# Patient Record
Sex: Female | Born: 1945 | Race: White | Hispanic: No | Smoking: Current some day smoker
Health system: Southern US, Community
[De-identification: ages and names within clinical notes are randomized; demographics above are authoritative.]

## PROBLEM LIST (undated history)

## (undated) DIAGNOSIS — T4145XA Adverse effect of unspecified anesthetic, initial encounter: Secondary | ICD-10-CM

## (undated) DIAGNOSIS — E785 Hyperlipidemia, unspecified: Secondary | ICD-10-CM

## (undated) DIAGNOSIS — S46009A Unspecified injury of muscle(s) and tendon(s) of the rotator cuff of unspecified shoulder, initial encounter: Secondary | ICD-10-CM

## (undated) DIAGNOSIS — F419 Anxiety disorder, unspecified: Secondary | ICD-10-CM

## (undated) DIAGNOSIS — F329 Major depressive disorder, single episode, unspecified: Secondary | ICD-10-CM

## (undated) DIAGNOSIS — M48061 Spinal stenosis, lumbar region without neurogenic claudication: Secondary | ICD-10-CM

## (undated) DIAGNOSIS — B829 Intestinal parasitism, unspecified: Secondary | ICD-10-CM

## (undated) DIAGNOSIS — K224 Dyskinesia of esophagus: Secondary | ICD-10-CM

## (undated) DIAGNOSIS — R7982 Elevated C-reactive protein (CRP): Secondary | ICD-10-CM

## (undated) DIAGNOSIS — K449 Diaphragmatic hernia without obstruction or gangrene: Secondary | ICD-10-CM

## (undated) DIAGNOSIS — F32A Depression, unspecified: Secondary | ICD-10-CM

## (undated) DIAGNOSIS — J449 Chronic obstructive pulmonary disease, unspecified: Secondary | ICD-10-CM

## (undated) DIAGNOSIS — I739 Peripheral vascular disease, unspecified: Secondary | ICD-10-CM

## (undated) DIAGNOSIS — IMO0001 Reserved for inherently not codable concepts without codable children: Secondary | ICD-10-CM

## (undated) DIAGNOSIS — I499 Cardiac arrhythmia, unspecified: Secondary | ICD-10-CM

## (undated) DIAGNOSIS — H269 Unspecified cataract: Secondary | ICD-10-CM

## (undated) DIAGNOSIS — S329XXA Fracture of unspecified parts of lumbosacral spine and pelvis, initial encounter for closed fracture: Secondary | ICD-10-CM

## (undated) DIAGNOSIS — E039 Hypothyroidism, unspecified: Secondary | ICD-10-CM

## (undated) DIAGNOSIS — G5600 Carpal tunnel syndrome, unspecified upper limb: Secondary | ICD-10-CM

## (undated) DIAGNOSIS — K219 Gastro-esophageal reflux disease without esophagitis: Secondary | ICD-10-CM

## (undated) DIAGNOSIS — T8859XA Other complications of anesthesia, initial encounter: Secondary | ICD-10-CM

## (undated) DIAGNOSIS — I2510 Atherosclerotic heart disease of native coronary artery without angina pectoris: Secondary | ICD-10-CM

## (undated) DIAGNOSIS — T884XXA Failed or difficult intubation, initial encounter: Secondary | ICD-10-CM

## (undated) DIAGNOSIS — R790 Abnormal level of blood mineral: Secondary | ICD-10-CM

## (undated) DIAGNOSIS — Z8711 Personal history of peptic ulcer disease: Secondary | ICD-10-CM

## (undated) DIAGNOSIS — R700 Elevated erythrocyte sedimentation rate: Secondary | ICD-10-CM

## (undated) DIAGNOSIS — E0590 Thyrotoxicosis, unspecified without thyrotoxic crisis or storm: Secondary | ICD-10-CM

## (undated) DIAGNOSIS — I10 Essential (primary) hypertension: Secondary | ICD-10-CM

## (undated) DIAGNOSIS — R0600 Dyspnea, unspecified: Secondary | ICD-10-CM

## (undated) HISTORY — PX: APPENDECTOMY: SHX54

## (undated) HISTORY — PX: FRACTURE SURGERY: SHX138

## (undated) HISTORY — DX: Elevated C-reactive protein (CRP): R79.82

## (undated) HISTORY — DX: Dyskinesia of esophagus: K22.4

## (undated) HISTORY — DX: Personal history of peptic ulcer disease: Z87.11

## (undated) HISTORY — PX: CARPAL TUNNEL RELEASE: SHX101

## (undated) HISTORY — DX: Unspecified injury of muscle(s) and tendon(s) of the rotator cuff of unspecified shoulder, initial encounter: S46.009A

## (undated) HISTORY — DX: Hypothyroidism, unspecified: E03.9

## (undated) HISTORY — DX: Depression, unspecified: F32.A

## (undated) HISTORY — DX: Atherosclerotic heart disease of native coronary artery without angina pectoris: I25.10

## (undated) HISTORY — PX: ROTATOR CUFF REPAIR: SHX139

## (undated) HISTORY — PX: NECK SURGERY: SHX720

## (undated) HISTORY — DX: Hyperlipidemia, unspecified: E78.5

## (undated) HISTORY — DX: Gastro-esophageal reflux disease without esophagitis: K21.9

## (undated) HISTORY — PX: BACK SURGERY: SHX140

## (undated) HISTORY — PX: NOSE SURGERY: SHX723

## (undated) HISTORY — DX: Thyrotoxicosis, unspecified without thyrotoxic crisis or storm: E05.90

## (undated) HISTORY — PX: HIP SURGERY: SHX245

## (undated) HISTORY — DX: Anxiety disorder, unspecified: F41.9

## (undated) HISTORY — DX: Unspecified cataract: H26.9

## (undated) HISTORY — PX: CHOLECYSTECTOMY: SHX55

## (undated) HISTORY — DX: Carpal tunnel syndrome, unspecified upper limb: G56.00

## (undated) HISTORY — DX: Elevated erythrocyte sedimentation rate: R70.0

## (undated) HISTORY — DX: Reserved for inherently not codable concepts without codable children: IMO0001

## (undated) HISTORY — DX: Major depressive disorder, single episode, unspecified: F32.9

## (undated) HISTORY — DX: Fracture of unspecified parts of lumbosacral spine and pelvis, initial encounter for closed fracture: S32.9XXA

## (undated) HISTORY — DX: Intestinal parasitism, unspecified: B82.9

## (undated) HISTORY — DX: Diaphragmatic hernia without obstruction or gangrene: K44.9

## (undated) HISTORY — DX: Abnormal level of blood mineral: R79.0

---

## 1999-09-26 ENCOUNTER — Ambulatory Visit (HOSPITAL_COMMUNITY): Admission: RE | Admit: 1999-09-26 | Discharge: 1999-09-26 | Payer: Self-pay | Admitting: Gastroenterology

## 1999-09-26 ENCOUNTER — Encounter: Payer: Self-pay | Admitting: Gastroenterology

## 2002-01-03 ENCOUNTER — Ambulatory Visit (HOSPITAL_BASED_OUTPATIENT_CLINIC_OR_DEPARTMENT_OTHER): Admission: RE | Admit: 2002-01-03 | Discharge: 2002-01-03 | Payer: Self-pay | Admitting: Orthopedic Surgery

## 2004-03-29 ENCOUNTER — Ambulatory Visit (HOSPITAL_COMMUNITY): Admission: RE | Admit: 2004-03-29 | Discharge: 2004-03-29 | Payer: Self-pay | Admitting: Sports Medicine

## 2004-04-26 ENCOUNTER — Inpatient Hospital Stay (HOSPITAL_COMMUNITY): Admission: RE | Admit: 2004-04-26 | Discharge: 2004-04-27 | Payer: Self-pay | Admitting: Neurological Surgery

## 2005-04-21 ENCOUNTER — Ambulatory Visit (HOSPITAL_COMMUNITY): Admission: RE | Admit: 2005-04-21 | Discharge: 2005-04-21 | Payer: Self-pay | Admitting: Neurological Surgery

## 2005-05-04 ENCOUNTER — Inpatient Hospital Stay (HOSPITAL_COMMUNITY): Admission: RE | Admit: 2005-05-04 | Discharge: 2005-05-06 | Payer: Self-pay | Admitting: Neurological Surgery

## 2005-06-13 ENCOUNTER — Ambulatory Visit (HOSPITAL_COMMUNITY): Admission: RE | Admit: 2005-06-13 | Discharge: 2005-06-13 | Payer: Self-pay | Admitting: Neurological Surgery

## 2005-07-28 ENCOUNTER — Ambulatory Visit (HOSPITAL_COMMUNITY): Admission: RE | Admit: 2005-07-28 | Discharge: 2005-07-28 | Payer: Self-pay | Admitting: Otolaryngology

## 2005-07-28 ENCOUNTER — Ambulatory Visit (HOSPITAL_BASED_OUTPATIENT_CLINIC_OR_DEPARTMENT_OTHER): Admission: RE | Admit: 2005-07-28 | Discharge: 2005-07-29 | Payer: Self-pay | Admitting: Otolaryngology

## 2006-04-25 ENCOUNTER — Ambulatory Visit: Payer: Self-pay | Admitting: Anesthesiology

## 2006-05-03 ENCOUNTER — Ambulatory Visit: Payer: Self-pay | Admitting: Pain Medicine

## 2006-05-09 ENCOUNTER — Ambulatory Visit: Payer: Self-pay | Admitting: Pain Medicine

## 2006-05-10 ENCOUNTER — Ambulatory Visit: Payer: Self-pay | Admitting: Pain Medicine

## 2006-05-16 ENCOUNTER — Ambulatory Visit: Payer: Self-pay | Admitting: Pain Medicine

## 2006-05-29 ENCOUNTER — Ambulatory Visit: Payer: Self-pay | Admitting: Physician Assistant

## 2006-06-08 ENCOUNTER — Ambulatory Visit: Payer: Self-pay | Admitting: Physician Assistant

## 2006-06-21 ENCOUNTER — Ambulatory Visit: Payer: Self-pay | Admitting: Pain Medicine

## 2006-07-05 ENCOUNTER — Ambulatory Visit: Payer: Self-pay | Admitting: Pain Medicine

## 2006-08-06 ENCOUNTER — Ambulatory Visit: Payer: Self-pay | Admitting: Physician Assistant

## 2006-09-05 ENCOUNTER — Ambulatory Visit: Payer: Self-pay | Admitting: Physician Assistant

## 2006-09-26 ENCOUNTER — Ambulatory Visit: Payer: Self-pay | Admitting: Physician Assistant

## 2006-10-03 ENCOUNTER — Ambulatory Visit: Payer: Self-pay | Admitting: Pain Medicine

## 2006-10-08 ENCOUNTER — Ambulatory Visit: Payer: Self-pay | Admitting: Physician Assistant

## 2006-11-08 ENCOUNTER — Ambulatory Visit: Payer: Self-pay | Admitting: Physician Assistant

## 2006-11-20 DIAGNOSIS — S329XXA Fracture of unspecified parts of lumbosacral spine and pelvis, initial encounter for closed fracture: Secondary | ICD-10-CM

## 2006-11-20 HISTORY — DX: Fracture of unspecified parts of lumbosacral spine and pelvis, initial encounter for closed fracture: S32.9XXA

## 2006-12-10 ENCOUNTER — Ambulatory Visit: Payer: Self-pay | Admitting: Physician Assistant

## 2007-01-07 ENCOUNTER — Ambulatory Visit: Payer: Self-pay | Admitting: Physician Assistant

## 2007-01-14 ENCOUNTER — Inpatient Hospital Stay: Payer: Self-pay | Admitting: Internal Medicine

## 2007-01-14 ENCOUNTER — Encounter: Admission: RE | Admit: 2007-01-14 | Discharge: 2007-01-14 | Payer: Self-pay | Admitting: Gastroenterology

## 2007-02-04 ENCOUNTER — Ambulatory Visit: Payer: Self-pay | Admitting: Physician Assistant

## 2007-03-04 ENCOUNTER — Ambulatory Visit: Payer: Self-pay | Admitting: Physician Assistant

## 2007-04-04 ENCOUNTER — Ambulatory Visit: Payer: Self-pay | Admitting: Physician Assistant

## 2007-05-06 ENCOUNTER — Ambulatory Visit: Payer: Self-pay | Admitting: Physician Assistant

## 2007-06-04 ENCOUNTER — Ambulatory Visit: Payer: Self-pay | Admitting: Physician Assistant

## 2007-06-11 ENCOUNTER — Encounter: Admission: RE | Admit: 2007-06-11 | Discharge: 2007-06-11 | Payer: Self-pay | Admitting: Gastroenterology

## 2007-07-02 ENCOUNTER — Ambulatory Visit: Payer: Self-pay | Admitting: Physician Assistant

## 2007-08-05 ENCOUNTER — Ambulatory Visit: Payer: Self-pay | Admitting: Physician Assistant

## 2007-09-04 ENCOUNTER — Ambulatory Visit: Payer: Self-pay | Admitting: Physician Assistant

## 2007-10-02 ENCOUNTER — Ambulatory Visit: Payer: Self-pay | Admitting: Physician Assistant

## 2007-12-31 ENCOUNTER — Ambulatory Visit: Payer: Self-pay | Admitting: Physician Assistant

## 2008-04-02 ENCOUNTER — Ambulatory Visit: Payer: Self-pay | Admitting: Physician Assistant

## 2008-07-02 ENCOUNTER — Ambulatory Visit: Payer: Self-pay | Admitting: Physician Assistant

## 2008-09-02 ENCOUNTER — Encounter: Admission: RE | Admit: 2008-09-02 | Discharge: 2008-09-02 | Payer: Self-pay | Admitting: Gastroenterology

## 2008-09-10 ENCOUNTER — Encounter: Admission: RE | Admit: 2008-09-10 | Discharge: 2008-09-10 | Payer: Self-pay | Admitting: Gastroenterology

## 2008-09-30 ENCOUNTER — Ambulatory Visit: Payer: Self-pay | Admitting: Physician Assistant

## 2008-10-27 ENCOUNTER — Emergency Department (HOSPITAL_COMMUNITY): Admission: EM | Admit: 2008-10-27 | Discharge: 2008-10-27 | Payer: Self-pay | Admitting: Family Medicine

## 2008-10-27 ENCOUNTER — Ambulatory Visit (HOSPITAL_COMMUNITY): Admission: RE | Admit: 2008-10-27 | Discharge: 2008-10-27 | Payer: Self-pay | Admitting: Otolaryngology

## 2008-10-29 ENCOUNTER — Encounter (INDEPENDENT_AMBULATORY_CARE_PROVIDER_SITE_OTHER): Payer: Self-pay | Admitting: Gastroenterology

## 2008-10-29 ENCOUNTER — Ambulatory Visit (HOSPITAL_COMMUNITY): Admission: RE | Admit: 2008-10-29 | Discharge: 2008-10-29 | Payer: Self-pay | Admitting: Gastroenterology

## 2008-12-02 ENCOUNTER — Encounter: Admission: RE | Admit: 2008-12-02 | Discharge: 2008-12-02 | Payer: Self-pay | Admitting: Gastroenterology

## 2008-12-07 ENCOUNTER — Ambulatory Visit (HOSPITAL_COMMUNITY): Admission: RE | Admit: 2008-12-07 | Discharge: 2008-12-07 | Payer: Self-pay | Admitting: Otolaryngology

## 2008-12-29 ENCOUNTER — Ambulatory Visit: Payer: Self-pay | Admitting: Physician Assistant

## 2009-02-11 ENCOUNTER — Ambulatory Visit (HOSPITAL_COMMUNITY): Admission: RE | Admit: 2009-02-11 | Discharge: 2009-02-11 | Payer: Self-pay | Admitting: Gastroenterology

## 2009-03-04 ENCOUNTER — Ambulatory Visit (HOSPITAL_COMMUNITY): Admission: RE | Admit: 2009-03-04 | Discharge: 2009-03-04 | Payer: Self-pay | Admitting: Gastroenterology

## 2009-03-25 ENCOUNTER — Ambulatory Visit: Payer: Self-pay | Admitting: Physician Assistant

## 2009-04-29 ENCOUNTER — Ambulatory Visit: Payer: Self-pay | Admitting: Physician Assistant

## 2009-07-01 ENCOUNTER — Ambulatory Visit (HOSPITAL_COMMUNITY): Admission: RE | Admit: 2009-07-01 | Discharge: 2009-07-01 | Payer: Self-pay | Admitting: Gastroenterology

## 2009-07-27 ENCOUNTER — Ambulatory Visit: Payer: Self-pay | Admitting: Physician Assistant

## 2009-10-26 ENCOUNTER — Ambulatory Visit: Payer: Self-pay | Admitting: Physician Assistant

## 2009-12-03 ENCOUNTER — Encounter: Payer: Self-pay | Admitting: Cardiovascular Disease

## 2009-12-08 ENCOUNTER — Encounter: Payer: Self-pay | Admitting: Cardiovascular Disease

## 2010-01-24 ENCOUNTER — Ambulatory Visit: Payer: Self-pay | Admitting: Pain Medicine

## 2010-02-24 ENCOUNTER — Ambulatory Visit: Payer: Self-pay | Admitting: Pain Medicine

## 2010-03-03 ENCOUNTER — Ambulatory Visit (HOSPITAL_BASED_OUTPATIENT_CLINIC_OR_DEPARTMENT_OTHER): Admission: RE | Admit: 2010-03-03 | Discharge: 2010-03-03 | Payer: Self-pay | Admitting: Orthopedic Surgery

## 2010-08-03 ENCOUNTER — Encounter: Payer: Self-pay | Admitting: Cardiovascular Disease

## 2010-08-24 ENCOUNTER — Ambulatory Visit: Payer: Self-pay | Admitting: Cardiovascular Disease

## 2010-08-24 DIAGNOSIS — R000 Tachycardia, unspecified: Secondary | ICD-10-CM | POA: Insufficient documentation

## 2010-08-24 DIAGNOSIS — R0602 Shortness of breath: Secondary | ICD-10-CM | POA: Insufficient documentation

## 2010-08-24 DIAGNOSIS — E785 Hyperlipidemia, unspecified: Secondary | ICD-10-CM | POA: Insufficient documentation

## 2010-11-02 ENCOUNTER — Ambulatory Visit: Payer: Self-pay | Admitting: Cardiovascular Disease

## 2010-11-22 ENCOUNTER — Encounter: Payer: Self-pay | Admitting: Cardiovascular Disease

## 2010-11-25 ENCOUNTER — Encounter: Payer: Self-pay | Admitting: Cardiovascular Disease

## 2010-11-25 ENCOUNTER — Ambulatory Visit: Admission: RE | Admit: 2010-11-25 | Discharge: 2010-11-25 | Payer: Self-pay | Source: Home / Self Care

## 2010-11-25 DIAGNOSIS — E039 Hypothyroidism, unspecified: Secondary | ICD-10-CM | POA: Insufficient documentation

## 2010-11-28 LAB — CONVERTED CEMR LAB
Bilirubin, Direct: 0.1 mg/dL (ref 0.0–0.3)
Indirect Bilirubin: 0.2 mg/dL (ref 0.0–0.9)
LDL Cholesterol: 151 mg/dL — ABNORMAL HIGH (ref 0–99)
Total Bilirubin: 0.3 mg/dL (ref 0.3–1.2)
Total CHOL/HDL Ratio: 4.4
Triglycerides: 149 mg/dL (ref <150)

## 2010-12-11 ENCOUNTER — Encounter: Payer: Self-pay | Admitting: Otolaryngology

## 2010-12-11 ENCOUNTER — Encounter: Payer: Self-pay | Admitting: Gastroenterology

## 2010-12-11 ENCOUNTER — Encounter: Payer: Self-pay | Admitting: Neurological Surgery

## 2010-12-15 ENCOUNTER — Ambulatory Visit: Payer: Self-pay | Admitting: Neurological Surgery

## 2010-12-20 NOTE — Assessment & Plan Note (Signed)
Summary: NP6/AMD   Visit Type:  Initial Consult Primary Provider:  Katherina Right Tate,M.D.  CC:  c/o irreg. heart beats with shortness of breath and has thyroid issues..  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  She reports that she has continued shortness of breath. She has noted an elevated heart rate using her wrist monitor. She was seen recently by her endocrinologist, Dr. Renae Fickle who recommended that she followup with [place] giving her shortness of breath. Her thyroid dose has been recently decreased her last check in late August showed a TSH 0.25. she does not take inhalers.  she denies any chest pain. She walks for 1-1/2 miles on the treadmill and has no symptoms apart from shortness of breath. 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  EKG shows normal sinus rhythm/sinus tachycardia with rate of 102 beats per minute, no significant ST or T wave changes  Preventive Screening-Counseling & Management  Alcohol-Tobacco     Smoking Status: quit  Caffeine-Diet-Exercise     Does Patient Exercise: yes  Current Medications (verified): 1)  Synthroid 88 Mcg Tabs (Levothyroxine Sodium) .... One Tablet Once Daily 2)  Niaspan 1000 Mg Cr-Tabs (Niacin (Antihyperlipidemic)) .... One Tablet Once Daily 3)  Singulair 10 Mg Tabs (Montelukast Sodium) .... Daily 4)  Trazodone Hcl 150 Mg Tabs (Trazodone Hcl) .... One Tablet At Bedtime 5)  Xanax 1 Mg Tabs (Alprazolam) .... As Needed 6)  Bupropion Hcl 100 Mg  Tabs (Bupropion Hcl) .... Daily  Allergies (verified): No Known Drug Allergies  Past History:  Family History: Last updated: 08/24/2010 Mother:living Father: Aortic Aneurysm age 37 deceased  Social History: Last updated: 08/24/2010 Retired--Deputy for [employer] Tobacco Use - Former. Quit four years ago. Alcohol Use - no Regular Exercise - yes--1-1/2 mile daily on the treadmil. Married   Risk Factors: Exercise: yes (08/24/2010)  Risk Factors: Smoking Status: quit (08/24/2010)  Past Medical History: Hyperthyroidism Dysplipidemia Esophageal spasm Hx. 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 Tobacco Use - Former. Quit four years ago. Alcohol Use - no Regular Exercise - yes--1-1/2 mile daily on the treadmil. Married  Smoking Status:  quit Does Patient Exercise:  yes  Review of Systems       The patient complains of dyspnea on exertion.  The patient denies fever, weight loss, weight gain, vision loss, decreased hearing, hoarseness, chest pain, syncope, peripheral edema, prolonged cough, abdominal pain, incontinence, muscle weakness, depression, and enlarged lymph nodes.    Vital Signs:  Patient profile:   65 year old female Height:      64 inches Weight:      177 pounds BMI:     30.49 Pulse rate:   109 / minute BP  sitting:   145 / 85  (left arm) Cuff size:   large  Vitals Entered By: Bishop Dublin, CMA (August 24, 2010 3:57 PM)  Physical Exam  General:  Well developed, well nourished, in no acute distress. Head:  normocephalic and atraumatic Neck:  Neck supple, no JVD. No  masses, thyromegaly or abnormal cervical nodes. Lungs:  Clear bilaterally to auscultation and percussion.Mildly decreased BS throughout Heart:  Non-displaced PMI, chest non-tender; regular rate and rhythm, S1, S2 without murmurs, rubs or gallops. Carotid upstroke normal, no bruit.  Pedals normal pulses. No edema, no varicosities. Abdomen:  Bowel sounds positive; abdomen soft and non-tender without masses, mild obesity Msk:  Back normal, normal gait. Muscle strength and tone normal. Pulses:  pulses normal in all 4 extremities Extremities:  No clubbing or cyanosis. Neurologic:  Alert and oriented x 3. Skin:  Intact without lesions or rashes. Psych:  Normal affect.   Impression & Recommendations:  Problem # 1:  DYSPNEA (ICD-786.05) etiology of her shortness of breath may be multifactorial. She does have a long history of smoking, has underlying tachycardia, is deconditioned. We have suggested that she continue with her exercise, discuss whether she might need pulmonary function tests or inhalers with Dr. Arlana Pouch. We will work on her tachycardia by starting her on a beta blocker.  Her updated medication list for this problem includes:    Metoprolol Tartrate 50 Mg Tabs (Metoprolol tartrate) .Marland Kitchen... Take one tablet by mouth twice a day  Problem # 2:  TACHYCARDIA (ICD-785) Etiology of her tachycardia could be secondary to hyperthyroidism over dose has been recently decreased. She does have underlying significant stressors at home, underlying COPD. She has always had an elevated heart rate as a stress test back in January showed starting/resting heart rate of 100.  We have talked to her extensively about her weight loss supplement that she has been taking recently called phentermine. We have suggested that she either hold the medication or do not by additional supplements as this will certainly increase her heart rate even more.  We will start her on metoprolol tartrate 25 mg b.i.d. titrating up to 50  mg b.i.d. as tolerated.  Problem # 3:  HYPERLIPIDEMIA-MIXED (ICD-272.4) she stopped taking her Lipitor somewhere along the way. We'll try to obtain her most recent lipid panel for our records. We have suggested that she might benefit more from taking Lipitor and holding the Niaspan. We will call her with the suggestion as soon as we have her lab work.  Her updated medication list for this problem includes:    Niaspan 1000 Mg Cr-tabs (Niacin (antihyperlipidemic)) ..... One tablet once daily  Patient Instructions: 1)  Your physician has recommended you make the following change in your medication: START metoprolol 50mg  take 1/2 tab two times a day for 5 days and then increase to 1 whole tab two times a day.  2)  Your physician recommends that you schedule a follow-up appointment in: 1 month  Prescriptions: METOPROLOL TARTRATE 50 MG TABS (METOPROLOL TARTRATE) Take one tablet by mouth twice a day  #60 x 12   Entered by:   Benedict Needy, RN   Authorized by:   Dossie Arbour MD   Signed by:   Benedict Needy, RN on 08/24/2010   Method used:   Electronically to        [facility] [phone]* (retail)       [address] [place], [place]  [address]  Ph: [phone]       Fax: [phone]   RxID:   [id number]

## 2010-12-20 NOTE — Letter (Signed)
Summary: [facility]  [facility]   Imported By: Marylou Mccoy 09/30/2010 10:30:22  _____________________________________________________________________  External Attachment:    Type:   Image     Comment:   External Document
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
## 2011-02-08 LAB — POCT I-STAT, CHEM 8
BUN: 15 mg/dL (ref 6–23)
Chloride: 104 mEq/L (ref 96–112)
Glucose, Bld: 111 mg/dL — ABNORMAL HIGH (ref 70–99)
HCT: 42 % (ref 36.0–46.0)
Hemoglobin: 14.3 g/dL (ref 12.0–15.0)

## 2011-02-28 ENCOUNTER — Other Ambulatory Visit: Payer: Self-pay | Admitting: *Deleted

## 2011-03-07 ENCOUNTER — Other Ambulatory Visit: Payer: Self-pay | Admitting: *Deleted

## 2011-03-10 ENCOUNTER — Encounter: Payer: Self-pay | Admitting: Cardiovascular Disease

## 2011-04-04 NOTE — Op Note (Signed)
NAME:  Benner, ANN                    ACCOUNT NO.:  [id number]   MEDICAL RECORD NO.:  [id number]          PATIENT TYPE:  AMB   LOCATION:  ENDO                         FACILITY:  [facility]   PHYSICIAN:  Bernette Redbird, M.D.   DATE OF BIRTH:  1946/08/31   DATE OF PROCEDURE:  03/04/2009  DATE OF DISCHARGE:                               OPERATIVE REPORT   PROCEDURE:  Upper endoscopy with balloon dilatation of the pylorus.   INDICATION:  This is a 65 year old female with pyloric stricturing  related to inflammatory changes and erosions, associated with clinical  symptoms of nausea and vomiting.  She underwent balloon dilatation of  the pylorus to 10 mm approximately 3 weeks ago and has had quite a bit  of improvement in her vomiting symptoms.   FINDINGS:  Retained food in stomach.  Successful dilatation to 12 mm.   PROCEDURE IN DETAIL:  The nature, purpose, risks of the procedure were  familiar to the patient from prior examination and she provided written  consent.  Sedation was propofol by the anesthesia department.  The  Pentax video endoscope was passed under direct vision, entering the  esophagus without difficulty.  The esophagus was endoscopically normal,  without evidence of reflux esophagitis, Barrett's esophagus, varices,  infection, neoplasia or any ring or stricture.  A small hiatal hernia  was present.   The stomach was entered.  It contained a moderate amount of retained  food debris.  No gastritis, erosions, ulcers, polyps or masses were  observed.  There was minimal exudative change on the surface of the  pyloric ring but no frank erosive changes and certainly no deep  ulcerations were noted.  I was unable to pass the endoscope through the  pyloric channel due to concentric narrowing of the pyloric ring.   We then used the through-the-scope balloon, sizes 10, 11 and 12 mm  diameters.  This was passed through the scope into the pylorus and  inflated to 10 mm for 1 minute, 11  mm for 1 minute and 12 mm for 2  minutes.  There was a little bit of mucosal hemorrhage in association  with this.  Following the procedure, it appeared there was a small  mucosal rent in the posteroinferior aspect of the pyloric ring, without  evidence of frank perforation.  I was then able to pass the adult  endoscope through the pyloric ring with minimal resistance, thereby  indicating that successful dilatation had been accomplished.  The  duodenum looked normal.  Retroflexion of the cardia of the stomach  looked normal apart from the retained food.  The stomach was deflated.  The scope was removed from the patient who tolerated the procedure well  and without any evident complications.   IMPRESSION:  1. Pyloric stenosis dilated to 12 mm by through-the-scope balloon      technique as described above.  2. 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   PLAN:  Clinical followup of symptoms.  Continue antipeptic therapy.  I  will probably arrange to see the patient back in the office in a month  or so to monitor her condition and decide whether additional dilatation  is necessary.           ______________________________  Bernette Redbird, M.D.     RB/MEDQ  D:  03/04/2009  T:  03/04/2009  Job:  [id number]   cc:   Dewaine Oats  Fax: [phone]

## 2011-04-04 NOTE — Op Note (Signed)
[name], [name]                  ACCOUNT NO.:  [id number]   MEDICAL RECORD NO.:  [id number]          PATIENT TYPE:  AMB   LOCATION:  ENDO                         [facility]ACILITY:  [facility]   PHYSICIAN:  Bernette Redbird, M.D.   DATE OF BIRTH:  [birth year]   DATE OF PROCEDURE:  10/29/2008  DATE OF DISCHARGE:                               OPERATIVE REPORT   PROCEDURE:  Upper endoscopy with biopsies.   INDICATIONS:  A 65 year old female with dilation of bile duct and  pancreatic duct; therefore, the question of the possibility of an  ampullary tumor.   FINDINGS:  Erosive esophagitis and pyloric stenosis with erosive changes  of the pylorus, but no evident ampullary tumor.   PROCEDURE:  The nature, purpose, risks of the procedure had been  discussed with the patient who provided written consent, as an  outpatient to the [facility]. We choose to do this  procedure as a duodenoscopy to best visualize the ampulla.  Sedation was  propofol and ketamine per Anesthesia.   The Pentax video duodenoscope was passed blindly into the esophagus,  initially encountering difficulty in getting it properly oriented within  the throat but once I did, it passed easily into the esophagus.   The scope was advanced into the stomach which contained a small bilious  residual.   It was immediately noted that the rim of the pylorus was eroded with a  collar of exudate, roughly 1 cm across all the way around the pylorus in  a circumferential fashion.  Efforts to pass the duodenal scope through  the pylorus were unsuccessful and there was a moderate amount of  friability and therefore some bleeding.  The remainder of the stomach  including retroflexion of the cardia was normal.   The duodenoscope was removed and the patient was endoscoped using the  standard forward viewing endoscope.  This was also unable to traverse  the pylorus, but I used it to obtain biopsies from the area  circumferentially  around the pylorus.  Upon withdrawal of that scope, I  noted that there were serpentine erosive changes in the last couple of  centimeters of the esophagus, suggestive of reflux esophagitis, so  biopsies were obtained from that area as well.   Finally, I passed the forward viewing pediatric upper endoscope under  direct vision.  This was able to traverse the pylorus without difficulty  and I was able to inspect the second portion of the duodenum and  probably the proximal third portion of the duodenum quite thoroughly.  In doing so, I did not see any evidence of a mass projecting into the  duodenal lumen, although I was never able to clearly identify the major  papilla.   The scope was then removed from the patient who tolerated the procedure  well and without any obvious complications.  There did appear to be some  oral trauma from passage of the duodenoscope.   IMPRESSION:  1. Abnormal biliary tract x-ray without obvious ampullary tumor seen      on this examination (793.3).  2. Erosive gastritis in  the pyloric region, path pending, no obvious      tumor.  3. Distal erosive esophagitis compatible with reflux, biopsied.  4. No large gastric residual of retained food as had been noted on a      previous endoscopy.   PLAN:  1. Await pathology results.  2. Initiate PPI therapy with Prilosec over-the-counter or any PPI of      her choice.  A prescription will be provided for generic Omeprazole      20 mg p.o. q. a.m.           ______________________________  Bernette Redbird, M.D.     RB/MEDQ  D:  10/29/2008  T:  10/29/2008  Job:  [id number]   cc:   Dewaine Oats  Fax: [phone]

## 2011-04-04 NOTE — Op Note (Signed)
NAME:  [name], [name]                    ACCOUNT NO.:  [id number]   MEDICAL RECORD NO.:  [id number]          PATIENT TYPE:  AMB   LOCATION:  ENDO                         [facility]ACILITY:  [facility]   PHYSICIAN:  Bernette Redbird, M.D.   DATE OF BIRTH:  [birth year]   DATE OF PROCEDURE:  07/01/2009  DATE OF DISCHARGE:                               OPERATIVE REPORT   PROCEDURE:  Upper endoscopy with balloon dilatation of pylorus.   INDICATION:  A 65 year old female with known history of pyloric channel  stenosis, last dilated to 12 mm 4 months ago, now with recurring reflux  regurgitation and vomiting symptoms.   FINDINGS:  Dilatation of stenotic pylorus to 15 mm.  Moderate gastric  residual.   PROCEDURE:  The patient was familiar with the procedure from prior  examination, and she provided written consent.  Sedation was propofol  per anesthesia, using general endotracheal anesthesia with intubation so  as to protect the airway.   The Pentax video endoscope was passed alongside the endotracheal tube  and entered the esophagus without significant difficulty.  The distal  esophagus had some mild erosive changes consistent with reflux  esophagitis.  The stomach was entered and noted to contain a moderate  amount of retained food debris and bile.  The visualized gastric mucosa  was unremarkable, without evidence of gastritis, erosions, ulcers,  polyps or masses, and a retroflexed view of the cardia was unremarkable.   The pylorus was somewhat deformed and stenotic, but with gentle  pressure, I was able to get the 10-mm endoscope to pass through it into  a normal appearing third portion of the duodenum.  The second portion of  the duodenum was a little bit clamped down but not obviously  extrinsically compressed, effaced, or inflamed.   The balloon dilatation was performed using the esophageal balloon,  initially inflated to 12 mm for 2 minutes and then to 15 mm for 2  minutes.  After doing this, the  scope passed more readily into the  duodenum, with less resistance at the level of the pyloric channel.   The scope was removed from the patient.  The patient tolerated the  procedure well, and there were no apparent complications.   IMPRESSION:  1. Pyloric stenosis and deformity as described above.  2. Dilatation to 15 mm of the pyloric channel through the scope      balloon.  3. Erosive distal esophagitis, probably secondary to reflux.  4. Gastric retention with some degree of food debris at the start of      the procedure.   PLAN:  Clinical follow-up.           ______________________________  Bernette Redbird, M.D.     RB/MEDQ  D:  07/01/2009  T:  07/01/2009  Job:  [id number]   cc:   Dewaine Oats  Fax: [phone]

## 2011-04-04 NOTE — Op Note (Signed)
NAME:  [name], [name]                    ACCOUNT NO.:  [id number]   MEDICAL RECORD NO.:  [id number]          PATIENT TYPE:  AMB   LOCATION:  ENDO                         FACILITY:  [facility]   PHYSICIAN:  Bernette Redbird, M.D.   DATE OF BIRTH:  1946-04-03   DATE OF PROCEDURE:  02/11/2009  DATE OF DISCHARGE:                               OPERATIVE REPORT   PROCEDURE:  Upper endoscopy with balloon dilatation of the esophagus.   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   FINDINGS:  Moderate chronic gastric residual with retained vegetable  debris.  Disordered gastric peristalsis.  Bile reflux.  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   DESCRIPTION OF PROCEDURE:  The nature, purpose and risks of the  procedure had been discussed with the patient, who provided written  consent and came as an outpatient to [facility], where  she was given propofol sedation by the anesthesia department.  The  standard adult Pentax adult video colonoscope was passed under direct  vision.  The vocal cords looked normal.  The esophagus was readily  entered and was normal in its entirety.  No significant hiatal hernia  was appreciated.   The stomach contained a moderate bilious residual with multiple kernels  of corn constituting a gastric residual.  The stomach was free of  inflammatory changes, with particular reference to the pyloric region  which had had previously severe erosive changes present on her endoscopy  3 months ago.  She also had resolution of her distal esophagitis noted  on her previous endoscopy.   The adult endoscope could not pass through her pyloric channel, which  was severely stenosed, so a dilating balloon was threaded through the  scope into the pyloric orifice and inflated in a sequential fashion to  8, 9, and 10 mm, each time for a minute.  There  was a little bit of  hemorrhage.  Thereafter, I tried to get the adult endoscope to pass  through the pylorus but it could not quite pop through, so we dilated  for another minute at 10 mm but still we were unable to get the scope to  get through that area.  Therefore, the adult endoscope was removed and  the patient was re-endoscoped using the pediatric Pentax endoscope, and  this was able to pop through the pyloric region with just minimal  resistance.  The proximal duodenum looked normal.  Exam of the pyloric  channel showed some macerated tissue but no evidence of obvious  perforation or significant bleeding.  The scope was then removed from  the patient, who tolerated the procedure well.  There were no apparent  complications.   IMPRESSION:  1. Resolution of previous esophagitis and prepyloric gastritis.  2. Retained food and bile within the stomach.  3. Severe pyloric stenosis, dilated to 10 mm with balloon, but unable      to pass the 10-mm adult endoscope through it, whereas the pediatric  upper endoscope did pass through okay.   PLAN:  Repeat endoscopy with repeat balloon dilatation, hopefully to a  larger diameter, approximately a month from now.  Continue current  medical therapy since it does seem to be controlling the inflammatory  aspect of her problem.           ______________________________  Bernette Redbird, M.D.     RB/MEDQ  D:  02/11/2009  T:  02/11/2009  Job:  [id number]   cc:   Dewaine Oats  Fax: [phone]

## 2011-04-07 NOTE — Op Note (Signed)
NAMESWAYZE, KOZUCH                  ACCOUNT NO.:  [id number]   MEDICAL RECORD NO.:  [id number]          PATIENT TYPE:  AMB   LOCATION:  DSC                          FACILITY:  [facility]   PHYSICIAN:  Lucky Cowboy, MD         DATE OF BIRTH:  12-31-1945   DATE OF PROCEDURE:  07/28/2005  DATE OF DISCHARGE:                                 OPERATIVE REPORT   PREOPERATIVE DIAGNOSIS:  Left vocal cord paresis, cricopharyngeal achalasia.   POSTOPERATIVE DIAGNOSIS:  Left vocal cord paresis, cricopharyngeal  achalasia.   PROCEDURES:  1.  Suspension micro-direct laryngoscopy with lipo injection of bilateral      vocal cords.  2.  Direct injection of the cricopharyngeus with Botox toxin (20 units).  3.  Harvest of right-sided abdominal fat by liposuction.   SURGEON:  Lucky Cowboy, M.D.   ASSISTANT SURGEON:  Karren Burly D. Jenne Pane, M.D.   ANESTHESIA:  General endotracheal anesthesia.   ESTIMATED BLOOD LOSS:  Less than 20 mL.   SPECIMENS:  None.   COMPLICATIONS:  None.   INDICATIONS:  This patient is a 65 year old female who underwent a five-  level the C3-7 revision cervical disk surgery June 15.  Since the surgery,  her voice has been very hoarse.  Examination in the office on August 1  revealed a paralyzed left vocal cord in the lateralized position.  The  patient was relatively aphonic and having aspiration symptoms.  For this  reason, left vocal cord medialization laryngoplasty was planned.  However, in the preoperative area, the patient was noted to have marked  improvement in voice.  For this reason, fiberoptic laryngoscopy was  performed and did reveal some return of vocal cord function on the left  side.  The patient did note improvement in voice over the past 2-3 days.  Aspiration was still a concern with ongoing symptoms.  The patient was noted  to have a considerable muscle tension dysphonia by fiberoptic exam today.  The options were discussed with the patient.  It was elected to proceed  with  micro-direct lipo full injection of the vocal cords to help augment the cord  volume while cord function is returning.  This would also decrease  aspiration and hopefully minimize vocal cord dystonia with the excessive  muscle tension pattern going on to help compensate for the vocal cord  weakness.  Additionally, the patient is demonstrating aspiration from  excessive hypertonicity of the cricopharyngeus muscle and for this reason,  Botox is injected to the cricopharyngeus muscle.  Additional risks from this procedure with the minor risk of vocal cord  paralysis were discussed.  The patient is in agreement and would like to  proceed.  Further concern was mentioned that the patient may be experiencing  some pharyngeal plexus impairment with decreased sensation in the area from  the surgery on the cervical disk which will take time to recover, and these  above procedures will also help her compensate for this while the recovery  is ongoing.   PROCEDURE:  The patient was taken to the operating room and  placed on the  table in the supine position.  She was then placed under general  endotracheal anesthesia and the table rotated counterclockwise 90 degrees.  The right inferior quadrant of the abdomen was prepped with Betadine and  draped in the usual sterile fashion.  A previous incision was then used to  harvest fat.  A #15 blade was used to make a 2 cm incision and subcutaneous  abdominal fat harvested with the liposuction cannula, which was collected in  a Lukens trap.  Small morcellized fat was then placed into the laryngeal  injection done and prepared for injection with a small needle.   At this point, the Weerda hypopharyngeal scope was placed into the  postcricoid area with the posterior portion of the cricopharyngeus muscle  the identified first by passing into the esophagus and then retracting,  identifying the band of tissue.  An injection using 10 units on each side of   the posterior portion of cricopharyngeus was then performed with a small  laryngeal injecting needle.  Once this was performed, Lillia Mountain scope was  removed and a Dedo laryngoscope was placed into the endolarynx and suspended  on the Lewy suspension arm.  The operating microscope was then visualized  down the laryngoscope.  Previously, an upper teeth guard was placed to  protect the incisors.  Once this was performed, the lipo injection was  performed in two locations, mid-left cord at the lateral most margin of the  vocal cord at its connection in the ventricle as well as a more posterior  location as well.  The right vocal cord was noted to be atrophic and to help  with closure, a small amount of fat was also injected into the right vocal  cord as well.  Lidocaine was sprayed onto the vocal cords to minimize  laryngospasm upon awakening.  The laryngoscope was removed, as was the mouth  guard.  There was no damage to the teeth or soft tissues.  The table was  rotated clockwise 90 degrees to its original position and the patient  awakened from anesthesia.  She was taken to the Post Anesthesia Care Unit in  stable condition.  There were no complications.  She will be observed  overnight in the [facility] postoperative care center.      Lucky Cowboy, MD  Electronically Signed     SJ/MEDQ  D:  07/28/2005  T:  07/28/2005  Job:  [id number]   cc:   Stefani Dama, M.D.  [address]  [place]  [place] [address]  Fax: [phone]   Dr. Arlana Pouch

## 2011-04-07 NOTE — Op Note (Signed)
[facility]  Patient:    [name], [name] Visit Number: [id number] MRN: [id number]          Service Type: Attending:  Loreta Ave, M.D. Dictated by:   Loreta Ave, M.D. Proc. Date: 01/03/02                             Operative Report  PREOPERATIVE DIAGNOSES: 1. Carpal tunnel syndrome, right wrist. 2. Compression of ulnar nerve, Guyons canal, right wrist.  POSTOPERATIVE DIAGNOSES: 1. Carpal tunnel syndrome, right wrist. 2. Compression of ulnar nerve, Guyons canal, right wrist.  OPERATIVE PROCEDURE: 1. Carpal tunnel release, right wrist. 2. Decompression of ulnar nerve and artery at [facility] canal, right wrist.  SURGEON:  Loreta Ave, M.D.  ASSISTANT:  Arlys John D. Petrarca, P.A.-C.  ANESTHESIA:  IV regional.  SPECIMENS:  None.  CULTURES:  None.  COMPLICATIONS:  None.  DRESSING:  Self-compressive with bulky hand dressing and splint.  DESCRIPTION OF PROCEDURE:  The patient was brought to the operating room and placed on the operating table in the supine position.  After adequate anesthesia had been obtained, the right arm was prepped and draped in the usual sterile fashion.  A curved incision slightly ulnar to the thenar eminence.  This was curved around the area of Guyons canal with a slight curve, and then extended up into the distal forearm proximally.  The skin and subcutaneous tissue divided.  Careful dissection of the skin.  Retinaculum over the carpal canal identified and incised under direct visualization from forearm fascia proximally and the palmaris distally.  Completely decompressing the carpal tunnel and protecting the nerve.  Digital branches and bony branches were identified, protected, and decompressed.  Moderate, but not marked constriction of the nerve.  Through the same skin incision, we then extended over lateral to the carpal canal.  Retinaculum over Guyons canal carefully identified.  Ulnar nerve artery and  venous structures identified proximal to this and carefully decompressed throughout the entire area of Guyons canal.  All fascial constriction on the nerve completely decompressed throughout the entire course, extending up into the bifurcation of the ulnar nerve and curve of the ulnar artery over into the palmar arch at the distal aspect.  Moderate constriction of the nerve within this area that improved after decompression.  One side showed we had decompression of both areas.  The wounds were irrigated.  The skin closed with nylon.  Margins of the wound injected with Marcaine.  A sterile compressive dressing with bulky hand dressing and splint applied.  Anesthesia reversed.  Brought to the recovery room.  Tolerated surgery well with no complications. Dictated by:   Loreta Ave, M.D. Attending:  Loreta Ave, M.D. DD:  01/03/02 TD:  01/04/02 Job: [id number]

## 2011-04-07 NOTE — Op Note (Signed)
[name], [name]                  ACCOUNT NO.:  [id number]   MEDICAL RECORD NO.:  [id number]          PATIENT TYPE:  INP   LOCATION:  5035                         FACILITY:  [facility]   PHYSICIAN:  [name], M.D.  DATE OF BIRTH:  08/28/1946   DATE OF PROCEDURE:  05/04/2005  DATE OF DISCHARGE:                                 OPERATIVE REPORT   PREOPERATIVE DIAGNOSES:  1.  Pseudoarthrosis, C4-5.  2.  Spondylosis, C3-4, C6-7, with cervical radiculopathy.   POSTOPERATIVE DIAGNOSES:  1.  Pseudoarthrosis, C4-5.  2.  Spondylosis, C3-4, C6-7, with cervical radiculopathy.   PROCEDURES:  Anterior cervical decompression, C3-4, C4-5, C6-7; arthrodesis  with structural allograft and [name] plate fixation, iliac crest bone  graft used from right anterosuperior iliac crest.   SURGEON:  [name], M.D.   FIRST ASSISTANT:  [name], M.D.   ANESTHESIA:  General endotracheal.   INDICATIONS:  [name] is a 65 year old individual who many years ago had  undergone an anterior cervical decompression arthrodesis at the C5-C6 level  with iliac crest bone graft from the left crest.  She did well until about a  year and a half ago, when she presented with recurrent neck pain and was  found to have severe spondylitic disease at C4-5 with evidence of cervical  radiculopathy.  She underwent an anterior decompression arthrodesis using  [name] plate and allograft.  Postoperatively, the patient did well for a  period of time; however, over the past number of months, she developed  recurrent pain in her neck, shoulder and arms and further workup including  an MRI demonstrated that there was indeed a pseudoarthrosis at the C4-C5  level with some gross motion on flexion and there were advanced spondylitic  changes at C3-4 and also at C6 and C7.  After careful consideration and  observation that her radicular pattern seemed to involve the C7 nerve root  and she was complaining of headache  which may likely have been coming from  the C3-4 area, it was advised that she undergo revision of the  pseudoarthrosis in addition to decompression arthrodesis at C3-4 and  C6-7;  she is now taken to the operating room for that procedure.   PROCEDURE:  The patient was brought to the operating room supine on a  stretcher.  After the smooth induction of general endotracheal anesthesia,  she was placed in 5 pounds of halter traction.  The neck was prepped with  [name] and draped in a sterile fashion, as was the right anterosuperior  iliac crest region.  The skin was infiltrated with 5-10 mL of lidocaine with  epinephrine 1:100,000 mixed 50/50 with  0.5% Marcaine.  The skin was incised  in the bed of the old incision and this was carried down through the  platysma.  The plane between the sternocleidomastoid and the strap muscles  was dissected bluntly until the prevertebral space was reached.  the plate  was identified first and this was then excised after being dissected of  significant scar tissues and adhesions.  The plate itself was noted  be loose  on the ventral aspect of the vertebral bodies.  The screws were also loose,  particularly in C5 where they were poorly directed.  Next, the area of the  disk space was explored and a combination of rongeurs was used to remove  some fascia overlying the disk space.  There was an irregular contour to the  malunion and this was then drilled out with a high-speed drill and a 2.3-mm  dissecting tool.  The dissection was carried down to the posterior  longitudinal ligament, this area was decompressed again and both lateral  recesses were decompressed.  The endplates were prepared using a high-speed  drill and a 2.3-mm dissecting tool.  Attention was then turned to C3-4 where  a diskectomy was performed, removing a substantial quantity of severely  degenerated disk material.  Here, bilateral uncinate process spurs were  encountered in addition to  some osteophytic material from the inferior  margin of the body of C3; this was all removed.  Once this area was  decompressed, attention was turned to C6 and C7 where after some dissection  of the muscular tissues, during which time there was noted be a firm fibrous  mass which was felt to be a portion of the thyroid gland which may have had  a thyroid nodule, this area was dissected so that it could be mobilized, but  then left in intact.  The dissection was carried down to the C6-7 space,  which was opened with a 15 blade and again a combination of Kerrison  rongeurs was used then used to decompressed the interspace, first by  removing large ventral osteophytes and then by cleaning out the disk space.  The uncinate spurs here were smaller.  Dissection was carried into the  lateral gutters and once this area was decompressed, then hemostasis was  achieved in the epidural space and the area was prepared for grafting by  drilling down the endplates with a high-speed drill and 2.3-mm dissecting  tool.  The right anterosuperior iliac crest was then opened with a  transverse incision over this area after infiltrating the skin here with the  same mixture of lidocaine and epinephrine, and the bone of the  anterosuperior iliac crest was identified.  Monopolar cautery was used to  strip the fascia off of either side of the crest and dissect this in a  subperiosteal fashion.  When adequate arch of the bone was obtained, then a  7-mm [name] saw was used to harvest 3 consecutive grafts, which were  released from the iliac crest using a small curved osteotome.  The grafts  were then laid aside.  Hemostasis in the bleeding bone was obtained with  some Gelfoam soaked in thrombin which was packed into the bleeding edges,  but then removed.  The area was copiously irrigated with antibiotic  irrigating solution.  When adequate hemostasis was obtained, the fascia overlying the iliac crest was closed with a  #1 Vicryl in interrupted  fashion, 2-0 Vicryl was used in the subcutaneous tissues and 3-0 Vicryl  subcuticularly.  The bones were then shaved to their final size and shape,  and the largest of the grafts was placed in the C6-C7 space.  The next  smallest graft was placed into C4-5 and the most superior portion of the  graft was placed at the C3-C4 level.  Once the grafts were placed and  countersunk appropriately, the ventral aspect of the vertebral bodies was  sized for an appropriate plate,  which was felt to be 58 mm in length.  This  was then secured with 14-mm locking screws, variable screws being used in  C3, fixed-angle screws in C7, C6, C5 and C4.  The construct was checked with  several radiographs to identify the top portion and the bottom portion  individually.  It  was felt to be adequately positioned.  Soft tissue  hemostasis was then obtained and because of the length of the dissection, a  small Jackson-Pratt drain was also used and brought out through a separate  stab incision inferior and lateral to the incision.  The platysma was then  closed with 3-0 Vicryl in an interrupted fashion, 3-0 Vicryl was used to  close the subcuticular tissues, Dermabond was placed on the skin.  The  patient tolerated procedure well and was returned to recovery room in stable  condition.       HJE/MEDQ  D:  05/04/2005  T:  05/05/2005  Job:  [id number]

## 2011-04-07 NOTE — Op Note (Signed)
NAME:  Hausner, Trang A                            ACCOUNT NO.:  [id number]   MEDICAL RECORD NO.:  [id number]                   PATIENT TYPE:  INP   LOCATION:  3014                                 FACILITY:  [facility]   PHYSICIAN:  Stefani Dama, M.D.               DATE OF BIRTH:  1946/01/19   DATE OF PROCEDURE:  04/26/2004  DATE OF DISCHARGE:                                 OPERATIVE REPORT   PREOPERATIVE DIAGNOSIS:  Cervical spondylosis with myelopathy, C4-C5.  Left  cervical radiculopathy.   POSTOPERATIVE DIAGNOSIS:  Cervical spondylosis with myelopathy, C4-C5.  Left  cervical radiculopathy.   OPERATION PERFORMED:  Anterior cervical decompression and arthrodesis C4 and  C5.  Structural allograft and Aphatek plate fixation.   SURGEON:  Stefani Dama, M.D.   ASSISTANT:  Hilda Lias, M.D.   ANESTHESIA:  General endotracheal.   INDICATIONS FOR PROCEDURE:  [name] is a 65 year old individual who has  had a previous anterior decompression arthrodesis at C5-C6.  She has  developed significant neck, shoulder and arm pain with dysesthesias into her  lower extremities and also in her upper extremities.  She has a left  cervical radiculopathy in a C5 distribution.  MRI demonstrates that she has  prominent spondylosis at the level of C4-C5 with a large bony osteophyte off  to the left side causing cord compression and obliteration of her exit  foramen of the C6 nerve root.   DESCRIPTION OF PROCEDURE:  The patient was brought to the operating room and  placed on the table in supine position.  After smooth induction of general  endotracheal anesthesia, she was placed in five pounds of halter traction.  Her neck was shaved, prepped with DuraPrep and draped in a sterile fashion.  An elliptical incision was made around the previous scar and this was  excised.  The dissection was taken down to the prevertebral tissues where  the first identifiable disk space was noted to be that of C4-C5  on the  localizing radiograph.  The longus coli muscle was stripped off of either  side of midline and a self-retaining Caspar retractor was placed in the  wound.  Diskectomy was then performed using a combination of curets and  rongeurs to remove the ventral aspect of the disk and expose the disk space.  The rongeurs were then used to expose the posterior aspect of the disk space  and bony spur overlying the inferior margin of the C4 was then taken down  with a high speed air drill and 2.3 mm dissecting tool.  Ultimately through  dissection of the posterior longitudinal ligament, the common dural tube was  identified.  On the left side there was found to be a large osteophytic spur  hanging from the inferior margin of the body of C4 and this was associated  with a free fragment of bone that appeared to be like a  sesamoid bone on the  ligament itself.  This was removed and allowed for immediate decompression  of the left side.  Further dissection revealed some soft tissue that was  adherent to the common dural tube and the left C5 nerve root.  This was  dissected free.  Hemostasis in the soft tissues was obtained meticulously.  The right sided exit foramen was similarly dissected.  The end plates were  then ground smooth with a high speed drill and a 4 mm barrel bit.  The  interspace was then sized and it was felt that a 7 mm graft would fit best.  This was then shaved and contoured to the appropriate dimension of the disk  space.  It was filled with Grafton putty that was placed into the graft  itself along with some of the patient's own bone fragments from the  dissection.  The graft was then tamped into the interspace and then  countersunk appropriately.  A 18 mm standard size Alphatek plate was then  affixed with four locking 14 mm nonvariable angle screws.  The system was  then locked into position.  Localizing radiograph identified good position  of the construct.  The patient's   wound was then checked for hemostasis carefully.  The platysma was then  closed with 3-0 Vicryl in interrupted fashion.  3-0 Vicryl was used  subcuticularly.  The patient tolerated the procedure well and was returned  to the recovery room in stable condition.                                               Stefani Dama, M.D.    Merla Riches  D:  04/26/2004  T:  04/26/2004  Job:  [id number]

## 2011-05-26 ENCOUNTER — Encounter: Payer: Self-pay | Admitting: Cardiovascular Disease

## 2011-05-26 ENCOUNTER — Ambulatory Visit (INDEPENDENT_AMBULATORY_CARE_PROVIDER_SITE_OTHER): Payer: Medicare Other | Admitting: Cardiovascular Disease

## 2011-05-26 DIAGNOSIS — R5381 Other malaise: Secondary | ICD-10-CM

## 2011-05-26 DIAGNOSIS — R0989 Other specified symptoms and signs involving the circulatory and respiratory systems: Secondary | ICD-10-CM

## 2011-05-26 DIAGNOSIS — R5383 Other fatigue: Secondary | ICD-10-CM

## 2011-05-26 DIAGNOSIS — E785 Hyperlipidemia, unspecified: Secondary | ICD-10-CM

## 2011-05-26 DIAGNOSIS — R0602 Shortness of breath: Secondary | ICD-10-CM

## 2011-05-26 DIAGNOSIS — F17200 Nicotine dependence, unspecified, uncomplicated: Secondary | ICD-10-CM

## 2011-05-26 DIAGNOSIS — E039 Hypothyroidism, unspecified: Secondary | ICD-10-CM

## 2011-05-26 MED ORDER — METOPROLOL TARTRATE 50 MG PO TABS: 25.0000 mg | ORAL_TABLET | Freq: Every day | ORAL | Status: DC
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
## 2011-05-26 NOTE — Patient Instructions (Addendum)
We will decrease the metoprolol to 1/2 tab of the 25 mg in the Am Please call us if you have new issues that need to be addressed before your next appt.  We will call you for a follow up Appt

## 2011-05-27 DIAGNOSIS — R5383 Other fatigue: Secondary | ICD-10-CM | POA: Insufficient documentation

## 2011-05-27 DIAGNOSIS — F17200 Nicotine dependence, unspecified, uncomplicated: Secondary | ICD-10-CM | POA: Insufficient documentation

## 2011-05-27 NOTE — Assessment & Plan Note (Signed)
We will cut back on her metoprolol given her fatigue. We have suggested she take 25 mg one in the morning, none in the evening. She also probably has underlying depression.

## 2011-05-27 NOTE — Assessment & Plan Note (Signed)
We have  Asked her to continue smoking cessation techniques. She will continue to work on this.

## 2011-05-27 NOTE — Assessment & Plan Note (Signed)
Continue on her current statin

## 2011-05-27 NOTE — Assessment & Plan Note (Signed)
I suspect her fluttering and tachycardia that is very periodic is secondary to underlying stress. I've also asked her to cut back on her energy drinks and she drinks 3 a day on average. We will continue on low-dose metoprolol

## 2011-07-11 ENCOUNTER — Telehealth: Payer: Self-pay

## 2011-07-11 MED ORDER — ATORVASTATIN CALCIUM 20 MG PO TABS: 20.0000 mg | ORAL_TABLET | Freq: Every day | ORAL | Status: DC

## 2011-07-11 NOTE — Telephone Encounter (Signed)
Refill for atorvastatin.

## 2011-08-04 ENCOUNTER — Other Ambulatory Visit: Payer: Self-pay | Admitting: Gastroenterology

## 2011-08-24 LAB — POCT I-STAT, CHEM 8
Glucose, Bld: 85 mg/dL (ref 70–99)
HCT: 34 % — ABNORMAL LOW (ref 36.0–46.0)
Hemoglobin: 11.6 g/dL — ABNORMAL LOW (ref 12.0–15.0)
Potassium: 3.8 mEq/L (ref 3.5–5.1)

## 2011-09-12 ENCOUNTER — Ambulatory Visit: Payer: Self-pay | Admitting: Internal Medicine

## 2011-11-06 ENCOUNTER — Ambulatory Visit: Payer: Self-pay | Admitting: Otolaryngology

## 2011-11-20 ENCOUNTER — Telehealth: Payer: Self-pay | Admitting: *Deleted

## 2011-11-20 NOTE — Telephone Encounter (Signed)
Requesting clearance for upcoming shoulder surgery. Ortho asking for clearance letter and stress test. Pt last seen 05/2011 H/O SOB, palps, tachycardia, HLD, "stinging in chest" at last ov and family stress related. Old EKG showed NSR HR 82 no ST or T wave changes. Per note, her fluttering and tachycardia that is very periodic thought to be secondary to underlying stress. Please advise if we can send clearance, does she need stress test?

## 2011-11-27 ENCOUNTER — Encounter: Payer: Self-pay | Admitting: *Deleted

## 2011-11-27 NOTE — Telephone Encounter (Signed)
Clearance sent 

## 2011-11-27 NOTE — Telephone Encounter (Signed)
No stress test needed. Low risk surgery

## 2011-12-04 NOTE — H&P (Signed)
[name]/[facility] [address]   [address] [place], [place] [address] [phone] A Division of [facility] Loreta Ave, M.D.     Robert A. Thurston Hole, M.D.     Lunette Stands, M.D. Eulas Post, M.D.    Buford Dresser, M.D. Estell Harpin, M.D. Ralene Cork, D.O.          Genene Churn. Barry Dienes, PA-C            Kirstin A. Shepperson, PA-C [place], OPA-C   RE: [name], [name]   [id number]      DOB: Apr 15, 1946 PROGRESS NOTE: 10-31-11 Chief complaint: right wrist and right shoulder pain. History of present illness: 66 year old white female status post right shoulder arthroscopy with debridement and open rotator cuff repair, distal clavicle excision and acromioplasty several years ago. Her shoulder was doing well until a month ago when she was helping to lift her mother in her wheelchair going through a doorway and she felt a sharp pain in her shoulder. She has had some discomfort with overhead activity and reaching behind her back mostly she feels weak with going overhead and bringing her arm down. No cervical spine or radicular component. Pain wakes her at night. She's had right wrist pain for a couple months. She tripped falling onto her hand and thought it would improve on its own. Pain with wrist motion and lifting objects. She's status post right carpal tunnel release and trigger thumb release 03/03/10. She's been doing well from the surgery. Current medications: Toprol, Bupropion, Trazodone, Atorvastatin, Alprazolam, Montelukast, Niaspan, Synthroid, Combivent.  No known drug allergies.  Past medical/surgical history: hypertension, hypercholesterolemia, COPD, hypothyroid depression right carpal tunnel release right shoulder arthroscopy and neck surgery. Family history positive for arthritis. Social history she's married and retired. Admits smoking one pack per day denies alcohol use. Review of systems: unremarkable.  EXAMINATION: Alert  and oriented x3 in no acute distress. Cervical spine unremarkable. Right shoulder has good range of motion with discomfort overhead positive impingement. Some pain and weakness with supraspinatus resistance. Negative drop arm negative apprehension. Right wrist has good range of motion she's tender over the dorsal aspect of the snuff box. No swelling or bruising. Negative Tinel's and Phalen's. She's neurovascularly intact. Skin warm and dry. No increase in respiratory effort.   X-RAYS: Right shoulder shows type I acromion and adequate distal clavicle excision no bony overgrowth no acute changes. Right wrist AP lateral and scaphoid view shows some diffuse degenerative changes no obvious fracture.  IMPRESSION: Right shoulder pain question secondary to rotator cuff tear. Right wrist pain due to resolving sprain and degenerative joint disease.  DISPOSITION: For her shoulder we'll schedule MRI to rule out rotator cuff tear. Follow up after MRI to delineate therapeutic recommendations. For wrist she'll see if this gets better on its own, we may try a injection versus MRI in the future. All questions answered.   Loreta Ave, M.D. Electronically verified by Loreta Ave, M.D. DFM(JMO):kh D 11-03-11  [name]/[facility] [address]   [address] [place], [place] [address] [phone] A Division of [facility]  Loreta Ave, M.D.     Robert A. Thurston Hole, M.D.     Lunette Stands, M.D. Eulas Post, M.D.    Buford Dresser, M.D. Estell Harpin, M.D. Ralene Cork, D.O.          Genene Churn. Barry Dienes, PA-C  Kirstin A. Shepperson, PA-C Walker, OPA-C   RE: [name], [name]                                [id number]      DOB: [birth year] PROGRESS NOTE: 11-10-11 [age] year-old white female with a history of right shoulder pain.  Returns for review of MRI scan performed on November 08, 2011.  Scan showed partial thickness  articular surface tears of the supraspinatus, infraspinatus and subscapularis tendons.  Complete avulsion of the long head of the biceps tendon with distal retraction.  Symptoms unchanged from previous visit.   EXAMINATION: Pleasant white female, alert and oriented x 3 and in no acute distress.  Positive impingement test.  Pain and weakness with supraspinatus resistance.  Neurovascularly intact.  Skin warm and dry.  No increase in respiratory effort.   IMPRESSION: Right shoulder pain secondary to partial thickness cuff tear.    PLAN:  Advised patient that the best treatment option at this point would be right shoulder arthroscopy with debridement and possible arthroscopically assisted rotator cuff repair.  She had previous open subacromial decompression, DCE and rotator cuff repair several years ago.  Patient will let us know when she is wanting to proceed with scheduling, but should not wait too long.  Will need pre-op medical clearance.  All questions answered.    Loreta Ave, M.D.   Electronically verified by Loreta Ave, M.D. DFM(JMO):jjh D 11-15-11 T 11-15-11

## 2011-12-06 ENCOUNTER — Encounter (HOSPITAL_BASED_OUTPATIENT_CLINIC_OR_DEPARTMENT_OTHER): Payer: Self-pay | Admitting: *Deleted

## 2011-12-06 NOTE — Progress Notes (Signed)
To bring all meds,overnight bag,called for recent ekg-

## 2011-12-07 ENCOUNTER — Encounter (HOSPITAL_BASED_OUTPATIENT_CLINIC_OR_DEPARTMENT_OTHER)
Admission: RE | Disposition: A | Discharge status: Home / Self Care | Payer: Self-pay | Source: Ambulatory Visit | Attending: Orthopedic Surgery

## 2011-12-07 ENCOUNTER — Encounter (HOSPITAL_BASED_OUTPATIENT_CLINIC_OR_DEPARTMENT_OTHER): Payer: Self-pay | Admitting: Anesthesiology

## 2011-12-07 ENCOUNTER — Ambulatory Visit (HOSPITAL_BASED_OUTPATIENT_CLINIC_OR_DEPARTMENT_OTHER)
Admission: RE | Admit: 2011-12-07 | Discharge: 2011-12-07 | Disposition: A | Discharge status: Home / Self Care | Payer: Medicare Other | Source: Ambulatory Visit | Attending: Orthopedic Surgery | Admitting: Orthopedic Surgery

## 2011-12-07 ENCOUNTER — Ambulatory Visit (HOSPITAL_BASED_OUTPATIENT_CLINIC_OR_DEPARTMENT_OTHER): Payer: Medicare Other | Admitting: Anesthesiology

## 2011-12-07 ENCOUNTER — Encounter (HOSPITAL_BASED_OUTPATIENT_CLINIC_OR_DEPARTMENT_OTHER): Payer: Self-pay | Admitting: *Deleted

## 2011-12-07 DIAGNOSIS — I10 Essential (primary) hypertension: Secondary | ICD-10-CM | POA: Insufficient documentation

## 2011-12-07 DIAGNOSIS — M719 Bursopathy, unspecified: Secondary | ICD-10-CM | POA: Insufficient documentation

## 2011-12-07 DIAGNOSIS — Z471 Aftercare following joint replacement surgery: Secondary | ICD-10-CM

## 2011-12-07 DIAGNOSIS — E7800 Pure hypercholesterolemia, unspecified: Secondary | ICD-10-CM | POA: Insufficient documentation

## 2011-12-07 DIAGNOSIS — M67919 Unspecified disorder of synovium and tendon, unspecified shoulder: Secondary | ICD-10-CM | POA: Insufficient documentation

## 2011-12-07 DIAGNOSIS — M25819 Other specified joint disorders, unspecified shoulder: Secondary | ICD-10-CM | POA: Insufficient documentation

## 2011-12-07 DIAGNOSIS — J449 Chronic obstructive pulmonary disease, unspecified: Secondary | ICD-10-CM | POA: Insufficient documentation

## 2011-12-07 DIAGNOSIS — J4489 Other specified chronic obstructive pulmonary disease: Secondary | ICD-10-CM | POA: Insufficient documentation

## 2011-12-07 HISTORY — DX: Other complications of anesthesia, initial encounter: T88.59XA

## 2011-12-07 HISTORY — PX: SHOULDER SURGERY: SHX246

## 2011-12-07 HISTORY — DX: Adverse effect of unspecified anesthetic, initial encounter: T41.45XA

## 2011-12-07 HISTORY — DX: Chronic obstructive pulmonary disease, unspecified: J44.9

## 2011-12-07 HISTORY — PX: SHOULDER ARTHROSCOPY: SHX128

## 2011-12-07 HISTORY — DX: Cardiac arrhythmia, unspecified: I49.9

## 2011-12-07 LAB — POCT I-STAT, CHEM 8
Creatinine, Ser: 0.9 mg/dL (ref 0.50–1.10)
Glucose, Bld: 112 mg/dL — ABNORMAL HIGH (ref 70–99)
HCT: 52 % — ABNORMAL HIGH (ref 36.0–46.0)
Hemoglobin: 17.7 g/dL — ABNORMAL HIGH (ref 12.0–15.0)
TCO2: 26 mmol/L (ref 0–100)

## 2011-12-07 SURGERY — ARTHROSCOPY, SHOULDER
Anesthesia: General | Site: Shoulder | Laterality: Right | Wound class: Clean

## 2011-12-07 MED ORDER — FENTANYL CITRATE 0.05 MG/ML IJ SOLN
25.0000 ug | INTRAMUSCULAR | Status: DC | PRN
  Administered 2011-12-07: 25 ug via INTRAVENOUS

## 2011-12-07 MED ORDER — LIDOCAINE HCL (CARDIAC) 20 MG/ML IV SOLN
INTRAVENOUS | Status: DC | PRN
  Administered 2011-12-07: 50 mg via INTRAVENOUS

## 2011-12-07 MED ORDER — ROPIVACAINE HCL 5 MG/ML IJ SOLN
INTRAMUSCULAR | Status: DC | PRN
  Administered 2011-12-07: 15 mL via EPIDURAL

## 2011-12-07 MED ORDER — METOCLOPRAMIDE HCL 5 MG/ML IJ SOLN: 10.0000 mg | Freq: Once | INTRAMUSCULAR | Status: DC | PRN

## 2011-12-07 MED ORDER — MIDAZOLAM HCL 2 MG/2ML IJ SOLN
0.5000 mg | INTRAMUSCULAR | Status: DC | PRN
  Administered 2011-12-07: 2 mg via INTRAVENOUS

## 2011-12-07 MED ORDER — METOCLOPRAMIDE HCL 5 MG/ML IJ SOLN
INTRAMUSCULAR | Status: DC | PRN
  Administered 2011-12-07: 10 mg via INTRAVENOUS

## 2011-12-07 MED ORDER — ONDANSETRON HCL 4 MG/2ML IJ SOLN
INTRAMUSCULAR | Status: DC | PRN
  Administered 2011-12-07: 4 mg via INTRAVENOUS

## 2011-12-07 MED ORDER — LIDOCAINE HCL 1 % IJ SOLN
INTRAMUSCULAR | Status: DC | PRN
  Administered 2011-12-07: 2 mL via INTRADERMAL

## 2011-12-07 MED ORDER — SODIUM CHLORIDE 0.9 % IR SOLN
Status: DC | PRN
  Administered 2011-12-07: 6000 mL

## 2011-12-07 MED ORDER — MORPHINE SULFATE 2 MG/ML IJ SOLN: 0.0500 mg/kg | INTRAMUSCULAR | Status: DC | PRN

## 2011-12-07 MED ORDER — PROPOFOL 10 MG/ML IV EMUL
INTRAVENOUS | Status: DC | PRN
  Administered 2011-12-07: 200 mg via INTRAVENOUS

## 2011-12-07 MED ORDER — DEXAMETHASONE SODIUM PHOSPHATE 4 MG/ML IJ SOLN
INTRAMUSCULAR | Status: DC | PRN
  Administered 2011-12-07: 10 mg via INTRAVENOUS

## 2011-12-07 MED ORDER — CEFAZOLIN SODIUM 1-5 GM-% IV SOLN
1.0000 g | INTRAVENOUS | Status: AC
  Administered 2011-12-07: 1 g via INTRAVENOUS

## 2011-12-07 MED ORDER — SUCCINYLCHOLINE CHLORIDE 20 MG/ML IJ SOLN
INTRAMUSCULAR | Status: DC | PRN
  Administered 2011-12-07: 100 mg via INTRAVENOUS

## 2011-12-07 MED ORDER — LACTATED RINGERS IV SOLN
INTRAVENOUS | Status: DC
  Administered 2011-12-07: 11:00:00 via INTRAVENOUS

## 2011-12-07 MED ORDER — FENTANYL CITRATE 0.05 MG/ML IJ SOLN
50.0000 ug | INTRAMUSCULAR | Status: DC | PRN
  Administered 2011-12-07: 100 ug via INTRAVENOUS
  Administered 2011-12-07: 50 ug via INTRAVENOUS

## 2011-12-07 SURGICAL SUPPLY — 72 items
APL SKNCLS STERI-STRIP NONHPOA (GAUZE/BANDAGES/DRESSINGS)
BENZOIN TINCTURE PRP APPL 2/3 (GAUZE/BANDAGES/DRESSINGS) IMPLANT
BLADE CUTTER GATOR 3.5 (BLADE) ×3 IMPLANT
BLADE CUTTER MENIS 5.5 (BLADE) IMPLANT
BLADE GREAT WHITE 4.2 (BLADE) ×3 IMPLANT
BLADE SURG 15 STRL LF DISP TIS (BLADE) IMPLANT
BLADE SURG 15 STRL SS (BLADE)
BUR OVAL 6.0 (BURR) ×3 IMPLANT
CANISTER OMNI JUG 16 LITER (MISCELLANEOUS) ×3 IMPLANT
CANISTER SUCTION 2500CC (MISCELLANEOUS) IMPLANT
CANNULA TWIST IN 8.25X7CM (CANNULA) IMPLANT
CLOTH BEACON ORANGE TIMEOUT ST (SAFETY) ×3 IMPLANT
DECANTER SPIKE VIAL GLASS SM (MISCELLANEOUS) IMPLANT
DRAPE OEC MINIVIEW 54X84 (DRAPES) IMPLANT
DRAPE STERI 35X30 U-POUCH (DRAPES) ×3 IMPLANT
DRAPE U-SHAPE 47X51 STRL (DRAPES) ×3 IMPLANT
DRAPE U-SHAPE 76X120 STRL (DRAPES) ×6 IMPLANT
DRSG PAD ABDOMINAL 8X10 ST (GAUZE/BANDAGES/DRESSINGS) ×3 IMPLANT
DURAPREP 26ML APPLICATOR (WOUND CARE) ×3 IMPLANT
ELECT MENISCUS 165MM 90D (ELECTRODE) ×3 IMPLANT
ELECT NDL TIP 2.8 STRL (NEEDLE) IMPLANT
ELECT NEEDLE TIP 2.8 STRL (NEEDLE) IMPLANT
ELECT REM PT RETURN 9FT ADLT (ELECTROSURGICAL) ×3
ELECTRODE REM PT RTRN 9FT ADLT (ELECTROSURGICAL) ×2 IMPLANT
GAUZE SPONGE 4X4 12PLY STRL LF (GAUZE/BANDAGES/DRESSINGS) ×2 IMPLANT
GAUZE XEROFORM 1X8 LF (GAUZE/BANDAGES/DRESSINGS) ×3 IMPLANT
GLOVE BIOGEL PI IND STRL 8 (GLOVE) ×2 IMPLANT
GLOVE BIOGEL PI INDICATOR 8 (GLOVE) ×1
GLOVE ORTHO TXT STRL SZ7.5 (GLOVE) ×6 IMPLANT
GOWN BRE IMP PREV XXLGXLNG (GOWN DISPOSABLE) ×3 IMPLANT
GOWN PREVENTION PLUS XLARGE (GOWN DISPOSABLE) ×3 IMPLANT
NDL SCORPION MULTI FIRE (NEEDLE) IMPLANT
NDL SUT 6 .5 CRC .975X.05 MAYO (NEEDLE) IMPLANT
NEEDLE MAYO TAPER (NEEDLE)
NEEDLE SCORPION MULTI FIRE (NEEDLE) IMPLANT
NS IRRIG 1000ML POUR BTL (IV SOLUTION) IMPLANT
PACK ARTHROSCOPY DSU (CUSTOM PROCEDURE TRAY) ×3 IMPLANT
PACK BASIN DAY SURGERY FS (CUSTOM PROCEDURE TRAY) ×3 IMPLANT
PAD ABD 5X9 TENDERSORB (GAUZE/BANDAGES/DRESSINGS) ×2 IMPLANT
PASSER SUT SWANSON 36MM LOOP (INSTRUMENTS) IMPLANT
PENCIL BUTTON HOLSTER BLD 10FT (ELECTRODE) ×3 IMPLANT
SET ARTHROSCOPY TUBING (MISCELLANEOUS) ×3
SET ARTHROSCOPY TUBING LN (MISCELLANEOUS) ×2 IMPLANT
SLEEVE SCD COMPRESS KNEE MED (MISCELLANEOUS) IMPLANT
SLING ARM FOAM STRAP LRG (SOFTGOODS) IMPLANT
SLING ARM FOAM STRAP MED (SOFTGOODS) IMPLANT
SLING ARM FOAM STRAP XLG (SOFTGOODS) IMPLANT
SLING ARM IMMOBILIZER LRG (SOFTGOODS) IMPLANT
SLING ARM IMMOBILIZER MED (SOFTGOODS) IMPLANT
SPONGE GAUZE 4X4 12PLY (GAUZE/BANDAGES/DRESSINGS) ×6 IMPLANT
SPONGE LAP 4X18 X RAY DECT (DISPOSABLE) IMPLANT
STRIP CLOSURE SKIN 1/2X4 (GAUZE/BANDAGES/DRESSINGS) IMPLANT
SUCTION FRAZIER TIP 10 FR DISP (SUCTIONS) IMPLANT
SUT ETHIBOND 2 OS 4 DA (SUTURE) IMPLANT
SUT ETHILON 2 0 FS 18 (SUTURE) IMPLANT
SUT ETHILON 3 0 PS 1 (SUTURE) IMPLANT
SUT FIBERWIRE #2 38 T-5 BLUE (SUTURE)
SUT RETRIEVER MED (INSTRUMENTS) IMPLANT
SUT STEEL 4 (SUTURE) IMPLANT
SUT STEEL 5 (SUTURE) IMPLANT
SUT TIGER TAPE 7 IN WHITE (SUTURE) IMPLANT
SUT VIC AB 0 CT1 27 (SUTURE)
SUT VIC AB 0 CT1 27XBRD ANBCTR (SUTURE) IMPLANT
SUT VIC AB 2-0 SH 27 (SUTURE)
SUT VIC AB 2-0 SH 27XBRD (SUTURE) IMPLANT
SUT VIC AB 3-0 FS2 27 (SUTURE) IMPLANT
SUTURE FIBERWR #2 38 T-5 BLUE (SUTURE) IMPLANT
TAPE CLOTH SURG 6X10 WHT LF (GAUZE/BANDAGES/DRESSINGS) ×2 IMPLANT
TAPE FIBER 2MM 7IN #2 BLUE (SUTURE) IMPLANT
TOWEL OR 17X24 6PK STRL BLUE (TOWEL DISPOSABLE) ×3 IMPLANT
WATER STERILE IRR 1000ML POUR (IV SOLUTION) ×3 IMPLANT
YANKAUER SUCT BULB TIP NO VENT (SUCTIONS) IMPLANT

## 2011-12-07 NOTE — Anesthesia Postprocedure Evaluation (Signed)
Anesthesia Post Note  Patient: [name]  Procedure(s) Performed:  ARTHROSCOPY SHOULDER - Debridement Partial Cuff Tear, Release Coracoacromial Ligament  Anesthesia type: General  Patient location: PACU  Post pain: Pain level controlled  Post assessment: Patient's Cardiovascular Status Stable  Last Vitals:  Filed Vitals:   12/07/11 1437  BP: 98/63  Pulse: 115  Temp: 36.8 C  Resp: 18    Post vital signs: Reviewed and stable  Level of consciousness: alert  Complications: No apparent anesthesia complications

## 2011-12-07 NOTE — Progress Notes (Signed)
Assisted Dr. Frederick with right, interscalene  block. Side rails up, monitors on throughout procedure. See vital signs in flow sheet. Tolerated Procedure well. 

## 2011-12-07 NOTE — Anesthesia Preprocedure Evaluation (Signed)
Anesthesia Evaluation  Patient identified by MRN, date of birth, ID band Patient awake    Reviewed: Allergy & Precautions, H&P , NPO status , Patient's Chart, lab work & pertinent test results, reviewed documented beta blocker date and time   History of Anesthesia Complications (+) PONV  Airway Mallampati: II TM Distance: >3 FB Neck ROM: full    Dental   Pulmonary shortness of breath and with exertion, COPD         Cardiovascular neg cardio ROS + dysrhythmias     Neuro/Psych PSYCHIATRIC DISORDERS  Neuromuscular disease    GI/Hepatic negative GI ROS, Neg liver ROS, hiatal hernia, GERD-  Medicated and Controlled,  Endo/Other  Hyperthyroidism   Renal/GU negative Renal ROS  Genitourinary negative   Musculoskeletal   Abdominal   Peds  Hematology negative hematology ROS (+)   Anesthesia Other Findings See surgeon's H&P   Reproductive/Obstetrics negative OB ROS                           Anesthesia Physical Anesthesia Plan  ASA: III  Anesthesia Plan: General   Post-op Pain Management: MAC Combined w/ Regional for Post-op pain   Induction: Intravenous  Airway Management Planned: Oral ETT  Additional Equipment:   Intra-op Plan:   Post-operative Plan: Extubation in OR  Informed Consent: I have reviewed the patients History and Physical, chart, labs and discussed the procedure including the risks, benefits and alternatives for the proposed anesthesia with the patient or authorized representative who has indicated his/her understanding and acceptance.     Plan Discussed with: CRNA and Surgeon  Anesthesia Plan Comments:         Anesthesia Quick Evaluation

## 2011-12-07 NOTE — Anesthesia Procedure Notes (Addendum)
Procedure Name: Intubation Performed by: Sharyne Richters Pre-anesthesia Checklist: Patient identified, Timeout performed, Emergency Drugs available, Suction available and Patient being monitored Patient Re-evaluated:Patient Re-evaluated prior to inductionOxygen Delivery Method: Circle System Utilized Preoxygenation: Pre-oxygenation with 100% oxygen Intubation Type: IV induction Ventilation: Mask ventilation without difficulty Laryngoscope Size: Miller and 2 Grade View: Grade II Tube type: Oral Tube size: 7.0 mm Number of attempts: 1 Placement Confirmation: ETT inserted through vocal cords under direct vision,  breath sounds checked- equal and bilateral and positive ETCO2 Secured at: 21 cm Tube secured with: Tape Dental Injury: Teeth and Oropharynx as per pre-operative assessment    Anesthesia Regional Block:  Interscalene brachial plexus block  Pre-Anesthetic Checklist: ,, timeout performed, Correct Patient, Correct Site, Correct Laterality, Correct Procedure, Correct Position, site marked, Risks and benefits discussed,  Surgical consent,  Pre-op evaluation,  At surgeon's request and post-op pain management  Laterality: Right  Prep: chloraprep       Needles:   Needle Type: Other   (Arrow Echogenic)   Needle Length: 9cm  Needle Gauge: 21    Additional Needles:  Procedures: ultrasound guided Interscalene brachial plexus block Narrative:  Start time: 12/07/2011 10:57 AM End time: 12/07/2011 11:03 AM Injection made incrementally with aspirations every 5 mL.  Performed by: Personally  Anesthesiologist: Aldona Lento, MD  Additional Notes: Ultrasound guidance used to: id relevant anatomy, confirm needle position, local anesthetic spread, avoidance of vascular puncture. Picture saved. No complications. Block performed personally by Janetta Hora. Gelene Mink, MD    Interscalene brachial plexus block

## 2011-12-07 NOTE — Brief Op Note (Signed)
12/07/2011  12:05 PM  PATIENT:  [name]  66 y.o. female  PRE-OPERATIVE DIAGNOSIS:  right shoulder impingement, degenerative arthritis, Partial cuff tear POST-OPERATIVE DIAGNOSIS:  right shoulder impingement, degenerative arthritis, partial cuff tear  PROCEDURE:  Procedure(s): ARTHROSCOPY right SHOULDER with debridement and CA ligament releast  SURGEON:  Surgeon(s): Loreta Ave, MD  PHYSICIAN ASSISTANT: Zonia Kief M    ANESTHESIA:   regional and general  EBL:  Total I/O In: 1000 [I.V.:1000] Out: -   SPECIMEN:  No Specimen  DISPOSITION OF SPECIMEN:  N/A  TOURNIQUET:  * No tourniquets in log *  PATIENT DISPOSITION:  PACU - hemodynamically stable.

## 2011-12-07 NOTE — Interval H&P Note (Signed)
History and Physical Interval Note:  12/07/2011 7:36 AM  [name]  has presented today for surgery, with the diagnosis of rt shoulder impingement, degenerative arthritis,  The various methods of treatment have been discussed with the patient and family. After consideration of risks, benefits and other options for treatment, the patient has consented to  Procedure(s): SHOULDER ARTHROSCOPY WITH ROTATOR CUFF REPAIR as a surgical intervention .  The patients' history has been reviewed, patient examined, no change in status, stable for surgery.  I have reviewed the patients' chart and labs.  Questions were answered to the patient's satisfaction.     [name]

## 2011-12-07 NOTE — Transfer of Care (Signed)
Immediate Anesthesia Transfer of Care Note  Patient: [name]  Procedure(s) Performed:  ARTHROSCOPY SHOULDER - Debridement Partial Cuff Tear, Release Coracoacromial Ligament  Patient Location: PACU  Anesthesia Type: General  Level of Consciousness: awake, alert  and oriented  Airway & Oxygen Therapy: Patient Spontanous Breathing and Patient connected to nasal cannula oxygen  Post-op Assessment: Report given to PACU RN and Post -op Vital signs reviewed and stable  Post vital signs: Reviewed and stable Filed Vitals:   12/07/11 1105  BP:   Pulse: 110  Temp:   Resp: 19    Complications: No apparent anesthesia complications

## 2011-12-07 NOTE — Progress Notes (Signed)
Pt evaluated by Dr. Gelene Mink. Maintaining O2 sat @ 92%. Pt desires to go home. Understands importance of C, DB and incentive spirometry use. OK for D/C home per Dr. Gelene Mink.

## 2011-12-08 ENCOUNTER — Encounter (HOSPITAL_BASED_OUTPATIENT_CLINIC_OR_DEPARTMENT_OTHER): Payer: Self-pay | Admitting: Orthopedic Surgery

## 2011-12-08 NOTE — Op Note (Signed)
NAME:  [name], [name]                         ACCOUNT NO.:  MEDICAL RECORD NO.:  [id number]  LOCATION:                                 FACILITY:  PHYSICIAN:  Loreta Ave, M.D.      DATE OF BIRTH:  DATE OF PROCEDURE:  12/07/2011 DATE OF DISCHARGE:                              OPERATIVE REPORT   PREOPERATIVE DIAGNOSES:  Right shoulder partial rotator cuff tear. Remote previous medial open repair.  Rupture long head biceps tendon.  POSTOPERATIVE DIAGNOSES:  Right shoulder partial rotator cuff tear. Remote previous medial open repair.  Rupture long head biceps tendon with complex tearing labrum.  Partial tearing, but not full-thickness tearing of supraspinatus tendon mostly undersurface.  Reactive bursitis, and some recurrent impingement from scarring at the coracoacromial ligament.  PROCEDURE:  Right shoulder exam under anesthesia, arthroscopy. Debridement of labrum and rotator cuff.  Bursectomy, lysis debridement of adhesions.  Decompression with re-release coracoacromial ligament.  SURGEON:  Loreta Ave, MD  ASSISTANT:  Genene Churn. Barry Dienes, PA present throughout the entire case and necessary for timely completion of procedure.  ANESTHESIA:  General.  BLOOD LOSS:  Minimal.  SPECIMENS:  None.  CULTURES:  None.  COMPLICATIONS:  None.  DRESSINGS:  Soft compressive with sling.  PROCEDURE:  The patient was brought to the operating room and placed on the operating table in supine position.  After adequate anesthesia had been obtained, shoulder examined.  Full motion, stable shoulder.  Placed in a beach-chair position on the shoulder positioner, prepped and draped in usual sterile fashion.  Three portals anterior, posterior and lateral.  Arthroscope introduced, shoulder distended and inspected. Some grade 2 changes on the glenoid debrided.  Circumferential degenerative tearing labrum debrided.  Biceps had ruptured and pulled out of the shoulder, and there was not much of a  stump.  Partial tearing about 50% of the thickness of supraspinatus tendon throughout the crescent region.  Debrided to a stable surface.  Still very reasonable tissue in the crescent and the anterior-posterior anchors were still intact.  Partial tearing of the infraspinatus and subscap debrided. Nothing full-thickness.  Cannula redirected subacromially.  Adhesions reactive bursitis debrided.  Adequacy of previous acromioplasty distal clavicle excision confirmed.  I did open up the space, however, by just debriding off the front of the acromion re-releasing the CA ligament. Adequacy of decompression debridement confirmed viewing from all portals.  I got an excellent look of the top of the cuff, and further intervention of the cuff was not indicated.  Instruments were removed. Portals were closed with nylon.  Sterile compressive dressing applied. Sling applied.  Anesthesia reversed.  Brought to the recovery room. Tolerated the surgery well.  No complications.     Loreta Ave, M.D.     DFM/MEDQ  D:  12/07/2011  T:  12/08/2011  Job:  [id number]

## 2012-05-08 ENCOUNTER — Ambulatory Visit: Payer: Self-pay | Admitting: Neurological Surgery

## 2012-05-27 ENCOUNTER — Other Ambulatory Visit: Payer: Self-pay | Admitting: *Deleted

## 2012-05-27 MED ORDER — METOPROLOL TARTRATE 50 MG PO TABS: 25.0000 mg | ORAL_TABLET | Freq: Every day | ORAL | Status: DC

## 2012-05-27 NOTE — Telephone Encounter (Signed)
LMTCB to set up future appointment has not been seen since last yr and Refilled Metoprolol until she schedules appointment.

## 2012-07-05 ENCOUNTER — Ambulatory Visit: Payer: PRIVATE HEALTH INSURANCE | Admitting: Cardiovascular Disease

## 2012-07-11 ENCOUNTER — Ambulatory Visit (INDEPENDENT_AMBULATORY_CARE_PROVIDER_SITE_OTHER): Payer: PRIVATE HEALTH INSURANCE | Admitting: Cardiovascular Disease

## 2012-07-11 ENCOUNTER — Encounter: Payer: Self-pay | Admitting: Cardiovascular Disease

## 2012-07-11 VITALS — BP 120/70 | HR 89 | Ht 64.0 in | Wt 145.8 lb

## 2012-07-11 DIAGNOSIS — F341 Dysthymic disorder: Secondary | ICD-10-CM

## 2012-07-11 DIAGNOSIS — F329 Major depressive disorder, single episode, unspecified: Secondary | ICD-10-CM | POA: Insufficient documentation

## 2012-07-11 DIAGNOSIS — E785 Hyperlipidemia, unspecified: Secondary | ICD-10-CM

## 2012-07-11 DIAGNOSIS — R000 Tachycardia, unspecified: Secondary | ICD-10-CM

## 2012-07-11 DIAGNOSIS — F32A Depression, unspecified: Secondary | ICD-10-CM

## 2012-07-11 DIAGNOSIS — F419 Anxiety disorder, unspecified: Secondary | ICD-10-CM

## 2012-07-11 DIAGNOSIS — R0602 Shortness of breath: Secondary | ICD-10-CM

## 2012-07-11 DIAGNOSIS — F17200 Nicotine dependence, unspecified, uncomplicated: Secondary | ICD-10-CM
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
## 2012-07-11 NOTE — Assessment & Plan Note (Signed)
We have suggested she continue her statin.

## 2012-07-11 NOTE — Assessment & Plan Note (Signed)
Symptoms have improved. She would like to hold the metoprolol as this is dropping her blood pressure. We have suggested she hold the metoprolol and take this as needed for palpitations.

## 2012-07-11 NOTE — Assessment & Plan Note (Signed)
We have encouraged her to continue to work on weaning her cigarettes and smoking cessation. She will continue to work on this and does not want any assistance with chantix.  

## 2012-07-11 NOTE — Assessment & Plan Note (Signed)
She has significant stressors at home. Tearful at times on today's visit.

## 2012-07-11 NOTE — Patient Instructions (Addendum)
You are doing well. Please hold the metoprolol for low blood pressure Take as needed for palpitations or tachycardia  Please call us if you have new issues that need to be addressed before your next appt.  Your physician wants you to follow-up in: 12 months.  You will receive a reminder letter in the mail two months in advance. If you don't receive a letter, please call our office to schedule the follow-up appointment.

## 2013-07-03 ENCOUNTER — Ambulatory Visit: Payer: Self-pay | Admitting: Internal Medicine

## 2013-07-09 ENCOUNTER — Ambulatory Visit: Payer: Self-pay | Admitting: Internal Medicine

## 2013-10-01 ENCOUNTER — Ambulatory Visit: Payer: PRIVATE HEALTH INSURANCE | Admitting: Cardiovascular Disease

## 2013-10-14 ENCOUNTER — Ambulatory Visit: Payer: PRIVATE HEALTH INSURANCE | Admitting: Cardiovascular Disease

## 2013-10-24 ENCOUNTER — Encounter: Payer: Self-pay | Admitting: Cardiovascular Disease

## 2013-10-24 ENCOUNTER — Ambulatory Visit (INDEPENDENT_AMBULATORY_CARE_PROVIDER_SITE_OTHER): Payer: PRIVATE HEALTH INSURANCE | Admitting: Cardiovascular Disease

## 2013-10-24 VITALS — BP 100/60 | HR 105 | Ht 64.0 in | Wt 142.2 lb

## 2013-10-24 DIAGNOSIS — F329 Major depressive disorder, single episode, unspecified: Secondary | ICD-10-CM

## 2013-10-24 DIAGNOSIS — E785 Hyperlipidemia, unspecified: Secondary | ICD-10-CM

## 2013-10-24 DIAGNOSIS — F341 Dysthymic disorder: Secondary | ICD-10-CM

## 2013-10-24 DIAGNOSIS — R000 Tachycardia, unspecified: Secondary | ICD-10-CM

## 2013-10-24 DIAGNOSIS — R0602 Shortness of breath: Secondary | ICD-10-CM

## 2013-10-24 DIAGNOSIS — F32A Depression, unspecified: Secondary | ICD-10-CM

## 2013-10-24 DIAGNOSIS — F17200 Nicotine dependence, unspecified, uncomplicated: Secondary | ICD-10-CM
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
## 2013-10-24 NOTE — Assessment & Plan Note (Signed)
She has tachycardia on today's visit but is asymptomatic. She does not want any beta blockers for rate control. Elevated heart rate likely from severe underlying lung disease

## 2013-10-24 NOTE — Patient Instructions (Signed)
You are doing well. No medication changes were made.  Please call us if you have new issues that need to be addressed before your next appt.  Your physician wants you to follow-up in: 12 months.  You will receive a reminder letter in the mail two months in advance. If you don't receive a letter, please call our office to schedule the follow-up appointment. 

## 2013-10-24 NOTE — Assessment & Plan Note (Signed)
We have encouraged her to continue to work on weaning her cigarettes and smoking cessation. She will continue to work on this and does not want any assistance with chantix.  

## 2013-10-24 NOTE — Assessment & Plan Note (Signed)
Chronic mild shortness of breath from underlying COPD

## 2013-10-24 NOTE — Assessment & Plan Note (Signed)
This is a major health issue for her with ongoing stress at home

## 2013-10-24 NOTE — Assessment & Plan Note (Addendum)
No recent lipid panel available. Encouraged her to continue on Lipitor

## 2014-01-21 ENCOUNTER — Ambulatory Visit: Payer: Self-pay | Admitting: Pain Medicine

## 2014-02-04 ENCOUNTER — Ambulatory Visit: Payer: Self-pay | Admitting: Pain Medicine

## 2014-02-10 ENCOUNTER — Ambulatory Visit: Payer: Self-pay | Admitting: Pain Medicine

## 2014-02-10 LAB — BASIC METABOLIC PANEL
ANION GAP: 4 — AB (ref 7–16)
BUN: 6 mg/dL — ABNORMAL LOW (ref 7–18)
CALCIUM: 8.7 mg/dL (ref 8.5–10.1)
Chloride: 106 mmol/L (ref 98–107)
Co2: 28 mmol/L (ref 21–32)
Creatinine: 0.89 mg/dL (ref 0.60–1.30)
EGFR (African American): >60
EGFR (Non-African Amer.): >60
Glucose: 99 mg/dL (ref 65–99)
Osmolality: 273 (ref 275–301)
POTASSIUM: 4.5 mmol/L (ref 3.5–5.1)
Sodium: 138 mmol/L (ref 136–145)

## 2014-02-10 LAB — HEPATIC FUNCTION PANEL A (ARMC)
ALBUMIN: 2.8 g/dL — AB (ref 3.4–5.0)
ALK PHOS: 126 U/L — AB
Bilirubin,Total: 0.2 mg/dL (ref 0.2–1.0)
SGOT(AST): 19 U/L (ref 15–37)
SGPT (ALT): 15 U/L (ref 12–78)
Total Protein: 6.7 g/dL (ref 6.4–8.2)

## 2014-02-10 LAB — SEDIMENTATION RATE: Erythrocyte Sed Rate: 58 mm/hr — ABNORMAL HIGH (ref 0–30)

## 2014-02-10 LAB — MAGNESIUM: Magnesium: 1.6 mg/dL — ABNORMAL LOW

## 2014-03-04 ENCOUNTER — Ambulatory Visit: Payer: Self-pay | Admitting: Pain Medicine

## 2014-03-12 ENCOUNTER — Ambulatory Visit: Payer: Self-pay | Admitting: Pain Medicine

## 2014-04-03 ENCOUNTER — Ambulatory Visit: Payer: Self-pay | Admitting: Pain Medicine

## 2014-04-29 ENCOUNTER — Ambulatory Visit: Payer: Self-pay | Admitting: Pain Medicine

## 2014-05-29 ENCOUNTER — Ambulatory Visit: Payer: Self-pay | Admitting: Pain Medicine

## 2014-06-16 ENCOUNTER — Ambulatory Visit: Payer: Self-pay | Admitting: Pain Medicine

## 2014-08-28 ENCOUNTER — Ambulatory Visit: Payer: Self-pay | Admitting: Pain Medicine

## 2015-09-08 ENCOUNTER — Encounter: Payer: PRIVATE HEALTH INSURANCE | Admitting: Pain Medicine

## 2015-09-09 ENCOUNTER — Encounter: Payer: Self-pay | Admitting: Pain Medicine

## 2015-09-09 ENCOUNTER — Ambulatory Visit: Payer: Medicare Other | Attending: Pain Medicine | Admitting: Pain Medicine

## 2015-09-09 VITALS — BP 151/101 | HR 125 | Temp 98.4°F | Resp 20 | Ht 64.0 in | Wt 116.0 lb

## 2015-09-09 DIAGNOSIS — M545 Low back pain, unspecified: Secondary | ICD-10-CM

## 2015-09-09 DIAGNOSIS — M791 Myalgia: Secondary | ICD-10-CM

## 2015-09-09 DIAGNOSIS — R94130 Abnormal response to nerve stimulation, unspecified: Secondary | ICD-10-CM

## 2015-09-09 DIAGNOSIS — F419 Anxiety disorder, unspecified: Secondary | ICD-10-CM | POA: Diagnosis not present

## 2015-09-09 DIAGNOSIS — F1190 Opioid use, unspecified, uncomplicated: Secondary | ICD-10-CM | POA: Diagnosis not present

## 2015-09-09 DIAGNOSIS — F1120 Opioid dependence, uncomplicated: Secondary | ICD-10-CM | POA: Diagnosis not present

## 2015-09-09 DIAGNOSIS — Z5181 Encounter for therapeutic drug level monitoring: Secondary | ICD-10-CM | POA: Insufficient documentation

## 2015-09-09 DIAGNOSIS — F1990 Other psychoactive substance use, unspecified, uncomplicated: Secondary | ICD-10-CM

## 2015-09-09 DIAGNOSIS — K219 Gastro-esophageal reflux disease without esophagitis: Secondary | ICD-10-CM | POA: Insufficient documentation

## 2015-09-09 DIAGNOSIS — G8929 Other chronic pain: Secondary | ICD-10-CM

## 2015-09-09 DIAGNOSIS — M961 Postlaminectomy syndrome, not elsewhere classified: Secondary | ICD-10-CM

## 2015-09-09 DIAGNOSIS — M25511 Pain in right shoulder: Secondary | ICD-10-CM | POA: Insufficient documentation

## 2015-09-09 DIAGNOSIS — M25512 Pain in left shoulder: Secondary | ICD-10-CM | POA: Insufficient documentation

## 2015-09-09 DIAGNOSIS — J449 Chronic obstructive pulmonary disease, unspecified: Secondary | ICD-10-CM | POA: Diagnosis not present

## 2015-09-09 DIAGNOSIS — M7541 Impingement syndrome of right shoulder: Secondary | ICD-10-CM

## 2015-09-09 DIAGNOSIS — F17200 Nicotine dependence, unspecified, uncomplicated: Secondary | ICD-10-CM | POA: Diagnosis not present

## 2015-09-09 DIAGNOSIS — M47812 Spondylosis without myelopathy or radiculopathy, cervical region: Secondary | ICD-10-CM

## 2015-09-09 DIAGNOSIS — M549 Dorsalgia, unspecified: Secondary | ICD-10-CM | POA: Diagnosis present

## 2015-09-09 DIAGNOSIS — M5030 Other cervical disc degeneration, unspecified cervical region: Secondary | ICD-10-CM

## 2015-09-09 DIAGNOSIS — G894 Chronic pain syndrome: Secondary | ICD-10-CM

## 2015-09-09 DIAGNOSIS — R700 Elevated erythrocyte sedimentation rate: Secondary | ICD-10-CM

## 2015-09-09 DIAGNOSIS — M5382 Other specified dorsopathies, cervical region: Secondary | ICD-10-CM

## 2015-09-09 DIAGNOSIS — F329 Major depressive disorder, single episode, unspecified: Secondary | ICD-10-CM | POA: Insufficient documentation

## 2015-09-09 DIAGNOSIS — M542 Cervicalgia: Secondary | ICD-10-CM | POA: Insufficient documentation

## 2015-09-09 DIAGNOSIS — R790 Abnormal level of blood mineral: Secondary | ICD-10-CM

## 2015-09-09 DIAGNOSIS — Z79891 Long term (current) use of opiate analgesic: Secondary | ICD-10-CM

## 2015-09-09 DIAGNOSIS — M7918 Myalgia, other site: Secondary | ICD-10-CM

## 2015-09-09 DIAGNOSIS — Z79899 Other long term (current) drug therapy: Secondary | ICD-10-CM

## 2015-09-09 DIAGNOSIS — R7982 Elevated C-reactive protein (CRP): Secondary | ICD-10-CM

## 2015-09-09 MED ORDER — MORPHINE SULFATE ER 30 MG PO TBCR: 30.0000 mg | EXTENDED_RELEASE_TABLET | Freq: Two times a day (BID) | ORAL | Status: DC

## 2015-09-09 NOTE — Progress Notes (Signed)
Safety precautions to be maintained throughout the outpatient stay will include: orient to surroundings, keep bed in low position, maintain call bell within reach at all times, provide assistance with transfer out of bed and ambulation. Pill count #0 Morphine

## 2015-09-09 NOTE — Progress Notes (Signed)
Patient's Name: [name] MRN: [id number] DOB: Aug 11, 1946 DOS: 09/09/2015  Primary Reason(s) for Visit: Encounter for Medication Management. CC: Neck Pain; Back Pain; and Shoulder Pain   HPI:   [name] is a 69 y.o. year old, female patient, who returns today as an established patient. She has HYPERLIPIDEMIA-MIXED; Tachycardia; DYSPNEA; HYPOTHYROIDISM; Smoking; Fatigue; Anxiety and depression; Encounter for therapeutic drug level monitoring; Long term current use of opiate analgesic; Long term prescription opiate use; Uncomplicated opioid dependence ([name]); Opiate use; Chronic pain; Substance use disorder Risk: High; Chronic pain syndrome; Cervical spondylosis; DDD (degenerative disc disease), cervical; Chronic neck pain; Failed cervical surgery syndrome; Cervical facet syndrome, bilateral; Myofascial pain syndrome, cervical; Chronic low back pain; Lumbar spondylosis; Impingement syndrome of right shoulder; Low magnesium levels; CRP elevated; Elevated sedimentation rate; Chronic obstructive pulmonary disease (COPD) ([place]); Nicotine dependence; Chronic right shoulder pain; and Abnormal nerve conduction studies on her problem list.. Her primarily concern today is the Neck Pain; Back Pain; and Shoulder Pain   The patient comes into the clinic today referring that she feels that the extended release morphine is not quite lasting the 12 hours. Today we had conversation with her hours to medication tolerance and how to manage it using "drug holidays". She understood and accepted. The patient refers that she has a gum infection and she will be having some dental work. I told her that if the physician feels that she may be needing some additional pain medicine, she is free to get some from the physician as long as she calls [place] and let us know what she was given and instructions to physician to also send [place] a copy of the note. We have agreed to work on bed drug holiday once she gets done with the treatment of her  gums. Today we have provided the patient with enough medication refills to last until 12/08/15.  Pharmacotherapy Review: Side-effects or Adverse reactions: None reported. Effectiveness: Described as relatively effective, allowing for increase in activities of daily living (ADL). Onset of action: Within expected pharmacological parameters. Duration of action: Within normal limits for medication. Peak effect: Timing and results are as within normal expected parameters. [place] PMP: Compliant with practice rules and regulations. DST: Compliant with practice rules and regulations. Lab work: No new labs ordered by our practice. Treatment compliance: Compliant. Substance Use Disorder (SUD) Risk Level: Low Planned course of action: Continue therapy as is.  Allergies: [name] has No Known Allergies.  Meds: The patient has a current medication list which includes the following prescription(s): albuterol-ipratropium, alprazolam, atorvastatin, bupropion, vitamin d3, escitalopram, fluticasone-salmeterol, levothyroxine, magnesium oxide, morphine, proair hfa, tizanidine, trazodone, morphine, and morphine. Requested Prescriptions   Signed Prescriptions Disp Refills  . morphine (MS CONTIN) 30 MG 12 hr tablet 60 tablet 0    Sig: Take 1 tablet (30 mg total) by mouth every 12 (twelve) hours.  Marland Kitchen morphine (MS CONTIN) 30 MG 12 hr tablet 60 tablet 0    Sig: Take 1 tablet (30 mg total) by mouth every 12 (twelve) hours.  Marland Kitchen morphine (MS CONTIN) 30 MG 12 hr tablet 60 tablet 0    Sig: Take 1 tablet (30 mg total) by mouth every 12 (twelve) hours.    ROS: Constitutional: Afebrile, no chills, well hydrated and well nourished Gastrointestinal: negative Musculoskeletal:negative Neurological: negative Behavioral/Psych: negative  PFSH: Medical:  [name]  has a past medical history of Hyperthyroidism; Dyslipidemia; Esophageal spasm; History of peptic ulcer disease; Depression; Pelvic fracture ([place]) (2008); Hiatal  hernia; Carpal tunnel  syndrome; Rotator cuff injury; Complication of anesthesia; COPD (chronic obstructive pulmonary disease) ([place]); Dysrhythmia; Gastrointestinal parasites; Hypothyroidism; Anxiety; CAD (coronary artery disease); Reflux; Low magnesium levels (09/14/2015); CRP elevated (09/14/2015); and Elevated sedimentation rate (09/14/2015). Family: family history includes Aneurysm in an other family member. Surgical:  has past surgical history that includes Cesarean section; Neck surgery; Cholecystectomy; Carpal tunnel release; Rotator cuff repair; Shoulder arthroscopy (12/07/2011); and Shoulder surgery (12/07/2011). Tobacco:  reports that she has been smoking Cigarettes.  She has been smoking about 0.50 packs per day. She does not have any smokeless tobacco history on file. Alcohol:  reports that she does not drink alcohol. Drug:  reports that she does not use illicit drugs.  Physical Exam: Vitals:  Today's Vitals   09/09/15 0901 09/09/15 0905  BP: 151/101   Pulse: 125   Temp: 98.4 F (36.9 C)   Resp: 20   Height: 5\' 4"  (1.626 m)   Weight: 116 lb (52.617 kg)   SpO2: 96%   PainSc: 6  6   PainLoc: Neck   Calculated BMI: Body mass index is 19.9 kg/(m^2). General appearance: alert, cooperative, appears older than stated age and no distress Eyes: conjunctivae/corneas clear. PERRL, EOM's intact. Fundi benign. Lungs: No evidence respiratory distress, no audible rales or ronchi and no use of accessory muscles of respiration Neck: no adenopathy, no carotid bruit, no JVD, supple, symmetrical, trachea midline and thyroid not enlarged, symmetric, no tenderness/mass/nodules Back: symmetric, no curvature. ROM normal. No CVA tenderness. Extremities: extremities normal, atraumatic, no cyanosis or edema Pulses: 2+ and symmetric Skin: Skin color, texture, turgor normal. No rashes or lesions Neurologic: Grossly normal    Assessment: Encounter Diagnosis:  Primary Diagnosis: Chronic pain  [G89.29]  Plan: [name] was seen today for neck pain, back pain and shoulder pain.  Diagnoses and all orders for this visit:  Chronic pain -     morphine (MS CONTIN) 30 MG 12 hr tablet; Take 1 tablet (30 mg total) by mouth every 12 (twelve) hours. -     morphine (MS CONTIN) 30 MG 12 hr tablet; Take 1 tablet (30 mg total) by mouth every 12 (twelve) hours. -     morphine (MS CONTIN) 30 MG 12 hr tablet; Take 1 tablet (30 mg total) by mouth every 12 (twelve) hours.  Opiate use  Uncomplicated opioid dependence ([place])  Long term prescription opiate use  Long term current use of opiate analgesic -     Drugs of abuse screen w/o alc, rtn urine-sln; Standing  Encounter for therapeutic drug level monitoring  Substance use disorder Risk: High  Chronic pain syndrome  Cervical spondylosis  DDD (degenerative disc disease), cervical  Chronic neck pain  Failed cervical surgery syndrome  Cervical facet syndrome, bilateral  Myofascial pain syndrome, cervical  Chronic low back pain  Impingement syndrome of right shoulder  Low magnesium levels  CRP elevated  Elevated sedimentation rate  Chronic right shoulder pain  Abnormal nerve conduction studies     There are no Patient Instructions on file for this visit. Medications discontinued today:  Medications Discontinued During This Encounter  Medication Reason  . atorvastatin (LIPITOR) 20 MG tablet Error  . buPROPion (WELLBUTRIN) 100 MG tablet Error  . methocarbamol (ROBAXIN) 750 MG tablet Error  . montelukast (SINGULAIR) 10 MG tablet Error  . niacin (NIASPAN) 1000 MG CR tablet Error  . oxyCODONE-acetaminophen (PERCOCET) 10-325 MG per tablet Error  . morphine (MS CONTIN) 30 MG 12 hr tablet Reorder  . acyclovir (ZOVIRAX) 800 MG tablet Error  .  potassium chloride SA (K-DUR,KLOR-CON) 20 MEQ tablet Error   Medications administered today:  [name] had no medications administered during this visit.  Primary Care Physician:  Albina Billet, MD Location: [facility] Note by: Kathlen Brunswick. Dossie Arbour, M.D, DABA, DABAPM, DABPM, DABIPP, FIPP

## 2015-09-14 ENCOUNTER — Encounter: Payer: Self-pay | Admitting: Pain Medicine

## 2015-09-14 DIAGNOSIS — R7982 Elevated C-reactive protein (CRP): Secondary | ICD-10-CM | POA: Insufficient documentation

## 2015-09-14 DIAGNOSIS — M961 Postlaminectomy syndrome, not elsewhere classified: Secondary | ICD-10-CM | POA: Insufficient documentation

## 2015-09-14 DIAGNOSIS — G894 Chronic pain syndrome: Secondary | ICD-10-CM | POA: Insufficient documentation

## 2015-09-14 DIAGNOSIS — M25511 Pain in right shoulder: Secondary | ICD-10-CM

## 2015-09-14 DIAGNOSIS — M7918 Myalgia, other site: Secondary | ICD-10-CM | POA: Insufficient documentation

## 2015-09-14 DIAGNOSIS — R790 Abnormal level of blood mineral: Secondary | ICD-10-CM

## 2015-09-14 DIAGNOSIS — F1990 Other psychoactive substance use, unspecified, uncomplicated: Secondary | ICD-10-CM | POA: Insufficient documentation

## 2015-09-14 DIAGNOSIS — R700 Elevated erythrocyte sedimentation rate: Secondary | ICD-10-CM

## 2015-09-14 DIAGNOSIS — R94130 Abnormal response to nerve stimulation, unspecified: Secondary | ICD-10-CM | POA: Insufficient documentation

## 2015-09-14 DIAGNOSIS — M47812 Spondylosis without myelopathy or radiculopathy, cervical region: Secondary | ICD-10-CM | POA: Insufficient documentation

## 2015-09-14 DIAGNOSIS — M47816 Spondylosis without myelopathy or radiculopathy, lumbar region: Secondary | ICD-10-CM | POA: Insufficient documentation

## 2015-09-14 DIAGNOSIS — M542 Cervicalgia: Secondary | ICD-10-CM

## 2015-09-14 DIAGNOSIS — G8929 Other chronic pain: Secondary | ICD-10-CM | POA: Insufficient documentation

## 2015-09-14 DIAGNOSIS — M7541 Impingement syndrome of right shoulder: Secondary | ICD-10-CM | POA: Insufficient documentation

## 2015-09-14 DIAGNOSIS — F17200 Nicotine dependence, unspecified, uncomplicated: Secondary | ICD-10-CM | POA: Insufficient documentation

## 2015-09-14 DIAGNOSIS — J449 Chronic obstructive pulmonary disease, unspecified: Secondary | ICD-10-CM | POA: Insufficient documentation

## 2015-09-14 HISTORY — DX: Nicotine dependence, unspecified, uncomplicated: F17.200

## 2015-09-14 HISTORY — DX: Elevated erythrocyte sedimentation rate: R70.0

## 2015-09-14 HISTORY — DX: Elevated C-reactive protein (CRP): R79.82

## 2015-09-14 HISTORY — DX: Abnormal level of blood mineral: R79.0

## 2015-09-27 ENCOUNTER — Telehealth: Payer: Self-pay | Admitting: *Deleted

## 2015-09-27 NOTE — Telephone Encounter (Signed)
[name] called and states she is having a lot of pain in her leg and lower back.  Wants to know what she should do, told her we could schedule her an appt or see when her next appt is.  She states that she does not have money for x-rays and does not want to go through procedures anymore.  Asked what her pain was stemming from and this is an injury from when her horse threw her.  Encouraged patient to try ice and heat alternately for 15-20 minutes at a time.  Patient decides that she wants to wait and see how things go and if it gets any better with pain meds and these recommendations.  Told her that if things don't get any better she could call and make an appt.

## 2015-10-07 ENCOUNTER — Other Ambulatory Visit: Payer: Self-pay | Admitting: Pain Medicine

## 2015-12-08 ENCOUNTER — Other Ambulatory Visit: Payer: Self-pay | Admitting: Pain Medicine

## 2015-12-08 ENCOUNTER — Ambulatory Visit: Payer: Medicare HMO | Attending: Pain Medicine | Admitting: Pain Medicine

## 2015-12-08 ENCOUNTER — Encounter: Payer: Self-pay | Admitting: Pain Medicine

## 2015-12-08 VITALS — BP 126/73 | HR 102 | Temp 98.5°F | Resp 18 | Ht 64.0 in | Wt 120.0 lb

## 2015-12-08 DIAGNOSIS — M47816 Spondylosis without myelopathy or radiculopathy, lumbar region: Secondary | ICD-10-CM | POA: Insufficient documentation

## 2015-12-08 DIAGNOSIS — F1990 Other psychoactive substance use, unspecified, uncomplicated: Secondary | ICD-10-CM

## 2015-12-08 DIAGNOSIS — M5030 Other cervical disc degeneration, unspecified cervical region: Secondary | ICD-10-CM | POA: Insufficient documentation

## 2015-12-08 DIAGNOSIS — F17210 Nicotine dependence, cigarettes, uncomplicated: Secondary | ICD-10-CM | POA: Diagnosis not present

## 2015-12-08 DIAGNOSIS — M545 Low back pain, unspecified: Secondary | ICD-10-CM

## 2015-12-08 DIAGNOSIS — F1190 Opioid use, unspecified, uncomplicated: Secondary | ICD-10-CM | POA: Diagnosis not present

## 2015-12-08 DIAGNOSIS — F419 Anxiety disorder, unspecified: Secondary | ICD-10-CM | POA: Insufficient documentation

## 2015-12-08 DIAGNOSIS — G8929 Other chronic pain: Secondary | ICD-10-CM | POA: Diagnosis not present

## 2015-12-08 DIAGNOSIS — E782 Mixed hyperlipidemia: Secondary | ICD-10-CM | POA: Insufficient documentation

## 2015-12-08 DIAGNOSIS — Z5181 Encounter for therapeutic drug level monitoring: Secondary | ICD-10-CM

## 2015-12-08 DIAGNOSIS — Z79891 Long term (current) use of opiate analgesic: Secondary | ICD-10-CM

## 2015-12-08 DIAGNOSIS — M549 Dorsalgia, unspecified: Secondary | ICD-10-CM | POA: Diagnosis present

## 2015-12-08 DIAGNOSIS — M542 Cervicalgia: Secondary | ICD-10-CM | POA: Diagnosis present

## 2015-12-08 DIAGNOSIS — M25511 Pain in right shoulder: Secondary | ICD-10-CM | POA: Diagnosis not present

## 2015-12-08 DIAGNOSIS — M25512 Pain in left shoulder: Secondary | ICD-10-CM | POA: Diagnosis not present

## 2015-12-08 DIAGNOSIS — F329 Major depressive disorder, single episode, unspecified: Secondary | ICD-10-CM | POA: Insufficient documentation

## 2015-12-08 DIAGNOSIS — M25519 Pain in unspecified shoulder: Secondary | ICD-10-CM | POA: Diagnosis present

## 2015-12-08 DIAGNOSIS — J449 Chronic obstructive pulmonary disease, unspecified: Secondary | ICD-10-CM | POA: Insufficient documentation

## 2015-12-08 LAB — COMPREHENSIVE METABOLIC PANEL
ALBUMIN: 3.8 g/dL (ref 3.5–5.0)
ALT: 25 U/L (ref 14–54)
ANION GAP: 7 (ref 5–15)
AST: 28 U/L (ref 15–41)
Alkaline Phosphatase: 96 U/L (ref 38–126)
BUN: 11 mg/dL (ref 6–20)
CHLORIDE: 101 mmol/L (ref 101–111)
CO2: 28 mmol/L (ref 22–32)
Calcium: 9.2 mg/dL (ref 8.9–10.3)
Creatinine, Ser: 0.78 mg/dL (ref 0.44–1.00)
GFR calc Af Amer: >60 mL/min (ref >60)
GLUCOSE: 114 mg/dL — AB (ref 65–99)
POTASSIUM: 4 mmol/L (ref 3.5–5.1)
Sodium: 136 mmol/L (ref 135–145)
Total Bilirubin: 0.5 mg/dL (ref 0.3–1.2)
Total Protein: 6.8 g/dL (ref 6.5–8.1)

## 2015-12-08 LAB — SEDIMENTATION RATE: SED RATE: 28 mm/h (ref 0–30)

## 2015-12-08 LAB — MAGNESIUM: Magnesium: 1.7 mg/dL (ref 1.7–2.4)

## 2015-12-08 LAB — C-REACTIVE PROTEIN

## 2015-12-08 MED ORDER — MORPHINE SULFATE ER 30 MG PO TBCR: 30.0000 mg | EXTENDED_RELEASE_TABLET | Freq: Two times a day (BID) | ORAL | Status: DC

## 2015-12-08 NOTE — Assessment & Plan Note (Signed)
The patient has a history of having had 6 surgeries on the right shoulder and 3 on the left.

## 2015-12-08 NOTE — Patient Instructions (Signed)
Prescription x 3 given for MS Contin

## 2015-12-08 NOTE — Progress Notes (Signed)
Safety precautions to be maintained throughout the outpatient stay will include: orient to surroundings, keep bed in low position, maintain call bell within reach at all times, provide assistance with transfer out of bed and ambulation.  
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
## 2015-12-08 NOTE — Progress Notes (Signed)
Did not bring medication, patient states she took the last one this a.m.

## 2015-12-12 LAB — TOXASSURE SELECT 13 (MW), URINE: PDF: 0

## 2015-12-30 NOTE — Progress Notes (Signed)
Quick Note:   Normal fasting (NPO x 8 hours) glucose levels are between 65-99 mg/dl, with 2 hour fasting, levels are usually less than 140 mg/dl. Any random blood glucose level greater than 200 mg/dl is considered to be Diabetes.  ______ 

## 2015-12-30 NOTE — Progress Notes (Signed)
Quick Note:  Lab results reviewed and found to be within normal limits. ______ 

## 2016-02-25 DIAGNOSIS — E785 Hyperlipidemia, unspecified: Secondary | ICD-10-CM | POA: Diagnosis not present

## 2016-02-28 ENCOUNTER — Encounter: Payer: Self-pay | Admitting: Pain Medicine

## 2016-02-28 ENCOUNTER — Ambulatory Visit: Payer: Medicare HMO | Attending: Pain Medicine | Admitting: Pain Medicine

## 2016-02-28 VITALS — BP 126/104 | HR 98 | Temp 98.0°F | Resp 18 | Ht 64.0 in | Wt 116.0 lb

## 2016-02-28 DIAGNOSIS — E782 Mixed hyperlipidemia: Secondary | ICD-10-CM | POA: Insufficient documentation

## 2016-02-28 DIAGNOSIS — K449 Diaphragmatic hernia without obstruction or gangrene: Secondary | ICD-10-CM | POA: Insufficient documentation

## 2016-02-28 DIAGNOSIS — M25512 Pain in left shoulder: Secondary | ICD-10-CM | POA: Insufficient documentation

## 2016-02-28 DIAGNOSIS — M545 Low back pain, unspecified: Secondary | ICD-10-CM

## 2016-02-28 DIAGNOSIS — M542 Cervicalgia: Secondary | ICD-10-CM | POA: Diagnosis present

## 2016-02-28 DIAGNOSIS — F17210 Nicotine dependence, cigarettes, uncomplicated: Secondary | ICD-10-CM | POA: Diagnosis not present

## 2016-02-28 DIAGNOSIS — F419 Anxiety disorder, unspecified: Secondary | ICD-10-CM | POA: Insufficient documentation

## 2016-02-28 DIAGNOSIS — R5383 Other fatigue: Secondary | ICD-10-CM | POA: Insufficient documentation

## 2016-02-28 DIAGNOSIS — R69 Illness, unspecified: Secondary | ICD-10-CM | POA: Diagnosis not present

## 2016-02-28 DIAGNOSIS — M549 Dorsalgia, unspecified: Secondary | ICD-10-CM | POA: Diagnosis present

## 2016-02-28 DIAGNOSIS — F329 Major depressive disorder, single episode, unspecified: Secondary | ICD-10-CM | POA: Diagnosis not present

## 2016-02-28 DIAGNOSIS — Z5181 Encounter for therapeutic drug level monitoring: Secondary | ICD-10-CM | POA: Diagnosis not present

## 2016-02-28 DIAGNOSIS — R0600 Dyspnea, unspecified: Secondary | ICD-10-CM | POA: Insufficient documentation

## 2016-02-28 DIAGNOSIS — M25519 Pain in unspecified shoulder: Secondary | ICD-10-CM | POA: Diagnosis present

## 2016-02-28 DIAGNOSIS — R000 Tachycardia, unspecified: Secondary | ICD-10-CM | POA: Insufficient documentation

## 2016-02-28 DIAGNOSIS — Z79891 Long term (current) use of opiate analgesic: Secondary | ICD-10-CM | POA: Insufficient documentation

## 2016-02-28 DIAGNOSIS — M25511 Pain in right shoulder: Secondary | ICD-10-CM | POA: Insufficient documentation

## 2016-02-28 DIAGNOSIS — M5030 Other cervical disc degeneration, unspecified cervical region: Secondary | ICD-10-CM | POA: Diagnosis not present

## 2016-02-28 DIAGNOSIS — G8929 Other chronic pain: Secondary | ICD-10-CM | POA: Insufficient documentation

## 2016-02-28 DIAGNOSIS — E785 Hyperlipidemia, unspecified: Secondary | ICD-10-CM | POA: Diagnosis not present

## 2016-02-28 DIAGNOSIS — J449 Chronic obstructive pulmonary disease, unspecified: Secondary | ICD-10-CM | POA: Insufficient documentation

## 2016-02-28 DIAGNOSIS — E039 Hypothyroidism, unspecified: Secondary | ICD-10-CM | POA: Insufficient documentation

## 2016-02-28 MED ORDER — MORPHINE SULFATE ER 30 MG PO TBCR: 30.0000 mg | EXTENDED_RELEASE_TABLET | Freq: Two times a day (BID) | ORAL | Status: DC

## 2016-02-28 NOTE — Patient Instructions (Addendum)

## 2016-02-28 NOTE — Progress Notes (Signed)
Patient's Name: [name]  Patient type: Established  MRN: [id number]  Service setting: Ambulatory outpatient  DOB: 09/07/1946  Location: [facility]  DOS: 02/28/2016  Primary Care Physician: Albina Billet, MD  Note by: Kathlen Brunswick. Dossie Arbour, M.D, DABA, DABAPM, DABPM, DABIPP, FIPP  Referring Physician: Albina Billet, MD  Specialty: Board-Certified Interventional Pain Management     Primary Reason(s) for Visit: Encounter for prescription drug management (Level of risk: moderate) CC: Back Pain; Neck Pain; and Shoulder Pain   HPI  Ms. Aquilina is a 70 y.o. year old, female patient, who returns today as an established patient. She has HYPERLIPIDEMIA-MIXED; Tachycardia; DYSPNEA; HYPOTHYROIDISM; Smoking; Fatigue; Anxiety and depression; Encounter for therapeutic drug level monitoring; Long term current use of opiate analgesic; Long term prescription opiate use; Uncomplicated opioid dependence ([place]); Opiate use; Chronic pain; Substance use disorder Risk: High; Chronic pain syndrome; Cervical spondylosis; DDD (degenerative disc disease), cervical; Chronic neck pain (Location of Secondary source of pain); Failed cervical surgery syndrome; Cervical facet syndrome, bilateral; Myofascial pain syndrome, cervical; Chronic low back pain (Location of Primary Source of Pain) (Bilateral) (R>L); Lumbar spondylosis; Impingement syndrome of right shoulder; Low magnesium levels; CRP elevated; Elevated sedimentation rate; Chronic obstructive pulmonary disease (COPD) ([place]); Nicotine dependence; Chronic right shoulder pain; Abnormal nerve conduction studies; and Chronic shoulder pain (Bilateral) (status post multiple surgeries) (R>L) on her problem list.. Her primarily concern today is the Back Pain; Neck Pain; and Shoulder Pain   Pain Assessment: Self-Reported Pain Score: 7 , clinically she looks like a 2/10. Reported level is inconsistent with clinical obrservations Pain Type: Chronic pain Pain  Location: Back (neck) Pain Orientation: Lower Pain Descriptors / Indicators: Shooting, Sharp Pain Frequency: Intermittent  The patient returns to the clinics today for pharmacological management of her chronic pain. She seems to be doing well on her medication regiment and she indicates not needing any changes or interventional therapies for the time being.  Date of Last Visit: 12/08/15 Service Provided on Last Visit: Evaluation, Med Refill  Controlled Substance Pharmacotherapy Assessment  Analgesic: Morphine ER 30 mg every 12 hours (60 mg per day) Pill Count: #7/60 Morphine 30 mg. Filled 02/03/2016 MME/day: 60 mg/day.  Pharmacokinetics: Onset of action (Liberation/Absorption): Within expected pharmacological parameters Time to Peak effect (Distribution): Timing and results are as within normal expected parameters Duration of action (Metabolism/Excretion): Within normal limits for medication Pharmacodynamics: Analgesic Effect: More than 50% Activity Facilitation: Medication(s) allow patient to sit, stand, walk, and do the basic ADLs Perceived Effectiveness: Described as relatively effective, allowing for increase in activities of daily living (ADL) Side-effects or Adverse reactions: None reported Monitoring: [place] PMP: Online review of the past 52-month period conducted. Compliant with practice rules and regulations  UDS Results/interpretation: The patient's last UDS was done on 12/08/2015 and it came back within normal limits with no unexpected results. Medication Assessment Form: Reviewed. Patient indicates being compliant with therapy Treatment compliance: Compliant Risk Assessment: Aberrant Behavior: None observed today Substance Use Disorder (SUD) Risk Level: Low Risk of opioid abuse or dependence: 0.7-3.0% with doses ? 36 MME/day and 6.1-26% with doses ? 120 MME/day. Opioid Risk Tool (ORT) Score: Total Score: 0 Low Risk for SUD (Score <3) Depression Scale Score: PHQ-2:       PHQ-9:       Pharmacologic Plan: No change in therapy, at this time  Laboratory Chemistry  Inflammation Markers Lab Results  Component Value Date   ESRSEDRATE 28 12/08/2015   CRP <0.5 12/08/2015  Renal Function Lab Results  Component Value Date   BUN 11 12/08/2015   CREATININE 0.78 12/08/2015   GFRAA >60 12/08/2015   GFRNONAA >60 12/08/2015    Hepatic Function Lab Results  Component Value Date   AST 28 12/08/2015   ALT 25 12/08/2015   ALBUMIN 3.8 12/08/2015    Electrolytes Lab Results  Component Value Date   NA 136 12/08/2015   K 4.0 12/08/2015   CL 101 12/08/2015   CALCIUM 9.2 12/08/2015   MG 1.7 12/08/2015    Pain Modulating Vitamins No results found for: [place], VD125OH2TOT, [id number], [id number], VITAMINB12  Coagulation Parameters No results found for: INR, LABPROT  Note: I personally reviewed the above data. Results shared with patient.  Meds  The patient has a current medication list which includes the following prescription(s): albuterol-ipratropium, alprazolam, atorvastatin, bupropion, vitamin d3, escitalopram, fluticasone-salmeterol, levothyroxine, magnesium oxide, morphine, morphine, morphine, tizanidine, and trazodone.  Current Outpatient Prescriptions on File Prior to Visit  Medication Sig  . albuterol-ipratropium (COMBIVENT) 18-103 MCG/ACT inhaler Inhale 2 puffs into the lungs every 6 (six) hours as needed.  . ALPRAZolam (XANAX) 1 MG tablet Take 1 mg by mouth 4 (four) times daily as needed.   Marland Kitchen atorvastatin (LIPITOR) 40 MG tablet Take 40 mg by mouth daily.  Marland Kitchen buPROPion (WELLBUTRIN SR) 150 MG 12 hr tablet Take 150 mg by mouth 2 (two) times daily.  . Cholecalciferol (VITAMIN D3) 5000 UNITS TABS Take 1 tablet by mouth daily.  Marland Kitchen escitalopram (LEXAPRO) 10 MG tablet Take 10 mg by mouth daily.   . Fluticasone-Salmeterol (ADVAIR) 100-50 MCG/DOSE AEPB Inhale 1 puff into the lungs every 12 (twelve) hours.  Marland Kitchen levothyroxine (SYNTHROID, LEVOTHROID) 88 MCG  tablet Take 88 mcg by mouth daily.    . magnesium oxide (MAG-OX) 400 MG tablet Take 400 mg by mouth 2 (two) times daily.  Marland Kitchen tiZANidine (ZANAFLEX) 4 MG tablet Take 4 mg by mouth every 8 (eight) hours as needed for muscle spasms.  . traZODone (DESYREL) 150 MG tablet Take 150 mg by mouth at bedtime.     No current facility-administered medications on file prior to visit.    ROS  Constitutional: Afebrile, no chills, well hydrated and well nourished Gastrointestinal: No upper or lower GI bleeding, no nausea, no vomiting and no acute GI distress Musculoskeletal: No acute joint swelling or redness, no acute loss of range of motion and no acute onset weakness Neurological: Denies any acute onset apraxia, no episodes of paralysis, no acute loss of coordination, no acute loss of consciousness and no acute onset aphasia, dysarthria, agnosia, or amnesia  Allergies  Ms. Evatt has No Known Allergies.  [place]  Medical:  Ms. Royalty  has a past medical history of Hyperthyroidism; Dyslipidemia; Esophageal spasm; History of peptic ulcer disease; Depression; Pelvic fracture ([place]) (2008); Hiatal hernia; Carpal tunnel syndrome; Rotator cuff injury; Complication of anesthesia; COPD (chronic obstructive pulmonary disease) ([place]); Dysrhythmia; Gastrointestinal parasites; Hypothyroidism; Anxiety; CAD (coronary artery disease); Reflux; Low magnesium levels (09/14/2015); CRP elevated (09/14/2015); and Elevated sedimentation rate (09/14/2015). Family: family history is not on file. Surgical:  has past surgical history that includes Cesarean section; Neck surgery; Cholecystectomy; Carpal tunnel release; Rotator cuff repair; Shoulder arthroscopy (12/07/2011); and Shoulder surgery (12/07/2011). Tobacco:  reports that she has been smoking Cigarettes.  She has been smoking about 0.50 packs per day. She does not have any smokeless tobacco history on file. Alcohol:  reports that she does not drink alcohol. Drug:  reports that she does  not use  illicit drugs.  Physical Examination  Constitutional Vitals:  Today's Vitals   02/28/16 0944 02/28/16 0947  BP: 126/104   Pulse: 98   Temp: 98 F (36.7 C)   TempSrc: Oral   Resp: 18   Height: 5\' 4"  (1.626 m)   Weight: 116 lb (52.617 kg)   SpO2: 93%   PainSc: 7  7   PainLoc: Back    Calculated BMI: Body mass index is 19.9 kg/(m^2).    General appearance: alert, cooperative, appears stated age and no distress Eyes: PERLA Respiratory: No evidence respiratory distress, no audible rales or ronchi and no use of accessory muscles of respiration  Cervical Spine Exam  Inspection: Normal anatomy, no anomalies observed Cervical Lordosis: Normal Alignment: Symetrical Functional ROM: Within functional limits (WFL) AROM: WFL Sensory: No sensory abnormalities reported  Upper Extremity Exam    Right  Left  Inspection: No gross anomalies detected  Inspection: No gross anomalies detected  Functional ROM: Adequate  Functional ROM: Adequate  AROM: Adequate  AROM: Adequate  Sensory: Normal  No sensory abnormalities reported  Sensory: Normal  No sensory abnormalities reported  Motor: Unremarkable  Motor: Unremarkable  Vascular: Normal skin color, temperature, and hair growth. No peripheral edema or cyanosis  Vascular: Normal skin color, temperature, and hair growth. No peripheral edema or cyanosis   Thoracic Spine  Inspection: No gross anomalies detected Alignment: Symetrical Functional ROM: Within functional limits [facility]) AROM: Adequate Palpation: WNL  Lumbar Spine  Inspection: No gross anomalies detected Alignment: Symetrical Functional ROM: Within functional limits [facility]) AROM: Adequate Palpation: WNL Provocative Tests: Lumbar Hyperextension and rotation test: deferred Patrick's Maneuver: deferred  Gait Assessment  Gait: WNL  Lower Extremities    Right  Left  Inspection: No gross anomalies detected  Inspection: No gross anomalies detected  Functional ROM: Within  functional limits [facility])  Functional ROM: Within functional limits (WFL)  AROM: Adequate  AROM: Adequate  Sensory: Normal  Sensory: Normal  Motor: Unremarkable  Motor: Unremarkable   Assessment & Plan  Primary Diagnosis & Pertinent Problem List: The primary encounter diagnosis was Chronic pain. Diagnoses of Encounter for therapeutic drug level monitoring, Long term current use of opiate analgesic, and Chronic low back pain (Location of Primary Source of Pain) (Bilateral) (R>L) were also pertinent to this visit.  Visit Diagnosis: 1. Chronic pain   2. Encounter for therapeutic drug level monitoring   3. Long term current use of opiate analgesic   4. Chronic low back pain (Location of Primary Source of Pain) (Bilateral) (R>L)     Problem-specific Plan(s): No problem-specific assessment & plan notes found for this encounter.   Plan of Care   Problem List Items Addressed This Visit      High   Chronic pain - Primary (Chronic)   Relevant Medications   morphine (MS CONTIN) 30 MG 12 hr tablet   morphine (MS CONTIN) 30 MG 12 hr tablet   morphine (MS CONTIN) 30 MG 12 hr tablet   Chronic low back pain (Location of Primary Source of Pain) (Bilateral) (R>L) (Chronic)   Relevant Medications   morphine (MS CONTIN) 30 MG 12 hr tablet   morphine (MS CONTIN) 30 MG 12 hr tablet   morphine (MS CONTIN) 30 MG 12 hr tablet     Medium   Long term current use of opiate analgesic (Chronic)   Relevant Orders   ToxASSURE Select 13 (MW), Urine   Encounter for therapeutic drug level monitoring       Pharmacotherapy (Medications  Ordered): Meds ordered this encounter  Medications  . morphine (MS CONTIN) 30 MG 12 hr tablet    Sig: Take 1 tablet (30 mg total) by mouth every 12 (twelve) hours.    Dispense:  60 tablet    Refill:  0    Do not place this medication, or any other prescription from our practice, on "Automatic Refill". Patient may have prescription filled one day early if pharmacy is closed  on scheduled refill date. Do not fill until: 03/04/16 To last until: 04/03/16  . morphine (MS CONTIN) 30 MG 12 hr tablet    Sig: Take 1 tablet (30 mg total) by mouth every 12 (twelve) hours.    Dispense:  60 tablet    Refill:  0    Do not place this medication, or any other prescription from our practice, on "Automatic Refill". Patient may have prescription filled one day early if pharmacy is closed on scheduled refill date. Do not fill until: 04/03/16 To last until: 05/03/16  . morphine (MS CONTIN) 30 MG 12 hr tablet    Sig: Take 1 tablet (30 mg total) by mouth every 12 (twelve) hours.    Dispense:  60 tablet    Refill:  0    Do not place this medication, or any other prescription from our practice, on "Automatic Refill". Patient may have prescription filled one day early if pharmacy is closed on scheduled refill date. Do not fill until: 05/03/16 To last until: 06/02/16   [facility] & Procedure Ordered: Orders Placed This Encounter  Procedures  . ToxASSURE Select 13 (MW), Urine    Imaging Ordered: None  Interventional Therapies: Scheduled:  None at this time.    Considering:  None at this time.    PRN Procedures:  None at this time.    Referral(s) or Consult(s): None at this time.  New Prescriptions   No medications on file    Medications administered during this visit: Ms. Krolczyk had no medications administered during this visit.  Future Appointments Date Time Provider [place]  05/29/2016 1:20 PM Milinda Pointer, MD [facility] None    Primary Care Physician: Albina Billet, MD Location: [facility] Note by: Kathlen Brunswick. Dossie Arbour, M.D, DABA, DABAPM, DABPM, DABIPP, FIPP  Pain Score Disclaimer: We use the NRS-11 scale. This is a self-reported, subjective measurement of pain severity with only modest accuracy. It is used primarily to identify changes within a particular patient. It must be understood that outpatient pain scales are  significantly less accurate that those used for research, where they can be applied under ideal controlled circumstances with minimal exposure to variables. In reality, the score is likely to be a combination of pain intensity and pain affect, where pain affect describes the degree of emotional arousal or changes in action readiness caused by the sensory experience of pain. Factors such as social and work situation, setting, emotional state, anxiety levels, expectation, and prior pain experience may influence pain perception and show large inter-individual differences that may also be affected by time variables.

## 2016-02-28 NOTE — Progress Notes (Signed)
Safety precautions to be maintained throughout the outpatient stay will include: orient to surroundings, keep bed in low position, maintain call bell within reach at all times, provide assistance with transfer out of bed and ambulation.  #7/60   Morphine 30 mg. Filled 02/03/2016

## 2016-03-01 DIAGNOSIS — R7309 Other abnormal glucose: Secondary | ICD-10-CM | POA: Diagnosis not present

## 2016-03-01 DIAGNOSIS — R69 Illness, unspecified: Secondary | ICD-10-CM | POA: Diagnosis not present

## 2016-03-01 DIAGNOSIS — E785 Hyperlipidemia, unspecified: Secondary | ICD-10-CM | POA: Diagnosis not present

## 2016-03-01 DIAGNOSIS — E039 Hypothyroidism, unspecified: Secondary | ICD-10-CM | POA: Diagnosis not present

## 2016-03-04 LAB — TOXASSURE SELECT 13 (MW), URINE: PDF: 0

## 2016-05-01 DIAGNOSIS — E785 Hyperlipidemia, unspecified: Secondary | ICD-10-CM | POA: Diagnosis not present

## 2016-05-01 DIAGNOSIS — E039 Hypothyroidism, unspecified: Secondary | ICD-10-CM | POA: Diagnosis not present

## 2016-05-18 ENCOUNTER — Ambulatory Visit: Payer: Medicare HMO | Attending: Pain Medicine | Admitting: Pain Medicine

## 2016-05-18 ENCOUNTER — Telehealth: Payer: Self-pay

## 2016-05-18 ENCOUNTER — Encounter: Payer: Self-pay | Admitting: Pain Medicine

## 2016-05-18 VITALS — BP 156/80 | HR 90 | Temp 98.2°F | Resp 18 | Ht 64.0 in | Wt 115.0 lb

## 2016-05-18 DIAGNOSIS — E785 Hyperlipidemia, unspecified: Secondary | ICD-10-CM | POA: Insufficient documentation

## 2016-05-18 DIAGNOSIS — M47812 Spondylosis without myelopathy or radiculopathy, cervical region: Secondary | ICD-10-CM | POA: Diagnosis not present

## 2016-05-18 DIAGNOSIS — M47892 Other spondylosis, cervical region: Secondary | ICD-10-CM

## 2016-05-18 DIAGNOSIS — J449 Chronic obstructive pulmonary disease, unspecified: Secondary | ICD-10-CM | POA: Insufficient documentation

## 2016-05-18 DIAGNOSIS — Z9889 Other specified postprocedural states: Secondary | ICD-10-CM | POA: Diagnosis not present

## 2016-05-18 DIAGNOSIS — R200 Anesthesia of skin: Secondary | ICD-10-CM | POA: Insufficient documentation

## 2016-05-18 DIAGNOSIS — M7540 Impingement syndrome of unspecified shoulder: Secondary | ICD-10-CM | POA: Diagnosis not present

## 2016-05-18 DIAGNOSIS — M4806 Spinal stenosis, lumbar region: Secondary | ICD-10-CM | POA: Diagnosis not present

## 2016-05-18 DIAGNOSIS — G8929 Other chronic pain: Secondary | ICD-10-CM | POA: Diagnosis not present

## 2016-05-18 DIAGNOSIS — M4316 Spondylolisthesis, lumbar region: Secondary | ICD-10-CM | POA: Insufficient documentation

## 2016-05-18 DIAGNOSIS — Z981 Arthrodesis status: Secondary | ICD-10-CM | POA: Insufficient documentation

## 2016-05-18 DIAGNOSIS — Z5181 Encounter for therapeutic drug level monitoring: Secondary | ICD-10-CM

## 2016-05-18 DIAGNOSIS — M4310 Spondylolisthesis, site unspecified: Secondary | ICD-10-CM

## 2016-05-18 DIAGNOSIS — M5126 Other intervertebral disc displacement, lumbar region: Secondary | ICD-10-CM | POA: Insufficient documentation

## 2016-05-18 DIAGNOSIS — Z79891 Long term (current) use of opiate analgesic: Secondary | ICD-10-CM | POA: Diagnosis not present

## 2016-05-18 DIAGNOSIS — E782 Mixed hyperlipidemia: Secondary | ICD-10-CM | POA: Diagnosis not present

## 2016-05-18 DIAGNOSIS — F17210 Nicotine dependence, cigarettes, uncomplicated: Secondary | ICD-10-CM | POA: Diagnosis not present

## 2016-05-18 DIAGNOSIS — M48061 Spinal stenosis, lumbar region without neurogenic claudication: Secondary | ICD-10-CM

## 2016-05-18 DIAGNOSIS — M545 Low back pain, unspecified: Secondary | ICD-10-CM

## 2016-05-18 DIAGNOSIS — M25511 Pain in right shoulder: Secondary | ICD-10-CM | POA: Diagnosis not present

## 2016-05-18 DIAGNOSIS — M791 Myalgia: Secondary | ICD-10-CM

## 2016-05-18 DIAGNOSIS — Q762 Congenital spondylolisthesis: Secondary | ICD-10-CM

## 2016-05-18 DIAGNOSIS — M4802 Spinal stenosis, cervical region: Secondary | ICD-10-CM | POA: Diagnosis not present

## 2016-05-18 DIAGNOSIS — M542 Cervicalgia: Secondary | ICD-10-CM | POA: Diagnosis not present

## 2016-05-18 DIAGNOSIS — M549 Dorsalgia, unspecified: Secondary | ICD-10-CM | POA: Diagnosis present

## 2016-05-18 DIAGNOSIS — M961 Postlaminectomy syndrome, not elsewhere classified: Secondary | ICD-10-CM

## 2016-05-18 DIAGNOSIS — E612 Magnesium deficiency: Secondary | ICD-10-CM | POA: Diagnosis not present

## 2016-05-18 DIAGNOSIS — R5383 Other fatigue: Secondary | ICD-10-CM | POA: Diagnosis not present

## 2016-05-18 DIAGNOSIS — R700 Elevated erythrocyte sedimentation rate: Secondary | ICD-10-CM | POA: Diagnosis not present

## 2016-05-18 DIAGNOSIS — M47816 Spondylosis without myelopathy or radiculopathy, lumbar region: Secondary | ICD-10-CM | POA: Insufficient documentation

## 2016-05-18 DIAGNOSIS — F418 Other specified anxiety disorders: Secondary | ICD-10-CM | POA: Insufficient documentation

## 2016-05-18 DIAGNOSIS — M25512 Pain in left shoulder: Secondary | ICD-10-CM | POA: Diagnosis not present

## 2016-05-18 DIAGNOSIS — M2578 Osteophyte, vertebrae: Secondary | ICD-10-CM | POA: Diagnosis not present

## 2016-05-18 DIAGNOSIS — M47896 Other spondylosis, lumbar region: Secondary | ICD-10-CM

## 2016-05-18 DIAGNOSIS — M25519 Pain in unspecified shoulder: Secondary | ICD-10-CM | POA: Diagnosis present

## 2016-05-18 DIAGNOSIS — G9619 Other disorders of meninges, not elsewhere classified: Secondary | ICD-10-CM | POA: Insufficient documentation

## 2016-05-18 DIAGNOSIS — M7918 Myalgia, other site: Secondary | ICD-10-CM

## 2016-05-18 DIAGNOSIS — E039 Hypothyroidism, unspecified: Secondary | ICD-10-CM | POA: Diagnosis not present

## 2016-05-18 DIAGNOSIS — M5382 Other specified dorsopathies, cervical region: Secondary | ICD-10-CM

## 2016-05-18 MED ORDER — MORPHINE SULFATE ER 30 MG PO TBCR: 30.0000 mg | EXTENDED_RELEASE_TABLET | Freq: Two times a day (BID) | ORAL | Status: DC

## 2016-05-18 MED ORDER — TIZANIDINE HCL 2 MG PO CAPS: 2.0000 mg | ORAL_CAPSULE | Freq: Three times a day (TID) | ORAL | Status: DC | PRN

## 2016-05-18 NOTE — Telephone Encounter (Signed)
Dr. Dossie Arbour said patient should see her PCP for cough med. She should tell her PCP not to prescribe anything with narcotics in it.  Attempted to call patient, no answer.

## 2016-05-18 NOTE — Telephone Encounter (Signed)
Nurses I believe this patient is a patient of Dr.Naveira. Please let me know if I need to assist with management of patient  Thank you

## 2016-05-18 NOTE — Progress Notes (Signed)
Patient's Name: [name]  Patient type: Established  MRN: [id number]  Service setting: Ambulatory outpatient  DOB: 20-Jun-1946  Location: [facility]  DOS: 05/18/2016  Primary Care Physician: Albina Billet, MD  Note by: Kathlen Brunswick. Dossie Arbour, M.D, DABA, DABAPM, DABPM, DABIPP, [place]  Referring Physician: Albina Billet, MD  Specialty: Board-Certified Interventional Pain Management  Last Visit to Pain Management: 02/28/2016   Primary Reason(s) for Visit: Encounter for prescription drug management (Level of risk: moderate) CC: Neck Pain; Back Pain; Shoulder Pain; and Hip Pain   HPI  [name] is a 70 y.o. year old, female patient, who returns today as an established patient. 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); Cervical facet syndrome (Location of Secondary source of pain) (Bilateral) (R>L); Cervical myofascial pain syndrome; Chronic low back pain (Location of Primary Source of Pain) (Bilateral) (R>L); Lumbar spondylosis; Chronic shoulder impingement syndrome (Right); Low magnesium levels; CRP elevated; Elevated sedimentation rate; Chronic obstructive pulmonary disease (COPD) ([place]); Nicotine dependence; Chronic shoulder pain (Right); Abnormal nerve conduction studies; Chronic shoulder pain (Bilateral) (status post multiple surgeries) (R>L); Cervical facet hypertrophy (Bilateral); History of shoulder surgery 5 (Right); Lumbar foraminal stenosis (L3-4) (Left); 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 Her primarily concern today is the Neck Pain; Back Pain; Shoulder Pain; and Hip Pain   Pain Assessment: Self-Reported Pain Score: 7 , clinically she looks like a 2/10. Reported level is inconsistent with clinical obrservations Information on the proper use of the pain score provided to the patient today. Pain Type: Chronic pain Pain Location: Neck Pain Descriptors / Indicators: Pounding Pain Frequency: Constant  The patient comes into the clinics today for pharmacological management of her chronic pain. I last saw this patient on 02/28/2016. The patient  reports that she does not use illicit drugs. Her body mass index is 19.73 kg/(m^2).  Date of Last Visit: 02/28/16 Service Provided on Last Visit: Med Refill  Controlled Substance Pharmacotherapy Assessment & REMS (Risk Evaluation and Mitigation Strategy)  Analgesic: Morphine ER 30 mg every 12 hours (60 mg per day) MME/day: 60 mg/day.  Pill Count: Did not bring medication bottles to this appointment. Final warning given today. Pharmacokinetics: Onset of action (Liberation/Absorption): Within expected pharmacological parameters Time to Peak effect (Distribution): Timing and results are as within normal expected parameters Duration of action (Metabolism/Excretion): Within normal limits for medication Pharmacodynamics: Analgesic Effect: More than 50% Activity Facilitation: Medication(s) allow patient to sit, stand, walk, and do the basic ADLs Perceived Effectiveness: Described as relatively effective, allowing for increase in activities of daily living (ADL) Side-effects or Adverse reactions: None reported Monitoring: [place] PMP: Online review of the past 41-month period conducted. Compliant with practice rules and regulations Last UDS on  record: TOXASSURE SELECT 13  Date Value Ref Range Status  02/28/2016 FINAL  Final    Comment:    ==================================================================== TOXASSURE SELECT 13 (MW) ==================================================================== Test  Result       Flag       Units Drug Present and Declared for Prescription Verification   Alprazolam                     188          EXPECTED   ng/mg creat   Alpha-hydroxyalprazolam        733          EXPECTED   ng/mg creat    Source of alprazolam is a scheduled prescription medication.    Alpha-hydroxyalprazolam is an expected metabolite of alprazolam.   Morphine                       >6803        EXPECTED   ng/mg creat    Potential sources of large amounts of morphine in the absence of    codeine include administration of morphine or use of heroin.   Hydromorphone                  211          EXPECTED   ng/mg creat    Hydromorphone may be present as a metabolite of morphine;    concentrations of hydromorphone rarely exceed 5% of the morphine    concentration when this is the source of hydromorphone. ==================================================================== Test                      Result    Flag   Units      Ref Range   Creatinine              147              mg/dL      >=20 ==================================================================== Declared Medications:  The flagging and interpretation on this report are based on the  following declared medications.  Unexpected results may arise from  inaccuracies in the declared medications.  **Note: The testing scope of this panel includes these medications:  Alprazolam (Xanax)  Morphine (MS Contin)  **Note: The testing scope of this panel does not include following  reported medications:  Albuterol (Combivent)  Atorvastatin (Lipitor)  Bupropion (Wellbutrin)  Cholecalciferol  Citalopram (Lexapro)  Fluticasone (Advair)   Ipratropium (Combivent)  Levothyroxine (Synthroid)  Magnesium (Mag-Ox)  Salmeterol (Advair)  Tizanidine (Zanaflex)  Trazodone (Desyrel) ==================================================================== For clinical consultation, please call [phone]. ====================================================================    UDS interpretation: Compliant          Medication Assessment Form: Reviewed. Patient indicates being compliant with therapy Treatment compliance: Compliant Risk Assessment: Aberrant Behavior: None observed today Substance Use Disorder (SUD) Risk Level: Low-to-moderate Risk of opioid abuse or dependence: 0.7-3.0% with doses ? 36 MME/day and 6.1-26% with doses ? 120 MME/day. Opioid Risk Tool (ORT) Score: Total Score: 3 Low Risk for SUD (Score <3) Depression Scale Score: PHQ-2: PHQ-2 Total Score: 0 No depression (0) PHQ-9: PHQ-9 Total Score: 0 No depression (0-4)  Pharmacologic Plan: No change in therapy, at this time  Laboratory Chemistry  Inflammation Markers Lab Results  Component Value Date   ESRSEDRATE 28 12/08/2015   CRP <0.5 12/08/2015    Renal Function Lab Results  Component Value Date   BUN 11 12/08/2015   CREATININE 0.78 12/08/2015   GFRAA >60 12/08/2015   GFRNONAA >60 12/08/2015    Hepatic Function Lab Results  Component Value Date   AST 28 12/08/2015   ALT  25 12/08/2015   ALBUMIN 3.8 12/08/2015    Electrolytes Lab Results  Component Value Date   NA 136 12/08/2015   K 4.0 12/08/2015   CL 101 12/08/2015   CALCIUM 9.2 12/08/2015   MG 1.7 12/08/2015    Pain Modulating Vitamins No results found for: [place], VD125OH2TOT, [id number], [id number], 25OHVITD1, 25OHVITD2, 25OHVITD3, VITAMINB12  Coagulation Parameters No results found for: INR, LABPROT, APTT, PLT  Note: Labs Reviewed.  Recent Diagnostic Imaging  Cervical Imaging: Cervical MR wo contrast:  Results for orders placed during the hospital encounter of 04/21/05  MR  Cervical Spine Wo Contrast   Narrative Clinical Data:    Evaluate for cervical or lumbar spondylosis.  Bilateral arm and left leg numbness.  Technique:    Multiplanar, multisequence imaging was performed of the entire cervical and lumbar spine using surface coils without IV contrast. Comparison:    Plain film radiograph of cervical spine of 04/26/04. Findings:     MRI OF THE CERVICAL SPINE WITHOUT CONTRAST: The patient is status post anterior cervical diskectomy and fusion from C4 to C6.  This creates some artifact.  The craniocervical junction is otherwise within normal limits.  Normal signal is present throughout the visualized spinal cord.  Axial images reveal a 10 x 11 mm T2 bright lesion in the superior aspect of the left lobe of the thyroid gland.  This is nonspecific, but could be further evaluated with ultrasound imaging.  C2-3:    Negative.  C3-4:    A central disc osteophyte complex at this level creates an element of central canal narrowing.  The neural foramen are patent.  C4-5:    No significant central canal or neural foraminal narrowing is noted.   C5-6:    C5-6 is obscured by metal artifact.  C6-7:    A broad-based disc osteophyte complex creates mild central canal narrowing and minimal right-sided neural foraminal narrowing.   C7-T1:    Left-sided uncovertebral spurring creates minimal left neural foraminal narrowing.  IMPRESSION: 1.  Status post anterior cervical diskectomy and fusion from C4 to C6.   2.  Minimal central canal narrowing secondary to disc osteophyte complex at C3-4.  3.  Broad-based disc osteophyte complex at C6-7 creates mild central canal narrowing and minimal right neural foraminal narrowing.  4.  Uncovertebral spurring on the left at C7-T1 creates minimal neural foraminal narrowing. 5.  Indeterminate left lobe thyroid nodule.  Ultrasound may be of use for further evaluation as clinically indicated.  MRI OF THE LUMBAR SPINE WITHOUT CONTRAST: Scout images  reveal a 3.8 cm cystic mass involving the lateral aspect of the right kidney.  This is incompletely characterized on this study.  Ultrasound or CT with and without contrast may be of use for further evaluation as clinically indicated.  Normal signal is present in the conus medullaris which terminates appropriately at T12-L1.  Normal signal and height is preserved in the vertebral bodies.  A second smaller cystic lesion is seen near the upper pole of the right kidney.   L1-2:    Negative.  L2-3:    A broad-based disc bulge and mild ligamentum flavum thickening are noted without significant central canal or neural foraminal narrowing.   L3-4:    Minimal anterolisthesis results in some uncovering of the disc at this level.  A small effusion is also noted in the left facet joint at this level.  Mild central canal narrowing is present along with moderate left neural foraminal narrowing. L4-5:    A  mild broad-based disc bulge is noted along with some facet hypertrophy.  Minimal bilateral neural foraminal and central canal narrowing is evident.  L5-S1:    No significant central canal or neural foraminal narrowing is present.  IMPRESSION: 1.  Moderate left neural foraminal narrowing at L3-4. 2.  Mild central canal narrowing at L3-4 and L4-5 secondary to disc bulges and facet hypertrophy.  Provider: Margaretmary Eddy   Cervical MR w/wo contrast:  Results for orders placed in visit on 02/10/14  MR Cervical Spine W Wo Contrast   Narrative * PRIOR REPORT IMPORTED FROM AN EXTERNAL SYSTEM *   CLINICAL DATA:  Neck pain with headaches and arm weakness. History  of cervical spine surgery x3. Evaluate epidural fibrosis.   EXAM:  MRI CERVICAL SPINE WITHOUT AND WITH CONTRAST   TECHNIQUE:  Multiplanar and multiecho pulse sequences of the cervical spine, to  include the craniocervical junction and cervicothoracic junction,  were obtained according to standard protocol without and with  intravenous contrast.    CONTRAST:  13 ml MultiHance.   COMPARISON:  Cervical MRI 05/16/2006.   FINDINGS:  The cervical alignment is stable and near anatomic status post C3-7  ACDF. The associated hardware artifact is stable. There is no  evidence of acute fracture.   The craniocervical junction appears normal. The cervical cord is  normal in signal and caliber. Post-contrast, there is no abnormal  intradural enhancement. There is no significant dural thickening.  There are bilateral vertebral artery flow voids.   There is apparent synovial thickening surrounding the odontoid  process with probable osseous erosion asymmetric to the right, best  seen on the sagittal inversion recovery and postcontrast images.  C1-2 is not included on the axial images and is suboptimally  evaluated. There is no mass effect on the proximal cervical cord.   C2-3: Mild disc bulging and moderate facet hypertrophy asymmetric to  the left. There is minimal left foraminal stenosis. No cord  deformity.   C3-4: Fusion appears solid status post ACDF. There is mild chronic  biforaminal stenosis.   C4-5: Interbody fusion appears solid. No significant spinal  stenosis.   C5-6: Interbody fusion appears solid. No significant spinal  stenosis.   C6-7: Interbody fusion appears solid. There is mild osseous  foraminal narrowing bilaterally.   C7-T1: Mild adjacent segment disease with mild disc bulging and  bilateral facet hypertrophy. There is a probable small left  paracentral disc protrusion. No cord deformity results. Both  foramina are mildly narrowed.   IMPRESSION:  1. Stable alignment status post C3-7 ACDF.  Fusion appears solid.  2. Apparent progressive synovial thickening surrounding the odontoid  process with probable osseous erosion on the right. The C1-2  findings are not imaged in the axial plane and incompletely  evaluated. Consider CT for further evaluation.  3. Mild adjacent segment disease at C7-T1 with small  left  paracentral disc protrusion and mild biforaminal stenosis. No cord  deformity.  4. No significant dural thickening or abnormal intradural  enhancement identified.    Electronically Signed    By: Camie Patience M.D.    On: 02/10/2014 17:32       Shoulder Imaging: Shoulder-R MR wo contrast:  Results for orders placed in visit on 02/10/14  MR Shoulder Right Wo Contrast   Narrative * PRIOR REPORT IMPORTED FROM AN EXTERNAL SYSTEM *   CLINICAL DATA:  Right shoulder pain with weakness and decreased  range of motion. Right shoulder surgery x5.   EXAM:  MRI OF  THE RIGHT SHOULDER WITHOUT CONTRAST   TECHNIQUE:  Multiplanar, multisequence MR imaging of the shoulder was performed.  No intravenous contrast was administered.   COMPARISON:  None.   FINDINGS:  There are postsurgical findings suggesting previous distal clavicle  resection and rotator cuff repair.   Rotator cuff: There is tendinosis and/or postsurgical change within  the supraspinatus and infraspinous tendons. No recurrent  full-thickness tendon tear or tendon retraction identified. The  subscapularis and teres minor tendons demonstrate no significant  findings.   Muscles:  Mild generalized shoulder atrophy appears nonfocal.   Biceps long head: The intra-articular portion of the biceps tendon  is not visualized. There is a small remnant of the tendon within the  bicipital groove. No specific signs of prior tenodesis demonstrated.   Acromioclavicular Joint: Previous distal clavicle resection. There  is a large amount of fluid in the subacromial-subdeltoid space. A  small amount of fluid is present within the subcoracoid bursa.   Glenohumeral Joint: There are moderate glenohumeral degenerative  changes with subchondral cyst formation superiorly in the glenoid.  There is a moderate size shoulder joint effusion with synovial  irregularity in the superior subscapularis recess. No ossified loose  bodies identified.    Labrum: There is diffuse labral degeneration. The superior labrum is  blunted. No paralabral cyst identified.   Bones:  No significant extra-articular osseous findings.   IMPRESSION:  1. Postsurgical findings consistent with previous rotator cuff  repair and distal clavicle resection.  2. No evidence of recurrent full-thickness rotator cuff tear. There  is tendinosis and/or postsurgical change in the supraspinatus and  infraspinous tendons. There is a large amount of fluid in the  subacromial -subdeltoid space.  3. Probable complete biceps tendon rupture versus prior bicipital  tenotomy.  4. Glenohumeral degenerative changes with diffuse labral  degeneration.    Electronically Signed    By: Camie Patience M.D.    On: 02/10/2014 16:20       Lumbosacral Imaging: Lumbar MR wo contrast:  Results for orders placed during the hospital encounter of 04/21/05  MR Lumbar Spine Wo Contrast   Narrative Clinical Data:    Evaluate for cervical or lumbar spondylosis.  Bilateral arm and left leg numbness.  Technique:    Multiplanar, multisequence imaging was performed of the entire cervical and lumbar spine using surface coils without IV contrast. Comparison:    Plain film radiograph of cervical spine of 04/26/04. Findings:     MRI OF THE CERVICAL SPINE WITHOUT CONTRAST: The patient is status post anterior cervical diskectomy and fusion from C4 to C6.  This creates some artifact.  The craniocervical junction is otherwise within normal limits.  Normal signal is present throughout the visualized spinal cord.  Axial images reveal a 10 x 11 mm T2 bright lesion in the superior aspect of the left lobe of the thyroid gland.  This is nonspecific, but could be further evaluated with ultrasound imaging.  C2-3:    Negative.  C3-4:    A central disc osteophyte complex at this level creates an element of central canal narrowing.  The neural foramen are patent.  C4-5:    No significant central canal or  neural foraminal narrowing is noted.   C5-6:    C5-6 is obscured by metal artifact.  C6-7:    A broad-based disc osteophyte complex creates mild central canal narrowing and minimal right-sided neural foraminal narrowing.   C7-T1:    Left-sided uncovertebral spurring creates minimal left neural foraminal narrowing.  IMPRESSION:  1.  Status post anterior cervical diskectomy and fusion from C4 to C6.   2.  Minimal central canal narrowing secondary to disc osteophyte complex at C3-4.  3.  Broad-based disc osteophyte complex at C6-7 creates mild central canal narrowing and minimal right neural foraminal narrowing.  4.  Uncovertebral spurring on the left at C7-T1 creates minimal neural foraminal narrowing. 5.  Indeterminate left lobe thyroid nodule.  Ultrasound may be of use for further evaluation as clinically indicated.  MRI OF THE LUMBAR SPINE WITHOUT CONTRAST: Scout images reveal a 3.8 cm cystic mass involving the lateral aspect of the right kidney.  This is incompletely characterized on this study.  Ultrasound or CT with and without contrast may be of use for further evaluation as clinically indicated.  Normal signal is present in the conus medullaris which terminates appropriately at T12-L1.  Normal signal and height is preserved in the vertebral bodies.  A second smaller cystic lesion is seen near the upper pole of the right kidney.   L1-2:    Negative.  L2-3:    A broad-based disc bulge and mild ligamentum flavum thickening are noted without significant central canal or neural foraminal narrowing.   L3-4:    Minimal anterolisthesis results in some uncovering of the disc at this level.  A small effusion is also noted in the left facet joint at this level.  Mild central canal narrowing is present along with moderate left neural foraminal narrowing. L4-5:    A mild broad-based disc bulge is noted along with some facet hypertrophy.  Minimal bilateral neural foraminal and central canal narrowing is  evident.  L5-S1:    No significant central canal or neural foraminal narrowing is present.  IMPRESSION: 1.  Moderate left neural foraminal narrowing at L3-4. 2.  Mild central canal narrowing at L3-4 and L4-5 secondary to disc bulges and facet hypertrophy.  Provider: Margaretmary Eddy   Lumbar MR wo contrast:  Results for orders placed in visit on 12/15/10  [place] W/O Cm   Narrative * PRIOR REPORT IMPORTED FROM AN EXTERNAL SYSTEM *   PRIOR REPORT IMPORTED FROM THE [facility] WORKFLOW SYSTEM   REASON FOR EXAM:    lumbar spondylosis and low back pain  COMMENTS:   PROCEDURE:     MR  - MR LUMBAR SPINE WO CONTRAST  - Dec 15 2010 11:09AM   RESULT:     Comparison: 05/03/2006   Technique: Standard lumbar spine protocol, without administration of IV  contrast.   Findings:  Bone marrow signal is within normal limits. There is approximately 3 mm of  anterolisthesis of L3 on L4. Disc desiccation is seen at L2-L3, L3-L4,  L4-L5, and L5-S1. The conus termination is normal.   T12-L1: No significant disc bulge or neuroforaminal narrowing.   L1-L2: No significant disc bulge or neuroforaminal narrowing.   L2-L3: Minimal posterior disc bulge causes minimal flattening of the  ventral  thecal sac. No neuroforaminal narrowing.   L3-L4: There mild posterior disc bulge and pseudodisc secondary to  anterolisthesis cause flattening of the ventral thecal sac. This in  combination with mild degenerative facet disease causes mild to moderate  canal stenosis. There is moderate left-sided neuroforaminal narrowing.  These  findings are similar to prior.   L4-L5: Mild posterior disc bulge causes flattening of the ventral thecal  sac. This in combination with mild degenerative disease cause mild canal  stenosis. There is mild left neuroforaminal narrowing.   L5-S1: No significant posterior disc bulge.  No neuroforaminal narrowing.   Small T2 hyperintense structure in the left kidney is incompletely   characterized, but likely represents a cyst.   IMPRESSION:  1. Unchanged multilevel degenerative disc and facet disease with mild to  moderate canal stenosis at L3-L4 and mild canal stenosis at L4-L5.  2. Unchanged moderate left-sided neuroforaminal narrowing at L3-L4.       Lumbar DG 2-3 views:  Results for orders placed in visit on 02/10/14  DG Lumbar Spine 2-3 Views   Narrative * PRIOR REPORT IMPORTED FROM AN EXTERNAL SYSTEM *   CLINICAL DATA:  Back pain   EXAM:  LUMBAR SPINE - 2-3 VIEW   COMPARISON:  None.   FINDINGS:  Anatomic alignment. Moderate osteopenia. No vertebral compression  deformity. Minimal degenerative change of the SI joints. Mild  narrowing of the L4-5 disc. Moderate narrowing of the L5-S1 disc.   IMPRESSION:  No acute bony pathology.  Mild chronic changes.    Electronically Signed    By: Maryclare Bean M.D.    On: 02/10/2014 16:25       Note: Imaging reviewed.  Meds  The patient has a current medication list which includes the following prescription(s): albuterol-ipratropium, alprazolam, atorvastatin, bupropion, vitamin d3, escitalopram, fluticasone-salmeterol, levothyroxine, magnesium oxide, morphine, morphine, morphine, trazodone, and tizanidine.  Current Outpatient Prescriptions on File Prior to Visit  Medication Sig  . albuterol-ipratropium (COMBIVENT) 18-103 MCG/ACT inhaler Inhale 2 puffs into the lungs every 6 (six) hours as needed.  . ALPRAZolam (XANAX) 1 MG tablet Take 1 mg by mouth 4 (four) times daily as needed.   Marland Kitchen atorvastatin (LIPITOR) 40 MG tablet Take 40 mg by mouth daily.  Marland Kitchen buPROPion (WELLBUTRIN SR) 150 MG 12 hr tablet Take 150 mg by mouth 2 (two) times daily.  . Cholecalciferol (VITAMIN D3) 5000 UNITS TABS Take 1 tablet by mouth daily.  Marland Kitchen escitalopram (LEXAPRO) 10 MG tablet Take 10 mg by mouth daily.   . Fluticasone-Salmeterol (ADVAIR) 100-50 MCG/DOSE AEPB Inhale 1 puff into the lungs every 12 (twelve) hours.  Marland Kitchen levothyroxine  (SYNTHROID, LEVOTHROID) 88 MCG tablet Take 88 mcg by mouth daily.    . magnesium oxide (MAG-OX) 400 MG tablet Take 400 mg by mouth 2 (two) times daily.  . traZODone (DESYREL) 150 MG tablet Take 150 mg by mouth at bedtime.     No current facility-administered medications on file prior to visit.    ROS  Constitutional: Denies any fever or chills Gastrointestinal: No reported hemesis, hematochezia, vomiting, or acute GI distress Musculoskeletal: Denies any acute onset joint swelling, redness, loss of ROM, or weakness Neurological: No reported episodes of acute onset apraxia, aphasia, dysarthria, agnosia, amnesia, paralysis, loss of coordination, or loss of consciousness  Allergies  [name] has No Known Allergies.  [place]  Medical:  [name]  has a past medical history of Hyperthyroidism; Dyslipidemia; Esophageal spasm; History of peptic ulcer disease; Depression; Pelvic fracture ([place]) (2008); Hiatal hernia; Carpal tunnel syndrome; Rotator cuff injury; Complication of anesthesia; COPD (chronic obstructive pulmonary disease) ([place]); Dysrhythmia; Gastrointestinal parasites; Hypothyroidism; Anxiety; CAD (coronary artery disease); Reflux; Low magnesium levels (09/14/2015); CRP elevated (09/14/2015); and Elevated sedimentation rate (09/14/2015). Family: family history is not on file. Surgical:  has past surgical history that includes Cesarean section; Neck surgery; Cholecystectomy; Carpal tunnel release; Rotator cuff repair; Shoulder arthroscopy (12/07/2011); and Shoulder surgery (12/07/2011). Tobacco:  reports that she has been smoking Cigarettes.  She has been smoking about 0.50 packs per day. She does not have any smokeless tobacco history  on file. Alcohol:  reports that she does not drink alcohol. Drug:  reports that she does not use illicit drugs.  Constitutional Exam  Vitals: Blood pressure 156/80, pulse 90, temperature 98.2 F (36.8 C), temperature source Oral, resp. rate 18, height 5\' 4"  (1.626  m), weight 115 lb (52.164 kg), SpO2 100 %. General appearance: Well nourished, well developed, and well hydrated. In no acute distress Calculated BMI/Body habitus: Body mass index is 19.73 kg/(m^2). (18.5-24.9 kg/m2) Ideal body weight Psych/Mental status: Alert and oriented x 3 (person, place, & time) Eyes: PERLA Respiratory: No evidence of acute respiratory distress  Cervical Spine Exam  Inspection: No masses, redness, or swelling Alignment: Symmetrical ROM: Functional: ROM is within functional limits [facility]) Stability: No instability detected Muscle strength & Tone: Functionally intact Sensory: Unimpaired Palpation: No complaints of tenderness  Upper Extremity (UE) Exam    Side: Right upper extremity  Side: Left upper extremity  Inspection: No masses, redness, swelling, or asymmetry  Inspection: No masses, redness, swelling, or asymmetry  ROM:  ROM:  Functional: ROM is within functional limits [facility])  Functional: ROM is within functional limits [facility])  Muscle strength & Tone: Functionally intact  Muscle strength & Tone: Functionally intact  Sensory: Unimpaired  Sensory: Unimpaired  Palpation: No complaints of tenderness  Palpation: No complaints of tenderness   Thoracic Spine Exam  Inspection: No masses, redness, or swelling Alignment: Symmetrical ROM: Functional: ROM is within functional limits [facility]) Stability: No instability detected Sensory: Unimpaired Muscle strength & Tone: Functionally intact Palpation: No complaints of tenderness  Lumbar Spine Exam  Inspection: No masses, redness, or swelling Alignment: Symmetrical ROM: Functional: ROM is within functional limits [facility]) Stability: No instability detected Muscle strength & Tone: Functionally intact Sensory: Unimpaired Palpation: No complaints of tenderness Provocative Tests: Lumbar Hyperextension and rotation test: deferred Patrick's Maneuver: deferred  Gait & Posture Assessment  Ambulation: Unassisted Gait:  Unaffected Posture: WNL  Lower Extremity Exam    Side: Right lower extremity  Side: Left lower extremity  Inspection: No masses, redness, swelling, or asymmetry ROM:  Inspection: No masses, redness, swelling, or asymmetry ROM:  Functional: ROM is within functional limits [facility])  Functional: ROM is within functional limits [facility])  Muscle strength & Tone: Functionally intact  Muscle strength & Tone: Functionally intact  Sensory: Unimpaired  Sensory: Unimpaired  Palpation: No complaints of tenderness  Palpation: No complaints of tenderness   Assessment & Plan  Primary Diagnosis & Pertinent Problem List: The primary encounter diagnosis was Chronic pain. 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), Lumbar facet hypertrophy (Bilateral), Cervical facet syndrome (Location of Secondary source of pain) (Bilateral) (R>L), Cervical spondylosis, Chronic neck pain (Location of Secondary source of pain) (Bilateral) (R>L), Chronic shoulder pain (Bilateral) (status post multiple surgeries) (R>L), Lumbar facet syndrome (Location of Primary Source of Pain) (Bilateral) (R>L), Lumbar grade 1 Anterolisthesis of L3 over L4, Chronic shoulder pain (Right), 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  Visit Diagnosis: 1. Chronic pain   2. Long term current use of opiate analgesic   3. Encounter for therapeutic drug level monitoring   4. Chronic low back pain (Location of Primary Source of  Pain) (Bilateral) (R>L)   5. Myofascial pain syndrome, cervical   6.  Cervical facet hypertrophy (Bilateral)   7. History of shoulder surgery 5 (Right)   8. Lumbar foraminal stenosis (L3-4) (Left)   9. 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 Lumbar facet hypertrophy (Bilateral)   11. Cervical facet syndrome (Location of Secondary source of pain) (Bilateral) (R>L)   12. Cervical spondylosis   13. Chronic neck pain (Location of Secondary source of pain) (Bilateral) (R>L)   14. Chronic shoulder pain (Bilateral) (status post multiple surgeries) (R>L)   15. Lumbar facet syndrome (Location of Primary Source of Pain) (Bilateral) (R>L)   16. Lumbar grade 1 Anterolisthesis of L3 over L4   17. Chronic shoulder pain (Right)   18. 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   Chronic shoulder pain (Right)   MRI arthrogram done on 11/08/2011 demonstrated extensive articular surface partial thickness tear of the distal infraspinatus tendon involving the  distal 3 cm of the tendon. One focal area of the tear extended 75% of the weight through the infraspinatus but without a full-thickness tear. There is also a small rim rent tear at the attachment of the infraspinatus and supraspinatus tendon on the greater tuberosity. Slight irregularity of the articular surface of the distal supraspinatus tendon is observed. Partial-thickness undersurface tear of the suprascapular areas tendon. The more proximal attachment of the humerus is torn. The long head of the biceps tendon is involved from his origin. The bicipital groove is empty. 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 Previous resection of the distal clavicle and anterior aspect of the acromion.     Problem-specific Plan(s): No problem-specific assessment & plan notes found for this encounter.  No new assessment & plan notes have been filed under this hospital service since the last note was generated. Service: Pain Management   Plan of Care   Problem List Items Addressed This Visit      High   Cervical facet hypertrophy (Bilateral) (Chronic)   Relevant Orders   CERVICAL FACET (MEDIAL BRANCH NERVE BLOCK)    Cervical facet syndrome (Location of Secondary source of pain) (Bilateral) (R>L) (Chronic)   Relevant Orders   CERVICAL FACET (MEDIAL BRANCH NERVE BLOCK)    Cervical myofascial pain syndrome (Chronic)   Relevant Medications   tizanidine (ZANAFLEX) 2 MG capsule   Cervical spondylosis (Chronic)   Relevant Medications   morphine (MS CONTIN) 30 MG 12 hr tablet   morphine (MS CONTIN) 30 MG 12 hr tablet   morphine (MS CONTIN) 30 MG 12 hr tablet  tizanidine (ZANAFLEX) 2 MG capsule   Other Relevant Orders   CERVICAL FACET (MEDIAL BRANCH NERVE BLOCK)    CERVICAL EPIDURAL STEROID INJECTION   Chronic low back pain (Location of Primary Source of Pain) (Bilateral) (R>L) (Chronic)   Relevant Medications   morphine (MS CONTIN) 30 MG 12 hr tablet   morphine (MS CONTIN)  30 MG 12 hr tablet   morphine (MS CONTIN) 30 MG 12 hr tablet   tizanidine (ZANAFLEX) 2 MG capsule   Other Relevant Orders   LUMBAR FACET(MEDIAL BRANCH NERVE BLOCK) MBNB   Chronic neck pain (Location of Secondary source of pain) (Bilateral) (R>L) (Chronic)   Relevant Medications   morphine (MS CONTIN) 30 MG 12 hr tablet   morphine (MS CONTIN) 30 MG 12 hr tablet   morphine (MS CONTIN) 30 MG 12 hr tablet   tizanidine (ZANAFLEX) 2 MG capsule   Other Relevant Orders   CERVICAL FACET (MEDIAL BRANCH NERVE BLOCK)    CERVICAL EPIDURAL STEROID INJECTION   Chronic pain - Primary (Chronic)   Relevant Medications   morphine (MS CONTIN) 30 MG 12 hr tablet   morphine (MS CONTIN) 30 MG 12 hr tablet   morphine (MS CONTIN) 30 MG 12 hr tablet   tizanidine (ZANAFLEX) 2 MG capsule   Other Relevant Orders   Vitamin B12   25-Hydroxyvitamin D Lcms D2+D3   Chronic shoulder pain (Bilateral) (status post multiple surgeries) (R>L) (Chronic)   Relevant Orders   SHOULDER INJECTION   SUPRASCAPULAR NERVE BLOCK   Chronic shoulder pain (Right) (Chronic)   Relevant Orders   SHOULDER INJECTION   SUPRASCAPULAR NERVE BLOCK   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) (Chronic)   Relevant Orders   CERVICAL EPIDURAL STEROID INJECTION   History of shoulder surgery 5 (Right) (Chronic)   Relevant Orders   SHOULDER INJECTION   SUPRASCAPULAR NERVE BLOCK   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) (Chronic)   Relevant Orders   LUMBAR EPIDURAL STEROID INJECTION   Lumbar facet hypertrophy (Bilateral) (Chronic)   Relevant Orders   LUMBAR FACET(MEDIAL BRANCH NERVE BLOCK) MBNB   Lumbar facet syndrome (Location of Primary Source of Pain) (Bilateral) (R>L) (Chronic)   Relevant Medications   morphine (MS CONTIN) 30 MG 12 hr tablet   morphine (MS CONTIN) 30 MG 12 hr tablet   morphine (MS CONTIN) 30 MG 12 hr tablet   tizanidine (ZANAFLEX) 2 MG capsule   Other Relevant Orders   LUMBAR  FACET(MEDIAL BRANCH NERVE BLOCK) MBNB   Lumbar foraminal stenosis (L3-4) (Left) (Chronic)   Relevant Orders   Lumbar Transforaminal epidural without steroid   Lumbar grade 1 Anterolisthesis of L3 over L4 (Chronic)   Relevant Orders   LUMBAR FACET(MEDIAL BRANCH NERVE BLOCK) MBNB   Lumbar spondylosis (Chronic)   Relevant Medications   morphine (MS CONTIN) 30 MG 12 hr tablet   morphine (MS CONTIN) 30 MG 12 hr tablet   morphine (MS CONTIN) 30 MG 12 hr tablet   tizanidine (ZANAFLEX) 2 MG capsule   Other Relevant Orders   LUMBAR FACET(MEDIAL BRANCH NERVE BLOCK) MBNB   Lumbar Transforaminal epidural without steroid   LUMBAR EPIDURAL STEROID INJECTION     Medium   Encounter for therapeutic drug level monitoring   Long term current use of opiate analgesic (Chronic)   Relevant Orders   ToxASSURE Select 13 (MW), Urine       Pharmacotherapy (Medications Ordered): Meds ordered this encounter  Medications  . morphine (MS CONTIN) 30  MG 12 hr tablet    Sig: Take 1 tablet (30 mg total) by mouth every 12 (twelve) hours.    Dispense:  60 tablet    Refill:  0    Do not place this medication, or any other prescription from our practice, on "Automatic Refill". Patient may have prescription filled one day early if pharmacy is closed on scheduled refill date. Do not fill until: 06/02/16 To last until: 07/02/16  . morphine (MS CONTIN) 30 MG 12 hr tablet    Sig: Take 1 tablet (30 mg total) by mouth every 12 (twelve) hours.    Dispense:  60 tablet    Refill:  0    Do not place this medication, or any other prescription from our practice, on "Automatic Refill". Patient may have prescription filled one day early if pharmacy is closed on scheduled refill date. Do not fill until: 07/02/16 To last until: 08/01/16  . morphine (MS CONTIN) 30 MG 12 hr tablet    Sig: Take 1 tablet (30 mg total) by mouth every 12 (twelve) hours.    Dispense:  60 tablet    Refill:  0    Do not place this medication, or any  other prescription from our practice, on "Automatic Refill". Patient may have prescription filled one day early if pharmacy is closed on scheduled refill date. Do not fill until: 08/01/16 To last until: 08/31/16  . tizanidine (ZANAFLEX) 2 MG capsule    Sig: Take 1 capsule (2 mg total) by mouth 3 (three) times daily as needed for muscle spasms.    Dispense:  90 capsule    Refill:  2    Do not place this medication, or any other prescription from our practice, on "Automatic Refill". Patient may have prescription filled one day early if pharmacy is closed on scheduled refill date.    Lab-work & Procedure Ordered: Orders Placed This Encounter  Procedures  . CERVICAL FACET (MEDIAL BRANCH NERVE BLOCK)   . CERVICAL EPIDURAL STEROID INJECTION  . SHOULDER INJECTION  . SUPRASCAPULAR NERVE BLOCK  . LUMBAR FACET(MEDIAL BRANCH NERVE BLOCK) MBNB  . Lumbar Transforaminal epidural without steroid  . LUMBAR EPIDURAL STEROID INJECTION  . ToxASSURE Select 13 (MW), Urine  . Vitamin B12  . 25-Hydroxyvitamin D Lcms D2+D3    Imaging Ordered: None  Interventional Therapies: Scheduled:  None at this time    Considering:  1. Diagnostic bilateral cervical facet block under fluoroscopic guidance and IV sedation. 2. Possible bilateral cervical facet radiofrequency ablation under fluoroscopic guidance and IV sedation. 3. Diagnostic right-sided cervical epidural steroid injection under fluoroscopic guidance, with or without sedation. 4. Diagnostic bilateral intra-articular shoulder joint injection under fluoroscopic guidance, with or without sedation. 5. Diagnostic bilateral suprascapular nerve block under fluoroscopic guidance, with or without sedation. 6. Possible bilateral suprascapular nerve radiofrequency ablation under fluoroscopic guidance and IV sedation. 7. Diagnostic bilateral lumbar facet block under fluoroscopic guidance and IV sedation.  8. Possible bilateral lumbar facet radiofrequency ablation  under fluoroscopic guidance and IV sedation.  9. Diagnostic left-sided L3-4 transforaminal epidural steroid injection fluoroscopic guidance, with a without sedation.  10. Diagnostic L3-4 versus L4-5 lumbar epidural steroid injection under fluoroscopic guidance, with a without sedation.    PRN Procedures:   1. Diagnostic bilateral cervical facet block under fluoroscopic guidance and IV sedation. 2. Diagnostic right-sided cervical epidural steroid injection under fluoroscopic guidance, with or without sedation. 3. Diagnostic bilateral intra-articular shoulder joint injection under fluoroscopic guidance, with or without sedation. 4. Diagnostic bilateral suprascapular  nerve block under fluoroscopic guidance, with or without sedation. 5. Diagnostic bilateral lumbar facet block under fluoroscopic guidance and IV sedation.  6. Diagnostic left-sided L3-4 transforaminal epidural steroid injection fluoroscopic guidance, with a without sedation.  7. Diagnostic L3-4 versus L4-5 lumbar epidural steroid injection under fluoroscopic guidance, with a without sedation.    Referral(s) or Consult(s): None at this time.  New Prescriptions   TIZANIDINE (ZANAFLEX) 2 MG CAPSULE    Take 1 capsule (2 mg total) by mouth 3 (three) times daily as needed for muscle spasms.    Medications administered during this visit: [name] had no medications administered during this visit.  Requested PM Follow-up: Return in about 3 months (around 08/21/2016) for Medication Management, (3-Mo), Procedure (PRN - Patient will call).  Future Appointments Date Time Provider [place]  08/16/2016 1:00 PM Milinda Pointer, MD [facility] None    Primary Care Physician: Albina Billet, MD Location: [facility] Note by: Kathlen Brunswick. Dossie Arbour, M.D, DABA, DABAPM, DABPM, DABIPP, FIPP  Pain Score Disclaimer: We use the NRS-11 scale. This is a self-reported, subjective measurement of pain severity with only  modest accuracy. It is used primarily to identify changes within a particular patient. It must be understood that outpatient pain scales are significantly less accurate that those used for research, where they can be applied under ideal controlled circumstances with minimal exposure to variables. In reality, the score is likely to be a combination of pain intensity and pain affect, where pain affect describes the degree of emotional arousal or changes in action readiness caused by the sensory experience of pain. Factors such as social and work situation, setting, emotional state, anxiety levels, expectation, and prior pain experience may influence pain perception and show large inter-individual differences that may also be affected by time variables.  Patient instructions provided during this appointment: Patient Instructions  Instructed to get labwork drawn in the medical mall.  Information on pain scale given.

## 2016-05-18 NOTE — Telephone Encounter (Signed)
Pt says Dr Lowella Dandy was suppose to send cough meds to pharmacy pt pharmacy says they have not gotten anything

## 2016-05-18 NOTE — Patient Instructions (Signed)
Instructed to get labwork drawn in the medical mall.  Information on pain scale given.

## 2016-05-18 NOTE — Telephone Encounter (Signed)
Attempted to call patient, no answer 

## 2016-05-18 NOTE — Progress Notes (Signed)
Safety precautions to be maintained throughout the outpatient stay will include: orient to surroundings, keep bed in low position, maintain call bell within reach at all times, provide assistance with transfer out of bed and ambulation.  Did not bring medication bottles to this appointment.

## 2016-05-19 ENCOUNTER — Telehealth: Payer: Self-pay | Admitting: *Deleted

## 2016-05-19 NOTE — Telephone Encounter (Signed)
Attempted to call patient, no answer 

## 2016-05-27 DIAGNOSIS — M47816 Spondylosis without myelopathy or radiculopathy, lumbar region: Secondary | ICD-10-CM | POA: Insufficient documentation

## 2016-05-27 DIAGNOSIS — M48061 Spinal stenosis, lumbar region without neurogenic claudication: Secondary | ICD-10-CM | POA: Insufficient documentation

## 2016-05-27 DIAGNOSIS — M47812 Spondylosis without myelopathy or radiculopathy, cervical region: Secondary | ICD-10-CM | POA: Insufficient documentation

## 2016-05-27 DIAGNOSIS — M4310 Spondylolisthesis, site unspecified: Secondary | ICD-10-CM | POA: Insufficient documentation

## 2016-05-27 DIAGNOSIS — Z9889 Other specified postprocedural states: Secondary | ICD-10-CM | POA: Insufficient documentation

## 2016-05-28 LAB — TOXASSURE SELECT 13 (MW), URINE: PDF: 0

## 2016-05-29 ENCOUNTER — Encounter: Payer: Medicare HMO | Admitting: Pain Medicine

## 2016-07-13 ENCOUNTER — Telehealth: Payer: Self-pay

## 2016-07-13 NOTE — Telephone Encounter (Signed)
Pt is saying she is not sure if she got all of her scripts when she was here at her appt she seemed a little confused. Pt wants to speak with a nurse

## 2016-07-14 NOTE — Telephone Encounter (Signed)
Patient states she found her prescription.

## 2016-07-27 DIAGNOSIS — Z0120 Encounter for dental examination and cleaning without abnormal findings: Secondary | ICD-10-CM | POA: Diagnosis not present

## 2016-08-16 ENCOUNTER — Ambulatory Visit: Payer: Medicare HMO | Attending: Pain Medicine | Admitting: Pain Medicine

## 2016-08-16 ENCOUNTER — Encounter: Payer: Self-pay | Admitting: Pain Medicine

## 2016-08-16 VITALS — BP 104/79 | HR 118 | Temp 98.0°F | Resp 16 | Ht 64.0 in | Wt 115.0 lb

## 2016-08-16 DIAGNOSIS — Z79891 Long term (current) use of opiate analgesic: Secondary | ICD-10-CM | POA: Insufficient documentation

## 2016-08-16 DIAGNOSIS — Z79899 Other long term (current) drug therapy: Secondary | ICD-10-CM | POA: Diagnosis not present

## 2016-08-16 DIAGNOSIS — H9203 Otalgia, bilateral: Secondary | ICD-10-CM | POA: Insufficient documentation

## 2016-08-16 DIAGNOSIS — F329 Major depressive disorder, single episode, unspecified: Secondary | ICD-10-CM | POA: Diagnosis not present

## 2016-08-16 DIAGNOSIS — M791 Myalgia: Secondary | ICD-10-CM | POA: Diagnosis not present

## 2016-08-16 DIAGNOSIS — E782 Mixed hyperlipidemia: Secondary | ICD-10-CM | POA: Diagnosis not present

## 2016-08-16 DIAGNOSIS — F419 Anxiety disorder, unspecified: Secondary | ICD-10-CM | POA: Diagnosis not present

## 2016-08-16 DIAGNOSIS — R102 Pelvic and perineal pain: Secondary | ICD-10-CM | POA: Insufficient documentation

## 2016-08-16 DIAGNOSIS — F17210 Nicotine dependence, cigarettes, uncomplicated: Secondary | ICD-10-CM | POA: Diagnosis not present

## 2016-08-16 DIAGNOSIS — M5382 Other specified dorsopathies, cervical region: Secondary | ICD-10-CM

## 2016-08-16 DIAGNOSIS — E039 Hypothyroidism, unspecified: Secondary | ICD-10-CM | POA: Insufficient documentation

## 2016-08-16 DIAGNOSIS — M545 Low back pain, unspecified: Secondary | ICD-10-CM

## 2016-08-16 DIAGNOSIS — M47812 Spondylosis without myelopathy or radiculopathy, cervical region: Secondary | ICD-10-CM

## 2016-08-16 DIAGNOSIS — M542 Cervicalgia: Secondary | ICD-10-CM | POA: Diagnosis not present

## 2016-08-16 DIAGNOSIS — M25511 Pain in right shoulder: Secondary | ICD-10-CM | POA: Insufficient documentation

## 2016-08-16 DIAGNOSIS — Z5181 Encounter for therapeutic drug level monitoring: Secondary | ICD-10-CM | POA: Insufficient documentation

## 2016-08-16 DIAGNOSIS — F1190 Opioid use, unspecified, uncomplicated: Secondary | ICD-10-CM

## 2016-08-16 DIAGNOSIS — M25512 Pain in left shoulder: Secondary | ICD-10-CM | POA: Insufficient documentation

## 2016-08-16 DIAGNOSIS — G8929 Other chronic pain: Secondary | ICD-10-CM | POA: Insufficient documentation

## 2016-08-16 DIAGNOSIS — R69 Illness, unspecified: Secondary | ICD-10-CM | POA: Diagnosis not present

## 2016-08-16 DIAGNOSIS — M7918 Myalgia, other site: Secondary | ICD-10-CM

## 2016-08-16 DIAGNOSIS — M47816 Spondylosis without myelopathy or radiculopathy, lumbar region: Secondary | ICD-10-CM

## 2016-08-16 MED ORDER — MORPHINE SULFATE ER 30 MG PO TBCR: 30.0000 mg | EXTENDED_RELEASE_TABLET | Freq: Two times a day (BID) | ORAL | 0 refills | Status: DC

## 2016-08-16 MED ORDER — TIZANIDINE HCL 2 MG PO CAPS: 2.0000 mg | ORAL_CAPSULE | Freq: Three times a day (TID) | ORAL | 2 refills | Status: DC | PRN

## 2016-08-16 NOTE — Progress Notes (Signed)
Patient here today for medication management.  Patient is going through divorce which she says has increased her anxiety but seems to be under control with medication.   Patient does not have pill bottle to count,  Instructed that she needs to bring medication to each visit for counting.   Safety precautions to be maintained throughout the outpatient stay will include: orient to surroundings, keep bed in low position, maintain call bell within reach at all times, provide assistance with transfer out of bed and ambulation.

## 2016-08-16 NOTE — Progress Notes (Signed)
Patient's Name: [name]  MRN: [id number]  Referring Provider: Albina Billet, MD  DOB: 13-Aug-1946  PCP: Albina Billet, MD  DOS: 08/16/2016  Note by: Kathlen Brunswick. Dossie Arbour, MD  Service setting: Ambulatory outpatient  Specialty: Interventional Pain Management  Location: [facility] (AMB) Pain Management Facility    Patient type: Established   Primary Reason(s) for Visit: Encounter for prescription drug management (Level of risk: moderate) CC: Neck Pain; Shoulder Pain (bilateral); Back Pain; Pelvic Pain; and Ear Pain (partial broke and bite is off and is causing referred pain into both ears. )  HPI  [name] is a 70 y.o. year old, female patient, who comes today for an initial evaluation. 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); Cervical facet syndrome (Location of Secondary source of pain) (Bilateral) (R>L); Cervical myofascial pain syndrome; Chronic low back pain (Location of Primary Source of Pain) (Bilateral) (R>L); Lumbar spondylosis; Chronic shoulder impingement syndrome (Right); Low magnesium levels; CRP elevated; Elevated sedimentation rate; Chronic obstructive pulmonary disease (COPD) ([place]); Nicotine dependence; Chronic shoulder pain (Right); Abnormal nerve conduction studies; Chronic shoulder pain (Bilateral) (status post multiple surgeries) (R>L); Cervical facet hypertrophy (Bilateral); History of shoulder surgery 5 (Right); Lumbar foraminal stenosis (L3-4) (Left); 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 Her primarily concern today is the Neck Pain; Shoulder Pain (bilateral); Back Pain; Pelvic Pain; and Ear Pain (partial broke and bite is off and is causing referred pain into both ears. )  Pain Assessment: Self-Reported Pain Score: 6  (pain score reviewed)/10 Clinically the patient looks like a 2/10 Reported level is inconsistent with clinical observations. Information on the proper use of the pain score provided to the patient today. Pain Type: Chronic pain Pain Location: Neck Pain Orientation:  (patient states that she has a plate in her neck and has very limited ROM) Pain Descriptors / Indicators: Sharp (pressure that builds and is sharp) Pain Frequency: Constant  The patient comes into the clinics today for pharmacological management of her chronic pain. I last saw this patient on 05/18/2016. The patient  reports that she does not use drugs. Her body mass index is 19.74 kg/m.  Date of Last Visit: 05/18/16 Service Provided on Last Visit: Med Refill  Controlled Substance Pharmacotherapy Assessment & REMS (Risk Evaluation and Mitigation Strategy)  Analgesic: Morphine ER 30 mg every 12 hours (60 mg per day) MME/day: 60 mg/day.  Pill Count: Patient does not have pill bottle to count,  Instructed that she needs to bring medication to each visit for counting. Final warning provided to the patient today. The patient was warned that if she does not bring her pills to the next appointment, we will not be refilling the medication. Pharmacokinetics: Onset of action (Liberation/Absorption): Within expected pharmacological parameters Time to Peak effect (Distribution): Timing and results are as within normal expected parameters Duration of action (Metabolism/Excretion): Within normal limits for  medication Pharmacodynamics: Analgesic Effect: More than 50% Activity Facilitation: Medication(s) allow patient to sit, stand, walk, and do  the basic ADLs Perceived Effectiveness: Described as relatively effective, allowing for increase in activities of daily living (ADL) Side-effects or Adverse reactions: None reported Monitoring: [place] PMP: Online review of the past 34-month period conducted. Compliant with practice rules and regulations List of all UDS test(s) done:  Lab Results  Component Value Date   TOXASSSELUR FINAL 05/18/2016   TOXASSSELUR FINAL 02/28/2016   [place] FINAL 12/08/2015   Last UDS on record: ToxAssure Select 13  Date Value Ref Range Status  05/18/2016 FINAL  Final    Comment:    ==================================================================== TOXASSURE SELECT 13 (MW) ==================================================================== Test                             Result       Flag       Units Drug Present and Declared for Prescription Verification   Alprazolam                     318          EXPECTED   ng/mg creat   Alpha-hydroxyalprazolam        1286         EXPECTED   ng/mg creat    Source of alprazolam is a scheduled prescription medication.    Alpha-hydroxyalprazolam is an expected metabolite of alprazolam.   Morphine                       4538         EXPECTED   ng/mg creat    Potential sources of large amounts of morphine in the absence of    codeine include administration of morphine or use of heroin. Drug Present not Declared for Prescription Verification   Hydromorphone                  244          UNEXPECTED ng/mg creat    Hydromorphone may be administered as a scheduled prescription    medication and is also an expected metabolite of hydrocodone.    Hydromorphone is also a minor metabolite of morphine which is    also present in this specimen. Concentrations of hydromorphone    rarely exceed 5% of the morphine concentration when  metabolism of    morphine is the sole source of hydromorphone. ==================================================================== Test                      Result    Flag   Units      Ref Range   Creatinine              192              mg/dL      >=20 ==================================================================== Declared Medications:  The flagging and interpretation on this report are based on the  following declared medications.  Unexpected results may arise from  inaccuracies in the declared medications.  **Note: The testing scope of this panel includes these medications:  Alprazolam (Xanax)  Morphine (MS Contin)  **Note: The testing scope of this panel does not include following  reported medications:  Albuterol (Combivent)  Atorvastatin (Lipitor)  Bupropion (Wellbutrin)  Citalopram (Lexapro)  Fluticasone (Advair)  Ipratropium (Combivent)  Levothyroxine  Magnesium (Mag-Ox)  Salmeterol (Advair)  Trazodone (Desyrel)  Vitamin D3 ==================================================================== For clinical consultation, please call [phone]. ====================================================================    UDS interpretation: Compliant  Medication Assessment Form: Reviewed. Patient indicates being compliant with therapy Treatment compliance: Compliant Risk Assessment: Aberrant Behavior: None observed today Substance Use Disorder (SUD) Risk Level: No change since last visit Risk of opioid abuse or dependence: 0.7-3.0% with doses ? 36 MME/day and 6.1-26% with doses ? 120 MME/day. Opioid Risk Tool (ORT) Score: 4   Moderate Risk for SUD (Score between 4-7) Depression Scale Score: PHQ-2: 0   No depression (0) PHQ-9: 0   No depression (0-4)  Pharmacologic Plan: No change in therapy, at this time  Laboratory Chemistry  Inflammation Markers Lab Results  Component Value Date   ESRSEDRATE 28 12/08/2015   CRP <0.5 12/08/2015   Renal  Function Lab Results  Component Value Date   BUN 11 12/08/2015   CREATININE 0.78 12/08/2015   GFRAA >60 12/08/2015   GFRNONAA >60 12/08/2015   Hepatic Function Lab Results  Component Value Date   AST 28 12/08/2015   ALT 25 12/08/2015   ALBUMIN 3.8 12/08/2015   Electrolytes Lab Results  Component Value Date   NA 136 12/08/2015   K 4.0 12/08/2015   CL 101 12/08/2015   CALCIUM 9.2 12/08/2015   MG 1.7 12/08/2015   Pain Modulating Vitamins No results found for: [place], [place], [id number], [id number], 25OHVITD1, 25OHVITD2, 25OHVITD3, VITAMINB12 Coagulation Parameters No results found for: INR, LABPROT, APTT, PLT Cardiovascular Lab Results  Component Value Date   HGB 17.7 (H) 12/07/2011   HCT 52.0 (H) 12/07/2011    Note: Lab results reviewed.  Recent Diagnostic Imaging  No results found. Meds  The patient has a current medication list which includes the following prescription(s): albuterol-ipratropium, alprazolam, atorvastatin, bupropion, vitamin d3, escitalopram, fluticasone-salmeterol, magnesium oxide, morphine, morphine, morphine, tizanidine, trazodone, and levothyroxine.  Current Outpatient Prescriptions on File Prior to Visit  Medication Sig  . albuterol-ipratropium (COMBIVENT) 18-103 MCG/ACT inhaler Inhale 2 puffs into the lungs every 6 (six) hours as needed.  . ALPRAZolam (XANAX) 1 MG tablet Take 1 mg by mouth 4 (four) times daily as needed.   Marland Kitchen atorvastatin (LIPITOR) 40 MG tablet Take 40 mg by mouth daily.  Marland Kitchen buPROPion (WELLBUTRIN SR) 150 MG 12 hr tablet Take 150 mg by mouth 2 (two) times daily.  . Cholecalciferol (VITAMIN D3) 5000 UNITS TABS Take 1 tablet by mouth daily.  Marland Kitchen escitalopram (LEXAPRO) 10 MG tablet Take 10 mg by mouth daily.   . Fluticasone-Salmeterol (ADVAIR) 100-50 MCG/DOSE AEPB Inhale 1 puff into the lungs every 12 (twelve) hours.  . magnesium oxide (MAG-OX) 400 MG tablet Take 400 mg by mouth 2 (two) times daily.  . traZODone (DESYREL) 150 MG  tablet Take 150 mg by mouth at bedtime.    Marland Kitchen levothyroxine (SYNTHROID, LEVOTHROID) 88 MCG tablet Take 88 mcg by mouth daily.     No current facility-administered medications on file prior to visit.    ROS  Constitutional: Denies any fever or chills Gastrointestinal: No reported hemesis, hematochezia, vomiting, or acute GI distress Musculoskeletal: Denies any acute onset joint swelling, redness, loss of ROM, or weakness Neurological: No reported episodes of acute onset apraxia, aphasia, dysarthria, agnosia, amnesia, paralysis, loss of coordination, or loss of consciousness  Allergies  [name] has No Known Allergies.  [place]  Medical:  [name]  has a past medical history of Anxiety; CAD (coronary artery disease); Carpal tunnel syndrome; Complication of anesthesia; COPD (chronic obstructive pulmonary disease) ([place]); CRP elevated (09/14/2015); Depression; Dyslipidemia; Dysrhythmia; Elevated sedimentation rate (09/14/2015); Esophageal spasm; Gastrointestinal parasites; Hiatal hernia; History of peptic ulcer disease; Hyperthyroidism;  Hypothyroidism; Low magnesium levels (09/14/2015); Pelvic fracture ([place]) (2008); Reflux; and Rotator cuff injury. Family: family history is not on file. Surgical:  has a past surgical history that includes Cesarean section; Neck surgery; Cholecystectomy; Carpal tunnel release; Rotator cuff repair; Shoulder arthroscopy (12/07/2011); and Shoulder surgery (12/07/2011). Tobacco:  reports that she has been smoking Cigarettes.  She has been smoking about 0.50 packs per day. She has never used smokeless tobacco. Alcohol:  reports that she does not drink alcohol. Drug:  reports that she does not use drugs.  Constitutional Exam  General appearance: Well nourished, well developed, and well hydrated. In no acute distress Vitals:   08/16/16 1255  BP: 104/79  Pulse: (!) 118  Resp: 16  Temp: 98 F (36.7 C)  TempSrc: Oral  SpO2: 96%  Weight: 115 lb (52.2 kg)  Height: 5\' 4"   (1.626 m)  BMI Assessment: Estimated body mass index is 19.74 kg/m as calculated from the following:   Height as of this encounter: 5\' 4"  (1.626 m).   Weight as of this encounter: 115 lb (52.2 kg).   BMI interpretation: (18.5-24.9 kg/m2) = Ideal body weight BMI Readings from Last 4 Encounters:  08/16/16 19.74 kg/m  05/18/16 19.74 kg/m  02/28/16 19.91 kg/m  12/08/15 20.60 kg/m   Wt Readings from Last 4 Encounters:  08/16/16 115 lb (52.2 kg)  05/18/16 115 lb (52.2 kg)  02/28/16 116 lb (52.6 kg)  12/08/15 120 lb (54.4 kg)  Psych/Mental status: Alert and oriented x 3 (person, place, & time) Eyes: PERLA Respiratory: No evidence of acute respiratory distress  Cervical Spine Exam  Inspection: No masses, redness, or swelling Alignment: Symmetrical Functional ROM: Unrestricted ROM Stability: No instability detected Muscle strength & Tone: Functionally intact Sensory: Unimpaired Palpation: Non-contributory  Upper Extremity (UE) Exam    Side: Right upper extremity  Side: Left upper extremity  Inspection: No masses, redness, swelling, or asymmetry  Inspection: No masses, redness, swelling, or asymmetry  Functional ROM: Unrestricted ROM         Functional ROM: Unrestricted ROM          Muscle strength & Tone: Functionally intact  Muscle strength & Tone: Functionally intact  Sensory: Unimpaired  Sensory: Unimpaired  Palpation: Non-contributory  Palpation: Non-contributory   Thoracic Spine Exam  Inspection: No masses, redness, or swelling Alignment: Symmetrical Functional ROM: Unrestricted ROM Stability: No instability detected Sensory: Unimpaired Muscle strength & Tone: Functionally intact Palpation: Non-contributory  Lumbar Spine Exam  Inspection: No masses, redness, or swelling Alignment: Symmetrical Functional ROM: Unrestricted ROM Stability: No instability detected Muscle strength & Tone: Functionally intact Sensory: Unimpaired Palpation: Non-contributory Provocative  Tests: Lumbar Hyperextension and rotation test: evaluation deferred today       Patrick's Maneuver: evaluation deferred today              Gait & Posture Assessment  Ambulation: Unassisted Gait: Relatively normal for age and body habitus Posture: WNL   Lower Extremity Exam    Side: Right lower extremity  Side: Left lower extremity  Inspection: No masses, redness, swelling, or asymmetry  Inspection: No masses, redness, swelling, or asymmetry  Functional ROM: Unrestricted ROM          Functional ROM: Unrestricted ROM          Muscle strength & Tone: Functionally intact  Muscle strength & Tone: Functionally intact  Sensory: Unimpaired  Sensory: Unimpaired  Palpation: Non-contributory  Palpation: Non-contributory   Assessment  Primary Diagnosis & Pertinent Problem List: The primary encounter diagnosis was  Chronic pain. Diagnoses of Long term current use of opiate analgesic, Opiate use, Chronic low back pain (Location of Primary Source of Pain) (Bilateral) (R>L), Cervical facet syndrome (Location of Secondary source of pain) (Bilateral) (R>L), Chronic neck pain (Location of Secondary source of pain) (Bilateral) (R>L), Lumbar facet syndrome (Location of Primary Source of Pain) (Bilateral) (R>L), and Myofascial pain syndrome, cervical were also pertinent to this visit.  Visit Diagnosis: 1. Chronic pain   2. Long term current use of opiate analgesic   3. Opiate use   4. Chronic low back pain (Location of Primary Source of Pain) (Bilateral) (R>L)   5. Cervical facet syndrome (Location of Secondary source of pain) (Bilateral) (R>L)   6. Chronic neck pain (Location of Secondary source of pain) (Bilateral) (R>L)   7. Lumbar facet syndrome (Location of Primary Source of Pain) (Bilateral) (R>L)   8. Myofascial pain syndrome, cervical    Plan of Care  Pharmacotherapy (Medications Ordered): Meds ordered this encounter  Medications  . morphine (MS CONTIN) 30 MG 12 hr tablet    Sig: Take 1 tablet  (30 mg total) by mouth every 12 (twelve) hours.    Dispense:  60 tablet    Refill:  0    Do not place this medication, or any other prescription from our practice, on "Automatic Refill". Patient may have prescription filled one day early if pharmacy is closed on scheduled refill date. Do not fill until: 08/31/16 To last until: 09/30/16  . morphine (MS CONTIN) 30 MG 12 hr tablet    Sig: Take 1 tablet (30 mg total) by mouth every 12 (twelve) hours.    Dispense:  60 tablet    Refill:  0    Do not place this medication, or any other prescription from our practice, on "Automatic Refill". Patient may have prescription filled one day early if pharmacy is closed on scheduled refill date. Do not fill until: 09/30/16 To last until: 10/30/16  . morphine (MS CONTIN) 30 MG 12 hr tablet    Sig: Take 1 tablet (30 mg total) by mouth every 12 (twelve) hours.    Dispense:  60 tablet    Refill:  0    Do not place this medication, or any other prescription from our practice, on "Automatic Refill". Patient may have prescription filled one day early if pharmacy is closed on scheduled refill date. Do not fill until: 10/30/16 To last until: 11/29/16  . tizanidine (ZANAFLEX) 2 MG capsule    Sig: Take 1 capsule (2 mg total) by mouth 3 (three) times daily as needed for muscle spasms.    Dispense:  90 capsule    Refill:  2    Do not place this medication, or any other prescription from our practice, on "Automatic Refill". Patient may have prescription filled one day early if pharmacy is closed on scheduled refill date.   New Prescriptions   No medications on file   Medications administered during this visit: [name] had no medications administered during this visit. Lab-work, Procedure(s), & Referral(s) Ordered: No orders of the defined types were placed in this encounter.  Imaging & Referral(s) Ordered: None  Interventional Therapies: Scheduled:  None at this time    Considering:  1. Diagnostic  bilateral cervical facet block under fluoroscopic guidance and IV sedation. 2. Possible bilateral cervical facet radiofrequency ablation under fluoroscopic guidance and IV sedation. 3. Diagnostic right-sided cervical epidural steroid injection under fluoroscopic guidance, with or without sedation. 4. Diagnostic bilateral intra-articular shoulder joint  injection under fluoroscopic guidance, with or without sedation. 5. Diagnostic bilateral suprascapular nerve block under fluoroscopic guidance, with or without sedation. 6. Possible bilateral suprascapular nerve radiofrequency ablation under fluoroscopic guidance and IV sedation. 7. Diagnostic bilateral lumbar facet block under fluoroscopic guidance and IV sedation.  8. Possible bilateral lumbar facet radiofrequency ablation under fluoroscopic guidance and IV sedation.  9. Diagnostic left-sided L3-4 transforaminal epidural steroid injection fluoroscopic guidance, with a without sedation.  10. Diagnostic L3-4 versus L4-5 lumbar epidural steroid injection under fluoroscopic guidance, with a without sedation.    PRN Procedures:   1. Diagnostic bilateral cervical facet block under fluoroscopic guidance and IV sedation. 2. Diagnostic right-sided cervical epidural steroid injection under fluoroscopic guidance, with or without sedation. 3. Diagnostic bilateral intra-articular shoulder joint injection under fluoroscopic guidance, with or without sedation. 4. Diagnostic bilateral suprascapular nerve block under fluoroscopic guidance, with or without sedation. 5. Diagnostic bilateral lumbar facet block under fluoroscopic guidance and IV sedation.  6. Diagnostic left-sided L3-4 transforaminal epidural steroid injection fluoroscopic guidance, with a without sedation.  7. Diagnostic L3-4 versus L4-5 lumbar epidural steroid injection under fluoroscopic guidance, with a without sedation.    Requested PM Follow-up: Return in 3 months (on 11/15/2016) for  Med-Mgmt.  Future Appointments Date Time Provider [place]  11/01/2016 1:20 PM Milinda Pointer, MD [facility] None   Primary Care Physician: Albina Billet, MD Location: [facility] Note by: Kathlen Brunswick. Dossie Arbour, M.D, DABA, DABAPM, DABPM, DABIPP, FIPP  Pain Score Disclaimer: We use the NRS-11 scale. This is a self-reported, subjective measurement of pain severity with only modest accuracy. It is used primarily to identify changes within a particular patient. It must be understood that outpatient pain scales are significantly less accurate that those used for research, where they can be applied under ideal controlled circumstances with minimal exposure to variables. In reality, the score is likely to be a combination of pain intensity and pain affect, where pain affect describes the degree of emotional arousal or changes in action readiness caused by the sensory experience of pain. Factors such as social and work situation, setting, emotional state, anxiety levels, expectation, and prior pain experience may influence pain perception and show large inter-individual differences that may also be affected by time variables.  Patient instructions provided during this appointment: Patient Instructions  You were given 3 prescriptions for Morphine today. One prescription for Tizanidine was sent to your pharmacy.

## 2016-08-16 NOTE — Patient Instructions (Signed)
You were given 3 prescriptions for Morphine today. One prescription for Tizanidine was sent to your pharmacy.

## 2016-09-08 DIAGNOSIS — I10 Essential (primary) hypertension: Secondary | ICD-10-CM | POA: Diagnosis not present

## 2016-09-08 DIAGNOSIS — E785 Hyperlipidemia, unspecified: Secondary | ICD-10-CM | POA: Diagnosis not present

## 2016-10-01 ENCOUNTER — Inpatient Hospital Stay: Payer: Medicare HMO | Admitting: Anesthesiology

## 2016-10-01 ENCOUNTER — Emergency Department: Payer: Medicare HMO

## 2016-10-01 ENCOUNTER — Inpatient Hospital Stay: Payer: Medicare HMO

## 2016-10-01 ENCOUNTER — Inpatient Hospital Stay
Admission: EM | Admit: 2016-10-01 | Discharge: 2016-10-04 | DRG: 480 | Disposition: A | Discharge status: Skilled Nursing Facility | Payer: Medicare HMO | Attending: Internal Medicine | Admitting: Internal Medicine

## 2016-10-01 ENCOUNTER — Encounter
Admission: EM | Disposition: A | Discharge status: Skilled Nursing Facility | Payer: Self-pay | Source: Home / Self Care | Attending: Internal Medicine

## 2016-10-01 DIAGNOSIS — R6889 Other general symptoms and signs: Secondary | ICD-10-CM | POA: Diagnosis not present

## 2016-10-01 DIAGNOSIS — E46 Unspecified protein-calorie malnutrition: Secondary | ICD-10-CM | POA: Diagnosis not present

## 2016-10-01 DIAGNOSIS — I10 Essential (primary) hypertension: Secondary | ICD-10-CM | POA: Diagnosis not present

## 2016-10-01 DIAGNOSIS — G8929 Other chronic pain: Secondary | ICD-10-CM | POA: Diagnosis present

## 2016-10-01 DIAGNOSIS — S72141A Displaced intertrochanteric fracture of right femur, initial encounter for closed fracture: Secondary | ICD-10-CM

## 2016-10-01 DIAGNOSIS — E785 Hyperlipidemia, unspecified: Secondary | ICD-10-CM | POA: Diagnosis not present

## 2016-10-01 DIAGNOSIS — E039 Hypothyroidism, unspecified: Secondary | ICD-10-CM | POA: Diagnosis present

## 2016-10-01 DIAGNOSIS — Z79891 Long term (current) use of opiate analgesic: Secondary | ICD-10-CM

## 2016-10-01 DIAGNOSIS — D72829 Elevated white blood cell count, unspecified: Secondary | ICD-10-CM

## 2016-10-01 DIAGNOSIS — G92 Toxic encephalopathy: Secondary | ICD-10-CM | POA: Diagnosis not present

## 2016-10-01 DIAGNOSIS — T424X5A Adverse effect of benzodiazepines, initial encounter: Secondary | ICD-10-CM | POA: Diagnosis present

## 2016-10-01 DIAGNOSIS — L89320 Pressure ulcer of left buttock, unstageable: Secondary | ICD-10-CM | POA: Diagnosis present

## 2016-10-01 DIAGNOSIS — Z7401 Bed confinement status: Secondary | ICD-10-CM | POA: Diagnosis not present

## 2016-10-01 DIAGNOSIS — F419 Anxiety disorder, unspecified: Secondary | ICD-10-CM | POA: Diagnosis present

## 2016-10-01 DIAGNOSIS — Z8711 Personal history of peptic ulcer disease: Secondary | ICD-10-CM

## 2016-10-01 DIAGNOSIS — M25551 Pain in right hip: Secondary | ICD-10-CM | POA: Diagnosis not present

## 2016-10-01 DIAGNOSIS — S72009A Fracture of unspecified part of neck of unspecified femur, initial encounter for closed fracture: Secondary | ICD-10-CM | POA: Diagnosis not present

## 2016-10-01 DIAGNOSIS — K219 Gastro-esophageal reflux disease without esophagitis: Secondary | ICD-10-CM | POA: Diagnosis present

## 2016-10-01 DIAGNOSIS — S0990XA Unspecified injury of head, initial encounter: Secondary | ICD-10-CM | POA: Diagnosis not present

## 2016-10-01 DIAGNOSIS — D62 Acute posthemorrhagic anemia: Secondary | ICD-10-CM | POA: Diagnosis not present

## 2016-10-01 DIAGNOSIS — L8990 Pressure ulcer of unspecified site, unspecified stage: Secondary | ICD-10-CM | POA: Insufficient documentation

## 2016-10-01 DIAGNOSIS — J449 Chronic obstructive pulmonary disease, unspecified: Secondary | ICD-10-CM | POA: Diagnosis present

## 2016-10-01 DIAGNOSIS — W1830XA Fall on same level, unspecified, initial encounter: Secondary | ICD-10-CM | POA: Diagnosis present

## 2016-10-01 DIAGNOSIS — F17200 Nicotine dependence, unspecified, uncomplicated: Secondary | ICD-10-CM | POA: Diagnosis present

## 2016-10-01 DIAGNOSIS — S72141D Displaced intertrochanteric fracture of right femur, subsequent encounter for closed fracture with routine healing: Secondary | ICD-10-CM | POA: Diagnosis not present

## 2016-10-01 DIAGNOSIS — W19XXXD Unspecified fall, subsequent encounter: Secondary | ICD-10-CM | POA: Diagnosis not present

## 2016-10-01 DIAGNOSIS — K449 Diaphragmatic hernia without obstruction or gangrene: Secondary | ICD-10-CM | POA: Diagnosis present

## 2016-10-01 DIAGNOSIS — M6281 Muscle weakness (generalized): Secondary | ICD-10-CM

## 2016-10-01 DIAGNOSIS — Y92015 Private garage of single-family (private) house as the place of occurrence of the external cause: Secondary | ICD-10-CM

## 2016-10-01 DIAGNOSIS — S199XXA Unspecified injury of neck, initial encounter: Secondary | ICD-10-CM | POA: Diagnosis not present

## 2016-10-01 DIAGNOSIS — Z681 Body mass index (BMI) 19 or less, adult: Secondary | ICD-10-CM | POA: Diagnosis not present

## 2016-10-01 DIAGNOSIS — F329 Major depressive disorder, single episode, unspecified: Secondary | ICD-10-CM | POA: Diagnosis present

## 2016-10-01 DIAGNOSIS — T402X5A Adverse effect of other opioids, initial encounter: Secondary | ICD-10-CM | POA: Diagnosis present

## 2016-10-01 DIAGNOSIS — W19XXXA Unspecified fall, initial encounter: Secondary | ICD-10-CM | POA: Diagnosis not present

## 2016-10-01 DIAGNOSIS — Z716 Tobacco abuse counseling: Secondary | ICD-10-CM | POA: Diagnosis not present

## 2016-10-01 DIAGNOSIS — S72001A Fracture of unspecified part of neck of right femur, initial encounter for closed fracture: Secondary | ICD-10-CM

## 2016-10-01 DIAGNOSIS — I2510 Atherosclerotic heart disease of native coronary artery without angina pectoris: Secondary | ICD-10-CM | POA: Diagnosis present

## 2016-10-01 DIAGNOSIS — K5900 Constipation, unspecified: Secondary | ICD-10-CM | POA: Diagnosis present

## 2016-10-01 DIAGNOSIS — Z01818 Encounter for other preprocedural examination: Secondary | ICD-10-CM | POA: Diagnosis not present

## 2016-10-01 DIAGNOSIS — E876 Hypokalemia: Secondary | ICD-10-CM | POA: Diagnosis not present

## 2016-10-01 DIAGNOSIS — R69 Illness, unspecified: Secondary | ICD-10-CM | POA: Diagnosis not present

## 2016-10-01 HISTORY — DX: Failed or difficult intubation, initial encounter: T88.4XXA

## 2016-10-01 HISTORY — PX: INTRAMEDULLARY (IM) NAIL INTERTROCHANTERIC: SHX5875

## 2016-10-01 LAB — COMPREHENSIVE METABOLIC PANEL
ALK PHOS: 67 U/L (ref 38–126)
ALT: 25 U/L (ref 14–54)
ANION GAP: 8 (ref 5–15)
AST: 30 U/L (ref 15–41)
Albumin: 3.4 g/dL — ABNORMAL LOW (ref 3.5–5.0)
BUN: 18 mg/dL (ref 6–20)
CALCIUM: 8.7 mg/dL — AB (ref 8.9–10.3)
CHLORIDE: 100 mmol/L — AB (ref 101–111)
CO2: 27 mmol/L (ref 22–32)
CREATININE: 0.94 mg/dL (ref 0.44–1.00)
Glucose, Bld: 114 mg/dL — ABNORMAL HIGH (ref 65–99)
Potassium: 2.7 mmol/L — CL (ref 3.5–5.1)
SODIUM: 135 mmol/L (ref 135–145)
Total Bilirubin: 0.8 mg/dL (ref 0.3–1.2)
Total Protein: 7.1 g/dL (ref 6.5–8.1)

## 2016-10-01 LAB — URINE DRUG SCREEN, QUALITATIVE (ARMC ONLY)
Amphetamines, Ur Screen: NOT DETECTED
BARBITURATES, UR SCREEN: NOT DETECTED
BENZODIAZEPINE, UR SCRN: POSITIVE — AB
CANNABINOID 50 NG, UR ~~LOC~~: NOT DETECTED
Cocaine Metabolite,Ur ~~LOC~~: NOT DETECTED
MDMA (ECSTASY) UR SCREEN: NOT DETECTED
Methadone Scn, Ur: NOT DETECTED
Opiate, Ur Screen: POSITIVE — AB
PHENCYCLIDINE (PCP) UR S: NOT DETECTED
TRICYCLIC, UR SCREEN: NOT DETECTED

## 2016-10-01 LAB — CBC WITH DIFFERENTIAL/PLATELET
Basophils Absolute: 0.1 10*3/uL (ref 0–0.1)
Basophils Relative: 1 %
EOS ABS: 0.1 10*3/uL (ref 0–0.7)
EOS PCT: 1 %
HCT: 41.4 % (ref 35.0–47.0)
Hemoglobin: 13.8 g/dL (ref 12.0–16.0)
LYMPHS ABS: 1.9 10*3/uL (ref 1.0–3.6)
LYMPHS PCT: 17 %
MCH: 30.6 pg (ref 26.0–34.0)
MCHC: 33.2 g/dL (ref 32.0–36.0)
MCV: 92.2 fL (ref 80.0–100.0)
MONOS PCT: 7 %
Monocytes Absolute: 0.8 10*3/uL (ref 0.2–0.9)
Neutro Abs: 8.4 10*3/uL — ABNORMAL HIGH (ref 1.4–6.5)
Neutrophils Relative %: 74 %
PLATELETS: 368 10*3/uL (ref 150–440)
RBC: 4.49 MIL/uL (ref 3.80–5.20)
RDW: 13.6 % (ref 11.5–14.5)
WBC: 11.3 10*3/uL — ABNORMAL HIGH (ref 3.6–11.0)

## 2016-10-01 LAB — URINALYSIS COMPLETE WITH MICROSCOPIC (ARMC ONLY)
BACTERIA UA: NONE SEEN
BILIRUBIN URINE: NEGATIVE
Glucose, UA: NEGATIVE mg/dL
Hgb urine dipstick: NEGATIVE
KETONES UR: NEGATIVE mg/dL
LEUKOCYTES UA: NEGATIVE
NITRITE: NEGATIVE
Protein, ur: 30 mg/dL — AB
SPECIFIC GRAVITY, URINE: 1.024 (ref 1.005–1.030)
Squamous Epithelial / LPF: NONE SEEN
pH: 5 (ref 5.0–8.0)

## 2016-10-01 LAB — PROTIME-INR
INR: 1.08
PROTHROMBIN TIME: 14 s (ref 11.4–15.2)

## 2016-10-01 LAB — SURGICAL PCR SCREEN
MRSA, PCR: NEGATIVE
Staphylococcus aureus: NEGATIVE

## 2016-10-01 LAB — MAGNESIUM: MAGNESIUM: 1.6 mg/dL — AB (ref 1.7–2.4)

## 2016-10-01 LAB — TYPE AND SCREEN
ABO/RH(D): O POS
Antibody Screen: NEGATIVE

## 2016-10-01 LAB — CK: CK TOTAL: 372 U/L — AB (ref 38–234)

## 2016-10-01 LAB — TROPONIN I

## 2016-10-01 LAB — POTASSIUM: Potassium: 3.1 mmol/L — ABNORMAL LOW (ref 3.5–5.1)

## 2016-10-01 SURGERY — FIXATION, FRACTURE, INTERTROCHANTERIC, WITH INTRAMEDULLARY ROD
Anesthesia: General | Laterality: Right

## 2016-10-01 MED ORDER — ACETAMINOPHEN 325 MG PO TABS: 650.0000 mg | ORAL_TABLET | Freq: Four times a day (QID) | ORAL | Status: DC | PRN

## 2016-10-01 MED ORDER — MAGNESIUM OXIDE 400 (241.3 MG) MG PO TABS
400.0000 mg | ORAL_TABLET | Freq: Two times a day (BID) | ORAL | Status: DC
  Administered 2016-10-01 – 2016-10-04 (×7): 400 mg via ORAL
  Filled 2016-10-01 (×7): qty 1

## 2016-10-01 MED ORDER — ONDANSETRON HCL 4 MG/2ML IJ SOLN
INTRAMUSCULAR | Status: DC | PRN
  Administered 2016-10-01: 4 mg via INTRAVENOUS

## 2016-10-01 MED ORDER — OXYCODONE HCL 5 MG PO TABS: 5.0000 mg | ORAL_TABLET | Freq: Once | ORAL | Status: DC | PRN

## 2016-10-01 MED ORDER — DOCUSATE SODIUM 100 MG PO CAPS
100.0000 mg | ORAL_CAPSULE | Freq: Two times a day (BID) | ORAL | Status: DC
  Administered 2016-10-01: 100 mg via ORAL
  Filled 2016-10-01: qty 1

## 2016-10-01 MED ORDER — IPRATROPIUM-ALBUTEROL 0.5-2.5 (3) MG/3ML IN SOLN
3.0000 mL | Freq: Four times a day (QID) | RESPIRATORY_TRACT | Status: DC
  Administered 2016-10-01 – 2016-10-02 (×4): 3 mL via RESPIRATORY_TRACT
  Filled 2016-10-01 (×4): qty 3

## 2016-10-01 MED ORDER — ALUM & MAG HYDROXIDE-SIMETH 200-200-20 MG/5ML PO SUSP
30.0000 mL | ORAL | Status: DC | PRN
  Administered 2016-10-01: 30 mL via ORAL
  Filled 2016-10-01: qty 30

## 2016-10-01 MED ORDER — MIDAZOLAM HCL 2 MG/2ML IJ SOLN
INTRAMUSCULAR | Status: DC | PRN
  Administered 2016-10-01: 2 mg via INTRAVENOUS

## 2016-10-01 MED ORDER — LEVOTHYROXINE SODIUM 88 MCG PO TABS
88.0000 ug | ORAL_TABLET | Freq: Every day | ORAL | Status: DC
  Administered 2016-10-02 – 2016-10-04 (×3): 88 ug via ORAL
  Filled 2016-10-01 (×4): qty 1

## 2016-10-01 MED ORDER — METOCLOPRAMIDE HCL 5 MG/ML IJ SOLN: 5.0000 mg | Freq: Three times a day (TID) | INTRAMUSCULAR | Status: DC | PRN

## 2016-10-01 MED ORDER — ATORVASTATIN CALCIUM 20 MG PO TABS
40.0000 mg | ORAL_TABLET | Freq: Every day | ORAL | Status: DC
  Administered 2016-10-01 – 2016-10-04 (×4): 40 mg via ORAL
  Filled 2016-10-01 (×4): qty 2

## 2016-10-01 MED ORDER — SENNOSIDES-DOCUSATE SODIUM 8.6-50 MG PO TABS: 1.0000 | ORAL_TABLET | Freq: Every evening | ORAL | Status: DC | PRN

## 2016-10-01 MED ORDER — IPRATROPIUM-ALBUTEROL 18-103 MCG/ACT IN AERO: 2.0000 | INHALATION_SPRAY | Freq: Four times a day (QID) | RESPIRATORY_TRACT | Status: DC | PRN

## 2016-10-01 MED ORDER — SODIUM CHLORIDE 0.9 % IV BOLUS (SEPSIS)
1000.0000 mL | Freq: Once | INTRAVENOUS | Status: AC
  Administered 2016-10-01: 1000 mL via INTRAVENOUS

## 2016-10-01 MED ORDER — HYDROMORPHONE HCL 1 MG/ML IJ SOLN
1.0000 mg | INTRAMUSCULAR | Status: DC | PRN
  Administered 2016-10-02 – 2016-10-03 (×5): 2 mg via INTRAVENOUS
  Filled 2016-10-01 (×5): qty 2

## 2016-10-01 MED ORDER — ACETAMINOPHEN 650 MG RE SUPP: 650.0000 mg | Freq: Four times a day (QID) | RECTAL | Status: DC | PRN

## 2016-10-01 MED ORDER — SODIUM CHLORIDE 0.9 % IV SOLN
INTRAVENOUS | Status: DC
  Administered 2016-10-01: 13:00:00 via INTRAVENOUS

## 2016-10-01 MED ORDER — BISACODYL 10 MG RE SUPP: 10.0000 mg | Freq: Every day | RECTAL | Status: DC | PRN

## 2016-10-01 MED ORDER — CEFAZOLIN SODIUM-DEXTROSE 2-3 GM-% IV SOLR
INTRAVENOUS | Status: DC | PRN
  Administered 2016-10-01: 2 g via INTRAVENOUS

## 2016-10-01 MED ORDER — DIPHENHYDRAMINE HCL 12.5 MG/5ML PO ELIX: 12.5000 mg | ORAL_SOLUTION | ORAL | Status: DC | PRN

## 2016-10-01 MED ORDER — PANTOPRAZOLE SODIUM 40 MG PO TBEC
40.0000 mg | DELAYED_RELEASE_TABLET | Freq: Every day | ORAL | Status: DC
  Administered 2016-10-01 – 2016-10-04 (×4): 40 mg via ORAL
  Filled 2016-10-01 (×4): qty 1

## 2016-10-01 MED ORDER — POTASSIUM CHLORIDE CRYS ER 20 MEQ PO TBCR: 40.0000 meq | EXTENDED_RELEASE_TABLET | Freq: Once | ORAL | Status: DC

## 2016-10-01 MED ORDER — FENTANYL CITRATE (PF) 100 MCG/2ML IJ SOLN
INTRAMUSCULAR | Status: AC
  Administered 2016-10-01: 100 ug via INTRAVENOUS
  Filled 2016-10-01: qty 2

## 2016-10-01 MED ORDER — SUCCINYLCHOLINE CHLORIDE 20 MG/ML IJ SOLN
INTRAMUSCULAR | Status: DC | PRN
  Administered 2016-10-01: 100 mg via INTRAVENOUS

## 2016-10-01 MED ORDER — LIDOCAINE HCL (CARDIAC) 20 MG/ML IV SOLN
INTRAVENOUS | Status: DC | PRN
  Administered 2016-10-01: 30 mg via INTRAVENOUS

## 2016-10-01 MED ORDER — POTASSIUM CHLORIDE CRYS ER 20 MEQ PO TBCR
40.0000 meq | EXTENDED_RELEASE_TABLET | Freq: Once | ORAL | Status: AC
  Administered 2016-10-01: 40 meq via ORAL
  Filled 2016-10-01: qty 2

## 2016-10-01 MED ORDER — ALPRAZOLAM 1 MG PO TABS
1.0000 mg | ORAL_TABLET | Freq: Four times a day (QID) | ORAL | Status: DC | PRN
  Administered 2016-10-01 – 2016-10-03 (×3): 1 mg via ORAL
  Filled 2016-10-01: qty 2
  Filled 2016-10-01 (×2): qty 1

## 2016-10-01 MED ORDER — BUPIVACAINE-EPINEPHRINE (PF) 0.5% -1:200000 IJ SOLN
INTRAMUSCULAR | Status: DC | PRN
Start: 2016-10-01 — End: 2016-10-01
  Administered 2016-10-01: 30 mL via PERINEURAL

## 2016-10-01 MED ORDER — METOCLOPRAMIDE HCL 10 MG PO TABS
5.0000 mg | ORAL_TABLET | Freq: Three times a day (TID) | ORAL | Status: DC | PRN
Start: 2016-10-01 — End: 2016-10-04

## 2016-10-01 MED ORDER — ONDANSETRON HCL 4 MG PO TABS: 4.0000 mg | ORAL_TABLET | Freq: Four times a day (QID) | ORAL | Status: DC | PRN

## 2016-10-01 MED ORDER — BUPROPION HCL ER (SR) 150 MG PO TB12
150.0000 mg | ORAL_TABLET | Freq: Two times a day (BID) | ORAL | Status: DC
  Administered 2016-10-01 – 2016-10-04 (×7): 150 mg via ORAL
  Filled 2016-10-01 (×7): qty 1

## 2016-10-01 MED ORDER — SODIUM CHLORIDE 0.9 % IV SOLN
Freq: Once | INTRAVENOUS | Status: AC
  Administered 2016-10-01: 12:00:00 via INTRAVENOUS
  Filled 2016-10-01: qty 1000

## 2016-10-01 MED ORDER — IPRATROPIUM-ALBUTEROL 0.5-2.5 (3) MG/3ML IN SOLN
3.0000 mL | Freq: Four times a day (QID) | RESPIRATORY_TRACT | Status: DC | PRN
  Administered 2016-10-01 (×2): 3 mL via RESPIRATORY_TRACT
  Filled 2016-10-01: qty 3

## 2016-10-01 MED ORDER — NEOMYCIN-POLYMYXIN B GU 40-200000 IR SOLN
Status: AC
  Filled 2016-10-01: qty 2

## 2016-10-01 MED ORDER — MAGNESIUM SULFATE 2 GM/50ML IV SOLN
2.0000 g | Freq: Once | INTRAVENOUS | Status: AC
  Administered 2016-10-01: 2 g via INTRAVENOUS
  Filled 2016-10-01: qty 50

## 2016-10-01 MED ORDER — FENTANYL CITRATE (PF) 100 MCG/2ML IJ SOLN: 25.0000 ug | INTRAMUSCULAR | Status: DC | PRN

## 2016-10-01 MED ORDER — TIZANIDINE HCL 4 MG PO TABS
2.0000 mg | ORAL_TABLET | Freq: Three times a day (TID) | ORAL | Status: DC | PRN
  Administered 2016-10-02: 2 mg via ORAL
  Filled 2016-10-01: qty 1

## 2016-10-01 MED ORDER — CEFAZOLIN SODIUM-DEXTROSE 2-4 GM/100ML-% IV SOLN
2.0000 g | Freq: Four times a day (QID) | INTRAVENOUS | Status: AC
Start: 2016-10-01 — End: 2016-10-02
  Administered 2016-10-01 – 2016-10-02 (×3): 2 g via INTRAVENOUS
  Filled 2016-10-01 (×3): qty 100

## 2016-10-01 MED ORDER — ONDANSETRON HCL 4 MG/2ML IJ SOLN: 4.0000 mg | Freq: Four times a day (QID) | INTRAMUSCULAR | Status: DC | PRN

## 2016-10-01 MED ORDER — ESCITALOPRAM OXALATE 10 MG PO TABS
10.0000 mg | ORAL_TABLET | Freq: Every day | ORAL | Status: DC
  Administered 2016-10-01 – 2016-10-04 (×4): 10 mg via ORAL
  Filled 2016-10-01 (×4): qty 1

## 2016-10-01 MED ORDER — MAGNESIUM HYDROXIDE 400 MG/5ML PO SUSP
30.0000 mL | Freq: Every day | ORAL | Status: DC | PRN
  Administered 2016-10-03: 30 mL via ORAL
  Filled 2016-10-01: qty 30

## 2016-10-01 MED ORDER — LACTATED RINGERS IV SOLN
INTRAVENOUS | Status: DC | PRN
  Administered 2016-10-01 (×2): via INTRAVENOUS

## 2016-10-01 MED ORDER — ENOXAPARIN SODIUM 40 MG/0.4ML ~~LOC~~ SOLN: 40.0000 mg | SUBCUTANEOUS | Status: DC

## 2016-10-01 MED ORDER — FLEET ENEMA 7-19 GM/118ML RE ENEM
1.0000 | ENEMA | Freq: Once | RECTAL | Status: AC | PRN
Start: 2016-10-01 — End: 2016-10-04
  Administered 2016-10-04: 1 via RECTAL

## 2016-10-01 MED ORDER — DOCUSATE SODIUM 100 MG PO CAPS
100.0000 mg | ORAL_CAPSULE | Freq: Two times a day (BID) | ORAL | Status: DC
  Administered 2016-10-01 – 2016-10-04 (×6): 100 mg via ORAL
  Filled 2016-10-01 (×6): qty 1

## 2016-10-01 MED ORDER — SODIUM CHLORIDE 0.9 % IV SOLN: Freq: Once | INTRAVENOUS | Status: DC

## 2016-10-01 MED ORDER — PROPOFOL 10 MG/ML IV BOLUS
INTRAVENOUS | Status: DC | PRN
  Administered 2016-10-01: 120 mg via INTRAVENOUS

## 2016-10-01 MED ORDER — PNEUMOCOCCAL VAC POLYVALENT 25 MCG/0.5ML IJ INJ: 0.5000 mL | INJECTION | INTRAMUSCULAR | Status: DC

## 2016-10-01 MED ORDER — OXYCODONE HCL 5 MG PO TABS
5.0000 mg | ORAL_TABLET | ORAL | Status: DC | PRN
  Administered 2016-10-02: 10 mg via ORAL
  Filled 2016-10-01: qty 2

## 2016-10-01 MED ORDER — POTASSIUM CHLORIDE CRYS ER 20 MEQ PO TBCR: 40.0000 meq | EXTENDED_RELEASE_TABLET | ORAL | Status: DC

## 2016-10-01 MED ORDER — DEXAMETHASONE SODIUM PHOSPHATE 10 MG/ML IJ SOLN
INTRAMUSCULAR | Status: DC | PRN
  Administered 2016-10-01: 5 mg via INTRAVENOUS

## 2016-10-01 MED ORDER — FENTANYL CITRATE (PF) 100 MCG/2ML IJ SOLN
100.0000 ug | INTRAMUSCULAR | Status: DC | PRN
  Administered 2016-10-01: 100 ug via INTRAVENOUS

## 2016-10-01 MED ORDER — ENOXAPARIN SODIUM 40 MG/0.4ML ~~LOC~~ SOLN
40.0000 mg | SUBCUTANEOUS | Status: DC
  Administered 2016-10-02 – 2016-10-04 (×3): 40 mg via SUBCUTANEOUS
  Filled 2016-10-01 (×3): qty 0.4

## 2016-10-01 MED ORDER — OXYCODONE HCL 5 MG PO TABS: 5.0000 mg | ORAL_TABLET | ORAL | Status: DC | PRN

## 2016-10-01 MED ORDER — BUPIVACAINE-EPINEPHRINE (PF) 0.5% -1:200000 IJ SOLN
INTRAMUSCULAR | Status: AC
  Filled 2016-10-01: qty 30

## 2016-10-01 MED ORDER — MOMETASONE FURO-FORMOTEROL FUM 100-5 MCG/ACT IN AERO
2.0000 | INHALATION_SPRAY | Freq: Two times a day (BID) | RESPIRATORY_TRACT | Status: DC
  Administered 2016-10-02 – 2016-10-04 (×5): 2 via RESPIRATORY_TRACT
  Filled 2016-10-01: qty 8.8

## 2016-10-01 MED ORDER — KETOROLAC TROMETHAMINE 15 MG/ML IJ SOLN
15.0000 mg | Freq: Once | INTRAMUSCULAR | Status: AC
  Administered 2016-10-01: 15 mg via INTRAVENOUS
  Filled 2016-10-01: qty 1

## 2016-10-01 MED ORDER — TRAZODONE HCL 50 MG PO TABS
150.0000 mg | ORAL_TABLET | Freq: Every day | ORAL | Status: DC
  Administered 2016-10-01 – 2016-10-03 (×3): 150 mg via ORAL
  Filled 2016-10-01 (×3): qty 1

## 2016-10-01 MED ORDER — CEFAZOLIN SODIUM-DEXTROSE 2-4 GM/100ML-% IV SOLN
2.0000 g | INTRAVENOUS | Status: DC
  Filled 2016-10-01: qty 100

## 2016-10-01 MED ORDER — KETOROLAC TROMETHAMINE 15 MG/ML IJ SOLN: 15.0000 mg | Freq: Four times a day (QID) | INTRAMUSCULAR | Status: DC | PRN

## 2016-10-01 MED ORDER — FENTANYL CITRATE (PF) 100 MCG/2ML IJ SOLN
INTRAMUSCULAR | Status: DC | PRN
  Administered 2016-10-01: 100 ug via INTRAVENOUS

## 2016-10-01 MED ORDER — OXYCODONE HCL 5 MG/5ML PO SOLN: 5.0000 mg | Freq: Once | ORAL | Status: DC | PRN

## 2016-10-01 MED ORDER — POTASSIUM CHLORIDE 20 MEQ/15ML (10%) PO SOLN
20.0000 meq | ORAL | Status: AC
  Administered 2016-10-01: 20 meq via ORAL
  Filled 2016-10-01: qty 15

## 2016-10-01 MED ORDER — VITAMIN D 1000 UNITS PO TABS
1000.0000 [IU] | ORAL_TABLET | Freq: Every day | ORAL | Status: DC
  Administered 2016-10-01 – 2016-10-04 (×4): 1000 [IU] via ORAL
  Filled 2016-10-01 (×7): qty 1

## 2016-10-01 MED ORDER — ACETAMINOPHEN 500 MG PO TABS
1000.0000 mg | ORAL_TABLET | Freq: Four times a day (QID) | ORAL | Status: AC
  Administered 2016-10-01 – 2016-10-02 (×4): 1000 mg via ORAL
  Filled 2016-10-01 (×4): qty 2

## 2016-10-01 MED ORDER — KCL IN DEXTROSE-NACL 20-5-0.9 MEQ/L-%-% IV SOLN
INTRAVENOUS | Status: DC
Start: 2016-10-01 — End: 2016-10-02
  Administered 2016-10-01: 18:00:00 via INTRAVENOUS
  Filled 2016-10-01 (×4): qty 1000

## 2016-10-01 MED ORDER — MORPHINE SULFATE ER 30 MG PO TBCR
30.0000 mg | EXTENDED_RELEASE_TABLET | Freq: Two times a day (BID) | ORAL | Status: DC
  Administered 2016-10-01 – 2016-10-04 (×7): 30 mg via ORAL
  Filled 2016-10-01 (×2): qty 1
  Filled 2016-10-01: qty 2
  Filled 2016-10-01 (×4): qty 1

## 2016-10-01 MED ORDER — ENOXAPARIN SODIUM 40 MG/0.4ML ~~LOC~~ SOLN
40.0000 mg | SUBCUTANEOUS | Status: DC
  Filled 2016-10-01 (×2): qty 0.4

## 2016-10-01 MED ORDER — IPRATROPIUM-ALBUTEROL 0.5-2.5 (3) MG/3ML IN SOLN
RESPIRATORY_TRACT | Status: AC
  Administered 2016-10-01: 3 mL via RESPIRATORY_TRACT
  Filled 2016-10-01: qty 3

## 2016-10-01 MED ORDER — NEOMYCIN-POLYMYXIN B GU 40-200000 IR SOLN
Status: DC | PRN
  Administered 2016-10-01: 2 mL

## 2016-10-01 SURGICAL SUPPLY — 40 items
BIT DRILL 4.3MMS DISTAL GRDTED (BIT) IMPLANT
BNDG COHESIVE 4X5 TAN STRL (GAUZE/BANDAGES/DRESSINGS) ×2 IMPLANT
BNDG COHESIVE 6X5 TAN STRL LF (GAUZE/BANDAGES/DRESSINGS) ×2 IMPLANT
CANISTER SUCT 1200ML W/VALVE (MISCELLANEOUS) ×2 IMPLANT
CHLORAPREP W/TINT 26ML (MISCELLANEOUS) ×4 IMPLANT
DRAPE C-ARMOR (DRAPES) ×2 IMPLANT
DRAPE SHEET LG 3/4 BI-LAMINATE (DRAPES) ×2 IMPLANT
DRILL 4.3MMS DISTAL GRADUATED (BIT) ×2
DRSG OPSITE POSTOP 3X4 (GAUZE/BANDAGES/DRESSINGS) ×6 IMPLANT
DRSG OPSITE POSTOP 4X6 (GAUZE/BANDAGES/DRESSINGS) ×2 IMPLANT
ELECT CAUTERY BLADE 6.4 (BLADE) ×2 IMPLANT
ELECT REM PT RETURN 9FT ADLT (ELECTROSURGICAL) ×2
ELECTRODE REM PT RTRN 9FT ADLT (ELECTROSURGICAL) ×1 IMPLANT
GAUZE SPONGE 4X4 12PLY STRL (GAUZE/BANDAGES/DRESSINGS) ×1 IMPLANT
GLOVE BIO SURGEON STRL SZ8 (GLOVE) ×4 IMPLANT
GLOVE INDICATOR 8.0 STRL GRN (GLOVE) ×2 IMPLANT
GOWN STRL REUS W/ TWL LRG LVL3 (GOWN DISPOSABLE) ×1 IMPLANT
GOWN STRL REUS W/ TWL XL LVL3 (GOWN DISPOSABLE) ×1 IMPLANT
GOWN STRL REUS W/TWL LRG LVL3 (GOWN DISPOSABLE) ×2
GOWN STRL REUS W/TWL XL LVL3 (GOWN DISPOSABLE) ×2
GUIDEPIN VERSANAIL DSP 3.2X444 ×1 IMPLANT
GUIDEWIRE BALL NOSE 100CM (WIRE) ×1 IMPLANT
HFN RH 130 DEG 9MM X 320MM (Nail) ×1 IMPLANT
MAT BLUE FLOOR 46X72 FLO (MISCELLANEOUS) ×2 IMPLANT
NDL FILTER BLUNT 18X1 1/2 (NEEDLE) ×1 IMPLANT
NEEDLE FILTER BLUNT 18X 1/2SAF (NEEDLE) ×1
NEEDLE FILTER BLUNT 18X1 1/2 (NEEDLE) ×1 IMPLANT
NEEDLE HYPO 22GX1.5 SAFETY (NEEDLE) ×2 IMPLANT
NS IRRIG 500ML POUR BTL (IV SOLUTION) ×2 IMPLANT
PACK HIP COMPR (MISCELLANEOUS) ×2 IMPLANT
SCREW BONE CORTICAL 5.0X38 (Screw) ×1 IMPLANT
SCREW LAG HIP NAIL 10.5X95 (Screw) ×1 IMPLANT
SCREWDRIVER HEX TIP 3.5MM (MISCELLANEOUS) ×2 IMPLANT
STAPLER SKIN PROX 35W (STAPLE) ×2 IMPLANT
STRAP SAFETY BODY (MISCELLANEOUS) ×2 IMPLANT
SUT VIC AB 1 CT1 36 (SUTURE) ×2 IMPLANT
SUT VIC AB 2-0 CT1 (SUTURE) ×4 IMPLANT
SYR 30ML LL (SYRINGE) ×2 IMPLANT
SYRINGE 10CC LL (SYRINGE) ×2 IMPLANT
TAPE MICROFOAM 4IN (TAPE) ×1 IMPLANT

## 2016-10-01 NOTE — Transfer of Care (Signed)
Immediate Anesthesia Transfer of Care Note  Patient: [place]  Procedure(s) Performed: Procedure(s): INTRAMEDULLARY (IM) NAIL INTERTROCHANTRIC (Right)  Patient Location: PACU  Anesthesia Type:General  Level of Consciousness: awake, alert  and oriented  Airway & Oxygen Therapy: Patient Spontanous Breathing and Patient connected to face mask oxygen  Post-op Assessment: Report given to RN and Post -op Vital signs reviewed and stable  Post vital signs: Reviewed and stable  Last Vitals:  Vitals:   10/01/16 1141 10/01/16 1622  BP: (!) 151/75   Pulse: (!) 118   Resp:    Temp:  (P) 36.4 C    Last Pain:  Vitals:   10/01/16 1237  TempSrc:   PainSc: 8       Patients Stated Pain Goal: 2 ([id number])  Complications: No apparent anesthesia complications

## 2016-10-01 NOTE — Anesthesia Postprocedure Evaluation (Signed)
Anesthesia Post Note  Patient: [name]  Procedure(s) Performed: Procedure(s) (LRB): INTRAMEDULLARY (IM) NAIL INTERTROCHANTRIC (Right)  Patient location during evaluation: PACU Anesthesia Type: General Level of consciousness: awake and alert Pain management: pain level controlled Vital Signs Assessment: post-procedure vital signs reviewed and stable Respiratory status: spontaneous breathing, nonlabored ventilation, respiratory function stable and patient connected to nasal cannula oxygen Cardiovascular status: blood pressure returned to baseline and stable Postop Assessment: no signs of nausea or vomiting Anesthetic complications: no    Last Vitals:  Vitals:   10/01/16 1622 10/01/16 1730  BP:  131/78  Pulse:  (!) 115  Resp:  18  Temp: 36.4 C 36.6 C    Last Pain:  Vitals:   10/01/16 1730  TempSrc: Oral  PainSc:                  Precious Haws [place]

## 2016-10-01 NOTE — Op Note (Signed)
10/01/2016  4:23 PM  Patient:   [name]  Pre-Op Diagnosis:   Displaced two-part intertrochanteric right hip fracture.  Post-Op Diagnosis:   Same.  Procedure:   Reduction and internal fixation of displaced intertrochanteric right hip fracture with Biomet Affixis TFN nail.  Surgeon:   Pascal Lux, MD  Assistant:   None  Anesthesia:   GET  Findings:   As above  Complications:   None  EBL:   100 cc  Fluids:   1000 cc crystalloid  UOP:   75 cc  TT:   None  Drains:   None  Closure:   Staples  Implants:   Biomet Affixis 9 x 320 mm TFN with a 95 mm lag screw and a 38 mm distal interlocking screw  Brief Clinical Note:   The patient is a 70 year old female who sustained the above-noted injury 5 days ago. Initially, she refused to seek medical attention and "ragged" herself around the floor until she was convinced to come to the emergency room where x-rays demonstrated the above-noted fracture. The patient has been cleared medically and presents at this time for reduction and internal fixation of the right hip fracture.  Procedure:   The patient was brought into the operating room. After adequate general endotracheal intubation and anesthesia was obtained, the patient was lain in the supine position on the fracture table. The uninjured leg was placed in a flexed and abducted position while the injured lower extremity was placed in longitudinal traction. The fracture was reduced using longitudinal traction and internal rotation. The adequacy of reduction was verified fluoroscopically in AP and lateral projections and found to be near anatomic. The lateral aspects of the right hip and thigh were prepped with ChloraPrep solution before being draped sterilely. Preoperative antibiotics were administered. A timeout was performed to verify the appropriate surgical site. The greater trochanter was identified fluoroscopically and an approximately 3 cm incision made about 2-3  fingerbreadths above the tip of the greater trochanter. The incision was carried down through the subcutaneous tissues to expose the gluteal fascia. This was split the length of the incision, providing access to the tip of the trochanter. Under fluoroscopic guidance, a guidewire was drilled through the tip of the trochanter into the proximal metaphysis to the level of the lesser trochanter. After verifying its position fluoroscopically in AP and lateral projections, it was overreamed with the initial reamer to the depth of the lesser trochanter. A guidewire was passed down through the femoral canal to the supracondylar region. The adequacy of guidewire position was verified fluoroscopically in AP and lateral projections before the length of the guidewire within the canal was measured and found to be 340 mm. Therefore, a 320 mm length nail was selected. The guidewire was overreamed sequentially using the flexible reamers, beginning with a 10 mm reamer and progressing to an 11.0 mm reamer. This provided good cortical chatter. The 9 x 320 mm Biomet Affixis TFN rod was selected and advanced to the appropriate depth, as verified fluoroscopically. The guide system for the lag screw was positioned and advanced through an approximately 2 cm stab incision over the lateral aspect of the proximal femur. The guidewire was drilled up through the trochanteric femoral nail and into the femoral neck to rest within 5 mm of subchondral bone. After verifying its position in the femoral neck and head in both AP and lateral projections, the guidewire was measured and found to be optimally replicated by a 95 mm lag screw. The  guidewire was overreamed to the appropriate depth before the lag screw was inserted and advanced to the appropriate depth as verified fluoroscopically in AP and lateral projections. The locking screw was advanced, then backed off a quarter turn to set the lag screw. Again the adequacy of hardware position and  fracture reduction was verified fluoroscopically in AP and lateral projections and found to be excellent.  Attention was directed distally. Using the "perfect circle" technique, the leg and fluoroscopy machine were positioned appropriately. An approximate 1.5 cm stab incision was made over the skin at the appropriate point before the drill bit was advanced through the cortex and across the static hole of the nail. The appropriate length of the screw was determined before the 38 mm distal interlocking screw was positioned, then advanced and tightened securely. Again the adequacy of screw position was verified fluoroscopically in AP and lateral projections and found to be excellent.  The wounds were irrigated thoroughly with sterile saline solution before the deeper subcutaneous tissues were closed using 2-0 Vicryl interrupted sutures. The skin was closed using staples. A total of 30 cc of 0.5% Sensorcaine with epinephrine was injected in and around all incisions. Sterile occlusive dressings were applied to all wounds before the patient was transferred back to his/her hospital bed. The patient was then transferred to the recovery room in satisfactory condition after tolerating the procedure well.

## 2016-10-01 NOTE — H&P (Signed)
[facility] [place] NAME: [name]    MR#:  [id number]  DATE OF BIRTH:  [birth year]  DATE OF ADMISSION:  10/01/2016  PRIMARY CARE PHYSICIAN: Albina Billet, MD   REQUESTING/REFERRING PHYSICIAN: 10/01/16  CHIEF COMPLAINT:  Right hip pain  HISTORY OF PRESENT ILLNESS:  [name]  is a 70 y.o. female with a known history of CAD, COPDAnd chronic pain and/or narcotics through pain clinic, anxiety, depression comes to the emergency room after she had fallen in her garage on Tuesday. Patient decided not to come here and will continue to scoot around at home with right hip pain. She was unable to bear weight however she is to call. Her son made her come to the emergency room and IVC papers were created by ER MD. Patient currently is doing stable she still is many questions about her surgery. I explained to the patient that surgery is the only option at present if she wants to walk again. She is agreeable to get the surgery done. Patient denies any chest pain shortness of breath. She has right hip pain. She denies any history of MI in the past although her records does show coronary artery disease listed.  PAST MEDICAL HISTORY:   Past Medical History:  Diagnosis Date  . Anxiety   . CAD (coronary artery disease)   . Carpal tunnel syndrome   . Complication of anesthesia    has woken up at times  . COPD (chronic obstructive pulmonary disease) ([place])   . CRP elevated 09/14/2015  . Depression   . Dyslipidemia   . Dysrhythmia    hx palpatations  . Elevated sedimentation rate 09/14/2015  . Esophageal spasm   . Gastrointestinal parasites   . Hiatal hernia   . History of peptic ulcer disease   . Hyperthyroidism   . Hypothyroidism   . Low magnesium levels 09/14/2015  . Pelvic fracture ([place]) 2008   fall from riding a horse  . Reflux   . Rotator cuff injury     PAST SURGICAL HISTOIRY:   Past Surgical History:  Procedure Laterality Date   . CARPAL TUNNEL RELEASE    . CESAREAN SECTION    . CHOLECYSTECTOMY    . NECK SURGERY    . ROTATOR CUFF REPAIR     x2  . SHOULDER ARTHROSCOPY  12/07/2011   Procedure: ARTHROSCOPY SHOULDER;  Surgeon: Ninetta Lights, MD;  Location: [facility];  Service: Orthopedics;  Laterality: Right;  Debridement Partial Cuff Tear, Release Coracoacromial Ligament  . SHOULDER SURGERY  12/07/2011   right    SOCIAL HISTORY:   Social History  Substance Use Topics  . Smoking status: Current Every Day Smoker    Packs/day: 0.50    Types: Cigarettes  . Smokeless tobacco: Never Used  . Alcohol use No    FAMILY HISTORY:   Family History  Problem Relation Age of Onset  . Aneurysm      DRUG ALLERGIES:  No Known Allergies  REVIEW OF SYSTEMS:  Review of Systems  Constitutional: Negative for chills, fever and weight loss.  HENT: Negative for ear discharge, ear pain and nosebleeds.   Eyes: Negative for blurred vision, pain and discharge.  Respiratory: Negative for sputum production, shortness of breath, wheezing and stridor.   Cardiovascular: Negative for chest pain, palpitations, orthopnea and PND.  Gastrointestinal: Negative for abdominal pain, diarrhea, nausea and vomiting.  Genitourinary: Negative for frequency and urgency.  Musculoskeletal: Positive for back  pain and joint pain.  Neurological: Positive for weakness. Negative for sensory change, speech change and focal weakness.  Psychiatric/Behavioral: Negative for depression and hallucinations. The patient is not nervous/anxious.      MEDICATIONS AT HOME:   Prior to Admission medications   Medication Sig Start Date End Date Taking? Authorizing Provider  albuterol-ipratropium (COMBIVENT) 18-103 MCG/ACT inhaler Inhale 2 puffs into the lungs every 6 (six) hours as needed.   Yes Historical Provider, MD  ALPRAZolam Duanne Moron) 1 MG tablet Take 1 mg by mouth 4 (four) times daily as needed.    Yes Historical Provider, MD  atorvastatin  (LIPITOR) 40 MG tablet Take 40 mg by mouth daily.   Yes Historical Provider, MD  buPROPion (WELLBUTRIN SR) 150 MG 12 hr tablet Take 150 mg by mouth 2 (two) times daily.   Yes Historical Provider, MD  Cholecalciferol (VITAMIN D3) 5000 UNITS TABS Take 1 tablet by mouth daily.   Yes Historical Provider, MD  escitalopram (LEXAPRO) 10 MG tablet Take 10 mg by mouth daily.  10/15/13  Yes Historical Provider, MD  Fluticasone-Salmeterol (ADVAIR) 100-50 MCG/DOSE AEPB Inhale 1 puff into the lungs every 12 (twelve) hours.   Yes Historical Provider, MD  levothyroxine (SYNTHROID, LEVOTHROID) 88 MCG tablet Take 88 mcg by mouth daily.     Yes Historical Provider, MD  magnesium oxide (MAG-OX) 400 MG tablet Take 400 mg by mouth 2 (two) times daily.   Yes Historical Provider, MD  morphine (MS CONTIN) 30 MG 12 hr tablet Take 1 tablet (30 mg total) by mouth every 12 (twelve) hours. 09/30/16 10/30/16 Yes Milinda Pointer, MD  tizanidine (ZANAFLEX) 2 MG capsule Take 1 capsule (2 mg total) by mouth 3 (three) times daily as needed for muscle spasms. 08/31/16 11/29/16 Yes Milinda Pointer, MD  traZODone (DESYREL) 150 MG tablet Take 150 mg by mouth at bedtime.     Yes Historical Provider, MD      VITAL SIGNS:  Blood pressure 140/74, pulse (!) 106, temperature 98.2 F (36.8 C), temperature source Oral, resp. rate 16, height 5\' 5"  (1.651 m), weight 52.2 kg (115 lb), SpO2 98 %.  PHYSICAL EXAMINATION:  GENERAL:  70 y.o.-year-old patient lying in the bed with no acute distress.  EYES: Pupils equal, round, reactive to light and accommodation. No scleral icterus. Extraocular muscles intact.  HEENT: Head atraumatic, normocephalic. Oropharynx and nasopharynx clear.  NECK:  Supple, no jugular venous distention. No thyroid enlargement, no tenderness.  LUNGS: distant breath sounds bilaterally, no wheezing, rales,rhonchi or crepitation. No use of accessory muscles of respiration.  CARDIOVASCULAR: S1, S2 normal. No murmurs, rubs, or  gallops.  ABDOMEN: Soft, nontender, nondistended. Bowel sounds present. No organomegaly or mass.  EXTREMITIES: No pedal edema, cyanosis, or clubbing. Right leg rotated NEUROLOGIC: Cranial nerves II through XII are intact. Muscle strength 5/5 in all extremities. Sensation intact. Gait not checked.  PSYCHIATRIC: The patient is alert and oriented x 3.  SKIN: No obvious rash, lesion, or ulcer.   LABORATORY PANEL:   CBC  Recent Labs Lab 10/01/16 0825  WBC 11.3*  HGB 13.8  HCT 41.4  PLT 368   ------------------------------------------------------------------------------------------------------------------  Chemistries   Recent Labs Lab 10/01/16 0825  NA 135  K 2.7*  CL 100*  CO2 27  GLUCOSE 114*  BUN 18  CREATININE 0.94  CALCIUM 8.7*  MG 1.6*  AST 30  ALT 25  ALKPHOS 67  BILITOT 0.8   ------------------------------------------------------------------------------------------------------------------  Cardiac Enzymes  Recent Labs Lab 10/01/16 0825  TROPONINI <0.03   ------------------------------------------------------------------------------------------------------------------  RADIOLOGY:  Dg Chest 1 View  Result Date: 10/01/2016 CLINICAL DATA:  Preoperative respiratory exam for right-sided hip fracture. EXAM: CHEST 1 VIEW COMPARISON:  07/03/2013 FINDINGS: The heart size and mediastinal contours are within normal limits. There is no evidence of pulmonary edema, consolidation, pneumothorax, nodule or pleural fluid. The visualized skeletal structures are unremarkable. IMPRESSION: No active disease. Electronically Signed   By: Aletta Edouard M.D.   On: 10/01/2016 09:18   Ct Head Wo Contrast  Result Date: 10/01/2016 CLINICAL DATA:  Patient fell several days ago and has been "scooting" around since that time. The patient is unable to get up and walk. EXAM: CT HEAD WITHOUT CONTRAST CT CERVICAL SPINE WITHOUT CONTRAST TECHNIQUE: Multidetector CT imaging of the head and  cervical spine was performed following the standard protocol without intravenous contrast. Multiplanar CT image reconstructions of the cervical spine were also generated. COMPARISON:  MRI of the cervical spine February 10, 2014 FINDINGS: CT HEAD FINDINGS Brain: No subdural, epidural, or subarachnoid hemorrhage. Cerebellum, brainstem, and basal cisterns are within normal limits. No mass, mass effect, or midline shift. The ventricles and sulci are prominent, likely age related. Moderate white matter changes are seen. No acute cortical ischemia or infarct. Vascular: Calcified atherosclerosis in the intracranial carotid arteries. Skull: Normal. Negative for fracture or focal lesion. Sinuses/Orbits: No acute finding. Other: No other abnormalities. CT CERVICAL SPINE FINDINGS Alignment: There is straightening of normal lordosis. Anterior plate and screws are seen from C3 through C7. Hardware is intact. No traumatic malalignment. Skull base and vertebrae: Evaluation of C3 through C7 is limited due to streak artifact off of surgical hardware. The hardware is intact with no evidence of failure. No fractures are seen through these levels. Mild anterior wedging of T1 is stable since comparison MRI. Mild anterior wedging of T2 is also stable. No fractures are seen through the levels of C1 or C2 either. The alignment at C2-3 is due to mild flexion during the study. No prevertebral soft tissue swelling identified. The pre odontoid space is normal. Soft tissues and spinal canal: No prevertebral fluid or swelling. No visible canal hematoma. Disc levels:  Facet degenerative changes. Upper chest: Emphysematous changes in the lung apices. Other: No other abnormalities. IMPRESSION: 1. No acute intracranial abnormality. 2. Postsurgical changes in the cervical spine with no fracture or traumatic malalignment. Electronically Signed   By: Dorise Bullion III M.D   On: 10/01/2016 09:21   Ct Cervical Spine Wo Contrast  Result Date:  10/01/2016 CLINICAL DATA:  Patient fell several days ago and has been "scooting" around since that time. The patient is unable to get up and walk. EXAM: CT HEAD WITHOUT CONTRAST CT CERVICAL SPINE WITHOUT CONTRAST TECHNIQUE: Multidetector CT imaging of the head and cervical spine was performed following the standard protocol without intravenous contrast. Multiplanar CT image reconstructions of the cervical spine were also generated. COMPARISON:  MRI of the cervical spine February 10, 2014 FINDINGS: CT HEAD FINDINGS Brain: No subdural, epidural, or subarachnoid hemorrhage. Cerebellum, brainstem, and basal cisterns are within normal limits. No mass, mass effect, or midline shift. The ventricles and sulci are prominent, likely age related. Moderate white matter changes are seen. No acute cortical ischemia or infarct. Vascular: Calcified atherosclerosis in the intracranial carotid arteries. Skull: Normal. Negative for fracture or focal lesion. Sinuses/Orbits: No acute finding. Other: No other abnormalities. CT CERVICAL SPINE FINDINGS Alignment: There is straightening of normal lordosis. Anterior plate and screws are seen from C3 through C7. Hardware is intact. No traumatic  malalignment. Skull base and vertebrae: Evaluation of C3 through C7 is limited due to streak artifact off of surgical hardware. The hardware is intact with no evidence of failure. No fractures are seen through these levels. Mild anterior wedging of T1 is stable since comparison MRI. Mild anterior wedging of T2 is also stable. No fractures are seen through the levels of C1 or C2 either. The alignment at C2-3 is due to mild flexion during the study. No prevertebral soft tissue swelling identified. The pre odontoid space is normal. Soft tissues and spinal canal: No prevertebral fluid or swelling. No visible canal hematoma. Disc levels:  Facet degenerative changes. Upper chest: Emphysematous changes in the lung apices. Other: No other abnormalities.  IMPRESSION: 1. No acute intracranial abnormality. 2. Postsurgical changes in the cervical spine with no fracture or traumatic malalignment. Electronically Signed   By: Dorise Bullion III M.D   On: 10/01/2016 09:21   Dg Hip Unilat W Or Wo Pelvis 2-3 Views Right  Result Date: 10/01/2016 CLINICAL DATA:  Fall EXAM: DG HIP (WITH OR WITHOUT PELVIS) 2-3V RIGHT COMPARISON:  None FINDINGS: There is an acute and comminuted intertrochanteric fracture involving the proximal right femur. Medial angulation of the distal fracture fragments noted. No dislocation. IMPRESSION: 1. Acute and comminuted intertrochanteric fracture of the proximal right femur. Electronically Signed   By: Kerby Moors M.D.   On: 10/01/2016 09:18    EKG:  Sinus tachycardia. No acute ST elevation or depression.  IMPRESSION AND PLAN:  Ordella Bechtold  is a 70 y.o. female with a known history of CAD, COPDAnd chronic pain and/or narcotics through pain clinic, anxiety, depression comes to the emergency room after she had fallen in her garage on Tuesday. Patient decided not to come here and will continue to scoot around at home with right hip pain. She was unable to bear weight however she is to call. Her son made her come to the emergency room   1. Acute right intertrochanteric fracture status post fall past Tuesday -Internal medicine was consulted for preop evaluation -Patient is at a low to intermediate risk for surgery. She is functional at home. She denies any chest pain or shortness of breath. -Incentive spirometer, early physical therapy, DVT prophylaxis  2. Chronic pain with chronic narcotic use. Patient [facility] the [facility] clinic [facility] at [facility] her pain meds and Zanaflex  3. COPD with ongoing tobacco abuse -Patient advised smoking cessation 3 minutes spent -Nicotine patch, incentive spirometer, early ambulation  4. Hypothyroidism continue Synthroid  5. Hyperlipidemia continue statins  6. DVT prophylaxis SCD and teds for now  and Lovenox after surgery  Above was discussed with patient, patient's brother and sister-in-law. Dr Roland Rack is aware   All the records are reviewed and case discussed with ED provider. Management plans discussed with the patient, family and they are in agreement.  CODE STATUS: Full  TOTAL TIME TAKING CARE OF THIS PATIENT: 50 minutes.    Naiyana Barbian M.D on 10/01/2016 at 11:34 AM  Between 7am to 6pm - Pager - [phone]  After 6pm go to [email or web address] - password [email or web address] [facility]  Office  [phone]  CC: Primary care physician; Albina Billet, MD

## 2016-10-01 NOTE — Anesthesia Preprocedure Evaluation (Signed)
Anesthesia Evaluation  Patient identified by MRN, date of birth, ID band Patient awake    Reviewed: Allergy & Precautions, H&P , NPO status , Patient's Chart, lab work & pertinent test results  History of Anesthesia Complications (+) DIFFICULT AIRWAY and history of anesthetic complications  Airway Mallampati: III  TM Distance: <3 FB Neck ROM: limited    Dental no notable dental hx. (+) Poor Dentition, Chipped, Missing   Pulmonary shortness of breath and with exertion, COPD,  COPD inhaler, Current Smoker,    Pulmonary exam normal breath sounds clear to auscultation       Cardiovascular Exercise Tolerance: Poor (-) angina+ CAD  (-) Past MI Normal cardiovascular exam+ dysrhythmias  Rhythm:regular Rate:Normal     Neuro/Psych PSYCHIATRIC DISORDERS Anxiety Depression  Neuromuscular disease negative psych ROS   GI/Hepatic Neg liver ROS, hiatal hernia, GERD  Controlled,  Endo/Other  negative endocrine ROSHypothyroidism Hyperthyroidism   Renal/GU      Musculoskeletal  (+) Arthritis ,   Abdominal   Peds  Hematology negative hematology ROS (+)   Anesthesia Other Findings Past Medical History: No date: Anxiety No date: CAD (coronary artery disease) No date: Carpal tunnel syndrome No date: Complication of anesthesia     Comment: has woken up at times No date: COPD (chronic obstructive pulmonary disease) (* 09/14/2015: CRP elevated No date: Depression No date: Difficult intubation No date: Dyslipidemia No date: Dysrhythmia     Comment: hx palpatations 09/14/2015: Elevated sedimentation rate No date: Esophageal spasm No date: Gastrointestinal parasites No date: Hiatal hernia No date: History of peptic ulcer disease No date: Hyperthyroidism No date: Hypothyroidism 09/14/2015: Low magnesium levels 2008: Pelvic fracture ([place])     Comment: fall from riding a horse No date: Reflux No date: Rotator cuff injury  Past  Surgical History: No date: CARPAL TUNNEL RELEASE No date: CESAREAN SECTION No date: CHOLECYSTECTOMY No date: NECK SURGERY No date: ROTATOR CUFF REPAIR     Comment: x2 12/07/2011: SHOULDER ARTHROSCOPY     Comment: Procedure: ARTHROSCOPY SHOULDER;  Surgeon:               Ninetta Lights, MD;  Location: [facility];  Service: Orthopedics;                Laterality: Right;  Debridement Partial Cuff               Tear, Release Coracoacromial Ligament 12/07/2011: SHOULDER SURGERY     Comment: right  BMI    Body Mass Index:  19.74 kg/m      Reproductive/Obstetrics negative OB ROS                             Anesthesia Physical Anesthesia Plan  ASA: III  Anesthesia Plan: General ETT   Post-op Pain Management:    Induction:   Airway Management Planned: Oral ETT and Video Laryngoscope Planned  Additional Equipment:   Intra-op Plan:   Post-operative Plan: Possible Post-op intubation/ventilation  Informed Consent: I have reviewed the patients History and Physical, chart, labs and discussed the procedure including the risks, benefits and alternatives for the proposed anesthesia with the patient or authorized representative who has indicated his/her understanding and acceptance.   Dental advisory given  Plan Discussed with: Anesthesiologist, CRNA and Surgeon  Anesthesia Plan Comments: (Patient and family members consented. At this time I feel that the  patient is not a candidate for a spinal anesthetic due to her laboratory work reflecting some degree of under resuscitation.  I feel that the risk for GA for this patient is lower than that of a spinal anesthetic, patient and family members agree with this plan. Patient and family informed that patient is higher risk for complications from anesthesia during this procedure due to their medical history and age including but not limited to post operative cognitive dysfunction.  They  voiced understanding.  )        Anesthesia Quick Evaluation

## 2016-10-01 NOTE — Progress Notes (Signed)
MEDICATION RELATED CONSULT NOTE - INITIAL   Pharmacy Consult for Electrolyte Monitoring Indication: Hypokalemia  No Known Allergies  Patient Measurements: Height: 5\' 4"  (162.6 cm) Weight: 115 lb (52.2 kg) IBW/kg (Calculated) : 54.7   Vital Signs: Temp: 97.7 F (36.5 C) (11/12 1944) Temp Source: Oral (11/12 1944) BP: 114/60 (11/12 1944) Pulse Rate: 96 (11/12 1944) Intake/Output from previous day: No intake/output data recorded. Intake/Output from this shift: No intake/output data recorded.  Labs:  Recent Labs  10/01/16 0825  WBC 11.3*  HGB 13.8  HCT 41.4  PLT 368  CREATININE 0.94  MG 1.6*  ALBUMIN 3.4*  PROT 7.1  AST 30  ALT 25  ALKPHOS 67  BILITOT 0.8   Estimated Creatinine Clearance: 46.5 mL/min (by C-G formula based on SCr of 0.94 mg/dL).   Microbiology: Recent Results (from the past 720 hour(s))  Surgical PCR screen     Status: None   Collection Time: 10/01/16  1:26 PM  Result Value Ref Range Status   MRSA, PCR NEGATIVE NEGATIVE Final   Staphylococcus aureus NEGATIVE NEGATIVE Final    Comment:        The Xpert SA Assay (FDA approved for NASAL specimens in patients over 21 years of age), is one component of a comprehensive surveillance program.  Test performance has been validated by [facility] for patients greater than or equal to 52 year old. It is not intended to diagnose infection nor to guide or monitor treatment.     Medical History: Past Medical History:  Diagnosis Date  . Anxiety   . CAD (coronary artery disease)   . Carpal tunnel syndrome   . Complication of anesthesia    has woken up at times  . COPD (chronic obstructive pulmonary disease) ([place])   . CRP elevated 09/14/2015  . Depression   . Difficult intubation   . Dyslipidemia   . Dysrhythmia    hx palpatations  . Elevated sedimentation rate 09/14/2015  . Esophageal spasm   . Gastrointestinal parasites   . Hiatal hernia   . History of peptic ulcer disease   .  Hyperthyroidism   . Hypothyroidism   . Low magnesium levels 09/14/2015  . Pelvic fracture ([place]) 2008   fall from riding a horse  . Reflux   . Rotator cuff injury     Assessment: 70 y/o F with a h/o CAD and COPD admitted with hip fracture, hypokalemia. Pharmacy consulted to assist in managing electrolytes.   Plan:  Magnesium sulfate 2 g iv once. Potassium choride 40 meq tab po given x 1 and 20MEQ packet x 1 given. K at 1300 was 3.1. At that time 80 MEQ/1041ml @ 10MEQ/hr was running. Will recheck electrolytes in the AM.  Melissa D Maccia 10/01/2016,7:46 PM

## 2016-10-01 NOTE — Consult Note (Signed)
ORTHOPAEDIC CONSULTATION  REQUESTING PHYSICIAN: Fritzi Mandes, MD  Chief Complaint:   Right hip pain status post fall.  History of Present Illness: [name] is a 70 y.o. female with multiple medical problems, including prescription narcotic abuse, COPD, coronary artery disease, peptic ulcer disease, thyroid dysfunction, dyslipidemia, and anxiety, who lives alone and independently. Apparently she fell onto a concrete floor 5 days ago, injuring her right hip. She refused to seek treatment initially, thinking that she could "work through it". She was checked on repeatedly, including by her son, but continually refused to be brought to the emergency room. Finally, she consented this morning. She was brought to the emergency room where x-rays demonstrated a displaced intertrochanteric fracture of her right hip. The patient denies any associated injury resulting from her fall. She also denies any lightheadedness, dizziness, chest pain, shortness of breath, or other symptoms that may have precipitated her fall.  Past Medical History:  Diagnosis Date  . Anxiety   . CAD (coronary artery disease)   . Carpal tunnel syndrome   . Complication of anesthesia    has woken up at times  . COPD (chronic obstructive pulmonary disease) ([place])   . CRP elevated 09/14/2015  . Depression   . Dyslipidemia   . Dysrhythmia    hx palpatations  . Elevated sedimentation rate 09/14/2015  . Esophageal spasm   . Gastrointestinal parasites   . Hiatal hernia   . History of peptic ulcer disease   . Hyperthyroidism   . Hypothyroidism   . Low magnesium levels 09/14/2015  . Pelvic fracture ([place]) 2008   fall from riding a horse  . Reflux   . Rotator cuff injury    Past Surgical History:  Procedure Laterality Date  . CARPAL TUNNEL RELEASE    . CESAREAN SECTION    . CHOLECYSTECTOMY    . NECK SURGERY    . ROTATOR CUFF REPAIR     x2  . SHOULDER  ARTHROSCOPY  12/07/2011   Procedure: ARTHROSCOPY SHOULDER;  Surgeon: Ninetta Lights, MD;  Location: [place];  Service: Orthopedics;  Laterality: Right;  Debridement Partial Cuff Tear, Release Coracoacromial Ligament  . SHOULDER SURGERY  12/07/2011   right   Social History   Social History  . Marital status: Married    Spouse name: N/A  . Number of children: N/A  . Years of education: N/A   Occupational History  . retired [employer] cabin [employer]    Social History Main Topics  . Smoking status: Current Every Day Smoker    Packs/day: 0.50    Types: Cigarettes  . Smokeless tobacco: Never Used  . Alcohol use No  . Drug use: No  . Sexual activity: Not Asked   Other Topics Concern  . None   Social History Narrative  . None   Family History  Problem Relation Age of Onset  . Aneurysm     No Known Allergies Prior to Admission medications   Medication Sig Start Date End Date Taking? Authorizing Provider  albuterol-ipratropium (COMBIVENT) 18-103 MCG/ACT inhaler Inhale 2 puffs into the lungs every 6 (six) hours as needed.   Yes Historical Provider, MD  ALPRAZolam Duanne Moron) 1 MG tablet Take 1 mg by mouth 4 (four) times daily as needed.    Yes Historical Provider, MD  atorvastatin (LIPITOR) 40 MG tablet Take 40 mg by mouth daily.   Yes Historical Provider, MD  buPROPion (WELLBUTRIN SR) 150 MG 12 hr tablet Take 150 mg by mouth 2 (two) times  daily.   Yes Historical Provider, MD  Cholecalciferol (VITAMIN D3) 5000 UNITS TABS Take 1 tablet by mouth daily.   Yes Historical Provider, MD  escitalopram (LEXAPRO) 10 MG tablet Take 10 mg by mouth daily.  10/15/13  Yes Historical Provider, MD  Fluticasone-Salmeterol (ADVAIR) 100-50 MCG/DOSE AEPB Inhale 1 puff into the lungs every 12 (twelve) hours.   Yes Historical Provider, MD  levothyroxine (SYNTHROID, LEVOTHROID) 88 MCG tablet Take 88 mcg by mouth daily.     Yes Historical Provider, MD  magnesium oxide (MAG-OX) 400 MG tablet Take 400 mg  by mouth 2 (two) times daily.   Yes Historical Provider, MD  morphine (MS CONTIN) 30 MG 12 hr tablet Take 1 tablet (30 mg total) by mouth every 12 (twelve) hours. 09/30/16 10/30/16 Yes Milinda Pointer, MD  tizanidine (ZANAFLEX) 2 MG capsule Take 1 capsule (2 mg total) by mouth 3 (three) times daily as needed for muscle spasms. 08/31/16 11/29/16 Yes Milinda Pointer, MD  traZODone (DESYREL) 150 MG tablet Take 150 mg by mouth at bedtime.     Yes Historical Provider, MD   Dg Chest 1 View  Result Date: 10/01/2016 CLINICAL DATA:  Preoperative respiratory exam for right-sided hip fracture. EXAM: CHEST 1 VIEW COMPARISON:  07/03/2013 FINDINGS: The heart size and mediastinal contours are within normal limits. There is no evidence of pulmonary edema, consolidation, pneumothorax, nodule or pleural fluid. The visualized skeletal structures are unremarkable. IMPRESSION: No active disease. Electronically Signed   By: Aletta Edouard M.D.   On: 10/01/2016 09:18   Ct Head Wo Contrast  Result Date: 10/01/2016 CLINICAL DATA:  Patient fell several days ago and has been "scooting" around since that time. The patient is unable to get up and walk. EXAM: CT HEAD WITHOUT CONTRAST CT CERVICAL SPINE WITHOUT CONTRAST TECHNIQUE: Multidetector CT imaging of the head and cervical spine was performed following the standard protocol without intravenous contrast. Multiplanar CT image reconstructions of the cervical spine were also generated. COMPARISON:  MRI of the cervical spine February 10, 2014 FINDINGS: CT HEAD FINDINGS Brain: No subdural, epidural, or subarachnoid hemorrhage. Cerebellum, brainstem, and basal cisterns are within normal limits. No mass, mass effect, or midline shift. The ventricles and sulci are prominent, likely age related. Moderate white matter changes are seen. No acute cortical ischemia or infarct. Vascular: Calcified atherosclerosis in the intracranial carotid arteries. Skull: Normal. Negative for fracture or  focal lesion. Sinuses/Orbits: No acute finding. Other: No other abnormalities. CT CERVICAL SPINE FINDINGS Alignment: There is straightening of normal lordosis. Anterior plate and screws are seen from C3 through C7. Hardware is intact. No traumatic malalignment. Skull base and vertebrae: Evaluation of C3 through C7 is limited due to streak artifact off of surgical hardware. The hardware is intact with no evidence of failure. No fractures are seen through these levels. Mild anterior wedging of T1 is stable since comparison MRI. Mild anterior wedging of T2 is also stable. No fractures are seen through the levels of C1 or C2 either. The alignment at C2-3 is due to mild flexion during the study. No prevertebral soft tissue swelling identified. The pre odontoid space is normal. Soft tissues and spinal canal: No prevertebral fluid or swelling. No visible canal hematoma. Disc levels:  Facet degenerative changes. Upper chest: Emphysematous changes in the lung apices. Other: No other abnormalities. IMPRESSION: 1. No acute intracranial abnormality. 2. Postsurgical changes in the cervical spine with no fracture or traumatic malalignment. Electronically Signed   By: Dorise Bullion III M.D  On: 10/01/2016 09:21   Ct Cervical Spine Wo Contrast  Result Date: 10/01/2016 CLINICAL DATA:  Patient fell several days ago and has been "scooting" around since that time. The patient is unable to get up and walk. EXAM: CT HEAD WITHOUT CONTRAST CT CERVICAL SPINE WITHOUT CONTRAST TECHNIQUE: Multidetector CT imaging of the head and cervical spine was performed following the standard protocol without intravenous contrast. Multiplanar CT image reconstructions of the cervical spine were also generated. COMPARISON:  MRI of the cervical spine February 10, 2014 FINDINGS: CT HEAD FINDINGS Brain: No subdural, epidural, or subarachnoid hemorrhage. Cerebellum, brainstem, and basal cisterns are within normal limits. No mass, mass effect, or midline  shift. The ventricles and sulci are prominent, likely age related. Moderate white matter changes are seen. No acute cortical ischemia or infarct. Vascular: Calcified atherosclerosis in the intracranial carotid arteries. Skull: Normal. Negative for fracture or focal lesion. Sinuses/Orbits: No acute finding. Other: No other abnormalities. CT CERVICAL SPINE FINDINGS Alignment: There is straightening of normal lordosis. Anterior plate and screws are seen from C3 through C7. Hardware is intact. No traumatic malalignment. Skull base and vertebrae: Evaluation of C3 through C7 is limited due to streak artifact off of surgical hardware. The hardware is intact with no evidence of failure. No fractures are seen through these levels. Mild anterior wedging of T1 is stable since comparison MRI. Mild anterior wedging of T2 is also stable. No fractures are seen through the levels of C1 or C2 either. The alignment at C2-3 is due to mild flexion during the study. No prevertebral soft tissue swelling identified. The pre odontoid space is normal. Soft tissues and spinal canal: No prevertebral fluid or swelling. No visible canal hematoma. Disc levels:  Facet degenerative changes. Upper chest: Emphysematous changes in the lung apices. Other: No other abnormalities. IMPRESSION: 1. No acute intracranial abnormality. 2. Postsurgical changes in the cervical spine with no fracture or traumatic malalignment. Electronically Signed   By: Dorise Bullion III M.D   On: 10/01/2016 09:21   Dg Hip Unilat W Or Wo Pelvis 2-3 Views Right  Result Date: 10/01/2016 CLINICAL DATA:  Fall EXAM: DG HIP (WITH OR WITHOUT PELVIS) 2-3V RIGHT COMPARISON:  None FINDINGS: There is an acute and comminuted intertrochanteric fracture involving the proximal right femur. Medial angulation of the distal fracture fragments noted. No dislocation. IMPRESSION: 1. Acute and comminuted intertrochanteric fracture of the proximal right femur. Electronically Signed   By:  Kerby Moors M.D.   On: 10/01/2016 09:18    Positive ROS: All other systems have been reviewed and were otherwise negative with the exception of those mentioned in the HPI and as above.  Physical Exam: General:  Alert, no acute distress Psychiatric:  Patient is competent for consent with normal mood and affect   Cardiovascular:  No pedal edema Respiratory:  No wheezing, non-labored breathing GI:  Abdomen is soft and non-tender Skin:  No lesions in the area of chief complaint Neurologic:  Sensation intact distally Lymphatic:  No axillary or cervical lymphadenopathy  Orthopedic Exam:  Orthopedic examination is limited to the right hip and lower extremity. Skin inspection around the right hip is notable for some mild ecchymosis, but otherwise is unremarkable. There is no erythema, rashes, abrasions, or lacerations. The right lower extremity is noted to be in a slightly shortened and externally rotated position as compared to the right. She has mild tenderness to palpation over the lateral aspect of her right hip. There is more moderate pain with any attempted active  or passive motion of the hip. She is neurovascularly intact to the right lower extremity and foot as she is able to actively dorsiflex and plantarflex her toes and ankle. Sensation is intact to light touch. She has good capillary refill to her right foot.  X-rays:  X-rays of the pelvis and right hip are available for review. These films demonstrate a displaced comminuted intertrochanteric fracture of the right hip. The hip joint itself appears to be well-maintained and with only minimal degenerative changes. No lytic lesions are identified.  Assessment: Closed displaced intertrochanteric fracture right hip.  Plan: The treatment options are discussed with the patient, including both surgical and nonsurgical choices. The patient would like to proceed with surgical intervention to include reduction and stabilization of the fracture  with an intramedullary nail device. This procedure has been discussed in detail with the patient has had the potential risks (including bleeding, infection, nerve and/or blood vessel injury, persistent or recurrent pain, malunion and/or nonunion, need for further surgery, blood clots, strokes, heart attacks and/or arrhythmias, etc.) and benefits. The patient states her understanding and wishes to proceed. A formal written consent will be obtained by the nursing staff.  Thank you for ask me to participate in the care of this pleasant yet unfortunate woman. I will be happy to follow her with you.   Pascal Lux, MD  Beeper #:  [phone]  10/01/2016 12:05 PM

## 2016-10-01 NOTE — Anesthesia Procedure Notes (Addendum)
Procedure Name: Intubation Performed by: Sinda Du Pre-anesthesia Checklist: Patient identified, Patient being monitored, Timeout performed, Emergency Drugs available and Suction available Patient Re-evaluated:Patient Re-evaluated prior to inductionOxygen Delivery Method: Circle system utilized Preoxygenation: Pre-oxygenation with 100% oxygen Intubation Type: IV induction Ventilation: Mask ventilation without difficulty Laryngoscope Size: 3 and Glidescope Grade View: Grade II Tube type: Oral Tube size: 7.0 mm Number of attempts: 2 Airway Equipment and Method: Rigid stylet and Video-laryngoscopy Placement Confirmation: ETT inserted through vocal cords under direct vision,  positive ETCO2 and breath sounds checked- equal and bilateral Secured at: 19 cm Tube secured with: Tape Dental Injury: Teeth and Oropharynx as per pre-operative assessment  Difficulty Due To: Difficulty was anticipated and Difficult Airway- due to anterior larynx Future Recommendations: Recommend- induction with short-acting agent, and alternative techniques readily available

## 2016-10-01 NOTE — Progress Notes (Signed)
MEDICATION RELATED CONSULT NOTE - INITIAL   Pharmacy Consult for Electrolyte Monitoring Indication: Hypokalemia  No Known Allergies  Patient Measurements: Height: 5\' 5"  (165.1 cm) Weight: 115 lb (52.2 kg) IBW/kg (Calculated) : 57   Vital Signs: Temp: 98.2 F (36.8 C) (11/12 0803) Temp Source: Oral (11/12 0803) BP: 140/74 (11/12 1034) Pulse Rate: 106 (11/12 1034) Intake/Output from previous day: No intake/output data recorded. Intake/Output from this shift: No intake/output data recorded.  Labs:  Recent Labs  10/01/16 0825  WBC 11.3*  HGB 13.8  HCT 41.4  PLT 368  CREATININE 0.94  MG 1.6*  ALBUMIN 3.4*  PROT 7.1  AST 30  ALT 25  ALKPHOS 67  BILITOT 0.8   Estimated Creatinine Clearance: 46.5 mL/min (by C-G formula based on SCr of 0.94 mg/dL).   Microbiology: No results found for this or any previous visit (from the past 720 hour(s)).  Medical History: Past Medical History:  Diagnosis Date  . Anxiety   . CAD (coronary artery disease)   . Carpal tunnel syndrome   . Complication of anesthesia    has woken up at times  . COPD (chronic obstructive pulmonary disease) ([place])   . CRP elevated 09/14/2015  . Depression   . Dyslipidemia   . Dysrhythmia    hx palpatations  . Elevated sedimentation rate 09/14/2015  . Esophageal spasm   . Gastrointestinal parasites   . Hiatal hernia   . History of peptic ulcer disease   . Hyperthyroidism   . Hypothyroidism   . Low magnesium levels 09/14/2015  . Pelvic fracture ([place]) 2008   fall from riding a horse  . Reflux   . Rotator cuff injury     Assessment: 70 y/o F with a h/o CAD and COPD admitted with hip fracture, hypokalemia. Pharmacy consulted to assist in managing electrolytes.   Plan:  Magnesium sulfate 2 g iv once. Potassium choride 40 meq po given x 1 and iv replacement ordered providing 10 meq/hr. Will f/u K at 1800.   Ulice Dash D 10/01/2016,10:57 AM

## 2016-10-01 NOTE — Clinical Social Work Note (Signed)
CSW received a consult for possible SNF placement. CSW is following pending PT/OT recommendations.  Santiago Bumpers, MSW, LCSW-A [phone]

## 2016-10-01 NOTE — ED Provider Notes (Addendum)
[facility] Emergency Department Provider Note    First MD Initiated Contact with Patient 10/01/16 [phone]     (approximate)  I have reviewed the triage vital signs and the nursing notes.   HISTORY  Chief Complaint Fall and Hip Pain  Level V Caveat:  Acute encephalopathy - toxic (xanax/oxycodone)    HPI [name] is a 70 y.o. female reportedly of from home presents with report of fall from standing onto concrete on Tuesday. Patient was at home alone and has been on the floor scooting around since then. Patient states that people have come to check on her but she has refused help. She is complaining of pain to her right hip with an obvious deformity.  Patient's son came to the bedside to corroborate this story patient was reportedly down since Tuesday. Has a history of significant substance abuse with high dose narcotics prescribed at home. Also has a history of COPD.  Past Medical History:  Diagnosis Date  . Anxiety   . CAD (coronary artery disease)   . Carpal tunnel syndrome   . Complication of anesthesia    has woken up at times  . COPD (chronic obstructive pulmonary disease) ([place])   . CRP elevated 09/14/2015  . Depression   . Dyslipidemia   . Dysrhythmia    hx palpatations  . Elevated sedimentation rate 09/14/2015  . Esophageal spasm   . Gastrointestinal parasites   . Hiatal hernia   . History of peptic ulcer disease   . Hyperthyroidism   . Hypothyroidism   . Low magnesium levels 09/14/2015  . Pelvic fracture ([place]) 2008   fall from riding a horse  . Reflux   . Rotator cuff injury    Family History  Problem Relation Age of Onset  . Aneurysm     Past Surgical History:  Procedure Laterality Date  . CARPAL TUNNEL RELEASE    . CESAREAN SECTION    . CHOLECYSTECTOMY    . NECK SURGERY    . ROTATOR CUFF REPAIR     x2  . SHOULDER ARTHROSCOPY  12/07/2011   Procedure: ARTHROSCOPY SHOULDER;  Surgeon: Ninetta Lights, MD;  Location: [place];  Service: Orthopedics;  Laterality: Right;  Debridement Partial Cuff Tear, Release Coracoacromial Ligament  . SHOULDER SURGERY  12/07/2011   right   Patient Active Problem List   Diagnosis Date Noted  . Hip fracture ([place]) 10/01/2016  . Cervical facet hypertrophy (Bilateral) 05/27/2016  . History of shoulder surgery 5 (Right) 05/27/2016  . Lumbar foraminal stenosis (L3-4) (Left) 05/27/2016  . 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 Lumbar facet hypertrophy (Bilateral) 05/27/2016  . Lumbar facet syndrome (Location of Primary Source of Pain) (Bilateral) (R>L) 05/27/2016  . Lumbar grade 1 Anterolisthesis of L3 over L4 05/27/2016  . Chronic shoulder pain (Bilateral) (status post multiple surgeries) (R>L) 12/08/2015  . Substance use disorder Risk: High 09/14/2015  . Chronic pain syndrome 09/14/2015  . Cervical spondylosis 09/14/2015  . Chronic neck pain (Location of Secondary source of pain) (Bilateral) (R>L) 09/14/2015  . 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 Cervical facet syndrome (Location of Secondary source of pain) (Bilateral) (R>L) 09/14/2015  . Cervical myofascial pain syndrome 09/14/2015  . Chronic low back pain (Location of Primary Source of Pain) (Bilateral) (R>L) 09/14/2015  . Lumbar spondylosis 09/14/2015  . Chronic shoulder impingement syndrome (Right) 09/14/2015  . Low magnesium levels 09/14/2015  .  CRP elevated 09/14/2015  . Elevated sedimentation rate 09/14/2015  . Chronic obstructive pulmonary disease (COPD) ([place]) 09/14/2015  . Nicotine dependence 09/14/2015  . Chronic shoulder pain (Right) 09/14/2015  . Abnormal nerve conduction studies 09/14/2015  . Encounter for therapeutic drug level monitoring 09/09/2015  . Long term current use of opiate analgesic 09/09/2015  . Long term prescription opiate use 09/09/2015  . Uncomplicated opioid dependence ([place]) 09/09/2015  . Opiate use  09/09/2015  . Chronic pain 09/09/2015  . Anxiety and depression 07/11/2012  . Smoking 05/27/2011  . Fatigue 05/27/2011  . HYPOTHYROIDISM 11/25/2010  . HYPERLIPIDEMIA-MIXED 08/24/2010  . Tachycardia 08/24/2010  . DYSPNEA 08/24/2010      Prior to Admission medications   Medication Sig Start Date End Date Taking? Authorizing Provider  albuterol-ipratropium (COMBIVENT) 18-103 MCG/ACT inhaler Inhale 2 puffs into the lungs every 6 (six) hours as needed.   Yes Historical Provider, MD  ALPRAZolam Duanne Moron) 1 MG tablet Take 1 mg by mouth 4 (four) times daily as needed.    Yes Historical Provider, MD  atorvastatin (LIPITOR) 40 MG tablet Take 40 mg by mouth daily.   Yes Historical Provider, MD  buPROPion (WELLBUTRIN SR) 150 MG 12 hr tablet Take 150 mg by mouth 2 (two) times daily.   Yes Historical Provider, MD  Cholecalciferol (VITAMIN D3) 5000 UNITS TABS Take 1 tablet by mouth daily.   Yes Historical Provider, MD  escitalopram (LEXAPRO) 10 MG tablet Take 10 mg by mouth daily.  10/15/13  Yes Historical Provider, MD  Fluticasone-Salmeterol (ADVAIR) 100-50 MCG/DOSE AEPB Inhale 1 puff into the lungs every 12 (twelve) hours.   Yes Historical Provider, MD  levothyroxine (SYNTHROID, LEVOTHROID) 88 MCG tablet Take 88 mcg by mouth daily.     Yes Historical Provider, MD  magnesium oxide (MAG-OX) 400 MG tablet Take 400 mg by mouth 2 (two) times daily.   Yes Historical Provider, MD  morphine (MS CONTIN) 30 MG 12 hr tablet Take 1 tablet (30 mg total) by mouth every 12 (twelve) hours. 09/30/16 10/30/16 Yes Milinda Pointer, MD  tizanidine (ZANAFLEX) 2 MG capsule Take 1 capsule (2 mg total) by mouth 3 (three) times daily as needed for muscle spasms. 08/31/16 11/29/16 Yes Milinda Pointer, MD  traZODone (DESYREL) 150 MG tablet Take 150 mg by mouth at bedtime.     Yes Historical Provider, MD    Allergies Patient has no known allergies.    Social History Social History  Substance Use Topics  . Smoking  status: Current Every Day Smoker    Packs/day: 0.50    Types: Cigarettes  . Smokeless tobacco: Never Used  . Alcohol use No    Review of Systems Patient denies headaches, rhinorrhea, blurry vision, numbness, shortness of breath, chest pain, edema, cough, abdominal pain, nausea, vomiting, diarrhea, dysuria, fevers, rashes or hallucinations unless otherwise stated above in HPI. ____________________________________________   PHYSICAL EXAM:  VITAL SIGNS: Vitals:   10/01/16 1034 10/01/16 1141  BP: 140/74 (!) 151/75  Pulse: (!) 106 (!) 118  Resp: 16   Temp:      Constitutional: Acutely ill-appearing female Eyes: Conjunctivae are normal. PERRL. EOMI. Head: Atraumatic. Nose: No congestion/rhinnorhea. Mouth/Throat: Mucous membranes are moist.  Oropharynx non-erythematous. Neck: No stridor. Mid C-spine tenderness to palpation without step-offs or deformities  Hematological/Lymphatic/Immunilogical: No cervical lymphadenopathy. Cardiovascular: Normal rate, regular rhythm. Grossly normal heart sounds.  Good peripheral circulation. Respiratory: Normal respiratory effort.  No retractions. Lungs with diffuse expiratory wheezing Gastrointestinal: Soft and nontender. No distention. No abdominal bruits. No  CVA tenderness.  Musculoskeletal: Right leg is externally rotated and shortened with pain with wall roll of the right hip. Pulses are palpable distally. Compartments are soft. She has a large stage III left parasacral cutis ulcer. Neurologic:  Normal speech and language. No facial droop Skin:  Skin is warm, dry   ____________________________________________   LABS (all labs ordered are listed, but only abnormal results are displayed)  Results for orders placed or performed during the hospital encounter of 10/01/16 (from the past 24 hour(s))  CBC with Differential/Platelet     Status: Abnormal   Collection Time: 10/01/16  8:25 AM  Result Value Ref Range   WBC 11.3 (H) 3.6 - 11.0 K/uL    RBC 4.49 3.80 - 5.20 MIL/uL   Hemoglobin 13.8 12.0 - 16.0 g/dL   HCT 41.4 35.0 - 47.0 %   MCV 92.2 80.0 - 100.0 fL   MCH 30.6 26.0 - 34.0 pg   MCHC 33.2 32.0 - 36.0 g/dL   RDW 13.6 11.5 - 14.5 %   Platelets 368 150 - 440 K/uL   Neutrophils Relative % 74 %   Neutro Abs 8.4 (H) 1.4 - 6.5 K/uL   Lymphocytes Relative 17 %   Lymphs Abs 1.9 1.0 - 3.6 K/uL   Monocytes Relative 7 %   Monocytes Absolute 0.8 0.2 - 0.9 K/uL   Eosinophils Relative 1 %   Eosinophils Absolute 0.1 0 - 0.7 K/uL   Basophils Relative 1 %   Basophils Absolute 0.1 0 - 0.1 K/uL  Comprehensive metabolic panel     Status: Abnormal   Collection Time: 10/01/16  8:25 AM  Result Value Ref Range   Sodium 135 135 - 145 mmol/L   Potassium 2.7 (LL) 3.5 - 5.1 mmol/L   Chloride 100 (L) 101 - 111 mmol/L   CO2 27 22 - 32 mmol/L   Glucose, Bld 114 (H) 65 - 99 mg/dL   BUN 18 6 - 20 mg/dL   Creatinine, Ser 0.94 0.44 - 1.00 mg/dL   Calcium 8.7 (L) 8.9 - 10.3 mg/dL   Total Protein 7.1 6.5 - 8.1 g/dL   Albumin 3.4 (L) 3.5 - 5.0 g/dL   AST 30 15 - 41 U/L   ALT 25 14 - 54 U/L   Alkaline Phosphatase 67 38 - 126 U/L   Total Bilirubin 0.8 0.3 - 1.2 mg/dL   GFR calc non Af Amer >60 >60 mL/min   GFR calc Af Amer >60 >60 mL/min   Anion gap 8 5 - 15  Type and screen [facility]     Status: None   Collection Time: 10/01/16  8:25 AM  Result Value Ref Range   ABO/RH(D) O POS    Antibody Screen NEG    Sample Expiration 10/04/2016   CK     Status: Abnormal   Collection Time: 10/01/16  8:25 AM  Result Value Ref Range   Total CK 372 (H) 38 - 234 U/L  Troponin I     Status: None   Collection Time: 10/01/16  8:25 AM  Result Value Ref Range   Troponin I <0.03 <0.03 ng/mL  Protime-INR     Status: None   Collection Time: 10/01/16  8:25 AM  Result Value Ref Range   Prothrombin Time 14.0 11.4 - 15.2 seconds   INR 1.08   Magnesium     Status: Abnormal   Collection Time: 10/01/16  8:25 AM  Result Value Ref Range    Magnesium 1.6 (L)  1.7 - 2.4 mg/dL  Urinalysis complete, with microscopic ([facility] only)     Status: Abnormal   Collection Time: 10/01/16 10:10 AM  Result Value Ref Range   Color, Urine AMBER (A) YELLOW   APPearance CLEAR (A) CLEAR   Glucose, UA NEGATIVE NEGATIVE mg/dL   Bilirubin Urine NEGATIVE NEGATIVE   Ketones, ur NEGATIVE NEGATIVE mg/dL   Specific Gravity, Urine 1.024 1.005 - 1.030   Hgb urine dipstick NEGATIVE NEGATIVE   pH 5.0 5.0 - 8.0   Protein, ur 30 (A) NEGATIVE mg/dL   Nitrite NEGATIVE NEGATIVE   Leukocytes, UA NEGATIVE NEGATIVE   RBC / HPF 0-5 0 - 5 RBC/hpf   WBC, UA 0-5 0 - 5 WBC/hpf   Bacteria, UA NONE SEEN NONE SEEN   Squamous Epithelial / LPF NONE SEEN NONE SEEN   Mucous PRESENT    Hyaline Casts, UA PRESENT   Urine Drug Screen, Qualitative ([facility] only)     Status: Abnormal   Collection Time: 10/01/16 10:10 AM  Result Value Ref Range   Tricyclic, Ur Screen NONE DETECTED NONE DETECTED   Amphetamines, Ur Screen NONE DETECTED NONE DETECTED   MDMA (Ecstasy)Ur Screen NONE DETECTED NONE DETECTED   Cocaine Metabolite,Ur [place] NONE DETECTED NONE DETECTED   Opiate, Ur Screen POSITIVE (A) NONE DETECTED   Phencyclidine (PCP) Ur S NONE DETECTED NONE DETECTED   Cannabinoid 50 Ng, Ur [place] NONE DETECTED NONE DETECTED   Barbiturates, Ur Screen NONE DETECTED NONE DETECTED   Benzodiazepine, Ur Scrn POSITIVE (A) NONE DETECTED   Methadone Scn, Ur NONE DETECTED NONE DETECTED  Potassium     Status: Abnormal   Collection Time: 10/01/16  1:13 PM  Result Value Ref Range   Potassium 3.1 (L) 3.5 - 5.1 mmol/L   ____________________________________________  EKG My review and personal interpretation at Time: 8:30   Indication: tachycardia  Rate: 115  Rhythm: sinus Axis: normal Other: non specific ST and T wave changes, no acute ischemia ____________________________________________  RADIOLOGY  I personally reviewed all radiographic images ordered to evaluate for the above acute complaints and  reviewed radiology reports and findings.  These findings were personally discussed with the patient.  Please see medical record for radiology report.  ____________________________________________   PROCEDURES  Procedure(s) performed: none Procedures    Critical Care performed: no ____________________________________________   INITIAL IMPRESSION / ASSESSMENT AND PLAN / ED COURSE  Pertinent labs & imaging results that were available during my care of the patient were reviewed by me and considered in my medical decision making (see chart for details).  DDX: Dehydration, fracture, sepsis, pna, uti, hypoglycemia, cva, drug effect, withdrawal, encephalitis   [name] is a 70 y.o. who presents to the ED with evidence of right hip deformity and being left down ground since Tuesday. She is afebrile and hemodynamically stable. Does have evidence of decubitus ulcers confirming her duration of being found down. Patient has good distal pulses and sensation. Clinically she appears to have right hip fracture. No evidence of other associated traumatic injury.  Clinical Course as of Oct 02 1403  Nancy Fetter Oct 01, 2016  [id number] Right hip with intertrochanteric fracture.  [PR]    Clinical Course User Index [PR] Merlyn Lot, MD   Blood work with evidence of acute hyperkalemia mildly elevated CK. Patient receiving IV fluids as well as potassium repletion. Patient will require resuscitation prior to operative management. Or so and hospital 7 consult for admission and further evaluation and management.  Have discussed with the patient  and available family all diagnostics and treatments performed thus far and all questions were answered to the best of my ability. The patient demonstrates understanding and agreement with plan.   ____________________________________________   FINAL CLINICAL IMPRESSION(S) / ED DIAGNOSES  Final diagnoses:  Closed displaced intertrochanteric fracture of right femur,  initial encounter ([place])  Fall, initial encounter  Leukocytosis, unspecified type  Hypokalemia      NEW MEDICATIONS STARTED DURING THIS VISIT:  Current Discharge Medication List       Note:  This document was prepared using Dragon voice recognition software and may include unintentional dictation errors.    Merlyn Lot, MD 10/01/16 [place], MD 10/11/16 [phone]

## 2016-10-01 NOTE — ED Triage Notes (Signed)
Pt came to ED via EMS. Pt fell Tuesday, reports has been scooting around since, unable to get up and walk. Pt alert and oriented. C/o right hip pain, shortening and rotation noted.

## 2016-10-01 NOTE — Care Management Important Message (Signed)
Important Message  Patient Details  Name: [name] MRN: [id number] Date of Birth: Jul 15, 1946   Medicare Important Message Given:  Yes    Sorayah Schrodt A, RN 10/01/2016, 2:54 PM

## 2016-10-01 NOTE — ED Notes (Signed)
Pt has obvious odor. On floor since Tuesday after fall. Pt given bath, cleaned, changed, gown applied. Bed sore noted to left buttocks. MD aware.

## 2016-10-02 ENCOUNTER — Encounter: Payer: Self-pay | Admitting: Surgery

## 2016-10-02 DIAGNOSIS — S72141A Displaced intertrochanteric fracture of right femur, initial encounter for closed fracture: Secondary | ICD-10-CM | POA: Insufficient documentation

## 2016-10-02 DIAGNOSIS — L8990 Pressure ulcer of unspecified site, unspecified stage: Secondary | ICD-10-CM | POA: Insufficient documentation

## 2016-10-02 LAB — BASIC METABOLIC PANEL
Anion gap: 4 — ABNORMAL LOW (ref 5–15)
BUN: 14 mg/dL (ref 6–20)
CHLORIDE: 107 mmol/L (ref 101–111)
CO2: 24 mmol/L (ref 22–32)
CREATININE: 0.67 mg/dL (ref 0.44–1.00)
Calcium: 7.7 mg/dL — ABNORMAL LOW (ref 8.9–10.3)
GFR calc Af Amer: >60 mL/min (ref >60)
GFR calc non Af Amer: >60 mL/min (ref >60)
Glucose, Bld: 176 mg/dL — ABNORMAL HIGH (ref 65–99)
POTASSIUM: 4.9 mmol/L (ref 3.5–5.1)
SODIUM: 135 mmol/L (ref 135–145)

## 2016-10-02 LAB — CBC
HEMATOCRIT: 31.7 % — AB (ref 35.0–47.0)
Hemoglobin: 10.5 g/dL — ABNORMAL LOW (ref 12.0–16.0)
MCH: 30.6 pg (ref 26.0–34.0)
MCHC: 33.1 g/dL (ref 32.0–36.0)
MCV: 92.2 fL (ref 80.0–100.0)
PLATELETS: 255 10*3/uL (ref 150–440)
RBC: 3.43 MIL/uL — ABNORMAL LOW (ref 3.80–5.20)
RDW: 13.3 % (ref 11.5–14.5)
WBC: 8 10*3/uL (ref 3.6–11.0)

## 2016-10-02 LAB — MAGNESIUM: Magnesium: 2 mg/dL (ref 1.7–2.4)

## 2016-10-02 LAB — PHOSPHORUS: Phosphorus: 2.9 mg/dL (ref 2.5–4.6)

## 2016-10-02 MED ORDER — COLLAGENASE 250 UNIT/GM EX OINT
TOPICAL_OINTMENT | Freq: Every day | CUTANEOUS | Status: DC
  Administered 2016-10-02 – 2016-10-04 (×3): via TOPICAL
  Filled 2016-10-02: qty 30

## 2016-10-02 MED ORDER — OXYCODONE HCL 5 MG PO TABS
5.0000 mg | ORAL_TABLET | ORAL | Status: DC | PRN
  Administered 2016-10-02 – 2016-10-04 (×7): 10 mg via ORAL
  Filled 2016-10-02 (×7): qty 2

## 2016-10-02 MED ORDER — NICOTINE 21 MG/24HR TD PT24
21.0000 mg | MEDICATED_PATCH | Freq: Every day | TRANSDERMAL | Status: DC
  Administered 2016-10-02: 21 mg via TRANSDERMAL
  Filled 2016-10-02: qty 1

## 2016-10-02 MED ORDER — NICOTINE 14 MG/24HR TD PT24
14.0000 mg | MEDICATED_PATCH | Freq: Every day | TRANSDERMAL | Status: DC
  Administered 2016-10-03 – 2016-10-04 (×2): 14 mg via TRANSDERMAL
  Filled 2016-10-02 (×2): qty 1

## 2016-10-02 MED ORDER — IPRATROPIUM-ALBUTEROL 0.5-2.5 (3) MG/3ML IN SOLN
3.0000 mL | Freq: Three times a day (TID) | RESPIRATORY_TRACT | Status: DC
  Administered 2016-10-02 – 2016-10-04 (×5): 3 mL via RESPIRATORY_TRACT
  Filled 2016-10-02 (×5): qty 3

## 2016-10-02 MED ORDER — PNEUMOCOCCAL VAC POLYVALENT 25 MCG/0.5ML IJ INJ: 0.5000 mL | INJECTION | INTRAMUSCULAR | Status: DC | PRN

## 2016-10-02 NOTE — NC FL2 (Signed)
[place] LEVEL OF CARE SCREENING TOOL     IDENTIFICATION  Patient Name: [name] Birthdate: 1946-10-01 Sex: female Admission Date (Current Location): 10/01/2016  [place] and [place] Number:  Engineering geologist and Address:  [facility], [address] [place],  [address]      Provider Number: [id number]  Attending Physician Name and Address:  Hillary Bow, MD  Relative Name and Phone Number:       Current Level of Care: Hospital Recommended Level of Care: [place] Prior Approval Number:    Date Approved/Denied:   PASRR Number:  ([id number])  Discharge Plan: SNF    Current Diagnoses: Patient Active Problem List   Diagnosis Date Noted  . Hip fracture ([place]) 10/01/2016  . Cervical facet hypertrophy (Bilateral) 05/27/2016  . History of shoulder surgery 5 (Right) 05/27/2016  . Lumbar foraminal stenosis (L3-4) (Left) 05/27/2016  . 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 Lumbar facet hypertrophy (Bilateral) 05/27/2016  . Lumbar facet syndrome (Location of Primary Source of Pain) (Bilateral) (R>L) 05/27/2016  . Lumbar grade 1 Anterolisthesis of L3 over L4 05/27/2016  . Chronic shoulder pain (Bilateral) (status post multiple surgeries) (R>L) 12/08/2015  . Substance use disorder Risk: High 09/14/2015  . Chronic pain syndrome 09/14/2015  . Cervical spondylosis 09/14/2015  . Chronic neck pain (Location of Secondary source of pain) (Bilateral) (R>L) 09/14/2015  . 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 Cervical facet syndrome (Location of Secondary source of pain) (Bilateral) (R>L) 09/14/2015  . Cervical myofascial pain syndrome 09/14/2015  . Chronic low back pain (Location of Primary Source of Pain) (Bilateral) (R>L) 09/14/2015  . Lumbar spondylosis 09/14/2015  . Chronic shoulder impingement syndrome (Right) 09/14/2015  . Low magnesium  levels 09/14/2015  . CRP elevated 09/14/2015  . Elevated sedimentation rate 09/14/2015  . Chronic obstructive pulmonary disease (COPD) ([place]) 09/14/2015  . Nicotine dependence 09/14/2015  . Chronic shoulder pain (Right) 09/14/2015  . Abnormal nerve conduction studies 09/14/2015  . Encounter for therapeutic drug level monitoring 09/09/2015  . Long term current use of opiate analgesic 09/09/2015  . Long term prescription opiate use 09/09/2015  . Uncomplicated opioid dependence ([place]) 09/09/2015  . Opiate use 09/09/2015  . Chronic pain 09/09/2015  . Anxiety and depression 07/11/2012  . Smoking 05/27/2011  . Fatigue 05/27/2011  . HYPOTHYROIDISM 11/25/2010  . HYPERLIPIDEMIA-MIXED 08/24/2010  . Tachycardia 08/24/2010  . DYSPNEA 08/24/2010    Orientation RESPIRATION BLADDER Height & Weight     Self, Time, Situation, Place  O2 (2 Liters Oxygen ) Incontinent, Indwelling catheter Weight: 115 lb (52.2 kg) Height:  5\' 4"  (162.6 cm)  BEHAVIORAL SYMPTOMS/MOOD NEUROLOGICAL BOWEL NUTRITION STATUS   (none)  (none) Continent Diet (Regular Diet )  AMBULATORY STATUS COMMUNICATION OF NEEDS Skin   Extensive Assist Verbally Surgical wounds, PU Stage and Appropriate Care (Unstagable Pressure Ulcer on Buttocks. )                       Personal Care Assistance Level of Assistance  Bathing, Feeding, Dressing Bathing Assistance: Limited assistance Feeding assistance: Independent Dressing Assistance: Limited assistance     Functional Limitations Info  Sight, Hearing, Speech Sight Info: Adequate Hearing Info: Adequate Speech Info: Adequate    SPECIAL CARE FACTORS FREQUENCY  PT (By licensed PT), OT (By licensed OT)     PT Frequency:  (5) OT Frequency:  (5)  Contractures      Additional Factors Info  Code Status, Allergies Code Status Info:  (Full Code. ) Allergies Info:  (No Known Allergies. )           Current Medications (10/02/2016):  This is the current hospital  active medication list Current Facility-Administered Medications  Medication Dose Route Frequency Provider Last Rate Last Dose  . acetaminophen (TYLENOL) tablet 650 mg  650 mg Oral Q6H PRN Corky Mull, MD       Or  . acetaminophen (TYLENOL) suppository 650 mg  650 mg Rectal Q6H PRN Corky Mull, MD      . acetaminophen (TYLENOL) tablet 1,000 mg  1,000 mg Oral Q6H Corky Mull, MD   1,000 mg at 10/02/16 0530  . ALPRAZolam Duanne Moron) tablet 1 mg  1 mg Oral QID PRN Fritzi Mandes, MD   1 mg at 10/02/16 0834  . alum & mag hydroxide-simeth (MAALOX/MYLANTA) 200-200-20 MG/5ML suspension 30 mL  30 mL Oral Q4H PRN Fritzi Mandes, MD   30 mL at 10/01/16 1213  . atorvastatin (LIPITOR) tablet 40 mg  40 mg Oral Daily Fritzi Mandes, MD   40 mg at 10/02/16 0834  . bisacodyl (DULCOLAX) suppository 10 mg  10 mg Rectal Daily PRN Corky Mull, MD      . buPROPion [facility] SR) 12 hr tablet 150 mg  150 mg Oral BID Fritzi Mandes, MD   150 mg at 10/02/16 0833  . cholecalciferol (VITAMIN D) tablet 1,000 Units  1,000 Units Oral Daily Fritzi Mandes, MD   1,000 Units at 10/02/16 0834  . collagenase (SANTYL) ointment   Topical Daily Srikar Sudini, MD      . dextrose 5 % and 0.9 % NaCl with KCl 20 mEq/L infusion   Intravenous Continuous Corky Mull, MD 100 mL/hr at 10/01/16 1813    . diphenhydrAMINE (BENADRYL) 12.5 MG/5ML elixir 12.5-25 mg  12.5-25 mg Oral Q4H PRN Corky Mull, MD      . docusate sodium (COLACE) capsule 100 mg  100 mg Oral BID Corky Mull, MD   100 mg at 10/02/16 0834  . enoxaparin (LOVENOX) injection 40 mg  40 mg Subcutaneous Q24H Corky Mull, MD   40 mg at 10/02/16 0739  . escitalopram (LEXAPRO) tablet 10 mg  10 mg Oral Daily Fritzi Mandes, MD   10 mg at 10/02/16 0834  . HYDROmorphone (DILAUDID) injection 1-2 mg  1-2 mg Intravenous Q2H PRN Corky Mull, MD   2 mg at 10/02/16 0948  . ipratropium-albuterol (DUONEB) 0.5-2.5 (3) MG/3ML nebulizer solution 3 mL  3 mL Nebulization Q6H PRN Fritzi Mandes, MD   3 mL at 10/01/16 1440   . ipratropium-albuterol (DUONEB) 0.5-2.5 (3) MG/3ML nebulizer solution 3 mL  3 mL Nebulization Q6H Andria Frames, MD   3 mL at 10/02/16 0753  . ketorolac (TORADOL) 15 MG/ML injection 15 mg  15 mg Intravenous Q6H PRN Fritzi Mandes, MD      . levothyroxine (SYNTHROID, LEVOTHROID) tablet 88 mcg  88 mcg Oral Q breakfast Fritzi Mandes, MD   88 mcg at 10/02/16 0737  . magnesium hydroxide (MILK OF MAGNESIA) suspension 30 mL  30 mL Oral Daily PRN Corky Mull, MD      . magnesium oxide (MAG-OX) tablet 400 mg  400 mg Oral BID Fritzi Mandes, MD   400 mg at 10/02/16 0834  . metoCLOPramide (REGLAN) tablet 5-10 mg  5-10 mg Oral Q8H PRN Corky Mull, MD  Or  . metoCLOPramide (REGLAN) injection 5-10 mg  5-10 mg Intravenous Q8H PRN Corky Mull, MD      . mometasone-formoterol [facility]) 100-5 MCG/ACT inhaler 2 puff  2 puff Inhalation BID Fritzi Mandes, MD   2 puff at 10/02/16 0737  . morphine (MS CONTIN) 12 hr tablet 30 mg  30 mg Oral Q12H Fritzi Mandes, MD   30 mg at 10/02/16 [id number]  . nicotine (NICODERM CQ - dosed in mg/24 hours) patch 14 mg  14 mg Transdermal Daily Srikar Sudini, MD      . nicotine (NICODERM CQ - dosed in mg/24 hours) patch 21 mg  21 mg Transdermal Daily Fritzi Mandes, MD      . ondansetron (ZOFRAN) tablet 4 mg  4 mg Oral Q6H PRN Corky Mull, MD       Or  . ondansetron [facility]) injection 4 mg  4 mg Intravenous Q6H PRN Corky Mull, MD      . oxyCODONE (Oxy IR/ROXICODONE) immediate release tablet 5-10 mg  5-10 mg Oral Q3H PRN Corky Mull, MD   10 mg at 10/02/16 0738  . pantoprazole (PROTONIX) EC tablet 40 mg  40 mg Oral Daily Corky Mull, MD   40 mg at 10/02/16 0834  . pneumococcal 23 valent vaccine (PNU-IMMUNE) injection 0.5 mL  0.5 mL Intramuscular Prior to discharge Hillary Bow, MD      . senna-docusate (Senokot-S) tablet 1 tablet  1 tablet Oral QHS PRN Fritzi Mandes, MD      . sodium phosphate (FLEET) 7-19 GM/118ML enema 1 enema  1 enema Rectal Once PRN Corky Mull, MD      . tiZANidine  (ZANAFLEX) tablet 2 mg  2 mg Oral TID PRN Fritzi Mandes, MD   2 mg at 10/02/16 0521  . traZODone (DESYREL) tablet 150 mg  150 mg Oral QHS Fritzi Mandes, MD   150 mg at 10/01/16 2234     Discharge Medications: Please see discharge summary for a list of discharge medications.  Relevant Imaging Results:  Relevant Lab Results:   Additional Information  (SSN: [id number])  [place], Veronia Beets, LCSW

## 2016-10-02 NOTE — Progress Notes (Signed)
   Subjective: 1 Day Post-Op Procedure(s) (LRB): INTRAMEDULLARY (IM) NAIL INTERTROCHANTRIC (Right) Patient reports pain as moderate.   Patient is well, and has had no acute complaints or problems Denies any CP, SOB, ABD pain. We will start therapy today.    Objective: Vital signs in last 24 hours: Temp:  [97.5 F (36.4 C)-98.2 F (36.8 C)] 97.9 F (36.6 C) (11/13 0551) Pulse Rate:  [60-118] 92 (11/13 0551) Resp:  [16-18] 18 (11/13 0551) BP: (101-151)/(60-79) 135/73 (11/13 0551) SpO2:  [93 %-100 %] 100 % (11/13 0551) FiO2 (%):  [92 %] 92 % (11/12 1645) Weight:  [52.2 kg (115 lb)] 52.2 kg (115 lb) (11/12 0804)  Intake/Output from previous day: 11/12 0701 - 11/13 0700 In: 2778.3 [I.V.:2578.3; IV Piggyback:200] Out: 505 [Urine:405; Blood:100] Intake/Output this shift: No intake/output data recorded.   Recent Labs  10/01/16 0825 10/02/16 0413  HGB 13.8 10.5*    Recent Labs  10/01/16 0825 10/02/16 0413  WBC 11.3* 8.0  RBC 4.49 3.43*  HCT 41.4 31.7*  PLT 368 255    Recent Labs  10/01/16 0825 10/01/16 1313 10/02/16 0413  NA 135  --  135  K 2.7* 3.1* 4.9  CL 100*  --  107  CO2 27  --  24  BUN 18  --  14  CREATININE 0.94  --  0.67  GLUCOSE 114*  --  176*  CALCIUM 8.7*  --  7.7*    Recent Labs  10/01/16 0825  INR 1.08    EXAM General - Patient is Alert, Appropriate and Oriented Extremity - Neurovascular intact Sensation intact distally Intact pulses distally Dorsiflexion/Plantar flexion intact No cellulitis present Compartment soft Dressing - dressing C/D/I and no drainage Motor Function - intact, moving foot and toes well on exam.   Past Medical History:  Diagnosis Date  . Anxiety   . CAD (coronary artery disease)   . Carpal tunnel syndrome   . Complication of anesthesia    has woken up at times  . COPD (chronic obstructive pulmonary disease) ([place])   . CRP elevated 09/14/2015  . Depression   . Difficult intubation   . Dyslipidemia   .  Dysrhythmia    hx palpatations  . Elevated sedimentation rate 09/14/2015  . Esophageal spasm   . Gastrointestinal parasites   . Hiatal hernia   . History of peptic ulcer disease   . Hyperthyroidism   . Hypothyroidism   . Low magnesium levels 09/14/2015  . Pelvic fracture ([place]) 2008   fall from riding a horse  . Reflux   . Rotator cuff injury     Assessment/Plan:   1 Day Post-Op Procedure(s) (LRB): INTRAMEDULLARY (IM) NAIL INTERTROCHANTRIC (Right) Active Problems:   Hip fracture (HCC)  Estimated body mass index is 19.74 kg/m as calculated from the following:   Height as of this encounter: 5\' 4"  (1.626 m).   Weight as of this encounter: 52.2 kg (115 lb). Advance diet Up with therapy  Needs BM Acute post op blood loss anemia-  Hgb 10.5. Recheck labs in the morning. Care management to assist with discharge   DVT Prophylaxis - Lovenox, Foot Pumps and TED hose Weight-Bearing as tolerated to right leg   T. Rachelle Hora, PA-C [place] 10/02/2016, 8:02 AM

## 2016-10-02 NOTE — Clinical Social Work Note (Addendum)
Clinical Social Work Assessment  Patient Details  Name: [name] MRN: [id number] Date of Birth: 19-May-1946  Date of referral:  10/02/16               Reason for consult:  Facility Placement                Permission sought to share information with:  Chartered certified accountant granted to share information::  Yes, Verbal Permission Granted  Name::      [place]::   [place]   Relationship::     Contact Information:     Housing/Transportation Living arrangements for the past 2 months:  [place] of Information:  Patient Patient Interpreter Needed:  None Criminal Activity/Legal Involvement Pertinent to Current Situation/Hospitalization:  No - Comment as needed Significant Relationships:  Adult Children, Other Family Members, Parents Lives with:  Self Do you feel safe going back to the place where you live?  Yes Need for family participation in patient care:  Yes (Comment)  Care giving concerns:  Patient lives alone in [place].    Social Worker assessment / plan:  Holiday representative ([place]) received SNF consult. Patient is post op day 1 from a hip fracture. PT is recommending SNF. CSW met with patient alone at bedside to discuss D/C plan. Patient was alert and oriented and sitting up in the chair. Patient reported that she lives alone in [place] and has a little dog named Magnet. Patient reported that her mother and her son [name] are her primary supports. Per patient her mother is 80 years old. CSW explained that PT is recommending SNF. Patient reported that she has had 18 Ortho surgeries and prefers to go home. Patient reported that Dr. Percell Miller in [place] said there was nothing left to operate on in her shoulder. Patient reported that she is very familiar with PT and has weights and a treadmill at home. Patient reported that she fell at home but was able to "scoot around in her house." CSW emphasized the safety  concerns and the SNF benefits. Patient reported that she would consider SNF and is agreeable to SNF search in [facility]. CSW explained that patient's insurance [employer] will require authorization. Patient verbalized her understanding. RN case manager is aware of above.   FL2 complete and faxed out. CSW presented bed offers. Patient chose [facility]. Per [facility] admissions coordinator at [facility] she will start [place] authorization today. CSW contacted patient's mother [name] and made her aware of above. CSW attempted to contact patient's son [name] however he did not answer and a voicemail was left. CSW will continue to follow and assist as needed.      [name] called CSW back and stated that he is patient's brother and her son is [facility]. Brother is agreeable for patient to go to [facility] and will talk to patient about going.   Employment status:  Retired Nurse, adult PT Recommendations:  [place] / Referral to community resources:  [place]  Patient/Family's Response to care:  Patient prefers to go home but is considering [facility].   Patient/Family's Understanding of and Emotional Response to Diagnosis, Current Treatment, and Prognosis:  Patient was pleasant and thanked CSW for assistance.   Emotional Assessment Appearance:  Appears stated age Attitude/Demeanor/Rapport:    Affect (typically observed):  Accepting, Adaptable, Pleasant Orientation:  Oriented to Self, Oriented to Place, Oriented to  Time, Oriented to  Situation Alcohol / Substance use:  Not Applicable Psych involvement (Current and /or in the community):  No (Comment)  Discharge Needs  Concerns to be addressed:  Discharge Planning Concerns Readmission within the last 30 days:  No Current discharge risk:  Dependent with Mobility Barriers to Discharge:  Continued Medical Work up   [employer], Veronia Beets, LCSW 10/02/2016, 11:57 AM

## 2016-10-02 NOTE — Progress Notes (Signed)
MEDICATION RELATED CONSULT NOTE - Follow up  Pharmacy Consult for Electrolyte Monitoring Indication: Hypokalemia  No Known Allergies  Patient Measurements: Height: 5\' 4"  (162.6 cm) Weight: 115 lb (52.2 kg) IBW/kg (Calculated) : 54.7   Vital Signs: Temp: 97.9 F (36.6 C) (11/13 0551) Temp Source: Oral (11/13 0551) BP: 135/73 (11/13 0551) Pulse Rate: 92 (11/13 0551) Intake/Output from previous day: 11/12 0701 - 11/13 0700 In: 2778.3 [I.V.:2578.3; IV Piggyback:200] Out: 505 [Urine:405; Blood:100] Intake/Output from this shift: No intake/output data recorded.  Labs:  Recent Labs  10/01/16 0825 10/02/16 0413  WBC 11.3* 8.0  HGB 13.8 10.5*  HCT 41.4 31.7*  PLT 368 255  CREATININE 0.94 0.67  MG 1.6* 2.0  PHOS  --  2.9  ALBUMIN 3.4*  --   PROT 7.1  --   AST 30  --   ALT 25  --   ALKPHOS 67  --   BILITOT 0.8  --    Estimated Creatinine Clearance: 54.7 mL/min (by C-G formula based on SCr of 0.67 mg/dL).   Assessment: 70 y/o F with a h/o CAD and COPD admitted with hip fracture, hypokalemia. Pharmacy consulted to assist in managing electrolytes.   K 4.9, Mag 2.0, Phos 2.9 - Yesterday pt given Magnesium sulfate 2 g iv once. Potassium choride 40 meq tab po given x 1 and 20MEQ packet x 1 given, KCL 80 MEQ/1081ml IV x1 dose given.  Plan:  Electrolytes WNL today. No supplementation needed at this time. Will recheck electrolytes in the AM.  Rayna Sexton L 10/02/2016,8:50 AM

## 2016-10-02 NOTE — Progress Notes (Signed)
Initial Nutrition Assessment  DOCUMENTATION CODES:   Not applicable  INTERVENTION:  1. Magic cup TID with meals, each supplement provides 290 kcal and 9 grams of protein  NUTRITION DIAGNOSIS:   Malnutrition related to acute illness as evidenced by energy intake < or equal to 50% for > or equal to 5 days, severe depletion of muscle mass, severe depletion of body fat.  GOAL:   Patient will meet greater than or equal to 90% of their needs  MONITOR:   PO intake, I & O's, Labs, Weight trends, Supplement acceptance  REASON FOR ASSESSMENT:   Other (Comment) (Hip Fx)    ASSESSMENT:   [name]  is a 70 y.o. female who had fallen in her garage on Tuesday. Patient decided not to come here and will continue to scoot around at home with right hip pain. She was unable to bear weight however she is to call  Ms. Farinha suffered from a hip fracture after which she refused help at home from Tuesday to Sunday. Patient states she ate nothing during that time span. She is unsure of weight loss, states she doesn't have a scale, but states she loses 5# occasionally and always gains it back. Weight in chart appears to be stated, was unable to weigh patient as she was sitting in the chair at the time. She had a cheeseburger and potatoes at lunch she ate ~50% of. Meal completion documented @ 75% for breakfast. Per chart review pt has a hx of substance and drag abuse.  Nutrition-Focused physical exam completed. Findings are severe fat depletion, severe muscle depletion, and no edema.   Labs and medications reviewed: Vitamin D, Colace, Mag-Ox  Diet Order:  Diet regular Room service appropriate? Yes; Fluid consistency: Thin  Skin:  Reviewed, no issues  Last BM:  10/01/2016  Height:   Ht Readings from Last 1 Encounters:  10/01/16 5\' 4"  (1.626 m)    Weight:   Wt Readings from Last 1 Encounters:  10/01/16 115 lb (52.2 kg)    Ideal Body Weight:  54.54 kg  BMI:  Body mass index is 19.74  kg/m.  Estimated Nutritional Needs:   Kcal:  1300-1550 calories  Protein:  52-63 gm  Fluid:  >/= 1.3L  EDUCATION NEEDS:   No education needs identified at this time  Satira Anis. Cheralyn Oliver, MS, RD LDN Inpatient Clinical Dietitian Pager [phone]

## 2016-10-02 NOTE — Clinical Social Work Placement (Signed)
   CLINICAL SOCIAL WORK PLACEMENT  NOTE  Date:  10/02/2016  Patient Details  Name: [name] MRN: [id number] Date of Birth: [birth year]  Clinical Social Work is seeking post-discharge placement for this patient at the [place] level of care (*CSW will initial, date and re-position this form in  chart as items are completed):  Yes   Patient/family provided with [facility] Work Department's list of facilities offering this level of care within the geographic area requested by the patient (or if unable, by the patient's family).  Yes   Patient/family informed of their freedom to choose among providers that offer the needed level of care, that participate in Medicare, Medicaid or managed care program needed by the patient, have an available bed and are willing to accept the patient.  Yes   Patient/family informed of [facility]'s ownership interest in [facility] and [facility], as well as of the fact that they are under no obligation to receive care at these facilities.  PASRR submitted to EDS on 10/02/16     PASRR number received on 10/02/16     Existing PASRR number confirmed on       FL2 transmitted to all facilities in geographic area requested by pt/family on 10/02/16     FL2 transmitted to all facilities within larger geographic area on       Patient informed that his/her managed care company has contracts with or will negotiate with certain facilities, including the following:        Yes   Patient/family informed of bed offers received.  Patient chooses bed at  [facility] )     Physician recommends and patient chooses bed at      Patient to be transferred to   on  .  Patient to be transferred to facility by       Patient family notified on   of transfer.  Name of family member notified:        PHYSICIAN       Additional Comment:    _______________________________________________ Kemonte Ullman, Veronia Beets,  LCSW 10/02/2016, 11:55 AM

## 2016-10-02 NOTE — Evaluation (Signed)
Physical Therapy Evaluation Patient Details Name: [name] MRN: [id number] DOB: [birth year] Today's Date: 10/02/2016   History of Present Illness  Pt is a 70 y.o. female presenting to hospital s/p fall earlier in week (on Tuesday) on stairs in garage (scooted around on floor with R hip pain since).  Pt eventually agreeable to coming to hospital and found to have acute and comminuted intertrochanteric fx of proximal femur.  Pt s/p IMN R intertrochanteric 10/01/16.  Pt also found to have necrotic pressure sore L buttock.  PMH includes multiple R shoulder surgeries (x5), COPD, h/o significant substance abuse with prescription high dose narcotics, CTS s/p release, hiatal hernia, pelvic fx 2008, neck surgery.  Clinical Impression  Prior to hospital admission, pt was independent without AD.  Pt lives alone with her dog in 1 level home with stairs to enter.  Currently pt is min assist with bed mobility, transfers, and taking a few steps bed to chair with RW; limited distance/activity d/t R hip/thigh pain.  Pt would benefit from skilled PT to address noted impairments and functional limitations.  Recommend pt discharge to STR (pending pt's progress) when medically appropriate.    Follow Up Recommendations SNF    Equipment Recommendations  Rolling walker with 5" wheels    Recommendations for Other Services       Precautions / Restrictions Precautions Precautions: Fall Restrictions Weight Bearing Restrictions: Yes RLE Weight Bearing: Weight bearing as tolerated      Mobility  Bed Mobility Overal bed mobility: Needs Assistance Bed Mobility: Supine to Sit     Supine to sit: Min assist;HOB elevated     General bed mobility comments: assist for R LE; increased time and effort to perform by pt; vc's for technique; limited d/t R thigh pain  Transfers Overall transfer level: Needs assistance Equipment used: Rolling walker (2 wheeled) Transfers: Sit to/from Stand Sit to Stand: Min  assist         General transfer comment: vc's for hand and feet placement; pt standing mostly on L LE d/t R LE pain  Ambulation/Gait Ambulation/Gait assistance: Min assist;+2 safety/equipment Ambulation Distance (Feet): 3 Feet (bed to chair) Assistive device: Rolling walker (2 wheeled)   Gait velocity: decreased   General Gait Details: antalgic; step to; decreased stance time R LE; vc's for stepping pattern and walker use required; assist to navigate RW required; limited d/t R LE pain  Stairs            Wheelchair Mobility    Modified Rankin (Stroke Patients Only)       Balance Overall balance assessment: Needs assistance Sitting-balance support: Bilateral upper extremity supported;Feet supported Sitting balance-Leahy Scale: Fair     Standing balance support: Bilateral upper extremity supported (on RW) Standing balance-Leahy Scale: Fair Standing balance comment: static standing                             Pertinent Vitals/Pain Pain Assessment: 0-10 Pain Score: 8  Pain Location: R hip pain Pain Descriptors / Indicators: Sore;Sharp;Shooting;Operative site guarding;Tender Pain Intervention(s): Limited activity within patient's tolerance;Monitored during session;Premedicated before session;Repositioned;Patient requesting pain meds-RN notified;Ice applied  See flow sheet for HR and O2 vitals.    Home Living Family/patient expects to be discharged to:: Private residence Living Arrangements: Alone   Type of Home: House Home Access: Stairs to enter Entrance Stairs-Rails: Left Entrance Stairs-Number of Steps: 6 Home Layout: One level Home Equipment: None  Prior Function Level of Independence: Independent         Comments: Pt denies any falls in past 6 months.  Lives with dog.     Hand Dominance        Extremity/Trunk Assessment   Upper Extremity Assessment: Overall WFL for tasks assessed (h/o R shoulder rotator cuff issues)            Lower Extremity Assessment: RLE deficits/detail;LLE deficits/detail RLE Deficits / Details: R hip flexion at least 2+/5; R knee flexion/extension at least 2+/5; R DF at least 3+/5 LLE Deficits / Details: ROM and strength WFL  Cervical / Trunk Assessment: Normal  Communication   Communication: HOH  Cognition Arousal/Alertness: Awake/alert Behavior During Therapy: Anxious Overall Cognitive Status: Within Functional Limits for tasks assessed                      General Comments General comments (skin integrity, edema, etc.): Pt laying in bed upon PT arrival.  Pt agreeable to PT with some encouragement.    Exercises Total Joint Exercises Ankle Circles/Pumps: AROM;Strengthening;Right;10 reps;Supine Quad Sets: AROM;Strengthening;Right;10 reps;Supine Gluteal Sets: AROM;Strengthening;Both;10 reps;Supine Towel Squeeze: AROM;Strengthening;Both;10 reps;Supine Short Arc Quad: AAROM;Strengthening;Right;10 reps;Supine Heel Slides: AAROM;Strengthening;Right;10 reps;Supine Hip ABduction/ADduction: AAROM;Strengthening;Right;10 reps;Supine Straight Leg Raises: AAROM;Strengthening;Right;10 reps;Supine (limited range d/t R thigh pain)  Pt required vc's for correct technique of ex's.   Assessment/Plan    PT Assessment Patient needs continued PT services  PT Problem List Decreased strength;Decreased activity tolerance;Decreased balance;Decreased mobility;Decreased knowledge of use of DME;Decreased knowledge of precautions;Pain          PT Treatment Interventions DME instruction;Gait training;Stair training;Functional mobility training;Therapeutic activities;Therapeutic exercise;Balance training;Patient/family education    PT Goals (Current goals can be found in the Care Plan section)  Acute Rehab PT Goals Patient Stated Goal: to have less pain PT Goal Formulation: With patient Time For Goal Achievement: 10/16/16 Potential to Achieve Goals: Fair    Frequency BID   Barriers to  discharge Decreased caregiver support      Co-evaluation               End of Session Equipment Utilized During Treatment: Gait belt;Oxygen Activity Tolerance: Patient limited by pain Patient left: in chair;with call bell/phone within reach;with chair alarm set;with family/visitor present;with SCD's reapplied (B heels elevated via pillow) Nurse Communication: Mobility status;Patient requests pain meds;Precautions;Weight bearing status         Time: 0902-0938 PT Time Calculation (min) (ACUTE ONLY): 36 min   Charges:   PT Evaluation $PT Eval Low Complexity: 1 Procedure PT Treatments $Therapeutic Exercise: 8-22 mins   PT G CodesLeitha Bleak 31-Oct-2016, 9:59 AM Leitha Bleak, PT [phone]

## 2016-10-02 NOTE — Consult Note (Signed)
[facility] Nurse wound consult note Reason for Consult: R hip fracture after fall.  Unstageable pressure injury to left upper buttocks.   Wound type:unstageable pressure injury Pressure Ulcer POA: Yes Measurement:3 cm x 3 cm 100% eschar.  Edges are pulled away from periwound.  Minimal serosanguinous drainage.  Will begin enzymatic debridement.  Wound bed:100% eschar Drainage (amount, consistency, odor) Minimal serosanguinous.  MUsty odor.  Periwound:intact Dressing procedure/placement/frequency:Cleanse wound to left upper buttocks with NS and pat gently dry.  Apply Santyl to wound bed.  Cover with NS moist gauze.  Secure with 4x4 gauze, ABD pad and tape.  Change daily.  Will not follow at this time.  Please re-consult if needed.  Domenic Moras RN BSN [place] Pager [phone]

## 2016-10-02 NOTE — Progress Notes (Signed)
Physical Therapy Treatment Patient Details Name: [name] MRN: [id number] DOB: 1946/04/23 Today's Date: 10/02/2016    History of Present Illness Pt is a 70 y.o. female presenting to hospital s/p fall earlier in week (on Tuesday) on stairs in garage (scooted around on floor with R hip pain since).  Pt eventually agreeable to coming to hospital and found to have acute and comminuted intertrochanteric fx of proximal femur.  Pt s/p IMN R intertrochanteric 10/01/16.  Pt also found to have necrotic pressure sore L buttock.  PMH includes multiple R shoulder surgeries (x5), COPD, h/o significant substance abuse with prescription high dose narcotics, CTS s/p release, hiatal hernia, pelvic fx 2008, neck surgery.    PT Comments    Pt in chair ready to get back to bed.  Transferred to bedside commode then to bed.  She required min a x 1 for all mobility skills with overall decreased balance and safety awareness.  At one point she tried to stand without assistance and without walker to get to bedside commode and needed verbal and tactile cues to wait until writer was ready.  She declined exercises this pm due to fatigue and pain.  Will continue as appropriate.   Follow Up Recommendations  SNF     Equipment Recommendations  Rolling walker with 5" wheels    Recommendations for Other Services       Precautions / Restrictions Precautions Precautions: Fall Restrictions Weight Bearing Restrictions: Yes RLE Weight Bearing: Weight bearing as tolerated    Mobility  Bed Mobility Overal bed mobility: Needs Assistance Bed Mobility: Sit to Supine     Supine to sit: Min assist;HOB elevated        Transfers Overall transfer level: Needs assistance Equipment used: Rolling walker (2 wheeled) Transfers: Sit to/from Stand Sit to Stand: Min assist         General transfer comment: vc's for hand placement, and to wait for staff prior to standing  Ambulation/Gait Ambulation/Gait assistance:  Min assist Ambulation Distance (Feet): 3 Feet (x 2) Assistive device: Rolling walker (2 wheeled) Gait Pattern/deviations: Step-to pattern Gait velocity: decreased Gait velocity interpretation: <1.8 ft/sec, indicative of risk for recurrent falls General Gait Details: antalgic, poor safety and step pattern   Stairs            Wheelchair Mobility    Modified Rankin (Stroke Patients Only)       Balance Overall balance assessment: Needs assistance Sitting-balance support: Feet supported Sitting balance-Leahy Scale: Fair     Standing balance support: Bilateral upper extremity supported Standing balance-Leahy Scale: Fair                      Cognition Arousal/Alertness: Awake/alert Behavior During Therapy: Anxious Overall Cognitive Status: Within Functional Limits for tasks assessed                      Exercises Other Exercises Other Exercises: transfer to commode at bedside.      General Comments        Pertinent Vitals/Pain Pain Assessment: 0-10 Pain Score: 5  Pain Location: R hip Pain Descriptors / Indicators: Sore;Constant Pain Intervention(s): Limited activity within patient's tolerance;Premedicated before session    Home Living                      Prior Function            PT Goals (current goals can now be found in the care  plan section) Progress towards PT goals: Progressing toward goals    Frequency    BID      PT Plan Current plan remains appropriate    Co-evaluation             End of Session Equipment Utilized During Treatment: Gait belt Activity Tolerance: Patient limited by pain Patient left: in bed;with call bell/phone within reach;with bed alarm set;with SCD's reapplied     Time: 1430-1443 PT Time Calculation (min) (ACUTE ONLY): 13 min  Charges:  $Therapeutic Activity: 8-22 mins                    G Codes:      [name] 10/29/2016, 2:57 PM

## 2016-10-02 NOTE — Progress Notes (Signed)
[facility] NAME: Kieva Bartelme    MR#:  [id number]  DATE OF BIRTH:  22-Jan-1946  SUBJECTIVE:  CHIEF COMPLAINT:   Chief Complaint  Patient presents with  . Fall  . Hip Pain   Still has right hip pain. Sitting in chair today. Foley in place. Worked with physical therapy.  REVIEW OF SYSTEMS:    Review of Systems  Constitutional: Positive for malaise/fatigue. Negative for chills and fever.  HENT: Negative for sore throat.   Eyes: Negative for blurred vision, double vision and pain.  Respiratory: Negative for cough, hemoptysis, shortness of breath and wheezing.   Cardiovascular: Negative for chest pain, palpitations, orthopnea and leg swelling.  Gastrointestinal: Negative for abdominal pain, constipation, diarrhea, heartburn, nausea and vomiting.  Genitourinary: Negative for dysuria and hematuria.  Musculoskeletal: Positive for back pain and joint pain.  Skin: Negative for rash.  Neurological: Positive for weakness. Negative for sensory change, speech change, focal weakness and headaches.  Endo/Heme/Allergies: Does not bruise/bleed easily.  Psychiatric/Behavioral: Negative for depression. The patient is not nervous/anxious.     DRUG ALLERGIES:  No Known Allergies  VITALS:  Blood pressure 135/73, pulse (!) 109, temperature 97.9 F (36.6 C), temperature source Oral, resp. rate 18, height 5\' 4"  (1.626 m), weight 52.2 kg (115 lb), SpO2 94 %.  PHYSICAL EXAMINATION:   Physical Exam  GENERAL:  70 y.o.-year-old patient lying in the bed with no acute distress.  EYES: Pupils equal, round, reactive to light and accommodation. No scleral icterus. Extraocular muscles intact.  HEENT: Head atraumatic, normocephalic. Oropharynx and nasopharynx clear.  NECK:  Supple, no jugular venous distention. No thyroid enlargement, no tenderness.  LUNGS: Normal breath sounds bilaterally, no wheezing, rales, rhonchi. No use of accessory muscles of  respiration.  CARDIOVASCULAR: S1, S2 normal. No murmurs, rubs, or gallops.  ABDOMEN: Soft, nontender, nondistended. Bowel sounds present. No organomegaly or mass.  EXTREMITIES: No cyanosis, clubbing or edema b/l.   Right hip dressing NEUROLOGIC: Cranial nerves II through XII are intact. No focal Motor or sensory deficits b/l.   PSYCHIATRIC: The patient is alert and oriented x 3.  SKIN: No obvious rash, lesion, or ulcer.   LABORATORY PANEL:   CBC  Recent Labs Lab 10/02/16 0413  WBC 8.0  HGB 10.5*  HCT 31.7*  PLT 255   ------------------------------------------------------------------------------------------------------------------ Chemistries   Recent Labs Lab 10/01/16 0825  10/02/16 0413  NA 135  --  135  K 2.7*  < > 4.9  CL 100*  --  107  CO2 27  --  24  GLUCOSE 114*  --  176*  BUN 18  --  14  CREATININE 0.94  --  0.67  CALCIUM 8.7*  --  7.7*  MG 1.6*  --  2.0  AST 30  --   --   ALT 25  --   --   ALKPHOS 67  --   --   BILITOT 0.8  --   --   < > = values in this interval not displayed. ------------------------------------------------------------------------------------------------------------------  Cardiac Enzymes  Recent Labs Lab 10/01/16 0825  TROPONINI <0.03   ------------------------------------------------------------------------------------------------------------------  RADIOLOGY:  Dg Chest 1 View  Result Date: 10/01/2016 CLINICAL DATA:  Preoperative respiratory exam for right-sided hip fracture. EXAM: CHEST 1 VIEW COMPARISON:  07/03/2013 FINDINGS: The heart size and mediastinal contours are within normal limits. There is no evidence of pulmonary edema, consolidation, pneumothorax, nodule or pleural fluid. The visualized skeletal structures are unremarkable. IMPRESSION:  No active disease. Electronically Signed   By: Aletta Edouard M.D.   On: 10/01/2016 09:18   Ct Head Wo Contrast  Result Date: 10/01/2016 CLINICAL DATA:  Patient fell several days  ago and has been "scooting" around since that time. The patient is unable to get up and walk. EXAM: CT HEAD WITHOUT CONTRAST CT CERVICAL SPINE WITHOUT CONTRAST TECHNIQUE: Multidetector CT imaging of the head and cervical spine was performed following the standard protocol without intravenous contrast. Multiplanar CT image reconstructions of the cervical spine were also generated. COMPARISON:  MRI of the cervical spine February 10, 2014 FINDINGS: CT HEAD FINDINGS Brain: No subdural, epidural, or subarachnoid hemorrhage. Cerebellum, brainstem, and basal cisterns are within normal limits. No mass, mass effect, or midline shift. The ventricles and sulci are prominent, likely age related. Moderate white matter changes are seen. No acute cortical ischemia or infarct. Vascular: Calcified atherosclerosis in the intracranial carotid arteries. Skull: Normal. Negative for fracture or focal lesion. Sinuses/Orbits: No acute finding. Other: No other abnormalities. CT CERVICAL SPINE FINDINGS Alignment: There is straightening of normal lordosis. Anterior plate and screws are seen from C3 through C7. Hardware is intact. No traumatic malalignment. Skull base and vertebrae: Evaluation of C3 through C7 is limited due to streak artifact off of surgical hardware. The hardware is intact with no evidence of failure. No fractures are seen through these levels. Mild anterior wedging of T1 is stable since comparison MRI. Mild anterior wedging of T2 is also stable. No fractures are seen through the levels of C1 or C2 either. The alignment at C2-3 is due to mild flexion during the study. No prevertebral soft tissue swelling identified. The pre odontoid space is normal. Soft tissues and spinal canal: No prevertebral fluid or swelling. No visible canal hematoma. Disc levels:  Facet degenerative changes. Upper chest: Emphysematous changes in the lung apices. Other: No other abnormalities. IMPRESSION: 1. No acute intracranial abnormality. 2.  Postsurgical changes in the cervical spine with no fracture or traumatic malalignment. Electronically Signed   By: Dorise Bullion III M.D   On: 10/01/2016 09:21   Ct Cervical Spine Wo Contrast  Result Date: 10/01/2016 CLINICAL DATA:  Patient fell several days ago and has been "scooting" around since that time. The patient is unable to get up and walk. EXAM: CT HEAD WITHOUT CONTRAST CT CERVICAL SPINE WITHOUT CONTRAST TECHNIQUE: Multidetector CT imaging of the head and cervical spine was performed following the standard protocol without intravenous contrast. Multiplanar CT image reconstructions of the cervical spine were also generated. COMPARISON:  MRI of the cervical spine February 10, 2014 FINDINGS: CT HEAD FINDINGS Brain: No subdural, epidural, or subarachnoid hemorrhage. Cerebellum, brainstem, and basal cisterns are within normal limits. No mass, mass effect, or midline shift. The ventricles and sulci are prominent, likely age related. Moderate white matter changes are seen. No acute cortical ischemia or infarct. Vascular: Calcified atherosclerosis in the intracranial carotid arteries. Skull: Normal. Negative for fracture or focal lesion. Sinuses/Orbits: No acute finding. Other: No other abnormalities. CT CERVICAL SPINE FINDINGS Alignment: There is straightening of normal lordosis. Anterior plate and screws are seen from C3 through C7. Hardware is intact. No traumatic malalignment. Skull base and vertebrae: Evaluation of C3 through C7 is limited due to streak artifact off of surgical hardware. The hardware is intact with no evidence of failure. No fractures are seen through these levels. Mild anterior wedging of T1 is stable since comparison MRI. Mild anterior wedging of T2 is also stable. No fractures are seen  through the levels of C1 or C2 either. The alignment at C2-3 is due to mild flexion during the study. No prevertebral soft tissue swelling identified. The pre odontoid space is normal. Soft tissues and  spinal canal: No prevertebral fluid or swelling. No visible canal hematoma. Disc levels:  Facet degenerative changes. Upper chest: Emphysematous changes in the lung apices. Other: No other abnormalities. IMPRESSION: 1. No acute intracranial abnormality. 2. Postsurgical changes in the cervical spine with no fracture or traumatic malalignment. Electronically Signed   By: Dorise Bullion III M.D   On: 10/01/2016 09:21   Dg Hip Operative Unilat W Or W/o Pelvis Right  Result Date: 10/01/2016 CLINICAL DATA:  Right hip fracture repair EXAM: OPERATIVE RIGHT HIP (WITH PELVIS IF PERFORMED) 4 VIEWS TECHNIQUE: Fluoroscopic spot image(s) were submitted for interpretation post-operatively. COMPARISON:  None FLUOROSCOPY TIME:  40 seconds FINDINGS: Intraoperative fluoroscopic spot image demonstrates interval ORIF of a right intertrochanteric fracture. Right proximal intramedullary nail with a cannulated interlocking femoral neck screw transfixes the intertrochanteric fracture. No failure or complication. IMPRESSION: ORIF right intertrochanteric fracture. Electronically Signed   By: Kathreen Devoid   On: 10/01/2016 16:18   Dg Hip Unilat W Or Wo Pelvis 2-3 Views Right  Result Date: 10/01/2016 CLINICAL DATA:  Fall EXAM: DG HIP (WITH OR WITHOUT PELVIS) 2-3V RIGHT COMPARISON:  None FINDINGS: There is an acute and comminuted intertrochanteric fracture involving the proximal right femur. Medial angulation of the distal fracture fragments noted. No dislocation. IMPRESSION: 1. Acute and comminuted intertrochanteric fracture of the proximal right femur. Electronically Signed   By: Kerby Moors M.D.   On: 10/01/2016 09:18     ASSESSMENT AND PLAN:   Francesa Brunelle  is a 70 y.o. female with a known history of CAD, COPDAnd chronic pain and/or narcotics through pain clinic, anxiety, depression comes to the emergency room after she had fallen in her garage on Tuesday. Patient decided not to come here and will continue to scoot around at  home with right hip pain. She was unable to bear weight however she is to call. Her son made her come to the emergency room   1. S/p Reduction and internal fixation of displaced intertrochanteric right hip fracture POD # 1 Pain meds. PT SNF at discharge  2. Chronic pain with chronic narcotic use. Patient goes to the pain clinic [facility] at [facility] her pain meds and Zanaflex  3. COPD with ongoing tobacco abuse -Nicotine patch, incentive spirometer, early ambulation  4. Hypothyroidism continue Synthroid  5. Hyperlipidemia continue statins  6. DVT prophylaxis - Lovenox  All the records are reviewed and case discussed with Care Management/Social Workerr. Management plans discussed with the patient, family and they are in agreement.  CODE STATUS: FULL CODE  DVT Prophylaxis: SCDs  TOTAL TIME TAKING CARE OF THIS PATIENT: 30 minutes.   POSSIBLE D/C IN 2-3 DAYS, DEPENDING ON CLINICAL CONDITION.  Hillary Bow R M.D on 10/02/2016 at 11:20 AM  Between 7am to 6pm - Pager - [phone]  After 6pm go to [email or web address] - password [email or web address] [facility]  Office  [phone]  CC: Primary care physician; Albina Billet, MD  Note: This dictation was prepared with Dragon dictation along with smaller phrase technology. Any transcriptional errors that result from this process are unintentional.

## 2016-10-03 LAB — BASIC METABOLIC PANEL
ANION GAP: 5 (ref 5–15)
BUN: 9 mg/dL (ref 6–20)
CALCIUM: 8.2 mg/dL — AB (ref 8.9–10.3)
CO2: 28 mmol/L (ref 22–32)
Chloride: 104 mmol/L (ref 101–111)
Creatinine, Ser: 0.77 mg/dL (ref 0.44–1.00)
Glucose, Bld: 78 mg/dL (ref 65–99)
POTASSIUM: 4.7 mmol/L (ref 3.5–5.1)
Sodium: 137 mmol/L (ref 135–145)

## 2016-10-03 LAB — CBC WITH DIFFERENTIAL/PLATELET
BASOS PCT: 0 %
Basophils Absolute: 0 10*3/uL (ref 0–0.1)
EOS ABS: 0.1 10*3/uL (ref 0–0.7)
Eosinophils Relative: 1 %
HEMATOCRIT: 31.6 % — AB (ref 35.0–47.0)
HEMOGLOBIN: 10.7 g/dL — AB (ref 12.0–16.0)
LYMPHS ABS: 1.7 10*3/uL (ref 1.0–3.6)
Lymphocytes Relative: 15 %
MCH: 31.2 pg (ref 26.0–34.0)
MCHC: 34 g/dL (ref 32.0–36.0)
MCV: 91.8 fL (ref 80.0–100.0)
Monocytes Absolute: 0.8 10*3/uL (ref 0.2–0.9)
Monocytes Relative: 7 %
NEUTROS ABS: 8.9 10*3/uL — AB (ref 1.4–6.5)
NEUTROS PCT: 77 %
Platelets: 298 10*3/uL (ref 150–440)
RBC: 3.44 MIL/uL — AB (ref 3.80–5.20)
RDW: 13.4 % (ref 11.5–14.5)
WBC: 11.5 10*3/uL — AB (ref 3.6–11.0)

## 2016-10-03 LAB — MAGNESIUM: Magnesium: 1.7 mg/dL (ref 1.7–2.4)

## 2016-10-03 MED ORDER — MAGNESIUM SULFATE 2 GM/50ML IV SOLN
2.0000 g | Freq: Once | INTRAVENOUS | Status: AC
  Administered 2016-10-03: 2 g via INTRAVENOUS
  Filled 2016-10-03: qty 50

## 2016-10-03 NOTE — Progress Notes (Signed)
While rounding the unit, [place] visited Pt. Pt was her grandson and later, her brother and his wife joined her in the room. Pt told CH, she was struggling with her divorced as her husband took everything they had worked for together. Pt also talked about other things and then requested for prayers, which the [facility], provided and presence.     10/03/16 1500  Clinical Encounter Type  Visited With Patient and family together  Visit Type Initial;Spiritual support  Referral From Nurse  Consult/Referral To Chaplain  Spiritual Encounters  Spiritual Needs Prayer

## 2016-10-03 NOTE — Progress Notes (Signed)
Physical Therapy Treatment Patient Details Name: [name] MRN: [id number] DOB: 1946/04/13 Today's Date: 10/03/2016    History of Present Illness Pt is a 70 y.o. female presenting to hospital s/p fall earlier in week (on Tuesday) on stairs in garage (scooted around on floor with R hip pain since).  Pt eventually agreeable to coming to hospital and found to have acute and comminuted intertrochanteric fx of proximal femur.  Pt s/p IMN R intertrochanteric 10/01/16.  Pt also found to have necrotic pressure sore L buttock.  PMH includes multiple R shoulder surgeries (x5), COPD, h/o significant substance abuse with prescription high dose narcotics, CTS s/p release, hiatal hernia, pelvic fx 2008, neck surgery.    PT Comments    Pt offered session earlier this am but declined stating she needed to sleep.  Returned later in am and she was on bedside commode. She was able to stand and ambulate in room 15' with walker and min assist in room.  Overall gait and balance are improved today but she did have decreased safety upon return to chair "I just want to eat my breakfast"  Pt voiced frustrations over poor sleep and communication - she seems to contraindicate herself a lot during session which increases her frustrations.  ? Some confusion vs challenging personality.   Pt self terminated session due to wanting breakfast and pain meds.  Nsg aware of request for meds.   Follow Up Recommendations  SNF     Equipment Recommendations  Rolling walker with 5" wheels    Recommendations for Other Services       Precautions / Restrictions Precautions Precautions: Fall Restrictions Weight Bearing Restrictions: Yes RLE Weight Bearing: Weight bearing as tolerated    Mobility  Bed Mobility                  Transfers Overall transfer level: Needs assistance Equipment used: Rolling walker (2 wheeled) Transfers: Sit to/from Stand Sit to Stand: Min guard         General transfer comment:  improved hand placement and safet today.  Ambulation/Gait Ambulation/Gait assistance: Min assist Ambulation Distance (Feet): 15 Feet Assistive device: Rolling walker (2 wheeled) Gait Pattern/deviations: Step-to pattern Gait velocity: decreased Gait velocity interpretation: <1.8 ft/sec, indicative of risk for recurrent falls General Gait Details: improved gait today, improved balance   Stairs            Wheelchair Mobility    Modified Rankin (Stroke Patients Only)       Balance Overall balance assessment: Needs assistance Sitting-balance support: Feet supported Sitting balance-Leahy Scale: Fair     Standing balance support: Bilateral upper extremity supported Standing balance-Leahy Scale: Fair                      Cognition Arousal/Alertness: Awake/alert Behavior During Therapy: Anxious Overall Cognitive Status: Within Functional Limits for tasks assessed                      Exercises      General Comments        Pertinent Vitals/Pain Pain Assessment: 0-10 Pain Score: 7  Pain Location: R hip Pain Descriptors / Indicators: Constant;Sore Pain Intervention(s): Limited activity within patient's tolerance    Home Living                      Prior Function            PT Goals (current goals can now  be found in the care plan section) Progress towards PT goals: Progressing toward goals    Frequency    BID      PT Plan Current plan remains appropriate    Co-evaluation             End of Session Equipment Utilized During Treatment: Gait belt Activity Tolerance: Patient limited by pain Patient left: in chair;with call bell/phone within reach;with chair alarm set     Time: [id number] PT Time Calculation (min) (ACUTE ONLY): 15 min  Charges:  $Gait Training: 8-22 mins                    G Codes:      [name], PTA 10/03/16, 10:01 AM

## 2016-10-03 NOTE — Progress Notes (Signed)
OT Cancellation Note  Patient Details Name: [name] MRN: [id number] DOB: [birth year]   Cancelled Treatment:    Reason Eval/Treat Not Completed: Fatigue/lethargy limiting ability to participate (Pt. refused secondary to being tired. Will continue to monitor and eval when appropriate.)  Harrel Carina, MS, OTR/L 10/03/2016, 2:38 PM

## 2016-10-03 NOTE — Progress Notes (Signed)
Per MD progress note today patient will D/C in the next 2-3 days. Per [facility] admissions coordinator at [facility] authorization is still pending. Clinical Education officer, museum (CSW) sent additional PT notes to [facility] today. CSW will continue to follow and assist as needed.   [employer], LCSW [phone]

## 2016-10-03 NOTE — Progress Notes (Signed)
On assessment patient has diminished lungs. Heart sounds normal. Patient also has a stage 3 ulcer on her sacrum that RN applied santyl and new dressing to. Patient is complaining that her pain has not been controlled. RN educated patient on the benefit of taking PO pain meds rather than IV pain meds and the PO oxycodone did help "some". Patient is also complaining of constipation, RN administered milk of mag.   Deri Fuelling, RN

## 2016-10-03 NOTE — Progress Notes (Signed)
Physical Therapy Treatment Patient Details Name: [name] MRN: [id number] DOB: February 13, 1946 Today's Date: 10/03/2016    History of Present Illness Pt is a 69 y.o. female presenting to hospital s/p fall earlier in week (on Tuesday) on stairs in garage (scooted around on floor with R hip pain since).  Pt eventually agreeable to coming to hospital and found to have acute and comminuted intertrochanteric fx of proximal femur.  Pt s/p IMN R intertrochanteric 10/01/16.  Pt also found to have necrotic pressure sore L buttock.  PMH includes multiple R shoulder surgeries (x5), COPD, h/o significant substance abuse with prescription high dose narcotics, CTS s/p release, hiatal hernia, pelvic fx 2008, neck surgery.    PT Comments    Pt in chair ready for session.  Participated in exercises as described below.  Pt was able to stand and ambulate 15' x 2 in room with step to pattern.  Pt needed vc's to advance RLE first during gait.  Pt assisted to bedside commode per her request during session.  Pt generally calmer this session with less anxiety.     Follow Up Recommendations  SNF     Equipment Recommendations  Rolling walker with 5" wheels    Recommendations for Other Services       Precautions / Restrictions Precautions Precautions: Fall Restrictions Weight Bearing Restrictions: Yes RLE Weight Bearing: Weight bearing as tolerated    Mobility  Bed Mobility                  Transfers Overall transfer level: Needs assistance Equipment used: Rolling walker (2 wheeled) Transfers: Sit to/from Stand Sit to Stand: Min guard         General transfer comment: improved hand placement and safet today.  Ambulation/Gait Ambulation/Gait assistance: Min assist Ambulation Distance (Feet): 15 Feet (x 2) Assistive device: Rolling walker (2 wheeled) Gait Pattern/deviations: Step-to pattern Gait velocity: decreased Gait velocity interpretation: <1.8 ft/sec, indicative of risk for  recurrent falls General Gait Details: improved gait today, improved balance   Stairs            Wheelchair Mobility    Modified Rankin (Stroke Patients Only)       Balance Overall balance assessment: Needs assistance Sitting-balance support: Feet supported Sitting balance-Leahy Scale: Fair     Standing balance support: Bilateral upper extremity supported Standing balance-Leahy Scale: Fair                      Cognition Arousal/Alertness: Awake/alert Behavior During Therapy: WFL for tasks assessed/performed;Agitated Overall Cognitive Status: Within Functional Limits for tasks assessed                      Exercises Total Joint Exercises Ankle Circles/Pumps: AROM;Strengthening;Right;10 reps;Supine Quad Sets: AROM;Strengthening;Right;10 reps;Supine Gluteal Sets: AROM;Strengthening;Both;10 reps;Supine Towel Squeeze: AROM;Strengthening;Both;10 reps;Supine Heel Slides: AAROM;Strengthening;Right;10 reps;Supine Hip ABduction/ADduction: AAROM;Strengthening;Right;10 reps;Supine Straight Leg Raises: AAROM;Strengthening;Right;10 reps;Supine    General Comments        Pertinent Vitals/Pain Pain Assessment: 0-10 Pain Score: 7  Pain Location: R hip Pain Descriptors / Indicators: Constant;Sore Pain Intervention(s): Limited activity within patient's tolerance    Home Living                      Prior Function            PT Goals (current goals can now be found in the care plan section) Progress towards PT goals: Progressing toward goals    Frequency  BID      PT Plan Current plan remains appropriate    Co-evaluation             End of Session Equipment Utilized During Treatment: Gait belt Activity Tolerance: Patient limited by pain Patient left: in chair;with call bell/phone within reach;with chair alarm set     Time: 1050-1113 PT Time Calculation (min) (ACUTE ONLY): 23 min  Charges:  $Gait Training: 8-22  mins $Therapeutic Exercise: 8-22 mins                    G Codes:      [name] October 16, 2016, 11:56 AM

## 2016-10-03 NOTE — Progress Notes (Signed)
[facility] [place] NAME: [name]    MR#:  [id number]  DATE OF BIRTH:  October 06, 1946  SUBJECTIVE: Patient is seen at the bedside, complaints of right hip pain, pain not well controlled. She denies any other complaints.   CHIEF COMPLAINT:   Chief Complaint  Patient presents with  . Fall  . Hip Pain   Still has right hip pain. Sitting in chair today. Foley in place. Worked with physical therapy.  REVIEW OF SYSTEMS:    Review of Systems  Constitutional: Positive for malaise/fatigue. Negative for chills and fever.  HENT: Negative for sore throat.   Eyes: Negative for blurred vision, double vision and pain.  Respiratory: Negative for cough, hemoptysis, shortness of breath and wheezing.   Cardiovascular: Negative for chest pain, palpitations, orthopnea and leg swelling.  Gastrointestinal: Negative for abdominal pain, constipation, diarrhea, heartburn, nausea and vomiting.  Genitourinary: Negative for dysuria and hematuria.  Musculoskeletal: Positive for back pain and joint pain.  Skin: Negative for rash.  Neurological: Positive for weakness. Negative for sensory change, speech change, focal weakness and headaches.  Endo/Heme/Allergies: Does not bruise/bleed easily.  Psychiatric/Behavioral: Negative for depression. The patient is not nervous/anxious.     DRUG ALLERGIES:  No Known Allergies  VITALS:  Blood pressure 114/69, pulse (!) 113, temperature 98.6 F (37 C), temperature source Oral, resp. rate 17, height 5\' 4"  (1.626 m), weight 52.2 kg (115 lb), SpO2 94 %.  PHYSICAL EXAMINATION:   Physical Exam  GENERAL:  70 y.o.-year-old patient lying in the bed with no acute distress.  EYES: Pupils equal, round, reactive to light and accommodation. No scleral icterus. Extraocular muscles intact.  HEENT: Head atraumatic, normocephalic. Oropharynx and nasopharynx clear.  NECK:  Supple, no jugular venous distention. No thyroid enlargement, no  tenderness.  LUNGS: Normal breath sounds bilaterally, no wheezing, rales, rhonchi. No use of accessory muscles of respiration.  CARDIOVASCULAR: S1, S2 normal. No murmurs, rubs, or gallops.  ABDOMEN: Soft, nontender, nondistended. Bowel sounds present. No organomegaly or mass.  EXTREMITIES: No cyanosis, clubbing or edema b/l.   Right hip dressing NEUROLOGIC: Cranial nerves II through XII are intact. No focal Motor or sensory deficits b/l.   PSYCHIATRIC: The patient is alert and oriented x 3.  SKIN: No obvious rash, lesion, or ulcer.   LABORATORY PANEL:   CBC  Recent Labs Lab 10/03/16 0452  WBC 11.5*  HGB 10.7*  HCT 31.6*  PLT 298   ------------------------------------------------------------------------------------------------------------------ Chemistries   Recent Labs Lab 10/01/16 0825  10/03/16 0452  NA 135  < > 137  K 2.7*  < > 4.7  CL 100*  < > 104  CO2 27  < > 28  GLUCOSE 114*  < > 78  BUN 18  < > 9  CREATININE 0.94  < > 0.77  CALCIUM 8.7*  < > 8.2*  MG 1.6*  < > 1.7  AST 30  --   --   ALT 25  --   --   ALKPHOS 67  --   --   BILITOT 0.8  --   --   < > = values in this interval not displayed. ------------------------------------------------------------------------------------------------------------------  Cardiac Enzymes  Recent Labs Lab 10/01/16 0825  TROPONINI <0.03   ------------------------------------------------------------------------------------------------------------------  RADIOLOGY:  Dg Hip Operative Unilat W Or W/o Pelvis Right  Result Date: 10/01/2016 CLINICAL DATA:  Right hip fracture repair EXAM: OPERATIVE RIGHT HIP (WITH PELVIS IF PERFORMED) 4 VIEWS TECHNIQUE: Fluoroscopic spot image(s) were  submitted for interpretation post-operatively. COMPARISON:  None FLUOROSCOPY TIME:  40 seconds FINDINGS: Intraoperative fluoroscopic spot image demonstrates interval ORIF of a right intertrochanteric fracture. Right proximal intramedullary nail with a  cannulated interlocking femoral neck screw transfixes the intertrochanteric fracture. No failure or complication. IMPRESSION: ORIF right intertrochanteric fracture. Electronically Signed   By: Kathreen Devoid   On: 10/01/2016 16:18     ASSESSMENT AND PLAN:   [name]  is a 70 y.o. female with a known history of CAD, COPDAnd chronic pain and/or narcotics through pain clinic, anxiety, depression comes to the emergency room after she had fallen in her garage on Tuesday. Patient decided not to come here and will continue to scoot around at home with right hip pain. She was unable to bear weight however she is to call. Her son made her come to the emergency room   1. S/p Reduction and internal fixation of displaced intertrochanteric right hip fracture POD # 2 Already on oxycodone, MS Contin. Can't give her any more pain medications,  Continue physical therapy, if she doesn't progress well with therapy today patient needs to go to rehabilitation SNF at discharge  2. Chronic pain with chronic narcotic use.  Patient goes to the pain clinic here at [facility] her pain meds and Zanaflex  3. COPD with ongoing tobacco abuse -Nicotine patch, incentive spirometer, early ambulation  4. Hypothyroidism continue Synthroid  5. Hyperlipidemia continue statins  6. DVT prophylaxis - Lovenox   All the records are reviewed and case discussed with Care Management/Social Workerr. Management plans discussed with the patient, family and they are in agreement.  CODE STATUS: FULL CODE  DVT Prophylaxis: SCDs  TOTAL TIME TAKING CARE OF THIS PATIENT: 30 minutes.   POSSIBLE D/C IN 2-3 DAYS, DEPENDING ON CLINICAL CONDITION.  Epifanio Lesches M.D on 10/03/2016 at 9:56 AM  Between 7am to 6pm - Pager - [phone]  After 6pm go to [email or web address] - password [email or web address] [facility]  Office  [phone]  CC: Primary care physician; Albina Billet, MD  Note: This dictation was prepared  with Dragon dictation along with smaller phrase technology. Any transcriptional errors that result from this process are unintentional.

## 2016-10-03 NOTE — Progress Notes (Signed)
   Subjective: 2 Days Post-Op Procedure(s) (LRB): INTRAMEDULLARY (IM) NAIL INTERTROCHANTRIC (Right) Patient reports pain as severe, right proximal thigh. Patient appears comfortable. Patient is well, and has had no acute complaints or problems Denies any CP, SOB, ABD pain. We will continue therapy today.    Objective: Vital signs in last 24 hours: Temp:  [98.2 F (36.8 C)-99.2 F (37.3 C)] 99.2 F (37.3 C) (11/14 0434) Pulse Rate:  [105-121] 108 (11/14 0434) Resp:  [17-19] 19 (11/14 0434) BP: (115-145)/(48-69) 117/69 (11/14 0434) SpO2:  [92 %-95 %] 94 % (11/14 0434)  Intake/Output from previous day: 11/13 0701 - 11/14 0700 In: 240 [P.O.:240] Out: -  Intake/Output this shift: No intake/output data recorded.   Recent Labs  10/01/16 0825 10/02/16 0413 10/03/16 0452  HGB 13.8 10.5* 10.7*    Recent Labs  10/02/16 0413 10/03/16 0452  WBC 8.0 11.5*  RBC 3.43* 3.44*  HCT 31.7* 31.6*  PLT 255 298    Recent Labs  10/02/16 0413 10/03/16 0452  NA 135 137  K 4.9 4.7  CL 107 104  CO2 24 28  BUN 14 9  CREATININE 0.67 0.77  GLUCOSE 176* 78  CALCIUM 7.7* 8.2*    Recent Labs  10/01/16 0825  INR 1.08    EXAM General - Patient is Alert, Appropriate and Oriented. Appears comfortable. No distress. Extremity - Neurovascular intact Sensation intact distally Intact pulses distally Dorsiflexion/Plantar flexion intact No cellulitis present Compartment soft  - Homans sign Dressing - dressing C/D/I and no drainage Motor Function - intact, moving foot and toes well on exam.   Past Medical History:  Diagnosis Date  . Anxiety   . CAD (coronary artery disease)   . Carpal tunnel syndrome   . Complication of anesthesia    has woken up at times  . COPD (chronic obstructive pulmonary disease) ([place])   . CRP elevated 09/14/2015  . Depression   . Difficult intubation   . Dyslipidemia   . Dysrhythmia    hx palpatations  . Elevated sedimentation rate 09/14/2015  .  Esophageal spasm   . Gastrointestinal parasites   . Hiatal hernia   . History of peptic ulcer disease   . Hyperthyroidism   . Hypothyroidism   . Low magnesium levels 09/14/2015  . Pelvic fracture ([place]) 2008   fall from riding a horse  . Reflux   . Rotator cuff injury     Assessment/Plan:   2 Days Post-Op Procedure(s) (LRB): INTRAMEDULLARY (IM) NAIL INTERTROCHANTRIC (Right) Active Problems:   Hip fracture (HCC)   Pressure injury of skin  Estimated body mass index is 19.74 kg/m as calculated from the following:   Height as of this encounter: 5\' 4"  (1.626 m).   Weight as of this encounter: 52.2 kg (115 lb). Advance diet Up with therapy  Needs BM Severe pain - Hx of chronic pain with narcotic use, continue with current pain regimen Acute post op blood loss anemia-  Hgb/Hct stable. Recheck in the am Care management to assist with discharge to SNF   DVT Prophylaxis - Lovenox, Foot Pumps and TED hose Weight-Bearing as tolerated to right leg   T. Rachelle Hora, PA-C [place] 10/03/2016, 7:19 AM

## 2016-10-03 NOTE — Progress Notes (Signed)
MEDICATION RELATED CONSULT NOTE - Follow up  Pharmacy Consult for Electrolyte Monitoring Indication: Hypokalemia  No Known Allergies  Patient Measurements: Height: 5\' 4"  (162.6 cm) Weight: 115 lb (52.2 kg) IBW/kg (Calculated) : 54.7   Vital Signs: Temp: 99.2 F (37.3 C) (11/14 0434) Temp Source: Oral (11/14 0434) BP: 117/69 (11/14 0434) Pulse Rate: 108 (11/14 0434) Intake/Output from previous day: 11/13 0701 - 11/14 0700 In: 240 [P.O.:240] Out: -  Intake/Output from this shift: No intake/output data recorded.  Labs:  Recent Labs  10/01/16 0825 10/02/16 0413 10/03/16 0452  WBC 11.3* 8.0 11.5*  HGB 13.8 10.5* 10.7*  HCT 41.4 31.7* 31.6*  PLT 368 255 298  CREATININE 0.94 0.67 0.77  MG 1.6* 2.0 1.7  PHOS  --  2.9  --   ALBUMIN 3.4*  --   --   PROT 7.1  --   --   AST 30  --   --   ALT 25  --   --   ALKPHOS 67  --   --   BILITOT 0.8  --   --    Estimated Creatinine Clearance: 54.7 mL/min (by C-G formula based on SCr of 0.77 mg/dL).   Assessment: 70 y/o F with a h/o CAD and COPD admitted with hip fracture, hypokalemia. Pharmacy consulted to assist in managing electrolytes.   K 4.7, Mag 1.7   Plan:  Per protocol will order mag sulfate 2g IV x1.  Will recheck electrolytes in the AM.  Pharmacy will continue to follow.  Rayna Sexton L 10/03/2016,7:43 AM

## 2016-10-04 DIAGNOSIS — I2510 Atherosclerotic heart disease of native coronary artery without angina pectoris: Secondary | ICD-10-CM | POA: Diagnosis not present

## 2016-10-04 DIAGNOSIS — J449 Chronic obstructive pulmonary disease, unspecified: Secondary | ICD-10-CM | POA: Diagnosis not present

## 2016-10-04 DIAGNOSIS — R6889 Other general symptoms and signs: Secondary | ICD-10-CM | POA: Diagnosis not present

## 2016-10-04 DIAGNOSIS — Z7401 Bed confinement status: Secondary | ICD-10-CM | POA: Diagnosis not present

## 2016-10-04 DIAGNOSIS — Z716 Tobacco abuse counseling: Secondary | ICD-10-CM | POA: Diagnosis not present

## 2016-10-04 DIAGNOSIS — M810 Age-related osteoporosis without current pathological fracture: Secondary | ICD-10-CM | POA: Diagnosis not present

## 2016-10-04 DIAGNOSIS — E785 Hyperlipidemia, unspecified: Secondary | ICD-10-CM | POA: Diagnosis not present

## 2016-10-04 DIAGNOSIS — R69 Illness, unspecified: Secondary | ICD-10-CM | POA: Diagnosis not present

## 2016-10-04 DIAGNOSIS — K5900 Constipation, unspecified: Secondary | ICD-10-CM | POA: Diagnosis not present

## 2016-10-04 DIAGNOSIS — I10 Essential (primary) hypertension: Secondary | ICD-10-CM | POA: Diagnosis not present

## 2016-10-04 DIAGNOSIS — D72829 Elevated white blood cell count, unspecified: Secondary | ICD-10-CM | POA: Diagnosis not present

## 2016-10-04 DIAGNOSIS — G8929 Other chronic pain: Secondary | ICD-10-CM | POA: Diagnosis not present

## 2016-10-04 DIAGNOSIS — S72009A Fracture of unspecified part of neck of unspecified femur, initial encounter for closed fracture: Secondary | ICD-10-CM | POA: Diagnosis not present

## 2016-10-04 DIAGNOSIS — W19XXXD Unspecified fall, subsequent encounter: Secondary | ICD-10-CM | POA: Diagnosis not present

## 2016-10-04 DIAGNOSIS — S72141D Displaced intertrochanteric fracture of right femur, subsequent encounter for closed fracture with routine healing: Secondary | ICD-10-CM | POA: Diagnosis not present

## 2016-10-04 DIAGNOSIS — L89320 Pressure ulcer of left buttock, unstageable: Secondary | ICD-10-CM | POA: Diagnosis not present

## 2016-10-04 DIAGNOSIS — E876 Hypokalemia: Secondary | ICD-10-CM | POA: Diagnosis not present

## 2016-10-04 LAB — BASIC METABOLIC PANEL
ANION GAP: 6 (ref 5–15)
BUN: 9 mg/dL (ref 6–20)
CO2: 31 mmol/L (ref 22–32)
Calcium: 8.2 mg/dL — ABNORMAL LOW (ref 8.9–10.3)
Chloride: 101 mmol/L (ref 101–111)
Creatinine, Ser: 0.79 mg/dL (ref 0.44–1.00)
GFR calc Af Amer: >60 mL/min (ref >60)
Glucose, Bld: 74 mg/dL (ref 65–99)
POTASSIUM: 4.8 mmol/L (ref 3.5–5.1)
SODIUM: 138 mmol/L (ref 135–145)

## 2016-10-04 LAB — MAGNESIUM: MAGNESIUM: 1.9 mg/dL (ref 1.7–2.4)

## 2016-10-04 MED ORDER — ENOXAPARIN SODIUM 40 MG/0.4ML ~~LOC~~ SOLN: 40.0000 mg | SUBCUTANEOUS | 0 refills | Status: DC

## 2016-10-04 MED ORDER — ALPRAZOLAM 1 MG PO TABS: 1.0000 mg | ORAL_TABLET | Freq: Four times a day (QID) | ORAL | 0 refills | Status: DC | PRN

## 2016-10-04 MED ORDER — OXYCODONE HCL 5 MG PO TABS: 5.0000 mg | ORAL_TABLET | ORAL | 0 refills | Status: DC | PRN

## 2016-10-04 MED ORDER — SENNOSIDES-DOCUSATE SODIUM 8.6-50 MG PO TABS: 1.0000 | ORAL_TABLET | Freq: Every evening | ORAL | 0 refills | Status: DC | PRN

## 2016-10-04 MED ORDER — PANTOPRAZOLE SODIUM 40 MG PO TBEC: 40.0000 mg | DELAYED_RELEASE_TABLET | Freq: Every day | ORAL | 0 refills | Status: DC

## 2016-10-04 MED ORDER — MORPHINE SULFATE ER 30 MG PO TBCR: 30.0000 mg | EXTENDED_RELEASE_TABLET | Freq: Two times a day (BID) | ORAL | 0 refills | Status: DC

## 2016-10-04 NOTE — Progress Notes (Signed)
Subjective: 3 Days Post-Op Procedure(s) (LRB): INTRAMEDULLARY (IM) NAIL INTERTROCHANTRIC (Right) Patient reports pain as mild.   Patient is well, and has had no acute complaints or problems Denies any CP, SOB, ABD pain. We will continue therapy today.   Objective: Vital signs in last 24 hours: Temp:  [98.5 F (36.9 C)-99.4 F (37.4 C)] 98.5 F (36.9 C) (11/15 0419) Pulse Rate:  [107-115] 108 (11/15 0419) Resp:  [16-19] 18 (11/15 0419) BP: (114-148)/(69-81) 144/70 (11/15 0419) SpO2:  [93 %-97 %] 97 % (11/15 0419)  Intake/Output from previous day: 11/14 0701 - 11/15 0700 In: 120 [P.O.:120] Out: -  Intake/Output this shift: No intake/output data recorded.   Recent Labs  10/01/16 0825 10/02/16 0413 10/03/16 0452  HGB 13.8 10.5* 10.7*    Recent Labs  10/02/16 0413 10/03/16 0452  WBC 8.0 11.5*  RBC 3.43* 3.44*  HCT 31.7* 31.6*  PLT 255 298    Recent Labs  10/03/16 0452 10/04/16 0451  NA 137 138  K 4.7 4.8  CL 104 101  CO2 28 31  BUN 9 9  CREATININE 0.77 0.79  GLUCOSE 78 74  CALCIUM 8.2* 8.2*    Recent Labs  10/01/16 0825  INR 1.08    EXAM General - Patient is Alert, Appropriate and Oriented Extremity - Neurovascular intact Sensation intact distally Intact pulses distally Dorsiflexion/Plantar flexion intact No cellulitis present Compartment soft Dressing - dressing C/D/I and no drainage Motor Function - intact, moving foot and toes well on exam.   Past Medical History:  Diagnosis Date  . Anxiety   . CAD (coronary artery disease)   . Carpal tunnel syndrome   . Complication of anesthesia    has woken up at times  . COPD (chronic obstructive pulmonary disease) ([place])   . CRP elevated 09/14/2015  . Depression   . Difficult intubation   . Dyslipidemia   . Dysrhythmia    hx palpatations  . Elevated sedimentation rate 09/14/2015  . Esophageal spasm   . Gastrointestinal parasites   . Hiatal hernia   . History of peptic ulcer disease   .  Hyperthyroidism   . Hypothyroidism   . Low magnesium levels 09/14/2015  . Pelvic fracture ([place]) 2008   fall from riding a horse  . Reflux   . Rotator cuff injury     Assessment/Plan:   3 Days Post-Op Procedure(s) (LRB): INTRAMEDULLARY (IM) NAIL INTERTROCHANTRIC (Right) Active Problems:   Hip fracture (HCC)   Pressure injury of skin  Estimated body mass index is 19.74 kg/m as calculated from the following:   Height as of this encounter: 5\' 4"  (1.626 m).   Weight as of this encounter: 52.2 kg (115 lb). Advance diet Up with therapy  Needs BM, while in room patient stated that she needed the bedside commode. Labs reviewed this AM, Hg 10.7 yesterday,  Care management to assist with discharge, patient would like to go home however, PT at this time is recommending SNF placement.  Pt will continue Lovenox 40mg  daily for 14 days. Follow-up with [facility] in 10-14 days for staple removal. Pt can be discharged when medically appropriate and following a bowel movement.  DVT Prophylaxis - Lovenox, Foot Pumps and TED hose Weight-Bearing as tolerated to right leg   J. Cameron Proud, PA-C [place] 10/04/2016, 7:50 AM

## 2016-10-04 NOTE — Care Management Important Message (Signed)
Important Message  Patient Details  Name: [name] MRN: [id number] Date of Birth: 1946/04/16   Medicare Important Message Given:  Yes    Jolly Mango, RN 10/04/2016, 2:32 PM

## 2016-10-04 NOTE — Progress Notes (Signed)
Patient called nurse from [facility] telling her that she needed her pain medication and all of her "other medication". Nurse explained that she was no longer at the hospital and that the facility she is in would take care of her medications from now on. She stated she left her earrings, her "diamond earrings" and that a nurse last night had put them in her belongings bag. Checked room and also checked admission where there was no jewelery inventoried. Nurse spoke to afternoon nurse at [facility], Bethena Roys. She said she would talk with patient about giving her meds as well as the earrings.

## 2016-10-04 NOTE — Progress Notes (Signed)
Physical Therapy Treatment Patient Details Name: [name] MRN: [id number] DOB: March 04, 1946 Today's Date: 10/04/2016    History of Present Illness Pt is a 70 y.o. female presenting to hospital s/p fall earlier in week (on Tuesday) on stairs in garage (scooted around on floor with R hip pain since).  Pt eventually agreeable to coming to hospital and found to have acute and comminuted intertrochanteric fx of proximal femur.  Pt s/p IMN R intertrochanteric 10/01/16.  Pt also found to have necrotic pressure sore L buttock.  PMH includes multiple R shoulder surgeries (x5), COPD, h/o significant substance abuse with prescription high dose narcotics, CTS s/p release, hiatal hernia, pelvic fx 2008, neck surgery.    PT Comments    Pt refused transfer/gait training AM session secondary to fatigue/R hip pain but stated would participate during PM session.  Pt agreed to supine therex this session and participated with good effort although was somewhat limited by R hip pain.  Pt will benefit from PT services to address functional deficits for decreased caregiver assistance upon discharge.   Follow Up Recommendations  SNF     Equipment Recommendations  Rolling walker with 5" wheels    Recommendations for Other Services       Precautions / Restrictions Precautions Precautions: Fall Restrictions Weight Bearing Restrictions: Yes RLE Weight Bearing: Weight bearing as tolerated    Mobility  Bed Mobility Overal bed mobility: Needs Assistance Bed Mobility: Rolling Rolling: Min assist            Transfers                 General transfer comment:  (Pt refused transfers this session secondary to fatigue)  Ambulation/Gait             General Gait Details:  (Pt refused amb this session secondary to fatigue)   Stairs            Wheelchair Mobility    Modified Rankin (Stroke Patients Only)       Balance                                     Cognition Arousal/Alertness: Awake/alert Behavior During Therapy: WFL for tasks assessed/performed Overall Cognitive Status: Within Functional Limits for tasks assessed                      Exercises Total Joint Exercises Ankle Circles/Pumps: AROM;Strengthening;Both;10 reps;15 reps Quad Sets: AROM;Both;10 reps;15 reps Gluteal Sets: AROM;Both;10 reps Short Arc Quad: AROM;Right;10 reps;15 reps Heel Slides: AAROM;Right;10 reps;15 reps Hip ABduction/ADduction: AAROM;Right;10 reps;15 reps Straight Leg Raises: AAROM;Right;10 reps;15 reps Bridges: AROM;Both;5 reps;10 reps (With RLE over bolster, limited amplitude)    General Comments        Pertinent Vitals/Pain Pain Score: 6  Pain Location: R hip Pain Descriptors / Indicators: Aching;Constant Pain Intervention(s): Monitored during session;Patient requesting pain meds-RN notified;Limited activity within patient's tolerance    Home Living                      Prior Function            PT Goals (current goals can now be found in the care plan section) Progress towards PT goals: Progressing toward goals (Limited by fatigue and pain during this session)    Frequency    BID      PT Plan Current plan remains appropriate  Co-evaluation             End of Session   Activity Tolerance: Patient limited by fatigue;Patient limited by pain Patient left: in bed;with bed alarm set;with call bell/phone within reach     Time: [id number] PT Time Calculation (min) (ACUTE ONLY): 24 min  Charges:  $Therapeutic Exercise: 23-37 mins                    G Codes:      DRoyetta Asal PT, DPT 10/04/16, 11:27 AM

## 2016-10-04 NOTE — Clinical Social Work Placement (Signed)
   CLINICAL SOCIAL WORK PLACEMENT  NOTE  Date:  10/04/2016  Patient Details  Name: [name] MRN: [id number] Date of Birth: Sep 08, 1946  Clinical Social Work is seeking post-discharge placement for this patient at the [place] level of care (*CSW will initial, date and re-position this form in  chart as items are completed):  Yes   Patient/family provided with [place] Work Department's list of facilities offering this level of care within the geographic area requested by the patient (or if unable, by the patient's family).  Yes   Patient/family informed of their freedom to choose among providers that offer the needed level of care, that participate in Medicare, Medicaid or managed care program needed by the patient, have an available bed and are willing to accept the patient.  Yes   Patient/family informed of 's ownership interest in [facility] and [facility], as well as of the fact that they are under no obligation to receive care at these facilities.  PASRR submitted to EDS on 10/02/16     PASRR number received on 10/02/16     Existing PASRR number confirmed on       FL2 transmitted to all facilities in geographic area requested by pt/family on 10/02/16     FL2 transmitted to all facilities within larger geographic area on       Patient informed that his/her managed care company has contracts with or will negotiate with certain facilities, including the following:        Yes   Patient/family informed of bed offers received.  Patient chooses bed at  [facility] )     Physician recommends and patient chooses bed at      Patient to be transferred to  [facility] ) on 10/04/16.  Patient to be transferred to facility by  [facility] )     Patient family notified on 10/04/16 of transfer.  Name of family member notified:   (Patient's mother Nyssa is aware of D/C today. )     PHYSICIAN        Additional Comment:    _______________________________________________ Daeshaun Specht, Veronia Beets, LCSW 10/04/2016, 11:46 AM

## 2016-10-04 NOTE — Discharge Summary (Signed)
[name], is a 70 y.o. female  DOB 1946/07/28  MRN [id number].  Admission date:  10/01/2016  Admitting Physician  Fritzi Mandes, MD  Discharge Date:  10/04/2016   Primary MD  Albina Billet, MD  Recommendations for primary care physician for things to follow:  Follow-up with [facility]  In  2 weeks for staple removal.   Admission Diagnosis  Hypokalemia [E87.6] Fall, initial encounter [W19.XXXA] Closed displaced intertrochanteric fracture of right femur, initial encounter ([place]) [S72.141A] Leukocytosis, unspecified type [D72.829]   Discharge Diagnosis  Hypokalemia [E87.6] Fall, initial encounter [W19.XXXA] Closed displaced intertrochanteric fracture of right femur, initial encounter ([place]) [S72.141A] Leukocytosis, unspecified type [D72.829]    Active Problems:   Hip fracture ([place])   Pressure injury of skin      Past Medical History:  Diagnosis Date  . Anxiety   . CAD (coronary artery disease)   . Carpal tunnel syndrome   . Complication of anesthesia    has woken up at times  . COPD (chronic obstructive pulmonary disease) ([place])   . CRP elevated 09/14/2015  . Depression   . Difficult intubation   . Dyslipidemia   . Dysrhythmia    hx palpatations  . Elevated sedimentation rate 09/14/2015  . Esophageal spasm   . Gastrointestinal parasites   . Hiatal hernia   . History of peptic ulcer disease   . Hyperthyroidism   . Hypothyroidism   . Low magnesium levels 09/14/2015  . Pelvic fracture ([place]) 2008   fall from riding a horse  . Reflux   . Rotator cuff injury     Past Surgical History:  Procedure Laterality Date  . CARPAL TUNNEL RELEASE    . CESAREAN SECTION    . CHOLECYSTECTOMY    . INTRAMEDULLARY (IM) NAIL INTERTROCHANTERIC Right 10/01/2016   Procedure: INTRAMEDULLARY (IM) NAIL INTERTROCHANTRIC;   Surgeon: Corky Mull, MD;  Location: [facility];  Service: Orthopedics;  Laterality: Right;  . NECK SURGERY    . ROTATOR CUFF REPAIR     x2  . SHOULDER ARTHROSCOPY  12/07/2011   Procedure: ARTHROSCOPY SHOULDER;  Surgeon: Ninetta Lights, MD;  Location: [place];  Service: Orthopedics;  Laterality: Right;  Debridement Partial Cuff Tear, Release Coracoacromial Ligament  . SHOULDER SURGERY  12/07/2011   right       History of present illness and  Hospital Course:     Kindly see H&P for history of present illness and admission details, please review complete Labs, Consult reports and Test reports for all details in brief  HPI  from the history and physical done on the day of admission 70 year old female patient with history of coronary artery disease, COPD, chronic pain follows up with pain clinic comes in because of fall, right hip pain, found to have right hip fracture.   Hospital Course  1. Acute right intertrochanteric fracture status post: Admitted to medical unit, seen by orthopedic, patient had ORIF on November on same day. Patient tolerated the procedure well, however has lot of right hip pain continued to request pain medications. Physical therapy recommended skilled nursing, patient is going to [facility] and arrangements are made. Lovenox 40 mg subcutaneous daily for 14 days. She is on MS Contin 30 mg every 12 hours, oxycodone 5 mg every 3 hours as needed for pain. Patient has narcotic abuse, posterior pain clinic. A limited supply of pain medications. #2 hypothyroidism continue Synthroid  #3. history of COPD with ongoing tobacco abuse, no wheezing. Continue  incentive spirometry,advair. [place] Kitchen 4.anxiety and depression: Continue Xanax, lexapro, 5. Hyperlipidemia continue atorvastatin #6 constipation continue stool softeners as needed  #7. pressure injury to skin: Patient has unstageable pressure injury to left upper buttocks: Seen by wound care  nurse.      Discharge Condition: s   Follow UP   Contact information for follow-up providers    Judson Roch, PA-C Follow up on 10/18/2016.   Specialty:  Physician Assistant Why:  Staple removal at 53 Contact information: [place] [address] [phone]            Contact information for after-discharge care    Destination    [facility] Follow up.   Specialty:  [place] information: [place] [phone]                    Discharge Instructions  and  Discharge Medications        Medication List    TAKE these medications   albuterol-ipratropium 18-103 MCG/ACT inhaler Commonly known as:  COMBIVENT Inhale 2 puffs into the lungs every 6 (six) hours as needed.   ALPRAZolam 1 MG tablet Commonly known as:  XANAX Take 1 tablet (1 mg total) by mouth 4 (four) times daily as needed.   atorvastatin 40 MG tablet Commonly known as:  LIPITOR Take 40 mg by mouth daily.   buPROPion 150 MG 12 hr tablet Commonly known as:  WELLBUTRIN SR Take 150 mg by mouth 2 (two) times daily.   enoxaparin 40 MG/0.4ML injection Commonly known as:  LOVENOX Inject 0.4 mLs (40 mg total) into the skin daily. Start taking on:  10/05/2016   escitalopram 10 MG tablet Commonly known as:  LEXAPRO Take 10 mg by mouth daily.   Fluticasone-Salmeterol 100-50 MCG/DOSE Aepb Commonly known as:  ADVAIR Inhale 1 puff into the lungs every 12 (twelve) hours.   levothyroxine 88 MCG tablet Commonly known as:  SYNTHROID, LEVOTHROID Take 88 mcg by mouth daily.   magnesium oxide 400 MG tablet Commonly known as:  MAG-OX Take 400 mg by mouth 2 (two) times daily.   morphine 30 MG 12 hr tablet Commonly known as:  MS CONTIN Take 1 tablet (30 mg total) by mouth every 12 (twelve) hours.   oxyCODONE 5 MG immediate release tablet Commonly known  as:  Oxy IR/ROXICODONE Take 1-2 tablets (5-10 mg total) by mouth every 3 (three) hours as needed for moderate pain.   pantoprazole 40 MG tablet Commonly known as:  PROTONIX Take 1 tablet (40 mg total) by mouth daily.   senna-docusate 8.6-50 MG tablet Commonly known as:  Senokot-S Take 1 tablet by mouth at bedtime as needed for mild constipation.   tizanidine 2 MG capsule Commonly known as:  ZANAFLEX Take 1 capsule (2 mg total) by mouth 3 (three) times daily as needed for muscle spasms.   traZODone 150 MG tablet Commonly known as:  DESYREL Take 150 mg by mouth at bedtime.   Vitamin D3 5000 units Tabs Take 1 tablet by mouth daily.         Diet and Activity recommendation: See Discharge Instructions above   Consults obtained -ortho   Major procedures and Radiology Reports - PLEASE review detailed and final reports for all details, in brief -     Dg Chest 1 View  Result Date: 10/01/2016 CLINICAL DATA:  Preoperative respiratory exam for right-sided hip fracture. EXAM: CHEST 1  VIEW COMPARISON:  07/03/2013 FINDINGS: The heart size and mediastinal contours are within normal limits. There is no evidence of pulmonary edema, consolidation, pneumothorax, nodule or pleural fluid. The visualized skeletal structures are unremarkable. IMPRESSION: No active disease. Electronically Signed   By: Aletta Edouard M.D.   On: 10/01/2016 09:18   Ct Head Wo Contrast  Result Date: 10/01/2016 CLINICAL DATA:  Patient fell several days ago and has been "scooting" around since that time. The patient is unable to get up and walk. EXAM: CT HEAD WITHOUT CONTRAST CT CERVICAL SPINE WITHOUT CONTRAST TECHNIQUE: Multidetector CT imaging of the head and cervical spine was performed following the standard protocol without intravenous contrast. Multiplanar CT image reconstructions of the cervical spine were also generated. COMPARISON:  MRI of the cervical spine February 10, 2014 FINDINGS: CT HEAD FINDINGS Brain: No  subdural, epidural, or subarachnoid hemorrhage. Cerebellum, brainstem, and basal cisterns are within normal limits. No mass, mass effect, or midline shift. The ventricles and sulci are prominent, likely age related. Moderate white matter changes are seen. No acute cortical ischemia or infarct. Vascular: Calcified atherosclerosis in the intracranial carotid arteries. Skull: Normal. Negative for fracture or focal lesion. Sinuses/Orbits: No acute finding. Other: No other abnormalities. CT CERVICAL SPINE FINDINGS Alignment: There is straightening of normal lordosis. Anterior plate and screws are seen from C3 through C7. Hardware is intact. No traumatic malalignment. Skull base and vertebrae: Evaluation of C3 through C7 is limited due to streak artifact off of surgical hardware. The hardware is intact with no evidence of failure. No fractures are seen through these levels. Mild anterior wedging of T1 is stable since comparison MRI. Mild anterior wedging of T2 is also stable. No fractures are seen through the levels of C1 or C2 either. The alignment at C2-3 is due to mild flexion during the study. No prevertebral soft tissue swelling identified. The pre odontoid space is normal. Soft tissues and spinal canal: No prevertebral fluid or swelling. No visible canal hematoma. Disc levels:  Facet degenerative changes. Upper chest: Emphysematous changes in the lung apices. Other: No other abnormalities. IMPRESSION: 1. No acute intracranial abnormality. 2. Postsurgical changes in the cervical spine with no fracture or traumatic malalignment. Electronically Signed   By: Dorise Bullion III M.D   On: 10/01/2016 09:21   Ct Cervical Spine Wo Contrast  Result Date: 10/01/2016 CLINICAL DATA:  Patient fell several days ago and has been "scooting" around since that time. The patient is unable to get up and walk. EXAM: CT HEAD WITHOUT CONTRAST CT CERVICAL SPINE WITHOUT CONTRAST TECHNIQUE: Multidetector CT imaging of the head and  cervical spine was performed following the standard protocol without intravenous contrast. Multiplanar CT image reconstructions of the cervical spine were also generated. COMPARISON:  MRI of the cervical spine February 10, 2014 FINDINGS: CT HEAD FINDINGS Brain: No subdural, epidural, or subarachnoid hemorrhage. Cerebellum, brainstem, and basal cisterns are within normal limits. No mass, mass effect, or midline shift. The ventricles and sulci are prominent, likely age related. Moderate white matter changes are seen. No acute cortical ischemia or infarct. Vascular: Calcified atherosclerosis in the intracranial carotid arteries. Skull: Normal. Negative for fracture or focal lesion. Sinuses/Orbits: No acute finding. Other: No other abnormalities. CT CERVICAL SPINE FINDINGS Alignment: There is straightening of normal lordosis. Anterior plate and screws are seen from C3 through C7. Hardware is intact. No traumatic malalignment. Skull base and vertebrae: Evaluation of C3 through C7 is limited due to streak artifact off of surgical hardware. The hardware is  intact with no evidence of failure. No fractures are seen through these levels. Mild anterior wedging of T1 is stable since comparison MRI. Mild anterior wedging of T2 is also stable. No fractures are seen through the levels of C1 or C2 either. The alignment at C2-3 is due to mild flexion during the study. No prevertebral soft tissue swelling identified. The pre odontoid space is normal. Soft tissues and spinal canal: No prevertebral fluid or swelling. No visible canal hematoma. Disc levels:  Facet degenerative changes. Upper chest: Emphysematous changes in the lung apices. Other: No other abnormalities. IMPRESSION: 1. No acute intracranial abnormality. 2. Postsurgical changes in the cervical spine with no fracture or traumatic malalignment. Electronically Signed   By: Dorise Bullion III M.D   On: 10/01/2016 09:21   Dg Hip Operative Unilat W Or W/o Pelvis Right  Result  Date: 10/01/2016 CLINICAL DATA:  Right hip fracture repair EXAM: OPERATIVE RIGHT HIP (WITH PELVIS IF PERFORMED) 4 VIEWS TECHNIQUE: Fluoroscopic spot image(s) were submitted for interpretation post-operatively. COMPARISON:  None FLUOROSCOPY TIME:  40 seconds FINDINGS: Intraoperative fluoroscopic spot image demonstrates interval ORIF of a right intertrochanteric fracture. Right proximal intramedullary nail with a cannulated interlocking femoral neck screw transfixes the intertrochanteric fracture. No failure or complication. IMPRESSION: ORIF right intertrochanteric fracture. Electronically Signed   By: Kathreen Devoid   On: 10/01/2016 16:18   Dg Hip Unilat W Or Wo Pelvis 2-3 Views Right  Result Date: 10/01/2016 CLINICAL DATA:  Fall EXAM: DG HIP (WITH OR WITHOUT PELVIS) 2-3V RIGHT COMPARISON:  None FINDINGS: There is an acute and comminuted intertrochanteric fracture involving the proximal right femur. Medial angulation of the distal fracture fragments noted. No dislocation. IMPRESSION: 1. Acute and comminuted intertrochanteric fracture of the proximal right femur. Electronically Signed   By: Kerby Moors M.D.   On: 10/01/2016 09:18    Micro Results    Recent Results (from the past 240 hour(s))  Surgical PCR screen     Status: None   Collection Time: 10/01/16  1:26 PM  Result Value Ref Range Status   MRSA, PCR NEGATIVE NEGATIVE Final   Staphylococcus aureus NEGATIVE NEGATIVE Final    Comment:        The Xpert SA Assay (FDA approved for NASAL specimens in patients over 109 years of age), is one component of a comprehensive surveillance program.  Test performance has been validated by [facility] for patients greater than or equal to 72 year old. It is not intended to diagnose infection nor to guide or monitor treatment.        Today   Subjective:   [name] today Stable for discharge to rehabilitation.  Objective:   Blood pressure (!) 141/82, pulse (!) 123, temperature 98.8 F  (37.1 C), temperature source Oral, resp. rate 18, height 5\' 4"  (1.626 m), weight 52.2 kg (115 lb), SpO2 94 %.   Intake/Output Summary (Last 24 hours) at 10/04/16 1054 Last data filed at 10/04/16 0900  Gross per 24 hour  Intake              600 ml  Output                0 ml  Net              600 ml    Exam Awake Alert, Oriented x 3, No new F.N deficits, Normal affect [place].AT,PERRAL Supple Neck,No JVD, No cervical lymphadenopathy appriciated.  Symmetrical Chest wall movement, Good air movement bilaterally, CTAB RRR,No  Gallops,Rubs or new Murmurs, No Parasternal Heave +ve B.Sounds, Abd Soft, Non tender, No organomegaly appriciated, No rebound -guarding or rigidity. No Cyanosis, Clubbing or edema, No new Rash or bruise  Data Review   CBC w Diff: Lab Results  Component Value Date   WBC 11.5 (H) 10/03/2016   HGB 10.7 (L) 10/03/2016   HCT 31.6 (L) 10/03/2016   PLT 298 10/03/2016   LYMPHOPCT 15 10/03/2016   MONOPCT 7 10/03/2016   EOSPCT 1 10/03/2016   BASOPCT 0 10/03/2016    CMP: Lab Results  Component Value Date   NA 138 10/04/2016   NA 138 02/10/2014   K 4.8 10/04/2016   K 4.5 02/10/2014   CL 101 10/04/2016   CL 106 02/10/2014   CO2 31 10/04/2016   CO2 28 02/10/2014   BUN 9 10/04/2016   BUN 6 (L) 02/10/2014   CREATININE 0.79 10/04/2016   CREATININE 0.89 02/10/2014   PROT 7.1 10/01/2016   PROT 6.7 02/10/2014   ALBUMIN 3.4 (L) 10/01/2016   ALBUMIN 2.8 (L) 02/10/2014   BILITOT 0.8 10/01/2016   BILITOT 0.2 02/10/2014   ALKPHOS 67 10/01/2016   ALKPHOS 126 (H) 02/10/2014   AST 30 10/01/2016   AST 19 02/10/2014   ALT 25 10/01/2016   ALT 15 02/10/2014  .   Total Time in preparing paper work, data evaluation and todays exam - 50 minutes  Felesha Moncrieffe M.D on 10/04/2016 at 10:54 AM    Note: This dictation was prepared with Dragon dictation along with smaller phrase technology. Any transcriptional errors that result from this process are unintentional.

## 2016-10-04 NOTE — Progress Notes (Addendum)
Patient is medically stable for D/C to [facility] today. Per [facility] admissions coordinator at [facility] authorization has been received, patient will go to room 502. RN will call report to 500 hall RN and arrange EMS for transport. Clinical Education officer, museum (CSW) sent D/C orders to [employer] via [place]. Patient is aware of above. CSW contacted patient's mother [name] and made her aware of above. CSW left patient's brother [name] a Advertising account executive. Please reconsult if future social work needs arise. CSW signing off.   Patient's brother [name] called CSW back and is aware of above.   [employer], LCSW [phone]

## 2016-10-04 NOTE — Progress Notes (Signed)
EMS picked up patient. VSS at time of pick up.

## 2016-10-04 NOTE — Progress Notes (Addendum)
Report called to Levada Dy at [facility]. Patient will have someone pick up her plant and flowers that EMS are unable to take. Patient states "someone will be here tomorrow". IV removed and NT will ready patient for transport. EMS called.

## 2016-10-04 NOTE — Progress Notes (Signed)
MEDICATION RELATED CONSULT NOTE - Follow up  Pharmacy Consult for Electrolyte Monitoring Indication: Hypokalemia  No Known Allergies  Patient Measurements: Height: 5\' 4"  (162.6 cm) Weight: 115 lb (52.2 kg) IBW/kg (Calculated) : 54.7   Vital Signs: Temp: 98.8 F (37.1 C) (11/15 0752) Temp Source: Oral (11/15 0752) BP: 141/82 (11/15 0752) Pulse Rate: 123 (11/15 0752) Intake/Output from previous day: 11/14 0701 - 11/15 0700 In: 120 [P.O.:120] Out: -  Intake/Output from this shift: Total I/O In: 480 [P.O.:480] Out: -   Labs:  Recent Labs  10/02/16 0413 10/03/16 0452 10/04/16 0451  WBC 8.0 11.5*  --   HGB 10.5* 10.7*  --   HCT 31.7* 31.6*  --   PLT 255 298  --   CREATININE 0.67 0.77 0.79  MG 2.0 1.7 1.9  PHOS 2.9  --   --    Estimated Creatinine Clearance: 54.7 mL/min (by C-G formula based on SCr of 0.79 mg/dL).   Assessment: 70 y/o F with a h/o CAD and COPD admitted with hip fracture, hypokalemia. Pharmacy consulted to assist in managing electrolytes.   K 4.8, Mag 1.9  Plan:  K and Mag WNL today. No supplementation needed at this time.   Pharmacy will continue to follow.  Rayna Sexton L 10/04/2016,11:39 AM

## 2016-10-04 NOTE — Discharge Instructions (Signed)
-  Lovenox 40mg  daily for 14 days. -Follow-up with [facility] in 10-14 days for staple removal.

## 2016-10-06 DIAGNOSIS — I2510 Atherosclerotic heart disease of native coronary artery without angina pectoris: Secondary | ICD-10-CM | POA: Diagnosis not present

## 2016-10-06 DIAGNOSIS — K5900 Constipation, unspecified: Secondary | ICD-10-CM | POA: Diagnosis not present

## 2016-10-06 DIAGNOSIS — J449 Chronic obstructive pulmonary disease, unspecified: Secondary | ICD-10-CM | POA: Diagnosis not present

## 2016-10-06 DIAGNOSIS — M810 Age-related osteoporosis without current pathological fracture: Secondary | ICD-10-CM | POA: Diagnosis not present

## 2016-10-18 DIAGNOSIS — G8929 Other chronic pain: Secondary | ICD-10-CM | POA: Diagnosis not present

## 2016-10-18 DIAGNOSIS — I2510 Atherosclerotic heart disease of native coronary artery without angina pectoris: Secondary | ICD-10-CM | POA: Diagnosis not present

## 2016-10-18 DIAGNOSIS — E039 Hypothyroidism, unspecified: Secondary | ICD-10-CM | POA: Diagnosis not present

## 2016-10-18 DIAGNOSIS — J449 Chronic obstructive pulmonary disease, unspecified: Secondary | ICD-10-CM | POA: Diagnosis not present

## 2016-10-18 DIAGNOSIS — Z9181 History of falling: Secondary | ICD-10-CM | POA: Diagnosis not present

## 2016-10-18 DIAGNOSIS — L89320 Pressure ulcer of left buttock, unstageable: Secondary | ICD-10-CM | POA: Diagnosis not present

## 2016-10-18 DIAGNOSIS — S72141D Displaced intertrochanteric fracture of right femur, subsequent encounter for closed fracture with routine healing: Secondary | ICD-10-CM | POA: Diagnosis not present

## 2016-10-18 DIAGNOSIS — R69 Illness, unspecified: Secondary | ICD-10-CM | POA: Diagnosis not present

## 2016-10-18 DIAGNOSIS — R2689 Other abnormalities of gait and mobility: Secondary | ICD-10-CM | POA: Diagnosis not present

## 2016-10-20 DIAGNOSIS — J449 Chronic obstructive pulmonary disease, unspecified: Secondary | ICD-10-CM | POA: Diagnosis not present

## 2016-10-20 DIAGNOSIS — G8929 Other chronic pain: Secondary | ICD-10-CM | POA: Diagnosis not present

## 2016-10-20 DIAGNOSIS — Z9181 History of falling: Secondary | ICD-10-CM | POA: Diagnosis not present

## 2016-10-20 DIAGNOSIS — E039 Hypothyroidism, unspecified: Secondary | ICD-10-CM | POA: Diagnosis not present

## 2016-10-20 DIAGNOSIS — I2510 Atherosclerotic heart disease of native coronary artery without angina pectoris: Secondary | ICD-10-CM | POA: Diagnosis not present

## 2016-10-20 DIAGNOSIS — R69 Illness, unspecified: Secondary | ICD-10-CM | POA: Diagnosis not present

## 2016-10-20 DIAGNOSIS — L89320 Pressure ulcer of left buttock, unstageable: Secondary | ICD-10-CM | POA: Diagnosis not present

## 2016-10-20 DIAGNOSIS — S72141D Displaced intertrochanteric fracture of right femur, subsequent encounter for closed fracture with routine healing: Secondary | ICD-10-CM | POA: Diagnosis not present

## 2016-10-24 DIAGNOSIS — J449 Chronic obstructive pulmonary disease, unspecified: Secondary | ICD-10-CM | POA: Diagnosis not present

## 2016-10-24 DIAGNOSIS — I2510 Atherosclerotic heart disease of native coronary artery without angina pectoris: Secondary | ICD-10-CM | POA: Diagnosis not present

## 2016-10-24 DIAGNOSIS — Z8781 Personal history of (healed) traumatic fracture: Secondary | ICD-10-CM | POA: Diagnosis not present

## 2016-10-24 DIAGNOSIS — R69 Illness, unspecified: Secondary | ICD-10-CM | POA: Diagnosis not present

## 2016-10-24 DIAGNOSIS — Z967 Presence of other bone and tendon implants: Secondary | ICD-10-CM | POA: Diagnosis not present

## 2016-10-24 DIAGNOSIS — E039 Hypothyroidism, unspecified: Secondary | ICD-10-CM | POA: Diagnosis not present

## 2016-10-24 DIAGNOSIS — S72141D Displaced intertrochanteric fracture of right femur, subsequent encounter for closed fracture with routine healing: Secondary | ICD-10-CM | POA: Diagnosis not present

## 2016-10-24 DIAGNOSIS — L89320 Pressure ulcer of left buttock, unstageable: Secondary | ICD-10-CM | POA: Diagnosis not present

## 2016-10-24 DIAGNOSIS — Z9181 History of falling: Secondary | ICD-10-CM | POA: Diagnosis not present

## 2016-10-24 DIAGNOSIS — G8929 Other chronic pain: Secondary | ICD-10-CM | POA: Diagnosis not present

## 2016-10-26 DIAGNOSIS — R69 Illness, unspecified: Secondary | ICD-10-CM | POA: Diagnosis not present

## 2016-10-26 DIAGNOSIS — J449 Chronic obstructive pulmonary disease, unspecified: Secondary | ICD-10-CM | POA: Diagnosis not present

## 2016-10-26 DIAGNOSIS — G8929 Other chronic pain: Secondary | ICD-10-CM | POA: Diagnosis not present

## 2016-10-26 DIAGNOSIS — L89320 Pressure ulcer of left buttock, unstageable: Secondary | ICD-10-CM | POA: Diagnosis not present

## 2016-10-26 DIAGNOSIS — Z9181 History of falling: Secondary | ICD-10-CM | POA: Diagnosis not present

## 2016-10-26 DIAGNOSIS — S72141D Displaced intertrochanteric fracture of right femur, subsequent encounter for closed fracture with routine healing: Secondary | ICD-10-CM | POA: Diagnosis not present

## 2016-10-26 DIAGNOSIS — E039 Hypothyroidism, unspecified: Secondary | ICD-10-CM | POA: Diagnosis not present

## 2016-10-26 DIAGNOSIS — I2510 Atherosclerotic heart disease of native coronary artery without angina pectoris: Secondary | ICD-10-CM | POA: Diagnosis not present

## 2016-10-27 DIAGNOSIS — L89320 Pressure ulcer of left buttock, unstageable: Secondary | ICD-10-CM | POA: Diagnosis not present

## 2016-10-27 DIAGNOSIS — E039 Hypothyroidism, unspecified: Secondary | ICD-10-CM | POA: Diagnosis not present

## 2016-10-27 DIAGNOSIS — R69 Illness, unspecified: Secondary | ICD-10-CM | POA: Diagnosis not present

## 2016-10-27 DIAGNOSIS — Z9181 History of falling: Secondary | ICD-10-CM | POA: Diagnosis not present

## 2016-10-27 DIAGNOSIS — G8929 Other chronic pain: Secondary | ICD-10-CM | POA: Diagnosis not present

## 2016-10-27 DIAGNOSIS — S72141D Displaced intertrochanteric fracture of right femur, subsequent encounter for closed fracture with routine healing: Secondary | ICD-10-CM | POA: Diagnosis not present

## 2016-10-27 DIAGNOSIS — J449 Chronic obstructive pulmonary disease, unspecified: Secondary | ICD-10-CM | POA: Diagnosis not present

## 2016-10-27 DIAGNOSIS — I2510 Atherosclerotic heart disease of native coronary artery without angina pectoris: Secondary | ICD-10-CM | POA: Diagnosis not present

## 2016-10-30 DIAGNOSIS — I2510 Atherosclerotic heart disease of native coronary artery without angina pectoris: Secondary | ICD-10-CM | POA: Diagnosis not present

## 2016-10-30 DIAGNOSIS — R69 Illness, unspecified: Secondary | ICD-10-CM | POA: Diagnosis not present

## 2016-10-30 DIAGNOSIS — L89320 Pressure ulcer of left buttock, unstageable: Secondary | ICD-10-CM | POA: Diagnosis not present

## 2016-10-30 DIAGNOSIS — E039 Hypothyroidism, unspecified: Secondary | ICD-10-CM | POA: Diagnosis not present

## 2016-10-30 DIAGNOSIS — Z9181 History of falling: Secondary | ICD-10-CM | POA: Diagnosis not present

## 2016-10-30 DIAGNOSIS — S72141D Displaced intertrochanteric fracture of right femur, subsequent encounter for closed fracture with routine healing: Secondary | ICD-10-CM | POA: Diagnosis not present

## 2016-10-30 DIAGNOSIS — J449 Chronic obstructive pulmonary disease, unspecified: Secondary | ICD-10-CM | POA: Diagnosis not present

## 2016-10-30 DIAGNOSIS — G8929 Other chronic pain: Secondary | ICD-10-CM | POA: Diagnosis not present

## 2016-11-01 ENCOUNTER — Encounter: Payer: Medicare HMO | Admitting: Pain Medicine

## 2016-11-01 ENCOUNTER — Telehealth: Payer: Self-pay | Admitting: Pain Medicine

## 2016-11-01 DIAGNOSIS — S72141D Displaced intertrochanteric fracture of right femur, subsequent encounter for closed fracture with routine healing: Secondary | ICD-10-CM | POA: Diagnosis not present

## 2016-11-01 DIAGNOSIS — I2510 Atherosclerotic heart disease of native coronary artery without angina pectoris: Secondary | ICD-10-CM | POA: Diagnosis not present

## 2016-11-01 DIAGNOSIS — L89320 Pressure ulcer of left buttock, unstageable: Secondary | ICD-10-CM | POA: Diagnosis not present

## 2016-11-01 DIAGNOSIS — J449 Chronic obstructive pulmonary disease, unspecified: Secondary | ICD-10-CM | POA: Diagnosis not present

## 2016-11-01 DIAGNOSIS — E039 Hypothyroidism, unspecified: Secondary | ICD-10-CM | POA: Diagnosis not present

## 2016-11-01 DIAGNOSIS — R69 Illness, unspecified: Secondary | ICD-10-CM | POA: Diagnosis not present

## 2016-11-01 DIAGNOSIS — G8929 Other chronic pain: Secondary | ICD-10-CM | POA: Diagnosis not present

## 2016-11-01 DIAGNOSIS — Z9181 History of falling: Secondary | ICD-10-CM | POA: Diagnosis not present

## 2016-11-01 NOTE — Telephone Encounter (Signed)
Broke her heel and had to have surgery and was in hospital for a month, was given 2 pain meds while in hospital and now only has one from surgeon, but has script from dr Dossie Arbour to fill, wants to know if she can have this filled and take it with the one surgeon gave her. They gave her both while in hospital, please call patient.

## 2016-11-01 NOTE — Telephone Encounter (Signed)
Patient called and instructed not to fill or take any Dr. Dossie Arbour prescriptions. She will need to follow her surgeons instructions re pain medications and scripts. Pateint will need follow up appointment before any further discussion re: pain medications . She is being folloeed by Dr. Rosanne Sack.

## 2016-11-06 DIAGNOSIS — E039 Hypothyroidism, unspecified: Secondary | ICD-10-CM | POA: Diagnosis not present

## 2016-11-06 DIAGNOSIS — J449 Chronic obstructive pulmonary disease, unspecified: Secondary | ICD-10-CM | POA: Diagnosis not present

## 2016-11-06 DIAGNOSIS — Z9181 History of falling: Secondary | ICD-10-CM | POA: Diagnosis not present

## 2016-11-06 DIAGNOSIS — S72141D Displaced intertrochanteric fracture of right femur, subsequent encounter for closed fracture with routine healing: Secondary | ICD-10-CM | POA: Diagnosis not present

## 2016-11-06 DIAGNOSIS — L89320 Pressure ulcer of left buttock, unstageable: Secondary | ICD-10-CM | POA: Diagnosis not present

## 2016-11-06 DIAGNOSIS — G8929 Other chronic pain: Secondary | ICD-10-CM | POA: Diagnosis not present

## 2016-11-06 DIAGNOSIS — I2510 Atherosclerotic heart disease of native coronary artery without angina pectoris: Secondary | ICD-10-CM | POA: Diagnosis not present

## 2016-11-06 DIAGNOSIS — R69 Illness, unspecified: Secondary | ICD-10-CM | POA: Diagnosis not present

## 2016-11-07 ENCOUNTER — Ambulatory Visit: Payer: Medicare HMO | Admitting: Pain Medicine

## 2016-11-07 DIAGNOSIS — I2510 Atherosclerotic heart disease of native coronary artery without angina pectoris: Secondary | ICD-10-CM | POA: Diagnosis not present

## 2016-11-07 DIAGNOSIS — Z9181 History of falling: Secondary | ICD-10-CM | POA: Diagnosis not present

## 2016-11-07 DIAGNOSIS — E039 Hypothyroidism, unspecified: Secondary | ICD-10-CM | POA: Diagnosis not present

## 2016-11-07 DIAGNOSIS — S72141D Displaced intertrochanteric fracture of right femur, subsequent encounter for closed fracture with routine healing: Secondary | ICD-10-CM | POA: Diagnosis not present

## 2016-11-07 DIAGNOSIS — J449 Chronic obstructive pulmonary disease, unspecified: Secondary | ICD-10-CM | POA: Diagnosis not present

## 2016-11-07 DIAGNOSIS — R69 Illness, unspecified: Secondary | ICD-10-CM | POA: Diagnosis not present

## 2016-11-07 DIAGNOSIS — L89320 Pressure ulcer of left buttock, unstageable: Secondary | ICD-10-CM | POA: Diagnosis not present

## 2016-11-07 DIAGNOSIS — G8929 Other chronic pain: Secondary | ICD-10-CM | POA: Diagnosis not present

## 2016-11-08 DIAGNOSIS — J449 Chronic obstructive pulmonary disease, unspecified: Secondary | ICD-10-CM | POA: Diagnosis not present

## 2016-11-08 DIAGNOSIS — I2510 Atherosclerotic heart disease of native coronary artery without angina pectoris: Secondary | ICD-10-CM | POA: Diagnosis not present

## 2016-11-08 DIAGNOSIS — R69 Illness, unspecified: Secondary | ICD-10-CM | POA: Diagnosis not present

## 2016-11-08 DIAGNOSIS — S72141D Displaced intertrochanteric fracture of right femur, subsequent encounter for closed fracture with routine healing: Secondary | ICD-10-CM | POA: Diagnosis not present

## 2016-11-08 DIAGNOSIS — Z9181 History of falling: Secondary | ICD-10-CM | POA: Diagnosis not present

## 2016-11-08 DIAGNOSIS — L89320 Pressure ulcer of left buttock, unstageable: Secondary | ICD-10-CM | POA: Diagnosis not present

## 2016-11-08 DIAGNOSIS — G8929 Other chronic pain: Secondary | ICD-10-CM | POA: Diagnosis not present

## 2016-11-08 DIAGNOSIS — E039 Hypothyroidism, unspecified: Secondary | ICD-10-CM | POA: Diagnosis not present

## 2016-11-10 DIAGNOSIS — L89320 Pressure ulcer of left buttock, unstageable: Secondary | ICD-10-CM | POA: Diagnosis not present

## 2016-11-10 DIAGNOSIS — Z9181 History of falling: Secondary | ICD-10-CM | POA: Diagnosis not present

## 2016-11-10 DIAGNOSIS — E039 Hypothyroidism, unspecified: Secondary | ICD-10-CM | POA: Diagnosis not present

## 2016-11-10 DIAGNOSIS — R69 Illness, unspecified: Secondary | ICD-10-CM | POA: Diagnosis not present

## 2016-11-10 DIAGNOSIS — J449 Chronic obstructive pulmonary disease, unspecified: Secondary | ICD-10-CM | POA: Diagnosis not present

## 2016-11-10 DIAGNOSIS — G8929 Other chronic pain: Secondary | ICD-10-CM | POA: Diagnosis not present

## 2016-11-10 DIAGNOSIS — S72141D Displaced intertrochanteric fracture of right femur, subsequent encounter for closed fracture with routine healing: Secondary | ICD-10-CM | POA: Diagnosis not present

## 2016-11-10 DIAGNOSIS — I2510 Atherosclerotic heart disease of native coronary artery without angina pectoris: Secondary | ICD-10-CM | POA: Diagnosis not present

## 2016-11-14 DIAGNOSIS — L89320 Pressure ulcer of left buttock, unstageable: Secondary | ICD-10-CM | POA: Diagnosis not present

## 2016-11-14 DIAGNOSIS — I2510 Atherosclerotic heart disease of native coronary artery without angina pectoris: Secondary | ICD-10-CM | POA: Diagnosis not present

## 2016-11-14 DIAGNOSIS — S72141D Displaced intertrochanteric fracture of right femur, subsequent encounter for closed fracture with routine healing: Secondary | ICD-10-CM | POA: Diagnosis not present

## 2016-11-14 DIAGNOSIS — E039 Hypothyroidism, unspecified: Secondary | ICD-10-CM | POA: Diagnosis not present

## 2016-11-14 DIAGNOSIS — G8929 Other chronic pain: Secondary | ICD-10-CM | POA: Diagnosis not present

## 2016-11-14 DIAGNOSIS — J449 Chronic obstructive pulmonary disease, unspecified: Secondary | ICD-10-CM | POA: Diagnosis not present

## 2016-11-14 DIAGNOSIS — Z9181 History of falling: Secondary | ICD-10-CM | POA: Diagnosis not present

## 2016-11-14 DIAGNOSIS — R69 Illness, unspecified: Secondary | ICD-10-CM | POA: Diagnosis not present

## 2016-11-16 DIAGNOSIS — R69 Illness, unspecified: Secondary | ICD-10-CM | POA: Diagnosis not present

## 2016-11-16 DIAGNOSIS — L89320 Pressure ulcer of left buttock, unstageable: Secondary | ICD-10-CM | POA: Diagnosis not present

## 2016-11-16 DIAGNOSIS — G8929 Other chronic pain: Secondary | ICD-10-CM | POA: Diagnosis not present

## 2016-11-16 DIAGNOSIS — J449 Chronic obstructive pulmonary disease, unspecified: Secondary | ICD-10-CM | POA: Diagnosis not present

## 2016-11-16 DIAGNOSIS — Z9181 History of falling: Secondary | ICD-10-CM | POA: Diagnosis not present

## 2016-11-16 DIAGNOSIS — S72141D Displaced intertrochanteric fracture of right femur, subsequent encounter for closed fracture with routine healing: Secondary | ICD-10-CM | POA: Diagnosis not present

## 2016-11-16 DIAGNOSIS — I2510 Atherosclerotic heart disease of native coronary artery without angina pectoris: Secondary | ICD-10-CM | POA: Diagnosis not present

## 2016-11-16 DIAGNOSIS — E039 Hypothyroidism, unspecified: Secondary | ICD-10-CM | POA: Diagnosis not present

## 2016-11-17 DIAGNOSIS — R69 Illness, unspecified: Secondary | ICD-10-CM | POA: Diagnosis not present

## 2016-11-17 DIAGNOSIS — G8929 Other chronic pain: Secondary | ICD-10-CM | POA: Diagnosis not present

## 2016-11-17 DIAGNOSIS — Z9181 History of falling: Secondary | ICD-10-CM | POA: Diagnosis not present

## 2016-11-17 DIAGNOSIS — S72141D Displaced intertrochanteric fracture of right femur, subsequent encounter for closed fracture with routine healing: Secondary | ICD-10-CM | POA: Diagnosis not present

## 2016-11-17 DIAGNOSIS — L89320 Pressure ulcer of left buttock, unstageable: Secondary | ICD-10-CM | POA: Diagnosis not present

## 2016-11-17 DIAGNOSIS — J449 Chronic obstructive pulmonary disease, unspecified: Secondary | ICD-10-CM | POA: Diagnosis not present

## 2016-11-17 DIAGNOSIS — E039 Hypothyroidism, unspecified: Secondary | ICD-10-CM | POA: Diagnosis not present

## 2016-11-17 DIAGNOSIS — I2510 Atherosclerotic heart disease of native coronary artery without angina pectoris: Secondary | ICD-10-CM | POA: Diagnosis not present

## 2016-11-18 DIAGNOSIS — E039 Hypothyroidism, unspecified: Secondary | ICD-10-CM | POA: Diagnosis not present

## 2016-11-18 DIAGNOSIS — I2510 Atherosclerotic heart disease of native coronary artery without angina pectoris: Secondary | ICD-10-CM | POA: Diagnosis not present

## 2016-11-18 DIAGNOSIS — G8929 Other chronic pain: Secondary | ICD-10-CM | POA: Diagnosis not present

## 2016-11-18 DIAGNOSIS — R69 Illness, unspecified: Secondary | ICD-10-CM | POA: Diagnosis not present

## 2016-11-18 DIAGNOSIS — L89320 Pressure ulcer of left buttock, unstageable: Secondary | ICD-10-CM | POA: Diagnosis not present

## 2016-11-18 DIAGNOSIS — S72141D Displaced intertrochanteric fracture of right femur, subsequent encounter for closed fracture with routine healing: Secondary | ICD-10-CM | POA: Diagnosis not present

## 2016-11-18 DIAGNOSIS — J449 Chronic obstructive pulmonary disease, unspecified: Secondary | ICD-10-CM | POA: Diagnosis not present

## 2016-11-18 DIAGNOSIS — Z9181 History of falling: Secondary | ICD-10-CM | POA: Diagnosis not present

## 2016-11-21 ENCOUNTER — Telehealth: Payer: Self-pay

## 2016-11-21 DIAGNOSIS — R69 Illness, unspecified: Secondary | ICD-10-CM | POA: Diagnosis not present

## 2016-11-21 DIAGNOSIS — S72141D Displaced intertrochanteric fracture of right femur, subsequent encounter for closed fracture with routine healing: Secondary | ICD-10-CM | POA: Diagnosis not present

## 2016-11-21 DIAGNOSIS — L89320 Pressure ulcer of left buttock, unstageable: Secondary | ICD-10-CM | POA: Diagnosis not present

## 2016-11-21 DIAGNOSIS — J449 Chronic obstructive pulmonary disease, unspecified: Secondary | ICD-10-CM | POA: Diagnosis not present

## 2016-11-21 DIAGNOSIS — G8929 Other chronic pain: Secondary | ICD-10-CM | POA: Diagnosis not present

## 2016-11-21 DIAGNOSIS — E039 Hypothyroidism, unspecified: Secondary | ICD-10-CM | POA: Diagnosis not present

## 2016-11-21 DIAGNOSIS — I2510 Atherosclerotic heart disease of native coronary artery without angina pectoris: Secondary | ICD-10-CM | POA: Diagnosis not present

## 2016-11-21 DIAGNOSIS — Z9181 History of falling: Secondary | ICD-10-CM | POA: Diagnosis not present

## 2016-11-21 NOTE — Telephone Encounter (Signed)
She is about to be out of pain meds and wants to know if her nurse can come in and pick up a script. She fell and broke her hand and says she is in a lot of pain and cant get up here herself. Please call her back

## 2016-11-21 NOTE — Telephone Encounter (Signed)
Patient had surgery November 12th .   Went to [facility] for [facility].  Patient states she was given pain meds at hospital and [facility].  Patient states she did not take Dr Cleda Daub medicine while in hospital.  Patients pain meds were to last till !-10-18.  Patient states she has 3 pills left.    Patient has an appointment on 12-07-16.  @ previous appojntments were cancelled due to surgery. Will discuss with Dr Dossie Arbour

## 2016-11-22 DIAGNOSIS — L89320 Pressure ulcer of left buttock, unstageable: Secondary | ICD-10-CM | POA: Diagnosis not present

## 2016-11-22 DIAGNOSIS — I2510 Atherosclerotic heart disease of native coronary artery without angina pectoris: Secondary | ICD-10-CM | POA: Diagnosis not present

## 2016-11-22 DIAGNOSIS — E039 Hypothyroidism, unspecified: Secondary | ICD-10-CM | POA: Diagnosis not present

## 2016-11-22 DIAGNOSIS — S72141D Displaced intertrochanteric fracture of right femur, subsequent encounter for closed fracture with routine healing: Secondary | ICD-10-CM | POA: Diagnosis not present

## 2016-11-22 DIAGNOSIS — Z9181 History of falling: Secondary | ICD-10-CM | POA: Diagnosis not present

## 2016-11-22 DIAGNOSIS — J449 Chronic obstructive pulmonary disease, unspecified: Secondary | ICD-10-CM | POA: Diagnosis not present

## 2016-11-22 DIAGNOSIS — R69 Illness, unspecified: Secondary | ICD-10-CM | POA: Diagnosis not present

## 2016-11-22 DIAGNOSIS — G8929 Other chronic pain: Secondary | ICD-10-CM | POA: Diagnosis not present

## 2016-11-22 NOTE — Telephone Encounter (Signed)
Does not appear from PMP that patient has filled last prescription. Patient notified that she had not filled the prescription that was due to be filled on 10-30-16.  Patient states she will look for it.

## 2016-11-23 DIAGNOSIS — Z9181 History of falling: Secondary | ICD-10-CM | POA: Diagnosis not present

## 2016-11-23 DIAGNOSIS — J449 Chronic obstructive pulmonary disease, unspecified: Secondary | ICD-10-CM | POA: Diagnosis not present

## 2016-11-23 DIAGNOSIS — E039 Hypothyroidism, unspecified: Secondary | ICD-10-CM | POA: Diagnosis not present

## 2016-11-23 DIAGNOSIS — I2510 Atherosclerotic heart disease of native coronary artery without angina pectoris: Secondary | ICD-10-CM | POA: Diagnosis not present

## 2016-11-23 DIAGNOSIS — G8929 Other chronic pain: Secondary | ICD-10-CM | POA: Diagnosis not present

## 2016-11-23 DIAGNOSIS — S72141D Displaced intertrochanteric fracture of right femur, subsequent encounter for closed fracture with routine healing: Secondary | ICD-10-CM | POA: Diagnosis not present

## 2016-11-23 DIAGNOSIS — R69 Illness, unspecified: Secondary | ICD-10-CM | POA: Diagnosis not present

## 2016-11-23 DIAGNOSIS — L89320 Pressure ulcer of left buttock, unstageable: Secondary | ICD-10-CM | POA: Diagnosis not present

## 2016-11-24 DIAGNOSIS — E039 Hypothyroidism, unspecified: Secondary | ICD-10-CM | POA: Diagnosis not present

## 2016-11-24 DIAGNOSIS — R69 Illness, unspecified: Secondary | ICD-10-CM | POA: Diagnosis not present

## 2016-11-24 DIAGNOSIS — I2510 Atherosclerotic heart disease of native coronary artery without angina pectoris: Secondary | ICD-10-CM | POA: Diagnosis not present

## 2016-11-24 DIAGNOSIS — G8929 Other chronic pain: Secondary | ICD-10-CM | POA: Diagnosis not present

## 2016-11-24 DIAGNOSIS — L89320 Pressure ulcer of left buttock, unstageable: Secondary | ICD-10-CM | POA: Diagnosis not present

## 2016-11-24 DIAGNOSIS — S72141D Displaced intertrochanteric fracture of right femur, subsequent encounter for closed fracture with routine healing: Secondary | ICD-10-CM | POA: Diagnosis not present

## 2016-11-24 DIAGNOSIS — Z9181 History of falling: Secondary | ICD-10-CM | POA: Diagnosis not present

## 2016-11-24 DIAGNOSIS — J449 Chronic obstructive pulmonary disease, unspecified: Secondary | ICD-10-CM | POA: Diagnosis not present

## 2016-11-25 DIAGNOSIS — G8929 Other chronic pain: Secondary | ICD-10-CM | POA: Diagnosis not present

## 2016-11-25 DIAGNOSIS — I2510 Atherosclerotic heart disease of native coronary artery without angina pectoris: Secondary | ICD-10-CM | POA: Diagnosis not present

## 2016-11-25 DIAGNOSIS — J449 Chronic obstructive pulmonary disease, unspecified: Secondary | ICD-10-CM | POA: Diagnosis not present

## 2016-11-25 DIAGNOSIS — Z9181 History of falling: Secondary | ICD-10-CM | POA: Diagnosis not present

## 2016-11-25 DIAGNOSIS — L89320 Pressure ulcer of left buttock, unstageable: Secondary | ICD-10-CM | POA: Diagnosis not present

## 2016-11-25 DIAGNOSIS — E039 Hypothyroidism, unspecified: Secondary | ICD-10-CM | POA: Diagnosis not present

## 2016-11-25 DIAGNOSIS — S72141D Displaced intertrochanteric fracture of right femur, subsequent encounter for closed fracture with routine healing: Secondary | ICD-10-CM | POA: Diagnosis not present

## 2016-11-25 DIAGNOSIS — R69 Illness, unspecified: Secondary | ICD-10-CM | POA: Diagnosis not present

## 2016-11-28 ENCOUNTER — Encounter: Payer: Self-pay | Admitting: Pain Medicine

## 2016-11-28 ENCOUNTER — Other Ambulatory Visit
Admission: RE | Admit: 2016-11-28 | Discharge: 2016-11-28 | Disposition: A | Discharge status: Home / Self Care | Payer: Medicare HMO | Source: Ambulatory Visit | Attending: Pain Medicine | Admitting: Pain Medicine

## 2016-11-28 ENCOUNTER — Ambulatory Visit: Payer: Medicare HMO | Attending: Pain Medicine | Admitting: Pain Medicine

## 2016-11-28 VITALS — BP 142/78 | HR 92 | Temp 98.5°F | Resp 16 | Ht 64.0 in | Wt 115.0 lb

## 2016-11-28 DIAGNOSIS — M791 Myalgia: Secondary | ICD-10-CM | POA: Diagnosis not present

## 2016-11-28 DIAGNOSIS — Z79899 Other long term (current) drug therapy: Secondary | ICD-10-CM | POA: Diagnosis not present

## 2016-11-28 DIAGNOSIS — M545 Low back pain: Secondary | ICD-10-CM | POA: Diagnosis not present

## 2016-11-28 DIAGNOSIS — M542 Cervicalgia: Secondary | ICD-10-CM | POA: Diagnosis not present

## 2016-11-28 DIAGNOSIS — M961 Postlaminectomy syndrome, not elsewhere classified: Secondary | ICD-10-CM | POA: Diagnosis not present

## 2016-11-28 DIAGNOSIS — M1288 Other specific arthropathies, not elsewhere classified, other specified site: Secondary | ICD-10-CM

## 2016-11-28 DIAGNOSIS — E039 Hypothyroidism, unspecified: Secondary | ICD-10-CM | POA: Diagnosis not present

## 2016-11-28 DIAGNOSIS — M488X6 Other specified spondylopathies, lumbar region: Secondary | ICD-10-CM | POA: Diagnosis not present

## 2016-11-28 DIAGNOSIS — F17210 Nicotine dependence, cigarettes, uncomplicated: Secondary | ICD-10-CM | POA: Insufficient documentation

## 2016-11-28 DIAGNOSIS — E782 Mixed hyperlipidemia: Secondary | ICD-10-CM | POA: Insufficient documentation

## 2016-11-28 DIAGNOSIS — J449 Chronic obstructive pulmonary disease, unspecified: Secondary | ICD-10-CM | POA: Insufficient documentation

## 2016-11-28 DIAGNOSIS — M4316 Spondylolisthesis, lumbar region: Secondary | ICD-10-CM | POA: Insufficient documentation

## 2016-11-28 DIAGNOSIS — G8929 Other chronic pain: Secondary | ICD-10-CM

## 2016-11-28 DIAGNOSIS — M47816 Spondylosis without myelopathy or radiculopathy, lumbar region: Secondary | ICD-10-CM

## 2016-11-28 DIAGNOSIS — S72141D Displaced intertrochanteric fracture of right femur, subsequent encounter for closed fracture with routine healing: Secondary | ICD-10-CM | POA: Diagnosis not present

## 2016-11-28 DIAGNOSIS — F1990 Other psychoactive substance use, unspecified, uncomplicated: Secondary | ICD-10-CM

## 2016-11-28 DIAGNOSIS — F419 Anxiety disorder, unspecified: Secondary | ICD-10-CM | POA: Insufficient documentation

## 2016-11-28 DIAGNOSIS — Z79891 Long term (current) use of opiate analgesic: Secondary | ICD-10-CM | POA: Diagnosis not present

## 2016-11-28 DIAGNOSIS — M47892 Other spondylosis, cervical region: Secondary | ICD-10-CM | POA: Diagnosis not present

## 2016-11-28 DIAGNOSIS — R69 Illness, unspecified: Secondary | ICD-10-CM | POA: Diagnosis not present

## 2016-11-28 DIAGNOSIS — K219 Gastro-esophageal reflux disease without esophagitis: Secondary | ICD-10-CM | POA: Diagnosis not present

## 2016-11-28 DIAGNOSIS — M25512 Pain in left shoulder: Secondary | ICD-10-CM | POA: Insufficient documentation

## 2016-11-28 DIAGNOSIS — Z5181 Encounter for therapeutic drug level monitoring: Secondary | ICD-10-CM | POA: Insufficient documentation

## 2016-11-28 DIAGNOSIS — F329 Major depressive disorder, single episode, unspecified: Secondary | ICD-10-CM | POA: Insufficient documentation

## 2016-11-28 DIAGNOSIS — G894 Chronic pain syndrome: Secondary | ICD-10-CM | POA: Insufficient documentation

## 2016-11-28 DIAGNOSIS — M48061 Spinal stenosis, lumbar region without neurogenic claudication: Secondary | ICD-10-CM | POA: Diagnosis not present

## 2016-11-28 DIAGNOSIS — F1190 Opioid use, unspecified, uncomplicated: Secondary | ICD-10-CM

## 2016-11-28 DIAGNOSIS — Z9181 History of falling: Secondary | ICD-10-CM | POA: Diagnosis not present

## 2016-11-28 DIAGNOSIS — L89320 Pressure ulcer of left buttock, unstageable: Secondary | ICD-10-CM | POA: Diagnosis not present

## 2016-11-28 DIAGNOSIS — M488X2 Other specified spondylopathies, cervical region: Secondary | ICD-10-CM | POA: Diagnosis not present

## 2016-11-28 DIAGNOSIS — Z981 Arthrodesis status: Secondary | ICD-10-CM | POA: Insufficient documentation

## 2016-11-28 DIAGNOSIS — I2510 Atherosclerotic heart disease of native coronary artery without angina pectoris: Secondary | ICD-10-CM | POA: Diagnosis not present

## 2016-11-28 DIAGNOSIS — M7541 Impingement syndrome of right shoulder: Secondary | ICD-10-CM | POA: Diagnosis not present

## 2016-11-28 DIAGNOSIS — M7918 Myalgia, other site: Secondary | ICD-10-CM

## 2016-11-28 DIAGNOSIS — M47812 Spondylosis without myelopathy or radiculopathy, cervical region: Secondary | ICD-10-CM

## 2016-11-28 LAB — VITAMIN B12: VITAMIN B 12: 179 pg/mL — AB (ref 180–914)

## 2016-11-28 MED ORDER — MORPHINE SULFATE ER 30 MG PO TBCR: 30.0000 mg | EXTENDED_RELEASE_TABLET | Freq: Two times a day (BID) | ORAL | 0 refills | Status: DC

## 2016-11-28 MED ORDER — TIZANIDINE HCL 2 MG PO CAPS: 2.0000 mg | ORAL_CAPSULE | Freq: Three times a day (TID) | ORAL | 2 refills | Status: DC | PRN

## 2016-11-28 NOTE — Progress Notes (Addendum)
Patient's Name: [name]  MRN: [id number]  Referring Provider: Albina Billet, MD  DOB: 1946-03-06  PCP: Albina Billet, MD  DOS: 11/28/2016  Note by: Kathlen Brunswick. Dossie Arbour, MD  Service setting: Ambulatory outpatient  Specialty: Interventional Pain Management  Location: [facility] (AMB) Pain Management Facility    Patient type: Established   Primary Reason(s) for Visit: Encounter for prescription drug management (Level of risk: moderate) CC: Neck Pain and Shoulder Pain (bilateral)  HPI  [name] is a 71 y.o. year old, female patient, who comes today for a medication management evaluation. 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); Cervical facet syndrome (Location of Secondary source of pain) (Bilateral) (R>L); Cervical myofascial pain syndrome; Chronic low back pain (Location of Primary Source of Pain) (Bilateral) (R>L); Lumbar spondylosis; Chronic shoulder impingement syndrome (Right); Low magnesium levels; CRP elevated; Elevated sedimentation rate; Chronic obstructive pulmonary disease (COPD) ([place]); Nicotine dependence; Chronic shoulder pain (Right); Abnormal nerve conduction studies; Chronic shoulder pain (Bilateral) (status post multiple surgeries) (R>L); Cervical facet hypertrophy (Bilateral); History of shoulder surgery 5 (Right); Lumbar foraminal stenosis (L3-4) (Left); 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 Her primarily concern today is the Neck Pain and Shoulder Pain (bilateral)  Pain Assessment: Self-Reported Pain Score: 6 /10 Clinically the patient looks like a 4/10 Reported level is inconsistent with clinical observations. Information on the proper use of the pain score provided to the patient today. Pain Type: Chronic pain Pain Location: Neck Pain Frequency: Constant  [name] was last seen on 11/01/2016 for medication management. During today's appointment we reviewed [name] chronic pain status, as well as her outpatient medication regimen. The patient had right hip surgery on November 2017. She fell and and broke her right hip. She did get some oxycodone from the surgeon. Oxycodone given by Dr. Roland Rack.  The patient  reports that she does not use drugs. Her body mass index is 19.74 kg/m.  Further details on both, my assessment(s), as well as the proposed treatment plan, please see below.  Controlled Substance Pharmacotherapy Assessment REMS (Risk Evaluation and Mitigation Strategy)  Analgesic:Morphine ER 30 mg every 12 hours (60 mg per day) MME/day:60 mg/day.  Landis Martins, RN  11/28/2016 (71).  5:54 PM  Signed Nursing Pain Medication Assessment:  Safety precautions to be maintained throughout the outpatient stay will include: orient to surroundings, keep bed in low position, maintain call bell within reach at all times, provide assistance with transfer out of bed and ambulation.  Medication Inspection Compliance: Pill count conducted under aseptic conditions, in front of the patient. Neither the pills nor the bottle was removed from the patient's sight at any time. Once count was completed pills were immediately returned to the patient in their original bottle.  Medication: Morphine ER (MSContin) Pill Count: 0 of  60 pills remain Bottle Appearance: Standard pharmacy container. Clearly labeled. Filled Date: 33 / 13/ 2017 Medication  last intake:does not remember   Also received Oxycodone 5 mg # 40 by Dr. Roland Rack on 11-10-16   Pharmacokinetics: Liberation and absorption (onset of action): WNL Distribution (time to peak effect): WNL Metabolism and excretion (duration of action): WNL         Pharmacodynamics: Desired effects: Analgesia: [name] reports >50% benefit. Functional ability: Patient reports that medication allows her to accomplish basic ADLs Clinically meaningful improvement in function (CMIF): Sustained CMIF goals met Perceived effectiveness: Described as relatively effective, allowing for increase in activities of daily living (ADL) Undesirable effects: Side-effects or Adverse reactions: None reported Monitoring: [place] PMP: Online review of the past 42-monthperiod conducted. Compliant with practice rules and regulations List of all UDS test(s) done:  Lab Results  Component Value Date   TOXASSSELUR FINAL 05/18/2016   TOXASSSELUR FINAL 02/28/2016   [place] 12/08/2015   Last UDS on record: ToxAssure Select 13  Date Value Ref Range Status  05/18/2016 FINAL  Final    Comment:    ==================================================================== TOXASSURE SELECT 13 (MW) ==================================================================== Test                             Result       Flag       Units Drug Present and Declared for Prescription Verification   Alprazolam                     318          EXPECTED   ng/mg creat   Alpha-hydroxyalprazolam        1286         EXPECTED   ng/mg creat    Source of alprazolam is a scheduled prescription medication.    Alpha-hydroxyalprazolam is an expected metabolite of alprazolam.   Morphine                       4538         EXPECTED   ng/mg creat    Potential sources of large amounts of morphine in the absence of    codeine  include administration of morphine or use of heroin. Drug Present not Declared for Prescription Verification   Hydromorphone                  244          UNEXPECTED ng/mg creat    Hydromorphone may be administered as a scheduled prescription    medication and is also an expected metabolite of hydrocodone.    Hydromorphone is also a minor metabolite of morphine which is    also present in this specimen. Concentrations of hydromorphone    rarely exceed 5% of the morphine concentration when metabolism of    morphine is the sole source of hydromorphone.     (244 is 5.37% of 4538.) ==================================================================== Test                      Result    Flag   Units      Ref Range   Creatinine              192              mg/dL      >=20 ==================================================================== Declared Medications:  The flagging and interpretation on this report are based on the  following declared medications.  Unexpected results may arise from  inaccuracies in the declared medications.  **Note: The testing scope of this panel includes these medications:  Alprazolam (Xanax)  Morphine (MS Contin)  **Note: The testing scope of this panel does not include following  reported medications:  Albuterol (Combivent)  Atorvastatin (Lipitor)  Bupropion (Wellbutrin)  Citalopram (Lexapro)  Fluticasone (Advair)  Ipratropium (Combivent)  Levothyroxine  Magnesium (Mag-Ox)  Salmeterol (Advair)  Trazodone (Desyrel)  Vitamin D3 ==================================================================== For clinical consultation, please call [phone]. ====================================================================    UDS interpretation: Non-Compliant Patient informed of the CDC guidelines and recommendations to stay away from the concomitant use of benzodiazepines and opioids due to the increased risk of respiratory depression and death. Medication  Assessment Form: Reviewed. Patient indicates being compliant with therapy Treatment compliance: Compliant Risk Assessment Profile: Aberrant behavior: See prior evaluations. None observed or detected today Comorbid factors increasing risk of overdose: See prior notes. No additional risks detected today Risk of substance use disorder (SUD): Low Opioid Risk Tool (ORT) Total Score:    Interpretation Table:  Score <3 = Low Risk for SUD  Score between 4-7 = Moderate Risk for SUD  Score >8 = High Risk for Opioid Abuse   Risk Mitigation Strategies:  Patient Counseling: Covered Patient-Prescriber Agreement (PPA): Present and active  Notification to other healthcare providers: Done  Pharmacologic Plan: No change in therapy, at this time  Laboratory Chemistry  Inflammation Markers Lab Results  Component Value Date   ESRSEDRATE 28 12/08/2015   CRP <0.5 12/08/2015   Renal Function Lab Results  Component Value Date   BUN 9 10/04/2016   CREATININE 0.79 10/04/2016   GFRAA >60 10/04/2016   GFRNONAA >60 10/04/2016   Hepatic Function Lab Results  Component Value Date   AST 30 10/01/2016   ALT 25 10/01/2016   ALBUMIN 3.4 (L) 10/01/2016   Electrolytes Lab Results  Component Value Date   NA 138 10/04/2016   K 4.8 10/04/2016   CL 101 10/04/2016   CALCIUM 8.2 (L) 10/04/2016   MG 1.9 10/04/2016   Pain Modulating Vitamins Lab Results  Component Value Date   VITAMINB12 179 (L) 11/28/2016   Coagulation Parameters Lab Results  Component Value Date   INR 1.08 10/01/2016   LABPROT 14.0 10/01/2016   PLT 298 10/03/2016   Cardiovascular Lab Results  Component Value Date   HGB 10.7 (L) 10/03/2016   HCT 31.6 (L) 10/03/2016   Note: Lab results reviewed.  Recent Diagnostic Imaging Review  Dg Chest 1 View  Result Date: 10/01/2016 CLINICAL DATA:  Preoperative respiratory exam for right-sided hip fracture. EXAM: CHEST 1 VIEW COMPARISON:  07/03/2013 FINDINGS: The heart size and  mediastinal contours are within normal limits. There is no evidence of pulmonary edema, consolidation, pneumothorax, nodule or pleural fluid. The visualized skeletal structures are unremarkable. IMPRESSION: No active disease. Electronically Signed   By: Aletta Edouard M.D.   On: 10/01/2016 09:18   Ct Head Wo Contrast  Result Date: 10/01/2016 CLINICAL DATA:  Patient fell several days ago and has been "scooting" around since that time. The patient is unable to get up and walk. EXAM: CT HEAD WITHOUT CONTRAST CT CERVICAL SPINE WITHOUT CONTRAST TECHNIQUE: Multidetector CT imaging of the head and cervical spine was performed following the standard protocol without intravenous contrast. Multiplanar CT image reconstructions of the cervical spine were also generated. COMPARISON:  MRI of the cervical spine February 10, 2014 FINDINGS: CT HEAD FINDINGS Brain: No subdural, epidural, or subarachnoid hemorrhage. Cerebellum, brainstem, and basal cisterns are within normal limits. No mass,  mass effect, or midline shift. The ventricles and sulci are prominent, likely age related. Moderate white matter changes are seen. No acute cortical ischemia or infarct. Vascular: Calcified atherosclerosis in the intracranial carotid arteries. Skull: Normal. Negative for fracture or focal lesion. Sinuses/Orbits: No acute finding. Other: No other abnormalities. CT CERVICAL SPINE FINDINGS Alignment: There is straightening of normal lordosis. Anterior plate and screws are seen from C3 through C7. Hardware is intact. No traumatic malalignment. Skull base and vertebrae: Evaluation of C3 through C7 is limited due to streak artifact off of surgical hardware. The hardware is intact with no evidence of failure. No fractures are seen through these levels. Mild anterior wedging of T1 is stable since comparison MRI. Mild anterior wedging of T2 is also stable. No fractures are seen through the levels of C1 or C2 either. The alignment at C2-3 is due to mild  flexion during the study. No prevertebral soft tissue swelling identified. The pre odontoid space is normal. Soft tissues and spinal canal: No prevertebral fluid or swelling. No visible canal hematoma. Disc levels:  Facet degenerative changes. Upper chest: Emphysematous changes in the lung apices. Other: No other abnormalities. IMPRESSION: 1. No acute intracranial abnormality. 2. Postsurgical changes in the cervical spine with no fracture or traumatic malalignment. Electronically Signed   By: Dorise Bullion III M.D   On: 10/01/2016 09:21   Ct Cervical Spine Wo Contrast  Result Date: 10/01/2016 CLINICAL DATA:  Patient fell several days ago and has been "scooting" around since that time. The patient is unable to get up and walk. EXAM: CT HEAD WITHOUT CONTRAST CT CERVICAL SPINE WITHOUT CONTRAST TECHNIQUE: Multidetector CT imaging of the head and cervical spine was performed following the standard protocol without intravenous contrast. Multiplanar CT image reconstructions of the cervical spine were also generated. COMPARISON:  MRI of the cervical spine February 10, 2014 FINDINGS: CT HEAD FINDINGS Brain: No subdural, epidural, or subarachnoid hemorrhage. Cerebellum, brainstem, and basal cisterns are within normal limits. No mass, mass effect, or midline shift. The ventricles and sulci are prominent, likely age related. Moderate white matter changes are seen. No acute cortical ischemia or infarct. Vascular: Calcified atherosclerosis in the intracranial carotid arteries. Skull: Normal. Negative for fracture or focal lesion. Sinuses/Orbits: No acute finding. Other: No other abnormalities. CT CERVICAL SPINE FINDINGS Alignment: There is straightening of normal lordosis. Anterior plate and screws are seen from C3 through C7. Hardware is intact. No traumatic malalignment. Skull base and vertebrae: Evaluation of C3 through C7 is limited due to streak artifact off of surgical hardware. The hardware is intact with no evidence  of failure. No fractures are seen through these levels. Mild anterior wedging of T1 is stable since comparison MRI. Mild anterior wedging of T2 is also stable. No fractures are seen through the levels of C1 or C2 either. The alignment at C2-3 is due to mild flexion during the study. No prevertebral soft tissue swelling identified. The pre odontoid space is normal. Soft tissues and spinal canal: No prevertebral fluid or swelling. No visible canal hematoma. Disc levels:  Facet degenerative changes. Upper chest: Emphysematous changes in the lung apices. Other: No other abnormalities. IMPRESSION: 1. No acute intracranial abnormality. 2. Postsurgical changes in the cervical spine with no fracture or traumatic malalignment. Electronically Signed   By: Dorise Bullion III M.D   On: 10/01/2016 09:21   Dg Hip Operative Unilat W Or W/o Pelvis Right  Result Date: 10/01/2016 CLINICAL DATA:  Right hip fracture repair EXAM: OPERATIVE RIGHT HIP (  WITH PELVIS IF PERFORMED) 4 VIEWS TECHNIQUE: Fluoroscopic spot image(s) were submitted for interpretation post-operatively. COMPARISON:  None FLUOROSCOPY TIME:  40 seconds FINDINGS: Intraoperative fluoroscopic spot image demonstrates interval ORIF of a right intertrochanteric fracture. Right proximal intramedullary nail with a cannulated interlocking femoral neck screw transfixes the intertrochanteric fracture. No failure or complication. IMPRESSION: ORIF right intertrochanteric fracture. Electronically Signed   By: Kathreen Devoid   On: 10/01/2016 16:18   Dg Hip Unilat W Or Wo Pelvis 2-3 Views Right  Result Date: 10/01/2016 CLINICAL DATA:  Fall EXAM: DG HIP (WITH OR WITHOUT PELVIS) 2-3V RIGHT COMPARISON:  None FINDINGS: There is an acute and comminuted intertrochanteric fracture involving the proximal right femur. Medial angulation of the distal fracture fragments noted. No dislocation. IMPRESSION: 1. Acute and comminuted intertrochanteric fracture of the proximal right femur.  Electronically Signed   By: Kerby Moors M.D.   On: 10/01/2016 09:18   Note: Imaging results reviewed.          Meds  The patient has a current medication list which includes the following prescription(s): albuterol-ipratropium, alprazolam, atorvastatin, bupropion, vitamin d3, escitalopram, fluticasone-salmeterol, ipratropium-albuterol, levothyroxine, magnesium oxide, morphine, morphine, morphine, oxycodone, pantoprazole, senna-docusate, tizanidine, and trazodone.  Current Outpatient Prescriptions on File Prior to Visit  Medication Sig  . albuterol-ipratropium (COMBIVENT) 18-103 MCG/ACT inhaler Inhale 2 puffs into the lungs every 6 (six) hours as needed.  . ALPRAZolam (XANAX) 1 MG tablet Take 1 tablet (1 mg total) by mouth 4 (four) times daily as needed.  . Fluticasone-Salmeterol (ADVAIR) 100-50 MCG/DOSE AEPB Inhale 1 puff into the lungs every 12 (twelve) hours.  Marland Kitchen oxyCODONE (OXY IR/ROXICODONE) 5 MG immediate release tablet Take 1-2 tablets (5-10 mg total) by mouth every 3 (three) hours as needed for moderate pain.   No current facility-administered medications on file prior to visit.    ROS  Constitutional: Denies any fever or chills Gastrointestinal: No reported hemesis, hematochezia, vomiting, or acute GI distress Musculoskeletal: Denies any acute onset joint swelling, redness, loss of ROM, or weakness Neurological: No reported episodes of acute onset apraxia, aphasia, dysarthria, agnosia, amnesia, paralysis, loss of coordination, or loss of consciousness  Allergies  Ms. Rundle has No Known Allergies.  [place]  Drug: Ms. Alwin  reports that she does not use drugs. Alcohol:  reports that she does not drink alcohol. Tobacco:  reports that she has been smoking Cigarettes.  She has been smoking about 0.50 packs per day. She has never used smokeless tobacco. Medical:  has a past medical history of Anxiety; CAD (coronary artery disease); Carpal tunnel syndrome; Complication of anesthesia; COPD  (chronic obstructive pulmonary disease) ([place]); CRP elevated (09/14/2015); Depression; Difficult intubation; Dyslipidemia; Dysrhythmia; Elevated sedimentation rate (09/14/2015); Esophageal spasm; Gastrointestinal parasites; Hiatal hernia; History of peptic ulcer disease; Hyperthyroidism; Hypothyroidism; Low magnesium levels (09/14/2015); Pelvic fracture ([place]) (2008); Reflux; and Rotator cuff injury. Family: family history is not on file.  Past Surgical History:  Procedure Laterality Date  . CARPAL TUNNEL RELEASE    . CESAREAN SECTION    . CHOLECYSTECTOMY    . INTRAMEDULLARY (IM) NAIL INTERTROCHANTERIC Right 10/01/2016   Procedure: INTRAMEDULLARY (IM) NAIL INTERTROCHANTRIC;  Surgeon: Corky Mull, MD;  Location: [facility];  Service: Orthopedics;  Laterality: Right;  . NECK SURGERY    . ROTATOR CUFF REPAIR     x2  . SHOULDER ARTHROSCOPY  12/07/2011   Procedure: ARTHROSCOPY SHOULDER;  Surgeon: Ninetta Lights, MD;  Location: [place];  Service: Orthopedics;  Laterality: Right;  Debridement Partial  Cuff Tear, Release Coracoacromial Ligament  . SHOULDER SURGERY  12/07/2011   right   Constitutional Exam  General appearance: Well nourished, well developed, and well hydrated. In no apparent acute distress Vitals:   11/28/16 0918  BP: (!) 142/78  Pulse: 92  Resp: 16  Temp: 98.5 F (36.9 C)  TempSrc: Oral  SpO2: 98%  Weight: 115 lb (52.2 kg)  Height: _0  (1.626 m)   BMI Assessment: Estimated body mass index is 19.74 kg/m as calculated from the following:   Height as of this encounter: _1  (1.626 m).   Weight as of this encounter: 115 lb (52.2 kg).  BMI interpretation table: BMI level Category Range association with higher incidence of chronic pain  <18 kg/m2 Underweight   18.5-24.9 kg/m2 Ideal body weight   25-29.9 kg/m2 Overweight Increased incidence by 20%  30-34.9 kg/m2 Obese (Class I) Increased incidence by 68%  35-39.9 kg/m2 Severe obesity (Class II) Increased  incidence by 136%  >40 kg/m2 Extreme obesity (Class III) Increased incidence by 254%   BMI Readings from Last 4 Encounters:  11/28/16 19.74 kg/m  10/01/16 19.74 kg/m  08/16/16 19.74 kg/m  05/18/16 19.74 kg/m   Wt Readings from Last 4 Encounters:  11/28/16 115 lb (52.2 kg)  10/01/16 115 lb (52.2 kg)  08/16/16 115 lb (52.2 kg)  05/18/16 115 lb (52.2 kg)  Psych/Mental status: Alert, oriented x 3 (person, place, & time) Eyes: PERLA Respiratory: No evidence of acute respiratory distress  Cervical Spine Exam  Inspection: No masses, redness, or swelling Alignment: Symmetrical Functional ROM: Unrestricted ROM Stability: No instability detected Muscle strength & Tone: Functionally intact Sensory: Unimpaired Palpation: Non-contributory  Upper Extremity (UE) Exam    Side: Right upper extremity  Side: Left upper extremity  Inspection: No masses, redness, swelling, or asymmetry  Inspection: No masses, redness, swelling, or asymmetry  Functional ROM: Unrestricted ROM          Functional ROM: Unrestricted ROM          Muscle strength & Tone: Functionally intact  Muscle strength & Tone: Functionally intact  Sensory: Unimpaired  Sensory: Unimpaired  Palpation: Non-contributory  Palpation: Non-contributory   Thoracic Spine Exam  Inspection: No masses, redness, or swelling Alignment: Symmetrical Functional ROM: Unrestricted ROM Stability: No instability detected Sensory: Unimpaired Muscle strength & Tone: Functionally intact Palpation: Non-contributory  Lumbar Spine Exam  Inspection: No masses, redness, or swelling Alignment: Symmetrical Functional ROM: Unrestricted ROM Stability: No instability detected Muscle strength & Tone: Functionally intact Sensory: Unimpaired Palpation: Non-contributory Provocative Tests: Lumbar Hyperextension and rotation test: evaluation deferred today       Patrick's Maneuver: evaluation deferred today              Gait & Posture Assessment   Ambulation: Unassisted Gait: Relatively normal for age and body habitus Posture: WNL   Lower Extremity Exam    Side: Right lower extremity  Side: Left lower extremity  Inspection: No masses, redness, swelling, or asymmetry  Inspection: No masses, redness, swelling, or asymmetry  Functional ROM: Unrestricted ROM          Functional ROM: Unrestricted ROM          Muscle strength & Tone: Functionally intact  Muscle strength & Tone: Functionally intact  Sensory: Unimpaired  Sensory: Unimpaired  Palpation: Non-contributory  Palpation: Non-contributory   Assessment  Primary Diagnosis & Pertinent Problem List: The primary encounter diagnosis was Chronic pain syndrome. 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  Status Diagnosis  Stable Stable Stable 1. Chronic pain syndrome   2. Chronic low back pain (Location of Primary Source of Pain) (Bilateral) (R>L)   3. Lumbar facet syndrome (Location of Primary Source of Pain) (Bilateral) (R>L)   4. Chronic neck pain (Location of Secondary source of pain) (Bilateral) (R>L)   5. Cervical facet syndrome (Location of Secondary source of pain) (Bilateral) (R>L)   6. 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 Long term prescription opiate use   8. Opiate use   9. Substance use disorder Risk: High   10. Myofascial pain syndrome, cervical      Plan of Care  Pharmacotherapy (Medications Ordered): Meds ordered this  encounter  Medications  . tizanidine (ZANAFLEX) 2 MG capsule    Sig: Take 1 capsule (2 mg total) by mouth 3 (three) times daily as needed for muscle spasms.    Dispense:  90 capsule    Refill:  2    Do not place this medication, or any other prescription from our practice, on "Automatic Refill". Patient may have prescription filled one day early if pharmacy is closed on scheduled refill date.  . morphine (MS CONTIN) 30 MG 12 hr tablet    Sig: Take 1 tablet (30 mg total) by mouth every 12 (twelve) hours.    Dispense:  60 tablet    Refill:  0    Do not place this medication, or any other prescription from our practice, on "Automatic Refill". Patient may have prescription filled one day early if pharmacy is closed on scheduled refill date. Do not fill until: 12/29/16 To last until: 01/28/17  . morphine (MS CONTIN) 30 MG 12 hr tablet    Sig: Take 1 tablet (30 mg total) by mouth every 12 (twelve) hours.    Dispense:  60 tablet    Refill:  0    Do not place this medication, or any other prescription from our practice, on "Automatic Refill". Patient may have prescription filled one day early if pharmacy is closed on scheduled refill date. Do not fill until: 01/28/17 To last until: 02/27/17  . morphine (MS CONTIN) 30 MG 12 hr tablet    Sig: Take 1 tablet (30 mg total) by mouth every 12 (twelve) hours.    Dispense:  60 tablet    Refill:  0    Do not place this medication, or any other prescription from our practice, on "Automatic Refill". Patient may have prescription filled one day early if pharmacy is closed on scheduled refill date. Do not fill until: 11/29/16 To last until: 12/29/16   New Prescriptions   MORPHINE (MS CONTIN) 30 MG 12 HR TABLET    Take 1 tablet (30 mg total) by mouth every 12 (twelve) hours.   MORPHINE (MS CONTIN) 30 MG 12 HR TABLET    Take 1 tablet (30 mg total) by mouth every 12 (twelve) hours.   Medications administered today: Ms. Maudlin had no medications administered  during this visit. Lab-work, procedure(s), and/or referral(s): Orders Placed This Encounter  Procedures  . ToxASSURE Select  13 (MW), Urine  . Vitamin B12  . 25-Hydroxyvitamin D Lcms D2+D3  . ToxASSURE Select 13 (MW), Urine   Imaging and/or referral(s): None  Interventional therapies: Planned, scheduled, and/or pending:   We will hold on any further interventions until the patient fully recovers from her recent right hip surgery.    Considering:   Diagnostic bilateral cervical facet block under fluoroscopic guidance and IV sedation. Possible bilateral cervical facet radiofrequency ablation under fluoroscopic guidance and IV sedation. Diagnostic right-sided cervical epidural steroid injection under fluoroscopic guidance, with or without sedation. Diagnostic bilateral intra-articular shoulder joint injection under fluoroscopic guidance, with or without sedation. Diagnostic bilateral suprascapular nerve block under fluoroscopic guidance, with or without sedation. Possible bilateral suprascapular nerve radiofrequency ablation under fluoroscopic guidance and IV sedation. Diagnostic bilateral lumbar facet block under fluoroscopic guidance and IV sedation.  Possible bilateral lumbar facet radiofrequency ablation under fluoroscopic guidance and IV sedation.  Diagnostic left-sided L3-4 transforaminal epidural steroid injection fluoroscopic guidance, with a without sedation.  Diagnostic L3-4 versus L4-5 lumbar epidural steroid injection under fluoroscopic guidance, with a without sedation.    Palliative PRN treatment(s):   Diagnostic bilateral cervical facet block under fluoroscopic guidance and IV sedation. Diagnostic right-sided cervical epidural steroid injection under fluoroscopic guidance, with or without sedation. Diagnostic bilateral intra-articular shoulder joint injection under fluoroscopic guidance, with or without sedation. Diagnostic bilateral suprascapular nerve block under  fluoroscopic guidance, with or without sedation. Diagnostic bilateral lumbar facet block under fluoroscopic guidance and IV sedation.  Diagnostic left-sided L3-4 transforaminal epidural steroid injection fluoroscopic guidance, with a without sedation.  Diagnostic L3-4 versus L4-5 lumbar epidural steroid injection under fluoroscopic guidance, with a without sedation.    Provider-requested follow-up: Return in about 3 months (around 02/26/2017) for (MD) Med-Mgmt.  No future appointments. Primary Care Physician: Albina Billet, MD Location: [facility] Note by: Kathlen Brunswick. Dossie Arbour, M.D, DABA, DABAPM, DABPM, DABIPP, FIPP Date: 11/29/16; Time: 10:50 AM  Pain Score Disclaimer: We use the NRS-11 scale. This is a self-reported, subjective measurement of pain severity with only modest accuracy. It is used primarily to identify changes within a particular patient. It must be understood that outpatient pain scales are significantly less accurate that those used for research, where they can be applied under ideal controlled circumstances with minimal exposure to variables. In reality, the score is likely to be a combination of pain intensity and pain affect, where pain affect describes the degree of emotional arousal or changes in action readiness caused by the sensory experience of pain. Factors such as social and work situation, setting, emotional state, anxiety levels, expectation, and prior pain experience may influence pain perception and show large inter-individual differences that may also be affected by time variables.  Patient instructions provided during this appointment: Patient Instructions  You were given 3 prescriptions for Morphine today.  A prescription for Zanaflex was sent to your pharmacy. Please get your labs done as soon as possible.

## 2016-11-28 NOTE — Progress Notes (Signed)
Nursing Pain Medication Assessment:  Safety precautions to be maintained throughout the outpatient stay will include: orient to surroundings, keep bed in low position, maintain call bell within reach at all times, provide assistance with transfer out of bed and ambulation.  Medication Inspection Compliance: Pill count conducted under aseptic conditions, in front of the patient. Neither the pills nor the bottle was removed from the patient's sight at any time. Once count was completed pills were immediately returned to the patient in their original bottle.  Medication: Morphine ER (MSContin) Pill Count: 0 of 60 pills remain Bottle Appearance: Standard pharmacy container. Clearly labeled. Filled Date: 61 / 13/ 2017 Medication last intake:does not remember   Also received Oxycodone 5 mg # 40 by Dr. Roland Rack on 11-10-16

## 2016-11-28 NOTE — Patient Instructions (Addendum)
You were given 3 prescriptions for Morphine today.  A prescription for Zanaflex was sent to your pharmacy. Please get your labs done as soon as possible.

## 2016-11-29 ENCOUNTER — Telehealth: Payer: Self-pay

## 2016-11-29 MED ORDER — MORPHINE SULFATE ER 30 MG PO TBCR: 30.0000 mg | EXTENDED_RELEASE_TABLET | Freq: Two times a day (BID) | ORAL | 0 refills | Status: DC

## 2016-11-29 NOTE — Telephone Encounter (Signed)
Pts pharmacy is saying she needs for Dr. Lowella Dandy to write her another prescription because prescription only has 15 days instead of 30.

## 2016-11-29 NOTE — Telephone Encounter (Signed)
Attempted to call patient and notify her that script is ready for pick up. No answer.

## 2016-11-29 NOTE — Telephone Encounter (Signed)
Patient notified that we are aware of the discrepancy and will discuss with Dr Dossie Arbour when he is done with procedures and will call her back with a solution.  Patient states understanding.

## 2016-11-29 NOTE — Telephone Encounter (Signed)
Pharmacy called wanting to know what to do.  Notified pharmacy to not fill incorrect prescription with quantity of 30.  Informed them that I had a script to be picked up here with the correct quantity of 60.  Attempted to call patient again and got no answer.  Will continue trying to notify patient.

## 2016-11-29 NOTE — Addendum Note (Signed)
Addended by: Milinda Pointer A on: 11/29/2016 10:51 AM   Modules accepted: Orders

## 2016-11-30 DIAGNOSIS — G8929 Other chronic pain: Secondary | ICD-10-CM | POA: Diagnosis not present

## 2016-11-30 DIAGNOSIS — E039 Hypothyroidism, unspecified: Secondary | ICD-10-CM | POA: Diagnosis not present

## 2016-11-30 DIAGNOSIS — Z9181 History of falling: Secondary | ICD-10-CM | POA: Diagnosis not present

## 2016-11-30 DIAGNOSIS — R69 Illness, unspecified: Secondary | ICD-10-CM | POA: Diagnosis not present

## 2016-11-30 DIAGNOSIS — L89320 Pressure ulcer of left buttock, unstageable: Secondary | ICD-10-CM | POA: Diagnosis not present

## 2016-11-30 DIAGNOSIS — I2510 Atherosclerotic heart disease of native coronary artery without angina pectoris: Secondary | ICD-10-CM | POA: Diagnosis not present

## 2016-11-30 DIAGNOSIS — S72141D Displaced intertrochanteric fracture of right femur, subsequent encounter for closed fracture with routine healing: Secondary | ICD-10-CM | POA: Diagnosis not present

## 2016-11-30 DIAGNOSIS — J449 Chronic obstructive pulmonary disease, unspecified: Secondary | ICD-10-CM | POA: Diagnosis not present

## 2016-12-01 LAB — 25-HYDROXY VITAMIN D LCMS D2+D3
25-Hydroxy, Vitamin D-2: 3.2 ng/mL
25-Hydroxy, Vitamin D-3: 33 ng/mL
25-Hydroxy, Vitamin D: 36 ng/mL

## 2016-12-04 LAB — TOXASSURE SELECT 13 (MW), URINE

## 2016-12-07 ENCOUNTER — Ambulatory Visit: Payer: Medicare HMO | Admitting: Pain Medicine

## 2016-12-07 DIAGNOSIS — Z9181 History of falling: Secondary | ICD-10-CM | POA: Diagnosis not present

## 2016-12-07 DIAGNOSIS — L89320 Pressure ulcer of left buttock, unstageable: Secondary | ICD-10-CM | POA: Diagnosis not present

## 2016-12-07 DIAGNOSIS — R69 Illness, unspecified: Secondary | ICD-10-CM | POA: Diagnosis not present

## 2016-12-07 DIAGNOSIS — G8929 Other chronic pain: Secondary | ICD-10-CM | POA: Diagnosis not present

## 2016-12-07 DIAGNOSIS — J449 Chronic obstructive pulmonary disease, unspecified: Secondary | ICD-10-CM | POA: Diagnosis not present

## 2016-12-07 DIAGNOSIS — I2510 Atherosclerotic heart disease of native coronary artery without angina pectoris: Secondary | ICD-10-CM | POA: Diagnosis not present

## 2016-12-07 DIAGNOSIS — E039 Hypothyroidism, unspecified: Secondary | ICD-10-CM | POA: Diagnosis not present

## 2016-12-07 DIAGNOSIS — S72141D Displaced intertrochanteric fracture of right femur, subsequent encounter for closed fracture with routine healing: Secondary | ICD-10-CM | POA: Diagnosis not present

## 2016-12-09 DIAGNOSIS — J449 Chronic obstructive pulmonary disease, unspecified: Secondary | ICD-10-CM | POA: Diagnosis not present

## 2016-12-09 DIAGNOSIS — E039 Hypothyroidism, unspecified: Secondary | ICD-10-CM | POA: Diagnosis not present

## 2016-12-09 DIAGNOSIS — S72141D Displaced intertrochanteric fracture of right femur, subsequent encounter for closed fracture with routine healing: Secondary | ICD-10-CM | POA: Diagnosis not present

## 2016-12-09 DIAGNOSIS — R69 Illness, unspecified: Secondary | ICD-10-CM | POA: Diagnosis not present

## 2016-12-09 DIAGNOSIS — G8929 Other chronic pain: Secondary | ICD-10-CM | POA: Diagnosis not present

## 2016-12-09 DIAGNOSIS — L89320 Pressure ulcer of left buttock, unstageable: Secondary | ICD-10-CM | POA: Diagnosis not present

## 2016-12-09 DIAGNOSIS — I2510 Atherosclerotic heart disease of native coronary artery without angina pectoris: Secondary | ICD-10-CM | POA: Diagnosis not present

## 2016-12-09 DIAGNOSIS — Z9181 History of falling: Secondary | ICD-10-CM | POA: Diagnosis not present

## 2016-12-11 DIAGNOSIS — L89320 Pressure ulcer of left buttock, unstageable: Secondary | ICD-10-CM | POA: Diagnosis not present

## 2016-12-11 DIAGNOSIS — R69 Illness, unspecified: Secondary | ICD-10-CM | POA: Diagnosis not present

## 2016-12-11 DIAGNOSIS — E039 Hypothyroidism, unspecified: Secondary | ICD-10-CM | POA: Diagnosis not present

## 2016-12-11 DIAGNOSIS — S72141D Displaced intertrochanteric fracture of right femur, subsequent encounter for closed fracture with routine healing: Secondary | ICD-10-CM | POA: Diagnosis not present

## 2016-12-11 DIAGNOSIS — Z9181 History of falling: Secondary | ICD-10-CM | POA: Diagnosis not present

## 2016-12-11 DIAGNOSIS — I2510 Atherosclerotic heart disease of native coronary artery without angina pectoris: Secondary | ICD-10-CM | POA: Diagnosis not present

## 2016-12-11 DIAGNOSIS — G8929 Other chronic pain: Secondary | ICD-10-CM | POA: Diagnosis not present

## 2016-12-11 DIAGNOSIS — J449 Chronic obstructive pulmonary disease, unspecified: Secondary | ICD-10-CM | POA: Diagnosis not present

## 2016-12-13 DIAGNOSIS — R69 Illness, unspecified: Secondary | ICD-10-CM | POA: Diagnosis not present

## 2016-12-13 DIAGNOSIS — L89320 Pressure ulcer of left buttock, unstageable: Secondary | ICD-10-CM | POA: Diagnosis not present

## 2016-12-13 DIAGNOSIS — E039 Hypothyroidism, unspecified: Secondary | ICD-10-CM | POA: Diagnosis not present

## 2016-12-13 DIAGNOSIS — Z9181 History of falling: Secondary | ICD-10-CM | POA: Diagnosis not present

## 2016-12-13 DIAGNOSIS — S72141D Displaced intertrochanteric fracture of right femur, subsequent encounter for closed fracture with routine healing: Secondary | ICD-10-CM | POA: Diagnosis not present

## 2016-12-13 DIAGNOSIS — I2510 Atherosclerotic heart disease of native coronary artery without angina pectoris: Secondary | ICD-10-CM | POA: Diagnosis not present

## 2016-12-13 DIAGNOSIS — J449 Chronic obstructive pulmonary disease, unspecified: Secondary | ICD-10-CM | POA: Diagnosis not present

## 2016-12-13 DIAGNOSIS — G8929 Other chronic pain: Secondary | ICD-10-CM | POA: Diagnosis not present

## 2016-12-14 DIAGNOSIS — S72141D Displaced intertrochanteric fracture of right femur, subsequent encounter for closed fracture with routine healing: Secondary | ICD-10-CM | POA: Diagnosis not present

## 2016-12-14 DIAGNOSIS — G8929 Other chronic pain: Secondary | ICD-10-CM | POA: Diagnosis not present

## 2016-12-14 DIAGNOSIS — Z9181 History of falling: Secondary | ICD-10-CM | POA: Diagnosis not present

## 2016-12-14 DIAGNOSIS — R69 Illness, unspecified: Secondary | ICD-10-CM | POA: Diagnosis not present

## 2016-12-14 DIAGNOSIS — L89320 Pressure ulcer of left buttock, unstageable: Secondary | ICD-10-CM | POA: Diagnosis not present

## 2016-12-14 DIAGNOSIS — I2510 Atherosclerotic heart disease of native coronary artery without angina pectoris: Secondary | ICD-10-CM | POA: Diagnosis not present

## 2016-12-14 DIAGNOSIS — E039 Hypothyroidism, unspecified: Secondary | ICD-10-CM | POA: Diagnosis not present

## 2016-12-14 DIAGNOSIS — J449 Chronic obstructive pulmonary disease, unspecified: Secondary | ICD-10-CM | POA: Diagnosis not present

## 2016-12-17 DIAGNOSIS — S72141D Displaced intertrochanteric fracture of right femur, subsequent encounter for closed fracture with routine healing: Secondary | ICD-10-CM | POA: Diagnosis not present

## 2016-12-17 DIAGNOSIS — G8929 Other chronic pain: Secondary | ICD-10-CM | POA: Diagnosis not present

## 2016-12-17 DIAGNOSIS — J449 Chronic obstructive pulmonary disease, unspecified: Secondary | ICD-10-CM | POA: Diagnosis not present

## 2016-12-17 DIAGNOSIS — E039 Hypothyroidism, unspecified: Secondary | ICD-10-CM | POA: Diagnosis not present

## 2016-12-17 DIAGNOSIS — I2510 Atherosclerotic heart disease of native coronary artery without angina pectoris: Secondary | ICD-10-CM | POA: Diagnosis not present

## 2016-12-17 DIAGNOSIS — R2689 Other abnormalities of gait and mobility: Secondary | ICD-10-CM | POA: Diagnosis not present

## 2016-12-17 DIAGNOSIS — R69 Illness, unspecified: Secondary | ICD-10-CM | POA: Diagnosis not present

## 2016-12-25 DIAGNOSIS — S72141D Displaced intertrochanteric fracture of right femur, subsequent encounter for closed fracture with routine healing: Secondary | ICD-10-CM | POA: Diagnosis not present

## 2016-12-26 DIAGNOSIS — R2689 Other abnormalities of gait and mobility: Secondary | ICD-10-CM | POA: Diagnosis not present

## 2016-12-26 DIAGNOSIS — S72141D Displaced intertrochanteric fracture of right femur, subsequent encounter for closed fracture with routine healing: Secondary | ICD-10-CM | POA: Diagnosis not present

## 2016-12-26 DIAGNOSIS — G8929 Other chronic pain: Secondary | ICD-10-CM | POA: Diagnosis not present

## 2016-12-26 DIAGNOSIS — R69 Illness, unspecified: Secondary | ICD-10-CM | POA: Diagnosis not present

## 2017-02-05 ENCOUNTER — Encounter: Payer: Self-pay | Admitting: Pain Medicine

## 2017-02-05 ENCOUNTER — Other Ambulatory Visit: Payer: Self-pay | Admitting: Pain Medicine

## 2017-02-05 DIAGNOSIS — E538 Deficiency of other specified B group vitamins: Secondary | ICD-10-CM

## 2017-02-05 MED ORDER — CYANOCOBALAMIN 1500 MCG PO TBDP: 1.0000 | ORAL_TABLET | Freq: Every day | ORAL | 0 refills | Status: DC

## 2017-02-05 MED ORDER — CYANOCOBALAMIN 2000 MCG PO TABS: 2000.0000 ug | ORAL_TABLET | Freq: Every day | ORAL | 0 refills | Status: DC

## 2017-02-05 NOTE — Progress Notes (Signed)
Reason for ordering the test: To determine the cause of the neurogenic pain Finding(s): Low Vitamin B-12 levels Explanation of findings:  Normal Vitamin B-12 level: Between 180 and 914 pg/mL Deficiency: levels below 180 pg/mL Insufficiency: levels between 200 and 500 pg/mL. These may be symptomatic, in which case it is considered an "insufficiency". Symptoms: (deficiency or insufficiency) tingling and numbness of the digits (fingers & toes), generalized muscle weakness, staggering, irritability, confusion, forgetfulness, tenderness, fatigue, shortness of breath, palpitation, anemia, sporadic episodes of diarrhea, decreased immune system, cognitive impairment, and degeneration of the posterior sensory columns of the spinal cord. Deficiency can lead to anemia and congestive heart failure. Lack of vitamin B12 may lead to peripheral neuropathy. Patient Recommendation(s): The recommended over-the-counter Vitamin B12 dose intake for deficiency is 125 to 2,000 micrograms of cyanocobalamin taken by mouth, daily.

## 2017-02-15 DIAGNOSIS — J449 Chronic obstructive pulmonary disease, unspecified: Secondary | ICD-10-CM | POA: Diagnosis not present

## 2017-02-15 DIAGNOSIS — R69 Illness, unspecified: Secondary | ICD-10-CM | POA: Diagnosis not present

## 2017-02-15 DIAGNOSIS — G8929 Other chronic pain: Secondary | ICD-10-CM | POA: Diagnosis not present

## 2017-02-15 DIAGNOSIS — I2510 Atherosclerotic heart disease of native coronary artery without angina pectoris: Secondary | ICD-10-CM | POA: Diagnosis not present

## 2017-02-15 DIAGNOSIS — E039 Hypothyroidism, unspecified: Secondary | ICD-10-CM | POA: Diagnosis not present

## 2017-02-15 DIAGNOSIS — S72141D Displaced intertrochanteric fracture of right femur, subsequent encounter for closed fracture with routine healing: Secondary | ICD-10-CM | POA: Diagnosis not present

## 2017-02-15 DIAGNOSIS — R2689 Other abnormalities of gait and mobility: Secondary | ICD-10-CM | POA: Diagnosis not present

## 2017-02-20 ENCOUNTER — Ambulatory Visit
Admission: RE | Admit: 2017-02-20 | Discharge: 2017-02-20 | Disposition: A | Discharge status: Home / Self Care | Payer: Medicare HMO | Source: Ambulatory Visit | Attending: Pain Medicine | Admitting: Pain Medicine

## 2017-02-20 ENCOUNTER — Other Ambulatory Visit
Admission: RE | Admit: 2017-02-20 | Discharge: 2017-02-20 | Disposition: A | Discharge status: Home / Self Care | Payer: Medicare HMO | Source: Ambulatory Visit | Attending: Pain Medicine | Admitting: Pain Medicine

## 2017-02-20 ENCOUNTER — Ambulatory Visit: Payer: Medicare HMO | Attending: Pain Medicine | Admitting: Pain Medicine

## 2017-02-20 ENCOUNTER — Telehealth: Payer: Self-pay

## 2017-02-20 ENCOUNTER — Encounter: Payer: Self-pay | Admitting: Pain Medicine

## 2017-02-20 VITALS — BP 157/92 | HR 115 | Temp 98.4°F | Resp 18 | Ht 64.0 in | Wt 115.0 lb

## 2017-02-20 DIAGNOSIS — M47812 Spondylosis without myelopathy or radiculopathy, cervical region: Secondary | ICD-10-CM | POA: Insufficient documentation

## 2017-02-20 DIAGNOSIS — S72001S Fracture of unspecified part of neck of right femur, sequela: Secondary | ICD-10-CM

## 2017-02-20 DIAGNOSIS — Z79899 Other long term (current) drug therapy: Secondary | ICD-10-CM | POA: Diagnosis not present

## 2017-02-20 DIAGNOSIS — M545 Low back pain: Secondary | ICD-10-CM | POA: Diagnosis not present

## 2017-02-20 DIAGNOSIS — J449 Chronic obstructive pulmonary disease, unspecified: Secondary | ICD-10-CM | POA: Insufficient documentation

## 2017-02-20 DIAGNOSIS — R102 Pelvic and perineal pain: Secondary | ICD-10-CM | POA: Diagnosis present

## 2017-02-20 DIAGNOSIS — I7090 Unspecified atherosclerosis: Secondary | ICD-10-CM | POA: Insufficient documentation

## 2017-02-20 DIAGNOSIS — M7918 Myalgia, other site: Secondary | ICD-10-CM

## 2017-02-20 DIAGNOSIS — E039 Hypothyroidism, unspecified: Secondary | ICD-10-CM | POA: Diagnosis not present

## 2017-02-20 DIAGNOSIS — E538 Deficiency of other specified B group vitamins: Secondary | ICD-10-CM | POA: Diagnosis not present

## 2017-02-20 DIAGNOSIS — Z5181 Encounter for therapeutic drug level monitoring: Secondary | ICD-10-CM | POA: Diagnosis not present

## 2017-02-20 DIAGNOSIS — M1288 Other specific arthropathies, not elsewhere classified, other specified site: Secondary | ICD-10-CM

## 2017-02-20 DIAGNOSIS — G8929 Other chronic pain: Secondary | ICD-10-CM

## 2017-02-20 DIAGNOSIS — R69 Illness, unspecified: Secondary | ICD-10-CM | POA: Diagnosis not present

## 2017-02-20 DIAGNOSIS — R790 Abnormal level of blood mineral: Secondary | ICD-10-CM

## 2017-02-20 DIAGNOSIS — S72141S Displaced intertrochanteric fracture of right femur, sequela: Secondary | ICD-10-CM

## 2017-02-20 DIAGNOSIS — G894 Chronic pain syndrome: Secondary | ICD-10-CM | POA: Diagnosis not present

## 2017-02-20 DIAGNOSIS — M791 Myalgia: Secondary | ICD-10-CM | POA: Diagnosis not present

## 2017-02-20 DIAGNOSIS — M47816 Spondylosis without myelopathy or radiculopathy, lumbar region: Secondary | ICD-10-CM

## 2017-02-20 DIAGNOSIS — Z79891 Long term (current) use of opiate analgesic: Secondary | ICD-10-CM | POA: Diagnosis not present

## 2017-02-20 DIAGNOSIS — X58XXXS Exposure to other specified factors, sequela: Secondary | ICD-10-CM | POA: Insufficient documentation

## 2017-02-20 DIAGNOSIS — F1190 Opioid use, unspecified, uncomplicated: Secondary | ICD-10-CM

## 2017-02-20 DIAGNOSIS — M542 Cervicalgia: Secondary | ICD-10-CM

## 2017-02-20 DIAGNOSIS — I2510 Atherosclerotic heart disease of native coronary artery without angina pectoris: Secondary | ICD-10-CM | POA: Insufficient documentation

## 2017-02-20 DIAGNOSIS — F17210 Nicotine dependence, cigarettes, uncomplicated: Secondary | ICD-10-CM | POA: Diagnosis not present

## 2017-02-20 DIAGNOSIS — M25551 Pain in right hip: Secondary | ICD-10-CM

## 2017-02-20 DIAGNOSIS — F1990 Other psychoactive substance use, unspecified, uncomplicated: Secondary | ICD-10-CM

## 2017-02-20 DIAGNOSIS — M488X2 Other specified spondylopathies, cervical region: Secondary | ICD-10-CM | POA: Diagnosis not present

## 2017-02-20 DIAGNOSIS — F419 Anxiety disorder, unspecified: Secondary | ICD-10-CM | POA: Diagnosis not present

## 2017-02-20 DIAGNOSIS — Z7951 Long term (current) use of inhaled steroids: Secondary | ICD-10-CM | POA: Diagnosis not present

## 2017-02-20 DIAGNOSIS — F329 Major depressive disorder, single episode, unspecified: Secondary | ICD-10-CM | POA: Insufficient documentation

## 2017-02-20 DIAGNOSIS — M488X6 Other specified spondylopathies, lumbar region: Secondary | ICD-10-CM | POA: Diagnosis not present

## 2017-02-20 DIAGNOSIS — K219 Gastro-esophageal reflux disease without esophagitis: Secondary | ICD-10-CM | POA: Insufficient documentation

## 2017-02-20 DIAGNOSIS — M79651 Pain in right thigh: Secondary | ICD-10-CM | POA: Diagnosis not present

## 2017-02-20 LAB — CBC WITH DIFFERENTIAL/PLATELET
BASOS ABS: 0.1 10*3/uL (ref 0–0.1)
BASOS PCT: 1 %
Eosinophils Absolute: 0.1 10*3/uL (ref 0–0.7)
Eosinophils Relative: 1 %
HEMATOCRIT: 35 % (ref 35.0–47.0)
HEMOGLOBIN: 11.5 g/dL — AB (ref 12.0–16.0)
LYMPHS ABS: 1.2 10*3/uL (ref 1.0–3.6)
Lymphocytes Relative: 16 %
MCH: 29.8 pg (ref 26.0–34.0)
MCHC: 32.9 g/dL (ref 32.0–36.0)
MCV: 90.8 fL (ref 80.0–100.0)
MONOS PCT: 4 %
Monocytes Absolute: 0.3 10*3/uL (ref 0.2–0.9)
Neutro Abs: 6.1 10*3/uL (ref 1.4–6.5)
Neutrophils Relative %: 78 %
Platelets: 405 10*3/uL (ref 150–440)
RBC: 3.86 MIL/uL (ref 3.80–5.20)
RDW: 17.1 % — ABNORMAL HIGH (ref 11.5–14.5)
WBC: 7.8 10*3/uL (ref 3.6–11.0)

## 2017-02-20 LAB — VITAMIN B12: Vitamin B-12: 232 pg/mL (ref 180–914)

## 2017-02-20 LAB — SEDIMENTATION RATE: Sed Rate: 45 mm/hr — ABNORMAL HIGH (ref 0–30)

## 2017-02-20 LAB — C-REACTIVE PROTEIN: CRP: 2.6 mg/dL — AB (ref <1.0)

## 2017-02-20 LAB — MAGNESIUM: MAGNESIUM: 1.7 mg/dL (ref 1.7–2.4)

## 2017-02-20 MED ORDER — MORPHINE SULFATE ER 30 MG PO TBCR: 30.0000 mg | EXTENDED_RELEASE_TABLET | Freq: Two times a day (BID) | ORAL | 0 refills | Status: DC

## 2017-02-20 MED ORDER — ORPHENADRINE CITRATE 30 MG/ML IJ SOLN
60.0000 mg | Freq: Once | INTRAMUSCULAR | Status: AC
  Administered 2017-02-20: 60 mg via INTRAMUSCULAR
  Filled 2017-02-20: qty 2

## 2017-02-20 MED ORDER — CYANOCOBALAMIN 1500 MCG PO TBDP: 1.0000 | ORAL_TABLET | Freq: Every day | ORAL | 0 refills | Status: DC

## 2017-02-20 MED ORDER — MAGNESIUM OXIDE 400 MG PO TABS: 400.0000 mg | ORAL_TABLET | Freq: Two times a day (BID) | ORAL | 0 refills | Status: DC

## 2017-02-20 MED ORDER — KETOROLAC TROMETHAMINE 60 MG/2ML IM SOLN
60.0000 mg | Freq: Once | INTRAMUSCULAR | Status: AC
  Administered 2017-02-20: 60 mg via INTRAMUSCULAR
  Filled 2017-02-20: qty 2

## 2017-02-20 MED ORDER — CYANOCOBALAMIN 2000 MCG PO TABS: 2000.0000 ug | ORAL_TABLET | Freq: Every day | ORAL | 0 refills | Status: DC

## 2017-02-20 MED ORDER — TIZANIDINE HCL 2 MG PO CAPS: 2.0000 mg | ORAL_CAPSULE | Freq: Three times a day (TID) | ORAL | 2 refills | Status: DC | PRN

## 2017-02-20 NOTE — Progress Notes (Signed)
Nursing Pain Medication Assessment:  Safety precautions to be maintained throughout the outpatient stay will include: orient to surroundings, keep bed in low position, maintain call bell within reach at all times, provide assistance with transfer out of bed and ambulation.  Medication Inspection Compliance: [name] did not comply with our request to bring her pills to be counted. She was reminded that bringing the medication bottles, even when empty, is a requirement. Pill/Patch Count: None available to be counted. Bottle Appearance: No container available. Did not bring bottle(s) to appointment. Medication: None brought in. Filled Date: N/A Last Medication intake:  Yesterday

## 2017-02-20 NOTE — Patient Instructions (Addendum)
Pain Score  Introduction: The pain score used by this practice is the Verbal Numerical Rating Scale (VNRS-11). This is an 11-point scale. It is for adults and children 10 years or older. There are significant differences in how the pain score is reported, used, and applied. Forget everything you learned in the past and learn this scoring system.  General Information: The scale should reflect your current level of pain. Unless you are specifically asked for the level of your worst pain, or your average pain. If you are asked for one of these two, then it should be understood that it is over the past 24 hours.  Basic Activities of Daily Living (ADL): Personal hygiene, dressing, eating, transferring, and using restroom.  Instructions: Most patients tend to report their level of pain as a combination of two factors, their physical pain and their psychosocial pain. This last one is also known as "suffering" and it is reflection of how physical pain affects you socially and psychologically. From now on, report them separately. From this point on, when asked to report your pain level, report only your physical pain. Use the following table for reference.  Pain Clinic Pain Levels (0-5/10)  Pain Level Score Description  No Pain 0   Mild pain 1 Nagging, annoying, but does not interfere with basic activities of daily living (ADL). Patients are able to eat, bathe, get dressed, toileting (being able to get on and off the toilet and perform personal hygiene functions), transfer (move in and out of bed or a chair without assistance), and maintain continence (able to control bladder and bowel functions). Blood pressure and heart rate are unaffected. A normal heart rate for a healthy adult ranges from 60 to 100 bpm (beats per minute).   Mild to moderate pain 2 Noticeable and distracting. Impossible to hide from other people. More frequent flare-ups. Still possible to adapt and function close to normal. It can be very  annoying and may have occasional stronger flare-ups. With discipline, patients may get used to it and adapt.   Moderate pain 3 Interferes significantly with activities of daily living (ADL). It becomes difficult to feed, bathe, get dressed, get on and off the toilet or to perform personal hygiene functions. Difficult to get in and out of bed or a chair without assistance. Very distracting. With effort, it can be ignored when deeply involved in activities.   Moderately severe pain 4 Impossible to ignore for more than a few minutes. With effort, patients may still be able to manage work or participate in some social activities. Very difficult to concentrate. Signs of autonomic nervous system discharge are evident: dilated pupils (mydriasis); mild sweating (diaphoresis); sleep interference. Heart rate becomes elevated (>115 bpm). Diastolic blood pressure (lower number) rises above 100 mmHg. Patients find relief in laying down and not moving.   Severe pain 5 Intense and extremely unpleasant. Associated with frowning face and frequent crying. Pain overwhelms the senses.  Ability to do any activity or maintain social relationships becomes significantly limited. Conversation becomes difficult. Pacing back and forth is common, as getting into a comfortable position is nearly impossible. Pain wakes you up from deep sleep. Physical signs will be obvious: pupillary dilation; increased sweating; goosebumps; brisk reflexes; cold, clammy hands and feet; nausea, vomiting or dry heaves; loss of appetite; significant sleep disturbance with inability to fall asleep or to remain asleep. When persistent, significant weight loss is observed due to the complete loss of appetite and sleep deprivation.  Blood pressure and heart   rate becomes significantly elevated. Caution: If elevated blood pressure triggers a pounding headache associated with blurred vision, then the patient should immediately seek attention at an urgent or  emergency care unit, as these may be signs of an impending stroke.    Emergency Department Pain Levels (6-10/10)  Emergency Room Pain 6 Severely limiting. Requires emergency care and should not be seen or managed at an outpatient pain management facility. Communication becomes difficult and requires great effort. Assistance to reach the emergency department may be required. Facial flushing and profuse sweating along with potentially dangerous increases in heart rate and blood pressure will be evident.   Distressing pain 7 Self-care is very difficult. Assistance is required to transport, or use restroom. Assistance to reach the emergency department will be required. Tasks requiring coordination, such as bathing and getting dressed become very difficult.   Disabling pain 8 Self-care is no longer possible. At this level, pain is disabling. The individual is unable to do even the most "basic" activities such as walking, eating, bathing, dressing, transferring to a bed, or toileting. Fine motor skills are lost. It is difficult to think clearly.   Incapacitating pain 9 Pain becomes incapacitating. Thought processing is no longer possible. Difficult to remember your own name. Control of movement and coordination are lost.   The worst pain imaginable 10 At this level, most patients pass out from pain. When this level is reached, collapse of the autonomic nervous system occurs, leading to a sudden drop in blood pressure and heart rate. This in turn results in a temporary and dramatic drop in blood flow to the brain, leading to a loss of consciousness. Fainting is one of the body's self defense mechanisms. Passing out puts the brain in a calmed state and causes it to shut down for a while, in order to begin the healing process.    Summary: 1. Refer to this scale when providing [place] with your pain level. 2. Be accurate and careful when reporting your pain level. This will help with your care. 3. Over-reporting  your pain level will lead to loss of credibility. 4. Even a level of 1/10 means that there is pain and will be treated at our facility. 5. High, inaccurate reporting will be documented as "Symptom Exaggeration", leading to loss of credibility and suspicions of possible secondary gains such as obtaining more narcotics, or wanting to appear disabled, for fraudulent reasons. 6. Only pain levels of 5 or below will be seen at our facility. 7. Pain levels of 6 and above will be sent to the Emergency Department and the appointment cancelled. _____________________________________________________________________________________________  PLEASE mAKE SURE THAT MS Folino SEES HER PRIMARY CARE DOCTOR AND TO WATCH DEPRESSION.   TAKE YOUR MEDICATIONS AS PRESCRIBE AND REMEMBER TO TAKE YOUR VITAMINS

## 2017-02-20 NOTE — Progress Notes (Signed)
Patient's Name: [name]  MRN: [id number]  Referring Provider: Albina Billet, MD  DOB: 05/27/1946  PCP: Albina Billet, MD  DOS: 02/20/2017  Note by: Kathlen Brunswick. Dossie Arbour, MD  Service setting: Ambulatory outpatient  Specialty: Interventional Pain Management  Location: [facility] (AMB) Pain Management Facility    Patient type: Established   Primary Reason(s) for Visit: Encounter for prescription drug management (Level of risk: moderate) CC: Pelvic Pain and Hip Pain (right)  HPI  [name] is a 71 y.o. year old, female patient, who comes today for a medication management evaluation. 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); Cervical facet syndrome (Location of Secondary source of pain) (Bilateral) (R>L); Cervical myofascial pain syndrome; Chronic low back pain (Location of Primary Source of Pain) (Bilateral) (R>L); Lumbar spondylosis; Chronic shoulder impingement syndrome (Right); Low magnesium levels; CRP elevated; Elevated sedimentation rate; Chronic obstructive pulmonary disease (COPD) ([place]); Nicotine dependence; Chronic shoulder pain (Right); Abnormal nerve conduction studies; Chronic shoulder pain (Bilateral) (status post multiple surgeries) (R>L); Cervical facet hypertrophy (Bilateral); History of shoulder surgery 5 (Right); Lumbar foraminal stenosis (L3-4) (Left); 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 Her primarily concern today is the Pelvic Pain and Hip Pain (right)  Pain Assessment: Self-Reported Pain Score: 7 /10 Clinically the patient looks like a 3/10 Reported level is inconsistent with clinical observations. Information on the proper use of the pain scale provided to the patient today Pain Type: Chronic pain Pain Location: Hip Pain Orientation: Right Pain Descriptors / Indicators: Aching, Constant, Radiating, Throbbing Pain Frequency: Constant  [name] was last scheduled for an appointment on 11/28/2016 for medication management. During today's appointment we reviewed [name] chronic pain status, as well as her outpatient medication regimen. The patient had a closed intertrochanteric fracture treated by ORIF of right hip by Dr. Roland Rack, on November 2017. Dr. Roland Rack provided the patient with additional pain medication postoperatively. We were aware of this and we agreed with the plan. She returns to the clinics today, 4 months later, claiming to continue having as much pain as before the surgery. [name] is known for being a high risk for substance use disorder. Once again, today she failed to bring her medications and pills to be counted as is our protocol. She indicates only having 2 pills left.  According to the patient's  [place], she had her last prescription filled on 01/27/2017. The prescription was written to be filled on 01/28/2017, but we to allow for the patient is to get it filled one there is early if the pharmacy is to be closed on the fill date or if there is bad weather. However, the patient is known that the prescription still needs to last until dissected date. This means that the  prescription should last until 02/27/2017. If she in fact has  2 pills left, this means that the patient to more medication than prescribed and this would constitute noncompliance with our medication treatment. In addition, she has not being compliant with our orders to bring her medications to her visits. Today she has been given a final warning that I will not continue to write for her medications if she is not allowing me to monitor them by counting pills. This will be the last time that we will be providing her with a prescription without her bringing her pills to be counted. Today she attempted to have those change her pain medication but in reality it is clear that she knows she will be running out of medication and she also knows that by getting another prescription she may be able to get a feel earlier. We will not be playing the game today. There is absolutely no reason why she should need a different medication from the one that she is taking since it is not causing any side effects or problems. In several occasions I have offered this patient interventional techniques in order to improve her pain so as to minimize her use of opioids. Every time she has turned these down. She has been made aware that we will not be increasing her opioid dose or switching her medications.  Today she is displaying a significant amount of pain behavior and she scored high on the depression scale. She indicates that Dr. Hall Busing has been managing her depression and therefore we have made arrangements for her to see him as soon as possible. Hopefully he will not be adding any benzodiazepines to her regimen. If anything, would feel a lot more comfortable if he were to slowly taper her off of the alprazolam so as to minimize the possibility of drug to drug interaction with her opioids. Today we have given the patient an IM injection of Toradol 60 mg and Norflex 60 mg to help with her pain. 15 minutes after the injection the patient  was doing extremely well and had calm down.  The patient  reports that she does not use drugs. Her body mass index is 19.74 kg/m.  Further details on both, my assessment(s), as well as the proposed treatment plan, please see below.  Controlled Substance Pharmacotherapy Assessment REMS (Risk Evaluation and Mitigation Strategy)  Analgesic:Morphine ER 30 mg every 12 hours (60 mg per day) MME/day:60 mg/day.  Ignatius Specking, RN  02/20/2017  2:00 PM  Sign at close encounter Nursing Pain Medication Assessment:  Safety precautions to be maintained throughout the outpatient stay will include: orient to surroundings, keep bed in low position, maintain call bell within reach at all times, provide assistance with transfer out of bed and ambulation.  Medication Inspection Compliance: Ms. Ganus did not comply with our request to bring her pills to be counted. She was reminded that bringing the medication bottles, even when empty, is a requirement. Pill/Patch Count: None available to be counted. Bottle Appearance: No container available. Did not bring bottle(s) to appointment. Medication: None brought in. Filled Date: N/A Last Medication intake:  Yesterday   Pharmacokinetics: Liberation and absorption (onset of action): WNL Distribution (time to peak effect): WNL Metabolism and excretion (duration of action): WNL         Pharmacodynamics: Desired effects: Analgesia: Ms. Allington reports >50% benefit. Functional ability: Patient reports that medication allows her to accomplish basic ADLs Clinically meaningful improvement in function (CMIF): Sustained CMIF goals met Perceived effectiveness: Described as relatively effective, allowing for increase in activities of  daily living (ADL) Undesirable effects: Side-effects or Adverse reactions: None reported Monitoring: [place] PMP: Online review of the past 16-monthperiod conducted. Compliant with practice rules and regulations. She did have some additional pain  medication from Dr. PRoland Rackfor the treatment of her acute postoperative pain. [facility] and therefore does not constitute a violation to the patient's medication agreement. She did have a total of 3 prescriptions. List of all UDS test(s) done:  Lab Results  Component Value Date   TOXASSSELUR FINAL 11/28/2016   TRussellFINAL 05/18/2016   TOXASSSELUR FINAL 02/28/2016   TOXASSSELUR FINAL 12/08/2015   Last UDS on record: [employer]ox[employer] Select 13  Date Value Ref Range Status  11/28/2016 FINAL  Final    Comment:    ==================================================================== TOXASSURE SELECT 13 (MW) ==================================================================== Test                             Result       Flag       Units Drug Present and Declared for Prescription Verification   Alprazolam                     173          EXPECTED   ng/mg creat   Alpha-hydroxyalprazolam        >2058        EXPECTED   ng/mg creat    Source of alprazolam is a scheduled prescription medication.    Alpha-hydroxyalprazolam is an expected metabolite of alprazolam.   Morphine                       1221         EXPECTED   ng/mg creat    Potential sources of large amounts of morphine in the absence of    codeine include administration of morphine or use of heroin.   Oxymorphone                    126          EXPECTED   ng/mg creat   Noroxycodone                   279          EXPECTED   ng/mg creat   Noroxymorphone                 52           EXPECTED   ng/mg creat    Oxymorphone, noroxycodone and noroxymorphone are expected    metabolites of oxycodone. Noroxymorphone is an expected    metabolite of oxymorphone. Sources of oxycodone and/or    oxymorphone include scheduled prescription medications. Drug Present not Declared for Prescription Verification   Hydromorphone                  112 (9%)      UNEXPECTED ng/mg creat      Hydromorphone may be administered as a scheduled prescription    medication and is also an expected metabolite of hydrocodone.    Hydromorphone is also a minor metabolite of morphine which is    also present in this specimen. Concentrations of hydromorphone    rarely exceed 5% of the morphine concentration when metabolism of    morphine is the sole source of hydromorphone. Drug Absent but Declared for  Prescription Verification   Oxycodone                      Not Detected UNEXPECTED ng/mg creat    Oxycodone is almost always present in patients taking this drug    consistently.  Absence of oxycodone could be due to lapse of time    since the last dose or unusual pharmacokinetics (rapid    metabolism). ==================================================================== Test                      Result    Flag   Units      Ref Range   Creatinine              243              mg/dL      >=20 ==================================================================== Declared Medications:  The flagging and interpretation on this report are based on the  following declared medications.  Unexpected results may arise from  inaccuracies in the declared medications.  **Note: The testing scope of this panel includes these medications:  Alprazolam  Morphine (Morphine Sulfate)  Oxycodone  **Note: The testing scope of this panel does not include following  reported medications:  Albuterol (Ipratropium-Albuterol)  Atorvastatin  Bupropion  Cholecalciferol  Docusate (Senokot-S)  Escitalopram  Fluticasone  Ipratropium (Ipratropium-Albuterol)  Levothyroxine  Magnesium Oxide  Pantoprazole  Salmeterol  Sennosides (Senokot-S)  Tizanidine  Trazodone ==================================================================== For clinical consultation, please call [phone]. ====================================================================    UDS interpretation: Compliant Patient informed of the CDC  guidelines and recommendations to stay away from the concomitant use of benzodiazepines and opioids due to the increased risk of respiratory depression and death. Medication Assessment Form: Reviewed. Patient indicates being compliant with therapy Treatment compliance: Compliant Risk Assessment Profile: Aberrant behavior: See prior evaluations. None observed or detected today Comorbid factors increasing risk of overdose: See prior notes. No additional risks detected today Risk of substance use disorder (SUD): High Opioid Risk Tool (ORT) Total Score: 1  Interpretation Table:  Score <3 = Low Risk for SUD  Score between 4-7 = Moderate Risk for SUD  Score >8 = High Risk for Opioid Abuse   Risk Mitigation Strategies:  Patient Counseling: Covered Patient-Prescriber Agreement (PPA): Present and active  Notification to other healthcare providers: Done  Pharmacologic Plan: No change in therapy, at this time  Laboratory Chemistry  Inflammation Markers Lab Results  Component Value Date   CRP <0.5 12/08/2015   ESRSEDRATE 45 (H) 02/20/2017   (CRP: Acute Phase) (ESR: Chronic Phase) Renal Function Markers Lab Results  Component Value Date   BUN 9 10/04/2016   CREATININE 0.79 10/04/2016   GFRAA >60 10/04/2016   GFRNONAA >60 10/04/2016   Hepatic Function Markers Lab Results  Component Value Date   AST 30 10/01/2016   ALT 25 10/01/2016   ALBUMIN 3.4 (L) 10/01/2016   ALKPHOS 67 10/01/2016   Electrolytes Lab Results  Component Value Date   NA 138 10/04/2016   K 4.8 10/04/2016   CL 101 10/04/2016   CALCIUM 8.2 (L) 10/04/2016   MG 1.7 02/20/2017   Neuropathy Markers Lab Results  Component Value Date   VITAMINB12 179 (L) 11/28/2016   Bone Pathology Markers Lab Results  Component Value Date   ALKPHOS 67 10/01/2016   25OHVITD1 36 11/28/2016   25OHVITD2 3.2 11/28/2016   25OHVITD3 33 11/28/2016   CALCIUM 8.2 (L) 10/04/2016   Coagulation Parameters Lab Results  Component  Value Date  INR 1.08 10/01/2016   LABPROT 14.0 10/01/2016   PLT 405 02/20/2017   Cardiovascular Markers Lab Results  Component Value Date   HGB 11.5 (L) 02/20/2017   HCT 35.0 02/20/2017   Note: Lab results reviewed.  Recent Diagnostic Imaging Review  No results found. Note: Imaging results reviewed.          Meds  The patient has a current medication list which includes the following prescription(s): albuterol-ipratropium, alprazolam, atorvastatin, bupropion, vitamin d3, cyanocobalamin, cyanocobalamin, escitalopram, fluticasone-salmeterol, ipratropium-albuterol, levothyroxine, magnesium oxide, morphine, morphine, morphine, pantoprazole, senna-docusate, and tizanidine.  Current Outpatient Prescriptions on File Prior to Visit  Medication Sig  . albuterol-ipratropium (COMBIVENT) 18-103 MCG/ACT inhaler Inhale 2 puffs into the lungs every 6 (six) hours as needed.  . ALPRAZolam (XANAX) 1 MG tablet Take 1 tablet (1 mg total) by mouth 4 (four) times daily as needed.  Marland Kitchen atorvastatin (LIPITOR) 40 MG tablet Take 40 mg by mouth daily.   Marland Kitchen buPROPion (WELLBUTRIN SR) 150 MG 12 hr tablet Take 150 mg by mouth 2 (two) times daily.   . Cholecalciferol (VITAMIN D3) 5000 units TABS Take 1 tablet by mouth daily.   Marland Kitchen escitalopram (LEXAPRO) 10 MG tablet Take 10 mg by mouth daily.   . Fluticasone-Salmeterol (ADVAIR) 100-50 MCG/DOSE AEPB Inhale 1 puff into the lungs every 12 (twelve) hours.  . Ipratropium-Albuterol (COMBIVENT) 20-100 MCG/ACT AERS respimat Inhale 2 puffs into the lungs every 6 (six) hours as needed for wheezing or shortness of breath.   . levothyroxine (SYNTHROID, LEVOTHROID) 88 MCG tablet Take 88 mcg by mouth daily before breakfast.   . pantoprazole (PROTONIX) 40 MG tablet Take 40 mg by mouth daily.   Marland Kitchen senna-docusate (SENOKOT-S) 8.6-50 MG tablet Take 1 tablet by mouth at bedtime.    No current facility-administered medications on file prior to visit.    ROS  Constitutional: Denies any  fever or chills Gastrointestinal: No reported hemesis, hematochezia, vomiting, or acute GI distress Musculoskeletal: Denies any acute onset joint swelling, redness, loss of ROM, or weakness Neurological: No reported episodes of acute onset apraxia, aphasia, dysarthria, agnosia, amnesia, paralysis, loss of coordination, or loss of consciousness  Allergies  Ms. Dasher has No Known Allergies.  [place]  Drug: Ms. Boyson  reports that she does not use drugs. Alcohol:  reports that she does not drink alcohol. Tobacco:  reports that she has been smoking Cigarettes.  She has been smoking about 0.50 packs per day. She has never used smokeless tobacco. Medical:  has a past medical history of Anxiety; CAD (coronary artery disease); Carpal tunnel syndrome; Complication of anesthesia; COPD (chronic obstructive pulmonary disease) ([place]); CRP elevated (09/14/2015); Depression; Difficult intubation; Dyslipidemia; Dysrhythmia; Elevated sedimentation rate (09/14/2015); Esophageal spasm; Gastrointestinal parasites; Hiatal hernia; History of peptic ulcer disease; Hyperthyroidism; Hypothyroidism; Low magnesium levels (09/14/2015); Pelvic fracture ([place]) (2008); Reflux; and Rotator cuff injury. Family: family history is not on file.  Past Surgical History:  Procedure Laterality Date  . CARPAL TUNNEL RELEASE    . CESAREAN SECTION    . CHOLECYSTECTOMY    . INTRAMEDULLARY (IM) NAIL INTERTROCHANTERIC Right 10/01/2016   Procedure: INTRAMEDULLARY (IM) NAIL INTERTROCHANTRIC;  Surgeon: Corky Mull, MD;  Location: [facility];  Service: Orthopedics;  Laterality: Right;  . NECK SURGERY    . ROTATOR CUFF REPAIR     x2  . SHOULDER ARTHROSCOPY  12/07/2011   Procedure: ARTHROSCOPY SHOULDER;  Surgeon: Ninetta Lights, MD;  Location: [place];  Service: Orthopedics;  Laterality: Right;  Debridement Partial  Cuff Tear, Release Coracoacromial Ligament  . SHOULDER SURGERY  12/07/2011   right   Constitutional Exam  General  appearance: Well nourished, well developed, and well hydrated. In no apparent acute distress Vitals:   02/20/17 1311  BP: (!) 157/92  Pulse: (!) 115  Resp: 18  Temp: 98.4 F (36.9 C)  TempSrc: Oral  Weight: 115 lb (52.2 kg)  Height: 5' 4"  (1.626 m)   BMI Assessment: Estimated body mass index is 19.74 kg/m as calculated from the following:   Height as of this encounter: 5' 4"  (1.626 m).   Weight as of this encounter: 115 lb (52.2 kg).  BMI interpretation table: BMI level Category Range association with higher incidence of chronic pain  <18 kg/m2 Underweight   18.5-24.9 kg/m2 Ideal body weight   25-29.9 kg/m2 Overweight Increased incidence by 20%  30-34.9 kg/m2 Obese (Class I) Increased incidence by 68%  35-39.9 kg/m2 Severe obesity (Class II) Increased incidence by 136%  >40 kg/m2 Extreme obesity (Class III) Increased incidence by 254%   BMI Readings from Last 4 Encounters:  02/20/17 19.74 kg/m  11/28/16 19.74 kg/m  10/01/16 19.74 kg/m  08/16/16 19.74 kg/m   Wt Readings from Last 4 Encounters:  02/20/17 115 lb (52.2 kg)  11/28/16 115 lb (52.2 kg)  10/01/16 115 lb (52.2 kg)  08/16/16 115 lb (52.2 kg)  Psych/Mental status: Alert, oriented x 3 (person, place, & time)       Eyes: PERLA Respiratory: No evidence of acute respiratory distress  Cervical Spine Exam  Inspection: No masses, redness, or swelling Alignment: Symmetrical Functional ROM: Unrestricted ROM Stability: No instability detected Muscle strength & Tone: Functionally intact Sensory: Unimpaired Palpation: No palpable anomalies  Upper Extremity (UE) Exam    Side: Right upper extremity  Side: Left upper extremity  Inspection: No masses, redness, swelling, or asymmetry. No contractures  Inspection: No masses, redness, swelling, or asymmetry. No contractures  Functional ROM: Unrestricted ROM          Functional ROM: Unrestricted ROM          Muscle strength & Tone: Functionally intact  Muscle strength &  Tone: Functionally intact  Sensory: Unimpaired  Sensory: Unimpaired  Palpation: No palpable anomalies  Palpation: No palpable anomalies  Specialized Test(s): Deferred         Specialized Test(s): Deferred          Thoracic Spine Exam  Inspection: No masses, redness, or swelling Alignment: Symmetrical Functional ROM: Unrestricted ROM Stability: No instability detected Sensory: Unimpaired Muscle strength & Tone: No palpable anomalies  Lumbar Spine Exam  Inspection: No masses, redness, or swelling Alignment: Symmetrical Functional ROM: Unrestricted ROM Stability: No instability detected Muscle strength & Tone: Functionally intact Sensory: Unimpaired Palpation: No palpable anomalies Provocative Tests: Lumbar Hyperextension and rotation test: evaluation deferred today       Patrick's Maneuver: evaluation deferred today              Gait & Posture Assessment  Ambulation: Patient ambulates using a cane Gait: Limited. Using assistive device to ambulate Posture: WNL   Lower Extremity Exam    Side: Right lower extremity  Side: Left lower extremity  Inspection: No masses, redness, swelling, or asymmetry. No contractures  Inspection: No masses, redness, swelling, or asymmetry. No contractures  Functional ROM: Guarding for hip joint  Functional ROM: Unrestricted ROM          Muscle strength & Tone: Functionally intact  Muscle strength & Tone: Functionally intact  Sensory: Unimpaired  Sensory: Unimpaired  Palpation: No palpable anomalies  Palpation: No palpable anomalies   Assessment  Primary Diagnosis & Pertinent Problem List: The primary encounter diagnosis was Right hip pain. Diagnoses of Closed fracture of right hip, sequela, Intertrochanteric fracture of right hip, sequela, Chronic pain syndrome, Chronic low back pain (Location of Primary Source of Pain) (Bilateral) (R>L), Lumbar facet syndrome (Location of Primary Source of Pain) (Bilateral) (R>L), Cervical facet syndrome (Location of  Secondary source of pain) (Bilateral) (R>L), Chronic neck pain (Location of Secondary source of pain) (Bilateral) (R>L), Myofascial pain syndrome, cervical, Low magnesium levels, B12 deficiency, Long term prescription opiate use, Substance use disorder Risk: High, and Opiate use were also pertinent to this visit.  Status Diagnosis  Improving Improving Improving 1. Right hip pain   2. Closed fracture of right hip, sequela   3. Intertrochanteric fracture of right hip, sequela   4. Chronic pain syndrome   5. Chronic low back pain (Location of Primary Source of Pain) (Bilateral) (R>L)   6. Lumbar facet syndrome (Location of Primary Source of Pain) (Bilateral) (R>L)   7. Cervical facet syndrome (Location of Secondary source of pain) (Bilateral) (R>L)   8. Chronic neck pain (Location of Secondary source of pain) (Bilateral) (R>L)   9. Myofascial pain syndrome, cervical   10. Low magnesium levels   11. B12 deficiency   12. Long term prescription opiate use   13. Substance use disorder Risk: High   14. Opiate use      Plan of Care  Pharmacotherapy (Medications Ordered): Meds ordered this encounter  Medications  . Cyanocobalamin (VITAMELTS ENERGY VITAMIN B-12) 1500 MCG TBDP    Sig: Take 1 tablet by mouth daily.    Dispense:  90 tablet    Refill:  0    Do not add to the "Automatic Refill" notification system.  . cyanocobalamin ([facility] VITAMIN B12) 2000 MCG tablet    Sig: Take 1 tablet (2,000 mcg total) by mouth daily.    Dispense:  90 tablet    Refill:  0    Do not add to the "Automatic Refill" notification system.  Marland Kitchen morphine (MS CONTIN) 30 MG 12 hr tablet    Sig: Take 1 tablet (30 mg total) by mouth every 12 (twelve) hours.    Dispense:  60 tablet    Refill:  0    Do not place this medication, or any other prescription from our practice, on "Automatic Refill". Patient may have prescription filled one day early if pharmacy is closed on scheduled refill date. Do not fill until:  04/28/17 To last until: 05/28/17  . morphine (MS CONTIN) 30 MG 12 hr tablet    Sig: Take 1 tablet (30 mg total) by mouth every 12 (twelve) hours.    Dispense:  60 tablet    Refill:  0    Do not place this medication, or any other prescription from our practice, on "Automatic Refill". Patient may have prescription filled one day early if pharmacy is closed on scheduled refill date. Do not fill until: 02/27/17 To last until: 03/29/17  . morphine (MS CONTIN) 30 MG 12 hr tablet    Sig: Take 1 tablet (30 mg total) by mouth every 12 (twelve) hours.    Dispense:  60 tablet    Refill:  0    Do not place this medication, or any other prescription from our practice, on "Automatic Refill". Patient may have prescription filled one day early if pharmacy is closed on scheduled refill date.  Do not fill until: 03/29/17 To last until: 04/28/17  . tizanidine (ZANAFLEX) 2 MG capsule    Sig: Take 1 capsule (2 mg total) by mouth 3 (three) times daily as needed for muscle spasms.    Dispense:  90 capsule    Refill:  2    Do not place this medication, or any other prescription from our practice, on "Automatic Refill". Patient may have prescription filled one day early if pharmacy is closed on scheduled refill date.  . orphenadrine (NORFLEX) injection 60 mg  . ketorolac (TORADOL) injection 60 mg  . magnesium oxide (MAG-OX) 400 MG tablet    Sig: Take 1 tablet (400 mg total) by mouth 2 (two) times daily.    Dispense:  180 tablet    Refill:  0    Do not place medication on "Automatic Refill". Fill one day early if pharmacy is closed on scheduled refill date.   New Prescriptions   No medications on file   Medications administered today: We administered orphenadrine and ketorolac. Lab-work, procedure(s), and/or referral(s): Orders Placed This Encounter  Procedures  . DG HIP UNILAT W OR W/O PELVIS 2-3 VIEWS RIGHT  . DG FEMUR, MIN 2 VIEWS RIGHT  . C-reactive protein  . Magnesium  . Sedimentation rate  .  Vitamin B12  . 25-Hydroxyvitamin D Lcms D2+D3  . CBC with Differential/Platelet   Imaging and/or referral(s): None  Interventional therapies: Planned, scheduled, and/or pending:   We will hold on any further interventions until the patient fully recovers from her recent right hip surgery.    Considering:   Diagnostic bilateral cervical facet block  Possible bilateral cervical facet radiofrequency ablation  Diagnostic right-sided cervical epidural steroid injection  Diagnostic bilateral intra-articular shoulder joint injection  Diagnostic bilateral suprascapular nerve block  Possible bilateral suprascapular nerve radiofrequency ablation  Diagnostic bilateral lumbar facet block  Possible bilateral lumbar facet radiofrequency ablation  Diagnostic left-sided L3-4 transforaminal epidural steroid injection  Diagnostic L3-4 versus L4-5 lumbar epidural steroid injection    Palliative PRN treatment(s):   Diagnostic bilateral cervical facet block  Diagnostic right-sided cervical epidural steroid injection  Diagnostic bilateral intra-articular shoulder joint injection  Diagnostic bilateral suprascapular nerve block  Diagnostic bilateral lumbar facet block  Diagnostic left-sided L3-4 transforaminal epidural steroid injection  Diagnostic L3-4 versus L4-5 lumbar epidural steroid injection    Provider-requested follow-up: Return in 3 months (on 05/10/2017) for (Nurse Practitioner) Med-Mgmt, in addition, (PRN) procedure.  Future Appointments Date Time Provider [place]  05/10/2017 1:20 PM [place], NP [facility] None   Primary Care Physician: Albina Billet, MD Location: [facility] Note by: Kathlen Brunswick. Dossie Arbour, M.D, DABA, DABAPM, DABPM, DABIPP, FIPP Date: 02/20/2017; Time: 5:47 PM  Pain Score Disclaimer: We use the NRS-11 scale. This is a self-reported, subjective measurement of pain severity with only modest accuracy. It is used primarily to  identify changes within a particular patient. It must be understood that outpatient pain scales are significantly less accurate that those used for research, where they can be applied under ideal controlled circumstances with minimal exposure to variables. In reality, the score is likely to be a combination of pain intensity and pain affect, where pain affect describes the degree of emotional arousal or changes in action readiness caused by the sensory experience of pain. Factors such as social and work situation, setting, emotional state, anxiety levels, expectation, and prior pain experience may influence pain perception and show large inter-individual differences that may also be affected by  time variables.  Patient instructions provided during this appointment: Patient Instructions   Pain Score  Introduction: The pain score used by this practice is the Verbal Numerical Rating Scale (VNRS-11). This is an 11-point scale. It is for adults and children 10 years or older. There are significant differences in how the pain score is reported, used, and applied. Forget everything you learned in the past and learn this scoring system.  General Information: The scale should reflect your current level of pain. Unless you are specifically asked for the level of your worst pain, or your average pain. If you are asked for one of these two, then it should be understood that it is over the past 24 hours.  Basic Activities of Daily Living (ADL): Personal hygiene, dressing, eating, transferring, and using restroom.  Instructions: Most patients tend to report their level of pain as a combination of two factors, their physical pain and their psychosocial pain. This last one is also known as "suffering" and it is reflection of how physical pain affects you socially and psychologically. From now on, report them separately. From this point on, when asked to report your pain level, report only your physical pain. Use the  following table for reference.  Pain Clinic Pain Levels (0-5/10)  Pain Level Score Description  No Pain 0   Mild pain 1 Nagging, annoying, but does not interfere with basic activities of daily living (ADL). Patients are able to eat, bathe, get dressed, toileting (being able to get on and off the toilet and perform personal hygiene functions), transfer (move in and out of bed or a chair without assistance), and maintain continence (able to control bladder and bowel functions). Blood pressure and heart rate are unaffected. A normal heart rate for a healthy adult ranges from 60 to 100 bpm (beats per minute).   Mild to moderate pain 2 Noticeable and distracting. Impossible to hide from other people. More frequent flare-ups. Still possible to adapt and function close to normal. It can be very annoying and may have occasional stronger flare-ups. With discipline, patients may get used to it and adapt.   Moderate pain 3 Interferes significantly with activities of daily living (ADL). It becomes difficult to feed, bathe, get dressed, get on and off the toilet or to perform personal hygiene functions. Difficult to get in and out of bed or a chair without assistance. Very distracting. With effort, it can be ignored when deeply involved in activities.   Moderately severe pain 4 Impossible to ignore for more than a few minutes. With effort, patients may still be able to manage work or participate in some social activities. Very difficult to concentrate. Signs of autonomic nervous system discharge are evident: dilated pupils (mydriasis); mild sweating (diaphoresis); sleep interference. Heart rate becomes elevated (>115 bpm). Diastolic blood pressure (lower number) rises above 100 mmHg. Patients find relief in laying down and not moving.   Severe pain 5 Intense and extremely unpleasant. Associated with frowning face and frequent crying. Pain overwhelms the senses.  Ability to do any activity or maintain social  relationships becomes significantly limited. Conversation becomes difficult. Pacing back and forth is common, as getting into a comfortable position is nearly impossible. Pain wakes you up from deep sleep. Physical signs will be obvious: pupillary dilation; increased sweating; goosebumps; brisk reflexes; cold, clammy hands and feet; nausea, vomiting or dry heaves; loss of appetite; significant sleep disturbance with inability to fall asleep or to remain asleep. When persistent, significant weight loss is observed due  to the complete loss of appetite and sleep deprivation.  Blood pressure and heart rate becomes significantly elevated. Caution: If elevated blood pressure triggers a pounding headache associated with blurred vision, then the patient should immediately seek attention at an urgent or emergency care unit, as these may be signs of an impending stroke.    Emergency Department Pain Levels (6-10/10)  Emergency Room Pain 6 Severely limiting. Requires emergency care and should not be seen or managed at an outpatient pain management facility. Communication becomes difficult and requires great effort. Assistance to reach the emergency department may be required. Facial flushing and profuse sweating along with potentially dangerous increases in heart rate and blood pressure will be evident.   Distressing pain 7 Self-care is very difficult. Assistance is required to transport, or use restroom. Assistance to reach the emergency department will be required. Tasks requiring coordination, such as bathing and getting dressed become very difficult.   Disabling pain 8 Self-care is no longer possible. At this level, pain is disabling. The individual is unable to do even the most "basic" activities such as walking, eating, bathing, dressing, transferring to a bed, or toileting. Fine motor skills are lost. It is difficult to think clearly.   Incapacitating pain 9 Pain becomes incapacitating. Thought processing is  no longer possible. Difficult to remember your own name. Control of movement and coordination are lost.   The worst pain imaginable 10 At this level, most patients pass out from pain. When this level is reached, collapse of the autonomic nervous system occurs, leading to a sudden drop in blood pressure and heart rate. This in turn results in a temporary and dramatic drop in blood flow to the brain, leading to a loss of consciousness. Fainting is one of the body's self defense mechanisms. Passing out puts the brain in a calmed state and causes it to shut down for a while, in order to begin the healing process.    Summary: 1. Refer to this scale when providing [place] with your pain level. 2. Be accurate and careful when reporting your pain level. This will help with your care. 3. Over-reporting your pain level will lead to loss of credibility. 4. Even a level of 1/10 means that there is pain and will be treated at our facility. 5. High, inaccurate reporting will be documented as "Symptom Exaggeration", leading to loss of credibility and suspicions of possible secondary gains such as obtaining more narcotics, or wanting to appear disabled, for fraudulent reasons. 6. Only pain levels of 5 or below will be seen at our facility. 7. Pain levels of 6 and above will be sent to the Emergency Department and the appointment cancelled. _____________________________________________________________________________________________  PLEASE mAKE SURE THAT MS Taitt SEES HER PRIMARY CARE DOCTOR AND TO WATCH DEPRESSION.   TAKE YOUR MEDICATIONS AS PRESCRIBE AND REMEMBER TO TAKE YOUR VITAMINS

## 2017-02-20 NOTE — Telephone Encounter (Signed)
Dr Lowella Dandy requested patient see Dr Hall Busing. I asked pt does she need help in getting the appointment. Pt told me that Dr Sondra Come wife had made the appointment for her.  Just wanted to make sure everyone is on the same page

## 2017-02-23 DIAGNOSIS — I2510 Atherosclerotic heart disease of native coronary artery without angina pectoris: Secondary | ICD-10-CM | POA: Diagnosis not present

## 2017-02-23 DIAGNOSIS — R69 Illness, unspecified: Secondary | ICD-10-CM | POA: Diagnosis not present

## 2017-02-23 DIAGNOSIS — E039 Hypothyroidism, unspecified: Secondary | ICD-10-CM | POA: Diagnosis not present

## 2017-02-23 DIAGNOSIS — S72141D Displaced intertrochanteric fracture of right femur, subsequent encounter for closed fracture with routine healing: Secondary | ICD-10-CM | POA: Diagnosis not present

## 2017-02-23 DIAGNOSIS — J449 Chronic obstructive pulmonary disease, unspecified: Secondary | ICD-10-CM | POA: Diagnosis not present

## 2017-02-23 DIAGNOSIS — R2689 Other abnormalities of gait and mobility: Secondary | ICD-10-CM | POA: Diagnosis not present

## 2017-02-23 DIAGNOSIS — G8929 Other chronic pain: Secondary | ICD-10-CM | POA: Diagnosis not present

## 2017-02-23 LAB — 25-HYDROXYVITAMIN D LCMS D2+D3
25-HYDROXY, VITAMIN D-2: 3.8 ng/mL
25-HYDROXY, VITAMIN D: 25 ng/mL — AB

## 2017-02-23 LAB — 25-HYDROXY VITAMIN D LCMS D2+D3: 25-Hydroxy, Vitamin D-3: 21 ng/mL

## 2017-02-26 ENCOUNTER — Ambulatory Visit: Payer: Medicare HMO | Admitting: Pain Medicine

## 2017-03-05 ENCOUNTER — Ambulatory Visit: Payer: Medicare HMO | Admitting: Pain Medicine

## 2017-03-06 DIAGNOSIS — I2510 Atherosclerotic heart disease of native coronary artery without angina pectoris: Secondary | ICD-10-CM | POA: Diagnosis not present

## 2017-03-06 DIAGNOSIS — R69 Illness, unspecified: Secondary | ICD-10-CM | POA: Diagnosis not present

## 2017-03-06 DIAGNOSIS — G8929 Other chronic pain: Secondary | ICD-10-CM | POA: Diagnosis not present

## 2017-03-06 DIAGNOSIS — J449 Chronic obstructive pulmonary disease, unspecified: Secondary | ICD-10-CM | POA: Diagnosis not present

## 2017-03-06 DIAGNOSIS — S72141D Displaced intertrochanteric fracture of right femur, subsequent encounter for closed fracture with routine healing: Secondary | ICD-10-CM | POA: Diagnosis not present

## 2017-03-06 DIAGNOSIS — R2689 Other abnormalities of gait and mobility: Secondary | ICD-10-CM | POA: Diagnosis not present

## 2017-03-06 DIAGNOSIS — E039 Hypothyroidism, unspecified: Secondary | ICD-10-CM | POA: Diagnosis not present

## 2017-03-09 DIAGNOSIS — S72141D Displaced intertrochanteric fracture of right femur, subsequent encounter for closed fracture with routine healing: Secondary | ICD-10-CM | POA: Diagnosis not present

## 2017-03-09 DIAGNOSIS — G8929 Other chronic pain: Secondary | ICD-10-CM | POA: Diagnosis not present

## 2017-03-09 DIAGNOSIS — R2689 Other abnormalities of gait and mobility: Secondary | ICD-10-CM | POA: Diagnosis not present

## 2017-03-09 DIAGNOSIS — I2510 Atherosclerotic heart disease of native coronary artery without angina pectoris: Secondary | ICD-10-CM | POA: Diagnosis not present

## 2017-03-09 DIAGNOSIS — R69 Illness, unspecified: Secondary | ICD-10-CM | POA: Diagnosis not present

## 2017-03-09 DIAGNOSIS — E039 Hypothyroidism, unspecified: Secondary | ICD-10-CM | POA: Diagnosis not present

## 2017-03-09 DIAGNOSIS — J449 Chronic obstructive pulmonary disease, unspecified: Secondary | ICD-10-CM | POA: Diagnosis not present

## 2017-03-12 ENCOUNTER — Encounter: Payer: Self-pay | Admitting: Nurse Practitioner

## 2017-03-12 DIAGNOSIS — E559 Vitamin D deficiency, unspecified: Secondary | ICD-10-CM | POA: Insufficient documentation

## 2017-03-13 DIAGNOSIS — R69 Illness, unspecified: Secondary | ICD-10-CM | POA: Diagnosis not present

## 2017-03-13 DIAGNOSIS — E785 Hyperlipidemia, unspecified: Secondary | ICD-10-CM | POA: Diagnosis not present

## 2017-03-13 DIAGNOSIS — E039 Hypothyroidism, unspecified: Secondary | ICD-10-CM | POA: Diagnosis not present

## 2017-03-13 DIAGNOSIS — E038 Other specified hypothyroidism: Secondary | ICD-10-CM | POA: Diagnosis not present

## 2017-03-13 DIAGNOSIS — K210 Gastro-esophageal reflux disease with esophagitis: Secondary | ICD-10-CM | POA: Diagnosis not present

## 2017-03-14 DIAGNOSIS — R69 Illness, unspecified: Secondary | ICD-10-CM | POA: Diagnosis not present

## 2017-03-14 DIAGNOSIS — S72141D Displaced intertrochanteric fracture of right femur, subsequent encounter for closed fracture with routine healing: Secondary | ICD-10-CM | POA: Diagnosis not present

## 2017-03-14 DIAGNOSIS — I2510 Atherosclerotic heart disease of native coronary artery without angina pectoris: Secondary | ICD-10-CM | POA: Diagnosis not present

## 2017-03-14 DIAGNOSIS — G8929 Other chronic pain: Secondary | ICD-10-CM | POA: Diagnosis not present

## 2017-03-14 DIAGNOSIS — E039 Hypothyroidism, unspecified: Secondary | ICD-10-CM | POA: Diagnosis not present

## 2017-03-14 DIAGNOSIS — J449 Chronic obstructive pulmonary disease, unspecified: Secondary | ICD-10-CM | POA: Diagnosis not present

## 2017-03-14 DIAGNOSIS — R2689 Other abnormalities of gait and mobility: Secondary | ICD-10-CM | POA: Diagnosis not present

## 2017-03-15 DIAGNOSIS — E039 Hypothyroidism, unspecified: Secondary | ICD-10-CM | POA: Diagnosis not present

## 2017-03-15 DIAGNOSIS — R2689 Other abnormalities of gait and mobility: Secondary | ICD-10-CM | POA: Diagnosis not present

## 2017-03-15 DIAGNOSIS — G8929 Other chronic pain: Secondary | ICD-10-CM | POA: Diagnosis not present

## 2017-03-15 DIAGNOSIS — S72141D Displaced intertrochanteric fracture of right femur, subsequent encounter for closed fracture with routine healing: Secondary | ICD-10-CM | POA: Diagnosis not present

## 2017-03-15 DIAGNOSIS — R69 Illness, unspecified: Secondary | ICD-10-CM | POA: Diagnosis not present

## 2017-03-15 DIAGNOSIS — J449 Chronic obstructive pulmonary disease, unspecified: Secondary | ICD-10-CM | POA: Diagnosis not present

## 2017-03-15 DIAGNOSIS — I2510 Atherosclerotic heart disease of native coronary artery without angina pectoris: Secondary | ICD-10-CM | POA: Diagnosis not present

## 2017-03-19 DIAGNOSIS — R69 Illness, unspecified: Secondary | ICD-10-CM | POA: Diagnosis not present

## 2017-03-19 DIAGNOSIS — R2689 Other abnormalities of gait and mobility: Secondary | ICD-10-CM | POA: Diagnosis not present

## 2017-03-19 DIAGNOSIS — J449 Chronic obstructive pulmonary disease, unspecified: Secondary | ICD-10-CM | POA: Diagnosis not present

## 2017-03-19 DIAGNOSIS — G8929 Other chronic pain: Secondary | ICD-10-CM | POA: Diagnosis not present

## 2017-03-19 DIAGNOSIS — E039 Hypothyroidism, unspecified: Secondary | ICD-10-CM | POA: Diagnosis not present

## 2017-03-19 DIAGNOSIS — I2510 Atherosclerotic heart disease of native coronary artery without angina pectoris: Secondary | ICD-10-CM | POA: Diagnosis not present

## 2017-03-19 DIAGNOSIS — S72141D Displaced intertrochanteric fracture of right femur, subsequent encounter for closed fracture with routine healing: Secondary | ICD-10-CM | POA: Diagnosis not present

## 2017-03-27 DIAGNOSIS — R2689 Other abnormalities of gait and mobility: Secondary | ICD-10-CM | POA: Diagnosis not present

## 2017-03-27 DIAGNOSIS — G8929 Other chronic pain: Secondary | ICD-10-CM | POA: Diagnosis not present

## 2017-03-27 DIAGNOSIS — I2510 Atherosclerotic heart disease of native coronary artery without angina pectoris: Secondary | ICD-10-CM | POA: Diagnosis not present

## 2017-03-27 DIAGNOSIS — E039 Hypothyroidism, unspecified: Secondary | ICD-10-CM | POA: Diagnosis not present

## 2017-03-27 DIAGNOSIS — R69 Illness, unspecified: Secondary | ICD-10-CM | POA: Diagnosis not present

## 2017-03-27 DIAGNOSIS — S72141D Displaced intertrochanteric fracture of right femur, subsequent encounter for closed fracture with routine healing: Secondary | ICD-10-CM | POA: Diagnosis not present

## 2017-03-27 DIAGNOSIS — J449 Chronic obstructive pulmonary disease, unspecified: Secondary | ICD-10-CM | POA: Diagnosis not present

## 2017-03-30 DIAGNOSIS — J449 Chronic obstructive pulmonary disease, unspecified: Secondary | ICD-10-CM | POA: Diagnosis not present

## 2017-03-30 DIAGNOSIS — R69 Illness, unspecified: Secondary | ICD-10-CM | POA: Diagnosis not present

## 2017-03-30 DIAGNOSIS — I2510 Atherosclerotic heart disease of native coronary artery without angina pectoris: Secondary | ICD-10-CM | POA: Diagnosis not present

## 2017-03-30 DIAGNOSIS — S72141D Displaced intertrochanteric fracture of right femur, subsequent encounter for closed fracture with routine healing: Secondary | ICD-10-CM | POA: Diagnosis not present

## 2017-03-30 DIAGNOSIS — E039 Hypothyroidism, unspecified: Secondary | ICD-10-CM | POA: Diagnosis not present

## 2017-03-30 DIAGNOSIS — G8929 Other chronic pain: Secondary | ICD-10-CM | POA: Diagnosis not present

## 2017-03-30 DIAGNOSIS — R2689 Other abnormalities of gait and mobility: Secondary | ICD-10-CM | POA: Diagnosis not present

## 2017-04-02 DIAGNOSIS — I2510 Atherosclerotic heart disease of native coronary artery without angina pectoris: Secondary | ICD-10-CM | POA: Diagnosis not present

## 2017-04-02 DIAGNOSIS — E039 Hypothyroidism, unspecified: Secondary | ICD-10-CM | POA: Diagnosis not present

## 2017-04-02 DIAGNOSIS — R2689 Other abnormalities of gait and mobility: Secondary | ICD-10-CM | POA: Diagnosis not present

## 2017-04-02 DIAGNOSIS — J449 Chronic obstructive pulmonary disease, unspecified: Secondary | ICD-10-CM | POA: Diagnosis not present

## 2017-04-02 DIAGNOSIS — R69 Illness, unspecified: Secondary | ICD-10-CM | POA: Diagnosis not present

## 2017-04-02 DIAGNOSIS — G8929 Other chronic pain: Secondary | ICD-10-CM | POA: Diagnosis not present

## 2017-04-02 DIAGNOSIS — S72141D Displaced intertrochanteric fracture of right femur, subsequent encounter for closed fracture with routine healing: Secondary | ICD-10-CM | POA: Diagnosis not present

## 2017-04-05 DIAGNOSIS — R69 Illness, unspecified: Secondary | ICD-10-CM | POA: Diagnosis not present

## 2017-04-05 DIAGNOSIS — I2510 Atherosclerotic heart disease of native coronary artery without angina pectoris: Secondary | ICD-10-CM | POA: Diagnosis not present

## 2017-04-05 DIAGNOSIS — J449 Chronic obstructive pulmonary disease, unspecified: Secondary | ICD-10-CM | POA: Diagnosis not present

## 2017-04-05 DIAGNOSIS — S72141D Displaced intertrochanteric fracture of right femur, subsequent encounter for closed fracture with routine healing: Secondary | ICD-10-CM | POA: Diagnosis not present

## 2017-04-05 DIAGNOSIS — G8929 Other chronic pain: Secondary | ICD-10-CM | POA: Diagnosis not present

## 2017-04-05 DIAGNOSIS — R2689 Other abnormalities of gait and mobility: Secondary | ICD-10-CM | POA: Diagnosis not present

## 2017-04-05 DIAGNOSIS — E039 Hypothyroidism, unspecified: Secondary | ICD-10-CM | POA: Diagnosis not present

## 2017-04-10 DIAGNOSIS — J449 Chronic obstructive pulmonary disease, unspecified: Secondary | ICD-10-CM | POA: Diagnosis not present

## 2017-04-10 DIAGNOSIS — I2510 Atherosclerotic heart disease of native coronary artery without angina pectoris: Secondary | ICD-10-CM | POA: Diagnosis not present

## 2017-04-10 DIAGNOSIS — G8929 Other chronic pain: Secondary | ICD-10-CM | POA: Diagnosis not present

## 2017-04-10 DIAGNOSIS — E039 Hypothyroidism, unspecified: Secondary | ICD-10-CM | POA: Diagnosis not present

## 2017-04-10 DIAGNOSIS — R2689 Other abnormalities of gait and mobility: Secondary | ICD-10-CM | POA: Diagnosis not present

## 2017-04-10 DIAGNOSIS — R69 Illness, unspecified: Secondary | ICD-10-CM | POA: Diagnosis not present

## 2017-04-10 DIAGNOSIS — S72141D Displaced intertrochanteric fracture of right femur, subsequent encounter for closed fracture with routine healing: Secondary | ICD-10-CM | POA: Diagnosis not present

## 2017-04-12 DIAGNOSIS — E039 Hypothyroidism, unspecified: Secondary | ICD-10-CM | POA: Diagnosis not present

## 2017-04-12 DIAGNOSIS — I2510 Atherosclerotic heart disease of native coronary artery without angina pectoris: Secondary | ICD-10-CM | POA: Diagnosis not present

## 2017-04-12 DIAGNOSIS — G8929 Other chronic pain: Secondary | ICD-10-CM | POA: Diagnosis not present

## 2017-04-12 DIAGNOSIS — R2689 Other abnormalities of gait and mobility: Secondary | ICD-10-CM | POA: Diagnosis not present

## 2017-04-12 DIAGNOSIS — R69 Illness, unspecified: Secondary | ICD-10-CM | POA: Diagnosis not present

## 2017-04-12 DIAGNOSIS — S72141D Displaced intertrochanteric fracture of right femur, subsequent encounter for closed fracture with routine healing: Secondary | ICD-10-CM | POA: Diagnosis not present

## 2017-04-12 DIAGNOSIS — J449 Chronic obstructive pulmonary disease, unspecified: Secondary | ICD-10-CM | POA: Diagnosis not present

## 2017-04-13 DIAGNOSIS — I2510 Atherosclerotic heart disease of native coronary artery without angina pectoris: Secondary | ICD-10-CM | POA: Diagnosis not present

## 2017-04-13 DIAGNOSIS — R2689 Other abnormalities of gait and mobility: Secondary | ICD-10-CM | POA: Diagnosis not present

## 2017-04-13 DIAGNOSIS — R69 Illness, unspecified: Secondary | ICD-10-CM | POA: Diagnosis not present

## 2017-04-13 DIAGNOSIS — S72141D Displaced intertrochanteric fracture of right femur, subsequent encounter for closed fracture with routine healing: Secondary | ICD-10-CM | POA: Diagnosis not present

## 2017-04-13 DIAGNOSIS — E039 Hypothyroidism, unspecified: Secondary | ICD-10-CM | POA: Diagnosis not present

## 2017-04-13 DIAGNOSIS — J449 Chronic obstructive pulmonary disease, unspecified: Secondary | ICD-10-CM | POA: Diagnosis not present

## 2017-04-13 DIAGNOSIS — G8929 Other chronic pain: Secondary | ICD-10-CM | POA: Diagnosis not present

## 2017-05-08 ENCOUNTER — Encounter: Payer: Self-pay | Admitting: Nurse Practitioner

## 2017-05-08 ENCOUNTER — Telehealth: Payer: Self-pay | Admitting: Pain Medicine

## 2017-05-08 ENCOUNTER — Ambulatory Visit: Payer: Medicare HMO | Attending: Nurse Practitioner | Admitting: Nurse Practitioner

## 2017-05-08 VITALS — BP 108/76 | HR 122 | Temp 98.2°F | Resp 16 | Ht 64.0 in | Wt 118.0 lb

## 2017-05-08 DIAGNOSIS — E782 Mixed hyperlipidemia: Secondary | ICD-10-CM | POA: Insufficient documentation

## 2017-05-08 DIAGNOSIS — F419 Anxiety disorder, unspecified: Secondary | ICD-10-CM | POA: Diagnosis not present

## 2017-05-08 DIAGNOSIS — M25511 Pain in right shoulder: Secondary | ICD-10-CM | POA: Diagnosis not present

## 2017-05-08 DIAGNOSIS — M25512 Pain in left shoulder: Secondary | ICD-10-CM | POA: Insufficient documentation

## 2017-05-08 DIAGNOSIS — G894 Chronic pain syndrome: Secondary | ICD-10-CM

## 2017-05-08 DIAGNOSIS — E538 Deficiency of other specified B group vitamins: Secondary | ICD-10-CM | POA: Diagnosis not present

## 2017-05-08 DIAGNOSIS — M545 Low back pain: Secondary | ICD-10-CM | POA: Diagnosis present

## 2017-05-08 DIAGNOSIS — M47812 Spondylosis without myelopathy or radiculopathy, cervical region: Secondary | ICD-10-CM | POA: Diagnosis not present

## 2017-05-08 DIAGNOSIS — J449 Chronic obstructive pulmonary disease, unspecified: Secondary | ICD-10-CM | POA: Insufficient documentation

## 2017-05-08 DIAGNOSIS — M160 Bilateral primary osteoarthritis of hip: Secondary | ICD-10-CM | POA: Insufficient documentation

## 2017-05-08 DIAGNOSIS — E559 Vitamin D deficiency, unspecified: Secondary | ICD-10-CM

## 2017-05-08 DIAGNOSIS — M4316 Spondylolisthesis, lumbar region: Secondary | ICD-10-CM | POA: Insufficient documentation

## 2017-05-08 DIAGNOSIS — G5603 Carpal tunnel syndrome, bilateral upper limbs: Secondary | ICD-10-CM | POA: Diagnosis not present

## 2017-05-08 DIAGNOSIS — K219 Gastro-esophageal reflux disease without esophagitis: Secondary | ICD-10-CM | POA: Insufficient documentation

## 2017-05-08 DIAGNOSIS — R69 Illness, unspecified: Secondary | ICD-10-CM | POA: Diagnosis not present

## 2017-05-08 DIAGNOSIS — F17210 Nicotine dependence, cigarettes, uncomplicated: Secondary | ICD-10-CM | POA: Insufficient documentation

## 2017-05-08 DIAGNOSIS — M47816 Spondylosis without myelopathy or radiculopathy, lumbar region: Secondary | ICD-10-CM

## 2017-05-08 DIAGNOSIS — I2510 Atherosclerotic heart disease of native coronary artery without angina pectoris: Secondary | ICD-10-CM | POA: Insufficient documentation

## 2017-05-08 DIAGNOSIS — M48061 Spinal stenosis, lumbar region without neurogenic claudication: Secondary | ICD-10-CM | POA: Insufficient documentation

## 2017-05-08 DIAGNOSIS — F329 Major depressive disorder, single episode, unspecified: Secondary | ICD-10-CM | POA: Insufficient documentation

## 2017-05-08 DIAGNOSIS — E0590 Thyrotoxicosis, unspecified without thyrotoxic crisis or storm: Secondary | ICD-10-CM | POA: Diagnosis not present

## 2017-05-08 DIAGNOSIS — E039 Hypothyroidism, unspecified: Secondary | ICD-10-CM | POA: Diagnosis not present

## 2017-05-08 DIAGNOSIS — K449 Diaphragmatic hernia without obstruction or gangrene: Secondary | ICD-10-CM | POA: Diagnosis not present

## 2017-05-08 DIAGNOSIS — F1120 Opioid dependence, uncomplicated: Secondary | ICD-10-CM | POA: Insufficient documentation

## 2017-05-08 DIAGNOSIS — M9983 Other biomechanical lesions of lumbar region: Secondary | ICD-10-CM | POA: Diagnosis not present

## 2017-05-08 MED ORDER — MORPHINE SULFATE ER 15 MG PO TBCR: 15.0000 mg | EXTENDED_RELEASE_TABLET | Freq: Two times a day (BID) | ORAL | 0 refills | Status: DC

## 2017-05-08 MED ORDER — CYANOCOBALAMIN 2000 MCG PO TABS: 2000.0000 ug | ORAL_TABLET | Freq: Every day | ORAL | 0 refills | Status: DC

## 2017-05-08 MED ORDER — VITAMIN D3 125 MCG (5000 UT) PO TABS: 1.0000 | ORAL_TABLET | Freq: Every day | ORAL | 2 refills | Status: DC

## 2017-05-08 NOTE — Progress Notes (Signed)
Nursing Pain Medication Assessment:  Safety precautions to be maintained throughout the outpatient stay will include: orient to surroundings, keep bed in low position, maintain call bell within reach at all times, provide assistance with transfer out of bed and ambulation.  Medication Inspection Compliance: Pill count conducted under aseptic conditions, in front of the patient. Neither the pills nor the bottle was removed from the patient's sight at any time. Once count was completed pills were immediately returned to the patient in their original bottle.  Medication: Morphine ER (MSContin) Pill/Patch Count: 42 of 60 pills remain Pill/Patch Appearance: Markings consistent with prescribed medication Bottle Appearance: Standard pharmacy container. Clearly labeled. Filled Date: 06/12 / 2018 Last Medication intake:  Today

## 2017-05-08 NOTE — Progress Notes (Signed)
Patient's Name: [name]  MRN: [id number]  Referring Provider: Albina Billet, MD  DOB: May 07, 1946  PCP: Albina Billet, MD  DOS: 05/08/2017  Note by: Vevelyn Francois NP  Service setting: Ambulatory outpatient  Specialty: Interventional Pain Management  Location: [facility] (AMB) Pain Management Facility    Patient type: Established    Primary Reason(s) for Visit: Encounter for prescription drug management (Level of risk: moderate) CC: Back Pain (right, lower)  HPI  [name] is a 71 y.o. year old, female patient, who comes today for a medication management evaluation. 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); Cervical facet syndrome (Location of Secondary source of pain) (Bilateral) (R>L); Cervical myofascial pain syndrome; Chronic low back pain (Location of Primary Source of Pain) (Bilateral) (R>L); Lumbar spondylosis; Chronic shoulder impingement syndrome (Right); Low magnesium levels; CRP elevated; Elevated sedimentation rate; Chronic obstructive pulmonary disease (COPD) ([place]); Nicotine dependence; Chronic shoulder pain (Right); Abnormal nerve conduction studies; Chronic shoulder pain (Bilateral) (status post multiple surgeries) (R>L); Cervical facet hypertrophy (Bilateral); History of shoulder surgery 5 (Right); Lumbar foraminal stenosis (L3-4) (Left); 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 Her primarily concern today is the Back Pain (right, lower)  Pain Assessment: Self-Reported Pain Score: 8 /10 Clinically the patient looks like a 3/10 Reported level is inconsistent with clinical observations. Information on the proper use of the pain scale provided to the patient today Pain Type: Chronic pain Pain Location: Back Pain Orientation: Lower, Right Pain Descriptors / Indicators: Burning (stinging) Pain Frequency: Intermittent  [name] was last scheduled for an appointment on Visit date not found for medication management. During today's appointment we reviewed [name] chronic pain status, as well as her outpatient medication regimen. She has chronic low back pain. She has radicular symptoms that do down into her knee. She has numbness tingling or weakness. Family friend was in with the patient on today admits that patient is not eating or drinking. She also states that she appears oversedated daily. She is concerned that the medication may be too strong. [name] states that she did not sleep well last night secondary to her dog. She admits that she is not always taking the morphine during the day.  The patient  reports that she does not use drugs. Her body mass index is 20.25 kg/m.  Further details on both, my assessment(s), as well as the proposed treatment plan, please see below.  Controlled Substance Pharmacotherapy Assessment REMS (Risk Evaluation and Mitigation Strategy)  Analgesic:Morphine ER 30 mg every 12 hours (60 mg per day) MME/day:60 mg/day.  Alison Martins, RN  05/08/2017  1:09 PM  Sign at close encounter Nursing Pain Medication Assessment:  Safety precautions to be maintained throughout the  outpatient stay will include: orient to surroundings, keep bed in low position, maintain call bell  within reach at all times, provide assistance with transfer out of bed and ambulation.  Medication Inspection Compliance: Pill count conducted under aseptic conditions, in front of the patient. Neither the pills nor the bottle was removed from the patient's sight at any time. Once count was completed pills were immediately returned to the patient in their original bottle.  Medication: Morphine ER (MSContin) Pill/Patch Count: 42 of 60 pills remain Pill/Patch Appearance: Markings consistent with prescribed medication Bottle Appearance: Standard pharmacy container. Clearly labeled. Filled Date: 06/12 / 2018 Last Medication intake:  Today   Pharmacokinetics: Liberation and absorption (onset of action): WNL Distribution (time to peak effect): WNL Metabolism and excretion (duration of action): WNL         Pharmacodynamics: Desired effects: Analgesia: [name] reports >50% benefit. Functional ability: Patient reports that medication allows her to accomplish basic ADLs Clinically meaningful improvement in function (CMIF): Sustained CMIF goals met Perceived effectiveness: Described as relatively effective, allowing for increase in activities of daily living (ADL) Undesirable effects: Side-effects or Adverse reactions: None reported Monitoring: [place] PMP: Online review of the past 67-monthperiod conducted. Compliant with practice rules and regulations List of all UDS test(s) done:  Lab Results  Component Value Date   TOXASSSELUR FINAL 11/28/2016   [place]AL 05/18/2016   T[place]ineFINAL 02/28/2016   TM[place]FINAL 12/08/2015   Last UDS on record: ToxAssure Select 13  Date Value Ref Range Status  11/28/2016 FINAL  Final    Comment:    ==================================================================== TOXASSURE SELECT 13  (MW) ==================================================================== Test                             Result       Flag       Units Drug Present and Declared for Prescription Verification   Alprazolam                     173          EXPECTED   ng/mg creat   Alpha-hydroxyalprazolam        >2058        EXPECTED   ng/mg creat    Source of alprazolam is a scheduled prescription medication.    Alpha-hydroxyalprazolam is an expected metabolite of alprazolam.   Morphine                       1221         EXPECTED   ng/mg creat    Potential sources of large amounts of morphine in the absence of    codeine include administration of morphine or use of heroin.   Oxymorphone                    126          EXPECTED   ng/mg creat   Noroxycodone                   279          EXPECTED   ng/mg creat   Noroxymorphone                 52           EXPECTED   ng/mg creat    Oxymorphone, noroxycodone and noroxymorphone are expected    metabolites of oxycodone. Noroxymorphone is an expected    metabolite of oxymorphone. Sources of oxycodone and/or  oxymorphone include scheduled prescription medications. Drug Present not Declared for Prescription Verification   Hydromorphone                  112          UNEXPECTED ng/mg creat    Hydromorphone may be administered as a scheduled prescription    medication and is also an expected metabolite of hydrocodone.    Hydromorphone is also a minor metabolite of morphine which is    also present in this specimen. Concentrations of hydromorphone    rarely exceed 5% of the morphine concentration when metabolism of    morphine is the sole source of hydromorphone. Drug Absent but Declared for Prescription Verification   Oxycodone                      Not Detected UNEXPECTED ng/mg creat    Oxycodone is almost always present in patients taking this drug    consistently.  Absence of oxycodone could be due to lapse of time    since the last dose or unusual  pharmacokinetics (rapid    metabolism). ==================================================================== Test                      Result    Flag   Units      Ref Range   Creatinine              243              mg/dL      >=20 ==================================================================== Declared Medications:  The flagging and interpretation on this report are based on the  following declared medications.  Unexpected results may arise from  inaccuracies in the declared medications.  **Note: The testing scope of this panel includes these medications:  Alprazolam  Morphine (Morphine Sulfate)  Oxycodone  **Note: The testing scope of this panel does not include following  reported medications:  Albuterol (Ipratropium-Albuterol)  Atorvastatin  Bupropion  Cholecalciferol  Docusate (Senokot-S)  Escitalopram  Fluticasone  Ipratropium (Ipratropium-Albuterol)  Levothyroxine  Magnesium Oxide  Pantoprazole  Salmeterol  Sennosides (Senokot-S)  Tizanidine  Trazodone ==================================================================== For clinical consultation, please call [phone]. ====================================================================    UDS interpretation: Compliant          Medication Assessment Form: Discrepancies found between patient's report and information collected Treatment compliance: Non-compliant. Steps taken to remind the patient of the seriousness of adequate therapy compliance Risk Assessment Profile: Aberrant behavior: appearance of being over sedated Comorbid factors increasing risk of overdose: See prior notes. No additional risks detected today Risk of substance use disorder (SUD): Low Opioid Risk Tool (ORT) Total Score: 0  Interpretation Table:  Score <3 = Low Risk for SUD  Score between 4-7 = Moderate Risk for SUD  Score >8 = High Risk for Opioid Abuse   Risk Mitigation Strategies:  Patient Counseling:  Covered Patient-Prescriber Agreement (PPA): Present and active  Notification to other healthcare providers: Done  Pharmacologic Plan: No change in therapy, at this time  Laboratory Chemistry  Inflammation Markers Lab Results  Component Value Date   CRP 2.6 (H) 02/20/2017   ESRSEDRATE 45 (H) 02/20/2017   (CRP: Acute Phase) (ESR: Chronic Phase) Renal Function Markers Lab Results  Component Value Date   BUN 9 10/04/2016   CREATININE 0.79 10/04/2016   GFRAA >60 10/04/2016   GFRNONAA >60 10/04/2016   Hepatic Function Markers Lab Results  Component Value Date   AST 30 10/01/2016  ALT 25 10/01/2016   ALBUMIN 3.4 (L) 10/01/2016   ALKPHOS 67 10/01/2016   Electrolytes Lab Results  Component Value Date   NA 138 10/04/2016   K 4.8 10/04/2016   CL 101 10/04/2016   CALCIUM 8.2 (L) 10/04/2016   MG 1.7 02/20/2017   Neuropathy Markers Lab Results  Component Value Date   VITAMINB12 232 02/20/2017   Bone Pathology Markers Lab Results  Component Value Date   ALKPHOS 67 10/01/2016   25OHVITD1 25 (L) 02/20/2017   25OHVITD2 3.8 02/20/2017   25OHVITD3 21 02/20/2017   CALCIUM 8.2 (L) 10/04/2016   Coagulation Parameters Lab Results  Component Value Date   INR 1.08 10/01/2016   LABPROT 14.0 10/01/2016   PLT 405 02/20/2017   Cardiovascular Markers Lab Results  Component Value Date   HGB 11.5 (L) 02/20/2017   HCT 35.0 02/20/2017   Note: Lab results reviewed.  Recent Diagnostic Imaging Review  Dg Hip Unilat W Or W/o Pelvis 2-3 Views Right  Result Date: 02/20/2017 CLINICAL DATA:  Initial evaluation for right hip and femur pain, status post IM nail placement on 10/01/2016. EXAM: DG HIP (WITH OR WITHOUT PELVIS) 2-3V RIGHT; RIGHT FEMUR 2 VIEWS COMPARISON:  Prior radiograph from 10/01/2016. FINDINGS: There has been interval placement of an intramedullary fixation nail into the right femur for previously identified comminuted intratrochanteric femur fracture. B nail along with the  proximal and distal interlocking screws appear well positioned. No periprosthetic lucency to suggest failure. There has been some interval healing about the intertrochanteric fracture. Persistent mild displacement at the greater and lesser trochanteric fracture fragments. Femoral head in normal line with the acetabulum. Bony pelvis intact. Limited views of the left hip demonstrate no acute abnormality. 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 Degenerative changes noted within the lower lumbar spine, stable. The Degenerative osteoarthritic changes noted at the right knee. No acute soft tissue abnormality. Vascular calcifications noted within the thigh. IMPRESSION: 1. Interval placement of IM fixation nail for previous intertrochanteric right femur fracture. No complication or evidence for hardware failure. 2. Interval healing about the comminuted intertrochanteric right femoral fracture. 3. No new acute osseous abnormality identified about the right hip or femur. 4. Atherosclerosis. Electronically Signed   By: Jeannine Boga M.D.   On: 02/20/2017 16:02   Dg Femur, Min 2 Views Right  Result Date: 02/20/2017 CLINICAL DATA:  Initial evaluation for right hip and femur pain, status post IM nail placement on 10/01/2016. EXAM: DG HIP (WITH OR WITHOUT PELVIS) 2-3V RIGHT; RIGHT FEMUR 2 VIEWS COMPARISON:  Prior radiograph from 10/01/2016. FINDINGS: There has been interval placement of an intramedullary fixation nail into the right femur for previously identified comminuted intratrochanteric femur fracture. B nail along with the proximal and distal interlocking screws appear well positioned. No periprosthetic lucency to suggest failure. There has been some interval healing about the intertrochanteric fracture. Persistent mild displacement at the greater and lesser trochanteric fracture fragments. Femoral head in normal line with the acetabulum. Bony pelvis intact. Limited views of the left  hip demonstrate no acute abnormality. 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 Degenerative changes noted within the lower lumbar spine, stable. The Degenerative osteoarthritic changes noted at the right knee. No acute soft tissue abnormality. Vascular calcifications noted within the thigh. IMPRESSION: 1. Interval placement of IM fixation nail for previous intertrochanteric right femur fracture. No complication or evidence for hardware failure. 2. Interval healing about the comminuted intertrochanteric right femoral fracture. 3. No new acute osseous abnormality identified  about the right hip or femur. 4. Atherosclerosis. Electronically Signed   By: Jeannine Boga M.D.   On: 02/20/2017 16:02   Note: Imaging results reviewed.          Meds  The patient has a current medication list which includes the following prescription(s): albuterol-ipratropium, alprazolam, atorvastatin, bupropion, vitamin d3, cyanocobalamin, cyanocobalamin, escitalopram, fluticasone-salmeterol, ipratropium-albuterol, levothyroxine, magnesium oxide, morphine, pantoprazole, senna-docusate, tizanidine, morphine, morphine, and morphine.  Current Outpatient Prescriptions on File Prior to Visit  Medication Sig  . albuterol-ipratropium (COMBIVENT) 18-103 MCG/ACT inhaler Inhale 2 puffs into the lungs every 6 (six) hours as needed.  . ALPRAZolam (XANAX) 1 MG tablet Take 1 tablet (1 mg total) by mouth 4 (four) times daily as needed.  Marland Kitchen atorvastatin (LIPITOR) 40 MG tablet Take 40 mg by mouth daily.   Marland Kitchen buPROPion (WELLBUTRIN SR) 150 MG 12 hr tablet Take 150 mg by mouth 2 (two) times daily.   . Cyanocobalamin (VITAMELTS ENERGY VITAMIN B-12) 1500 MCG TBDP Take 1 tablet by mouth daily.  Marland Kitchen escitalopram (LEXAPRO) 10 MG tablet Take 10 mg by mouth daily.   . Fluticasone-Salmeterol (ADVAIR) 100-50 MCG/DOSE AEPB Inhale 1 puff into the lungs every 12 (twelve) hours.  . Ipratropium-Albuterol (COMBIVENT) 20-100 MCG/ACT  AERS respimat Inhale 2 puffs into the lungs every 6 (six) hours as needed for wheezing or shortness of breath.   . levothyroxine (SYNTHROID, LEVOTHROID) 88 MCG tablet Take 88 mcg by mouth daily before breakfast.   . magnesium oxide (MAG-OX) 400 MG tablet Take 1 tablet (400 mg total) by mouth 2 (two) times daily.  Marland Kitchen morphine (MS CONTIN) 30 MG 12 hr tablet Take 1 tablet (30 mg total) by mouth every 12 (twelve) hours.  . pantoprazole (PROTONIX) 40 MG tablet Take 40 mg by mouth daily.   Marland Kitchen senna-docusate (SENOKOT-S) 8.6-50 MG tablet Take 1 tablet by mouth at bedtime.   . tizanidine (ZANAFLEX) 2 MG capsule Take 1 capsule (2 mg total) by mouth 3 (three) times daily as needed for muscle spasms.  Marland Kitchen morphine (MS CONTIN) 30 MG 12 hr tablet Take 1 tablet (30 mg total) by mouth every 12 (twelve) hours.  Marland Kitchen morphine (MS CONTIN) 30 MG 12 hr tablet Take 1 tablet (30 mg total) by mouth every 12 (twelve) hours.   No current facility-administered medications on file prior to visit.    ROS  Constitutional: Denies any fever or chills Gastrointestinal: No reported hemesis, hematochezia, vomiting, or acute GI distress Musculoskeletal: Denies any acute onset joint swelling, redness, loss of ROM, or weakness Neurological: No reported episodes of acute onset apraxia, aphasia, dysarthria, agnosia, amnesia, paralysis, loss of coordination, or loss of consciousness  Allergies  Ms. Lecount has No Known Allergies.  [employer]  Drug: Ms. Dabbs  reports that she does not use drugs. Alcohol:  reports that she does not drink alcohol. Tobacco:  reports that she has been smoking Cigarettes.  She has been smoking about 0.50 packs per day. She has never used smokeless tobacco. Medical:  has a past medical history of Anxiety; CAD (coronary artery disease); Carpal tunnel syndrome; Complication of anesthesia; COPD (chronic obstructive pulmonary disease) ([place]); CRP elevated (09/14/2015); Depression; Difficult intubation; Dyslipidemia;  Dysrhythmia; Elevated sedimentation rate (09/14/2015); Esophageal spasm; Gastrointestinal parasites; Hiatal hernia; History of peptic ulcer disease; Hyperthyroidism; Hypothyroidism; Low magnesium levels (09/14/2015); Pelvic fracture ([place]) (2008); Reflux; and Rotator cuff injury. Family: family history is not on file.  Past Surgical History:  Procedure Laterality Date  . CARPAL TUNNEL RELEASE    .  CESAREAN SECTION    . CHOLECYSTECTOMY    . INTRAMEDULLARY (IM) NAIL INTERTROCHANTERIC Right 10/01/2016   Procedure: INTRAMEDULLARY (IM) NAIL INTERTROCHANTRIC;  Surgeon: Corky Mull, MD;  Location: [facility];  Service: Orthopedics;  Laterality: Right;  . NECK SURGERY    . ROTATOR CUFF REPAIR     x2  . SHOULDER ARTHROSCOPY  12/07/2011   Procedure: ARTHROSCOPY SHOULDER;  Surgeon: Ninetta Lights, MD;  Location: [facility] [place];  Service: Orthopedics;  Laterality: Right;  Debridement Partial Cuff Tear, Release Coracoacromial Ligament  . SHOULDER SURGERY  12/07/2011   right   Constitutional Exam  General appearance: dehydrated over sedated and malnourished  Vitals:   05/08/17 1300  BP: 108/76  Pulse: (!) 122  Resp: 16  Temp: 98.2 F (36.8 C)  TempSrc: Oral  SpO2: 96%  Weight: 118 lb (53.5 kg)  Height: 5' 4"  (1.626 m)   BMI Assessment: Estimated body mass index is 20.25 kg/m as calculated from the following:   Height as of this encounter: 5' 4"  (1.626 m).   Weight as of this encounter: 118 lb (53.5 kg).  BMI interpretation table: BMI level Category Range association with higher incidence of chronic pain  <18 kg/m2 Underweight   18.5-24.9 kg/m2 Ideal body weight   25-29.9 kg/m2 Overweight Increased incidence by 20%  30-34.9 kg/m2 Obese (Class I) Increased incidence by 68%  35-39.9 kg/m2 Severe obesity (Class II) Increased incidence by 136%  >40 kg/m2 Extreme obesity (Class III) Increased incidence by 254%   BMI Readings from Last 4 Encounters:  05/08/17 20.25 kg/m  02/20/17  19.74 kg/m  11/28/16 19.74 kg/m  10/01/16 19.74 kg/m   Wt Readings from Last 4 Encounters:  05/08/17 118 lb (53.5 kg)  02/20/17 115 lb (52.2 kg)  11/28/16 115 lb (52.2 kg)  10/01/16 115 lb (52.2 kg)  Psych/Mental status: Alert, oriented x 3 (person, place, & time)       Eyes: PERLA Respiratory: No evidence of acute respiratory distress  Cervical Spine Exam  Inspection: No masses, redness, or swelling Alignment: Symmetrical Functional ROM: Unrestricted ROM      Stability: No instability detected Muscle strength & Tone: Functionally intact Sensory: Unimpaired Palpation: No palpable anomalies              Upper Extremity (UE) Exam    Side: Right upper extremity  Side: Left upper extremity  Inspection: No masses, redness, swelling, or asymmetry. No contractures  Inspection: No masses, redness, swelling, or asymmetry. No contractures  Functional ROM: Unrestricted ROM          Functional ROM: Unrestricted ROM          Muscle strength & Tone: Functionally intact  Muscle strength & Tone: Functionally intact  Sensory: Unimpaired  Sensory: Unimpaired  Palpation: No palpable anomalies              Palpation: No palpable anomalies              Specialized Test(s): Deferred         Specialized Test(s): Deferred          Thoracic Spine Exam  Inspection: No masses, redness, or swelling Alignment: Symmetrical Functional ROM: Unrestricted ROM Stability: No instability detected Sensory: Unimpaired Muscle strength & Tone: No palpable anomalies  Lumbar Spine Exam  Inspection: Pressure areas to left buttock Alignment: Symmetrical Functional ROM: Unrestricted ROM      Stability: No instability detected Muscle strength & Tone: Functionally intact Sensory: Unimpaired Palpation: Complains of area  being tender to palpation       Provocative Tests: Lumbar Hyperextension and rotation test: evaluation deferred today       Patrick's Maneuver: evaluation deferred today                    Gait  & Posture Assessment  Ambulation: Unassisted Gait: Relatively normal for age and body habitus Posture: WNL   Lower Extremity Exam    Side: Right lower extremity  Side: Left lower extremity  Inspection: No masses, redness, swelling, or asymmetry. No contractures  Inspection: No masses, redness, swelling, or asymmetry. No contractures  Functional ROM: Unrestricted ROM          Functional ROM: Unrestricted ROM          Muscle strength & Tone: Functionally intact  Muscle strength & Tone: Functionally intact  Sensory: Unimpaired  Sensory: Unimpaired  Palpation: No palpable anomalies  Palpation: No palpable anomalies   Assessment  Primary Diagnosis & Pertinent Problem List: The primary encounter diagnosis was Lumbar spondylosis. Diagnoses of Lumbar foraminal stenosis (L3-4) (Left), Chronic pain syndrome, B12 deficiency, and Vitamin D insufficiency were also pertinent to this visit.  Status Diagnosis  Controlled Controlled Controlled 1. Lumbar spondylosis   2. Lumbar foraminal stenosis (L3-4) (Left)   3. Chronic pain syndrome   4. B12 deficiency   5. 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)  Lumbar facet hypertrophy (Bilateral)  Lumbar facet syndrome (Location of Primary Source of Pain) (Bilateral) (R>L)  Lumbar grade 1 Anterolisthesis of L3 over L4  Chronic shoulder pain (Bilateral) (status post multiple surgeries) (R>L)  Chronic Pain Syndrome  Cervical spondylosis  Chronic neck pain (Location of Secondary source of pain) (Bilateral) (R>L)  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   Chronic Obstructive Pulmonary Disease ([place]) ([facility])  Chronic shoulder pain (Right)   MRI arthrogram done on 11/08/2011 demonstrated extensive articular surface partial thickness tear of the distal infraspinatus tendon involving the distal 3 cm of the tendon. One focal area of the tear extended 75% of the weight through the infraspinatus but without a full-thickness tear. There is also a small rim rent tear at the attachment of the infraspinatus and supraspinatus tendon on the greater tuberosity. Slight irregularity of the articular surface of the distal supraspinatus tendon is observed. Partial-thickness undersurface tear of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

## 2017-05-08 NOTE — Patient Instructions (Addendum)
____________________________________________________________________________________________  Medication Rules  Applies to: All patients receiving prescriptions (written or electronic).  Pharmacy of record: Pharmacy where electronic prescriptions will be sent. If written prescriptions are taken to a different pharmacy, please inform the nursing staff. The pharmacy listed in the electronic medical record should be the one where you would like electronic prescriptions to be sent.  Prescription refills: Only during scheduled appointments. Applies to both, written and electronic prescriptions.  NOTE: The following applies primarily to controlled substances (Opioid Pain Medications)  Patient's responsibilities: 1. Pain Pills: Bring all pain pills to every appointment (except for procedure appointments). 2. Pill Bottles: Bring pills in original pharmacy bottle. Always bring newest bottle. Bring bottle, even if empty. 3. Medication refills: You are responsible for knowing and keeping track of what medications you need refilled. The day before your appointment, write a list of all prescriptions that need to be refilled. Bring that list to your appointment and give it to the admitting nurse. Prescriptions will be written only during appointments. If you forget a medication, it will not be "Called in", "Faxed", or "electronically sent". You will need to get another appointment to get these prescribed. 4. Prescription Accuracy: You are responsible for carefully inspecting your prescriptions before leaving our office. Have the discharge nurse carefully go over each prescription with you, before taking them home. Make sure that your name is accurately spelled, that your address is correct. Check the name and dose of your medication to make sure it is accurate. Check the number of pills, and the written instructions to make sure they are clear and accurate. Make sure that you are given enough medication to last  until your next medication refill appointment. 5. Taking Medication: Take medication as prescribed. Never take more pills than instructed. Never take medication more frequently than prescribed. Taking less pills or less frequently is permitted and encouraged, when it comes to controlled substances (written prescriptions).  6. Inform other Doctors: Always inform, all of your healthcare providers, of all the medications you take. 7. Pain Medication from other Providers: You are not allowed to accept any additional pain medication from any other Doctor or Healthcare provider. There are two exceptions to this rule. (see below) In the event that you require additional pain medication, you are responsible for notifying us, as stated below. 8. Medication Agreement: You are responsible for carefully reading and following our Medication Agreement. This must be signed before receiving any prescriptions from our practice. Safely store a copy of your signed Agreement. Violations to the Agreement will result in no further prescriptions. (Additional copies of our Medication Agreement are available upon request.) 9. Laws, Rules, & Regulations: All patients are expected to follow all Federal and [employer], [employer], Rules, [employer]. Ignorance of the Laws does not constitute a valid excuse.  Exceptions: There are only two exceptions to the rule of not receiving pain medications from other Healthcare Providers. 1. Exception #1 (Emergencies): In the event of an emergency (i.e.: accident requiring emergency care), you are allowed to receive additional pain medication. However, you are responsible for: As soon as you are able, call our office [phone], at any time of the day or night, and leave a message stating your name, the date and nature of the emergency, and the name and dose of the medication prescribed. In the event that your call is answered by a member of our staff, make sure to document and save the date,  time, and the name of the person that  took your information.  2. Exception #2 (Planned Surgery): In the event that you are scheduled by another doctor or dentist to have any type of surgery or procedure, you are allowed (for a period no longer than 30 days), to receive additional pain medication, for the acute post-op pain. However, in this case, you are responsible for picking up a copy of our "Post-op Pain Management for Surgeons" handout, and giving it to your surgeon or dentist. This document is available at our office, and does not require an appointment to obtain it. Simply go to our office during business hours (Monday-Thursday from 8:00 AM to 4:00 PM) (Friday 8:00 AM to 12:00 Noon) or if you have a scheduled appointment with [place], prior to your surgery, and ask for it by name. In addition, you will need to provide [place] with your name, name of your surgeon, type of surgery, and date of procedure or surgery.  A prescription for Vitamin D3 was sent to your pharmacy. You were given one prescription for MS Contin today.   ____________________________________________________________________________________________

## 2017-05-08 NOTE — Telephone Encounter (Addendum)
Patient calling about getting med refill, has only been out 1 time since she broke her hip. Is trying to work out transportation. Very confused. Does not understand that she has to come in for an appt to get next med refill and that she should have enough to last until July 9. I have asked that Hassan Rowan, who is going to bring her, call me to discuss any necessary changes to her appts.

## 2017-05-10 ENCOUNTER — Ambulatory Visit: Payer: Medicare HMO | Admitting: Nurse Practitioner

## 2017-05-29 NOTE — Telephone Encounter (Signed)
NA

## 2017-05-30 DIAGNOSIS — R0600 Dyspnea, unspecified: Secondary | ICD-10-CM | POA: Diagnosis not present

## 2017-05-30 DIAGNOSIS — J449 Chronic obstructive pulmonary disease, unspecified: Secondary | ICD-10-CM | POA: Diagnosis not present

## 2017-06-04 NOTE — Progress Notes (Addendum)
Patient's Name: [name]  MRN: [id number]  Referring Provider: Albina Billet, MD  DOB: 1946/07/22  PCP: Albina Billet, MD  DOS: 06/05/2017  Note by: Gaspar Cola, MD  Service setting: Ambulatory outpatient  Specialty: Interventional Pain Management  Location: [facility] (AMB) Pain Management Facility    Patient type: Established   Primary Reason(s) for Visit: Encounter for prescription drug management. (Level of risk: moderate)  CC: Back Pain (lower)  HPI  [name] is a 71 y.o. year old, female patient, who comes today for a medication management evaluation. 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); Cervical facet syndrome (Location of Secondary source of pain) (Bilateral) (R>L); Cervical myofascial pain syndrome; Chronic low back pain (Location of Primary Source of Pain) (Bilateral) (R>L); Lumbar spondylosis; Chronic shoulder impingement syndrome (Right); Low magnesium levels; CRP elevated; Elevated sedimentation rate; Chronic obstructive pulmonary disease (COPD) ([place]); Nicotine dependence; Chronic shoulder pain (Right); Abnormal nerve conduction studies; Chronic shoulder pain (Bilateral) (status post multiple surgeries) (R>L); Cervical facet hypertrophy (Bilateral); History of shoulder surgery 5 (Right); Lumbar foraminal stenosis (L3-4) (Left); 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 Her primarily concern today is the Back Pain (lower)  Pain Assessment: Location: Lower, Right Back Radiating: right buttocks around to groin Onset: More than a month ago Duration: Chronic pain Quality: Burning, Sharp, Radiating, Discomfort Severity: 8 /10 (self-reported pain score)  Note: Reported level is compatible with observation.                   Effect on ADL: unable to sleep, stand, cooking Timing: Constant Modifying factors: nothing  [name] was last scheduled for an appointment on 05/08/2017 for medication management. During today's appointment we reviewed [name] chronic pain status, as well as her outpatient medication regimen.  The patient  reports that she does not use drugs. Her body mass index is 16.82 kg/m.  Further details on both, my assessment(s), as well as the proposed treatment plan, please see below.  Controlled Substance Pharmacotherapy Assessment REMS (Risk Evaluation and Mitigation Strategy)  Analgesic:Morphine ER 15 mg every 12 hours (30 mg per day) MME/day:30 mg/day.   Ignatius Specking, RN  06/05/2017  7:30 PM  Signed Nursing Pain Medication Assessment:  Safety precautions to be maintained throughout the outpatient stay will include: orient to surroundings, keep bed in low position, maintain call bell within reach at all times, provide assistance with transfer out of bed and ambulation.  Medication Inspection Compliance: Pill count conducted under aseptic conditions, in front of the patient. Neither the pills nor the bottle was removed from the patient's sight at any time. Once count was completed pills were immediately returned to the patient in  their original bottle.  Medication: See above Pill/Patch Count: 0 of 60 pills remain Pill/Patch  Appearance: Markings consistent with prescribed medication Bottle Appearance: Standard pharmacy container. Clearly labeled. Filled Date: 6 / 109 / 2018 Last Medication intake:  Today   Pharmacokinetics: Liberation and absorption (onset of action): WNL Distribution (time to peak effect): WNL Metabolism and excretion (duration of action): WNL         Pharmacodynamics: Desired effects: Analgesia: [name] reports >50% benefit. Functional ability: Patient reports that medication allows her to accomplish basic ADLs Clinically meaningful improvement in function (CMIF): Sustained CMIF goals met Perceived effectiveness: Described as relatively effective, allowing for increase in activities of daily living (ADL) Undesirable effects: Side-effects or Adverse reactions: None reported Monitoring: [place] PMP: Online review of the past 53-monthperiod conducted. Compliant with practice rules and regulations List of all UDS test(s) done:  Lab Results  Component Value Date   TOXASSSELUR FINAL 11/28/2016   TS[place]FINAL 05/18/2016   TOoliticFINAL 02/28/2016   [place]AL 12/08/2015   Last UDS on record: Tox[employer] Select 13  Date Value Ref Range Status  11/28/2016 FINAL  Final    Comment:    ==================================================================== TOXASSURE SELECT 13 (MW) ==================================================================== Test                             Result       Flag       Units Drug Present and Declared for Prescription Verification   Alprazolam                     173          EXPECTED   ng/mg creat   Alpha-hydroxyalprazolam        >2058        EXPECTED   ng/mg creat    Source of alprazolam is a scheduled prescription medication.    Alpha-hydroxyalprazolam is an expected metabolite of alprazolam.   Morphine                       1221         EXPECTED    ng/mg creat    Potential sources of large amounts of morphine in the absence of    codeine include administration of morphine or use of heroin.   Oxymorphone                    126          EXPECTED   ng/mg creat   Noroxycodone                   279          EXPECTED   ng/mg creat   Noroxymorphone                 52           EXPECTED   ng/mg creat    Oxymorphone, noroxycodone and noroxymorphone are expected    metabolites of oxycodone. Noroxymorphone is an expected    metabolite of oxymorphone. Sources of oxycodone and/or    oxymorphone include scheduled prescription medications. Drug Present not Declared for Prescription Verification   Hydromorphone                  112  (10%)        UNEXPECTED ng/mg creat    Hydromorphone may be administered as a scheduled prescription    medication and is also an expected metabolite of hydrocodone.  Hydromorphone is also a minor metabolite of morphine which is    also present in this specimen. Concentrations of hydromorphone    rarely exceed 5% of the morphine concentration when metabolism of    morphine is the sole source of hydromorphone. Drug Absent but Declared for Prescription Verification   Oxycodone                      Not Detected UNEXPECTED ng/mg creat    Oxycodone is almost always present in patients taking this drug    consistently.  Absence of oxycodone could be due to lapse of time    since the last dose or unusual pharmacokinetics (rapid    metabolism). ==================================================================== Test                      Result    Flag   Units      Ref Range   Creatinine              243              mg/dL      >=20 ==================================================================== Declared Medications:  The flagging and interpretation on this report are based on the  following declared medications.  Unexpected results may arise from  inaccuracies in the declared medications.  **Note: The testing scope  of this panel includes these medications:  Alprazolam  Morphine (Morphine Sulfate)  Oxycodone  **Note: The testing scope of this panel does not include following  reported medications:  Albuterol (Ipratropium-Albuterol)  Atorvastatin  Bupropion  Cholecalciferol  Docusate (Senokot-S)  Escitalopram  Fluticasone  Ipratropium (Ipratropium-Albuterol)  Levothyroxine  Magnesium Oxide  Pantoprazole  Salmeterol  Sennosides (Senokot-S)  Tizanidine  Trazodone ==================================================================== For clinical consultation, please call [phone]. ====================================================================    UDS interpretation: Unexpected findings: Patient informed of the CDC guidelines and recommendations to stay away from the concomitant use of benzodiazepines and opioids due to the increased risk of respiratory depression and death. Medication Assessment Form: Reviewed. Patient indicates being compliant with therapy Treatment compliance: Deficiencies noted and steps taken to remind the patient of the seriousness of adequate therapy compliance Risk Assessment Profile: Aberrant behavior: aggressive complaining about need for higher doses or stronger medication, claims that "nothing else works", continued use despite claims of ineffective analgesia, diminished ability to recognize a problem with one's behavior or use of the medication, drug seeking behavior, early requests for medication refills, extensive time discussing medicaiton, frequent request for higher doses, inability to consider abstinence, missing appointments for interventional therapies, repeated negotiations to obtain more medication and request for specific drugs or requesting "Brand Name" drugs Comorbid factors increasing risk of overdose: concomitant use of Benzodiazepines, COPD or asthma and nicotine dependence Risk of substance use disorder (SUD): Low Opioid Risk Tool (ORT) Total  Score: 3  Interpretation Table:  Score <3 = Low Risk for SUD  Score between 4-7 = Moderate Risk for SUD  Score >8 = High Risk for Opioid Abuse   Risk Mitigation Strategies:  Patient Counseling: Covered Patient-Prescriber Agreement (PPA): Present and active  Notification to other healthcare providers: Done  Pharmacologic Plan: No change in therapy, at this time  Laboratory Chemistry  Inflammation Markers (CRP: Acute Phase) (ESR: Chronic Phase) Lab Results  Component Value Date   CRP 2.6 (H) 02/20/2017   ESRSEDRATE 45 (H) 02/20/2017                 Renal Function Markers Lab Results  Component  Value Date   BUN 9 10/04/2016   CREATININE 0.79 10/04/2016   GFRAA >60 10/04/2016   GFRNONAA >60 10/04/2016                 Hepatic Function Markers Lab Results  Component Value Date   AST 30 10/01/2016   ALT 25 10/01/2016   ALBUMIN 3.4 (L) 10/01/2016   ALKPHOS 67 10/01/2016                 Electrolytes Lab Results  Component Value Date   NA 138 10/04/2016   K 4.8 10/04/2016   CL 101 10/04/2016   CALCIUM 8.2 (L) 10/04/2016   MG 1.7 02/20/2017                 Neuropathy Markers Lab Results  Component Value Date   VITAMINB12 232 02/20/2017                 Bone Pathology Markers Lab Results  Component Value Date   ALKPHOS 67 10/01/2016   25OHVITD1 25 (L) 02/20/2017   25OHVITD2 3.8 02/20/2017   25OHVITD3 21 02/20/2017   CALCIUM 8.2 (L) 10/04/2016                 Coagulation Parameters Lab Results  Component Value Date   INR 1.08 10/01/2016   LABPROT 14.0 10/01/2016   PLT 405 02/20/2017                 Cardiovascular Markers Lab Results  Component Value Date   HGB 11.5 (L) 02/20/2017   HCT 35.0 02/20/2017                 Note: Lab results reviewed.  Recent Diagnostic Imaging Review  Dg Hip Unilat W Or W/o Pelvis 2-3 Views Right Result Date: 02/20/2017 CLINICAL DATA:  Initial evaluation for right hip and femur pain, status post IM nail placement on  10/01/2016. EXAM: DG HIP (WITH OR WITHOUT PELVIS) 2-3V RIGHT; RIGHT FEMUR 2 VIEWS COMPARISON:  Prior radiograph from 10/01/2016. FINDINGS: There has been interval placement of an intramedullary fixation nail into the right femur for previously identified comminuted intratrochanteric femur fracture. B nail along with the proximal and distal interlocking screws appear well positioned. No periprosthetic lucency to suggest failure. There has been some interval healing about the intertrochanteric fracture. Persistent mild displacement at the greater and lesser trochanteric fracture fragments. Femoral head in normal line with the acetabulum. Bony pelvis intact. Limited views of the left hip demonstrate no acute abnormality. 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 Degenerative changes noted within the lower lumbar spine, stable. The Degenerative osteoarthritic changes noted at the right knee. No acute soft tissue abnormality. Vascular calcifications noted within the thigh. IMPRESSION: 1. Interval placement of IM fixation nail for previous intertrochanteric right femur fracture. No complication or evidence for hardware failure. 2. Interval healing about the comminuted intertrochanteric right femoral fracture. 3. No new acute osseous abnormality identified about the right hip or femur. 4. Atherosclerosis. Electronically Signed   By: Jeannine Boga M.D.   On: 02/20/2017 16:02   Dg Femur, Min 2 Views Right Result Date: 02/20/2017 CLINICAL DATA:  Initial evaluation for right hip and femur pain, status post IM nail placement on 10/01/2016. EXAM: DG HIP (WITH OR WITHOUT PELVIS) 2-3V RIGHT; RIGHT FEMUR 2 VIEWS COMPARISON:  Prior radiograph from 10/01/2016. FINDINGS: There has been interval placement of an intramedullary fixation nail into the right femur for previously identified comminuted intratrochanteric femur fracture. B  nail along with the proximal and distal interlocking screws appear  well positioned. No periprosthetic lucency to suggest failure. There has been some interval healing about the intertrochanteric fracture. Persistent mild displacement at the greater and lesser trochanteric fracture fragments. Femoral head in normal line with the acetabulum. Bony pelvis intact. Limited views of the left hip demonstrate no acute abnormality. 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 Degenerative changes noted within the lower lumbar spine, stable. The Degenerative osteoarthritic changes noted at the right knee. No acute soft tissue abnormality. Vascular calcifications noted within the thigh. IMPRESSION: 1. Interval placement of IM fixation nail for previous intertrochanteric right femur fracture. No complication or evidence for hardware failure. 2. Interval healing about the comminuted intertrochanteric right femoral fracture. 3. No new acute osseous abnormality identified about the right hip or femur. 4. Atherosclerosis. Electronically Signed   By: Jeannine Boga M.D.   On: 02/20/2017 16:02   Note: Imaging results reviewed.          Meds   Current Meds  Medication Sig  . albuterol-ipratropium (COMBIVENT) 18-103 MCG/ACT inhaler Inhale 2 puffs into the lungs every 6 (six) hours as needed.  . ALPRAZolam (XANAX) 1 MG tablet Take 1 tablet (1 mg total) by mouth 4 (four) times daily as needed.  Marland Kitchen buPROPion (WELLBUTRIN SR) 150 MG 12 hr tablet Take 150 mg by mouth 2 (two) times daily.   . Cholecalciferol (VITAMIN D3) 5000 units TABS Take 1 tablet (5,000 Units total) by mouth daily.  . cyanocobalamin ([employer] VITAMIN B12) 2000 MCG tablet Take 1 tablet (2,000 mcg total) by mouth daily.  Marland Kitchen escitalopram (LEXAPRO) 10 MG tablet Take 10 mg by mouth daily.   . Fluticasone-Salmeterol (ADVAIR) 100-50 MCG/DOSE AEPB Inhale 1 puff into the lungs every 12 (twelve) hours.  . Ipratropium-Albuterol (COMBIVENT) 20-100 MCG/ACT AERS respimat Inhale 2 puffs into the lungs every 6 (six)  hours as needed for wheezing or shortness of breath.   . levothyroxine (SYNTHROID, LEVOTHROID) 88 MCG tablet Take 88 mcg by mouth daily before breakfast.   . morphine (MS CONTIN) 15 MG 12 hr tablet Take 1 tablet (15 mg total) by mouth every 12 (twelve) hours.  Marland Kitchen oxyCODONE (OXY IR/ROXICODONE) 5 MG immediate release tablet Take 1 tablet (5 mg total) by mouth 5 (five) times daily. Max: 5/day  . [START ON 06/12/2017] oxyCODONE (OXY IR/ROXICODONE) 5 MG immediate release tablet Take 1 tablet (5 mg total) by mouth 4 (four) times daily. Max: 4/day  . [START ON 06/19/2017] oxyCODONE (OXY IR/ROXICODONE) 5 MG immediate release tablet Take 1 tablet (5 mg total) by mouth 3 (three) times daily. Max: 3/day  . [START ON 06/26/2017] oxyCODONE (OXY IR/ROXICODONE) 5 MG immediate release tablet Take 1 tablet (5 mg total) by mouth 2 (two) times daily. Max: 2/day  . [START ON 07/03/2017] oxyCODONE (OXY IR/ROXICODONE) 5 MG immediate release tablet Take 1 tablet (5 mg total) by mouth daily. Max: 1/day  . pantoprazole (PROTONIX) 40 MG tablet Take 40 mg by mouth daily.   Marland Kitchen senna-docusate (SENOKOT-S) 8.6-50 MG tablet Take 1 tablet by mouth at bedtime.     ROS  Constitutional: Denies any fever or chills Gastrointestinal: No reported hemesis, hematochezia, vomiting, or acute GI distress Musculoskeletal: Denies any acute onset joint swelling, redness, loss of ROM, or weakness Neurological: No reported episodes of acute onset apraxia, aphasia, dysarthria, agnosia, amnesia, paralysis, loss of coordination, or loss of consciousness  Allergies  Ms. Hoar has No Known Allergies.  PFSH  Drug: Ms.  Sefcik  reports that she does not use drugs. Alcohol:  reports that she does not drink alcohol. Tobacco:  reports that she has been smoking Cigarettes.  She has been smoking about 0.50 packs per day. She has never used smokeless tobacco. Medical:  has a past medical history of Anxiety; CAD (coronary artery disease); Carpal tunnel syndrome;  Complication of anesthesia; COPD (chronic obstructive pulmonary disease) ([place]); CRP elevated (09/14/2015); Depression; Difficult intubation; Dyslipidemia; Dysrhythmia; Elevated sedimentation rate (09/14/2015); Esophageal spasm; Gastrointestinal parasites; Hiatal hernia; History of peptic ulcer disease; Hyperthyroidism; Hypothyroidism; Low magnesium levels (09/14/2015); Pelvic fracture ([place]) (2008); Reflux; and Rotator cuff injury. Surgical: Ms. Vitrano  has a past surgical history that includes Cesarean section; Neck surgery; Cholecystectomy; Carpal tunnel release; Rotator cuff repair; Shoulder arthroscopy (12/07/2011); Shoulder surgery (12/07/2011); and Intramedullary (im) nail intertrochanteric (Right, 10/01/2016). Family: family history includes Aneurysm in her unknown relative.  Constitutional Exam  General appearance: Well nourished, well developed, and well hydrated. In no apparent acute distress Vitals:   06/05/17 1111  BP: (!) 150/88  Pulse: 80  Temp: 98 F (36.7 C)  SpO2: (!) 86%  Weight: 98 lb (44.5 kg)  Height: 5' 4"  (1.626 m)   BMI Assessment: Estimated body mass index is 16.82 kg/m as calculated from the following:   Height as of this encounter: 5' 4"  (1.626 m).   Weight as of this encounter: 98 lb (44.5 kg).  BMI interpretation table: BMI level Category Range association with higher incidence of chronic pain  <18 kg/m2 Underweight   18.5-24.9 kg/m2 Ideal body weight   25-29.9 kg/m2 Overweight Increased incidence by 20%  30-34.9 kg/m2 Obese (Class I) Increased incidence by 68%  35-39.9 kg/m2 Severe obesity (Class II) Increased incidence by 136%  >40 kg/m2 Extreme obesity (Class III) Increased incidence by 254%   BMI Readings from Last 4 Encounters:  06/05/17 16.82 kg/m  05/08/17 20.25 kg/m  02/20/17 19.74 kg/m  11/28/16 19.74 kg/m   Wt Readings from Last 4 Encounters:  06/05/17 98 lb (44.5 kg)  05/08/17 118 lb (53.5 kg)  02/20/17 115 lb (52.2 kg)  11/28/16 115 lb  (52.2 kg)  Psych/Mental status: Alert, oriented x 3 (person, place, & time)       Eyes: PERLA Respiratory: No evidence of acute respiratory distress  Cervical Spine Exam  Inspection: No masses, redness, or swelling Alignment: Symmetrical Functional ROM: Unrestricted ROM      Stability: No instability detected Muscle strength & Tone: Functionally intact Sensory: Unimpaired Palpation: No palpable anomalies              Upper Extremity (UE) Exam    Side: Right upper extremity  Side: Left upper extremity  Inspection: No masses, redness, swelling, or asymmetry. No contractures  Inspection: No masses, redness, swelling, or asymmetry. No contractures  Functional ROM: Unrestricted ROM          Functional ROM: Unrestricted ROM          Muscle strength & Tone: Functionally intact  Muscle strength & Tone: Functionally intact  Sensory: Unimpaired  Sensory: Unimpaired  Palpation: No palpable anomalies              Palpation: No palpable anomalies              Specialized Test(s): Deferred         Specialized Test(s): Deferred          Thoracic Spine Exam  Inspection: No masses, redness, or swelling Alignment: Symmetrical Functional ROM: Unrestricted ROM Stability: No  instability detected Sensory: Unimpaired Muscle strength & Tone: No palpable anomalies  Lumbar Spine Exam  Inspection: No masses, redness, or swelling Alignment: Symmetrical Functional ROM: Decreased ROM      Stability: No instability detected Muscle strength & Tone: Functionally intact Sensory: Movement-associated pain Palpation: Complains of area being tender to palpation       Provocative Tests: Lumbar Hyperextension and rotation test: Positive bilaterally for facet joint pain. Patrick's Maneuver: Positive for bilateral S-I arthralgia              Gait & Posture Assessment  Ambulation: Unassisted Gait: Relatively normal for age and body habitus Posture: WNL   Lower Extremity Exam    Side: Right lower extremity   Side: Left lower extremity  Inspection: No masses, redness, swelling, or asymmetry. No contractures  Inspection: No masses, redness, swelling, or asymmetry. No contractures  Functional ROM: Unrestricted ROM          Functional ROM: Unrestricted ROM          Muscle strength & Tone: Functionally intact  Muscle strength & Tone: Functionally intact  Sensory: Unimpaired  Sensory: Unimpaired  Palpation: No palpable anomalies  Palpation: No palpable anomalies   Assessment  Primary Diagnosis & Pertinent Problem List: The primary encounter diagnosis was Chronic low back pain (Location of Primary Source of Pain) (Bilateral) (R>L). Diagnoses of Chronic neck pain (Location of Secondary source of pain) (Bilateral) (R>L), Chronic sacroiliac joint pain (Right), Cervical facet syndrome (Location of Secondary source of pain) (Bilateral) (R>L), Lumbar facet hypertrophy (Bilateral), Lumbar grade 1 Anterolisthesis of L3 over L4, Lumbar facet syndrome (Location of Primary Source of Pain) (Bilateral) (R>L), Spinal stenosis of lumbar region with neurogenic claudication, Lumbar foraminal stenosis (L3-4) (Left), Lumbar spondylosis, Chronic pain syndrome, Substance use disorder Risk: High, Long term prescription opiate use, and Opiate use were also pertinent to this visit.  Status Diagnosis  Worsening Controlled Worsening 1. Chronic low back pain (Location of Primary Source of Pain) (Bilateral) (R>L)   2. Chronic neck pain (Location of Secondary source of pain) (Bilateral) (R>L)   3. Chronic sacroiliac joint pain (Right)   4. Cervical facet syndrome (Location of Secondary source of pain) (Bilateral) (R>L)   5. Lumbar facet hypertrophy (Bilateral)   6. Lumbar grade 1 Anterolisthesis of L3 over L4   7. Lumbar facet syndrome (Location of Primary Source of Pain) (Bilateral) (R>L)   8. Spinal stenosis of lumbar region with neurogenic claudication   9. Lumbar foraminal stenosis (L3-4) (Left)   10. Lumbar spondylosis   11.  Chronic pain syndrome   12. Substance use disorder Risk: High   13. Long term prescription opiate use   14. Opiate use     Problems updated and reviewed during this visit: No problems updated. Plan of Care  Pharmacotherapy (Medications Ordered): Meds ordered this encounter  Medications  . orphenadrine (NORFLEX) injection 60 mg  . ketorolac (TORADOL) injection 60 mg  . oxyCODONE (OXY IR/ROXICODONE) 5 MG immediate release tablet    Sig: Take 1 tablet (5 mg total) by mouth 5 (five) times daily. Max: 5/day    Dispense:  35 tablet    Refill:  0    This prescription is part of a downward opioid taper. Fill instructions must be followed exactly as written to avoid withdrawal. Fill date: 06/05/17 To last until: 06/12/17  . oxyCODONE (OXY IR/ROXICODONE) 5 MG immediate release tablet    Sig: Take 1 tablet (5 mg total) by mouth 4 (four) times daily. Max: 4/day  Dispense:  28 tablet    Refill:  0    This prescription is part of a downward opioid taper. Fill instructions must be followed exactly as written to avoid withdrawal. Fill date: 06/12/17 To last until: 06/19/17  . oxyCODONE (OXY IR/ROXICODONE) 5 MG immediate release tablet    Sig: Take 1 tablet (5 mg total) by mouth 3 (three) times daily. Max: 3/day    Dispense:  21 tablet    Refill:  0    This prescription is part of a downward opioid taper. Fill instructions must be followed exactly as written to avoid withdrawal. Fill date: 06/19/17 To last until: 06/26/17  . oxyCODONE (OXY IR/ROXICODONE) 5 MG immediate release tablet    Sig: Take 1 tablet (5 mg total) by mouth 2 (two) times daily. Max: 2/day    Dispense:  14 tablet    Refill:  0    This prescription is part of a downward opioid taper. Fill instructions must be followed exactly as written to avoid withdrawal. Fill date: 06/26/17 To last until: 07/03/17  . oxyCODONE (OXY IR/ROXICODONE) 5 MG immediate release tablet    Sig: Take 1 tablet (5 mg total) by mouth daily. Max: 1/day     Dispense:  7 tablet    Refill:  0    This prescription is part of a downward opioid taper. Fill instructions must be followed exactly as written to avoid withdrawal. Fill date: 07/03/17 To last until: 07/10/17   New Prescriptions   OXYCODONE (OXY IR/ROXICODONE) 5 MG IMMEDIATE RELEASE TABLET    Take 1 tablet (5 mg total) by mouth 5 (five) times daily. Max: 5/day   OXYCODONE (OXY IR/ROXICODONE) 5 MG IMMEDIATE RELEASE TABLET    Take 1 tablet (5 mg total) by mouth 4 (four) times daily. Max: 4/day   OXYCODONE (OXY IR/ROXICODONE) 5 MG IMMEDIATE RELEASE TABLET    Take 1 tablet (5 mg total) by mouth 3 (three) times daily. Max: 3/day   OXYCODONE (OXY IR/ROXICODONE) 5 MG IMMEDIATE RELEASE TABLET    Take 1 tablet (5 mg total) by mouth 2 (two) times daily. Max: 2/day   OXYCODONE (OXY IR/ROXICODONE) 5 MG IMMEDIATE RELEASE TABLET    Take 1 tablet (5 mg total) by mouth daily. Max: 1/day   Medications administered today: We administered orphenadrine and ketorolac.  Lab-work, procedure(s), and/or referral(s): Orders Placed This Encounter  Procedures  . LUMBAR FACET(MEDIAL BRANCH NERVE BLOCK) MBNB  . SACROILIAC JOINT INJECTINS  . MR LUMBAR SPINE W WO CONTRAST  . DG Lumbar Spine Complete W/Bend  . DG Si Joints  . ToxASSURE Select 13 (MW), Urine  . Comprehensive metabolic panel  . C-reactive protein  . Sedimentation rate  . Magnesium  . 25-Hydroxyvitamin D Lcms D2+D3  . Vitamin B12    Interventional management options: Planned, scheduled, and/or pending:   Diagnostic right-sided  Lumbar facet + sacroiliac joint block under fluoroscopic guidance and IV sedation    Considering:  Diagnostic bilateral cervical facetblock  Possible bilateral cervical facet radiofrequencyablation  Diagnostic right-sided cervical epidural steroid injection  Diagnostic bilateral intra-articular shoulderjoint injection  Diagnostic bilateral suprascapularnerve block  Possible bilateral suprascapular nerve  radiofrequencyablation  Diagnostic bilateral lumbar facet block  Possible bilateral lumbar facet radiofrequencyablation  Diagnostic left-sided L3-4 transforaminal epiduralsteroid injection  Diagnostic L3-4 versus L4-5 lumbar epiduralsteroid injection    Palliative PRN treatment(s):   Diagnostic bilateral cervical facetblock  Diagnostic right-sided cervical epiduralsteroid injection  Diagnostic bilateral intra-articular shoulderjoint injection  Diagnostic bilateral suprascapularnerve block  Diagnostic bilateral  lumbar facetblock  Diagnostic left-sided L3-4 transforaminal epiduralsteroid injection  Diagnostic L3-4 versus L4-5 lumbar epiduralsteroid injection    Provider-requested follow-up: Return in 7 weeks (on 07/23/2017) for Med-Mgmt, by MD, in addition, procedure (w/ sedation), (ASAP), w/ MD.  Future Appointments Date Time Provider [place]  07/04/2017 1:30 PM Milinda Pointer, MD [facility] None   Primary Care Physician: Albina Billet, MD Location: [facility] Note by: Gaspar Cola, MD Date: 06/05/2017; Time: 7:24 PM  Patient Instructions  __You have been given 5 prescriptions for oxycodone today.  They are numbered in the porder in which they should be filled.  You have been scheduled for xrays, labs and an MRI.  You have been ordered a facet block with sedation.  Do not eat or drink for 8 hours prior to procedure, bring a driver.___Your previous prescription for Morphine was discontinued. ______________________________________________________________________________________  Preparing for Procedure with Sedation Instructions: . Oral Intake: Do not eat or drink anything for at least 8 hours prior to your procedure. . Transportation: Public transportation is not allowed. Bring an adult driver. The driver must be physically present in our waiting room before any procedure can be started. Marland Kitchen Physical Assistance: Bring an adult  physically capable of assisting you, in the event you need help. This adult should keep you company at home for at least 6 hours after the procedure. . Blood Pressure Medicine: Take your blood pressure medicine with a sip of water the morning of the procedure. . Blood thinners:  . Diabetics on insulin: Notify the staff so that you can be scheduled 1st case in the morning. If your diabetes requires high dose insulin, take only  of your normal insulin dose the morning of the procedure and notify the staff that you have done so. . Preventing infections: Shower with an antibacterial soap the morning of your procedure. . Build-up your immune system: Take 1000 mg of Vitamin C with every meal (3 times a day) the day prior to your procedure. Marland Kitchen Antibiotics: Inform the staff if you have a condition or reason that requires you to take antibiotics before dental procedures. . Pregnancy: If you are pregnant, call and cancel the procedure. . Sickness: If you have a cold, fever, or any active infections, call and cancel the procedure. . Arrival: You must be in the facility at least 30 minutes prior to your scheduled procedure. . Children: Do not bring children with you. . Dress appropriately: Bring dark clothing that you would not mind if they get stained. . Valuables: Do not bring any jewelry or valuables. Procedure appointments are reserved for interventional treatments only. Marland Kitchen No Prescription Refills. . No medication changes will be discussed during procedure appointments. . No disability issues will be discussed. ____________________________________________________________________________________________  Preparing for Procedure with Sedation Instructions: . Oral Intake: Do not eat or drink anything for at least 8 hours prior to your procedure. . Transportation: Public transportation is not allowed. Bring an adult driver. The driver must be physically present in our waiting room before any procedure can  be started. Marland Kitchen Physical Assistance: Bring an adult capable of physically assisting you, in the event you need help. . Blood Pressure Medicine: Take your blood pressure medicine with a sip of water the morning of the procedure. . Insulin: Take only  of your normal insulin dose. . Preventing infections: Shower with an antibacterial soap the morning of your procedure. . Build-up your immune system: Take 1000 mg of Vitamin C with every meal (3  times a day) the day prior to your procedure. . Pregnancy: If you are pregnant, call and cancel the procedure. . Sickness: If you have a cold, fever, or any active infections, call and cancel the procedure. . Arrival: You must be in the facility at least 30 minutes prior to your scheduled procedure. . Children: Do not bring children with you. . Dress appropriately: Bring dark clothing that you would not mind if they get stained. . Valuables: Do not bring any jewelry or valuables. Procedure appointments are reserved for interventional treatments only. Marland Kitchen No Prescription Refills. . No medication changes will be discussed during procedure appointments. No disability issues will be discussed.Facet Blocks Patient Information  Description: The facets are joints in the spine between the vertebrae.  Like any joints in the body, facets can become irritated and painful.  Arthritis can also effect the facets.  By injecting steroids and local anesthetic in and around these joints, we can temporarily block the nerve supply to them.  Steroids act directly on irritated nerves and tissues to reduce selling and inflammation which often leads to decreased pain.  Facet blocks may be done anywhere along the spine from the neck to the low back depending upon the location of your pain.   After numbing the skin with local anesthetic (like Novocaine), a small needle is passed onto the facet joints under x-ray guidance.  You may experience a sensation of pressure while this is being  done.  The entire block usually lasts about 15-25 minutes.   Conditions which may be treated by facet blocks:  Low back/buttock pain Neck/shoulder pain Certain types of headaches  Preparation for the injection:  Do not eat any solid food or dairy products within 8 hours of your appointment. You may drink clear liquid up to 3 hours before appointment.  Clear liquids include water, black coffee, juice or soda.  No milk or cream please. You may take your regular medication, including pain medications, with a sip of water before your appointment.  Diabetics should hold regular insulin (if taken separately) and take 1/2 normal NPH dose the morning of the procedure.  Carry some sugar containing items with you to your appointment. A driver must accompany you and be prepared to drive you home after your procedure. Bring all your current medications with you. An IV may be inserted and sedation may be given at the discretion of the physician. A blood pressure cuff, EKG and other monitors will often be applied during the procedure.  Some patients may need to have extra oxygen administered for a short period. You will be asked to provide medical information, including your allergies and medications, prior to the procedure.  We must know immediately if you are taking blood thinners (like Coumadin/Warfarin) or if you are allergic to IV iodine contrast (dye).  We must know if you could possible be pregnant.  Possible side-effects:  Bleeding from needle site Infection (rare, may require surgery) Nerve injury (rare) Numbness & tingling (temporary) Difficulty urinating (rare, temporary) Spinal headache (a headache worse with upright posture) Light-headedness (temporary) Pain at injection site (serveral days) Decreased blood pressure (rare, temporary) Weakness in arm/leg (temporary) Pressure sensation in back/neck (temporary)   Call if you experience:  Fever/chills associated with headache or  increased back/neck pain Headache worsened by an upright position New onset, weakness or numbness of an extremity below the injection site Hives or difficulty breathing (go to the emergency room) Inflammation or drainage at the injection site(s) Severe back/neck pain  greater than usual New symptoms which are concerning to you  Please note:  Although the local anesthetic injected can often make your back or neck feel good for several hours after the injection, the pain will likely return. It takes 3-7 days for steroids to work.  You may not notice any pain relief for at least one week.  If effective, we will often do a series of 2-3 injections spaced 3-6 weeks apart to maximally decrease your pain.  After the initial series, you may be a candidate for a more permanent nerve block of the facets.  If you have any questions, please call #[phone] . [facility]

## 2017-06-05 ENCOUNTER — Telehealth: Payer: Self-pay

## 2017-06-05 ENCOUNTER — Other Ambulatory Visit: Payer: Self-pay | Admitting: Pain Medicine

## 2017-06-05 ENCOUNTER — Encounter: Payer: Self-pay | Admitting: Pain Medicine

## 2017-06-05 ENCOUNTER — Ambulatory Visit: Payer: Medicare HMO | Attending: Pain Medicine | Admitting: Pain Medicine

## 2017-06-05 VITALS — BP 150/88 | HR 80 | Temp 98.0°F | Ht 64.0 in | Wt 98.0 lb

## 2017-06-05 DIAGNOSIS — E785 Hyperlipidemia, unspecified: Secondary | ICD-10-CM | POA: Insufficient documentation

## 2017-06-05 DIAGNOSIS — M9983 Other biomechanical lesions of lumbar region: Secondary | ICD-10-CM

## 2017-06-05 DIAGNOSIS — Z79899 Other long term (current) drug therapy: Secondary | ICD-10-CM | POA: Diagnosis not present

## 2017-06-05 DIAGNOSIS — F419 Anxiety disorder, unspecified: Secondary | ICD-10-CM | POA: Diagnosis not present

## 2017-06-05 DIAGNOSIS — M4692 Unspecified inflammatory spondylopathy, cervical region: Secondary | ICD-10-CM | POA: Diagnosis not present

## 2017-06-05 DIAGNOSIS — M533 Sacrococcygeal disorders, not elsewhere classified: Secondary | ICD-10-CM | POA: Insufficient documentation

## 2017-06-05 DIAGNOSIS — M25511 Pain in right shoulder: Secondary | ICD-10-CM | POA: Diagnosis not present

## 2017-06-05 DIAGNOSIS — M4316 Spondylolisthesis, lumbar region: Secondary | ICD-10-CM | POA: Diagnosis not present

## 2017-06-05 DIAGNOSIS — M545 Low back pain: Secondary | ICD-10-CM

## 2017-06-05 DIAGNOSIS — X58XXXD Exposure to other specified factors, subsequent encounter: Secondary | ICD-10-CM | POA: Insufficient documentation

## 2017-06-05 DIAGNOSIS — M542 Cervicalgia: Secondary | ICD-10-CM | POA: Diagnosis not present

## 2017-06-05 DIAGNOSIS — E039 Hypothyroidism, unspecified: Secondary | ICD-10-CM | POA: Insufficient documentation

## 2017-06-05 DIAGNOSIS — M47816 Spondylosis without myelopathy or radiculopathy, lumbar region: Secondary | ICD-10-CM | POA: Insufficient documentation

## 2017-06-05 DIAGNOSIS — J449 Chronic obstructive pulmonary disease, unspecified: Secondary | ICD-10-CM | POA: Insufficient documentation

## 2017-06-05 DIAGNOSIS — F1990 Other psychoactive substance use, unspecified, uncomplicated: Secondary | ICD-10-CM | POA: Diagnosis not present

## 2017-06-05 DIAGNOSIS — Z981 Arthrodesis status: Secondary | ICD-10-CM | POA: Insufficient documentation

## 2017-06-05 DIAGNOSIS — M47812 Spondylosis without myelopathy or radiculopathy, cervical region: Secondary | ICD-10-CM | POA: Insufficient documentation

## 2017-06-05 DIAGNOSIS — E0590 Thyrotoxicosis, unspecified without thyrotoxic crisis or storm: Secondary | ICD-10-CM | POA: Diagnosis not present

## 2017-06-05 DIAGNOSIS — I2510 Atherosclerotic heart disease of native coronary artery without angina pectoris: Secondary | ICD-10-CM | POA: Insufficient documentation

## 2017-06-05 DIAGNOSIS — M4696 Unspecified inflammatory spondylopathy, lumbar region: Secondary | ICD-10-CM | POA: Diagnosis not present

## 2017-06-05 DIAGNOSIS — S72141D Displaced intertrochanteric fracture of right femur, subsequent encounter for closed fracture with routine healing: Secondary | ICD-10-CM | POA: Diagnosis not present

## 2017-06-05 DIAGNOSIS — K219 Gastro-esophageal reflux disease without esophagitis: Secondary | ICD-10-CM | POA: Insufficient documentation

## 2017-06-05 DIAGNOSIS — Z789 Other specified health status: Secondary | ICD-10-CM | POA: Diagnosis not present

## 2017-06-05 DIAGNOSIS — E559 Vitamin D deficiency, unspecified: Secondary | ICD-10-CM | POA: Insufficient documentation

## 2017-06-05 DIAGNOSIS — M4310 Spondylolisthesis, site unspecified: Secondary | ICD-10-CM

## 2017-06-05 DIAGNOSIS — M48061 Spinal stenosis, lumbar region without neurogenic claudication: Secondary | ICD-10-CM

## 2017-06-05 DIAGNOSIS — M160 Bilateral primary osteoarthritis of hip: Secondary | ICD-10-CM | POA: Diagnosis not present

## 2017-06-05 DIAGNOSIS — G5603 Carpal tunnel syndrome, bilateral upper limbs: Secondary | ICD-10-CM | POA: Insufficient documentation

## 2017-06-05 DIAGNOSIS — M48062 Spinal stenosis, lumbar region with neurogenic claudication: Secondary | ICD-10-CM | POA: Diagnosis not present

## 2017-06-05 DIAGNOSIS — M47896 Other spondylosis, lumbar region: Secondary | ICD-10-CM

## 2017-06-05 DIAGNOSIS — M899 Disorder of bone, unspecified: Secondary | ICD-10-CM | POA: Diagnosis not present

## 2017-06-05 DIAGNOSIS — F329 Major depressive disorder, single episode, unspecified: Secondary | ICD-10-CM | POA: Insufficient documentation

## 2017-06-05 DIAGNOSIS — G8929 Other chronic pain: Secondary | ICD-10-CM

## 2017-06-05 DIAGNOSIS — G894 Chronic pain syndrome: Secondary | ICD-10-CM

## 2017-06-05 DIAGNOSIS — Z79891 Long term (current) use of opiate analgesic: Secondary | ICD-10-CM | POA: Insufficient documentation

## 2017-06-05 DIAGNOSIS — F1190 Opioid use, unspecified, uncomplicated: Secondary | ICD-10-CM

## 2017-06-05 DIAGNOSIS — M791 Myalgia: Secondary | ICD-10-CM | POA: Diagnosis not present

## 2017-06-05 DIAGNOSIS — E782 Mixed hyperlipidemia: Secondary | ICD-10-CM | POA: Diagnosis not present

## 2017-06-05 DIAGNOSIS — E538 Deficiency of other specified B group vitamins: Secondary | ICD-10-CM | POA: Diagnosis not present

## 2017-06-05 DIAGNOSIS — R69 Illness, unspecified: Secondary | ICD-10-CM | POA: Diagnosis not present

## 2017-06-05 DIAGNOSIS — K224 Dyskinesia of esophagus: Secondary | ICD-10-CM | POA: Insufficient documentation

## 2017-06-05 DIAGNOSIS — F17210 Nicotine dependence, cigarettes, uncomplicated: Secondary | ICD-10-CM | POA: Insufficient documentation

## 2017-06-05 MED ORDER — KETOROLAC TROMETHAMINE 60 MG/2ML IM SOLN
INTRAMUSCULAR | Status: AC
  Filled 2017-06-05: qty 2

## 2017-06-05 MED ORDER — ORPHENADRINE CITRATE 30 MG/ML IJ SOLN
60.0000 mg | Freq: Once | INTRAMUSCULAR | Status: AC
  Administered 2017-06-05: 60 mg via INTRAMUSCULAR

## 2017-06-05 MED ORDER — ORPHENADRINE CITRATE 30 MG/ML IJ SOLN
INTRAMUSCULAR | Status: AC
  Filled 2017-06-05: qty 2

## 2017-06-05 MED ORDER — KETOROLAC TROMETHAMINE 60 MG/2ML IM SOLN
60.0000 mg | Freq: Once | INTRAMUSCULAR | Status: AC
  Administered 2017-06-05: 60 mg via INTRAMUSCULAR

## 2017-06-05 MED ORDER — OXYCODONE HCL 5 MG PO TABS: 5.0000 mg | ORAL_TABLET | Freq: Every day | ORAL | 0 refills | Status: DC

## 2017-06-05 MED ORDER — OXYCODONE HCL 5 MG PO TABS: 5.0000 mg | ORAL_TABLET | Freq: Three times a day (TID) | ORAL | 0 refills | Status: DC

## 2017-06-05 MED ORDER — OXYCODONE HCL 5 MG PO TABS: 5.0000 mg | ORAL_TABLET | Freq: Two times a day (BID) | ORAL | 0 refills | Status: DC

## 2017-06-05 MED ORDER — OXYCODONE HCL 5 MG PO TABS: 5.0000 mg | ORAL_TABLET | Freq: Four times a day (QID) | ORAL | 0 refills | Status: DC

## 2017-06-05 NOTE — Telephone Encounter (Signed)
Spoke with Threasa Beards from [place] and she states that the patient has a Morphine 15 mg prescription on hold from [facility].  Dr Dossie Arbour notified and he asked that the prescription be mailed back to [place].  Threasa Beards states she would discontinue the script and mail it back to [facility].

## 2017-06-05 NOTE — Patient Instructions (Addendum)
__You have been given 5 prescriptions for oxycodone today.  They are numbered in the porder in which they should be filled.  You have been scheduled for xrays, labs and an MRI.  You have been ordered a facet block with sedation.  Do not eat or drink for 8 hours prior to procedure, bring a driver.___Your previous prescription for Morphine was discontinued. ______________________________________________________________________________________  Preparing for Procedure with Sedation Instructions: . Oral Intake: Do not eat or drink anything for at least 8 hours prior to your procedure. . Transportation: Public transportation is not allowed. Bring an adult driver. The driver must be physically present in our waiting room before any procedure can be started. Marland Kitchen Physical Assistance: Bring an adult physically capable of assisting you, in the event you need help. This adult should keep you company at home for at least 6 hours after the procedure. . Blood Pressure Medicine: Take your blood pressure medicine with a sip of water the morning of the procedure. . Blood thinners:  . Diabetics on insulin: Notify the staff so that you can be scheduled 1st case in the morning. If your diabetes requires high dose insulin, take only  of your normal insulin dose the morning of the procedure and notify the staff that you have done so. . Preventing infections: Shower with an antibacterial soap the morning of your procedure. . Build-up your immune system: Take 1000 mg of Vitamin C with every meal (3 times a day) the day prior to your procedure. Marland Kitchen Antibiotics: Inform the staff if you have a condition or reason that requires you to take antibiotics before dental procedures. . Pregnancy: If you are pregnant, call and cancel the procedure. . Sickness: If you have a cold, fever, or any active infections, call and cancel the procedure. . Arrival: You must be in the facility at least 30 minutes prior to your scheduled  procedure. . Children: Do not bring children with you. . Dress appropriately: Bring dark clothing that you would not mind if they get stained. . Valuables: Do not bring any jewelry or valuables. Procedure appointments are reserved for interventional treatments only. Marland Kitchen No Prescription Refills. . No medication changes will be discussed during procedure appointments. . No disability issues will be discussed. ____________________________________________________________________________________________  Preparing for Procedure with Sedation Instructions: . Oral Intake: Do not eat or drink anything for at least 8 hours prior to your procedure. . Transportation: Public transportation is not allowed. Bring an adult driver. The driver must be physically present in our waiting room before any procedure can be started. Marland Kitchen Physical Assistance: Bring an adult capable of physically assisting you, in the event you need help. . Blood Pressure Medicine: Take your blood pressure medicine with a sip of water the morning of the procedure. . Insulin: Take only  of your normal insulin dose. . Preventing infections: Shower with an antibacterial soap the morning of your procedure. . Build-up your immune system: Take 1000 mg of Vitamin C with every meal (3 times a day) the day prior to your procedure. . Pregnancy: If you are pregnant, call and cancel the procedure. . Sickness: If you have a cold, fever, or any active infections, call and cancel the procedure. . Arrival: You must be in the facility at least 30 minutes prior to your scheduled procedure. . Children: Do not bring children with you. . Dress appropriately: Bring dark clothing that you would not mind if they get stained. . Valuables: Do not bring any jewelry or valuables. Procedure appointments  are reserved for interventional treatments only. Marland Kitchen No Prescription Refills. . No medication changes will be discussed during procedure appointments. No disability  issues will be discussed.Facet Blocks Patient Information  Description: The facets are joints in the spine between the vertebrae.  Like any joints in the body, facets can become irritated and painful.  Arthritis can also effect the facets.  By injecting steroids and local anesthetic in and around these joints, we can temporarily block the nerve supply to them.  Steroids act directly on irritated nerves and tissues to reduce selling and inflammation which often leads to decreased pain.  Facet blocks may be done anywhere along the spine from the neck to the low back depending upon the location of your pain.   After numbing the skin with local anesthetic (like Novocaine), a small needle is passed onto the facet joints under x-ray guidance.  You may experience a sensation of pressure while this is being done.  The entire block usually lasts about 15-25 minutes.   Conditions which may be treated by facet blocks:  Low back/buttock pain Neck/shoulder pain Certain types of headaches  Preparation for the injection:  Do not eat any solid food or dairy products within 8 hours of your appointment. You may drink clear liquid up to 3 hours before appointment.  Clear liquids include water, black coffee, juice or soda.  No milk or cream please. You may take your regular medication, including pain medications, with a sip of water before your appointment.  Diabetics should hold regular insulin (if taken separately) and take 1/2 normal NPH dose the morning of the procedure.  Carry some sugar containing items with you to your appointment. A driver must accompany you and be prepared to drive you home after your procedure. Bring all your current medications with you. An IV may be inserted and sedation may be given at the discretion of the physician. A blood pressure cuff, EKG and other monitors will often be applied during the procedure.  Some patients may need to have extra oxygen administered for a short  period. You will be asked to provide medical information, including your allergies and medications, prior to the procedure.  We must know immediately if you are taking blood thinners (like Coumadin/Warfarin) or if you are allergic to IV iodine contrast (dye).  We must know if you could possible be pregnant.  Possible side-effects:  Bleeding from needle site Infection (rare, may require surgery) Nerve injury (rare) Numbness & tingling (temporary) Difficulty urinating (rare, temporary) Spinal headache (a headache worse with upright posture) Light-headedness (temporary) Pain at injection site (serveral days) Decreased blood pressure (rare, temporary) Weakness in arm/leg (temporary) Pressure sensation in back/neck (temporary)   Call if you experience:  Fever/chills associated with headache or increased back/neck pain Headache worsened by an upright position New onset, weakness or numbness of an extremity below the injection site Hives or difficulty breathing (go to the emergency room) Inflammation or drainage at the injection site(s) Severe back/neck pain greater than usual New symptoms which are concerning to you  Please note:  Although the local anesthetic injected can often make your back or neck feel good for several hours after the injection, the pain will likely return. It takes 3-7 days for steroids to work.  You may not notice any pain relief for at least one week.  If effective, we will often do a series of 2-3 injections spaced 3-6 weeks apart to maximally decrease your pain.  After the initial series, you may  be a candidate for a more permanent nerve block of the facets.  If you have any questions, please call #[phone] . [facility]

## 2017-06-05 NOTE — Progress Notes (Signed)
Nursing Pain Medication Assessment:  Safety precautions to be maintained throughout the outpatient stay will include: orient to surroundings, keep bed in low position, maintain call bell within reach at all times, provide assistance with transfer out of bed and ambulation.  Medication Inspection Compliance: Pill count conducted under aseptic conditions, in front of the patient. Neither the pills nor the bottle was removed from the patient's sight at any time. Once count was completed pills were immediately returned to the patient in their original bottle.  Medication: See above Pill/Patch Count: 0 of 60 pills remain Pill/Patch Appearance: Markings consistent with prescribed medication Bottle Appearance: Standard pharmacy container. Clearly labeled. Filled Date: 6 / 30 / 2018 Last Medication intake:  Today

## 2017-06-06 ENCOUNTER — Telehealth: Payer: Self-pay | Admitting: Pain Medicine

## 2017-06-06 NOTE — Telephone Encounter (Signed)
Please call.

## 2017-06-06 NOTE — Telephone Encounter (Signed)
Patient is concerned about the tone of her appt. On 06-05-17. She feels that Dr. Dossie Arbour thought she was going to take both medicines and that was not her intention. Patient would like to speak with Dionisio David about this.

## 2017-06-11 LAB — COMPREHENSIVE METABOLIC PANEL
A/G RATIO: 1.3 (ref 1.2–2.2)
ALBUMIN: 3.6 g/dL (ref 3.5–4.8)
ALT: 14 IU/L (ref 0–32)
AST: 17 IU/L (ref 0–40)
Alkaline Phosphatase: 124 IU/L — ABNORMAL HIGH (ref 39–117)
BUN/Creatinine Ratio: 21 (ref 12–28)
BUN: 14 mg/dL (ref 8–27)
Bilirubin Total: <0.2 mg/dL (ref 0.0–1.2)
CALCIUM: 9 mg/dL (ref 8.7–10.3)
CO2: 23 mmol/L (ref 20–29)
Chloride: 104 mmol/L (ref 96–106)
Creatinine, Ser: 0.68 mg/dL (ref 0.57–1.00)
GFR calc Af Amer: 102 mL/min/{1.73_m2} (ref >59)
GFR, EST NON AFRICAN AMERICAN: 89 mL/min/{1.73_m2} (ref >59)
Globulin, Total: 2.7 g/dL (ref 1.5–4.5)
Glucose: 71 mg/dL (ref 65–99)
POTASSIUM: 4 mmol/L (ref 3.5–5.2)
Sodium: 144 mmol/L (ref 134–144)
Total Protein: 6.3 g/dL (ref 6.0–8.5)

## 2017-06-11 LAB — 25-HYDROXYVITAMIN D LCMS D2+D3: 25-HYDROXY, VITAMIN D-3: 25 ng/mL

## 2017-06-11 LAB — MAGNESIUM: MAGNESIUM: 1.7 mg/dL (ref 1.6–2.3)

## 2017-06-11 LAB — 25-HYDROXY VITAMIN D LCMS D2+D3
25-Hydroxy, Vitamin D-2: 3 ng/mL
25-Hydroxy, Vitamin D: 28 ng/mL — ABNORMAL LOW

## 2017-06-11 LAB — SEDIMENTATION RATE: SED RATE: 9 mm/h (ref 0–40)

## 2017-06-11 LAB — C-REACTIVE PROTEIN: CRP: 14.3 mg/L — AB (ref 0.0–4.9)

## 2017-06-11 LAB — VITAMIN B12: Vitamin B-12: 282 pg/mL (ref 232–1245)

## 2017-06-12 LAB — TOXASSURE SELECT 13 (MW), URINE

## 2017-06-14 DIAGNOSIS — H2511 Age-related nuclear cataract, right eye: Secondary | ICD-10-CM | POA: Diagnosis not present

## 2017-06-18 ENCOUNTER — Ambulatory Visit
Admission: RE | Admit: 2017-06-18 | Discharge: 2017-06-18 | Disposition: A | Discharge status: Home / Self Care | Payer: Medicare HMO | Source: Ambulatory Visit | Attending: Pain Medicine | Admitting: Pain Medicine

## 2017-06-18 DIAGNOSIS — I700 Atherosclerosis of aorta: Secondary | ICD-10-CM | POA: Insufficient documentation

## 2017-06-18 DIAGNOSIS — G8929 Other chronic pain: Secondary | ICD-10-CM | POA: Diagnosis not present

## 2017-06-18 DIAGNOSIS — R29890 Loss of height: Secondary | ICD-10-CM | POA: Diagnosis not present

## 2017-06-18 DIAGNOSIS — M438X5 Other specified deforming dorsopathies, thoracolumbar region: Secondary | ICD-10-CM | POA: Insufficient documentation

## 2017-06-18 DIAGNOSIS — M533 Sacrococcygeal disorders, not elsewhere classified: Secondary | ICD-10-CM

## 2017-06-18 DIAGNOSIS — M47816 Spondylosis without myelopathy or radiculopathy, lumbar region: Secondary | ICD-10-CM | POA: Insufficient documentation

## 2017-06-18 DIAGNOSIS — M48062 Spinal stenosis, lumbar region with neurogenic claudication: Secondary | ICD-10-CM

## 2017-06-18 DIAGNOSIS — M9983 Other biomechanical lesions of lumbar region: Secondary | ICD-10-CM | POA: Diagnosis not present

## 2017-06-18 DIAGNOSIS — M48061 Spinal stenosis, lumbar region without neurogenic claudication: Secondary | ICD-10-CM

## 2017-06-18 DIAGNOSIS — M4310 Spondylolisthesis, site unspecified: Secondary | ICD-10-CM

## 2017-06-18 DIAGNOSIS — M47812 Spondylosis without myelopathy or radiculopathy, cervical region: Secondary | ICD-10-CM

## 2017-06-18 DIAGNOSIS — M4856XA Collapsed vertebra, not elsewhere classified, lumbar region, initial encounter for fracture: Secondary | ICD-10-CM | POA: Insufficient documentation

## 2017-06-18 DIAGNOSIS — M4316 Spondylolisthesis, lumbar region: Secondary | ICD-10-CM | POA: Diagnosis not present

## 2017-06-18 DIAGNOSIS — M545 Low back pain: Secondary | ICD-10-CM | POA: Diagnosis not present

## 2017-06-18 DIAGNOSIS — M4696 Unspecified inflammatory spondylopathy, lumbar region: Secondary | ICD-10-CM | POA: Insufficient documentation

## 2017-06-18 DIAGNOSIS — M5136 Other intervertebral disc degeneration, lumbar region: Secondary | ICD-10-CM | POA: Insufficient documentation

## 2017-06-18 DIAGNOSIS — M4854XA Collapsed vertebra, not elsewhere classified, thoracic region, initial encounter for fracture: Secondary | ICD-10-CM | POA: Diagnosis not present

## 2017-06-18 DIAGNOSIS — S32050A Wedge compression fracture of fifth lumbar vertebra, initial encounter for closed fracture: Secondary | ICD-10-CM | POA: Diagnosis not present

## 2017-06-18 DIAGNOSIS — M4692 Unspecified inflammatory spondylopathy, cervical region: Secondary | ICD-10-CM | POA: Diagnosis not present

## 2017-06-18 DIAGNOSIS — M47896 Other spondylosis, lumbar region: Secondary | ICD-10-CM | POA: Insufficient documentation

## 2017-06-18 MED ORDER — GADOBENATE DIMEGLUMINE 529 MG/ML IV SOLN
8.0000 mL | Freq: Once | INTRAVENOUS | Status: AC | PRN
  Administered 2017-06-18: 8 mL via INTRAVENOUS

## 2017-06-20 ENCOUNTER — Telehealth: Payer: Self-pay | Admitting: Pain Medicine

## 2017-06-20 NOTE — Telephone Encounter (Signed)
Results read to patient. 

## 2017-06-20 NOTE — Telephone Encounter (Signed)
Would like Radiology Results

## 2017-06-25 ENCOUNTER — Ambulatory Visit: Payer: Medicare HMO | Attending: Pain Medicine | Admitting: Pain Medicine

## 2017-06-25 ENCOUNTER — Encounter: Payer: Self-pay | Admitting: Pain Medicine

## 2017-06-25 VITALS — BP 165/85 | HR 103 | Temp 98.6°F | Resp 16 | Ht 61.0 in | Wt 98.0 lb

## 2017-06-25 DIAGNOSIS — Z820 Family history of epilepsy and other diseases of the nervous system: Secondary | ICD-10-CM | POA: Insufficient documentation

## 2017-06-25 DIAGNOSIS — Z8711 Personal history of peptic ulcer disease: Secondary | ICD-10-CM | POA: Insufficient documentation

## 2017-06-25 DIAGNOSIS — R69 Illness, unspecified: Secondary | ICD-10-CM | POA: Diagnosis not present

## 2017-06-25 DIAGNOSIS — Z9889 Other specified postprocedural states: Secondary | ICD-10-CM | POA: Insufficient documentation

## 2017-06-25 DIAGNOSIS — F329 Major depressive disorder, single episode, unspecified: Secondary | ICD-10-CM | POA: Diagnosis not present

## 2017-06-25 DIAGNOSIS — K219 Gastro-esophageal reflux disease without esophagitis: Secondary | ICD-10-CM | POA: Insufficient documentation

## 2017-06-25 DIAGNOSIS — I2510 Atherosclerotic heart disease of native coronary artery without angina pectoris: Secondary | ICD-10-CM | POA: Insufficient documentation

## 2017-06-25 DIAGNOSIS — E538 Deficiency of other specified B group vitamins: Secondary | ICD-10-CM | POA: Insufficient documentation

## 2017-06-25 DIAGNOSIS — E785 Hyperlipidemia, unspecified: Secondary | ICD-10-CM | POA: Insufficient documentation

## 2017-06-25 DIAGNOSIS — Z79891 Long term (current) use of opiate analgesic: Secondary | ICD-10-CM | POA: Insufficient documentation

## 2017-06-25 DIAGNOSIS — M47816 Spondylosis without myelopathy or radiculopathy, lumbar region: Secondary | ICD-10-CM | POA: Diagnosis not present

## 2017-06-25 DIAGNOSIS — M533 Sacrococcygeal disorders, not elsewhere classified: Secondary | ICD-10-CM | POA: Diagnosis not present

## 2017-06-25 DIAGNOSIS — Z9049 Acquired absence of other specified parts of digestive tract: Secondary | ICD-10-CM | POA: Diagnosis not present

## 2017-06-25 DIAGNOSIS — M47896 Other spondylosis, lumbar region: Secondary | ICD-10-CM | POA: Insufficient documentation

## 2017-06-25 DIAGNOSIS — G8929 Other chronic pain: Secondary | ICD-10-CM | POA: Diagnosis not present

## 2017-06-25 DIAGNOSIS — Z87828 Personal history of other (healed) physical injury and trauma: Secondary | ICD-10-CM | POA: Diagnosis not present

## 2017-06-25 DIAGNOSIS — F17210 Nicotine dependence, cigarettes, uncomplicated: Secondary | ICD-10-CM | POA: Insufficient documentation

## 2017-06-25 DIAGNOSIS — E039 Hypothyroidism, unspecified: Secondary | ICD-10-CM | POA: Diagnosis not present

## 2017-06-25 DIAGNOSIS — M4696 Unspecified inflammatory spondylopathy, lumbar region: Secondary | ICD-10-CM | POA: Diagnosis not present

## 2017-06-25 DIAGNOSIS — G5603 Carpal tunnel syndrome, bilateral upper limbs: Secondary | ICD-10-CM | POA: Insufficient documentation

## 2017-06-25 DIAGNOSIS — J449 Chronic obstructive pulmonary disease, unspecified: Secondary | ICD-10-CM | POA: Diagnosis not present

## 2017-06-25 DIAGNOSIS — M545 Low back pain: Secondary | ICD-10-CM | POA: Diagnosis not present

## 2017-06-25 DIAGNOSIS — K224 Dyskinesia of esophagus: Secondary | ICD-10-CM | POA: Insufficient documentation

## 2017-06-25 DIAGNOSIS — E559 Vitamin D deficiency, unspecified: Secondary | ICD-10-CM | POA: Insufficient documentation

## 2017-06-25 DIAGNOSIS — F419 Anxiety disorder, unspecified: Secondary | ICD-10-CM | POA: Insufficient documentation

## 2017-06-25 NOTE — Patient Instructions (Signed)

## 2017-06-25 NOTE — Progress Notes (Deleted)
Test #1: This is Just a Test Note. Delete if found in a patient's medical recods. Test: Match Template Formatting.   Orders SmartLinks  LAB ORDERS: (Labs ordered during the encounter. I included Microbiology here as well) LABSENC = LABORDTHISENC Lab Orders  No laboratory test(s) ordered today    IMAGING ORDERS: (Imaging tests ordered during the encounter) [facility] = IMGORDTHISENC Imaging Orders  No imaging studies ordered today    PROCEDURE ORDERS: (Procedures ordered during this encounter) [place] = [place]  Procedure Orders     LUMBAR FACET(MEDIAL BRANCH NERVE BLOCK) [place]: (Referrals ordered during this encounter ) REFENC = REFORDTHISENC Referral Orders  No referral(s) requested today      Lab Orders  No laboratory test(s) ordered today   Imaging Orders  No imaging studies ordered today    Procedure Orders     LUMBAR FACET(MEDIAL BRANCH NERVE BLOCK) [place] Referral Orders  No referral(s) requested today    Opioid risk tool (ORT) (Total Score):       Opioid Risk Tool - 06/05/17 1124      Family History of Substance Abuse   Alcohol Negative   Illegal Drugs Negative   Rx Drugs Negative     Personal History of Substance Abuse   Alcohol Negative   Illegal Drugs Negative   Rx Drugs Negative     Age   Age between 68-45 years  No     History of Preadolescent Sexual Abuse   History of Preadolescent Sexual Abuse Negative or Female     Psychological Disease   Psychological Disease Positive   ADD Negative   OCD Negative   Bipolar Negative   Schizophrenia Negative   Depression Positive  aniety     Total Score   Opioid Risk Tool Scoring 3   Opioid Risk Interpretation Low Risk     ORT Scoring interpretation table:  Score <3 = Low Risk for SUD  Score between 4-7 = Moderate Risk for SUD  Score >8 = High Risk for Opioid Abuse

## 2017-06-25 NOTE — Progress Notes (Signed)
Nursing Pain Medication Assessment:  Safety precautions to be maintained throughout the outpatient stay will include: orient to surroundings, keep bed in low position, maintain call bell within reach at all times, provide assistance with transfer out of bed and ambulation.  Medication Inspection Compliance: Pill count conducted under aseptic conditions, in front of the patient. Neither the pills nor the bottle was removed from the patient's sight at any time. Once count was completed pills were immediately returned to the patient in their original bottle.  Medication #1: Oxycodone IR Pill/Patch Count: 3 of 21 pills remain Pill/Patch Appearance: Markings consistent with prescribed medication Bottle Appearance: Standard pharmacy container. Clearly labeled. Filled Date: 07 / 31 / 2018 Last Medication intake:  Today  Medication #2: Morphine IR Pill/Patch Count: 0 of 60 pills remain Pill/Patch Appearance: Markings consistent with prescribed medication Bottle Appearance: Standard pharmacy container. Clearly labeled. Filled Date: 06 / 12 / 2018 Last Medication intake:  Ran out of medicine more than 48 hours ago

## 2017-06-25 NOTE — Progress Notes (Signed)
Patient's Name: [name]  MRN: [id number]  Referring Provider: Albina Billet, MD  DOB: 1946-09-08  PCP: Albina Billet, MD  DOS: 06/25/2017  Note by: Gaspar Cola, MD  Service setting: Ambulatory outpatient  Specialty: Interventional Pain Management  Location: [facility] (AMB) Pain Management Facility    Patient type: Established   Primary Reason(s) for Visit: Encounter for prescription drug management. (Level of risk: moderate)  CC: Back Pain (low)  HPI  [name] is a 71 y.o. year old, female patient, who comes today for a medication management evaluation. 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); Cervical facet syndrome (Location of Secondary source of pain) (Bilateral) (R>L); Cervical myofascial pain syndrome; Chronic low back pain (Location of Primary Source of Pain) (Bilateral) (R>L); Lumbar spondylosis; Chronic shoulder impingement syndrome (Right); Low magnesium levels; CRP elevated; Elevated sedimentation rate; Chronic obstructive pulmonary disease (COPD) ([place]); Nicotine dependence; Chronic shoulder pain (Right); Abnormal nerve conduction studies; Chronic shoulder pain (Bilateral) (status post multiple surgeries) (R>L); Cervical facet hypertrophy (Bilateral); History of shoulder surgery 5 (Right); Lumbar foraminal stenosis (L3-4) (Left); 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 Her primarily concern today is the Back Pain (low)  Pain Assessment: Location: Lower Back Radiating: radioates to right buttock and around to groin Onset: More than a month ago Duration: Chronic pain Quality: Burning, Sharp, Radiating, Discomfort Severity: 6 /10 (self-reported pain score)  Note: Reported level is inconsistent with clinical observations. Clinically the patient looks like a 2/10 Information on the proper use of the pain scale provided to the patient today Timing: Constant  [name] was last scheduled for an appointment on 06/20/2017 for medication management. During today's appointment we reviewed [name] chronic pain status, as well as her outpatient medication regimen. The patient comes in today rather anxious because she has been scheduled to return on 07/10/2017 for her nerve blocks. She is concerned that she will be out of medication and with considerable amount of pain before we can start her back on her medicines. They we made arrangements so that she can come in this week to have her injections done so as to control her pain better, so that she can go ahead and do her "Drug Holiday".  The patient  reports that she does not use drugs. Her body mass index is 18.52 kg/m.  Further details on both, my assessment(s), as well as the proposed treatment plan, please see below.  Controlled Substance Pharmacotherapy Assessment REMS (Risk Evaluation and Mitigation Strategy)  Analgesic: Oxycodone IR 5 mg 1 tablet by mouth 3 times a day (15 mg/day of oxycodone) (22.5 MME/Day) MME/day: 22.5 mg/day.  Dewayne Shorter, RN  06/25/2017  2:38 PM  Signed Nursing Pain Medication Assessment:   Safety precautions to be maintained throughout the outpatient stay will  include: orient to surroundings, keep bed in low position, maintain call bell within reach at all times, provide assistance with transfer out of bed and ambulation.  Medication Inspection Compliance: Pill count conducted under aseptic conditions, in front of the patient. Neither the pills nor the bottle was removed from the patient's sight at any time. Once count was completed pills were immediately returned to the patient in their original bottle.  Medication #1: Oxycodone IR Pill/Patch Count: 3 of 21 pills remain Pill/Patch Appearance: Markings consistent with prescribed medication Bottle Appearance: Standard pharmacy container. Clearly labeled. Filled Date: 07 / 31 / 2018 Last Medication intake:  Today  Medication #2: Morphine IR Pill/Patch Count: 0 of 60 pills remain Pill/Patch Appearance: Markings consistent with prescribed medication Bottle Appearance: Standard pharmacy container. Clearly labeled. Filled Date: 06 / 12 / 2018 Last Medication intake:  Ran out of medicine more than 48 hours ago   Pharmacokinetics: Liberation and absorption (onset of action): WNL Distribution (time to peak effect): WNL Metabolism and excretion (duration of action): WNL         Pharmacodynamics: Desired effects: Analgesia: [name] reports >50% benefit. Functional ability: Patient reports that medication allows her to accomplish basic ADLs Clinically meaningful improvement in function (CMIF): Sustained CMIF goals met Perceived effectiveness: Described as relatively effective, allowing for increase in activities of daily living (ADL) Undesirable effects: Side-effects or Adverse reactions: None reported Monitoring: [place] PMP: Online review of the past 59-monthperiod conducted. Compliant with practice rules and regulations List of all UDS test(s) done:  Lab Results  Component Value Date   TOXASSSELUR FINAL 11/28/2016    TBlissFINAL 05/18/2016   THamiltonFINAL 02/28/2016   TOld RipleyFINAL 12/08/2015   SUMMARY FINAL 06/05/2017   Last UDS on record: ToxAssure Select 13  Date Value Ref Range Status  11/28/2016 FINAL  Final    Comment:    ==================================================================== TOXASSURE SELECT 13 (MW) ==================================================================== Test                             Result       Flag       Units Drug Present and Declared for Prescription Verification   Alprazolam                     173          EXPECTED   ng/mg creat   Alpha-hydroxyalprazolam        >2058        EXPECTED   ng/mg creat    Source of alprazolam is a scheduled prescription medication.    Alpha-hydroxyalprazolam is an expected metabolite of alprazolam.   Morphine                       1221         EXPECTED   ng/mg creat    Potential sources of large amounts of morphine in the absence of    codeine include administration of morphine or use of heroin.   Oxymorphone                    126          EXPECTED   ng/mg creat   Noroxycodone                   279          EXPECTED   ng/mg creat  Noroxymorphone                 52           EXPECTED   ng/mg creat    Oxymorphone, noroxycodone and noroxymorphone are expected    metabolites of oxycodone. Noroxymorphone is an expected    metabolite of oxymorphone. Sources of oxycodone and/or    oxymorphone include scheduled prescription medications. Drug Present not Declared for Prescription Verification   Hydromorphone                  112          UNEXPECTED ng/mg creat    Hydromorphone may be administered as a scheduled prescription    medication and is also an expected metabolite of hydrocodone.    Hydromorphone is also a minor metabolite of morphine which is    also present in this specimen. Concentrations of hydromorphone    rarely exceed 5% of the morphine concentration when metabolism of    morphine is the sole source  of hydromorphone. Drug Absent but Declared for Prescription Verification   Oxycodone                      Not Detected UNEXPECTED ng/mg creat    Oxycodone is almost always present in patients taking this drug    consistently.  Absence of oxycodone could be due to lapse of time    since the last dose or unusual pharmacokinetics (rapid    metabolism). ==================================================================== Test                      Result    Flag   Units      Ref Range   Creatinine              243              mg/dL      >=20 ==================================================================== Declared Medications:  The flagging and interpretation on this report are based on the  following declared medications.  Unexpected results may arise from  inaccuracies in the declared medications.  **Note: The testing scope of this panel includes these medications:  Alprazolam  Morphine (Morphine Sulfate)  Oxycodone  **Note: The testing scope of this panel does not include following  reported medications:  Albuterol (Ipratropium-Albuterol)  Atorvastatin  Bupropion  Cholecalciferol  Docusate (Senokot-S)  Escitalopram  Fluticasone  Ipratropium (Ipratropium-Albuterol)  Levothyroxine  Magnesium Oxide  Pantoprazole  Salmeterol  Sennosides (Senokot-S)  Tizanidine  Trazodone ==================================================================== For clinical consultation, please call [phone]. ====================================================================    Summary  Date Value Ref Range Status  06/05/2017 FINAL  Final    Comment:    ==================================================================== TOXASSURE SELECT 13 (MW) ==================================================================== Test                             Result       Flag       Units Drug Present and Declared for Prescription Verification   Alprazolam                     328           EXPECTED   ng/mg creat   Alpha-hydroxyalprazolam        1837         EXPECTED   ng/mg creat    Source of alprazolam is a scheduled prescription medication.  Alpha-hydroxyalprazolam is an expected metabolite of alprazolam.   Morphine                       162          EXPECTED   ng/mg creat    Potential sources of morphine include administration of codeine    or morphine, use of heroin, or ingestion of poppy seeds. Drug Present not Declared for Prescription Verification   Hydrocodone                    2096         UNEXPECTED ng/mg creat   Hydromorphone                  637          UNEXPECTED ng/mg creat   Dihydrocodeine                 200          UNEXPECTED ng/mg creat   Norhydrocodone                 2725         UNEXPECTED ng/mg creat    Sources of hydrocodone include scheduled prescription    medications. Dihydrocodeine and norhydrocodone are expected    metabolites of hydrocodone. Dihydrocodeine is also available as a    scheduled prescription medication.    Hydromorphone may be administered as a scheduled prescription    medication and is also an expected metabolite of hydrocodone and    a minor metabolite of morphine. Hydrocodone and/or its    metabolites and morphine are also present in this specimen. Drug Absent but Declared for Prescription Verification   Oxycodone                      Not Detected UNEXPECTED ng/mg creat ==================================================================== Test                      Result    Flag   Units      Ref Range   Creatinine              81               mg/dL      >=20 ==================================================================== Declared Medications:  The flagging and interpretation on this report are based on the  following declared medications.  Unexpected results may arise from  inaccuracies in the declared medications.  **Note: The testing scope of this panel includes these medications:  Alprazolam  Morphine (Morphine  Sulfate)  Oxycodone  **Note: The testing scope of this panel does not include following  reported medications:  Albuterol (Combivent)  Bupropion  Cyanocobalamin  Docusate (Senokot-S)  Escitalopram  Fluticasone (Advair)  Ipratropium (Combivent)  Levothyroxine  Magnesium Oxide  Pantoprazole  Salmeterol (Advair)  Sennosides (Senokot-S)  Tizanidine  Vitamin D3 ==================================================================== For clinical consultation, please call [phone]. ====================================================================    UDS interpretation: Compliant          Medication Assessment Form: Reviewed. Patient indicates being compliant with therapy Treatment compliance: Compliant Risk Assessment Profile: Aberrant behavior: See prior evaluations. None observed or detected today Comorbid factors increasing risk of overdose: See prior notes. No additional risks detected today Risk of substance use disorder (SUD): Low Opioid Risk Tool (ORT) Total Score:  3     Opioid Risk Tool - 06/05/17 1124      Family  History of Substance Abuse   Alcohol Negative   Illegal Drugs Negative   Rx Drugs Negative     Personal History of Substance Abuse   Alcohol Negative   Illegal Drugs Negative   Rx Drugs Negative     Age   Age between 78-45 years  No     History of Preadolescent Sexual Abuse   History of Preadolescent Sexual Abuse Negative or Female     Psychological Disease   Psychological Disease Positive   ADD Negative   OCD Negative   Bipolar Negative   Schizophrenia Negative   Depression Positive  aniety     Total Score   Opioid Risk Tool Scoring 3   Opioid Risk Interpretation Low Risk     Interpretation Table:  Score <3 = Low Risk for SUD  Score between 4-7 = Moderate Risk for SUD  Score >8 = High Risk for Opioid Abuse   Risk Mitigation Strategies:  Patient Counseling: Covered Patient-Prescriber Agreement (PPA): Present and active   Notification to other healthcare providers: Done  Pharmacologic Plan: No change in therapy, at this time  Laboratory Chemistry  Inflammation Markers (CRP: Acute Phase) (ESR: Chronic Phase) Lab Results  Component Value Date   CRP 14.3 (H) 06/05/2017   ESRSEDRATE 9 06/05/2017                 Renal Function Markers Lab Results  Component Value Date   BUN 14 06/05/2017   CREATININE 0.68 06/05/2017   GFRAA 102 06/05/2017   GFRNONAA 89 06/05/2017                 Hepatic Function Markers Lab Results  Component Value Date   AST 17 06/05/2017   ALT 14 06/05/2017   ALBUMIN 3.6 06/05/2017   ALKPHOS 124 (H) 06/05/2017                 Electrolytes Lab Results  Component Value Date   NA 144 06/05/2017   K 4.0 06/05/2017   CL 104 06/05/2017   CALCIUM 9.0 06/05/2017   MG 1.7 06/05/2017                 Neuropathy Markers Lab Results  Component Value Date   VITAMINB12 282 06/05/2017                 Bone Pathology Markers Lab Results  Component Value Date   ALKPHOS 124 (H) 06/05/2017   25OHVITD1 28 (L) 06/05/2017   25OHVITD2 3.0 06/05/2017   25OHVITD3 25 06/05/2017   CALCIUM 9.0 06/05/2017                 Coagulation Parameters Lab Results  Component Value Date   INR 1.08 10/01/2016   LABPROT 14.0 10/01/2016   PLT 405 02/20/2017                 Cardiovascular Markers Lab Results  Component Value Date   HGB 11.5 (L) 02/20/2017   HCT 35.0 02/20/2017                 Note: Lab results reviewed.  Recent Diagnostic Imaging Review  Dg Lumbar Spine Complete W/bend Result Date: 06/18/2017 CLINICAL DATA:  71 year old female fractured hip in November with low back pain since. Initial encounter. EXAM: LUMBAR SPINE - COMPLETE WITH BENDING VIEWS COMPARISON:  02/10/2014. FINDINGS: New from the 2015 lumbar spine plain film examination are T11 and T12 superior endplate compression fractures with 25-30% loss height. There is also  a new L5 superior endplate compression  fracture with 20% loss of height. Please see MR lumbar spine report performed same date and dictated separately. Facet degenerative changes lumbar spine with minimal anterior slip L3. No abnormal motion between flexion and extension. No pars defect. Mild curvature lower thoracic lumbar spine convex right. Vascular calcifications. IMPRESSION: New from the 2015 lumbar spine plain film examination are T11 and T12 superior endplate compression fractures with 25-30% loss height. There is also a new L5 superior endplate compression fracture with 20% loss of height. Please see MR lumbar spine report performed same date and dictated separately. Facet degenerative changes lumbar spine with minimal anterior slip L3. No abnormal motion between flexion and extension. Mild curvature lower thoracic lumbar spine convex right. Aortic Atherosclerosis (ICD10-I70.0). Electronically Signed   By: Genia Del M.D.   On: 06/18/2017 20:04   Dg Si Joints Result Date: 06/18/2017 CLINICAL DATA:  Right groin and hip pain since November 2017. Fracture at that time. Low back pain since then. EXAM: BILATERAL SACROILIAC JOINTS - 3+ VIEW COMPARISON:  Right hip dated 02/20/2017. FINDINGS: Hardware fixation of the right hip is again demonstrated. The sacroiliac joints have normal appearances. Diffuse osteopenia is noted as well as lumbar spine degenerative changes. IMPRESSION: No acute abnormality.  Normal appearing sacroiliac joints. Electronically Signed   By: Claudie Revering M.D.   On: 06/18/2017 21:03   Mr Lumbar Spine W Wo Contrast Result Date: 06/18/2017 CLINICAL DATA:  Low back pain radiating into the right leg. EXAM: MRI LUMBAR SPINE WITHOUT AND WITH CONTRAST TECHNIQUE: Multiplanar and multiecho pulse sequences of the lumbar spine were obtained without and with intravenous contrast. CONTRAST:  53m MULTIHANCE GADOBENATE DIMEGLUMINE 529 MG/ML IV SOLN COMPARISON:  Lumbar spine x-rays dated February 10, 2014. MRI lumbar spine report dated  December 15, 2010. FINDINGS: Segmentation:  Standard. Alignment:  Trace anterolisthesis of L3 on L4, unchanged. Vertebrae: Mild increased edema and enhancement within the superior endplate of TZ16 consistent with subacute compression fracture. There is approximately 25% height loss. No retropulsion. Chronic central superior endplate deformity of the T12 vertebral body. No evidence of discitis or focal bone lesion. Conus medullaris: Extends to the L1 level and appears normal. No abnormal enhancement identified. Paraspinal and other soft tissues: Bilateral renal cysts. Otherwise negative. Disc levels: T12-L1:  Normal. L1-L2:  Normal. L2-L3: Small broad-based disc bulge without spinal canal or neuroforaminal stenosis. L3-L4: Small posterior disc bulge and bilateral facet arthropathy resulting in mild central spinal canal stenosis and moderate left neuroforaminal stenosis, unchanged. No right neuroforaminal stenosis. L4-L5: Diffuse disc bulge and bilateral facet arthropathy resulting in moderate central spinal canal stenosis, mild narrowing of the bilateral lateral recesses, and moderate left and mild right neuroforaminal stenosis. L5-S1: Small diffuse disc bulge and mild bilateral facet arthropathy without spinal canal or neuroforaminal stenosis. IMPRESSION: 1. Subacute compression deformity of the T11 vertebral body with approximately 25% height loss. 2. Central superior endplate compression deformity of the T12 vertebral body, new from prior studies, but chronic in appearance. 3. Worsened degenerative disc disease at L4-L5 where there is now moderate central spinal canal stenosis and moderate left neuroforaminal stenosis. 4. Unchanged mild central spinal canal stenosis and moderate left neuroforaminal stenosis at L3-L4. Electronically Signed   By: WTitus DubinM.D.   On: 06/18/2017 17:30   Note: Imaging results reviewed.          Meds   Current Meds  Medication Sig  . albuterol-ipratropium (COMBIVENT)  18-103 MCG/ACT inhaler Inhale 2 puffs  into the lungs every 6 (six) hours as needed.  . ALPRAZolam (XANAX) 1 MG tablet Take 1 tablet (1 mg total) by mouth 4 (four) times daily as needed.  Marland Kitchen buPROPion (WELLBUTRIN SR) 150 MG 12 hr tablet Take 150 mg by mouth 2 (two) times daily.   . Cholecalciferol (VITAMIN D3) 5000 units TABS Take 1 tablet (5,000 Units total) by mouth daily.  Marland Kitchen escitalopram (LEXAPRO) 10 MG tablet Take 10 mg by mouth daily.   . Fluticasone-Salmeterol (ADVAIR) 100-50 MCG/DOSE AEPB Inhale 1 puff into the lungs every 12 (twelve) hours.  . Ipratropium-Albuterol (COMBIVENT) 20-100 MCG/ACT AERS respimat Inhale 2 puffs into the lungs every 6 (six) hours as needed for wheezing or shortness of breath.   . levothyroxine (SYNTHROID, LEVOTHROID) 88 MCG tablet Take 88 mcg by mouth daily before breakfast.   . oxyCODONE (OXY IR/ROXICODONE) 5 MG immediate release tablet Take 1 tablet (5 mg total) by mouth 3 (three) times daily. Max: 3/day  . [START ON 06/26/2017] oxyCODONE (OXY IR/ROXICODONE) 5 MG immediate release tablet Take 1 tablet (5 mg total) by mouth 2 (two) times daily. Max: 2/day  . [START ON 07/03/2017] oxyCODONE (OXY IR/ROXICODONE) 5 MG immediate release tablet Take 1 tablet (5 mg total) by mouth daily. Max: 1/day  . pantoprazole (PROTONIX) 40 MG tablet Take 40 mg by mouth daily.   Marland Kitchen senna-docusate (SENOKOT-S) 8.6-50 MG tablet Take 1 tablet by mouth at bedtime.     ROS  Constitutional: Denies any fever or chills Gastrointestinal: No reported hemesis, hematochezia, vomiting, or acute GI distress Musculoskeletal: Denies any acute onset joint swelling, redness, loss of ROM, or weakness Neurological: No reported episodes of acute onset apraxia, aphasia, dysarthria, agnosia, amnesia, paralysis, loss of coordination, or loss of consciousness  Allergies  [name] has No Known Allergies.  [name]  Drug: [name]  reports that she does not use drugs. Alcohol:  reports that she does not drink  alcohol. Tobacco:  reports that she has been smoking Cigarettes.  She has been smoking about 0.50 packs per day. She has never used smokeless tobacco. Medical:  has a past medical history of Anxiety; CAD (coronary artery disease); Carpal tunnel syndrome; Complication of anesthesia; COPD (chronic obstructive pulmonary disease) ([place]); CRP elevated (09/14/2015); Depression; Difficult intubation; Dyslipidemia; Dysrhythmia; Elevated sedimentation rate (09/14/2015); Esophageal spasm; Gastrointestinal parasites; Hiatal hernia; History of peptic ulcer disease; Hyperthyroidism; Hypothyroidism; Low magnesium levels (09/14/2015); Pelvic fracture ([place]) (2008); Reflux; and Rotator cuff injury. Surgical: [name]  has a past surgical history that includes Cesarean section; Neck surgery; Cholecystectomy; Carpal tunnel release; Rotator cuff repair; Shoulder arthroscopy (12/07/2011); Shoulder surgery (12/07/2011); and Intramedullary (im) nail intertrochanteric (Right, 10/01/2016). Family: family history includes Aneurysm in her unknown relative.  Constitutional Exam  General appearance: Well nourished, well developed, and well hydrated. In no apparent acute distress Vitals:   06/25/17 1431  BP: (!) 165/85  Pulse: (!) 103  Resp: 16  Temp: 98.6 F (37 C)  SpO2: 97%  Weight: 98 lb (44.5 kg)  Height: 5' 1"  (1.549 m)   BMI Assessment: Estimated body mass index is 18.52 kg/m as calculated from the following:   Height as of this encounter: 5' 1"  (1.549 m).   Weight as of this encounter: 98 lb (44.5 kg).  BMI interpretation table: BMI level Category Range association with higher incidence of chronic pain  <18 kg/m2 Underweight   18.5-24.9 kg/m2 Ideal body weight   25-29.9 kg/m2 Overweight Increased incidence by 20%  30-34.9 kg/m2 Obese (Class I) Increased  incidence by 68%  35-39.9 kg/m2 Severe obesity (Class II) Increased incidence by 136%  >40 kg/m2 Extreme obesity (Class III) Increased incidence by 254%    BMI Readings from Last 4 Encounters:  06/25/17 18.52 kg/m  06/05/17 16.82 kg/m  05/08/17 20.25 kg/m  02/20/17 19.74 kg/m   Wt Readings from Last 4 Encounters:  06/25/17 98 lb (44.5 kg)  06/05/17 98 lb (44.5 kg)  05/08/17 118 lb (53.5 kg)  02/20/17 115 lb (52.2 kg)  Psych/Mental status: Alert, oriented x 3 (person, place, & time)       Eyes: PERLA Respiratory: No evidence of acute respiratory distress  Cervical Spine Area Exam  Skin & Axial Inspection: No masses, redness, edema, swelling, or associated skin lesions Alignment: Symmetrical Functional ROM: Unrestricted ROM      Stability: No instability detected Muscle Tone/Strength: Functionally intact. No obvious neuro-muscular anomalies detected. Sensory (Neurological): Unimpaired Palpation: No palpable anomalies              Upper Extremity (UE) Exam    Side: Right upper extremity  Side: Left upper extremity  Skin & Extremity Inspection: Skin color, temperature, and hair growth are WNL. No peripheral edema or cyanosis. No masses, redness, swelling, asymmetry, or associated skin lesions. No contractures.  Skin & Extremity Inspection: Skin color, temperature, and hair growth are WNL. No peripheral edema or cyanosis. No masses, redness, swelling, asymmetry, or associated skin lesions. No contractures.  Functional ROM: Unrestricted ROM          Functional ROM: Unrestricted ROM          Muscle Tone/Strength: Functionally intact. No obvious neuro-muscular anomalies detected.  Muscle Tone/Strength: Functionally intact. No obvious neuro-muscular anomalies detected.  Sensory (Neurological): Unimpaired  Sensory (Neurological): Unimpaired  Palpation: No palpable anomalies              Palpation: No palpable anomalies              Specialized Test(s): Deferred         Specialized Test(s): Deferred          Thoracic Spine Area Exam  Skin & Axial Inspection: No masses, redness, or swelling Alignment: Symmetrical Functional ROM:  Unrestricted ROM Stability: No instability detected Muscle Tone/Strength: Functionally intact. No obvious neuro-muscular anomalies detected. Sensory (Neurological): Unimpaired Muscle strength & Tone: No palpable anomalies  Lumbar Spine Area Exam  Skin & Axial Inspection: No masses, redness, or swelling Alignment: Symmetrical Functional ROM: Decreased ROM      Stability: No instability detected Muscle Tone/Strength: Functionally intact. No obvious neuro-muscular anomalies detected. Sensory (Neurological): Movement-associated pain Palpation: Complains of area being tender to palpation       Provocative Tests: Lumbar Hyperextension and rotation test: Positive bilaterally for facet joint pain. Lumbar Lateral bending test: evaluation deferred today       Patrick's Maneuver: Positive for right-sided S-I arthralgia              Gait & Posture Assessment  Ambulation: Unassisted Gait: Relatively normal for age and body habitus Posture: WNL   Lower Extremity Exam    Side: Right lower extremity  Side: Left lower extremity  Skin & Extremity Inspection: Skin color, temperature, and hair growth are WNL. No peripheral edema or cyanosis. No masses, redness, swelling, asymmetry, or associated skin lesions. No contractures.  Skin & Extremity Inspection: Skin color, temperature, and hair growth are WNL. No peripheral edema or cyanosis. No masses, redness, swelling, asymmetry, or associated skin lesions. No contractures.  Functional ROM: Unrestricted  ROM          Functional ROM: Unrestricted ROM          Muscle Tone/Strength: Functionally intact. No obvious neuro-muscular anomalies detected.  Muscle Tone/Strength: Functionally intact. No obvious neuro-muscular anomalies detected.  Sensory (Neurological): Unimpaired  Sensory (Neurological): Unimpaired  Palpation: No palpable anomalies  Palpation: No palpable anomalies   Assessment  Primary Diagnosis & Pertinent Problem List: The primary encounter  diagnosis was Chronic low back pain (Location of Primary Source of Pain) (Bilateral) (R>L). Diagnoses of Chronic sacroiliac joint pain (Right), Lumbar facet syndrome (Location of Primary Source of Pain) (Bilateral) (R>L), Lumbar facet hypertrophy (Bilateral), and Lumbar spondylosis were also pertinent to this visit.  Status Diagnosis  Persistent Persistent Persistent 1. Chronic low back pain (Location of Primary Source of Pain) (Bilateral) (R>L)   2. Chronic sacroiliac joint pain (Right)   3. Lumbar facet syndrome (Location of Primary Source of Pain) (Bilateral) (R>L)   4. Lumbar facet hypertrophy (Bilateral)   5. Lumbar spondylosis     Problems updated and reviewed during this visit: No problems updated. Plan of Care  Pharmacotherapy (Medications Ordered): No orders of the defined types were placed in this encounter.  New Prescriptions   No medications on file   Medications administered today: [name] had no medications administered during this visit.  Orders:  Procedure Orders     LUMBAR FACET(MEDIAL BRANCH NERVE BLOCK) MBNB     SACROILIAC JOINT INJECTINS Lab Orders  No laboratory test(s) ordered today   Imaging Orders  No imaging studies ordered today   Referral Orders  No referral(s) requested today    Interventional management options: Planned, scheduled, and/or pending:   Diagnostic right-sided  Lumbar facet + sacroiliac joint block under fluoroscopic guidance and IV sedation    Considering:   Diagnostic bilateral cervical facetblock  Possible bilateral cervical facet radiofrequencyablation  Diagnostic right-sided cervical epidural steroid injection  Diagnostic bilateral intra-articular shoulderjoint injection  Diagnostic bilateral suprascapularnerve block  Possible bilateral suprascapular nerve radiofrequencyablation  Diagnostic bilateral lumbar facet block  Possible bilateral lumbar facet radiofrequencyablation  Diagnostic left-sided L3-4  transforaminal epiduralsteroid injection  Diagnostic L3-4 versus L4-5 lumbar epiduralsteroid injection    Palliative PRN treatment(s):   Diagnostic bilateral cervical facetblock  Diagnostic right-sided cervical epiduralsteroid injection  Diagnostic bilateral intra-articular shoulderjoint injection  Diagnostic bilateral suprascapularnerve block  Diagnostic bilateral lumbar facetblock  Diagnostic left-sided L3-4 transforaminal epiduralsteroid injection  Diagnostic L3-4 versus L4-5 lumbar epiduralsteroid injection    Provider-requested follow-up: Return for Procedure (with sedation): Right lumbar facet + sacroiliac joint block.  Future Appointments Date Time Provider [place]  06/26/2017 2:00 PM Milinda Pointer, MD [facility] None   Primary Care Physician: Albina Billet, MD Location: [facility] Note by: Gaspar Cola, MD Date: 06/25/2017; Time: 3:02 PM  Patient Instructions  ____________________________________________________________________________________________  Preparing for Procedure with Sedation Instructions: . Oral Intake: Do not eat or drink anything for at least 8 hours prior to your procedure. . Transportation: Public transportation is not allowed. Bring an adult driver. The driver must be physically present in our waiting room before any procedure can be started. Marland Kitchen Physical Assistance: Bring an adult physically capable of assisting you, in the event you need help. This adult should keep you company at home for at least 6 hours after the procedure. . Blood Pressure Medicine: Take your blood pressure medicine with a sip of water the morning of the procedure. . Blood thinners:  . Diabetics on insulin: Notify the  staff so that you can be scheduled 1st case in the morning. If your diabetes requires high dose insulin, take only  of your normal insulin dose the morning of the procedure and notify the staff that you have done  so. . Preventing infections: Shower with an antibacterial soap the morning of your procedure. . Build-up your immune system: Take 1000 mg of Vitamin C with every meal (3 times a day) the day prior to your procedure. Marland Kitchen Antibiotics: Inform the staff if you have a condition or reason that requires you to take antibiotics before dental procedures. . Pregnancy: If you are pregnant, call and cancel the procedure. . Sickness: If you have a cold, fever, or any active infections, call and cancel the procedure. . Arrival: You must be in the facility at least 30 minutes prior to your scheduled procedure. . Children: Do not bring children with you. . Dress appropriately: Bring dark clothing that you would not mind if they get stained. . Valuables: Do not bring any jewelry or valuables. Procedure appointments are reserved for interventional treatments only. Marland Kitchen No Prescription Refills. . No medication changes will be discussed during procedure appointments. . No disability issues will be discussed. ____________________________________________________________________________________________

## 2017-06-26 ENCOUNTER — Ambulatory Visit: Payer: Medicare HMO | Admitting: Pain Medicine

## 2017-06-26 ENCOUNTER — Ambulatory Visit (HOSPITAL_BASED_OUTPATIENT_CLINIC_OR_DEPARTMENT_OTHER): Payer: Medicare HMO | Admitting: Pain Medicine

## 2017-06-26 ENCOUNTER — Encounter: Payer: Self-pay | Admitting: Pain Medicine

## 2017-06-26 ENCOUNTER — Ambulatory Visit
Admission: RE | Admit: 2017-06-26 | Discharge: 2017-06-26 | Disposition: A | Discharge status: Home / Self Care | Payer: Medicare HMO | Source: Ambulatory Visit | Attending: Pain Medicine | Admitting: Pain Medicine

## 2017-06-26 VITALS — BP 125/106 | HR 122 | Temp 99.2°F | Resp 20 | Ht 64.0 in | Wt 98.0 lb

## 2017-06-26 DIAGNOSIS — M8938 Hypertrophy of bone, other site: Secondary | ICD-10-CM | POA: Insufficient documentation

## 2017-06-26 DIAGNOSIS — M545 Low back pain: Secondary | ICD-10-CM

## 2017-06-26 DIAGNOSIS — G8929 Other chronic pain: Secondary | ICD-10-CM | POA: Diagnosis not present

## 2017-06-26 DIAGNOSIS — M47896 Other spondylosis, lumbar region: Secondary | ICD-10-CM | POA: Insufficient documentation

## 2017-06-26 DIAGNOSIS — M533 Sacrococcygeal disorders, not elsewhere classified: Secondary | ICD-10-CM

## 2017-06-26 DIAGNOSIS — M4696 Unspecified inflammatory spondylopathy, lumbar region: Secondary | ICD-10-CM | POA: Diagnosis not present

## 2017-06-26 DIAGNOSIS — M47816 Spondylosis without myelopathy or radiculopathy, lumbar region: Secondary | ICD-10-CM

## 2017-06-26 MED ORDER — MIDAZOLAM HCL 5 MG/5ML IJ SOLN
1.0000 mg | INTRAMUSCULAR | Status: DC | PRN
  Administered 2017-06-26: 4 mg via INTRAVENOUS
  Filled 2017-06-26: qty 5

## 2017-06-26 MED ORDER — LIDOCAINE HCL (PF) 1.5 % IJ SOLN
20.0000 mL | Freq: Once | INTRAMUSCULAR | Status: DC
  Filled 2017-06-26: qty 20

## 2017-06-26 MED ORDER — ROPIVACAINE HCL 2 MG/ML IJ SOLN
4.0000 mL | Freq: Once | INTRAMUSCULAR | Status: DC
  Filled 2017-06-26: qty 10

## 2017-06-26 MED ORDER — FENTANYL CITRATE (PF) 100 MCG/2ML IJ SOLN
25.0000 ug | INTRAMUSCULAR | Status: DC | PRN
  Administered 2017-06-26: 50 ug via INTRAVENOUS
  Filled 2017-06-26: qty 2

## 2017-06-26 MED ORDER — ROPIVACAINE HCL 2 MG/ML IJ SOLN
9.0000 mL | Freq: Once | INTRAMUSCULAR | Status: DC
  Filled 2017-06-26: qty 10

## 2017-06-26 MED ORDER — TRIAMCINOLONE ACETONIDE 40 MG/ML IJ SUSP
40.0000 mg | Freq: Once | INTRAMUSCULAR | Status: DC
  Filled 2017-06-26: qty 1

## 2017-06-26 MED ORDER — METHYLPREDNISOLONE ACETATE 80 MG/ML IJ SUSP
80.0000 mg | Freq: Once | INTRAMUSCULAR | Status: DC
  Filled 2017-06-26: qty 1

## 2017-06-26 MED ORDER — LACTATED RINGERS IV SOLN
1000.0000 mL | Freq: Once | INTRAVENOUS | Status: AC
  Administered 2017-06-26: 1000 mL via INTRAVENOUS

## 2017-06-26 NOTE — Patient Instructions (Signed)

## 2017-06-26 NOTE — Progress Notes (Signed)
Patient's Name: [name]  MRN: [id number]  Referring Provider: Milinda Pointer, MD  DOB: 1946/03/15  PCP: Albina Billet, MD  DOS: 06/26/2017  Note by: Gaspar Cola, MD  Service setting: Ambulatory outpatient  Specialty: Interventional Pain Management  Patient type: Established  Location: [facility] (AMB) Pain Management Facility  Visit type: Interventional Procedure   Primary Reason for Visit: Interventional Pain Management Treatment. CC: Back Pain (lower left)  Procedure:  Anesthesia, Analgesia, Anxiolysis:  Procedure #1: Type: Diagnostic Medial Branch Facet Block Region: Lumbar Level: L2, L3, L4, L5, & S1 Medial Branch Level(s) Laterality: Right  Procedure #2: Type: Diagnostic Sacroiliac Joint Block Region: Posterior Lumbosacral Level: PSIS (Posterior Superior Iliac Spine) Sacroiliac Joint Laterality: Right  Type: Local Anesthesia with Moderate (Conscious) Sedation Local Anesthetic: Lidocaine 1% Route: Intravenous (IV) IV Access: Secured Sedation: Meaningful verbal contact was maintained at all times during the procedure  Indication(s): Analgesia and Anxiety  Indications: 1. Chronic low back pain (Location of Primary Source of Pain) (Bilateral) (R>L)   2. Lumbar facet syndrome (Location of Primary Source of Pain) (Bilateral) (R>L)   3. Lumbar spondylosis   4. Lumbar facet hypertrophy (Bilateral)   5. Chronic sacroiliac joint pain (Right)    Pain Score: Pre-procedure: 8 /10 Post-procedure: 0-No pain/10  Pre-op Assessment:  Previous date of service: 06/25/17 Service provided: Med Refill [name] is a 71 y.o. (year old), female patient, seen today for interventional treatment. She  has a past surgical history that includes Cesarean section; Neck surgery; Cholecystectomy; Carpal tunnel release; Rotator cuff repair; Shoulder arthroscopy (12/07/2011); Shoulder surgery (12/07/2011); and Intramedullary (im) nail intertrochanteric (Right, 10/01/2016). [name] has a current  medication list which includes the following prescription(s): albuterol-ipratropium, alprazolam, bupropion, vitamin d3, escitalopram, fluticasone-salmeterol, ipratropium-albuterol, levothyroxine, oxycodone, oxycodone, oxycodone, pantoprazole, senna-docusate, magnesium oxide, and tizanidine, and the following Facility-Administered Medications: fentanyl, lactated ringers, lidocaine, lidocaine, methylprednisolone acetate, midazolam, ropivacaine (pf) 2 mg/ml (0.2%), ropivacaine (pf) 2 mg/ml (0.2%), and triamcinolone acetonide. Her primarily concern today is the Back Pain (lower left)  Initial Vital Signs: Blood pressure (!) 165/92, pulse (!) 122, temperature 98 F (36.7 C), temperature source Oral, resp. rate 16, height 5\' 4"  (1.626 m), weight 98 lb (44.5 kg), SpO2 98 %. BMI: Estimated body mass index is 16.82 kg/m as calculated from the following:   Height as of this encounter: 5\' 4"  (1.626 m).   Weight as of this encounter: 98 lb (44.5 kg).  Risk Assessment: Allergies: Reviewed. She has No Known Allergies.  Allergy Precautions: None required Coagulopathies: Reviewed. None identified.  Blood-thinner therapy: None at this time Active Infection(s): Reviewed. None identified. [name] is afebrile  Site Confirmation: [name] was asked to confirm the procedure and laterality before marking the site Procedure checklist: Completed Consent: Before the procedure and under the influence of no sedative(s), amnesic(s), or anxiolytics, the patient was informed of the treatment options, risks and possible complications. To fulfill our ethical and legal obligations, as recommended by the American Medical Association's Code of Ethics, I have informed the patient of my clinical impression; the nature and purpose of the treatment or procedure; the risks, benefits, and possible complications of the intervention; the alternatives, including doing nothing; the risk(s) and benefit(s) of the alternative treatment(s) or  procedure(s); and the risk(s) and benefit(s) of doing nothing. The patient was provided information about the general risks and possible complications associated with the procedure. These may include, but are not limited to: failure to achieve desired goals, infection, bleeding, organ or nerve  damage, allergic reactions, paralysis, and death. In addition, the patient was informed of those risks and complications associated to Spine-related procedures, such as failure to decrease pain; infection (i.e.: Meningitis, epidural or intraspinal abscess); bleeding (i.e.: epidural hematoma, subarachnoid hemorrhage, or any other type of intraspinal or peri-dural bleeding); organ or nerve damage (i.e.: Any type of peripheral nerve, nerve root, or spinal cord injury) with subsequent damage to sensory, motor, and/or autonomic systems, resulting in permanent pain, numbness, and/or weakness of one or several areas of the body; allergic reactions; (i.e.: anaphylactic reaction); and/or death. Furthermore, the patient was informed of those risks and complications associated with the medications. These include, but are not limited to: allergic reactions (i.e.: anaphylactic or anaphylactoid reaction(s)); adrenal axis suppression; blood sugar elevation that in diabetics may result in ketoacidosis or comma; water retention that in patients with history of congestive heart failure may result in shortness of breath, pulmonary edema, and decompensation with resultant heart failure; weight gain; swelling or edema; medication-induced neural toxicity; particulate matter embolism and blood vessel occlusion with resultant organ, and/or nervous system infarction; and/or aseptic necrosis of one or more joints. Finally, the patient was informed that Medicine is not an exact science; therefore, there is also the possibility of unforeseen or unpredictable risks and/or possible complications that may result in a catastrophic outcome. The patient  indicated having understood very clearly. We have given the patient no guarantees and we have made no promises. Enough time was given to the patient to ask questions, all of which were answered to the patient's satisfaction. [name] has indicated that she wanted to continue with the procedure. Attestation: I, the ordering provider, attest that I have discussed with the patient the benefits, risks, side-effects, alternatives, likelihood of achieving goals, and potential problems during recovery for the procedure that I have provided informed consent. Date: 06/26/2017; Time: 2:17 PM  Pre-Procedure Preparation:  Monitoring: As per clinic protocol. Respiration, ETCO2, SpO2, BP, heart rate and rhythm monitor placed and checked for adequate function Safety Precautions: Patient was assessed for positional comfort and pressure points before starting the procedure. Time-out: I initiated and conducted the "Time-out" before starting the procedure, as per protocol. The patient was asked to participate by confirming the accuracy of the "Time Out" information. Verification of the correct person, site, and procedure were performed and confirmed by me, the nursing staff, and the patient. "Time-out" conducted as per Joint Commission's Universal Protocol (UP.01.01.01). "Time-out" Date & Time: 06/26/2017; 1430 hrs.  Description of Procedure #1 Process:   Time-out: "Time-out" completed before starting procedure, as per protocol. Position: Prone Target Area: For Lumbar Facet blocks, the target is the groove formed by the junction of the transverse process and superior articular process. For the L5 dorsal ramus, the target is the notch between superior articular process and sacral ala. For the S1 dorsal ramus, the target is the superior and lateral edge of the posterior S1 Sacral foramen. Approach: Paramedial approach. Area Prepped: Entire Posterior Lumbosacral Region Prepping solution: ChloraPrep (2% chlorhexidine gluconate  and 70% isopropyl alcohol) Safety Precautions: Aspiration looking for blood return was conducted prior to all injections. At no point did we inject any substances, as a needle was being advanced. No attempts were made at seeking any paresthesias. Safe injection practices and needle disposal techniques used. Medications properly checked for expiration dates. SDV (single dose vial) medications used.  Description of the Procedure: Protocol guidelines were followed. The patient was placed in position over the fluoroscopy table. The target area was  identified and the area prepped in the usual manner. Skin desensitized using vapocoolant spray. Skin & deeper tissues infiltrated with local anesthetic. Appropriate amount of time allowed to pass for local anesthetics to take effect. The procedure needle was introduced through the skin, ipsilateral to the reported pain, and advanced to the target area. Employing the "Medial Branch Technique", the needles were advanced to the angle made by the superior and medial portion of the transverse process, and the lateral and inferior portion of the superior articulating process of the targeted vertebral bodies. This area is known as "Burton's Eye" or the "Eye of the [place] Dog". A procedure needle was introduced through the skin, and this time advanced to the angle made by the superior and medial border of the sacral ala, and the lateral border of the S1 vertebral body. This last needle was later repositioned at the superior and lateral border of the posterior S1 foramen. Negative aspiration confirmed. Solution injected in intermittent fashion, asking for systemic symptoms every 0.5cc of injectate. The needles were then removed and the area cleansed, making sure to leave some of the prepping solution back to take advantage of its long term bactericidal properties. Start Time: 1434 hrs. Materials:  Needle(s) Type: Regular needle Gauge: 22G Length: 3.5-in Medication(s): We  administered lactated ringers, midazolam, and fentaNYL. Please see chart orders for dosing details.  Description of Procedure # 2 Process:   Position: Prone Target Area: For upper sacroiliac joint block(s), the target is the superior and posterior margin of the sacroiliac joint. Approach: Ipsilateral approach. Area Prepped: Entire Posterior Lumbosacral Region Prepping solution: ChloraPrep (2% chlorhexidine gluconate and 70% isopropyl alcohol) Safety Precautions: Aspiration looking for blood return was conducted prior to all injections. At no point did we inject any substances, as a needle was being advanced. No attempts were made at seeking any paresthesias. Safe injection practices and needle disposal techniques used. Medications properly checked for expiration dates. SDV (single dose vial) medications used. Description of the Procedure: Protocol guidelines were followed. The patient was placed in position over the fluoroscopy table. The target area was identified and the area prepped in the usual manner. Skin desensitized using vapocoolant spray. Skin & deeper tissues infiltrated with local anesthetic. Appropriate amount of time allowed to pass for local anesthetics to take effect. The procedure needle was advanced under fluoroscopic guidance into the sacroiliac joint until a firm endpoint was obtained. Proper needle placement secured. Negative aspiration confirmed. Solution injected in intermittent fashion, asking for systemic symptoms every 0.5cc of injectate. The needles were then removed and the area cleansed, making sure to leave some of the prepping solution back to take advantage of its long term bactericidal properties. Vitals:   06/26/17 1441 06/26/17 1451 06/26/17 1501 06/26/17 1511  BP: (!) 149/119 (!) 153/92 (!) 142/103 (!) 125/106  Pulse:      Resp: 18 20 20 20   Temp:  99.5 F (37.5 C)  99.2 F (37.3 C)  TempSrc:      SpO2: 99% 95% 97% 98%  Weight:      Height:        End Time:  1441 hrs. Materials:  Needle(s) Type: Regular needle Gauge: 22G Length: 3.5-in Medication(s): We administered lactated ringers, midazolam, and fentaNYL. Please see chart orders for dosing details.  Imaging Guidance (Spinal):  Type of Imaging Technique: Fluoroscopy Guidance (Spinal) Indication(s): Assistance in needle guidance and placement for procedures requiring needle placement in or near specific anatomical locations not easily accessible without such assistance. Exposure Time:  Please see nurses notes. Contrast: None used. Fluoroscopic Guidance: I was personally present during the use of fluoroscopy. "Tunnel Vision Technique" used to obtain the best possible view of the target area. Parallax error corrected before commencing the procedure. "Direction-depth-direction" technique used to introduce the needle under continuous pulsed fluoroscopy. Once target was reached, antero-posterior, oblique, and lateral fluoroscopic projection used confirm needle placement in all planes. Images permanently stored in EMR. Interpretation: No contrast injected. I personally interpreted the imaging intraoperatively. Adequate needle placement confirmed in multiple planes. Permanent images saved into the patient's record.  Antibiotic Prophylaxis:  Indication(s): None identified Antibiotic given: None  Post-operative Assessment:  EBL: None Complications: No immediate post-treatment complications observed by team, or reported by patient. Note: The patient tolerated the entire procedure well. A repeat set of vitals were taken after the procedure and the patient was kept under observation following institutional policy, for this type of procedure. Post-procedural neurological assessment was performed, showing return to baseline, prior to discharge. The patient was provided with post-procedure discharge instructions, including a section on how to identify potential problems. Should any problems arise concerning this  procedure, the patient was given instructions to immediately contact us, at any time, without hesitation. In any case, we plan to contact the patient by telephone for a follow-up status report regarding this interventional procedure. Comments:  No additional relevant information.  Plan of Care  Disposition: Discharge home  Discharge Date & Time: 06/26/2017; 1515 hrs.  Physician-requested Follow-up:  Return for post-procedure eval by Dr. Dossie Arbour in 2 weeks.  New Prescriptions   No medications on file   Future Appointments Date Time Provider [place]  08/01/2017 1:30 PM Milinda Pointer, MD [facility] None    Imaging Orders     DG C-Arm 1-60 Min-No Report  Procedure Orders     SACROILIAC JOINT INJECTINS Medications ordered for procedure: Meds ordered this encounter  Medications  . lactated ringers infusion 1,000 mL  . midazolam (VERSED) 5 MG/5ML injection 1-2 mg    Make sure Flumazenil is available in the pyxis when using this medication. If oversedation occurs, administer 0.2 mg IV over 15 sec. If after 45 sec no response, administer 0.2 mg again over 1 min; may repeat at 1 min intervals; not to exceed 4 doses (1 mg)  . fentaNYL (SUBLIMAZE) injection 25-50 mcg    Make sure Narcan is available in the pyxis when using this medication. In the event of respiratory depression (RR< 8/min): Titrate NARCAN (naloxone) in increments of 0.1 to 0.2 mg IV at 2-3 minute intervals, until desired degree of reversal.  . lidocaine 1.5 % injection 20 mL    From block tray  . triamcinolone acetonide (KENALOG-40) injection 40 mg  . ropivacaine (PF) 2 mg/mL (0.2%) (NAROPIN) injection 9 mL  . methylPREDNISolone acetate (DEPO-MEDROL) injection 80 mg  . ropivacaine (PF) 2 mg/mL (0.2%) (NAROPIN) injection 4 mL  . lidocaine 1.5 % injection 20 mL   Medications administered: We administered lactated ringers, midazolam, and fentaNYL.  See the medical record for exact dosing, route, and time of  administration.  Primary Care Physician: Albina Billet, MD Location: [facility] Note by: Gaspar Cola, MD Date: 06/26/2017; Time: 3:32 PM  Disclaimer:  Medicine is not an Chief Strategy Officer. The only guarantee in medicine is that nothing is guaranteed. It is important to note that the decision to proceed with this intervention was based on the information collected from the patient. The Data and conclusions were drawn from  the patient's questionnaire, the interview, and the physical examination. Because the information was provided in large part by the patient, it cannot be guaranteed that it has not been purposely or unconsciously manipulated. Every effort has been made to obtain as much relevant data as possible for this evaluation. It is important to note that the conclusions that lead to this procedure are derived in large part from the available data. Always take into account that the treatment will also be dependent on availability of resources and existing treatment guidelines, considered by other Pain Management Practitioners as being common knowledge and practice, at the time of the intervention. For Medico-Legal purposes, it is also important to point out that variation in procedural techniques and pharmacological choices are the acceptable norm. The indications, contraindications, technique, and results of the above procedure should only be interpreted and judged by a Board-Certified Interventional Pain Specialist with extensive familiarity and expertise in the same exact procedure and technique.

## 2017-06-26 NOTE — Progress Notes (Signed)
Safety precautions to be maintained throughout the outpatient stay will include: orient to surroundings, keep bed in low position, maintain call bell within reach at all times, provide assistance with transfer out of bed and ambulation.  

## 2017-06-27 ENCOUNTER — Telehealth: Payer: Self-pay

## 2017-06-27 NOTE — Telephone Encounter (Signed)
States pain is good. Instructed to call if needed.

## 2017-07-04 ENCOUNTER — Encounter: Payer: Medicare HMO | Admitting: Pain Medicine

## 2017-07-10 ENCOUNTER — Ambulatory Visit: Payer: Medicare HMO | Admitting: Pain Medicine

## 2017-07-18 DIAGNOSIS — J449 Chronic obstructive pulmonary disease, unspecified: Secondary | ICD-10-CM | POA: Diagnosis not present

## 2017-07-18 DIAGNOSIS — E038 Other specified hypothyroidism: Secondary | ICD-10-CM | POA: Diagnosis not present

## 2017-07-18 DIAGNOSIS — Z23 Encounter for immunization: Secondary | ICD-10-CM | POA: Diagnosis not present

## 2017-07-18 DIAGNOSIS — I10 Essential (primary) hypertension: Secondary | ICD-10-CM | POA: Diagnosis not present

## 2017-07-26 DIAGNOSIS — M79675 Pain in left toe(s): Secondary | ICD-10-CM | POA: Diagnosis not present

## 2017-07-26 DIAGNOSIS — H2511 Age-related nuclear cataract, right eye: Secondary | ICD-10-CM | POA: Diagnosis not present

## 2017-07-26 DIAGNOSIS — M79674 Pain in right toe(s): Secondary | ICD-10-CM | POA: Diagnosis not present

## 2017-07-26 DIAGNOSIS — B351 Tinea unguium: Secondary | ICD-10-CM | POA: Diagnosis not present

## 2017-07-31 ENCOUNTER — Encounter: Payer: Self-pay | Admitting: *Deleted

## 2017-08-01 ENCOUNTER — Ambulatory Visit: Payer: Medicare HMO | Attending: Pain Medicine | Admitting: Pain Medicine

## 2017-08-01 ENCOUNTER — Encounter: Payer: Self-pay | Admitting: Pain Medicine

## 2017-08-01 VITALS — BP 144/64 | HR 93 | Temp 98.4°F | Resp 18 | Ht 64.0 in | Wt 108.0 lb

## 2017-08-01 DIAGNOSIS — M47816 Spondylosis without myelopathy or radiculopathy, lumbar region: Secondary | ICD-10-CM | POA: Diagnosis not present

## 2017-08-01 DIAGNOSIS — M4696 Unspecified inflammatory spondylopathy, lumbar region: Secondary | ICD-10-CM

## 2017-08-01 DIAGNOSIS — R69 Illness, unspecified: Secondary | ICD-10-CM | POA: Diagnosis not present

## 2017-08-01 DIAGNOSIS — E039 Hypothyroidism, unspecified: Secondary | ICD-10-CM | POA: Diagnosis not present

## 2017-08-01 DIAGNOSIS — G8929 Other chronic pain: Secondary | ICD-10-CM | POA: Insufficient documentation

## 2017-08-01 DIAGNOSIS — J449 Chronic obstructive pulmonary disease, unspecified: Secondary | ICD-10-CM | POA: Diagnosis not present

## 2017-08-01 DIAGNOSIS — F419 Anxiety disorder, unspecified: Secondary | ICD-10-CM | POA: Diagnosis not present

## 2017-08-01 DIAGNOSIS — X58XXXA Exposure to other specified factors, initial encounter: Secondary | ICD-10-CM | POA: Diagnosis not present

## 2017-08-01 DIAGNOSIS — M47896 Other spondylosis, lumbar region: Secondary | ICD-10-CM | POA: Diagnosis not present

## 2017-08-01 DIAGNOSIS — S72142A Displaced intertrochanteric fracture of left femur, initial encounter for closed fracture: Secondary | ICD-10-CM | POA: Insufficient documentation

## 2017-08-01 DIAGNOSIS — Z79891 Long term (current) use of opiate analgesic: Secondary | ICD-10-CM | POA: Insufficient documentation

## 2017-08-01 DIAGNOSIS — M25511 Pain in right shoulder: Secondary | ICD-10-CM | POA: Insufficient documentation

## 2017-08-01 DIAGNOSIS — M48061 Spinal stenosis, lumbar region without neurogenic claudication: Secondary | ICD-10-CM | POA: Insufficient documentation

## 2017-08-01 DIAGNOSIS — G894 Chronic pain syndrome: Secondary | ICD-10-CM

## 2017-08-01 DIAGNOSIS — F17200 Nicotine dependence, unspecified, uncomplicated: Secondary | ICD-10-CM | POA: Diagnosis not present

## 2017-08-01 DIAGNOSIS — F329 Major depressive disorder, single episode, unspecified: Secondary | ICD-10-CM | POA: Diagnosis not present

## 2017-08-01 DIAGNOSIS — M533 Sacrococcygeal disorders, not elsewhere classified: Secondary | ICD-10-CM | POA: Insufficient documentation

## 2017-08-01 DIAGNOSIS — E538 Deficiency of other specified B group vitamins: Secondary | ICD-10-CM | POA: Diagnosis not present

## 2017-08-01 DIAGNOSIS — E782 Mixed hyperlipidemia: Secondary | ICD-10-CM | POA: Insufficient documentation

## 2017-08-01 DIAGNOSIS — M545 Low back pain: Secondary | ICD-10-CM | POA: Diagnosis not present

## 2017-08-01 MED ORDER — OXYCODONE HCL 5 MG PO TABS: 5.0000 mg | ORAL_TABLET | Freq: Four times a day (QID) | ORAL | 0 refills | Status: DC | PRN

## 2017-08-01 NOTE — Progress Notes (Signed)
Patient's Name: [name]  MRN: [id number]  Referring Provider: Albina Billet, MD  DOB: [birth year]  PCP: Albina Billet, MD  DOS: 08/01/2017  Note by: Gaspar Cola, MD  Service setting: Ambulatory outpatient  Specialty: Interventional Pain Management  Location: [facility] (AMB) Pain Management Facility    Patient type: Established   Primary Reason(s) for Visit: Encounter for prescription drug management & post-procedure evaluation of chronic illness with mild to moderate exacerbation(Level of risk: moderate) CC: Back Pain (lower)  HPI  [name] is a 71 y.o. year old, female patient, who comes today for a post-procedure evaluation and medication management. 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); Cervical facet syndrome (Bilateral) (R>L); Cervical myofascial pain syndrome; Chronic low back pain (Primary Source of Pain) (Bilateral) (R>L); Lumbar spondylosis; Chronic shoulder impingement syndrome (Right); Low magnesium levels; CRP elevated; Elevated sedimentation rate; Chronic obstructive pulmonary disease (COPD) ([place]); Nicotine dependence; Chronic shoulder pain (Right); Abnormal nerve conduction studies; Chronic shoulder pain (Bilateral) (status post multiple surgeries) (R>L); Cervical facet hypertrophy (Bilateral); History of shoulder surgery 5 (Right); Lumbar foraminal stenosis (L3-4) (Left); 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 Her primarily concern today is the Back Pain (lower)  Pain Assessment: Location: Lower Back Radiating: right groin and right leg to the toes Onset: More than a month ago Duration: Chronic pain Quality: Sharp, Radiating Severity: 5 /10 (self-reported pain score)  Note: Reported level is inconsistent with clinical observations. Clinically the patient looks like a 2/10 Exaggerated score may be due to the reporting of a "suffering" component Timing: Constant Modifying factors: nothing  [name] was last seen on 06/26/2017 for a procedure. During today's appointment we reviewed [name] post-procedure results, as well as her outpatient medication regimen.  Further details on both, my assessment(s), as well as the proposed treatment plan, please see below.  Controlled Substance Pharmacotherapy Assessment REMS (Risk Evaluation and Mitigation Strategy)  Analgesic: Oxycodone IR 5 mg 1 tablet by mouth every 6 hours (20 mg/day of oxycodone) (30 MME/Day) MME/day: 30 mg/day.   Note: The patient recently completed a "drug holiday". Landis Martins, RN  08/01/2017  1:47 PM  Sign at close encounter Safety precautions to be maintained throughout the outpatient stay will include: orient to surroundings, keep bed in low position, maintain call bell within reach at all times, provide assistance with transfer out of bed and ambulation.    Pharmacokinetics: Liberation and absorption (onset of action): WNL Distribution (time to peak effect): WNL Metabolism and excretion (duration of action): WNL         Pharmacodynamics: Desired effects: Analgesia: [name] reports >50% benefit. Functional ability:  Patient reports that medication allows her to accomplish basic ADLs Clinically meaningful improvement in function (CMIF): Sustained CMIF goals met Perceived effectiveness: Described as relatively effective, allowing for increase in activities of daily  living (ADL) Undesirable effects: Side-effects or Adverse reactions: None reported Monitoring: [place] PMP: Online review of the past 67-monthperiod conducted. Compliant with practice rules and regulations List of all UDS test(s) done:  Lab Results  Component Value Date   TOXASSSELUR FINAL 11/28/2016   TOzawkieFINAL 05/18/2016   TSuperiorFINAL 02/28/2016   TErnestFINAL 12/08/2015   SUMMARY FINAL 06/05/2017   Last UDS on record: [employer] Select 13  Date Value Ref Range Status  11/28/2016 FINAL  Final    Comment:    ==================================================================== TOXASSURE SELECT 13 (MW) ==================================================================== Test                             Result       Flag       Units Drug Present and Declared for Prescription Verification   Alprazolam                     173          EXPECTED   ng/mg creat   Alpha-hydroxyalprazolam        >2058        EXPECTED   ng/mg creat    Source of alprazolam is a scheduled prescription medication.    Alpha-hydroxyalprazolam is an expected metabolite of alprazolam.   Morphine                       1221         EXPECTED   ng/mg creat    Potential sources of large amounts of morphine in the absence of    codeine include administration of morphine or use of heroin.   Oxymorphone                    126          EXPECTED   ng/mg creat   Noroxycodone                   279          EXPECTED   ng/mg creat   Noroxymorphone                 52           EXPECTED   ng/mg creat    Oxymorphone, noroxycodone and noroxymorphone are expected    metabolites of oxycodone. Noroxymorphone is an expected    metabolite of oxymorphone. Sources of oxycodone  and/or    oxymorphone include scheduled prescription medications. Drug Present not Declared for Prescription Verification   Hydromorphone                  112          UNEXPECTED ng/mg creat    Hydromorphone may be administered as a scheduled prescription    medication and is also an expected metabolite of hydrocodone.    Hydromorphone is also a minor metabolite of morphine which is    also present in this specimen. Concentrations of hydromorphone    rarely exceed 5% of the morphine concentration when metabolism of    morphine is the sole source of hydromorphone. Drug Absent but Declared for Prescription Verification   Oxycodone                      Not Detected UNEXPECTED ng/mg creat    Oxycodone is almost always present in patients taking this  drug    consistently.  Absence of oxycodone could be due to lapse of time    since the last dose or unusual pharmacokinetics (rapid    metabolism). ==================================================================== Test                      Result    Flag   Units      Ref Range   Creatinine              243              mg/dL      >=20 ==================================================================== Declared Medications:  The flagging and interpretation on this report are based on the  following declared medications.  Unexpected results may arise from  inaccuracies in the declared medications.  **Note: The testing scope of this panel includes these medications:  Alprazolam  Morphine (Morphine Sulfate)  Oxycodone  **Note: The testing scope of this panel does not include following  reported medications:  Albuterol (Ipratropium-Albuterol)  Atorvastatin  Bupropion  Cholecalciferol  Docusate (Senokot-S)  Escitalopram  Fluticasone  Ipratropium (Ipratropium-Albuterol)  Levothyroxine  Magnesium Oxide  Pantoprazole  Salmeterol  Sennosides (Senokot-S)  Tizanidine   Trazodone ==================================================================== For clinical consultation, please call [phone]. ====================================================================    Summary  Date Value Ref Range Status  06/05/2017 FINAL  Final    Comment:    ==================================================================== TOXASSURE SELECT 13 (MW) ==================================================================== Test                             Result       Flag       Units Drug Present and Declared for Prescription Verification   Alprazolam                     328          EXPECTED   ng/mg creat   Alpha-hydroxyalprazolam        1837         EXPECTED   ng/mg creat    Source of alprazolam is a scheduled prescription medication.    Alpha-hydroxyalprazolam is an expected metabolite of alprazolam.   Morphine                       162          EXPECTED   ng/mg creat    Potential sources of morphine include administration of codeine    or morphine, use of heroin, or ingestion of poppy seeds. Drug Present not Declared for Prescription Verification   Hydrocodone                    2096         UNEXPECTED ng/mg creat   Hydromorphone                  637          UNEXPECTED ng/mg creat   Dihydrocodeine                 200          UNEXPECTED ng/mg creat   Norhydrocodone                 2725         UNEXPECTED ng/mg creat    Sources of hydrocodone include scheduled prescription    medications. Dihydrocodeine and norhydrocodone are expected  metabolites of hydrocodone. Dihydrocodeine is also available as a    scheduled prescription medication.    Hydromorphone may be administered as a scheduled prescription    medication and is also an expected metabolite of hydrocodone and    a minor metabolite of morphine. Hydrocodone and/or its    metabolites and morphine are also present in this specimen. Drug Absent but Declared for Prescription Verification   Oxycodone                       Not Detected UNEXPECTED ng/mg creat ==================================================================== Test                      Result    Flag   Units      Ref Range   Creatinine              81               mg/dL      >=20 ==================================================================== Declared Medications:  The flagging and interpretation on this report are based on the  following declared medications.  Unexpected results may arise from  inaccuracies in the declared medications.  **Note: The testing scope of this panel includes these medications:  Alprazolam  Morphine (Morphine Sulfate)  Oxycodone  **Note: The testing scope of this panel does not include following  reported medications:  Albuterol (Combivent)  Bupropion  Cyanocobalamin  Docusate (Senokot-S)  Escitalopram  Fluticasone (Advair)  Ipratropium (Combivent)  Levothyroxine  Magnesium Oxide  Pantoprazole  Salmeterol (Advair)  Sennosides (Senokot-S)  Tizanidine  Vitamin D3 ==================================================================== For clinical consultation, please call [phone]. ====================================================================    UDS interpretation: Non-Compliant The patient was given a final warning about the accuracy of reporting medications. Medication Assessment Form: Reviewed. Patient indicates being compliant with therapy Treatment compliance: Compliant Risk Assessment Profile: Aberrant behavior: See prior evaluations. None observed or detected today Comorbid factors increasing risk of overdose: See prior notes. No additional risks detected today Risk of substance use disorder (SUD): Low     Opioid Risk Tool - 06/26/17 1342      Family History of Substance Abuse   Alcohol Negative   Illegal Drugs Negative   Rx Drugs Negative     Personal History of Substance Abuse   Alcohol Negative   Illegal Drugs Negative   Rx Drugs Negative      Psychological Disease   Psychological Disease Positive   OCD Negative   Bipolar Negative   Schizophrenia Negative   Depression Positive     Total Score   Opioid Risk Tool Scoring 3   Opioid Risk Interpretation Low Risk     ORT Scoring interpretation table:  Score <3 = Low Risk for SUD  Score between 4-7 = Moderate Risk for SUD  Score >8 = High Risk for Opioid Abuse   Risk Mitigation Strategies:  Patient Counseling: Covered Patient-Prescriber Agreement (PPA): Present and active  Notification to other healthcare providers: Done  Pharmacologic Plan: No change in therapy, at this time  Post-Procedure Assessment  06/26/2017 Procedure: Diagnostic right-sided Lumbarfacet + sacroiliac joint blockunder fluoroscopic guidance and IV sedation  Pre-procedure pain score:  8/10 Post-procedure pain score: 0/10 (100% relief) Influential Factors: BMI: 18.54 kg/m Intra-procedural challenges: None observed.         Assessment challenges: None detected.              Reported side-effects: None.        Post-procedural adverse reactions or  complications: None reported         Sedation: Sedation provided. When no sedatives are used, the analgesic levels obtained are directly associated to the effectiveness of the local anesthetics. However, when sedation is provided, the level of analgesia obtained during the initial 1 hour following the intervention, is believed to be the result of a combination of factors. These factors may include, but are not limited to: 1. The effectiveness of the local anesthetics used. 2. The effects of the analgesic(s) and/or anxiolytic(s) used. 3. The degree of discomfort experienced by the patient at the time of the procedure. 4. The patients ability and reliability in recalling and recording the events. 5. The presence and influence of possible secondary gains and/or psychosocial factors. Reported result: Relief experienced during the 1st hour after the procedure: 100 %  (Ultra-Short Term Relief) [name] has indicated area to have been numb during this time. Interpretative annotation: Clinically appropriate result. Analgesia during this period is likely to be Local Anesthetic and/or IV Sedative (Analgesic/Anxiolytic) related.          Effects of local anesthetic: The analgesic effects attained during this period are directly associated to the localized infiltration of local anesthetics and therefore cary significant diagnostic value as to the etiological location, or anatomical origin, of the pain. Expected duration of relief is directly dependent on the pharmacodynamics of the local anesthetic used. Long-acting (4-6 hours) anesthetics used.  Reported result: Relief during the next 4 to 6 hour after the procedure: 100 % (Short-Term Relief) [name] has indicated area to have been numb during this time. Interpretative annotation: Clinically appropriate result. Analgesia during this period is likely to be Local Anesthetic-related.          Long-term benefit: Defined as the period of time past the expected duration of local anesthetics (1 hour for short-acting and 4-6 hours for long-acting). With the possible exception of prolonged sympathetic blockade from the local anesthetics, benefits during this period are typically attributed to, or associated with, other factors such as analgesic sensory neuropraxia, antiinflammatory effects, or beneficial biochemical changes provided by agents other than the local anesthetics.  Reported result: Extended relief following procedure: 0 % (Long-Term Relief)            Interpretative annotation: Clinically possible results. No long-term benefit. No long-term benefit attained. Etiology is likely mechanical rather than inflammatory.          Current benefits: Defined as persistent relief that continues at this point in time.   Reported results: Treated area: 0 % [name] reports improvement in function Interpretative annotation:  Recurrence of symptoms. Limited therapeutic benefit. Results would suggest persistent aggravating factors.          Interpretation: Results would suggest a successful diagnostic intervention.                  Plan:  Proceed with diagnostic procedure # 2.  Laboratory Chemistry  Inflammation Markers (CRP: Acute Phase) (ESR: Chronic Phase) Lab Results  Component Value Date   CRP 14.3 (H) 06/05/2017   ESRSEDRATE 9 06/05/2017                 Renal Function Markers Lab Results  Component Value Date   BUN 14 06/05/2017   CREATININE 0.68 06/05/2017   GFRAA 102 06/05/2017   GFRNONAA 89 06/05/2017                 Hepatic Function Markers Lab Results  Component Value Date  AST 17 06/05/2017   ALT 14 06/05/2017   ALBUMIN 3.6 06/05/2017   ALKPHOS 124 (H) 06/05/2017                 Electrolytes Lab Results  Component Value Date   NA 144 06/05/2017   K 4.0 06/05/2017   CL 104 06/05/2017   CALCIUM 9.0 06/05/2017   MG 1.7 06/05/2017                 Neuropathy Markers Lab Results  Component Value Date   VITAMINB12 282 06/05/2017                 Bone Pathology Markers Lab Results  Component Value Date   ALKPHOS 124 (H) 06/05/2017   25OHVITD1 28 (L) 06/05/2017   25OHVITD2 3.0 06/05/2017   25OHVITD3 25 06/05/2017   CALCIUM 9.0 06/05/2017                 Coagulation Parameters Lab Results  Component Value Date   INR 1.08 10/01/2016   LABPROT 14.0 10/01/2016   PLT 405 02/20/2017                 Cardiovascular Markers Lab Results  Component Value Date   HGB 11.5 (L) 02/20/2017   HCT 35.0 02/20/2017                 Note: Lab results reviewed.  Recent Diagnostic Imaging Review  Dg C-arm 1-60 Min-no Report  Result Date: 06/26/2017 Fluoroscopy was utilized by the requesting physician.  No radiographic interpretation.   Note: Imaging results reviewed.          Meds   Current Outpatient Prescriptions:  .  ALPRAZolam (XANAX) 1 MG tablet, Take 1 tablet (1 mg  total) by mouth 4 (four) times daily as needed., Disp: 30 tablet, Rfl: 0 .  buPROPion (WELLBUTRIN SR) 150 MG 12 hr tablet, Take 150 mg by mouth 2 (two) times daily. , Disp: , Rfl:  .  Cholecalciferol (VITAMIN D3) 5000 units TABS, Take 1 tablet (5,000 Units total) by mouth daily., Disp: 30 tablet, Rfl: 2 .  escitalopram (LEXAPRO) 10 MG tablet, Take 10 mg by mouth daily. , Disp: , Rfl:  .  Fluticasone-Salmeterol (ADVAIR) 100-50 MCG/DOSE AEPB, Inhale 1 puff into the lungs every 12 (twelve) hours., Disp: , Rfl:  .  Ipratropium-Albuterol (COMBIVENT RESPIMAT) 20-100 MCG/ACT AERS respimat, Inhale 1 puff into the lungs every 6 (six) hours as needed for wheezing or shortness of breath., Disp: , Rfl:  .  levothyroxine (SYNTHROID, LEVOTHROID) 88 MCG tablet, Take 88 mcg by mouth daily before breakfast. , Disp: , Rfl:  .  oxyCODONE (OXY IR/ROXICODONE) 5 MG immediate release tablet, Take 1 tablet (5 mg total) by mouth every 6 (six) hours as needed for severe pain., Disp: 120 tablet, Rfl: 0 .  traZODone (DESYREL) 150 MG tablet, Take 300 mg by mouth at bedtime. , Disp: , Rfl:  .  magnesium oxide (MAG-OX) 400 MG tablet, Take 1 tablet (400 mg total) by mouth 2 (two) times daily., Disp: 180 tablet, Rfl: 0 .  tizanidine (ZANAFLEX) 2 MG capsule, Take 1 capsule (2 mg total) by mouth 3 (three) times daily as needed for muscle spasms., Disp: 90 capsule, Rfl: 2  ROS  Constitutional: Denies any fever or chills Gastrointestinal: No reported hemesis, hematochezia, vomiting, or acute GI distress Musculoskeletal: Denies any acute onset joint swelling, redness, loss of ROM, or weakness Neurological: No reported episodes of acute onset apraxia, aphasia,  dysarthria, agnosia, amnesia, paralysis, loss of coordination, or loss of consciousness  Allergies  [name] has No Known Allergies.  [place]  Drug: [name]  reports that she does not use drugs. Alcohol:  reports that she does not drink alcohol. Tobacco:  reports that she has  been smoking Cigarettes.  She has been smoking about 0.50 packs per day. She has never used smokeless tobacco. Medical:  has a past medical history of Anxiety; CAD (coronary artery disease); Carpal tunnel syndrome; Complication of anesthesia; COPD (chronic obstructive pulmonary disease) ([place]); CRP elevated (09/14/2015); Depression; Difficult intubation; Dyslipidemia; Dyspnea; Dysrhythmia; Elevated sedimentation rate (09/14/2015); Esophageal spasm; Gastrointestinal parasites; GERD (gastroesophageal reflux disease); Hiatal hernia; History of peptic ulcer disease; Hyperthyroidism; Hypothyroidism; Low magnesium levels (09/14/2015); Pelvic fracture ([place]) (2008); Reflux; Rotator cuff injury; and Stenosis, spinal, lumbar. Surgical: [name]  has a past surgical history that includes Cesarean section; Neck surgery; Cholecystectomy; Carpal tunnel release; Rotator cuff repair; Shoulder arthroscopy (12/07/2011); Shoulder surgery (12/07/2011); Intramedullary (im) nail intertrochanteric (Right, 10/01/2016); Fracture surgery; Appendectomy; Nose surgery; Back surgery; and Cataract extraction w/PHACO (Right, 08/07/2017). Family: family history includes Aneurysm in her unknown relative.  Constitutional Exam  General appearance: Well nourished, well developed, and well hydrated. In no apparent acute distress Vitals:   08/01/17 1341  BP: (!) 144/64  Pulse: 93  Resp: 18  Temp: 98.4 F (36.9 C)  TempSrc: Oral  SpO2: 97%  Weight: 108 lb (49 kg)  Height: 5' 4"  (1.626 m)   BMI Assessment: Estimated body mass index is 18.54 kg/m as calculated from the following:   Height as of this encounter: 5' 4"  (1.626 m).   Weight as of this encounter: 108 lb (49 kg).  BMI interpretation table: BMI level Category Range association with higher incidence of chronic pain  <18 kg/m2 Underweight   18.5-24.9 kg/m2 Ideal body weight   25-29.9 kg/m2 Overweight Increased incidence by 20%  30-34.9 kg/m2 Obese (Class I) Increased  incidence by 68%  35-39.9 kg/m2 Severe obesity (Class II) Increased incidence by 136%  >40 kg/m2 Extreme obesity (Class III) Increased incidence by 254%   BMI Readings from Last 4 Encounters:  08/07/17 18.54 kg/m  08/01/17 18.54 kg/m  06/26/17 16.82 kg/m  06/25/17 18.52 kg/m   Wt Readings from Last 4 Encounters:  08/07/17 108 lb (49 kg)  08/01/17 108 lb (49 kg)  06/26/17 98 lb (44.5 kg)  06/25/17 98 lb (44.5 kg)  Psych/Mental status: Alert, oriented x 3 (person, place, & time)       Eyes: PERLA Respiratory: No evidence of acute respiratory distress  Cervical Spine Area Exam  Skin & Axial Inspection: No masses, redness, edema, swelling, or associated skin lesions Alignment: Symmetrical Functional ROM: Unrestricted ROM      Stability: No instability detected Muscle Tone/Strength: Functionally intact. No obvious neuro-muscular anomalies detected. Sensory (Neurological): Unimpaired Palpation: No palpable anomalies              Upper Extremity (UE) Exam    Side: Right upper extremity  Side: Left upper extremity  Skin & Extremity Inspection: Skin color, temperature, and hair growth are WNL. No peripheral edema or cyanosis. No masses, redness, swelling, asymmetry, or associated skin lesions. No contractures.  Skin & Extremity Inspection: Skin color, temperature, and hair growth are WNL. No peripheral edema or cyanosis. No masses, redness, swelling, asymmetry, or associated skin lesions. No contractures.  Functional ROM: Unrestricted ROM          Functional ROM: Unrestricted ROM  Muscle Tone/Strength: Functionally intact. No obvious neuro-muscular anomalies detected.  Muscle Tone/Strength: Functionally intact. No obvious neuro-muscular anomalies detected.  Sensory (Neurological): Unimpaired          Sensory (Neurological): Unimpaired          Palpation: No palpable anomalies              Palpation: No palpable anomalies              Specialized Test(s): Deferred          Specialized Test(s): Deferred          Thoracic Spine Area Exam  Skin & Axial Inspection: No masses, redness, or swelling Alignment: Symmetrical Functional ROM: Unrestricted ROM Stability: No instability detected Muscle Tone/Strength: Functionally intact. No obvious neuro-muscular anomalies detected. Sensory (Neurological): Unimpaired Muscle strength & Tone: No palpable anomalies  Lumbar Spine Area Exam  Skin & Axial Inspection: No masses, redness, or swelling Alignment: Symmetrical Functional ROM: Decreased ROM      Stability: No instability detected Muscle Tone/Strength: Functionally intact. No obvious neuro-muscular anomalies detected. Sensory (Neurological): Movement-associated pain Palpation: Complains of area being tender to palpation       Provocative Tests: Lumbar Hyperextension and rotation test: Positive on the right for facet joint pain. Lumbar Lateral bending test: evaluation deferred today       Patrick's Maneuver: Positive for right-sided S-I arthralgia              Gait & Posture Assessment  Ambulation: Unassisted Gait: Relatively normal for age and body habitus Posture: WNL   Lower Extremity Exam    Side: Right lower extremity  Side: Left lower extremity  Skin & Extremity Inspection: Skin color, temperature, and hair growth are WNL. No peripheral edema or cyanosis. No masses, redness, swelling, asymmetry, or associated skin lesions. No contractures.  Skin & Extremity Inspection: Skin color, temperature, and hair growth are WNL. No peripheral edema or cyanosis. No masses, redness, swelling, asymmetry, or associated skin lesions. No contractures.  Functional ROM: Unrestricted ROM          Functional ROM: Unrestricted ROM          Muscle Tone/Strength: Functionally intact. No obvious neuro-muscular anomalies detected.  Muscle Tone/Strength: Functionally intact. No obvious neuro-muscular anomalies detected.  Sensory (Neurological): Unimpaired  Sensory (Neurological):  Unimpaired  Palpation: No palpable anomalies  Palpation: No palpable anomalies   Assessment  Primary Diagnosis & Pertinent Problem List: The primary encounter diagnosis was Lumbar facet syndrome (Location of Primary Source of Pain) (Bilateral) (R>L). Diagnoses of Chronic low back pain (Location of Primary Source of Pain) (Bilateral) (R>L), Lumbar facet hypertrophy (Bilateral), Chronic sacroiliac joint pain (Right), Lumbar spondylosis, and Chronic pain syndrome were also pertinent to this visit.  Status Diagnosis  Persistent Persistent Stable 1. Lumbar facet syndrome (Location of Primary Source of Pain) (Bilateral) (R>L)   2. Chronic low back pain (Location of Primary Source of Pain) (Bilateral) (R>L)   3. Lumbar facet hypertrophy (Bilateral)   4. Chronic sacroiliac joint pain (Right)   5. Lumbar spondylosis   6. 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 oxyCODONE (OXY IR/ROXICODONE) 5 MG immediate release tablet    Sig: Take 1 tablet (5 mg total) by mouth every 6 (six) hours as needed for severe pain.    Dispense:  120 tablet    Refill:  0    Do not place this medication, or any other prescription from our practice, on "Automatic Refill". Patient may have prescription filled one day early if pharmacy is  closed on scheduled refill date. Do not fill until: 08/01/17 To last until: 08/31/17   Medications administered today: [name] had no medications administered during this visit.   Procedure Orders     LUMBAR FACET(MEDIAL BRANCH NERVE BLOCK) MBNB     SACROILIAC JOINT INJECTINS Lab Orders  No laboratory test(s) ordered today   Imaging Orders  No imaging studies ordered today   Referral Orders  No referral(s) requested today    Interventional management options: Planned, scheduled, and/or pending:   Not at this time.   Considering:   Diagnostic right-sidedlumbarfacet + right-sided sacroiliac joint block#2 Diagnostic right-sided sacroiliac joint block  Possible right sided sacroiliac joint RFA  Diagnostic right-sided lumbar facet block  Possible right-sided lumbar facet RFA   Diagnostic bilateral cervical facetblock  Possible bilateral cervical facet radiofrequencyablation  Diagnostic right-sided cervical epidural steroid injection  Diagnostic bilateral intra-articular shoulderjoint injection  Diagnostic bilateral suprascapularnerve block  Possible bilateral suprascapular nerve radiofrequencyablation  Diagnostic left-sided L3-4 transforaminal epiduralsteroid injection  Diagnostic L3-4 versus L4-5 lumbar epiduralsteroid injection    Palliative PRN treatment(s):   Palliative right-sided sacroiliac joint block Palliative right-sided lumbar facet block   Provider-requested follow-up: Return in about 1 month (around 08/31/2017) for Med-Mgmt by Dr. Dossie Arbour.  Future Appointments Date Time Provider [place]  08/29/2017 1:45 PM Milinda Pointer, MD [facility] None   Primary Care Physician: Albina Billet, MD Location: [facility] Note by: Gaspar Cola, MD Date: 08/01/2017; Time: 3:13 PM

## 2017-08-01 NOTE — Patient Instructions (Signed)
Sacroiliac (SI) Joint Injection Patient Information  Description: The sacroiliac joint connects the scrum (very low back and tailbone) to the ilium (a pelvic bone which also forms half of the hip joint).  Normally this joint experiences very little motion.  When this joint becomes inflamed or unstable low back and or hip and pelvis pain may result.  Injection of this joint with local anesthetics (numbing medicines) and steroids can provide diagnostic information and reduce pain.  This injection is performed with the aid of x-ray guidance into the tailbone area while you are lying on your stomach.   You may experience an electrical sensation down the leg while this is being done.  You may also experience numbness.  We also may ask if we are reproducing your normal pain during the injection.  Conditions which may be treated SI injection:   Low back, buttock, hip or leg pain  Preparation for the Injection:  1. Do not eat any solid food or dairy products within 8 hours of your appointment.  2. You may drink clear liquids up to 3 hours before appointment.  Clear liquids include water, black coffee, juice or soda.  No milk or cream please. 3. You may take your regular medications, including pain medications with a sip of water before your appointment.  Diabetics should hold regular insulin (if take separately) and take 1/2 normal NPH dose the morning of the procedure.  Carry some sugar containing items with you to your appointment. 4. A driver must accompany you and be prepared to drive you home after your procedure. 5. Bring all of your current medications with you. 6. An IV may be inserted and sedation may be given at the discretion of the physician. 7. A blood pressure cuff, EKG and other monitors will often be applied during the procedure.  Some patients may need to have extra oxygen administered for a short period.  8. You will be asked to provide medical information, including your allergies,  prior to the procedure.  We must know immediately if you are taking blood thinners (like Coumadin/Warfarin) or if you are allergic to IV iodine contrast (dye).  We must know if you could possible be pregnant.  Possible side effects:   Bleeding from needle site  Infection (rare, may require surgery)  Nerve injury (rare)  Numbness & tingling (temporary)  A brief convulsion or seizure  Light-headedness (temporary)  Pain at injection site (several days)  Decreased blood pressure (temporary)  Weakness in the leg (temporary)   Call if you experience:   New onset weakness or numbness of an extremity below the injection site that last more than 8 hours.  Hives or difficulty breathing ( go to the emergency room)  Inflammation or drainage at the injection site  Any new symptoms which are concerning to you  Please note:  Although the local anesthetic injected can often make your back/ hip/ buttock/ leg feel good for several hours after the injections, the pain will likely return.  It takes 3-7 days for steroids to work in the sacroiliac area.  You may not notice any pain relief for at least that one week.  If effective, we will often do a series of three injections spaced 3-6 weeks apart to maximally decrease your pain.  After the initial series, we generally will wait some months before a repeat injection of the same type.  If you have any questions, please call [phone] [place]  The facet joints connect the bones of the spine (vertebrae). They make it possible for you to bend, twist, and make other movements with your spine. They also keep you from bending too far, twisting too far, and making other excessive movements. A facet joint block is a procedure where a numbing medicine (anesthetic) is injected into a facet joint. Often, a type of anti-inflammatory medicine called a steroid is also injected. A facet joint block  may be done to diagnose neck or back pain. If the pain gets better after a facet joint block, it means the pain is probably coming from the facet joint. If the pain does not get better, it means the pain is probably not coming from the facet joint. A facet joint block may also be done to relieve neck or back pain caused by an inflamed facet joint. A facet joint block is only done to relieve pain if the pain does not improve with other methods, such as medicine, exercise programs, and physical therapy. Tell a health care provider about:  Any allergies you have.  All medicines you are taking, including vitamins, herbs, eye drops, creams, and over-the-counter medicines.  Any problems you or family members have had with anesthetic medicines.  Any blood disorders you have.  Any surgeries you have had.  Any medical conditions you have.  Whether you are pregnant or may be pregnant. What are the risks? Generally, this is a safe procedure. However, problems may occur, including:  Bleeding.  Injury to a nerve near the injection site.  Pain at the injection site.  Weakness or numbness in areas controlled by nerves near the injection site.  Infection.  Temporary fluid retention.  Allergic reactions to medicines or dyes.  Injury to other structures or organs near the injection site.  What happens before the procedure?  Follow instructions from your health care provider about eating or drinking restrictions.  Ask your health care provider about: ? Changing or stopping your regular medicines. This is especially important if you are taking diabetes medicines or blood thinners. ? Taking medicines such as aspirin and ibuprofen. These medicines can thin your blood. Do not take these medicines before your procedure if your health care provider instructs you not to.  Do not take any new dietary supplements or medicines without asking your health care provider first.  Plan to have someone take  you home after the procedure. What happens during the procedure?  You may need to remove your clothing and dress in an open-back gown.  The procedure will be done while you are lying on an X-ray table. You will most likely be asked to lie on your stomach, but you may be asked to lie in a different position if an injection will be made in your neck.  Machines will be used to monitor your oxygen levels, heart rate, and blood pressure.  If an injection will be made in your neck, an IV tube will be inserted into one of your veins. Fluids and medicine will flow directly into your body through the IV tube.  The area over the facet joint where the injection will be made will be cleaned with soap. The surrounding skin will be covered with clean drapes.  A numbing medicine (local anesthetic) will be applied to your skin. Your skin may sting or burn for a moment.  A video X-ray machine (fluoroscopy) will be used to locate the joint. In some cases, a CT scan may be used.  A contrast dye  may be injected into the facet joint area to help locate the joint.  When the joint is located, an anesthetic will be injected into the joint through the needle.  Your health care provider will ask you whether you feel pain relief. If you do feel relief, a steroid may be injected to provide pain relief for a longer period of time. If you do not feel relief or feel only partial relief, additional injections of an anesthetic may be made in other facet joints.  The needle will be removed.  Your skin will be cleaned.  A bandage (dressing) will be applied over each injection site. The procedure may vary among health care providers and hospitals. What happens after the procedure?  You will be observed for 15-30 minutes before being allowed to go home. This information is not intended to replace advice given to you by your health care provider. Make sure you discuss any questions you have with your health care  provider. Document Released: 03/28/2007 Document Revised: 12/08/2015 Document Reviewed: 08/02/2015 Elsevier Interactive Patient Education  [employer].

## 2017-08-01 NOTE — Progress Notes (Signed)
Results were reviewed and found to be: significantly abnormal  Subacute injury or pathology identified  Pt being followed closely by provider

## 2017-08-01 NOTE — Progress Notes (Signed)
Results were reviewed and found to be: significantly abnormal  Further testing may be useful  Review would suggest interventional pain management techniques may be of benefit

## 2017-08-01 NOTE — Progress Notes (Signed)
Safety precautions to be maintained throughout the outpatient stay will include: orient to surroundings, keep bed in low position, maintain call bell within reach at all times, provide assistance with transfer out of bed and ambulation.  

## 2017-08-02 NOTE — Progress Notes (Signed)
  UNLISTED NOTE: This forensic urine drug screen (UDS) test was conducted using a state-of-the-art ultra high performance liquid chromatography and mass spectrometry system (UPLC/MS-MS), the most sophisticated and accurate method available. UPLC/MS-MS is 1,000 times more precise and accurate than standard gas chromatography and mass spectrometry (GC/MS). This system can analyze 26 drug categories and 180 drug compounds.  Results reviewed. Anomalies determined to be benign. Unexpected results determined to be associated to miscommunication during the recording of medications taken within the last 72 hours.  ____________________________________________________________________________________________ ADDITIONAL UNLISTED OPIOIDS NOTE: This forensic urine drug screen (UDS) test was conducted using a state-of-the-art ultra high performance liquid chromatography and mass spectrometry system (UPLC/MS-MS), the most sophisticated and accurate method available. UPLC/MS-MS is 1,000 times more precise and accurate than standard gas chromatography and mass spectrometry (GC/MS). This system can analyze 26 drug categories and 180 drug compounds.  The findings of this UDT were reported as abnormal due to inconsistencies with expected results. An additional unreported opioid was identified in the sample. Expectations were based on the prescribed medication(s). Results are of concern due to the following possibilities:   1. The use of multiple providers, suggesting the illegal practice of "Doctor Shopping", in violation of [place] Statutes, as well as our medication agreement.   2. The use of unreported sources of medication, possibly illegal, in violation of State and [employer], in addition to non-compliance with our medication agreement.   __

## 2017-08-07 ENCOUNTER — Ambulatory Visit
Admission: RE | Admit: 2017-08-07 | Discharge: 2017-08-07 | Disposition: A | Discharge status: Home / Self Care | Payer: Medicare HMO | Source: Ambulatory Visit | Attending: Ophthalmology | Admitting: Ophthalmology

## 2017-08-07 ENCOUNTER — Encounter: Payer: Self-pay | Admitting: *Deleted

## 2017-08-07 ENCOUNTER — Ambulatory Visit: Payer: Medicare HMO | Admitting: Certified Registered Nurse Anesthetist

## 2017-08-07 ENCOUNTER — Encounter
Admission: RE | Disposition: A | Discharge status: Home / Self Care | Payer: Self-pay | Source: Ambulatory Visit | Attending: Ophthalmology

## 2017-08-07 DIAGNOSIS — K219 Gastro-esophageal reflux disease without esophagitis: Secondary | ICD-10-CM | POA: Diagnosis not present

## 2017-08-07 DIAGNOSIS — I10 Essential (primary) hypertension: Secondary | ICD-10-CM | POA: Insufficient documentation

## 2017-08-07 DIAGNOSIS — J449 Chronic obstructive pulmonary disease, unspecified: Secondary | ICD-10-CM | POA: Diagnosis not present

## 2017-08-07 DIAGNOSIS — E119 Type 2 diabetes mellitus without complications: Secondary | ICD-10-CM | POA: Diagnosis not present

## 2017-08-07 DIAGNOSIS — M1990 Unspecified osteoarthritis, unspecified site: Secondary | ICD-10-CM | POA: Insufficient documentation

## 2017-08-07 DIAGNOSIS — R69 Illness, unspecified: Secondary | ICD-10-CM | POA: Diagnosis not present

## 2017-08-07 DIAGNOSIS — E039 Hypothyroidism, unspecified: Secondary | ICD-10-CM | POA: Diagnosis not present

## 2017-08-07 DIAGNOSIS — F17200 Nicotine dependence, unspecified, uncomplicated: Secondary | ICD-10-CM | POA: Insufficient documentation

## 2017-08-07 DIAGNOSIS — K449 Diaphragmatic hernia without obstruction or gangrene: Secondary | ICD-10-CM | POA: Insufficient documentation

## 2017-08-07 DIAGNOSIS — Z981 Arthrodesis status: Secondary | ICD-10-CM | POA: Diagnosis not present

## 2017-08-07 DIAGNOSIS — Z9049 Acquired absence of other specified parts of digestive tract: Secondary | ICD-10-CM | POA: Insufficient documentation

## 2017-08-07 DIAGNOSIS — Z7951 Long term (current) use of inhaled steroids: Secondary | ICD-10-CM | POA: Diagnosis not present

## 2017-08-07 DIAGNOSIS — Z79899 Other long term (current) drug therapy: Secondary | ICD-10-CM | POA: Insufficient documentation

## 2017-08-07 DIAGNOSIS — Z9889 Other specified postprocedural states: Secondary | ICD-10-CM | POA: Diagnosis not present

## 2017-08-07 DIAGNOSIS — H2511 Age-related nuclear cataract, right eye: Secondary | ICD-10-CM | POA: Insufficient documentation

## 2017-08-07 DIAGNOSIS — E0590 Thyrotoxicosis, unspecified without thyrotoxic crisis or storm: Secondary | ICD-10-CM | POA: Insufficient documentation

## 2017-08-07 DIAGNOSIS — I2510 Atherosclerotic heart disease of native coronary artery without angina pectoris: Secondary | ICD-10-CM | POA: Diagnosis not present

## 2017-08-07 HISTORY — PX: CATARACT EXTRACTION W/PHACO: SHX586

## 2017-08-07 HISTORY — DX: Spinal stenosis, lumbar region without neurogenic claudication: M48.061

## 2017-08-07 HISTORY — DX: Dyspnea, unspecified: R06.00

## 2017-08-07 HISTORY — DX: Gastro-esophageal reflux disease without esophagitis: K21.9

## 2017-08-07 SURGERY — PHACOEMULSIFICATION, CATARACT, WITH IOL INSERTION
Anesthesia: Monitor Anesthesia Care | Site: Eye | Laterality: Right | Wound class: Clean

## 2017-08-07 MED ORDER — POVIDONE-IODINE 5 % OP SOLN
OPHTHALMIC | Status: AC
  Filled 2017-08-07: qty 30

## 2017-08-07 MED ORDER — SODIUM CHLORIDE 0.9 % IV SOLN
INTRAVENOUS | Status: DC
  Administered 2017-08-07: 11:00:00 via INTRAVENOUS

## 2017-08-07 MED ORDER — MOXIFLOXACIN HCL 0.5 % OP SOLN
OPHTHALMIC | Status: AC
  Filled 2017-08-07: qty 3

## 2017-08-07 MED ORDER — MOXIFLOXACIN HCL 0.5 % OP SOLN: 1.0000 [drp] | OPHTHALMIC | Status: DC | PRN

## 2017-08-07 MED ORDER — MIDAZOLAM HCL 2 MG/2ML IJ SOLN
INTRAMUSCULAR | Status: DC | PRN
  Administered 2017-08-07: 0.5 mg via INTRAVENOUS
  Administered 2017-08-07: 1 mg via INTRAVENOUS

## 2017-08-07 MED ORDER — NA CHONDROIT SULF-NA HYALURON 40-17 MG/ML IO SOLN
INTRAOCULAR | Status: AC
  Filled 2017-08-07: qty 1

## 2017-08-07 MED ORDER — MOXIFLOXACIN HCL 0.5 % OP SOLN
OPHTHALMIC | Status: DC | PRN
Start: 2017-08-07 — End: 2017-08-07
  Administered 2017-08-07: 0.2 mL via OPHTHALMIC

## 2017-08-07 MED ORDER — POVIDONE-IODINE 5 % OP SOLN
OPHTHALMIC | Status: DC | PRN
  Administered 2017-08-07: 1 via OPHTHALMIC

## 2017-08-07 MED ORDER — NA CHONDROIT SULF-NA HYALURON 40-17 MG/ML IO SOLN
INTRAOCULAR | Status: DC | PRN
  Administered 2017-08-07: 1 mL via INTRAOCULAR

## 2017-08-07 MED ORDER — LIDOCAINE HCL (PF) 4 % IJ SOLN
INTRAMUSCULAR | Status: AC
  Filled 2017-08-07: qty 5

## 2017-08-07 MED ORDER — LIDOCAINE HCL (PF) 4 % IJ SOLN
INTRAMUSCULAR | Status: DC | PRN
  Administered 2017-08-07: 4 mL via OPHTHALMIC

## 2017-08-07 MED ORDER — EPINEPHRINE PF 1 MG/ML IJ SOLN
INTRAMUSCULAR | Status: DC | PRN
  Administered 2017-08-07: 12:00:00 via OPHTHALMIC

## 2017-08-07 MED ORDER — FENTANYL CITRATE (PF) 100 MCG/2ML IJ SOLN
INTRAMUSCULAR | Status: AC
  Filled 2017-08-07: qty 2

## 2017-08-07 MED ORDER — EPINEPHRINE PF 1 MG/ML IJ SOLN
INTRAMUSCULAR | Status: AC
  Filled 2017-08-07: qty 2

## 2017-08-07 MED ORDER — ARMC OPHTHALMIC DILATING DROPS
OPHTHALMIC | Status: AC
  Administered 2017-08-07: 1 via OPHTHALMIC
  Filled 2017-08-07: qty 0.4

## 2017-08-07 MED ORDER — MIDAZOLAM HCL 2 MG/2ML IJ SOLN
INTRAMUSCULAR | Status: AC
  Filled 2017-08-07: qty 2

## 2017-08-07 MED ORDER — FENTANYL CITRATE (PF) 100 MCG/2ML IJ SOLN
INTRAMUSCULAR | Status: DC | PRN
  Administered 2017-08-07: 25 ug via INTRAVENOUS
  Administered 2017-08-07: 50 ug via INTRAVENOUS

## 2017-08-07 MED ORDER — ARMC OPHTHALMIC DILATING DROPS
1.0000 "application " | OPHTHALMIC | Status: AC
  Administered 2017-08-07 (×3): 1 via OPHTHALMIC

## 2017-08-07 MED ORDER — CARBACHOL 0.01 % IO SOLN
INTRAOCULAR | Status: DC | PRN
  Administered 2017-08-07: 0.5 mL via INTRAOCULAR

## 2017-08-07 SURGICAL SUPPLY — 17 items
GLOVE BIO SURGEON STRL SZ8 (GLOVE) ×2 IMPLANT
GLOVE BIOGEL M 6.5 STRL (GLOVE) ×2 IMPLANT
GLOVE SURG LX 8.0 MICRO (GLOVE) ×1
GLOVE SURG LX STRL 8.0 MICRO (GLOVE) ×1 IMPLANT
GOWN STRL REUS W/ TWL LRG LVL3 (GOWN DISPOSABLE) ×2 IMPLANT
GOWN STRL REUS W/TWL LRG LVL3 (GOWN DISPOSABLE) ×4
LABEL CATARACT MEDS ST (LABEL) ×2 IMPLANT
LENS IOL TECNIS ITEC 20.5 (Intraocular Lens) ×1 IMPLANT
PACK CATARACT (MISCELLANEOUS) ×2 IMPLANT
PACK CATARACT BRASINGTON LX (MISCELLANEOUS) ×2 IMPLANT
PACK EYE AFTER SURG (MISCELLANEOUS) ×2 IMPLANT
RING MALYGIN 7.0 (MISCELLANEOUS) ×1 IMPLANT
SOL BSS BAG (MISCELLANEOUS) ×2
SOLUTION BSS BAG (MISCELLANEOUS) ×1 IMPLANT
SYR 5ML LL (SYRINGE) ×2 IMPLANT
WATER STERILE IRR 250ML POUR (IV SOLUTION) ×2 IMPLANT
WIPE NON LINTING 3.25X3.25 (MISCELLANEOUS) ×2 IMPLANT

## 2017-08-07 NOTE — Anesthesia Postprocedure Evaluation (Signed)
Anesthesia Post Note  Patient: [place]  Procedure(s) Performed: Procedure(s) (LRB): CATARACT EXTRACTION PHACO AND INTRAOCULAR LENS PLACEMENT (IOC) (Right)  Patient location during evaluation: PACU Anesthesia Type: MAC Level of consciousness: awake and alert Pain management: pain level controlled Vital Signs Assessment: post-procedure vital signs reviewed and stable Respiratory status: spontaneous breathing, nonlabored ventilation, respiratory function stable and patient connected to nasal cannula oxygen Cardiovascular status: stable and blood pressure returned to baseline Postop Assessment: no apparent nausea or vomiting Anesthetic complications: no     Last Vitals:  Vitals:   08/07/17 1059 08/07/17 1231  BP: 123/73 113/65  Pulse: (!) 106 97  Resp: 20 16  Temp: 36.9 C 36.9 C  SpO2: 96% 99%    Last Pain:  Vitals:   08/07/17 1231  TempSrc: Oral  PainSc: 0-No pain                 Darlyne Russian

## 2017-08-07 NOTE — Discharge Instructions (Signed)
Follow Dr. Inda Coke eye drop discharge instruction sheet as reviewed.  Eye Surgery Discharge Instructions  Expect mild scratchy sensation or mild soreness. DO NOT RUB YOUR EYE!  The day of surgery:  Minimal physical activity, but bed rest is not required  No reading, computer work, or close hand work  No bending, lifting, or straining.  May watch TV  For 24 hours:  No driving, legal decisions, or alcoholic beverages  Safety precautions  Eat anything you prefer: It is better to start with liquids, then soup then solid foods.  _____ Eye patch should be worn until postoperative exam tomorrow.  ____ Solar shield eyeglasses should be worn for comfort in the sunlight/patch while sleeping  Resume all regular medications including aspirin or Coumadin if these were discontinued prior to surgery. You may shower, bathe, shave, or wash your hair. Tylenol may be taken for mild discomfort.  Call your doctor if you experience significant pain, nausea, or vomiting, fever > 101 or other signs of infection. [phone] or [phone] Specific instructions:  Follow-up Information    Birder Robson, MD Follow up.   Specialty:  Ophthalmology Why:  TODAY 08-07-17 @ 2:50 PM Contact information: [address] [place] [address] [phone]

## 2017-08-07 NOTE — Anesthesia Post-op Follow-up Note (Signed)
Anesthesia QCDR form completed.        

## 2017-08-07 NOTE — Anesthesia Procedure Notes (Signed)
Procedure Name: MAC Date/Time: 08/07/2017 12:03 PM Performed by: Darlyne Russian Pre-anesthesia Checklist: Patient identified, Emergency Drugs available, Suction available, Patient being monitored and Timeout performed Oxygen Delivery Method: Nasal cannula Placement Confirmation: positive ETCO2

## 2017-08-07 NOTE — Op Note (Signed)
PREOPERATIVE DIAGNOSIS:  Nuclear sclerotic cataract of the right eye.   POSTOPERATIVE DIAGNOSIS:  NUCLEAR SCLEROTIC CATARACT RIGHT EYE   OPERATIVE PROCEDURE: Procedure(s): CATARACT EXTRACTION PHACO AND INTRAOCULAR LENS PLACEMENT (IOC)   SURGEON:  Birder Robson, MD.   ANESTHESIA:  Anesthesiologist: Molli Barrows, MD CRNA: Darlyne Russian, CRNA  1.      Managed anesthesia care. 2.      0.80ml of Shugarcaine was instilled in the eye following the paracentesis.   COMPLICATIONS:  None.   TECHNIQUE:   Stop and chop   DESCRIPTION OF PROCEDURE:  The patient was examined and consented in the preoperative holding area where the aforementioned topical anesthesia was applied to the right eye and then brought back to the Operating Room where the right eye was prepped and draped in the usual sterile ophthalmic fashion and a lid speculum was placed. A paracentesis was created with the side port blade and the anterior chamber was filled with viscoelastic. A near clear corneal incision was performed with the steel keratome. A continuous curvilinear capsulorrhexis was performed with a cystotome followed by the capsulorrhexis forceps. Hydrodissection and hydrodelineation were carried out with BSS on a blunt cannula. The lens was removed in a stop and chop  technique and the remaining cortical material was removed with the irrigation-aspiration handpiece. The capsular bag was inflated with viscoelastic and the Technis [id number]  lens was placed in the capsular bag without complication. The remaining viscoelastic was removed from the eye with the irrigation-aspiration handpiece. The wounds were hydrated. The anterior chamber was flushed with Miostat and the eye was inflated to physiologic pressure. 0.14ml of Vigamox was placed in the anterior chamber. The wounds were found to be water tight. The eye was dressed with Vigamox. The patient was given protective glasses to wear throughout the day and a shield with which to  sleep tonight. The patient was also given drops with which to begin a drop regimen today and will follow-up with me in one day.  Implant Name Type Inv. Item Serial No. Manufacturer Lot No. LRB No. Used  LENS IOL DIOP 20.5 - [id number] Intraocular Lens LENS IOL DIOP 20.5 [id number] AMO   Right 1   Procedure(s) with comments: CATARACT EXTRACTION PHACO AND INTRAOCULAR LENS PLACEMENT (IOC) (Right) - [place] 00:52.0 AP% 16.8 CDE 8.74 Fluid Pack Lot # [id number]  Electronically signed: Tim Lair 08/07/2017 12:29 PM

## 2017-08-07 NOTE — Anesthesia Preprocedure Evaluation (Signed)
Anesthesia Evaluation  Patient identified by MRN, date of birth, ID band Patient awake    Reviewed: Allergy & Precautions, H&P , NPO status , Patient's Chart, lab work & pertinent test results, reviewed documented beta blocker date and time   History of Anesthesia Complications (+) DIFFICULT AIRWAY and history of anesthetic complications  Airway Mallampati: II  TM Distance: >3 FB Neck ROM: full    Dental no notable dental hx. (+) Teeth Intact   Pulmonary neg pulmonary ROS, shortness of breath and with exertion, COPD, Current Smoker,    Pulmonary exam normal breath sounds clear to auscultation       Cardiovascular Exercise Tolerance: Good hypertension, + CAD  negative cardio ROS  + dysrhythmias  Rhythm:regular Rate:Normal     Neuro/Psych PSYCHIATRIC DISORDERS  Neuromuscular disease negative neurological ROS  negative psych ROS   GI/Hepatic negative GI ROS, Neg liver ROS, hiatal hernia, GERD  Medicated,  Endo/Other  negative endocrine ROSdiabetesHypothyroidism Hyperthyroidism   Renal/GU      Musculoskeletal   Abdominal   Peds  Hematology negative hematology ROS (+)   Anesthesia Other Findings   Reproductive/Obstetrics negative OB ROS                             Anesthesia Physical Anesthesia Plan  ASA: IV  Anesthesia Plan: MAC   Post-op Pain Management:    Induction:   PONV Risk Score and Plan:   Airway Management Planned:   Additional Equipment:   Intra-op Plan:   Post-operative Plan:   Informed Consent: I have reviewed the patients History and Physical, chart, labs and discussed the procedure including the risks, benefits and alternatives for the proposed anesthesia with the patient or authorized representative who has indicated his/her understanding and acceptance.     Plan Discussed with: CRNA  Anesthesia Plan Comments:         Anesthesia Quick  Evaluation

## 2017-08-07 NOTE — Transfer of Care (Signed)
Immediate Anesthesia Transfer of Care Note  Patient: [place]  Procedure(s) Performed: Procedure(s) with comments: CATARACT EXTRACTION PHACO AND INTRAOCULAR LENS PLACEMENT (IOC) (Right) - [place] 00:52.0 AP% 16.8 CDE 8.74 Fluid Pack Lot # [id number]  Patient Location: PACU  Anesthesia Type:MAC  Level of Consciousness: awake, alert  and oriented  Airway & Oxygen Therapy: Patient Spontanous Breathing  Post-op Assessment: Report given to RN and Post -op Vital signs reviewed and stable  Post vital signs: Reviewed and stable  Last Vitals:  Vitals:   08/07/17 1059 08/07/17 1231  BP: 123/73 113/65  Pulse: (!) 106 96  Resp: 20 16  Temp: 36.9 C   SpO2: 96% 98%    Last Pain:  Vitals:   08/07/17 1231  TempSrc: Oral  PainSc:          Complications: No apparent anesthesia complications

## 2017-08-07 NOTE — H&P (Signed)
All labs reviewed. Abnormal studies sent to patients PCP when indicated.  Previous H&P reviewed, patient examined, there are NO CHANGES.  [name] LOUIS9/18/201811:57 AM

## 2017-08-16 ENCOUNTER — Ambulatory Visit: Payer: Medicare HMO | Admitting: Pain Medicine

## 2017-08-27 DIAGNOSIS — H2512 Age-related nuclear cataract, left eye: Secondary | ICD-10-CM | POA: Diagnosis not present

## 2017-08-29 ENCOUNTER — Encounter: Payer: Self-pay | Admitting: Pain Medicine

## 2017-08-29 ENCOUNTER — Ambulatory Visit: Payer: Medicare HMO | Attending: Pain Medicine | Admitting: Pain Medicine

## 2017-08-29 ENCOUNTER — Encounter: Payer: Self-pay | Admitting: *Deleted

## 2017-08-29 VITALS — BP 138/85 | HR 116 | Temp 98.3°F | Resp 16 | Ht 64.0 in | Wt 108.0 lb

## 2017-08-29 DIAGNOSIS — F17210 Nicotine dependence, cigarettes, uncomplicated: Secondary | ICD-10-CM | POA: Diagnosis not present

## 2017-08-29 DIAGNOSIS — M47816 Spondylosis without myelopathy or radiculopathy, lumbar region: Secondary | ICD-10-CM

## 2017-08-29 DIAGNOSIS — R700 Elevated erythrocyte sedimentation rate: Secondary | ICD-10-CM | POA: Insufficient documentation

## 2017-08-29 DIAGNOSIS — G894 Chronic pain syndrome: Secondary | ICD-10-CM | POA: Diagnosis not present

## 2017-08-29 DIAGNOSIS — J449 Chronic obstructive pulmonary disease, unspecified: Secondary | ICD-10-CM | POA: Diagnosis not present

## 2017-08-29 DIAGNOSIS — X58XXXS Exposure to other specified factors, sequela: Secondary | ICD-10-CM | POA: Insufficient documentation

## 2017-08-29 DIAGNOSIS — M533 Sacrococcygeal disorders, not elsewhere classified: Secondary | ICD-10-CM | POA: Diagnosis not present

## 2017-08-29 DIAGNOSIS — Z872 Personal history of diseases of the skin and subcutaneous tissue: Secondary | ICD-10-CM | POA: Diagnosis not present

## 2017-08-29 DIAGNOSIS — M47896 Other spondylosis, lumbar region: Secondary | ICD-10-CM

## 2017-08-29 DIAGNOSIS — Z961 Presence of intraocular lens: Secondary | ICD-10-CM | POA: Insufficient documentation

## 2017-08-29 DIAGNOSIS — M48061 Spinal stenosis, lumbar region without neurogenic claudication: Secondary | ICD-10-CM | POA: Diagnosis not present

## 2017-08-29 DIAGNOSIS — Z79891 Long term (current) use of opiate analgesic: Secondary | ICD-10-CM | POA: Insufficient documentation

## 2017-08-29 DIAGNOSIS — E559 Vitamin D deficiency, unspecified: Secondary | ICD-10-CM | POA: Diagnosis not present

## 2017-08-29 DIAGNOSIS — Z5181 Encounter for therapeutic drug level monitoring: Secondary | ICD-10-CM | POA: Insufficient documentation

## 2017-08-29 DIAGNOSIS — M792 Neuralgia and neuritis, unspecified: Secondary | ICD-10-CM | POA: Diagnosis not present

## 2017-08-29 DIAGNOSIS — E538 Deficiency of other specified B group vitamins: Secondary | ICD-10-CM | POA: Insufficient documentation

## 2017-08-29 DIAGNOSIS — F329 Major depressive disorder, single episode, unspecified: Secondary | ICD-10-CM | POA: Insufficient documentation

## 2017-08-29 DIAGNOSIS — M47812 Spondylosis without myelopathy or radiculopathy, cervical region: Secondary | ICD-10-CM | POA: Insufficient documentation

## 2017-08-29 DIAGNOSIS — Z79899 Other long term (current) drug therapy: Secondary | ICD-10-CM | POA: Diagnosis not present

## 2017-08-29 DIAGNOSIS — Z8249 Family history of ischemic heart disease and other diseases of the circulatory system: Secondary | ICD-10-CM | POA: Diagnosis not present

## 2017-08-29 DIAGNOSIS — Z8711 Personal history of peptic ulcer disease: Secondary | ICD-10-CM | POA: Diagnosis not present

## 2017-08-29 DIAGNOSIS — Z9841 Cataract extraction status, right eye: Secondary | ICD-10-CM | POA: Insufficient documentation

## 2017-08-29 DIAGNOSIS — M545 Low back pain, unspecified: Secondary | ICD-10-CM | POA: Insufficient documentation

## 2017-08-29 DIAGNOSIS — E039 Hypothyroidism, unspecified: Secondary | ICD-10-CM | POA: Diagnosis not present

## 2017-08-29 DIAGNOSIS — M488X2 Other specified spondylopathies, cervical region: Secondary | ICD-10-CM | POA: Insufficient documentation

## 2017-08-29 DIAGNOSIS — Z7951 Long term (current) use of inhaled steroids: Secondary | ICD-10-CM | POA: Diagnosis not present

## 2017-08-29 DIAGNOSIS — M488X6 Other specified spondylopathies, lumbar region: Secondary | ICD-10-CM | POA: Insufficient documentation

## 2017-08-29 DIAGNOSIS — G8929 Other chronic pain: Secondary | ICD-10-CM | POA: Diagnosis not present

## 2017-08-29 DIAGNOSIS — M961 Postlaminectomy syndrome, not elsewhere classified: Secondary | ICD-10-CM | POA: Insufficient documentation

## 2017-08-29 DIAGNOSIS — M542 Cervicalgia: Secondary | ICD-10-CM | POA: Insufficient documentation

## 2017-08-29 DIAGNOSIS — K224 Dyskinesia of esophagus: Secondary | ICD-10-CM | POA: Diagnosis not present

## 2017-08-29 DIAGNOSIS — Z86018 Personal history of other benign neoplasm: Secondary | ICD-10-CM | POA: Diagnosis not present

## 2017-08-29 DIAGNOSIS — L218 Other seborrheic dermatitis: Secondary | ICD-10-CM | POA: Diagnosis not present

## 2017-08-29 DIAGNOSIS — I2510 Atherosclerotic heart disease of native coronary artery without angina pectoris: Secondary | ICD-10-CM | POA: Insufficient documentation

## 2017-08-29 DIAGNOSIS — K219 Gastro-esophageal reflux disease without esophagitis: Secondary | ICD-10-CM | POA: Insufficient documentation

## 2017-08-29 DIAGNOSIS — L570 Actinic keratosis: Secondary | ICD-10-CM | POA: Diagnosis not present

## 2017-08-29 DIAGNOSIS — E785 Hyperlipidemia, unspecified: Secondary | ICD-10-CM | POA: Insufficient documentation

## 2017-08-29 DIAGNOSIS — D485 Neoplasm of uncertain behavior of skin: Secondary | ICD-10-CM | POA: Diagnosis not present

## 2017-08-29 DIAGNOSIS — F419 Anxiety disorder, unspecified: Secondary | ICD-10-CM | POA: Insufficient documentation

## 2017-08-29 DIAGNOSIS — I781 Nevus, non-neoplastic: Secondary | ICD-10-CM | POA: Diagnosis not present

## 2017-08-29 DIAGNOSIS — R7982 Elevated C-reactive protein (CRP): Secondary | ICD-10-CM | POA: Insufficient documentation

## 2017-08-29 DIAGNOSIS — M7541 Impingement syndrome of right shoulder: Secondary | ICD-10-CM | POA: Insufficient documentation

## 2017-08-29 DIAGNOSIS — R5383 Other fatigue: Secondary | ICD-10-CM | POA: Insufficient documentation

## 2017-08-29 DIAGNOSIS — R69 Illness, unspecified: Secondary | ICD-10-CM | POA: Diagnosis not present

## 2017-08-29 DIAGNOSIS — H61002 Unspecified perichondritis of left external ear: Secondary | ICD-10-CM | POA: Diagnosis not present

## 2017-08-29 DIAGNOSIS — S72141S Displaced intertrochanteric fracture of right femur, sequela: Secondary | ICD-10-CM | POA: Insufficient documentation

## 2017-08-29 DIAGNOSIS — Z9049 Acquired absence of other specified parts of digestive tract: Secondary | ICD-10-CM | POA: Diagnosis not present

## 2017-08-29 DIAGNOSIS — L821 Other seborrheic keratosis: Secondary | ICD-10-CM | POA: Diagnosis not present

## 2017-08-29 MED ORDER — OXYCODONE HCL 5 MG PO TABS: 5.0000 mg | ORAL_TABLET | Freq: Four times a day (QID) | ORAL | 0 refills | Status: DC | PRN

## 2017-08-29 MED ORDER — GABAPENTIN 100 MG PO CAPS: 100.0000 mg | ORAL_CAPSULE | Freq: Four times a day (QID) | ORAL | 0 refills | Status: DC

## 2017-08-29 NOTE — Progress Notes (Signed)
Nursing Pain Medication Assessment:  Safety precautions to be maintained throughout the outpatient stay will include: orient to surroundings, keep bed in low position, maintain call bell within reach at all times, provide assistance with transfer out of bed and ambulation.  Medication Inspection Compliance: Pill count conducted under aseptic conditions, in front of the patient. Neither the pills nor the bottle was removed from the patient's sight at any time. Once count was completed pills were immediately returned to the patient in their original bottle.  Medication: Oxycodone IR Pill/Patch Count: 9 of 120 pills remain Pill/Patch Appearance: Markings consistent with prescribed medication Bottle Appearance: Standard pharmacy container. Clearly labeled. Filled Date: 09 / 12 / 2018 Last Medication intake:  Today

## 2017-08-29 NOTE — Progress Notes (Signed)
Patient's Name: [name]  MRN: [id number]  Referring Provider: Albina Billet, MD  DOB: [birth year]  PCP: Albina Billet, MD  DOS: 08/29/2017  Note by: Gaspar Cola, MD  Service setting: Ambulatory outpatient  Specialty: Interventional Pain Management  Location: [facility] (AMB) Pain Management Facility    Patient type: Established   Primary Reason(s) for Visit: Encounter for prescription drug management & post-procedure evaluation of chronic illness with mild to moderate exacerbation(Level of risk: moderate) CC: Back Pain (lower); Neck Pain (back); and Shoulder Pain (both)  HPI  [name] is a 71 y.o. year old, female patient, who comes today for a post-procedure evaluation and medication management. 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); Cervical facet syndrome (Bilateral) (R>L); Cervical myofascial pain syndrome; Lumbar spondylosis; Chronic shoulder impingement syndrome (Right); Low magnesium levels; CRP elevated; Elevated sedimentation rate; Chronic obstructive pulmonary disease (COPD) ([place]); Nicotine dependence; Chronic shoulder pain (Right); Abnormal nerve conduction studies; Chronic shoulder pain (Bilateral) (status post multiple surgeries) (R>L); Cervical facet hypertrophy (Bilateral); History of shoulder surgery 5 (Right); Lumbar foraminal stenosis (L3-4) (Left); 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 Her primarily concern today is the Back Pain (lower); Neck Pain (back); and Shoulder Pain (both)  Pain Assessment: Location: Lower Back Radiating: "can't tell if lower back pain or pain from hip surgery last November" and down the side of the leg to the knee, but at times the pain goes to the  ankle;will have cataract sx next week Onset: More than a month ago Duration: Chronic pain Quality: Aching, Constant, Radiating Severity: 5 /10 (self-reported pain score)  Note: Reported level is inconsistent with clinical observations. Clinically the patient looks like a 3/10 Information on the proper use of the pain scale provided to the patient today. When using our objective Pain Scale, levels between 6 and 10/10 are said to belong in an emergency room, as it progressively worsens from a 6/10, described as severely limiting, requiring emergency care not usually available at an outpatient pain management facility. At a 6/10 level, communication becomes difficult and requires great effort. Assistance to reach the emergency department may be required. Facial flushing and profuse sweating along with potentially dangerous increases in heart rate and blood pressure will be evident. Effect on ADL: pace self Timing: Constant Modifying factors: medication  [name] was last seen on 08/01/2017 for a procedure. During today's appointment we reviewed [name] post-procedure results, as well as her outpatient medication regimen. She again is having some low back pain and was wondering if we could increase her pain medication. I declined to do that and instead I have offered her to proceed  with a repeat right-sided lumbar facet + sacroiliac joint block under fluoroscopic guidance and IV sedation. The prior block provided her with  100% relief of the pain for the duration of local anesthetic followed by over 90% relief of the pain for several days. Unfortunately, because she was feeling so good, she overdid it in terms of her activities and the pain has returned. At this point, should the patient get good relief with this second diagnostic injection, I will go ahead and schedule her for radiofrequency. This should provide her with longer lasting benefit and that way we may be able to go down and perhaps stop her narcotics. I'm not interested in going up on her opioids since her last UDS done on 06/05/2017 was abnormal showing none of the oxycodone, hydrocodone that I had not prescribe, and she also was positive for morphine. In addition, she continues to take the alprazolam which I have recommended to her to discontinue.  Further details on both, my assessment(s), as well as the proposed treatment plan, please see below.  Controlled Substance Pharmacotherapy Assessment REMS (Risk Evaluation and Mitigation Strategy)  Analgesic: Oxycodone IR 5 mg 1 tablet by mouth every 6 hours (20 mg/day of oxycodone) (30 MME/day) MME/day: 30 mg/day.  Lona Millard, RN  08/29/2017  2:24 PM  Sign at close encounter Nursing Pain Medication Assessment:  Safety precautions to be maintained throughout the outpatient stay will include: orient to surroundings, keep bed in low position, maintain call bell within reach at all times, provide assistance with transfer out of bed and ambulation.  Medication Inspection Compliance: Pill count conducted under aseptic conditions, in front of the patient. Neither the pills nor the bottle was removed from the patient's sight at any time. Once count was completed pills were immediately returned to the patient in their original bottle.  Medication: Oxycodone IR Pill/Patch  Count: 9 of 120 pills remain Pill/Patch Appearance: Markings consistent with prescribed medication Bottle Appearance: Standard pharmacy container. Clearly labeled. Filled Date: 09 / 12 / 2018 Last Medication intake:  Today   Pharmacokinetics: Liberation and absorption (onset of action): WNL Distribution (time to peak effect): WNL Metabolism and excretion (duration of action): WNL         Pharmacodynamics: Desired effects: Analgesia: [name] reports >50% benefit. Functional ability: Patient reports that medication allows her to accomplish basic ADLs Clinically meaningful improvement in function (CMIF): Sustained CMIF goals met Perceived effectiveness: Described as relatively effective, allowing for increase in activities of daily living (ADL) Undesirable effects: Side-effects or Adverse reactions: None reported Monitoring:  PMP: Online review of the past 63-monthperiod conducted. Compliant with practice rules and regulations Last UDS on record: Summary  Date Value Ref Range Status  06/05/2017 FINAL  Final    Comment:    ==================================================================== TOXASSURE SELECT 13 (MW) ==================================================================== Test                             Result       Flag       Units Drug Present and Declared for Prescription Verification   Alprazolam                     328          EXPECTED   ng/mg creat   Alpha-hydroxyalprazolam        1837         EXPECTED   ng/mg creat    Source of alprazolam is a scheduled prescription medication.    Alpha-hydroxyalprazolam is an expected metabolite of alprazolam.   Morphine  162          EXPECTED   ng/mg creat    Potential sources of morphine include administration of codeine    or morphine, use of heroin, or ingestion of poppy seeds. Drug Present not Declared for Prescription Verification   Hydrocodone                    2096         UNEXPECTED ng/mg  creat   Hydromorphone                  637          UNEXPECTED ng/mg creat   Dihydrocodeine                 200          UNEXPECTED ng/mg creat   Norhydrocodone                 2725         UNEXPECTED ng/mg creat    Sources of hydrocodone include scheduled prescription    medications. Dihydrocodeine and norhydrocodone are expected    metabolites of hydrocodone. Dihydrocodeine is also available as a    scheduled prescription medication.    Hydromorphone may be administered as a scheduled prescription    medication and is also an expected metabolite of hydrocodone and    a minor metabolite of morphine. Hydrocodone and/or its    metabolites and morphine are also present in this specimen. Drug Absent but Declared for Prescription Verification   Oxycodone                      Not Detected UNEXPECTED ng/mg creat ==================================================================== Test                      Result    Flag   Units      Ref Range   Creatinine              81               mg/dL      >=20 ==================================================================== Declared Medications:  The flagging and interpretation on this report are based on the  following declared medications.  Unexpected results may arise from  inaccuracies in the declared medications.  **Note: The testing scope of this panel includes these medications:  Alprazolam  Morphine (Morphine Sulfate)  Oxycodone  **Note: The testing scope of this panel does not include following  reported medications:  Albuterol (Combivent)  Bupropion  Cyanocobalamin  Docusate (Senokot-S)  Escitalopram  Fluticasone (Advair)  Ipratropium (Combivent)  Levothyroxine  Magnesium Oxide  Pantoprazole  Salmeterol (Advair)  Sennosides (Senokot-S)  Tizanidine  Vitamin D3 ==================================================================== For clinical consultation, please call [phone]. ====================================================================    UDS interpretation: Compliant          Medication Assessment Form: Reviewed. Patient indicates being compliant with therapy Treatment compliance: Compliant Risk Assessment Profile: Aberrant behavior: See prior evaluations. None observed or detected today Comorbid factors increasing risk of overdose: See prior notes. No additional risks detected today Risk of substance use disorder (SUD): Low     Opioid Risk Tool - 08/29/17 1419      Family History of Substance Abuse   Alcohol Negative   Illegal Drugs Negative   Rx Drugs Negative     Personal History of Substance Abuse   Alcohol Negative   Illegal Drugs Negative   Rx  Drugs Negative     Age   Age between 105-45 years  No     History of Preadolescent Sexual Abuse   History of Preadolescent Sexual Abuse Negative or Female     Psychological Disease   Psychological Disease Negative   Depression Negative     Total Score   Opioid Risk Tool Scoring 0   Opioid Risk Interpretation Low Risk     ORT Scoring interpretation table:  Score <3 = Low Risk for SUD  Score between 4-7 = Moderate Risk for SUD  Score >8 = High Risk for Opioid Abuse   Risk Mitigation Strategies:  Patient Counseling: Covered Patient-Prescriber Agreement (PPA): Present and active  Notification to other healthcare providers: Done  Pharmacologic Plan: No change in therapy, at this time  Laboratory Chemistry  Inflammation Markers (CRP: Acute Phase) (ESR: Chronic Phase) Lab Results  Component Value Date   CRP 14.3 (H) 06/05/2017   ESRSEDRATE 9 06/05/2017                 Renal Function Markers Lab Results  Component Value Date   BUN 14 06/05/2017   CREATININE 0.68 06/05/2017   GFRAA 102 06/05/2017   GFRNONAA 89 06/05/2017                 Hepatic Function Markers Lab Results  Component Value Date   AST 17 06/05/2017   ALT 14 06/05/2017   ALBUMIN 3.6 06/05/2017    ALKPHOS 124 (H) 06/05/2017                 Electrolytes Lab Results  Component Value Date   NA 144 06/05/2017   K 4.0 06/05/2017   CL 104 06/05/2017   CALCIUM 9.0 06/05/2017   MG 1.7 06/05/2017                 Neuropathy Markers Lab Results  Component Value Date   VITAMINB12 282 06/05/2017                 Bone Pathology Markers Lab Results  Component Value Date   ALKPHOS 124 (H) 06/05/2017   25OHVITD1 28 (L) 06/05/2017   25OHVITD2 3.0 06/05/2017   25OHVITD3 25 06/05/2017   CALCIUM 9.0 06/05/2017                 Coagulation Parameters Lab Results  Component Value Date   INR 1.08 10/01/2016   LABPROT 14.0 10/01/2016   PLT 405 02/20/2017                 Cardiovascular Markers Lab Results  Component Value Date   HGB 11.5 (L) 02/20/2017   HCT 35.0 02/20/2017                 Note: Lab results reviewed.  Recent Diagnostic Imaging Results  DG Lumbar Spine Complete W/Bend CLINICAL DATA:  71 year old female fractured hip in November with low back pain since. Initial encounter.  EXAM: LUMBAR SPINE - COMPLETE WITH BENDING VIEWS  COMPARISON:  02/10/2014.  FINDINGS: New from the 2015 lumbar spine plain film examination are T11 and T12 superior endplate compression fractures with 25-30% loss height. There is also a new L5 superior endplate compression fracture with 20% loss of height. Please see MR lumbar spine report performed same date and dictated separately.  Facet degenerative changes lumbar spine with minimal anterior slip L3. No abnormal motion between flexion and extension.  No pars defect.  Mild curvature lower thoracic lumbar spine convex right.  Vascular calcifications.  IMPRESSION: New from the 2015 lumbar spine plain film examination are T11 and T12 superior endplate compression fractures with 25-30% loss height. There is also a new L5 superior endplate compression fracture with 20% loss of height. Please see MR lumbar spine report performed  same date and dictated separately.  Facet degenerative changes lumbar spine with minimal anterior slip L3.  No abnormal motion between flexion and extension.  Mild curvature lower thoracic lumbar spine convex right.  Aortic Atherosclerosis (ICD10-I70.0).  Electronically Signed   By: Genia Del M.D.   On: 06/18/2017 20:04 MR LUMBAR SPINE W WO CONTRAST CLINICAL DATA:  Low back pain radiating into the right leg.  EXAM: MRI LUMBAR SPINE WITHOUT AND WITH CONTRAST  TECHNIQUE: Multiplanar and multiecho pulse sequences of the lumbar spine were obtained without and with intravenous contrast.  CONTRAST:  32m MULTIHANCE GADOBENATE DIMEGLUMINE 529 MG/ML IV SOLN  COMPARISON:  Lumbar spine x-rays dated February 10, 2014. MRI lumbar spine report dated December 15, 2010.  FINDINGS: Segmentation:  Standard.  Alignment:  Trace anterolisthesis of L3 on L4, unchanged.  Vertebrae: Mild increased edema and enhancement within the superior endplate of TG66 consistent with subacute compression fracture. There is approximately 25% height loss. No retropulsion. Chronic central superior endplate deformity of the T12 vertebral body. No evidence of discitis or focal bone lesion.  Conus medullaris: Extends to the L1 level and appears normal. No abnormal enhancement identified.  Paraspinal and other soft tissues: Bilateral renal cysts. Otherwise negative.  Disc levels:  T12-L1:  Normal.  L1-L2:  Normal.  L2-L3: Small broad-based disc bulge without spinal canal or neuroforaminal stenosis.  L3-L4: Small posterior disc bulge and bilateral facet arthropathy resulting in mild central spinal canal stenosis and moderate left neuroforaminal stenosis, unchanged. No right neuroforaminal stenosis.  L4-L5: Diffuse disc bulge and bilateral facet arthropathy resulting in moderate central spinal canal stenosis, mild narrowing of the bilateral lateral recesses, and moderate left and mild  right neuroforaminal stenosis.  L5-S1: Small diffuse disc bulge and mild bilateral facet arthropathy without spinal canal or neuroforaminal stenosis.  IMPRESSION: 1. Subacute compression deformity of the T11 vertebral body with approximately 25% height loss. 2. Central superior endplate compression deformity of the T12 vertebral body, new from prior studies, but chronic in appearance. 3. Worsened degenerative disc disease at L4-L5 where there is now moderate central spinal canal stenosis and moderate left neuroforaminal stenosis. 4. Unchanged mild central spinal canal stenosis and moderate left neuroforaminal stenosis at L3-L4.  Electronically Signed   By: WTitus DubinM.D.   On: 06/18/2017 17:30  Complexity Note: Imaging results reviewed. Results shared with Ms. PPontius using Layman's terms.                         Meds   Current Outpatient Prescriptions:  .  ALPRAZolam (XANAX) 1 MG tablet, Take 1 tablet (1 mg total) by mouth 4 (four) times daily as needed., Disp: 30 tablet, Rfl: 0 .  buPROPion (WELLBUTRIN SR) 150 MG 12 hr tablet, Take 150 mg by mouth 2 (two) times daily. , Disp: , Rfl:  .  Cholecalciferol (VITAMIN D3) 5000 units TABS, Take 1 tablet (5,000 Units total) by mouth daily., Disp: 30 tablet, Rfl: 2 .  escitalopram (LEXAPRO) 10 MG tablet, Take 10 mg by mouth daily. , Disp: , Rfl:  .  Fluticasone-Salmeterol (ADVAIR) 100-50 MCG/DOSE AEPB, Inhale 1 puff into the lungs every 12 (twelve) hours., Disp: , Rfl:  .  Ipratropium-Albuterol (COMBIVENT RESPIMAT) 20-100 MCG/ACT AERS respimat, Inhale 1 puff into the lungs every 6 (six) hours as needed for wheezing or shortness of breath., Disp: , Rfl:  .  levothyroxine (SYNTHROID, LEVOTHROID) 88 MCG tablet, Take 88 mcg by mouth daily before breakfast. , Disp: , Rfl:  .  [START ON 08/31/2017] oxyCODONE (OXY IR/ROXICODONE) 5 MG immediate release tablet, Take 1 tablet (5 mg total) by mouth every 6 (six) hours as needed for severe pain.,  Disp: 120 tablet, Rfl: 0 .  traZODone (DESYREL) 150 MG tablet, Take 300 mg by mouth at bedtime. , Disp: , Rfl:  .  gabapentin (NEURONTIN) 100 MG capsule, Take 1 capsule (100 mg total) by mouth 4 (four) times daily., Disp: 120 capsule, Rfl: 0 .  magnesium oxide (MAG-OX) 400 MG tablet, Take 1 tablet (400 mg total) by mouth 2 (two) times daily., Disp: 180 tablet, Rfl: 0 .  tizanidine (ZANAFLEX) 2 MG capsule, Take 1 capsule (2 mg total) by mouth 3 (three) times daily as needed for muscle spasms., Disp: 90 capsule, Rfl: 2  ROS  Constitutional: Denies any fever or chills Gastrointestinal: No reported hemesis, hematochezia, vomiting, or acute GI distress Musculoskeletal: Denies any acute onset joint swelling, redness, loss of ROM, or weakness Neurological: No reported episodes of acute onset apraxia, aphasia, dysarthria, agnosia, amnesia, paralysis, loss of coordination, or loss of consciousness  Allergies  [name] has No Known Allergies.  [place]  Drug: [name]  reports that she does not use drugs. Alcohol:  reports that she does not drink alcohol. Tobacco:  reports that she has been smoking Cigarettes.  She has been smoking about 0.50 packs per day. She has never used smokeless tobacco. Medical:  has a past medical history of Anxiety; CAD (coronary artery disease); Carpal tunnel syndrome; Cataract; Complication of anesthesia; COPD (chronic obstructive pulmonary disease) ([place]); CRP elevated (09/14/2015); Depression; Difficult intubation; Dyslipidemia; Dyspnea; Dysrhythmia; Elevated sedimentation rate (09/14/2015); Esophageal spasm; Gastrointestinal parasites; GERD (gastroesophageal reflux disease); Hiatal hernia; History of peptic ulcer disease; Hyperthyroidism; Hypothyroidism; Low magnesium levels (09/14/2015); Pelvic fracture ([place]) (2008); Reflux; Rotator cuff injury; and Stenosis, spinal, lumbar. Surgical: [name]  has a past surgical history that includes Cesarean section; Neck surgery;  Cholecystectomy; Carpal tunnel release; Rotator cuff repair; Shoulder arthroscopy (12/07/2011); Shoulder surgery (12/07/2011); Intramedullary (im) nail intertrochanteric (Right, 10/01/2016); Fracture surgery; Appendectomy; Nose surgery; Back surgery; Cataract extraction w/PHACO (Right, 08/07/2017); and Hip surgery. Family: family history includes Aneurysm in her unknown relative.  Constitutional Exam  General appearance: Well nourished, well developed, and well hydrated. In no apparent acute distress Vitals:   08/29/17 1407  BP: 138/85  Pulse: (!) 116  Resp: 16  Temp: 98.3 F (36.8 C)  SpO2: 99%  Weight: 108 lb (49 kg)  Height: 5' 4"  (1.626 m)   BMI Assessment: Estimated body mass index is 18.54 kg/m as calculated from the following:   Height as of this encounter: 5' 4"  (1.626 m).   Weight as of this encounter: 108 lb (49 kg).  BMI interpretation table: BMI level Category Range association with higher incidence of chronic pain  <18 kg/m2 Underweight   18.5-24.9 kg/m2 Ideal body weight   25-29.9 kg/m2 Overweight Increased incidence by 20%  30-34.9 kg/m2 Obese (Class I) Increased incidence by 68%  35-39.9 kg/m2 Severe obesity (Class II) Increased incidence by 136%  >40 kg/m2 Extreme obesity (Class III) Increased incidence by 254%   BMI Readings from Last 4 Encounters:  08/29/17 18.54 kg/m  08/07/17 18.54 kg/m  08/01/17  18.54 kg/m  06/26/17 16.82 kg/m   Wt Readings from Last 4 Encounters:  08/29/17 108 lb (49 kg)  08/07/17 108 lb (49 kg)  08/01/17 108 lb (49 kg)  06/26/17 98 lb (44.5 kg)  Psych/Mental status: Alert, oriented x 3 (person, place, & time)       Eyes: PERLA Respiratory: No evidence of acute respiratory distress  Cervical Spine Area Exam  Skin & Axial Inspection: No masses, redness, edema, swelling, or associated skin lesions Alignment: Symmetrical Functional ROM: Unrestricted ROM      Stability: No instability detected Muscle Tone/Strength: Functionally  intact. No obvious neuro-muscular anomalies detected. Sensory (Neurological): Unimpaired Palpation: No palpable anomalies              Upper Extremity (UE) Exam    Side: Right upper extremity  Side: Left upper extremity  Skin & Extremity Inspection: Skin color, temperature, and hair growth are WNL. No peripheral edema or cyanosis. No masses, redness, swelling, asymmetry, or associated skin lesions. No contractures.  Skin & Extremity Inspection: Skin color, temperature, and hair growth are WNL. No peripheral edema or cyanosis. No masses, redness, swelling, asymmetry, or associated skin lesions. No contractures.  Functional ROM: Unrestricted ROM          Functional ROM: Unrestricted ROM          Muscle Tone/Strength: Functionally intact. No obvious neuro-muscular anomalies detected.  Muscle Tone/Strength: Functionally intact. No obvious neuro-muscular anomalies detected.  Sensory (Neurological): Unimpaired          Sensory (Neurological): Unimpaired          Palpation: No palpable anomalies              Palpation: No palpable anomalies              Specialized Test(s): Deferred         Specialized Test(s): Deferred          Thoracic Spine Area Exam  Skin & Axial Inspection: No masses, redness, or swelling Alignment: Symmetrical Functional ROM: Unrestricted ROM Stability: No instability detected Muscle Tone/Strength: Functionally intact. No obvious neuro-muscular anomalies detected. Sensory (Neurological): Unimpaired Muscle strength & Tone: No palpable anomalies  Lumbar Spine Area Exam  Skin & Axial Inspection: No masses, redness, or swelling Alignment: Symmetrical Functional ROM: Minimal ROM      Stability: No instability detected Muscle Tone/Strength: Functionally intact. No obvious neuro-muscular anomalies detected. Sensory (Neurological): Movement-associated pain Palpation: Complains of area being tender to palpation       Provocative Tests: Lumbar Hyperextension and rotation test:  Positive bilaterally for facet joint pain.  (R>L) Lumbar Lateral bending test: evaluation deferred today       Patrick's Maneuver: Positive for right-sided S-I arthralgia              Gait & Posture Assessment  Ambulation: Unassisted Gait: Antalgic Posture: Antalgic   Lower Extremity Exam    Side: Right lower extremity  Side: Left lower extremity  Skin & Extremity Inspection: Skin color, temperature, and hair growth are WNL. No peripheral edema or cyanosis. No masses, redness, swelling, asymmetry, or associated skin lesions. No contractures.  Skin & Extremity Inspection: Skin color, temperature, and hair growth are WNL. No peripheral edema or cyanosis. No masses, redness, swelling, asymmetry, or associated skin lesions. No contractures.  Functional ROM: Unrestricted ROM          Functional ROM: Unrestricted ROM          Muscle Tone/Strength: Functionally intact. No obvious neuro-muscular anomalies  detected.  Muscle Tone/Strength: Functionally intact. No obvious neuro-muscular anomalies detected.  Sensory (Neurological): Unimpaired  Sensory (Neurological): Unimpaired  Palpation: No palpable anomalies  Palpation: No palpable anomalies   Assessment  Primary Diagnosis & Pertinent Problem List: The primary encounter diagnosis was Chronic low back pain (Primary Area of Pain) (Right). Diagnoses of Lumbar facet syndrome (Right), Chronic sacroiliac joint pain (Right), Lumbar facet hypertrophy (Bilateral), Chronic pain syndrome, and Neurogenic pain were also pertinent to this visit.  Status Diagnosis  Worsening Worsening Worsening 1. Chronic low back pain (Primary Area of Pain) (Right)   2. Lumbar facet syndrome (Right)   3. Chronic sacroiliac joint pain (Right)   4. Lumbar facet hypertrophy (Bilateral)   5. Chronic pain syndrome   6. Neurogenic pain     Problems updated and reviewed during this visit: Problem  Neurogenic Pain  Chronic low back pain (Primary Area of Pain) (Right)  Lumbar  facet syndrome (Right)   Plan of Care  Pharmacotherapy (Medications Ordered): Meds ordered this encounter  Medications  . oxyCODONE (OXY IR/ROXICODONE) 5 MG immediate release tablet    Sig: Take 1 tablet (5 mg total) by mouth every 6 (six) hours as needed for severe pain.    Dispense:  120 tablet    Refill:  0    Do not place this medication, or any other prescription from our practice, on "Automatic Refill". Patient may have prescription filled one day early if pharmacy is closed on scheduled refill date. Do not fill until: 08/31/17 To last until: 09/30/17  . gabapentin (NEURONTIN) 100 MG capsule    Sig: Take 1 capsule (100 mg total) by mouth 4 (four) times daily.    Dispense:  120 capsule    Refill:  0    Do not place this medication, or any other prescription from our practice, on "Automatic Refill". Patient may have prescription filled one day early if pharmacy is closed on scheduled refill date.   New Prescriptions   GABAPENTIN (NEURONTIN) 100 MG CAPSULE    Take 1 capsule (100 mg total) by mouth 4 (four) times daily.   Medications administered today: [name] had no medications administered during this visit.   Procedure Orders     LUMBAR FACET(MEDIAL BRANCH NERVE BLOCK) MBNB     SACROILIAC JOINT INJECTINS     LUMBAR FACET(MEDIAL BRANCH NERVE BLOCK) MBNB Lab Orders  No laboratory test(s) ordered today   Imaging Orders  No imaging studies ordered today   Referral Orders  No referral(s) requested today    Interventional management options: Planned, scheduled, and/or pending:   Diagnostic right lumbar facet block #2 + right sacroiliac joint block #2 under fluoroscopic guidance and IV sedation.   Considering:   Diagnostic right-sidedlumbarfacet + right-sided sacroiliac joint block#2 Diagnostic right-sided sacroiliac joint block  Possible right sided sacroiliac joint RFA  Diagnostic right-sided lumbar facet block  Possible right-sided lumbar facet RFA   Diagnostic  bilateral cervical facetblock  Possible bilateral cervical facet radiofrequencyablation  Diagnostic right-sided cervical epidural steroid injection  Diagnostic bilateral intra-articular shoulderjoint injection  Diagnostic bilateral suprascapularnerve block  Possible bilateral suprascapular nerve radiofrequencyablation  Diagnostic left-sided L3-4 transforaminal epiduralsteroid injection  Diagnostic L3-4 versus L4-5 lumbar epiduralsteroid injection    Palliative PRN treatment(s):   Palliative right-sided sacroiliac joint block Palliative right-sided lumbar facet block   Provider-requested follow-up: Return for Procedure (w/ sedation): (R) L-FCT Blk + (R) SI Blk.  No future appointments. Primary Care Physician: Albina Billet, MD Location: [facility]  Note by: Gaspar Cola, MD Date: 08/29/2017; Time: 3:52 PM

## 2017-08-29 NOTE — Patient Instructions (Addendum)
____________________________________________________________________________________________  Preparing for Procedure with Sedation Instructions: . Oral Intake: Do not eat or drink anything for at least 8 hours prior to your procedure. . Transportation: Public transportation is not allowed. Bring an adult driver. The driver must be physically present in our waiting room before any procedure can be started. Marland Kitchen Physical Assistance: Bring an adult physically capable of assisting you, in the event you need help. This adult should keep you company at home for at least 6 hours after the procedure. . Blood Pressure Medicine: Take your blood pressure medicine with a sip of water the morning of the procedure. . Blood thinners:  . Diabetics on insulin: Notify the staff so that you can be scheduled 1st case in the morning. If your diabetes requires high dose insulin, take only  of your normal insulin dose the morning of the procedure and notify the staff that you have done so. . Preventing infections: Shower with an antibacterial soap the morning of your procedure. . Build-up your immune system: Take 1000 mg of Vitamin C with every meal (3 times a day) the day prior to your procedure. Marland Kitchen Antibiotics: Inform the staff if you have a condition or reason that requires you to take antibiotics before dental procedures. . Pregnancy: If you are pregnant, call and cancel the procedure. . Sickness: If you have a cold, fever, or any active infections, call and cancel the procedure. . Arrival: You must be in the facility at least 30 minutes prior to your scheduled procedure. . Children: Do not bring children with you. . Dress appropriately: Bring dark clothing that you would not mind if they get stained. . Valuables: Do not bring any jewelry or valuables. Procedure appointments are reserved for interventional treatments only. Marland Kitchen No Prescription Refills. . No medication changes will be discussed during procedure  appointments. . No disability issues will be discussed. ____________________________________________________________________________________________  ____________________________________________________________________________________________  Gabapentin Titration  Medication used: Gabapentin (Generic Name) or Neurontin (Brand Name) 100 mg tablets/capsules  Reasons to stop increasing the dose:  Reason 1: You get good relief of symptoms, in which case there is no need to increase the daily dose any further.    Reason 2: You develop some side effects, such as sleeping all of the time, difficulty concentrating, or becoming disoriented, in which case you need to go down on the dose, to the prior level, where you were not experiencing any side effects. Stay on that dose longer, to allow more time for your body to get use it, before attempting to increase it again.   Steps to increase medication: Step 1: Start by taking 1 (one) tablet at bedtime x 7 (seven) days.  Step 2: Increase dose to 2 (two) tablets at bedtime. Stay on this dose x 7 (seven) days.  Step 3: Next increase it to 3 (three) tablets at bedtime. Stay on this dose x another 7 (seven) days.  Step 4: Next, add 1 (one) tablet at noon with lunch. Continue this dose x another 7 (seven) days.  Step 5: Add 1 (one) tablet in the afternoon with dinner. Stay on this dose x another 7 (seven) days.  Step 6: At this point you should be taking the medicine 4 (four) times a day. This daily regimen of taking the medicine 4 (four) times a day, will be maintained from now on. You should not take any doses any sooner than every 6 (six) hours.  Step 7: After 7 (seven) days of taking 3 (three) tablet at bedtime,  1 (one) tablet at noon, 1 (one) tablet in the afternoon, and 1 (one) tablet in the morning, begin taking 2 (two) tablets at noon with lunch. Stay on this dose x another 7 (seven) days.   Step 8: After 7 (seven) days of taking 3 (three)  tablet at bedtime, 2 (two) tablets at noon, 1 (one) tablet in the afternoon, and 1 (one) tablet in the morning, begin taking 2 (two) tablets in the afternoon with dinner. Stay on this dose x another 7 (seven) days.   Step 9: After 7 (seven) days of taking 3 (three) tablet at bedtime, 2 (two) tablets at noon, 2 (two) tablets in the afternoon, and 1 (one) tablet in the morning, begin taking 2 (two) tablets in the morning with breakfast. Stay on this dose x another 7 (seven) days. At this point you should be taking the medicine 4 (four) times a day, or about every 6 (six) hours. This daily regimen of taking the medicine 4 (four) times a day, will be maintained from now on. You should not take any doses any sooner than every 6 (six) hours.  Step 10: After 7 (seven) days of taking 3 (three) tablet at bedtime, 2 (two) tablets at noon, 2 (two) tablets in the afternoon, and 2 (two) tablets in the morning, begin taking 3 (three) tablets at noon with lunch. Stay on this dose x another 7 (seven) days.   Step 11: After 7 (seven) days of taking 3 (three) tablet at bedtime, 3 (three) tablets at noon, 2 (two) tablets in the afternoon, and 2 (two) tablets in the morning, begin taking 3 (three) tablets in the afternoon with dinner. Stay on this dose x another 7 (seven) days.   Step 12: After 7 (seven) days of taking 3 (three) tablet at bedtime, 3 (three) tablets at noon, 3 (three) tablets in the afternoon, and 2 (two) tablet in the morning, begin taking 3 (three) tablets in the morning with breakfast. Stay on this dose x another 7 (seven) days. At this point you should be taking the medicine 4 (four) times a day, or about every 6 (six) hours. This daily regimen of taking the medicine 4 (four) times a day, will be maintained from now on.   Endpoint: Once you have reached the maximum dose you can tolerate without side-effects, contact your physician so as to evaluate the results of the regimen.   Questions: Feel free to  contact us for any questions or problems at [phone] ____________________________________________________________________________________________ Pain Management Discharge Instructions  General Discharge Instructions :  If you need to reach your doctor call: Monday-Friday 8:00 am - 4:00 pm at [phone] or toll free [phone].  After clinic hours [phone] to have operator reach doctor.  Bring all of your medication bottles to all your appointments in the pain clinic.  To cancel or reschedule your appointment with Pain Management please remember to call 24 hours in advance to avoid a fee.  Refer to the educational materials which you have been given on: General Risks, I had my Procedure. Discharge Instructions, Post Sedation.  Post Procedure Instructions:  The drugs you were given will stay in your system until tomorrow, so for the next 24 hours you should not drive, make any legal decisions or drink any alcoholic beverages.  You may eat anything you prefer, but it is better to start with liquids then soups and crackers, and gradually work up to solid foods.  Please notify your doctor immediately if you have any  unusual bleeding, trouble breathing or pain that is not related to your normal pain.  Depending on the type of procedure that was done, some parts of your body may feel week and/or numb.  This usually clears up by tonight or the next day.  Walk with the use of an assistive device or accompanied by an adult for the 24 hours.  You may use ice on the affected area for the first 24 hours.  Put ice in a Ziploc bag and cover with a towel and place against area 15 minutes on 15 minutes off.  You may switch to heat after 24 hours.Sacroiliac (SI) Joint Injection Patient Information  Description: The sacroiliac joint connects the scrum (very low back and tailbone) to the ilium (a pelvic bone which also forms half of the hip joint).  Normally this joint experiences very  little motion.  When this joint becomes inflamed or unstable low back and or hip and pelvis pain may result.  Injection of this joint with local anesthetics (numbing medicines) and steroids can provide diagnostic information and reduce pain.  This injection is performed with the aid of x-ray guidance into the tailbone area while you are lying on your stomach.   You may experience an electrical sensation down the leg while this is being done.  You may also experience numbness.  We also may ask if we are reproducing your normal pain during the injection.  Conditions which may be treated SI injection:   Low back, buttock, hip or leg pain  Preparation for the Injection:  1. Do not eat any solid food or dairy products within 8 hours of your appointment.  2. You may drink clear liquids up to 3 hours before appointment.  Clear liquids include water, black coffee, juice or soda.  No milk or cream please. 3. You may take your regular medications, including pain medications with a sip of water before your appointment.  Diabetics should hold regular insulin (if take separately) and take 1/2 normal NPH dose the morning of the procedure.  Carry some sugar containing items with you to your appointment. 4. A driver must accompany you and be prepared to drive you home after your procedure. 5. Bring all of your current medications with you. 6. An IV may be inserted and sedation may be given at the discretion of the physician. 7. A blood pressure cuff, EKG and other monitors will often be applied during the procedure.  Some patients may need to have extra oxygen administered for a short period.  8. You will be asked to provide medical information, including your allergies, prior to the procedure.  We must know immediately if you are taking blood thinners (like Coumadin/Warfarin) or if you are allergic to IV iodine contrast (dye).  We must know if you could possible be pregnant.  Possible side effects:   Bleeding  from needle site  Infection (rare, may require surgery)  Nerve injury (rare)  Numbness & tingling (temporary)  A brief convulsion or seizure  Light-headedness (temporary)  Pain at injection site (several days)  Decreased blood pressure (temporary)  Weakness in the leg (temporary)   Call if you experience:   New onset weakness or numbness of an extremity below the injection site that last more than 8 hours.  Hives or difficulty breathing ( go to the emergency room)  Inflammation or drainage at the injection site  Any new symptoms which are concerning to you  Please note:  Although the local anesthetic injected  can often make your back/ hip/ buttock/ leg feel good for several hours after the injections, the pain will likely return.  It takes 3-7 days for steroids to work in the sacroiliac area.  You may not notice any pain relief for at least that one week.  If effective, we will often do a series of three injections spaced 3-6 weeks apart to maximally decrease your pain.  After the initial series, we generally will wait some months before a repeat injection of the same type.  If you have any questions, please call [phone] [facility]   Facet Joint Block The facet joints connect the bones of the spine (vertebrae). They make it possible for you to bend, twist, and make other movements with your spine. They also keep you from bending too far, twisting too far, and making other excessive movements. A facet joint block is a procedure where a numbing medicine (anesthetic) is injected into a facet joint. Often, a type of anti-inflammatory medicine called a steroid is also injected. A facet joint block may be done to diagnose neck or back pain. If the pain gets better after a facet joint block, it means the pain is probably coming from the facet joint. If the pain does not get better, it means the pain is probably not coming from the facet  joint. A facet joint block may also be done to relieve neck or back pain caused by an inflamed facet joint. A facet joint block is only done to relieve pain if the pain does not improve with other methods, such as medicine, exercise programs, and physical therapy. Tell a health care provider about:  Any allergies you have.  All medicines you are taking, including vitamins, herbs, eye drops, creams, and over-the-counter medicines.  Any problems you or family members have had with anesthetic medicines.  Any blood disorders you have.  Any surgeries you have had.  Any medical conditions you have.  Whether you are pregnant or may be pregnant. What are the risks? Generally, this is a safe procedure. However, problems may occur, including:  Bleeding.  Injury to a nerve near the injection site.  Pain at the injection site.  Weakness or numbness in areas controlled by nerves near the injection site.  Infection.  Temporary fluid retention.  Allergic reactions to medicines or dyes.  Injury to other structures or organs near the injection site.  What happens before the procedure?  Follow instructions from your health care provider about eating or drinking restrictions.  Ask your health care provider about: ? Changing or stopping your regular medicines. This is especially important if you are taking diabetes medicines or blood thinners. ? Taking medicines such as aspirin and ibuprofen. These medicines can thin your blood. Do not take these medicines before your procedure if your health care provider instructs you not to.  Do not take any new dietary supplements or medicines without asking your health care provider first.  Plan to have someone take you home after the procedure. What happens during the procedure?  You may need to remove your clothing and dress in an open-back gown.  The procedure will be done while you are lying on an X-ray table. You will most likely be asked to  lie on your stomach, but you may be asked to lie in a different position if an injection will be made in your neck.  Machines will be used to monitor your oxygen levels, heart rate, and blood  pressure.  If an injection will be made in your neck, an IV tube will be inserted into one of your veins. Fluids and medicine will flow directly into your body through the IV tube.  The area over the facet joint where the injection will be made will be cleaned with soap. The surrounding skin will be covered with clean drapes.  A numbing medicine (local anesthetic) will be applied to your skin. Your skin may sting or burn for a moment.  A video X-ray machine (fluoroscopy) will be used to locate the joint. In some cases, a CT scan may be used.  A contrast dye may be injected into the facet joint area to help locate the joint.  When the joint is located, an anesthetic will be injected into the joint through the needle.  Your health care provider will ask you whether you feel pain relief. If you do feel relief, a steroid may be injected to provide pain relief for a longer period of time. If you do not feel relief or feel only partial relief, additional injections of an anesthetic may be made in other facet joints.  The needle will be removed.  Your skin will be cleaned.  A bandage (dressing) will be applied over each injection site. The procedure may vary among health care providers and hospitals. What happens after the procedure?  You will be observed for 15-30 minutes before being allowed to go home. This information is not intended to replace advice given to you by your health care provider. Make sure you discuss any questions you have with your health care provider. Document Released: 03/28/2007 Document Revised: 12/08/2015 Document Reviewed: 08/02/2015 Elsevier Interactive Patient Education  2018 [place]  Facet Joint Block, Care After Refer to this sheet in the next few weeks. These  instructions provide you with information about caring for yourself after your procedure. Your health care provider may also give you more specific instructions. Your treatment has been planned according to current medical practices, but problems sometimes occur. Call your health care provider if you have any problems or questions after your procedure. What can I expect after the procedure? After the procedure, it is common to have:  Some tenderness over the injection sites for 2 days after the procedure.  A temporary increase in blood sugar if you have diabetes.  Follow these instructions at home:  Keep track of the amount of pain relief you feel and how long it lasts.  Take over-the-counter and prescription medicines only as told by your health care provider. You may need to limit pain medicine within the first 4-6 hours after the procedure.  Remove your bandages (dressings) the morning after the procedure.  For the first 24 hours after the procedure: ? Do not apply heat near or over the injection sites. ? Do not take a bath or soak in water, such as in a pool or lake. ? Do not drive or operate heavy machinery unless approved by your health care provider. ? Avoid activities that require a lot of energy.  If the injection site is tender, try applying ice to the area. To do this: ? Put ice in a plastic bag. ? Place a towel between your skin and the bag. ? Leave the ice on for 20 minutes, 2-3 times a day.  Keep all follow-up visits as told by your health care provider. This is important. Contact a health care provider if:  Fluid is coming from an injection site.  There is  significant bleeding or swelling at an injection site.  You have diabetes and your blood sugar is above 180 mg/dL. Get help right away if:  You have a fever.  You have worsening pain or swelling around an injection site.  There are red streaks around an injection site.  You develop severe pain that is not  controlled by your medicines.  You develop a headache, stiff neck, nausea, or vomiting.  Your eyes become very sensitive to light.  You have weakness, paralysis, or tingling in your arms or legs that was not present before the procedure.  You have difficulty urinating or breathing. This information is not intended to replace advice given to you by your health care provider. Make sure you discuss any questions you have with your health care provider. Document Released: 10/23/2012 Document Revised: 03/22/2016 Document Reviewed: 08/02/2015 Elsevier Interactive Patient Education  [employer].

## 2017-08-30 ENCOUNTER — Encounter: Payer: Self-pay | Admitting: *Deleted

## 2017-09-04 ENCOUNTER — Encounter: Payer: Self-pay | Admitting: Emergency Medicine

## 2017-09-04 ENCOUNTER — Ambulatory Visit: Payer: Medicare HMO | Admitting: Anesthesiology

## 2017-09-04 ENCOUNTER — Encounter
Admission: RE | Disposition: A | Discharge status: Home / Self Care | Payer: Self-pay | Source: Ambulatory Visit | Attending: Ophthalmology

## 2017-09-04 ENCOUNTER — Ambulatory Visit
Admission: RE | Admit: 2017-09-04 | Discharge: 2017-09-04 | Disposition: A | Discharge status: Home / Self Care | Payer: Medicare HMO | Source: Ambulatory Visit | Attending: Ophthalmology | Admitting: Ophthalmology

## 2017-09-04 DIAGNOSIS — E079 Disorder of thyroid, unspecified: Secondary | ICD-10-CM | POA: Diagnosis not present

## 2017-09-04 DIAGNOSIS — F17200 Nicotine dependence, unspecified, uncomplicated: Secondary | ICD-10-CM | POA: Insufficient documentation

## 2017-09-04 DIAGNOSIS — H2512 Age-related nuclear cataract, left eye: Secondary | ICD-10-CM | POA: Insufficient documentation

## 2017-09-04 DIAGNOSIS — I2510 Atherosclerotic heart disease of native coronary artery without angina pectoris: Secondary | ICD-10-CM | POA: Diagnosis not present

## 2017-09-04 DIAGNOSIS — E039 Hypothyroidism, unspecified: Secondary | ICD-10-CM | POA: Diagnosis not present

## 2017-09-04 DIAGNOSIS — R69 Illness, unspecified: Secondary | ICD-10-CM | POA: Diagnosis not present

## 2017-09-04 DIAGNOSIS — Z79899 Other long term (current) drug therapy: Secondary | ICD-10-CM | POA: Diagnosis not present

## 2017-09-04 DIAGNOSIS — K219 Gastro-esophageal reflux disease without esophagitis: Secondary | ICD-10-CM | POA: Diagnosis not present

## 2017-09-04 DIAGNOSIS — M1990 Unspecified osteoarthritis, unspecified site: Secondary | ICD-10-CM | POA: Diagnosis not present

## 2017-09-04 DIAGNOSIS — J449 Chronic obstructive pulmonary disease, unspecified: Secondary | ICD-10-CM | POA: Insufficient documentation

## 2017-09-04 DIAGNOSIS — F329 Major depressive disorder, single episode, unspecified: Secondary | ICD-10-CM | POA: Insufficient documentation

## 2017-09-04 HISTORY — PX: CATARACT EXTRACTION W/PHACO: SHX586

## 2017-09-04 SURGERY — PHACOEMULSIFICATION, CATARACT, WITH IOL INSERTION
Anesthesia: Monitor Anesthesia Care | Site: Eye | Laterality: Left | Wound class: Clean

## 2017-09-04 MED ORDER — NA CHONDROIT SULF-NA HYALURON 40-17 MG/ML IO SOLN
INTRAOCULAR | Status: DC | PRN
  Administered 2017-09-04: 1 mL via INTRAOCULAR

## 2017-09-04 MED ORDER — LIDOCAINE HCL (PF) 4 % IJ SOLN
INTRAMUSCULAR | Status: AC
  Filled 2017-09-04: qty 5

## 2017-09-04 MED ORDER — LIDOCAINE HCL (PF) 4 % IJ SOLN
INTRAOCULAR | Status: DC | PRN
  Administered 2017-09-04: 2 mL via OPHTHALMIC

## 2017-09-04 MED ORDER — EPINEPHRINE PF 1 MG/ML IJ SOLN
INTRAMUSCULAR | Status: AC
  Filled 2017-09-04: qty 1

## 2017-09-04 MED ORDER — POVIDONE-IODINE 5 % OP SOLN
OPHTHALMIC | Status: DC | PRN
  Administered 2017-09-04: 1 via OPHTHALMIC

## 2017-09-04 MED ORDER — CARBACHOL 0.01 % IO SOLN
INTRAOCULAR | Status: DC | PRN
Start: 2017-09-04 — End: 2017-09-04
  Administered 2017-09-04: .5 mL via INTRAOCULAR

## 2017-09-04 MED ORDER — MIDAZOLAM HCL 5 MG/5ML IJ SOLN
INTRAMUSCULAR | Status: DC | PRN
  Administered 2017-09-04: 2 mg via INTRAVENOUS

## 2017-09-04 MED ORDER — ALFENTANIL 500 MCG/ML IJ INJ
INJECTION | INTRAVENOUS | Status: DC | PRN
  Administered 2017-09-04 (×2): 500 ug via INTRAVENOUS

## 2017-09-04 MED ORDER — MOXIFLOXACIN HCL 0.5 % OP SOLN: 1.0000 [drp] | OPHTHALMIC | Status: DC | PRN

## 2017-09-04 MED ORDER — NA CHONDROIT SULF-NA HYALURON 40-17 MG/ML IO SOLN
INTRAOCULAR | Status: AC
  Filled 2017-09-04: qty 1

## 2017-09-04 MED ORDER — EPINEPHRINE PF 1 MG/ML IJ SOLN
INTRAMUSCULAR | Status: DC | PRN
  Administered 2017-09-04: 1 mL via OPHTHALMIC

## 2017-09-04 MED ORDER — SODIUM CHLORIDE 0.9 % IV SOLN
INTRAVENOUS | Status: DC
  Administered 2017-09-04 (×2): via INTRAVENOUS

## 2017-09-04 MED ORDER — ARMC OPHTHALMIC DILATING DROPS
1.0000 "application " | OPHTHALMIC | Status: AC
  Administered 2017-09-04 (×3): 1 via OPHTHALMIC

## 2017-09-04 MED ORDER — MIDAZOLAM HCL 2 MG/2ML IJ SOLN
INTRAMUSCULAR | Status: AC
  Filled 2017-09-04: qty 2

## 2017-09-04 MED ORDER — POVIDONE-IODINE 5 % OP SOLN
OPHTHALMIC | Status: AC
  Filled 2017-09-04: qty 30

## 2017-09-04 MED ORDER — MOXIFLOXACIN HCL 0.5 % OP SOLN
OPHTHALMIC | Status: DC | PRN
  Administered 2017-09-04: .2 mL via OPHTHALMIC

## 2017-09-04 MED ORDER — ARMC OPHTHALMIC DILATING DROPS
OPHTHALMIC | Status: AC
  Administered 2017-09-04: 1 via OPHTHALMIC
  Filled 2017-09-04: qty 0.4

## 2017-09-04 MED ORDER — MOXIFLOXACIN HCL 0.5 % OP SOLN
OPHTHALMIC | Status: AC
  Filled 2017-09-04: qty 3

## 2017-09-04 SURGICAL SUPPLY — 17 items
GLOVE BIO SURGEON STRL SZ8 (GLOVE) ×2 IMPLANT
GLOVE BIOGEL M 6.5 STRL (GLOVE) ×2 IMPLANT
GLOVE SURG LX 8.0 MICRO (GLOVE) ×1
GLOVE SURG LX STRL 8.0 MICRO (GLOVE) ×1 IMPLANT
GOWN STRL REUS W/ TWL LRG LVL3 (GOWN DISPOSABLE) ×2 IMPLANT
GOWN STRL REUS W/TWL LRG LVL3 (GOWN DISPOSABLE) ×4
LABEL CATARACT MEDS ST (LABEL) ×2 IMPLANT
LENS IOL TECNIS ITEC 20.5 (Intraocular Lens) ×1 IMPLANT
PACK CATARACT (MISCELLANEOUS) ×2 IMPLANT
PACK CATARACT BRASINGTON LX (MISCELLANEOUS) ×2 IMPLANT
PACK EYE AFTER SURG (MISCELLANEOUS) ×2 IMPLANT
RING MALYGIN (MISCELLANEOUS) ×2 IMPLANT
SOL BSS BAG (MISCELLANEOUS) ×2
SOLUTION BSS BAG (MISCELLANEOUS) ×1 IMPLANT
SYR 5ML LL (SYRINGE) ×2 IMPLANT
WATER STERILE IRR 250ML POUR (IV SOLUTION) ×2 IMPLANT
WIPE NON LINTING 3.25X3.25 (MISCELLANEOUS) ×2 IMPLANT

## 2017-09-04 NOTE — H&P (Signed)
All labs reviewed. Abnormal studies sent to patients PCP when indicated.  Previous H&P reviewed, patient examined, there are NO CHANGES.  [name] LOUIS10/16/20188:52 AM

## 2017-09-04 NOTE — Transfer of Care (Signed)
Immediate Anesthesia Transfer of Care Note  Patient: Stewardson  Procedure(s) Performed: CATARACT EXTRACTION PHACO AND INTRAOCULAR LENS PLACEMENT ([place]) (Left Eye)  Patient Location: PACU  Anesthesia Type:MAC  Level of Consciousness: awake, alert , oriented and patient cooperative  Airway & Oxygen Therapy: Patient Spontanous Breathing  Post-op Assessment: Report given to RN, Post -op Vital signs reviewed and stable and Patient moving all extremities X 4  Post vital signs: Reviewed and stable  Last Vitals:  Vitals:   09/04/17 0717 09/04/17 0940  BP: (!) 149/76 120/70  Pulse: (!) 116   Resp: 17   Temp: 36.8 C 36.7 C  SpO2: 97% 100%    Last Pain:  Vitals:   09/04/17 0717  TempSrc: Oral         Complications: No apparent anesthesia complications

## 2017-09-04 NOTE — Discharge Instructions (Signed)
Eye Surgery Discharge Instructions  Expect mild scratchy sensation or mild soreness. DO NOT RUB YOUR EYE!  The day of surgery:  Minimal physical activity, but bed rest is not required  No reading, computer work, or close hand work  No bending, lifting, or straining.  May watch TV  For 24 hours:  No driving, legal decisions, or alcoholic beverages  Safety precautions  Eat anything you prefer: It is better to start with liquids, then soup then solid foods.  _____ Eye patch should be worn until postoperative exam tomorrow.  ____ Solar shield eyeglasses should be worn for comfort in the sunlight/patch while sleeping  Resume all regular medications including aspirin or Coumadin if these were discontinued prior to surgery. You may shower, bathe, shave, or wash your hair. Tylenol may be taken for mild discomfort.  Call your doctor if you experience significant pain, nausea, or vomiting, fever > 101 or other signs of infection. [phone] or [phone] Specific instructions:  Follow-up Information    Birder Robson, MD Follow up on 09/05/2017.   Specialty:  Ophthalmology Why:  01:55 Contact information: [address] [place] [address] [phone]

## 2017-09-04 NOTE — Anesthesia Post-op Follow-up Note (Signed)
Anesthesia QCDR form completed.        

## 2017-09-04 NOTE — Anesthesia Preprocedure Evaluation (Addendum)
Anesthesia Evaluation  Patient identified by MRN, date of birth, ID band Patient awake    Reviewed: Allergy & Precautions, NPO status , Patient's Chart, lab work & pertinent test results, reviewed documented beta blocker date and time   History of Anesthesia Complications (+) DIFFICULT AIRWAY and history of anesthetic complications  Airway Mallampati: II  TM Distance: >3 FB     Dental  (+) Chipped   Pulmonary shortness of breath, COPD, Current Smoker,           Cardiovascular + CAD  + dysrhythmias      Neuro/Psych PSYCHIATRIC DISORDERS Anxiety Depression  Neuromuscular disease    GI/Hepatic hiatal hernia, GERD  Controlled,  Endo/Other  Hypothyroidism Hyperthyroidism   Renal/GU      Musculoskeletal  (+) Arthritis ,   Abdominal   Peds  Hematology   Anesthesia Other Findings Hearing aids.Cervical fusion with difficulty extending neck. Difficult intubation. Smokes. Hearing aids.  Reproductive/Obstetrics                            Anesthesia Physical Anesthesia Plan  ASA: III  Anesthesia Plan: MAC   Post-op Pain Management:    Induction:   PONV Risk Score and Plan:   Airway Management Planned:   Additional Equipment:   Intra-op Plan:   Post-operative Plan:   Informed Consent: I have reviewed the patients History and Physical, chart, labs and discussed the procedure including the risks, benefits and alternatives for the proposed anesthesia with the patient or authorized representative who has indicated his/her understanding and acceptance.     Plan Discussed with: CRNA  Anesthesia Plan Comments:         Anesthesia Quick Evaluation

## 2017-09-04 NOTE — Op Note (Signed)
PREOPERATIVE DIAGNOSIS:  Nuclear sclerotic cataract of the left eye.   POSTOPERATIVE DIAGNOSIS:  Nuclear sclerotic cataract of the left eye.   OPERATIVE PROCEDURE: Procedure(s): CATARACT EXTRACTION PHACO AND INTRAOCULAR LENS PLACEMENT (IOC)   SURGEON:  Birder Robson, MD.   ANESTHESIA:  Anesthesiologist: Gunnar Bulla, MD CRNA: Silvana Newness, CRNA  1.      Managed anesthesia care. 2.     0.46ml of Shugarcaine was instilled following the paracentesis   COMPLICATIONS: Viscoelastic was used to raise the pupil margin.  A  Malyugin ring was placed as the pupil would not achieve sufficient pharmacologic dilation to undergo cataract extraction safely.( The ring was removed atraumatically following insertion of the IOL.)    TECHNIQUE:   Stop and chop   DESCRIPTION OF PROCEDURE:  The patient was examined and consented in the preoperative holding area where the aforementioned topical anesthesia was applied to the left eye and then brought back to the Operating Room where the left eye was prepped and draped in the usual sterile ophthalmic fashion and a lid speculum was placed. A paracentesis was created with the side port blade and the anterior chamber was filled with viscoelastic. A near clear corneal incision was performed with the steel keratome. A continuous curvilinear capsulorrhexis was performed with a cystotome followed by the capsulorrhexis forceps. Hydrodissection and hydrodelineation were carried out with BSS on a blunt cannula. The lens was removed in a stop and chop  technique and the remaining cortical material was removed with the irrigation-aspiration handpiece. The capsular bag was inflated with viscoelastic and the Technis [id number] lens was placed in the capsular bag without complication. The remaining viscoelastic was removed from the eye with the irrigation-aspiration handpiece. The wounds were hydrated. The anterior chamber was flushed with Miostat and the eye was inflated to  physiologic pressure. 0.40ml Vigamox was placed in the anterior chamber. The wounds were found to be water tight. The eye was dressed with Vigamox. The patient was given protective glasses to wear throughout the day and a shield with which to sleep tonight. The patient was also given drops with which to begin a drop regimen today and will follow-up with me in one day.  Implant Name Type Inv. Item Serial No. Manufacturer Lot No. LRB No. Used  LENS IOL DIOP 20.5 - [id number] Intraocular Lens LENS IOL DIOP 20.5 [id number] AMO   Left 1    Procedure(s) with comments: CATARACT EXTRACTION PHACO AND INTRAOCULAR LENS PLACEMENT ([facility]) (Left) - [place] 00:34 AP% 17.0 CDE 5.80 Fluid pack lot # [id number]  Electronically signed: [place] 09/04/2017 9:37 AM

## 2017-09-04 NOTE — Anesthesia Postprocedure Evaluation (Signed)
Anesthesia Post Note  Patient: [name]  Procedure(s) Performed: CATARACT EXTRACTION PHACO AND INTRAOCULAR LENS PLACEMENT ([place]) (Left Eye)  Patient location during evaluation: PACU Anesthesia Type: MAC Level of consciousness: awake and alert Pain management: pain level controlled Vital Signs Assessment: post-procedure vital signs reviewed and stable Respiratory status: spontaneous breathing, nonlabored ventilation and respiratory function stable Cardiovascular status: stable and blood pressure returned to baseline Postop Assessment: no apparent nausea or vomiting Anesthetic complications: no     Last Vitals:  Vitals:   09/04/17 0717 09/04/17 0940  BP: (!) 149/76 120/70  Pulse: (!) 116   Resp: 17   Temp: 36.8 C 36.7 C  SpO2: 97% 100%    Last Pain:  Vitals:   09/04/17 0717  TempSrc: Oral                 Silvana Newness A

## 2017-09-05 ENCOUNTER — Encounter: Payer: Medicare HMO | Admitting: Pain Medicine

## 2017-09-26 DIAGNOSIS — Z961 Presence of intraocular lens: Secondary | ICD-10-CM | POA: Diagnosis not present

## 2017-10-01 ENCOUNTER — Ambulatory Visit: Payer: Medicare HMO | Admitting: Nurse Practitioner

## 2017-10-01 DIAGNOSIS — D485 Neoplasm of uncertain behavior of skin: Secondary | ICD-10-CM | POA: Diagnosis not present

## 2017-10-02 ENCOUNTER — Other Ambulatory Visit: Payer: Self-pay

## 2017-10-02 ENCOUNTER — Encounter: Payer: Self-pay | Admitting: Nurse Practitioner

## 2017-10-02 ENCOUNTER — Ambulatory Visit: Payer: Medicare HMO | Attending: Nurse Practitioner | Admitting: Nurse Practitioner

## 2017-10-02 VITALS — BP 122/79 | HR 96 | Temp 98.3°F | Resp 18 | Ht 64.0 in | Wt 110.0 lb

## 2017-10-02 DIAGNOSIS — K219 Gastro-esophageal reflux disease without esophagitis: Secondary | ICD-10-CM | POA: Insufficient documentation

## 2017-10-02 DIAGNOSIS — F1190 Opioid use, unspecified, uncomplicated: Secondary | ICD-10-CM | POA: Diagnosis not present

## 2017-10-02 DIAGNOSIS — M545 Low back pain: Secondary | ICD-10-CM | POA: Insufficient documentation

## 2017-10-02 DIAGNOSIS — F419 Anxiety disorder, unspecified: Secondary | ICD-10-CM | POA: Insufficient documentation

## 2017-10-02 DIAGNOSIS — E782 Mixed hyperlipidemia: Secondary | ICD-10-CM | POA: Insufficient documentation

## 2017-10-02 DIAGNOSIS — M25551 Pain in right hip: Secondary | ICD-10-CM | POA: Insufficient documentation

## 2017-10-02 DIAGNOSIS — M25511 Pain in right shoulder: Secondary | ICD-10-CM | POA: Diagnosis not present

## 2017-10-02 DIAGNOSIS — Z9049 Acquired absence of other specified parts of digestive tract: Secondary | ICD-10-CM | POA: Insufficient documentation

## 2017-10-02 DIAGNOSIS — M542 Cervicalgia: Secondary | ICD-10-CM | POA: Diagnosis not present

## 2017-10-02 DIAGNOSIS — R790 Abnormal level of blood mineral: Secondary | ICD-10-CM

## 2017-10-02 DIAGNOSIS — F329 Major depressive disorder, single episode, unspecified: Secondary | ICD-10-CM | POA: Diagnosis not present

## 2017-10-02 DIAGNOSIS — F17210 Nicotine dependence, cigarettes, uncomplicated: Secondary | ICD-10-CM | POA: Insufficient documentation

## 2017-10-02 DIAGNOSIS — G894 Chronic pain syndrome: Secondary | ICD-10-CM | POA: Insufficient documentation

## 2017-10-02 DIAGNOSIS — M48061 Spinal stenosis, lumbar region without neurogenic claudication: Secondary | ICD-10-CM | POA: Diagnosis not present

## 2017-10-02 DIAGNOSIS — M25512 Pain in left shoulder: Secondary | ICD-10-CM | POA: Diagnosis not present

## 2017-10-02 DIAGNOSIS — Z79891 Long term (current) use of opiate analgesic: Secondary | ICD-10-CM | POA: Diagnosis not present

## 2017-10-02 DIAGNOSIS — I2510 Atherosclerotic heart disease of native coronary artery without angina pectoris: Secondary | ICD-10-CM | POA: Diagnosis not present

## 2017-10-02 DIAGNOSIS — G8929 Other chronic pain: Secondary | ICD-10-CM

## 2017-10-02 DIAGNOSIS — K224 Dyskinesia of esophagus: Secondary | ICD-10-CM | POA: Insufficient documentation

## 2017-10-02 DIAGNOSIS — K449 Diaphragmatic hernia without obstruction or gangrene: Secondary | ICD-10-CM | POA: Insufficient documentation

## 2017-10-02 DIAGNOSIS — E039 Hypothyroidism, unspecified: Secondary | ICD-10-CM | POA: Insufficient documentation

## 2017-10-02 DIAGNOSIS — M47816 Spondylosis without myelopathy or radiculopathy, lumbar region: Secondary | ICD-10-CM | POA: Diagnosis not present

## 2017-10-02 DIAGNOSIS — Z5181 Encounter for therapeutic drug level monitoring: Secondary | ICD-10-CM | POA: Diagnosis not present

## 2017-10-02 DIAGNOSIS — Z79899 Other long term (current) drug therapy: Secondary | ICD-10-CM | POA: Insufficient documentation

## 2017-10-02 DIAGNOSIS — E538 Deficiency of other specified B group vitamins: Secondary | ICD-10-CM | POA: Insufficient documentation

## 2017-10-02 DIAGNOSIS — E785 Hyperlipidemia, unspecified: Secondary | ICD-10-CM | POA: Diagnosis not present

## 2017-10-02 DIAGNOSIS — M7918 Myalgia, other site: Secondary | ICD-10-CM

## 2017-10-02 DIAGNOSIS — J449 Chronic obstructive pulmonary disease, unspecified: Secondary | ICD-10-CM | POA: Diagnosis not present

## 2017-10-02 DIAGNOSIS — R69 Illness, unspecified: Secondary | ICD-10-CM | POA: Diagnosis not present

## 2017-10-02 DIAGNOSIS — E559 Vitamin D deficiency, unspecified: Secondary | ICD-10-CM | POA: Insufficient documentation

## 2017-10-02 DIAGNOSIS — F1990 Other psychoactive substance use, unspecified, uncomplicated: Secondary | ICD-10-CM | POA: Diagnosis not present

## 2017-10-02 DIAGNOSIS — E0590 Thyrotoxicosis, unspecified without thyrotoxic crisis or storm: Secondary | ICD-10-CM | POA: Diagnosis not present

## 2017-10-02 DIAGNOSIS — G5600 Carpal tunnel syndrome, unspecified upper limb: Secondary | ICD-10-CM | POA: Diagnosis not present

## 2017-10-02 MED ORDER — TIZANIDINE HCL 2 MG PO CAPS: 2.0000 mg | ORAL_CAPSULE | Freq: Three times a day (TID) | ORAL | 2 refills | Status: DC | PRN

## 2017-10-02 MED ORDER — OXYCODONE HCL 5 MG PO TABS: 5.0000 mg | ORAL_TABLET | Freq: Four times a day (QID) | ORAL | 0 refills | Status: DC | PRN

## 2017-10-02 NOTE — Progress Notes (Signed)
Patient's Name: [name]  MRN: [id number]  Referring Provider: Albina Billet, MD  DOB: November 04, 1946  PCP: Alison Billet, MD  DOS: 10/02/2017  Note by: Alison Francois NP  Service setting: Ambulatory outpatient  Specialty: Interventional Pain Management  Location: [facility] (AMB) Pain Management Facility    Patient type: Established    Primary Reason(s) for Visit: Encounter for prescription drug management. (Level of risk: moderate)  CC: Back Pain (low); Neck Pain; and Shoulder Pain (bilateral)  HPI  [name] is a 71 y.o. year old, female patient, who comes today for a medication management evaluation. 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); Cervical facet syndrome (Bilateral) (R>L); Cervical myofascial pain syndrome; Lumbar spondylosis; Chronic shoulder impingement syndrome (Right); Low magnesium levels; CRP elevated; Elevated sedimentation rate; Chronic obstructive pulmonary disease (COPD) ([place]); Nicotine dependence; Chronic shoulder pain (Right); Abnormal nerve conduction studies; Chronic shoulder pain (Bilateral) (status post multiple surgeries) (R>L); Cervical facet hypertrophy (Bilateral); History of shoulder surgery 5 (Right); Lumbar foraminal stenosis (L3-4) (Left); 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 Her primarily concern today is the Back Pain (low); Neck Pain; and Shoulder Pain (bilateral)  Pain Assessment: Location: Lower Back Radiating: radiates down left buttock and right butttock Onset: More than a month ago Duration:   Quality: Aching, Constant Severity: 5 /10 (self-reported pain score)  Note: Reported level is compatible with observation.                          Effect on ADL:   Timing: Constant Modifying factors: medication  [name] was last scheduled for an appointment on 05/08/2017 for medication management. During today's appointment we reviewed [name] chronic pain status, as well as her outpatient medication regimen. She admits that her pain is stable. She denies any problems or concerns today. She denies any side effects of her current medication.   The patient  reports that she does not use drugs. Her body mass index is 18.88 kg/m.  Further details on both, my assessment(s), as well as the proposed treatment plan, please see below.  Controlled Substance Pharmacotherapy Assessment REMS (Risk Evaluation and Mitigation Strategy)  Analgesic: Oxycodone 69m QID MME/day: 30 mg/day.  TDewayne Shorter RN  10/02/2017  2:56 PM  Signed Nursing Pain Medication Assessment:  Safety precautions to be maintained throughout the outpatient stay will include: orient to surroundings, keep bed in low position, maintain call bell within reach at all times, provide assistance with transfer out of bed and ambulation.  Medication Inspection Compliance: Pill count conducted under aseptic conditions, in front of the patient. Neither the pills nor the bottle was removed from the patient's sight at any time. Once count was completed pills were immediately  returned to the  patient in their original bottle.  Medication: Oxycodone IR Pill/Patch Count: 1 of 120 pills remain Pill/Patch Appearance: Markings consistent with prescribed medication Bottle Appearance: Standard pharmacy container. Clearly labeled. Filled Date: 10 / 12/ 2018 Last Medication intake:  Today   Pharmacokinetics: Liberation and absorption (onset of action): WNL Distribution (time to peak effect): WNL Metabolism and excretion (duration of action): WNL         Pharmacodynamics: Desired effects: Analgesia: [name] reports >50% benefit. Functional ability: Patient reports that medication allows her to accomplish basic ADLs Clinically meaningful improvement in function (CMIF): Sustained CMIF goals met Perceived effectiveness: Described as relatively effective, allowing for increase in activities of daily living (ADL) Undesirable effects: Side-effects or Adverse reactions: None reported Monitoring: [place] PMP: Online review of the past 35-monthperiod conducted. Compliant with practice rules and regulations Last UDS on record: Summary  Date Value Ref Range Status  06/05/2017 FINAL  Final    Comment:    ==================================================================== TOXASSURE SELECT 13 (MW) ==================================================================== Test                             Result       Flag       Units Drug Present and Declared for Prescription Verification   Alprazolam                     328          EXPECTED   ng/mg creat   Alpha-hydroxyalprazolam        1837         EXPECTED   ng/mg creat    Source of alprazolam is a scheduled prescription medication.    Alpha-hydroxyalprazolam is an expected metabolite of alprazolam.   Morphine                       162          EXPECTED   ng/mg creat    Potential sources of morphine include administration of codeine    or morphine, use of heroin, or ingestion of poppy seeds. Drug Present not Declared for  Prescription Verification   Hydrocodone                    2096         UNEXPECTED ng/mg creat   Hydromorphone                  637          UNEXPECTED ng/mg creat   Dihydrocodeine                 200          UNEXPECTED ng/mg creat   Norhydrocodone                 2725         UNEXPECTED ng/mg creat    Sources of hydrocodone include scheduled prescription    medications. Dihydrocodeine and norhydrocodone are expected    metabolites of hydrocodone. Dihydrocodeine is also available as a    scheduled prescription medication.    Hydromorphone may be administered as a scheduled prescription    medication and is also an expected metabolite of hydrocodone and    a minor metabolite of morphine. Hydrocodone and/or its    metabolites and morphine are also present in this specimen. Drug Absent but Declared for Prescription Verification  Oxycodone                      Not Detected UNEXPECTED ng/mg creat ==================================================================== Test                      Result    Flag   Units      Ref Range   Creatinine              81               mg/dL      >=20 ==================================================================== Declared Medications:  The flagging and interpretation on this report are based on the  following declared medications.  Unexpected results may arise from  inaccuracies in the declared medications.  **Note: The testing scope of this panel includes these medications:  Alprazolam  Morphine (Morphine Sulfate)  Oxycodone  **Note: The testing scope of this panel does not include following  reported medications:  Albuterol (Combivent)  Bupropion  Cyanocobalamin  Docusate (Senokot-S)  Escitalopram  Fluticasone (Advair)  Ipratropium (Combivent)  Levothyroxine  Magnesium Oxide  Pantoprazole  Salmeterol (Advair)  Sennosides (Senokot-S)  Tizanidine  Vitamin D3 ==================================================================== For  clinical consultation, please call [phone]. ====================================================================    UDS interpretation: Compliant          Medication Assessment Form: Reviewed. Patient indicates being compliant with therapy Treatment compliance: Compliant Risk Assessment Profile: Aberrant behavior: See prior evaluations. None observed or detected today Comorbid factors increasing risk of overdose: See prior notes. No additional risks detected today Risk of substance use disorder (SUD): Low Opioid Risk Tool - 08/29/17 1419      Family History of Substance Abuse   Alcohol  Negative    Illegal Drugs  Negative    Rx Drugs  Negative      Personal History of Substance Abuse   Alcohol  Negative    Illegal Drugs  Negative    Rx Drugs  Negative      Age   Age between 36-45 years   No      History of Preadolescent Sexual Abuse   History of Preadolescent Sexual Abuse  Negative or Female      Psychological Disease   Psychological Disease  Negative    Depression  Negative      Total Score   Opioid Risk Tool Scoring  0    Opioid Risk Interpretation  Low Risk      ORT Scoring interpretation table:  Score <3 = Low Risk for SUD  Score between 4-7 = Moderate Risk for SUD  Score >8 = High Risk for Opioid Abuse   Risk Mitigation Strategies:  Patient Counseling: Covered Patient-Prescriber Agreement (PPA): Present and active  Notification to other healthcare providers: Done  Pharmacologic Plan: No change in therapy, at this time  Laboratory Chemistry  Inflammation Markers (CRP: Acute Phase) (ESR: Chronic Phase) Lab Results  Component Value Date   CRP 14.3 (H) 06/05/2017   ESRSEDRATE 9 06/05/2017                 Renal Function Markers Lab Results  Component Value Date   BUN 14 06/05/2017   CREATININE 0.68 06/05/2017   GFRAA 102 06/05/2017   GFRNONAA 89 06/05/2017                 Hepatic Function Markers Lab Results  Component Value Date   AST 17  06/05/2017   ALT 14 06/05/2017   ALBUMIN 3.6  06/05/2017   ALKPHOS 124 (H) 06/05/2017                 Electrolytes Lab Results  Component Value Date   NA 144 06/05/2017   K 4.0 06/05/2017   CL 104 06/05/2017   CALCIUM 9.0 06/05/2017   MG 1.7 06/05/2017                 Neuropathy Markers Lab Results  Component Value Date   VITAMINB12 282 06/05/2017                 Bone Pathology Markers Lab Results  Component Value Date   ALKPHOS 124 (H) 06/05/2017   25OHVITD1 28 (L) 06/05/2017   25OHVITD2 3.0 06/05/2017   25OHVITD3 25 06/05/2017   CALCIUM 9.0 06/05/2017                 Rheumatology Markers Lab Results  Component Value Date   LABURIC 3.7 10/27/2008                Coagulation Parameters Lab Results  Component Value Date   INR 1.08 10/01/2016   LABPROT 14.0 10/01/2016   PLT 405 02/20/2017                 Cardiovascular Markers Lab Results  Component Value Date   CKTOTAL 372 (H) 10/01/2016   TROPONINI <0.03 10/01/2016   HGB 11.5 (L) 02/20/2017   HCT 35.0 02/20/2017                 CA Markers No results found for: CEA, CA125, LABCA2               Note: Lab results reviewed.  Recent Diagnostic Imaging Results  DG Lumbar Spine Complete W/Bend CLINICAL DATA:  71 year old female fractured hip in November with low back pain since. Initial encounter.  EXAM: LUMBAR SPINE - COMPLETE WITH BENDING VIEWS  COMPARISON:  02/10/2014.  FINDINGS: New from the 2015 lumbar spine plain film examination are T11 and T12 superior endplate compression fractures with 25-30% loss height. There is also a new L5 superior endplate compression fracture with 20% loss of height. Please see MR lumbar spine report performed same date and dictated separately.  Facet degenerative changes lumbar spine with minimal anterior slip L3. No abnormal motion between flexion and extension.  No pars defect.  Mild curvature lower thoracic lumbar spine convex right.  Vascular  calcifications.  IMPRESSION: New from the 2015 lumbar spine plain film examination are T11 and T12 superior endplate compression fractures with 25-30% loss height. There is also a new L5 superior endplate compression fracture with 20% loss of height. Please see MR lumbar spine report performed same date and dictated separately.  Facet degenerative changes lumbar spine with minimal anterior slip L3.  No abnormal motion between flexion and extension.  Mild curvature lower thoracic lumbar spine convex right.  Aortic Atherosclerosis (ICD10-I70.0).  Electronically Signed   By: Genia Del M.D.   On: 06/18/2017 20:04 MR LUMBAR SPINE W WO CONTRAST CLINICAL DATA:  Low back pain radiating into the right leg.  EXAM: MRI LUMBAR SPINE WITHOUT AND WITH CONTRAST  TECHNIQUE: Multiplanar and multiecho pulse sequences of the lumbar spine were obtained without and with intravenous contrast.  CONTRAST:  48m MULTIHANCE GADOBENATE DIMEGLUMINE 529 MG/ML IV SOLN  COMPARISON:  Lumbar spine x-rays dated February 10, 2014. MRI lumbar spine report dated December 15, 2010.  FINDINGS: Segmentation:  Standard.  Alignment:  Trace anterolisthesis of L3 on  L4, unchanged.  Vertebrae: Mild increased edema and enhancement within the superior endplate of K27, consistent with subacute compression fracture. There is approximately 25% height loss. No retropulsion. Chronic central superior endplate deformity of the T12 vertebral body. No evidence of discitis or focal bone lesion.  Conus medullaris: Extends to the L1 level and appears normal. No abnormal enhancement identified.  Paraspinal and other soft tissues: Bilateral renal cysts. Otherwise negative.  Disc levels:  T12-L1:  Normal.  L1-L2:  Normal.  L2-L3: Small broad-based disc bulge without spinal canal or neuroforaminal stenosis.  L3-L4: Small posterior disc bulge and bilateral facet arthropathy resulting in mild central spinal canal  stenosis and moderate left neuroforaminal stenosis, unchanged. No right neuroforaminal stenosis.  L4-L5: Diffuse disc bulge and bilateral facet arthropathy resulting in moderate central spinal canal stenosis, mild narrowing of the bilateral lateral recesses, and moderate left and mild right neuroforaminal stenosis.  L5-S1: Small diffuse disc bulge and mild bilateral facet arthropathy without spinal canal or neuroforaminal stenosis.  IMPRESSION: 1. Subacute compression deformity of the T11 vertebral body with approximately 25% height loss. 2. Central superior endplate compression deformity of the T12 vertebral body, new from prior studies, but chronic in appearance. 3. Worsened degenerative disc disease at L4-L5 where there is now moderate central spinal canal stenosis and moderate left neuroforaminal stenosis. 4. Unchanged mild central spinal canal stenosis and moderate left neuroforaminal stenosis at L3-L4.  Electronically Signed   By: Titus Dubin M.D.   On: 06/18/2017 17:30  Complexity Note: Imaging results reviewed. Results shared with Ms. Mustin, using Layman's terms.                         Meds   Current Outpatient Medications:  .  ALPRAZolam (XANAX) 1 MG tablet, Take 1 tablet (1 mg total) by mouth 4 (four) times daily as needed. (Patient taking differently: Take 1 mg by mouth 2 (two) times daily as needed. ), Disp: 30 tablet, Rfl: 0 .  buPROPion (WELLBUTRIN SR) 150 MG 12 hr tablet, Take 150 mg by mouth daily. , Disp: , Rfl:  .  Cholecalciferol (VITAMIN D3) 5000 units TABS, Take 1 tablet (5,000 Units total) by mouth daily., Disp: 30 tablet, Rfl: 2 .  escitalopram (LEXAPRO) 10 MG tablet, Take 10 mg by mouth daily. , Disp: , Rfl:  .  Fluticasone-Salmeterol (ADVAIR) 100-50 MCG/DOSE AEPB, Inhale 1 puff into the lungs every 12 (twelve) hours., Disp: , Rfl:  .  Ipratropium-Albuterol (COMBIVENT RESPIMAT) 20-100 MCG/ACT AERS respimat, Inhale 1 puff into the lungs every 6 (six)  hours as needed for wheezing or shortness of breath., Disp: , Rfl:  .  levothyroxine (SYNTHROID, LEVOTHROID) 88 MCG tablet, Take 88 mcg by mouth daily before breakfast. , Disp: , Rfl:  .  traZODone (DESYREL) 150 MG tablet, Take 300 mg by mouth at bedtime. , Disp: , Rfl:  .  magnesium oxide (MAG-OX) 400 MG tablet, Take 1 tablet (400 mg total) by mouth 2 (two) times daily., Disp: 180 tablet, Rfl: 0 .  [START ON 12/01/2017] oxyCODONE (OXY IR/ROXICODONE) 5 MG immediate release tablet, Take 1 tablet (5 mg total) every 6 (six) hours as needed by mouth for severe pain., Disp: 120 tablet, Rfl: 0 .  [START ON 11/01/2017] oxyCODONE (OXY IR/ROXICODONE) 5 MG immediate release tablet, Take 1 tablet (5 mg total) every 6 (six) hours as needed by mouth for severe pain., Disp: 120 tablet, Rfl: 0 .  oxyCODONE (OXY IR/ROXICODONE) 5 MG immediate release  tablet, Take 1 tablet (5 mg total) every 6 (six) hours as needed by mouth for severe pain. Max: 1/day, Disp: 7 tablet, Rfl: 0 .  tizanidine (ZANAFLEX) 2 MG capsule, Take 1 capsule (2 mg total) 3 (three) times daily as needed by mouth for muscle spasms., Disp: 90 capsule, Rfl: 2  ROS  Constitutional: Denies any fever or chills Gastrointestinal: No reported hemesis, hematochezia, vomiting, or acute GI distress Musculoskeletal: Denies any acute onset joint swelling, redness, loss of ROM, or weakness Neurological: No reported episodes of acute onset apraxia, aphasia, dysarthria, agnosia, amnesia, paralysis, loss of coordination, or loss of consciousness  Allergies  Ms. Steenson has No Known Allergies.  Speculator  Drug: Ms. Bega  reports that she does not use drugs. Alcohol:  reports that she does not drink alcohol. Tobacco:  reports that she has been smoking cigarettes.  She has been smoking about 0.50 packs per day. she has never used smokeless tobacco. Medical:  has a past medical history of Anxiety, CAD (coronary artery disease), Carpal tunnel syndrome, Cataract, Complication  of anesthesia, COPD (chronic obstructive pulmonary disease) ([place]), CRP elevated (09/14/2015), Depression, Difficult intubation, Dyslipidemia, Dyspnea, Dysrhythmia, Elevated sedimentation rate (09/14/2015), Esophageal spasm, Gastrointestinal parasites, GERD (gastroesophageal reflux disease), Hiatal hernia, History of peptic ulcer disease, Hyperthyroidism, Hypothyroidism, Low magnesium levels (09/14/2015), Pelvic fracture ([place]) (2008), Reflux, Rotator cuff injury, and Stenosis, spinal, lumbar. Surgical: Ms. Luse  has a past surgical history that includes Cesarean section; Neck surgery; Cholecystectomy; Carpal tunnel release; Rotator cuff repair; Shoulder surgery (12/07/2011); Fracture surgery; Appendectomy; Nose surgery; Back surgery; Hip surgery; CATARACT EXTRACTION PHACO AND INTRAOCULAR LENS PLACEMENT (IOC) (Left, 09/04/2017); CATARACT EXTRACTION PHACO AND INTRAOCULAR LENS PLACEMENT (IOC) (Right, 08/07/2017); INTRAMEDULLARY (IM) NAIL INTERTROCHANTRIC (Right, 10/01/2016); and ARTHROSCOPY SHOULDER (Right, 12/07/2011). Family: family history includes Aneurysm in her unknown relative.  Constitutional Exam  General appearance: Well nourished, well developed, and well hydrated. In no apparent acute distress Vitals:   10/02/17 1450  BP: 122/79  Pulse: 96  Resp: 18  Temp: 98.3 F (36.8 C)  SpO2: 98%  Weight: 110 lb (49.9 kg)  Height: _0  (1.626 m)   BMI Assessment: Estimated body mass index is 18.88 kg/m as calculated from the following:   Height as of this encounter: _1  (1.626 m).   Weight as of this encounter: 110 lb (49.9 kg). Psych/Mental status: Alert, oriented x 3 (person, place, & time)       Eyes: PERLA Respiratory: No evidence of acute respiratory distress  Cervical Spine Area Exam  Skin & Axial Inspection: No masses, redness, edema, swelling, or associated skin lesions Alignment: Symmetrical Functional ROM: Unrestricted ROM      Stability: No instability detected Muscle  Tone/Strength: Functionally intact. No obvious neuro-muscular anomalies detected. Sensory (Neurological): Unimpaired Palpation: No palpable anomalies              Upper Extremity (UE) Exam    Side: Right upper extremity  Side: Left upper extremity  Skin & Extremity Inspection: Skin color, temperature, and hair growth are WNL. No peripheral edema or cyanosis. No masses, redness, swelling, asymmetry, or associated skin lesions. No contractures.  Skin & Extremity Inspection: Skin color, temperature, and hair growth are WNL. No peripheral edema or cyanosis. No masses, redness, swelling, asymmetry, or associated skin lesions. No contractures.  Functional ROM: Unrestricted ROM          Functional ROM: Unrestricted ROM          Muscle Tone/Strength: Functionally intact. No obvious neuro-muscular anomalies detected.  Muscle Tone/Strength: Functionally intact. No obvious neuro-muscular anomalies detected.  Sensory (Neurological): Unimpaired          Sensory (Neurological): Unimpaired          Palpation: No palpable anomalies              Palpation: No palpable anomalies              Specialized Test(s): Deferred         Specialized Test(s): Deferred          Thoracic Spine Area Exam  Skin & Axial Inspection: No masses, redness, or swelling Alignment: Symmetrical Functional ROM: Unrestricted ROM Stability: No instability detected Muscle Tone/Strength: Functionally intact. No obvious neuro-muscular anomalies detected. Sensory (Neurological): Unimpaired Muscle strength & Tone: No palpable anomalies  Lumbar Spine Area Exam  Skin & Axial Inspection: No masses, redness, or swelling Alignment: Symmetrical Functional ROM: Unrestricted ROM      Stability: No instability detected Muscle Tone/Strength: Functionally intact. No obvious neuro-muscular anomalies detected. Sensory (Neurological): Unimpaired Palpation: No palpable anomalies       Provocative Tests: Lumbar Hyperextension and rotation test:  evaluation deferred today       Lumbar Lateral bending test: evaluation deferred today       Patrick's Maneuver: evaluation deferred today                    Gait & Posture Assessment  Ambulation: Unassisted Gait: Relatively normal for age and body habitus Posture: WNL   Lower Extremity Exam    Side: Right lower extremity  Side: Left lower extremity  Skin & Extremity Inspection: Skin color, temperature, and hair growth are WNL. No peripheral edema or cyanosis. No masses, redness, swelling, asymmetry, or associated skin lesions. No contractures.  Skin & Extremity Inspection: Skin color, temperature, and hair growth are WNL. No peripheral edema or cyanosis. No masses, redness, swelling, asymmetry, or associated skin lesions. No contractures.  Functional ROM: Unrestricted ROM          Functional ROM: Unrestricted ROM          Muscle Tone/Strength: Functionally intact. No obvious neuro-muscular anomalies detected.  Muscle Tone/Strength: Functionally intact. No obvious neuro-muscular anomalies detected.  Sensory (Neurological): Unimpaired  Sensory (Neurological): Unimpaired  Palpation: No palpable anomalies  Palpation: No palpable anomalies   Assessment  Primary Diagnosis & Pertinent Problem List: The primary encounter diagnosis was Chronic neck pain (Secondary source of pain) (Bilateral) (R>L). Diagnoses of Lumbar facet syndrome (Right), Chronic shoulder pain (Bilateral) (status post multiple surgeries) (R>L), Chronic pain syndrome, Myofascial pain syndrome, cervical, Low magnesium levels, Opiate use, and Substance use disorder Risk: High were also pertinent to this visit.  Status Diagnosis  Controlled Controlled Controlled 1. Chronic neck pain (Secondary source of pain) (Bilateral) (R>L)   2. Lumbar facet syndrome (Right)   3. Chronic shoulder pain (Bilateral) (status post multiple surgeries) (R>L)   4. Chronic pain syndrome   5. Myofascial pain syndrome, cervical   6. Low magnesium levels    7. Opiate use   8. Substance use disorder Risk: High     Problems updated and reviewed during this visit: No problems updated. Plan of Care  Pharmacotherapy (Medications Ordered): Meds ordered this encounter  Medications  . oxyCODONE (OXY IR/ROXICODONE) 5 MG immediate release tablet    Sig: Take 1 tablet (5 mg total) every 6 (six) hours as needed by mouth for severe pain.    Dispense:  120 tablet    Refill:  0  Do not place this medication, or any other prescription from our practice, on "Automatic Refill". Patient may have prescription filled one day early if pharmacy is closed on scheduled refill date. Do not fill until: 12/01/2017 To last until: 12/31/2017    Order Specific Question:   Supervising Provider    Answer:   Milinda Pointer [phone]  . tizanidine (ZANAFLEX) 2 MG capsule    Sig: Take 1 capsule (2 mg total) 3 (three) times daily as needed by mouth for muscle spasms.    Dispense:  90 capsule    Refill:  2    Do not place this medication, or any other prescription from our practice, on "Automatic Refill". Patient may have prescription filled one day early if pharmacy is closed on scheduled refill date.    Order Specific Question:   Supervising Provider    Answer:   Milinda Pointer [phone]  . oxyCODONE (OXY IR/ROXICODONE) 5 MG immediate release tablet    Sig: Take 1 tablet (5 mg total) every 6 (six) hours as needed by mouth for severe pain.    Dispense:  120 tablet    Refill:  0    Do not place this medication, or any other prescription from our practice, on "Automatic Refill". Patient may have prescription filled one day early if pharmacy is closed on scheduled refill date. Do not fill until: 11/01/2017 To last until: 12/01/2017    Order Specific Question:   Supervising Provider    Answer:   Milinda Pointer [phone]  . oxyCODONE (OXY IR/ROXICODONE) 5 MG immediate release tablet    Sig: Take 1 tablet (5 mg total) every 6 (six) hours as needed by mouth for severe  pain. Max: 1/day    Dispense:  7 tablet    Refill:  0    This prescription is part of a downward opioid taper. Fill instructions must be followed exactly as written to avoid withdrawal. Fill date: 10/02/2017 To last until: 11/01/2017    Order Specific Question:   Supervising Provider    Answer:   Milinda Pointer [phone]  This SmartLink is deprecated. Use AVSMEDLIST instead to display the medication list for a patient. Medications administered today: Darrol Poke. Godwin had no medications administered during this visit. Lab-work, procedure(s), and/or referral(s): No orders of the defined types were placed in this encounter.  Imaging and/or referral(s): None  Interventional management options: Planned, scheduled, and/or pending:   Not at this time.   Considering:   Diagnostic right-sidedlumbarfacet + right-sided sacroiliac joint block#2 Diagnostic right-sided sacroiliac joint block  Possible right sided sacroiliac joint RFA Diagnostic right-sided lumbar facet block  Possible right-sided lumbar facet RFA Diagnostic bilateral cervical facetblock  Possible bilateral cervical facet radiofrequencyablation  Diagnostic right-sided cervical epidural steroid injection  Diagnostic bilateral intra-articular shoulderjoint injection  Diagnostic bilateral suprascapularnerve block  Possible bilateral suprascapular nerve radiofrequencyablation  Diagnostic left-sided L3-4 transforaminal epiduralsteroid injection  Diagnostic L3-4 versus L4-5 lumbar epiduralsteroid injection    Palliative PRN treatment(s):   Palliative right-sided sacroiliac joint block Palliative right-sided lumbar facet block      Provider-requested follow-up: Return in about 3 months (around 01/02/2018) for MedMgmt.  Future Appointments  Date Time Provider [place]  12/31/2017  1:30 PM Alison Francois, NP [facility] None   Primary Care Physician: Alison Billet, MD Location: [facility] Note by: Alison Francois NP Date: 10/02/2017; Time: 8:42 AM  Pain Score Disclaimer: We use the NRS-11 scale. This is a self-reported, subjective measurement of pain  severity with only modest accuracy. It is used primarily to identify changes within a particular patient. It must be understood that outpatient pain scales are significantly less accurate that those used for research, where they can be applied under ideal controlled circumstances with minimal exposure to variables. In reality, the score is likely to be a combination of pain intensity and pain affect, where pain affect describes the degree of emotional arousal or changes in action readiness caused by the sensory experience of pain. Factors such as social and work situation, setting, emotional state, anxiety levels, expectation, and prior pain experience may influence pain perception and show large inter-individual differences that may also be affected by time variables.  Patient instructions provided during this appointment: Patient Instructions   ____________________You have been given 3 scripts for oxycodone today. ________________________________________________________________________  Medication Rules  Applies to: All patients receiving prescriptions (written or electronic).  Pharmacy of record: Pharmacy where electronic prescriptions will be sent. If written prescriptions are taken to a different pharmacy, please inform the nursing staff. The pharmacy listed in the electronic medical record should be the one where you would like electronic prescriptions to be sent.  Prescription refills: Only during scheduled appointments. Applies to both, written and electronic prescriptions.  NOTE: The following applies primarily to controlled substances (Opioid* Pain Medications).   Patient's responsibilities: 1. Pain Pills: Bring all pain pills to every appointment (except for procedure appointments). 2. Pill Bottles:  Bring pills in original pharmacy bottle. Always bring newest bottle. Bring bottle, even if empty. 3. Medication refills: You are responsible for knowing and keeping track of what medications you need refilled. The day before your appointment, write a list of all prescriptions that need to be refilled. Bring that list to your appointment and give it to the admitting nurse. Prescriptions will be written only during appointments. If you forget a medication, it will not be "Called in", "Faxed", or "electronically sent". You will need to get another appointment to get these prescribed. 4. Prescription Accuracy: You are responsible for carefully inspecting your prescriptions before leaving our office. Have the discharge nurse carefully go over each prescription with you, before taking them home. Make sure that your name is accurately spelled, that your address is correct. Check the name and dose of your medication to make sure it is accurate. Check the number of pills, and the written instructions to make sure they are clear and accurate. Make sure that you are given enough medication to last until your next medication refill appointment. 5. Taking Medication: Take medication as prescribed. Never take more pills than instructed. Never take medication more frequently than prescribed. Taking less pills or less frequently is permitted and encouraged, when it comes to controlled substances (written prescriptions).  6. Inform other Doctors: Always inform, all of your healthcare providers, of all the medications you take. 7. Pain Medication from other Providers: You are not allowed to accept any additional pain medication from any other Doctor or Healthcare provider. There are two exceptions to this rule. (see below) In the event that you require additional pain medication, you are responsible for notifying us, as stated below. 8. Medication Agreement: You are responsible for carefully reading and following our Medication  Agreement. This must be signed before receiving any prescriptions from our practice. Safely store a copy of your signed Agreement. Violations to the Agreement will result in no further prescriptions. (Additional copies of our Medication Agreement are available upon request.) 9. Laws, Rules, & Regulations: All patients are expected to  follow all Federal and [employer], Rules, & Regulations. Ignorance of the Laws does not constitute a valid excuse. The use of any illegal substances is prohibited. 10. Adopted CDC guidelines & recommendations: Target dosing levels will be at or below 60 MME/day. Use of benzodiazepines** is not recommended.  Exceptions: There are only two exceptions to the rule of not receiving pain medications from other Healthcare Providers. 1. Exception #1 (Emergencies): In the event of an emergency (i.e.: accident requiring emergency care), you are allowed to receive additional pain medication. However, you are responsible for: As soon as you are able, call our office [phone], at any time of the day or night, and leave a message stating your name, the date and nature of the emergency, and the name and dose of the medication prescribed. In the event that your call is answered by a member of our staff, make sure to document and save the date, time, and the name of the person that took your information.  2. Exception #2 (Planned Surgery): In the event that you are scheduled by another doctor or dentist to have any type of surgery or procedure, you are allowed (for a period no longer than 30 days), to receive additional pain medication, for the acute post-op pain. However, in this case, you are responsible for picking up a copy of our "Post-op Pain Management for Surgeons" handout, and giving it to your surgeon or dentist. This document is available at our office, and does not require an appointment to obtain it. Simply go to our office during business hours (Monday-Thursday from  8:00 AM to 4:00 PM) (Friday 8:00 AM to 12:00 Noon) or if you have a scheduled appointment with [place], prior to your surgery, and ask for it by name. In addition, you will need to provide [place] with your name, name of your surgeon, type of surgery, and date of procedure or surgery.  *Opioid medications include: morphine, codeine, oxycodone, oxymorphone, hydrocodone, hydromorphone, meperidine, tramadol, tapentadol, buprenorphine, fentanyl, methadone. **Benzodiazepine medications include: diazepam (Valium), alprazolam (Xanax), clonazepam (Klonopine), lorazepam (Ativan), clorazepate (Tranxene), chlordiazepoxide (Librium), estazolam (Prosom), oxazepam (Serax), temazepam (Restoril), triazolam (Halcion)  ____________________________________________________________________________________________

## 2017-10-02 NOTE — Progress Notes (Signed)
Nursing Pain Medication Assessment:  Safety precautions to be maintained throughout the outpatient stay will include: orient to surroundings, keep bed in low position, maintain call bell within reach at all times, provide assistance with transfer out of bed and ambulation.  Medication Inspection Compliance: Pill count conducted under aseptic conditions, in front of the patient. Neither the pills nor the bottle was removed from the patient's sight at any time. Once count was completed pills were immediately returned to the patient in their original bottle.  Medication: Oxycodone IR Pill/Patch Count: 1 of 120 pills remain Pill/Patch Appearance: Markings consistent with prescribed medication Bottle Appearance: Standard pharmacy container. Clearly labeled. Filled Date: 10 / 12/ 2018 Last Medication intake:  Today

## 2017-10-02 NOTE — Patient Instructions (Addendum)
____________________You have been given 3 scripts for oxycodone today. ________________________________________________________________________  Medication Rules  Applies to: All patients receiving prescriptions (written or electronic).  Pharmacy of record: Pharmacy where electronic prescriptions will be sent. If written prescriptions are taken to a different pharmacy, please inform the nursing staff. The pharmacy listed in the electronic medical record should be the one where you would like electronic prescriptions to be sent.  Prescription refills: Only during scheduled appointments. Applies to both, written and electronic prescriptions.  NOTE: The following applies primarily to controlled substances (Opioid* Pain Medications).   Patient's responsibilities: 1. Pain Pills: Bring all pain pills to every appointment (except for procedure appointments). 2. Pill Bottles: Bring pills in original pharmacy bottle. Always bring newest bottle. Bring bottle, even if empty. 3. Medication refills: You are responsible for knowing and keeping track of what medications you need refilled. The day before your appointment, write a list of all prescriptions that need to be refilled. Bring that list to your appointment and give it to the admitting nurse. Prescriptions will be written only during appointments. If you forget a medication, it will not be "Called in", "Faxed", or "electronically sent". You will need to get another appointment to get these prescribed. 4. Prescription Accuracy: You are responsible for carefully inspecting your prescriptions before leaving our office. Have the discharge nurse carefully go over each prescription with you, before taking them home. Make sure that your name is accurately spelled, that your address is correct. Check the name and dose of your medication to make sure it is accurate. Check the number of pills, and the written instructions to make sure they are clear and accurate.  Make sure that you are given enough medication to last until your next medication refill appointment. 5. Taking Medication: Take medication as prescribed. Never take more pills than instructed. Never take medication more frequently than prescribed. Taking less pills or less frequently is permitted and encouraged, when it comes to controlled substances (written prescriptions).  6. Inform other Doctors: Always inform, all of your healthcare providers, of all the medications you take. 7. Pain Medication from other Providers: You are not allowed to accept any additional pain medication from any other Doctor or Healthcare provider. There are two exceptions to this rule. (see below) In the event that you require additional pain medication, you are responsible for notifying us, as stated below. 8. Medication Agreement: You are responsible for carefully reading and following our Medication Agreement. This must be signed before receiving any prescriptions from our practice. Safely store a copy of your signed Agreement. Violations to the Agreement will result in no further prescriptions. (Additional copies of our Medication Agreement are available upon request.) 9. Laws, Rules, & Regulations: All patients are expected to follow all Federal and [employer], Rules, [employer]. Ignorance of the Laws does not constitute a valid excuse. The use of any illegal substances is prohibited. 10. Adopted CDC guidelines & recommendations: Target dosing levels will be at or below 60 MME/day. Use of benzodiazepines** is not recommended.  Exceptions: There are only two exceptions to the rule of not receiving pain medications from other Healthcare Providers. 1. Exception #1 (Emergencies): In the event of an emergency (i.e.: accident requiring emergency care), you are allowed to receive additional pain medication. However, you are responsible for: As soon as you are able, call our office [phone], at any time of the  day or night, and leave a message stating your name, the date and nature of the  emergency, and the name and dose of the medication prescribed. In the event that your call is answered by a member of our staff, make sure to document and save the date, time, and the name of the person that took your information.  2. Exception #2 (Planned Surgery): In the event that you are scheduled by another doctor or dentist to have any type of surgery or procedure, you are allowed (for a period no longer than 30 days), to receive additional pain medication, for the acute post-op pain. However, in this case, you are responsible for picking up a copy of our "Post-op Pain Management for Surgeons" handout, and giving it to your surgeon or dentist. This document is available at our office, and does not require an appointment to obtain it. Simply go to our office during business hours (Monday-Thursday from 8:00 AM to 4:00 PM) (Friday 8:00 AM to 12:00 Noon) or if you have a scheduled appointment with [place], prior to your surgery, and ask for it by name. In addition, you will need to provide [place] with your name, name of your surgeon, type of surgery, and date of procedure or surgery.  *Opioid medications include: morphine, codeine, oxycodone, oxymorphone, hydrocodone, hydromorphone, meperidine, tramadol, tapentadol, buprenorphine, fentanyl, methadone. **Benzodiazepine medications include: diazepam (Valium), alprazolam (Xanax), clonazepam (Klonopine), lorazepam (Ativan), clorazepate (Tranxene), chlordiazepoxide (Librium), estazolam (Prosom), oxazepam (Serax), temazepam (Restoril), triazolam (Halcion)  ____________________________________________________________________________________________

## 2017-10-03 ENCOUNTER — Telehealth: Payer: Self-pay | Admitting: *Deleted

## 2017-10-03 ENCOUNTER — Other Ambulatory Visit: Payer: Self-pay | Admitting: Nurse Practitioner

## 2017-10-03 DIAGNOSIS — G894 Chronic pain syndrome: Secondary | ICD-10-CM

## 2017-10-03 MED ORDER — OXYCODONE HCL 5 MG PO TABS: 5.0000 mg | ORAL_TABLET | Freq: Four times a day (QID) | ORAL | 0 refills | Status: DC | PRN

## 2017-10-03 NOTE — Telephone Encounter (Signed)
Patient informed that we had only written for 7 tablets.  We will rewrite for 113 more.  Informed her to come by and pick it up.  Patient states she would come tomorrow.

## 2017-10-23 DIAGNOSIS — D485 Neoplasm of uncertain behavior of skin: Secondary | ICD-10-CM | POA: Diagnosis not present

## 2017-10-23 DIAGNOSIS — D229 Melanocytic nevi, unspecified: Secondary | ICD-10-CM | POA: Diagnosis not present

## 2017-12-12 DIAGNOSIS — M5134 Other intervertebral disc degeneration, thoracic region: Secondary | ICD-10-CM | POA: Diagnosis not present

## 2017-12-12 DIAGNOSIS — E038 Other specified hypothyroidism: Secondary | ICD-10-CM | POA: Diagnosis not present

## 2017-12-12 DIAGNOSIS — J449 Chronic obstructive pulmonary disease, unspecified: Secondary | ICD-10-CM | POA: Diagnosis not present

## 2017-12-12 DIAGNOSIS — I10 Essential (primary) hypertension: Secondary | ICD-10-CM | POA: Diagnosis not present

## 2017-12-12 DIAGNOSIS — E039 Hypothyroidism, unspecified: Secondary | ICD-10-CM | POA: Diagnosis not present

## 2017-12-31 ENCOUNTER — Other Ambulatory Visit: Payer: Self-pay

## 2017-12-31 ENCOUNTER — Ambulatory Visit: Payer: Medicare HMO | Attending: Nurse Practitioner | Admitting: Nurse Practitioner

## 2017-12-31 ENCOUNTER — Encounter: Payer: Self-pay | Admitting: Nurse Practitioner

## 2017-12-31 VITALS — BP 161/89 | HR 116 | Temp 98.2°F | Resp 18 | Ht 64.0 in | Wt 120.0 lb

## 2017-12-31 DIAGNOSIS — Z79899 Other long term (current) drug therapy: Secondary | ICD-10-CM | POA: Insufficient documentation

## 2017-12-31 DIAGNOSIS — E538 Deficiency of other specified B group vitamins: Secondary | ICD-10-CM | POA: Insufficient documentation

## 2017-12-31 DIAGNOSIS — E559 Vitamin D deficiency, unspecified: Secondary | ICD-10-CM | POA: Diagnosis not present

## 2017-12-31 DIAGNOSIS — K449 Diaphragmatic hernia without obstruction or gangrene: Secondary | ICD-10-CM | POA: Insufficient documentation

## 2017-12-31 DIAGNOSIS — R790 Abnormal level of blood mineral: Secondary | ICD-10-CM | POA: Diagnosis not present

## 2017-12-31 DIAGNOSIS — M549 Dorsalgia, unspecified: Secondary | ICD-10-CM | POA: Diagnosis not present

## 2017-12-31 DIAGNOSIS — M533 Sacrococcygeal disorders, not elsewhere classified: Secondary | ICD-10-CM | POA: Diagnosis not present

## 2017-12-31 DIAGNOSIS — Z8249 Family history of ischemic heart disease and other diseases of the circulatory system: Secondary | ICD-10-CM | POA: Insufficient documentation

## 2017-12-31 DIAGNOSIS — M25512 Pain in left shoulder: Secondary | ICD-10-CM | POA: Diagnosis not present

## 2017-12-31 DIAGNOSIS — Z9049 Acquired absence of other specified parts of digestive tract: Secondary | ICD-10-CM | POA: Insufficient documentation

## 2017-12-31 DIAGNOSIS — G8929 Other chronic pain: Secondary | ICD-10-CM | POA: Insufficient documentation

## 2017-12-31 DIAGNOSIS — E039 Hypothyroidism, unspecified: Secondary | ICD-10-CM | POA: Diagnosis not present

## 2017-12-31 DIAGNOSIS — G5600 Carpal tunnel syndrome, unspecified upper limb: Secondary | ICD-10-CM | POA: Diagnosis not present

## 2017-12-31 DIAGNOSIS — M25552 Pain in left hip: Secondary | ICD-10-CM | POA: Diagnosis not present

## 2017-12-31 DIAGNOSIS — K219 Gastro-esophageal reflux disease without esophagitis: Secondary | ICD-10-CM | POA: Insufficient documentation

## 2017-12-31 DIAGNOSIS — J449 Chronic obstructive pulmonary disease, unspecified: Secondary | ICD-10-CM | POA: Diagnosis not present

## 2017-12-31 DIAGNOSIS — Z9889 Other specified postprocedural states: Secondary | ICD-10-CM | POA: Insufficient documentation

## 2017-12-31 DIAGNOSIS — F419 Anxiety disorder, unspecified: Secondary | ICD-10-CM | POA: Insufficient documentation

## 2017-12-31 DIAGNOSIS — M47816 Spondylosis without myelopathy or radiculopathy, lumbar region: Secondary | ICD-10-CM

## 2017-12-31 DIAGNOSIS — E0590 Thyrotoxicosis, unspecified without thyrotoxic crisis or storm: Secondary | ICD-10-CM | POA: Diagnosis not present

## 2017-12-31 DIAGNOSIS — F17210 Nicotine dependence, cigarettes, uncomplicated: Secondary | ICD-10-CM | POA: Insufficient documentation

## 2017-12-31 DIAGNOSIS — G894 Chronic pain syndrome: Secondary | ICD-10-CM

## 2017-12-31 DIAGNOSIS — M47812 Spondylosis without myelopathy or radiculopathy, cervical region: Secondary | ICD-10-CM | POA: Diagnosis not present

## 2017-12-31 DIAGNOSIS — M25551 Pain in right hip: Secondary | ICD-10-CM | POA: Insufficient documentation

## 2017-12-31 DIAGNOSIS — K224 Dyskinesia of esophagus: Secondary | ICD-10-CM | POA: Insufficient documentation

## 2017-12-31 DIAGNOSIS — M25511 Pain in right shoulder: Secondary | ICD-10-CM | POA: Diagnosis not present

## 2017-12-31 DIAGNOSIS — M542 Cervicalgia: Secondary | ICD-10-CM | POA: Diagnosis not present

## 2017-12-31 DIAGNOSIS — M48061 Spinal stenosis, lumbar region without neurogenic claudication: Secondary | ICD-10-CM | POA: Insufficient documentation

## 2017-12-31 DIAGNOSIS — F329 Major depressive disorder, single episode, unspecified: Secondary | ICD-10-CM | POA: Diagnosis not present

## 2017-12-31 DIAGNOSIS — Z79891 Long term (current) use of opiate analgesic: Secondary | ICD-10-CM | POA: Diagnosis not present

## 2017-12-31 DIAGNOSIS — E785 Hyperlipidemia, unspecified: Secondary | ICD-10-CM | POA: Insufficient documentation

## 2017-12-31 DIAGNOSIS — I2510 Atherosclerotic heart disease of native coronary artery without angina pectoris: Secondary | ICD-10-CM | POA: Diagnosis not present

## 2017-12-31 DIAGNOSIS — M7918 Myalgia, other site: Secondary | ICD-10-CM | POA: Diagnosis not present

## 2017-12-31 DIAGNOSIS — Z9841 Cataract extraction status, right eye: Secondary | ICD-10-CM | POA: Insufficient documentation

## 2017-12-31 MED ORDER — OXYCODONE HCL 5 MG PO TABS: 5.0000 mg | ORAL_TABLET | Freq: Four times a day (QID) | ORAL | 0 refills | Status: DC | PRN

## 2017-12-31 MED ORDER — TIZANIDINE HCL 2 MG PO CAPS: 2.0000 mg | ORAL_CAPSULE | Freq: Three times a day (TID) | ORAL | 2 refills | Status: DC | PRN

## 2017-12-31 NOTE — Progress Notes (Signed)
Nursing Pain Medication Assessment:  Safety precautions to be maintained throughout the outpatient stay will include: orient to surroundings, keep bed in low position, maintain call bell within reach at all times, provide assistance with transfer out of bed and ambulation.  Medication Inspection Compliance: Pill count conducted under aseptic conditions, in front of the patient. Neither the pills nor the bottle was removed from the patient's sight at any time. Once count was completed pills were immediately returned to the patient in their original bottle.  Medication: Oxycodone IR Pill/Patch Count: 7 of 120 pills remain Pill/Patch Appearance: Markings consistent with prescribed medication Bottle Appearance: Standard pharmacy container. Clearly labeled. Filled Date: 01/12/ 2019 Last Medication intake:  Today

## 2017-12-31 NOTE — Progress Notes (Signed)
Patient's Name: [name]  MRN: [id number]  Referring Provider: Albina Billet, MD  DOB: [birth year]  PCP: Albina Billet, MD  DOS: 12/31/2017  Note by: Vevelyn Francois NP  Service setting: Ambulatory outpatient  Specialty: Interventional Pain Management  Location: [facility] (AMB) Pain Management Facility    Patient type: Established    Primary Reason(s) for Visit: Encounter for prescription drug management. (Level of risk: moderate)  CC: Neck Pain and Back Pain  HPI  [name] is a 72 y.o. year old, female patient, who comes today for a medication management evaluation. 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); Cervical facet syndrome (Bilateral) (R>L); Cervical myofascial pain syndrome; Lumbar spondylosis; Chronic shoulder impingement syndrome (Right); Low magnesium levels; CRP elevated; Elevated sedimentation rate; Chronic obstructive pulmonary disease (COPD) ([place]); Nicotine dependence; Chronic shoulder pain (Right); Abnormal nerve conduction studies; Chronic shoulder pain (Bilateral) (status post multiple surgeries) (R>L); Cervical facet hypertrophy (Bilateral); History of shoulder surgery 5 (Right); Lumbar foraminal stenosis (L3-4) (Left); 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 Her primarily concern today is the Neck Pain and Back Pain  Pain Assessment: Location:   Neck(lower back) Radiating: right buttock Onset: More than a month ago Duration: Chronic pain Quality: Sharp Severity: 4 /10 (self-reported pain score)  Note: Reported level is compatible with observation.                          Timing: Constant Modifying factors: medications  [name] was last scheduled for an appointment on 10/02/2017 for medication management. During today's appointment we reviewed [name] chronic pain status, as well as her outpatient medication regimen. She states that her hip pain never stops hurting. She denies any new concerns today. She denies any side effects of her medication.   The patient  reports that she does not use drugs. Her body mass index is 20.6 kg/m.  Further details on both, my assessment(s), as well as the proposed treatment plan, please see below.  Controlled Substance Pharmacotherapy Assessment REMS (Risk Evaluation and Mitigation Strategy)  Analgesic: Oxycodone 38m QID MME/day: 30 mg/day.    WLandis Martins RN  12/31/2017  1:50 PM  Sign at close encounter Nursing Pain Medication Assessment:  Safety precautions to be maintained throughout the outpatient stay will include: orient to surroundings, keep bed in low position, maintain call bell within reach at all times, provide assistance with transfer out of bed and ambulation.  Medication Inspection Compliance: Pill count conducted under aseptic conditions, in front of the patient. Neither the pills nor the bottle was removed from the patient's sight at any time. Once count was completed pills were immediately returned to the patient in their original  bottle.  Medication:  Oxycodone IR Pill/Patch Count: 7 of 120 pills remain Pill/Patch Appearance: Markings consistent with prescribed medication Bottle Appearance: Standard pharmacy container. Clearly labeled. Filled Date: 01/12/ 2019 Last Medication intake:  Today   Pharmacokinetics: Liberation and absorption (onset of action): WNL Distribution (time to peak effect): WNL Metabolism and excretion (duration of action): WNL         Pharmacodynamics: Desired effects: Analgesia: [name] reports >50% benefit. Functional ability: Patient reports that medication allows her to accomplish basic ADLs Clinically meaningful improvement in function (CMIF): Sustained CMIF goals met Perceived effectiveness: Described as relatively effective, allowing for increase in activities of daily living (ADL) Undesirable effects: Side-effects or Adverse reactions: None reported Monitoring: [place] PMP: Online review of the past 30-monthperiod conducted. Compliant with practice rules and regulations Last UDS on record: Summary  Date Value Ref Range Status  06/05/2017 FINAL  Final    Comment:    ==================================================================== TOXASSURE SELECT 13 (MW) ==================================================================== Test                             Result       Flag       Units Drug Present and Declared for Prescription Verification   Alprazolam                     328          EXPECTED   ng/mg creat   Alpha-hydroxyalprazolam        1837         EXPECTED   ng/mg creat    Source of alprazolam is a scheduled prescription medication.    Alpha-hydroxyalprazolam is an expected metabolite of alprazolam.   Morphine                       162          EXPECTED   ng/mg creat    Potential sources of morphine include administration of codeine    or morphine, use of heroin, or ingestion of poppy seeds. Drug Present not Declared for Prescription Verification   Hydrocodone                     2096         UNEXPECTED ng/mg creat   Hydromorphone                  637          UNEXPECTED ng/mg creat   Dihydrocodeine                 200          UNEXPECTED ng/mg creat   Norhydrocodone                 2725         UNEXPECTED ng/mg creat    Sources of hydrocodone include scheduled prescription    medications. Dihydrocodeine and norhydrocodone are expected    metabolites of hydrocodone. Dihydrocodeine is also available as a    scheduled prescription medication.    Hydromorphone may be administered as a scheduled prescription    medication and is also an expected metabolite of hydrocodone and    a minor metabolite of morphine. Hydrocodone and/or its    metabolites and morphine are also present in this specimen. Drug Absent but Declared for Prescription Verification   Oxycodone  Not Detected UNEXPECTED ng/mg creat ==================================================================== Test                      Result    Flag   Units      Ref Range   Creatinine              81               mg/dL      >=20 ==================================================================== Declared Medications:  The flagging and interpretation on this report are based on the  following declared medications.  Unexpected results may arise from  inaccuracies in the declared medications.  **Note: The testing scope of this panel includes these medications:  Alprazolam  Morphine (Morphine Sulfate)  Oxycodone  **Note: The testing scope of this panel does not include following  reported medications:  Albuterol (Combivent)  Bupropion  Cyanocobalamin  Docusate (Senokot-S)  Escitalopram  Fluticasone (Advair)  Ipratropium (Combivent)  Levothyroxine  Magnesium Oxide  Pantoprazole  Salmeterol (Advair)  Sennosides (Senokot-S)  Tizanidine  Vitamin D3 ==================================================================== For clinical consultation, please call [phone]. ====================================================================    UDS interpretation: Compliant          Medication Assessment Form: Reviewed. Patient indicates being compliant with therapy Treatment compliance: Compliant Risk Assessment Profile: Aberrant behavior: See prior evaluations. None observed or detected today Comorbid factors increasing risk of overdose: See prior notes. No additional risks detected today Risk of substance use disorder (SUD): Low Opioid Risk Tool - 12/31/17 1349      Family History of Substance Abuse   Alcohol  Negative    Illegal Drugs  Negative    Rx Drugs  Negative      Personal History of Substance Abuse   Alcohol  Negative    Illegal Drugs  Negative    Rx Drugs  Negative      Age   Age between 50-45 years   No      History of Preadolescent Sexual Abuse   History of Preadolescent Sexual Abuse  Negative or Female      Psychological Disease   Psychological Disease  Negative    Depression  Positive      Total Score   Opioid Risk Tool Scoring  1    Opioid Risk Interpretation  Low Risk      ORT Scoring interpretation table:  Score <3 = Low Risk for SUD  Score between 4-7 = Moderate Risk for SUD  Score >8 = High Risk for Opioid Abuse   Risk Mitigation Strategies:  Patient Counseling: Covered Patient-Prescriber Agreement (PPA): Present and active  Notification to other healthcare providers: Done  Pharmacologic Plan: No change in therapy, at this time.             Laboratory Chemistry  Inflammation Markers (CRP: Acute Phase) (ESR: Chronic Phase) Lab Results  Component Value Date   CRP 14.3 (H) 06/05/2017   ESRSEDRATE 9 06/05/2017                 Rheumatology Markers Lab Results  Component Value Date   LABURIC 3.7 10/27/2008                Renal Function Markers Lab Results  Component Value Date   BUN 14 06/05/2017   CREATININE 0.68 06/05/2017   GFRAA 102 06/05/2017   GFRNONAA 89 06/05/2017                  Hepatic Function Markers Lab Results  Component Value Date   AST 17 06/05/2017   ALT 14 06/05/2017   ALBUMIN 3.6 06/05/2017   ALKPHOS 124 (H) 06/05/2017                 Electrolytes Lab Results  Component Value Date   NA 144 06/05/2017   K 4.0 06/05/2017   CL 104 06/05/2017   CALCIUM 9.0 06/05/2017   MG 1.7 06/05/2017   PHOS 2.9 10/02/2016                 Neuropathy Markers Lab Results  Component Value Date   VITAMINB12 282 06/05/2017                 Bone Pathology Markers Lab Results  Component Value Date   25OHVITD1 28 (L) 06/05/2017   25OHVITD2 3.0 06/05/2017   25OHVITD3 25 06/05/2017                 Coagulation Parameters Lab Results  Component Value Date   INR 1.08 10/01/2016   LABPROT 14.0 10/01/2016   PLT 405 02/20/2017                 Cardiovascular Markers Lab Results  Component Value Date   CKTOTAL 372 (H) 10/01/2016   TROPONINI <0.03 10/01/2016   HGB 11.5 (L) 02/20/2017   HCT 35.0 02/20/2017                 CA Markers No results found for: CEA, CA125, LABCA2               Note: Lab results reviewed.  Recent Diagnostic Imaging Results   Complexity Note: Imaging results reviewed. Results shared with Ms. Tonner, using Layman's terms.                         Meds   Current Outpatient Medications:  .  ALPRAZolam (XANAX) 1 MG tablet, Take 1 tablet (1 mg total) by mouth 4 (four) times daily as needed. (Patient taking differently: Take 1 mg by mouth 2 (two) times daily as needed. ), Disp: 30 tablet, Rfl: 0 .  buPROPion (WELLBUTRIN SR) 150 MG 12 hr tablet, Take 150 mg by mouth daily. , Disp: , Rfl:  .  Cholecalciferol (VITAMIN D3) 5000 units TABS, Take 1 tablet (5,000 Units total) by mouth daily., Disp: 30 tablet, Rfl: 2 .  escitalopram (LEXAPRO) 10 MG tablet, Take 10 mg by mouth daily. , Disp: , Rfl:  .  Fluticasone-Salmeterol (ADVAIR) 100-50 MCG/DOSE AEPB, Inhale 1 puff into the lungs every 12 (twelve) hours., Disp: , Rfl:  .   Ipratropium-Albuterol (COMBIVENT RESPIMAT) 20-100 MCG/ACT AERS respimat, Inhale 1 puff into the lungs every 6 (six) hours as needed for wheezing or shortness of breath., Disp: , Rfl:  .  levothyroxine (SYNTHROID, LEVOTHROID) 88 MCG tablet, Take 88 mcg by mouth daily before breakfast. , Disp: , Rfl:  .  [START ON 03/01/2018] oxyCODONE (OXY IR/ROXICODONE) 5 MG immediate release tablet, Take 1 tablet (5 mg total) by mouth every 6 (six) hours as needed for severe pain., Disp: 120 tablet, Rfl: 0 .  tizanidine (ZANAFLEX) 2 MG capsule, Take 1 capsule (2 mg total) by mouth 3 (three) times daily as needed for muscle spasms., Disp: 90 capsule, Rfl: 2 .  traZODone (DESYREL) 150 MG tablet, Take 300 mg by mouth at bedtime. , Disp: , Rfl:  .  magnesium oxide (MAG-OX) 400 MG tablet, Take 1 tablet (400 mg total) by  mouth 2 (two) times daily., Disp: 180 tablet, Rfl: 0 .  [START ON 01/30/2018] oxyCODONE (OXY IR/ROXICODONE) 5 MG immediate release tablet, Take 1 tablet (5 mg total) by mouth every 6 (six) hours as needed for severe pain., Disp: 120 tablet, Rfl: 0 .  oxyCODONE (OXY IR/ROXICODONE) 5 MG immediate release tablet, Take 1 tablet (5 mg total) by mouth every 6 (six) hours as needed for severe pain., Disp: 120 tablet, Rfl: 0  ROS  Constitutional: Denies any fever or chills Gastrointestinal: No reported hemesis, hematochezia, vomiting, or acute GI distress Musculoskeletal: Denies any acute onset joint swelling, redness, loss of ROM, or weakness Neurological: No reported episodes of acute onset apraxia, aphasia, dysarthria, agnosia, amnesia, paralysis, loss of coordination, or loss of consciousness  Allergies  Ms. Nienhuis has No Known Allergies.  [place]  Drug: Ms. Pieczynski  reports that she does not use drugs. Alcohol:  reports that she does not drink alcohol. Tobacco:  reports that she has been smoking cigarettes.  She has been smoking about 0.50 packs per day. she has never used smokeless tobacco. Medical:  has a past  medical history of Anxiety, CAD (coronary artery disease), Carpal tunnel syndrome, Cataract, Complication of anesthesia, COPD (chronic obstructive pulmonary disease) ([place]), CRP elevated (09/14/2015), Depression, Difficult intubation, Dyslipidemia, Dyspnea, Dysrhythmia, Elevated sedimentation rate (09/14/2015), Esophageal spasm, Gastrointestinal parasites, GERD (gastroesophageal reflux disease), Hiatal hernia, History of peptic ulcer disease, Hyperthyroidism, Hypothyroidism, Low magnesium levels (09/14/2015), Pelvic fracture ([place]) (2008), Reflux, Rotator cuff injury, and Stenosis, spinal, lumbar. Surgical: Ms. Cavness  has a past surgical history that includes Cesarean section; Neck surgery; Cholecystectomy; Carpal tunnel release; Rotator cuff repair; Shoulder arthroscopy (12/07/2011); Shoulder surgery (12/07/2011); Intramedullary (im) nail intertrochanteric (Right, 10/01/2016); Fracture surgery; Appendectomy; Nose surgery; Back surgery; Cataract extraction w/PHACO (Right, 08/07/2017); Hip surgery; and Cataract extraction w/PHACO (Left, 09/04/2017). Family: family history includes Aneurysm in her unknown relative.  Constitutional Exam  General appearance: Well nourished, well developed, and well hydrated. In no apparent acute distress Vitals:   12/31/17 1345  BP: (!) 161/89  Pulse: (!) 116  Resp: 18  Temp: 98.2 F (36.8 C)  TempSrc: Oral  SpO2: 97%  Weight: 120 lb (54.4 kg)  Height: 5' 4"  (1.626 m)  Psych/Mental status: Alert, oriented x 3 (person, place, & time)       Eyes: PERLA Respiratory: No evidence of acute respiratory distress  Cervical Spine Area Exam  Skin & Axial Inspection: No masses, redness, edema, swelling, or associated skin lesions Alignment: Symmetrical Functional ROM: Unrestricted ROM      Stability: No instability detected Muscle Tone/Strength: Functionally intact. No obvious neuro-muscular anomalies detected. Sensory (Neurological): Unimpaired Palpation: No palpable  anomalies              Upper Extremity (UE) Exam    Side: Right upper extremity  Side: Left upper extremity  Skin & Extremity Inspection: Skin color, temperature, and hair growth are WNL. No peripheral edema or cyanosis. No masses, redness, swelling, asymmetry, or associated skin lesions. No contractures.  Skin & Extremity Inspection: Skin color, temperature, and hair growth are WNL. No peripheral edema or cyanosis. No masses, redness, swelling, asymmetry, or associated skin lesions. No contractures.  Functional ROM: Unrestricted ROM          Functional ROM: Unrestricted ROM          Muscle Tone/Strength: Functionally intact. No obvious neuro-muscular anomalies detected.  Muscle Tone/Strength: Functionally intact. No obvious neuro-muscular anomalies detected.  Sensory (Neurological): Unimpaired  Sensory (Neurological): Unimpaired          Palpation: No palpable anomalies              Palpation: No palpable anomalies              Specialized Test(s): Deferred         Specialized Test(s): Deferred          Thoracic Spine Area Exam  Skin & Axial Inspection: No masses, redness, or swelling Alignment: Symmetrical Functional ROM: Unrestricted ROM Stability: No instability detected Muscle Tone/Strength: Functionally intact. No obvious neuro-muscular anomalies detected. Sensory (Neurological): Unimpaired Muscle strength & Tone: No palpable anomalies  Lumbar Spine Area Exam  Skin & Axial Inspection: No masses, redness, or swelling Alignment: Symmetrical Functional ROM: Unrestricted ROM      Stability: No instability detected Muscle Tone/Strength: Functionally intact. No obvious neuro-muscular anomalies detected. Sensory (Neurological): Unimpaired Palpation: Complains of area being tender to palpation       Provocative Tests: Lumbar Hyperextension and rotation test: Positive       Lumbar Lateral bending test: evaluation deferred today       Patrick's Maneuver: evaluation deferred today                     Gait & Posture Assessment  Ambulation: Unassisted Gait: Relatively normal for age and body habitus Posture: WNL   Lower Extremity Exam    Side: Right lower extremity  Side: Left lower extremity  Skin & Extremity Inspection: Well healed scar from prior surgery  Skin & Extremity Inspection: Skin color, temperature, and hair growth are WNL. No peripheral edema or cyanosis. No masses, redness, swelling, asymmetry, or associated skin lesions. No contractures.  Functional ROM: Unrestricted ROM          Functional ROM: Unrestricted ROM          Muscle Tone/Strength: Functionally intact. No obvious neuro-muscular anomalies detected.  Muscle Tone/Strength: Functionally intact. No obvious neuro-muscular anomalies detected.  Sensory (Neurological): Unimpaired  Sensory (Neurological): Unimpaired  Palpation: No palpable anomalies  Palpation: No palpable anomalies   Assessment  Primary Diagnosis & Pertinent Problem List: The primary encounter diagnosis was Lumbar spondylosis. Diagnoses of Cervical spondylosis, Chronic sacroiliac joint pain (Right), Chronic pain syndrome, Low magnesium levels, Myofascial pain syndrome, cervical, Chronic pain of both hips, and Long term current use of opiate analgesic were also pertinent to this visit.  Status Diagnosis  Controlled Controlled Controlled 1. Lumbar spondylosis   2. Cervical spondylosis   3. Chronic sacroiliac joint pain (Right)   4. Chronic pain syndrome   5. Low magnesium levels   6. Myofascial pain syndrome, cervical   7. Chronic pain of both hips   8. Long term current use of opiate analgesic     Problems updated and reviewed during this visit: Problem  Chronic Pain of Both Hips   Plan of Care  Pharmacotherapy (Medications Ordered): Meds ordered this encounter  Medications  . oxyCODONE (OXY IR/ROXICODONE) 5 MG immediate release tablet    Sig: Take 1 tablet (5 mg total) by mouth every 6 (six) hours as needed for severe pain.     Dispense:  120 tablet    Refill:  0    Do not place this medication, or any other prescription from our practice, on "Automatic Refill". Patient may have prescription filled one day early if pharmacy is closed on scheduled refill date. Do not fill until: 03/01/2018 To last until:03/31/2018    Order Specific Question:  Supervising Provider    Answer:   Milinda Pointer [phone]  . oxyCODONE (OXY IR/ROXICODONE) 5 MG immediate release tablet    Sig: Take 1 tablet (5 mg total) by mouth every 6 (six) hours as needed for severe pain.    Dispense:  120 tablet    Refill:  0    Do not place this medication, or any other prescription from our practice, on "Automatic Refill". Patient may have prescription filled one day early if pharmacy is closed on scheduled refill date. Do not fill until: 01/30/2018 To last until: 03/01/2018    Order Specific Question:   Supervising Provider    Answer:   Milinda Pointer [phone]  . oxyCODONE (OXY IR/ROXICODONE) 5 MG immediate release tablet    Sig: Take 1 tablet (5 mg total) by mouth every 6 (six) hours as needed for severe pain.    Dispense:  120 tablet    Refill:  0    Do not place this medication, or any other prescription from our practice, on "Automatic Refill". Patient may have prescription filled one day early if pharmacy is closed on scheduled refill date. Do not fill until:12/31/2017 To last until: 01/30/2018    Order Specific Question:   Supervising Provider    Answer:   Milinda Pointer [phone]  . tizanidine (ZANAFLEX) 2 MG capsule    Sig: Take 1 capsule (2 mg total) by mouth 3 (three) times daily as needed for muscle spasms.    Dispense:  90 capsule    Refill:  2    Do not place this medication, or any other prescription from our practice, on "Automatic Refill". Patient may have prescription filled one day early if pharmacy is closed on scheduled refill date.    Order Specific Question:   Supervising Provider    Answer:   Milinda Pointer  [[id number]]   New Prescriptions   No medications on file   Medications administered today: Mariaelena A. Forgette had no medications administered during this visit. Lab-work, procedure(s), and/or referral(s): Orders Placed This Encounter  Procedures  . ToxASSURE Select 13 (MW), Urine   Imaging and/or referral(s): None  Interventional management options: Planned, scheduled, and/or pending: Not at this time.   Considering: Diagnostic right-sidedlumbarfacet + right-sided sacroiliac joint block#2 Diagnostic right-sided sacroiliac joint block  Possible right sided sacroiliac joint RFA Diagnostic right-sided lumbar facet block  Possible right-sided lumbar facet RFA Diagnostic bilateral cervical facetblock  Possible bilateral cervical facet radiofrequencyablation  Diagnostic right-sided cervical epidural steroid injection  Diagnostic bilateral intra-articular shoulderjoint injection  Diagnostic bilateral suprascapularnerve block  Possible bilateral suprascapular nerve radiofrequencyablation  Diagnostic left-sided L3-4 transforaminal epiduralsteroid injection  Diagnostic L3-4 versus L4-5 lumbar epiduralsteroid injection    Palliative PRN treatment(s): Palliative right-sided sacroiliac joint block Palliative right-sided lumbar facet block   Provider-requested follow-up: Return in 3 months (on 03/20/2018) for MedMgmt with Me Dionisio David).  No future appointments. Primary Care Physician: Albina Billet, MD Location: [facility] Note by: Vevelyn Francois NP Date: 12/31/2017; Time: 2:14 PM  Pain Score Disclaimer: We use the NRS-11 scale. This is a self-reported, subjective measurement of pain severity with only modest accuracy. It is used primarily to identify changes within a particular patient. It must be understood that outpatient pain scales are significantly less accurate that those used for research, where they can be applied under ideal  controlled circumstances with minimal exposure to variables. In reality, the score is likely to be a combination of pain intensity and pain affect,  where pain affect describes the degree of emotional arousal or changes in action readiness caused by the sensory experience of pain. Factors such as social and work situation, setting, emotional state, anxiety levels, expectation, and prior pain experience may influence pain perception and show large inter-individual differences that may also be affected by time variables.  Patient instructions provided during this appointment: Patient Instructions   ____________________________________________________________________________________________  Medication Rules  Applies to: All patients receiving prescriptions (written or electronic).  Pharmacy of record: Pharmacy where electronic prescriptions will be sent. If written prescriptions are taken to a different pharmacy, please inform the nursing staff. The pharmacy listed in the electronic medical record should be the one where you would like electronic prescriptions to be sent.  Prescription refills: Only during scheduled appointments. Applies to both, written and electronic prescriptions.  NOTE: The following applies primarily to controlled substances (Opioid* Pain Medications).   Patient's responsibilities: 1. Pain Pills: Bring all pain pills to every appointment (except for procedure appointments). 2. Pill Bottles: Bring pills in original pharmacy bottle. Always bring newest bottle. Bring bottle, even if empty. 3. Medication refills: You are responsible for knowing and keeping track of what medications you need refilled. The day before your appointment, write a list of all prescriptions that need to be refilled. Bring that list to your appointment and give it to the admitting nurse. Prescriptions will be written only during appointments. If you forget a medication, it will not be "Called in", "Faxed",  or "electronically sent". You will need to get another appointment to get these prescribed. 4. Prescription Accuracy: You are responsible for carefully inspecting your prescriptions before leaving our office. Have the discharge nurse carefully go over each prescription with you, before taking them home. Make sure that your name is accurately spelled, that your address is correct. Check the name and dose of your medication to make sure it is accurate. Check the number of pills, and the written instructions to make sure they are clear and accurate. Make sure that you are given enough medication to last until your next medication refill appointment. 5. Taking Medication: Take medication as prescribed. Never take more pills than instructed. Never take medication more frequently than prescribed. Taking less pills or less frequently is permitted and encouraged, when it comes to controlled substances (written prescriptions).  6. Inform other Doctors: Always inform, all of your healthcare providers, of all the medications you take. 7. Pain Medication from other Providers: You are not allowed to accept any additional pain medication from any other Doctor or Healthcare provider. There are two exceptions to this rule. (see below) In the event that you require additional pain medication, you are responsible for notifying us, as stated below. 8. Medication Agreement: You are responsible for carefully reading and following our Medication Agreement. This must be signed before receiving any prescriptions from our practice. Safely store a copy of your signed Agreement. Violations to the Agreement will result in no further prescriptions. (Additional copies of our Medication Agreement are available upon request.) 9. Laws, Rules, & Regulations: All patients are expected to follow all Federal and [employer], [employer], Rules, [employer]. Ignorance of the Laws does not constitute a valid excuse. The use of any illegal substances  is prohibited. 10. Adopted CDC guidelines & recommendations: Target dosing levels will be at or below 60 MME/day. Use of benzodiazepines** is not recommended.  Exceptions: There are only two exceptions to the rule of not receiving pain medications from other Healthcare Providers. 1. Exception #1 (  Emergencies): In the event of an emergency (i.e.: accident requiring emergency care), you are allowed to receive additional pain medication. However, you are responsible for: As soon as you are able, call our office [phone], at any time of the day or night, and leave a message stating your name, the date and nature of the emergency, and the name and dose of the medication prescribed. In the event that your call is answered by a member of our staff, make sure to document and save the date, time, and the name of the person that took your information.  2. Exception #2 (Planned Surgery): In the event that you are scheduled by another doctor or dentist to have any type of surgery or procedure, you are allowed (for a period no longer than 30 days), to receive additional pain medication, for the acute post-op pain. However, in this case, you are responsible for picking up a copy of our "Post-op Pain Management for Surgeons" handout, and giving it to your surgeon or dentist. This document is available at our office, and does not require an appointment to obtain it. Simply go to our office during business hours (Monday-Thursday from 8:00 AM to 4:00 PM) (Friday 8:00 AM to 12:00 Noon) or if you have a scheduled appointment with [place], prior to your surgery, and ask for it by name. In addition, you will need to provide [place] with your name, name of your surgeon, type of surgery, and date of procedure or surgery.  *Opioid medications include: morphine, codeine, oxycodone, oxymorphone, hydrocodone, hydromorphone, meperidine, tramadol, tapentadol, buprenorphine, fentanyl, methadone. **Benzodiazepine medications include: diazepam  (Valium), alprazolam (Xanax), clonazepam (Klonopine), lorazepam (Ativan), clorazepate (Tranxene), chlordiazepoxide (Librium), estazolam (Prosom), oxazepam (Serax), temazepam (Restoril), triazolam (Halcion)  ____________________________________________________________________________________________

## 2017-12-31 NOTE — Patient Instructions (Signed)

## 2018-01-03 ENCOUNTER — Telehealth: Payer: Self-pay | Admitting: *Deleted

## 2018-01-03 NOTE — Telephone Encounter (Signed)
Pharmacist needed Tizanidine to be changed from capsule to tablets.  Verbal order received to change to tablets.

## 2018-01-05 LAB — TOXASSURE SELECT 13 (MW), URINE

## 2018-03-25 ENCOUNTER — Encounter: Payer: Medicare HMO | Admitting: Nurse Practitioner

## 2018-03-28 ENCOUNTER — Other Ambulatory Visit: Payer: Self-pay

## 2018-03-28 ENCOUNTER — Encounter: Payer: Self-pay | Admitting: Nurse Practitioner

## 2018-03-28 ENCOUNTER — Ambulatory Visit: Payer: Medicare HMO | Attending: Nurse Practitioner | Admitting: Nurse Practitioner

## 2018-03-28 VITALS — BP 131/106 | HR 114 | Temp 97.5°F | Resp 18 | Ht 64.0 in | Wt 130.0 lb

## 2018-03-28 DIAGNOSIS — G8929 Other chronic pain: Secondary | ICD-10-CM | POA: Diagnosis not present

## 2018-03-28 DIAGNOSIS — Z79891 Long term (current) use of opiate analgesic: Secondary | ICD-10-CM | POA: Insufficient documentation

## 2018-03-28 DIAGNOSIS — F419 Anxiety disorder, unspecified: Secondary | ICD-10-CM | POA: Diagnosis not present

## 2018-03-28 DIAGNOSIS — F329 Major depressive disorder, single episode, unspecified: Secondary | ICD-10-CM | POA: Insufficient documentation

## 2018-03-28 DIAGNOSIS — Z79890 Hormone replacement therapy (postmenopausal): Secondary | ICD-10-CM | POA: Insufficient documentation

## 2018-03-28 DIAGNOSIS — M47892 Other spondylosis, cervical region: Secondary | ICD-10-CM | POA: Diagnosis not present

## 2018-03-28 DIAGNOSIS — M47816 Spondylosis without myelopathy or radiculopathy, lumbar region: Secondary | ICD-10-CM | POA: Insufficient documentation

## 2018-03-28 DIAGNOSIS — M25512 Pain in left shoulder: Secondary | ICD-10-CM | POA: Diagnosis not present

## 2018-03-28 DIAGNOSIS — E039 Hypothyroidism, unspecified: Secondary | ICD-10-CM | POA: Insufficient documentation

## 2018-03-28 DIAGNOSIS — K219 Gastro-esophageal reflux disease without esophagitis: Secondary | ICD-10-CM | POA: Insufficient documentation

## 2018-03-28 DIAGNOSIS — M533 Sacrococcygeal disorders, not elsewhere classified: Secondary | ICD-10-CM | POA: Diagnosis not present

## 2018-03-28 DIAGNOSIS — R790 Abnormal level of blood mineral: Secondary | ICD-10-CM

## 2018-03-28 DIAGNOSIS — M25551 Pain in right hip: Secondary | ICD-10-CM | POA: Diagnosis not present

## 2018-03-28 DIAGNOSIS — G894 Chronic pain syndrome: Secondary | ICD-10-CM | POA: Diagnosis not present

## 2018-03-28 DIAGNOSIS — M7918 Myalgia, other site: Secondary | ICD-10-CM | POA: Diagnosis not present

## 2018-03-28 DIAGNOSIS — M48061 Spinal stenosis, lumbar region without neurogenic claudication: Secondary | ICD-10-CM | POA: Diagnosis not present

## 2018-03-28 DIAGNOSIS — M25511 Pain in right shoulder: Secondary | ICD-10-CM | POA: Insufficient documentation

## 2018-03-28 DIAGNOSIS — M8938 Hypertrophy of bone, other site: Secondary | ICD-10-CM | POA: Diagnosis not present

## 2018-03-28 DIAGNOSIS — Z5181 Encounter for therapeutic drug level monitoring: Secondary | ICD-10-CM | POA: Insufficient documentation

## 2018-03-28 DIAGNOSIS — M25561 Pain in right knee: Secondary | ICD-10-CM | POA: Insufficient documentation

## 2018-03-28 DIAGNOSIS — J449 Chronic obstructive pulmonary disease, unspecified: Secondary | ICD-10-CM | POA: Diagnosis not present

## 2018-03-28 DIAGNOSIS — M545 Low back pain: Secondary | ICD-10-CM | POA: Diagnosis not present

## 2018-03-28 DIAGNOSIS — E538 Deficiency of other specified B group vitamins: Secondary | ICD-10-CM | POA: Diagnosis not present

## 2018-03-28 DIAGNOSIS — Z79899 Other long term (current) drug therapy: Secondary | ICD-10-CM | POA: Insufficient documentation

## 2018-03-28 DIAGNOSIS — M25552 Pain in left hip: Secondary | ICD-10-CM | POA: Insufficient documentation

## 2018-03-28 DIAGNOSIS — Z9049 Acquired absence of other specified parts of digestive tract: Secondary | ICD-10-CM | POA: Insufficient documentation

## 2018-03-28 DIAGNOSIS — F17210 Nicotine dependence, cigarettes, uncomplicated: Secondary | ICD-10-CM | POA: Insufficient documentation

## 2018-03-28 DIAGNOSIS — E782 Mixed hyperlipidemia: Secondary | ICD-10-CM | POA: Insufficient documentation

## 2018-03-28 DIAGNOSIS — I2510 Atherosclerotic heart disease of native coronary artery without angina pectoris: Secondary | ICD-10-CM | POA: Insufficient documentation

## 2018-03-28 MED ORDER — OXYCODONE HCL 5 MG PO TABS: 5.0000 mg | ORAL_TABLET | Freq: Four times a day (QID) | ORAL | 0 refills | Status: DC | PRN

## 2018-03-28 MED ORDER — TIZANIDINE HCL 2 MG PO CAPS: 2.0000 mg | ORAL_CAPSULE | Freq: Three times a day (TID) | ORAL | 2 refills | Status: DC | PRN

## 2018-03-28 MED ORDER — MAGNESIUM OXIDE 400 MG PO TABS
400.0000 mg | ORAL_TABLET | Freq: Two times a day (BID) | ORAL | 0 refills | Status: DC
Start: 2018-03-30 — End: 2018-09-30

## 2018-03-28 NOTE — Progress Notes (Signed)
Nursing Pain Medication Assessment:  Safety precautions to be maintained throughout the outpatient stay will include: orient to surroundings, keep bed in low position, maintain call bell within reach at all times, provide assistance with transfer out of bed and ambulation.  Medication Inspection Compliance: Pill count conducted under aseptic conditions, in front of the patient. Neither the pills nor the bottle was removed from the patient's sight at any time. Once count was completed pills were immediately returned to the patient in their original bottle.  Medication: Oxycodone IR Pill/Patch Count: 10 of 120 pills remain Pill/Patch Appearance: Markings consistent with prescribed medication Bottle Appearance: Standard pharmacy container. Clearly labeled. Filled Date: 04 / 12 / 2019 Last Medication intake:  Today

## 2018-03-28 NOTE — Patient Instructions (Addendum)
__You have been given here prescription for Oxycodone to last until 06/28/18. Magnesium and Zanaflex sent to your pharmacy.  __________________________________________________________________________________________  Medication Rules  Applies to: All patients receiving prescriptions (written or electronic).  Pharmacy of record: Pharmacy where electronic prescriptions will be sent. If written prescriptions are taken to a different pharmacy, please inform the nursing staff. The pharmacy listed in the electronic medical record should be the one where you would like electronic prescriptions to be sent.  Prescription refills: Only during scheduled appointments. Applies to both, written and electronic prescriptions.  NOTE: The following applies primarily to controlled substances (Opioid* Pain Medications).   Patient's responsibilities: 1. Pain Pills: Bring all pain pills to every appointment (except for procedure appointments). 2. Pill Bottles: Bring pills in original pharmacy bottle. Always bring newest bottle. Bring bottle, even if empty. 3. Medication refills: You are responsible for knowing and keeping track of what medications you need refilled. The day before your appointment, write a list of all prescriptions that need to be refilled. Bring that list to your appointment and give it to the admitting nurse. Prescriptions will be written only during appointments. If you forget a medication, it will not be "Called in", "Faxed", or "electronically sent". You will need to get another appointment to get these prescribed. 4. Prescription Accuracy: You are responsible for carefully inspecting your prescriptions before leaving our office. Have the discharge nurse carefully go over each prescription with you, before taking them home. Make sure that your name is accurately spelled, that your address is correct. Check the name and dose of your medication to make sure it is accurate. Check the number of pills, and  the written instructions to make sure they are clear and accurate. Make sure that you are given enough medication to last until your next medication refill appointment. 5. Taking Medication: Take medication as prescribed. Never take more pills than instructed. Never take medication more frequently than prescribed. Taking less pills or less frequently is permitted and encouraged, when it comes to controlled substances (written prescriptions).  6. Inform other Doctors: Always inform, all of your healthcare providers, of all the medications you take. 7. Pain Medication from other Providers: You are not allowed to accept any additional pain medication from any other Doctor or Healthcare provider. There are two exceptions to this rule. (see below) In the event that you require additional pain medication, you are responsible for notifying us, as stated below. 8. Medication Agreement: You are responsible for carefully reading and following our Medication Agreement. This must be signed before receiving any prescriptions from our practice. Safely store a copy of your signed Agreement. Violations to the Agreement will result in no further prescriptions. (Additional copies of our Medication Agreement are available upon request.) 9. Laws, Rules, & Regulations: All patients are expected to follow all Federal and [employer], [employer], Rules, [employer]. Ignorance of the Laws does not constitute a valid excuse. The use of any illegal substances is prohibited. 10. Adopted CDC guidelines & recommendations: Target dosing levels will be at or below 60 MME/day. Use of benzodiazepines** is not recommended.  Exceptions: There are only two exceptions to the rule of not receiving pain medications from other Healthcare Providers. 1. Exception #1 (Emergencies): In the event of an emergency (i.e.: accident requiring emergency care), you are allowed to receive additional pain medication. However, you are responsible for: As soon as  you are able, call our office [phone], at any time of the day or night, and leave  a message stating your name, the date and nature of the emergency, and the name and dose of the medication prescribed. In the event that your call is answered by a member of our staff, make sure to document and save the date, time, and the name of the person that took your information.  2. Exception #2 (Planned Surgery): In the event that you are scheduled by another doctor or dentist to have any type of surgery or procedure, you are allowed (for a period no longer than 30 days), to receive additional pain medication, for the acute post-op pain. However, in this case, you are responsible for picking up a copy of our "Post-op Pain Management for Surgeons" handout, and giving it to your surgeon or dentist. This document is available at our office, and does not require an appointment to obtain it. Simply go to our office during business hours (Monday-Thursday from 8:00 AM to 4:00 PM) (Friday 8:00 AM to 12:00 Noon) or if you have a scheduled appointment with [place], prior to your surgery, and ask for it by name. In addition, you will need to provide [place] with your name, name of your surgeon, type of surgery, and date of procedure or surgery.  *Opioid medications include: morphine, codeine, oxycodone, oxymorphone, hydrocodone, hydromorphone, meperidine, tramadol, tapentadol, buprenorphine, fentanyl, methadone. **Benzodiazepine medications include: diazepam (Valium), alprazolam (Xanax), clonazepam (Klonopine), lorazepam (Ativan), clorazepate (Tranxene), chlordiazepoxide (Librium), estazolam (Prosom), oxazepam (Serax), temazepam (Restoril), triazolam (Halcion) (Last updated: 01/17/2018) ____________________________________________________________________________________________

## 2018-03-28 NOTE — Progress Notes (Signed)
Patient's Name: [name]  MRN: [id number]  Referring Provider: Albina Billet, MD  DOB: 1946-03-08  PCP: Albina Billet, MD  DOS: 03/28/2018  Note by: Vevelyn Francois NP  Service setting: Ambulatory outpatient  Specialty: Interventional Pain Management  Location: [facility] (AMB) Pain Management Facility    Patient type: Established    Primary Reason(s) for Visit: Encounter for prescription drug management. (Level of risk: moderate)  CC: Back Pain (low); Hip Pain (right); and Knee Pain (right)  HPI  [name] is a 72 y.o. year old, female patient, who comes today for a medication management evaluation. 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); Cervical facet syndrome (Bilateral) (R>L); Cervical myofascial pain syndrome; Lumbar spondylosis; Chronic shoulder impingement syndrome (Right); Low magnesium levels; CRP elevated; Elevated sedimentation rate; Chronic obstructive pulmonary disease (COPD) ([place]); Nicotine dependence; Chronic shoulder pain (Right); Abnormal nerve conduction studies; Chronic shoulder pain (Bilateral) (status post multiple surgeries) (R>L); Cervical facet hypertrophy (Bilateral); History of shoulder surgery 5 (Right); Lumbar foraminal stenosis (L3-4) (Left); 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 Her primarily concern today is the Back Pain (low); Hip Pain (right); and Knee Pain (right)  Pain Assessment: Location: Lower Back Radiating: right hip, right knee Onset: More than a month ago Duration: Chronic pain Quality: Throbbing, Aching, Burning, Constant Severity: 5 /10 (subjective, self-reported pain score)  Note: Reported level is compatible with observation.                          Timing: Constant Modifying factors: medications BP: (!) 131/106  HR: (!) 114  [name] was last scheduled for an appointment on 03/25/2018 for medication management. During today's appointment we reviewed [name] chronic pain status, as well as her outpatient medication regimen.She admits that her right hip makes her walk funny. She states that she did undego PT. She admitted that she waited 5 days before she shout treatment.   The patient  reports that she does not use drugs. Her body mass index is 22.31 kg/m.  Further details on both, my assessment(s), as well as the proposed treatment plan, please see below.  Controlled Substance Pharmacotherapy Assessment REMS (Risk Evaluation and Mitigation Strategy)  Analgesic:Oxycodone 84m QID MME/day:37mday.    ShHart RochesterRN  03/28/2018  2:15 PM  Sign at close encounter Nursing Pain Medication Assessment:  Safety precautions to be maintained throughout the outpatient stay will include: orient to surroundings, keep bed in low position, maintain call bell within reach at all times, provide assistance with transfer out of bed and ambulation.  Medication Inspection Compliance: Pill count conducted under aseptic conditions, in front of the patient. Neither the pills nor the bottle was  removed from the  patient's sight at any time. Once count was completed pills were immediately returned to the patient in their original bottle.  Medication: Oxycodone IR Pill/Patch Count: 10 of 120 pills remain Pill/Patch Appearance: Markings consistent with prescribed medication Bottle Appearance: Standard pharmacy container. Clearly labeled. Filled Date: 04 / 12 / 2019 Last Medication intake:  Today   Pharmacokinetics: Liberation and absorption (onset of action): WNL Distribution (time to peak effect): WNL Metabolism and excretion (duration of action): WNL         Pharmacodynamics: Desired effects: Analgesia: [name] reports >50% benefit. Functional ability: Patient reports that medication allows her to accomplish basic ADLs Clinically meaningful improvement in function (CMIF): Sustained CMIF goals met Perceived effectiveness: Described as relatively effective, allowing for increase in activities of daily living (ADL) Undesirable effects: Side-effects or Adverse reactions: None reported Monitoring: [place] PMP: Online review of the past 25-monthperiod conducted. Compliant with practice rules and regulations Last UDS on record: Summary  Date Value Ref Range Status  12/31/2017 FINAL  Final    Comment:    ==================================================================== TOXASSURE SELECT 13 (MW) ==================================================================== Test                             Result       Flag       Units Drug Present and Declared for Prescription Verification   Alprazolam                     184          EXPECTED   ng/mg creat   Alpha-hydroxyalprazolam        713          EXPECTED   ng/mg creat    Source of alprazolam is a scheduled prescription medication.    Alpha-hydroxyalprazolam is an expected metabolite of alprazolam.   Oxycodone                      331          EXPECTED   ng/mg creat   Oxymorphone                    439          EXPECTED   ng/mg  creat   Noroxycodone                   1615         EXPECTED   ng/mg creat   Noroxymorphone                 160          EXPECTED   ng/mg creat    Sources of oxycodone are scheduled prescription medications.    Oxymorphone, noroxycodone, and noroxymorphone are expected    metabolites of oxycodone. Oxymorphone is also available as a    scheduled prescription medication. ==================================================================== Test                      Result    Flag   Units      Ref Range   Creatinine              62               mg/dL      >=20 ==================================================================== Declared Medications:  The flagging and interpretation on this report are based on the  following declared  medications.  Unexpected results may arise from  inaccuracies in the declared medications.  **Note: The testing scope of this panel includes these medications:  Alprazolam  Oxycodone  **Note: The testing scope of this panel does not include following  reported medications:  Albuterol (Ipratropium-Albuterol)  Bupropion  Cholecalciferol  Escitalopram  Fluticasone  Ipratropium (Ipratropium-Albuterol)  Levothyroxine  Magnesium Oxide  Salmeterol  Tizanidine  Trazodone ==================================================================== For clinical consultation, please call [phone]. ====================================================================    UDS interpretation: Compliant          Medication Assessment Form: Reviewed. Patient indicates being compliant with therapy Treatment compliance: Compliant Risk Assessment Profile: Aberrant behavior: See prior evaluations. None observed or detected today Comorbid factors increasing risk of overdose: See prior notes. No additional risks detected today Risk of substance use disorder (SUD): Low Opioid Risk Tool - 03/28/18 1423      Family History of Substance Abuse   Alcohol  Negative    Illegal  Drugs  Negative    Rx Drugs  Negative      Personal History of Substance Abuse   Alcohol  Negative    Illegal Drugs  Negative    Rx Drugs  Negative      Age   Age between 32-45 years   No      History of Preadolescent Sexual Abuse   History of Preadolescent Sexual Abuse  Negative or Female      Psychological Disease   Psychological Disease  Negative    Depression  Positive      Total Score   Opioid Risk Tool Scoring  1    Opioid Risk Interpretation  Low Risk      ORT Scoring interpretation table:  Score <3 = Low Risk for SUD  Score between 4-7 = Moderate Risk for SUD  Score >8 = High Risk for Opioid Abuse   Risk Mitigation Strategies:  Patient Counseling: Covered Patient-Prescriber Agreement (PPA): Present and active  Notification to other healthcare providers: Done  Pharmacologic Plan: No change in therapy, at this time.             Laboratory Chemistry  Inflammation Markers (CRP: Acute Phase) (ESR: Chronic Phase) Lab Results  Component Value Date   CRP 14.3 (H) 06/05/2017   ESRSEDRATE 9 06/05/2017                         Rheumatology Markers Lab Results  Component Value Date   LABURIC 3.7 10/27/2008                        Renal Function Markers Lab Results  Component Value Date   BUN 14 06/05/2017   CREATININE 0.68 06/05/2017   GFRAA 102 06/05/2017   GFRNONAA 89 06/05/2017                              Hepatic Function Markers Lab Results  Component Value Date   AST 17 06/05/2017   ALT 14 06/05/2017   ALBUMIN 3.6 06/05/2017   ALKPHOS 124 (H) 06/05/2017                        Electrolytes Lab Results  Component Value Date   NA 144 06/05/2017   K 4.0 06/05/2017   CL 104 06/05/2017   CALCIUM 9.0 06/05/2017   MG 1.7 06/05/2017   PHOS 2.9 10/02/2016  Neuropathy Markers Lab Results  Component Value Date   VITAMINB12 282 06/05/2017                        Bone Pathology Markers Lab Results  Component Value Date    25OHVITD1 28 (L) 06/05/2017   25OHVITD2 3.0 06/05/2017   25OHVITD3 25 06/05/2017                         Coagulation Parameters Lab Results  Component Value Date   INR 1.08 10/01/2016   LABPROT 14.0 10/01/2016   PLT 405 02/20/2017                        Cardiovascular Markers Lab Results  Component Value Date   CKTOTAL 372 (H) 10/01/2016   TROPONINI <0.03 10/01/2016   HGB 11.5 (L) 02/20/2017   HCT 35.0 02/20/2017                         CA Markers No results found for: CEA, CA125, LABCA2                      Note: Lab results reviewed.  Recent Diagnostic Imaging Results  DG Lumbar Spine Complete W/Bend CLINICAL DATA:  72 year old female fractured hip in November with low back pain since. Initial encounter.  EXAM: LUMBAR SPINE - COMPLETE WITH BENDING VIEWS  COMPARISON:  02/10/2014.  FINDINGS: New from the 2015 lumbar spine plain film examination are T11 and T12 superior endplate compression fractures with 25-30% loss height. There is also a new L5 superior endplate compression fracture with 20% loss of height. Please see MR lumbar spine report performed same date and dictated separately.  Facet degenerative changes lumbar spine with minimal anterior slip L3. No abnormal motion between flexion and extension.  No pars defect.  Mild curvature lower thoracic lumbar spine convex right.  Vascular calcifications.  IMPRESSION: New from the 2015 lumbar spine plain film examination are T11 and T12 superior endplate compression fractures with 25-30% loss height. There is also a new L5 superior endplate compression fracture with 20% loss of height. Please see MR lumbar spine report performed same date and dictated separately.  Facet degenerative changes lumbar spine with minimal anterior slip L3.  No abnormal motion between flexion and extension.  Mild curvature lower thoracic lumbar spine convex right.  Aortic Atherosclerosis (ICD10-I70.0).  Electronically  Signed   By: Genia Del M.D.   On: 06/18/2017 20:04 MR LUMBAR SPINE W WO CONTRAST CLINICAL DATA:  Low back pain radiating into the right leg.  EXAM: MRI LUMBAR SPINE WITHOUT AND WITH CONTRAST  TECHNIQUE: Multiplanar and multiecho pulse sequences of the lumbar spine were obtained without and with intravenous contrast.  CONTRAST:  69m MULTIHANCE GADOBENATE DIMEGLUMINE 529 MG/ML IV SOLN  COMPARISON:  Lumbar spine x-rays dated February 10, 2014. MRI lumbar spine report dated December 15, 2010.  FINDINGS: Segmentation:  Standard.  Alignment:  Trace anterolisthesis of L3 on L4, unchanged.  Vertebrae: Mild increased edema and enhancement within the superior endplate of TE45 consistent with subacute compression fracture. There is approximately 25% height loss. No retropulsion. Chronic central superior endplate deformity of the T12 vertebral body. No evidence of discitis or focal bone lesion.  Conus medullaris: Extends to the L1 level and appears normal. No abnormal enhancement identified.  Paraspinal and other soft tissues: Bilateral renal cysts. Otherwise negative.  Disc levels:  T12-L1:  Normal.  L1-L2:  Normal.  L2-L3: Small broad-based disc bulge without spinal canal or neuroforaminal stenosis.  L3-L4: Small posterior disc bulge and bilateral facet arthropathy resulting in mild central spinal canal stenosis and moderate left neuroforaminal stenosis, unchanged. No right neuroforaminal stenosis.  L4-L5: Diffuse disc bulge and bilateral facet arthropathy resulting in moderate central spinal canal stenosis, mild narrowing of the bilateral lateral recesses, and moderate left and mild right neuroforaminal stenosis.  L5-S1: Small diffuse disc bulge and mild bilateral facet arthropathy without spinal canal or neuroforaminal stenosis.  IMPRESSION: 1. Subacute compression deformity of the T11 vertebral body with approximately 25% height loss. 2. Central superior endplate  compression deformity of the T12 vertebral body, new from prior studies, but chronic in appearance. 3. Worsened degenerative disc disease at L4-L5 where there is now moderate central spinal canal stenosis and moderate left neuroforaminal stenosis. 4. Unchanged mild central spinal canal stenosis and moderate left neuroforaminal stenosis at L3-L4.  Electronically Signed   By: Titus Dubin M.D.   On: 06/18/2017 17:30  Complexity Note: Imaging results reviewed. Results shared with [name], using Layman's terms.                         Meds   Current Outpatient Medications:  .  ALPRAZolam (XANAX) 1 MG tablet, Take 1 tablet (1 mg total) by mouth 4 (four) times daily as needed. (Patient taking differently: Take 1 mg by mouth 2 (two) times daily as needed. ), Disp: 30 tablet, Rfl: 0 .  buPROPion (WELLBUTRIN SR) 150 MG 12 hr tablet, Take 150 mg by mouth daily. , Disp: , Rfl:  .  Cholecalciferol (VITAMIN D3) 5000 units TABS, Take 1 tablet (5,000 Units total) by mouth daily., Disp: 30 tablet, Rfl: 2 .  escitalopram (LEXAPRO) 10 MG tablet, Take 10 mg by mouth daily. , Disp: , Rfl:  .  Fluticasone-Salmeterol (ADVAIR) 100-50 MCG/DOSE AEPB, Inhale 1 puff into the lungs every 12 (twelve) hours., Disp: , Rfl:  .  Ipratropium-Albuterol (COMBIVENT RESPIMAT) 20-100 MCG/ACT AERS respimat, Inhale 1 puff into the lungs every 6 (six) hours as needed for wheezing or shortness of breath., Disp: , Rfl:  .  levothyroxine (SYNTHROID, LEVOTHROID) 88 MCG tablet, Take 88 mcg by mouth daily before breakfast. , Disp: , Rfl:  .  [START ON 03/30/2018] magnesium oxide (MAG-OX) 400 MG tablet, Take 1 tablet (400 mg total) by mouth 2 (two) times daily., Disp: 180 tablet, Rfl: 0 .  [START ON 04/29/2018] oxyCODONE (OXY IR/ROXICODONE) 5 MG immediate release tablet, Take 1 tablet (5 mg total) by mouth every 6 (six) hours as needed for severe pain., Disp: 120 tablet, Rfl: 0 .  [START ON 03/30/2018] oxyCODONE (OXY IR/ROXICODONE) 5 MG  immediate release tablet, Take 1 tablet (5 mg total) by mouth every 6 (six) hours as needed for severe pain., Disp: 120 tablet, Rfl: 0 .  [START ON 03/30/2018] tizanidine (ZANAFLEX) 2 MG capsule, Take 1 capsule (2 mg total) by mouth 3 (three) times daily as needed for muscle spasms., Disp: 90 capsule, Rfl: 2 .  traZODone (DESYREL) 150 MG tablet, Take 300 mg by mouth at bedtime. , Disp: , Rfl:  .  [START ON 05/29/2018] oxyCODONE (OXY IR/ROXICODONE) 5 MG immediate release tablet, Take 1 tablet (5 mg total) by mouth every 6 (six) hours as needed for severe pain., Disp: 120 tablet, Rfl: 0  ROS  Constitutional: Denies any fever or chills Gastrointestinal: No reported  hemesis, hematochezia, vomiting, or acute GI distress Musculoskeletal: Denies any acute onset joint swelling, redness, loss of ROM, or weakness Neurological: No reported episodes of acute onset apraxia, aphasia, dysarthria, agnosia, amnesia, paralysis, loss of coordination, or loss of consciousness  Allergies  [name] has No Known Allergies.  [place]  Drug: [name]  reports that she does not use drugs. Alcohol:  reports that she does not drink alcohol. Tobacco:  reports that she has been smoking cigarettes.  She has been smoking about 0.50 packs per day. She has never used smokeless tobacco. Medical:  has a past medical history of Anxiety, CAD (coronary artery disease), Carpal tunnel syndrome, Cataract, Complication of anesthesia, COPD (chronic obstructive pulmonary disease) ([place]), CRP elevated (09/14/2015), Depression, Difficult intubation, Dyslipidemia, Dyspnea, Dysrhythmia, Elevated sedimentation rate (09/14/2015), Esophageal spasm, Gastrointestinal parasites, GERD (gastroesophageal reflux disease), Hiatal hernia, History of peptic ulcer disease, Hyperthyroidism, Hypothyroidism, Low magnesium levels (09/14/2015), Pelvic fracture ([place]) (2008), Reflux, Rotator cuff injury, and Stenosis, spinal, lumbar. Surgical: [name]  has a past surgical  history that includes Cesarean section; Neck surgery; Cholecystectomy; Carpal tunnel release; Rotator cuff repair; Shoulder arthroscopy (12/07/2011); Shoulder surgery (12/07/2011); Intramedullary (im) nail intertrochanteric (Right, 10/01/2016); Fracture surgery; Appendectomy; Nose surgery; Back surgery; Cataract extraction w/PHACO (Right, 08/07/2017); Hip surgery; and Cataract extraction w/PHACO (Left, 09/04/2017). Family: family history includes Aneurysm in her unknown relative.  Constitutional Exam  General appearance: Well nourished, well developed, and well hydrated. In no apparent acute distress Psych/Mental status: Alert, oriented x 3 (person, place, & time)       Eyes: PERLA Respiratory: No evidence of acute respiratory distress   Lumbar Spine Area Exam  Skin & Axial Inspection: No masses, redness, or swelling Alignment: Symmetrical Functional ROM: Unrestricted ROM       Stability: No instability detected Muscle Tone/Strength: Functionally intact. No obvious neuro-muscular anomalies detected. Sensory (Neurological): Unimpaired Palpation: Complains of area being tender to palpation       Provocative Tests: Lumbar Hyperextension and rotation test: evaluation deferred today       Lumbar Lateral bending test: evaluation deferred today       Patrick's Maneuver: evaluation deferred today                    Gait & Posture Assessment  Ambulation: Unassisted Gait: Antalgic slight limp Posture: WNL   Lower Extremity Exam    Side: Right lower extremity  Side: Left lower extremity  Stability: No instability observed          Stability: No instability observed          Skin & Extremity Inspection: Evidence of prior arthroplastic surgery  Skin & Extremity Inspection: Skin color, temperature, and hair growth are WNL. No peripheral edema or cyanosis. No masses, redness, swelling, asymmetry, or associated skin lesions. No contractures.  Functional ROM: Unrestricted ROM                  Functional  ROM: Unrestricted ROM                  Muscle Tone/Strength: Functionally intact. No obvious neuro-muscular anomalies detected.  Muscle Tone/Strength: Functionally intact. No obvious neuro-muscular anomalies detected.  Sensory (Neurological): Unimpaired  Sensory (Neurological): Unimpaired  Palpation: No palpable anomalies  Palpation: No palpable anomalies   Assessment  Primary Diagnosis & Pertinent Problem List: The primary encounter diagnosis was Chronic pain of both hips. Diagnoses of Lumbar spondylosis, Myofascial pain syndrome, cervical, Chronic pain syndrome, Low magnesium levels, and Long term  current use of opiate analgesic were also pertinent to this visit.  Status Diagnosis  Persistent Controlled Controlled 1. Chronic pain of both hips   2. Lumbar spondylosis   3. Myofascial pain syndrome, cervical   4. Chronic pain syndrome   5. Low magnesium levels   6. Long term current use of opiate analgesic     Problems updated and reviewed during this visit: No problems updated. Plan of Care  Pharmacotherapy (Medications Ordered): Meds ordered this encounter  Medications  . oxyCODONE (OXY IR/ROXICODONE) 5 MG immediate release tablet    Sig: Take 1 tablet (5 mg total) by mouth every 6 (six) hours as needed for severe pain.    Dispense:  120 tablet    Refill:  0    Do not place this medication, or any other prescription from our practice, on "Automatic Refill". Patient may have prescription filled one day early if pharmacy is closed on scheduled refill date. Do not fill until:05/29/2018 To last until: 06/28/2018    Order Specific Question:   Supervising Provider    Answer:   Milinda Pointer [phone]  . oxyCODONE (OXY IR/ROXICODONE) 5 MG immediate release tablet    Sig: Take 1 tablet (5 mg total) by mouth every 6 (six) hours as needed for severe pain.    Dispense:  120 tablet    Refill:  0    Do not place this medication, or any other prescription from our practice, on "Automatic  Refill". Patient may have prescription filled one day early if pharmacy is closed on scheduled refill date. Do not fill until:04/29/2018 To last until:05/29/2018    Order Specific Question:   Supervising Provider    Answer:   Milinda Pointer [phone]  . oxyCODONE (OXY IR/ROXICODONE) 5 MG immediate release tablet    Sig: Take 1 tablet (5 mg total) by mouth every 6 (six) hours as needed for severe pain.    Dispense:  120 tablet    Refill:  0    Do not place this medication, or any other prescription from our practice, on "Automatic Refill". Patient may have prescription filled one day early if pharmacy is closed on scheduled refill date. Do not fill until: 03/30/2018 To last until: 04/29/2018    Order Specific Question:   Supervising Provider    Answer:   Milinda Pointer [phone]  . magnesium oxide (MAG-OX) 400 MG tablet    Sig: Take 1 tablet (400 mg total) by mouth 2 (two) times daily.    Dispense:  180 tablet    Refill:  0    Do not place medication on "Automatic Refill". Fill one day early if pharmacy is closed on scheduled refill date.    Order Specific Question:   Supervising Provider    Answer:   Milinda Pointer [phone]  . tizanidine (ZANAFLEX) 2 MG capsule    Sig: Take 1 capsule (2 mg total) by mouth 3 (three) times daily as needed for muscle spasms.    Dispense:  90 capsule    Refill:  2    Do not place this medication, or any other prescription from our practice, on "Automatic Refill". Patient may have prescription filled one day early if pharmacy is closed on scheduled refill date.    Order Specific Question:   Supervising Provider    Answer:   Milinda Pointer [[id number]]   New Prescriptions   No medications on file   Medications administered today: [employer] had no medications administered during this visit. Lab-work, procedure(s),  and/or referral(s): Orders Placed This Encounter  Procedures  . ToxASSURE Select 13 (MW), Urine   Imaging and/or  referral(s): None  Interventional management options: Planned, scheduled, and/or pending: Not at this time.   Considering: Diagnostic right-sidedlumbarfacet + right-sided sacroiliac joint block#2 Diagnostic right-sided sacroiliac joint block  Possible right sided sacroiliac joint RFA Diagnostic right-sided lumbar facet block  Possible right-sided lumbar facet RFA Diagnostic bilateral cervical facetblock  Possible bilateral cervical facet radiofrequencyablation  Diagnostic right-sided cervical epidural steroid injection  Diagnostic bilateral intra-articular shoulderjoint injection  Diagnostic bilateral suprascapularnerve block  Possible bilateral suprascapular nerve radiofrequencyablation  Diagnostic left-sided L3-4 transforaminal epiduralsteroid injection  Diagnostic L3-4 versus L4-5 lumbar epiduralsteroid injection    Palliative PRN treatment(s): Palliative right-sided sacroiliac joint block Palliative right-sided lumbar facet block      Provider-requested follow-up: Return in about 3 months (around 06/28/2018) for [facility] with [facility]).  Future Appointments  Date Time Provider [place]  06/24/2018  1:45 PM Vevelyn Francois, NP [facility] None   Primary Care Physician: Albina Billet, MD Location: [facility] Note by: Vevelyn Francois NP Date: 03/28/2018; Time: 3:08 PM  Pain Score Disclaimer: We use the NRS-11 scale. This is a self-reported, subjective measurement of pain severity with only modest accuracy. It is used primarily to identify changes within a particular patient. It must be understood that outpatient pain scales are significantly less accurate that those used for research, where they can be applied under ideal controlled circumstances with minimal exposure to variables. In reality, the score is likely to be a combination of pain intensity and pain affect, where pain affect describes the degree of  emotional arousal or changes in action readiness caused by the sensory experience of pain. Factors such as social and work situation, setting, emotional state, anxiety levels, expectation, and prior pain experience may influence pain perception and show large inter-individual differences that may also be affected by time variables.  Patient instructions provided during this appointment: Patient Instructions  __You have been given here prescription for Oxycodone to last until 06/28/18. Magnesium and Zanaflex sent to your pharmacy.  __________________________________________________________________________________________  Medication Rules  Applies to: All patients receiving prescriptions (written or electronic).  Pharmacy of record: Pharmacy where electronic prescriptions will be sent. If written prescriptions are taken to a different pharmacy, please inform the nursing staff. The pharmacy listed in the electronic medical record should be the one where you would like electronic prescriptions to be sent.  Prescription refills: Only during scheduled appointments. Applies to both, written and electronic prescriptions.  NOTE: The following applies primarily to controlled substances (Opioid* Pain Medications).   Patient's responsibilities: 1. Pain Pills: Bring all pain pills to every appointment (except for procedure appointments). 2. Pill Bottles: Bring pills in original pharmacy bottle. Always bring newest bottle. Bring bottle, even if empty. 3. Medication refills: You are responsible for knowing and keeping track of what medications you need refilled. The day before your appointment, write a list of all prescriptions that need to be refilled. Bring that list to your appointment and give it to the admitting nurse. Prescriptions will be written only during appointments. If you forget a medication, it will not be "Called in", "Faxed", or "electronically sent". You will need to get another appointment to  get these prescribed. 4. Prescription Accuracy: You are responsible for carefully inspecting your prescriptions before leaving our office. Have the discharge nurse carefully go over each prescription with you, before taking them home. Make sure that your  name is accurately spelled, that your address is correct. Check the name and dose of your medication to make sure it is accurate. Check the number of pills, and the written instructions to make sure they are clear and accurate. Make sure that you are given enough medication to last until your next medication refill appointment. 5. Taking Medication: Take medication as prescribed. Never take more pills than instructed. Never take medication more frequently than prescribed. Taking less pills or less frequently is permitted and encouraged, when it comes to controlled substances (written prescriptions).  6. Inform other Doctors: Always inform, all of your healthcare providers, of all the medications you take. 7. Pain Medication from other Providers: You are not allowed to accept any additional pain medication from any other Doctor or Healthcare provider. There are two exceptions to this rule. (see below) In the event that you require additional pain medication, you are responsible for notifying us, as stated below. 8. Medication Agreement: You are responsible for carefully reading and following our Medication Agreement. This must be signed before receiving any prescriptions from our practice. Safely store a copy of your signed Agreement. Violations to the Agreement will result in no further prescriptions. (Additional copies of our Medication Agreement are available upon request.) 9. Laws, Rules, & Regulations: All patients are expected to follow all Federal and [employer], Rules, [employer]. Ignorance of the Laws does not constitute a valid excuse. The use of any illegal substances is prohibited. 10. Adopted CDC guidelines & recommendations: Target  dosing levels will be at or below 60 MME/day. Use of benzodiazepines** is not recommended.  Exceptions: There are only two exceptions to the rule of not receiving pain medications from other Healthcare Providers. 1. Exception #1 (Emergencies): In the event of an emergency (i.e.: accident requiring emergency care), you are allowed to receive additional pain medication. However, you are responsible for: As soon as you are able, call our office [phone], at any time of the day or night, and leave a message stating your name, the date and nature of the emergency, and the name and dose of the medication prescribed. In the event that your call is answered by a member of our staff, make sure to document and save the date, time, and the name of the person that took your information.  2. Exception #2 (Planned Surgery): In the event that you are scheduled by another doctor or dentist to have any type of surgery or procedure, you are allowed (for a period no longer than 30 days), to receive additional pain medication, for the acute post-op pain. However, in this case, you are responsible for picking up a copy of our "Post-op Pain Management for Surgeons" handout, and giving it to your surgeon or dentist. This document is available at our office, and does not require an appointment to obtain it. Simply go to our office during business hours (Monday-Thursday from 8:00 AM to 4:00 PM) (Friday 8:00 AM to 12:00 Noon) or if you have a scheduled appointment with [place], prior to your surgery, and ask for it by name. In addition, you will need to provide [place] with your name, name of your surgeon, type of surgery, and date of procedure or surgery.  *Opioid medications include: morphine, codeine, oxycodone, oxymorphone, hydrocodone, hydromorphone, meperidine, tramadol, tapentadol, buprenorphine, fentanyl, methadone. **Benzodiazepine medications include: diazepam (Valium), alprazolam (Xanax), clonazepam (Klonopine), lorazepam  (Ativan), clorazepate (Tranxene), chlordiazepoxide (Librium), estazolam (Prosom), oxazepam (Serax), temazepam (Restoril), triazolam (Halcion) (Last updated: 01/17/2018) ____________________________________________________________________________________________

## 2018-04-03 LAB — TOXASSURE SELECT 13 (MW), URINE

## 2018-06-11 DIAGNOSIS — H43813 Vitreous degeneration, bilateral: Secondary | ICD-10-CM | POA: Diagnosis not present

## 2018-06-13 DIAGNOSIS — E038 Other specified hypothyroidism: Secondary | ICD-10-CM | POA: Diagnosis not present

## 2018-06-13 DIAGNOSIS — J449 Chronic obstructive pulmonary disease, unspecified: Secondary | ICD-10-CM | POA: Diagnosis not present

## 2018-06-13 DIAGNOSIS — E785 Hyperlipidemia, unspecified: Secondary | ICD-10-CM | POA: Diagnosis not present

## 2018-06-13 DIAGNOSIS — I10 Essential (primary) hypertension: Secondary | ICD-10-CM | POA: Diagnosis not present

## 2018-06-24 ENCOUNTER — Ambulatory Visit: Payer: Medicare HMO | Attending: Nurse Practitioner | Admitting: Nurse Practitioner

## 2018-06-24 NOTE — Progress Notes (Deleted)
Patient's Name: Yaeli Hartung  MRN: [id number]  Referring Provider: Albina Billet, MD  DOB: Jan 10, 1946  PCP: Albina Billet, MD  DOS: 06/24/2018  Note by: Vevelyn Francois NP  Service setting: Ambulatory outpatient  Specialty: Interventional Pain Management  Location: [facility] (AMB) Pain Management Facility    Patient type: Established    Primary Reason(s) for Visit: Encounter for prescription drug management. (Level of risk: moderate)  CC: No chief complaint on file.  HPI  Ms. Hopwood is a 72 y.o. year old, female patient, who comes today for a medication management evaluation. 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); Cervical facet syndrome (Bilateral) (R>L); Cervical myofascial pain syndrome; Lumbar spondylosis; Chronic shoulder impingement syndrome (Right); Low magnesium levels; CRP elevated; Elevated sedimentation rate; Chronic obstructive pulmonary disease (COPD) ([place]); Nicotine dependence; Chronic shoulder pain (Right); Abnormal nerve conduction studies; Chronic shoulder pain (Bilateral) (status post multiple surgeries) (R>L); Cervical facet hypertrophy (Bilateral); History of shoulder surgery 5 (Right); Lumbar foraminal stenosis (L3-4) (Left); 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 Her primarily concern today is the No chief complaint on file.  Pain Assessment: Location:     Radiating:   Onset:   Duration:   Quality:   Severity:  /10 (subjective, self-reported pain score)  Note: Reported level is compatible with observation.                         When using our objective Pain Scale, levels between 6 and 10/10 are said to belong in an emergency room, as it progressively worsens from a 6/10, described as severely limiting, requiring emergency care not usually available at an outpatient pain management facility. At a 6/10 level, communication becomes difficult and requires great effort. Assistance to reach the emergency department may be required. Facial flushing and profuse sweating along with potentially dangerous increases in heart rate and blood pressure will be evident. Effect on ADL:   Timing:   Modifying factors:   BP:    HR:    Ms. Mchaney was last scheduled for an appointment on 03/28/2018 for medication management. During today's appointment we reviewed Ms. Noto chronic pain status, as well as her outpatient medication regimen.  The patient  reports that she does not use drugs. Her body mass index is unknown because there is no height or weight on file.  Further details on both, my assessment(s), as well as the proposed treatment plan, please see below.  Controlled Substance Pharmacotherapy Assessment REMS (Risk Evaluation and Mitigation Strategy)  Analgesic: *** MME/day: *** mg/day.  No notes on file Pharmacokinetics: Liberation and absorption (onset of action): WNL Distribution (time to peak effect): WNL Metabolism and excretion (duration of action): WNL  Pharmacodynamics: Desired effects: Analgesia: Ms. Gloss  reports >50% benefit. Functional ability: Patient reports that medication allows her to accomplish basic ADLs Clinically meaningful improvement in function (CMIF): Sustained CMIF goals met Perceived effectiveness: Described as relatively effective, allowing for increase in activities of daily living (ADL) Undesirable effects: Side-effects or Adverse reactions: None reported Monitoring: [place] PMP: Online review of the past 12-month period conducted. Compliant with practice rules and regulations Last UDS on record: Summary  Date Value Ref Range Status  03/28/2018 FINAL  Final    Comment:    ==================================================================== TOXASSURE SELECT 13 (MW) ==================================================================== Test                             Result       Flag       Units Drug Present and Declared for Prescription Verification   Alprazolam                     103          EXPECTED   ng/mg creat   Alpha-hydroxyalprazolam        >717         EXPECTED   ng/mg creat    Source of alprazolam is a scheduled prescription medication.    Alpha-hydroxyalprazolam is an expected metabolite of alprazolam.   Oxycodone                      33           EXPECTED   ng/mg creat   Oxymorphone                    34           EXPECTED   ng/mg creat   Noroxycodone                   1035         EXPECTED   ng/mg creat   Noroxymorphone                 39           EXPECTED   ng/mg creat    Sources of oxycodone are scheduled prescription medications.    Oxymorphone, noroxycodone, and noroxymorphone are expected    metabolites of oxycodone. Oxymorphone is also available as a    scheduled prescription medication. ==================================================================== Test                      Result    Flag   Units      Ref Range   Creatinine              279              mg/dL       >=20 ==================================================================== Declared Medications:  The flagging and interpretation on this report are based on the  following declared medications.  Unexpected results may arise from  inaccuracies in the declared medications.  **Note: The testing scope of this panel includes these medications:  Alprazolam (Xanax)  Oxycodone (Oxy-IR)  Oxycodone (Roxicodone)  **Note: The testing scope of this panel does not include following  reported medications:  Albuterol (Combivent)  Bupropion (Wellbutrin)  Escitalopram (Lexapro)  Fluticasone (Advair)  Ipratropium (Combivent)  Levothyroxine  Magnesium (Mag-Ox)  Salmeterol (Advair)  Tizanidine (Zanaflex)  Trazodone (Desyrel)  Vitamin D3 ==================================================================== For clinical consultation, please call [phone]. ====================================================================    UDS interpretation: Compliant          Medication Assessment Form: Reviewed. Patient indicates being compliant with therapy Treatment compliance: Compliant Risk Assessment Profile: Aberrant behavior: See prior evaluations. None observed or detected today Comorbid factors increasing risk of overdose: See prior notes. No additional risks detected today Risk of substance use disorder (SUD): Low  ORT Scoring interpretation table:  Score <3 = Low Risk for SUD  Score between 4-7 = Moderate Risk for SUD  Score >8 = High Risk for Opioid Abuse   Risk Mitigation Strategies:  Patient Counseling: Covered Patient-Prescriber Agreement (PPA): Present and active  Notification to other healthcare providers: Done  Pharmacologic Plan: No change in therapy, at this time.             Laboratory Chemistry  Inflammation Markers (CRP: Acute Phase) (ESR: Chronic Phase) Lab Results  Component Value Date   CRP 14.3 (H) 06/05/2017   ESRSEDRATE 9 06/05/2017                          Rheumatology Markers Lab Results  Component Value Date   LABURIC 3.7 10/27/2008                        Renal Function Markers Lab Results  Component Value Date   BUN 14 06/05/2017   CREATININE 0.68 06/05/2017   BCR 21 06/05/2017   GFRAA 102 06/05/2017   GFRNONAA 89 06/05/2017                             Hepatic Function Markers Lab Results  Component Value Date   AST 17 06/05/2017   ALT 14 06/05/2017   ALBUMIN 3.6 06/05/2017   ALKPHOS 124 (H) 06/05/2017                        Electrolytes Lab Results  Component Value Date   NA 144 06/05/2017   K 4.0 06/05/2017   CL 104 06/05/2017   CALCIUM 9.0 06/05/2017   MG 1.7 06/05/2017   PHOS 2.9 10/02/2016                        Neuropathy Markers Lab Results  Component Value Date   VITAMINB12 282 06/05/2017                        Bone Pathology Markers Lab Results  Component Value Date   25OHVITD1 28 (L) 06/05/2017   25OHVITD2 3.0 06/05/2017   25OHVITD3 25 06/05/2017                         Coagulation Parameters Lab Results  Component Value Date   INR 1.08 10/01/2016   LABPROT 14.0 10/01/2016   PLT 405 02/20/2017                        Cardiovascular Markers Lab Results  Component Value Date   CKTOTAL 372 (H) 10/01/2016   TROPONINI <0.03 10/01/2016   HGB 11.5 (L) 02/20/2017   HCT 35.0 02/20/2017                         CA Markers No results found for: CEA, CA125, LABCA2                        Note: Lab results reviewed.  Recent Diagnostic Imaging Results  DG Lumbar Spine Complete W/Bend CLINICAL DATA:  72 year old female fractured hip in November with low back pain since. Initial encounter.  EXAM: LUMBAR SPINE - COMPLETE WITH BENDING VIEWS  COMPARISON:  02/10/2014.  FINDINGS: New from the 2015 lumbar spine plain film examination are T11 and T12 superior endplate compression fractures with 25-30% loss height. There is also a new L5 superior endplate compression fracture with 20% loss of  height. Please see MR lumbar spine report performed same date and dictated separately.  Facet degenerative changes lumbar spine with minimal anterior slip L3. No abnormal motion between flexion and extension.  No pars defect.  Mild curvature lower thoracic lumbar spine convex right.  Vascular calcifications.  IMPRESSION: New from the 2015 lumbar spine plain film examination are T11 and T12 superior endplate compression fractures with 25-30% loss height. There is also a new L5 superior endplate compression fracture with 20% loss of height. Please see MR lumbar spine report performed same date and dictated separately.  Facet degenerative changes lumbar spine with minimal anterior slip L3.  No abnormal motion between flexion and extension.  Mild curvature lower thoracic lumbar spine convex right.  Aortic Atherosclerosis (ICD10-I70.0).  Electronically Signed   By: Genia Del M.D.   On: 06/18/2017 20:04 MR LUMBAR SPINE W WO CONTRAST CLINICAL DATA:  Low back pain radiating into the right leg.  EXAM: MRI LUMBAR SPINE WITHOUT AND WITH CONTRAST  TECHNIQUE: Multiplanar and multiecho pulse sequences of the lumbar spine were obtained without and with intravenous contrast.  CONTRAST:  10m MULTIHANCE GADOBENATE DIMEGLUMINE 529 MG/ML IV SOLN  COMPARISON:  Lumbar spine x-rays dated February 10, 2014. MRI lumbar spine report dated December 15, 2010.  FINDINGS: Segmentation:  Standard.  Alignment:  Trace anterolisthesis of L3 on L4, unchanged.  Vertebrae: Mild increased edema and enhancement within the superior endplate of TX41 consistent with subacute compression fracture. There is approximately 25% height loss. No retropulsion. Chronic central superior endplate deformity of the T12 vertebral body. No evidence of discitis or focal bone lesion.  Conus medullaris: Extends to the L1 level and appears normal. No abnormal enhancement identified.  Paraspinal and other soft  tissues: Bilateral renal cysts. Otherwise negative.  Disc levels:  T12-L1:  Normal.  L1-L2:  Normal.  L2-L3: Small broad-based disc bulge without spinal canal or neuroforaminal stenosis.  L3-L4: Small posterior disc bulge and bilateral facet arthropathy resulting in mild central spinal canal stenosis and moderate left neuroforaminal stenosis, unchanged. No right neuroforaminal stenosis.  L4-L5: Diffuse disc bulge and bilateral facet arthropathy resulting in moderate central spinal canal stenosis, mild narrowing of the bilateral lateral recesses, and moderate left and mild right neuroforaminal stenosis.  L5-S1: Small diffuse disc bulge and mild bilateral facet arthropathy without spinal canal or neuroforaminal stenosis.  IMPRESSION: 1. Subacute compression deformity of the T11 vertebral body with approximately 25% height loss. 2. Central superior endplate compression deformity of the T12 vertebral body, new from prior studies, but chronic in appearance. 3. Worsened degenerative disc disease at L4-L5 where there is now moderate central spinal canal stenosis and moderate left neuroforaminal stenosis. 4. Unchanged mild central spinal canal stenosis and moderate left neuroforaminal stenosis at L3-L4.  Electronically Signed   By: WTitus DubinM.D.   On: 06/18/2017 17:30  Complexity Note: Imaging results reviewed. Results shared with Ms. PLangenfeld using Layman's terms.  Meds   Current Outpatient Medications:  .  ALPRAZolam (XANAX) 1 MG tablet, Take 1 tablet (1 mg total) by mouth 4 (four) times daily as needed. (Patient taking differently: Take 1 mg by mouth 2 (two) times daily as needed. ), Disp: 30 tablet, Rfl: 0 .  buPROPion (WELLBUTRIN SR) 150 MG 12 hr tablet, Take 150 mg by mouth daily. , Disp: , Rfl:  .  Cholecalciferol (VITAMIN D3) 5000 units TABS, Take 1 tablet (5,000 Units total) by mouth daily., Disp: 30 tablet, Rfl: 2 .  escitalopram (LEXAPRO)  10 MG tablet, Take 10 mg by mouth daily. , Disp: , Rfl:  .  Fluticasone-Salmeterol (ADVAIR) 100-50 MCG/DOSE AEPB, Inhale 1 puff into the lungs every 12 (twelve) hours., Disp: , Rfl:  .  Ipratropium-Albuterol (COMBIVENT RESPIMAT) 20-100 MCG/ACT AERS respimat, Inhale 1 puff into the lungs every 6 (six) hours as needed for wheezing or shortness of breath., Disp: , Rfl:  .  levothyroxine (SYNTHROID, LEVOTHROID) 88 MCG tablet, Take 88 mcg by mouth daily before breakfast. , Disp: , Rfl:  .  magnesium oxide (MAG-OX) 400 MG tablet, Take 1 tablet (400 mg total) by mouth 2 (two) times daily., Disp: 180 tablet, Rfl: 0 .  oxyCODONE (OXY IR/ROXICODONE) 5 MG immediate release tablet, Take 1 tablet (5 mg total) by mouth every 6 (six) hours as needed for severe pain., Disp: 120 tablet, Rfl: 0 .  oxyCODONE (OXY IR/ROXICODONE) 5 MG immediate release tablet, Take 1 tablet (5 mg total) by mouth every 6 (six) hours as needed for severe pain., Disp: 120 tablet, Rfl: 0 .  oxyCODONE (OXY IR/ROXICODONE) 5 MG immediate release tablet, Take 1 tablet (5 mg total) by mouth every 6 (six) hours as needed for severe pain., Disp: 120 tablet, Rfl: 0 .  tizanidine (ZANAFLEX) 2 MG capsule, Take 1 capsule (2 mg total) by mouth 3 (three) times daily as needed for muscle spasms., Disp: 90 capsule, Rfl: 2 .  traZODone (DESYREL) 150 MG tablet, Take 300 mg by mouth at bedtime. , Disp: , Rfl:   ROS  Constitutional: Denies any fever or chills Gastrointestinal: No reported hemesis, hematochezia, vomiting, or acute GI distress Musculoskeletal: Denies any acute onset joint swelling, redness, loss of ROM, or weakness Neurological: No reported episodes of acute onset apraxia, aphasia, dysarthria, agnosia, amnesia, paralysis, loss of coordination, or loss of consciousness  Allergies  Ms. Mountz has No Known Allergies.  [place]  Drug: Ms. Maxham  reports that she does not use drugs. Alcohol:  reports that she does not drink alcohol. Tobacco:  reports  that she has been smoking cigarettes.  She has been smoking about 0.50 packs per day. She has never used smokeless tobacco. Medical:  has a past medical history of Anxiety, CAD (coronary artery disease), Carpal tunnel syndrome, Cataract, Complication of anesthesia, COPD (chronic obstructive pulmonary disease) ([place]), CRP elevated (09/14/2015), Depression, Difficult intubation, Dyslipidemia, Dyspnea, Dysrhythmia, Elevated sedimentation rate (09/14/2015), Esophageal spasm, Gastrointestinal parasites, GERD (gastroesophageal reflux disease), Hiatal hernia, History of peptic ulcer disease, Hyperthyroidism, Hypothyroidism, Low magnesium levels (09/14/2015), Pelvic fracture ([place]) (2008), Reflux, Rotator cuff injury, and Stenosis, spinal, lumbar. Surgical: Ms. Erlich  has a past surgical history that includes Cesarean section; Neck surgery; Cholecystectomy; Carpal tunnel release; Rotator cuff repair; Shoulder arthroscopy (12/07/2011); Shoulder surgery (12/07/2011); Intramedullary (im) nail intertrochanteric (Right, 10/01/2016); Fracture surgery; Appendectomy; Nose surgery; Back surgery; Cataract extraction w/PHACO (Right, 08/07/2017); Hip surgery; and Cataract extraction w/PHACO (Left, 09/04/2017). Family: family history includes Aneurysm in her unknown relative.  Constitutional Exam  General appearance: Well nourished, well developed, and well hydrated. In no apparent acute distress There were no vitals filed for this visit. BMI Assessment: Estimated body mass index is 22.31 kg/m as calculated from the following:   Height as of 03/28/18: 5' 4" (1.626 m).   Weight as of 03/28/18: 130 lb (59 kg).  BMI interpretation table: BMI level Category Range association with higher incidence of chronic pain  <18 kg/m2 Underweight   18.5-24.9 kg/m2 Ideal body weight   25-29.9 kg/m2 Overweight Increased incidence by 20%  30-34.9 kg/m2 Obese (Class I) Increased incidence by 68%  35-39.9 kg/m2 Severe obesity (Class II) Increased  incidence by 136%  >40 kg/m2 Extreme obesity (Class III) Increased incidence by 254%   Patient's current BMI Ideal Body weight  There is no height or weight on file to calculate BMI. Patient weight not recorded   BMI Readings from Last 4 Encounters:  03/28/18 22.31 kg/m  12/31/17 20.60 kg/m  10/02/17 18.88 kg/m  09/04/17 18.54 kg/m   Wt Readings from Last 4 Encounters:  03/28/18 130 lb (59 kg)  12/31/17 120 lb (54.4 kg)  10/02/17 110 lb (49.9 kg)  09/04/17 108 lb (49 kg)  Psych/Mental status: Alert, oriented x 3 (person, place, & time)       Eyes: PERLA Respiratory: No evidence of acute respiratory distress  Cervical Spine Area Exam  Skin & Axial Inspection: No masses, redness, edema, swelling, or associated skin lesions Alignment: Symmetrical Functional ROM: Unrestricted ROM      Stability: No instability detected Muscle Tone/Strength: Functionally intact. No obvious neuro-muscular anomalies detected. Sensory (Neurological): Unimpaired Palpation: No palpable anomalies              Upper Extremity (UE) Exam    Side: Right upper extremity  Side: Left upper extremity  Skin & Extremity Inspection: Skin color, temperature, and hair growth are WNL. No peripheral edema or cyanosis. No masses, redness, swelling, asymmetry, or associated skin lesions. No contractures.  Skin & Extremity Inspection: Skin color, temperature, and hair growth are WNL. No peripheral edema or cyanosis. No masses, redness, swelling, asymmetry, or associated skin lesions. No contractures.  Functional ROM: Unrestricted ROM          Functional ROM: Unrestricted ROM          Muscle Tone/Strength: Functionally intact. No obvious neuro-muscular anomalies detected.  Muscle Tone/Strength: Functionally intact. No obvious neuro-muscular anomalies detected.  Sensory (Neurological): Unimpaired          Sensory (Neurological): Unimpaired          Palpation: No palpable anomalies              Palpation: No palpable  anomalies              Provocative Test(s):  Phalen's test: deferred Tinel's test: deferred Apley's scratch test (touch opposite shoulder):  Action 1 (Across chest): deferred Action 2 (Overhead): deferred Action 3 (LB reach): deferred   Provocative Test(s):  Phalen's test: deferred Tinel's test: deferred Apley's scratch test (touch opposite shoulder):  Action 1 (Across chest): deferred Action 2 (Overhead): deferred Action 3 (LB reach): deferred    Thoracic Spine Area Exam  Skin & Axial Inspection: No masses, redness, or swelling Alignment: Symmetrical Functional ROM: Unrestricted ROM Stability: No instability detected Muscle Tone/Strength: Functionally intact. No obvious neuro-muscular anomalies detected. Sensory (Neurological): Unimpaired Muscle strength & Tone: No palpable anomalies  Lumbar Spine Area Exam  Skin & Axial Inspection: No masses, redness, or swelling Alignment: Symmetrical Functional ROM:  Unrestricted ROM       Stability: No instability detected Muscle Tone/Strength: Functionally intact. No obvious neuro-muscular anomalies detected. Sensory (Neurological): Unimpaired Palpation: No palpable anomalies       Provocative Tests: Hyperextension/rotation test: deferred today       Lumbar quadrant test (Kemp's test): deferred today       Lateral bending test: deferred today       Patrick's Maneuver: deferred today                   FABER test: deferred today                   S-I anterior distraction/compression test: deferred today         S-I lateral compression test: deferred today         S-I Thigh-thrust test: deferred today         S-I Gaenslen's test: deferred today          Gait & Posture Assessment  Ambulation: Unassisted Gait: Relatively normal for age and body habitus Posture: WNL   Lower Extremity Exam    Side: Right lower extremity  Side: Left lower extremity  Stability: No instability observed          Stability: No instability observed           Skin & Extremity Inspection: Skin color, temperature, and hair growth are WNL. No peripheral edema or cyanosis. No masses, redness, swelling, asymmetry, or associated skin lesions. No contractures.  Skin & Extremity Inspection: Skin color, temperature, and hair growth are WNL. No peripheral edema or cyanosis. No masses, redness, swelling, asymmetry, or associated skin lesions. No contractures.  Functional ROM: Unrestricted ROM                  Functional ROM: Unrestricted ROM                  Muscle Tone/Strength: Functionally intact. No obvious neuro-muscular anomalies detected.  Muscle Tone/Strength: Functionally intact. No obvious neuro-muscular anomalies detected.  Sensory (Neurological): Unimpaired  Sensory (Neurological): Unimpaired  Palpation: No palpable anomalies  Palpation: No palpable anomalies   Assessment  Primary Diagnosis & Pertinent Problem List: The primary encounter diagnosis was Cervical spondylosis. Diagnoses of Lumbar spondylosis and Chronic sacroiliac joint pain (Right) were also pertinent to this visit.  Status Diagnosis  Controlled Controlled Controlled 1. Cervical spondylosis   2. Lumbar spondylosis   3. Chronic sacroiliac joint pain (Right)     Problems updated and reviewed during this visit: No problems updated. Plan of Care  Pharmacotherapy (Medications Ordered): No orders of the defined types were placed in this encounter.  New Prescriptions   No medications on file   Medications administered today: Gregary Signs A. Roeper had no medications administered during this visit. Lab-work, procedure(s), and/or referral(s): No orders of the defined types were placed in this encounter.  Imaging and/or referral(s): None  Interventional therapies: Planned, scheduled, and/or pending:   Not at this time.   Considering:   ***   Palliative PRN treatment(s):   ***   Provider-requested follow-up: No follow-ups on file.  No future appointments. Primary Care  Physician: Albina Billet, MD Location: [facility] Note by: Vevelyn Francois NP Date: 06/24/2018; Time: 2:01 PM  Pain Score Disclaimer: We use the NRS-11 scale. This is a self-reported, subjective measurement of pain severity with only modest accuracy. It is used primarily to identify changes within a particular patient. It must be  understood that outpatient pain scales are significantly less accurate that those used for research, where they can be applied under ideal controlled circumstances with minimal exposure to variables. In reality, the score is likely to be a combination of pain intensity and pain affect, where pain affect describes the degree of emotional arousal or changes in action readiness caused by the sensory experience of pain. Factors such as social and work situation, setting, emotional state, anxiety levels, expectation, and prior pain experience may influence pain perception and show large inter-individual differences that may also be affected by time variables.  Patient instructions provided during this appointment: There are no Patient Instructions on file for this visit.   

## 2018-06-27 ENCOUNTER — Telehealth: Payer: Self-pay | Admitting: Nurse Practitioner

## 2018-06-27 NOTE — Telephone Encounter (Signed)
Error

## 2018-07-01 ENCOUNTER — Other Ambulatory Visit: Payer: Self-pay

## 2018-07-01 ENCOUNTER — Encounter: Payer: Self-pay | Admitting: Nurse Practitioner

## 2018-07-01 ENCOUNTER — Ambulatory Visit: Payer: Medicare HMO | Attending: Nurse Practitioner | Admitting: Nurse Practitioner

## 2018-07-01 VITALS — BP 134/74 | HR 111 | Temp 98.2°F | Resp 16 | Ht 64.0 in | Wt 125.0 lb

## 2018-07-01 DIAGNOSIS — M542 Cervicalgia: Secondary | ICD-10-CM | POA: Insufficient documentation

## 2018-07-01 DIAGNOSIS — E785 Hyperlipidemia, unspecified: Secondary | ICD-10-CM | POA: Insufficient documentation

## 2018-07-01 DIAGNOSIS — E039 Hypothyroidism, unspecified: Secondary | ICD-10-CM | POA: Insufficient documentation

## 2018-07-01 DIAGNOSIS — R790 Abnormal level of blood mineral: Secondary | ICD-10-CM | POA: Diagnosis not present

## 2018-07-01 DIAGNOSIS — M4854XA Collapsed vertebra, not elsewhere classified, thoracic region, initial encounter for fracture: Secondary | ICD-10-CM | POA: Insufficient documentation

## 2018-07-01 DIAGNOSIS — M25551 Pain in right hip: Secondary | ICD-10-CM | POA: Diagnosis not present

## 2018-07-01 DIAGNOSIS — M545 Low back pain: Secondary | ICD-10-CM | POA: Diagnosis not present

## 2018-07-01 DIAGNOSIS — M4856XA Collapsed vertebra, not elsewhere classified, lumbar region, initial encounter for fracture: Secondary | ICD-10-CM | POA: Insufficient documentation

## 2018-07-01 DIAGNOSIS — M7918 Myalgia, other site: Secondary | ICD-10-CM | POA: Insufficient documentation

## 2018-07-01 DIAGNOSIS — G8929 Other chronic pain: Secondary | ICD-10-CM

## 2018-07-01 DIAGNOSIS — Z79899 Other long term (current) drug therapy: Secondary | ICD-10-CM | POA: Diagnosis not present

## 2018-07-01 DIAGNOSIS — K449 Diaphragmatic hernia without obstruction or gangrene: Secondary | ICD-10-CM | POA: Insufficient documentation

## 2018-07-01 DIAGNOSIS — J449 Chronic obstructive pulmonary disease, unspecified: Secondary | ICD-10-CM | POA: Diagnosis not present

## 2018-07-01 DIAGNOSIS — I700 Atherosclerosis of aorta: Secondary | ICD-10-CM | POA: Insufficient documentation

## 2018-07-01 DIAGNOSIS — M48061 Spinal stenosis, lumbar region without neurogenic claudication: Secondary | ICD-10-CM | POA: Insufficient documentation

## 2018-07-01 DIAGNOSIS — K224 Dyskinesia of esophagus: Secondary | ICD-10-CM | POA: Insufficient documentation

## 2018-07-01 DIAGNOSIS — F419 Anxiety disorder, unspecified: Secondary | ICD-10-CM | POA: Insufficient documentation

## 2018-07-01 DIAGNOSIS — M25511 Pain in right shoulder: Secondary | ICD-10-CM | POA: Insufficient documentation

## 2018-07-01 DIAGNOSIS — G894 Chronic pain syndrome: Secondary | ICD-10-CM | POA: Diagnosis not present

## 2018-07-01 DIAGNOSIS — E538 Deficiency of other specified B group vitamins: Secondary | ICD-10-CM | POA: Diagnosis not present

## 2018-07-01 DIAGNOSIS — M25572 Pain in left ankle and joints of left foot: Secondary | ICD-10-CM | POA: Diagnosis not present

## 2018-07-01 DIAGNOSIS — F329 Major depressive disorder, single episode, unspecified: Secondary | ICD-10-CM | POA: Insufficient documentation

## 2018-07-01 DIAGNOSIS — G5600 Carpal tunnel syndrome, unspecified upper limb: Secondary | ICD-10-CM | POA: Insufficient documentation

## 2018-07-01 DIAGNOSIS — M25512 Pain in left shoulder: Secondary | ICD-10-CM | POA: Diagnosis not present

## 2018-07-01 DIAGNOSIS — M533 Sacrococcygeal disorders, not elsewhere classified: Secondary | ICD-10-CM

## 2018-07-01 DIAGNOSIS — I2510 Atherosclerotic heart disease of native coronary artery without angina pectoris: Secondary | ICD-10-CM | POA: Insufficient documentation

## 2018-07-01 DIAGNOSIS — Z79891 Long term (current) use of opiate analgesic: Secondary | ICD-10-CM | POA: Insufficient documentation

## 2018-07-01 DIAGNOSIS — K219 Gastro-esophageal reflux disease without esophagitis: Secondary | ICD-10-CM | POA: Insufficient documentation

## 2018-07-01 DIAGNOSIS — E559 Vitamin D deficiency, unspecified: Secondary | ICD-10-CM | POA: Insufficient documentation

## 2018-07-01 DIAGNOSIS — M546 Pain in thoracic spine: Secondary | ICD-10-CM | POA: Diagnosis not present

## 2018-07-01 DIAGNOSIS — F17210 Nicotine dependence, cigarettes, uncomplicated: Secondary | ICD-10-CM | POA: Diagnosis not present

## 2018-07-01 DIAGNOSIS — M25552 Pain in left hip: Secondary | ICD-10-CM

## 2018-07-01 DIAGNOSIS — E0590 Thyrotoxicosis, unspecified without thyrotoxic crisis or storm: Secondary | ICD-10-CM | POA: Insufficient documentation

## 2018-07-01 DIAGNOSIS — M47816 Spondylosis without myelopathy or radiculopathy, lumbar region: Secondary | ICD-10-CM | POA: Diagnosis not present

## 2018-07-01 MED ORDER — OXYCODONE HCL 5 MG PO TABS: 5.0000 mg | ORAL_TABLET | Freq: Four times a day (QID) | ORAL | 0 refills | Status: DC | PRN

## 2018-07-01 NOTE — Patient Instructions (Signed)
____________________________________________________________________________________________  Medication Rules  Applies to: All patients receiving prescriptions (written or electronic).  Pharmacy of record: Pharmacy where electronic prescriptions will be sent. If written prescriptions are taken to a different pharmacy, please inform the nursing staff. The pharmacy listed in the electronic medical record should be the one where you would like electronic prescriptions to be sent.  Prescription refills: Only during scheduled appointments. Applies to both, written and electronic prescriptions.  NOTE: The following applies primarily to controlled substances (Opioid* Pain Medications).   Patient's responsibilities: 1. Pain Pills: Bring all pain pills to every appointment (except for procedure appointments). 2. Pill Bottles: Bring pills in original pharmacy bottle. Always bring newest bottle. Bring bottle, even if empty. 3. Medication refills: You are responsible for knowing and keeping track of what medications you need refilled. The day before your appointment, write a list of all prescriptions that need to be refilled. Bring that list to your appointment and give it to the admitting nurse. Prescriptions will be written only during appointments. If you forget a medication, it will not be "Called in", "Faxed", or "electronically sent". You will need to get another appointment to get these prescribed. 4. Prescription Accuracy: You are responsible for carefully inspecting your prescriptions before leaving our office. Have the discharge nurse carefully go over each prescription with you, before taking them home. Make sure that your name is accurately spelled, that your address is correct. Check the name and dose of your medication to make sure it is accurate. Check the number of pills, and the written instructions to make sure they are clear and accurate. Make sure that you are given enough medication to last  until your next medication refill appointment. 5. Taking Medication: Take medication as prescribed. Never take more pills than instructed. Never take medication more frequently than prescribed. Taking less pills or less frequently is permitted and encouraged, when it comes to controlled substances (written prescriptions).  6. Inform other Doctors: Always inform, all of your healthcare providers, of all the medications you take. 7. Pain Medication from other Providers: You are not allowed to accept any additional pain medication from any other Doctor or Healthcare provider. There are two exceptions to this rule. (see below) In the event that you require additional pain medication, you are responsible for notifying us, as stated below. 8. Medication Agreement: You are responsible for carefully reading and following our Medication Agreement. This must be signed before receiving any prescriptions from our practice. Safely store a copy of your signed Agreement. Violations to the Agreement will result in no further prescriptions. (Additional copies of our Medication Agreement are available upon request.) 9. Laws, Rules, & Regulations: All patients are expected to follow all Federal and State Laws, Statutes, Rules, & Regulations. Ignorance of the Laws does not constitute a valid excuse. The use of any illegal substances is prohibited. 10. Adopted CDC guidelines & recommendations: Target dosing levels will be at or below 60 MME/day. Use of benzodiazepines** is not recommended.  Exceptions: There are only two exceptions to the rule of not receiving pain medications from other Healthcare Providers. 1. Exception #1 (Emergencies): In the event of an emergency (i.e.: accident requiring emergency care), you are allowed to receive additional pain medication. However, you are responsible for: As soon as you are able, call our office [phone], at any time of the day or night, and leave a message stating your name, the  date and nature of the emergency, and the name and dose of the medication   prescribed. In the event that your call is answered by a member of our staff, make sure to document and save the date, time, and the name of the person that took your information.  2. Exception #2 (Planned Surgery): In the event that you are scheduled by another doctor or dentist to have any type of surgery or procedure, you are allowed (for a period no longer than 30 days), to receive additional pain medication, for the acute post-op pain. However, in this case, you are responsible for picking up a copy of our "Post-op Pain Management for Surgeons" handout, and giving it to your surgeon or dentist. This document is available at our office, and does not require an appointment to obtain it. Simply go to our office during business hours (Monday-Thursday from 8:00 AM to 4:00 PM) (Friday 8:00 AM to 12:00 Noon) or if you have a scheduled appointment with us, prior to your surgery, and ask for it by name. In addition, you will need to provide us with your name, name of your surgeon, type of surgery, and date of procedure or surgery.  *Opioid medications include: morphine, codeine, oxycodone, oxymorphone, hydrocodone, hydromorphone, meperidine, tramadol, tapentadol, buprenorphine, fentanyl, methadone. **Benzodiazepine medications include: diazepam (Valium), alprazolam (Xanax), clonazepam (Klonopine), lorazepam (Ativan), clorazepate (Tranxene), chlordiazepoxide (Librium), estazolam (Prosom), oxazepam (Serax), temazepam (Restoril), triazolam (Halcion) (Last updated: 01/17/2018) ____________________________________________________________________________________________   ____________________________________________________________________________________________  Drug Holidays (Slow)  What is a "Drug Holiday"? Drug Holiday: is the name given to the period of time during which a patient stops taking a medication(s) for the purpose of  eliminating tolerance to the drug.  Benefits . Improved effectiveness of opioids. . Decreased opioid dose needed to achieve benefits. . Improved pain with lesser dose.  What is tolerance? Tolerance: is the progressive decreased in effectiveness of a drug due to its repetitive use. With repetitive use, the body gets use to the medication and as a consequence, it loses its effectiveness. This is a common problem seen with opioid pain medications. As a result, a larger dose of the drug is needed to achieve the same effect that used to be obtained with a smaller dose.  How long should a "Drug Holiday" last? At least 14 consecutive days. (2 weeks)  What are withdrawals? Withdrawals: refers to the wide range of symptoms that occur after stopping or dramatically reducing opiate drugs after heavy and prolonged use. Withdrawal symptoms do not occur to patients that use low dose opioids, or those who take the medication sporadically. Contrary to benzodiazepine (example: Valium, Xanax, etc.) or alcohol withdrawals ("Delirium Tremens"), opioid withdrawals are not lethal. Withdrawals are the physical manifestation of the body getting rid of the excess receptors.  Expected Symptoms Early symptoms of withdrawal may include: . Agitation . Anxiety . Muscle aches . Increased tearing . Insomnia . Runny nose . Sweating . Yawning  Late symptoms of withdrawal may include: . Abdominal cramping . Diarrhea . Dilated pupils . Goose bumps . Nausea . Vomiting  Will I experience withdrawals? Due to the slow nature of the taper, it is very unlikely that you will experience any.  What is a slow taper? Taper: refers to the gradual decrease in dose. ___________________________________________________________________________________________  

## 2018-07-01 NOTE — Progress Notes (Signed)
Nursing Pain Medication Assessment:  Safety precautions to be maintained throughout the outpatient stay will include: orient to surroundings, keep bed in low position, maintain call bell within reach at all times, provide assistance with transfer out of bed and ambulation.  Medication Inspection Compliance: Pill count conducted under aseptic conditions, in front of the patient. Neither the pills nor the bottle was removed from the patient's sight at any time. Once count was completed pills were immediately returned to the patient in their original bottle.  Medication: See above Pill/Patch Count: 0 of 120 pills remain Pill/Patch Appearance: Markings consistent with prescribed medication Bottle Appearance: Standard pharmacy container. Clearly labeled. Filled Date: 7 / 10 / 2019 Last Medication intake:  Ran out of medicine more than 48 hours ago

## 2018-07-01 NOTE — Progress Notes (Signed)
Patient's Name: [name]  MRN: [id number]  Referring Provider: Albina Billet, MD  DOB: September 29, 1946  PCP: Albina Billet, MD  DOS: 07/01/2018  Note by: Vevelyn Francois NP  Service setting: Ambulatory outpatient  Specialty: Interventional Pain Management  Location: [facility] (AMB) Pain Management Facility    Patient type: Established    Primary Reason(s) for Visit: Encounter for prescription drug management. (Level of risk: moderate)  CC: Ankle Pain (left) and Back Pain (mid, lower)  HPI  [name] is a 72 y.o. year old, female patient, who comes today for a medication management evaluation. 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); Cervical facet syndrome (Bilateral) (R>L); Cervical myofascial pain syndrome; Lumbar spondylosis; Chronic shoulder impingement syndrome (Right); Low magnesium levels; CRP elevated; Elevated sedimentation rate; Chronic obstructive pulmonary disease (COPD) ([place]); Nicotine dependence; Chronic shoulder pain (Right); Abnormal nerve conduction studies; Chronic shoulder pain (Bilateral) (status post multiple surgeries) (R>L); Cervical facet hypertrophy (Bilateral); History of shoulder surgery 5 (Right); Lumbar foraminal stenosis (L3-4) (Left); 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 Her primarily concern today is the Ankle Pain (left) and Back Pain (mid, lower)  Pain Assessment: Location: Mid, Lower Back Radiating: denies Onset: More than a month ago Duration: Chronic pain Quality: Aching, Constant, Discomfort Severity: 7 /10 (subjective, self-reported pain score)  Note: Reported level is compatible with observation. Clinically the patient looks like a 2/10 A 2/10 is viewed as "Mild to Moderate" and described as noticeable and distracting. Impossible to hide from other people. More frequent flare-ups. Still possible to adapt and function close to normal. It can be very annoying and may have occasional stronger flare-ups. With discipline, patients may get used to it and adapt. Information on the proper use of the pain scale provided to the patient today. When using our objective Pain Scale, levels between 6 and 10/10 are said to belong in an emergency room, as it progressively worsens from a 6/10, described as severely limiting, requiring emergency care not usually available at an outpatient pain management facility. At a 6/10 level, communication becomes difficult and requires great effort. Assistance to reach the emergency department may be required. Facial flushing and profuse sweating along with potentially dangerous increases in heart rate and blood pressure will be evident. Effect on ADL: prolonged walking, prolonged standing, Modifying factors: medications BP: 134/74  HR: (!) 111  [name] was last scheduled for an appointment on  03/28/2018 for medication management. During today's appointment we reviewed [name] chronic pain status, as well as her outpatient medication regimen. She suffered a fall on today.  She denies any  injuries.  She admits that she has had multiple falls.  She admits that is related to her right hip giving out.  He  admits that she has a cane and walker however does not use them consistently.  She admits that her oxycodone is not effective for her pain.   The patient  reports that she does not use drugs. Her body mass index is 21.46 kg/m.  Further details on both, my assessment(s), as well as the proposed treatment plan, please see below.  Controlled Substance Pharmacotherapy Assessment REMS (Risk Evaluation and Mitigation Strategy)  Analgesic:Oxycodone 24m QID MME/day:356mday.   GaIgnatius SpeckingRN  07/01/2018  1:39 PM  Sign at close encounter Nursing Pain Medication Assessment:  Safety precautions to be maintained throughout the outpatient stay will include: orient to surroundings, keep bed in low position, maintain call bell within reach at all times, provide assistance with transfer out of bed and ambulation.  Medication Inspection Compliance: Pill count conducted under aseptic conditions, in front of the patient. Neither the pills nor the bottle was removed from the patient's sight at any time. Once count was completed pills were immediately returned to the patient in their original bottle.  Medication: See above Pill/Patch Count: 0 of 120 pills remain Pill/Patch Appearance: Markings consistent with prescribed medication Bottle Appearance: Standard pharmacy container. Clearly labeled. Filled Date: 7 / 10 / 2019 Last Medication intake:  Ran out of medicine more than 48 hours ago   Pharmacokinetics: Liberation and absorption (onset of action): Shorter than expected. Distribution (time to peak effect): Shorter than expected. Metabolism and excretion (duration of action): Shorter than expected.         Pharmacodynamics: Desired effects: Analgesia: [name] <50% benefit. Functional ability: Patient reports that medication does help, but not nearly as much as she would  like Clinically meaningful improvement in function (CMIF): Sustained CMIF goals met Perceived effectiveness: Described as ineffective and would like to make some changes Undesirable effects: Side-effects or Adverse reactions: None reported Monitoring: [place] PMP: Online review of the past 1276-monthriod conducted. Compliant with practice rules and regulations Last UDS on record: Summary  Date Value Ref Range Status  03/28/2018 FINAL  Final    Comment:    ==================================================================== TOXASSURE SELECT 13 (MW) ==================================================================== Test                             Result       Flag       Units Drug Present and Declared for Prescription Verification   Alprazolam                     103          EXPECTED   ng/mg creat   Alpha-hydroxyalprazolam        >717         EXPECTED   ng/mg creat    Source of alprazolam is a scheduled prescription medication.    Alpha-hydroxyalprazolam is an expected metabolite of alprazolam.   Oxycodone                      33           EXPECTED   ng/mg creat   Oxymorphone                    34           EXPECTED   ng/mg creat   Noroxycodone                   1035  EXPECTED   ng/mg creat   Noroxymorphone                 39           EXPECTED   ng/mg creat    Sources of oxycodone are scheduled prescription medications.    Oxymorphone, noroxycodone, and noroxymorphone are expected    metabolites of oxycodone. Oxymorphone is also available as a    scheduled prescription medication. ==================================================================== Test                      Result    Flag   Units      Ref Range   Creatinine              279              mg/dL      >=20 ==================================================================== Declared Medications:  The flagging and interpretation on this report are based on the  following declared medications.  Unexpected results  may arise from  inaccuracies in the declared medications.  **Note: The testing scope of this panel includes these medications:  Alprazolam (Xanax)  Oxycodone (Oxy-IR)  Oxycodone (Roxicodone)  **Note: The testing scope of this panel does not include following  reported medications:  Albuterol (Combivent)  Bupropion (Wellbutrin)  Escitalopram (Lexapro)  Fluticasone (Advair)  Ipratropium (Combivent)  Levothyroxine  Magnesium (Mag-Ox)  Salmeterol (Advair)  Tizanidine (Zanaflex)  Trazodone (Desyrel)  Vitamin D3 ==================================================================== For clinical consultation, please call [phone]. ====================================================================    UDS interpretation: Compliant          Medication Assessment Form: Reviewed. Patient indicates being compliant with therapy Treatment compliance: Compliant Risk Assessment Profile: Aberrant behavior: continued use despite claims of ineffective analgesia Comorbid factors increasing risk of overdose: Benzodiazepine use, caucasian, concomitant use of Benzodiazepines, COPD or asthma and nicotine dependence Risk of substance use disorder (SUD): Moderate Opioid Risk Tool - 07/01/18 1337      Personal History of Substance Abuse   Alcohol  Negative    Illegal Drugs  Negative    Rx Drugs  Negative      Psychological Disease   Psychological Disease  Positive    ADD  Negative    OCD  Negative    Bipolar  Negative    Schizophrenia  Negative    Depression  Positive      Total Score   Opioid Risk Tool Scoring  3    Opioid Risk Interpretation  Low Risk      ORT Scoring interpretation table:  Score <3 = Low Risk for SUD  Score between 4-7 = Moderate Risk for SUD  Score >8 = High Risk for Opioid Abuse   Risk Mitigation Strategies:  Patient Counseling: Covered Patient-Prescriber Agreement (PPA): Present and active  Notification to other healthcare providers: Done  Pharmacologic  Plan: No change in therapy, at this time.             Laboratory Chemistry  Inflammation Markers (CRP: Acute Phase) (ESR: Chronic Phase) Lab Results  Component Value Date   CRP 14.3 (H) 06/05/2017   ESRSEDRATE 9 06/05/2017                         Rheumatology Markers Lab Results  Component Value Date   LABURIC 3.7 10/27/2008                        Renal Function Markers Lab  Results  Component Value Date   BUN 14 06/05/2017   CREATININE 0.68 06/05/2017   BCR 21 06/05/2017   GFRAA 102 06/05/2017   GFRNONAA 89 06/05/2017                             Hepatic Function Markers Lab Results  Component Value Date   AST 17 06/05/2017   ALT 14 06/05/2017   ALBUMIN 3.6 06/05/2017   ALKPHOS 124 (H) 06/05/2017                        Electrolytes Lab Results  Component Value Date   NA 144 06/05/2017   K 4.0 06/05/2017   CL 104 06/05/2017   CALCIUM 9.0 06/05/2017   MG 1.7 06/05/2017   PHOS 2.9 10/02/2016                        Neuropathy Markers Lab Results  Component Value Date   VITAMINB12 282 06/05/2017                        Bone Pathology Markers Lab Results  Component Value Date   25OHVITD1 28 (L) 06/05/2017   25OHVITD2 3.0 06/05/2017   25OHVITD3 25 06/05/2017                         Coagulation Parameters Lab Results  Component Value Date   INR 1.08 10/01/2016   LABPROT 14.0 10/01/2016   PLT 405 02/20/2017                        Cardiovascular Markers Lab Results  Component Value Date   CKTOTAL 372 (H) 10/01/2016   TROPONINI <0.03 10/01/2016   HGB 11.5 (L) 02/20/2017   HCT 35.0 02/20/2017                         CA Markers No results found for: CEA, CA125, LABCA2                      Note: Lab results reviewed.  Recent Diagnostic Imaging Results  DG Lumbar Spine Complete W/Bend CLINICAL DATA:  72 year old female fractured hip in November with low back pain since. Initial encounter.  EXAM: LUMBAR SPINE - COMPLETE WITH BENDING  VIEWS  COMPARISON:  02/10/2014.  FINDINGS: New from the 2015 lumbar spine plain film examination are T11 and T12 superior endplate compression fractures with 25-30% loss height. There is also a new L5 superior endplate compression fracture with 20% loss of height. Please see MR lumbar spine report performed same date and dictated separately.  Facet degenerative changes lumbar spine with minimal anterior slip L3. No abnormal motion between flexion and extension.  No pars defect.  Mild curvature lower thoracic lumbar spine convex right.  Vascular calcifications.  IMPRESSION: New from the 2015 lumbar spine plain film examination are T11 and T12 superior endplate compression fractures with 25-30% loss height. There is also a new L5 superior endplate compression fracture with 20% loss of height. Please see MR lumbar spine report performed same date and dictated separately.  Facet degenerative changes lumbar spine with minimal anterior slip L3.  No abnormal motion between flexion and extension.  Mild curvature lower thoracic lumbar spine convex right.  Aortic Atherosclerosis (ICD10-I70.0).  Electronically Signed  By: Genia Del M.D.   On: 06/18/2017 20:04 MR LUMBAR SPINE W WO CONTRAST CLINICAL DATA:  Low back pain radiating into the right leg.  EXAM: MRI LUMBAR SPINE WITHOUT AND WITH CONTRAST  TECHNIQUE: Multiplanar and multiecho pulse sequences of the lumbar spine were obtained without and with intravenous contrast.  CONTRAST:  10m MULTIHANCE GADOBENATE DIMEGLUMINE 529 MG/ML IV SOLN  COMPARISON:  Lumbar spine x-rays dated February 10, 2014. MRI lumbar spine report dated December 15, 2010.  FINDINGS: Segmentation:  Standard.  Alignment:  Trace anterolisthesis of L3 on L4, unchanged.  Vertebrae: Mild increased edema and enhancement within the superior endplate of TQ76 consistent with subacute compression fracture. There is approximately 25% height loss. No  retropulsion. Chronic central superior endplate deformity of the T12 vertebral body. No evidence of discitis or focal bone lesion.  Conus medullaris: Extends to the L1 level and appears normal. No abnormal enhancement identified.  Paraspinal and other soft tissues: Bilateral renal cysts. Otherwise negative.  Disc levels:  T12-L1:  Normal.  L1-L2:  Normal.  L2-L3: Small broad-based disc bulge without spinal canal or neuroforaminal stenosis.  L3-L4: Small posterior disc bulge and bilateral facet arthropathy resulting in mild central spinal canal stenosis and moderate left neuroforaminal stenosis, unchanged. No right neuroforaminal stenosis.  L4-L5: Diffuse disc bulge and bilateral facet arthropathy resulting in moderate central spinal canal stenosis, mild narrowing of the bilateral lateral recesses, and moderate left and mild right neuroforaminal stenosis.  L5-S1: Small diffuse disc bulge and mild bilateral facet arthropathy without spinal canal or neuroforaminal stenosis.  IMPRESSION: 1. Subacute compression deformity of the T11 vertebral body with approximately 25% height loss. 2. Central superior endplate compression deformity of the T12 vertebral body, new from prior studies, but chronic in appearance. 3. Worsened degenerative disc disease at L4-L5 where there is now moderate central spinal canal stenosis and moderate left neuroforaminal stenosis. 4. Unchanged mild central spinal canal stenosis and moderate left neuroforaminal stenosis at L3-L4.  Electronically Signed   By: WTitus DubinM.D.   On: 06/18/2017 17:30  Complexity Note: Imaging results reviewed. Results shared with [name] using Layman's terms.                         Meds   Current Outpatient Medications:  .  ALPRAZolam (XANAX) 1 MG tablet, Take 1 tablet (1 mg total) by mouth 4 (four) times daily as needed. (Patient taking differently: Take 1 mg by mouth 2 (two) times daily as needed. ), Disp:  30 tablet, Rfl: 0 .  buPROPion (WELLBUTRIN SR) 150 MG 12 hr tablet, Take 150 mg by mouth daily. , Disp: , Rfl:  .  Cholecalciferol (VITAMIN D3) 5000 units TABS, Take 1 tablet (5,000 Units total) by mouth daily., Disp: 30 tablet, Rfl: 2 .  escitalopram (LEXAPRO) 10 MG tablet, Take 10 mg by mouth daily. , Disp: , Rfl:  .  Fluticasone-Salmeterol (ADVAIR) 100-50 MCG/DOSE AEPB, Inhale 1 puff into the lungs every 12 (twelve) hours., Disp: , Rfl:  .  hydrochlorothiazide (MICROZIDE) 12.5 MG capsule, Take 12.5 mg by mouth daily., Disp: , Rfl:  .  Ipratropium-Albuterol (COMBIVENT RESPIMAT) 20-100 MCG/ACT AERS respimat, Inhale 1 puff into the lungs every 6 (six) hours as needed for wheezing or shortness of breath., Disp: , Rfl:  .  levothyroxine (SYNTHROID, LEVOTHROID) 88 MCG tablet, Take 88 mcg by mouth daily before breakfast. , Disp: , Rfl:  .  traZODone (DESYREL) 150 MG tablet, Take 300 mg  by mouth at bedtime. , Disp: , Rfl:  .  magnesium oxide (MAG-OX) 400 MG tablet, Take 1 tablet (400 mg total) by mouth 2 (two) times daily., Disp: 180 tablet, Rfl: 0 .  [START ON 08/30/2018] oxyCODONE (OXY IR/ROXICODONE) 5 MG immediate release tablet, Take 1 tablet (5 mg total) by mouth every 6 (six) hours as needed for severe pain., Disp: 120 tablet, Rfl: 0 .  [START ON 07/31/2018] oxyCODONE (OXY IR/ROXICODONE) 5 MG immediate release tablet, Take 1 tablet (5 mg total) by mouth every 6 (six) hours as needed for severe pain., Disp: 120 tablet, Rfl: 0 .  oxyCODONE (OXY IR/ROXICODONE) 5 MG immediate release tablet, Take 1 tablet (5 mg total) by mouth every 6 (six) hours as needed for severe pain., Disp: 120 tablet, Rfl: 0 .  tizanidine (ZANAFLEX) 2 MG capsule, Take 1 capsule (2 mg total) by mouth 3 (three) times daily as needed for muscle spasms., Disp: 90 capsule, Rfl: 2  ROS  Constitutional: Denies any fever or chills Gastrointestinal: No reported hemesis, hematochezia, vomiting, or acute GI distress Musculoskeletal: Denies  any acute onset joint swelling, redness, loss of ROM, or weakness Neurological: No reported episodes of acute onset apraxia, aphasia, dysarthria, agnosia, amnesia, paralysis, loss of coordination, or loss of consciousness  Allergies  [name] has No Known Allergies.  [place]  Drug: [name]  reports that she does not use drugs. Alcohol:  reports that she does not drink alcohol. Tobacco:  reports that she has been smoking cigarettes. She has been smoking about 0.50 packs per day. She has never used smokeless tobacco. Medical:  has a past medical history of Anxiety, CAD (coronary artery disease), Carpal tunnel syndrome, Cataract, Complication of anesthesia, COPD (chronic obstructive pulmonary disease) ([place]), CRP elevated (09/14/2015), Depression, Difficult intubation, Dyslipidemia, Dyspnea, Dysrhythmia, Elevated sedimentation rate (09/14/2015), Esophageal spasm, Gastrointestinal parasites, GERD (gastroesophageal reflux disease), Hiatal hernia, History of peptic ulcer disease, Hyperthyroidism, Hypothyroidism, Low magnesium levels (09/14/2015), Pelvic fracture ([place]) (2008), Reflux, Rotator cuff injury, and Stenosis, spinal, lumbar. Surgical: [name]  has a past surgical history that includes Cesarean section; Neck surgery; Cholecystectomy; Carpal tunnel release; Rotator cuff repair; Shoulder arthroscopy (12/07/2011); Shoulder surgery (12/07/2011); Intramedullary (im) nail intertrochanteric (Right, 10/01/2016); Fracture surgery; Appendectomy; Nose surgery; Back surgery; Cataract extraction w/PHACO (Right, 08/07/2017); Hip surgery; and Cataract extraction w/PHACO (Left, 09/04/2017). Family: family history includes Aneurysm in her unknown relative.  Constitutional Exam  General appearance: Well nourished, well developed, and well hydrated. In no apparent acute distress Vitals:   07/01/18 1326  BP: 134/74  Pulse: (!) 111  Resp: 16  Temp: 98.2 F (36.8 C)  SpO2: 97%  Weight: 125 lb (56.7 kg)  Height: 5'  4" (1.626 m)   BMI Assessment: Estimated body mass index is 21.46 kg/m as calculated from the following:   Height as of this encounter: 5' 4"  (1.626 m).   Weight as of this encounter: 125 lb (56.7 kg). Psych/Mental status: Alert, oriented x 3 (person, place, & time)       Eyes: PERLA Respiratory: No evidence of acute respiratory distress  Lumbar Spine Area Exam  Skin & Axial Inspection: No masses, redness, or swelling Alignment: Symmetrical Functional ROM: Unrestricted ROM       Stability: No instability detected Muscle Tone/Strength: Functionally intact. No obvious neuro-muscular anomalies detected. Sensory (Neurological): Unimpaired Palpation: No palpable anomalies       Provocative Tests: Hyperextension/rotation test: deferred today       Lumbar quadrant test (Kemp's test): deferred today  Lateral bending test: deferred today       Patrick's Maneuver: deferred today                   FABER test: deferred today                   S-I anterior distraction/compression test: deferred today         S-I lateral compression test: deferred today         S-I Thigh-thrust test: deferred today         S-I Gaenslen's test: deferred today          Gait & Posture Assessment  Ambulation: Unassisted Gait: Relatively normal for age and body habitus Posture: WNL   Lower Extremity Exam    Side: Right lower extremity  Side: Left lower extremity  Stability: No instability observed          Stability: No instability observed          Skin & Extremity Inspection: Evidence of prior arthroplastic surgery  Skin & Extremity Inspection: Skin color, temperature, and hair growth are WNL. No peripheral edema or cyanosis. No masses, redness, swelling, asymmetry, or associated skin lesions. No contractures.  Functional ROM: Unrestricted ROM                  Functional ROM: Unrestricted ROM                  Muscle Tone/Strength: Mild-to-moderate deconditioning  Muscle Tone/Strength: Mild-to-moderate  deconditioning  Sensory (Neurological): Unimpaired  Sensory (Neurological): Unimpaired  Palpation: No palpable anomalies  Palpation: No palpable anomalies   Assessment  Primary Diagnosis & Pertinent Problem List: The primary encounter diagnosis was Lumbar spondylosis. Diagnoses of Chronic pain of both hips, Chronic sacroiliac joint pain (Right), Chronic pain syndrome, and Low magnesium levels were also pertinent to this visit.  Status Diagnosis  Persistent Persistent Persistent 1. Lumbar spondylosis   2. Chronic pain of both hips   3. Chronic sacroiliac joint pain (Right)   4. Chronic pain syndrome   5. Low magnesium levels     Problems updated and reviewed during this visit: No problems updated. Plan of Care  Pharmacotherapy (Medications Ordered): Meds ordered this encounter  Medications  . oxyCODONE (OXY IR/ROXICODONE) 5 MG immediate release tablet    Sig: Take 1 tablet (5 mg total) by mouth every 6 (six) hours as needed for severe pain.    Dispense:  120 tablet    Refill:  0    Do not place this medication on "Automatic Refill". Patient may have prescription filled one day early if pharmacy is closed on scheduled refill date. Do not fill until:10/11/2019To last until: 09/29/2018    Order Specific Question:   Supervising Provider    Answer:   Milinda Pointer [phone]  . oxyCODONE (OXY IR/ROXICODONE) 5 MG immediate release tablet    Sig: Take 1 tablet (5 mg total) by mouth every 6 (six) hours as needed for severe pain.    Dispense:  120 tablet    Refill:  0    Do not place this medication on "Automatic Refill". Patient may have prescription filled one day early if pharmacy is closed on scheduled refill date. Do not fill until: 07/31/2018 To last until:08/30/2018    Order Specific Question:   Supervising Provider    Answer:   Milinda Pointer [phone]  . oxyCODONE (OXY IR/ROXICODONE) 5 MG immediate release tablet    Sig: Take 1 tablet (5  mg total) by mouth every 6 (six)  hours as needed for severe pain.    Dispense:  120 tablet    Refill:  0    Do not place this medication on "Automatic Refill". Patient may have prescription filled one day early if pharmacy is closed on scheduled refill date. Do not fill until:07/01/2018 To last until:07/31/2018    Order Specific Question:   Supervising Provider    Answer:   Milinda Pointer [[id number]]   New Prescriptions   No medications on file   Medications administered today: [name] had no medications administered during this visit. Lab-work, procedure(s), and/or referral(s): No orders of the defined types were placed in this encounter.  Imaging and/or referral(s): None  Interventional management options: Planned, scheduled, and/or pending: Not at this time.  Multiple falls encouraged patient to do physical therapy for balance strengthening and safety.  Patient declined   Considering: Diagnostic right-sidedlumbarfacet + right-sided sacroiliac joint block#2 Diagnostic right-sided sacroiliac joint block  Possible right sided sacroiliac joint RFA Diagnostic right-sided lumbar facet block  Possible right-sided lumbar facet RFA Diagnostic bilateral cervical facetblock  Possible bilateral cervical facet radiofrequencyablation  Diagnostic right-sided cervical epidural steroid injection  Diagnostic bilateral intra-articular shoulderjoint injection  Diagnostic bilateral suprascapularnerve block  Possible bilateral suprascapular nerve radiofrequencyablation  Diagnostic left-sided L3-4 transforaminal epiduralsteroid injection  Diagnostic L3-4 versus L4-5 lumbar epiduralsteroid injection    Palliative PRN treatment(s): Palliative right-sided sacroiliac joint block Palliative right-sided lumbar facet block    Provider-requested follow-up: Return in about 3 months (around 10/01/2018) for MedMgmt with Me Donella Stade Edison Pace).  Future Appointments  Date Time Provider [place]  09/30/2018  12:45 PM Vevelyn Francois, NP [facility] None   Primary Care Physician: Albina Billet, MD Location: [facility] Note by: Vevelyn Francois NP Date: 07/01/2018; Time: 4:12 PM  Pain Score Disclaimer: We use the NRS-11 scale. This is a self-reported, subjective measurement of pain severity with only modest accuracy. It is used primarily to identify changes within a particular patient. It must be understood that outpatient pain scales are significantly less accurate that those used for research, where they can be applied under ideal controlled circumstances with minimal exposure to variables. In reality, the score is likely to be a combination of pain intensity and pain affect, where pain affect describes the degree of emotional arousal or changes in action readiness caused by the sensory experience of pain. Factors such as social and work situation, setting, emotional state, anxiety levels, expectation, and prior pain experience may influence pain perception and show large inter-individual differences that may also be affected by time variables.  Patient instructions provided during this appointment: Patient Instructions  ____________________________________________________________________________________________  Medication Rules  Applies to: All patients receiving prescriptions (written or electronic).  Pharmacy of record: Pharmacy where electronic prescriptions will be sent. If written prescriptions are taken to a different pharmacy, please inform the nursing staff. The pharmacy listed in the electronic medical record should be the one where you would like electronic prescriptions to be sent.  Prescription refills: Only during scheduled appointments. Applies to both, written and electronic prescriptions.  NOTE: The following applies primarily to controlled substances (Opioid* Pain Medications).   Patient's responsibilities: 1. Pain Pills: Bring all pain pills to every  appointment (except for procedure appointments). 2. Pill Bottles: Bring pills in original pharmacy bottle. Always bring newest bottle. Bring bottle, even if empty. 3. Medication refills: You are responsible for knowing and keeping track of what medications you need  refilled. The day before your appointment, write a list of all prescriptions that need to be refilled. Bring that list to your appointment and give it to the admitting nurse. Prescriptions will be written only during appointments. If you forget a medication, it will not be "Called in", "Faxed", or "electronically sent". You will need to get another appointment to get these prescribed. 4. Prescription Accuracy: You are responsible for carefully inspecting your prescriptions before leaving our office. Have the discharge nurse carefully go over each prescription with you, before taking them home. Make sure that your name is accurately spelled, that your address is correct. Check the name and dose of your medication to make sure it is accurate. Check the number of pills, and the written instructions to make sure they are clear and accurate. Make sure that you are given enough medication to last until your next medication refill appointment. 5. Taking Medication: Take medication as prescribed. Never take more pills than instructed. Never take medication more frequently than prescribed. Taking less pills or less frequently is permitted and encouraged, when it comes to controlled substances (written prescriptions).  6. Inform other Doctors: Always inform, all of your healthcare providers, of all the medications you take. 7. Pain Medication from other Providers: You are not allowed to accept any additional pain medication from any other Doctor or Healthcare provider. There are two exceptions to this rule. (see below) In the event that you require additional pain medication, you are responsible for notifying us, as stated below. 8. Medication Agreement: You  are responsible for carefully reading and following our Medication Agreement. This must be signed before receiving any prescriptions from our practice. Safely store a copy of your signed Agreement. Violations to the Agreement will result in no further prescriptions. (Additional copies of our Medication Agreement are available upon request.) 9. Laws, Rules, & Regulations: All patients are expected to follow all Federal and [employer], [employer], Rules, [employer]. Ignorance of the Laws does not constitute a valid excuse. The use of any illegal substances is prohibited. 10. Adopted CDC guidelines & recommendations: Target dosing levels will be at or below 60 MME/day. Use of benzodiazepines** is not recommended.  Exceptions: There are only two exceptions to the rule of not receiving pain medications from other Healthcare Providers. 1. Exception #1 (Emergencies): In the event of an emergency (i.e.: accident requiring emergency care), you are allowed to receive additional pain medication. However, you are responsible for: As soon as you are able, call our office [phone], at any time of the day or night, and leave a message stating your name, the date and nature of the emergency, and the name and dose of the medication prescribed. In the event that your call is answered by a member of our staff, make sure to document and save the date, time, and the name of the person that took your information.  2. Exception #2 (Planned Surgery): In the event that you are scheduled by another doctor or dentist to have any type of surgery or procedure, you are allowed (for a period no longer than 30 days), to receive additional pain medication, for the acute post-op pain. However, in this case, you are responsible for picking up a copy of our "Post-op Pain Management for Surgeons" handout, and giving it to your surgeon or dentist. This document is available at our office, and does not require an appointment to obtain it.  Simply go to our office during business hours (Monday-Thursday from 8:00 AM to  4:00 PM) (Friday 8:00 AM to 12:00 Noon) or if you have a scheduled appointment with [place], prior to your surgery, and ask for it by name. In addition, you will need to provide [place] with your name, name of your surgeon, type of surgery, and date of procedure or surgery.  *Opioid medications include: morphine, codeine, oxycodone, oxymorphone, hydrocodone, hydromorphone, meperidine, tramadol, tapentadol, buprenorphine, fentanyl, methadone. **Benzodiazepine medications include: diazepam (Valium), alprazolam (Xanax), clonazepam (Klonopine), lorazepam (Ativan), clorazepate (Tranxene), chlordiazepoxide (Librium), estazolam (Prosom), oxazepam (Serax), temazepam (Restoril), triazolam (Halcion) (Last updated: 01/17/2018) ____________________________________________________________________________________________   ____________________________________________________________________________________________  Drug Holidays (Slow)  What is a "Drug Holiday"? Drug Holiday: is the name given to the period of time during which a patient stops taking a medication(s) for the purpose of eliminating tolerance to the drug.  Benefits . Improved effectiveness of opioids. . Decreased opioid dose needed to achieve benefits. . Improved pain with lesser dose.  What is tolerance? Tolerance: is the progressive decreased in effectiveness of a drug due to its repetitive use. With repetitive use, the body gets use to the medication and as a consequence, it loses its effectiveness. This is a common problem seen with opioid pain medications. As a result, a larger dose of the drug is needed to achieve the same effect that used to be obtained with a smaller dose.  How long should a "Drug Holiday" last? At least 14 consecutive days. (2 weeks)  What are withdrawals? Withdrawals: refers to the wide range of symptoms that occur after stopping or dramatically  reducing opiate drugs after heavy and prolonged use. Withdrawal symptoms do not occur to patients that use low dose opioids, or those who take the medication sporadically. Contrary to benzodiazepine (example: Valium, Xanax, etc.) or alcohol withdrawals ("Delirium Tremens"), opioid withdrawals are not lethal. Withdrawals are the physical manifestation of the body getting rid of the excess receptors.  Expected Symptoms Early symptoms of withdrawal may include: . Agitation . Anxiety . Muscle aches . Increased tearing . Insomnia . Runny nose . Sweating . Yawning  Late symptoms of withdrawal may include: . Abdominal cramping . Diarrhea . Dilated pupils . Goose bumps . Nausea . Vomiting  Will I experience withdrawals? Due to the slow nature of the taper, it is very unlikely that you will experience any.  What is a slow taper? Taper: refers to the gradual decrease in dose. ___________________________________________________________________________________________

## 2018-07-25 DIAGNOSIS — D485 Neoplasm of uncertain behavior of skin: Secondary | ICD-10-CM | POA: Diagnosis not present

## 2018-07-25 DIAGNOSIS — D225 Melanocytic nevi of trunk: Secondary | ICD-10-CM | POA: Diagnosis not present

## 2018-08-17 ENCOUNTER — Emergency Department: Payer: Medicare HMO

## 2018-08-17 ENCOUNTER — Other Ambulatory Visit: Payer: Self-pay

## 2018-08-17 ENCOUNTER — Encounter: Payer: Self-pay | Admitting: *Deleted

## 2018-08-17 ENCOUNTER — Inpatient Hospital Stay
Admission: EM | Admit: 2018-08-17 | Discharge: 2018-08-21 | DRG: 478 | Disposition: A | Discharge status: Home Health | Payer: Medicare HMO | Attending: Internal Medicine | Admitting: Internal Medicine

## 2018-08-17 DIAGNOSIS — Z8711 Personal history of peptic ulcer disease: Secondary | ICD-10-CM

## 2018-08-17 DIAGNOSIS — K449 Diaphragmatic hernia without obstruction or gangrene: Secondary | ICD-10-CM | POA: Diagnosis present

## 2018-08-17 DIAGNOSIS — S32030A Wedge compression fracture of third lumbar vertebra, initial encounter for closed fracture: Secondary | ICD-10-CM | POA: Diagnosis not present

## 2018-08-17 DIAGNOSIS — Z981 Arthrodesis status: Secondary | ICD-10-CM | POA: Diagnosis not present

## 2018-08-17 DIAGNOSIS — F419 Anxiety disorder, unspecified: Secondary | ICD-10-CM | POA: Diagnosis present

## 2018-08-17 DIAGNOSIS — F329 Major depressive disorder, single episode, unspecified: Secondary | ICD-10-CM | POA: Diagnosis present

## 2018-08-17 DIAGNOSIS — N3001 Acute cystitis with hematuria: Secondary | ICD-10-CM | POA: Diagnosis present

## 2018-08-17 DIAGNOSIS — Z9842 Cataract extraction status, left eye: Secondary | ICD-10-CM

## 2018-08-17 DIAGNOSIS — I10 Essential (primary) hypertension: Secondary | ICD-10-CM | POA: Diagnosis present

## 2018-08-17 DIAGNOSIS — E782 Mixed hyperlipidemia: Secondary | ICD-10-CM | POA: Diagnosis present

## 2018-08-17 DIAGNOSIS — E876 Hypokalemia: Secondary | ICD-10-CM | POA: Diagnosis present

## 2018-08-17 DIAGNOSIS — E860 Dehydration: Secondary | ICD-10-CM | POA: Diagnosis not present

## 2018-08-17 DIAGNOSIS — Z716 Tobacco abuse counseling: Secondary | ICD-10-CM

## 2018-08-17 DIAGNOSIS — F17210 Nicotine dependence, cigarettes, uncomplicated: Secondary | ICD-10-CM | POA: Diagnosis present

## 2018-08-17 DIAGNOSIS — M48061 Spinal stenosis, lumbar region without neurogenic claudication: Secondary | ICD-10-CM | POA: Diagnosis present

## 2018-08-17 DIAGNOSIS — M79662 Pain in left lower leg: Secondary | ICD-10-CM | POA: Diagnosis not present

## 2018-08-17 DIAGNOSIS — Z961 Presence of intraocular lens: Secondary | ICD-10-CM | POA: Diagnosis present

## 2018-08-17 DIAGNOSIS — S0990XA Unspecified injury of head, initial encounter: Secondary | ICD-10-CM | POA: Diagnosis not present

## 2018-08-17 DIAGNOSIS — I2510 Atherosclerotic heart disease of native coronary artery without angina pectoris: Secondary | ICD-10-CM | POA: Diagnosis present

## 2018-08-17 DIAGNOSIS — J449 Chronic obstructive pulmonary disease, unspecified: Secondary | ICD-10-CM | POA: Diagnosis present

## 2018-08-17 DIAGNOSIS — E039 Hypothyroidism, unspecified: Secondary | ICD-10-CM | POA: Diagnosis present

## 2018-08-17 DIAGNOSIS — Z79899 Other long term (current) drug therapy: Secondary | ICD-10-CM

## 2018-08-17 DIAGNOSIS — N390 Urinary tract infection, site not specified: Secondary | ICD-10-CM | POA: Diagnosis present

## 2018-08-17 DIAGNOSIS — Z79890 Hormone replacement therapy (postmenopausal): Secondary | ICD-10-CM

## 2018-08-17 DIAGNOSIS — S8992XA Unspecified injury of left lower leg, initial encounter: Secondary | ICD-10-CM | POA: Diagnosis not present

## 2018-08-17 DIAGNOSIS — W010XXA Fall on same level from slipping, tripping and stumbling without subsequent striking against object, initial encounter: Secondary | ICD-10-CM | POA: Diagnosis present

## 2018-08-17 DIAGNOSIS — Z419 Encounter for procedure for purposes other than remedying health state, unspecified: Secondary | ICD-10-CM

## 2018-08-17 DIAGNOSIS — R296 Repeated falls: Secondary | ICD-10-CM | POA: Diagnosis not present

## 2018-08-17 DIAGNOSIS — K219 Gastro-esophageal reflux disease without esophagitis: Secondary | ICD-10-CM | POA: Diagnosis present

## 2018-08-17 DIAGNOSIS — Z9049 Acquired absence of other specified parts of digestive tract: Secondary | ICD-10-CM

## 2018-08-17 DIAGNOSIS — E785 Hyperlipidemia, unspecified: Secondary | ICD-10-CM | POA: Diagnosis present

## 2018-08-17 DIAGNOSIS — R0600 Dyspnea, unspecified: Secondary | ICD-10-CM | POA: Diagnosis not present

## 2018-08-17 DIAGNOSIS — Z9841 Cataract extraction status, right eye: Secondary | ICD-10-CM

## 2018-08-17 DIAGNOSIS — G894 Chronic pain syndrome: Secondary | ICD-10-CM | POA: Diagnosis present

## 2018-08-17 DIAGNOSIS — R2681 Unsteadiness on feet: Secondary | ICD-10-CM | POA: Diagnosis present

## 2018-08-17 DIAGNOSIS — R0602 Shortness of breath: Secondary | ICD-10-CM | POA: Diagnosis not present

## 2018-08-17 DIAGNOSIS — R0902 Hypoxemia: Secondary | ICD-10-CM | POA: Diagnosis not present

## 2018-08-17 LAB — CBC WITH DIFFERENTIAL/PLATELET
Basophils Absolute: 0.1 10*3/uL (ref 0–0.1)
Basophils Relative: 1 %
EOS ABS: 0.2 10*3/uL (ref 0–0.7)
Eosinophils Relative: 2 %
HEMATOCRIT: 38.8 % (ref 35.0–47.0)
HEMOGLOBIN: 13.4 g/dL (ref 12.0–16.0)
LYMPHS ABS: 2.7 10*3/uL (ref 1.0–3.6)
LYMPHS PCT: 31 %
MCH: 31.7 pg (ref 26.0–34.0)
MCHC: 34.5 g/dL (ref 32.0–36.0)
MCV: 91.9 fL (ref 80.0–100.0)
MONOS PCT: 6 %
Monocytes Absolute: 0.5 10*3/uL (ref 0.2–0.9)
NEUTROS ABS: 5.3 10*3/uL (ref 1.4–6.5)
Neutrophils Relative %: 60 %
Platelets: 290 10*3/uL (ref 150–440)
RBC: 4.22 MIL/uL (ref 3.80–5.20)
RDW: 15.2 % — ABNORMAL HIGH (ref 11.5–14.5)
WBC: 8.8 10*3/uL (ref 3.6–11.0)

## 2018-08-17 LAB — URINALYSIS, COMPLETE (UACMP) WITH MICROSCOPIC
GLUCOSE, UA: NEGATIVE mg/dL
HGB URINE DIPSTICK: NEGATIVE
Ketones, ur: NEGATIVE mg/dL
NITRITE: NEGATIVE
PH: 5 (ref 5.0–8.0)
Protein, ur: 30 mg/dL — AB
SPECIFIC GRAVITY, URINE: 1.033 — AB (ref 1.005–1.030)
WBC, UA: >50 WBC/hpf — ABNORMAL HIGH (ref 0–5)

## 2018-08-17 LAB — COMPREHENSIVE METABOLIC PANEL
ALT: 25 U/L (ref 0–44)
AST: 18 U/L (ref 15–41)
Albumin: 2.9 g/dL — ABNORMAL LOW (ref 3.5–5.0)
Alkaline Phosphatase: 139 U/L — ABNORMAL HIGH (ref 38–126)
Anion gap: 7 (ref 5–15)
BUN: 8 mg/dL (ref 8–23)
CHLORIDE: 109 mmol/L (ref 98–111)
CO2: 23 mmol/L (ref 22–32)
CREATININE: 0.7 mg/dL (ref 0.44–1.00)
Calcium: 7.6 mg/dL — ABNORMAL LOW (ref 8.9–10.3)
GFR calc Af Amer: >60 mL/min (ref >60)
GFR calc non Af Amer: >60 mL/min (ref >60)
Glucose, Bld: 94 mg/dL (ref 70–99)
Potassium: 3.2 mmol/L — ABNORMAL LOW (ref 3.5–5.1)
SODIUM: 139 mmol/L (ref 135–145)
Total Bilirubin: 0.4 mg/dL (ref 0.3–1.2)
Total Protein: 5.4 g/dL — ABNORMAL LOW (ref 6.5–8.1)

## 2018-08-17 LAB — TROPONIN I: Troponin I: <0.03 ng/mL (ref <0.03)

## 2018-08-17 LAB — BRAIN NATRIURETIC PEPTIDE: B Natriuretic Peptide: 98 pg/mL (ref 0.0–100.0)

## 2018-08-17 MED ORDER — ACETAMINOPHEN 500 MG PO TABS
1000.0000 mg | ORAL_TABLET | Freq: Once | ORAL | Status: AC
  Administered 2018-08-17: 1000 mg via ORAL
  Filled 2018-08-17: qty 2

## 2018-08-17 MED ORDER — SODIUM CHLORIDE 0.9 % IV SOLN
1.0000 g | Freq: Once | INTRAVENOUS | Status: AC
  Administered 2018-08-17: 1 g via INTRAVENOUS
  Filled 2018-08-17: qty 10

## 2018-08-17 NOTE — ED Notes (Signed)
Wound on right upper arm is approximately 3cm x3cm full-thickness skin tear with thick, serous drainage. Old bandage had to soaked off. Excess skin from tear is distal to wound. Wound was cleaned with NS, xeroform and abd pad was applied and area was wrapped with kling. Patient tolerated procedure well. Patient's neighbor states wound occurred 5 days ago.

## 2018-08-17 NOTE — ED Notes (Signed)
Pt given crackers and beverage.

## 2018-08-17 NOTE — ED Notes (Signed)
No shortness of breath noted at this time. Patient repeatedly said she wants to go home.

## 2018-08-17 NOTE — ED Triage Notes (Addendum)
Per EMS report, Patient reports feeling short of breath beginning last night finished her rescue inhaler. Patient arrived with aerosol mask in place. Patient was given two Duo neb treatments en route. Patient reports numerous falls, has bruising across forehead and a covered wound on right upper arm. Dressing is stuck to the wound. Patient is alert and oriented upon arrival. Patient lives alone. Patient c/o lumbar pain.

## 2018-08-17 NOTE — ED Provider Notes (Signed)
[facility] Emergency Department Provider Note  ____________________________________________   I have reviewed the triage vital signs and the nursing notes.   HISTORY  Chief Complaint Pain after a fall History limited by: Not Limited   HPI [name] is a 72 y.o. female who presents to the emergency department today with primary concern for pain after a fall. It appears the fall happened a couple of days. The patient slipped on a hardwood floor. She states that since then she has been having pain in her back.  She is also complaining of some discomfort in her head.  In addition she has some complaints of shortness of breath.  She states she does have a history of COPD.  She states that the shortness of breath started yesterday.  She denies any associated chest pain.  Denies any fevers.  Per medical record review patient has a history of COPD, CAD.  Past Medical History:  Diagnosis Date  . Anxiety   . CAD (coronary artery disease)   . Carpal tunnel syndrome   . Cataract   . Complication of anesthesia    has woken up at times  . COPD (chronic obstructive pulmonary disease) ([place])   . CRP elevated 09/14/2015  . Depression   . Difficult intubation   . Dyslipidemia   . Dyspnea    DOE  . Dysrhythmia    hx palpatations  . Elevated sedimentation rate 09/14/2015  . Esophageal spasm   . Gastrointestinal parasites   . GERD (gastroesophageal reflux disease)   . Hiatal hernia   . History of peptic ulcer disease   . Hyperthyroidism   . Hypothyroidism   . Low magnesium levels 09/14/2015  . Pelvic fracture ([place]) 2008   fall from riding a horse  . Reflux   . Rotator cuff injury   . Stenosis, spinal, lumbar     Patient Active Problem List   Diagnosis Date Noted  . Chronic pain of both hips 12/31/2017  . Neurogenic pain 08/29/2017  . Chronic low back pain (Primary Area of Pain) (Right) 08/29/2017  . Chronic sacroiliac joint pain (Right) 06/05/2017  .  Vitamin D insufficiency 03/12/2017  . Right hip pain 02/20/2017  . Intertrochanteric fracture of right hip, sequela 02/20/2017  . B12 deficiency 02/05/2017  . Pressure injury of skin 10/02/2016  . Closed displaced intertrochanteric fracture of right femur ([place]) 10/02/2016  . Hip fracture ([place]) 10/01/2016  . Cervical facet hypertrophy (Bilateral) 05/27/2016  . History of shoulder surgery 5 (Right) 05/27/2016  . Lumbar foraminal stenosis (L3-4) (Left) 05/27/2016  . 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 Lumbar facet hypertrophy (Bilateral) 05/27/2016  . Lumbar facet syndrome (Right) 05/27/2016  . Lumbar grade 1 Anterolisthesis of L3 over L4 05/27/2016  . Chronic shoulder pain (Bilateral) (status post multiple surgeries) (R>L) 12/08/2015  . Substance use disorder Risk: High 09/14/2015  . Chronic pain syndrome 09/14/2015  . Cervical spondylosis 09/14/2015  . Chronic neck pain (Secondary source of pain) (Bilateral) (R>L) 09/14/2015  . 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 Cervical facet syndrome (Bilateral) (R>L) 09/14/2015  . Cervical myofascial pain syndrome 09/14/2015  . Lumbar spondylosis 09/14/2015  . Chronic shoulder impingement syndrome (Right) 09/14/2015  . Low magnesium levels 09/14/2015  . CRP elevated 09/14/2015  . Elevated sedimentation rate 09/14/2015  . Chronic obstructive pulmonary disease (COPD) ([place]) 09/14/2015  . Nicotine dependence 09/14/2015  . Chronic shoulder pain (Right) 09/14/2015  . Abnormal  nerve conduction studies 09/14/2015  . Encounter for therapeutic drug level monitoring 09/09/2015  . Long term current use of opiate analgesic 09/09/2015  . Long term prescription opiate use 09/09/2015  . Uncomplicated opioid dependence ([place]) 09/09/2015  . Opiate use 09/09/2015  . Anxiety and depression 07/11/2012  . Smoking 05/27/2011  . Fatigue 05/27/2011  . HYPOTHYROIDISM 11/25/2010  .  HYPERLIPIDEMIA-MIXED 08/24/2010  . Tachycardia 08/24/2010  . DYSPNEA 08/24/2010    Past Surgical History:  Procedure Laterality Date  . APPENDECTOMY    . BACK SURGERY     CERVICAL FUSION  . CARPAL TUNNEL RELEASE    . CATARACT EXTRACTION W/PHACO Right 08/07/2017   Procedure: CATARACT EXTRACTION PHACO AND INTRAOCULAR LENS PLACEMENT (IOC);  Surgeon: Birder Robson, MD;  Location: [facility] ORS;  Service: Ophthalmology;  Laterality: Right;  [place] 00:52.0 AP% 16.8 CDE 8.74 Fluid Pack Lot # [id number]  . CATARACT EXTRACTION W/PHACO Left 09/04/2017   Procedure: CATARACT EXTRACTION PHACO AND INTRAOCULAR LENS PLACEMENT (IOC);  Surgeon: Birder Robson, MD;  Location: [facility] ORS;  Service: Ophthalmology;  Laterality: Left;  [place] 00:34 AP% 17.0 CDE 5.80 Fluid pack lot # [id number]  . CESAREAN SECTION    . CHOLECYSTECTOMY    . FRACTURE SURGERY    . HIP SURGERY    . INTRAMEDULLARY (IM) NAIL INTERTROCHANTERIC Right 10/01/2016   Procedure: INTRAMEDULLARY (IM) NAIL INTERTROCHANTRIC;  Surgeon: Corky Mull, MD;  Location: [facility] ORS;  Service: Orthopedics;  Laterality: Right;  . NECK SURGERY    . NOSE SURGERY    . ROTATOR CUFF REPAIR     x2  . SHOULDER ARTHROSCOPY  12/07/2011   Procedure: ARTHROSCOPY SHOULDER;  Surgeon: Ninetta Lights, MD;  Location: [place];  Service: Orthopedics;  Laterality: Right;  Debridement Partial Cuff Tear, Release Coracoacromial Ligament  . SHOULDER SURGERY  12/07/2011   right    Prior to Admission medications   Medication Sig Start Date End Date Taking? Authorizing Provider  ALPRAZolam Duanne Moron) 1 MG tablet Take 1 tablet (1 mg total) by mouth 4 (four) times daily as needed. Patient taking differently: Take 1 mg by mouth 2 (two) times daily as needed.  10/04/16   Epifanio Lesches, MD  buPROPion (WELLBUTRIN SR) 150 MG 12 hr tablet Take 150 mg by mouth daily.     [provider]  Cholecalciferol (VITAMIN D3) 5000 units TABS Take 1 tablet (5,000 Units  total) by mouth daily. 05/08/17   Vevelyn Francois, NP  escitalopram (LEXAPRO) 10 MG tablet Take 10 mg by mouth daily.  10/15/13   [provider]  Fluticasone-Salmeterol (ADVAIR) 100-50 MCG/DOSE AEPB Inhale 1 puff into the lungs every 12 (twelve) hours.    [provider]  hydrochlorothiazide (MICROZIDE) 12.5 MG capsule Take 12.5 mg by mouth daily.    [provider]  Ipratropium-Albuterol (COMBIVENT RESPIMAT) 20-100 MCG/ACT AERS respimat Inhale 1 puff into the lungs every 6 (six) hours as needed for wheezing or shortness of breath.    [provider]  levothyroxine (SYNTHROID, LEVOTHROID) 88 MCG tablet Take 88 mcg by mouth daily before breakfast.     [provider]  magnesium oxide (MAG-OX) 400 MG tablet Take 1 tablet (400 mg total) by mouth 2 (two) times daily. 03/30/18 06/28/18  Vevelyn Francois, NP  oxyCODONE (OXY IR/ROXICODONE) 5 MG immediate release tablet Take 1 tablet (5 mg total) by mouth every 6 (six) hours as needed for severe pain. 08/30/18 09/29/18  Vevelyn Francois, NP  oxyCODONE (OXY IR/ROXICODONE) 5  MG immediate release tablet Take 1 tablet (5 mg total) by mouth every 6 (six) hours as needed for severe pain. 07/31/18 08/30/18  Vevelyn Francois, NP  oxyCODONE (OXY IR/ROXICODONE) 5 MG immediate release tablet Take 1 tablet (5 mg total) by mouth every 6 (six) hours as needed for severe pain. 07/01/18 07/31/18  Vevelyn Francois, NP  tizanidine (ZANAFLEX) 2 MG capsule Take 1 capsule (2 mg total) by mouth 3 (three) times daily as needed for muscle spasms. 03/30/18 06/28/18  Vevelyn Francois, NP  traZODone (DESYREL) 150 MG tablet Take 300 mg by mouth at bedtime.     [provider]    Allergies Patient has no known allergies.  Family History  Problem Relation Age of Onset  . Aneurysm Unknown     Social History Social History   Tobacco Use  . Smoking status: Current Every Day Smoker    Packs/day: 0.50    Types: Cigarettes  . Smokeless  tobacco: Never Used  Substance Use Topics  . Alcohol use: No  . Drug use: No    Review of Systems Constitutional: No fever/chills Eyes: No visual changes. ENT: No sore throat. Cardiovascular: Denies chest pain. Respiratory: Positive for shortness of breath. Gastrointestinal: No abdominal pain.  No nausea, no vomiting.  No diarrhea.   Genitourinary: Negative for dysuria. Musculoskeletal: Positive for lower back pain. Skin: Positive for bruise to her forehead.  Neurological: Positive for headache.  ____________________________________________   PHYSICAL EXAM:  VITAL SIGNS: ED Triage Vitals  Enc Vitals Group     BP 08/17/18 1854 (!) 155/100     Pulse Rate 08/17/18 1854 (!) 103     Resp 08/17/18 1854 (!) 22     Temp 08/17/18 1854 97.8 F (36.6 C)     Temp Source 08/17/18 1854 Oral     SpO2 08/17/18 1854 93 %     Weight 08/17/18 1859 127 lb (57.6 kg)     Height 08/17/18 1859 5\' 4"  (1.626 m)     Head Circumference --      Peak Flow --      Pain Score 08/17/18 1859 0   Constitutional: Alert and oriented.  Eyes: Conjunctivae are normal.  ENT      Head: Normocephalic, bruise to forehead.       Nose: No congestion/rhinnorhea.      Mouth/Throat: Mucous membranes are moist.      Neck: No stridor. Hematological/Lymphatic/Immunilogical: No cervical lymphadenopathy. Cardiovascular: Normal rate, regular rhythm.  No murmurs, rubs, or gallops.  Respiratory: Normal respiratory effort without tachypnea nor retractions. Breath sounds are clear and equal bilaterally. No wheezes/rales/rhonchi. Gastrointestinal: Soft and non tender. No rebound. No guarding.  Genitourinary: Deferred Musculoskeletal: Tender to palpation over the lumbar spine. Wound to right bicep.  Neurologic:  Normal speech and language. No gross focal neurologic deficits are appreciated.  Skin:  Skin is warm, dry and intact. No rash noted. Psychiatric: Mood and affect are normal. Speech and behavior are normal. Patient  exhibits appropriate insight and judgment.  ____________________________________________    LABS (pertinent positives/negatives)  Trop <0.03 BNP 98 CBC wbc 8.8, hgb 13.4, plt 290 CMP na 139, k 3.2, ca 7.6 UA pending  ____________________________________________   EKG  I, Nance Pear, attending physician, personally viewed and interpreted this EKG  EKG Time: 1926 Rate: 96 Rhythm: sinus rhythm Axis: normal Intervals: qtc 493 QRS: narrow, q wave v1 ST changes: no st elevaton Impression: abnormal ekg   ____________________________________________    RADIOLOGY  CXR  No acute disease  CT head No acute abnormality  DG lumbar spine Concern for l3 endplate fracture  ____________________________________________   PROCEDURES  Procedures  ____________________________________________   INITIAL IMPRESSION / ASSESSMENT AND PLAN / ED COURSE  Pertinent labs & imaging results that were available during my care of the patient were reviewed by me and considered in my medical decision making (see chart for details).   Patient presented to the emergency department today with concerns after a fall a few days ago.  On exam patient does have some bruising and a wound to her right bicep. Patient also complaining of some shortness of breath. CXR without pneumonia.  X-ray is concerning for lumbar spine fracture.  Patient neurologically intact.  Awaiting urine at time of signout  ____________________________________________   FINAL CLINICAL IMPRESSION(S) / ED DIAGNOSES  Final diagnoses:  Acute cystitis with hematuria  Dehydration     Note: This dictation was prepared with Dragon dictation. Any transcriptional errors that result from this process are unintentional     Nance Pear, MD 08/18/18 1708

## 2018-08-18 ENCOUNTER — Emergency Department: Payer: Medicare HMO

## 2018-08-18 ENCOUNTER — Other Ambulatory Visit: Payer: Self-pay

## 2018-08-18 DIAGNOSIS — M48061 Spinal stenosis, lumbar region without neurogenic claudication: Secondary | ICD-10-CM | POA: Diagnosis present

## 2018-08-18 DIAGNOSIS — F17210 Nicotine dependence, cigarettes, uncomplicated: Secondary | ICD-10-CM | POA: Diagnosis present

## 2018-08-18 DIAGNOSIS — K449 Diaphragmatic hernia without obstruction or gangrene: Secondary | ICD-10-CM | POA: Diagnosis present

## 2018-08-18 DIAGNOSIS — M4856XA Collapsed vertebra, not elsewhere classified, lumbar region, initial encounter for fracture: Secondary | ICD-10-CM | POA: Diagnosis not present

## 2018-08-18 DIAGNOSIS — S32039A Unspecified fracture of third lumbar vertebra, initial encounter for closed fracture: Secondary | ICD-10-CM | POA: Diagnosis not present

## 2018-08-18 DIAGNOSIS — R0902 Hypoxemia: Secondary | ICD-10-CM | POA: Diagnosis not present

## 2018-08-18 DIAGNOSIS — E782 Mixed hyperlipidemia: Secondary | ICD-10-CM | POA: Diagnosis present

## 2018-08-18 DIAGNOSIS — G894 Chronic pain syndrome: Secondary | ICD-10-CM | POA: Diagnosis present

## 2018-08-18 DIAGNOSIS — E860 Dehydration: Secondary | ICD-10-CM | POA: Diagnosis not present

## 2018-08-18 DIAGNOSIS — M489 Spondylopathy, unspecified: Secondary | ICD-10-CM | POA: Diagnosis not present

## 2018-08-18 DIAGNOSIS — I10 Essential (primary) hypertension: Secondary | ICD-10-CM | POA: Diagnosis present

## 2018-08-18 DIAGNOSIS — Z9841 Cataract extraction status, right eye: Secondary | ICD-10-CM | POA: Diagnosis not present

## 2018-08-18 DIAGNOSIS — E039 Hypothyroidism, unspecified: Secondary | ICD-10-CM | POA: Diagnosis present

## 2018-08-18 DIAGNOSIS — R296 Repeated falls: Secondary | ICD-10-CM | POA: Diagnosis present

## 2018-08-18 DIAGNOSIS — N3001 Acute cystitis with hematuria: Secondary | ICD-10-CM | POA: Diagnosis present

## 2018-08-18 DIAGNOSIS — E785 Hyperlipidemia, unspecified: Secondary | ICD-10-CM | POA: Diagnosis present

## 2018-08-18 DIAGNOSIS — N390 Urinary tract infection, site not specified: Secondary | ICD-10-CM | POA: Diagnosis present

## 2018-08-18 DIAGNOSIS — I2510 Atherosclerotic heart disease of native coronary artery without angina pectoris: Secondary | ICD-10-CM | POA: Diagnosis present

## 2018-08-18 DIAGNOSIS — J441 Chronic obstructive pulmonary disease with (acute) exacerbation: Secondary | ICD-10-CM | POA: Diagnosis not present

## 2018-08-18 DIAGNOSIS — K219 Gastro-esophageal reflux disease without esophagitis: Secondary | ICD-10-CM | POA: Diagnosis present

## 2018-08-18 DIAGNOSIS — S32030A Wedge compression fracture of third lumbar vertebra, initial encounter for closed fracture: Secondary | ICD-10-CM | POA: Diagnosis not present

## 2018-08-18 DIAGNOSIS — Z981 Arthrodesis status: Secondary | ICD-10-CM | POA: Diagnosis not present

## 2018-08-18 DIAGNOSIS — R69 Illness, unspecified: Secondary | ICD-10-CM | POA: Diagnosis not present

## 2018-08-18 DIAGNOSIS — R2681 Unsteadiness on feet: Secondary | ICD-10-CM | POA: Diagnosis present

## 2018-08-18 DIAGNOSIS — Z9842 Cataract extraction status, left eye: Secondary | ICD-10-CM | POA: Diagnosis not present

## 2018-08-18 DIAGNOSIS — W010XXA Fall on same level from slipping, tripping and stumbling without subsequent striking against object, initial encounter: Secondary | ICD-10-CM | POA: Diagnosis present

## 2018-08-18 DIAGNOSIS — J449 Chronic obstructive pulmonary disease, unspecified: Secondary | ICD-10-CM | POA: Diagnosis present

## 2018-08-18 DIAGNOSIS — F419 Anxiety disorder, unspecified: Secondary | ICD-10-CM | POA: Diagnosis present

## 2018-08-18 DIAGNOSIS — Z8711 Personal history of peptic ulcer disease: Secondary | ICD-10-CM | POA: Diagnosis not present

## 2018-08-18 DIAGNOSIS — Z720 Tobacco use: Secondary | ICD-10-CM | POA: Diagnosis not present

## 2018-08-18 DIAGNOSIS — E876 Hypokalemia: Secondary | ICD-10-CM | POA: Diagnosis present

## 2018-08-18 DIAGNOSIS — S32000A Wedge compression fracture of unspecified lumbar vertebra, initial encounter for closed fracture: Secondary | ICD-10-CM | POA: Diagnosis not present

## 2018-08-18 DIAGNOSIS — F329 Major depressive disorder, single episode, unspecified: Secondary | ICD-10-CM | POA: Diagnosis present

## 2018-08-18 LAB — BASIC METABOLIC PANEL
ANION GAP: 8 (ref 5–15)
BUN: 12 mg/dL (ref 8–23)
CHLORIDE: 104 mmol/L (ref 98–111)
CO2: 30 mmol/L (ref 22–32)
CREATININE: 0.82 mg/dL (ref 0.44–1.00)
Calcium: 8.3 mg/dL — ABNORMAL LOW (ref 8.9–10.3)
GFR calc non Af Amer: >60 mL/min (ref >60)
Glucose, Bld: 138 mg/dL — ABNORMAL HIGH (ref 70–99)
POTASSIUM: 2.8 mmol/L — AB (ref 3.5–5.1)
SODIUM: 142 mmol/L (ref 135–145)

## 2018-08-18 LAB — CBC
HEMATOCRIT: 35.7 % (ref 35.0–47.0)
HEMOGLOBIN: 12.5 g/dL (ref 12.0–16.0)
MCH: 31.8 pg (ref 26.0–34.0)
MCHC: 34.9 g/dL (ref 32.0–36.0)
MCV: 91.1 fL (ref 80.0–100.0)
Platelets: 302 10*3/uL (ref 150–440)
RBC: 3.92 MIL/uL (ref 3.80–5.20)
RDW: 14.6 % — ABNORMAL HIGH (ref 11.5–14.5)
WBC: 7.3 10*3/uL (ref 3.6–11.0)

## 2018-08-18 LAB — MAGNESIUM: Magnesium: 1.7 mg/dL (ref 1.7–2.4)

## 2018-08-18 LAB — GLUCOSE, CAPILLARY: Glucose-Capillary: 81 mg/dL (ref 70–99)

## 2018-08-18 LAB — POTASSIUM: Potassium: 3.6 mmol/L (ref 3.5–5.1)

## 2018-08-18 MED ORDER — ESCITALOPRAM OXALATE 10 MG PO TABS
10.0000 mg | ORAL_TABLET | Freq: Every day | ORAL | Status: DC
  Administered 2018-08-18 – 2018-08-21 (×4): 10 mg via ORAL
  Filled 2018-08-18 (×4): qty 1

## 2018-08-18 MED ORDER — FLUTICASONE FUROATE-VILANTEROL 100-25 MCG/INH IN AEPB
1.0000 | INHALATION_SPRAY | Freq: Every day | RESPIRATORY_TRACT | Status: DC
  Administered 2018-08-18 – 2018-08-21 (×4): 1 via RESPIRATORY_TRACT
  Filled 2018-08-18: qty 28

## 2018-08-18 MED ORDER — SODIUM CHLORIDE 0.9 % IV SOLN
Freq: Once | INTRAVENOUS | Status: AC
  Administered 2018-08-18: 04:00:00 via INTRAVENOUS

## 2018-08-18 MED ORDER — POTASSIUM CHLORIDE CRYS ER 20 MEQ PO TBCR
40.0000 meq | EXTENDED_RELEASE_TABLET | Freq: Four times a day (QID) | ORAL | Status: AC
  Administered 2018-08-18: 40 meq via ORAL
  Filled 2018-08-18: qty 2

## 2018-08-18 MED ORDER — ONDANSETRON HCL 4 MG/2ML IJ SOLN: 4.0000 mg | Freq: Four times a day (QID) | INTRAMUSCULAR | Status: DC | PRN

## 2018-08-18 MED ORDER — SODIUM CHLORIDE 0.9 % IV SOLN
INTRAVENOUS | Status: DC | PRN
  Administered 2018-08-18: 21:00:00 250 mL via INTRAVENOUS

## 2018-08-18 MED ORDER — AMLODIPINE BESYLATE 5 MG PO TABS
5.0000 mg | ORAL_TABLET | Freq: Every day | ORAL | Status: DC
  Administered 2018-08-18 – 2018-08-21 (×4): 5 mg via ORAL
  Filled 2018-08-18 (×4): qty 1

## 2018-08-18 MED ORDER — ALPRAZOLAM 1 MG PO TABS
1.0000 mg | ORAL_TABLET | Freq: Two times a day (BID) | ORAL | Status: DC | PRN
  Administered 2018-08-18 – 2018-08-21 (×6): 1 mg via ORAL
  Filled 2018-08-18 (×6): qty 1

## 2018-08-18 MED ORDER — MAGNESIUM SULFATE 2 GM/50ML IV SOLN
2.0000 g | Freq: Once | INTRAVENOUS | Status: AC
  Administered 2018-08-18: 10:00:00 2 g via INTRAVENOUS
  Filled 2018-08-18: qty 50

## 2018-08-18 MED ORDER — ACETAMINOPHEN 650 MG RE SUPP: 650.0000 mg | Freq: Four times a day (QID) | RECTAL | Status: DC | PRN

## 2018-08-18 MED ORDER — OXYCODONE HCL 5 MG PO TABS
5.0000 mg | ORAL_TABLET | Freq: Four times a day (QID) | ORAL | Status: DC | PRN
  Administered 2018-08-18 – 2018-08-21 (×10): 5 mg via ORAL
  Filled 2018-08-18 (×11): qty 1

## 2018-08-18 MED ORDER — DOCUSATE SODIUM 100 MG PO CAPS
100.0000 mg | ORAL_CAPSULE | Freq: Two times a day (BID) | ORAL | Status: DC
  Administered 2018-08-18 – 2018-08-20 (×6): 100 mg via ORAL
  Filled 2018-08-18 (×7): qty 1

## 2018-08-18 MED ORDER — BUPROPION HCL ER (SR) 150 MG PO TB12
150.0000 mg | ORAL_TABLET | Freq: Every day | ORAL | Status: DC
  Administered 2018-08-18 – 2018-08-21 (×4): 150 mg via ORAL
  Filled 2018-08-18 (×4): qty 1

## 2018-08-18 MED ORDER — HYDROCODONE-ACETAMINOPHEN 5-325 MG PO TABS
1.0000 | ORAL_TABLET | ORAL | Status: DC | PRN
  Administered 2018-08-18 (×2): 2 via ORAL
  Administered 2018-08-18: 05:00:00 1 via ORAL
  Administered 2018-08-18 – 2018-08-20 (×4): 2 via ORAL
  Filled 2018-08-18: qty 2
  Filled 2018-08-18: qty 1
  Filled 2018-08-18 (×5): qty 2

## 2018-08-18 MED ORDER — ACETAMINOPHEN 325 MG PO TABS: 650.0000 mg | ORAL_TABLET | Freq: Four times a day (QID) | ORAL | Status: DC | PRN

## 2018-08-18 MED ORDER — LEVOTHYROXINE SODIUM 88 MCG PO TABS
88.0000 ug | ORAL_TABLET | Freq: Every day | ORAL | Status: DC
  Administered 2018-08-18 – 2018-08-21 (×4): 88 ug via ORAL
  Filled 2018-08-18 (×4): qty 1

## 2018-08-18 MED ORDER — BISACODYL 5 MG PO TBEC
5.0000 mg | DELAYED_RELEASE_TABLET | Freq: Every day | ORAL | Status: DC | PRN
  Administered 2018-08-18 – 2018-08-21 (×3): 5 mg via ORAL
  Filled 2018-08-18 (×3): qty 1

## 2018-08-18 MED ORDER — ONDANSETRON HCL 4 MG PO TABS: 4.0000 mg | ORAL_TABLET | Freq: Four times a day (QID) | ORAL | Status: DC | PRN

## 2018-08-18 MED ORDER — HEPARIN SODIUM (PORCINE) 5000 UNIT/ML IJ SOLN
5000.0000 [IU] | Freq: Three times a day (TID) | INTRAMUSCULAR | Status: DC
  Filled 2018-08-18 (×2): qty 1

## 2018-08-18 MED ORDER — HYDROMORPHONE HCL 1 MG/ML IJ SOLN: 0.5000 mg | Freq: Once | INTRAMUSCULAR | Status: DC

## 2018-08-18 MED ORDER — POTASSIUM CHLORIDE CRYS ER 20 MEQ PO TBCR
40.0000 meq | EXTENDED_RELEASE_TABLET | Freq: Two times a day (BID) | ORAL | Status: DC
  Administered 2018-08-18: 40 meq via ORAL
  Filled 2018-08-18: qty 2

## 2018-08-18 MED ORDER — TRAZODONE HCL 50 MG PO TABS: 25.0000 mg | ORAL_TABLET | Freq: Every evening | ORAL | Status: DC | PRN

## 2018-08-18 MED ORDER — IPRATROPIUM-ALBUTEROL 20-100 MCG/ACT IN AERS: 1.0000 | INHALATION_SPRAY | Freq: Four times a day (QID) | RESPIRATORY_TRACT | Status: DC | PRN

## 2018-08-18 MED ORDER — VITAMIN D 1000 UNITS PO TABS
5000.0000 [IU] | ORAL_TABLET | Freq: Every day | ORAL | Status: DC
  Administered 2018-08-18 – 2018-08-21 (×3): 5000 [IU] via ORAL
  Filled 2018-08-18 (×3): qty 5

## 2018-08-18 MED ORDER — IPRATROPIUM-ALBUTEROL 0.5-2.5 (3) MG/3ML IN SOLN
3.0000 mL | Freq: Four times a day (QID) | RESPIRATORY_TRACT | Status: DC | PRN
  Administered 2018-08-18 – 2018-08-21 (×3): 3 mL via RESPIRATORY_TRACT
  Filled 2018-08-18 (×3): qty 3

## 2018-08-18 MED ORDER — HYDRALAZINE HCL 20 MG/ML IJ SOLN
10.0000 mg | Freq: Four times a day (QID) | INTRAMUSCULAR | Status: DC | PRN
  Administered 2018-08-20: 10 mg via INTRAVENOUS
  Filled 2018-08-18: qty 1

## 2018-08-18 MED ORDER — SODIUM CHLORIDE 0.9 % IV SOLN
1.0000 g | INTRAVENOUS | Status: DC
  Administered 2018-08-18 – 2018-08-20 (×4): 1 g via INTRAVENOUS
  Filled 2018-08-18: qty 10
  Filled 2018-08-18 (×3): qty 1

## 2018-08-18 NOTE — Progress Notes (Signed)
PT Cancellation Note  Patient Details Name: [name] MRN: [id number] DOB: [birth year]   Cancelled Treatment:    Reason Eval/Treat Not Completed: Medical issues which prohibited therapy.  Pending consult for orthopedics regarding L3 fracture, and will try later as time and pt allow.   Ramond Dial 08/18/2018, 10:09 AM  Mee Hives, PT MS Acute Rehab Dept. Number: [place]

## 2018-08-18 NOTE — Consult Note (Signed)
ORTHOPAEDIC CONSULTATION  REQUESTING PHYSICIAN: Bettey Costa, MD  Chief Complaint:   Low back pain.  History of Present Illness: [name] is a 72 y.o. female with multiple medical problems including COPD, coronary artery disease, anxiety/depression, tobacco abuse, and osteoporosis who normally lives independently at home.  She notes that she falls frequently due to lower extremity weakness and poor balance.  She recalls falling 2 days ago onto her tailbone when her foot slipped on her wooden floor as she was turning a corner.  Since then, she has noted increased lower back pain making it difficult for her to get around.  She presented to the emergency room last evening.  Subsequent work-up included a lumbar MRI scan which confirmed the presence of an acute L3 compression fracture.  The patient has been admitted for pain control as well as for further evaluation and treatment.  The patient denies any associated injury resulting from her most recent fall.  She did not strike her head or lose consciousness.  She also denies any lightheadedness, dizziness, chest pain, shortness of breath, or other symptoms which may have precipitated her fall.  Past Medical History:  Diagnosis Date  . Anxiety   . CAD (coronary artery disease)   . Carpal tunnel syndrome   . Cataract   . Complication of anesthesia    has woken up at times  . COPD (chronic obstructive pulmonary disease) ([place])   . CRP elevated 09/14/2015  . Depression   . Difficult intubation   . Dyslipidemia   . Dyspnea    DOE  . Dysrhythmia    hx palpatations  . Elevated sedimentation rate 09/14/2015  . Esophageal spasm   . Gastrointestinal parasites   . GERD (gastroesophageal reflux disease)   . Hiatal hernia   . History of peptic ulcer disease   . Hyperthyroidism   . Hypothyroidism   . Low magnesium levels 09/14/2015  . Pelvic fracture ([place]) 2008   fall from riding  a horse  . Reflux   . Rotator cuff injury   . Stenosis, spinal, lumbar    Past Surgical History:  Procedure Laterality Date  . APPENDECTOMY    . BACK SURGERY     CERVICAL FUSION  . CARPAL TUNNEL RELEASE    . CATARACT EXTRACTION W/PHACO Right 08/07/2017   Procedure: CATARACT EXTRACTION PHACO AND INTRAOCULAR LENS PLACEMENT (IOC);  Surgeon: Birder Robson, MD;  Location: [facility];  Service: Ophthalmology;  Laterality: Right;  [place] 00:52.0 AP% 16.8 CDE 8.74 Fluid Pack Lot # [id number]  . CATARACT EXTRACTION W/PHACO Left 09/04/2017   Procedure: CATARACT EXTRACTION PHACO AND INTRAOCULAR LENS PLACEMENT (IOC);  Surgeon: Birder Robson, MD;  Location: [facility];  Service: Ophthalmology;  Laterality: Left;  [place] 00:34 AP% 17.0 CDE 5.80 Fluid pack lot # [id number]  . CESAREAN SECTION    . CHOLECYSTECTOMY    . FRACTURE SURGERY    . HIP SURGERY    . INTRAMEDULLARY (IM) NAIL INTERTROCHANTERIC Right 10/01/2016   Procedure: INTRAMEDULLARY (IM) NAIL INTERTROCHANTRIC;  Surgeon: Corky Mull, MD;  Location: [facility];  Service: Orthopedics;  Laterality: Right;  . NECK SURGERY    . NOSE SURGERY    . ROTATOR CUFF REPAIR     x2  . SHOULDER ARTHROSCOPY  12/07/2011   Procedure: ARTHROSCOPY SHOULDER;  Surgeon: Ninetta Lights, MD;  Location: [facility];  Service: Orthopedics;  Laterality: Right;  Debridement Partial Cuff Tear, Release Coracoacromial Ligament  . SHOULDER SURGERY  12/07/2011   right  Social History   Socioeconomic History  . Marital status: Married    Spouse name: Not on file  . Number of children: Not on file  . Years of education: Not on file  . Highest education level: Not on file  Occupational History  . Occupation: retired Air cabin crew  Social Needs  . Financial resource strain: Not on file  . Food insecurity:    Worry: Not on file    Inability: Not on file  . Transportation needs:    Medical: Not on file    Non-medical: Not on file  Tobacco Use  .  Smoking status: Current Every Day Smoker    Packs/day: 0.50    Types: Cigarettes  . Smokeless tobacco: Never Used  Substance and Sexual Activity  . Alcohol use: No  . Drug use: No  . Sexual activity: Never  Lifestyle  . Physical activity:    Days per week: Not on file    Minutes per session: Not on file  . Stress: Not on file  Relationships  . Social connections:    Talks on phone: Not on file    Gets together: Not on file    Attends religious service: Not on file    Active member of club or organization: Not on file    Attends meetings of clubs or organizations: Not on file    Relationship status: Not on file  Other Topics Concern  . Not on file  Social History Narrative  . Not on file   Family History  Problem Relation Age of Onset  . Aneurysm Unknown    No Known Allergies Prior to Admission medications   Medication Sig Start Date End Date Taking? Authorizing Provider  ALPRAZolam Duanne Moron) 1 MG tablet Take 1 tablet (1 mg total) by mouth 4 (four) times daily as needed. Patient taking differently: Take 1 mg by mouth 2 (two) times daily as needed.  10/04/16  Yes Epifanio Lesches, MD  buPROPion (WELLBUTRIN SR) 150 MG 12 hr tablet Take 150 mg by mouth daily.    Yes [provider]  Cholecalciferol (VITAMIN D3) 5000 units TABS Take 1 tablet (5,000 Units total) by mouth daily. 05/08/17  Yes Vevelyn Francois, NP  escitalopram (LEXAPRO) 10 MG tablet Take 10 mg by mouth daily.  10/15/13  Yes [provider]  Fluticasone-Salmeterol (ADVAIR) 100-50 MCG/DOSE AEPB Inhale 1 puff into the lungs every 12 (twelve) hours.   Yes [provider]  hydrochlorothiazide (MICROZIDE) 12.5 MG capsule Take 12.5 mg by mouth daily.   Yes [provider]  Ipratropium-Albuterol (COMBIVENT RESPIMAT) 20-100 MCG/ACT AERS respimat Inhale 1 puff into the lungs every 6 (six) hours as needed for wheezing or shortness of breath.   Yes [provider]  levothyroxine  (SYNTHROID, LEVOTHROID) 88 MCG tablet Take 88 mcg by mouth daily before breakfast.    Yes [provider]  magnesium oxide (MAG-OX) 400 MG tablet Take 1 tablet (400 mg total) by mouth 2 (two) times daily. 03/30/18 08/17/18 Yes Vevelyn Francois, NP  oxyCODONE (OXY IR/ROXICODONE) 5 MG immediate release tablet Take 1 tablet (5 mg total) by mouth every 6 (six) hours as needed for severe pain. 08/30/18 09/29/18 Yes Vevelyn Francois, NP  traZODone (DESYREL) 150 MG tablet Take 300 mg by mouth at bedtime.    Yes [provider]  oxyCODONE (OXY IR/ROXICODONE) 5 MG immediate release tablet Take 1 tablet (5 mg total) by mouth every 6 (six) hours as needed for severe pain. 07/01/18  07/31/18  Vevelyn Francois, NP  tizanidine (ZANAFLEX) 2 MG capsule Take 1 capsule (2 mg total) by mouth 3 (three) times daily as needed for muscle spasms. 03/30/18 06/28/18  Vevelyn Francois, NP   Dg Chest 2 View  Result Date: 08/17/2018 CLINICAL DATA:  Dyspnea EXAM: CHEST - 2 VIEW COMPARISON:  10/01/2016 chest radiograph. FINDINGS: Surgical hardware from [facility]CD[facility] overlies the lower cervical spine. Stable cardiomediastinal silhouette with normal heart size. No pneumothorax. No pleural effusion. Lungs appear clear, with no acute consolidative airspace disease and no pulmonary edema. IMPRESSION: No active cardiopulmonary disease. Electronically Signed   By: Ilona Sorrel M.D.   On: 08/17/2018 20:45   Dg Lumbar Spine Complete  Result Date: 08/17/2018 CLINICAL DATA:  Low back pain after multiple falls. EXAM: LUMBAR SPINE - COMPLETE 4+ VIEW COMPARISON:  Radiographs of June 18, 2017.  MRI of June 18, 2017. FINDINGS: There appears to be mild inferior endplate fracture involving the L3 vertebral body. Disc spaces are well-maintained. No spondylolisthesis is noted. Atherosclerosis of abdominal aorta is noted. IMPRESSION: Probable inferior endplate fracture involving L3 vertebral body. MRI is recommended for further evaluation. Aortic  Atherosclerosis (ICD10-I70.0). Electronically Signed   By: Marijo Conception, M.D.   On: 08/17/2018 20:48   Dg Tibia/fibula Left  Result Date: 08/17/2018 CLINICAL DATA:  Fall, pain EXAM: LEFT TIBIA AND FIBULA - 2 VIEW COMPARISON:  None. FINDINGS: No fracture or dislocation is seen. The joint spaces are preserved. Visualized soft tissues are within normal limits. No suprapatellar knee joint effusion. IMPRESSION: Negative. Electronically Signed   By: Julian Hy M.D.   On: 08/17/2018 23:21   Ct Head Wo Contrast  Result Date: 08/17/2018 CLINICAL DATA:  For head injury after multiple falls. EXAM: CT HEAD WITHOUT CONTRAST TECHNIQUE: Contiguous axial images were obtained from the base of the skull through the vertex without intravenous contrast. COMPARISON:  CT scan of October 01, 2016. FINDINGS: Brain: Mild chronic ischemic white matter disease is noted. No mass effect or midline shift is noted. Ventricular size is within normal limits. There is no evidence of mass lesion, hemorrhage or acute infarction. Vascular: No hyperdense vessel or unexpected calcification. Skull: Normal. Negative for fracture or focal lesion. Sinuses/Orbits: No acute finding. Other: None. IMPRESSION: Mild chronic ischemic white matter disease. No acute intracranial abnormality seen. Electronically Signed   By: Marijo Conception, M.D.   On: 08/17/2018 20:41   Mr Lumbar Spine Wo Contrast  Result Date: 08/18/2018 CLINICAL DATA:  Low back pain after multiple falls. Follow-up lumbar spine fracture. EXAM: MRI LUMBAR SPINE WITHOUT CONTRAST TECHNIQUE: Multiplanar, multisequence MR imaging of the lumbar spine was performed. No intravenous contrast was administered. COMPARISON:  Lumbar spine radiographs August 09, 2018 and MRI of the lumbar spine June 18, 2017 FINDINGS: SEGMENTATION: For the purposes of this report, the last well-formed intervertebral disc is reported as L5-S1. ALIGNMENT: Maintained lumbar lordosis. Grade 1 L3-4  anterolisthesis. No spondylolysis. VERTEBRAE:Acute L3 inferior endplate fracture with approximately 30% height loss. Old mild T12 compression fracture or Schmorl's node. Old moderate L5 compression fracture with 30-50% height loss. Mild subacute discogenic endplate changes A4-1. Generally bright T1 bone marrow signal compatible with osteopenia. CONUS MEDULLARIS AND CAUDA EQUINA: Conus medullaris terminates at T12 and demonstrates normal morphology and signal characteristics. Cauda equina is normal. PARASPINAL AND OTHER SOFT TISSUES: Nonacute. 3 cm T2 bright cyst LEFT kidney. DISC LEVELS: T12-L1 and L1-2: No disc bulge, canal stenosis nor neural foraminal narrowing. L2-3: Similar moderate RIGHT extraforaminal disc  protrusion encroaching upon the exited RIGHT L2 nerve. No canal stenosis. No neural foraminal narrowing. L3-4: Anterolisthesis. Annular bulging. Moderate facet arthropathy and ligamentum flavum redundancy. 11 mm RIGHT and 5 mm LEFT facet synovial cysts within paraspinal soft tissues. 4 mm LEFT facet synovial cyst within LEFT extraforaminal soft tissues. Moderate canal stenosis. Moderate RIGHT and moderate to severe LEFT neural foraminal narrowing. L4-5: Similar moderate central disc protrusion. Moderate facet arthropathy and ligamentum flavum redundancy. Moderate canal stenosis. Mild-to-moderate LEFT neural foraminal narrowing. L5-S1: Transitional anatomy. No disc bulge, canal stenosis nor neural foraminal narrowing. IMPRESSION: 1. Acute mild-to-moderate L3 inferior endplate compression fracture. Old T12 and L5 compression fractures. Osteopenia. 2. Grade 1 L3-4 anterolisthesis without spondylolysis. 3. Moderate canal stenosis L3-4 and L4-5. Neural foraminal narrowing L3-4 and L4-5: Moderate to severe on the LEFT at L3-4. Electronically Signed   By: Elon Alas M.D.   On: 08/18/2018 01:38    Positive ROS: All other systems have been reviewed and were otherwise negative with the exception of those  mentioned in the HPI and as above.  Physical Exam: General:  Alert, no acute distress Psychiatric:  Patient is competent for consent with normal mood and affect   Cardiovascular:  No pedal edema Respiratory:  No wheezing, non-labored breathing GI:  Abdomen is soft and non-tender Skin:  No lesions in the area of chief complaint Neurologic:  Sensation intact distally Lymphatic:  No axillary or cervical lymphadenopathy  Orthopedic Exam:  Orthopedic examination is limited to the patient's back and lower extremities.  Skin inspection of her back is unremarkable.  There is no swelling, erythema, ecchymosis, abrasions, or other skin normality is identified.  The patient is able to sit up on her own in bed with only minimal discomfort.  She has at most minimal tenderness to percussion over the mid and lower back regions.  She is neurovascularly intact in both lower extremities and is able to perform active straight leg raises with each leg.  X-rays:  A recent MRI scan of the lumbar spine is available for review has been reviewed by myself.  By report, the MRI scan demonstrates evidence of an acute inferior endplate fracture involving the anterior portion of L3 with some loss of anterior vertebral height.  There also appears to be evidence of a mild grade 1 anterolisthesis of L3 on L4, resulting in moderate central stenosis.  At this level, there is "moderate right and moderate to severe left neural foraminal narrowing.  At L4-5, there is moderate central canal stenosis as well as "mild-to-moderate left neural foraminal narrowing.".  Assessment: Acute anterior wedge compression fracture of L3.  Plan: Treatment options have been discussed with the patient including both surgical and nonsurgical choices.  The patient would like to pursue nonsurgical intervention if at all possible.  Therefore, I feel that she may begin to be mobilized with physical therapy.  She may use a lumbosacral corset as necessary  for comfort.  If she has difficulty tolerating mobilization, then I will ask Dr. Rudene Christians to discuss the potential benefits of a kyphoplasty with her.  Thank you for asking me to participate in the care of this most pleasant woman.  I will be happy to follow her with you.   Pascal Lux, MD  Beeper #:  [phone]  08/18/2018 11:39 AM

## 2018-08-18 NOTE — ED Notes (Signed)
Patient transported to MRI 

## 2018-08-18 NOTE — Progress Notes (Signed)
MEDICATION RELATED CONSULT NOTE - INITIAL   Pharmacy Consult for electrolyte management Indication: hypokalemia  No Known Allergies  Patient Measurements: Height: 5\' 4"  (162.6 cm) Weight: 124 lb (56.2 kg) IBW/kg (Calculated) : 54.7 Adjusted Body Weight:   Vital Signs: Temp: 98.3 F (36.8 C) (09/29 1539) Temp Source: Oral (09/29 1539) BP: 166/92 (09/29 1539) Pulse Rate: 92 (09/29 1539) Intake/Output from previous day: 09/28 0701 - 09/29 0700 In: 147.9 [I.V.:147.9] Out: -  Intake/Output from this shift: Total I/O In: 41.1 [IV Piggyback:41.1] Out: -   Labs: Recent Labs    08/17/18 1938 08/18/18 0406  WBC 8.8 7.3  HGB 13.4 12.5  HCT 38.8 35.7  PLT 290 302  CREATININE 0.70 0.82  MG  --  1.7  ALBUMIN 2.9*  --   PROT 5.4*  --   AST 18  --   ALT 25  --   ALKPHOS 139*  --   BILITOT 0.4  --    Estimated Creatinine Clearance: 54.3 mL/min (by C-G formula based on SCr of 0.82 mg/dL).   Microbiology: No results found for this or any previous visit (from the past 720 hour(s)).  Medical History: Past Medical History:  Diagnosis Date  . Anxiety   . CAD (coronary artery disease)   . Carpal tunnel syndrome   . Cataract   . Complication of anesthesia    has woken up at times  . COPD (chronic obstructive pulmonary disease) ([place])   . CRP elevated 09/14/2015  . Depression   . Difficult intubation   . Dyslipidemia   . Dyspnea    DOE  . Dysrhythmia    hx palpatations  . Elevated sedimentation rate 09/14/2015  . Esophageal spasm   . Gastrointestinal parasites   . GERD (gastroesophageal reflux disease)   . Hiatal hernia   . History of peptic ulcer disease   . Hyperthyroidism   . Hypothyroidism   . Low magnesium levels 09/14/2015  . Pelvic fracture ([place]) 2008   fall from riding a horse  . Reflux   . Rotator cuff injury   . Stenosis, spinal, lumbar     Medications:  Infusions:  . cefTRIAXone (ROCEPHIN)  IV      Assessment: 72 yof cc SOB with PMH tobacco  abuse, COPD, CAD, anxiety/depression, frequent falls, lumbar stenosis and other comorbidities. She is being worked up for UTI and gait issues. Potassium noted to be low. Pharmacy consulted to manage electrolytes.  Goal of Therapy:  K 3.5 to 5 Ca 8.9 to 10.3 Mg 1.7 to 2.4 Phos 2.5 to 4.6  Plan:  Give potassium chloride 40 mEq po Q6H x 2 doses this morning. Check add-on magnesium and replete as needed. Recheck potassium tonight with evening labs. Recheck all electrolytes tomorrow with AM labs.  ADDENDUM: add-on magnesium returns as 1.7. Give magnesium sulfate 2 gm IV x 1. Recheck all electrolytes tomorrow with AM labs.   9/29:  K @ 1752 = 3.6.  No additional K supplementation needed at this time.  Will recheck electrolytes on 9/30 with AM labs.   Keygan Dumond D, Pharm.D Clinical Pharmacist 08/18/2018,6:36 PM

## 2018-08-18 NOTE — Plan of Care (Signed)
  Problem: Clinical Measurements: Goal: Respiratory complications will improve Outcome: Progressing   Problem: Activity: Goal: Risk for activity intolerance will decrease Outcome: Progressing   Problem: Safety: Goal: Ability to remain free from injury will improve Outcome: Progressing   Problem: Pain Managment: Goal: General experience of comfort will improve Outcome: Not Progressing  PRN pain medications given per pt's request for chronic low back pain

## 2018-08-18 NOTE — Progress Notes (Signed)
Patient briefly seen and examined. Patient presents with hypokalemia and UTI.  Patient also presents with severe back pain on top of chronic pain. Agree with admitting MD physician plan  We will add pharmacy consultation for hypokalemia I will add Norvasc for elevated blood pressure and control pain. PRN hydralazine ordered

## 2018-08-18 NOTE — Progress Notes (Signed)
   08/18/18 1510  Clinical Encounter Type  Visited With Patient  Visit Type Initial (order for advanced directive)  Referral From Physician  Consult/Referral To Chaplain  Spiritual Encounters  Spiritual Needs Emotional   Responded to order for advanced directive; patient did not request AD.  Chaplain utilized active and reflective listening as patient processed relationships in her life and how she copes with stress.  Patient reported feeling pain and fatigue; chaplain excused herself to allow patient time to rest.  Chaplain encouraged patient to reach out as needed.

## 2018-08-18 NOTE — Evaluation (Signed)
Physical Therapy Evaluation Patient Details Name: [name] MRN: [id number] DOB: Jun 21, 1946 Today's Date: 08/18/2018   History of Present Illness  72 yo female with onset of fall and subsequent admission for UTI was noted to have L3 compression fracture.  Per nsg pt typically takes a great deal of meds at home, has been asking for a lot of doses here.  Did not complain of pain with PT until end of walk, at which point she asked for pain meds.  Pt is sitting unsupported on bed, not having difficulty with it and therefore does not need brace. PMHx:  anxiety, CAD, CTS, anesthesia complications (waking up), COPD, depression, esophageal spasm, pelvic fracture, PUD, rotator cuff injury, spinal stenosis,    Clinical Impression  Pt was seen for evaluation of mobility and strength, mainly noting her impulsive nature and decline on room air to 92% then back to 97% for saturation.  Pt is not concerned about this, and PT did replace the cannula as nursing was being careful given the amount of medication pt has been requiring for pain.  However, she did not complain until the end of walking and asked for meds stating she had been asking all day to get more.  Pt is moving fairly well but cannot reasonably walk alone given her impulsivity and safety with turns.  Follow acutely and will try to transition home if able but for now is looking appropriate for SNF to increase safe use of AD.  Has 3 steps no rails to enter her house.    Follow Up Recommendations Supervision for mobility/OOB;SNF    Equipment Recommendations  None recommended by PT(has a RW)    Recommendations for Other Services       Precautions / Restrictions Precautions Precautions: Fall Precaution Comments: ck O2 sats with activity Restrictions Weight Bearing Restrictions: No      Mobility  Bed Mobility Overal bed mobility: Modified Independent                Transfers Overall transfer level: Modified independent                   Ambulation/Gait Ambulation/Gait assistance: Min guard Gait Distance (Feet): 35 Feet Assistive device: IV Pole(pt is in a hurry to walk and does not want to use AD) Gait Pattern/deviations: Step-through pattern;Decreased stride length;Trunk flexed;Wide base of support Gait velocity: reduced   General Gait Details: pt is using rolling IV pole with no assistance but min guard used for safety  Stairs            Wheelchair Mobility    Modified Rankin (Stroke Patients Only)       Balance Overall balance assessment: History of Falls;Needs assistance Sitting-balance support: Feet supported Sitting balance-Leahy Scale: Good     Standing balance support: Bilateral upper extremity supported;During functional activity Standing balance-Leahy Scale: Fair                               Pertinent Vitals/Pain Pain Assessment: No/denies pain(after gait was at 2 on faces scale)    Home Living Family/patient expects to be discharged to:: Private residence Living Arrangements: Alone Available Help at Discharge: Friend(s);Available PRN/intermittently Type of Home: House Home Access: Stairs to enter Entrance Stairs-Rails: None Entrance Stairs-Number of Steps: 3 Home Layout: One level Home Equipment: Walker - 2 wheels;Cane - single point;Shower seat Additional Comments: pt has been getting some paid assistance but has not recently over  conflicts with caregivers    Prior Function Level of Independence: Independent         Comments: no AD used recently but has many falls     Hand Dominance   Dominant Hand: Right    Extremity/Trunk Assessment   Upper Extremity Assessment Upper Extremity Assessment: Overall WFL for tasks assessed    Lower Extremity Assessment Lower Extremity Assessment: Overall WFL for tasks assessed    Cervical / Trunk Assessment Cervical / Trunk Assessment: Kyphotic  Communication   Communication: HOH  Cognition  Arousal/Alertness: Awake/alert Behavior During Therapy: Impulsive Overall Cognitive Status: No family/caregiver present to determine baseline cognitive functioning                                 General Comments: pt is impulsive and struggling to follow instructions but is agitated over wanting pain meds      General Comments General comments (skin integrity, edema, etc.): pt had O2 sat ck with movement and noted 92% which rapidly recovered to 97% on room air    Exercises     Assessment/Plan    PT Assessment Patient needs continued PT services  PT Problem List Decreased range of motion;Decreased activity tolerance;Decreased balance;Decreased mobility;Decreased coordination;Decreased knowledge of use of DME;Decreased safety awareness;Cardiopulmonary status limiting activity;Pain;Decreased skin integrity(bruising from recent fall)       PT Treatment Interventions DME instruction;Gait training;Stair training;Functional mobility training;Therapeutic activities;Therapeutic exercise;Balance training;Neuromuscular re-education;Patient/family education    PT Goals (Current goals can be found in the Care Plan section)  Acute Rehab PT Goals Patient Stated Goal: to go directly home PT Goal Formulation: With patient Time For Goal Achievement: 09/01/18 Potential to Achieve Goals: Fair    Frequency 7X/week   Barriers to discharge Inaccessible home environment;Decreased caregiver support home alone with fall risk     Co-evaluation               AM-PAC PT "6 Clicks" Daily Activity  Outcome Measure Difficulty turning over in bed (including adjusting bedclothes, sheets and blankets)?: A Little Difficulty moving from lying on back to sitting on the side of the bed? : A Little Difficulty sitting down on and standing up from a chair with arms (e.g., wheelchair, bedside commode, etc,.)?: A Little Help needed moving to and from a bed to chair (including a wheelchair)?: A  Little Help needed walking in hospital room?: A Little Help needed climbing 3-5 steps with a railing? : A Little 6 Click Score: 18    End of Session Equipment Utilized During Treatment: Gait belt;Oxygen Activity Tolerance: Patient tolerated treatment well Patient left: in bed;with call bell/phone within reach;with bed alarm set Nurse Communication: Mobility status PT Visit Diagnosis: Unsteadiness on feet (R26.81);Repeated falls (R29.6);History of falling (Z91.81)    Time: 1310-1345 PT Time Calculation (min) (ACUTE ONLY): 35 min   Charges:   PT Evaluation $PT Eval Moderate Complexity: 1 Mod PT Treatments $Gait Training: 8-22 mins        Ramond Dial 08/18/2018, 2:19 PM   Mee Hives, PT MS Acute Rehab Dept. Number: [place] and [place]

## 2018-08-18 NOTE — Progress Notes (Addendum)
MEDICATION RELATED CONSULT NOTE - INITIAL   Pharmacy Consult for electrolyte management Indication: hypokalemia  No Known Allergies  Patient Measurements: Height: 5\' 4"  (162.6 cm) Weight: 124 lb (56.2 kg) IBW/kg (Calculated) : 54.7 Adjusted Body Weight:   Vital Signs: Temp: 97.9 F (36.6 C) (09/29 0457) Temp Source: Oral (09/29 0457) BP: 181/82 (09/29 0457) Pulse Rate: 83 (09/29 0457) Intake/Output from previous day: 09/28 0701 - 09/29 0700 In: 147.9 [I.V.:147.9] Out: -  Intake/Output from this shift: No intake/output data recorded.  Labs: Recent Labs    08/17/18 1938 08/18/18 0406  WBC 8.8 7.3  HGB 13.4 12.5  HCT 38.8 35.7  PLT 290 302  CREATININE 0.70 0.82  ALBUMIN 2.9*  --   PROT 5.4*  --   AST 18  --   ALT 25  --   ALKPHOS 139*  --   BILITOT 0.4  --    Estimated Creatinine Clearance: 54.3 mL/min (by C-G formula based on SCr of 0.82 mg/dL).   Microbiology: No results found for this or any previous visit (from the past 720 hour(s)).  Medical History: Past Medical History:  Diagnosis Date  . Anxiety   . CAD (coronary artery disease)   . Carpal tunnel syndrome   . Cataract   . Complication of anesthesia    has woken up at times  . COPD (chronic obstructive pulmonary disease) ([place])   . CRP elevated 09/14/2015  . Depression   . Difficult intubation   . Dyslipidemia   . Dyspnea    DOE  . Dysrhythmia    hx palpatations  . Elevated sedimentation rate 09/14/2015  . Esophageal spasm   . Gastrointestinal parasites   . GERD (gastroesophageal reflux disease)   . Hiatal hernia   . History of peptic ulcer disease   . Hyperthyroidism   . Hypothyroidism   . Low magnesium levels 09/14/2015  . Pelvic fracture ([place]) 2008   fall from riding a horse  . Reflux   . Rotator cuff injury   . Stenosis, spinal, lumbar     Medications:  Infusions:  . cefTRIAXone (ROCEPHIN)  IV      Assessment: 72 yof cc SOB with PMH tobacco abuse, COPD, CAD,  anxiety/depression, frequent falls, lumbar stenosis and other comorbidities. She is being worked up for UTI and gait issues. Potassium noted to be low. Pharmacy consulted to manage electrolytes.  Goal of Therapy:  K 3.5 to 5 Ca 8.9 to 10.3 Mg 1.7 to 2.4 Phos 2.5 to 4.6  Plan:  Give potassium chloride 40 mEq po Q6H x 2 doses this morning. Check add-on magnesium and replete as needed. Recheck potassium tonight with evening labs. Recheck all electrolytes tomorrow with AM labs.  ADDENDUM: add-on magnesium returns as 1.7. Give magnesium sulfate 2 gm IV x 1. Recheck all electrolytes tomorrow with AM labs.   Laural Benes, Pharm.D., BCPS Clinical Pharmacist 08/18/2018,7:24 AM

## 2018-08-18 NOTE — H&P (Addendum)
[facility] [place] NAME: [name]    MR#:  [id number]  DATE OF BIRTH:  [birth year]  DATE OF ADMISSION:  08/17/2018  PRIMARY CARE PHYSICIAN: Albina Billet, MD   REQUESTING/REFERRING PHYSICIAN:   CHIEF COMPLAINT:   Chief Complaint  Patient presents with  . Shortness of Breath    HISTORY OF PRESENT ILLNESS: [name]  is a 72 y.o. female with a known history of tobacco abuse, COPD, CAD, anxiety/depression disorder, frequent falls, lumbar stenosis and other comorbidities. Patient presented to emergency room for shortness of breath, generalized weakness and lower back pain going on for the past 24 to 48 hours, gradually getting worse.  There is chronic productive cough secondary to smoking.  Patient has been out of her albuterol inhaler for the past few days.  EMS started her on DuoNebs with great improvement in her dyspnea.  No fever, chest pain, palpitations. Patient admits to frequent falls at home due to unstable gait.  Most recent fall happened 2 days ago.  She complains of lower back pain status post fall, no LOC.  Blood test done emergency room including CBC and CMP are grossly unremarkable except for low potassium level at 3.2.  UA is positive for UTI. No acute intracranial abnormality per CT of the brain.  Chest x-ray shows chronic emphysema but no acute changes. L-spine x-ray shows probable inferior endplate fracture involving L3 vertebral body. Patient is admitted for further evaluation and treatment.    PAST MEDICAL HISTORY:   Past Medical History:  Diagnosis Date  . Anxiety   . CAD (coronary artery disease)   . Carpal tunnel syndrome   . Cataract   . Complication of anesthesia    has woken up at times  . COPD (chronic obstructive pulmonary disease) ([place])   . CRP elevated 09/14/2015  . Depression   . Difficult intubation   . Dyslipidemia   . Dyspnea    DOE  . Dysrhythmia    hx palpatations  . Elevated  sedimentation rate 09/14/2015  . Esophageal spasm   . Gastrointestinal parasites   . GERD (gastroesophageal reflux disease)   . Hiatal hernia   . History of peptic ulcer disease   . Hyperthyroidism   . Hypothyroidism   . Low magnesium levels 09/14/2015  . Pelvic fracture ([place]) 2008   fall from riding a horse  . Reflux   . Rotator cuff injury   . Stenosis, spinal, lumbar     PAST SURGICAL HISTORY:  Past Surgical History:  Procedure Laterality Date  . APPENDECTOMY    . BACK SURGERY     CERVICAL FUSION  . CARPAL TUNNEL RELEASE    . CATARACT EXTRACTION W/PHACO Right 08/07/2017   Procedure: CATARACT EXTRACTION PHACO AND INTRAOCULAR LENS PLACEMENT (IOC);  Surgeon: Birder Robson, MD;  Location: [facility] ORS;  Service: Ophthalmology;  Laterality: Right;  [place] 00:52.0 AP% 16.8 CDE 8.74 Fluid Pack Lot # [id number]  . CATARACT EXTRACTION W/PHACO Left 09/04/2017   Procedure: CATARACT EXTRACTION PHACO AND INTRAOCULAR LENS PLACEMENT (IOC);  Surgeon: Birder Robson, MD;  Location: [facility] ORS;  Service: Ophthalmology;  Laterality: Left;  [place] 00:34 AP% 17.0 CDE 5.80 Fluid pack lot # [id number]  . CESAREAN SECTION    . CHOLECYSTECTOMY    . FRACTURE SURGERY    . HIP SURGERY    . INTRAMEDULLARY (IM) NAIL INTERTROCHANTERIC Right 10/01/2016   Procedure: INTRAMEDULLARY (IM) NAIL INTERTROCHANTRIC;  Surgeon: Corky Mull, MD;  Location:  [facility] ORS;  Service: Orthopedics;  Laterality: Right;  . NECK SURGERY    . NOSE SURGERY    . ROTATOR CUFF REPAIR     x2  . SHOULDER ARTHROSCOPY  12/07/2011   Procedure: ARTHROSCOPY SHOULDER;  Surgeon: Ninetta Lights, MD;  Location: [place];  Service: Orthopedics;  Laterality: Right;  Debridement Partial Cuff Tear, Release Coracoacromial Ligament  . SHOULDER SURGERY  12/07/2011   right    SOCIAL HISTORY:  Social History   Tobacco Use  . Smoking status: Current Every Day Smoker    Packs/day: 0.50    Types: Cigarettes  . Smokeless tobacco: Never  Used  Substance Use Topics  . Alcohol use: No    FAMILY HISTORY:  Family History  Problem Relation Age of Onset  . Aneurysm Unknown     DRUG ALLERGIES: No Known Allergies  REVIEW OF SYSTEMS:   CONSTITUTIONAL: No fever, but positive for fatigue and generalized weakness.  EYES: No changes in vision.  EARS, NOSE, AND THROAT: No tinnitus or ear pain.  RESPIRATORY: Positive for chronic productive cough.  Positive for worsening shortness of breath and wheezing.  No hemoptysis.  CARDIOVASCULAR: No chest pain, orthopnea, edema.  GASTROINTESTINAL: No nausea, vomiting, diarrhea or abdominal pain.  GENITOURINARY: No dysuria, hematuria.  ENDOCRINE: No polyuria, nocturia. HEMATOLOGY: No bleeding. SKIN: No rash or lesion. MUSCULOSKELETAL: Positive for acute on chronic lower back pain.   NEUROLOGIC: No focal weakness.  PSYCHIATRY: Positive history of anxiety/depression disorder.   MEDICATIONS AT HOME:  Prior to Admission medications   Medication Sig Start Date End Date Taking? Authorizing Provider  ALPRAZolam Duanne Moron) 1 MG tablet Take 1 tablet (1 mg total) by mouth 4 (four) times daily as needed. Patient taking differently: Take 1 mg by mouth 2 (two) times daily as needed.  10/04/16  Yes Epifanio Lesches, MD  buPROPion (WELLBUTRIN SR) 150 MG 12 hr tablet Take 150 mg by mouth daily.    Yes [provider]  Cholecalciferol (VITAMIN D3) 5000 units TABS Take 1 tablet (5,000 Units total) by mouth daily. 05/08/17  Yes Vevelyn Francois, NP  escitalopram (LEXAPRO) 10 MG tablet Take 10 mg by mouth daily.  10/15/13  Yes [provider]  Fluticasone-Salmeterol (ADVAIR) 100-50 MCG/DOSE AEPB Inhale 1 puff into the lungs every 12 (twelve) hours.   Yes [provider]  hydrochlorothiazide (MICROZIDE) 12.5 MG capsule Take 12.5 mg by mouth daily.   Yes [provider]  Ipratropium-Albuterol (COMBIVENT RESPIMAT) 20-100 MCG/ACT AERS respimat Inhale 1 puff into the lungs  every 6 (six) hours as needed for wheezing or shortness of breath.   Yes [provider]  levothyroxine (SYNTHROID, LEVOTHROID) 88 MCG tablet Take 88 mcg by mouth daily before breakfast.    Yes [provider]  magnesium oxide (MAG-OX) 400 MG tablet Take 1 tablet (400 mg total) by mouth 2 (two) times daily. 03/30/18 08/17/18 Yes Vevelyn Francois, NP  oxyCODONE (OXY IR/ROXICODONE) 5 MG immediate release tablet Take 1 tablet (5 mg total) by mouth every 6 (six) hours as needed for severe pain. 08/30/18 09/29/18 Yes Vevelyn Francois, NP  traZODone (DESYREL) 150 MG tablet Take 300 mg by mouth at bedtime.    Yes [provider]  oxyCODONE (OXY IR/ROXICODONE) 5 MG immediate release tablet Take 1 tablet (5 mg total) by mouth every 6 (six) hours as needed for severe pain. 07/01/18 07/31/18  Vevelyn Francois, NP  tizanidine (ZANAFLEX) 2 MG capsule Take 1 capsule (2 mg  total) by mouth 3 (three) times daily as needed for muscle spasms. 03/30/18 06/28/18  Vevelyn Francois, NP      PHYSICAL EXAMINATION:   VITAL SIGNS: Blood pressure (!) 153/89, pulse 90, temperature 97.8 F (36.6 C), temperature source Oral, resp. rate 14, height 5\' 4"  (1.626 m), weight 57.6 kg, SpO2 97 %.  GENERAL:  72 y.o.-year-old patient lying in the bed with moderate distress, secondary to back pain.  EYES: Pupils equal, round, reactive to light and accommodation. No scleral icterus. Extraocular muscles intact.  HEENT: Head atraumatic, normocephalic. Oropharynx and nasopharynx clear.  NECK:  Supple, no jugular venous distention. No thyroid enlargement, no tenderness.  LUNGS: Reduced breath sounds and scattered wheezing noted bilaterally. No use of accessory muscles of respiration.  CARDIOVASCULAR: S1, S2 normal. No S3/S4.  ABDOMEN: Soft, nontender, nondistended. Bowel sounds present. No organomegaly or mass.  EXTREMITIES: No pedal edema.  NEUROLOGIC: No focal weakness. MUSCULOSKELETAL: Severe generalized weakness is  noted.  There is reduced range of motion at L spine level, secondary to severe tenderness.  Unstable gait is noted. PSYCHIATRIC: The patient is alert and oriented x 3.  SKIN: Bruises are noted across the forehead, status post fall.  There is an abrasion on the right upper arm, with dressing on.  LABORATORY PANEL:   CBC Recent Labs  Lab 08/17/18 1938  WBC 8.8  HGB 13.4  HCT 38.8  PLT 290  MCV 91.9  MCH 31.7  MCHC 34.5  RDW 15.2*  LYMPHSABS 2.7  MONOABS 0.5  EOSABS 0.2  BASOSABS 0.1   ------------------------------------------------------------------------------------------------------------------  Chemistries  Recent Labs  Lab 08/17/18 1938  NA 139  K 3.2*  CL 109  CO2 23  GLUCOSE 94  BUN 8  CREATININE 0.70  CALCIUM 7.6*  AST 18  ALT 25  ALKPHOS 139*  BILITOT 0.4   ------------------------------------------------------------------------------------------------------------------ estimated creatinine clearance is 55.7 mL/min (by C-G formula based on SCr of 0.7 mg/dL). ------------------------------------------------------------------------------------------------------------------ No results for input(s): TSH, T4TOTAL, T3FREE, THYROIDAB in the last 72 hours.  Invalid input(s): FREET3   Coagulation profile No results for input(s): INR, PROTIME in the last 168 hours. ------------------------------------------------------------------------------------------------------------------- No results for input(s): DDIMER in the last 72 hours. -------------------------------------------------------------------------------------------------------------------  Cardiac Enzymes Recent Labs  Lab 08/17/18 1938  TROPONINI <0.03   ------------------------------------------------------------------------------------------------------------------ Invalid input(s):  POCBNP  ---------------------------------------------------------------------------------------------------------------  Urinalysis    Component Value Date/Time   COLORURINE AMBER (A) 08/17/2018 2212   APPEARANCEUR HAZY (A) 08/17/2018 2212   LABSPEC 1.033 (H) 08/17/2018 2212   PHURINE 5.0 08/17/2018 2212   GLUCOSEU NEGATIVE 08/17/2018 2212   HGBUR NEGATIVE 08/17/2018 2212   BILIRUBINUR SMALL (A) 08/17/2018 2212   KETONESUR NEGATIVE 08/17/2018 2212   PROTEINUR 30 (A) 08/17/2018 2212   NITRITE NEGATIVE 08/17/2018 2212   LEUKOCYTESUR MODERATE (A) 08/17/2018 2212     RADIOLOGY: Dg Chest 2 View  Result Date: 08/17/2018 CLINICAL DATA:  Dyspnea EXAM: CHEST - 2 VIEW COMPARISON:  10/01/2016 chest radiograph. FINDINGS: Surgical hardware from ACDF overlies the lower cervical spine. Stable cardiomediastinal silhouette with normal heart size. No pneumothorax. No pleural effusion. Lungs appear clear, with no acute consolidative airspace disease and no pulmonary edema. IMPRESSION: No active cardiopulmonary disease. Electronically Signed   By: Ilona Sorrel M.D.   On: 08/17/2018 20:45   Dg Lumbar Spine Complete  Result Date: 08/17/2018 CLINICAL DATA:  Low back pain after multiple falls. EXAM: LUMBAR SPINE - COMPLETE 4+ VIEW COMPARISON:  Radiographs of June 18, 2017.  MRI of June 18, 2017. FINDINGS:  There appears to be mild inferior endplate fracture involving the L3 vertebral body. Disc spaces are well-maintained. No spondylolisthesis is noted. Atherosclerosis of abdominal aorta is noted. IMPRESSION: Probable inferior endplate fracture involving L3 vertebral body. MRI is recommended for further evaluation. Aortic Atherosclerosis (ICD10-I70.0). Electronically Signed   By: Marijo Conception, M.D.   On: 08/17/2018 20:48   Dg Tibia/fibula Left  Result Date: 08/17/2018 CLINICAL DATA:  Fall, pain EXAM: LEFT TIBIA AND FIBULA - 2 VIEW COMPARISON:  None. FINDINGS: No fracture or dislocation is seen. The joint  spaces are preserved. Visualized soft tissues are within normal limits. No suprapatellar knee joint effusion. IMPRESSION: Negative. Electronically Signed   By: Julian Hy M.D.   On: 08/17/2018 23:21   Ct Head Wo Contrast  Result Date: 08/17/2018 CLINICAL DATA:  For head injury after multiple falls. EXAM: CT HEAD WITHOUT CONTRAST TECHNIQUE: Contiguous axial images were obtained from the base of the skull through the vertex without intravenous contrast. COMPARISON:  CT scan of October 01, 2016. FINDINGS: Brain: Mild chronic ischemic white matter disease is noted. No mass effect or midline shift is noted. Ventricular size is within normal limits. There is no evidence of mass lesion, hemorrhage or acute infarction. Vascular: No hyperdense vessel or unexpected calcification. Skull: Normal. Negative for fracture or focal lesion. Sinuses/Orbits: No acute finding. Other: None. IMPRESSION: Mild chronic ischemic white matter disease. No acute intracranial abnormality seen. Electronically Signed   By: Marijo Conception, M.D.   On: 08/17/2018 20:41    EKG: Orders placed or performed during the hospital encounter of 08/17/18  . ED EKG  . ED EKG  . EKG 12-Lead  . EKG 12-Lead  . EKG 12-Lead  . EKG 12-Lead    IMPRESSION AND PLAN:  1.  Acute UTI, will treat with Rocephin IV while waiting for urine culture result. 2.  Unstable gait, secondary to aging and osteoarthritis.  PT, OT consulted to evaluate and treat the patient. 3.  Frequent falls, secondary to unstable gait and chronic back pain. PT, OT consulted to evaluate and treat the patient. 4.  Acute L3 fracture.  Continue pain control.  Orthopedics, PT and OT are consulted for further evaluation and treatment. 5.  Acute COPD exacerbation, improved with nebulizer treatment.  Continue maintenance therapy.  Smoking cessation recommended. 6.  Tobacco abuse.  Smoking cessation discussed with patient in detail. 7.  Hypokalemia.  Will replace potassium per  protocol.  Patient lives by herself and she has a hard time taking care of herself.  She might be a good candidate for PACE program.  We will have social worker make further recommendations.  All the records are reviewed and case discussed with ED provider. Management plans discussed with the patient, family and they are in agreement.  CODE STATUS: Full Code Status History    Date Active Date Inactive Code Status Order ID Comments User Context   10/01/2016 1138 10/04/2016 1744 Full Code [id number]  Fritzi Mandes, MD Inpatient    Advance Directive Documentation     Most Recent Value  Type of Advance Directive  Living will  Pre-existing out of facility DNR order (yellow form or pink MOST form)  -  "MOST" Form in Place?  -       TOTAL TIME TAKING CARE OF THIS PATIENT: 50 minutes.    Amelia Jo M.D on 08/18/2018 at 1:17 AM  Between 7am to 6pm - Pager - [phone]  After 6pm go to [email or web address] - [place]  Mun[facility] at [facility]  [phone]  CC: Primary care physician; Albina Billet, MD

## 2018-08-18 NOTE — Consult Note (Signed)
[facility] Nurse wound consult note Reason for Consult: Full thickness tissue loss to right upper extremity Wound type: trauma Pressure Injury POA: NA Measurement: 3.8cm x 2.6cm x 0.4cm Wound bed: muscle exposed, other subcutaneous structures Drainage (amount, consistency, odor) small amount serous to light yellow exudate Periwound: intact, dry Dressing procedure/placement/frequency: I will implement a twice daily conservative POC consisting of cleansing and placement of an antimicrobial dressing with atraumatic removal (xeroform) topped with dry gauze and secured with a few turns of Kerlix roll gauze/paper tape.    Due to depth of injury, suggest that surgery (either general or plastic) physician be consulted for tissue repair and regeneration strategies.  If you agree, please order.  [facility] nursing team will not follow, but will remain available to this patient, the nursing and medical teams.  Please re-consult if needed. Thanks, Maudie Flakes, MSN, RN, [place], Arther Abbott  Pager# [phone]

## 2018-08-18 NOTE — Progress Notes (Signed)
Family Meeting Note  Advance Directive:no  Today a meeting took place with the Patient.  The following clinical team members were present during this meeting:MD  The following were discussed:Patient's diagnosis: compression fx uti, Patient's progosis: > 12 months and Goals for treatment: Full Code  Additional follow-up to be provided: chaplain consult to create advanced directives  Time spent during discussion:16 minutes  Alison Fernandez, MD

## 2018-08-18 NOTE — ED Notes (Signed)
Patient returned from MRI. Alert and oriented. Denies need for anything at this time.

## 2018-08-19 DIAGNOSIS — S32030A Wedge compression fracture of third lumbar vertebra, initial encounter for closed fracture: Secondary | ICD-10-CM | POA: Insufficient documentation

## 2018-08-19 LAB — CBC
HCT: 36.1 % (ref 35.0–47.0)
HEMOGLOBIN: 12.4 g/dL (ref 12.0–16.0)
MCH: 31.3 pg (ref 26.0–34.0)
MCHC: 34.2 g/dL (ref 32.0–36.0)
MCV: 91.5 fL (ref 80.0–100.0)
PLATELETS: 303 10*3/uL (ref 150–440)
RBC: 3.95 MIL/uL (ref 3.80–5.20)
RDW: 15 % — ABNORMAL HIGH (ref 11.5–14.5)
WBC: 6.7 10*3/uL (ref 3.6–11.0)

## 2018-08-19 LAB — PHOSPHORUS: Phosphorus: 2.8 mg/dL (ref 2.5–4.6)

## 2018-08-19 LAB — BASIC METABOLIC PANEL
Anion gap: 6 (ref 5–15)
BUN: 9 mg/dL (ref 8–23)
CHLORIDE: 105 mmol/L (ref 98–111)
CO2: 28 mmol/L (ref 22–32)
CREATININE: 0.65 mg/dL (ref 0.44–1.00)
Calcium: 8.4 mg/dL — ABNORMAL LOW (ref 8.9–10.3)
Glucose, Bld: 107 mg/dL — ABNORMAL HIGH (ref 70–99)
POTASSIUM: 3.9 mmol/L (ref 3.5–5.1)
Sodium: 139 mmol/L (ref 135–145)

## 2018-08-19 LAB — GLUCOSE, CAPILLARY: GLUCOSE-CAPILLARY: 103 mg/dL — AB (ref 70–99)

## 2018-08-19 LAB — MAGNESIUM: Magnesium: 1.9 mg/dL (ref 1.7–2.4)

## 2018-08-19 MED ORDER — CEFAZOLIN SODIUM-DEXTROSE 1-4 GM/50ML-% IV SOLN
1.0000 g | Freq: Once | INTRAVENOUS | Status: DC
  Filled 2018-08-19 (×2): qty 50

## 2018-08-19 MED ORDER — CEFAZOLIN (ANCEF) 1 G IV SOLR: 1.0000 g | INTRAVENOUS | Status: DC

## 2018-08-19 MED ORDER — POLYETHYLENE GLYCOL 3350 17 G PO PACK
17.0000 g | PACK | Freq: Every day | ORAL | Status: DC
  Administered 2018-08-19 – 2018-08-21 (×3): 17 g via ORAL
  Filled 2018-08-19 (×3): qty 1

## 2018-08-19 MED ORDER — GUAIFENESIN ER 600 MG PO TB12
600.0000 mg | ORAL_TABLET | Freq: Two times a day (BID) | ORAL | Status: DC
  Administered 2018-08-19 – 2018-08-21 (×4): 600 mg via ORAL
  Filled 2018-08-19 (×4): qty 1

## 2018-08-19 MED ORDER — IBUPROFEN 400 MG PO TABS
400.0000 mg | ORAL_TABLET | ORAL | Status: AC
  Administered 2018-08-19: 400 mg via ORAL
  Filled 2018-08-19: qty 1

## 2018-08-19 MED ORDER — CEFAZOLIN SODIUM-DEXTROSE 1-4 GM/50ML-% IV SOLN
1.0000 g | INTRAVENOUS | Status: AC
  Administered 2018-08-20: 1 g via INTRAVENOUS
  Filled 2018-08-19: qty 50

## 2018-08-19 NOTE — Progress Notes (Signed)
Subjective: The patient notes little change in her symptoms as compared to yesterday.  She did have difficulty with increased pain upon standing and ambulation.  The patient is ready to consider alternative treatment options.   Objective: Vital signs in last 24 hours: Temp:  [97.9 F (36.6 C)-98.3 F (36.8 C)] 98.1 F (36.7 C) (09/30 0644) Pulse Rate:  [72-92] 72 (09/30 1156) Resp:  [17-18] 17 (09/30 0644) BP: (158-166)/(79-92) 159/85 (09/30 0644) SpO2:  [95 %-98 %] 97 % (09/30 1156)  Intake/Output from previous day: 09/29 0701 - 09/30 0700 In: 151.2 [I.V.:10.1; IV Piggyback:141.1] Out: -  Intake/Output this shift: No intake/output data recorded.  Recent Labs    08/17/18 1938 08/18/18 0406 08/19/18 0329  HGB 13.4 12.5 12.4   Recent Labs    08/18/18 0406 08/19/18 0329  WBC 7.3 6.7  RBC 3.92 3.95  HCT 35.7 36.1  PLT 302 303   Recent Labs    08/18/18 0406 08/18/18 1752 08/19/18 0329  NA 142  --  139  K 2.8* 3.6 3.9  CL 104  --  105  CO2 30  --  28  BUN 12  --  9  CREATININE 0.82  --  0.65  GLUCOSE 138*  --  107*  CALCIUM 8.3*  --  8.4*   No results for input(s): LABPT, INR in the last 72 hours.  Physical Exam: Examination of the back and lower extremities is unchanged as compared to yesterday.  She remains neurovascularly intact to both lower extremities.  Assessment: Acute L3 compression fracture.  Plan: The patient notes continued significant pain in her back, especially with standing or ambulation.  She would like to discuss the possibility of a kyphoplasty with Dr. Rudene Christians.  Therefore, I will ask Dr. Rudene Christians to see this patient at some point today to discuss the procedure with her and determine if she might be a candidate for the kyphoplasty.  Meanwhile, it is reasonable to continue to mobilize her with physical therapy as tolerated, and to continue to provide appropriate pain medication as indicated.   Marshall Cork Jomari Bartnik 08/19/2018, 2:16 PM

## 2018-08-19 NOTE — Progress Notes (Signed)
MEDICATION RELATED CONSULT NOTE - INITIAL   Pharmacy Consult for electrolyte management Indication: hypokalemia  No Known Allergies  Patient Measurements: Height: 5\' 4"  (162.6 cm) Weight: 124 lb (56.2 kg) IBW/kg (Calculated) : 54.7 Adjusted Body Weight:   Vital Signs: Temp: 97.9 F (36.6 C) (09/29 1914) Temp Source: Oral (09/29 1914) BP: 158/79 (09/29 1914) Pulse Rate: 92 (09/29 1914) Intake/Output from previous day: 09/29 0701 - 09/30 0700 In: 151.2 [I.V.:10.1; IV Piggyback:141.1] Out: -  Intake/Output from this shift: Total I/O In: 110.1 [I.V.:10.1; IV Piggyback:100] Out: -   Labs: Recent Labs    08/17/18 1938 08/18/18 0406 08/19/18 0329  WBC 8.8 7.3 6.7  HGB 13.4 12.5 12.4  HCT 38.8 35.7 36.1  PLT 290 302 303  CREATININE 0.70 0.82 0.65  MG  --  1.7 1.9  PHOS  --   --  2.8  ALBUMIN 2.9*  --   --   PROT 5.4*  --   --   AST 18  --   --   ALT 25  --   --   ALKPHOS 139*  --   --   BILITOT 0.4  --   --    Estimated Creatinine Clearance: 55.7 mL/min (by C-G formula based on SCr of 0.65 mg/dL).   Microbiology: No results found for this or any previous visit (from the past 720 hour(s)).  Medical History: Past Medical History:  Diagnosis Date  . Anxiety   . CAD (coronary artery disease)   . Carpal tunnel syndrome   . Cataract   . Complication of anesthesia    has woken up at times  . COPD (chronic obstructive pulmonary disease) ([place])   . CRP elevated 09/14/2015  . Depression   . Difficult intubation   . Dyslipidemia   . Dyspnea    DOE  . Dysrhythmia    hx palpatations  . Elevated sedimentation rate 09/14/2015  . Esophageal spasm   . Gastrointestinal parasites   . GERD (gastroesophageal reflux disease)   . Hiatal hernia   . History of peptic ulcer disease   . Hyperthyroidism   . Hypothyroidism   . Low magnesium levels 09/14/2015  . Pelvic fracture ([place]) 2008   fall from riding a horse  . Reflux   . Rotator cuff injury   . Stenosis, spinal,  lumbar     Medications:  Infusions:  . sodium chloride Stopped (08/18/18 2223)  . cefTRIAXone (ROCEPHIN)  IV Stopped (08/18/18 2124)    Assessment: 24 yof cc SOB with PMH tobacco abuse, COPD, CAD, anxiety/depression, frequent falls, lumbar stenosis and other comorbidities. She is being worked up for UTI and gait issues. Potassium noted to be low. Pharmacy consulted to manage electrolytes.  Goal of Therapy:  K 3.5 to 5 Ca 8.9 to 10.3 Mg 1.7 to 2.4 Phos 2.5 to 4.6  Plan:  09/30 @ 0500 electrolytes WNL no replacement at this time, will recheck w/ am labs.  Tobie Lords, Pharm.D Clinical Pharmacist 08/19/2018,5:33 AM

## 2018-08-19 NOTE — Progress Notes (Signed)
   08/19/18 1245  Clinical Encounter Type  Visited With Patient  Visit Type Initial;Spiritual support  Referral From Nurse  Consult/Referral To Chaplain  Spiritual Encounters  Spiritual Needs Prayer;Emotional   [place] received an OR to visit with [name]. When I entered the room the patient was sitting up in the recliner in her room. She was in good spirits waiting for her lunch to be brought in. [name] told of a fall she had recently at home that has caused her to have a bruise on her face and a injury to her back. I provided active listening and pastoral presences. I encouraged [name] to have the [facility] paged if needed.

## 2018-08-19 NOTE — Progress Notes (Signed)
Physical Therapy Treatment Patient Details Name: [name] MRN: [id number] DOB: [birth year] Today's Date: 08/19/2018    History of Present Illness 72 yo female with onset of fall and subsequent admission for UTI was noted to have L3 compression fracture.  Per nsg pt typically takes a great deal of meds at home, has been asking for a lot of doses here.  PMHx:  anxiety, CAD, CTS, anesthesia complications (waking up), COPD, depression, esophageal spasm, pelvic fracture, PUD, rotator cuff injury, spinal stenosis.    PT Comments    Pt agreeable to PT; reports pain in LB 7/10. MD in during session assessing pain; pt now scheduled for kyphoplasty procedure 08/20/18 at 1300. Pt requires Min guard for STS transfers and for ambulation with rolling walker with education on proper body/walker positioning; increased assist needed without rolling walker. Pt participates in seated exercises and receives up in chair. O2 saturation/HR monitored throughout session on room air. 94% with seated activities and 90% post 80 ft ambulation with HR 128 bpm. Recovery to 94% and HR to 89 within 3 minutes seated rest.    Follow Up Recommendations   TBD post procedure      Equipment Recommendations       Recommendations for Other Services       Precautions / Restrictions Precautions Precautions: Fall Precaution Comments: O2 sats on room air with seated activity 94-94%; 90% with 80 ft ambulation and HR 128. Recovered to 94% and HR 89 within 3 minutes.  Restrictions Weight Bearing Restrictions: No    Mobility  Bed Mobility Overal bed mobility: Modified Independent             General bed mobility comments: after initial instruction in log roll techniques to minimize back pain with cues to implement as pt was mildly impulsive to sit  Transfers Overall transfer level: Needs assistance Equipment used: Rolling walker (2 wheeled) Transfers: Sit to/from Stand Sit to Stand: Min guard         General  transfer comment: Mild effort/pain. Performed several times  Ambulation/Gait Ambulation/Gait assistance: Min guard Gait Distance (Feet): 80 Feet(15 ft HHA bed to chair) Assistive device: Rolling walker (2 wheeled) Gait Pattern/deviations: Step-through pattern   Gait velocity interpretation: <1.8 ft/sec, indicate of risk for recurrent falls General Gait Details: Instruction for maintaining proper rw distance in regards to body. Mild decrease in cadence/fluidity. Short ambulation (15') HHA with greater unsteadiness requiring Min A   Stairs             Wheelchair Mobility    Modified Rankin (Stroke Patients Only)       Balance Overall balance assessment: History of Falls;Needs assistance Sitting-balance support: Feet supported;Bilateral upper extremity supported Sitting balance-Leahy Scale: Good Sitting balance - Comments: pt occasionally using BUE for support likely 2/2 back pain and hip pain with prolonged sitting   Standing balance support: Bilateral upper extremity supported Standing balance-Leahy Scale: Fair Standing balance comment: required B hands on RW for support                            Cognition Arousal/Alertness: Awake/alert Behavior During Therapy: WFL for tasks assessed/performed Overall Cognitive Status: Within Functional Limits for tasks assessed                                 General Comments: Pt answer questions/discusses concerns; does have tendency to speak off on  tangents requiring re direction back to task/concerns at hand      Exercises General Exercises - Lower Extremity Long Arc Quad: AROM;Both;20 reps;Seated Hip Flexion/Marching: AROM;Both;20 reps;Seated Toe Raises: AROM;Both;20 reps;Seated Heel Raises: AROM;Both;20 reps;Seated Other Exercises Other Exercises: Pt educated in self mgt of hydration and role in falls risk and bladder mgt; educated in adaptive strategies to support hydration and minimize dehydration  and associated health risks. Other Exercises: Pt educated in use of weekly pill organizer to improve self mgt of medication and minimize unintentional noncompliance while minimizing impact of impaired memory.  Other Exercises: Pt instructed in use of AE for LB dressing and housekeeping tasks to minimize back pain and risk of falls from LOB. Other Exercises: Pt educated in falls prevention strategies including use of anti-skid all weather tape for use on wooden steps to maximize visual discrimination of each step and decrease risk of falls. Other Exercises: Pt educated in role of chaplain services to support pt with current emotional/mental health. Pt agreeable to meeting with chaplain. RN notified.    General Comments General comments (skin integrity, edema, etc.): At start of session after bed mobility, HR 111, O2 sats on RA 92%. with STS transfer, HR up to 124 and O2 94%. After 5 side steps and sitting EOB HR 118 and O2 sats 91%. No SOB noted during session.      Pertinent Vitals/Pain Pain Assessment: 0-10 Pain Score: 7  Pain Location: low back Pain Descriptors / Indicators: Constant;Grimacing;Sharp Pain Intervention(s): Limited activity within patient's tolerance;Monitored during session;Repositioned;Other (comment)(MD assessed; kyphoplasty scheduled 10/1 @ 1300)    Home Living Family/patient expects to be discharged to:: Private residence Living Arrangements: Alone Available Help at Discharge: Available PRN/intermittently;Neighbor Type of Home: House Home Access: Stairs to enter Entrance Stairs-Rails: None Home Layout: One level Home Equipment: Environmental consultant - 2 wheels;Cane - single point;Shower seat      Prior Function Level of Independence: Independent      Comments: Pt denies recent use of AD, history of multiple falls - 10 in the past 12 months resulting in various fractures and injuries, indep with ADL and IADL but has difficulty with donning/tying shoes and bending over for ADL 2/2  back pain, pt minimizes nighttime driving 2/2 vision, and indep with caring for her dog. Neighbor assists with taking out trash and other housekeeping tasks. Per chart review, pt has been getting some paid assistance but has not recently over conflicts with caregivers   PT Goals (current goals can now be found in the care plan section) Acute Rehab PT Goals Patient Stated Goal: to go home today and see my dog and have less pain Progress towards PT goals: Progressing toward goals    Frequency    7X/week      PT Plan Current plan remains appropriate    Co-evaluation              AM-PAC PT "6 Clicks" Daily Activity  Outcome Measure  Difficulty turning over in bed (including adjusting bedclothes, sheets and blankets)?: A Little Difficulty moving from lying on back to sitting on the side of the bed? : A Little Difficulty sitting down on and standing up from a chair with arms (e.g., wheelchair, bedside commode, etc,.)?: Unable Help needed moving to and from a bed to chair (including a wheelchair)?: A Little Help needed walking in hospital room?: A Little Help needed climbing 3-5 steps with a railing? : A Lot 6 Click Score: 15    End of Session Equipment Utilized  During Treatment: Gait belt Activity Tolerance: Patient tolerated treatment well Patient left: in chair;with call bell/phone within reach;with chair alarm set   PT Visit Diagnosis: Unsteadiness on feet (R26.81);Repeated falls (R29.6);History of falling (Z91.81)     Time: 0174-9449 PT Time Calculation (min) (ACUTE ONLY): 31 min  Charges:  $Gait Training: 8-22 mins $Therapeutic Exercise: 8-22 mins                      Larae Grooms, PTA 08/19/2018, 1:24 PM

## 2018-08-19 NOTE — Evaluation (Signed)
Occupational Therapy Evaluation Patient Details Name: [name] MRN: [id number] DOB: 03/17/1946 Today's Date: 08/19/2018    History of Present Illness 72 yo female with onset of fall and subsequent admission for UTI was noted to have L3 compression fracture.  Per nsg pt typically takes a great deal of meds at home, has been asking for a lot of doses here.  PMHx:  anxiety, CAD, CTS, anesthesia complications (waking up), COPD, depression, esophageal spasm, pelvic fracture, PUD, rotator cuff injury, spinal stenosis.   Clinical Impression   Pt seen for OT evaluation this date. Prior to hospital admission, pt was living by herself and ambulating without AD. Indep with basic ADL but having increasing difficulty with LB dressing tasks. Pt generally does not drive at night, has a neighbor assisting with taking the trash out after recent fall. Multiple falls in past year.  Pt has 3 wooden steps to enter the home which she indicated were a common point of falling due to not seeing the edge of the steps. Pt endorses use of glasses to correct for double vision which she's had for approx 1 year, per pt report.  Currently pt demonstrates impairments in pain in her lower back and L hip, balance, strength, ROM deficits in B shoulders 2/2 old RTC injuries, mild dizziness at times, and impaired safety awareness, impulsive with movement, and unable to accurately follow simple commands requiring CGA for transfers and short distance mobility with RW and cues for safety/cognition, and min assist for LB ADL for safety. Pt instructed in log roll techniques for bed mobility, medication mgt strategies, functional transfer training, and falls prevention strategies. Pt educated in role of chaplain after pt stated "I have been so down I just don't want to to anything." Pt agreeable to seeing chaplain. RN notified. MD notified at end of session about pt's reported mood/mental health concerns and functional status. Pt would benefit  from skilled OT to address noted impairments and functional limitations (see below for any additional details) in order to maximize safety and independence while minimizing falls risk and caregiver burden. Pt with minimal support system to provide needed level of assist. Upon hospital discharge, recommend pt discharge to STR with 24/7 supervision/assist for OOB/mobility.      Follow Up Recommendations  SNF;Supervision/Assistance - 24 hour(sup for OOB/mobility and LB ADL)    Equipment Recommendations  Other (comment)(reacher)    Recommendations for Other Services       Precautions / Restrictions Precautions Precautions: Fall Precaution Comments: ck O2 sats and HR with activity Restrictions Weight Bearing Restrictions: No      Mobility Bed Mobility Overal bed mobility: Modified Independent             General bed mobility comments: after initial instruction in log roll techniques to minimize back pain with cues to implement as pt was mildly impulsive to sit  Transfers Overall transfer level: Needs assistance Equipment used: Rolling walker (2 wheeled) Transfers: Sit to/from Stand Sit to Stand: Min guard         General transfer comment: initial instruction in scooting EOB, feet behind her, hands pushing up from EOB to improve transfer technique and minimize pain    Balance Overall balance assessment: History of Falls;Needs assistance Sitting-balance support: Feet supported;Bilateral upper extremity supported Sitting balance-Leahy Scale: Fair Sitting balance - Comments: pt occasionally using BUE for support likely 2/2 back pain and hip pain with prolonged sitting   Standing balance support: Bilateral upper extremity supported;During functional activity Standing balance-Leahy Scale:  Poor Standing balance comment: required B hands on RW for support                           ADL either performed or assessed with clinical judgement   ADL Overall ADL's : Needs  assistance/impaired Eating/Feeding: Independent   Grooming: Sitting;Independent   Upper Body Bathing: Sitting;Supervision/ safety   Lower Body Bathing: Sit to/from stand;Minimal assistance;Cueing for safety   Upper Body Dressing : Sitting;Supervision/safety   Lower Body Dressing: Sit to/from stand;Minimal assistance;Cueing for safety   Toilet Transfer: RW;Ambulation;BSC;Min guard;Cueing for safety           Functional mobility during ADLs: Min guard;Rolling walker;Cueing for safety       Vision Baseline Vision/History: Wears glasses Wears Glasses: At all times Patient Visual Report: No change from baseline;Diplopia(pt notes double vision for past year that is being corrected by wearing eye glasses ) Vision Assessment?: Vision impaired- to be further tested in functional context Additional Comments: impaired at baseline, continue to assess in functional context      Perception     Praxis      Pertinent Vitals/Pain Pain Assessment: 0-10 Pain Score: 7  Pain Location: 7/10 low back pain at rest, 6-7/10 after bed mobility and transfers once back in bed Pain Descriptors / Indicators: Grimacing Pain Intervention(s): Limited activity within patient's tolerance;Monitored during session;Repositioned;Patient requesting pain meds-RN notified     Hand Dominance Right   Extremity/Trunk Assessment Upper Extremity Assessment Upper Extremity Assessment: Generalized weakness(bilateral old RTC injuries impairing shoulder ROM, otherwise at least 4-/5 bilaterally)   Lower Extremity Assessment Lower Extremity Assessment: Overall WFL for tasks assessed(grossly [facility] bilaterally)   Cervical / Trunk Assessment Cervical / Trunk Assessment: Kyphotic   Communication Communication Communication: HOH   Cognition Arousal/Alertness: Awake/alert Behavior During Therapy: Impulsive Overall Cognitive Status: No family/caregiver present to determine baseline cognitive functioning                                  General Comments: Pt alert and oriented, difficulty noted with following commands and difficulty with sustained attention requiring cues to redirect, decreased STM per pt and as evidenced during session requiring cues for recall, decreased safety awareness and mildly impulsive with movement   General Comments  At start of session after bed mobility, HR 111, O2 sats on RA 92%. with STS transfer, HR up to 124 and O2 94%. After 5 side steps and sitting EOB HR 118 and O2 sats 91%. No SOB noted during session.    Exercises Other Exercises Other Exercises: Pt educated in self mgt of hydration and role in falls risk and bladder mgt; educated in adaptive strategies to support hydration and minimize dehydration and associated health risks. Other Exercises: Pt educated in use of weekly pill organizer to improve self mgt of medication and minimize unintentional noncompliance while minimizing impact of impaired memory.  Other Exercises: Pt instructed in use of AE for LB dressing and housekeeping tasks to minimize back pain and risk of falls from LOB. Other Exercises: Pt educated in falls prevention strategies including use of anti-skid all weather tape for use on wooden steps to maximize visual discrimination of each step and decrease risk of falls. Other Exercises: Pt educated in role of chaplain services to support pt with current emotional/mental health. Pt agreeable to meeting with chaplain. RN notified.   Shoulder Instructions  Home Living Family/patient expects to be discharged to:: Private residence Living Arrangements: Alone Available Help at Discharge: Available PRN/intermittently;Neighbor Type of Home: House Home Access: Stairs to enter [employer] of Steps: 3 wooden covered front outdoor steps and 3 covered wood steps in the garage Entrance Stairs-Rails: None Home Layout: One level         Biochemist, clinical: [place]: Environmental consultant  - 2 wheels;Cane - single point;Shower seat          Prior Functioning/Environment Level of Independence: Independent        Comments: Pt denies recent use of AD, history of multiple falls - 10 in the past 12 months resulting in various fractures and injuries, indep with ADL and IADL but has difficulty with donning/tying shoes and bending over for ADL 2/2 back pain, pt minimizes nighttime driving 2/2 vision, and indep with caring for her dog. Neighbor assists with taking out trash and other housekeeping tasks. Per chart review, pt has been getting some paid assistance but has not recently over conflicts with caregivers        OT Problem List: Decreased strength;Impaired vision/perception;Decreased knowledge of use of DME or AE;Decreased range of motion;Decreased activity tolerance;Decreased cognition;Cardiopulmonary status limiting activity;Impaired UE functional use;Pain;Decreased safety awareness;Impaired balance (sitting and/or standing)      OT Treatment/Interventions: Self-care/ADL training;Balance training;Therapeutic exercise;Therapeutic activities;Energy conservation;Cognitive remediation/compensation;DME and/or AE instruction;Patient/family education;Visual/perceptual remediation/compensation    OT Goals(Current goals can be found in the care plan section) Acute Rehab OT Goals Patient Stated Goal: to go home today and see my dog and have less pain OT Goal Formulation: With patient Time For Goal Achievement: 09/02/18 Potential to Achieve Goals: Good ADL Goals Pt Will Perform Lower Body Dressing: with modified independence;sit to/from stand;with adaptive equipment Pt Will Transfer to Toilet: with supervision;ambulating(LRAD for ambulation, comfort height/elevated toilet) Additional ADL Goal #1: Pt will be modified independent with medication mgt, utilizing compensatory strategies/techniques/tools to minimize risk of noncompliance, risk of falls, and risk of readmission. Additional  ADL Goal #2: Pt will verbalize plan to implement at least 1 learned falls prevention strategy to maximize safety and independence in the home.  OT Frequency: Min 2X/week   Barriers to D/C: Decreased caregiver support;Inaccessible home environment          Co-evaluation              AM-PAC PT "6 Clicks" Daily Activity     Outcome Measure Help from another person eating meals?: None Help from another person taking care of personal grooming?: None Help from another person toileting, which includes using toliet, bedpan, or urinal?: A Little Help from another person bathing (including washing, rinsing, drying)?: A Little Help from another person to put on and taking off regular upper body clothing?: A Little Help from another person to put on and taking off regular lower body clothing?: A Little 6 Click Score: 20   End of Session Equipment Utilized During Treatment: Gait belt;Rolling walker Nurse Communication: Other (comment);Patient requests pain meds(request for chaplain services)  Activity Tolerance: Patient tolerated treatment well Patient left: in bed;with call bell/phone within reach;with bed alarm set  OT Visit Diagnosis: Other abnormalities of gait and mobility (R26.89);Repeated falls (R29.6);Muscle weakness (generalized) (M62.81);Pain;Other symptoms and signs involving cognitive function Pain - Right/Left: Left Pain - part of body: Hip(lumbar spine)                Time: 5400-8676 OT Time Calculation (min): 57 min Charges:  OT General Charges $OT  Visit: 1 Visit OT Evaluation $OT Eval Moderate Complexity: 1 Mod OT Treatments $Self Care/Home Management : 38-52 mins  Jeni Salles, MPH, MS, OTR/L ascom [phone] 08/19/18, 11:41 AM

## 2018-08-19 NOTE — Progress Notes (Signed)
Patient is seen for potential kyphoplasty.  She had MRI that shows L3 compression fracture with minimal deformity but bone edema present.  On exam she has significant tenderness to percussion at the appropriate level.  She has no neuro deficit.  Based on exam and her desire for pain relief its limiting her ability to ambulate and to go home she would like to proceed with kyphoplasty after explanation of the procedure.  We will plan on this tomorrow, heparin has been discontinued for preop status

## 2018-08-19 NOTE — Progress Notes (Signed)
[facility] [place] NAME: Jodee Wagenaar    MR#:  [id number]  DATE OF BIRTH:  July 05, 1946  SUBJECTIVE:  CHIEF COMPLAINT:   Chief Complaint  Patient presents with  . Shortness of Breath   - complains of back pain, wants to go home and not rehab if possible - very emotional today  REVIEW OF SYSTEMS:  Review of Systems  Constitutional: Negative for chills, fever and malaise/fatigue.  HENT: Negative for ear discharge, hearing loss and nosebleeds.   Eyes: Negative for blurred vision and double vision.  Respiratory: Negative for cough, shortness of breath and wheezing.   Cardiovascular: Negative for chest pain and palpitations.  Gastrointestinal: Negative for abdominal pain, constipation, diarrhea, nausea and vomiting.  Genitourinary: Negative for dysuria.  Musculoskeletal: Positive for back pain and myalgias.  Neurological: Negative for dizziness, seizures and headaches.  Psychiatric/Behavioral: Positive for depression.    DRUG ALLERGIES:  No Known Allergies  VITALS:  Blood pressure (!) 159/85, pulse 72, temperature 98.1 F (36.7 C), temperature source Oral, resp. rate 17, height 5\' 4"  (1.626 m), weight 56.2 kg, SpO2 97 %.  PHYSICAL EXAMINATION:  Physical Exam   GENERAL:  72 y.o.-year-old patient lying in the bed with no acute distress.  EYES: Pupils equal, round, reactive to light and accommodation. No scleral icterus. Extraocular muscles intact.  HEENT: Head atraumatic, normocephalic. Oropharynx and nasopharynx clear.  NECK:  Supple, no jugular venous distention. No thyroid enlargement, no tenderness.  LUNGS: Normal breath sounds bilaterally, no wheezing, rales,rhonchi or crepitation. No use of accessory muscles of respiration. Decreased bibasilar breath sounds noted. CARDIOVASCULAR: S1, S2 normal. No  rubs, or gallops. 2/6 systolic murmur present ABDOMEN: Soft, nontender, nondistended. Bowel sounds present. No organomegaly or mass.    EXTREMITIES: No pedal edema, cyanosis, or clubbing.  NEUROLOGIC: Cranial nerves II through XII are intact. Muscle strength 5/5 in all extremities. Sensation intact. Gait not checked.  PSYCHIATRIC: The patient is alert and oriented x 3. Depressed and low mood today, not suicidal SKIN: No obvious rash, lesion, or ulcer.    LABORATORY PANEL:   CBC Recent Labs  Lab 08/19/18 0329  WBC 6.7  HGB 12.4  HCT 36.1  PLT 303   ------------------------------------------------------------------------------------------------------------------  Chemistries  Recent Labs  Lab 08/17/18 1938  08/19/18 0329  NA 139   < > 139  K 3.2*   < > 3.9  CL 109   < > 105  CO2 23   < > 28  GLUCOSE 94   < > 107*  BUN 8   < > 9  CREATININE 0.70   < > 0.65  CALCIUM 7.6*   < > 8.4*  MG  --    < > 1.9  AST 18  --   --   ALT 25  --   --   ALKPHOS 139*  --   --   BILITOT 0.4  --   --    < > = values in this interval not displayed.   ------------------------------------------------------------------------------------------------------------------  Cardiac Enzymes Recent Labs  Lab 08/17/18 1938  TROPONINI <0.03   ------------------------------------------------------------------------------------------------------------------  RADIOLOGY:  Dg Chest 2 View  Result Date: 08/17/2018 CLINICAL DATA:  Dyspnea EXAM: CHEST - 2 VIEW COMPARISON:  10/01/2016 chest radiograph. FINDINGS: Surgical hardware from ACDF overlies the lower cervical spine. Stable cardiomediastinal silhouette with normal heart size. No pneumothorax. No pleural effusion. Lungs appear clear, with no acute consolidative airspace disease and no pulmonary edema. IMPRESSION: No active  cardiopulmonary disease. Electronically Signed   By: Ilona Sorrel M.D.   On: 08/17/2018 20:45   Dg Lumbar Spine Complete  Result Date: 08/17/2018 CLINICAL DATA:  Low back pain after multiple falls. EXAM: LUMBAR SPINE - COMPLETE 4+ VIEW COMPARISON:  Radiographs of  June 18, 2017.  MRI of June 18, 2017. FINDINGS: There appears to be mild inferior endplate fracture involving the L3 vertebral body. Disc spaces are well-maintained. No spondylolisthesis is noted. Atherosclerosis of abdominal aorta is noted. IMPRESSION: Probable inferior endplate fracture involving L3 vertebral body. MRI is recommended for further evaluation. Aortic Atherosclerosis (ICD10-I70.0). Electronically Signed   By: Marijo Conception, M.D.   On: 08/17/2018 20:48   Dg Tibia/fibula Left  Result Date: 08/17/2018 CLINICAL DATA:  Fall, pain EXAM: LEFT TIBIA AND FIBULA - 2 VIEW COMPARISON:  None. FINDINGS: No fracture or dislocation is seen. The joint spaces are preserved. Visualized soft tissues are within normal limits. No suprapatellar knee joint effusion. IMPRESSION: Negative. Electronically Signed   By: Julian Hy M.D.   On: 08/17/2018 23:21   Ct Head Wo Contrast  Result Date: 08/17/2018 CLINICAL DATA:  For head injury after multiple falls. EXAM: CT HEAD WITHOUT CONTRAST TECHNIQUE: Contiguous axial images were obtained from the base of the skull through the vertex without intravenous contrast. COMPARISON:  CT scan of October 01, 2016. FINDINGS: Brain: Mild chronic ischemic white matter disease is noted. No mass effect or midline shift is noted. Ventricular size is within normal limits. There is no evidence of mass lesion, hemorrhage or acute infarction. Vascular: No hyperdense vessel or unexpected calcification. Skull: Normal. Negative for fracture or focal lesion. Sinuses/Orbits: No acute finding. Other: None. IMPRESSION: Mild chronic ischemic white matter disease. No acute intracranial abnormality seen. Electronically Signed   By: Marijo Conception, M.D.   On: 08/17/2018 20:41   Mr Lumbar Spine Wo Contrast  Result Date: 08/18/2018 CLINICAL DATA:  Low back pain after multiple falls. Follow-up lumbar spine fracture. EXAM: MRI LUMBAR SPINE WITHOUT CONTRAST TECHNIQUE: Multiplanar, multisequence  MR imaging of the lumbar spine was performed. No intravenous contrast was administered. COMPARISON:  Lumbar spine radiographs August 09, 2018 and MRI of the lumbar spine June 18, 2017 FINDINGS: SEGMENTATION: For the purposes of this report, the last well-formed intervertebral disc is reported as L5-S1. ALIGNMENT: Maintained lumbar lordosis. Grade 1 L3-4 anterolisthesis. No spondylolysis. VERTEBRAE:Acute L3 inferior endplate fracture with approximately 30% height loss. Old mild T12 compression fracture or Schmorl's node. Old moderate L5 compression fracture with 30-50% height loss. Mild subacute discogenic endplate changes T0-1. Generally bright T1 bone marrow signal compatible with osteopenia. CONUS MEDULLARIS AND CAUDA EQUINA: Conus medullaris terminates at T12 and demonstrates normal morphology and signal characteristics. Cauda equina is normal. PARASPINAL AND OTHER SOFT TISSUES: Nonacute. 3 cm T2 bright cyst LEFT kidney. DISC LEVELS: T12-L1 and L1-2: No disc bulge, canal stenosis nor neural foraminal narrowing. L2-3: Similar moderate RIGHT extraforaminal disc protrusion encroaching upon the exited RIGHT L2 nerve. No canal stenosis. No neural foraminal narrowing. L3-4: Anterolisthesis. Annular bulging. Moderate facet arthropathy and ligamentum flavum redundancy. 11 mm RIGHT and 5 mm LEFT facet synovial cysts within paraspinal soft tissues. 4 mm LEFT facet synovial cyst within LEFT extraforaminal soft tissues. Moderate canal stenosis. Moderate RIGHT and moderate to severe LEFT neural foraminal narrowing. L4-5: Similar moderate central disc protrusion. Moderate facet arthropathy and ligamentum flavum redundancy. Moderate canal stenosis. Mild-to-moderate LEFT neural foraminal narrowing. L5-S1: Transitional anatomy. No disc bulge, canal stenosis nor neural foraminal narrowing. IMPRESSION:  1. Acute mild-to-moderate L3 inferior endplate compression fracture. Old T12 and L5 compression fractures. Osteopenia. 2. Grade  1 L3-4 anterolisthesis without spondylolysis. 3. Moderate canal stenosis L3-4 and L4-5. Neural foraminal narrowing L3-4 and L4-5: Moderate to severe on the LEFT at L3-4. Electronically Signed   By: Elon Alas M.D.   On: 08/18/2018 01:38    EKG:   Orders placed or performed during the hospital encounter of 08/17/18  . ED EKG  . ED EKG  . EKG 12-Lead  . EKG 12-Lead  . EKG 12-Lead  . EKG 12-Lead    ASSESSMENT AND PLAN:   72 year old female with past medical history significant for COPD, smoking, CAD, anxiety and depression, lumbar stenosis presents to hospital from home secondary to fall and noted to have L3 compression fracture.  1.  L3 compression fracture with severe back pain-continue pain medications -Appreciate orthopedics consult -Likely kyphoplasty tomorrow. -Physical therapy to reevaluate after her procedure.  2.  Hypokalemia- replaced  3.  UTI-unfortunately cultures were not sent -Continue Rocephin for now.  4.  Hypertension-Norvasc  5.  Depression-feeling low, but not suicidal or homicidal. -Continue Wellbutrin and Lexapro  6.  DVT prophylaxis-heparin held due to surgery tomorrow     All the records are reviewed and case discussed with Care Management/Social Workerr. Management plans discussed with the patient, family and they are in agreement.  CODE STATUS: Full Code  TOTAL TIME TAKING CARE OF THIS PATIENT: 37 minutes.   POSSIBLE D/C IN 2 DAYS, DEPENDING ON CLINICAL CONDITION.   Sahira Cataldi M.D on 08/19/2018 at 1:46 PM  Between 7am to 6pm - Pager - [phone]  After 6pm go to [email or web address] - password [email or web address] [facility]  Office  [phone]  CC: Primary care physician; Albina Billet, MD

## 2018-08-20 ENCOUNTER — Inpatient Hospital Stay: Payer: Medicare HMO | Admitting: Anesthesiology

## 2018-08-20 ENCOUNTER — Encounter
Admission: EM | Disposition: A | Discharge status: Home Health | Payer: Self-pay | Source: Home / Self Care | Attending: Internal Medicine

## 2018-08-20 ENCOUNTER — Inpatient Hospital Stay: Payer: Medicare HMO

## 2018-08-20 HISTORY — PX: KYPHOPLASTY: SHX5884

## 2018-08-20 LAB — RENAL FUNCTION PANEL
ALBUMIN: 3.5 g/dL (ref 3.5–5.0)
Anion gap: 10 (ref 5–15)
BUN: 10 mg/dL (ref 8–23)
CO2: 24 mmol/L (ref 22–32)
Calcium: 9 mg/dL (ref 8.9–10.3)
Chloride: 107 mmol/L (ref 98–111)
Creatinine, Ser: 0.63 mg/dL (ref 0.44–1.00)
GFR calc Af Amer: >60 mL/min (ref >60)
GFR calc non Af Amer: >60 mL/min (ref >60)
GLUCOSE: 107 mg/dL — AB (ref 70–99)
PHOSPHORUS: 3.4 mg/dL (ref 2.5–4.6)
Potassium: 3.7 mmol/L (ref 3.5–5.1)
Sodium: 141 mmol/L (ref 135–145)

## 2018-08-20 LAB — MAGNESIUM: Magnesium: 1.9 mg/dL (ref 1.7–2.4)

## 2018-08-20 LAB — GLUCOSE, CAPILLARY: Glucose-Capillary: 95 mg/dL (ref 70–99)

## 2018-08-20 SURGERY — KYPHOPLASTY
Anesthesia: General

## 2018-08-20 MED ORDER — BUPIVACAINE-EPINEPHRINE (PF) 0.5% -1:200000 IJ SOLN
INTRAMUSCULAR | Status: AC
  Filled 2018-08-20: qty 30

## 2018-08-20 MED ORDER — LACTATED RINGERS IV SOLN
INTRAVENOUS | Status: DC | PRN
  Administered 2018-08-20: 13:00:00 via INTRAVENOUS

## 2018-08-20 MED ORDER — IOPAMIDOL (ISOVUE-M 200) INJECTION 41%
INTRAMUSCULAR | Status: AC
  Filled 2018-08-20: qty 20

## 2018-08-20 MED ORDER — KETAMINE HCL 50 MG/ML IJ SOLN
INTRAMUSCULAR | Status: DC | PRN
  Administered 2018-08-20: 50 mg via INTRAMUSCULAR

## 2018-08-20 MED ORDER — FENTANYL CITRATE (PF) 100 MCG/2ML IJ SOLN
INTRAMUSCULAR | Status: DC | PRN
  Administered 2018-08-20 (×2): 50 ug via INTRAVENOUS

## 2018-08-20 MED ORDER — MIDAZOLAM HCL 2 MG/2ML IJ SOLN
INTRAMUSCULAR | Status: AC
  Filled 2018-08-20: qty 2

## 2018-08-20 MED ORDER — LIDOCAINE HCL (PF) 1 % IJ SOLN
INTRAMUSCULAR | Status: AC
  Filled 2018-08-20: qty 60

## 2018-08-20 MED ORDER — PROPOFOL 10 MG/ML IV BOLUS
INTRAVENOUS | Status: AC
  Filled 2018-08-20: qty 20

## 2018-08-20 MED ORDER — FENTANYL CITRATE (PF) 100 MCG/2ML IJ SOLN
INTRAMUSCULAR | Status: AC
  Filled 2018-08-20: qty 2

## 2018-08-20 MED ORDER — CHLORHEXIDINE GLUCONATE CLOTH 2 % EX PADS
6.0000 | MEDICATED_PAD | Freq: Every day | CUTANEOUS | Status: DC
  Administered 2018-08-20 – 2018-08-21 (×2): 6 via TOPICAL

## 2018-08-20 MED ORDER — LIDOCAINE HCL 1 % IJ SOLN
INTRAMUSCULAR | Status: DC | PRN
  Administered 2018-08-20: 20 mL

## 2018-08-20 MED ORDER — ONDANSETRON HCL 4 MG/2ML IJ SOLN: 4.0000 mg | Freq: Once | INTRAMUSCULAR | Status: DC | PRN

## 2018-08-20 MED ORDER — PROPOFOL 10 MG/ML IV BOLUS
INTRAVENOUS | Status: DC | PRN
  Administered 2018-08-20: 30 mg via INTRAVENOUS
  Administered 2018-08-20: 40 mg via INTRAVENOUS

## 2018-08-20 MED ORDER — METHOCARBAMOL 500 MG PO TABS
500.0000 mg | ORAL_TABLET | Freq: Four times a day (QID) | ORAL | Status: DC | PRN
  Filled 2018-08-20: qty 1

## 2018-08-20 MED ORDER — METOCLOPRAMIDE HCL 5 MG/ML IJ SOLN: 5.0000 mg | Freq: Three times a day (TID) | INTRAMUSCULAR | Status: DC | PRN

## 2018-08-20 MED ORDER — KETAMINE HCL 50 MG/ML IJ SOLN
INTRAMUSCULAR | Status: AC
  Filled 2018-08-20: qty 10

## 2018-08-20 MED ORDER — IOPAMIDOL (ISOVUE-M 200) INJECTION 41%
INTRAMUSCULAR | Status: DC | PRN
  Administered 2018-08-20: 40 mL

## 2018-08-20 MED ORDER — PROPOFOL 500 MG/50ML IV EMUL
INTRAVENOUS | Status: DC | PRN
  Administered 2018-08-20: 100 ug/kg/min via INTRAVENOUS

## 2018-08-20 MED ORDER — METOCLOPRAMIDE HCL 10 MG PO TABS
5.0000 mg | ORAL_TABLET | Freq: Three times a day (TID) | ORAL | Status: DC | PRN
  Filled 2018-08-20: qty 1

## 2018-08-20 MED ORDER — BUPIVACAINE-EPINEPHRINE (PF) 0.5% -1:200000 IJ SOLN
INTRAMUSCULAR | Status: DC | PRN
  Administered 2018-08-20: 10 mL via PERINEURAL

## 2018-08-20 MED ORDER — DOCUSATE SODIUM 100 MG PO CAPS
100.0000 mg | ORAL_CAPSULE | Freq: Two times a day (BID) | ORAL | Status: DC
  Administered 2018-08-20 – 2018-08-21 (×2): 100 mg via ORAL
  Filled 2018-08-20: qty 1

## 2018-08-20 MED ORDER — MIDAZOLAM HCL 2 MG/2ML IJ SOLN
INTRAMUSCULAR | Status: DC | PRN
  Administered 2018-08-20: 2 mg via INTRAVENOUS

## 2018-08-20 MED ORDER — FENTANYL CITRATE (PF) 100 MCG/2ML IJ SOLN
25.0000 ug | INTRAMUSCULAR | Status: DC | PRN
  Administered 2018-08-20 (×4): 25 ug via INTRAVENOUS

## 2018-08-20 MED ORDER — METHOCARBAMOL 1000 MG/10ML IJ SOLN
500.0000 mg | Freq: Four times a day (QID) | INTRAVENOUS | Status: DC | PRN
  Filled 2018-08-20: qty 5

## 2018-08-20 SURGICAL SUPPLY — 16 items
ADH SKN CLS APL DERMABOND .7 (GAUZE/BANDAGES/DRESSINGS) ×1
CEMENT KYPHON CX01A KIT/MIXER (Cement) ×2 IMPLANT
DERMABOND ADVANCED (GAUZE/BANDAGES/DRESSINGS) ×1
DERMABOND ADVANCED .7 DNX12 (GAUZE/BANDAGES/DRESSINGS) ×1 IMPLANT
DEVICE BIOPSY BONE KYPHX (INSTRUMENTS) ×2 IMPLANT
DRAPE C-ARM XRAY 36X54 (DRAPES) ×2 IMPLANT
DURAPREP 26ML APPLICATOR (WOUND CARE) ×2 IMPLANT
GLOVE SURG SYN 9.0  PF PI (GLOVE) ×1
GLOVE SURG SYN 9.0 PF PI (GLOVE) ×1 IMPLANT
GOWN SRG 2XL LVL 4 RGLN SLV (GOWNS) ×1 IMPLANT
GOWN STRL NON-REIN 2XL LVL4 (GOWNS) ×2
GOWN STRL REUS W/ TWL LRG LVL3 (GOWN DISPOSABLE) ×1 IMPLANT
GOWN STRL REUS W/TWL LRG LVL3 (GOWN DISPOSABLE) ×2
PACK KYPHOPLASTY (MISCELLANEOUS) ×2 IMPLANT
STRAP SAFETY 5IN WIDE (MISCELLANEOUS) ×2 IMPLANT
TRAY KYPHOPAK 15/3 EXPRESS 1ST (MISCELLANEOUS) ×2 IMPLANT

## 2018-08-20 NOTE — Progress Notes (Signed)
15 minute call to floor. 

## 2018-08-20 NOTE — Anesthesia Procedure Notes (Signed)
Date/Time: 08/20/2018 1:00 PM Performed by: Nelda Marseille, CRNA Pre-anesthesia Checklist: Patient identified, Emergency Drugs available, Suction available, Patient being monitored and Timeout performed Oxygen Delivery Method: Nasal cannula

## 2018-08-20 NOTE — Plan of Care (Signed)

## 2018-08-20 NOTE — Progress Notes (Signed)
To OR for L3 kyphoplasty today.

## 2018-08-20 NOTE — Transfer of Care (Signed)
Immediate Anesthesia Transfer of Care Note  Patient: Grand  Procedure(s) Performed: [name] (N/A )  Patient Location: PACU  Anesthesia Type:General  Level of Consciousness: awake, alert  and oriented  Airway & Oxygen Therapy: Patient Spontanous Breathing and Patient connected to nasal cannula oxygen  Post-op Assessment: Report given to RN and Post -op Vital signs reviewed and stable  Post vital signs: Reviewed and stable  Last Vitals:  Vitals Value Taken Time  BP 129/108 08/20/2018  1:42 PM  Temp    Pulse 119 08/20/2018  1:44 PM  Resp 19 08/20/2018  1:44 PM  SpO2 100 % 08/20/2018  1:44 PM  Vitals shown include unvalidated device data.  Last Pain:  Vitals:   08/20/18 1141  TempSrc: Temporal  PainSc: 5       Patients Stated Pain Goal: 2 (62/95/28 [id number])  Complications: No apparent anesthesia complications

## 2018-08-20 NOTE — Progress Notes (Signed)
Pt went for  l3 kyphoplasty today tol well  bandaide at lower rt  Back d/i.

## 2018-08-20 NOTE — Progress Notes (Signed)
[place] at [place] NAME: [name]    MR#:  [id number]  DATE OF BIRTH:  08-15-1946  SUBJECTIVE:  CHIEF COMPLAINT:   Chief Complaint  Patient presents with  . Shortness of Breath   -On 2 L oxygen which is acute. -For kyphoplasty today  REVIEW OF SYSTEMS:  Review of Systems  Constitutional: Negative for chills, fever and malaise/fatigue.  HENT: Negative for ear discharge, hearing loss and nosebleeds.   Eyes: Negative for blurred vision and double vision.  Respiratory: Negative for cough, shortness of breath and wheezing.   Cardiovascular: Negative for chest pain and palpitations.  Gastrointestinal: Negative for abdominal pain, constipation, diarrhea, nausea and vomiting.  Genitourinary: Negative for dysuria.  Musculoskeletal: Positive for back pain and myalgias.  Neurological: Negative for dizziness, seizures and headaches.  Psychiatric/Behavioral: Negative for depression.    DRUG ALLERGIES:  No Known Allergies  VITALS:  Blood pressure (!) 168/100, pulse (!) 115, temperature 97.8 F (36.6 C), temperature source Temporal, resp. rate 20, height 5\' 4"  (1.626 m), weight 57.6 kg, SpO2 99 %.  PHYSICAL EXAMINATION:  Physical Exam   GENERAL:  72 y.o.-year-old patient lying in the bed with no acute distress.  EYES: Pupils equal, round, reactive to light and accommodation. No scleral icterus. Extraocular muscles intact.  HEENT: Head atraumatic, normocephalic. Oropharynx and nasopharynx clear.  NECK:  Supple, no jugular venous distention. No thyroid enlargement, no tenderness.  LUNGS: Normal breath sounds bilaterally, no wheezing, rales,rhonchi or crepitation. No use of accessory muscles of respiration. Decreased bibasilar breath sounds noted. CARDIOVASCULAR: S1, S2 normal. No  rubs, or gallops. 2/6 systolic murmur present ABDOMEN: Soft, nontender, nondistended. Bowel sounds present. No organomegaly or mass.  EXTREMITIES: No pedal  edema, cyanosis, or clubbing.  NEUROLOGIC: Cranial nerves II through XII are intact. Muscle strength 5/5 in all extremities. Sensation intact. Gait not checked.  PSYCHIATRIC: The patient is alert and oriented x 3. SKIN: No obvious rash, lesion, or ulcer.    LABORATORY PANEL:   CBC Recent Labs  Lab 08/19/18 0329  WBC 6.7  HGB 12.4  HCT 36.1  PLT 303   ------------------------------------------------------------------------------------------------------------------  Chemistries  Recent Labs  Lab 08/17/18 1938  08/20/18 0641  NA 139   < > 141  K 3.2*   < > 3.7  CL 109   < > 107  CO2 23   < > 24  GLUCOSE 94   < > 107*  BUN 8   < > 10  CREATININE 0.70   < > 0.63  CALCIUM 7.6*   < > 9.0  MG  --    < > 1.9  AST 18  --   --   ALT 25  --   --   ALKPHOS 139*  --   --   BILITOT 0.4  --   --    < > = values in this interval not displayed.   ------------------------------------------------------------------------------------------------------------------  Cardiac Enzymes Recent Labs  Lab 08/17/18 1938  TROPONINI <0.03   ------------------------------------------------------------------------------------------------------------------  RADIOLOGY:  No results found.  EKG:   Orders placed or performed during the hospital encounter of 08/17/18  . ED EKG  . ED EKG  . EKG 12-Lead  . EKG 12-Lead  . EKG 12-Lead  . EKG 12-Lead    ASSESSMENT AND PLAN:   72 year old female with past medical history significant for COPD, smoking, CAD, anxiety and depression, lumbar stenosis presents to hospital from home secondary to fall and noted  to have L3 compression fracture.  1.  L3 compression fracture with severe back pain-continue pain medications -Appreciate orthopedics consult -for kyphoplasty today. -Physical therapy to reevaluate after her procedure.  2.  Hypokalemia- replaced  3.  UTI-unfortunately cultures were not sent -Continue Rocephin for now.  Change to Keflex in  a.m.  4.  Hypertension-Norvasc  5.  Depression-better today -Continue Wellbutrin and Lexapro  6.  DVT prophylaxis-heparin held due to surgery today  7.  Hypoxia-secondary to poor inspiratory effort.  Incentive spirometer and continue to wean oxygen   Wean oxygen off.  Discharge tomorrow  All the records are reviewed and case discussed with Care Management/Social Workerr. Management plans discussed with the patient, family and they are in agreement.  CODE STATUS: Full Code  TOTAL TIME TAKING CARE OF THIS PATIENT: 36 minutes.   POSSIBLE D/C TOMORROW, DEPENDING ON CLINICAL CONDITION.   Elridge Stemm M.D on 08/20/2018 at 1:03 PM  Between 7am to 6pm - Pager - [phone]  After 6pm go to [email or web address] - password [email or web address] [facility]  Office  [phone]  CC: Primary care physician; Albina Billet, MD

## 2018-08-20 NOTE — Progress Notes (Signed)
PT Cancellation Note  Patient Details Name: [name] MRN: [id number] DOB: 10-27-1946   Cancelled Treatment:    Reason Eval/Treat Not Completed: Other (comment)   Pt in bed.  Offered session and she declined as she is awaiting Kyphoplasty today.  Will continue as appropriate.   [name] 08/20/2018, 9:18 AM

## 2018-08-20 NOTE — Op Note (Signed)
08/20/2018  1:43 PM  PATIENT:  [place]  72 y.o. female  PRE-OPERATIVE DIAGNOSIS:  L3 compression fracture  POST-OPERATIVE DIAGNOSIS:  L3 compression fracture  PROCEDURE:  Procedure(s): KYPHOPLASTY-L3 (N/A)  SURGEON: Laurene Footman, MD  ASSISTANTS: None  ANESTHESIA:   local and MAC  EBL:  No intake/output data recorded.  BLOOD ADMINISTERED:none  DRAINS: none   LOCAL MEDICATIONS USED:  MARCAINE    and XYLOCAINE   SPECIMEN:  Source of Specimen:  L3 vertebral body  DISPOSITION OF SPECIMEN:  PATHOLOGY  COUNTS:  YES  TOURNIQUET:  * No tourniquets in log *  IMPLANTS: Bone cement  DICTATION: .Dragon Dictation   patient was brought to the operating room and after adequate sedation was given she was placed in the prone position.  C arm was brought in in good visualization of L3  could be made in AP and lateral projections.  After patient identification and timeout procedures were completed, a total of 10 cc 1% Xylocaine was infiltrated subcutaneously for initial skin local anesthetic.  The back was then prepped and draped in the usual sterile fashion and repeat timeout procedure carried out.  A spinal needle was then used to get down on the right side of the pedicle at L3 and a 50-50 mix of 1% Xylocaine half percent Sensorcaine with epinephrine total of 20 cc infiltrated from the bone to the skin.  After this was allowed to set a small incision was made and a trocar advanced in an extrapedicular fashion.  Biopsy was obtained and sent for specimen.  Drilling was carried out followed by inflation of the balloon to about 2-1/2 to 3 cc which across the midline.  Cement was mixed with the balloon inflated to about 2-1/2 cc, approximately 4 cc of bone cement was used to fill the vertebral body getting very good fill across the superior endplate left to right side and superior to inferior within the body without extravasation.    When the cement had set trochar removed and permanent C arm  views obtained.  The trochar having been removed Dermabond was used to close the skin followed by Band-Aid.  PLAN OF CARE: Continue as inpatient  PATIENT DISPOSITION:  PACU - hemodynamically stable.

## 2018-08-20 NOTE — Anesthesia Post-op Follow-up Note (Signed)
Anesthesia QCDR form completed.        

## 2018-08-20 NOTE — Progress Notes (Signed)
[facility] NOTE   Pharmacy Consult for electrolyte management Indication: hypokalemia  No Known Allergies  Patient Measurements: Height: 5\' 4"  (162.6 cm) Weight: 127 lb (57.6 kg) IBW/kg (Calculated) : 54.7 Adjusted Body Weight:   Vital Signs: Temp: 98.1 F (36.7 C) (10/01 0703) Temp Source: Oral (10/01 0703) BP: 146/96 (10/01 0703) Pulse Rate: 80 (10/01 0703) Intake/Output from previous day: 09/30 0701 - 10/01 0700 In: 123 [I.V.:23; IV Piggyback:100] Out: -  Intake/Output from this shift: No intake/output data recorded.  Labs: Recent Labs    08/17/18 1938 08/18/18 0406 08/19/18 0329 08/20/18 0641  WBC 8.8 7.3 6.7  --   HGB 13.4 12.5 12.4  --   HCT 38.8 35.7 36.1  --   PLT 290 302 303  --   CREATININE 0.Alison 0.82 0.65 0.63  MG  --  1.7 1.9  --   PHOS  --   --  2.8 3.4  ALBUMIN 2.9*  --   --  3.5  PROT 5.4*  --   --   --   AST 18  --   --   --   ALT 25  --   --   --   ALKPHOS 139*  --   --   --   BILITOT 0.4  --   --   --    Estimated Creatinine Clearance: 55.7 mL/min (by C-G formula based on SCr of 0.63 mg/dL).   Microbiology: No results found for this or any previous visit (from the past 720 hour(s)).  Medical History: Past Medical History:  Diagnosis Date  . Anxiety   . [name] (coronary artery disease)   . Carpal tunnel syndrome   . Cataract   . Complication of anesthesia    has woken up at times  . [name] (chronic obstructive pulmonary disease) ([place])   . CRP elevated 09/14/2015  . Depression   . Difficult intubation   . Dyslipidemia   . Dyspnea    DOE  . Dysrhythmia    hx palpatations  . Elevated sedimentation rate 09/14/2015  . Esophageal spasm   . Gastrointestinal parasites   . GERD (gastroesophageal reflux disease)   . Hiatal hernia   . History of peptic ulcer disease   . Hyperthyroidism   . Hypothyroidism   . Low magnesium levels 09/14/2015  . Pelvic fracture ([place]) 2008   fall from riding a horse  . Reflux   . Rotator  cuff injury   . Stenosis, spinal, Alison     Medications:  Infusions:  . sodium chloride Stopped (08/18/18 2223)  . cefTRIAXone (ROCEPHIN)  IV 1 g (08/19/18 2044)    Assessment: [name], [name], [name], [name], [name], Alison stenosis and other comorbidities. She is being worked up for UTI and gait issues. Potassium noted to be low. Pharmacy consulted to manage electrolytes.  Goal of Therapy:  K 3.5 to 5 Ca 8.9 to 10.3 Mg 1.7 to 2.4 Phos 2.5 to 4.6  Plan:  10/01 am electrolytes WNL.  no replacement at this time, will recheck w/ am labs.  Ahmari Duerson A, Pharm.D Clinical Pharmacist 08/20/2018,7:40 AM

## 2018-08-20 NOTE — Anesthesia Postprocedure Evaluation (Signed)
Anesthesia Post Note  Patient: [name]  Procedure(s) Performed: Claybon Jabs (N/A )  Patient location during evaluation: PACU Anesthesia Type: General Level of consciousness: awake and alert Pain management: pain level controlled Vital Signs Assessment: post-procedure vital signs reviewed and stable Respiratory status: spontaneous breathing and respiratory function stable Cardiovascular status: stable Anesthetic complications: no     Last Vitals:  Vitals:   08/20/18 1342 08/20/18 1345  BP: (!) 129/100 (!) 157/96  Pulse: (!) 121 (!) 116  Resp: (!) 23 17  Temp: 36.4 C   SpO2: 100% 100%    Last Pain:  Vitals:   08/20/18 1141  TempSrc: Temporal  PainSc: 5                  Matthan Sledge K

## 2018-08-20 NOTE — Anesthesia Preprocedure Evaluation (Addendum)
Anesthesia Evaluation  Patient identified by MRN, date of birth, ID band Patient awake    Reviewed: Allergy & Precautions, NPO status , Patient's Chart, lab work & pertinent test results  History of Anesthesia Complications (+) DIFFICULT AIRWAY and history of anesthetic complications  Airway Mallampati: II       Dental   Pulmonary neg sleep apnea, COPD,  COPD inhaler, Current Smoker,           Cardiovascular (-) hypertension(-) Past MI and (-) CHF (-) dysrhythmias (-) Valvular Problems/Murmurs     Neuro/Psych neg Seizures Anxiety Depression    GI/Hepatic Neg liver ROS, hiatal hernia, GERD  Medicated and Poorly Controlled,  Endo/Other  neg diabetesHypothyroidism   Renal/GU negative Renal ROS     Musculoskeletal   Abdominal   Peds  Hematology   Anesthesia Other Findings   Reproductive/Obstetrics                            Anesthesia Physical Anesthesia Plan  ASA: III  Anesthesia Plan: General   Post-op Pain Management:    Induction: Intravenous  PONV Risk Score and Plan: 2  Airway Management Planned: Nasal Cannula  Additional Equipment:   Intra-op Plan:   Post-operative Plan:   Informed Consent: I have reviewed the patients History and Physical, chart, labs and discussed the procedure including the risks, benefits and alternatives for the proposed anesthesia with the patient or authorized representative who has indicated his/her understanding and acceptance.     Plan Discussed with:   Anesthesia Plan Comments:         Anesthesia Quick Evaluation

## 2018-08-21 ENCOUNTER — Encounter: Payer: Self-pay | Admitting: Orthopedic Surgery

## 2018-08-21 LAB — GLUCOSE, CAPILLARY
GLUCOSE-CAPILLARY: 100 mg/dL — AB (ref 70–99)
GLUCOSE-CAPILLARY: 91 mg/dL (ref 70–99)

## 2018-08-21 LAB — MAGNESIUM: MAGNESIUM: 1.6 mg/dL — AB (ref 1.7–2.4)

## 2018-08-21 LAB — POTASSIUM: Potassium: 3.4 mmol/L — ABNORMAL LOW (ref 3.5–5.1)

## 2018-08-21 LAB — SURGICAL PATHOLOGY

## 2018-08-21 MED ORDER — POTASSIUM CHLORIDE CRYS ER 20 MEQ PO TBCR
40.0000 meq | EXTENDED_RELEASE_TABLET | Freq: Once | ORAL | Status: AC
  Administered 2018-08-21: 40 meq via ORAL
  Filled 2018-08-21: qty 2

## 2018-08-21 MED ORDER — DOCUSATE SODIUM 100 MG PO CAPS: 100.0000 mg | ORAL_CAPSULE | Freq: Two times a day (BID) | ORAL | 0 refills | Status: DC

## 2018-08-21 MED ORDER — MAGNESIUM SULFATE IN D5W 1-5 GM/100ML-% IV SOLN
1.0000 g | Freq: Once | INTRAVENOUS | Status: AC
  Administered 2018-08-21: 08:00:00 1 g via INTRAVENOUS
  Filled 2018-08-21: qty 100

## 2018-08-21 MED ORDER — CEPHALEXIN 500 MG PO CAPS: 500.0000 mg | ORAL_CAPSULE | Freq: Three times a day (TID) | ORAL | 0 refills | Status: AC

## 2018-08-21 MED ORDER — AMLODIPINE BESYLATE 5 MG PO TABS: 5.0000 mg | ORAL_TABLET | Freq: Every day | ORAL | 1 refills | Status: DC

## 2018-08-21 MED ORDER — TRAZODONE HCL 100 MG PO TABS: 100.0000 mg | ORAL_TABLET | Freq: Every day | ORAL | 2 refills | Status: DC

## 2018-08-21 MED ORDER — LACTULOSE 10 GM/15ML PO SOLN
20.0000 g | Freq: Every day | ORAL | Status: DC | PRN
  Filled 2018-08-21 (×2): qty 30

## 2018-08-21 MED ORDER — HYDROCODONE-ACETAMINOPHEN 5-325 MG PO TABS: 1.0000 | ORAL_TABLET | ORAL | 0 refills | Status: DC | PRN

## 2018-08-21 NOTE — Care Management Important Message (Signed)
Important Message  Patient Details  Name: [name] MRN: [id number] Date of Birth: 01-05-1946   Medicare Important Message Given:  Yes    [name] 08/21/2018, 10:59 AM

## 2018-08-21 NOTE — Discharge Summary (Signed)
[facility] [place] NAME: [name]    MR#:  [id number]  DATE OF BIRTH:  [birth year]  DATE OF ADMISSION:  08/17/2018   ADMITTING PHYSICIAN: Amelia Jo, MD  DATE OF DISCHARGE:  08/21/18  PRIMARY CARE PHYSICIAN: Albina Billet, MD   ADMISSION DIAGNOSIS:   Dehydration [E86.0] Acute cystitis with hematuria [N30.01]  DISCHARGE DIAGNOSIS:   Active Problems:   Acute UTI   SECONDARY DIAGNOSIS:   Past Medical History:  Diagnosis Date  . Anxiety   . CAD (coronary artery disease)   . Carpal tunnel syndrome   . Cataract   . Complication of anesthesia    has woken up at times  . COPD (chronic obstructive pulmonary disease) ([place])   . CRP elevated 09/14/2015  . Depression   . Difficult intubation   . Dyslipidemia   . Dyspnea    DOE  . Dysrhythmia    hx palpatations  . Elevated sedimentation rate 09/14/2015  . Esophageal spasm   . Gastrointestinal parasites   . GERD (gastroesophageal reflux disease)   . Hiatal hernia   . History of peptic ulcer disease   . Hyperthyroidism   . Hypothyroidism   . Low magnesium levels 09/14/2015  . Pelvic fracture ([place]) 2008   fall from riding a horse  . Reflux   . Rotator cuff injury   . Stenosis, spinal, lumbar     HOSPITAL COURSE:   72 year old female with past medical history significant for COPD, smoking, CAD, anxiety and depression, lumbar stenosis presents to hospital from home secondary to fall and noted to have L3 compression fracture.  1.  L3 compression fracture with severe back pain-continue pain medications -Appreciate orthopedics consult -s/p kyphoplasty. -Physical therapy consulted.  Will be going home with home health.  Very impulsive, they have recommended supervision at home.  2.  Hypokalemia- replaced  3.  UTI-unfortunately cultures were not sent -Received Rocephin in the hospital.  Will be discharged on Keflex.  4.  Hypertension-Norvasc  5.   Depression-better now -Continue Wellbutrin and Lexapro, also on trazodone at bedtime  6.  COPD-stable.  Continue home inhalers  7.  Hypoxia-secondary to poor inspiratory effort.  Incentive spirometer and continue to 2 L oxygen.  Also has underlying COPD.  Discharge home with home health today  DISCHARGE CONDITIONS:   Guarded  CONSULTS OBTAINED:   Treatment Team:  Hessie Knows, MD  DRUG ALLERGIES:   No Known Allergies DISCHARGE MEDICATIONS:   Allergies as of 08/21/2018   No Known Allergies     Medication List    STOP taking these medications   hydrochlorothiazide 12.5 MG capsule Commonly known as:  MICROZIDE   oxyCODONE 5 MG immediate release tablet Commonly known as:  Oxy IR/ROXICODONE     TAKE these medications   ALPRAZolam 1 MG tablet Commonly known as:  XANAX Take 1 tablet (1 mg total) by mouth 4 (four) times daily as needed. What changed:  when to take this   amLODipine 5 MG tablet Commonly known as:  NORVASC Take 1 tablet (5 mg total) by mouth daily. Start taking on:  08/22/2018   buPROPion 150 MG 12 hr tablet Commonly known as:  WELLBUTRIN SR Take 150 mg by mouth daily.   cephALEXin 500 MG capsule Commonly known as:  KEFLEX Take 1 capsule (500 mg total) by mouth 3 (three) times daily for 3 days.   COMBIVENT RESPIMAT 20-100 MCG/ACT Aers respimat Generic drug:  Ipratropium-Albuterol Inhale 1  puff into the lungs every 6 (six) hours as needed for wheezing or shortness of breath.   docusate sodium 100 MG capsule Commonly known as:  COLACE Take 1 capsule (100 mg total) by mouth 2 (two) times daily.   escitalopram 10 MG tablet Commonly known as:  LEXAPRO Take 10 mg by mouth daily.   Fluticasone-Salmeterol 100-50 MCG/DOSE Aepb Commonly known as:  ADVAIR Inhale 1 puff into the lungs every 12 (twelve) hours.   HYDROcodone-acetaminophen 5-325 MG tablet Commonly known as:  NORCO/VICODIN Take 1-2 tablets by mouth every 4 (four) hours as needed for  moderate pain.   levothyroxine 88 MCG tablet Commonly known as:  SYNTHROID, LEVOTHROID Take 88 mcg by mouth daily before breakfast.   magnesium oxide 400 MG tablet Commonly known as:  MAG-OX Take 1 tablet (400 mg total) by mouth 2 (two) times daily.   tizanidine 2 MG capsule Commonly known as:  ZANAFLEX Take 1 capsule (2 mg total) by mouth 3 (three) times daily as needed for muscle spasms.   traZODone 100 MG tablet Commonly known as:  DESYREL Take 1 tablet (100 mg total) by mouth at bedtime. What changed:    medication strength  how much to take   Vitamin D3 5000 units Tabs Take 1 tablet (5,000 Units total) by mouth daily.            Durable Medical Equipment  (From admission, onward)         Start     Ordered   08/21/18 1257  For home use only DME oxygen  Once    Question Answer Comment  Mode or (Route) Nasal cannula   Liters per Minute 2   Frequency Continuous (stationary and portable oxygen unit needed)   Oxygen conserving device Yes   Oxygen delivery system Gas      08/21/18 1256           DISCHARGE INSTRUCTIONS:   1.  PCP follow-up in 1 to 2 weeks 2.  Orthopedics follow-up as scheduled  DIET:   Cardiac diet  ACTIVITY:   Activity as tolerated  OXYGEN:   Home Oxygen: Yes.    Oxygen Delivery: 2 liters/min via Patient connected to nasal cannula oxygen  DISCHARGE LOCATION:   home   If you experience worsening of your admission symptoms, develop shortness of breath, life threatening emergency, suicidal or homicidal thoughts you must seek medical attention immediately by calling 911 or calling your MD immediately  if symptoms less severe.  You Must read complete instructions/literature along with all the possible adverse reactions/side effects for all the Medicines you take and that have been prescribed to you. Take any new Medicines after you have completely understood and accpet all the possible adverse reactions/side effects.   Please  note  You were cared for by a hospitalist during your hospital stay. If you have any questions about your discharge medications or the care you received while you were in the hospital after you are discharged, you can call the unit and asked to speak with the hospitalist on call if the hospitalist that took care of you is not available. Once you are discharged, your primary care physician will handle any further medical issues. Please note that NO REFILLS for any discharge medications will be authorized once you are discharged, as it is imperative that you return to your primary care physician (or establish a relationship with a primary care physician if you do not have one) for your aftercare needs so that they  can reassess your need for medications and monitor your lab values.    On the day of Discharge:  VITAL SIGNS:   Blood pressure (!) 151/84, pulse (!) 118, temperature 99.1 F (37.3 C), temperature source Oral, resp. rate 18, height 5\' 4"  (1.626 m), weight 60.1 kg, SpO2 98 %.  PHYSICAL EXAMINATION:   GENERAL:  72 y.o.-year-old patient lying in the bed with no acute distress.  EYES: Pupils equal, round, reactive to light and accommodation. No scleral icterus. Extraocular muscles intact.  HEENT: Head atraumatic, normocephalic. Oropharynx and nasopharynx clear.  NECK:  Supple, no jugular venous distention. No thyroid enlargement, no tenderness.  LUNGS: Normal breath sounds bilaterally, no wheezing, rales,rhonchi or crepitation. No use of accessory muscles of respiration. Decreased bibasilar breath sounds noted. CARDIOVASCULAR: S1, S2 normal. No  rubs, or gallops. 2/6 systolic murmur present ABDOMEN: Soft, nontender, nondistended. Bowel sounds present. No organomegaly or mass.  EXTREMITIES: No pedal edema, cyanosis, or clubbing.  NEUROLOGIC: Cranial nerves II through XII are intact. Muscle strength 5/5 in all extremities. Sensation intact. Gait not checked.  PSYCHIATRIC: The patient is alert  and oriented x 3. SKIN: No obvious rash, lesion, or ulcer.   DATA REVIEW:   CBC Recent Labs  Lab 08/19/18 0329  WBC 6.7  HGB 12.4  HCT 36.1  PLT 303    Chemistries  Recent Labs  Lab 08/17/18 1938  08/20/18 0641 08/21/18 0350  NA 139   < > 141  --   K 3.2*   < > 3.7 3.4*  CL 109   < > 107  --   CO2 23   < > 24  --   GLUCOSE 94   < > 107*  --   BUN 8   < > 10  --   CREATININE 0.70   < > 0.63  --   CALCIUM 7.6*   < > 9.0  --   MG  --    < > 1.9 1.6*  AST 18  --   --   --   ALT 25  --   --   --   ALKPHOS 139*  --   --   --   BILITOT 0.4  --   --   --    < > = values in this interval not displayed.     Microbiology Results  Results for orders placed or performed during the hospital encounter of 10/01/16  Surgical PCR screen     Status: None   Collection Time: 10/01/16  1:26 PM  Result Value Ref Range Status   MRSA, PCR NEGATIVE NEGATIVE Final   Staphylococcus aureus NEGATIVE NEGATIVE Final    Comment:        The Xpert SA Assay (FDA approved for NASAL specimens in patients over 23 years of age), is one component of a comprehensive surveillance program.  Test performance has been validated by [facility] for patients greater than or equal to 22 year old. It is not intended to diagnose infection nor to guide or monitor treatment.     RADIOLOGY:  No results found.   Management plans discussed with the patient, family and they are in agreement.  CODE STATUS:     Code Status Orders  (From admission, onward)         Start     Ordered   08/18/18 0249  Full code  Continuous     08/18/18 0248        Code Status History  Date Active Date Inactive Code Status Order ID Comments User Context   10/01/2016 1138 10/04/2016 1744 Full Code [id number]  Fritzi Mandes, MD Inpatient    Advance Directive Documentation     Most Recent Value  Type of Advance Directive  Living will  Pre-existing out of facility DNR order (yellow form or pink MOST form)  -    "MOST" Form in Place?  -      TOTAL TIME TAKING CARE OF THIS PATIENT: 38 minutes.    Ishia Tenorio M.D on 08/21/2018 at 3:51 PM  Between 7am to 6pm - Pager - [phone]  After 6pm go to [email or web address] - Proofreader  [facility]  Office  [phone]  CC: Primary care physician; Albina Billet, MD   Note: This dictation was prepared with Dragon dictation along with smaller phrase technology. Any transcriptional errors that result from this process are unintentional.

## 2018-08-21 NOTE — Progress Notes (Signed)
Patient appears more comfortable today, but still asking for pain meds.  Has some chronic pain as well. Neuro intact. Will have her follow up in 2 weeks, ok to remove band aid and shower starting tomorrow.

## 2018-08-21 NOTE — Evaluation (Signed)
Physical Therapy Re-evaluation Patient Details Name: [name] MRN: [id number] DOB: [birth year] Today's Date: 08/21/2018   History of Present Illness  72 yo female with onset of fall and subsequent admission for UTI was noted to have L3 compression fracture.  L3 kyphoplasty performed 08/20/2018.  Per nsg pt typically takes a great deal of meds at home, has been asking for a lot of doses here.  PMHx:  anxiety, CAD, CTS, anesthesia complications (waking up), COPD, depression, esophageal spasm, pelvic fracture, PUD, rotator cuff injury, spinal stenosis.  Clinical Impression  Patient is a 72 year old female with above noted diagnosis.  She is independent with ADL's and mobility at basleline but has an extensive fall hx.  Pt very impulsive and needing redirection throughout evaluation.  She was able to perform bed mobility without physical assist but demonstrated limited carryover with log rolling technique.  Pt able to sit at EOB without increased pain and stand with moderate use of UE's.  PT reviewed BLT back precautions and pt expressed understanding.  She was able to ambulate 200 ft with RW but required 2L of O2 for sats to remain WNL.  Pt ascended/descended 6 steps requiring mod A for balance and support.  Pt also sat down on steps when PT asked her to step down onto the floor.  Pt is very HOH and stated that she thought that is what the PT wanted her to do.  Pt will benefit from continued skilled PT with focus on strength, tolerance to activity, fall prevention and pain management.    Follow Up Recommendations Home health PT;Supervision/Assistance - 24 hour    Equipment Recommendations  None recommended by PT    Recommendations for Other Services       Precautions / Restrictions Precautions Precautions: Fall;Back Precaution Booklet Issued: No Precaution Comments: Recent multiple fall hx; High fall risk Restrictions Weight Bearing Restrictions: No      Mobility  Bed Mobility Overal  bed mobility: Needs Assistance Bed Mobility: Supine to Sit;Sit to Supine     Supine to sit: Supervision;Min guard Sit to supine: Supervision;Min guard   General bed mobility comments: Pt able to demonstrate understanding of log rolling technique but impulsive and with some rotation of upper thoracic area.    Transfers Overall transfer level: Needs assistance Equipment used: Rolling walker (2 wheeled) Transfers: Sit to/from Stand Sit to Stand: Min guard        Lateral/Scoot Transfers: Min guard General transfer comment: Able to rise from EOB without physical assist.  No pain increase reported.  Ambulation/Gait Ambulation/Gait assistance: Min guard Gait Distance (Feet): 200 Feet Assistive device: Rolling walker (2 wheeled)     Gait velocity interpretation: 1.31 - 2.62 ft/sec, indicative of limited community ambulator General Gait Details: Moderate foot clearance and step length; pt did report fatigue and weakness in her LE's following ambulation and stair negotiation.  Stairs Stairs: Yes Stairs assistance: Mod assist Stair Management: Two rails;Alternating pattern Number of Stairs: 6 General stair comments: Pt very impulsive on steps, picking RW up and placing it on the steps and almost losing her balance posteriorly; also stopped mid step when descending and sat down on the steps when PT said, "put your foot on the floor.".  Pt stated that she thought PT told her to sit down on the floor.  Wheelchair Mobility    Modified Rankin (Stroke Patients Only)       Balance Overall balance assessment: History of Falls;Needs assistance Sitting-balance support: Bilateral upper extremity supported;Feet unsupported  Sitting balance-Leahy Scale: Good     Standing balance support: During functional activity;Bilateral upper extremity supported Standing balance-Leahy Scale: Fair Standing balance comment: Preferred RW for support due to pain.  Able to stand for short periods of time  without RW.                             Pertinent Vitals/Pain Pain Score: 5  Pain Location: Low back Pain Descriptors / Indicators: Constant;Grimacing;Sharp Pain Intervention(s): Limited activity within patient's tolerance;Monitored during session    Home Living Family/patient expects to be discharged to:: Private residence Living Arrangements: Alone Available Help at Discharge: Available PRN/intermittently;Neighbor Type of Home: House Home Access: Stairs to enter Entrance Stairs-Rails: None Entrance Stairs-Number of Steps: 6 wooden covered front outdoor steps with handrails and 3 covered wood steps in the garage Home Layout: One level Home Equipment: Environmental consultant - 2 wheels;Cane - single point;Shower seat      Prior Function Level of Independence: Independent         Comments: Pt denies recent use of AD, history of multiple falls - 10 in the past 12 months resulting in various fractures and injuries, indep with ADL and IADL but has difficulty with donning/tying shoes and bending over for ADL 2/2 back pain, pt minimizes nighttime driving 2/2 vision, and indep with caring for her dog. Neighbor assists with taking out trash and other housekeeping tasks. Per chart review, pt has been getting some paid assistance but has not recently over conflicts with caregivers     Hand Dominance   Dominant Hand: Right    Extremity/Trunk Assessment   Upper Extremity Assessment Upper Extremity Assessment: Generalized weakness(Grossly 4-/5 bilaterally. pt was not able to sustain 5 sec hold for MMT due to reported rotator cuff injury.)    Lower Extremity Assessment Lower Extremity Assessment: Overall WFL for tasks assessed(Grossly 4/5 bilaterally with no report of N/T.)    Cervical / Trunk Assessment Cervical / Trunk Assessment: Kyphotic  Communication   Communication: HOH  Cognition Arousal/Alertness: Awake/alert Behavior During Therapy: Impulsive                                    General Comments: Mildly impulsive, requires step by step instruction and verbal/tactile cues to maximize safety; frequent redirection and repetition of questions needed to keep pt on task.      General Comments General comments (skin integrity, edema, etc.): O2 sats >95% on 2L, HR 105-107 t/o session    Exercises Other Exercises Other Exercises: Education regarding BLT back precautions and log rolling technique. x10 min Other Exercises: Pt educated in general back precautions and how to implement during functional mobility, ADL, and IADL tasks with handout provided with written and visual instruction. Pt required reinforcement to support recall and carryover.   Assessment/Plan    PT Assessment Patient needs continued PT services  PT Problem List Decreased strength;Decreased mobility;Decreased balance;Decreased knowledge of use of DME;Decreased activity tolerance;Pain;Decreased cognition       PT Treatment Interventions DME instruction;Therapeutic activities;Cognitive remediation;Gait training;Therapeutic exercise;Stair training;Balance training;Patient/family education;Functional mobility training;Neuromuscular re-education    PT Goals (Current goals can be found in the Care Plan section)  Acute Rehab PT Goals Patient Stated Goal: to go home today and see my dog and have less pain PT Goal Formulation: With patient Time For Goal Achievement: 09/04/18 Potential to Achieve Goals: Fair    Frequency  7X/week   Barriers to discharge        Co-evaluation               AM-PAC PT "6 Clicks" Daily Activity  Outcome Measure Difficulty turning over in bed (including adjusting bedclothes, sheets and blankets)?: A Little Difficulty moving from lying on back to sitting on the side of the bed? : A Little Difficulty sitting down on and standing up from a chair with arms (e.g., wheelchair, bedside commode, etc,.)?: A Little Help needed moving to and from a bed to chair  (including a wheelchair)?: A Little Help needed walking in hospital room?: A Little Help needed climbing 3-5 steps with a railing? : A Lot 6 Click Score: 17    End of Session Equipment Utilized During Treatment: Gait belt Activity Tolerance: Patient tolerated treatment well;Patient limited by fatigue Patient left: with call bell/phone within reach;in bed;with bed alarm set Nurse Communication: Mobility status PT Visit Diagnosis: Unsteadiness on feet (R26.81);Repeated falls (R29.6);History of falling (Z91.81)    Time: 1840-3754 PT Time Calculation (min) (ACUTE ONLY): 22 min   Charges:   PT Evaluation $PT Re-evaluation: 1 Re-eval PT Treatments $Therapeutic Activity: 8-22 mins        Roxanne Gates, PT, DPT  Roxanne Gates 08/21/2018, 1:48 PM

## 2018-08-21 NOTE — Discharge Instructions (Signed)
Diet: As you were doing prior to hospitalization   Shower:  You may remove band-aid and shower.                                               Follow- Up Appointment:  Please call for an appointment to be seen in 2 weeks at [facility]

## 2018-08-21 NOTE — Progress Notes (Signed)
Received Md order to discharge patient to home, reviewed home meds, prescriptions, discharge instructions and follow up appointments with patient and patient vebalized understanding discharge to home with oxygen and will be followed with [facility].

## 2018-08-21 NOTE — Care Management Note (Signed)
Case Management Note  Patient Details  Name: Amere Iott MRN: [id number] Date of Birth: [birth year]  Subjective/Objective:     Admitted to [facility] with the diagnosis of urinary tract infection, Lives alone. Sees Dr. Hall Busing. Last visit was 3 weeks ago. Friend is Vaughan Basta [phone]). Prescriptions are filled at [facility]. Home Health 2 years ago. Doesn't remember name of agency. [place] 2 years ago. Rolling walker and cane in the home. No home oxygen.Takes care of all basic activities of daily living herself, drives. Friend will be transporting. Rodena Piety (neighbor) will be staying in the home with Ms Dragoo              Action/Plan: Discussed home heal  And durable medical equipment agencies. Chose [facility]. Will update Floydene Flock, [facility] rep[resentative   Expected Discharge Date:  08/21/18               Expected Discharge Plan:     In-House Referral:   yes  Discharge planning Services   yes  Post Acute Care Choice:   yes Choice offered to:   patient  DME Arranged:   yes DME Agency:   [facility]  HH Arranged:   yes HH Agency:   [facility]  Status of Service:     If discussed at [place] of Stay Meetings, dates discussed:    Additional Comments:  Shelbie Ammons, RN MSN [place] [phone] 08/21/2018, 1:35 PM

## 2018-08-21 NOTE — Progress Notes (Signed)
MEDICATION RELATED CONSULT NOTE   Pharmacy Consult for electrolyte management Indication: hypokalemia  No Known Allergies  Patient Measurements: Height: 5\' 4"  (162.6 cm) Weight: 132 lb 9.6 oz (60.1 kg) IBW/kg (Calculated) : 54.7 Adjusted Body Weight:   Vital Signs: Temp: 98.1 F (36.7 C) (10/02 0429) Temp Source: Oral (10/02 0429) BP: 170/96 (10/02 0429) Pulse Rate: 101 (10/02 0429) Intake/Output from previous day: 10/01 0701 - 10/02 0700 In: 393  Out: 2 [Blood:2] Intake/Output from this shift: No intake/output data recorded.  Labs: Recent Labs    08/19/18 0329 08/20/18 0641 08/21/18 0350  WBC 6.7  --   --   HGB 12.4  --   --   HCT 36.1  --   --   PLT 303  --   --   CREATININE 0.65 0.63  --   MG 1.9 1.9 1.6*  PHOS 2.8 3.4  --   ALBUMIN  --  3.5  --    Estimated Creatinine Clearance: 55.7 mL/min (by C-G formula based on SCr of 0.63 mg/dL).   Assessment: 81 yof cc SOB with PMH tobacco abuse, COPD, CAD, anxiety/depression, frequent falls, lumbar stenosis and other comorbidities. She is being worked up for UTI and gait issues. Potassium noted to be low. Pharmacy consulted to manage electrolytes.  Goal of Therapy:  K 3.5 to 5 Ca 8.9 to 10.3 Mg 1.7 to 2.4 Phos 2.5 to 4.6  Plan:  K 3.4, Mag 1.6 - MD has already ordered KCl 40 mEq PO x1 and mag sulfate 1g IV x1 Will recheck w/ am labs.   Rocky Morel, Pharm.D Clinical Pharmacist 08/21/2018,7:25 AM

## 2018-08-21 NOTE — Progress Notes (Signed)
SATURATION QUALIFICATIONS: (This note is used to comply with regulatory documentation for home oxygen)  Patient Saturations on Room Air at Rest = 92%  Patient Saturations on Room Air while Ambulating = 84%  Patient Saturations on 2 Liters of oxygen while Ambulating = 95%  Please briefly explain why patient needs home oxygen:COPD

## 2018-08-21 NOTE — Progress Notes (Signed)
Occupational Therapy Treatment Patient Details Name: [name] MRN: [id number] DOB: 06-25-1946 Today's Date: 08/21/2018    History of present illness 72 yo female with onset of fall and subsequent admission for UTI was noted to have L3 compression fracture.  L3 kyphoplasty performed 08/20/2018.  Per nsg pt typically takes a great deal of meds at home, has been asking for a lot of doses here.  PMHx:  anxiety, CAD, CTS, anesthesia complications (waking up), COPD, depression, esophageal spasm, pelvic fracture, PUD, rotator cuff injury, spinal stenosis.   OT comments  Pt seen for OT tx this date. Pt reports decreased low back pain (4-5/10 pain) and demo's improved functional mobility following L3 kyphoplasty performed previous date. Pt continues to be mildly impulsive requiring cues and additional instruction and trials to improve safety awareness and implementation of general back precautions after initial education/training. Handout provided and reviewed. Pt verbalizes understanding however demo's impaired retention when asked to implement learned techniques, particularly during bed mobility. Pt required slow, methodical, step by step instructions with verbal and tactile cues initially decreasing to supervision and verbal cues for technique with multiple trials. Pt progressing towards goals. Feel pt is able to return home but will require 24/7 supervision/assist for safety during mobility and ADL tasks in order to maximize a safe transition home and minimize risk of falls, injury, and readmission. MD and RNCM notified of change to discharge recommendation. Will continue to progress.   Follow Up Recommendations  Home health OT;Supervision/Assistance - 24 hour    Equipment Recommendations  Other (comment)(reacher)    Recommendations for Other Services      Precautions / Restrictions Precautions Precautions: Fall;Back Precaution Booklet Issued: Yes (comment) Precaution Comments: Recent multiple  fall hx; High fall risk; no bending/lifting/twisting general back precautions Restrictions Weight Bearing Restrictions: No       Mobility Bed Mobility Overal bed mobility: Needs Assistance Bed Mobility: Supine to Sit;Sit to Supine     Supine to sit: Supervision;Min guard Sit to supine: Supervision;Min guard   General bed mobility comments: pt instructed in step by step log roll techniques to maximize safety after back surgery, initially mildly impulsive, requiring additional education/training with verbal and tactile cues for safety and step by step instructions. Repeated sup<>sit with decreasing tactile and verbal cues required and improved technique demonstrated with immediate recall/trial and delayed recall/trial  Transfers Overall transfer level: Needs assistance Equipment used: None Transfers: Sit to/from Stand;Lateral/Scoot Transfers Sit to Stand: Min guard        Lateral/Scoot Transfers: Min guard      Balance Overall balance assessment: History of Falls;Needs assistance Sitting-balance support: Bilateral upper extremity supported;Feet unsupported Sitting balance-Leahy Scale: Good     Standing balance support: No upper extremity supported;During functional activity Standing balance-Leahy Scale: Fair                             ADL either performed or assessed with clinical judgement   ADL Overall ADL's : Needs assistance/impaired                                       General ADL Comments: pt requires min A for LB ADL and max cues for safety/maintaining back precautions during ADL tasks.      Vision Baseline Vision/History: Wears glasses Wears Glasses: At all times Patient Visual Report: No change from baseline;Diplopia(pt notes double  vision for past year that is being corrected by wearing eye glasses )     Perception     Praxis      Cognition Arousal/Alertness: Awake/alert Behavior During Therapy: Impulsive                                    General Comments: Mildly impulsive, requires step by step instruction and verbal/tactile cues to maximize safety        Exercises Other Exercises Other Exercises: Pt educated in general back precautions and how to implement during functional mobility, ADL, and IADL tasks with handout provided with written and visual instruction. Pt required reinforcement to support recall and carryover.   Shoulder Instructions       General Comments O2 sats >95% on 2L, HR 105-107 t/o session    Pertinent Vitals/ Pain       Pain Score: 5  Pain Location: low back Pain Descriptors / Indicators: Constant;Grimacing;Sharp Pain Intervention(s): Limited activity within patient's tolerance;Monitored during session;Repositioned  Home Living                                          Prior Functioning/Environment              Frequency  Min 2X/week        Progress Toward Goals  OT Goals(current goals can now be found in the care plan section)  Progress towards OT goals: Progressing toward goals  Acute Rehab OT Goals Patient Stated Goal: to go home today and see my dog and have less pain OT Goal Formulation: With patient Time For Goal Achievement: 09/02/18 Potential to Achieve Goals: Good  Plan Discharge plan needs to be updated;Frequency remains appropriate    Co-evaluation                 AM-PAC PT "6 Clicks" Daily Activity     Outcome Measure   Help from another person eating meals?: None Help from another person taking care of personal grooming?: None Help from another person toileting, which includes using toliet, bedpan, or urinal?: A Little Help from another person bathing (including washing, rinsing, drying)?: A Little Help from another person to put on and taking off regular upper body clothing?: None Help from another person to put on and taking off regular lower body clothing?: A Little 6 Click Score: 21    End  of Session Equipment Utilized During Treatment: Oxygen(2L)  OT Visit Diagnosis: Other abnormalities of gait and mobility (R26.89);Repeated falls (R29.6);Muscle weakness (generalized) (M62.81);Pain;Other symptoms and signs involving cognitive function Pain - Right/Left: Left Pain - part of body: Hip(lumbar spine)   Activity Tolerance Patient tolerated treatment well   Patient Left in bed;with call bell/phone within reach;with bed alarm set;with SCD's reapplied   Nurse Communication          Time: 1120-1200 OT Time Calculation (min): 40 min  Charges: OT General Charges $OT Visit: 1 Visit OT Treatments $Self Care/Home Management : 38-52 mins  Jeni Salles, MPH, MS, OTR/L ascom [phone] 08/21/18, 1:06 PM

## 2018-08-21 NOTE — Progress Notes (Signed)
PT Cancellation Note  Patient Details Name: [name] MRN: [id number] DOB: 1946-02-21   Cancelled Treatment:    Reason Eval/Treat Not Completed: Patient declined, no reason specified.  Order received.  Chart reviewed.  Pt eager to work with PT but requesting 20 min to finish breakfast.  Will re-attempt again shortly.   Roxanne Gates, PT, DPT 08/21/2018, 8:50 AM

## 2018-08-22 ENCOUNTER — Other Ambulatory Visit: Payer: Self-pay | Admitting: Nurse Practitioner

## 2018-08-22 DIAGNOSIS — M48061 Spinal stenosis, lumbar region without neurogenic claudication: Secondary | ICD-10-CM | POA: Diagnosis not present

## 2018-08-22 DIAGNOSIS — S40811D Abrasion of right upper arm, subsequent encounter: Secondary | ICD-10-CM | POA: Diagnosis not present

## 2018-08-22 DIAGNOSIS — G8929 Other chronic pain: Secondary | ICD-10-CM | POA: Diagnosis not present

## 2018-08-22 DIAGNOSIS — S32039D Unspecified fracture of third lumbar vertebra, subsequent encounter for fracture with routine healing: Secondary | ICD-10-CM | POA: Diagnosis not present

## 2018-08-22 DIAGNOSIS — I2510 Atherosclerotic heart disease of native coronary artery without angina pectoris: Secondary | ICD-10-CM | POA: Diagnosis not present

## 2018-08-22 DIAGNOSIS — E785 Hyperlipidemia, unspecified: Secondary | ICD-10-CM | POA: Diagnosis not present

## 2018-08-22 DIAGNOSIS — N390 Urinary tract infection, site not specified: Secondary | ICD-10-CM | POA: Diagnosis not present

## 2018-08-22 DIAGNOSIS — M545 Low back pain: Secondary | ICD-10-CM | POA: Diagnosis not present

## 2018-08-22 DIAGNOSIS — J441 Chronic obstructive pulmonary disease with (acute) exacerbation: Secondary | ICD-10-CM | POA: Diagnosis not present

## 2018-08-22 DIAGNOSIS — K219 Gastro-esophageal reflux disease without esophagitis: Secondary | ICD-10-CM | POA: Diagnosis not present

## 2018-08-22 DIAGNOSIS — M7918 Myalgia, other site: Secondary | ICD-10-CM

## 2018-08-26 ENCOUNTER — Telehealth: Payer: Self-pay | Admitting: *Deleted

## 2018-08-26 NOTE — Telephone Encounter (Deleted)
patient's wife states medication was approved.

## 2018-08-26 NOTE — Telephone Encounter (Signed)
Spoke with patient to let her know that her acute problem with her back needs to be managed by her PCP.  And we can continue to manage her chronic pain.  Patient states that she is out of her medication and wants to have an early fill.  When asked why she was out of pain medication she states that she has trouble keeping pain medicine in her house and reports that people that work for her in her home are taking her medication.  Explained that we can't fill early and that the PCP needs to manage her acute pain.

## 2018-08-27 ENCOUNTER — Telehealth: Payer: Self-pay | Admitting: *Deleted

## 2018-08-27 DIAGNOSIS — S80812A Abrasion, left lower leg, initial encounter: Secondary | ICD-10-CM | POA: Diagnosis not present

## 2018-08-27 DIAGNOSIS — M545 Low back pain: Secondary | ICD-10-CM | POA: Diagnosis not present

## 2018-08-27 DIAGNOSIS — E038 Other specified hypothyroidism: Secondary | ICD-10-CM | POA: Diagnosis not present

## 2018-08-27 DIAGNOSIS — Z23 Encounter for immunization: Secondary | ICD-10-CM | POA: Diagnosis not present

## 2018-08-27 NOTE — Telephone Encounter (Signed)
This is correct, no early refills . Thanks

## 2018-08-27 NOTE — Telephone Encounter (Signed)
Spoke with patient to let her know that she can not fill her pain medication early, per [employer] I did tell her that she could go the ED if she has this much pain or she could contact the person who prescribed the hydrocodone - apap  For her postoperative pain medicine. Patient verbalizes u/o information.

## 2018-09-11 DIAGNOSIS — S72141D Displaced intertrochanteric fracture of right femur, subsequent encounter for closed fracture with routine healing: Secondary | ICD-10-CM | POA: Diagnosis not present

## 2018-09-18 DIAGNOSIS — S32039A Unspecified fracture of third lumbar vertebra, initial encounter for closed fracture: Secondary | ICD-10-CM | POA: Diagnosis not present

## 2018-09-18 DIAGNOSIS — J449 Chronic obstructive pulmonary disease, unspecified: Secondary | ICD-10-CM | POA: Diagnosis not present

## 2018-09-18 DIAGNOSIS — M48061 Spinal stenosis, lumbar region without neurogenic claudication: Secondary | ICD-10-CM | POA: Diagnosis not present

## 2018-09-22 DIAGNOSIS — J441 Chronic obstructive pulmonary disease with (acute) exacerbation: Secondary | ICD-10-CM | POA: Diagnosis not present

## 2018-09-23 DIAGNOSIS — R0989 Other specified symptoms and signs involving the circulatory and respiratory systems: Secondary | ICD-10-CM | POA: Diagnosis not present

## 2018-09-23 DIAGNOSIS — M545 Low back pain: Secondary | ICD-10-CM | POA: Diagnosis not present

## 2018-09-30 ENCOUNTER — Ambulatory Visit: Payer: Medicare HMO | Attending: Nurse Practitioner | Admitting: Nurse Practitioner

## 2018-09-30 ENCOUNTER — Encounter: Payer: Self-pay | Admitting: Nurse Practitioner

## 2018-09-30 ENCOUNTER — Other Ambulatory Visit: Payer: Self-pay

## 2018-09-30 VITALS — BP 160/95 | HR 109 | Temp 98.2°F | Resp 93 | Ht 64.0 in | Wt 132.0 lb

## 2018-09-30 DIAGNOSIS — R790 Abnormal level of blood mineral: Secondary | ICD-10-CM

## 2018-09-30 DIAGNOSIS — K219 Gastro-esophageal reflux disease without esophagitis: Secondary | ICD-10-CM | POA: Diagnosis not present

## 2018-09-30 DIAGNOSIS — E559 Vitamin D deficiency, unspecified: Secondary | ICD-10-CM | POA: Insufficient documentation

## 2018-09-30 DIAGNOSIS — Z9889 Other specified postprocedural states: Secondary | ICD-10-CM | POA: Insufficient documentation

## 2018-09-30 DIAGNOSIS — S72141A Displaced intertrochanteric fracture of right femur, initial encounter for closed fracture: Secondary | ICD-10-CM | POA: Diagnosis not present

## 2018-09-30 DIAGNOSIS — M7918 Myalgia, other site: Secondary | ICD-10-CM | POA: Diagnosis not present

## 2018-09-30 DIAGNOSIS — Z79891 Long term (current) use of opiate analgesic: Secondary | ICD-10-CM

## 2018-09-30 DIAGNOSIS — M549 Dorsalgia, unspecified: Secondary | ICD-10-CM | POA: Insufficient documentation

## 2018-09-30 DIAGNOSIS — Z79899 Other long term (current) drug therapy: Secondary | ICD-10-CM | POA: Diagnosis not present

## 2018-09-30 DIAGNOSIS — M4856XA Collapsed vertebra, not elsewhere classified, lumbar region, initial encounter for fracture: Secondary | ICD-10-CM | POA: Insufficient documentation

## 2018-09-30 DIAGNOSIS — Z5181 Encounter for therapeutic drug level monitoring: Secondary | ICD-10-CM | POA: Diagnosis not present

## 2018-09-30 DIAGNOSIS — Z9049 Acquired absence of other specified parts of digestive tract: Secondary | ICD-10-CM | POA: Insufficient documentation

## 2018-09-30 DIAGNOSIS — M47816 Spondylosis without myelopathy or radiculopathy, lumbar region: Secondary | ICD-10-CM | POA: Diagnosis not present

## 2018-09-30 DIAGNOSIS — G894 Chronic pain syndrome: Secondary | ICD-10-CM | POA: Diagnosis not present

## 2018-09-30 DIAGNOSIS — K449 Diaphragmatic hernia without obstruction or gangrene: Secondary | ICD-10-CM | POA: Insufficient documentation

## 2018-09-30 DIAGNOSIS — E538 Deficiency of other specified B group vitamins: Secondary | ICD-10-CM | POA: Insufficient documentation

## 2018-09-30 DIAGNOSIS — F17210 Nicotine dependence, cigarettes, uncomplicated: Secondary | ICD-10-CM | POA: Insufficient documentation

## 2018-09-30 DIAGNOSIS — J449 Chronic obstructive pulmonary disease, unspecified: Secondary | ICD-10-CM | POA: Insufficient documentation

## 2018-09-30 DIAGNOSIS — R590 Localized enlarged lymph nodes: Secondary | ICD-10-CM | POA: Insufficient documentation

## 2018-09-30 DIAGNOSIS — F329 Major depressive disorder, single episode, unspecified: Secondary | ICD-10-CM | POA: Diagnosis not present

## 2018-09-30 DIAGNOSIS — I2510 Atherosclerotic heart disease of native coronary artery without angina pectoris: Secondary | ICD-10-CM | POA: Insufficient documentation

## 2018-09-30 DIAGNOSIS — F419 Anxiety disorder, unspecified: Secondary | ICD-10-CM | POA: Insufficient documentation

## 2018-09-30 DIAGNOSIS — E039 Hypothyroidism, unspecified: Secondary | ICD-10-CM | POA: Insufficient documentation

## 2018-09-30 DIAGNOSIS — N390 Urinary tract infection, site not specified: Secondary | ICD-10-CM | POA: Diagnosis not present

## 2018-09-30 DIAGNOSIS — M47812 Spondylosis without myelopathy or radiculopathy, cervical region: Secondary | ICD-10-CM

## 2018-09-30 DIAGNOSIS — M48061 Spinal stenosis, lumbar region without neurogenic claudication: Secondary | ICD-10-CM | POA: Insufficient documentation

## 2018-09-30 DIAGNOSIS — E782 Mixed hyperlipidemia: Secondary | ICD-10-CM | POA: Diagnosis not present

## 2018-09-30 DIAGNOSIS — M533 Sacrococcygeal disorders, not elsewhere classified: Secondary | ICD-10-CM | POA: Insufficient documentation

## 2018-09-30 DIAGNOSIS — G5600 Carpal tunnel syndrome, unspecified upper limb: Secondary | ICD-10-CM | POA: Insufficient documentation

## 2018-09-30 MED ORDER — MAGNESIUM OXIDE 400 MG PO TABS: 400.0000 mg | ORAL_TABLET | Freq: Two times a day (BID) | ORAL | 0 refills | Status: DC

## 2018-09-30 MED ORDER — OXYCODONE HCL 5 MG PO TABS: 5.0000 mg | ORAL_TABLET | Freq: Four times a day (QID) | ORAL | 0 refills | Status: DC | PRN

## 2018-09-30 MED ORDER — VITAMIN D3 125 MCG (5000 UT) PO TABS: 1.0000 | ORAL_TABLET | Freq: Every day | ORAL | 2 refills | Status: DC

## 2018-09-30 MED ORDER — TIZANIDINE HCL 2 MG PO CAPS: 2.0000 mg | ORAL_CAPSULE | Freq: Three times a day (TID) | ORAL | 2 refills | Status: DC | PRN

## 2018-09-30 NOTE — Patient Instructions (Signed)
____________________________________________________________________________________________  Medication Rules  Applies to: All patients receiving prescriptions (written or electronic).  Pharmacy of record: Pharmacy where electronic prescriptions will be sent. If written prescriptions are taken to a different pharmacy, please inform the nursing staff. The pharmacy listed in the electronic medical record should be the one where you would like electronic prescriptions to be sent.  Prescription refills: Only during scheduled appointments. Applies to both, written and electronic prescriptions.  NOTE: The following applies primarily to controlled substances (Opioid* Pain Medications).   Patient's responsibilities: 1. Pain Pills: Bring all pain pills to every appointment (except for procedure appointments). 2. Pill Bottles: Bring pills in original pharmacy bottle. Always bring newest bottle. Bring bottle, even if empty. 3. Medication refills: You are responsible for knowing and keeping track of what medications you need refilled. The day before your appointment, write a list of all prescriptions that need to be refilled. Bring that list to your appointment and give it to the admitting nurse. Prescriptions will be written only during appointments. If you forget a medication, it will not be "Called in", "Faxed", or "electronically sent". You will need to get another appointment to get these prescribed. 4. Prescription Accuracy: You are responsible for carefully inspecting your prescriptions before leaving our office. Have the discharge nurse carefully go over each prescription with you, before taking them home. Make sure that your name is accurately spelled, that your address is correct. Check the name and dose of your medication to make sure it is accurate. Check the number of pills, and the written instructions to make sure they are clear and accurate. Make sure that you are given enough medication to last  until your next medication refill appointment. 5. Taking Medication: Take medication as prescribed. Never take more pills than instructed. Never take medication more frequently than prescribed. Taking less pills or less frequently is permitted and encouraged, when it comes to controlled substances (written prescriptions).  6. Inform other Doctors: Always inform, all of your healthcare providers, of all the medications you take. 7. Pain Medication from other Providers: You are not allowed to accept any additional pain medication from any other Doctor or Healthcare provider. There are two exceptions to this rule. (see below) In the event that you require additional pain medication, you are responsible for notifying us, as stated below. 8. Medication Agreement: You are responsible for carefully reading and following our Medication Agreement. This must be signed before receiving any prescriptions from our practice. Safely store a copy of your signed Agreement. Violations to the Agreement will result in no further prescriptions. (Additional copies of our Medication Agreement are available upon request.) 9. Laws, Rules, & Regulations: All patients are expected to follow all Federal and State Laws, Statutes, Rules, & Regulations. Ignorance of the Laws does not constitute a valid excuse. The use of any illegal substances is prohibited. 10. Adopted CDC guidelines & recommendations: Target dosing levels will be at or below 60 MME/day. Use of benzodiazepines** is not recommended.  Exceptions: There are only two exceptions to the rule of not receiving pain medications from other Healthcare Providers. 1. Exception #1 (Emergencies): In the event of an emergency (i.e.: accident requiring emergency care), you are allowed to receive additional pain medication. However, you are responsible for: As soon as you are able, call our office [phone], at any time of the day or night, and leave a message stating your name, the  date and nature of the emergency, and the name and dose of the medication   prescribed. In the event that your call is answered by a member of our staff, make sure to document and save the date, time, and the name of the person that took your information.  2. Exception #2 (Planned Surgery): In the event that you are scheduled by another doctor or dentist to have any type of surgery or procedure, you are allowed (for a period no longer than 30 days), to receive additional pain medication, for the acute post-op pain. However, in this case, you are responsible for picking up a copy of our "Post-op Pain Management for Surgeons" handout, and giving it to your surgeon or dentist. This document is available at our office, and does not require an appointment to obtain it. Simply go to our office during business hours (Monday-Thursday from 8:00 AM to 4:00 PM) (Friday 8:00 AM to 12:00 Noon) or if you have a scheduled appointment with us, prior to your surgery, and ask for it by name. In addition, you will need to provide us with your name, name of your surgeon, type of surgery, and date of procedure or surgery.  *Opioid medications include: morphine, codeine, oxycodone, oxymorphone, hydrocodone, hydromorphone, meperidine, tramadol, tapentadol, buprenorphine, fentanyl, methadone. **Benzodiazepine medications include: diazepam (Valium), alprazolam (Xanax), clonazepam (Klonopine), lorazepam (Ativan), clorazepate (Tranxene), chlordiazepoxide (Librium), estazolam (Prosom), oxazepam (Serax), temazepam (Restoril), triazolam (Halcion) (Last updated: 01/17/2018) ____________________________________________________________________________________________    

## 2018-09-30 NOTE — Progress Notes (Signed)
Patient's Name: [name]  MRN: [id number]  Referring Provider: Albina Billet, MD  DOB: 1945/11/21  PCP: Albina Billet, MD  DOS: 09/30/2018  Note by: Vevelyn Francois NP  Service setting: Ambulatory outpatient  Specialty: Interventional Pain Management  Location: [facility] (AMB) Pain Management Facility    Patient type: Established    Primary Reason(s) for Visit: Encounter for prescription drug management. (Level of risk: moderate)  CC: Back Pain  HPI  [name] is a 72 y.o. year old, female patient, who comes today for a medication management evaluation. 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); Cervical facet syndrome (Bilateral) (R>L); Cervical myofascial pain syndrome; Lumbar spondylosis; Chronic shoulder impingement syndrome (Right); Low magnesium levels; CRP elevated; Elevated sedimentation rate; Chronic obstructive pulmonary disease (COPD) ([place]); Nicotine dependence; Chronic shoulder pain (Right); Abnormal nerve conduction studies; Chronic shoulder pain (Bilateral) (status post multiple surgeries) (R>L); Cervical facet hypertrophy (Bilateral); History of shoulder surgery 5 (Right); Lumbar foraminal stenosis (L3-4) (Left); 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 Her primarily concern today is the Back Pain  Pain Assessment: Location: Right Back Radiating: pain radiaties down right side Onset: More than a month ago Duration: Chronic pain Quality: Aching, Burning, Throbbing Severity: 8 /10 (subjective, self-reported pain score)  Note: Reported level is compatible with observation. Clinically the patient looks like a 3/10 A 3/10 is viewed as "Moderate" and described as significantly interfering with activities of daily living (ADL). It becomes difficult to feed, bathe, get dressed, get on and off the toilet or to perform personal hygiene functions. Difficult to get in and out of bed or a chair without assistance. Very distracting. With effort, it can be ignored when deeply involved in activities. Information on the proper use of the pain scale provided to the patient today. When using our objective Pain Scale, levels between 6 and 10/10 are said to belong in an emergency room, as it progressively worsens from a 6/10, described as severely limiting, requiring emergency care not usually available at an outpatient pain management facility. At a 6/10 level, communication becomes difficult and requires great effort. Assistance to reach the emergency department may be required. Facial flushing and profuse sweating along with potentially dangerous increases in heart rate and blood pressure will be evident. Effect on ADL: unable to do anything Timing: Constant Modifying factors: medications BP: (!) 160/95  HR: (!) 109  [name] was last scheduled for an appointment on 08/22/2018 for medication management. During today's appointment we reviewed [name] chronic pain status, as well as her outpatient  medication regimen.  She is status post kyphoplasty.  She admits that this has not helped her back pain.  She admits that she will  give it a few more weeks.  She is currently in physical therapy  The patient  reports that she does not use drugs. Her body mass index is 22.66 kg/m.  Further details on both, my assessment(s), as well as the proposed treatment plan, please see below.  Controlled Substance Pharmacotherapy Assessment REMS (Risk Evaluation and Mitigation Strategy)  Analgesic:Oxycodone 71m QID MME/day:372mday. BrChauncey FischerRN  09/30/2018 12:58 PM  Sign at close encounter Nursing Pain Medication Assessment:  Safety precautions to be maintained throughout the outpatient stay will include: orient to surroundings, keep bed in low position, maintain call bell within reach at all times, provide assistance with transfer out of bed and ambulation.  Medication Inspection Compliance: [name] not comply with our request to bring her pills to be counted. She was reminded that bringing the medication bottles, even when empty, is a requirement.  Medication: None brought in. Pill/Patch Count: None available to be counted. Bottle Appearance: No container available. Did not bring bottle(s) to appointment. Filled Date: N/A Last Medication intake:  throw out the bottle by mistake   Pharmacokinetics: Liberation and absorption (onset of action): WNL Distribution (time to peak effect): WNL Metabolism and excretion (duration of action): WNL         Pharmacodynamics: Desired effects: Analgesia: Ms. PaLukeharteports >50% benefit. Functional ability: Patient reports that medication allows her to accomplish basic ADLs Clinically meaningful improvement in function (CMIF): Sustained CMIF goals met Perceived effectiveness: Described as relatively effective, allowing for increase in activities of daily living (ADL) Undesirable effects: Side-effects or Adverse reactions: None  reported Monitoring: [place] PMP: Online review of the past 1241-monthriod conducted. Compliant with practice rules and regulations Last UDS on record: Summary  Date Value Ref Range Status  03/28/2018 FINAL  Final    Comment:    ==================================================================== TOXASSURE SELECT 13 (MW) ==================================================================== Test                             Result       Flag       Units Drug Present and Declared for Prescription Verification   Alprazolam                     103          EXPECTED   ng/mg creat   Alpha-hydroxyalprazolam        >717         EXPECTED   ng/mg creat    Source of alprazolam is a scheduled prescription medication.    Alpha-hydroxyalprazolam is an expected metabolite of alprazolam.   Oxycodone                      33           EXPECTED   ng/mg creat   Oxymorphone                    34           EXPECTED   ng/mg creat   Noroxycodone                   1035         EXPECTED   ng/mg creat   Noroxymorphone                 39           EXPECTED   ng/mg  creat    Sources of oxycodone are scheduled prescription medications.    Oxymorphone, noroxycodone, and noroxymorphone are expected    metabolites of oxycodone. Oxymorphone is also available as a    scheduled prescription medication. ==================================================================== Test                      Result    Flag   Units      Ref Range   Creatinine              279              mg/dL      >=20 ==================================================================== Declared Medications:  The flagging and interpretation on this report are based on the  following declared medications.  Unexpected results may arise from  inaccuracies in the declared medications.  **Note: The testing scope of this panel includes these medications:  Alprazolam (Xanax)  Oxycodone (Oxy-IR)  Oxycodone (Roxicodone)  **Note: The testing scope of this  panel does not include following  reported medications:  Albuterol (Combivent)  Bupropion (Wellbutrin)  Escitalopram (Lexapro)  Fluticasone (Advair)  Ipratropium (Combivent)  Levothyroxine  Magnesium (Mag-Ox)  Salmeterol (Advair)  Tizanidine (Zanaflex)  Trazodone (Desyrel)  Vitamin D3 ==================================================================== For clinical consultation, please call [phone]. ====================================================================    UDS interpretation: Compliant          Medication Assessment Form: Reviewed. Patient indicates being compliant with therapy Treatment compliance: Compliant Risk Assessment Profile: Aberrant behavior: See prior evaluations. None observed or detected today Comorbid factors increasing risk of overdose: See prior notes. No additional risks detected today Opioid risk tool (ORT) (Total Score): 1 Personal History of Substance Abuse (SUD-Substance use disorder):  Alcohol: Negative  Illegal Drugs: Negative  Rx Drugs: Negative  ORT Risk Level calculation: Low Risk Risk of substance use disorder (SUD): Low Opioid Risk Tool - 09/30/18 1310      Family History of Substance Abuse   Alcohol  Negative    Illegal Drugs  Negative    Rx Drugs  Negative      Personal History of Substance Abuse   Alcohol  Negative    Illegal Drugs  Negative    Rx Drugs  Negative      Age   Age between 21-45 years   No      History of Preadolescent Sexual Abuse   History of Preadolescent Sexual Abuse  Negative or Female      Psychological Disease   Psychological Disease  Negative    Depression  Positive      Total Score   Opioid Risk Tool Scoring  1    Opioid Risk Interpretation  Low Risk      ORT Scoring interpretation table:  Score <3 = Low Risk for SUD  Score between 4-7 = Moderate Risk for SUD  Score >8 = High Risk for Opioid Abuse   Risk Mitigation Strategies:  Patient Counseling: Covered Patient-Prescriber  Agreement (PPA): Present and active  Notification to other healthcare providers: Done  Pharmacologic Plan: No change in therapy, at this time.             Laboratory Chemistry  Inflammation Markers (CRP: Acute Phase) (ESR: Chronic Phase) Lab Results  Component Value Date   CRP 14.3 (H) 06/05/2017   ESRSEDRATE 9 06/05/2017                         Rheumatology Markers Lab Results  Component Value Date  LABURIC 3.7 10/27/2008                        Renal Function Markers Lab Results  Component Value Date   BUN 10 08/20/2018   CREATININE 0.63 08/20/2018   BCR 21 06/05/2017   GFRAA >60 08/20/2018   GFRNONAA >60 08/20/2018                             Hepatic Function Markers Lab Results  Component Value Date   AST 18 08/17/2018   ALT 25 08/17/2018   ALBUMIN 3.5 08/20/2018   ALKPHOS 139 (H) 08/17/2018                        Electrolytes Lab Results  Component Value Date   NA 141 08/20/2018   K 3.4 (L) 08/21/2018   CL 107 08/20/2018   CALCIUM 9.0 08/20/2018   MG 1.6 (L) 08/21/2018   PHOS 3.4 08/20/2018                        Neuropathy Markers Lab Results  Component Value Date   VITAMINB12 282 06/05/2017                        CNS Tests No results found for: COLORCSF, APPEARCSF, RBCCOUNTCSF, WBCCSF, POLYSCSF, LYMPHSCSF, EOSCSF, PROTEINCSF, GLUCCSF, JCVIRUS, CSFOLI, IGGCSF                      Bone Pathology Markers Lab Results  Component Value Date   25OHVITD1 28 (L) 06/05/2017   25OHVITD2 3.0 06/05/2017   25OHVITD3 25 06/05/2017                         Coagulation Parameters Lab Results  Component Value Date   INR 1.08 10/01/2016   LABPROT 14.0 10/01/2016   PLT 303 08/19/2018                        Cardiovascular Markers Lab Results  Component Value Date   BNP 98.0 08/17/2018   CKTOTAL 372 (H) 10/01/2016   TROPONINI <0.03 08/17/2018   HGB 12.4 08/19/2018   HCT 36.1 08/19/2018                         CA Markers No results found for: CEA,  CA125, LABCA2                      Note: Lab results reviewed.  Recent Diagnostic Imaging Results  DG C-Arm 1-60 Min CLINICAL DATA:  72 year old female. L3 kyphoplasty. Subsequent encounter.  EXAM: DG C-ARM 61-120 MIN; LUMBAR SPINE - 2-3 VIEW  Fluoroscopic time 1 minutes 19 seconds.  COMPARISON:  08/18/2018 MR.  FINDINGS: Two C-arm images submitted for review after surgery. This reveals injection of radiopaque material into a vertebra. Adequate level assignment not possible as lumbosacral junction not included on present exam. This can be assessed on follow-up.  IMPRESSION: Post cement augmentation of vertebra.  Please see above discussion.  Electronically Signed   By: Genia Del M.D.   On: 08/20/2018 13:48 DG Lumbar Spine 2-3 Views CLINICAL DATA:  72 year old female. L3 kyphoplasty. Subsequent encounter.  EXAM: DG C-ARM 61-120 MIN; LUMBAR SPINE - 2-3 VIEW  Fluoroscopic  time 1 minutes 19 seconds.  COMPARISON:  08/18/2018 MR.  FINDINGS: Two C-arm images submitted for review after surgery. This reveals injection of radiopaque material into a vertebra. Adequate level assignment not possible as lumbosacral junction not included on present exam. This can be assessed on follow-up.  IMPRESSION: Post cement augmentation of vertebra.  Please see above discussion.  Electronically Signed   By: Genia Del M.D.   On: 08/20/2018 13:48  Complexity Note: Imaging results reviewed. Results shared with Ms. Brazeau, using Layman's terms.                         Meds   Current Outpatient Medications:  .  ALPRAZolam (XANAX) 1 MG tablet, Take 1 tablet (1 mg total) by mouth 4 (four) times daily as needed. (Patient taking differently: Take 1 mg by mouth 2 (two) times daily as needed. ), Disp: 30 tablet, Rfl: 0 .  amLODipine (NORVASC) 5 MG tablet, Take 1 tablet (5 mg total) by mouth daily., Disp: 30 tablet, Rfl: 1 .  buPROPion (WELLBUTRIN SR) 150 MG 12 hr tablet, Take 150 mg by  mouth daily. , Disp: , Rfl:  .  Cholecalciferol (VITAMIN D3) 125 MCG (5000 UT) TABS, Take 1 tablet (5,000 Units total) by mouth daily., Disp: 30 tablet, Rfl: 2 .  docusate sodium (COLACE) 100 MG capsule, Take 1 capsule (100 mg total) by mouth 2 (two) times daily., Disp: 60 capsule, Rfl: 0 .  escitalopram (LEXAPRO) 10 MG tablet, Take 10 mg by mouth daily. , Disp: , Rfl:  .  Fluticasone-Salmeterol (ADVAIR) 100-50 MCG/DOSE AEPB, Inhale 1 puff into the lungs every 12 (twelve) hours., Disp: , Rfl:  .  HYDROcodone-acetaminophen (NORCO/VICODIN) 5-325 MG tablet, Take 1-2 tablets by mouth every 4 (four) hours as needed for moderate pain., Disp: 30 tablet, Rfl: 0 .  Ipratropium-Albuterol (COMBIVENT RESPIMAT) 20-100 MCG/ACT AERS respimat, Inhale 1 puff into the lungs every 6 (six) hours as needed for wheezing or shortness of breath., Disp: , Rfl:  .  levothyroxine (SYNTHROID, LEVOTHROID) 88 MCG tablet, Take 88 mcg by mouth daily before breakfast. , Disp: , Rfl:  .  traZODone (DESYREL) 100 MG tablet, Take 1 tablet (100 mg total) by mouth at bedtime., Disp: 30 tablet, Rfl: 2 .  magnesium oxide (MAG-OX) 400 MG tablet, Take 1 tablet (400 mg total) by mouth 2 (two) times daily., Disp: 180 tablet, Rfl: 0 .  [START ON 11/29/2018] oxyCODONE (OXY IR/ROXICODONE) 5 MG immediate release tablet, Take 1 tablet (5 mg total) by mouth every 6 (six) hours as needed for severe pain., Disp: 120 tablet, Rfl: 0 .  [START ON 10/30/2018] oxyCODONE (OXY IR/ROXICODONE) 5 MG immediate release tablet, Take 1 tablet (5 mg total) by mouth every 6 (six) hours as needed for severe pain., Disp: 120 tablet, Rfl: 0 .  oxyCODONE (OXY IR/ROXICODONE) 5 MG immediate release tablet, Take 1 tablet (5 mg total) by mouth every 6 (six) hours as needed for severe pain., Disp: 120 tablet, Rfl: 0 .  tizanidine (ZANAFLEX) 2 MG capsule, Take 1 capsule (2 mg total) by mouth 3 (three) times daily as needed for muscle spasms., Disp: 90 capsule, Rfl: 2  ROS   Constitutional: Denies any fever or chills Gastrointestinal: No reported hemesis, hematochezia, vomiting, or acute GI distress Musculoskeletal: Denies any acute onset joint swelling, redness, loss of ROM, or weakness Neurological: No reported episodes of acute onset apraxia, aphasia, dysarthria, agnosia, amnesia, paralysis, loss of coordination, or loss of  consciousness  Allergies  Ms. Gasparyan has No Known Allergies.  [place]  Drug: Ms. Sorrels  reports that she does not use drugs. Alcohol:  reports that she does not drink alcohol. Tobacco:  reports that she has been smoking cigarettes. She has been smoking about 0.50 packs per day. She has never used smokeless tobacco. Medical:  has a past medical history of Anxiety, CAD (coronary artery disease), Carpal tunnel syndrome, Cataract, Complication of anesthesia, COPD (chronic obstructive pulmonary disease) ([place]), CRP elevated (09/14/2015), Depression, Difficult intubation, Dyslipidemia, Dyspnea, Dysrhythmia, Elevated sedimentation rate (09/14/2015), Esophageal spasm, Gastrointestinal parasites, GERD (gastroesophageal reflux disease), Hiatal hernia, History of peptic ulcer disease, Hyperthyroidism, Hypothyroidism, Low magnesium levels (09/14/2015), Pelvic fracture ([place]) (2008), Reflux, Rotator cuff injury, and Stenosis, spinal, lumbar. Surgical: Ms. Offner  has a past surgical history that includes Cesarean section; Neck surgery; Cholecystectomy; Carpal tunnel release; Rotator cuff repair; Shoulder arthroscopy (12/07/2011); Shoulder surgery (12/07/2011); Intramedullary (im) nail intertrochanteric (Right, 10/01/2016); Fracture surgery; Appendectomy; Nose surgery; Back surgery; Cataract extraction w/PHACO (Right, 08/07/2017); Hip surgery; Cataract extraction w/PHACO (Left, 09/04/2017); and Kyphoplasty (N/A, 08/20/2018). Family: family history includes Aneurysm in her unknown relative.  Constitutional Exam  General appearance: Well nourished, well developed, and well  hydrated. In no apparent acute distress Vitals:   09/30/18 1258  BP: (!) 160/95  Pulse: (!) 109  Resp: (!) 93  Temp: 98.2 F (36.8 C)  Weight: 132 lb (59.9 kg)  Height: 5' 4"  (1.626 m)  Psych/Mental status: Alert, oriented x 3 (person, place, & time)       Eyes: PERLA Respiratory: No evidence of acute respiratory distress  Lumbar Spine Area Exam  Skin & Axial Inspection: Well healed scar from previous spine surgery detected Alignment: Symmetrical Functional ROM: Unrestricted ROM       Stability: No instability detected Muscle Tone/Strength: Functionally intact. No obvious neuro-muscular anomalies detected. Sensory (Neurological): Unimpaired Palpation: No palpable anomalies       Provocative Tests: Hyperextension/rotation test: deferred today       Lumbar quadrant test (Kemp's test): deferred today       Lateral bending test: deferred today       Patrick's Maneuver: deferred today                   FABER test: deferred today                   S-I anterior distraction/compression test: deferred today         S-I lateral compression test: deferred today         S-I Thigh-thrust test: deferred today         S-I Gaenslen's test: deferred today          Gait & Posture Assessment  Ambulation: Patient came in today in a wheel chair Gait: Relatively normal for age and body habitus Posture: WNL   Lower Extremity Exam    Side: Right lower extremity  Side: Left lower extremity  Stability: No instability observed          Stability: No instability observed          Skin & Extremity Inspection: Skin color, temperature, and hair growth are WNL. No peripheral edema or cyanosis. No masses, redness, swelling, asymmetry, or associated skin lesions. No contractures.  Skin & Extremity Inspection: Skin color, temperature, and hair growth are WNL. No peripheral edema or cyanosis. No masses, redness, swelling, asymmetry, or associated skin lesions. No contractures.  Functional ROM: Unrestricted ROM  Functional ROM: Unrestricted ROM                  Muscle Tone/Strength: Functionally intact. No obvious neuro-muscular anomalies detected.  Muscle Tone/Strength: Functionally intact. No obvious neuro-muscular anomalies detected.  Sensory (Neurological): Unimpaired  Sensory (Neurological): Unimpaired  Palpation: No palpable anomalies  Palpation: No palpable anomalies   Assessment  Primary Diagnosis & Pertinent Problem List: The primary encounter diagnosis was Cervical spondylosis. Diagnoses of Lumbar spondylosis, Myofascial pain syndrome, cervical, Chronic pain syndrome, Low magnesium levels, and Long term prescription opiate use were also pertinent to this visit.  Status Diagnosis  Controlled Persistent Persistent 1. Cervical spondylosis   2. Lumbar spondylosis   3. Myofascial pain syndrome, cervical   4. Chronic pain syndrome   5. Low magnesium levels   6. Long term prescription opiate use     Problems updated and reviewed during this visit: Problem  Compression Fracture of L3 Vertebra (Hcc)   Plan of Care  Pharmacotherapy (Medications Ordered): Meds ordered this encounter  Medications  . magnesium oxide (MAG-OX) 400 MG tablet    Sig: Take 1 tablet (400 mg total) by mouth 2 (two) times daily.    Dispense:  180 tablet    Refill:  0    Do not place medication on "Automatic Refill". Fill one day early if pharmacy is closed on scheduled refill date.    Order Specific Question:   Supervising Provider    Answer:   Milinda Pointer [phone]  . tizanidine (ZANAFLEX) 2 MG capsule    Sig: Take 1 capsule (2 mg total) by mouth 3 (three) times daily as needed for muscle spasms.    Dispense:  90 capsule    Refill:  2    Do not place this medication, or any other prescription from our practice, on "Automatic Refill". Patient may have prescription filled one day early if pharmacy is closed on scheduled refill date.    Order Specific Question:   Supervising Provider     Answer:   Milinda Pointer [phone]  . Cholecalciferol (VITAMIN D3) 125 MCG (5000 UT) TABS    Sig: Take 1 tablet (5,000 Units total) by mouth daily.    Dispense:  30 tablet    Refill:  2    Order Specific Question:   Supervising Provider    Answer:   Milinda Pointer [phone]  . oxyCODONE (OXY IR/ROXICODONE) 5 MG immediate release tablet    Sig: Take 1 tablet (5 mg total) by mouth every 6 (six) hours as needed for severe pain.    Dispense:  120 tablet    Refill:  0    Do not add this medication to the electronic "Automatic Refill" notification system. Patient may have prescription filled one day early if pharmacy is closed on scheduled refill date.    Order Specific Question:   Supervising Provider    Answer:   Milinda Pointer [phone]  . oxyCODONE (OXY IR/ROXICODONE) 5 MG immediate release tablet    Sig: Take 1 tablet (5 mg total) by mouth every 6 (six) hours as needed for severe pain.    Dispense:  120 tablet    Refill:  0    Do not add this medication to the electronic "Automatic Refill" notification system. Patient may have prescription filled one day early if pharmacy is closed on scheduled refill date.    Order Specific Question:   Supervising Provider    Answer:   Milinda Pointer [phone]  . oxyCODONE (OXY IR/ROXICODONE) 5 MG  immediate release tablet    Sig: Take 1 tablet (5 mg total) by mouth every 6 (six) hours as needed for severe pain.    Dispense:  120 tablet    Refill:  0    Do not add this medication to the electronic "Automatic Refill" notification system. Patient may have prescription filled one day early if pharmacy is closed on scheduled refill date.    Order Specific Question:   Supervising Provider    Answer:   Milinda Pointer [[id number]]   New Prescriptions   No medications on file   Medications administered today: Nailani A. Herbers had no medications administered during this visit. Lab-work, procedure(s), and/or referral(s): Orders Placed This Encounter   Procedures  . ToxASSURE Select 13 (MW), Urine   Imaging and/or referral(s): None  Interventional management options: Planned, scheduled, and/or pending: Not at this time.    Considering: Diagnostic right-sidedlumbarfacet + right-sided sacroiliac joint block#2 Diagnostic right-sided sacroiliac joint block  Possible right sided sacroiliac joint RFA Diagnostic right-sided lumbar facet block  Possible right-sided lumbar facet RFA Diagnostic bilateral cervical facetblock  Possible bilateral cervical facet radiofrequencyablation  Diagnostic right-sided cervical epidural steroid injection  Diagnostic bilateral intra-articular shoulderjoint injection  Diagnostic bilateral suprascapularnerve block  Possible bilateral suprascapular nerve radiofrequencyablation  Diagnostic left-sided L3-4 transforaminal epiduralsteroid injection  Diagnostic L3-4 versus L4-5 lumbar epiduralsteroid injection    Palliative PRN treatment(s): Palliative right-sided sacroiliac joint block Palliative right-sided lumbar facet block    Provider-requested follow-up: Return in about 3 months (around 12/31/2018) for MedMgmt.  Future Appointments  Date Time Provider [place]  12/26/2018  1:30 PM Vevelyn Francois, NP [facility] None   Primary Care Physician: Albina Billet, MD Location: [facility] Note by: Vevelyn Francois NP Date: 09/30/2018; Time: 3:57 PM  Pain Score Disclaimer: We use the NRS-11 scale. This is a self-reported, subjective measurement of pain severity with only modest accuracy. It is used primarily to identify changes within a particular patient. It must be understood that outpatient pain scales are significantly less accurate that those used for research, where they can be applied under ideal controlled circumstances with minimal exposure to variables. In reality, the score is likely to be a combination of pain intensity and pain affect,  where pain affect describes the degree of emotional arousal or changes in action readiness caused by the sensory experience of pain. Factors such as social and work situation, setting, emotional state, anxiety levels, expectation, and prior pain experience may influence pain perception and show large inter-individual differences that may also be affected by time variables.  Patient instructions provided during this appointment: Patient Instructions  ____________________________________________________________________________________________  Medication Rules  Applies to: All patients receiving prescriptions (written or electronic).  Pharmacy of record: Pharmacy where electronic prescriptions will be sent. If written prescriptions are taken to a different pharmacy, please inform the nursing staff. The pharmacy listed in the electronic medical record should be the one where you would like electronic prescriptions to be sent.  Prescription refills: Only during scheduled appointments. Applies to both, written and electronic prescriptions.  NOTE: The following applies primarily to controlled substances (Opioid* Pain Medications).   Patient's responsibilities: 1. Pain Pills: Bring all pain pills to every appointment (except for procedure appointments). 2. Pill Bottles: Bring pills in original pharmacy bottle. Always bring newest bottle. Bring bottle, even if empty. 3. Medication refills: You are responsible for knowing and keeping track of what medications you need refilled. The day before your appointment, write a list  of all prescriptions that need to be refilled. Bring that list to your appointment and give it to the admitting nurse. Prescriptions will be written only during appointments. If you forget a medication, it will not be "Called in", "Faxed", or "electronically sent". You will need to get another appointment to get these prescribed. 4. Prescription Accuracy: You are responsible for  carefully inspecting your prescriptions before leaving our office. Have the discharge nurse carefully go over each prescription with you, before taking them home. Make sure that your name is accurately spelled, that your address is correct. Check the name and dose of your medication to make sure it is accurate. Check the number of pills, and the written instructions to make sure they are clear and accurate. Make sure that you are given enough medication to last until your next medication refill appointment. 5. Taking Medication: Take medication as prescribed. Never take more pills than instructed. Never take medication more frequently than prescribed. Taking less pills or less frequently is permitted and encouraged, when it comes to controlled substances (written prescriptions).  6. Inform other Doctors: Always inform, all of your healthcare providers, of all the medications you take. 7. Pain Medication from other Providers: You are not allowed to accept any additional pain medication from any other Doctor or Healthcare provider. There are two exceptions to this rule. (see below) In the event that you require additional pain medication, you are responsible for notifying us, as stated below. 8. Medication Agreement: You are responsible for carefully reading and following our Medication Agreement. This must be signed before receiving any prescriptions from our practice. Safely store a copy of your signed Agreement. Violations to the Agreement will result in no further prescriptions. (Additional copies of our Medication Agreement are available upon request.) 9. Laws, Rules, & Regulations: All patients are expected to follow all Federal and [employer], Rules, [employer]. Ignorance of the Laws does not constitute a valid excuse. The use of any illegal substances is prohibited. 10. Adopted CDC guidelines & recommendations: Target dosing levels will be at or below 60 MME/day. Use of benzodiazepines** is  not recommended.  Exceptions: There are only two exceptions to the rule of not receiving pain medications from other Healthcare Providers. 1. Exception #1 (Emergencies): In the event of an emergency (i.e.: accident requiring emergency care), you are allowed to receive additional pain medication. However, you are responsible for: As soon as you are able, call our office [phone], at any time of the day or night, and leave a message stating your name, the date and nature of the emergency, and the name and dose of the medication prescribed. In the event that your call is answered by a member of our staff, make sure to document and save the date, time, and the name of the person that took your information.  2. Exception #2 (Planned Surgery): In the event that you are scheduled by another doctor or dentist to have any type of surgery or procedure, you are allowed (for a period no longer than 30 days), to receive additional pain medication, for the acute post-op pain. However, in this case, you are responsible for picking up a copy of our "Post-op Pain Management for Surgeons" handout, and giving it to your surgeon or dentist. This document is available at our office, and does not require an appointment to obtain it. Simply go to our office during business hours (Monday-Thursday from 8:00 AM to 4:00 PM) (Friday 8:00 AM to 12:00 Noon) or  if you have a scheduled appointment with [place], prior to your surgery, and ask for it by name. In addition, you will need to provide [place] with your name, name of your surgeon, type of surgery, and date of procedure or surgery.  *Opioid medications include: morphine, codeine, oxycodone, oxymorphone, hydrocodone, hydromorphone, meperidine, tramadol, tapentadol, buprenorphine, fentanyl, methadone. **Benzodiazepine medications include: diazepam (Valium), alprazolam (Xanax), clonazepam (Klonopine), lorazepam (Ativan), clorazepate (Tranxene), chlordiazepoxide (Librium), estazolam  (Prosom), oxazepam (Serax), temazepam (Restoril), triazolam (Halcion) (Last updated: 01/17/2018) ____________________________________________________________________________________________

## 2018-09-30 NOTE — Progress Notes (Signed)
Nursing Pain Medication Assessment:  Safety precautions to be maintained throughout the outpatient stay will include: orient to surroundings, keep bed in low position, maintain call bell within reach at all times, provide assistance with transfer out of bed and ambulation.  Medication Inspection Compliance: [name] did not comply with our request to bring her pills to be counted. She was reminded that bringing the medication bottles, even when empty, is a requirement.  Medication: None brought in. Pill/Patch Count: None available to be counted. Bottle Appearance: No container available. Did not bring bottle(s) to appointment. Filled Date: N/A Last Medication intake:  throw out the bottle by mistake

## 2018-10-04 LAB — TOXASSURE SELECT 13 (MW), URINE

## 2018-10-15 DIAGNOSIS — I2510 Atherosclerotic heart disease of native coronary artery without angina pectoris: Secondary | ICD-10-CM | POA: Diagnosis not present

## 2018-10-15 DIAGNOSIS — N390 Urinary tract infection, site not specified: Secondary | ICD-10-CM | POA: Diagnosis not present

## 2018-10-15 DIAGNOSIS — J441 Chronic obstructive pulmonary disease with (acute) exacerbation: Secondary | ICD-10-CM | POA: Diagnosis not present

## 2018-10-21 ENCOUNTER — Emergency Department: Payer: Medicare HMO

## 2018-10-21 ENCOUNTER — Encounter: Payer: Self-pay | Admitting: Emergency Medicine

## 2018-10-21 ENCOUNTER — Other Ambulatory Visit: Payer: Self-pay

## 2018-10-21 ENCOUNTER — Inpatient Hospital Stay
Admission: EM | Admit: 2018-10-21 | Discharge: 2018-10-24 | DRG: 871 | Disposition: A | Discharge status: Home / Self Care | Payer: Medicare HMO | Attending: Internal Medicine | Admitting: Internal Medicine

## 2018-10-21 DIAGNOSIS — R0689 Other abnormalities of breathing: Secondary | ICD-10-CM | POA: Diagnosis not present

## 2018-10-21 DIAGNOSIS — F419 Anxiety disorder, unspecified: Secondary | ICD-10-CM | POA: Diagnosis present

## 2018-10-21 DIAGNOSIS — R0602 Shortness of breath: Secondary | ICD-10-CM | POA: Diagnosis not present

## 2018-10-21 DIAGNOSIS — J440 Chronic obstructive pulmonary disease with acute lower respiratory infection: Secondary | ICD-10-CM | POA: Diagnosis present

## 2018-10-21 DIAGNOSIS — R05 Cough: Secondary | ICD-10-CM | POA: Diagnosis not present

## 2018-10-21 DIAGNOSIS — Z8701 Personal history of pneumonia (recurrent): Secondary | ICD-10-CM

## 2018-10-21 DIAGNOSIS — I2510 Atherosclerotic heart disease of native coronary artery without angina pectoris: Secondary | ICD-10-CM | POA: Diagnosis present

## 2018-10-21 DIAGNOSIS — G894 Chronic pain syndrome: Secondary | ICD-10-CM | POA: Diagnosis present

## 2018-10-21 DIAGNOSIS — Y95 Nosocomial condition: Secondary | ICD-10-CM | POA: Diagnosis present

## 2018-10-21 DIAGNOSIS — E039 Hypothyroidism, unspecified: Secondary | ICD-10-CM | POA: Diagnosis present

## 2018-10-21 DIAGNOSIS — F17210 Nicotine dependence, cigarettes, uncomplicated: Secondary | ICD-10-CM | POA: Diagnosis present

## 2018-10-21 DIAGNOSIS — Z79890 Hormone replacement therapy (postmenopausal): Secondary | ICD-10-CM

## 2018-10-21 DIAGNOSIS — F32A Depression, unspecified: Secondary | ICD-10-CM | POA: Diagnosis present

## 2018-10-21 DIAGNOSIS — K219 Gastro-esophageal reflux disease without esophagitis: Secondary | ICD-10-CM | POA: Diagnosis present

## 2018-10-21 DIAGNOSIS — J9601 Acute respiratory failure with hypoxia: Secondary | ICD-10-CM | POA: Diagnosis present

## 2018-10-21 DIAGNOSIS — Z981 Arthrodesis status: Secondary | ICD-10-CM

## 2018-10-21 DIAGNOSIS — A419 Sepsis, unspecified organism: Principal | ICD-10-CM | POA: Diagnosis present

## 2018-10-21 DIAGNOSIS — Z87440 Personal history of urinary (tract) infections: Secondary | ICD-10-CM

## 2018-10-21 DIAGNOSIS — J441 Chronic obstructive pulmonary disease with (acute) exacerbation: Secondary | ICD-10-CM | POA: Diagnosis present

## 2018-10-21 DIAGNOSIS — M7918 Myalgia, other site: Secondary | ICD-10-CM

## 2018-10-21 DIAGNOSIS — Z9841 Cataract extraction status, right eye: Secondary | ICD-10-CM

## 2018-10-21 DIAGNOSIS — Z9049 Acquired absence of other specified parts of digestive tract: Secondary | ICD-10-CM

## 2018-10-21 DIAGNOSIS — Z8711 Personal history of peptic ulcer disease: Secondary | ICD-10-CM

## 2018-10-21 DIAGNOSIS — F329 Major depressive disorder, single episode, unspecified: Secondary | ICD-10-CM | POA: Diagnosis present

## 2018-10-21 DIAGNOSIS — Z79899 Other long term (current) drug therapy: Secondary | ICD-10-CM

## 2018-10-21 DIAGNOSIS — R790 Abnormal level of blood mineral: Secondary | ICD-10-CM

## 2018-10-21 DIAGNOSIS — J189 Pneumonia, unspecified organism: Secondary | ICD-10-CM | POA: Diagnosis not present

## 2018-10-21 DIAGNOSIS — Z961 Presence of intraocular lens: Secondary | ICD-10-CM | POA: Diagnosis present

## 2018-10-21 DIAGNOSIS — Z9842 Cataract extraction status, left eye: Secondary | ICD-10-CM

## 2018-10-21 DIAGNOSIS — J449 Chronic obstructive pulmonary disease, unspecified: Secondary | ICD-10-CM | POA: Diagnosis present

## 2018-10-21 DIAGNOSIS — E785 Hyperlipidemia, unspecified: Secondary | ICD-10-CM | POA: Diagnosis present

## 2018-10-21 DIAGNOSIS — J80 Acute respiratory distress syndrome: Secondary | ICD-10-CM | POA: Diagnosis not present

## 2018-10-21 DIAGNOSIS — R0902 Hypoxemia: Secondary | ICD-10-CM | POA: Diagnosis not present

## 2018-10-21 LAB — URINALYSIS, COMPLETE (UACMP) WITH MICROSCOPIC
Bacteria, UA: NONE SEEN
Bilirubin Urine: NEGATIVE
Glucose, UA: NEGATIVE mg/dL
Hgb urine dipstick: NEGATIVE
Ketones, ur: 20 mg/dL — AB
Leukocytes, UA: NEGATIVE
Nitrite: NEGATIVE
PH: 6 (ref 5.0–8.0)
Protein, ur: NEGATIVE mg/dL
Specific Gravity, Urine: 1.029 (ref 1.005–1.030)

## 2018-10-21 LAB — COMPREHENSIVE METABOLIC PANEL
ALT: 20 U/L (ref 0–44)
AST: 25 U/L (ref 15–41)
Albumin: 4 g/dL (ref 3.5–5.0)
Alkaline Phosphatase: 101 U/L (ref 38–126)
Anion gap: 10 (ref 5–15)
BUN: 21 mg/dL (ref 8–23)
CO2: 29 mmol/L (ref 22–32)
Calcium: 9 mg/dL (ref 8.9–10.3)
Chloride: 104 mmol/L (ref 98–111)
Creatinine, Ser: 0.66 mg/dL (ref 0.44–1.00)
GFR calc Af Amer: >60 mL/min (ref >60)
GFR calc non Af Amer: >60 mL/min (ref >60)
Glucose, Bld: 115 mg/dL — ABNORMAL HIGH (ref 70–99)
Potassium: 3.3 mmol/L — ABNORMAL LOW (ref 3.5–5.1)
Sodium: 143 mmol/L (ref 135–145)
Total Bilirubin: 0.4 mg/dL (ref 0.3–1.2)
Total Protein: 7.5 g/dL (ref 6.5–8.1)

## 2018-10-21 LAB — CBC WITH DIFFERENTIAL/PLATELET
Abs Immature Granulocytes: 0.12 10*3/uL — ABNORMAL HIGH (ref 0.00–0.07)
Basophils Absolute: 0.1 10*3/uL (ref 0.0–0.1)
Basophils Relative: 1 %
Eosinophils Absolute: 0.1 10*3/uL (ref 0.0–0.5)
Eosinophils Relative: 1 %
HCT: 41.5 % (ref 36.0–46.0)
Hemoglobin: 13.1 g/dL (ref 12.0–15.0)
IMMATURE GRANULOCYTES: 1 %
Lymphocytes Relative: 17 %
Lymphs Abs: 2.8 10*3/uL (ref 0.7–4.0)
MCH: 29.6 pg (ref 26.0–34.0)
MCHC: 31.6 g/dL (ref 30.0–36.0)
MCV: 93.9 fL (ref 80.0–100.0)
Monocytes Absolute: 0.6 10*3/uL (ref 0.1–1.0)
Monocytes Relative: 4 %
NEUTROS ABS: 12.9 10*3/uL — AB (ref 1.7–7.7)
NEUTROS PCT: 76 %
Platelets: 523 10*3/uL — ABNORMAL HIGH (ref 150–400)
RBC: 4.42 MIL/uL (ref 3.87–5.11)
RDW: 13.3 % (ref 11.5–15.5)
WBC: 16.6 10*3/uL — ABNORMAL HIGH (ref 4.0–10.5)
nRBC: 0 % (ref 0.0–0.2)

## 2018-10-21 LAB — TROPONIN I
Troponin I: 0.03 ng/mL (ref <0.03)
Troponin I: <0.03 ng/mL (ref <0.03)

## 2018-10-21 LAB — LACTIC ACID, PLASMA: LACTIC ACID, VENOUS: 1.9 mmol/L (ref 0.5–1.9)

## 2018-10-21 MED ORDER — IOHEXOL 350 MG/ML SOLN
75.0000 mL | Freq: Once | INTRAVENOUS | Status: AC | PRN
  Administered 2018-10-21: 75 mL via INTRAVENOUS

## 2018-10-21 MED ORDER — SODIUM CHLORIDE 0.9 % IV SOLN
1.0000 g | Freq: Once | INTRAVENOUS | Status: AC
  Administered 2018-10-22: 1 g via INTRAVENOUS
  Filled 2018-10-21: qty 1

## 2018-10-21 MED ORDER — VANCOMYCIN HCL IN DEXTROSE 1-5 GM/200ML-% IV SOLN
1000.0000 mg | Freq: Once | INTRAVENOUS | Status: AC
  Administered 2018-10-22: 1000 mg via INTRAVENOUS
  Filled 2018-10-21: qty 200

## 2018-10-21 NOTE — ED Notes (Signed)
Patient transported to X-ray 

## 2018-10-21 NOTE — ED Triage Notes (Signed)
Pt to ED via EMS from home c/o SOB.  Per EMS pt discharged from hospital after admission for pneumonia with new 2L [place] oxygen and ABX.  Pt now with SOB worse with exertion, productive thick green/yellow phlegm.  Pt given a duoneb treatment, 1 albuterol, and 125mg  solumedrol en route.  Pt presents A&Ox4, speaking in complete and coherent sentences, chest rise even and unlabored, and in NAD at this time.

## 2018-10-21 NOTE — ED Provider Notes (Signed)
[facility] Emergency Department Provider Note  ____________________________________________   First MD Initiated Contact with Patient 10/21/18 2051     (approximate)  I have reviewed the triage vital signs and the nursing notes.   HISTORY  Chief Complaint Shortness of Breath and Cough   HPI [name] is a 72 y.o. female who presents to the emergency department for shortness of breath. She was discharged in October after a kyphoplasty.  She states that she subsequently developed a pneumonia.  Home health was coming to the house to see her and brought a "portable x-ray machine."  At that point she was diagnosed with pneumonia and states that she did complete a round of antibiotics.  She got better and felt at her baseline until this evening.  She states that she ambulated from the bathroom to her den and felt that she was completely exhausted.  She has been coughing up very thick mucus over the past few days but tonight was unable to cough anything else up.  She called EMS who felt that she probably needed to come here for evaluation.  In route, she was given a DuoNeb treatment, 1 albuterol treatment and 125 mg Solu-Medrol IV.  She has a significant past medical history of COPD and CAD.  She denies any chest pain or lower extremity swelling or pain.  Past Medical History:  Diagnosis Date  . Anxiety   . CAD (coronary artery disease)   . Carpal tunnel syndrome   . Cataract   . Complication of anesthesia    has woken up at times  . COPD (chronic obstructive pulmonary disease) ([place])   . CRP elevated 09/14/2015  . Depression   . Difficult intubation   . Dyslipidemia   . Dyspnea    DOE  . Dysrhythmia    hx palpatations  . Elevated sedimentation rate 09/14/2015  . Esophageal spasm   . Gastrointestinal parasites   . GERD (gastroesophageal reflux disease)   . Hiatal hernia   . History of peptic ulcer disease   . Hyperthyroidism   . Hypothyroidism   .  Low magnesium levels 09/14/2015  . Pelvic fracture ([place]) 2008   fall from riding a horse  . Reflux   . Rotator cuff injury   . Stenosis, spinal, lumbar     Patient Active Problem List   Diagnosis Date Noted  . Sepsis ([place]) 10/22/2018  . HCAP (healthcare-associated pneumonia) 10/22/2018  . Compression fracture of L3 vertebra ([place]) 08/19/2018  . Acute UTI 08/18/2018  . Chronic pain of both hips 12/31/2017  . Neurogenic pain 08/29/2017  . Chronic low back pain (Primary Area of Pain) (Right) 08/29/2017  . Chronic sacroiliac joint pain (Right) 06/05/2017  . Vitamin D insufficiency 03/12/2017  . Right hip pain 02/20/2017  . Intertrochanteric fracture of right hip, sequela 02/20/2017  . B12 deficiency 02/05/2017  . Pressure injury of skin 10/02/2016  . Closed displaced intertrochanteric fracture of right femur ([place]) 10/02/2016  . Hip fracture ([place]) 10/01/2016  . Cervical facet hypertrophy (Bilateral) 05/27/2016  . History of shoulder surgery 5 (Right) 05/27/2016  . Lumbar foraminal stenosis (L3-4) (Left) 05/27/2016  . 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 Lumbar facet hypertrophy (Bilateral) 05/27/2016  . Lumbar facet syndrome (Right) 05/27/2016  . Lumbar grade 1 Anterolisthesis of L3 over L4 05/27/2016  . Chronic shoulder pain (Bilateral) (status post multiple surgeries) (R>L) 12/08/2015  . Substance use disorder Risk: High 09/14/2015  . Chronic pain syndrome  09/14/2015  . Cervical spondylosis 09/14/2015  . Chronic neck pain (Secondary source of pain) (Bilateral) (R>L) 09/14/2015  . 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 Cervical facet syndrome (Bilateral) (R>L) 09/14/2015  . Cervical myofascial pain syndrome 09/14/2015  . Lumbar spondylosis 09/14/2015  . Chronic shoulder impingement syndrome (Right) 09/14/2015  . Low magnesium levels 09/14/2015  . CRP elevated 09/14/2015  . Elevated sedimentation rate  09/14/2015  . Chronic obstructive pulmonary disease (COPD) ([place]) 09/14/2015  . Nicotine dependence 09/14/2015  . Chronic shoulder pain (Right) 09/14/2015  . Abnormal nerve conduction studies 09/14/2015  . Encounter for therapeutic drug level monitoring 09/09/2015  . Long term current use of opiate analgesic 09/09/2015  . Long term prescription opiate use 09/09/2015  . Uncomplicated opioid dependence ([place]) 09/09/2015  . Opiate use 09/09/2015  . Anxiety and depression 07/11/2012  . Smoking 05/27/2011  . Fatigue 05/27/2011  . Hypothyroidism 11/25/2010  . HLD (hyperlipidemia) 08/24/2010  . Tachycardia 08/24/2010  . DYSPNEA 08/24/2010    Past Surgical History:  Procedure Laterality Date  . APPENDECTOMY    . BACK SURGERY     CERVICAL FUSION  . CARPAL TUNNEL RELEASE    . CATARACT EXTRACTION W/PHACO Right 08/07/2017   Procedure: CATARACT EXTRACTION PHACO AND INTRAOCULAR LENS PLACEMENT (IOC);  Surgeon: Birder Robson, MD;  Location: [facility] ORS;  Service: Ophthalmology;  Laterality: Right;  [place] 00:52.0 AP% 16.8 CDE 8.74 Fluid Pack Lot # [id number]  . CATARACT EXTRACTION W/PHACO Left 09/04/2017   Procedure: CATARACT EXTRACTION PHACO AND INTRAOCULAR LENS PLACEMENT (IOC);  Surgeon: Birder Robson, MD;  Location: [facility] ORS;  Service: Ophthalmology;  Laterality: Left;  [place] 00:34 AP% 17.0 CDE 5.80 Fluid pack lot # [id number]  . CESAREAN SECTION    . CHOLECYSTECTOMY    . FRACTURE SURGERY    . HIP SURGERY    . INTRAMEDULLARY (IM) NAIL INTERTROCHANTERIC Right 10/01/2016   Procedure: INTRAMEDULLARY (IM) NAIL INTERTROCHANTRIC;  Surgeon: Corky Mull, MD;  Location: [facility] ORS;  Service: Orthopedics;  Laterality: Right;  . KYPHOPLASTY N/A 08/20/2018   Procedure: [id number];  Surgeon: Hessie Knows, MD;  Location: [facility] ORS;  Service: Orthopedics;  Laterality: N/A;  . NECK SURGERY    . NOSE SURGERY    . ROTATOR CUFF REPAIR     x2  . SHOULDER ARTHROSCOPY  12/07/2011   Procedure: ARTHROSCOPY  SHOULDER;  Surgeon: Ninetta Lights, MD;  Location: [place];  Service: Orthopedics;  Laterality: Right;  Debridement Partial Cuff Tear, Release Coracoacromial Ligament  . SHOULDER SURGERY  12/07/2011   right    Prior to Admission medications   Medication Sig Start Date End Date Taking? Authorizing Provider  ALPRAZolam Duanne Moron) 1 MG tablet Take 1 tablet (1 mg total) by mouth 4 (four) times daily as needed. Patient taking differently: Take 1 mg by mouth 2 (two) times daily as needed.  10/04/16  Yes Epifanio Lesches, MD  amLODipine (NORVASC) 5 MG tablet Take 1 tablet (5 mg total) by mouth daily. 08/22/18  Yes [place] Lighter, MD  buPROPion (WELLBUTRIN SR) 150 MG 12 hr tablet Take 150 mg by mouth daily.    Yes [provider]  docusate sodium (COLACE) 100 MG capsule Take 1 capsule (100 mg total) by mouth 2 (two) times daily. Patient taking differently: Take 100 mg by mouth daily.  08/21/18  Yes [place] Lighter, MD  escitalopram (LEXAPRO) 10 MG tablet Take 10 mg by mouth daily.  10/15/13  Yes [provider]  Ipratropium-Albuterol (COMBIVENT RESPIMAT) 20-100 MCG/ACT AERS respimat Inhale 1 puff into the lungs every 6 (six) hours as needed for wheezing or shortness of breath.   Yes [provider]  levothyroxine (SYNTHROID, LEVOTHROID) 88 MCG tablet Take 88 mcg by mouth daily before breakfast.    Yes [provider]  magnesium oxide (MAG-OX) 400 MG tablet Take 1 tablet (400 mg total) by mouth 2 (two) times daily. 09/30/18 12/29/18 Yes King, Diona Foley, NP  montelukast (SINGULAIR) 10 MG tablet Take 10 mg by mouth daily.   Yes [provider]  oxyCODONE (OXY IR/ROXICODONE) 5 MG immediate release tablet Take 1 tablet (5 mg total) by mouth every 6 (six) hours as needed for severe pain. 09/30/18 10/30/18 Yes Vevelyn Francois, NP  tizanidine (ZANAFLEX) 2 MG capsule Take 1 capsule (2 mg total) by mouth 3 (three) times daily as needed for muscle  spasms. Patient taking differently: Take 2 mg by mouth daily.  09/30/18 12/29/18 Yes Vevelyn Francois, NP  traZODone (DESYREL) 100 MG tablet Take 1 tablet (100 mg total) by mouth at bedtime. 08/21/18  Yes [place] Lighter, MD  oxyCODONE (OXY IR/ROXICODONE) 5 MG immediate release tablet Take 1 tablet (5 mg total) by mouth every 6 (six) hours as needed for severe pain. 11/29/18 12/29/18  Vevelyn Francois, NP  oxyCODONE (OXY IR/ROXICODONE) 5 MG immediate release tablet Take 1 tablet (5 mg total) by mouth every 6 (six) hours as needed for severe pain. 10/30/18 11/29/18  Vevelyn Francois, NP    Allergies Patient has no known allergies.  Family History  Problem Relation Age of Onset  . Aneurysm Unknown     Social History Social History   Tobacco Use  . Smoking status: Current Every Day Smoker    Packs/day: 0.50    Types: Cigarettes  . Smokeless tobacco: Never Used  Substance Use Topics  . Alcohol use: No  . Drug use: No    Review of Systems  Constitutional: No fever/chills. Eyes: No visual changes. ENT: No sore throat. Cardiovascular: Negative for chest pain.  Negative for pleuritic pain.  Negative for palpitations.  Negative for leg pain. Respiratory: Positive for shortness of breath. Gastrointestinal: Negative for abdominal pain.  No nausea, no vomiting.  No diarrhea.  No constipation. Genitourinary: Negative for dysuria. Musculoskeletal: Negative for back pain.  Skin: Negative for rash, lesion, wound. Neurological: Negative for headaches, focal weakness or numbness. ___________________________________________   PHYSICAL EXAM:  VITAL SIGNS: ED Triage Vitals  Enc Vitals Group     BP 10/21/18 2031 (!) 175/95     Pulse Rate 10/21/18 2031 (!) 106     Resp 10/21/18 2031 (!) 22     Temp 10/21/18 2031 97.7 F (36.5 C)     Temp Source 10/21/18 2031 Oral     SpO2 10/21/18 2031 97 %     Weight 10/21/18 2033 131 lb 13.4 oz (59.8 kg)     Height 10/21/18 2033 5\' 4"  (1.626 m)     Head  Circumference --      Peak Flow --      Pain Score 10/21/18 2033 0     Pain Loc --      Pain Edu? --      Excl. in [place]? --     Constitutional: Alert and oriented.  Chronically ill appearing and in no acute distress.  Normal mental status. Eyes: Conjunctivae are normal. PERRL. Head: Atraumatic. Nose: No congestion/rhinnorhea. Mouth/Throat: Mucous membranes are moist.  Oropharynx non-erythematous. Tongue  normal in size and color. Neck: No stridor.  No carotid bruit appreciated on exam.  No JVD Hematological/Lymphatic/Immunilogical: No cervical lymphadenopathy. Cardiovascular: Normal rate, regular rhythm. Grossly normal heart sounds.  Good peripheral circulation. Respiratory: Normal respiratory effort.  No retractions. Lungs diminished throughout without wheeze, rale, or rhonchi Gastrointestinal: Soft and nontender. No distention. No abdominal bruits. No CVA tenderness. Genitourinary: Exam deferred. Musculoskeletal: No lower extremity tenderness.  No edema of extremities. Neurologic:  Normal speech and language. No gross focal neurologic deficits are appreciated. Skin:  Skin is warm, dry and intact. No rash noted. Psychiatric: Mood and affect are normal. Speech and behavior are normal.  ____________________________________________   LABS (all labs ordered are listed, but only abnormal results are displayed)  Labs Reviewed  COMPREHENSIVE METABOLIC PANEL - Abnormal; Notable for the following components:      Result Value   Potassium 3.3 (*)    Glucose, Bld 115 (*)    All other components within normal limits  CBC WITH DIFFERENTIAL/PLATELET - Abnormal; Notable for the following components:   WBC 16.6 (*)    Platelets 523 (*)    Neutro Abs 12.9 (*)    Abs Immature Granulocytes 0.12 (*)    All other components within normal limits  URINALYSIS, COMPLETE (UACMP) WITH MICROSCOPIC - Abnormal; Notable for the following components:   Color, Urine YELLOW (*)    APPearance CLEAR (*)     Ketones, ur 20 (*)    All other components within normal limits  TROPONIN I - Abnormal; Notable for the following components:   Troponin I 0.03 (*)    All other components within normal limits  CULTURE, BLOOD (ROUTINE X 2)  CULTURE, BLOOD (ROUTINE X 2)  LACTIC ACID, PLASMA  TROPONIN I  CBC  CREATININE, SERUM  BASIC METABOLIC PANEL  CBC   ____________________________________________  EKG  ED ECG REPORT I, Lewi Drost, FNP-BC personally viewed and interpreted this ECG.   Date: 10/21/2018  EKG Time: 8:25 PM  Rate: 107  Rhythm: sinus tachycardia  Axis: No axis deviation  Intervals:none  ST&T Change: no ST elevation  ____________________________________________  RADIOLOGY  ED MD interpretation:  Chest x-ray negative for acute abnormality per radiology.  CT rule out PE shows no pulmonary embolus however there is an airspace opacity in the posterior inferior right upper lobe which possibly represents an early pneumonia.  Official radiology report(s): Dg Chest 2 View  Result Date: 10/21/2018 CLINICAL DATA:  Productive cough EXAM: CHEST - 2 VIEW COMPARISON:  08/17/2018 FINDINGS: There is hyperinflation of the lungs compatible with COPD. Heart and mediastinal contours are within normal limits. No focal opacities or effusions. No acute bony abnormality. IMPRESSION: COPD.  No active disease. Electronically Signed   By: Rolm Baptise M.D.   On: 10/21/2018 21:16   Ct Angio Chest Pe W And/or Wo Contrast  Result Date: 10/21/2018 CLINICAL DATA:  Shortness of breath. Recent treatment for pneumonia. EXAM: CT ANGIOGRAPHY CHEST WITH CONTRAST TECHNIQUE: Multidetector CT imaging of the chest was performed using the standard protocol during bolus administration of intravenous contrast. Multiplanar CT image reconstructions and MIPs were obtained to evaluate the vascular anatomy. CONTRAST:  28mL OMNIPAQUE IOHEXOL 350 MG/ML SOLN COMPARISON:  Plain film earlier today. FINDINGS: Cardiovascular: Mild  cardiomegaly. Calcifications throughout the coronary arteries. Scattered aortic calcifications. No aneurysm. No filling defects in the pulmonary arteries to suggest pulmonary emboli. Mediastinum/Nodes: Mildly enlarged right paratracheal node measuring 11 mm. Borderline right hilar lymph nodes. Right hilar lymph node has a short axis  diameter of 10 mm. Small hiatal hernia. Lungs/Pleura: Moderate emphysema. Airspace opacity in the posterior right upper lobe could reflect early infiltrate or scarring. No effusions. Upper Abdomen: Prior cholecystectomy. Intrahepatic and extrahepatic biliary ductal dilatation. The common bile duct measures up to 12 mm. Fullness of the adrenal glands bilaterally. Musculoskeletal: Chest wall soft tissues are unremarkable. Mild compression through the the superior endplate of I29 and inferior endplate of T6. Review of the MIP images confirms the above findings. IMPRESSION: No evidence of pulmonary embolus. Mildly prominent mediastinal and right hilar lymph nodes. These may be reactive. These could be followed with repeat chest CT with IV contrast in 6 months to ensure stability or resolution. Airspace opacity in the posterior inferior right upper lobe could reflect scarring or early pneumonia. Intrahepatic and extrahepatic biliary ductal dilatation. This may be related to patient's age and post cholecystectomy state. Recommend correlation with LFTs. Aortic Atherosclerosis (ICD10-I70.0) and Emphysema (ICD10-J43.9). Electronically Signed   By: Rolm Baptise M.D.   On: 10/21/2018 23:22    ____________________________________________   PROCEDURES  Procedure(s) performed: None  Procedures  Critical Care performed: No  ____________________________________________   INITIAL IMPRESSION / ASSESSMENT AND PLAN / ED COURSE  As part of my medical decision making, I reviewed the following data within the electronic MEDICAL RECORD NUMBER Notes from prior ED visits  72 year old female  presenting to the emergency department for shortness of breath and cough that has gone from productive to nonproductive.  Patient has denied any chest pain or fever.  She was treated in October for pneumonia and feels that this is returned.  She is using her home oxygen, but on exam, the patient has the nasal cannula lying to the side of her face.  Her oxygen saturation is 97%.  Is requesting that she be evaluated then discharged home.  She states that she does not want to stay overnight.  ----------------------------------------- 9:57 PM on 10/21/2018 -----------------------------------------  Patient updated and plan was discussed. WBC is elevated at 16.6 with a left shift  in comparison with last draw in September. She was encouraged to provide a urine sample. Lactic acid and blood cultures ordered. Patient denies dysuria. Troponin is 0.03. Plan will also be to repeat the troponin in 3 hours.  ----------------------------------------- 11:50 PM on 10/21/2018 -----------------------------------------  Patient will be admitted given the fact that she has a developing pneumonia.  She continues to be very short of breath with any conversation.  Patient made aware of plan and is agreeable. _________________________________________   FINAL CLINICAL IMPRESSION(S) / ED DIAGNOSES  Final diagnoses:  HCAP (healthcare-associated pneumonia)     ED Discharge Orders    None       Note:  This document was prepared using Dragon voice recognition software and may include unintentional dictation errors.    Victorino Dike, FNP 10/22/18 [place], [place], MD 10/22/18 [phone]

## 2018-10-22 ENCOUNTER — Encounter: Payer: Self-pay | Admitting: Internal Medicine

## 2018-10-22 ENCOUNTER — Other Ambulatory Visit: Payer: Self-pay

## 2018-10-22 DIAGNOSIS — Z9841 Cataract extraction status, right eye: Secondary | ICD-10-CM | POA: Diagnosis not present

## 2018-10-22 DIAGNOSIS — J9601 Acute respiratory failure with hypoxia: Secondary | ICD-10-CM | POA: Diagnosis present

## 2018-10-22 DIAGNOSIS — J189 Pneumonia, unspecified organism: Secondary | ICD-10-CM | POA: Diagnosis present

## 2018-10-22 DIAGNOSIS — R69 Illness, unspecified: Secondary | ICD-10-CM | POA: Diagnosis not present

## 2018-10-22 DIAGNOSIS — A419 Sepsis, unspecified organism: Secondary | ICD-10-CM | POA: Diagnosis present

## 2018-10-22 DIAGNOSIS — Z961 Presence of intraocular lens: Secondary | ICD-10-CM | POA: Diagnosis present

## 2018-10-22 DIAGNOSIS — I2510 Atherosclerotic heart disease of native coronary artery without angina pectoris: Secondary | ICD-10-CM | POA: Diagnosis not present

## 2018-10-22 DIAGNOSIS — Z87440 Personal history of urinary (tract) infections: Secondary | ICD-10-CM | POA: Diagnosis not present

## 2018-10-22 DIAGNOSIS — Z79890 Hormone replacement therapy (postmenopausal): Secondary | ICD-10-CM | POA: Diagnosis not present

## 2018-10-22 DIAGNOSIS — J441 Chronic obstructive pulmonary disease with (acute) exacerbation: Secondary | ICD-10-CM | POA: Diagnosis not present

## 2018-10-22 DIAGNOSIS — Z8711 Personal history of peptic ulcer disease: Secondary | ICD-10-CM | POA: Diagnosis not present

## 2018-10-22 DIAGNOSIS — K219 Gastro-esophageal reflux disease without esophagitis: Secondary | ICD-10-CM | POA: Diagnosis present

## 2018-10-22 DIAGNOSIS — F17210 Nicotine dependence, cigarettes, uncomplicated: Secondary | ICD-10-CM | POA: Diagnosis present

## 2018-10-22 DIAGNOSIS — J449 Chronic obstructive pulmonary disease, unspecified: Secondary | ICD-10-CM | POA: Diagnosis not present

## 2018-10-22 DIAGNOSIS — F329 Major depressive disorder, single episode, unspecified: Secondary | ICD-10-CM | POA: Diagnosis present

## 2018-10-22 DIAGNOSIS — Z981 Arthrodesis status: Secondary | ICD-10-CM | POA: Diagnosis not present

## 2018-10-22 DIAGNOSIS — S32039A Unspecified fracture of third lumbar vertebra, initial encounter for closed fracture: Secondary | ICD-10-CM | POA: Diagnosis not present

## 2018-10-22 DIAGNOSIS — J440 Chronic obstructive pulmonary disease with acute lower respiratory infection: Secondary | ICD-10-CM | POA: Diagnosis present

## 2018-10-22 DIAGNOSIS — F419 Anxiety disorder, unspecified: Secondary | ICD-10-CM | POA: Diagnosis present

## 2018-10-22 DIAGNOSIS — G894 Chronic pain syndrome: Secondary | ICD-10-CM | POA: Diagnosis present

## 2018-10-22 DIAGNOSIS — M48061 Spinal stenosis, lumbar region without neurogenic claudication: Secondary | ICD-10-CM | POA: Diagnosis not present

## 2018-10-22 DIAGNOSIS — Z8701 Personal history of pneumonia (recurrent): Secondary | ICD-10-CM | POA: Diagnosis not present

## 2018-10-22 DIAGNOSIS — E785 Hyperlipidemia, unspecified: Secondary | ICD-10-CM | POA: Diagnosis not present

## 2018-10-22 DIAGNOSIS — R269 Unspecified abnormalities of gait and mobility: Secondary | ICD-10-CM | POA: Diagnosis not present

## 2018-10-22 DIAGNOSIS — Z9842 Cataract extraction status, left eye: Secondary | ICD-10-CM | POA: Diagnosis not present

## 2018-10-22 DIAGNOSIS — Z79899 Other long term (current) drug therapy: Secondary | ICD-10-CM | POA: Diagnosis not present

## 2018-10-22 DIAGNOSIS — Y95 Nosocomial condition: Secondary | ICD-10-CM | POA: Diagnosis present

## 2018-10-22 DIAGNOSIS — E039 Hypothyroidism, unspecified: Secondary | ICD-10-CM | POA: Diagnosis present

## 2018-10-22 DIAGNOSIS — Z9049 Acquired absence of other specified parts of digestive tract: Secondary | ICD-10-CM | POA: Diagnosis not present

## 2018-10-22 HISTORY — DX: Sepsis, unspecified organism: A41.9

## 2018-10-22 HISTORY — DX: Pneumonia, unspecified organism: J18.9

## 2018-10-22 LAB — BASIC METABOLIC PANEL
Anion gap: 7 (ref 5–15)
BUN: 17 mg/dL (ref 8–23)
CHLORIDE: 103 mmol/L (ref 98–111)
CO2: 30 mmol/L (ref 22–32)
Calcium: 8.6 mg/dL — ABNORMAL LOW (ref 8.9–10.3)
Creatinine, Ser: 0.63 mg/dL (ref 0.44–1.00)
GFR calc Af Amer: >60 mL/min (ref >60)
GFR calc non Af Amer: >60 mL/min (ref >60)
Glucose, Bld: 150 mg/dL — ABNORMAL HIGH (ref 70–99)
POTASSIUM: 3.9 mmol/L (ref 3.5–5.1)
Sodium: 140 mmol/L (ref 135–145)

## 2018-10-22 LAB — CBC
HCT: 37.2 % (ref 36.0–46.0)
Hemoglobin: 11.5 g/dL — ABNORMAL LOW (ref 12.0–15.0)
MCH: 29.4 pg (ref 26.0–34.0)
MCHC: 30.9 g/dL (ref 30.0–36.0)
MCV: 95.1 fL (ref 80.0–100.0)
NRBC: 0 % (ref 0.0–0.2)
Platelets: 400 10*3/uL (ref 150–400)
RBC: 3.91 MIL/uL (ref 3.87–5.11)
RDW: 13.3 % (ref 11.5–15.5)
WBC: 14.1 10*3/uL — ABNORMAL HIGH (ref 4.0–10.5)

## 2018-10-22 LAB — MRSA PCR SCREENING: MRSA by PCR: NEGATIVE

## 2018-10-22 MED ORDER — ONDANSETRON HCL 4 MG/2ML IJ SOLN: 4.0000 mg | Freq: Four times a day (QID) | INTRAMUSCULAR | Status: DC | PRN

## 2018-10-22 MED ORDER — DOCUSATE SODIUM 100 MG PO CAPS
100.0000 mg | ORAL_CAPSULE | Freq: Two times a day (BID) | ORAL | Status: DC
  Administered 2018-10-22 – 2018-10-24 (×5): 100 mg via ORAL
  Filled 2018-10-22 (×6): qty 1

## 2018-10-22 MED ORDER — SODIUM CHLORIDE 0.9 % IV SOLN
INTRAVENOUS | Status: DC | PRN
  Administered 2018-10-22 – 2018-10-23 (×2): via INTRAVENOUS

## 2018-10-22 MED ORDER — ONDANSETRON HCL 4 MG PO TABS: 4.0000 mg | ORAL_TABLET | Freq: Four times a day (QID) | ORAL | Status: DC | PRN

## 2018-10-22 MED ORDER — LEVOTHYROXINE SODIUM 88 MCG PO TABS
88.0000 ug | ORAL_TABLET | Freq: Every day | ORAL | Status: DC
  Administered 2018-10-22 – 2018-10-24 (×3): 88 ug via ORAL
  Filled 2018-10-22 (×4): qty 1

## 2018-10-22 MED ORDER — IPRATROPIUM-ALBUTEROL 0.5-2.5 (3) MG/3ML IN SOLN
RESPIRATORY_TRACT | Status: AC
  Administered 2018-10-22: 03:00:00
  Filled 2018-10-22: qty 3

## 2018-10-22 MED ORDER — AMLODIPINE BESYLATE 5 MG PO TABS
5.0000 mg | ORAL_TABLET | Freq: Every day | ORAL | Status: DC
  Administered 2018-10-22 – 2018-10-24 (×3): 5 mg via ORAL
  Filled 2018-10-22 (×3): qty 1

## 2018-10-22 MED ORDER — VANCOMYCIN HCL IN DEXTROSE 750-5 MG/150ML-% IV SOLN
750.0000 mg | Freq: Two times a day (BID) | INTRAVENOUS | Status: DC
  Administered 2018-10-22: 750 mg via INTRAVENOUS
  Filled 2018-10-22 (×3): qty 150

## 2018-10-22 MED ORDER — ESCITALOPRAM OXALATE 10 MG PO TABS
10.0000 mg | ORAL_TABLET | Freq: Every day | ORAL | Status: DC
  Administered 2018-10-22 – 2018-10-24 (×3): 10 mg via ORAL
  Filled 2018-10-22 (×4): qty 1

## 2018-10-22 MED ORDER — GUAIFENESIN-DM 100-10 MG/5ML PO SYRP: 5.0000 mL | ORAL_SOLUTION | ORAL | Status: DC | PRN

## 2018-10-22 MED ORDER — OXYCODONE HCL 5 MG PO TABS
10.0000 mg | ORAL_TABLET | Freq: Once | ORAL | Status: AC
  Administered 2018-10-22: 10 mg via ORAL
  Filled 2018-10-22: qty 2

## 2018-10-22 MED ORDER — TRAZODONE HCL 100 MG PO TABS
100.0000 mg | ORAL_TABLET | Freq: Every day | ORAL | Status: DC
  Administered 2018-10-22 – 2018-10-23 (×2): 100 mg via ORAL
  Filled 2018-10-22 (×3): qty 1

## 2018-10-22 MED ORDER — ALPRAZOLAM 0.5 MG PO TABS
1.0000 mg | ORAL_TABLET | Freq: Two times a day (BID) | ORAL | Status: DC | PRN
  Administered 2018-10-22 – 2018-10-24 (×6): 1 mg via ORAL
  Filled 2018-10-22 (×6): qty 2

## 2018-10-22 MED ORDER — SODIUM CHLORIDE 0.9 % IV SOLN
1.0000 g | Freq: Two times a day (BID) | INTRAVENOUS | Status: DC
  Administered 2018-10-22 – 2018-10-24 (×4): 1 g via INTRAVENOUS
  Filled 2018-10-22 (×6): qty 1

## 2018-10-22 MED ORDER — OXYCODONE HCL 5 MG PO TABS
5.0000 mg | ORAL_TABLET | Freq: Four times a day (QID) | ORAL | Status: DC | PRN
  Administered 2018-10-22 (×2): 5 mg via ORAL
  Filled 2018-10-22 (×2): qty 1

## 2018-10-22 MED ORDER — ACETAMINOPHEN 650 MG RE SUPP: 650.0000 mg | Freq: Four times a day (QID) | RECTAL | Status: DC | PRN

## 2018-10-22 MED ORDER — BUPROPION HCL ER (SR) 150 MG PO TB12
150.0000 mg | ORAL_TABLET | Freq: Every day | ORAL | Status: DC
  Administered 2018-10-22 – 2018-10-24 (×3): 150 mg via ORAL
  Filled 2018-10-22 (×4): qty 1

## 2018-10-22 MED ORDER — OXYCODONE HCL 5 MG PO TABS
10.0000 mg | ORAL_TABLET | Freq: Four times a day (QID) | ORAL | Status: DC | PRN
  Administered 2018-10-22 – 2018-10-24 (×7): 10 mg via ORAL
  Filled 2018-10-22 (×8): qty 2

## 2018-10-22 MED ORDER — ENOXAPARIN SODIUM 40 MG/0.4ML ~~LOC~~ SOLN: 40.0000 mg | SUBCUTANEOUS | Status: DC

## 2018-10-22 MED ORDER — MOMETASONE FURO-FORMOTEROL FUM 100-5 MCG/ACT IN AERO
2.0000 | INHALATION_SPRAY | Freq: Two times a day (BID) | RESPIRATORY_TRACT | Status: DC
  Administered 2018-10-22 – 2018-10-24 (×5): 2 via RESPIRATORY_TRACT
  Filled 2018-10-22 (×2): qty 8.8

## 2018-10-22 MED ORDER — ACETAMINOPHEN 325 MG PO TABS: 650.0000 mg | ORAL_TABLET | Freq: Four times a day (QID) | ORAL | Status: DC | PRN

## 2018-10-22 MED ORDER — IPRATROPIUM-ALBUTEROL 0.5-2.5 (3) MG/3ML IN SOLN
3.0000 mL | RESPIRATORY_TRACT | Status: DC | PRN
  Administered 2018-10-22 – 2018-10-24 (×4): 3 mL via RESPIRATORY_TRACT
  Filled 2018-10-22 (×3): qty 3

## 2018-10-22 MED ORDER — BENZONATATE 100 MG PO CAPS
200.0000 mg | ORAL_CAPSULE | Freq: Three times a day (TID) | ORAL | Status: DC | PRN
  Administered 2018-10-22: 200 mg via ORAL
  Filled 2018-10-22: qty 2

## 2018-10-22 NOTE — Progress Notes (Signed)
Pharmacy Antibiotic Note  [name] is a 72 y.o. female admitted on 10/21/2018 with pneumonia.  Pharmacy has been consulted for vanc/cefepime dosing. Patient received vanc 1g and cefepime 1g IV x 1 in ED  Plan: Will continue w/ vanc 750 mg IV q12h w/ 8 hour stack  Will draw a trough 12/24 @ 2000 prior to 4th dose. Will continue cefepime 1g IV q12h  Ke 0.0506 T1/2 ~ 12 hrs Goal trough 15 - 20 mcg/mL  Height: 5\' 4"  (162.6 cm) Weight: 131 lb 13.4 oz (59.8 kg) IBW/kg (Calculated) : 54.7  Temp (24hrs), Avg:97.7 F (36.5 C), Min:97.7 F (36.5 C), Max:97.7 F (36.5 C)  Recent Labs  Lab 10/21/18 2045 10/21/18 2212  WBC 16.6*  --   CREATININE 0.66  --   LATICACIDVEN  --  1.9    Estimated Creatinine Clearance: 55.7 mL/min (by C-G formula based on SCr of 0.66 mg/dL).    No Known Allergies  Thank you for allowing pharmacy to be a part of this patient's care.  Tobie Lords, PharmD, BCPS Clinical Pharmacist 10/22/2018

## 2018-10-22 NOTE — H&P (Signed)
[place] [facility] [place] NAME: [name]    MR#:  [id number]  DATE OF BIRTH:  02/13/1946  DATE OF ADMISSION:  10/21/2018  PRIMARY CARE PHYSICIAN: Albina Billet, MD   REQUESTING/REFERRING PHYSICIAN: Vanessa Bevier, FNP  CHIEF COMPLAINT:   Chief Complaint  Patient presents with  . Shortness of Breath  . Cough    HISTORY OF PRESENT ILLNESS:  [name]  is a 72 y.o. female who presents with chief complaint as above.  Patient presents with couple days of worsening shortness of breath, cough with some sputum production.  Here in the ED she is found to have pneumonia, and meet sepsis criteria.  Hospitalist were called for admission  PAST MEDICAL HISTORY:   Past Medical History:  Diagnosis Date  . Anxiety   . CAD (coronary artery disease)   . Carpal tunnel syndrome   . Cataract   . Complication of anesthesia    has woken up at times  . COPD (chronic obstructive pulmonary disease) ([place])   . CRP elevated 09/14/2015  . Depression   . Difficult intubation   . Dyslipidemia   . Dyspnea    DOE  . Dysrhythmia    hx palpatations  . Elevated sedimentation rate 09/14/2015  . Esophageal spasm   . Gastrointestinal parasites   . GERD (gastroesophageal reflux disease)   . Hiatal hernia   . History of peptic ulcer disease   . Hyperthyroidism   . Hypothyroidism   . Low magnesium levels 09/14/2015  . Pelvic fracture ([place]) 2008   fall from riding a horse  . Reflux   . Rotator cuff injury   . Stenosis, spinal, lumbar      PAST SURGICAL HISTORY:   Past Surgical History:  Procedure Laterality Date  . APPENDECTOMY    . BACK SURGERY     CERVICAL FUSION  . CARPAL TUNNEL RELEASE    . CATARACT EXTRACTION W/PHACO Right 08/07/2017   Procedure: CATARACT EXTRACTION PHACO AND INTRAOCULAR LENS PLACEMENT (IOC);  Surgeon: Birder Robson, MD;  Location: [facility];  Service: Ophthalmology;  Laterality: Right;  [place] 00:52.0 AP% 16.8 CDE 8.74 Fluid  Pack Lot # [id number]  . CATARACT EXTRACTION W/PHACO Left 09/04/2017   Procedure: CATARACT EXTRACTION PHACO AND INTRAOCULAR LENS PLACEMENT (IOC);  Surgeon: Birder Robson, MD;  Location: [facility] OR[facility];  Service: Ophthalmology;  Laterality: Left;  [place] 00:34 AP% 17.0 CDE 5.80 Fluid pack lot # [id number]  . CESAREAN SECTION    . CHOLECYSTECTOMY    . FRACTURE SURGERY    . HIP SURGERY    . INTRAMEDULLARY (IM) NAIL INTERTROCHANTERIC Right 10/01/2016   Procedure: INTRAMEDULLARY (IM) NAIL INTERTROCHANTRIC;  Surgeon: Corky Mull, MD;  Location: [facility];  Service: Orthopedics;  Laterality: Right;  . KYPHOPLASTY N/A 08/20/2018   Procedure: [id number];  Surgeon: Hessie Knows, MD;  Location: [facility];  Service: Orthopedics;  Laterality: N/A;  . NECK SURGERY    . NOSE SURGERY    . ROTATOR CUFF REPAIR     x2  . SHOULDER ARTHROSCOPY  12/07/2011   Procedure: ARTHROSCOPY SHOULDER;  Surgeon: Ninetta Lights, MD;  Location: [place];  Service: Orthopedics;  Laterality: Right;  Debridement Partial Cuff Tear, Release Coracoacromial Ligament  . SHOULDER SURGERY  12/07/2011   right     SOCIAL HISTORY:   Social History   Tobacco Use  . Smoking status: Current Every Day Smoker    Packs/day: 0.50    Types: Cigarettes  .  Smokeless tobacco: Never Used  Substance Use Topics  . Alcohol use: No     FAMILY HISTORY:   Family History  Problem Relation Age of Onset  . Aneurysm Unknown      DRUG ALLERGIES:  No Known Allergies  MEDICATIONS AT HOME:   Prior to Admission medications   Medication Sig Start Date End Date Taking? Authorizing Provider  ALPRAZolam Duanne Moron) 1 MG tablet Take 1 tablet (1 mg total) by mouth 4 (four) times daily as needed. Patient taking differently: Take 1 mg by mouth 2 (two) times daily as needed.  10/04/16   Epifanio Lesches, MD  amLODipine (NORVASC) 5 MG tablet Take 1 tablet (5 mg total) by mouth daily. 08/22/18   Gladstone Lighter, MD  buPROPion  (WELLBUTRIN SR) 150 MG 12 hr tablet Take 150 mg by mouth daily.     [provider]  Cholecalciferol (VITAMIN D3) 125 MCG (5000 UT) TABS Take 1 tablet (5,000 Units total) by mouth daily. 09/30/18   Vevelyn Francois, NP  docusate sodium (COLACE) 100 MG capsule Take 1 capsule (100 mg total) by mouth 2 (two) times daily. 08/21/18   Gladstone Lighter, MD  escitalopram (LEXAPRO) 10 MG tablet Take 10 mg by mouth daily.  10/15/13   [provider]  Fluticasone-Salmeterol (ADVAIR) 100-50 MCG/DOSE AEPB Inhale 1 puff into the lungs every 12 (twelve) hours.    [provider]  HYDROcodone-acetaminophen (NORCO/VICODIN) 5-325 MG tablet Take 1-2 tablets by mouth every 4 (four) hours as needed for moderate pain. 08/21/18   Gladstone Lighter, MD  Ipratropium-Albuterol (COMBIVENT RESPIMAT) 20-100 MCG/ACT AERS respimat Inhale 1 puff into the lungs every 6 (six) hours as needed for wheezing or shortness of breath.    [provider]  levothyroxine (SYNTHROID, LEVOTHROID) 88 MCG tablet Take 88 mcg by mouth daily before breakfast.     [provider]  magnesium oxide (MAG-OX) 400 MG tablet Take 1 tablet (400 mg total) by mouth 2 (two) times daily. 09/30/18 12/29/18  Vevelyn Francois, NP  oxyCODONE (OXY IR/ROXICODONE) 5 MG immediate release tablet Take 1 tablet (5 mg total) by mouth every 6 (six) hours as needed for severe pain. 11/29/18 12/29/18  Vevelyn Francois, NP  oxyCODONE (OXY IR/ROXICODONE) 5 MG immediate release tablet Take 1 tablet (5 mg total) by mouth every 6 (six) hours as needed for severe pain. 10/30/18 11/29/18  Vevelyn Francois, NP  oxyCODONE (OXY IR/ROXICODONE) 5 MG immediate release tablet Take 1 tablet (5 mg total) by mouth every 6 (six) hours as needed for severe pain. 09/30/18 10/30/18  Vevelyn Francois, NP  tizanidine (ZANAFLEX) 2 MG capsule Take 1 capsule (2 mg total) by mouth 3 (three) times daily as needed for muscle spasms. 09/30/18 12/29/18  Vevelyn Francois, NP   traZODone (DESYREL) 100 MG tablet Take 1 tablet (100 mg total) by mouth at bedtime. 08/21/18   Gladstone Lighter, MD    REVIEW OF SYSTEMS:  Review of Systems  Constitutional: Negative for chills, fever, malaise/fatigue and weight loss.  HENT: Negative for ear pain, hearing loss and tinnitus.   Eyes: Negative for blurred vision, double vision, pain and redness.  Respiratory: Positive for cough, sputum production and shortness of breath. Negative for hemoptysis.   Cardiovascular: Negative for chest pain, palpitations, orthopnea and leg swelling.  Gastrointestinal: Negative for abdominal pain, constipation, diarrhea, nausea and vomiting.  Genitourinary: Negative for dysuria, frequency and hematuria.  Musculoskeletal: Negative for back pain, joint pain and neck pain.  Skin:  No acne, rash, or lesions  Neurological: Negative for dizziness, tremors, focal weakness and weakness.  Endo/Heme/Allergies: Negative for polydipsia. Does not bruise/bleed easily.  Psychiatric/Behavioral: Negative for depression. The patient is not nervous/anxious and does not have insomnia.      VITAL SIGNS:   Vitals:   10/21/18 2033 10/21/18 2312 10/22/18 0031 10/22/18 0107  BP:  (!) 153/90 (!) 164/106 (!) 156/85  Pulse:  (!) 106 (!) 105 (!) 109  Resp:  20 17 18   Temp:      TempSrc:      SpO2:  97% 96% 95%  Weight: 59.8 kg     Height: 5\' 4"  (1.626 m)      Wt Readings from Last 3 Encounters:  10/21/18 59.8 kg  09/30/18 59.9 kg  08/21/18 60.1 kg    PHYSICAL EXAMINATION:  Physical Exam  Vitals reviewed. Constitutional: She is oriented to person, place, and time. She appears well-developed and well-nourished. No distress.  HENT:  Head: Normocephalic and atraumatic.  Mouth/Throat: Oropharynx is clear and moist.  Eyes: Pupils are equal, round, and reactive to light. Conjunctivae and EOM are normal. No scleral icterus.  Neck: Normal range of motion. Neck supple. No JVD present. No thyromegaly present.   Cardiovascular: Normal rate, regular rhythm and intact distal pulses. Exam reveals no gallop and no friction rub.  No murmur heard. Respiratory: Effort normal. No respiratory distress. She has wheezes. She has no rales.  Fine rhonchi  GI: Soft. Bowel sounds are normal. She exhibits no distension. There is no tenderness.  Musculoskeletal: Normal range of motion. She exhibits no edema.  No arthritis, no gout  Lymphadenopathy:    She has no cervical adenopathy.  Neurological: She is alert and oriented to person, place, and time. No cranial nerve deficit.  No dysarthria, no aphasia  Skin: Skin is warm and dry. No rash noted. No erythema.  Psychiatric: She has a normal mood and affect. Her behavior is normal. Judgment and thought content normal.    LABORATORY PANEL:   CBC Recent Labs  Lab 10/21/18 2045  WBC 16.6*  HGB 13.1  HCT 41.5  PLT 523*   ------------------------------------------------------------------------------------------------------------------  Chemistries  Recent Labs  Lab 10/21/18 2045  NA 143  K 3.3*  CL 104  CO2 29  GLUCOSE 115*  BUN 21  CREATININE 0.66  CALCIUM 9.0  AST 25  ALT 20  ALKPHOS 101  BILITOT 0.4   ------------------------------------------------------------------------------------------------------------------  Cardiac Enzymes Recent Labs  Lab 10/21/18 2311  TROPONINI <0.03   ------------------------------------------------------------------------------------------------------------------  RADIOLOGY:  Dg Chest 2 View  Result Date: 10/21/2018 CLINICAL DATA:  Productive cough EXAM: CHEST - 2 VIEW COMPARISON:  08/17/2018 FINDINGS: There is hyperinflation of the lungs compatible with COPD. Heart and mediastinal contours are within normal limits. No focal opacities or effusions. No acute bony abnormality. IMPRESSION: COPD.  No active disease. Electronically Signed   By: Rolm Baptise M.D.   On: 10/21/2018 21:16   Ct Angio Chest Pe W  And/or Wo Contrast  Result Date: 10/21/2018 CLINICAL DATA:  Shortness of breath. Recent treatment for pneumonia. EXAM: CT ANGIOGRAPHY CHEST WITH CONTRAST TECHNIQUE: Multidetector CT imaging of the chest was performed using the standard protocol during bolus administration of intravenous contrast. Multiplanar CT image reconstructions and MIPs were obtained to evaluate the vascular anatomy. CONTRAST:  42mL OMNIPAQUE IOHEXOL 350 MG/ML SOLN COMPARISON:  Plain film earlier today. FINDINGS: Cardiovascular: Mild cardiomegaly. Calcifications throughout the coronary arteries. Scattered aortic calcifications. No aneurysm. No filling defects in the pulmonary  arteries to suggest pulmonary emboli. Mediastinum/Nodes: Mildly enlarged right paratracheal node measuring 11 mm. Borderline right hilar lymph nodes. Right hilar lymph node has a short axis diameter of 10 mm. Small hiatal hernia. Lungs/Pleura: Moderate emphysema. Airspace opacity in the posterior right upper lobe could reflect early infiltrate or scarring. No effusions. Upper Abdomen: Prior cholecystectomy. Intrahepatic and extrahepatic biliary ductal dilatation. The common bile duct measures up to 12 mm. Fullness of the adrenal glands bilaterally. Musculoskeletal: Chest wall soft tissues are unremarkable. Mild compression through the the superior endplate of X10 and inferior endplate of T6. Review of the MIP images confirms the above findings. IMPRESSION: No evidence of pulmonary embolus. Mildly prominent mediastinal and right hilar lymph nodes. These may be reactive. These could be followed with repeat chest CT with IV contrast in 6 months to ensure stability or resolution. Airspace opacity in the posterior inferior right upper lobe could reflect scarring or early pneumonia. Intrahepatic and extrahepatic biliary ductal dilatation. This may be related to patient's age and post cholecystectomy state. Recommend correlation with LFTs. Aortic Atherosclerosis (ICD10-I70.0)  and Emphysema (ICD10-J43.9). Electronically Signed   By: Rolm Baptise M.D.   On: 10/21/2018 23:22    EKG:   Orders placed or performed during the hospital encounter of 10/21/18  . EKG 12-Lead  . EKG 12-Lead  . ED EKG  . ED EKG    IMPRESSION AND PLAN:  Principal Problem:   Sepsis ([place]) -due to pneumonia, lactic acid within normal limits, blood pressure stable, IV antibiotics given, culture sent Active Problems:   HCAP (healthcare-associated pneumonia) -IV antibiotics as above, PRN duo nebs, antitussive, supportive treatment   Chronic obstructive pulmonary disease (COPD) ([place]) -continue home dose inhalers, other treatment as above   HLD (hyperlipidemia) -continue home dose antilipid   Hypothyroidism -continue home dose thyroid replacement   Anxiety and depression -tinea home medications  Chart review performed and case discussed with ED provider. Labs, imaging and/or ECG reviewed by provider and discussed with patient/family. Management plans discussed with the patient and/or family.  DVT PROPHYLAXIS: SubQ lovenox   GI PROPHYLAXIS:  None  ADMISSION STATUS: Inpatient     CODE STATUS: Full Code Status History    Date Active Date Inactive Code Status Order ID Comments User Context   08/18/2018 0248 08/21/2018 2036 Full Code [id number]  Amelia Jo, MD Inpatient   10/01/2016 1138 10/04/2016 1744 Full Code [id number]  Fritzi Mandes, MD Inpatient      TOTAL TIME TAKING CARE OF THIS PATIENT: 45 minutes.   Pinkie Manger [place] 10/22/2018, 1:14 AM  [employer]  [phone]  CC: Primary care physician; Albina Billet, MD  Note:  This document was prepared using Dragon voice recognition software and may include unintentional dictation errors.

## 2018-10-22 NOTE — Progress Notes (Signed)
[place] at [place] NAME: [name]    MR#:  [id number]  DATE OF BIRTH:  05/13/1946  SUBJECTIVE:  CHIEF COMPLAINT:   Chief Complaint  Patient presents with  . Shortness of Breath  . Cough   Continues to have shortness of breath and weakness.  Has chronic pain which is unchanged.  REVIEW OF SYSTEMS:    Review of Systems  Constitutional: Positive for malaise/fatigue. Negative for chills and fever.  HENT: Negative for sore throat.   Eyes: Negative for blurred vision, double vision and pain.  Respiratory: Positive for cough, shortness of breath and wheezing. Negative for hemoptysis.   Cardiovascular: Negative for chest pain, palpitations, orthopnea and leg swelling.  Gastrointestinal: Negative for abdominal pain, constipation, diarrhea, heartburn, nausea and vomiting.  Genitourinary: Negative for dysuria and hematuria.  Musculoskeletal: Negative for back pain and joint pain.  Skin: Negative for rash.  Neurological: Negative for sensory change, speech change, focal weakness and headaches.  Endo/Heme/Allergies: Does not bruise/bleed easily.  Psychiatric/Behavioral: Negative for depression. The patient is not nervous/anxious.     DRUG ALLERGIES:  No Known Allergies  VITALS:  Blood pressure (!) 155/88, pulse (!) 110, temperature 98.2 F (36.8 C), temperature source Oral, resp. rate 18, height 5\' 4"  (1.626 m), weight 59.8 kg, SpO2 96 %.  PHYSICAL EXAMINATION:   Physical Exam  GENERAL:  72 y.o.-year-old patient lying in the bed with no acute distress.  EYES: Pupils equal, round, reactive to light and accommodation. No scleral icterus. Extraocular muscles intact.  HEENT: Head atraumatic, normocephalic. Oropharynx and nasopharynx clear.  NECK:  Supple, no jugular venous distention. No thyroid enlargement, no tenderness.  LUNGS: Bilateral wheezing CARDIOVASCULAR: S1, S2 normal. No murmurs, rubs, or gallops.  ABDOMEN: Soft, nontender,  nondistended. Bowel sounds present. No organomegaly or mass.  EXTREMITIES: No cyanosis, clubbing or edema b/l.    NEUROLOGIC: Cranial nerves II through XII are intact. No focal Motor or sensory deficits b/l.   PSYCHIATRIC: The patient is alert and oriented x 3.  SKIN: No obvious rash, lesion, or ulcer.   LABORATORY PANEL:   CBC Recent Labs  Lab 10/22/18 0533  WBC 14.1*  HGB 11.5*  HCT 37.2  PLT 400   ------------------------------------------------------------------------------------------------------------------ Chemistries  Recent Labs  Lab 10/21/18 2045 10/22/18 0533  NA 143 140  K 3.3* 3.9  CL 104 103  CO2 29 30  GLUCOSE 115* 150*  BUN 21 17  CREATININE 0.66 0.63  CALCIUM 9.0 8.6*  AST 25  --   ALT 20  --   ALKPHOS 101  --   BILITOT 0.4  --    ------------------------------------------------------------------------------------------------------------------  Cardiac Enzymes Recent Labs  Lab 10/21/18 2311  TROPONINI <0.03   ------------------------------------------------------------------------------------------------------------------  RADIOLOGY:  Dg Chest 2 View  Result Date: 10/21/2018 CLINICAL DATA:  Productive cough EXAM: CHEST - 2 VIEW COMPARISON:  08/17/2018 FINDINGS: There is hyperinflation of the lungs compatible with COPD. Heart and mediastinal contours are within normal limits. No focal opacities or effusions. No acute bony abnormality. IMPRESSION: COPD.  No active disease. Electronically Signed   By: Rolm Baptise M.D.   On: 10/21/2018 21:16   Ct Angio Chest Pe W And/or Wo Contrast  Result Date: 10/21/2018 CLINICAL DATA:  Shortness of breath. Recent treatment for pneumonia. EXAM: CT ANGIOGRAPHY CHEST WITH CONTRAST TECHNIQUE: Multidetector CT imaging of the chest was performed using the standard protocol during bolus administration of intravenous contrast. Multiplanar CT image reconstructions and MIPs were obtained to  evaluate the vascular anatomy.  CONTRAST:  61mL OMNIPAQUE IOHEXOL 350 MG/ML SOLN COMPARISON:  Plain film earlier today. FINDINGS: Cardiovascular: Mild cardiomegaly. Calcifications throughout the coronary arteries. Scattered aortic calcifications. No aneurysm. No filling defects in the pulmonary arteries to suggest pulmonary emboli. Mediastinum/Nodes: Mildly enlarged right paratracheal node measuring 11 mm. Borderline right hilar lymph nodes. Right hilar lymph node has a short axis diameter of 10 mm. Small hiatal hernia. Lungs/Pleura: Moderate emphysema. Airspace opacity in the posterior right upper lobe could reflect early infiltrate or scarring. No effusions. Upper Abdomen: Prior cholecystectomy. Intrahepatic and extrahepatic biliary ductal dilatation. The common bile duct measures up to 12 mm. Fullness of the adrenal glands bilaterally. Musculoskeletal: Chest wall soft tissues are unremarkable. Mild compression through the the superior endplate of T61 and inferior endplate of T6. Review of the MIP images confirms the above findings. IMPRESSION: No evidence of pulmonary embolus. Mildly prominent mediastinal and right hilar lymph nodes. These may be reactive. These could be followed with repeat chest CT with IV contrast in 6 months to ensure stability or resolution. Airspace opacity in the posterior inferior right upper lobe could reflect scarring or early pneumonia. Intrahepatic and extrahepatic biliary ductal dilatation. This may be related to patient's age and post cholecystectomy state. Recommend correlation with LFTs. Aortic Atherosclerosis (ICD10-I70.0) and Emphysema (ICD10-J43.9). Electronically Signed   By: Rolm Baptise M.D.   On: 10/21/2018 23:22     ASSESSMENT AND PLAN:   * Pneumonia with acute hypoxic respiratory failure and COPD exacerbation *Sepsis present on admission  -IV steroids, Antibiotics - Scheduled Nebulizers - Inhalers -Wean O2 as tolerated - Consult pulmonary if no improvement  *Chronic pain syndrome.   Started on her home dose of oxycodone.  DVT prophylaxis with Lovenox   All the records are reviewed and case discussed with Care Management/Social Worker Management plans discussed with the patient, family and they are in agreement.  CODE STATUS: FULL CODE  DVT Prophylaxis: SCDs  TOTAL TIME TAKING CARE OF THIS PATIENT: 35 minutes.   POSSIBLE D/C IN 1-2 DAYS, DEPENDING ON CLINICAL CONDITION.  Neita Carp M.D on 10/22/2018 at 4:25 PM  Between 7am to 6pm - Pager - [phone]  After 6pm go to [email or web address] - password [email or web address] [facility]  Office  [phone]  CC: Primary care physician; Albina Billet, MD  Note: This dictation was prepared with Dragon dictation along with smaller phrase technology. Any transcriptional errors that result from this process are unintentional.

## 2018-10-22 NOTE — Care Management Note (Signed)
Case Management Note  Patient Details  Name: [name] MRN: [id number] Date of Birth: 03/10/1946  Subjective/Objective:                 Patient from home via ems for shortness of breath.  patient has had a recent discharge from [facility] 10/2.  It is documented that patient qualified for oxygen and orders for home health. but unable to find documentation of actual referral and specific services.  It appears patient did qualify for oxygen which was provided by [facility] and would expect agency would have received referral for the services.  Patient lives alone. No issues with transportation or obtaining medications. During progression, no discharge needs were identified but patient may need resumption of care order if open to home health.   Action/Plan:  Reached out to [facility] to see if patient is/has been open to agency   Expected Discharge Date:                  Expected Discharge Plan:     In-House Referral:     Discharge planning Services     Post Acute Care Choice:    Choice offered to:     DME Arranged:    DME Agency:     HH Arranged:    [place] Agency:     Status of Service:     If discussed at [employer], dates discussed:    Additional Comments:  Katrina Stack, RN 10/22/2018, 5:36 PM

## 2018-10-22 NOTE — Progress Notes (Signed)
[facility] care planning  Purpose of Encounter Pneumonia and CODE STATUS discussion  Parties in Attendance Patient  Patients Decisional capacity Patient is alert and oriented.  Able to make medical decisions  She does not have a documented healthcare power of attorney or advanced directives.  She tells me she has a brother but does not want him involved in healthcare decisions.  The only other person left is her lawyer.  Encouraged her to finish documentation.  Discussed with patient regarding her pneumonia, COPD exacerbation and treatment plan.  All questions answered.  Discussed regarding CODE STATUS and patient would like to be intubated if needed and have resuscitation done.  Does not want to be on long-term life support.  Time spent - 17 minutes

## 2018-10-23 NOTE — Progress Notes (Signed)
[facility] [place] NAME: [name]    MR#:  [id number]  DATE OF BIRTH:  [birth year]  SUBJECTIVE:  CHIEF COMPLAINT:   Chief Complaint  Patient presents with  . Shortness of Breath  . Cough   With some improvement in her breathing denies any chest pain  REVIEW OF SYSTEMS:    Review of Systems  Constitutional: Positive for malaise/fatigue. Negative for chills and fever.  HENT: Negative for sore throat.   Eyes: Negative for blurred vision, double vision and pain.  Respiratory: Positive for cough, shortness of breath and wheezing. Negative for hemoptysis.   Cardiovascular: Negative for chest pain, palpitations, orthopnea and leg swelling.  Gastrointestinal: Negative for abdominal pain, constipation, diarrhea, heartburn, nausea and vomiting.  Genitourinary: Negative for dysuria and hematuria.  Musculoskeletal: Negative for back pain and joint pain.  Skin: Negative for rash.  Neurological: Negative for sensory change, speech change, focal weakness and headaches.  Endo/Heme/Allergies: Does not bruise/bleed easily.  Psychiatric/Behavioral: Negative for depression. The patient is not nervous/anxious.     DRUG ALLERGIES:  No Known Allergies  VITALS:  Blood pressure 128/62, pulse 95, temperature 97.7 F (36.5 C), temperature source Oral, resp. rate 16, height 5\' 4"  (1.626 m), weight 59.8 kg, SpO2 99 %.  PHYSICAL EXAMINATION:   Physical Exam  GENERAL:  72 y.o.-year-old patient lying in the bed with no acute distress.  EYES: Pupils equal, round, reactive to light and accommodation. No scleral icterus. Extraocular muscles intact.  HEENT: Head atraumatic, normocephalic. Oropharynx and nasopharynx clear.  NECK:  Supple, no jugular venous distention. No thyroid enlargement, no tenderness.  LUNGS: Bilateral wheezing CARDIOVASCULAR: S1, S2 normal. No murmurs, rubs, or gallops.  ABDOMEN: Soft, nontender, nondistended. Bowel sounds present. No  organomegaly or mass.  EXTREMITIES: No cyanosis, clubbing or edema b/l.    NEUROLOGIC: Cranial nerves II through XII are intact. No focal Motor or sensory deficits b/l.   PSYCHIATRIC: The patient is alert and oriented x 3.  SKIN: No obvious rash, lesion, or ulcer.   LABORATORY PANEL:   CBC Recent Labs  Lab 10/22/18 0533  WBC 14.1*  HGB 11.5*  HCT 37.2  PLT 400   ------------------------------------------------------------------------------------------------------------------ Chemistries  Recent Labs  Lab 10/21/18 2045 10/22/18 0533  NA 143 140  K 3.3* 3.9  CL 104 103  CO2 29 30  GLUCOSE 115* 150*  BUN 21 17  CREATININE 0.66 0.63  CALCIUM 9.0 8.6*  AST 25  --   ALT 20  --   ALKPHOS 101  --   BILITOT 0.4  --    ------------------------------------------------------------------------------------------------------------------  Cardiac Enzymes Recent Labs  Lab 10/21/18 2311  TROPONINI <0.03   ------------------------------------------------------------------------------------------------------------------  RADIOLOGY:  Dg Chest 2 View  Result Date: 10/21/2018 CLINICAL DATA:  Productive cough EXAM: CHEST - 2 VIEW COMPARISON:  08/17/2018 FINDINGS: There is hyperinflation of the lungs compatible with COPD. Heart and mediastinal contours are within normal limits. No focal opacities or effusions. No acute bony abnormality. IMPRESSION: COPD.  No active disease. Electronically Signed   By: Rolm Baptise M.D.   On: 10/21/2018 21:16   Ct Angio Chest Pe W And/or Wo Contrast  Result Date: 10/21/2018 CLINICAL DATA:  Shortness of breath. Recent treatment for pneumonia. EXAM: CT ANGIOGRAPHY CHEST WITH CONTRAST TECHNIQUE: Multidetector CT imaging of the chest was performed using the standard protocol during bolus administration of intravenous contrast. Multiplanar CT image reconstructions and MIPs were obtained to evaluate the vascular anatomy. CONTRAST:  56mL  OMNIPAQUE IOHEXOL 350  MG/ML SOLN COMPARISON:  Plain film earlier today. FINDINGS: Cardiovascular: Mild cardiomegaly. Calcifications throughout the coronary arteries. Scattered aortic calcifications. No aneurysm. No filling defects in the pulmonary arteries to suggest pulmonary emboli. Mediastinum/Nodes: Mildly enlarged right paratracheal node measuring 11 mm. Borderline right hilar lymph nodes. Right hilar lymph node has a short axis diameter of 10 mm. Small hiatal hernia. Lungs/Pleura: Moderate emphysema. Airspace opacity in the posterior right upper lobe could reflect early infiltrate or scarring. No effusions. Upper Abdomen: Prior cholecystectomy. Intrahepatic and extrahepatic biliary ductal dilatation. The common bile duct measures up to 12 mm. Fullness of the adrenal glands bilaterally. Musculoskeletal: Chest wall soft tissues are unremarkable. Mild compression through the the superior endplate of B15 and inferior endplate of T6. Review of the MIP images confirms the above findings. IMPRESSION: No evidence of pulmonary embolus. Mildly prominent mediastinal and right hilar lymph nodes. These may be reactive. These could be followed with repeat chest CT with IV contrast in 6 months to ensure stability or resolution. Airspace opacity in the posterior inferior right upper lobe could reflect scarring or early pneumonia. Intrahepatic and extrahepatic biliary ductal dilatation. This may be related to patient's age and post cholecystectomy state. Recommend correlation with LFTs. Aortic Atherosclerosis (ICD10-I70.0) and Emphysema (ICD10-J43.9). Electronically Signed   By: Rolm Baptise M.D.   On: 10/21/2018 23:22     ASSESSMENT AND PLAN:   * Pneumonia with acute hypoxic respiratory failure and COPD exacerbation  *Sepsis present on admission -IV steroids, Antibiotics - Scheduled Nebulizers - Inhalers -Wean O2 as tolerated -Add Mucomyst to current regimen  *Chronic pain syndrome.  Continue oxycodone.   DVT prophylaxis with  Lovenox   All the records are reviewed and case discussed with Care Management/Social Worker Management plans discussed with the patient, family and they are in agreement.  CODE STATUS: FULL CODE  DVT Prophylaxis: SCDs  TOTAL TIME TAKING CARE OF THIS PATIENT: 35 minutes.   POSSIBLE D/C IN 1-2 DAYS, DEPENDING ON CLINICAL CONDITION.  Dustin Flock M.D on 10/23/2018 at 3:13 PM  Between 7am to 6pm - Pager - [phone]  After 6pm go to [email or web address] - password [email or web address] [facility]  Office  [phone]  CC: Primary care physician; Albina Billet, MD  Note: This dictation was prepared with Dragon dictation along with smaller phrase technology. Any transcriptional errors that result from this process are unintentional.

## 2018-10-24 LAB — BASIC METABOLIC PANEL
Anion gap: 7 (ref 5–15)
BUN: 14 mg/dL (ref 8–23)
CO2: 27 mmol/L (ref 22–32)
Calcium: 8.5 mg/dL — ABNORMAL LOW (ref 8.9–10.3)
Chloride: 110 mmol/L (ref 98–111)
Creatinine, Ser: 0.6 mg/dL (ref 0.44–1.00)
GFR calc Af Amer: >60 mL/min (ref >60)
GFR calc non Af Amer: >60 mL/min (ref >60)
Glucose, Bld: 91 mg/dL (ref 70–99)
Potassium: 3.8 mmol/L (ref 3.5–5.1)
Sodium: 144 mmol/L (ref 135–145)

## 2018-10-24 LAB — CBC
HCT: 34.8 % — ABNORMAL LOW (ref 36.0–46.0)
Hemoglobin: 10.9 g/dL — ABNORMAL LOW (ref 12.0–15.0)
MCH: 30 pg (ref 26.0–34.0)
MCHC: 31.3 g/dL (ref 30.0–36.0)
MCV: 95.9 fL (ref 80.0–100.0)
Platelets: 340 10*3/uL (ref 150–400)
RBC: 3.63 MIL/uL — ABNORMAL LOW (ref 3.87–5.11)
RDW: 13.3 % (ref 11.5–15.5)
WBC: 12.7 10*3/uL — ABNORMAL HIGH (ref 4.0–10.5)
nRBC: 0 % (ref 0.0–0.2)

## 2018-10-24 MED ORDER — PREDNISONE 10 MG (21) PO TBPK: ORAL_TABLET | ORAL | 0 refills | Status: DC

## 2018-10-24 MED ORDER — GUAIFENESIN-DM 100-10 MG/5ML PO SYRP: 5.0000 mL | ORAL_SOLUTION | ORAL | 0 refills | Status: DC | PRN

## 2018-10-24 MED ORDER — FLUTICASONE-SALMETEROL 250-50 MCG/DOSE IN AEPB: 1.0000 | INHALATION_SPRAY | Freq: Two times a day (BID) | RESPIRATORY_TRACT | 11 refills | Status: DC

## 2018-10-24 MED ORDER — ALPRAZOLAM 0.5 MG PO TABS
1.0000 mg | ORAL_TABLET | Freq: Once | ORAL | Status: AC
  Administered 2018-10-24: 1 mg via ORAL
  Filled 2018-10-24: qty 2

## 2018-10-24 MED ORDER — AMOXICILLIN-POT CLAVULANATE 875-125 MG PO TABS: 1.0000 | ORAL_TABLET | Freq: Two times a day (BID) | ORAL | 0 refills | Status: AC

## 2018-10-24 MED ORDER — IPRATROPIUM-ALBUTEROL 0.5-2.5 (3) MG/3ML IN SOLN: 3.0000 mL | RESPIRATORY_TRACT | 1 refills | Status: DC | PRN

## 2018-10-24 NOTE — Care Management (Signed)
Patient discharged home today.  RNCM confirmed with [facility] that patient has 2 L continuous O2. Per MD there is no indication for home health services at discharge.  Prior to discharge patient was maintaining sats on RA.

## 2018-10-24 NOTE — Discharge Summary (Signed)
[facility] [place] [facility], 72 y.o., DOB 05/16/1946, MRN [id number]. Admission date: 10/21/2018 Discharge Date 10/24/2018 Primary MD Albina Billet, MD Admitting Physician Lance Coon, MD  Admission Diagnosis  HCAP (healthcare-associated pneumonia) [J18.9]  Discharge Diagnosis   Principal Problem:   Sepsis ([place]) due to pneumonia Acute on chronic COPD exacerbation   HLD (hyperlipidemia)   Hypothyroidism   Anxiety and depression          [name]  is a 72 y.o. female who presents with chief complaint as above.  Patient presents with couple days of worsening shortness of breath, cough with some sputum production.  Patient was noted to have hypoxia and was admitted for treatment of pneumonia and COPD exacerbation.  Patient was treated with antibiotics.  Her breathing is improving.  Patient stable for discharge to home.            Consults  None  Significant Tests:  See full reports for all details     Dg Chest 2 View  Result Date: 10/21/2018 CLINICAL DATA:  Productive cough EXAM: CHEST - 2 VIEW COMPARISON:  08/17/2018 FINDINGS: There is hyperinflation of the lungs compatible with COPD. Heart and mediastinal contours are within normal limits. No focal opacities or effusions. No acute bony abnormality. IMPRESSION: COPD.  No active disease. Electronically Signed   By: Rolm Baptise M.D.   On: 10/21/2018 21:16   Ct Angio Chest Pe W And/or Wo Contrast  Result Date: 10/21/2018 CLINICAL DATA:  Shortness of breath. Recent treatment for pneumonia. EXAM: CT ANGIOGRAPHY CHEST WITH CONTRAST TECHNIQUE: Multidetector CT imaging of the chest was performed using the standard protocol during bolus administration of intravenous contrast. Multiplanar CT image reconstructions and MIPs were obtained to evaluate the vascular anatomy. CONTRAST:  14mL OMNIPAQUE IOHEXOL 350 MG/ML SOLN COMPARISON:  Plain film earlier today. FINDINGS:  Cardiovascular: Mild cardiomegaly. Calcifications throughout the coronary arteries. Scattered aortic calcifications. No aneurysm. No filling defects in the pulmonary arteries to suggest pulmonary emboli. Mediastinum/Nodes: Mildly enlarged right paratracheal node measuring 11 mm. Borderline right hilar lymph nodes. Right hilar lymph node has a short axis diameter of 10 mm. Small hiatal hernia. Lungs/Pleura: Moderate emphysema. Airspace opacity in the posterior right upper lobe could reflect early infiltrate or scarring. No effusions. Upper Abdomen: Prior cholecystectomy. Intrahepatic and extrahepatic biliary ductal dilatation. The common bile duct measures up to 12 mm. Fullness of the adrenal glands bilaterally. Musculoskeletal: Chest wall soft tissues are unremarkable. Mild compression through the the superior endplate of U72 and inferior endplate of T6. Review of the MIP images confirms the above findings. IMPRESSION: No evidence of pulmonary embolus. Mildly prominent mediastinal and right hilar lymph nodes. These may be reactive. These could be followed with repeat chest CT with IV contrast in 6 months to ensure stability or resolution. Airspace opacity in the posterior inferior right upper lobe could reflect scarring or early pneumonia. Intrahepatic and extrahepatic biliary ductal dilatation. This may be related to patient's age and post cholecystectomy state. Recommend correlation with LFTs. Aortic Atherosclerosis (ICD10-I70.0) and Emphysema (ICD10-J43.9). Electronically Signed   By: Rolm Baptise M.D.   On: 10/21/2018 23:22       Today   Subjective:   [name] patient doing much better breathing much improved Objective:   Blood pressure 121/72, pulse (!) 115, temperature 98.7 F (37.1 C), temperature source Oral, resp. rate 18, height 5\' 4"  (1.626 m), weight 59.8 kg, SpO2 92 %.  Marland Kitchen  Intake/Output Summary (Last 24 hours) at 10/24/2018 1332 Last data filed at 10/24/2018 1124 Gross per 24 hour   Intake 1034.72 ml  Output 900 ml  Net 134.72 ml    Exam VITAL SIGNS: Blood pressure 121/72, pulse (!) 115, temperature 98.7 F (37.1 C), temperature source Oral, resp. rate 18, height 5\' 4"  (1.626 m), weight 59.8 kg, SpO2 92 %.  GENERAL:  72 y.o.-year-old patient lying in the bed with no acute distress.  EYES: Pupils equal, round, reactive to light and accommodation. No scleral icterus. Extraocular muscles intact.  HEENT: Head atraumatic, normocephalic. Oropharynx and nasopharynx clear.  NECK:  Supple, no jugular venous distention. No thyroid enlargement, no tenderness.  LUNGS: Normal breath sounds bilaterally, no wheezing, rales,rhonchi or crepitation. No use of accessory muscles of respiration.  CARDIOVASCULAR: S1, S2 normal. No murmurs, rubs, or gallops.  ABDOMEN: Soft, nontender, nondistended. Bowel sounds present. No organomegaly or mass.  EXTREMITIES: No pedal edema, cyanosis, or clubbing.  NEUROLOGIC: Cranial nerves II through XII are intact. Muscle strength 5/5 in all extremities. Sensation intact. Gait not checked.  PSYCHIATRIC: The patient is alert and oriented x 3.  SKIN: No obvious rash, lesion, or ulcer.   Data Review     CBC w Diff:  Lab Results  Component Value Date   WBC 12.7 (H) 10/24/2018   HGB 10.9 (L) 10/24/2018   HCT 34.8 (L) 10/24/2018   PLT 340 10/24/2018   LYMPHOPCT 17 10/21/2018   MONOPCT 4 10/21/2018   EOSPCT 1 10/21/2018   BASOPCT 1 10/21/2018   CMP:  Lab Results  Component Value Date   NA 144 10/24/2018   NA 144 06/05/2017   NA 138 02/10/2014   K 3.8 10/24/2018   K 4.5 02/10/2014   CL 110 10/24/2018   CL 106 02/10/2014   CO2 27 10/24/2018   CO2 28 02/10/2014   BUN 14 10/24/2018   BUN 14 06/05/2017   BUN 6 (L) 02/10/2014   CREATININE 0.60 10/24/2018   CREATININE 0.89 02/10/2014   PROT 7.5 10/21/2018   PROT 6.3 06/05/2017   PROT 6.7 02/10/2014   ALBUMIN 4.0 10/21/2018   ALBUMIN 3.6 06/05/2017   ALBUMIN 2.8 (L) 02/10/2014    BILITOT 0.4 10/21/2018   BILITOT <0.2 06/05/2017   BILITOT 0.2 02/10/2014   ALKPHOS 101 10/21/2018   ALKPHOS 126 (H) 02/10/2014   AST 25 10/21/2018   AST 19 02/10/2014   ALT 20 10/21/2018   ALT 15 02/10/2014  .  Micro Results Recent Results (from the past 240 hour(s))  Culture, blood (routine x 2)     Status: None (Preliminary result)   Collection Time: 10/21/18  9:02 PM  Result Value Ref Range Status   Specimen Description BLOOD LEFT FA  Final   Special Requests   Final    BOTTLES DRAWN AEROBIC AND ANAEROBIC Blood Culture adequate volume   Culture   Final    NO GROWTH 3 DAYS Performed at [facility], [address], [place], [place] [address]    Report Status PENDING  Incomplete  Culture, blood (routine x 2)     Status: None (Preliminary result)   Collection Time: 10/21/18 10:12 PM  Result Value Ref Range Status   Specimen Description BLOOD LEFT HAND  Final   Special Requests   Final    BOTTLES DRAWN AEROBIC AND ANAEROBIC Blood Culture adequate volume   Culture   Final    NO GROWTH 3 DAYS Performed at [facility], [address] [place], [place] [address]    Report Status PENDING  Incomplete  MRSA PCR Screening     Status: None   Collection Time: 10/22/18  8:00 AM  Result Value Ref Range Status   MRSA by PCR NEGATIVE NEGATIVE Final    Comment:        The GeneXpert MRSA Assay (FDA approved for NASAL specimens only), is one component of a comprehensive MRSA colonization surveillance program. It is not intended to diagnose MRSA infection nor to guide or monitor treatment for MRSA infections. Performed at [facility], [address], [place], [place] [address]         Code Status Orders  (From admission, onward)         Start     Ordered   10/22/18 0226  Full code  Continuous     10/22/18 0225        Code Status History    Date Active Date Inactive Code Status Order ID Comments User Context   08/18/2018 0248 08/21/2018  2036 Full Code [id number]  Amelia Jo, MD Inpatient   10/01/2016 1138 10/04/2016 1744 Full Code [id number]  Fritzi Mandes, MD Inpatient          Follow-up Information    Albina Billet, MD. Go on 11/04/2018.   Specialty:  Internal Medicine Why:  at 3:15pm for hospital follow-up Contact information: [address] [place] [address] [phone]           Discharge Medications   Allergies as of 10/24/2018   No Known Allergies     Medication List    TAKE these medications   ALPRAZolam 1 MG tablet Commonly known as:  XANAX Take 1 tablet (1 mg total) by mouth 4 (four) times daily as needed. What changed:  when to take this Notes to patient:  Last dose given today at 11:15am   amLODipine 5 MG tablet Commonly known as:  NORVASC Take 1 tablet (5 mg total) by mouth daily.   amoxicillin-clavulanate 875-125 MG tablet Commonly known as:  AUGMENTIN Take 1 tablet by mouth 2 (two) times daily for 5 days.   buPROPion 150 MG 12 hr tablet Commonly known as:  WELLBUTRIN SR Take 150 mg by mouth daily.   COMBIVENT RESPIMAT 20-100 MCG/ACT Aers respimat Generic drug:  Ipratropium-Albuterol Inhale 1 puff into the lungs every 6 (six) hours as needed for wheezing or shortness of breath. What changed:  Another medication with the same name was added. Make sure you understand how and when to take each. Notes to patient:  Last dose given today at 11:05am   ipratropium-albuterol 0.5-2.5 (3) MG/3ML Soln Commonly known as:  DUONEB Take 3 mLs by nebulization every 4 (four) hours as needed. What changed:  You were already taking a medication with the same name, and this prescription was added. Make sure you understand how and when to take each.   docusate sodium 100 MG capsule Commonly known as:  COLACE Take 1 capsule (100 mg total) by mouth 2 (two) times daily. What changed:  when to take this   escitalopram 10 MG tablet Commonly known as:  LEXAPRO Take 10 mg by mouth  daily.   Fluticasone-Salmeterol 250-50 MCG/DOSE Aepb Commonly known as:  ADVAIR Inhale 1 puff into the lungs 2 (two) times daily.   guaiFENesin-dextromethorphan 100-10 MG/5ML syrup Commonly known as:  ROBITUSSIN DM Take 5 mLs by mouth every 4 (four) hours as needed for cough.   levothyroxine 88 MCG  tablet Commonly known as:  SYNTHROID, LEVOTHROID Take 88 mcg by mouth daily before breakfast.   magnesium oxide 400 MG tablet Commonly known as:  MAG-OX Take 1 tablet (400 mg total) by mouth 2 (two) times daily.   montelukast 10 MG tablet Commonly known as:  SINGULAIR Take 10 mg by mouth daily.   oxyCODONE 5 MG immediate release tablet Commonly known as:  Oxy IR/ROXICODONE Take 1 tablet (5 mg total) by mouth every 6 (six) hours as needed for severe pain. Start taking on:  11/29/2018 What changed:  Another medication with the same name was removed. Continue taking this medication, and follow the directions you see here. Notes to patient:  Last dose given today at 11am   predniSONE 10 MG (21) Tbpk tablet Commonly known as:  STERAPRED UNI-PAK 21 TAB Start at 60mg  taper by 10mg  until finish   tizanidine 2 MG capsule Commonly known as:  ZANAFLEX Take 1 capsule (2 mg total) by mouth 3 (three) times daily as needed for muscle spasms. What changed:  when to take this   traZODone 100 MG tablet Commonly known as:  DESYREL Take 1 tablet (100 mg total) by mouth at bedtime.            Durable Medical Equipment  (From admission, onward)         Start     Ordered   10/24/18 1113  For home use only DME Nebulizer machine  Once    Question:  Patient needs a nebulizer to treat with the following condition  Answer:  COPD with acute exacerbation ([place])   10/24/18 1113   10/24/18 1113  DME Oxygen  Once    Question Answer Comment  Mode or (Route) Nasal cannula   Liters per Minute 2   Frequency Continuous (stationary and portable oxygen unit needed)   Oxygen delivery system Gas       10/24/18 1113             Total Time in preparing paper work, data evaluation and todays exam - 68 minutes  Dustin Flock M.D on 10/24/2018 at 1:32 PM [place]  [phone]

## 2018-10-26 LAB — CULTURE, BLOOD (ROUTINE X 2)
Culture: NO GROWTH
Culture: NO GROWTH
Special Requests: ADEQUATE
Special Requests: ADEQUATE

## 2018-11-22 DIAGNOSIS — J441 Chronic obstructive pulmonary disease with (acute) exacerbation: Secondary | ICD-10-CM | POA: Diagnosis not present

## 2018-12-03 DIAGNOSIS — E785 Hyperlipidemia, unspecified: Secondary | ICD-10-CM | POA: Diagnosis not present

## 2018-12-03 DIAGNOSIS — E038 Other specified hypothyroidism: Secondary | ICD-10-CM | POA: Diagnosis not present

## 2018-12-03 DIAGNOSIS — I10 Essential (primary) hypertension: Secondary | ICD-10-CM | POA: Diagnosis not present

## 2018-12-03 DIAGNOSIS — J449 Chronic obstructive pulmonary disease, unspecified: Secondary | ICD-10-CM | POA: Diagnosis not present

## 2018-12-23 DIAGNOSIS — J441 Chronic obstructive pulmonary disease with (acute) exacerbation: Secondary | ICD-10-CM | POA: Diagnosis not present

## 2018-12-26 ENCOUNTER — Encounter: Payer: Medicare HMO | Admitting: Nurse Practitioner

## 2018-12-30 ENCOUNTER — Encounter: Payer: Self-pay | Admitting: Nurse Practitioner

## 2018-12-30 ENCOUNTER — Ambulatory Visit: Payer: Medicare HMO | Attending: Nurse Practitioner | Admitting: Nurse Practitioner

## 2018-12-30 ENCOUNTER — Other Ambulatory Visit: Payer: Self-pay

## 2018-12-30 VITALS — BP 111/78 | HR 108 | Temp 98.0°F | Ht 64.0 in | Wt 131.0 lb

## 2018-12-30 DIAGNOSIS — M7918 Myalgia, other site: Secondary | ICD-10-CM | POA: Insufficient documentation

## 2018-12-30 DIAGNOSIS — G8929 Other chronic pain: Secondary | ICD-10-CM

## 2018-12-30 DIAGNOSIS — G894 Chronic pain syndrome: Secondary | ICD-10-CM | POA: Insufficient documentation

## 2018-12-30 DIAGNOSIS — M47812 Spondylosis without myelopathy or radiculopathy, cervical region: Secondary | ICD-10-CM | POA: Diagnosis not present

## 2018-12-30 DIAGNOSIS — M533 Sacrococcygeal disorders, not elsewhere classified: Secondary | ICD-10-CM | POA: Diagnosis not present

## 2018-12-30 DIAGNOSIS — R790 Abnormal level of blood mineral: Secondary | ICD-10-CM | POA: Diagnosis not present

## 2018-12-30 DIAGNOSIS — M47816 Spondylosis without myelopathy or radiculopathy, lumbar region: Secondary | ICD-10-CM | POA: Diagnosis not present

## 2018-12-30 MED ORDER — TIZANIDINE HCL 2 MG PO CAPS: 2.0000 mg | ORAL_CAPSULE | Freq: Three times a day (TID) | ORAL | 2 refills | Status: DC | PRN

## 2018-12-30 MED ORDER — OXYCODONE HCL 5 MG PO TABS: 5.0000 mg | ORAL_TABLET | Freq: Four times a day (QID) | ORAL | 0 refills | Status: DC | PRN

## 2018-12-30 MED ORDER — MAGNESIUM OXIDE 400 MG PO TABS: 400.0000 mg | ORAL_TABLET | Freq: Two times a day (BID) | ORAL | 0 refills | Status: DC

## 2018-12-30 NOTE — Progress Notes (Signed)
Nursing Pain Medication Assessment:  Safety precautions to be maintained throughout the outpatient stay will include: orient to surroundings, keep bed in low position, maintain call bell within reach at all times, provide assistance with transfer out of bed and ambulation.  Medication Inspection Compliance: Pill count conducted under aseptic conditions, in front of the patient. Neither the pills nor the bottle was removed from the patient's sight at any time. Once count was completed pills were immediately returned to the patient in their original bottle.  Medication: Oxycodone IR Pill/Patch Count: 0 of 120 pills remain Pill/Patch Appearance: Markings consistent with prescribed medication Bottle Appearance: Standard pharmacy container. Clearly labeled. Filled Date: 1 / 10 / 2020 Last Medication intake:  Yesterday

## 2018-12-30 NOTE — Progress Notes (Signed)
Patient's Name: [name]  MRN: [id number]  Referring Provider: Albina Billet, MD  DOB: May 04, 1946  PCP: Albina Billet, MD  DOS: 12/30/2018  Note by: Dionisio David, NP  Service setting: Ambulatory outpatient  Specialty: Interventional Pain Management  Location: [facility] (AMB) Pain Management Facility    Patient type: Established   HPI  Reason for Visit: [name] is a 73 y.o. year old, female patient, who comes today with a chief complaint of Back Pain Last Appointment: She was last seen by me on 09/30/2018. Pain Assessment: Today, [name] describes the severity of the Chronic pain as a 6 /10. She indicates the location/referral of the pain to be Back Mid/Pain radiaties down down both leg. Onset was: More than a month ago. The quality of pain is described as Aching, Burning, Constant, Throbbing. Temporal description, or timing of pain is: Constant. Possible modifying factors: medications and laying down. [name] describes the pain effects on ADL as: limits my daily activities.  [name]  height is 5' 4" (1.626 m) and weight is 131 lb (59.4 kg). Her temperature is 98 F (36.7 C). Her blood pressure is 111/78 and her pulse is 108 (abnormal). Her oxygen saturation is 94%.  She has had a recent fall.Sjhe did not seek treatment. She is doing well today and is in with a caregiver. She is concern about her current regimen. She does not feel like it is as effective as morphine. She would like to return to this. She is aware of drug holiday however she is not interested in this at all.   Controlled Substance Pharmacotherapy Assessment REMS (Risk Evaluation and Mitigation Strategy)  Analgesic:Oxycodone 64m QID MME/day:316mday. Alison FischerRN  12/30/2018  1:16 PM  Sign when Signing Visit Nursing Pain Medication Assessment:  Safety precautions to be maintained throughout the outpatient stay will include: orient to surroundings, keep bed in low position, maintain call bell within reach at  all times, provide assistance with transfer out of bed and ambulation.  Medication Inspection Compliance: Pill count conducted under aseptic conditions, in front of the patient. Neither the pills nor the bottle was removed from the patient's sight at any time. Once count was completed pills were immediately returned to the patient in their original bottle.  Medication: Oxycodone IR Pill/Patch Count: 0 of 120 pills remain Pill/Patch Appearance: Markings consistent with prescribed medication Bottle Appearance: Standard pharmacy container. Clearly labeled. Filled Date: 1 / 10 / 2020 Last Medication intake:  Yesterday   Pharmacokinetics: Liberation and absorption (onset of action): WNL Distribution (time to peak effect): WNL Metabolism and excretion (duration of action): WNL         Pharmacodynamics: Desired effects: Analgesia: Ms. [place] <50% benefit. Functional ability: Patient reports that medication does help, but not nearly as much as she would like Clinically meaningful improvement in function (CMIF): Sustained CMIF goals met Perceived effectiveness: Described as relatively effective but with some room for improvement Undesirable effects: Side-effects or Adverse reactions: None reported Monitoring: [place] PMP: Online review of the past 1281-monthriod conducted. Compliant with practice rules and regulations Last UDS on record: Summary  Date Value Ref Range Status  09/30/2018 FINAL  Final    Comment:    ==================================================================== TOXAS[facility] SELSELECT 39W) ==================================================================== Test                             Result       Flag  Units Drug Present and Declared for Prescription Verification   Alprazolam                     219          EXPECTED   ng/mg creat   Alpha-hydroxyalprazolam        1298         EXPECTED   ng/mg creat    Source of alprazolam is a scheduled prescription  medication.    Alpha-hydroxyalprazolam is an expected metabolite of alprazolam.   Oxycodone                      50           EXPECTED   ng/mg creat   Oxymorphone                    63           EXPECTED   ng/mg creat    Sources of oxycodone include scheduled prescription medications.    Oxymorphone is an expected metabolite of oxycodone. Oxymorphone    is also available as a scheduled prescription medication. Drug Absent but Declared for Prescription Verification   Hydrocodone                    Not Detected UNEXPECTED ng/mg creat ==================================================================== Test                      Result    Flag   Units      Ref Range   Creatinine              127              mg/dL      >=20 ==================================================================== Declared Medications:  The flagging and interpretation on this report are based on the  following declared medications.  Unexpected results may arise from  inaccuracies in the declared medications.  **Note: The testing scope of this panel includes these medications:  Alprazolam  Hydrocodone (Norco)  Oxycodone  **Note: The testing scope of this panel does not include following  reported medications:  Acetaminophen (Norco)  Albuterol (Combivent)  Amlodipine  Bupropion  Docusate (Colace)  Escitalopram (Lexapro)  Fluticasone (Advair)  Ipratropium (Combivent)  Levothyroxine  Magnesium Oxide  Salmeterol (Advair)  Tizanidine  Trazodone  Vitamin D3 ==================================================================== For clinical consultation, please call [phone]. ====================================================================    UDS interpretation: Unexpected findings:         Pt is on Oxycodone no Norco suspected. Medication Assessment Form: Reviewed. Patient indicates being compliant with therapy Treatment compliance: Compliant Risk Assessment Profile: Aberrant behavior: See  initial evaluations. None observed or detected today Comorbid factors increasing risk of overdose: See initial evaluation. No additional risks detected today Opioid risk tool (ORT):  Opioid Risk  12/30/2018  Alcohol 0  Illegal Drugs 0  Rx Drugs 0  Alcohol 0  Illegal Drugs 0  Rx Drugs 0  Age between 16-45 years  0  History of Preadolescent Sexual Abuse 0  Psychological Disease 0  ADD -  OCD -  Bipolar -  Depression 0  Opioid Risk Tool Scoring 0  Opioid Risk Interpretation Low Risk    ORT Scoring interpretation table:  Score <3 = Low Risk for SUD  Score between 4-7 = Moderate Risk for SUD  Score >8 = High Risk for Opioid Abuse   Risk of substance use disorder (SUD): Moderate-to-High  Risk Mitigation Strategies:  Patient Counseling: Covered Patient-Prescriber Agreement (PPA): Present and active  Notification to other healthcare providers: Done  Pharmacologic Plan: No change in therapy, at this time.             ROS  Constitutional: Denies any fever or chills Gastrointestinal: No reported hemesis, hematochezia, vomiting, or acute GI distress Musculoskeletal: Denies any acute onset joint swelling, redness, loss of ROM, or weakness Neurological: No reported episodes of acute onset apraxia, aphasia, dysarthria, agnosia, amnesia, paralysis, loss of coordination, or loss of consciousness  Medication Review  ALPRAZolam, Fluticasone-Salmeterol, Ipratropium-Albuterol, amLODipine, buPROPion, escitalopram, ipratropium-albuterol, levothyroxine, magnesium oxide, montelukast, oxyCODONE, tizanidine, and traZODone  History Review  Allergy: [name] has No Known Allergies. Drug: [name]  reports no history of drug use. Alcohol:  reports no history of alcohol use. Tobacco:  reports that she has been smoking cigarettes. She has been smoking about 0.50 packs per day. She has never used smokeless tobacco. Social: [name]  reports that she has been smoking cigarettes. She has been smoking  about 0.50 packs per day. She has never used smokeless tobacco. She reports that she does not drink alcohol or use drugs. Medical:  has a past medical history of Anxiety, CAD (coronary artery disease), Carpal tunnel syndrome, Cataract, Complication of anesthesia, COPD (chronic obstructive pulmonary disease) ([place]), CRP elevated (09/14/2015), Depression, Difficult intubation, Dyslipidemia, Dyspnea, Dysrhythmia, Elevated sedimentation rate (09/14/2015), Esophageal spasm, Gastrointestinal parasites, GERD (gastroesophageal reflux disease), Hiatal hernia, History of peptic ulcer disease, Hyperthyroidism, Hypothyroidism, Low magnesium levels (09/14/2015), Pelvic fracture ([place]) (2008), Reflux, Rotator cuff injury, and Stenosis, spinal, lumbar. Surgical: [name]  has a past surgical history that includes Cesarean section; Neck surgery; Cholecystectomy; Carpal tunnel release; Rotator cuff repair; Shoulder arthroscopy (12/07/2011); Shoulder surgery (12/07/2011); Intramedullary (im) nail intertrochanteric (Right, 10/01/2016); Fracture surgery; Appendectomy; Nose surgery; Back surgery; Cataract extraction w/PHACO (Right, 08/07/2017); Hip surgery; Cataract extraction w/PHACO (Left, 09/04/2017); and Kyphoplasty (N/A, 08/20/2018). Family: family history includes Aneurysm in an other family member. Problem List: 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); Cervical facet syndrome (Bilateral) (R>L); Cervical myofascial pain syndrome; Lumbar spondylosis; Chronic shoulder impingement syndrome (Right); Chronic obstructive pulmonary disease (COPD) ([place] [name]); Chronic shoulder pain (Right); Chronic shoulder pain (Bilateral) (status post multiple surgeries) (R>L); Cervical facet hypertrophy (Bilateral); History of shoulder surgery 5 (Right); Lumbar foraminal stenosis (L3-4) (Left); 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  Lab Review  Kidney Function Lab Results  Component Value Date   BUN 14 10/24/2018   CREATININE 0.60 10/24/2018   BCR 21 06/05/2017   GFRAA >60 10/24/2018   GFRNONAA >60 10/24/2018  Liver Function Lab Results  Component Value Date   AST 25 10/21/2018   ALT 20 10/21/2018   ALBUMIN 4.0 10/21/2018  Note: Above Lab results reviewed.  Imaging Review  Note: Above imaging results reviewed.        Physical Exam  General appearance: Well nourished, well developed, and well hydrated. In no apparent acute distress Mental status: Alert, oriented x 3 (person, place, & time)       Respiratory: No evidence of acute respiratory distress Eyes: PERLA Vitals: BP 111/78   Pulse (!) 108   Temp 98 F (36.7 C)   Ht 5'  4" (1.626 m)   Wt 131 lb (59.4 kg)   SpO2 94%   BMI 22.49 kg/m  BMI: Estimated body mass index is 22.49 kg/m as calculated from the following:   Height as of this encounter: 5' 4" (1.626 m).   Weight as of this encounter: 131 lb (59.4 kg). Ideal: Ideal body weight: 54.7 kg (120 lb 9.5 oz) Adjusted ideal body weight: 56.6 kg (124 lb 12.1 oz) Lumbar Spine Area Exam  Skin & Axial Inspection: No masses, redness, or swelling Alignment: Symmetrical Functional ROM: Unrestricted ROM       Stability: No instability detected Muscle Tone/Strength: Functionally intact. No obvious neuro-muscular anomalies detected. Sensory (Neurological): Unimpaired Palpation: Tender       Provocative Tests: Hyperextension/rotation test: Positive       Lumbar quadrant test (Kemp's test): deferred today       Lateral bending test: deferred today        Lower Extremity Exam    Side: Right lower extremity  Side: Left lower extremity  Stability: No instability observed           Stability: No instability observed          Skin & Extremity Inspection: Skin color, temperature, and hair growth are WNL. No peripheral edema or cyanosis. No masses, redness, swelling, asymmetry, or associated skin lesions. No contractures.  Skin & Extremity Inspection: Skin color, temperature, and hair growth are WNL. No peripheral edema or cyanosis. No masses, redness, swelling, asymmetry, or associated skin lesions. No contractures.  Functional ROM: Unrestricted ROM                  Functional ROM: Unrestricted ROM                  Muscle Tone/Strength: Functionally intact. No obvious neuro-muscular anomalies detected.  Muscle Tone/Strength: Functionally intact. No obvious neuro-muscular anomalies detected.  Sensory (Neurological): Dermatomal pain pattern        Sensory (Neurological): Dermatomal pain pattern        Palpation: No palpable anomalies  Palpation: No palpable anomalies   Assessment   Status Diagnosis  Persistent Controlled Controlled 1. Lumbar spondylosis   2. Myofascial pain syndrome, cervical   3. Cervical spondylosis   4. Chronic sacroiliac joint pain (Right)   5. Low magnesium levels   6. Chronic pain syndrome      Updated Problems: No problems updated.  Plan of Care  Medications: I have discontinued [place] Sign[place] A. [place]'s docusate sodium, guaiFENesin-dextromethorphan, predniSONE, and oxycodone. I have also changed her oxyCODONE, oxyCODONE, and oxyCODONE. Additionally, I am having her maintain her ALPRAZolam, buPROPion, escitalopram, levothyroxine, Ipratropium-Albuterol, traZODone, amLODipine, montelukast, ipratropium-albuterol, Fluticasone-Salmeterol, magnesium oxide, and tizanidine.  Administered today: [name]. [name] had no medications administered during this visit.  Orders:  No orders of the defined types were placed in this encounter.  Interventional management options: Planned, scheduled, and/or pending: Not at this time.   Considering: Diagnostic  right-sidedlumbarfacet + right-sided sacroiliac joint block#2 Diagnostic right-sided sacroiliac joint block  Possible right sided sacroiliac joint RFA Diagnostic right-sided lumbar facet block  Possible right-sided lumbar facet RFA Diagnostic bilateral cervical facetblock  Possible bilateral cervical facet radiofrequencyablation  Diagnostic right-sided cervical epidural steroid injection  Diagnostic bilateral intra-articular shoulderjoint injection  Diagnostic bilateral suprascapularnerve block  Possible bilateral suprascapular nerve radiofrequencyablation  Diagnostic left-sided L3-4 transforaminal epiduralsteroid injection  Diagnostic L3-4 versus L4-5 lumbar epiduralsteroid injection    Palliative PRN treatment(s): Palliative right-sided sacroiliac joint block Palliative right-sided lumbar facet block  Note by: Dionisio David, NP Date: 12/30/2018; Time: 10:07 AM

## 2018-12-30 NOTE — Patient Instructions (Signed)
____________________________________________________________________________________________  Medication Rules  Purpose: To inform patients, and their family members, of our rules and regulations.  Applies to: All patients receiving prescriptions (written or electronic).  Pharmacy of record: Pharmacy where electronic prescriptions will be sent. If written prescriptions are taken to a different pharmacy, please inform the nursing staff. The pharmacy listed in the electronic medical record should be the one where you would like electronic prescriptions to be sent.  Electronic prescriptions: In compliance with the [place] Strengthen Opioid Misuse Prevention (STOP) Act of 2017 (Session Law 2017-74/H243), effective November 20, 2018, all controlled substances must be electronically prescribed. Calling prescriptions to the pharmacy will cease to exist.  Prescription refills: Only during scheduled appointments. Applies to all prescriptions.  NOTE: The following applies primarily to controlled substances (Opioid* Pain Medications).   Patient's responsibilities: 1. Pain Pills: Bring all pain pills to every appointment (except for procedure appointments). 2. Pill Bottles: Bring pills in original pharmacy bottle. Always bring the newest bottle. Bring bottle, even if empty. 3. Medication refills: You are responsible for knowing and keeping track of what medications you take and those you need refilled. The day before your appointment: write a list of all prescriptions that need to be refilled. The day of the appointment: give the list to the admitting nurse. Prescriptions will be written only during appointments. No prescriptions will be written on procedure days. If you forget a medication: it will not be "Called in", "Faxed", or "electronically sent". You will need to get another appointment to get these prescribed. No early refills. Do not call asking to have your prescription filled  early. 4. Prescription Accuracy: You are responsible for carefully inspecting your prescriptions before leaving our office. Have the discharge nurse carefully go over each prescription with you, before taking them home. Make sure that your name is accurately spelled, that your address is correct. Check the name and dose of your medication to make sure it is accurate. Check the number of pills, and the written instructions to make sure they are clear and accurate. Make sure that you are given enough medication to last until your next medication refill appointment. 5. Taking Medication: Take medication as prescribed. When it comes to controlled substances, taking less pills or less frequently than prescribed is permitted and encouraged. Never take more pills than instructed. Never take medication more frequently than prescribed.  6. Inform other Doctors: Always inform, all of your healthcare providers, of all the medications you take. 7. Pain Medication from other Providers: You are not allowed to accept any additional pain medication from any other Doctor or Healthcare provider. There are two exceptions to this rule. (see below) In the event that you require additional pain medication, you are responsible for notifying us, as stated below. 8. Medication Agreement: You are responsible for carefully reading and following our Medication Agreement. This must be signed before receiving any prescriptions from our practice. Safely store a copy of your signed Agreement. Violations to the Agreement will result in no further prescriptions. (Additional copies of our Medication Agreement are available upon request.) 9. Laws, Rules, & Regulations: All patients are expected to follow all Federal and State Laws, Statutes, Rules, & Regulations. Ignorance of the Laws does not constitute a valid excuse. The use of any illegal substances is prohibited. 10. Adopted CDC guidelines & recommendations: Target dosing levels will be  at or below 60 MME/day. Use of benzodiazepines** is not recommended.  Exceptions: There are only two exceptions to the rule of not   receiving pain medications from other Healthcare Providers. 1. Exception #1 (Emergencies): In the event of an emergency (i.e.: accident requiring emergency care), you are allowed to receive additional pain medication. However, you are responsible for: As soon as you are able, call our office [phone], at any time of the day or night, and leave a message stating your name, the date and nature of the emergency, and the name and dose of the medication prescribed. In the event that your call is answered by a member of our staff, make sure to document and save the date, time, and the name of the person that took your information.  2. Exception #2 (Planned Surgery): In the event that you are scheduled by another doctor or dentist to have any type of surgery or procedure, you are allowed (for a period no longer than 30 days), to receive additional pain medication, for the acute post-op pain. However, in this case, you are responsible for picking up a copy of our "Post-op Pain Management for Surgeons" handout, and giving it to your surgeon or dentist. This document is available at our office, and does not require an appointment to obtain it. Simply go to our office during business hours (Monday-Thursday from 8:00 AM to 4:00 PM) (Friday 8:00 AM to 12:00 Noon) or if you have a scheduled appointment with us, prior to your surgery, and ask for it by name. In addition, you will need to provide us with your name, name of your surgeon, type of surgery, and date of procedure or surgery.  *Opioid medications include: morphine, codeine, oxycodone, oxymorphone, hydrocodone, hydromorphone, meperidine, tramadol, tapentadol, buprenorphine, fentanyl, methadone. **Benzodiazepine medications include: diazepam (Valium), alprazolam (Xanax), clonazepam (Klonopine), lorazepam (Ativan), clorazepate  (Tranxene), chlordiazepoxide (Librium), estazolam (Prosom), oxazepam (Serax), temazepam (Restoril), triazolam (Halcion) (Last updated: 01/17/2018) ____________________________________________________________________________________________    

## 2019-01-21 DIAGNOSIS — J441 Chronic obstructive pulmonary disease with (acute) exacerbation: Secondary | ICD-10-CM | POA: Diagnosis not present

## 2019-01-22 DIAGNOSIS — R69 Illness, unspecified: Secondary | ICD-10-CM | POA: Diagnosis not present

## 2019-02-21 DIAGNOSIS — J441 Chronic obstructive pulmonary disease with (acute) exacerbation: Secondary | ICD-10-CM | POA: Diagnosis not present

## 2019-02-21 DIAGNOSIS — R69 Illness, unspecified: Secondary | ICD-10-CM | POA: Diagnosis not present

## 2019-03-13 DIAGNOSIS — R69 Illness, unspecified: Secondary | ICD-10-CM | POA: Diagnosis not present

## 2019-03-19 ENCOUNTER — Other Ambulatory Visit: Payer: Self-pay

## 2019-03-19 ENCOUNTER — Ambulatory Visit: Payer: Medicare HMO | Attending: Nurse Practitioner | Admitting: Nurse Practitioner

## 2019-03-19 DIAGNOSIS — M533 Sacrococcygeal disorders, not elsewhere classified: Secondary | ICD-10-CM

## 2019-03-19 DIAGNOSIS — G894 Chronic pain syndrome: Secondary | ICD-10-CM

## 2019-03-19 DIAGNOSIS — G8929 Other chronic pain: Secondary | ICD-10-CM

## 2019-03-19 DIAGNOSIS — M47816 Spondylosis without myelopathy or radiculopathy, lumbar region: Secondary | ICD-10-CM | POA: Diagnosis not present

## 2019-03-19 DIAGNOSIS — M7918 Myalgia, other site: Secondary | ICD-10-CM | POA: Diagnosis not present

## 2019-03-19 DIAGNOSIS — R790 Abnormal level of blood mineral: Secondary | ICD-10-CM

## 2019-03-19 MED ORDER — OXYCODONE HCL 5 MG PO TABS: 5.0000 mg | ORAL_TABLET | Freq: Four times a day (QID) | ORAL | 0 refills | Status: DC | PRN

## 2019-03-19 MED ORDER — TIZANIDINE HCL 2 MG PO CAPS: 2.0000 mg | ORAL_CAPSULE | Freq: Three times a day (TID) | ORAL | 2 refills | Status: DC | PRN

## 2019-03-19 MED ORDER — MAGNESIUM OXIDE 400 MG PO TABS: 400.0000 mg | ORAL_TABLET | Freq: Two times a day (BID) | ORAL | 0 refills | Status: DC

## 2019-03-19 NOTE — Progress Notes (Signed)
Pain Management Encounter Note - Virtual Visit via Telephone Telehealth (real-time audio visits between healthcare provider and patient).  Patient's Phone No. & Preferred Pharmacy:  [phone] (home); [phone] (mobile); (Preferred) [phone]  [place], [place] [address] Phone: [phone] Fax: [place], [place] [address] [address] Phone: [phone] Fax: [place], [place] [address] [address] Phone: [phone] Fax: [phone]   Pre-screening note:  Our staff contacted [name] and offered her an "in person", "face-to-face" appointment versus a telephone encounter. She indicated preferring the telephone encounter, at this time.  Reason for Virtual Visit: COVID-19*  Social distancing based on CDC and AMA recommendations.   I contacted [place] on 03/19/2019 at 11:23 AM by telephone and clearly identified myself as Alison David, NP. I verified that I was speaking with the correct person using two identifiers (Name and date of birth: June 18, 1946).  Advanced Informed Consent I sought verbal advanced consent from [name] for telemedicine interactions and virtual visit. I informed [name] of the security and privacy concerns, risks, and limitations associated with performing an evaluation and management service by telephone. I also informed [name] of the availability of "in person" appointments and I informed her of the possibility of a patient responsible charge related to this service. [name] expressed understanding and agreed to proceed.   Historic Elements   [name] is a 73 y.o. year old, female patient evaluated today after her last encounter by our practice on 12/30/2018. [name]  has a past medical history of Anxiety, CAD (coronary  artery disease), Carpal tunnel syndrome, Cataract, Complication of anesthesia, COPD (chronic obstructive pulmonary disease) ([place]), CRP elevated (09/14/2015), Depression, Difficult intubation, Dyslipidemia, Dyspnea, Dysrhythmia, Elevated sedimentation rate (09/14/2015), Esophageal spasm, Gastrointestinal parasites, GERD (gastroesophageal reflux disease), Hiatal hernia, History of peptic ulcer disease, Hyperthyroidism, Hypothyroidism, Low magnesium levels (09/14/2015), Pelvic fracture ([place]) (2008), Reflux, Rotator cuff injury, and Stenosis, spinal, lumbar. She also  has a past surgical history that includes Cesarean section; Neck surgery; Cholecystectomy; Carpal tunnel release; Rotator cuff repair; Shoulder arthroscopy (12/07/2011); Shoulder surgery (12/07/2011); Intramedullary (im) nail intertrochanteric (Right, 10/01/2016); Fracture surgery; Appendectomy; Nose surgery; Back surgery; Cataract extraction w/PHACO (Right, 08/07/2017); Hip surgery; Cataract extraction w/PHACO (Left, 09/04/2017); and Kyphoplasty (N/A, 08/20/2018). [name] has a current medication list which includes the following prescription(s): alprazolam, amlodipine, bupropion, escitalopram, fluticasone-salmeterol, ipratropium-albuterol, ipratropium-albuterol, levothyroxine, magnesium oxide, montelukast, oxycodone, tizanidine, and trazodone. She  reports that she has been smoking cigarettes. She has been smoking about 0.50 packs per day. She has never used smokeless tobacco. She reports that she does not drink alcohol or use drugs. [name] has No Known Allergies.   HPI  I last saw her on 12/30/2018. She is being evaluated for medication management. She has 6/10 lower back pain. She has had several falls since right hip surgery. She feels like her pain has just changed. She states that she is under stress because she in home aide has been taking her things. She denies any new pain related concerns. She admits that she does get tired of hurting. She  would like something to make her feel better. She is currently using her vitamins as directed. She will call her PCP to see if they can help.   Pharmacotherapy Assessment  Analgesic:Oxycodone 5mg   QID MME/day:30mg /day.  Monitoring: Pharmacotherapy: No side-effects or adverse reactions reported. [place] PMP: PDMP reviewed during this encounter.       Compliance: No problems identified. Plan: Refer to "POC".  Review of recent tests  CT Angio Chest PE W and/or Wo Contrast CLINICAL DATA:  Shortness of breath. Recent treatment for pneumonia.  EXAM: CT ANGIOGRAPHY CHEST WITH CONTRAST  TECHNIQUE: Multidetector CT imaging of the chest was performed using the standard protocol during bolus administration of intravenous contrast. Multiplanar CT image reconstructions and MIPs were obtained to evaluate the vascular anatomy.  CONTRAST:  84mL OMNIPAQUE IOHEXOL 350 MG/ML SOLN  COMPARISON:  Plain film earlier today.  FINDINGS: Cardiovascular: Mild cardiomegaly. Calcifications throughout the coronary arteries. Scattered aortic calcifications. No aneurysm. No filling defects in the pulmonary arteries to suggest pulmonary emboli.  Mediastinum/Nodes: Mildly enlarged right paratracheal node measuring 11 mm. Borderline right hilar lymph nodes. Right hilar lymph node has a short axis diameter of 10 mm. Small hiatal hernia.  Lungs/Pleura: Moderate emphysema. Airspace opacity in the posterior right upper lobe could reflect early infiltrate or scarring. No effusions.  Upper Abdomen: Prior cholecystectomy. Intrahepatic and extrahepatic biliary ductal dilatation. The common bile duct measures up to 12 mm. Fullness of the adrenal glands bilaterally.  Musculoskeletal: Chest wall soft tissues are unremarkable. Mild compression through the the superior endplate of N23 and inferior endplate of T6.  Review of the MIP images confirms the above findings.  IMPRESSION: No evidence of pulmonary  embolus.  Mildly prominent mediastinal and right hilar lymph nodes. These may be reactive. These could be followed with repeat chest CT with IV contrast in 6 months to ensure stability or resolution.  Airspace opacity in the posterior inferior right upper lobe could reflect scarring or early pneumonia.  Intrahepatic and extrahepatic biliary ductal dilatation. This may be related to patient's age and post cholecystectomy state. Recommend correlation with LFTs.  Aortic Atherosclerosis (ICD10-I70.0) and Emphysema (ICD10-J43.9).  Electronically Signed   By: Rolm Baptise M.D.   On: 10/21/2018 23:22 DG Chest 2 View CLINICAL DATA:  Productive cough  EXAM: CHEST - 2 VIEW  COMPARISON:  08/17/2018  FINDINGS: There is hyperinflation of the lungs compatible with COPD. Heart and mediastinal contours are within normal limits. No focal opacities or effusions. No acute bony abnormality.  IMPRESSION: COPD.  No active disease.  Electronically Signed   By: Rolm Baptise M.D.   On: 10/21/2018 21:16   Admission on 10/21/2018, Discharged on 10/24/2018  Component Date Value Ref Range Status  . Sodium 10/21/2018 143  135 - 145 mmol/L Final  . Potassium 10/21/2018 3.3* 3.5 - 5.1 mmol/L Final  . Chloride 10/21/2018 104  98 - 111 mmol/L Final  . CO2 10/21/2018 29  22 - 32 mmol/L Final  . Glucose, Bld 10/21/2018 115* 70 - 99 mg/dL Final  . BUN 10/21/2018 21  8 - 23 mg/dL Final  . Creatinine, Ser 10/21/2018 0.66  0.44 - 1.00 mg/dL Final  . Calcium 10/21/2018 9.0  8.9 - 10.3 mg/dL Final  . Total Protein 10/21/2018 7.5  6.5 - 8.1 g/dL Final  . Albumin 10/21/2018 4.0  3.5 - 5.0 g/dL Final  . AST 10/21/2018 25  15 - 41 U/L Final  . ALT 10/21/2018 20  0 - 44 U/L Final  . Alkaline Phosphatase 10/21/2018 101  38 - 126 U/L Final  . Total Bilirubin 10/21/2018 0.4  0.3 - 1.2 mg/dL Final  . GFR calc non Af Amer 10/21/2018 >60  >60 mL/min Final  .  GFR calc Af Amer 10/21/2018 >60  >60 mL/min Final  .  Anion gap 10/21/2018 10  5 - 15 Final   Performed at [facility], [address] [place], [place] [address]  . WBC 10/21/2018 16.6* 4.0 - 10.5 K/uL Final  . RBC 10/21/2018 4.42  3.87 - 5.11 MIL/uL Final  . Hemoglobin 10/21/2018 13.1  12.0 - 15.0 g/dL Final  . HCT 10/21/2018 41.5  36.0 - 46.0 % Final  . MCV 10/21/2018 93.9  80.0 - 100.0 fL Final  . MCH 10/21/2018 29.6  26.0 - 34.0 pg Final  . MCHC 10/21/2018 31.6  30.0 - 36.0 g/dL Final  . RDW 10/21/2018 13.3  11.5 - 15.5 % Final  . Platelets 10/21/2018 523* 150 - 400 K/uL Final  . nRBC 10/21/2018 0.0  0.0 - 0.2 % Final  . Neutrophils Relative % 10/21/2018 76  % Final  . Neutro Abs 10/21/2018 12.9* 1.7 - 7.7 K/uL Final  . Lymphocytes Relative 10/21/2018 17  % Final  . Lymphs Abs 10/21/2018 2.8  0.7 - 4.0 K/uL Final  . Monocytes Relative 10/21/2018 4  % Final  . Monocytes Absolute 10/21/2018 0.6  0.1 - 1.0 K/uL Final  . Eosinophils Relative 10/21/2018 1  % Final  . Eosinophils Absolute 10/21/2018 0.1  0.0 - 0.5 K/uL Final  . Basophils Relative 10/21/2018 1  % Final  . Basophils Absolute 10/21/2018 0.1  0.0 - 0.1 K/uL Final  . Immature Granulocytes 10/21/2018 1  % Final  . Abs Immature Granulocytes 10/21/2018 0.12* 0.00 - 0.07 K/uL Final   Performed at [facility], [address], [place], [place] [address]  . Color, Urine 10/21/2018 YELLOW* YELLOW Final  . APPearance 10/21/2018 CLEAR* CLEAR Final  . Specific Gravity, Urine 10/21/2018 1.029  1.005 - 1.030 Final  . pH 10/21/2018 6.0  5.0 - 8.0 Final  . Glucose, UA 10/21/2018 NEGATIVE  NEGATIVE mg/dL Final  . Hgb urine dipstick 10/21/2018 NEGATIVE  NEGATIVE Final  . Bilirubin Urine 10/21/2018 NEGATIVE  NEGATIVE Final  . Ketones, ur 10/21/2018 20* NEGATIVE mg/dL Final  . Protein, ur 10/21/2018 NEGATIVE  NEGATIVE mg/dL Final  . Nitrite 10/21/2018 NEGATIVE  NEGATIVE Final  . Leukocytes, UA 10/21/2018 NEGATIVE  NEGATIVE Final  . RBC / HPF 10/21/2018 0-5  0 - 5  RBC/hpf Final  . WBC, UA 10/21/2018 0-5  0 - 5 WBC/hpf Final  . Bacteria, UA 10/21/2018 NONE SEEN  NONE SEEN Final  . Squamous Epithelial / LPF 10/21/2018 0-5  0 - 5 Final  . Mucus 10/21/2018 PRESENT   Final   Performed at [facility], [address], [place], [place] [address]  . Troponin I 10/21/2018 0.03* <0.03 ng/mL Final   Comment: CRITICAL RESULT CALLED TO, READ BACK BY AND VERIFIED WITH DANETTE PEREZ @2150  10/21/18 [facility] Performed at [facility], [address] [place], [place] [address]   . Specimen Description 10/21/2018 BLOOD LEFT FA   Final  . Special Requests 10/21/2018 BOTTLES DRAWN AEROBIC AND ANAEROBIC Blood Culture adequate volume   Final  . Culture 10/21/2018    Final                   Value:NO GROWTH 5 DAYS Performed at [facility], [address] [place],  [address]   . Report Status 10/21/2018 10/26/2018 FINAL   Final  . Specimen Description 10/21/2018 BLOOD LEFT HAND   Final  . Special Requests 10/21/2018 BOTTLES DRAWN AEROBIC AND ANAEROBIC  Blood Culture adequate volume   Final  . Culture 10/21/2018    Final                   Value:NO GROWTH 5 DAYS Performed at [facility], [address] [place], [place] [address]   . Report Status 10/21/2018 10/26/2018 FINAL   Final  . Lactic Acid, Venous 10/21/2018 1.9  0.5 - 1.9 mmol/L Final   Performed at [facility], [address] [place], [place] [address]  . Troponin I 10/21/2018 <0.03  <0.03 ng/mL Final   Performed at [facility], [address] [place], [place] [address]  . Sodium 10/22/2018 140  135 - 145 mmol/L Final  . Potassium 10/22/2018 3.9  3.5 - 5.1 mmol/L Final  . Chloride 10/22/2018 103  98 - 111 mmol/L Final  . CO2 10/22/2018 30  22 - 32 mmol/L Final  . Glucose, Bld 10/22/2018 150* 70 - 99 mg/dL Final  . BUN 10/22/2018 17  8 - 23 mg/dL Final  . Creatinine, Ser 10/22/2018 0.63  0.44 - 1.00 mg/dL Final  . Calcium 10/22/2018 8.6* 8.9  - 10.3 mg/dL Final  . GFR calc non Af Amer 10/22/2018 >60  >60 mL/min Final  . GFR calc Af Amer 10/22/2018 >60  >60 mL/min Final  . Anion gap 10/22/2018 7  5 - 15 Final   Performed at [facility], [address], [place], [place] [address]  . WBC 10/22/2018 14.1* 4.0 - 10.5 K/uL Final  . RBC 10/22/2018 3.91  3.87 - 5.11 MIL/uL Final  . Hemoglobin 10/22/2018 11.5* 12.0 - 15.0 g/dL Final  . HCT 10/22/2018 37.2  36.0 - 46.0 % Final  . MCV 10/22/2018 95.1  80.0 - 100.0 fL Final  . MCH 10/22/2018 29.4  26.0 - 34.0 pg Final  . MCHC 10/22/2018 30.9  30.0 - 36.0 g/dL Final  . RDW 10/22/2018 13.3  11.5 - 15.5 % Final  . Platelets 10/22/2018 400  150 - 400 K/uL Final  . nRBC 10/22/2018 0.0  0.0 - 0.2 % Final   Performed at [facility], [address], [place], [place] [address]  . MRSA by PCR 10/22/2018 NEGATIVE  NEGATIVE Final   Comment:        The GeneXpert MRSA Assay (FDA approved for NASAL specimens only), is one component of a comprehensive MRSA colonization surveillance program. It is not intended to diagnose MRSA infection nor to guide or monitor treatment for MRSA infections. Performed at [facility], [address] [place], [place] [address]   . WBC 10/24/2018 12.7* 4.0 - 10.5 K/uL Final  . RBC 10/24/2018 3.63* 3.87 - 5.11 MIL/uL Final  . Hemoglobin 10/24/2018 10.9* 12.0 - 15.0 g/dL Final  . HCT 10/24/2018 34.8* 36.0 - 46.0 % Final  . MCV 10/24/2018 95.9  80.0 - 100.0 fL Final  . MCH 10/24/2018 30.0  26.0 - 34.0 pg Final  . MCHC 10/24/2018 31.3  30.0 - 36.0 g/dL Final  . RDW 10/24/2018 13.3  11.5 - 15.5 % Final  . Platelets 10/24/2018 340  150 - 400 K/uL Final  . nRBC 10/24/2018 0.0  0.0 - 0.2 % Final   Performed at [facility], [address] [place], [place] [address]  . Sodium 10/24/2018 144  135 - 145 mmol/L Final  . Potassium 10/24/2018 3.8  3.5 - 5.1 mmol/L Final  . Chloride 10/24/2018 110  98 - 111 mmol/L Final  . CO2  10/24/2018  27  22 - 32 mmol/L Final  . Glucose, Bld 10/24/2018 91  70 - 99 mg/dL Final  . BUN 10/24/2018 14  8 - 23 mg/dL Final  . Creatinine, Ser 10/24/2018 0.60  0.44 - 1.00 mg/dL Final  . Calcium 10/24/2018 8.5* 8.9 - 10.3 mg/dL Final  . GFR calc non Af Amer 10/24/2018 >60  >60 mL/min Final  . GFR calc Af Amer 10/24/2018 >60  >60 mL/min Final  . Anion gap 10/24/2018 7  5 - 15 Final   Performed at [facility], [address], [place], [place] [address]   Assessment  The primary encounter diagnosis was Lumbar spondylosis. Diagnoses of Myofascial pain syndrome, cervical, Chronic sacroiliac joint pain (Right), Chronic pain syndrome, and Low magnesium levels were also pertinent to this visit.  Plan of Care  I am having [name] maintain her ALPRAZolam, buPROPion, escitalopram, levothyroxine, Ipratropium-Albuterol, traZODone, amLODipine, montelukast, ipratropium-albuterol, Fluticasone-Salmeterol, magnesium oxide, tizanidine, and oxyCODONE.  Pharmacotherapy (Medications Ordered): No orders of the defined types were placed in this encounter.  Orders:  No orders of the defined types were placed in this encounter.  Follow-up plan:   Return in about 3 months (around 06/18/2019) for MedMgmt.   I discussed the assessment and treatment plan with the patient. The patient was provided an opportunity to ask questions and all were answered. The patient agreed with the plan and demonstrated an understanding of the instructions.  Patient advised to call back or seek an in-person evaluation if the symptoms or condition worsens.  Total duration of non-face-to-face encounter: 15 minutes.  Note by: Alison David, NP Date: 03/19/2019; Time: 11:56 AM  Disclaimer:  * Given the special circumstances of the COVID-19 pandemic, the federal government has announced that the Office for Civil Rights (OCR) will exercise its enforcement discretion and will not impose penalties on physicians using  telehealth in the event of noncompliance with regulatory requirements under the [place] (HIPAA) in connection with the good faith provision of telehealth during the NGEXB-28 national public health emergency. ([place])

## 2019-03-19 NOTE — Patient Instructions (Signed)
____________________________________________________________________________________________  Medication Rules  Purpose: To inform patients, and their family members, of our rules and regulations.  Applies to: All patients receiving prescriptions (written or electronic).  Pharmacy of record: Pharmacy where electronic prescriptions will be sent. If written prescriptions are taken to a different pharmacy, please inform the nursing staff. The pharmacy listed in the electronic medical record should be the one where you would like electronic prescriptions to be sent.  Electronic prescriptions: In compliance with the [place] Strengthen Opioid Misuse Prevention (STOP) Act of 2017 (Session Law 2017-74/H243), effective November 20, 2018, all controlled substances must be electronically prescribed. Calling prescriptions to the pharmacy will cease to exist.  Prescription refills: Only during scheduled appointments. Applies to all prescriptions.  NOTE: The following applies primarily to controlled substances (Opioid* Pain Medications).   Patient's responsibilities: 1. Pain Pills: Bring all pain pills to every appointment (except for procedure appointments). 2. Pill Bottles: Bring pills in original pharmacy bottle. Always bring the newest bottle. Bring bottle, even if empty. 3. Medication refills: You are responsible for knowing and keeping track of what medications you take and those you need refilled. The day before your appointment: write a list of all prescriptions that need to be refilled. The day of the appointment: give the list to the admitting nurse. Prescriptions will be written only during appointments. No prescriptions will be written on procedure days. If you forget a medication: it will not be "Called in", "Faxed", or "electronically sent". You will need to get another appointment to get these prescribed. No early refills. Do not call asking to have your prescription filled  early. 4. Prescription Accuracy: You are responsible for carefully inspecting your prescriptions before leaving our office. Have the discharge nurse carefully go over each prescription with you, before taking them home. Make sure that your name is accurately spelled, that your address is correct. Check the name and dose of your medication to make sure it is accurate. Check the number of pills, and the written instructions to make sure they are clear and accurate. Make sure that you are given enough medication to last until your next medication refill appointment. 5. Taking Medication: Take medication as prescribed. When it comes to controlled substances, taking less pills or less frequently than prescribed is permitted and encouraged. Never take more pills than instructed. Never take medication more frequently than prescribed.  6. Inform other Doctors: Always inform, all of your healthcare providers, of all the medications you take. 7. Pain Medication from other Providers: You are not allowed to accept any additional pain medication from any other Doctor or Healthcare provider. There are two exceptions to this rule. (see below) In the event that you require additional pain medication, you are responsible for notifying us, as stated below. 8. Medication Agreement: You are responsible for carefully reading and following our Medication Agreement. This must be signed before receiving any prescriptions from our practice. Safely store a copy of your signed Agreement. Violations to the Agreement will result in no further prescriptions. (Additional copies of our Medication Agreement are available upon request.) 9. Laws, Rules, & Regulations: All patients are expected to follow all Federal and State Laws, Statutes, Rules, & Regulations. Ignorance of the Laws does not constitute a valid excuse. The use of any illegal substances is prohibited. 10. Adopted CDC guidelines & recommendations: Target dosing levels will be  at or below 60 MME/day. Use of benzodiazepines** is not recommended.  Exceptions: There are only two exceptions to the rule of not   receiving pain medications from other Healthcare Providers. 1. Exception #1 (Emergencies): In the event of an emergency (i.e.: accident requiring emergency care), you are allowed to receive additional pain medication. However, you are responsible for: As soon as you are able, call our office [phone], at any time of the day or night, and leave a message stating your name, the date and nature of the emergency, and the name and dose of the medication prescribed. In the event that your call is answered by a member of our staff, make sure to document and save the date, time, and the name of the person that took your information.  2. Exception #2 (Planned Surgery): In the event that you are scheduled by another doctor or dentist to have any type of surgery or procedure, you are allowed (for a period no longer than 30 days), to receive additional pain medication, for the acute post-op pain. However, in this case, you are responsible for picking up a copy of our "Post-op Pain Management for Surgeons" handout, and giving it to your surgeon or dentist. This document is available at our office, and does not require an appointment to obtain it. Simply go to our office during business hours (Monday-Thursday from 8:00 AM to 4:00 PM) (Friday 8:00 AM to 12:00 Noon) or if you have a scheduled appointment with us, prior to your surgery, and ask for it by name. In addition, you will need to provide us with your name, name of your surgeon, type of surgery, and date of procedure or surgery.  *Opioid medications include: morphine, codeine, oxycodone, oxymorphone, hydrocodone, hydromorphone, meperidine, tramadol, tapentadol, buprenorphine, fentanyl, methadone. **Benzodiazepine medications include: diazepam (Valium), alprazolam (Xanax), clonazepam (Klonopine), lorazepam (Ativan), clorazepate  (Tranxene), chlordiazepoxide (Librium), estazolam (Prosom), oxazepam (Serax), temazepam (Restoril), triazolam (Halcion) (Last updated: 01/17/2018) ____________________________________________________________________________________________    

## 2019-03-23 DIAGNOSIS — J441 Chronic obstructive pulmonary disease with (acute) exacerbation: Secondary | ICD-10-CM | POA: Diagnosis not present

## 2019-03-29 DIAGNOSIS — R69 Illness, unspecified: Secondary | ICD-10-CM | POA: Diagnosis not present

## 2019-04-23 DIAGNOSIS — J441 Chronic obstructive pulmonary disease with (acute) exacerbation: Secondary | ICD-10-CM | POA: Diagnosis not present

## 2019-05-23 DIAGNOSIS — J441 Chronic obstructive pulmonary disease with (acute) exacerbation: Secondary | ICD-10-CM | POA: Diagnosis not present

## 2019-06-03 ENCOUNTER — Telehealth: Payer: Self-pay | Admitting: *Deleted

## 2019-06-03 ENCOUNTER — Encounter: Payer: Self-pay | Admitting: Pain Medicine

## 2019-06-03 NOTE — Progress Notes (Signed)
Pain Management Virtual Encounter Note - Virtual Visit via Telephone Telehealth (real-time audio visits between healthcare provider and patient).   Patient's Phone No. & Preferred Pharmacy:  [phone] (home); There is no such number on file (mobile).; (Preferred) [phone] No e-mail address on record  [place], [place] - [address] [place] [address] Phone: [phone] Fax: [phone]    Pre-screening note:  Our staff contacted [name] and offered her an "in person", "face-to-face" appointment versus a telephone encounter. She indicated preferring the telephone encounter, at this time.   Reason for Virtual Visit: COVID-19*  Social distancing based on CDC and AMA recommendations.   I contacted [place] on 06/04/2019 via telephone.      I clearly identified myself as Gaspar Cola, MD. I verified that I was speaking with the correct person using two identifiers (Name: [name], and date of birth: 01-27-1946).  Advanced Informed Consent I sought verbal advanced consent from [name] for virtual visit interactions. I informed [name] of possible security and privacy concerns, risks, and limitations associated with providing "not-in-person" medical evaluation and management services. I also informed [name] of the availability of "in-person" appointments. Finally, I informed her that there would be a charge for the virtual visit and that she could be  personally, fully or partially, financially responsible for it. [name] expressed understanding and agreed to proceed.   Historic Elements   [name] is a 73 y.o. year old, female patient evaluated today after her last encounter by our practice on 06/03/2019. [name]  has a past medical history of Anxiety, CAD (coronary artery disease), Carpal tunnel syndrome, Cataract, Complication of anesthesia, COPD (chronic obstructive pulmonary disease) ([place]), CRP  elevated (09/14/2015), Depression, Difficult intubation, Dyslipidemia, Dyspnea, Dysrhythmia, Elevated sedimentation rate (09/14/2015), Esophageal spasm, Gastrointestinal parasites, GERD (gastroesophageal reflux disease), Hiatal hernia, History of peptic ulcer disease, Hyperthyroidism, Hypothyroidism, Low magnesium levels (09/14/2015), Pelvic fracture ([place]) (2008), Reflux, Rotator cuff injury, and Stenosis, spinal, lumbar. She also  has a past surgical history that includes Cesarean section; Neck surgery; Cholecystectomy; Carpal tunnel release; Rotator cuff repair; Shoulder arthroscopy (12/07/2011); Shoulder surgery (12/07/2011); Intramedullary (im) nail intertrochanteric (Right, 10/01/2016); Fracture surgery; Appendectomy; Nose surgery; Back surgery; Cataract extraction w/PHACO (Right, 08/07/2017); Hip surgery; Cataract extraction w/PHACO (Left, 09/04/2017); and Kyphoplasty (N/A, 08/20/2018). [name] has a current medication list which includes the following prescription(s): alprazolam, amlodipine, bupropion, escitalopram, fluticasone-salmeterol, ipratropium-albuterol, ipratropium-albuterol, levothyroxine, magnesium oxide, montelukast, oxycodone, oxycodone, oxycodone, tizanidine, and trazodone. She  reports that she has been smoking cigarettes. She has been smoking about 0.50 packs per day. She has never used smokeless tobacco. She reports that she does not drink alcohol or use drugs. [name] has No Known Allergies.   HPI  Today, she is being contacted for medication management.  The patient indicates doing very well on her current medication regimen without any side effects or adverse reactions.  She still having some shoulder pain but when she was asked if she wanted to have opposed to some injections to help her pain, she indicated that for the time being she did not feel that she needed any.  Pharmacotherapy Assessment  Analgesic: Oxycodone IR 5 mg, 1 tab PO q 6 hrs (20 mg/day of  oxycodone) MME/day:30mg /day.   Monitoring: Pharmacotherapy: No side-effects or adverse reactions reported. [place] PMP: PDMP reviewed during this encounter.       Compliance: No problems identified. Effectiveness: Clinically acceptable. Plan: Refer  to "POC".  Pertinent Labs   SAFETY SCREENING Profile Lab Results  Component Value Date   STAPHAUREUS NEGATIVE 10/01/2016   MRSAPCR NEGATIVE 10/22/2018   Renal Function Lab Results  Component Value Date   BUN 14 10/24/2018   CREATININE 0.60 10/24/2018   BCR 21 06/05/2017   GFRAA >60 10/24/2018   GFRNONAA >60 10/24/2018   Hepatic Function Lab Results  Component Value Date   AST 25 10/21/2018   ALT 20 10/21/2018   ALBUMIN 4.0 10/21/2018   UDS Summary  Date Value Ref Range Status  09/30/2018 FINAL  Final    Comment:    ==================================================================== TOXASSURE SELECT 13 (MW) ==================================================================== Test                             Result       Flag       Units Drug Present and Declared for Prescription Verification   Alprazolam                     219          EXPECTED   ng/mg creat   Alpha-hydroxyalprazolam        1298         EXPECTED   ng/mg creat    Source of alprazolam is a scheduled prescription medication.    Alpha-hydroxyalprazolam is an expected metabolite of alprazolam.   Oxycodone                      50           EXPECTED   ng/mg creat   Oxymorphone                    63           EXPECTED   ng/mg creat    Sources of oxycodone include scheduled prescription medications.    Oxymorphone is an expected metabolite of oxycodone. Oxymorphone    is also available as a scheduled prescription medication. Drug Absent but Declared for Prescription Verification   Hydrocodone                    Not Detected UNEXPECTED ng/mg creat ==================================================================== Test                      Result    Flag    Units      Ref Range   Creatinine              127              mg/dL      >=20 ==================================================================== Declared Medications:  The flagging and interpretation on this report are based on the  following declared medications.  Unexpected results may arise from  inaccuracies in the declared medications.  **Note: The testing scope of this panel includes these medications:  Alprazolam  Hydrocodone (Norco)  Oxycodone  **Note: The testing scope of this panel does not include following  reported medications:  Acetaminophen (Norco)  Albuterol (Combivent)  Amlodipine  Bupropion  Docusate (Colace)  Escitalopram (Lexapro)  Fluticasone (Advair)  Ipratropium (Combivent)  Levothyroxine  Magnesium Oxide  Salmeterol (Advair)  Tizanidine  Trazodone  Vitamin D3 ==================================================================== For clinical consultation, please call [phone]. ====================================================================    Note: Above Lab results reviewed.  Recent imaging  CT Angio Chest PE W and/or Wo Contrast CLINICAL DATA:  Shortness of breath. Recent treatment for pneumonia.  EXAM: CT ANGIOGRAPHY CHEST WITH CONTRAST  TECHNIQUE: Multidetector CT imaging of the chest was performed using the standard protocol during bolus administration of intravenous contrast. Multiplanar CT image reconstructions and MIPs were obtained to evaluate the vascular anatomy.  CONTRAST:  38mL OMNIPAQUE IOHEXOL 350 MG/ML SOLN  COMPARISON:  Plain film earlier today.  FINDINGS: Cardiovascular: Mild cardiomegaly. Calcifications throughout the coronary arteries. Scattered aortic calcifications. No aneurysm. No filling defects in the pulmonary arteries to suggest pulmonary emboli.  Mediastinum/Nodes: Mildly enlarged right paratracheal node measuring 11 mm. Borderline right hilar lymph nodes. Right hilar lymph node has a short  axis diameter of 10 mm. Small hiatal hernia.  Lungs/Pleura: Moderate emphysema. Airspace opacity in the posterior right upper lobe could reflect early infiltrate or scarring. No effusions.  Upper Abdomen: Prior cholecystectomy. Intrahepatic and extrahepatic biliary ductal dilatation. The common bile duct measures up to 12 mm. Fullness of the adrenal glands bilaterally.  Musculoskeletal: Chest wall soft tissues are unremarkable. Mild compression through the the superior endplate of G29 and inferior endplate of T6.  Review of the MIP images confirms the above findings.  IMPRESSION: No evidence of pulmonary embolus.  Mildly prominent mediastinal and right hilar lymph nodes. These may be reactive. These could be followed with repeat chest CT with IV contrast in 6 months to ensure stability or resolution.  Airspace opacity in the posterior inferior right upper lobe could reflect scarring or early pneumonia.  Intrahepatic and extrahepatic biliary ductal dilatation. This may be related to patient's age and post cholecystectomy state. Recommend correlation with LFTs.  Aortic Atherosclerosis (ICD10-I70.0) and Emphysema (ICD10-J43.9).  Electronically Signed   By: Rolm Baptise M.D.   On: 10/21/2018 23:22 DG Chest 2 View CLINICAL DATA:  Productive cough  EXAM: CHEST - 2 VIEW  COMPARISON:  08/17/2018  FINDINGS: There is hyperinflation of the lungs compatible with COPD. Heart and mediastinal contours are within normal limits. No focal opacities or effusions. No acute bony abnormality.  IMPRESSION: COPD.  No active disease.  Electronically Signed   By: Rolm Baptise M.D.   On: 10/21/2018 21:16  Assessment  The primary encounter diagnosis was Chronic pain syndrome. Diagnoses of Chronic low back pain (Primary Area of Pain) (Right), Chronic neck pain (Secondary area of Pain) (Bilateral) (R>L), Low magnesium levels, Myofascial pain syndrome, cervical, Pharmacologic therapy,  Disorder of skeletal system, Problems influencing health status, CRP elevated, B12 deficiency, and Vitamin D insufficiency were also pertinent to this visit.  Plan of Care  I have discontinued Gregary Signs A. Boulos's oxyCODONE and oxyCODONE. I have also changed her oxyCODONE. Additionally, I am having her start on oxyCODONE and oxyCODONE. Lastly, I am having her maintain her ALPRAZolam, buPROPion, escitalopram, levothyroxine, Ipratropium-Albuterol, traZODone, amLODipine, montelukast, ipratropium-albuterol, Fluticasone-Salmeterol, magnesium oxide, and tizanidine.  Pharmacotherapy (Medications Ordered): Meds ordered this encounter  Medications  . magnesium oxide (MAG-OX) 400 MG tablet    Sig: Take 1 tablet (400 mg total) by mouth 2 (two) times daily.    Dispense:  180 tablet    Refill:  0    Fill one day early if pharmacy is closed on scheduled refill date. May substitute for generic if available.  . tizanidine (ZANAFLEX) 2 MG capsule    Sig: Take 1 capsule (2 mg total) by mouth 3 (three) times daily as needed for muscle spasms.    Dispense:  90 capsule    Refill:  2    Fill one day early if pharmacy is closed  on scheduled refill date. May substitute for generic if available.  Marland Kitchen oxyCODONE (OXY IR/ROXICODONE) 5 MG immediate release tablet    Sig: Take 1 tablet (5 mg total) by mouth every 6 (six) hours as needed for severe pain. Must last 30 days    Dispense:  120 tablet    Refill:  0    Chronic Pain: STOP Act (Not applicable) Fill 1 day early if closed on refill date. Do not fill until: 06/28/2019. To last until: 07/28/2019. Avoid benzodiazepines within 8 hours of opioids  . oxyCODONE (OXY IR/ROXICODONE) 5 MG immediate release tablet    Sig: Take 1 tablet (5 mg total) by mouth every 6 (six) hours as needed for severe pain. Must last 30 days    Dispense:  120 tablet    Refill:  0    Chronic Pain: STOP Act (Not applicable) Fill 1 day early if closed on refill date. Do not fill until: 07/28/2019. To last  until: 08/27/2019. Avoid benzodiazepines within 8 hours of opioids  . oxyCODONE (OXY IR/ROXICODONE) 5 MG immediate release tablet    Sig: Take 1 tablet (5 mg total) by mouth every 6 (six) hours as needed for severe pain. Must last 30 days    Dispense:  120 tablet    Refill:  0    Chronic Pain: STOP Act (Not applicable) Fill 1 day early if closed on refill date. Do not fill until: 08/27/2019. To last until: 09/26/2019. Avoid benzodiazepines within 8 hours of opioids   Orders:  Orders Placed This Encounter  Procedures  . ToxASSURE Select 13 (MW), Urine    Volume: 30 ml(s). Minimum 3 ml of urine is needed. Document temperature of fresh sample. Indications: Long term (current) use of opiate analgesic (Z79.891)  . Comp. Metabolic Panel (12)    With GFR. Indications: Chronic Pain Syndrome (G89.4) & Pharmacotherapy (Y69.485)    Order Specific Question:   Has the patient fasted?    Answer:   No    Order Specific Question:   CC Results    Answer:   PCP-NURSE [[id number]]  . Magnesium    Indication: Pharmacologic therapy (J00.938)    Order Specific Question:   CC Results    Answer:   PCP-NURSE [[id number]]  . Vitamin B12    Indication: Pharmacologic therapy (Z16.967).    Order Specific Question:   CC Results    Answer:   PCP-NURSE [[id number]]  . Sedimentation rate    Indication: Disorder of skeletal system (M89.9)    Order Specific Question:   CC Results    Answer:   PCP-NURSE [[id number]]  . 25-Hydroxyvitamin D Lcms D2+D3    Indication: Disorder of skeletal system (M89.9).    Order Specific Question:   CC Results    Answer:   PCP-NURSE [[id number]]  . C-reactive protein    Indication: Problems influencing health status (Z78.9)    Order Specific Question:   CC Results    Answer:   PCP-NURSE [[id number]]   Follow-up plan:   Return in about 16 weeks (around 09/24/2019) for (VV), E/M (MM).      Interventional management options: Considering:   Possible right sided sacroiliac joint RFA Possible right-sided  lumbar facet RFA Diagnostic bilateral cervical facetblock  Possible bilateral cervical facet RFA  Diagnostic right-sided CESI  Diagnostic bilateral intra-articular shoulderjoint injection  Diagnostic bilateral suprascapularnerve block  Possible bilateral suprascapular nerve RFA  Diagnostic left-sided L3-4 TFESI  Diagnostic L3-4 versus L4-5 LESI    Palliative PRN treatment(s):   Diagnostic  right-sidedlumbarfacet + right-sided sacroiliac joint block#2 Palliative right-sided sacroiliac joint block Palliative right-sided lumbar facet block    Recent Visits Date Type Provider Dept  03/19/19 Office Visit Vevelyn Francois, NP [facility]  Showing recent visits within past 90 days and meeting all other requirements   Today's Visits Date Type Provider Dept  06/04/19 Office Visit Milinda Pointer, MD [facility]  Showing today's visits and meeting all other requirements   Future Appointments No visits were found meeting these conditions.  Showing future appointments within next 90 days and meeting all other requirements   I discussed the assessment and treatment plan with the patient. The patient was provided an opportunity to ask questions and all were answered. The patient agreed with the plan and demonstrated an understanding of the instructions.  Patient advised to call back or seek an in-person evaluation if the symptoms or condition worsens.  Total duration of non-face-to-face encounter: 12 minutes.  Note by: Gaspar Cola, MD Date: 06/04/2019; Time: 12:42 PM  Note: This dictation was prepared with Dragon dictation. Any transcriptional errors that may result from this process are unintentional.  Disclaimer:  * Given the special circumstances of the COVID-19 pandemic, the federal government has announced that the Office for Civil Rights (OCR) will exercise its enforcement discretion and will not impose penalties on physicians using telehealth in  the event of noncompliance with regulatory requirements under the [place] and [place] (HIPAA) in connection with the good faith provision of telehealth during the IOMBT-59 national public health emergency. ([place])

## 2019-06-03 NOTE — Telephone Encounter (Signed)
Attempted to call for pre appointment assessment. Voicemail not set up on home number, no answer on mobile number.

## 2019-06-03 NOTE — Telephone Encounter (Signed)
Second attempt to call both numbers for pre appointment assessment. Unable to contact patient.

## 2019-06-04 ENCOUNTER — Ambulatory Visit: Payer: Medicare HMO | Attending: Pain Medicine | Admitting: Pain Medicine

## 2019-06-04 ENCOUNTER — Other Ambulatory Visit: Payer: Self-pay

## 2019-06-04 DIAGNOSIS — R790 Abnormal level of blood mineral: Secondary | ICD-10-CM

## 2019-06-04 DIAGNOSIS — Z789 Other specified health status: Secondary | ICD-10-CM | POA: Insufficient documentation

## 2019-06-04 DIAGNOSIS — E538 Deficiency of other specified B group vitamins: Secondary | ICD-10-CM | POA: Diagnosis not present

## 2019-06-04 DIAGNOSIS — M7918 Myalgia, other site: Secondary | ICD-10-CM

## 2019-06-04 DIAGNOSIS — M899 Disorder of bone, unspecified: Secondary | ICD-10-CM | POA: Diagnosis not present

## 2019-06-04 DIAGNOSIS — G894 Chronic pain syndrome: Secondary | ICD-10-CM | POA: Diagnosis not present

## 2019-06-04 DIAGNOSIS — R7982 Elevated C-reactive protein (CRP): Secondary | ICD-10-CM

## 2019-06-04 DIAGNOSIS — M545 Low back pain: Secondary | ICD-10-CM | POA: Diagnosis not present

## 2019-06-04 DIAGNOSIS — Z79899 Other long term (current) drug therapy: Secondary | ICD-10-CM | POA: Diagnosis not present

## 2019-06-04 DIAGNOSIS — E559 Vitamin D deficiency, unspecified: Secondary | ICD-10-CM

## 2019-06-04 DIAGNOSIS — G8929 Other chronic pain: Secondary | ICD-10-CM | POA: Diagnosis not present

## 2019-06-04 DIAGNOSIS — M542 Cervicalgia: Secondary | ICD-10-CM | POA: Diagnosis not present

## 2019-06-04 MED ORDER — OXYCODONE HCL 5 MG PO TABS: 5.0000 mg | ORAL_TABLET | Freq: Four times a day (QID) | ORAL | 0 refills | Status: DC | PRN

## 2019-06-04 MED ORDER — MAGNESIUM OXIDE 400 MG PO TABS: 400.0000 mg | ORAL_TABLET | Freq: Two times a day (BID) | ORAL | 0 refills | Status: DC

## 2019-06-04 MED ORDER — TIZANIDINE HCL 2 MG PO CAPS: 2.0000 mg | ORAL_CAPSULE | Freq: Three times a day (TID) | ORAL | 2 refills | Status: DC | PRN

## 2019-06-18 ENCOUNTER — Encounter: Payer: Medicare HMO | Admitting: Pain Medicine

## 2019-06-23 DIAGNOSIS — J441 Chronic obstructive pulmonary disease with (acute) exacerbation: Secondary | ICD-10-CM | POA: Diagnosis not present

## 2019-06-25 DIAGNOSIS — J449 Chronic obstructive pulmonary disease, unspecified: Secondary | ICD-10-CM | POA: Diagnosis not present

## 2019-06-25 DIAGNOSIS — E785 Hyperlipidemia, unspecified: Secondary | ICD-10-CM | POA: Diagnosis not present

## 2019-06-25 DIAGNOSIS — E039 Hypothyroidism, unspecified: Secondary | ICD-10-CM | POA: Diagnosis not present

## 2019-06-25 DIAGNOSIS — E038 Other specified hypothyroidism: Secondary | ICD-10-CM | POA: Diagnosis not present

## 2019-06-25 DIAGNOSIS — I10 Essential (primary) hypertension: Secondary | ICD-10-CM | POA: Diagnosis not present

## 2019-07-24 DIAGNOSIS — J441 Chronic obstructive pulmonary disease with (acute) exacerbation: Secondary | ICD-10-CM | POA: Diagnosis not present

## 2019-08-23 DIAGNOSIS — J441 Chronic obstructive pulmonary disease with (acute) exacerbation: Secondary | ICD-10-CM | POA: Diagnosis not present

## 2019-09-02 DIAGNOSIS — R030 Elevated blood-pressure reading, without diagnosis of hypertension: Secondary | ICD-10-CM | POA: Diagnosis not present

## 2019-09-23 ENCOUNTER — Encounter: Payer: Self-pay | Admitting: Pain Medicine

## 2019-09-23 DIAGNOSIS — J441 Chronic obstructive pulmonary disease with (acute) exacerbation: Secondary | ICD-10-CM | POA: Diagnosis not present

## 2019-09-24 ENCOUNTER — Ambulatory Visit: Payer: Medicare HMO | Attending: Pain Medicine | Admitting: Pain Medicine

## 2019-09-24 ENCOUNTER — Other Ambulatory Visit: Payer: Self-pay

## 2019-09-24 DIAGNOSIS — G894 Chronic pain syndrome: Secondary | ICD-10-CM

## 2019-09-24 DIAGNOSIS — M545 Low back pain, unspecified: Secondary | ICD-10-CM

## 2019-09-24 DIAGNOSIS — G8929 Other chronic pain: Secondary | ICD-10-CM

## 2019-09-24 DIAGNOSIS — M7918 Myalgia, other site: Secondary | ICD-10-CM

## 2019-09-24 DIAGNOSIS — R790 Abnormal level of blood mineral: Secondary | ICD-10-CM

## 2019-09-24 MED ORDER — MAGNESIUM OXIDE 400 MG PO TABS: 400.0000 mg | ORAL_TABLET | Freq: Two times a day (BID) | ORAL | 3 refills | Status: DC

## 2019-09-24 MED ORDER — OXYCODONE HCL 5 MG PO TABS: 5.0000 mg | ORAL_TABLET | Freq: Four times a day (QID) | ORAL | 0 refills | Status: DC | PRN

## 2019-09-24 MED ORDER — TIZANIDINE HCL 2 MG PO CAPS: 2.0000 mg | ORAL_CAPSULE | Freq: Three times a day (TID) | ORAL | 5 refills | Status: DC | PRN

## 2019-09-24 NOTE — Progress Notes (Signed)
Pain Management Virtual Encounter Note - Virtual Visit via Telephone Telehealth (real-time audio visits between healthcare provider and patient).   Patient's Phone No. & Preferred Pharmacy:  [phone] (home); There is no such number on file (mobile).; (Preferred) [phone] No e-mail address on record  [place], [place] - [place] [address] Phone: [phone] Fax: [phone]    Pre-screening note:  Our staff contacted [name] and offered her an "in person", "face-to-face" appointment versus a telephone encounter. She indicated preferring the telephone encounter, at this time.   Reason for Virtual Visit: COVID-19*  Social distancing based on CDC and AMA recommendations.   I contacted [place] on 09/24/2019 via telephone.      I clearly identified myself as Gaspar Cola, MD. I verified that I was speaking with the correct person using two identifiers (Name: [name], and date of birth: 07-Feb-1946).  Advanced Informed Consent I sought verbal advanced consent from [name] for virtual visit interactions. I informed [name] of possible security and privacy concerns, risks, and limitations associated with providing "not-in-person" medical evaluation and management services. I also informed [name] of the availability of "in-person" appointments. Finally, I informed her that there would be a charge for the virtual visit and that she could be  personally, fully or partially, financially responsible for it. [name] expressed understanding and agreed to proceed.   Historic Elements   [name] is a 73 y.o. year old, female patient evaluated today after her last encounter by our practice on 06/04/2019. [name]  has a past medical history of Anxiety, CAD (coronary artery disease), Carpal tunnel syndrome, Cataract, Complication of anesthesia, COPD (chronic obstructive pulmonary disease) ([place]), CRP  elevated (09/14/2015), Depression, Difficult intubation, Dyslipidemia, Dyspnea, Dysrhythmia, Elevated sedimentation rate (09/14/2015), Esophageal spasm, Gastrointestinal parasites, GERD (gastroesophageal reflux disease), Hiatal hernia, History of peptic ulcer disease, Hyperthyroidism, Hypothyroidism, Low magnesium levels (09/14/2015), Pelvic fracture ([place]) (2008), Reflux, Rotator cuff injury, and Stenosis, spinal, lumbar. She also  has a past surgical history that includes Cesarean section; Neck surgery; Cholecystectomy; Carpal tunnel release; Rotator cuff repair; Shoulder arthroscopy (12/07/2011); Shoulder surgery (12/07/2011); Intramedullary (im) nail intertrochanteric (Right, 10/01/2016); Fracture surgery; Appendectomy; Nose surgery; Back surgery; Cataract extraction w/PHACO (Right, 08/07/2017); Hip surgery; Cataract extraction w/PHACO (Left, 09/04/2017); and Kyphoplasty (N/A, 08/20/2018). [name] has a current medication list which includes the following prescription(s): alprazolam, amlodipine, bupropion, escitalopram, fluticasone-salmeterol, ipratropium-albuterol, ipratropium-albuterol, levothyroxine, magnesium oxide, montelukast, oxycodone, oxycodone, oxycodone, tizanidine, and trazodone. She  reports that she has been smoking cigarettes. She has been smoking about 0.50 packs per day. She has never used smokeless tobacco. She reports that she does not drink alcohol or use drugs. [name] has No Known Allergies.   HPI  Today, she is being contacted for medication management.  According to patient's PMP she did get some hydrocodone from Donato Schultz, DDS.  Pharmacotherapy Assessment  Analgesic: Oxycodone IR 5 mg, 1 tab PO q 6 hrs (20 mg/day of oxycodone) MME/day:30mg /day.   Monitoring: Pharmacotherapy: No side-effects or adverse reactions reported. [place] PMP: PDMP reviewed during this encounter.       Compliance: No problems identified. Effectiveness: Clinically acceptable. Plan: Refer to  "POC".  UDS:  Summary  Date Value Ref Range Status  09/30/2018 FINAL  Final    Comment:    ==================================================================== TOXASSURE SELECT 13 (MW) ==================================================================== Test  Result       Flag       Units Drug Present and Declared for Prescription Verification   Alprazolam                     219          EXPECTED   ng/mg creat   Alpha-hydroxyalprazolam        1298         EXPECTED   ng/mg creat    Source of alprazolam is a scheduled prescription medication.    Alpha-hydroxyalprazolam is an expected metabolite of alprazolam.   Oxycodone                      50           EXPECTED   ng/mg creat   Oxymorphone                    63           EXPECTED   ng/mg creat    Sources of oxycodone include scheduled prescription medications.    Oxymorphone is an expected metabolite of oxycodone. Oxymorphone    is also available as a scheduled prescription medication. Drug Absent but Declared for Prescription Verification   Hydrocodone                    Not Detected UNEXPECTED ng/mg creat ==================================================================== Test                      Result    Flag   Units      Ref Range   Creatinine              127              mg/dL      >=20 ==================================================================== Declared Medications:  The flagging and interpretation on this report are based on the  following declared medications.  Unexpected results may arise from  inaccuracies in the declared medications.  **Note: The testing scope of this panel includes these medications:  Alprazolam  Hydrocodone (Norco)  Oxycodone  **Note: The testing scope of this panel does not include following  reported medications:  Acetaminophen (Norco)  Albuterol (Combivent)  Amlodipine  Bupropion  Docusate (Colace)  Escitalopram (Lexapro)  Fluticasone (Advair)   Ipratropium (Combivent)  Levothyroxine  Magnesium Oxide  Salmeterol (Advair)  Tizanidine  Trazodone  Vitamin D3 ==================================================================== For clinical consultation, please call [phone]. ====================================================================    Laboratory Chemistry Profile (12 mo)  Renal: 10/24/2018: BUN 14; Creatinine, Ser 0.60  Lab Results  Component Value Date   GFRAA >60 10/24/2018   GFRNONAA >60 10/24/2018   Hepatic: 10/21/2018: Albumin 4.0 Lab Results  Component Value Date   AST 25 10/21/2018   ALT 20 10/21/2018   Other: No results found for requested labs within last 8760 hours. Note: Above Lab results reviewed.  Imaging  Last 90 days:  No results found.  Assessment  The primary encounter diagnosis was Chronic pain syndrome. Diagnoses of Chronic low back pain (Primary Area of Pain) (Right), Chronic neck pain (Secondary area of Pain) (Bilateral) (R>L), Low magnesium levels, and Myofascial pain syndrome, cervical were also pertinent to this visit.  Plan of Care  I am having Gregary Signs A. Deming start on oxyCODONE and oxyCODONE. I am also having her maintain her ALPRAZolam, buPROPion, escitalopram, levothyroxine, Ipratropium-Albuterol, traZODone, amLODipine, montelukast, ipratropium-albuterol, Fluticasone-Salmeterol, magnesium oxide, tizanidine, and  oxyCODONE.  Pharmacotherapy (Medications Ordered): Meds ordered this encounter  Medications  . magnesium oxide (MAG-OX) 400 MG tablet    Sig: Take 1 tablet (400 mg total) by mouth 2 (two) times daily.    Dispense:  180 tablet    Refill:  3    Fill one day early if pharmacy is closed on scheduled refill date. May substitute for generic if available.  . tizanidine (ZANAFLEX) 2 MG capsule    Sig: Take 1 capsule (2 mg total) by mouth 3 (three) times daily as needed for muscle spasms.    Dispense:  90 capsule    Refill:  5    Fill one day early if pharmacy is closed on  scheduled refill date. May substitute for generic if available.  Marland Kitchen oxyCODONE (OXY IR/ROXICODONE) 5 MG immediate release tablet    Sig: Take 1 tablet (5 mg total) by mouth every 6 (six) hours as needed for severe pain. Must last 30 days    Dispense:  120 tablet    Refill:  0    Chronic Pain: STOP Act (Not applicable) Fill 1 day early if closed on refill date. Do not fill until: 09/26/2019. To last until: 10/26/2019. Avoid benzodiazepines within 8 hours of opioids  . oxyCODONE (OXY IR/ROXICODONE) 5 MG immediate release tablet    Sig: Take 1 tablet (5 mg total) by mouth every 6 (six) hours as needed for severe pain. Must last 30 days    Dispense:  120 tablet    Refill:  0    Chronic Pain: STOP Act (Not applicable) Fill 1 day early if closed on refill date. Do not fill until: 10/26/2019. To last until: 11/25/2019. Avoid benzodiazepines within 8 hours of opioids  . oxyCODONE (OXY IR/ROXICODONE) 5 MG immediate release tablet    Sig: Take 1 tablet (5 mg total) by mouth every 6 (six) hours as needed for severe pain. Must last 30 days    Dispense:  120 tablet    Refill:  0    Chronic Pain: STOP Act (Not applicable) Fill 1 day early if closed on refill date. Do not fill until: 11/25/2019. To last until: 12/25/2019. Avoid benzodiazepines within 8 hours of opioids   Orders:  No orders of the defined types were placed in this encounter.  Follow-up plan:   Return in about 3 months (around 12/22/2019) for (VV), (MM).      Interventional management options: Considering:   Possible right SI joint RFA Possible right lumbar facet RFA Diagnostic bilateral cervical facetblock  Possible bilateral cervical facet RFA  Diagnostic right CESI  Diagnostic bilateral IA shoulder injection  Diagnostic bilateral suprascapularNB  Possible bilateral suprascapular nerve RFA  Diagnostic left L3-4 TFESI  Diagnostic L3-4 vs L4-5 LESI    Palliative PRN treatment(s):   Diagnostic rightlumbarfacet + right SI joint  block#2 Palliative right SI joint block Palliative right lumbar facet block    Recent Visits No visits were found meeting these conditions.  Showing recent visits within past 90 days and meeting all other requirements   Today's Visits Date Type Provider Dept  09/24/19 Telemedicine Milinda Pointer, MD [facility]  Showing today's visits and meeting all other requirements   Future Appointments No visits were found meeting these conditions.  Showing future appointments within next 90 days and meeting all other requirements   I discussed the assessment and treatment plan with the patient. The patient was provided an opportunity to ask questions and all were answered. The patient agreed with  the plan and demonstrated an understanding of the instructions.  Patient advised to call back or seek an in-person evaluation if the symptoms or condition worsens.  Total duration of non-face-to-face encounter: 15 minutes.  Note by: Gaspar Cola, MD Date: 09/24/2019; Time: 2:17 PM  Note: This dictation was prepared with Dragon dictation. Any transcriptional errors that may result from this process are unintentional.  Disclaimer:  * Given the special circumstances of the COVID-19 pandemic, the federal government has announced that the Office for Civil Rights (OCR) will exercise its enforcement discretion and will not impose penalties on physicians using telehealth in the event of noncompliance with regulatory requirements under the [place] (HIPAA) in connection with the good faith provision of telehealth during the XX123456 national public health emergency. ([place])

## 2019-10-06 DIAGNOSIS — R790 Abnormal level of blood mineral: Secondary | ICD-10-CM | POA: Diagnosis not present

## 2019-10-06 DIAGNOSIS — E538 Deficiency of other specified B group vitamins: Secondary | ICD-10-CM | POA: Diagnosis not present

## 2019-10-06 DIAGNOSIS — E559 Vitamin D deficiency, unspecified: Secondary | ICD-10-CM | POA: Diagnosis not present

## 2019-10-06 DIAGNOSIS — R7982 Elevated C-reactive protein (CRP): Secondary | ICD-10-CM | POA: Diagnosis not present

## 2019-10-06 DIAGNOSIS — M899 Disorder of bone, unspecified: Secondary | ICD-10-CM | POA: Diagnosis not present

## 2019-10-06 DIAGNOSIS — G894 Chronic pain syndrome: Secondary | ICD-10-CM | POA: Diagnosis not present

## 2019-10-06 DIAGNOSIS — Z79899 Other long term (current) drug therapy: Secondary | ICD-10-CM | POA: Diagnosis not present

## 2019-10-06 DIAGNOSIS — Z789 Other specified health status: Secondary | ICD-10-CM | POA: Diagnosis not present

## 2019-10-09 LAB — TOXASSURE SELECT 13 (MW), URINE

## 2019-10-11 LAB — COMP. METABOLIC PANEL (12)
AST: 16 IU/L (ref 0–40)
Albumin/Globulin Ratio: 1.5 (ref 1.2–2.2)
Albumin: 3.7 g/dL (ref 3.7–4.7)
Alkaline Phosphatase: 123 IU/L — ABNORMAL HIGH (ref 39–117)
BUN/Creatinine Ratio: 9 — ABNORMAL LOW (ref 12–28)
BUN: 7 mg/dL — ABNORMAL LOW (ref 8–27)
Bilirubin Total: <0.2 mg/dL (ref 0.0–1.2)
Calcium: 9.2 mg/dL (ref 8.7–10.3)
Chloride: 102 mmol/L (ref 96–106)
Creatinine, Ser: 0.81 mg/dL (ref 0.57–1.00)
GFR calc Af Amer: 84 mL/min/{1.73_m2} (ref >59)
GFR calc non Af Amer: 73 mL/min/{1.73_m2} (ref >59)
Globulin, Total: 2.5 g/dL (ref 1.5–4.5)
Glucose: 96 mg/dL (ref 65–99)
Potassium: 4.2 mmol/L (ref 3.5–5.2)
Sodium: 143 mmol/L (ref 134–144)
Total Protein: 6.2 g/dL (ref 6.0–8.5)

## 2019-10-11 LAB — 25-HYDROXY VITAMIN D LCMS D2+D3
25-Hydroxy, Vitamin D-2: <1 ng/mL
25-Hydroxy, Vitamin D-3: 14 ng/mL
25-Hydroxy, Vitamin D: 15 ng/mL — ABNORMAL LOW

## 2019-10-11 LAB — MAGNESIUM: Magnesium: 1.5 mg/dL — ABNORMAL LOW (ref 1.6–2.3)

## 2019-10-11 LAB — C-REACTIVE PROTEIN: CRP: 5 mg/L (ref 0–10)

## 2019-10-11 LAB — SEDIMENTATION RATE: Sed Rate: 15 mm/hr (ref 0–40)

## 2019-10-11 LAB — VITAMIN B12: Vitamin B-12: 331 pg/mL (ref 232–1245)

## 2019-10-23 DIAGNOSIS — J441 Chronic obstructive pulmonary disease with (acute) exacerbation: Secondary | ICD-10-CM | POA: Diagnosis not present

## 2019-11-12 ENCOUNTER — Other Ambulatory Visit: Payer: Self-pay | Admitting: Pain Medicine

## 2019-11-12 DIAGNOSIS — E559 Vitamin D deficiency, unspecified: Secondary | ICD-10-CM

## 2019-11-12 MED ORDER — CALCIUM PLUS D3 ABSORBABLE 600-2500 MG-UNIT PO CAPS: 1.0000 | ORAL_CAPSULE | Freq: Two times a day (BID) | ORAL | 5 refills | Status: DC

## 2019-11-12 MED ORDER — ERGOCALCIFEROL 1.25 MG (50000 UT) PO CAPS: 50000.0000 [IU] | ORAL_CAPSULE | ORAL | 1 refills | Status: DC

## 2019-11-12 MED ORDER — MAGNESIUM 500 MG PO CAPS: ORAL_CAPSULE | ORAL | 5 refills | Status: DC

## 2019-11-12 NOTE — Progress Notes (Signed)
Test: Vitamin D levels Finding(s): Low  Normal Level(s): between 30 and 100 ng/mL. Vitamin D Insufficiency: Levels between 20-30 ng/ml are defined as a "Vitamin D insufficiency". Vitamin D Deficiency: Levels below 20 ng/ml, is diagnosed as a "Vitamin D Deficiency". Clinical significance:  Low 25-hydroxyvitamin D: A low blood level of 25-hydroxyvitamin D may mean that a person is not getting enough exposure to sunlight or enough dietary vitamin D to meet his or her body's demand or that there is a problem with its absorption from the intestines. Occasionally, drugs used to treat seizures, particularly phenytoin (Dilantin), can interfere with the production of 25-hydroxyvitamin D in the liver. There is some evidence that vitamin D deficiency may increase the risk of some cancers, immune diseases, and cardiovascular disease. Low 1,25-dihydroxyvitamin D: A low level of 1,25-dihydroxyvitamin D can be seen in kidney disease and is one of the earliest changes to occur in persons with early kidney failure. Signs and symptoms may include: Vitamin D deficiencies and insufficiencies may be associated with fatigue, weakness, bone pain, joint pain, and muscle pain. Associated complications may include: hypocalcemia, hypophosphatemia, and reduced bone density. Possible causes:  - Most common: dietary insufficiency; inadequate sun exposure; inability to absorb vitamin D from the intestines; or inability to process it due to kidney or liver disease. Patient Recommendation(s): Patient may benefit from taking over-the-counter Vitamin D3 supplements. I recommend a vitamin D + Calcium supplements. "Natures Bounty", a brand easily found in most pharmacies, has a formulation containing Calcium 1200 mg plus Vitamin D3 1000 IU, in Softgels capsules that are easy to swallow. This should be taken once a day, preferably in the morning as vitamin D will increase energy levels and make it difficult to fall asleep, if taken at night.  Patients with levels lower than 20 ng/ml should contact their primary care physicians to receive replacement therapy. Vitamin D3 can be obtained over-the-counter, without a prescription. Vitamin D2 requires a prescription and it is used for replacement therapy. ___________________________________________________________________________________  

## 2019-11-12 NOTE — Progress Notes (Signed)
-   Low BUN levels are not common and are not usually a cause for concern. They may be seen in severe liver disease, malnutrition, and sometimes when a person is overhydrated (too much fluid volume), but the BUN test is not usually used to diagnose or monitor these conditions. ___________________________________________________________________________________   - BUN-to-creatinine ratio >20:1 (BUN dispropertionally higher than the creatinine levels) suggests prerenal azotemia (dehydration or renal hypoperfusion), while <10:1 levels suggest renal damage. __________________________________________________________________________________   - Normal ALP (Alkaline phosphatase) levels are between 35 -105 IU/L, for our Lab. High ALP could suggest liver damage or increased bone cell activity. If other tests such as bilirubin, aspartate aminotransferase (AST), or alanine aminotransferase (ALT) are also high, usually the increased ALP is coming from the liver. Higher-than-normal ALP levels can be seen with: biliary obstruction; bone conditions; osteoblastic bone tumors; osteomalacia; a healing fracture; liver disease; hepatitis; eating a fatty meal if you have blood type O or B; hyperparathyroidism; leukemia; lymphoma; Paget disease; rickets; and/or sarcoidosis. ___________________________________________________________________________________

## 2019-11-12 NOTE — Progress Notes (Signed)
Normal Magnesium level(s): between 1.7 and 2.4 mEq/L. Low Magnesium Level(s): Levels below 1.7 mEq/L. (Known as "hypomagnesemia") Clinical significance: Low magnesium blood level can lead to low calcium and potassium levels. Low levels may indicate inadequate dietary consuming, poor absorbtion, or excessive excretion. It can lead to low potasium  Signs and symptoms may include: Deficiency of magnesium can cause tiredness, generalized weakness, muscle cramps, abnormal heart rhythms, increased irritability of the nervous system with tremors, paresthesias, palpitations, hypokalemia, hypoparathyroidism which might result in hypocalcemia, chondrocalcinosis, spasticity and tetany, epileptic seizures, basal ganglia calcifications and in extreme and prolonged cases coma, intellectual disability or death.[3] Other symptoms that have been suggested to be associated with hypomagnesemia are athetosis, jerking, nystagmus, and an extensor plantar reflex, confusion, disorientation, hallucinations, depression, hypertension and fast heart rate. Hospitalized patients being treated on an intensive care unit who have a low magnesium level may have a higher risk of respiratory failure, and death. Possible causes: Causes include alcoholism, starvation, diarrhea, increased urinary loss, and poor absorption from the intestines. - Medications: Loop and thiazide diuretics. Certain antibiotics. Heartburn medicines such as omeprazole. Etc. - Medical: Genetic mutations. Gastrointestinal diseases. Malabsorption, acute pancreatitis, fluoride poisoning, etc.  Recommendations: - Contact primary care physician for further evaluation and recommendations. - Consider taking over-the-counter supplements. Consult your primary care physician first. 

## 2019-12-02 ENCOUNTER — Encounter: Payer: Self-pay | Admitting: Emergency Medicine

## 2019-12-02 ENCOUNTER — Emergency Department: Payer: Medicare HMO

## 2019-12-02 ENCOUNTER — Emergency Department
Admission: EM | Admit: 2019-12-02 | Discharge: 2019-12-03 | Disposition: A | Discharge status: Home / Self Care | Payer: Medicare HMO | Attending: Emergency Medicine | Admitting: Emergency Medicine

## 2019-12-02 ENCOUNTER — Other Ambulatory Visit: Payer: Self-pay

## 2019-12-02 DIAGNOSIS — M79674 Pain in right toe(s): Secondary | ICD-10-CM | POA: Diagnosis present

## 2019-12-02 DIAGNOSIS — I2510 Atherosclerotic heart disease of native coronary artery without angina pectoris: Secondary | ICD-10-CM | POA: Diagnosis not present

## 2019-12-02 DIAGNOSIS — F17210 Nicotine dependence, cigarettes, uncomplicated: Secondary | ICD-10-CM | POA: Diagnosis not present

## 2019-12-02 DIAGNOSIS — J449 Chronic obstructive pulmonary disease, unspecified: Secondary | ICD-10-CM | POA: Insufficient documentation

## 2019-12-02 DIAGNOSIS — Z79899 Other long term (current) drug therapy: Secondary | ICD-10-CM | POA: Insufficient documentation

## 2019-12-02 DIAGNOSIS — E039 Hypothyroidism, unspecified: Secondary | ICD-10-CM | POA: Diagnosis not present

## 2019-12-02 DIAGNOSIS — I739 Peripheral vascular disease, unspecified: Secondary | ICD-10-CM

## 2019-12-02 DIAGNOSIS — I96 Gangrene, not elsewhere classified: Secondary | ICD-10-CM

## 2019-12-02 LAB — CBC WITH DIFFERENTIAL/PLATELET
Abs Immature Granulocytes: 0.03 10*3/uL (ref 0.00–0.07)
Basophils Absolute: 0.1 10*3/uL (ref 0.0–0.1)
Basophils Relative: 1 %
Eosinophils Absolute: 0.1 10*3/uL (ref 0.0–0.5)
Eosinophils Relative: 1 %
HCT: 37.2 % (ref 36.0–46.0)
Hemoglobin: 11.7 g/dL — ABNORMAL LOW (ref 12.0–15.0)
Immature Granulocytes: 0 %
Lymphocytes Relative: 15 %
Lymphs Abs: 1.5 10*3/uL (ref 0.7–4.0)
MCH: 28 pg (ref 26.0–34.0)
MCHC: 31.5 g/dL (ref 30.0–36.0)
MCV: 89 fL (ref 80.0–100.0)
Monocytes Absolute: 0.6 10*3/uL (ref 0.1–1.0)
Monocytes Relative: 6 %
Neutro Abs: 7.5 10*3/uL (ref 1.7–7.7)
Neutrophils Relative %: 77 %
Platelets: 309 10*3/uL (ref 150–400)
RBC: 4.18 MIL/uL (ref 3.87–5.11)
RDW: 13.5 % (ref 11.5–15.5)
WBC: 9.8 10*3/uL (ref 4.0–10.5)
nRBC: 0 % (ref 0.0–0.2)

## 2019-12-02 LAB — LACTIC ACID, PLASMA: Lactic Acid, Venous: 1.2 mmol/L (ref 0.5–1.9)

## 2019-12-02 LAB — COMPREHENSIVE METABOLIC PANEL
ALT: 57 U/L — ABNORMAL HIGH (ref 0–44)
AST: 21 U/L (ref 15–41)
Albumin: 3.2 g/dL — ABNORMAL LOW (ref 3.5–5.0)
Alkaline Phosphatase: 148 U/L — ABNORMAL HIGH (ref 38–126)
Anion gap: 10 (ref 5–15)
BUN: 11 mg/dL (ref 8–23)
CO2: 28 mmol/L (ref 22–32)
Calcium: 8.7 mg/dL — ABNORMAL LOW (ref 8.9–10.3)
Chloride: 100 mmol/L (ref 98–111)
Creatinine, Ser: 0.87 mg/dL (ref 0.44–1.00)
GFR calc Af Amer: >60 mL/min (ref >60)
GFR calc non Af Amer: >60 mL/min (ref >60)
Glucose, Bld: 151 mg/dL — ABNORMAL HIGH (ref 70–99)
Potassium: 3.3 mmol/L — ABNORMAL LOW (ref 3.5–5.1)
Sodium: 138 mmol/L (ref 135–145)
Total Bilirubin: 0.8 mg/dL (ref 0.3–1.2)
Total Protein: 6.5 g/dL (ref 6.5–8.1)

## 2019-12-02 MED ORDER — IOHEXOL 350 MG/ML SOLN
125.0000 mL | Freq: Once | INTRAVENOUS | Status: AC | PRN
  Administered 2019-12-02: 23:00:00 125 mL via INTRAVENOUS
  Filled 2019-12-02: qty 125

## 2019-12-02 NOTE — ED Notes (Signed)
Patient is in treatment room, patient resting in bed comfortably and watching TV. Patient is hard of hearing. Patients 2nd toe of right foot is purple in color with single red line following vessel working up top of foot toward shin. Pulses have been marked and verified with doppler.

## 2019-12-02 NOTE — ED Provider Notes (Signed)
[facility] Emergency Department Provider Note  ____________________________________________  Time seen: Approximately 10:03 PM  I have reviewed the triage vital signs and the nursing notes.   HISTORY  Chief Complaint Toe Pain    HPI [name] is a 74 y.o. female who presents the emergency department complaining of middle toe pain to the right foot.  According to the patient "several months ago" she dropped a food item, reportedly a canned good on her foot.  Patient states that she has had pain since, but it has been worsening recently.  Patient reports that her home care provider looked at her foot today, noticed that one of her toes was "black" and had red streak to her foot.  EMS was called and patient presented to emergency department for evaluation.  She denies any fevers or chills, nasal congestion, sore throat.  She has a history of anxiety, coronary artery disease, COPD, depression, GERD, peptic ulcer disease, hypothyroidism,  lumbar stenosis, chronic pain.  Other than her toe, no other complaints at this time.  No medications for this complaint prior to arrival.        Past Medical History:  Diagnosis Date  . Anxiety   . CAD (coronary artery disease)   . Carpal tunnel syndrome   . Cataract   . Complication of anesthesia    has woken up at times  . COPD (chronic obstructive pulmonary disease) ([place])   . CRP elevated 09/14/2015  . Depression   . Difficult intubation   . Dyslipidemia   . Dyspnea    DOE  . Dysrhythmia    hx palpatations  . Elevated sedimentation rate 09/14/2015  . Esophageal spasm   . Gastrointestinal parasites   . GERD (gastroesophageal reflux disease)   . Hiatal hernia   . History of peptic ulcer disease   . Hyperthyroidism   . Hypothyroidism   . Low magnesium levels 09/14/2015  . Pelvic fracture ([place]) 2008   fall from riding a horse  . Reflux   . Rotator cuff injury   . Stenosis, spinal, lumbar     Patient  Active Problem List   Diagnosis Date Noted  . Vitamin D deficiency 11/12/2019  . Pharmacologic therapy 06/04/2019  . Disorder of skeletal system 06/04/2019  . Problems influencing health status 06/04/2019  . Sepsis ([place]) 10/22/2018  . HCAP (healthcare-associated pneumonia) 10/22/2018  . Compression fracture of L3 vertebra ([place]) 08/19/2018  . Acute UTI 08/18/2018  . Chronic pain of both hips 12/31/2017  . Neurogenic pain 08/29/2017  . Chronic low back pain (Primary Area of Pain) (Right) 08/29/2017  . Chronic sacroiliac joint pain (Right) 06/05/2017  . Vitamin D insufficiency 03/12/2017  . Right hip pain 02/20/2017  . Intertrochanteric fracture of right hip, sequela 02/20/2017  . B12 deficiency 02/05/2017  . Pressure injury of skin 10/02/2016  . Closed displaced intertrochanteric fracture of right femur ([place]) 10/02/2016  . Hip fracture ([place]) 10/01/2016  . Cervical facet hypertrophy (Bilateral) 05/27/2016  . History of shoulder surgery 5 (Right) 05/27/2016  . Lumbar foraminal stenosis (L3-4) (Left) 05/27/2016  . 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 Lumbar facet hypertrophy (Bilateral) 05/27/2016  . Lumbar facet syndrome (Right) 05/27/2016  . Lumbar grade 1 Anterolisthesis of L3 over L4 05/27/2016  . Chronic shoulder pain (Bilateral) (status post multiple surgeries) (R>L) 12/08/2015  . Substance use disorder Risk: High 09/14/2015  . Chronic pain syndrome 09/14/2015  . Cervical spondylosis 09/14/2015  . Chronic neck pain (Secondary  area of Pain) (Bilateral) (R>L) 09/14/2015  . 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 Cervical facet syndrome (Bilateral) (R>L) 09/14/2015  . Cervical myofascial pain syndrome 09/14/2015  . Lumbar spondylosis 09/14/2015  . Chronic shoulder impingement syndrome (Right) 09/14/2015  . Hypomagnesemia 09/14/2015  . CRP elevated 09/14/2015  . Elevated sedimentation rate 09/14/2015  .  Chronic obstructive pulmonary disease (COPD) ([place]) 09/14/2015  . Nicotine dependence 09/14/2015  . Chronic shoulder pain (Right) 09/14/2015  . Abnormal nerve conduction studies 09/14/2015  . Encounter for therapeutic drug level monitoring 09/09/2015  . Long term current use of opiate analgesic 09/09/2015  . Long term prescription opiate use 09/09/2015  . Uncomplicated opioid dependence ([place]) 09/09/2015  . Opiate use 09/09/2015  . Anxiety and depression 07/11/2012  . Smoking 05/27/2011  . Fatigue 05/27/2011  . Hypothyroidism 11/25/2010  . HLD (hyperlipidemia) 08/24/2010  . Tachycardia 08/24/2010  . DYSPNEA 08/24/2010    Past Surgical History:  Procedure Laterality Date  . APPENDECTOMY    . BACK SURGERY     CERVICAL FUSION  . CARPAL TUNNEL RELEASE    . CATARACT EXTRACTION W/PHACO Right 08/07/2017   Procedure: CATARACT EXTRACTION PHACO AND INTRAOCULAR LENS PLACEMENT (IOC);  Surgeon: Birder Robson, MD;  Location: [facility] ORS;  Service: Ophthalmology;  Laterality: Right;  [place] 00:52.0 AP% 16.8 CDE 8.74 Fluid Pack Lot # [id number]  . CATARACT EXTRACTION W/PHACO Left 09/04/2017   Procedure: CATARACT EXTRACTION PHACO AND INTRAOCULAR LENS PLACEMENT (IOC);  Surgeon: Birder Robson, MD;  Location: [facility] ORS;  Service: Ophthalmology;  Laterality: Left;  [place] 00:34 AP% 17.0 CDE 5.80 Fluid pack lot # [id number]  . CESAREAN SECTION    . CHOLECYSTECTOMY    . FRACTURE SURGERY    . HIP SURGERY    . INTRAMEDULLARY (IM) NAIL INTERTROCHANTERIC Right 10/01/2016   Procedure: INTRAMEDULLARY (IM) NAIL INTERTROCHANTRIC;  Surgeon: Corky Mull, MD;  Location: [facility] ORS;  Service: Orthopedics;  Laterality: Right;  . KYPHOPLASTY N/A 08/20/2018   Procedure: [id number];  Surgeon: Hessie Knows, MD;  Location: [facility] ORS;  Service: Orthopedics;  Laterality: N/A;  . NECK SURGERY    . NOSE SURGERY    . ROTATOR CUFF REPAIR     x2  . SHOULDER ARTHROSCOPY  12/07/2011   Procedure: ARTHROSCOPY SHOULDER;  Surgeon:  Ninetta Lights, MD;  Location: [place];  Service: Orthopedics;  Laterality: Right;  Debridement Partial Cuff Tear, Release Coracoacromial Ligament  . SHOULDER SURGERY  12/07/2011   right    Prior to Admission medications   Medication Sig Start Date End Date Taking? Authorizing Provider  ALPRAZolam Duanne Moron) 1 MG tablet Take 1 tablet (1 mg total) by mouth 4 (four) times daily as needed. Patient taking differently: Take 1 mg by mouth 2 (two) times daily as needed.  10/04/16   Epifanio Lesches, MD  amLODipine (NORVASC) 5 MG tablet Take 1 tablet (5 mg total) by mouth daily. 08/22/18   Gladstone Lighter, MD  buPROPion (WELLBUTRIN SR) 150 MG 12 hr tablet Take 150 mg by mouth daily.     [provider]  Calcium Carb-Cholecalciferol (CALCIUM PLUS D3 ABSORBABLE) [phone] MG-UNIT CAPS Take 1 capsule by mouth 2 (two) times daily with a meal. 11/12/19 05/10/20  Milinda Pointer, MD  ergocalciferol (VITAMIN D2) 1.25 MG (50000 UT) capsule Take 1 capsule (50,000 Units total) by mouth 2 (two) times a week. X 6 weeks. 11/13/19 02/05/20  Milinda Pointer, MD  escitalopram (LEXAPRO) 10 MG tablet Take 10 mg  by mouth daily.  10/15/13   [provider]  Fluticasone-Salmeterol (ADVAIR DISKUS) 250-50 MCG/DOSE AEPB Inhale 1 puff into the lungs 2 (two) times daily. 10/24/18 10/24/19  Dustin Flock, MD  Ipratropium-Albuterol (COMBIVENT RESPIMAT) 20-100 MCG/ACT AERS respimat Inhale 1 puff into the lungs every 6 (six) hours as needed for wheezing or shortness of breath.    [provider]  ipratropium-albuterol (DUONEB) 0.5-2.5 (3) MG/3ML SOLN Take 3 mLs by nebulization every 4 (four) hours as needed. 10/24/18   Dustin Flock, MD  levothyroxine (SYNTHROID, LEVOTHROID) 88 MCG tablet Take 88 mcg by mouth daily before breakfast.     [provider]  Magnesium 500 MG CAPS Take 1 capsule (500 mg total) by mouth daily with breakfast AND 1 capsule (500 mg total) at bedtime.  11/12/19 05/10/20  Milinda Pointer, MD  magnesium oxide (MAG-OX) 400 MG tablet Take 1 tablet (400 mg total) by mouth 2 (two) times daily. 09/26/19 09/25/20  Milinda Pointer, MD  montelukast (SINGULAIR) 10 MG tablet Take 10 mg by mouth daily.    [provider]  oxyCODONE (OXY IR/ROXICODONE) 5 MG immediate release tablet Take 1 tablet (5 mg total) by mouth every 6 (six) hours as needed for severe pain. Must last 30 days 09/26/19 10/26/19  Milinda Pointer, MD  oxyCODONE (OXY IR/ROXICODONE) 5 MG immediate release tablet Take 1 tablet (5 mg total) by mouth every 6 (six) hours as needed for severe pain. Must last 30 days 10/26/19 11/25/19  Milinda Pointer, MD  oxyCODONE (OXY IR/ROXICODONE) 5 MG immediate release tablet Take 1 tablet (5 mg total) by mouth every 6 (six) hours as needed for severe pain. Must last 30 days 11/25/19 12/25/19  Milinda Pointer, MD  tizanidine (ZANAFLEX) 2 MG capsule Take 1 capsule (2 mg total) by mouth 3 (three) times daily as needed for muscle spasms. 09/26/19 03/24/20  Milinda Pointer, MD  traZODone (DESYREL) 100 MG tablet Take 1 tablet (100 mg total) by mouth at bedtime. Patient taking differently: Take 200 mg by mouth at bedtime.  08/21/18   Gladstone Lighter, MD    Allergies Patient has no known allergies.  Family History  Problem Relation Age of Onset  . Aneurysm Other     Social History Social History   Tobacco Use  . Smoking status: Current Every Day Smoker    Packs/day: 0.50    Types: Cigarettes  . Smokeless tobacco: Never Used  . Tobacco comment: down to 2 cigs per day  Substance Use Topics  . Alcohol use: No  . Drug use: No     Review of Systems  Constitutional: No fever/chills Eyes: No visual changes. No discharge ENT: No upper respiratory complaints. Cardiovascular: no chest pain. Respiratory: no cough. No SOB. Gastrointestinal: No abdominal pain.  No nausea, no vomiting.  No diarrhea.  No constipation. Genitourinary: Negative for  dysuria. No hematuria Musculoskeletal: Positive for pain to the middle toe of the right foot, duskiness of the toe, red streak to the foot. Skin: Negative for rash, abrasions, lacerations, ecchymosis. Neurological: Negative for headaches, focal weakness or numbness. 10-point ROS otherwise negative.  ____________________________________________   PHYSICAL EXAM:  VITAL SIGNS: ED Triage Vitals  Enc Vitals Group     BP      Pulse      Resp      Temp      Temp src      SpO2      Weight      Height      Head  Circumference      Peak Flow      Pain Score      Pain Loc      Pain Edu?      Excl. in [place]?      Constitutional: Alert and oriented. Well appearing and in no acute distress. Eyes: Conjunctivae are normal. PERRL. EOMI. Head: Atraumatic. ENT:      Ears:       Nose: No congestion/rhinnorhea.      Mouth/Throat: Mucous membranes are moist.  Neck: No stridor.    Cardiovascular: Normal rate, regular rhythm. Normal S1 and S2.  Good peripheral circulation. Respiratory: Normal respiratory effort without tachypnea or retractions. Lungs CTAB. Good air entry to the bases with no decreased or absent breath sounds. Musculoskeletal: Full range of motion to all extremities. No gross deformities appreciated.  Visualization of the right lower extremity reveals no deformity, gross edema.  Visualization of the right foot reveals a dusky toe.  No capillary refill is appreciated at this time.  Proximal to the toe along the dorsal aspect there is a red streak extending along the metatarsal.  No gross edema in this region.  Patient does not have sensation with light touch but strong pinching does elicit tenderness over the digit.  Sensation is normal to the surrounding digits.  Capillary refill is intact of the surrounding digits.  Palpation does not elicit good dorsalis pedis pulse, however this is found using Doppler ultrasound at bedside and pulses are marked bilaterally.  Patient has a good femoral  pulse. Neurologic:  Normal speech and language. No gross focal neurologic deficits are appreciated.  Skin:  Skin is warm, dry and intact. No rash noted. Psychiatric: Mood and affect are normal. Speech and behavior are normal. Patient exhibits appropriate insight and judgement.   ____________________________________________   LABS (all labs ordered are listed, but only abnormal results are displayed)  Labs Reviewed  COMPREHENSIVE METABOLIC PANEL - Abnormal; Notable for the following components:      Result Value   Potassium 3.3 (*)    Glucose, Bld 151 (*)    Calcium 8.7 (*)    Albumin 3.2 (*)    ALT 57 (*)    Alkaline Phosphatase 148 (*)    All other components within normal limits  CBC WITH DIFFERENTIAL/PLATELET - Abnormal; Notable for the following components:   Hemoglobin 11.7 (*)    All other components within normal limits  CULTURE, BLOOD (ROUTINE X 2)  CULTURE, BLOOD (ROUTINE X 2)  LACTIC ACID, PLASMA  LACTIC ACID, PLASMA   ____________________________________________  EKG   ____________________________________________  RADIOLOGY I personally viewed and evaluated these images as part of my medical decision making, as well as reviewing the written report by the radiologist.  DG Foot Complete Right  Result Date: 12/02/2019 CLINICAL DATA:  Necrotic middle toe EXAM: RIGHT FOOT COMPLETE - 3+ VIEW COMPARISON:  None. FINDINGS: No acute bony abnormality. Specifically, no fracture, subluxation, or dislocation. No bone destruction to suggest osteomyelitis. Soft tissues are intact. IMPRESSION: No acute bony abnormality. Electronically Signed   By: Rolm Baptise M.D.   On: 12/02/2019 23:50    ____________________________________________    PROCEDURES  Procedure(s) performed:    Procedures    Medications  iohexol (OMNIPAQUE) 350 MG/ML injection 125 mL (125 mLs Intravenous Contrast Given 12/02/19 2320)     ____________________________________________   INITIAL  IMPRESSION / ASSESSMENT AND PLAN / ED COURSE  Pertinent labs & imaging results that were available during my care of the patient  were reviewed by me and considered in my medical decision making (see chart for details).  Review of the [place] CSRS was performed in accordance of the [place] prior to dispensing any controlled drugs.           Patient presented to emergency department complaining of pain, darkness of the middle toe right foot.  Reportedly patient dropped an item on her toe several months ago.  Since then she has had worsening pain to the toe.  It was evaluated by home health care today and patient was sent to the emergency department as her toe was dark, with a red streak proximal to the toe.  Findings are consistent with decreased circulation to the middle digit.  Distal aspect appears to be turning necrotic.  There is a erythematous streak extending from the proximal toe.  No warmth to the area.  No appreciable edema.  Labs are reassuring at this time with no elevated white blood cell count, nonelevated lactic.  X-ray reveals no evidence of osteomyelitis.  Given these findings I did order angio runoff to ensure no arterial occlusion of the right lower extremity.  These results have not returned at this time.  This section emergency department is closing and patient care will be transferred to the major side of the emergency department with attending provider, Dr. Karma Greaser.  Final diagnosis and disposition will be provided by attending provider.     This chart was dictated using voice recognition software/Dragon. Despite best efforts to proofread, errors can occur which can change the meaning. Any change was purely unintentional.    Darletta Moll, PA-C 12/02/19 2359    Hinda Kehr, MD 12/03/19 [phone]

## 2019-12-02 NOTE — ED Triage Notes (Signed)
Pt presents to ED with c/o pain and discoloration to the third toe on her right foot. Pt states her foot has been bothering her for "a while". Family member removed sock today and noticed toe was black with red streak coming from toe up foot and ankle. Pt reports tenderness to affected toe. Denies fever.

## 2019-12-02 NOTE — ED Provider Notes (Signed)
-----------------------------------------   11:46 PM on 12/02/2019 -----------------------------------------  Assuming care from [facility]  In short, [name] is a 74 y.o. female with a chief complaint of toe pain.  Refer to the original H&P for additional details.  The current plan of care is to follow up on CT angio to assess possible vascular emergency as well as the possibility of osteomyelitis.  Patient goes to Dr. Vickki Muff for podiatry, may need to discuss with him.   ----------------------------------------- 12:49 AM on 12/03/2019 -----------------------------------------  No osteomyelitis on x-rays.  CTA shows extensive chronic disease but no obvious emergent findings.  I discussed the case by phone with Dr. Lucky Cowboy with vascular surgery and read to him the results.  He agreed that the patient does not require admission or urgent/emergent surgery but does need close follow-up with vascular surgery and podiatry.  I sent a message through [facility] both Dr. Vickki Muff and Dr. Lucky Cowboy make them aware of the situation and to help assist with follow-up.  I updated the patient about the plan and she understands and agrees.  I gave her a small dose of morphine 2 mg IV prior to discharge and stressed to her that she needs to take her regular pain medicine prescribed by Dr. Dossie Arbour.  I gave my usual and customary return precautions.   Hinda Kehr, MD 12/03/19 0100

## 2019-12-03 IMAGING — CR DG CHEST 2V
1 series · 2 of 2 positions shown · non-contrast
Comparison: 10/01/2016 chest radiograph.

CLINICAL DATA: Dyspnea

EXAM:
CHEST - 2 VIEW

[Series 1: dg chest 2 view · 0.14mm/px · 2 of 2 slices shown]
[im 1/2]
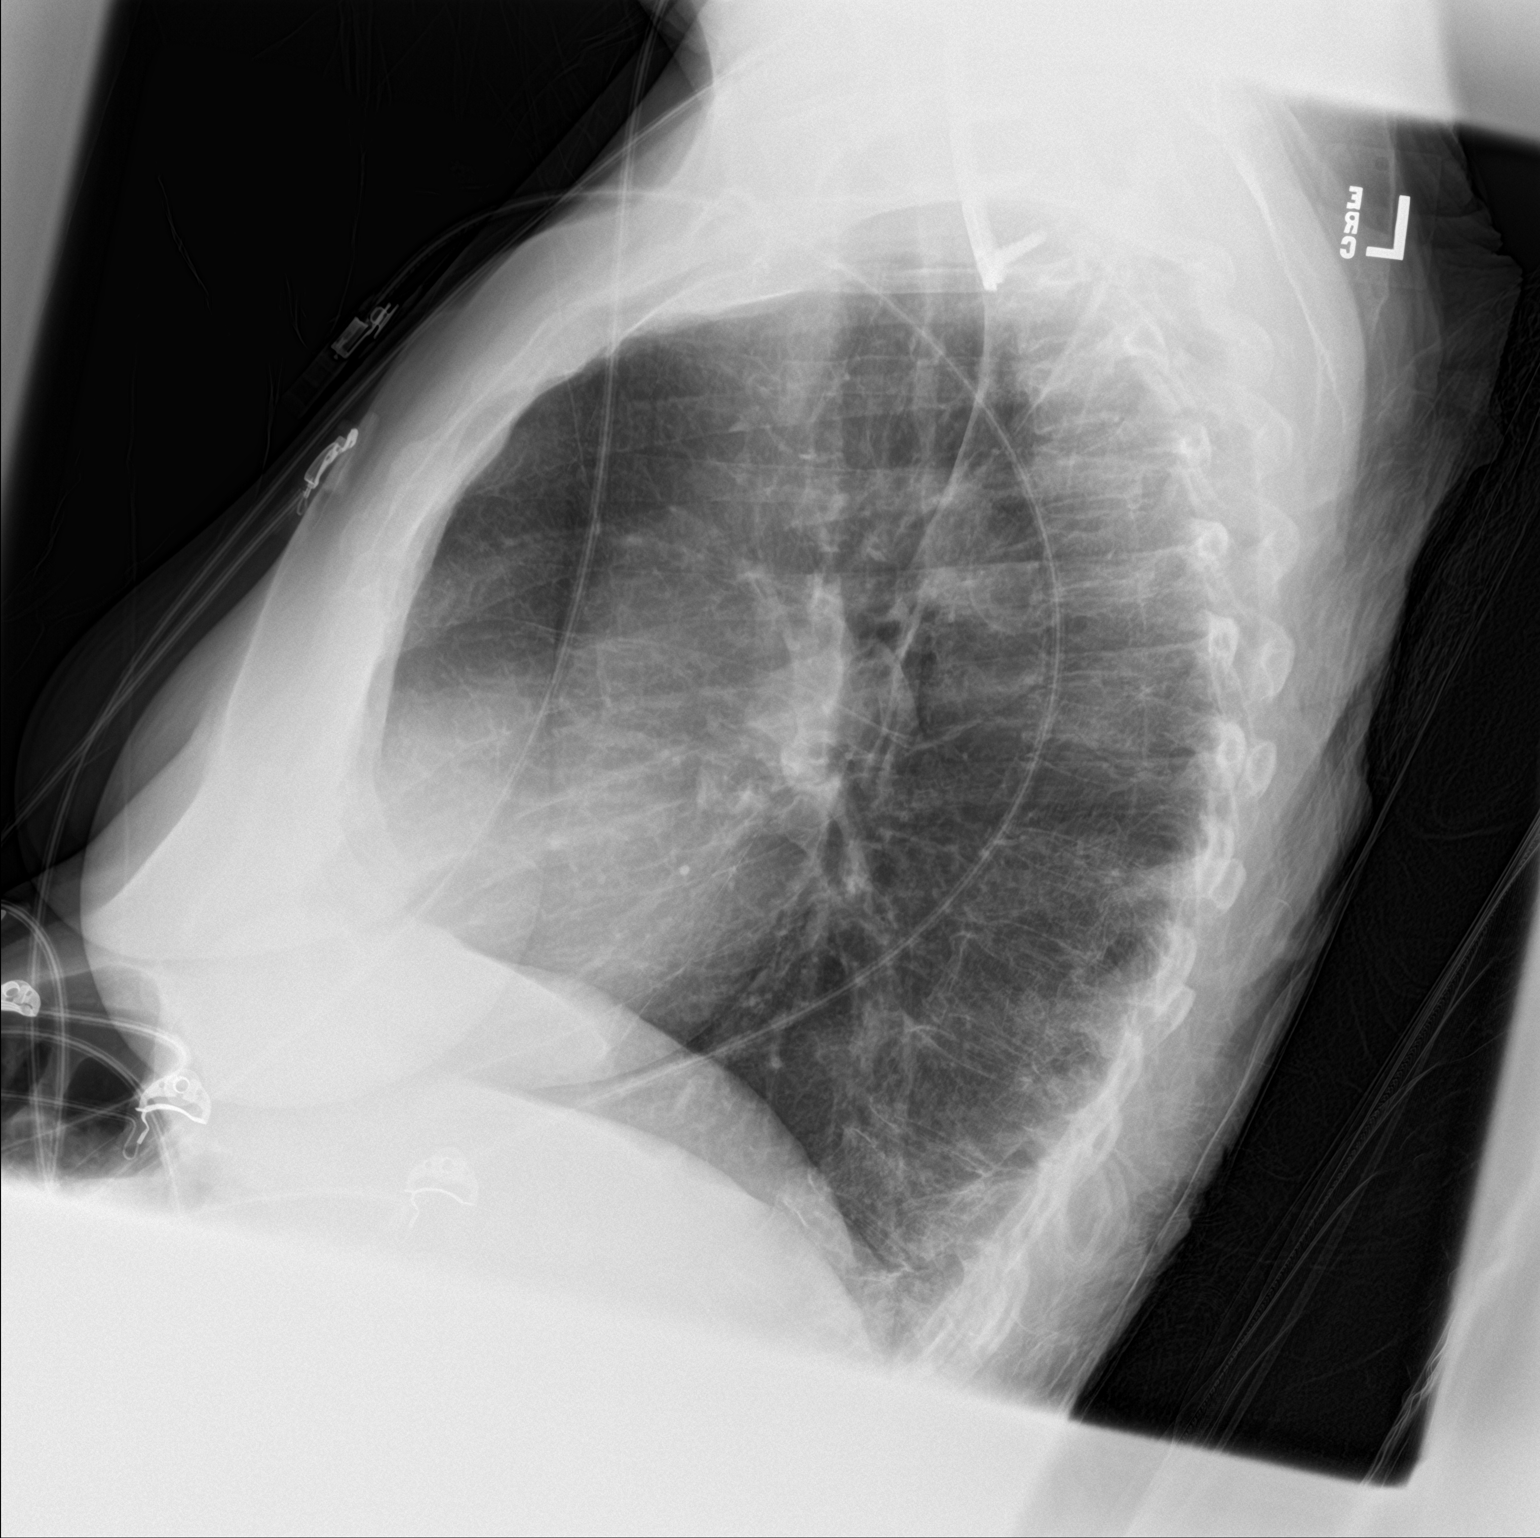
[im 2/2]
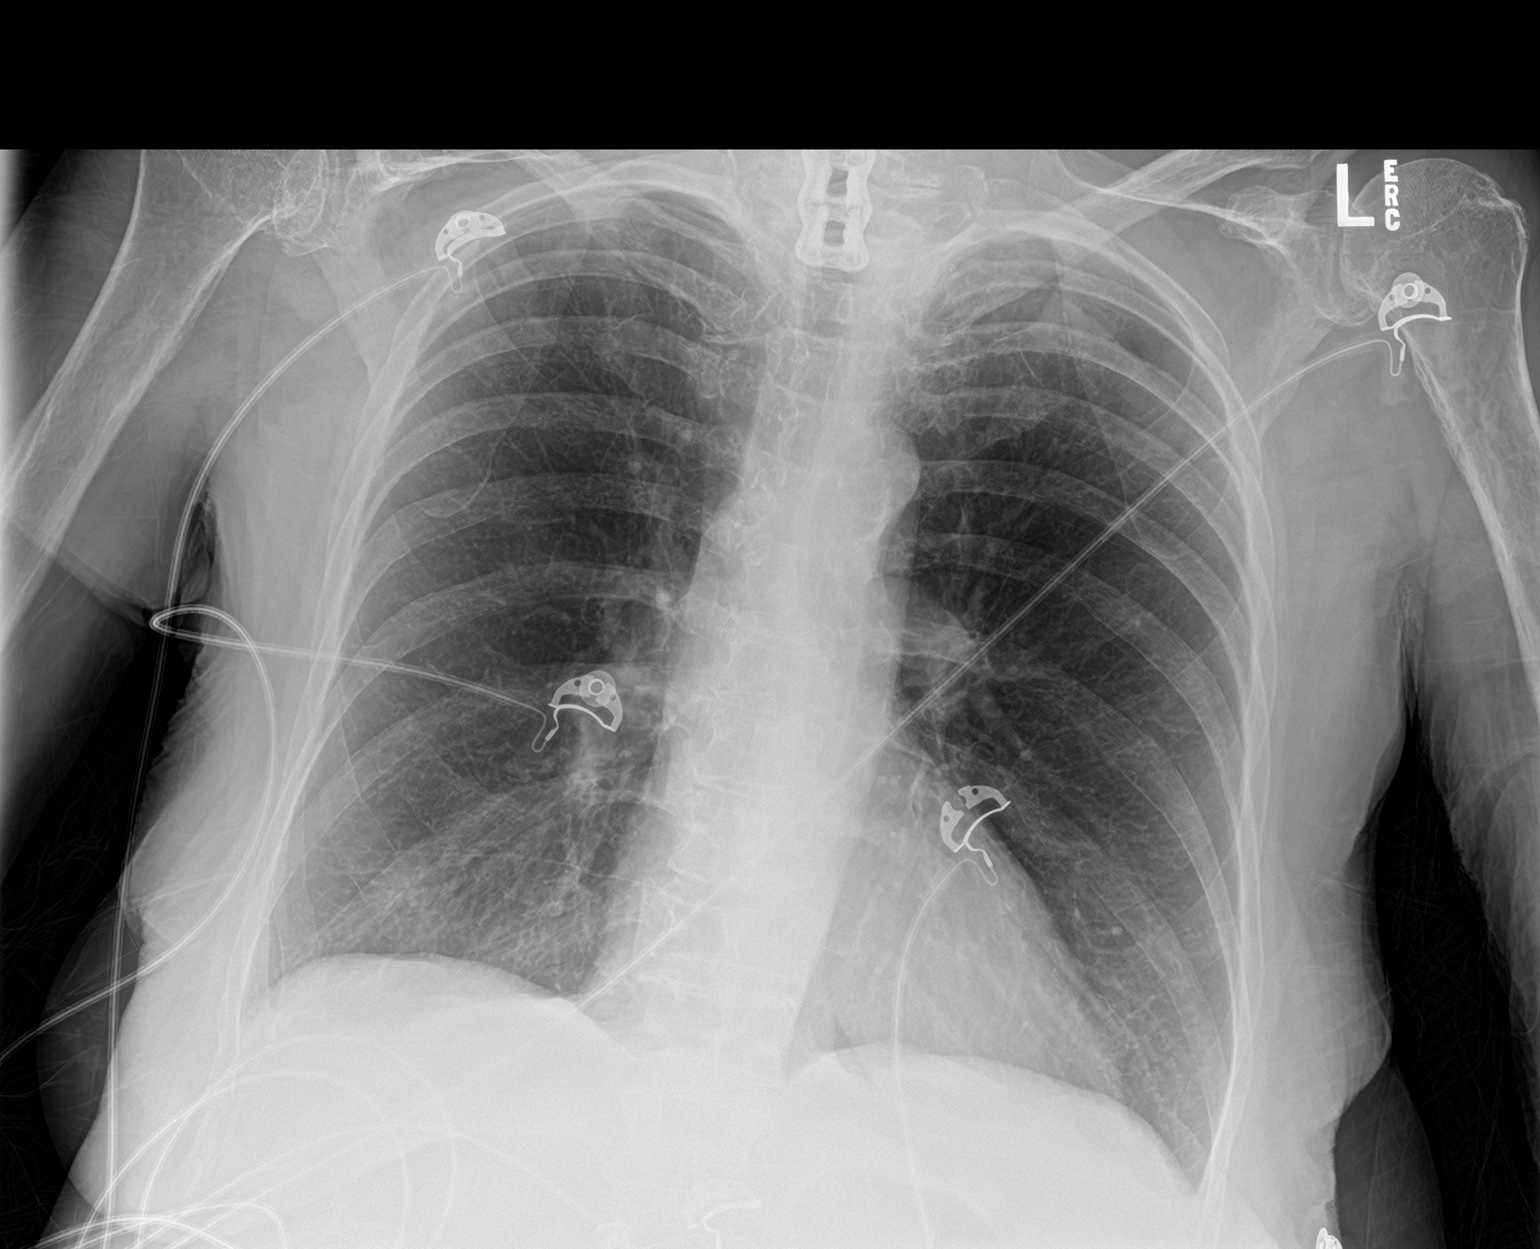

[2 of 2 positions shown; findings below may reference images not displayed]

FINDINGS: Surgical hardware from ACDF overlies the lower cervical spine.
Stable cardiomediastinal silhouette with normal heart size. No
pneumothorax. No pleural effusion. Lungs appear clear, with no acute
consolidative airspace disease and no pulmonary edema.
IMPRESSION: No active cardiopulmonary disease.

## 2019-12-03 IMAGING — CT CT HEAD W/O CM
3 of 4 series · 16 of 47 positions shown, 19 images · non-contrast
Comparison: CT scan of October 01, 2016.

CLINICAL DATA: For head injury after multiple falls.

EXAM:
CT HEAD WITHOUT CONTRAST
TECHNIQUE: Contiguous axial images were obtained from the base of the skull
through the vertex without intravenous contrast.

[Series 2: head wo · axial · 0.42mm/px · z∈[-94,+26]mm · 10 of 30 slices shown, 13 images]
[im 3/30  brain]
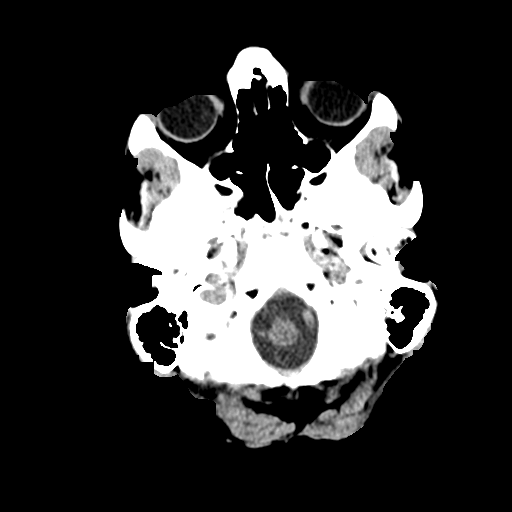
[im 3/30  bone]
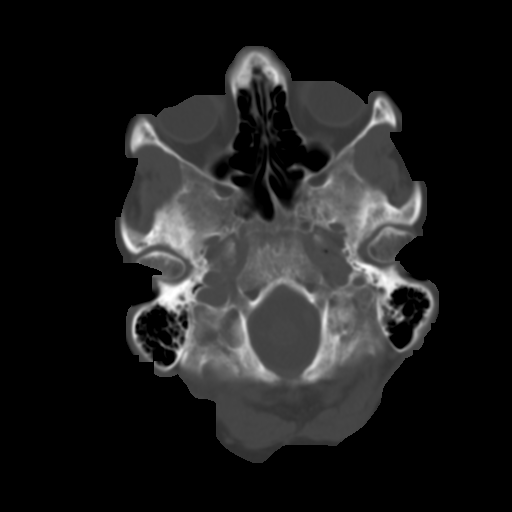
[im 5/30  brain]
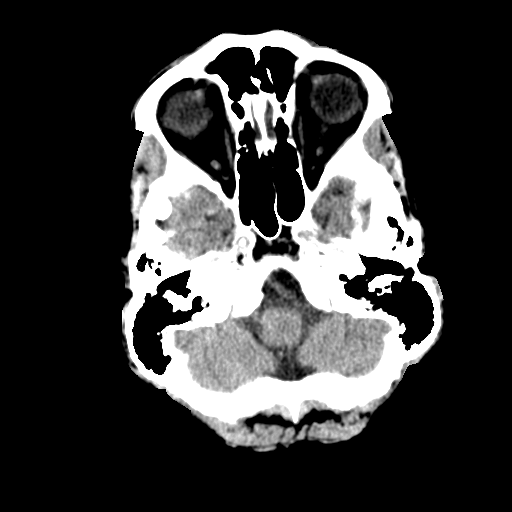
[im 9/30  brain]
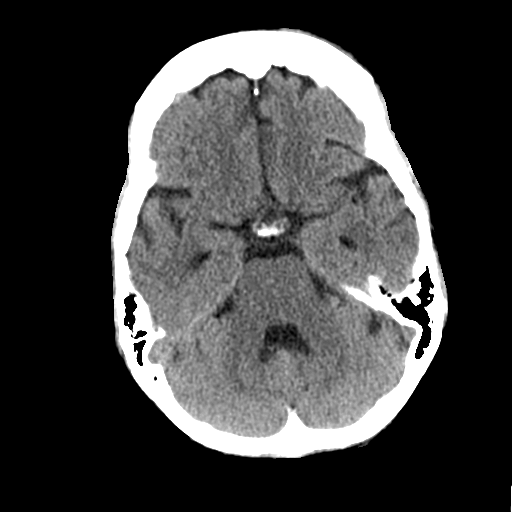
[im 11/30  brain]
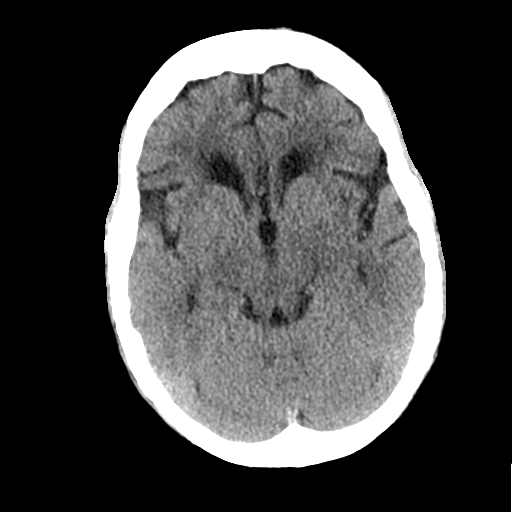
[im 13/30  brain]
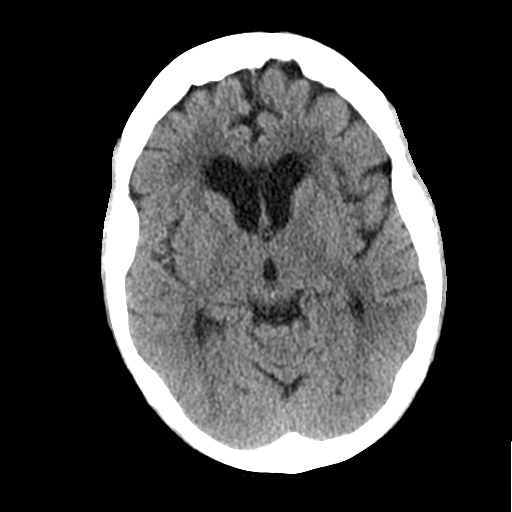
[im 13/30  bone]
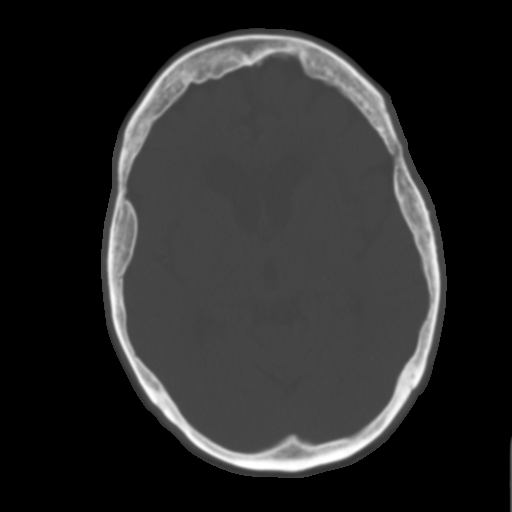
[im 17/30  brain]
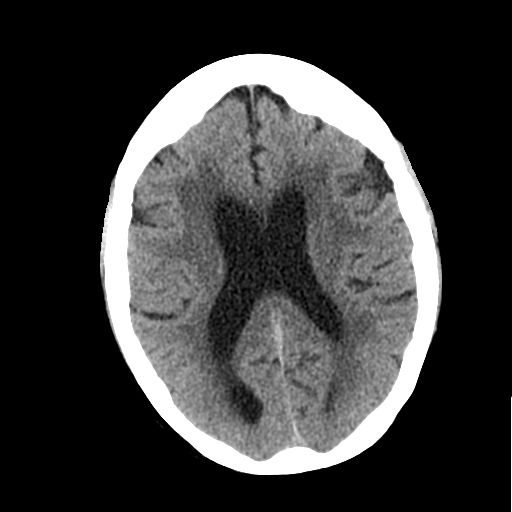
[im 19/30  brain]
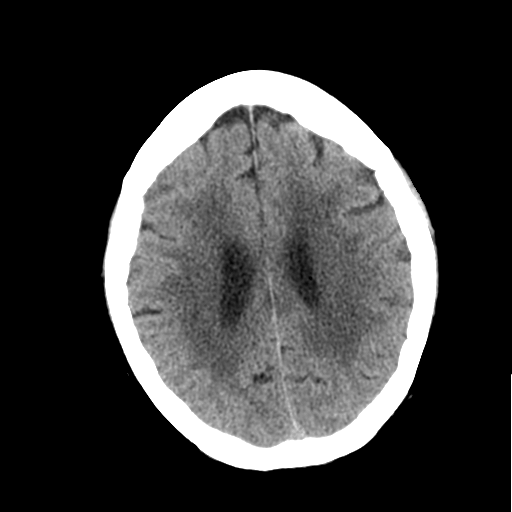
[im 21/30  brain]
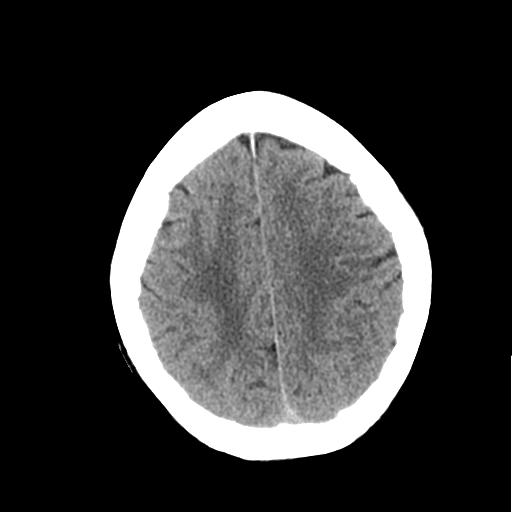
[im 25/30  brain]
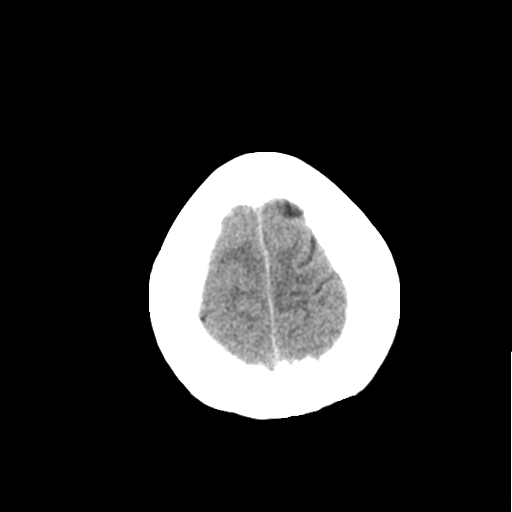
[im 25/30  bone]
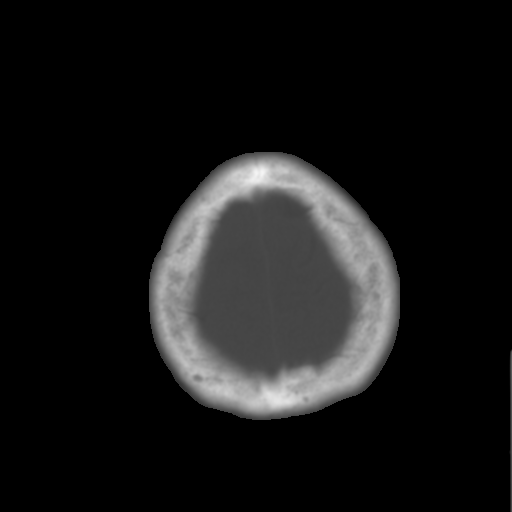
[im 27/30  brain]
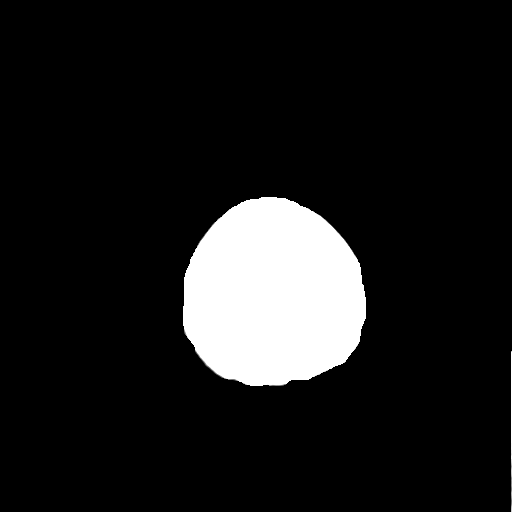

[Series 4: coronal soft tissue · coronal · 0.29mm/px · 3 of 63 slices shown]
[im 21/63  brain]
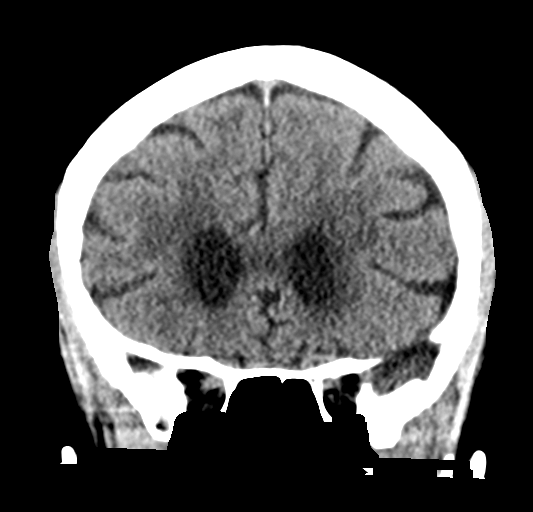
[im 28/63  brain]
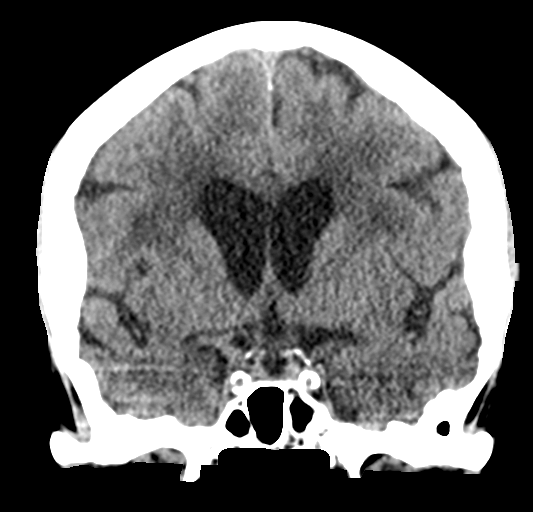
[im 35/63  brain]
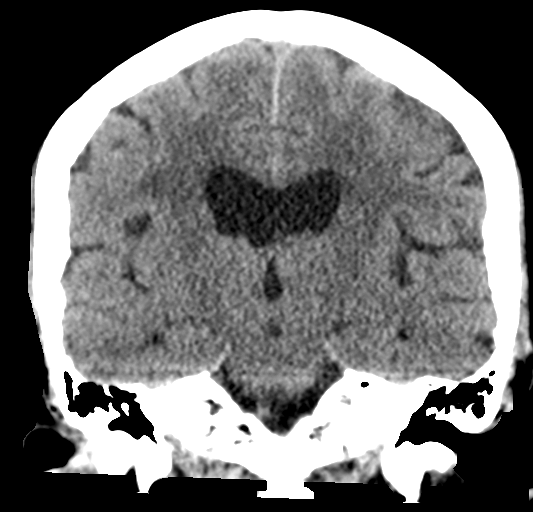

[Series 5: sagittal soft tissue · sagittal · 0.29mm/px · 3 of 51 slices shown]
[im 17/51  brain]
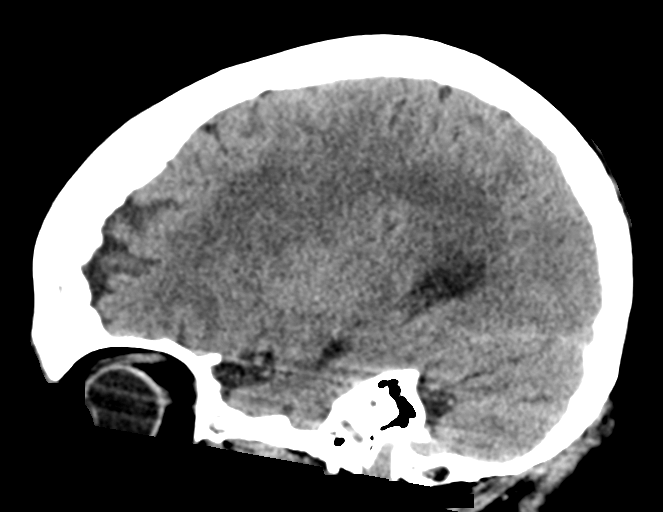
[im 26/51  brain]
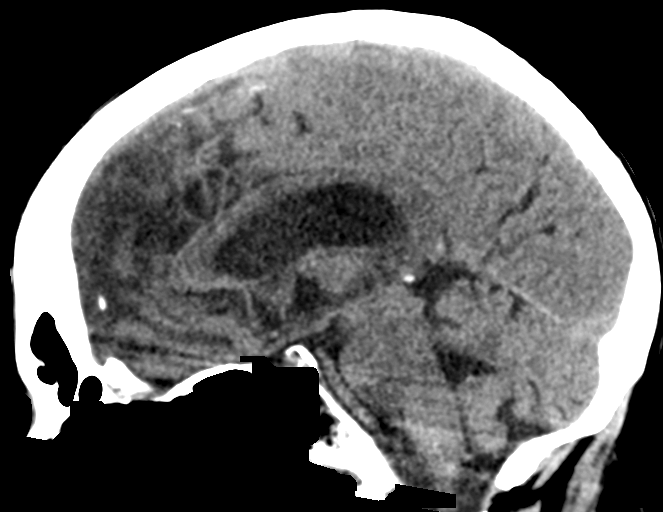
[im 34/51  brain]
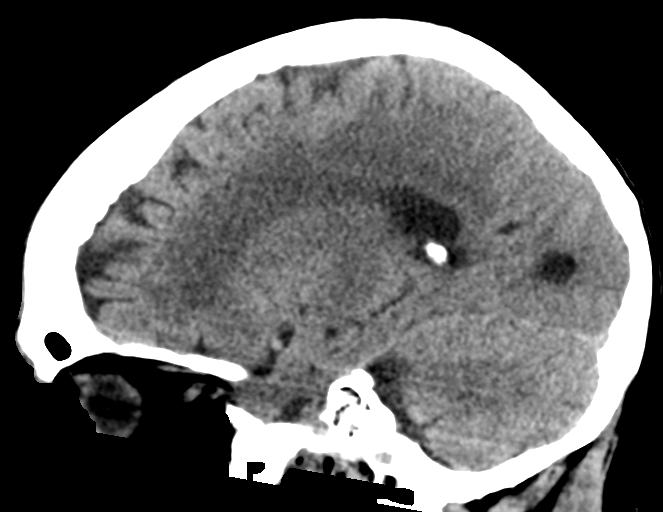

[16 of 47 positions shown; findings below may reference images not displayed]

FINDINGS: Brain: Mild chronic ischemic white matter disease is noted. No mass
effect or midline shift is noted. Ventricular size is within normal
limits. There is no evidence of mass lesion, hemorrhage or acute
infarction.

Vascular: No hyperdense vessel or unexpected calcification.

Skull: Normal. Negative for fracture or focal lesion.

Sinuses/Orbits: No acute finding.

Other: None.
IMPRESSION: Mild chronic ischemic white matter disease. No acute intracranial
abnormality seen.

## 2019-12-03 MED ORDER — ONDANSETRON HCL 4 MG/2ML IJ SOLN
4.0000 mg | INTRAMUSCULAR | Status: AC
  Administered 2019-12-03: 4 mg via INTRAVENOUS
  Filled 2019-12-03: qty 2

## 2019-12-03 MED ORDER — MORPHINE SULFATE (PF) 4 MG/ML IV SOLN
4.0000 mg | Freq: Once | INTRAVENOUS | Status: DC
  Filled 2019-12-03: qty 1

## 2019-12-03 MED ORDER — MORPHINE SULFATE (PF) 2 MG/ML IV SOLN
2.0000 mg | Freq: Once | INTRAVENOUS | Status: AC
  Administered 2019-12-03: 2 mg via INTRAVENOUS

## 2019-12-03 MED ORDER — OXYCODONE-ACETAMINOPHEN 5-325 MG PO TABS: 2.0000 | ORAL_TABLET | Freq: Once | ORAL | Status: DC

## 2019-12-03 NOTE — Discharge Instructions (Signed)
Fortunately you do not have an emergent medical condition that requires surgery tonight, but it is very important that you follow-up both with vascular surgery (Dr. Lucky Cowboy or one of his colleagues) as well as with Dr. Vickki Muff.  I provided the phone number for both clinics and I have sent a computer message to both practices so that they will help arrange your follow-up.  Continue to use your regular medications including your pain medicine from Dr. Dossie Arbour.      Return to the emergency department if you develop new or worsening symptoms that concern you.

## 2019-12-03 NOTE — ED Notes (Signed)
Patient discharged home with cousin. Patient still complaining of pain. VSS, NAD. Instructed to follow up later today with vascular surgery and podiatry

## 2019-12-03 NOTE — ED Notes (Signed)
Linen change, new gown on pt. purwick in place

## 2019-12-07 LAB — CULTURE, BLOOD (ROUTINE X 2)
Culture: NO GROWTH
Culture: NO GROWTH
Special Requests: ADEQUATE

## 2019-12-09 ENCOUNTER — Other Ambulatory Visit: Payer: Self-pay

## 2019-12-09 ENCOUNTER — Ambulatory Visit (INDEPENDENT_AMBULATORY_CARE_PROVIDER_SITE_OTHER): Payer: Medicare HMO | Admitting: Vascular Surgery

## 2019-12-09 VITALS — BP 119/80 | HR 105 | Resp 20 | Ht 64.0 in | Wt 134.0 lb

## 2019-12-09 DIAGNOSIS — I70269 Atherosclerosis of native arteries of extremities with gangrene, unspecified extremity: Secondary | ICD-10-CM | POA: Insufficient documentation

## 2019-12-09 DIAGNOSIS — I70261 Atherosclerosis of native arteries of extremities with gangrene, right leg: Secondary | ICD-10-CM | POA: Diagnosis not present

## 2019-12-09 DIAGNOSIS — E785 Hyperlipidemia, unspecified: Secondary | ICD-10-CM | POA: Diagnosis not present

## 2019-12-09 DIAGNOSIS — F17200 Nicotine dependence, unspecified, uncomplicated: Secondary | ICD-10-CM

## 2019-12-09 DIAGNOSIS — J449 Chronic obstructive pulmonary disease, unspecified: Secondary | ICD-10-CM

## 2019-12-09 NOTE — Patient Instructions (Signed)
Peripheral Vascular Disease  Peripheral vascular disease (PVD) is a disease of the blood vessels that are not part of your heart and brain. A simple term for PVD is poor circulation. In most cases, PVD narrows the blood vessels that carry blood from your heart to the rest of your body. This can reduce the supply of blood to your arms, legs, and internal organs, like your stomach or kidneys. However, PVD most often affects a person's lower legs and feet. Without treatment, PVD tends to get worse. PVD can also lead to acute ischemic limb. This is when an arm or leg suddenly cannot get enough blood. This is a medical emergency. Follow these instructions at home: Lifestyle  Do not use any products that contain nicotine or tobacco, such as cigarettes and e-cigarettes. If you need help quitting, ask your doctor.  Lose weight if you are overweight. Or, stay at a healthy weight as told by your doctor.  Eat a diet that is low in fat and cholesterol. If you need help, ask your doctor.  Exercise regularly. Ask your doctor for activities that are right for you. General instructions  Take over-the-counter and prescription medicines only as told by your doctor.  Take good care of your feet: ? Wear comfortable shoes that fit well. ? Check your feet often for any cuts or sores.  Keep all follow-up visits as told by your doctor This is important. Contact a doctor if:  You have cramps in your legs when you walk.  You have leg pain when you are at rest.  You have coldness in a leg or foot.  Your skin changes.  You are unable to get or have an erection (erectile dysfunction).  You have cuts or sores on your feet that do not heal. Get help right away if:  Your arm or leg turns cold, numb, and blue.  Your arms or legs become red, warm, swollen, painful, or numb.  You have chest pain.  You have trouble breathing.  You suddenly have weakness in your face, arm, or leg.  You become very  confused or you cannot speak.  You suddenly have a very bad headache.  You suddenly cannot see. Summary  Peripheral vascular disease (PVD) is a disease of the blood vessels.  A simple term for PVD is poor circulation. Without treatment, PVD tends to get worse.  Treatment may include exercise, low fat and low cholesterol diet, and quitting smoking. This information is not intended to replace advice given to you by your health care provider. Make sure you discuss any questions you have with your health care provider. Document Revised: 10/19/2017 Document Reviewed: 12/14/2016 Elsevier Patient Education  2020 [employer] Inc.  

## 2019-12-09 NOTE — Assessment & Plan Note (Signed)
lipid control important in reducing the progression of atherosclerotic disease. Continue statin therapy  

## 2019-12-09 NOTE — Progress Notes (Signed)
Patient ID: [name], female   DOB: 02-01-1946, 74 y.o.   MRN: [id number]  Chief Complaint  Patient presents with  . New Patient (Initial Visit)    [facility] ED consult    HPI [name] is a 74 y.o. female.  I am asked to see the patient by Dr. Karma Greaser in the [facility] ER for evaluation of PAD.  Over the past several weeks, the patient is developed black discoloration of her right third toe.  The distal half of the toe is clearly dead and almost mummified.  The base of the toe is purplish down almost to the metatarsal head.  She has also seen podiatry and understands that she will likely lose this toe.  She reports no fevers or chills.  No signs of systemic infection.  She reports that prior to this episode, she was having a lot of pain in her feet and lower legs with walking.  Her walking has become much less frequent with this new event.  She has not had any lower extremity interventions or bypass surgeries to her knowledge.  She has multiple atherosclerotic risk factors including ongoing tobacco use.  In the emergency department, she underwent a CT scan of the abdomen pelvis with lower extremity runoff which I have independently reviewed.  This demonstrates significant peripheral arterial disease.  She has aortoiliac disease that appears at least moderate to me bilaterally although the degree of stenosis is difficult to discern from the CT scan.  On the right leg, there is an occlusion of the tibioperoneal trunk and what appears to be a significant stenosis of the popliteal artery.  Tibial disease is very difficult to discern on a CT scan versus a very limited study for that.  The left leg appears to have better preservation of the flow distally although again is difficult to discern.     Past Medical History:  Diagnosis Date  . Anxiety   . CAD (coronary artery disease)   . Carpal tunnel syndrome   . Cataract   . Complication of anesthesia    has woken up at times  . COPD (chronic  obstructive pulmonary disease) ([place])   . CRP elevated 09/14/2015  . Depression   . Difficult intubation   . Dyslipidemia   . Dyspnea    DOE  . Dysrhythmia    hx palpatations  . Elevated sedimentation rate 09/14/2015  . Esophageal spasm   . Gastrointestinal parasites   . GERD (gastroesophageal reflux disease)   . Hiatal hernia   . History of peptic ulcer disease   . Hyperthyroidism   . Hypothyroidism   . Low magnesium levels 09/14/2015  . Pelvic fracture ([place]) 2008   fall from riding a horse  . Reflux   . Rotator cuff injury   . Stenosis, spinal, lumbar     Past Surgical History:  Procedure Laterality Date  . APPENDECTOMY    . BACK SURGERY     CERVICAL FUSION  . CARPAL TUNNEL RELEASE    . CATARACT EXTRACTION W/PHACO Right 08/07/2017   Procedure: CATARACT EXTRACTION PHACO AND INTRAOCULAR LENS PLACEMENT (IOC);  Surgeon: Birder Robson, MD;  Location: [facility] ORS;  Service: Ophthalmology;  Laterality: Right;  [place] 00:52.0 AP% 16.8 CDE 8.74 Fluid Pack Lot # [id number]  . CATARACT EXTRACTION W/PHACO Left 09/04/2017   Procedure: CATARACT EXTRACTION PHACO AND INTRAOCULAR LENS PLACEMENT (IOC);  Surgeon: Birder Robson, MD;  Location: [facility] ORS;  Service: Ophthalmology;  Laterality: Left;  [place] 00:34 AP%  17.0 CDE 5.80 Fluid pack lot # [id number]  . CESAREAN SECTION    . CHOLECYSTECTOMY    . FRACTURE SURGERY    . HIP SURGERY    . INTRAMEDULLARY (IM) NAIL INTERTROCHANTERIC Right 10/01/2016   Procedure: INTRAMEDULLARY (IM) NAIL INTERTROCHANTRIC;  Surgeon: Corky Mull, MD;  Location: [facility] ORS;  Service: Orthopedics;  Laterality: Right;  . KYPHOPLASTY N/A 08/20/2018   Procedure: [id number];  Surgeon: Hessie Knows, MD;  Location: [facility] ORS;  Service: Orthopedics;  Laterality: N/A;  . NECK SURGERY    . NOSE SURGERY    . ROTATOR CUFF REPAIR     x2  . SHOULDER ARTHROSCOPY  12/07/2011   Procedure: ARTHROSCOPY SHOULDER;  Surgeon: Ninetta Lights, MD;  Location: [facility];   Service: Orthopedics;  Laterality: Right;  Debridement Partial Cuff Tear, Release Coracoacromial Ligament  . SHOULDER SURGERY  12/07/2011   right     Family History  Problem Relation Age of Onset  . Aneurysm Other   No bleeding disorders.  No clotting disorders No porphyria No autoimmune diseases   Social History   Tobacco Use  . Smoking status: Current Every Day Smoker    Packs/day: 0.50    Types: Cigarettes  . Smokeless tobacco: Never Used  . Tobacco comment: down to 2 cigs per day  Substance Use Topics  . Alcohol use: No  . Drug use: No     No Known Allergies  Current Outpatient Medications  Medication Sig Dispense Refill  . ALPRAZolam (XANAX) 1 MG tablet Take 1 tablet (1 mg total) by mouth 4 (four) times daily as needed. (Patient taking differently: Take 1 mg by mouth 2 (two) times daily as needed. ) 30 tablet 0  . amLODipine (NORVASC) 5 MG tablet Take 1 tablet (5 mg total) by mouth daily. 30 tablet 1  . buPROPion (WELLBUTRIN SR) 150 MG 12 hr tablet Take 150 mg by mouth daily.     Marland Kitchen doxycycline (VIBRA-TABS) 100 MG tablet Take by mouth.    . escitalopram (LEXAPRO) 10 MG tablet Take 10 mg by mouth daily.     . Ipratropium-Albuterol (COMBIVENT RESPIMAT) 20-100 MCG/ACT AERS respimat Inhale 1 puff into the lungs every 6 (six) hours as needed for wheezing or shortness of breath.    Marland Kitchen ipratropium-albuterol (DUONEB) 0.5-2.5 (3) MG/3ML SOLN Take 3 mLs by nebulization every 4 (four) hours as needed. 360 mL 1  . levothyroxine (SYNTHROID, LEVOTHROID) 88 MCG tablet Take 88 mcg by mouth daily before breakfast.     . montelukast (SINGULAIR) 10 MG tablet Take 10 mg by mouth daily.    Marland Kitchen oxyCODONE (OXY IR/ROXICODONE) 5 MG immediate release tablet Take 1 tablet (5 mg total) by mouth every 6 (six) hours as needed for severe pain. Must last 30 days 120 tablet 0  . tizanidine (ZANAFLEX) 2 MG capsule Take 1 capsule (2 mg total) by mouth 3 (three) times daily as needed for muscle spasms. 90  capsule 5  . traZODone (DESYREL) 100 MG tablet Take 1 tablet (100 mg total) by mouth at bedtime. (Patient taking differently: Take 200 mg by mouth at bedtime. ) 30 tablet 2  . Calcium Carb-Cholecalciferol (CALCIUM PLUS D3 ABSORBABLE) [phone] MG-UNIT CAPS Take 1 capsule by mouth 2 (two) times daily with a meal. (Patient not taking: Reported on 12/09/2019) 60 capsule 5  . ergocalciferol (VITAMIN D2) 1.25 MG (50000 UT) capsule Take 1 capsule (50,000 Units total) by mouth 2 (two) times a week. X 6 weeks. (Patient not  taking: Reported on 12/09/2019) 12 capsule 1  . Fluticasone-Salmeterol (ADVAIR DISKUS) 250-50 MCG/DOSE AEPB Inhale 1 puff into the lungs 2 (two) times daily. 60 each 11  . Magnesium 500 MG CAPS Take 1 capsule (500 mg total) by mouth daily with breakfast AND 1 capsule (500 mg total) at bedtime. (Patient not taking: Reported on 12/09/2019) 60 capsule 5  . magnesium oxide (MAG-OX) 400 MG tablet Take 1 tablet (400 mg total) by mouth 2 (two) times daily. (Patient not taking: Reported on 12/09/2019) 180 tablet 3  . oxyCODONE (OXY IR/ROXICODONE) 5 MG immediate release tablet Take 1 tablet (5 mg total) by mouth every 6 (six) hours as needed for severe pain. Must last 30 days 120 tablet 0  . oxyCODONE (OXY IR/ROXICODONE) 5 MG immediate release tablet Take 1 tablet (5 mg total) by mouth every 6 (six) hours as needed for severe pain. Must last 30 days 120 tablet 0   No current facility-administered medications for this visit.      REVIEW OF SYSTEMS (Negative unless checked)  Constitutional: [] Weight loss  [] Fever  [] Chills Cardiac: [] Chest pain   [] Chest pressure   [x] Palpitations   [] Shortness of breath when laying flat   [] Shortness of breath at rest   [] Shortness of breath with exertion. Vascular:  [x] Pain in legs with walking   [] Pain in legs at rest   [] Pain in legs when laying flat   [] Claudication   [x] Pain in feet when walking  [x] Pain in feet at rest  [x] Pain in feet when laying flat    [] History of DVT   [] Phlebitis   [] Swelling in legs   [] Varicose veins   [] Non-healing ulcers Pulmonary:   [] Uses home oxygen   [] Productive cough   [] Hemoptysis   [] Wheeze  [] COPD   [] Asthma Neurologic:  [] Dizziness  [] Blackouts   [] Seizures   [] History of stroke   [] History of TIA  [] Aphasia   [] Temporary blindness   [] Dysphagia   [] Weakness or numbness in arms   [] Weakness or numbness in legs Musculoskeletal:  [x] Arthritis   [] Joint swelling   [x] Joint pain   [] Low back pain Hematologic:  [] Easy bruising  [] Easy bleeding   [] Hypercoagulable state   [] Anemic  [] Hepatitis Gastrointestinal:  [] Blood in stool   [] Vomiting blood  [x] Gastroesophageal reflux/heartburn   [] Abdominal pain Genitourinary:  [] Chronic kidney disease   [] Difficult urination  [] Frequent urination  [] Burning with urination   [] Hematuria Skin:  [] Rashes   [] Ulcers   [] Wounds Psychological:  [x] History of anxiety   [x]  History of major depression.    Physical Exam BP 119/80 (BP Location: Left Arm)   Pulse (!) 105   Resp 20   Ht 5\' 4"  (1.626 m)   Wt 134 lb (60.8 kg)   BMI 23.00 kg/m  Gen:  WD/WN, NAD Head: [place]/AT, No temporalis wasting. Ear/Nose/Throat: Hearing grossly intact, nares w/o erythema or drainage, oropharynx w/o Erythema/Exudate Eyes: Conjunctiva clear, sclera non-icteric  Neck: trachea midline.  No JVD.  Pulmonary:  Good air movement, respirations not labored, no use of accessory muscles  Cardiac: RRR, no JVD Vascular:  Vessel Right Left  Radial Palpable Palpable                          DP Trace  1+  PT NP 1+   Gastrointestinal:. No masses, surgical incisions, or scars. Musculoskeletal: M/S 5/5 throughout.  No edema.  The right third toe is mummified at the tip and dark and discolored  down to the base of the toe.  No significant surrounding erythema.  No drainage.  Her foot is very tender to the touch.  No open wounds on the left leg.  Fairly dry skin in both feet and ankles. Neurologic:  Sensation grossly intact in extremities.  Symmetrical.  Speech is fluent. Motor exam as listed above. Psychiatric: Judgment intact, Mood & affect appropriate for pt's clinical situation. Dermatologic: No rashes or ulcers noted.  Right third toe as above    Radiology CT Angio Aortobifemoral W and/or Wo Contrast  Result Date: 12/03/2019 CLINICAL DATA:  Pain and discoloration in the third toe of the right foot EXAM: CT ANGIOGRAPHY AOBIFEM WITHOUT AND WITH CONTRAST TECHNIQUE: CT angiography of the aorta with bilateral runoff was performed. Multiplanar reconstruction and MIP imaging reconstructions were generated. CONTRAST:  148mL OMNIPAQUE IOHEXOL 350 MG/ML SOLN COMPARISON:  Lumbar MRI 08/18/2018, CT abdomen pelvis 01/15/2007 FINDINGS: ABDOMINOPELVIC VASCULATURE: Abdominal aorta: Extensive atherosclerotic calcification of the native abdominal aorta without evidence of aneurysm, ectasia, dissection, periaortic stranding or hemorrhage or vasculitis. No significant stenosis or occlusion. Celiac artery: Ostial plaque with mild narrowing. Minimal atherosclerotic plaque within the proximal vessel branches most notably in the celiac artery. No evidence of aneurysm, dissection or vasculitis. SMA: Calcified noncalcified plaque at the ostium of the SMA origin resulting in mild narrowing. No other significant stenosis. No evidence of aneurysm, dissection or vasculitis. Right Renal artery: Single right renal artery with mild ostial narrowing and minimal plaque within the mid right renal artery. No evidence of aneurysm, dissection, vasculitis or fibromuscular dysplasia. Left Renal artery: Single left renal artery with mild ostial narrowing and calcified noncalcified plaque within the mid to distal vessel and branches. No flow-limiting stenosis or occlusion. No evidence of aneurysm, dissection, vasculitis or fibromuscular dysplasia IMA: Calcified noncalcified plaque results in severe ostial narrowing of the IMA. Vessel  otherwise normally opacifies without significant distal stenosis, occlusion, aneurysm, dissection, or features of vasculitis -------------------------------------------------------------------------------- RIGHT LOWER EXTREMITY VASCULATURE: Right-sided pelvic vasculature: Calcified noncalcified plaque within the common iliac arteries all ting and some mild to moderate luminal narrowing. Additional plaque at the iliac artery bifurcation resulting in moderate narrowing of the proximal internal iliac artery. The internal iliac branches are moderately diseased. Iliac artery branches are diseased without significant stenosis, occlusion, dissection or aneurysm Right common femoral artery: Common femoral artery is mildly diseased. No evidence of aneurysm, dissection or vasculitis. Right deep femoral artery: Ostial plaque with mild narrowing. No distal narrowing. Normal opacification of the proximal branches. Right superficial femoral artery: Superficial femoral artery is heavily diseased towards the distal extent near the Hunter's canal. No occlusion, aneurysm or dissection. Right popliteal artery: Heavily diseased popliteal artery without aneurysm. Stenosis or occlusion. Right lower leg: Anterior tibial artery normally branches. There is a heavily diseased proximal tibioperoneal trunk with likely proximal segmental occlusion and some distal reconstitution but with diminished opacification of peroneal artery which is completely attenuated by the level of the distal lower leg and ankle. Posterior tibial artery more normally opacifies likely supplied by collaterals. Dorsal and pedal arches are opacified but are somewhat attenuated. --------------------------------------------------------------------------------- LEFT LOWER EXTREMITY VASCULATURE: Left-sided pelvic vasculature: Calcified noncalcified plaque within the common iliac artery. No aneurysm or ectasia. No acute luminal abnormality. Atheromatous plaque at the iliac  artery bifurcations. Proximal internal iliac branches are heavily diseased. External iliac artery is moderately diseased without flow-limiting stenosis. Left common femoral artery: Moderate plaque without dissection, stenosis or aneurysm. Left deep femoral artery: Mild plaque at the bifurcation with normal  opacification the proximal branches. Left superficial femoral artery: Moderate plaque in the distal superficial femoral artery prior to entering Hunter's canal. Left popliteal artery: Extensive atherosclerotic calcification of the popliteal artery without flow-limiting stenosis, occlusion or aneurysm. Left lower leg: Normal branching of the anterior tibial and tibioperoneal trunk. Anterior tibial artery normally opacifies. Tibioperoneal trunk is moderately disease. Normal branching of the posterior tibial and peroneal arteries. The peroneal artery attenuates by the level of the ankle. Normal opacification of the dorsal and pedal arches Review of the MIP images confirms the above findings. -------------------------------------------------------------------------------- Nonvascular Findings: Lower chest: 3 mm nodule in the posterior right lower lobe (4/5). No consolidative opacity or effusion. Normal heart size. No pericardial effusion. Extensive coronary artery calcifications are present. Moderate fluid-filled hiatal hernia. Hepatobiliary: No focal hepatic lesions. Patient is post cholecystectomy. There is marked intra and extrahepatic biliary ductal dilatation though this finding is similar to comparison 10/21/2018. no visible calcified intraductal gallstones are seen. Pancreas: Atrophic appearance of the pancreas. Slight prominence of the pancreatic duct without frank ductal dilatation. No visible pancreatic masses. Spleen: Normal patchy arterial enhancement of the spleen. No focal splenic lesion. Normal splenic size Adrenals/Urinary Tract: Nodular thickening of the adrenal glands is unchanged from comparison in  2019, nonspecific but such appearance can be seen with senescent hypertrophy. Multiple fluid attenuation cysts are present in both kidneys. Intermediate attenuation cyst in the anterior lower pole left kidney measures up to 1.5 cm. Incompletely characterized on this single-phase study. No other worrisome or suspicious renal lesions. No urolithiasis or hydronephrosis. Urinary bladder is largely decompressed at the time of exam and therefore poorly evaluated by CT imaging. Stomach/Bowel: Fluid-filled moderate hiatal hernia. Distal stomach and duodenum are unremarkable. No small bowel dilatation or wall thickening. The appendix is surgically absent. No colonic dilatation or wall thickening. Scattered colonic diverticula without focal pericolonic inflammation to suggest diverticulitis. No evidence of obstruction Lymphatic: No suspicious or enlarged lymph nodes in the included lymphatic chains. Reproductive: Anteverted uterus. No concerning adnexal lesions. Other: No free fluid. No free air. Lateral body wall edema. Diastasis rectus in the anterior abdomen. Decubitus skin thickening superficial to the sacrum. Musculoskeletal: Multilevel compression deformities of the lumbar spine which include: New superior endplate deformity at L4 with approximately 40% height loss. Superior endplate compression deformity at L5, similar to prior with 20% height loss. Schmorl's node formation at T12 with remote superior endplate deformity with 20% height loss. Superior endplate compression deformity at T11 with 40% height loss anteriorly similar to comparison radiograph 10/21/2018. Vertebroplasty changes at L3 with 20% residual height loss. Multilevel degenerative changes are present in the imaged portions of the spine. Additional degenerative changes present in both hips, SI joints and symphysis pubis. Right femoral intramedullary nail and transcervical fixation with postsurgical changes and heterotopic ossification multilevel  compression deformities of the lumbar spine of the right greater trochanter. Moderate right and mild left tricompartmental degenerative changes of the knees. Extensive degenerative changes of the ankles and feet. 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 Review of the MIP images confirms the above findings. IMPRESSION: 1. Extensive atherosclerotic calcification of the abdominal aorta and bilateral lower extremities. ( Aortic Atherosclerosis (ICD10-I70.0). 2. Right lower extremity: Heavily diseased tibioperoneal trunk likely with segmental occlusion and some distal reconstitution but 2 vessel runoff with attenuation the peroneal artery by the level of mid to lower leg. Attenuated opacification of the dorsal pedal arches. 3. Left lower leg: Heavily  diseased popliteal artery without flow-limiting stenosis, occlusion or aneurysm. Three-vessel runoff to the level of the left ankle 4. Degenerative changes in the feet and ankles. No acute fracture or traumatic malalignment is seen in the right foot though should be better assessed on comparison radiography. Erosive changes of the head of the fifth proximal phalanx are noted. 5. Multilevel compression deformities of the lumbar spine, detailed as above. New superior endplate compression deformity at L4 with approximately 40% height loss. 6. Moderate fluid-filled hiatal hernia. 7. Marked intra and extrahepatic biliary ductal dilatation, similar to comparison 10/21/2018. No visible calcified intraductal gallstones. Recommend correlation with LFTs. If there is concern for biliary obstruction, consider further evaluation with MRI as clinically indicated. 8. Decubitus skin thickening superficial to the sacrum. Recommend correlation with direct inspection. 9. Colonic diverticulosis without evidence of diverticulitis. 10. Additional ancillary findings as described above. Electronically Signed    By: Lovena Le M.D.   On: 12/03/2019 00:02   DG Foot Complete Right  Result Date: 12/02/2019 CLINICAL DATA:  Necrotic middle toe EXAM: RIGHT FOOT COMPLETE - 3+ VIEW COMPARISON:  None. FINDINGS: No acute bony abnormality. Specifically, no fracture, subluxation, or dislocation. No bone destruction to suggest osteomyelitis. Soft tissues are intact. IMPRESSION: No acute bony abnormality. Electronically Signed   By: Rolm Baptise M.D.   On: 12/02/2019 23:50    Labs Recent Results (from the past 2160 hour(s))  [facility] Select 13 (MW), Urine     Status: None   Collection Time: 10/06/19  2:00 PM  Result Value Ref Range   Summary Note     Comment: ==================================================================== [employer] Select 13 (MW) ==================================================================== Test                             Result       Flag       Units Drug Present and Declared for Prescription Verification   Alprazolam                     574          EXPECTED   ng/mg creat   Alpha-hydroxyalprazolam        >1587        EXPECTED   ng/mg creat    Source of alprazolam is a scheduled prescription medication. Alpha-    hydroxyalprazolam is an expected metabolite of alprazolam.   Oxycodone                      4542         EXPECTED   ng/mg creat   Oxymorphone                    3763         EXPECTED   ng/mg creat   Noroxycodone                   >7937        EXPECTED   ng/mg creat   Noroxymorphone                 548          EXPECTED   ng/mg creat    Sources of oxycodone are scheduled prescription medications.    Oxymorphone, noroxycodone, and noroxymorphone are expected    metabolites of oxycodone. O xymorphone is also available as a    scheduled prescription medication. ==================================================================== Test  Result    Flag   Units      Ref Range   Creatinine              126              mg/dL       >=20 ==================================================================== Declared Medications:  The flagging and interpretation on this report are based on the  following declared medications.  Unexpected results may arise from  inaccuracies in the declared medications.  **Note: The testing scope of this panel includes these medications:  Alprazolam  Oxycodone  **Note: The testing scope of this panel does not include the  following reported medications:  Albuterol (Combivent)  Albuterol (Duoneb)  Amlodipine  Bupropion  Escitalopram (Lexapro)  Fluticasone (Advair)  Ipratropium (Combivent)  Ipratropium (Duoneb)  Levothyroxine (Synthroid)  Magnesium  Montelukast (Singulair)  Salmeterol (Advair)  Tizanidine   Trazodone ==================================================================== For clinical consultation, please call [phone]. ====================================================================   Comp. Metabolic Panel (12)     Status: Abnormal   Collection Time: 10/06/19  3:52 PM  Result Value Ref Range   Glucose 96 65 - 99 mg/dL   BUN 7 (L) 8 - 27 mg/dL   Creatinine, Ser 0.81 0.57 - 1.00 mg/dL   GFR calc non Af Amer 73 >59 mL/min/1.73   GFR calc Af Amer 84 >59 mL/min/1.73   BUN/Creatinine Ratio 9 (L) 12 - 28   Sodium 143 134 - 144 mmol/L   Potassium 4.2 3.5 - 5.2 mmol/L   Chloride 102 96 - 106 mmol/L   Calcium 9.2 8.7 - 10.3 mg/dL   Total Protein 6.2 6.0 - 8.5 g/dL   Albumin 3.7 3.7 - 4.7 g/dL   Globulin, Total 2.5 1.5 - 4.5 g/dL   Albumin/Globulin Ratio 1.5 1.2 - 2.2   Bilirubin Total <0.2 0.0 - 1.2 mg/dL   Alkaline Phosphatase 123 (H) 39 - 117 IU/L   AST 16 0 - 40 IU/L  Magnesium     Status: Abnormal   Collection Time: 10/06/19  3:52 PM  Result Value Ref Range   Magnesium 1.5 (L) 1.6 - 2.3 mg/dL  Vitamin B12     Status: None   Collection Time: 10/06/19  3:52 PM  Result Value Ref Range   Vitamin B-12 331 232 - 1,245 pg/mL  Sedimentation rate      Status: None   Collection Time: 10/06/19  3:52 PM  Result Value Ref Range   Sed Rate 15 0 - 40 mm/hr  25-Hydroxyvitamin D Lcms D2+D3     Status: Abnormal   Collection Time: 10/06/19  3:52 PM  Result Value Ref Range   25-Hydroxy, Vitamin D 15 (L) ng/mL    Comment: Reference Range: All Ages: Target levels 30 - 100    25-Hydroxy, Vitamin D-2 <1.0 ng/mL    Comment: This test was developed and its performance characteristics determined by [facility]. It has not been cleared or approved by the Food and Drug Administration.    25-Hydroxy, Vitamin D-3 14 ng/mL    Comment: This test was developed and its performance characteristics determined by [facility]. It has not been cleared or approved by the Food and Drug Administration.   C-reactive protein     Status: None   Collection Time: 10/06/19  3:52 PM  Result Value Ref Range   CRP 5 0 - 10 mg/L  Comprehensive metabolic panel     Status: Abnormal   Collection Time: 12/02/19 10:23 PM  Result Value Ref Range   Sodium  138 135 - 145 mmol/L   Potassium 3.3 (L) 3.5 - 5.1 mmol/L   Chloride 100 98 - 111 mmol/L   CO2 28 22 - 32 mmol/L   Glucose, Bld 151 (H) 70 - 99 mg/dL   BUN 11 8 - 23 mg/dL   Creatinine, Ser 0.87 0.44 - 1.00 mg/dL   Calcium 8.7 (L) 8.9 - 10.3 mg/dL   Total Protein 6.5 6.5 - 8.1 g/dL   Albumin 3.2 (L) 3.5 - 5.0 g/dL   AST 21 15 - 41 U/L   ALT 57 (H) 0 - 44 U/L   Alkaline Phosphatase 148 (H) 38 - 126 U/L   Total Bilirubin 0.8 0.3 - 1.2 mg/dL   GFR calc non Af Amer >60 >60 mL/min   GFR calc Af Amer >60 >60 mL/min   Anion gap 10 5 - 15    Comment: Performed at [facility], [address] [place], [place] [address]  CBC with Differential     Status: Abnormal   Collection Time: 12/02/19 10:23 PM  Result Value Ref Range   WBC 9.8 4.0 - 10.5 K/uL   RBC 4.18 3.87 - 5.11 MIL/uL   Hemoglobin 11.7 (L) 12.0 - 15.0 g/dL   HCT 37.2 36.0 - 46.0 %   MCV 89.0 80.0 - 100.0 fL   MCH 28.0 26.0 - 34.0 pg   MCHC 31.5 30.0 -  36.0 g/dL   RDW 13.5 11.5 - 15.5 %   Platelets 309 150 - 400 K/uL   nRBC 0.0 0.0 - 0.2 %   Neutrophils Relative % 77 %   Neutro Abs 7.5 1.7 - 7.7 K/uL   Lymphocytes Relative 15 %   Lymphs Abs 1.5 0.7 - 4.0 K/uL   Monocytes Relative 6 %   Monocytes Absolute 0.6 0.1 - 1.0 K/uL   Eosinophils Relative 1 %   Eosinophils Absolute 0.1 0.0 - 0.5 K/uL   Basophils Relative 1 %   Basophils Absolute 0.1 0.0 - 0.1 K/uL   Immature Granulocytes 0 %   Abs Immature Granulocytes 0.03 0.00 - 0.07 K/uL    Comment: Performed at [facility], [address] [place], [place] [address]  Lactic acid, plasma     Status: None   Collection Time: 12/02/19 10:23 PM  Result Value Ref Range   Lactic Acid, Venous 1.2 0.5 - 1.9 mmol/L    Comment: Performed at [facility], [address] [place], [place] [address]  Culture, blood (routine x 2)     Status: None   Collection Time: 12/02/19 10:23 PM   Specimen: BLOOD  Result Value Ref Range   Specimen Description BLOOD BLOOD RIGHT FOREARM    Special Requests      BOTTLES DRAWN AEROBIC AND ANAEROBIC Blood Culture adequate volume   Culture      NO GROWTH 5 DAYS Performed at [facility], [address] [place], [place] [address]    Report Status 12/07/2019 FINAL   Culture, blood (routine x 2)     Status: None   Collection Time: 12/02/19 10:23 PM   Specimen: BLOOD  Result Value Ref Range   Specimen Description BLOOD LEFT ANTECUBITAL    Special Requests      BOTTLES DRAWN AEROBIC AND ANAEROBIC Blood Culture results may not be optimal due to an excessive volume of blood received in culture bottles   Culture      NO GROWTH 5 DAYS Performed at [facility], [place],  [place] [address]    Report Status 12/07/2019 FINAL     Assessment/Plan:  Chronic obstructive pulmonary disease (COPD) ([place]) Continue pulmonary medications and aerosols as already ordered, these medications have been reviewed and there are  no changes at this time.    HLD (hyperlipidemia) lipid control important in reducing the progression of atherosclerotic disease. Continue statin therapy   Tobacco use disorder We had a discussion for approximately 3-4 minutes regarding the absolute need for smoking cessation due to the deleterious nature of tobacco on the vascular system. We discussed the tobacco use would diminish patency of any intervention, and likely significantly worsen progressio of disease. We discussed multiple agents for quitting including replacement therapy or medications to reduce cravings such as Chantix. The patient voices their understanding of the importance of smoking cessation.  Atherosclerosis of native arteries of the extremities with gangrene [facility]) she underwent a CT scan of the abdomen pelvis with lower extremity runoff which I have independently reviewed.  This demonstrates significant peripheral arterial disease.  She has aortoiliac disease that appears at least moderate to me bilaterally although the degree of stenosis is difficult to discern from the CT scan.  On the right leg, there is an occlusion of the tibioperoneal trunk and what appears to be a significant stenosis of the popliteal artery.  Tibial disease is very difficult to discern on a CT scan versus a very limited study for that.  The left leg appears to have better preservation of the flow distally although again is difficult to discern.   This is clearly a critical and limb threatening situation.  I had a long discussion today with the patient and her family member of the grave nature of the situation.  She is going to lose the toe.  Without revascularization I do not think her toe amputation is likely to heal.  I would recommend a right lower extremity angiogram with possible revascularization to be performed to try to improve her perfusion for wound healing.  I discussed the risks and benefits the procedure.  I discussed the natural history and  the pathophysiology of peripheral arterial disease.  I strongly recommended smoking cessation.  The patient and her family voiced their understanding and agree with our plan of care.  Angiogram will be performed at the next available opening at her convenience.       Leotis Pain 12/09/2019, 4:20 PM   This note was created with Dragon medical transcription system.  Any errors from dictation are unintentional.

## 2019-12-09 NOTE — Assessment & Plan Note (Signed)
We had a discussion for approximately 3-4 minutes regarding the absolute need for smoking cessation due to the deleterious nature of tobacco on the vascular system. We discussed the tobacco use would diminish patency of any intervention, and likely significantly worsen progressio of disease. We discussed multiple agents for quitting including replacement therapy or medications to reduce cravings such as Chantix. The patient voices their understanding of the importance of smoking cessation.  

## 2019-12-09 NOTE — Assessment & Plan Note (Signed)
Continue pulmonary medications and aerosols as already ordered, these medications have been reviewed and there are no changes at this time.   

## 2019-12-09 NOTE — Assessment & Plan Note (Signed)
she underwent a CT scan of the abdomen pelvis with lower extremity runoff which I have independently reviewed.  This demonstrates significant peripheral arterial disease.  She has aortoiliac disease that appears at least moderate to me bilaterally although the degree of stenosis is difficult to discern from the CT scan.  On the right leg, there is an occlusion of the tibioperoneal trunk and what appears to be a significant stenosis of the popliteal artery.  Tibial disease is very difficult to discern on a CT scan versus a very limited study for that.  The left leg appears to have better preservation of the flow distally although again is difficult to discern.   This is clearly a critical and limb threatening situation.  I had a long discussion today with the patient and her family member of the grave nature of the situation.  She is going to lose the toe.  Without revascularization I do not think her toe amputation is likely to heal.  I would recommend a right lower extremity angiogram with possible revascularization to be performed to try to improve her perfusion for wound healing.  I discussed the risks and benefits the procedure.  I discussed the natural history and the pathophysiology of peripheral arterial disease.  I strongly recommended smoking cessation.  The patient and her family voiced their understanding and agree with our plan of care.  Angiogram will be performed at the next available opening at her convenience.

## 2019-12-10 ENCOUNTER — Telehealth (INDEPENDENT_AMBULATORY_CARE_PROVIDER_SITE_OTHER): Payer: Self-pay

## 2019-12-10 NOTE — Telephone Encounter (Signed)
Spoke with the patient and she is now scheduled with Dr. Lucky Cowboy for right leg angio on 12/18/19 with a 9:45 am arrival time to the [facility]. Patient will do covid testing on 12/16/19 between 12:30-2:30 pm at the [facility]. Pre-procedure instructions were discussed and will be mailed to the patient.

## 2019-12-11 ENCOUNTER — Other Ambulatory Visit (INDEPENDENT_AMBULATORY_CARE_PROVIDER_SITE_OTHER): Payer: Self-pay | Admitting: Nurse Practitioner

## 2019-12-11 NOTE — Telephone Encounter (Signed)
I contacted the patient and gave her the recommendation from Dr. Lucky Cowboy regarding the pain medication and that because she is under contract with pain management we are unable to give more or stronger pain medication. Patient is scheduled for a leg angio on 12/18/19.

## 2019-12-16 ENCOUNTER — Other Ambulatory Visit: Payer: Self-pay

## 2019-12-16 ENCOUNTER — Other Ambulatory Visit
Admission: RE | Admit: 2019-12-16 | Discharge: 2019-12-16 | Disposition: A | Discharge status: Home / Self Care | Payer: Medicare HMO | Source: Ambulatory Visit | Attending: Vascular Surgery | Admitting: Vascular Surgery

## 2019-12-16 DIAGNOSIS — Z20822 Contact with and (suspected) exposure to covid-19: Secondary | ICD-10-CM | POA: Insufficient documentation

## 2019-12-16 DIAGNOSIS — Z01812 Encounter for preprocedural laboratory examination: Secondary | ICD-10-CM | POA: Diagnosis present

## 2019-12-17 ENCOUNTER — Encounter: Payer: Self-pay | Admitting: Pain Medicine

## 2019-12-17 LAB — SARS CORONAVIRUS 2 (TAT 6-24 HRS): SARS Coronavirus 2: NEGATIVE

## 2019-12-18 ENCOUNTER — Encounter: Payer: Self-pay | Admitting: Vascular Surgery

## 2019-12-18 ENCOUNTER — Other Ambulatory Visit (INDEPENDENT_AMBULATORY_CARE_PROVIDER_SITE_OTHER): Payer: Self-pay | Admitting: Nurse Practitioner

## 2019-12-18 ENCOUNTER — Other Ambulatory Visit: Payer: Self-pay

## 2019-12-18 ENCOUNTER — Encounter
Admission: RE | Disposition: A | Discharge status: Home / Self Care | Payer: Self-pay | Source: Home / Self Care | Attending: Vascular Surgery

## 2019-12-18 ENCOUNTER — Ambulatory Visit
Admission: RE | Admit: 2019-12-18 | Discharge: 2019-12-18 | Disposition: A | Discharge status: Home / Self Care | Payer: Medicare HMO | Attending: Vascular Surgery | Admitting: Vascular Surgery

## 2019-12-18 DIAGNOSIS — I70269 Atherosclerosis of native arteries of extremities with gangrene, unspecified extremity: Secondary | ICD-10-CM

## 2019-12-18 DIAGNOSIS — H269 Unspecified cataract: Secondary | ICD-10-CM | POA: Insufficient documentation

## 2019-12-18 DIAGNOSIS — E785 Hyperlipidemia, unspecified: Secondary | ICD-10-CM | POA: Insufficient documentation

## 2019-12-18 DIAGNOSIS — I2510 Atherosclerotic heart disease of native coronary artery without angina pectoris: Secondary | ICD-10-CM | POA: Insufficient documentation

## 2019-12-18 DIAGNOSIS — Z7951 Long term (current) use of inhaled steroids: Secondary | ICD-10-CM | POA: Insufficient documentation

## 2019-12-18 DIAGNOSIS — J449 Chronic obstructive pulmonary disease, unspecified: Secondary | ICD-10-CM | POA: Diagnosis not present

## 2019-12-18 DIAGNOSIS — Z79899 Other long term (current) drug therapy: Secondary | ICD-10-CM | POA: Insufficient documentation

## 2019-12-18 DIAGNOSIS — K219 Gastro-esophageal reflux disease without esophagitis: Secondary | ICD-10-CM | POA: Insufficient documentation

## 2019-12-18 DIAGNOSIS — I70261 Atherosclerosis of native arteries of extremities with gangrene, right leg: Secondary | ICD-10-CM

## 2019-12-18 DIAGNOSIS — E039 Hypothyroidism, unspecified: Secondary | ICD-10-CM | POA: Insufficient documentation

## 2019-12-18 DIAGNOSIS — F17210 Nicotine dependence, cigarettes, uncomplicated: Secondary | ICD-10-CM | POA: Diagnosis not present

## 2019-12-18 DIAGNOSIS — Z79890 Hormone replacement therapy (postmenopausal): Secondary | ICD-10-CM | POA: Insufficient documentation

## 2019-12-18 DIAGNOSIS — G5600 Carpal tunnel syndrome, unspecified upper limb: Secondary | ICD-10-CM | POA: Insufficient documentation

## 2019-12-18 HISTORY — PX: LOWER EXTREMITY ANGIOGRAPHY: CATH118251

## 2019-12-18 LAB — CREATININE, SERUM
Creatinine, Ser: 0.86 mg/dL (ref 0.44–1.00)
GFR calc Af Amer: >60 mL/min (ref >60)
GFR calc non Af Amer: >60 mL/min (ref >60)

## 2019-12-18 LAB — BUN: BUN: 11 mg/dL (ref 8–23)

## 2019-12-18 SURGERY — LOWER EXTREMITY ANGIOGRAPHY
Anesthesia: Moderate Sedation | Site: Leg Lower | Laterality: Right

## 2019-12-18 MED ORDER — METHYLPREDNISOLONE SODIUM SUCC 125 MG IJ SOLR: 125.0000 mg | Freq: Once | INTRAMUSCULAR | Status: DC | PRN

## 2019-12-18 MED ORDER — HEPARIN SODIUM (PORCINE) 1000 UNIT/ML IJ SOLN
INTRAMUSCULAR | Status: AC
  Filled 2019-12-18: qty 1

## 2019-12-18 MED ORDER — FENTANYL CITRATE (PF) 100 MCG/2ML IJ SOLN
INTRAMUSCULAR | Status: AC
  Filled 2019-12-18: qty 2

## 2019-12-18 MED ORDER — FAMOTIDINE 20 MG PO TABS: 40.0000 mg | ORAL_TABLET | Freq: Once | ORAL | Status: DC | PRN

## 2019-12-18 MED ORDER — MIDAZOLAM HCL 5 MG/5ML IJ SOLN
INTRAMUSCULAR | Status: AC
  Filled 2019-12-18: qty 5

## 2019-12-18 MED ORDER — CLINDAMYCIN PHOSPHATE 300 MG/50ML IV SOLN
INTRAVENOUS | Status: AC
  Filled 2019-12-18: qty 50

## 2019-12-18 MED ORDER — IODIXANOL 320 MG/ML IV SOLN
INTRAVENOUS | Status: DC | PRN
  Administered 2019-12-18: 12:00:00 75 mL

## 2019-12-18 MED ORDER — ONDANSETRON HCL 4 MG/2ML IJ SOLN: 4.0000 mg | Freq: Four times a day (QID) | INTRAMUSCULAR | Status: DC | PRN

## 2019-12-18 MED ORDER — CLOPIDOGREL BISULFATE 75 MG PO TABS
ORAL_TABLET | ORAL | Status: AC
  Administered 2019-12-18: 300 mg via ORAL
  Filled 2019-12-18: qty 4

## 2019-12-18 MED ORDER — ASPIRIN EC 81 MG PO TBEC: 81.0000 mg | DELAYED_RELEASE_TABLET | Freq: Every day | ORAL | 2 refills | Status: DC

## 2019-12-18 MED ORDER — DIPHENHYDRAMINE HCL 50 MG/ML IJ SOLN: 50.0000 mg | Freq: Once | INTRAMUSCULAR | Status: DC | PRN

## 2019-12-18 MED ORDER — SODIUM CHLORIDE 0.9 % IV SOLN
INTRAVENOUS | Status: DC
  Administered 2019-12-18: 1000 mL via INTRAVENOUS

## 2019-12-18 MED ORDER — MIDAZOLAM HCL 2 MG/ML PO SYRP: 8.0000 mg | ORAL_SOLUTION | Freq: Once | ORAL | Status: DC | PRN

## 2019-12-18 MED ORDER — ATORVASTATIN CALCIUM 10 MG PO TABS: 10.0000 mg | ORAL_TABLET | Freq: Every day | ORAL | 11 refills | Status: DC

## 2019-12-18 MED ORDER — HEPARIN SODIUM (PORCINE) 1000 UNIT/ML IJ SOLN
INTRAMUSCULAR | Status: DC | PRN
  Administered 2019-12-18: 4500 [IU] via INTRAVENOUS

## 2019-12-18 MED ORDER — CLOPIDOGREL BISULFATE 300 MG PO TABS: 300.0000 mg | ORAL_TABLET | Freq: Once | ORAL | Status: AC

## 2019-12-18 MED ORDER — CLINDAMYCIN PHOSPHATE 300 MG/50ML IV SOLN
300.0000 mg | Freq: Once | INTRAVENOUS | Status: AC
  Administered 2019-12-18: 300 mg via INTRAVENOUS

## 2019-12-18 MED ORDER — FENTANYL CITRATE (PF) 100 MCG/2ML IJ SOLN
INTRAMUSCULAR | Status: DC | PRN
  Administered 2019-12-18: 50 ug via INTRAVENOUS
  Administered 2019-12-18: 25 ug via INTRAVENOUS

## 2019-12-18 MED ORDER — CLOPIDOGREL BISULFATE 300 MG PO TABS: 300.0000 mg | ORAL_TABLET | Freq: Once | ORAL | 0 refills | Status: AC

## 2019-12-18 MED ORDER — MIDAZOLAM HCL 2 MG/2ML IJ SOLN
INTRAMUSCULAR | Status: DC | PRN
  Administered 2019-12-18: 2 mg via INTRAVENOUS
  Administered 2019-12-18: 1 mg via INTRAVENOUS

## 2019-12-18 MED ORDER — HYDROMORPHONE HCL 1 MG/ML IJ SOLN: 1.0000 mg | Freq: Once | INTRAMUSCULAR | Status: DC | PRN

## 2019-12-18 MED ORDER — CEFAZOLIN SODIUM-DEXTROSE 2-4 GM/100ML-% IV SOLN: 2.0000 g | Freq: Once | INTRAVENOUS | Status: DC

## 2019-12-18 MED ORDER — CLOPIDOGREL BISULFATE 75 MG PO TABS: 75.0000 mg | ORAL_TABLET | Freq: Every day | ORAL | 11 refills | Status: DC

## 2019-12-18 SURGICAL SUPPLY — 18 items
BALLN LUTONIX DCB 5X100X130 (BALLOONS) ×2
BALLN ULTRVRSE 018 2.5X150X150 (BALLOONS) ×2
BALLOON LUTONIX DCB 5X100X130 (BALLOONS) IMPLANT
BALLOON ULTRVS 018 2.5X150X150 (BALLOONS) IMPLANT
CATH BEACON 5 .038 100 VERT TP (CATHETERS) ×1 IMPLANT
CATH PIG 70CM (CATHETERS) ×1 IMPLANT
COVER PROBE U/S 5X48 (MISCELLANEOUS) ×1 IMPLANT
DEVICE PRESTO INFLATION (MISCELLANEOUS) ×1 IMPLANT
DEVICE STARCLOSE SE CLOSURE (Vascular Products) ×1 IMPLANT
GUIDEWIRE PFTE-COATED .018X300 (WIRE) ×1 IMPLANT
PACK ANGIOGRAPHY (CUSTOM PROCEDURE TRAY) ×2 IMPLANT
SHEATH ANL2 6FRX45 HC (SHEATH) ×1 IMPLANT
SHEATH BRITE TIP 5FRX11 (SHEATH) ×1 IMPLANT
STENT VIABAHN 6X7.5X120 (Permanent Stent) ×1 IMPLANT
SYR MEDRAD MARK 7 150ML (SYRINGE) ×1 IMPLANT
TUBING CONTRAST HIGH PRESS 72 (TUBING) ×1 IMPLANT
WIRE J 3MM .035X145CM (WIRE) ×1 IMPLANT
WIRE MAGIC TORQUE 260C (WIRE) ×1 IMPLANT

## 2019-12-18 NOTE — Discharge Instructions (Signed)
Femoral Site Care This sheet gives you information about how to care for yourself after your procedure. Your health care provider may also give you more specific instructions. If you have problems or questions, contact your health care provider. What can I expect after the procedure? After the procedure, it is common to have:  Bruising that usually fades within 1-2 weeks.  Tenderness at the site. Follow these instructions at home: Wound care  Follow instructions from your health care provider about how to take care of your insertion site. Make sure you: ? Wash your hands with soap and water before you change your bandage (dressing). If soap and water are not available, use hand sanitizer. ? Change your dressing as told by your health care provider. ? Leave stitches (sutures), skin glue, or adhesive strips in place. These skin closures may need to stay in place for 2 weeks or longer. If adhesive strip edges start to loosen and curl up, you may trim the loose edges. Do not remove adhesive strips completely unless your health care provider tells you to do that.  Do not take baths, swim, or use a hot tub until your health care provider approves.  You may shower 24-48 hours after the procedure or as told by your health care provider. ? Gently wash the site with plain soap and water. ? Pat the area dry with a clean towel. ? Do not rub the site. This may cause bleeding.  Do not apply powder or lotion to the site. Keep the site clean and dry.  Check your femoral site every day for signs of infection. Check for: ? Redness, swelling, or pain. ? Fluid or blood. ? Warmth. ? Pus or a bad smell. Activity  For the first 2-3 days after your procedure, or as long as directed: ? Avoid climbing stairs as much as possible. ? Do not squat.  Do not lift anything that is heavier than 10 lb (4.5 kg), or the limit that you are told, until your health care provider says that it is safe.  Rest as  directed. ? Avoid sitting for a long time without moving. Get up to take short walks every 1-2 hours.  Do not drive for 24 hours if you were given a medicine to help you relax (sedative). General instructions  Take over-the-counter and prescription medicines only as told by your health care provider.  Keep all follow-up visits as told by your health care provider. This is important. Contact a health care provider if you have:  A fever or chills.  You have redness, swelling, or pain around your insertion site. Get help right away if:  The catheter insertion area swells very fast.  You pass out.  You suddenly start to sweat or your skin gets clammy.  The catheter insertion area is bleeding, and the bleeding does not stop when you hold steady pressure on the area.  The area near or just beyond the catheter insertion site becomes pale, cool, tingly, or numb. These symptoms may represent a serious problem that is an emergency. Do not wait to see if the symptoms will go away. Get medical help right away. Call your local emergency services (911 in the [place] Do not drive yourself to the hospital. Summary  After the procedure, it is common to have bruising that usually fades within 1-2 weeks.  Check your femoral site every day for signs of infection.  Do not lift anything that is heavier than 10 lb (4.5 kg), or the   limit that you are told, until your health care provider says that it is safe. This information is not intended to replace advice given to you by your health care provider. Make sure you discuss any questions you have with your health care provider. Document Revised: 11/19/2017 Document Reviewed: 11/19/2017 Elsevier Patient Education  2020 [employer]. Moderate Conscious Sedation, Adult, Care After These instructions provide you with information about caring for yourself after your procedure. Your health care provider may also give you more specific instructions. Your  treatment has been planned according to current medical practices, but problems sometimes occur. Call your health care provider if you have any problems or questions after your procedure. What can I expect after the procedure? After your procedure, it is common:  To feel sleepy for several hours.  To feel clumsy and have poor balance for several hours.  To have poor judgment for several hours.  To vomit if you eat too soon. Follow these instructions at home: For at least 24 hours after the procedure:   Do not: ? Participate in activities where you could fall or become injured. ? Drive. ? Use heavy machinery. ? Drink alcohol. ? Take sleeping pills or medicines that cause drowsiness. ? Make important decisions or sign legal documents. ? Take care of children on your own.  Rest. Eating and drinking  Follow the diet recommended by your health care provider.  If you vomit: ? Drink water, juice, or soup when you can drink without vomiting. ? Make sure you have little or no nausea before eating solid foods. General instructions  Have a responsible adult stay with you until you are awake and alert.  Take over-the-counter and prescription medicines only as told by your health care provider.  If you smoke, do not smoke without supervision.  Keep all follow-up visits as told by your health care provider. This is important. Contact a health care provider if:  You keep feeling nauseous or you keep vomiting.  You feel light-headed.  You develop a rash.  You have a fever. Get help right away if:  You have trouble breathing. This information is not intended to replace advice given to you by your health care provider. Make sure you discuss any questions you have with your health care provider. Document Revised: 10/19/2017 Document Reviewed: 02/26/2016 Elsevier Patient Education  2020 [employer]. Angiogram, Care After This sheet gives you information about how to care for  yourself after your procedure. Your doctor may also give you more specific instructions. If you have problems or questions, contact your doctor. Follow these instructions at home: Insertion site care  Follow instructions from your doctor about how to take care of your long, thin tube (catheter) insertion area. Make sure you: ? Wash your hands with soap and water before you change your bandage (dressing). If you cannot use soap and water, use hand sanitizer. ? Change your bandage as told by your doctor. ? Leave stitches (sutures), skin glue, or skin tape (adhesive) strips in place. They may need to stay in place for 2 weeks or longer. If tape strips get loose and curl up, you may trim the loose edges. Do not remove tape strips completely unless your doctor says it is okay.  Do not take baths, swim, or use a hot tub until your doctor says it is okay.  You may shower 24-48 hours after the procedure or as told by your doctor. ? Gently wash the area with plain soap and water. ?   Pat the area dry with a clean towel. ? Do not rub the area. This may cause bleeding.  Do not apply powder or lotion to the area. Keep the area clean and dry.  Check your insertion area every day for signs of infection. Check for: ? More redness, swelling, or pain. ? Fluid or blood. ? Warmth. ? Pus or a bad smell. Activity  Rest as told by your doctor, usually for 1-2 days.  Do not lift anything that is heavier than 10 lbs. (4.5 kg) or as told by your doctor.  Do not drive for 24 hours if you were given a medicine to help you relax (sedative).  Do not drive or use heavy machinery while taking prescription pain medicine. General instructions   Go back to your normal activities as told by your doctor, usually in about a week. Ask your doctor what activities are safe for you.  If the insertion area starts to bleed, lie flat and put pressure on the area. If the bleeding does not stop, get help right away. This is an  emergency.  Drink enough fluid to keep your pee (urine) clear or pale yellow.  Take over-the-counter and prescription medicines only as told by your doctor.  Keep all follow-up visits as told by your doctor. This is important. Contact a doctor if:  You have a fever.  You have chills.  You have more redness, swelling, or pain around your insertion area.  You have fluid or blood coming from your insertion area.  The insertion area feels warm to the touch.  You have pus or a bad smell coming from your insertion area.  You have more bruising around the insertion area.  Blood collects in the tissue around the insertion area (hematoma) that may be painful to the touch. Get help right away if:  You have a lot of pain in the insertion area.  The insertion area swells very fast.  The insertion area is bleeding, and the bleeding does not stop after holding steady pressure on the area.  The area near or just beyond the insertion area becomes pale, cool, tingly, or numb. These symptoms may be an emergency. Do not wait to see if the symptoms will go away. Get medical help right away. Call your local emergency services (911 in the [place] Do not drive yourself to the hospital. Summary  After the procedure, it is common to have bruising and tenderness at the long, thin tube insertion area.  After the procedure, it is important to rest and drink plenty of fluids.  Do not take baths, swim, or use a hot tub until your doctor says it is okay to do so. You may shower 24-48 hours after the procedure or as told by your doctor.  If the insertion area starts to bleed, lie flat and put pressure on the area. If the bleeding does not stop, get help right away. This is an emergency. This information is not intended to replace advice given to you by your health care provider. Make sure you discuss any questions you have with your health care provider. Document Revised: 10/19/2017 Document Reviewed:  10/31/2016 Elsevier Patient Education  2020 [employer] Inc.  

## 2019-12-18 NOTE — H&P (Signed)
[facility] History & Physical Update  The patient was interviewed and re-examined.  The patient's previous History and Physical has been reviewed and is unchanged.  There is no change in the plan of care. We plan to proceed with the scheduled procedure.  Leotis Pain, MD  12/18/2019, 10:35 AM

## 2019-12-18 NOTE — Op Note (Signed)
[facility]  Percutaneous Study/Intervention Procedural Note   Date of Surgery: 12/18/2019  Surgeon(s):DEW,JASON    Assistants:none  Pre-operative Diagnosis: PAD with gangrene right foot  Post-operative diagnosis:  Same  Procedure(s) Performed:             1.  Ultrasound guidance for vascular access left femoral artery             2.  Catheter placement into right common femoral artery from left femoral approach             3.  Aortogram and selective right lower extremity angiogram             4.  Percutaneous transluminal angioplasty of right tibioperoneal trunk and proximal posterior tibial artery with 2.5 mm diameter angioplasty balloon             5.   Percutaneous transluminal angioplasty of the right above-knee popliteal artery with 5 mm diameter Lutonix drug-coated angioplasty balloon  6.  Viabahn stent placement for greater than 50% residual stenosis after angioplasty of the right above-knee popliteal artery using a 6 mm diameter by 7.5 cm length stent             7.  StarClose closure device left femoral artery  EBL: 3 cc  Contrast: 75 cc  Fluoro Time: 5.8 minutes  Moderate Conscious Sedation Time: approximately 30 minutes using 3 mg of Versed and 75 mcg of Fentanyl              Indications:  Patient is a 74 y.o.female with a gangrenous toe on her right foot. The patient has had a CT scan showing significant disease although this limited study is difficult to discern in its entirety. The patient is brought in for angiography for further evaluation and potential treatment.  Due to the limb threatening nature of the situation, angiogram was performed for attempted limb salvage. The patient is aware that if the procedure fails, amputation would be expected.  The patient also understands that even with successful revascularization, amputation may still be required due to the severity of the situation.  Risks and benefits are discussed and informed consent  is obtained.   Procedure:  The patient was identified and appropriate procedural time out was performed.  The patient was then placed supine on the table and prepped and draped in the usual sterile fashion. Moderate conscious sedation was administered during a face to face encounter with the patient throughout the procedure with my supervision of the RN administering medicines and monitoring the patient's vital signs, pulse oximetry, telemetry and mental status throughout from the start of the procedure until the patient was taken to the recovery room. Ultrasound was used to evaluate the left common femoral artery.  It was patent .  A digital ultrasound image was acquired.  A Seldinger needle was used to access the left common femoral artery under direct ultrasound guidance and a permanent image was performed.  A 0.035 J wire was advanced without resistance and a 5Fr sheath was placed.  Pigtail catheter was placed into the aorta and an AP aortogram was performed. This demonstrated normal renal arteries and normal aorta and iliac segments without significant stenosis. I then crossed the aortic bifurcation and advanced to the right femoral head. Selective right lower extremity angiogram was then performed. This demonstrated Common femoral artery, profunda femoris artery, and superficial femoral artery all were patent without hemodynamically significant stenosis.  95% stenosis of the above-knee popliteal artery  with some mild poststenotic dilatation.  The vessel then normalized down to the tibial trifurcation.  The anterior tibial artery was patent with good runoff to the foot.  The tibioperoneal trunk was occluded with reconstitution of both the peroneal and posterior tibial arteries beyond this occlusion with the posterior tibial artery then being continuous to the foot. It was felt that it was in the patient's best interest to proceed with intervention after these images to avoid a second procedure and a larger  amount of contrast and fluoroscopy based off of the findings from the initial angiogram. The patient was systemically heparinized and a 6 [place] Ansell sheath was then placed over the Magic torque wire. I then used a Kumpe catheter and the 0.018 advantage wire to navigate through the popliteal stenosis and then down to the tibioperoneal trunk occlusion.  I was able to navigate through this with minimal difficulty and parked the wire in the posterior tibial artery in the foot.  I then proceeded with treatment.  A 2.5 mm diameter by 15 cm length angioplasty balloon was then inflated in the tibioperoneal trunk in the proximal portion of the posterior tibial artery and taken up to 12 atm for 1 minute.  The popliteal lesion was then addressed with a 5 mm diameter by 10 cm length Lutonix drug-coated angioplasty balloon inflated to 8 atm for 1 minute.  Completion imaging showed the tibioperoneal trunk and proximal posterior tibial artery to be widely patent with less than 20% residual stenosis.  There is now inline flow through the peroneal artery as well.  There is two-vessel runoff distally into the foot now.  The popliteal artery had residual stenosis of greater than 50% and I elected to place a 6 mm diameter by 7.5 cm length Viabahn stent postdilated with a 5 mm balloon with excellent endograft completion result and less than 10% residual stenosis. I elected to terminate the procedure. The sheath was removed and StarClose closure device was deployed in the left femoral artery with excellent hemostatic result. The patient was taken to the recovery room in stable condition having tolerated the procedure well.  Findings:               Aortogram:  Renal arteries appear to be normal.  The aorta and iliac arteries had mild to moderate plaque but no hemodynamically significant stenosis.             Right lower Extremity:  Common femoral artery, profunda femoris artery, and superficial femoral artery all were patent  without hemodynamically significant stenosis.  95% stenosis of the above-knee popliteal artery with some mild poststenotic dilatation.  The vessel then normalized down to the tibial trifurcation.  The anterior tibial artery was patent with good runoff to the foot.  The tibioperoneal trunk was occluded with reconstitution of both the peroneal and posterior tibial arteries beyond this occlusion with the posterior tibial artery then being continuous to the foot.   Disposition: Patient was taken to the recovery room in stable condition having tolerated the procedure well.  Complications: None  [name] 12/18/2019 11:41 AM   This note was created with Dragon Medical transcription system. Any errors in dictation are purely unintentional.

## 2019-12-21 NOTE — Progress Notes (Signed)
Patient: [name]  Service Category: E/M  Provider: Gaspar Cola, MD  DOB: [birth year]  DOS: 12/22/2019  Location: Office  MRN: [id number]  Setting: Ambulatory outpatient  Referring Provider: Albina Billet, MD  Type: Established Patient  Specialty: Interventional Pain Management  PCP: Albina Billet, MD  Location: Remote location  Delivery: TeleHealth     Virtual Encounter - Pain Management PROVIDER NOTE: Information contained herein reflects review and annotations entered in association with encounter. Interpretation of such information and data should be left to medically-trained personnel. Information provided to patient can be located elsewhere in the medical record under "Patient Instructions". Document created using STT-dictation technology, any transcriptional errors that may result from process are unintentional.    Contact & Pharmacy Preferred: [place]: [phone] (home) Mobile: [phone] (mobile) E-mail: No e-mail address on record  [place], [place] - [address] [place] [address] Phone: [phone] Fax: [phone]   Pre-screening  [name] offered "in-person" vs "virtual" encounter. She indicated preferring virtual for this encounter.   Reason COVID-19*  Social distancing based on CDC and AMA recommendations.   I contacted [facility] on 12/22/2019 via telephone.      I clearly identified myself as Gaspar Cola, MD. I verified that I was speaking with the correct person using two identifiers (Name: [name], and date of birth: Mar 22, 1946).  Consent I sought verbal advanced consent from [name] for virtual visit interactions. I informed [name] of possible security and privacy concerns, risks, and limitations associated with providing "not-in-person" medical evaluation and management services. I also informed [name] of the availability of "in-person" appointments. Finally, I informed  her that there would be a charge for the virtual visit and that she could be  personally, fully or partially, financially responsible for it. [name] expressed understanding and agreed to proceed.   Historic Elements   [name] is a 74 y.o. year old, female patient evaluated today after her last encounter by our practice on Visit date not found. [name]  has a past medical history of Anxiety, CAD (coronary artery disease), Carpal tunnel syndrome, Cataract, Complication of anesthesia, COPD (chronic obstructive pulmonary disease) ([place]), CRP elevated (09/14/2015), Depression, Difficult intubation, Dyslipidemia, Dyspnea, Dysrhythmia, Elevated sedimentation rate (09/14/2015), Esophageal spasm, Gastrointestinal parasites, GERD (gastroesophageal reflux disease), Hiatal hernia, History of peptic ulcer disease, Hyperthyroidism, Hypothyroidism, Low magnesium levels (09/14/2015), Pelvic fracture ([place]) (2008), Reflux, Rotator cuff injury, and Stenosis, spinal, lumbar. She also  has a past surgical history that includes Cesarean section; Neck surgery; Cholecystectomy; Carpal tunnel release; Rotator cuff repair; Shoulder arthroscopy (12/07/2011); Shoulder surgery (12/07/2011); Intramedullary (im) nail intertrochanteric (Right, 10/01/2016); Fracture surgery; Appendectomy; Nose surgery; Back surgery; Cataract extraction w/PHACO (Right, 08/07/2017); Hip surgery; Cataract extraction w/PHACO (Left, 09/04/2017); Kyphoplasty (N/A, 08/20/2018); and Lower Extremity Angiography (Right, 12/18/2019). [name] has a current medication list which includes the following prescription(s): alprazolam, amlodipine, bupropion, escitalopram, ipratropium-albuterol, ipratropium-albuterol, levothyroxine, montelukast, [START ON 12/25/2019] oxycodone, [START ON 01/24/2020] oxycodone, [START ON 02/23/2020] oxycodone, tizanidine, trazodone, aspirin ec, atorvastatin, clopidogrel, and fluticasone-salmeterol. She  reports that she has been smoking  cigarettes. She has been smoking about 0.50 packs per day. She has never used smokeless tobacco. She reports that she does not drink alcohol or use drugs. [name] has No Known Allergies.   HPI  Today, she is being contacted for medication management. The patient indicates doing well with the current medication regimen. No adverse  reactions or side effects reported to the medications.  The patient indicates recently having had some right foot surgery due to the fact that her toes had turned black.  She was trying to have that go away by itself, but eventually her primary care physician saw it and sent her immediately for surgery.  She just had that surgery and apparently she has fully recovered. She indicates currently doing okay.  Pharmacotherapy Assessment  Analgesic: Oxycodone IR 5 mg, 1 tab PO q 6 hrs (20 mg/day of oxycodone) MME/day:30mg /day.   Monitoring: Pharmacotherapy: No side-effects or adverse reactions reported. [place] PMP: PDMP reviewed during this encounter.       Compliance: No problems identified. Effectiveness: Clinically acceptable. Plan: Refer to "POC".  UDS:  Summary  Date Value Ref Range Status  10/06/2019 Note  Final    Comment:    ==================================================================== [employer] Select 13 (MW) ==================================================================== Test                             Result       Flag       Units Drug Present and Declared for Prescription Verification   Alprazolam                     574          EXPECTED   ng/mg creat   Alpha-hydroxyalprazolam        >1587        EXPECTED   ng/mg creat    Source of alprazolam is a scheduled prescription medication. Alpha-    hydroxyalprazolam is an expected metabolite of alprazolam.   Oxycodone                      4542         EXPECTED   ng/mg creat   Oxymorphone                    3763         EXPECTED   ng/mg creat   Noroxycodone                   >7937        EXPECTED    ng/mg creat   Noroxymorphone                 548          EXPECTED   ng/mg creat    Sources of oxycodone are scheduled prescription medications.    Oxymorphone, noroxycodone, and noroxymorphone are expected    metabolites of oxycodone. Oxymorphone is also available as a    scheduled prescription medication. ==================================================================== Test                      Result    Flag   Units      Ref Range   Creatinine              126              mg/dL      >=20 ==================================================================== Declared Medications:  The flagging and interpretation on this report are based on the  following declared medications.  Unexpected results may arise from  inaccuracies in the declared medications.  **Note: The testing scope of this panel includes these medications:  Alprazolam  Oxycodone  **Note: The testing scope of this panel does not include the  following reported medications:  Albuterol (Combivent)  Albuterol (Duoneb)  Amlodipine  Bupropion  Escitalopram (Lexapro)  Fluticasone (Advair)  Ipratropium (Combivent)  Ipratropium (Duoneb)  Levothyroxine (Synthroid)  Magnesium  Montelukast (Singulair)  Salmeterol (Advair)  Tizanidine  Trazodone ==================================================================== For clinical consultation, please call [phone]. ====================================================================    Laboratory Chemistry Profile (12 mo)  Renal: 10/06/2019: BUN/Creatinine Ratio 9 12/18/2019: BUN 11; Creatinine, Ser 0.86  Lab Results  Component Value Date   GFRAA >60 12/18/2019   GFRNONAA >60 12/18/2019   Hepatic: 12/02/2019: Albumin 3.2 Lab Results  Component Value Date   AST 21 12/02/2019   ALT 57 (H) 12/02/2019   Other: 10/06/2019: 25-Hydroxy, Vitamin D 15; 25-Hydroxy, Vitamin D-2 <1.0; 25-Hydroxy, Vitamin D-3 14; CRP 5; Sed Rate 15; Vitamin B-12 331  Note: Above Lab  results reviewed.  Imaging  PERIPHERAL VASCULAR CATHETERIZATION See op note   Assessment  The primary encounter diagnosis was Chronic pain syndrome. Diagnoses of Chronic low back pain (Primary Area of Pain) (Right) and Chronic neck pain (Secondary area of Pain) (Bilateral) (R>L) were also pertinent to this visit.  Plan of Care  Problem-specific:  No problem-specific Assessment & Plan notes found for this encounter.  I have discontinued [name] magnesium oxide, ergocalciferol, Calcium Plus D3 Absorbable, and Magnesium. I am also having her start on oxyCODONE and oxyCODONE. Additionally, I am having her maintain her ALPRAZolam, buPROPion, escitalopram, levothyroxine, Ipratropium-Albuterol, traZODone, amLODipine, montelukast, ipratropium-albuterol, Fluticasone-Salmeterol, tizanidine, and oxyCODONE.  Pharmacotherapy (Medications Ordered): Meds ordered this encounter  Medications  . oxyCODONE (OXY IR/ROXICODONE) 5 MG immediate release tablet    Sig: Take 1 tablet (5 mg total) by mouth every 6 (six) hours as needed for severe pain. Must last 30 days    Dispense:  120 tablet    Refill:  0    Chronic Pain: STOP Act (Not applicable) Fill 1 day early if closed on refill date. Do not fill until: 12/25/2019. To last until: 01/24/2020. Avoid benzodiazepines within 8 hours of opioids  . oxyCODONE (OXY IR/ROXICODONE) 5 MG immediate release tablet    Sig: Take 1 tablet (5 mg total) by mouth every 6 (six) hours as needed for severe pain. Must last 30 days    Dispense:  120 tablet    Refill:  0    Chronic Pain: STOP Act (Not applicable) Fill 1 day early if closed on refill date. Do not fill until: 01/24/2020. To last until: 02/23/2020. Avoid benzodiazepines within 8 hours of opioids  . oxyCODONE (OXY IR/ROXICODONE) 5 MG immediate release tablet    Sig: Take 1 tablet (5 mg total) by mouth every 6 (six) hours as needed for severe pain. Must last 30 days    Dispense:  120 tablet    Refill:  0    Chronic  Pain: STOP Act (Not applicable) Fill 1 day early if closed on refill date. Do not fill until: 02/23/2020. To last until: 03/24/2020. Avoid benzodiazepines within 8 hours of opioids   Orders:  No orders of the defined types were placed in this encounter.  Follow-up plan:   Return in about 3 months (around 03/24/2020) for (VV), (MM).      Interventional management options: Considering:   Possible right SI joint RFA Possible right lumbar facet RFA Diagnostic bilateral cervical facetblock  Possible bilateral cervical facet RFA  Diagnostic right CESI  Diagnostic bilateral IA shoulder injection  Diagnostic bilateral suprascapularNB  Possible bilateral suprascapular nerve RFA  Diagnostic left L3-4 TFESI  Diagnostic L3-4 vs L4-5 LESI  Palliative PRN treatment(s):   Diagnostic rightlumbarfacet + right SI joint block#2 Palliative right SI joint block Palliative right lumbar facet block     Recent Visits Date Type Provider Dept  09/24/19 Telemedicine Milinda Pointer, MD [facility]  Showing recent visits within past 90 days and meeting all other requirements   Today's Visits Date Type Provider Dept  12/22/19 Telemedicine Milinda Pointer, MD [facility]  Showing today's visits and meeting all other requirements   Future Appointments No visits were found meeting these conditions.  Showing future appointments within next 90 days and meeting all other requirements   I discussed the assessment and treatment plan with the patient. The patient was provided an opportunity to ask questions and all were answered. The patient agreed with the plan and demonstrated an understanding of the instructions.  Patient advised to call back or seek an in-person evaluation if the symptoms or condition worsens.  Duration of encounter: 13 minutes.  Note by: Gaspar Cola, MD Date: 12/22/2019; Time: 1:09 PM

## 2019-12-22 ENCOUNTER — Telehealth (INDEPENDENT_AMBULATORY_CARE_PROVIDER_SITE_OTHER): Payer: Self-pay | Admitting: Vascular Surgery

## 2019-12-22 ENCOUNTER — Other Ambulatory Visit: Payer: Self-pay

## 2019-12-22 ENCOUNTER — Ambulatory Visit: Payer: Medicare HMO | Attending: Pain Medicine | Admitting: Pain Medicine

## 2019-12-22 DIAGNOSIS — M545 Low back pain, unspecified: Secondary | ICD-10-CM

## 2019-12-22 DIAGNOSIS — G894 Chronic pain syndrome: Secondary | ICD-10-CM

## 2019-12-22 DIAGNOSIS — M542 Cervicalgia: Secondary | ICD-10-CM

## 2019-12-22 DIAGNOSIS — G8929 Other chronic pain: Secondary | ICD-10-CM

## 2019-12-22 MED ORDER — OXYCODONE HCL 5 MG PO TABS: 5.0000 mg | ORAL_TABLET | Freq: Four times a day (QID) | ORAL | 0 refills | Status: DC | PRN

## 2019-12-22 NOTE — Telephone Encounter (Signed)
Pt cousin called in with questions: Pt won't let her change the dressing and is wondering is this harmful but she also has some questions about the dressing and after care. Please call.

## 2019-12-22 NOTE — Telephone Encounter (Signed)
I spoke with the patient cousin(Denise) and she had a question about should the patient keep a incision site covered with guaze. I informed the patient cousin Langley Gauss) with discharge summary instructions and she verbalized understanding.

## 2020-01-01 ENCOUNTER — Encounter (INDEPENDENT_AMBULATORY_CARE_PROVIDER_SITE_OTHER): Payer: Medicare HMO

## 2020-01-01 ENCOUNTER — Ambulatory Visit (INDEPENDENT_AMBULATORY_CARE_PROVIDER_SITE_OTHER): Payer: Medicare HMO | Admitting: Nurse Practitioner

## 2020-01-21 ENCOUNTER — Ambulatory Visit (INDEPENDENT_AMBULATORY_CARE_PROVIDER_SITE_OTHER): Payer: Medicare HMO | Admitting: Nurse Practitioner

## 2020-01-21 ENCOUNTER — Encounter (INDEPENDENT_AMBULATORY_CARE_PROVIDER_SITE_OTHER): Payer: Medicare HMO

## 2020-02-02 ENCOUNTER — Other Ambulatory Visit (INDEPENDENT_AMBULATORY_CARE_PROVIDER_SITE_OTHER): Payer: Self-pay | Admitting: Vascular Surgery

## 2020-02-02 DIAGNOSIS — Z95820 Peripheral vascular angioplasty status with implants and grafts: Secondary | ICD-10-CM

## 2020-02-04 ENCOUNTER — Encounter (INDEPENDENT_AMBULATORY_CARE_PROVIDER_SITE_OTHER): Payer: Medicare HMO

## 2020-02-04 ENCOUNTER — Ambulatory Visit (INDEPENDENT_AMBULATORY_CARE_PROVIDER_SITE_OTHER): Payer: Medicare HMO | Admitting: Nurse Practitioner

## 2020-02-11 ENCOUNTER — Ambulatory Visit (INDEPENDENT_AMBULATORY_CARE_PROVIDER_SITE_OTHER): Payer: Medicare HMO | Admitting: Nurse Practitioner

## 2020-02-11 ENCOUNTER — Encounter (INDEPENDENT_AMBULATORY_CARE_PROVIDER_SITE_OTHER): Payer: Medicare HMO

## 2020-02-18 ENCOUNTER — Ambulatory Visit (INDEPENDENT_AMBULATORY_CARE_PROVIDER_SITE_OTHER): Payer: Medicare HMO | Admitting: Nurse Practitioner

## 2020-02-18 ENCOUNTER — Encounter (INDEPENDENT_AMBULATORY_CARE_PROVIDER_SITE_OTHER): Payer: Medicare HMO

## 2020-03-03 ENCOUNTER — Encounter (INDEPENDENT_AMBULATORY_CARE_PROVIDER_SITE_OTHER): Payer: Medicare HMO

## 2020-03-03 ENCOUNTER — Ambulatory Visit (INDEPENDENT_AMBULATORY_CARE_PROVIDER_SITE_OTHER): Payer: Medicare HMO | Admitting: Nurse Practitioner

## 2020-03-16 NOTE — Progress Notes (Signed)
Patient: [name]  Service Category: E/M  Provider: Gaspar Cola, MD  DOB: 1946-08-05  DOS: 03/17/2020  Location: Office  MRN: [id number]  Setting: Ambulatory outpatient  Referring Provider: Albina Billet, MD  Type: Established Patient  Specialty: Interventional Pain Management  PCP: Albina Billet, MD  Location: Remote location  Delivery: TeleHealth     Virtual Encounter - Pain Management PROVIDER NOTE: Information contained herein reflects review and annotations entered in association with encounter. Interpretation of such information and data should be left to medically-trained personnel. Information provided to patient can be located elsewhere in the medical record under "Patient Instructions". Document created using STT-dictation technology, any transcriptional errors that may result from process are unintentional.    Contact & Pharmacy Preferred: [phone] Home: [phone] (home) Mobile: [phone] (mobile) E-mail: No e-mail address on record  [id number] [place] [address]: [phone]: [phone]  Pre-screening  Ms. Gillott offered "in-person" vs "virtual" encounter. She indicated preferring virtual for this encounter.   Reason COVID-19*  Social distancing based on CDC and AMA recommendations.   I contacted JWellstonon 03/17/2020 via telephone.      I clearly identified myself as FGaspar Cola MD. I verified that I was speaking with the correct person using two identifiers (Name: JKameela Leipold and date of birth: 630-Sep-1947.  Consent I sought verbal advanced consent from JAdria Dillfor virtual visit interactions. I informed Ms. PGaberof possible security and privacy concerns, risks, and limitations associated with providing "not-in-person" medical evaluation and management services. I also informed Ms. PSaneof the availability of "in-person" appointments. Finally, I informed her that there  would be a charge for the virtual visit and that she could be  personally, fully or partially, financially responsible for it. Ms. PElenesexpressed understanding and agreed to proceed.   Historic Elements   Ms. JAlex Mcmanigalis a 74y.o. year old, female patient evaluated today after her last contact with our practice on Visit date not found. Ms. PFessenden has a past medical history of Anxiety, CAD (coronary artery disease), Carpal tunnel syndrome, Cataract, Complication of anesthesia, COPD [place]chronic obstructive pulmonary disease) ([place], CRP elevated (09/14/2015), Depression, Difficult intubation, Dyslipidemia, Dyspnea, Dysrhythmia, Elevated sedimentation rate (09/14/2015), Esophageal spasm, Gastrointestinal parasites, GERD (gastroesophageal reflux disease), Hiatal hernia, History of peptic ulcer disease, Hyperthyroidism, Hypothyroidism, Low magnesium levels (09/14/2015), Pelvic fracture ([place] ([place]), Reflux, Rotator cuff injury, and Stenosis, spinal, lumbar. She also  has a past surgical history that includes Cesarean section; Neck surgery; Cholecystectomy; Carpal tunnel release; Rotator cuff repair; Shoulder arthroscopy (12/07/2011); Shoulder surgery (12/07/2011); Intramedullary (im) nail intertrochanteric (Right, 10/01/2016); Fracture surgery; Appendectomy; Nose surgery; Back surgery; Cataract extraction w/PHACO (Right, 08/07/2017); Hip surgery; Cataract extraction w/PHACO (Left, 09/04/2017); Kyphoplasty (N/A, 08/20/2018); and Lower Extremity Angiography (Right, 12/18/2019). Ms. PKasalhas a current medication list which includes the following prescription(s): alprazolam, amlodipine, aspirin ec, atorvastatin, bupropion, clopidogrel, escitalopram, fluticasone-salmeterol, ipratropium-albuterol, ipratropium-albuterol, levothyroxine, montelukast, [START ON 03/24/2020] oxycodone, [START ON 04/23/2020] oxycodone, [START ON 05/23/2020] oxycodone, [START ON 03/24/2020] tizanidine, and trazodone. She  reports that she has been smoking  cigarettes. She has been smoking about 0.50 packs per day. She has never used smokeless tobacco. She reports that she does not drink alcohol or use drugs. Ms. PClemencehas No Known Allergies.   HPI  Today, she is being contacted for medication management.  The patient indicates doing well with the  current medication regimen. No adverse reactions or side effects reported to the medications.  Today the patient has requested to switch her medicines to a pharmacy closer to her in [place].  She has chosen [facility].  Pharmacotherapy Assessment  Analgesic: Oxycodone IR 5 mg, 1 tab PO q 6 hrs (20 mg/day of oxycodone) MME/day:41m/day.   Monitoring: [place] PMP: PDMP reviewed during this encounter.       Pharmacotherapy: No side-effects or adverse reactions reported. Compliance: No problems identified. Effectiveness: Clinically acceptable. Plan: Refer to "POC".  UDS:  Summary  Date Value Ref Range Status  10/06/2019 Note  Final    Comment:    ==================================================================== [employer] [facility] 13 (MW) ==================================================================== Test                             Result       Flag       Units Drug Present and Declared for Prescription Verification   Alprazolam                     574          EXPECTED   ng/mg creat   Alpha-hydroxyalprazolam        >1587        EXPECTED   ng/mg creat    Source of alprazolam is a scheduled prescription medication. Alpha-    hydroxyalprazolam is an expected metabolite of alprazolam.   Oxycodone                      4542         EXPECTED   ng/mg creat   Oxymorphone                    3763         EXPECTED   ng/mg creat   Noroxycodone                   >7937        EXPECTED   ng/mg creat   Noroxymorphone                 548          EXPECTED   ng/mg creat    Sources of oxycodone are scheduled prescription medications.    Oxymorphone, noroxycodone, and noroxymorphone are expected     metabolites of oxycodone. Oxymorphone is also available as a    scheduled prescription medication. ==================================================================== Test                      Result    Flag   Units      Ref Range   Creatinine              126              mg/dL      >=20 ==================================================================== Declared Medications:  The flagging and interpretation on this report are based on the  following declared medications.  Unexpected results may arise from  inaccuracies in the declared medications.  **Note: The testing scope of this panel includes these medications:  Alprazolam  Oxycodone  **Note: The testing scope of this panel does not include the  following reported medications:  Albuterol (Combivent)  Albuterol (Duoneb)  Amlodipine  Bupropion  Escitalopram (Lexapro)  Fluticasone (Advair)  Ipratropium (Combivent)  Ipratropium (Duoneb)  Levothyroxine (Synthroid)  Magnesium  Montelukast (  Singulair)  Salmeterol (Advair)  Tizanidine  Trazodone ==================================================================== For clinical consultation, please call [phone]. ====================================================================    Laboratory Chemistry Profile   Renal Lab Results  Component Value Date   BUN 11 12/18/2019   CREATININE 0.86 12/18/2019   BCR 9 (L) 10/06/2019   GFRAA >60 12/18/2019   GFRNONAA >60 12/18/2019     Hepatic Lab Results  Component Value Date   AST 21 12/02/2019   ALT 57 (H) 12/02/2019   ALBUMIN 3.2 (L) 12/02/2019   ALKPHOS 148 (H) 12/02/2019     Electrolytes Lab Results  Component Value Date   NA 138 12/02/2019   K 3.3 (L) 12/02/2019   CL 100 12/02/2019   CALCIUM 8.7 (L) 12/02/2019   MG 1.5 (L) 10/06/2019   PHOS 3.4 08/20/2018     Bone Lab Results  Component Value Date   25OHVITD1 15 (L) 10/06/2019   25OHVITD2 <1.0 10/06/2019   25OHVITD3 14 10/06/2019      Inflammation (CRP: Acute Phase) (ESR: Chronic Phase) Lab Results  Component Value Date   CRP 5 10/06/2019   ESRSEDRATE 15 10/06/2019   LATICACIDVEN 1.2 12/02/2019       Note: Above Lab results reviewed.  Imaging  PERIPHERAL VASCULAR CATHETERIZATION See op note  Assessment  The primary encounter diagnosis was Chronic pain syndrome. Diagnoses of Chronic low back pain (Primary Area of Pain) (Right), Chronic neck pain (Secondary area of Pain) (Bilateral) (R>L), and Myofascial pain syndrome, cervical were also pertinent to this visit.  Plan of Care  Problem-specific:  No problem-specific Assessment & Plan notes found for this encounter.  Ms. Sheree Lalla has a current medication list which includes the following long-term medication(s): amlodipine, atorvastatin, bupropion, escitalopram, fluticasone-salmeterol, ipratropium-albuterol, ipratropium-albuterol, levothyroxine, [START ON 03/24/2020] oxycodone, [START ON 04/23/2020] oxycodone, [START ON 05/23/2020] oxycodone, [START ON 03/24/2020] tizanidine, and trazodone.  Pharmacotherapy (Medications Ordered): Meds ordered this encounter  Medications  . oxyCODONE (OXY IR/ROXICODONE) 5 MG immediate release tablet    Sig: Take 1 tablet (5 mg total) by mouth every 6 (six) hours as needed for severe pain. Must last 30 days    Dispense:  120 tablet    Refill:  0    Chronic Pain: STOP Act (Not applicable) Fill 1 day early if closed on refill date. Do not fill until: 03/24/2020. To last until: 04/23/2020. Avoid benzodiazepines within 8 hours of opioids  . oxyCODONE (OXY IR/ROXICODONE) 5 MG immediate release tablet    Sig: Take 1 tablet (5 mg total) by mouth every 6 (six) hours as needed for severe pain. Must last 30 days    Dispense:  120 tablet    Refill:  0    Chronic Pain: STOP Act (Not applicable) Fill 1 day early if closed on refill date. Do not fill until: 04/23/2020. To last until: 05/23/2020. Avoid benzodiazepines within 8 hours of opioids  . oxyCODONE  (OXY IR/ROXICODONE) 5 MG immediate release tablet    Sig: Take 1 tablet (5 mg total) by mouth every 6 (six) hours as needed for severe pain. Must last 30 days    Dispense:  120 tablet    Refill:  0    Chronic Pain: STOP Act (Not applicable) Fill 1 day early if closed on refill date. Do not fill until: 05/23/2020. To last until: 06/22/2020. Avoid benzodiazepines within 8 hours of opioids  . tizanidine (ZANAFLEX) 2 MG capsule    Sig: Take 1 capsule (2 mg total) by mouth 3 (three) times daily as needed for  muscle spasms.    Dispense:  90 capsule    Refill:  5    Fill one day early if pharmacy is closed on scheduled refill date. May substitute for generic if available.   Orders:  No orders of the defined types were placed in this encounter.  Follow-up plan:   Return in about 3 months (around 06/21/2020) for (F2F), (MM).      Interventional management options: Considering:   Possible right SI joint RFA Possible right lumbar facet RFA Diagnostic bilateral cervical facetblock  Possible bilateral cervical facet RFA  Diagnostic right CESI  Diagnostic bilateral IA shoulder injection  Diagnostic bilateral suprascapularNB  Possible bilateral suprascapular nerve RFA  Diagnostic left L3-4 TFESI  Diagnostic L3-4 vs L4-5 LESI    Palliative PRN treatment(s):   Diagnostic rightlumbarfacet + right SI joint block#2 Palliative right SI joint block Palliative right lumbar facet block      Recent Visits Date Type Provider Dept  12/22/19 Telemedicine Milinda Pointer, MD [facility]  Showing recent visits within past 90 days and meeting all other requirements   Today's Visits Date Type Provider Dept  03/17/20 Telemedicine Milinda Pointer, MD [facility]  Showing today's visits and meeting all other requirements   Future Appointments No visits were found meeting these conditions.  Showing future appointments within next 90 days and meeting all other requirements   I  discussed the assessment and treatment plan with the patient. The patient was provided an opportunity to ask questions and all were answered. The patient agreed with the plan and demonstrated an understanding of the instructions.  Patient advised to call back or seek an in-person evaluation if the symptoms or condition worsens.  Duration of encounter: 13 minutes.  Note by: Gaspar Cola, MD Date: 03/17/2020; Time: 12:25 PM

## 2020-03-17 ENCOUNTER — Other Ambulatory Visit: Payer: Self-pay

## 2020-03-17 ENCOUNTER — Ambulatory Visit: Payer: Medicare HMO | Attending: Pain Medicine | Admitting: Pain Medicine

## 2020-03-17 DIAGNOSIS — M7918 Myalgia, other site: Secondary | ICD-10-CM

## 2020-03-17 DIAGNOSIS — M542 Cervicalgia: Secondary | ICD-10-CM

## 2020-03-17 DIAGNOSIS — G8929 Other chronic pain: Secondary | ICD-10-CM

## 2020-03-17 DIAGNOSIS — M545 Low back pain: Secondary | ICD-10-CM | POA: Diagnosis not present

## 2020-03-17 DIAGNOSIS — G894 Chronic pain syndrome: Secondary | ICD-10-CM | POA: Diagnosis not present

## 2020-03-17 MED ORDER — OXYCODONE HCL 5 MG PO TABS: 5.0000 mg | ORAL_TABLET | Freq: Four times a day (QID) | ORAL | 0 refills | Status: DC | PRN

## 2020-03-17 MED ORDER — TIZANIDINE HCL 2 MG PO CAPS: 2.0000 mg | ORAL_CAPSULE | Freq: Three times a day (TID) | ORAL | 5 refills | Status: DC | PRN

## 2020-06-15 NOTE — Progress Notes (Deleted)
Canceled by patient

## 2020-06-16 ENCOUNTER — Encounter: Payer: Medicare HMO | Admitting: Pain Medicine

## 2020-06-20 NOTE — Progress Notes (Signed)
No show

## 2020-06-21 ENCOUNTER — Encounter: Payer: Medicare HMO | Admitting: Pain Medicine

## 2020-06-22 ENCOUNTER — Encounter: Payer: Medicare HMO | Admitting: Pain Medicine

## 2020-07-04 NOTE — Progress Notes (Signed)
This patient's chart is under "My Open Charts". These are cancelled appointments that keep popping into my "In Basket" as a deficiency. See what you can do to remove them.   Thank you. 

## 2020-07-14 ENCOUNTER — Encounter: Payer: Medicare HMO | Admitting: Student in an Organized Health Care Education/Training Program

## 2020-07-19 ENCOUNTER — Encounter: Payer: Medicare HMO | Admitting: Pain Medicine

## 2020-08-02 ENCOUNTER — Other Ambulatory Visit: Payer: Self-pay | Admitting: Pain Medicine

## 2020-08-04 ENCOUNTER — Other Ambulatory Visit: Payer: Self-pay | Admitting: Pain Medicine

## 2020-08-04 DIAGNOSIS — M7918 Myalgia, other site: Secondary | ICD-10-CM

## 2020-08-19 ENCOUNTER — Encounter: Payer: Medicare HMO | Admitting: Student in an Organized Health Care Education/Training Program

## 2020-09-16 ENCOUNTER — Other Ambulatory Visit: Payer: Self-pay | Admitting: Pain Medicine

## 2020-09-16 DIAGNOSIS — M7918 Myalgia, other site: Secondary | ICD-10-CM

## 2020-10-03 ENCOUNTER — Other Ambulatory Visit: Payer: Self-pay | Admitting: Pain Medicine

## 2020-10-31 NOTE — Progress Notes (Deleted)
No show

## 2020-11-03 ENCOUNTER — Other Ambulatory Visit: Payer: Self-pay | Admitting: Pain Medicine

## 2020-11-03 ENCOUNTER — Encounter: Payer: Medicare HMO | Admitting: Pain Medicine

## 2020-11-03 DIAGNOSIS — M7918 Myalgia, other site: Secondary | ICD-10-CM

## 2020-11-03 MED ORDER — TIZANIDINE HCL 2 MG PO CAPS: 2.0000 mg | ORAL_CAPSULE | Freq: Three times a day (TID) | ORAL | 2 refills | Status: DC | PRN

## 2020-11-11 ENCOUNTER — Encounter: Payer: Self-pay | Admitting: Pain Medicine

## 2020-11-11 ENCOUNTER — Ambulatory Visit: Payer: Medicare HMO | Attending: Pain Medicine | Admitting: Pain Medicine

## 2020-11-11 ENCOUNTER — Other Ambulatory Visit: Payer: Self-pay

## 2020-11-11 VITALS — BP 113/62 | HR 137 | Temp 96.6°F | Resp 18 | Ht 64.0 in | Wt 130.0 lb

## 2020-11-11 DIAGNOSIS — Z79899 Other long term (current) drug therapy: Secondary | ICD-10-CM | POA: Insufficient documentation

## 2020-11-11 DIAGNOSIS — G894 Chronic pain syndrome: Secondary | ICD-10-CM | POA: Diagnosis not present

## 2020-11-11 DIAGNOSIS — M25511 Pain in right shoulder: Secondary | ICD-10-CM | POA: Diagnosis present

## 2020-11-11 DIAGNOSIS — M545 Low back pain, unspecified: Secondary | ICD-10-CM | POA: Diagnosis present

## 2020-11-11 DIAGNOSIS — F1120 Opioid dependence, uncomplicated: Secondary | ICD-10-CM | POA: Diagnosis present

## 2020-11-11 DIAGNOSIS — M961 Postlaminectomy syndrome, not elsewhere classified: Secondary | ICD-10-CM | POA: Diagnosis present

## 2020-11-11 DIAGNOSIS — H9193 Unspecified hearing loss, bilateral: Secondary | ICD-10-CM

## 2020-11-11 DIAGNOSIS — M542 Cervicalgia: Secondary | ICD-10-CM | POA: Diagnosis not present

## 2020-11-11 DIAGNOSIS — G8929 Other chronic pain: Secondary | ICD-10-CM | POA: Diagnosis present

## 2020-11-11 DIAGNOSIS — M79671 Pain in right foot: Secondary | ICD-10-CM | POA: Diagnosis present

## 2020-11-11 DIAGNOSIS — Z79891 Long term (current) use of opiate analgesic: Secondary | ICD-10-CM | POA: Diagnosis present

## 2020-11-11 DIAGNOSIS — M25512 Pain in left shoulder: Secondary | ICD-10-CM | POA: Diagnosis present

## 2020-11-11 DIAGNOSIS — I96 Gangrene, not elsewhere classified: Secondary | ICD-10-CM | POA: Diagnosis present

## 2020-11-11 DIAGNOSIS — H9190 Unspecified hearing loss, unspecified ear: Secondary | ICD-10-CM | POA: Insufficient documentation

## 2020-11-11 MED ORDER — OXYCODONE HCL 5 MG PO TABS: 5.0000 mg | ORAL_TABLET | Freq: Two times a day (BID) | ORAL | 0 refills | Status: DC | PRN

## 2020-11-11 NOTE — Patient Instructions (Signed)
____________________________________________________________________________________________  Medication Rules  Purpose: To inform patients, and their family members, of our rules and regulations.  Applies to: All patients receiving prescriptions (written or electronic).  Pharmacy of record: Pharmacy where electronic prescriptions will be sent. If written prescriptions are taken to a different pharmacy, please inform the nursing staff. The pharmacy listed in the electronic medical record should be the one where you would like electronic prescriptions to be sent.  Electronic prescriptions: In compliance with the [place] Strengthen Opioid Misuse Prevention (STOP) Act of 2017 (Session Law 2017-74/H243), effective November 20, 2018, all controlled substances must be electronically prescribed. Calling prescriptions to the pharmacy will cease to exist.  Prescription refills: Only during scheduled appointments. Applies to all prescriptions.  NOTE: The following applies primarily to controlled substances (Opioid* Pain Medications).   Type of encounter (visit): For patients receiving controlled substances, face-to-face visits are required. (Not an option or up to the patient.)  Patient's responsibilities: 1. Pain Pills: Bring all pain pills to every appointment (except for procedure appointments). 2. Pill Bottles: Bring pills in original pharmacy bottle. Always bring the newest bottle. Bring bottle, even if empty. 3. Medication refills: You are responsible for knowing and keeping track of what medications you take and those you need refilled. The day before your appointment: write a list of all prescriptions that need to be refilled. The day of the appointment: give the list to the admitting nurse. Prescriptions will be written only during appointments. No prescriptions will be written on procedure days. If you forget a medication: it will not be "Called in", "Faxed", or "electronically sent".  You will need to get another appointment to get these prescribed. No early refills. Do not call asking to have your prescription filled early. 4. Prescription Accuracy: You are responsible for carefully inspecting your prescriptions before leaving our office. Have the discharge nurse carefully go over each prescription with you, before taking them home. Make sure that your name is accurately spelled, that your address is correct. Check the name and dose of your medication to make sure it is accurate. Check the number of pills, and the written instructions to make sure they are clear and accurate. Make sure that you are given enough medication to last until your next medication refill appointment. 5. Taking Medication: Take medication as prescribed. When it comes to controlled substances, taking less pills or less frequently than prescribed is permitted and encouraged. Never take more pills than instructed. Never take medication more frequently than prescribed.  6. Inform other Doctors: Always inform, all of your healthcare providers, of all the medications you take. 7. Pain Medication from other Providers: You are not allowed to accept any additional pain medication from any other Doctor or Healthcare provider. There are two exceptions to this rule. (see below) In the event that you require additional pain medication, you are responsible for notifying us, as stated below. 8. Cough Medicine: Often these contain an opioid, such as codeine or hydrocodone. Never accept or take cough medicine containing these opioids if you are already taking an opioid* medication. The combination may cause respiratory failure and death. 9. Medication Agreement: You are responsible for carefully reading and following our Medication Agreement. This must be signed before receiving any prescriptions from our practice. Safely store a copy of your signed Agreement. Violations to the Agreement will result in no further prescriptions.  (Additional copies of our Medication Agreement are available upon request.) 10. Laws, Rules, & Regulations: All patients are expected to follow all   Federal and State Laws, Statutes, Rules, & Regulations. Ignorance of the Laws does not constitute a valid excuse.  11. Illegal drugs and Controlled Substances: The use of illegal substances (including, but not limited to marijuana and its derivatives) and/or the illegal use of any controlled substances is strictly prohibited. Violation of this rule may result in the immediate and permanent discontinuation of any and all prescriptions being written by our practice. The use of any illegal substances is prohibited. 12. Adopted CDC guidelines & recommendations: Target dosing levels will be at or below 60 MME/day. Use of benzodiazepines** is not recommended.  Exceptions: There are only two exceptions to the rule of not receiving pain medications from other Healthcare Providers. 1. Exception #1 (Emergencies): In the event of an emergency (i.e.: accident requiring emergency care), you are allowed to receive additional pain medication. However, you are responsible for: As soon as you are able, call our office [phone], at any time of the day or night, and leave a message stating your name, the date and nature of the emergency, and the name and dose of the medication prescribed. In the event that your call is answered by a member of our staff, make sure to document and save the date, time, and the name of the person that took your information.  2. Exception #2 (Planned Surgery): In the event that you are scheduled by another doctor or dentist to have any type of surgery or procedure, you are allowed (for a period no longer than 30 days), to receive additional pain medication, for the acute post-op pain. However, in this case, you are responsible for picking up a copy of our "Post-op Pain Management for Surgeons" handout, and giving it to your surgeon or dentist. This  document is available at our office, and does not require an appointment to obtain it. Simply go to our office during business hours (Monday-Thursday from 8:00 AM to 4:00 PM) (Friday 8:00 AM to 12:00 Noon) or if you have a scheduled appointment with us, prior to your surgery, and ask for it by name. In addition, you are responsible for: calling our office [phone], at any time of the day or night, and leaving a message stating your name, name of your surgeon, type of surgery, and date of procedure or surgery. Failure to comply with your responsibilities may result in termination of therapy involving the controlled substances.  *Opioid medications include: morphine, codeine, oxycodone, oxymorphone, hydrocodone, hydromorphone, meperidine, tramadol, tapentadol, buprenorphine, fentanyl, methadone. **Benzodiazepine medications include: diazepam (Valium), alprazolam (Xanax), clonazepam (Klonopine), lorazepam (Ativan), clorazepate (Tranxene), chlordiazepoxide (Librium), estazolam (Prosom), oxazepam (Serax), temazepam (Restoril), triazolam (Halcion) (Last updated: 10/18/2020) ____________________________________________________________________________________________   ____________________________________________________________________________________________  Medication Recommendations and Reminders  Applies to: All patients receiving prescriptions (written and/or electronic).  Medication Rules & Regulations: These rules and regulations exist for your safety and that of others. They are not flexible and neither are we. Dismissing or ignoring them will be considered "non-compliance" with medication therapy, resulting in complete and irreversible termination of such therapy. (See document titled "Medication Rules" for more details.) In all conscience, because of safety reasons, we cannot continue providing a therapy where the patient does not follow instructions.  Pharmacy of record:   Definition:  This is the pharmacy where your electronic prescriptions will be sent.   We do not endorse any particular pharmacy, however, we have experienced problems with [facility] not securing enough medication supply for the community.  We do not restrict you in your choice of pharmacy. However,   once we write for your prescriptions, we will NOT be re-sending more prescriptions to fix restricted supply problems created by your pharmacy, or your insurance.   The pharmacy listed in the electronic medical record should be the one where you want electronic prescriptions to be sent.  If you choose to change pharmacy, simply notify our nursing staff.  Recommendations:  Keep all of your pain medications in a safe place, under lock and key, even if you live alone. We will NOT replace lost, stolen, or damaged medication.  After you fill your prescription, take 1 week's worth of pills and put them away in a safe place. You should keep a separate, properly labeled bottle for this purpose. The remainder should be kept in the original bottle. Use this as your primary supply, until it runs out. Once it's gone, then you know that you have 1 week's worth of medicine, and it is time to come in for a prescription refill. If you do this correctly, it is unlikely that you will ever run out of medicine.  To make sure that the above recommendation works, it is very important that you make sure your medication refill appointments are scheduled at least 1 week before you run out of medicine. To do this in an effective manner, make sure that you do not leave the office without scheduling your next medication management appointment. Always ask the nursing staff to show you in your prescription , when your medication will be running out. Then arrange for the receptionist to get you a return appointment, at least 7 days before you run out of medicine. Do not wait until you have 1 or 2 pills left, to come in. This is very poor planning and  does not take into consideration that we may need to cancel appointments due to bad weather, sickness, or emergencies affecting our staff.  DO NOT ACCEPT A "Partial Fill": If for any reason your pharmacy does not have enough pills/tablets to completely fill or refill your prescription, do not allow for a "partial fill". The law allows the pharmacy to complete that prescription within 72 hours, without requiring a new prescription. If they do not fill the rest of your prescription within those 72 hours, you will need a separate prescription to fill the remaining amount, which we will NOT provide. If the reason for the partial fill is your insurance, you will need to talk to the pharmacist about payment alternatives for the remaining tablets, but again, DO NOT ACCEPT A PARTIAL FILL, unless you can trust your pharmacist to obtain the remainder of the pills within 72 hours.  Prescription refills and/or changes in medication(s):   Prescription refills, and/or changes in dose or medication, will be conducted only during scheduled medication management appointments. (Applies to both, written and electronic prescriptions.)  No refills on procedure days. No medication will be changed or started on procedure days. No changes, adjustments, and/or refills will be conducted on a procedure day. Doing so will interfere with the diagnostic portion of the procedure.  No phone refills. No medications will be "called into the pharmacy".  No Fax refills.  No weekend refills.  No Holliday refills.  No after hours refills.  Remember:  Business hours are:  Monday to Thursday 8:00 AM to 4:00 PM Provider's Schedule: Kaeo Jacome, MD - Appointments are:  Medication management: Monday and Wednesday 8:00 AM to 4:00 PM Procedure day: Tuesday and Thursday 7:30 AM to 4:00 PM Bilal Lateef, MD - Appointments are:    Medication management: Tuesday and Thursday 8:00 AM to 4:00 PM Procedure day: Monday and Wednesday  7:30 AM to 4:00 PM (Last update: 06/09/2020) ____________________________________________________________________________________________   ____________________________________________________________________________________________  CBD (cannabidiol) WARNING  Applicable to: All individuals currently taking or considering taking CBD (cannabidiol) and, more important, all patients taking opioid analgesic controlled substances (pain medication). (Example: oxycodone; oxymorphone; hydrocodone; hydromorphone; morphine; methadone; tramadol; tapentadol; fentanyl; buprenorphine; butorphanol; dextromethorphan; meperidine; codeine; etc.)  Legal status: CBD remains a Schedule I drug prohibited for any use. CBD is illegal with one exception. In the [place], CBD has a limited Food and Drug Administration (FDA) approval for the treatment of two specific types of epilepsy disorders. Only one CBD product has been approved by the FDA for this purpose: "Epidiolex". FDA is aware that some companies are marketing products containing cannabis and cannabis-derived compounds in ways that violate the Federal Food, Drug and Cosmetic Act (FD&C Act) and that may put the health and safety of consumers at risk. The FDA, a Federal agency, has not enforced the CBD status since 2018.   Legality: Some manufacturers ship CBD products nationally, which is illegal. Often such products are sold online and are therefore available throughout the country. CBD is openly sold in head shops and health food stores in some states where such sales have not been explicitly legalized. Selling unapproved products with unsubstantiated therapeutic claims is not only a violation of the law, but also can put patients at risk, as these products have not been proven to be safe or effective. Federal illegality makes it difficult to conduct research on CBD.  Reference: "FDA Regulation of Cannabis and Cannabis-Derived Products, Including Cannabidiol  (CBD)" - https://www.fda.gov/news-events/public-health-focus/fda-regulation-cannabis-and-cannabis-derived-products-including-cannabidiol-cbd  Warning: CBD is not FDA approved and has not undergo the same manufacturing controls as prescription drugs.  This means that the purity and safety of available CBD may be questionable. Most of the time, despite manufacturer's claims, it is contaminated with THC (delta-9-tetrahydrocannabinol - the chemical in marijuana responsible for the "HIGH").  When this is the case, the THC contaminant will trigger a positive urine drug screen (UDS) test for Marijuana (carboxy-THC). Because a positive UDS for any illicit substance is a violation of our medication agreement, your opioid analgesics (pain medicine) may be permanently discontinued.  MORE ABOUT CBD  General Information: CBD  is a derivative of the Marijuana (cannabis sativa) plant discovered in 1940. It is one of the 113 identified substances found in Marijuana. It accounts for up to 40% of the plant's extract. As of 2018, preliminary clinical studies on CBD included research for the treatment of anxiety, movement disorders, and pain. CBD is available and consumed in multiple forms, including inhalation of smoke or vapor, as an aerosol spray, and by mouth. It may be supplied as an oil containing CBD, capsules, dried cannabis, or as a liquid solution. CBD is thought not to be as psychoactive as THC (delta-9-tetrahydrocannabinol - the chemical in marijuana responsible for the "HIGH"). Studies suggest that CBD may interact with different biological target receptors in the body, including cannabinoid and other neurotransmitter receptors. As of 2018 the mechanism of action for its biological effects has not been determined.  Side-effects  Adverse reactions: Dry mouth, diarrhea, decreased appetite, fatigue, drowsiness, malaise, weakness, sleep disturbances, and others.  Drug interactions: CBC may interact with other  medications such as blood-thinners. (Last update: 06/26/2020) ____________________________________________________________________________________________    

## 2020-11-11 NOTE — Progress Notes (Signed)
PROVIDER NOTE: Information contained herein reflects review and annotations entered in association with encounter. Interpretation of such information and data should be left to medically-trained personnel. Information provided to patient can be located elsewhere in the medical record under "Patient Instructions". Document created using STT-dictation technology, any transcriptional errors that may result from process are unintentional.    Patient: [name]  Service Category: E/M  Provider: Gaspar Cola, MD  DOB: 06/29/1946  DOS: 11/11/2020  Specialty: Interventional Pain Management  MRN: [id number]  Setting: Ambulatory outpatient  PCP: Albina Billet, MD  Type: Established Patient    Referring Provider: Albina Billet, MD  Location: Office  Delivery: Face-to-face     HPI  [name], a 74 y.o. year old female, is here today because of her Chronic pain syndrome [G89.4]. [name] primary complain today is Back Pain (low), Neck Pain, Shoulder Pain, and Hip Pain Last encounter: My last encounter with her was on 11/03/2020. Pertinent problems: 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 Pain Assessment: Severity of Chronic pain is reported as a 7 /10. Location: Back Lower/radiates down right leg to knee. Onset: More than a month ago. Quality: Aching,Sharp,Throbbing,Shooting. Timing: Constant. Modifying factor(s): nothing. Vitals:  height is 5' 4"  (1.626 m) and weight is 130 lb (59 kg). Her temperature is 96.6 F (35.9 C) (abnormal). Her blood pressure is 113/62 and her pulse is 137 (abnormal). Her respiration is 18 and oxygen saturation is 96%.   Reason for encounter: medication management.  The patient comes today in a wheelchair with her caregiver nurse.  According to the PMP review the last time the patient had a prescription failed was on 05/23/2020. This was a prescription written on 03/17/2020 for a total of 120 tablets. This means that she used 120 tablets over a period of 153 days. This is less than 1 tablet/day. In fact, the patient was last seen on 03/17/2020 at which time she received a total of 3 prescriptions for her oxycodone IR.  She had all 3 prescriptions written on 03/17/2020 filled.  When the patient was asked about this, she indicated that she had some problems with the prior family member that was taking care of her.  Apparently this family member was picking up the prescriptions but she was not getting the medicine.  She had to get rid of her and now she comes in with a professional that is supposed to be helping her.  In either case, she was not receiving or taking they 120 tablets/month that I have been prescribing.  For this reason I will not be writing for the same amount.  Today I will be writing for perhaps 2  tablets/day.  She has been taking alprazolam on a regular basis and I have warned her caregiver to make sure that she does not take the opioid or the benzodiazepine within 8 hours of each other.  She understood and accepted and she communicated that to the patient.  Today the patient asked me if I could write a prescription for some antibiotics for a infection that she was having.  I asked her about this infection and she indicated that sometime ago she had to have a revascularization of her leg due to decreased blood flow into her right foot.  Even after that, she developed a dry gangrene of her middle toe and it looks like the toe is probably ready to fall off.  However, the patient indicates that this has taken a lot longer than usual and she is concerned about a possible infection.  I asked her to show me the area and she does have dry gangrene of the distal phalanx of the middle toe however the proximal part seems to be somewhat swollen and "pinkish".  I agree that there may be some infection setting in.  However, my recommendation today was to get an appointment to see her PCP or if that is not possible within the next couple days, to then go to the emergency room.  My concern about this is that if I go ahead and give her an antibiotic, then if she has to have cultures taken, that may affect the results and cause more problems.  Today I will order an x-ray of that foot since my main concern would be that it progresses to an osteomyelitis.  I spoke to her caregiver and she agreed that she would be taking her to the urgent care or the emergency room for further evaluation if she cannot get into see her PCP soon.  As to the patient has pain medication, will be writing for oxycodone IR 5 mg 1 tablet p.o. twice daily.  Tizanidine (Zanaflex):  UDS updates today.  RTCB: 12/11/2020 Nonopioids transfer 11/03/2020: Zanaflex  Pharmacotherapy Assessment   Analgesic: Oxycodone IR 5 mg, 1 tab PO BID (10 mg/day  of oxycodone) MME/day:28m/day.   Monitoring: [place] PMP: PDMP reviewed during this encounter. According to the PMP review the last time the patient had a prescription failed was on 05/23/2020. This was a prescription written on 03/17/2020 for a total of 120 tablets. Pharmacotherapy: No side-effects or adverse reactions reported. Compliance: No problems identified. Effectiveness: Clinically acceptable.  TDewayne Shorter RN  11/11/2020 12:49 PM  Signed Safety precautions to be maintained throughout the outpatient stay will include: orient to surroundings, keep bed in low position, maintain call bell within reach at all times, provide assistance with transfer out of bed and ambulation.    UDS:  Summary  Date Value Ref Range Status  10/06/2019 Note  Final    Comment:    ==================================================================== [employer] [facility] (MW) ==================================================================== Test                             Result       Flag       Units Drug Present and Declared for Prescription Verification   Alprazolam                     574  EXPECTED   ng/mg creat   Alpha-hydroxyalprazolam        >1587        EXPECTED   ng/mg creat    Source of alprazolam is a scheduled prescription medication. Alpha-    hydroxyalprazolam is an expected metabolite of alprazolam.   Oxycodone                      4542         EXPECTED   ng/mg creat   Oxymorphone                    3763         EXPECTED   ng/mg creat   Noroxycodone                   >7937        EXPECTED   ng/mg creat   Noroxymorphone                 548          EXPECTED   ng/mg creat    Sources of oxycodone are scheduled prescription medications.    Oxymorphone, noroxycodone, and noroxymorphone are expected    metabolites of oxycodone. Oxymorphone is also available as a    scheduled prescription medication. ==================================================================== Test                       Result    Flag   Units      Ref Range   Creatinine              126              mg/dL      >=20 ==================================================================== Declared Medications:  The flagging and interpretation on this report are based on the  following declared medications.  Unexpected results may arise from  inaccuracies in the declared medications.  **Note: The testing scope of this panel includes these medications:  Alprazolam  Oxycodone  **Note: The testing scope of this panel does not include the  following reported medications:  Albuterol (Combivent)  Albuterol (Duoneb)  Amlodipine  Bupropion  Escitalopram (Lexapro)  Fluticasone (Advair)  Ipratropium (Combivent)  Ipratropium (Duoneb)  Levothyroxine (Synthroid)  Magnesium  Montelukast (Singulair)  Salmeterol (Advair)  Tizanidine  Trazodone ==================================================================== For clinical consultation, please call [phone]. ====================================================================      ROS  Constitutional: Denies any fever or chills Gastrointestinal: No reported hemesis, hematochezia, vomiting, or acute GI distress Musculoskeletal: Denies any acute onset joint swelling, redness, loss of ROM, or weakness Neurological: No reported episodes of acute onset apraxia, aphasia, dysarthria, agnosia, amnesia, paralysis, loss of coordination, or loss of consciousness  Medication Review  ALPRAZolam, Fluticasone-Salmeterol, Ipratropium-Albuterol, amLODipine, aspirin EC, atorvastatin, buPROPion, clopidogrel, escitalopram, ipratropium-albuterol, levothyroxine, montelukast, oxyCODONE, tizanidine, and traZODone  History Review  Allergy: [name] has No Known Allergies. Drug: [name]  reports no history of drug use. Alcohol:  reports no history of alcohol use. Tobacco:  reports that she has been smoking cigarettes. She has been smoking about 0.50 packs per day. She  has never used smokeless tobacco. Social: [name]  reports that she has been smoking cigarettes. She has been smoking about 0.50 packs per day. She has never used smokeless tobacco. She reports that she does not drink alcohol and does not use drugs. Medical:  has a past medical history of Anxiety, CAD (coronary artery disease), Carpal tunnel syndrome, Cataract,  Complication of anesthesia, COPD (chronic obstructive pulmonary disease) ([place]), CRP elevated (09/14/2015), Depression, Difficult intubation, Dyslipidemia, Dyspnea, Dysrhythmia, Elevated sedimentation rate (09/14/2015), Esophageal spasm, Gastrointestinal parasites, GERD (gastroesophageal reflux disease), Hiatal hernia, History of peptic ulcer disease, Hyperthyroidism, Hypothyroidism, Low magnesium levels (09/14/2015), Pelvic fracture ([place]) (2008), Reflux, Rotator cuff injury, and Stenosis, spinal, lumbar. Surgical: Ms. Dimario  has a past surgical history that includes Cesarean section; Neck surgery; Cholecystectomy; Carpal tunnel release; Rotator cuff repair; Shoulder arthroscopy (12/07/2011); Shoulder surgery (12/07/2011); Intramedullary (im) nail intertrochanteric (Right, 10/01/2016); Fracture surgery; Appendectomy; Nose surgery; Back surgery; Cataract extraction w/PHACO (Right, 08/07/2017); Hip surgery; Cataract extraction w/PHACO (Left, 09/04/2017); Kyphoplasty (N/A, 08/20/2018); and Lower Extremity Angiography (Right, 12/18/2019). Family: family history includes Aneurysm in an other family member.  Laboratory Chemistry Profile   Renal Lab Results  Component Value Date   BUN 11 12/18/2019   CREATININE 0.86 12/18/2019   BCR 9 (L) 10/06/2019   GFRAA >60 12/18/2019   GFRNONAA >60 12/18/2019     Hepatic Lab Results  Component Value Date   AST 21 12/02/2019   ALT 57 (H) 12/02/2019   ALBUMIN 3.2 (L) 12/02/2019   ALKPHOS 148 (H) 12/02/2019     Electrolytes Lab Results  Component Value Date   NA 138 12/02/2019   K 3.3 (L) 12/02/2019   CL  100 12/02/2019   CALCIUM 8.7 (L) 12/02/2019   MG 1.5 (L) 10/06/2019   PHOS 3.4 08/20/2018     Bone Lab Results  Component Value Date   25OHVITD1 15 (L) 10/06/2019   25OHVITD2 <1.0 10/06/2019   25OHVITD3 14 10/06/2019     Inflammation (CRP: Acute Phase) (ESR: Chronic Phase) Lab Results  Component Value Date   CRP 5 10/06/2019   ESRSEDRATE 15 10/06/2019   LATICACIDVEN 1.2 12/02/2019       Note: Above Lab results reviewed.  Recent Imaging Review  PERIPHERAL VASCULAR CATHETERIZATION See op note Note: Reviewed        Physical Exam  General appearance: Well nourished, well developed, and well hydrated. In no apparent acute distress Mental status: Alert, oriented x 3 (person, place, & time)       Respiratory: No evidence of acute respiratory distress Eyes: PERLA Vitals: BP 113/62   Pulse (!) 137   Temp (!) 96.6 F (35.9 C)   Resp 18   Ht 5' 4"  (1.626 m)   Wt 130 lb (59 kg)   SpO2 96%   BMI 22.31 kg/m  BMI: Estimated body mass index is 22.31 kg/m as calculated from the following:   Height as of this encounter: 5' 4"  (1.626 m).   Weight as of this encounter: 130 lb (59 kg). Ideal: Ideal body weight: 54.7 kg (120 lb 9.5 oz) Adjusted ideal body weight: 56.4 kg (124 lb 5.7 oz)  Assessment   Status Diagnosis  Controlled Controlled Controlled 1. Chronic pain syndrome   2. Chronic low back pain (Primary Area of Pain) (Right)   3. Chronic neck pain (Secondary area of Pain) (Bilateral) (R>L)   4. Pharmacologic therapy   5. Uncomplicated opioid dependence ([place])   6. Chronic shoulder pain (Bilateral) (status post multiple surgeries) (R>L)   7. 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 Encounter for long-term opiate analgesic use   9. Dry gangrene (HCC) of middle toe, right foot   10. Foot pain, right   11. Bilateral hearing loss, unspecified hearing loss type      Updated Problems: Problem  Dry gangrene (HCC) of  middle toe, right  foot  Foot Pain, Right  Chronic hip pain (Bilateral)  Chronic low back pain (1ry area of Pain) (Right) w/o sciatica  Chronic neck pain (2ry area of Pain) (Bilateral) (R>L)  Encounter for Long-Term Opiate Analgesic Use  Hard of Hearing  Vitamin D Deficiency    Plan of Care  Problem-specific:  No problem-specific Assessment & Plan notes found for this encounter.  [name] has a current medication list which includes the following long-term medication(s): amlodipine, atorvastatin, bupropion, escitalopram, ipratropium-albuterol, ipratropium-albuterol, levothyroxine, tizanidine, trazodone, fluticasone-salmeterol, oxycodone, [START ON 12/11/2020] oxycodone, and [START ON 01/10/2021] oxycodone.  Pharmacotherapy (Medications Ordered): Meds ordered this encounter  Medications  . oxyCODONE (OXY IR/ROXICODONE) 5 MG immediate release tablet    Sig: Take 1 tablet (5 mg total) by mouth 2 (two) times daily as needed for severe pain. Must last 30 days    Dispense:  60 tablet    Refill:  0    Chronic Pain: STOP Act (Not applicable) Fill 1 day early if closed on refill date. Avoid benzodiazepines within 8 hours of opioids  . oxyCODONE (OXY IR/ROXICODONE) 5 MG immediate release tablet    Sig: Take 1 tablet (5 mg total) by mouth 2 (two) times daily as needed for severe pain. Must last 30 days    Dispense:  60 tablet    Refill:  0    Chronic Pain: STOP Act (Not applicable) Fill 1 day early if closed on refill date. Avoid benzodiazepines within 8 hours of opioids  . oxyCODONE (OXY IR/ROXICODONE) 5 MG immediate release tablet    Sig: Take 1 tablet (5 mg total) by mouth 2 (two) times daily as needed for severe pain. Must last 30 days    Dispense:  60 tablet    Refill:  0    Chronic Pain: STOP Act (Not applicable) Fill 1 day early if closed on refill date. Avoid benzodiazepines within 8 hours of opioids   Orders:  Orders Placed This Encounter  Procedures  . DG Foot Complete Right    Standing  Status:   Future    Standing Expiration Date:   12/12/2020    Scheduling Instructions:     Imaging must be done as soon as possible. Inform patient that order will expire within 30 days and I will not renew it. Please evaluate for osteomyelitis.    Order Specific Question:   Reason for Exam (SYMPTOM  OR DIAGNOSIS REQUIRED)    Answer:   Middle toe dry gangrene with swelling and pain.    Order Specific Question:   Preferred imaging location?    Answer:   [facility]    Order Specific Question:   Call Results- Best Contact Number?    Answer:   [phone] ([facility])    Order Specific Question:   Radiology Contrast Protocol - do NOT remove file path    Answer:   [email or web address]    Order Specific Question:   Release to patient    Answer:   Immediate   Follow-up plan:   Return in about 3 months (around 02/09/2021) for (F2F), ([facility]).      Interventional management options: Considering:   Possible right SI joint RFA Possible right lumbar facet RFA Diagnostic bilateral cervical facetblock  Possible bilateral cervical facet RFA  Diagnostic right CESI  Diagnostic bilateral IA shoulder injection  Diagnostic bilateral suprascapularNB  Possible bilateral suprascapular nerve RFA  Diagnostic left L3-4 TFESI  Diagnostic L3-4 vs L4-5 LESI  Palliative PRN treatment(s):   Diagnostic rightlumbarfacet + right SI joint block#2 Palliative right SI joint block Palliative right lumbar facet block       Recent Visits No visits were found meeting these conditions. Showing recent visits within past 90 days and meeting all other requirements Today's Visits Date Type Provider Dept  11/11/20 Office Visit Milinda Pointer, MD [facility]  Showing today's visits and meeting all other requirements Future Appointments Date Type Provider Dept  02/07/21 Appointment Milinda Pointer, MD [facility]  Showing future  appointments within next 90 days and meeting all other requirements  I discussed the assessment and treatment plan with the patient. The patient was provided an opportunity to ask questions and all were answered. The patient agreed with the plan and demonstrated an understanding of the instructions.  Patient advised to call back or seek an in-person evaluation if the symptoms or condition worsens.  Duration of encounter: 47 minutes.  Note by: Gaspar Cola, MD Date: 11/11/2020; Time: 1:40 PM

## 2020-11-11 NOTE — Progress Notes (Signed)
Safety precautions to be maintained throughout the outpatient stay will include: orient to surroundings, keep bed in low position, maintain call bell within reach at all times, provide assistance with transfer out of bed and ambulation.  

## 2020-11-24 ENCOUNTER — Telehealth (INDEPENDENT_AMBULATORY_CARE_PROVIDER_SITE_OTHER): Payer: Self-pay | Admitting: Vascular Surgery

## 2020-11-24 ENCOUNTER — Other Ambulatory Visit (INDEPENDENT_AMBULATORY_CARE_PROVIDER_SITE_OTHER): Payer: Self-pay | Admitting: Vascular Surgery

## 2020-11-24 DIAGNOSIS — I739 Peripheral vascular disease, unspecified: Secondary | ICD-10-CM

## 2020-11-24 DIAGNOSIS — I96 Gangrene, not elsewhere classified: Secondary | ICD-10-CM

## 2020-11-24 NOTE — Telephone Encounter (Signed)
Her caretaker is Annice Pih. [phone]

## 2020-11-24 NOTE — Telephone Encounter (Signed)
Got a call from Dr. Arlana Pouch. This lady has a gangrenous toe that we will need to do a toe amputation on. I'd like to get her in with [facility]. We have done previous intervention last year. Even if she comes in for a lab only, I can look at the study and then we can get her set up for surgery. If her ABIs are down, she will need an angiogram first.- JD

## 2020-11-25 ENCOUNTER — Telehealth (INDEPENDENT_AMBULATORY_CARE_PROVIDER_SITE_OTHER): Payer: Self-pay

## 2020-11-25 ENCOUNTER — Other Ambulatory Visit: Payer: Self-pay

## 2020-11-25 ENCOUNTER — Ambulatory Visit (INDEPENDENT_AMBULATORY_CARE_PROVIDER_SITE_OTHER): Payer: Medicare HMO

## 2020-11-25 DIAGNOSIS — I739 Peripheral vascular disease, unspecified: Secondary | ICD-10-CM | POA: Diagnosis not present

## 2020-11-25 DIAGNOSIS — I96 Gangrene, not elsewhere classified: Secondary | ICD-10-CM | POA: Diagnosis not present

## 2020-11-25 NOTE — Telephone Encounter (Signed)
Scheduled patient

## 2020-11-25 NOTE — Telephone Encounter (Signed)
Cody Hosmer RT,RVT, RDMS:  Right ABI=0.65/0.50 with no flow in right popliteal stent; Left ABI=1.01/0.98  Dr. [name]: Sounds like she will need an angio. If Vernona Rieger can set up for next week that would be good. I can do H&P that day.  Patient scheduled for a right leg angio on 12/02/20 with a 10:00 am arrival time to the [facility]. Covid testing on 11/30/20 between 8-1 pm at the [facility]. Pre-procedure instructions were discussed and will be mailed.

## 2020-11-29 ENCOUNTER — Other Ambulatory Visit (INDEPENDENT_AMBULATORY_CARE_PROVIDER_SITE_OTHER): Payer: Self-pay | Admitting: Nurse Practitioner

## 2020-11-29 NOTE — Telephone Encounter (Signed)
Tramadol was called into the patients pharmacy on 11/25/20 per Dr. Lucky Cowboy for pain.

## 2020-11-30 ENCOUNTER — Other Ambulatory Visit
Admission: RE | Admit: 2020-11-30 | Discharge: 2020-11-30 | Disposition: A | Discharge status: Home / Self Care | Payer: Medicare HMO | Source: Ambulatory Visit | Attending: Vascular Surgery | Admitting: Vascular Surgery

## 2020-11-30 ENCOUNTER — Other Ambulatory Visit: Payer: Self-pay

## 2020-11-30 DIAGNOSIS — Z20822 Contact with and (suspected) exposure to covid-19: Secondary | ICD-10-CM | POA: Insufficient documentation

## 2020-11-30 DIAGNOSIS — Z01812 Encounter for preprocedural laboratory examination: Secondary | ICD-10-CM | POA: Diagnosis present

## 2020-12-01 LAB — SARS CORONAVIRUS 2 (TAT 6-24 HRS): SARS Coronavirus 2: NEGATIVE

## 2020-12-02 ENCOUNTER — Other Ambulatory Visit: Payer: Self-pay

## 2020-12-02 ENCOUNTER — Ambulatory Visit
Admission: RE | Admit: 2020-12-02 | Discharge: 2020-12-02 | Disposition: A | Discharge status: Home / Self Care | Payer: Medicare HMO | Attending: Vascular Surgery | Admitting: Vascular Surgery

## 2020-12-02 ENCOUNTER — Encounter: Payer: Self-pay | Admitting: Certified Registered"

## 2020-12-02 ENCOUNTER — Encounter
Admission: RE | Disposition: A | Discharge status: Home / Self Care | Payer: Self-pay | Source: Home / Self Care | Attending: Vascular Surgery

## 2020-12-02 ENCOUNTER — Encounter: Payer: Self-pay | Admitting: Vascular Surgery

## 2020-12-02 DIAGNOSIS — Z7902 Long term (current) use of antithrombotics/antiplatelets: Secondary | ICD-10-CM | POA: Diagnosis not present

## 2020-12-02 DIAGNOSIS — I70269 Atherosclerosis of native arteries of extremities with gangrene, unspecified extremity: Secondary | ICD-10-CM

## 2020-12-02 DIAGNOSIS — Z7982 Long term (current) use of aspirin: Secondary | ICD-10-CM | POA: Insufficient documentation

## 2020-12-02 DIAGNOSIS — E785 Hyperlipidemia, unspecified: Secondary | ICD-10-CM | POA: Insufficient documentation

## 2020-12-02 DIAGNOSIS — G894 Chronic pain syndrome: Secondary | ICD-10-CM | POA: Insufficient documentation

## 2020-12-02 DIAGNOSIS — I70261 Atherosclerosis of native arteries of extremities with gangrene, right leg: Secondary | ICD-10-CM

## 2020-12-02 DIAGNOSIS — F17210 Nicotine dependence, cigarettes, uncomplicated: Secondary | ICD-10-CM | POA: Diagnosis not present

## 2020-12-02 HISTORY — PX: LOWER EXTREMITY ANGIOGRAPHY: CATH118251

## 2020-12-02 SURGERY — LOWER EXTREMITY ANGIOGRAPHY
Anesthesia: Moderate Sedation | Site: Leg Lower | Laterality: Right

## 2020-12-02 MED ORDER — SODIUM CHLORIDE 0.9 % IV SOLN: INTRAVENOUS | Status: DC

## 2020-12-02 MED ORDER — ONDANSETRON HCL 4 MG/2ML IJ SOLN: 4.0000 mg | Freq: Four times a day (QID) | INTRAMUSCULAR | Status: DC | PRN

## 2020-12-02 MED ORDER — CEFAZOLIN SODIUM-DEXTROSE 2-4 GM/100ML-% IV SOLN: 2.0000 g | Freq: Once | INTRAVENOUS | Status: DC

## 2020-12-02 MED ORDER — DIPHENHYDRAMINE HCL 50 MG/ML IJ SOLN
INTRAMUSCULAR | Status: AC
  Filled 2020-12-02: qty 1

## 2020-12-02 MED ORDER — DIPHENHYDRAMINE HCL 50 MG/ML IJ SOLN
INTRAMUSCULAR | Status: DC | PRN
  Administered 2020-12-02 (×2): 25 mg via INTRAVENOUS

## 2020-12-02 MED ORDER — MIDAZOLAM HCL 2 MG/ML PO SYRP: 8.0000 mg | ORAL_SOLUTION | Freq: Once | ORAL | Status: AC | PRN

## 2020-12-02 MED ORDER — FAMOTIDINE 20 MG PO TABS: 40.0000 mg | ORAL_TABLET | Freq: Once | ORAL | Status: DC | PRN

## 2020-12-02 MED ORDER — METHYLPREDNISOLONE SODIUM SUCC 125 MG IJ SOLR: 125.0000 mg | Freq: Once | INTRAMUSCULAR | Status: DC | PRN

## 2020-12-02 MED ORDER — ASPIRIN EC 81 MG PO TBEC: 81.0000 mg | DELAYED_RELEASE_TABLET | Freq: Every day | ORAL | 2 refills | Status: DC

## 2020-12-02 MED ORDER — HEPARIN SODIUM (PORCINE) 1000 UNIT/ML IJ SOLN
INTRAMUSCULAR | Status: DC | PRN
  Administered 2020-12-02: 5000 [IU] via INTRAVENOUS

## 2020-12-02 MED ORDER — HYDROMORPHONE HCL 1 MG/ML IJ SOLN: 1.0000 mg | Freq: Once | INTRAMUSCULAR | Status: AC | PRN

## 2020-12-02 MED ORDER — DIPHENHYDRAMINE HCL 50 MG/ML IJ SOLN: 50.0000 mg | Freq: Once | INTRAMUSCULAR | Status: DC | PRN

## 2020-12-02 MED ORDER — MIDAZOLAM HCL 2 MG/2ML IJ SOLN
INTRAMUSCULAR | Status: DC | PRN
  Administered 2020-12-02 (×2): 1 mg via INTRAVENOUS
  Administered 2020-12-02: 2 mg via INTRAVENOUS
  Administered 2020-12-02: 1 mg via INTRAVENOUS

## 2020-12-02 MED ORDER — MIDAZOLAM HCL 5 MG/5ML IJ SOLN
INTRAMUSCULAR | Status: AC
  Filled 2020-12-02: qty 5

## 2020-12-02 MED ORDER — FENTANYL CITRATE (PF) 100 MCG/2ML IJ SOLN
INTRAMUSCULAR | Status: DC | PRN
  Administered 2020-12-02 (×2): 50 ug via INTRAVENOUS

## 2020-12-02 MED ORDER — HYDROMORPHONE HCL 1 MG/ML IJ SOLN
INTRAMUSCULAR | Status: AC
  Administered 2020-12-02: 1 mg via INTRAVENOUS
  Filled 2020-12-02: qty 1

## 2020-12-02 MED ORDER — MIDAZOLAM HCL 2 MG/ML PO SYRP
ORAL_SOLUTION | ORAL | Status: AC
  Administered 2020-12-02: 8 mg via ORAL
  Filled 2020-12-02: qty 4

## 2020-12-02 MED ORDER — IODIXANOL 320 MG/ML IV SOLN
INTRAVENOUS | Status: DC | PRN
  Administered 2020-12-02: 55 mg

## 2020-12-02 MED ORDER — FENTANYL CITRATE (PF) 100 MCG/2ML IJ SOLN
INTRAMUSCULAR | Status: AC
  Filled 2020-12-02: qty 2

## 2020-12-02 SURGICAL SUPPLY — 18 items
BALLN LUTONIX 018 4X150X130 (BALLOONS) ×2
BALLN ULTRVRSE 3X220X150 (BALLOONS) ×2
BALLOON LUTONIX 018 4X150X130 (BALLOONS) IMPLANT
BALLOON ULTRVRSE 3X220X150 (BALLOONS) IMPLANT
CATH BEACON 5 .038 100 VERT TP (CATHETERS) ×1 IMPLANT
CATH PIG 70CM (CATHETERS) ×1 IMPLANT
CATH ROTAREX 135 6FR (CATHETERS) ×1 IMPLANT
DEVICE STARCLOSE SE CLOSURE (Vascular Products) ×1 IMPLANT
GLIDEWIRE ADV .035X260CM (WIRE) ×1 IMPLANT
KIT ENCORE 26 ADVANTAGE (KITS) ×1 IMPLANT
PACK ANGIOGRAPHY (CUSTOM PROCEDURE TRAY) ×2 IMPLANT
SHEATH ANL2 6FRX45 HC (SHEATH) ×1 IMPLANT
SHEATH BRITE TIP 5FRX11 (SHEATH) ×1 IMPLANT
STENT VIABAHN 5X7.5X120 (Permanent Stent) ×1 IMPLANT
STENT VIABAHN 6X150X120 (Permanent Stent) ×1 IMPLANT
TUBING CONTRAST HIGH PRESS 72 (TUBING) ×1 IMPLANT
WIRE G V18X300CM (WIRE) ×2 IMPLANT
WIRE GUIDERIGHT .035X150 (WIRE) ×1 IMPLANT

## 2020-12-02 NOTE — Op Note (Signed)
[facility]  Percutaneous Study/Intervention Procedural Note   Date of Surgery: 12/02/2020  Surgeon(s):Kenric Ginger    Assistants:none  Pre-operative Diagnosis: PAD with gangrene right foot  Post-operative diagnosis:  Same  Procedure(s) Performed:             1.  Ultrasound guidance for vascular access left femoral artery             2.  Catheter placement into right common femoral artery from left femoral approach             3.  Aortogram and selective right lower extremity angiogram             4.  mechanical thrombectomy of the right distal SFA and popliteal artery with the [place] Rex device             5.  percutaneous transluminal angioplasty of the right anterior tibial artery with 3 mm diameter angioplasty balloon  6.  Viabahn stent placement to the right most distal SFA and popliteal artery with 6 mm diameter by 15 cm length stent for residual stenosis and thrombus after above procedures  7. Viabahn stent placement to the right anterior tibial artery with 5 mm diameter by 7.5 cm length stent             8.  StarClose closure device left femoral artery  EBL: 10 cc  Contrast: 55 cc  Fluoro Time: 10.2 minutes  Moderate Conscious Sedation Time: approximately 55 minutes using 5 mg of Versed and 100 mcg of Fentanyl              Indications:  Patient is a 75 y.o.female with a gangrenous right toe. The patient has noninvasive study showing occlusion of her previous intervention with significant reduction of flow distally. The patient is brought in for angiography for further evaluation and potential treatment.  Due to the limb threatening nature of the situation, angiogram was performed for attempted limb salvage. The patient is aware that if the procedure fails, amputation would be expected.  The patient also understands that even with successful revascularization, amputation may still be required due to the severity of the situation.  Risks and benefits are  discussed and informed consent is obtained.   Procedure:  The patient was identified and appropriate procedural time out was performed.  The patient was then placed supine on the table and prepped and draped in the usual sterile fashion. Moderate conscious sedation was administered during a face to face encounter with the patient throughout the procedure with my supervision of the RN administering medicines and monitoring the patient's vital signs, pulse oximetry, telemetry and mental status throughout from the start of the procedure until the patient was taken to the recovery room. Ultrasound was used to evaluate the left common femoral artery.  It was patent .  A digital ultrasound image was acquired.  A Seldinger needle was used to access the left common femoral artery under direct ultrasound guidance and a permanent image was performed.  A 0.035 J wire was advanced without resistance and a 5Fr sheath was placed.  Pigtail catheter was placed into the aorta and an AP aortogram was performed. This demonstrated normal aorta and iliac arteries.  Renal arteries did not show any obvious disease. I then crossed the aortic bifurcation and advanced to the right femoral head. Selective right lower extremity angiogram was then performed. This demonstrated normal common femoral artery, profunda femoris artery, and proximal superficial femoral artery.  In  the mid to distal segment, superficial femoral artery occluded above the previously placed stent.  The stent was occluded and there was occlusion of the popliteal artery and the proximal portions of all 3 tibial vessels.  The anterior tibial artery reconstituted after initial occlusion and there was some mild to moderate disease in the 50 to 60% range a few centimeters beyond the origin.  It was then continuous down to the foot and was the only runoff distally.  There was some faint reconstitution of the peroneal artery but it had very sluggish flow and did not appear to  have flow distally. It was felt that it was in the patient's best interest to proceed with intervention after these images to avoid a second procedure and a larger amount of contrast and fluoroscopy based off of the findings from the initial angiogram. The patient was systemically heparinized and a 6 [place] Ansell sheath was then placed over the [employer] wire. I then used a Kumpe catheter and the advantage wire to cross the occlusions in the distal SFA and popliteal artery as well as the proximal anterior tibial artery and get down into the mid anterior tibial artery where intraluminal flow was confirmed.  Then exchanged for a V 18 wire.  I elected to treat the chronic thrombus with in the stent and above and below the stent with mechanical thrombectomy.  I used the [place] Rex device I made 2 passes from the mid to distal SFA down to the distal popliteal artery and most proximal anterior tibial artery.  The patient had continuous motion and image quality was very poor, but after thrombectomy there remained significant stenosis and thrombus just above the previously placed stent and there was an area of extravasation at the origin of the anterior tibial artery and the most distal popliteal artery.  There also remains significant stenosis in the proximal anterior tibial artery.  I then used a 3 mm diameter angioplasty balloon inflated in the proximal anterior tibial artery up to the most distal popliteal artery inflated this to 6 atm for 2 minutes.  Completion imaging showed residual stenosis and extravasation near the origin of the anterior tibial artery and the above residual stenosis and thrombus in the popliteal artery and most distal SFA.  I then elected to treat these areas with covered stents.  A 6 mm diameter by 15 cm length Viabahn stent was deployed for the most distal SFA down to the below-knee popliteal artery.  A 5 mm diameter by 7.5 cm length Viabahn stent was deployed in the proximal anterior  tibial artery bridging up to the stent in the below-knee popliteal artery.  The more proximal portion was postdilated with a 4 mm balloon and the more distal portion was not postdilated.  Completion imaging showed good flow through the stents with less than 10% residual stenosis. I elected to terminate the procedure. The sheath was removed and StarClose closure device was deployed in the left femoral artery with excellent hemostatic result. The patient was taken to the recovery room in stable condition having tolerated the procedure well.  Findings:               Aortogram:  normal aorta and iliac arteries.  Renal arteries did not show any obvious disease.             Right Lower Extremity:  Normal common femoral artery, profunda femoris artery, and proximal superficial femoral artery.  In the mid to distal segment, superficial femoral artery occluded  above the previously placed stent.  The stent was occluded and there was occlusion of the popliteal artery and the proximal portions of all 3 tibial vessels.  The anterior tibial artery reconstituted after initial occlusion and there was some mild to moderate disease in the 50 to 60% range a few centimeters beyond the origin.  It was then continuous down to the foot and was the only runoff distally.  There was some faint reconstitution of the peroneal artery but it had very sluggish flow and did not appear to have flow distally.   Disposition: Patient was taken to the recovery room in stable condition having tolerated the procedure well.  Complications: None  Leotis Pain 12/02/2020 12:53 PM   This note was created with Dragon Medical transcription system. Any errors in dictation are purely unintentional.

## 2020-12-02 NOTE — H&P (Signed)
[facility] Admission History & Physical  MRN : [id number]  Anneli Bing is a 75 y.o. ([birth year]) female who presents with chief complaint of No chief complaint on file.  History of Present Illness:  The patient is a 75 year old female with multiple issues including known atherosclerotic disease bilateral lower extremity who presents today for scheduled angiogram.  Patient is well-known to our service.  Patient has a known history of atherosclerotic disease with gangrene to the lower extremity.  Patient has gangrenous changes to the toes on the right lower extremity.  This is associated with discomfort with and without ambulation.  Patient with recent decline in her ABI.  No flow noted in right popliteal stent.  Patient denies any fever, nausea vomiting.  Patient denies any shortness of breath or chest pain.  Patient presents today for scheduled right lower extremity angiogram possible intervention.  Current Facility-Administered Medications  Medication Dose Route Frequency Provider Last Rate Last Admin  . 0.9 %  sodium chloride infusion   Intravenous Continuous Kris Hartmann, NP      . ceFAZolin (ANCEF) IVPB 2g/100 mL premix  2 g Intravenous Once Kris Hartmann, NP      . diphenhydrAMINE (BENADRYL) injection 50 mg  50 mg Intravenous Once PRN Kris Hartmann, NP      . famotidine (PEPCID) tablet 40 mg  40 mg Oral Once PRN Kris Hartmann, NP      . HYDROmorphone (DILAUDID) injection 1 mg  1 mg Intravenous Once PRN Eulogio Ditch E, NP      . methylPREDNISolone sodium succinate (SOLU-MEDROL) 125 mg/2 mL injection 125 mg  125 mg Intravenous Once PRN Eulogio Ditch E, NP      . midazolam (VERSED) 2 MG/ML syrup 8 mg  8 mg Oral Once PRN Kris Hartmann, NP      . ondansetron (ZOFRAN) injection 4 mg  4 mg Intravenous Q6H PRN Kris Hartmann, NP       Past Medical History:  Diagnosis Date  . Anxiety   . CAD (coronary artery disease)   . Carpal tunnel syndrome   .  Cataract   . Complication of anesthesia    has woken up at times  . COPD (chronic obstructive pulmonary disease) ([place])   . CRP elevated 09/14/2015  . Depression   . Difficult intubation   . Dyslipidemia   . Dyspnea    DOE  . Dysrhythmia    hx palpatations  . Elevated sedimentation rate 09/14/2015  . Esophageal spasm   . Gastrointestinal parasites   . GERD (gastroesophageal reflux disease)   . Hiatal hernia   . History of peptic ulcer disease   . Hyperthyroidism   . Hypothyroidism   . Low magnesium levels 09/14/2015  . Pelvic fracture ([place]) 2008   fall from riding a horse  . Reflux   . Rotator cuff injury   . Stenosis, spinal, lumbar    Past Surgical History:  Procedure Laterality Date  . APPENDECTOMY    . BACK SURGERY     CERVICAL FUSION  . CARPAL TUNNEL RELEASE    . CATARACT EXTRACTION W/PHACO Right 08/07/2017   Procedure: CATARACT EXTRACTION PHACO AND INTRAOCULAR LENS PLACEMENT (IOC);  Surgeon: Birder Robson, MD;  Location: [facility];  Service: Ophthalmology;  Laterality: Right;  [place] 00:52.0 AP% 16.8 CDE 8.74 Fluid Pack Lot # [id number]  . CATARACT EXTRACTION W/PHACO Left 09/04/2017   Procedure: CATARACT EXTRACTION PHACO AND INTRAOCULAR LENS PLACEMENT (IOC);  Surgeon:  Birder Robson, MD;  Location: [facility];  Service: Ophthalmology;  Laterality: Left;  [place] 00:34 AP% 17.0 CDE 5.80 Fluid pack lot # [id number]  . CESAREAN SECTION    . CHOLECYSTECTOMY    . FRACTURE SURGERY    . HIP SURGERY    . INTRAMEDULLARY (IM) NAIL INTERTROCHANTERIC Right 10/01/2016   Procedure: INTRAMEDULLARY (IM) NAIL INTERTROCHANTRIC;  Surgeon: Corky Mull, MD;  Location: [facility];  Service: Orthopedics;  Laterality: Right;  . KYPHOPLASTY N/A 08/20/2018   Procedure: [id number];  Surgeon: Hessie Knows, MD;  Location: [facility];  Service: Orthopedics;  Laterality: N/A;  . LOWER EXTREMITY ANGIOGRAPHY Right 12/18/2019   Procedure: LOWER EXTREMITY ANGIOGRAPHY;  Surgeon: Algernon Huxley, MD;   Location: [facility];  Service: Cardiovascular;  Laterality: Right;  . NECK SURGERY    . NOSE SURGERY    . ROTATOR CUFF REPAIR     x2  . SHOULDER ARTHROSCOPY  12/07/2011   Procedure: ARTHROSCOPY SHOULDER;  Surgeon: Ninetta Lights, MD;  Location: [facility];  Service: Orthopedics;  Laterality: Right;  Debridement Partial Cuff Tear, Release Coracoacromial Ligament  . SHOULDER SURGERY  12/07/2011   right   Social History Social History   Tobacco Use  . Smoking status: Current Every Day Smoker    Packs/day: 0.50    Types: Cigarettes  . Smokeless tobacco: Never Used  . Tobacco comment: down to 2 cigs per day  Substance Use Topics  . Alcohol use: No  . Drug use: No   Family History Family History  Problem Relation Age of Onset  . Aneurysm Other   Denies family history of peripheral artery disease, venous disease or renal disease  No Known Allergies  REVIEW OF SYSTEMS (Negative unless checked)  Constitutional: [] Weight loss  [] Fever  [] Chills Cardiac: [] Chest pain   [] Chest pressure   [] Palpitations   [] Shortness of breath when laying flat   [] Shortness of breath at rest   [] Shortness of breath with exertion. Vascular:  [x] Pain in legs with walking   [] Pain in legs at rest   [] Pain in legs when laying flat   [] Claudication   [x] Pain in feet when walking  [x] Pain in feet at rest  [x] Pain in feet when laying flat   [] History of DVT   [] Phlebitis   [] Swelling in legs   [] Varicose veins   [] Non-healing ulcers Pulmonary:   [] Uses home oxygen   [] Productive cough   [] Hemoptysis   [] Wheeze  [] COPD   [] Asthma Neurologic:  [] Dizziness  [] Blackouts   [] Seizures   [] History of stroke   [] History of TIA  [] Aphasia   [] Temporary blindness   [] Dysphagia   [] Weakness or numbness in arms   [] Weakness or numbness in legs Musculoskeletal:  [] Arthritis   [] Joint swelling   [] Joint pain   [] Low back pain Hematologic:  [] Easy bruising  [] Easy bleeding   [] Hypercoagulable state    [] Anemic  [] Hepatitis Gastrointestinal:  [] Blood in stool   [] Vomiting blood  [] Gastroesophageal reflux/heartburn   [] Difficulty swallowing. Genitourinary:  [] Chronic kidney disease   [] Difficult urination  [] Frequent urination  [] Burning with urination   [] Blood in urine Skin:  [] Rashes   [x] Ulcers   [x] Wounds Psychological:  [] History of anxiety   []  History of major depression.  Physical Examination  Vitals:   12/02/20 1014  BP: 127/84  Pulse: (!) 114  Resp: (!) 22  SpO2: 95%   There is no height or weight on file to calculate BMI. Gen: WD/WN, NAD  Head: [place]/AT, No temporalis wasting.  Ear/Nose/Throat: Hearing grossly intact, nares w/o erythema or drainage, oropharynx w/o Erythema/Exudate, Eyes: Sclera non-icteric, conjunctiva clear Neck: Supple, no nuchal rigidity.  No JVD.  Pulmonary:  Good air movement, no increased work of respiration or use of accessory muscles  Cardiac: RRR, normal S1, S2, no Murmurs, rubs or gallops. Vascular:  Vessel Right Left  Radial Palpable Palpable  Ulnar Palpable Palpable  Brachial Palpable Palpable  Carotid Palpable, without bruit Palpable, without bruit  Aorta Not palpable N/A  Femoral Palpable Palpable  Popliteal Palpable Palpable  PT Non-Palpable Palpable  DP Non-Palpable Palpable   Right lower extremity: Thigh soft.  Calf soft.  Extremities warm distally toes.  There is no acute vascular compromise noted to the extremity at this time.  Gangrenous changes noted to the right toes.  Motor/sensory is intact.  Gastrointestinal: soft, non-tender/non-distended. No guarding/reflex. No masses, surgical incisions, or scars. Musculoskeletal: M/S 5/5 throughout.  No deformity or atrophy.  mild edema Neurologic: Sensation grossly intact in extremities.  Symmetrical.  Speech is fluent. Motor exam as listed above. Psychiatric: Judgment intact, Mood & affect appropriate for pt's clinical situation. Dermatologic: As above  Lymph : No Cervical, Axillary,  or Inguinal lymphadenopathy.  CBC Lab Results  Component Value Date   WBC 9.8 12/02/2019   HGB 11.7 (L) 12/02/2019   HCT 37.2 12/02/2019   MCV 89.0 12/02/2019   PLT 309 12/02/2019   BMET    Component Value Date/Time   NA 138 12/02/2019 2223   NA 143 10/06/2019 1552   NA 138 02/10/2014 1335   K 3.3 (L) 12/02/2019 2223   K 4.5 02/10/2014 1335   CL 100 12/02/2019 2223   CL 106 02/10/2014 1335   CO2 28 12/02/2019 2223   CO2 28 02/10/2014 1335   GLUCOSE 151 (H) 12/02/2019 2223   GLUCOSE 99 02/10/2014 1335   BUN 11 12/18/2019 1012   BUN 7 (L) 10/06/2019 1552   BUN 6 (L) 02/10/2014 1335   CREATININE 0.86 12/18/2019 1012   CREATININE 0.89 02/10/2014 1335   CALCIUM 8.7 (L) 12/02/2019 2223   CALCIUM 8.7 02/10/2014 1335   GFRNONAA >60 12/18/2019 1012   GFRNONAA >60 02/10/2014 1335   GFRAA >60 12/18/2019 1012   GFRAA >60 02/10/2014 1335   CrCl cannot be calculated (Patient's most recent lab result is older than the maximum 21 days allowed.).  COAG Lab Results  Component Value Date   INR 1.08 10/01/2016   Radiology VAS [place] ABI WITH/WO TBI  Result Date: 11/30/2020 LOWER EXTREMITY DOPPLER STUDY Indications: Right 3rd toe gangrene.  Vascular Interventions: 12/18/19: Right popliteal artery stent with PT trunk                         PTA;. Performing Technologist: Blondell Reveal RT, RDMS, RVT  Examination Guidelines: A complete evaluation includes at minimum, Doppler waveform signals and systolic blood pressure reading at the level of bilateral brachial, anterior tibial, and posterior tibial arteries, when vessel segments are accessible. Bilateral testing is considered an integral part of a complete examination. Photoelectric Plethysmograph (PPG) waveforms and toe systolic pressure readings are included as required and additional duplex testing as needed. Limited examinations for reoccurring indications may be performed as noted.  ABI Findings:  +---------+------------------+-----+----------+--------+ Right    Rt Pressure (mmHg)IndexWaveform  Comment  +---------+------------------+-----+----------+--------+ Brachial 113                                       +---------+------------------+-----+----------+--------+  ATA      73                0.65 monophasic         +---------+------------------+-----+----------+--------+ PTA      56                0.50 monophasic         +---------+------------------+-----+----------+--------+ Great Toe37                0.33 Abnormal           +---------+------------------+-----+----------+--------+ +---------+------------------+-----+--------+-------+ Left     Lt Pressure (mmHg)IndexWaveformComment +---------+------------------+-----+--------+-------+ Brachial 113                                    +---------+------------------+-----+--------+-------+ ATA      114               1.01 biphasic        +---------+------------------+-----+--------+-------+ PTA      111               0.98 biphasic        +---------+------------------+-----+--------+-------+ Great Toe94                0.83 Normal          +---------+------------------+-----+--------+-------+ Limited right leg duplex demonstrated occluded right mid to distal popliteal artery with collateral seen at distal SFA/above-knee popliteal artery region. No previous ABI available for comparison.  Summary: Right: Resting right ankle-brachial index indicates moderate right lower extremity arterial disease. The right toe-brachial index is abnormal. Left: Resting left ankle-brachial index is within normal range. No evidence of significant left lower extremity arterial disease. The left toe-brachial index is normal.  *See table(s) above for measurements and observations.  Electronically signed by Leotis Pain MD on 11/30/2020 at 1:24:02 PM.   Final    Assessment/Plan The patient is a 76 year old female with multiple issues  including known atherosclerotic disease bilateral lower extremity who presents today for scheduled angiogra  1.  Atherosclerotic disease to the right lower extremity: Patient with known history of atherosclerotic disease with gangrenous changes to the toes of the right foot.  Patient with known history of atherosclerotic disease to the bilateral lower extremities.  Recent decrease in right lower extremity ABI.  Patient with gangrenous changes to right toes.  Patient presents today for a scheduled right lower extremity angiogram with possible intervention.  Procedure, risks and benefits were explained to the patient.  All questions were answered.  Patient wishes to proceed.  2. Hyperlipidemia: On aspirin and statin medical management. Also on Plavix Encouraged good control as its slows the progression of atherosclerotic disease  3. Chronic pain syndrome: Multiple medical issues leading to chronic pain issues This is managed by pain management  Discussed with Dr. Mayme Genta, PA-C  12/02/2020 10:19 AM

## 2020-12-02 NOTE — Discharge Instructions (Signed)
Angiogram, Care After This sheet gives you information about how to care for yourself after your procedure. Your health care provider may also give you more specific instructions. If you have problems or questions, contact your health care provider. What can I expect after the procedure? After the procedure, it is common to have:  Bruising and tenderness at the catheter insertion area.  A collection of blood (hematoma) at the insertion area. This may feel like a small lump under the skin at the insertion site. Follow these instructions at home: Insertion site care  Follow instructions from your health care provider about how to take care of your insertion site. Make sure you: ? Wash your hands with soap and water before and after you change your bandage (dressing). If soap and water are not available, use hand sanitizer. ? Change your dressing as told by your health care provider.  Do not take baths, swim, or use a hot tub until your health care provider approves.  You may shower 24-48 hours after the procedure, or as told by your health care provider. To clean the insertion site: ? Gently wash the area with plain soap and water. ? Pat the area dry with a clean towel. ? Do not rub the site. This may cause bleeding.  Check your insertion site every day for signs of infection. Check for: ? Redness, swelling, or pain. ? Fluid or blood. ? Warmth. ? Pus or a bad smell.  Do not apply powder or lotion to the site. Keep the site clean and dry.   Activity  Do not drive for 24 hours if you were given a sedative during your procedure.  Rest as told by your health care provider, usually for 1-2 days.  Do not lift anything that is heavier than 10 lb (4.5 kg), or the limit that you are told, until your health care provider says that it is safe.  If the insertion site was in your leg, try to avoid stairs for a few days.  Return to your normal activities as told by your health care provider,  usually in about a week. Ask your health care provider what activities are safe for you. General instructions  If your insertion site starts bleeding, lie flat and put pressure on the site. If the bleeding does not stop, get help right away. This is a medical emergency.  Take over-the-counter and prescription medicines only as told by your health care provider.  Drink enough fluid to keep your urine pale yellow. This helps flush the contrast dye from your body.  Keep all follow-up visits as told by your health care provider. This is important.   Contact a health care provider if:  You have a fever or chills.  You have redness, swelling, or pain around your insertion site.  You have fluid or blood coming from your insertion site.  Your insertion site feels warm to the touch.  You have pus or a bad smell coming from your insertion site.  You have more bruising around the insertion site. Get help right away if you have:  A problem with the insertion area, such as: ? The area swells fast or bleeds even after you apply pressure. ? The area becomes pale, cool, tingly, or numb.  Chest pain.  Trouble breathing.  A rash.  Any symptoms of a stroke. "BE FAST" is an easy way to remember the main warning signs of a stroke: ? B - Balance. Signs are dizziness, sudden trouble walking,   or loss of balance. ? E - Eyes. Signs are trouble seeing or a sudden change in vision. ? F - Face. Signs are sudden weakness or loss of feeling of the face, or the face or eyelid drooping on one side. ? A - Arms. Signs are weakness or loss of feeling in an arm. This happens suddenly and usually on one side of the body. ? S - Speech. Signs are sudden trouble speaking, slurred speech, or trouble understanding what people say. ? T - Time. Time to call emergency services. Write down what time symptoms started.  You have other signs of a stroke, such as: ? A sudden, severe headache with no known cause. ? Nausea  or vomiting. ? Seizure. These symptoms may represent a serious problem that is an emergency. Do not wait to see if the symptoms will go away. Get medical help right away. Call your local emergency services (911 in the [place] Do not drive yourself to the hospital. Summary  It is common to have bruising and tenderness at the catheter insertion area.  Do not take baths, swim, or use a hot tub until your health care provider approves. You may shower 24-48 hours after the procedure or as told.  It is important to rest and drink plenty of fluids.  If the insertion site bleeds, lie flat and put pressure on the site. If the bleeding continues, get help right away. This is a medical emergency. This information is not intended to replace advice given to you by your health care provider. Make sure you discuss any questions you have with your health care provider. Document Revised: 09/10/2019 Document Reviewed: 09/10/2019 Elsevier Patient Education  2021 [employer]  

## 2020-12-03 ENCOUNTER — Other Ambulatory Visit (INDEPENDENT_AMBULATORY_CARE_PROVIDER_SITE_OTHER): Payer: Self-pay | Admitting: Nurse Practitioner

## 2020-12-03 ENCOUNTER — Telehealth (INDEPENDENT_AMBULATORY_CARE_PROVIDER_SITE_OTHER): Payer: Self-pay

## 2020-12-03 MED ORDER — HYDROCODONE-ACETAMINOPHEN 5-325 MG PO TABS: 1.0000 | ORAL_TABLET | Freq: Four times a day (QID) | ORAL | 0 refills | Status: DC | PRN

## 2020-12-03 NOTE — Telephone Encounter (Signed)
Patient's caregiver called stating the patient hs pain across the top of her foot that is a 9 on a scale from 0-10. Patient is not having any other symptoms but states she is unable to walk or sleep. Patient had a right leg angio with Dr. Lucky Cowboy on 12/01/20. Please advise.

## 2020-12-03 NOTE — Telephone Encounter (Signed)
Spoke with the patient's caregiver Kennyth Lose and gave the recommendations from the NP Eulogio Ditch. The caregiver understood.

## 2020-12-03 NOTE — Telephone Encounter (Signed)
This is likely related to post procedure reperfusion pain.  The patient had extensive work done and following the procedure, there can be swelling and discomfort.  The patient also has a gangrenous toe and that can be causing some pain as well.   sHe should be elevating her leg as much as possible.  I will also call in some pain medication

## 2020-12-08 NOTE — Telephone Encounter (Signed)
Dorita Sciara (caretaker) calling in regarding pt - no information given due to Ms. Johnson not being on DPR - Ms. Wynetta Emery states that pt is in pain and needs something stronger than what was given prior - please advise Ms. Folger - caller also states that pt is very hard of hearing so we must speak loudly

## 2020-12-13 ENCOUNTER — Other Ambulatory Visit (INDEPENDENT_AMBULATORY_CARE_PROVIDER_SITE_OTHER): Payer: Self-pay | Admitting: Nurse Practitioner

## 2020-12-14 ENCOUNTER — Other Ambulatory Visit (INDEPENDENT_AMBULATORY_CARE_PROVIDER_SITE_OTHER): Payer: Self-pay | Admitting: Vascular Surgery

## 2020-12-14 DIAGNOSIS — I70261 Atherosclerosis of native arteries of extremities with gangrene, right leg: Secondary | ICD-10-CM

## 2020-12-14 DIAGNOSIS — Z95820 Peripheral vascular angioplasty status with implants and grafts: Secondary | ICD-10-CM

## 2020-12-16 ENCOUNTER — Encounter (INDEPENDENT_AMBULATORY_CARE_PROVIDER_SITE_OTHER): Payer: Medicare HMO

## 2020-12-16 ENCOUNTER — Ambulatory Visit (INDEPENDENT_AMBULATORY_CARE_PROVIDER_SITE_OTHER): Payer: Medicare HMO | Admitting: Nurse Practitioner

## 2020-12-20 ENCOUNTER — Telehealth (INDEPENDENT_AMBULATORY_CARE_PROVIDER_SITE_OTHER): Payer: Self-pay

## 2020-12-20 NOTE — Telephone Encounter (Signed)
Patient's caregiver left a message for a return call about the patients toe. I called and spoke with Kennyth Lose the caregiver and she stated the patient's toe looks like it has gotten darker and the patient is complaining of extreme pain and wanted to be seen tomorrow. I explained we do not have any openings this week due to having only one provider at the hospital and one provider in the office. Patient had an appt on 12/16/20 and per the note canceled due to upset stomach. Per the caregiver the patient was having pain in her foot and leg as to why she canceled the appt which was for the pain the patient is experiencing. I advised that the patient could go to the ED or Urgent care to be evaluated and was told the patient would not do that. Patient has a f/u appt In our office on 12/29/20 for ABI's and visit.

## 2020-12-22 ENCOUNTER — Other Ambulatory Visit: Payer: Self-pay

## 2020-12-22 ENCOUNTER — Other Ambulatory Visit (INDEPENDENT_AMBULATORY_CARE_PROVIDER_SITE_OTHER): Payer: Self-pay | Admitting: Vascular Surgery

## 2020-12-22 ENCOUNTER — Inpatient Hospital Stay: Payer: Medicare HMO

## 2020-12-22 ENCOUNTER — Encounter: Payer: Self-pay | Admitting: Emergency Medicine

## 2020-12-22 ENCOUNTER — Emergency Department: Payer: Medicare HMO

## 2020-12-22 ENCOUNTER — Inpatient Hospital Stay
Admission: EM | Admit: 2020-12-22 | Discharge: 2021-01-01 | DRG: 503 | Disposition: A | Discharge status: Home Health | Payer: Medicare HMO | Attending: Internal Medicine | Admitting: Internal Medicine

## 2020-12-22 DIAGNOSIS — I70263 Atherosclerosis of native arteries of extremities with gangrene, bilateral legs: Secondary | ICD-10-CM | POA: Diagnosis present

## 2020-12-22 DIAGNOSIS — R571 Hypovolemic shock: Secondary | ICD-10-CM | POA: Diagnosis not present

## 2020-12-22 DIAGNOSIS — M79674 Pain in right toe(s): Secondary | ICD-10-CM | POA: Diagnosis not present

## 2020-12-22 DIAGNOSIS — Z23 Encounter for immunization: Secondary | ICD-10-CM | POA: Diagnosis not present

## 2020-12-22 DIAGNOSIS — F419 Anxiety disorder, unspecified: Secondary | ICD-10-CM | POA: Diagnosis present

## 2020-12-22 DIAGNOSIS — Z7982 Long term (current) use of aspirin: Secondary | ICD-10-CM | POA: Diagnosis not present

## 2020-12-22 DIAGNOSIS — F17200 Nicotine dependence, unspecified, uncomplicated: Secondary | ICD-10-CM | POA: Diagnosis present

## 2020-12-22 DIAGNOSIS — Z9841 Cataract extraction status, right eye: Secondary | ICD-10-CM

## 2020-12-22 DIAGNOSIS — J449 Chronic obstructive pulmonary disease, unspecified: Secondary | ICD-10-CM | POA: Diagnosis present

## 2020-12-22 DIAGNOSIS — R451 Restlessness and agitation: Secondary | ICD-10-CM | POA: Diagnosis present

## 2020-12-22 DIAGNOSIS — Z9842 Cataract extraction status, left eye: Secondary | ICD-10-CM

## 2020-12-22 DIAGNOSIS — E785 Hyperlipidemia, unspecified: Secondary | ICD-10-CM | POA: Diagnosis present

## 2020-12-22 DIAGNOSIS — F32A Depression, unspecified: Secondary | ICD-10-CM | POA: Diagnosis present

## 2020-12-22 DIAGNOSIS — Z7902 Long term (current) use of antithrombotics/antiplatelets: Secondary | ICD-10-CM | POA: Diagnosis not present

## 2020-12-22 DIAGNOSIS — M86171 Other acute osteomyelitis, right ankle and foot: Secondary | ICD-10-CM | POA: Diagnosis not present

## 2020-12-22 DIAGNOSIS — Z79899 Other long term (current) drug therapy: Secondary | ICD-10-CM

## 2020-12-22 DIAGNOSIS — M869 Osteomyelitis, unspecified: Principal | ICD-10-CM | POA: Diagnosis present

## 2020-12-22 DIAGNOSIS — H9190 Unspecified hearing loss, unspecified ear: Secondary | ICD-10-CM | POA: Diagnosis present

## 2020-12-22 DIAGNOSIS — M79606 Pain in leg, unspecified: Secondary | ICD-10-CM

## 2020-12-22 DIAGNOSIS — I748 Embolism and thrombosis of other arteries: Secondary | ICD-10-CM | POA: Diagnosis present

## 2020-12-22 DIAGNOSIS — F17210 Nicotine dependence, cigarettes, uncomplicated: Secondary | ICD-10-CM | POA: Diagnosis present

## 2020-12-22 DIAGNOSIS — Z8711 Personal history of peptic ulcer disease: Secondary | ICD-10-CM | POA: Diagnosis not present

## 2020-12-22 DIAGNOSIS — Z20822 Contact with and (suspected) exposure to covid-19: Secondary | ICD-10-CM | POA: Diagnosis present

## 2020-12-22 DIAGNOSIS — E876 Hypokalemia: Secondary | ICD-10-CM | POA: Diagnosis present

## 2020-12-22 DIAGNOSIS — M48061 Spinal stenosis, lumbar region without neurogenic claudication: Secondary | ICD-10-CM | POA: Diagnosis present

## 2020-12-22 DIAGNOSIS — I96 Gangrene, not elsewhere classified: Secondary | ICD-10-CM | POA: Diagnosis not present

## 2020-12-22 DIAGNOSIS — Z89421 Acquired absence of other right toe(s): Secondary | ICD-10-CM | POA: Diagnosis not present

## 2020-12-22 DIAGNOSIS — Z7901 Long term (current) use of anticoagulants: Secondary | ICD-10-CM

## 2020-12-22 DIAGNOSIS — G5600 Carpal tunnel syndrome, unspecified upper limb: Secondary | ICD-10-CM | POA: Diagnosis present

## 2020-12-22 DIAGNOSIS — E039 Hypothyroidism, unspecified: Secondary | ICD-10-CM | POA: Diagnosis present

## 2020-12-22 DIAGNOSIS — D62 Acute posthemorrhagic anemia: Secondary | ICD-10-CM | POA: Diagnosis not present

## 2020-12-22 DIAGNOSIS — K219 Gastro-esophageal reflux disease without esophagitis: Secondary | ICD-10-CM | POA: Diagnosis present

## 2020-12-22 DIAGNOSIS — M009 Pyogenic arthritis, unspecified: Secondary | ICD-10-CM | POA: Diagnosis present

## 2020-12-22 DIAGNOSIS — R0902 Hypoxemia: Secondary | ICD-10-CM

## 2020-12-22 DIAGNOSIS — I739 Peripheral vascular disease, unspecified: Secondary | ICD-10-CM | POA: Diagnosis present

## 2020-12-22 DIAGNOSIS — Z720 Tobacco use: Secondary | ICD-10-CM | POA: Diagnosis not present

## 2020-12-22 DIAGNOSIS — Z981 Arthrodesis status: Secondary | ICD-10-CM

## 2020-12-22 DIAGNOSIS — Z961 Presence of intraocular lens: Secondary | ICD-10-CM | POA: Diagnosis present

## 2020-12-22 DIAGNOSIS — L03115 Cellulitis of right lower limb: Secondary | ICD-10-CM | POA: Diagnosis present

## 2020-12-22 DIAGNOSIS — I70261 Atherosclerosis of native arteries of extremities with gangrene, right leg: Secondary | ICD-10-CM | POA: Diagnosis not present

## 2020-12-22 DIAGNOSIS — Z9049 Acquired absence of other specified parts of digestive tract: Secondary | ICD-10-CM

## 2020-12-22 DIAGNOSIS — G894 Chronic pain syndrome: Secondary | ICD-10-CM | POA: Diagnosis present

## 2020-12-22 DIAGNOSIS — R579 Shock, unspecified: Secondary | ICD-10-CM | POA: Diagnosis not present

## 2020-12-22 DIAGNOSIS — Z79890 Hormone replacement therapy (postmenopausal): Secondary | ICD-10-CM

## 2020-12-22 DIAGNOSIS — I10 Essential (primary) hypertension: Secondary | ICD-10-CM | POA: Diagnosis present

## 2020-12-22 LAB — SURGICAL PCR SCREEN
MRSA, PCR: NEGATIVE
Staphylococcus aureus: NEGATIVE

## 2020-12-22 LAB — CBC WITH DIFFERENTIAL/PLATELET
Abs Immature Granulocytes: 0.04 10*3/uL (ref 0.00–0.07)
Basophils Absolute: 0.1 10*3/uL (ref 0.0–0.1)
Basophils Relative: 1 %
Eosinophils Absolute: 0.1 10*3/uL (ref 0.0–0.5)
Eosinophils Relative: 1 %
HCT: 34.7 % — ABNORMAL LOW (ref 36.0–46.0)
Hemoglobin: 11 g/dL — ABNORMAL LOW (ref 12.0–15.0)
Immature Granulocytes: 0 %
Lymphocytes Relative: 14 %
Lymphs Abs: 1.4 10*3/uL (ref 0.7–4.0)
MCH: 27.8 pg (ref 26.0–34.0)
MCHC: 31.7 g/dL (ref 30.0–36.0)
MCV: 87.8 fL (ref 80.0–100.0)
Monocytes Absolute: 0.5 10*3/uL (ref 0.1–1.0)
Monocytes Relative: 5 %
Neutro Abs: 7.8 10*3/uL — ABNORMAL HIGH (ref 1.7–7.7)
Neutrophils Relative %: 79 %
Platelets: 557 10*3/uL — ABNORMAL HIGH (ref 150–400)
RBC: 3.95 MIL/uL (ref 3.87–5.11)
RDW: 13.8 % (ref 11.5–15.5)
WBC: 10 10*3/uL (ref 4.0–10.5)
nRBC: 0 % (ref 0.0–0.2)

## 2020-12-22 LAB — PREALBUMIN: Prealbumin: 10.6 mg/dL — ABNORMAL LOW (ref 18–38)

## 2020-12-22 LAB — HIV ANTIBODY (ROUTINE TESTING W REFLEX): HIV Screen 4th Generation wRfx: NONREACTIVE

## 2020-12-22 LAB — C-REACTIVE PROTEIN
CRP: 2.1 mg/dL — ABNORMAL HIGH (ref <1.0)
CRP: 1.7 mg/dL — ABNORMAL HIGH (ref <1.0)

## 2020-12-22 LAB — COMPREHENSIVE METABOLIC PANEL
ALT: 10 U/L (ref 0–44)
AST: 15 U/L (ref 15–41)
Albumin: 3.2 g/dL — ABNORMAL LOW (ref 3.5–5.0)
Alkaline Phosphatase: 76 U/L (ref 38–126)
Anion gap: 12 (ref 5–15)
BUN: 15 mg/dL (ref 8–23)
CO2: 24 mmol/L (ref 22–32)
Calcium: 8.6 mg/dL — ABNORMAL LOW (ref 8.9–10.3)
Chloride: 105 mmol/L (ref 98–111)
Creatinine, Ser: 0.8 mg/dL (ref 0.44–1.00)
GFR, Estimated: >60 mL/min (ref >60)
Glucose, Bld: 110 mg/dL — ABNORMAL HIGH (ref 70–99)
Potassium: 3 mmol/L — ABNORMAL LOW (ref 3.5–5.1)
Sodium: 141 mmol/L (ref 135–145)
Total Bilirubin: 0.4 mg/dL (ref 0.3–1.2)
Total Protein: 6.9 g/dL (ref 6.5–8.1)

## 2020-12-22 LAB — SEDIMENTATION RATE
Sed Rate: 47 mm/hr — ABNORMAL HIGH (ref 0–30)
Sed Rate: 42 mm/hr — ABNORMAL HIGH (ref 0–30)

## 2020-12-22 LAB — SARS CORONAVIRUS 2 BY RT PCR (HOSPITAL ORDER, PERFORMED IN ~~LOC~~ HOSPITAL LAB): SARS Coronavirus 2: NEGATIVE

## 2020-12-22 LAB — LACTIC ACID, PLASMA: Lactic Acid, Venous: 1.2 mmol/L (ref 0.5–1.9)

## 2020-12-22 MED ORDER — ESCITALOPRAM OXALATE 10 MG PO TABS
10.0000 mg | ORAL_TABLET | Freq: Every day | ORAL | Status: DC
  Administered 2020-12-22 – 2021-01-01 (×9): 10 mg via ORAL
  Filled 2020-12-22 (×13): qty 1

## 2020-12-22 MED ORDER — ONDANSETRON HCL 4 MG/2ML IJ SOLN
4.0000 mg | Freq: Once | INTRAMUSCULAR | Status: AC
  Administered 2020-12-22: 4 mg via INTRAVENOUS
  Filled 2020-12-22: qty 2

## 2020-12-22 MED ORDER — LEVOTHYROXINE SODIUM 88 MCG PO TABS
88.0000 ug | ORAL_TABLET | Freq: Every day | ORAL | Status: DC
  Administered 2020-12-24 – 2021-01-01 (×8): 88 ug via ORAL
  Filled 2020-12-22 (×11): qty 1

## 2020-12-22 MED ORDER — OXYCODONE-ACETAMINOPHEN 5-325 MG PO TABS
1.0000 | ORAL_TABLET | Freq: Once | ORAL | Status: AC
  Administered 2020-12-22: 1 via ORAL
  Filled 2020-12-22: qty 1

## 2020-12-22 MED ORDER — ONDANSETRON HCL 4 MG/2ML IJ SOLN
4.0000 mg | Freq: Four times a day (QID) | INTRAMUSCULAR | Status: DC | PRN
  Administered 2020-12-23 – 2020-12-26 (×3): 4 mg via INTRAVENOUS
  Filled 2020-12-22 (×3): qty 2

## 2020-12-22 MED ORDER — TIZANIDINE HCL 2 MG PO TABS
2.0000 mg | ORAL_TABLET | Freq: Three times a day (TID) | ORAL | Status: DC | PRN
  Filled 2020-12-22: qty 1

## 2020-12-22 MED ORDER — POTASSIUM CHLORIDE CRYS ER 20 MEQ PO TBCR
40.0000 meq | EXTENDED_RELEASE_TABLET | Freq: Two times a day (BID) | ORAL | Status: AC
  Administered 2020-12-22 (×2): 40 meq via ORAL
  Filled 2020-12-22 (×2): qty 2

## 2020-12-22 MED ORDER — OXYCODONE HCL 5 MG PO TABS: 5.0000 mg | ORAL_TABLET | Freq: Two times a day (BID) | ORAL | Status: DC | PRN

## 2020-12-22 MED ORDER — AMLODIPINE BESYLATE 5 MG PO TABS
5.0000 mg | ORAL_TABLET | Freq: Every day | ORAL | Status: DC
  Administered 2020-12-22 – 2020-12-24 (×2): 5 mg via ORAL
  Filled 2020-12-22 (×3): qty 1

## 2020-12-22 MED ORDER — ATORVASTATIN CALCIUM 10 MG PO TABS
10.0000 mg | ORAL_TABLET | Freq: Every day | ORAL | Status: DC
  Administered 2020-12-22 – 2021-01-01 (×9): 10 mg via ORAL
  Filled 2020-12-22 (×11): qty 1

## 2020-12-22 MED ORDER — ALPRAZOLAM 0.5 MG PO TABS
1.0000 mg | ORAL_TABLET | Freq: Two times a day (BID) | ORAL | Status: DC | PRN
  Administered 2020-12-22 – 2020-12-28 (×5): 1 mg via ORAL
  Filled 2020-12-22 (×5): qty 2

## 2020-12-22 MED ORDER — CLOPIDOGREL BISULFATE 75 MG PO TABS
75.0000 mg | ORAL_TABLET | Freq: Every day | ORAL | Status: DC
  Administered 2020-12-22: 75 mg via ORAL
  Filled 2020-12-22: qty 1

## 2020-12-22 MED ORDER — MOMETASONE FURO-FORMOTEROL FUM 200-5 MCG/ACT IN AERO
2.0000 | INHALATION_SPRAY | Freq: Two times a day (BID) | RESPIRATORY_TRACT | Status: DC
  Administered 2020-12-23 – 2021-01-01 (×17): 2 via RESPIRATORY_TRACT
  Filled 2020-12-22 (×2): qty 8.8

## 2020-12-22 MED ORDER — MORPHINE SULFATE (PF) 2 MG/ML IV SOLN
2.0000 mg | INTRAVENOUS | Status: DC | PRN
  Administered 2020-12-22 – 2020-12-23 (×3): 2 mg via INTRAVENOUS
  Filled 2020-12-22 (×3): qty 1

## 2020-12-22 MED ORDER — ASPIRIN EC 81 MG PO TBEC
81.0000 mg | DELAYED_RELEASE_TABLET | Freq: Every day | ORAL | Status: DC
  Administered 2020-12-22: 81 mg via ORAL
  Filled 2020-12-22: qty 1

## 2020-12-22 MED ORDER — SODIUM CHLORIDE 0.9 % IV SOLN
2.0000 g | Freq: Once | INTRAVENOUS | Status: AC
  Administered 2020-12-22: 2 g via INTRAVENOUS
  Filled 2020-12-22: qty 2

## 2020-12-22 MED ORDER — MONTELUKAST SODIUM 10 MG PO TABS
10.0000 mg | ORAL_TABLET | Freq: Every day | ORAL | Status: DC
  Administered 2020-12-22 – 2021-01-01 (×9): 10 mg via ORAL
  Filled 2020-12-22 (×11): qty 1

## 2020-12-22 MED ORDER — ONDANSETRON HCL 4 MG PO TABS
4.0000 mg | ORAL_TABLET | Freq: Four times a day (QID) | ORAL | Status: DC | PRN
  Filled 2020-12-22 (×2): qty 1

## 2020-12-22 MED ORDER — TRAZODONE HCL 100 MG PO TABS
200.0000 mg | ORAL_TABLET | Freq: Every day | ORAL | Status: DC
Start: 2020-12-22 — End: 2021-01-01
  Administered 2020-12-22 – 2020-12-31 (×10): 200 mg via ORAL
  Filled 2020-12-22 (×10): qty 2

## 2020-12-22 MED ORDER — METRONIDAZOLE IN NACL 5-0.79 MG/ML-% IV SOLN
500.0000 mg | Freq: Three times a day (TID) | INTRAVENOUS | Status: DC
  Administered 2020-12-22 – 2020-12-23 (×3): 500 mg via INTRAVENOUS
  Filled 2020-12-22 (×6): qty 100

## 2020-12-22 MED ORDER — SODIUM CHLORIDE 0.9 % IV SOLN
2.0000 g | INTRAVENOUS | Status: DC
  Administered 2020-12-22 – 2020-12-23 (×2): 2 g via INTRAVENOUS
  Filled 2020-12-22: qty 20
  Filled 2020-12-22 (×2): qty 2

## 2020-12-22 MED ORDER — MORPHINE SULFATE (PF) 4 MG/ML IV SOLN
5.0000 mg | Freq: Once | INTRAVENOUS | Status: AC
  Administered 2020-12-22: 5 mg via INTRAVENOUS
  Filled 2020-12-22: qty 2

## 2020-12-22 MED ORDER — IPRATROPIUM-ALBUTEROL 0.5-2.5 (3) MG/3ML IN SOLN
3.0000 mL | RESPIRATORY_TRACT | Status: DC | PRN
  Administered 2020-12-22 – 2020-12-23 (×2): 3 mL via RESPIRATORY_TRACT
  Filled 2020-12-22: qty 3

## 2020-12-22 MED ORDER — SODIUM CHLORIDE 0.9 % IV SOLN: INTRAVENOUS | Status: AC

## 2020-12-22 MED ORDER — PNEUMOCOCCAL VAC POLYVALENT 25 MCG/0.5ML IJ INJ: 0.5000 mL | INJECTION | INTRAMUSCULAR | Status: DC

## 2020-12-22 MED ORDER — MORPHINE SULFATE (PF) 4 MG/ML IV SOLN
4.0000 mg | INTRAVENOUS | Status: DC | PRN
  Filled 2020-12-22: qty 1

## 2020-12-22 MED ORDER — SODIUM CHLORIDE 0.9 % IV BOLUS
500.0000 mL | Freq: Once | INTRAVENOUS | Status: AC
  Administered 2020-12-22: 500 mL via INTRAVENOUS

## 2020-12-22 MED ORDER — NICOTINE 14 MG/24HR TD PT24
14.0000 mg | MEDICATED_PATCH | Freq: Every day | TRANSDERMAL | Status: DC
  Administered 2020-12-22 – 2021-01-01 (×10): 14 mg via TRANSDERMAL
  Filled 2020-12-22 (×12): qty 1

## 2020-12-22 MED ORDER — BUPROPION HCL ER (SR) 150 MG PO TB12
150.0000 mg | ORAL_TABLET | Freq: Every day | ORAL | Status: DC
  Administered 2020-12-22 – 2021-01-01 (×9): 150 mg via ORAL
  Filled 2020-12-22 (×14): qty 1

## 2020-12-22 MED ORDER — VANCOMYCIN HCL IN DEXTROSE 1-5 GM/200ML-% IV SOLN
1000.0000 mg | Freq: Once | INTRAVENOUS | Status: AC
  Administered 2020-12-22: 1000 mg via INTRAVENOUS
  Filled 2020-12-22: qty 200

## 2020-12-22 MED ORDER — INFLUENZA VAC A&B SA ADJ QUAD 0.5 ML IM PRSY
0.5000 mL | PREFILLED_SYRINGE | INTRAMUSCULAR | Status: DC
  Filled 2020-12-22: qty 0.5

## 2020-12-22 MED ORDER — VANCOMYCIN HCL IN DEXTROSE 1-5 GM/200ML-% IV SOLN
1000.0000 mg | INTRAVENOUS | Status: DC
  Filled 2020-12-22 (×2): qty 200

## 2020-12-22 NOTE — Consult Note (Signed)
ORTHOPAEDIC CONSULTATION  REQUESTING PHYSICIAN: Collier Bullock, MD  Chief Complaint: Pain with gangrenous toe right foot  HPI: [name] is a 75 y.o. female who complains of worsening pain to her right foot.  She has longstanding history of peripheral vascular disease to this right leg.  Most recently underwent intervention by vascular surgery on 13 January.  Has been seen by vascular surgery and they feel that the circulation is stable from previous.  They are ordering ABIs at this time.  She has had longstanding gangrenous changes to the third toe but it has definitely become more progressively painful of recent.  Past Medical History:  Diagnosis Date  . Anxiety   . CAD (coronary artery disease)   . Carpal tunnel syndrome   . Cataract   . Complication of anesthesia    has woken up at times  . COPD (chronic obstructive pulmonary disease) ([place])   . CRP elevated 09/14/2015  . Depression   . Difficult intubation   . Dyslipidemia   . Dyspnea    DOE  . Dysrhythmia    hx palpatations  . Elevated sedimentation rate 09/14/2015  . Esophageal spasm   . Gastrointestinal parasites   . GERD (gastroesophageal reflux disease)   . Hiatal hernia   . History of peptic ulcer disease   . Hyperthyroidism   . Hypothyroidism   . Low magnesium levels 09/14/2015  . Pelvic fracture ([place]) 2008   fall from riding a horse  . Reflux   . Rotator cuff injury   . Stenosis, spinal, lumbar    Past Surgical History:  Procedure Laterality Date  . APPENDECTOMY    . BACK SURGERY     CERVICAL FUSION  . CARPAL TUNNEL RELEASE    . CATARACT EXTRACTION W/PHACO Right 08/07/2017   Procedure: CATARACT EXTRACTION PHACO AND INTRAOCULAR LENS PLACEMENT (IOC);  Surgeon: Birder Robson, MD;  Location: [facility];  Service: Ophthalmology;  Laterality: Right;  [place] 00:52.0 AP% 16.8 CDE 8.74 Fluid Pack Lot # [id number]  . CATARACT EXTRACTION W/PHACO Left 09/04/2017   Procedure: CATARACT EXTRACTION PHACO AND  INTRAOCULAR LENS PLACEMENT (IOC);  Surgeon: Birder Robson, MD;  Location: [facility];  Service: Ophthalmology;  Laterality: Left;  [place] 00:34 AP% 17.0 CDE 5.80 Fluid pack lot # [id number]  . CESAREAN SECTION    . CHOLECYSTECTOMY    . FRACTURE SURGERY    . HIP SURGERY    . INTRAMEDULLARY (IM) NAIL INTERTROCHANTERIC Right 10/01/2016   Procedure: INTRAMEDULLARY (IM) NAIL INTERTROCHANTRIC;  Surgeon: Corky Mull, MD;  Location: [facility];  Service: Orthopedics;  Laterality: Right;  . KYPHOPLASTY N/A 08/20/2018   Procedure: [id number];  Surgeon: Hessie Knows, MD;  Location: [facility];  Service: Orthopedics;  Laterality: N/A;  . LOWER EXTREMITY ANGIOGRAPHY Right 12/18/2019   Procedure: LOWER EXTREMITY ANGIOGRAPHY;  Surgeon: Algernon Huxley, MD;  Location: [facility];  Service: Cardiovascular;  Laterality: Right;  . LOWER EXTREMITY ANGIOGRAPHY Right 12/02/2020   Procedure: LOWER EXTREMITY ANGIOGRAPHY;  Surgeon: Algernon Huxley, MD;  Location: [facility];  Service: Cardiovascular;  Laterality: Right;  . NECK SURGERY    . NOSE SURGERY    . ROTATOR CUFF REPAIR     x2  . SHOULDER ARTHROSCOPY  12/07/2011   Procedure: ARTHROSCOPY SHOULDER;  Surgeon: Ninetta Lights, MD;  Location: [facility];  Service: Orthopedics;  Laterality: Right;  Debridement Partial Cuff Tear, Release Coracoacromial Ligament  . SHOULDER SURGERY  12/07/2011   right  Social History   Socioeconomic History  . Marital status: Divorced    Spouse name: Not on file  . Number of children: Not on file  . Years of education: Not on file  . Highest education level: Not on file  Occupational History  . Occupation: retired Air cabin crew  Tobacco Use  . Smoking status: Current Some Day Smoker    Packs/day: 0.00    Types: Cigarettes  . Smokeless tobacco: Never Used  . Tobacco comment: pt states 2 cigs per month   Substance and Sexual Activity  . Alcohol use: No  . Drug use: No  . Sexual activity:  Never  Other Topics Concern  . Not on file  Social History Narrative  . Not on file   Social Determinants of Health   Financial Resource Strain: Not on file  Food Insecurity: Not on file  Transportation Needs: Not on file  Physical Activity: Not on file  Stress: Not on file  Social Connections: Not on file   Family History  Problem Relation Age of Onset  . Aneurysm Other    No Known Allergies Prior to Admission medications   Medication Sig Start Date End Date Taking? Authorizing Provider  ALPRAZolam Duanne Moron) 0.5 MG tablet Take 0.5 mg by mouth every 6 (six) hours as needed for anxiety.   Yes [provider]  HYDROcodone-acetaminophen (NORCO/VICODIN) 5-325 MG tablet Take 1 tablet by mouth every 6 (six) hours as needed for moderate pain. 12/03/20   Kris Hartmann, NP  ALPRAZolam Duanne Moron) 1 MG tablet Take 1 tablet (1 mg total) by mouth 4 (four) times daily as needed. Patient taking differently: Take 1 mg by mouth 2 (two) times daily as needed. 10/04/16   Epifanio Lesches, MD  amLODipine (NORVASC) 5 MG tablet Take 1 tablet (5 mg total) by mouth daily. 08/22/18   Gladstone Lighter, MD  aspirin EC 81 MG tablet Take 1 tablet (81 mg total) by mouth daily. 12/02/20   Algernon Huxley, MD  atorvastatin (LIPITOR) 10 MG tablet Take 1 tablet (10 mg total) by mouth daily. 12/18/19 12/17/20  Algernon Huxley, MD  buPROPion (WELLBUTRIN SR) 150 MG 12 hr tablet Take 150 mg by mouth daily.     [provider]  clopidogrel (PLAVIX) 75 MG tablet Take 1 tablet (75 mg total) by mouth daily. 12/18/19   Algernon Huxley, MD  escitalopram (LEXAPRO) 10 MG tablet Take 10 mg by mouth daily.  10/15/13   [provider]  Fluticasone-Salmeterol (ADVAIR DISKUS) 250-50 MCG/DOSE AEPB Inhale 1 puff into the lungs 2 (two) times daily. 10/24/18 03/16/20  Dustin Flock, MD  levothyroxine (SYNTHROID, LEVOTHROID) 88 MCG tablet Take 88 mcg by mouth daily before breakfast.     [provider]   montelukast (SINGULAIR) 10 MG tablet Take 10 mg by mouth daily.    [provider]  oxyCODONE (OXY IR/ROXICODONE) 5 MG immediate release tablet Take 1 tablet (5 mg total) by mouth 2 (two) times daily as needed for severe pain. Must last 30 days Patient not taking: Reported on 12/02/2020 11/11/20 12/11/20  Milinda Pointer, MD  oxyCODONE (OXY IR/ROXICODONE) 5 MG immediate release tablet Take 1 tablet (5 mg total) by mouth 2 (two) times daily as needed for severe pain. Must last 30 days 12/11/20 01/10/21  Milinda Pointer, MD  oxyCODONE (OXY IR/ROXICODONE) 5 MG immediate release tablet Take 1 tablet (5 mg total) by mouth 2 (two) times daily as needed for severe pain. Must last 30 days 01/10/21  02/09/21  Milinda Pointer, MD  tizanidine (ZANAFLEX) 2 MG capsule Take 1 capsule (2 mg total) by mouth 3 (three) times daily as needed for muscle spasms. Patient not taking: No sig reported 11/03/20 02/01/21  Milinda Pointer, MD  traZODone (DESYREL) 100 MG tablet Take 1 tablet (100 mg total) by mouth at bedtime. Patient taking differently: Take 200 mg by mouth at bedtime. 08/21/18   Gladstone Lighter, MD   DG Foot Complete Right  Result Date: 12/22/2020 CLINICAL DATA:  Necrotic right third toe.  Gangrene. EXAM: RIGHT FOOT COMPLETE - 3+ VIEW COMPARISON:  12/02/2019 FINDINGS: Erosive changes at the third PIP joint has developed since the prior study. D IP joint intact. No fracture. No other arthropathy. 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 Electronically Signed   By: Franchot Gallo M.D.   On: 12/22/2020 09:11    Positive ROS: All other systems have been reviewed and were otherwise negative with the exception of those mentioned in the HPI and as above.  12 point ROS was performed.  Physical Exam: General: Alert and oriented.  No apparent distress.  Vascular:  Left foot:Dorsalis Pedis:  diminished Posterior Tibial:  diminished  Right  foot: Dorsalis Pedis:  absent Posterior Tibial:  absent  Neuro:intact gross sensation  Derm: Left foot without ulceration or gangrenous changes.  Right foot with gangrenous right third toe.  Mild erythema just proximal to the gangrenous third toe.  Ortho/MS: She demonstrated active dorsi and plantar flexion though she describes weakness and falling of recent.  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  She is undergone recent intervention via vascular surgery and they have evaluated.  I have ordered ABIs but feel that her circulation is likely stable from her most recent intervention.  At this time will recommend amputation of this distal right third toe.  I discussed this with the patient and we will plan to perform on Friday.  I discussed the fact that given her severe circulatory status and the obvious gangrenous changes with some erythema there is a possibility of continued wound complications, infection and possible need for further surgical intervention down the road.  We will follow-up tomorrow for final discussion with the patient.    Elesa Hacker, DPM Cell [phone]   12/22/2020 4:14 PM

## 2020-12-22 NOTE — Consult Note (Signed)
Pharmacy Antibiotic Note  [name] is a 75 y.o. female admitted on 12/22/2020 with gangrene of right third toe. Pt. Has pmh of atherosclerotic disease to the right lower extremity, s/p endovascular intervention on 1/13. Pharmacy has been consulted for vancomycin dosing. Pt has ceftriaxone and metronidazole ordered as well.   Scr is stable (0.80), WBC WNL (10.0), and pt is afebrile.   Plan: Pt received vancomycin 1000mg  IV in ED, start maintenance dose of vancomycin 1000mg  IV every 24 hours.  AUC 477.6 (Goal 400-550), Cmin (10.9). Scr monitoring daily per protocol.  Will follow up with levels when steady state has been reached.  Will follow up with culture data.     Height: 5\' 4"  (162.6 cm) Weight: 59 kg (130 lb 1.1 oz) IBW/kg (Calculated) : 54.7  Temp (24hrs), Avg:98.4 F (36.9 C), Min:98.4 F (36.9 C), Max:98.4 F (36.9 C)  Recent Labs  Lab 12/22/20 0913  WBC 10.0  CREATININE 0.80  LATICACIDVEN 1.2    Estimated Creatinine Clearance: 54.1 mL/min (by C-G formula based on SCr of 0.8 mg/dL).    No Known Allergies  Antimicrobials this admission: 2/2 cefepime x1 2/2 vancomycin >>  2/2 CTX >> 2/2 metronidazole >>    Microbiology results: 2/2 BCx: pending   Thank you for allowing pharmacy to be a part of this patient's care.  [name], Pharmacy Student  12/22/2020 11:39 AM

## 2020-12-22 NOTE — Consult Note (Signed)
PHARMACY -  BRIEF ANTIBIOTIC NOTE   Pharmacy has received consult(s) for cefepime and vancomycin from an ED provider.  The patient's profile has been reviewed for ht/wt/allergies/indication/available labs.    One time order(s) placed for --Vancomycin 1 g IV --Cefepime 2 g IV  Further antibiotics/pharmacy consults should be ordered by admitting physician if indicated.                       Thank you, Benita Gutter 12/22/2020  11:02 AM

## 2020-12-22 NOTE — H&P (Signed)
History and Physical    [name] HYQ:[id number] DOB: December 18, 1945 DOA: 12/22/2020  PCP: Albina Billet, MD   Patient coming from: Home  I have personally briefly reviewed patient's old medical records in [place]  Chief Complaint: Change in mental status  HPI: [name] is a 75 y.o. female with medical history significant for peripheral arterial disease status post recent mechanical thrombectomy of the right distal SFA and popliteal artery as well/ PTCA of the right anterior tibial artery and stent angioplasty of the SFA, popliteal artery and right anterior tibial artery, history of COPD, hypertension who presents to the ER for evaluation of mental status changes as well as worsening pain in her right third toe. Patient had dry gangrene of the right third toe and over the last 1 week she has noticed increased redness, pain and swelling involving that toe.  She rates her pain a 10 x 10 in intensity at its worst and has had to increase the frequency of administration of her pain medicines due to worsening pain.  She now has redness that had extended up to her right ankle associated with chills but denies having any fever.  She denies having any trauma to the area. She denies having any chest pain, no shortness of breath, no nausea, no vomiting, no diaphoresis, no palpitations, no dizziness, no lightheadedness, no abdominal pain, no diarrhea, no constipation, no urinary frequency, no dysuria, no nocturia, no fever, no cough. Labs show sodium 141, potassium 3.0, chloride 105, bicarb 24, glucose 110, BUN 15, creatinine 0.8, calcium 8.6, alkaline phosphatase 76, albumin 3.2, AST 15, ALT 10, total protein 6.9, total bilirubin 0.4, lactic acid 1.2, white count 10.0, hemoglobin 11.0, hematocrit 34.7, MCV 87.8, RDW 13.8, platelet count 557, ESR 47 SARS coronavirus 2 PCR test is negative X-ray of the right foot shows erosive changes of the third PIP compatible with septic arthritis and  osteomyelitis.   ED Course: Patient is a 75 year old female who presents to the emergency room for evaluation of mental status changes as well as worsening pain, swelling and redness involving the third toe on her right foot.  Patient was noted to be tachycardic and tachypneic but has a normal white cell count and normal lactic acid level.  Right foot x-ray shows osteomyelitis/septic arthritis involving the third PIP joint.  Patient received empiric IV antibiotic therapy in the ER.  Consult has been placed to vascular surgery and podiatry.  Patient will be admitted for further evaluation.     Review of Systems: As per HPI otherwise all systems reviewed and negative.    Past Medical History:  Diagnosis Date  . Anxiety   . CAD (coronary artery disease)   . Carpal tunnel syndrome   . Cataract   . Complication of anesthesia    has woken up at times  . COPD (chronic obstructive pulmonary disease) ([place])   . CRP elevated 09/14/2015  . Depression   . Difficult intubation   . Dyslipidemia   . Dyspnea    DOE  . Dysrhythmia    hx palpatations  . Elevated sedimentation rate 09/14/2015  . Esophageal spasm   . Gastrointestinal parasites   . GERD (gastroesophageal reflux disease)   . Hiatal hernia   . History of peptic ulcer disease   . Hyperthyroidism   . Hypothyroidism   . Low magnesium levels 09/14/2015  . Pelvic fracture ([place]) 2008   fall from riding a horse  . Reflux   . Rotator cuff  injury   . Stenosis, spinal, lumbar     Past Surgical History:  Procedure Laterality Date  . APPENDECTOMY    . BACK SURGERY     CERVICAL FUSION  . CARPAL TUNNEL RELEASE    . CATARACT EXTRACTION W/PHACO Right 08/07/2017   Procedure: CATARACT EXTRACTION PHACO AND INTRAOCULAR LENS PLACEMENT (IOC);  Surgeon: Birder Robson, MD;  Location: [facility] ORS;  Service: Ophthalmology;  Laterality: Right;  [place] 00:52.0 AP% 16.8 CDE 8.74 Fluid Pack Lot # [id number]  . CATARACT EXTRACTION W/PHACO Left 09/04/2017    Procedure: CATARACT EXTRACTION PHACO AND INTRAOCULAR LENS PLACEMENT (IOC);  Surgeon: Birder Robson, MD;  Location: [facility];  Service: Ophthalmology;  Laterality: Left;  [place] 00:34 AP% 17.0 CDE 5.80 Fluid pack lot # [id number]  . CESAREAN SECTION    . CHOLECYSTECTOMY    . FRACTURE SURGERY    . HIP SURGERY    . INTRAMEDULLARY (IM) NAIL INTERTROCHANTERIC Right 10/01/2016   Procedure: INTRAMEDULLARY (IM) NAIL INTERTROCHANTRIC;  Surgeon: Corky Mull, MD;  Location: [facility];  Service: Orthopedics;  Laterality: Right;  . KYPHOPLASTY N/A 08/20/2018   Procedure: GXQJJHERDEY-C1;  Surgeon: Hessie Knows, MD;  Location: [facility];  Service: Orthopedics;  Laterality: N/A;  . LOWER EXTREMITY ANGIOGRAPHY Right 12/18/2019   Procedure: LOWER EXTREMITY ANGIOGRAPHY;  Surgeon: Algernon Huxley, MD;  Location: [place] [facility];  Service: Cardiovascular;  Laterality: Right;  . LOWER EXTREMITY ANGIOGRAPHY Right 12/02/2020   Procedure: LOWER EXTREMITY ANGIOGRAPHY;  Surgeon: Algernon Huxley, MD;  Location: [place] [facility];  Service: Cardiovascular;  Laterality: Right;  . NECK SURGERY    . NOSE SURGERY    . ROTATOR CUFF REPAIR     x2  . SHOULDER ARTHROSCOPY  12/07/2011   Procedure: ARTHROSCOPY SHOULDER;  Surgeon: Ninetta Lights, MD;  Location: [place];  Service: Orthopedics;  Laterality: Right;  Debridement Partial Cuff Tear, Release Coracoacromial Ligament  . SHOULDER SURGERY  12/07/2011   right     reports that she has been smoking cigarettes. She has been smoking about 0.00 packs per day. She has never used smokeless tobacco. She reports that she does not drink alcohol and does not use drugs.  No Known Allergies  Family History  Problem Relation Age of Onset  . Aneurysm Other      Prior to Admission medications   Medication Sig Start Date End Date Taking? Authorizing Provider  HYDROcodone-acetaminophen (NORCO/VICODIN) 5-325 MG tablet Take 1 tablet by mouth every 6 (six) hours as  needed for moderate pain. 12/03/20   Kris Hartmann, NP  ALPRAZolam Duanne Moron) 1 MG tablet Take 1 tablet (1 mg total) by mouth 4 (four) times daily as needed. Patient taking differently: Take 1 mg by mouth 2 (two) times daily as needed. 10/04/16   Epifanio Lesches, MD  amLODipine (NORVASC) 5 MG tablet Take 1 tablet (5 mg total) by mouth daily. 08/22/18   Gladstone Lighter, MD  aspirin EC 81 MG tablet Take 1 tablet (81 mg total) by mouth daily. 12/02/20   Algernon Huxley, MD  atorvastatin (LIPITOR) 10 MG tablet Take 1 tablet (10 mg total) by mouth daily. 12/18/19 12/17/20  Algernon Huxley, MD  buPROPion (WELLBUTRIN SR) 150 MG 12 hr tablet Take 150 mg by mouth daily.     [provider]  clopidogrel (PLAVIX) 75 MG tablet Take 1 tablet (75 mg total) by mouth daily. 12/18/19   Algernon Huxley, MD  escitalopram (LEXAPRO) 10 MG tablet Take 10  mg by mouth daily.  10/15/13   [provider]  Fluticasone-Salmeterol (ADVAIR DISKUS) 250-50 MCG/DOSE AEPB Inhale 1 puff into the lungs 2 (two) times daily. 10/24/18 03/16/20  Dustin Flock, MD  Ipratropium-Albuterol (COMBIVENT) 20-100 MCG/ACT AERS respimat Inhale 1 puff into the lungs every 6 (six) hours as needed for wheezing or shortness of breath.    [provider]  ipratropium-albuterol (DUONEB) 0.5-2.5 (3) MG/3ML SOLN Take 3 mLs by nebulization every 4 (four) hours as needed. 10/24/18   Dustin Flock, MD  levothyroxine (SYNTHROID, LEVOTHROID) 88 MCG tablet Take 88 mcg by mouth daily before breakfast.     [provider]  montelukast (SINGULAIR) 10 MG tablet Take 10 mg by mouth daily.    [provider]  oxyCODONE (OXY IR/ROXICODONE) 5 MG immediate release tablet Take 1 tablet (5 mg total) by mouth 2 (two) times daily as needed for severe pain. Must last 30 days Patient not taking: Reported on 12/02/2020 11/11/20 12/11/20  Milinda Pointer, MD  oxyCODONE (OXY IR/ROXICODONE) 5 MG immediate release tablet Take 1 tablet (5 mg  total) by mouth 2 (two) times daily as needed for severe pain. Must last 30 days 12/11/20 01/10/21  Milinda Pointer, MD  oxyCODONE (OXY IR/ROXICODONE) 5 MG immediate release tablet Take 1 tablet (5 mg total) by mouth 2 (two) times daily as needed for severe pain. Must last 30 days 01/10/21 02/09/21  Milinda Pointer, MD  tizanidine (ZANAFLEX) 2 MG capsule Take 1 capsule (2 mg total) by mouth 3 (three) times daily as needed for muscle spasms. 11/03/20 02/01/21  Milinda Pointer, MD  traZODone (DESYREL) 100 MG tablet Take 1 tablet (100 mg total) by mouth at bedtime. Patient taking differently: Take 200 mg by mouth at bedtime. 08/21/18   Gladstone Lighter, MD    Physical Exam: Vitals:   12/22/20 0915 12/22/20 1000 12/22/20 1051 12/22/20 1100  BP: 117/82 (!) 167/77 140/86 (!) 154/86  Pulse: (!) 111 (!) 109 (!) 111 (!) 115  Resp: (!) 23 (!) 24 (!) 24 (!) 30  Temp:      TempSrc:      SpO2: 96% 95% 95% 95%  Weight:      Height:         Vitals:   12/22/20 0915 12/22/20 1000 12/22/20 1051 12/22/20 1100  BP: 117/82 (!) 167/77 140/86 (!) 154/86  Pulse: (!) 111 (!) 109 (!) 111 (!) 115  Resp: (!) 23 (!) 24 (!) 24 (!) 30  Temp:      TempSrc:      SpO2: 96% 95% 95% 95%  Weight:      Height:        Constitutional: NAD, alert and oriented x 3.  Hard of hearing Eyes: PERRL, lids and conjunctivae pallor ENMT: Mucous membranes are moist.  Neck: normal, supple, no masses, no thyromegaly Respiratory: clear to auscultation bilaterally, no wheezing, no crackles. Normal respiratory effort. No accessory muscle use.  Cardiovascular: Tachycardia, no murmurs / rubs / gallops. No extremity edema. 2+ pedal pulses. No carotid bruits.  Abdomen: no tenderness, no masses palpated. No hepatosplenomegaly. Bowel sounds positive.  Musculoskeletal: no clubbing / cyanosis.  Dry gangrene involving the right third toe with redness extending over the dorsum of the right foot and differential warmth Skin: no rashes,  lesions, ulcers.  Neurologic: No gross focal neurologic deficit. Psychiatric: Normal mood and affect.   Labs on Admission: I have personally reviewed following labs and imaging studies  CBC: Recent Labs  Lab 12/22/20 0913  WBC  10.0  NEUTROABS 7.8*  HGB 11.0*  HCT 34.7*  MCV 87.8  PLT 009*   Basic Metabolic Panel: Recent Labs  Lab 12/22/20 0913  NA 141  K 3.0*  CL 105  CO2 24  GLUCOSE 110*  BUN 15  CREATININE 0.80  CALCIUM 8.6*   GFR: Estimated Creatinine Clearance: 54.1 mL/min (by C-G formula based on SCr of 0.8 mg/dL). Liver Function Tests: Recent Labs  Lab 12/22/20 0913  AST 15  ALT 10  ALKPHOS 76  BILITOT 0.4  PROT 6.9  ALBUMIN 3.2*   No results for input(s): LIPASE, AMYLASE in the last 168 hours. No results for input(s): AMMONIA in the last 168 hours. Coagulation Profile: No results for input(s): INR, PROTIME in the last 168 hours. Cardiac Enzymes: No results for input(s): CKTOTAL, CKMB, CKMBINDEX, TROPONINI in the last 168 hours. BNP (last 3 results) No results for input(s): PROBNP in the last 8760 hours. HbA1C: No results for input(s): HGBA1C in the last 72 hours. CBG: No results for input(s): GLUCAP in the last 168 hours. Lipid Profile: No results for input(s): CHOL, HDL, LDLCALC, TRIG, CHOLHDL, LDLDIRECT in the last 72 hours. Thyroid Function Tests: No results for input(s): TSH, T4TOTAL, FREET4, T3FREE, THYROIDAB in the last 72 hours. Anemia Panel: No results for input(s): VITAMINB12, FOLATE, FERRITIN, TIBC, IRON, RETICCTPCT in the last 72 hours. Urine analysis:    Component Value Date/Time   COLORURINE YELLOW (A) 10/21/2018 2212   APPEARANCEUR CLEAR (A) 10/21/2018 2212   LABSPEC 1.029 10/21/2018 2212   PHURINE 6.0 10/21/2018 2212   GLUCOSEU NEGATIVE 10/21/2018 2212   HGBUR NEGATIVE 10/21/2018 2212   BILIRUBINUR NEGATIVE 10/21/2018 2212   KETONESUR 20 (A) 10/21/2018 2212   PROTEINUR NEGATIVE 10/21/2018 2212   NITRITE NEGATIVE  10/21/2018 2212   LEUKOCYTESUR NEGATIVE 10/21/2018 2212    Radiological Exams on Admission: DG Foot Complete Right  Result Date: 12/22/2020 CLINICAL DATA:  Necrotic right third toe.  Gangrene. EXAM: RIGHT FOOT COMPLETE - 3+ VIEW COMPARISON:  12/02/2019 FINDINGS: Erosive changes at the third PIP joint has developed since the prior study. D IP joint intact. No fracture. No other arthropathy. 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 Electronically Signed   By: Franchot Gallo M.D.   On: 12/22/2020 09:11    EKG: Independently reviewed.   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 (HCC) Active Problems:   Hypothyroidism   Tobacco use disorder   Anxiety and depression   Dry gangrene (HCC) of middle toe, right foot   PAD (peripheral artery disease) (HCC)    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 Patient has known peripheral arterial disease with dry gangrene involving the right third toe She presents for evaluation of worsening pain, swelling and redness involving the third right toe and extending to the dorsum of the right foot Imaging shows osteomyelitis/septic arthritis involving the proximal interphalangeal joint of the right third toe We will treat patient empirically with vancomycin and Rocephin We will consult podiatry and vascular surgery for further evaluation and recommendation   Peripheral vascular disease Status post recent PTCA and stent angioplasty of the right SFA/popliteal/anterior tibial artery Continue aspirin, Plavix and statins    Nicotine dependence Smoking cessation has been discussed with patient in detail We will place patient on a nicotine transdermal patch 14 mg daily    Depression and anxiety Continue Lexapro, trazodone, bupropion and Xanax    Hypothyroidism Continue Synthroid  History of COPD Not acutely exacerbated Continue as needed bronchodilator  therapy as well as inhaled steroids   Hypertension Continue amlodipine    Hypokalemia Supplement potassium   DVT prophylaxis: SCD Code Status: Full code Family Communication: Greater than 50% of time was spent discussing patient's condition and plan of care with her at the bedside.  All questions and concerns have been addressed.  She verbalizes understanding and agrees with the plan. Disposition Plan: Back to previous home environment Consults called: Podiatry/vascular surgery    Shalimar Mcclain MD [facility]     12/22/2020, 12:19 PM

## 2020-12-22 NOTE — ED Triage Notes (Signed)
Pt to ED via POV with caregiver with c/o necrotic R 3rd toe, noted to be black in triage. Per caregiver pt also c/o pain to toe. Pt's caregiver also c/o confusion x 3 days.

## 2020-12-22 NOTE — Consult Note (Signed)
[facility] Daily Progress Note  Subjective: 12/02/20: 1. Ultrasound guidance for vascular access left femoral artery 2. Catheter placement into right common femoral artery from left femoral approach 3. Aortogram and selective right lower extremity angiogram 4. mechanical thrombectomy of the right distal SFA and popliteal artery with the [place] device 5. percutaneous transluminal angioplasty of the right anterior tibial artery with 3 mm diameter angioplasty balloon             6.  Viabahn stent placement to the right most distal SFA and popliteal artery with 6 mm diameter by 15 cm length stent for residual stenosis and thrombus after above procedures             7. Viabahn stent placement to the right anterior tibial artery with 5 mm diameter by 7.5 cm length stent 8. StarClose closure device left femoral artery  Patient with recent intervention to the right lower extremity.  Patient presents today with progressively worsening foot pain.  Patient states gangrenous changes which were located to the tip of her toe have now progressed proximally.  Denies any fever, nausea or vomiting.  Objective: Vitals:   12/22/20 0915 12/22/20 1000 12/22/20 1051 12/22/20 1100  BP: 117/82 (!) 167/77 140/86 (!) 154/86  Pulse: (!) 111 (!) 109 (!) 111 (!) 115  Resp: (!) 23 (!) 24 (!) 24 (!) 30  Temp:      TempSrc:      SpO2: 96% 95% 95% 95%  Weight:      Height:        Intake/Output Summary (Last 24 hours) at 12/22/2020 1449 Last data filed at 12/22/2020 1052 Gross per 24 hour  Intake 500 ml  Output --  Net 500 ml   Physical Exam: A&Ox3, NAD CV: RRR Pulmonary: CTA Bilaterally Abdomen: Soft, Nontender, Nondistended Vascular:  Right lower extremity: Thigh soft.  Calf soft.  Extremities warm distally to toes.  Hard to palpate pedal pulses however the foot is warm.  Gangrenous changes noted to the fourth  toe. Dry. No drainage.   Laboratory: CBC    Component Value Date/Time   WBC 10.0 12/22/2020 0913   HGB 11.0 (L) 12/22/2020 0913   HCT 34.7 (L) 12/22/2020 0913   PLT 557 (H) 12/22/2020 0913   BMET    Component Value Date/Time   NA 141 12/22/2020 0913   NA 143 10/06/2019 1552   NA 138 02/10/2014 1335   K 3.0 (L) 12/22/2020 0913   K 4.5 02/10/2014 1335   CL 105 12/22/2020 0913   CL 106 02/10/2014 1335   CO2 24 12/22/2020 0913   CO2 28 02/10/2014 1335   GLUCOSE 110 (H) 12/22/2020 0913   GLUCOSE 99 02/10/2014 1335   BUN 15 12/22/2020 0913   BUN 7 (L) 10/06/2019 1552   BUN 6 (L) 02/10/2014 1335   CREATININE 0.80 12/22/2020 0913   CREATININE 0.89 02/10/2014 1335   CALCIUM 8.6 (L) 12/22/2020 0913   CALCIUM 8.7 02/10/2014 1335   GFRNONAA >60 12/22/2020 0913   GFRNONAA >60 02/10/2014 1335   GFRAA >60 12/18/2019 1012   GFRAA >60 02/10/2014 1335   Assessment/Planning: The patient is a 75 year old female with known history of atherosclerotic disease with gangrenous changes to the right foot status post endovascular intervention on December 02, 2020 who presents today with progressively worsening right foot pain  1) patient underwent right lower extremity angiogram with intervention on December 02, 2020.  On exam the patient's extremity is warm distally to  the toes.  I do not feel that her stents have gone down.  Will order an ABI to assess arterial patency.  I have also reached out to podiatry.  If they do plan on any type of irritation/debridement if bleeding is not adequate then we can plan on repeating an angio.  2) on aspirin, Plavix and statin for medical management  3) pending on blood cultures.  IV antibiotics started.  Discussed with Dr. Ellis Parents Libbie Bartley PA-C 12/22/2020 2:49 PM

## 2020-12-22 NOTE — Progress Notes (Signed)
   12/22/20 2041  Assess: MEWS Score  Temp 98.4 F (36.9 C)  BP 126/78  Pulse Rate (!) 117  Resp 20  SpO2 96 %  Assess: MEWS Score  MEWS Temp 0  MEWS Systolic 0  MEWS Pulse 2  MEWS RR 0  MEWS LOC 0  MEWS Score 2  MEWS Score Color Yellow  Assess: if the MEWS score is Yellow or Red  Were vital signs taken at a resting state? Yes  Focused Assessment No change from prior assessment  Early Detection of Sepsis Score *See Row Information* Medium  MEWS guidelines implemented *See Row Information* Yes  Take Vital Signs  Increase Vital Sign Frequency  Yellow: Q 2hr X 2 then Q 4hr X 2, if remains yellow, continue Q 4hrs  Notify: Charge Nurse/RN  Name of Charge Nurse/RN Notified Marcella RN   Date Charge Nurse/RN Notified 12/22/20  Time Charge Nurse/RN Notified 2054  Document  Patient Outcome Stabilized after interventions  Progress note created (see row info) Yes

## 2020-12-22 NOTE — ED Provider Notes (Signed)
[facility] Emergency Department Provider Note  ____________________________________________   Event Date/Time   First MD Initiated Contact with Patient 12/22/20 [phone]     (approximate)  I have reviewed the triage vital signs and the nursing notes.   HISTORY  Chief Complaint Necrotic Toe and Altered Mental Status    HPI [name] is a 75 y.o. female  With history as below including COPD, HTN, HLD, PVD here with toe pain and swelling. PT has known gangrene of left third toe. Reports that over the past week, she has had increasing redness, pain, and swelling of the toe.  The pain is 10 out of 10 in severity.  She has been taking more pain medications for this.  She states that she has also developed redness that is now extending up her foot to her ankle area.  This is all new.  Denies any new trauma.  She does not recall fevers but has had chills.  No alleviating factors.  Denies any new trauma to the area.  Of note, she recently had angiography and stent placement with Dr. Lucky Cowboy.        Past Medical History:  Diagnosis Date  . Anxiety   . CAD (coronary artery disease)   . Carpal tunnel syndrome   . Cataract   . Complication of anesthesia    has woken up at times  . COPD (chronic obstructive pulmonary disease) ([place])   . CRP elevated 09/14/2015  . Depression   . Difficult intubation   . Dyslipidemia   . Dyspnea    DOE  . Dysrhythmia    hx palpatations  . Elevated sedimentation rate 09/14/2015  . Esophageal spasm   . Gastrointestinal parasites   . GERD (gastroesophageal reflux disease)   . Hiatal hernia   . History of peptic ulcer disease   . Hyperthyroidism   . Hypothyroidism   . Low magnesium levels 09/14/2015  . Pelvic fracture ([place]) 2008   fall from riding a horse  . Reflux   . Rotator cuff injury   . Stenosis, spinal, lumbar     Patient Active Problem List   Diagnosis Date Noted  . Encounter for long-term opiate analgesic use  11/11/2020  . 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 Foot pain, right 11/11/2020  . Hard of hearing 11/11/2020  . Atherosclerosis of native arteries of the extremities with gangrene ([place]) 12/09/2019  . Vitamin D deficiency 11/12/2019  . Pharmacologic therapy 06/04/2019  . Disorder of skeletal system 06/04/2019  . Problems influencing health status 06/04/2019  . Sepsis ([place]) 10/22/2018  . HCAP (healthcare-associated pneumonia) 10/22/2018  . Compression fracture of L3 vertebra ([place]) 08/19/2018  . Acute UTI 08/18/2018  . Chronic hip pain (Bilateral) 12/31/2017  . Neurogenic pain 08/29/2017  . Chronic low back pain (1ry area of Pain) (Right) w/o sciatica 08/29/2017  . Chronic sacroiliac joint pain (Right) 06/05/2017  . Vitamin D insufficiency 03/12/2017  . Right hip pain 02/20/2017  . Intertrochanteric fracture of right hip, sequela 02/20/2017  . B12 deficiency 02/05/2017  . Pressure injury of skin 10/02/2016  . Closed displaced intertrochanteric fracture of right femur ([place]) 10/02/2016  . Hip fracture ([place]) 10/01/2016  . Cervical facet hypertrophy (Bilateral) 05/27/2016  . History of shoulder surgery 5 (Right) 05/27/2016  . Lumbar foraminal stenosis (L3-4) (Left) 05/27/2016  . 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 Lumbar facet hypertrophy (Bilateral) 05/27/2016  . Lumbar facet syndrome (Right) 05/27/2016  .  Lumbar grade 1 Anterolisthesis of L3 over L4 05/27/2016  . Chronic shoulder pain (Bilateral) (status post multiple surgeries) (R>L) 12/08/2015  . Substance use disorder Risk: High 09/14/2015  . Chronic pain syndrome 09/14/2015  . Cervical spondylosis 09/14/2015  . Chronic neck pain (2ry area of Pain) (Bilateral) (R>L) 09/14/2015  . 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 Cervical facet syndrome (Bilateral) (R>L) 09/14/2015  . Cervical myofascial pain syndrome 09/14/2015  . Lumbar  spondylosis 09/14/2015  . Chronic shoulder impingement syndrome (Right) 09/14/2015  . Hypomagnesemia 09/14/2015  . CRP elevated 09/14/2015  . Elevated sedimentation rate 09/14/2015  . Chronic obstructive pulmonary disease (COPD) ([place]) 09/14/2015  . Nicotine dependence 09/14/2015  . Chronic shoulder pain (Right) 09/14/2015  . Abnormal nerve conduction studies 09/14/2015  . Encounter for therapeutic drug level monitoring 09/09/2015  . Long term current use of opiate analgesic 09/09/2015  . Long term prescription opiate use 09/09/2015  . Uncomplicated opioid dependence ([place]) 09/09/2015  . Opiate use 09/09/2015  . Anxiety and depression 07/11/2012  . Tobacco use disorder 05/27/2011  . Fatigue 05/27/2011  . Hypothyroidism 11/25/2010  . HLD (hyperlipidemia) 08/24/2010  . Tachycardia 08/24/2010  . DYSPNEA 08/24/2010    Past Surgical History:  Procedure Laterality Date  . APPENDECTOMY    . BACK SURGERY     CERVICAL FUSION  . CARPAL TUNNEL RELEASE    . CATARACT EXTRACTION W/PHACO Right 08/07/2017   Procedure: CATARACT EXTRACTION PHACO AND INTRAOCULAR LENS PLACEMENT (IOC);  Surgeon: Birder Robson, MD;  Location: [facility] ORS;  Service: Ophthalmology;  Laterality: Right;  [place] 00:52.0 AP% 16.8 CDE 8.74 Fluid Pack Lot # [id number]  . CATARACT EXTRACTION W/PHACO Left 09/04/2017   Procedure: CATARACT EXTRACTION PHACO AND INTRAOCULAR LENS PLACEMENT (IOC);  Surgeon: Birder Robson, MD;  Location: [facility] ORS;  Service: Ophthalmology;  Laterality: Left;  [place] 00:34 AP% 17.0 CDE 5.80 Fluid pack lot # [id number]  . CESAREAN SECTION    . CHOLECYSTECTOMY    . FRACTURE SURGERY    . HIP SURGERY    . INTRAMEDULLARY (IM) NAIL INTERTROCHANTERIC Right 10/01/2016   Procedure: INTRAMEDULLARY (IM) NAIL INTERTROCHANTRIC;  Surgeon: Corky Mull, MD;  Location: [facility] ORS;  Service: Orthopedics;  Laterality: Right;  . KYPHOPLASTY N/A 08/20/2018   Procedure: [id number];  Surgeon: Hessie Knows, MD;  Location:  [facility] OR[facility];  Service: Orthopedics;  Laterality: N/A;  . LOWER EXTREMITY ANGIOGRAPHY Right 12/18/2019   Procedure: LOWER EXTREMITY ANGIOGRAPHY;  Surgeon: Algernon Huxley, MD;  Location: [facility];  Service: Cardiovascular;  Laterality: Right;  . LOWER EXTREMITY ANGIOGRAPHY Right 12/02/2020   Procedure: LOWER EXTREMITY ANGIOGRAPHY;  Surgeon: Algernon Huxley, MD;  Location: [facility];  Service: Cardiovascular;  Laterality: Right;  . NECK SURGERY    . NOSE SURGERY    . ROTATOR CUFF REPAIR     x2  . SHOULDER ARTHROSCOPY  12/07/2011   Procedure: ARTHROSCOPY SHOULDER;  Surgeon: Ninetta Lights, MD;  Location: [facility];  Service: Orthopedics;  Laterality: Right;  Debridement Partial Cuff Tear, Release Coracoacromial Ligament  . SHOULDER SURGERY  12/07/2011   right    Prior to Admission medications   Medication Sig Start Date End Date Taking? Authorizing Provider  HYDROcodone-acetaminophen (NORCO/VICODIN) 5-325 MG tablet Take 1 tablet by mouth every 6 (six) hours as needed for moderate pain. 12/03/20   Kris Hartmann, NP  ALPRAZolam Duanne Moron) 1 MG tablet Take 1 tablet (1 mg total) by mouth 4 (  four) times daily as needed. Patient taking differently: Take 1 mg by mouth 2 (two) times daily as needed. 10/04/16   Epifanio Lesches, MD  amLODipine (NORVASC) 5 MG tablet Take 1 tablet (5 mg total) by mouth daily. 08/22/18   Gladstone Lighter, MD  aspirin EC 81 MG tablet Take 1 tablet (81 mg total) by mouth daily. 12/02/20   Algernon Huxley, MD  atorvastatin (LIPITOR) 10 MG tablet Take 1 tablet (10 mg total) by mouth daily. 12/18/19 12/17/20  Algernon Huxley, MD  buPROPion (WELLBUTRIN SR) 150 MG 12 hr tablet Take 150 mg by mouth daily.     [provider]  clopidogrel (PLAVIX) 75 MG tablet Take 1 tablet (75 mg total) by mouth daily. 12/18/19   Algernon Huxley, MD  escitalopram (LEXAPRO) 10 MG tablet Take 10 mg by mouth daily.  10/15/13   [provider]   Fluticasone-Salmeterol (ADVAIR DISKUS) 250-50 MCG/DOSE AEPB Inhale 1 puff into the lungs 2 (two) times daily. 10/24/18 03/16/20  Dustin Flock, MD  Ipratropium-Albuterol (COMBIVENT) 20-100 MCG/ACT AERS respimat Inhale 1 puff into the lungs every 6 (six) hours as needed for wheezing or shortness of breath.    [provider]  ipratropium-albuterol (DUONEB) 0.5-2.5 (3) MG/3ML SOLN Take 3 mLs by nebulization every 4 (four) hours as needed. 10/24/18   Dustin Flock, MD  levothyroxine (SYNTHROID, LEVOTHROID) 88 MCG tablet Take 88 mcg by mouth daily before breakfast.     [provider]  montelukast (SINGULAIR) 10 MG tablet Take 10 mg by mouth daily.    [provider]  oxyCODONE (OXY IR/ROXICODONE) 5 MG immediate release tablet Take 1 tablet (5 mg total) by mouth 2 (two) times daily as needed for severe pain. Must last 30 days Patient not taking: Reported on 12/02/2020 11/11/20 12/11/20  Milinda Pointer, MD  oxyCODONE (OXY IR/ROXICODONE) 5 MG immediate release tablet Take 1 tablet (5 mg total) by mouth 2 (two) times daily as needed for severe pain. Must last 30 days 12/11/20 01/10/21  Milinda Pointer, MD  oxyCODONE (OXY IR/ROXICODONE) 5 MG immediate release tablet Take 1 tablet (5 mg total) by mouth 2 (two) times daily as needed for severe pain. Must last 30 days 01/10/21 02/09/21  Milinda Pointer, MD  tizanidine (ZANAFLEX) 2 MG capsule Take 1 capsule (2 mg total) by mouth 3 (three) times daily as needed for muscle spasms. 11/03/20 02/01/21  Milinda Pointer, MD  traZODone (DESYREL) 100 MG tablet Take 1 tablet (100 mg total) by mouth at bedtime. Patient taking differently: Take 200 mg by mouth at bedtime. 08/21/18   Gladstone Lighter, MD    Allergies Patient has no known allergies.  Family History  Problem Relation Age of Onset  . Aneurysm Other     Social History Social History   Tobacco Use  . Smoking status: Current Some Day Smoker    Packs/day: 0.00     Types: Cigarettes  . Smokeless tobacco: Never Used  . Tobacco comment: pt states 2 cigs per month   Substance Use Topics  . Alcohol use: No  . Drug use: No    Review of Systems  Review of Systems  Constitutional: Positive for chills and fatigue. Negative for fever.  HENT: Negative for congestion and sore throat.   Eyes: Negative for visual disturbance.  Respiratory: Negative for cough and shortness of breath.   Cardiovascular: Negative for chest pain.  Gastrointestinal: Negative for abdominal pain, diarrhea, nausea and vomiting.  Genitourinary: Negative for flank pain.  Musculoskeletal:  Negative for back pain and neck pain.  Skin: Positive for wound. Negative for rash.  Neurological: Positive for weakness.     ____________________________________________  PHYSICAL EXAM:      VITAL SIGNS: ED Triage Vitals  Enc Vitals Group     BP 12/22/20 0809 (!) 145/82     Pulse Rate 12/22/20 0809 (!) 122     Resp 12/22/20 0809 (!) 24     Temp 12/22/20 0809 98.4 F (36.9 C)     Temp Source 12/22/20 0809 Oral     SpO2 12/22/20 0809 92 %     Weight 12/22/20 0810 130 lb 1.1 oz (59 kg)     Height 12/22/20 0810 5\' 4"  (1.626 m)     Head Circumference --      Peak Flow --      Pain Score --      Pain Loc --      Pain Edu? --      Excl. in [place]? --      Physical Exam Vitals and nursing note reviewed.  Constitutional:      General: She is not in acute distress.    Appearance: She is well-developed and well-nourished.  HENT:     Head: Normocephalic and atraumatic.  Eyes:     Conjunctiva/sclera: Conjunctivae normal.  Cardiovascular:     Rate and Rhythm: Normal rate and regular rhythm.     Heart sounds: Normal heart sounds.  Pulmonary:     Effort: Pulmonary effort is normal. No respiratory distress.     Breath sounds: No wheezing.  Abdominal:     General: There is no distension.  Musculoskeletal:        General: No edema.     Cervical back: Neck supple.  Skin:    General: Skin  is warm.     Capillary Refill: Capillary refill takes less than 2 seconds.     Findings: No rash.  Neurological:     Mental Status: She is alert and oriented to person, place, and time.     Motor: No abnormal muscle tone.      LOWER EXTREMITY EXAM: RIGHT  INSPECTION & PALPATION: Gangrene of third toe to level of MTP joint, with moderate erythema and TTP extending to the midfoot.  SENSORY: sensation is intact to light touch in:  Superficial peroneal nerve distribution (over dorsum of foot) Deep peroneal nerve distribution (over first dorsal web space) Sural nerve distribution (over lateral aspect 5th metatarsal) Saphenous nerve distribution (over medial instep)  MOTOR:  + Motor EHL (great toe dorsiflexion) + FHL (great toe plantar flexion)  + TA (ankle dorsiflexion)  + GSC (ankle plantar flexion)  VASCULAR: Toes warm Pulses dopplerable only  COMPARTMENTS: Soft, warm, well-perfused No pain with passive extension No parethesias   ____________________________________________   LABS (all labs ordered are listed, but only abnormal results are displayed)  Labs Reviewed  CBC WITH DIFFERENTIAL/PLATELET - Abnormal; Notable for the following components:      Result Value   Hemoglobin 11.0 (*)    HCT 34.7 (*)    Platelets 557 (*)    Neutro Abs 7.8 (*)    All other components within normal limits  COMPREHENSIVE METABOLIC PANEL - Abnormal; Notable for the following components:   Potassium 3.0 (*)    Glucose, Bld 110 (*)    Calcium 8.6 (*)    Albumin 3.2 (*)    All other components within normal limits  SEDIMENTATION RATE - Abnormal; Notable for the following components:  Sed Rate 47 (*)    All other components within normal limits  CULTURE, BLOOD (ROUTINE X 2)  CULTURE, BLOOD (ROUTINE X 2)  SARS CORONAVIRUS 2 BY RT PCR (HOSPITAL ORDER, [place] [facility])  LACTIC ACID, PLASMA  LACTIC ACID, PLASMA  C-REACTIVE PROTEIN     ____________________________________________  EKG:  ________________________________________  RADIOLOGY All imaging, including plain films, CT scans, and ultrasounds, independently reviewed by me, and interpretations confirmed via formal radiology reads.  ED MD interpretation:   XR: Concerning for osteo of third toe on my review  Official radiology report(s): DG Foot Complete Right  Result Date: 12/22/2020 CLINICAL DATA:  Necrotic right third toe.  Gangrene. EXAM: RIGHT FOOT COMPLETE - 3+ VIEW COMPARISON:  12/02/2019 FINDINGS: Erosive changes at the third PIP joint has developed since the prior study. D IP joint intact. No fracture. No other arthropathy. 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 Electronically Signed   By: Franchot Gallo M.D.   On: 12/22/2020 09:11    ____________________________________________  PROCEDURES   Procedure(s) performed (including Critical Care):  Procedures  ____________________________________________  INITIAL IMPRESSION / MDM / [place] / ED COURSE  As part of my medical decision making, I reviewed the following data within the [place] notes reviewed and incorporated, Old chart reviewed, Notes from prior ED visits, and [place] Controlled Substance [place] was evaluated in Emergency Department on 12/22/2020 for the symptoms described in the history of present illness. She was evaluated in the context of the global COVID-19 pandemic, which necessitated consideration that the patient might be at risk for infection with the SARS-CoV-2 virus that causes COVID-19. Institutional protocols and algorithms that pertain to the evaluation of patients at risk for COVID-19 are in a state of rapid change based on information released by regulatory bodies including the CDC and federal and state organizations. These policies and algorithms were followed  during the patient's care in the ED.  Some ED evaluations and interventions may be delayed as a result of limited staffing during the pandemic.*     Medical Decision Making:  76 yo F here with acute on chronic R third toe pain. On exam, pt has gangrene of digit with moderate erythema c/f superimposed cellulitis. No signs of abscess. No purulence. Pt noted to have moderate left shift as well as tachycardia on arrival. CMP unremarkable. LA normal. Will start empiric ABX. Dr. Lucky Cowboy of Vascular consulted, as well as Dr. Vickki Muff of Podiatry who will see pt. Imaging c/f osteo and septic arthritis. IV Analgesia given. Admit to medicine.  ____________________________________________  FINAL CLINICAL IMPRESSION(S) / ED DIAGNOSES  Final diagnoses:  Dry gangrene ([place])  Cellulitis of right foot     MEDICATIONS GIVEN DURING THIS VISIT:  Medications  ceFEPIme (MAXIPIME) 2 g in sodium chloride 0.9 % 100 mL IVPB (has no administration in time range)  vancomycin (VANCOCIN) IVPB 1000 mg/200 mL premix (has no administration in time range)  morphine 4 MG/ML injection 5 mg (5 mg Intravenous Given 12/22/20 0909)  ondansetron (ZOFRAN) injection 4 mg (4 mg Intravenous Given 12/22/20 0909)  sodium chloride 0.9 % bolus 500 mL (0 mLs Intravenous Stopped 12/22/20 1052)     ED Discharge Orders    None       Note:  This document was prepared using Dragon voice recognition software and may include unintentional dictation errors.   Duffy Bruce,  MD 12/22/20 1113

## 2020-12-22 NOTE — H&P (View-Only) (Signed)
[facility] Daily Progress Note  Subjective: 12/02/20: 1. Ultrasound guidance for vascular access left femoral artery 2. Catheter placement into right common femoral artery from left femoral approach 3. Aortogram and selective right lower extremity angiogram 4. mechanical thrombectomy of the right distal SFA and popliteal artery with the [place] device 5. percutaneous transluminal angioplasty of the right anterior tibial artery with 3 mm diameter angioplasty balloon             6.  Viabahn stent placement to the right most distal SFA and popliteal artery with 6 mm diameter by 15 cm length stent for residual stenosis and thrombus after above procedures             7. Viabahn stent placement to the right anterior tibial artery with 5 mm diameter by 7.5 cm length stent 8. StarClose closure device left femoral artery  Patient with recent intervention to the right lower extremity.  Patient presents today with progressively worsening foot pain.  Patient states gangrenous changes which were located to the tip of her toe have now progressed proximally.  Denies any fever, nausea or vomiting.  Objective: Vitals:   12/22/20 0915 12/22/20 1000 12/22/20 1051 12/22/20 1100  BP: 117/82 (!) 167/77 140/86 (!) 154/86  Pulse: (!) 111 (!) 109 (!) 111 (!) 115  Resp: (!) 23 (!) 24 (!) 24 (!) 30  Temp:      TempSrc:      SpO2: 96% 95% 95% 95%  Weight:      Height:        Intake/Output Summary (Last 24 hours) at 12/22/2020 1449 Last data filed at 12/22/2020 1052 Gross per 24 hour  Intake 500 ml  Output --  Net 500 ml   Physical Exam: A&Ox3, NAD CV: RRR Pulmonary: CTA Bilaterally Abdomen: Soft, Nontender, Nondistended Vascular:  Right lower extremity: Thigh soft.  Calf soft.  Extremities warm distally to toes.  Hard to palpate pedal pulses however the foot is warm.  Gangrenous changes noted to the fourth  toe. Dry. No drainage.   Laboratory: CBC    Component Value Date/Time   WBC 10.0 12/22/2020 0913   HGB 11.0 (L) 12/22/2020 0913   HCT 34.7 (L) 12/22/2020 0913   PLT 557 (H) 12/22/2020 0913   BMET    Component Value Date/Time   NA 141 12/22/2020 0913   NA 143 10/06/2019 1552   NA 138 02/10/2014 1335   K 3.0 (L) 12/22/2020 0913   K 4.5 02/10/2014 1335   CL 105 12/22/2020 0913   CL 106 02/10/2014 1335   CO2 24 12/22/2020 0913   CO2 28 02/10/2014 1335   GLUCOSE 110 (H) 12/22/2020 0913   GLUCOSE 99 02/10/2014 1335   BUN 15 12/22/2020 0913   BUN 7 (L) 10/06/2019 1552   BUN 6 (L) 02/10/2014 1335   CREATININE 0.80 12/22/2020 0913   CREATININE 0.89 02/10/2014 1335   CALCIUM 8.6 (L) 12/22/2020 0913   CALCIUM 8.7 02/10/2014 1335   GFRNONAA >60 12/22/2020 0913   GFRNONAA >60 02/10/2014 1335   GFRAA >60 12/18/2019 1012   GFRAA >60 02/10/2014 1335   Assessment/Planning: The patient is a 75-year-old female with known history of atherosclerotic disease with gangrenous changes to the right foot status post endovascular intervention on December 02, 2020 who presents today with progressively worsening right foot pain  1) patient underwent right lower extremity angiogram with intervention on December 02, 2020.  On exam the patient's extremity is warm distally to   the toes.  I do not feel that her stents have gone down.  Will order an ABI to assess arterial patency.  I have also reached out to podiatry.  If they do plan on any type of irritation/debridement if bleeding is not adequate then we can plan on repeating an angio.  2) on aspirin, Plavix and statin for medical management  3) pending on blood cultures.  IV antibiotics started.  Discussed with Dr. Ellis Parents Daviyon Widmayer PA-C 12/22/2020 2:49 PM

## 2020-12-22 NOTE — ED Notes (Signed)
[name]- Pt's caregiver [phone]

## 2020-12-23 ENCOUNTER — Other Ambulatory Visit (INDEPENDENT_AMBULATORY_CARE_PROVIDER_SITE_OTHER): Payer: Self-pay | Admitting: Vascular Surgery

## 2020-12-23 ENCOUNTER — Encounter: Payer: Self-pay | Admitting: Anesthesiology

## 2020-12-23 ENCOUNTER — Encounter
Admission: EM | Disposition: A | Discharge status: Home Health | Payer: Self-pay | Source: Home / Self Care | Attending: Internal Medicine

## 2020-12-23 ENCOUNTER — Encounter: Payer: Self-pay | Admitting: Vascular Surgery

## 2020-12-23 DIAGNOSIS — M869 Osteomyelitis, unspecified: Principal | ICD-10-CM

## 2020-12-23 DIAGNOSIS — I96 Gangrene, not elsewhere classified: Secondary | ICD-10-CM | POA: Diagnosis not present

## 2020-12-23 DIAGNOSIS — Z7902 Long term (current) use of antithrombotics/antiplatelets: Secondary | ICD-10-CM | POA: Diagnosis not present

## 2020-12-23 DIAGNOSIS — Z7982 Long term (current) use of aspirin: Secondary | ICD-10-CM | POA: Diagnosis not present

## 2020-12-23 DIAGNOSIS — F419 Anxiety disorder, unspecified: Secondary | ICD-10-CM | POA: Diagnosis not present

## 2020-12-23 DIAGNOSIS — F32A Depression, unspecified: Secondary | ICD-10-CM

## 2020-12-23 DIAGNOSIS — I739 Peripheral vascular disease, unspecified: Secondary | ICD-10-CM

## 2020-12-23 DIAGNOSIS — I70261 Atherosclerosis of native arteries of extremities with gangrene, right leg: Secondary | ICD-10-CM

## 2020-12-23 HISTORY — PX: LOWER EXTREMITY ANGIOGRAPHY: CATH118251

## 2020-12-23 LAB — CBC
HCT: 33.1 % — ABNORMAL LOW (ref 36.0–46.0)
HCT: 28.8 % — ABNORMAL LOW (ref 36.0–46.0)
HCT: 31.6 % — ABNORMAL LOW (ref 36.0–46.0)
HCT: 30.3 % — ABNORMAL LOW (ref 36.0–46.0)
Hemoglobin: 10.5 g/dL — ABNORMAL LOW (ref 12.0–15.0)
Hemoglobin: 9 g/dL — ABNORMAL LOW (ref 12.0–15.0)
Hemoglobin: 9.6 g/dL — ABNORMAL LOW (ref 12.0–15.0)
Hemoglobin: 9.4 g/dL — ABNORMAL LOW (ref 12.0–15.0)
MCH: 29 pg (ref 26.0–34.0)
MCH: 28.2 pg (ref 26.0–34.0)
MCH: 27.8 pg (ref 26.0–34.0)
MCH: 28.2 pg (ref 26.0–34.0)
MCHC: 31.7 g/dL (ref 30.0–36.0)
MCHC: 31.3 g/dL (ref 30.0–36.0)
MCHC: 30.4 g/dL (ref 30.0–36.0)
MCHC: 31 g/dL (ref 30.0–36.0)
MCV: 91.4 fL (ref 80.0–100.0)
MCV: 90.3 fL (ref 80.0–100.0)
MCV: 91.6 fL (ref 80.0–100.0)
MCV: 91 fL (ref 80.0–100.0)
Platelets: 385 10*3/uL (ref 150–400)
Platelets: 370 10*3/uL (ref 150–400)
Platelets: 358 10*3/uL (ref 150–400)
Platelets: 362 10*3/uL (ref 150–400)
RBC: 3.62 MIL/uL — ABNORMAL LOW (ref 3.87–5.11)
RBC: 3.19 MIL/uL — ABNORMAL LOW (ref 3.87–5.11)
RBC: 3.45 MIL/uL — ABNORMAL LOW (ref 3.87–5.11)
RBC: 3.33 MIL/uL — ABNORMAL LOW (ref 3.87–5.11)
RDW: 14.2 % (ref 11.5–15.5)
RDW: 14.1 % (ref 11.5–15.5)
RDW: 14.1 % (ref 11.5–15.5)
RDW: 14.1 % (ref 11.5–15.5)
WBC: 8.1 10*3/uL (ref 4.0–10.5)
WBC: 7.8 10*3/uL (ref 4.0–10.5)
WBC: 7.9 10*3/uL (ref 4.0–10.5)
WBC: 10.8 10*3/uL — ABNORMAL HIGH (ref 4.0–10.5)
nRBC: 0 % (ref 0.0–0.2)
nRBC: 0 % (ref 0.0–0.2)
nRBC: 0 % (ref 0.0–0.2)
nRBC: 0 % (ref 0.0–0.2)

## 2020-12-23 LAB — HEPARIN LEVEL (UNFRACTIONATED)
Heparin Unfractionated: 0.87 IU/mL — ABNORMAL HIGH (ref 0.30–0.70)
Heparin Unfractionated: 0.2 IU/mL — ABNORMAL LOW (ref 0.30–0.70)
Heparin Unfractionated: 0.12 IU/mL — ABNORMAL LOW (ref 0.30–0.70)

## 2020-12-23 LAB — BASIC METABOLIC PANEL
Anion gap: 7 (ref 5–15)
BUN: 9 mg/dL (ref 8–23)
CO2: 22 mmol/L (ref 22–32)
Calcium: 7.9 mg/dL — ABNORMAL LOW (ref 8.9–10.3)
Chloride: 112 mmol/L — ABNORMAL HIGH (ref 98–111)
Creatinine, Ser: 0.5 mg/dL (ref 0.44–1.00)
GFR, Estimated: >60 mL/min (ref >60)
Glucose, Bld: 63 mg/dL — ABNORMAL LOW (ref 70–99)
Potassium: 4.2 mmol/L (ref 3.5–5.1)
Sodium: 141 mmol/L (ref 135–145)

## 2020-12-23 LAB — FIBRINOGEN
Fibrinogen: 326 mg/dL (ref 210–475)
Fibrinogen: 345 mg/dL (ref 210–475)
Fibrinogen: 371 mg/dL (ref 210–475)

## 2020-12-23 LAB — GLUCOSE, CAPILLARY
Glucose-Capillary: 174 mg/dL — ABNORMAL HIGH (ref 70–99)
Glucose-Capillary: 39 mg/dL — CL (ref 70–99)
Glucose-Capillary: 52 mg/dL — ABNORMAL LOW (ref 70–99)
Glucose-Capillary: 99 mg/dL (ref 70–99)

## 2020-12-23 SURGERY — LOWER EXTREMITY ANGIOGRAPHY
Anesthesia: Moderate Sedation | Laterality: Right

## 2020-12-23 MED ORDER — MIDAZOLAM HCL 5 MG/5ML IJ SOLN
INTRAMUSCULAR | Status: AC
  Filled 2020-12-23: qty 5

## 2020-12-23 MED ORDER — HEPARIN SODIUM (PORCINE) 1000 UNIT/ML IJ SOLN
INTRAMUSCULAR | Status: AC
  Filled 2020-12-23: qty 1

## 2020-12-23 MED ORDER — HEPARIN (PORCINE) 25000 UT/250ML-% IV SOLN
INTRAVENOUS | Status: AC
  Administered 2020-12-23: 600 [IU]/h via INTRAVENOUS
  Filled 2020-12-23: qty 250

## 2020-12-23 MED ORDER — ONDANSETRON HCL 4 MG/2ML IJ SOLN: 4.0000 mg | Freq: Four times a day (QID) | INTRAMUSCULAR | Status: DC | PRN

## 2020-12-23 MED ORDER — ALTEPLASE 2 MG IJ SOLR
INTRAMUSCULAR | Status: AC
  Filled 2020-12-23: qty 8

## 2020-12-23 MED ORDER — SODIUM CHLORIDE 0.9 % IV SOLN
0.5000 mg/h | INTRAVENOUS | Status: DC
  Administered 2020-12-24: 0.5 mg/h
  Filled 2020-12-23 (×4): qty 10

## 2020-12-23 MED ORDER — DIPHENHYDRAMINE HCL 50 MG/ML IJ SOLN: 50.0000 mg | Freq: Once | INTRAMUSCULAR | Status: DC | PRN

## 2020-12-23 MED ORDER — MIDAZOLAM HCL 2 MG/ML PO SYRP: 8.0000 mg | ORAL_SOLUTION | Freq: Once | ORAL | Status: DC | PRN

## 2020-12-23 MED ORDER — FENTANYL CITRATE (PF) 100 MCG/2ML IJ SOLN
INTRAMUSCULAR | Status: AC
  Filled 2020-12-23: qty 2

## 2020-12-23 MED ORDER — HEPARIN SODIUM (PORCINE) 1000 UNIT/ML IJ SOLN
INTRAMUSCULAR | Status: DC | PRN
  Administered 2020-12-23: 5000 [IU] via INTRAVENOUS

## 2020-12-23 MED ORDER — CEFAZOLIN SODIUM-DEXTROSE 1-4 GM/50ML-% IV SOLN
1.0000 g | Freq: Three times a day (TID) | INTRAVENOUS | Status: DC
  Administered 2020-12-24 – 2020-12-30 (×18): 1 g via INTRAVENOUS
  Filled 2020-12-23 (×23): qty 50

## 2020-12-23 MED ORDER — HYDROMORPHONE HCL 1 MG/ML IJ SOLN
1.0000 mg | Freq: Once | INTRAMUSCULAR | Status: AC | PRN
Start: 2020-12-23 — End: 2020-12-23

## 2020-12-23 MED ORDER — METHYLPREDNISOLONE SODIUM SUCC 125 MG IJ SOLR: 125.0000 mg | Freq: Once | INTRAMUSCULAR | Status: DC | PRN

## 2020-12-23 MED ORDER — SODIUM CHLORIDE 0.9 % IV SOLN
1.0000 mg/h | INTRAVENOUS | Status: AC
  Administered 2020-12-23: 1 mg/h
  Filled 2020-12-23: qty 10

## 2020-12-23 MED ORDER — MORPHINE SULFATE (PF) 2 MG/ML IV SOLN
2.0000 mg | INTRAVENOUS | Status: DC | PRN
  Administered 2020-12-23 – 2020-12-31 (×28): 2 mg via INTRAVENOUS
  Filled 2020-12-23 (×29): qty 1

## 2020-12-23 MED ORDER — HYDROMORPHONE HCL 1 MG/ML IJ SOLN
INTRAMUSCULAR | Status: AC
  Administered 2020-12-23: 0.5 mg via INTRAVENOUS
  Filled 2020-12-23: qty 1

## 2020-12-23 MED ORDER — HYDROMORPHONE HCL 1 MG/ML IJ SOLN
0.5000 mg | Freq: Once | INTRAMUSCULAR | Status: AC
  Administered 2020-12-23: 0.5 mg via INTRAVENOUS
  Filled 2020-12-23: qty 1

## 2020-12-23 MED ORDER — MIDAZOLAM HCL 2 MG/2ML IJ SOLN
INTRAMUSCULAR | Status: DC | PRN
  Administered 2020-12-23: 1 mg via INTRAVENOUS
  Administered 2020-12-23 (×2): 2 mg via INTRAVENOUS

## 2020-12-23 MED ORDER — CEFAZOLIN SODIUM-DEXTROSE 2-4 GM/100ML-% IV SOLN
2.0000 g | Freq: Once | INTRAVENOUS | Status: AC
  Administered 2020-12-23 – 2020-12-24 (×2): 2 g via INTRAVENOUS

## 2020-12-23 MED ORDER — DEXMEDETOMIDINE HCL IN NACL 400 MCG/100ML IV SOLN
0.4000 ug/kg/h | INTRAVENOUS | Status: DC
  Administered 2020-12-23: 0.7 ug/kg/h via INTRAVENOUS
  Administered 2020-12-23 – 2020-12-24 (×2): 0.6 ug/kg/h via INTRAVENOUS
  Administered 2020-12-25: 0.5 ug/kg/h via INTRAVENOUS
  Administered 2020-12-25: 0.4 ug/kg/h via INTRAVENOUS
  Filled 2020-12-23 (×5): qty 100

## 2020-12-23 MED ORDER — HEPARIN (PORCINE) 25000 UT/250ML-% IV SOLN: 750.0000 [IU]/h | INTRAVENOUS | Status: DC

## 2020-12-23 MED ORDER — DEXTROSE 50 % IV SOLN
INTRAVENOUS | Status: AC
  Administered 2020-12-23: 50 mL
  Filled 2020-12-23: qty 50

## 2020-12-23 MED ORDER — FENTANYL CITRATE (PF) 100 MCG/2ML IJ SOLN
INTRAMUSCULAR | Status: DC | PRN
  Administered 2020-12-23: 50 ug via INTRAVENOUS
  Administered 2020-12-23 (×2): 25 ug via INTRAVENOUS

## 2020-12-23 MED ORDER — DIPHENHYDRAMINE HCL 50 MG/ML IJ SOLN
INTRAMUSCULAR | Status: AC
  Filled 2020-12-23: qty 1

## 2020-12-23 MED ORDER — SODIUM CHLORIDE 0.9 % IV SOLN: 250.0000 mL | INTRAVENOUS | Status: DC | PRN

## 2020-12-23 MED ORDER — SODIUM CHLORIDE 0.9% FLUSH
3.0000 mL | Freq: Two times a day (BID) | INTRAVENOUS | Status: DC
  Administered 2020-12-23 – 2020-12-25 (×5): 3 mL via INTRAVENOUS

## 2020-12-23 MED ORDER — IODIXANOL 320 MG/ML IV SOLN
INTRAVENOUS | Status: DC | PRN
  Administered 2020-12-23: 55 mL

## 2020-12-23 MED ORDER — SODIUM CHLORIDE 0.9 % IV SOLN: INTRAVENOUS | Status: DC

## 2020-12-23 MED ORDER — ALTEPLASE 2 MG IJ SOLR
INTRAMUSCULAR | Status: DC | PRN
  Administered 2020-12-23: 4 mg
  Administered 2020-12-23: 2 mg

## 2020-12-23 MED ORDER — DIPHENHYDRAMINE HCL 50 MG/ML IJ SOLN
INTRAMUSCULAR | Status: DC | PRN
  Administered 2020-12-23: 50 mg via INTRAVENOUS

## 2020-12-23 MED ORDER — FAMOTIDINE 20 MG PO TABS: 40.0000 mg | ORAL_TABLET | Freq: Once | ORAL | Status: DC | PRN

## 2020-12-23 MED ORDER — DEXTROSE 50 % IV SOLN: 1.0000 | Freq: Once | INTRAVENOUS | Status: AC

## 2020-12-23 MED ORDER — SODIUM CHLORIDE 0.9% FLUSH: 3.0000 mL | INTRAVENOUS | Status: DC | PRN

## 2020-12-23 MED ORDER — CHLORHEXIDINE GLUCONATE CLOTH 2 % EX PADS
6.0000 | MEDICATED_PAD | Freq: Every day | CUTANEOUS | Status: DC
  Administered 2020-12-23 – 2020-12-31 (×9): 6 via TOPICAL

## 2020-12-23 SURGICAL SUPPLY — 17 items
BALLN ULTRVRSE 2.5X300X150 (BALLOONS) ×2
BALLOON ULTRVRSE 2.5X300X150 (BALLOONS) IMPLANT
CANISTER PENUMBRA ENGINE (MISCELLANEOUS) ×1 IMPLANT
CATH ANGIO 5F PIGTAIL 65CM (CATHETERS) ×1 IMPLANT
CATH INDIGO CAT6 KIT (CATHETERS) ×1 IMPLANT
CATH INFUS 135CMX50CM (CATHETERS) ×1 IMPLANT
CATH VERT 5FR 125CM (CATHETERS) ×1 IMPLANT
GLIDEWIRE ADV .014X300CM (WIRE) ×1 IMPLANT
GLIDEWIRE ADV .035X260CM (WIRE) ×1 IMPLANT
KIT CV MULTILUMEN 7FR 20 (SET/KITS/TRAYS/PACK) ×2
KIT CV MULTILUMEN 7FR 20 SUB (SET/KITS/TRAYS/PACK) IMPLANT
KIT ENCORE 26 ADVANTAGE (KITS) ×1 IMPLANT
PACK ANGIOGRAPHY (CUSTOM PROCEDURE TRAY) ×2 IMPLANT
SHEATH BRITE TIP 5FRX11 (SHEATH) ×1 IMPLANT
SHEATH DESTIN RDC 6FR 45 (SHEATH) ×1 IMPLANT
TUBING CONTRAST HIGH PRESS 20 (MISCELLANEOUS) ×4 IMPLANT
WIRE GUIDERIGHT .035X150 (WIRE) ×1 IMPLANT

## 2020-12-23 NOTE — Progress Notes (Signed)
Pt is transferring to ICU. Report given to Darlyn Chamber RN

## 2020-12-23 NOTE — Consult Note (Signed)
NAME: [name]  DOB: 12/08/1945  MRN: [id number]  Date/Time: 12/23/2020 7:29 PM  REQUESTING PROVIDER: Dr. Kurtis Bushman Subjective:  REASON FOR CONSULT: gangrene toe ?No History available from patient. Chart reviewed [name] is a 75 y.o. female with a history of CAD, PVD, hyperlipidemia presented to the ED on 12/22/2020 with change in mental status and worsening gangrene rt toe. Pt on 12/02/20 underwent angio, mechanical thrombectomy of the right distal SFA and popliteal artery with rota rex device. HAD angioplasty of the rt ATA. Stent placement to yr right distal SFA and popliteal artery for gangrene.  On arrival in the ED vitals were temp 98.4, BP 141/78, sats 99% Labs revealed wbc 10, Hb 11 and PLT 557, cr 0.80. blood culture sent and she was started on vanco, ceftriaxone and flagyl She was seen by podiatrist and vascular surgeon.  She underwent angio on 12/23/2020 and had mechanical thrombectomy of the right SFA, popliteal artery and anterior tibial artery.  Also received catheter directed thrombolytic therapy with 4 mg of TPA instilled in the right SFA, popliteal artery and anterior tibial artery with infusion catheter.  She is admitted to the ICU for overnight thrombolytic therapy. 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  Past Medical History:  Diagnosis Date  . Anxiety   . CAD (coronary artery disease)   . Carpal tunnel syndrome   . Cataract   . Complication of anesthesia    has woken up at times  . COPD (chronic obstructive pulmonary disease) ([place])   . CRP elevated 09/14/2015  . Depression   . Difficult intubation   . Dyslipidemia   . Dyspnea    DOE  . Dysrhythmia    hx palpatations  . Elevated sedimentation rate 09/14/2015  . Esophageal spasm   . Gastrointestinal parasites   . GERD (gastroesophageal reflux disease)   . Hiatal hernia   . History of peptic ulcer disease   . Hyperthyroidism   . Hypothyroidism   . Low magnesium levels  09/14/2015  . Pelvic fracture ([place]) 2008   fall from riding a horse  . Reflux   . Rotator cuff injury   . Stenosis, spinal, lumbar     Past Surgical History:  Procedure Laterality Date  . APPENDECTOMY    . BACK SURGERY     CERVICAL FUSION  . CARPAL TUNNEL RELEASE    . CATARACT EXTRACTION W/PHACO Right 08/07/2017   Procedure: CATARACT EXTRACTION PHACO AND INTRAOCULAR LENS PLACEMENT (IOC);  Surgeon: Birder Robson, MD;  Location: [facility];  Service: Ophthalmology;  Laterality: Right;  [place] 00:52.0 AP% 16.8 CDE 8.74 Fluid Pack Lot # [id number]  . CATARACT EXTRACTION W/PHACO Left 09/04/2017   Procedure: CATARACT EXTRACTION PHACO AND INTRAOCULAR LENS PLACEMENT (IOC);  Surgeon: Birder Robson, MD;  Location: [facility];  Service: Ophthalmology;  Laterality: Left;  [place] 00:34 AP% 17.0 CDE 5.80 Fluid pack lot # [id number]  . CESAREAN SECTION    . CHOLECYSTECTOMY    . FRACTURE SURGERY    . HIP SURGERY    . INTRAMEDULLARY (IM) NAIL INTERTROCHANTERIC Right 10/01/2016   Procedure: INTRAMEDULLARY (IM) NAIL INTERTROCHANTRIC;  Surgeon: Corky Mull, MD;  Location: [facility];  Service: Orthopedics;  Laterality: Right;  . KYPHOPLASTY N/A 08/20/2018   Procedure: [id number];  Surgeon: Hessie Knows, MD;  Location: [facility];  Service: Orthopedics;  Laterality: N/A;  . LOWER EXTREMITY ANGIOGRAPHY Right 12/18/2019   Procedure: LOWER EXTREMITY ANGIOGRAPHY;  Surgeon: Algernon Huxley,  MD;  Location: [facility];  Service: Cardiovascular;  Laterality: Right;  . LOWER EXTREMITY ANGIOGRAPHY Right 12/02/2020   Procedure: LOWER EXTREMITY ANGIOGRAPHY;  Surgeon: Algernon Huxley, MD;  Location: [facility];  Service: Cardiovascular;  Laterality: Right;  . LOWER EXTREMITY ANGIOGRAPHY Right 12/23/2020   Procedure: Lower Extremity Angiography;  Surgeon: Algernon Huxley, MD;  Location: [facility];  Service: Cardiovascular;  Laterality: Right;  . NECK SURGERY    . NOSE SURGERY    . ROTATOR CUFF REPAIR      x2  . SHOULDER ARTHROSCOPY  12/07/2011   Procedure: ARTHROSCOPY SHOULDER;  Surgeon: Ninetta Lights, MD;  Location: [facility];  Service: Orthopedics;  Laterality: Right;  Debridement Partial Cuff Tear, Release Coracoacromial Ligament  . SHOULDER SURGERY  12/07/2011   right    Social History   Socioeconomic History  . Marital status: Divorced    Spouse name: Not on file  . Number of children: Not on file  . Years of education: Not on file  . Highest education level: Not on file  Occupational History  . Occupation: retired Air cabin crew  Tobacco Use  . Smoking status: Current Some Day Smoker    Packs/day: 0.00    Types: Cigarettes  . Smokeless tobacco: Never Used  . Tobacco comment: pt states 2 cigs per month   Substance and Sexual Activity  . Alcohol use: No  . Drug use: No  . Sexual activity: Never  Other Topics Concern  . Not on file  Social History Narrative  . Not on file   Social Determinants of Health   Financial Resource Strain: Not on file  Food Insecurity: Not on file  Transportation Needs: Not on file  Physical Activity: Not on file  Stress: Not on file  Social Connections: Not on file  Intimate Partner Violence: Not on file    Family History  Problem Relation Age of Onset  . Aneurysm Other    No Known Allergies  Current Facility-Administered Medications  Medication Dose Route Frequency Provider Last Rate Last Admin  . 0.9 %  sodium chloride infusion   Intravenous Continuous Stegmayer, Kimberly A, PA-C 75 mL/hr at 12/23/20 1300 New Bag at 12/23/20 1300  . 0.9 %  sodium chloride infusion  250 mL Intravenous PRN Stegmayer, Janalyn Harder, PA-C      . ALPRAZolam Duanne Moron) tablet 1 mg  1 mg Oral BID PRN Agbata, Tochukwu, MD   1 mg at 12/22/20 1902  . alteplase (LIMB ISCHEMIA) 10 mg in normal saline (0.02 mg/mL) infusion  0.5 mg/hr Intracatheter Continuous Stegmayer, Kimberly A, PA-C 25 mL/hr at 12/23/20 1525 0.5 mg/hr at 12/23/20 1525  .  amLODipine (NORVASC) tablet 5 mg  5 mg Oral Daily Agbata, Tochukwu, MD   5 mg at 12/22/20 1733  . atorvastatin (LIPITOR) tablet 10 mg  10 mg Oral Daily Agbata, Tochukwu, MD   10 mg at 12/22/20 1733  . buPROPion (WELLBUTRIN SR) 12 hr tablet 150 mg  150 mg Oral Daily Agbata, Tochukwu, MD   150 mg at 12/22/20 1822  . cefTRIAXone (ROCEPHIN) 2 g in sodium chloride 0.9 % 100 mL IVPB  2 g Intravenous Q24H Agbata, Tochukwu, MD 200 mL/hr at 12/22/20 1803 2 g at 12/22/20 1803  . dexmedetomidine (PRECEDEX) 400 MCG/100ML (4 mcg/mL) infusion  0.4-1.2 mcg/kg/hr Intravenous Titrated Darel Hong D, NP 7.38 mL/hr at 12/23/20 1300 0.5 mcg/kg/hr at 12/23/20 1300  . diphenhydrAMINE (BENADRYL) 50 MG/ML injection           .  escitalopram (LEXAPRO) tablet 10 mg  10 mg Oral Daily Agbata, Tochukwu, MD   10 mg at 12/22/20 2101  . fentaNYL (SUBLIMAZE) 100 MCG/2ML injection           . heparin ADULT infusion 100 units/mL (25000 units/245mL)  600 Units/hr Intravenous Continuous Stegmayer, Kimberly A, PA-C 6 mL/hr at 12/23/20 1053 600 Units/hr at 12/23/20 1053  . heparin sodium (porcine) 1000 UNIT/ML injection           . influenza vaccine adjuvanted (FLUAD) injection 0.5 mL  0.5 mL Intramuscular Tomorrow-1000 Agbata, Tochukwu, MD      . ipratropium-albuterol (DUONEB) 0.5-2.5 (3) MG/3ML nebulizer solution 3 mL  3 mL Nebulization Q4H PRN Agbata, Tochukwu, MD   3 mL at 12/23/20 0437  . levothyroxine (SYNTHROID) tablet 88 mcg  88 mcg Oral QAC breakfast Agbata, Tochukwu, MD      . metroNIDAZOLE (FLAGYL) IVPB 500 mg  500 mg Intravenous Q8H Agbata, Tochukwu, MD 100 mL/hr at 12/23/20 0324 500 mg at 12/23/20 0324  . midazolam (VERSED) 5 MG/5ML injection           . mometasone-formoterol (DULERA) 200-5 MCG/ACT inhaler 2 puff  2 puff Inhalation BID Agbata, Tochukwu, MD   2 puff at 12/23/20 0018  . montelukast (SINGULAIR) tablet 10 mg  10 mg Oral Daily Agbata, Tochukwu, MD   10 mg at 12/22/20 1734  . morphine 2 MG/ML injection 2 mg  2  mg Intravenous Q2H PRN Athena Masse, MD   2 mg at 12/23/20 0843  . nicotine (NICODERM CQ - dosed in mg/24 hours) patch 14 mg  14 mg Transdermal Daily Agbata, Tochukwu, MD   14 mg at 12/22/20 1838  . ondansetron (ZOFRAN) tablet 4 mg  4 mg Oral Q6H PRN Agbata, Tochukwu, MD       Or  . ondansetron (ZOFRAN) injection 4 mg  4 mg Intravenous Q6H PRN Agbata, Tochukwu, MD   4 mg at 12/23/20 0843  . ondansetron (ZOFRAN) injection 4 mg  4 mg Intravenous Q6H PRN Stegmayer, Kimberly A, PA-C      . pneumococcal 23 valent vaccine (PNEUMOVAX-23) injection 0.5 mL  0.5 mL Intramuscular Tomorrow-1000 Agbata, Tochukwu, MD      . sodium chloride flush (NS) 0.9 % injection 3 mL  3 mL Intravenous Q12H Stegmayer, Kimberly A, PA-C      . sodium chloride flush (NS) 0.9 % injection 3 mL  3 mL Intravenous PRN Stegmayer, Kimberly A, PA-C      . tiZANidine (ZANAFLEX) tablet 2 mg  2 mg Oral TID PRN Agbata, Tochukwu, MD      . traZODone (DESYREL) tablet 200 mg  200 mg Oral QHS Agbata, Tochukwu, MD   200 mg at 12/22/20 2100  . vancomycin (VANCOCIN) IVPB 1000 mg/200 mL premix  1,000 mg Intravenous Q24H Benita Gutter, RPH         Abtx:  Anti-infectives (From admission, onward)   Start     Dose/Rate Route Frequency Ordered Stop   12/23/20 1000  vancomycin (VANCOCIN) IVPB 1000 mg/200 mL premix        1,000 mg 200 mL/hr over 60 Minutes Intravenous Every 24 hours 12/22/20 1204     12/23/20 0945  ceFAZolin (ANCEF) IVPB 2g/100 mL premix       Note to Pharmacy: To be given in specials   2 g 200 mL/hr over 30 Minutes Intravenous  Once 12/23/20 0854 12/23/20 1100   12/22/20 1800  cefTRIAXone (ROCEPHIN) 2 g in sodium chloride  0.9 % 100 mL IVPB        2 g 200 mL/hr over 30 Minutes Intravenous Every 24 hours 12/22/20 1127     12/22/20 1130  metroNIDAZOLE (FLAGYL) IVPB 500 mg        500 mg 100 mL/hr over 60 Minutes Intravenous Every 8 hours 12/22/20 1127     12/22/20 1115  ceFEPIme (MAXIPIME) 2 g in sodium chloride 0.9 % 100  mL IVPB        2 g 200 mL/hr over 30 Minutes Intravenous  Once 12/22/20 1100 12/22/20 1149   12/22/20 1115  vancomycin (VANCOCIN) IVPB 1000 mg/200 mL premix        1,000 mg 200 mL/hr over 60 Minutes Intravenous  Once 12/22/20 1100 12/22/20 1447      REVIEW OF SYSTEMS:  Not available.   Objective:  VITALS:  BP 129/66   Pulse 63   Temp 98 F (36.7 C) (Oral)   Resp 18   Ht 5\' 4"  (1.626 m)   Wt 51.3 kg   SpO2 100%   BMI 19.41 kg/m  PHYSICAL EXAM:  General: Sleeping, and calling her name she woke up.  Responded to few questions and went back to sleep Head: Normocephalic, without obvious abnormality, atraumatic. Eyes: Conjunctivae clear, anicteric sclerae. Pupils are equal ENT Nares normal. No drainage or sinus tenderness. Lips, mucosa, and tongue normal. No Thrush Neck: , symmetrical, no adenopathy, thyroid: non tender no carotid bruit and no JVD.  Lungs: Bilateral air entry Heart: Regular rate and rhythm, no murmur, rub or gallop. Abdomen: Soft, bladder felt about the suprapubic area. Extremities: Third toe on the right has dry gangrene       Lymph: Cervical, supraclavicular normal. Neurologic: Cannot assess Pertinent Labs Lab Results CBC    Component Value Date/Time   WBC 7.9 12/23/2020 1627   RBC 3.45 (L) 12/23/2020 1627   HGB 9.6 (L) 12/23/2020 1627   HCT 31.6 (L) 12/23/2020 1627   PLT 358 12/23/2020 1627   MCV 91.6 12/23/2020 1627   MCH 27.8 12/23/2020 1627   MCHC 30.4 12/23/2020 1627   RDW 14.1 12/23/2020 1627   LYMPHSABS 1.4 12/22/2020 0913   MONOABS 0.5 12/22/2020 0913   EOSABS 0.1 12/22/2020 0913   BASOSABS 0.1 12/22/2020 0913    CMP Latest Ref Rng & Units 12/23/2020 12/22/2020 12/18/2019  Glucose 70 - 99 mg/dL 63(L) 110(H) -  BUN 8 - 23 mg/dL 9 15 11   Creatinine 0.44 - 1.00 mg/dL 0.50 0.80 0.86  Sodium 135 - 145 mmol/L 141 141 -  Potassium 3.5 - 5.1 mmol/L 4.2 3.0(L) -  Chloride 98 - 111 mmol/L 112(H) 105 -  CO2 22 - 32 mmol/L 22 24 -  Calcium  8.9 - 10.3 mg/dL 7.9(L) 8.6(L) -  Total Protein 6.5 - 8.1 g/dL - 6.9 -  Total Bilirubin 0.3 - 1.2 mg/dL - 0.4 -  Alkaline Phos 38 - 126 U/L - 76 -  AST 15 - 41 U/L - 15 -  ALT 0 - 44 U/L - 10 -      Microbiology: Recent Results (from the past 240 hour(s))  Blood culture (routine x 2)     Status: None (Preliminary result)   Collection Time: 12/22/20  9:13 AM   Specimen: BLOOD  Result Value Ref Range Status   Specimen Description BLOOD LEFT ANTECUBITAL  Final   Special Requests   Final    BOTTLES DRAWN AEROBIC AND ANAEROBIC Blood Culture results may not be optimal due  to an excessive volume of blood received in culture bottles   Culture   Final    NO GROWTH < 24 HOURS Performed at [facility], [address] [place], [place] [address]    Report Status PENDING  Incomplete  Blood culture (routine x 2)     Status: None (Preliminary result)   Collection Time: 12/22/20  9:13 AM   Specimen: BLOOD  Result Value Ref Range Status   Specimen Description BLOOD LEFT WRIST  Final   Special Requests   Final    BOTTLES DRAWN AEROBIC AND ANAEROBIC Blood Culture results may not be optimal due to an excessive volume of blood received in culture bottles   Culture   Final    NO GROWTH < 24 HOURS Performed at [facility], [address], [place], [place] [address]    Report Status PENDING  Incomplete  SARS Coronavirus 2 by RT PCR (hospital order, performed in [facility] hospital lab) Nasopharyngeal Nasopharyngeal Swab     Status: None   Collection Time: 12/22/20 12:05 PM   Specimen: Nasopharyngeal Swab  Result Value Ref Range Status   SARS Coronavirus 2 NEGATIVE NEGATIVE Final    Comment: (NOTE) SARS-CoV-2 target nucleic acids are NOT DETECTED.  The SARS-CoV-2 RNA is generally detectable in upper and lower respiratory specimens during the acute phase of infection. The lowest concentration of SARS-CoV-2 viral copies this assay can detect is 250 copies / mL. A negative  result does not preclude SARS-CoV-2 infection and should not be used as the sole basis for treatment or other patient management decisions.  A negative result may occur with improper specimen collection / handling, submission of specimen other than nasopharyngeal swab, presence of viral mutation(s) within the areas targeted by this assay, and inadequate number of viral copies (<250 copies / mL). A negative result must be combined with clinical observations, patient history, and epidemiological information.  Fact Sheet for Patients:   StrictlyIdeas.no  Fact Sheet for Healthcare Providers: BankingDealers.co.za  This test is not yet approved or  cleared by the [place] FDA and has been authorized for detection and/or diagnosis of SARS-CoV-2 by FDA under an Emergency Use Authorization (EUA).  This EUA will remain in effect (meaning this test can be used) for the duration of the COVID-19 declaration under Section 564(b)(1) of the Act, 21 U.S.C. section 360bbb-3(b)(1), unless the authorization is terminated or revoked sooner.  Performed at [facility], [address], [place], [place] [address]   Surgical PCR screen     Status: None   Collection Time: 12/22/20  7:47 PM   Specimen: Nasal Mucosa; Nasal Swab  Result Value Ref Range Status   MRSA, PCR NEGATIVE NEGATIVE Final   Staphylococcus aureus NEGATIVE NEGATIVE Final    Comment: (NOTE) The Xpert SA Assay (FDA approved for NASAL specimens in patients 73 years of age and older), is one component of a comprehensive surveillance program. It is not intended to diagnose infection nor to guide or monitor treatment. Performed at [facility], [address] [place], [place] [address]     IMAGING RESULTS: Erosive changes of the third PIP compatible with septic arthritis and osteomyelitis I have personally reviewed the x-ray  foot. ? Impression/Recommendation ?Dry gangrene of the third toe on the right foot.  She has peripheral vascular disease and has undergone thrombectomy, and getting TPA locally.  Also on aspirin, Plavix and statin. There is no obvious infection at the site of the gangrene. She is  currently on Vanco ceftriaxone and Flagyl.  We will discontinue vancomycin and ceftriaxone and give cefazolin instead.  Plan for amputation of the toe tomorrow.  Current smoker  COPD  Hypothyroidism on Synthroid Hypertension on amlodipine Depression and anxiety on Lexapro trazodone bupropion and Xanax ? ? ___________________________________________________ Discussed the management with the care team. Note:  This document was prepared using Dragon voice recognition software and may include unintentional dictation errors.

## 2020-12-23 NOTE — Interval H&P Note (Signed)
History and Physical Interval Note:  12/23/2020 9:04 AM  [name]  has presented today for surgery, with the diagnosis of Atherosclerotic Disease With Gangrene.  The various methods of treatment have been discussed with the patient and family. After consideration of risks, benefits and other options for treatment, the patient has consented to  Procedure(s): Lower Extremity Angiography (Right) as a surgical intervention.  The patient's history has been reviewed, patient examined, no change in status, stable for surgery.  I have reviewed the patient's chart and labs.  Questions were answered to the patient's satisfaction.     Leotis Pain

## 2020-12-23 NOTE — Progress Notes (Signed)
[place] for heparin Indication: s/p mechanical thrombectomy, plan for catheter directed thrombolytic therapy  No Known Allergies  Patient Measurements: Height: 5\' 4"  (162.6 cm) Weight: 51.3 kg (113 lb 1.5 oz) IBW/kg (Calculated) : 54.7 Heparin Dosing Weight: 51 kg  Vital Signs: Temp: 97.7 F (36.5 C) (02/03 1232) Temp Source: Oral (02/03 1232) BP: 108/58 (02/03 1400) Pulse Rate: 63 (02/03 1400)  Labs: Recent Labs    12/22/20 0913 12/23/20 0510 12/23/20 1038  HGB 11.0* 10.5* 9.0*  HCT 34.7* 33.1* 28.8*  PLT 557* 385 370  HEPARINUNFRC  --   --  0.87*  CREATININE 0.80 0.50  --     Estimated Creatinine Clearance: 50.7 mL/min (by C-G formula based on SCr of 0.5 mg/dL).   Medical History: Past Medical History:  Diagnosis Date  . Anxiety   . CAD (coronary artery disease)   . Carpal tunnel syndrome   . Cataract   . Complication of anesthesia    has woken up at times  . COPD (chronic obstructive pulmonary disease) ([place])   . CRP elevated 09/14/2015  . Depression   . Difficult intubation   . Dyslipidemia   . Dyspnea    DOE  . Dysrhythmia    hx palpatations  . Elevated sedimentation rate 09/14/2015  . Esophageal spasm   . Gastrointestinal parasites   . GERD (gastroesophageal reflux disease)   . Hiatal hernia   . History of peptic ulcer disease   . Hyperthyroidism   . Hypothyroidism   . Low magnesium levels 09/14/2015  . Pelvic fracture ([place]) 2008   fall from riding a horse  . Reflux   . Rotator cuff injury   . Stenosis, spinal, lumbar      Assessment: 75 year old female with known h/o atherosclerotic disease with gangrenous changes to her right foot. Patient is s/p mechanical thrombectomy with plan for catheter directed thrombolytic therapy. Pharmacy consult for heparin drip.  Goal of Therapy:  Heparin level 0.2-0.5 units/ml Monitor platelets by anticoagulation protocol: Yes   Plan:  Heparin drip started at 600  units/hr per order set. HL currently ordered q6h. Will follow along and adjust rate as indicated.  Tawnya Crook, PharmD 12/23/2020,3:17 PM

## 2020-12-23 NOTE — Consult Note (Signed)
NAME:  [name], MRN:  [id number], DOB:  12/19/1945, LOS: 1 ADMISSION DATE:  12/22/2020, CONSULTATION DATE:  12/23/2020 REFERRING MD:  Hezzie Bump, CHIEF COMPLAINT:  Altered Mental Status, Pain to 3rd toe of Right Foot   Brief History:  75 y.o. Female admitted with Severe PVD w/ Gangrene to the Right 3rd toe.  Vascular Surgery Alison Podiatry consulted. On 12/23/20 she underwent Angiogram to the RLE with mechanical thrombectomy Alison catheter directed thrombolytic therapy to the Right SFA, popliteal artery, Alison anterior tibial artery.  She is being transferred to ICU as she requires overnight thrombolytic therapy via infusion catheter.  History of Present Illness:  [name] is a 75 year old female with a past medical history most notable for severe peripheral arterial disease status post recent (12/02/2020) mechanical thrombectomy Alison stent placement to the  right distal SFA Alison popliteal artery Alison recent dry gangrene of the right third toe who presented to [facility] ED on 12/22/2020 due to complaints of altered mental status Alison worsening pain in her right third toe.  She reported increased redness, pain, Alison swelling of her right third toe over the past week.  She rated her pain 10 out of 10, with progression of redness extending up to her right ankle with associated chills.  She denied fever, chest pain, shortness of breath, nausea, vomiting, diarrhea, palpitations, dizziness, abdominal pain, dysuria, or trauma to the area.  ER course: Upon presentation to the ED she was noted to be tachycardic Alison tachypneic, otherwise hemodynamically stable.  Labs showed sodium 141, potassium 3.0, chloride 105, bicarb 24, glucose 110, BUN 15, creatinine 0.8, calcium 8.6, alkaline phosphatase 76, albumin 3.2, AST 15, ALT 10, lactic acid 1.2, WBC 10.0, hemoglobin 11.0, hematocrit 34.7, platelets 557, ESR 47.  Her COVID-19 PCR is negative.  X-ray of the right foot showed erosive changes of the third PIP compatible with septic  arthritis Alison osteomyelitis.  She was given empiric IV antibiotics.  Hospitalist was asked to admit the patient for further work-up Alison treatment of osteomyelitis of the right third toe.  Vascular surgery Alison podiatry were consulted.  ABI of the lower extremities was obtained Alison revealed severe right lower extremity peripheral artery disease with right ABI of 0.36.  The left ankle-brachial index was normal.  On 12/23/2020 she underwent Angiogram to the RLE with mechanical thrombectomy Alison catheter directed thrombolytic therapy to the Right SFA, popliteal artery, Alison anterior tibial artery.  She is being transferred to ICU as she requires overnight thrombolytic therapy via infusion catheter.  PCCM is consulted as there is concern she may require Precedex infusion due to restlessness Alison need for bedrest/immobilization of the affected extremity.    Past Medical History:  COPD CAD Peripheral Artery Disease Dyslipidemia GERD Hypothyroidism Lumbar spinal stenosis Anxiety Depression  Significant Hospital Events:  2/2: Presented to ED, to be admitted by Hospitalist; Vascular Surgery Alison Podiatry consulted 2/3: Underwent Angiogram RLE, transfer to ICU post procedure for overnight thrombolytic therapy; PCCM consulted for possible Precedex  Consults:  Hospitalist (primary service) Vascular surgery Podiatry PCCM  Procedures:  2/3: Angiogram right lower extremity 2/3: Left femoral CVC placed  Significant Diagnostic Tests:  2/2: X-ray Right foot>>Erosive changes of the third PIP compatible with septic arthritis Alison osteomyelitis. 2/2: ABI Lower Extremities>>1. Severe right lower extremity peripheral artery disease with right ABI = 0.36. 2. Normal left ankle brachial index.  Micro Data:  2/2: SARS-CoV-2 PCR>> negative 2/2: HIV screen>> nonreactive 2/2: Blood culture x2>> 2/2:MRSA PCR>> negative  Antimicrobials:  Cefepime x1 dose 2/2 Ceftriaxone 2/2>> Flagyl 2/2>> Vancomycin  3/2>>  Interim History / Subjective:  -Patient is status post angiogram to the right lower extremity with mechanical thrombectomy to the right SFA, popliteal artery Alison anterior tibial artery, catheter directed thrombolytic therapy -Patient is seen in the in the PACU following procedure, resting comfortably status post fentanyl Alison Versed administration -Afebrile, hemodynamically stable, on 4L nasal cannula  Objective   Blood pressure (!) 157/85, pulse (!) 106, temperature 98.3 F (36.8 C), temperature source Oral, resp. rate 17, height 5' 4"  (1.626 m), weight 59 kg, SpO2 99 %.        Intake/Output Summary (Last 24 hours) at 12/23/2020 1034 Last data filed at 12/22/2020 1052 Gross per 24 hour  Intake 500 ml  Output -  Net 500 ml   Filed Weights   12/22/20 0810 12/23/20 0907  Weight: 59 kg 59 kg    Examination: General: Acutely ill-appearing female, laying in the bed, on 4 L nasal cannula, no acute distress HENT: Atraumatic, normocephalic, neck supple, no JVD Lungs: Coarse breath sounds to bilateral lower lobes, otherwise clear, even, nonlabored Cardiovascular: Regular rate Alison rhythm, S1-S2, no murmurs, rubs, gallops Abdomen: Soft, nontender, nondistended, no guarding or rebound tenderness, bowel sounds positive x4 Extremities: Left foot without ulceration or gangrenous changes Alison diminished dorsalis pedis Alison posterior tibial pulses; right foot with gangrenous right third toe, absent dorsalis pedis Alison posterior tibial pulses Neuro: Sleeping soundly status post fentanyl Versed administration, pupils PERRLA Skin: Gangrene to the right third toe      Resolved Hospital Problem list   N/A  Assessment & Plan:    Gangrene of the Right Third Toe Severe Peripheral Vascular Disease -Monitor fever curve -Trend WBC's -Follow cultures as above -Continue Rocephin, Flagyl, Alison Vancomycin for now pending cultures Alison sensitivities -Vascular Surgery Alison Podiatry following, appreciate  input -S/p Angiogram of RLE on 12/23/20 with Mechanical thrombectomy Alison catheter directed thrombolytic therapy to the Right SFA, popliteal artery, Alison anterior tibial artery -To undergo Thrombolytic therapy overnight in ICU with repeat Angiogram tomorrow 12/24/20 -Pain control -Precedex if needed to ensure pt relaxed Alison able to tolerate bedrest -Tentative plan for Amputation as some point as per Podiatry   History of COPD, without Acute Exacerbation -Supplemental O2 as needed to maintain O2 sats 88 to 94% -Follow intermittent CXR & ABG as needed -PRN Bronchodilators -Continue Dulera Alison Singulair -Encourage smoking cessation   Best practice (evaluated daily)  Diet: NPO Pain/Anxiety/Delirium protocol (if indicated): Dilaudid, Precedex if needed VAP protocol (if indicated): N/A DVT prophylaxis: Heparin gtt GI prophylaxis: N/A Glucose control: N/A Mobility: Bedrest Disposition:ICU  Goals of Care:  Last date of multidisciplinary goals of care discussion:N/A Family Alison staff present: Updated pt at bedside 12/23/20 Summary of discussion: Overnight infusion of tPA with return for Angiogram tomorrow Follow up goals of care discussion due: 12/24/20 Code Status: Full Code  Labs   CBC: Recent Labs  Lab 12/22/20 0913 12/23/20 0510  WBC 10.0 8.1  NEUTROABS 7.8*  --   HGB 11.0* 10.5*  HCT 34.7* 33.1*  MCV 87.8 91.4  PLT 557* 656    Basic Metabolic Panel: Recent Labs  Lab 12/22/20 0913 12/23/20 0510  NA 141 141  K 3.0* 4.2  CL 105 112*  CO2 24 22  GLUCOSE 110* 63*  BUN 15 9  CREATININE 0.80 0.50  CALCIUM 8.6* 7.9*   GFR: Estimated Creatinine Clearance: 54.1 mL/min (by C-G formula based on SCr of 0.5 mg/dL). Recent Labs  Lab 12/22/20 0913 12/23/20 0510  WBC 10.0 8.1  LATICACIDVEN 1.2  --     Liver Function Tests: Recent Labs  Lab 12/22/20 0913  AST 15  ALT 10  ALKPHOS 76  BILITOT 0.4  PROT 6.9  ALBUMIN 3.2*   No results for input(s): LIPASE, AMYLASE in the  last 168 hours. No results for input(s): AMMONIA in the last 168 hours.  ABG    Component Value Date/Time   TCO2 26 12/07/2011 1042     Coagulation Profile: No results for input(s): INR, PROTIME in the last 168 hours.  Cardiac Enzymes: No results for input(s): CKTOTAL, CKMB, CKMBINDEX, TROPONINI in the last 168 hours.  HbA1C: No results found for: HGBA1C  CBG: No results for input(s): GLUCAP in the last 168 hours.  Review of Systems:   Unable to assess due to lethargic following fentanyl Alison versed administration   Past Medical History:  She,  has a past medical history of Anxiety, CAD (coronary artery disease), Carpal tunnel syndrome, Cataract, Complication of anesthesia, COPD (chronic obstructive pulmonary disease) ([place]), CRP elevated (09/14/2015), Depression, Difficult intubation, Dyslipidemia, Dyspnea, Dysrhythmia, Elevated sedimentation rate (09/14/2015), Esophageal spasm, Gastrointestinal parasites, GERD (gastroesophageal reflux disease), Hiatal hernia, History of peptic ulcer disease, Hyperthyroidism, Hypothyroidism, Low magnesium levels (09/14/2015), Pelvic fracture ([place]) (2008), Reflux, Rotator cuff injury, Alison Stenosis, spinal, lumbar.   Surgical History:   Past Surgical History:  Procedure Laterality Date  . APPENDECTOMY    . BACK SURGERY     CERVICAL FUSION  . CARPAL TUNNEL RELEASE    . CATARACT EXTRACTION W/PHACO Right 08/07/2017   Procedure: CATARACT EXTRACTION PHACO Alison INTRAOCULAR LENS PLACEMENT (IOC);  Surgeon: Birder Robson, MD;  Location: [facility] ORS;  Service: Ophthalmology;  Laterality: Right;  [place] 00:52.0 AP% 16.8 CDE 8.74 Fluid Pack Lot # [id number]  . CATARACT EXTRACTION W/PHACO Left 09/04/2017   Procedure: CATARACT EXTRACTION PHACO Alison INTRAOCULAR LENS PLACEMENT (IOC);  Surgeon: Birder Robson, MD;  Location: [facility] ORS;  Service: Ophthalmology;  Laterality: Left;  [place] 00:34 AP% 17.0 CDE 5.80 Fluid pack lot # [id number]  . CESAREAN SECTION    .  CHOLECYSTECTOMY    . FRACTURE SURGERY    . HIP SURGERY    . INTRAMEDULLARY (IM) NAIL INTERTROCHANTERIC Right 10/01/2016   Procedure: INTRAMEDULLARY (IM) NAIL INTERTROCHANTRIC;  Surgeon: Corky Mull, MD;  Location: [facility] ORS;  Service: Orthopedics;  Laterality: Right;  . KYPHOPLASTY N/A 08/20/2018   Procedure: [id number];  Surgeon: Hessie Knows, MD;  Location: [facility] ORS;  Service: Orthopedics;  Laterality: N/A;  . LOWER EXTREMITY ANGIOGRAPHY Right 12/18/2019   Procedure: LOWER EXTREMITY ANGIOGRAPHY;  Surgeon: Algernon Huxley, MD;  Location: [place] [facility];  Service: Cardiovascular;  Laterality: Right;  . LOWER EXTREMITY ANGIOGRAPHY Right 12/02/2020   Procedure: LOWER EXTREMITY ANGIOGRAPHY;  Surgeon: Algernon Huxley, MD;  Location: [place] [facility];  Service: Cardiovascular;  Laterality: Right;  . NECK SURGERY    . NOSE SURGERY    . ROTATOR CUFF REPAIR     x2  . SHOULDER ARTHROSCOPY  12/07/2011   Procedure: ARTHROSCOPY SHOULDER;  Surgeon: Ninetta Lights, MD;  Location: [place];  Service: Orthopedics;  Laterality: Right;  Debridement Partial Cuff Tear, Release Coracoacromial Ligament  . SHOULDER SURGERY  12/07/2011   right     Social History:   reports that she has been smoking cigarettes. She has been smoking about 0.00 packs per day. She has never used smokeless tobacco. She reports that she does not  drink alcohol Alison does not use drugs.   Family History:  Her family history includes Aneurysm in an other family member.   Allergies No Known Allergies   Home Medications  Prior to Admission medications   Medication Sig Start Date End Date Taking? Authorizing Provider  ALPRAZolam Duanne Moron) 0.5 MG tablet Take 0.5 mg by mouth every 6 (six) hours as needed for anxiety.   Yes [provider]  HYDROcodone-acetaminophen (NORCO/VICODIN) 5-325 MG tablet Take 1 tablet by mouth every 6 (six) hours as needed for moderate pain. 12/03/20   Kris Hartmann, NP  ALPRAZolam  Duanne Moron) 1 MG tablet Take 1 tablet (1 mg total) by mouth 4 (four) times daily as needed. Patient taking differently: Take 1 mg by mouth 2 (two) times daily as needed. 10/04/16   Epifanio Lesches, MD  amLODipine (NORVASC) 5 MG tablet Take 1 tablet (5 mg total) by mouth daily. 08/22/18   Gladstone Lighter, MD  aspirin EC 81 MG tablet Take 1 tablet (81 mg total) by mouth daily. 12/02/20   Algernon Huxley, MD  atorvastatin (LIPITOR) 10 MG tablet Take 1 tablet (10 mg total) by mouth daily. 12/18/19 12/17/20  Algernon Huxley, MD  buPROPion (WELLBUTRIN SR) 150 MG 12 hr tablet Take 150 mg by mouth daily.     [provider]  clopidogrel (PLAVIX) 75 MG tablet Take 1 tablet (75 mg total) by mouth daily. 12/18/19   Algernon Huxley, MD  escitalopram (LEXAPRO) 10 MG tablet Take 10 mg by mouth daily.  10/15/13   [provider]  Fluticasone-Salmeterol (ADVAIR DISKUS) 250-50 MCG/DOSE AEPB Inhale 1 puff into the lungs 2 (two) times daily. 10/24/18 03/16/20  Dustin Flock, MD  levothyroxine (SYNTHROID, LEVOTHROID) 88 MCG tablet Take 88 mcg by mouth daily before breakfast.     [provider]  montelukast (SINGULAIR) 10 MG tablet Take 10 mg by mouth daily.    [provider]  oxyCODONE (OXY IR/ROXICODONE) 5 MG immediate release tablet Take 1 tablet (5 mg total) by mouth 2 (two) times daily as needed for severe pain. Must last 30 days Patient not taking: Reported on 12/02/2020 11/11/20 12/11/20  Milinda Pointer, MD  oxyCODONE (OXY IR/ROXICODONE) 5 MG immediate release tablet Take 1 tablet (5 mg total) by mouth 2 (two) times daily as needed for severe pain. Must last 30 days 12/11/20 01/10/21  Milinda Pointer, MD  oxyCODONE (OXY IR/ROXICODONE) 5 MG immediate release tablet Take 1 tablet (5 mg total) by mouth 2 (two) times daily as needed for severe pain. Must last 30 days 01/10/21 02/09/21  Milinda Pointer, MD  tizanidine (ZANAFLEX) 2 MG capsule Take 1 capsule (2 mg total) by mouth 3 (three)  times daily as needed for muscle spasms. Patient not taking: No sig reported 11/03/20 02/01/21  Milinda Pointer, MD  traZODone (DESYREL) 100 MG tablet Take 1 tablet (100 mg total) by mouth at bedtime. Patient taking differently: Take 200 mg by mouth at bedtime. 08/21/18   Gladstone Lighter, MD     Critical care time: 79 minutes       Darel Hong, [facility] Pager: [phone]

## 2020-12-23 NOTE — Progress Notes (Signed)
Patient still in a significant amount of pain a couple of hours after morphine has been given. MD notified and frequency increased.

## 2020-12-23 NOTE — Progress Notes (Signed)
ANTICOAGULATION CONSULT NOTE  Pharmacy Consult for heparin Indication: s/p mechanical thrombectomy, plan for catheter directed thrombolytic therapy  Patient Measurements: Heparin Dosing Weight: 51 kg  Labs: Recent Labs    12/22/20 0913 12/23/20 0510 12/23/20 1038 12/23/20 1627  HGB 11.0* 10.5* 9.0* 9.6*  HCT 34.7* 33.1* 28.8* 31.6*  PLT 557* 385 370 358  HEPARINUNFRC  --   --  0.87* 0.20*  CREATININE 0.80 0.50  --   --     Estimated Creatinine Clearance: 50.7 mL/min (by C-G formula based on SCr of 0.5 mg/dL).   Medical History: Past Medical History:  Diagnosis Date  . Anxiety   . CAD (coronary artery disease)   . Carpal tunnel syndrome   . Cataract   . Complication of anesthesia    has woken up at times  . COPD (chronic obstructive pulmonary disease) ([place])   . CRP elevated 09/14/2015  . Depression   . Difficult intubation   . Dyslipidemia   . Dyspnea    DOE  . Dysrhythmia    hx palpatations  . Elevated sedimentation rate 09/14/2015  . Esophageal spasm   . Gastrointestinal parasites   . GERD (gastroesophageal reflux disease)   . Hiatal hernia   . History of peptic ulcer disease   . Hyperthyroidism   . Hypothyroidism   . Low magnesium levels 09/14/2015  . Pelvic fracture ([place]) 2008   fall from riding a horse  . Reflux   . Rotator cuff injury   . Stenosis, spinal, lumbar     Assessment: 75 year old female with known h/o atherosclerotic disease with gangrenous changes to her right foot. Patient is s/p mechanical thrombectomy with plan for catheter directed thrombolytic therapy. Pharmacy consult for heparin drip.  Goal of Therapy:  Heparin level 0.2-0.5 units/ml Monitor platelets by anticoagulation protocol: Yes   Plan:  --2/3 at 1627 HL = 0.2, therapeutic x 1. Will maintain heparin infusion at current rate of 600 units/hr --Follow-up HL / CBC in 6 hours as ordered  Benita Gutter 12/23/2020,4:55 PM

## 2020-12-23 NOTE — Op Note (Signed)
[name]  Percutaneous Study/Intervention Procedural Note   Date of Surgery: 12/23/2020  Surgeon(s):Richardo Popoff    Assistants:none  Pre-operative Diagnosis: PAD with gangrene right foot, s/p recent intervention  Post-operative diagnosis:  Same  Procedure(s) Performed:             1.  Ultrasound guidance for vascular access left femoral artery             2.  Catheter placement into right common femoral artery from left femoral approach             3.  Aortogram and selective right lower extremity angiogram             4.   Mechanical thrombectomy to the right SFA, popliteal artery, and anterior tibial artery with the penumbra cat 6 device             5.   Percutaneous transluminal angioplasty of right anterior tibial artery with 2.5 mm diameter by 30 cm length angioplasty balloon  6.  Catheter directed thrombolytic therapy with 4 mg of TPA instilled in the right SFA, popliteal artery, and anterior tibial artery with the infusion catheter which will be continued for overnight thrombolytic therapy             7.   Ultrasound guidance for vascular access left femoral vein  8.   Placement of a left femoral triple-lumen catheter for venous access with ultrasound guidance  EBL: 100 cc  Contrast: 55 cc  Fluoro Time: 7.7 minutes  Moderate Conscious Sedation Time: approximately 60 minutes using 5 mg of Versed and 100 mcg of Fentanyl              [name] The patient has noninvasive study showing a markedly reduced ABI of less than 0.4. The patient is brought in for angiography for further evaluation and potential treatment.  Due to the limb threatening nature of the situation, angiogram was performed for attempted limb salvage. The patient is aware that if the procedure fails, amputation would be expected.  The patient also understands that even with successful  revascularization, amputation may still be required due to the severity of the situation.  Risks and benefits are discussed and informed consent is obtained.   Procedure:  The patient was identified and appropriate procedural time out was performed.  The patient was then placed supine on the table and prepped and draped in the usual sterile fashion. Moderate conscious sedation was administered during a face to face encounter with the patient throughout the procedure with my supervision of the RN administering medicines and monitoring the patient's vital signs, pulse oximetry, telemetry and mental status throughout from the start of the procedure until the patient was taken to the recovery room. Ultrasound was used to evaluate the left common femoral artery.  It was patent .  A digital ultrasound image was acquired.  A Seldinger needle was used to access the left common femoral artery under direct ultrasound guidance and a permanent image was performed.  A 0.035 J wire was advanced without resistance and a 5Fr sheath was placed.  Pigtail catheter was placed into the aorta and an AP aortogram was performed. This demonstrated normal renal arteries and normal aorta and iliac segments without significant stenosis. I then crossed the aortic bifurcation and advanced to the right femoral head. Selective right lower extremity angiogram was  then performed. This demonstrated a relatively normal common femoral artery, profunda femoris artery, and proximal superficial femoral artery.  The mid to distal superficial femoral artery above the previously placed stents was occluded and the stents were occluded down through the SFA, popliteal artery, and proximal anterior tibial artery.  The occlusion in the anterior tibial artery continued for another 10 to 15 cm to the mid anterior tibial artery where there was reconstitution.  This was then continuous to the foot.  The posterior tibial artery also reconstituted through collaterals  but it had very poor filling of the foot.  The peroneal artery was not well seen. It was felt that it was in the patient's best interest to proceed with intervention after these images to avoid a second procedure and a larger amount of contrast and fluoroscopy based off of the findings from the initial angiogram. The patient was systemically heparinized and a 6 [place] destination sheath was then placed over the Terumo Advantage wire. I then used a Kumpe catheter and the advantage wire to easily navigate through the SFA, popliteal, and anterior tibial occlusion confirming intraluminal flow in the mid anterior tibial artery.  I then placed a 0.014 advantage wire.  2 passes with the penumbra cat 6 device throughout the right SFA, popliteal artery, and anterior tibial artery down well below the stented area into the proximal to mid anterior tibial artery were performed.  Significant thrombus was removed, but there remained no forward flow with occlusion of the vessels.  I performed angioplasty of the anterior tibial artery from the mid to distal segment up through the origin into the popliteal artery within the previously placed stents using a 2.5 mm diameter by 30 cm length angioplasty balloon inflated to 12 atm for 1 minute to see if improving runoff would provide some inline flow.  This did not.  There remained occlusion.  I felt our only hope for restoring patency would be continuous thrombolytic therapy at this point so a 135 cm total length 50 cm working length catheter was placed from the mid SFA down through the popliteal artery and the anterior and mid anterior tibial artery.  4 mg of TPA were instilled through this catheter and it was secured in place with silk suture to be used for continuous thrombolytic therapy.  Ultrasound was then used to visualize the left common femoral vein.  It was accessed under direct ultrasound guidance without difficulty with a Seldinger needle and a permanent image was recorded.   A J-wire was placed.  After skin nick and dilatation a triple-lumen catheter was placed over the wire and the wire was removed.  All 3 lm withdrew dark red nonpulsatile blood and flushed easily with heparinized saline.  It was secured to the skin with 2 silk sutures. I elected to terminate the procedure. The patient was taken to the recovery room in stable condition having tolerated the procedure well.  Findings:               Aortogram:  Normal renal arteries.  Normal aorta and iliac arteries without significant stenosis.             Right lower Extremity:  Relatively normal common femoral artery, profunda femoris artery, and proximal superficial femoral artery.  The mid to distal superficial femoral artery above the previously placed stents was occluded and the stents were occluded down through the SFA, popliteal artery, and proximal anterior tibial artery.  The occlusion in the anterior tibial artery continued for another  10 to 15 cm to the mid anterior tibial artery where there was reconstitution.  This was then continuous to the foot.  The posterior tibial artery also reconstituted through collaterals but it had very poor filling of the foot.  The peroneal artery was not well seen.   Disposition: Patient was taken to the recovery room in stable condition having tolerated the procedure well.  Complications: None  [name] 12/23/2020 10:34 AM   This note was created with Dragon Medical transcription system. Any errors in dictation are purely unintentional.

## 2020-12-24 ENCOUNTER — Encounter
Admission: EM | Disposition: A | Discharge status: Home Health | Payer: Self-pay | Source: Home / Self Care | Attending: Internal Medicine

## 2020-12-24 ENCOUNTER — Encounter: Payer: Self-pay | Admitting: Vascular Surgery

## 2020-12-24 DIAGNOSIS — M86171 Other acute osteomyelitis, right ankle and foot: Secondary | ICD-10-CM

## 2020-12-24 DIAGNOSIS — I70261 Atherosclerosis of native arteries of extremities with gangrene, right leg: Secondary | ICD-10-CM

## 2020-12-24 HISTORY — PX: LOWER EXTREMITY ANGIOGRAPHY: CATH118251

## 2020-12-24 LAB — CBC
HCT: 28.8 % — ABNORMAL LOW (ref 36.0–46.0)
HCT: 27.5 % — ABNORMAL LOW (ref 36.0–46.0)
HCT: 25.5 % — ABNORMAL LOW (ref 36.0–46.0)
Hemoglobin: 8.9 g/dL — ABNORMAL LOW (ref 12.0–15.0)
Hemoglobin: 8.8 g/dL — ABNORMAL LOW (ref 12.0–15.0)
Hemoglobin: 7.9 g/dL — ABNORMAL LOW (ref 12.0–15.0)
MCH: 27.8 pg (ref 26.0–34.0)
MCH: 29 pg (ref 26.0–34.0)
MCH: 28.3 pg (ref 26.0–34.0)
MCHC: 30.9 g/dL (ref 30.0–36.0)
MCHC: 32 g/dL (ref 30.0–36.0)
MCHC: 31 g/dL (ref 30.0–36.0)
MCV: 90 fL (ref 80.0–100.0)
MCV: 90.8 fL (ref 80.0–100.0)
MCV: 91.4 fL (ref 80.0–100.0)
Platelets: 347 10*3/uL (ref 150–400)
Platelets: 348 10*3/uL (ref 150–400)
Platelets: 315 10*3/uL (ref 150–400)
RBC: 3.2 MIL/uL — ABNORMAL LOW (ref 3.87–5.11)
RBC: 3.03 MIL/uL — ABNORMAL LOW (ref 3.87–5.11)
RBC: 2.79 MIL/uL — ABNORMAL LOW (ref 3.87–5.11)
RDW: 14.1 % (ref 11.5–15.5)
RDW: 14.3 % (ref 11.5–15.5)
RDW: 14.1 % (ref 11.5–15.5)
WBC: 10.3 10*3/uL (ref 4.0–10.5)
WBC: 9.7 10*3/uL (ref 4.0–10.5)
WBC: 9.8 10*3/uL (ref 4.0–10.5)
nRBC: 0 % (ref 0.0–0.2)
nRBC: 0 % (ref 0.0–0.2)
nRBC: 0 % (ref 0.0–0.2)

## 2020-12-24 LAB — CBC WITH DIFFERENTIAL/PLATELET
Abs Immature Granulocytes: 0.03 10*3/uL (ref 0.00–0.07)
Basophils Absolute: 0.1 10*3/uL (ref 0.0–0.1)
Basophils Relative: 1 %
Eosinophils Absolute: 0.3 10*3/uL (ref 0.0–0.5)
Eosinophils Relative: 4 %
HCT: 27.9 % — ABNORMAL LOW (ref 36.0–46.0)
Hemoglobin: 8.9 g/dL — ABNORMAL LOW (ref 12.0–15.0)
Immature Granulocytes: 0 %
Lymphocytes Relative: 14 %
Lymphs Abs: 1.1 10*3/uL (ref 0.7–4.0)
MCH: 28.7 pg (ref 26.0–34.0)
MCHC: 31.9 g/dL (ref 30.0–36.0)
MCV: 90 fL (ref 80.0–100.0)
Monocytes Absolute: 0.5 10*3/uL (ref 0.1–1.0)
Monocytes Relative: 6 %
Neutro Abs: 5.9 10*3/uL (ref 1.7–7.7)
Neutrophils Relative %: 75 %
Platelets: 321 10*3/uL (ref 150–400)
RBC: 3.1 MIL/uL — ABNORMAL LOW (ref 3.87–5.11)
RDW: 14.1 % (ref 11.5–15.5)
WBC: 7.9 10*3/uL (ref 4.0–10.5)
nRBC: 0 % (ref 0.0–0.2)

## 2020-12-24 LAB — GLUCOSE, CAPILLARY
Glucose-Capillary: 91 mg/dL (ref 70–99)
Glucose-Capillary: 86 mg/dL (ref 70–99)
Glucose-Capillary: 82 mg/dL (ref 70–99)
Glucose-Capillary: 89 mg/dL (ref 70–99)
Glucose-Capillary: 98 mg/dL (ref 70–99)

## 2020-12-24 LAB — BASIC METABOLIC PANEL
Anion gap: 10 (ref 5–15)
BUN: 5 mg/dL — ABNORMAL LOW (ref 8–23)
CO2: 19 mmol/L — ABNORMAL LOW (ref 22–32)
Calcium: 7.6 mg/dL — ABNORMAL LOW (ref 8.9–10.3)
Chloride: 108 mmol/L (ref 98–111)
Creatinine, Ser: 0.46 mg/dL (ref 0.44–1.00)
GFR, Estimated: >60 mL/min (ref >60)
Glucose, Bld: 89 mg/dL (ref 70–99)
Potassium: 3.6 mmol/L (ref 3.5–5.1)
Sodium: 137 mmol/L (ref 135–145)

## 2020-12-24 LAB — FIBRINOGEN: Fibrinogen: 366 mg/dL (ref 210–475)

## 2020-12-24 LAB — PHOSPHORUS: Phosphorus: 2.5 mg/dL (ref 2.5–4.6)

## 2020-12-24 LAB — HEPARIN LEVEL (UNFRACTIONATED): Heparin Unfractionated: <0.1 IU/mL — ABNORMAL LOW (ref 0.30–0.70)

## 2020-12-24 LAB — MAGNESIUM: Magnesium: 1.4 mg/dL — ABNORMAL LOW (ref 1.7–2.4)

## 2020-12-24 SURGERY — LOWER EXTREMITY ANGIOGRAPHY
Anesthesia: Moderate Sedation | Laterality: Right

## 2020-12-24 SURGERY — AMPUTATION, TOE
Anesthesia: Choice | Site: Toe | Laterality: Right

## 2020-12-24 MED ORDER — MIDODRINE HCL 5 MG PO TABS
5.0000 mg | ORAL_TABLET | Freq: Three times a day (TID) | ORAL | Status: DC
  Administered 2020-12-25 – 2020-12-26 (×4): 5 mg via ORAL
  Filled 2020-12-24 (×4): qty 1

## 2020-12-24 MED ORDER — DIPHENHYDRAMINE HCL 50 MG/ML IJ SOLN: 50.0000 mg | Freq: Once | INTRAMUSCULAR | Status: DC | PRN

## 2020-12-24 MED ORDER — MIDAZOLAM HCL 5 MG/5ML IJ SOLN
INTRAMUSCULAR | Status: AC
  Filled 2020-12-24: qty 5

## 2020-12-24 MED ORDER — OXYCODONE HCL 5 MG PO TABS
5.0000 mg | ORAL_TABLET | ORAL | Status: DC | PRN
  Administered 2020-12-24 – 2020-12-25 (×4): 10 mg via ORAL
  Administered 2020-12-26: 5 mg via ORAL
  Administered 2020-12-26 – 2020-12-27 (×6): 10 mg via ORAL
  Administered 2020-12-28 (×2): 5 mg via ORAL
  Administered 2020-12-28 (×2): 10 mg via ORAL
  Administered 2020-12-29 – 2020-12-30 (×6): 5 mg via ORAL
  Administered 2020-12-31 (×2): 10 mg via ORAL
  Administered 2020-12-31: 5 mg via ORAL
  Administered 2021-01-01: 10 mg via ORAL
  Administered 2021-01-01: 5 mg via ORAL
  Administered 2021-01-01: 10 mg via ORAL
  Filled 2020-12-24 (×2): qty 2
  Filled 2020-12-24 (×3): qty 1
  Filled 2020-12-24 (×2): qty 2
  Filled 2020-12-24 (×3): qty 1
  Filled 2020-12-24 (×7): qty 2
  Filled 2020-12-24 (×2): qty 1
  Filled 2020-12-24 (×2): qty 2
  Filled 2020-12-24: qty 1
  Filled 2020-12-24: qty 2
  Filled 2020-12-24: qty 1
  Filled 2020-12-24 (×2): qty 2
  Filled 2020-12-24: qty 1

## 2020-12-24 MED ORDER — DIPHENHYDRAMINE HCL 50 MG/ML IJ SOLN
INTRAMUSCULAR | Status: AC
  Filled 2020-12-24: qty 1

## 2020-12-24 MED ORDER — METHYLPREDNISOLONE SODIUM SUCC 125 MG IJ SOLR: 125.0000 mg | Freq: Once | INTRAMUSCULAR | Status: DC | PRN

## 2020-12-24 MED ORDER — FAMOTIDINE 20 MG PO TABS: 40.0000 mg | ORAL_TABLET | Freq: Once | ORAL | Status: DC | PRN

## 2020-12-24 MED ORDER — FENTANYL CITRATE (PF) 100 MCG/2ML IJ SOLN
INTRAMUSCULAR | Status: DC | PRN
  Administered 2020-12-24: 50 ug via INTRAVENOUS

## 2020-12-24 MED ORDER — CHLORDIAZEPOXIDE HCL 25 MG PO CAPS
25.0000 mg | ORAL_CAPSULE | Freq: Two times a day (BID) | ORAL | Status: DC
  Administered 2020-12-24 – 2020-12-29 (×10): 25 mg via ORAL
  Filled 2020-12-24 (×10): qty 1

## 2020-12-24 MED ORDER — MIDAZOLAM HCL 2 MG/ML PO SYRP: 8.0000 mg | ORAL_SOLUTION | Freq: Once | ORAL | Status: DC | PRN

## 2020-12-24 MED ORDER — HYDROMORPHONE HCL 1 MG/ML IJ SOLN
1.0000 mg | Freq: Once | INTRAMUSCULAR | Status: AC | PRN
  Administered 2020-12-24: 1 mg via INTRAVENOUS
  Filled 2020-12-24: qty 1

## 2020-12-24 MED ORDER — MIDAZOLAM HCL 2 MG/2ML IJ SOLN
INTRAMUSCULAR | Status: DC | PRN
  Administered 2020-12-24: 2 mg via INTRAVENOUS

## 2020-12-24 MED ORDER — CEFAZOLIN SODIUM-DEXTROSE 2-4 GM/100ML-% IV SOLN: 2.0000 g | Freq: Once | INTRAVENOUS | Status: AC

## 2020-12-24 MED ORDER — SODIUM CHLORIDE 0.9 % IV BOLUS: 500.0000 mL | Freq: Once | INTRAVENOUS | Status: AC

## 2020-12-24 MED ORDER — HEPARIN SODIUM (PORCINE) 1000 UNIT/ML IJ SOLN
INTRAMUSCULAR | Status: AC
  Filled 2020-12-24: qty 1

## 2020-12-24 MED ORDER — SODIUM CHLORIDE 0.9 % IV BOLUS
500.0000 mL | Freq: Once | INTRAVENOUS | Status: AC
  Administered 2020-12-24: 500 mL via INTRAVENOUS

## 2020-12-24 MED ORDER — TIROFIBAN (AGGRASTAT) BOLUS VIA INFUSION
25.0000 ug/kg | Freq: Once | INTRAVENOUS | Status: AC
  Administered 2020-12-24: 1282.5 ug via INTRAVENOUS
  Filled 2020-12-24: qty 26

## 2020-12-24 MED ORDER — HEPARIN SODIUM (PORCINE) 1000 UNIT/ML IJ SOLN
INTRAMUSCULAR | Status: DC | PRN
  Administered 2020-12-24: 3000 [IU] via INTRAVENOUS

## 2020-12-24 MED ORDER — FENTANYL CITRATE (PF) 100 MCG/2ML IJ SOLN
INTRAMUSCULAR | Status: AC
  Filled 2020-12-24: qty 2

## 2020-12-24 MED ORDER — HEPARIN (PORCINE) 25000 UT/250ML-% IV SOLN
1050.0000 [IU]/h | INTRAVENOUS | Status: AC
  Administered 2020-12-25: 650 [IU]/h via INTRAVENOUS
  Filled 2020-12-24: qty 250

## 2020-12-24 MED ORDER — IODIXANOL 320 MG/ML IV SOLN
INTRAVENOUS | Status: DC | PRN
  Administered 2020-12-24: 30 mL

## 2020-12-24 MED ORDER — NOREPINEPHRINE 16 MG/250ML-% IV SOLN
0.0000 ug/min | INTRAVENOUS | Status: DC
  Administered 2020-12-24 – 2020-12-25 (×2): 2 ug/min via INTRAVENOUS
  Filled 2020-12-24: qty 250

## 2020-12-24 MED ORDER — TIROFIBAN HCL IN NACL 5-0.9 MG/100ML-% IV SOLN
0.1500 ug/kg/min | INTRAVENOUS | Status: AC
  Administered 2020-12-24: 0.15 ug/kg/min via INTRAVENOUS
  Filled 2020-12-24 (×2): qty 100

## 2020-12-24 MED ORDER — ONDANSETRON HCL 4 MG/2ML IJ SOLN: 4.0000 mg | Freq: Four times a day (QID) | INTRAMUSCULAR | Status: DC | PRN

## 2020-12-24 MED ORDER — MAGNESIUM SULFATE 4 GM/100ML IV SOLN
4.0000 g | Freq: Once | INTRAVENOUS | Status: AC
  Administered 2020-12-24: 4 g via INTRAVENOUS
  Filled 2020-12-24: qty 100

## 2020-12-24 SURGICAL SUPPLY — 9 items
BALLN ULTRVRSE 3X100X150 (BALLOONS) ×2
BALLOON ULTRVRSE 3X100X150 (BALLOONS) IMPLANT
CANISTER PENUMBRA ENGINE (MISCELLANEOUS) ×1 IMPLANT
CATH INDIGO CAT6 KIT (CATHETERS) ×1 IMPLANT
DEVICE STARCLOSE SE CLOSURE (Vascular Products) ×1 IMPLANT
KIT ENCORE 26 ADVANTAGE (KITS) ×1 IMPLANT
PACK ANGIOGRAPHY (CUSTOM PROCEDURE TRAY) ×2 IMPLANT
WIRE G V18X300CM (WIRE) ×1 IMPLANT
WIRE GUIDERIGHT .035X150 (WIRE) ×1 IMPLANT

## 2020-12-24 NOTE — Progress Notes (Signed)
NAME:  [name], MRN:  [id number], DOB:  [birth year], LOS: 2 ADMISSION DATE:  12/22/2020, CONSULTATION DATE:  12/23/2020 REFERRING MD:  Hezzie Bump, CHIEF COMPLAINT:  Altered Mental Status, Pain to 3rd toe of Right Foot   Brief History:  75 y.o. Female admitted with Severe PVD w/ Gangrene to the Right 3rd toe.  Vascular Surgery and Podiatry consulted. On 12/23/20 she underwent Angiogram to the RLE with mechanical thrombectomy and catheter directed thrombolytic therapy to the Right SFA, popliteal artery, and anterior tibial artery.  She is being transferred to ICU as she requires overnight thrombolytic therapy via infusion catheter.  History of Present Illness:  And Hoh is a 75 year old female with a past medical history most notable for severe peripheral arterial disease status post recent (12/02/2020) mechanical thrombectomy and stent placement to the  right distal SFA and popliteal artery and recent dry gangrene of the right third toe who presented to [facility] ED on 12/22/2020 due to complaints of altered mental status and worsening pain in her right third toe.  She reported increased redness, pain, and swelling of her right third toe over the past week.  She rated her pain 10 out of 10, with progression of redness extending up to her right ankle with associated chills.  She denied fever, chest pain, shortness of breath, nausea, vomiting, diarrhea, palpitations, dizziness, abdominal pain, dysuria, or trauma to the area.  ER course: Upon presentation to the ED she was noted to be tachycardic and tachypneic, otherwise hemodynamically stable.  Labs showed sodium 141, potassium 3.0, chloride 105, bicarb 24, glucose 110, BUN 15, creatinine 0.8, calcium 8.6, alkaline phosphatase 76, albumin 3.2, AST 15, ALT 10, lactic acid 1.2, WBC 10.0, hemoglobin 11.0, hematocrit 34.7, platelets 557, ESR 47.  Her COVID-19 PCR is negative.  X-ray of the right foot showed erosive changes of the third PIP compatible with septic  arthritis and osteomyelitis.  She was given empiric IV antibiotics.  Hospitalist was asked to admit the patient for further work-up and treatment of osteomyelitis of the right third toe.  Vascular surgery and podiatry were consulted.  ABI of the lower extremities was obtained and revealed severe right lower extremity peripheral artery disease with right ABI of 0.36.  The left ankle-brachial index was normal.  On 12/23/2020 she underwent Angiogram to the RLE with mechanical thrombectomy and catheter directed thrombolytic therapy to the Right SFA, popliteal artery, and anterior tibial artery.  She is being transferred to ICU as she requires overnight thrombolytic therapy via infusion catheter.  PCCM is consulted as there is concern she may require Precedex infusion due to restlessness and need for bedrest/immobilization of the affected extremity.  2/04/-  Plan to transition from precedex to PO anxiolytic with medical optimization for transfer to med-surg.    Past Medical History:  COPD CAD Peripheral Artery Disease Dyslipidemia GERD Hypothyroidism Lumbar spinal stenosis Anxiety Depression  Significant Hospital Events:  2/2: Presented to ED, to be admitted by Hospitalist; Vascular Surgery and Podiatry consulted 2/3: Underwent Angiogram RLE, transfer to ICU post procedure for overnight thrombolytic therapy; PCCM consulted for possible Precedex  Consults:  Hospitalist (primary service) Vascular surgery Podiatry PCCM  Procedures:  2/3: Angiogram right lower extremity 2/3: Left femoral CVC placed  Significant Diagnostic Tests:  2/2: X-ray Right foot>>Erosive changes of the third PIP compatible with septic arthritis and osteomyelitis. 2/2: ABI Lower Extremities>>1. Severe right lower extremity peripheral artery disease with right ABI = 0.36. 2. Normal left ankle brachial index.  Micro Data:  2/2: SARS-CoV-2 PCR>> negative 2/2: HIV screen>> nonreactive 2/2: Blood culture  x2>> 2/2:MRSA PCR>> negative  Antimicrobials:  Cefepime x1 dose 2/2 Ceftriaxone 2/2>> Flagyl 2/2>> Vancomycin 3/2>>  Interim History / Subjective:  -Patient is status post angiogram to the right lower extremity with mechanical thrombectomy to the right SFA, popliteal artery and anterior tibial artery, catheter directed thrombolytic therapy -Patient is seen in the in the PACU following procedure, resting comfortably status post fentanyl and Versed administration -Afebrile, hemodynamically stable, on 4L nasal cannula  Objective   Blood pressure (!) 112/52, pulse 70, temperature 97.7 F (36.5 C), resp. rate 18, height 5' 4"  (1.626 m), weight 51.3 kg, SpO2 96 %.        Intake/Output Summary (Last 24 hours) at 12/24/2020 1044 Last data filed at 12/24/2020 0734 Gross per 24 hour  Intake 4601.61 ml  Output 700 ml  Net 3901.61 ml   Filed Weights   12/22/20 0810 12/23/20 0907 12/23/20 1232  Weight: 59 kg 59 kg 51.3 kg    Examination: General: Acutely ill-appearing female, laying in the bed, on 4 L nasal cannula, no acute distress HENT: Atraumatic, normocephalic, neck supple, no JVD Lungs: Coarse breath sounds to bilateral lower lobes, otherwise clear, even, nonlabored Cardiovascular: Regular rate and rhythm, S1-S2, no murmurs, rubs, gallops Abdomen: Soft, nontender, nondistended, no guarding or rebound tenderness, bowel sounds positive x4 Extremities: Left foot without ulceration or gangrenous changes and diminished dorsalis pedis and posterior tibial pulses; right foot with gangrenous right third toe, absent dorsalis pedis and posterior tibial pulses Neuro: Sleeping soundly status post fentanyl Versed administration, pupils PERRLA Skin: Gangrene to the right third toe      Resolved Hospital Problem list   N/A  Assessment & Plan:    Gangrene of the Right Third Toe Severe Peripheral Vascular Disease -Monitor fever curve -Trend WBC's -Follow cultures as above -Continue  Rocephin, Flagyl, and Vancomycin for now pending cultures and sensitivities -Vascular Surgery and Podiatry following, appreciate input -S/p Angiogram of RLE on 12/23/20 with Mechanical thrombectomy and catheter directed thrombolytic therapy to the Right SFA, popliteal artery, and anterior tibial artery -To undergo Thrombolytic therapy overnight in ICU with repeat Angiogram tomorrow 12/24/20 -Pain control -Precedex if needed to ensure pt relaxed and able to tolerate bedrest -Tentative plan for Amputation as some point as per Podiatry   History of COPD, without Acute Exacerbation -Supplemental O2 as needed to maintain O2 sats 88 to 94% -Follow intermittent CXR & ABG as needed -PRN Bronchodilators -Continue Dulera and Singulair -Encourage smoking cessation   Best practice (evaluated daily)  Diet: NPO Pain/Anxiety/Delirium protocol (if indicated): Dilaudid, Precedex if needed VAP protocol (if indicated): N/A DVT prophylaxis: Heparin gtt GI prophylaxis: N/A Glucose control: N/A Mobility: Bedrest Disposition:ICU  Goals of Care:  Last date of multidisciplinary goals of care discussion:N/A Family and staff present: Updated pt at bedside 12/23/20 Summary of discussion: Overnight infusion of tPA with return for Angiogram tomorrow Follow up goals of care discussion due: 12/24/20 Code Status: Full Code  Labs   CBC: Recent Labs  Lab 12/22/20 0913 12/23/20 0510 12/23/20 1038 12/23/20 1627 12/23/20 2233 12/24/20 0455  WBC 10.0 8.1 7.8 7.9 10.8* 7.9  NEUTROABS 7.8*  --   --   --   --  5.9  HGB 11.0* 10.5* 9.0* 9.6* 9.4* 8.9*  HCT 34.7* 33.1* 28.8* 31.6* 30.3* 27.9*  MCV 87.8 91.4 90.3 91.6 91.0 90.0  PLT 557* 385 370 358 362 174    Basic Metabolic Panel: Recent Labs  Lab 12/22/20 0913 12/23/20 0510 12/24/20 0441  NA 141 141 137  K 3.0* 4.2 3.6  CL 105 112* 108  CO2 24 22 19*  GLUCOSE 110* 63* 89  BUN 15 9 5*  CREATININE 0.80 0.50 0.46  CALCIUM 8.6* 7.9* 7.6*  MG  --   --   1.4*  PHOS  --   --  2.5   GFR: Estimated Creatinine Clearance: 50.7 mL/min (by C-G formula based on SCr of 0.46 mg/dL). Recent Labs  Lab 12/22/20 0913 12/23/20 0510 12/23/20 1038 12/23/20 1627 12/23/20 2233 12/24/20 0455  WBC 10.0   < > 7.8 7.9 10.8* 7.9  LATICACIDVEN 1.2  --   --   --   --   --    < > = values in this interval not displayed.    Liver Function Tests: Recent Labs  Lab 12/22/20 0913  AST 15  ALT 10  ALKPHOS 76  BILITOT 0.4  PROT 6.9  ALBUMIN 3.2*   No results for input(s): LIPASE, AMYLASE in the last 168 hours. No results for input(s): AMMONIA in the last 168 hours.  ABG    Component Value Date/Time   TCO2 26 12/07/2011 1042     Coagulation Profile: No results for input(s): INR, PROTIME in the last 168 hours.  Cardiac Enzymes: No results for input(s): CKTOTAL, CKMB, CKMBINDEX, TROPONINI in the last 168 hours.  HbA1C: No results found for: HGBA1C  CBG: Recent Labs  Lab 12/23/20 1255 12/23/20 1956 12/23/20 2336 12/24/20 0317 12/24/20 0946  GLUCAP 174* 52* 99 91 86    Review of Systems:   Unable to assess due to lethargic following fentanyl and versed administration   Past Medical History:  She,  has a past medical history of Anxiety, CAD (coronary artery disease), Carpal tunnel syndrome, Cataract, Complication of anesthesia, COPD (chronic obstructive pulmonary disease) ([place]), CRP elevated (09/14/2015), Depression, Difficult intubation, Dyslipidemia, Dyspnea, Dysrhythmia, Elevated sedimentation rate (09/14/2015), Esophageal spasm, Gastrointestinal parasites, GERD (gastroesophageal reflux disease), Hiatal hernia, History of peptic ulcer disease, Hyperthyroidism, Hypothyroidism, Low magnesium levels (09/14/2015), Pelvic fracture ([place]) (2008), Reflux, Rotator cuff injury, and Stenosis, spinal, lumbar.   Surgical History:   Past Surgical History:  Procedure Laterality Date  . APPENDECTOMY    . BACK SURGERY     CERVICAL FUSION  . CARPAL  TUNNEL RELEASE    . CATARACT EXTRACTION W/PHACO Right 08/07/2017   Procedure: CATARACT EXTRACTION PHACO AND INTRAOCULAR LENS PLACEMENT (IOC);  Surgeon: Birder Robson, MD;  Location: [facility] ORS;  Service: Ophthalmology;  Laterality: Right;  [place] 00:52.0 AP% 16.8 CDE 8.74 Fluid Pack Lot # [id number]  . CATARACT EXTRACTION W/PHACO Left 09/04/2017   Procedure: CATARACT EXTRACTION PHACO AND INTRAOCULAR LENS PLACEMENT (IOC);  Surgeon: Birder Robson, MD;  Location: [facility];  Service: Ophthalmology;  Laterality: Left;  [place] 00:34 AP% 17.0 CDE 5.80 Fluid pack lot # [id number]  . CESAREAN SECTION    . CHOLECYSTECTOMY    . FRACTURE SURGERY    . HIP SURGERY    . INTRAMEDULLARY (IM) NAIL INTERTROCHANTERIC Right 10/01/2016   Procedure: INTRAMEDULLARY (IM) NAIL INTERTROCHANTRIC;  Surgeon: Corky Mull, MD;  Location: [facility];  Service: Orthopedics;  Laterality: Right;  . KYPHOPLASTY N/A 08/20/2018   Procedure: [id number];  Surgeon: Hessie Knows, MD;  Location: [facility];  Service: Orthopedics;  Laterality: N/A;  . LOWER EXTREMITY ANGIOGRAPHY Right 12/18/2019   Procedure: LOWER EXTREMITY ANGIOGRAPHY;  Surgeon: Algernon Huxley, MD;  Location: [place] [facility];  Service: Cardiovascular;  Laterality:  Right;  Marland Kitchen LOWER EXTREMITY ANGIOGRAPHY Right 12/02/2020   Procedure: LOWER EXTREMITY ANGIOGRAPHY;  Surgeon: Algernon Huxley, MD;  Location: [place] [facility];  Service: Cardiovascular;  Laterality: Right;  . LOWER EXTREMITY ANGIOGRAPHY Right 12/23/2020   Procedure: Lower Extremity Angiography;  Surgeon: Algernon Huxley, MD;  Location: [place] [facility];  Service: Cardiovascular;  Laterality: Right;  . LOWER EXTREMITY ANGIOGRAPHY Right 12/24/2020   Procedure: Lower Extremity Angiography;  Surgeon: Algernon Huxley, MD;  Location: [place] [facility];  Service: Cardiovascular;  Laterality: Right;  . NECK SURGERY    . NOSE SURGERY    . ROTATOR CUFF REPAIR     x2  . SHOULDER ARTHROSCOPY  12/07/2011   Procedure:  ARTHROSCOPY SHOULDER;  Surgeon: Ninetta Lights, MD;  Location: [place];  Service: Orthopedics;  Laterality: Right;  Debridement Partial Cuff Tear, Release Coracoacromial Ligament  . SHOULDER SURGERY  12/07/2011   right     Social History:   reports that she has been smoking cigarettes. She has been smoking about 0.00 packs per day. She has never used smokeless tobacco. She reports that she does not drink alcohol and does not use drugs.   Family History:  Her family history includes Aneurysm in an other family member.   Allergies No Known Allergies   Home Medications  Prior to Admission medications   Medication Sig Start Date End Date Taking? Authorizing Provider  ALPRAZolam Duanne Moron) 0.5 MG tablet Take 0.5 mg by mouth every 6 (six) hours as needed for anxiety.   Yes [provider]  HYDROcodone-acetaminophen (NORCO/VICODIN) 5-325 MG tablet Take 1 tablet by mouth every 6 (six) hours as needed for moderate pain. 12/03/20   Kris Hartmann, NP  ALPRAZolam Duanne Moron) 1 MG tablet Take 1 tablet (1 mg total) by mouth 4 (four) times daily as needed. Patient taking differently: Take 1 mg by mouth 2 (two) times daily as needed. 10/04/16   Epifanio Lesches, MD  amLODipine (NORVASC) 5 MG tablet Take 1 tablet (5 mg total) by mouth daily. 08/22/18   Gladstone Lighter, MD  aspirin EC 81 MG tablet Take 1 tablet (81 mg total) by mouth daily. 12/02/20   Algernon Huxley, MD  atorvastatin (LIPITOR) 10 MG tablet Take 1 tablet (10 mg total) by mouth daily. 12/18/19 12/17/20  Algernon Huxley, MD  buPROPion (WELLBUTRIN SR) 150 MG 12 hr tablet Take 150 mg by mouth daily.     [provider]  clopidogrel (PLAVIX) 75 MG tablet Take 1 tablet (75 mg total) by mouth daily. 12/18/19   Algernon Huxley, MD  escitalopram (LEXAPRO) 10 MG tablet Take 10 mg by mouth daily.  10/15/13   [provider]  Fluticasone-Salmeterol (ADVAIR DISKUS) 250-50 MCG/DOSE AEPB Inhale 1 puff into the lungs 2 (two)  times daily. 10/24/18 03/16/20  Dustin Flock, MD  levothyroxine (SYNTHROID, LEVOTHROID) 88 MCG tablet Take 88 mcg by mouth daily before breakfast.     [provider]  montelukast (SINGULAIR) 10 MG tablet Take 10 mg by mouth daily.    [provider]  oxyCODONE (OXY IR/ROXICODONE) 5 MG immediate release tablet Take 1 tablet (5 mg total) by mouth 2 (two) times daily as needed for severe pain. Must last 30 days Patient not taking: Reported on 12/02/2020 11/11/20 12/11/20  Milinda Pointer, MD  oxyCODONE (OXY IR/ROXICODONE) 5 MG immediate release tablet Take 1 tablet (5 mg total) by mouth 2 (two) times daily as needed for severe pain. Must  last 30 days 12/11/20 01/10/21  Milinda Pointer, MD  oxyCODONE (OXY IR/ROXICODONE) 5 MG immediate release tablet Take 1 tablet (5 mg total) by mouth 2 (two) times daily as needed for severe pain. Must last 30 days 01/10/21 02/09/21  Milinda Pointer, MD  tizanidine (ZANAFLEX) 2 MG capsule Take 1 capsule (2 mg total) by mouth 3 (three) times daily as needed for muscle spasms. Patient not taking: No sig reported 11/03/20 02/01/21  Milinda Pointer, MD  traZODone (DESYREL) 100 MG tablet Take 1 tablet (100 mg total) by mouth at bedtime. Patient taking differently: Take 200 mg by mouth at bedtime. 08/21/18   Gladstone Lighter, MD     Critical care time: 33 minutes    Critical care provider statement:    Critical care time (minutes):  33   Critical care time was exclusive of:  Separately billable procedures and  treating other patients   Critical care was necessary to treat or prevent imminent or  life-threatening deterioration of the following conditions:     Critical care was time spent personally by me on the following  activities:  Development of treatment plan with patient or surrogate,  discussions with consultants, evaluation of patient's response to  treatment, examination of patient, obtaining history from patient or  surrogate,  ordering and performing treatments and interventions, ordering  and review of laboratory studies and re-evaluation of patient's condition   I assumed direction of critical care for this patient from another  provider in my specialty: no     Ottie Glazier, M.D.  [facility] [place]

## 2020-12-24 NOTE — OR Nursing (Signed)
precedex was stopped before pt delivered to specials recovery, restarted per Dr Lucky Cowboy order in vascular lab.

## 2020-12-24 NOTE — Progress Notes (Addendum)
ANTICOAGULATION CONSULT NOTE  Pharmacy Consult for heparin Indication: s/p mechanical thrombectomy, plan for catheter directed thrombolytic therapy  Patient Measurements: Heparin Dosing Weight: 51 kg  Labs: Recent Labs    12/22/20 0913 12/23/20 0510 12/23/20 1038 12/23/20 1627 12/23/20 2233 12/24/20 0441 12/24/20 0455  HGB 11.0* 10.5*   < > 9.6* 9.4*  --  8.9*  HCT 34.7* 33.1*   < > 31.6* 30.3*  --  27.9*  PLT 557* 385   < > 358 362  --  321  HEPARINUNFRC  --   --    < > 0.20* 0.12* <0.10*  --   CREATININE 0.80 0.50  --   --   --  0.46  --    < > = values in this interval not displayed.    Estimated Creatinine Clearance: 50.7 mL/min (by C-G formula based on SCr of 0.46 mg/dL).   Medical History: Past Medical History:  Diagnosis Date  . Anxiety   . CAD (coronary artery disease)   . Carpal tunnel syndrome   . Cataract   . Complication of anesthesia    has woken up at times  . COPD (chronic obstructive pulmonary disease) ([place])   . CRP elevated 09/14/2015  . Depression   . Difficult intubation   . Dyslipidemia   . Dyspnea    DOE  . Dysrhythmia    hx palpatations  . Elevated sedimentation rate 09/14/2015  . Esophageal spasm   . Gastrointestinal parasites   . GERD (gastroesophageal reflux disease)   . Hiatal hernia   . History of peptic ulcer disease   . Hyperthyroidism   . Hypothyroidism   . Low magnesium levels 09/14/2015  . Pelvic fracture ([place]) 2008   fall from riding a horse  . Reflux   . Rotator cuff injury   . Stenosis, spinal, lumbar     Assessment: 75 year old female with known h/o atherosclerotic disease with gangrenous changes to her right foot. Patient is s/p mechanical thrombectomy with plan for catheter directed thrombolytic therapy. Pharmacy consult for heparin drip.  Goal of Therapy:  Heparin level 0.2-0.5 units/ml Monitor platelets by anticoagulation protocol: Yes   Plan:  2/4:  HL @ 0441 = < 0.1 Will increase heparin drip to 750  units/hr.   Current orders are for HL X 4 .  Need to f/u plans for additional heparin levels.   Stepheny Canal D 12/24/2020,6:20 AM

## 2020-12-24 NOTE — Op Note (Signed)
[facility]  Percutaneous Study/Intervention Procedural Note   Date of Surgery: 12/24/2020  Surgeon(s):Jabe Jeanbaptiste    Assistants:none  Pre-operative Diagnosis: PAD with gangrene right foot  Post-operative diagnosis:  Same  Procedure(s) Performed:             1.   Right lower extremity angiogram             2.   Mechanical thrombectomy with the penumbra cat 6 device to the right SFA             3.   Percutaneous transluminal angioplasty of the right anterior tibial artery with 3 mm diameter angioplasty balloon             4.   StarClose closure device left femoral artery  EBL: 100  Contrast: 30 cc  Fluoro Time: 1.6 minutes  Moderate Conscious Sedation Time: approximately 15 minutes using 2 mg of Versed and 50 mcg of Fentanyl              Indications:  Patient is a 75 y.o.female with gangrene of the right foot and rest pain. The patient has been getting overnight thrombolytic therapy.  The patient is brought in for angiography for further evaluation and potential treatment.  Due to the limb threatening nature of the situation, angiogram was performed for attempted limb salvage. The patient is aware that if the procedure fails, amputation would be expected.  The patient also understands that even with successful revascularization, amputation may still be required due to the severity of the situation. Risks and benefits are discussed and informed consent is obtained.   Procedure:  The patient was identified and appropriate procedural time out was performed.  The patient was then placed supine on the table and prepped and draped in the usual sterile fashion. Moderate conscious sedation was administered during a face to face encounter with the patient throughout the procedure with my supervision of the RN administering medicines and monitoring the patient's vital signs, pulse oximetry, telemetry and mental status throughout from the start of the procedure until the  patient was taken to the recovery room.. Selective right lower extremity angiogram was then performed. This demonstrated a blob of thrombus which was causing at least moderate occlusion just below the stent in the proximal to mid SFA.  The remainder of the SFA and popliteal arteries were then patent.  The anterior tibial artery had stenosis and thrombus just below the previously placed stent creating moderate stenosis in the 65 to 70% range.  There was still collaterals filling a posterior tibial artery and the anterior tibial artery was somewhat sluggish down to the foot but did not appear to have focal stenosis beyond this proximal lesion. It was felt that it was in the patient's best interest to proceed with intervention after these images to avoid a second procedure and a larger amount of contrast and fluoroscopy based off of the findings from the initial angiogram. The patient was systemically heparinized and a V 18 wire was placed through the existing sheath and parked in the right anterior tibial artery in the foot. I then performed angioplasty of the anterior tibial artery with a 3 mm diameter by 10 cm length angioplasty balloon inflated to 8 atm for 1 minute.  Completion imaging showed less than 10% stenosis following this angioplasty.  I then perform mechanical thrombectomy running the penumbra cat 6 device in the right SFA.  Initially, the settled on the thrombus and after about 30 seconds  the seem to resolve and have brisk flow through the SFA.  Completion imaging showed no residual thrombus in the right SFA with only mild disease. I elected to terminate the procedure. The sheath was removed and StarClose closure device was deployed in the left femoral artery with excellent hemostatic result. The patient was taken to the recovery room in stable condition having tolerated the procedure well.  Findings:                            Right lower Extremity:  This demonstrated a blob of thrombus which was  causing at least moderate occlusion just below the stent in the proximal to mid SFA.  The remainder of the SFA and popliteal arteries were then patent.  The anterior tibial artery had stenosis and thrombus just below the previously placed stent creating moderate stenosis in the 65 to 70% range.  There was still collaterals filling a posterior tibial artery and the anterior tibial artery was somewhat sluggish down to the foot but did not appear to have focal stenosis beyond this proximal lesion   Disposition: Patient was taken to the recovery room in stable condition having tolerated the procedure well.  Complications: None  Leotis Pain 12/24/2020 8:14 AM   This note was created with Dragon Medical transcription system. Any errors in dictation are purely unintentional.

## 2020-12-24 NOTE — Progress Notes (Signed)
ANTICOAGULATION CONSULT NOTE  Pharmacy Consult for heparin Indication: s/p mechanical thrombectomy, plan for catheter directed thrombolytic therapy  Patient Measurements: Heparin Dosing Weight: 51 kg  Labs: Recent Labs    12/22/20 0913 12/23/20 0510 12/23/20 1038 12/23/20 1627 12/23/20 2233  HGB 11.0* 10.5* 9.0* 9.6* 9.4*  HCT 34.7* 33.1* 28.8* 31.6* 30.3*  PLT 557* 385 370 358 362  HEPARINUNFRC  --   --  0.87* 0.20* 0.12*  CREATININE 0.80 0.50  --   --   --     Estimated Creatinine Clearance: 50.7 mL/min (by C-G formula based on SCr of 0.5 mg/dL).   Medical History: Past Medical History:  Diagnosis Date  . Anxiety   . CAD (coronary artery disease)   . Carpal tunnel syndrome   . Cataract   . Complication of anesthesia    has woken up at times  . COPD (chronic obstructive pulmonary disease) ([place])   . CRP elevated 09/14/2015  . Depression   . Difficult intubation   . Dyslipidemia   . Dyspnea    DOE  . Dysrhythmia    hx palpatations  . Elevated sedimentation rate 09/14/2015  . Esophageal spasm   . Gastrointestinal parasites   . GERD (gastroesophageal reflux disease)   . Hiatal hernia   . History of peptic ulcer disease   . Hyperthyroidism   . Hypothyroidism   . Low magnesium levels 09/14/2015  . Pelvic fracture ([place]) 2008   fall from riding a horse  . Reflux   . Rotator cuff injury   . Stenosis, spinal, lumbar     Assessment: 75 year old female with known h/o atherosclerotic disease with gangrenous changes to her right foot. Patient is s/p mechanical thrombectomy with plan for catheter directed thrombolytic therapy. Pharmacy consult for heparin drip.  Goal of Therapy:  Heparin level 0.2-0.5 units/ml Monitor platelets by anticoagulation protocol: Yes   Plan:  2/3:  HL @ 2233 = 0.12 Will increase heparin drip to 650 units/hr.  Will recheck HL 8 hrs after rate change.   Anthony Roland D 12/24/2020,12:37 AM

## 2020-12-24 NOTE — Progress Notes (Signed)
Date of Admission:  12/22/2020    ID: [name] is a 75 y.o. 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  Also had mechanical thrombectomy to the right SFA.  And angioplasty balloon of the right anterior tibial artery.  Medications:  . amLODipine  5 mg Oral Daily  . atorvastatin  10 mg Oral Daily  . buPROPion  150 mg Oral Daily  . chlordiazePOXIDE  25 mg Oral BID  . Chlorhexidine Gluconate Cloth  6 each Topical Daily  . escitalopram  10 mg Oral Daily  . fentaNYL      . heparin sodium (porcine)      . influenza vaccine adjuvanted  0.5 mL Intramuscular Tomorrow-1000  . levothyroxine  88 mcg Oral QAC breakfast  . midazolam      . mometasone-formoterol  2 puff Inhalation BID  . montelukast  10 mg Oral Daily  . nicotine  14 mg Transdermal Daily  . pneumococcal 23 valent vaccine  0.5 mL Intramuscular Tomorrow-1000  . sodium chloride flush  3 mL Intravenous Q12H  . traZODone  200 mg Oral QHS    Objective: Vital signs in last 24 hours: Temp:  [97.7 F (36.5 C)-98.1 F (36.7 C)] 97.9 F (36.6 C) (02/04 0900) Pulse Rate:  [60-88] 68 (02/04 1430) Resp:  [14-24] 22 (02/04 1430) BP: (86-144)/(49-82) 99/56 (02/04 1430) SpO2:  [88 %-100 %] 89 % (02/04 1430)  PHYSICAL EXAM:  General: Alert, cooperative, no distress, hard of hearing Lungs: Clear to auscultation bilaterally. No Wheezing or Rhonchi. No rales. Heart: Regular rate and rhythm, no murmur, rub or gallop. Abdomen: Soft, non-tender,not distended. Bowel sounds normal. No masses Extremities:dry gangrene rt foot- third toe    Skin: No rashes or lesions. Or bruising Lymph: Cervical, supraclavicular normal. Neurologic: Grossly non-focal  Lab  Results Recent Labs    12/23/20 0510 12/23/20 1038 12/24/20 0441 12/24/20 0455 12/24/20 1059  WBC 8.1   < >  --  7.9 10.3  HGB 10.5*   < >  --  8.9* 8.9*  HCT 33.1*   < >  --  27.9* 28.8*  NA 141  --  137  --   --   K 4.2  --  3.6  --   --   CL 112*  --  108  --   --   CO2 22  --  19*  --   --   BUN 9  --  5*  --   --   CREATININE 0.50  --  0.46  --   --    < > = values in this interval not displayed.   Liver Panel Recent Labs    12/22/20 0913  PROT 6.9  ALBUMIN 3.2*  AST 15  ALT 10  ALKPHOS 76  BILITOT 0.4   Sedimentation Rate Recent Labs    12/22/20 1649  ESRSEDRATE 42*   C-Reactive Protein Recent Labs    12/22/20 0913 12/22/20 1649  CRP 2.1* 1.7*    Microbiology:  Studies/Results: PERIPHERAL VASCULAR CATHETERIZATION  Result Date: 12/24/2020 See op note  PERIPHERAL VASCULAR CATHETERIZATION  Result Date: 12/23/2020 See op note  US ARTERIAL ABI (SCREENING LOWER EXTREMITY)  Result Date: 12/22/2020 CLINICAL  DATA:  75 year old female with necrotic right third toe, leg pain, claudication. EXAM: NONINVASIVE PHYSIOLOGIC VASCULAR STUDY OF BILATERAL LOWER EXTREMITIES TECHNIQUE: Evaluation of both lower extremities were performed at rest, including calculation of ankle-brachial indices with single level Doppler, pressure and pulse volume recording. COMPARISON:  None. FINDINGS: Right ABI:  0.36 Left ABI:  0.95 Right Lower Extremity: Monophasic, dampened waveforms at the level of the ankle. Left Lower Extremity:  Normal arterial waveforms at the ankle. < 0.5 Severe PAD IMPRESSION: 1. Severe right lower extremity peripheral artery disease with right ABI = 0.36. 2. Normal left ankle brachial index. Ruthann Cancer, MD [facility] Electronically Signed   By: Ruthann Cancer MD   On: 12/22/2020 16:35     Assessment/Plan:  Dry gangrene of the right third toe of the right foot.  She has peripheral vascular disease and  has undergone thrombectomy and also got TPA locally.  Today she went back for mechanical thrombectomy of the right SFA.   There is no obvious infection at the site of the gangrene. She is currently on cefazolin. There is a plan for amputation of the toe.  Current smoker  COPD  Hypothyroidism on Synthroid  Hypertension on amlodipine  Depression and anxiety on Lexapro, trazodone, bupropion and Xanax. Also started on Librium.  ID will follow her peripherally this weekend call if needed.

## 2020-12-24 NOTE — OR Nursing (Signed)
precedex stopped on completion of procedure

## 2020-12-24 NOTE — Progress Notes (Signed)
ANTICOAGULATION CONSULT NOTE  Pharmacy Consult for heparin Indication: s/p mechanical thrombectomy and catheter directed thrombolytic therapy  Patient Measurements: Heparin Dosing Weight: 51 kg  Labs: Recent Labs    12/22/20 0913 12/23/20 0510 12/23/20 1038 12/23/20 1627 12/23/20 2233 12/24/20 0441 12/24/20 0455 12/24/20 1059  HGB 11.0* 10.5*   < > 9.6* 9.4*  --  8.9* 8.9*  HCT 34.7* 33.1*   < > 31.6* 30.3*  --  27.9* 28.8*  PLT 557* 385   < > 358 362  --  321 347  HEPARINUNFRC  --   --    < > 0.20* 0.12* <0.10*  --   --   CREATININE 0.80 0.50  --   --   --  0.46  --   --    < > = values in this interval not displayed.    Estimated Creatinine Clearance: 50.7 mL/min (by C-G formula based on SCr of 0.46 mg/dL).   Medical History: Past Medical History:  Diagnosis Date  . Anxiety   . CAD (coronary artery disease)   . Carpal tunnel syndrome   . Cataract   . Complication of anesthesia    has woken up at times  . COPD (chronic obstructive pulmonary disease) ([place])   . CRP elevated 09/14/2015  . Depression   . Difficult intubation   . Dyslipidemia   . Dyspnea    DOE  . Dysrhythmia    hx palpatations  . Elevated sedimentation rate 09/14/2015  . Esophageal spasm   . Gastrointestinal parasites   . GERD (gastroesophageal reflux disease)   . Hiatal hernia   . History of peptic ulcer disease   . Hyperthyroidism   . Hypothyroidism   . Low magnesium levels 09/14/2015  . Pelvic fracture ([place]) 2008   fall from riding a horse  . Reflux   . Rotator cuff injury   . Stenosis, spinal, lumbar     Assessment: 75 year old female with known h/o atherosclerotic disease with gangrenous changes to her right foot. Patient is s/p mechanical thrombectomy and catheter directed thrombolytic therapy. Patient started on heparin drip and then transitioned to Aggrastat post-procedure. Pharmacy consult for transition back to heparin drip.  Goal of Therapy:  Heparin level 0.3-0.7  units/ml Monitor platelets by anticoagulation protocol: Yes   Plan:  Per discussion with Vascular, plan for Aggrastat x 18 hrs with transition back to heparin drip after. Aggrastat to end ~ 0230 2/5. Will follow with heparin drip at 650 units/hr to start ~ 0330 (no initial bolus). Check HL 6 hrs after start of infusion. CBC daily while on heparin drip.  Tawnya Crook, PharmD 12/24/2020,2:50 PM

## 2020-12-25 LAB — BASIC METABOLIC PANEL
Anion gap: 8 (ref 5–15)
BUN: <5 mg/dL — ABNORMAL LOW (ref 8–23)
CO2: 21 mmol/L — ABNORMAL LOW (ref 22–32)
Calcium: 7.5 mg/dL — ABNORMAL LOW (ref 8.9–10.3)
Chloride: 110 mmol/L (ref 98–111)
Creatinine, Ser: 0.58 mg/dL (ref 0.44–1.00)
GFR, Estimated: >60 mL/min (ref >60)
Glucose, Bld: 90 mg/dL (ref 70–99)
Potassium: 3.4 mmol/L — ABNORMAL LOW (ref 3.5–5.1)
Sodium: 139 mmol/L (ref 135–145)

## 2020-12-25 LAB — GLUCOSE, CAPILLARY
Glucose-Capillary: 120 mg/dL — ABNORMAL HIGH (ref 70–99)
Glucose-Capillary: 121 mg/dL — ABNORMAL HIGH (ref 70–99)
Glucose-Capillary: 81 mg/dL (ref 70–99)
Glucose-Capillary: 82 mg/dL (ref 70–99)
Glucose-Capillary: 88 mg/dL (ref 70–99)
Glucose-Capillary: 92 mg/dL (ref 70–99)
Glucose-Capillary: 93 mg/dL (ref 70–99)

## 2020-12-25 LAB — PROCALCITONIN: Procalcitonin: 1.26 ng/mL

## 2020-12-25 LAB — CBC
HCT: 25.1 % — ABNORMAL LOW (ref 36.0–46.0)
Hemoglobin: 7.8 g/dL — ABNORMAL LOW (ref 12.0–15.0)
MCH: 28 pg (ref 26.0–34.0)
MCHC: 31.1 g/dL (ref 30.0–36.0)
MCV: 90 fL (ref 80.0–100.0)
Platelets: 335 10*3/uL (ref 150–400)
RBC: 2.79 MIL/uL — ABNORMAL LOW (ref 3.87–5.11)
RDW: 14.2 % (ref 11.5–15.5)
WBC: 9.1 10*3/uL (ref 4.0–10.5)
nRBC: 0 % (ref 0.0–0.2)

## 2020-12-25 LAB — MAGNESIUM: Magnesium: 2.1 mg/dL (ref 1.7–2.4)

## 2020-12-25 LAB — HEPARIN LEVEL (UNFRACTIONATED)
Heparin Unfractionated: <0.1 IU/mL — ABNORMAL LOW (ref 0.30–0.70)
Heparin Unfractionated: 0.15 IU/mL — ABNORMAL LOW (ref 0.30–0.70)

## 2020-12-25 LAB — PHOSPHORUS: Phosphorus: 2.3 mg/dL — ABNORMAL LOW (ref 2.5–4.6)

## 2020-12-25 MED ORDER — HEPARIN BOLUS VIA INFUSION
1500.0000 [IU] | Freq: Once | INTRAVENOUS | Status: DC
  Filled 2020-12-25: qty 1500

## 2020-12-25 MED ORDER — LACTATED RINGERS IV BOLUS
1000.0000 mL | Freq: Once | INTRAVENOUS | Status: AC
  Administered 2020-12-25: 1000 mL via INTRAVENOUS

## 2020-12-25 MED ORDER — POVIDONE-IODINE 10 % EX SWAB: 2.0000 "application " | Freq: Once | CUTANEOUS | Status: DC

## 2020-12-25 MED ORDER — HEPARIN BOLUS VIA INFUSION
1500.0000 [IU] | Freq: Once | INTRAVENOUS | Status: AC
  Administered 2020-12-25: 1500 [IU] via INTRAVENOUS
  Filled 2020-12-25: qty 1500

## 2020-12-25 MED ORDER — CHLORHEXIDINE GLUCONATE 4 % EX LIQD: 60.0000 mL | Freq: Once | CUTANEOUS | Status: DC

## 2020-12-25 NOTE — Progress Notes (Signed)
NAME:  [name], MRN:  [id number], DOB:  1946-09-14, LOS: 3 ADMISSION DATE:  12/22/2020, CONSULTATION DATE:  12/23/2020 REFERRING MD:  Hezzie Bump, CHIEF COMPLAINT:  Altered Mental Status, Pain to 3rd toe of Right Foot   Brief History:  75 y.o. Female admitted with Severe PVD w/ Gangrene to the Right 3rd toe.  Vascular Surgery and Podiatry consulted. On 12/23/20 she underwent Angiogram to the RLE with mechanical thrombectomy and catheter directed thrombolytic therapy to the Right SFA, popliteal artery, and anterior tibial artery.  She is being transferred to ICU as she requires overnight thrombolytic therapy via infusion catheter.  History of Present Illness:  And [name] is a 75 year old female with a past medical history most notable for severe peripheral arterial disease status post recent (12/02/2020) mechanical thrombectomy and stent placement to the  right distal SFA and popliteal artery and recent dry gangrene of the right third toe who presented to [facility] ED on 12/22/2020 due to complaints of altered mental status and worsening pain in her right third toe.  She reported increased redness, pain, and swelling of her right third toe over the past week.  She rated her pain 10 out of 10, with progression of redness extending up to her right ankle with associated chills.  She denied fever, chest pain, shortness of breath, nausea, vomiting, diarrhea, palpitations, dizziness, abdominal pain, dysuria, or trauma to the area.  ER course: Upon presentation to the ED she was noted to be tachycardic and tachypneic, otherwise hemodynamically stable.  Labs showed sodium 141, potassium 3.0, chloride 105, bicarb 24, glucose 110, BUN 15, creatinine 0.8, calcium 8.6, alkaline phosphatase 76, albumin 3.2, AST 15, ALT 10, lactic acid 1.2, WBC 10.0, hemoglobin 11.0, hematocrit 34.7, platelets 557, ESR 47.  Her COVID-19 PCR is negative.  X-ray of the right foot showed erosive changes of the third PIP compatible with septic  arthritis and osteomyelitis.  She was given empiric IV antibiotics.  Hospitalist was asked to admit the patient for further work-up and treatment of osteomyelitis of the right third toe.  Vascular surgery and podiatry were consulted.  ABI of the lower extremities was obtained and revealed severe right lower extremity peripheral artery disease with right ABI of 0.36.  The left ankle-brachial index was normal.  -12/23/2020 she underwent Angiogram to the RLE with mechanical thrombectomy and catheter directed thrombolytic therapy to the Right SFA, popliteal artery, and anterior tibial artery.  She is being transferred to ICU as she requires overnight thrombolytic therapy via infusion catheter.  PCCM is consulted as there is concern she may require Precedex infusion due to restlessness and need for bedrest/immobilization of the affected extremity.  2/04/-  Plan to transition from precedex to PO anxiolytic with medical optimization for transfer to med-surg.   12/25/2020- patient has improved clinically, her mood is stable and pleasant. Patient still on precedex and levophed.  Today plan is to wean both in preparation for surgery in am.     Past Medical History:  COPD CAD Peripheral Artery Disease Dyslipidemia GERD Hypothyroidism Lumbar spinal stenosis Anxiety Depression  Significant Hospital Events:  2/2: Presented to ED, to be admitted by Hospitalist; Vascular Surgery and Podiatry consulted 2/3: Underwent Angiogram RLE, transfer to ICU post procedure for overnight thrombolytic therapy; PCCM consulted for possible Precedex  Consults:  Hospitalist (primary service) Vascular surgery Podiatry PCCM  Procedures:  2/3: Angiogram right lower extremity 2/3: Left femoral CVC placed  Significant Diagnostic Tests:  2/2: X-ray Right foot>>Erosive changes of the third  PIP compatible with septic arthritis and osteomyelitis. 2/2: ABI Lower Extremities>>1. Severe right lower extremity peripheral  artery disease with right ABI = 0.36. 2. Normal left ankle brachial index.  Micro Data:  2/2: SARS-CoV-2 PCR>> negative 2/2: HIV screen>> nonreactive 2/2: Blood culture x2>> 2/2:MRSA PCR>> negative  Antimicrobials:  Cefepime x1 dose 2/2 Ceftriaxone 2/2>> Flagyl 2/2>> Vancomycin 3/2>>    Objective   Blood pressure (!) 135/53, pulse 85, temperature (!) 97.3 F (36.3 C), temperature source Oral, resp. rate 20, height 5' 4"  (1.626 m), weight 51.3 kg, SpO2 99 %.        Intake/Output Summary (Last 24 hours) at 12/25/2020 1231 Last data filed at 12/25/2020 8768 Gross per 24 hour  Intake 2017.73 ml  Output 400 ml  Net 1617.73 ml   Filed Weights   12/22/20 0810 12/23/20 0907 12/23/20 1232  Weight: 59 kg 59 kg 51.3 kg    Examination: General: Acutely ill-appearing female, laying in the bed, on 4 L nasal cannula, no acute distress HENT: Atraumatic, normocephalic, neck supple, no JVD Lungs: Coarse breath sounds to bilateral lower lobes, otherwise clear, even, nonlabored Cardiovascular: Regular rate and rhythm, S1-S2, no murmurs, rubs, gallops Abdomen: Soft, nontender, nondistended, no guarding or rebound tenderness, bowel sounds positive x4 Extremities: Left foot without ulceration or gangrenous changes and diminished dorsalis pedis and posterior tibial pulses; right foot with gangrenous right third toe, absent dorsalis pedis and posterior tibial pulses Neuro: Sleeping soundly status post fentanyl Versed administration, pupils PERRLA Skin: Gangrene to the right third toe      Resolved Hospital Problem list   N/A  Assessment & Plan:    Gangrene of the Right Third Toe Severe Peripheral Vascular Disease -Monitor fever curve -Trend WBC's -Follow cultures as above -Continue Rocephin, Flagyl, and Vancomycin for now pending cultures and sensitivities -Vascular Surgery and Podiatry following, appreciate input -S/p Angiogram of RLE on 12/23/20 with Mechanical thrombectomy and  catheter directed thrombolytic therapy to the Right SFA, popliteal artery, and anterior tibial artery -To undergo Thrombolytic therapy overnight in ICU with repeat Angiogram tomorrow 12/24/20 -Pain control -Precedex if needed to ensure pt relaxed and able to tolerate bedrest -Tentative plan for Amputation as some point as per Podiatry   History of COPD, without Acute Exacerbation -Supplemental O2 as needed to maintain O2 sats 88 to 94% -Follow intermittent CXR & ABG as needed -PRN Bronchodilators -Continue Dulera and Singulair -Encourage smoking cessation   Best practice (evaluated daily)  Diet: NPO Pain/Anxiety/Delirium protocol (if indicated): Dilaudid, Precedex if needed VAP protocol (if indicated): N/A DVT prophylaxis: Heparin gtt GI prophylaxis: N/A Glucose control: N/A Mobility: Bedrest Disposition:ICU  Goals of Care:  Last date of multidisciplinary goals of care discussion:N/A Family and staff present: Updated pt at bedside 12/23/20 Summary of discussion: Overnight infusion of tPA with return for Angiogram tomorrow Follow up goals of care discussion due: 12/24/20 Code Status: Full Code  Labs   CBC: Recent Labs  Lab 12/22/20 0913 12/23/20 0510 12/24/20 0455 12/24/20 1059 12/24/20 1557 12/24/20 2245 12/25/20 0603  WBC 10.0   < > 7.9 10.3 9.7 9.8 9.1  NEUTROABS 7.8*  --  5.9  --   --   --   --   HGB 11.0*   < > 8.9* 8.9* 8.8* 7.9* 7.8*  HCT 34.7*   < > 27.9* 28.8* 27.5* 25.5* 25.1*  MCV 87.8   < > 90.0 90.0 90.8 91.4 90.0  PLT 557*   < > 321 347 348 315 335   < > =  values in this interval not displayed.    Basic Metabolic Panel: Recent Labs  Lab 12/22/20 0913 12/23/20 0510 12/24/20 0441 12/25/20 0603  NA 141 141 137 139  K 3.0* 4.2 3.6 3.4*  CL 105 112* 108 110  CO2 24 22 19* 21*  GLUCOSE 110* 63* 89 90  BUN 15 9 5* <5*  CREATININE 0.80 0.50 0.46 0.58  CALCIUM 8.6* 7.9* 7.6* 7.5*  MG  --   --  1.4* 2.1  PHOS  --   --  2.5 2.3*   GFR: Estimated  Creatinine Clearance: 50.7 mL/min (by C-G formula based on SCr of 0.58 mg/dL). Recent Labs  Lab 12/22/20 0913 12/23/20 0510 12/24/20 1059 12/24/20 1557 12/24/20 2245 12/25/20 0603  PROCALCITON  --   --   --   --   --  1.26  WBC 10.0   < > 10.3 9.7 9.8 9.1  LATICACIDVEN 1.2  --   --   --   --   --    < > = values in this interval not displayed.    Liver Function Tests: Recent Labs  Lab 12/22/20 0913  AST 15  ALT 10  ALKPHOS 76  BILITOT 0.4  PROT 6.9  ALBUMIN 3.2*   No results for input(s): LIPASE, AMYLASE in the last 168 hours. No results for input(s): AMMONIA in the last 168 hours.  ABG    Component Value Date/Time   TCO2 26 12/07/2011 1042     Coagulation Profile: No results for input(s): INR, PROTIME in the last 168 hours.  Cardiac Enzymes: No results for input(s): CKTOTAL, CKMB, CKMBINDEX, TROPONINI in the last 168 hours.  HbA1C: No results found for: HGBA1C  CBG: Recent Labs  Lab 12/24/20 1923 12/25/20 0004 12/25/20 0303 12/25/20 0711 12/25/20 1114  GLUCAP 98 82 81 88 93    Review of Systems:   Unable to assess due to lethargic following fentanyl and versed administration   Past Medical History:  She,  has a past medical history of Anxiety, CAD (coronary artery disease), Carpal tunnel syndrome, Cataract, Complication of anesthesia, COPD (chronic obstructive pulmonary disease) ([place]), CRP elevated (09/14/2015), Depression, Difficult intubation, Dyslipidemia, Dyspnea, Dysrhythmia, Elevated sedimentation rate (09/14/2015), Esophageal spasm, Gastrointestinal parasites, GERD (gastroesophageal reflux disease), Hiatal hernia, History of peptic ulcer disease, Hyperthyroidism, Hypothyroidism, Low magnesium levels (09/14/2015), Pelvic fracture ([place]) (2008), Reflux, Rotator cuff injury, and Stenosis, spinal, lumbar.   Surgical History:   Past Surgical History:  Procedure Laterality Date  . APPENDECTOMY    . BACK SURGERY     CERVICAL FUSION  . CARPAL TUNNEL  RELEASE    . CATARACT EXTRACTION W/PHACO Right 08/07/2017   Procedure: CATARACT EXTRACTION PHACO AND INTRAOCULAR LENS PLACEMENT (IOC);  Surgeon: Birder Robson, MD;  Location: [facility] ORS;  Service: Ophthalmology;  Laterality: Right;  [place] 00:52.0 AP% 16.8 CDE 8.74 Fluid Pack Lot # [id number]  . CATARACT EXTRACTION W/PHACO Left 09/04/2017   Procedure: CATARACT EXTRACTION PHACO AND INTRAOCULAR LENS PLACEMENT (IOC);  Surgeon: Birder Robson, MD;  Location: [facility];  Service: Ophthalmology;  Laterality: Left;  [place] 00:34 AP% 17.0 CDE 5.80 Fluid pack lot # [id number]  . CESAREAN SECTION    . CHOLECYSTECTOMY    . FRACTURE SURGERY    . HIP SURGERY    . INTRAMEDULLARY (IM) NAIL INTERTROCHANTERIC Right 10/01/2016   Procedure: INTRAMEDULLARY (IM) NAIL INTERTROCHANTRIC;  Surgeon: Corky Mull, MD;  Location: [facility];  Service: Orthopedics;  Laterality: Right;  . KYPHOPLASTY N/A 08/20/2018   Procedure: KYPHOPLASTY-L3;  Surgeon: Hessie Knows, MD;  Location: [facility];  Service: Orthopedics;  Laterality: N/A;  . LOWER EXTREMITY ANGIOGRAPHY Right 12/18/2019   Procedure: LOWER EXTREMITY ANGIOGRAPHY;  Surgeon: Algernon Huxley, MD;  Location: [place] [facility];  Service: Cardiovascular;  Laterality: Right;  . LOWER EXTREMITY ANGIOGRAPHY Right 12/02/2020   Procedure: LOWER EXTREMITY ANGIOGRAPHY;  Surgeon: Algernon Huxley, MD;  Location: [place] [facility];  Service: Cardiovascular;  Laterality: Right;  . LOWER EXTREMITY ANGIOGRAPHY Right 12/23/2020   Procedure: Lower Extremity Angiography;  Surgeon: Algernon Huxley, MD;  Location: [place] [facility];  Service: Cardiovascular;  Laterality: Right;  . LOWER EXTREMITY ANGIOGRAPHY Right 12/24/2020   Procedure: Lower Extremity Angiography;  Surgeon: Algernon Huxley, MD;  Location: [place] [facility];  Service: Cardiovascular;  Laterality: Right;  . NECK SURGERY    . NOSE SURGERY    . ROTATOR CUFF REPAIR     x2  . SHOULDER ARTHROSCOPY  12/07/2011   Procedure: ARTHROSCOPY  SHOULDER;  Surgeon: Ninetta Lights, MD;  Location: [place];  Service: Orthopedics;  Laterality: Right;  Debridement Partial Cuff Tear, Release Coracoacromial Ligament  . SHOULDER SURGERY  12/07/2011   right     Social History:   reports that she has been smoking cigarettes. She has been smoking about 0.00 packs per day. She has never used smokeless tobacco. She reports that she does not drink alcohol and does not use drugs.   Family History:  Her family history includes Aneurysm in an other family member.   Allergies No Known Allergies   Home Medications  Prior to Admission medications   Medication Sig Start Date End Date Taking? Authorizing Provider  ALPRAZolam Duanne Moron) 0.5 MG tablet Take 0.5 mg by mouth every 6 (six) hours as needed for anxiety.   Yes [provider]  HYDROcodone-acetaminophen (NORCO/VICODIN) 5-325 MG tablet Take 1 tablet by mouth every 6 (six) hours as needed for moderate pain. 12/03/20   Kris Hartmann, NP  ALPRAZolam Duanne Moron) 1 MG tablet Take 1 tablet (1 mg total) by mouth 4 (four) times daily as needed. Patient taking differently: Take 1 mg by mouth 2 (two) times daily as needed. 10/04/16   Epifanio Lesches, MD  amLODipine (NORVASC) 5 MG tablet Take 1 tablet (5 mg total) by mouth daily. 08/22/18   Gladstone Lighter, MD  aspirin EC 81 MG tablet Take 1 tablet (81 mg total) by mouth daily. 12/02/20   Algernon Huxley, MD  atorvastatin (LIPITOR) 10 MG tablet Take 1 tablet (10 mg total) by mouth daily. 12/18/19 12/17/20  Algernon Huxley, MD  buPROPion (WELLBUTRIN SR) 150 MG 12 hr tablet Take 150 mg by mouth daily.     [provider]  clopidogrel (PLAVIX) 75 MG tablet Take 1 tablet (75 mg total) by mouth daily. 12/18/19   Algernon Huxley, MD  escitalopram (LEXAPRO) 10 MG tablet Take 10 mg by mouth daily.  10/15/13   [provider]  Fluticasone-Salmeterol (ADVAIR DISKUS) 250-50 MCG/DOSE AEPB Inhale 1 puff into the lungs 2 (two) times  daily. 10/24/18 03/16/20  Dustin Flock, MD  levothyroxine (SYNTHROID, LEVOTHROID) 88 MCG tablet Take 88 mcg by mouth daily before breakfast.     [provider]  montelukast (SINGULAIR) 10 MG tablet Take 10 mg by mouth daily.    [provider]  oxyCODONE (OXY IR/ROXICODONE) 5 MG immediate release tablet Take 1 tablet (5 mg total) by mouth 2 (two) times daily as needed for severe  pain. Must last 30 days Patient not taking: Reported on 12/02/2020 11/11/20 12/11/20  Milinda Pointer, MD  oxyCODONE (OXY IR/ROXICODONE) 5 MG immediate release tablet Take 1 tablet (5 mg total) by mouth 2 (two) times daily as needed for severe pain. Must last 30 days 12/11/20 01/10/21  Milinda Pointer, MD  oxyCODONE (OXY IR/ROXICODONE) 5 MG immediate release tablet Take 1 tablet (5 mg total) by mouth 2 (two) times daily as needed for severe pain. Must last 30 days 01/10/21 02/09/21  Milinda Pointer, MD  tizanidine (ZANAFLEX) 2 MG capsule Take 1 capsule (2 mg total) by mouth 3 (three) times daily as needed for muscle spasms. Patient not taking: No sig reported 11/03/20 02/01/21  Milinda Pointer, MD  traZODone (DESYREL) 100 MG tablet Take 1 tablet (100 mg total) by mouth at bedtime. Patient taking differently: Take 200 mg by mouth at bedtime. 08/21/18   Gladstone Lighter, MD     Critical care time: 33 minutes    Critical care provider statement:    Critical care time (minutes):  33   Critical care time was exclusive of:  Separately billable procedures and  treating other patients   Critical care was necessary to treat or prevent imminent or  life-threatening deterioration of the following conditions:     Critical care was time spent personally by me on the following  activities:  Development of treatment plan with patient or surrogate,  discussions with consultants, evaluation of patient's response to  treatment, examination of patient, obtaining history from patient or  surrogate, ordering  and performing treatments and interventions, ordering  and review of laboratory studies and re-evaluation of patient's condition   I assumed direction of critical care for this patient from another  provider in my specialty: no     Ottie Glazier, M.D.  [facility] [place]

## 2020-12-25 NOTE — Progress Notes (Signed)
Daily Progress Note   Subjective  - 1 Day Post-Op  Follow-up right third toe gangrene.  Patient in ICU.  On pressors mainly for agitation post procedure.  Much more alert and awake today.  I have discussed case with Dr.Aleskerov in ICU.  Objective Vitals:   12/25/20 1000 12/25/20 1015 12/25/20 1030 12/25/20 1045  BP: (!) 145/52 119/61 (!) 81/62 (!) 135/53  Pulse: 91 88 95 85  Resp: (!) 25 15 16 20   Temp:      TempSrc:      SpO2: 96% 94% 97% 99%  Weight:      Height:        Physical Exam: Gangrene to the right third toe is noted.  Still some erythema proximal to the third toe.  Patient still complains of severe pain into the right lower extremity.  I am concerned there is a little bit of worsening discoloration of the fourth toe as well.  We will have to closely monitor.  Laboratory CBC    Component Value Date/Time   WBC 9.1 12/25/2020 0603   HGB 7.8 (L) 12/25/2020 0603   HCT 25.1 (L) 12/25/2020 0603   PLT 335 12/25/2020 0603    BMET    Component Value Date/Time   NA 139 12/25/2020 0603   NA 143 10/06/2019 1552   NA 138 02/10/2014 1335   K 3.4 (L) 12/25/2020 0603   K 4.5 02/10/2014 1335   CL 110 12/25/2020 0603   CL 106 02/10/2014 1335   CO2 21 (L) 12/25/2020 0603   CO2 28 02/10/2014 1335   GLUCOSE 90 12/25/2020 0603   GLUCOSE 99 02/10/2014 1335   BUN <5 (L) 12/25/2020 0603   BUN 7 (L) 10/06/2019 1552   BUN 6 (L) 02/10/2014 1335   CREATININE 0.58 12/25/2020 0603   CREATININE 0.89 02/10/2014 1335   CALCIUM 7.5 (L) 12/25/2020 0603   CALCIUM 8.7 02/10/2014 1335   GFRNONAA >60 12/25/2020 0603   GFRNONAA >60 02/10/2014 1335   GFRAA >60 12/18/2019 1012   GFRAA >60 02/10/2014 1335    Assessment/Planning: Gangrene right foot with peripheral vascular disease   Patient status post revascularization.  Has a palpable DP pulse today to the foot.  We will plan for amputation of the third toe but I did discuss with the patient we may need to perform an amputation of  the fourth toe if this progressively gets worse.  I also discussed with her daughter-in-law in regards to this who understands the possible need for further intervention and plan at this point.  We will hold heparin tonight.  Can restart tomorrow.  The risk benefits alternatives and complications associated with the surgery including but not limited to continued wound complications, nonhealing and amputation were discussed with the patient full.  Samara Deist A  12/25/2020, 12:58 PM

## 2020-12-25 NOTE — Progress Notes (Signed)
PT Cancellation Note  Patient Details Name: [name] MRN: [id number] DOB: Mar 31, 1946   Cancelled Treatment:    Reason Eval/Treat Not Completed: Patient not medically ready PT orders received, chart reviewed. Pt on Levophed for BP management (initated 12/24/20 at 2340) and per therapy protocols, PT to hold for first 24 hrs after medication initiation. Will f/u as pt is medically appropriate.  Lavone Nian, PT, DPT 12/25/20, 8:47 AM    [name] 12/25/2020, 8:42 AM

## 2020-12-25 NOTE — Progress Notes (Signed)
ANTICOAGULATION CONSULT NOTE  Pharmacy Consult for heparin Indication: s/p mechanical thrombectomy and catheter directed thrombolytic therapy  Patient Measurements: Heparin Dosing Weight: 51 kg  Labs: Recent Labs    12/23/20 0510 12/23/20 1038 12/23/20 1627 12/23/20 2233 12/24/20 0441 12/24/20 0455 12/24/20 1557 12/24/20 2245 12/25/20 0603  HGB 10.5*   < > 9.6* 9.4*  --    < > 8.8* 7.9* 7.8*  HCT 33.1*   < > 31.6* 30.3*  --    < > 27.5* 25.5* 25.1*  PLT 385   < > 358 362  --    < > 348 315 335  HEPARINUNFRC  --    < > 0.20* 0.12* <0.10*  --   --   --   --   CREATININE 0.50  --   --   --  0.46  --   --   --  0.58   < > = values in this interval not displayed.    Estimated Creatinine Clearance: 50.7 mL/min (by C-G formula based on SCr of 0.58 mg/dL).   Medical History: Past Medical History:  Diagnosis Date  . Anxiety   . CAD (coronary artery disease)   . Carpal tunnel syndrome   . Cataract   . Complication of anesthesia    has woken up at times  . COPD (chronic obstructive pulmonary disease) ([place])   . CRP elevated 09/14/2015  . Depression   . Difficult intubation   . Dyslipidemia   . Dyspnea    DOE  . Dysrhythmia    hx palpatations  . Elevated sedimentation rate 09/14/2015  . Esophageal spasm   . Gastrointestinal parasites   . GERD (gastroesophageal reflux disease)   . Hiatal hernia   . History of peptic ulcer disease   . Hyperthyroidism   . Hypothyroidism   . Low magnesium levels 09/14/2015  . Pelvic fracture ([place]) 2008   fall from riding a horse  . Reflux   . Rotator cuff injury   . Stenosis, spinal, lumbar     Assessment: 75 year old female with known h/o atherosclerotic disease with gangrenous changes to her right foot. Patient is s/p mechanical thrombectomy and catheter directed thrombolytic therapy. Patient started on heparin drip and then transitioned to Aggrastat post-procedure. Once aggrastat was completed heparin infusion restarted.  Pharmacy  consult for transition back to heparin drip.  2/4 1326 HL < 0.1 - called lab @1437  on 2/5 to get result.   Goal of Therapy:  Heparin level 0.3-0.7 units/ml Monitor platelets by anticoagulation protocol: Yes   Plan:  Heparin level is subtherapeutic. Will give a heparin bolus of 1500 units x 1 followed by increase heparin infusion to 850 units/hr. Recheck heparin level in 8 hours. CBC daily while on heparin.    Oswald Hillock, PharmD 12/25/2020,2:36 PM

## 2020-12-25 NOTE — Progress Notes (Signed)
OT Cancellation Note  Patient Details Name: [name] MRN: [id number] DOB: 28-Dec-1945   Cancelled Treatment:    Reason Eval/Treat Not Completed: Medical issues which prohibited therapy. OT order received and chart reviewed.Pt on Levophed for BP management (initated 12/24/20 at 2340) and per therapy protocols, OT to hold for first 24 hrs after medication initiation. Will f/u as pt is medically appropriate and able to actively participate.   Darleen Crocker, [place], OTR/L , CBIS ascom [phone]  12/25/20, 9:37 AM   12/25/2020, 9:36 AM

## 2020-12-25 NOTE — Anesthesia Preprocedure Evaluation (Addendum)
Anesthesia Evaluation  Patient identified by MRN, date of birth, ID band Patient awake  General Assessment Comment:Patient with toe osteomyelitis, presented for AMS. Mental status better now, on precedex for agitation. Patient AOx3 and converses appropriately with me. Noted hx of difficult intubation listed in chart, but no anesthetic record could be found with difficult intubation. In 2013 she had a Grade II view with Miller 2, and in 2017 a Grade II view with a Glidescope blade.  Reviewed: Allergy & Precautions, NPO status , Patient's Chart, lab work & pertinent test results  History of Anesthesia Complications (+) DIFFICULT AIRWAY and history of anesthetic complications  Airway Mallampati: III  TM Distance: >3 FB Neck ROM: Limited    Dental  (+) Poor Dentition, Missing, Chipped   Pulmonary shortness of breath and with exertion, neg sleep apnea, neg pneumonia , COPD,  COPD inhaler, Current Smoker and Patient abstained from smoking.,  Patient not O2-dependent at home but said she probably should be    + decreased breath sounds      Cardiovascular Exercise Tolerance: Poor METS(-) hypertension+ CAD and + Peripheral Vascular Disease  (-) Past MI (-) dysrhythmias  Rhythm:Regular Rate:Tachycardia - Systolic murmurs    Neuro/Psych PSYCHIATRIC DISORDERS Anxiety Depression S/p C-spine surgeries  Neuromuscular disease    GI/Hepatic hiatal hernia, GERD  Medicated,(+)     (-) substance abuse  ,   Endo/Other  neg diabetesHypothyroidism Hyperthyroidism   Renal/GU negative Renal ROS     Musculoskeletal   Abdominal   Peds  Hematology   Anesthesia Other Findings Past Medical History: No date: Anxiety No date: CAD (coronary artery disease) No date: Carpal tunnel syndrome No date: Cataract No date: Complication of anesthesia     Comment:  has woken up at times No date: COPD (chronic obstructive pulmonary disease)  ([place]) 09/14/2015: CRP elevated No date: Depression No date: Difficult intubation No date: Dyslipidemia No date: Dyspnea     Comment:  DOE No date: Dysrhythmia     Comment:  hx palpatations 09/14/2015: Elevated sedimentation rate No date: Esophageal spasm No date: Gastrointestinal parasites No date: GERD (gastroesophageal reflux disease) No date: Hiatal hernia No date: History of peptic ulcer disease No date: Hyperthyroidism No date: Hypothyroidism 09/14/2015: Low magnesium levels 2008: Pelvic fracture ([place])     Comment:  fall from riding a horse No date: Reflux No date: Rotator cuff injury No date: Stenosis, spinal, lumbar  Reproductive/Obstetrics                           Anesthesia Physical Anesthesia Plan  ASA: III  Anesthesia Plan: General   Post-op Pain Management:    Induction: Intravenous  PONV Risk Score and Plan: 2 and Ondansetron, Propofol infusion, TIVA and Dexamethasone  Airway Management Planned: Natural Airway  Additional Equipment: None  Intra-op Plan:   Post-operative Plan:   Informed Consent: I have reviewed the patients History and Physical, chart, labs and discussed the procedure including the risks, benefits and alternatives for the proposed anesthesia with the patient or authorized representative who has indicated his/her understanding and acceptance.     Dental advisory given  Plan Discussed with: CRNA and Surgeon  Anesthesia Plan Comments: (Patient consented for risks of anesthesia including but not limited to:  - adverse reactions to medications - risk of airway placement if required - damage to eyes, teeth, lips or other oral mucosa - nerve damage due to positioning  - sore throat or hoarseness - Damage  to heart, brain, nerves, lungs, other parts of body or loss of life  Patient voiced understanding.)       Anesthesia Quick Evaluation

## 2020-12-25 NOTE — Progress Notes (Signed)
Patient was in agonizing pain in the right foot that kept her awake most of the night.   Precedex is maintained to help with immobility and anxiety. Patient did received levo to maintain the b/p. B/P: is now 99/62 MAP 74.Levo is on hold at this time. RN oxy and morphine administered several times. HOB kept elevated. New orders on file for midodrine to start at 0800. Hep gtt initiated at 6.70ml/hr. Patient continues on 3L O2 via [place]. CHG bath completed. Call light kept within reach, water at bedside. WIll continue to monitor and endorse.

## 2020-12-25 NOTE — Progress Notes (Signed)
Subjective  - POD #1, status post right lower extremity mechanical thrombectomy of the right superficial femoral artery and angioplasty of the right anterior tibial artery  Currently without complaints   Physical Exam:  Groin cannulation site is soft. Palpable right dorsalis pedis pulse Gangrenous changes to right third and second toe       Assessment/Plan:  POD #1  The patient's vascular intervention remains widely patent.  She is scheduled for toe amputation tomorrow.  She can be transitioned to Eliquis after her procedure.  Currently she is on IV heparin  [place]s [name] 12/25/2020 3:47 PM --  Vitals:   12/25/20 1430 12/25/20 1445  BP: 135/62 (!) 118/53  Pulse:  92  Resp: (!) 22 (!) 21  Temp:    SpO2:  100%    Intake/Output Summary (Last 24 hours) at 12/25/2020 1547 Last data filed at 12/25/2020 0511 Gross per 24 hour  Intake 2017.73 ml  Output 400 ml  Net 1617.73 ml     Laboratory CBC    Component Value Date/Time   WBC 9.1 12/25/2020 0603   HGB 7.8 (L) 12/25/2020 0603   HCT 25.1 (L) 12/25/2020 0603   PLT 335 12/25/2020 0603    BMET    Component Value Date/Time   NA 139 12/25/2020 0603   NA 143 10/06/2019 1552   NA 138 02/10/2014 1335   K 3.4 (L) 12/25/2020 0603   K 4.5 02/10/2014 1335   CL 110 12/25/2020 0603   CL 106 02/10/2014 1335   CO2 21 (L) 12/25/2020 0603   CO2 28 02/10/2014 1335   GLUCOSE 90 12/25/2020 0603   GLUCOSE 99 02/10/2014 1335   BUN <5 (L) 12/25/2020 0603   BUN 7 (L) 10/06/2019 1552   BUN 6 (L) 02/10/2014 1335   CREATININE 0.58 12/25/2020 0603   CREATININE 0.89 02/10/2014 1335   CALCIUM 7.5 (L) 12/25/2020 0603   CALCIUM 8.7 02/10/2014 1335   GFRNONAA >60 12/25/2020 0603   GFRNONAA >60 02/10/2014 1335   GFRAA >60 12/18/2019 1012   GFRAA >60 02/10/2014 1335    COAG Lab Results  Component Value Date   INR 1.08 10/01/2016   No results found for: PTT  Antibiotics Anti-infectives (From admission, onward)   Start      Dose/Rate Route Frequency Ordered Stop   12/24/20 0830  ceFAZolin (ANCEF) IVPB 2g/100 mL premix       Note to Pharmacy: To be given in specials   2 g 200 mL/hr over 30 Minutes Intravenous  Once 12/24/20 0818 12/24/20 0855   12/24/20 0600  ceFAZolin (ANCEF) IVPB 1 g/50 mL premix        1 g 100 mL/hr over 30 Minutes Intravenous Every 8 hours 12/23/20 2244     12/23/20 1000  vancomycin (VANCOCIN) IVPB 1000 mg/200 mL premix  Status:  Discontinued        1,000 mg 200 mL/hr over 60 Minutes Intravenous Every 24 hours 12/22/20 1204 12/23/20 2244   12/23/20 0945  ceFAZolin (ANCEF) IVPB 2g/100 mL premix       Note to Pharmacy: To be given in specials   2 g 200 mL/hr over 30 Minutes Intravenous  Once 12/23/20 0854 12/24/20 0750   12/22/20 1800  cefTRIAXone (ROCEPHIN) 2 g in sodium chloride 0.9 % 100 mL IVPB  Status:  Discontinued        2 g 200 mL/hr over 30 Minutes Intravenous Every 24 hours 12/22/20 1127 12/23/20 2244   12/22/20 1130  metroNIDAZOLE (FLAGYL)  IVPB 500 mg  Status:  Discontinued        500 mg 100 mL/hr over 60 Minutes Intravenous Every 8 hours 12/22/20 1127 12/23/20 2244   12/22/20 1115  ceFEPIme (MAXIPIME) 2 g in sodium chloride 0.9 % 100 mL IVPB        2 g 200 mL/hr over 30 Minutes Intravenous  Once 12/22/20 1100 12/22/20 1149   12/22/20 1115  vancomycin (VANCOCIN) IVPB 1000 mg/200 mL premix        1,000 mg 200 mL/hr over 60 Minutes Intravenous  Once 12/22/20 1100 12/22/20 1447       V. Leia Alf, M.D., [facility] [place] Office: [phone] Pager:  [phone]

## 2020-12-25 NOTE — Progress Notes (Signed)
ANTICOAGULATION CONSULT NOTE  Pharmacy Consult for heparin Indication: s/p mechanical thrombectomy and catheter directed thrombolytic therapy  Patient Measurements: Heparin Dosing Weight: 51 kg  Labs: Recent Labs    12/23/20 0510 12/23/20 1038 12/24/20 0441 12/24/20 0455 12/24/20 1557 12/24/20 2245 12/25/20 0603 12/25/20 1315 12/25/20 2310  HGB 10.5*   < >  --    < > 8.8* 7.9* 7.8*  --   --   HCT 33.1*   < >  --    < > 27.5* 25.5* 25.1*  --   --   PLT 385   < >  --    < > 348 315 335  --   --   HEPARINUNFRC  --    < > <0.10*  --   --   --   --  <0.10* 0.15*  CREATININE 0.50  --  0.46  --   --   --  0.58  --   --    < > = values in this interval not displayed.    Estimated Creatinine Clearance: 50.7 mL/min (by C-G formula based on SCr of 0.58 mg/dL).   Medical History: Past Medical History:  Diagnosis Date  . Anxiety   . CAD (coronary artery disease)   . Carpal tunnel syndrome   . Cataract   . Complication of anesthesia    has woken up at times  . COPD (chronic obstructive pulmonary disease) ([place])   . CRP elevated 09/14/2015  . Depression   . Difficult intubation   . Dyslipidemia   . Dyspnea    DOE  . Dysrhythmia    hx palpatations  . Elevated sedimentation rate 09/14/2015  . Esophageal spasm   . Gastrointestinal parasites   . GERD (gastroesophageal reflux disease)   . Hiatal hernia   . History of peptic ulcer disease   . Hyperthyroidism   . Hypothyroidism   . Low magnesium levels 09/14/2015  . Pelvic fracture ([place]) 2008   fall from riding a horse  . Reflux   . Rotator cuff injury   . Stenosis, spinal, lumbar     Assessment: 75 year old female with known h/o atherosclerotic disease with gangrenous changes to her right foot. Patient is s/p mechanical thrombectomy and catheter directed thrombolytic therapy. Patient started on heparin drip and then transitioned to Aggrastat post-procedure. Once aggrastat was completed heparin infusion restarted.  Pharmacy  consult for transition back to heparin drip.  2/4 1326 HL < 0.1 - called lab @1437  on 2/5 to get result.   Goal of Therapy:  Heparin level 0.3-0.7 units/ml Monitor platelets by anticoagulation protocol: Yes   Plan:  2/5:  HL @ 2310 = 0.15 Heparin gtt to be d/c'd at midnight due to surgery planned in AM.  If heparin is restarted after procedure should restart @ 1050 units/hr.     Suha Schoenbeck D, PharmD 12/25/2020,11:47 PM

## 2020-12-26 ENCOUNTER — Inpatient Hospital Stay: Payer: Medicare HMO | Admitting: Anesthesiology

## 2020-12-26 ENCOUNTER — Inpatient Hospital Stay: Payer: Medicare HMO

## 2020-12-26 ENCOUNTER — Encounter
Admission: EM | Disposition: A | Discharge status: Home Health | Payer: Self-pay | Source: Home / Self Care | Attending: Internal Medicine

## 2020-12-26 HISTORY — PX: AMPUTATION TOE: SHX6595

## 2020-12-26 LAB — CBC
HCT: 23 % — ABNORMAL LOW (ref 36.0–46.0)
Hemoglobin: 7.2 g/dL — ABNORMAL LOW (ref 12.0–15.0)
MCH: 28.1 pg (ref 26.0–34.0)
MCHC: 31.3 g/dL (ref 30.0–36.0)
MCV: 89.8 fL (ref 80.0–100.0)
Platelets: 324 10*3/uL (ref 150–400)
RBC: 2.56 MIL/uL — ABNORMAL LOW (ref 3.87–5.11)
RDW: 14.6 % (ref 11.5–15.5)
WBC: 8.2 10*3/uL (ref 4.0–10.5)
nRBC: 0 % (ref 0.0–0.2)

## 2020-12-26 LAB — BASIC METABOLIC PANEL
Anion gap: 6 (ref 5–15)
BUN: <5 mg/dL — ABNORMAL LOW (ref 8–23)
CO2: 25 mmol/L (ref 22–32)
Calcium: 7.7 mg/dL — ABNORMAL LOW (ref 8.9–10.3)
Chloride: 109 mmol/L (ref 98–111)
Creatinine, Ser: 0.64 mg/dL (ref 0.44–1.00)
GFR, Estimated: >60 mL/min (ref >60)
Glucose, Bld: 99 mg/dL (ref 70–99)
Potassium: 3.1 mmol/L — ABNORMAL LOW (ref 3.5–5.1)
Sodium: 140 mmol/L (ref 135–145)

## 2020-12-26 LAB — HEPARIN LEVEL (UNFRACTIONATED): Heparin Unfractionated: 0.52 IU/mL (ref 0.30–0.70)

## 2020-12-26 LAB — GLUCOSE, CAPILLARY
Glucose-Capillary: 90 mg/dL (ref 70–99)
Glucose-Capillary: 71 mg/dL (ref 70–99)
Glucose-Capillary: 76 mg/dL (ref 70–99)
Glucose-Capillary: 79 mg/dL (ref 70–99)
Glucose-Capillary: 75 mg/dL (ref 70–99)
Glucose-Capillary: 94 mg/dL (ref 70–99)

## 2020-12-26 LAB — PHOSPHORUS: Phosphorus: 1.9 mg/dL — ABNORMAL LOW (ref 2.5–4.6)

## 2020-12-26 LAB — PREPARE RBC (CROSSMATCH)

## 2020-12-26 LAB — MAGNESIUM: Magnesium: 1.5 mg/dL — ABNORMAL LOW (ref 1.7–2.4)

## 2020-12-26 LAB — PROCALCITONIN: Procalcitonin: 0.66 ng/mL

## 2020-12-26 SURGERY — AMPUTATION, TOE
Anesthesia: General | Site: Toe | Laterality: Right

## 2020-12-26 MED ORDER — LACTATED RINGERS IV SOLN
INTRAVENOUS | Status: DC | PRN
Start: 2020-12-26 — End: 2020-12-26

## 2020-12-26 MED ORDER — ETOMIDATE 2 MG/ML IV SOLN
INTRAVENOUS | Status: AC
  Filled 2020-12-26: qty 10

## 2020-12-26 MED ORDER — HEPARIN (PORCINE) 25000 UT/250ML-% IV SOLN
1050.0000 [IU]/h | INTRAVENOUS | Status: DC
  Administered 2020-12-26 – 2020-12-27 (×2): 1050 [IU]/h via INTRAVENOUS
  Filled 2020-12-26 (×2): qty 250

## 2020-12-26 MED ORDER — BUPIVACAINE LIPOSOME 1.3 % IJ SUSP
INTRAMUSCULAR | Status: DC | PRN
  Administered 2020-12-26: 5 mg

## 2020-12-26 MED ORDER — PHENYLEPHRINE HCL (PRESSORS) 10 MG/ML IV SOLN
INTRAVENOUS | Status: AC
  Filled 2020-12-26: qty 1

## 2020-12-26 MED ORDER — CLOPIDOGREL BISULFATE 75 MG PO TABS
75.0000 mg | ORAL_TABLET | Freq: Every day | ORAL | Status: DC
  Administered 2020-12-27: 75 mg via ORAL
  Filled 2020-12-26: qty 1

## 2020-12-26 MED ORDER — SODIUM CHLORIDE 0.9% IV SOLUTION
Freq: Once | INTRAVENOUS | Status: AC
  Administered 2020-12-26: 10 mL/h via INTRAVENOUS

## 2020-12-26 MED ORDER — ASPIRIN EC 81 MG PO TBEC
81.0000 mg | DELAYED_RELEASE_TABLET | Freq: Every day | ORAL | Status: DC
  Administered 2020-12-27 – 2021-01-01 (×6): 81 mg via ORAL
  Filled 2020-12-26 (×6): qty 1

## 2020-12-26 MED ORDER — FENTANYL CITRATE (PF) 100 MCG/2ML IJ SOLN
INTRAMUSCULAR | Status: AC
  Filled 2020-12-26: qty 2

## 2020-12-26 MED ORDER — FENTANYL CITRATE (PF) 100 MCG/2ML IJ SOLN
INTRAMUSCULAR | Status: DC | PRN
  Administered 2020-12-26 (×4): 25 ug via INTRAVENOUS

## 2020-12-26 MED ORDER — ONDANSETRON HCL 4 MG/2ML IJ SOLN
INTRAMUSCULAR | Status: DC | PRN
  Administered 2020-12-26: 4 mg via INTRAVENOUS

## 2020-12-26 MED ORDER — LACTATED RINGERS IV BOLUS
1000.0000 mL | Freq: Once | INTRAVENOUS | Status: AC
  Administered 2020-12-26: 1000 mL via INTRAVENOUS

## 2020-12-26 MED ORDER — PROPOFOL 10 MG/ML IV BOLUS
INTRAVENOUS | Status: AC
  Filled 2020-12-26: qty 20

## 2020-12-26 MED ORDER — ETOMIDATE 2 MG/ML IV SOLN
INTRAVENOUS | Status: DC | PRN
  Administered 2020-12-26: 2 mg via INTRAVENOUS
  Administered 2020-12-26: 6 mg via INTRAVENOUS

## 2020-12-26 MED ORDER — HEPARIN BOLUS VIA INFUSION
3100.0000 [IU] | Freq: Once | INTRAVENOUS | Status: AC
  Administered 2020-12-26: 3100 [IU] via INTRAVENOUS
  Filled 2020-12-26: qty 3100

## 2020-12-26 MED ORDER — PROPOFOL 500 MG/50ML IV EMUL
INTRAVENOUS | Status: AC
  Filled 2020-12-26: qty 50

## 2020-12-26 MED ORDER — BUPIVACAINE HCL 0.5 % IJ SOLN
INTRAMUSCULAR | Status: DC | PRN
  Administered 2020-12-26: 5 mL

## 2020-12-26 MED ORDER — PROPOFOL 500 MG/50ML IV EMUL
INTRAVENOUS | Status: DC | PRN
  Administered 2020-12-26: 40 ug/kg/min via INTRAVENOUS
  Administered 2020-12-26: 40 mg via INTRAVENOUS

## 2020-12-26 MED ORDER — POTASSIUM CHLORIDE 10 MEQ/100ML IV SOLN
10.0000 meq | INTRAVENOUS | Status: AC
  Administered 2020-12-26 (×4): 10 meq via INTRAVENOUS
  Filled 2020-12-26 (×4): qty 100

## 2020-12-26 SURGICAL SUPPLY — 49 items
BLADE OSC/SAGITTAL MD 5.5X18 (BLADE) ×2 IMPLANT
BLADE SURG MINI STRL (BLADE) ×2 IMPLANT
BNDG CMPR 75X41 PLY HI ABS (GAUZE/BANDAGES/DRESSINGS) ×1
BNDG CMPR STD VLCR NS LF 5.8X4 (GAUZE/BANDAGES/DRESSINGS) ×1
BNDG CONFORM 2 STRL LF (GAUZE/BANDAGES/DRESSINGS) ×2 IMPLANT
BNDG CONFORM 3 STRL LF (GAUZE/BANDAGES/DRESSINGS) ×4 IMPLANT
BNDG ELASTIC 4X5.8 VLCR NS LF (GAUZE/BANDAGES/DRESSINGS) ×2 IMPLANT
BNDG ELASTIC 4X5.8 VLCR STR LF (GAUZE/BANDAGES/DRESSINGS) ×1 IMPLANT
BNDG ESMARK 4X12 TAN STRL LF (GAUZE/BANDAGES/DRESSINGS) ×2 IMPLANT
BNDG GAUZE 4.5X4.1 6PLY STRL (MISCELLANEOUS) ×2 IMPLANT
BNDG STRETCH 4X75 STRL LF (GAUZE/BANDAGES/DRESSINGS) ×1 IMPLANT
CANISTER SUCT 1200ML W/VALVE (MISCELLANEOUS) ×2 IMPLANT
COVER WAND RF STERILE (DRAPES) ×2 IMPLANT
DRAPE FLUOR MINI C-ARM 54X84 (DRAPES) ×2 IMPLANT
DRAPE XRAY CASSETTE 23X24 (DRAPES) ×2 IMPLANT
DURAPREP 26ML APPLICATOR (WOUND CARE) ×2 IMPLANT
ELECT REM PT RETURN 9FT ADLT (ELECTROSURGICAL) ×2
ELECTRODE REM PT RTRN 9FT ADLT (ELECTROSURGICAL) ×1 IMPLANT
GAUZE PACKING IODOFORM 1/2 (PACKING) ×2 IMPLANT
GAUZE SPONGE 4X4 12PLY STRL (GAUZE/BANDAGES/DRESSINGS) ×2 IMPLANT
GAUZE XEROFORM 1X8 LF (GAUZE/BANDAGES/DRESSINGS) ×2 IMPLANT
GLOVE BIO SURGEON STRL SZ7.5 (GLOVE) ×2 IMPLANT
GLOVE INDICATOR 8.0 STRL GRN (GLOVE) ×2 IMPLANT
GOWN STRL REUS W/ TWL XL LVL3 (GOWN DISPOSABLE) ×2 IMPLANT
GOWN STRL REUS W/TWL XL LVL3 (GOWN DISPOSABLE) ×4
KIT TURNOVER KIT A (KITS) ×2 IMPLANT
LABEL OR SOLS (LABEL) ×2 IMPLANT
MANIFOLD NEPTUNE II (INSTRUMENTS) ×2 IMPLANT
NDL FILTER BLUNT 18X1 1/2 (NEEDLE) ×1 IMPLANT
NDL HYPO 25X1 1.5 SAFETY (NEEDLE) ×1 IMPLANT
NEEDLE FILTER BLUNT 18X 1/2SAF (NEEDLE) ×1
NEEDLE FILTER BLUNT 18X1 1/2 (NEEDLE) ×1 IMPLANT
NEEDLE HYPO 25X1 1.5 SAFETY (NEEDLE) ×2 IMPLANT
NS IRRIG 500ML POUR BTL (IV SOLUTION) ×2 IMPLANT
PACK EXTREMITY ARMC (MISCELLANEOUS) ×2 IMPLANT
PAD ABD DERMACEA PRESS 5X9 (GAUZE/BANDAGES/DRESSINGS) ×4 IMPLANT
PULSAVAC PLUS IRRIG FAN TIP (DISPOSABLE) ×2
SHIELD FULL FACE ANTIFOG 7M (MISCELLANEOUS) ×2 IMPLANT
SOL .9 NS 3000ML IRR  AL (IV SOLUTION) ×2
SOL .9 NS 3000ML IRR AL (IV SOLUTION) ×1
SOL .9 NS 3000ML IRR UROMATIC (IV SOLUTION) ×1 IMPLANT
STOCKINETTE M/LG 89821 (MISCELLANEOUS) ×2 IMPLANT
STRAP SAFETY 5IN WIDE (MISCELLANEOUS) ×2 IMPLANT
SUT ETHILON 3-0 FS-10 30 BLK (SUTURE) ×2
SUT ETHILON 5-0 FS-2 18 BLK (SUTURE) ×2 IMPLANT
SUT VIC AB 4-0 FS2 27 (SUTURE) ×2 IMPLANT
SUTURE EHLN 3-0 FS-10 30 BLK (SUTURE) ×1 IMPLANT
SYR 10ML LL (SYRINGE) ×6 IMPLANT
TIP FAN IRRIG PULSAVAC PLUS (DISPOSABLE) ×1 IMPLANT

## 2020-12-26 NOTE — Anesthesia Postprocedure Evaluation (Signed)
Anesthesia Post Note  Patient: [name]  Procedure(s) Performed: AMPUTATION TOE-3rd Toes (Right Toe)  Patient location during evaluation: PACU Anesthesia Type: General Level of consciousness: awake and alert Pain management: pain level controlled Vital Signs Assessment: post-procedure vital signs reviewed and stable Respiratory status: spontaneous breathing, nonlabored ventilation, respiratory function stable and patient connected to nasal cannula oxygen Cardiovascular status: blood pressure returned to baseline and stable Postop Assessment: no apparent nausea or vomiting Anesthetic complications: no   No complications documented.   Last Vitals:  Vitals:   12/26/20 0857 12/26/20 0909  BP: 101/78 126/90  Pulse: (!) 104 (!) 102  Resp: 15 16  Temp:  36.9 C  SpO2: 100% 100%    Last Pain:  Vitals:   12/26/20 0909  TempSrc:   PainSc: 0-No pain                 Precious Haws Garielle Mroz

## 2020-12-26 NOTE — Progress Notes (Signed)
[facility] for heparin Indication: s/p mechanical thrombectomy and catheter directed thrombolytic therapy  Patient Measurements: Heparin Dosing Weight: 51 kg  Labs: Recent Labs    12/24/20 0441 12/24/20 0455 12/24/20 2245 12/25/20 0603 12/25/20 1315 12/25/20 2310 12/26/20 0521  HGB  --    < > 7.9* 7.8*  --   --  7.2*  HCT  --    < > 25.5* 25.1*  --   --  23.0*  PLT  --    < > 315 335  --   --  324  HEPARINUNFRC <0.10*  --   --   --  <0.10* 0.15*  --   CREATININE 0.46  --   --  0.58  --   --  0.64   < > = values in this interval not displayed.    Estimated Creatinine Clearance: 50.7 mL/min (by C-G formula based on SCr of 0.64 mg/dL).   Medical History: Past Medical History:  Diagnosis Date  . Anxiety   . CAD (coronary artery disease)   . Carpal tunnel syndrome   . Cataract   . Complication of anesthesia    has woken up at times  . COPD (chronic obstructive pulmonary disease) ([place])   . CRP elevated 09/14/2015  . Depression   . Difficult intubation   . Dyslipidemia   . Dyspnea    DOE  . Dysrhythmia    hx palpatations  . Elevated sedimentation rate 09/14/2015  . Esophageal spasm   . Gastrointestinal parasites   . GERD (gastroesophageal reflux disease)   . Hiatal hernia   . History of peptic ulcer disease   . Hyperthyroidism   . Hypothyroidism   . Low magnesium levels 09/14/2015  . Pelvic fracture ([place]) 2008   fall from riding a horse  . Reflux   . Rotator cuff injury   . Stenosis, spinal, lumbar    Medications No anticoagulants prior to admission per chart review  Assessment: 76 year old female with known h/o atherosclerotic disease with gangrenous changes to her right foot. Patient is s/p mechanical thrombectomy and catheter directed thrombolytic therapy. Patient started on heparin drip and then transitioned to Aggrastat post-procedure. Once aggrastat was completed heparin infusion restarted. Heparin infusion then held  again for amputation of metatarsophalangeal joint third toe right foot. Pharmacy consult for transition back to heparin drip.  Hgb 7.2 (post-procedure), plts 324   Goal of Therapy:  Heparin level 0.3-0.7 units/ml Monitor platelets by anticoagulation protocol: Yes   Plan:  Give heparin bolus of 3100 units Restart heparin at pre-procedure dose of 1050 units/hr Check 8 hour HL Monitor CBC with AM labs  Benn Moulder, PharmD Pharmacy Resident  12/26/2020 11:11 AM

## 2020-12-26 NOTE — Transfer of Care (Signed)
Immediate Anesthesia Transfer of Care Note  Patient: [name]  Procedure(s) Performed: AMPUTATION TOE-3rd Toes (Right Toe)  Patient Location: PACU  Anesthesia Type:General  Level of Consciousness: sedated  Airway & Oxygen Therapy: Patient Spontanous Breathing and Patient connected to nasal cannula oxygen  Post-op Assessment: Report given to RN and Post -op Vital signs reviewed and stable  Post vital signs: Reviewed and stable  Last Vitals:  Vitals Value Taken Time  BP 101/69 12/26/20 0841  Temp    Pulse    Resp 14 12/26/20 0842  SpO2    Vitals shown include unvalidated device data.  Last Pain:  Vitals:   12/25/20 2000  TempSrc: Oral  PainSc:       Patients Stated Pain Goal: 5 (37/16/96 7893)  Complications: No complications documented.

## 2020-12-26 NOTE — Progress Notes (Signed)
OT Cancellation Note  Patient Details Name: [name] MRN: [id number] DOB: November 11, 1946   Cancelled Treatment:    Reason Eval/Treat Not Completed: Other (comment). Orders received, chart reviewed. Pt currently in OR for scheduled toe amputation. Will f/u as able & as pt is medically able to participate.  Fredirick Maudlin, OTR/L [place]

## 2020-12-26 NOTE — Op Note (Signed)
Operative note   Surgeon:Aliah Eriksson Lawyer: None    Preop diagnosis: Gangrene third toe right foot    Postop diagnosis: Same    Procedure: Amputation metatarsophalangeal joint third toe right foot    EBL: Minimal    Anesthesia:local and IV sedation.  Local consisted of a one-to-one mixture of 0.5% bupivacaine and Exparel long-acting anesthetic.  A total of 9 cc was used as a field block.    Hemostasis: None    Specimen: Gangrenous third toe for pathology and deep wound culture    Complications: None    Operative indications:[name] is an 75 y.o. that presents today for surgical intervention.  The risks/benefits/alternatives/complications have been discussed and consent has been given.    Procedure:  Patient was brought into the OR and placed on the operating table in thesupine position. After anesthesia was obtained theright lower extremity was prepped and draped in usual sterile fashion.  Attention was directed to the gangrenous right third toe at the level of the base of the proximal phalanx 2 semielliptical incisions were performed.  Full-thickness incision was taken down to the metatarsophalangeal joint.  The toe was then disarticulated.  This was sent for pathological examination.  A deep wound culture was performed.  The wound was flushed with copious amounts of irrigation.  Minimal bleeding of the tissue was noted.  Closure was then performed with a 3-0 Vicryl the deeper layer and a 4-0 nylon for the skin.  A bulky sterile well-padded dressing was applied.    Patient tolerated the procedure and anesthesia well.  Was transported from the OR to the PACU with all vital signs stable and vascular status intact. To be discharged per routine protocol back to floor/ICU.

## 2020-12-26 NOTE — Progress Notes (Signed)
NAME:  [name], MRN:  [id number], DOB:  1946/04/01, LOS: 4 ADMISSION DATE:  12/22/2020, CONSULTATION DATE:  12/23/2020 REFERRING MD:  Hezzie Bump, CHIEF COMPLAINT:  Altered Mental Status, Pain to 3rd toe of Right Foot   Brief History:  75 y.o. Female admitted with Severe PVD w/ Gangrene to the Right 3rd toe.  Vascular Surgery and Podiatry consulted. On 12/23/20 she underwent Angiogram to the RLE with mechanical thrombectomy and catheter directed thrombolytic therapy to the Right SFA, popliteal artery, and anterior tibial artery.  She is being transferred to ICU as she requires overnight thrombolytic therapy via infusion catheter.  History of Present Illness:  [name] is a 75 year old female with a past medical history most notable for severe peripheral arterial disease status post recent (12/02/2020) mechanical thrombectomy and stent placement to the  right distal SFA and popliteal artery and recent dry gangrene of the right third toe who presented to [facility] ED on 12/22/2020 due to complaints of altered mental status and worsening pain in her right third toe.  She reported increased redness, pain, and swelling of her right third toe over the past week.  She rated her pain 10 out of 10, with progression of redness extending up to her right ankle with associated chills.  She denied fever, chest pain, shortness of breath, nausea, vomiting, diarrhea, palpitations, dizziness, abdominal pain, dysuria, or trauma to the area.  ER course: Upon presentation to the ED she was noted to be tachycardic and tachypneic, otherwise hemodynamically stable.  Labs showed sodium 141, potassium 3.0, chloride 105, bicarb 24, glucose 110, BUN 15, creatinine 0.8, calcium 8.6, alkaline phosphatase 76, albumin 3.2, AST 15, ALT 10, lactic acid 1.2, WBC 10.0, hemoglobin 11.0, hematocrit 34.7, platelets 557, ESR 47.  Her COVID-19 PCR is negative.  X-ray of the right foot showed erosive changes of the third PIP compatible with septic  arthritis and osteomyelitis.  She was given empiric IV antibiotics.  Hospitalist was asked to admit the patient for further work-up and treatment of osteomyelitis of the right third toe.  Vascular surgery and podiatry were consulted.  ABI of the lower extremities was obtained and revealed severe right lower extremity peripheral artery disease with right ABI of 0.36.  The left ankle-brachial index was normal.  -12/23/2020 she underwent Angiogram to the RLE with mechanical thrombectomy and catheter directed thrombolytic therapy to the Right SFA, popliteal artery, and anterior tibial artery.  She is being transferred to ICU as she requires overnight thrombolytic therapy via infusion catheter.  PCCM is consulted as there is concern she may require Precedex infusion due to restlessness and need for bedrest/immobilization of the affected extremity.  2/04/-  Plan to transition from precedex to PO anxiolytic with medical optimization for transfer to med-surg.   12/25/2020- patient has improved clinically, her mood is stable and pleasant. Patient still on precedex and levophed.  Today plan is to wean both in preparation for surgery in am.    12/26/2020- patient is s/p surgery today.  Hb dropped to 7.2 with hypotension on arrival bp 72/48, will transfuse 1 unit and give IVF today. Will restart her home regimen medications including numerous centrally acting medications and will monitor closely today with plan to move to floor when able.    Past Medical History:  COPD CAD Peripheral Artery Disease Dyslipidemia GERD Hypothyroidism Lumbar spinal stenosis Anxiety Depression  Significant Hospital Events:  2/2: Presented to ED, to be admitted by Hospitalist; Vascular Surgery and Podiatry consulted 2/3: Underwent Angiogram  RLE, transfer to ICU post procedure for overnight thrombolytic therapy; PCCM consulted for possible Precedex  Consults:  Hospitalist (primary service) Vascular  surgery Podiatry PCCM  Procedures:  2/3: Angiogram right lower extremity 2/3: Left femoral CVC placed  Significant Diagnostic Tests:  2/2: X-ray Right foot>>Erosive changes of the third PIP compatible with septic arthritis and osteomyelitis. 2/2: ABI Lower Extremities>>1. Severe right lower extremity peripheral artery disease with right ABI = 0.36. 2. Normal left ankle brachial index.  Micro Data:  2/2: SARS-CoV-2 PCR>> negative 2/2: HIV screen>> nonreactive 2/2: Blood culture x2>> 2/2:MRSA PCR>> negative  Antimicrobials:  Cefepime x1 dose 2/2 Ceftriaxone 2/2>> Flagyl 2/2>> Vancomycin 3/2>>    Objective   Blood pressure (!) 109/49, pulse 85, temperature 98.5 F (36.9 C), resp. rate 14, height 5' 4"  (1.626 m), weight 51.3 kg, SpO2 100 %.        Intake/Output Summary (Last 24 hours) at 12/26/2020 1159 Last data filed at 12/26/2020 0908 Gross per 24 hour  Intake 2017.19 ml  Output 1330 ml  Net 687.19 ml   Filed Weights   12/22/20 0810 12/23/20 0907 12/23/20 1232  Weight: 59 kg 59 kg 51.3 kg    Examination: General: Acutely ill-appearing female, laying in the bed, on 4 L nasal cannula, no acute distress HENT: Atraumatic, normocephalic, neck supple, no JVD Lungs: Coarse breath sounds to bilateral lower lobes, otherwise clear, even, nonlabored Cardiovascular: Regular rate and rhythm, S1-S2, no murmurs, rubs, gallops Abdomen: Soft, nontender, nondistended, no guarding or rebound tenderness, bowel sounds positive x4 Extremities: Left foot without ulceration or gangrenous changes and diminished dorsalis pedis and posterior tibial pulses; right foot with gangrenous right third toe, absent dorsalis pedis and posterior tibial pulses Neuro: Sleeping soundly status post fentanyl Versed administration, pupils PERRLA Skin: Gangrene to the right third toe      Resolved Hospital Problem list   N/A  Assessment & Plan:    Gangrene of the Right Third Toe Severe Peripheral  Vascular Disease -Monitor fever curve -Trend WBC's -Follow cultures as above -Continue Rocephin, Flagyl, and Vancomycin for now pending cultures and sensitivities -Vascular Surgery and Podiatry following, appreciate input -S/p Angiogram of RLE on 12/23/20 with Mechanical thrombectomy and catheter directed thrombolytic therapy to the Right SFA, popliteal artery, and anterior tibial artery -To undergo Thrombolytic therapy overnight in ICU with repeat Angiogram tomorrow 12/24/20 -Pain control -Precedex if needed to ensure pt relaxed and able to tolerate bedrest -12/26/2020- patient is s/p surgery POD-0.  Slight drop in H/h with shock physiology on arrival will deliver IVF and 1 unit prbc today.    History of COPD, without Acute Exacerbation -Supplemental O2 as needed to maintain O2 sats 88 to 94% -Follow intermittent CXR & ABG as needed -PRN Bronchodilators -Continue Dulera and Singulair -Encourage smoking cessation   Best practice (evaluated daily)  Diet: NPO Pain/Anxiety/Delirium protocol (if indicated): Dilaudid, Precedex if needed VAP protocol (if indicated): N/A DVT prophylaxis: Heparin gtt GI prophylaxis: N/A Glucose control: N/A Mobility: Bedrest Disposition:ICU  Goals of Care:  Last date of multidisciplinary goals of care discussion:N/A Family and staff present: Updated pt at bedside 12/23/20 Summary of discussion: Overnight infusion of tPA with return for Angiogram tomorrow Follow up goals of care discussion due: 12/24/20 Code Status: Full Code  Labs   CBC: Recent Labs  Lab 12/22/20 0913 12/23/20 0510 12/24/20 0455 12/24/20 1059 12/24/20 1557 12/24/20 2245 12/25/20 0603 12/26/20 0521  WBC 10.0   < > 7.9 10.3 9.7 9.8 9.1 8.2  NEUTROABS 7.8*  --  5.9  --   --   --   --   --   HGB 11.0*   < > 8.9* 8.9* 8.8* 7.9* 7.8* 7.2*  HCT 34.7*   < > 27.9* 28.8* 27.5* 25.5* 25.1* 23.0*  MCV 87.8   < > 90.0 90.0 90.8 91.4 90.0 89.8  PLT 557*   < > 321 347 348 315 335 324   < > =  values in this interval not displayed.    Basic Metabolic Panel: Recent Labs  Lab 12/22/20 0913 12/23/20 0510 12/24/20 0441 12/25/20 0603 12/26/20 0521  NA 141 141 137 139 140  K 3.0* 4.2 3.6 3.4* 3.1*  CL 105 112* 108 110 109  CO2 24 22 19* 21* 25  GLUCOSE 110* 63* 89 90 99  BUN 15 9 5* <5* <5*  CREATININE 0.80 0.50 0.46 0.58 0.64  CALCIUM 8.6* 7.9* 7.6* 7.5* 7.7*  MG  --   --  1.4* 2.1 1.5*  PHOS  --   --  2.5 2.3* 1.9*   GFR: Estimated Creatinine Clearance: 50.7 mL/min (by C-G formula based on SCr of 0.64 mg/dL). Recent Labs  Lab 12/22/20 0913 12/23/20 0510 12/24/20 1557 12/24/20 2245 12/25/20 0603 12/26/20 0521  PROCALCITON  --   --   --   --  1.26 0.66  WBC 10.0   < > 9.7 9.8 9.1 8.2  LATICACIDVEN 1.2  --   --   --   --   --    < > = values in this interval not displayed.    Liver Function Tests: Recent Labs  Lab 12/22/20 0913  AST 15  ALT 10  ALKPHOS 76  BILITOT 0.4  PROT 6.9  ALBUMIN 3.2*   No results for input(s): LIPASE, AMYLASE in the last 168 hours. No results for input(s): AMMONIA in the last 168 hours.  ABG    Component Value Date/Time   TCO2 26 12/07/2011 1042     Coagulation Profile: No results for input(s): INR, PROTIME in the last 168 hours.  Cardiac Enzymes: No results for input(s): CKTOTAL, CKMB, CKMBINDEX, TROPONINI in the last 168 hours.  HbA1C: No results found for: HGBA1C  CBG: Recent Labs  Lab 12/25/20 2348 12/26/20 0322 12/26/20 0716 12/26/20 0722 12/26/20 1110  GLUCAP 120* 90 71 76 79    Review of Systems:   Unable to assess due to lethargic following fentanyl and versed administration   Past Medical History:  She,  has a past medical history of Anxiety, CAD (coronary artery disease), Carpal tunnel syndrome, Cataract, Complication of anesthesia, COPD (chronic obstructive pulmonary disease) ([place]), CRP elevated (09/14/2015), Depression, Difficult intubation, Dyslipidemia, Dyspnea, Dysrhythmia, Elevated  sedimentation rate (09/14/2015), Esophageal spasm, Gastrointestinal parasites, GERD (gastroesophageal reflux disease), Hiatal hernia, History of peptic ulcer disease, Hyperthyroidism, Hypothyroidism, Low magnesium levels (09/14/2015), Pelvic fracture ([place]) (2008), Reflux, Rotator cuff injury, and Stenosis, spinal, lumbar.   Surgical History:   Past Surgical History:  Procedure Laterality Date  . APPENDECTOMY    . BACK SURGERY     CERVICAL FUSION  . CARPAL TUNNEL RELEASE    . CATARACT EXTRACTION W/PHACO Right 08/07/2017   Procedure: CATARACT EXTRACTION PHACO AND INTRAOCULAR LENS PLACEMENT (IOC);  Surgeon: Birder Robson, MD;  Location: [facility] ORS;  Service: Ophthalmology;  Laterality: Right;  [place] 00:52.0 AP% 16.8 CDE 8.74 Fluid Pack Lot # [id number]  . CATARACT EXTRACTION W/PHACO Left 09/04/2017   Procedure: CATARACT EXTRACTION PHACO AND INTRAOCULAR LENS PLACEMENT (IOC);  Surgeon: Birder Robson, MD;  Location:  [facility] ORS;  Service: Ophthalmology;  Laterality: Left;  [place] 00:34 AP% 17.0 CDE 5.80 Fluid pack lot # [id number]  . CESAREAN SECTION    . CHOLECYSTECTOMY    . FRACTURE SURGERY    . HIP SURGERY    . INTRAMEDULLARY (IM) NAIL INTERTROCHANTERIC Right 10/01/2016   Procedure: INTRAMEDULLARY (IM) NAIL INTERTROCHANTRIC;  Surgeon: Corky Mull, MD;  Location: [facility] ORS;  Service: Orthopedics;  Laterality: Right;  . KYPHOPLASTY N/A 08/20/2018   Procedure: [id number];  Surgeon: Hessie Knows, MD;  Location: [facility] ORS;  Service: Orthopedics;  Laterality: N/A;  . LOWER EXTREMITY ANGIOGRAPHY Right 12/18/2019   Procedure: LOWER EXTREMITY ANGIOGRAPHY;  Surgeon: Algernon Huxley, MD;  Location: [place] [facility];  Service: Cardiovascular;  Laterality: Right;  . LOWER EXTREMITY ANGIOGRAPHY Right 12/02/2020   Procedure: LOWER EXTREMITY ANGIOGRAPHY;  Surgeon: Algernon Huxley, MD;  Location: [place] [facility] LAB;  Service: Cardiovascular;  Laterality: Right;  . LOWER EXTREMITY ANGIOGRAPHY Right 12/23/2020    Procedure: Lower Extremity Angiography;  Surgeon: Algernon Huxley, MD;  Location: [place] [facility] LAB;  Service: Cardiovascular;  Laterality: Right;  . LOWER EXTREMITY ANGIOGRAPHY Right 12/24/2020   Procedure: Lower Extremity Angiography;  Surgeon: Algernon Huxley, MD;  Location: [place] [facility];  Service: Cardiovascular;  Laterality: Right;  . NECK SURGERY    . NOSE SURGERY    . ROTATOR CUFF REPAIR     x2  . SHOULDER ARTHROSCOPY  12/07/2011   Procedure: ARTHROSCOPY SHOULDER;  Surgeon: Ninetta Lights, MD;  Location: [place];  Service: Orthopedics;  Laterality: Right;  Debridement Partial Cuff Tear, Release Coracoacromial Ligament  . SHOULDER SURGERY  12/07/2011   right     Social History:   reports that she has been smoking cigarettes. She has been smoking about 0.00 packs per day. She has never used smokeless tobacco. She reports that she does not drink alcohol and does not use drugs.   Family History:  Her family history includes Aneurysm in an other family member.   Allergies No Known Allergies   Home Medications  Prior to Admission medications   Medication Sig Start Date End Date Taking? Authorizing Provider  ALPRAZolam Duanne Moron) 0.5 MG tablet Take 0.5 mg by mouth every 6 (six) hours as needed for anxiety.   Yes [provider]  HYDROcodone-acetaminophen (NORCO/VICODIN) 5-325 MG tablet Take 1 tablet by mouth every 6 (six) hours as needed for moderate pain. 12/03/20   Kris Hartmann, NP  ALPRAZolam Duanne Moron) 1 MG tablet Take 1 tablet (1 mg total) by mouth 4 (four) times daily as needed. Patient taking differently: Take 1 mg by mouth 2 (two) times daily as needed. 10/04/16   Epifanio Lesches, MD  amLODipine (NORVASC) 5 MG tablet Take 1 tablet (5 mg total) by mouth daily. 08/22/18   Gladstone Lighter, MD  aspirin EC 81 MG tablet Take 1 tablet (81 mg total) by mouth daily. 12/02/20   Algernon Huxley, MD  atorvastatin (LIPITOR) 10 MG tablet Take 1 tablet (10 mg  total) by mouth daily. 12/18/19 12/17/20  Algernon Huxley, MD  buPROPion (WELLBUTRIN SR) 150 MG 12 hr tablet Take 150 mg by mouth daily.     [provider]  clopidogrel (PLAVIX) 75 MG tablet Take 1 tablet (75 mg total) by mouth daily. 12/18/19   Algernon Huxley, MD  escitalopram (LEXAPRO) 10 MG tablet Take 10 mg by mouth daily.  10/15/13   [provider]  Fluticasone-Salmeterol (ADVAIR DISKUS)  250-50 MCG/DOSE AEPB Inhale 1 puff into the lungs 2 (two) times daily. 10/24/18 03/16/20  Dustin Flock, MD  levothyroxine (SYNTHROID, LEVOTHROID) 88 MCG tablet Take 88 mcg by mouth daily before breakfast.     [provider]  montelukast (SINGULAIR) 10 MG tablet Take 10 mg by mouth daily.    [provider]  oxyCODONE (OXY IR/ROXICODONE) 5 MG immediate release tablet Take 1 tablet (5 mg total) by mouth 2 (two) times daily as needed for severe pain. Must last 30 days Patient not taking: Reported on 12/02/2020 11/11/20 12/11/20  Milinda Pointer, MD  oxyCODONE (OXY IR/ROXICODONE) 5 MG immediate release tablet Take 1 tablet (5 mg total) by mouth 2 (two) times daily as needed for severe pain. Must last 30 days 12/11/20 01/10/21  Milinda Pointer, MD  oxyCODONE (OXY IR/ROXICODONE) 5 MG immediate release tablet Take 1 tablet (5 mg total) by mouth 2 (two) times daily as needed for severe pain. Must last 30 days 01/10/21 02/09/21  Milinda Pointer, MD  tizanidine (ZANAFLEX) 2 MG capsule Take 1 capsule (2 mg total) by mouth 3 (three) times daily as needed for muscle spasms. Patient not taking: No sig reported 11/03/20 02/01/21  Milinda Pointer, MD  traZODone (DESYREL) 100 MG tablet Take 1 tablet (100 mg total) by mouth at bedtime. Patient taking differently: Take 200 mg by mouth at bedtime. 08/21/18   Gladstone Lighter, MD     Critical care time: 33 minutes    Critical care provider statement:    Critical care time (minutes):  33   Critical care time was exclusive of:  Separately  billable procedures and  treating other patients   Critical care was necessary to treat or prevent imminent or  life-threatening deterioration of the following conditions:     Critical care was time spent personally by me on the following  activities:  Development of treatment plan with patient or surrogate,  discussions with consultants, evaluation of patient's response to  treatment, examination of patient, obtaining history from patient or  surrogate, ordering and performing treatments and interventions, ordering  and review of laboratory studies and re-evaluation of patient's condition   I assumed direction of critical care for this patient from another  provider in my specialty: no     Ottie Glazier, M.D.  [facility] [place]

## 2020-12-26 NOTE — Progress Notes (Signed)
0930 Back from OR.

## 2020-12-26 NOTE — Progress Notes (Signed)
Transported to the OR via bed.

## 2020-12-26 NOTE — Progress Notes (Signed)
ANTICOAGULATION CONSULT NOTE  Pharmacy Consult for heparin Indication: s/p mechanical thrombectomy and catheter directed thrombolytic therapy  Patient Measurements: Heparin Dosing Weight: 51 kg  Labs: Recent Labs    12/24/20 0441 12/24/20 0455 12/24/20 2245 12/25/20 0603 12/25/20 1315 12/25/20 2310 12/26/20 0521 12/26/20 2206  HGB  --    < > 7.9* 7.8*  --   --  7.2*  --   HCT  --    < > 25.5* 25.1*  --   --  23.0*  --   PLT  --    < > 315 335  --   --  324  --   HEPARINUNFRC <0.10*  --   --   --  <0.10* 0.15*  --  0.52  CREATININE 0.46  --   --  0.58  --   --  0.64  --    < > = values in this interval not displayed.    Estimated Creatinine Clearance: 50.7 mL/min (by C-G formula based on SCr of 0.64 mg/dL).   Medical History: Past Medical History:  Diagnosis Date  . Anxiety   . CAD (coronary artery disease)   . Carpal tunnel syndrome   . Cataract   . Complication of anesthesia    has woken up at times  . COPD (chronic obstructive pulmonary disease) ([place])   . CRP elevated 09/14/2015  . Depression   . Difficult intubation   . Dyslipidemia   . Dyspnea    DOE  . Dysrhythmia    hx palpatations  . Elevated sedimentation rate 09/14/2015  . Esophageal spasm   . Gastrointestinal parasites   . GERD (gastroesophageal reflux disease)   . Hiatal hernia   . History of peptic ulcer disease   . Hyperthyroidism   . Hypothyroidism   . Low magnesium levels 09/14/2015  . Pelvic fracture ([place]) 2008   fall from riding a horse  . Reflux   . Rotator cuff injury   . Stenosis, spinal, lumbar    Medications No anticoagulants prior to admission per chart review  Assessment: 75 year old female with known h/o atherosclerotic disease with gangrenous changes to her right foot. Patient is s/p mechanical thrombectomy and catheter directed thrombolytic therapy. Patient started on heparin drip and then transitioned to Aggrastat post-procedure. Once aggrastat was completed heparin  infusion restarted. Heparin infusion then held again for amputation of metatarsophalangeal joint third toe right foot. Pharmacy consult for transition back to heparin drip.  Hgb 7.2 (post-procedure), plts 324   Goal of Therapy:  Heparin level 0.3-0.7 units/ml Monitor platelets by anticoagulation protocol: Yes   Plan:  2/6:  HL @ 2206 = 0.52 Will continue pt on current rate and draw confirmation level on 2/7 @ 0600.   Nikita Surman D, PharmD 12/26/2020 10:56 PM

## 2020-12-26 NOTE — Progress Notes (Signed)
PT Cancellation Note  Patient Details Name: [name] MRN: [id number] DOB: 01-23-1946   Cancelled Treatment:    Reason Eval/Treat Not Completed: Other (comment) Orders received, chart reviewed. Pt currently in OR for scheduled toe amputation. Will f/u as able & as pt is medically able to participate.  Lavone Nian, PT, DPT 12/26/20, 8:25 AM    [name] 12/26/2020, 8:23 AM

## 2020-12-27 ENCOUNTER — Encounter: Payer: Self-pay | Admitting: Podiatry

## 2020-12-27 DIAGNOSIS — L03115 Cellulitis of right lower limb: Secondary | ICD-10-CM | POA: Diagnosis not present

## 2020-12-27 DIAGNOSIS — I96 Gangrene, not elsewhere classified: Secondary | ICD-10-CM | POA: Diagnosis not present

## 2020-12-27 DIAGNOSIS — F17200 Nicotine dependence, unspecified, uncomplicated: Secondary | ICD-10-CM

## 2020-12-27 DIAGNOSIS — Z89421 Acquired absence of other right toe(s): Secondary | ICD-10-CM

## 2020-12-27 DIAGNOSIS — M869 Osteomyelitis, unspecified: Secondary | ICD-10-CM | POA: Diagnosis not present

## 2020-12-27 DIAGNOSIS — M86171 Other acute osteomyelitis, right ankle and foot: Secondary | ICD-10-CM | POA: Diagnosis not present

## 2020-12-27 DIAGNOSIS — I739 Peripheral vascular disease, unspecified: Secondary | ICD-10-CM | POA: Diagnosis not present

## 2020-12-27 LAB — CBC
HCT: 28.9 % — ABNORMAL LOW (ref 36.0–46.0)
Hemoglobin: 9.3 g/dL — ABNORMAL LOW (ref 12.0–15.0)
MCH: 29.1 pg (ref 26.0–34.0)
MCHC: 32.2 g/dL (ref 30.0–36.0)
MCV: 90.3 fL (ref 80.0–100.0)
Platelets: 296 10*3/uL (ref 150–400)
RBC: 3.2 MIL/uL — ABNORMAL LOW (ref 3.87–5.11)
RDW: 14.6 % (ref 11.5–15.5)
WBC: 7.3 10*3/uL (ref 4.0–10.5)
nRBC: 0 % (ref 0.0–0.2)

## 2020-12-27 LAB — TYPE AND SCREEN
ABO/RH(D): O POS
Antibody Screen: NEGATIVE
Unit division: 0

## 2020-12-27 LAB — CULTURE, BLOOD (ROUTINE X 2)
Culture: NO GROWTH
Culture: NO GROWTH

## 2020-12-27 LAB — BASIC METABOLIC PANEL
Anion gap: 8 (ref 5–15)
BUN: <5 mg/dL — ABNORMAL LOW (ref 8–23)
CO2: 28 mmol/L (ref 22–32)
Calcium: 7.7 mg/dL — ABNORMAL LOW (ref 8.9–10.3)
Chloride: 106 mmol/L (ref 98–111)
Creatinine, Ser: 0.51 mg/dL (ref 0.44–1.00)
GFR, Estimated: >60 mL/min (ref >60)
Glucose, Bld: 84 mg/dL (ref 70–99)
Potassium: 3.6 mmol/L (ref 3.5–5.1)
Sodium: 142 mmol/L (ref 135–145)

## 2020-12-27 LAB — BPAM RBC
Blood Product Expiration Date: 202203052359
ISSUE DATE / TIME: 202202061821
Unit Type and Rh: 5100

## 2020-12-27 LAB — GLUCOSE, CAPILLARY
Glucose-Capillary: 81 mg/dL (ref 70–99)
Glucose-Capillary: 89 mg/dL (ref 70–99)
Glucose-Capillary: 128 mg/dL — ABNORMAL HIGH (ref 70–99)
Glucose-Capillary: 103 mg/dL — ABNORMAL HIGH (ref 70–99)

## 2020-12-27 LAB — PHOSPHORUS: Phosphorus: 2.2 mg/dL — ABNORMAL LOW (ref 2.5–4.6)

## 2020-12-27 LAB — HEPARIN LEVEL (UNFRACTIONATED): Heparin Unfractionated: 0.32 IU/mL (ref 0.30–0.70)

## 2020-12-27 LAB — MAGNESIUM: Magnesium: 1.6 mg/dL — ABNORMAL LOW (ref 1.7–2.4)

## 2020-12-27 MED ORDER — POLYETHYLENE GLYCOL 3350 17 G PO PACK
17.0000 g | PACK | Freq: Every day | ORAL | Status: DC
  Administered 2020-12-27 – 2021-01-01 (×5): 17 g via ORAL
  Filled 2020-12-27 (×5): qty 1

## 2020-12-27 MED ORDER — MAGNESIUM SULFATE 2 GM/50ML IV SOLN
2.0000 g | Freq: Once | INTRAVENOUS | Status: AC
  Administered 2020-12-27: 2 g via INTRAVENOUS
  Filled 2020-12-27: qty 50

## 2020-12-27 MED ORDER — SENNOSIDES-DOCUSATE SODIUM 8.6-50 MG PO TABS
1.0000 | ORAL_TABLET | Freq: Two times a day (BID) | ORAL | Status: DC
  Administered 2020-12-27 – 2021-01-01 (×10): 1 via ORAL
  Filled 2020-12-27 (×10): qty 1

## 2020-12-27 MED ORDER — APIXABAN 5 MG PO TABS
5.0000 mg | ORAL_TABLET | Freq: Two times a day (BID) | ORAL | Status: DC
  Administered 2020-12-27 – 2021-01-01 (×10): 5 mg via ORAL
  Filled 2020-12-27 (×10): qty 1

## 2020-12-27 NOTE — Progress Notes (Signed)
ANTICOAGULATION CONSULT NOTE  Pharmacy Consult for heparin Indication: s/p mechanical thrombectomy and catheter directed thrombolytic therapy  Patient Measurements: Heparin Dosing Weight: 51 kg  Labs: Recent Labs    12/24/20 2245 12/25/20 0603 12/25/20 1315 12/25/20 2310 12/26/20 0521 12/26/20 2206 12/27/20 0541  HGB 7.9* 7.8*  --   --  7.2*  --   --   HCT 25.5* 25.1*  --   --  23.0*  --   --   PLT 315 335  --   --  324  --   --   HEPARINUNFRC  --   --    < > 0.15*  --  0.52 0.32  CREATININE  --  0.58  --   --  0.64  --   --    < > = values in this interval not displayed.    Estimated Creatinine Clearance: 50.7 mL/min (by C-G formula based on SCr of 0.64 mg/dL).   Medical History: Past Medical History:  Diagnosis Date  . Anxiety   . CAD (coronary artery disease)   . Carpal tunnel syndrome   . Cataract   . Complication of anesthesia    has woken up at times  . COPD (chronic obstructive pulmonary disease) ([place])   . CRP elevated 09/14/2015  . Depression   . Difficult intubation   . Dyslipidemia   . Dyspnea    DOE  . Dysrhythmia    hx palpatations  . Elevated sedimentation rate 09/14/2015  . Esophageal spasm   . Gastrointestinal parasites   . GERD (gastroesophageal reflux disease)   . Hiatal hernia   . History of peptic ulcer disease   . Hyperthyroidism   . Hypothyroidism   . Low magnesium levels 09/14/2015  . Pelvic fracture ([place]) 2008   fall from riding a horse  . Reflux   . Rotator cuff injury   . Stenosis, spinal, lumbar    Medications No anticoagulants prior to admission per chart review  Assessment: 75 year old female with known h/o atherosclerotic disease with gangrenous changes to her right foot. Patient is s/p mechanical thrombectomy and catheter directed thrombolytic therapy. Patient started on heparin drip and then transitioned to Aggrastat post-procedure. Once aggrastat was completed heparin infusion restarted. Heparin infusion then held again  for amputation of metatarsophalangeal joint third toe right foot. Pharmacy consult for transition back to heparin drip.  Hgb 7.2 (post-procedure), plts 324   Goal of Therapy:  Heparin level 0.3-0.7 units/ml Monitor platelets by anticoagulation protocol: Yes   Plan:  2/6:  HL @ 2206 = 0.52 Will continue pt on current rate and draw confirmation level on 2/7 @ 0600.   2/7:  HL @ 0541 = 0.32 Will continue pt on current rate and recheck HL on 2/8 with AM labs.   Leibish Mcgregor D, PharmD 12/27/2020 7:17 AM

## 2020-12-27 NOTE — Progress Notes (Signed)
NAME:  [name], MRN:  [id number], DOB:  Mar 20, 1946, LOS: 5 ADMISSION DATE:  12/22/2020, CONSULTATION DATE:  12/23/2020 REFERRING MD:  Hezzie Bump, CHIEF COMPLAINT:  Altered Mental Status, Pain to 3rd toe of Right Foot   Brief History:  75 y.o. Female admitted with Severe PVD w/ Gangrene to the Right 3rd toe.  Vascular Surgery and Podiatry consulted. On 12/23/20 she underwent Angiogram to the RLE with mechanical thrombectomy and catheter directed thrombolytic therapy to the Right SFA, popliteal artery, and anterior tibial artery.  She is being transferred to ICU as she requires overnight thrombolytic therapy via infusion catheter.  Significant Hospital Events:  12/22/2020: Presented to ED, to be admitted by Hospitalist; Vascular Surgery and Podiatry consulted 12/23/2020 she underwent Angiogram to the RLE with mechanical thrombectomy and catheter directed thrombolytic therapy to the Right SFA, popliteal artery, and anterior tibial artery.  She is being transferred to ICU as she requires overnight thrombolytic therapy via infusion catheter. PCCM is consulted as there is concern she may require Precedex infusion due to restlessness and need for bedrest/immobilization of the affected extremity. 2/04/-  Plan to transition from precedex to PO anxiolytic with medical optimization for transfer to med-surg.  12/25/2020- patient has improved clinically, her mood is stable and pleasant. Patient still on precedex and levophed.  Today plan is to wean both in preparation for surgery in am.   12/26/2020- patient is s/p surgery today.  Hb dropped to 7.2 with hypotension on arrival bp 72/48, will transfuse 1 unit and give IVF today. Will restart her home regimen medications including numerous centrally acting medications and will monitor closely today with plan to move to floor when able.   Consults:  Hospitalist (primary service) Vascular surgery Podiatry PCCM  Procedures:  2/3: Angiogram right lower  extremity 2/3: Left femoral CVC placed  Significant Diagnostic Tests:  2/2: X-ray Right foot>>Erosive changes of the third PIP compatible with septic arthritis and osteomyelitis. 2/2: ABI Lower Extremities>>1. Severe right lower extremity peripheral artery disease with right ABI = 0.36. 2. Normal left ankle brachial index.  Micro Data:  2/2: SARS-CoV-2 PCR>> negative 2/2: HIV screen>> nonreactive 2/2: Blood culture x2>>negative 2/2:MRSA PCR>> negative  Antimicrobials:  Cefepime x1 dose 2/2 Ceftriaxone 2/2>>2/4 Flagyl 2/2>>2/4 Vancomycin 3/2>>2/4 Ancef 2/4>>    Objective   Blood pressure (!) 116/58, pulse 81, temperature 98.8 F (37.1 C), temperature source Oral, resp. rate 15, height 5\' 4"  (1.626 m), weight 51.3 kg, SpO2 99 %.        Intake/Output Summary (Last 24 hours) at 12/27/2020 1159 Last data filed at 12/27/2020 0500 Gross per 24 hour  Intake 1235.66 ml  Output 1100 ml  Net 135.66 ml   Filed Weights   12/22/20 0810 12/23/20 0907 12/23/20 1232  Weight: 59 kg 59 kg 51.3 kg    Examination: General: Conically ill-appearing female, laying in the bed, on 4 L nasal cannula, no acute distress HENT: Atraumatic, normocephalic, neck supple, no JVD Lungs: Coarse breath sounds to bilateral lower lobes, otherwise clear, even, nonlabored Cardiovascular: Regular rate and rhythm, S1-S2, no murmurs, rubs, gallops Abdomen: Soft, nontender, nondistended, no guarding or rebound tenderness, bowel sounds positive x4 Extremities: Left foot without ulceration or gangrenous changes and diminished dorsalis pedis and posterior tibial pulses; right foot bandaged status post third toe amputation, absent dorsalis pedis and posterior tibial pulses Neuro: No overt focal deficit. 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 2/6 -No hemodynamic instability   History of COPD, without Acute Exacerbation -Supplemental O2 as needed to maintain O2 sats 88 to 94% -Follow intermittent CXR & ABG as needed -PRN Bronchodilators -Continue Dulera and Singulair -Encourage smoking cessation  Best practice (evaluated daily)  Diet: Regular  Pain/Anxiety/Delirium protocol (if indicated): Morphine as needed, oxycodone VAP protocol (if indicated): N/A DVT prophylaxis: Heparin gtt GI prophylaxis: N/A Glucose control: N/A Mobility: Begin PT/OT as per vascular Disposition: MedSurg  Goals of Care:  Last date of multidisciplinary goals of care discussion:N/A Family and staff present:  Summary of discussion: Follow up goals of care discussion due:  Code Status: Full Code  Labs   CBC: Recent Labs  Lab 12/22/20 0913 12/23/20 0510 12/24/20 0455 12/24/20 1059 12/24/20 1557 12/24/20 2245 12/25/20 0603 12/26/20 0521 12/27/20 0541  WBC 10.0   < > 7.9   < > 9.7 9.8 9.1 8.2 7.3  NEUTROABS 7.8*  --  5.9  --   --   --   --   --   --   HGB 11.0*   < > 8.9*   < > 8.8* 7.9* 7.8* 7.2* 9.3*  HCT 34.7*   < > 27.9*   < > 27.5* 25.5* 25.1* 23.0* 28.9*  MCV 87.8   < > 90.0   < > 90.8 91.4 90.0 89.8 90.3  PLT 557*   < > 321   < > 348 315 335 324 296   < > = values in this interval not displayed.    Basic Metabolic Panel: Recent Labs  Lab 12/23/20 0510 12/24/20 0441 12/25/20 0603 12/26/20 0521 12/27/20 0541  NA 141 137 139 140 142  K 4.2 3.6 3.4* 3.1* 3.6  CL 112* 108 110 109 106  CO2 22 19* 21* 25 28  GLUCOSE 63* 89 90 99 84  BUN 9 5* <5* <5* <5*  CREATININE 0.50 0.46 0.58 0.64 0.51   CALCIUM 7.9* 7.6* 7.5* 7.7* 7.7*  MG  --  1.4* 2.1 1.5* 1.6*  PHOS  --  2.5 2.3* 1.9* 2.2*   GFR: Estimated Creatinine Clearance: 50.7 mL/min (by C-G formula based on SCr of 0.51 mg/dL). Recent Labs  Lab 12/22/20 0913 12/23/20 0510 12/24/20 2245 12/25/20 0603 12/26/20 0521 12/27/20 0541  PROCALCITON  --   --   --  1.26 0.66  --   WBC 10.0   < > 9.8 9.1 8.2 7.3  LATICACIDVEN 1.2  --   --   --   --   --    < > = values in this interval not displayed.    Liver Function Tests: Recent Labs  Lab 12/22/20 0913  AST 15  ALT 10  ALKPHOS 76  BILITOT 0.4  PROT 6.9  ALBUMIN 3.2*   No results for input(s): LIPASE, AMYLASE in the last 168 hours. No results for input(s): AMMONIA in the last 168 hours.  ABG    Component Value Date/Time   TCO2 26 12/07/2011 1042     Coagulation Profile: No results for input(s): INR, PROTIME in the last 168 hours.  Cardiac Enzymes: No results for input(s): CKTOTAL, CKMB,  CKMBINDEX, TROPONINI in the last 168 hours.  HbA1C: No results found for: HGBA1C  CBG: Recent Labs  Lab 12/26/20 1521 12/26/20 2329 12/27/20 0537 12/27/20 0738 12/27/20 1122  GLUCAP 75 94 81 89 128*    Review of Systems:   A 10 point review of systems was performed and it is as noted above otherwise negative.   Allergies No Known Allergies   Current medications  Scheduled Meds: . apixaban  5 mg Oral BID  . aspirin EC  81 mg Oral Daily  . atorvastatin  10 mg Oral Daily  . buPROPion  150 mg Oral Daily  . chlordiazePOXIDE  25 mg Oral BID  . Chlorhexidine Gluconate Cloth  6 each Topical Daily  . escitalopram  10 mg Oral Daily  . levothyroxine  88 mcg Oral QAC breakfast  . mometasone-formoterol  2 puff Inhalation BID  . montelukast  10 mg Oral Daily  . nicotine  14 mg Transdermal Daily  . polyethylene glycol  17 g Oral Daily  . senna-docusate  1 tablet Oral BID  . traZODone  200 mg Oral QHS   Continuous Infusions: .  ceFAZolin (ANCEF) IV Stopped  (12/27/20 1125)   PRN Meds:.ALPRAZolam, ipratropium-albuterol, morphine injection, ondansetron **OR** ondansetron (ZOFRAN) IV, oxyCODONE, tiZANidine    Level 3 follow-up    Multidisciplinary rounds rounds were performed with the ICU team.  Patient be transferred to [place].  Will ask hospitalist group to assume care 2/8.  Renold Don, MD [place] [facility]   *This note was dictated using voice recognition software/Dragon.  Despite best efforts to proofread, errors can occur which can change the meaning.  Any change was purely unintentional.

## 2020-12-27 NOTE — Progress Notes (Signed)
1 Day Post-Op   Subjective/Chief Complaint: Patient seen.  Continues to complain of severe pain in her right foot.  Patient just had morphine 30 minutes ago   Objective: Vital signs in last 24 hours: Temp:  [98.6 F (37 C)-98.9 F (37.2 C)] 98.8 F (37.1 C) (02/07 0500) Pulse Rate:  [77-100] 81 (02/07 0700) Resp:  [14-25] 15 (02/07 0700) BP: (93-142)/(55-90) 116/58 (02/07 0700) SpO2:  [99 %-100 %] 99 % (02/07 0700) Last BM Date: 12/20/20  Intake/Output from previous day: 02/06 0701 - 02/07 0700 In: 1635.7 [P.O.:240; I.V.:765.7; Blood:480; IV Piggyback:150] Out: 0350 [Urine:1300; Blood:5] Intake/Output this shift: No intake/output data recorded.  No bleeding noted on the bandaging.  The incision at the second toe amputation site is well coapted with no cellulitis.  Some dusky appearance is noted in the second toe concerning for early gangrenous change.      Lab Results:  Recent Labs    12/26/20 0521 12/27/20 0541  WBC 8.2 7.3  HGB 7.2* 9.3*  HCT 23.0* 28.9*  PLT 324 296   BMET Recent Labs    12/26/20 0521 12/27/20 0541  NA 140 142  K 3.1* 3.6  CL 109 106  CO2 25 28  GLUCOSE 99 84  BUN <5* <5*  CREATININE 0.64 0.51  CALCIUM 7.7* 7.7*   PT/INR No results for input(s): LABPROT, INR in the last 72 hours. ABG No results for input(s): PHART, HCO3 in the last 72 hours.  Invalid input(s): PCO2, PO2  Studies/Results: DG MINI C-ARM IMAGE ONLY  Result Date: 12/26/2020 There is no interpretation for this exam.  This order is for images obtained during a surgical procedure.  Please See "Surgeries" Tab for more information regarding the procedure.    Anti-infectives: Anti-infectives (From admission, onward)   Start     Dose/Rate Route Frequency Ordered Stop   12/24/20 0830  ceFAZolin (ANCEF) IVPB 2g/100 mL premix       Note to Pharmacy: To be given in specials   2 g 200 mL/hr over 30 Minutes Intravenous  Once 12/24/20 0818 12/24/20 0855   12/24/20 0600   ceFAZolin (ANCEF) IVPB 1 g/50 mL premix        1 g 100 mL/hr over 30 Minutes Intravenous Every 8 hours 12/23/20 2244     12/23/20 1000  vancomycin (VANCOCIN) IVPB 1000 mg/200 mL premix  Status:  Discontinued        1,000 mg 200 mL/hr over 60 Minutes Intravenous Every 24 hours 12/22/20 1204 12/23/20 2244   12/23/20 0945  ceFAZolin (ANCEF) IVPB 2g/100 mL premix       Note to Pharmacy: To be given in specials   2 g 200 mL/hr over 30 Minutes Intravenous  Once 12/23/20 0854 12/24/20 0750   12/22/20 1800  cefTRIAXone (ROCEPHIN) 2 g in sodium chloride 0.9 % 100 mL IVPB  Status:  Discontinued        2 g 200 mL/hr over 30 Minutes Intravenous Every 24 hours 12/22/20 1127 12/23/20 2244   12/22/20 1130  metroNIDAZOLE (FLAGYL) IVPB 500 mg  Status:  Discontinued        500 mg 100 mL/hr over 60 Minutes Intravenous Every 8 hours 12/22/20 1127 12/23/20 2244   12/22/20 1115  ceFEPIme (MAXIPIME) 2 g in sodium chloride 0.9 % 100 mL IVPB        2 g 200 mL/hr over 30 Minutes Intravenous  Once 12/22/20 1100 12/22/20 1149   12/22/20 1115  vancomycin (VANCOCIN) IVPB 1000 mg/200 mL premix  1,000 mg 200 mL/hr over 60 Minutes Intravenous  Once 12/22/20 1100 12/22/20 1447      Assessment/Plan: s/p Procedure(s): AMPUTATION TOE-3rd Toes (Right) Assessment: Stable status post amputation second toe   Plan: Betadine and a sterile bandage reapplied to the right foot.  Continue to follow closely while in the hospital.  LOS: 5 days    Durward Fortes 12/27/2020

## 2020-12-27 NOTE — Progress Notes (Signed)
Order received from Dr Patsey Berthold to discontinue CBG's

## 2020-12-27 NOTE — Progress Notes (Signed)
Patient is POD 1. S/p 1u PRBC. Continues with reports of unrelieved pain although patient is sleeping after receiving pain medication oxy and morphine administered several times. HOB kept right foot elevated.Hep gtt maintained at 10.59ml/hr. Patient continues on 2L O2 via [place]. CHG bath completed. Call light kept within reach, water at bedside. Turned and repositioned encouraged. Refused incentive spirometer. WIll continue to monitor and endorse.

## 2020-12-27 NOTE — Evaluation (Signed)
Occupational Therapy Evaluation Patient Details Name: [name] MRN: [id number] DOB: [birth year] Today's Date: 12/27/2020    History of Present Illness [name] is a 45yoF who [facility] on 2/2 c negcrotic Rt 3rd toe, 3d AMS. 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 PMH: GAD, CAD, COPD, depression, DOE, GERD, hyperTSH. hypoTSH, hypomagnesemia, lumbar spinal stenosis.   Clinical Impression   Pt seen for OT evaluation this date. Upon arrival to room, pt seated EOB, finishing breakfast with personal care attendant Kennyth Lose) at bedside. According to personal care attendant, pt required assistance for "at least 75%" of ADLs. This date, pt required MAX A for UB bathing and dressing, with personal care attendant noting that pt now requires increased time and effort to participate in ADLs. Personal care attendant also reports that prior to admission, pt was able to walk from recliner to bathroom (~20 feet) with RW and MIN A. OOB mobility was deferred d/t inability to don small-sized post-op shoe to follow weightbearing precautions; RN informed that post-op shoe was too small. Pt would benefit from additional skilled OT services to maximize independence with ADLs and functional mobility, and prevent further decondiitoning. Upon discharge, recommend SNF.     Follow Up Recommendations  SNF    Equipment Recommendations  Other (comment) (defer to next venue of care)       Precautions / Restrictions Precautions Precautions: Fall Restrictions Weight Bearing Restrictions: Yes RLE Weight Bearing: Partial weight bearing RLE Partial Weight Bearing Percentage or Pounds: Postop shoe with OOB mobility      Mobility Bed Mobility Overal bed mobility: Needs Assistance Bed Mobility: Sit to Supine     Supine to sit: Min guard Sit to supine: Min assist   General bed mobility comments: MIN A for LE management    Transfers               General  transfer comment: OOB mobility deferred d/t inability to don post-op shoe and follow weightbearing precautions; RN informed that post-op shoe was too small    Balance Overall balance assessment: Needs assistance Sitting-balance support: Single extremity supported;Feet unsupported Sitting balance-Leahy Scale: Fair Sitting balance - Comments: Pt had difficulty engaging in ADLs while sitting at EOB without single UE support                                   ADL either performed or assessed with clinical judgement   ADL Overall ADL's : Needs assistance/impaired         Upper Body Bathing: Maximal assistance;Sitting Upper Body Bathing Details (indicate cue type and reason): Required increased time and effort, with pt reporting fatigue following washing 1/2 of R UE     Upper Body Dressing : Maximal assistance;Sitting Upper Body Dressing Details (indicate cue type and reason): To doff/don hospital gown. Pt able to flex b/l elbows to thread hands through gown, however unable to doff/don sleeves over shoulders.                   General ADL Comments: Personal care attendant at bedside reporting that pt requires more time/effort with ADLs than usual                  Pertinent Vitals/Pain Pain Assessment: 0-10 Pain Score: 6  Pain Location: surgical foot Pain Descriptors / Indicators: Aching Pain Intervention(s): Limited activity within patient's tolerance;Monitored during session;Premedicated  before session;Repositioned        Extremity/Trunk Assessment Upper Extremity Assessment Upper Extremity Assessment: Generalized weakness   Lower Extremity Assessment Lower Extremity Assessment: Generalized weakness       Communication Communication Communication: HOH   Cognition Arousal/Alertness: Awake/alert Behavior During Therapy: WFL for tasks assessed/performed Overall Cognitive Status: Difficult to assess                                             Home Living Family/patient expects to be discharged to:: Private residence Living Arrangements: Alone Available Help at Discharge: Family;Personal care attendant;Available PRN/intermittently (Personal care attendant comes to house 7x/week, but not available 24/7. Per personal care attendant, family is currently looking for someone to supervise pt overnight) Type of Home: House Home Access: Stairs to enter     Home Layout: One level               Home Equipment: Environmental consultant - 2 wheels          Prior Functioning/Environment Level of Independence: Needs assistance  Gait / Transfers Assistance Needed: Personal care attendant Kennyth Lose) reports that pt was able to walk from recliner to bathroom (~58feet) with RW and MIN A ADL's / Homemaking Assistance Needed: Personal care attendant reports that she provides help for "at least 75%" of ADLs. This date, she reports that pt appears to require increased time and effort to participate in ADLs            OT Problem List: Decreased strength;Decreased activity tolerance;Impaired balance (sitting and/or standing)      OT Treatment/Interventions:      OT Goals(Current goals can be found in the care plan section) Acute Rehab OT Goals Patient Stated Goal: none stated OT Goal Formulation: With patient Time For Goal Achievement: 01/10/21 Potential to Achieve Goals: Good ADL Goals Pt Will Perform Grooming: with min assist;sitting Pt Will Perform Upper Body Dressing: with mod assist;sitting Pt Will Transfer to Toilet: with mod assist;bedside commode  OT Frequency: Min 1X/week   Barriers to D/C: Decreased caregiver support             AM-PAC OT "6 Clicks" Daily Activity     Outcome Measure Help from another person eating meals?: A Little Help from another person taking care of personal grooming?: A Lot Help from another person toileting, which includes using toliet, bedpan, or urinal?: Total Help from another person bathing  (including washing, rinsing, drying)?: A Lot Help from another person to put on and taking off regular upper body clothing?: A Lot Help from another person to put on and taking off regular lower body clothing?: A Lot 6 Click Score: 12   End of Session Nurse Communication: Mobility status;Other (comment) (size of post-op shoe)  Activity Tolerance: Patient tolerated treatment well Patient left: in bed;with call bell/phone within reach;with bed alarm set;with family/visitor present  OT Visit Diagnosis: Unsteadiness on feet (R26.81);Muscle weakness (generalized) (M62.81)                Time: 1610-9604 OT Time Calculation (min): 30 min Charges:  OT General Charges $OT Visit: 1 Visit OT Evaluation $OT Eval Moderate Complexity: 1 Mod OT Treatments $Self Care/Home Management : 8-22 mins  Fredirick Maudlin, OTR/L [place]

## 2020-12-27 NOTE — Progress Notes (Signed)
Left vm to update family member on chart of move to room 222.

## 2020-12-27 NOTE — Progress Notes (Signed)
Patient arrived from ICU. Heparin has been discontinued

## 2020-12-27 NOTE — Progress Notes (Signed)
   Date of Admission:  12/22/2020     ID: [name] is a 75 y.o. 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 apixaban  5 mg Oral BID  . aspirin EC  81 mg Oral Daily  . atorvastatin  10 mg Oral Daily  . buPROPion  150 mg Oral Daily  . chlordiazePOXIDE  25 mg Oral BID  . Chlorhexidine Gluconate Cloth  6 each Topical Daily  . escitalopram  10 mg Oral Daily  . levothyroxine  88 mcg Oral QAC breakfast  . mometasone-formoterol  2 puff Inhalation BID  . montelukast  10 mg Oral Daily  . nicotine  14 mg Transdermal Daily  . polyethylene glycol  17 g Oral Daily  . senna-docusate  1 tablet Oral BID  . traZODone  200 mg Oral QHS    Objective: Vital signs in last 24 hours: Temp:  [98.6 F (37 C)-98.9 F (37.2 C)] 98.8 F (37.1 C) (02/07 0500) Pulse Rate:  [77-100] 81 (02/07 0700) Resp:  [14-25] 15 (02/07 0700) BP: (93-142)/(55-90) 116/58 (02/07 0700) SpO2:  [99 %-100 %] 99 % (02/07 0700)  PHYSICAL EXAM:  General: Alert, cooperative, no distress, appears stated age. Hard of hearing Head: Normocephalic, without obvious abnormality, atraumatic. Eyes: Conjunctivae clear, anicteric sclerae. Pupils are equal ENT Nares normal. No drainage or sinus tenderness. Lips, mucosa, and tongue normal. No Thrush Neck: Supple, symmetrical, no adenopathy, thyroid: non tender no carotid bruit and no JVD. Back: No CVA tenderness. Lungs: b/l air entry  Heart: Regular rate and rhythm, no murmur, rub or gallop. Abdomen: Soft, non-tender,not distended. Bowel sounds normal. No masses Extremities:         atraumatic, no cyanosis.  No edema. No clubbing Skin: No rashes or lesions. Or bruising Lymph: Cervical, supraclavicular normal. Neurologic: Grossly non-focal  Lab Results Recent Labs    12/26/20 0521 12/27/20 0541  WBC 8.2 7.3  HGB 7.2* 9.3*  HCT 23.0* 28.9*  NA 140 142  K 3.1* 3.6  CL 109 106  CO2 25 28  BUN <5* <5*  CREATININE 0.64 0.51   Liver Panel No results for input(s): PROT, ALBUMIN, AST, ALT, ALKPHOS, BILITOT, BILIDIR, IBILI in the last 72 hours. Sedimentation Rate No results for input(s): ESRSEDRATE in the last 72 hours. C-Reactive Protein No results for input(s): CRP in the last 72 hours.  Microbiology:  Studies/Results: DG MINI C-ARM IMAGE ONLY  Result Date: 12/26/2020 There is no interpretation for this exam.  This order is for images obtained during a surgical procedure.  Please See "Surgeries" Tab for more information regarding the procedure.     Assessment/Plan:  Peripheral arterial disease with dry gangrene of the third toe on the right foot.  She has undergone amputation of the toe She also had undergone thrombectomy and TPA for the right SFA.  She is currently on cefazolin.  Await culture results decide on p.o. antibiotic  Current smoker  COPD  Hypothyroidism on Synthroid  Hypertension on amlodipine  Depression anxiety on multiple medications  Discussed the management with the care team.

## 2020-12-27 NOTE — Progress Notes (Signed)
Pt alert and oriented X4. Mild confusion noted at times, but easily redirected.  HOH. Able to communicate needs. Day one post op- toe amputation surgery (3rd toe of right foot). Bandaging in place so unable to assess site  no bleeding noted on bandages. C/o chronic back pain and right foot pain. Requests PRN pain medication every 2 hours. Being transferred to room 222, report called in to the receiving RN Linna Hoff. Post op shoe boot sent with pt.

## 2020-12-27 NOTE — Progress Notes (Signed)
[facility] Daily Progress Note   Subjective: 12/23/20: 1. Ultrasound guidance for vascular access left femoral artery 2. Catheter placement into right common femoral artery from left femoral approach 3. Aortogram and selective right lower extremity angiogram 4.  Mechanical thrombectomy to the right SFA, popliteal artery, and anterior tibial artery with the penumbra cat 6 device 5.  Percutaneous transluminal angioplasty of right anterior tibial artery with 2.5 mm diameter by 30 cm length angioplasty balloon             6.  Catheter directed thrombolytic therapy with 4 mg of TPA instilled in the right SFA, popliteal artery, and anterior tibial artery with the infusion catheter which will be continued for overnight thrombolytic therapy 7.  Ultrasound guidance for vascular access left femoral vein             8.   Placement of a left femoral triple-lumen catheter for venous access with ultrasound guidance  12/24/20: 1.  Right lower extremity angiogram 2.  Mechanical thrombectomy with the penumbra cat 6 device to the right SFA 3.  Percutaneous transluminal angioplasty of the right anterior tibial artery with 3 mm diameter angioplasty balloon 4. StarClose closure device left femoral artery  Patient sitting up eating comfortably in bed.  Right lower extremity discomfort.  Objective: Vitals:   12/27/20 0100 12/27/20 0500 12/27/20 0600 12/27/20 0700  BP: 124/62 (!) 120/55 114/62 (!) 116/58  Pulse: 83 77 85 81  Resp: 14 14 14 15   Temp: 98.8 F (37.1 C) 98.8 F (37.1 C)    TempSrc: Oral Oral    SpO2: 100% 99% 99% 99%  Weight:      Height:        Intake/Output Summary (Last 24 hours) at 12/27/2020 1048 Last data filed at 12/27/2020 0500 Gross per 24 hour  Intake 1235.66 ml  Output 1100 ml  Net 135.66 ml   Physical Exam: A&Ox3, NAD CV:  RRR Pulmonary: CTA Bilaterally Abdomen: Soft, Nontender, Nondistended Vascular:  Thigh soft.  Calf soft.  Extremity is warm distally.  Podiatry dressing is clean dry and intact.   Laboratory: CBC    Component Value Date/Time   WBC 7.3 12/27/2020 0541   HGB 9.3 (L) 12/27/2020 0541   HCT 28.9 (L) 12/27/2020 0541   PLT 296 12/27/2020 0541   BMET    Component Value Date/Time   NA 142 12/27/2020 0541   NA 143 10/06/2019 1552   NA 138 02/10/2014 1335   K 3.6 12/27/2020 0541   K 4.5 02/10/2014 1335   CL 106 12/27/2020 0541   CL 106 02/10/2014 1335   CO2 28 12/27/2020 0541   CO2 28 02/10/2014 1335   GLUCOSE 84 12/27/2020 0541   GLUCOSE 99 02/10/2014 1335   BUN <5 (L) 12/27/2020 0541   BUN 7 (L) 10/06/2019 1552   BUN 6 (L) 02/10/2014 1335   CREATININE 0.51 12/27/2020 0541   CREATININE 0.89 02/10/2014 1335   CALCIUM 7.7 (L) 12/27/2020 0541   CALCIUM 8.7 02/10/2014 1335   GFRNONAA >60 12/27/2020 0541   GFRNONAA >60 02/10/2014 1335   GFRAA >60 12/18/2019 1012   GFRAA >60 02/10/2014 1335   Assessment/Planning: The patient is a 75 year old female with known history of atherosclerotic disease to the right lower extremity status post endovascular intervention x2  1) once cleared surgically by podiatry would resume baby aspirin and Eliquis for anticoagulation along with statin. 2) when medically and surgically stable okay from vascular standpoint to be discharged 3)  we will continue to follow in the outpatient setting  Discussed with Dr. Ellis Parents [facility] PA-C 12/27/2020 10:48 AM

## 2020-12-27 NOTE — Evaluation (Signed)
Physical Therapy Evaluation Patient Details Name: [name] MRN: [id number] DOB: 02-Sep-1946 Today's Date: 12/27/2020   History of Present Illness  [name] is a 75yoF who comes to [facility] on 2/2 c negcrotic Rt 3rd toe, 3d AMS. 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 PMH: GAD, CAD, COPD, depression, DOE, GERD, hyperTSH. hypoTSH, hypomagnesemia, lumbar spinal stenosis.  Clinical Impression  Pt admitted with above diagnosis. Pt currently with functional limitations due to the deficits listed below (see "PT Problem List"). Upon entry, pt in bed, awake and agreeable to participate. The pt is alert, pleasant, interactive, and able to provide only basic info regarding prior level of function (detail limited by [facility] and/or tangential response), both in tolerance and independence. Pt historically has been struggling with AMB at home, doing so only to/from toilet, and not particularly successful with heel weightbearing as directed. Deferred transfers and AMB to next session once pt has post-op shoe and weightbearing status.   Patient's performance this date reveals decreased ability, independence, and tolerance in performing all basic mobility required for performance of activities of daily living. Pt requires additional DME, close physical assistance, and cues for safe participate in mobility. Pt will benefit from skilled PT intervention to increase independence and safety with basic mobility in preparation for discharge to the venue listed below.       Follow Up Recommendations SNF;Supervision for mobility/OOB    Equipment Recommendations  Wheelchair (measurements PT);Wheelchair cushion (measurements PT) (elevated legs rests)    Recommendations for Other Services       Precautions / Restrictions Precautions Precautions: Fall Restrictions Weight Bearing Restrictions: Yes RLE Weight Bearing: Partial weight bearing RLE Partial Weight Bearing Percentage  or Pounds: Postop shoe      Mobility  Bed Mobility Overal bed mobility: Needs Assistance Bed Mobility: Supine to Sit;Sit to Supine     Supine to sit: Min guard Sit to supine: Min guard   General bed mobility comments: difficulty sitting at EOB without single UE support,    Transfers Overall transfer level:  (deferred, no weight bearing status, no post op shoe; pt reports significant difficulty with AMB and NWB PTA)                  Ambulation/Gait                Stairs            Wheelchair Mobility    Modified Rankin (Stroke Patients Only)       Balance Overall balance assessment: Needs assistance Sitting-balance support: Single extremity supported;Feet unsupported Sitting balance-Leahy Scale: Fair                                       Pertinent Vitals/Pain Pain Assessment: 0-10 Pain Score: 6  Pain Location: surgical foot Pain Descriptors / Indicators: Aching Pain Intervention(s): Limited activity within patient's tolerance;Monitored during session;Premedicated before session;Repositioned    Home Living Family/patient expects to be discharged to:: Private residence Living Arrangements: Alone Available Help at Discharge: Family (SOunds like she has assistance from DTR and other non-family helpers) Type of Home: House Home Access: Stairs to enter     Home Layout: One level Home Equipment: Environmental consultant - 2 wheels      Prior Function                 Hand Dominance  Extremity/Trunk Assessment   Upper Extremity Assessment Upper Extremity Assessment: Generalized weakness    Lower Extremity Assessment Lower Extremity Assessment: Generalized weakness       Communication      Cognition Arousal/Alertness: Awake/alert Behavior During Therapy: WFL for tasks assessed/performed Overall Cognitive Status: Difficult to assess                                 General Comments: occasionally  tangential      General Comments      Exercises     Assessment/Plan    PT Assessment Patient needs continued PT services  PT Problem List Decreased strength;Decreased range of motion;Decreased activity tolerance;Decreased balance;Decreased mobility;Decreased safety awareness;Decreased knowledge of use of DME       PT Treatment Interventions DME instruction;Balance training;Gait training;Stair training;Functional mobility training;Therapeutic activities;Therapeutic exercise;Patient/family education    PT Goals (Current goals can be found in the Care Plan section)  Acute Rehab PT Goals Patient Stated Goal: be stronger with AMB, allow foot to heal PT Goal Formulation: With patient Time For Goal Achievement: 01/10/21 Potential to Achieve Goals: Fair    Frequency 7X/week   Barriers to discharge Decreased caregiver support;Inaccessible home environment stair at entry, lives alone    Co-evaluation               AM-PAC PT "6 Clicks" Mobility  Outcome Measure Help needed turning from your back to your side while in a flat bed without using bedrails?: A Little Help needed moving from lying on your back to sitting on the side of a flat bed without using bedrails?: A Little Help needed moving to and from a bed to a chair (including a wheelchair)?: A Lot Help needed standing up from a chair using your arms (e.g., wheelchair or bedside chair)?: A Lot Help needed to walk in hospital room?: Total Help needed climbing 3-5 steps with a railing? : Total 6 Click Score: 12    End of Session Equipment Utilized During Treatment: Oxygen Activity Tolerance: Patient tolerated treatment well;Patient limited by fatigue;Patient limited by pain Patient left: in bed;with call bell/phone within reach;with nursing/sitter in room Nurse Communication: Mobility status;Patient requests pain meds;Weight bearing status;Precautions PT Visit Diagnosis: Difficulty in walking, not elsewhere classified  (R26.2);Other abnormalities of gait and mobility (R26.89)    Time: 7412-8786 PT Time Calculation (min) (ACUTE ONLY): 23 min   Charges:   PT Evaluation $PT Eval Moderate Complexity: 1 Mod         12:06 PM, 12/27/20 Etta Grandchild, PT, DPT Physical Therapist - [facility]  [phone] ([place])   [place] 12/27/2020, 12:04 PM

## 2020-12-27 NOTE — Progress Notes (Signed)
O2 sat 77% on RA, O2 applied at [facility] with improved O2 sat of 91%. Will continue to monitor. Barbaraann Faster, RN 9:16 PM; 12/27/2020

## 2020-12-28 DIAGNOSIS — M869 Osteomyelitis, unspecified: Secondary | ICD-10-CM | POA: Diagnosis not present

## 2020-12-28 LAB — BASIC METABOLIC PANEL
Anion gap: 6 (ref 5–15)
BUN: <5 mg/dL — ABNORMAL LOW (ref 8–23)
CO2: 30 mmol/L (ref 22–32)
Calcium: 8.2 mg/dL — ABNORMAL LOW (ref 8.9–10.3)
Chloride: 104 mmol/L (ref 98–111)
Creatinine, Ser: 0.53 mg/dL (ref 0.44–1.00)
GFR, Estimated: >60 mL/min (ref >60)
Glucose, Bld: 89 mg/dL (ref 70–99)
Potassium: 3.7 mmol/L (ref 3.5–5.1)
Sodium: 140 mmol/L (ref 135–145)

## 2020-12-28 LAB — PHOSPHORUS: Phosphorus: 3 mg/dL (ref 2.5–4.6)

## 2020-12-28 LAB — MAGNESIUM: Magnesium: 1.6 mg/dL — ABNORMAL LOW (ref 1.7–2.4)

## 2020-12-28 NOTE — Progress Notes (Signed)
Daily Progress Note   Subjective  - 2 Days Post-Op  F/u 3rd toe amputaiton.  C/o pain diffusely  Objective Vitals:   12/28/20 0614 12/28/20 0831 12/28/20 1203 12/28/20 1204  BP: 130/76 117/65 108/60   Pulse: 97 (!) 101 100 97  Resp: 18 16 16    Temp: 97.7 F (36.5 C) 97.8 F (36.6 C) 97.6 F (36.4 C)   TempSrc: Oral Oral Oral   SpO2: 94% 96% (!) 79% 92%  Weight:      Height:        Physical Exam: Amp site is stable. 2nd toe dusky but no real change from pre-op.  Continue to monitor.  Laboratory CBC    Component Value Date/Time   WBC 7.3 12/27/2020 0541   HGB 9.3 (L) 12/27/2020 0541   HCT 28.9 (L) 12/27/2020 0541   PLT 296 12/27/2020 0541    BMET    Component Value Date/Time   NA 140 12/28/2020 0546   NA 143 10/06/2019 1552   NA 138 02/10/2014 1335   K 3.7 12/28/2020 0546   K 4.5 02/10/2014 1335   CL 104 12/28/2020 0546   CL 106 02/10/2014 1335   CO2 30 12/28/2020 0546   CO2 28 02/10/2014 1335   GLUCOSE 89 12/28/2020 0546   GLUCOSE 99 02/10/2014 1335   BUN <5 (L) 12/28/2020 0546   BUN 7 (L) 10/06/2019 1552   BUN 6 (L) 02/10/2014 1335   CREATININE 0.53 12/28/2020 0546   CREATININE 0.89 02/10/2014 1335   CALCIUM 8.2 (L) 12/28/2020 0546   CALCIUM 8.7 02/10/2014 1335   GFRNONAA >60 12/28/2020 0546   GFRNONAA >60 02/10/2014 1335   GFRAA >60 12/18/2019 1012   GFRAA >60 02/10/2014 1335    Assessment/Planning: Gangrene 3rd toe s/p amputation   Dressing changed.    Will need close f/u outpt  No routine dressing changes needed at this time.  Will continue to monitor for worsening s/s.   No further podiatry intervention planned at this time.  Samara Deist A  12/28/2020, 1:43 PM

## 2020-12-28 NOTE — Progress Notes (Signed)
PROGRESS NOTE    [name]  VWU:[id number] DOB: February 20, 1946 DOA: 12/22/2020 PCP: Albina Billet, MD    Brief Narrative:  [name] is a 75 y.o. female with medical history significant for peripheral arterial disease status post recent mechanical thrombectomy of the right distal SFA and popliteal artery as well/ PTCA of the right anterior tibial artery and stent angioplasty of the SFA, popliteal artery and right anterior tibial artery, history of COPD, hypertension who presents to the ER for evaluation of mental status changes as well as worsening pain in her right third toe. Patient had dry gangrene of the right third toe and over the last 1 week she has noticed increased redness, pain and swelling involving that toe.  She rates her pain a 10 x 10 in intensity at its worst and has had to increase the frequency of administration of her pain medicines due to worsening pain.  She now has redness that had extended up to her right ankle associated with chills but denies having any fever. Her COVID-19 PCR is negative.  X-ray of the right foot showed erosive changes of the third PIP compatible with septic arthritis and osteomyelitis.  She was given empiric IV antibiotics. Hospitalist was asked to admit the patient for further work-up and treatment of osteomyelitis of the right third toe.  Vascular surgery and podiatry were consulted.   On 12/23/2020 she underwent Angiogram to the RLE with mechanical thrombectomy and catheter directed thrombolytic therapy to the Right SFA, popliteal artery, and anterior tibial artery.  She is being transferred to ICU as she requires overnight thrombolytic therapy via infusion catheter. PCCM is consulted as there is concern she may require Precedex infusion due to restlessness and need for bedrest/immobilization of the affected extremity.  12/24/20-  Plan to transition from precedex to PO anxiolytic with medical optimization for transfer to med-surg.   12/25/2020- patient  has improved clinically, her mood is stable and pleasant. Patient still on precedex and levophed.  Today plan is to wean both in preparation for surgery in am.    2/6-Amputation metatarsophalangeal joint third toe right foot.Hb dropped to 7.2 with hypotension on arrival bp 72/48, will transfuse 1 unit and give IVF .  2/8-TRH pickup today .    Consultants:   Vascular, ID, podiatry, critical care  Procedures:  2/2: X-ray Right foot>>Erosive changes of the third PIP compatible with septic arthritis and osteomyelitis. 2/2: ABI Lower Extremities>>1. Severe right lower extremity peripheral artery disease with right ABI = 0.36. 2. Normal left ankle brachial index.  Micro Data:  2/2: SARS-CoV-2 PCR>> negative 2/2: HIV screen>> nonreactive 2/2: Blood culture x2>>negative 2/2:MRSA PCR>> negative  Antimicrobials:  Cefepime x1 dose 2/2 Ceftriaxone 2/2>>2/4 Flagyl 2/2>>2/4 Vancomycin 3/2>>2/4 Ancef 2/4>>   Subjective: Has no complaints other than foot pain 7 out of 10.  Objective: Vitals:   12/27/20 2039 12/27/20 2333 12/28/20 0614 12/28/20 0831  BP: 118/64 125/63 130/76 117/65  Pulse: (!) 105 86 97 (!) 101  Resp: 18 18 18 16   Temp: 98 F (36.7 C) (!) 97.5 F (36.4 C) 97.7 F (36.5 C) 97.8 F (36.6 C)  TempSrc: Oral Oral Oral Oral  SpO2: 91% 100% 94% 96%  Weight:      Height:        Intake/Output Summary (Last 24 hours) at 12/28/2020 0844 Last data filed at 12/28/2020 0500 Gross per 24 hour  Intake 200.97 ml  Output 1600 ml  Net -1399.03 ml   [employer]  12/23/20 0907 12/23/20 1232 12/27/20 1443  Weight: 59 kg 51.3 kg 57.4 kg    Examination:  General exam: Appears calm and comfortable  Respiratory system: Clear to auscultation. Respiratory effort normal. Cardiovascular system: S1 & S2 heard, RRR. No JVD, murmurs, rubs, gallops or clicks.  Gastrointestinal system: Abdomen is nondistended, soft and nontender.Normal bowel sounds heard. Central nervous system:  Alert and oriented.  Grossly intact Extremities:  right foot dressing in place Skin: Warm dry Psychiatry:  Mood & affect appropriate.     Data Reviewed: I have personally reviewed following labs and imaging studies  CBC: Recent Labs  Lab 12/22/20 0913 12/23/20 0510 12/24/20 0455 12/24/20 1059 12/24/20 1557 12/24/20 2245 12/25/20 0603 12/26/20 0521 12/27/20 0541  WBC 10.0   < > 7.9   < > 9.7 9.8 9.1 8.2 7.3  NEUTROABS 7.8*  --  5.9  --   --   --   --   --   --   HGB 11.0*   < > 8.9*   < > 8.8* 7.9* 7.8* 7.2* 9.3*  HCT 34.7*   < > 27.9*   < > 27.5* 25.5* 25.1* 23.0* 28.9*  MCV 87.8   < > 90.0   < > 90.8 91.4 90.0 89.8 90.3  PLT 557*   < > 321   < > 348 315 335 324 296   < > = values in this interval not displayed.   Basic Metabolic Panel: Recent Labs  Lab 12/24/20 0441 12/25/20 0603 12/26/20 0521 12/27/20 0541 12/28/20 0546  NA 137 139 140 142 140  K 3.6 3.4* 3.1* 3.6 3.7  CL 108 110 109 106 104  CO2 19* 21* 25 28 30   GLUCOSE 89 90 99 84 89  BUN 5* <5* <5* <5* <5*  CREATININE 0.46 0.58 0.64 0.51 0.53  CALCIUM 7.6* 7.5* 7.7* 7.7* 8.2*  MG 1.4* 2.1 1.5* 1.6* 1.6*  PHOS 2.5 2.3* 1.9* 2.2* 3.0   GFR: Estimated Creatinine Clearance: 54.1 mL/min (by C-G formula based on SCr of 0.53 mg/dL). Liver Function Tests: Recent Labs  Lab 12/22/20 0913  AST 15  ALT 10  ALKPHOS 76  BILITOT 0.4  PROT 6.9  ALBUMIN 3.2*   No results for input(s): LIPASE, AMYLASE in the last 168 hours. No results for input(s): AMMONIA in the last 168 hours. Coagulation Profile: No results for input(s): INR, PROTIME in the last 168 hours. Cardiac Enzymes: No results for input(s): CKTOTAL, CKMB, CKMBINDEX, TROPONINI in the last 168 hours. BNP (last 3 results) No results for input(s): PROBNP in the last 8760 hours. HbA1C: No results for input(s): HGBA1C in the last 72 hours. CBG: Recent Labs  Lab 12/26/20 2329 12/27/20 0537 12/27/20 0738 12/27/20 1122 12/27/20 1700  GLUCAP 94 81 89  128* 103*   Lipid Profile: No results for input(s): CHOL, HDL, LDLCALC, TRIG, CHOLHDL, LDLDIRECT in the last 72 hours. Thyroid Function Tests: No results for input(s): TSH, T4TOTAL, FREET4, T3FREE, THYROIDAB in the last 72 hours. Anemia Panel: No results for input(s): VITAMINB12, FOLATE, FERRITIN, TIBC, IRON, RETICCTPCT in the last 72 hours. Sepsis Labs: Recent Labs  Lab 12/22/20 0913 12/25/20 0603 12/26/20 0521  PROCALCITON  --  1.26 0.66  LATICACIDVEN 1.2  --   --     Recent Results (from the past 240 hour(s))  Blood culture (routine x 2)     Status: None   Collection Time: 12/22/20  9:13 AM   Specimen: BLOOD  Result Value Ref Range Status  Specimen Description BLOOD LEFT ANTECUBITAL  Final   Special Requests   Final    BOTTLES DRAWN AEROBIC AND ANAEROBIC Blood Culture results may not be optimal due to an excessive volume of blood received in culture bottles   Culture   Final    NO GROWTH 5 DAYS Performed at [facility], [address] [place], [place] [address]    Report Status 12/27/2020 FINAL  Final  Blood culture (routine x 2)     Status: None   Collection Time: 12/22/20  9:13 AM   Specimen: BLOOD  Result Value Ref Range Status   Specimen Description BLOOD LEFT WRIST  Final   Special Requests   Final    BOTTLES DRAWN AEROBIC AND ANAEROBIC Blood Culture results may not be optimal due to an excessive volume of blood received in culture bottles   Culture   Final    NO GROWTH 5 DAYS Performed at [facility], [address] [place], [place] [address]    Report Status 12/27/2020 FINAL  Final  SARS Coronavirus 2 by RT PCR (hospital order, performed in [place] hospital lab) Nasopharyngeal Nasopharyngeal Swab     Status: None   Collection Time: 12/22/20 12:05 PM   Specimen: Nasopharyngeal Swab  Result Value Ref Range Status   SARS Coronavirus 2 NEGATIVE NEGATIVE Final    Comment: (NOTE) SARS-CoV-2 target nucleic acids are NOT DETECTED.  The  SARS-CoV-2 RNA is generally detectable in upper and lower respiratory specimens during the acute phase of infection. The lowest concentration of SARS-CoV-2 viral copies this assay can detect is 250 copies / mL. A negative result does not preclude SARS-CoV-2 infection and should not be used as the sole basis for treatment or other patient management decisions.  A negative result may occur with improper specimen collection / handling, submission of specimen other than nasopharyngeal swab, presence of viral mutation(s) within the areas targeted by this assay, and inadequate number of viral copies (<250 copies / mL). A negative result must be combined with clinical observations, patient history, and epidemiological information.  Fact Sheet for Patients:   StrictlyIdeas.no  Fact Sheet for Healthcare Providers: BankingDealers.co.za  This test is not yet approved or  cleared by the [place] FDA and has been authorized for detection and/or diagnosis of SARS-CoV-2 by FDA under an Emergency Use Authorization (EUA).  This EUA will remain in effect (meaning this test can be used) for the duration of the COVID-19 declaration under Section 564(b)(1) of the Act, 21 U.S.C. section 360bbb-3(b)(1), unless the authorization is terminated or revoked sooner.  Performed at [facility], [address], [place], [place] [address]   Surgical PCR screen     Status: None   Collection Time: 12/22/20  7:47 PM   Specimen: Nasal Mucosa; Nasal Swab  Result Value Ref Range Status   MRSA, PCR NEGATIVE NEGATIVE Final   Staphylococcus aureus NEGATIVE NEGATIVE Final    Comment: (NOTE) The Xpert SA Assay (FDA approved for NASAL specimens in patients 43 years of age and older), is one component of a comprehensive surveillance program. It is not intended to diagnose infection nor to guide or monitor treatment. Performed at [facility], [address] [place],  [address]   Aerobic/Anaerobic Culture (surgical/deep wound)     Status: None (Preliminary result)   Collection Time: 12/26/20  8:20 AM   Specimen: PATH Other; Tissue  Result Value Ref Range Status   Specimen Description   Final  TOE Performed at [facility], [address], [place], [place] [address]    Special Requests   Final    NONE Performed at [facility], [address] [place], [place] [address]    Gram Stain PENDING  Incomplete   Culture   Final    NO GROWTH < 24 HOURS Performed at [facility], [place] [address] [place], [place] [address]    Report Status PENDING  Incomplete         Radiology Studies: No results found.      Scheduled Meds: . apixaban  5 mg Oral BID  . aspirin EC  81 mg Oral Daily  . atorvastatin  10 mg Oral Daily  . buPROPion  150 mg Oral Daily  . chlordiazePOXIDE  25 mg Oral BID  . Chlorhexidine Gluconate Cloth  6 each Topical Daily  . escitalopram  10 mg Oral Daily  . levothyroxine  88 mcg Oral QAC breakfast  . mometasone-formoterol  2 puff Inhalation BID  . montelukast  10 mg Oral Daily  . nicotine  14 mg Transdermal Daily  . polyethylene glycol  17 g Oral Daily  . senna-docusate  1 tablet Oral BID  . traZODone  200 mg Oral QHS   Continuous Infusions: .  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 antibiotics -Dressing changed by podiatry Will need close follow-up with podiatry as outpatient No change dressing changes needed at this time We will continue to monitor for worsening signs and symptoms No further podiatry intervention planned at this time Restarted on Eliquis, aspirin and statin When medically and surgically stable okay from vascular standpoint to be discharged Will need to follow-up as outpatient setting   History of COPD, without Acute Exacerbation Continue inhalers Continue Dulera and Singulair Encourage smoking cessation Supplemental oxygen to keep maintaining O2 sat 88 to 94%  Nicotine dependence Smoking cessation has been discussed with patient in detail Continue nicotine patch  Hypertension-continue amlodipine  Depression and anxiety Continue on trazodone, Wellbutrin, Lexapro    Hypothyroidism Continue Synthroid       DVT prophylaxis: Eliquis Code Status: Full Family Communication: None at bedside  Status is: Inpatient  Remains inpatient appropriate because:Inpatient level of care appropriate due to severity of illness   Dispo: The patient is from: Home              Anticipated d/c is to: SNF              Anticipated d/c date is: 2 days              Patient currently is not medically stable to d/c.  Follow-up IDs recommendation on antibiotics and needs SNF placement   Difficult to place patient No            LOS: 6 days  Time spent: 35 minutes with more than 50% on [place], MD [facility] Pager [phone]  If 7PM-7AM, please contact night-coverage 12/28/2020, 8:44 AM

## 2020-12-28 NOTE — Progress Notes (Signed)
PT Cancellation Note  Patient Details Name: [name] MRN: [id number] DOB: 09-10-1946   Cancelled Treatment:    Reason Eval/Treat Not Completed: Fatigue/lethargy limiting ability to participate;Patient's level of consciousness (Author returning for 2nd PT session, pt still in chair, asleep, lunch tray partially eaten. Pt awakens briefly with heavy stimulus, but remains asleep largely, not safe to attempt transfer back to bed.) Pt fully reclined, trunk repositioned central to pillow, pillow placed under calves bilaterally. RN made aware.    Annasophia Crocker C 12/28/2020, 3:15 PM

## 2020-12-28 NOTE — Progress Notes (Signed)
Physical Therapy Treatment Patient Details Name: [name] MRN: [id number] DOB: 1946-03-27 Today's Date: 12/28/2020    History of Present Illness [name] is a 75yoF who [facility] on [facility] c negcrotic Rt 3rd toe, 3d AMS. 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 PMH: GAD, CAD, COPD, depression, DOE, GERD, hyperTSH. hypoTSH, hypomagnesemia, lumbar spinal stenosis.    PT Comments    Pt in bed upon entry, working on breakfast, meeting with hospitalist. Pt is agreeable to transfer to recliner to assess transfers mobility and facilitate breakfast eating. No assist needed to EOB. Pt requires modA to rise from bed, totalA for sock and postop shoe donning, maxA for swing to recliner maintaining PWB RLE. Pt follows cues well, calm and focused in session most of time. Pt is severely HOH, hence words spoke with power and clarity very near the ear. As per baseline mobility PTA, pt lacks required BUE strength to AMB with BUE, RW, maintaining RLE PWB. Will continue to recommend STR at DC to maximize heeling potential of amputations, however pt will need WC for mobility once ready for return to home.     Follow Up Recommendations  SNF;Supervision for mobility/OOB     Equipment Recommendations  Wheelchair (measurements PT);Wheelchair cushion (measurements PT)    Recommendations for Other Services       Precautions / Restrictions Precautions Precautions: Fall Restrictions RLE Weight Bearing: Partial weight bearing    Mobility  Bed Mobility Overal bed mobility: Needs Assistance Bed Mobility: Supine to Sit     Supine to sit: Modified independent (Device/Increase time)        Transfers Overall transfer level: Needs assistance Equipment used: 1 person hand held assist Transfers: Stand Pivot Transfers   Stand pivot transfers: Max assist (modA to lift, maxA to swing)       General transfer comment: PWB, post op shoe  Ambulation/Gait                  Stairs             Wheelchair Mobility    Modified Rankin (Stroke Patients Only)       Balance                                            Cognition Arousal/Alertness: Awake/alert Behavior During Therapy: WFL for tasks assessed/performed Overall Cognitive Status: Difficult to assess                                 General Comments: occasionally tangential      Exercises      General Comments        Pertinent Vitals/Pain Pain Assessment: 0-10 Pain Score: 7  Pain Location: surgical foot Pain Descriptors / Indicators: Aching Pain Intervention(s): Limited activity within patient's tolerance    Home Living                      Prior Function            PT Goals (current goals can now be found in the care plan section) Acute Rehab PT Goals Patient Stated Goal: Pt wants to go home PT Goal Formulation: With patient Time For Goal Achievement: 01/10/21 Potential to Achieve Goals: Fair Progress towards PT goals:  Progressing toward goals    Frequency    7X/week      PT Plan Current plan remains appropriate    Co-evaluation              AM-PAC PT "6 Clicks" Mobility   Outcome Measure  Help needed turning from your back to your side while in a flat bed without using bedrails?: A Little Help needed moving from lying on your back to sitting on the side of a flat bed without using bedrails?: A Little Help needed moving to and from a bed to a chair (including a wheelchair)?: A Lot Help needed standing up from a chair using your arms (e.g., wheelchair or bedside chair)?: A Lot Help needed to walk in hospital room?: Total Help needed climbing 3-5 steps with a railing? : Total 6 Click Score: 12    End of Session Equipment Utilized During Treatment: Oxygen Activity Tolerance: Patient tolerated treatment well;Patient limited by fatigue;Patient limited by pain Patient left: in bed;with call  bell/phone within reach;with nursing/sitter in room Nurse Communication: Mobility status;Patient requests pain meds;Weight bearing status;Precautions PT Visit Diagnosis: Difficulty in walking, not elsewhere classified (R26.2);Other abnormalities of gait and mobility (R26.89)     Time: 0950-1004 PT Time Calculation (min) (ACUTE ONLY): 14 min  Charges:  $Gait Training: 8-22 mins                     11:37 AM, 12/28/20 Etta Grandchild, PT, DPT Physical Therapist - [facility]  [phone] ([place])    Green Rahm C 12/28/2020, 11:33 AM

## 2020-12-28 NOTE — Care Management Important Message (Signed)
Important Message  Patient Details  Name: [name] MRN: [id number] Date of Birth: 12/24/1945   Medicare Important Message Given:  Yes     Dannette Barbara 12/28/2020, 1:38 PM

## 2020-12-29 ENCOUNTER — Ambulatory Visit (INDEPENDENT_AMBULATORY_CARE_PROVIDER_SITE_OTHER): Payer: Medicare HMO | Admitting: Nurse Practitioner

## 2020-12-29 ENCOUNTER — Encounter (INDEPENDENT_AMBULATORY_CARE_PROVIDER_SITE_OTHER): Payer: Medicare HMO

## 2020-12-29 DIAGNOSIS — I96 Gangrene, not elsewhere classified: Secondary | ICD-10-CM | POA: Diagnosis not present

## 2020-12-29 DIAGNOSIS — I10 Essential (primary) hypertension: Secondary | ICD-10-CM | POA: Diagnosis not present

## 2020-12-29 DIAGNOSIS — M869 Osteomyelitis, unspecified: Secondary | ICD-10-CM | POA: Diagnosis not present

## 2020-12-29 DIAGNOSIS — F32A Depression, unspecified: Secondary | ICD-10-CM | POA: Diagnosis not present

## 2020-12-29 DIAGNOSIS — Z720 Tobacco use: Secondary | ICD-10-CM

## 2020-12-29 DIAGNOSIS — J449 Chronic obstructive pulmonary disease, unspecified: Secondary | ICD-10-CM

## 2020-12-29 LAB — PHOSPHORUS: Phosphorus: 3.4 mg/dL (ref 2.5–4.6)

## 2020-12-29 LAB — SURGICAL PATHOLOGY

## 2020-12-29 LAB — MAGNESIUM: Magnesium: 1.5 mg/dL — ABNORMAL LOW (ref 1.7–2.4)

## 2020-12-29 MED ORDER — GABAPENTIN 100 MG PO CAPS
200.0000 mg | ORAL_CAPSULE | Freq: Three times a day (TID) | ORAL | Status: DC
  Administered 2020-12-29 – 2020-12-31 (×7): 200 mg via ORAL
  Filled 2020-12-29 (×9): qty 2

## 2020-12-29 MED ORDER — MAGNESIUM SULFATE 2 GM/50ML IV SOLN
2.0000 g | Freq: Once | INTRAVENOUS | Status: AC
  Administered 2020-12-29: 2 g via INTRAVENOUS
  Filled 2020-12-29: qty 50

## 2020-12-29 NOTE — Progress Notes (Signed)
PROGRESS NOTE    [name]  OXB:[id number] DOB: [birth year] DOA: 12/22/2020 PCP: Albina Billet, MD   Brief Narrative:  75 y.o.femalewith medical history significant forperipheral arterial disease status post recent mechanical thrombectomy of the right distal SFA and popliteal artery as well/PTCA of the right anterior tibial artery and stent angioplasty of the SFA,popliteal artery and right anterior tibial artery, history of COPD, hypertension who presents to the ER for evaluation of mental status changes as well as worsening pain in her right third toe. Patient had dry gangrene of the right third toe and over the last 1 week she has noticed increased redness, pain and swelling involving that toe. She rates her pain a 10 x 10 in intensity at its worst and has had to increase the frequency of administration of her pain medicines due to worsening pain. She now has redness that had extended up to her right ankle associated with chills but denies having any fever.  On 12/23/2020 she underwentAngiogram to the RLE with mechanical thrombectomy and catheter directed thrombolytic therapy to the Right SFA, popliteal artery, and anterior tibial artery. Required short ICU stay given use of thrombolytics and restlessness requiring Precedex infusion.  Precedex weaned off and patient was transferred to [facility] service on 12/28/2020.  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 2/6 -ID, podiatry and vascular  following Plan: Continue Ancef Follow cultures Podiatry for wound checks and dressing changes Continue aspirin, Eliquis, statin Discharge skilled nursing facility once culture data available in facility found  History of COPD, without Acute Exacerbation Continue inhalers Continue Dulera and Singulair Encourage smoking cessation Supplemental oxygen to keep maintaining O2 sat 88 to 94%  Nicotine dependence Smoking cessation has been discussed with patient in detail Continue nicotine patch  Hypertension continue amlodipine  Depression and anxiety Continue on trazodone, Wellbutrin, Lexapro  Hypothyroidism Continue Synthroid   DVT prophylaxis: Eliquis Code Status: Full Family Communication: None today.  Offered to call family but patient declined Disposition Plan: Status is: Inpatient  Remains inpatient appropriate because:Unsafe d/c plan and Inpatient level of care appropriate due to severity of illness   Dispo: The patient is from: Home              Anticipated d/c is to: SNF              Anticipated d/c date is: 2 days              Patient currently is not medically stable to d/c.   Difficult to place patient No  Bed search initiated.  Would anticipate 24 hours prior to culture data being available.  At that time we will know whether patient needs long-term IV antibiotics or we can transition to oral.     Level of care: Med-Surg  Consultants:   Podiatry  ID  Procedures:   Left third toe amputation  Antimicrobials:   Cefazolin   Subjective: Patient seen and examined.  Endorses pain in right toe.  Otherwise asymptomatic.  Objective: Vitals:   12/28/20 2132 12/29/20 0004 12/29/20 0419 12/29/20 1113  BP: 129/70 132/71 136/75 123/72  Pulse: 95 81 86 89  Resp: 18 18 20 18   Temp: 98.3 F (36.8 C) 98.1 F (36.7 C) 97.6 F (36.4 C) 98 F (36.7 C)  TempSrc: Oral  Oral Oral  SpO2: 91% 97% 99% 97%  Weight:      Height:        Intake/Output  Summary (Last 24 hours) at 12/29/2020 1437 Last data filed at 12/29/2020 1002 Gross per 24 hour  Intake 420 ml  Output 300 ml  Net 120 ml   Filed Weights   12/23/20 0907 12/23/20 1232 12/27/20 1443  Weight: 59 kg 51.3 kg 57.4 kg    Examination:  General exam: No acute distress Respiratory system: Bibasilar crackles.  Normal work of breathing.  2 L Cardiovascular system: S1-S2, regular rate and rhythm, no murmurs, no pedal edema Gastrointestinal system: Nontender, nondistended, normal bowel sounds Central nervous system: Alert and oriented. No focal neurological deficits. Extremities: Decreased power bilateral lower extremities.  Status post left third toe amputation Skin: Status post left third toe amputation Psychiatry: Judgement and insight appear normal. Mood & affect appropriate.     Data Reviewed: I have personally reviewed following labs and imaging studies  CBC: Recent Labs  Lab 12/24/20 0455 12/24/20 1059 12/24/20 1557 12/24/20 2245 12/25/20 0603 12/26/20 0521 12/27/20 0541  WBC 7.9   < > 9.7 9.8 9.1 8.2 7.3  NEUTROABS 5.9  --   --   --   --   --   --   HGB 8.9*   < > 8.8* 7.9* 7.8* 7.2* 9.3*  HCT 27.9*   < > 27.5* 25.5* 25.1* 23.0* 28.9*  MCV 90.0   < > 90.8 91.4 90.0 89.8 90.3  PLT 321   < > 348 315 335 324 296   < > = values in this interval not displayed.   Basic Metabolic Panel: Recent Labs  Lab 12/24/20 0441 12/25/20 0603 12/26/20 0521 12/27/20 0541 12/28/20 0546 12/29/20 0514  NA 137 139 140 142 140  --   K 3.6 3.4* 3.1* 3.6 3.7  --   CL 108 110 109 106 104  --   CO2 19* 21* 25 28 30   --   GLUCOSE 89 90 99 84 89  --   BUN 5* <5* <5* <5* <5*  --   CREATININE 0.46 0.58 0.64 0.51 0.53  --   CALCIUM 7.6* 7.5* 7.7* 7.7* 8.2*  --   MG 1.4* 2.1 1.5* 1.6* 1.6* 1.5*  PHOS 2.5 2.3* 1.9* 2.2* 3.0 3.4   GFR: Estimated Creatinine Clearance: 54.1 mL/min (by C-G formula based on SCr of 0.53 mg/dL). Liver Function Tests: No results for input(s): AST,  ALT, ALKPHOS, BILITOT, PROT, ALBUMIN in the last 168 hours. No results for input(s): LIPASE, AMYLASE in the last 168 hours. No results for input(s): AMMONIA in the last 168 hours. Coagulation Profile: No results for input(s): INR, PROTIME in the last 168 hours. Cardiac Enzymes: No results for input(s): CKTOTAL, CKMB, CKMBINDEX, TROPONINI in the last 168 hours. BNP (last 3 results) No results for input(s): PROBNP in the last 8760 hours. HbA1C: No results for input(s): HGBA1C in the last 72 hours. CBG: Recent Labs  Lab 12/26/20 2329 12/27/20 0537 12/27/20 0738 12/27/20 1122 12/27/20 1700  GLUCAP 94 81 89 128* 103*   Lipid Profile: No results for input(s): CHOL, HDL, LDLCALC, TRIG, CHOLHDL, LDLDIRECT in the last  72 hours. Thyroid Function Tests: No results for input(s): TSH, T4TOTAL, FREET4, T3FREE, THYROIDAB in the last 72 hours. Anemia Panel: No results for input(s): VITAMINB12, FOLATE, FERRITIN, TIBC, IRON, RETICCTPCT in the last 72 hours. Sepsis Labs: Recent Labs  Lab 12/25/20 0603 12/26/20 0521  PROCALCITON 1.26 0.66    Recent Results (from the past 240 hour(s))  Blood culture (routine x 2)     Status: None   Collection Time: 12/22/20  9:13 AM   Specimen: BLOOD  Result Value Ref Range Status   Specimen Description BLOOD LEFT ANTECUBITAL  Final   Special Requests   Final    BOTTLES DRAWN AEROBIC AND ANAEROBIC Blood Culture results may not be optimal due to an excessive volume of blood received in culture bottles   Culture   Final    NO GROWTH 5 DAYS Performed at [facility], [address] [place], [place] [address]    Report Status 12/27/2020 FINAL  Final  Blood culture (routine x 2)     Status: None   Collection Time: 12/22/20  9:13 AM   Specimen: BLOOD  Result Value Ref Range Status   Specimen Description BLOOD LEFT WRIST  Final   Special Requests   Final    BOTTLES DRAWN AEROBIC AND ANAEROBIC Blood Culture results may not be optimal due to an  excessive volume of blood received in culture bottles   Culture   Final    NO GROWTH 5 DAYS Performed at [facility], [address], [place], [place] [address]    Report Status 12/27/2020 FINAL  Final  SARS Coronavirus 2 by RT PCR (hospital order, performed in [place] hospital lab) Nasopharyngeal Nasopharyngeal Swab     Status: None   Collection Time: 12/22/20 12:05 PM   Specimen: Nasopharyngeal Swab  Result Value Ref Range Status   SARS Coronavirus 2 NEGATIVE NEGATIVE Final    Comment: (NOTE) SARS-CoV-2 target nucleic acids are NOT DETECTED.  The SARS-CoV-2 RNA is generally detectable in upper and lower respiratory specimens during the acute phase of infection. The lowest concentration of SARS-CoV-2 viral copies this assay can detect is 250 copies / mL. A negative result does not preclude SARS-CoV-2 infection and should not be used as the sole basis for treatment or other patient management decisions.  A negative result may occur with improper specimen collection / handling, submission of specimen other than nasopharyngeal swab, presence of viral mutation(s) within the areas targeted by this assay, and inadequate number of viral copies (<250 copies / mL). A negative result must be combined with clinical observations, patient history, and epidemiological information.  Fact Sheet for Patients:   StrictlyIdeas.no  Fact Sheet for Healthcare Providers: BankingDealers.co.za  This test is not yet approved or  cleared by the [place] FDA and has been authorized for detection and/or diagnosis of SARS-CoV-2 by FDA under an Emergency Use Authorization (EUA).  This EUA will remain in effect (meaning this test can be used) for the duration of the COVID-19 declaration under Section 564(b)(1) of the Act, 21 U.S.C. section 360bbb-3(b)(1), unless the authorization is terminated or revoked sooner.  Performed at [facility], [address] [place], [place] [address]   Surgical PCR screen     Status: None   Collection Time: 12/22/20  7:47 PM   Specimen: Nasal Mucosa; Nasal Swab  Result Value Ref Range Status   MRSA, PCR NEGATIVE NEGATIVE Final   Staphylococcus aureus NEGATIVE NEGATIVE Final    Comment: (NOTE) The  Xpert SA Assay (FDA approved for NASAL specimens in patients 87 years of age and older), is one component of a comprehensive surveillance program. It is not intended to diagnose infection nor to guide or monitor treatment. Performed at [facility], [address] [place], [place] [address]   Aerobic/Anaerobic Culture (surgical/deep wound)     Status: None (Preliminary result)   Collection Time: 12/26/20  8:20 AM   Specimen: PATH Other; Tissue  Result Value Ref Range Status   Specimen Description   Final    TOE Performed at [facility], [address], [place], [place] [address]    Special Requests   Final    NONE Performed at [facility], [place], [place] [address]    Gram Stain NO WBC SEEN NO ORGANISMS SEEN   Final   Culture   Final    NO GROWTH 3 DAYS NO ANAEROBES ISOLATED; CULTURE IN PROGRESS FOR 5 DAYS Performed at [facility] [address], [place], [place] [address]    Report Status PENDING  Incomplete         Radiology Studies: No results found.      Scheduled Meds: . apixaban  5 mg Oral BID  . aspirin EC  81 mg Oral Daily  . atorvastatin  10 mg Oral Daily  . buPROPion  150 mg Oral Daily  . Chlorhexidine Gluconate Cloth  6 each Topical Daily  . escitalopram  10 mg Oral Daily  . gabapentin  200 mg Oral TID  . levothyroxine  88 mcg Oral QAC breakfast  . mometasone-formoterol  2 puff Inhalation BID  . montelukast  10 mg Oral Daily  . nicotine  14 mg Transdermal Daily  . polyethylene glycol  17 g Oral Daily  . senna-docusate  1 tablet Oral BID  . traZODone  200 mg Oral QHS   Continuous  Infusions: .  ceFAZolin (ANCEF) IV 1 g (12/29/20 1246)     LOS: 7 days    Time spent: 25 minutes    Sidney Ace, MD [facility] Pager [phone]  If 7PM-7AM, please contact night-coverage 12/29/2020, 2:37 PM

## 2020-12-29 NOTE — Progress Notes (Signed)
Occupational Therapy Treatment Patient Details Name: [name] MRN: [id number] DOB: 09/10/1946 Today's Date: 12/29/2020    History of present illness [name] is a 51yoF who comes [facility] on 2/2 c negcrotic Rt 3rd toe, 3d AMS. 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 PMH: GAD, CAD, COPD, depression, DOE, GERD, hyperTSH. hypoTSH, hypomagnesemia, lumbar spinal stenosis.   OT comments  Pt seen for OT treatment on this date. Upon arrival to room pt awake, seated upright in bed with dining services at bedside. Pt A&O x 3, and agreeable to moving from bed to recliner for lunch. Pt was able to perform bed mobility with SUPERVISION and stand pivot transfer with MIN A for RW management. This session, pt able to don sock and surgical shoe, requiring only MIN A for managing surgical shoe straps. Pt is making good progress toward goals. Pt continues to benefit from skilled OT services to maximize return to PLOF and minimize risk of future falls, injury, caregiver burden, and readmission. Will continue to follow POC. Discharge recommendation remains appropriate.    Follow Up Recommendations  SNF    Equipment Recommendations  Other (comment) (defer to next venue of care)       Precautions / Restrictions Precautions Precautions: Fall Restrictions Weight Bearing Restrictions: Yes RLE Weight Bearing: Partial weight bearing RLE Partial Weight Bearing Percentage or Pounds: postop shoe with OOB mobility       Mobility Bed Mobility Overal bed mobility: Needs Assistance Bed Mobility: Supine to Sit;Sit to Supine     Supine to sit: Supervision Sit to supine: Supervision   General bed mobility comments: increased time and effort only  Transfers Overall transfer level: Needs assistance Equipment used: Rolling walker (2 wheeled) Transfers: Sit to/from [facility] Sit to Stand: Min guard Stand pivot transfers: Min assist        General transfer comment: MIN A for stand pivot tranfer with small shuffled steps d/t pt difficulty with RW management. Required verbal cues for hand placement on arm rests to ensure controlled descent    Balance Overall balance assessment: Needs assistance Sitting-balance support: No upper extremity supported;Feet supported Sitting balance-Leahy Scale: Good Sitting balance - Comments: Able to participate in LB dressing with improved sititing balance this date. Posterior lean noted, however able to readjust independently Postural control: Posterior lean Standing balance support: During functional activity;Bilateral upper extremity supported Standing balance-Leahy Scale: Poor Standing balance comment: during stand pivot transfer bed>chair. Required MIN A for RW management                           ADL either performed or assessed with clinical judgement   ADL Overall ADL's : Needs assistance/impaired                     Lower Body Dressing: Sitting/lateral leans;Minimal assistance Lower Body Dressing Details (indicate cue type and reason): Pt able to don sock and surgical shoe with supervision for posterior lean. Required MIN A for managing surgical shoe straps             Functional mobility during ADLs: Minimal assistance;Rolling walker General ADL Comments: MIN A for stand pivot transfer to chair     Vision Baseline Vision/History: Wears glasses Wears Glasses: At all times            Cognition Arousal/Alertness: Awake/alert Behavior During Therapy: [facility] for tasks assessed/performed Overall Cognitive Status: No family/caregiver  present to determine baseline cognitive functioning                                 General Comments: Oriented to self, place, and situation. Pt reports that she is usually not aware of the date. Increased alertness this session, however requires increased processing time                   Pertinent Vitals/  Pain       Pain Assessment: 0-10 Pain Score: 6  Pain Location: surgical foot Pain Descriptors / Indicators: Aching Pain Intervention(s): Limited activity within patient's tolerance;Monitored during session;Premedicated before session         Frequency  Min 1X/week        Progress Toward Goals  OT Goals(current goals can now be found in the care plan section)  Progress towards OT goals: Progressing toward goals  Acute Rehab OT Goals Patient Stated Goal: To go home OT Goal Formulation: With patient Time For Goal Achievement: 01/10/21 Potential to Achieve Goals: Good  Plan Discharge plan remains appropriate;Frequency remains appropriate       AM-PAC OT "6 Clicks" Daily Activity     Outcome Measure   Help from another person eating meals?: A Little Help from another person taking care of personal grooming?: A Little Help from another person toileting, which includes using toliet, bedpan, or urinal?: A Lot Help from another person bathing (including washing, rinsing, drying)?: A Lot Help from another person to put on and taking off regular upper body clothing?: A Little Help from another person to put on and taking off regular lower body clothing?: A Lot 6 Click Score: 15    End of Session Equipment Utilized During Treatment: Gait belt;Rolling walker  OT Visit Diagnosis: Unsteadiness on feet (R26.81);Muscle weakness (generalized) (M62.81)   Activity Tolerance Patient tolerated treatment well   Patient Left in chair;with call bell/phone within reach;with chair alarm set   Nurse Communication Mobility status        Time: 1220-1240 OT Time Calculation (min): 20 min  Charges: OT General Charges $OT Visit: 1 Visit OT Treatments $Self Care/Home Management : 8-22 mins   Fredirick Maudlin, OTR/L [place]

## 2020-12-29 NOTE — Progress Notes (Signed)
   12/29/20 1641  Clinical Encounter Type  Visited With Patient  Visit Type Initial;Social support;Spiritual support  Referral From Nurse  Consult/Referral To Chaplain   Nurse paged chaplain per request of the PT. [name] was inquiring about possibly doing an AD. Chaplain did AD education with PT. PT will get nurse to page chaplain, if she decides to complete an AD.

## 2020-12-29 NOTE — Progress Notes (Signed)
Physical Therapy Treatment Patient Details Name: [name] MRN: [id number] DOB: Sep 02, 1946 Today's Date: 12/29/2020    History of Present Illness Mackinzee Roszak is a 31yoF who comes to [facility] on 2/2 c negcrotic Rt 3rd toe, 3d AMS. 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 PMH: GAD, CAD, COPD, depression, DOE, GERD, hyperTSH. hypoTSH, hypomagnesemia, lumbar spinal stenosis.    PT Comments    Pt in bed upon entry on telephone, agreeable to session. Supervision bed mobility. Pt steady at EOB; totalA for don/doff postop shoe. Pt performs STS from EOB c RW x3, requires min-modA first two with delay in onset balance acquisition. Third rise to standing in much improved, no assist needed, balance established immediately. Pt fatigued at end of session, asleep before author returns with warm blanket.    Follow Up Recommendations  SNF;Supervision for mobility/OOB     Equipment Recommendations  Wheelchair (measurements PT);Wheelchair cushion (measurements PT)    Recommendations for Other Services       Precautions / Restrictions Precautions Precautions: Fall Restrictions RLE Weight Bearing: Partial weight bearing RLE Partial Weight Bearing Percentage or Pounds: postop shoe    Mobility  Bed Mobility Overal bed mobility: Needs Assistance Bed Mobility: Supine to Sit;Sit to Supine     Supine to sit: Supervision Sit to supine: Supervision      Transfers Overall transfer level: Needs assistance Equipment used: Rolling walker (2 wheeled) Transfers: Sit to/from Stand   Stand pivot transfers: Min guard       General transfer comment: PWB, post op shoe; 3x from EOB, minGuard for final one; first two with LOB  Ambulation/Gait                 Stairs             Wheelchair Mobility    Modified Rankin (Stroke Patients Only)       Balance Overall balance assessment: Needs assistance         Standing balance support:  During functional activity;Bilateral upper extremity supported Standing balance-Leahy Scale: Poor                              Cognition Arousal/Alertness: Awake/alert Behavior During Therapy: WFL for tasks assessed/performed Overall Cognitive Status: Difficult to assess                                 General Comments: pt interactive in a normal fashion but still does not recall author from previous 2 day' session. .      Exercises      General Comments        Pertinent Vitals/Pain Pain Assessment: 0-10 Pain Score: 7  Pain Location: surgical foot Pain Descriptors / Indicators: Aching Pain Intervention(s): Limited activity within patient's tolerance;Monitored during session;Premedicated before session;Patient requesting pain meds-RN notified    Home Living                      Prior Function            PT Goals (current goals can now be found in the care plan section) Acute Rehab PT Goals Patient Stated Goal: Pt wants to go home PT Goal Formulation: With patient Time For Goal Achievement: 01/10/21 Potential to Achieve Goals: Fair Progress towards PT goals: Progressing toward goals    Frequency  7X/week      PT Plan Current plan remains appropriate    Co-evaluation              AM-PAC PT "6 Clicks" Mobility   Outcome Measure  Help needed turning from your back to your side while in a flat bed without using bedrails?: A Little Help needed moving from lying on your back to sitting on the side of a flat bed without using bedrails?: A Little Help needed moving to and from a bed to a chair (including a wheelchair)?: A Lot Help needed standing up from a chair using your arms (e.g., wheelchair or bedside chair)?: A Lot Help needed to walk in hospital room?: Total Help needed climbing 3-5 steps with a railing? : Total 6 Click Score: 12    End of Session Equipment Utilized During Treatment: Oxygen;Gait belt Activity  Tolerance: Patient tolerated treatment well;Patient limited by fatigue Patient left: in bed;with call bell/phone within reach;with nursing/sitter in room Nurse Communication: Mobility status PT Visit Diagnosis: Difficulty in walking, not elsewhere classified (R26.2);Other abnormalities of gait and mobility (R26.89)     Time: 5852-7782 PT Time Calculation (min) (ACUTE ONLY): 19 min  Charges:  $Therapeutic Exercise: 8-22 mins                     12:52 PM, 12/29/20 Etta Grandchild, PT, DPT Physical Therapist - [facility]  [phone] ([place])      [place] 12/29/2020, 12:49 PM

## 2020-12-30 ENCOUNTER — Inpatient Hospital Stay: Payer: Medicare HMO

## 2020-12-30 DIAGNOSIS — M869 Osteomyelitis, unspecified: Secondary | ICD-10-CM | POA: Diagnosis not present

## 2020-12-30 LAB — MAGNESIUM: Magnesium: 1.5 mg/dL — ABNORMAL LOW (ref 1.7–2.4)

## 2020-12-30 LAB — PHOSPHORUS: Phosphorus: 3.4 mg/dL (ref 2.5–4.6)

## 2020-12-30 MED ORDER — MAGNESIUM SULFATE 4 GM/100ML IV SOLN
4.0000 g | Freq: Once | INTRAVENOUS | Status: AC
  Administered 2020-12-30: 4 g via INTRAVENOUS
  Filled 2020-12-30 (×2): qty 100

## 2020-12-30 NOTE — Progress Notes (Signed)
F/ u 3rd toe amputation.  Pt feeling much better.  Worried about 2nd toe  2nd toe is dusky at tip.  Will continue to monitor. 3rd toe amputation site healing nicely.  Dressing changed. Can leave intact if keeps clean and dry.  F/U with podiatry in 1 week.

## 2020-12-30 NOTE — TOC Progression Note (Signed)
Transition of Care [facility]) - Progression Note    Patient Details  Name: Laniah Grimm MRN: [id number] Date of Birth: 06/08/1946  [facility] CM/SW Contact  Beverly Sessions, RN Phone Number: 12/30/2020, 3:13 PM  Clinical Narrative:     [employer] has confirmed with family that there is a concentrator in the home.  When WC is delivered to the home they will service the concentrator, and determined if it needs to be switched out   Expected Discharge Plan: [place] Barriers to Discharge: Continued Medical Work up  Expected Discharge Plan and Services Expected Discharge Plan: [place]   Discharge Planning Services: CM Consult Post Acute Care Choice: [place] arrangements for the past 2 months: Single Family Home                 DME Arranged: Oxygen,Wheelchair manual DME Agency: [facility] Date DME Agency Contacted: 12/30/20   Representative spoke with at DME Agency: [place]: RN,PT,OT,Nurse's Aide [place]: [facility] [employer] [facility] [employer] Date [place]: 12/30/20   Representative spoke with at [place]: [place] ([place]) Interventions    Readmission Risk Interventions No flowsheet data found.

## 2020-12-30 NOTE — Progress Notes (Signed)
SATURATION QUALIFICATIONS: (This note is used to comply with regulatory documentation for home oxygen)  Patient Saturations on Room Air at Rest = 93%  Patient Saturations on Room Air while Ambulating = 82%  Patient Saturations on 2 Liters of oxygen while Ambulating = 96%  Please briefly explain why patient needs home oxygen: Pt's Oxygen is 82% with minimal activity.

## 2020-12-30 NOTE — TOC Initial Note (Signed)
Transition of Care [facility]) - Initial/Assessment Note    Patient Details  Name: [name] MRN: [id number] Date of Birth: 1946/01/27  [facility]) CM/SW Contact:    Beverly Sessions, RN Phone Number: 12/30/2020, 1:52 PM  Clinical Narrative:                 Patient admitted from home with osteomyelitis  Patient states that she lives at home with her dog and daughter in law  Has private caregiver named Kennyth Lose 3-4 hours a day 5 days a week  Patient states that here PCP [facility]-  Denies issues obtaining medications.   Patient states that she has a RW and cane in the home  PT has assessed patient and recommends SNF Patient is adamant about not going to SNF at discharge.  TOC discussed benefit and patient still wishes to go home.  Agreeable to home health services, states that she does not have a preference of agency.  Referral made to [facility].  Patient states that she does not have O2 at home.  Referral made to [facility] with [employer] for O2 and WC.  Patient will need EMS at discharge so I have requested that equipment be delivered to home.   [employer] is showing that patient already has O2 at home, Zack to follow up  Expected Discharge Plan: [place] Barriers to Discharge: Continued Medical Work up   Patient Goals and CMS Choice   CMS Medicare.gov Compare Post Acute Care list provided to:: Patient Choice offered to / list presented to : Patient  Expected Discharge Plan and Services Expected Discharge Plan: [place]   Discharge Planning Services: CM Consult Post Acute Care Choice: Home Health,Durable Medical Equipment Living arrangements for the past 2 months: Single Family Home                 DME Arranged: Oxygen,Wheelchair manual DME Agency: [facility] Date DME Agency Contacted: 12/30/20   Representative spoke with at DME Agency: [place]: RN,PT,OT,Nurse's Aide [place]: [facility] [facility] [employer] Date [place]: 12/30/20   Representative spoke with at [place]: Helene Kelp  Prior Living Arrangements/Services Living arrangements for the past 2 months: [place] with:: Pets,Relatives,Other (Comment) Patient language and need for interpreter reviewed:: Yes Do you feel safe going back to the place where you live?: Yes      Need for Family Participation in Patient Care: Yes (Comment) Care giver support system in place?: Yes (comment) Current home services: DME Criminal Activity/Legal Involvement Pertinent to Current Situation/Hospitalization: No - Comment as needed  Activities of Daily Living Home Assistive Devices/Equipment: Medical sales representative (specify type),Bedside commode/3-in-1 ADL Screening (condition at time of admission) Patient's cognitive ability adequate to safely complete daily activities?: Yes Is the patient deaf or have difficulty hearing?: Yes Does the patient have difficulty seeing, even when wearing glasses/contacts?: Yes Does the patient have difficulty concentrating, remembering, or making decisions?: No Patient able to express need for assistance with ADLs?: Yes Does the patient have difficulty dressing or bathing?: Yes Independently performs ADLs?: No Communication: Independent Dressing (OT): Needs assistance Is this a change from baseline?: Pre-admission baseline Grooming: Needs assistance Is this a change from baseline?: Pre-admission baseline Feeding: Independent Bathing: Needs assistance Is this a change from baseline?: Pre-admission baseline Toileting: Needs assistance Is this a change from baseline?: Pre-admission baseline In/Out Bed: Independent Walks in Home: Needs assistance Is this a  change from baseline?: Pre-admission baseline Does the patient have difficulty walking or climbing stairs?: Yes Weakness of Legs: Right Weakness of Arms/Hands: Both  Permission Sought/Granted                   Emotional Assessment Appearance:: Appears older than stated age     Orientation: : Oriented to Self,Oriented to Place,Oriented to  Time,Oriented to Situation   Psych Involvement: No (comment)  Admission diagnosis:  Leg pain [M79.606] Cellulitis of right foot [L03.115] Dry gangrene (HCC) [I96] 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 [facility]) [M86.9] Patient Active Problem List   Diagnosis Date Noted  . 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 ([place]) 12/22/2020  . PAD (peripheral artery disease) ([place]) 12/22/2020  . Encounter for long-term opiate analgesic use 11/11/2020  . Dry gangrene ([place]) of middle toe, right foot 11/11/2020  . Foot pain, right 11/11/2020  . Hard of hearing 11/11/2020  . Atherosclerosis of native arteries of the extremities with gangrene ([place]) 12/09/2019  . Vitamin D deficiency 11/12/2019  . Pharmacologic therapy 06/04/2019  . Disorder of skeletal system 06/04/2019  . Problems influencing health status 06/04/2019  . Sepsis ([place]) 10/22/2018  . HCAP (healthcare-associated pneumonia) 10/22/2018  . Compression fracture of L3 vertebra ([place]) 08/19/2018  . Acute UTI 08/18/2018  . Chronic hip pain (Bilateral) 12/31/2017  . Neurogenic pain 08/29/2017  . Chronic low back pain (1ry area of Pain) (Right) w/o sciatica 08/29/2017  . Chronic sacroiliac joint pain (Right) 06/05/2017  . Vitamin D insufficiency 03/12/2017  . Right hip pain 02/20/2017  . Intertrochanteric fracture of right hip, sequela 02/20/2017  . B12 deficiency 02/05/2017  . Pressure injury of skin 10/02/2016  . Closed displaced intertrochanteric fracture of right femur ([place]) 10/02/2016  . Hip fracture ([place]) 10/01/2016  . Cervical facet hypertrophy (Bilateral) 05/27/2016  . History of shoulder surgery 5 (Right) 05/27/2016  . Lumbar foraminal stenosis (L3-4) (Left) 05/27/2016  . 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 Lumbar facet hypertrophy (Bilateral) 05/27/2016  . Lumbar facet  syndrome (Right) 05/27/2016  . Lumbar grade 1 Anterolisthesis of L3 over L4 05/27/2016  . Chronic shoulder pain (Bilateral) (status post multiple surgeries) (R>L) 12/08/2015  . Substance use disorder Risk: High 09/14/2015  . Chronic pain syndrome 09/14/2015  . Cervical spondylosis 09/14/2015  . Chronic neck pain (2ry area of Pain) (Bilateral) (R>L) 09/14/2015  . 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 Cervical facet syndrome (Bilateral) (R>L) 09/14/2015  . Cervical myofascial pain syndrome 09/14/2015  . Lumbar spondylosis 09/14/2015  . Chronic shoulder impingement syndrome (Right) 09/14/2015  . Hypomagnesemia 09/14/2015  . CRP elevated 09/14/2015  . Elevated sedimentation rate 09/14/2015  . Chronic obstructive pulmonary disease (COPD) ([place]) 09/14/2015  . Nicotine dependence 09/14/2015  . Chronic shoulder pain (Right) 09/14/2015  . Abnormal nerve conduction studies 09/14/2015  . Encounter for therapeutic drug level monitoring 09/09/2015  . Long term current use of opiate analgesic 09/09/2015  . Long term prescription opiate use 09/09/2015  . Uncomplicated opioid dependence ([place]) 09/09/2015  . Opiate use 09/09/2015  . Anxiety and depression 07/11/2012  . Tobacco use disorder 05/27/2011  . Fatigue 05/27/2011  . Hypothyroidism 11/25/2010  . HLD (hyperlipidemia) 08/24/2010  . Tachycardia 08/24/2010  . DYSPNEA 08/24/2010   PCP:  Albina Billet, MD Pharmacy:   [place], [place] - [address] [place] [address] Phone: [phone] Fax: [phone]  Social Determinants of Health (SDOH) Interventions    Readmission Risk Interventions No flowsheet data found.

## 2020-12-30 NOTE — Progress Notes (Signed)
PROGRESS NOTE    [name]  EGB:[id number] DOB: 10/29/1946 DOA: 12/22/2020 PCP: Albina Billet, MD   Brief Narrative:  75 y.o.femalewith medical history significant forperipheral arterial disease status post recent mechanical thrombectomy of the right distal SFA and popliteal artery as well/PTCA of the right anterior tibial artery and stent angioplasty of the SFA,popliteal artery and right anterior tibial artery, history of COPD, hypertension who presents to the ER for evaluation of mental status changes as well as worsening pain in her right third toe. Patient had dry gangrene of the right third toe and over the last 1 week she has noticed increased redness, pain and swelling involving that toe. She rates her pain a 10 x 10 in intensity at its worst and has had to increase the frequency of administration of her pain medicines due to worsening pain. She now has redness that had extended up to her right ankle associated with chills but denies having any fever.  On 12/23/2020 she underwentAngiogram to the RLE with mechanical thrombectomy and catheter directed thrombolytic therapy to the Right SFA, popliteal artery, and anterior tibial artery. Required short ICU stay given use of thrombolytics and restlessness requiring Precedex infusion.  Precedex weaned off and patient was transferred to [facility] service on 12/28/2020.  Patient has improved substantially.  Seen by infectious disease and antibiotics discontinued as surgical margins were clear.  No further antibiotics will be necessary.  Had a lengthy conversation with the patient regarding strong recommendation for skilled nursing facility.  She continues to refuse and is adamant about going home.  I relayed this to the patient's daughter-in-law who is aware of patient's desire to go home however is concerned about her safety.  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) Active Problems:    Hypothyroidism   Tobacco use disorder   Anxiety and depression   Dry gangrene (HCC) of middle toe, right foot   PAD (peripheral artery disease) (HCC)  Gangrene of the Right Third Toe Severe Peripheral Vascular Disease S/p Angiogram of RLE on 12/23/20 with Mechanical thrombectomy and catheter directed thrombolytic therapy to the Right SFA, popliteal artery, and anterior tibial artery -Status post amputation of third toe of the right foot 2/6 -ID, podiatry and vascular following -No further inpatient management from podiatry -Antibiotics discontinued by ID -No further antibiotics necessary -Continue aspirin, Eliquis, statin -Patient will need close outpatient wound checks with podiatry -At this time refusing skilled nursing facility  History of COPD, without Acute Exacerbation Continue inhalers Continue Dulera and Singulair Encourage smoking cessation Supplemental oxygen to keep maintaining O2 sat 88 to 92% Patient qualified for home oxygen  Nicotine dependence Smoking cessation has been discussed with patient in detail Continue nicotine patch  Hypertension continue amlodipine  Depression and anxiety Continue on trazodone, Wellbutrin, Lexapro  Hypothyroidism Continue Synthroid   DVT prophylaxis: Eliquis Code Status: Full Family Communication: Daughter-in-law Ajwa Kimberley [phone] Disposition Plan: Status is: Inpatient  Remains inpatient appropriate because:Inpatient level of care appropriate due to severity of illness   Dispo: The patient is from: Home              Anticipated d/c is to: Home              Anticipated d/c date is: 1 day              Patient currently is not medically stable to d/c.   Difficult to place patient No   Patient  was strongly recommended skilled nursing facility/short-term rehab at time of discharge.  She is adamant that she wants to go home.  I lengthy conversation with the patient's daughter-in-law who is aware of the patient's  desires however fears for her safety at home.  We will revisit these issues with the patient.  Plan on discharge either SNF or home with home health on 12/31/2020           Level of care: Med-Surg  Consultants:   Podiatry  ID  Procedures:   Left third toe amputation  Antimicrobials:   Cefazolin   Subjective: Patient seen and examined.  Endorses pain in right toe.  Otherwise asymptomatic.  Objective: Vitals:   12/30/20 0427 12/30/20 0737 12/30/20 1257 12/30/20 1307  BP: 126/67 136/69 (!) 127/114 123/76  Pulse: 97 88 (!) 103 99  Resp: 16  (!) 22 20  Temp: 97.6 F (36.4 C) 98.3 F (36.8 C) 98 F (36.7 C) 98 F (36.7 C)  TempSrc: Oral Oral Axillary Axillary  SpO2: 96% 99% 100% 100%  Weight:      Height:        Intake/Output Summary (Last 24 hours) at 12/30/2020 1426 Last data filed at 12/30/2020 1021 Gross per 24 hour  Intake 480 ml  Output 2400 ml  Net -1920 ml   Filed Weights   12/23/20 0907 12/23/20 1232 12/27/20 1443  Weight: 59 kg 51.3 kg 57.4 kg    Examination:  General exam: No acute distress Respiratory system: Bibasilar crackles.  Normal work of breathing.  2 L Cardiovascular system: S1-S2, regular rate and rhythm, no murmurs, no pedal edema Gastrointestinal system: Nontender, nondistended, normal bowel sounds Central nervous system: Alert and oriented. No focal neurological deficits. Extremities: Decreased power bilateral lower extremities.  Status post left third toe amputation Skin: Status post left third toe amputation Psychiatry: Judgement and insight appear normal. Mood & affect appropriate.     Data Reviewed: I have personally reviewed following labs and imaging studies  CBC: Recent Labs  Lab 12/24/20 0455 12/24/20 1059 12/24/20 1557 12/24/20 2245 12/25/20 0603 12/26/20 0521 12/27/20 0541  WBC 7.9   < > 9.7 9.8 9.1 8.2 7.3  NEUTROABS 5.9  --   --   --   --   --   --   HGB 8.9*   < > 8.8* 7.9* 7.8* 7.2* 9.3*  HCT 27.9*    < > 27.5* 25.5* 25.1* 23.0* 28.9*  MCV 90.0   < > 90.8 91.4 90.0 89.8 90.3  PLT 321   < > 348 315 335 324 296   < > = values in this interval not displayed.   Basic Metabolic Panel: Recent Labs  Lab 12/24/20 0441 12/25/20 0603 12/26/20 0521 12/27/20 0541 12/28/20 0546 12/29/20 0514 12/30/20 0459  NA 137 139 140 142 140  --   --   K 3.6 3.4* 3.1* 3.6 3.7  --   --   CL 108 110 109 106 104  --   --   CO2 19* 21* 25 28 30   --   --   GLUCOSE 89 90 99 84 89  --   --   BUN 5* <5* <5* <5* <5*  --   --   CREATININE 0.46 0.58 0.64 0.51 0.53  --   --   CALCIUM 7.6* 7.5* 7.7* 7.7* 8.2*  --   --   MG 1.4* 2.1 1.5* 1.6* 1.6* 1.5* 1.5*  PHOS 2.5 2.3* 1.9* 2.2* 3.0 3.4 3.4  GFR: Estimated Creatinine Clearance: 54.1 mL/min (by C-G formula based on SCr of 0.53 mg/dL). Liver Function Tests: No results for input(s): AST, ALT, ALKPHOS, BILITOT, PROT, ALBUMIN in the last 168 hours. No results for input(s): LIPASE, AMYLASE in the last 168 hours. No results for input(s): AMMONIA in the last 168 hours. Coagulation Profile: No results for input(s): INR, PROTIME in the last 168 hours. Cardiac Enzymes: No results for input(s): CKTOTAL, CKMB, CKMBINDEX, TROPONINI in the last 168 hours. BNP (last 3 results) No results for input(s): PROBNP in the last 8760 hours. HbA1C: No results for input(s): HGBA1C in the last 72 hours. CBG: Recent Labs  Lab 12/26/20 2329 12/27/20 0537 12/27/20 0738 12/27/20 1122 12/27/20 1700  GLUCAP 94 81 89 128* 103*   Lipid Profile: No results for input(s): CHOL, HDL, LDLCALC, TRIG, CHOLHDL, LDLDIRECT in the last 72 hours. Thyroid Function Tests: No results for input(s): TSH, T4TOTAL, FREET4, T3FREE, THYROIDAB in the last 72 hours. Anemia Panel: No results for input(s): VITAMINB12, FOLATE, FERRITIN, TIBC, IRON, RETICCTPCT in the last 72 hours. Sepsis Labs: Recent Labs  Lab 12/25/20 0603 12/26/20 0521  PROCALCITON 1.26 0.66    Recent Results (from the past 240  hour(s))  Blood culture (routine x 2)     Status: None   Collection Time: 12/22/20  9:13 AM   Specimen: BLOOD  Result Value Ref Range Status   Specimen Description BLOOD LEFT ANTECUBITAL  Final   Special Requests   Final    BOTTLES DRAWN AEROBIC AND ANAEROBIC Blood Culture results may not be optimal due to an excessive volume of blood received in culture bottles   Culture   Final    NO GROWTH 5 DAYS Performed at [facility], [address] [place], [place] [address]    Report Status 12/27/2020 FINAL  Final  Blood culture (routine x 2)     Status: None   Collection Time: 12/22/20  9:13 AM   Specimen: BLOOD  Result Value Ref Range Status   Specimen Description BLOOD LEFT WRIST  Final   Special Requests   Final    BOTTLES DRAWN AEROBIC AND ANAEROBIC Blood Culture results may not be optimal due to an excessive volume of blood received in culture bottles   Culture   Final    NO GROWTH 5 DAYS Performed at [facility], [address] [place], [place] [address]    Report Status 12/27/2020 FINAL  Final  SARS Coronavirus 2 by RT PCR (hospital order, performed in [place] hospital lab) Nasopharyngeal Nasopharyngeal Swab     Status: None   Collection Time: 12/22/20 12:05 PM   Specimen: Nasopharyngeal Swab  Result Value Ref Range Status   SARS Coronavirus 2 NEGATIVE NEGATIVE Final    Comment: (NOTE) SARS-CoV-2 target nucleic acids are NOT DETECTED.  The SARS-CoV-2 RNA is generally detectable in upper and lower respiratory specimens during the acute phase of infection. The lowest concentration of SARS-CoV-2 viral copies this assay can detect is 250 copies / mL. A negative result does not preclude SARS-CoV-2 infection and should not be used as the sole basis for treatment or other patient management decisions.  A negative result may occur with improper specimen collection / handling, submission of specimen other than nasopharyngeal swab, presence of viral  mutation(s) within the areas targeted by this assay, and inadequate number of viral copies (<250 copies / mL). A negative result must be combined with clinical observations, patient history, and epidemiological information.  Fact Sheet for Patients:  StrictlyIdeas.no  Fact Sheet for Healthcare Providers: BankingDealers.co.za  This test is not yet approved or  cleared by the [place] FDA and has been authorized for detection and/or diagnosis of SARS-CoV-2 by FDA under an Emergency Use Authorization (EUA).  This EUA will remain in effect (meaning this test can be used) for the duration of the COVID-19 declaration under Section 564(b)(1) of the Act, 21 U.S.C. section 360bbb-3(b)(1), unless the authorization is terminated or revoked sooner.  Performed at [facility], [address] [place], [place] [address]   Surgical PCR screen     Status: None   Collection Time: 12/22/20  7:47 PM   Specimen: Nasal Mucosa; Nasal Swab  Result Value Ref Range Status   MRSA, PCR NEGATIVE NEGATIVE Final   Staphylococcus aureus NEGATIVE NEGATIVE Final    Comment: (NOTE) The Xpert SA Assay (FDA approved for NASAL specimens in patients 11 years of age and older), is one component of a comprehensive surveillance program. It is not intended to diagnose infection nor to guide or monitor treatment. Performed at [facility], [address], [place], [place] [address]   Aerobic/Anaerobic Culture (surgical/deep wound)     Status: None (Preliminary result)   Collection Time: 12/26/20  8:20 AM   Specimen: PATH Other; Tissue  Result Value Ref Range Status   Specimen Description   Final    TOE Performed at [facility], [address], [place], [place] [address]    Special Requests   Final    NONE Performed at [facility], [place], [place] [address]    Gram Stain NO WBC SEEN NO ORGANISMS SEEN    Final   Culture   Final    NO GROWTH 4 DAYS NO ANAEROBES ISOLATED; CULTURE IN PROGRESS FOR 5 DAYS Performed at [facility] [address] [place], [place] [address]    Report Status PENDING  Incomplete         Radiology Studies: DG Chest Port 1 View  Result Date: 12/30/2020 CLINICAL DATA:  Hypoxia and known osteomyelitis in the foot EXAM: PORTABLE CHEST 1 VIEW COMPARISON:  10/21/2018 FINDINGS: Cardiac shadow is stable. Aortic calcifications are again noted. Mild patchy basilar atelectasis is noted on the right. Postsurgical changes in the cervical spine are noted. IMPRESSION: Patchy right basilar atelectasis. Electronically Signed   By: Inez Catalina M.D.   On: 12/30/2020 12:51        Scheduled Meds: . apixaban  5 mg Oral BID  . aspirin EC  81 mg Oral Daily  . atorvastatin  10 mg Oral Daily  . buPROPion  150 mg Oral Daily  . Chlorhexidine Gluconate Cloth  6 each Topical Daily  . escitalopram  10 mg Oral Daily  . gabapentin  200 mg Oral TID  . levothyroxine  88 mcg Oral QAC breakfast  . mometasone-formoterol  2 puff Inhalation BID  . montelukast  10 mg Oral Daily  . nicotine  14 mg Transdermal Daily  . polyethylene glycol  17 g Oral Daily  . senna-docusate  1 tablet Oral BID  . traZODone  200 mg Oral QHS   Continuous Infusions: . magnesium sulfate bolus IVPB       LOS: 8 days    Time spent: 25 minutes    Sidney Ace, MD [facility] Pager [phone]  If 7PM-7AM, please contact night-coverage 12/30/2020, 2:26 PM

## 2020-12-30 NOTE — Progress Notes (Signed)
Initial meeting with patient this morning patient was sleeping. Staff called this evening when family member arrived. Patient awake and engaged in conversation. Patient is due to be released tomorrow. Advanced Directive is in physical chart at desk and needs to be notarized.

## 2020-12-30 NOTE — Progress Notes (Signed)
ID Pt with no complaints Hard of hearing Patient Vitals for the past 24 hrs:  BP Temp Temp src Pulse Resp SpO2  12/29/20 1951 132/72 98.4 F (36.9 C) - (!) 109 18 94 %  12/29/20 1608 124/82 98.8 F (37.1 C) Oral 96 20 93 %  12/29/20 1113 123/72 98 F (36.7 C) Oral 89 18 97 %  12/29/20 0419 136/75 97.6 F (36.4 C) Oral 86 20 99 %   Awake  Chest b/l air entry [id number] abd soft Rt foot- 3rd toe amputated- site clena with sutures well coapted- no erythema or discharge- 2nd toe looks dusky   Labs CBC Latest Ref Rng & Units 12/27/2020 12/26/2020 12/25/2020  WBC 4.0 - 10.5 K/uL 7.3 8.2 9.1  Hemoglobin 12.0 - 15.0 g/dL 9.3(L) 7.2(L) 7.8(L)  Hematocrit 36.0 - 46.0 % 28.9(L) 23.0(L) 25.1(L)  Platelets 150 - 400 K/uL 296 324 335    CMP Latest Ref Rng & Units 12/28/2020 12/27/2020 12/26/2020  Glucose 70 - 99 mg/dL 89 84 99  BUN 8 - 23 mg/dL <5(L) <5(L) <5(L)  Creatinine 0.44 - 1.00 mg/dL 0.53 0.51 0.64  Sodium 135 - 145 mmol/L 140 142 140  Potassium 3.5 - 5.1 mmol/L 3.7 3.6 3.1(L)  Chloride 98 - 111 mmol/L 104 106 109  CO2 22 - 32 mmol/L 30 28 25   Calcium 8.9 - 10.3 mg/dL 8.2(L) 7.7(L) 7.7(L)  Total Protein 6.5 - 8.1 g/dL - - -  Total Bilirubin 0.3 - 1.2 mg/dL - - -  Alkaline Phos 38 - 126 U/L - - -  AST 15 - 41 U/L - - -  ALT 0 - 44 U/L - - -   Micro Bone Culture no growth TOE, RIGHT THIRD; AMPUTATION:  - ACUTE OSTEOMYELITIS.  - GANGRENOUS NECROSIS AND ACUTE SUPPURATIVE INFLAMMATION, EXTENDING TO  SKIN AND SOFT TISSUE MARGIN.  - VIABLE BONY MARGIN, WITHOUT ACUTE INFLAMMATION  Impression/recommendation Dry gangrene rt 3rd toe- s/p amputation- culture no bacteria, bone proximal margin viable bone with no osteo Pt currently on Iv cefazolin- can stop the antibiotic as she does not need any more Watch out for 2nd toe as OP - as it is dusky and can progress to gangrene  PAD- s/p thrombectomy and TPA rt SFA  COPD Current smoker- talked about quitting to improve her circulation  HTN on  amlodipine Anxiety/depression on many meds  ID will sign off- call if needed

## 2020-12-30 NOTE — Progress Notes (Signed)
Physical Therapy Treatment Patient Details Name: [name] MRN: [id number] DOB: 06/26/1946 Today's Date: 12/30/2020    History of Present Illness [name] is a 23yoF who comes to [facility] on 2/2 c negcrotic Rt 3rd toe, 3d AMS. 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 PMH: GAD, CAD, COPD, depression, DOE, GERD, hyperTSH. hypoTSH, hypomagnesemia, lumbar spinal stenosis.    PT Comments    Pt was long sitting in bed upon arriving. She is HOH but able to follow commands. A and O x 4. On 2 L O2 [place] upon arriving. Attempted wean of O2 during session however quickly desaturates to 82% on rm air. Pt was able to exit R side of bed, stand, and ambulate however poor ability to maintain proper PWB. She needs max vcs and min assist 2 x to prevent fall. Lengthy discussion about DC disposition. Highly recommend SNF however pt is unwilling. She states she wants to go home and her daughter will assist her some there. Pt will need home O2. If pt is to DC home, HHPT recommended. Acute PT will continue to follow and progress as able per POC.    Follow Up Recommendations  SNF;Supervision/Assistance - 24 hour;Other (comment) (pt unwilling to go to STR. Wanting to DC home from acute hospital)     Equipment Recommendations  Wheelchair (measurements PT);Wheelchair cushion (measurements PT);Rolling walker with 5" wheels    Recommendations for Other Services       Precautions / Restrictions Precautions Precautions: Fall Restrictions Weight Bearing Restrictions: Yes RLE Weight Bearing: Partial weight bearing RLE Partial Weight Bearing Percentage or Pounds: post op shoe    Mobility  Bed Mobility Overal bed mobility: Needs Assistance Bed Mobility: Supine to Sit;Sit to Supine     Supine to sit: Supervision Sit to supine: Supervision        Transfers Overall transfer level: Needs assistance Equipment used: Rolling walker (2 wheeled) Transfers: Sit to/from  Stand Sit to Stand: Min guard         General transfer comment: Pt was able to stand with CGA for safety however has poor ability maintauining proper wt bearing.  Ambulation/Gait Ambulation/Gait assistance: Min assist Gait Distance (Feet): 20 Feet Assistive device: Rolling walker (2 wheeled) Gait Pattern/deviations: Step-to pattern Gait velocity: decreased   General Gait Details: Min assist + max vcs for safety. has two occasions of LOB with intervention to prevent fall. Attempted wean from 2 L o2 however once O2 removed, pt quickly destaurated to 82% with pt endorsing feeling lightheaded       Balance Overall balance assessment: Needs assistance Sitting-balance support: No upper extremity supported;Feet supported Sitting balance-Leahy Scale: Good     Standing balance support: During functional activity;Bilateral upper extremity supported Standing balance-Leahy Scale: Poor Standing balance comment: has two occasions of LOB during minimal gait distance         Cognition Arousal/Alertness: Awake/alert Behavior During Therapy: WFL for tasks assessed/performed Overall Cognitive Status: No family/caregiver present to determine baseline cognitive functioning      General Comments: Pt is severely HOH. Was cooperative throughout session but fatigues extremely quickly             Pertinent Vitals/Pain Pain Assessment: 0-10 Pain Score: 2  Pain Location: surgical foot Pain Descriptors / Indicators: Aching Pain Intervention(s): Limited activity within patient's tolerance;Monitored during session;Premedicated before session;Repositioned           PT Goals (current goals can now be found in the care plan  section) Acute Rehab PT Goals Patient Stated Goal: To go home Progress towards PT goals: Progressing toward goals    Frequency    7X/week      PT Plan Current plan remains appropriate       AM-PAC PT "6 Clicks" Mobility   Outcome Measure  Help needed  turning from your back to your side while in a flat bed without using bedrails?: A Little Help needed moving from lying on your back to sitting on the side of a flat bed without using bedrails?: A Little Help needed moving to and from a bed to a chair (including a wheelchair)?: A Lot Help needed standing up from a chair using your arms (e.g., wheelchair or bedside chair)?: A Lot Help needed to walk in hospital room?: A Little Help needed climbing 3-5 steps with a railing? : A Lot 6 Click Score: 15    End of Session Equipment Utilized During Treatment: Oxygen;Gait belt Activity Tolerance: Patient tolerated treatment well;Patient limited by fatigue Patient left: with nursing/sitter in room;in chair;with call bell/phone within reach;with chair alarm set Nurse Communication: Mobility status PT Visit Diagnosis: Difficulty in walking, not elsewhere classified (R26.2);Other abnormalities of gait and mobility (R26.89)     Time: 3419-6222 PT Time Calculation (min) (ACUTE ONLY): 24 min  Charges:  $Gait Training: 8-22 mins $Therapeutic Activity: 8-22 mins                     Julaine Fusi PTA 12/30/20, 1:18 PM

## 2020-12-31 DIAGNOSIS — M869 Osteomyelitis, unspecified: Secondary | ICD-10-CM | POA: Diagnosis not present

## 2020-12-31 LAB — MAGNESIUM: Magnesium: 2.1 mg/dL (ref 1.7–2.4)

## 2020-12-31 LAB — AEROBIC/ANAEROBIC CULTURE W GRAM STAIN (SURGICAL/DEEP WOUND)
Culture: NO GROWTH
Gram Stain: NONE SEEN

## 2020-12-31 LAB — PHOSPHORUS: Phosphorus: 3.9 mg/dL (ref 2.5–4.6)

## 2020-12-31 MED ORDER — FLUTICASONE-SALMETEROL 250-50 MCG/DOSE IN AEPB: 1.0000 | INHALATION_SPRAY | Freq: Two times a day (BID) | RESPIRATORY_TRACT | 0 refills | Status: DC

## 2020-12-31 MED ORDER — APIXABAN 5 MG PO TABS: 5.0000 mg | ORAL_TABLET | Freq: Two times a day (BID) | ORAL | 1 refills | Status: DC

## 2020-12-31 MED ORDER — NICOTINE 14 MG/24HR TD PT24: 14.0000 mg | MEDICATED_PATCH | Freq: Every day | TRANSDERMAL | 1 refills | Status: AC

## 2020-12-31 MED ORDER — OXYCODONE HCL 5 MG PO TABS: 5.0000 mg | ORAL_TABLET | Freq: Four times a day (QID) | ORAL | 0 refills | Status: DC | PRN

## 2020-12-31 NOTE — TOC Progression Note (Signed)
Transition of Care [facility]) - Progression Note    Patient Details  Name: [name] MRN: [id number] Date of Birth: 09/25/1946  Transition of Care [facility]) CM/SW Contact  Candie Chroman, LCSW Phone Number: 12/31/2020, 12:37 PM  Clinical Narrative:  [employer] representative checking status of DME delivery.   Expected Discharge Plan: [place] Barriers to Discharge: Continued Medical Work up  Expected Discharge Plan and Services Expected Discharge Plan: [place]   Discharge Planning Services: CM Consult Post Acute Care Choice: [place] arrangements for the past 2 months: Single Family Home                 DME Arranged: Oxygen,Wheelchair manual DME Agency: [employer] Date DME Agency Contacted: 12/30/20   Representative spoke with at DME Agency: [place]: RN,PT,OT,Nurse's Aide [place]: [facility] [employer] [facility] [employer] Date [place]: 12/30/20   Representative spoke with at [place]: [place] ([place]) Interventions    Readmission Risk Interventions No flowsheet data found.

## 2020-12-31 NOTE — TOC Progression Note (Signed)
Transition of Care [facility]) - Progression Note    Patient Details  Name: Dakiya Puopolo MRN: [id number] Date of Birth: [birth year]  Transition of Care [facility]) CM/SW [place], LCSW Phone Number: 12/31/2020, 2:14 PM  Clinical Narrative:   CSW informed Shawn that per [employer] Representative DME delivery is delayed but will be delivered today. Shawn agreed to call [place] when DME arrives so patient's RN can arrange transport home. Informed [employer] to call Shawn when they are en route to patient's house.     Expected Discharge Plan: [place] Barriers to Discharge: Continued Medical Work up  Expected Discharge Plan and Services Expected Discharge Plan: [place]   Discharge Planning Services: CM Consult Post Acute Care Choice: [place] arrangements for the past 2 months: Single Family Home Expected Discharge Date: 12/31/20               DME Arranged: Jearld Fenton manual DME Agency: [employer] Date DME Agency Contacted: 12/30/20   Representative spoke with at DME Agency: [place]: RN,PT,OT,Nurse's Aide [place]: [facility] [employer] [facility] [employer] Date [place]: 12/30/20   Representative spoke with at [place]: [place] ([place]) Interventions    Readmission Risk Interventions No flowsheet data found.

## 2020-12-31 NOTE — Progress Notes (Addendum)
   12/31/20 1111  Clinical Encounter Type  Visited With Patient  Visit Type Follow-up;Psychological support;Spiritual support;Social support  Referral From Chaplain  Consult/Referral To Nurse   Chaplain did a follow up to get an AD notarized. Chaplain also checked on PT, who was able to express her emotions. PT will request a chaplain again if needed.  Chaplain did an advance directive for patient, which is in the physical chart.

## 2020-12-31 NOTE — Progress Notes (Signed)
Discharge order received. Patient mental status is at baseline. Vital signs stable . No signs of acute distress. Discharge instructions given. Patient verbalized understanding. No other issues noted at this time.   

## 2020-12-31 NOTE — Progress Notes (Signed)
[employer] equipment not delivered to discharge destination to allow for safe discharge to home.  Discharge cancelled for this evening

## 2020-12-31 NOTE — Progress Notes (Signed)
Still awaiting call from [place] (relative) to let us know if equipment has been delivered.

## 2020-12-31 NOTE — Care Management Important Message (Signed)
Important Message  Patient Details  Name: [name] MRN: [id number] Date of Birth: [birth year]   Medicare Important Message Given:  Yes     Dannette Barbara 12/31/2020, 11:33 AM

## 2020-12-31 NOTE — Discharge Summary (Signed)
Physician Discharge Summary  Laaibah Wartman ESP:[id number] DOB: 03-Oct-1946 DOA: 12/22/2020  PCP: Albina Billet, MD  Admit date: 12/22/2020 Discharge date: 12/31/2020  Admitted From: Home Disposition: Home with home health  Recommendations for Outpatient Follow-up:  1. Follow-up with PCP in 1 week 2. Follow-up with podiatry in 1 week 3. Strongly suggest following up with pain management within 1 to 2 weeks  Home Health: Yes Equipment/Devices: Oxygen 2 L Discharge Condition: Stable CODE STATUS: Full Diet recommendation: Heart Healthy  Brief/Interim Summary:  75 y.o.femalewith medical history significant forperipheral arterial disease status post recent mechanical thrombectomy of the right distal SFA and popliteal artery as well/PTCA of the right anterior tibial artery and stent angioplasty of the SFA,popliteal artery and right anterior tibial artery, history of COPD, hypertension who presents to the ER for evaluation of mental status changes as well as worsening pain in her right third toe. Patient had dry gangrene of the right third toe and over the last 1 week she has noticed increased redness, pain and swelling involving that toe. She rates her pain a 10 x 10 in intensity at its worst and has had to increase the frequency of administration of her pain medicines due to worsening pain. She now has redness that had extended up to her right ankle associated with chills but denies having any fever.  On 12/23/2020 she underwentAngiogram to the RLE with mechanical thrombectomy and catheter directed thrombolytic therapy to the Right SFA, popliteal artery, and anterior tibial artery. Required short ICU stay given use of thrombolytics and restlessness requiring Precedex infusion.  Precedex weaned off and patient was transferred to [facility] service on 12/28/2020.  Patient has improved substantially.  Seen by infectious disease and antibiotics discontinued as surgical margins were clear.  No further  antibiotics will be necessary.  Had a lengthy conversation with the patient regarding strong recommendation for skilled nursing facility.  She continues to refuse and is adamant about going home.  I relayed this to the patient's daughter-in-law who is aware of patient's desire to go home however is concerned about her safety.  On day of discharge we had another long conversation about the dangers of potentially going home.  She relayed to me previous bad experiences with skilled nursing facility in the past and wishes to go home at this time.  I explained that we were trying to set up all the home health and home aide we possibly could.  She also had concerns about her poor level of pain control.  Again a long conversation no noted the patient has been on narcotics chronically.  She states she does see both a pain management doctor as well as her primary care doctor.  I explained that I could give her a small supply of oxycodone for pain relief at home but nothing stronger and would not be able to give any refills.  She would have to see her primary care or pain management doctor for refills if warranted.  Relayed discharge plan to podiatrist Dr. Vickki Muff who is aware of patient's refusal skilled nursing facility.  He will see patient in the office in 1 week.  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  I have refilled her Symbicort inhaler at time of discharge.  She will be prescribed home oxygen as well.  She will see her primary care physician within 1 to 2 weeks of discharge.  Nicotine dependence Smoking cessation has been discussed with patient in detail Continue nicotine patch Prescribed on discharge  Hypertension continue amlodipine  Depression and anxiety Continue on trazodone, Wellbutrin, Lexapro  Hypothyroidism Continue Synthroid  Discharge Instructions  Discharge Instructions    Diet - low sodium heart healthy   Complete by: As directed    Increase activity slowly   Complete by: As directed    No wound care   Complete by: As directed      Allergies as of 12/31/2020   No Known Allergies     Medication List    STOP taking these medications   clopidogrel 75 MG tablet Commonly known as: Plavix   HYDROcodone-acetaminophen 5-325 MG tablet Commonly known as: NORCO/VICODIN     TAKE these medications   ALPRAZolam 0.5 MG tablet Commonly known as: XANAX Take 0.5 mg by mouth every 6 (six) hours as needed for anxiety. What changed: Another medication with the same name was changed. Make sure you understand how and when to take each.   ALPRAZolam 1 MG tablet Commonly known as: XANAX Take 1 tablet (1 mg total) by mouth 4 (four) times daily as needed. What changed: when to take this   amLODipine 5 MG tablet Commonly known as: NORVASC Take 1 tablet (5 mg total) by mouth daily.   apixaban 5 MG Tabs tablet Commonly known as: ELIQUIS Take 1 tablet (5 mg total) by mouth 2 (two) times daily.   aspirin EC 81 MG  tablet Take 1 tablet (81 mg total) by mouth daily.   atorvastatin 10 MG tablet Commonly known as: Lipitor Take 1 tablet (10 mg total) by mouth daily.   buPROPion 150 MG 12 hr tablet Commonly known as: WELLBUTRIN SR Take 150 mg by mouth daily.   escitalopram 10 MG tablet Commonly known as: LEXAPRO Take 10 mg by mouth daily.   Fluticasone-Salmeterol 250-50 MCG/DOSE Aepb Commonly known as: Advair Diskus Inhale 1 puff into the lungs 2 (two) times daily.   levothyroxine 88 MCG tablet Commonly known as: SYNTHROID Take 88 mcg by mouth daily before breakfast.   montelukast 10 MG tablet Commonly known as: SINGULAIR Take 10 mg by mouth daily.   nicotine 14 mg/24hr patch Commonly known as: NICODERM CQ - dosed in mg/24 hours Place 1 patch (14 mg total) onto the skin daily.   oxyCODONE 5 MG immediate release tablet Commonly known as: Oxy IR/ROXICODONE Take 1-2 tablets (5-10 mg total) by mouth every 6 (six) hours as needed for up to 10 days for severe pain. What changed:   how much to take  when to take this  additional instructions  Another medication with the same name was removed. Continue taking this medication, and follow the directions you see here.  tizanidine 2 MG capsule Commonly known as: ZANAFLEX Take 1 capsule (2 mg total) by mouth 3 (three) times daily as needed for muscle spasms.   traZODone 100 MG tablet Commonly known as: DESYREL Take 1 tablet (100 mg total) by mouth at bedtime. What changed: how much to take            Durable Medical Equipment  (From admission, onward)         Start     Ordered   12/30/20 1333  For home use only DME lightweight manual wheelchair with seat cushion  Once       Comments: Patient suffers from Gangrene of the Right Third Toe s/p amputation Severe Peripheral Vascular Disease which impairs their ability to perform daily activities like ambulation in the home.  A rolling walker  will not resolve  issue with performing  activities of daily living. A wheelchair will allow patient to safely perform daily activities. Patient is not able to propel themselves in the home using a standard weight wheelchair due to weakness and pain. Patient can self propel in the lightweight wheelchair. Length of need lifetime  Accessories: elevating leg rests (ELRs), wheel locks, extensions and anti-tippers.   12/30/20 1334   12/30/20 1330  For home use only DME oxygen  Once       Question Answer Comment  Length of Need 12 Months   Mode or (Route) Nasal cannula   Liters per Minute 2   Frequency Continuous (stationary and portable oxygen unit needed)   Oxygen conserving device Yes   Oxygen delivery system Gas      12/30/20 1329          Follow-up Information    Dew, Erskine Squibb, MD Follow up in 1 month(s).   Specialties: Vascular Surgery, Radiology, Interventional Cardiology Why: Can see Dew or Arna Medici. Will need ABI with visit.  Contact information: [place] [address] [phone]        Albina Billet, MD. Schedule an appointment as soon as possible for a visit in 1 week(s).   Specialty: Internal Medicine Contact information: [address] [place] [address] [phone]        Samara Deist, [place]. Schedule an appointment as soon as possible for a visit in 1 week(s).   Specialty: Podiatry Contact information: [place] [address] [phone]              No Known Allergies  Consultations:  Podiatry  Infectious disease   Procedures/Studies: PERIPHERAL VASCULAR CATHETERIZATION  Result Date: 12/24/2020 See op note  PERIPHERAL VASCULAR CATHETERIZATION  Result Date: 12/23/2020 See op note  PERIPHERAL VASCULAR CATHETERIZATION  Result Date: 12/02/2020 See op note  US ARTERIAL ABI (SCREENING LOWER EXTREMITY)  Result Date: 12/22/2020 CLINICAL DATA:  75 year old female with necrotic right third toe, leg pain, claudication. EXAM: NONINVASIVE  PHYSIOLOGIC VASCULAR STUDY OF BILATERAL LOWER EXTREMITIES TECHNIQUE: Evaluation of both lower extremities were performed at rest, including calculation of ankle-brachial indices with single level Doppler, pressure and pulse volume recording. COMPARISON:  None. FINDINGS: Right ABI:  0.36 Left ABI:  0.95 Right Lower Extremity: Monophasic, dampened waveforms at the level of the ankle. Left Lower Extremity:  Normal arterial waveforms at the ankle. < 0.5 Severe PAD IMPRESSION: 1. Severe right lower extremity peripheral artery disease with right ABI = 0.36. 2. Normal left ankle brachial index. Ruthann Cancer, MD [facility] Radiology Electronically Signed   By:  Ruthann Cancer MD   On: 12/22/2020 16:35   DG Chest Port 1 View  Result Date: 12/30/2020 CLINICAL DATA:  Hypoxia and known osteomyelitis in the foot EXAM: PORTABLE CHEST 1 VIEW COMPARISON:  10/21/2018 FINDINGS: Cardiac shadow is stable. Aortic calcifications are again noted. Mild patchy basilar atelectasis is noted on the right. Postsurgical changes in the cervical spine are noted. IMPRESSION: Patchy right basilar atelectasis. Electronically Signed   By: Inez Catalina M.D.   On: 12/30/2020 12:51   DG Foot Complete Right  Result Date: 12/22/2020 CLINICAL DATA:  Necrotic right third toe.  Gangrene. EXAM: RIGHT FOOT COMPLETE - 3+ VIEW COMPARISON:  12/02/2019 FINDINGS: Erosive changes at the third PIP joint has developed since the prior study. D IP joint intact. No fracture. No other arthropathy. 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 Electronically Signed   By: Franchot Gallo M.D.   On: 12/22/2020 09:11   DG MINI C-ARM IMAGE ONLY  Result Date: 12/26/2020 There is no interpretation for this exam.  This order is for images obtained during a surgical procedure.  Please See "Surgeries" Tab for more information regarding the procedure.    (Echo, Carotid, EGD,  Colonoscopy, ERCP)    Subjective: Patient seen and examined the day of discharge.  Lengthy conversation regarding disposition plan.  Patient continues to endorse pain in the affected toe.  No other issues.  Discharge Exam: Vitals:   12/31/20 0750 12/31/20 1327  BP: 130/85 117/62  Pulse: 87 94  Resp: 20 16  Temp: 98.1 F (36.7 C) 98.6 F (37 C)  SpO2: 96% 96%   Vitals:   12/30/20 2349 12/31/20 0349 12/31/20 0750 12/31/20 1327  BP: (!) 153/75 137/79 130/85 117/62  Pulse: 94 86 87 94  Resp: 20 20 20 16   Temp: 97.7 F (36.5 C) 97.6 F (36.4 C) 98.1 F (36.7 C) 98.6 F (37 C)  TempSrc: Axillary Axillary Oral Oral  SpO2: 98% 98% 96% 96%  Weight:      Height:        General: Pt is alert, awake, not in acute distress Cardiovascular: RRR, S1/S2 +, no rubs, no gallops Respiratory: CTA bilaterally, no wheezing, no rhonchi Abdominal: Soft, NT, ND, bowel sounds + Extremities: Status post left third toe amputation.  Left second toe dusky    The results of significant diagnostics from this hospitalization (including imaging, microbiology, ancillary and laboratory) are listed below for reference.     Microbiology: Recent Results (from the past 240 hour(s))  Blood culture (routine x 2)     Status: None   Collection Time: 12/22/20  9:13 AM   Specimen: BLOOD  Result Value Ref Range Status   Specimen Description BLOOD LEFT ANTECUBITAL  Final   Special Requests   Final    BOTTLES DRAWN AEROBIC AND ANAEROBIC Blood Culture results may not be optimal due to an excessive volume of blood received in culture bottles   Culture   Final    NO GROWTH 5 DAYS Performed at [facility], [address] [place], [place] [address]    Report Status 12/27/2020 FINAL  Final  Blood culture (routine x 2)     Status: None   Collection Time: 12/22/20  9:13 AM   Specimen: BLOOD  Result Value Ref Range Status   Specimen Description BLOOD LEFT WRIST  Final   Special Requests   Final     BOTTLES DRAWN AEROBIC AND ANAEROBIC Blood Culture results may not  be optimal due to an excessive volume of blood received in culture bottles   Culture   Final    NO GROWTH 5 DAYS Performed at [facility], [address] [place], [place] [address]    Report Status 12/27/2020 FINAL  Final  SARS Coronavirus 2 by RT PCR (hospital order, performed in [facility] hospital lab) Nasopharyngeal Nasopharyngeal Swab     Status: None   Collection Time: 12/22/20 12:05 PM   Specimen: Nasopharyngeal Swab  Result Value Ref Range Status   SARS Coronavirus 2 NEGATIVE NEGATIVE Final    Comment: (NOTE) SARS-CoV-2 target nucleic acids are NOT DETECTED.  The SARS-CoV-2 RNA is generally detectable in upper and lower respiratory specimens during the acute phase of infection. The lowest concentration of SARS-CoV-2 viral copies this assay can detect is 250 copies / mL. A negative result does not preclude SARS-CoV-2 infection and should not be used as the sole basis for treatment or other patient management decisions.  A negative result may occur with improper specimen collection / handling, submission of specimen other than nasopharyngeal swab, presence of viral mutation(s) within the areas targeted by this assay, and inadequate number of viral copies (<250 copies / mL). A negative result must be combined with clinical observations, patient history, and epidemiological information.  Fact Sheet for Patients:   StrictlyIdeas.no  Fact Sheet for Healthcare Providers: BankingDealers.co.za  This test is not yet approved or  cleared by the [place] FDA and has been authorized for detection and/or diagnosis of SARS-CoV-2 by FDA under an Emergency Use Authorization (EUA).  This EUA will remain in effect (meaning this test can be used) for the duration of the COVID-19 declaration under Section 564(b)(1) of the Act, 21 U.S.C. section 360bbb-3(b)(1),  unless the authorization is terminated or revoked sooner.  Performed at [facility], [address], [place], [place] [address]   Surgical PCR screen     Status: None   Collection Time: 12/22/20  7:47 PM   Specimen: Nasal Mucosa; Nasal Swab  Result Value Ref Range Status   MRSA, PCR NEGATIVE NEGATIVE Final   Staphylococcus aureus NEGATIVE NEGATIVE Final    Comment: (NOTE) The Xpert SA Assay (FDA approved for NASAL specimens in patients 39 years of age and older), is one component of a comprehensive surveillance program. It is not intended to diagnose infection nor to guide or monitor treatment. Performed at [facility], [address] [place], [place] [address]   Aerobic/Anaerobic Culture (surgical/deep wound)     Status: None (Preliminary result)   Collection Time: 12/26/20  8:20 AM   Specimen: PATH Other; Tissue  Result Value Ref Range Status   Specimen Description   Final    TOE Performed at [facility], [address], [place], [place] [address]    Special Requests   Final    NONE Performed at [facility], [place], [place] [address]    Gram Stain NO WBC SEEN NO ORGANISMS SEEN   Final   Culture   Final    NO GROWTH 4 DAYS NO ANAEROBES ISOLATED; CULTURE IN PROGRESS FOR 5 DAYS Performed at [facility] [address], [place], [place] [address]    Report Status PENDING  Incomplete     Labs: BNP (last 3 results) No results for input(s): BNP in the last 8760 hours. Basic Metabolic Panel: Recent Labs  Lab 12/25/20 0603 12/26/20 5809 12/27/20 0541 12/28/20 0546 12/29/20 0514 12/30/20 0459 12/31/20  0414  NA 139 140 142 140  --   --   --   K 3.4* 3.1* 3.6 3.7  --   --   --   CL 110 109 106 104  --   --   --   CO2 21* 25 28 30   --   --   --   GLUCOSE 90 99 84 89  --   --   --   BUN <5* <5* <5* <5*  --   --   --   CREATININE 0.58 0.64 0.51 0.53  --   --   --   CALCIUM 7.5* 7.7* 7.7* 8.2*  --    --   --   MG 2.1 1.5* 1.6* 1.6* 1.5* 1.5* 2.1  PHOS 2.3* 1.9* 2.2* 3.0 3.4 3.4 3.9   Liver Function Tests: No results for input(s): AST, ALT, ALKPHOS, BILITOT, PROT, ALBUMIN in the last 168 hours. No results for input(s): LIPASE, AMYLASE in the last 168 hours. No results for input(s): AMMONIA in the last 168 hours. CBC: Recent Labs  Lab 12/24/20 1557 12/24/20 2245 12/25/20 0603 12/26/20 0521 12/27/20 0541  WBC 9.7 9.8 9.1 8.2 7.3  HGB 8.8* 7.9* 7.8* 7.2* 9.3*  HCT 27.5* 25.5* 25.1* 23.0* 28.9*  MCV 90.8 91.4 90.0 89.8 90.3  PLT 348 315 335 324 296   Cardiac Enzymes: No results for input(s): CKTOTAL, CKMB, CKMBINDEX, TROPONINI in the last 168 hours. BNP: Invalid input(s): POCBNP CBG: Recent Labs  Lab 12/26/20 2329 12/27/20 0537 12/27/20 0738 12/27/20 1122 12/27/20 1700  GLUCAP 94 81 89 128* 103*   D-Dimer No results for input(s): DDIMER in the last 72 hours. Hgb A1c No results for input(s): HGBA1C in the last 72 hours. Lipid Profile No results for input(s): CHOL, HDL, LDLCALC, TRIG, CHOLHDL, LDLDIRECT in the last 72 hours. Thyroid function studies No results for input(s): TSH, T4TOTAL, T3FREE, THYROIDAB in the last 72 hours.  Invalid input(s): FREET3 Anemia work up No results for input(s): VITAMINB12, FOLATE, FERRITIN, TIBC, IRON, RETICCTPCT in the last 72 hours. Urinalysis    Component Value Date/Time   COLORURINE YELLOW (A) 10/21/2018 2212   APPEARANCEUR CLEAR (A) 10/21/2018 2212   LABSPEC 1.029 10/21/2018 2212   PHURINE 6.0 10/21/2018 2212   GLUCOSEU NEGATIVE 10/21/2018 2212   HGBUR NEGATIVE 10/21/2018 2212   BILIRUBINUR NEGATIVE 10/21/2018 2212   KETONESUR 20 (A) 10/21/2018 2212   PROTEINUR NEGATIVE 10/21/2018 2212   NITRITE NEGATIVE 10/21/2018 2212   LEUKOCYTESUR NEGATIVE 10/21/2018 2212   Sepsis Labs Invalid input(s): PROCALCITONIN,  WBC,  LACTICIDVEN Microbiology Recent Results (from the past 240 hour(s))  Blood culture (routine x 2)      Status: None   Collection Time: 12/22/20  9:13 AM   Specimen: BLOOD  Result Value Ref Range Status   Specimen Description BLOOD LEFT ANTECUBITAL  Final   Special Requests   Final    BOTTLES DRAWN AEROBIC AND ANAEROBIC Blood Culture results may not be optimal due to an excessive volume of blood received in culture bottles   Culture   Final    NO GROWTH 5 DAYS Performed at [facility], [address], [place], [place] [address]    Report Status 12/27/2020 FINAL  Final  Blood culture (routine x 2)     Status: None   Collection Time: 12/22/20  9:13 AM   Specimen: BLOOD  Result Value Ref Range Status   Specimen Description BLOOD LEFT WRIST  Final   Special Requests  Final    BOTTLES DRAWN AEROBIC AND ANAEROBIC Blood Culture results may not be optimal due to an excessive volume of blood received in culture bottles   Culture   Final    NO GROWTH 5 DAYS Performed at [facility], [address] [place], [place] [address]    Report Status 12/27/2020 FINAL  Final  SARS Coronavirus 2 by RT PCR (hospital order, performed in [facility] hospital lab) Nasopharyngeal Nasopharyngeal Swab     Status: None   Collection Time: 12/22/20 12:05 PM   Specimen: Nasopharyngeal Swab  Result Value Ref Range Status   SARS Coronavirus 2 NEGATIVE NEGATIVE Final    Comment: (NOTE) SARS-CoV-2 target nucleic acids are NOT DETECTED.  The SARS-CoV-2 RNA is generally detectable in upper and lower respiratory specimens during the acute phase of infection. The lowest concentration of SARS-CoV-2 viral copies this assay can detect is 250 copies / mL. A negative result does not preclude SARS-CoV-2 infection and should not be used as the sole basis for treatment or other patient management decisions.  A negative result may occur with improper specimen collection / handling, submission of specimen other than nasopharyngeal swab, presence of viral mutation(s) within the areas targeted by this  assay, and inadequate number of viral copies (<250 copies / mL). A negative result must be combined with clinical observations, patient history, and epidemiological information.  Fact Sheet for Patients:   StrictlyIdeas.no  Fact Sheet for Healthcare Providers: BankingDealers.co.za  This test is not yet approved or  cleared by the [place] FDA and has been authorized for detection and/or diagnosis of SARS-CoV-2 by FDA under an Emergency Use Authorization (EUA).  This EUA will remain in effect (meaning this test can be used) for the duration of the COVID-19 declaration under Section 564(b)(1) of the Act, 21 U.S.C. section 360bbb-3(b)(1), unless the authorization is terminated or revoked sooner.  Performed at [facility], [address] [place], [place] [address]   Surgical PCR screen     Status: None   Collection Time: 12/22/20  7:47 PM   Specimen: Nasal Mucosa; Nasal Swab  Result Value Ref Range Status   MRSA, PCR NEGATIVE NEGATIVE Final   Staphylococcus aureus NEGATIVE NEGATIVE Final    Comment: (NOTE) The Xpert SA Assay (FDA approved for NASAL specimens in patients 8 years of age and older), is one component of a comprehensive surveillance program. It is not intended to diagnose infection nor to guide or monitor treatment. Performed at [facility], [address], [place], [place] [address]   Aerobic/Anaerobic Culture (surgical/deep wound)     Status: None (Preliminary result)   Collection Time: 12/26/20  8:20 AM   Specimen: PATH Other; Tissue  Result Value Ref Range Status   Specimen Description   Final    TOE Performed at [facility], [address], [place], [place] [address]    Special Requests   Final    NONE Performed at [facility], [place], [place] [address]    Gram Stain NO WBC SEEN NO ORGANISMS SEEN   Final   Culture   Final    NO GROWTH 4 DAYS  NO ANAEROBES ISOLATED; CULTURE IN PROGRESS FOR 5 DAYS Performed at [place] [address], [place], [place] [address]    Report Status PENDING  Incomplete     Time coordinating discharge: Over 30 minutes  SIGNED:   Sidney Ace, MD  [facility] 12/31/2020, 1:30 PM  Pager   If 7PM-7AM, please contact night-coverage

## 2020-12-31 NOTE — Discharge Instructions (Signed)
Can leave intact if keeps clean and dry.  F/U with podiatry in 1 week.

## 2020-12-31 NOTE — ACP (Advance Care Planning) (Signed)
Chaplain did a follow up to get an AD notarized. Chaplain also checked on PT, who was able to express her emotions. PT will request a chaplain again if needed.  Chaplain did an advance directive for patient, which is in the physical chart.

## 2020-12-31 NOTE — Progress Notes (Signed)
OT Cancellation Note  Patient Details Name: [name] MRN: [id number] DOB: [birth year]   Cancelled Treatment:    Reason Eval/Treat Not Completed: Other (comment) OT checked on Pt this AM and she was c/o pain and RN administered pain medication. Pt requests to hold session until pain medication has time to take effect. OT checks again on pt second time just before noon and pt already up to chair with PT and eating lunch at this time. Will f/u at later date/time as able to facilitate pt participation in occupational therapy.   Jake Church [name] 12/31/2020, 1:18 PM

## 2020-12-31 NOTE — TOC Transition Note (Signed)
Transition of Care [facility]) - CM/SW Discharge Note   Patient Details  Name: [name] MRN: [id number] Date of Birth: February 08, 1946  Transition of Care [facility]) CM/SW Contact:  Candie Chroman, LCSW Phone Number: 12/31/2020, 4:02 PM   Clinical Narrative: Patient has orders to discharge home today. RN will call EMS transport when Shawn calls to let her know that DME has been delivered. CSW confirmed with patient that address on facesheet is correct. [facility] is aware of discharge today. No further concerns. CSW signing off.    Final next level of care: Home w Home Health Services Barriers to Discharge: Barriers Resolved   Patient Goals and CMS Choice   CMS Medicare.gov Compare Post Acute Care list provided to:: Patient Choice offered to / list presented to : Patient  Discharge Placement                Patient to be transferred to facility by: EMS Name of family member notified: Lucia Bitter Patient and family notified of of transfer: 12/31/20  Discharge Plan and Services   Discharge Planning Services: CM Consult Post Acute Care Choice: Home Health,Durable Medical Equipment          DME Arranged: Oxygen,Wheelchair manual DME Agency: [employer] Date DME Agency Contacted: 12/30/20   Representative spoke with at DME Agency: [place]: RN,PT,OT,Nurse's Aide [place]: [facility] [employer] [facility] [employer] Date [place]: 12/30/20   Representative spoke with at [place]: [place] ([place]) Interventions     Readmission Risk Interventions No flowsheet data found.

## 2020-12-31 NOTE — Progress Notes (Signed)
At this point DME has not been delivered yet at pt's home. Awaiting call from [place] to let us know when equipment is delivered so EMS can be set  up.

## 2020-12-31 NOTE — Progress Notes (Signed)
Physical Therapy Treatment Patient Details Name: [name] MRN: [id number] DOB: 1946/07/26 Today's Date: 12/31/2020    History of Present Illness [name] is a 61yoF who comes to [facility] on 2/2 c negcrotic Rt 3rd toe, 3d AMS. 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 PMH: GAD, CAD, COPD, depression, DOE, GERD, hyperTSH. hypoTSH, hypomagnesemia, lumbar spinal stenosis.    PT Comments    Pt in bed upon entry, c/o continued 7/10 pain as previous days. Asked patient to demonstrate donning her postop shoe, she is unable to do so over 3 minutes, due to velcro interference. Author dons shoe for patient. No assist need to EOB. Bed soaked in urine, gown as well. Pt SPT EOB to recliner c RW, minGuard assist. Pt assisted with gown change and the wet-towel wipe down. RN made aware of purewick needs. Pt left up in chair at EOS, asked to sit up for ~2 hours or more.    Follow Up Recommendations  SNF;Supervision/Assistance - 24 hour;Other (comment) (pt refusing recommendations;)     Equipment Recommendations  Wheelchair (measurements PT);Wheelchair cushion (measurements PT);Rolling walker with 5" wheels    Recommendations for Other Services       Precautions / Restrictions Precautions Precautions: Fall Restrictions RLE Weight Bearing: Partial weight bearing    Mobility  Bed Mobility Overal bed mobility: Needs Assistance Bed Mobility: Supine to Sit     Supine to sit: Supervision     General bed mobility comments: moving a little faster today.    Transfers Overall transfer level: Needs assistance Equipment used: Rolling walker (2 wheeled) Transfers: Stand Pivot Transfers;Sit to/from Stand Sit to Stand: Min guard Stand pivot transfers: Min guard       General transfer comment: improved achievemnent of balance upon standing compared to 2 days prior  Ambulation/Gait Ambulation/Gait assistance:  (pivot to chair only, pt concerned about  pain at this time)               Stairs             Wheelchair Mobility    Modified Rankin (Stroke Patients Only)       Balance Overall balance assessment: Modified Independent;Mild deficits observed, not formally tested;History of Falls                                          Cognition Arousal/Alertness: Awake/alert Behavior During Therapy: WFL for tasks assessed/performed Overall Cognitive Status: No family/caregiver present to determine baseline cognitive functioning                                 General Comments: Pt is severely HOH. Was cooperative throughout session but fatigues extremely quickly      Exercises Other Exercises Other Exercises: donning of postop shoe: pt givens 3 minutes, able to reach foot without difficulty, but unable to achieve donning due to velcro interference and lack of maangement and problemsolving thereof.    General Comments        Pertinent Vitals/Pain Pain Assessment: 0-10 Pain Score: 7  Pain Location: surgical foot Pain Descriptors / Indicators: Aching Pain Intervention(s): Limited activity within patient's tolerance;Premedicated before session;Repositioned    Home Living  Prior Function            PT Goals (current goals can now be found in the care plan section) Acute Rehab PT Goals Patient Stated Goal: To go home PT Goal Formulation: With patient Time For Goal Achievement: 01/10/21 Potential to Achieve Goals: Fair Progress towards PT goals: Progressing toward goals    Frequency    7X/week      PT Plan Current plan remains appropriate    Co-evaluation              AM-PAC PT "6 Clicks" Mobility   Outcome Measure  Help needed turning from your back to your side while in a flat bed without using bedrails?: A Little Help needed moving from lying on your back to sitting on the side of a flat bed without using bedrails?: A Little Help  needed moving to and from a bed to a chair (including a wheelchair)?: A Lot Help needed standing up from a chair using your arms (e.g., wheelchair or bedside chair)?: A Lot Help needed to walk in hospital room?: A Little Help needed climbing 3-5 steps with a railing? : A Lot 6 Click Score: 15    End of Session Equipment Utilized During Treatment: Oxygen;Gait belt Activity Tolerance: Patient tolerated treatment well;Patient limited by fatigue Patient left: in chair;with call bell/phone within reach;with chair alarm set Nurse Communication: Mobility status PT Visit Diagnosis: Difficulty in walking, not elsewhere classified (R26.2);Other abnormalities of gait and mobility (R26.89)     Time: 5638-9373 PT Time Calculation (min) (ACUTE ONLY): 17 min  Charges:  $Therapeutic Exercise: 8-22 mins                     12:27 PM, 12/31/20 Etta Grandchild, PT, DPT Physical Therapist - [facility]  [phone] ([place])    [name] C 12/31/2020, 12:24 PM

## 2021-01-01 LAB — MAGNESIUM: Magnesium: 1.7 mg/dL (ref 1.7–2.4)

## 2021-01-01 LAB — PHOSPHORUS: Phosphorus: 4.3 mg/dL (ref 2.5–4.6)

## 2021-01-01 NOTE — TOC Transition Note (Addendum)
Transition of Care [facility]) - CM/SW Discharge Note   Patient Details  Name: [name] MRN: [id number] Date of Birth: 05/28/1946  Transition [facility] Care [facility]) CM/SW Contact:  Elliot Gurney [place], [place] Phone Number: 01/01/2021, 4:21 PM   Clinical Narrative:    Phone call from [facility] she is unable to accept patient for [facility] services at this time. [place], [facility], [facility], [facility] and [facility] have all declined patient for [facility] services as well. VM left for [facility]. Patient informed that [facility] may be an option, Malachy Mood from [employer] willing to check patient's insurance for anticipated co-pays if she is able to accept it and will contact patient on Monday. If [facility] is not able to take patient, patient informed that she will have to contact her physicians office for would care. TOC on call supervisor aware and will follow up on status of referral to [facility] on Monday, 01/03/21. Primary Care doctor-Dr. Hall Busing in [place].  5:11pm [facility] has accepted patient for PT and OT only. Patient to follow up with her Podiatrist in 1 week.   [place], LCSW Transitions of Care [phone]    Final next level of care: Home w Home Health Services Barriers to Discharge: Barriers Resolved   Patient Goals and CMS Choice   CMS Medicare.gov Compare Post Acute Care list provided to:: Patient Choice offered to / list presented to : Patient  Discharge Placement                Patient to be transferred to facility by: EMS Name of family member notified: Lucia Bitter Patient and family notified of of transfer: 12/31/20  Discharge Plan and Services   Discharge Planning Services: CM Consult Post Acute Care Choice: Home Health,Durable Medical Equipment          DME Arranged: Oxygen,Wheelchair manual DME Agency: [employer] Date DME Agency Contacted: 12/30/20   Representative spoke with at DME Agency: [place]: RN,PT,OT,Nurse's Aide [place]: [facility] [employer]  [facility] [employer] Date [place]: 12/30/20   Representative spoke with at [place]: [place] ([place]) Interventions     Readmission Risk Interventions No flowsheet data found.

## 2021-01-01 NOTE — TOC Transition Note (Signed)
Transition of Care [facility]) - CM/SW Discharge Note   Patient Details  Name: [name] MRN: [id number] Date of Birth: July 22, 1946  Transition of Care [facility]) CM/SW Contact:  Elliot Gurney [place], [place] Phone Number: 01/01/2021, 11:08 AM   Clinical Narrative:    Return phone call from [place] with [employer]ier Butt who states that the driver will be delivering the equipment today, however has no ETA at this time. Per [employer] they arrived this morning to patient's home and no one was there.  Confirmed with daughter in law that the plan has been to call when they are on the way and she should would meet them at the house as she is only 5 minutes a way from patient's home. Phone call made back to [employer], reminded them of this plan, contact provided for patient's daughter in law.   [place], LCSW Transition of Care [phone]    Final next level of care: Home w Home Health Services Barriers to Discharge: Barriers Resolved   Patient Goals and CMS Choice   CMS Medicare.gov Compare Post Acute Care list provided to:: Patient Choice offered to / list presented to : Patient  Discharge Placement                Patient to be transferred to facility by: EMS Name of family member notified: Lucia Bitter Patient and family notified of of transfer: 12/31/20  Discharge Plan and Services   Discharge Planning Services: CM Consult Post Acute Care Choice: Home Health,Durable Medical Equipment          DME Arranged: Oxygen,Wheelchair manual DME Agency: [employer] Date DME Agency Contacted: 12/30/20   Representative spoke with at DME Agency: [place]: RN,PT,OT,Nurse's Aide [place]: [facility] [employer] [facility] [employer] Date [place]: 12/30/20   Representative spoke with at [place]: [place] ([place]) Interventions     Readmission Risk Interventions No flowsheet data found.

## 2021-01-01 NOTE — Progress Notes (Signed)
PT Cancellation Note  Patient Details Name: Angelisse Riso MRN: [id number] DOB: 05-Feb-1946   Cancelled Treatment:     PT attempt. Pt refused. "I'm going home today." Attempted to encourage pt and discussed safety concerns. " I'm just ready to be home with my dog."   [place] Pa 01/01/2021, 4:13 PM

## 2021-01-01 NOTE — TOC Progression Note (Cosign Needed Addendum)
Transition of Care [facility]) - Progression Note    Patient Details  Name: [name] MRN: [id number] Date of Birth: [birth year]  [facility]) CM/SW Contact  Stanely Sexson, [place], [place] Phone Number: 01/01/2021, 10:41 AM  Clinical Narrative:    Phone call to [employer] to follow up on DME. DME has not been delivered, Frazier Butt is looking in to the logistics and will call this social worker back.  It was confirmed with patient and her daughter in law that equipment has not been delivered.   [address], LCSW Transition of Care [phone]    Expected Discharge Plan: [place] Barriers to Discharge: Barriers Resolved  Expected Discharge Plan and Services Expected Discharge Plan: [place]   Discharge Planning Services: CM Consult Post Acute Care Choice: [place] arrangements for the past 2 months: Single Family Home Expected Discharge Date: 01/01/21               DME Arranged: Oxygen,Wheelchair manual DME Agency: [employer] Date DME Agency Contacted: 12/30/20   Representative spoke with at DME Agency: [place]: RN,PT,OT,Nurse's Aide [place]: [facility] [employer] [facility] [employer] Date [place]: 12/30/20   Representative spoke with at [place]: [place] ([place]) Interventions    Readmission Risk Interventions No flowsheet data found.

## 2021-01-01 NOTE — Progress Notes (Signed)
Patient seen and examined this morning.  Remains medically stable for discharge however DME has not been delivered to the patient's house thus we do not have a safe disposition plan.  No changes in medical management at this time.  Patient remains medically stable for discharge and can DC home with home health services once confirmation the DME has been delivered.  Ralene Muskrat MD  No charge

## 2021-01-01 NOTE — Progress Notes (Signed)
Discharge instructions and med details reviewed with patient and Shawn (daugther in [place]). All questions answered. Pt wheeled outside and assisted into vehicle.  AVS given to Shawn.

## 2021-01-10 ENCOUNTER — Telehealth (INDEPENDENT_AMBULATORY_CARE_PROVIDER_SITE_OTHER): Payer: Self-pay

## 2021-01-11 ENCOUNTER — Other Ambulatory Visit: Payer: Self-pay

## 2021-01-11 ENCOUNTER — Inpatient Hospital Stay
Admission: EM | Admit: 2021-01-11 | Discharge: 2021-01-20 | DRG: 480 | Disposition: A | Discharge status: Skilled Nursing Facility | Payer: Medicare HMO | Attending: Internal Medicine | Admitting: Internal Medicine

## 2021-01-11 ENCOUNTER — Emergency Department: Payer: Medicare HMO

## 2021-01-11 ENCOUNTER — Encounter: Payer: Self-pay | Admitting: Emergency Medicine

## 2021-01-11 DIAGNOSIS — Z95820 Peripheral vascular angioplasty status with implants and grafts: Secondary | ICD-10-CM | POA: Diagnosis not present

## 2021-01-11 DIAGNOSIS — Z7982 Long term (current) use of aspirin: Secondary | ICD-10-CM

## 2021-01-11 DIAGNOSIS — I739 Peripheral vascular disease, unspecified: Secondary | ICD-10-CM | POA: Diagnosis present

## 2021-01-11 DIAGNOSIS — Z9049 Acquired absence of other specified parts of digestive tract: Secondary | ICD-10-CM | POA: Diagnosis not present

## 2021-01-11 DIAGNOSIS — Z79899 Other long term (current) drug therapy: Secondary | ICD-10-CM

## 2021-01-11 DIAGNOSIS — Z7951 Long term (current) use of inhaled steroids: Secondary | ICD-10-CM

## 2021-01-11 DIAGNOSIS — S72142A Displaced intertrochanteric fracture of left femur, initial encounter for closed fracture: Secondary | ICD-10-CM | POA: Diagnosis present

## 2021-01-11 DIAGNOSIS — W010XXA Fall on same level from slipping, tripping and stumbling without subsequent striking against object, initial encounter: Secondary | ICD-10-CM | POA: Diagnosis present

## 2021-01-11 DIAGNOSIS — S72002A Fracture of unspecified part of neck of left femur, initial encounter for closed fracture: Secondary | ICD-10-CM | POA: Diagnosis not present

## 2021-01-11 DIAGNOSIS — Z9842 Cataract extraction status, left eye: Secondary | ICD-10-CM

## 2021-01-11 DIAGNOSIS — S7292XA Unspecified fracture of left femur, initial encounter for closed fracture: Secondary | ICD-10-CM

## 2021-01-11 DIAGNOSIS — K219 Gastro-esophageal reflux disease without esophagitis: Secondary | ICD-10-CM | POA: Diagnosis present

## 2021-01-11 DIAGNOSIS — F419 Anxiety disorder, unspecified: Secondary | ICD-10-CM | POA: Diagnosis present

## 2021-01-11 DIAGNOSIS — H9190 Unspecified hearing loss, unspecified ear: Secondary | ICD-10-CM | POA: Diagnosis present

## 2021-01-11 DIAGNOSIS — Y92009 Unspecified place in unspecified non-institutional (private) residence as the place of occurrence of the external cause: Secondary | ICD-10-CM | POA: Diagnosis not present

## 2021-01-11 DIAGNOSIS — F17210 Nicotine dependence, cigarettes, uncomplicated: Secondary | ICD-10-CM | POA: Diagnosis present

## 2021-01-11 DIAGNOSIS — J690 Pneumonitis due to inhalation of food and vomit: Secondary | ICD-10-CM | POA: Diagnosis not present

## 2021-01-11 DIAGNOSIS — Z961 Presence of intraocular lens: Secondary | ICD-10-CM | POA: Diagnosis present

## 2021-01-11 DIAGNOSIS — J9611 Chronic respiratory failure with hypoxia: Secondary | ICD-10-CM | POA: Diagnosis present

## 2021-01-11 DIAGNOSIS — F32A Depression, unspecified: Secondary | ICD-10-CM | POA: Diagnosis present

## 2021-01-11 DIAGNOSIS — Z981 Arthrodesis status: Secondary | ICD-10-CM

## 2021-01-11 DIAGNOSIS — Z01811 Encounter for preprocedural respiratory examination: Secondary | ICD-10-CM

## 2021-01-11 DIAGNOSIS — Z89421 Acquired absence of other right toe(s): Secondary | ICD-10-CM | POA: Diagnosis not present

## 2021-01-11 DIAGNOSIS — I10 Essential (primary) hypertension: Secondary | ICD-10-CM | POA: Diagnosis present

## 2021-01-11 DIAGNOSIS — I2510 Atherosclerotic heart disease of native coronary artery without angina pectoris: Secondary | ICD-10-CM | POA: Diagnosis present

## 2021-01-11 DIAGNOSIS — I70261 Atherosclerosis of native arteries of extremities with gangrene, right leg: Secondary | ICD-10-CM | POA: Diagnosis present

## 2021-01-11 DIAGNOSIS — J440 Chronic obstructive pulmonary disease with acute lower respiratory infection: Secondary | ICD-10-CM | POA: Diagnosis not present

## 2021-01-11 DIAGNOSIS — Z79890 Hormone replacement therapy (postmenopausal): Secondary | ICD-10-CM

## 2021-01-11 DIAGNOSIS — Z8711 Personal history of peptic ulcer disease: Secondary | ICD-10-CM | POA: Diagnosis not present

## 2021-01-11 DIAGNOSIS — E876 Hypokalemia: Secondary | ICD-10-CM | POA: Diagnosis present

## 2021-01-11 DIAGNOSIS — E039 Hypothyroidism, unspecified: Secondary | ICD-10-CM | POA: Diagnosis present

## 2021-01-11 DIAGNOSIS — Z9841 Cataract extraction status, right eye: Secondary | ICD-10-CM

## 2021-01-11 DIAGNOSIS — S72002D Fracture of unspecified part of neck of left femur, subsequent encounter for closed fracture with routine healing: Secondary | ICD-10-CM | POA: Diagnosis not present

## 2021-01-11 DIAGNOSIS — D62 Acute posthemorrhagic anemia: Secondary | ICD-10-CM | POA: Diagnosis not present

## 2021-01-11 DIAGNOSIS — E785 Hyperlipidemia, unspecified: Secondary | ICD-10-CM | POA: Diagnosis present

## 2021-01-11 DIAGNOSIS — Z7901 Long term (current) use of anticoagulants: Secondary | ICD-10-CM

## 2021-01-11 DIAGNOSIS — E0590 Thyrotoxicosis, unspecified without thyrotoxic crisis or storm: Secondary | ICD-10-CM | POA: Diagnosis present

## 2021-01-11 DIAGNOSIS — S0990XA Unspecified injury of head, initial encounter: Secondary | ICD-10-CM | POA: Diagnosis present

## 2021-01-11 DIAGNOSIS — Z20822 Contact with and (suspected) exposure to covid-19: Secondary | ICD-10-CM | POA: Diagnosis present

## 2021-01-11 DIAGNOSIS — R000 Tachycardia, unspecified: Secondary | ICD-10-CM | POA: Diagnosis not present

## 2021-01-11 DIAGNOSIS — G894 Chronic pain syndrome: Secondary | ICD-10-CM

## 2021-01-11 DIAGNOSIS — I4891 Unspecified atrial fibrillation: Secondary | ICD-10-CM | POA: Diagnosis not present

## 2021-01-11 DIAGNOSIS — J449 Chronic obstructive pulmonary disease, unspecified: Secondary | ICD-10-CM | POA: Diagnosis present

## 2021-01-11 LAB — BASIC METABOLIC PANEL
Anion gap: 11 (ref 5–15)
BUN: 13 mg/dL (ref 8–23)
CO2: 25 mmol/L (ref 22–32)
Calcium: 8.6 mg/dL — ABNORMAL LOW (ref 8.9–10.3)
Chloride: 100 mmol/L (ref 98–111)
Creatinine, Ser: 0.62 mg/dL (ref 0.44–1.00)
GFR, Estimated: >60 mL/min (ref >60)
Glucose, Bld: 114 mg/dL — ABNORMAL HIGH (ref 70–99)
Potassium: 3.2 mmol/L — ABNORMAL LOW (ref 3.5–5.1)
Sodium: 136 mmol/L (ref 135–145)

## 2021-01-11 LAB — CBC WITH DIFFERENTIAL/PLATELET
Abs Immature Granulocytes: 0.07 10*3/uL (ref 0.00–0.07)
Basophils Absolute: 0.1 10*3/uL (ref 0.0–0.1)
Basophils Relative: 1 %
Eosinophils Absolute: 0 10*3/uL (ref 0.0–0.5)
Eosinophils Relative: 0 %
HCT: 34.9 % — ABNORMAL LOW (ref 36.0–46.0)
Hemoglobin: 11.4 g/dL — ABNORMAL LOW (ref 12.0–15.0)
Immature Granulocytes: 1 %
Lymphocytes Relative: 13 %
Lymphs Abs: 1.3 10*3/uL (ref 0.7–4.0)
MCH: 28.9 pg (ref 26.0–34.0)
MCHC: 32.7 g/dL (ref 30.0–36.0)
MCV: 88.6 fL (ref 80.0–100.0)
Monocytes Absolute: 0.9 10*3/uL (ref 0.1–1.0)
Monocytes Relative: 9 %
Neutro Abs: 7.3 10*3/uL (ref 1.7–7.7)
Neutrophils Relative %: 76 %
Platelets: 389 10*3/uL (ref 150–400)
RBC: 3.94 MIL/uL (ref 3.87–5.11)
RDW: 14.7 % (ref 11.5–15.5)
WBC: 9.5 10*3/uL (ref 4.0–10.5)
nRBC: 0 % (ref 0.0–0.2)

## 2021-01-11 LAB — RESP PANEL BY RT-PCR (FLU A&B, COVID) ARPGX2
Influenza A by PCR: NEGATIVE
Influenza B by PCR: NEGATIVE
SARS Coronavirus 2 by RT PCR: NEGATIVE

## 2021-01-11 LAB — PROTIME-INR
INR: 1.2 (ref 0.8–1.2)
Prothrombin Time: 14.7 seconds (ref 11.4–15.2)

## 2021-01-11 LAB — MAGNESIUM: Magnesium: 1.6 mg/dL — ABNORMAL LOW (ref 1.7–2.4)

## 2021-01-11 MED ORDER — ESCITALOPRAM OXALATE 10 MG PO TABS
10.0000 mg | ORAL_TABLET | Freq: Every day | ORAL | Status: DC
  Administered 2021-01-12 – 2021-01-20 (×8): 10 mg via ORAL
  Filled 2021-01-11 (×10): qty 1

## 2021-01-11 MED ORDER — MORPHINE SULFATE (PF) 4 MG/ML IV SOLN
4.0000 mg | Freq: Once | INTRAVENOUS | Status: AC
  Administered 2021-01-11: 4 mg via INTRAVENOUS
  Filled 2021-01-11: qty 1

## 2021-01-11 MED ORDER — MONTELUKAST SODIUM 10 MG PO TABS
10.0000 mg | ORAL_TABLET | Freq: Every day | ORAL | Status: DC
  Administered 2021-01-12 – 2021-01-19 (×8): 10 mg via ORAL
  Filled 2021-01-11 (×9): qty 1

## 2021-01-11 MED ORDER — HYDROCODONE-ACETAMINOPHEN 5-325 MG PO TABS
1.0000 | ORAL_TABLET | Freq: Four times a day (QID) | ORAL | Status: DC | PRN
  Administered 2021-01-11 – 2021-01-13 (×4): 2 via ORAL
  Administered 2021-01-13: 1 via ORAL
  Administered 2021-01-13: 2 via ORAL
  Administered 2021-01-13 – 2021-01-14 (×2): 1 via ORAL
  Administered 2021-01-15 – 2021-01-17 (×8): 2 via ORAL
  Administered 2021-01-17: 1 via ORAL
  Administered 2021-01-17 – 2021-01-20 (×10): 2 via ORAL
  Filled 2021-01-11 (×2): qty 2
  Filled 2021-01-11: qty 1
  Filled 2021-01-11 (×8): qty 2
  Filled 2021-01-11: qty 1
  Filled 2021-01-11 (×16): qty 2
  Filled 2021-01-11: qty 1

## 2021-01-11 MED ORDER — POTASSIUM CHLORIDE 20 MEQ PO PACK
60.0000 meq | PACK | Freq: Once | ORAL | Status: AC
  Administered 2021-01-12: 60 meq via ORAL
  Filled 2021-01-11: qty 3

## 2021-01-11 MED ORDER — MOMETASONE FURO-FORMOTEROL FUM 200-5 MCG/ACT IN AERO
2.0000 | INHALATION_SPRAY | Freq: Two times a day (BID) | RESPIRATORY_TRACT | Status: DC
  Administered 2021-01-12 – 2021-01-20 (×16): 2 via RESPIRATORY_TRACT
  Filled 2021-01-11 (×2): qty 8.8

## 2021-01-11 MED ORDER — SENNOSIDES-DOCUSATE SODIUM 8.6-50 MG PO TABS
1.0000 | ORAL_TABLET | Freq: Every evening | ORAL | Status: DC | PRN
  Administered 2021-01-13: 1 via ORAL
  Filled 2021-01-11: qty 1

## 2021-01-11 MED ORDER — MAGNESIUM SULFATE 2 GM/50ML IV SOLN
2.0000 g | Freq: Once | INTRAVENOUS | Status: AC
  Administered 2021-01-11: 2 g via INTRAVENOUS
  Filled 2021-01-11: qty 50

## 2021-01-11 MED ORDER — ATORVASTATIN CALCIUM 10 MG PO TABS
10.0000 mg | ORAL_TABLET | Freq: Every day | ORAL | Status: DC
  Administered 2021-01-12 – 2021-01-20 (×8): 10 mg via ORAL
  Filled 2021-01-11 (×8): qty 1

## 2021-01-11 MED ORDER — TRAZODONE HCL 50 MG PO TABS
100.0000 mg | ORAL_TABLET | Freq: Every day | ORAL | Status: DC
  Administered 2021-01-11 – 2021-01-19 (×9): 100 mg via ORAL
  Filled 2021-01-11 (×9): qty 1

## 2021-01-11 MED ORDER — ONDANSETRON HCL 4 MG/2ML IJ SOLN
4.0000 mg | Freq: Once | INTRAMUSCULAR | Status: AC
  Administered 2021-01-11: 4 mg via INTRAVENOUS
  Filled 2021-01-11: qty 2

## 2021-01-11 MED ORDER — ALBUTEROL SULFATE HFA 108 (90 BASE) MCG/ACT IN AERS
1.0000 | INHALATION_SPRAY | Freq: Four times a day (QID) | RESPIRATORY_TRACT | Status: DC | PRN
  Administered 2021-01-17 – 2021-01-18 (×4): 2 via RESPIRATORY_TRACT
  Filled 2021-01-11 (×2): qty 6.7

## 2021-01-11 MED ORDER — BUPROPION HCL ER (SR) 150 MG PO TB12
150.0000 mg | ORAL_TABLET | Freq: Every day | ORAL | Status: DC
  Administered 2021-01-12 – 2021-01-20 (×8): 150 mg via ORAL
  Filled 2021-01-11 (×9): qty 1

## 2021-01-11 MED ORDER — MORPHINE SULFATE (PF) 2 MG/ML IV SOLN
1.0000 mg | INTRAVENOUS | Status: DC | PRN
  Administered 2021-01-11 – 2021-01-14 (×10): 1 mg via INTRAVENOUS
  Filled 2021-01-11 (×12): qty 1

## 2021-01-11 MED ORDER — LEVOTHYROXINE SODIUM 88 MCG PO TABS
88.0000 ug | ORAL_TABLET | Freq: Every day | ORAL | Status: DC
  Administered 2021-01-12 – 2021-01-20 (×8): 88 ug via ORAL
  Filled 2021-01-11 (×10): qty 1

## 2021-01-11 MED ORDER — AMLODIPINE BESYLATE 5 MG PO TABS
5.0000 mg | ORAL_TABLET | Freq: Every day | ORAL | Status: DC
  Administered 2021-01-12 – 2021-01-20 (×7): 5 mg via ORAL
  Filled 2021-01-11 (×8): qty 1

## 2021-01-11 NOTE — ED Notes (Signed)
Patient transported to X-ray 

## 2021-01-11 NOTE — ED Provider Notes (Signed)
[facility] Emergency Department Provider Note ____________________________________________   Event Date/Time   First MD Initiated Contact with Patient 01/11/21 1846     (approximate)  I have reviewed the triage vital signs and the nursing notes.   HISTORY  Chief Complaint Fall and Hip Pain    HPI Tiah Heckel is a 75 y.o. female with PMH as noted below who presents with left hip pain after mechanical fall from standing height.  The patient has a dressing on her right foot after a recent surgery there and states that when she was getting up she slipped on a smooth floor and fell onto the left hip.  She denies hitting her head and had no LOC.  She denies other injuries.  Past Medical History:  Diagnosis Date  . Anxiety   . CAD (coronary artery disease)   . Carpal tunnel syndrome   . Cataract   . Complication of anesthesia    has woken up at times  . COPD (chronic obstructive pulmonary disease) ([place])   . CRP elevated 09/14/2015  . Depression   . Difficult intubation   . Dyslipidemia   . Dyspnea    DOE  . Dysrhythmia    hx palpatations  . Elevated sedimentation rate 09/14/2015  . Esophageal spasm   . Gastrointestinal parasites   . GERD (gastroesophageal reflux disease)   . Hiatal hernia   . History of peptic ulcer disease   . Hyperthyroidism   . Hypothyroidism   . Low magnesium levels 09/14/2015  . Pelvic fracture ([place]) 2008   fall from riding a horse  . Reflux   . Rotator cuff injury   . Stenosis, spinal, lumbar     Patient Active Problem List   Diagnosis Date Noted  . Fracture of femoral neck, left, closed ([place]) 01/11/2021  . 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 ([place]) 12/22/2020  . PAD (peripheral artery disease) ([place]) 12/22/2020  . Encounter for long-term opiate analgesic use 11/11/2020  . Dry gangrene ([place]) of middle toe, right foot 11/11/2020  . Foot pain, right 11/11/2020  . Hard of hearing 11/11/2020  . Atherosclerosis of  native arteries of the extremities with gangrene ([place]) 12/09/2019  . Vitamin D deficiency 11/12/2019  . Pharmacologic therapy 06/04/2019  . Disorder of skeletal system 06/04/2019  . Problems influencing health status 06/04/2019  . Sepsis ([place]) 10/22/2018  . HCAP (healthcare-associated pneumonia) 10/22/2018  . Compression fracture of L3 vertebra ([place]) 08/19/2018  . Acute UTI 08/18/2018  . Chronic hip pain (Bilateral) 12/31/2017  . Neurogenic pain 08/29/2017  . Chronic low back pain (1ry area of Pain) (Right) w/o sciatica 08/29/2017  . Chronic sacroiliac joint pain (Right) 06/05/2017  . Vitamin D insufficiency 03/12/2017  . Right hip pain 02/20/2017  . Intertrochanteric fracture of right hip, sequela 02/20/2017  . B12 deficiency 02/05/2017  . Pressure injury of skin 10/02/2016  . Closed displaced intertrochanteric fracture of right femur ([place]) 10/02/2016  . Hip fracture ([place]) 10/01/2016  . Cervical facet hypertrophy (Bilateral) 05/27/2016  . History of shoulder surgery 5 (Right) 05/27/2016  . Lumbar foraminal stenosis (L3-4) (Left) 05/27/2016  . 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 Lumbar facet hypertrophy (Bilateral) 05/27/2016  . Lumbar facet syndrome (Right) 05/27/2016  . Lumbar grade 1 Anterolisthesis of L3 over L4 05/27/2016  . Chronic shoulder pain (Bilateral) (status post multiple surgeries) (R>L) 12/08/2015  . Substance use disorder Risk: High 09/14/2015  . Chronic pain syndrome 09/14/2015  .  Cervical spondylosis 09/14/2015  . Chronic neck pain (2ry area of Pain) (Bilateral) (R>L) 09/14/2015  . 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 Cervical facet syndrome (Bilateral) (R>L) 09/14/2015  . Cervical myofascial pain syndrome 09/14/2015  . Lumbar spondylosis 09/14/2015  . Chronic shoulder impingement syndrome (Right) 09/14/2015  . Hypomagnesemia 09/14/2015  . CRP elevated 09/14/2015  . Elevated  sedimentation rate 09/14/2015  . Chronic obstructive pulmonary disease (COPD) ([place]) 09/14/2015  . Nicotine dependence 09/14/2015  . Chronic shoulder pain (Right) 09/14/2015  . Abnormal nerve conduction studies 09/14/2015  . Encounter for therapeutic drug level monitoring 09/09/2015  . Long term current use of opiate analgesic 09/09/2015  . Long term prescription opiate use 09/09/2015  . Uncomplicated opioid dependence ([place]) 09/09/2015  . Opiate use 09/09/2015  . Anxiety and depression 07/11/2012  . Tobacco use disorder 05/27/2011  . Fatigue 05/27/2011  . Hypothyroidism 11/25/2010  . HLD (hyperlipidemia) 08/24/2010  . Tachycardia 08/24/2010  . DYSPNEA 08/24/2010    Past Surgical History:  Procedure Laterality Date  . AMPUTATION TOE Right 12/26/2020   Procedure: AMPUTATION TOE-3rd Toes;  Surgeon: Samara Deist, DPM;  Location: [facility] ORS;  Service: Podiatry;  Laterality: Right;  . APPENDECTOMY    . BACK SURGERY     CERVICAL FUSION  . CARPAL TUNNEL RELEASE    . CATARACT EXTRACTION W/PHACO Right 08/07/2017   Procedure: CATARACT EXTRACTION PHACO AND INTRAOCULAR LENS PLACEMENT (IOC);  Surgeon: Birder Robson, MD;  Location: [facility];  Service: Ophthalmology;  Laterality: Right;  [place] 00:52.0 AP% 16.8 CDE 8.74 Fluid Pack Lot # [id number]  . CATARACT EXTRACTION W/PHACO Left 09/04/2017   Procedure: CATARACT EXTRACTION PHACO AND INTRAOCULAR LENS PLACEMENT (IOC);  Surgeon: Birder Robson, MD;  Location: [facility];  Service: Ophthalmology;  Laterality: Left;  [place] 00:34 AP% 17.0 CDE 5.80 Fluid pack lot # [id number]  . CESAREAN SECTION    . CHOLECYSTECTOMY    . FRACTURE SURGERY    . HIP SURGERY    . INTRAMEDULLARY (IM) NAIL INTERTROCHANTERIC Right 10/01/2016   Procedure: INTRAMEDULLARY (IM) NAIL INTERTROCHANTRIC;  Surgeon: Corky Mull, MD;  Location: [facility];  Service: Orthopedics;  Laterality: Right;  . KYPHOPLASTY N/A 08/20/2018   Procedure: [id number];  Surgeon: Hessie Knows, MD;   Location: [facility];  Service: Orthopedics;  Laterality: N/A;  . LOWER EXTREMITY ANGIOGRAPHY Right 12/18/2019   Procedure: LOWER EXTREMITY ANGIOGRAPHY;  Surgeon: Algernon Huxley, MD;  Location: [place] [facility];  Service: Cardiovascular;  Laterality: Right;  . LOWER EXTREMITY ANGIOGRAPHY Right 12/02/2020   Procedure: LOWER EXTREMITY ANGIOGRAPHY;  Surgeon: Algernon Huxley, MD;  Location: [place] [facility];  Service: Cardiovascular;  Laterality: Right;  . LOWER EXTREMITY ANGIOGRAPHY Right 12/23/2020   Procedure: Lower Extremity Angiography;  Surgeon: Algernon Huxley, MD;  Location: [place] [facility];  Service: Cardiovascular;  Laterality: Right;  . LOWER EXTREMITY ANGIOGRAPHY Right 12/24/2020   Procedure: Lower Extremity Angiography;  Surgeon: Algernon Huxley, MD;  Location: [place] [facility];  Service: Cardiovascular;  Laterality: Right;  . NECK SURGERY    . NOSE SURGERY    . ROTATOR CUFF REPAIR     x2  . SHOULDER ARTHROSCOPY  12/07/2011   Procedure: ARTHROSCOPY SHOULDER;  Surgeon: Ninetta Lights, MD;  Location: [place];  Service: Orthopedics;  Laterality: Right;  Debridement Partial Cuff Tear, Release Coracoacromial Ligament  . SHOULDER SURGERY  12/07/2011   right    Prior to Admission medications  Medication Sig Start Date End Date Taking? Authorizing Provider  ALPRAZolam Duanne Moron) 0.5 MG tablet Take 0.5 mg by mouth every 6 (six) hours as needed for anxiety.    [provider]  ALPRAZolam Duanne Moron) 1 MG tablet Take 1 tablet (1 mg total) by mouth 4 (four) times daily as needed. Patient taking differently: Take 1 mg by mouth 2 (two) times daily as needed. 10/04/16   Epifanio Lesches, MD  amLODipine (NORVASC) 5 MG tablet Take 1 tablet (5 mg total) by mouth daily. 08/22/18   Gladstone Lighter, MD  apixaban (ELIQUIS) 5 MG TABS tablet Take 1 tablet (5 mg total) by mouth 2 (two) times daily. 12/31/20 03/01/21  Sidney Ace, MD  aspirin EC 81 MG tablet Take 1 tablet (81  mg total) by mouth daily. 12/02/20   Algernon Huxley, MD  atorvastatin (LIPITOR) 10 MG tablet Take 1 tablet (10 mg total) by mouth daily. 12/18/19 12/17/20  Algernon Huxley, MD  buPROPion (WELLBUTRIN SR) 150 MG 12 hr tablet Take 150 mg by mouth daily.     [provider]  escitalopram (LEXAPRO) 10 MG tablet Take 10 mg by mouth daily.  10/15/13   [provider]  Fluticasone-Salmeterol (ADVAIR DISKUS) 250-50 MCG/DOSE AEPB Inhale 1 puff into the lungs 2 (two) times daily. 12/31/20 01/30/21  Sidney Ace, MD  levothyroxine (SYNTHROID, LEVOTHROID) 88 MCG tablet Take 88 mcg by mouth daily before breakfast.     [provider]  montelukast (SINGULAIR) 10 MG tablet Take 10 mg by mouth daily.    [provider]  nicotine (NICODERM CQ - DOSED IN MG/24 HOURS) 14 mg/24hr patch Place 1 patch (14 mg total) onto the skin daily. 12/31/20 03/01/21  Sidney Ace, MD  oxyCODONE (OXY IR/ROXICODONE) 5 MG immediate release tablet Take 1-2 tablets (5-10 mg total) by mouth every 6 (six) hours as needed for up to 10 days for severe pain. 12/31/20 01/10/21  Sidney Ace, MD  tizanidine (ZANAFLEX) 2 MG capsule Take 1 capsule (2 mg total) by mouth 3 (three) times daily as needed for muscle spasms. Patient not taking: No sig reported 11/03/20 02/01/21  Milinda Pointer, MD  traZODone (DESYREL) 100 MG tablet Take 1 tablet (100 mg total) by mouth at bedtime. Patient taking differently: Take 200 mg by mouth at bedtime. 08/21/18   Gladstone Lighter, MD    Allergies Patient has no known allergies.  Family History  Problem Relation Age of Onset  . Aneurysm Other     Social History Social History   Tobacco Use  . Smoking status: Current Some Day Smoker    Packs/day: 0.00    Types: Cigarettes  . Smokeless tobacco: Never Used  . Tobacco comment: pt states 2 cigs per month   Substance Use Topics  . Alcohol use: No  . Drug use: No    Review of Systems  Constitutional: No  fever. Eyes: No redness. ENT: No neck pain. Cardiovascular: Denies chest pain. Respiratory: Denies shortness of breath. Gastrointestinal: No vomiting. Genitourinary: Negative for flank pain. Musculoskeletal: Positive for left hip pain. Skin: Negative for rash. Neurological: Negative for focal weakness or numbness.   ____________________________________________   PHYSICAL EXAM:  VITAL SIGNS: ED Triage Vitals  Enc Vitals Group     BP 01/11/21 1841 (!) 141/94     Pulse Rate 01/11/21 1841 (!) 114     Resp 01/11/21 1841 18     Temp 01/11/21 1841 98.8 F (37.1 C)     Temp  Source 01/11/21 1841 Oral     SpO2 01/11/21 1841 100 %     Weight 01/11/21 1842 120 lb (54.4 kg)     Height 01/11/21 1842 5\' 4"  (1.626 m)     Head Circumference --      Peak Flow --      Pain Score 01/11/21 1842 10     Pain Loc --      Pain Edu? --      Excl. in [place]? --     Constitutional: Alert and oriented.  Uncomfortable appearing but in no acute distress. Eyes: Conjunctivae are normal.  Head: Atraumatic. Nose: No congestion/rhinnorhea. Mouth/Throat: Mucous membranes are moist.   Neck: Normal range of motion.  Cardiovascular: Normal rate, regular rhythm.  Good peripheral circulation. Respiratory: Normal respiratory effort.  No retractions.  Gastrointestinal: No distention.  Musculoskeletal: No lower extremity edema.  Extremities warm and well perfused.  Left lower extremity slightly shortened.  Pain on any range of motion of the left hip.  2+ DP pulse on the left. Neurologic:  Normal speech and language.  Motor and sensory intact in all extremities. Skin:  Skin is warm and dry. No rash noted. Psychiatric: Mood and affect are normal. Speech and behavior are normal.  ____________________________________________   LABS (all labs ordered are listed, but only abnormal results are displayed)  Labs Reviewed  BASIC METABOLIC PANEL - Abnormal; Notable for the following components:      Result Value    Potassium 3.2 (*)    Glucose, Bld 114 (*)    Calcium 8.6 (*)    All other components within normal limits  CBC WITH DIFFERENTIAL/PLATELET - Abnormal; Notable for the following components:   Hemoglobin 11.4 (*)    HCT 34.9 (*)    All other components within normal limits  RESP PANEL BY RT-PCR (FLU A&B, COVID) ARPGX2  PROTIME-INR   ____________________________________________  EKG  ED ECG REPORT I, Arta Silence, the attending physician, personally viewed and interpreted this ECG.  Date: 01/11/2021 EKG Time:  Rate:  Rhythm:  QRS Axis:  Intervals:  ST/T Wave abnormalities:  Narrative Interpretation:   ____________________________________________  RADIOLOGY  XR L hip: Femoral neck fracture Chest x-ray interpreted by me shows no focal infiltrate or edema  ____________________________________________   PROCEDURES  Procedure(s) performed: No  Procedures  Critical Care performed: No ____________________________________________   INITIAL IMPRESSION / ASSESSMENT AND PLAN / ED COURSE  Pertinent labs & imaging results that were available during my care of the patient were reviewed by me and considered in my medical decision making (see chart for details).  75 year old female with PMH as noted above presents with left hip pain after mechanical fall from standing height.  She denies other acute injuries.  On exam, the patient is uncomfortable appearing.  Her vital signs are normal except for mild tachycardia likely due to acute pain.  She has tenderness to the left hip and pain on any range of motion.  Left lower extremities neuro/vascular intact.  Neurologic exam is nonfocal.  Presentation is concerning for left hip fracture.  We will obtain x-rays, lab work-up, and reassess.   ----------------------------------------- 8:55 PM on 01/11/2021 -----------------------------------------  X-ray confirms a left femoral neck fracture.  I consulted Dr. Harlow Mares from  orthopedics who will evaluate the patient.  She is on Eliquis so she will not have surgery immediately.  I then consulted Dr. Posey Pronto from the hospitalist service for admission.  ____________________________________________   FINAL CLINICAL IMPRESSION(S) / ED DIAGNOSES  Final  diagnoses:  Closed fracture of left hip, initial encounter ([place])      NEW MEDICATIONS STARTED DURING THIS VISIT:  New Prescriptions   No medications on file     Note:  This document was prepared using Dragon voice recognition software and may include unintentional dictation errors.   Arta Silence, MD 01/11/21 2110

## 2021-01-11 NOTE — ED Notes (Signed)
Informed Elmyra Ricks, RN of bed assignment

## 2021-01-11 NOTE — ED Triage Notes (Signed)
Pt in via EMS from home with c/o a fall and pain to left hip. EMS reports pt was walking and slipped on the bandage to her right foot and fell. EMS reports pt with shortening and rotation to left hip. Pt on 2L of oxygen at all times, recent surgery to right foot.

## 2021-01-11 NOTE — H&P (Signed)
History and Physical    [name] BJY:[id number] DOB: 1946-06-10 DOA: 01/11/2021  PCP: Albina Billet, MD  Patient coming from: Home via EMS  I have personally briefly reviewed patient's old medical records in [place]  Chief Complaint: Left hip pain after fall  HPI: [name] is a 75 y.o. female with medical history significant for PAD s/p mechanical thrombectomy and stent angioplasty of the right lower extremity and third right toe amputation, COPD, chronic respiratory failure with hypoxia on 2 L supplemental O2 via [place] at all times, CAD, hyperlipidemia, depression/anxiety, hard of hearing who presents to the ED for evaluation of left hip pain after a fall.  Patient was recently hospitalized from 12/22/2020-12/31/2020 for gangrene of the third toe on the right foot.  She underwent angiogram of RLE on 12/23/2020 with mechanical thrombectomy and catheter directed thrombolytic therapy to the right SFA, popliteal artery, and anterior tibial artery.  S/p amputation of the third toe on the right foot 12/26/2020.  She has been on aspirin and Eliquis.  She was discharged to home with home health after SNF refusal.  Patient states she was walking at home when she slipped due to the surgical bandages on her right foot being slick.  She says her left leg bent awkwardly and she fell to the floor.  She bumped her head on the doorway before she fell but did not lose consciousness.  She was unable to stand on her own power.  She says this felt similar to when she had a right hip fracture.  She did not have any other significant injuries.  She called EMS and was brought to the ED for further evaluation.  ED Course:  Initial vitals showed BP 141/94, pulse 114, RR 18, temp 98.8 F, SPO2 100% on 2 L supplemental O2 via [place].  Labs show sodium 136, potassium 3.6, bicarb 25, BUN 13, creatinine 0.62, serum glucose 114, WBC 9.5, hemoglobin 11.4, platelets 389,000, INR 1.2.  SARS-CoV-2 PCR is negative.  Influenza  A/B PCR's are negative.  Left hip x-ray shows a mildly impacted fracture of the left femoral neck.  Portable chest x-ray is negative for focal consolidation, pleural effusion, or pneumothorax.  Cervical spine ACDF noted.  Patient was given IV morphine and Zofran.  EDP discussed with on-call orthopedics who will consult.  The hospitalist service was consulted to admit for further evaluation and management.  Review of Systems: All systems reviewed and are negative except as documented in history of present illness above.   Past Medical History:  Diagnosis Date  . Anxiety   . CAD (coronary artery disease)   . Carpal tunnel syndrome   . Cataract   . Complication of anesthesia    has woken up at times  . COPD (chronic obstructive pulmonary disease) ([place])   . CRP elevated 09/14/2015  . Depression   . Difficult intubation   . Dyslipidemia   . Dyspnea    DOE  . Dysrhythmia    hx palpatations  . Elevated sedimentation rate 09/14/2015  . Esophageal spasm   . Gastrointestinal parasites   . GERD (gastroesophageal reflux disease)   . Hiatal hernia   . History of peptic ulcer disease   . Hyperthyroidism   . Hypothyroidism   . Low magnesium levels 09/14/2015  . Pelvic fracture ([place]) 2008   fall from riding a horse  . Reflux   . Rotator cuff injury   . Stenosis, spinal, lumbar     Past Surgical  History:  Procedure Laterality Date  . AMPUTATION TOE Right 12/26/2020   Procedure: AMPUTATION TOE-3rd Toes;  Surgeon: Samara Deist, DPM;  Location: [facility];  Service: Podiatry;  Laterality: Right;  . APPENDECTOMY    . BACK SURGERY     CERVICAL FUSION  . CARPAL TUNNEL RELEASE    . CATARACT EXTRACTION W/PHACO Right 08/07/2017   Procedure: CATARACT EXTRACTION PHACO AND INTRAOCULAR LENS PLACEMENT (IOC);  Surgeon: Birder Robson, MD;  Location: [facility];  Service: Ophthalmology;  Laterality: Right;  [place] 00:52.0 AP% 16.8 CDE 8.74 Fluid Pack Lot # [id number]  . CATARACT EXTRACTION W/PHACO  Left 09/04/2017   Procedure: CATARACT EXTRACTION PHACO AND INTRAOCULAR LENS PLACEMENT (IOC);  Surgeon: Birder Robson, MD;  Location: [facility];  Service: Ophthalmology;  Laterality: Left;  [place] 00:34 AP% 17.0 CDE 5.80 Fluid pack lot # [id number]  . CESAREAN SECTION    . CHOLECYSTECTOMY    . FRACTURE SURGERY    . HIP SURGERY    . INTRAMEDULLARY (IM) NAIL INTERTROCHANTERIC Right 10/01/2016   Procedure: INTRAMEDULLARY (IM) NAIL INTERTROCHANTRIC;  Surgeon: Corky Mull, MD;  Location: [facility];  Service: Orthopedics;  Laterality: Right;  . KYPHOPLASTY N/A 08/20/2018   Procedure: EPPIRJJOACZ-Y6;  Surgeon: Hessie Knows, MD;  Location: [facility];  Service: Orthopedics;  Laterality: N/A;  . LOWER EXTREMITY ANGIOGRAPHY Right 12/18/2019   Procedure: LOWER EXTREMITY ANGIOGRAPHY;  Surgeon: Algernon Huxley, MD;  Location: [place] [facility];  Service: Cardiovascular;  Laterality: Right;  . LOWER EXTREMITY ANGIOGRAPHY Right 12/02/2020   Procedure: LOWER EXTREMITY ANGIOGRAPHY;  Surgeon: Algernon Huxley, MD;  Location: [place] [facility];  Service: Cardiovascular;  Laterality: Right;  . LOWER EXTREMITY ANGIOGRAPHY Right 12/23/2020   Procedure: Lower Extremity Angiography;  Surgeon: Algernon Huxley, MD;  Location: [place] [facility];  Service: Cardiovascular;  Laterality: Right;  . LOWER EXTREMITY ANGIOGRAPHY Right 12/24/2020   Procedure: Lower Extremity Angiography;  Surgeon: Algernon Huxley, MD;  Location: [place] [facility];  Service: Cardiovascular;  Laterality: Right;  . NECK SURGERY    . NOSE SURGERY    . ROTATOR CUFF REPAIR     x2  . SHOULDER ARTHROSCOPY  12/07/2011   Procedure: ARTHROSCOPY SHOULDER;  Surgeon: Ninetta Lights, MD;  Location: [place];  Service: Orthopedics;  Laterality: Right;  Debridement Partial Cuff Tear, Release Coracoacromial Ligament  . SHOULDER SURGERY  12/07/2011   right    Social History:  reports that she has been smoking cigarettes. She has been smoking about 0.00  packs per day. She has never used smokeless tobacco. She reports that she does not drink alcohol and does not use drugs.  No Known Allergies  Family History  Problem Relation Age of Onset  . Aneurysm Other      Prior to Admission medications   Medication Sig Start Date End Date Taking? Authorizing Provider  ALPRAZolam Duanne Moron) 0.5 MG tablet Take 0.5 mg by mouth every 6 (six) hours as needed for anxiety.    [provider]  ALPRAZolam Duanne Moron) 1 MG tablet Take 1 tablet (1 mg total) by mouth 4 (four) times daily as needed. Patient taking differently: Take 1 mg by mouth 2 (two) times daily as needed. 10/04/16   Epifanio Lesches, MD  amLODipine (NORVASC) 5 MG tablet Take 1 tablet (5 mg total) by mouth daily. 08/22/18   Gladstone Lighter, MD  apixaban (ELIQUIS) 5 MG TABS tablet Take 1 tablet (5 mg total) by mouth 2 (two) times daily. 12/31/20  03/01/21  Sidney Ace, MD  aspirin EC 81 MG tablet Take 1 tablet (81 mg total) by mouth daily. 12/02/20   Algernon Huxley, MD  atorvastatin (LIPITOR) 10 MG tablet Take 1 tablet (10 mg total) by mouth daily. 12/18/19 12/17/20  Algernon Huxley, MD  buPROPion (WELLBUTRIN SR) 150 MG 12 hr tablet Take 150 mg by mouth daily.     [provider]  escitalopram (LEXAPRO) 10 MG tablet Take 10 mg by mouth daily.  10/15/13   [provider]  Fluticasone-Salmeterol (ADVAIR DISKUS) 250-50 MCG/DOSE AEPB Inhale 1 puff into the lungs 2 (two) times daily. 12/31/20 01/30/21  Sidney Ace, MD  levothyroxine (SYNTHROID, LEVOTHROID) 88 MCG tablet Take 88 mcg by mouth daily before breakfast.     [provider]  montelukast (SINGULAIR) 10 MG tablet Take 10 mg by mouth daily.    [provider]  nicotine (NICODERM CQ - DOSED IN MG/24 HOURS) 14 mg/24hr patch Place 1 patch (14 mg total) onto the skin daily. 12/31/20 03/01/21  Sidney Ace, MD  oxyCODONE (OXY IR/ROXICODONE) 5 MG immediate release tablet Take 1-2 tablets (5-10 mg  total) by mouth every 6 (six) hours as needed for up to 10 days for severe pain. 12/31/20 01/10/21  Sidney Ace, MD  tizanidine (ZANAFLEX) 2 MG capsule Take 1 capsule (2 mg total) by mouth 3 (three) times daily as needed for muscle spasms. Patient not taking: No sig reported 11/03/20 02/01/21  Milinda Pointer, MD  traZODone (DESYREL) 100 MG tablet Take 1 tablet (100 mg total) by mouth at bedtime. Patient taking differently: Take 200 mg by mouth at bedtime. 08/21/18   Gladstone Lighter, MD    Physical Exam: Vitals:   01/11/21 1841 01/11/21 1842 01/11/21 2032 01/11/21 2100  BP: (!) 141/94  (!) 130/94 (!) 147/76  Pulse: (!) 114  (!) 117 (!) 104  Resp: 18  20 (!) 22  Temp: 98.8 F (37.1 C)  99 F (37.2 C)   TempSrc: Oral  Oral   SpO2: 100%  98% 100%  Weight:  54.4 kg    Height:  5\' 4"  (1.626 m)     Constitutional: Thin elderly woman resting supine in bed, NAD, calm, comfortable Eyes: PERRL, lids and conjunctivae normal ENMT: Mucous membranes are moist. Posterior pharynx clear of any exudate or lesions.Normal dentition.  Neck: normal, supple, no masses. Respiratory: clear to auscultation anteriorly.  Normal respiratory effort. No accessory muscle use.  Cardiovascular: Regular rate and rhythm, no murmurs / rubs / gallops. No extremity edema. 2+ pedal pulses. Abdomen: no tenderness, no masses palpated.  Bowel sounds positive.  Musculoskeletal: no clubbing / cyanosis.  Left foot everted.  Range of motion left hip diminished due to fracture.  Wound dressing in place at the right foot.  S/p right third toe amputation. Skin: Distal second right toe with dry necrotic appearance. Neurologic: CN 2-12 grossly intact. Sensation intact. Strength diminished left hip due to fracture otherwise range of motion intact other extremities. Psychiatric: Normal judgment and insight. Alert and oriented x 3. Normal mood.   Labs on Admission: I have personally reviewed following labs and imaging  studies  CBC: Recent Labs  Lab 01/11/21 1842  WBC 9.5  NEUTROABS 7.3  HGB 11.4*  HCT 34.9*  MCV 88.6  PLT 128   Basic Metabolic Panel: Recent Labs  Lab 01/11/21 1842  NA 136  K 3.2*  CL 100  CO2 25  GLUCOSE 114*  BUN 13  CREATININE  0.62  CALCIUM 8.6*   GFR: Estimated Creatinine Clearance: 53.8 mL/min (by C-G formula based on SCr of 0.62 mg/dL). Liver Function Tests: No results for input(s): AST, ALT, ALKPHOS, BILITOT, PROT, ALBUMIN in the last 168 hours. No results for input(s): LIPASE, AMYLASE in the last 168 hours. No results for input(s): AMMONIA in the last 168 hours. Coagulation Profile: Recent Labs  Lab 01/11/21 1958  INR 1.2   Cardiac Enzymes: No results for input(s): CKTOTAL, CKMB, CKMBINDEX, TROPONINI in the last 168 hours. BNP (last 3 results) No results for input(s): PROBNP in the last 8760 hours. HbA1C: No results for input(s): HGBA1C in the last 72 hours. CBG: No results for input(s): GLUCAP in the last 168 hours. Lipid Profile: No results for input(s): CHOL, HDL, LDLCALC, TRIG, CHOLHDL, LDLDIRECT in the last 72 hours. Thyroid Function Tests: No results for input(s): TSH, T4TOTAL, FREET4, T3FREE, THYROIDAB in the last 72 hours. Anemia Panel: No results for input(s): VITAMINB12, FOLATE, FERRITIN, TIBC, IRON, RETICCTPCT in the last 72 hours. Urine analysis:    Component Value Date/Time   COLORURINE YELLOW (A) 10/21/2018 2212   APPEARANCEUR CLEAR (A) 10/21/2018 2212   LABSPEC 1.029 10/21/2018 2212   PHURINE 6.0 10/21/2018 2212   GLUCOSEU NEGATIVE 10/21/2018 2212   HGBUR NEGATIVE 10/21/2018 2212   BILIRUBINUR NEGATIVE 10/21/2018 2212   KETONESUR 20 (A) 10/21/2018 2212   PROTEINUR NEGATIVE 10/21/2018 2212   NITRITE NEGATIVE 10/21/2018 2212   LEUKOCYTESUR NEGATIVE 10/21/2018 2212    Radiological Exams on Admission: DG Chest 1 View  Result Date: 01/11/2021 CLINICAL DATA:  75 year old female with fall and left hip pain. EXAM: CHEST  1 VIEW  COMPARISON:  Chest radiograph dated 12/30/2020. FINDINGS: No focal consolidation, pleural effusion, or pneumothorax. The cardiac silhouette is within limits. No acute osseous pathology. Cervical spine ACDF. IMPRESSION: No active disease. Electronically Signed   By: Anner Crete M.D.   On: 01/11/2021 20:20   DG Hip Unilat W or Wo Pelvis 2-3 Views Left  Result Date: 01/11/2021 CLINICAL DATA:  75 year old female with fall and left hip pain. EXAM: DG HIP (WITH OR WITHOUT PELVIS) 2-3V LEFT COMPARISON:  None. FINDINGS: There is a fracture of the left femoral neck with mild impaction. Prior right femoral ORIF. No dislocation. The bones are osteopenic. The soft tissues are unremarkable. IMPRESSION: Mildly impacted fracture of the left femoral neck. Electronically Signed   By: Anner Crete M.D.   On: 01/11/2021 20:18    EKG: Ordered and pending.  Assessment/Plan Principal Problem:   Fracture of femoral neck, left, closed (HCC) Active Problems:   HLD (hyperlipidemia)   Hypothyroidism   Anxiety and depression   Chronic obstructive pulmonary disease (COPD) (HCC)   PAD (peripheral artery disease) (HCC)   [name] is a 75 y.o. female with medical history significant for PAD s/p mechanical thrombectomy and stent angioplasty of the right lower extremity and third right toe amputation, COPD, chronic respiratory failure with hypoxia on 2 L supplemental O2 via [place] at all times, CAD, hyperlipidemia, depression/anxiety who is admitted with left femoral neck fracture.  Left femoral neck fracture: Occurring after a mechanical fall.  She is on Eliquis due to her history of PAD.  She is not sure when the last dose was taking but believes it was night of 01/10/2021. -Holding home Eliquis and aspirin -Keep n.p.o. after midnight -Continue analgesics as needed with hold parameters -Orthopedics consulted and to see  COPD/chronic respiratory failure with hypoxia: Chronic and stable on home 2 L  of home O2 via  [place].  Continue Advair, Singulair, albuterol as needed.  Hypokalemia/hypomagnesemia: Supplement with IV magnesium followed by oral potassium.  PAD: S/p recent admission with angiogram of RLE on 12/23/2020 with mechanical thrombectomy and catheter directed thrombolytic therapy to the right SFA, popliteal artery, and anterior tibial artery.  S/p amputation of the third toe on the right foot 12/26/2020. -Holding Eliquis and aspirin as above -Continue atorvastatin  Hypertension: Continue amlodipine.  Hypothyroidism: Continue Synthroid.  Hyperlipidemia: Atorvastatin.  Anxiety/depression: Continue Wellbutrin and Lexapro.  DVT prophylaxis: SCDs Code Status: Full code, confirmed with patient Family Communication: Discussed with patient family Disposition Plan: From home, dispo pending further orthopedic evaluation/intervention Consults called: Orthopedics Level of care: Med-Surg Admission status:  Status is: Inpatient  Remains inpatient appropriate because:Ongoing active pain requiring inpatient pain management, Unsafe d/c plan and Inpatient level of care appropriate due to severity of illness   Dispo: The patient is from: Home              Anticipated d/c is to: Home versus SNF              Anticipated d/c date is: 3 days              Patient currently is not medically stable to d/c.   Zada Finders MD [facility]  If 7PM-7AM, please contact night-coverage [email or web address]  01/11/2021, 9:23 PM

## 2021-01-11 NOTE — Telephone Encounter (Signed)
Amy from [facility] physical therapy called requesting verbal orders to come in the home two times for two weeks and one time for 3 weeks to help with mobility,strength,and fall prevention. Amy requested for social worker evaulation and access needs to start next week. I spoke with Eulogio Ditch NP and she is fine with requesting orders. Amy with [facility] has been made aware.

## 2021-01-12 ENCOUNTER — Encounter: Payer: Self-pay | Admitting: Internal Medicine

## 2021-01-12 LAB — CBC
HCT: 29.9 % — ABNORMAL LOW (ref 36.0–46.0)
Hemoglobin: 9.6 g/dL — ABNORMAL LOW (ref 12.0–15.0)
MCH: 28.7 pg (ref 26.0–34.0)
MCHC: 32.1 g/dL (ref 30.0–36.0)
MCV: 89.3 fL (ref 80.0–100.0)
Platelets: 287 10*3/uL (ref 150–400)
RBC: 3.35 MIL/uL — ABNORMAL LOW (ref 3.87–5.11)
RDW: 15 % (ref 11.5–15.5)
WBC: 9.1 10*3/uL (ref 4.0–10.5)
nRBC: 0 % (ref 0.0–0.2)

## 2021-01-12 LAB — TYPE AND SCREEN
ABO/RH(D): O POS
Antibody Screen: NEGATIVE

## 2021-01-12 LAB — MAGNESIUM: Magnesium: 2.3 mg/dL (ref 1.7–2.4)

## 2021-01-12 LAB — BASIC METABOLIC PANEL
Anion gap: 6 (ref 5–15)
BUN: 14 mg/dL (ref 8–23)
CO2: 30 mmol/L (ref 22–32)
Calcium: 8.2 mg/dL — ABNORMAL LOW (ref 8.9–10.3)
Chloride: 101 mmol/L (ref 98–111)
Creatinine, Ser: 0.66 mg/dL (ref 0.44–1.00)
GFR, Estimated: >60 mL/min (ref >60)
Glucose, Bld: 134 mg/dL — ABNORMAL HIGH (ref 70–99)
Potassium: 2.8 mmol/L — ABNORMAL LOW (ref 3.5–5.1)
Sodium: 137 mmol/L (ref 135–145)

## 2021-01-12 LAB — VITAMIN D 25 HYDROXY (VIT D DEFICIENCY, FRACTURES): Vit D, 25-Hydroxy: 14.25 ng/mL — ABNORMAL LOW (ref 30–100)

## 2021-01-12 MED ORDER — POTASSIUM CHLORIDE CRYS ER 20 MEQ PO TBCR
40.0000 meq | EXTENDED_RELEASE_TABLET | Freq: Once | ORAL | Status: AC
  Administered 2021-01-12: 40 meq via ORAL
  Filled 2021-01-12: qty 2

## 2021-01-12 MED ORDER — ENOXAPARIN SODIUM 40 MG/0.4ML ~~LOC~~ SOLN
40.0000 mg | SUBCUTANEOUS | Status: AC
Start: 2021-01-12 — End: 2021-01-14
  Administered 2021-01-13: 40 mg via SUBCUTANEOUS
  Filled 2021-01-12 (×2): qty 0.4

## 2021-01-12 MED ORDER — METHOCARBAMOL 750 MG PO TABS
750.0000 mg | ORAL_TABLET | Freq: Three times a day (TID) | ORAL | Status: DC
  Administered 2021-01-12 – 2021-01-20 (×23): 750 mg via ORAL
  Filled 2021-01-12 (×26): qty 1

## 2021-01-12 NOTE — Progress Notes (Signed)
Patient has no changes, came from ED as yellow.  01/12/21 0139  Assess: MEWS Score  Temp 97.6 F (36.4 C)  BP (!) 84/68  Pulse Rate (!) 108  Resp 20  SpO2 99 %  O2 Device Nasal Cannula  O2 Flow Rate (L/min) 4 L/min  Assess: MEWS Score  MEWS Temp 0  MEWS Systolic 1  MEWS Pulse 1  MEWS RR 0  MEWS LOC 0  MEWS Score 2  MEWS Score Color Yellow  Assess: if the MEWS score is Yellow or Red  Were vital signs taken at a resting state? Yes  Focused Assessment No change from prior assessment  Early Detection of Sepsis Score *See Row Information* Low  MEWS guidelines implemented *See Row Information* Yes  Treat  Pain Scale 0-10  Pain Score 10  Pain Type Acute pain  Pain Location Hip  Pain Orientation Left  Pain Radiating Towards hip  Pain Descriptors / Indicators Aching;Burning  Pain Frequency Constant  Pain Onset On-going  Patients Stated Pain Goal 10  Pain Intervention(s) Medication (See eMAR)  Complains of Anxiety;Restless  Interventions Cold pack  Neuro symptoms relieved by Rest  Take Vital Signs  Increase Vital Sign Frequency  Yellow: Q 2hr X 2 then Q 4hr X 2, if remains yellow, continue Q 4hrs  Notify: Charge Nurse/RN  Name of Charge Nurse/RN Notified Debra, RN  Date Charge Nurse/RN Notified 01/12/21  Time Charge Nurse/RN Notified 0204  Notify: Provider  Provider Name/Title Stark Klein, NP  Date Provider Notified 01/12/21  Time Provider Notified 0211  Notification Type Page  Notification Reason Change in status

## 2021-01-12 NOTE — Consult Note (Signed)
Full consult note to follow. Plan fracture repair on Friday given her anticoagulation status. Ok to eat today. Will follow.

## 2021-01-12 NOTE — Plan of Care (Signed)
  Problem: Health Behavior/Discharge Planning: Goal: Ability to manage health-related needs will improve Outcome: Progressing   Problem: Clinical Measurements: Goal: Will remain free from infection Outcome: Progressing   

## 2021-01-12 NOTE — Progress Notes (Signed)
PROGRESS NOTE    [name]  SWF:[id number] DOB: 1946/06/12 DOA: 01/11/2021 PCP: Albina Billet, MD  Brief Narrative:  75 y.o. female with medical history significant for PAD s/p mechanical thrombectomy and stent angioplasty of the right lower extremity and third right toe amputation, COPD, chronic respiratory failure with hypoxia on 2 L supplemental O2 via [place] at all times, CAD, hyperlipidemia, depression/anxiety, hard of hearing who presents to the ED for evaluation of left hip pain after a fall.  Patient was recently hospitalized from 12/22/2020-12/31/2020 for gangrene of the third toe on the right foot.  She underwent angiogram of RLE on 12/23/2020 with mechanical thrombectomy and catheter directed thrombolytic therapy to the right SFA, popliteal artery, and anterior tibial artery.  S/p amputation of the third toe on the right foot 12/26/2020.  She has been on aspirin and Eliquis.  She was discharged to home with home health after SNF refusal.  Had a mechanical fall at home attributed to slick surgical bandages.  Minor head trauma but did not lose consciousness.  No other significant injuries.  Imaging on admission demonstrated a mildly impacted fracture of the left femoral neck  Orthopedics on consult.  Plan for operative fixation Friday, 01/14/2021  Assessment & Plan:   Principal Problem:   Fracture of femoral neck, left, closed ([place]) Active Problems:   HLD (hyperlipidemia)   Hypothyroidism   Anxiety and depression   Hypomagnesemia   Chronic obstructive pulmonary disease (COPD) (HCC)   PAD (peripheral artery disease) (HCC)   Chronic respiratory failure with hypoxia (HCC)   Hypokalemia  Left femoral neck fracture Status post chemical fall Patient unsure but likely last dose of Eliquis was 2/21 Orthopedics on consult Plan: Operative fixation predicted Friday 2/25 Okay for diet today Multimodal pain control Avoid IV narcotics if able Eliquis on hold Lovenox for DVT prophylaxis  today  COPD/chronic respiratory failure with hypoxia: Chronic and stable on home 2 L of home O2 via .   Continue Advair, Singulair, albuterol as needed.  Hypokalemia/hypomagnesemia: Supplement with IV magnesium followed by oral potassium.  PAD: S/p recent admission with angiogram of RLE on 12/23/2020 with mechanical thrombectomy and catheter directed thrombolytic therapy to the right SFA, popliteal artery, and anterior tibial artery.  S/p amputation of the third toe on the right foot 12/26/2020. -Holding Eliquis and aspirin as above -Continue atorvastatin  Hypertension: Continue amlodipine.  Hypothyroidism: Continue Synthroid.  Hyperlipidemia: Atorvastatin.  Anxiety/depression: Continue Wellbutrin and Lexapro.    DVT prophylaxis: Lovenox Code Status: Full Family Communication: Left VM for relative [name] [phone] Disposition Plan: Status is: Inpatient  Remains inpatient appropriate because:Inpatient level of care appropriate due to severity of illness   Dispo: The patient is from: Home              Anticipated d/c is to: SNF              Anticipated d/c date is: 3 days              Patient currently is not medically stable to d/c.   Difficult to place patient No  Hip fracture.  Plan for operative fixation on Friday     Level of care: Med-Surg  Consultants:   Orthopedics  Procedures:   None  Antimicrobials:   None   Subjective: Seen and examined.  Reports improved pain control since admission.  No other issues.  Objective: Vitals:   01/12/21 0234 01/12/21 0341 01/12/21 0727 01/12/21 1109  BP: 106/69 105/65 (!) 151/83  97/69  Pulse: (!) 102 (!) 105 (!) 102 97  Resp:  16 18 16   Temp:  98 F (36.7 C) 98 F (36.7 C) 98.5 F (36.9 C)  TempSrc:      SpO2:  99% 100% 97%  Weight:      Height:        Intake/Output Summary (Last 24 hours) at 01/12/2021 1353 Last data filed at 01/12/2021 0946 Gross per 24 hour  Intake 820 ml  Output  --  Net 820 ml   Filed Weights   01/11/21 1842  Weight: 54.4 kg    Examination:  General exam: Appears calm and comfortable  Respiratory system: Clear to auscultation. Respiratory effort normal. Cardiovascular system: S1 & S2 heard, RRR. No JVD, murmurs, rubs, gallops or clicks. No pedal edema. Gastrointestinal system: Abdomen is nondistended, soft and nontender. No organomegaly or masses felt. Normal bowel sounds heard. Central nervous system: Alert and oriented. No focal neurological deficits. Extremities: Decreased power LLE, left leg shortened and externally rotated Skin: R distal second toe with dry necrotic appearance.  S/p right third toe amputation Psychiatry: Judgement and insight appear normal. Mood & affect appropriate.     Data Reviewed: I have personally reviewed following labs and imaging studies  CBC: Recent Labs  Lab 01/11/21 1842 01/12/21 0453  WBC 9.5 9.1  NEUTROABS 7.3  --   HGB 11.4* 9.6*  HCT 34.9* 29.9*  MCV 88.6 89.3  PLT 389 277   Basic Metabolic Panel: Recent Labs  Lab 01/11/21 1842 01/12/21 0453  NA 136 137  K 3.2* 2.8*  CL 100 101  CO2 25 30  GLUCOSE 114* 134*  BUN 13 14  CREATININE 0.62 0.66  CALCIUM 8.6* 8.2*  MG 1.6* 2.3   GFR: Estimated Creatinine Clearance: 53.8 mL/min (by C-G formula based on SCr of 0.66 mg/dL). Liver Function Tests: No results for input(s): AST, ALT, ALKPHOS, BILITOT, PROT, ALBUMIN in the last 168 hours. No results for input(s): LIPASE, AMYLASE in the last 168 hours. No results for input(s): AMMONIA in the last 168 hours. Coagulation Profile: Recent Labs  Lab 01/11/21 1958  INR 1.2   Cardiac Enzymes: No results for input(s): CKTOTAL, CKMB, CKMBINDEX, TROPONINI in the last 168 hours. BNP (last 3 results) No results for input(s): PROBNP in the last 8760 hours. HbA1C: No results for input(s): HGBA1C in the last 72 hours. CBG: No results for input(s): GLUCAP in the last 168 hours. Lipid Profile: No  results for input(s): CHOL, HDL, LDLCALC, TRIG, CHOLHDL, LDLDIRECT in the last 72 hours. Thyroid Function Tests: No results for input(s): TSH, T4TOTAL, FREET4, T3FREE, THYROIDAB in the last 72 hours. Anemia Panel: No results for input(s): VITAMINB12, FOLATE, FERRITIN, TIBC, IRON, RETICCTPCT in the last 72 hours. Sepsis Labs: No results for input(s): PROCALCITON, LATICACIDVEN in the last 168 hours.  Recent Results (from the past 240 hour(s))  Resp Panel by RT-PCR (Flu A&B, Covid) Nasopharyngeal Swab     Status: None   Collection Time: 01/11/21  8:35 PM   Specimen: Nasopharyngeal Swab; Nasopharyngeal(NP) swabs in vial transport medium  Result Value Ref Range Status   SARS Coronavirus 2 by RT PCR NEGATIVE NEGATIVE Final    Comment: (NOTE) SARS-CoV-2 target nucleic acids are NOT DETECTED.  The SARS-CoV-2 RNA is generally detectable in upper respiratory specimens during the acute phase of infection. The lowest concentration of SARS-CoV-2 viral copies this assay can detect is 138 copies/mL. A negative result does not preclude SARS-Cov-2 infection and should not be used  as the sole basis for treatment or other patient management decisions. A negative result may occur with  improper specimen collection/handling, submission of specimen other than nasopharyngeal swab, presence of viral mutation(s) within the areas targeted by this assay, and inadequate number of viral copies(<138 copies/mL). A negative result must be combined with clinical observations, patient history, and epidemiological information. The expected result is Negative.  Fact Sheet for Patients:  EntrepreneurPulse.com.au  Fact Sheet for Healthcare Providers:  IncredibleEmployment.be  This test is no t yet approved or cleared by the [place] FDA and  has been authorized for detection and/or diagnosis of SARS-CoV-2 by FDA under an Emergency Use Authorization (EUA). This EUA will remain   in effect (meaning this test can be used) for the duration of the COVID-19 declaration under Section 564(b)(1) of the Act, 21 U.S.C.section 360bbb-3(b)(1), unless the authorization is terminated  or revoked sooner.       Influenza A by PCR NEGATIVE NEGATIVE Final   Influenza B by PCR NEGATIVE NEGATIVE Final    Comment: (NOTE) The Xpert Xpress SARS-CoV-2/FLU/RSV plus assay is intended as an aid in the diagnosis of influenza from Nasopharyngeal swab specimens and should not be used as a sole basis for treatment. Nasal washings and aspirates are unacceptable for Xpert Xpress SARS-CoV-2/FLU/RSV testing.  Fact Sheet for Patients: EntrepreneurPulse.com.au  Fact Sheet for Healthcare Providers: IncredibleEmployment.be  This test is not yet approved or cleared by the [place] FDA and has been authorized for detection and/or diagnosis of SARS-CoV-2 by FDA under an Emergency Use Authorization (EUA). This EUA will remain in effect (meaning this test can be used) for the duration of the COVID-19 declaration under Section 564(b)(1) of the Act, 21 U.S.C. section 360bbb-3(b)(1), unless the authorization is terminated or revoked.  Performed at [facility], [address] [place], [place] [address]          Radiology Studies: DG Chest 1 View  Result Date: 01/11/2021 CLINICAL DATA:  75 year old female with fall and left hip pain. EXAM: CHEST  1 VIEW COMPARISON:  Chest radiograph dated 12/30/2020. FINDINGS: No focal consolidation, pleural effusion, or pneumothorax. The cardiac silhouette is within limits. No acute osseous pathology. Cervical spine ACDF. IMPRESSION: No active disease. Electronically Signed   By: Anner Crete M.D.   On: 01/11/2021 20:20   DG Hip Unilat W or Wo Pelvis 2-3 Views Left  Result Date: 01/11/2021 CLINICAL DATA:  75 year old female with fall and left hip pain. EXAM: DG HIP (WITH OR WITHOUT PELVIS) 2-3V LEFT  COMPARISON:  None. FINDINGS: There is a fracture of the left femoral neck with mild impaction. Prior right femoral ORIF. No dislocation. The bones are osteopenic. The soft tissues are unremarkable. IMPRESSION: Mildly impacted fracture of the left femoral neck. Electronically Signed   By: Anner Crete M.D.   On: 01/11/2021 20:18        Scheduled Meds: . amLODipine  5 mg Oral Daily  . atorvastatin  10 mg Oral Daily  . buPROPion  150 mg Oral Daily  . escitalopram  10 mg Oral Daily  . levothyroxine  88 mcg Oral QAC breakfast  . methocarbamol  750 mg Oral TID  . mometasone-formoterol  2 puff Inhalation BID  . montelukast  10 mg Oral Daily  . traZODone  100 mg Oral QHS   Continuous Infusions:   LOS: 1 day    Time spent: 25 minutes    Sidney Ace, MD [facility] Pager [phone]  If 7PM-7AM, please  contact night-coverage 01/12/2021, 1:53 PM

## 2021-01-12 NOTE — Progress Notes (Signed)
BP has improved, HR is decreasing.  Patient is asymptomatic  01/12/21 0341  Assess: MEWS Score  Temp 98 F (36.7 C)  BP 105/65  Pulse Rate (!) 105  Resp 16  SpO2 99 %  O2 Device Nasal Cannula  Assess: MEWS Score  MEWS Temp 0  MEWS Systolic 0  MEWS Pulse 1  MEWS RR 0  MEWS LOC 0  MEWS Score 1  MEWS Score Color Green  Assess: if the MEWS score is Yellow or Red  Were vital signs taken at a resting state? Yes  Focused Assessment Change from prior assessment (see assessment flowsheet)  Early Detection of Sepsis Score *See Row Information* Low  MEWS guidelines implemented *See Row Information* Yes

## 2021-01-13 ENCOUNTER — Inpatient Hospital Stay: Payer: Medicare HMO

## 2021-01-13 ENCOUNTER — Encounter: Payer: Self-pay | Admitting: Internal Medicine

## 2021-01-13 LAB — CBC
HCT: 31.7 % — ABNORMAL LOW (ref 36.0–46.0)
Hemoglobin: 9.9 g/dL — ABNORMAL LOW (ref 12.0–15.0)
MCH: 28.5 pg (ref 26.0–34.0)
MCHC: 31.2 g/dL (ref 30.0–36.0)
MCV: 91.4 fL (ref 80.0–100.0)
Platelets: 264 10*3/uL (ref 150–400)
RBC: 3.47 MIL/uL — ABNORMAL LOW (ref 3.87–5.11)
RDW: 15 % (ref 11.5–15.5)
WBC: 14 10*3/uL — ABNORMAL HIGH (ref 4.0–10.5)
nRBC: 0 % (ref 0.0–0.2)

## 2021-01-13 LAB — PROCALCITONIN: Procalcitonin: 0.36 ng/mL

## 2021-01-13 LAB — BASIC METABOLIC PANEL WITH GFR
Anion gap: 11 (ref 5–15)
BUN: 10 mg/dL (ref 8–23)
CO2: 25 mmol/L (ref 22–32)
Calcium: 8.5 mg/dL — ABNORMAL LOW (ref 8.9–10.3)
Chloride: 100 mmol/L (ref 98–111)
Creatinine, Ser: 0.6 mg/dL (ref 0.44–1.00)
GFR, Estimated: >60 mL/min
Glucose, Bld: 102 mg/dL — ABNORMAL HIGH (ref 70–99)
Potassium: 3.9 mmol/L (ref 3.5–5.1)
Sodium: 136 mmol/L (ref 135–145)

## 2021-01-13 LAB — PROTIME-INR
INR: 1.1 (ref 0.8–1.2)
Prothrombin Time: 14.1 s (ref 11.4–15.2)

## 2021-01-13 MED ORDER — SODIUM CHLORIDE 0.9 % IV SOLN
3.0000 g | Freq: Four times a day (QID) | INTRAVENOUS | Status: AC
  Administered 2021-01-13 – 2021-01-18 (×19): 3 g via INTRAVENOUS
  Filled 2021-01-13: qty 8
  Filled 2021-01-13: qty 3
  Filled 2021-01-13 (×6): qty 8
  Filled 2021-01-13 (×4): qty 3
  Filled 2021-01-13 (×2): qty 8
  Filled 2021-01-13: qty 3
  Filled 2021-01-13 (×3): qty 8
  Filled 2021-01-13: qty 3
  Filled 2021-01-13 (×2): qty 8

## 2021-01-13 MED ORDER — IOHEXOL 350 MG/ML SOLN
75.0000 mL | Freq: Once | INTRAVENOUS | Status: AC | PRN
  Administered 2021-01-13: 75 mL via INTRAVENOUS

## 2021-01-13 NOTE — Progress Notes (Signed)
ORTHOPAEDIC CONSULTATION  REQUESTING PHYSICIAN: Sidney Ace, MD  Chief Complaint: Patient status post third toe amputation right foot by myself about 2 weeks ago.  Admitted with hip fracture left hip  HPI: [name] is a 75 y.o. female who complains of follow-up for third toe amputation right foot.  Patient was scheduled to be seen in the outpatient clinic yesterday.  Sustained a fall and is admitted with hip fracture.  Patient not able to answer questions appropriately.  Confused this morning.  Past Medical History:  Diagnosis Date  . Anxiety   . CAD (coronary artery disease)   . Carpal tunnel syndrome   . Cataract   . Complication of anesthesia    has woken up at times  . COPD (chronic obstructive pulmonary disease) ([place])   . CRP elevated 09/14/2015  . Depression   . Difficult intubation   . Dyslipidemia   . Dyspnea    DOE  . Dysrhythmia    hx palpatations  . Elevated sedimentation rate 09/14/2015  . Esophageal spasm   . Gastrointestinal parasites   . GERD (gastroesophageal reflux disease)   . Hiatal hernia   . History of peptic ulcer disease   . Hyperthyroidism   . Hypothyroidism   . Low magnesium levels 09/14/2015  . Pelvic fracture ([place]) 2008   fall from riding a horse  . Reflux   . Rotator cuff injury   . Stenosis, spinal, lumbar    Past Surgical History:  Procedure Laterality Date  . AMPUTATION TOE Right 12/26/2020   Procedure: AMPUTATION TOE-3rd Toes;  Surgeon: Samara Deist, DPM;  Location: [facility];  Service: Podiatry;  Laterality: Right;  . APPENDECTOMY    . BACK SURGERY     CERVICAL FUSION  . CARPAL TUNNEL RELEASE    . CATARACT EXTRACTION W/PHACO Right 08/07/2017   Procedure: CATARACT EXTRACTION PHACO AND INTRAOCULAR LENS PLACEMENT (IOC);  Surgeon: Birder Robson, MD;  Location: [facility];  Service: Ophthalmology;  Laterality: Right;  [place] 00:52.0 AP% 16.8 CDE 8.74 Fluid Pack Lot # [id number]  . CATARACT EXTRACTION W/PHACO Left 09/04/2017    Procedure: CATARACT EXTRACTION PHACO AND INTRAOCULAR LENS PLACEMENT (IOC);  Surgeon: Birder Robson, MD;  Location: [facility];  Service: Ophthalmology;  Laterality: Left;  [place] 00:34 AP% 17.0 CDE 5.80 Fluid pack lot # [id number]  . CESAREAN SECTION    . CHOLECYSTECTOMY    . FRACTURE SURGERY    . HIP SURGERY    . INTRAMEDULLARY (IM) NAIL INTERTROCHANTERIC Right 10/01/2016   Procedure: INTRAMEDULLARY (IM) NAIL INTERTROCHANTRIC;  Surgeon: Corky Mull, MD;  Location: [facility];  Service: Orthopedics;  Laterality: Right;  . KYPHOPLASTY N/A 08/20/2018   Procedure: [id number];  Surgeon: Hessie Knows, MD;  Location: [facility];  Service: Orthopedics;  Laterality: N/A;  . LOWER EXTREMITY ANGIOGRAPHY Right 12/18/2019   Procedure: LOWER EXTREMITY ANGIOGRAPHY;  Surgeon: Algernon Huxley, MD;  Location: [place] [facility];  Service: Cardiovascular;  Laterality: Right;  . LOWER EXTREMITY ANGIOGRAPHY Right 12/02/2020   Procedure: LOWER EXTREMITY ANGIOGRAPHY;  Surgeon: Algernon Huxley, MD;  Location: [place] [facility];  Service: Cardiovascular;  Laterality: Right;  . LOWER EXTREMITY ANGIOGRAPHY Right 12/23/2020   Procedure: Lower Extremity Angiography;  Surgeon: Algernon Huxley, MD;  Location: [place] [facility];  Service: Cardiovascular;  Laterality: Right;  . LOWER EXTREMITY ANGIOGRAPHY Right 12/24/2020   Procedure: Lower Extremity Angiography;  Surgeon: Algernon Huxley, MD;  Location: [place] [facility];  Service: Cardiovascular;  Laterality: Right;  .  NECK SURGERY    . NOSE SURGERY    . ROTATOR CUFF REPAIR     x2  . SHOULDER ARTHROSCOPY  12/07/2011   Procedure: ARTHROSCOPY SHOULDER;  Surgeon: Ninetta Lights, MD;  Location: [facility];  Service: Orthopedics;  Laterality: Right;  Debridement Partial Cuff Tear, Release Coracoacromial Ligament  . SHOULDER SURGERY  12/07/2011   right   Social History   Socioeconomic History  . Marital status: Divorced    Spouse name: Not on file  . Number of  children: Not on file  . Years of education: Not on file  . Highest education level: Not on file  Occupational History  . Occupation: retired Air cabin crew  Tobacco Use  . Smoking status: Current Some Day Smoker    Packs/day: 0.00    Types: Cigarettes  . Smokeless tobacco: Never Used  . Tobacco comment: pt states 2 cigs per month   Substance and Sexual Activity  . Alcohol use: No  . Drug use: No  . Sexual activity: Never  Other Topics Concern  . Not on file  Social History Narrative  . Not on file   Social Determinants of Health   Financial Resource Strain: Not on file  Food Insecurity: Not on file  Transportation Needs: Not on file  Physical Activity: Not on file  Stress: Not on file  Social Connections: Not on file   Family History  Problem Relation Age of Onset  . Aneurysm Other    No Known Allergies Prior to Admission medications   Medication Sig Start Date End Date Taking? Authorizing Provider  ALPRAZolam Duanne Moron) 0.5 MG tablet Take 0.5 mg by mouth every 6 (six) hours as needed for anxiety.   Yes [provider]  amLODipine (NORVASC) 5 MG tablet Take 1 tablet (5 mg total) by mouth daily. 08/22/18  Yes Gladstone Lighter, MD  apixaban (ELIQUIS) 5 MG TABS tablet Take 1 tablet (5 mg total) by mouth 2 (two) times daily. 12/31/20 03/01/21 Yes Sreenath, Sudheer B, MD  aspirin EC 81 MG tablet Take 1 tablet (81 mg total) by mouth daily. 12/02/20  Yes Dew, Erskine Squibb, MD  atorvastatin (LIPITOR) 10 MG tablet Take 1 tablet (10 mg total) by mouth daily. 12/18/19 12/17/20 Yes Algernon Huxley, MD  buPROPion [facility] SR) 150 MG 12 hr tablet Take 150 mg by mouth daily.    Yes [provider]  escitalopram (LEXAPRO) 10 MG tablet Take 10 mg by mouth daily.  10/15/13  Yes [provider]  Fluticasone-Salmeterol (ADVAIR DISKUS) 250-50 MCG/DOSE AEPB Inhale 1 puff into the lungs 2 (two) times daily. 12/31/20 01/30/21 Yes Sreenath, Sudheer B, MD  levothyroxine (SYNTHROID,  LEVOTHROID) 88 MCG tablet Take 88 mcg by mouth daily before breakfast.    Yes [provider]  montelukast (SINGULAIR) 10 MG tablet Take 10 mg by mouth daily.   Yes [provider]  nicotine (NICODERM CQ - DOSED IN MG/24 HOURS) 14 mg/24hr patch Place 1 patch (14 mg total) onto the skin daily. 12/31/20 03/01/21 Yes Sreenath, Sudheer B, MD  oxyCODONE (OXY IR/ROXICODONE) 5 MG immediate release tablet Take 1-2 tablets (5-10 mg total) by mouth every 6 (six) hours as needed for up to 10 days for severe pain. 12/31/20 01/10/21 Yes Sreenath, Sudheer B, MD  tizanidine (ZANAFLEX) 2 MG capsule Take 1 capsule (2 mg total) by mouth 3 (three) times daily as needed for muscle spasms. 11/03/20 02/01/21 Yes Milinda Pointer, MD  traZODone (DESYREL) 100 MG tablet Take  1 tablet (100 mg total) by mouth at bedtime. Patient taking differently: Take 200 mg by mouth at bedtime. 08/21/18  Yes Gladstone Lighter, MD   DG Chest 1 View  Result Date: 01/11/2021 CLINICAL DATA:  75 year old female with fall and left hip pain. EXAM: CHEST  1 VIEW COMPARISON:  Chest radiograph dated 12/30/2020. FINDINGS: No focal consolidation, pleural effusion, or pneumothorax. The cardiac silhouette is within limits. No acute osseous pathology. Cervical spine ACDF. IMPRESSION: No active disease. Electronically Signed   By: Anner Crete M.D.   On: 01/11/2021 20:20   DG Hip Unilat W or Wo Pelvis 2-3 Views Left  Result Date: 01/11/2021 CLINICAL DATA:  75 year old female with fall and left hip pain. EXAM: DG HIP (WITH OR WITHOUT PELVIS) 2-3V LEFT COMPARISON:  None. FINDINGS: There is a fracture of the left femoral neck with mild impaction. Prior right femoral ORIF. No dislocation. The bones are osteopenic. The soft tissues are unremarkable. IMPRESSION: Mildly impacted fracture of the left femoral neck. Electronically Signed   By: Anner Crete M.D.   On: 01/11/2021 20:18    Positive ROS: All other systems have been reviewed and  were otherwise negative with the exception of those mentioned in the HPI and as above.  12 point ROS was performed.  Physical Exam: General: Alert and oriented.  No apparent distress.  Vascular:  Left foot:Dorsalis Pedis:  absent Posterior Tibial:  absent  Right foot: Dorsalis Pedis:  absent Posterior Tibial:  absent  Neuro:intact gross sensation  Derm: Left foot without wound  Right foot with healing third toe amputation site.  There is no dehiscence.  Sutures are intact.  The distal aspect of the second toe has gangrenous changes.  This is dry at this time.  Ortho/MS: Status post right third toe amputation.  Assessment: Gangrene right second toe.  Status post right third toe amputation.  Plan: Change the dressing to the right forefoot.  At this point the right second toe has dry gangrene.  Will likely need distal toe amputation.  Patient needs to be further stable prior to this.  At this point the toe is dry and not infected.  Can monitor for now.  Applied a heel protective dressing on the right heel.  Orders have been written for dressing changes.    Elesa Hacker, DPM Cell [phone]   01/13/2021 8:05 AM

## 2021-01-13 NOTE — TOC Progression Note (Signed)
Transition of Care [facility]) - Progression Note    Patient Details  Name: [name] MRN: [id number] Date of Birth: 07-25-1946  Transition of Care [facility]) CM/SW Contact  Shelbie Ammons, RN Phone Number: 01/13/2021, 9:43 AM  Clinical Narrative:   RNCM attempted to meet with patient at bedside however she is confused this morning. Patient is currently open to [facility] for RN and PT, this was confirmed with Tommi Rumps. In the past patient has refused skilled placement. TOC will follow for needs.          Expected Discharge Plan and Services                                                 Social Determinants of Health (SDOH) Interventions    Readmission Risk Interventions No flowsheet data found.

## 2021-01-13 NOTE — Progress Notes (Signed)
PROGRESS NOTE    [name]  ENI:[id number] DOB: August 28, 1946 DOA: 01/11/2021 PCP: Albina Billet, MD  Brief Narrative:  75 y.o. female with medical history significant for PAD s/p mechanical thrombectomy and stent angioplasty of the right lower extremity and third right toe amputation, COPD, chronic respiratory failure with hypoxia on 2 L supplemental O2 via [place] at all times, CAD, hyperlipidemia, depression/anxiety, hard of hearing who presents to the ED for evaluation of left hip pain after a fall.  Patient was recently hospitalized from 12/22/2020-12/31/2020 for gangrene of the third toe on the right foot.  She underwent angiogram of RLE on 12/23/2020 with mechanical thrombectomy and catheter directed thrombolytic therapy to the right SFA, popliteal artery, and anterior tibial artery.  S/p amputation of the third toe on the right foot 12/26/2020.  She has been on aspirin and Eliquis.  She was discharged to home with home health after SNF refusal.  Had a mechanical fall at home attributed to slick surgical bandages.  Minor head trauma but did not lose consciousness.  No other significant injuries.  Imaging on admission demonstrated a mildly impacted fracture of the left femoral neck  Orthopedics on consult.  Plan for operative fixation Friday, 01/14/2021  Assessment & Plan:   Principal Problem:   Fracture of femoral neck, left, closed ([place]) Active Problems:   HLD (hyperlipidemia)   Hypothyroidism   Anxiety and depression   Hypomagnesemia   Chronic obstructive pulmonary disease (COPD) (HCC)   PAD (peripheral artery disease) (HCC)   Chronic respiratory failure with hypoxia (HCC)   Hypokalemia  Left femoral neck fracture Status post chemical fall Patient unsure but likely last dose of Eliquis was 2/21 Orthopedics on consult Plan: Operative fixation predicted Friday 2/25 Regular diet today, n.p.o. after midnight Multimodal pain control Avoid IV narcotics if able Eliquis on hold Hold a.m.  chemoprophylaxis  COPD/chronic respiratory failure with hypoxia: Chronic and stable on home 2 L of home O2 via [place].   Continue Advair, Singulair, albuterol as needed.  Hypokalemia/hypomagnesemia: Supplement with IV magnesium followed by oral potassium.  PAD: S/p recent admission with angiogram of RLE on 12/23/2020 with mechanical thrombectomy and catheter directed thrombolytic therapy to the right SFA, popliteal artery, and anterior tibial artery.  S/p amputation of the third toe on the right foot 12/26/2020. -Holding Eliquis and aspirin as above -Continue atorvastatin  Hypertension: Continue amlodipine.  Hypothyroidism: Continue Synthroid.  Hyperlipidemia: Atorvastatin.  Anxiety/depression: Continue Wellbutrin and Lexapro.    DVT prophylaxis: Lovenox Code Status: Full Family Communication: Left VM for relative [name] [phone] Disposition Plan: Status is: Inpatient  Remains inpatient appropriate because:Inpatient level of care appropriate due to severity of illness   Dispo: The patient is from: Home              Anticipated d/c is to: SNF              Anticipated d/c date is: 3 days              Patient currently is not medically stable to d/c.   Difficult to place patient No  Hip fracture.  Plan for operative fixation on Friday     Level of care: Med-Surg  Consultants:   Orthopedics  Procedures:   None  Antimicrobials:   None   Subjective: Seen and examined. Had some pain overnight. No other issues  Objective: Vitals:   01/13/21 0427 01/13/21 0826 01/13/21 1152 01/13/21 1346  BP: (!) 108/55 123/68 99/69 97/62   Pulse: 95  87 84 98  Resp: 16 16 18 18   Temp: 98.5 F (36.9 C) 98.2 F (36.8 C) 98.7 F (37.1 C) 98.5 F (36.9 C)  TempSrc: Oral     SpO2: 97% 100% 92% 92%  Weight:      Height:        Intake/Output Summary (Last 24 hours) at 01/13/2021 1410 Last data filed at 01/13/2021 1346 Gross per 24 hour  Intake 240 ml  Output  700 ml  Net -460 ml   Filed Weights   01/11/21 1842  Weight: 54.4 kg    Examination:  General exam: Appears calm and comfortable  Respiratory system: Clear to auscultation. Respiratory effort normal. Cardiovascular system: S1 & S2 heard, RRR. No JVD, murmurs, rubs, gallops or clicks. No pedal edema. Gastrointestinal system: Abdomen is nondistended, soft and nontender. No organomegaly or masses felt. Normal bowel sounds heard. Central nervous system: Alert and oriented. No focal neurological deficits. Extremities: Decreased power LLE, left leg shortened and externally rotated Skin: R distal second toe with dry necrotic appearance.  S/p right third toe amputation Psychiatry: Judgement and insight appear normal. Mood & affect appropriate.     Data Reviewed: I have personally reviewed following labs and imaging studies  CBC: Recent Labs  Lab 01/11/21 1842 01/12/21 0453 01/13/21 1208  WBC 9.5 9.1 14.0*  NEUTROABS 7.3  --   --   HGB 11.4* 9.6* 9.9*  HCT 34.9* 29.9* 31.7*  MCV 88.6 89.3 91.4  PLT 389 287 569   Basic Metabolic Panel: Recent Labs  Lab 01/11/21 1842 01/12/21 0453 01/13/21 1208  NA 136 137 136  K 3.2* 2.8* 3.9  CL 100 101 100  CO2 25 30 25   GLUCOSE 114* 134* 102*  BUN 13 14 10   CREATININE 0.62 0.66 0.60  CALCIUM 8.6* 8.2* 8.5*  MG 1.6* 2.3  --    GFR: Estimated Creatinine Clearance: 53.8 mL/min (by C-G formula based on SCr of 0.6 mg/dL). Liver Function Tests: No results for input(s): AST, ALT, ALKPHOS, BILITOT, PROT, ALBUMIN in the last 168 hours. No results for input(s): LIPASE, AMYLASE in the last 168 hours. No results for input(s): AMMONIA in the last 168 hours. Coagulation Profile: Recent Labs  Lab 01/11/21 1958 01/13/21 0816  INR 1.2 1.1   Cardiac Enzymes: No results for input(s): CKTOTAL, CKMB, CKMBINDEX, TROPONINI in the last 168 hours. BNP (last 3 results) No results for input(s): PROBNP in the last 8760 hours. HbA1C: No results for  input(s): HGBA1C in the last 72 hours. CBG: No results for input(s): GLUCAP in the last 168 hours. Lipid Profile: No results for input(s): CHOL, HDL, LDLCALC, TRIG, CHOLHDL, LDLDIRECT in the last 72 hours. Thyroid Function Tests: No results for input(s): TSH, T4TOTAL, FREET4, T3FREE, THYROIDAB in the last 72 hours. Anemia Panel: No results for input(s): VITAMINB12, FOLATE, FERRITIN, TIBC, IRON, RETICCTPCT in the last 72 hours. Sepsis Labs: No results for input(s): PROCALCITON, LATICACIDVEN in the last 168 hours.  Recent Results (from the past 240 hour(s))  Resp Panel by RT-PCR (Flu A&B, Covid) Nasopharyngeal Swab     Status: None   Collection Time: 01/11/21  8:35 PM   Specimen: Nasopharyngeal Swab; Nasopharyngeal(NP) swabs in vial transport medium  Result Value Ref Range Status   SARS Coronavirus 2 by RT PCR NEGATIVE NEGATIVE Final    Comment: (NOTE) SARS-CoV-2 target nucleic acids are NOT DETECTED.  The SARS-CoV-2 RNA is generally detectable in upper respiratory specimens during the acute phase of infection. The lowest concentration of  SARS-CoV-2 viral copies this assay can detect is 138 copies/mL. A negative result does not preclude SARS-Cov-2 infection and should not be used as the sole basis for treatment or other patient management decisions. A negative result may occur with  improper specimen collection/handling, submission of specimen other than nasopharyngeal swab, presence of viral mutation(s) within the areas targeted by this assay, and inadequate number of viral copies(<138 copies/mL). A negative result must be combined with clinical observations, patient history, and epidemiological information. The expected result is Negative.  Fact Sheet for Patients:  EntrepreneurPulse.com.au  Fact Sheet for Healthcare Providers:  IncredibleEmployment.be  This test is no t yet approved or cleared by the [place] FDA and  has been  authorized for detection and/or diagnosis of SARS-CoV-2 by FDA under an Emergency Use Authorization (EUA). This EUA will remain  in effect (meaning this test can be used) for the duration of the COVID-19 declaration under Section 564(b)(1) of the Act, 21 U.S.C.section 360bbb-3(b)(1), unless the authorization is terminated  or revoked sooner.       Influenza A by PCR NEGATIVE NEGATIVE Final   Influenza B by PCR NEGATIVE NEGATIVE Final    Comment: (NOTE) The Xpert Xpress SARS-CoV-2/FLU/RSV plus assay is intended as an aid in the diagnosis of influenza from Nasopharyngeal swab specimens and should not be used as a sole basis for treatment. Nasal washings and aspirates are unacceptable for Xpert Xpress SARS-CoV-2/FLU/RSV testing.  Fact Sheet for Patients: EntrepreneurPulse.com.au  Fact Sheet for Healthcare Providers: IncredibleEmployment.be  This test is not yet approved or cleared by the [place] FDA and has been authorized for detection and/or diagnosis of SARS-CoV-2 by FDA under an Emergency Use Authorization (EUA). This EUA will remain in effect (meaning this test can be used) for the duration of the COVID-19 declaration under Section 564(b)(1) of the Act, 21 U.S.C. section 360bbb-3(b)(1), unless the authorization is terminated or revoked.  Performed at [facility], [address] [place], [place] [address]          Radiology Studies: DG Chest 1 View  Result Date: 01/11/2021 CLINICAL DATA:  75 year old female with fall and left hip pain. EXAM: CHEST  1 VIEW COMPARISON:  Chest radiograph dated 12/30/2020. FINDINGS: No focal consolidation, pleural effusion, or pneumothorax. The cardiac silhouette is within limits. No acute osseous pathology. Cervical spine ACDF. IMPRESSION: No active disease. Electronically Signed   By: Anner Crete M.D.   On: 01/11/2021 20:20   DG Hip Unilat W or Wo Pelvis 2-3 Views Left  Result  Date: 01/11/2021 CLINICAL DATA:  75 year old female with fall and left hip pain. EXAM: DG HIP (WITH OR WITHOUT PELVIS) 2-3V LEFT COMPARISON:  None. FINDINGS: There is a fracture of the left femoral neck with mild impaction. Prior right femoral ORIF. No dislocation. The bones are osteopenic. The soft tissues are unremarkable. IMPRESSION: Mildly impacted fracture of the left femoral neck. Electronically Signed   By: Anner Crete M.D.   On: 01/11/2021 20:18        Scheduled Meds: . amLODipine  5 mg Oral Daily  . atorvastatin  10 mg Oral Daily  . buPROPion  150 mg Oral Daily  . enoxaparin (LOVENOX) injection  40 mg Subcutaneous Q24H  . escitalopram  10 mg Oral Daily  . levothyroxine  88 mcg Oral QAC breakfast  . methocarbamol  750 mg Oral TID  . mometasone-formoterol  2 puff Inhalation BID  . montelukast  10 mg Oral Daily  . traZODone  100 mg Oral  QHS   Continuous Infusions:   LOS: 2 days    Time spent: 15 minutes    Sidney Ace, MD [facility] Pager [phone]  If 7PM-7AM, please contact night-coverage 01/13/2021, 2:10 PM

## 2021-01-13 NOTE — Anesthesia Preprocedure Evaluation (Addendum)
Anesthesia Evaluation  Patient identified by MRN, date of birth, ID band Patient awake    Reviewed: Allergy & Precautions, H&P , NPO status , Patient's Chart, lab work & pertinent test results, reviewed documented beta blocker date and time   History of Anesthesia Complications (+) DIFFICULT AIRWAY and history of anesthetic complications  Airway Mallampati: II  TM Distance: >3 FB Neck ROM: full    Dental  (+) Teeth Intact   Pulmonary shortness of breath, pneumonia, resolved, COPD, Current Smoker and Patient abstained from smoking.,    Pulmonary exam normal        Cardiovascular Exercise Tolerance: Poor + CAD and + Peripheral Vascular Disease  Normal cardiovascular exam+ dysrhythmias  Rhythm:regular Rate:Normal     Neuro/Psych PSYCHIATRIC DISORDERS Anxiety Depression  Neuromuscular disease    GI/Hepatic Neg liver ROS, hiatal hernia, GERD  Medicated,  Endo/Other  Hypothyroidism Hyperthyroidism   Renal/GU negative Renal ROS  negative genitourinary   Musculoskeletal   Abdominal   Peds  Hematology negative hematology ROS (+)   Anesthesia Other Findings Past Medical History: No date: Anxiety No date: CAD (coronary artery disease) No date: Carpal tunnel syndrome No date: Cataract No date: Complication of anesthesia     Comment:  has woken up at times No date: COPD (chronic obstructive pulmonary disease) ([place]) 09/14/2015: CRP elevated No date: Depression No date: Difficult intubation No date: Dyslipidemia No date: Dyspnea     Comment:  DOE No date: Dysrhythmia     Comment:  hx palpatations 09/14/2015: Elevated sedimentation rate No date: Esophageal spasm No date: Gastrointestinal parasites No date: GERD (gastroesophageal reflux disease) No date: Hiatal hernia No date: History of peptic ulcer disease No date: Hyperthyroidism No date: Hypothyroidism 09/14/2015: Low magnesium levels 2008: Pelvic fracture  ([place])     Comment:  fall from riding a horse No date: Reflux No date: Rotator cuff injury No date: Stenosis, spinal, lumbar   Reproductive/Obstetrics negative OB ROS                            Anesthesia Physical Anesthesia Plan  ASA: III  Anesthesia Plan: General ETT   Post-op Pain Management:    Induction:   PONV Risk Score and Plan:   Airway Management Planned:   Additional Equipment:   Intra-op Plan:   Post-operative Plan:   Informed Consent: I have reviewed the patients History and Physical, chart, labs and discussed the procedure including the risks, benefits and alternatives for the proposed anesthesia with the patient or authorized representative who has indicated his/her understanding and acceptance.     Dental Advisory Given  Plan Discussed with: CRNA  Anesthesia Plan Comments: (Refused RA.Marland Kitchen JA)        Anesthesia Quick Evaluation

## 2021-01-13 NOTE — Consult Note (Signed)
Pharmacy Antibiotic Note  Posey Jasmin is a 75 y.o. female with medical history including PAD, COPD, chronic respiratory failure, CAD admitted on 01/11/2021 with left femoral neck fracture. Pharmacy has been consulted for Unasyn dosing for aspiration pneumonia.  Plan: Unasyn 3 g IV q6h  Height: 5\' 4"  (162.6 cm) Weight: 54.4 kg (120 lb) IBW/kg (Calculated) : 54.7  Temp (24hrs), Avg:98.4 F (36.9 C), Min:98.1 F (36.7 C), Max:98.7 F (37.1 C)  Recent Labs  Lab 01/11/21 1842 01/12/21 0453 01/13/21 1208  WBC 9.5 9.1 14.0*  CREATININE 0.62 0.66 0.60    Estimated Creatinine Clearance: 53.8 mL/min (by C-G formula based on SCr of 0.6 mg/dL).    No Known Allergies  Antimicrobials this admission: Unasyn 2/24 >>   Dose adjustments this admission: N/A  Microbiology results: 2/22 SARS-CoV-2 / Influenza A & B: (-)  Thank you for allowing pharmacy to be a part of this patient's care.  Benita Gutter 01/13/2021 7:00 PM

## 2021-01-13 NOTE — Consult Note (Signed)
ORTHOPAEDIC CONSULTATION  REQUESTING PHYSICIAN: Sidney Ace, MD  Chief Complaint: left leg pain  HPI:75 y.o.femalewith medical history significant forPAD s/p mechanical thrombectomy and stent angioplasty of the right lower extremity and third right toe amputation, COPD, chronic respiratory failure with hypoxia on 2 L supplemental O2 via [place] at all times, CAD, hyperlipidemia, depression/anxiety, hard of hearingwho presents to the ED for evaluation of left hip pain after a fall.  Patient was recently hospitalized from 12/22/2020-12/31/2020 for gangrene of the third toe on the right foot. She underwent angiogram of RLE on 12/23/2020 with mechanical thrombectomy and catheter directed thrombolytic therapy to the right SFA, popliteal artery, and anterior tibial artery. S/p amputation of the third toe on the right foot 12/26/2020. She has been on aspirin and Eliquis. She was discharged to home with home health after SNF refusal.  Had a mechanical fall at home attributed to slick surgical bandages.  Minor head trauma but did not lose consciousness.  No other significant injuries.  Imaging on admission demonstrated a mildly impacted fracture of the left femoral neck  Orthopedics consult.     Past Medical History:  Diagnosis Date  . Anxiety   . CAD (coronary artery disease)   . Carpal tunnel syndrome   . Cataract   . Complication of anesthesia    has woken up at times  . COPD (chronic obstructive pulmonary disease) ([place])   . CRP elevated 09/14/2015  . Depression   . Difficult intubation   . Dyslipidemia   . Dyspnea    DOE  . Dysrhythmia    hx palpatations  . Elevated sedimentation rate 09/14/2015  . Esophageal spasm   . Gastrointestinal parasites   . GERD (gastroesophageal reflux disease)   . Hiatal hernia   . History of peptic ulcer disease   . Hyperthyroidism   . Hypothyroidism   . Low magnesium levels 09/14/2015  . Pelvic fracture ([place]) 2008   fall from riding a horse   . Reflux   . Rotator cuff injury   . Stenosis, spinal, lumbar    Past Surgical History:  Procedure Laterality Date  . AMPUTATION TOE Right 12/26/2020   Procedure: AMPUTATION TOE-3rd Toes;  Surgeon: Samara Deist, DPM;  Location: [facility];  Service: Podiatry;  Laterality: Right;  . APPENDECTOMY    . BACK SURGERY     CERVICAL FUSION  . CARPAL TUNNEL RELEASE    . CATARACT EXTRACTION W/PHACO Right 08/07/2017   Procedure: CATARACT EXTRACTION PHACO AND INTRAOCULAR LENS PLACEMENT (IOC);  Surgeon: Birder Robson, MD;  Location: [facility];  Service: Ophthalmology;  Laterality: Right;  [place] 00:52.0 AP% 16.8 CDE 8.74 Fluid Pack Lot # [id number]  . CATARACT EXTRACTION W/PHACO Left 09/04/2017   Procedure: CATARACT EXTRACTION PHACO AND INTRAOCULAR LENS PLACEMENT (IOC);  Surgeon: Birder Robson, MD;  Location: [facility];  Service: Ophthalmology;  Laterality: Left;  [place] 00:34 AP% 17.0 CDE 5.80 Fluid pack lot # [id number]  . CESAREAN SECTION    . CHOLECYSTECTOMY    . FRACTURE SURGERY    . HIP SURGERY    . INTRAMEDULLARY (IM) NAIL INTERTROCHANTERIC Right 10/01/2016   Procedure: INTRAMEDULLARY (IM) NAIL INTERTROCHANTRIC;  Surgeon: Corky Mull, MD;  Location: [facility];  Service: Orthopedics;  Laterality: Right;  . KYPHOPLASTY N/A 08/20/2018   Procedure: DGLOVFIEPPI-R5;  Surgeon: Hessie Knows, MD;  Location: [facility];  Service: Orthopedics;  Laterality: N/A;  . LOWER EXTREMITY ANGIOGRAPHY Right 12/18/2019   Procedure: LOWER EXTREMITY ANGIOGRAPHY;  Surgeon: Algernon Huxley, MD;  Location:  [place] [facility];  Service: Cardiovascular;  Laterality: Right;  . LOWER EXTREMITY ANGIOGRAPHY Right 12/02/2020   Procedure: LOWER EXTREMITY ANGIOGRAPHY;  Surgeon: Algernon Huxley, MD;  Location: [place] [facility];  Service: Cardiovascular;  Laterality: Right;  . LOWER EXTREMITY ANGIOGRAPHY Right 12/23/2020   Procedure: Lower Extremity Angiography;  Surgeon: Algernon Huxley, MD;  Location: [place] [facility];  Service:  Cardiovascular;  Laterality: Right;  . LOWER EXTREMITY ANGIOGRAPHY Right 12/24/2020   Procedure: Lower Extremity Angiography;  Surgeon: Algernon Huxley, MD;  Location: [place] [facility];  Service: Cardiovascular;  Laterality: Right;  . NECK SURGERY    . NOSE SURGERY    . ROTATOR CUFF REPAIR     x2  . SHOULDER ARTHROSCOPY  12/07/2011   Procedure: ARTHROSCOPY SHOULDER;  Surgeon: Ninetta Lights, MD;  Location: [place];  Service: Orthopedics;  Laterality: Right;  Debridement Partial Cuff Tear, Release Coracoacromial Ligament  . SHOULDER SURGERY  12/07/2011   right   Social History   Socioeconomic History  . Marital status: Divorced    Spouse name: Not on file  . Number of children: Not on file  . Years of education: Not on file  . Highest education level: Not on file  Occupational History  . Occupation: retired Air cabin crew  Tobacco Use  . Smoking status: Current Some Day Smoker    Packs/day: 0.00    Types: Cigarettes  . Smokeless tobacco: Never Used  . Tobacco comment: pt states 2 cigs per month   Substance and Sexual Activity  . Alcohol use: No  . Drug use: No  . Sexual activity: Never  Other Topics Concern  . Not on file  Social History Narrative  . Not on file   Social Determinants of Health   Financial Resource Strain: Not on file  Food Insecurity: Not on file  Transportation Needs: Not on file  Physical Activity: Not on file  Stress: Not on file  Social Connections: Not on file   Family History  Problem Relation Age of Onset  . Aneurysm Other    No Known Allergies Prior to Admission medications   Medication Sig Start Date End Date Taking? Authorizing Provider  ALPRAZolam Duanne Moron) 0.5 MG tablet Take 0.5 mg by mouth every 6 (six) hours as needed for anxiety.   Yes [provider]  amLODipine (NORVASC) 5 MG tablet Take 1 tablet (5 mg total) by mouth daily. 08/22/18  Yes Gladstone Lighter, MD  apixaban (ELIQUIS) 5 MG TABS tablet Take 1  tablet (5 mg total) by mouth 2 (two) times daily. 12/31/20 03/01/21 Yes Sreenath, Sudheer B, MD  aspirin EC 81 MG tablet Take 1 tablet (81 mg total) by mouth daily. 12/02/20  Yes Dew, Erskine Squibb, MD  atorvastatin (LIPITOR) 10 MG tablet Take 1 tablet (10 mg total) by mouth daily. 12/18/19 12/17/20 Yes Algernon Huxley, MD  buPROPion [facility] SR) 150 MG 12 hr tablet Take 150 mg by mouth daily.    Yes [provider]  escitalopram (LEXAPRO) 10 MG tablet Take 10 mg by mouth daily.  10/15/13  Yes [provider]  Fluticasone-Salmeterol (ADVAIR DISKUS) 250-50 MCG/DOSE AEPB Inhale 1 puff into the lungs 2 (two) times daily. 12/31/20 01/30/21 Yes Sreenath, Sudheer B, MD  levothyroxine (SYNTHROID, LEVOTHROID) 88 MCG tablet Take 88 mcg by mouth daily before breakfast.    Yes [provider]  montelukast (SINGULAIR) 10 MG tablet Take 10 mg by mouth daily.   Yes [provider]  nicotine (NICODERM CQ - DOSED IN MG/24 HOURS) 14 mg/24hr patch Place 1 patch (14 mg total) onto the skin daily. 12/31/20 03/01/21 Yes Sreenath, Sudheer B, MD  oxyCODONE (OXY IR/ROXICODONE) 5 MG immediate release tablet Take 1-2 tablets (5-10 mg total) by mouth every 6 (six) hours as needed for up to 10 days for severe pain. 12/31/20 01/10/21 Yes Sreenath, Sudheer B, MD  tizanidine (ZANAFLEX) 2 MG capsule Take 1 capsule (2 mg total) by mouth 3 (three) times daily as needed for muscle spasms. 11/03/20 02/01/21 Yes Milinda Pointer, MD  traZODone (DESYREL) 100 MG tablet Take 1 tablet (100 mg total) by mouth at bedtime. Patient taking differently: Take 200 mg by mouth at bedtime. 08/21/18  Yes Gladstone Lighter, MD   DG Chest 1 View  Result Date: 01/11/2021 CLINICAL DATA:  75 year old female with fall and left hip pain. EXAM: CHEST  1 VIEW COMPARISON:  Chest radiograph dated 12/30/2020. FINDINGS: No focal consolidation, pleural effusion, or pneumothorax. The cardiac silhouette is within limits. No acute osseous  pathology. Cervical spine ACDF. IMPRESSION: No active disease. Electronically Signed   By: Anner Crete M.D.   On: 01/11/2021 20:20   DG Hip Unilat W or Wo Pelvis 2-3 Views Left  Result Date: 01/11/2021 CLINICAL DATA:  75 year old female with fall and left hip pain. EXAM: DG HIP (WITH OR WITHOUT PELVIS) 2-3V LEFT COMPARISON:  None. FINDINGS: There is a fracture of the left femoral neck with mild impaction. Prior right femoral ORIF. No dislocation. The bones are osteopenic. The soft tissues are unremarkable. IMPRESSION: Mildly impacted fracture of the left femoral neck. Electronically Signed   By: Anner Crete M.D.   On: 01/11/2021 20:18    Positive ROS: All other systems have been reviewed and were otherwise negative with the exception of those mentioned in the HPI and as above.  Physical Exam: General: Alert, no acute distress Cardiovascular: No pedal edema Respiratory: No cyanosis, no use of accessory musculature GI: No organomegaly, abdomen is soft and non-tender Skin: No lesions in the area of chief complaint Neurologic: Sensation intact distally Psychiatric: Patient is competent for consent with normal mood and affect Lymphatic: No axillary or cervical lymphadenopathy  MUSCULOSKELETAL: Left lower extremity: Decreased power LLE, left leg shortened and externally rotated . Compartments soft. Good cap refill. Motor and sensory intact distally.  Assessment: Left femoral neck fracture  Plan: Plan for operative fixation Friday, 01/14/2021 given her anticoagulation status NPO after midnight    Carlynn Spry, PA-C    01/13/2021 7:36 AM

## 2021-01-13 NOTE — Progress Notes (Signed)
   01/12/21 1914  Vital Signs  Temp 98.1 F (36.7 C)  Temp Source Oral  Pulse Rate (!) 115  Pulse Rate Source Monitor  Resp 16  BP 121/68  MAP (mmHg) 82  BP Location Left Arm  BP Method Automatic  Patient Position (if appropriate) Lying  Oxygen Therapy  SpO2 97 %  O2 Device Nasal Cannula  O2 Flow Rate (L/min) 2 L/min  MEWS Score  MEWS Temp 0  MEWS Systolic 0  MEWS Pulse 2  MEWS RR 0  MEWS LOC 0  MEWS Score 2  MEWS Score Color Yellow  NP Sharion Settler notified, New orders for continuous telemetry implemented. Will continue to monitor.

## 2021-01-13 NOTE — Progress Notes (Signed)
Patients had an increase in HR provider notified, ct ordered, pt given pain meds, ct notified. Charge nurse aware

## 2021-01-14 ENCOUNTER — Encounter
Admission: EM | Disposition: A | Discharge status: Skilled Nursing Facility | Payer: Self-pay | Source: Home / Self Care | Attending: Internal Medicine

## 2021-01-14 ENCOUNTER — Inpatient Hospital Stay: Payer: Medicare HMO | Admitting: Anesthesiology

## 2021-01-14 ENCOUNTER — Inpatient Hospital Stay: Payer: Medicare HMO

## 2021-01-14 HISTORY — PX: INTRAMEDULLARY (IM) NAIL INTERTROCHANTERIC: SHX5875

## 2021-01-14 LAB — CBC WITH DIFFERENTIAL/PLATELET
Abs Immature Granulocytes: 0.09 10*3/uL — ABNORMAL HIGH (ref 0.00–0.07)
Basophils Absolute: 0 10*3/uL (ref 0.0–0.1)
Basophils Relative: 0 %
Eosinophils Absolute: 0 10*3/uL (ref 0.0–0.5)
Eosinophils Relative: 0 %
HCT: 28.6 % — ABNORMAL LOW (ref 36.0–46.0)
Hemoglobin: 8.8 g/dL — ABNORMAL LOW (ref 12.0–15.0)
Immature Granulocytes: 1 %
Lymphocytes Relative: 8 %
Lymphs Abs: 0.9 10*3/uL (ref 0.7–4.0)
MCH: 28.2 pg (ref 26.0–34.0)
MCHC: 30.8 g/dL (ref 30.0–36.0)
MCV: 91.7 fL (ref 80.0–100.0)
Monocytes Absolute: 0.9 10*3/uL (ref 0.1–1.0)
Monocytes Relative: 8 %
Neutro Abs: 9.8 10*3/uL — ABNORMAL HIGH (ref 1.7–7.7)
Neutrophils Relative %: 83 %
Platelets: 324 10*3/uL (ref 150–400)
RBC: 3.12 MIL/uL — ABNORMAL LOW (ref 3.87–5.11)
RDW: 14.9 % (ref 11.5–15.5)
WBC: 11.8 10*3/uL — ABNORMAL HIGH (ref 4.0–10.5)
nRBC: 0 % (ref 0.0–0.2)

## 2021-01-14 LAB — BASIC METABOLIC PANEL
Anion gap: 9 (ref 5–15)
BUN: 11 mg/dL (ref 8–23)
CO2: 27 mmol/L (ref 22–32)
Calcium: 8.4 mg/dL — ABNORMAL LOW (ref 8.9–10.3)
Chloride: 100 mmol/L (ref 98–111)
Creatinine, Ser: 0.48 mg/dL (ref 0.44–1.00)
GFR, Estimated: >60 mL/min (ref >60)
Glucose, Bld: 103 mg/dL — ABNORMAL HIGH (ref 70–99)
Potassium: 3.7 mmol/L (ref 3.5–5.1)
Sodium: 136 mmol/L (ref 135–145)

## 2021-01-14 LAB — PROTIME-INR
INR: 1.2 (ref 0.8–1.2)
Prothrombin Time: 14.4 seconds (ref 11.4–15.2)

## 2021-01-14 SURGERY — FIXATION, FRACTURE, INTERTROCHANTERIC, WITH INTRAMEDULLARY ROD
Anesthesia: General | Laterality: Left

## 2021-01-14 MED ORDER — TRANEXAMIC ACID 1000 MG/10ML IV SOLN
2000.0000 mg | INTRAVENOUS | Status: DC
  Filled 2021-01-14: qty 20

## 2021-01-14 MED ORDER — KETAMINE HCL 50 MG/5ML IJ SOSY
PREFILLED_SYRINGE | INTRAMUSCULAR | Status: AC
  Filled 2021-01-14: qty 5

## 2021-01-14 MED ORDER — FENTANYL CITRATE (PF) 100 MCG/2ML IJ SOLN
INTRAMUSCULAR | Status: AC
  Filled 2021-01-14: qty 2

## 2021-01-14 MED ORDER — ALPRAZOLAM 0.25 MG PO TABS
0.5000 mg | ORAL_TABLET | Freq: Three times a day (TID) | ORAL | Status: DC | PRN
  Administered 2021-01-15 – 2021-01-20 (×12): 0.5 mg via ORAL
  Filled 2021-01-14 (×2): qty 1
  Filled 2021-01-14: qty 2
  Filled 2021-01-14 (×9): qty 1

## 2021-01-14 MED ORDER — BUPIVACAINE-EPINEPHRINE (PF) 0.25% -1:200000 IJ SOLN
INTRAMUSCULAR | Status: AC
  Filled 2021-01-14: qty 30

## 2021-01-14 MED ORDER — FENTANYL CITRATE (PF) 100 MCG/2ML IJ SOLN
25.0000 ug | INTRAMUSCULAR | Status: DC | PRN
  Administered 2021-01-14 (×3): 25 ug via INTRAVENOUS

## 2021-01-14 MED ORDER — DEXAMETHASONE SODIUM PHOSPHATE 10 MG/ML IJ SOLN
INTRAMUSCULAR | Status: AC
  Filled 2021-01-14: qty 1

## 2021-01-14 MED ORDER — LACTATED RINGERS IV SOLN: INTRAVENOUS | Status: DC | PRN

## 2021-01-14 MED ORDER — SUCCINYLCHOLINE CHLORIDE 20 MG/ML IJ SOLN
INTRAMUSCULAR | Status: DC | PRN
  Administered 2021-01-14: 100 mg via INTRAVENOUS

## 2021-01-14 MED ORDER — OXYCODONE HCL 5 MG/5ML PO SOLN: 5.0000 mg | Freq: Once | ORAL | Status: DC | PRN

## 2021-01-14 MED ORDER — SODIUM CHLORIDE (PF) 0.9 % IJ SOLN
INTRAMUSCULAR | Status: AC
  Filled 2021-01-14: qty 50

## 2021-01-14 MED ORDER — DOCUSATE SODIUM 100 MG PO CAPS
100.0000 mg | ORAL_CAPSULE | Freq: Two times a day (BID) | ORAL | Status: DC
  Administered 2021-01-14 – 2021-01-20 (×12): 100 mg via ORAL
  Filled 2021-01-14 (×12): qty 1

## 2021-01-14 MED ORDER — BUPIVACAINE-EPINEPHRINE (PF) 0.25% -1:200000 IJ SOLN
INTRAMUSCULAR | Status: DC | PRN
  Administered 2021-01-14: 28 mL

## 2021-01-14 MED ORDER — FENTANYL CITRATE (PF) 100 MCG/2ML IJ SOLN
INTRAMUSCULAR | Status: DC | PRN
  Administered 2021-01-14 (×2): 50 ug via INTRAVENOUS

## 2021-01-14 MED ORDER — ONDANSETRON HCL 4 MG/2ML IJ SOLN: 4.0000 mg | Freq: Once | INTRAMUSCULAR | Status: DC | PRN

## 2021-01-14 MED ORDER — SODIUM CHLORIDE 0.9 % IV SOLN
INTRAVENOUS | Status: DC | PRN
  Administered 2021-01-14: 250 mL via INTRAVENOUS

## 2021-01-14 MED ORDER — ONDANSETRON HCL 4 MG PO TABS: 4.0000 mg | ORAL_TABLET | Freq: Four times a day (QID) | ORAL | Status: DC | PRN

## 2021-01-14 MED ORDER — ONDANSETRON HCL 4 MG/2ML IJ SOLN
INTRAMUSCULAR | Status: AC
  Filled 2021-01-14: qty 2

## 2021-01-14 MED ORDER — OXYCODONE HCL 5 MG PO TABS: 5.0000 mg | ORAL_TABLET | Freq: Once | ORAL | Status: DC | PRN

## 2021-01-14 MED ORDER — KETAMINE HCL 10 MG/ML IJ SOLN
INTRAMUSCULAR | Status: DC | PRN
  Administered 2021-01-14: 20 mg via INTRAVENOUS

## 2021-01-14 MED ORDER — PROPOFOL 10 MG/ML IV BOLUS
INTRAVENOUS | Status: AC
  Filled 2021-01-14: qty 20

## 2021-01-14 MED ORDER — METOCLOPRAMIDE HCL 5 MG/ML IJ SOLN: 5.0000 mg | Freq: Three times a day (TID) | INTRAMUSCULAR | Status: DC | PRN

## 2021-01-14 MED ORDER — METOCLOPRAMIDE HCL 10 MG PO TABS
5.0000 mg | ORAL_TABLET | Freq: Three times a day (TID) | ORAL | Status: DC | PRN
  Filled 2021-01-14: qty 1

## 2021-01-14 MED ORDER — DEXMEDETOMIDINE (PRECEDEX) IN NS 20 MCG/5ML (4 MCG/ML) IV SYRINGE
PREFILLED_SYRINGE | INTRAVENOUS | Status: DC | PRN
  Administered 2021-01-14: 4 ug via INTRAVENOUS
  Administered 2021-01-14: 8 ug via INTRAVENOUS

## 2021-01-14 MED ORDER — METOPROLOL TARTRATE 5 MG/5ML IV SOLN
5.0000 mg | Freq: Once | INTRAVENOUS | Status: AC
  Administered 2021-01-14: 5 mg via INTRAVENOUS
  Filled 2021-01-14: qty 5

## 2021-01-14 MED ORDER — ONDANSETRON HCL 4 MG/2ML IJ SOLN
INTRAMUSCULAR | Status: DC | PRN
  Administered 2021-01-14: 4 mg via INTRAVENOUS

## 2021-01-14 MED ORDER — LACTATED RINGERS IV SOLN
INTRAVENOUS | Status: DC
  Administered 2021-01-14: 1000 mL via INTRAVENOUS

## 2021-01-14 MED ORDER — NEOMYCIN-POLYMYXIN B GU 40-200000 IR SOLN
Status: AC
  Filled 2021-01-14: qty 4

## 2021-01-14 MED ORDER — DEXMEDETOMIDINE (PRECEDEX) IN NS 20 MCG/5ML (4 MCG/ML) IV SYRINGE
PREFILLED_SYRINGE | INTRAVENOUS | Status: AC
  Filled 2021-01-14: qty 5

## 2021-01-14 MED ORDER — PROPOFOL 10 MG/ML IV BOLUS
INTRAVENOUS | Status: DC | PRN
  Administered 2021-01-14: 100 mg via INTRAVENOUS

## 2021-01-14 MED ORDER — LABETALOL HCL 5 MG/ML IV SOLN: 10.0000 mg | INTRAVENOUS | Status: DC | PRN

## 2021-01-14 MED ORDER — ONDANSETRON HCL 4 MG/2ML IJ SOLN: 4.0000 mg | Freq: Four times a day (QID) | INTRAMUSCULAR | Status: DC | PRN

## 2021-01-14 MED ORDER — MORPHINE SULFATE (PF) 2 MG/ML IV SOLN
2.0000 mg | INTRAVENOUS | Status: DC | PRN
  Administered 2021-01-14 (×3): 2 mg via INTRAVENOUS
  Filled 2021-01-14 (×2): qty 1

## 2021-01-14 MED ORDER — SODIUM CHLORIDE (PF) 0.9 % IJ SOLN: INTRAMUSCULAR | Status: DC | PRN

## 2021-01-14 MED ORDER — DEXAMETHASONE SODIUM PHOSPHATE 10 MG/ML IJ SOLN
INTRAMUSCULAR | Status: DC | PRN
  Administered 2021-01-14: 5 mg via INTRAVENOUS

## 2021-01-14 MED ORDER — PHENYLEPHRINE HCL (PRESSORS) 10 MG/ML IV SOLN
INTRAVENOUS | Status: DC | PRN
  Administered 2021-01-14 (×5): 100 ug via INTRAVENOUS

## 2021-01-14 MED ORDER — SODIUM CHLORIDE 0.9 % IV SOLN
INTRAVENOUS | Status: DC | PRN
  Administered 2021-01-14: 30 ug/min via INTRAVENOUS

## 2021-01-14 MED ORDER — LIDOCAINE HCL (CARDIAC) PF 100 MG/5ML IV SOSY
PREFILLED_SYRINGE | INTRAVENOUS | Status: DC | PRN
  Administered 2021-01-14: 80 mg via INTRAVENOUS

## 2021-01-14 SURGICAL SUPPLY — 42 items
APL PRP STRL LF DISP 70% ISPRP (MISCELLANEOUS) ×1
BIT DRILL SHORT 4.2 (BIT) IMPLANT
BLADE HELICAL TFNA 85 (Anchor) ×1 IMPLANT
BNDG COHESIVE 4X5 TAN STRL (GAUZE/BANDAGES/DRESSINGS) IMPLANT
BRUSH SCRUB EZ  4% CHG (MISCELLANEOUS) ×4
BRUSH SCRUB EZ 4% CHG (MISCELLANEOUS) ×2 IMPLANT
CANISTER SUCT 1200ML W/VALVE (MISCELLANEOUS) ×2 IMPLANT
CHLORAPREP W/TINT 26 (MISCELLANEOUS) ×2 IMPLANT
COVER WAND RF STERILE (DRAPES) ×1 IMPLANT
DRAPE 3/4 80X56 (DRAPES) ×2 IMPLANT
DRAPE U-SHAPE 47X51 STRL (DRAPES) ×2 IMPLANT
DRESSING AQUACEL AG SP 3.5X10 (GAUZE/BANDAGES/DRESSINGS) IMPLANT
DRILL BIT SHORT 4.2 (BIT) ×2
DRSG AQUACEL AG ADV 3.5X 4 (GAUZE/BANDAGES/DRESSINGS) ×2 IMPLANT
DRSG AQUACEL AG ADV 3.5X10 (GAUZE/BANDAGES/DRESSINGS) ×2 IMPLANT
DRSG AQUACEL AG SP 3.5X10 (GAUZE/BANDAGES/DRESSINGS) ×2
ELECT REM PT RETURN 9FT ADLT (ELECTROSURGICAL) ×2
ELECTRODE REM PT RTRN 9FT ADLT (ELECTROSURGICAL) ×1 IMPLANT
GAUZE XEROFORM 1X8 LF (GAUZE/BANDAGES/DRESSINGS) ×2 IMPLANT
GLOVE INDICATOR 8.0 STRL GRN (GLOVE) ×2 IMPLANT
GLOVE SURG ORTHO LTX SZ8 (GLOVE) ×2 IMPLANT
GOWN STRL REUS W/ TWL LRG LVL3 (GOWN DISPOSABLE) ×1 IMPLANT
GOWN STRL REUS W/ TWL XL LVL3 (GOWN DISPOSABLE) ×1 IMPLANT
GOWN STRL REUS W/TWL LRG LVL3 (GOWN DISPOSABLE) ×2
GOWN STRL REUS W/TWL XL LVL3 (GOWN DISPOSABLE) ×2
GUIDEWIRE 3.2X400 (WIRE) ×1 IMPLANT
KIT PATIENT CARE HANA TABLE (KITS) ×2 IMPLANT
KIT TURNOVER CYSTO (KITS) ×2 IMPLANT
MANIFOLD NEPTUNE II (INSTRUMENTS) ×2 IMPLANT
MAT ABSORB  FLUID 56X50 GRAY (MISCELLANEOUS) ×2
MAT ABSORB FLUID 56X50 GRAY (MISCELLANEOUS) ×1 IMPLANT
NAIL 9MM/130 TI CANN TFNA 340 (Nail) ×1 IMPLANT
NDL SPNL 20GX3.5 QUINCKE YW (NEEDLE) ×1 IMPLANT
NEEDLE SPNL 20GX3.5 QUINCKE YW (NEEDLE) ×2 IMPLANT
NS IRRIG 1000ML POUR BTL (IV SOLUTION) ×2 IMPLANT
PACK HIP COMPR (MISCELLANEOUS) ×2 IMPLANT
STAPLER SKIN PROX 35W (STAPLE) ×2 IMPLANT
SUT VIC AB 0 CT1 36 (SUTURE) ×2 IMPLANT
SUT VIC AB 2-0 CT1 27 (SUTURE) ×4
SUT VIC AB 2-0 CT1 TAPERPNT 27 (SUTURE) ×2 IMPLANT
SYR 30ML LL (SYRINGE) ×2 IMPLANT
TOWEL OR 17X26 4PK STRL BLUE (TOWEL DISPOSABLE) ×2 IMPLANT

## 2021-01-14 NOTE — Anesthesia Postprocedure Evaluation (Signed)
Anesthesia Post Note  Patient: [name]  Procedure(s) Performed: INTRAMEDULLARY (IM) NAIL INTERTROCHANTRIC (Left )  Patient location during evaluation: PACU Anesthesia Type: General Level of consciousness: awake and alert Pain management: pain level controlled Vital Signs Assessment: post-procedure vital signs reviewed and stable Respiratory status: spontaneous breathing, nonlabored ventilation and respiratory function stable Cardiovascular status: blood pressure returned to baseline and stable Postop Assessment: no apparent nausea or vomiting Anesthetic complications: no   No complications documented.   Last Vitals:  Vitals:   01/14/21 1651 01/14/21 1721  BP: (!) 106/59 114/71  Pulse: (!) 118 (!) 118  Resp:  17  Temp: 37.1 C 36.8 C  SpO2: 93% 95%    Last Pain:  Vitals:   01/14/21 1651  TempSrc: Oral  PainSc:                  Tera Mater

## 2021-01-14 NOTE — H&P (Signed)
The patient has been re-examined, and the chart reviewed, and there have been no interval changes to the documented history and physical.  Plan a left trochanteric femoral nail today.  Anesthesia is not consulted regarding a peripheral nerve block for post-operative pain.  The risks, benefits, and alternatives have been discussed at length, and the patient is willing to proceed.    

## 2021-01-14 NOTE — Anesthesia Procedure Notes (Signed)
Procedure Name: Intubation Date/Time: 01/14/2021 2:40 PM Performed by: Jerrye Noble, CRNA Pre-anesthesia Checklist: Patient identified, Emergency Drugs available, Suction available and Patient being monitored Patient Re-evaluated:Patient Re-evaluated prior to induction Oxygen Delivery Method: Circle system utilized Preoxygenation: Pre-oxygenation with 100% oxygen Induction Type: IV induction Ventilation: Mask ventilation without difficulty Laryngoscope Size: McGraph and 3 Grade View: Grade I Tube type: Oral Tube size: 7.0 mm Number of attempts: 1 Airway Equipment and Method: Stylet and Video-laryngoscopy Placement Confirmation: ETT inserted through vocal cords under direct vision,  positive ETCO2 and breath sounds checked- equal and bilateral Secured at: 21 cm Tube secured with: Tape Dental Injury: Teeth and Oropharynx as per pre-operative assessment

## 2021-01-14 NOTE — Significant Event (Addendum)
Rapid Response Event Note   Reason for Call : called for red mews upon arrival from PACU... by the time I arrived, VS rechecked and MEWS yellow. Pt at baseline. Cancel RRT per pts nurse Elmyra Ricks.   Initial Focused Assessment:       Interventions:    Plan of Care:     Event Summary:   MD Notified:  Call [id number] Arrival [id number] End [id number]  Clayton,Katherine A, RN

## 2021-01-14 NOTE — Transfer of Care (Signed)
Immediate Anesthesia Transfer of Care Note  Patient: [name]  Procedure(s) Performed: INTRAMEDULLARY (IM) NAIL INTERTROCHANTRIC (Left )  Patient Location: PACU  Anesthesia Type:General  Level of Consciousness: awake, alert  and oriented  Airway & Oxygen Therapy: Patient Spontanous Breathing and Patient connected to face mask oxygen  Post-op Assessment: Report given to RN and Post -op Vital signs reviewed and stable  Post vital signs: Reviewed and stable  Last Vitals:  Vitals Value Taken Time  BP 109/49 01/14/21 1545  Temp    Pulse 117 01/14/21 1549  Resp 25 01/14/21 1549  SpO2 100 % 01/14/21 1549  Vitals shown include unvalidated device data.  Last Pain:  Vitals:   01/14/21 1236  TempSrc:   PainSc: 3       Patients Stated Pain Goal: 10 (36/46/80 3212)  Complications: No complications documented.

## 2021-01-14 NOTE — Progress Notes (Signed)
PROGRESS NOTE    [name]  WCB:[id number] DOB: [birth year] DOA: 01/11/2021 PCP: Albina Billet, MD  Brief Narrative:  75 y.o. female with medical history significant for PAD s/p mechanical thrombectomy and stent angioplasty of the right lower extremity and third right toe amputation, COPD, chronic respiratory failure with hypoxia on 2 L supplemental O2 via [place] at all times, CAD, hyperlipidemia, depression/anxiety, hard of hearing who presents to the ED for evaluation of left hip pain after a fall.  Patient was recently hospitalized from 12/22/2020-12/31/2020 for gangrene of the third toe on the right foot.  She underwent angiogram of RLE on 12/23/2020 with mechanical thrombectomy and catheter directed thrombolytic therapy to the right SFA, popliteal artery, and anterior tibial artery.  S/p amputation of the third toe on the right foot 12/26/2020.  She has been on aspirin and Eliquis.  She was discharged to home with home health after SNF refusal.  Had a mechanical fall at home attributed to slick surgical bandages.  Minor head trauma but did not lose consciousness.  No other significant injuries.  Imaging on admission demonstrated a mildly impacted fracture of the left femoral neck  Orthopedics on consult.  Plan for operative fixation Friday, 01/14/2021.  Patient had acute onset of sinus tachycardia on 2/24.  Given concern for PE in the setting of held anticoagulation pursued CTA thorax.  No PE identified however there was evidence of a right basilar pneumonia, suspicious for aspiration.  Assessment & Plan:   Principal Problem:   Fracture of femoral neck, left, closed (HCC) Active Problems:   HLD (hyperlipidemia)   Hypothyroidism   Anxiety and depression   Hypomagnesemia   Chronic obstructive pulmonary disease (COPD) (HCC)   PAD (peripheral artery disease) (HCC)   Chronic respiratory failure with hypoxia (HCC)   Hypokalemia  Right base pneumonia, likely aspiration Patient had acute onset of  tachycardia on 2/24 CT thorax revealed above Patient hemodynamically stable, afebrile Started on Unasyn Tachycardia improved Plan: Continue Unasyn Plan for 5-day antibiotic course  Left femoral neck fracture Status post chemical fall Patient unsure but likely last dose of Eliquis was 2/21 Orthopedics on consult Plan: N.p.o. for or today Multimodal pain control Avoid IV narcotics if able Eliquis on hold Hold a.m. chemoprophylaxis Start PT OT and chemoprophylaxis postop day 1  COPD/chronic respiratory failure with hypoxia: Chronic and stable on home 2 L of home O2 via [place].   Continue Advair, Singulair, albuterol as needed.  Hypokalemia/hypomagnesemia: Supplement with IV magnesium followed by oral potassium.  PAD: S/p recent admission with angiogram of RLE on 12/23/2020 with mechanical thrombectomy and catheter directed thrombolytic therapy to the right SFA, popliteal artery, and anterior tibial artery.  S/p amputation of the third toe on the right foot 12/26/2020. -Holding Eliquis and aspirin as above -Continue atorvastatin  Hypertension: Continue amlodipine.  Hypothyroidism: Continue Synthroid.  Hyperlipidemia: Atorvastatin.  Anxiety/depression: Continue Wellbutrin and Lexapro.    DVT prophylaxis: Lovenox Code Status: Full Family Communication: Attempted to call Raquel Sarna, daughter-in-law at [phone] Disposition Plan: Status is: Inpatient  Remains inpatient appropriate because:Inpatient level of care appropriate due to severity of illness   Dispo: The patient is from: Home              Anticipated d/c is to: SNF              Patient currently is not medically stable to d/c.   Difficult to place patient No   Hip fracture, operative repair today  Level of care: Med-Surg  Consultants:   Orthopedics  Procedures:   None  Antimicrobials:   None   Subjective: Patient seen and examined.  Continues to endorse pain in the leg.  No  other issues or complaints.  Objective: Vitals:   01/14/21 0233 01/14/21 0820 01/14/21 1118 01/14/21 1236  BP: (!) 110/54 127/81 108/67 (!) 120/105  Pulse: (!) 103 (!) 121 (!) 108 (!) 113  Resp: 16 18 17 12   Temp: 97.8 F (36.6 C) 98.1 F (36.7 C) 98.3 F (36.8 C)   TempSrc: Axillary     SpO2: 98% 94% 98% 94%  Weight:      Height:        Intake/Output Summary (Last 24 hours) at 01/14/2021 1248 Last data filed at 01/14/2021 0600 Gross per 24 hour  Intake 610 ml  Output 450 ml  Net 160 ml   Filed Weights   01/11/21 1842  Weight: 54.4 kg    Examination:  General exam: Appears calm and comfortable  Respiratory system: Clear to auscultation. Respiratory effort normal. Cardiovascular system: S1 & S2 heard, RRR. No JVD, murmurs, rubs, gallops or clicks. No pedal edema. Gastrointestinal system: Abdomen is nondistended, soft and nontender. No organomegaly or masses felt. Normal bowel sounds heard. Central nervous system: Alert and oriented. No focal neurological deficits. Extremities: Decreased power LLE, left leg shortened and externally rotated Skin: R distal second toe with dry necrotic appearance.  S/p right third toe amputation Psychiatry: Judgement and insight appear normal. Mood & affect appropriate.     Data Reviewed: I have personally reviewed following labs and imaging studies  CBC: Recent Labs  Lab 01/11/21 1842 01/12/21 0453 01/13/21 1208 01/14/21 0729  WBC 9.5 9.1 14.0* 11.8*  NEUTROABS 7.3  --   --  9.8*  HGB 11.4* 9.6* 9.9* 8.8*  HCT 34.9* 29.9* 31.7* 28.6*  MCV 88.6 89.3 91.4 91.7  PLT 389 287 264 161   Basic Metabolic Panel: Recent Labs  Lab 01/11/21 1842 01/12/21 0453 01/13/21 1208 01/14/21 0729  NA 136 137 136 136  K 3.2* 2.8* 3.9 3.7  CL 100 101 100 100  CO2 25 30 25 27   GLUCOSE 114* 134* 102* 103*  BUN 13 14 10 11   CREATININE 0.62 0.66 0.60 0.48  CALCIUM 8.6* 8.2* 8.5* 8.4*  MG 1.6* 2.3  --   --    GFR: Estimated Creatinine  Clearance: 53.8 mL/min (by C-G formula based on SCr of 0.48 mg/dL). Liver Function Tests: No results for input(s): AST, ALT, ALKPHOS, BILITOT, PROT, ALBUMIN in the last 168 hours. No results for input(s): LIPASE, AMYLASE in the last 168 hours. No results for input(s): AMMONIA in the last 168 hours. Coagulation Profile: Recent Labs  Lab 01/11/21 1958 01/13/21 0816 01/14/21 0729  INR 1.2 1.1 1.2   Cardiac Enzymes: No results for input(s): CKTOTAL, CKMB, CKMBINDEX, TROPONINI in the last 168 hours. BNP (last 3 results) No results for input(s): PROBNP in the last 8760 hours. HbA1C: No results for input(s): HGBA1C in the last 72 hours. CBG: No results for input(s): GLUCAP in the last 168 hours. Lipid Profile: No results for input(s): CHOL, HDL, LDLCALC, TRIG, CHOLHDL, LDLDIRECT in the last 72 hours. Thyroid Function Tests: No results for input(s): TSH, T4TOTAL, FREET4, T3FREE, THYROIDAB in the last 72 hours. Anemia Panel: No results for input(s): VITAMINB12, FOLATE, FERRITIN, TIBC, IRON, RETICCTPCT in the last 72 hours. Sepsis Labs: Recent Labs  Lab 01/13/21 1208  PROCALCITON 0.36    Recent Results (from  the past 240 hour(s))  Resp Panel by RT-PCR (Flu A&B, Covid) Nasopharyngeal Swab     Status: None   Collection Time: 01/11/21  8:35 PM   Specimen: Nasopharyngeal Swab; Nasopharyngeal(NP) swabs in vial transport medium  Result Value Ref Range Status   SARS Coronavirus 2 by RT PCR NEGATIVE NEGATIVE Final    Comment: (NOTE) SARS-CoV-2 target nucleic acids are NOT DETECTED.  The SARS-CoV-2 RNA is generally detectable in upper respiratory specimens during the acute phase of infection. The lowest concentration of SARS-CoV-2 viral copies this assay can detect is 138 copies/mL. A negative result does not preclude SARS-Cov-2 infection and should not be used as the sole basis for treatment or other patient management decisions. A negative result may occur with  improper specimen  collection/handling, submission of specimen other than nasopharyngeal swab, presence of viral mutation(s) within the areas targeted by this assay, and inadequate number of viral copies(<138 copies/mL). A negative result must be combined with clinical observations, patient history, and epidemiological information. The expected result is Negative.  Fact Sheet for Patients:  EntrepreneurPulse.com.au  Fact Sheet for Healthcare Providers:  IncredibleEmployment.be  This test is no t yet approved or cleared by the [place] FDA and  has been authorized for detection and/or diagnosis of SARS-CoV-2 by FDA under an Emergency Use Authorization (EUA). This EUA will remain  in effect (meaning this test can be used) for the duration of the COVID-19 declaration under Section 564(b)(1) of the Act, 21 U.S.C.section 360bbb-3(b)(1), unless the authorization is terminated  or revoked sooner.       Influenza A by PCR NEGATIVE NEGATIVE Final   Influenza B by PCR NEGATIVE NEGATIVE Final    Comment: (NOTE) The Xpert Xpress SARS-CoV-2/FLU/RSV plus assay is intended as an aid in the diagnosis of influenza from Nasopharyngeal swab specimens and should not be used as a sole basis for treatment. Nasal washings and aspirates are unacceptable for Xpert Xpress SARS-CoV-2/FLU/RSV testing.  Fact Sheet for Patients: EntrepreneurPulse.com.au  Fact Sheet for Healthcare Providers: IncredibleEmployment.be  This test is not yet approved or cleared by the [place] FDA and has been authorized for detection and/or diagnosis of SARS-CoV-2 by FDA under an Emergency Use Authorization (EUA). This EUA will remain in effect (meaning this test can be used) for the duration of the COVID-19 declaration under Section 564(b)(1) of the Act, 21 U.S.C. section 360bbb-3(b)(1), unless the authorization is terminated or revoked.  Performed at [facility], [address] [place], [place] [address]          Radiology Studies: CT ANGIO CHEST PE W OR WO CONTRAST  Result Date: 01/13/2021 CLINICAL DATA:  Recent right lower extremity angiogram and thrombo lytic therapy. Recent hip fracture. PE suspected. EXAM: CT ANGIOGRAPHY CHEST WITH CONTRAST TECHNIQUE: Multidetector CT imaging of the chest was performed using the standard protocol during bolus administration of intravenous contrast. Multiplanar CT image reconstructions and MIPs were obtained to evaluate the vascular anatomy. CONTRAST:  23mL OMNIPAQUE IOHEXOL 350 MG/ML SOLN COMPARISON:  Chest radiograph 01/11/2021.  CTA chest 10/21/2018. FINDINGS: Cardiovascular: The quality of this exam for evaluation of pulmonary embolism is good. Minimal motion degradation. The bolus is well timed. No evidence of pulmonary embolism. Aortic atherosclerosis. Normal heart size, without pericardial effusion. Lad coronary artery calcification. Mediastinum/Nodes: No supraclavicular adenopathy. Precarinal node of 1.4 cm is similar to on the prior exam. Subcarinal node measures 1.5 cm on 45/4, similar (when remeasured). Right hilar adenopathy at 1.7 cm on 44/4, 1.6 cm previously.  Small hiatal hernia. Distal esophageal wall thickening is moderate, similar to slightly increased. Lungs/Pleura: Trace bilateral pleural thickening. Moderate centrilobular emphysema. Lower lobe predominant bronchial wall thickening. Fluid or secretions in the right lower lobe endobronchial tree on 64/6. Minimal left base dependent atelectasis. Right base airspace disease. Upper Abdomen: Cholecystectomy. Scattered subcentimeter hyperenhancing foci within the liver are nonspecific, not readily apparent on the prior. Example in the right hepatic lobe on 83/4 and 92/4. Intrahepatic biliary duct dilatation is moderate and not significantly changed. Common duct measures 10 mm in the pancreatic head versus 8 mm at the same level on the prior.  Pancreatic duct is borderline dilated, felt to be similar. Normal imaged portions of the spleen, stomach, right kidney. Upper pole left renal 3.1 cm cyst. Left adrenal thickening and nodularity is similar likely relates to hyperplasia. The right adrenal gland is minimally thickened. Musculoskeletal: Lower cervical spine fixation. Remote right rib fractures. Loss of vertebral body height at multiple thoracic levels without ventral canal encroachment. Felt to be similar. Review of the MIP images confirms the above findings. IMPRESSION: 1. No evidence of pulmonary embolism. 2. Right lower lobe airspace disease, suspicious for aspiration, especially given fluid/secretions in the right lower lobe endobronchial tree. 3. Similar to minimal increase in thoracic adenopathy, favored to be reactive. 4. Small hiatal hernia. Distal esophageal wall thickening suggests esophagitis. 5. Similar intrahepatic and similar to minimally increased common duct dilatation. Correlate with bilirubin levels. If elevated, consider MRCP. 6. Scattered subcentimeter hyperenhancing foci within the liver are not readily apparent on the prior. Most likely perfusion anomalies. If any history of primary malignancy or liver disease, consider outpatient pre and post contrast abdominal MRI. 7. Aortic Atherosclerosis (ICD10-I70.0) and Emphysema (ICD10-J43.9). Coronary artery atherosclerosis. Electronically Signed   By: Abigail Miyamoto M.D.   On: 01/13/2021 18:58        Scheduled Meds: . [MAR Hold] amLODipine  5 mg Oral Daily  . [MAR Hold] atorvastatin  10 mg Oral Daily  . [MAR Hold] buPROPion  150 mg Oral Daily  . enoxaparin (LOVENOX) injection  40 mg Subcutaneous Q24H  . [MAR Hold] escitalopram  10 mg Oral Daily  . [MAR Hold] levothyroxine  88 mcg Oral QAC breakfast  . [MAR Hold] methocarbamol  750 mg Oral TID  . [MAR Hold] mometasone-formoterol  2 puff Inhalation BID  . [MAR Hold] montelukast  10 mg Oral Daily  . [MAR Hold] tranexamic acid  (CYKLOKAPRON) topical - INTRAOP  2,000 mg Topical 30 min Pre-Op  . [MAR Hold] traZODone  100 mg Oral QHS   Continuous Infusions: . [MAR Hold] sodium chloride 250 mL (01/14/21 0858)  . [MAR Hold] ampicillin-sulbactam (UNASYN) IV 3 g (01/14/21 0900)     LOS: 3 days    Time spent: 25 minutes    Sidney Ace, MD [facility] Pager [phone]  If 7PM-7AM, please contact night-coverage 01/14/2021, 12:48 PM

## 2021-01-14 NOTE — Op Note (Signed)
DATE OF SURGERY:  01/14/2021  TIME: 3:39 PM  PATIENT NAME:  [name]  AGE: 75 y.o.  PRE-OPERATIVE DIAGNOSIS:  Left Femur Gracelyn Nurse  POST-OPERATIVE DIAGNOSIS:  SAME  PROCEDURE:  LEFT INTRAMEDULLARY (IM) NAIL INTERTROCHANTRIC  SURGEON:  Lovell Sheehan  EBL:  50 cc  COMPLICATIONS:  None apparent  OPERATIVE IMPLANTS: Synthes trochanteric femoral nail  340 mm  with interlocking helical blade  85 mm  PREOPERATIVE INDICATIONS:  [name] is a 75 y.o. year old who fell and suffered a hip fracture. She was brought into the ER and then admitted and optimized and then elected for surgical intervention.    The risks benefits and alternatives were discussed with the patient including but not limited to the risks of nonoperative treatment, versus surgical intervention including infection, bleeding, nerve injury, malunion, nonunion, hardware prominence, hardware failure, need for hardware removal, blood clots, cardiopulmonary complications, morbidity, mortality, among others, and they were willing to proceed.    OPERATIVE PROCEDURE:  The patient was brought to the operating room and placed in the supine position.  General anesthesia was administered, with a foley. She was placed on the fracture table.  Closed reduction was performed under C-arm guidance. The length of the femur was also measured using fluoroscopy. Time out was then performed after sterile prep and drape. She received preoperative antibiotics.  Incision was made proximal to the greater trochanter. A guidewire was placed in the appropriate position. Confirmation was made on AP and lateral views. The above-named nail was opened. I opened the proximal femur with a reamer. I then placed the nail by hand easily down. I did not need to ream the femur.  Once the nail was completely seated, I placed a guidepin into the femoral head into the center center position through a second incision.  I measured the length, and then reamed the  lateral cortex and up into the head. I then placed the helical blade. Slight compression was applied. Anatomic fixation achieved. Bone quality was poor.  I then secured the proximal interlock.  I then removed the instruments, and took final C-arm pictures AP and lateral the entire length of the leg. Anatomic reconstruction was achieved, and the wounds were irrigated copiously and closed with Vicryl  followed by staples and dry sterile dressing. Sponge and needle count were correct.   The patient was awakened and returned to PACU in stable and satisfactory condition. There no complications and the patient tolerated the procedure well.  She will be weightbearing as tolerated.    Lovell Sheehan

## 2021-01-14 NOTE — TOC Initial Note (Signed)
Transition of Care [facility]) - Initial/Assessment Note    Patient Details  Name: [name] MRN: [id number] Date of Birth: [birth year]  Transition of Care [facility]) CM/SW Contact:    Candie Chroman, LCSW Phone Number: 01/14/2021, 8:51 AM  Clinical Narrative: Readmission prevention screen completed by [facility] coworker on 2/10:  "Patient states that she lives at home with her dog and daughter in law  Has private caregiver named Kennyth Lose 3-4 hours a day 5 days a week  Patient states that here PCP [facility] [place] [facility]-  Denies issues obtaining medications.   Patient states that she has a RW and cane in the home."                 Expected Discharge Plan: [place] Barriers to Discharge: Continued Medical Work up   Patient Goals and CMS Choice        Expected Discharge Plan and Services Expected Discharge Plan: [place]       Living arrangements for the past 2 months: Single Family Home                                      Prior Living Arrangements/Services Living arrangements for the past 2 months: Single Family Home Lives with:: Pets Patient language and need for interpreter reviewed:: Yes Do you feel safe going back to the place where you live?: Yes      Need for Family Participation in Patient Care: Yes (Comment) Care giver support system in place?: Yes (comment) Current home services: DME,Home PT,Home RN Criminal Activity/Legal Involvement Pertinent to Current Situation/Hospitalization: No - Comment as needed  Activities of Daily Living Home Assistive Devices/Equipment: Dentures (specify type),Eyeglasses ADL Screening (condition at time of admission) Patient's cognitive ability adequate to safely complete daily activities?: Yes Is the patient deaf or have difficulty hearing?: Yes Does the patient have difficulty seeing, even when wearing glasses/contacts?: Yes Does the patient have difficulty concentrating, remembering, or making  decisions?: No Patient able to express need for assistance with ADLs?: Yes Does the patient have difficulty dressing or bathing?: Yes Independently performs ADLs?: No Communication: Dependent Dressing (OT): Dependent Is this a change from baseline?: Pre-admission baseline Grooming: Needs assistance Is this a change from baseline?: Change from baseline, expected to last >3 days Feeding: Independent Bathing: Dependent Is this a change from baseline?: Change from baseline, expected to last >3 days Toileting: Needs assistance Is this a change from baseline?: Change from baseline, expected to last >3days In/Out Bed: Needs assistance,Dependent Is this a change from baseline?: Change from baseline, expected to last >3 days Does the patient have difficulty walking or climbing stairs?: Yes Weakness of Legs: Left Weakness of Arms/Hands: None  Permission Sought/Granted                  Emotional Assessment Appearance:: Appears stated age Attitude/Demeanor/Rapport: Unable to Assess Affect (typically observed): Unable to Assess Orientation: : Oriented to Self,Oriented to Place,Oriented to  Time,Oriented to Situation Alcohol / Substance Use: Not Applicable Psych Involvement: No (comment)  Admission diagnosis:  Pre-op chest exam [H08.657] Fracture of femoral neck, left, closed ([place]) [S72.002A] Closed fracture of left hip, initial encounter [facility]) [S72.002A] Patient Active Problem List   Diagnosis Date Noted  . Fracture of femoral neck, left, closed ([place]) 01/11/2021  . Chronic respiratory failure with hypoxia ([place]) 01/11/2021  . Hypokalemia 01/11/2021  . Osteomyelitis of third toe of  right foot ([place]) 12/22/2020  . PAD (peripheral artery disease) ([place]) 12/22/2020  . Encounter for long-term opiate analgesic use 11/11/2020  . Dry gangrene ([place]) of middle toe, right foot 11/11/2020  . Foot pain, right 11/11/2020  . Hard of hearing 11/11/2020  . Atherosclerosis of native arteries of the  extremities with gangrene ([place]) 12/09/2019  . Vitamin D deficiency 11/12/2019  . Pharmacologic therapy 06/04/2019  . Disorder of skeletal system 06/04/2019  . Problems influencing health status 06/04/2019  . Sepsis ([place]) 10/22/2018  . HCAP (healthcare-associated pneumonia) 10/22/2018  . Compression fracture of L3 vertebra ([place]) 08/19/2018  . Acute UTI 08/18/2018  . Chronic hip pain (Bilateral) 12/31/2017  . Neurogenic pain 08/29/2017  . Chronic low back pain (1ry area of Pain) (Right) w/o sciatica 08/29/2017  . Chronic sacroiliac joint pain (Right) 06/05/2017  . Vitamin D insufficiency 03/12/2017  . Right hip pain 02/20/2017  . Intertrochanteric fracture of right hip, sequela 02/20/2017  . B12 deficiency 02/05/2017  . Pressure injury of skin 10/02/2016  . Closed displaced intertrochanteric fracture of right femur ([place]) 10/02/2016  . Hip fracture ([place]) 10/01/2016  . Cervical facet hypertrophy (Bilateral) 05/27/2016  . History of shoulder surgery 5 (Right) 05/27/2016  . Lumbar foraminal stenosis (L3-4) (Left) 05/27/2016  . 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 Lumbar facet hypertrophy (Bilateral) 05/27/2016  . Lumbar facet syndrome (Right) 05/27/2016  . Lumbar grade 1 Anterolisthesis of L3 over L4 05/27/2016  . Chronic shoulder pain (Bilateral) (status post multiple surgeries) (R>L) 12/08/2015  . Substance use disorder Risk: High 09/14/2015  . Chronic pain syndrome 09/14/2015  . Cervical spondylosis 09/14/2015  . Chronic neck pain (2ry area of Pain) (Bilateral) (R>L) 09/14/2015  . 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 Cervical facet syndrome (Bilateral) (R>L) 09/14/2015  . Cervical myofascial pain syndrome 09/14/2015  . Lumbar spondylosis 09/14/2015  . Chronic shoulder impingement syndrome (Right) 09/14/2015  . Hypomagnesemia 09/14/2015  . CRP elevated 09/14/2015  . Elevated sedimentation rate 09/14/2015  .  Chronic obstructive pulmonary disease (COPD) ([place]) 09/14/2015  . Nicotine dependence 09/14/2015  . Chronic shoulder pain (Right) 09/14/2015  . Abnormal nerve conduction studies 09/14/2015  . Encounter for therapeutic drug level monitoring 09/09/2015  . Long term current use of opiate analgesic 09/09/2015  . Long term prescription opiate use 09/09/2015  . Uncomplicated opioid dependence ([place]) 09/09/2015  . Opiate use 09/09/2015  . Anxiety and depression 07/11/2012  . Tobacco use disorder 05/27/2011  . Fatigue 05/27/2011  . Hypothyroidism 11/25/2010  . HLD (hyperlipidemia) 08/24/2010  . Tachycardia 08/24/2010  . DYSPNEA 08/24/2010   PCP:  Albina Billet, MD Pharmacy:   [facility] [place] [address] Phone: [phone] Fax: [phone]  [id number] [place] [address], [address] [place] [address] Phone: [phone] Fax: [phone]     Social Determinants of Health (SDOH) Interventions    Readmission Risk Interventions Readmission Risk Prevention Plan 01/14/2021  PCP or Specialist Appt within 3-5 Days Complete  HRI or [place] Complete  Social Work Consult for [place] Planning/Counseling Complete  Palliative Care Screening Not Applicable  Medication Review Press photographer) Complete  Some recent data might be hidden

## 2021-01-15 LAB — CBC WITH DIFFERENTIAL/PLATELET
Abs Immature Granulocytes: 0.05 10*3/uL (ref 0.00–0.07)
Basophils Absolute: 0 10*3/uL (ref 0.0–0.1)
Basophils Relative: 0 %
Eosinophils Absolute: 0 10*3/uL (ref 0.0–0.5)
Eosinophils Relative: 0 %
HCT: 25.9 % — ABNORMAL LOW (ref 36.0–46.0)
Hemoglobin: 8.3 g/dL — ABNORMAL LOW (ref 12.0–15.0)
Immature Granulocytes: 1 %
Lymphocytes Relative: 7 %
Lymphs Abs: 0.7 10*3/uL (ref 0.7–4.0)
MCH: 29 pg (ref 26.0–34.0)
MCHC: 32 g/dL (ref 30.0–36.0)
MCV: 90.6 fL (ref 80.0–100.0)
Monocytes Absolute: 0.7 10*3/uL (ref 0.1–1.0)
Monocytes Relative: 7 %
Neutro Abs: 7.9 10*3/uL — ABNORMAL HIGH (ref 1.7–7.7)
Neutrophils Relative %: 85 %
Platelets: 264 10*3/uL (ref 150–400)
RBC: 2.86 MIL/uL — ABNORMAL LOW (ref 3.87–5.11)
RDW: 14.9 % (ref 11.5–15.5)
WBC: 9.3 10*3/uL (ref 4.0–10.5)
nRBC: 0 % (ref 0.0–0.2)

## 2021-01-15 LAB — BASIC METABOLIC PANEL
Anion gap: 9 (ref 5–15)
BUN: 14 mg/dL (ref 8–23)
CO2: 25 mmol/L (ref 22–32)
Calcium: 8.3 mg/dL — ABNORMAL LOW (ref 8.9–10.3)
Chloride: 104 mmol/L (ref 98–111)
Creatinine, Ser: 0.56 mg/dL (ref 0.44–1.00)
GFR, Estimated: >60 mL/min (ref >60)
Glucose, Bld: 144 mg/dL — ABNORMAL HIGH (ref 70–99)
Potassium: 4.6 mmol/L (ref 3.5–5.1)
Sodium: 138 mmol/L (ref 135–145)

## 2021-01-15 MED ORDER — ASPIRIN EC 81 MG PO TBEC
81.0000 mg | DELAYED_RELEASE_TABLET | Freq: Every day | ORAL | Status: DC
  Administered 2021-01-15 – 2021-01-20 (×6): 81 mg via ORAL
  Filled 2021-01-15 (×6): qty 1

## 2021-01-15 MED ORDER — APIXABAN 5 MG PO TABS
5.0000 mg | ORAL_TABLET | Freq: Two times a day (BID) | ORAL | Status: DC
  Administered 2021-01-15 – 2021-01-20 (×11): 5 mg via ORAL
  Filled 2021-01-15 (×11): qty 1

## 2021-01-15 MED ORDER — ACETAMINOPHEN 325 MG PO TABS
650.0000 mg | ORAL_TABLET | Freq: Four times a day (QID) | ORAL | Status: DC | PRN
  Administered 2021-01-15 – 2021-01-20 (×2): 650 mg via ORAL
  Filled 2021-01-15 (×2): qty 2

## 2021-01-15 MED ORDER — SODIUM CHLORIDE 0.9 % IV SOLN
INTRAVENOUS | Status: DC | PRN
  Administered 2021-01-15 – 2021-01-18 (×2): 250 mL via INTRAVENOUS

## 2021-01-15 NOTE — Progress Notes (Signed)
Subjective: 1 Day Post-Op Procedure(s) (LRB): INTRAMEDULLARY (IM) NAIL INTERTROCHANTRIC (Left)   Alert and doing well.  Appears comfortable.  Hemoglobin nine 8.3.  Dressings dry. Has been out of bed.  Patient reports pain as mild.  Objective:   VITALS:   Vitals:   01/15/21 1159 01/15/21 1552  BP: 109/72 121/74  Pulse: (!) 110 92  Resp: 20 17  Temp: 97.6 F (36.4 C) (!) 97.5 F (36.4 C)  SpO2: 96% 91%    Neurologically intact Sensation intact distally Dorsiflexion/Plantar flexion intact Incision: dressing C/D/I  LABS Recent Labs    01/13/21 1208 01/14/21 0729 01/15/21 0727  HGB 9.9* 8.8* 8.3*  HCT 31.7* 28.6* 25.9*  WBC 14.0* 11.8* 9.3  PLT 264 324 264    Recent Labs    01/13/21 1208 01/14/21 0729 01/15/21 0727  NA 136 136 138  K 3.9 3.7 4.6  BUN 10 11 14   CREATININE 0.60 0.48 0.56  GLUCOSE 102* 103* 144*    Recent Labs    01/13/21 0816 01/14/21 0729  INR 1.1 1.2     Assessment/Plan: 1 Day Post-Op Procedure(s) (LRB): INTRAMEDULLARY (IM) NAIL INTERTROCHANTRIC (Left)   Advance diet Up with therapy Discharge to SNF

## 2021-01-15 NOTE — Evaluation (Addendum)
Physical Therapy Evaluation Patient Details Name: [name] MRN: [id number] DOB: [birth year] Today's Date: 01/15/2021   History of Present Illness  Pt is a 75 y/o F who presents to ED from home for evaluation of L hip pain after a fall with minor head trauma but no LOC. Pt found to have L femur fx & underwent L IM nail placement by Dr. Harlow Mares on 01/14/21.  Clinical Impression  Pt seen for PT evaluation with co-tx with OT. Pt without post op shoe so standing/transfers deferred at this time (pt also declining attempting to stand & also declined attempt to get to recliner with slide board). Pt limited by anxiety & c/o pain during session with PT/OT providing distraction & education for breathing techniques; pt noted to have elevated HR throughout. PT educated pt on weight bearing restrictions & recommendation for STR upon d/c but pt very weary of this despite not having accessible home nor 24 hr assistance upon d/c. Pt requires min assist for supine>sit but is able to perform sit>supine with supervision. Pt would benefit from STR upon d/c to maximize independence with functional mobility & reduce fall risk prior to return home. Will continue to follow pt acutely to progress transfers, gait, & strengthening.     Follow Up Recommendations SNF;Supervision/Assistance - 24 hour    Equipment Recommendations   (TBD in next venue)    Recommendations for Other Services       Precautions / Restrictions Precautions Precautions: Fall Restrictions Weight Bearing Restrictions: Yes RLE Weight Bearing: Partial weight bearing (in post op shoe) LLE Weight Bearing: Weight bearing as tolerated      Mobility  Bed Mobility Overal bed mobility: Needs Assistance Bed Mobility: Supine to Sit;Sit to Supine     Supine to sit: Min assist;HOB elevated Sit to supine: Supervision   General bed mobility comments: extra time to scoot to EOB & assistance to move LLE to EOB    Transfers Overall transfer level:   (deferred sit>stand 2/2 no post op shoe in room)                  Ambulation/Gait                Stairs            Wheelchair Mobility    Modified Rankin (Stroke Patients Only)       Balance Overall balance assessment: Needs assistance Sitting-balance support: Bilateral upper extremity supported;Feet supported Sitting balance-Leahy Scale: Fair Sitting balance - Comments: supervision sitting EOB                                     Pertinent Vitals/Pain Pain Assessment: Faces Faces Pain Scale: Hurts even more Pain Location: "everywhere" Pain Descriptors / Indicators: Aching Pain Intervention(s): Monitored during session;Patient requesting pain meds-RN notified;Utilized relaxation techniques    Home Living Family/patient expects to be discharged to:: Private residence Living Arrangements: Alone Available Help at Discharge: Family;Personal care attendant;Available PRN/intermittently Type of Home: House Home Access: Stairs to enter Entrance Stairs-Rails: None Entrance Stairs-Number of Steps: 1-2 Home Layout: One level Home Equipment: Walker - 2 wheels      Prior Function Level of Independence:  (prior to initial surgery pt was ambulatory with RW, requiring more assistance following last admission)      ADL's / Homemaking Assistance Needed: Requires assistance for cooking, cleaning        Hand Dominance  Dominant Hand: Right    Extremity/Trunk Assessment   Upper Extremity Assessment Upper Extremity Assessment: Generalized weakness    Lower Extremity Assessment Lower Extremity Assessment: Generalized weakness (3/5 R knee extension & hip flexion in sitting, LLE knee extension & hip flexion 2/5 in sitting)       Communication   Communication: HOH  Cognition Arousal/Alertness: Awake/alert Behavior During Therapy: Anxious Overall Cognitive Status: No family/caregiver present to determine baseline cognitive functioning                                  General Comments: Extremely HOH, anxious throughout session with PT providing distraction as able.      General Comments General comments (skin integrity, edema, etc.): elevated HR at rest, 113 bpm, up to 141 bpm with movement (decreases to 133 bpm with rest) - nurse made aware. SpO2 >90% on 2L/min via nasal cannula    Exercises     Assessment/Plan    PT Assessment Patient needs continued PT services  PT Problem List Decreased strength;Decreased range of motion;Decreased activity tolerance;Decreased balance;Decreased mobility;Decreased safety awareness;Decreased knowledge of use of DME;Decreased knowledge of precautions;Cardiopulmonary status limiting activity;Pain;Decreased skin integrity       PT Treatment Interventions DME instruction;Balance training;Gait training;Stair training;Functional mobility training;Therapeutic activities;Therapeutic exercise;Patient/family education;Modalities;Neuromuscular re-education;Wheelchair mobility training;Manual techniques    PT Goals (Current goals can be found in the Care Plan section)  Acute Rehab PT Goals Patient Stated Goal: To go home PT Goal Formulation: With patient Time For Goal Achievement: 01/28/21 Potential to Achieve Goals: Fair    Frequency BID   Barriers to discharge Decreased caregiver support;Inaccessible home environment stairs to enter, lives alone    Co-evaluation               AM-PAC PT "6 Clicks" Mobility  Outcome Measure Help needed turning from your back to your side while in a flat bed without using bedrails?: A Little Help needed moving from lying on your back to sitting on the side of a flat bed without using bedrails?: A Little Help needed moving to and from a bed to a chair (including a wheelchair)?: A Lot Help needed standing up from a chair using your arms (e.g., wheelchair or bedside chair)?: A Lot Help needed to walk in hospital room?: Total Help needed  climbing 3-5 steps with a railing? : Total 6 Click Score: 12    End of Session Equipment Utilized During Treatment: Oxygen Activity Tolerance: Patient limited by pain;Patient limited by fatigue (limited by anxiety) Patient left: in bed;with bed alarm set;with call bell/phone within reach (in care of OT) Nurse Communication: Weight bearing status (elevated HR) PT Visit Diagnosis: Difficulty in walking, not elsewhere classified (R26.2);Muscle weakness (generalized) (M62.81);Pain Pain - part of body:  ("everywhere")    Time: 3614-4315 PT Time Calculation (min) (ACUTE ONLY): 35 min   Charges:   PT Evaluation $PT Eval Low Complexity: 1 Low PT Treatments $Therapeutic Activity: 8-22 mins        Lavone Nian, PT, DPT 01/15/21, 1:31 PM   [name] 01/15/2021, 1:26 PM

## 2021-01-15 NOTE — Progress Notes (Signed)
Physical Therapy Treatment Patient Details Name: Rodnisha Blomgren MRN: [id number] DOB: September 06, 1946 Today's Date: 01/15/2021    History of Present Illness Pt is a 75 y/o F who presents to ED from home for evaluation of L hip pain after a fall with minor head trauma but no LOC. Pt found to have L femur fx & underwent L IM nail placement by Dr. Harlow Mares on 01/14/21.    PT Comments    Pt less anxious upon PT arrival & agreeable to trying to stand. Pt with surgical shoe PT dons total assist. Pt is able to complete supine>sit with min assist & sit>stand with min assist from elevated EOB. Pt is able to tolerate standing ~30 seconds + 60 seconds with seated rest break in between. Pt progresses to taking 2 side steps to L but decreased ability to maintain PWB RLE. Pt performs LAQ 2x10 for strengthening. Pt with c/o anxiety & requesting xanax at end of session - nurse made aware.     Follow Up Recommendations  SNF;Supervision/Assistance - 24 hour     Equipment Recommendations  None recommended by PT    Recommendations for Other Services       Precautions / Restrictions Precautions Precautions: Fall Restrictions Weight Bearing Restrictions: Yes RLE Weight Bearing: Partial weight bearing LLE Weight Bearing: Weight bearing as tolerated    Mobility  Bed Mobility Overal bed mobility: Needs Assistance Bed Mobility: Supine to Sit;Sit to Supine     Supine to sit: Min assist;HOB elevated Sit to supine: Supervision;HOB elevated   General bed mobility comments: assistance to move LLE To EOB with supine>sit    Transfers Overall transfer level: Needs assistance Equipment used: Rolling walker (2 wheeled) Transfers: Sit to/from Stand Sit to Stand: Min assist         General transfer comment: decreased awareness of safe hand placement  Ambulation/Gait   Gait Distance (Feet):  (1) Assistive device: Rolling walker (2 wheeled)   Gait velocity: decreased   General Gait Details: 2 side steps to  L at EOB with RW & min assist with decreased foot clearance BLE & decreased ability to maintain PWB RLE   Stairs             Wheelchair Mobility    Modified Rankin (Stroke Patients Only)       Balance Overall balance assessment: Needs assistance Sitting-balance support: Bilateral upper extremity supported;Feet supported Sitting balance-Leahy Scale: Fair Sitting balance - Comments: supervision sitting EOB   Standing balance support: During functional activity;Bilateral upper extremity supported Standing balance-Leahy Scale: Poor Standing balance comment: UE support on RW during standing                            Cognition Arousal/Alertness: Awake/alert Behavior During Therapy: Anxious Overall Cognitive Status: No family/caregiver present to determine baseline cognitive functioning                                 General Comments: Extremely HOH, anxious throughout session with PT providing distraction as able.      Exercises      General Comments General comments (skin integrity, edema, etc.): HR 113 bpm - 133 bpm during session, pt on 2L/min via nasal cannula & SpO2 lowest 86% but increases to 90% or >, cuing for pursed lip breathing when pt c/o anxiety      Pertinent Vitals/Pain Pain Assessment: Faces Faces Pain  Scale: Hurts a little bit Pain Location: LLE Pain Descriptors / Indicators: Guarding Pain Intervention(s): Monitored during session;Limited activity within patient's tolerance    Home Living Family/patient expects to be discharged to:: Private residence Living Arrangements: Alone Available Help at Discharge: Family;Personal care attendant;Available PRN/intermittently Type of Home: House Home Access: Stairs to enter Entrance Stairs-Rails: None Home Layout: One level Home Equipment: Environmental consultant - 2 wheels      Prior Function Level of Independence:  (prior to initial surgery pt was ambulatory with RW, requiring more assistance  following last admission)    ADL's / Homemaking Assistance Needed: Requires assistance for cooking, cleaning     PT Goals (current goals can now be found in the care plan section) Acute Rehab PT Goals Patient Stated Goal: To go home PT Goal Formulation: With patient Time For Goal Achievement: 01/28/21 Potential to Achieve Goals: Fair Progress towards PT goals: Progressing toward goals    Frequency    BID      PT Plan Current plan remains appropriate    Co-evaluation              AM-PAC PT "6 Clicks" Mobility   Outcome Measure  Help needed turning from your back to your side while in a flat bed without using bedrails?: A Little Help needed moving from lying on your back to sitting on the side of a flat bed without using bedrails?: A Little Help needed moving to and from a bed to a chair (including a wheelchair)?: A Lot Help needed standing up from a chair using your arms (e.g., wheelchair or bedside chair)?: A Little Help needed to walk in hospital room?: A Lot Help needed climbing 3-5 steps with a railing? : Total 6 Click Score: 14    End of Session Equipment Utilized During Treatment: Oxygen;Gait belt Activity Tolerance: Patient tolerated treatment well;Patient limited by fatigue Patient left: in bed;with bed alarm set;with call bell/phone within reach Nurse Communication:  (need for new SCDs) PT Visit Diagnosis: Difficulty in walking, not elsewhere classified (R26.2);Muscle weakness (generalized) (M62.81);Pain Pain - part of body:  ("everywhere")     Time: 6979-4801 PT Time Calculation (min) (ACUTE ONLY): 12 min  Charges:  $Therapeutic Activity: 8-22 mins                     Lavone Nian, PT, DPT 01/15/21, 4:17 PM    [name] 01/15/2021, 4:16 PM

## 2021-01-15 NOTE — Progress Notes (Addendum)
ORTHOPAEDIC progress note  REQUESTING PHYSICIAN: Sidney Ace, MD  Chief Complaint: Right second toe gangrene  HPI: [name] is a 75 y.o. female who presents today resting in bed comfortably but still having some pain in her left hip due to recent hip fracture ORIF.  Patient has kept dressings clean, dry, and intact to the right foot.  Past Medical History:  Diagnosis Date  . Anxiety   . CAD (coronary artery disease)   . Carpal tunnel syndrome   . Cataract   . Complication of anesthesia    has woken up at times  . COPD (chronic obstructive pulmonary disease) ([place])   . CRP elevated 09/14/2015  . Depression   . Difficult intubation   . Dyslipidemia   . Dyspnea    DOE  . Dysrhythmia    hx palpatations  . Elevated sedimentation rate 09/14/2015  . Esophageal spasm   . Gastrointestinal parasites   . GERD (gastroesophageal reflux disease)   . Hiatal hernia   . History of peptic ulcer disease   . Hyperthyroidism   . Hypothyroidism   . Low magnesium levels 09/14/2015  . Pelvic fracture ([place]) 2008   fall from riding a horse  . Reflux   . Rotator cuff injury   . Stenosis, spinal, lumbar    Past Surgical History:  Procedure Laterality Date  . AMPUTATION TOE Right 12/26/2020   Procedure: AMPUTATION TOE-3rd Toes;  Surgeon: Samara Deist, DPM;  Location: [facility];  Service: Podiatry;  Laterality: Right;  . APPENDECTOMY    . BACK SURGERY     CERVICAL FUSION  . CARPAL TUNNEL RELEASE    . CATARACT EXTRACTION W/PHACO Right 08/07/2017   Procedure: CATARACT EXTRACTION PHACO AND INTRAOCULAR LENS PLACEMENT (IOC);  Surgeon: Birder Robson, MD;  Location: [facility];  Service: Ophthalmology;  Laterality: Right;  [place] 00:52.0 AP% 16.8 CDE 8.74 Fluid Pack Lot # [id number]  . CATARACT EXTRACTION W/PHACO Left 09/04/2017   Procedure: CATARACT EXTRACTION PHACO AND INTRAOCULAR LENS PLACEMENT (IOC);  Surgeon: Birder Robson, MD;  Location: [facility];  Service: Ophthalmology;   Laterality: Left;  [place] 00:34 AP% 17.0 CDE 5.80 Fluid pack lot # [id number]  . CESAREAN SECTION    . CHOLECYSTECTOMY    . FRACTURE SURGERY    . HIP SURGERY    . INTRAMEDULLARY (IM) NAIL INTERTROCHANTERIC Right 10/01/2016   Procedure: INTRAMEDULLARY (IM) NAIL INTERTROCHANTRIC;  Surgeon: Corky Mull, MD;  Location: [facility];  Service: Orthopedics;  Laterality: Right;  . KYPHOPLASTY N/A 08/20/2018   Procedure: [id number];  Surgeon: Hessie Knows, MD;  Location: [facility];  Service: Orthopedics;  Laterality: N/A;  . LOWER EXTREMITY ANGIOGRAPHY Right 12/18/2019   Procedure: LOWER EXTREMITY ANGIOGRAPHY;  Surgeon: Algernon Huxley, MD;  Location: [place] [facility];  Service: Cardiovascular;  Laterality: Right;  . LOWER EXTREMITY ANGIOGRAPHY Right 12/02/2020   Procedure: LOWER EXTREMITY ANGIOGRAPHY;  Surgeon: Algernon Huxley, MD;  Location: [place] [facility];  Service: Cardiovascular;  Laterality: Right;  . LOWER EXTREMITY ANGIOGRAPHY Right 12/23/2020   Procedure: Lower Extremity Angiography;  Surgeon: Algernon Huxley, MD;  Location: [place] [facility];  Service: Cardiovascular;  Laterality: Right;  . LOWER EXTREMITY ANGIOGRAPHY Right 12/24/2020   Procedure: Lower Extremity Angiography;  Surgeon: Algernon Huxley, MD;  Location: [place] [facility];  Service: Cardiovascular;  Laterality: Right;  . NECK SURGERY    . NOSE SURGERY    . ROTATOR CUFF REPAIR     x2  . SHOULDER  ARTHROSCOPY  12/07/2011   Procedure: ARTHROSCOPY SHOULDER;  Surgeon: Ninetta Lights, MD;  Location: [place];  Service: Orthopedics;  Laterality: Right;  Debridement Partial Cuff Tear, Release Coracoacromial Ligament  . SHOULDER SURGERY  12/07/2011   right   Social History   Socioeconomic History  . Marital status: Divorced    Spouse name: Not on file  . Number of children: Not on file  . Years of education: Not on file  . Highest education level: Not on file  Occupational History  . Occupation: retired Film/video editor  Tobacco Use  . Smoking status: Current Some Day Smoker    Packs/day: 0.00    Types: Cigarettes  . Smokeless tobacco: Never Used  . Tobacco comment: pt states 2 cigs per month   Substance and Sexual Activity  . Alcohol use: No  . Drug use: No  . Sexual activity: Never  Other Topics Concern  . Not on file  Social History Narrative  . Not on file   Social Determinants of Health   Financial Resource Strain: Not on file  Food Insecurity: Not on file  Transportation Needs: Not on file  Physical Activity: Not on file  Stress: Not on file  Social Connections: Not on file   Family History  Problem Relation Age of Onset  . Aneurysm Other    No Known Allergies Prior to Admission medications   Medication Sig Start Date End Date Taking? Authorizing Provider  ALPRAZolam Duanne Moron) 0.5 MG tablet Take 0.5 mg by mouth every 6 (six) hours as needed for anxiety.   Yes [provider]  amLODipine (NORVASC) 5 MG tablet Take 1 tablet (5 mg total) by mouth daily. 08/22/18  Yes Gladstone Lighter, MD  apixaban (ELIQUIS) 5 MG TABS tablet Take 1 tablet (5 mg total) by mouth 2 (two) times daily. 12/31/20 03/01/21 Yes Sreenath, Sudheer B, MD  aspirin EC 81 MG tablet Take 1 tablet (81 mg total) by mouth daily. 12/02/20  Yes Dew, Erskine Squibb, MD  atorvastatin (LIPITOR) 10 MG tablet Take 1 tablet (10 mg total) by mouth daily. 12/18/19 12/17/20 Yes Algernon Huxley, MD  buPROPion [facility] SR) 150 MG 12 hr tablet Take 150 mg by mouth daily.    Yes [provider]  escitalopram (LEXAPRO) 10 MG tablet Take 10 mg by mouth daily.  10/15/13  Yes [provider]  Fluticasone-Salmeterol (ADVAIR DISKUS) 250-50 MCG/DOSE AEPB Inhale 1 puff into the lungs 2 (two) times daily. 12/31/20 01/30/21 Yes Sreenath, Sudheer B, MD  levothyroxine (SYNTHROID, LEVOTHROID) 88 MCG tablet Take 88 mcg by mouth daily before breakfast.    Yes [provider]  montelukast (SINGULAIR) 10 MG tablet Take 10 mg  by mouth daily.   Yes [provider]  nicotine (NICODERM CQ - DOSED IN MG/24 HOURS) 14 mg/24hr patch Place 1 patch (14 mg total) onto the skin daily. 12/31/20 03/01/21 Yes Sreenath, Sudheer B, MD  oxyCODONE (OXY IR/ROXICODONE) 5 MG immediate release tablet Take 1-2 tablets (5-10 mg total) by mouth every 6 (six) hours as needed for up to 10 days for severe pain. 12/31/20 01/10/21 Yes Sreenath, Sudheer B, MD  tizanidine (ZANAFLEX) 2 MG capsule Take 1 capsule (2 mg total) by mouth 3 (three) times daily as needed for muscle spasms. 11/03/20 02/01/21 Yes Milinda Pointer, MD  traZODone (DESYREL) 100 MG tablet Take 1 tablet (100 mg total) by mouth at bedtime. Patient taking differently: Take 200 mg by mouth at bedtime. 08/21/18  Yes Kalisetti,  Hart Rochester, MD   CT ANGIO CHEST PE W OR WO CONTRAST  Result Date: 01/13/2021 CLINICAL DATA:  Recent right lower extremity angiogram and thrombo lytic therapy. Recent hip fracture. PE suspected. EXAM: CT ANGIOGRAPHY CHEST WITH CONTRAST TECHNIQUE: Multidetector CT imaging of the chest was performed using the standard protocol during bolus administration of intravenous contrast. Multiplanar CT image reconstructions and MIPs were obtained to evaluate the vascular anatomy. CONTRAST:  27mL OMNIPAQUE IOHEXOL 350 MG/ML SOLN COMPARISON:  Chest radiograph 01/11/2021.  CTA chest 10/21/2018. FINDINGS: Cardiovascular: The quality of this exam for evaluation of pulmonary embolism is good. Minimal motion degradation. The bolus is well timed. No evidence of pulmonary embolism. Aortic atherosclerosis. Normal heart size, without pericardial effusion. Lad coronary artery calcification. Mediastinum/Nodes: No supraclavicular adenopathy. Precarinal node of 1.4 cm is similar to on the prior exam. Subcarinal node measures 1.5 cm on 45/4, similar (when remeasured). Right hilar adenopathy at 1.7 cm on 44/4, 1.6 cm previously. Small hiatal hernia. Distal esophageal wall thickening is moderate,  similar to slightly increased. Lungs/Pleura: Trace bilateral pleural thickening. Moderate centrilobular emphysema. Lower lobe predominant bronchial wall thickening. Fluid or secretions in the right lower lobe endobronchial tree on 64/6. Minimal left base dependent atelectasis. Right base airspace disease. Upper Abdomen: Cholecystectomy. Scattered subcentimeter hyperenhancing foci within the liver are nonspecific, not readily apparent on the prior. Example in the right hepatic lobe on 83/4 and 92/4. Intrahepatic biliary duct dilatation is moderate and not significantly changed. Common duct measures 10 mm in the pancreatic head versus 8 mm at the same level on the prior. Pancreatic duct is borderline dilated, felt to be similar. Normal imaged portions of the spleen, stomach, right kidney. Upper pole left renal 3.1 cm cyst. Left adrenal thickening and nodularity is similar likely relates to hyperplasia. The right adrenal gland is minimally thickened. Musculoskeletal: Lower cervical spine fixation. Remote right rib fractures. Loss of vertebral body height at multiple thoracic levels without ventral canal encroachment. Felt to be similar. Review of the MIP images confirms the above findings. IMPRESSION: 1. No evidence of pulmonary embolism. 2. Right lower lobe airspace disease, suspicious for aspiration, especially given fluid/secretions in the right lower lobe endobronchial tree. 3. Similar to minimal increase in thoracic adenopathy, favored to be reactive. 4. Small hiatal hernia. Distal esophageal wall thickening suggests esophagitis. 5. Similar intrahepatic and similar to minimally increased common duct dilatation. Correlate with bilirubin levels. If elevated, consider MRCP. 6. Scattered subcentimeter hyperenhancing foci within the liver are not readily apparent on the prior. Most likely perfusion anomalies. If any history of primary malignancy or liver disease, consider outpatient pre and post contrast abdominal MRI.  7. Aortic Atherosclerosis (ICD10-I70.0) and Emphysema (ICD10-J43.9). Coronary artery atherosclerosis. Electronically Signed   By: Abigail Miyamoto M.D.   On: 01/13/2021 18:58   DG C-Arm 1-60 Min  Result Date: 01/14/2021 CLINICAL DATA:  Femoral neck fracture. EXAM: DG C-ARM 1-60 MIN; LEFT FEMUR 2 VIEWS FLUOROSCOPY TIME:  Fluoroscopy Time:  20 seconds COMPARISON:  January 11, 2021 FINDINGS: For C-arm fluoroscopic images were obtained intraoperatively and submitted for post operative interpretation. These images demonstrate intramedullary nail fixation of a femoral neck fracture. Improved, near anatomic alignment. Please see the performing provider's procedural report for further detail. IMPRESSION: Intraoperative fluoroscopy, as detailed above. Electronically Signed   By: Margaretha Sheffield MD   On: 01/14/2021 16:33   DG FEMUR MIN 2 VIEWS LEFT  Result Date: 01/14/2021 CLINICAL DATA:  Femoral neck fracture. EXAM: DG C-ARM 1-60 MIN; LEFT FEMUR 2 VIEWS  FLUOROSCOPY TIME:  Fluoroscopy Time:  20 seconds COMPARISON:  January 11, 2021 FINDINGS: For C-arm fluoroscopic images were obtained intraoperatively and submitted for post operative interpretation. These images demonstrate intramedullary nail fixation of a femoral neck fracture. Improved, near anatomic alignment. Please see the performing provider's procedural report for further detail. IMPRESSION: Intraoperative fluoroscopy, as detailed above. Electronically Signed   By: Margaretha Sheffield MD   On: 01/14/2021 16:33    Positive ROS: All other systems have been reviewed and were otherwise negative with the exception of those mentioned in the HPI and as above.  12 point ROS was performed.  Physical Exam: General: Alert and oriented.  No apparent distress.  Vascular:  Left foot:Dorsalis Pedis:  absent Posterior Tibial:  absent  Right foot: Dorsalis Pedis:  absent Posterior Tibial:  absent  Neuro:intact gross sensation  Derm: Left foot without  wound  Right foot with healing third toe amputation site.  There is no dehiscence.  Sutures are intact.  The distal aspect of the second toe has gangrenous changes.  This is dry at this time.  Ortho/MS: Status post right third toe amputation.  Assessment: Gangrene right second toe.  Status post right third toe amputation.  Plan: -Patient seen and examined. -Dressing change today to the right forefoot.  Incision line to the right third amputation site appears to be stable with sutures intact.  Still appears to have dry gangrene to the right second toe.  No other open ulcerations noted.  No acute signs of infection present. -Discussed with patient need for surgical intervention to the right second toe at some point consisting of right partial toe amputation. -Discussed case with Dr. Vickki Muff.  Dr. Vickki Muff would like patient to follow-up in clinic within 1 to 2 weeks of discharge for reevaluation.  Patient will likely need surgical intervention at some point but can be done on outpatient basis.  Appears to be stable at this time. -Continue with same dressing changes.  Podiatry team to follow peripherally at this time with patient.  Discharge instructions placed in chart.  Caroline More, DPM  01/15/2021 1:22 PM

## 2021-01-15 NOTE — Progress Notes (Signed)
PROGRESS NOTE    [name]  GQB:[id number] DOB: 1946/09/10 DOA: 01/11/2021 PCP: Albina Billet, MD  Brief Narrative:  75 y.o. female with medical history significant for PAD s/p mechanical thrombectomy and stent angioplasty of the right lower extremity and third right toe amputation, COPD, chronic respiratory failure with hypoxia on 2 L supplemental O2 via [place] at all times, CAD, hyperlipidemia, depression/anxiety, hard of hearing who presents to the ED for evaluation of left hip pain after a fall.  Patient was recently hospitalized from 12/22/2020-12/31/2020 for gangrene of the third toe on the right foot.  She underwent angiogram of RLE on 12/23/2020 with mechanical thrombectomy and catheter directed thrombolytic therapy to the right SFA, popliteal artery, and anterior tibial artery.  S/p amputation of the third toe on the right foot 12/26/2020.  She has been on aspirin and Eliquis.  She was discharged to home with home health after SNF refusal.  Had a mechanical fall at home attributed to slick surgical bandages.  Minor head trauma but did not lose consciousness.  No other significant injuries.  Imaging on admission demonstrated a mildly impacted fracture of the left femoral neck  Orthopedics on consult.  Plan for operative fixation Friday, 01/14/2021.  Patient had acute onset of sinus tachycardia on 2/24.  Given concern for PE in the setting of held anticoagulation pursued CTA thorax.  No PE identified however there was evidence of a right basilar pneumonia, suspicious for aspiration.  2/26: Postop day 1 status post left intramedullary nail for intertrochanteric left femur fracture.  Tolerated procedure well without complications.  Is endorsing postoperative pain.  Assessment & Plan:   Principal Problem:   Fracture of femoral neck, left, closed (HCC) Active Problems:   HLD (hyperlipidemia)   Hypothyroidism   Anxiety and depression   Hypomagnesemia   Chronic obstructive pulmonary disease (COPD)  (HCC)   PAD (peripheral artery disease) (HCC)   Chronic respiratory failure with hypoxia (HCC)   Hypokalemia  Right base pneumonia, likely aspiration Patient had acute onset of tachycardia on 2/24 CT thorax revealed above Patient hemodynamically stable, afebrile Started on Unasyn Tachycardia improved Plan: Continue Unasyn Plan for 5-day antibiotic course  Sinus tachycardia Patient has episodes of elevated heart rate of unclear etiology No history of atrial fibrillation Episodes resolved with administration of IV metoprolol Patient remains relatively asymptomatic Plan: Telemetry Check echo  Left femoral neck fracture Status post chemical fall Patient unsure but likely last dose of Eliquis was 2/21 Orthopedics on consult Status post IM nail, 2/25 Plan: Okay for diet today Multimodal pain control Avoid IV narcotics if able Resume Eliquis and aspirin PT and OT Anticipate need for skilled nursing facility, [facility] consult  COPD/chronic respiratory failure with hypoxia: Chronic and stable on home 2 L of home O2 via [place].   Continue Advair, Singulair, albuterol as needed.  Hypokalemia/hypomagnesemia: Supplement with IV magnesium followed by oral potassium.  PAD: S/p recent admission with angiogram of RLE on 12/23/2020 with mechanical thrombectomy and catheter directed thrombolytic therapy to the right SFA, popliteal artery, and anterior tibial artery.  S/p amputation of the third toe on the right foot 12/26/2020. -Resumed Eliquis and aspirin -Continue atorvastatin  Hypertension: Continue amlodipine.  Hypothyroidism: Continue Synthroid.  Hyperlipidemia: Atorvastatin.  Anxiety/depression: Continue Wellbutrin and Lexapro.    DVT prophylaxis: Eliquis Code Status: Full Family Communication: Raquel Sarna, daughter-in-law at [phone] on 2/26 Disposition Plan: Status is: Inpatient  Remains inpatient appropriate because:Inpatient level of care appropriate due to severity  of illness  Dispo: The patient is from: Home              Anticipated d/c is to: SNF              Patient currently is not medically stable to d/c.   Difficult to place patient No   Postop day 1 status post IM nail for intramedullary femur fracture.  Postoperative pain control.  Therapy evaluations pending.  Anticipate need for skilled nursing facility.           Level of care: Med-Surg  Consultants:   Orthopedics  Procedures:   None  Antimicrobials:   None   Subjective: Patient seen and examined.  Continues to endorse pain in the leg.  No other issues or complaints.  Objective: Vitals:   01/14/21 2122 01/15/21 0129 01/15/21 0526 01/15/21 0823  BP: (!) 98/55 99/60 (!) 108/53 110/66  Pulse: (!) 113 (!) 101 81 (!) 109  Resp: 20 20 20 20   Temp: 97.8 F (36.6 C) 97.6 F (36.4 C) (!) 97.5 F (36.4 C) 97.7 F (36.5 C)  TempSrc: Oral Oral Oral Oral  SpO2: 97% 96% 100% 94%  Weight:      Height:        Intake/Output Summary (Last 24 hours) at 01/15/2021 0953 Last data filed at 01/15/2021 0528 Gross per 24 hour  Intake 883.05 ml  Output 450 ml  Net 433.05 ml   Filed Weights   01/11/21 1842  Weight: 54.4 kg    Examination:  General exam: Appears calm and comfortable  Respiratory system: Clear to auscultation. Respiratory effort normal. Cardiovascular system: S1 & S2 heard, RRR. No JVD, murmurs, rubs, gallops or clicks. No pedal edema. Gastrointestinal system: Abdomen is nondistended, soft and nontender. No organomegaly or masses felt. Normal bowel sounds heard. Central nervous system: Alert and oriented. No focal neurological deficits. Extremities: Decreased power LLE, left leg shortened and externally rotated Skin: R distal second toe with dry necrotic appearance.  S/p right third toe amputation Psychiatry: Judgement and insight appear normal. Mood & affect appropriate.     Data Reviewed: I have personally reviewed following labs and imaging  studies  CBC: Recent Labs  Lab 01/11/21 1842 01/12/21 0453 01/13/21 1208 01/14/21 0729 01/15/21 0727  WBC 9.5 9.1 14.0* 11.8* 9.3  NEUTROABS 7.3  --   --  9.8* 7.9*  HGB 11.4* 9.6* 9.9* 8.8* 8.3*  HCT 34.9* 29.9* 31.7* 28.6* 25.9*  MCV 88.6 89.3 91.4 91.7 90.6  PLT 389 287 264 324 517   Basic Metabolic Panel: Recent Labs  Lab 01/11/21 1842 01/12/21 0453 01/13/21 1208 01/14/21 0729 01/15/21 0727  NA 136 137 136 136 138  K 3.2* 2.8* 3.9 3.7 4.6  CL 100 101 100 100 104  CO2 25 30 25 27 25   GLUCOSE 114* 134* 102* 103* 144*  BUN 13 14 10 11 14   CREATININE 0.62 0.66 0.60 0.48 0.56  CALCIUM 8.6* 8.2* 8.5* 8.4* 8.3*  MG 1.6* 2.3  --   --   --    GFR: Estimated Creatinine Clearance: 53.8 mL/min (by C-G formula based on SCr of 0.56 mg/dL). Liver Function Tests: No results for input(s): AST, ALT, ALKPHOS, BILITOT, PROT, ALBUMIN in the last 168 hours. No results for input(s): LIPASE, AMYLASE in the last 168 hours. No results for input(s): AMMONIA in the last 168 hours. Coagulation Profile: Recent Labs  Lab 01/11/21 1958 01/13/21 0816 01/14/21 0729  INR 1.2 1.1 1.2   Cardiac Enzymes: No results for input(s):  CKTOTAL, CKMB, CKMBINDEX, TROPONINI in the last 168 hours. BNP (last 3 results) No results for input(s): PROBNP in the last 8760 hours. HbA1C: No results for input(s): HGBA1C in the last 72 hours. CBG: No results for input(s): GLUCAP in the last 168 hours. Lipid Profile: No results for input(s): CHOL, HDL, LDLCALC, TRIG, CHOLHDL, LDLDIRECT in the last 72 hours. Thyroid Function Tests: No results for input(s): TSH, T4TOTAL, FREET4, T3FREE, THYROIDAB in the last 72 hours. Anemia Panel: No results for input(s): VITAMINB12, FOLATE, FERRITIN, TIBC, IRON, RETICCTPCT in the last 72 hours. Sepsis Labs: Recent Labs  Lab 01/13/21 1208  PROCALCITON 0.36    Recent Results (from the past 240 hour(s))  Resp Panel by RT-PCR (Flu A&B, Covid) Nasopharyngeal Swab      Status: None   Collection Time: 01/11/21  8:35 PM   Specimen: Nasopharyngeal Swab; Nasopharyngeal(NP) swabs in vial transport medium  Result Value Ref Range Status   SARS Coronavirus 2 by RT PCR NEGATIVE NEGATIVE Final    Comment: (NOTE) SARS-CoV-2 target nucleic acids are NOT DETECTED.  The SARS-CoV-2 RNA is generally detectable in upper respiratory specimens during the acute phase of infection. The lowest concentration of SARS-CoV-2 viral copies this assay can detect is 138 copies/mL. A negative result does not preclude SARS-Cov-2 infection and should not be used as the sole basis for treatment or other patient management decisions. A negative result may occur with  improper specimen collection/handling, submission of specimen other than nasopharyngeal swab, presence of viral mutation(s) within the areas targeted by this assay, and inadequate number of viral copies(<138 copies/mL). A negative result must be combined with clinical observations, patient history, and epidemiological information. The expected result is Negative.  Fact Sheet for Patients:  EntrepreneurPulse.com.au  Fact Sheet for Healthcare Providers:  IncredibleEmployment.be  This test is no t yet approved or cleared by the [place] FDA and  has been authorized for detection and/or diagnosis of SARS-CoV-2 by FDA under an Emergency Use Authorization (EUA). This EUA will remain  in effect (meaning this test can be used) for the duration of the COVID-19 declaration under Section 564(b)(1) of the Act, 21 U.S.C.section 360bbb-3(b)(1), unless the authorization is terminated  or revoked sooner.       Influenza A by PCR NEGATIVE NEGATIVE Final   Influenza B by PCR NEGATIVE NEGATIVE Final    Comment: (NOTE) The Xpert Xpress SARS-CoV-2/FLU/RSV plus assay is intended as an aid in the diagnosis of influenza from Nasopharyngeal swab specimens and should not be used as a sole basis  for treatment. Nasal washings and aspirates are unacceptable for Xpert Xpress SARS-CoV-2/FLU/RSV testing.  Fact Sheet for Patients: EntrepreneurPulse.com.au  Fact Sheet for Healthcare Providers: IncredibleEmployment.be  This test is not yet approved or cleared by the [place] FDA and has been authorized for detection and/or diagnosis of SARS-CoV-2 by FDA under an Emergency Use Authorization (EUA). This EUA will remain in effect (meaning this test can be used) for the duration of the COVID-19 declaration under Section 564(b)(1) of the Act, 21 U.S.C. section 360bbb-3(b)(1), unless the authorization is terminated or revoked.  Performed at [facility], [address] [place], [place] [address]          Radiology Studies: CT ANGIO CHEST PE W OR WO CONTRAST  Result Date: 01/13/2021 CLINICAL DATA:  Recent right lower extremity angiogram and thrombo lytic therapy. Recent hip fracture. PE suspected. EXAM: CT ANGIOGRAPHY CHEST WITH CONTRAST TECHNIQUE: Multidetector CT imaging of the chest was performed using the standard  protocol during bolus administration of intravenous contrast. Multiplanar CT image reconstructions and MIPs were obtained to evaluate the vascular anatomy. CONTRAST:  75mL OMNIPAQUE IOHEXOL 350 MG/ML SOLN COMPARISON:  Chest radiograph 01/11/2021.  CTA chest 10/21/2018. FINDINGS: Cardiovascular: The quality of this exam for evaluation of pulmonary embolism is good. Minimal motion degradation. The bolus is well timed. No evidence of pulmonary embolism. Aortic atherosclerosis. Normal heart size, without pericardial effusion. Lad coronary artery calcification. Mediastinum/Nodes: No supraclavicular adenopathy. Precarinal node of 1.4 cm is similar to on the prior exam. Subcarinal node measures 1.5 cm on 45/4, similar (when remeasured). Right hilar adenopathy at 1.7 cm on 44/4, 1.6 cm previously. Small hiatal hernia. Distal esophageal  wall thickening is moderate, similar to slightly increased. Lungs/Pleura: Trace bilateral pleural thickening. Moderate centrilobular emphysema. Lower lobe predominant bronchial wall thickening. Fluid or secretions in the right lower lobe endobronchial tree on 64/6. Minimal left base dependent atelectasis. Right base airspace disease. Upper Abdomen: Cholecystectomy. Scattered subcentimeter hyperenhancing foci within the liver are nonspecific, not readily apparent on the prior. Example in the right hepatic lobe on 83/4 and 92/4. Intrahepatic biliary duct dilatation is moderate and not significantly changed. Common duct measures 10 mm in the pancreatic head versus 8 mm at the same level on the prior. Pancreatic duct is borderline dilated, felt to be similar. Normal imaged portions of the spleen, stomach, right kidney. Upper pole left renal 3.1 cm cyst. Left adrenal thickening and nodularity is similar likely relates to hyperplasia. The right adrenal gland is minimally thickened. Musculoskeletal: Lower cervical spine fixation. Remote right rib fractures. Loss of vertebral body height at multiple thoracic levels without ventral canal encroachment. Felt to be similar. Review of the MIP images confirms the above findings. IMPRESSION: 1. No evidence of pulmonary embolism. 2. Right lower lobe airspace disease, suspicious for aspiration, especially given fluid/secretions in the right lower lobe endobronchial tree. 3. Similar to minimal increase in thoracic adenopathy, favored to be reactive. 4. Small hiatal hernia. Distal esophageal wall thickening suggests esophagitis. 5. Similar intrahepatic and similar to minimally increased common duct dilatation. Correlate with bilirubin levels. If elevated, consider MRCP. 6. Scattered subcentimeter hyperenhancing foci within the liver are not readily apparent on the prior. Most likely perfusion anomalies. If any history of primary malignancy or liver disease, consider outpatient pre and  post contrast abdominal MRI. 7. Aortic Atherosclerosis (ICD10-I70.0) and Emphysema (ICD10-J43.9). Coronary artery atherosclerosis. Electronically Signed   By: Abigail Miyamoto M.D.   On: 01/13/2021 18:58   DG C-Arm 1-60 Min  Result Date: 01/14/2021 CLINICAL DATA:  Femoral neck fracture. EXAM: DG C-ARM 1-60 MIN; LEFT FEMUR 2 VIEWS FLUOROSCOPY TIME:  Fluoroscopy Time:  20 seconds COMPARISON:  January 11, 2021 FINDINGS: For C-arm fluoroscopic images were obtained intraoperatively and submitted for post operative interpretation. These images demonstrate intramedullary nail fixation of a femoral neck fracture. Improved, near anatomic alignment. Please see the performing provider's procedural report for further detail. IMPRESSION: Intraoperative fluoroscopy, as detailed above. Electronically Signed   By: Margaretha Sheffield MD   On: 01/14/2021 16:33   DG FEMUR MIN 2 VIEWS LEFT  Result Date: 01/14/2021 CLINICAL DATA:  Femoral neck fracture. EXAM: DG C-ARM 1-60 MIN; LEFT FEMUR 2 VIEWS FLUOROSCOPY TIME:  Fluoroscopy Time:  20 seconds COMPARISON:  January 11, 2021 FINDINGS: For C-arm fluoroscopic images were obtained intraoperatively and submitted for post operative interpretation. These images demonstrate intramedullary nail fixation of a femoral neck fracture. Improved, near anatomic alignment. Please see the performing provider's procedural report for further  detail. IMPRESSION: Intraoperative fluoroscopy, as detailed above. Electronically Signed   By: Margaretha Sheffield MD   On: 01/14/2021 16:33        Scheduled Meds: . amLODipine  5 mg Oral Daily  . apixaban  5 mg Oral BID  . aspirin  81 mg Oral Daily  . atorvastatin  10 mg Oral Daily  . buPROPion  150 mg Oral Daily  . docusate sodium  100 mg Oral BID  . escitalopram  10 mg Oral Daily  . levothyroxine  88 mcg Oral QAC breakfast  . methocarbamol  750 mg Oral TID  . mometasone-formoterol  2 puff Inhalation BID  . montelukast  10 mg Oral Daily  .  tranexamic acid (CYKLOKAPRON) topical - INTRAOP  2,000 mg Topical 30 min Pre-Op  . traZODone  100 mg Oral QHS   Continuous Infusions: . sodium chloride 250 mL (01/14/21 0858)  . ampicillin-sulbactam (UNASYN) IV 3 g (01/15/21 0754)  . lactated ringers 1,000 mL (01/14/21 1728)     LOS: 4 days    Time spent: 25 minutes    Sidney Ace, MD [facility] Pager [phone]  If 7PM-7AM, please contact night-coverage 01/15/2021, 9:53 AM

## 2021-01-15 NOTE — Progress Notes (Signed)
   01/13/21 1627 01/13/21 1719  Assess: MEWS Score  Temp 98.3 F (36.8 C) 98.6 F (37 C)  BP (!) 104/54 (!) 102/57  Pulse Rate (!) 128 (!) 125 (notified nurse; [place] and [place])  Resp 17 18  SpO2 96 % 95 %  O2 Device Nasal Cannula Nasal Cannula  Assess: MEWS Score  MEWS Temp 0 0  MEWS Systolic 0 0  MEWS Pulse 2 2  MEWS RR 0 0  MEWS LOC 0 0  MEWS Score 2 2  MEWS Score Color Yellow Yellow  Assess: if the MEWS score is Yellow or Red  Were vital signs taken at a resting state? Yes Yes  Focused Assessment Change from prior assessment (see assessment flowsheet) No change from prior assessment  Early Detection of Sepsis Score *See Row Information* Low Low  MEWS guidelines implemented *See Row Information* Yes Yes  Treat  MEWS Interventions  --  Administered prn meds/treatments  2nd Pain Site  Pain Score 8  --   Take Vital Signs  Increase Vital Sign Frequency  Yellow: Q 2hr X 2 then Q 4hr X 2, if remains yellow, continue Q 4hrs  --   Escalate  MEWS: Escalate Yellow: discuss with charge nurse/RN and consider discussing with provider and RRT  --   Inserted for [employer] RN

## 2021-01-15 NOTE — Evaluation (Signed)
Occupational Therapy Evaluation Patient Details Name: [name] MRN: [id number] DOB: 1946/09/07 Today's Date: 01/15/2021    History of Present Illness Pt is a 75 y/o F who presents to ED from home for evaluation of L hip pain after a fall with minor head trauma but no LOC. Pt found to have L femur fx & underwent L IM nail placement by Dr. Harlow Mares on 01/14/21.   Clinical Impression   Pt seen for OT evaluation this in setting of acute hospitalization 2/2 fall, now s/p L IM nailing. Pt with c/o pain this date in both LEs. Pt reports that before all this happened, she was ambulatory with AD, but has been having increased difficulty since first surgery. On assessment this date, pt requires MIN A for sup to sit, MOD A for sit to sup to manage LEs. For ADL assessment, pt requires SETUP to MIN A with seated UB ADLs, MAX to TOTAL A with LB ADLs in sitting d/t percieved pain post-op'ly. Will continue to follow acutely. Anticipate pt will require f/u therapy rehabilitation at [facility] upon d/c from acute setting.     Follow Up Recommendations  SNF    Equipment Recommendations  Other (comment) (defer)    Recommendations for Other Services       Precautions / Restrictions Precautions Precautions: Fall Restrictions Weight Bearing Restrictions: Yes RLE Weight Bearing: Partial weight bearing RLE Partial Weight Bearing Percentage or Pounds: post op shoe LLE Weight Bearing: Weight bearing as tolerated      Mobility Bed Mobility Overal bed mobility: Needs Assistance Bed Mobility: Supine to Sit;Sit to Supine     Supine to sit: Min assist;HOB elevated Sit to supine: Supervision;HOB elevated   General bed mobility comments: assistance to move LLE To EOB with supine>sit    Transfers         General transfer comment: pt deferred attempts to stand with OT this date.    Balance Overall balance assessment: Needs assistance Sitting-balance support: Bilateral upper extremity supported;Feet  supported Sitting balance-Leahy Scale: Fair Sitting balance - Comments: supervision sitting EOB                ADL either performed or assessed with clinical judgement   ADL Overall ADL's : Needs assistance/impaired                   General ADL Comments: requires SETUP to MIN A with seated UB ADLs, MAX to TOTAL A with LB ADLs in sitting d/t percieved pain post-op'ly     Vision Baseline Vision/History: Wears glasses Wears Glasses: At all times Patient Visual Report: No change from baseline       Perception     Praxis      Pertinent Vitals/Pain Pain Assessment: Faces Faces Pain Scale: Hurts even more Pain Location: LLE Pain Descriptors / Indicators: Guarding;Grimacing Pain Intervention(s): Limited activity within patient's tolerance;Monitored during session;Repositioned;Relaxation (guided imagery and breathing techniques)     Hand Dominance Right   Extremity/Trunk Assessment Upper Extremity Assessment Upper Extremity Assessment: Generalized weakness (ROM WFL, MMT grossly 4-/5)   Lower Extremity Assessment Lower Extremity Assessment: Generalized weakness (pt with limited DF/PF rt ankle)       Communication Communication Communication: HOH   Cognition Arousal/Alertness: Awake/alert Behavior During Therapy: Anxious Overall Cognitive Status: No family/caregiver present to determine baseline cognitive functioning                 General Comments: Extremely HOH, anxious throughout session with OT implementing relaxation techniques including breathing  and guided imagery. RN providing anti-anxiety medication during session.   General Comments  HR 113 bpm - 133 bpm during session, pt on 2L/min via nasal cannula & SpO2 lowest 86% but increases to 90% or >, cuing for pursed lip breathing when pt c/o anxiety    Exercises Other Exercises Other Exercises: OT facilitates ed re: role of OT in acute setting, need for f/u upon d/c, importance of OOB activity,  potential for task modification including use of AE, and relaxation techniques including mindfullness, breathing tehcniques and guided imagery. Pt with good understanding but will benefit frmo f/u for carryover.   Shoulder Instructions      Home Living Family/patient expects to be discharged to:: Private residence Living Arrangements: Alone Available Help at Discharge: Family;Personal care attendant;Available PRN/intermittently Type of Home: House Home Access: Stairs to enter [employer] of Steps: 1-2 Entrance Stairs-Rails: None Home Layout: One level         Biochemist, clinical: Standard     Home Equipment: Environmental consultant - 2 wheels          Prior Functioning/Environment Level of Independence: Needs assistance (prior to initial surgery pt was ambulatory with RW, requiring more assistance following last admission)  Gait / Transfers Assistance Needed: Personal care attendant Kennyth Lose) reports that pt was able to walk from recliner to bathroom (~65feet) with RW and MIN A ADL's / Homemaking Assistance Needed: Requires assistance for cooking, cleaning            OT Problem List: Decreased strength;Decreased activity tolerance;Impaired balance (sitting and/or standing)      OT Treatment/Interventions:      OT Goals(Current goals can be found in the care plan section) Acute Rehab OT Goals Patient Stated Goal: To go home OT Goal Formulation: With patient Time For Goal Achievement: 01/29/21 Potential to Achieve Goals: Fair ADL Goals Pt Will Perform Lower Body Dressing: with adaptive equipment;sitting/lateral leans;with min assist Pt Will Transfer to Toilet: with mod assist;squat pivot transfer;bedside commode Pt Will Perform Toileting - Clothing Manipulation and hygiene: with mod assist;sitting/lateral leans Pt/caregiver will Perform Home Exercise Program: Increased strength;Both right and left upper extremity;With minimal assist  OT Frequency: Min 1X/week   Barriers to D/C:  Decreased caregiver support          Co-evaluation PT/OT/SLP Co-Evaluation/Treatment: Yes Reason for Co-Treatment: Complexity of the patient's impairments (multi-system involvement);For patient/therapist safety PT goals addressed during session: Mobility/safety with mobility OT goals addressed during session: ADL's and self-care      AM-PAC OT "6 Clicks" Daily Activity     Outcome Measure Help from another person eating meals?: A Little Help from another person taking care of personal grooming?: A Little Help from another person toileting, which includes using toliet, bedpan, or urinal?: A Lot Help from another person bathing (including washing, rinsing, drying)?: A Lot Help from another person to put on and taking off regular upper body clothing?: A Little Help from another person to put on and taking off regular lower body clothing?: A Lot 6 Click Score: 15   End of Session Equipment Utilized During Treatment: Gait belt;Rolling walker Nurse Communication: Mobility status  Activity Tolerance: Patient tolerated treatment well Patient left: in chair;with call bell/phone within reach;with chair alarm set  OT Visit Diagnosis: Unsteadiness on feet (R26.81);Muscle weakness (generalized) (M62.81)                Time: 5329-9242 OT Time Calculation (min): 38 min Charges:  OT Evaluation $OT Eval Moderate Complexity: 1 Mod OT Treatments $Self  Care/Home Management : 8-22 mins    Gerrianne Scale, [place], OTR/L ascom [phone] 01/15/21, 7:33 PM

## 2021-01-16 LAB — CBC WITH DIFFERENTIAL/PLATELET
Abs Immature Granulocytes: 0.05 10*3/uL (ref 0.00–0.07)
Basophils Absolute: 0 10*3/uL (ref 0.0–0.1)
Basophils Relative: 0 %
Eosinophils Absolute: 0 10*3/uL (ref 0.0–0.5)
Eosinophils Relative: 1 %
HCT: 24.4 % — ABNORMAL LOW (ref 36.0–46.0)
Hemoglobin: 7.7 g/dL — ABNORMAL LOW (ref 12.0–15.0)
Immature Granulocytes: 1 %
Lymphocytes Relative: 18 %
Lymphs Abs: 1.5 10*3/uL (ref 0.7–4.0)
MCH: 28.4 pg (ref 26.0–34.0)
MCHC: 31.6 g/dL (ref 30.0–36.0)
MCV: 90 fL (ref 80.0–100.0)
Monocytes Absolute: 0.6 10*3/uL (ref 0.1–1.0)
Monocytes Relative: 7 %
Neutro Abs: 6.1 10*3/uL (ref 1.7–7.7)
Neutrophils Relative %: 73 %
Platelets: 373 10*3/uL (ref 150–400)
RBC: 2.71 MIL/uL — ABNORMAL LOW (ref 3.87–5.11)
RDW: 14.8 % (ref 11.5–15.5)
WBC: 8.2 10*3/uL (ref 4.0–10.5)
nRBC: 0 % (ref 0.0–0.2)

## 2021-01-16 LAB — BASIC METABOLIC PANEL
Anion gap: 10 (ref 5–15)
BUN: 15 mg/dL (ref 8–23)
CO2: 26 mmol/L (ref 22–32)
Calcium: 8 mg/dL — ABNORMAL LOW (ref 8.9–10.3)
Chloride: 102 mmol/L (ref 98–111)
Creatinine, Ser: 0.52 mg/dL (ref 0.44–1.00)
GFR, Estimated: >60 mL/min (ref >60)
Glucose, Bld: 121 mg/dL — ABNORMAL HIGH (ref 70–99)
Potassium: 3.5 mmol/L (ref 3.5–5.1)
Sodium: 138 mmol/L (ref 135–145)

## 2021-01-16 MED ORDER — MORPHINE SULFATE (PF) 2 MG/ML IV SOLN
2.0000 mg | INTRAVENOUS | Status: DC | PRN
  Administered 2021-01-16 – 2021-01-18 (×6): 2 mg via INTRAVENOUS
  Filled 2021-01-16 (×6): qty 1

## 2021-01-16 NOTE — Progress Notes (Signed)
Physical Therapy Treatment Patient Details Name: [name] MRN: [id number] DOB: Apr 03, 1946 Today's Date: 01/16/2021    History of Present Illness Pt is a 75 y/o F who presents to ED from home for evaluation of L hip pain after a fall with minor head trauma but no LOC. Pt found to have L femur fx & underwent L IM nail placement by Dr. Harlow Mares on 01/14/21.    PT Comments    Pt premedicated before session but remains anxious over pain.  Participated in exercises as described below.  To EOB with mod a x 1.  Steady in sitting x 20 minutes unsupported.  She is able to stand x 2 during session.  On second attempt she is able to sidestep toward [facility] with min a x 1.  She refused OOB to chair.  HR does remain elevated.  Low 120's at rest increasing to 134 with standing attempts with slow recovery in sitting.  Encouraged OOB to chair tomorrow and she will "think about it."   Follow Up Recommendations  SNF;Supervision/Assistance - 24 hour     Equipment Recommendations  None recommended by PT    Recommendations for Other Services       Precautions / Restrictions Precautions Precautions: Fall Restrictions Weight Bearing Restrictions: Yes RLE Weight Bearing: Partial weight bearing LLE Weight Bearing: Weight bearing as tolerated    Mobility  Bed Mobility Overal bed mobility: Needs Assistance Bed Mobility: Supine to Sit;Sit to Supine     Supine to sit: Mod assist Sit to supine: Min assist   General bed mobility comments: assistance to move LLE To EOB with supine>sit    Transfers Overall transfer level: Needs assistance Equipment used: Rolling walker (2 wheeled) Transfers: Sit to/from Stand Sit to Stand: Min assist            Ambulation/Gait Ambulation/Gait assistance: Herbalist (Feet): 3 Feet Assistive device: Rolling walker (2 wheeled) Gait Pattern/deviations: Step-to pattern Gait velocity: decreased   General Gait Details: sidesteps to [facility] - refused OOB to  recliner   Stairs             Wheelchair Mobility    Modified Rankin (Stroke Patients Only)       Balance Overall balance assessment: Needs assistance Sitting-balance support: Bilateral upper extremity supported;Feet supported Sitting balance-Leahy Scale: Good Sitting balance - Comments: supervision sitting EOB   Standing balance support: During functional activity;Bilateral upper extremity supported Standing balance-Leahy Scale: Poor Standing balance comment: post lean                            Cognition Arousal/Alertness: Awake/alert Behavior During Therapy: WFL for tasks assessed/performed;Anxious Overall Cognitive Status: Within Functional Limits for tasks assessed                                        Exercises Other Exercises Other Exercises: BLE AAROM x 10 in supine    General Comments General comments (skin integrity, edema, etc.): HR up to 134 with standing.      Pertinent Vitals/Pain Pain Assessment: Faces Faces Pain Scale: Hurts even more Pain Location: LLE Pain Descriptors / Indicators: Guarding;Grimacing Pain Intervention(s): Limited activity within patient's tolerance;Monitored during session;Premedicated before session;Repositioned;Relaxation    Home Living  Prior Function            PT Goals (current goals can now be found in the care plan section) Progress towards PT goals: Progressing toward goals    Frequency    BID      PT Plan Current plan remains appropriate    Co-evaluation              AM-PAC PT "6 Clicks" Mobility   Outcome Measure  Help needed turning from your back to your side while in a flat bed without using bedrails?: A Little Help needed moving from lying on your back to sitting on the side of a flat bed without using bedrails?: A Little Help needed moving to and from a bed to a chair (including a wheelchair)?: A Lot Help needed standing up from  a chair using your arms (e.g., wheelchair or bedside chair)?: A Little Help needed to walk in hospital room?: A Lot Help needed climbing 3-5 steps with a railing? : Total 6 Click Score: 14    End of Session Equipment Utilized During Treatment: Oxygen;Gait belt Activity Tolerance: Patient tolerated treatment well;Patient limited by fatigue Patient left: in bed;with bed alarm set;with call bell/phone within reach   PT Visit Diagnosis: Difficulty in walking, not elsewhere classified (R26.2);Muscle weakness (generalized) (M62.81);Pain Pain - Right/Left: Left     Time: 3254-9826 PT Time Calculation (min) (ACUTE ONLY): 29 min  Charges:  $Therapeutic Exercise: 8-22 mins $Therapeutic Activity: 8-22 mins                    [name], PTA 01/16/21, 10:44 AM

## 2021-01-16 NOTE — TOC Progression Note (Signed)
Transition of Care [facility]) - Progression Note    Patient Details  Name: [name] MRN: [id number] Date of Birth: 05/24/1946  Transition of [facility] CM/SW Contact  Boris Sharper, LCSW Phone Number: 01/16/2021, 4:20 PM  Clinical Narrative:    CSW called to speak with pt regarding discharge plan. Pt was in agreement to going to [facility] but did not want to go to [facility]. CSW completed FL2 and faxed to surrounding facilities. Pt stated that she would like to stat close to her home if possible.   Expected Discharge Plan: Skilled Nursing Facility Barriers to Discharge: Continued Medical Work up  Expected Discharge Plan and Services Expected Discharge Plan: [place]       Living arrangements for the past 2 months: Single Family Home                                       Social Determinants of Health (SDOH) Interventions    Readmission Risk Interventions Readmission Risk Prevention Plan 01/14/2021  PCP or Specialist Appt within 3-5 Days Complete  HRI or [place] Complete  Social Work Consult for [place] Planning/Counseling Complete  Palliative Care Screening Not Applicable  Medication Review Press photographer) Complete  Some recent data might be hidden

## 2021-01-16 NOTE — Progress Notes (Signed)
PROGRESS NOTE    [name]  FTD:[id number] DOB: [birth year] DOA: 01/11/2021 PCP: Albina Billet, MD  Brief Narrative:  75 y.o. female with medical history significant for PAD s/p mechanical thrombectomy and stent angioplasty of the right lower extremity and third right toe amputation, COPD, chronic respiratory failure with hypoxia on 2 L supplemental O2 via [place] at all times, CAD, hyperlipidemia, depression/anxiety, hard of hearing who presents to the ED for evaluation of left hip pain after a fall.  Patient was recently hospitalized from 12/22/2020-12/31/2020 for gangrene of the third toe on the right foot.  She underwent angiogram of RLE on 12/23/2020 with mechanical thrombectomy and catheter directed thrombolytic therapy to the right SFA, popliteal artery, and anterior tibial artery.  S/p amputation of the third toe on the right foot 12/26/2020.  She has been on aspirin and Eliquis.  She was discharged to home with home health after SNF refusal.  Had a mechanical fall at home attributed to slick surgical bandages.  Minor head trauma but did not lose consciousness.  No other significant injuries.  Imaging on admission demonstrated a mildly impacted fracture of the left femoral neck  Orthopedics on consult.  Plan for operative fixation Friday, 01/14/2021.  Patient had acute onset of sinus tachycardia on 2/24.  Given concern for PE in the setting of held anticoagulation pursued CTA thorax.  No PE identified however there was evidence of a right basilar pneumonia, suspicious for aspiration.  2/26: Postop day 1 status post left intramedullary nail for intertrochanteric left femur fracture.  Tolerated procedure well without complications.  Is endorsing postoperative pain.  Assessment & Plan:   Principal Problem:   Fracture of femoral neck, left, closed (HCC) Active Problems:   HLD (hyperlipidemia)   Hypothyroidism   Anxiety and depression   Hypomagnesemia   Chronic obstructive pulmonary disease (COPD)  (HCC)   PAD (peripheral artery disease) (HCC)   Chronic respiratory failure with hypoxia (HCC)   Hypokalemia  Right base pneumonia, likely aspiration Patient had acute onset of tachycardia on 2/24 CT thorax revealed above Patient hemodynamically stable, afebrile Started on Unasyn Tachycardia improved Plan: Continue Unasyn Plan for 5-day antibiotic course   Sinus tachycardia Patient has episodes of elevated heart rate of unclear etiology No history of atrial fibrillation Episodes resolved with administration of IV metoprolol Patient remains relatively asymptomatic Plan: Telemetry Check echo  Left femoral neck fracture Status post chemical fall Patient unsure but likely last dose of Eliquis was 2/21 Orthopedics on consult Status post IM nail, 2/25 Postoperative pain control has been challenging Plan: Continue multimodal pain control Minimize use of IV narcotics Continue aspirin Eliquis Discussed with patient.  She is in agreement for skilled nursing facility.  TOC consulted.  COPD/chronic respiratory failure with hypoxia: Chronic and stable on home 2 L of home O2 via [place].   Continue Advair, Singulair, albuterol as needed.  Hypokalemia/hypomagnesemia: Supplement with IV magnesium followed by oral potassium.  PAD: S/p recent admission with angiogram of RLE on 12/23/2020 with mechanical thrombectomy and catheter directed thrombolytic therapy to the right SFA, popliteal artery, and anterior tibial artery.  S/p amputation of the third toe on the right foot 12/26/2020. -Resumed Eliquis and aspirin -Continue atorvastatin  Hypertension: Continue amlodipine.  Hypothyroidism: Continue Synthroid.  Hyperlipidemia: Atorvastatin.  Anxiety/depression: Continue Wellbutrin and Lexapro.    DVT prophylaxis: Eliquis Code Status: Full Family Communication: Raquel Sarna, daughter-in-law at [phone] on 2/26 Disposition Plan: Status is: Inpatient  Remains inpatient appropriate  because:Inpatient level of care appropriate  due to severity of illness   Dispo: The patient is from: Home              Anticipated d/c is to: SNF              Patient currently is not medically stable to d/c.   Difficult to place patient No   Postoperative day 2 status post IM nail for intramedullary femur fracture.  Postoperative pain control is been challenging.  Will attempt to wean from IV narcotics.  Once pain control is optimized plan on discharge to skilled nursing facility.          Level of care: Med-Surg  Consultants:   Orthopedics  Procedures:   None  Antimicrobials:   None   Subjective: Patient seen and examined.  Continues to endorse severe pain.  Isolated to affected lower extremity.  No other issues  Objective: Vitals:   01/16/21 0445 01/16/21 0653 01/16/21 0832 01/16/21 1134  BP: (!) 126/59 114/64 115/82 113/64  Pulse: (!) 108 99 (!) 113 100  Resp: (!) 22 (!) 22 20 18   Temp: 98 F (36.7 C) 98 F (36.7 C) 98.1 F (36.7 C) 98.1 F (36.7 C)  TempSrc: Oral     SpO2: 98% 100% 96% 92%  Weight:      Height:        Intake/Output Summary (Last 24 hours) at 01/16/2021 1217 Last data filed at 01/16/2021 0415 Gross per 24 hour  Intake 1093.34 ml  Output 1550 ml  Net -456.66 ml   Filed Weights   01/11/21 1842  Weight: 54.4 kg    Examination:  General exam: Appears calm and comfortable  Respiratory system: Clear to auscultation. Respiratory effort normal. Cardiovascular system: S1 & S2 heard, RRR. No JVD, murmurs, rubs, gallops or clicks. No pedal edema. Gastrointestinal system: Abdomen is nondistended, soft and nontender. No organomegaly or masses felt. Normal bowel sounds heard. Central nervous system: Alert and oriented. No focal neurological deficits. Extremities: Decreased power LLE, left leg shortened and externally rotated Skin: R distal second toe with dry necrotic appearance.  S/p right third toe amputation Psychiatry: Judgement and  insight appear normal. Mood & affect appropriate.     Data Reviewed: I have personally reviewed following labs and imaging studies  CBC: Recent Labs  Lab 01/11/21 1842 01/12/21 0453 01/13/21 1208 01/14/21 0729 01/15/21 0727 01/16/21 0630  WBC 9.5 9.1 14.0* 11.8* 9.3 8.2  NEUTROABS 7.3  --   --  9.8* 7.9* 6.1  HGB 11.4* 9.6* 9.9* 8.8* 8.3* 7.7*  HCT 34.9* 29.9* 31.7* 28.6* 25.9* 24.4*  MCV 88.6 89.3 91.4 91.7 90.6 90.0  PLT 389 287 264 324 264 834   Basic Metabolic Panel: Recent Labs  Lab 01/11/21 1842 01/12/21 0453 01/13/21 1208 01/14/21 0729 01/15/21 0727 01/16/21 0630  NA 136 137 136 136 138 138  K 3.2* 2.8* 3.9 3.7 4.6 3.5  CL 100 101 100 100 104 102  CO2 25 30 25 27 25 26   GLUCOSE 114* 134* 102* 103* 144* 121*  BUN 13 14 10 11 14 15   CREATININE 0.62 0.66 0.60 0.48 0.56 0.52  CALCIUM 8.6* 8.2* 8.5* 8.4* 8.3* 8.0*  MG 1.6* 2.3  --   --   --   --    GFR: Estimated Creatinine Clearance: 53.8 mL/min (by C-G formula based on SCr of 0.52 mg/dL). Liver Function Tests: No results for input(s): AST, ALT, ALKPHOS, BILITOT, PROT, ALBUMIN in the last 168 hours. No results for input(s): LIPASE,  AMYLASE in the last 168 hours. No results for input(s): AMMONIA in the last 168 hours. Coagulation Profile: Recent Labs  Lab 01/11/21 1958 01/13/21 0816 01/14/21 0729  INR 1.2 1.1 1.2   Cardiac Enzymes: No results for input(s): CKTOTAL, CKMB, CKMBINDEX, TROPONINI in the last 168 hours. BNP (last 3 results) No results for input(s): PROBNP in the last 8760 hours. HbA1C: No results for input(s): HGBA1C in the last 72 hours. CBG: No results for input(s): GLUCAP in the last 168 hours. Lipid Profile: No results for input(s): CHOL, HDL, LDLCALC, TRIG, CHOLHDL, LDLDIRECT in the last 72 hours. Thyroid Function Tests: No results for input(s): TSH, T4TOTAL, FREET4, T3FREE, THYROIDAB in the last 72 hours. Anemia Panel: No results for input(s): VITAMINB12, FOLATE, FERRITIN, TIBC,  IRON, RETICCTPCT in the last 72 hours. Sepsis Labs: Recent Labs  Lab 01/13/21 1208  PROCALCITON 0.36    Recent Results (from the past 240 hour(s))  Resp Panel by RT-PCR (Flu A&B, Covid) Nasopharyngeal Swab     Status: None   Collection Time: 01/11/21  8:35 PM   Specimen: Nasopharyngeal Swab; Nasopharyngeal(NP) swabs in vial transport medium  Result Value Ref Range Status   SARS Coronavirus 2 by RT PCR NEGATIVE NEGATIVE Final    Comment: (NOTE) SARS-CoV-2 target nucleic acids are NOT DETECTED.  The SARS-CoV-2 RNA is generally detectable in upper respiratory specimens during the acute phase of infection. The lowest concentration of SARS-CoV-2 viral copies this assay can detect is 138 copies/mL. A negative result does not preclude SARS-Cov-2 infection and should not be used as the sole basis for treatment or other patient management decisions. A negative result may occur with  improper specimen collection/handling, submission of specimen other than nasopharyngeal swab, presence of viral mutation(s) within the areas targeted by this assay, and inadequate number of viral copies(<138 copies/mL). A negative result must be combined with clinical observations, patient history, and epidemiological information. The expected result is Negative.  Fact Sheet for Patients:  EntrepreneurPulse.com.au  Fact Sheet for Healthcare Providers:  IncredibleEmployment.be  This test is no t yet approved or cleared by the [place] FDA and  has been authorized for detection and/or diagnosis of SARS-CoV-2 by FDA under an Emergency Use Authorization (EUA). This EUA will remain  in effect (meaning this test can be used) for the duration of the COVID-19 declaration under Section 564(b)(1) of the Act, 21 U.S.C.section 360bbb-3(b)(1), unless the authorization is terminated  or revoked sooner.       Influenza A by PCR NEGATIVE NEGATIVE Final   Influenza B by PCR  NEGATIVE NEGATIVE Final    Comment: (NOTE) The Xpert Xpress SARS-CoV-2/FLU/RSV plus assay is intended as an aid in the diagnosis of influenza from Nasopharyngeal swab specimens and should not be used as a sole basis for treatment. Nasal washings and aspirates are unacceptable for Xpert Xpress SARS-CoV-2/FLU/RSV testing.  Fact Sheet for Patients: EntrepreneurPulse.com.au  Fact Sheet for Healthcare Providers: IncredibleEmployment.be  This test is not yet approved or cleared by the [place] FDA and has been authorized for detection and/or diagnosis of SARS-CoV-2 by FDA under an Emergency Use Authorization (EUA). This EUA will remain in effect (meaning this test can be used) for the duration of the COVID-19 declaration under Section 564(b)(1) of the Act, 21 U.S.C. section 360bbb-3(b)(1), unless the authorization is terminated or revoked.  Performed at [facility], [address], [place], [place] [address]          Radiology Studies: DG C-Arm 1-60 Min  Result  Date: 01/14/2021 CLINICAL DATA:  Femoral neck fracture. EXAM: DG C-ARM 1-60 MIN; LEFT FEMUR 2 VIEWS FLUOROSCOPY TIME:  Fluoroscopy Time:  20 seconds COMPARISON:  January 11, 2021 FINDINGS: For C-arm fluoroscopic images were obtained intraoperatively and submitted for post operative interpretation. These images demonstrate intramedullary nail fixation of a femoral neck fracture. Improved, near anatomic alignment. Please see the performing provider's procedural report for further detail. IMPRESSION: Intraoperative fluoroscopy, as detailed above. Electronically Signed   By: Margaretha Sheffield MD   On: 01/14/2021 16:33   DG FEMUR MIN 2 VIEWS LEFT  Result Date: 01/14/2021 CLINICAL DATA:  Femoral neck fracture. EXAM: DG C-ARM 1-60 MIN; LEFT FEMUR 2 VIEWS FLUOROSCOPY TIME:  Fluoroscopy Time:  20 seconds COMPARISON:  January 11, 2021 FINDINGS: For C-arm fluoroscopic images were  obtained intraoperatively and submitted for post operative interpretation. These images demonstrate intramedullary nail fixation of a femoral neck fracture. Improved, near anatomic alignment. Please see the performing provider's procedural report for further detail. IMPRESSION: Intraoperative fluoroscopy, as detailed above. Electronically Signed   By: Margaretha Sheffield MD   On: 01/14/2021 16:33        Scheduled Meds: . amLODipine  5 mg Oral Daily  . apixaban  5 mg Oral BID  . aspirin EC  81 mg Oral Daily  . atorvastatin  10 mg Oral Daily  . buPROPion  150 mg Oral Daily  . docusate sodium  100 mg Oral BID  . escitalopram  10 mg Oral Daily  . levothyroxine  88 mcg Oral QAC breakfast  . methocarbamol  750 mg Oral TID  . mometasone-formoterol  2 puff Inhalation BID  . montelukast  10 mg Oral Daily  . tranexamic acid (CYKLOKAPRON) topical - INTRAOP  2,000 mg Topical 30 min Pre-Op  . traZODone  100 mg Oral QHS   Continuous Infusions: . sodium chloride 250 mL (01/14/21 0858)  . sodium chloride 10 mL/hr at 01/16/21 0415  . ampicillin-sulbactam (UNASYN) IV 3 g (01/16/21 0923)  . lactated ringers Stopped (01/15/21 1446)     LOS: 5 days    Time spent: 15 minutes    Sidney Ace, MD [facility] Pager [phone]  If 7PM-7AM, please contact night-coverage 01/16/2021, 12:17 PM

## 2021-01-16 NOTE — Plan of Care (Signed)
Pt Axox4. Calm and cooperative and able to voice her needs. Anxious about getting her pain meds. Prn pain meds administered around the clock. Safety measures in place. Will continue to monitor.  Problem: Health Behavior/Discharge Planning: Goal: Ability to manage health-related needs will improve Outcome: Progressing   Problem: Clinical Measurements: Goal: Will remain free from infection Outcome: Progressing   Problem: Education: Goal: Verbalization of understanding the information provided (i.e., activity precautions, restrictions, etc) will improve Outcome: Progressing Goal: Individualized Educational Video(s) Outcome: Progressing   Problem: Activity: Goal: Ability to ambulate and perform ADLs will improve Outcome: Progressing   Problem: Clinical Measurements: Goal: Postoperative complications will be avoided or minimized Outcome: Progressing   Problem: Self-Concept: Goal: Ability to maintain and perform role responsibilities to the fullest extent possible will improve Outcome: Progressing   Problem: Pain Management: Goal: Pain level will decrease Outcome: Progressing

## 2021-01-16 NOTE — Progress Notes (Signed)
   01/16/21 0342 01/16/21 0445  Assess: MEWS Score  Temp 97.9 F (36.6 C) 98 F (36.7 C)  BP 125/68 (!) 126/59  Pulse Rate (!) 117 (!) 108  Resp 20 (!) 22  Level of Consciousness  --  Alert  SpO2 95 % 98 %  O2 Device Nasal Cannula Nasal Cannula  Patient Activity (if Appropriate) In bed  --   O2 Flow Rate (L/min) 2 L/min  --   Assess: MEWS Score  MEWS Temp 0 0  MEWS Systolic 0 0  MEWS Pulse 2 1  MEWS RR 0 1  MEWS LOC 0 0  MEWS Score 2 2  MEWS Score Color Yellow Yellow

## 2021-01-16 NOTE — Progress Notes (Signed)
Subjective: 2 Days Post-Op Procedure(s) (LRB): INTRAMEDULLARY (IM) NAIL INTERTROCHANTRIC (Left) Patient is sitting up and alert.  Eating supper.  Appears to be comfortable.  Dressing is dry.  Hip is stable.  Hemoglobin is 7.7.  Patient reports pain as mild.  Objective:   VITALS:   Vitals:   01/16/21 1134 01/16/21 1538  BP: 113/64 119/67  Pulse: 100 (!) 104  Resp: 18 20  Temp: 98.1 F (36.7 C) 99 F (37.2 C)  SpO2: 92% 99%    Neurologically intact Dorsiflexion/Plantar flexion intact Incision: dressing C/D/I  LABS Recent Labs    01/14/21 0729 01/15/21 0727 01/16/21 0630  HGB 8.8* 8.3* 7.7*  HCT 28.6* 25.9* 24.4*  WBC 11.8* 9.3 8.2  PLT 324 264 373    Recent Labs    01/14/21 0729 01/15/21 0727 01/16/21 0630  NA 136 138 138  K 3.7 4.6 3.5  BUN 11 14 15   CREATININE 0.48 0.56 0.52  GLUCOSE 103* 144* 121*    Recent Labs    01/14/21 0729  INR 1.2     Assessment/Plan: 2 Days Post-Op Procedure(s) (LRB): INTRAMEDULLARY (IM) NAIL INTERTROCHANTRIC (Left)   Up with therapy Discharge to SNF

## 2021-01-16 NOTE — NC FL2 (Signed)
[place] LEVEL OF CARE SCREENING TOOL     IDENTIFICATION  Patient Name: [name] Birthdate: 1946/10/04 Sex: female Admission Date (Current Location): 01/11/2021  [place] and [place] Number:  Engineering geologist and Address:  [facility], [address], [place], [place] [address]      Provider Number: [id number]  Attending Physician Name and Address:  Sidney Ace, MD  Relative Name and Phone Number:  [phone]    Current Level of Care: Hospital Recommended Level of Care: [place] Prior Approval Number:    Date Approved/Denied:   PASRR Number:    Discharge Plan: SNF    Current Diagnoses: Patient Active Problem List   Diagnosis Date Noted  . Fracture of femoral neck, left, closed ([place]) 01/11/2021  . Chronic respiratory failure with hypoxia ([place]) 01/11/2021  . Hypokalemia 01/11/2021  . 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 PAD (peripheral artery disease) ([place]) 12/22/2020  . Encounter for long-term opiate analgesic use 11/11/2020  . Dry gangrene ([place]) of middle toe, right foot 11/11/2020  . Foot pain, right 11/11/2020  . Hard of hearing 11/11/2020  . Atherosclerosis of native arteries of the extremities with gangrene ([place]) 12/09/2019  . Vitamin D deficiency 11/12/2019  . Pharmacologic therapy 06/04/2019  . Disorder of skeletal system 06/04/2019  . Problems influencing health status 06/04/2019  . Sepsis ([place]) 10/22/2018  . HCAP (healthcare-associated pneumonia) 10/22/2018  . Compression fracture of L3 vertebra ([place]) 08/19/2018  . Acute UTI 08/18/2018  . Chronic hip pain (Bilateral) 12/31/2017  . Neurogenic pain 08/29/2017  . Chronic low back pain (1ry area of Pain) (Right) w/o sciatica 08/29/2017  . Chronic sacroiliac joint pain (Right) 06/05/2017  . Vitamin D insufficiency 03/12/2017  . Right hip pain 02/20/2017  . Intertrochanteric fracture of right hip, sequela  02/20/2017  . B12 deficiency 02/05/2017  . Pressure injury of skin 10/02/2016  . Closed displaced intertrochanteric fracture of right femur ([place]) 10/02/2016  . Hip fracture ([place]) 10/01/2016  . Cervical facet hypertrophy (Bilateral) 05/27/2016  . History of shoulder surgery 5 (Right) 05/27/2016  . Lumbar foraminal stenosis (L3-4) (Left) 05/27/2016  . 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 Lumbar facet hypertrophy (Bilateral) 05/27/2016  . Lumbar facet syndrome (Right) 05/27/2016  . Lumbar grade 1 Anterolisthesis of L3 over L4 05/27/2016  . Chronic shoulder pain (Bilateral) (status post multiple surgeries) (R>L) 12/08/2015  . Substance use disorder Risk: High 09/14/2015  . Chronic pain syndrome 09/14/2015  . Cervical spondylosis 09/14/2015  . Chronic neck pain (2ry area of Pain) (Bilateral) (R>L) 09/14/2015  . 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 Cervical facet syndrome (Bilateral) (R>L) 09/14/2015  . Cervical myofascial pain syndrome 09/14/2015  . Lumbar spondylosis 09/14/2015  . Chronic shoulder impingement syndrome (Right) 09/14/2015  . Hypomagnesemia 09/14/2015  . CRP elevated 09/14/2015  . Elevated sedimentation rate 09/14/2015  . Chronic obstructive pulmonary disease (COPD) ([place]) 09/14/2015  . Nicotine dependence 09/14/2015  . Chronic shoulder pain (Right) 09/14/2015  . Abnormal nerve conduction studies 09/14/2015  . Encounter for therapeutic drug level monitoring 09/09/2015  . Long term current use of opiate analgesic 09/09/2015  . Long term prescription opiate use 09/09/2015  . Uncomplicated opioid dependence ([place]) 09/09/2015  . Opiate use 09/09/2015  . Anxiety and depression 07/11/2012  . Tobacco use disorder 05/27/2011  . Fatigue 05/27/2011  . Hypothyroidism 11/25/2010  . HLD (hyperlipidemia) 08/24/2010  .  Tachycardia 08/24/2010  . DYSPNEA 08/24/2010    Orientation RESPIRATION BLADDER Height &  Weight     Self,Time,Situation,Place  O2 (2L) Continent Weight: 120 lb (54.4 kg) Height:  5\' 4"  (162.6 cm)  BEHAVIORAL SYMPTOMS/MOOD NEUROLOGICAL BOWEL NUTRITION STATUS      Continent    AMBULATORY STATUS COMMUNICATION OF NEEDS Skin   Limited Assist Verbally Surgical wounds,Normal (Left femur fracture)                       Personal Care Assistance Level of Assistance  Bathing,Feeding,Dressing Bathing Assistance: Limited assistance Feeding assistance: Limited assistance Dressing Assistance: Limited assistance     Functional Limitations Info  Sight,Hearing,Speech Sight Info: Adequate Hearing Info: Adequate Speech Info: Adequate    SPECIAL CARE FACTORS FREQUENCY  PT (By licensed PT),OT (By licensed OT)     PT Frequency: 5x week OT Frequency: 5x week            Contractures Contractures Info: Not present    Additional Factors Info  Code Status Code Status Info: Full             Current Medications (01/16/2021):  This is the current hospital active medication list Current Facility-Administered Medications  Medication Dose Route Frequency Provider Last Rate Last Admin  . 0.9 %  sodium chloride infusion   Intravenous PRN Lovell Sheehan, MD 10 mL/hr at 01/14/21 0858 250 mL at 01/14/21 0858  . 0.9 %  sodium chloride infusion   Intravenous PRN Ralene Muskrat B, MD 10 mL/hr at 01/16/21 0415 Infusion Verify at 01/16/21 0415  . acetaminophen (TYLENOL) tablet 650 mg  650 mg Oral Q6H PRN Ralene Muskrat B, MD   650 mg at 01/15/21 1108  . albuterol (VENTOLIN HFA) 108 (90 Base) MCG/ACT inhaler 1-2 puff  1-2 puff Inhalation Q6H PRN Lovell Sheehan, MD      . ALPRAZolam Duanne Moron) tablet 0.5 mg  0.5 mg Oral TID PRN Lovell Sheehan, MD   0.5 mg at 01/16/21 1520  . amLODipine (NORVASC) tablet 5 mg  5 mg Oral Daily Lovell Sheehan, MD   5 mg at 01/16/21 6073  . Ampicillin-Sulbactam (UNASYN) 3 g in sodium chloride 0.9 % 100 mL IVPB  3 g Intravenous Q6H Sreenath, Sudheer B, MD  200 mL/hr at 01/16/21 1524 3 g at 01/16/21 1524  . apixaban (ELIQUIS) tablet 5 mg  5 mg Oral BID Ralene Muskrat B, MD   5 mg at 01/16/21 7106  . aspirin EC tablet 81 mg  81 mg Oral Daily Ralene Muskrat B, MD   81 mg at 01/16/21 2694  . atorvastatin (LIPITOR) tablet 10 mg  10 mg Oral Daily Lovell Sheehan, MD   10 mg at 01/16/21 8546  . buPROPion (WELLBUTRIN SR) 12 hr tablet 150 mg  150 mg Oral Daily Lovell Sheehan, MD   150 mg at 01/16/21 2703  . docusate sodium (COLACE) capsule 100 mg  100 mg Oral BID Lovell Sheehan, MD   100 mg at 01/16/21 5009  . escitalopram (LEXAPRO) tablet 10 mg  10 mg Oral Daily Lovell Sheehan, MD   10 mg at 01/16/21 3818  . HYDROcodone-acetaminophen (NORCO/VICODIN) 5-325 MG per tablet 1-2 tablet  1-2 tablet Oral Q6H PRN Lovell Sheehan, MD   2 tablet at 01/16/21 1154  . labetalol (NORMODYNE) injection 10 mg  10 mg Intravenous Q2H PRN Sreenath, Sudheer B, MD      . lactated ringers infusion  Intravenous Continuous Lovell Sheehan, MD   Stopped at 01/15/21 1446  . levothyroxine (SYNTHROID) tablet 88 mcg  88 mcg Oral QAC breakfast Lovell Sheehan, MD   88 mcg at 01/16/21 0539  . methocarbamol (ROBAXIN) tablet 750 mg  750 mg Oral TID Lovell Sheehan, MD   750 mg at 01/16/21 1520  . metoCLOPramide (REGLAN) tablet 5-10 mg  5-10 mg Oral Q8H PRN Lovell Sheehan, MD       Or  . metoCLOPramide (REGLAN) injection 5-10 mg  5-10 mg Intravenous Q8H PRN Lovell Sheehan, MD      . mometasone-formoterol [facility]) 200-5 MCG/ACT inhaler 2 puff  2 puff Inhalation BID Lovell Sheehan, MD   2 puff at 01/16/21 [phone]  . montelukast (SINGULAIR) tablet 10 mg  10 mg Oral Daily Lovell Sheehan, MD   10 mg at 01/15/21 2219  . morphine 2 MG/ML injection 2 mg  2 mg Intravenous Q4H PRN Ralene Muskrat B, MD   2 mg at 01/16/21 1520  . ondansetron (ZOFRAN) tablet 4 mg  4 mg Oral Q6H PRN Lovell Sheehan, MD       Or  . ondansetron [facility]) injection 4 mg  4 mg Intravenous Q6H PRN Lovell Sheehan,  MD      . senna-docusate (Senokot-S) tablet 1 tablet  1 tablet Oral QHS PRN Lovell Sheehan, MD   1 tablet at 01/13/21 2131  . tranexamic acid (CYKLOKAPRON) 2,000 mg in sodium chloride 0.9 % 50 mL Topical Application  9,604 mg Topical 30 min Pre-Op Lovell Sheehan, MD      . traZODone (DESYREL) tablet 100 mg  100 mg Oral QHS Lovell Sheehan, MD   100 mg at 01/15/21 2219     Discharge Medications: Please see discharge summary for a list of discharge medications.  Relevant Imaging Results:  Relevant Lab Results:   Additional Information SS: [id number]  Boris Sharper, LCSW

## 2021-01-17 ENCOUNTER — Encounter: Payer: Self-pay | Admitting: Orthopedic Surgery

## 2021-01-17 ENCOUNTER — Inpatient Hospital Stay (HOSPITAL_COMMUNITY)
Admit: 2021-01-17 | Discharge: 2021-01-17 | Disposition: A | Discharge status: Home / Self Care | Payer: Medicare HMO | Attending: Internal Medicine | Admitting: Internal Medicine

## 2021-01-17 DIAGNOSIS — I4891 Unspecified atrial fibrillation: Secondary | ICD-10-CM | POA: Diagnosis not present

## 2021-01-17 LAB — ECHOCARDIOGRAM COMPLETE
AR max vel: 2.31 cm2
AV Area VTI: 3.32 cm2
AV Area mean vel: 2.81 cm2
AV Mean grad: 3 mmHg
AV Peak grad: 7.3 mmHg
Ao pk vel: 1.35 m/s
Area-P 1/2: 5.93 cm2
Height: 64 in
S' Lateral: 2.23 cm
Weight: 1920 oz

## 2021-01-17 LAB — CBC WITH DIFFERENTIAL/PLATELET
Abs Immature Granulocytes: 0.05 10*3/uL (ref 0.00–0.07)
Basophils Absolute: 0 10*3/uL (ref 0.0–0.1)
Basophils Relative: 0 %
Eosinophils Absolute: 0.2 10*3/uL (ref 0.0–0.5)
Eosinophils Relative: 3 %
HCT: 27.1 % — ABNORMAL LOW (ref 36.0–46.0)
Hemoglobin: 8.4 g/dL — ABNORMAL LOW (ref 12.0–15.0)
Immature Granulocytes: 1 %
Lymphocytes Relative: 30 %
Lymphs Abs: 2.1 10*3/uL (ref 0.7–4.0)
MCH: 28.6 pg (ref 26.0–34.0)
MCHC: 31 g/dL (ref 30.0–36.0)
MCV: 92.2 fL (ref 80.0–100.0)
Monocytes Absolute: 0.6 10*3/uL (ref 0.1–1.0)
Monocytes Relative: 9 %
Neutro Abs: 4 10*3/uL (ref 1.7–7.7)
Neutrophils Relative %: 57 %
Platelets: 384 10*3/uL (ref 150–400)
RBC: 2.94 MIL/uL — ABNORMAL LOW (ref 3.87–5.11)
RDW: 14.9 % (ref 11.5–15.5)
WBC: 6.9 10*3/uL (ref 4.0–10.5)
nRBC: 0 % (ref 0.0–0.2)

## 2021-01-17 LAB — BASIC METABOLIC PANEL
Anion gap: 8 (ref 5–15)
BUN: 14 mg/dL (ref 8–23)
CO2: 30 mmol/L (ref 22–32)
Calcium: 8.3 mg/dL — ABNORMAL LOW (ref 8.9–10.3)
Chloride: 104 mmol/L (ref 98–111)
Creatinine, Ser: 0.67 mg/dL (ref 0.44–1.00)
GFR, Estimated: >60 mL/min (ref >60)
Glucose, Bld: 92 mg/dL (ref 70–99)
Potassium: 3.9 mmol/L (ref 3.5–5.1)
Sodium: 142 mmol/L (ref 135–145)

## 2021-01-17 MED ORDER — PREGABALIN 25 MG PO CAPS
75.0000 mg | ORAL_CAPSULE | Freq: Two times a day (BID) | ORAL | Status: DC
  Administered 2021-01-17 – 2021-01-20 (×7): 75 mg via ORAL
  Filled 2021-01-17 (×3): qty 1
  Filled 2021-01-17: qty 3
  Filled 2021-01-17 (×3): qty 1

## 2021-01-17 NOTE — Progress Notes (Signed)
PROGRESS NOTE    [name]  WFU:[id number] DOB: 06/09/1946 DOA: 01/11/2021 PCP: Albina Billet, MD  Brief Narrative:  75 y.o. female with medical history significant for PAD s/p mechanical thrombectomy and stent angioplasty of the right lower extremity and third right toe amputation, COPD, chronic respiratory failure with hypoxia on 2 L supplemental O2 via [place] at all times, CAD, hyperlipidemia, depression/anxiety, hard of hearing who presents to the ED for evaluation of left hip pain after a fall.  Patient was recently hospitalized from 12/22/2020-12/31/2020 for gangrene of the third toe on the right foot.  She underwent angiogram of RLE on 12/23/2020 with mechanical thrombectomy and catheter directed thrombolytic therapy to the right SFA, popliteal artery, and anterior tibial artery.  S/p amputation of the third toe on the right foot 12/26/2020.  She has been on aspirin and Eliquis.  She was discharged to home with home health after SNF refusal.  Had a mechanical fall at home attributed to slick surgical bandages.  Minor head trauma but did not lose consciousness.  No other significant injuries.  Imaging on admission demonstrated a mildly impacted fracture of the left femoral neck  Orthopedics on consult.  Plan for operative fixation Friday, 01/14/2021.  Patient had acute onset of sinus tachycardia on 2/24.  Given concern for PE in the setting of held anticoagulation pursued CTA thorax.  No PE identified however there was evidence of a right basilar pneumonia, suspicious for aspiration.  2/28: Pain control improving.  Mobility with physical therapy slowly improving  Assessment & Plan:   Principal Problem:   Fracture of femoral neck, left, closed (HCC) Active Problems:   HLD (hyperlipidemia)   Hypothyroidism   Anxiety and depression   Hypomagnesemia   Chronic obstructive pulmonary disease (COPD) (HCC)   PAD (peripheral artery disease) (HCC)   Chronic respiratory failure with hypoxia (HCC)    Hypokalemia  Right base pneumonia, likely aspiration Patient had acute onset of tachycardia on 2/24 CT thorax revealed above Patient hemodynamically stable, afebrile Started on Unasyn Tachycardia improved Plan: Continue Unasyn, last day of 5-day course today  Sinus tachycardia Patient has episodes of elevated heart rate of unclear etiology No history of atrial fibrillation Episodes resolved with administration of IV metoprolol Patient remains relatively asymptomatic Plan: Telemetry 2D echocardiogram, completed and results pending  Left femoral neck fracture Status post chemical fall Patient unsure but likely last dose of Eliquis was 2/21 Orthopedics on consult Status post IM nail, 2/25 Postoperative pain control has been challenging Plan: Continue multimodal pain control Minimize use of IV narcotics Continue aspirin Eliquis Discussed with patient.  She is in agreement for skilled nursing facility.  TOC consulted.  COPD/chronic respiratory failure with hypoxia: Chronic and stable on home 2 L of home O2 via [place].   Continue Advair, Singulair, albuterol as needed.  Hypokalemia/hypomagnesemia: Supplement with IV magnesium followed by oral potassium.  PAD: S/p recent admission with angiogram of RLE on 12/23/2020 with mechanical thrombectomy and catheter directed thrombolytic therapy to the right SFA, popliteal artery, and anterior tibial artery.  S/p amputation of the third toe on the right foot 12/26/2020. -Resumed Eliquis and aspirin -Continue atorvastatin  Hypertension: Continue amlodipine.  Hypothyroidism: Continue Synthroid.  Hyperlipidemia: Atorvastatin.  Anxiety/depression: Continue Wellbutrin and Lexapro.    DVT prophylaxis: Eliquis Code Status: Full Family Communication: Raquel Sarna, daughter-in-law at [phone] on 2/26 Disposition Plan: Status is: Inpatient  Remains inpatient appropriate because:Inpatient level of care appropriate due to severity of  illness   Dispo: The patient  is from: Home              Anticipated d/c is to: SNF              Patient currently is not medically stable to d/c.   Difficult to place patient No   Postoperative day #3 status post IM nail for intramedullary femur fracture.  Patient agreeable to skilled nursing facility.  TOC aware.  Bed search in progress.          Level of care: Med-Surg  Consultants:   Orthopedics  Procedures:   None  Antimicrobials:   None   Subjective: Patient seen and examined.  Pain control improving slowly  Objective: Vitals:   01/16/21 2030 01/17/21 0037 01/17/21 0354 01/17/21 0756  BP: 115/71 124/84 107/71 122/71  Pulse: (!) 110 (!) 102 (!) 104 96  Resp: 18 18 18 16   Temp: 97.8 F (36.6 C) 97.8 F (36.6 C) 97.8 F (36.6 C) 97.8 F (36.6 C)  TempSrc:      SpO2: 100% 100% 99% 100%  Weight:      Height:        Intake/Output Summary (Last 24 hours) at 01/17/2021 1111 Last data filed at 01/17/2021 1040 Gross per 24 hour  Intake 480 ml  Output 1700 ml  Net -1220 ml   Filed Weights   01/11/21 1842  Weight: 54.4 kg    Examination:  General exam: Appears calm and comfortable  Respiratory system: Clear to auscultation. Respiratory effort normal. Cardiovascular system: S1 & S2 heard, RRR. No JVD, murmurs, rubs, gallops or clicks. No pedal edema. Gastrointestinal system: Abdomen is nondistended, soft and nontender. No organomegaly or masses felt. Normal bowel sounds heard. Central nervous system: Alert and oriented. No focal neurological deficits. Extremities: Decreased power LLE, left leg shortened and externally rotated Skin: R distal second toe with dry necrotic appearance.  S/p right third toe amputation Psychiatry: Judgement and insight appear normal. Mood & affect appropriate.     Data Reviewed: I have personally reviewed following labs and imaging studies  CBC: Recent Labs  Lab 01/11/21 1842 01/12/21 0453 01/13/21 1208  01/14/21 0729 01/15/21 0727 01/16/21 0630 01/17/21 0617  WBC 9.5   < > 14.0* 11.8* 9.3 8.2 6.9  NEUTROABS 7.3  --   --  9.8* 7.9* 6.1 4.0  HGB 11.4*   < > 9.9* 8.8* 8.3* 7.7* 8.4*  HCT 34.9*   < > 31.7* 28.6* 25.9* 24.4* 27.1*  MCV 88.6   < > 91.4 91.7 90.6 90.0 92.2  PLT 389   < > 264 324 264 373 384   < > = values in this interval not displayed.   Basic Metabolic Panel: Recent Labs  Lab 01/11/21 1842 01/12/21 0453 01/13/21 1208 01/14/21 0729 01/15/21 0727 01/16/21 0630 01/17/21 0617  NA 136 137 136 136 138 138 142  K 3.2* 2.8* 3.9 3.7 4.6 3.5 3.9  CL 100 101 100 100 104 102 104  CO2 25 30 25 27 25 26 30   GLUCOSE 114* 134* 102* 103* 144* 121* 92  BUN 13 14 10 11 14 15 14   CREATININE 0.62 0.66 0.60 0.48 0.56 0.52 0.67  CALCIUM 8.6* 8.2* 8.5* 8.4* 8.3* 8.0* 8.3*  MG 1.6* 2.3  --   --   --   --   --    GFR: Estimated Creatinine Clearance: 53.8 mL/min (by C-G formula based on SCr of 0.67 mg/dL). Liver Function Tests: No results for input(s): AST, ALT, ALKPHOS, BILITOT, PROT,  ALBUMIN in the last 168 hours. No results for input(s): LIPASE, AMYLASE in the last 168 hours. No results for input(s): AMMONIA in the last 168 hours. Coagulation Profile: Recent Labs  Lab 01/11/21 1958 01/13/21 0816 01/14/21 0729  INR 1.2 1.1 1.2   Cardiac Enzymes: No results for input(s): CKTOTAL, CKMB, CKMBINDEX, TROPONINI in the last 168 hours. BNP (last 3 results) No results for input(s): PROBNP in the last 8760 hours. HbA1C: No results for input(s): HGBA1C in the last 72 hours. CBG: No results for input(s): GLUCAP in the last 168 hours. Lipid Profile: No results for input(s): CHOL, HDL, LDLCALC, TRIG, CHOLHDL, LDLDIRECT in the last 72 hours. Thyroid Function Tests: No results for input(s): TSH, T4TOTAL, FREET4, T3FREE, THYROIDAB in the last 72 hours. Anemia Panel: No results for input(s): VITAMINB12, FOLATE, FERRITIN, TIBC, IRON, RETICCTPCT in the last 72 hours. Sepsis Labs: Recent  Labs  Lab 01/13/21 1208  PROCALCITON 0.36    Recent Results (from the past 240 hour(s))  Resp Panel by RT-PCR (Flu A&B, Covid) Nasopharyngeal Swab     Status: None   Collection Time: 01/11/21  8:35 PM   Specimen: Nasopharyngeal Swab; Nasopharyngeal(NP) swabs in vial transport medium  Result Value Ref Range Status   SARS Coronavirus 2 by RT PCR NEGATIVE NEGATIVE Final    Comment: (NOTE) SARS-CoV-2 target nucleic acids are NOT DETECTED.  The SARS-CoV-2 RNA is generally detectable in upper respiratory specimens during the acute phase of infection. The lowest concentration of SARS-CoV-2 viral copies this assay can detect is 138 copies/mL. A negative result does not preclude SARS-Cov-2 infection and should not be used as the sole basis for treatment or other patient management decisions. A negative result may occur with  improper specimen collection/handling, submission of specimen other than nasopharyngeal swab, presence of viral mutation(s) within the areas targeted by this assay, and inadequate number of viral copies(<138 copies/mL). A negative result must be combined with clinical observations, patient history, and epidemiological information. The expected result is Negative.  Fact Sheet for Patients:  EntrepreneurPulse.com.au  Fact Sheet for Healthcare Providers:  IncredibleEmployment.be  This test is no t yet approved or cleared by the [place] FDA and  has been authorized for detection and/or diagnosis of SARS-CoV-2 by FDA under an Emergency Use Authorization (EUA). This EUA will remain  in effect (meaning this test can be used) for the duration of the COVID-19 declaration under Section 564(b)(1) of the Act, 21 U.S.C.section 360bbb-3(b)(1), unless the authorization is terminated  or revoked sooner.       Influenza A by PCR NEGATIVE NEGATIVE Final   Influenza B by PCR NEGATIVE NEGATIVE Final    Comment: (NOTE) The Xpert Xpress  SARS-CoV-2/FLU/RSV plus assay is intended as an aid in the diagnosis of influenza from Nasopharyngeal swab specimens and should not be used as a sole basis for treatment. Nasal washings and aspirates are unacceptable for Xpert Xpress SARS-CoV-2/FLU/RSV testing.  Fact Sheet for Patients: EntrepreneurPulse.com.au  Fact Sheet for Healthcare Providers: IncredibleEmployment.be  This test is not yet approved or cleared by the [place] FDA and has been authorized for detection and/or diagnosis of SARS-CoV-2 by FDA under an Emergency Use Authorization (EUA). This EUA will remain in effect (meaning this test can be used) for the duration of the COVID-19 declaration under Section 564(b)(1) of the Act, 21 U.S.C. section 360bbb-3(b)(1), unless the authorization is terminated or revoked.  Performed at [facility], [address], [place], [place] [address]  Radiology Studies: No results found.      Scheduled Meds: . amLODipine  5 mg Oral Daily  . apixaban  5 mg Oral BID  . aspirin EC  81 mg Oral Daily  . atorvastatin  10 mg Oral Daily  . buPROPion  150 mg Oral Daily  . docusate sodium  100 mg Oral BID  . escitalopram  10 mg Oral Daily  . levothyroxine  88 mcg Oral QAC breakfast  . methocarbamol  750 mg Oral TID  . mometasone-formoterol  2 puff Inhalation BID  . montelukast  10 mg Oral Daily  . pregabalin  75 mg Oral BID  . tranexamic acid (CYKLOKAPRON) topical - INTRAOP  2,000 mg Topical 30 min Pre-Op  . traZODone  100 mg Oral QHS   Continuous Infusions: . sodium chloride 250 mL (01/14/21 0858)  . sodium chloride 10 mL/hr at 01/16/21 0415  . ampicillin-sulbactam (UNASYN) IV 3 g (01/17/21 0946)     LOS: 6 days    Time spent: 15 minutes    Sidney Ace, MD [facility] Pager [phone]  If 7PM-7AM, please contact night-coverage 01/17/2021, 11:11 AM

## 2021-01-17 NOTE — Progress Notes (Signed)
*  PRELIMINARY RESULTS* Echocardiogram 2D Echocardiogram has been performed.  [name] 01/17/2021, 8:43 AM

## 2021-01-17 NOTE — Consult Note (Signed)
Pharmacy Antibiotic Note  [name] is a 75 y.o. female with medical history including PAD, COPD, chronic respiratory failure, CAD admitted on 01/11/2021 with left femoral neck fracture. Pharmacy has been consulted for Unasyn dosing for aspiration pneumonia.  Plan: Day 4- Unasyn 3 g IV q6h  Height: 5\' 4"  (162.6 cm) Weight: 54.4 kg (120 lb) IBW/kg (Calculated) : 54.7  Temp (24hrs), Avg:98 F (36.7 C), Min:97.6 F (36.4 C), Max:99 F (37.2 C)  Recent Labs  Lab 01/13/21 1208 01/14/21 0729 01/15/21 0727 01/16/21 0630 01/17/21 0617  WBC 14.0* 11.8* 9.3 8.2 6.9  CREATININE 0.60 0.48 0.56 0.52 0.67    Estimated Creatinine Clearance: 53.8 mL/min (by C-G formula based on SCr of 0.67 mg/dL).    No Known Allergies  Antimicrobials this admission: Unasyn 2/24 (evening) >>   Dose adjustments this admission: N/A  Microbiology results: 2/22 SARS-CoV-2 / Influenza A & B: (-)  Thank you for allowing pharmacy to be a part of this patient's care.  [name] A 01/17/2021 1:46 PM

## 2021-01-17 NOTE — Progress Notes (Signed)
Physical Therapy Treatment Patient Details Name: [name] MRN: [id number] DOB: [birth year] Today's Date: 01/17/2021    History of Present Illness Pt is a 75 y/o F who presents to ED from home for evaluation of L hip pain after a fall with minor head trauma but no LOC. Pt found to have L femur fx & underwent L IM nail placement by Dr. Harlow Mares on 01/14/21.    PT Comments    Pt remained OOB for 1 1/2 hours today before requesting to get back to bed.  She stands with relative ease and transfers back to bed with min a x 1.  Min a to get Le's back in bed.  She did well tolerating sitting today but still seems to self limit herself and gait has not been progressed.     Follow Up Recommendations  SNF;Supervision/Assistance - 24 hour     Equipment Recommendations  None recommended by PT    Recommendations for Other Services       Precautions / Restrictions Precautions Precautions: Fall Restrictions Weight Bearing Restrictions: Yes RLE Weight Bearing: Partial weight bearing RLE Partial Weight Bearing Percentage or Pounds: post op shoe LLE Weight Bearing: Weight bearing as tolerated    Mobility  Bed Mobility Overal bed mobility: Needs Assistance Bed Mobility: Sit to Supine     Supine to sit: Min assist Sit to supine: Min assist        Transfers Overall transfer level: Needs assistance Equipment used: Rolling walker (2 wheeled) Transfers: Sit to/from Stand Sit to Stand: Min assist            Ambulation/Gait Ambulation/Gait assistance: Herbalist (Feet): 3 Feet Assistive device: Rolling walker (2 wheeled) Gait Pattern/deviations: Step-to pattern Gait velocity: decreased   General Gait Details: transfers back to bed per her request   Stairs             Wheelchair Mobility    Modified Rankin (Stroke Patients Only)       Balance Overall balance assessment: Needs assistance Sitting-balance support: Bilateral upper extremity supported;Feet  supported Sitting balance-Leahy Scale: Good Sitting balance - Comments: supervision sitting EOB   Standing balance support: During functional activity;Bilateral upper extremity supported Standing balance-Leahy Scale: Poor Standing balance comment: better today with less post lean                            Cognition Arousal/Alertness: Awake/alert Behavior During Therapy: WFL for tasks assessed/performed Overall Cognitive Status: Within Functional Limits for tasks assessed                                        Exercises      General Comments General comments (skin integrity, edema, etc.): HR 130 today with transfer, but generally more stable with faster recovery      Pertinent Vitals/Pain Pain Assessment: Faces Faces Pain Scale: Hurts even more Pain Location: LLE Pain Descriptors / Indicators: Guarding;Grimacing Pain Intervention(s): Limited activity within patient's tolerance;Monitored during session;Premedicated before session;Repositioned    Home Living                      Prior Function            PT Goals (current goals can now be found in the care plan section) Progress towards PT goals: Progressing toward goals  Frequency    BID      PT Plan Current plan remains appropriate    Co-evaluation              AM-PAC PT "6 Clicks" Mobility   Outcome Measure  Help needed turning from your back to your side while in a flat bed without using bedrails?: A Little Help needed moving from lying on your back to sitting on the side of a flat bed without using bedrails?: A Little Help needed moving to and from a bed to a chair (including a wheelchair)?: A Little Help needed standing up from a chair using your arms (e.g., wheelchair or bedside chair)?: A Little Help needed to walk in hospital room?: A Little Help needed climbing 3-5 steps with a railing? : Total 6 Click Score: 16    End of Session Equipment Utilized  During Treatment: Oxygen;Gait belt Activity Tolerance: Patient tolerated treatment well;Patient limited by fatigue Patient left: in bed;with bed alarm set;with call bell/phone within reach   PT Visit Diagnosis: Difficulty in walking, not elsewhere classified (R26.2);Muscle weakness (generalized) (M62.81);Pain Pain - Right/Left: Left     Time: 1101-1109 PT Time Calculation (min) (ACUTE ONLY): 8 min  Charges:  $Therapeutic Activity: 8-22 mins                    [name], PTA 01/17/21, 12:28 PM

## 2021-01-17 NOTE — Progress Notes (Signed)
Physical Therapy Treatment Patient Details Name: [name] MRN: [id number] DOB: 03-29-1946 Today's Date: 01/17/2021    History of Present Illness Pt is a 75 y/o F who presents to ED from home for evaluation of L hip pain after a fall with minor head trauma but no LOC. Pt found to have L femur fx & underwent L IM nail placement by Dr. Harlow Mares on 01/14/21.    PT Comments    Pt ready to get up this am. Premedicated before session.  To EOB with min guard and good effort today.  Steady in sitting.  Stood with min a x 1 and transferred to recliner at bedside.   Follow Up Recommendations  SNF;Supervision/Assistance - 24 hour     Equipment Recommendations  None recommended by PT    Recommendations for Other Services       Precautions / Restrictions Precautions Precautions: Fall Restrictions Weight Bearing Restrictions: Yes RLE Weight Bearing: Partial weight bearing RLE Partial Weight Bearing Percentage or Pounds: post op shoe LLE Weight Bearing: Weight bearing as tolerated    Mobility  Bed Mobility Overal bed mobility: Needs Assistance Bed Mobility: Supine to Sit     Supine to sit: Min assist          Transfers Overall transfer level: Needs assistance Equipment used: Rolling walker (2 wheeled) Transfers: Sit to/from Stand Sit to Stand: Min assist            Ambulation/Gait Ambulation/Gait assistance: Herbalist (Feet): 3 Feet Assistive device: Rolling walker (2 wheeled) Gait Pattern/deviations: Step-to pattern Gait velocity: decreased   General Gait Details: transfers to chair at bedside   Stairs             Wheelchair Mobility    Modified Rankin (Stroke Patients Only)       Balance Overall balance assessment: Needs assistance Sitting-balance support: Bilateral upper extremity supported;Feet supported Sitting balance-Leahy Scale: Good Sitting balance - Comments: supervision sitting EOB   Standing balance support: During  functional activity;Bilateral upper extremity supported Standing balance-Leahy Scale: Poor Standing balance comment: better today with less post lean                            Cognition Arousal/Alertness: Awake/alert Behavior During Therapy: WFL for tasks assessed/performed Overall Cognitive Status: Within Functional Limits for tasks assessed                                        Exercises      General Comments General comments (skin integrity, edema, etc.): HR 130 today with transfer, but generally more stable with faster recovery      Pertinent Vitals/Pain Pain Assessment: Faces Faces Pain Scale: Hurts little more Pain Location: LLE Pain Descriptors / Indicators: Guarding;Grimacing Pain Intervention(s): Limited activity within patient's tolerance;Monitored during session;Premedicated before session;Repositioned    Home Living                      Prior Function            PT Goals (current goals can now be found in the care plan section) Progress towards PT goals: Progressing toward goals    Frequency    BID      PT Plan Current plan remains appropriate    Co-evaluation  AM-PAC PT "6 Clicks" Mobility   Outcome Measure  Help needed turning from your back to your side while in a flat bed without using bedrails?: A Little Help needed moving from lying on your back to sitting on the side of a flat bed without using bedrails?: A Little Help needed moving to and from a bed to a chair (including a wheelchair)?: A Little Help needed standing up from a chair using your arms (e.g., wheelchair or bedside chair)?: A Little Help needed to walk in hospital room?: A Little Help needed climbing 3-5 steps with a railing? : Total 6 Click Score: 16    End of Session Equipment Utilized During Treatment: Oxygen;Gait belt Activity Tolerance: Patient tolerated treatment well;Patient limited by fatigue Patient left: in  bed;with bed alarm set;with call bell/phone within reach   PT Visit Diagnosis: Difficulty in walking, not elsewhere classified (R26.2);Muscle weakness (generalized) (M62.81);Pain Pain - Right/Left: Left     Time: 8338-2505 PT Time Calculation (min) (ACUTE ONLY): 12 min  Charges:  $Therapeutic Activity: 8-22 mins                    [name], PTA 01/17/21, 12:23 PM

## 2021-01-17 NOTE — Progress Notes (Signed)
Subjective:  Patient reports pain as moderate.  Sitting up in bed.  Objective:   VITALS:   Vitals:   01/17/21 0037 01/17/21 0354 01/17/21 0756 01/17/21 1151  BP: 124/84 107/71 122/71 113/63  Pulse: (!) 102 (!) 104 96 (!) 105  Resp: 18 18 16 19   Temp: 97.8 F (36.6 C) 97.8 F (36.6 C) 97.8 F (36.6 C) 97.6 F (36.4 C)  TempSrc:      SpO2: 100% 99% 100% 100%  Weight:      Height:        PHYSICAL EXAM:  ABD soft Sensation intact distally Dorsiflexion/Plantar flexion intact Incision: scant drainage No cellulitis present Compartment soft  LABS  Results for orders placed or performed during the hospital encounter of 01/11/21 (from the past 24 hour(s))  CBC with Differential/Platelet     Status: Abnormal   Collection Time: 01/17/21  6:17 AM  Result Value Ref Range   WBC 6.9 4.0 - 10.5 K/uL   RBC 2.94 (L) 3.87 - 5.11 MIL/uL   Hemoglobin 8.4 (L) 12.0 - 15.0 g/dL   HCT 27.1 (L) 36.0 - 46.0 %   MCV 92.2 80.0 - 100.0 fL   MCH 28.6 26.0 - 34.0 pg   MCHC 31.0 30.0 - 36.0 g/dL   RDW 14.9 11.5 - 15.5 %   Platelets 384 150 - 400 K/uL   nRBC 0.0 0.0 - 0.2 %   Neutrophils Relative % 57 %   Neutro Abs 4.0 1.7 - 7.7 K/uL   Lymphocytes Relative 30 %   Lymphs Abs 2.1 0.7 - 4.0 K/uL   Monocytes Relative 9 %   Monocytes Absolute 0.6 0.1 - 1.0 K/uL   Eosinophils Relative 3 %   Eosinophils Absolute 0.2 0.0 - 0.5 K/uL   Basophils Relative 0 %   Basophils Absolute 0.0 0.0 - 0.1 K/uL   Immature Granulocytes 1 %   Abs Immature Granulocytes 0.05 0.00 - 0.07 K/uL  Basic metabolic panel     Status: Abnormal   Collection Time: 01/17/21  6:17 AM  Result Value Ref Range   Sodium 142 135 - 145 mmol/L   Potassium 3.9 3.5 - 5.1 mmol/L   Chloride 104 98 - 111 mmol/L   CO2 30 22 - 32 mmol/L   Glucose, Bld 92 70 - 99 mg/dL   BUN 14 8 - 23 mg/dL   Creatinine, Ser 0.67 0.44 - 1.00 mg/dL   Calcium 8.3 (L) 8.9 - 10.3 mg/dL   GFR, Estimated >60 >60 mL/min   Anion gap 8 5 - 15     ECHOCARDIOGRAM COMPLETE  Result Date: 01/17/2021    ECHOCARDIOGRAM REPORT   Patient Name:   [name] Date of Exam: 01/17/2021 Medical Rec #:  [id number]   Height:       64.0 in Accession #:    [id number]  Weight:       120.0 lb Date of Birth:  Jul 02, 1946   BSA:          1.575 m Patient Age:    75 years    BP:           122/71 mmHg Patient Gender: F           HR:           96 bpm. Exam Location:  [facility] Procedure: 2D Echo, Cardiac Doppler, Color Doppler and Strain Analysis Indications:     Atrial Fibrillation I48.91  History:         Patient has  no prior history of Echocardiogram examinations.                  CAD, COPD; Risk Factors:Hypertension.  Sonographer:     Sherrie Sport RDCS (AE) Referring Phys:  [id number] Sidney Ace Diagnosing Phys: Kathlyn Sacramento MD  Sonographer Comments: Global longitudinal strain was attempted. IMPRESSIONS  1. Left ventricular ejection fraction, by estimation, is 60 to 65%. The left ventricle has normal function. The left ventricle has no regional wall motion abnormalities. Left ventricular diastolic parameters are indeterminate. The average left ventricular global longitudinal strain is -8.2 %. The global longitudinal strain is abnormal.  2. Right ventricular systolic function is normal. The right ventricular size is normal. Tricuspid regurgitation signal is inadequate for assessing PA pressure.  3. The mitral valve is normal in structure. No evidence of mitral valve regurgitation. No evidence of mitral stenosis.  4. The aortic valve is normal in structure. Aortic valve regurgitation is not visualized. No aortic stenosis is present.  5. The inferior vena cava is normal in size with <50% respiratory variability, suggesting right atrial pressure of 8 mmHg. FINDINGS  Left Ventricle: Left ventricular ejection fraction, by estimation, is 60 to 65%. The left ventricle has normal function. The left ventricle has no regional wall motion abnormalities. The average left ventricular  global longitudinal strain is -8.2 %. The  global longitudinal strain is abnormal. The left ventricular internal cavity size was normal in size. There is no left ventricular hypertrophy. Left ventricular diastolic parameters are indeterminate. Right Ventricle: The right ventricular size is normal. No increase in right ventricular wall thickness. Right ventricular systolic function is normal. Tricuspid regurgitation signal is inadequate for assessing PA pressure. Left Atrium: Left atrial size was normal in size. Right Atrium: Right atrial size was normal in size. Pericardium: There is no evidence of pericardial effusion. Mitral Valve: The mitral valve is normal in structure. No evidence of mitral valve regurgitation. No evidence of mitral valve stenosis. Tricuspid Valve: The tricuspid valve is normal in structure. Tricuspid valve regurgitation is not demonstrated. No evidence of tricuspid stenosis. Aortic Valve: The aortic valve is normal in structure. Aortic valve regurgitation is not visualized. No aortic stenosis is present. Aortic valve mean gradient measures 3.0 mmHg. Aortic valve peak gradient measures 7.3 mmHg. Aortic valve area, by VTI measures 3.32 cm. Pulmonic Valve: The pulmonic valve was normal in structure. Pulmonic valve regurgitation is not visualized. No evidence of pulmonic stenosis. Aorta: The aortic root is normal in size and structure. Venous: The inferior vena cava is normal in size with less than 50% respiratory variability, suggesting right atrial pressure of 8 mmHg. IAS/Shunts: No atrial level shunt detected by color flow Doppler.  LEFT VENTRICLE PLAX 2D LVIDd:         3.88 cm  Diastology LVIDs:         2.23 cm  LV e' medial:    7.40 cm/s LV PW:         0.77 cm  LV E/e' medial:  8.5 LV IVS:        0.79 cm  LV e' lateral:   7.83 cm/s LVOT diam:     2.00 cm  LV E/e' lateral: 8.0 LV SV:         59 LV SV Index:   38       2D Longitudinal Strain LVOT Area:     3.14 cm 2D Strain GLS Avg:     -8.2  %  3D Volume EF:                         3D EF:        61 %                         LV EDV:       101 ml                         LV ESV:       39 ml                         LV SV:        61 ml RIGHT VENTRICLE RV S prime:     12.50 cm/s TAPSE (M-mode): 3.8 cm LEFT ATRIUM           Index       RIGHT ATRIUM           Index LA diam:      3.60 cm 2.29 cm/m  RA Area:     14.60 cm LA Vol (A4C): 29.7 ml 18.86 ml/m RA Volume:   34.00 ml  21.59 ml/m  AORTIC VALVE                   PULMONIC VALVE AV Area (Vmax):    2.31 cm    PV Vmax:        0.54 m/s AV Area (Vmean):   2.81 cm    PV Peak grad:   1.2 mmHg AV Area (VTI):     3.32 cm    RVOT Peak grad: 3 mmHg AV Vmax:           135.00 cm/s AV Vmean:          75.100 cm/s AV VTI:            0.179 m AV Peak Grad:      7.3 mmHg AV Mean Grad:      3.0 mmHg LVOT Vmax:         99.20 cm/s LVOT Vmean:        67.100 cm/s LVOT VTI:          0.189 m LVOT/AV VTI ratio: 1.06  AORTA Ao Root diam: 3.10 cm MITRAL VALVE                TRICUSPID VALVE MV Area (PHT): 5.93 cm     TR Peak grad:   10.1 mmHg MV Decel Time: 128 msec     TR Vmax:        159.00 cm/s MV E velocity: 62.60 cm/s MV A velocity: 109.00 cm/s  SHUNTS MV E/A ratio:  0.57         Systemic VTI:  0.19 m                             Systemic Diam: 2.00 cm Kathlyn Sacramento MD Electronically signed by Kathlyn Sacramento MD Signature Date/Time: 01/17/2021/11:27:49 AM    Final     Assessment/Plan: 3 Days Post-Op   Principal Problem:   Fracture of femoral neck, left, closed ([place]) Active Problems:   HLD (hyperlipidemia)   Hypothyroidism   Anxiety and depression   Hypomagnesemia   Chronic obstructive pulmonary disease (COPD) (HCC)   PAD (peripheral artery disease) (HCC)  Chronic respiratory failure with hypoxia (HCC)   Hypokalemia   Up with therapy Discharge to SNF   Lovell Sheehan , MD 01/17/2021, 3:11 PM

## 2021-01-18 LAB — CBC WITH DIFFERENTIAL/PLATELET
Abs Immature Granulocytes: 0.08 10*3/uL — ABNORMAL HIGH (ref 0.00–0.07)
Basophils Absolute: 0 10*3/uL (ref 0.0–0.1)
Basophils Relative: 1 %
Eosinophils Absolute: 0.2 10*3/uL (ref 0.0–0.5)
Eosinophils Relative: 3 %
HCT: 25 % — ABNORMAL LOW (ref 36.0–46.0)
Hemoglobin: 7.5 g/dL — ABNORMAL LOW (ref 12.0–15.0)
Immature Granulocytes: 1 %
Lymphocytes Relative: 26 %
Lymphs Abs: 1.9 10*3/uL (ref 0.7–4.0)
MCH: 27.7 pg (ref 26.0–34.0)
MCHC: 30 g/dL (ref 30.0–36.0)
MCV: 92.3 fL (ref 80.0–100.0)
Monocytes Absolute: 0.5 10*3/uL (ref 0.1–1.0)
Monocytes Relative: 7 %
Neutro Abs: 4.6 10*3/uL (ref 1.7–7.7)
Neutrophils Relative %: 62 %
Platelets: 394 10*3/uL (ref 150–400)
RBC: 2.71 MIL/uL — ABNORMAL LOW (ref 3.87–5.11)
RDW: 14.8 % (ref 11.5–15.5)
WBC: 7.3 10*3/uL (ref 4.0–10.5)
nRBC: 0 % (ref 0.0–0.2)

## 2021-01-18 LAB — BASIC METABOLIC PANEL
Anion gap: 5 (ref 5–15)
BUN: 14 mg/dL (ref 8–23)
CO2: 30 mmol/L (ref 22–32)
Calcium: 8.3 mg/dL — ABNORMAL LOW (ref 8.9–10.3)
Chloride: 107 mmol/L (ref 98–111)
Creatinine, Ser: 0.57 mg/dL (ref 0.44–1.00)
GFR, Estimated: >60 mL/min (ref >60)
Glucose, Bld: 98 mg/dL (ref 70–99)
Potassium: 3.8 mmol/L (ref 3.5–5.1)
Sodium: 142 mmol/L (ref 135–145)

## 2021-01-18 MED ORDER — POLYETHYLENE GLYCOL 3350 17 G PO PACK
17.0000 g | PACK | Freq: Every day | ORAL | Status: DC
  Administered 2021-01-18 – 2021-01-20 (×3): 17 g via ORAL
  Filled 2021-01-18 (×3): qty 1

## 2021-01-18 MED ORDER — BISACODYL 10 MG RE SUPP: 10.0000 mg | Freq: Every day | RECTAL | Status: DC | PRN

## 2021-01-18 MED ORDER — MORPHINE SULFATE (PF) 2 MG/ML IV SOLN
2.0000 mg | Freq: Four times a day (QID) | INTRAVENOUS | Status: DC | PRN
  Administered 2021-01-18 – 2021-01-20 (×6): 2 mg via INTRAVENOUS
  Filled 2021-01-18 (×6): qty 1

## 2021-01-18 MED ORDER — SENNA 8.6 MG PO TABS
1.0000 | ORAL_TABLET | Freq: Two times a day (BID) | ORAL | Status: DC
  Administered 2021-01-18 – 2021-01-20 (×5): 8.6 mg via ORAL
  Filled 2021-01-18 (×5): qty 1

## 2021-01-18 NOTE — Progress Notes (Signed)
PROGRESS NOTE    [name]  XHB:[id number] DOB: June 22, 1946 DOA: 01/11/2021 PCP: Albina Billet, MD   Brief Narrative:  75 y.o. female with medical history significant for PAD s/p mechanical thrombectomy and stent angioplasty of the right lower extremity and third right toe amputation, COPD, chronic respiratory failure with hypoxia on 2 L supplemental O2 via [place] at all times, CAD, hyperlipidemia, depression/anxiety, hard of hearing who presents to the ED for evaluation of left hip pain after a fall.  Patient was recently hospitalized from 12/22/2020-12/31/2020 for gangrene of the third toe on the right foot.  She underwent angiogram of RLE on 12/23/2020 with mechanical thrombectomy and catheter directed thrombolytic therapy to the right SFA, popliteal artery, and anterior tibial artery.  S/p amputation of the third toe on the right foot 12/26/2020.  She has been on aspirin and Eliquis.  She was discharged to home with home health after SNF refusal.  Had a mechanical fall at home attributed to slick surgical bandages.  Minor head trauma but did not lose consciousness.  No other significant injuries.  Imaging on admission demonstrated a mildly impacted fracture of the left femoral neck  Orthopedics on consult.  Plan for operative fixation Friday, 01/14/2021.  Patient had acute onset of sinus tachycardia on 2/24.  Given concern for PE in the setting of held anticoagulation pursued CTA thorax.  No PE identified however there was evidence of a right basilar pneumonia, suspicious for aspiration.  2/28: Pain control improving.  Mobility with physical therapy slowly improving. 3/1: Patient overall stable.  Continues to endorse constant pain.  Had a long conversation with her and discussed the need to discontinue IV narcotics.  We will slowly start to taper in preparation for discharge.  At this point patient is medically stable.  We are pending placement to skilled nursing facility.  Assessment & Plan:    Principal Problem:   Fracture of femoral neck, left, closed (HCC) Active Problems:   HLD (hyperlipidemia)   Hypothyroidism   Anxiety and depression   Hypomagnesemia   Chronic obstructive pulmonary disease (COPD) (HCC)   PAD (peripheral artery disease) (HCC)   Chronic respiratory failure with hypoxia (HCC)   Hypokalemia  Right base pneumonia, likely aspiration Patient had acute onset of tachycardia on 2/24 CT thorax revealed above Patient hemodynamically stable, afebrile Started on Unasyn Tachycardia improved Plan: Completed course of Unasyn No further antibiotics Aspiration precautions  Sinus tachycardia Patient has episodes of elevated heart rate of unclear etiology No history of atrial fibrillation Episodes resolved with administration of IV metoprolol Patient remains relatively asymptomatic Plan: Can discontinue telemetry Echocardiogram reassuring  Left femoral neck fracture Status post chemical fall Patient unsure but likely last dose of Eliquis was 2/21 Orthopedics on consult Status post IM nail, 2/25 Postoperative pain control has been challenging Reportedly making progress Plan: Continue multimodal pain control Minimize use of IV narcotics.  Morphine tapered to 2 mg every 6 hours as needed.  Can further taper/discontinue in 24 hours Continue aspirin Eliquis Discussed with patient.  She is in agreement for skilled nursing facility.   TOC consulted Patient is medically stable for discharge at this time Bed search initiated  COPD/chronic respiratory failure with hypoxia: Chronic and stable on home 2 L of home O2 via [place].   Continue Advair, Singulair, albuterol as needed.  Hypokalemia/hypomagnesemia: Supplement with IV magnesium followed by oral potassium.  PAD: S/p recent admission with angiogram of RLE on 12/23/2020 with mechanical thrombectomy and catheter directed thrombolytic therapy to  the right SFA, popliteal artery, and anterior tibial artery.  S/p  amputation of the third toe on the right foot 12/26/2020. -Resumed Eliquis and aspirin -Continue atorvastatin  Hypertension: Continue amlodipine.  Hypothyroidism: Continue Synthroid.  Hyperlipidemia: Atorvastatin.  Anxiety/depression: Continue Wellbutrin and Lexapro.    DVT prophylaxis: Eliquis Code Status: Full Family Communication: Raquel Sarna, daughter-in-law at [phone] on 3/1 Disposition Plan: Status is: Inpatient  Remains inpatient appropriate because:Unsafe d/c plan   Dispo: The patient is from: Home              Anticipated d/c is to: SNF              Patient currently is medically stable to d/c.   Difficult to place patient No   Patient is medically stable for discharge to skilled nursing facility at this time.  Bed search initiated.               Level of care: Med-Surg  Consultants:   Orthopedics  Procedures:   None  Antimicrobials:   None   Subjective: Patient seen and examined.  Pain control improving slowly  Objective: Vitals:   01/17/21 1942 01/18/21 0041 01/18/21 0343 01/18/21 0900  BP: 120/71 114/63 140/74 126/68  Pulse: (!) 110 99 (!) 110 (!) 107  Resp:  17 20 20   Temp: 98.6 F (37 C) 97.6 F (36.4 C) 99 F (37.2 C) (!) 97.2 F (36.2 C)  TempSrc: Oral  Oral   SpO2: 98% 97% 96% 93%  Weight:      Height:        Intake/Output Summary (Last 24 hours) at 01/18/2021 1404 Last data filed at 01/18/2021 1359 Gross per 24 hour  Intake 960 ml  Output 1400 ml  Net -440 ml   Filed Weights   01/11/21 1842  Weight: 54.4 kg    Examination:  General exam: Appears calm and comfortable  Respiratory system: Clear to auscultation. Respiratory effort normal. Cardiovascular system: S1 & S2 heard, RRR. No JVD, murmurs, rubs, gallops or clicks. No pedal edema. Gastrointestinal system: Abdomen is nondistended, soft and nontender. No organomegaly or masses felt. Normal bowel sounds heard. Central nervous system: Alert and  oriented. No focal neurological deficits. Extremities: Decreased power LLE, left leg shortened and externally rotated Skin: R distal second toe with dry necrotic appearance.  S/p right third toe amputation Psychiatry: Judgement and insight appear normal. Mood & affect appropriate.     Data Reviewed: I have personally reviewed following labs and imaging studies  CBC: Recent Labs  Lab 01/14/21 0729 01/15/21 0727 01/16/21 0630 01/17/21 0617 01/18/21 0454  WBC 11.8* 9.3 8.2 6.9 7.3  NEUTROABS 9.8* 7.9* 6.1 4.0 4.6  HGB 8.8* 8.3* 7.7* 8.4* 7.5*  HCT 28.6* 25.9* 24.4* 27.1* 25.0*  MCV 91.7 90.6 90.0 92.2 92.3  PLT 324 264 373 384 629   Basic Metabolic Panel: Recent Labs  Lab 01/11/21 1842 01/12/21 0453 01/13/21 1208 01/14/21 0729 01/15/21 0727 01/16/21 0630 01/17/21 0617 01/18/21 0454  NA 136 137   < > 136 138 138 142 142  K 3.2* 2.8*   < > 3.7 4.6 3.5 3.9 3.8  CL 100 101   < > 100 104 102 104 107  CO2 25 30   < > 27 25 26 30 30   GLUCOSE 114* 134*   < > 103* 144* 121* 92 98  BUN 13 14   < > 11 14 15 14 14   CREATININE 0.62 0.66   < > 0.48 0.56  0.52 0.67 0.57  CALCIUM 8.6* 8.2*   < > 8.4* 8.3* 8.0* 8.3* 8.3*  MG 1.6* 2.3  --   --   --   --   --   --    < > = values in this interval not displayed.   GFR: Estimated Creatinine Clearance: 53.8 mL/min (by C-G formula based on SCr of 0.57 mg/dL). Liver Function Tests: No results for input(s): AST, ALT, ALKPHOS, BILITOT, PROT, ALBUMIN in the last 168 hours. No results for input(s): LIPASE, AMYLASE in the last 168 hours. No results for input(s): AMMONIA in the last 168 hours. Coagulation Profile: Recent Labs  Lab 01/11/21 1958 01/13/21 0816 01/14/21 0729  INR 1.2 1.1 1.2   Cardiac Enzymes: No results for input(s): CKTOTAL, CKMB, CKMBINDEX, TROPONINI in the last 168 hours. BNP (last 3 results) No results for input(s): PROBNP in the last 8760 hours. HbA1C: No results for input(s): HGBA1C in the last 72 hours. CBG: No  results for input(s): GLUCAP in the last 168 hours. Lipid Profile: No results for input(s): CHOL, HDL, LDLCALC, TRIG, CHOLHDL, LDLDIRECT in the last 72 hours. Thyroid Function Tests: No results for input(s): TSH, T4TOTAL, FREET4, T3FREE, THYROIDAB in the last 72 hours. Anemia Panel: No results for input(s): VITAMINB12, FOLATE, FERRITIN, TIBC, IRON, RETICCTPCT in the last 72 hours. Sepsis Labs: Recent Labs  Lab 01/13/21 1208  PROCALCITON 0.36    Recent Results (from the past 240 hour(s))  Resp Panel by RT-PCR (Flu A&B, Covid) Nasopharyngeal Swab     Status: None   Collection Time: 01/11/21  8:35 PM   Specimen: Nasopharyngeal Swab; Nasopharyngeal(NP) swabs in vial transport medium  Result Value Ref Range Status   SARS Coronavirus 2 by RT PCR NEGATIVE NEGATIVE Final    Comment: (NOTE) SARS-CoV-2 target nucleic acids are NOT DETECTED.  The SARS-CoV-2 RNA is generally detectable in upper respiratory specimens during the acute phase of infection. The lowest concentration of SARS-CoV-2 viral copies this assay can detect is 138 copies/mL. A negative result does not preclude SARS-Cov-2 infection and should not be used as the sole basis for treatment or other patient management decisions. A negative result may occur with  improper specimen collection/handling, submission of specimen other than nasopharyngeal swab, presence of viral mutation(s) within the areas targeted by this assay, and inadequate number of viral copies(<138 copies/mL). A negative result must be combined with clinical observations, patient history, and epidemiological information. The expected result is Negative.  Fact Sheet for Patients:  EntrepreneurPulse.com.au  Fact Sheet for Healthcare Providers:  IncredibleEmployment.be  This test is no t yet approved or cleared by the [place] FDA and  has been authorized for detection and/or diagnosis of SARS-CoV-2 by FDA under an  Emergency Use Authorization (EUA). This EUA will remain  in effect (meaning this test can be used) for the duration of the COVID-19 declaration under Section 564(b)(1) of the Act, 21 U.S.C.section 360bbb-3(b)(1), unless the authorization is terminated  or revoked sooner.       Influenza A by PCR NEGATIVE NEGATIVE Final   Influenza B by PCR NEGATIVE NEGATIVE Final    Comment: (NOTE) The Xpert Xpress SARS-CoV-2/FLU/RSV plus assay is intended as an aid in the diagnosis of influenza from Nasopharyngeal swab specimens and should not be used as a sole basis for treatment. Nasal washings and aspirates are unacceptable for Xpert Xpress SARS-CoV-2/FLU/RSV testing.  Fact Sheet for Patients: EntrepreneurPulse.com.au  Fact Sheet for Healthcare Providers: IncredibleEmployment.be  This test is not yet approved or cleared  by the [place] and has been authorized for detection and/or diagnosis of SARS-CoV-2 by FDA under an Emergency Use Authorization (EUA). This EUA will remain in effect (meaning this test can be used) for the duration of the COVID-19 declaration under Section 564(b)(1) of the Act, 21 U.S.C. section 360bbb-3(b)(1), unless the authorization is terminated or revoked.  Performed at [facility], [address], [place], [place] [address]          Radiology Studies: ECHOCARDIOGRAM COMPLETE  Result Date: 01/17/2021    ECHOCARDIOGRAM REPORT   Patient Name:   ANN T. Berkey Date of Exam: 01/17/2021 Medical Rec #:  [id number]   Height:       64.0 in Accession #:    [id number]  Weight:       120.0 lb Date of Birth:  [birth year]   BSA:          1.575 m Patient Age:    43 years    BP:           122/71 mmHg Patient Gender: F           HR:           96 bpm. Exam Location:  [facility] Procedure: 2D Echo, Cardiac Doppler, Color Doppler and Strain Analysis Indications:     Atrial Fibrillation I48.91  History:         Patient has no prior history of  Echocardiogram examinations.                  CAD, COPD; Risk Factors:Hypertension.  Sonographer:     Sherrie Sport RDCS (AE) Referring Phys:  [id number] Sidney Ace Diagnosing Phys: Kathlyn Sacramento MD  Sonographer Comments: Global longitudinal strain was attempted. IMPRESSIONS  1. Left ventricular ejection fraction, by estimation, is 60 to 65%. The left ventricle has normal function. The left ventricle has no regional wall motion abnormalities. Left ventricular diastolic parameters are indeterminate. The average left ventricular global longitudinal strain is -8.2 %. The global longitudinal strain is abnormal.  2. Right ventricular systolic function is normal. The right ventricular size is normal. Tricuspid regurgitation signal is inadequate for assessing PA pressure.  3. The mitral valve is normal in structure. No evidence of mitral valve regurgitation. No evidence of mitral stenosis.  4. The aortic valve is normal in structure. Aortic valve regurgitation is not visualized. No aortic stenosis is present.  5. The inferior vena cava is normal in size with <50% respiratory variability, suggesting right atrial pressure of 8 mmHg. FINDINGS  Left Ventricle: Left ventricular ejection fraction, by estimation, is 60 to 65%. The left ventricle has normal function. The left ventricle has no regional wall motion abnormalities. The average left ventricular global longitudinal strain is -8.2 %. The  global longitudinal strain is abnormal. The left ventricular internal cavity size was normal in size. There is no left ventricular hypertrophy. Left ventricular diastolic parameters are indeterminate. Right Ventricle: The right ventricular size is normal. No increase in right ventricular wall thickness. Right ventricular systolic function is normal. Tricuspid regurgitation signal is inadequate for assessing PA pressure. Left Atrium: Left atrial size was normal in size. Right Atrium: Right atrial size was normal in size. Pericardium:  There is no evidence of pericardial effusion. Mitral Valve: The mitral valve is normal in structure. No evidence of mitral valve regurgitation. No evidence of mitral valve stenosis. Tricuspid Valve: The tricuspid valve is normal in structure. Tricuspid valve regurgitation is not demonstrated. No evidence of tricuspid stenosis. Aortic  Valve: The aortic valve is normal in structure. Aortic valve regurgitation is not visualized. No aortic stenosis is present. Aortic valve mean gradient measures 3.0 mmHg. Aortic valve peak gradient measures 7.3 mmHg. Aortic valve area, by VTI measures 3.32 cm. Pulmonic Valve: The pulmonic valve was normal in structure. Pulmonic valve regurgitation is not visualized. No evidence of pulmonic stenosis. Aorta: The aortic root is normal in size and structure. Venous: The inferior vena cava is normal in size with less than 50% respiratory variability, suggesting right atrial pressure of 8 mmHg. IAS/Shunts: No atrial level shunt detected by color flow Doppler.  LEFT VENTRICLE PLAX 2D LVIDd:         3.88 cm  Diastology LVIDs:         2.23 cm  LV e' medial:    7.40 cm/s LV PW:         0.77 cm  LV E/e' medial:  8.5 LV IVS:        0.79 cm  LV e' lateral:   7.83 cm/s LVOT diam:     2.00 cm  LV E/e' lateral: 8.0 LV SV:         59 LV SV Index:   38       2D Longitudinal Strain LVOT Area:     3.14 cm 2D Strain GLS Avg:     -8.2 %                          3D Volume EF:                         3D EF:        61 %                         LV EDV:       101 ml                         LV ESV:       39 ml                         LV SV:        61 ml RIGHT VENTRICLE RV S prime:     12.50 cm/s TAPSE (M-mode): 3.8 cm LEFT ATRIUM           Index       RIGHT ATRIUM           Index LA diam:      3.60 cm 2.29 cm/m  RA Area:     14.60 cm LA Vol (A4C): 29.7 ml 18.86 ml/m RA Volume:   34.00 ml  21.59 ml/m  AORTIC VALVE                   PULMONIC VALVE AV Area (Vmax):    2.31 cm    PV Vmax:        0.54 m/s AV Area  (Vmean):   2.81 cm    PV Peak grad:   1.2 mmHg AV Area (VTI):     3.32 cm    RVOT Peak grad: 3 mmHg AV Vmax:           135.00 cm/s AV Vmean:          75.100 cm/s AV VTI:  0.179 m AV Peak Grad:      7.3 mmHg AV Mean Grad:      3.0 mmHg LVOT Vmax:         99.20 cm/s LVOT Vmean:        67.100 cm/s LVOT VTI:          0.189 m LVOT/AV VTI ratio: 1.06  AORTA Ao Root diam: 3.10 cm MITRAL VALVE                TRICUSPID VALVE MV Area (PHT): 5.93 cm     TR Peak grad:   10.1 mmHg MV Decel Time: 128 msec     TR Vmax:        159.00 cm/s MV E velocity: 62.60 cm/s MV A velocity: 109.00 cm/s  SHUNTS MV E/A ratio:  0.57         Systemic VTI:  0.19 m                             Systemic Diam: 2.00 cm Kathlyn Sacramento MD Electronically signed by Kathlyn Sacramento MD Signature Date/Time: 01/17/2021/11:27:49 AM    Final         Scheduled Meds: . amLODipine  5 mg Oral Daily  . apixaban  5 mg Oral BID  . aspirin EC  81 mg Oral Daily  . atorvastatin  10 mg Oral Daily  . buPROPion  150 mg Oral Daily  . docusate sodium  100 mg Oral BID  . escitalopram  10 mg Oral Daily  . levothyroxine  88 mcg Oral QAC breakfast  . methocarbamol  750 mg Oral TID  . mometasone-formoterol  2 puff Inhalation BID  . montelukast  10 mg Oral Daily  . polyethylene glycol  17 g Oral Daily  . pregabalin  75 mg Oral BID  . senna  1 tablet Oral BID  . tranexamic acid (CYKLOKAPRON) topical - INTRAOP  2,000 mg Topical 30 min Pre-Op  . traZODone  100 mg Oral QHS   Continuous Infusions: . sodium chloride 250 mL (01/14/21 0858)  . sodium chloride 250 mL (01/18/21 0209)  . ampicillin-sulbactam (UNASYN) IV 3 g (01/18/21 0759)     LOS: 7 days    Time spent: 15 minutes    Sidney Ace, MD [facility] Pager [phone]  If 7PM-7AM, please contact night-coverage 01/18/2021, 2:04 PM

## 2021-01-18 NOTE — Progress Notes (Signed)
Occupational Therapy Treatment Patient Details Name: [name] MRN: [id number] DOB: Sep 01, 1946 Today's Date: 01/18/2021    History of present illness Pt is a 75 y/o F who presents to ED from home for evaluation of L hip pain after a fall with minor head trauma but no LOC. Pt found to have L femur fx & underwent L IM nail placement by Dr. Harlow Mares on 01/14/21.   OT comments  Pt seen for OT treatment this date to f/u re: safety with ADLs/strengthening for ADL transfers and mobility. Pt somewhat self-limiting reporting she wont do therapy without pain and anxiety medication (although RN reports she's already had most recent dose). Pt is agreeable to bed level session. OT engages pt in UB ADLs with SETUP/SUPV including washing and drying face and applying lotion. Pt does requires MIN A to apply lotion to LB d/t limited ROM 2/2 pain. Pt agreeable to some bed level therex this session as well. OT facilitaets pt participation in gluteal squeezes for one set x15 reps. contralateral reaching for 5 alternating and 6 side-to-sides 3 per side, modified bed-level side bends) to improve core strength for balance, reaching and repositioniong. Pt with good understanding with demonstration of exercises, poor tolerance for core therex (2/2 respiratory status), hence lower repetitions.  Will continue to follow acutely. Continue to anticipate that pt will require skilled STR upon d/c from acute setting.    Follow Up Recommendations  SNF    Equipment Recommendations  Other (comment) (defer to next level of care)    Recommendations for Other Services      Precautions / Restrictions Precautions Precautions: Fall Restrictions Weight Bearing Restrictions: Yes RLE Weight Bearing: Partial weight bearing RLE Partial Weight Bearing Percentage or Pounds: post op shoe LLE Weight Bearing: Weight bearing as tolerated       Mobility Bed Mobility   General bed mobility comments: pt declines to get OOB with OT this date,  states she wants anxiety and pain medication first, upon calling RN, pt had already recieved her morning dose.    Transfers     Balance               ADL either performed or assessed with clinical judgement   ADL Overall ADL's : Needs assistance/impaired     Grooming: Wash/dry hands;Wash/dry face;Applying deodorant;Brushing hair;Supervision/safety;Set up;Bed level Grooming Details (indicate cue type and reason): and applying deodorant (MIN A For LB at bed level)                             Functional mobility during ADLs:  (pt declines to particiapte in ADL mobility/transfers with OT this date citing wanting to get pain and anxiety medication.)       Vision Baseline Vision/History: Wears glasses Wears Glasses: At all times Patient Visual Report: No change from baseline     Perception     Praxis      Cognition Arousal/Alertness: Awake/alert Behavior During Therapy: WFL for tasks assessed/performed Overall Cognitive Status: Within Functional Limits for tasks assessed                                 General Comments: HOH but alert and oriented x 3. poor insight of deficits with poor safety awareness        Exercises Other Exercises Other Exercises: OT facilitaets pt participation in gluteal squeezes for one set x15 reps. contralateral  reaching for 5 alternating and 6 side-to-sides 3 per side, modified bed-level side bends) to improve core strength for balance, reaching and repositioniong. pt with good understanding with demonstration of exercises, poor tolerance for core therex, hence lower repetitions.   Shoulder Instructions       General Comments      Pertinent Vitals/ Pain       Pain Assessment: 0-10 Pain Score: 7  Faces Pain Scale: Hurts even more Pain Location: LLE Pain Descriptors / Indicators: Guarding;Grimacing Pain Intervention(s): Limited activity within patient's tolerance;Monitored during session;Patient requesting pain  meds-RN notified  Home Living                                          Prior Functioning/Environment              Frequency  Min 1X/week        Progress Toward Goals  OT Goals(current goals can now be found in the care plan section)  Progress towards OT goals: Progressing toward goals  Acute Rehab OT Goals Patient Stated Goal: To go home OT Goal Formulation: With patient Time For Goal Achievement: 01/29/21 Potential to Achieve Goals: [place] Discharge plan remains appropriate;Frequency remains appropriate    Co-evaluation        PT goals addressed during session: Balance;Mobility/safety with mobility        AM-PAC OT "6 Clicks" Daily Activity     Outcome Measure   Help from another person eating meals?: A Little Help from another person taking care of personal grooming?: A Little Help from another person toileting, which includes using toliet, bedpan, or urinal?: A Lot Help from another person bathing (including washing, rinsing, drying)?: A Lot Help from another person to put on and taking off regular upper body clothing?: A Little Help from another person to put on and taking off regular lower body clothing?: A Lot 6 Click Score: 15    End of Session    OT Visit Diagnosis: Unsteadiness on feet (R26.81);Muscle weakness (generalized) (M62.81)   Activity Tolerance Patient tolerated treatment well;Other (comment) (somewhat self-limiting)   Patient Left in chair;with call bell/phone within reach;with chair alarm set   Nurse Communication Mobility status        Time: 1287-8676 OT Time Calculation (min): 34 min  Charges: OT General Charges $OT Visit: 1 Visit OT Treatments $Self Care/Home Management : 8-22 mins $Therapeutic Exercise: 8-22 mins  Gerrianne Scale, MS, OTR/L ascom [phone] 01/18/21, 3:47 PM

## 2021-01-18 NOTE — Progress Notes (Signed)
PT Cancellation Note  Patient Details Name: [name] MRN: [id number] DOB: 08/21/1946   Cancelled Treatment:     PT attempt. Pt currently working with OT. Have attempted pt twice today. Will return after lunch and continue to follow pt per POC. Will see BID this afternoon if pt willing.    Willette Pa 01/18/2021, 11:48 AM

## 2021-01-18 NOTE — Plan of Care (Signed)
  Problem: Health Behavior/Discharge Planning: Goal: Ability to manage health-related needs will improve Outcome: Progressing   Problem: Clinical Measurements: Goal: Will remain free from infection Outcome: Progressing   Problem: Education: Goal: Verbalization of understanding the information provided (i.e., activity precautions, restrictions, etc) will improve Outcome: Progressing Goal: Individualized Educational Video(s) Outcome: Progressing   Problem: Activity: Goal: Ability to ambulate and perform ADLs will improve Outcome: Progressing   Problem: Clinical Measurements: Goal: Postoperative complications will be avoided or minimized Outcome: Progressing   Problem: Self-Concept: Goal: Ability to maintain and perform role responsibilities to the fullest extent possible will improve Outcome: Progressing   Problem: Pain Management: Goal: Pain level will decrease Outcome: Progressing

## 2021-01-18 NOTE — Progress Notes (Signed)
   01/16/21 0445  Assess: MEWS Score  Temp 98 F (36.7 C)  BP (!) 126/59  Pulse Rate (!) 108  Resp (!) 22  Level of Consciousness Alert  SpO2 98 %  O2 Device Nasal Cannula  Assess: MEWS Score  MEWS Temp 0  MEWS Systolic 0  MEWS Pulse 1  MEWS RR 1  MEWS LOC 0  MEWS Score 2  MEWS Score Color Yellow  Assess: if the MEWS score is Yellow or Red  Were vital signs taken at a resting state? Yes  Focused Assessment No change from prior assessment  Early Detection of Sepsis Score *See Row Information* Medium  MEWS guidelines implemented *See Row Information* Yes  Treat  MEWS Interventions Administered prn meds/treatments  Pain Scale 0-10  Pain Score 8  Pain Type Surgical pain  Pain Location Hip  Pain Orientation Left  Pain Frequency Constant  Pain Onset On-going  Pain Intervention(s) Medication (See eMAR)  Take Vital Signs  Increase Vital Sign Frequency  Yellow: Q 2hr X 2 then Q 4hr X 2, if remains yellow, continue Q 4hrs  Escalate  MEWS: Escalate Yellow: discuss with charge nurse/RN and consider discussing with provider and RRT  Notify: Charge Nurse/RN  Name of Charge Nurse/RN Notified Miguel Dibble, RN  Date Charge Nurse/RN Notified 01/14/21  Time Charge Nurse/RN Notified 0530  Document  Patient Outcome Not stable and remains on department

## 2021-01-18 NOTE — TOC Progression Note (Signed)
Transition of Care [facility]) - Progression Note    Patient Details  Name: Dejah Droessler MRN: [id number] Date of Birth: December 30, 1945  Transition of Care [facility]) CM/SW Contact  Shelbie Ammons, RN Phone Number: 01/18/2021, 8:32 AM  Clinical Narrative:  RNCM reached out to patient by telephone to discuss bed offers of [facility] and [facility]. Patient chooses [facility] due to it's location being close to her house. Patient verbalizes that she is unsure if she is ready to go yet. Discussed that facility will have to obtain insurance authorization which can take a couple of days. RNCM accepted bed in hub and reached out to Barrera with [facility] to notify, facility will start insurance auth.      Expected Discharge Plan: Skilled Nursing Facility Barriers to Discharge: Continued Medical Work up  Expected Discharge Plan and Services Expected Discharge Plan: [place]       Living arrangements for the past 2 months: Single Family Home                                       Social Determinants of Health (SDOH) Interventions    Readmission Risk Interventions Readmission Risk Prevention Plan 01/14/2021  PCP or Specialist Appt within 3-5 Days Complete  HRI or [place] Complete  Social Work Consult for [place] Planning/Counseling Complete  Palliative Care Screening Not Applicable  Medication Review [employer]) Complete  Some recent data might be hidden

## 2021-01-18 NOTE — Care Management Important Message (Signed)
Important Message  Patient Details  Name: [name] MRN: [id number] Date of Birth: 1946/09/07   Medicare Important Message Given:  Yes     Juliann Pulse A Yulissa Needham 01/18/2021, 10:39 AM

## 2021-01-19 ENCOUNTER — Encounter: Payer: Self-pay | Admitting: Internal Medicine

## 2021-01-19 DIAGNOSIS — S72002D Fracture of unspecified part of neck of left femur, subsequent encounter for closed fracture with routine healing: Secondary | ICD-10-CM

## 2021-01-19 LAB — CBC WITH DIFFERENTIAL/PLATELET
Abs Immature Granulocytes: 0.14 10*3/uL — ABNORMAL HIGH (ref 0.00–0.07)
Basophils Absolute: 0.1 10*3/uL (ref 0.0–0.1)
Basophils Relative: 1 %
Eosinophils Absolute: 0.4 10*3/uL (ref 0.0–0.5)
Eosinophils Relative: 4 %
HCT: 24.7 % — ABNORMAL LOW (ref 36.0–46.0)
Hemoglobin: 7.8 g/dL — ABNORMAL LOW (ref 12.0–15.0)
Immature Granulocytes: 2 %
Lymphocytes Relative: 29 %
Lymphs Abs: 2.4 10*3/uL (ref 0.7–4.0)
MCH: 29.1 pg (ref 26.0–34.0)
MCHC: 31.6 g/dL (ref 30.0–36.0)
MCV: 92.2 fL (ref 80.0–100.0)
Monocytes Absolute: 0.5 10*3/uL (ref 0.1–1.0)
Monocytes Relative: 6 %
Neutro Abs: 5.1 10*3/uL (ref 1.7–7.7)
Neutrophils Relative %: 58 %
Platelets: 403 10*3/uL — ABNORMAL HIGH (ref 150–400)
RBC: 2.68 MIL/uL — ABNORMAL LOW (ref 3.87–5.11)
RDW: 14.9 % (ref 11.5–15.5)
WBC: 8.5 10*3/uL (ref 4.0–10.5)
nRBC: 0 % (ref 0.0–0.2)

## 2021-01-19 LAB — BASIC METABOLIC PANEL
Anion gap: 6 (ref 5–15)
BUN: 15 mg/dL (ref 8–23)
CO2: 28 mmol/L (ref 22–32)
Calcium: 8.3 mg/dL — ABNORMAL LOW (ref 8.9–10.3)
Chloride: 106 mmol/L (ref 98–111)
Creatinine, Ser: 0.54 mg/dL (ref 0.44–1.00)
GFR, Estimated: >60 mL/min (ref >60)
Glucose, Bld: 89 mg/dL (ref 70–99)
Potassium: 3.9 mmol/L (ref 3.5–5.1)
Sodium: 140 mmol/L (ref 135–145)

## 2021-01-19 LAB — RESP PANEL BY RT-PCR (FLU A&B, COVID) ARPGX2
Influenza A by PCR: NEGATIVE
Influenza B by PCR: NEGATIVE
SARS Coronavirus 2 by RT PCR: NEGATIVE

## 2021-01-19 NOTE — Progress Notes (Signed)
Physical Therapy Treatment Patient Details Name: [name] MRN: [id number] DOB: 01-24-1946 Today's Date: 01/19/2021    History of Present Illness Pt is a 75 y/o F who presents to ED from home for evaluation of L hip pain after a fall with minor head trauma but no LOC. Pt found to have L femur fx & underwent L IM nail placement by Dr. Harlow Mares on 01/14/21.    PT Comments    Pt was long sitting in bed upon arriving. On 2 L o2 with resting sats 91%. Pt unable to donn post op shoe independently. She was able to exit R side of bed with min assist+ HOB elevated. Stood to [employer] with min assist. Pt struggles limiting wt on RLE even with max vcs. She ambulated ~ 8 ft with RW prior to endorsing severe fatigue. Does desaturate to 86% on 2 L however with short seated rest does recover to 92% after ~ 2 minutes rest. Pt unwilling to trial further gait distances. Issued HEP handout and had pt perform. Pt demonstrated no active LLE dorsiflexion even with great effort. Notified MD team. At conclusion of session, pt was in recliner with call bell in reach, chair alarm in place, and RN staff aware of pt's abilities. Acute PT highly recommend DC to SNF to address deficits while maximizing independence.     Follow Up Recommendations  SNF;Supervision/Assistance - 24 hour     Equipment Recommendations  None recommended by PT       Precautions / Restrictions Precautions Precautions: Fall Restrictions Weight Bearing Restrictions: Yes LLE Weight Bearing: Weight bearing as tolerated    Mobility  Bed Mobility Overal bed mobility: Needs Assistance Bed Mobility: Supine to Sit     Supine to sit: Min assist     General bed mobility comments: MIn assist to exit R sid eof bed with increased tie and vcs for improved technique and sequencing.    Transfers Overall transfer level: Needs assistance Equipment used: Rolling walker (2 wheeled) Transfers: Sit to/from Stand Sit to Stand: Min assist         General  transfer comment: Pt required min assist + vcs for improved sequencing  Ambulation/Gait Ambulation/Gait assistance: Min assist   Assistive device: Rolling walker (2 wheeled) Gait Pattern/deviations: Step-to pattern;Antalgic;Trunk flexed Gait velocity: decreased   General Gait Details: pt was able to stand from slightly elevated bed height with min assist. does need cues for improved safety/technique      Balance Overall balance assessment: Needs assistance Sitting-balance support: Bilateral upper extremity supported;Feet supported Sitting balance-Leahy Scale: Good Sitting balance - Comments: supervision sitting EOB   Standing balance support: During functional activity;Bilateral upper extremity supported Standing balance-Leahy Scale: Fair       Cognition Arousal/Alertness: Awake/alert Behavior During Therapy: WFL for tasks assessed/performed Overall Cognitive Status: Within Functional Limits for tasks assessed      General Comments: HOH but alert and oriented x 3. poor insight of deficits with poor safety awareness             Pertinent Vitals/Pain Faces Pain Scale: Hurts even more Pain Location: LLE Pain Descriptors / Indicators: Guarding;Grimacing           PT Goals (current goals can now be found in the care plan section) Acute Rehab PT Goals Patient Stated Goal: To go home    Frequency    BID      PT Plan Current plan remains appropriate       AM-PAC PT "6 Clicks" Mobility  Outcome Measure  Help needed turning from your back to your side while in a flat bed without using bedrails?: A Little Help needed moving from lying on your back to sitting on the side of a flat bed without using bedrails?: A Little Help needed moving to and from a bed to a chair (including a wheelchair)?: A Little Help needed standing up from a chair using your arms (e.g., wheelchair or bedside chair)?: A Little Help needed to walk in hospital room?: A Lot Help needed  climbing 3-5 steps with a railing? : Total 6 Click Score: 15    End of Session Equipment Utilized During Treatment: Oxygen;Gait belt Activity Tolerance: Patient tolerated treatment well;Patient limited by fatigue Patient left: in chair;with call bell/phone within reach;with chair alarm set Nurse Communication: Mobility status PT Visit Diagnosis: Difficulty in walking, not elsewhere classified (R26.2);Muscle weakness (generalized) (M62.81);Pain Pain - Right/Left: Left     Time:  -     Charges:                        Julaine Fusi PTA 01/19/21, 7:59 AM

## 2021-01-19 NOTE — TOC Progression Note (Addendum)
Transition of Care [facility]) - Progression Note    Patient Details  Name: Willye Javier MRN: [id number] Date of Birth: 1946/05/28  Transition of Care [facility]) CM/SW [place], LCSW Phone Number: 01/19/2021, 9:00 AM  Clinical Narrative:   CSW reached out to Tammy at [facility] to inquire about status of insurance auth. Waiting for an update.  1:55- Tammy reported she has obtained insurance auth and will have a bed for patient at [facility] tomorrow. CSW asked MD and RN for a COVID test.  Expected Discharge Plan: [place] Barriers to Discharge: Continued Medical Work up  Expected Discharge Plan and Services Expected Discharge Plan: [place]       Living arrangements for the past 2 months: Single Family Home                                       Social Determinants of Health (SDOH) Interventions    Readmission Risk Interventions Readmission Risk Prevention Plan 01/14/2021  PCP or Specialist Appt within 3-5 Days Complete  HRI or Home Care Consult Complete  Social Work Consult for [place] Planning/Counseling Complete  Palliative Care Screening Not Applicable  Medication Review Press photographer) Complete  Some recent data might be hidden

## 2021-01-19 NOTE — Progress Notes (Signed)
Physical Therapy Treatment Patient Details Name: [name] MRN: [id number] DOB: [birth year] Today's Date: 01/19/2021    History of Present Illness Pt is a 75 y/o F who presents to ED from home for evaluation of L hip pain after a fall with minor head trauma but no LOC. Pt found to have L femur fx & underwent L IM nail placement by Dr. Harlow Mares on 01/14/21.    PT Comments    Pt was sitting in recliner with RN at bedside upon arriving. She requested to return to bed and continues to endorse severe pain in L hip. Re-applied post op shoe to RLE prior to pt standing and ambulating to EOB. Vcs throughout for limiting wt bearing on RLE. She becomes SOB quickly with elevated HR however vitals stable. Once returned to bed, tolerated a few there ex prior to fatigue and requesting to discontinue session. Overall pt is improving but slowly. Anxiety and pain most limiting. She will benefit form SNF at DC to address deficits while assisting pt to PLOF.     Follow Up Recommendations  SNF;Supervision/Assistance - 24 hour     Equipment Recommendations  None recommended by PT    Recommendations for Other Services       Precautions / Restrictions Precautions Precautions: Fall Restrictions Weight Bearing Restrictions: Yes RLE Weight Bearing: Partial weight bearing LLE Weight Bearing: Weight bearing as tolerated    Mobility  Bed Mobility Overal bed mobility: Needs Assistance Bed Mobility: Sit to Supine     Supine to sit: Min assist Sit to supine: Min assist   General bed mobility comments: Min assist to progress BLEs into bed.    Transfers Overall transfer level: Needs assistance Equipment used: Rolling walker (2 wheeled) Transfers: Sit to/from Stand Sit to Stand: Min guard         General transfer comment: CGA for safety  Ambulation/Gait Ambulation/Gait assistance: Min assist Gait Distance (Feet): 5 Feet Assistive device: Rolling walker (2 wheeled) Gait Pattern/deviations: Step-to  pattern;Antalgic;Trunk flexed Gait velocity: decreased   General Gait Details: Pt has SOB with very minimal activity. SOB noted with HR levation to 130s. poor ability to limit wt on RLE. endorses severe pain in L hip wt all movements/exercises      Balance Overall balance assessment: Needs assistance Sitting-balance support: Bilateral upper extremity supported;Feet supported Sitting balance-Leahy Scale: Good     Standing balance support: During functional activity;Bilateral upper extremity supported Standing balance-Leahy Scale: Fair Standing balance comment: reliant on UE support for safety         Cognition Arousal/Alertness: Awake/alert Behavior During Therapy: WFL for tasks assessed/performed Overall Cognitive Status: Within Functional Limits for tasks assessed      General Comments: Pt is HOH but agreeable to PT session once pre-medicated. Pt is very anxious. She was on 3 L o2 with sao2 > 90%         General Comments General comments (skin integrity, edema, etc.): pt performed several ther ex in bed prior to c/o fatigue requesting to discontinue remainder of session      Pertinent Vitals/Pain Pain Assessment: 0-10 Pain Score: 6  Faces Pain Scale: Hurts even more Pain Location: LLE Pain Descriptors / Indicators: Guarding;Grimacing Pain Intervention(s): Limited activity within patient's tolerance;Monitored during session;Premedicated before session;Repositioned           PT Goals (current goals can now be found in the care plan section) Acute Rehab PT Goals Patient Stated Goal: To go to rehab then home Progress towards PT goals: Progressing  toward goals    Frequency    BID      PT Plan Current plan remains appropriate    Co-evaluation     PT goals addressed during session: Mobility/safety with mobility        AM-PAC PT "6 Clicks" Mobility   Outcome Measure  Help needed turning from your back to your side while in a flat bed without using  bedrails?: A Little Help needed moving from lying on your back to sitting on the side of a flat bed without using bedrails?: A Little Help needed moving to and from a bed to a chair (including a wheelchair)?: A Little Help needed standing up from a chair using your arms (e.g., wheelchair or bedside chair)?: A Lot Help needed to walk in hospital room?: A Lot Help needed climbing 3-5 steps with a railing? : A Lot 6 Click Score: 15    End of Session Equipment Utilized During Treatment: Oxygen;Gait belt Activity Tolerance: Patient limited by fatigue;Patient limited by pain Patient left: in bed;with call bell/phone within reach;with bed alarm set Nurse Communication: Mobility status PT Visit Diagnosis: Difficulty in walking, not elsewhere classified (R26.2);Muscle weakness (generalized) (M62.81);Pain Pain - Right/Left: Left     Time: 2094-7096 PT Time Calculation (min) (ACUTE ONLY): 12 min  Charges:  $Therapeutic Activity: 8-22 mins                     Julaine Fusi PTA 01/19/21, 3:56 PM

## 2021-01-19 NOTE — Progress Notes (Signed)
Physical Therapy Treatment Patient Details Name: [name] MRN: [id number] DOB: 1946/09/25 Today's Date: 01/19/2021    History of Present Illness Pt is a 75 y/o F who presents to ED from home for evaluation of L hip pain after a fall with minor head trauma but no LOC. Pt found to have L femur fx & underwent L IM nail placement by Dr. Harlow Mares on 01/14/21.    PT Comments     Pt was long sitting in bed upon arriving. She is more anxious today than previous date. Continues to request pain med/anxiety meds prior to OOB activity. On 3 L o2 with sao2 88%. Discussed relaxation technique and PLB techniques to improve breathing. She was able to improve sao2 91% quickly. Constant vcs throughout session for relaxation. She was able to exit L side of bed with min assist. Sat EOB x several minutes prior to standing and ambulating to recliner ~ 8 ft away. Pt was self limiting. Easily could have ambulated further but due to SOB/anxiety requested seated rest." I need my inhaler/xanax" RN aware. Pt overall is progressing towards goals but is limited by anxiety/SOB/ and PWB restrictions. Will greatly benefit from SNF at DC to address deficits while improving confidence and safety with ADL.    Follow Up Recommendations  SNF;Supervision/Assistance - 24 hour     Equipment Recommendations  None recommended by PT       Precautions / Restrictions Precautions Precautions: Fall Restrictions Weight Bearing Restrictions: Yes RLE Weight Bearing: Partial weight bearing LLE Weight Bearing: Weight bearing as tolerated    Mobility  Bed Mobility Overal bed mobility: Needs Assistance Bed Mobility: Supine to Sit     Supine to sit: Min assist     General bed mobility comments: min assist for safety to progress from long sit to short sit EOB. pt gets very anxious but with relaxation techniques, does control SOB.    Transfers Overall transfer level: Needs assistance Equipment used: Rolling walker (2  wheeled) Transfers: Sit to/from Stand Sit to Stand: Min guard         General transfer comment: CGA for safety. vcs for adhering to [facility] restrictions  Ambulation/Gait Ambulation/Gait assistance: Min assist Gait Distance (Feet): 8 Feet Assistive device: Rolling walker (2 wheeled) Gait Pattern/deviations: Step-to pattern;Antalgic;Trunk flexed Gait velocity: decreased   General Gait Details: pt gets very worked up with very minimal activity. sao2 86% with SOB noted. Recovers to 92% after 1 minute with breathing techniques (PLB)          Cognition Arousal/Alertness: Awake/alert Behavior During Therapy: WFL for tasks assessed/performed Overall Cognitive Status: Within Functional Limits for tasks assessed        General Comments: Pt is HOH but agreeable to PT session once pre-medicated. Pt is very anxious. She was on 3 L o2 with sao2 88% at rest.         General Comments General comments (skin integrity, edema, etc.): Reviewed importance of performing ther ex. " I've been performing every day on my own." unwilling to perform at this time.      Pertinent Vitals/Pain Pain Assessment: 0-10 Pain Score: 7  Faces Pain Scale: Hurts even more Pain Location: LLE Pain Descriptors / Indicators: Guarding;Grimacing Pain Intervention(s): Limited activity within patient's tolerance;Monitored during session;Premedicated before session;Repositioned           PT Goals (current goals can now be found in the care plan section) Acute Rehab PT Goals Patient Stated Goal: To go home Progress towards PT goals: Progressing  toward goals    Frequency    BID      PT Plan Current plan remains appropriate    Co-evaluation     PT goals addressed during session: Mobility/safety with mobility;Balance;Proper use of DME;Strengthening/ROM        AM-PAC PT "6 Clicks" Mobility   Outcome Measure  Help needed turning from your back to your side while in a flat bed without using bedrails?: A  Little Help needed moving from lying on your back to sitting on the side of a flat bed without using bedrails?: A Little Help needed moving to and from a bed to a chair (including a wheelchair)?: A Little Help needed standing up from a chair using your arms (e.g., wheelchair or bedside chair)?: A Lot Help needed to walk in hospital room?: Total   6 Click Score: 12    End of Session Equipment Utilized During Treatment: Oxygen;Gait belt Activity Tolerance: Patient tolerated treatment well;Patient limited by fatigue Patient left: in chair;with call bell/phone within reach;with chair alarm set Nurse Communication: Mobility status PT Visit Diagnosis: Difficulty in walking, not elsewhere classified (R26.2);Muscle weakness (generalized) (M62.81);Pain Pain - Right/Left: Left     Time: 1140-1156 PT Time Calculation (min) (ACUTE ONLY): 16 min  Charges:  $Therapeutic Activity: 8-22 mins                     Julaine Fusi PTA 01/19/21, 12:16 PM

## 2021-01-19 NOTE — Progress Notes (Addendum)
Progress Note    Laylee Schooley  PJA:[id number] DOB: Aug 26, 1946  DOA: 01/11/2021 PCP: Albina Billet, MD      Brief Narrative:    Medical records reviewed and are as summarized below:  [name] is a 75 y.o. female       Assessment/Plan:   Principal Problem:   Fracture of femoral neck, left, closed ([place]) Active Problems:   HLD (hyperlipidemia)   Hypothyroidism   Anxiety and depression   Hypomagnesemia   Chronic obstructive pulmonary disease (COPD) ([place])   PAD (peripheral artery disease) ([facility])   Chronic respiratory failure with hypoxia ([facility])   Hypokalemia   Body mass index is 20.6 kg/m.   Left femoral neck fracture: S/p left intramedullary nail intertrochanteric on 01/14/2021.  Continue analgesics as needed for pain.  Follow-up with orthopedic surgeon.  Acute blood loss anemia: H&H is stable.  Continue to monitor.  Right basilar pneumonia likely aspiration pneumonia: Completed antibiotics ([facility]).  PAD: S/p recent admission with angiogram of right lower extremity on 12/23/2020 with mechanical thrombectomy and catheter directed thrombolytic therapy to the right SFA, popliteal artery and anterior tibial artery.  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  Continue Eliquis and aspirin  COPD with chronic hypoxic respiratory failure: Continue oxygen therapy and bronchodilators.  Other comorbidities include anxiety, depression, hyperlipidemia, hypothyroidism, hypertension, PAD.   Diet Order            Diet regular Room service appropriate? Yes; Fluid consistency: Thin  Diet effective now                    Consultants:  Orthopedic surgeon  Procedures:  Left intramedullary nail intertrochanteric 01/14/2021    Medications:   . amLODipine  5 mg Oral Daily  . apixaban  5 mg Oral BID  . aspirin EC  81 mg Oral Daily  . atorvastatin  10 mg Oral Daily  . buPROPion  150 mg Oral Daily  . docusate sodium  100 mg Oral BID  . escitalopram  10 mg Oral  Daily  . levothyroxine  88 mcg Oral QAC breakfast  . methocarbamol  750 mg Oral TID  . mometasone-formoterol  2 puff Inhalation BID  . montelukast  10 mg Oral Daily  . polyethylene glycol  17 g Oral Daily  . pregabalin  75 mg Oral BID  . senna  1 tablet Oral BID  . tranexamic acid (CYKLOKAPRON) topical - INTRAOP  2,000 mg Topical 30 min Pre-Op  . traZODone  100 mg Oral QHS   Continuous Infusions: . sodium chloride 250 mL (01/14/21 0858)  . sodium chloride 250 mL (01/18/21 0209)     Anti-infectives (From admission, onward)   Start     Dose/Rate Route Frequency Ordered Stop   01/13/21 2000  Ampicillin-Sulbactam (UNASYN) 3 g in sodium chloride 0.9 % 100 mL IVPB        3 g 200 mL/hr over 30 Minutes Intravenous Every 6 hours 01/13/21 1904 01/18/21 1959             Family Communication/Anticipated D/C date and plan/Code Status   DVT prophylaxis: SCDs Start: 01/14/21 1701 SCDs Start: 01/11/21 2145 apixaban (ELIQUIS) tablet 5 mg     Code Status: Full Code  Family Communication: None Disposition Plan:    Status is: Inpatient  Remains inpatient appropriate because:Unsafe d/c plan   Dispo: The patient is from: Home  Anticipated d/c is to: SNF              Patient currently is medically stable to d/c.   Difficult to place patient Yes           Subjective:   Interval events noted.  C/o left hip pain.  Objective:    Vitals:   01/19/21 0002 01/19/21 0425 01/19/21 0826 01/19/21 1156  BP: 124/72 136/67 (!) 154/84 110/63  Pulse: 94 90 (!) 110 97  Resp: 16 16 16 17   Temp: (!) 97.4 F (36.3 C) 97.7 F (36.5 C) 98.3 F (36.8 C) 99.1 F (37.3 C)  TempSrc: Oral  Oral Oral  SpO2: 100% 98% 100% 99%  Weight:      Height:       No data found.   Intake/Output Summary (Last 24 hours) at 01/19/2021 1543 Last data filed at 01/19/2021 2637 Gross per 24 hour  Intake --  Output 1450 ml  Net -1450 ml   Filed Weights   01/11/21 1842  Weight: 54.4  kg    Exam:  GEN: NAD SKIN: Warm and dry EYES: No pallor or icterus ENT: MMM CV: RRR PULM: CTA B ABD: soft, ND, NT, +BS CNS: AAO x 3, non focal EXT: Dressing on left hip surgical wound is clean, dry and intact.        Data Reviewed:   I have personally reviewed following labs and imaging studies:  Labs: Labs show the following:   Basic Metabolic Panel: Recent Labs  Lab 01/15/21 0727 01/16/21 0630 01/17/21 0617 01/18/21 0454 01/19/21 0447  NA 138 138 142 142 140  K 4.6 3.5 3.9 3.8 3.9  CL 104 102 104 107 106  CO2 25 26 30 30 28   GLUCOSE 144* 121* 92 98 89  BUN 14 15 14 14 15   CREATININE 0.56 0.52 0.67 0.57 0.54  CALCIUM 8.3* 8.0* 8.3* 8.3* 8.3*   GFR Estimated Creatinine Clearance: 53.8 mL/min (by C-G formula based on SCr of 0.54 mg/dL). Liver Function Tests: No results for input(s): AST, ALT, ALKPHOS, BILITOT, PROT, ALBUMIN in the last 168 hours. No results for input(s): LIPASE, AMYLASE in the last 168 hours. No results for input(s): AMMONIA in the last 168 hours. Coagulation profile Recent Labs  Lab 01/13/21 0816 01/14/21 0729  INR 1.1 1.2    CBC: Recent Labs  Lab 01/15/21 0727 01/16/21 0630 01/17/21 0617 01/18/21 0454 01/19/21 0447  WBC 9.3 8.2 6.9 7.3 8.5  NEUTROABS 7.9* 6.1 4.0 4.6 5.1  HGB 8.3* 7.7* 8.4* 7.5* 7.8*  HCT 25.9* 24.4* 27.1* 25.0* 24.7*  MCV 90.6 90.0 92.2 92.3 92.2  PLT 264 373 384 394 403*   Cardiac Enzymes: No results for input(s): CKTOTAL, CKMB, CKMBINDEX, TROPONINI in the last 168 hours. BNP (last 3 results) No results for input(s): PROBNP in the last 8760 hours. CBG: No results for input(s): GLUCAP in the last 168 hours. D-Dimer: No results for input(s): DDIMER in the last 72 hours. Hgb A1c: No results for input(s): HGBA1C in the last 72 hours. Lipid Profile: No results for input(s): CHOL, HDL, LDLCALC, TRIG, CHOLHDL, LDLDIRECT in the last 72 hours. Thyroid function studies: No results for input(s): TSH,  T4TOTAL, T3FREE, THYROIDAB in the last 72 hours.  Invalid input(s): FREET3 Anemia work up: No results for input(s): VITAMINB12, FOLATE, FERRITIN, TIBC, IRON, RETICCTPCT in the last 72 hours. Sepsis Labs: Recent Labs  Lab 01/13/21 1208 01/14/21 0729 01/16/21 0630 01/17/21 0617 01/18/21 0454 01/19/21 0447  PROCALCITON 0.36  --   --   --   --   --  WBC 14.0*   < > 8.2 6.9 7.3 8.5   < > = values in this interval not displayed.    Microbiology Recent Results (from the past 240 hour(s))  Resp Panel by RT-PCR (Flu A&B, Covid) Nasopharyngeal Swab     Status: None   Collection Time: 01/11/21  8:35 PM   Specimen: Nasopharyngeal Swab; Nasopharyngeal(NP) swabs in vial transport medium  Result Value Ref Range Status   SARS Coronavirus 2 by RT PCR NEGATIVE NEGATIVE Final    Comment: (NOTE) SARS-CoV-2 target nucleic acids are NOT DETECTED.  The SARS-CoV-2 RNA is generally detectable in upper respiratory specimens during the acute phase of infection. The lowest concentration of SARS-CoV-2 viral copies this assay can detect is 138 copies/mL. A negative result does not preclude SARS-Cov-2 infection and should not be used as the sole basis for treatment or other patient management decisions. A negative result may occur with  improper specimen collection/handling, submission of specimen other than nasopharyngeal swab, presence of viral mutation(s) within the areas targeted by this assay, and inadequate number of viral copies(<138 copies/mL). A negative result must be combined with clinical observations, patient history, and epidemiological information. The expected result is Negative.  Fact Sheet for Patients:  EntrepreneurPulse.com.au  Fact Sheet for Healthcare Providers:  IncredibleEmployment.be  This test is no t yet approved or cleared by the [place] FDA and  has been authorized for detection and/or diagnosis of SARS-CoV-2 by FDA under an  Emergency Use Authorization (EUA). This EUA will remain  in effect (meaning this test can be used) for the duration of the COVID-19 declaration under Section 564(b)(1) of the Act, 21 U.S.C.section 360bbb-3(b)(1), unless the authorization is terminated  or revoked sooner.       Influenza A by PCR NEGATIVE NEGATIVE Final   Influenza B by PCR NEGATIVE NEGATIVE Final    Comment: (NOTE) The Xpert Xpress SARS-CoV-2/FLU/RSV plus assay is intended as an aid in the diagnosis of influenza from Nasopharyngeal swab specimens and should not be used as a sole basis for treatment. Nasal washings and aspirates are unacceptable for Xpert Xpress SARS-CoV-2/FLU/RSV testing.  Fact Sheet for Patients: EntrepreneurPulse.com.au  Fact Sheet for Healthcare Providers: IncredibleEmployment.be  This test is not yet approved or cleared by the [place] FDA and has been authorized for detection and/or diagnosis of SARS-CoV-2 by FDA under an Emergency Use Authorization (EUA). This EUA will remain in effect (meaning this test can be used) for the duration of the COVID-19 declaration under Section 564(b)(1) of the Act, 21 U.S.C. section 360bbb-3(b)(1), unless the authorization is terminated or revoked.  Performed at [facility], [address] [place], [place] [address]   Resp Panel by RT-PCR (Flu A&B, Covid) Nasopharyngeal Swab     Status: None   Collection Time: 01/19/21  2:11 PM   Specimen: Nasopharyngeal Swab; Nasopharyngeal(NP) swabs in vial transport medium  Result Value Ref Range Status   SARS Coronavirus 2 by RT PCR NEGATIVE NEGATIVE Final    Comment: (NOTE) SARS-CoV-2 target nucleic acids are NOT DETECTED.  The SARS-CoV-2 RNA is generally detectable in upper respiratory specimens during the acute phase of infection. The lowest concentration of SARS-CoV-2 viral copies this assay can detect is 138 copies/mL. A negative result does not preclude  SARS-Cov-2 infection and should not be used as the sole basis for treatment or other patient management decisions. A negative result may occur with  improper specimen collection/handling, submission of specimen other than nasopharyngeal swab, presence of viral mutation(s) within the  areas targeted by this assay, and inadequate number of viral copies(<138 copies/mL). A negative result must be combined with clinical observations, patient history, and epidemiological information. The expected result is Negative.  Fact Sheet for Patients:  EntrepreneurPulse.com.au  Fact Sheet for Healthcare Providers:  IncredibleEmployment.be  This test is no t yet approved or cleared by the [place] FDA and  has been authorized for detection and/or diagnosis of SARS-CoV-2 by FDA under an Emergency Use Authorization (EUA). This EUA will remain  in effect (meaning this test can be used) for the duration of the COVID-19 declaration under Section 564(b)(1) of the Act, 21 U.S.C.section 360bbb-3(b)(1), unless the authorization is terminated  or revoked sooner.       Influenza A by PCR NEGATIVE NEGATIVE Final   Influenza B by PCR NEGATIVE NEGATIVE Final    Comment: (NOTE) The Xpert Xpress SARS-CoV-2/FLU/RSV plus assay is intended as an aid in the diagnosis of influenza from Nasopharyngeal swab specimens and should not be used as a sole basis for treatment. Nasal washings and aspirates are unacceptable for Xpert Xpress SARS-CoV-2/FLU/RSV testing.  Fact Sheet for Patients: EntrepreneurPulse.com.au  Fact Sheet for Healthcare Providers: IncredibleEmployment.be  This test is not yet approved or cleared by the [place] FDA and has been authorized for detection and/or diagnosis of SARS-CoV-2 by FDA under an Emergency Use Authorization (EUA). This EUA will remain in effect (meaning this test can be used) for the duration of  the COVID-19 declaration under Section 564(b)(1) of the Act, 21 U.S.C. section 360bbb-3(b)(1), unless the authorization is terminated or revoked.  Performed at [facility], [address] [place], [place] [address]     Procedures and diagnostic studies:  No results found.             LOS: 8 days   Melvine Julin  [facility]   Pager on [email or web address]. If 7PM-7AM, please contact night-coverage at [email or web address]     01/19/2021, 3:43 PM

## 2021-01-20 LAB — CBC WITH DIFFERENTIAL/PLATELET
Abs Immature Granulocytes: 0.14 10*3/uL — ABNORMAL HIGH (ref 0.00–0.07)
Basophils Absolute: 0 10*3/uL (ref 0.0–0.1)
Basophils Relative: 1 %
Eosinophils Absolute: 0.3 10*3/uL (ref 0.0–0.5)
Eosinophils Relative: 4 %
HCT: 27.1 % — ABNORMAL LOW (ref 36.0–46.0)
Hemoglobin: 8 g/dL — ABNORMAL LOW (ref 12.0–15.0)
Immature Granulocytes: 2 %
Lymphocytes Relative: 31 %
Lymphs Abs: 2.3 10*3/uL (ref 0.7–4.0)
MCH: 27.9 pg (ref 26.0–34.0)
MCHC: 29.5 g/dL — ABNORMAL LOW (ref 30.0–36.0)
MCV: 94.4 fL (ref 80.0–100.0)
Monocytes Absolute: 0.5 10*3/uL (ref 0.1–1.0)
Monocytes Relative: 7 %
Neutro Abs: 4.2 10*3/uL (ref 1.7–7.7)
Neutrophils Relative %: 55 %
Platelets: 409 10*3/uL — ABNORMAL HIGH (ref 150–400)
RBC: 2.87 MIL/uL — ABNORMAL LOW (ref 3.87–5.11)
RDW: 14.7 % (ref 11.5–15.5)
WBC: 7.4 10*3/uL (ref 4.0–10.5)
nRBC: 0 % (ref 0.0–0.2)

## 2021-01-20 LAB — BASIC METABOLIC PANEL
Anion gap: 6 (ref 5–15)
BUN: 15 mg/dL (ref 8–23)
CO2: 26 mmol/L (ref 22–32)
Calcium: 8.5 mg/dL — ABNORMAL LOW (ref 8.9–10.3)
Chloride: 108 mmol/L (ref 98–111)
Creatinine, Ser: 0.54 mg/dL (ref 0.44–1.00)
GFR, Estimated: >60 mL/min (ref >60)
Glucose, Bld: 80 mg/dL (ref 70–99)
Potassium: 4.1 mmol/L (ref 3.5–5.1)
Sodium: 140 mmol/L (ref 135–145)

## 2021-01-20 MED ORDER — ALPRAZOLAM 0.5 MG PO TABS: 0.5000 mg | ORAL_TABLET | Freq: Four times a day (QID) | ORAL | 0 refills | Status: DC | PRN

## 2021-01-20 MED ORDER — OXYCODONE HCL 5 MG PO TABS: 5.0000 mg | ORAL_TABLET | Freq: Four times a day (QID) | ORAL | 0 refills | Status: DC | PRN

## 2021-01-20 NOTE — Progress Notes (Signed)
Daily Progress Note   Subjective  - 6 Days Post-Op  Follow-up third toe amputation.  Objective Vitals:   01/19/21 1615 01/19/21 2027 01/19/21 2347 01/20/21 0423  BP: 115/70 120/72 122/69 108/60  Pulse: 98 (!) 102 (!) 102 (!) 103  Resp: 17 16 16 16   Temp: 97.6 F (36.4 C) 98.4 F (36.9 C) 97.6 F (36.4 C) 98.2 F (36.8 C)  TempSrc: Oral Oral Oral Oral  SpO2: 100% 100% 95% 100%  Weight:      Height:        Physical Exam: The third toe amputation site is completely coapted.  Sutures removed.  100% healing of the third toe is noted.  Unfortunately the distal aspect of the second toe is necrotic.  Some of the superficial necrosis was removed today.  It appears that this is dry gangrene of the distal second toe from about the level of the PIPJ distally.  Laboratory CBC    Component Value Date/Time   WBC 7.4 01/20/2021 0633   HGB 8.0 (L) 01/20/2021 0633   HCT 27.1 (L) 01/20/2021 0633   PLT 409 (H) 01/20/2021 0633    BMET    Component Value Date/Time   NA 140 01/20/2021 0633   NA 143 10/06/2019 1552   NA 138 02/10/2014 1335   K 4.1 01/20/2021 0633   K 4.5 02/10/2014 1335   CL 108 01/20/2021 0633   CL 106 02/10/2014 1335   CO2 26 01/20/2021 0633   CO2 28 02/10/2014 1335   GLUCOSE 80 01/20/2021 0633   GLUCOSE 99 02/10/2014 1335   BUN 15 01/20/2021 0633   BUN 7 (L) 10/06/2019 1552   BUN 6 (L) 02/10/2014 1335   CREATININE 0.54 01/20/2021 0633   CREATININE 0.89 02/10/2014 1335   CALCIUM 8.5 (L) 01/20/2021 0633   CALCIUM 8.7 02/10/2014 1335   GFRNONAA >60 01/20/2021 0633   GFRNONAA >60 02/10/2014 1335   GFRAA >60 12/18/2019 1012   GFRAA >60 02/10/2014 1335    Assessment/Planning: Status post third toe amputation healed Gangrene second toe right foot   She is being discharged to skilled nursing facility for rehab for hip fracture.  The gangrenous second toe is stable and dry at this point.  This can be performed outpatient.  I will have her follow-up with me  in the next 2 to 3 weeks.  When she is back up and ambulatory may be able to perform outpatient amputation at that time.  Dry dressing to the second toe should be evaluated daily at the skilled nursing facility.  Samara Deist A  01/20/2021, 7:49 AM

## 2021-01-20 NOTE — Discharge Planning (Signed)
IV removed.  DC Packet printed to be sent to [facility].  Attempted to call report 3 different times (Finally got operator, but noone answered on floor to take report - 3 different times).  Transport has been called to take to room 610B.  Will write report phone # on DC packet, so receiving RN can call if desired.

## 2021-01-20 NOTE — Progress Notes (Signed)
Patient L hip dressing changed, sacral foam placed, Right foot necrotic toe dressed with gauze, bilateral heel foams placed. Patient awaiting EMS transport.

## 2021-01-20 NOTE — TOC Transition Note (Addendum)
Transition of Care [facility]) - CM/SW Discharge Note   Patient Details  Name: [name] MRN: [id number] Date of Birth: 06-25-1946  [facility] CM/SW Contact:  Magnus Ivan, LCSW Phone Number: 01/20/2021, 1:15 PM   Clinical Narrative:   [facility], Room 610B. Confirmed with Tammy. CSW updated MD, RN, patient's daughter in law. Asked RN to call report and MD to submit DC Summary. Medical Necessity Form and Face Sheet placed in Discharge Packet by patient chart. EMS transport called at 1:15, patient is 3rd on the list. Asked RN to call Shawn when EMS picks patient up per her request. No other needs identified prior to discharge.  4:15- Called to inquire status of [facility] transport. They reported they are on the way now to pick up patient from [facility]. CSW informed daughter in Chief of Staff and Therapist, sports.  Final next level of care: Skilled Nursing Facility Barriers to Discharge: Barriers Resolved   Patient Goals and CMS Choice Patient states their goals for this hospitalization and ongoing recovery are:: SNF CMS Medicare.gov Compare Post Acute Care list provided to:: Patient Choice offered to / list presented to : Patient  Discharge Placement              Patient chooses bed at: [facility] [place] Patient to be transferred to facility by: [facility] Name of family member notified: Raquel Sarna- daughter in law Patient and family notified of of transfer: 01/20/21  Discharge Plan and Services                                     Social Determinants of Health ([place]) Interventions     Readmission Risk Interventions Readmission Risk Prevention Plan 01/14/2021  PCP or Specialist Appt within 3-5 Days Complete  HRI or [place] Complete  Social Work Consult for [place] Planning/Counseling Complete  Palliative Care Screening Not Applicable  Medication Review Press photographer) Complete  Some recent data might be hidden

## 2021-01-20 NOTE — Progress Notes (Signed)
OT Cancellation Note  Patient Details Name: [name] MRN: [id number] DOB: 12/24/1945   Cancelled Treatment:    Reason Eval/Treat Not Completed: Patient at procedure or test/ unavailable  Pt with RN present in room prepping for transfer to 3B at this time. Will f/u for OT treatment as able. Thank you.  Gerrianne Scale, [place], OTR/L ascom [phone] 01/20/21, 9:22 AM

## 2021-01-20 NOTE — Discharge Summary (Signed)
Physician Discharge Summary  [name] NFA:[id number] DOB: April 25, 1946 DOA: 01/11/2021  PCP: Albina Billet, MD  Admit date: 01/11/2021 Discharge date: 01/20/2021  Discharge disposition: SNF   Recommendations for Outpatient Follow-Up:   Follow-up with podiatrist, Dr. Vickki Muff, in 1 week. Follow-up with orthopedic surgeon, Dr. Sabra Heck, in 1 to 2 weeks   Discharge Diagnosis:   Principal Problem:   Fracture of femoral neck, left, closed ([place]) Active Problems:   HLD (hyperlipidemia)   Hypothyroidism   Anxiety and depression   Hypomagnesemia   Chronic obstructive pulmonary disease (COPD) ([place])   PAD (peripheral artery disease) ([place])   Chronic respiratory failure with hypoxia (H[facility])   Hypokalemia    Discharge Condition: Stable.  Diet recommendation:  Diet Order            Diet - low sodium heart healthy           Diet regular Room service appropriate? Yes; Fluid consistency: Thin  Diet effective now                   Code Status: Full Code     Hospital Course:   [name] is a 75 year old femalewith medical history significant forPAD s/p mechanical thrombectomy and stent angioplasty of the right lower extremity and third right toe amputation, COPD, chronic respiratory failure with hypoxia on 2 L supplemental O2 via [place] at all times, CAD, hyperlipidemia, depression/anxiety, hard of hearing.  She presented to the hospital because of left hip pain after a mechanical fall at home.  Patient was recently hospitalized from 12/22/2020-12/31/2020 for gangrene of the third toe on the right foot. She underwent angiogram of RLE on 12/23/2020 with mechanical thrombectomy and catheter directed thrombolytic therapy to the right SFA, popliteal artery, and anterior tibial artery. S/p amputation of the third toe on the right foot 12/26/2020. She has been on aspirin and Eliquis. She was discharged to home with home health after she refused to go to SNF for rehab.  On this admission,  work-up revealed right basilar pneumonia (probable aspiration pneumonia) on the left femur fracture.  She was treated with analgesics, empiric IV antibiotics and IV fluids.  She was evaluated by orthopedic surgeon and she underwent left intramedullary nail intertrochanteric 01/14/2021.  She had hypokalemia hypomagnesemia that were repleted.  She developed acute blood loss anemia but she did not require any blood transfusion.  Her condition is improved and she is deemed stable for discharge.  PT and OT recommended discharge to SNF.      Medical Consultants:    Orthopedic surgeon  Podiatrist   Discharge Exam:    Vitals:   01/19/21 2027 01/19/21 2347 01/20/21 0423 01/20/21 0800  BP: 120/72 122/69 108/60 121/62  Pulse: (!) 102 (!) 102 (!) 103 (!) 110  Resp: 16 16 16 15   Temp: 98.4 F (36.9 C) 97.6 F (36.4 C) 98.2 F (36.8 C) 97.8 F (36.6 C)  TempSrc: Oral Oral Oral   SpO2: 100% 95% 100% 100%  Weight:      Height:         GEN: NAD SKIN: Warm and dry EYES: No pallor or icterus ENT: MMM CV: RRR PULM: CTA B ABD: soft, ND, NT, +BS CNS: AAO x 3, non focal EXT: Dressing on left hip surgical wound is intact.Right second gangrenous toe   The results of significant diagnostics from this hospitalization (including imaging, microbiology, ancillary and laboratory) are listed below for reference.     Procedures and Diagnostic Studies:  ECHOCARDIOGRAM COMPLETE  Result Date: 01/17/2021    ECHOCARDIOGRAM REPORT   Patient Name:   [name] Date of Exam: 01/17/2021 Medical Rec #:  [id number]   Height:       64.0 in Accession #:    [id number]  Weight:       120.0 lb Date of Birth:  1946/07/16   BSA:          1.575 m Patient Age:    89 years    BP:           122/71 mmHg Patient Gender: F           HR:           96 bpm. Exam Location:  [facility] Procedure: 2D Echo, Cardiac Doppler, Color Doppler and Strain Analysis Indications:     Atrial Fibrillation I48.91  History:         Patient has no  prior history of Echocardiogram examinations.                  CAD, COPD; Risk Factors:Hypertension.  Sonographer:     Sherrie Sport RDCS (AE) Referring Phys:  [id number] Sidney Ace Diagnosing Phys: Kathlyn Sacramento MD  Sonographer Comments: Global longitudinal strain was attempted. IMPRESSIONS  1. Left ventricular ejection fraction, by estimation, is 60 to 65%. The left ventricle has normal function. The left ventricle has no regional wall motion abnormalities. Left ventricular diastolic parameters are indeterminate. The average left ventricular global longitudinal strain is -8.2 %. The global longitudinal strain is abnormal.  2. Right ventricular systolic function is normal. The right ventricular size is normal. Tricuspid regurgitation signal is inadequate for assessing PA pressure.  3. The mitral valve is normal in structure. No evidence of mitral valve regurgitation. No evidence of mitral stenosis.  4. The aortic valve is normal in structure. Aortic valve regurgitation is not visualized. No aortic stenosis is present.  5. The inferior vena cava is normal in size with <50% respiratory variability, suggesting right atrial pressure of 8 mmHg. FINDINGS  Left Ventricle: Left ventricular ejection fraction, by estimation, is 60 to 65%. The left ventricle has normal function. The left ventricle has no regional wall motion abnormalities. The average left ventricular global longitudinal strain is -8.2 %. The  global longitudinal strain is abnormal. The left ventricular internal cavity size was normal in size. There is no left ventricular hypertrophy. Left ventricular diastolic parameters are indeterminate. Right Ventricle: The right ventricular size is normal. No increase in right ventricular wall thickness. Right ventricular systolic function is normal. Tricuspid regurgitation signal is inadequate for assessing PA pressure. Left Atrium: Left atrial size was normal in size. Right Atrium: Right atrial size was normal in  size. Pericardium: There is no evidence of pericardial effusion. Mitral Valve: The mitral valve is normal in structure. No evidence of mitral valve regurgitation. No evidence of mitral valve stenosis. Tricuspid Valve: The tricuspid valve is normal in structure. Tricuspid valve regurgitation is not demonstrated. No evidence of tricuspid stenosis. Aortic Valve: The aortic valve is normal in structure. Aortic valve regurgitation is not visualized. No aortic stenosis is present. Aortic valve mean gradient measures 3.0 mmHg. Aortic valve peak gradient measures 7.3 mmHg. Aortic valve area, by VTI measures 3.32 cm. Pulmonic Valve: The pulmonic valve was normal in structure. Pulmonic valve regurgitation is not visualized. No evidence of pulmonic stenosis. Aorta: The aortic root is normal in size and structure. Venous: The inferior vena cava is normal in size with less than  50% respiratory variability, suggesting right atrial pressure of 8 mmHg. IAS/Shunts: No atrial level shunt detected by color flow Doppler.  LEFT VENTRICLE PLAX 2D LVIDd:         3.88 cm  Diastology LVIDs:         2.23 cm  LV e' medial:    7.40 cm/s LV PW:         0.77 cm  LV E/e' medial:  8.5 LV IVS:        0.79 cm  LV e' lateral:   7.83 cm/s LVOT diam:     2.00 cm  LV E/e' lateral: 8.0 LV SV:         59 LV SV Index:   38       2D Longitudinal Strain LVOT Area:     3.14 cm 2D Strain GLS Avg:     -8.2 %                          3D Volume EF:                         3D EF:        61 %                         LV EDV:       101 ml                         LV ESV:       39 ml                         LV SV:        61 ml RIGHT VENTRICLE RV S prime:     12.50 cm/s TAPSE (M-mode): 3.8 cm LEFT ATRIUM           Index       RIGHT ATRIUM           Index LA diam:      3.60 cm 2.29 cm/m  RA Area:     14.60 cm LA Vol (A4C): 29.7 ml 18.86 ml/m RA Volume:   34.00 ml  21.59 ml/m  AORTIC VALVE                   PULMONIC VALVE AV Area (Vmax):    2.31 cm    PV Vmax:         0.54 m/s AV Area (Vmean):   2.81 cm    PV Peak grad:   1.2 mmHg AV Area (VTI):     3.32 cm    RVOT Peak grad: 3 mmHg AV Vmax:           135.00 cm/s AV Vmean:          75.100 cm/s AV VTI:            0.179 m AV Peak Grad:      7.3 mmHg AV Mean Grad:      3.0 mmHg LVOT Vmax:         99.20 cm/s LVOT Vmean:        67.100 cm/s LVOT VTI:          0.189 m LVOT/AV VTI ratio: 1.06  AORTA Ao Root diam: 3.10 cm MITRAL VALVE  TRICUSPID VALVE MV Area (PHT): 5.93 cm     TR Peak grad:   10.1 mmHg MV Decel Time: 128 msec     TR Vmax:        159.00 cm/s MV E velocity: 62.60 cm/s MV A velocity: 109.00 cm/s  SHUNTS MV E/A ratio:  0.57         Systemic VTI:  0.19 m                             Systemic Diam: 2.00 cm Kathlyn Sacramento MD Electronically signed by Kathlyn Sacramento MD Signature Date/Time: 01/17/2021/11:27:49 AM    Final      Labs:   Basic Metabolic Panel: Recent Labs  Lab 01/16/21 0630 01/17/21 0617 01/18/21 0454 01/19/21 0447 01/20/21 0633  NA 138 142 142 140 140  K 3.5 3.9 3.8 3.9 4.1  CL 102 104 107 106 108  CO2 26 30 30 28 26   GLUCOSE 121* 92 98 89 80  BUN 15 14 14 15 15   CREATININE 0.52 0.67 0.57 0.54 0.54  CALCIUM 8.0* 8.3* 8.3* 8.3* 8.5*   GFR Estimated Creatinine Clearance: 53.8 mL/min (by C-G formula based on SCr of 0.54 mg/dL). Liver Function Tests: No results for input(s): AST, ALT, ALKPHOS, BILITOT, PROT, ALBUMIN in the last 168 hours. No results for input(s): LIPASE, AMYLASE in the last 168 hours. No results for input(s): AMMONIA in the last 168 hours. Coagulation profile Recent Labs  Lab 01/14/21 0729  INR 1.2    CBC: Recent Labs  Lab 01/16/21 0630 01/17/21 0617 01/18/21 0454 01/19/21 0447 01/20/21 0633  WBC 8.2 6.9 7.3 8.5 7.4  NEUTROABS 6.1 4.0 4.6 5.1 4.2  HGB 7.7* 8.4* 7.5* 7.8* 8.0*  HCT 24.4* 27.1* 25.0* 24.7* 27.1*  MCV 90.0 92.2 92.3 92.2 94.4  PLT 373 384 394 403* 409*   Cardiac Enzymes: No results for input(s): CKTOTAL, CKMB, CKMBINDEX,  TROPONINI in the last 168 hours. BNP: Invalid input(s): POCBNP CBG: No results for input(s): GLUCAP in the last 168 hours. D-Dimer No results for input(s): DDIMER in the last 72 hours. Hgb A1c No results for input(s): HGBA1C in the last 72 hours. Lipid Profile No results for input(s): CHOL, HDL, LDLCALC, TRIG, CHOLHDL, LDLDIRECT in the last 72 hours. Thyroid function studies No results for input(s): TSH, T4TOTAL, T3FREE, THYROIDAB in the last 72 hours.  Invalid input(s): FREET3 Anemia work up No results for input(s): VITAMINB12, FOLATE, FERRITIN, TIBC, IRON, RETICCTPCT in the last 72 hours. Microbiology Recent Results (from the past 240 hour(s))  Resp Panel by RT-PCR (Flu A&B, Covid) Nasopharyngeal Swab     Status: None   Collection Time: 01/11/21  8:35 PM   Specimen: Nasopharyngeal Swab; Nasopharyngeal(NP) swabs in vial transport medium  Result Value Ref Range Status   SARS Coronavirus 2 by RT PCR NEGATIVE NEGATIVE Final    Comment: (NOTE) SARS-CoV-2 target nucleic acids are NOT DETECTED.  The SARS-CoV-2 RNA is generally detectable in upper respiratory specimens during the acute phase of infection. The lowest concentration of SARS-CoV-2 viral copies this assay can detect is 138 copies/mL. A negative result does not preclude SARS-Cov-2 infection and should not be used as the sole basis for treatment or other patient management decisions. A negative result may occur with  improper specimen collection/handling, submission of specimen other than nasopharyngeal swab, presence of viral mutation(s) within the areas targeted by this assay, and inadequate number of viral copies(<138 copies/mL). A negative  result must be combined with clinical observations, patient history, and epidemiological information. The expected result is Negative.  Fact Sheet for Patients:  EntrepreneurPulse.com.au  Fact Sheet for Healthcare Providers:   IncredibleEmployment.be  This test is no t yet approved or cleared by the [place] FDA and  has been authorized for detection and/or diagnosis of SARS-CoV-2 by FDA under an Emergency Use Authorization (EUA). This EUA will remain  in effect (meaning this test can be used) for the duration of the COVID-19 declaration under Section 564(b)(1) of the Act, 21 U.S.C.section 360bbb-3(b)(1), unless the authorization is terminated  or revoked sooner.       Influenza A by PCR NEGATIVE NEGATIVE Final   Influenza B by PCR NEGATIVE NEGATIVE Final    Comment: (NOTE) The Xpert Xpress SARS-CoV-2/FLU/RSV plus assay is intended as an aid in the diagnosis of influenza from Nasopharyngeal swab specimens and should not be used as a sole basis for treatment. Nasal washings and aspirates are unacceptable for Xpert Xpress SARS-CoV-2/FLU/RSV testing.  Fact Sheet for Patients: EntrepreneurPulse.com.au  Fact Sheet for Healthcare Providers: IncredibleEmployment.be  This test is not yet approved or cleared by the [place] FDA and has been authorized for detection and/or diagnosis of SARS-CoV-2 by FDA under an Emergency Use Authorization (EUA). This EUA will remain in effect (meaning this test can be used) for the duration of the COVID-19 declaration under Section 564(b)(1) of the Act, 21 U.S.C. section 360bbb-3(b)(1), unless the authorization is terminated or revoked.  Performed at [facility], [address] [place], [place] [address]   Resp Panel by RT-PCR (Flu A&B, Covid) Nasopharyngeal Swab     Status: None   Collection Time: 01/19/21  2:11 PM   Specimen: Nasopharyngeal Swab; Nasopharyngeal(NP) swabs in vial transport medium  Result Value Ref Range Status   SARS Coronavirus 2 by RT PCR NEGATIVE NEGATIVE Final    Comment: (NOTE) SARS-CoV-2 target nucleic acids are NOT DETECTED.  The SARS-CoV-2 RNA is generally detectable  in upper respiratory specimens during the acute phase of infection. The lowest concentration of SARS-CoV-2 viral copies this assay can detect is 138 copies/mL. A negative result does not preclude SARS-Cov-2 infection and should not be used as the sole basis for treatment or other patient management decisions. A negative result may occur with  improper specimen collection/handling, submission of specimen other than nasopharyngeal swab, presence of viral mutation(s) within the areas targeted by this assay, and inadequate number of viral copies(<138 copies/mL). A negative result must be combined with clinical observations, patient history, and epidemiological information. The expected result is Negative.  Fact Sheet for Patients:  EntrepreneurPulse.com.au  Fact Sheet for Healthcare Providers:  IncredibleEmployment.be  This test is no t yet approved or cleared by the [place] FDA and  has been authorized for detection and/or diagnosis of SARS-CoV-2 by FDA under an Emergency Use Authorization (EUA). This EUA will remain  in effect (meaning this test can be used) for the duration of the COVID-19 declaration under Section 564(b)(1) of the Act, 21 U.S.C.section 360bbb-3(b)(1), unless the authorization is terminated  or revoked sooner.       Influenza A by PCR NEGATIVE NEGATIVE Final   Influenza B by PCR NEGATIVE NEGATIVE Final    Comment: (NOTE) The Xpert Xpress SARS-CoV-2/FLU/RSV plus assay is intended as an aid in the diagnosis of influenza from Nasopharyngeal swab specimens and should not be used as a sole basis for treatment. Nasal washings and aspirates are unacceptable for Xpert Xpress SARS-CoV-2/FLU/RSV testing.  Fact Sheet for Patients: EntrepreneurPulse.com.au  Fact Sheet for Healthcare Providers: IncredibleEmployment.be  This test is not yet approved or cleared by the [place] FDA and has  been authorized for detection and/or diagnosis of SARS-CoV-2 by FDA under an Emergency Use Authorization (EUA). This EUA will remain in effect (meaning this test can be used) for the duration of the COVID-19 declaration under Section 564(b)(1) of the Act, 21 U.S.C. section 360bbb-3(b)(1), unless the authorization is terminated or revoked.  Performed at [facility], [address] [place], [place] [address]      Discharge Instructions:   Discharge Instructions    Diet - low sodium heart healthy   Complete by: As directed    Discharge wound care:   Complete by: As directed    Apply padded heel dressing (silicone border) to right heel 3 times per week.  Apply dry dressing to right third toe amputation site 3 times per week.  Apply Betadine paint to right second toe and evaluate daily.   Increase activity slowly   Complete by: As directed      Allergies as of 01/20/2021   No Known Allergies     Medication List    TAKE these medications   ALPRAZolam 0.5 MG tablet Commonly known as: XANAX Take 0.5 mg by mouth every 6 (six) hours as needed for anxiety.   amLODipine 5 MG tablet Commonly known as: NORVASC Take 1 tablet (5 mg total) by mouth daily.   apixaban 5 MG Tabs tablet Commonly known as: ELIQUIS Take 1 tablet (5 mg total) by mouth 2 (two) times daily.   aspirin EC 81 MG tablet Take 1 tablet (81 mg total) by mouth daily.   atorvastatin 10 MG tablet Commonly known as: Lipitor Take 1 tablet (10 mg total) by mouth daily.   buPROPion 150 MG 12 hr tablet Commonly known as: WELLBUTRIN SR Take 150 mg by mouth daily.   escitalopram 10 MG tablet Commonly known as: LEXAPRO Take 10 mg by mouth daily.   Fluticasone-Salmeterol 250-50 MCG/DOSE Aepb Commonly known as: Advair Diskus Inhale 1 puff into the lungs 2 (two) times daily.   levothyroxine 88 MCG tablet Commonly known as: SYNTHROID Take 88 mcg by mouth daily before breakfast.   montelukast 10 MG  tablet Commonly known as: SINGULAIR Take 10 mg by mouth daily.   nicotine 14 mg/24hr patch Commonly known as: NICODERM CQ - dosed in mg/24 hours Place 1 patch (14 mg total) onto the skin daily.   oxyCODONE 5 MG immediate release tablet Commonly known as: Oxy IR/ROXICODONE Take 1-2 tablets (5-10 mg total) by mouth every 6 (six) hours as needed for up to 10 days for severe pain.   tizanidine 2 MG capsule Commonly known as: ZANAFLEX Take 1 capsule (2 mg total) by mouth 3 (three) times daily as needed for muscle spasms.   traZODone 100 MG tablet Commonly known as: DESYREL Take 1 tablet (100 mg total) by mouth at bedtime. What changed: how much to take            Discharge Care Instructions  (From admission, onward)         Start     Ordered   01/20/21 0000  Discharge wound care:       Comments: Apply padded heel dressing (silicone border) to right heel 3 times per week.  Apply dry dressing to right third toe amputation site 3 times per week.  Apply Betadine paint to right second toe and evaluate daily.  01/20/21 0908          Contact information for follow-up providers    Samara Deist, DPM. Schedule an appointment as soon as possible for a visit in 1 week(s).   Specialty: Podiatry Contact information: [place] [address] [phone]            Contact information for after-discharge care    Destination    [facility] Preferred SNF .   Service: Skilled Nursing Contact information: [address] [place] [phone]                   Time coordinating discharge: 31 minutes  Signed:  Jennye Boroughs  [facility] 01/20/2021, 9:09 AM   Pager on [email or web address]. If 7PM-7AM, please contact night-coverage at [email or web address]

## 2021-01-31 ENCOUNTER — Ambulatory Visit (INDEPENDENT_AMBULATORY_CARE_PROVIDER_SITE_OTHER): Payer: Medicare HMO | Admitting: Nurse Practitioner

## 2021-01-31 ENCOUNTER — Encounter (INDEPENDENT_AMBULATORY_CARE_PROVIDER_SITE_OTHER): Payer: Medicare HMO

## 2021-02-06 NOTE — Progress Notes (Deleted)
No-show

## 2021-02-07 ENCOUNTER — Other Ambulatory Visit: Payer: Self-pay | Admitting: Pain Medicine

## 2021-02-07 ENCOUNTER — Encounter: Payer: Medicare HMO | Admitting: Pain Medicine

## 2021-02-07 DIAGNOSIS — S72002S Fracture of unspecified part of neck of left femur, sequela: Secondary | ICD-10-CM | POA: Insufficient documentation

## 2021-02-07 NOTE — Progress Notes (Signed)
According to the PMP, on 12/03/2020 she had a prescription for hydrocodone/APAP 5/325 #30, written by Kris Hartmann, AGNP, this was followed by another opioid prescription for oxycodone IR 5 mg tablets #30, written on 12/31/2020 by Sidney Ace, MD. On 01/21/2021 she had on oxycodone IR 10 mg tablet, #90, written by Dorice Lamas, MD. according to a review of the medical record, the patient suffered a left femur fracture for which a left intramedullary/intertrochanteric nailing had to be done by Dr. Elyn Aquas. Harlow Mares.  The operative procedure seems to have been done on 01/14/2021.

## 2021-02-15 ENCOUNTER — Telehealth: Payer: Self-pay | Admitting: Pain Medicine

## 2021-02-15 NOTE — Telephone Encounter (Signed)
Per Dr Dossie Arbour, you may schedule her for 1 virtual visit because of the inablility to come in because of broken hip.

## 2021-02-15 NOTE — Telephone Encounter (Signed)
Patient is calling about medication refills. She has broken her hip and cannot travel at this point. Wants to know if dr Dossie Arbour will do phone call until she can come back in to clinic. Please call patient and let her know status.

## 2021-02-16 ENCOUNTER — Telehealth: Payer: Self-pay | Admitting: *Deleted

## 2021-02-16 DIAGNOSIS — Z79891 Long term (current) use of opiate analgesic: Secondary | ICD-10-CM | POA: Insufficient documentation

## 2021-02-16 NOTE — Progress Notes (Signed)
Patient: [name]  Service Category: E/M  Provider: Gaspar Cola, MD  DOB: 03-28-1946  DOS: 02/17/2021  Location: Office  MRN: [id number]  Setting: Ambulatory outpatient  Referring Provider: Albina Billet, MD  Type: Established Patient  Specialty: Interventional Pain Management  PCP: Alison Billet, MD  Location: Remote location  Delivery: TeleHealth     Virtual Encounter - Pain Management PROVIDER NOTE: Information contained herein reflects review and annotations entered in association with encounter. Interpretation of such information and data should be left to medically-trained personnel. Information provided to patient can be located elsewhere in the medical record under "Patient Instructions". Document created using STT-dictation technology, any transcriptional errors that may result from process are unintentional.    Contact & Pharmacy Preferred: [phone] Home: [phone] (home) Mobile: [phone] (mobile) E-mail: No e-mail address on record  [id number] [place] [address] [id number]: [phone]: [phone]  Pre-screening  [name] offered "in-person" vs "virtual" encounter. She indicated preferring virtual for this encounter.   Reason COVID-19*  Social distancing based on CDC and AMA recommendations.   I contacted AGlendell Dockeron 02/17/2021 via telephone.      I clearly identified myself as FGaspar Cola MD. I verified that I was speaking with the correct person using two identifiers (Name: [name] and date of birth: 6Aug 12, 1947.  Consent I sought verbal advanced consent from AGlendell Dockerfor virtual visit interactions. I informed Ms. PSheenof possible security and privacy concerns, risks, and limitations associated with providing "not-in-person" medical evaluation and management services. I also informed Ms. POzierof the availability of "in-person" appointments. Finally, I informed her that there would be a  charge for the virtual visit and that she could be  personally, fully or partially, financially responsible for it. [name] understanding and agreed to proceed.   Historic Elements   [name] a 75y.o. year old, female patient evaluated today after our last contact on 02/15/2021. [name] has a past medical history of Anxiety, CAD (coronary artery disease), Carpal tunnel syndrome, Cataract, Complication of anesthesia, COPD (chronic obstructive pulmonary disease) ([place], CRP elevated (09/14/2015), Depression, Difficult intubation, Dyslipidemia, Dyspnea, Dysrhythmia, Elevated sedimentation rate (09/14/2015), Esophageal spasm, Gastrointestinal parasites, GERD (gastroesophageal reflux disease), Hiatal hernia, History of peptic ulcer disease, Hyperthyroidism, Hypothyroidism, Low magnesium levels (09/14/2015), Pelvic fracture ([place] ([place]), Reflux, Rotator cuff injury, and Stenosis, spinal, lumbar. She also  has a past surgical history that includes Cesarean section; Neck surgery; Cholecystectomy; Carpal tunnel release; Rotator cuff repair; Shoulder arthroscopy (12/07/2011); Shoulder surgery (12/07/2011); Intramedullary (im) nail intertrochanteric (Right, 10/01/2016); Fracture surgery; Appendectomy; Nose surgery; Back surgery; Cataract extraction w/PHACO (Right, 08/07/2017); Hip surgery; Cataract extraction w/PHACO (Left, 09/04/2017); Kyphoplasty (N/A, 08/20/2018); Lower Extremity Angiography (Right, 12/18/2019); Lower Extremity Angiography (Right, 12/02/2020); Lower Extremity Angiography (Right, 12/23/2020); Lower Extremity Angiography (Right, 12/24/2020); Amputation toe (Right, 12/26/2020); and Intramedullary (im) nail intertrochanteric (Left, 01/14/2021). Ms. PEchevarriahas a current medication list which includes the following prescription(s): alprazolam, amlodipine, apixaban, aspirin ec, atorvastatin, bupropion, escitalopram, fluticasone-salmeterol, levothyroxine, montelukast, nicotine, trazodone, oxycodone, and  tizanidine. She  reports that she has been smoking cigarettes. She has been smoking about 0.00 packs per day. She has never used smokeless tobacco. She reports that she does not drink alcohol and does not use drugs. Ms. PJordahlhas No Known Allergies.   HPI  Today, she is being contacted for medication management.  Closed left hip fracture on 01/11/2021. The operative procedure seems to have been done on 01/14/2021.  According to the PMP, on 12/03/2020 she had a prescription for hydrocodone/APAP 5/325 #30, written by [name], AGNP, this was followed by another opioid prescription for oxycodone IR 5 mg tablets #30, written on 12/31/2020 by Alison Ace, MD. On 01/21/2021 she had on oxycodone IR 10 mg tablet, #90, written by Alison Lamas, MD. according to a review of the medical record, the patient suffered a left femur fracture for which a left intramedullary/intertrochanteric nailing had to be done by Dr. Elyn Brown. [name].    Unfortunately, today's encounter lasted a lot longer than it should.  The patient decided to tell me in great detail the issues that she was having regarding her recent left hip fracture.  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  This amputation took place on 12/26/2020.  After that, she went home and fell breaking her left hip.  That require some further surgery.  During this entire ordeal she obtained pain medication from several physicians, which is understandable and accepted by our medication rules and regulations.  However, she did not follow our rules in the sense that she did not call us to notify us that she had been given the medication.  Even today, she attempted to tell me that she had not received any medication from any other physician and I had to actually remind her that I was looking at her PMP and that I could tell that she had received pain medication from several physicians,  in several locations, and that I could tell her exactly how many pills she had received, will counter medication, the date, the pharmacy where she had it filled, and even the CR # for the prescription.  When she realized that I had all of that information, then she informed me that she tried to get Dr. Hall Busing to prescribe her medicines and apparently there was some type of interaction that resulted in her being discharged from their practice.  Now she wants me to write for her type tizanidine and the oxycodone as she does not have a primary care physician.  Today I have reminded her that our rules and regulations state that she needs to have a primary care physician in order to be seen here.  I will give her some time for her to secure 1 but I have also reminded her that however that is we will need to take over her Zanaflex since we will not continue writing for that medicine.  She understood and accepted but today it it was extremely difficult to communicate with her since she takes over-the-counter birth sedation and will not stop to listen to me or allow me to participate in the conversation.  Eventually we got through and I have reminded her that I will be sending a prescription for only 30 days.  I have also reminded her that I will continue to write the prescriptions as I had been doing before and not in amounts higher than what I was writing for her before the injury.  She understood and accepted.  I also reminded her that this will be the very last tizanidine prescription that I will be writing for her.  She also understood and accepted  RTCB: 03/19/2021 Nonopioids transfer 11/03/2020: Zanaflex  Pharmacotherapy Assessment  Analgesic: Oxycodone IR 5 mg, 1  tab PO BID (10 mg/day of oxycodone) MME/day:19m/day.   Monitoring: [place] PMP: PDMP reviewed during this encounter.       Pharmacotherapy: No side-effects or adverse reactions reported. Compliance: No problems identified. Effectiveness: Clinically  acceptable. Plan: Refer to "POC".  UDS:  Summary  Date Value Ref Range Status  10/06/2019 Note  Final    Comment:    ==================================================================== [employer] [facility] (MW) ==================================================================== Test                             Result       Flag       Units Drug Present and Declared for Prescription Verification   Alprazolam                     574          EXPECTED   ng/mg creat   Alpha-hydroxyalprazolam        >1587        EXPECTED   ng/mg creat    Source of alprazolam is a scheduled prescription medication. Alpha-    hydroxyalprazolam is an expected metabolite of alprazolam.   Oxycodone                      4542         EXPECTED   ng/mg creat   Oxymorphone                    3763         EXPECTED   ng/mg creat   Noroxycodone                   >7937        EXPECTED   ng/mg creat   Noroxymorphone                 548          EXPECTED   ng/mg creat    Sources of oxycodone are scheduled prescription medications.    Oxymorphone, noroxycodone, and noroxymorphone are expected    metabolites of oxycodone. Oxymorphone is also available as a    scheduled prescription medication. ==================================================================== Test                      Result    Flag   Units      Ref Range   Creatinine              126              mg/dL      >=20 ==================================================================== Declared Medications:  The flagging and interpretation on this report are based on the  following declared medications.  Unexpected results may arise from  inaccuracies in the declared medications.  **Note: The testing scope of this panel includes these medications:  Alprazolam  Oxycodone  **Note: The testing scope of this panel does not include the  following reported medications:  Albuterol (Combivent)  Albuterol (Duoneb)  Amlodipine  Bupropion  Escitalopram  (Lexapro)  Fluticasone (Advair)  Ipratropium (Combivent)  Ipratropium (Duoneb)  Levothyroxine (Synthroid)  Magnesium  Montelukast (Singulair)  Salmeterol (Advair)  Tizanidine  Trazodone ==================================================================== For clinical consultation, please call [phone] ====================================================================     Laboratory Chemistry Profile   Renal Lab Results  Component Value Date   BUN 15 01/20/2021   CREATININE 0.54 01/20/2021   BCR 9 (L) 10/06/2019  GFRAA >60 12/18/2019   GFRNONAA >60 01/20/2021     Hepatic Lab Results  Component Value Date   AST 15 12/22/2020   ALT 10 12/22/2020   ALBUMIN 3.2 (L) 12/22/2020   ALKPHOS 76 12/22/2020     Electrolytes Lab Results  Component Value Date   NA 140 01/20/2021   K 4.1 01/20/2021   CL 108 01/20/2021   CALCIUM 8.5 (L) 01/20/2021   MG 2.3 01/12/2021   PHOS 4.3 01/01/2021     Bone Lab Results  Component Value Date   VD25OH 14.25 (L) 01/12/2021   25OHVITD1 15 (L) 10/06/2019   25OHVITD2 <1.0 10/06/2019   25OHVITD3 14 10/06/2019     Inflammation (CRP: Acute Phase) (ESR: Chronic Phase) Lab Results  Component Value Date   CRP 1.7 (H) 12/22/2020   ESRSEDRATE 42 (H) 12/22/2020   LATICACIDVEN 1.2 12/22/2020       Note: Above Lab results reviewed.  Imaging  ECHOCARDIOGRAM COMPLETE    ECHOCARDIOGRAM REPORT       Patient Name:   ANN T. Kings Date of Exam: 01/17/2021 Medical Rec #:  [id number]   Height:       64.0 in Accession #:    [id number]  Weight:       120.0 lb Date of Birth:  13-Jan-1946   BSA:          1.575 m Patient Age:    46 years    BP:           122/71 mmHg Patient Gender: F           HR:           96 bpm. Exam Location:  [facility]  Procedure: 2D Echo, Cardiac Doppler, Color Doppler and Strain Analysis  Indications:     Atrial Fibrillation I48.91   History:         Patient has no prior history of Echocardiogram examinations.                   CAD, COPD; Risk Factors:Hypertension.   Sonographer:     Sherrie Sport RDCS (AE) Referring Phys:  [id number] [name] Diagnosing Phys: Kathlyn Sacramento MD    Sonographer Comments: Global longitudinal strain was attempted. IMPRESSIONS   1. Left ventricular ejection fraction, by estimation, is 60 to 65%. The left ventricle has normal function. The left ventricle has no regional wall motion abnormalities. Left ventricular diastolic parameters are indeterminate. The average left  ventricular global longitudinal strain is -8.2 %. The global longitudinal strain is abnormal.  2. Right ventricular systolic function is normal. The right ventricular size is normal. Tricuspid regurgitation signal is inadequate for assessing PA pressure.  3. The mitral valve is normal in structure. No evidence of mitral valve regurgitation. No evidence of mitral stenosis.  4. The aortic valve is normal in structure. Aortic valve regurgitation is not visualized. No aortic stenosis is present.  5. The inferior vena cava is normal in size with <50% respiratory variability, suggesting right atrial pressure of 8 mmHg.  FINDINGS  Left Ventricle: Left ventricular ejection fraction, by estimation, is 60 to 65%. The left ventricle has normal function. The left ventricle has no regional wall motion abnormalities. The average left ventricular global longitudinal strain is -8.2 %. The  global longitudinal strain is abnormal. The left ventricular internal cavity size was normal in size. There is no left ventricular hypertrophy. Left ventricular diastolic parameters are indeterminate.  Right Ventricle: The right ventricular size is normal. No  increase in right ventricular wall thickness. Right ventricular systolic function is normal. Tricuspid regurgitation signal is inadequate for assessing PA pressure.  Left Atrium: Left atrial size was normal in size.  Right Atrium: Right atrial size was normal in  size.  Pericardium: There is no evidence of pericardial effusion.  Mitral Valve: The mitral valve is normal in structure. No evidence of mitral valve regurgitation. No evidence of mitral valve stenosis.  Tricuspid Valve: The tricuspid valve is normal in structure. Tricuspid valve regurgitation is not demonstrated. No evidence of tricuspid stenosis.  Aortic Valve: The aortic valve is normal in structure. Aortic valve regurgitation is not visualized. No aortic stenosis is present. Aortic valve mean gradient measures 3.0 mmHg. Aortic valve peak gradient measures 7.3 mmHg. Aortic valve area, by VTI  measures 3.32 cm.  Pulmonic Valve: The pulmonic valve was normal in structure. Pulmonic valve regurgitation is not visualized. No evidence of pulmonic stenosis.  Aorta: The aortic root is normal in size and structure.  Venous: The inferior vena cava is normal in size with less than 50% respiratory variability, suggesting right atrial pressure of 8 mmHg.  IAS/Shunts: No atrial level shunt detected by color flow Doppler.    LEFT VENTRICLE PLAX 2D LVIDd:         3.88 cm  Diastology LVIDs:         2.23 cm  LV e' medial:    7.40 cm/s LV PW:         0.77 cm  LV E/e' medial:  8.5 LV IVS:        0.79 cm  LV e' lateral:   7.83 cm/s LVOT diam:     2.00 cm  LV E/e' lateral: 8.0 LV SV:         59 LV SV Index:   38       2D Longitudinal Strain LVOT Area:     3.14 cm 2D Strain GLS Avg:     -8.2 %                           3D Volume EF:                         3D EF:        61 %                         LV EDV:       101 ml                         LV ESV:       39 ml                         LV SV:        61 ml  RIGHT VENTRICLE RV S prime:     12.50 cm/s TAPSE (M-mode): 3.8 cm  LEFT ATRIUM           Index       RIGHT ATRIUM           Index LA diam:      3.60 cm 2.29 cm/m  RA Area:     14.60 cm LA Vol (A4C): 29.7 ml 18.86 ml/m RA Volume:   34.00 ml  21.59 ml/m  AORTIC VALVE  PULMONIC VALVE AV Area (Vmax):    2.31 cm    PV Vmax:        0.54 m/s AV Area (Vmean):   2.81 cm    PV Peak grad:   1.2 mmHg AV Area (VTI):     3.32 cm    RVOT Peak grad: 3 mmHg AV Vmax:           135.00 cm/s AV Vmean:          75.100 cm/s AV VTI:            0.179 m AV Peak Grad:      7.3 mmHg AV Mean Grad:      3.0 mmHg LVOT Vmax:         99.20 cm/s LVOT Vmean:        67.100 cm/s LVOT VTI:          0.189 m LVOT/AV VTI ratio: 1.06   AORTA Ao Root diam: 3.10 cm  MITRAL VALVE                TRICUSPID VALVE MV Area (PHT): 5.93 cm     TR Peak grad:   10.1 mmHg MV Decel Time: 128 msec     TR Vmax:        159.00 cm/s MV E velocity: 62.60 cm/s MV A velocity: 109.00 cm/s  SHUNTS MV E/A ratio:  0.57         Systemic VTI:  0.19 m                             Systemic Diam: 2.00 cm  Kathlyn Sacramento MD Electronically signed by Kathlyn Sacramento MD Signature Date/Time: 01/17/2021/11:27:49 AM      Final    Assessment  The primary encounter diagnosis was Chronic pain syndrome. 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  Plan of Care  Problem-specific:  No problem-specific Assessment & Plan notes found for this encounter.  [name] has a current medication list which includes the following long-term medication(s): amlodipine, apixaban, atorvastatin, bupropion, escitalopram, fluticasone-salmeterol, levothyroxine, trazodone, oxycodone, and tizanidine.  Pharmacotherapy (Medications  Ordered): Meds ordered this encounter  Medications  . oxyCODONE (OXY IR/ROXICODONE) 5 MG immediate release tablet    Sig: Take 1 tablet (5 mg total) by mouth 2 (two) times daily as needed for severe pain. Must last 30 days    Dispense:  60 tablet    Refill:  0    Not a duplicate. Do NOT delete! Chronic Pain: STOP Act NOT applicable. Fill 1 day early if closed on refill date. Avoid benzodiazepines within 8 hours of opioids. Do not send refill requests.  . tizanidine (ZANAFLEX) 2 MG capsule    Sig: Take 1 capsule (2 mg total) by mouth 3 (three) times daily as needed for muscle spasms.    Dispense:  90 capsule    Refill:  2    Fill one day early if pharmacy is closed on scheduled refill date. May substitute for generic if available.   Orders:  No orders of the defined types were placed in this encounter.  Follow-up plan:   Return in about 1 month (around 03/19/2021) for (F2F), (MM).      Interventional management options: Considering:   Possible right SI joint RFA Possible right lumbar facet RFA Diagnostic bilateral cervical facetblock  Possible bilateral cervical facet RFA  Diagnostic right CESI  Diagnostic bilateral IA shoulder injection  Diagnostic bilateral suprascapularNB  Possible bilateral suprascapular nerve RFA  Diagnostic left L3-4 TFESI  Diagnostic L3-4 vs L4-5 LESI    Palliative PRN treatment(s):   Diagnostic rightlumbarfacet + right SI joint block#2 Palliative right SI joint block Palliative right lumbar facet block    Recent Visits No visits were found meeting these conditions. Showing recent visits within past 90 days and meeting all other requirements Today's Visits Date Type Provider Dept  02/17/21 Telemedicine Milinda Pointer, MD [facility]  Showing today's visits and meeting all other requirements Future Appointments No visits were found meeting these conditions. Showing future appointments within next 90 days and meeting all other  requirements  I discussed the assessment and treatment plan with the patient. The patient was provided an opportunity to ask questions and all were answered. The patient agreed with the plan and demonstrated an understanding of the instructions.  Patient advised to call back or seek an in-person evaluation if the symptoms or condition worsens.  Duration of encounter: 25 minutes.  Note by: Alison Cola, MD Date: 02/17/2021; Time: 2:08 PM

## 2021-02-16 NOTE — Telephone Encounter (Signed)
Called re; VV, no answer and no voicemail capability.

## 2021-02-17 ENCOUNTER — Ambulatory Visit: Payer: Medicare HMO | Attending: Pain Medicine | Admitting: Pain Medicine

## 2021-02-17 ENCOUNTER — Other Ambulatory Visit: Payer: Self-pay

## 2021-02-17 DIAGNOSIS — G8929 Other chronic pain: Secondary | ICD-10-CM

## 2021-02-17 DIAGNOSIS — M961 Postlaminectomy syndrome, not elsewhere classified: Secondary | ICD-10-CM

## 2021-02-17 DIAGNOSIS — M25552 Pain in left hip: Secondary | ICD-10-CM | POA: Insufficient documentation

## 2021-02-17 DIAGNOSIS — M545 Low back pain, unspecified: Secondary | ICD-10-CM | POA: Diagnosis not present

## 2021-02-17 DIAGNOSIS — M7918 Myalgia, other site: Secondary | ICD-10-CM

## 2021-02-17 DIAGNOSIS — M25511 Pain in right shoulder: Secondary | ICD-10-CM

## 2021-02-17 DIAGNOSIS — S72002S Fracture of unspecified part of neck of left femur, sequela: Secondary | ICD-10-CM | POA: Diagnosis not present

## 2021-02-17 DIAGNOSIS — G894 Chronic pain syndrome: Secondary | ICD-10-CM | POA: Diagnosis not present

## 2021-02-17 DIAGNOSIS — F1120 Opioid dependence, uncomplicated: Secondary | ICD-10-CM

## 2021-02-17 DIAGNOSIS — M542 Cervicalgia: Secondary | ICD-10-CM

## 2021-02-17 DIAGNOSIS — M25512 Pain in left shoulder: Secondary | ICD-10-CM

## 2021-02-17 DIAGNOSIS — Z79891 Long term (current) use of opiate analgesic: Secondary | ICD-10-CM

## 2021-02-17 DIAGNOSIS — Z79899 Other long term (current) drug therapy: Secondary | ICD-10-CM

## 2021-02-17 MED ORDER — TIZANIDINE HCL 2 MG PO CAPS
2.0000 mg | ORAL_CAPSULE | Freq: Three times a day (TID) | ORAL | 2 refills | Status: DC | PRN
Start: 2021-02-17 — End: 2021-11-25

## 2021-02-17 MED ORDER — OXYCODONE HCL 5 MG PO TABS
5.0000 mg | ORAL_TABLET | Freq: Two times a day (BID) | ORAL | 0 refills | Status: DC | PRN
Start: 2021-02-17 — End: 2021-07-12

## 2021-03-03 ENCOUNTER — Other Ambulatory Visit (INDEPENDENT_AMBULATORY_CARE_PROVIDER_SITE_OTHER): Payer: Self-pay | Admitting: Vascular Surgery

## 2021-03-17 ENCOUNTER — Encounter: Payer: Medicare HMO | Admitting: Pain Medicine

## 2021-03-21 ENCOUNTER — Emergency Department
Admission: EM | Admit: 2021-03-21 | Discharge: 2021-03-21 | Disposition: A | Discharge status: Home / Self Care | Payer: Medicare HMO | Attending: Emergency Medicine | Admitting: Emergency Medicine

## 2021-03-21 ENCOUNTER — Emergency Department: Payer: Medicare HMO

## 2021-03-21 ENCOUNTER — Other Ambulatory Visit: Payer: Self-pay

## 2021-03-21 ENCOUNTER — Encounter: Payer: Self-pay | Admitting: Emergency Medicine

## 2021-03-21 DIAGNOSIS — Z7982 Long term (current) use of aspirin: Secondary | ICD-10-CM | POA: Diagnosis not present

## 2021-03-21 DIAGNOSIS — R0602 Shortness of breath: Secondary | ICD-10-CM | POA: Diagnosis present

## 2021-03-21 DIAGNOSIS — F17210 Nicotine dependence, cigarettes, uncomplicated: Secondary | ICD-10-CM | POA: Insufficient documentation

## 2021-03-21 DIAGNOSIS — Z79899 Other long term (current) drug therapy: Secondary | ICD-10-CM | POA: Insufficient documentation

## 2021-03-21 DIAGNOSIS — E039 Hypothyroidism, unspecified: Secondary | ICD-10-CM | POA: Insufficient documentation

## 2021-03-21 DIAGNOSIS — J441 Chronic obstructive pulmonary disease with (acute) exacerbation: Secondary | ICD-10-CM | POA: Insufficient documentation

## 2021-03-21 DIAGNOSIS — I2510 Atherosclerotic heart disease of native coronary artery without angina pectoris: Secondary | ICD-10-CM | POA: Diagnosis not present

## 2021-03-21 DIAGNOSIS — Z20822 Contact with and (suspected) exposure to covid-19: Secondary | ICD-10-CM | POA: Insufficient documentation

## 2021-03-21 DIAGNOSIS — R0902 Hypoxemia: Secondary | ICD-10-CM

## 2021-03-21 LAB — TROPONIN I (HIGH SENSITIVITY)
Troponin I (High Sensitivity): 6 ng/L (ref <18)
Troponin I (High Sensitivity): 5 ng/L (ref <18)

## 2021-03-21 LAB — CBC WITH DIFFERENTIAL/PLATELET
Abs Immature Granulocytes: 0.04 10*3/uL (ref 0.00–0.07)
Basophils Absolute: 0.1 10*3/uL (ref 0.0–0.1)
Basophils Relative: 1 %
Eosinophils Absolute: 0.1 10*3/uL (ref 0.0–0.5)
Eosinophils Relative: 1 %
HCT: 30.9 % — ABNORMAL LOW (ref 36.0–46.0)
Hemoglobin: 9.1 g/dL — ABNORMAL LOW (ref 12.0–15.0)
Immature Granulocytes: 1 %
Lymphocytes Relative: 28 %
Lymphs Abs: 2.3 10*3/uL (ref 0.7–4.0)
MCH: 23.2 pg — ABNORMAL LOW (ref 26.0–34.0)
MCHC: 29.4 g/dL — ABNORMAL LOW (ref 30.0–36.0)
MCV: 78.6 fL — ABNORMAL LOW (ref 80.0–100.0)
Monocytes Absolute: 0.3 10*3/uL (ref 0.1–1.0)
Monocytes Relative: 4 %
Neutro Abs: 5.6 10*3/uL (ref 1.7–7.7)
Neutrophils Relative %: 65 %
Platelets: 440 10*3/uL — ABNORMAL HIGH (ref 150–400)
RBC: 3.93 MIL/uL (ref 3.87–5.11)
RDW: 14.9 % (ref 11.5–15.5)
WBC: 8.4 10*3/uL (ref 4.0–10.5)
nRBC: 0 % (ref 0.0–0.2)

## 2021-03-21 LAB — COMPREHENSIVE METABOLIC PANEL
ALT: 12 U/L (ref 0–44)
AST: 19 U/L (ref 15–41)
Albumin: 3.2 g/dL — ABNORMAL LOW (ref 3.5–5.0)
Alkaline Phosphatase: 88 U/L (ref 38–126)
Anion gap: 9 (ref 5–15)
BUN: 14 mg/dL (ref 8–23)
CO2: 26 mmol/L (ref 22–32)
Calcium: 8.8 mg/dL — ABNORMAL LOW (ref 8.9–10.3)
Chloride: 105 mmol/L (ref 98–111)
Creatinine, Ser: 0.66 mg/dL (ref 0.44–1.00)
GFR, Estimated: >60 mL/min (ref >60)
Glucose, Bld: 95 mg/dL (ref 70–99)
Potassium: 3.7 mmol/L (ref 3.5–5.1)
Sodium: 140 mmol/L (ref 135–145)
Total Bilirubin: 0.8 mg/dL (ref 0.3–1.2)
Total Protein: 7 g/dL (ref 6.5–8.1)

## 2021-03-21 LAB — RESP PANEL BY RT-PCR (FLU A&B, COVID) ARPGX2
Influenza A by PCR: NEGATIVE
Influenza B by PCR: NEGATIVE
SARS Coronavirus 2 by RT PCR: NEGATIVE

## 2021-03-21 MED ORDER — DOXYCYCLINE HYCLATE 100 MG PO TABS
100.0000 mg | ORAL_TABLET | Freq: Once | ORAL | Status: AC
  Administered 2021-03-21: 100 mg via ORAL
  Filled 2021-03-21: qty 1

## 2021-03-21 MED ORDER — PREDNISONE 50 MG PO TABS: 50.0000 mg | ORAL_TABLET | Freq: Every day | ORAL | 0 refills | Status: AC

## 2021-03-21 MED ORDER — DOXYCYCLINE HYCLATE 100 MG PO TABS: 100.0000 mg | ORAL_TABLET | Freq: Two times a day (BID) | ORAL | 0 refills | Status: AC

## 2021-03-21 MED ORDER — ALPRAZOLAM 0.5 MG PO TABS
0.5000 mg | ORAL_TABLET | Freq: Once | ORAL | Status: AC
  Administered 2021-03-21: 0.5 mg via ORAL
  Filled 2021-03-21: qty 1

## 2021-03-21 MED ORDER — ALBUTEROL SULFATE HFA 108 (90 BASE) MCG/ACT IN AERS: 2.0000 | INHALATION_SPRAY | Freq: Four times a day (QID) | RESPIRATORY_TRACT | 2 refills | Status: DC | PRN

## 2021-03-21 MED ORDER — METHYLPREDNISOLONE SODIUM SUCC 125 MG IJ SOLR
125.0000 mg | Freq: Once | INTRAMUSCULAR | Status: AC
  Administered 2021-03-21: 125 mg via INTRAVENOUS
  Filled 2021-03-21: qty 2

## 2021-03-21 MED ORDER — IPRATROPIUM-ALBUTEROL 0.5-2.5 (3) MG/3ML IN SOLN
3.0000 mL | Freq: Once | RESPIRATORY_TRACT | Status: AC
  Administered 2021-03-21: 3 mL via RESPIRATORY_TRACT
  Filled 2021-03-21: qty 3

## 2021-03-21 NOTE — ED Triage Notes (Signed)
Pt to ER via EMS from home with c/o increasing SHOB.  Pt had surgery approximately 6 weeks ago and was in rehab.  After being released from Rehab, pt has not had Albuterol or anxiety medications for several weeks.  Per EMS pt was tripoding and speaking in 1 work sentences.  Pt given duoneb and albuterol and on arrival speaking in nonstop complete sentences.

## 2021-03-21 NOTE — ED Provider Notes (Signed)
[facility] Emergency Department Provider Note ____________________________________________   Event Date/Time   First MD Initiated Contact with Patient 03/21/21 [phone]     (approximate)  I have reviewed the triage vital signs and the nursing notes.  HISTORY  Chief Complaint Shortness of Breath   HPI [name] is a 75 y.o. femalewho presents to the ED for evaluation of SOB.   Chart review indicates history of CAD, COPD on 2 L home O2, Recent medical admission for hip fracture 2 months ago, surgical repair.  PAD on Eliquis.  Discharged to a SNF, and went home after that.  Patient reports being home for the past 1.5 weeks.  She reports not having her rescue albuterol inhaler at home, and she reports not using her Advair every day due to not knowing that she was brought to take it every day.  She reports increasing shortness of breath and increased cough over the same timeframe.  Denies increased sputum production, denies fever, syncopal episodes or falls.  She was reported change in quality to her sputum and change in color. she was report increased anxiety due to someone stealing her alprazolam and she requests that I prescribe this for her.  EMS reports picking up the patient from home today where she was tripoding, short of breath and hypoxic to the low 80s on her home 2 L O2.  Improved with 4 L O2 and they provided duo nebs with improvement of her clinical picture.  Patient reports feeling better now.   Past Medical History:  Diagnosis Date  . Anxiety   . CAD (coronary artery disease)   . Carpal tunnel syndrome   . Cataract   . Complication of anesthesia    has woken up at times  . COPD (chronic obstructive pulmonary disease) ([place])   . CRP elevated 09/14/2015  . Depression   . Difficult intubation   . Dyslipidemia   . Dyspnea    DOE  . Dysrhythmia    hx palpatations  . Elevated sedimentation rate 09/14/2015  . Esophageal spasm   .  Gastrointestinal parasites   . GERD (gastroesophageal reflux disease)   . Hiatal hernia   . History of peptic ulcer disease   . Hyperthyroidism   . Hypothyroidism   . Low magnesium levels 09/14/2015  . Pelvic fracture ([place]) 2008   fall from riding a horse  . Reflux   . Rotator cuff injury   . Stenosis, spinal, lumbar     Patient Active Problem List   Diagnosis Date Noted  . Chronic hip pain (Left) 02/17/2021  . Hip pain, acute (Left) 02/17/2021  . Chronic use of opiate for therapeutic purpose 02/16/2021  . Closed fracture of hip, sequela (Left) 02/07/2021  . Fracture of femoral neck, left, closed ([place]) 01/11/2021  . Chronic respiratory failure with hypoxia ([place]) 01/11/2021  . Hypokalemia 01/11/2021  . 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 PAD (peripheral artery disease) ([place]) 12/22/2020  . Encounter for long-term opiate analgesic use 11/11/2020  . Dry gangrene ([place]) of middle toe, right foot 11/11/2020  . Foot pain, right 11/11/2020  . Hard of hearing 11/11/2020  . Atherosclerosis of native arteries of the extremities with gangrene ([place]) 12/09/2019  . Vitamin D deficiency 11/12/2019  . Pharmacologic therapy 06/04/2019  . Disorder of skeletal system 06/04/2019  . Problems influencing health status 06/04/2019  . Sepsis ([place]) 10/22/2018  . HCAP (healthcare-associated pneumonia) 10/22/2018  . Compression fracture of L3 vertebra (  [place]) 08/19/2018  . Acute UTI 08/18/2018  . Chronic hip pain (Bilateral) 12/31/2017  . Neurogenic pain 08/29/2017  . Chronic low back pain (1ry area of Pain) (Right) w/o sciatica 08/29/2017  . Chronic sacroiliac joint pain (Right) 06/05/2017  . Vitamin D insufficiency 03/12/2017  . Right hip pain 02/20/2017  . Intertrochanteric fracture of right hip, sequela 02/20/2017  . B12 deficiency 02/05/2017  . Pressure injury of skin 10/02/2016  . Closed displaced intertrochanteric fracture of right femur ([place]) 10/02/2016  . Hip  fracture ([place]) 10/01/2016  . Cervical facet hypertrophy (Bilateral) 05/27/2016  . History of shoulder surgery 5 (Right) 05/27/2016  . Lumbar foraminal stenosis (L3-4) (Left) 05/27/2016  . 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 Lumbar facet hypertrophy (Bilateral) 05/27/2016  . Lumbar facet syndrome (Right) 05/27/2016  . Lumbar grade 1 Anterolisthesis of L3 over L4 05/27/2016  . Chronic shoulder pain (Bilateral) (status post multiple surgeries) (R>L) 12/08/2015  . Substance use disorder Risk: High 09/14/2015  . Chronic pain syndrome 09/14/2015  . Cervical spondylosis 09/14/2015  . Chronic neck pain (2ry area of Pain) (Bilateral) (R>L) 09/14/2015  . 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 Cervical facet syndrome (Bilateral) (R>L) 09/14/2015  . Cervical myofascial pain syndrome 09/14/2015  . Lumbar spondylosis 09/14/2015  . Chronic shoulder impingement syndrome (Right) 09/14/2015  . Hypomagnesemia 09/14/2015  . CRP elevated 09/14/2015  . Elevated sedimentation rate 09/14/2015  . Chronic obstructive pulmonary disease (COPD) ([place]) 09/14/2015  . Nicotine dependence 09/14/2015  . Chronic shoulder pain (Right) 09/14/2015  . Abnormal nerve conduction studies 09/14/2015  . Encounter for therapeutic drug level monitoring 09/09/2015  . Long term current use of opiate analgesic 09/09/2015  . Long term prescription opiate use 09/09/2015  . Uncomplicated opioid dependence ([place]) 09/09/2015  . Opiate use 09/09/2015  . Anxiety and depression 07/11/2012  . Tobacco use disorder 05/27/2011  . Fatigue 05/27/2011  . Hypothyroidism 11/25/2010  . HLD (hyperlipidemia) 08/24/2010  . Tachycardia 08/24/2010  . DYSPNEA 08/24/2010    Past Surgical History:  Procedure Laterality Date  . AMPUTATION TOE Right 12/26/2020   Procedure: AMPUTATION TOE-3rd Toes;  Surgeon: Samara Deist, DPM;  Location: [facility] ORS;  Service: Podiatry;  Laterality:  Right;  . APPENDECTOMY    . BACK SURGERY     CERVICAL FUSION  . CARPAL TUNNEL RELEASE    . CATARACT EXTRACTION W/PHACO Right 08/07/2017   Procedure: CATARACT EXTRACTION PHACO AND INTRAOCULAR LENS PLACEMENT (IOC);  Surgeon: Birder Robson, MD;  Location: [facility];  Service: Ophthalmology;  Laterality: Right;  [place] 00:52.0 AP% 16.8 CDE 8.74 Fluid Pack Lot # [id number]  . CATARACT EXTRACTION W/PHACO Left 09/04/2017   Procedure: CATARACT EXTRACTION PHACO AND INTRAOCULAR LENS PLACEMENT (IOC);  Surgeon: Birder Robson, MD;  Location: [facility];  Service: Ophthalmology;  Laterality: Left;  [place] 00:34 AP% 17.0 CDE 5.80 Fluid pack lot # [id number]  . CESAREAN SECTION    . CHOLECYSTECTOMY    . FRACTURE SURGERY    . HIP SURGERY    . INTRAMEDULLARY (IM) NAIL INTERTROCHANTERIC Right 10/01/2016   Procedure: INTRAMEDULLARY (IM) NAIL INTERTROCHANTRIC;  Surgeon: Corky Mull, MD;  Location: [facility];  Service: Orthopedics;  Laterality: Right;  . INTRAMEDULLARY (IM) NAIL INTERTROCHANTERIC Left 01/14/2021   Procedure: INTRAMEDULLARY (IM) NAIL INTERTROCHANTRIC;  Surgeon: Lovell Sheehan, MD;  Location: [facility];  Service: Orthopedics;  Laterality: Left;  . KYPHOPLASTY N/A 08/20/2018   Procedure: [id number];  Surgeon: Hessie Knows, MD;  Location: [facility];  Service: Orthopedics;  Laterality: N/A;  . LOWER EXTREMITY ANGIOGRAPHY Right 12/18/2019   Procedure: LOWER EXTREMITY ANGIOGRAPHY;  Surgeon: Algernon Huxley, MD;  Location: [place] [facility];  Service: Cardiovascular;  Laterality: Right;  . LOWER EXTREMITY ANGIOGRAPHY Right 12/02/2020   Procedure: LOWER EXTREMITY ANGIOGRAPHY;  Surgeon: Algernon Huxley, MD;  Location: [place] [facility];  Service: Cardiovascular;  Laterality: Right;  . LOWER EXTREMITY ANGIOGRAPHY Right 12/23/2020   Procedure: Lower Extremity Angiography;  Surgeon: Algernon Huxley, MD;  Location: [place] [facility];  Service: Cardiovascular;  Laterality: Right;  . LOWER EXTREMITY ANGIOGRAPHY  Right 12/24/2020   Procedure: Lower Extremity Angiography;  Surgeon: Algernon Huxley, MD;  Location: [place] [facility];  Service: Cardiovascular;  Laterality: Right;  . NECK SURGERY    . NOSE SURGERY    . ROTATOR CUFF REPAIR     x2  . SHOULDER ARTHROSCOPY  12/07/2011   Procedure: ARTHROSCOPY SHOULDER;  Surgeon: Ninetta Lights, MD;  Location: [place];  Service: Orthopedics;  Laterality: Right;  Debridement Partial Cuff Tear, Release Coracoacromial Ligament  . SHOULDER SURGERY  12/07/2011   right    Prior to Admission medications   Medication Sig Start Date End Date Taking? Authorizing Provider  albuterol (VENTOLIN HFA) 108 (90 Base) MCG/ACT inhaler Inhale 2 puffs into the lungs every 6 (six) hours as needed for wheezing or shortness of breath. 03/21/21  Yes Vladimir Crofts, MD  ALPRAZolam Duanne Moron) 0.5 MG tablet Take 1 tablet (0.5 mg total) by mouth every 6 (six) hours as needed for anxiety. 01/20/21  Yes Jennye Boroughs, MD  amLODipine (NORVASC) 5 MG tablet Take 1 tablet (5 mg total) by mouth daily. 08/22/18  Yes Gladstone Lighter, MD  apixaban (ELIQUIS) 5 MG TABS tablet Take 1 tablet (5 mg total) by mouth 2 (two) times daily. 12/31/20 03/01/21 Yes Sreenath, Sudheer B, MD  aspirin EC 81 MG tablet Take 1 tablet (81 mg total) by mouth daily. 12/02/20  Yes Dew, Erskine Squibb, MD  atorvastatin (LIPITOR) 10 MG tablet Take 1 tablet (10 mg total) by mouth daily. 12/18/19 12/17/20 Yes Algernon Huxley, MD  buPROPion [facility] SR) 150 MG 12 hr tablet Take 150 mg by mouth daily.    Yes [provider]  COMBIVENT RESPIMAT 20-100 MCG/ACT AERS respimat Inhale 1-2 puffs into the lungs 4 (four) times daily. 03/07/21  Yes [provider]  doxycycline (VIBRA-TABS) 100 MG tablet Take 1 tablet (100 mg total) by mouth 2 (two) times daily for 7 days. 03/21/21 03/28/21 Yes Vladimir Crofts, MD  escitalopram (LEXAPRO) 10 MG tablet Take 10 mg by mouth daily.  10/15/13  Yes [provider]   Fluticasone-Salmeterol (ADVAIR DISKUS) 250-50 MCG/DOSE AEPB Inhale 1 puff into the lungs 2 (two) times daily. 12/31/20 01/30/21 Yes Sreenath, Sudheer B, MD  levothyroxine (SYNTHROID, LEVOTHROID) 88 MCG tablet Take 88 mcg by mouth daily before breakfast.    Yes [provider]  montelukast (SINGULAIR) 10 MG tablet Take 10 mg by mouth daily.   Yes [provider]  predniSONE (DELTASONE) 50 MG tablet Take 1 tablet (50 mg total) by mouth daily for 4 days. 03/21/21 03/25/21 Yes Vladimir Crofts, MD  tizanidine (ZANAFLEX) 2 MG capsule Take 1 capsule (2 mg total) by mouth 3 (three) times daily as needed for muscle spasms. 02/17/21 05/18/21 Yes Milinda Pointer, MD  traZODone (DESYREL) 100 MG tablet Take 1 tablet (100 mg total) by mouth at bedtime. Patient taking differently:  Take 200 mg by mouth at bedtime. 08/21/18  Yes Gladstone Lighter, MD  oxyCODONE (OXY IR/ROXICODONE) 5 MG immediate release tablet Take 1 tablet (5 mg total) by mouth 2 (two) times daily as needed for severe pain. Must last 30 days 02/17/21 03/19/21  Milinda Pointer, MD    Allergies Patient has no known allergies.  Family History  Problem Relation Age of Onset  . Aneurysm Other     Social History Social History   Tobacco Use  . Smoking status: Current Some Day Smoker    Packs/day: 0.00    Types: Cigarettes  . Smokeless tobacco: Never Used  . Tobacco comment: pt states 2 cigs per month   Substance Use Topics  . Alcohol use: No  . Drug use: No    Review of Systems  Constitutional: No fever/chills Eyes: No visual changes. ENT: No sore throat. Cardiovascular: Denies chest pain. Respiratory: Positive for increased shortness of breath and cough.  Negative for increased sputum production Gastrointestinal: No abdominal pain.  No nausea, no vomiting.  No diarrhea.  No constipation. Genitourinary: Negative for dysuria. Musculoskeletal: Negative for back pain. Skin: Negative for rash. Neurological: Negative for  headaches, focal weakness or numbness.  ____________________________________________   PHYSICAL EXAM:  VITAL SIGNS: Vitals:   03/21/21 1100 03/21/21 1130  BP: (!) 161/82 132/67  Pulse: (!) 123 (!) 114  Resp: (!) 26 (!) 21  Temp:    SpO2: 96% 99%    Constitutional: Alert and oriented. Well appearing and in no acute distress.  Hard of hearing, conversational. Eyes: Conjunctivae are normal. PERRL. EOMI. Head: Atraumatic. Nose: No congestion/rhinnorhea. Mouth/Throat: Mucous membranes are moist.  Oropharynx non-erythematous. Neck: No stridor. No cervical spine tenderness to palpation. Cardiovascular: Tachycardic rate, regular rhythm. Grossly normal heart sounds.  Good peripheral circulation. Respiratory: Normoxic on 2 L O2.  Slightly tachypneic to the mid 20s, no further evidence of distress.  Decreased air movement throughout with diffuse and scattered expiratory wheezes. Gastrointestinal: Soft , nondistended, nontender to palpation. No CVA tenderness. Musculoskeletal: No lower extremity tenderness nor edema.  No joint effusions. No signs of acute trauma. Dry gangrene to the right third toe.  S/p amputation to the right second and fourth toe.  No erythema, induration, swelling to the right foot. Neurologic:  Normal speech and language. No gross focal neurologic deficits are appreciated. Skin:  Skin is warm, dry and intact. No rash noted. Psychiatric: Mood and affect are normal. Speech and behavior are normal. ____________________________________________   LABS (all labs ordered are listed, but only abnormal results are displayed)  Labs Reviewed  COMPREHENSIVE METABOLIC PANEL - Abnormal; Notable for the following components:      Result Value   Calcium 8.8 (*)    Albumin 3.2 (*)    All other components within normal limits  CBC WITH DIFFERENTIAL/PLATELET - Abnormal; Notable for the following components:   Hemoglobin 9.1 (*)    HCT 30.9 (*)    MCV 78.6 (*)    MCH 23.2 (*)     MCHC 29.4 (*)    Platelets 440 (*)    All other components within normal limits  RESP PANEL BY RT-PCR (FLU A&B, COVID) ARPGX2  TROPONIN I (HIGH SENSITIVITY)  TROPONIN I (HIGH SENSITIVITY)   ____________________________________________  12 Lead EKG  Sinus rhythm, rate of 110 bpm.  Normal axis and intervals.  No evidence of acute ischemia.  Sinus tachycardia. ____________________________________________  RADIOLOGY  ED MD interpretation: 1 view CXR reviewed by me with chronic hyperinflation and acute  infiltrates to the right lower lobe  Official radiology report(s): DG Chest Portable 1 View  Result Date: 03/21/2021 CLINICAL DATA:  Shortness of breath and COPD exacerbation. EXAM: PORTABLE CHEST 1 VIEW COMPARISON:  01/11/2021 and CTA of the chest on 01/13/2021 FINDINGS: The heart size and mediastinal contours are within normal limits. Chronic emphysematous lung disease again noted. New areas of parenchymal opacity in the right upper and lower lung fields are suspicious for acute pneumonia but may also represent areas of atelectasis. No edema, pneumothorax or pleural fluid identified. The visualized skeletal structures are unremarkable. IMPRESSION: Chronic emphysema with new parenchymal opacities in the right upper and lower lungs suspicious for acute pneumonia. Electronically Signed   By: Aletta Edouard M.D.   On: 03/21/2021 08:19    ____________________________________________   PROCEDURES and INTERVENTIONS  Procedure(s) performed (including Critical Care):  .1-3 Lead EKG Interpretation Performed by: Vladimir Crofts, MD Authorized by: Vladimir Crofts, MD     Interpretation: abnormal     ECG rate:  110   ECG rate assessment: tachycardic     Rhythm: sinus tachycardia     Ectopy: none     Conduction: normal      Medications  ipratropium-albuterol (DUONEB) 0.5-2.5 (3) MG/3ML nebulizer solution 3 mL (3 mLs Nebulization Given 03/21/21 0857)  methylPREDNISolone sodium succinate  (SOLU-MEDROL) 125 mg/2 mL injection 125 mg (125 mg Intravenous Given 03/21/21 0856)  ALPRAZolam Duanne Moron) tablet 0.5 mg (0.5 mg Oral Given 03/21/21 0857)  doxycycline (VIBRA-TABS) tablet 100 mg (100 mg Oral Given 03/21/21 1039)    ____________________________________________   MDM / ED COURSE   75 year old woman with COPD presents from home with evidence of COPD exacerbation in setting of not having her inhalers, ultimately amenable to outpatient management.  Presents tachycardic and tachypneic but hemodynamically stable.  Exam with stigmata of COPD exacerbation with diffuse expiratory wheezes, increased respiratory rate and decreased airflow.  Blood work with chronic microcytic anemia and otherwise unremarkable.  EKG with sinus tach without evidence of ACS.  CXR with streaky infiltrates.  Patient reports significant improvement of symptoms after breathing treatments and steroids.  I discussed with her appropriate usage of her Advair daily inhaler, provided prescriptions for prednisone and a new albuterol rescue inhaler.  Due to her increased sputum production and CXR findings, she was started on a course of doxycycline.  I see no evidence of pathology to preclude outpatient management.  We discussed return precautions for the ED prior to discharge.      ____________________________________________   FINAL CLINICAL IMPRESSION(S) / ED DIAGNOSES  Final diagnoses:  COPD exacerbation ([place])  SOB (shortness of breath)  Hypoxia     ED Discharge Orders         Ordered    predniSONE (DELTASONE) 50 MG tablet  Daily        03/21/21 0847    albuterol (VENTOLIN HFA) 108 (90 Base) MCG/ACT inhaler  Every 6 hours PRN        03/21/21 0847    doxycycline (VIBRA-TABS) 100 MG tablet  2 times daily        03/21/21 1135           [name]   Note:  This document was prepared using Systems analyst and may include unintentional dictation errors.   Vladimir Crofts, MD 03/21/21 [phone]

## 2021-03-21 NOTE — Discharge Instructions (Signed)
As we discussed, please use your Advair disc inhaler twice a day every day.  This is to help prevent worsening of your breathing.  You are being discharged with 2 prescriptions.  A new rescue inhaler (albuterol) to use as needed every 4-6 hours on top of your Advair. Prednisone steroids to start taking tomorrow, May 3 Tuesday, take once daily for the next 4 days.  Please finish all the tablets this will help with your lungs.  For developing worsening symptoms despite these medications, please return to the ED.  Follow-up with your PCP to discuss your Xanax prescription

## 2021-03-21 NOTE — ED Notes (Signed)
Pt has been provided with discharge instructions. Pt denies any questions or concerns at this time. Pt verbalizes understanding for follow up care and d/c.  VSS.  Pt left department with all belongings.  

## 2021-04-07 ENCOUNTER — Other Ambulatory Visit: Payer: Self-pay | Admitting: Podiatry

## 2021-04-13 ENCOUNTER — Encounter
Admission: RE | Admit: 2021-04-13 | Discharge: 2021-04-13 | Disposition: A | Discharge status: Home / Self Care | Payer: Medicare HMO | Source: Ambulatory Visit | Attending: Podiatry | Admitting: Podiatry

## 2021-04-13 ENCOUNTER — Other Ambulatory Visit: Payer: Self-pay

## 2021-04-13 HISTORY — DX: Peripheral vascular disease, unspecified: I73.9

## 2021-04-13 HISTORY — DX: Essential (primary) hypertension: I10

## 2021-04-13 NOTE — Pre-Procedure Instructions (Signed)
Called Dr Alvera Singh office to inquire about whether or not patient should hold or continue aspirin and/or Eliquis before surgery on 6/3. Per Serina Cowper patient can hold Eliquis 5 days prior to surgery and continue Aspirin 81 mg.

## 2021-04-13 NOTE — Patient Instructions (Addendum)
Your procedure is scheduled on: 04/22/21 Report to [facility]. To find out your arrival time please call [phone] between 1PM - 3PM on 04/21/21.  Remember: Instructions that are not followed completely may result in serious medical risk, up to and including death, or upon the discretion of your surgeon and anesthesiologist your surgery may need to be rescheduled.     _X__ 1. Do not eat food after midnight the night before your procedure.                 No gum chewing or hard candies. You may drink clear liquids up to 2 hours                 before you are scheduled to arrive for your surgery- DO not drink clear                 liquids within 2 hours of the start of your surgery.                 Clear Liquids include:  water, apple juice without pulp, clear carbohydrate                 drink such as Clearfast or Gatorade, Black Coffee or Tea (Do not add                 anything to coffee or tea). Diabetics water only  __X__2.  On the morning of surgery brush your teeth with toothpaste and water, you                 may rinse your mouth with mouthwash if you wish.  Do not swallow any              toothpaste of mouthwash.     _X__ 3.  No Alcohol for 24 hours before or after surgery.   _X__ 4.  Do Not Smoke or use e-cigarettes For 24 Hours Prior to Your Surgery.                 Do not use any chewable tobacco products for at least 6 hours prior to                 surgery.  ____  5.  Bring all medications with you on the day of surgery if instructed.   __X__  6.  Notify your doctor if there is any change in your medical condition      (cold, fever, infections).     Do not wear jewelry, make-up, hairpins, clips or nail polish. Do not wear lotions, powders, or perfumes.  Do not shave 48 hours prior to surgery. Men may shave face and neck. Do not bring valuables to the hospital.    [facility] is not responsible for any belongings or  valuables.  Contacts, dentures/partials or body piercings may not be worn into surgery. Bring a case for your contacts, glasses or hearing aids, a denture cup will be supplied. Leave your suitcase in the car. After surgery it may be brought to your room. For patients admitted to the hospital, discharge time is determined by your treatment team.   Patients discharged the day of surgery will not be allowed to drive home.   Please read over the following fact sheets that you were given:   INCENTIVE SPIROMETER  __X__ Take these medicines the morning of surgery with A SIP OF WATER:  1. atorvastatin (LIPITOR) 10 MG tablet  2. buPROPion (WELLBUTRIN SR) 150 MG 12 hr tablet  3. escitalopram (LEXAPRO) 10 MG tablet  4. levothyroxine (SYNTHROID, LEVOTHROID) 88 MCG tablet  5. ALPRAZolam (XANAX) 0.5 MG tablet IF NEEDED  6. oxyCODONE (OXY IR/ROXICODONE) 5 MG immediate release tablet IF NEEDED  ____ Fleet Enema (as directed)   ____ Use CHG Soap/SAGE wipes as directed  __X__ Use inhalers on the day of surgery  ____ Stop metformin/Janumet/Farxiga 2 days prior to surgery    ____ Take 1/2 of usual insulin dose the night before surgery. No insulin the morning          of surgery.   __X__ Stop Blood Thinners Coumadin/Plavix/Xarelto/Pleta/Pradaxa/Eliquis/Effient/Aspirin  on   Or contact your Surgeon, Cardiologist or Medical Doctor regarding  ability to stop your blood thinners  __X__ Stop Anti-inflammatories 7 days before surgery such as Advil, Ibuprofen, Motrin,  BC or Goodies Powder, Naprosyn, Naproxen, Aleve, Aspirin    __X__ Stop all herbal supplements, fish oil or vitamin E until after surgery.    ____ Bring C-Pap to the hospital.      Hold Eliquis for 5 days before surgery. You may continue taking your Aspirin daily except on the actual surgery day (Friday 04/22/21)

## 2021-04-22 ENCOUNTER — Ambulatory Visit: Payer: Medicare HMO | Admitting: Registered Nurse

## 2021-04-22 ENCOUNTER — Encounter
Admission: RE | Disposition: A | Discharge status: Home / Self Care | Payer: Self-pay | Source: Home / Self Care | Attending: Podiatry

## 2021-04-22 ENCOUNTER — Other Ambulatory Visit: Payer: Self-pay

## 2021-04-22 ENCOUNTER — Ambulatory Visit
Admission: RE | Admit: 2021-04-22 | Discharge: 2021-04-22 | Disposition: A | Discharge status: Home / Self Care | Payer: Medicare HMO | Attending: Podiatry | Admitting: Podiatry

## 2021-04-22 ENCOUNTER — Encounter: Payer: Self-pay | Admitting: Podiatry

## 2021-04-22 DIAGNOSIS — M86171 Other acute osteomyelitis, right ankle and foot: Secondary | ICD-10-CM | POA: Diagnosis not present

## 2021-04-22 DIAGNOSIS — Z89422 Acquired absence of other left toe(s): Secondary | ICD-10-CM | POA: Insufficient documentation

## 2021-04-22 DIAGNOSIS — F17219 Nicotine dependence, cigarettes, with unspecified nicotine-induced disorders: Secondary | ICD-10-CM | POA: Diagnosis not present

## 2021-04-22 DIAGNOSIS — Z79890 Hormone replacement therapy (postmenopausal): Secondary | ICD-10-CM | POA: Insufficient documentation

## 2021-04-22 DIAGNOSIS — Z7982 Long term (current) use of aspirin: Secondary | ICD-10-CM | POA: Diagnosis not present

## 2021-04-22 DIAGNOSIS — J449 Chronic obstructive pulmonary disease, unspecified: Secondary | ICD-10-CM | POA: Diagnosis not present

## 2021-04-22 DIAGNOSIS — Z7951 Long term (current) use of inhaled steroids: Secondary | ICD-10-CM | POA: Diagnosis not present

## 2021-04-22 DIAGNOSIS — I70261 Atherosclerosis of native arteries of extremities with gangrene, right leg: Secondary | ICD-10-CM | POA: Insufficient documentation

## 2021-04-22 DIAGNOSIS — Z79899 Other long term (current) drug therapy: Secondary | ICD-10-CM | POA: Diagnosis not present

## 2021-04-22 DIAGNOSIS — Z7901 Long term (current) use of anticoagulants: Secondary | ICD-10-CM | POA: Diagnosis not present

## 2021-04-22 HISTORY — PX: AMPUTATION TOE: SHX6595

## 2021-04-22 SURGERY — AMPUTATION, TOE
Anesthesia: General | Site: Toe | Laterality: Right

## 2021-04-22 MED ORDER — PROPOFOL 10 MG/ML IV BOLUS
INTRAVENOUS | Status: AC
  Filled 2021-04-22: qty 20

## 2021-04-22 MED ORDER — LACTATED RINGERS IV SOLN: INTRAVENOUS | Status: DC

## 2021-04-22 MED ORDER — CEFAZOLIN SODIUM-DEXTROSE 2-4 GM/100ML-% IV SOLN
2.0000 g | INTRAVENOUS | Status: AC
  Administered 2021-04-22: 2 g via INTRAVENOUS

## 2021-04-22 MED ORDER — OXYCODONE-ACETAMINOPHEN 5-325 MG PO TABS: 1.0000 | ORAL_TABLET | Freq: Four times a day (QID) | ORAL | 0 refills | Status: DC | PRN

## 2021-04-22 MED ORDER — METOCLOPRAMIDE HCL 5 MG/ML IJ SOLN: 5.0000 mg | Freq: Three times a day (TID) | INTRAMUSCULAR | Status: DC | PRN

## 2021-04-22 MED ORDER — GABAPENTIN 300 MG PO CAPS: 300.0000 mg | ORAL_CAPSULE | Freq: Every day | ORAL | 2 refills | Status: DC

## 2021-04-22 MED ORDER — LIDOCAINE HCL (PF) 1 % IJ SOLN
INTRAMUSCULAR | Status: DC | PRN
  Administered 2021-04-22: 5 mL

## 2021-04-22 MED ORDER — ONDANSETRON HCL 4 MG/2ML IJ SOLN: 4.0000 mg | Freq: Four times a day (QID) | INTRAMUSCULAR | Status: DC | PRN

## 2021-04-22 MED ORDER — CHLORHEXIDINE GLUCONATE 0.12 % MT SOLN
OROMUCOSAL | Status: AC
  Administered 2021-04-22: 15 mL via OROMUCOSAL
  Filled 2021-04-22: qty 15

## 2021-04-22 MED ORDER — EPHEDRINE 5 MG/ML INJ
INTRAVENOUS | Status: AC
  Filled 2021-04-22: qty 10

## 2021-04-22 MED ORDER — MIDAZOLAM HCL 2 MG/2ML IJ SOLN
INTRAMUSCULAR | Status: AC
  Filled 2021-04-22: qty 2

## 2021-04-22 MED ORDER — ACETAMINOPHEN 325 MG PO TABS
650.0000 mg | ORAL_TABLET | Freq: Four times a day (QID) | ORAL | Status: DC | PRN
  Administered 2021-04-22: 650 mg via ORAL

## 2021-04-22 MED ORDER — FAMOTIDINE 20 MG PO TABS: 20.0000 mg | ORAL_TABLET | Freq: Once | ORAL | Status: AC

## 2021-04-22 MED ORDER — ACETAMINOPHEN 325 MG PO TABS
ORAL_TABLET | ORAL | Status: AC
  Filled 2021-04-22: qty 2

## 2021-04-22 MED ORDER — FAMOTIDINE 20 MG PO TABS
ORAL_TABLET | ORAL | Status: AC
  Administered 2021-04-22: 20 mg via ORAL
  Filled 2021-04-22: qty 1

## 2021-04-22 MED ORDER — LIDOCAINE HCL (PF) 2 % IJ SOLN
INTRAMUSCULAR | Status: AC
  Filled 2021-04-22: qty 10

## 2021-04-22 MED ORDER — PROPOFOL 10 MG/ML IV BOLUS
INTRAVENOUS | Status: DC | PRN
  Administered 2021-04-22: 20 mg via INTRAVENOUS
  Administered 2021-04-22: 50 mg via INTRAVENOUS

## 2021-04-22 MED ORDER — ACETAMINOPHEN 10 MG/ML IV SOLN
INTRAVENOUS | Status: DC | PRN
  Administered 2021-04-22: 1000 mg via INTRAVENOUS

## 2021-04-22 MED ORDER — POTASSIUM CHLORIDE CRYS ER 20 MEQ PO TBCR
EXTENDED_RELEASE_TABLET | ORAL | Status: AC
  Filled 2021-04-22: qty 1

## 2021-04-22 MED ORDER — CHLORHEXIDINE GLUCONATE 0.12 % MT SOLN: 15.0000 mL | Freq: Once | OROMUCOSAL | Status: AC

## 2021-04-22 MED ORDER — ONDANSETRON HCL 4 MG PO TABS: 4.0000 mg | ORAL_TABLET | Freq: Four times a day (QID) | ORAL | Status: DC | PRN

## 2021-04-22 MED ORDER — ACETAMINOPHEN 10 MG/ML IV SOLN
INTRAVENOUS | Status: AC
  Filled 2021-04-22: qty 100

## 2021-04-22 MED ORDER — ORAL CARE MOUTH RINSE: 15.0000 mL | Freq: Once | OROMUCOSAL | Status: AC

## 2021-04-22 MED ORDER — BUPIVACAINE HCL 0.5 % IJ SOLN
INTRAMUSCULAR | Status: DC | PRN
  Administered 2021-04-22: 8 mL

## 2021-04-22 MED ORDER — CEFAZOLIN SODIUM-DEXTROSE 2-4 GM/100ML-% IV SOLN
INTRAVENOUS | Status: AC
  Filled 2021-04-22: qty 100

## 2021-04-22 MED ORDER — FENTANYL CITRATE (PF) 100 MCG/2ML IJ SOLN
INTRAMUSCULAR | Status: DC | PRN
  Administered 2021-04-22 (×2): 25 ug via INTRAVENOUS

## 2021-04-22 MED ORDER — POVIDONE-IODINE 7.5 % EX SOLN
Freq: Once | CUTANEOUS | Status: DC
  Filled 2021-04-22: qty 118

## 2021-04-22 MED ORDER — FENTANYL CITRATE (PF) 100 MCG/2ML IJ SOLN
INTRAMUSCULAR | Status: AC
  Filled 2021-04-22: qty 2

## 2021-04-22 MED ORDER — METOCLOPRAMIDE HCL 10 MG PO TABS: 5.0000 mg | ORAL_TABLET | Freq: Three times a day (TID) | ORAL | Status: DC | PRN

## 2021-04-22 MED ORDER — PROPOFOL 500 MG/50ML IV EMUL
INTRAVENOUS | Status: DC | PRN
  Administered 2021-04-22: 50 ug/kg/min via INTRAVENOUS

## 2021-04-22 SURGICAL SUPPLY — 46 items
BLADE OSC/SAGITTAL MD 5.5X18 (BLADE) ×2 IMPLANT
BLADE SURG MINI STRL (BLADE) ×2 IMPLANT
BNDG CMPR STD VLCR NS LF 5.8X4 (GAUZE/BANDAGES/DRESSINGS) ×1
BNDG CONFORM 2 STRL LF (GAUZE/BANDAGES/DRESSINGS) ×2 IMPLANT
BNDG CONFORM 3 STRL LF (GAUZE/BANDAGES/DRESSINGS) ×4 IMPLANT
BNDG ELASTIC 4X5.8 VLCR NS LF (GAUZE/BANDAGES/DRESSINGS) ×2 IMPLANT
BNDG ESMARK 4X12 TAN STRL LF (GAUZE/BANDAGES/DRESSINGS) ×2 IMPLANT
BNDG GAUZE 4.5X4.1 6PLY STRL (MISCELLANEOUS) ×2 IMPLANT
CANISTER SUCT 1200ML W/VALVE (MISCELLANEOUS) ×2 IMPLANT
COVER WAND RF STERILE (DRAPES) ×2 IMPLANT
CUFF TOURN SGL QUICK 12 (TOURNIQUET CUFF) IMPLANT
CUFF TOURN SGL QUICK 18X4 (TOURNIQUET CUFF) IMPLANT
DRAPE FLUOR MINI C-ARM 54X84 (DRAPES) ×2 IMPLANT
DRAPE XRAY CASSETTE 23X24 (DRAPES) ×2 IMPLANT
DURAPREP 26ML APPLICATOR (WOUND CARE) ×2 IMPLANT
ELECT REM PT RETURN 9FT ADLT (ELECTROSURGICAL) ×2
ELECTRODE REM PT RTRN 9FT ADLT (ELECTROSURGICAL) ×1 IMPLANT
GAUZE PACKING IODOFORM 1/2 (PACKING) ×2 IMPLANT
GAUZE SPONGE 4X4 12PLY STRL (GAUZE/BANDAGES/DRESSINGS) ×2 IMPLANT
GAUZE XEROFORM 1X8 LF (GAUZE/BANDAGES/DRESSINGS) ×2 IMPLANT
GLOVE SURG ENC MOIS LTX SZ7.5 (GLOVE) ×2 IMPLANT
GLOVE SURG UNDER LTX SZ8 (GLOVE) ×2 IMPLANT
GOWN STRL REUS W/ TWL XL LVL3 (GOWN DISPOSABLE) ×2 IMPLANT
GOWN STRL REUS W/TWL XL LVL3 (GOWN DISPOSABLE) ×4
IV NS IRRIG 3000ML ARTHROMATIC (IV SOLUTION) ×2 IMPLANT
KIT TURNOVER KIT A (KITS) ×2 IMPLANT
LABEL OR SOLS (LABEL) ×2 IMPLANT
MANIFOLD NEPTUNE II (INSTRUMENTS) ×2 IMPLANT
NDL FILTER BLUNT 18X1 1/2 (NEEDLE) ×1 IMPLANT
NDL HYPO 25X1 1.5 SAFETY (NEEDLE) ×1 IMPLANT
NEEDLE FILTER BLUNT 18X 1/2SAF (NEEDLE) ×1
NEEDLE FILTER BLUNT 18X1 1/2 (NEEDLE) ×1 IMPLANT
NEEDLE HYPO 25X1 1.5 SAFETY (NEEDLE) ×2 IMPLANT
NS IRRIG 500ML POUR BTL (IV SOLUTION) ×2 IMPLANT
PACK EXTREMITY ARMC (MISCELLANEOUS) ×2 IMPLANT
PAD ABD DERMACEA PRESS 5X9 (GAUZE/BANDAGES/DRESSINGS) ×4 IMPLANT
PULSAVAC PLUS IRRIG FAN TIP (DISPOSABLE) ×2
SHIELD FULL FACE ANTIFOG 7M (MISCELLANEOUS) ×2 IMPLANT
STOCKINETTE M/LG 89821 (MISCELLANEOUS) ×2 IMPLANT
STRAP SAFETY 5IN WIDE (MISCELLANEOUS) ×2 IMPLANT
SUT ETHILON 3-0 FS-10 30 BLK (SUTURE) ×2
SUT ETHILON 5-0 FS-2 18 BLK (SUTURE) ×2 IMPLANT
SUT VIC AB 4-0 FS2 27 (SUTURE) ×2 IMPLANT
SUTURE EHLN 3-0 FS-10 30 BLK (SUTURE) ×1 IMPLANT
SYR 10ML LL (SYRINGE) ×6 IMPLANT
TIP FAN IRRIG PULSAVAC PLUS (DISPOSABLE) ×1 IMPLANT

## 2021-04-22 NOTE — H&P (Signed)
HISTORY AND PHYSICAL INTERVAL NOTE:  04/22/2021  9:03 AM  [name]  has presented today for surgery, with the diagnosis of I96 - Gangrene of toe.  The various methods of treatment have been discussed with the patient.  No guarantees were given.  After consideration of risks, benefits and other options for treatment, the patient has consented to surgery.  I have reviewed the patients' chart and labs.     [name] history and physical examination was performed in my office.  The patient was reexamined.  There have been no changes to this history and physical examination.  [name]

## 2021-04-22 NOTE — Op Note (Signed)
Operative note   Surgeon:Deidrea Gaetz Lawyer: None    Preop diagnosis: Gangrene right second toe    Postop diagnosis: Same    Procedure: Amputation right second toe metatarsophalangeal joint    EBL: Minimal    Anesthesia:local and IV sedation    Hemostasis: None    Specimen: Gangrenous right second toe    Complications: None    Operative indications:Ann T. Puls is an 75 y.o. that presents today for surgical intervention.  The risks/benefits/alternatives/complications have been discussed and consent has been given.    Procedure:  Patient was brought into the OR and placed on the operating table in thesupine position. After anesthesia was obtained theright lower extremity was prepped and draped in usual sterile fashion.  Attention was directed to the right foot where 2 semielliptical incisions were made at the base of the second toe.  This was just proximal to the gangrenous tissue.  Full-thickness flaps were created.  The toe was disarticulated at the metatarsophalangeal joint and sent off for pathological examination.  Minimal bleeding was noted.  The wound was flushed with copious amounts of irrigation.  Closure was then performed with a 3-0 nylon.  A bulky sterile dressing was applied to the right foot.    Patient tolerated the procedure and anesthesia well.  Was transported from the OR to the PACU with all vital signs stable and vascular status intact. To be discharged per routine protocol.  Will follow up in approximately 1 week in the outpatient clinic.

## 2021-04-22 NOTE — Anesthesia Postprocedure Evaluation (Signed)
Anesthesia Post Note  Patient: [name]  Procedure(s) Performed: AMPUTATION TOE- RIGHT 2ND (Right Toe)  Patient location during evaluation: PACU Anesthesia Type: General Level of consciousness: awake and alert and oriented Pain management: pain level controlled Vital Signs Assessment: post-procedure vital signs reviewed and stable Respiratory status: spontaneous breathing, nonlabored ventilation and respiratory function stable Cardiovascular status: blood pressure returned to baseline and stable Postop Assessment: no signs of nausea or vomiting Anesthetic complications: no   No complications documented.   Last Vitals:  Vitals:   04/22/21 1100 04/22/21 1116  BP: (!) 145/78 (!) 164/69  Pulse: 86 78  Resp: 16 14  Temp: 37.1 C 37.1 C  SpO2: 99% 100%    Last Pain:  Vitals:   04/22/21 1116  TempSrc: Temporal  PainSc: 0-No pain                 Kahley Leib

## 2021-04-22 NOTE — Discharge Instructions (Signed)
[place]  POST OPERATIVE INSTRUCTIONS FOR DR. Vickki Muff AND DR. Prince   1. Take your medication as prescribed.  Pain medication should be taken only as needed.  2. Keep the dressing clean, dry and intact.  3. Keep your foot elevated above the heart level for the first 48 hours.  4. Walking to the bathroom and brief periods of walking are acceptable, unless we have instructed you to be non-weight bearing.  5. Always wear your post-op shoe when walking.  Always use your crutches if you are to be non-weight bearing.  6. Do not take a shower. Baths are permissible as long as the foot is kept out of the water.   7. Every hour you are awake:  - Bend your knee 15 times. - Flex foot 15 times - Massage calf 15 times  8. Call [facility] [phone] if any of the following problems occur: - You develop a temperature or fever. - The bandage becomes saturated with blood. - Medication does not stop your pain. - Injury of the foot occurs. - Any symptoms of infection including redness, odor, or red streaks running from wound.    AMBULATORY SURGERY  DISCHARGE INSTRUCTIONS   1) The drugs that you were given will stay in your system until tomorrow so for the next 24 hours you should not:  A) Drive an automobile B) Make any legal decisions C) Drink any alcoholic beverage   2) You may resume regular meals tomorrow.  Today it is better to start with liquids and gradually work up to solid foods.  You may eat anything you prefer, but it is better to start with liquids, then soup and crackers, and gradually work up to solid foods.   3) Please notify your doctor immediately if you have any unusual bleeding, trouble breathing, redness and pain at the surgery site, drainage, fever, or pain not relieved by medication.    4) Additional Instructions:        Please contact your physician with any  problems or Same Day Surgery at [phone], Monday through Friday 6 am to 4 pm, or [place] at [facility] number at [phone].

## 2021-04-22 NOTE — Anesthesia Preprocedure Evaluation (Signed)
Anesthesia Evaluation  Patient identified by MRN, date of birth, ID band Patient awake    Reviewed: Allergy & Precautions, NPO status , Patient's Chart, lab work & pertinent test results  History of Anesthesia Complications (+) DIFFICULT AIRWAY and history of anesthetic complications  Airway Mallampati: III  TM Distance: >3 FB Neck ROM: Limited    Dental  (+) Poor Dentition, Missing, Implants   Pulmonary COPD, Current Smoker and Patient abstained from smoking.,    breath sounds clear to auscultation- rhonchi (-) wheezing      Cardiovascular hypertension, Pt. on medications + CAD and + Peripheral Vascular Disease  (-) Past MI, (-) Cardiac Stents and (-) CABG  Rhythm:Regular Rate:Normal - Systolic murmurs and - Diastolic murmurs    Neuro/Psych neg Seizures PSYCHIATRIC DISORDERS Anxiety Depression negative neurological ROS     GI/Hepatic Neg liver ROS, hiatal hernia, GERD  ,  Endo/Other  neg diabetesHypothyroidism   Renal/GU negative Renal ROS     Musculoskeletal  (+) Arthritis ,   Abdominal (+) - obese,   Peds  Hematology negative hematology ROS (+)   Anesthesia Other Findings Past Medical History: No date: Anxiety No date: CAD (coronary artery disease) No date: Carpal tunnel syndrome No date: Cataract No date: Complication of anesthesia     Comment:  has woken up at times No date: COPD (chronic obstructive pulmonary disease) ([place]) 09/14/2015: CRP elevated No date: Depression No date: Difficult intubation No date: Dyslipidemia No date: Dyspnea     Comment:  DOE No date: Dysrhythmia     Comment:  hx palpatations 09/14/2015: Elevated sedimentation rate No date: Esophageal spasm No date: Gastrointestinal parasites No date: GERD (gastroesophageal reflux disease) No date: Hiatal hernia No date: History of peptic ulcer disease No date: Hypertension No date: Hyperthyroidism No date:  Hypothyroidism 09/14/2015: Low magnesium levels 2008: Pelvic fracture ([place])     Comment:  fall from riding a horse No date: Peripheral vascular disease (HCC) No date: Reflux No date: Rotator cuff injury No date: Stenosis, spinal, lumbar   Reproductive/Obstetrics                             Anesthesia Physical Anesthesia Plan  ASA: III  Anesthesia Plan: General   Post-op Pain Management:    Induction: Intravenous  PONV Risk Score and Plan: 1 and Propofol infusion  Airway Management Planned: Natural Airway  Additional Equipment:   Intra-op Plan:   Post-operative Plan:   Informed Consent: I have reviewed the patients History and Physical, chart, labs and discussed the procedure including the risks, benefits and alternatives for the proposed anesthesia with the patient or authorized representative who has indicated his/her understanding and acceptance.     Dental advisory given  Plan Discussed with: CRNA and Anesthesiologist  Anesthesia Plan Comments:         Anesthesia Quick Evaluation

## 2021-04-22 NOTE — Anesthesia Procedure Notes (Signed)
Date/Time: 04/22/2021 9:45 AM Performed by: Allean Found, CRNA Pre-anesthesia Checklist: Patient identified, Emergency Drugs available, Suction available, Patient being monitored and Timeout performed Oxygen Delivery Method: Simple face mask Placement Confirmation: positive ETCO2

## 2021-04-22 NOTE — Transfer of Care (Signed)
Immediate Anesthesia Transfer of Care Note  Patient: [name]  Procedure(s) Performed: AMPUTATION TOE- RIGHT 2ND (Right Toe)  Patient Location: PACU  Anesthesia Type:General  Level of Consciousness: awake, alert  and oriented  Airway & Oxygen Therapy: Patient Spontanous Breathing and Patient connected to face mask oxygen  Post-op Assessment: Report given to RN and Post -op Vital signs reviewed and stable  Post vital signs: Reviewed and stable  Last Vitals:  Vitals Value Taken Time  BP 145/63 04/22/21 1036  Temp 36.8 C 04/22/21 1036  Pulse 86 04/22/21 1043  Resp 25 04/22/21 1043  SpO2 100 % 04/22/21 1043  Vitals shown include unvalidated device data.  Last Pain:  Vitals:   04/22/21 0820  TempSrc: Temporal  PainSc: 9          Complications: No complications documented.

## 2021-04-25 LAB — SURGICAL PATHOLOGY

## 2021-05-17 NOTE — Progress Notes (Deleted)
12:40 visit canceled at 11:40.

## 2021-05-18 ENCOUNTER — Encounter: Payer: Medicare HMO | Admitting: Pain Medicine

## 2021-07-12 NOTE — Progress Notes (Signed)
PROVIDER NOTE: Information contained herein reflects review and annotations entered in association with encounter. Interpretation of such information and data should be left to medically-trained personnel. Information provided to patient can be located elsewhere in the medical record under "Patient Instructions". Document created using STT-dictation technology, any transcriptional errors that may result from process are unintentional.    Patient: [name]  Service Category: E/M  Provider: Gaspar Cola, MD  DOB: [birth year]  DOS: 07/13/2021  Specialty: Interventional Pain Management  MRN: [id number]  Setting: Ambulatory outpatient  PCP: Alison Billet, MD  Type: Established Patient    Referring Provider: Albina Billet, MD  Location: Office  Delivery: Face-to-face     HPI  [name], a 75 y.o. year old female, is here today because of her Chronic right-sided low back pain without sciatica [M54.50, G89.29]. [name] primary complain today is Back Pain and Joint Pain Last encounter: My last encounter with her was on 05/18/2021. Pertinent problems: 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); Lumbar facet hypertrophy (Bilateral); Lumbar facet syndrome (Right); Lumbar grade 1 Anterolisthesis of L3 over L4; Hip fracture ([place]); Pressure injury of skin; Closed displaced intertrochanteric fracture of right femur  ([place]); Right hip pain; Intertrochanteric fracture of right hip, sequela; Chronic sacroiliac joint pain (Right); Neurogenic pain; Chronic low back pain (1ry area of Pain) (Right) w/o sciatica; Chronic hip pain (Bilateral); Compression fracture of L3 vertebra ([place]); Dry gangrene ([place]) of middle toe, right foot; Foot pain, right; Closed fracture of hip, sequela (Left); Chronic hip pain (Left); and Hip pain, acute (Left) on their pertinent problem list. Pain Assessment: Severity of Chronic pain is reported as a 8 /10. Location: Back (joint pain) Lower/"I hurt all over". Onset: More than a month ago. Quality: Aching, Dull, Throbbing. Timing: Constant. Modifying factor(s): nothing. Vitals:  height is _0  (1.626 m) and weight is 104 lb (47.2 kg). Her temperature is 97 F (36.1 C) (abnormal). Her blood pressure is 158/122 (abnormal) and her pulse is 59 (abnormal). Her respiration is 16 and oxygen saturation is 93%.   Reason for encounter: medication management.   The patient indicates doing well with the current medication regimen. No adverse reactions or side effects reported to the medications.  This was a rather long appointment since the patient is a little bit hard of hearing.  She was last seen by me on 02/17/2021, at which time I provided her with a prescription for her oxycodone IR 5 mg to be taken 1 tablet p.o. twice daily.  On 12/26/2020 she underwent a right foot, third toe amputation by Dr. Vickki Muff.  Around February 25 she fell and broke her left hip requiring entire trochanteric IM nailing of her hip by Dr. Harlow Mares.  She then went to staying at a nursing home ([facility]).  On 04/22/2021 she underwent a second toe amputation on the right foot, again by Dr. 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  She has severe peripheral vascular disease and this is reason for the poor blood flow and the  amputations.  Today the patient asked me to take a look at her foot and currently the wounds  look well-healed with no evidence of gangrene.  There is adequate blood flow to the toes with return of blood after putting pressure on the skin, of less than 15 seconds.  She has lost some weight and currently she is already on 4 pounds.  She has multiple medical problems and today she continued to ask me about those.  I have redirected her to her primary care physician since these are things that are outside of my realm of expertise.  Today I also took the time to explain to the patient that she will be getting prescriptions for her medications to last for 3 months.  I have explained to her also the issues regarding "refills" of C2's.  I had to repeat myself several times to get all of this information across since he is hard of hearing.  RTCB: 10/11/2021 Nonopioids transfer 11/03/2020: Zanaflex  Pharmacotherapy Assessment  Analgesic: Oxycodone IR 5 mg, 1 tab PO BID (10 mg/day of oxycodone) MME/day: 15 mg/day.   Monitoring: [place] PMP: PDMP reviewed during this encounter.       Pharmacotherapy: No side-effects or adverse reactions reported. Compliance: No problems identified. Effectiveness: Clinically acceptable.  Dewayne Shorter, RN  07/13/2021  2:26 PM  Sign when Signing Visit Nursing Pain Medication Assessment:  Safety precautions to be maintained throughout the outpatient stay will include: orient to surroundings, keep bed in low position, maintain call bell within reach at all times, provide assistance with transfer out of bed and ambulation.  Medication Inspection Compliance: [name] did not comply with our request to bring her pills to be counted. She was reminded that bringing the medication bottles, even when empty, is a requirement.  Medication: None brought in. Pill/Patch Count: None available to be counted. Bottle Appearance: No container available. Did not bring bottle(s) to appointment. Filled Date: N/A Last Medication intake:  Ran out of medicine more than 48 hours ago    UDS:   Summary  Date Value Ref Range Status  10/06/2019 Note  Final    Comment:    ==================================================================== [employer] Select 13 (MW) ==================================================================== Test                             Result       Flag       Units Drug Present and Declared for Prescription Verification   Alprazolam                     574          EXPECTED   ng/mg creat   Alpha-hydroxyalprazolam        >1587        EXPECTED   ng/mg creat    Source of alprazolam is a scheduled prescription medication. Alpha-    hydroxyalprazolam is an expected metabolite of alprazolam.   Oxycodone                      4542         EXPECTED   ng/mg creat   Oxymorphone                    3763         EXPECTED   ng/mg creat   Noroxycodone                   >  6387        EXPECTED   ng/mg creat   Noroxymorphone                 548          EXPECTED   ng/mg creat    Sources of oxycodone are scheduled prescription medications.    Oxymorphone, noroxycodone, and noroxymorphone are expected    metabolites of oxycodone. Oxymorphone is also available as a    scheduled prescription medication. ==================================================================== Test                      Result    Flag   Units      Ref Range   Creatinine              126              mg/dL      >=20 ==================================================================== Declared Medications:  The flagging and interpretation on this report are based on the  following declared medications.  Unexpected results may arise from  inaccuracies in the declared medications.  **Note: The testing scope of this panel includes these medications:  Alprazolam  Oxycodone  **Note: The testing scope of this panel does not include the  following reported medications:  Albuterol (Combivent)  Albuterol (Duoneb)  Amlodipine  Bupropion  Escitalopram (Lexapro)  Fluticasone (Advair)  Ipratropium  (Combivent)  Ipratropium (Duoneb)  Levothyroxine (Synthroid)  Magnesium  Montelukast (Singulair)  Salmeterol (Advair)  Tizanidine  Trazodone ==================================================================== For clinical consultation, please call [phone]. ====================================================================      ROS  Constitutional: Denies any fever or chills Gastrointestinal: No reported hemesis, hematochezia, vomiting, or acute GI distress Musculoskeletal: Denies any acute onset joint swelling, redness, loss of ROM, or weakness Neurological: No reported episodes of acute onset apraxia, aphasia, dysarthria, agnosia, amnesia, paralysis, loss of coordination, or loss of consciousness  Medication Review  ALPRAZolam, Fluticasone-Salmeterol, Ipratropium-Albuterol, albuterol, apixaban, aspirin EC, atorvastatin, buPROPion, escitalopram, levothyroxine, montelukast, oxyCODONE, tizanidine, and traZODone  History Review  Allergy: Ms. Nesser has No Known Allergies. Drug: Ms. Dorner  reports no history of drug use. Alcohol:  reports no history of alcohol use. Tobacco:  reports that she has been smoking cigarettes. She has never used smokeless tobacco. Social: Ms. Koelzer  reports that she has been smoking cigarettes. She has never used smokeless tobacco. She reports that she does not drink alcohol and does not use drugs. Medical:  has a past medical history of Anxiety, CAD (coronary artery disease), Carpal tunnel syndrome, Cataract, Complication of anesthesia, COPD (chronic obstructive pulmonary disease) ([place]), CRP elevated (09/14/2015), Depression, Difficult intubation, Dyslipidemia, Dyspnea, Dysrhythmia, Elevated sedimentation rate (09/14/2015), Esophageal spasm, Gastrointestinal parasites, GERD (gastroesophageal reflux disease), Hiatal hernia, History of peptic ulcer disease, Hypertension, Hyperthyroidism, Hypothyroidism, Low magnesium levels (09/14/2015), Pelvic fracture ([place])  (2008), Peripheral vascular disease ([place]), Reflux, Rotator cuff injury, and Stenosis, spinal, lumbar. Surgical: Ms. Myung  has a past surgical history that includes Cesarean section; Neck surgery; Cholecystectomy; Carpal tunnel release; Rotator cuff repair; Shoulder arthroscopy (12/07/2011); Shoulder surgery (12/07/2011); Intramedullary (im) nail intertrochanteric (Right, 10/01/2016); Fracture surgery; Appendectomy; Nose surgery; Back surgery; Cataract extraction w/PHACO (Right, 08/07/2017); Hip surgery; Cataract extraction w/PHACO (Left, 09/04/2017); Kyphoplasty (N/A, 08/20/2018); Lower Extremity Angiography (Right, 12/18/2019); Lower Extremity Angiography (Right, 12/02/2020); Lower Extremity Angiography (Right, 12/23/2020); Lower Extremity Angiography (Right, 12/24/2020); Amputation toe (Right, 12/26/2020); Intramedullary (im) nail intertrochanteric (Left, 01/14/2021); and Amputation toe (Right, 04/22/2021). Family: family history includes Aneurysm in an other family member.  Laboratory Chemistry  Profile   Renal Lab Results  Component Value Date   BUN 14 03/21/2021   CREATININE 0.66 03/21/2021   BCR 9 (L) 10/06/2019   GFRAA >60 12/18/2019   GFRNONAA >60 03/21/2021    Hepatic Lab Results  Component Value Date   AST 19 03/21/2021   ALT 12 03/21/2021   ALBUMIN 3.2 (L) 03/21/2021   ALKPHOS 88 03/21/2021    Electrolytes Lab Results  Component Value Date   NA 140 03/21/2021   K 3.7 03/21/2021   CL 105 03/21/2021   CALCIUM 8.8 (L) 03/21/2021   MG 2.3 01/12/2021   PHOS 4.3 01/01/2021    Bone Lab Results  Component Value Date   VD25OH 14.25 (L) 01/12/2021   25OHVITD1 15 (L) 10/06/2019   25OHVITD2 <1.0 10/06/2019   25OHVITD3 14 10/06/2019    Inflammation (CRP: Acute Phase) (ESR: Chronic Phase) Lab Results  Component Value Date   CRP 1.7 (H) 12/22/2020   ESRSEDRATE 42 (H) 12/22/2020   LATICACIDVEN 1.2 12/22/2020         Note: Above Lab results reviewed.  Recent Imaging Review  DG Chest  Portable 1 View CLINICAL DATA:  Shortness of breath and COPD exacerbation.  EXAM: PORTABLE CHEST 1 VIEW  COMPARISON:  01/11/2021 and CTA of the chest on 01/13/2021  FINDINGS: The heart size and mediastinal contours are within normal limits. Chronic emphysematous lung disease again noted. New areas of parenchymal opacity in the right upper and lower lung fields are suspicious for acute pneumonia but may also represent areas of atelectasis. No edema, pneumothorax or pleural fluid identified. The visualized skeletal structures are unremarkable.  IMPRESSION: Chronic emphysema with new parenchymal opacities in the right upper and lower lungs suspicious for acute pneumonia.  Electronically Signed   By: Aletta Edouard M.D.   On: 03/21/2021 08:19 Note: Reviewed        Physical Exam  General appearance: Well nourished, well developed, and well hydrated. In no apparent acute distress Mental status: Alert, oriented x 3 (person, place, & time)       Respiratory: No evidence of acute respiratory distress Eyes: PERLA Vitals: BP (!) 158/122 (BP Location: Right Arm)   Pulse (!) 59   Temp (!) 97 F (36.1 C)   Resp 16   Ht _0  (1.626 m)   Wt 104 lb (47.2 kg)   SpO2 93%   BMI 17.85 kg/m  BMI: Estimated body mass index is 17.85 kg/m as calculated from the following:   Height as of this encounter: _1  (1.626 m).   Weight as of this encounter: 104 lb (47.2 kg). Ideal: Ideal body weight: 54.7 kg (120 lb 9.5 oz)  Assessment   Status Diagnosis  Controlled Controlled Controlled 1. Chronic low back pain (1ry area of Pain) (Right) w/o sciatica   2. Chronic neck pain (2ry area of Pain) (Bilateral) (R>L)   3. 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 Closed fracture of hip, sequela (Left)   5. Intertrochanteric fracture of right hip, sequela   6. Spinal stenosis of lumbar region with neurogenic claudication   7. Chronic pain syndrome   8.  Pharmacologic therapy   9. Chronic use of opiate for therapeutic purpose   10. Encounter for medication management      Updated Problems: No problems updated.  Plan of Care  Problem-specific:  No problem-specific Assessment & Plan notes found for this encounter.  [name] has a current medication list  which includes the following long-term medication(s): albuterol, apixaban, atorvastatin, bupropion, escitalopram, fluticasone-salmeterol, levothyroxine, oxycodone, [START ON 08/12/2021] oxycodone, [START ON 09/11/2021] oxycodone, tizanidine, and trazodone.  Pharmacotherapy (Medications Ordered): Meds ordered this encounter  Medications   oxyCODONE (OXY IR/ROXICODONE) 5 MG immediate release tablet    Sig: Take 1 tablet (5 mg total) by mouth 2 (two) times daily as needed for severe pain. Must last 30 days    Dispense:  60 tablet    Refill:  0    Not a duplicate. Do NOT delete! Dispense 1 day early if closed on fill date. Warn not to take CNS-depressants 8 hours before or after taking opioid. Do not send refill request. Renewal requires appointment.   oxyCODONE (OXY IR/ROXICODONE) 5 MG immediate release tablet    Sig: Take 1 tablet (5 mg total) by mouth 2 (two) times daily as needed for severe pain. Must last 30 days    Dispense:  60 tablet    Refill:  0    Not a duplicate. Do NOT delete! Dispense 1 day early if closed on fill date. Warn not to take CNS-depressants 8 hours before or after taking opioid. Do not send refill request. Renewal requires appointment.   oxyCODONE (OXY IR/ROXICODONE) 5 MG immediate release tablet    Sig: Take 1 tablet (5 mg total) by mouth 2 (two) times daily as needed for severe pain. Must last 30 days    Dispense:  60 tablet    Refill:  0    Not a duplicate. Do NOT delete! Dispense 1 day early if closed on fill date. Warn not to take CNS-depressants 8 hours before or after taking opioid. Do not send refill request. Renewal requires appointment.    Orders:   Orders Placed This Encounter  Procedures   [employer] Select 13 (MW), Urine    Volume: 30 ml(s). Minimum 3 ml of urine is needed. Document temperature of fresh sample. Indications: Long term (current) use of opiate analgesic (X64.680)    Order Specific Question:   Release to patient    Answer:   Immediate    Follow-up plan:   Return in about 3 months (around 10/11/2021) for Eval-day(M,W), (F2F), (MM).     Interventional management options: Considering:   Possible right SI joint RFA  Possible right lumbar facet RFA   Diagnostic bilateral cervical facet block  Possible bilateral cervical facet RFA  Diagnostic right CESI  Diagnostic bilateral IA shoulder injection  Diagnostic bilateral suprascapular NB  Possible bilateral suprascapular nerve RFA  Diagnostic left L3-4 TFESI  Diagnostic L3-4 vs L4-5 LESI    Palliative PRN treatment(s):   Diagnostic right lumbar facet + right SI joint block #2 Palliative right SI joint block Palliative right lumbar facet block    Recent Visits No visits were found meeting these conditions. Showing recent visits within past 90 days and meeting all other requirements Today's Visits Date Type Provider Dept  07/13/21 Office Visit Milinda Pointer, MD [facility]  Showing today's visits and meeting all other requirements Future Appointments Date Type Provider Dept  10/10/21 Appointment Milinda Pointer, MD [facility]  Showing future appointments within next 90 days and meeting all other requirements I discussed the assessment and treatment plan with the patient. The patient was provided an opportunity to ask questions and all were answered. The patient agreed with the plan and demonstrated an understanding of the instructions.  Patient advised to call back or seek an in-person evaluation if the symptoms or condition worsens.  Duration of encounter: 30 minutes.  Note by: Gaspar Cola, MD Date: 07/13/2021; Time:  3:33 PM

## 2021-07-13 ENCOUNTER — Ambulatory Visit: Payer: Medicare HMO | Attending: Pain Medicine | Admitting: Pain Medicine

## 2021-07-13 ENCOUNTER — Other Ambulatory Visit: Payer: Self-pay

## 2021-07-13 ENCOUNTER — Encounter: Payer: Self-pay | Admitting: Pain Medicine

## 2021-07-13 VITALS — BP 158/122 | HR 59 | Temp 97.0°F | Resp 16 | Ht 64.0 in | Wt 104.0 lb

## 2021-07-13 DIAGNOSIS — M48062 Spinal stenosis, lumbar region with neurogenic claudication: Secondary | ICD-10-CM

## 2021-07-13 DIAGNOSIS — S72141S Displaced intertrochanteric fracture of right femur, sequela: Secondary | ICD-10-CM | POA: Diagnosis present

## 2021-07-13 DIAGNOSIS — M542 Cervicalgia: Secondary | ICD-10-CM | POA: Diagnosis not present

## 2021-07-13 DIAGNOSIS — S72002S Fracture of unspecified part of neck of left femur, sequela: Secondary | ICD-10-CM

## 2021-07-13 DIAGNOSIS — M545 Low back pain, unspecified: Secondary | ICD-10-CM

## 2021-07-13 DIAGNOSIS — Z79891 Long term (current) use of opiate analgesic: Secondary | ICD-10-CM | POA: Diagnosis present

## 2021-07-13 DIAGNOSIS — G8929 Other chronic pain: Secondary | ICD-10-CM

## 2021-07-13 DIAGNOSIS — Z79899 Other long term (current) drug therapy: Secondary | ICD-10-CM

## 2021-07-13 DIAGNOSIS — M961 Postlaminectomy syndrome, not elsewhere classified: Secondary | ICD-10-CM | POA: Diagnosis not present

## 2021-07-13 DIAGNOSIS — G894 Chronic pain syndrome: Secondary | ICD-10-CM | POA: Diagnosis not present

## 2021-07-13 MED ORDER — OXYCODONE HCL 5 MG PO TABS: 5.0000 mg | ORAL_TABLET | Freq: Two times a day (BID) | ORAL | 0 refills | Status: DC | PRN

## 2021-07-13 NOTE — Patient Instructions (Signed)
____________________________________________________________________________________________  Medication Rules  Purpose: To inform patients, and their family members, of our rules and regulations.  Applies to: All patients receiving prescriptions (written or electronic).  Pharmacy of record: Pharmacy where electronic prescriptions will be sent. If written prescriptions are taken to a different pharmacy, please inform the nursing staff. The pharmacy listed in the electronic medical record should be the one where you would like electronic prescriptions to be sent.  Electronic prescriptions: In compliance with the [place] Strengthen Opioid Misuse Prevention (STOP) Act of 2017 (Session Law 2017-74/H243), effective November 20, 2018, all controlled substances must be electronically prescribed. Calling prescriptions to the pharmacy will cease to exist.  Prescription refills: Only during scheduled appointments. Applies to all prescriptions.  NOTE: The following applies primarily to controlled substances (Opioid* Pain Medications).   Type of encounter (visit): For patients receiving controlled substances, face-to-face visits are required. (Not an option or up to the patient.)  Patient's responsibilities: Pain Pills: Bring all pain pills to every appointment (except for procedure appointments). Pill Bottles: Bring pills in original pharmacy bottle. Always bring the newest bottle. Bring bottle, even if empty. Medication refills: You are responsible for knowing and keeping track of what medications you take and those you need refilled. The day before your appointment: write a list of all prescriptions that need to be refilled. The day of the appointment: give the list to the admitting nurse. Prescriptions will be written only during appointments. No prescriptions will be written on procedure days. If you forget a medication: it will not be "Called in", "Faxed", or "electronically sent". You will  need to get another appointment to get these prescribed. No early refills. Do not call asking to have your prescription filled early. Prescription Accuracy: You are responsible for carefully inspecting your prescriptions before leaving our office. Have the discharge nurse carefully go over each prescription with you, before taking them home. Make sure that your name is accurately spelled, that your address is correct. Check the name and dose of your medication to make sure it is accurate. Check the number of pills, and the written instructions to make sure they are clear and accurate. Make sure that you are given enough medication to last until your next medication refill appointment. Taking Medication: Take medication as prescribed. When it comes to controlled substances, taking less pills or less frequently than prescribed is permitted and encouraged. Never take more pills than instructed. Never take medication more frequently than prescribed.  Inform other Doctors: Always inform, all of your healthcare providers, of all the medications you take. Pain Medication from other Providers: You are not allowed to accept any additional pain medication from any other Doctor or Healthcare provider. There are two exceptions to this rule. (see below) In the event that you require additional pain medication, you are responsible for notifying us, as stated below. Cough Medicine: Often these contain an opioid, such as codeine or hydrocodone. Never accept or take cough medicine containing these opioids if you are already taking an opioid* medication. The combination may cause respiratory failure and death. Medication Agreement: You are responsible for carefully reading and following our Medication Agreement. This must be signed before receiving any prescriptions from our practice. Safely store a copy of your signed Agreement. Violations to the Agreement will result in no further prescriptions. (Additional copies of our  Medication Agreement are available upon request.) Laws, Rules, & Regulations: All patients are expected to follow all Federal and State Laws, Statutes, Rules, & Regulations. Ignorance of   the Laws does not constitute a valid excuse.  Illegal drugs and Controlled Substances: The use of illegal substances (including, but not limited to marijuana and its derivatives) and/or the illegal use of any controlled substances is strictly prohibited. Violation of this rule may result in the immediate and permanent discontinuation of any and all prescriptions being written by our practice. The use of any illegal substances is prohibited. Adopted CDC guidelines & recommendations: Target dosing levels will be at or below 60 MME/day. Use of benzodiazepines** is not recommended.  Exceptions: There are only two exceptions to the rule of not receiving pain medications from other Healthcare Providers. Exception #1 (Emergencies): In the event of an emergency (i.e.: accident requiring emergency care), you are allowed to receive additional pain medication. However, you are responsible for: As soon as you are able, call our office [phone], at any time of the day or night, and leave a message stating your name, the date and nature of the emergency, and the name and dose of the medication prescribed. In the event that your call is answered by a member of our staff, make sure to document and save the date, time, and the name of the person that took your information.  Exception #2 (Planned Surgery): In the event that you are scheduled by another doctor or dentist to have any type of surgery or procedure, you are allowed (for a period no longer than 30 days), to receive additional pain medication, for the acute post-op pain. However, in this case, you are responsible for picking up a copy of our "Post-op Pain Management for Surgeons" handout, and giving it to your surgeon or dentist. This document is available at our office, and  does not require an appointment to obtain it. Simply go to our office during business hours (Monday-Thursday from 8:00 AM to 4:00 PM) (Friday 8:00 AM to 12:00 Noon) or if you have a scheduled appointment with us, prior to your surgery, and ask for it by name. In addition, you are responsible for: calling our office [phone], at any time of the day or night, and leaving a message stating your name, name of your surgeon, type of surgery, and date of procedure or surgery. Failure to comply with your responsibilities may result in termination of therapy involving the controlled substances.  *Opioid medications include: morphine, codeine, oxycodone, oxymorphone, hydrocodone, hydromorphone, meperidine, tramadol, tapentadol, buprenorphine, fentanyl, methadone. **Benzodiazepine medications include: diazepam (Valium), alprazolam (Xanax), clonazepam (Klonopine), lorazepam (Ativan), clorazepate (Tranxene), chlordiazepoxide (Librium), estazolam (Prosom), oxazepam (Serax), temazepam (Restoril), triazolam (Halcion) (Last updated: 10/18/2020) ____________________________________________________________________________________________  ____________________________________________________________________________________________  Medication Recommendations and Reminders  Applies to: All patients receiving prescriptions (written and/or electronic).  Medication Rules & Regulations: These rules and regulations exist for your safety and that of others. They are not flexible and neither are we. Dismissing or ignoring them will be considered "non-compliance" with medication therapy, resulting in complete and irreversible termination of such therapy. (See document titled "Medication Rules" for more details.) In all conscience, because of safety reasons, we cannot continue providing a therapy where the patient does not follow instructions.  Pharmacy of record:  Definition: This is the pharmacy where your electronic  prescriptions will be sent.  We do not endorse any particular pharmacy, however, we have experienced problems with [facility] not securing enough medication supply for the community. We do not restrict you in your choice of pharmacy. However, once we write for your prescriptions, we will NOT be re-sending more prescriptions to fix restricted supply problems   created by your pharmacy, or your insurance.  The pharmacy listed in the electronic medical record should be the one where you want electronic prescriptions to be sent. If you choose to change pharmacy, simply notify our nursing staff.  Recommendations: Keep all of your pain medications in a safe place, under lock and key, even if you live alone. We will NOT replace lost, stolen, or damaged medication. After you fill your prescription, take 1 week's worth of pills and put them away in a safe place. You should keep a separate, properly labeled bottle for this purpose. The remainder should be kept in the original bottle. Use this as your primary supply, until it runs out. Once it's gone, then you know that you have 1 week's worth of medicine, and it is time to come in for a prescription refill. If you do this correctly, it is unlikely that you will ever run out of medicine. To make sure that the above recommendation works, it is very important that you make sure your medication refill appointments are scheduled at least 1 week before you run out of medicine. To do this in an effective manner, make sure that you do not leave the office without scheduling your next medication management appointment. Always ask the nursing staff to show you in your prescription , when your medication will be running out. Then arrange for the receptionist to get you a return appointment, at least 7 days before you run out of medicine. Do not wait until you have 1 or 2 pills left, to come in. This is very poor planning and does not take into consideration that we may need to  cancel appointments due to bad weather, sickness, or emergencies affecting our staff. DO NOT ACCEPT A "Partial Fill": If for any reason your pharmacy does not have enough pills/tablets to completely fill or refill your prescription, do not allow for a "partial fill". The law allows the pharmacy to complete that prescription within 72 hours, without requiring a new prescription. If they do not fill the rest of your prescription within those 72 hours, you will need a separate prescription to fill the remaining amount, which we will NOT provide. If the reason for the partial fill is your insurance, you will need to talk to the pharmacist about payment alternatives for the remaining tablets, but again, DO NOT ACCEPT A PARTIAL FILL, unless you can trust your pharmacist to obtain the remainder of the pills within 72 hours.  Prescription refills and/or changes in medication(s):  Prescription refills, and/or changes in dose or medication, will be conducted only during scheduled medication management appointments. (Applies to both, written and electronic prescriptions.) No refills on procedure days. No medication will be changed or started on procedure days. No changes, adjustments, and/or refills will be conducted on a procedure day. Doing so will interfere with the diagnostic portion of the procedure. No phone refills. No medications will be "called into the pharmacy". No Fax refills. No weekend refills. No Holliday refills. No after hours refills.  Remember:  Business hours are:  Monday to Thursday 8:00 AM to 4:00 PM Provider's Schedule: Margery Szostak, MD - Appointments are:  Medication management: Monday and Wednesday 8:00 AM to 4:00 PM Procedure day: Tuesday and Thursday 7:30 AM to 4:00 PM Bilal Lateef, MD - Appointments are:  Medication management: Tuesday and Thursday 8:00 AM to 4:00 PM Procedure day: Monday and Wednesday 7:30 AM to 4:00 PM (Last update:  06/09/2020) ____________________________________________________________________________________________  ____________________________________________________________________________________________  CBD (cannabidiol) WARNING    Applicable to: All individuals currently taking or considering taking CBD (cannabidiol) and, more important, all patients taking opioid analgesic controlled substances (pain medication). (Example: oxycodone; oxymorphone; hydrocodone; hydromorphone; morphine; methadone; tramadol; tapentadol; fentanyl; buprenorphine; butorphanol; dextromethorphan; meperidine; codeine; etc.)  Legal status: CBD remains a Schedule I drug prohibited for any use. CBD is illegal with one exception. In the [place], CBD has a limited Food and Drug Administration (FDA) approval for the treatment of two specific types of epilepsy disorders. Only one CBD product has been approved by the FDA for this purpose: "Epidiolex". FDA is aware that some companies are marketing products containing cannabis and cannabis-derived compounds in ways that violate the Federal Food, Drug and Cosmetic Act (FD&C Act) and that may put the health and safety of consumers at risk. The FDA, a Federal agency, has not enforced the CBD status since 2018.   Legality: Some manufacturers ship CBD products nationally, which is illegal. Often such products are sold online and are therefore available throughout the country. CBD is openly sold in head shops and health food stores in some states where such sales have not been explicitly legalized. Selling unapproved products with unsubstantiated therapeutic claims is not only a violation of the law, but also can put patients at risk, as these products have not been proven to be safe or effective. Federal illegality makes it difficult to conduct research on CBD.  Reference: "FDA Regulation of Cannabis and Cannabis-Derived Products, Including Cannabidiol (CBD)" -  https://www.fda.gov/news-events/public-health-focus/fda-regulation-cannabis-and-cannabis-derived-products-including-cannabidiol-cbd  Warning: CBD is not FDA approved and has not undergo the same manufacturing controls as prescription drugs.  This means that the purity and safety of available CBD may be questionable. Most of the time, despite manufacturer's claims, it is contaminated with THC (delta-9-tetrahydrocannabinol - the chemical in marijuana responsible for the "HIGH").  When this is the case, the THC contaminant will trigger a positive urine drug screen (UDS) test for Marijuana (carboxy-THC). Because a positive UDS for any illicit substance is a violation of our medication agreement, your opioid analgesics (pain medicine) may be permanently discontinued.  MORE ABOUT CBD  General Information: CBD  is a derivative of the Marijuana (cannabis sativa) plant discovered in 1940. It is one of the 113 identified substances found in Marijuana. It accounts for up to 40% of the plant's extract. As of 2018, preliminary clinical studies on CBD included research for the treatment of anxiety, movement disorders, and pain. CBD is available and consumed in multiple forms, including inhalation of smoke or vapor, as an aerosol spray, and by mouth. It may be supplied as an oil containing CBD, capsules, dried cannabis, or as a liquid solution. CBD is thought not to be as psychoactive as THC (delta-9-tetrahydrocannabinol - the chemical in marijuana responsible for the "HIGH"). Studies suggest that CBD may interact with different biological target receptors in the body, including cannabinoid and other neurotransmitter receptors. As of 2018 the mechanism of action for its biological effects has not been determined.  Side-effects  Adverse reactions: Dry mouth, diarrhea, decreased appetite, fatigue, drowsiness, malaise, weakness, sleep disturbances, and others.  Drug interactions: CBC may interact with other medications  such as blood-thinners. (Last update: 06/26/2020) ____________________________________________________________________________________________  ____________________________________________________________________________________________  Drug Holidays (Slow)  What is a "Drug Holiday"? Drug Holiday: is the name given to the period of time during which a patient stops taking a medication(s) for the purpose of eliminating tolerance to the drug.  Benefits Improved effectiveness of opioids. Decreased opioid dose needed to achieve benefits. Improved pain with lesser dose.    What is tolerance? Tolerance: is the progressive decreased in effectiveness of a drug due to its repetitive use. With repetitive use, the body gets use to the medication and as a consequence, it loses its effectiveness. This is a common problem seen with opioid pain medications. As a result, a larger dose of the drug is needed to achieve the same effect that used to be obtained with a smaller dose.  How long should a "Drug Holiday" last? You should stay off of the pain medicine for at least 14 consecutive days. (2 weeks)  Should I stop the medicine "cold turkey"? No. You should always coordinate with your Pain Specialist so that he/she can provide you with the correct medication dose to make the transition as smoothly as possible.  How do I stop the medicine? Slowly. You will be instructed to decrease the daily amount of pills that you take by one (1) pill every seven (7) days. This is called a "slow downward taper" of your dose. For example: if you normally take four (4) pills per day, you will be asked to drop this dose to three (3) pills per day for seven (7) days, then to two (2) pills per day for seven (7) days, then to one (1) per day for seven (7) days, and at the end of those last seven (7) days, this is when the "Drug Holiday" would start.   Will I have withdrawals? By doing a "slow downward taper" like this one, it  is unlikely that you will experience any significant withdrawal symptoms. Typically, what triggers withdrawals is the sudden stop of a high dose opioid therapy. Withdrawals can usually be avoided by slowly decreasing the dose over a prolonged period of time. If you do not follow these instructions and decide to stop your medication abruptly, withdrawals may be possible.  What are withdrawals? Withdrawals: refers to the wide range of symptoms that occur after stopping or dramatically reducing opiate drugs after heavy and prolonged use. Withdrawal symptoms do not occur to patients that use low dose opioids, or those who take the medication sporadically. Contrary to benzodiazepine (example: Valium, Xanax, etc.) or alcohol withdrawals ("Delirium Tremens"), opioid withdrawals are not lethal. Withdrawals are the physical manifestation of the body getting rid of the excess receptors.  Expected Symptoms Early symptoms of withdrawal may include: Agitation Anxiety Muscle aches Increased tearing Insomnia Runny nose Sweating Yawning  Late symptoms of withdrawal may include: Abdominal cramping Diarrhea Dilated pupils Goose bumps Nausea Vomiting  Will I experience withdrawals? Due to the slow nature of the taper, it is very unlikely that you will experience any.  What is a slow taper? Taper: refers to the gradual decrease in dose.  (Last update: 06/09/2020) ____________________________________________________________________________________________    

## 2021-07-13 NOTE — Progress Notes (Signed)
Nursing Pain Medication Assessment:  Safety precautions to be maintained throughout the outpatient stay will include: orient to surroundings, keep bed in low position, maintain call bell within reach at all times, provide assistance with transfer out of bed and ambulation.  Medication Inspection Compliance: [name] did not comply with our request to bring her pills to be counted. She was reminded that bringing the medication bottles, even when empty, is a requirement.  Medication: None brought in. Pill/Patch Count: None available to be counted. Bottle Appearance: No container available. Did not bring bottle(s) to appointment. Filled Date: N/A Last Medication intake:  Ran out of medicine more than 48 hours ago

## 2021-07-16 LAB — TOXASSURE SELECT 13 (MW), URINE

## 2021-10-09 NOTE — Progress Notes (Addendum)
Patient: [name]  Service Category: E/M  Provider: Gaspar Cola, MD  DOB: [birth year]  DOS: 10/10/2021  Location: Office  MRN: [id number]  Setting: Ambulatory outpatient  Referring Provider: Albina Billet, MD  Type: Established Patient  Specialty: Interventional Pain Management  PCP: Alison Billet, MD  Location: Remote location  Delivery: TeleHealth     Virtual Encounter - Pain Management PROVIDER NOTE: Information contained herein reflects review and annotations entered in association with encounter. Interpretation of such information and data should be left to medically-trained personnel. Information provided to patient can be located elsewhere in the medical record under "Patient Instructions". Document created using STT-dictation technology, any transcriptional errors that may result from process are unintentional.    Contact & Pharmacy Preferred: [phone] Home: [phone] (home) Mobile: [phone] (mobile) E-mail: No e-mail address on record  [id number] [place] [id number]: [phone]: [address]: [phone]: [phone]  Pre-screening  [name] offered "in-person" vs "virtual" encounter. She indicated preferring virtual for this encounter.   Reason COVID-19*  Social distancing based on CDC and AMA recommendations.   I contacted AGlendell Dockeron 10/10/2021 via telephone.      I clearly identified myself as FGaspar Cola MD. I verified that I was speaking with the correct person using two identifiers (Name: [name] and date of birth: 611/11/1945.  Consent I sought verbal advanced consent from AGlendell Dockerfor virtual visit interactions. I informed Ms. PWeisbergof possible security and privacy concerns, risks, and limitations associated with providing "not-in-person" medical  evaluation and management services. I also informed Ms. PBobackof the availability of "in-person" appointments. Finally, I informed her that there would be a charge for the virtual visit and that she could be  personally, fully or partially, financially responsible for it. Ms. PFedorkoexpressed understanding and agreed to proceed.   Historic Elements   Ms. AGlendell Dockeris a 75y.o. year old, female patient evaluated today after our last contact on 07/13/2021. [name] has a past medical history of Anxiety, CAD (coronary artery disease), Carpal tunnel syndrome, Cataract, Complication of anesthesia, COPD (chronic obstructive pulmonary disease) ([place], CRP elevated (09/14/2015), Depression, Difficult intubation, Dyslipidemia, Dyspnea, Dysrhythmia, Elevated sedimentation rate (09/14/2015), Esophageal spasm, Gastrointestinal parasites, GERD (gastroesophageal reflux disease), Hiatal hernia, History of peptic ulcer disease, Hypertension, Hyperthyroidism, Hypothyroidism, Low magnesium levels (09/14/2015), [place] Peripheral vascular disease ([place], Reflux, Rotator cuff injury, and Stenosis, spinal, lumbar. She also  has a past surgical history that includes Cesarean section; Neck surgery; Cholecystectomy; Carpal tunnel release; Rotator cuff repair; Shoulder arthroscopy (12/07/2011); Shoulder surgery (12/07/2011); Intramedullary (im) nail intertrochanteric (Right, 10/01/2016); Fracture surgery; Appendectomy; Nose surgery; Back surgery; Cataract extraction w/PHACO (Right, 08/07/2017); Hip surgery; Cataract extraction w/PHACO (Left, 09/04/2017); Kyphoplasty (N/A, 08/20/2018); Lower Extremity Angiography (Right, 12/18/2019); Lower Extremity Angiography (Right, 12/02/2020); Lower Extremity Angiography (Right, 12/23/2020); Lower Extremity Angiography (Right, 12/24/2020); Amputation toe (Right, 12/26/2020); Intramedullary (im) nail intertrochanteric (Left, 01/14/2021); and Amputation toe (Right, 04/22/2021). Ms. PDuquettehas a  current medication list which includes the following prescription(s): [START ON 10/11/2021] oxycodone, albuterol, alprazolam, apixaban, aspirin ec, atorvastatin, bupropion, combivent respimat, escitalopram, fluticasone-salmeterol, levothyroxine, montelukast, tizanidine, and trazodone. She  reports that she has been smoking cigarettes. She has never used smokeless tobacco. She reports that she does not  drink alcohol and does not use drugs. [name] has No Known Allergies.   HPI  Today, she is being contacted for medication management.  The patient again indicates that she is unable to come in for her appointment due to transportation problems.  According to the PMP the last prescription she had filled was on 09/11/2021.  Pharmacotherapy Assessment   Analgesic: Oxycodone IR 5 mg, 1 tab PO BID (10 mg/day of oxycodone) MME/day: 15 mg/day.   Monitoring: [place] PMP: PDMP reviewed during this encounter.       Pharmacotherapy: No side-effects or adverse reactions reported. Compliance: No problems identified. Effectiveness: Clinically acceptable. Plan: Refer to "POC". UDS:  Summary  Date Value Ref Range Status  07/13/2021 Note  Final    Comment:    ==================================================================== [employer] Select 13 (MW) ==================================================================== Test                             Result       Flag       Units  Drug Present and Declared for Prescription Verification   Alprazolam                     207          EXPECTED   ng/mg creat   Alpha-hydroxyalprazolam        977          EXPECTED   ng/mg creat    Source of alprazolam is a scheduled prescription medication. Alpha-    hydroxyalprazolam is an expected metabolite of alprazolam.  Drug Absent but Declared for Prescription Verification   Oxycodone                      Not Detected UNEXPECTED ng/mg creat ==================================================================== Test                       Result    Flag   Units      Ref Range   Creatinine              149              mg/dL      >=20 ==================================================================== Declared Medications:  The flagging and interpretation on this report are based on the  following declared medications.  Unexpected results may arise from  inaccuracies in the declared medications.   **Note: The testing scope of this panel includes these medications:   Alprazolam (Xanax)  Oxycodone (Roxicodone)  Oxycodone (Percocet)   **Note: The testing scope of this panel does not include the  following reported medications:   Acetaminophen (Percocet)  Albuterol (Ventolin HFA)  Albuterol (Combivent)  Amlodipine (Norvasc)  Apixaban (Eliquis)  Aspirin  Atorvastatin (Lipitor)  Bupropion (Wellbutrin SR)  Escitalopram (Lexapro)  Fluticasone (Advair)  Gabapentin (Neurontin)  Ipratropium (Combivent)  Levothyroxine (Synthroid)  Montelukast (Singulair)  Salmeterol (Advair)  Tizanidine (Zanaflex)  Trazodone (Desyrel) ==================================================================== For clinical consultation, please call [phone]. ====================================================================      Laboratory Chemistry Profile   Renal Lab Results  Component Value Date   BUN 14 03/21/2021   CREATININE 0.66 03/21/2021   BCR 9 (L) 10/06/2019   GFRAA >60 12/18/2019   GFRNONAA >60 03/21/2021    Hepatic Lab Results  Component Value Date   AST 19 03/21/2021   ALT 12 03/21/2021   ALBUMIN 3.2 (L) 03/21/2021   ALKPHOS 88 03/21/2021    Electrolytes  Lab Results  Component Value Date   NA 140 03/21/2021   K 3.7 03/21/2021   CL 105 03/21/2021   CALCIUM 8.8 (L) 03/21/2021   MG 2.3 01/12/2021   PHOS 4.3 01/01/2021    Bone Lab Results  Component Value Date   VD25OH 14.25 (L) 01/12/2021   25OHVITD1 15 (L) 10/06/2019   25OHVITD2 <1.0 10/06/2019   25OHVITD3 14 10/06/2019     Inflammation (CRP: Acute Phase) (ESR: Chronic Phase) Lab Results  Component Value Date   CRP 1.7 (H) 12/22/2020   ESRSEDRATE 42 (H) 12/22/2020   LATICACIDVEN 1.2 12/22/2020         Note: Above Lab results reviewed.  Imaging  DG Chest Portable 1 View CLINICAL DATA:  Shortness of breath and COPD exacerbation.  EXAM: PORTABLE CHEST 1 VIEW  COMPARISON:  01/11/2021 and CTA of the chest on 01/13/2021  FINDINGS: The heart size and mediastinal contours are within normal limits. Chronic emphysematous lung disease again noted. New areas of parenchymal opacity in the right upper and lower lung fields are suspicious for acute pneumonia but may also represent areas of atelectasis. No edema, pneumothorax or pleural fluid identified. The visualized skeletal structures are unremarkable.  IMPRESSION: Chronic emphysema with new parenchymal opacities in the right upper and lower lungs suspicious for acute pneumonia.  Electronically Signed   By: Aletta Edouard M.D.   On: 03/21/2021 08:19  Assessment  Diagnoses of Chronic pain syndrome, Pharmacologic therapy, Chronic use of opiate for therapeutic purpose, and Encounter for medication management were pertinent to this visit.  Plan of Care  Problem-specific:  No problem-specific Assessment & Plan notes found for this encounter.  [name] has a current medication list which includes the following long-term medication(s): [START ON 10/11/2021] oxycodone, albuterol, apixaban, atorvastatin, bupropion, escitalopram, fluticasone-salmeterol, levothyroxine, tizanidine, and trazodone.  Pharmacotherapy (Medications Ordered): Meds ordered this encounter  Medications   oxyCODONE (OXY IR/ROXICODONE) 5 MG immediate release tablet    Sig: Take 1 tablet (5 mg total) by mouth 2 (two) times daily as needed for severe pain. Must last 30 days    Dispense:  60 tablet    Refill:  0    DO NOT: delete (not duplicate); no partial-fill (will deny script  to complete), no refill request (F/U required). DISPENSE: 1 day early if closed on fill date. WARN: No CNS-depressants within 8 hrs of med.    Orders:  No orders of the defined types were placed in this encounter.  Follow-up plan:   Return in about 31 days (around 11/10/2021) for Eval-day (M,W), (F2F), (MM).     Interventional management options: Considering:   Possible right SI joint RFA  Possible right lumbar facet RFA   Diagnostic bilateral cervical facet block  Possible bilateral cervical facet RFA  Diagnostic right CESI  Diagnostic bilateral IA shoulder injection  Diagnostic bilateral suprascapular NB  Possible bilateral suprascapular nerve RFA  Diagnostic left L3-4 TFESI  Diagnostic L3-4 vs L4-5 LESI    Palliative PRN treatment(s):   Diagnostic right lumbar facet + right SI joint block #2 Palliative right SI joint block Palliative right lumbar facet block     Recent Visits Date Type Provider Dept  07/13/21 Office Visit Milinda Pointer, MD [facility]  Showing recent visits within past 90 days and meeting all other requirements Today's Visits Date Type Provider Dept  10/10/21 Office Visit Milinda Pointer, MD [facility]  Showing today's visits and meeting all other requirements Future Appointments No visits were  found meeting these conditions. Showing future appointments within next 90 days and meeting all other requirements I discussed the assessment and treatment plan with the patient. The patient was provided an opportunity to ask questions and all were answered. The patient agreed with the plan and demonstrated an understanding of the instructions.  Patient advised to call back or seek an in-person evaluation if the symptoms or condition worsens.  Duration of encounter: 12 minutes.  Note by: Alison Cola, MD Date: 10/10/2021; Time: 4:00 PM

## 2021-10-10 ENCOUNTER — Other Ambulatory Visit: Payer: Self-pay

## 2021-10-10 ENCOUNTER — Ambulatory Visit: Payer: Medicare HMO | Attending: Pain Medicine | Admitting: Pain Medicine

## 2021-10-10 DIAGNOSIS — Z79891 Long term (current) use of opiate analgesic: Secondary | ICD-10-CM

## 2021-10-10 DIAGNOSIS — G894 Chronic pain syndrome: Secondary | ICD-10-CM

## 2021-10-10 DIAGNOSIS — Z79899 Other long term (current) drug therapy: Secondary | ICD-10-CM | POA: Diagnosis not present

## 2021-10-10 MED ORDER — OXYCODONE HCL 5 MG PO TABS: 5.0000 mg | ORAL_TABLET | Freq: Two times a day (BID) | ORAL | 0 refills | Status: DC | PRN

## 2021-10-16 ENCOUNTER — Other Ambulatory Visit: Payer: Self-pay

## 2021-10-16 ENCOUNTER — Emergency Department: Payer: Medicare HMO

## 2021-10-16 ENCOUNTER — Emergency Department
Admission: EM | Admit: 2021-10-16 | Discharge: 2021-10-16 | Disposition: A | Discharge status: Home / Self Care | Payer: Medicare HMO | Source: Home / Self Care

## 2021-10-16 DIAGNOSIS — Z7982 Long term (current) use of aspirin: Secondary | ICD-10-CM | POA: Insufficient documentation

## 2021-10-16 DIAGNOSIS — I2510 Atherosclerotic heart disease of native coronary artery without angina pectoris: Secondary | ICD-10-CM | POA: Insufficient documentation

## 2021-10-16 DIAGNOSIS — J441 Chronic obstructive pulmonary disease with (acute) exacerbation: Secondary | ICD-10-CM

## 2021-10-16 DIAGNOSIS — Z79899 Other long term (current) drug therapy: Secondary | ICD-10-CM | POA: Insufficient documentation

## 2021-10-16 DIAGNOSIS — F17210 Nicotine dependence, cigarettes, uncomplicated: Secondary | ICD-10-CM | POA: Insufficient documentation

## 2021-10-16 DIAGNOSIS — I10 Essential (primary) hypertension: Secondary | ICD-10-CM | POA: Insufficient documentation

## 2021-10-16 DIAGNOSIS — E039 Hypothyroidism, unspecified: Secondary | ICD-10-CM | POA: Insufficient documentation

## 2021-10-16 DIAGNOSIS — I70261 Atherosclerosis of native arteries of extremities with gangrene, right leg: Secondary | ICD-10-CM | POA: Diagnosis not present

## 2021-10-16 DIAGNOSIS — Z7901 Long term (current) use of anticoagulants: Secondary | ICD-10-CM | POA: Insufficient documentation

## 2021-10-16 DIAGNOSIS — R0602 Shortness of breath: Secondary | ICD-10-CM | POA: Diagnosis not present

## 2021-10-16 LAB — COMPREHENSIVE METABOLIC PANEL
ALT: 50 U/L — ABNORMAL HIGH (ref 0–44)
AST: 25 U/L (ref 15–41)
Albumin: 3.7 g/dL (ref 3.5–5.0)
Alkaline Phosphatase: 134 U/L — ABNORMAL HIGH (ref 38–126)
Anion gap: 9 (ref 5–15)
BUN: 16 mg/dL (ref 8–23)
CO2: 24 mmol/L (ref 22–32)
Calcium: 9 mg/dL (ref 8.9–10.3)
Chloride: 105 mmol/L (ref 98–111)
Creatinine, Ser: 0.63 mg/dL (ref 0.44–1.00)
GFR, Estimated: >60 mL/min (ref >60)
Glucose, Bld: 122 mg/dL — ABNORMAL HIGH (ref 70–99)
Potassium: 3.9 mmol/L (ref 3.5–5.1)
Sodium: 138 mmol/L (ref 135–145)
Total Bilirubin: 0.7 mg/dL (ref 0.3–1.2)
Total Protein: 6.4 g/dL — ABNORMAL LOW (ref 6.5–8.1)

## 2021-10-16 LAB — CBC
HCT: 37.6 % (ref 36.0–46.0)
Hemoglobin: 12.4 g/dL (ref 12.0–15.0)
MCH: 28.8 pg (ref 26.0–34.0)
MCHC: 33 g/dL (ref 30.0–36.0)
MCV: 87.4 fL (ref 80.0–100.0)
Platelets: 461 10*3/uL — ABNORMAL HIGH (ref 150–400)
RBC: 4.3 MIL/uL (ref 3.87–5.11)
RDW: 15.8 % — ABNORMAL HIGH (ref 11.5–15.5)
WBC: 10.3 10*3/uL (ref 4.0–10.5)
nRBC: 0 % (ref 0.0–0.2)

## 2021-10-16 LAB — TROPONIN I (HIGH SENSITIVITY): Troponin I (High Sensitivity): 9 ng/L (ref <18)

## 2021-10-16 MED ORDER — COMBIVENT RESPIMAT 20-100 MCG/ACT IN AERS: 1.0000 | INHALATION_SPRAY | Freq: Four times a day (QID) | RESPIRATORY_TRACT | 1 refills | Status: DC

## 2021-10-16 MED ORDER — ALBUTEROL SULFATE HFA 108 (90 BASE) MCG/ACT IN AERS: 2.0000 | INHALATION_SPRAY | Freq: Four times a day (QID) | RESPIRATORY_TRACT | 2 refills | Status: DC | PRN

## 2021-10-16 MED ORDER — PREDNISONE 50 MG PO TABS: 50.0000 mg | ORAL_TABLET | Freq: Every day | ORAL | 0 refills | Status: DC

## 2021-10-16 MED ORDER — OXYCODONE HCL 5 MG PO TABS
10.0000 mg | ORAL_TABLET | Freq: Once | ORAL | Status: AC
Start: 2021-10-16 — End: 2021-10-16
  Administered 2021-10-16: 09:00:00 10 mg via ORAL
  Filled 2021-10-16: qty 2

## 2021-10-16 MED ORDER — ALBUTEROL SULFATE (2.5 MG/3ML) 0.083% IN NEBU: 2.5000 mg | INHALATION_SOLUTION | RESPIRATORY_TRACT | 2 refills | Status: DC | PRN

## 2021-10-16 NOTE — ED Notes (Signed)
Pt yelling out in lobby, asking repeatedly what time it is, what she is waiting for, and stating that she wants to go home and that "they can keep everything else, I just want my dog". Gabby, EDT did attempt to call patients family without success. Pt has been informed several times by staff that she is waiting to be seen by the EDP.

## 2021-10-16 NOTE — ED Triage Notes (Addendum)
BIB with EMS for SOB that started around 0300. Hx of CPOD. Exp wheezes per EMS, Gave Duo neb. Alert and oriented x4. Recent removal of several toes on right foot. Denies fevers.

## 2021-10-16 NOTE — ED Provider Notes (Signed)
[facility] Emergency Department Provider Note   ____________________________________________   None    (approximate)  I have reviewed the triage vital signs and the nursing notes.   HISTORY  Chief Complaint Shortness of Breath    HPI [name] is a 75 y.o. female who presents for shortness of breath  LOCATION: Chest DURATION: 1 day prior to arrival TIMING: Worsening since onset SEVERITY: Moderate QUALITY: Shortness of breath CONTEXT: Patient states history of COPD and worsening shortness of breath with cough over the last 24 hours MODIFYING FACTORS: Any exertion worsens the shortness of breath and is worse relieved at rest and with nebulizer treatments of albuterol ASSOCIATED SYMPTOMS: Chest tightness   Per medical record review, patient has history of anxiety/depression, COPD, and CAD          Past Medical History:  Diagnosis Date   Anxiety    CAD (coronary artery disease)    Carpal tunnel syndrome    Cataract    Complication of anesthesia    has woken up at times   COPD (chronic obstructive pulmonary disease) (HCC)    CRP elevated 09/14/2015   Depression    Difficult intubation    Dyslipidemia    Dyspnea    DOE   Dysrhythmia    hx palpatations   Elevated sedimentation rate 09/14/2015   Esophageal spasm    Gastrointestinal parasites    GERD (gastroesophageal reflux disease)    Hiatal hernia    History of peptic ulcer disease    Hypertension    Hyperthyroidism    Hypothyroidism    Low magnesium levels 09/14/2015   Pelvic fracture ([place]) 2008   fall from riding a horse   Peripheral vascular disease ([place])    Reflux    Rotator cuff injury    Stenosis, spinal, lumbar     Patient Active Problem List   Diagnosis Date Noted   Chronic hip pain (Left) 02/17/2021   Hip pain, acute (Left) 02/17/2021   Chronic use of opiate for therapeutic purpose 02/16/2021   Closed fracture of hip, sequela (Left) 02/07/2021   Fracture  of femoral neck, left, closed ([place]) 01/11/2021   Chronic respiratory failure with hypoxia ([place]) 01/11/2021   Hypokalemia 01/11/2021   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 ([place]) 12/22/2020   PAD (peripheral artery disease) ([place]) 12/22/2020   Encounter for long-term opiate analgesic use 11/11/2020   Dry gangrene ([place]) of middle toe, right foot 11/11/2020   Foot pain, right 11/11/2020   Hard of hearing 11/11/2020   Atherosclerosis of native arteries of the extremities with gangrene ([place]) 12/09/2019   Vitamin D deficiency 11/12/2019   Pharmacologic therapy 06/04/2019   Disorder of skeletal system 06/04/2019   Problems influencing health status 06/04/2019   Sepsis ([place]) 10/22/2018   HCAP (healthcare-associated pneumonia) 10/22/2018   Compression fracture of L3 vertebra ([place]) 08/19/2018   Acute UTI 08/18/2018   Chronic hip pain (Bilateral) 12/31/2017   Neurogenic pain 08/29/2017   Chronic low back pain (1ry area of Pain) (Right) w/o sciatica 08/29/2017   Chronic sacroiliac joint pain (Right) 06/05/2017   Vitamin D insufficiency 03/12/2017   Right hip pain 02/20/2017   Intertrochanteric fracture of right hip, sequela 02/20/2017   B12 deficiency 02/05/2017   Pressure injury of skin 10/02/2016   Closed displaced intertrochanteric fracture of right femur ([place]) 10/02/2016   Hip fracture ([place]) 10/01/2016   Cervical facet hypertrophy (Bilateral) 05/27/2016   History of shoulder surgery 5 (Right) 05/27/2016  Lumbar foraminal stenosis (L3-4) (Left) 05/27/2016   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) 05/27/2016   Lumbar facet hypertrophy (Bilateral) 05/27/2016   Lumbar facet syndrome (Right) 05/27/2016   Lumbar grade 1 Anterolisthesis of L3 over L4 05/27/2016   Chronic shoulder pain (Bilateral) (status post multiple surgeries) (R>L) 12/08/2015   Substance use disorder Risk: High 09/14/2015   Chronic pain syndrome 09/14/2015   Cervical spondylosis 09/14/2015   Chronic neck pain  (2ry area of Pain) (Bilateral) (R>L) 09/14/2015   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) 09/14/2015   Cervical facet syndrome (Bilateral) (R>L) 09/14/2015   Cervical myofascial pain syndrome 09/14/2015   Lumbar spondylosis 09/14/2015   Chronic shoulder impingement syndrome (Right) 09/14/2015   Hypomagnesemia 09/14/2015   CRP elevated 09/14/2015   Elevated sedimentation rate 09/14/2015   Chronic obstructive pulmonary disease (COPD) ([place]) 09/14/2015   Nicotine dependence 09/14/2015   Chronic shoulder pain (Right) 09/14/2015   Abnormal nerve conduction studies 09/14/2015   Encounter for therapeutic drug level monitoring 09/09/2015   Long term current use of opiate analgesic 09/09/2015   Long term prescription opiate use 40/06/6760   Uncomplicated opioid dependence ([place]) 09/09/2015   Opiate use 09/09/2015   Anxiety and depression 07/11/2012   Tobacco use disorder 05/27/2011   Fatigue 05/27/2011   Hypothyroidism 11/25/2010   HLD (hyperlipidemia) 08/24/2010   Tachycardia 08/24/2010   DYSPNEA 08/24/2010    Past Surgical History:  Procedure Laterality Date   AMPUTATION TOE Right 12/26/2020   Procedure: AMPUTATION TOE-3rd Toes;  Surgeon: Samara Deist, DPM;  Location: [facility];  Service: Podiatry;  Laterality: Right;   AMPUTATION TOE Right 04/22/2021   Procedure: AMPUTATION TOE- RIGHT 2ND;  Surgeon: Samara Deist, DPM;  Location: [facility];  Service: Podiatry;  Laterality: Right;   APPENDECTOMY     BACK SURGERY     CERVICAL FUSION   CARPAL TUNNEL RELEASE     CATARACT EXTRACTION W/PHACO Right 08/07/2017   Procedure: CATARACT EXTRACTION PHACO AND INTRAOCULAR LENS PLACEMENT ([place]);  Surgeon: Birder Robson, MD;  Location: [facility];  Service: Ophthalmology;  Laterality: Right;  [place] 00:52.0 AP% 16.8 CDE 8.74 Fluid Pack Lot # [id number]   CATARACT EXTRACTION W/PHACO Left 09/04/2017   Procedure: CATARACT EXTRACTION PHACO AND INTRAOCULAR LENS PLACEMENT ([place]);   Surgeon: Birder Robson, MD;  Location: [facility];  Service: Ophthalmology;  Laterality: Left;  [place] 00:34 AP% 17.0 CDE 5.80 Fluid pack lot # [id number]   CESAREAN SECTION     CHOLECYSTECTOMY     FRACTURE SURGERY     HIP SURGERY     INTRAMEDULLARY (IM) NAIL INTERTROCHANTERIC Right 10/01/2016   Procedure: INTRAMEDULLARY (IM) NAIL INTERTROCHANTRIC;  Surgeon: Corky Mull, MD;  Location: [facility];  Service: Orthopedics;  Laterality: Right;   INTRAMEDULLARY (IM) NAIL INTERTROCHANTERIC Left 01/14/2021   Procedure: INTRAMEDULLARY (IM) NAIL INTERTROCHANTRIC;  Surgeon: Lovell Sheehan, MD;  Location: [facility];  Service: Orthopedics;  Laterality: Left;   KYPHOPLASTY N/A 08/20/2018   Procedure: [id number];  Surgeon: Hessie Knows, MD;  Location: [facility];  Service: Orthopedics;  Laterality: N/A;   LOWER EXTREMITY ANGIOGRAPHY Right 12/18/2019   Procedure: LOWER EXTREMITY ANGIOGRAPHY;  Surgeon: Algernon Huxley, MD;  Location: [place] [facility];  Service: Cardiovascular;  Laterality: Right;   LOWER EXTREMITY ANGIOGRAPHY Right 12/02/2020   Procedure: LOWER EXTREMITY ANGIOGRAPHY;  Surgeon: Algernon Huxley, MD;  Location: [place] [facility];  Service: Cardiovascular;  Laterality: Right;   LOWER EXTREMITY ANGIOGRAPHY Right 12/23/2020   Procedure:  Lower Extremity Angiography;  Surgeon: Algernon Huxley, MD;  Location: [place] [facility];  Service: Cardiovascular;  Laterality: Right;   LOWER EXTREMITY ANGIOGRAPHY Right 12/24/2020   Procedure: Lower Extremity Angiography;  Surgeon: Algernon Huxley, MD;  Location: [place] [facility];  Service: Cardiovascular;  Laterality: Right;   NECK SURGERY     NOSE SURGERY     ROTATOR CUFF REPAIR     x2   SHOULDER ARTHROSCOPY  12/07/2011   Procedure: ARTHROSCOPY SHOULDER;  Surgeon: Ninetta Lights, MD;  Location: [place];  Service: Orthopedics;  Laterality: Right;  Debridement Partial Cuff Tear, Release Coracoacromial Ligament   SHOULDER SURGERY  12/07/2011   right     Prior to Admission medications   Medication Sig Start Date End Date Taking? Authorizing Provider  albuterol (PROVENTIL) (2.5 MG/3ML) 0.083% nebulizer solution Take 3 mLs (2.5 mg total) by nebulization every 4 (four) hours as needed for wheezing or shortness of breath. 10/16/21 10/16/22 Yes Alona Danford, Vista Lawman, MD  predniSONE (DELTASONE) 50 MG tablet Take 1 tablet (50 mg total) by mouth daily with breakfast for 3 days. 10/16/21 10/19/21 Yes Krissia Schreier, Vista Lawman, MD  albuterol (VENTOLIN HFA) 108 (90 Base) MCG/ACT inhaler Inhale 2 puffs into the lungs every 6 (six) hours as needed for wheezing or shortness of breath. 10/16/21   Naaman Plummer, MD  ALPRAZolam Duanne Moron) 0.5 MG tablet Take 1 tablet (0.5 mg total) by mouth every 6 (six) hours as needed for anxiety. 01/20/21   Jennye Boroughs, MD  apixaban (ELIQUIS) 5 MG TABS tablet Take 1 tablet (5 mg total) by mouth 2 (two) times daily. 12/31/20 07/13/21  Sidney Ace, MD  aspirin EC 81 MG tablet Take 1 tablet (81 mg total) by mouth daily. 12/02/20   Algernon Huxley, MD  atorvastatin (LIPITOR) 10 MG tablet Take 1 tablet (10 mg total) by mouth daily. 12/18/19 07/13/21  Algernon Huxley, MD  buPROPion (WELLBUTRIN SR) 150 MG 12 hr tablet Take 150 mg by mouth daily.     [provider]  COMBIVENT RESPIMAT 20-100 MCG/ACT AERS respimat Inhale 1-2 puffs into the lungs 4 (four) times daily. 10/16/21   Naaman Plummer, MD  escitalopram (LEXAPRO) 10 MG tablet Take 10 mg by mouth daily in the afternoon. 10/15/13   [provider]  Fluticasone-Salmeterol (ADVAIR DISKUS) 250-50 MCG/DOSE AEPB Inhale 1 puff into the lungs 2 (two) times daily. 12/31/20 07/13/21  Sidney Ace, MD  levothyroxine (SYNTHROID, LEVOTHROID) 88 MCG tablet Take 88 mcg by mouth daily before breakfast.     [provider]  montelukast (SINGULAIR) 10 MG tablet Take 10 mg by mouth daily.    [provider]  oxyCODONE (OXY IR/ROXICODONE) 5 MG immediate release tablet Take 1  tablet (5 mg total) by mouth 2 (two) times daily as needed for severe pain. Must last 30 days 10/11/21 11/10/21  Milinda Pointer, MD  tizanidine (ZANAFLEX) 2 MG capsule Take 1 capsule (2 mg total) by mouth 3 (three) times daily as needed for muscle spasms. 02/17/21 07/13/21  Milinda Pointer, MD  traZODone (DESYREL) 100 MG tablet Take 1 tablet (100 mg total) by mouth at bedtime. Patient taking differently: Take 200 mg by mouth at bedtime. 08/21/18   Gladstone Lighter, MD    Allergies Patient has no known allergies.  Family History  Problem Relation Age of Onset   Aneurysm Other     Social History Social History   Tobacco Use   Smoking status:  Some Days    Packs/day: 0.00    Types: Cigarettes   Smokeless tobacco: Never   Tobacco comments:    pt states 2 cigs per month   Substance Use Topics   Alcohol use: No   Drug use: No    Review of Systems Constitutional: No fever/chills Eyes: No visual changes. ENT: No sore throat. Cardiovascular: Denies chest pain. Respiratory: Endorses shortness of breath. Gastrointestinal: No abdominal pain.  No nausea, no vomiting.  No diarrhea. Genitourinary: Negative for dysuria. Musculoskeletal: Negative for acute arthralgias Skin: Negative for rash. Neurological: Negative for headaches, weakness/numbness/paresthesias in any extremity Psychiatric: Negative for suicidal ideation/homicidal ideation   ____________________________________________   PHYSICAL EXAM:  VITAL SIGNS: ED Triage Vitals  Enc Vitals Group     BP 10/16/21 0552 102/82     Pulse Rate 10/16/21 0552 (!) 101     Resp 10/16/21 0552 (!) 22     Temp 10/16/21 0552 98.1 F (36.7 C)     Temp Source 10/16/21 0552 Oral     SpO2 10/16/21 0543 100 %     Weight 10/16/21 0552 110 lb (49.9 kg)     Height 10/16/21 0552 5\' 4"  (1.626 m)     Head Circumference --      Peak Flow --      Pain Score 10/16/21 0545 0     Pain Loc --      Pain Edu? --      Excl. in [place]? --     Constitutional: Alert and oriented. Well appearing and in no acute distress. Eyes: Conjunctivae are normal. PERRL. Head: Atraumatic. Nose: No congestion/rhinnorhea. Mouth/Throat: Mucous membranes are moist. Neck: No stridor Cardiovascular: Grossly normal heart sounds.  Good peripheral circulation. Respiratory: Expiratory wheezes over bilateral lung fields.  Normal respiratory effort.  No retractions. Gastrointestinal: Soft and nontender. No distention. Musculoskeletal: No obvious deformities Neurologic:  Normal speech and language. No gross focal neurologic deficits are appreciated. Skin:  Skin is warm and dry. No rash noted. Psychiatric: Mood and affect are normal. Speech and behavior are normal.  ____________________________________________   LABS (all labs ordered are listed, but only abnormal results are displayed)  Labs Reviewed  CBC - Abnormal; Notable for the following components:      Result Value   RDW 15.8 (*)    Platelets 461 (*)    All other components within normal limits  COMPREHENSIVE METABOLIC PANEL - Abnormal; Notable for the following components:   Glucose, Bld 122 (*)    Total Protein 6.4 (*)    ALT 50 (*)    Alkaline Phosphatase 134 (*)    All other components within normal limits  TROPONIN I (HIGH SENSITIVITY)  TROPONIN I (HIGH SENSITIVITY)   ____________________________________________  EKG  ED ECG REPORT I, Naaman Plummer, the attending physician, personally viewed and interpreted this ECG.  Date: 10/16/2021 EKG Time: 0558 Rate: 127 Rhythm: Tachycardic sinus rhythm QRS Axis: normal Intervals: normal ST/T Wave abnormalities: normal Narrative Interpretation: Tachycardic sinus rhythm.  No evidence of acute ischemia  ____________________________________________  RADIOLOGY  ED MD interpretation: One-view portable chest x-ray shows no evidence of acute abnormalities including no pneumonia, pneumothorax, or widened mediastinum  Official  radiology report(s): DG Chest 2 View  Result Date: 10/16/2021 CLINICAL DATA:  75 year old female with history of chest pain and shortness of breath. EXAM: CHEST - 2 VIEW COMPARISON:  Chest x-ray 03/21/2021. FINDINGS: Lung volumes are increased with advanced emphysematous changes. No consolidative airspace disease. No pleural effusions. No pneumothorax. No  pulmonary nodule or mass noted. Pulmonary vasculature and the cardiomediastinal silhouette are within normal limits. Atherosclerosis in the thoracic aorta. Orthopedic fixation hardware in the lower cervical spine incidentally noted. IMPRESSION: 1.  No radiographic evidence of acute cardiopulmonary disease. 2. Emphysema. 3. Aortic atherosclerosis. Electronically Signed   By: Vinnie Langton M.D.   On: 10/16/2021 06:27    ____________________________________________   PROCEDURES  Procedure(s) performed (including Critical Care):  Procedures   ____________________________________________   INITIAL IMPRESSION / ASSESSMENT AND PLAN / ED COURSE  As part of my medical decision making, I reviewed the following data within the electronic medical record, if available:  Nursing notes reviewed and incorporated, Labs reviewed, EKG interpreted, Old chart reviewed, Radiograph reviewed and Notes from prior ED visits reviewed and incorporated        The patient appears to be suffering from a moderate exacerbation of COPD.  Based on the history, exam, CXR/EKG, and further workup I dont suspect any other emergent cause of this presentation, such as pneumonia, acute coronary syndrome, congestive heart failure, pulmonary embolism, or pneumothorax.  ED Interventions: bronchodilators, steroids, antibiotics, reassess  0857 Reassessment: After treatment, the patients shortness of breath is resolved, and their lung exam has returned to baseline. They are comfortable and want to go home.  Rx: Steroids, Antibiotics, Albuterol Disposition: Discharge home  with SRP. PCP follow up recommended in next 48hours.      ____________________________________________   FINAL CLINICAL IMPRESSION(S) / ED DIAGNOSES  Final diagnoses:  COPD exacerbation [facility])     ED Discharge Orders          Ordered    albuterol (VENTOLIN HFA) 108 (90 Base) MCG/ACT inhaler  Every 6 hours PRN        10/16/21 0817    COMBIVENT RESPIMAT 20-100 MCG/ACT AERS respimat  4 times daily        10/16/21 0817    predniSONE (DELTASONE) 50 MG tablet  Daily with breakfast        10/16/21 0817    albuterol (PROVENTIL) (2.5 MG/3ML) 0.083% nebulizer solution  Every 4 hours PRN        10/16/21 0817             Note:  This document was prepared using Dragon voice recognition software and may include unintentional dictation errors.    Naaman Plummer, MD 10/16/21 [phone]

## 2021-10-16 NOTE — ED Notes (Signed)
Pt friend Clarisa Kindred will come pick up pt and take her home.

## 2021-10-16 NOTE — ED Notes (Signed)
Attempted to call [name], pt's emergency contact to come and pick up pt. This RN left a voicemail. Also attempted to call Clarisa Kindred, pt's friend with not answer.

## 2021-10-18 ENCOUNTER — Emergency Department: Payer: Medicare HMO

## 2021-10-18 ENCOUNTER — Other Ambulatory Visit: Payer: Self-pay

## 2021-10-18 ENCOUNTER — Telehealth: Payer: Self-pay

## 2021-10-18 ENCOUNTER — Inpatient Hospital Stay
Admission: EM | Admit: 2021-10-18 | Discharge: 2021-10-23 | DRG: 270 | Disposition: A | Discharge status: Home Health | Payer: Medicare HMO | Attending: Internal Medicine | Admitting: Internal Medicine

## 2021-10-18 DIAGNOSIS — L03031 Cellulitis of right toe: Secondary | ICD-10-CM | POA: Diagnosis present

## 2021-10-18 DIAGNOSIS — L089 Local infection of the skin and subcutaneous tissue, unspecified: Secondary | ICD-10-CM

## 2021-10-18 DIAGNOSIS — Z23 Encounter for immunization: Secondary | ICD-10-CM

## 2021-10-18 DIAGNOSIS — L97519 Non-pressure chronic ulcer of other part of right foot with unspecified severity: Secondary | ICD-10-CM | POA: Diagnosis present

## 2021-10-18 DIAGNOSIS — F32A Depression, unspecified: Secondary | ICD-10-CM | POA: Diagnosis present

## 2021-10-18 DIAGNOSIS — I2510 Atherosclerotic heart disease of native coronary artery without angina pectoris: Secondary | ICD-10-CM | POA: Diagnosis present

## 2021-10-18 DIAGNOSIS — L03115 Cellulitis of right lower limb: Secondary | ICD-10-CM | POA: Diagnosis present

## 2021-10-18 DIAGNOSIS — Z8711 Personal history of peptic ulcer disease: Secondary | ICD-10-CM

## 2021-10-18 DIAGNOSIS — Z79899 Other long term (current) drug therapy: Secondary | ICD-10-CM

## 2021-10-18 DIAGNOSIS — I70261 Atherosclerosis of native arteries of extremities with gangrene, right leg: Principal | ICD-10-CM | POA: Diagnosis present

## 2021-10-18 DIAGNOSIS — I10 Essential (primary) hypertension: Secondary | ICD-10-CM | POA: Diagnosis present

## 2021-10-18 DIAGNOSIS — E876 Hypokalemia: Secondary | ICD-10-CM | POA: Diagnosis present

## 2021-10-18 DIAGNOSIS — F419 Anxiety disorder, unspecified: Secondary | ICD-10-CM | POA: Diagnosis not present

## 2021-10-18 DIAGNOSIS — F17210 Nicotine dependence, cigarettes, uncomplicated: Secondary | ICD-10-CM | POA: Diagnosis present

## 2021-10-18 DIAGNOSIS — E43 Unspecified severe protein-calorie malnutrition: Secondary | ICD-10-CM | POA: Diagnosis present

## 2021-10-18 DIAGNOSIS — Z821 Family history of blindness and visual loss: Secondary | ICD-10-CM

## 2021-10-18 DIAGNOSIS — H9190 Unspecified hearing loss, unspecified ear: Secondary | ICD-10-CM | POA: Diagnosis present

## 2021-10-18 DIAGNOSIS — E039 Hypothyroidism, unspecified: Secondary | ICD-10-CM | POA: Diagnosis present

## 2021-10-18 DIAGNOSIS — Z79890 Hormone replacement therapy (postmenopausal): Secondary | ICD-10-CM

## 2021-10-18 DIAGNOSIS — Z681 Body mass index (BMI) 19 or less, adult: Secondary | ICD-10-CM

## 2021-10-18 DIAGNOSIS — R0989 Other specified symptoms and signs involving the circulatory and respiratory systems: Secondary | ICD-10-CM | POA: Diagnosis present

## 2021-10-18 DIAGNOSIS — Z981 Arthrodesis status: Secondary | ICD-10-CM

## 2021-10-18 DIAGNOSIS — M869 Osteomyelitis, unspecified: Secondary | ICD-10-CM | POA: Diagnosis present

## 2021-10-18 DIAGNOSIS — E785 Hyperlipidemia, unspecified: Secondary | ICD-10-CM | POA: Diagnosis present

## 2021-10-18 DIAGNOSIS — Z955 Presence of coronary angioplasty implant and graft: Secondary | ICD-10-CM

## 2021-10-18 DIAGNOSIS — L0390 Cellulitis, unspecified: Secondary | ICD-10-CM | POA: Diagnosis present

## 2021-10-18 DIAGNOSIS — J441 Chronic obstructive pulmonary disease with (acute) exacerbation: Secondary | ICD-10-CM | POA: Diagnosis present

## 2021-10-18 DIAGNOSIS — T82856A Stenosis of peripheral vascular stent, initial encounter: Secondary | ICD-10-CM | POA: Diagnosis present

## 2021-10-18 DIAGNOSIS — M48061 Spinal stenosis, lumbar region without neurogenic claudication: Secondary | ICD-10-CM | POA: Diagnosis present

## 2021-10-18 DIAGNOSIS — Z818 Family history of other mental and behavioral disorders: Secondary | ICD-10-CM

## 2021-10-18 DIAGNOSIS — Z89421 Acquired absence of other right toe(s): Secondary | ICD-10-CM

## 2021-10-18 DIAGNOSIS — Z20822 Contact with and (suspected) exposure to covid-19: Secondary | ICD-10-CM | POA: Diagnosis present

## 2021-10-18 DIAGNOSIS — K219 Gastro-esophageal reflux disease without esophagitis: Secondary | ICD-10-CM | POA: Diagnosis present

## 2021-10-18 DIAGNOSIS — Z7982 Long term (current) use of aspirin: Secondary | ICD-10-CM

## 2021-10-18 DIAGNOSIS — Z825 Family history of asthma and other chronic lower respiratory diseases: Secondary | ICD-10-CM

## 2021-10-18 DIAGNOSIS — Z7901 Long term (current) use of anticoagulants: Secondary | ICD-10-CM

## 2021-10-18 DIAGNOSIS — Z7902 Long term (current) use of antithrombotics/antiplatelets: Secondary | ICD-10-CM

## 2021-10-18 DIAGNOSIS — G894 Chronic pain syndrome: Secondary | ICD-10-CM | POA: Diagnosis present

## 2021-10-18 DIAGNOSIS — Z7951 Long term (current) use of inhaled steroids: Secondary | ICD-10-CM

## 2021-10-18 DIAGNOSIS — E440 Moderate protein-calorie malnutrition: Secondary | ICD-10-CM | POA: Insufficient documentation

## 2021-10-18 DIAGNOSIS — Z8249 Family history of ischemic heart disease and other diseases of the circulatory system: Secondary | ICD-10-CM

## 2021-10-18 LAB — CBC WITH DIFFERENTIAL/PLATELET
Abs Immature Granulocytes: 0.08 10*3/uL — ABNORMAL HIGH (ref 0.00–0.07)
Basophils Absolute: 0.1 10*3/uL (ref 0.0–0.1)
Basophils Relative: 1 %
Eosinophils Absolute: 0.1 10*3/uL (ref 0.0–0.5)
Eosinophils Relative: 1 %
HCT: 38 % (ref 36.0–46.0)
Hemoglobin: 12.3 g/dL (ref 12.0–15.0)
Immature Granulocytes: 1 %
Lymphocytes Relative: 18 %
Lymphs Abs: 2.4 10*3/uL (ref 0.7–4.0)
MCH: 28.7 pg (ref 26.0–34.0)
MCHC: 32.4 g/dL (ref 30.0–36.0)
MCV: 88.6 fL (ref 80.0–100.0)
Monocytes Absolute: 1.1 10*3/uL — ABNORMAL HIGH (ref 0.1–1.0)
Monocytes Relative: 8 %
Neutro Abs: 9.8 10*3/uL — ABNORMAL HIGH (ref 1.7–7.7)
Neutrophils Relative %: 71 %
Platelets: 477 10*3/uL — ABNORMAL HIGH (ref 150–400)
RBC: 4.29 MIL/uL (ref 3.87–5.11)
RDW: 15.6 % — ABNORMAL HIGH (ref 11.5–15.5)
WBC: 13.6 10*3/uL — ABNORMAL HIGH (ref 4.0–10.5)
nRBC: 0 % (ref 0.0–0.2)

## 2021-10-18 LAB — COMPREHENSIVE METABOLIC PANEL
ALT: 36 U/L (ref 0–44)
AST: 27 U/L (ref 15–41)
Albumin: 3.8 g/dL (ref 3.5–5.0)
Alkaline Phosphatase: 109 U/L (ref 38–126)
Anion gap: 10 (ref 5–15)
BUN: 12 mg/dL (ref 8–23)
CO2: 26 mmol/L (ref 22–32)
Calcium: 9.1 mg/dL (ref 8.9–10.3)
Chloride: 99 mmol/L (ref 98–111)
Creatinine, Ser: 0.61 mg/dL (ref 0.44–1.00)
GFR, Estimated: >60 mL/min (ref >60)
Glucose, Bld: 103 mg/dL — ABNORMAL HIGH (ref 70–99)
Potassium: 3.7 mmol/L (ref 3.5–5.1)
Sodium: 135 mmol/L (ref 135–145)
Total Bilirubin: 0.7 mg/dL (ref 0.3–1.2)
Total Protein: 7.2 g/dL (ref 6.5–8.1)

## 2021-10-18 LAB — RESP PANEL BY RT-PCR (FLU A&B, COVID) ARPGX2
Influenza A by PCR: NEGATIVE
Influenza B by PCR: NEGATIVE
SARS Coronavirus 2 by RT PCR: NEGATIVE

## 2021-10-18 LAB — LACTIC ACID, PLASMA: Lactic Acid, Venous: 1.5 mmol/L (ref 0.5–1.9)

## 2021-10-18 MED ORDER — PIPERACILLIN-TAZOBACTAM 3.375 G IVPB 30 MIN
3.3750 g | Freq: Once | INTRAVENOUS | Status: AC
  Administered 2021-10-19: 3.375 g via INTRAVENOUS
  Filled 2021-10-18: qty 50

## 2021-10-18 MED ORDER — SODIUM CHLORIDE 0.9 % IV BOLUS
1000.0000 mL | Freq: Once | INTRAVENOUS | Status: AC
  Administered 2021-10-19: 1000 mL via INTRAVENOUS

## 2021-10-18 MED ORDER — OXYCODONE-ACETAMINOPHEN 5-325 MG PO TABS
ORAL_TABLET | ORAL | Status: AC
  Administered 2021-10-18: 1 via ORAL
  Filled 2021-10-18: qty 1

## 2021-10-18 MED ORDER — VANCOMYCIN HCL IN DEXTROSE 1-5 GM/200ML-% IV SOLN: 1000.0000 mg | Freq: Once | INTRAVENOUS | Status: AC

## 2021-10-18 MED ORDER — OXYCODONE-ACETAMINOPHEN 5-325 MG PO TABS: 1.0000 | ORAL_TABLET | Freq: Once | ORAL | Status: AC

## 2021-10-18 MED ORDER — VANCOMYCIN HCL IN DEXTROSE 1-5 GM/200ML-% IV SOLN
INTRAVENOUS | Status: AC
  Administered 2021-10-18: 1000 mg via INTRAVENOUS
  Filled 2021-10-18: qty 200

## 2021-10-18 NOTE — Telephone Encounter (Signed)
She states she was talking to someone and got cut off. She wants to speak to a nurse as soon as possible.

## 2021-10-18 NOTE — Telephone Encounter (Signed)
Patient has called in and states that she has pulled something across her shoulder blade.  She states that she had to call EMS the other night to get her out of the floor.  States she is having hallucinations and that she feels that she is dehydrated.  I asked if she was drinking anything, she states pedialyte and ensure but probably not enough.  States she is going to see Dr Vickki Muff today for an infection in her foot.  States that she feels she has infection all over her body.    Also c/o the Rx bottles being difficult to open and that she had to pry the lid off with a fork the other night and the lid broke and pills went everywhere.  States she does have all the pieces but wanted [place] to be aware.    I told patient that since she has the appt with Dr Vickki Muff @ 1400 today, to let him assess her and then if we need to do anything for her to give [place] a call back. Patient is agreeable to this.

## 2021-10-18 NOTE — H&P (Signed)
History and Physical    [name] FTD:[id number] DOB: 1946-06-21 DOA: 10/18/2021  PCP: Albina Billet, MD    Patient coming from:  Home x-rays gangrene   Chief Complaint:  Right foot cellulitis   HPI:  [name] is a 75 y.o. female seen in ed with complaints of presenting with infectious the fourth MTP.  Patient has seen Dr. Vickki Muff at [facility] and saw him today who referred her here.  Patient presents with multiple chronic complaints.  Foot pain from several years back pain for several years states that she was seen by her podiatrist and was asked to come to the hospital states that she likely needs to have an amputation. 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 Blood work shows normal BMP, normal troponin of 9, lactic acid of 1.5, CBC shows white count of 13.6 with 7.  X-ray of the right foot shows soft tissue gas in the fourth digit, in the emergency room patient given vancomycin and Zosyn after blood cultures were collected.  BMP shows glucose of 101, CBC shows white count 13.6 15.6 hemoglobin   Review of Systems:  Review of Systems  Constitutional:  Positive for malaise/fatigue.  Respiratory:  Positive for sputum production.   Musculoskeletal:  Positive for joint pain. Negative for back pain.  Neurological:  Negative for weakness.    Past Medical History:  Diagnosis Date   Anxiety    CAD (coronary artery disease)    Carpal tunnel syndrome     Cataract    Complication of anesthesia    has woken up at times   COPD (chronic obstructive pulmonary disease) (HCC)    CRP elevated 09/14/2015   Depression    Difficult intubation    Dyslipidemia    Dyspnea    DOE   Dysrhythmia    hx palpatations   Elevated sedimentation rate 09/14/2015   Esophageal spasm    Gastrointestinal parasites    GERD (gastroesophageal reflux disease)    Hiatal hernia    History of peptic ulcer disease    Hypertension    Hyperthyroidism    Hypothyroidism    Low magnesium levels 09/14/2015   Pelvic fracture ([place]) 2008   fall from riding a horse   Peripheral vascular disease (HCC)    Reflux    Rotator cuff injury    Stenosis, spinal, lumbar     Past Surgical History:  Procedure Laterality Date   AMPUTATION TOE Right 12/26/2020   Procedure: AMPUTATION TOE-3rd Toes;  Surgeon: Samara Deist, DPM;  Location: [facility];  Service: Podiatry;  Laterality: Right;   AMPUTATION TOE Right 04/22/2021   Procedure: AMPUTATION TOE- RIGHT 2ND;  Surgeon: Samara Deist, DPM;  Location: [facility];  Service: Podiatry;  Laterality: Right;   APPENDECTOMY     BACK SURGERY  CERVICAL FUSION   CARPAL TUNNEL RELEASE     CATARACT EXTRACTION W/PHACO Right 08/07/2017   Procedure: CATARACT EXTRACTION PHACO AND INTRAOCULAR LENS PLACEMENT ([place]);  Surgeon: Birder Robson, MD;  Location: [facility];  Service: Ophthalmology;  Laterality: Right;  [place] 00:52.0 AP% 16.8 CDE 8.74 Fluid Pack Lot # [id number]   CATARACT EXTRACTION W/PHACO Left 09/04/2017   Procedure: CATARACT EXTRACTION PHACO AND INTRAOCULAR LENS PLACEMENT ([place]);  Surgeon: Birder Robson, MD;  Location: [facility];  Service: Ophthalmology;  Laterality: Left;  [place] 00:34 AP% 17.0 CDE 5.80 Fluid pack lot # [id number]   CESAREAN SECTION     CHOLECYSTECTOMY     FRACTURE SURGERY     HIP SURGERY     INTRAMEDULLARY (IM) NAIL INTERTROCHANTERIC Right 10/01/2016   Procedure: INTRAMEDULLARY (IM) NAIL INTERTROCHANTRIC;  Surgeon:  Corky Mull, MD;  Location: [facility];  Service: Orthopedics;  Laterality: Right;   INTRAMEDULLARY (IM) NAIL INTERTROCHANTERIC Left 01/14/2021   Procedure: INTRAMEDULLARY (IM) NAIL INTERTROCHANTRIC;  Surgeon: Lovell Sheehan, MD;  Location: [facility];  Service: Orthopedics;  Laterality: Left;   KYPHOPLASTY N/A 08/20/2018   Procedure: [id number];  Surgeon: Hessie Knows, MD;  Location: [facility];  Service: Orthopedics;  Laterality: N/A;   LOWER EXTREMITY ANGIOGRAPHY Right 12/18/2019   Procedure: LOWER EXTREMITY ANGIOGRAPHY;  Surgeon: Algernon Huxley, MD;  Location: [place] [facility];  Service: Cardiovascular;  Laterality: Right;   LOWER EXTREMITY ANGIOGRAPHY Right 12/02/2020   Procedure: LOWER EXTREMITY ANGIOGRAPHY;  Surgeon: Algernon Huxley, MD;  Location: [place] [facility];  Service: Cardiovascular;  Laterality: Right;   LOWER EXTREMITY ANGIOGRAPHY Right 12/23/2020   Procedure: Lower Extremity Angiography;  Surgeon: Algernon Huxley, MD;  Location: [place] [facility];  Service: Cardiovascular;  Laterality: Right;   LOWER EXTREMITY ANGIOGRAPHY Right 12/24/2020   Procedure: Lower Extremity Angiography;  Surgeon: Algernon Huxley, MD;  Location: [place] [facility];  Service: Cardiovascular;  Laterality: Right;   NECK SURGERY     NOSE SURGERY     ROTATOR CUFF REPAIR     x2   SHOULDER ARTHROSCOPY  12/07/2011   Procedure: ARTHROSCOPY SHOULDER;  Surgeon: Ninetta Lights, MD;  Location: [place];  Service: Orthopedics;  Laterality: Right;  Debridement Partial Cuff Tear, Release Coracoacromial Ligament   SHOULDER SURGERY  12/07/2011   right     reports that she has been smoking cigarettes. She has never used smokeless tobacco. She reports that she does not drink alcohol and does not use drugs.  No Known Allergies  Family History  Problem Relation Age of Onset   Aneurysm Other     Prior to Admission medications   Medication Sig Start Date End Date Taking? Authorizing Provider  albuterol  (PROVENTIL) (2.5 MG/3ML) 0.083% nebulizer solution Take 3 mLs (2.5 mg total) by nebulization every 4 (four) hours as needed for wheezing or shortness of breath. 10/16/21 10/16/22  Naaman Plummer, MD  albuterol (VENTOLIN HFA) 108 (90 Base) MCG/ACT inhaler Inhale 2 puffs into the lungs every 6 (six) hours as needed for wheezing or shortness of breath. 10/16/21   Naaman Plummer, MD  ALPRAZolam Duanne Moron) 0.5 MG tablet Take 1 tablet (0.5 mg total) by mouth every 6 (six) hours as needed for anxiety. 01/20/21   Jennye Boroughs, MD  apixaban (ELIQUIS) 5 MG TABS tablet Take 1 tablet (5 mg total) by mouth 2 (two) times daily. 12/31/20 07/13/21  Sidney Ace, MD  aspirin EC 81 MG tablet Take 1 tablet (  81 mg total) by mouth daily. 12/02/20   Algernon Huxley, MD  atorvastatin (LIPITOR) 10 MG tablet Take 1 tablet (10 mg total) by mouth daily. 12/18/19 07/13/21  Algernon Huxley, MD  buPROPion (WELLBUTRIN SR) 150 MG 12 hr tablet Take 150 mg by mouth daily.     [provider]  COMBIVENT RESPIMAT 20-100 MCG/ACT AERS respimat Inhale 1-2 puffs into the lungs 4 (four) times daily. 10/16/21   Naaman Plummer, MD  escitalopram (LEXAPRO) 10 MG tablet Take 10 mg by mouth daily in the afternoon. 10/15/13   [provider]  Fluticasone-Salmeterol (ADVAIR DISKUS) 250-50 MCG/DOSE AEPB Inhale 1 puff into the lungs 2 (two) times daily. 12/31/20 07/13/21  Sidney Ace, MD  levothyroxine (SYNTHROID, LEVOTHROID) 88 MCG tablet Take 88 mcg by mouth daily before breakfast.     [provider]  montelukast (SINGULAIR) 10 MG tablet Take 10 mg by mouth daily.    [provider]  oxyCODONE (OXY IR/ROXICODONE) 5 MG immediate release tablet Take 1 tablet (5 mg total) by mouth 2 (two) times daily as needed for severe pain. Must last 30 days 10/11/21 11/10/21  Milinda Pointer, MD  predniSONE (DELTASONE) 50 MG tablet Take 1 tablet (50 mg total) by mouth daily with breakfast for 3 days. 10/16/21 10/19/21   Naaman Plummer, MD  tizanidine (ZANAFLEX) 2 MG capsule Take 1 capsule (2 mg total) by mouth 3 (three) times daily as needed for muscle spasms. 02/17/21 07/13/21  Milinda Pointer, MD  traZODone (DESYREL) 100 MG tablet Take 1 tablet (100 mg total) by mouth at bedtime. Patient taking differently: Take 200 mg by mouth at bedtime. 08/21/18   Gladstone Lighter, MD    Physical Exam: Vitals:   10/18/21 1504 10/18/21 1505 10/18/21 2223 10/19/21 0027  BP: 116/76  113/74 132/81  Pulse: (!) 131  (!) 125 (!) 106  Resp: 20  20 20   Temp: 98.5 F (36.9 C)     TempSrc: Oral     SpO2: 93%  95% 96%  Weight:  50 kg    Height:  5\' 4"  (1.626 m)     Physical Exam Vitals reviewed.  Constitutional:      General: She is not in acute distress.    Appearance: She is not ill-appearing, toxic-appearing or diaphoretic.  HENT:     Head: Normocephalic and atraumatic.     Right Ear: External ear normal.     Left Ear: External ear normal.     Nose: Nose normal.     Mouth/Throat:     Mouth: Mucous membranes are moist.     Pharynx: No posterior oropharyngeal erythema.  Eyes:     Extraocular Movements: Extraocular movements intact.     Pupils: Pupils are equal, round, and reactive to light.  Cardiovascular:     Rate and Rhythm: Normal rate and regular rhythm.     Pulses:          Dorsalis pedis pulses are 0 on the right side and 0 on the left side.       Posterior tibial pulses are 0 on the right side and 0 on the left side.     Heart sounds: Normal heart sounds.  Pulmonary:     Effort: Pulmonary effort is normal.     Breath sounds: Normal breath sounds.  Abdominal:     General: Bowel sounds are normal. There is no distension.     Palpations: Abdomen is soft. There is no mass.  Tenderness: There is no abdominal tenderness. There is no guarding.     Hernia: No hernia is present.  Musculoskeletal:        General: Normal range of motion.     Right lower leg: No edema.     Left lower leg: No edema.   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     Mental Status: She is alert and oriented to person, place, and time.  Psychiatric:        Mood and Affect: Mood normal.        Behavior: Behavior normal.   Labs on Admission: I have personally reviewed following labs and imaging studies  No results for input(s): CKTOTAL, CKMB, TROPONINI in the last 72 hours. Lab Results  Component Value Date   WBC 13.6 (H) 10/18/2021   HGB 12.3 10/18/2021   HCT 38.0 10/18/2021   MCV 88.6 10/18/2021   PLT 477 (H) 10/18/2021    Recent Labs  Lab 10/18/21 1508  NA 135  K 3.7  CL 99  CO2 26  BUN 12  CREATININE 0.61  CALCIUM 9.1  PROT 7.2  BILITOT 0.7  ALKPHOS 109  ALT 36  AST 27  GLUCOSE 103*   Lab Results  Component Value Date   CHOL 235 (H) 11/25/2010   HDL 54 11/25/2010   LDLCALC 151 (H) 11/25/2010   TRIG 149 11/25/2010   No results found for: DDIMER Invalid input(s): POCBNP   COVID-19 Labs No results for input(s): DDIMER, FERRITIN, LDH, CRP in the last 72 hours. Lab Results  Component Value Date   SARSCOV2NAA NEGATIVE 10/18/2021   [place] NEGATIVE 03/21/2021   [place] NEGATIVE 01/19/2021   [place] NEGATIVE 01/11/2021    Radiological Exams on Admission: DG Chest 2 View  Result Date: 10/18/2021 CLINICAL DATA:  Infection EXAM: CHEST - 2 VIEW COMPARISON:  Chest x-ray dated October 16, 2021 FINDINGS: Cardiac and mediastinal contours are unchanged and within normal limits. Trace bilateral pleural effusions. Background emphysema. Mild reticular opacities at the right greater than left lung bases,  possibly due to scarring. No focal consolidation. No evidence of pneumothorax. IMPRESSION: Trace bilateral pleural effusions.  No focal consolidation. Electronically Signed   By: Yetta Glassman M.D.   On: 10/18/2021 15:50   DG Foot Complete Right  Result Date: 10/18/2021 CLINICAL DATA:  Right foot gangrene. EXAM: RIGHT FOOT COMPLETE - 3+ VIEW COMPARISON:  December 22, 2020. FINDINGS: Status post surgical amputation of the second and third toes. No fracture or dislocation is noted. Abnormal soft tissue gas is seen around the fourth distal phalanx concerning for cellulitis. No definite lytic destruction is seen to suggest osteomyelitis. IMPRESSION: Postsurgical changes as described above. Abnormal soft tissue gas seen surrounding the fourth distal phalanx concerning for cellulitis. No definite lytic destruction is seen to suggest osteomyelitis. Electronically Signed   By: Marijo Conception M.D.   On: 10/18/2021 15:51    EKG: Independently reviewed.  None   Assessment/Plan: Principal Problem:   Cellulitis Active Problems:   Absent pedal pulses   Hypothyroidism   Anxiety and depression  Hypokalemia   Hypomagnesemia   Cellulitis: Patient continued on IV antibiotic therapy. Podiatry consult for a.m. team.  Absent pedal pulses: Vascular consult per a.m. team. As needed Dilaudid for pain. N.p.o. after midnight.  Hypothyroidism: Continually with thyroxine 88 mcg.  Anxiety/depression: Continue Lexapro, Xanax, Wellbutrin, trazodone.  Hypokalemia: We will follow levels and replace as needed.   Hypomagnesemia: We will follow levels and replace as needed.  Patient's home medication continued she is currently taking prednisone 50 mg daily for suspected underlying COPD, patient is also on Eliquis which we have continued.    DVT prophylaxis:  Eliquis  Code Status:  Full code  Family Communication:  None  Disposition Plan:  Home  Consults called:  None Podiatry and vascular  consult to be requested per a.m. team.  Admission status: Inpatient   Para Skeans MD [facility] [phone] How to contact the [facility] Attending or Consulting provider [place] or covering provider during after hours [place], for this patient.    Check the care team in [facility] and look for a) attending/consulting [facility] provider listed and b) the [facility] team listed Log into [email or web address] and use [place]'s universal password to access. If you do not have the password, please contact the hospital operator. Locate the [facility] provider you are looking for under [facility] and page to a number that you can be directly reached. If you still have difficulty reaching the provider, please page the [facility] (Director on Call) for the Hospitalists listed on amion for assistance. [email or web address] Password [email or web address] 10/19/2021, 12:58 AM

## 2021-10-18 NOTE — ED Provider Notes (Signed)
[facility]  ____________________________________________   Event Date/Time   First MD Initiated Contact with Patient 10/18/21 2104     (approximate)  I have reviewed the triage vital signs and the nursing notes.   HISTORY  Chief Complaint Wound Infection    HPI Sabrie Moritz is a 75 y.o. female with past medical history of coronary disease, COPD, peripheral vascular disease PAD s/p mechanical thrombectomy and stent angioplasty of the right lower extremity with gangrene of the right toes in the past who presents with concern for infection of the fourth toe.  Symptoms been going on for the past 2 weeks.  Patient notes significant pain in that area and difficulty ambulating.  She denies frank fevers or chills.  Patient follows with Dr. Vickki Muff at [facility] clinic and saw him today he referred her to the emergency department as will likely need to be amputated.  Patient denies any exertional or rest pain in her lower extremities.         Past Medical History:  Diagnosis Date   Anxiety    CAD (coronary artery disease)    Carpal tunnel syndrome    Cataract    Complication of anesthesia    has woken up at times   COPD (chronic obstructive pulmonary disease) (HCC)    CRP elevated 09/14/2015   Depression    Difficult intubation    Dyslipidemia    Dyspnea    DOE   Dysrhythmia    hx palpatations   Elevated sedimentation rate 09/14/2015   Esophageal spasm    Gastrointestinal parasites    GERD (gastroesophageal reflux disease)    Hiatal hernia    History of peptic ulcer disease    Hypertension    Hyperthyroidism    Hypothyroidism    Low magnesium levels 09/14/2015   Pelvic fracture ([place]) 2008   fall from riding a horse   Peripheral vascular disease (HCC)    Reflux    Rotator cuff injury    Stenosis, spinal, lumbar     Patient Active Problem List   Diagnosis Date Noted   Chronic hip pain (Left) 02/17/2021   Hip pain, acute (Left) 02/17/2021    Chronic use of opiate for therapeutic purpose 02/16/2021   Closed fracture of hip, sequela (Left) 02/07/2021   Fracture of femoral neck, left, closed ([place]) 01/11/2021   Chronic respiratory failure with hypoxia ([place]) 01/11/2021   Hypokalemia 01/11/2021   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 ([place]) 12/22/2020   PAD (peripheral artery disease) ([place]) 12/22/2020   Encounter for long-term opiate analgesic use 11/11/2020   Dry gangrene ([place]) of middle toe, right foot 11/11/2020   Foot pain, right 11/11/2020   Hard of hearing 11/11/2020   Atherosclerosis of native arteries of the extremities with gangrene ([place]) 12/09/2019   Vitamin D deficiency 11/12/2019   Pharmacologic therapy 06/04/2019   Disorder of skeletal system 06/04/2019   Problems influencing health status 06/04/2019   Sepsis ([place]) 10/22/2018   HCAP (healthcare-associated pneumonia) 10/22/2018   Compression fracture of L3 vertebra ([place]) 08/19/2018   Acute UTI 08/18/2018   Chronic hip pain (Bilateral) 12/31/2017   Neurogenic pain 08/29/2017   Chronic low back pain (1ry area of Pain) (Right) w/o sciatica 08/29/2017   Chronic sacroiliac joint pain (Right) 06/05/2017   Vitamin D insufficiency 03/12/2017   Right hip pain 02/20/2017   Intertrochanteric fracture of right hip, sequela 02/20/2017   B12 deficiency 02/05/2017   Pressure injury of skin 10/02/2016   Closed  displaced intertrochanteric fracture of right femur ([place]) 10/02/2016   Hip fracture ([place]) 10/01/2016   Cervical facet hypertrophy (Bilateral) 05/27/2016   History of shoulder surgery 5 (Right) 05/27/2016   Lumbar foraminal stenosis (L3-4) (Left) 05/27/2016   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) 05/27/2016   Lumbar facet hypertrophy (Bilateral) 05/27/2016   Lumbar facet syndrome (Right) 05/27/2016   Lumbar grade 1 Anterolisthesis of L3 over L4 05/27/2016   Chronic shoulder pain (Bilateral) (status post multiple surgeries) (R>L) 12/08/2015   Substance use  disorder Risk: High 09/14/2015   Chronic pain syndrome 09/14/2015   Cervical spondylosis 09/14/2015   Chronic neck pain (2ry area of Pain) (Bilateral) (R>L) 09/14/2015   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) 09/14/2015   Cervical facet syndrome (Bilateral) (R>L) 09/14/2015   Cervical myofascial pain syndrome 09/14/2015   Lumbar spondylosis 09/14/2015   Chronic shoulder impingement syndrome (Right) 09/14/2015   Hypomagnesemia 09/14/2015   CRP elevated 09/14/2015   Elevated sedimentation rate 09/14/2015   Chronic obstructive pulmonary disease (COPD) ([place]) 09/14/2015   Nicotine dependence 09/14/2015   Chronic shoulder pain (Right) 09/14/2015   Abnormal nerve conduction studies 09/14/2015   Encounter for therapeutic drug level monitoring 09/09/2015   Long term current use of opiate analgesic 09/09/2015   Long term prescription opiate use 16/08/9603   Uncomplicated opioid dependence ([place]) 09/09/2015   Opiate use 09/09/2015   Anxiety and depression 07/11/2012   Tobacco use disorder 05/27/2011   Fatigue 05/27/2011   Hypothyroidism 11/25/2010   HLD (hyperlipidemia) 08/24/2010   Tachycardia 08/24/2010   DYSPNEA 08/24/2010    Past Surgical History:  Procedure Laterality Date   AMPUTATION TOE Right 12/26/2020   Procedure: AMPUTATION TOE-3rd Toes;  Surgeon: Samara Deist, DPM;  Location: [facility];  Service: Podiatry;  Laterality: Right;   AMPUTATION TOE Right 04/22/2021   Procedure: AMPUTATION TOE- RIGHT 2ND;  Surgeon: Samara Deist, DPM;  Location: [facility];  Service: Podiatry;  Laterality: Right;   APPENDECTOMY     BACK SURGERY     CERVICAL FUSION   CARPAL TUNNEL RELEASE     CATARACT EXTRACTION W/PHACO Right 08/07/2017   Procedure: CATARACT EXTRACTION PHACO AND INTRAOCULAR LENS PLACEMENT ([place]);  Surgeon: Birder Robson, MD;  Location: [facility];  Service: Ophthalmology;  Laterality: Right;  [place] 00:52.0 AP% 16.8 CDE 8.74 Fluid Pack Lot # [id number]    CATARACT EXTRACTION W/PHACO Left 09/04/2017   Procedure: CATARACT EXTRACTION PHACO AND INTRAOCULAR LENS PLACEMENT ([place]);  Surgeon: Birder Robson, MD;  Location: [facility];  Service: Ophthalmology;  Laterality: Left;  [place] 00:34 AP% 17.0 CDE 5.80 Fluid pack lot # [id number]   CESAREAN SECTION     CHOLECYSTECTOMY     FRACTURE SURGERY     HIP SURGERY     INTRAMEDULLARY (IM) NAIL INTERTROCHANTERIC Right 10/01/2016   Procedure: INTRAMEDULLARY (IM) NAIL INTERTROCHANTRIC;  Surgeon: Corky Mull, MD;  Location: [facility];  Service: Orthopedics;  Laterality: Right;   INTRAMEDULLARY (IM) NAIL INTERTROCHANTERIC Left 01/14/2021   Procedure: INTRAMEDULLARY (IM) NAIL INTERTROCHANTRIC;  Surgeon: Lovell Sheehan, MD;  Location: [facility];  Service: Orthopedics;  Laterality: Left;   KYPHOPLASTY N/A 08/20/2018   Procedure: [id number];  Surgeon: Hessie Knows, MD;  Location: [facility];  Service: Orthopedics;  Laterality: N/A;   LOWER EXTREMITY ANGIOGRAPHY Right 12/18/2019   Procedure: LOWER EXTREMITY ANGIOGRAPHY;  Surgeon: Algernon Huxley, MD;  Location: [place] [facility];  Service: Cardiovascular;  Laterality: Right;   LOWER EXTREMITY ANGIOGRAPHY Right 12/02/2020  Procedure: LOWER EXTREMITY ANGIOGRAPHY;  Surgeon: Algernon Huxley, MD;  Location: [place] [facility];  Service: Cardiovascular;  Laterality: Right;   LOWER EXTREMITY ANGIOGRAPHY Right 12/23/2020   Procedure: Lower Extremity Angiography;  Surgeon: Algernon Huxley, MD;  Location: [place] [facility];  Service: Cardiovascular;  Laterality: Right;   LOWER EXTREMITY ANGIOGRAPHY Right 12/24/2020   Procedure: Lower Extremity Angiography;  Surgeon: Algernon Huxley, MD;  Location: [place] [facility];  Service: Cardiovascular;  Laterality: Right;   NECK SURGERY     NOSE SURGERY     ROTATOR CUFF REPAIR     x2   SHOULDER ARTHROSCOPY  12/07/2011   Procedure: ARTHROSCOPY SHOULDER;  Surgeon: Ninetta Lights, MD;  Location: [place];  Service: Orthopedics;   Laterality: Right;  Debridement Partial Cuff Tear, Release Coracoacromial Ligament   SHOULDER SURGERY  12/07/2011   right    Prior to Admission medications   Medication Sig Start Date End Date Taking? Authorizing Provider  albuterol (PROVENTIL) (2.5 MG/3ML) 0.083% nebulizer solution Take 3 mLs (2.5 mg total) by nebulization every 4 (four) hours as needed for wheezing or shortness of breath. 10/16/21 10/16/22  Naaman Plummer, MD  albuterol (VENTOLIN HFA) 108 (90 Base) MCG/ACT inhaler Inhale 2 puffs into the lungs every 6 (six) hours as needed for wheezing or shortness of breath. 10/16/21   Naaman Plummer, MD  ALPRAZolam Duanne Moron) 0.5 MG tablet Take 1 tablet (0.5 mg total) by mouth every 6 (six) hours as needed for anxiety. 01/20/21   Jennye Boroughs, MD  apixaban (ELIQUIS) 5 MG TABS tablet Take 1 tablet (5 mg total) by mouth 2 (two) times daily. 12/31/20 07/13/21  Sidney Ace, MD  aspirin EC 81 MG tablet Take 1 tablet (81 mg total) by mouth daily. 12/02/20   Algernon Huxley, MD  atorvastatin (LIPITOR) 10 MG tablet Take 1 tablet (10 mg total) by mouth daily. 12/18/19 07/13/21  Algernon Huxley, MD  buPROPion (WELLBUTRIN SR) 150 MG 12 hr tablet Take 150 mg by mouth daily.     [provider]  COMBIVENT RESPIMAT 20-100 MCG/ACT AERS respimat Inhale 1-2 puffs into the lungs 4 (four) times daily. 10/16/21   Naaman Plummer, MD  escitalopram (LEXAPRO) 10 MG tablet Take 10 mg by mouth daily in the afternoon. 10/15/13   [provider]  Fluticasone-Salmeterol (ADVAIR DISKUS) 250-50 MCG/DOSE AEPB Inhale 1 puff into the lungs 2 (two) times daily. 12/31/20 07/13/21  Sidney Ace, MD  levothyroxine (SYNTHROID, LEVOTHROID) 88 MCG tablet Take 88 mcg by mouth daily before breakfast.     [provider]  montelukast (SINGULAIR) 10 MG tablet Take 10 mg by mouth daily.    [provider]  oxyCODONE (OXY IR/ROXICODONE) 5 MG immediate release tablet Take 1 tablet (5 mg total) by mouth 2  (two) times daily as needed for severe pain. Must last 30 days 10/11/21 11/10/21  Milinda Pointer, MD  predniSONE (DELTASONE) 50 MG tablet Take 1 tablet (50 mg total) by mouth daily with breakfast for 3 days. 10/16/21 10/19/21  Naaman Plummer, MD  tizanidine (ZANAFLEX) 2 MG capsule Take 1 capsule (2 mg total) by mouth 3 (three) times daily as needed for muscle spasms. 02/17/21 07/13/21  Milinda Pointer, MD  traZODone (DESYREL) 100 MG tablet Take 1 tablet (100 mg total) by mouth at bedtime. Patient taking differently: Take 200 mg by mouth at bedtime. 08/21/18   Gladstone Lighter, MD    Allergies Patient has  no known allergies.  Family History  Problem Relation Age of Onset   Aneurysm Other     Social History Social History   Tobacco Use   Smoking status: Some Days    Packs/day: 0.00    Types: Cigarettes   Smokeless tobacco: Never   Tobacco comments:    pt states 2 cigs per month   Substance Use Topics   Alcohol use: No   Drug use: No    Review of Systems   Review of Systems  Constitutional:  Negative for chills and fever.  Respiratory:  Positive for cough. Negative for shortness of breath.   Musculoskeletal:  Positive for joint swelling.  Skin:  Positive for color change and wound.  All other systems reviewed and are negative.  Physical Exam Updated Vital Signs BP 113/74 (BP Location: Right Arm)   Pulse (!) 125   Temp 98.5 F (36.9 C) (Oral)   Resp 20   Ht 5\' 4"  (1.626 m)   Wt 50 kg   SpO2 95%   BMI 18.92 kg/m   Physical Exam Vitals and nursing note reviewed.  Constitutional:      General: She is not in acute distress.    Appearance: Normal appearance.  HENT:     Head: Normocephalic and atraumatic.  Eyes:     General: No scleral icterus.    Conjunctiva/sclera: Conjunctivae normal.  Pulmonary:     Effort: Pulmonary effort is normal. No respiratory distress.     Breath sounds: No stridor.  Musculoskeletal:        General: No deformity or signs of  injury.     Cervical back: Normal range of motion.     Comments: Status post amputation of the second third and fifth digits on the right foot Fourth digit with color change and tenderness to palpation, no crepitus Mild erythema of the foot Tenderness to palpation over the fourth digit and the base of the prior amputated second third and fifth digits as well  No palpable DP or PT pulses in either foot  Skin:    General: Skin is dry.     Coloration: Skin is not jaundiced or pale.  Neurological:     General: No focal deficit present.     Mental Status: She is alert and oriented to person, place, and time. Mental status is at baseline.  Psychiatric:        Mood and Affect: Mood normal.        Behavior: Behavior normal.        LABS (all labs ordered are listed, but only abnormal results are displayed)  Labs Reviewed  COMPREHENSIVE METABOLIC PANEL - Abnormal; Notable for the following components:      Result Value   Glucose, Bld 103 (*)    All other components within normal limits  CBC WITH DIFFERENTIAL/PLATELET - Abnormal; Notable for the following components:   WBC 13.6 (*)    RDW 15.6 (*)    Platelets 477 (*)    Neutro Abs 9.8 (*)    Monocytes Absolute 1.1 (*)    Abs Immature Granulocytes 0.08 (*)    All other components within normal limits  CULTURE, BLOOD (ROUTINE X 2)  CULTURE, BLOOD (ROUTINE X 2)  RESP PANEL BY RT-PCR (FLU A&B, COVID) ARPGX2  LACTIC ACID, PLASMA  LACTIC ACID, PLASMA  URINALYSIS, ROUTINE W REFLEX MICROSCOPIC   ____________________________________________  EKG  N/a ____________________________________________  RADIOLOGY Almeta Monas, personally viewed and evaluated these images (plain radiographs) as  part of my medical decision making, as well as reviewing the written report by the radiologist.  ED MD interpretation: I reviewed the x-ray of the right foot which shows some soft tissue gas in the fourth digit but no  osteomyelitis    ____________________________________________   PROCEDURES  Procedure(s) performed (including Critical Care):  Procedures   ____________________________________________   INITIAL IMPRESSION / ASSESSMENT AND PLAN / ED COURSE  75 year old female with history of peripheral vascular disease presenting with concern for infection of her fourth toe on the right foot.  Has had color change and worsening pain over the last 2 weeks no systemic symptoms.  On exam there is some darkening of the toe with some erythema of the foot, small early gangrene.  There is no drainage but it is very tender.  On x-ray there is some soft tissue gas and there is no open wounds at this is certainly concerning.  She has no pulses in either foot likely due to her underlying vascular disease.  Discussed with Dr. Luana Shu on-call podiatrist as the patient was sent in by Dr. Vickki Muff who saw her today and he recommends admission with a vascular consult prior to intervention.  We will cover with Vanco and Zosyn.  Her labs are notable for leukocytosis but normal lactate.  She is tachycardic but otherwise well-appearing.  Discussed with hospitalist will admit the patient.     ____________________________________________   FINAL CLINICAL IMPRESSION(S) / ED DIAGNOSES  Final diagnoses:  Toe infection     ED Discharge Orders     None        Note:  This document was prepared using Dragon voice recognition software and may include unintentional dictation errors.    Rada Hay, MD 10/18/21 [phone]

## 2021-10-18 NOTE — ED Triage Notes (Signed)
Pt to ED from [facility] for infection and gangrene to toe on right foot. States has already had toes amputated. Not diabetic.  Pt reports chest cold for "awhile"

## 2021-10-19 ENCOUNTER — Other Ambulatory Visit (INDEPENDENT_AMBULATORY_CARE_PROVIDER_SITE_OTHER): Payer: Self-pay | Admitting: Vascular Surgery

## 2021-10-19 ENCOUNTER — Encounter: Payer: Self-pay | Admitting: Internal Medicine

## 2021-10-19 DIAGNOSIS — E039 Hypothyroidism, unspecified: Secondary | ICD-10-CM

## 2021-10-19 DIAGNOSIS — I2510 Atherosclerotic heart disease of native coronary artery without angina pectoris: Secondary | ICD-10-CM | POA: Diagnosis present

## 2021-10-19 DIAGNOSIS — L03031 Cellulitis of right toe: Secondary | ICD-10-CM | POA: Diagnosis present

## 2021-10-19 DIAGNOSIS — I10 Essential (primary) hypertension: Secondary | ICD-10-CM | POA: Diagnosis present

## 2021-10-19 DIAGNOSIS — E876 Hypokalemia: Secondary | ICD-10-CM | POA: Diagnosis present

## 2021-10-19 DIAGNOSIS — R0602 Shortness of breath: Secondary | ICD-10-CM | POA: Diagnosis present

## 2021-10-19 DIAGNOSIS — M869 Osteomyelitis, unspecified: Secondary | ICD-10-CM | POA: Diagnosis present

## 2021-10-19 DIAGNOSIS — J449 Chronic obstructive pulmonary disease, unspecified: Secondary | ICD-10-CM

## 2021-10-19 DIAGNOSIS — I70223 Atherosclerosis of native arteries of extremities with rest pain, bilateral legs: Secondary | ICD-10-CM | POA: Diagnosis not present

## 2021-10-19 DIAGNOSIS — G894 Chronic pain syndrome: Secondary | ICD-10-CM | POA: Diagnosis present

## 2021-10-19 DIAGNOSIS — I70261 Atherosclerosis of native arteries of extremities with gangrene, right leg: Secondary | ICD-10-CM | POA: Diagnosis present

## 2021-10-19 DIAGNOSIS — L0390 Cellulitis, unspecified: Secondary | ICD-10-CM | POA: Diagnosis present

## 2021-10-19 DIAGNOSIS — Z20822 Contact with and (suspected) exposure to covid-19: Secondary | ICD-10-CM | POA: Diagnosis present

## 2021-10-19 DIAGNOSIS — R0989 Other specified symptoms and signs involving the circulatory and respiratory systems: Secondary | ICD-10-CM | POA: Diagnosis not present

## 2021-10-19 DIAGNOSIS — E43 Unspecified severe protein-calorie malnutrition: Secondary | ICD-10-CM | POA: Diagnosis present

## 2021-10-19 DIAGNOSIS — E785 Hyperlipidemia, unspecified: Secondary | ICD-10-CM | POA: Diagnosis present

## 2021-10-19 DIAGNOSIS — H9190 Unspecified hearing loss, unspecified ear: Secondary | ICD-10-CM | POA: Diagnosis present

## 2021-10-19 DIAGNOSIS — M48061 Spinal stenosis, lumbar region without neurogenic claudication: Secondary | ICD-10-CM | POA: Diagnosis present

## 2021-10-19 DIAGNOSIS — F32A Depression, unspecified: Secondary | ICD-10-CM | POA: Diagnosis present

## 2021-10-19 DIAGNOSIS — F419 Anxiety disorder, unspecified: Secondary | ICD-10-CM | POA: Diagnosis present

## 2021-10-19 DIAGNOSIS — Z681 Body mass index (BMI) 19 or less, adult: Secondary | ICD-10-CM | POA: Diagnosis not present

## 2021-10-19 DIAGNOSIS — Z981 Arthrodesis status: Secondary | ICD-10-CM | POA: Diagnosis not present

## 2021-10-19 DIAGNOSIS — F17210 Nicotine dependence, cigarettes, uncomplicated: Secondary | ICD-10-CM | POA: Diagnosis present

## 2021-10-19 DIAGNOSIS — K219 Gastro-esophageal reflux disease without esophagitis: Secondary | ICD-10-CM | POA: Diagnosis present

## 2021-10-19 DIAGNOSIS — F17200 Nicotine dependence, unspecified, uncomplicated: Secondary | ICD-10-CM

## 2021-10-19 DIAGNOSIS — Z23 Encounter for immunization: Secondary | ICD-10-CM | POA: Diagnosis not present

## 2021-10-19 DIAGNOSIS — L03115 Cellulitis of right lower limb: Secondary | ICD-10-CM | POA: Diagnosis present

## 2021-10-19 DIAGNOSIS — J441 Chronic obstructive pulmonary disease with (acute) exacerbation: Secondary | ICD-10-CM | POA: Diagnosis present

## 2021-10-19 DIAGNOSIS — L97519 Non-pressure chronic ulcer of other part of right foot with unspecified severity: Secondary | ICD-10-CM | POA: Diagnosis present

## 2021-10-19 DIAGNOSIS — T82856A Stenosis of peripheral vascular stent, initial encounter: Secondary | ICD-10-CM | POA: Diagnosis present

## 2021-10-19 LAB — BASIC METABOLIC PANEL
Anion gap: 8 (ref 5–15)
BUN: 11 mg/dL (ref 8–23)
CO2: 23 mmol/L (ref 22–32)
Calcium: 8.8 mg/dL — ABNORMAL LOW (ref 8.9–10.3)
Chloride: 105 mmol/L (ref 98–111)
Creatinine, Ser: 0.61 mg/dL (ref 0.44–1.00)
GFR, Estimated: >60 mL/min (ref >60)
Glucose, Bld: 85 mg/dL (ref 70–99)
Potassium: 3.4 mmol/L — ABNORMAL LOW (ref 3.5–5.1)
Sodium: 136 mmol/L (ref 135–145)

## 2021-10-19 LAB — CBC
HCT: 37.4 % (ref 36.0–46.0)
Hemoglobin: 11.9 g/dL — ABNORMAL LOW (ref 12.0–15.0)
MCH: 27.9 pg (ref 26.0–34.0)
MCHC: 31.8 g/dL (ref 30.0–36.0)
MCV: 87.8 fL (ref 80.0–100.0)
Platelets: 342 10*3/uL (ref 150–400)
RBC: 4.26 MIL/uL (ref 3.87–5.11)
RDW: 15.7 % — ABNORMAL HIGH (ref 11.5–15.5)
WBC: 8.7 10*3/uL (ref 4.0–10.5)
nRBC: 0 % (ref 0.0–0.2)

## 2021-10-19 LAB — URINALYSIS, ROUTINE W REFLEX MICROSCOPIC
Bilirubin Urine: NEGATIVE
Glucose, UA: NEGATIVE mg/dL
Hgb urine dipstick: NEGATIVE
Ketones, ur: NEGATIVE mg/dL
Nitrite: NEGATIVE
Protein, ur: NEGATIVE mg/dL
Specific Gravity, Urine: <1.005 — ABNORMAL LOW (ref 1.005–1.030)
pH: 6 (ref 5.0–8.0)

## 2021-10-19 LAB — URINALYSIS, MICROSCOPIC (REFLEX)
Bacteria, UA: NONE SEEN
Squamous Epithelial / HPF: NONE SEEN (ref 0–5)

## 2021-10-19 LAB — MAGNESIUM: Magnesium: 1.7 mg/dL (ref 1.7–2.4)

## 2021-10-19 LAB — LACTIC ACID, PLASMA: Lactic Acid, Venous: 1.4 mmol/L (ref 0.5–1.9)

## 2021-10-19 MED ORDER — PIPERACILLIN-TAZOBACTAM 3.375 G IVPB
3.3750 g | Freq: Three times a day (TID) | INTRAVENOUS | Status: DC
  Administered 2021-10-19 – 2021-10-23 (×12): 3.375 g via INTRAVENOUS
  Filled 2021-10-19 (×12): qty 50

## 2021-10-19 MED ORDER — MONTELUKAST SODIUM 10 MG PO TABS
10.0000 mg | ORAL_TABLET | Freq: Every day | ORAL | Status: DC
  Administered 2021-10-19: 17:00:00 10 mg via ORAL
  Filled 2021-10-19: qty 1

## 2021-10-19 MED ORDER — ALBUTEROL SULFATE HFA 108 (90 BASE) MCG/ACT IN AERS: 2.0000 | INHALATION_SPRAY | Freq: Four times a day (QID) | RESPIRATORY_TRACT | Status: DC | PRN

## 2021-10-19 MED ORDER — PREDNISONE 20 MG PO TABS
50.0000 mg | ORAL_TABLET | Freq: Once | ORAL | Status: AC
  Administered 2021-10-19: 50 mg via ORAL
  Filled 2021-10-19: qty 1

## 2021-10-19 MED ORDER — TRAZODONE HCL 100 MG PO TABS
200.0000 mg | ORAL_TABLET | Freq: Every day | ORAL | Status: DC
  Administered 2021-10-19 – 2021-10-22 (×4): 200 mg via ORAL
  Filled 2021-10-19 (×4): qty 2

## 2021-10-19 MED ORDER — LACTATED RINGERS IV SOLN: INTRAVENOUS | Status: DC

## 2021-10-19 MED ORDER — LEVOTHYROXINE SODIUM 88 MCG PO TABS
88.0000 ug | ORAL_TABLET | Freq: Every day | ORAL | Status: DC
  Administered 2021-10-19 – 2021-10-23 (×4): 88 ug via ORAL
  Filled 2021-10-19 (×5): qty 1

## 2021-10-19 MED ORDER — BISACODYL 5 MG PO TBEC
5.0000 mg | DELAYED_RELEASE_TABLET | Freq: Every day | ORAL | Status: DC | PRN
  Administered 2021-10-20: 5 mg via ORAL
  Filled 2021-10-19: qty 1

## 2021-10-19 MED ORDER — APIXABAN 5 MG PO TABS
5.0000 mg | ORAL_TABLET | Freq: Two times a day (BID) | ORAL | Status: DC
  Administered 2021-10-19: 5 mg via ORAL
  Filled 2021-10-19: qty 1

## 2021-10-19 MED ORDER — ALBUTEROL SULFATE (2.5 MG/3ML) 0.083% IN NEBU: 2.5000 mg | INHALATION_SOLUTION | RESPIRATORY_TRACT | Status: DC | PRN

## 2021-10-19 MED ORDER — IPRATROPIUM-ALBUTEROL 0.5-2.5 (3) MG/3ML IN SOLN
3.0000 mL | Freq: Four times a day (QID) | RESPIRATORY_TRACT | Status: DC
Start: 2021-10-19 — End: 2021-10-21
  Administered 2021-10-19 – 2021-10-21 (×8): 3 mL via RESPIRATORY_TRACT
  Filled 2021-10-19 (×8): qty 3

## 2021-10-19 MED ORDER — ALPRAZOLAM 0.5 MG PO TABS
0.5000 mg | ORAL_TABLET | Freq: Four times a day (QID) | ORAL | Status: DC | PRN
  Administered 2021-10-19 – 2021-10-23 (×12): 0.5 mg via ORAL
  Filled 2021-10-19 (×12): qty 1

## 2021-10-19 MED ORDER — ACETAMINOPHEN 650 MG RE SUPP: 650.0000 mg | Freq: Four times a day (QID) | RECTAL | Status: DC | PRN

## 2021-10-19 MED ORDER — SODIUM CHLORIDE 0.9% FLUSH
3.0000 mL | Freq: Two times a day (BID) | INTRAVENOUS | Status: DC
  Administered 2021-10-19 – 2021-10-23 (×6): 3 mL via INTRAVENOUS

## 2021-10-19 MED ORDER — POLYETHYLENE GLYCOL 3350 17 G PO PACK
17.0000 g | PACK | Freq: Every day | ORAL | Status: DC | PRN
  Administered 2021-10-20: 17 g via ORAL
  Filled 2021-10-19: qty 1

## 2021-10-19 MED ORDER — IPRATROPIUM-ALBUTEROL 20-100 MCG/ACT IN AERS: 1.0000 | INHALATION_SPRAY | Freq: Four times a day (QID) | RESPIRATORY_TRACT | Status: DC

## 2021-10-19 MED ORDER — POTASSIUM CHLORIDE CRYS ER 20 MEQ PO TBCR
40.0000 meq | EXTENDED_RELEASE_TABLET | ORAL | Status: DC
  Filled 2021-10-19: qty 2

## 2021-10-19 MED ORDER — PIPERACILLIN-TAZOBACTAM 3.375 G IVPB 30 MIN: 3.3750 g | Freq: Four times a day (QID) | INTRAVENOUS | Status: DC

## 2021-10-19 MED ORDER — ASPIRIN EC 81 MG PO TBEC
81.0000 mg | DELAYED_RELEASE_TABLET | Freq: Every day | ORAL | Status: DC
  Administered 2021-10-19 – 2021-10-23 (×4): 81 mg via ORAL
  Filled 2021-10-19 (×5): qty 1

## 2021-10-19 MED ORDER — POTASSIUM CHLORIDE 20 MEQ PO PACK
40.0000 meq | PACK | ORAL | Status: AC
  Administered 2021-10-19: 40 meq via ORAL
  Filled 2021-10-19: qty 2

## 2021-10-19 MED ORDER — ACETAMINOPHEN 325 MG PO TABS
650.0000 mg | ORAL_TABLET | Freq: Four times a day (QID) | ORAL | Status: DC | PRN
  Administered 2021-10-21: 650 mg via ORAL
  Filled 2021-10-19: qty 2

## 2021-10-19 MED ORDER — OXYCODONE HCL 5 MG PO TABS
5.0000 mg | ORAL_TABLET | ORAL | Status: DC | PRN
  Administered 2021-10-19 – 2021-10-23 (×10): 5 mg via ORAL
  Filled 2021-10-19 (×10): qty 1

## 2021-10-19 MED ORDER — VANCOMYCIN HCL 750 MG/150ML IV SOLN
750.0000 mg | INTRAVENOUS | Status: DC
Start: 2021-10-19 — End: 2021-10-23
  Administered 2021-10-19 – 2021-10-23 (×4): 750 mg via INTRAVENOUS
  Filled 2021-10-19 (×4): qty 150

## 2021-10-19 MED ORDER — SODIUM CHLORIDE 0.9 % IV SOLN: INTRAVENOUS | Status: DC

## 2021-10-19 MED ORDER — DOCUSATE SODIUM 100 MG PO CAPS
100.0000 mg | ORAL_CAPSULE | Freq: Two times a day (BID) | ORAL | Status: DC
  Administered 2021-10-19 – 2021-10-23 (×8): 100 mg via ORAL
  Filled 2021-10-19 (×9): qty 1

## 2021-10-19 MED ORDER — HYDROMORPHONE HCL 2 MG PO TABS
1.0000 mg | ORAL_TABLET | Freq: Four times a day (QID) | ORAL | Status: DC | PRN
  Administered 2021-10-20 – 2021-10-21 (×3): 1 mg via ORAL
  Filled 2021-10-19 (×4): qty 1

## 2021-10-19 MED ORDER — ONDANSETRON HCL 4 MG/2ML IJ SOLN: 4.0000 mg | Freq: Four times a day (QID) | INTRAMUSCULAR | Status: DC | PRN

## 2021-10-19 MED ORDER — ATORVASTATIN CALCIUM 10 MG PO TABS
10.0000 mg | ORAL_TABLET | Freq: Every day | ORAL | Status: DC
  Administered 2021-10-19 – 2021-10-23 (×4): 10 mg via ORAL
  Filled 2021-10-19 (×5): qty 1

## 2021-10-19 MED ORDER — MOMETASONE FURO-FORMOTEROL FUM 200-5 MCG/ACT IN AERO
2.0000 | INHALATION_SPRAY | Freq: Two times a day (BID) | RESPIRATORY_TRACT | Status: DC
  Administered 2021-10-19 – 2021-10-23 (×7): 2 via RESPIRATORY_TRACT
  Filled 2021-10-19 (×2): qty 8.8

## 2021-10-19 MED ORDER — BUPROPION HCL ER (SR) 150 MG PO TB12
150.0000 mg | ORAL_TABLET | Freq: Every day | ORAL | Status: DC
  Administered 2021-10-19 – 2021-10-23 (×4): 150 mg via ORAL
  Filled 2021-10-19 (×5): qty 1

## 2021-10-19 MED ORDER — HYDRALAZINE HCL 20 MG/ML IJ SOLN: 5.0000 mg | INTRAMUSCULAR | Status: DC | PRN

## 2021-10-19 MED ORDER — NICOTINE 14 MG/24HR TD PT24: 14.0000 mg | MEDICATED_PATCH | Freq: Every day | TRANSDERMAL | Status: DC | PRN

## 2021-10-19 MED ORDER — MORPHINE SULFATE (PF) 2 MG/ML IV SOLN
2.0000 mg | INTRAVENOUS | Status: DC | PRN
  Administered 2021-10-19 – 2021-10-21 (×10): 2 mg via INTRAVENOUS
  Filled 2021-10-19 (×10): qty 1

## 2021-10-19 MED ORDER — ONDANSETRON HCL 4 MG PO TABS: 4.0000 mg | ORAL_TABLET | Freq: Four times a day (QID) | ORAL | Status: DC | PRN

## 2021-10-19 MED ORDER — ESCITALOPRAM OXALATE 10 MG PO TABS
10.0000 mg | ORAL_TABLET | Freq: Every day | ORAL | Status: DC
  Administered 2021-10-20 – 2021-10-22 (×2): 10 mg via ORAL
  Filled 2021-10-19 (×5): qty 1

## 2021-10-19 NOTE — ED Notes (Signed)
Levada Dy RN aware of assigned bed

## 2021-10-19 NOTE — Progress Notes (Addendum)
Pharmacy Antibiotic Note  [name] is a 75 y.o. female admitted on 10/18/2021 with osteomyelitis.  Pharmacy has been consulted for Vancomycin dosing.  Plan: Pt given initial dose of Vancomycin 1 gm. Vancomycin 750 mg IV Q 24 hrs.  Goal AUC 400-550. Expected AUC: 464.8 SCr used: 0.8 (11/29 Scr: 0.61) TBW 50 kg < IBW 54.7  Pharmacy will continue to follow and adjust dosing when warranted.   Height: 5\' 4"  (162.6 cm) Weight: 50 kg (110 lb 3.7 oz) IBW/kg (Calculated) : 54.7  Temp (24hrs), Avg:98.5 F (36.9 C), Min:98.5 F (36.9 C), Max:98.5 F (36.9 C)  Recent Labs  Lab 10/16/21 0603 10/18/21 1508  WBC 10.3 13.6*  CREATININE 0.63 0.61  LATICACIDVEN  --  1.5    Estimated Creatinine Clearance: 48.7 mL/min (by C-G formula based on SCr of 0.61 mg/dL).    No Known Allergies  Antimicrobials this admission: 11/30 Zosyn >>  11/29 Vancomycin >>   Microbiology results: 11/29 BCx: Pending  Thank you for allowing pharmacy to be a part of this patient's care.  Renda Rolls, PharmD, MBA 10/19/2021 2:12 AM

## 2021-10-19 NOTE — Progress Notes (Addendum)
PROGRESS NOTE  [name] BDZ:[id number] DOB: Jul 26, 1946   PCP: Albina Billet, MD  Patient is from: Home.  DOA: 10/18/2021 LOS: 0  Chief complaints:  Chief Complaint  Patient presents with   Wound Infection     Brief Narrative / Interim history: 75 year old F with PMH of PVD s/p thrombectomy and stent, nonhealing right foot ulcer/gangrene, CAD, COPD, HTN, depression, hypothyroidism, chronic pain and tobacco use disorder sent to ED by podiatry for right foot ulceration and possible cellulitis.  Started on IV Zosyn.  Vascular surgery consulted  Subjective: Seen and examined earlier this morning.  No major events overnight of this morning.  Pain improved after pain medication.  She is eager to go home but understands the need to stay in-house for further evaluation and treatment.  She denies chest pain or dyspnea.  Objective: Vitals:   10/19/21 1100 10/19/21 1200 10/19/21 1300 10/19/21 1400  BP: (!) 152/61 (!) 142/96  (!) 136/105  Pulse: 99 98 (!) 113 (!) 129  Resp: 18 (!) 25 (!) 22 15  Temp:      TempSrc:      SpO2: 96% 98% 97%   Weight:      Height:        Intake/Output Summary (Last 24 hours) at 10/19/2021 1530 Last data filed at 10/19/2021 0009 Gross per 24 hour  Intake 200 ml  Output --  Net 200 ml   Filed Weights   10/18/21 1505  Weight: 50 kg    Examination:  GENERAL: Frail looking elderly female.  No apparent distress. HEENT: MMM.  Vision and hearing grossly intact.  NECK: Supple.  No apparent JVD.  RESP: 97% on 2 L.  No IWOB.  Fair aeration bilaterally. CVS:  RRR. Heart sounds normal.  ABD/GI/GU: BS+. Abd soft, NTND.  MSK/EXT: Significant muscle mass and subcu fat loss.  Missing second and third right toes.  Ulceration at the tip of right fourth toe.  Increased warmth to touch in her right foot.  Not able to palpate DP pulse. SKIN: As above. NEURO: Awake, alert and oriented appropriately.  No apparent focal neuro deficit. PSYCH: Calm. Normal affect.    Procedures:  None  Microbiology summarized: TMHDQ-22 and influenza PCR nonreactive. Blood cultures NGTD.  Assessment & Plan: Right foot ulceration inpatient with PAD s/p mechanical thrombectomy and stent angioplasty of the right lower extremity and third right toe amputation in 12/2020 and second right toe amputation in 04/2021 Possible right foot cellulitis-increased warmth to touch on exam. -Continue IV antibiotics for now -Plan for vascular evaluation and revascularization if needed -Encouraged tobacco cessation.  Declined nicotine patch. -Hold Eliquis.  On Eliquis for PAD?  No report of arrhythmia -Continue aspirin and statin  Chronic COPD/tobacco use disorder: Stable. -Continue breathing treatments -Encourage tobacco cessation  History of CAD/HTN -Started on aspirin as above.  Hypothyroidism: -Continue home Synthroid.  Anxiety/depression/chronic pain: Stable. -Continue home medications  Hypokalemia/hypomagnesemia -Replenish and recheck.  Severe malnutrition: As evidenced by significant muscle mass and subcu fat loss and low BMI Body mass index is 18.92 kg/m.  -Consult dietitian.       DVT prophylaxis:    Received Eliquis this morning.  Code Status: Full code Family Communication: Patient and/or RN. Available if any question.  Level of care: Med-Surg Status is: Inpatient  Remains inpatient appropriate because: Critical limb ischemia requiring evaluation and revascularization and possible right foot cellulitis       Consultants:  Podiatry Vascular surgery   Sch Meds:  Scheduled  Meds:  aspirin EC  81 mg Oral Daily   atorvastatin  10 mg Oral Daily   buPROPion  150 mg Oral Daily   docusate sodium  100 mg Oral BID   escitalopram  10 mg Oral Q1500   ipratropium-albuterol  3 mL Nebulization QID   levothyroxine  88 mcg Oral Q0600   mometasone-formoterol  2 puff Inhalation BID   montelukast  10 mg Oral q1800   potassium chloride  40 mEq Oral Q4H    sodium chloride flush  3 mL Intravenous Q12H   traZODone  200 mg Oral QHS   Continuous Infusions:  [START ON 10/20/2021] sodium chloride     piperacillin-tazobactam (ZOSYN)  IV Stopped (10/19/21 1315)   vancomycin     PRN Meds:.acetaminophen **OR** acetaminophen, albuterol, ALPRAZolam, bisacodyl, hydrALAZINE, HYDROmorphone, morphine injection, nicotine, ondansetron **OR** ondansetron (ZOFRAN) IV, oxyCODONE, polyethylene glycol  Antimicrobials: Anti-infectives (From admission, onward)    Start     Dose/Rate Route Frequency Ordered Stop   10/19/21 2300  vancomycin (VANCOREADY) IVPB 750 mg/150 mL        750 mg 150 mL/hr over 60 Minutes Intravenous Every 24 hours 10/19/21 0214     10/19/21 0600  piperacillin-tazobactam (ZOSYN) IVPB 3.375 g        3.375 g 12.5 mL/hr over 240 Minutes Intravenous Every 8 hours 10/19/21 0058     10/19/21 0100  piperacillin-tazobactam (ZOSYN) IVPB 3.375 g  Status:  Discontinued        3.375 g 100 mL/hr over 30 Minutes Intravenous Every 6 hours 10/19/21 0054 10/19/21 0057   10/18/21 2230  piperacillin-tazobactam (ZOSYN) IVPB 3.375 g        3.375 g 100 mL/hr over 30 Minutes Intravenous  Once 10/18/21 2216 10/19/21 0040   10/18/21 2200  vancomycin (VANCOCIN) IVPB 1000 mg/200 mL premix        1,000 mg 200 mL/hr over 60 Minutes Intravenous  Once 10/18/21 2155 10/19/21 0009        I have personally reviewed the following labs and images: CBC: Recent Labs  Lab 10/16/21 0603 10/18/21 1508 10/19/21 0626  WBC 10.3 13.6* 8.7  NEUTROABS  --  9.8*  --   HGB 12.4 12.3 11.9*  HCT 37.6 38.0 37.4  MCV 87.4 88.6 87.8  PLT 461* 477* 342   BMP &GFR Recent Labs  Lab 10/16/21 0603 10/18/21 1508 10/19/21 0626  NA 138 135 136  K 3.9 3.7 3.4*  CL 105 99 105  CO2 24 26 23   GLUCOSE 122* 103* 85  BUN 16 12 11   CREATININE 0.63 0.61 0.61  CALCIUM 9.0 9.1 8.8*  MG  --   --  1.7   Estimated Creatinine Clearance: 48.7 mL/min (by C-G formula based on SCr of 0.61  mg/dL). Liver & Pancreas: Recent Labs  Lab 10/16/21 0603 10/18/21 1508  AST 25 27  ALT 50* 36  ALKPHOS 134* 109  BILITOT 0.7 0.7  PROT 6.4* 7.2  ALBUMIN 3.7 3.8   No results for input(s): LIPASE, AMYLASE in the last 168 hours. No results for input(s): AMMONIA in the last 168 hours. Diabetic: No results for input(s): HGBA1C in the last 72 hours. No results for input(s): GLUCAP in the last 168 hours. Cardiac Enzymes: No results for input(s): CKTOTAL, CKMB, CKMBINDEX, TROPONINI in the last 168 hours. No results for input(s): PROBNP in the last 8760 hours. Coagulation Profile: No results for input(s): INR, PROTIME in the last 168 hours. Thyroid Function Tests: No results for input(s): TSH,  T4TOTAL, FREET4, T3FREE, THYROIDAB in the last 72 hours. Lipid Profile: No results for input(s): CHOL, HDL, LDLCALC, TRIG, CHOLHDL, LDLDIRECT in the last 72 hours. Anemia Panel: No results for input(s): VITAMINB12, FOLATE, FERRITIN, TIBC, IRON, RETICCTPCT in the last 72 hours. Urine analysis:    Component Value Date/Time   COLORURINE YELLOW 10/19/2021 0011   APPEARANCEUR CLEAR 10/19/2021 0011   LABSPEC <1.005 (L) 10/19/2021 0011   PHURINE 6.0 10/19/2021 0011   GLUCOSEU NEGATIVE 10/19/2021 0011   HGBUR NEGATIVE 10/19/2021 0011   BILIRUBINUR NEGATIVE 10/19/2021 0011   KETONESUR NEGATIVE 10/19/2021 0011   PROTEINUR NEGATIVE 10/19/2021 0011   NITRITE NEGATIVE 10/19/2021 0011   LEUKOCYTESUR SMALL (A) 10/19/2021 0011   Sepsis Labs: Invalid input(s): PROCALCITONIN, [place]  Microbiology: Recent Results (from the past 240 hour(s))  Blood culture (routine x 2)     Status: None (Preliminary result)   Collection Time: 10/18/21 10:42 PM   Specimen: BLOOD  Result Value Ref Range Status   Specimen Description BLOOD RIGHT ASSIST CONTROL  Final   Special Requests   Final    BOTTLES DRAWN AEROBIC AND ANAEROBIC Blood Culture adequate volume   Culture   Final    NO GROWTH < 12 HOURS Performed  at [facility], [address] [place], [place] [address]    Report Status PENDING  Incomplete  Blood culture (routine x 2)     Status: None (Preliminary result)   Collection Time: 10/18/21 10:47 PM   Specimen: BLOOD  Result Value Ref Range Status   Specimen Description BLOOD RIGHT FOREARM  Final   Special Requests   Final    BOTTLES DRAWN AEROBIC AND ANAEROBIC Blood Culture adequate volume   Culture   Final    NO GROWTH < 12 HOURS Performed at [facility], [address], [place], [place] [address]    Report Status PENDING  Incomplete  Resp Panel by RT-PCR (Flu A&B, Covid) Nasopharyngeal Swab     Status: None   Collection Time: 10/18/21 10:57 PM   Specimen: Nasopharyngeal Swab; Nasopharyngeal(NP) swabs in vial transport medium  Result Value Ref Range Status   SARS Coronavirus 2 by RT PCR NEGATIVE NEGATIVE Final    Comment: (NOTE) SARS-CoV-2 target nucleic acids are NOT DETECTED.  The SARS-CoV-2 RNA is generally detectable in upper respiratory specimens during the acute phase of infection. The lowest concentration of SARS-CoV-2 viral copies this assay can detect is 138 copies/mL. A negative result does not preclude SARS-Cov-2 infection and should not be used as the sole basis for treatment or other patient management decisions. A negative result may occur with  improper specimen collection/handling, submission of specimen other than nasopharyngeal swab, presence of viral mutation(s) within the areas targeted by this assay, and inadequate number of viral copies(<138 copies/mL). A negative result must be combined with clinical observations, patient history, and epidemiological information. The expected result is Negative.  Fact Sheet for Patients:  EntrepreneurPulse.com.au  Fact Sheet for Healthcare Providers:  IncredibleEmployment.be  This test is no t yet approved or cleared by the [place] FDA and  has been  authorized for detection and/or diagnosis of SARS-CoV-2 by FDA under an Emergency Use Authorization (EUA). This EUA will remain  in effect (meaning this test can be used) for the duration of the COVID-19 declaration under Section 564(b)(1) of the Act, 21 U.S.C.section 360bbb-3(b)(1), unless the authorization is terminated  or revoked sooner.       Influenza A by PCR NEGATIVE NEGATIVE Final   Influenza  B by PCR NEGATIVE NEGATIVE Final    Comment: (NOTE) The Xpert Xpress SARS-CoV-2/FLU/RSV plus assay is intended as an aid in the diagnosis of influenza from Nasopharyngeal swab specimens and should not be used as a sole basis for treatment. Nasal washings and aspirates are unacceptable for Xpert Xpress SARS-CoV-2/FLU/RSV testing.  Fact Sheet for Patients: EntrepreneurPulse.com.au  Fact Sheet for Healthcare Providers: IncredibleEmployment.be  This test is not yet approved or cleared by the [place] FDA and has been authorized for detection and/or diagnosis of SARS-CoV-2 by FDA under an Emergency Use Authorization (EUA). This EUA will remain in effect (meaning this test can be used) for the duration of the COVID-19 declaration under Section 564(b)(1) of the Act, 21 U.S.C. section 360bbb-3(b)(1), unless the authorization is terminated or revoked.  Performed at [facility], [address], [place], [place]  [address]     Radiology Studies: DG Chest 2 View  Result Date: 10/18/2021 CLINICAL DATA:  Infection EXAM: CHEST - 2 VIEW COMPARISON:  Chest x-ray dated October 16, 2021 FINDINGS: Cardiac and mediastinal contours are unchanged and within normal limits. Trace bilateral pleural effusions. Background emphysema. Mild reticular opacities at the right greater than left lung bases, possibly due to scarring. No focal consolidation. No evidence of pneumothorax. IMPRESSION: Trace bilateral pleural effusions.  No focal consolidation.  Electronically Signed   By: Yetta Glassman M.D.   On: 10/18/2021 15:50   DG Foot Complete Right  Result Date: 10/18/2021 CLINICAL DATA:  Right foot gangrene. EXAM: RIGHT FOOT COMPLETE - 3+ VIEW COMPARISON:  December 22, 2020. FINDINGS: Status post surgical amputation of the second and third toes. No fracture or dislocation is noted. Abnormal soft tissue gas is seen around the fourth distal phalanx concerning for cellulitis. No definite lytic destruction is seen to suggest osteomyelitis. IMPRESSION: Postsurgical changes as described above. Abnormal soft tissue gas seen surrounding the fourth distal phalanx concerning for cellulitis. No definite lytic destruction is seen to suggest osteomyelitis. Electronically Signed   By: Marijo Conception M.D.   On: 10/18/2021 15:51       Kemiya Batdorf T. [place]  If 7PM-7AM, please contact night-coverage [email or web address] 10/19/2021, 3:30 PM

## 2021-10-19 NOTE — Consult Note (Signed)
[facility] Vascular Consult Note  MRN : [id number]  [name] is a 75 y.o. (December 28, 1945) female who presents with chief complaint of  Chief Complaint  Patient presents with   Wound Infection   History of Present Illness:  [name] is a 75 y.o. female who presents with a nonhealing ulceration to the distal aspect of the right fourth toe.  Patient was seen in Dr. Alvera Singh clinic yesterday and was noted to have signs consistent with potential gangrene of the distal aspect of the right fourth toe.  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 Patient was instructed to go to the emergency room for admission to the hospital for vascular work-up and for potential debridement / amputation of right fourth toe.  Patient presents today resting in bed comfortably although complaining of pain to multiple areas of her body.  Patient does have a history of chronic pain and does see pain management on an outpatient basis.  Patient notes the area of most concern is the fourth and fifth toes which are painful for her but also is complaining of pain in her shoulders and back.  Vascular surgery was consulted by Dr. Luana Shu in the setting of known atherosclerotic disease with ulceration to the right lower extremity.  Current Facility-Administered Medications  Medication Dose Route Frequency Provider Last Rate Last Admin   acetaminophen (TYLENOL) tablet 650 mg  650 mg Oral Q6H PRN Para Skeans, MD       Or   acetaminophen (TYLENOL) suppository 650 mg  650 mg Rectal Q6H PRN Para Skeans, MD       albuterol (PROVENTIL) (2.5 MG/3ML) 0.083% nebulizer solution 2.5 mg  2.5 mg Nebulization Q4H PRN Para Skeans, MD       ALPRAZolam Duanne Moron) tablet 0.5 mg  0.5 mg Oral Q6H PRN Para Skeans, MD   0.5 mg at 10/19/21 1010   aspirin EC tablet 81 mg  81 mg Oral Daily Para Skeans, MD   81 mg at 10/19/21 0956    atorvastatin (LIPITOR) tablet 10 mg  10 mg Oral Daily Para Skeans, MD   10 mg at 10/19/21 0955   bisacodyl (DULCOLAX) EC tablet 5 mg  5 mg Oral Daily PRN Para Skeans, MD       buPROPion [facility] SR) 12 hr tablet 150 mg  150 mg Oral Daily Florina Ou V, MD   150 mg at 10/19/21 0955   docusate sodium (COLACE) capsule 100 mg  100 mg Oral BID Para Skeans, MD   100 mg at 10/19/21 0955   escitalopram (LEXAPRO) tablet 10 mg  10 mg Oral Q1500 Para Skeans, MD       hydrALAZINE (APRESOLINE) injection 5 mg  5 mg Intravenous Q4H PRN Para Skeans, MD       HYDROmorphone (DILAUDID) tablet 1 mg  1 mg Oral Q6H PRN Para Skeans, MD       ipratropium-albuterol (DUONEB) 0.5-2.5 (3) MG/3ML nebulizer solution 3 mL  3 mL Nebulization QID Renda Rolls, RPH   3 mL at 10/19/21 5188   lactated ringers infusion   Intravenous Continuous Para Skeans, MD 75 mL/hr at 10/19/21 0322 New Bag at 10/19/21 0322   levothyroxine (SYNTHROID) tablet 88 mcg  88 mcg Oral Q0600 Para Skeans, MD   88 mcg at 10/19/21 0608   mometasone-formoterol (DULERA) 200-5 MCG/ACT  inhaler 2 puff  2 puff Inhalation BID Para Skeans, MD       montelukast (SINGULAIR) tablet 10 mg  10 mg Oral q1800 Para Skeans, MD       morphine 2 MG/ML injection 2 mg  2 mg Intravenous Q2H PRN Para Skeans, MD   2 mg at 10/19/21 7893   nicotine (NICODERM CQ - dosed in mg/24 hours) patch 14 mg  14 mg Transdermal Daily PRN Para Skeans, MD       ondansetron [facility]) tablet 4 mg  4 mg Oral Q6H PRN Para Skeans, MD       Or   ondansetron [facility]) injection 4 mg  4 mg Intravenous Q6H PRN Para Skeans, MD       oxyCODONE (Oxy IR/ROXICODONE) immediate release tablet 5 mg  5 mg Oral Q4H PRN Para Skeans, MD   5 mg at 10/19/21 0654   piperacillin-tazobactam (ZOSYN) IVPB 3.375 g  3.375 g Intravenous Q8H Renda Rolls, RPH 12.5 mL/hr at 10/19/21 1011 3.375 g at 10/19/21 1011   polyethylene glycol (MIRALAX / GLYCOLAX) packet 17 g  17 g Oral Daily PRN Para Skeans, MD       potassium chloride (KLOR-CON) packet 40 mEq  40 mEq Oral Q4H Gonfa, Taye T, MD       sodium chloride flush (NS) 0.9 % injection 3 mL  3 mL Intravenous Q12H Florina Ou V, MD   3 mL at 10/19/21 0958   traZODone (DESYREL) tablet 200 mg  200 mg Oral QHS Para Skeans, MD       vancomycin (VANCOREADY) IVPB 750 mg/150 mL  750 mg Intravenous Q24H Renda Rolls, RPH       Current Outpatient Medications  Medication Sig Dispense Refill   ALPRAZolam (XANAX) 1 MG tablet Take 1 mg by mouth 3 (three) times daily as needed.     amLODipine (NORVASC) 5 MG tablet Take 5 mg by mouth daily.     aspirin EC 81 MG tablet Take 1 tablet (81 mg total) by mouth daily. 150 tablet 2   buPROPion (WELLBUTRIN SR) 150 MG 12 hr tablet Take 150 mg by mouth daily.      clopidogrel (PLAVIX) 75 MG tablet Take 75 mg by mouth daily.     COMBIVENT RESPIMAT 20-100 MCG/ACT AERS respimat Inhale 1-2 puffs into the lungs 4 (four) times daily. 4 g 1   escitalopram (LEXAPRO) 10 MG tablet Take 10 mg by mouth daily in the afternoon.     Fluticasone-Salmeterol (ADVAIR DISKUS) 250-50 MCG/DOSE AEPB Inhale 1 puff into the lungs 2 (two) times daily. 60 each 0   levothyroxine (SYNTHROID, LEVOTHROID) 88 MCG tablet Take 88 mcg by mouth daily before breakfast.      montelukast (SINGULAIR) 10 MG tablet Take 10 mg by mouth daily.     oxyCODONE (OXY IR/ROXICODONE) 5 MG immediate release tablet Take 1 tablet (5 mg total) by mouth 2 (two) times daily as needed for severe pain. Must last 30 days 60 tablet 0   predniSONE (DELTASONE) 50 MG tablet Take 1 tablet (50 mg total) by mouth daily with breakfast for 3 days. 3 tablet 0   traZODone (DESYREL) 150 MG tablet Take 150-300 mg by mouth at bedtime as needed.     albuterol (PROVENTIL) (2.5 MG/3ML) 0.083% nebulizer solution Take 3 mLs (2.5 mg total) by nebulization every 4 (four) hours as needed for wheezing or shortness of breath. 75 mL 2  albuterol (VENTOLIN HFA) 108 (90 Base) MCG/ACT  inhaler Inhale 2 puffs into the lungs every 6 (six) hours as needed for wheezing or shortness of breath. 8 g 2   ALPRAZolam (XANAX) 0.5 MG tablet Take 1 tablet (0.5 mg total) by mouth every 6 (six) hours as needed for anxiety. (Patient not taking: Reported on 10/19/2021) 12 tablet 0   apixaban (ELIQUIS) 5 MG TABS tablet Take 1 tablet (5 mg total) by mouth 2 (two) times daily. 60 tablet 1   atorvastatin (LIPITOR) 10 MG tablet Take 1 tablet (10 mg total) by mouth daily. 30 tablet 11   tizanidine (ZANAFLEX) 2 MG capsule Take 1 capsule (2 mg total) by mouth 3 (three) times daily as needed for muscle spasms. 90 capsule 2   Past Medical History:  Diagnosis Date   Anxiety    CAD (coronary artery disease)    Carpal tunnel syndrome    Cataract    Complication of anesthesia    has woken up at times   COPD (chronic obstructive pulmonary disease) (HCC)    CRP elevated 09/14/2015   Depression    Difficult intubation    Dyslipidemia    Dyspnea    DOE   Dysrhythmia    hx palpatations   Elevated sedimentation rate 09/14/2015   Esophageal spasm    Gastrointestinal parasites    GERD (gastroesophageal reflux disease)    Hiatal hernia    History of peptic ulcer disease    Hypertension    Hyperthyroidism    Hypothyroidism    Low magnesium levels 09/14/2015   Pelvic fracture ([place]) 2008   fall from riding a horse   Peripheral vascular disease (HCC)    Reflux    Rotator cuff injury    Stenosis, spinal, lumbar    Past Surgical History:  Procedure Laterality Date   AMPUTATION TOE Right 12/26/2020   Procedure: AMPUTATION TOE-3rd Toes;  Surgeon: Samara Deist, DPM;  Location: [facility];  Service: Podiatry;  Laterality: Right;   AMPUTATION TOE Right 04/22/2021   Procedure: AMPUTATION TOE- RIGHT 2ND;  Surgeon: Samara Deist, DPM;  Location: [facility];  Service: Podiatry;  Laterality: Right;   APPENDECTOMY     BACK SURGERY     CERVICAL FUSION   CARPAL TUNNEL RELEASE     CATARACT EXTRACTION W/PHACO  Right 08/07/2017   Procedure: CATARACT EXTRACTION PHACO AND INTRAOCULAR LENS PLACEMENT ([place]);  Surgeon: Birder Robson, MD;  Location: [facility];  Service: Ophthalmology;  Laterality: Right;  [place] 00:52.0 AP% 16.8 CDE 8.74 Fluid Pack Lot # [id number]   CATARACT EXTRACTION W/PHACO Left 09/04/2017   Procedure: CATARACT EXTRACTION PHACO AND INTRAOCULAR LENS PLACEMENT ([place]);  Surgeon: Birder Robson, MD;  Location: [facility];  Service: Ophthalmology;  Laterality: Left;  [place] 00:34 AP% 17.0 CDE 5.80 Fluid pack lot # [id number]   CESAREAN SECTION     CHOLECYSTECTOMY     FRACTURE SURGERY     HIP SURGERY     INTRAMEDULLARY (IM) NAIL INTERTROCHANTERIC Right 10/01/2016   Procedure: INTRAMEDULLARY (IM) NAIL INTERTROCHANTRIC;  Surgeon: Corky Mull, MD;  Location: [facility];  Service: Orthopedics;  Laterality: Right;   INTRAMEDULLARY (IM) NAIL INTERTROCHANTERIC Left 01/14/2021   Procedure: INTRAMEDULLARY (IM) NAIL INTERTROCHANTRIC;  Surgeon: Lovell Sheehan, MD;  Location: [facility];  Service: Orthopedics;  Laterality: Left;   KYPHOPLASTY N/A 08/20/2018   Procedure: [id number];  Surgeon: Hessie Knows, MD;  Location: [facility];  Service: Orthopedics;  Laterality: N/A;   LOWER EXTREMITY ANGIOGRAPHY Right 12/18/2019  Procedure: LOWER EXTREMITY ANGIOGRAPHY;  Surgeon: Algernon Huxley, MD;  Location: [place] [facility];  Service: Cardiovascular;  Laterality: Right;   LOWER EXTREMITY ANGIOGRAPHY Right 12/02/2020   Procedure: LOWER EXTREMITY ANGIOGRAPHY;  Surgeon: Algernon Huxley, MD;  Location: [place] [facility];  Service: Cardiovascular;  Laterality: Right;   LOWER EXTREMITY ANGIOGRAPHY Right 12/23/2020   Procedure: Lower Extremity Angiography;  Surgeon: Algernon Huxley, MD;  Location: [place] [facility];  Service: Cardiovascular;  Laterality: Right;   LOWER EXTREMITY ANGIOGRAPHY Right 12/24/2020   Procedure: Lower Extremity Angiography;  Surgeon: Algernon Huxley, MD;  Location: [place] [facility];  Service:  Cardiovascular;  Laterality: Right;   NECK SURGERY     NOSE SURGERY     ROTATOR CUFF REPAIR     x2   SHOULDER ARTHROSCOPY  12/07/2011   Procedure: ARTHROSCOPY SHOULDER;  Surgeon: Ninetta Lights, MD;  Location: [place];  Service: Orthopedics;  Laterality: Right;  Debridement Partial Cuff Tear, Release Coracoacromial Ligament   SHOULDER SURGERY  12/07/2011   right   Social History Social History   Tobacco Use   Smoking status: Some Days    Packs/day: 0.00    Types: Cigarettes   Smokeless tobacco: Never   Tobacco comments:    pt states 2 cigs per month   Substance Use Topics   Alcohol use: No   Drug use: No   Family History Family History  Problem Relation Age of Onset   Aneurysm Other   Denies family history of peripheral artery disease, venous disease or renal disease.  No Known Allergies  REVIEW OF SYSTEMS (Negative unless checked)  Constitutional: [] Weight loss  [] Fever  [] Chills Cardiac: [] Chest pain   [] Chest pressure   [] Palpitations   [] Shortness of breath when laying flat   [] Shortness of breath at rest   [] Shortness of breath with exertion. Vascular:  [] Pain in legs with walking   [] Pain in legs at rest   [] Pain in legs when laying flat   [x] Claudication   [] Pain in feet when walking  [] Pain in feet at rest  [] Pain in feet when laying flat   [] History of DVT   [] Phlebitis   [] Swelling in legs   [] Varicose veins   [] Non-healing ulcers Pulmonary:   [] Uses home oxygen   [] Productive cough   [] Hemoptysis   [] Wheeze  [] COPD   [] Asthma Neurologic:  [] Dizziness  [] Blackouts   [] Seizures   [] History of stroke   [] History of TIA  [] Aphasia   [] Temporary blindness   [] Dysphagia   [] Weakness or numbness in arms   [] Weakness or numbness in legs Musculoskeletal:  [] Arthritis   [] Joint swelling   [] Joint pain   [] Low back pain Hematologic:  [] Easy bruising  [] Easy bleeding   [] Hypercoagulable state   [] Anemic  [] Hepatitis Gastrointestinal:  [] Blood in stool    [] Vomiting blood  [] Gastroesophageal reflux/heartburn   [] Difficulty swallowing. Genitourinary:  [] Chronic kidney disease   [] Difficult urination  [] Frequent urination  [] Burning with urination   [] Blood in urine Skin:  [] Rashes   [x] Ulcers   [x] Wounds Psychological:  [] History of anxiety   []  History of major depression.  Physical Examination  Vitals:   10/19/21 1100 10/19/21 1200 10/19/21 1300 10/19/21 1400  BP: (!) 152/61 (!) 142/96  (!) 136/105  Pulse: 99 98 (!) 113 (!) 129  Resp: 18 (!) 25 (!) 22 15  Temp:      TempSrc:      SpO2: 96% 98% 97%  Weight:      Height:       Body mass index is 18.92 kg/m. Gen:  WD/WN, NAD Head: [place]/AT, No temporalis wasting. Prominent temp pulse not noted. Ear/Nose/Throat: Hearing grossly intact, nares w/o erythema or drainage, oropharynx w/o Erythema/Exudate Eyes: Sclera non-icteric, conjunctiva clear Neck: Trachea midline.  No JVD.  Pulmonary:  Good air movement, respirations not labored, equal bilaterally.  Cardiac: RRR, normal S1, S2. Vascular:  Vessel Right Left  Radial Palpable Palpable  Ulnar Palpable Palpable  Brachial Palpable Palpable  Carotid Palpable, without bruit Palpable, without bruit  Aorta Not palpable N/A  Femoral Palpable Palpable  Popliteal Palpable Palpable  PT Non-Palpable Non-Palpable  DP Non-Palpable Non-Palpable   Right lower extremity: Thigh soft.  Calf soft.  Wound noted to the fourth toe.  Amputation of toes 2-3 healed.  Hard to palpate pedal pulses.  Motor/sensory is intact.  Gastrointestinal: soft, non-tender/non-distended. No guarding/reflex.  Musculoskeletal: M/S 5/5 throughout.  Extremities without ischemic changes.  No deformity or atrophy. No edema. Neurologic: Sensation grossly intact in extremities.  Symmetrical.  Speech is fluent. Motor exam as listed above. Psychiatric: Judgment intact, Mood & affect appropriate for pt's clinical situation. Dermatologic: No rashes or ulcers noted.  No cellulitis or  open wounds. Lymph : No Cervical, Axillary, or Inguinal lymphadenopathy.  CBC Lab Results  Component Value Date   WBC 8.7 10/19/2021   HGB 11.9 (L) 10/19/2021   HCT 37.4 10/19/2021   MCV 87.8 10/19/2021   PLT 342 10/19/2021   BMET    Component Value Date/Time   NA 136 10/19/2021 0626   NA 143 10/06/2019 1552   NA 138 02/10/2014 1335   K 3.4 (L) 10/19/2021 0626   K 4.5 02/10/2014 1335   CL 105 10/19/2021 0626   CL 106 02/10/2014 1335   CO2 23 10/19/2021 0626   CO2 28 02/10/2014 1335   GLUCOSE 85 10/19/2021 0626   GLUCOSE 99 02/10/2014 1335   BUN 11 10/19/2021 0626   BUN 7 (L) 10/06/2019 1552   BUN 6 (L) 02/10/2014 1335   CREATININE 0.61 10/19/2021 0626   CREATININE 0.89 02/10/2014 1335   CALCIUM 8.8 (L) 10/19/2021 0626   CALCIUM 8.7 02/10/2014 1335   GFRNONAA >60 10/19/2021 0626   GFRNONAA >60 02/10/2014 1335   GFRAA >60 12/18/2019 1012   GFRAA >60 02/10/2014 1335   Estimated Creatinine Clearance: 48.7 mL/min (by C-G formula based on SCr of 0.61 mg/dL).  COAG Lab Results  Component Value Date   INR 1.2 01/14/2021   INR 1.1 01/13/2021   INR 1.2 01/11/2021   Radiology DG Chest 2 View  Result Date: 10/18/2021 CLINICAL DATA:  Infection EXAM: CHEST - 2 VIEW COMPARISON:  Chest x-ray dated October 16, 2021 FINDINGS: Cardiac and mediastinal contours are unchanged and within normal limits. Trace bilateral pleural effusions. Background emphysema. Mild reticular opacities at the right greater than left lung bases, possibly due to scarring. No focal consolidation. No evidence of pneumothorax. IMPRESSION: Trace bilateral pleural effusions.  No focal consolidation. Electronically Signed   By: Yetta Glassman M.D.   On: 10/18/2021 15:50   DG Chest 2 View  Result Date: 10/16/2021 CLINICAL DATA:  75 year old female with history of chest pain and shortness of breath. EXAM: CHEST - 2 VIEW COMPARISON:  Chest x-ray 03/21/2021. FINDINGS: Lung volumes are increased with advanced  emphysematous changes. No consolidative airspace disease. No pleural effusions. No pneumothorax. No pulmonary nodule or mass noted. Pulmonary vasculature and the cardiomediastinal silhouette are within normal  limits. Atherosclerosis in the thoracic aorta. Orthopedic fixation hardware in the lower cervical spine incidentally noted. IMPRESSION: 1.  No radiographic evidence of acute cardiopulmonary disease. 2. Emphysema. 3. Aortic atherosclerosis. Electronically Signed   By: Vinnie Langton M.D.   On: 10/16/2021 06:27   DG Foot Complete Right  Result Date: 10/18/2021 CLINICAL DATA:  Right foot gangrene. EXAM: RIGHT FOOT COMPLETE - 3+ VIEW COMPARISON:  December 22, 2020. FINDINGS: Status post surgical amputation of the second and third toes. No fracture or dislocation is noted. Abnormal soft tissue gas is seen around the fourth distal phalanx concerning for cellulitis. No definite lytic destruction is seen to suggest osteomyelitis. IMPRESSION: Postsurgical changes as described above. Abnormal soft tissue gas seen surrounding the fourth distal phalanx concerning for cellulitis. No definite lytic destruction is seen to suggest osteomyelitis. Electronically Signed   By: Marijo Conception M.D.   On: 10/18/2021 15:51    Assessment/Plan [name] is a 75 y.o. female who presents with a nonhealing ulceration to the distal aspect of the right fourth toe.  1.  Atherosclerotic disease with ulceration to the right lower extremity: Patient with known history of atherosclerotic disease with ulcerations to right lower extremity status post toes 2-3 amputated as well as multiple endovascular interventions.  Patient presents with nonhealing ulceration to the fourth toe.  Recommend undergoing a repeat right lower extremity angiogram with possible intervention and attempt to assess the patient's anatomy and contributing degree of atherosclerotic disease.  If appropriate an attempt to revascularize leg can be at that time.   Procedure, risks and benefits were explained to the patient.  All questions were answered.  The patient wishes to proceed.  We will plan on this tomorrow with Dr. Lucky Cowboy  2.  Hyperlipidemia: On aspirin and statin for medical management Encouraged good control as its slows the progression of atherosclerotic disease   3.  Nicotine dependence: We had a discussion for approximately three minutes regarding the absolute need for smoking cessation due to the deleterious nature of tobacco on the vascular system. We discussed the tobacco use would diminish patency of any intervention, and likely significantly worsen progression of disease. The patient voices their understanding of the importance of smoking cessation.   Discussed with Dr. Mayme Genta, PA-C 10/19/2021 2:32 PM  This note was created with Dragon medical transcription system.  Any error is purely unintentional.

## 2021-10-19 NOTE — Progress Notes (Signed)
Patient monitor alarming for sats. Patient asleep. Breathing regular and unlabored. 2L [place] applied. Sats improved to 94%. Patient arousable and agreeable to oxygen.

## 2021-10-19 NOTE — Consult Note (Signed)
PODIATRY / FOOT AND ANKLE SURGERY CONSULTATION NOTE  Requesting Physician: Dr. Cyndia Skeeters  Reason for consult: Right fourth toe infection  Chief Complaint: Right fourth toe pain/infection   HPI: [name] is a 75 y.o. female who presents with a nonhealing ulceration to the distal aspect of the right fourth toe.  Patient was seen in Dr. Alvera Singh clinic yesterday and was noted to have signs consistent with potential gangrene of the distal aspect of the right fourth toe.  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  Patient was instructed to go to the emergency room for admission to the hospital for vascular work-up and for potential debridement/amputation of right fourth toe.  Patient presents today resting in bed comfortably although complaining of pain to multiple areas of her body.  Patient does have a history of chronic pain and does see pain management on an outpatient basis.  Patient notes the area of most concern is the fourth and fifth toes which are painful for her but also is complaining of pain in her shoulders and back.  PMHx:  Past Medical History:  Diagnosis Date   Anxiety    CAD (coronary artery disease)    Carpal tunnel syndrome    Cataract    Complication of anesthesia    has woken up at times   COPD (chronic obstructive pulmonary disease) (HCC)    CRP elevated 09/14/2015   Depression    Difficult intubation    Dyslipidemia    Dyspnea    DOE   Dysrhythmia    hx palpatations   Elevated sedimentation rate 09/14/2015   Esophageal spasm    Gastrointestinal parasites    GERD (gastroesophageal reflux disease)    Hiatal hernia    History of peptic ulcer disease    Hypertension    Hyperthyroidism    Hypothyroidism    Low magnesium levels 09/14/2015   Pelvic fracture ([place]) 2008   fall from riding a horse   Peripheral vascular disease (HCC)    Reflux    Rotator cuff injury    Stenosis,  spinal, lumbar     Surgical Hx:  Past Surgical History:  Procedure Laterality Date   AMPUTATION TOE Right 12/26/2020   Procedure: AMPUTATION TOE-3rd Toes;  Surgeon: Samara Deist, DPM;  Location: [facility];  Service: Podiatry;  Laterality: Right;   AMPUTATION TOE Right 04/22/2021   Procedure: AMPUTATION TOE- RIGHT 2ND;  Surgeon: Samara Deist, DPM;  Location: [facility];  Service: Podiatry;  Laterality: Right;   APPENDECTOMY     BACK SURGERY     CERVICAL FUSION   CARPAL TUNNEL RELEASE     CATARACT EXTRACTION W/PHACO Right 08/07/2017   Procedure: CATARACT EXTRACTION PHACO AND INTRAOCULAR LENS PLACEMENT ([place]);  Surgeon: Birder Robson, MD;  Location: [facility];  Service: Ophthalmology;  Laterality: Right;  [place] 00:52.0 AP% 16.8 CDE 8.74 Fluid Pack Lot # [id number]   CATARACT EXTRACTION W/PHACO Left 09/04/2017   Procedure: CATARACT EXTRACTION PHACO AND INTRAOCULAR LENS PLACEMENT ([place]);  Surgeon: Birder Robson, MD;  Location: [facility];  Service: Ophthalmology;  Laterality: Left;  [place] 00:34 AP% 17.0 CDE 5.80 Fluid pack lot # [id number]   CESAREAN SECTION     CHOLECYSTECTOMY     FRACTURE SURGERY     HIP SURGERY     INTRAMEDULLARY (IM) NAIL INTERTROCHANTERIC Right 10/01/2016   Procedure: INTRAMEDULLARY (IM) NAIL INTERTROCHANTRIC;  Surgeon: Corky Mull, MD;  Location: [facility];  Service:  Orthopedics;  Laterality: Right;   INTRAMEDULLARY (IM) NAIL INTERTROCHANTERIC Left 01/14/2021   Procedure: INTRAMEDULLARY (IM) NAIL INTERTROCHANTRIC;  Surgeon: Lovell Sheehan, MD;  Location: [facility];  Service: Orthopedics;  Laterality: Left;   KYPHOPLASTY N/A 08/20/2018   Procedure: [id number];  Surgeon: Hessie Knows, MD;  Location: [facility];  Service: Orthopedics;  Laterality: N/A;   LOWER EXTREMITY ANGIOGRAPHY Right 12/18/2019   Procedure: LOWER EXTREMITY ANGIOGRAPHY;  Surgeon: Algernon Huxley, MD;  Location: [facility];  Service: Cardiovascular;  Laterality: Right;   LOWER EXTREMITY ANGIOGRAPHY  Right 12/02/2020   Procedure: LOWER EXTREMITY ANGIOGRAPHY;  Surgeon: Algernon Huxley, MD;  Location: [facility];  Service: Cardiovascular;  Laterality: Right;   LOWER EXTREMITY ANGIOGRAPHY Right 12/23/2020   Procedure: Lower Extremity Angiography;  Surgeon: Algernon Huxley, MD;  Location: [facility];  Service: Cardiovascular;  Laterality: Right;   LOWER EXTREMITY ANGIOGRAPHY Right 12/24/2020   Procedure: Lower Extremity Angiography;  Surgeon: Algernon Huxley, MD;  Location: [facility];  Service: Cardiovascular;  Laterality: Right;   NECK SURGERY     NOSE SURGERY     ROTATOR CUFF REPAIR     x2   SHOULDER ARTHROSCOPY  12/07/2011   Procedure: ARTHROSCOPY SHOULDER;  Surgeon: Ninetta Lights, MD;  Location: [facility];  Service: Orthopedics;  Laterality: Right;  Debridement Partial Cuff Tear, Release Coracoacromial Ligament   SHOULDER SURGERY  12/07/2011   right    FHx:  Family History  Problem Relation Age of Onset   Aneurysm Other     Social History:  reports that she has been smoking cigarettes. She has never used smokeless tobacco. She reports that she does not drink alcohol and does not use drugs.  Allergies: No Known Allergies  Physical Exam: General: Alert and oriented.  No apparent distress.  Vascular: DP/PT pulses nonpalpable bilateral.  Both feet are fairly warm to touch left greater than right.  No hair growth noted to digits.  Capillary fill time appears to be decreased or nearly absent to the distal aspect of the right fourth toe.  Neuro: Light touch sensation intact to bilateral lower extremities, patient does appear to be somewhat hypersensitive with palpation.  Derm: Ulceration to the distal aspect of the right fourth toe with gangrenous changes to the distal tip, appears to be grossly contaminated with hair likely from an animal to the area, no obvious bone exposed, mild increase in erythema, minimal edema, no obvious drainage but odor is  noted.  Skin appears to be thin and atrophic to both feet.  Nails x8 appear to be thickened, elongated, dystrophic and brittle subungual debris.  MSK: Right second and third toe amputations.  Pain on palpation of the right forefoot.  Results for orders placed or performed during the hospital encounter of 10/18/21 (from the past 48 hour(s))  Lactic acid, plasma     Status: None   Collection Time: 10/18/21  3:08 PM  Result Value Ref Range   Lactic Acid, Venous 1.5 0.5 - 1.9 mmol/L    Comment: Performed at [facility], [address] [place], [place] [address]  Comprehensive metabolic panel     Status: Abnormal   Collection Time: 10/18/21  3:08 PM  Result Value Ref Range   Sodium 135 135 - 145 mmol/L   Potassium 3.7 3.5 - 5.1 mmol/L   Chloride 99 98 - 111 mmol/L   CO2 26 22 - 32 mmol/L   Glucose, Bld 103 (  H) 70 - 99 mg/dL    Comment: Glucose reference range applies only to samples taken after fasting for at least 8 hours.   BUN 12 8 - 23 mg/dL   Creatinine, Ser 0.61 0.44 - 1.00 mg/dL   Calcium 9.1 8.9 - 10.3 mg/dL   Total Protein 7.2 6.5 - 8.1 g/dL   Albumin 3.8 3.5 - 5.0 g/dL   AST 27 15 - 41 U/L   ALT 36 0 - 44 U/L   Alkaline Phosphatase 109 38 - 126 U/L   Total Bilirubin 0.7 0.3 - 1.2 mg/dL   GFR, Estimated >60 >60 mL/min    Comment: (NOTE) Calculated using the CKD-EPI Creatinine Equation (2021)    Anion gap 10 5 - 15    Comment: Performed at [facility], [address] [place], [place] [address]  CBC with Differential     Status: Abnormal   Collection Time: 10/18/21  3:08 PM  Result Value Ref Range   WBC 13.6 (H) 4.0 - 10.5 K/uL   RBC 4.29 3.87 - 5.11 MIL/uL   Hemoglobin 12.3 12.0 - 15.0 g/dL   HCT 38.0 36.0 - 46.0 %   MCV 88.6 80.0 - 100.0 fL   MCH 28.7 26.0 - 34.0 pg   MCHC 32.4 30.0 - 36.0 g/dL   RDW 15.6 (H) 11.5 - 15.5 %   Platelets 477 (H) 150 - 400 K/uL   nRBC 0.0 0.0 - 0.2 %   Neutrophils Relative % 71 %   Neutro Abs 9.8 (H) 1.7 - 7.7  K/uL   Lymphocytes Relative 18 %   Lymphs Abs 2.4 0.7 - 4.0 K/uL   Monocytes Relative 8 %   Monocytes Absolute 1.1 (H) 0.1 - 1.0 K/uL   Eosinophils Relative 1 %   Eosinophils Absolute 0.1 0.0 - 0.5 K/uL   Basophils Relative 1 %   Basophils Absolute 0.1 0.0 - 0.1 K/uL   Immature Granulocytes 1 %   Abs Immature Granulocytes 0.08 (H) 0.00 - 0.07 K/uL    Comment: Performed at [facility], [address] [place], [place] [address]  Blood culture (routine x 2)     Status: None (Preliminary result)   Collection Time: 10/18/21 10:42 PM   Specimen: BLOOD  Result Value Ref Range   Specimen Description BLOOD RIGHT ASSIST CONTROL    Special Requests      BOTTLES DRAWN AEROBIC AND ANAEROBIC Blood Culture adequate volume   Culture      NO GROWTH < 12 HOURS Performed at [facility], [address] [place], [place] [address]    Report Status PENDING   Blood culture (routine x 2)     Status: None (Preliminary result)   Collection Time: 10/18/21 10:47 PM   Specimen: BLOOD  Result Value Ref Range   Specimen Description BLOOD RIGHT FOREARM    Special Requests      BOTTLES DRAWN AEROBIC AND ANAEROBIC Blood Culture adequate volume   Culture      NO GROWTH < 12 HOURS Performed at [facility], [address] [place], [place] [address]    Report Status PENDING   Resp Panel by RT-PCR (Flu A&B, Covid) Nasopharyngeal Swab     Status: None   Collection Time: 10/18/21 10:57 PM   Specimen: Nasopharyngeal Swab; Nasopharyngeal(NP) swabs in vial transport medium  Result Value Ref Range   SARS Coronavirus 2 by RT PCR NEGATIVE NEGATIVE    Comment: (NOTE) SARS-CoV-2 target nucleic acids are NOT DETECTED.  The SARS-CoV-2 RNA is generally detectable in upper respiratory specimens during the acute phase of infection. The lowest concentration of SARS-CoV-2 viral copies this assay can detect is 138 copies/mL. A negative result does not preclude SARS-Cov-2 infection and  should not be used as the sole basis for treatment or other patient management decisions. A negative result may occur with  improper specimen collection/handling, submission of specimen other than nasopharyngeal swab, presence of viral mutation(s) within the areas targeted by this assay, and inadequate number of viral copies(<138 copies/mL). A negative result must be combined with clinical observations, patient history, and epidemiological information. The expected result is Negative.  Fact Sheet for Patients:  EntrepreneurPulse.com.au  Fact Sheet for Healthcare Providers:  IncredibleEmployment.be  This test is no t yet approved or cleared by the [place] FDA and  has been authorized for detection and/or diagnosis of SARS-CoV-2 by FDA under an Emergency Use Authorization (EUA). This EUA will remain  in effect (meaning this test can be used) for the duration of the COVID-19 declaration under Section 564(b)(1) of the Act, 21 U.S.C.section 360bbb-3(b)(1), unless the authorization is terminated  or revoked sooner.       Influenza A by PCR NEGATIVE NEGATIVE   Influenza B by PCR NEGATIVE NEGATIVE    Comment: (NOTE) The Xpert Xpress SARS-CoV-2/FLU/RSV plus assay is intended as an aid in the diagnosis of influenza from Nasopharyngeal swab specimens and should not be used as a sole basis for treatment. Nasal washings and aspirates are unacceptable for Xpert Xpress SARS-CoV-2/FLU/RSV testing.  Fact Sheet for Patients: EntrepreneurPulse.com.au  Fact Sheet for Healthcare Providers: IncredibleEmployment.be  This test is not yet approved or cleared by the [place] FDA and has been authorized for detection and/or diagnosis of SARS-CoV-2 by FDA under an Emergency Use Authorization (EUA). This EUA will remain in effect (meaning this test can be used) for the duration of the COVID-19 declaration under Section  564(b)(1) of the Act, 21 U.S.C. section 360bbb-3(b)(1), unless the authorization is terminated or revoked.  Performed at [facility], [address] [place], [place] [address]   Urinalysis, Routine w reflex microscopic     Status: Abnormal   Collection Time: 10/19/21 12:11 AM  Result Value Ref Range   Color, Urine YELLOW YELLOW    Comment: YELLOW   APPearance CLEAR CLEAR    Comment: CLEAR   Specific Gravity, Urine <1.005 (L) 1.005 - 1.030   pH 6.0 5.0 - 8.0   Glucose, UA NEGATIVE NEGATIVE mg/dL   Hgb urine dipstick NEGATIVE NEGATIVE   Bilirubin Urine NEGATIVE NEGATIVE   Ketones, ur NEGATIVE NEGATIVE mg/dL   Protein, ur NEGATIVE NEGATIVE mg/dL   Nitrite NEGATIVE NEGATIVE   Leukocytes,Ua SMALL (A) NEGATIVE    Comment: Performed at [facility], [address] [place], [place] [address]  Urinalysis, Microscopic (reflex)     Status: None   Collection Time: 10/19/21 12:11 AM  Result Value Ref Range   RBC / HPF 0-5 0 - 5 RBC/hpf   WBC, UA 0-5 0 - 5 WBC/hpf   Bacteria, UA NONE SEEN NONE SEEN   Squamous Epithelial / LPF NONE SEEN 0 - 5    Comment: Performed at [facility], [address], [place], [place] [address]  Basic metabolic panel     Status: Abnormal   Collection Time: 10/19/21  6:26 AM  Result Value Ref Range   Sodium 136 135 - 145 mmol/L   Potassium 3.4 (L) 3.5 - 5.1 mmol/L  Chloride 105 98 - 111 mmol/L   CO2 23 22 - 32 mmol/L   Glucose, Bld 85 70 - 99 mg/dL    Comment: Glucose reference range applies only to samples taken after fasting for at least 8 hours.   BUN 11 8 - 23 mg/dL   Creatinine, Ser 0.61 0.44 - 1.00 mg/dL   Calcium 8.8 (L) 8.9 - 10.3 mg/dL   GFR, Estimated >60 >60 mL/min    Comment: (NOTE) Calculated using the CKD-EPI Creatinine Equation (2021)    Anion gap 8 5 - 15    Comment: Performed at [facility], [address] [place], [place] [address]  CBC     Status: Abnormal   Collection Time: 10/19/21  6:26  AM  Result Value Ref Range   WBC 8.7 4.0 - 10.5 K/uL   RBC 4.26 3.87 - 5.11 MIL/uL   Hemoglobin 11.9 (L) 12.0 - 15.0 g/dL   HCT 37.4 36.0 - 46.0 %   MCV 87.8 80.0 - 100.0 fL   MCH 27.9 26.0 - 34.0 pg   MCHC 31.8 30.0 - 36.0 g/dL   RDW 15.7 (H) 11.5 - 15.5 %   Platelets 342 150 - 400 K/uL   nRBC 0.0 0.0 - 0.2 %    Comment: Performed at [facility], [address] [place], [place] [address]  Lactic acid, plasma     Status: None   Collection Time: 10/19/21  6:26 AM  Result Value Ref Range   Lactic Acid, Venous 1.4 0.5 - 1.9 mmol/L    Comment: Performed at [facility], [address] [place], [place] [address]  Magnesium     Status: None   Collection Time: 10/19/21  6:26 AM  Result Value Ref Range   Magnesium 1.7 1.7 - 2.4 mg/dL    Comment: Performed at [facility], [address] [place], [place] [address]   DG Chest 2 View  Result Date: 10/18/2021 CLINICAL DATA:  Infection EXAM: CHEST - 2 VIEW COMPARISON:  Chest x-ray dated October 16, 2021 FINDINGS: Cardiac and mediastinal contours are unchanged and within normal limits. Trace bilateral pleural effusions. Background emphysema. Mild reticular opacities at the right greater than left lung bases, possibly due to scarring. No focal consolidation. No evidence of pneumothorax. IMPRESSION: Trace bilateral pleural effusions.  No focal consolidation. Electronically Signed   By: Yetta Glassman M.D.   On: 10/18/2021 15:50   DG Foot Complete Right  Result Date: 10/18/2021 CLINICAL DATA:  Right foot gangrene. EXAM: RIGHT FOOT COMPLETE - 3+ VIEW COMPARISON:  December 22, 2020. FINDINGS: Status post surgical amputation of the second and third toes. No fracture or dislocation is noted. Abnormal soft tissue gas is seen around the fourth distal phalanx concerning for cellulitis. No definite lytic destruction is seen to suggest osteomyelitis. IMPRESSION: Postsurgical changes as described above. Abnormal soft tissue  gas seen surrounding the fourth distal phalanx concerning for cellulitis. No definite lytic destruction is seen to suggest osteomyelitis. Electronically Signed   By: Marijo Conception M.D.   On: 10/18/2021 15:51    Blood pressure 132/81, pulse (!) 102, temperature 98.5 F (36.9 C), temperature source Oral, resp. rate 20, height 5\' 4"  (1.626 m), weight 50 kg, SpO2 91 %.  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 -X-ray imaging reviewed.  Does not appear to have any evidence of osteomyelitis on x-ray imaging, no obvious gas also present to the  area. -Wound appears to be very stable at this time to the right fourth toe with mild edema and minimal erythema, no drainage, wound does appear to be grossly contaminated with external foreign debris and hair, appears to have mild odor and some gangrenous changes. -Consultation placed with vascular surgery for further assessment of arterial flow.  They are planning for CT angiogram within the next 1 to 2 days. -Discussed with patient she will likely need at least a partial amputation of the right fourth toe.  Discussed also potential for transmetatarsal amputation in the future if this fails to heal or if patient requires another subsequent amputation. -Discussed with patient that her chronic pain is likely to not be resolved with amputation as patient does have history of chronic pain as well as severe peripheral vascular disease.  Patient will require further subsequent pain management postoperatively. -Recommend Betadine paint be applied to the toe daily. -Appreciate medicine recommendations for antibiotic therapy. -If vascular is able to perform procedure tomorrow could potentially perform amputation on Friday.  Caroline More, DPM 10/19/2021, 12:40 PM

## 2021-10-20 ENCOUNTER — Encounter
Admission: EM | Disposition: A | Discharge status: Home / Self Care | Payer: Self-pay | Source: Home / Self Care | Attending: Student

## 2021-10-20 ENCOUNTER — Encounter: Payer: Self-pay | Admitting: Vascular Surgery

## 2021-10-20 DIAGNOSIS — I70261 Atherosclerosis of native arteries of extremities with gangrene, right leg: Secondary | ICD-10-CM

## 2021-10-20 DIAGNOSIS — R0989 Other specified symptoms and signs involving the circulatory and respiratory systems: Secondary | ICD-10-CM | POA: Diagnosis not present

## 2021-10-20 DIAGNOSIS — L03031 Cellulitis of right toe: Secondary | ICD-10-CM | POA: Diagnosis not present

## 2021-10-20 DIAGNOSIS — F419 Anxiety disorder, unspecified: Secondary | ICD-10-CM | POA: Diagnosis not present

## 2021-10-20 DIAGNOSIS — I70223 Atherosclerosis of native arteries of extremities with rest pain, bilateral legs: Secondary | ICD-10-CM | POA: Diagnosis not present

## 2021-10-20 DIAGNOSIS — E440 Moderate protein-calorie malnutrition: Secondary | ICD-10-CM | POA: Insufficient documentation

## 2021-10-20 HISTORY — PX: LOWER EXTREMITY ANGIOGRAPHY: CATH118251

## 2021-10-20 LAB — BASIC METABOLIC PANEL
Anion gap: 3 — ABNORMAL LOW (ref 5–15)
BUN: 14 mg/dL (ref 8–23)
CO2: 27 mmol/L (ref 22–32)
Calcium: 8.2 mg/dL — ABNORMAL LOW (ref 8.9–10.3)
Chloride: 109 mmol/L (ref 98–111)
Creatinine, Ser: 0.52 mg/dL (ref 0.44–1.00)
GFR, Estimated: >60 mL/min (ref >60)
Glucose, Bld: 100 mg/dL — ABNORMAL HIGH (ref 70–99)
Potassium: 3.7 mmol/L (ref 3.5–5.1)
Sodium: 139 mmol/L (ref 135–145)

## 2021-10-20 SURGERY — LOWER EXTREMITY ANGIOGRAPHY
Anesthesia: Moderate Sedation | Laterality: Right

## 2021-10-20 MED ORDER — MIDAZOLAM HCL 2 MG/ML PO SYRP
8.0000 mg | ORAL_SOLUTION | Freq: Once | ORAL | Status: DC | PRN
  Filled 2021-10-20: qty 4

## 2021-10-20 MED ORDER — CHLORHEXIDINE GLUCONATE 4 % EX LIQD: 60.0000 mL | Freq: Once | CUTANEOUS | Status: DC

## 2021-10-20 MED ORDER — FENTANYL CITRATE PF 50 MCG/ML IJ SOSY
PREFILLED_SYRINGE | INTRAMUSCULAR | Status: AC
  Filled 2021-10-20: qty 1

## 2021-10-20 MED ORDER — CEFAZOLIN SODIUM-DEXTROSE 2-4 GM/100ML-% IV SOLN: 2.0000 g | INTRAVENOUS | Status: DC

## 2021-10-20 MED ORDER — ONDANSETRON HCL 4 MG/2ML IJ SOLN: 4.0000 mg | Freq: Four times a day (QID) | INTRAMUSCULAR | Status: DC | PRN

## 2021-10-20 MED ORDER — CLOPIDOGREL BISULFATE 75 MG PO TABS
75.0000 mg | ORAL_TABLET | Freq: Every day | ORAL | Status: DC
  Administered 2021-10-20 – 2021-10-23 (×3): 75 mg via ORAL
  Filled 2021-10-20 (×4): qty 1

## 2021-10-20 MED ORDER — DIPHENHYDRAMINE HCL 50 MG/ML IJ SOLN: 50.0000 mg | Freq: Once | INTRAMUSCULAR | Status: DC | PRN

## 2021-10-20 MED ORDER — FAMOTIDINE 20 MG PO TABS: 40.0000 mg | ORAL_TABLET | Freq: Once | ORAL | Status: DC | PRN

## 2021-10-20 MED ORDER — HYDROMORPHONE HCL 1 MG/ML IJ SOLN
1.0000 mg | Freq: Once | INTRAMUSCULAR | Status: AC | PRN
  Administered 2021-10-20: 1 mg via INTRAVENOUS
  Filled 2021-10-20: qty 1

## 2021-10-20 MED ORDER — POVIDONE-IODINE 10 % EX SWAB: 2.0000 "application " | Freq: Once | CUTANEOUS | Status: DC

## 2021-10-20 MED ORDER — JUVEN PO PACK
1.0000 | PACK | Freq: Two times a day (BID) | ORAL | Status: DC
  Administered 2021-10-22 – 2021-10-23 (×2): 1 via ORAL

## 2021-10-20 MED ORDER — MIDAZOLAM HCL 2 MG/2ML IJ SOLN
INTRAMUSCULAR | Status: DC | PRN
  Administered 2021-10-20: 2 mg via INTRAVENOUS
  Administered 2021-10-20 (×3): 1 mg via INTRAVENOUS

## 2021-10-20 MED ORDER — METHYLPREDNISOLONE SODIUM SUCC 125 MG IJ SOLR: 125.0000 mg | Freq: Once | INTRAMUSCULAR | Status: DC | PRN

## 2021-10-20 MED ORDER — NITROGLYCERIN 1 MG/10 ML FOR IR/CATH LAB
INTRA_ARTERIAL | Status: DC | PRN
  Administered 2021-10-20: 300 ug via INTRA_ARTERIAL

## 2021-10-20 MED ORDER — MIDAZOLAM HCL 2 MG/2ML IJ SOLN
INTRAMUSCULAR | Status: AC
  Filled 2021-10-20: qty 4

## 2021-10-20 MED ORDER — ADULT MULTIVITAMIN W/MINERALS CH
1.0000 | ORAL_TABLET | Freq: Every day | ORAL | Status: DC
  Administered 2021-10-22 – 2021-10-23 (×2): 1 via ORAL
  Filled 2021-10-20 (×3): qty 1

## 2021-10-20 MED ORDER — FENTANYL CITRATE (PF) 100 MCG/2ML IJ SOLN
INTRAMUSCULAR | Status: DC | PRN
  Administered 2021-10-20 (×2): 25 ug via INTRAVENOUS
  Administered 2021-10-20: 50 ug via INTRAVENOUS
  Administered 2021-10-20: 25 ug via INTRAVENOUS

## 2021-10-20 MED ORDER — HEPARIN SODIUM (PORCINE) 1000 UNIT/ML IJ SOLN
INTRAMUSCULAR | Status: AC
  Filled 2021-10-20: qty 10

## 2021-10-20 MED ORDER — MIDAZOLAM HCL 2 MG/2ML IJ SOLN
INTRAMUSCULAR | Status: AC
  Filled 2021-10-20: qty 2

## 2021-10-20 MED ORDER — HEPARIN SODIUM (PORCINE) 1000 UNIT/ML IJ SOLN
INTRAMUSCULAR | Status: DC | PRN
  Administered 2021-10-20: 4000 [IU] via INTRAVENOUS

## 2021-10-20 MED ORDER — FENTANYL CITRATE (PF) 100 MCG/2ML IJ SOLN
INTRAMUSCULAR | Status: AC
  Filled 2021-10-20: qty 2

## 2021-10-20 MED ORDER — SODIUM CHLORIDE 0.9 % IV SOLN: INTRAVENOUS | Status: DC

## 2021-10-20 SURGICAL SUPPLY — 25 items
BALLN LUTONIX  018 4X60X130 (BALLOONS) ×3
BALLN LUTONIX 018 4X60X130 (BALLOONS) ×1
BALLN LUTONIX 018 5X220X130 (BALLOONS) ×3
BALLN LUTONIX 018 5X300X130 (BALLOONS) ×3
BALLN ULTRVRSE 2.5X300X150 (BALLOONS) ×3
BALLOON LUTONIX 018 4X60X130 (BALLOONS) IMPLANT
BALLOON LUTONIX 018 5X220X130 (BALLOONS) IMPLANT
BALLOON LUTONIX 018 5X300X130 (BALLOONS) IMPLANT
BALLOON ULTRVRSE 2.5X300X150 (BALLOONS) IMPLANT
CATH 0.018 NAVICROSS ST 135 (CATHETERS) ×2 IMPLANT
CATH ANGIO 5F PIGTAIL 65CM (CATHETERS) ×2 IMPLANT
CATH ROTAREX 135 6FR (CATHETERS) ×2 IMPLANT
CATH VERT 5X100 (CATHETERS) ×2 IMPLANT
COVER PROBE U/S 5X48 (MISCELLANEOUS) ×2 IMPLANT
DEVICE STARCLOSE SE CLOSURE (Vascular Products) ×2 IMPLANT
GLIDEWIRE ADV .035X260CM (WIRE) ×2 IMPLANT
KIT ENCORE 26 ADVANTAGE (KITS) ×2 IMPLANT
PACK ANGIOGRAPHY (CUSTOM PROCEDURE TRAY) ×3 IMPLANT
SHEATH BRITE TIP 55CM 6FR (SHEATH) ×2 IMPLANT
SHEATH BRITE TIP 5FRX11 (SHEATH) ×2 IMPLANT
STENT VIABAHN 6X250X120 (Permanent Stent) ×2 IMPLANT
SYR MEDRAD MARK 7 150ML (SYRINGE) ×2 IMPLANT
TUBING CONTRAST HIGH PRESS 72 (TUBING) ×2 IMPLANT
WIRE G V18X300CM (WIRE) ×2 IMPLANT
WIRE GUIDERIGHT .035X150 (WIRE) ×2 IMPLANT

## 2021-10-20 NOTE — Progress Notes (Addendum)
PROGRESS NOTE  [name] TIR:[id number] DOB: Jan 13, 1946   PCP: Albina Billet, MD  Patient is from: Home.  DOA: 10/18/2021 LOS: 1  Chief complaints:  Chief Complaint  Patient presents with   Wound Infection     Brief Narrative / Interim history: 75 year old F with PMH of PVD s/p thrombectomy and stent, nonhealing right foot ulcer/gangrene, CAD, COPD, HTN, depression, hypothyroidism, chronic pain and tobacco use disorder sent to ED by podiatry for right foot ulceration and possible cellulitis.  Started on IV Zosyn.  Vascular surgery added IV vancomycin.  Patient underwent thrombectomy and treatment of previous stents with angioplasty balloon on 10/20/2021.  Plan for to amputation by podiatry on 10/21/2021.  Subjective: Seen and examined earlier this morning after she returned from surgery.  Reports pain in her foot.  Also wants to use bathroom.  She denies chest pain or dyspnea.  Objective: Vitals:   10/20/21 0945 10/20/21 1000 10/20/21 1015 10/20/21 1139  BP: 139/73 130/73 123/66 128/68  Pulse: (!) 107 98 96 96  Resp: 18 17 17 18   Temp:    98 F (36.7 C)  TempSrc:    Oral  SpO2: 95% 97% 97% 100%  Weight:      Height:        Intake/Output Summary (Last 24 hours) at 10/20/2021 1334 Last data filed at 10/20/2021 0400 Gross per 24 hour  Intake 1448.63 ml  Output --  Net 1448.63 ml   Filed Weights   10/18/21 1505  Weight: 50 kg    Examination:   GENERAL: Frail and chronically ill-appearing.  No apparent distress. HEENT: MMM.  Vision and hearing grossly intact.  NECK: Supple.  No apparent JVD.  RESP: 100% on 2 L.  No IWOB.  Fair aeration bilaterally. CVS:  RRR. Heart sounds normal.  ABD/GI/GU: BS+. Abd soft, NTND.  MSK/EXT: Significant muscle mass and subcu fat loss.  Missing second and third right toes.  Ulceration at the tip of the right fourth toe.  Palpable DP pulse in right foot.  SKIN: As above. NEURO: Awake and alert. Oriented appropriately.  No apparent  focal neuro deficit. PSYCH: Calm. Normal affect.    Procedures:  Procedure(s) Performed:             1.  Ultrasound guidance for vascular access left femoral artery             2.  Catheter placement into right common femoral artery from left femoral approach             3.  Aortogram and selective right lower extremity angiogram             4.  Percutaneous transluminal angioplasty of right anterior tibial artery with 2-1/2 mm diameter by 30 cm length angioplasty balloon throughout and with a 4 mm diameter Lutonix drug-coated angioplasty balloon in the proximal portion             5.  Rota Rex mechanical thrombectomy to the right SFA and popliteal arteries             6.  Percutaneous transluminal angioplasty of the right SFA and popliteal arteries with 5 mm diameter by 30 cm length Lutonix drug-coated angioplasty balloon             7.  Viabahn stent placement to the right SFA with 6 mm diameter by 25 cm length stent for residual stenosis after angioplasty and thrombectomy  8.  StarClose closure device left femoral artery  Microbiology summarized: COVID-19 and influenza PCR nonreactive. Blood cultures NGTD.  Assessment & Plan: Right foot ulceration/gangrene/PAD s/p mechanical thrombectomy and stent angioplasty of the right lower extremity and third right toe amputation in 12/2020 and second right toe amputation in 04/2021 Possible right foot cellulitis: Abnormal soft tissue gas seen surrounding the fourth distal phalanx concerning for cellulitis. No definite lytic destruction is seen to suggest osteomyelitis -S/p revascularization with thrombectomy and treatment of previous stent with PCA -Continue IV Zosyn.  Vascular surgery added IV vancomycin -Plan for toe amputation on 12/2. -Encouraged tobacco cessation.  Declined nicotine patch. -Continue aspirin and statin  Chronic COPD/tobacco use disorder: Stable. -Continue breathing treatments -Encourage tobacco cessation  History  of CAD/HTN -Started on aspirin as above.  Hypothyroidism: -Continue home Synthroid.  Anxiety/depression/chronic pain: Stable. -Continue home medications  Hypokalemia/hypomagnesemia: Resolved. -Replenish and recheck.  Chronic anticoagulation: On Eliquis.  No history of arrhythmia or VTE per chart review. -We will clarify indication  Severe malnutrition: As evidenced by significant muscle mass and subcu fat loss and low BMI Body mass index is 18.92 kg/m.  -Consult dietitian.       DVT prophylaxis:  Resume Eliquis  Code Status: Full code Family Communication: Patient and/or RN. Available if any question.  Level of care: Med-Surg Status is: Inpatient  Remains inpatient appropriate because: Critical limb ischemia and cellulitis and toe amputation       Consultants:  Podiatry Vascular surgery   Sch Meds:  Scheduled Meds:  aspirin EC  81 mg Oral Daily   atorvastatin  10 mg Oral Daily   buPROPion  150 mg Oral Daily   clopidogrel  75 mg Oral Daily   docusate sodium  100 mg Oral BID   escitalopram  10 mg Oral Q1500   fentaNYL       fentaNYL       heparin sodium (porcine)       ipratropium-albuterol  3 mL Nebulization QID   levothyroxine  88 mcg Oral Q0600   midazolam       midazolam       mometasone-formoterol  2 puff Inhalation BID   montelukast  10 mg Oral q1800   sodium chloride flush  3 mL Intravenous Q12H   traZODone  200 mg Oral QHS   Continuous Infusions:  sodium chloride 75 mL/hr at 10/20/21 0635   piperacillin-tazobactam (ZOSYN)  IV 3.375 g (10/20/21 0552)   vancomycin 750 mg (10/19/21 2341)   PRN Meds:.acetaminophen **OR** acetaminophen, albuterol, ALPRAZolam, bisacodyl, hydrALAZINE, HYDROmorphone (DILAUDID) injection, HYDROmorphone, morphine injection, nicotine, ondansetron **OR** ondansetron (ZOFRAN) IV, ondansetron (ZOFRAN) IV, oxyCODONE, polyethylene glycol  Antimicrobials: Anti-infectives (From admission, onward)    Start     Dose/Rate  Route Frequency Ordered Stop   10/20/21 0720  ceFAZolin (ANCEF) IVPB 2g/100 mL premix  Status:  Discontinued       Note to Pharmacy: To be given in specials   2 g 200 mL/hr over 30 Minutes Intravenous 30 min pre-op 10/20/21 0715 10/20/21 1008   10/19/21 2300  vancomycin (VANCOREADY) IVPB 750 mg/150 mL        750 mg 150 mL/hr over 60 Minutes Intravenous Every 24 hours 10/19/21 0214     10/19/21 0600  piperacillin-tazobactam (ZOSYN) IVPB 3.375 g        3.375 g 12.5 mL/hr over 240 Minutes Intravenous Every 8 hours 10/19/21 0058     10/19/21 0100  piperacillin-tazobactam (ZOSYN) IVPB 3.375 g  Status:  Discontinued  3.375 g 100 mL/hr over 30 Minutes Intravenous Every 6 hours 10/19/21 0054 10/19/21 0057   10/18/21 2230  piperacillin-tazobactam (ZOSYN) IVPB 3.375 g        3.375 g 100 mL/hr over 30 Minutes Intravenous  Once 10/18/21 2216 10/19/21 0040   10/18/21 2200  vancomycin (VANCOCIN) IVPB 1000 mg/200 mL premix        1,000 mg 200 mL/hr over 60 Minutes Intravenous  Once 10/18/21 2155 10/19/21 0009        I have personally reviewed the following labs and images: CBC: Recent Labs  Lab 10/16/21 0603 10/18/21 1508 10/19/21 0626  WBC 10.3 13.6* 8.7  NEUTROABS  --  9.8*  --   HGB 12.4 12.3 11.9*  HCT 37.6 38.0 37.4  MCV 87.4 88.6 87.8  PLT 461* 477* 342   BMP &GFR Recent Labs  Lab 10/16/21 0603 10/18/21 1508 10/19/21 0626 10/20/21 0401  NA 138 135 136 139  K 3.9 3.7 3.4* 3.7  CL 105 99 105 109  CO2 24 26 23 27   GLUCOSE 122* 103* 85 100*  BUN 16 12 11 14   CREATININE 0.63 0.61 0.61 0.52  CALCIUM 9.0 9.1 8.8* 8.2*  MG  --   --  1.7  --    Estimated Creatinine Clearance: 48.7 mL/min (by C-G formula based on SCr of 0.52 mg/dL). Liver & Pancreas: Recent Labs  Lab 10/16/21 0603 10/18/21 1508  AST 25 27  ALT 50* 36  ALKPHOS 134* 109  BILITOT 0.7 0.7  PROT 6.4* 7.2  ALBUMIN 3.7 3.8   No results for input(s): LIPASE, AMYLASE in the last 168 hours. No results  for input(s): AMMONIA in the last 168 hours. Diabetic: No results for input(s): HGBA1C in the last 72 hours. No results for input(s): GLUCAP in the last 168 hours. Cardiac Enzymes: No results for input(s): CKTOTAL, CKMB, CKMBINDEX, TROPONINI in the last 168 hours. No results for input(s): PROBNP in the last 8760 hours. Coagulation Profile: No results for input(s): INR, PROTIME in the last 168 hours. Thyroid Function Tests: No results for input(s): TSH, T4TOTAL, FREET4, T3FREE, THYROIDAB in the last 72 hours. Lipid Profile: No results for input(s): CHOL, HDL, LDLCALC, TRIG, CHOLHDL, LDLDIRECT in the last 72 hours. Anemia Panel: No results for input(s): VITAMINB12, FOLATE, FERRITIN, TIBC, IRON, RETICCTPCT in the last 72 hours. Urine analysis:    Component Value Date/Time   COLORURINE YELLOW 10/19/2021 0011   APPEARANCEUR CLEAR 10/19/2021 0011   LABSPEC <1.005 (L) 10/19/2021 0011   PHURINE 6.0 10/19/2021 0011   GLUCOSEU NEGATIVE 10/19/2021 0011   HGBUR NEGATIVE 10/19/2021 0011   BILIRUBINUR NEGATIVE 10/19/2021 0011   KETONESUR NEGATIVE 10/19/2021 0011   PROTEINUR NEGATIVE 10/19/2021 0011   NITRITE NEGATIVE 10/19/2021 0011   LEUKOCYTESUR SMALL (A) 10/19/2021 0011   Sepsis Labs: Invalid input(s): PROCALCITONIN, [place]  Microbiology: Recent Results (from the past 240 hour(s))  Blood culture (routine x 2)     Status: None (Preliminary result)   Collection Time: 10/18/21 10:42 PM   Specimen: BLOOD  Result Value Ref Range Status   Specimen Description BLOOD RIGHT ASSIST CONTROL  Final   Special Requests   Final    BOTTLES DRAWN AEROBIC AND ANAEROBIC Blood Culture adequate volume   Culture   Final    NO GROWTH 2 DAYS Performed at [facility], [address] [place], [place] [address]    Report Status PENDING  Incomplete  Blood culture (routine x 2)     Status: None (Preliminary  result)   Collection Time: 10/18/21 10:47 PM   Specimen: BLOOD  Result Value Ref  Range Status   Specimen Description BLOOD RIGHT FOREARM  Final   Special Requests   Final    BOTTLES DRAWN AEROBIC AND ANAEROBIC Blood Culture adequate volume   Culture   Final    NO GROWTH 2 DAYS Performed at [facility], [address] [place], [place] [address]    Report Status PENDING  Incomplete  Resp Panel by RT-PCR (Flu A&B, Covid) Nasopharyngeal Swab     Status: None   Collection Time: 10/18/21 10:57 PM   Specimen: Nasopharyngeal Swab; Nasopharyngeal(NP) swabs in vial transport medium  Result Value Ref Range Status   SARS Coronavirus 2 by RT PCR NEGATIVE NEGATIVE Final    Comment: (NOTE) SARS-CoV-2 target nucleic acids are NOT DETECTED.  The SARS-CoV-2 RNA is generally detectable in upper respiratory specimens during the acute phase of infection. The lowest concentration of SARS-CoV-2 viral copies this assay can detect is 138 copies/mL. A negative result does not preclude SARS-Cov-2 infection and should not be used as the sole basis for treatment or other patient management decisions. A negative result may occur with  improper specimen collection/handling, submission of specimen other than nasopharyngeal swab, presence of viral mutation(s) within the areas targeted by this assay, and inadequate number of viral copies(<138 copies/mL). A negative result must be combined with clinical observations, patient history, and epidemiological information. The expected result is Negative.  Fact Sheet for Patients:  EntrepreneurPulse.com.au  Fact Sheet for Healthcare Providers:  IncredibleEmployment.be  This test is no t yet approved or cleared by the [place] FDA and  has been authorized for detection and/or diagnosis of SARS-CoV-2 by FDA under an Emergency Use Authorization (EUA). This EUA will remain  in effect (meaning this test can be used) for the duration of the COVID-19 declaration under Section 564(b)(1) of the Act,  21 U.S.C.section 360bbb-3(b)(1), unless the authorization is terminated  or revoked sooner.       Influenza A by PCR NEGATIVE NEGATIVE Final   Influenza B by PCR NEGATIVE NEGATIVE Final    Comment: (NOTE) The Xpert Xpress SARS-CoV-2/FLU/RSV plus assay is intended as an aid in the diagnosis of influenza from Nasopharyngeal swab specimens and should not be used as a sole basis for treatment. Nasal washings and aspirates are unacceptable for Xpert Xpress SARS-CoV-2/FLU/RSV testing.  Fact Sheet for Patients: EntrepreneurPulse.com.au  Fact Sheet for Healthcare Providers: IncredibleEmployment.be  This test is not yet approved or cleared by the [place] FDA and has been authorized for detection and/or diagnosis of SARS-CoV-2 by FDA under an Emergency Use Authorization (EUA). This EUA will remain in effect (meaning this test can be used) for the duration of the COVID-19 declaration under Section 564(b)(1) of the Act, 21 U.S.C. section 360bbb-3(b)(1), unless the authorization is terminated or revoked.  Performed at [facility], [address] [place], [place]  [address]     Radiology Studies: PERIPHERAL VASCULAR CATHETERIZATION  Result Date: 10/20/2021 See surgical note for result.      Niel Peretti T. [place]  If 7PM-7AM, please contact night-coverage [email or web address] 10/20/2021, 1:34 PM

## 2021-10-20 NOTE — Progress Notes (Signed)
Initial Nutrition Assessment  DOCUMENTATION CODES:   Non-severe (moderate) malnutrition in context of chronic illness  INTERVENTION:   -1 packet Juven BID, each packet provides 95 calories, 2.5 grams of protein (collagen), and 9.8 grams of carbohydrate (3 grams sugar); also contains 7 grams of L-arginine and L-glutamine, 300 mg vitamin C, 15 mg vitamin E, 1.2 mcg vitamin B-12, 9.5 mg zinc, 200 mg calcium, and 1.5 g  Calcium Beta-hydroxy-Beta-methylbutyrate to support wound healing  -Magic cup TID with meals, each supplement provides 290 kcal and 9 grams of protein  -MVI with minerals daily -Liberalize diet to regular  NUTRITION DIAGNOSIS:   Moderate Malnutrition related to chronic illness (COPD) as evidenced by energy intake < or equal to 75% for > or equal to 1 month, mild fat depletion, moderate fat depletion, mild muscle depletion, moderate muscle depletion.  GOAL:   Patient will meet greater than or equal to 90% of their needs  MONITOR:   PO intake, Supplement acceptance, Diet advancement, Labs, Weight trends, Skin, I & O's  REASON FOR ASSESSMENT:   Consult Assessment of nutrition requirement/status  ASSESSMENT:   [name] is a 75 y.o. female seen in ed with complaints of presenting with infectious the fourth MTP.  Patient has seen Dr. Vickki Muff at [facility] clinic and saw him today who referred her here.  Patient presents with multiple chronic complaints.  Foot pain from several years back pain for several years states that she was seen by her podiatrist and was asked to come to the hospital states that she likely needs to have an amputation.  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   12/1- s/p aortogram   Reviewed I/O's: +1.4 L x 24 hours and +1.6 L since admission  UOP: 150 ml x 24 hours  Spoke with pt at bedside, who reports that she has had chronic issues with her rt toes over the past year and has been followed by podiatry for this. She  noticed her toe getting worse about a week ago after her dog stepped on her toe. She reports plan for rt 4th toe amputation tomorrow.   Pt shares that she has a fair appetite. Noted meal completion 75%. Pt is missing upper teeth and is awaiting upper partial plate, which she has been unable to obtain due to multiple health issues. She denies any difficulty chewing or swallowing foods. Pt consuming crackers at time of visit without difficulties.   PTA pt shares that she consumed 2 meals per day (Breakfast: egg and toast; Dinner: meat, starch, and vegetable or Hot Pockets).   Per pt, she reports that she became extremely dehydrated PTA and estimates that she lost about 50 pounds within the past week because of this. She shares with this RD that she became very dehydrated PTA and started hallucinating- seeing decapitated cats and tigers on her ashtray and dining room table. Reviewed wt hx; pt has experienced a 8% wt loss over the past 6 months, which is not significant for time frame.   Discussed importance of good meal and supplement intake to promote healing. Pt did not take supplements of vitamin PTA but is willing to take them here.   Medications reviewed and include colace and 0.9% sodium chloride infusion @ 75 ml/hr.   Labs reviewed.   NUTRITION - FOCUSED PHYSICAL EXAM:  Flowsheet Row Most Recent Value  Orbital Region Mild depletion  Upper Arm Region Moderate depletion  Thoracic and Lumbar Region Mild depletion  Buccal Region  Mild depletion  Temple Region Moderate depletion  Clavicle Bone Region Moderate depletion  Clavicle and Acromion Bone Region Moderate depletion  Scapular Bone Region Moderate depletion  Dorsal Hand Mild depletion  Patellar Region Moderate depletion  Anterior Thigh Region Moderate depletion  Posterior Calf Region Moderate depletion  Edema (RD Assessment) None  Hair Reviewed  Eyes Reviewed  Mouth Reviewed  Skin Reviewed  Nails Reviewed       Diet Order:    Diet Order             Diet NPO time specified  Diet effective ____           Diet Heart Room service appropriate? Yes; Fluid consistency: Thin  Diet effective now                   EDUCATION NEEDS:   Education needs have been addressed  Skin:  Skin Assessment: Reviewed RN Assessment  Last BM:  10/16/21  Height:   Ht Readings from Last 1 Encounters:  10/18/21 5\' 4"  (1.626 m)    Weight:   Wt Readings from Last 1 Encounters:  10/18/21 50 kg    Ideal Body Weight:  54.5 kg  BMI:  Body mass index is 18.92 kg/m.  Estimated Nutritional Needs:   Kcal:  1800-2000  Protein:  100-115 grams  Fluid:  > 1.8 L    Loistine Chance, RD, LDN, [place] Registered Dietitian II Certified Diabetes Care and Education Specialist Please refer to [facility] for RD and/or RD on-call/weekend/after hours pager

## 2021-10-20 NOTE — Op Note (Signed)
[facility]  Percutaneous Study/Intervention Procedural Note   Date of Surgery: 10/20/2021  Surgeon(s):Noemie Devivo    Assistants:none  Pre-operative Diagnosis: PAD with gangrene right leg  Post-operative diagnosis:  Same  Procedure(s) Performed:             1.  Ultrasound guidance for vascular access left femoral artery             2.  Catheter placement into right common femoral artery from left femoral approach             3.  Aortogram and selective right lower extremity angiogram             4.  Percutaneous transluminal angioplasty of right anterior tibial artery with 2-1/2 mm diameter by 30 cm length angioplasty balloon throughout and with a 4 mm diameter Lutonix drug-coated angioplasty balloon in the proximal portion             5.  Rota Rex mechanical thrombectomy to the right SFA and popliteal arteries  6.  Percutaneous transluminal angioplasty of the right SFA and popliteal arteries with 5 mm diameter by 30 cm length Lutonix drug-coated angioplasty balloon  7.  Viabahn stent placement to the right SFA with 6 mm diameter by 25 cm length stent for residual stenosis after angioplasty and thrombectomy             8.  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 The patient is brought in for angiography for further evaluation and potential treatment.  Due to the limb threatening nature of the situation, angiogram was performed for attempted limb salvage. The patient is aware that if the procedure fails, amputation would be expected.  The patient also understands that even with successful  revascularization, amputation may still be required due to the severity of the situation.  Risks and benefits are discussed and informed consent is obtained.   Procedure:  The patient was identified and appropriate procedural time out was performed.  The patient was then placed supine on the table and prepped and draped in the usual sterile fashion. Moderate conscious sedation was administered during a face to face encounter with the patient throughout the procedure with my supervision of the RN administering medicines and monitoring the patient's vital signs, pulse oximetry, telemetry and mental status throughout from the start of the procedure until the patient was taken to the recovery room. Ultrasound was used to evaluate the left common femoral artery.  It was patent .  A digital ultrasound image was acquired.  A Seldinger needle was used to access the left common femoral artery under direct ultrasound guidance and a permanent image was performed.  A 0.035 J wire was advanced without resistance and a 5Fr sheath was placed.  Pigtail catheter was placed into the aorta and an AP aortogram was performed. This demonstrated that the left renal artery appeared to have relatively high-grade stenosis.  Right renal artery did not have any obvious stenosis.  Aorta and iliac arteries were widely patent. I then crossed the aortic bifurcation and advanced  to the right femoral head. Selective right lower extremity angiogram was then performed. This demonstrated somewhat diffusely diseased SFA with areas of greater than 50% stenosis in the proximal and mid segment and then an occlusion in the mid to distal segment above the previously placed stent.  The stent was occluded and the stents went all the way down to the proximal portion of the anterior tibial artery.  The anterior tibial artery was occluded to the mid segment where it reconstituted and did feed the foot.  Collaterals also fed the posterior tibial artery in the  mid distal segment going into the foot. It was felt that it was in the patient's best interest to proceed with intervention after these images to avoid a second procedure and a larger amount of contrast and fluoroscopy based off of the findings from the initial angiogram. The patient was systemically heparinized and a 6 French 55 cm sheath was then placed over the Terumo Advantage wire. I then used a Kumpe catheter and the advantage wire to navigate across the SFA, popliteal, and anterior tibial artery occlusions and confirm intraluminal flow with a Nava cross catheter in the distal anterior tibial artery.  I then exchanged for a 0.018 wire.  A 2.5 mm diameter by 30 cm length angioplasty balloon was then inflated in the anterior tibial artery from the mid to distal segment up into the stent.  It was taken up to 12 atm for 1 minute.  Mechanical thrombectomy was then performed with the [place] Rex device throughout the right SFA and popliteal arteries above and through the previously placed stents with 2 passes made.  This resulted in a channel of blood flow and some improvement, but remained high-grade residual stenosis both distal edge of the stent as well as thrombus and stenosis at the proximal edge of the previously placed stent and stenosis in the SFA above the previous stent.  I treated the distal edge of the stent in the proximal anterior tibial artery with a 4 mm diameter by 6 cm length Lutonix drug-coated angioplasty balloon inflated to 8 atm for 1 minute.  The SFA and popliteal stents as well as the SFA above the stents were treated with a 5 mm diameter by 30 cm length Lutonix drug-coated angioplasty balloon inflated to 10 atm for 1 minute.  The SFA above the stents remain with greater than 50% residual stenosis so I elected to extend the stent proximally.  A 6 mm diameter by 25 cm length Viabahn stent was deployed and postdilated with a 5 mm balloon with excellent angiographic completion result and less than  10% residual stenosis.  The anterior tibial artery had less than 20% residual stenosis as well. I elected to terminate the procedure. The sheath was removed and StarClose closure device was deployed in the left femoral artery with excellent hemostatic result. The patient was taken to the recovery room in stable condition having tolerated the procedure well.  Findings:               Aortogram: Left renal artery appeared to have relatively high-grade stenosis.  Right renal arteries not have any obvious stenosis.  Aorta and iliac arteries were widely patent.             Right Lower Extremity:  This demonstrated somewhat diffusely diseased SFA with areas of greater than 50% stenosis in the proximal and mid segment and then an occlusion in the mid to distal segment above the previously placed stent.  The stent  was occluded and the stents went all the way down to the proximal portion of the anterior tibial artery.  The anterior tibial artery was occluded to the mid segment where it reconstituted and did feed the foot.  Collaterals also fed the posterior tibial artery in the mid distal segment going into the foot.   Disposition: Patient was taken to the recovery room in stable condition having tolerated the procedure well.  Complications: None  Leotis Pain 10/20/2021 9:45 AM   This note was created with Dragon Medical transcription system. Any errors in dictation are purely unintentional.

## 2021-10-20 NOTE — Evaluation (Addendum)
Physical Therapy Evaluation Patient Details Name: [name] MRN: [id number] DOB: [birth year] Today's Date: 10/20/2021  History of Present Illness  [name] is a 75 y.o. female seen in ed with complaints of presenting with infectious the fourth MTP.  Patient has seen Dr. Vickki Muff at [facility] who referred her here.  Patient presents with multiple chronic complaints.  Foot pain from several years back pain for several years states that she was seen by her podiatrist and was asked to come to the hospital states that she likely needs to have an amputation.  Pt has past medical history of COPD, CAD, peripheral vascular disease status post mechanical thrombectomy and stent angioplasty of the right lower extremity with gangrene of the right toes in the past. Pt is scheduled for R 4th toe amputation on 12/2.  Clinical Impression  Pt was alert and oriented and lying in bed upon arrival of physical therapist. Pt reports she is having pain in her right foot that is extreme in nature. Pt was able to sit up in bed and come to standing with supervision. Pt has 5 STE her home with rail on the left side ascending. Pt reports prior to hospital stay she was able to navigate these without significant difficulty. Pt utilizes rollator as primary means of ambulation  around her home and community. Pt is having R R 4th toe amputation   on 10/21/21 and hopes to be discharged home quickly following. Pt will benefit from skilled PT intervention in order to improve her LE strength, improve her ambulatory capacity and to ensure safety with discharge following hospital stay.      Recommendations for follow up therapy are one component of a multi-disciplinary discharge planning process, led by the attending physician.  Recommendations may be updated based on patient status, additional functional criteria and insurance authorization.  Follow Up Recommendations Home health PT    Assistance Recommended at Discharge  Intermittent Supervision/Assistance  Functional Status Assessment Patient has had a recent decline in their functional status and demonstrates the ability to make significant improvements in function in a reasonable and predictable amount of time.  Equipment Recommendations       Recommendations for Other Services       Precautions / Restrictions Precautions Precautions: None Restrictions Weight Bearing Restrictions: No      Mobility  Bed Mobility Overal bed mobility: Independent             General bed mobility comments: did not utilize bed rails for bed mobility.    Transfers Overall transfer level: Modified independent                      Ambulation/Gait Ambulation/Gait assistance: Min guard   Assistive device: Rolling walker (2 wheels)         General Gait Details: Performed standing marching as pt was not up for ambulating at this time. able to perform 30x marching alternating LEs  Stairs            Wheelchair Mobility    Modified Rankin (Stroke Patients Only)       Balance                                             Pertinent Vitals/Pain Pain Assessment: 0-10 Pain Score: 9  Pain Location: R foot/ toe Pain Descriptors / Indicators: Constant Pain  Intervention(s): Limited activity within patient's tolerance;Monitored during session    [place] expects to be discharged to:: Private residence Living Arrangements: Alone (caregivers come by daily and neighbors are able to help) Available Help at Discharge: Family;Personal care attendant;Available PRN/intermittently Type of Home: House Home Access: Stairs to enter Entrance Stairs-Rails: Right Entrance Stairs-Number of Steps: 5   Home Layout: One level Home Equipment: Conservation officer, nature (2 wheels);Rollator (4 wheels)      Prior Function Prior Level of Function : Independent/Modified Independent;Needs assist       Physical Assist : Mobility  (physical) Mobility (physical): Gait   Mobility Comments: Pt reports she was living independently with help of caregivers. REports she does ont get around much due to fear of falling. Ambulates with rollator as primary means of ambulation.       Hand Dominance   Dominant Hand: Right    Extremity/Trunk Assessment        Lower Extremity Assessment Lower Extremity Assessment: RLE deficits/detail;LLE deficits/detail RLE Deficits / Details: RLE WNL (4+/5 with hip flexion, and abduciton, kne flex and extension, and ankle DF/ PF LLE Deficits / Details: 3+/5 with hip flexion, abduction, and knee flexion and extension. LLE: Unable to fully assess due to pain (DF and PF)       Communication   Communication: HOH  Cognition Arousal/Alertness: Awake/alert Behavior During Therapy: WFL for tasks assessed/performed Overall Cognitive Status: Within Functional Limits for tasks assessed                                          General Comments General comments (skin integrity, edema, etc.): able to maintain single leg balance with assistance from rolling walker for 30 seonds on the left side.    Exercises Total Joint Exercises Ankle Circles/Pumps: 10 reps;AROM;Both Marching in Standing: Both;20 reps;Standing   Assessment/Plan    PT Assessment Patient needs continued PT services  PT Problem List Decreased strength;Decreased activity tolerance;Decreased balance;Decreased mobility;Pain       PT Treatment Interventions Gait training;Functional mobility training;Therapeutic activities;Therapeutic exercise;Balance training;Neuromuscular re-education;Patient/family education    PT Goals (Current goals can be found in the Care Plan section)  Acute Rehab PT Goals Patient Stated Goal: Pt wants to be able to go home following discharge from hospital PT Goal Formulation: With patient Time For Goal Achievement: 11/03/21 Potential to Achieve Goals: Good    Frequency Min  2X/week   Barriers to discharge  Support at home      Co-evaluation               AM-PAC PT "6 Clicks" Mobility  Outcome Measure Help needed turning from your back to your side while in a flat bed without using bedrails?: None Help needed moving from lying on your back to sitting on the side of a flat bed without using bedrails?: None Help needed moving to and from a bed to a chair (including a wheelchair)?: None Help needed standing up from a chair using your arms (e.g., wheelchair or bedside chair)?: A Little Help needed to walk in hospital room?: A Little Help needed climbing 3-5 steps with a railing? : A Little 6 Click Score: 21    End of Session Equipment Utilized During Treatment: Gait belt Activity Tolerance: Patient limited by pain Patient left: in bed;with call bell/phone within reach;with bed alarm set   PT Visit Diagnosis: Unsteadiness on feet (R26.81);Muscle weakness (generalized) (  M62.81);Difficulty in walking, not elsewhere classified (R26.2)    Time: 9409 - 1634     Charges:   PT Evaluation $PT Eval Low Complexity: 1 Low          Rivka Barbara PT, DPT    Particia Lather 10/20/2021, 4:46 PM

## 2021-10-20 NOTE — Progress Notes (Signed)
Daily Progress Note   Subjective  - Day of Surgery  Follow-up right fourth toe gangrenous changes.  Underwent revascularization today.  Objective Vitals:   10/20/21 0945 10/20/21 1000 10/20/21 1015 10/20/21 1139  BP: 139/73 130/73 123/66 128/68  Pulse: (!) 107 98 96 96  Resp: 18 17 17 18   Temp:    98 F (36.7 C)  TempSrc:    Oral  SpO2: 95% 97% 97% 100%  Weight:      Height:        Physical Exam: Patient resting comfortably upon entering room requesting pain medication.  States pain is fairly severe.  She states she was just provided pain medication.  Revascularization was performed with increase circulation to the right lower extremity  Laboratory CBC    Component Value Date/Time   WBC 8.7 10/19/2021 0626   HGB 11.9 (L) 10/19/2021 0626   HCT 37.4 10/19/2021 0626   PLT 342 10/19/2021 0626    BMET    Component Value Date/Time   NA 139 10/20/2021 0401   NA 143 10/06/2019 1552   NA 138 02/10/2014 1335   K 3.7 10/20/2021 0401   K 4.5 02/10/2014 1335   CL 109 10/20/2021 0401   CL 106 02/10/2014 1335   CO2 27 10/20/2021 0401   CO2 28 02/10/2014 1335   GLUCOSE 100 (H) 10/20/2021 0401   GLUCOSE 99 02/10/2014 1335   BUN 14 10/20/2021 0401   BUN 7 (L) 10/06/2019 1552   BUN 6 (L) 02/10/2014 1335   CREATININE 0.52 10/20/2021 0401   CREATININE 0.89 02/10/2014 1335   CALCIUM 8.2 (L) 10/20/2021 0401   CALCIUM 8.7 02/10/2014 1335   GFRNONAA >60 10/20/2021 0401   GFRNONAA >60 02/10/2014 1335   GFRAA >60 12/18/2019 1012   GFRAA >60 02/10/2014 1335    Assessment/Planning: Gangrene right fourth toe  Plan will be for amputation of this fourth toe tomorrow evening.  Dr. Luana Shu to perform.  I discussed the procedure with the patient today and she understands plan. Orders will be placed.  Samara Deist A  10/20/2021, 1:17 PM

## 2021-10-21 ENCOUNTER — Encounter
Admission: EM | Disposition: A | Discharge status: Home / Self Care | Payer: Self-pay | Source: Home / Self Care | Attending: Student

## 2021-10-21 ENCOUNTER — Inpatient Hospital Stay: Payer: Medicare HMO | Admitting: Anesthesiology

## 2021-10-21 DIAGNOSIS — F419 Anxiety disorder, unspecified: Secondary | ICD-10-CM | POA: Diagnosis not present

## 2021-10-21 DIAGNOSIS — L03031 Cellulitis of right toe: Secondary | ICD-10-CM | POA: Diagnosis not present

## 2021-10-21 DIAGNOSIS — I70223 Atherosclerosis of native arteries of extremities with rest pain, bilateral legs: Secondary | ICD-10-CM | POA: Diagnosis not present

## 2021-10-21 DIAGNOSIS — R0989 Other specified symptoms and signs involving the circulatory and respiratory systems: Secondary | ICD-10-CM | POA: Diagnosis not present

## 2021-10-21 HISTORY — PX: AMPUTATION TOE: SHX6595

## 2021-10-21 LAB — SURGICAL PCR SCREEN
MRSA, PCR: NEGATIVE
Staphylococcus aureus: NEGATIVE

## 2021-10-21 SURGERY — AMPUTATION, TOE
Anesthesia: General | Site: Toe | Laterality: Right

## 2021-10-21 MED ORDER — IPRATROPIUM-ALBUTEROL 0.5-2.5 (3) MG/3ML IN SOLN
3.0000 mL | Freq: Three times a day (TID) | RESPIRATORY_TRACT | Status: DC
  Administered 2021-10-21 – 2021-10-23 (×7): 3 mL via RESPIRATORY_TRACT
  Filled 2021-10-21 (×7): qty 3

## 2021-10-21 MED ORDER — SODIUM CHLORIDE 0.9 % IV SOLN: INTRAVENOUS | Status: DC | PRN

## 2021-10-21 MED ORDER — PROPOFOL 500 MG/50ML IV EMUL
INTRAVENOUS | Status: DC | PRN
  Administered 2021-10-21: 50 ug/kg/min via INTRAVENOUS

## 2021-10-21 MED ORDER — OXYCODONE HCL 5 MG/5ML PO SOLN: 5.0000 mg | Freq: Once | ORAL | Status: DC | PRN

## 2021-10-21 MED ORDER — FENTANYL CITRATE (PF) 100 MCG/2ML IJ SOLN
INTRAMUSCULAR | Status: DC | PRN
  Administered 2021-10-21: 12.5 ug via INTRAVENOUS
  Administered 2021-10-21: 25 ug via INTRAVENOUS
  Administered 2021-10-21: 12.5 ug via INTRAVENOUS

## 2021-10-21 MED ORDER — FENTANYL CITRATE (PF) 100 MCG/2ML IJ SOLN: 25.0000 ug | INTRAMUSCULAR | Status: DC | PRN

## 2021-10-21 MED ORDER — PROPOFOL 10 MG/ML IV BOLUS
INTRAVENOUS | Status: DC | PRN
  Administered 2021-10-21: 10 mg via INTRAVENOUS
  Administered 2021-10-21: 20 mg via INTRAVENOUS
  Administered 2021-10-21: 10 mg via INTRAVENOUS
  Administered 2021-10-21: 20 mg via INTRAVENOUS

## 2021-10-21 MED ORDER — PROPOFOL 500 MG/50ML IV EMUL
INTRAVENOUS | Status: AC
  Filled 2021-10-21: qty 50

## 2021-10-21 MED ORDER — LIDOCAINE HCL (CARDIAC) PF 100 MG/5ML IV SOSY
PREFILLED_SYRINGE | INTRAVENOUS | Status: DC | PRN
  Administered 2021-10-21: 2 mL via INTRATRACHEAL

## 2021-10-21 MED ORDER — OXYCODONE HCL 5 MG PO TABS: 5.0000 mg | ORAL_TABLET | Freq: Once | ORAL | Status: DC | PRN

## 2021-10-21 MED ORDER — LIDOCAINE HCL (PF) 2 % IJ SOLN
INTRAMUSCULAR | Status: AC
  Filled 2021-10-21: qty 5

## 2021-10-21 MED ORDER — HYDROMORPHONE HCL 1 MG/ML IJ SOLN
0.5000 mg | INTRAMUSCULAR | Status: DC | PRN
  Administered 2021-10-21 – 2021-10-22 (×3): 0.5 mg via INTRAVENOUS
  Filled 2021-10-21 (×3): qty 1

## 2021-10-21 MED ORDER — HYDROMORPHONE HCL 1 MG/ML IJ SOLN
0.5000 mg | INTRAMUSCULAR | Status: AC | PRN
  Administered 2021-10-21: 0.5 mg via INTRAVENOUS
  Filled 2021-10-21: qty 1

## 2021-10-21 MED ORDER — FENTANYL CITRATE (PF) 100 MCG/2ML IJ SOLN
INTRAMUSCULAR | Status: AC
  Filled 2021-10-21: qty 2

## 2021-10-21 MED ORDER — ACETAMINOPHEN 10 MG/ML IV SOLN: 1000.0000 mg | Freq: Once | INTRAVENOUS | Status: DC | PRN

## 2021-10-21 MED ORDER — BUPIVACAINE HCL 0.5 % IJ SOLN
INTRAMUSCULAR | Status: DC | PRN
  Administered 2021-10-21: 10 mL

## 2021-10-21 MED ORDER — SODIUM CHLORIDE 0.9 % IR SOLN
Status: DC | PRN
  Administered 2021-10-21: 200 mL

## 2021-10-21 MED ORDER — ONDANSETRON HCL 4 MG/2ML IJ SOLN
INTRAMUSCULAR | Status: DC | PRN
  Administered 2021-10-21: 4 mg via INTRAVENOUS

## 2021-10-21 MED ORDER — ALBUTEROL SULFATE HFA 108 (90 BASE) MCG/ACT IN AERS
INHALATION_SPRAY | RESPIRATORY_TRACT | Status: DC | PRN
  Administered 2021-10-21: 2 via RESPIRATORY_TRACT

## 2021-10-21 SURGICAL SUPPLY — 30 items
BNDG ELASTIC 4X5.8 VLCR STR LF (GAUZE/BANDAGES/DRESSINGS) ×3 IMPLANT
BNDG ESMARK 4X12 TAN STRL LF (GAUZE/BANDAGES/DRESSINGS) ×3 IMPLANT
BNDG GAUZE ELAST 4 BULKY (GAUZE/BANDAGES/DRESSINGS) ×3 IMPLANT
DURAPREP 26ML APPLICATOR (WOUND CARE) ×3 IMPLANT
ELECT REM PT RETURN 9FT ADLT (ELECTROSURGICAL) ×3
ELECTRODE REM PT RTRN 9FT ADLT (ELECTROSURGICAL) ×1 IMPLANT
GAUZE 4X4 16PLY ~~LOC~~+RFID DBL (SPONGE) ×3 IMPLANT
GAUZE SPONGE 4X4 12PLY STRL (GAUZE/BANDAGES/DRESSINGS) ×4 IMPLANT
GAUZE XEROFORM 1X8 LF (GAUZE/BANDAGES/DRESSINGS) ×3 IMPLANT
GLOVE SURG ENC MOIS LTX SZ7 (GLOVE) ×3 IMPLANT
GLOVE SURG UNDER LTX SZ7 (GLOVE) ×3 IMPLANT
GOWN STRL REUS W/ TWL LRG LVL3 (GOWN DISPOSABLE) ×2 IMPLANT
GOWN STRL REUS W/TWL LRG LVL3 (GOWN DISPOSABLE) ×6
KIT TURNOVER KIT A (KITS) ×3 IMPLANT
LABEL OR SOLS (LABEL) ×3 IMPLANT
MANIFOLD NEPTUNE II (INSTRUMENTS) ×3 IMPLANT
NDL FILTER BLUNT 18X1 1/2 (NEEDLE) ×1 IMPLANT
NDL HYPO 25X1 1.5 SAFETY (NEEDLE) ×2 IMPLANT
NEEDLE FILTER BLUNT 18X 1/2SAF (NEEDLE) ×2
NEEDLE FILTER BLUNT 18X1 1/2 (NEEDLE) ×1 IMPLANT
NEEDLE HYPO 25X1 1.5 SAFETY (NEEDLE) ×6 IMPLANT
NS IRRIG 500ML POUR BTL (IV SOLUTION) ×3 IMPLANT
PACK EXTREMITY ARMC (MISCELLANEOUS) ×3 IMPLANT
STOCKINETTE STRL 6IN 960660 (GAUZE/BANDAGES/DRESSINGS) ×3 IMPLANT
SUT ETHILON 3-0 FS-10 30 BLK (SUTURE) ×6
SUT VIC AB 3-0 SH 27 (SUTURE) ×3
SUT VIC AB 3-0 SH 27X BRD (SUTURE) ×1 IMPLANT
SUTURE EHLN 3-0 FS-10 30 BLK (SUTURE) ×2 IMPLANT
SYR 10ML LL (SYRINGE) ×3 IMPLANT
WATER STERILE IRR 500ML POUR (IV SOLUTION) ×3 IMPLANT

## 2021-10-21 NOTE — TOC Progression Note (Signed)
Transition of Care [facility]) - Progression Note    Patient Details  Name: [name] MRN: [id number] Date of Birth: Dec 31, 1945  [facility]) CM/SW [place], RN Phone Number: 10/21/2021, 11:15 AM  Clinical Narrative:   Patient comes from home,  Pt is scheduled for R 4th toe amputation on 12/2 PT to eval afterwards, TOC to follow for DC planning and needs        Expected Discharge Plan and Services                                                 Social Determinants of Health (SDOH) Interventions    Readmission Risk Interventions Readmission Risk Prevention Plan 01/14/2021  PCP or Specialist Appt within 3-5 Days Complete  HRI or [place] Complete  Social Work Consult for [place] Planning/Counseling Complete  Palliative Care Screening Not Applicable  Medication Review Press photographer) Complete  Some recent data might be hidden

## 2021-10-21 NOTE — Evaluation (Signed)
Occupational Therapy Evaluation Patient Details Name: [name] MRN: [id number] DOB: 06-Sep-1946 Today's Date: 10/21/2021   History of Present Illness [name] is a 75 y.o. female seen in ed with complaints of presenting with infectious the fourth MTP.  Patient has seen Dr. Vickki Muff at [facility] who referred her here.  Patient presents with multiple chronic complaints.  Foot pain from several years back pain for several years states that she was seen by her podiatrist and was asked to come to the hospital states that she likely needs to have an amputation.  Pt has past medical history of COPD, CAD, peripheral vascular disease status post mechanical thrombectomy and stent angioplasty of the right lower extremity with gangrene of the right toes in the past. Pt is scheduled for R 4th toe amputation on 12/2.   Clinical Impression   Pt in bed upon OT arrival and requesting more pain medication.  SN notified.  Pt agreeable to OT eval.  Assisted pt up to [facility] for toileting with min guard and RW, min guard today only d/t 9/10 pain reported in RLE.  R 4th toe amputation scheduled for later this afternoon.  Pt resides alone with small dog, and previously modified indep with ADLs.  Pt reports a friend will be available to check in and assist as needed when she returns home.  Pt would benefit from [facility] OT for ensuring AE needs are met and safe ADL/functional mobility in the home upon discharge in order to reduce fall risk.  Pt receptive.  Pt will benefit from skilled OT in acute setting to reinforce fall prevention, ADL strategies, and ensuring safe ADL transfers following surgery.  Pt returned to bed with all needs met and call light/phone within reach.   Recommendations for follow up therapy are one component of a multi-disciplinary discharge planning process, led by the attending physician.  Recommendations may be updated based on patient status, additional functional criteria and insurance  authorization.   Follow Up Recommendations  Home health OT    Assistance Recommended at Discharge Intermittent Supervision/Assistance  Functional Status Assessment  Patient has had a recent decline in their functional status and demonstrates the ability to make significant improvements in function in a reasonable and predictable amount of time.                Precautions / Restrictions Precautions Precautions: None Restrictions Weight Bearing Restrictions: No      Mobility Bed Mobility Overal bed mobility: Independent             General bed mobility comments: did not utilize bed rails for bed mobility. Patient Response:  (preoccupied with pain level; RN aware and not yet able to give more pain medication)  Transfers Overall transfer level: Needs assistance   Transfers: Sit to/from Stand Sit to Stand: Min guard           General transfer comment: min guard d/t high pain levels      Balance Overall balance assessment: Needs assistance Sitting-balance support: No upper extremity supported;Feet supported Sitting balance-Leahy Scale: Good     Standing balance support: Bilateral upper extremity supported Standing balance-Leahy Scale: Fair Standing balance comment: BUE support d/t severe RLE pain                           ADL either performed or assessed with clinical judgement   ADL Overall ADL's : Needs assistance/impaired     Grooming: Wash/dry hands;Set  up;Sitting                   Toilet Transfer: Agricultural engineer (2 wheels);BSC/3in1 Armed forces technical officer Details (indicate cue type and reason): min guard d/t severe pain in RLE reported Toileting- Clothing Manipulation and Hygiene: Set up;Sitting/lateral lean       Functional mobility during ADLs: Min guard;Rolling walker (2 wheels) General ADL Comments: set up/min Guard for ADLs d/t RLE pain with limited tolerance for mobility     Vision Baseline Vision/History: 1 Wears  glasses Patient Visual Report: No change from baseline                  Pertinent Vitals/Pain Pain Score: 9  Pain Location: R foot/ toe Pain Descriptors / Indicators: Constant Pain Intervention(s): Limited activity within patient's tolerance;Patient requesting pain meds-RN notified;Monitored during session;Repositioned     Hand Dominance Right   Extremity/Trunk Assessment Upper Extremity Assessment Upper Extremity Assessment: Overall WFL for tasks assessed   Lower Extremity Assessment Lower Extremity Assessment: Defer to PT evaluation       Communication Communication Communication: HOH   Cognition Arousal/Alertness: Awake/alert Behavior During Therapy: WFL for tasks assessed/performed Overall Cognitive Status: Within Functional Limits for tasks assessed                                 General Comments: sleeping upon OT arrival but arousable and participatory during eval                       Home Living Family/patient expects to be discharged to:: Private residence Living Arrangements: Alone Available Help at Discharge: Family;Personal care attendant;Available PRN/intermittently Type of Home: House Home Access: Stairs to enter [employer] of Steps: 5 Entrance Stairs-Rails: Right Home Layout: One level     Bathroom Shower/Tub: Walk-in shower;Tub/shower unit   Bathroom Toilet: Standard     Home Equipment: Conservation officer, nature (2 wheels);Rollator (4 wheels);Shower seat   Additional Comments: Pt has friend that will be available to check in on her intermittently when pt returns home      Prior Functioning/Environment Prior Level of Function : Independent/Modified Independent       Physical Assist : Mobility (physical) Mobility (physical): Gait   Mobility Comments: Pt reports she was living independently with help of caregivers. REports she does ont get around much due to fear of falling. Ambulates with rollator as primary means  of ambulation. ADLs Comments: Modified indep with ADLs prior to admission.  Has small dog at home to care for.        OT Problem List: Decreased strength;Decreased activity tolerance;Decreased knowledge of use of DME or AE;Pain      OT Treatment/Interventions: Self-care/ADL training;Therapeutic exercise;DME and/or AE instruction;Therapeutic activities;Patient/family education    OT Goals(Current goals can be found in the care plan section) Acute Rehab OT Goals Patient Stated Goal: To improve pain in RLE OT Goal Formulation: With patient Time For Goal Achievement: 11/03/21 Potential to Achieve Goals: Good ADL Goals Pt Will Perform Grooming: Independently;standing Pt Will Perform Lower Body Dressing: Independently;sit to/from stand Pt Will Transfer to Toilet: with modified independence;regular height toilet;grab bars  OT Frequency: Min 2X/week                             AM-PAC OT "6 Clicks" Daily Activity     Outcome Measure Help from another person  eating meals?: None Help from another person taking care of personal grooming?: A Little Help from another person toileting, which includes using toliet, bedpan, or urinal?: A Little Help from another person bathing (including washing, rinsing, drying)?: A Little Help from another person to put on and taking off regular upper body clothing?: None Help from another person to put on and taking off regular lower body clothing?: A Little 6 Click Score: 20   End of Session Equipment Utilized During Treatment: Rolling walker (2 wheels);Oxygen Nurse Communication: Patient requests pain meds  Activity Tolerance: Patient limited by pain Patient left: in bed;with call bell/phone within reach;with bed alarm set  OT Visit Diagnosis: Unsteadiness on feet (R26.81);Muscle weakness (generalized) (M62.81)                Time: 8453-6468 OT Time Calculation (min): 24 min Charges:  OT General Charges $OT Visit: 1 Visit OT  Evaluation $OT Eval Moderate Complexity: 1 Mod  Leta Speller, MS, OTR/L   Darleene Cleaver 10/21/2021, 12:03 PM

## 2021-10-21 NOTE — Care Management Important Message (Signed)
Important Message  Patient Details  Name: [name] MRN: [id number] Date of Birth: [birth year]   Medicare Important Message Given:  Yes     Juliann Pulse A Aine Strycharz 10/21/2021, 2:03 PM

## 2021-10-21 NOTE — H&P (Signed)
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  The various methods of treatment have been discussed with the patient.  No guarantees were given.  After consideration of risks, benefits and other options for treatment, the patient has consented to surgery.  I have reviewed the patients' chart and labs.    PROCEDURE: RIGHT 4TH TOE AMPUTATION   A history and physical examination was performed in the hospital.  The patient was reexamined.  There have been no changes to this history and physical examination.  Caroline More, DPM

## 2021-10-21 NOTE — Transfer of Care (Signed)
Immediate Anesthesia Transfer of Care Note  Patient: [name]  Procedure(s) Performed: AMPUTATION TOE (Right: Toe)  Patient Location: PACU  Anesthesia Type:General  Level of Consciousness: drowsy  Airway & Oxygen Therapy: Patient Spontanous Breathing and Patient connected to nasal cannula oxygen  Post-op Assessment: Report given to RN and Post -op Vital signs reviewed and stable  Post vital signs: Reviewed and stable  Last Vitals:  Vitals Value Taken Time  BP 127/66 10/21/21 1800  Temp 36.8 C 10/21/21 1758  Pulse 95 10/21/21 1801  Resp 17 10/21/21 1801  SpO2 98 % 10/21/21 1801  Vitals shown include unvalidated device data.  Last Pain:  Vitals:   10/21/21 1522  TempSrc:   PainSc: 7       Patients Stated Pain Goal: 3 (76/16/07 3710)  Complications: No notable events documented.

## 2021-10-21 NOTE — Anesthesia Preprocedure Evaluation (Addendum)
Anesthesia Evaluation  Patient identified by MRN, date of birth, ID band Patient awake    Reviewed: Allergy & Precautions, NPO status , Patient's Chart, lab work & pertinent test results  History of Anesthesia Complications (+) DIFFICULT AIRWAY and history of anesthetic complications  Airway Mallampati: III  TM Distance: >3 FB Neck ROM: Limited    Dental  (+) Poor Dentition, Missing, Implants   Pulmonary shortness of breath, COPD,  COPD inhaler, Current Smoker and Patient abstained from smoking.,  Supplemental oxygen now  2L [place]  breath sounds clear to auscultation- rhonchi + wheezing      Cardiovascular Exercise Tolerance: Poor hypertension, Pt. on medications + CAD and + Peripheral Vascular Disease  (-) Past MI and (-) CABG + dysrhythmias (hx palpatations)  Rhythm:Regular Rate:Normal - Systolic murmurs and - Diastolic murmurs CAD, peripheral vascular disease status post mechanical thrombectomy and stent angioplasty of the right lower extremity with gangrene of the right toes in the past   Neuro/Psych neg Seizures PSYCHIATRIC DISORDERS Anxiety Depression  Neuromuscular disease    GI/Hepatic Neg liver ROS, hiatal hernia, neg GERD  Controlled and Medicated,  Endo/Other  neg diabetesHypothyroidism   Renal/GU negative Renal ROS     Musculoskeletal  (+) Arthritis ,   Abdominal (+) - obese,   Peds  Hematology negative hematology ROS (+)   Anesthesia Other Findings Past Medical History: No date: Anxiety No date: CAD (coronary artery disease) No date: Carpal tunnel syndrome No date: Cataract No date: Complication of anesthesia     Comment:  has woken up at times No date: COPD (chronic obstructive pulmonary disease) ([place]) 09/14/2015: CRP elevated No date: Depression No date: Difficult intubation No date: Dyslipidemia No date: Dyspnea     Comment:  DOE No date: Dysrhythmia     Comment:  hx palpatations 09/14/2015:  Elevated sedimentation rate No date: Esophageal spasm No date: Gastrointestinal parasites No date: GERD (gastroesophageal reflux disease) No date: Hiatal hernia No date: History of peptic ulcer disease No date: Hypertension No date: Hyperthyroidism No date: Hypothyroidism 09/14/2015: Low magnesium levels 2008: Pelvic fracture ([place])     Comment:  fall from riding a horse No date: Peripheral vascular disease (HCC) No date: Reflux No date: Rotator cuff injury No date: Stenosis, spinal, lumbar   Reproductive/Obstetrics                          Anesthesia Physical  Anesthesia Plan  ASA: III  Anesthesia Plan: MAC   Post-op Pain Management:    Induction: Intravenous  PONV Risk Score and Plan: 1 and Propofol infusion  Airway Management Planned: Natural Airway  Additional Equipment:   Intra-op Plan:   Post-operative Plan:   Informed Consent: I have reviewed the patients History and Physical, chart, labs and discussed the procedure including the risks, benefits and alternatives for the proposed anesthesia with the patient or authorized representative who has indicated his/her understanding and acceptance.     Dental advisory given  Plan Discussed with: CRNA and Anesthesiologist  Anesthesia Plan Comments:        Anesthesia Quick Evaluation

## 2021-10-21 NOTE — Progress Notes (Signed)
[facility] Daily Progress Note  10/20/21:             1.  Ultrasound guidance for vascular access left femoral artery             2.  Catheter placement into right common femoral artery from left femoral approach             3.  Aortogram and selective right lower extremity angiogram             4.  Percutaneous transluminal angioplasty of right anterior tibial artery with 2-1/2 mm diameter by 30 cm length angioplasty balloon throughout and with a 4 mm diameter Lutonix drug-coated angioplasty balloon in the proximal portion             5.  Rota Rex mechanical thrombectomy to the right SFA and popliteal arteries             6.  Percutaneous transluminal angioplasty of the right SFA and popliteal arteries with 5 mm diameter by 30 cm length Lutonix drug-coated angioplasty balloon             7.  Viabahn stent placement to the right SFA with 6 mm diameter by 25 cm length stent for residual stenosis after angioplasty and thrombectomy             8.  StarClose closure device left femoral artery  Subjective: Patient without complaint this AM.  No acute issues overnight.  Objective: Vitals:   10/21/21 0720 10/21/21 0804 10/21/21 1212 10/21/21 1319  BP: (!) 141/64  139/68   Pulse: (!) 110  (!) 106   Resp: 17  17   Temp: 98.5 F (36.9 C)  97.9 F (36.6 C)   TempSrc: Oral     SpO2: 100% 97% 96% 98%  Weight:      Height:        Intake/Output Summary (Last 24 hours) at 10/21/2021 1342 Last data filed at 10/21/2021 0200 Gross per 24 hour  Intake 1135.84 ml  Output 750 ml  Net 385.84 ml   Physical Exam: A&Ox3, NAD CV: RRR Pulmonary: CTA Bilaterally Abdomen: Soft, Nontender, Nondistended Left groin access site:  Clean dry and intact Vascular:  Right lower extremity: Thigh soft.  Calf soft.  Minimal edema.  Foot is warm.  Motor/sensory is intact.   Laboratory: CBC    Component Value Date/Time   WBC 8.7 10/19/2021 0626   HGB 11.9 (L) 10/19/2021 0626   HCT 37.4  10/19/2021 0626   PLT 342 10/19/2021 0626   BMET    Component Value Date/Time   NA 139 10/20/2021 0401   NA 143 10/06/2019 1552   NA 138 02/10/2014 1335   K 3.7 10/20/2021 0401   K 4.5 02/10/2014 1335   CL 109 10/20/2021 0401   CL 106 02/10/2014 1335   CO2 27 10/20/2021 0401   CO2 28 02/10/2014 1335   GLUCOSE 100 (H) 10/20/2021 0401   GLUCOSE 99 02/10/2014 1335   BUN 14 10/20/2021 0401   BUN 7 (L) 10/06/2019 1552   BUN 6 (L) 02/10/2014 1335   CREATININE 0.52 10/20/2021 0401   CREATININE 0.89 02/10/2014 1335   CALCIUM 8.2 (L) 10/20/2021 0401   CALCIUM 8.7 02/10/2014 1335   GFRNONAA >60 10/20/2021 0401   GFRNONAA >60 02/10/2014 1335   GFRAA >60 12/18/2019 1012   GFRAA >60 02/10/2014 1335   Assessment/Planning: The patient is a 75 year old female with known history of atherosclerotic disease with  ulceration presents with nonhealing ulceration to the fourth toe status post right lower extremity angiogram with intervention - POD#1  1) successful revascularization of the right lower extremity 2) recommend aspirin, Plavix and statin for medical management 3) no further recommendations from vascular at this time. 4) we will see the patient in our office for continued follow-up.  Discussed with Dr. Ellis Parents Tashyra Adduci PA-C 10/21/2021 1:42 PM

## 2021-10-21 NOTE — Op Note (Signed)
PODIATRY / FOOT AND ANKLE SURGERY OPERATIVE REPORT    SURGEON: Caroline More, DPM  PRE-OPERATIVE DIAGNOSIS:  1.  Right fourth toe gangrene 2.  PVD 3.  Chronic pain syndrome  POST-OPERATIVE DIAGNOSIS: Same  PROCEDURE(S): Right fourth toe partial amputation  HEMOSTASIS: No tourniquet  ANESTHESIA: MAC  ESTIMATED BLOOD LOSS: 15 cc  FINDING(S): 1.  Right fourth toe osteomyelitis, gangrene  PATHOLOGY/SPECIMEN(S): Right fourth toe  INDICATIONS:   [name] is a 75 y.o. female who presents with a nonhealing ulceration to the distal aspect of the right fourth toe.  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  Patient has seen Dr. Vickki Muff for a long time and while being seen this past week she was noted to have gangrenous changes present to the right fourth toe.  She was subsequently admitted for the hospital for worsening mentation as well as concerns for infection to the right fourth toe.  Patient was seen in the hospital and x-rays were taken which did not show any definitive evidence of osteomyelitis but did have apparent gangrene to the right fourth toe so vascular surgery was consulted.  Patient had CT angiogram with intervention to improve flow and now presents today for further treatment including amputation of the right fourth toe partially.  All treatment options were discussed with the patient both conservative and surgical attempts at correction clean potential risks and complications at this time patient has elected for procedure consisting of right fourth toe amputation.  No guarantees given.  DESCRIPTION: After obtaining full informed written consent, the patient was brought back to the operating room and placed supine upon the operating table.  The patient received IV antibiotics prior to induction.  After obtaining adequate anesthesia, a right fourth toe  digital block was performed with 10 cc of half percent Marcaine plain.  The patient was prepped and draped in the standard fashion.  Attention was directed to the right fourth toe at the level of the PIPJ where a fishmouth type of incision was made over this area creating full-thickness dorsal and plantar flaps.  At this time a extensor tenotomy and capsulotomy was performed at the fourth PIPJ followed by release of the collateral and suspensory ligaments as well as the plantar plate/flexor tendon area.  The toe was disarticulated at this area and passed off the operative site.  Circumferential dissection was then performed around the proximal phalanx head to the level of the midshaft portion.  At this time a bone cutter was used to create a bone cut transversely across the central aspect of the proximal phalanx.  The distal aspect of the proximal phalanx was then passed off in the operative site.  The flexor and extensor tendons were cut as far proximally as possible.  The surgical site was flushed with copious amounts of normal sterile saline.  The bone appeared to be very soft in nature consistent with likely osteoporosis.  No further evidence of bone infection present to this area, no purulent drainage or discharge.  Minimal bleeding occurred with this but a couple bleeding vessels were cauterized with electrocautery as necessary.  The skin was then reapproximated well coapted with 3-0 nylon in simple type stitching.  Capillary fill time still appeared to be mildly delayed to the skin flaps after closure unchanged compared to surgical intervention.  A postoperative dressing was applied consisting of Xeroform to the incision line followed by 4 x  4 gauze, Kerlix, Ace wrap with minimal compression.  Patient tolerated the procedure and anesthesia well and transferred to recovery room vital signs stable and vascular status still appearing to be somewhat questionable to the right lower extremity.  Patient be  discharged back to the inpatient room with the appropriate orders and instructions.  We will change dressing again tomorrow and reassess the toe.  If appears to be turning gangrenous may need more proximal amputation such as transmetatarsal amputation but we will continue to monitor for the next day and if doing well could likely discharge and continue to monitor in an outpatient setting.  COMPLICATIONS: None  CONDITION: Good, stable  Caroline More, DPM

## 2021-10-21 NOTE — Progress Notes (Signed)
PROGRESS NOTE  [name] WUJ:[id number] DOB: Aug 10, 1946   PCP: Albina Billet, MD  Patient is from: Home.  DOA: 10/18/2021 LOS: 2  Chief complaints:  Chief Complaint  Patient presents with   Wound Infection     Brief Narrative / Interim history: 75 year old F with PMH of PVD s/p thrombectomy and stent, nonhealing right foot ulcer/gangrene, CAD, COPD, HTN, depression, hypothyroidism, chronic pain and tobacco use disorder sent to ED by podiatry for right foot ulceration and possible cellulitis.  Started on IV Zosyn.  Vascular surgery added IV vancomycin.  Patient underwent thrombectomy and treatment of previous stents with angioplasty balloon on 10/20/2021.  Plan for toe amputation by podiatry on 10/21/2021.  Subjective: Seen and examined earlier this morning.  She complains pain in her right foot.  No other complaints.  Plan for right fourth toe amputation later today.  Objective: Vitals:   10/21/21 0720 10/21/21 0804 10/21/21 1212 10/21/21 1319  BP: (!) 141/64  139/68   Pulse: (!) 110  (!) 106   Resp: 17  17   Temp: 98.5 F (36.9 C)  97.9 F (36.6 C)   TempSrc: Oral  Oral   SpO2: 100% 97% 96% 98%  Weight:      Height:        Intake/Output Summary (Last 24 hours) at 10/21/2021 1708 Last data filed at 10/21/2021 0200 Gross per 24 hour  Intake 719.75 ml  Output 600 ml  Net 119.75 ml   Filed Weights   10/18/21 1505  Weight: 50 kg    Examination:`  GENERAL: Frail and chronically ill-appearing.  No apparent distress. HEENT: MMM.  Vision and hearing grossly intact.  NECK: Supple.  No apparent JVD.  RESP: On RA.  No IWOB.  Fair aeration bilaterally. CVS:  RRR. Heart sounds normal.  ABD/GI/GU: BS+. Abd soft, NTND.  MSK/EXT: Significant muscle mass and subcu fat loss.  Missing second and third right toes.  Ulceration at the tip of the right fourth toe.  Palpable right DP pulse. SKIN: Ulceration of right first toe as above.  No erythema or increased warmth to  touch. NEURO: Awake and alert. Oriented appropriately.  No apparent focal neuro deficit. PSYCH: Calm. Normal affect.    Procedures:  Procedure(s) Performed:             1.  Ultrasound guidance for vascular access left femoral artery             2.  Catheter placement into right common femoral artery from left femoral approach             3.  Aortogram and selective right lower extremity angiogram             4.  Percutaneous transluminal angioplasty of right anterior tibial artery with 2-1/2 mm diameter by 30 cm length angioplasty balloon throughout and with a 4 mm diameter Lutonix drug-coated angioplasty balloon in the proximal portion             5.  Rota Rex mechanical thrombectomy to the right SFA and popliteal arteries             6.  Percutaneous transluminal angioplasty of the right SFA and popliteal arteries with 5 mm diameter by 30 cm length Lutonix drug-coated angioplasty balloon             7.  Viabahn stent placement to the right SFA with 6 mm diameter by 25 cm length stent for residual stenosis after angioplasty and thrombectomy  8.  StarClose closure device left femoral artery  Microbiology summarized: COVID-19 and influenza PCR nonreactive. Blood cultures NGTD.  Assessment & Plan: Right foot ulceration/gangrene/PAD s/p mechanical thrombectomy and stent angioplasty of the right lower extremity and third right toe amputation in 12/2020 and second right toe amputation in 04/2021 Possible right foot cellulitis: Abnormal soft tissue gas seen surrounding the fourth distal phalanx concerning for cellulitis. No definite lytic destruction is seen to suggest osteomyelitis -S/p revascularization with thrombectomy and treatment of previous stent with PCA -On IV Zosyn and IV vancomycin -Plan for right fourth toe amputation later today. -Encouraged tobacco cessation.  Declined nicotine patch. -Continue aspirin and statin -Continue holding Eliquis.  Chronic COPD/tobacco use  disorder: Stable. -Continue breathing treatments -Encourage tobacco cessation  History of CAD/HTN -Started on aspirin as above.  Hypothyroidism: -Continue home Synthroid.  Anxiety/depression/chronic pain: Stable. -Continue home medications  Hypokalemia/hypomagnesemia: Resolved.   Severe malnutrition: As evidenced by significant muscle mass and subcu fat loss and low BMI Body mass index is 18.92 kg/m. Nutrition Problem: Moderate Malnutrition Etiology: chronic illness (COPD)-Consult dietitian. Signs/Symptoms: energy intake < or equal to 75% for > or equal to 1 month, mild fat depletion, moderate fat depletion, mild muscle depletion, moderate muscle depletion Interventions: Liberalize Diet, MVI, Juven, Magic cup   DVT prophylaxis:  Resume Eliquis after toe amputation  Code Status: Full code Family Communication: Patient and/or RN. Available if any question.  Level of care: Med-Surg Status is: Inpatient  Remains inpatient appropriate because: Critical limb ischemia and cellulitis and toe amputation       Consultants:  Podiatry Vascular surgery   Sch Meds:  Scheduled Meds:  aspirin EC  81 mg Oral Daily   atorvastatin  10 mg Oral Daily   buPROPion  150 mg Oral Daily   chlorhexidine  60 mL Topical Once   clopidogrel  75 mg Oral Daily   docusate sodium  100 mg Oral BID   escitalopram  10 mg Oral Q1500   ipratropium-albuterol  3 mL Nebulization TID   levothyroxine  88 mcg Oral Q0600   mometasone-formoterol  2 puff Inhalation BID   montelukast  10 mg Oral q1800   multivitamin with minerals  1 tablet Oral Daily   nutrition supplement (JUVEN)  1 packet Oral BID BM   povidone-iodine  2 application Topical Once   sodium chloride flush  3 mL Intravenous Q12H   traZODone  200 mg Oral QHS   Continuous Infusions:  piperacillin-tazobactam (ZOSYN)  IV 3.375 g (10/21/21 1450)   vancomycin Stopped (10/21/21 0027)   PRN Meds:.acetaminophen **OR** acetaminophen, albuterol,  ALPRAZolam, bisacodyl, hydrALAZINE, HYDROmorphone (DILAUDID) injection, nicotine, ondansetron **OR** ondansetron (ZOFRAN) IV, ondansetron (ZOFRAN) IV, oxyCODONE, polyethylene glycol  Antimicrobials: Anti-infectives (From admission, onward)    Start     Dose/Rate Route Frequency Ordered Stop   10/20/21 0720  ceFAZolin (ANCEF) IVPB 2g/100 mL premix  Status:  Discontinued       Note to Pharmacy: To be given in specials   2 g 200 mL/hr over 30 Minutes Intravenous 30 min pre-op 10/20/21 0715 10/20/21 1008   10/19/21 2300  vancomycin (VANCOREADY) IVPB 750 mg/150 mL        750 mg 150 mL/hr over 60 Minutes Intravenous Every 24 hours 10/19/21 0214     10/19/21 0600  piperacillin-tazobactam (ZOSYN) IVPB 3.375 g        3.375 g 12.5 mL/hr over 240 Minutes Intravenous Every 8 hours 10/19/21 0058     10/19/21 0100  piperacillin-tazobactam (ZOSYN)  IVPB 3.375 g  Status:  Discontinued        3.375 g 100 mL/hr over 30 Minutes Intravenous Every 6 hours 10/19/21 0054 10/19/21 0057   10/18/21 2230  piperacillin-tazobactam (ZOSYN) IVPB 3.375 g        3.375 g 100 mL/hr over 30 Minutes Intravenous  Once 10/18/21 2216 10/19/21 0040   10/18/21 2200  vancomycin (VANCOCIN) IVPB 1000 mg/200 mL premix        1,000 mg 200 mL/hr over 60 Minutes Intravenous  Once 10/18/21 2155 10/19/21 0009        I have personally reviewed the following labs and images: CBC: Recent Labs  Lab 10/16/21 0603 10/18/21 1508 10/19/21 0626  WBC 10.3 13.6* 8.7  NEUTROABS  --  9.8*  --   HGB 12.4 12.3 11.9*  HCT 37.6 38.0 37.4  MCV 87.4 88.6 87.8  PLT 461* 477* 342   BMP &GFR Recent Labs  Lab 10/16/21 0603 10/18/21 1508 10/19/21 0626 10/20/21 0401  NA 138 135 136 139  K 3.9 3.7 3.4* 3.7  CL 105 99 105 109  CO2 24 26 23 27   GLUCOSE 122* 103* 85 100*  BUN 16 12 11 14   CREATININE 0.63 0.61 0.61 0.52  CALCIUM 9.0 9.1 8.8* 8.2*  MG  --   --  1.7  --    Estimated Creatinine Clearance: 48.7 mL/min (by C-G formula based  on SCr of 0.52 mg/dL). Liver & Pancreas: Recent Labs  Lab 10/16/21 0603 10/18/21 1508  AST 25 27  ALT 50* 36  ALKPHOS 134* 109  BILITOT 0.7 0.7  PROT 6.4* 7.2  ALBUMIN 3.7 3.8   No results for input(s): LIPASE, AMYLASE in the last 168 hours. No results for input(s): AMMONIA in the last 168 hours. Diabetic: No results for input(s): HGBA1C in the last 72 hours. No results for input(s): GLUCAP in the last 168 hours. Cardiac Enzymes: No results for input(s): CKTOTAL, CKMB, CKMBINDEX, TROPONINI in the last 168 hours. No results for input(s): PROBNP in the last 8760 hours. Coagulation Profile: No results for input(s): INR, PROTIME in the last 168 hours. Thyroid Function Tests: No results for input(s): TSH, T4TOTAL, FREET4, T3FREE, THYROIDAB in the last 72 hours. Lipid Profile: No results for input(s): CHOL, HDL, LDLCALC, TRIG, CHOLHDL, LDLDIRECT in the last 72 hours. Anemia Panel: No results for input(s): VITAMINB12, FOLATE, FERRITIN, TIBC, IRON, RETICCTPCT in the last 72 hours. Urine analysis:    Component Value Date/Time   COLORURINE YELLOW 10/19/2021 0011   APPEARANCEUR CLEAR 10/19/2021 0011   LABSPEC <1.005 (L) 10/19/2021 0011   PHURINE 6.0 10/19/2021 0011   GLUCOSEU NEGATIVE 10/19/2021 0011   HGBUR NEGATIVE 10/19/2021 0011   BILIRUBINUR NEGATIVE 10/19/2021 0011   KETONESUR NEGATIVE 10/19/2021 0011   PROTEINUR NEGATIVE 10/19/2021 0011   NITRITE NEGATIVE 10/19/2021 0011   LEUKOCYTESUR SMALL (A) 10/19/2021 0011   Sepsis Labs: Invalid input(s): PROCALCITONIN, [place]  Microbiology: Recent Results (from the past 240 hour(s))  Blood culture (routine x 2)     Status: None (Preliminary result)   Collection Time: 10/18/21 10:42 PM   Specimen: BLOOD  Result Value Ref Range Status   Specimen Description BLOOD RIGHT ASSIST CONTROL  Final   Special Requests   Final    BOTTLES DRAWN AEROBIC AND ANAEROBIC Blood Culture adequate volume   Culture   Final    NO GROWTH 3  DAYS Performed at [facility], [address] [place], [place] [address]    Report Status PENDING  Incomplete  Blood culture (routine x 2)     Status: None (Preliminary result)   Collection Time: 10/18/21 10:47 PM   Specimen: BLOOD  Result Value Ref Range Status   Specimen Description BLOOD RIGHT FOREARM  Final   Special Requests   Final    BOTTLES DRAWN AEROBIC AND ANAEROBIC Blood Culture adequate volume   Culture   Final    NO GROWTH 3 DAYS Performed at [facility], [address] [place], [place] [address]    Report Status PENDING  Incomplete  Resp Panel by RT-PCR (Flu A&B, Covid) Nasopharyngeal Swab     Status: None   Collection Time: 10/18/21 10:57 PM   Specimen: Nasopharyngeal Swab; Nasopharyngeal(NP) swabs in vial transport medium  Result Value Ref Range Status   SARS Coronavirus 2 by RT PCR NEGATIVE NEGATIVE Final    Comment: (NOTE) SARS-CoV-2 target nucleic acids are NOT DETECTED.  The SARS-CoV-2 RNA is generally detectable in upper respiratory specimens during the acute phase of infection. The lowest concentration of SARS-CoV-2 viral copies this assay can detect is 138 copies/mL. A negative result does not preclude SARS-Cov-2 infection and should not be used as the sole basis for treatment or other patient management decisions. A negative result may occur with  improper specimen collection/handling, submission of specimen other than nasopharyngeal swab, presence of viral mutation(s) within the areas targeted by this assay, and inadequate number of viral copies(<138 copies/mL). A negative result must be combined with clinical observations, patient history, and epidemiological information. The expected result is Negative.  Fact Sheet for Patients:  EntrepreneurPulse.com.au  Fact Sheet for Healthcare Providers:  IncredibleEmployment.be  This test is no t yet approved or cleared by the [place] FDA and   has been authorized for detection and/or diagnosis of SARS-CoV-2 by FDA under an Emergency Use Authorization (EUA). This EUA will remain  in effect (meaning this test can be used) for the duration of the COVID-19 declaration under Section 564(b)(1) of the Act, 21 U.S.C.section 360bbb-3(b)(1), unless the authorization is terminated  or revoked sooner.       Influenza A by PCR NEGATIVE NEGATIVE Final   Influenza B by PCR NEGATIVE NEGATIVE Final    Comment: (NOTE) The Xpert Xpress SARS-CoV-2/FLU/RSV plus assay is intended as an aid in the diagnosis of influenza from Nasopharyngeal swab specimens and should not be used as a sole basis for treatment. Nasal washings and aspirates are unacceptable for Xpert Xpress SARS-CoV-2/FLU/RSV testing.  Fact Sheet for Patients: EntrepreneurPulse.com.au  Fact Sheet for Healthcare Providers: IncredibleEmployment.be  This test is not yet approved or cleared by the [place] FDA and has been authorized for detection and/or diagnosis of SARS-CoV-2 by FDA under an Emergency Use Authorization (EUA). This EUA will remain in effect (meaning this test can be used) for the duration of the COVID-19 declaration under Section 564(b)(1) of the Act, 21 U.S.C. section 360bbb-3(b)(1), unless the authorization is terminated or revoked.  Performed at [facility], [address], [place], [place] [address]   Surgical PCR screen     Status: None   Collection Time: 10/21/21 10:56 AM   Specimen: Nasal Mucosa; Nasal Swab  Result Value Ref Range Status   MRSA, PCR NEGATIVE NEGATIVE Final   Staphylococcus aureus NEGATIVE NEGATIVE Final    Comment: (NOTE) The Xpert SA Assay (FDA approved for NASAL specimens in patients 58 years of age and older), is one component of a comprehensive surveillance program. It is not intended to diagnose infection nor to  guide or monitor treatment. Performed at [facility],  [address], [place], [place] [address]     Radiology Studies: No results found.     Denay Pleitez T. [place]  If 7PM-7AM, please contact night-coverage [email or web address] 10/21/2021, 5:08 PM

## 2021-10-22 ENCOUNTER — Encounter: Payer: Self-pay | Admitting: Podiatry

## 2021-10-22 DIAGNOSIS — L03031 Cellulitis of right toe: Secondary | ICD-10-CM | POA: Diagnosis not present

## 2021-10-22 DIAGNOSIS — I70223 Atherosclerosis of native arteries of extremities with rest pain, bilateral legs: Secondary | ICD-10-CM | POA: Diagnosis not present

## 2021-10-22 DIAGNOSIS — F419 Anxiety disorder, unspecified: Secondary | ICD-10-CM | POA: Diagnosis not present

## 2021-10-22 DIAGNOSIS — R0989 Other specified symptoms and signs involving the circulatory and respiratory systems: Secondary | ICD-10-CM | POA: Diagnosis not present

## 2021-10-22 LAB — CBC
HCT: 30.7 % — ABNORMAL LOW (ref 36.0–46.0)
HCT: 33.9 % — ABNORMAL LOW (ref 36.0–46.0)
Hemoglobin: 9.6 g/dL — ABNORMAL LOW (ref 12.0–15.0)
Hemoglobin: 10.6 g/dL — ABNORMAL LOW (ref 12.0–15.0)
MCH: 28.4 pg (ref 26.0–34.0)
MCH: 28.2 pg (ref 26.0–34.0)
MCHC: 31.3 g/dL (ref 30.0–36.0)
MCHC: 31.3 g/dL (ref 30.0–36.0)
MCV: 90.8 fL (ref 80.0–100.0)
MCV: 90.2 fL (ref 80.0–100.0)
Platelets: 253 10*3/uL (ref 150–400)
Platelets: 300 10*3/uL (ref 150–400)
RBC: 3.38 MIL/uL — ABNORMAL LOW (ref 3.87–5.11)
RBC: 3.76 MIL/uL — ABNORMAL LOW (ref 3.87–5.11)
RDW: 15.9 % — ABNORMAL HIGH (ref 11.5–15.5)
RDW: 15.8 % — ABNORMAL HIGH (ref 11.5–15.5)
WBC: 8.5 10*3/uL (ref 4.0–10.5)
WBC: 11.7 10*3/uL — ABNORMAL HIGH (ref 4.0–10.5)
nRBC: 0 % (ref 0.0–0.2)
nRBC: 0 % (ref 0.0–0.2)

## 2021-10-22 LAB — RENAL FUNCTION PANEL
Albumin: 2.6 g/dL — ABNORMAL LOW (ref 3.5–5.0)
Anion gap: 4 — ABNORMAL LOW (ref 5–15)
BUN: 9 mg/dL (ref 8–23)
CO2: 26 mmol/L (ref 22–32)
Calcium: 8.4 mg/dL — ABNORMAL LOW (ref 8.9–10.3)
Chloride: 110 mmol/L (ref 98–111)
Creatinine, Ser: 0.6 mg/dL (ref 0.44–1.00)
GFR, Estimated: >60 mL/min (ref >60)
Glucose, Bld: 104 mg/dL — ABNORMAL HIGH (ref 70–99)
Phosphorus: 3.2 mg/dL (ref 2.5–4.6)
Potassium: 3.3 mmol/L — ABNORMAL LOW (ref 3.5–5.1)
Sodium: 140 mmol/L (ref 135–145)

## 2021-10-22 LAB — SEDIMENTATION RATE: Sed Rate: 17 mm/hr (ref 0–30)

## 2021-10-22 LAB — C-REACTIVE PROTEIN: CRP: 11 mg/dL — ABNORMAL HIGH (ref <1.0)

## 2021-10-22 LAB — MAGNESIUM: Magnesium: 1.7 mg/dL (ref 1.7–2.4)

## 2021-10-22 MED ORDER — OXYCODONE HCL 5 MG PO TABS
10.0000 mg | ORAL_TABLET | Freq: Three times a day (TID) | ORAL | Status: DC | PRN
  Administered 2021-10-22 – 2021-10-23 (×2): 10 mg via ORAL
  Filled 2021-10-22 (×2): qty 2

## 2021-10-22 MED ORDER — POTASSIUM CHLORIDE CRYS ER 20 MEQ PO TBCR
40.0000 meq | EXTENDED_RELEASE_TABLET | ORAL | Status: AC
  Administered 2021-10-22 (×2): 40 meq via ORAL
  Filled 2021-10-22 (×2): qty 2

## 2021-10-22 MED ORDER — ACETAMINOPHEN 500 MG PO TABS
1000.0000 mg | ORAL_TABLET | Freq: Three times a day (TID) | ORAL | Status: DC
  Administered 2021-10-22 – 2021-10-23 (×3): 1000 mg via ORAL
  Filled 2021-10-22 (×3): qty 2

## 2021-10-22 MED ORDER — COVID-19MRNA BIVAL VAC MODERNA 50 MCG/0.5ML IM SUSP
0.5000 mL | Freq: Once | INTRAMUSCULAR | Status: AC
  Administered 2021-10-23: 11:00:00 0.5 mL via INTRAMUSCULAR
  Filled 2021-10-22: qty 0.5

## 2021-10-22 MED ORDER — MAGNESIUM SULFATE 2 GM/50ML IV SOLN
2.0000 g | Freq: Once | INTRAVENOUS | Status: AC
  Administered 2021-10-22: 2 g via INTRAVENOUS
  Filled 2021-10-22: qty 50

## 2021-10-22 NOTE — Progress Notes (Signed)
   10/22/21 1251  Assess: MEWS Score  Temp 98.1 F (36.7 C)  BP 105/72  Pulse Rate (!) 102  Resp 18  SpO2 92 %  O2 Device Room Air  Assess: MEWS Score  MEWS Temp 0  MEWS Systolic 0  MEWS Pulse 1  MEWS RR 0  MEWS LOC 0  MEWS Score 1  MEWS Score Color Green  Assess: if the MEWS score is Yellow or Red  Were vital signs taken at a resting state? Yes  Focused Assessment No change from prior assessment  Does the patient meet 2 or more of the SIRS criteria? No  MEWS guidelines implemented *See Row Information* No, vital signs rechecked  Assess: SIRS CRITERIA  SIRS Temperature  0  SIRS Pulse 1  SIRS Respirations  0  SIRS WBC 0  SIRS Score Sum  1

## 2021-10-22 NOTE — Progress Notes (Signed)
PROGRESS NOTE  [name] UTM:[id number] DOB: [birth year]   PCP: Albina Billet, MD  Patient is from: Home.  DOA: 10/18/2021 LOS: 3  Chief complaints:  Chief Complaint  Patient presents with   Wound Infection     Brief Narrative / Interim history: 75 year old F with PMH of PVD s/p thrombectomy and stent, nonhealing right foot ulcer/gangrene, CAD, COPD, HTN, depression, hypothyroidism, chronic pain and tobacco use disorder sent to ED by podiatry for right foot ulceration and possible cellulitis.  Started on IV Zosyn.  Vascular surgery added IV vancomycin.  Patient underwent thrombectomy and treatment of previous stents with angioplasty balloon on 10/20/2021.  Plan for toe amputation by podiatry on 10/21/2021.  Likely home once reassessed and cleared by podiatry.  Infectious disease consulted for guidance on antibiotics.  Subjective: Seen and examined earlier this morning.  She is sleepy and complains about pain in her right foot when she wakes up.  Otherwise, she is oriented x4 except date but not alert.  Objective: Vitals:   10/21/21 2318 10/22/21 0246 10/22/21 0804 10/22/21 1214  BP: 123/67 113/61 129/69 101/63  Pulse: (!) 110 96 99 (!) 119  Resp: 18 (!) 22 18 20   Temp:  97.6 F (36.4 C) 98 F (36.7 C) 98 F (36.7 C)  TempSrc:      SpO2: 94% 93% 90% 91%  Weight:      Height:        Intake/Output Summary (Last 24 hours) at 10/22/2021 1232 Last data filed at 10/22/2021 1051 Gross per 24 hour  Intake 1060.69 ml  Output 300 ml  Net 760.69 ml   Filed Weights   10/18/21 1505  Weight: 50 kg    Examination:  GENERAL: Frail looking elderly female.  No apparent distress. HEENT: MMM.  Vision and hearing grossly intact.  NECK: Supple.  No apparent JVD.  RESP: 91% on RA.  No IWOB.  Fair aeration bilaterally. CVS:  RRR. Heart sounds normal.  ABD/GI/GU: BS+. Abd soft, NTND.  MSK/EXT:  Moves extremities.  Significant muscle mass and subcu fat loss.  Screaming when I took  off the bed sheet to look at her right foot.  SKIN: Bulky dressing over right foot.  No erythema or swelling proximally. NEURO: Sleepy but wakes to voice.  Oriented x4 except date.  No apparent focal neuro deficit. PSYCH: Calm.  No distress or agitation.  Procedures:  12/1-revascularization with thrombectomy and treatment of previous stent with PCA 12/2-right fourth toe partial amputation  Microbiology summarized: COVID-19 and influenza PCR nonreactive. Blood cultures NGTD.  Assessment & Plan: Right foot ulceration/gangrene/PAD s/p mechanical thrombectomy and stent angioplasty of the right lower extremity and third right toe amputation in 12/2020 and second right toe amputation in 04/2021. Possible right foot cellulitis: Abnormal soft tissue gas seen surrounding the fourth distal phalanx concerning for cellulitis. No definite lytic destruction is seen to suggest osteomyelitis -S/p revascularization with thrombectomy and treatment of previous stent with PCA on 12/1 -S/p partial right fourth toe amputation by podiatry on 12/2 -On IV Zosyn and IV vancomycin-ID consulted on 12/3. -Encouraged tobacco cessation.  Declined nicotine patch. -Continue aspirin and statin -Continue holding Eliquis in case she has to go back to the OR by podiatry -Scheduled Tylenol with as needed oxycodone for pain control. -Discontinue Dilaudid.  Screams in pain when she wakes up but falls back to sleep quickly.  Chronic COPD/tobacco use disorder: Stable. -Continue breathing treatments -Encourage tobacco cessation  History of CAD/HTN -Started on aspirin as above.  Hypothyroidism: -Continue home Synthroid.  Anxiety/depression/chronic pain: Stable. -Continue home medications  Hypokalemia/hypomagnesemia: K3.3.  Mg 1.7. -P.o. KCl 40x2 -IV magnesium sulfate 2 g x 1   Severe malnutrition: As evidenced by significant muscle mass and subcu fat loss and low BMI Body mass index is 18.92 kg/m. Nutrition Problem:  Moderate Malnutrition Etiology: chronic illness (COPD)-Consult dietitian. Signs/Symptoms: energy intake < or equal to 75% for > or equal to 1 month, mild fat depletion, moderate fat depletion, mild muscle depletion, moderate muscle depletion Interventions: Liberalize Diet, MVI, Juven, Magic cup   DVT prophylaxis:  Resume Eliquis after toe amputation  Code Status: Full code Family Communication: Patient and/or RN. Available if any question.  Level of care: Med-Surg Status is: Inpatient  Remains inpatient appropriate because: Critical limb ischemia and cellulitis and toe amputation       Consultants:  Podiatry Vascular surgery Infectious disease   Sch Meds:  Scheduled Meds:  acetaminophen  1,000 mg Oral Q8H   aspirin EC  81 mg Oral Daily   atorvastatin  10 mg Oral Daily   buPROPion  150 mg Oral Daily   clopidogrel  75 mg Oral Daily   [START ON 10/23/2021] COVID-19 mRNA bivalent vaccine (Moderna)  0.5 mL Intramuscular ONCE-1600   docusate sodium  100 mg Oral BID   escitalopram  10 mg Oral Q1500   ipratropium-albuterol  3 mL Nebulization TID   levothyroxine  88 mcg Oral Q0600   mometasone-formoterol  2 puff Inhalation BID   montelukast  10 mg Oral q1800   multivitamin with minerals  1 tablet Oral Daily   nutrition supplement (JUVEN)  1 packet Oral BID BM   sodium chloride flush  3 mL Intravenous Q12H   traZODone  200 mg Oral QHS   Continuous Infusions:  piperacillin-tazobactam (ZOSYN)  IV 3.375 g (10/22/21 0511)   vancomycin Stopped (10/22/21 0059)   PRN Meds:.albuterol, ALPRAZolam, bisacodyl, hydrALAZINE, nicotine, ondansetron **OR** ondansetron (ZOFRAN) IV, ondansetron (ZOFRAN) IV, oxyCODONE, polyethylene glycol  Antimicrobials: Anti-infectives (From admission, onward)    Start     Dose/Rate Route Frequency Ordered Stop   10/20/21 0720  ceFAZolin (ANCEF) IVPB 2g/100 mL premix  Status:  Discontinued       Note to Pharmacy: To be given in specials   2 g 200 mL/hr  over 30 Minutes Intravenous 30 min pre-op 10/20/21 0715 10/20/21 1008   10/19/21 2300  vancomycin (VANCOREADY) IVPB 750 mg/150 mL        750 mg 150 mL/hr over 60 Minutes Intravenous Every 24 hours 10/19/21 0214     10/19/21 0600  piperacillin-tazobactam (ZOSYN) IVPB 3.375 g        3.375 g 12.5 mL/hr over 240 Minutes Intravenous Every 8 hours 10/19/21 0058     10/19/21 0100  piperacillin-tazobactam (ZOSYN) IVPB 3.375 g  Status:  Discontinued        3.375 g 100 mL/hr over 30 Minutes Intravenous Every 6 hours 10/19/21 0054 10/19/21 0057   10/18/21 2230  piperacillin-tazobactam (ZOSYN) IVPB 3.375 g        3.375 g 100 mL/hr over 30 Minutes Intravenous  Once 10/18/21 2216 10/19/21 0040   10/18/21 2200  vancomycin (VANCOCIN) IVPB 1000 mg/200 mL premix        1,000 mg 200 mL/hr over 60 Minutes Intravenous  Once 10/18/21 2155 10/19/21 0009        I have personally reviewed the following labs and images: CBC: Recent Labs  Lab 10/16/21 0603 10/18/21 1508 10/19/21 0626 10/22/21 0459  10/22/21 1125  WBC 10.3 13.6* 8.7 8.5 11.7*  NEUTROABS  --  9.8*  --   --   --   HGB 12.4 12.3 11.9* 9.6* 10.6*  HCT 37.6 38.0 37.4 30.7* 33.9*  MCV 87.4 88.6 87.8 90.8 90.2  PLT 461* 477* 342 253 300   BMP &GFR Recent Labs  Lab 10/16/21 0603 10/18/21 1508 10/19/21 0626 10/20/21 0401 10/22/21 0459  NA 138 135 136 139 140  K 3.9 3.7 3.4* 3.7 3.3*  CL 105 99 105 109 110  CO2 24 26 23 27 26   GLUCOSE 122* 103* 85 100* 104*  BUN 16 12 11 14 9   CREATININE 0.63 0.61 0.61 0.52 0.60  CALCIUM 9.0 9.1 8.8* 8.2* 8.4*  MG  --   --  1.7  --  1.7  PHOS  --   --   --   --  3.2   Estimated Creatinine Clearance: 48.7 mL/min (by C-G formula based on SCr of 0.6 mg/dL). Liver & Pancreas: Recent Labs  Lab 10/16/21 0603 10/18/21 1508 10/22/21 0459  AST 25 27  --   ALT 50* 36  --   ALKPHOS 134* 109  --   BILITOT 0.7 0.7  --   PROT 6.4* 7.2  --   ALBUMIN 3.7 3.8 2.6*   No results for input(s): LIPASE,  AMYLASE in the last 168 hours. No results for input(s): AMMONIA in the last 168 hours. Diabetic: No results for input(s): HGBA1C in the last 72 hours. No results for input(s): GLUCAP in the last 168 hours. Cardiac Enzymes: No results for input(s): CKTOTAL, CKMB, CKMBINDEX, TROPONINI in the last 168 hours. No results for input(s): PROBNP in the last 8760 hours. Coagulation Profile: No results for input(s): INR, PROTIME in the last 168 hours. Thyroid Function Tests: No results for input(s): TSH, T4TOTAL, FREET4, T3FREE, THYROIDAB in the last 72 hours. Lipid Profile: No results for input(s): CHOL, HDL, LDLCALC, TRIG, CHOLHDL, LDLDIRECT in the last 72 hours. Anemia Panel: No results for input(s): VITAMINB12, FOLATE, FERRITIN, TIBC, IRON, RETICCTPCT in the last 72 hours. Urine analysis:    Component Value Date/Time   COLORURINE YELLOW 10/19/2021 0011   APPEARANCEUR CLEAR 10/19/2021 0011   LABSPEC <1.005 (L) 10/19/2021 0011   PHURINE 6.0 10/19/2021 0011   GLUCOSEU NEGATIVE 10/19/2021 0011   HGBUR NEGATIVE 10/19/2021 0011   BILIRUBINUR NEGATIVE 10/19/2021 0011   KETONESUR NEGATIVE 10/19/2021 0011   PROTEINUR NEGATIVE 10/19/2021 0011   NITRITE NEGATIVE 10/19/2021 0011   LEUKOCYTESUR SMALL (A) 10/19/2021 0011   Sepsis Labs: Invalid input(s): PROCALCITONIN, [place]  Microbiology: Recent Results (from the past 240 hour(s))  Blood culture (routine x 2)     Status: None (Preliminary result)   Collection Time: 10/18/21 10:42 PM   Specimen: BLOOD  Result Value Ref Range Status   Specimen Description BLOOD RIGHT ASSIST CONTROL  Final   Special Requests   Final    BOTTLES DRAWN AEROBIC AND ANAEROBIC Blood Culture adequate volume   Culture   Final    NO GROWTH 4 DAYS Performed at [facility], [address] [place], [place] [address]    Report Status PENDING  Incomplete  Blood culture (routine x 2)     Status: None (Preliminary result)   Collection Time: 10/18/21 10:47  PM   Specimen: BLOOD  Result Value Ref Range Status   Specimen Description BLOOD RIGHT FOREARM  Final   Special Requests   Final    BOTTLES DRAWN AEROBIC AND ANAEROBIC Blood  Culture adequate volume   Culture   Final    NO GROWTH 4 DAYS Performed at [facility], [address] [place], [place] [address]    Report Status PENDING  Incomplete  Resp Panel by RT-PCR (Flu A&B, Covid) Nasopharyngeal Swab     Status: None   Collection Time: 10/18/21 10:57 PM   Specimen: Nasopharyngeal Swab; Nasopharyngeal(NP) swabs in vial transport medium  Result Value Ref Range Status   SARS Coronavirus 2 by RT PCR NEGATIVE NEGATIVE Final    Comment: (NOTE) SARS-CoV-2 target nucleic acids are NOT DETECTED.  The SARS-CoV-2 RNA is generally detectable in upper respiratory specimens during the acute phase of infection. The lowest concentration of SARS-CoV-2 viral copies this assay can detect is 138 copies/mL. A negative result does not preclude SARS-Cov-2 infection and should not be used as the sole basis for treatment or other patient management decisions. A negative result may occur with  improper specimen collection/handling, submission of specimen other than nasopharyngeal swab, presence of viral mutation(s) within the areas targeted by this assay, and inadequate number of viral copies(<138 copies/mL). A negative result must be combined with clinical observations, patient history, and epidemiological information. The expected result is Negative.  Fact Sheet for Patients:  EntrepreneurPulse.com.au  Fact Sheet for Healthcare Providers:  IncredibleEmployment.be  This test is no t yet approved or cleared by the [place] FDA and  has been authorized for detection and/or diagnosis of SARS-CoV-2 by FDA under an Emergency Use Authorization (EUA). This EUA will remain  in effect (meaning this test can be used) for the duration of the COVID-19 declaration  under Section 564(b)(1) of the Act, 21 U.S.C.section 360bbb-3(b)(1), unless the authorization is terminated  or revoked sooner.       Influenza A by PCR NEGATIVE NEGATIVE Final   Influenza B by PCR NEGATIVE NEGATIVE Final    Comment: (NOTE) The Xpert Xpress SARS-CoV-2/FLU/RSV plus assay is intended as an aid in the diagnosis of influenza from Nasopharyngeal swab specimens and should not be used as a sole basis for treatment. Nasal washings and aspirates are unacceptable for Xpert Xpress SARS-CoV-2/FLU/RSV testing.  Fact Sheet for Patients: EntrepreneurPulse.com.au  Fact Sheet for Healthcare Providers: IncredibleEmployment.be  This test is not yet approved or cleared by the [place] FDA and has been authorized for detection and/or diagnosis of SARS-CoV-2 by FDA under an Emergency Use Authorization (EUA). This EUA will remain in effect (meaning this test can be used) for the duration of the COVID-19 declaration under Section 564(b)(1) of the Act, 21 U.S.C. section 360bbb-3(b)(1), unless the authorization is terminated or revoked.  Performed at [facility], [address] [place], [place] [address]   Surgical PCR screen     Status: None   Collection Time: 10/21/21 10:56 AM   Specimen: Nasal Mucosa; Nasal Swab  Result Value Ref Range Status   MRSA, PCR NEGATIVE NEGATIVE Final   Staphylococcus aureus NEGATIVE NEGATIVE Final    Comment: (NOTE) The Xpert SA Assay (FDA approved for NASAL specimens in patients 50 years of age and older), is one component of a comprehensive surveillance program. It is not intended to diagnose infection nor to guide or monitor treatment. Performed at [facility], [address] [place], [place]  [address]     Radiology Studies: No results found.     Antoinette Borgwardt T. [place]  If 7PM-7AM, please contact night-coverage [email or web address] 10/22/2021, 12:32 PM

## 2021-10-22 NOTE — Discharge Instructions (Signed)
Podiatry discharge instructions: 1.  Keep surgical dressings clean, dry, and intact until first postoperative visit.  If for whatever reason the dressings become saturated or loosens, okay to change dressing consisting of Betadine gauze to the incision site followed by 4 x 4 gauze, roll gauze, and Ace wrap.  Keep area of surgical site clean and intact otherwise could have worsening infection or wound healing issues.  If you are unable to come in for an appointment in around a week and at a facility you may have the dressing changed once a week or as needed for saturation or loosening as described above. 2.  Remain partial weightbearing to the surgical foot in surgical shoe.  Use walker aid for support as needed.  Try to stay off of the surgical foot as much as possible as this will decrease chances for wound healing issue. 3.  Elevate surgical extremity when at rest to decrease swelling and pain. 4.  Take antibiotics medication as prescribed until gone. 5.  If any other questions or concerns call clinic for instruction.  Follow-up within 1-2 week of your surgical date for dressing change and assessment of incision site. 6.  Contact pain management specialist for any pain medication need.  We will not be giving you pain medications postoperatively in our clinic and you will have to obtain this from your pain management specialist.

## 2021-10-22 NOTE — Progress Notes (Signed)
1 Day Post-Op   Subjective/Chief Complaint: Complains of right heel pain. She states her left groin site is tender.    Objective: Vital signs in last 24 hours: Temp:  [97.6 F (36.4 C)-98.6 F (37 C)] 98 F (36.7 C) (12/03 0804) Pulse Rate:  [95-110] 99 (12/03 0804) Resp:  [16-22] 18 (12/03 0804) BP: (113-146)/(61-70) 129/69 (12/03 0804) SpO2:  [88 %-100 %] 90 % (12/03 0804) Last BM Date: 10/20/21  Intake/Output from previous day: 12/02 0701 - 12/03 0700 In: 620.7 [P.O.:120; I.V.:250; IV Piggyback:250.7] Out: -  Intake/Output this shift: Total I/O In: 440 [P.O.:440] Out: 300 [Urine:300]  General appearance: alert and cooperative Resp: clear to auscultation bilaterally and normal percussion bilaterally Cardio: regular rate and rhythm, S1, S2 normal, no murmur, click, rub or gallop Incision/Wound: Left groin without hematoma, soft, mild tenderness to touch.  Lab Results:  Recent Labs    10/22/21 0459 10/22/21 1125  WBC 8.5 11.7*  HGB 9.6* 10.6*  HCT 30.7* 33.9*  PLT 253 300   BMET Recent Labs    10/20/21 0401 10/22/21 0459  NA 139 140  K 3.7 3.3*  CL 109 110  CO2 27 26  GLUCOSE 100* 104*  BUN 14 9  CREATININE 0.52 0.60  CALCIUM 8.2* 8.4*   PT/INR No results for input(s): LABPROT, INR in the last 72 hours. ABG No results for input(s): PHART, HCO3 in the last 72 hours.  Invalid input(s): PCO2, PO2  Studies/Results: No results found.  Anti-infectives: Anti-infectives (From admission, onward)    Start     Dose/Rate Route Frequency Ordered Stop   10/20/21 0720  ceFAZolin (ANCEF) IVPB 2g/100 mL premix  Status:  Discontinued       Note to Pharmacy: To be given in specials   2 g 200 mL/hr over 30 Minutes Intravenous 30 min pre-op 10/20/21 0715 10/20/21 1008   10/19/21 2300  vancomycin (VANCOREADY) IVPB 750 mg/150 mL        750 mg 150 mL/hr over 60 Minutes Intravenous Every 24 hours 10/19/21 0214     10/19/21 0600  piperacillin-tazobactam (ZOSYN)  IVPB 3.375 g        3.375 g 12.5 mL/hr over 240 Minutes Intravenous Every 8 hours 10/19/21 0058     10/19/21 0100  piperacillin-tazobactam (ZOSYN) IVPB 3.375 g  Status:  Discontinued        3.375 g 100 mL/hr over 30 Minutes Intravenous Every 6 hours 10/19/21 0054 10/19/21 0057   10/18/21 2230  piperacillin-tazobactam (ZOSYN) IVPB 3.375 g        3.375 g 100 mL/hr over 30 Minutes Intravenous  Once 10/18/21 2216 10/19/21 0040   10/18/21 2200  vancomycin (VANCOCIN) IVPB 1000 mg/200 mL premix        1,000 mg 200 mL/hr over 60 Minutes Intravenous  Once 10/18/21 2155 10/19/21 0009       Assessment/Plan: s/p Procedure(s): AMPUTATION TOE (Right) Continue with current care She will need follow-up with [facility] with new lower limb arterial duplex and ABI.  LOS: 3 days    Elmore Guise 10/22/2021

## 2021-10-22 NOTE — Evaluation (Addendum)
Physical Therapy Re-valuation Patient Details Name: [name] MRN: [id number] DOB: 24-Jun-1946 Today's Date: 10/22/2021  History of Present Illness  [name] is a 75 y.o. female seen in ed with complaints of presenting with infectious the fourth MTP.  Patient has seen Dr. Vickki Muff at [facility] who referred her here.  Patient presents with multiple chronic complaints.  Foot pain from several years back pain for several years states that she was seen by her podiatrist and was asked to come to the hospital states that she likely needs to have an amputation.  Pt has past medical history of COPD, CAD, peripheral vascular disease status post mechanical thrombectomy and stent angioplasty of the right lower extremity with gangrene of the right toes in the past. 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  Clinical Impression  Pt seen in handoff from OT. Pt initially pleasant & reports she's eager to d/c home. Pt engaged in conversation about her dog & willing to participate in PT. Pt requires CGA for all transfers (stand pivot & sit>stand) with cuing for safe hand placement. Pt is able to ambulate only 5 ft forwards + 5 ft backwards in room with CGA before endorsing fatigue & pain & need to sit. PT educated pt on need to negotiate stairs & ambulate increased distances to safely d/c home alone but pt becomes perseverative on receiving pain medication - nurse aware. At this time, recommend STR upon d/c to maximize independence with functional mobility & reduce fall risk prior to return home. Will continue to follow pt acutely & progress mobility as able. Anticipate pt can do more than she did today, and will encourage pt to attempt stair negotiation & increased gait to facilitate safe d/c home if possible.    Addendum: Updated pt's goals to reflect CLOF & anticipated assistance at d/c.      Recommendations for follow up therapy are one component of a multi-disciplinary discharge  planning process, led by the attending physician.  Recommendations may be updated based on patient status, additional functional criteria and insurance authorization.  Follow Up Recommendations Skilled nursing-short term rehab (<3 hours/day)    Assistance Recommended at Discharge Intermittent Supervision/Assistance  Functional Status Assessment Patient has had a recent decline in their functional status and demonstrates the ability to make significant improvements in function in a reasonable and predictable amount of time.  Equipment Recommendations  Rolling walker (2 wheels);BSC/3in1    Recommendations for Other Services       Precautions / Restrictions Precautions Precautions: Fall Restrictions Weight Bearing Restrictions: Yes RLE Weight Bearing: Weight bearing as tolerated Other Position/Activity Restrictions: Post-op shoe to R foot for OOB      Mobility  Bed Mobility Overal bed mobility: Modified Independent             General bed mobility comments: Not observed, pt received on BSC & left in recliner.    Transfers Overall transfer level: Needs assistance Equipment used: Rolling walker (2 wheels) Transfers: Bed to chair/wheelchair/BSC;Sit to/from Stand Sit to Stand: Min guard Stand pivot transfers: Min guard         General transfer comment: cuing for safe hand placement    Ambulation/Gait Ambulation/Gait assistance: Min guard Gait Distance (Feet): 10 Feet Assistive device: Rolling walker (2 wheels) Gait Pattern/deviations: Decreased dorsiflexion - right;Decreased step length - right;Decreased stride length;Decreased step length - left;Decreased dorsiflexion - left Gait velocity: decreased     General Gait Details: No c/o increased pain with  weight bearing through RLE but pt endorses fatigue after ambulating short distance in room.  Stairs            Wheelchair Mobility    Modified Rankin (Stroke Patients Only)       Balance Overall balance  assessment: Needs assistance Sitting-balance support: No upper extremity supported;Feet supported Sitting balance-Leahy Scale: Good Sitting balance - Comments: supervision static sitting   Standing balance support: Bilateral upper extremity supported;During functional activity Standing balance-Leahy Scale: Fair                               Pertinent Vitals/Pain Pain Assessment: Faces Pain Score: 9  Faces Pain Scale: Hurts worst Pain Location: R foot/ toe Pain Descriptors / Indicators: Sore;Discomfort Pain Intervention(s): Limited activity within patient's tolerance;Monitored during session;Repositioned;Patient requesting pain meds-RN notified (Pt would engage in conversation then would adamantly ask for pain medication, attempted to educate pt that she has been premedicated but pt with poor retention of information.)    Home Living Family/patient expects to be discharged to:: Private residence Living Arrangements: Alone Available Help at Discharge: Family;Personal care attendant;Available PRN/intermittently Type of Home: House Home Access: Stairs to enter Entrance Stairs-Rails: Left Entrance Stairs-Number of Steps: 5   Home Layout: One level Home Equipment: Conservation officer, nature (2 wheels);Rollator (4 wheels);Shower seat Additional Comments: Pt has friend that will be available to check in on her intermittently when pt returns home    Prior Function Prior Level of Function : Independent/Modified Independent       Physical Assist : Mobility (physical) Mobility (physical): Gait   Mobility Comments: Ambulatory with rollator. Lives with dog.       Hand Dominance        Extremity/Trunk Assessment   Upper Extremity Assessment Upper Extremity Assessment: Overall WFL for tasks assessed    Lower Extremity Assessment Lower Extremity Assessment: Generalized weakness RLE Deficits / Details: RLE not formally assessed as pt c/o pain, grossly 3+/5 as pt able to weight  bear without buckling noted. RLE: Unable to fully assess due to pain       Communication   Communication: HOH  Cognition Arousal/Alertness: Awake/alert Behavior During Therapy: WFL for tasks assessed/performed Overall Cognitive Status: Within Functional Limits for tasks assessed                                 General Comments: Pt AxOx4, pleasant when discussing dog Lexine Baton), but will revert back to perseverating on pain & need for pain medication with poor retention of PT educating her that she is premedicated & unable to receive any additional pain meds.        General Comments General comments (skin integrity, edema, etc.): Pt with continent void on [facility] & performs peri hygiene without assistance. PT assists with donning R post op shoe.    Exercises Other Exercises Other Exercises: OT ed with pt re: rationale of shoe, importance of OOB Activity   Assessment/Plan    PT Assessment Patient needs continued PT services  PT Problem List Decreased strength;Decreased activity tolerance;Decreased balance;Decreased mobility;Pain;Decreased safety awareness;Decreased knowledge of use of DME;Decreased skin integrity       PT Treatment Interventions Gait training;Functional mobility training;Therapeutic activities;Therapeutic exercise;Balance training;Neuromuscular re-education;Patient/family education;DME instruction;Stair training;Manual techniques;Modalities    PT Goals (Current goals can be found in the Care Plan section)  Acute Rehab PT Goals Patient Stated Goal: go home to  be with her dog PT Goal Formulation: With patient Time For Goal Achievement: 11/05/21 Potential to Achieve Goals: Good    Frequency Min 2X/week   Barriers to discharge Decreased caregiver support      Co-evaluation               AM-PAC PT "6 Clicks" Mobility  Outcome Measure Help needed turning from your back to your side while in a flat bed without using bedrails?: None Help needed  moving from lying on your back to sitting on the side of a flat bed without using bedrails?: None Help needed moving to and from a bed to a chair (including a wheelchair)?: A Little Help needed standing up from a chair using your arms (e.g., wheelchair or bedside chair)?: A Little Help needed to walk in hospital room?: A Little Help needed climbing 3-5 steps with a railing? : A Lot 6 Click Score: 19    End of Session Equipment Utilized During Treatment: Gait belt Activity Tolerance: Patient limited by pain;Patient limited by fatigue Patient left: in chair;with chair alarm set;with call bell/phone within reach Nurse Communication: Mobility status;Patient requests pain meds PT Visit Diagnosis: Unsteadiness on feet (R26.81);Muscle weakness (generalized) (M62.81);Difficulty in walking, not elsewhere classified (R26.2)    Time: 4627-0350 PT Time Calculation (min) (ACUTE ONLY): 23 min   Charges:   PT Evaluation $PT Eval Low Complexity: 1 Low PT Treatments $Therapeutic Activity: 8-22 mins        Lavone Nian, PT, DPT 10/22/21, 2:39 PM   [name] 10/22/2021, 2:26 PM

## 2021-10-22 NOTE — Progress Notes (Signed)
Pharmacy Antibiotic Note  [name] is a 75 y.o. female admitted on 10/18/2021 with osteomyelitis.  Pharmacy has been consulted for Vancomycin dosing.  -also on Zosyn -s/p toe amputation 12/2  Plan: Day 4- Vancomycin 750 mg IV Q 24 hrs.  Goal AUC 400-550. Expected AUC: 464.8 SCr used: 0.8 (11/29 Scr: 0.61) TBW 50 kg < IBW 54.7  F/u renal fxn on Zosyn/Vanc  Pharmacy will continue to follow and adjust dosing when warranted.   Height: 5\' 4"  (162.6 cm) Weight: 50 kg (110 lb 3.7 oz) IBW/kg (Calculated) : 54.7  Temp (24hrs), Avg:98.1 F (36.7 C), Min:97.6 F (36.4 C), Max:98.6 F (37 C)  Recent Labs  Lab 10/16/21 0603 10/18/21 1508 10/19/21 0626 10/20/21 0401 10/22/21 0459 10/22/21 1125  WBC 10.3 13.6* 8.7  --  8.5 11.7*  CREATININE 0.63 0.61 0.61 0.52 0.60  --   LATICACIDVEN  --  1.5 1.4  --   --   --      Estimated Creatinine Clearance: 48.7 mL/min (by C-G formula based on SCr of 0.6 mg/dL).    No Known Allergies  Antimicrobials this admission: 11/30 Zosyn >>  11/29 Vancomycin >>   Microbiology results: 11/29 BCx: NG 12/2 MRSA PCR neg  Thank you for allowing pharmacy to be a part of this patient's care.  Chinita Greenland PharmD Clinical Pharmacist 10/22/2021

## 2021-10-22 NOTE — Anesthesia Postprocedure Evaluation (Signed)
Anesthesia Post Note  Patient: [name]  Procedure(s) Performed: AMPUTATION TOE (Right: Toe)  Patient location during evaluation: PACU Anesthesia Type: General Level of consciousness: awake and alert Pain management: pain level controlled Vital Signs Assessment: post-procedure vital signs reviewed and stable Respiratory status: spontaneous breathing, nonlabored ventilation, respiratory function stable and patient connected to nasal cannula oxygen Cardiovascular status: blood pressure returned to baseline and stable Postop Assessment: no apparent nausea or vomiting Anesthetic complications: no   No notable events documented.   Last Vitals:  Vitals:   10/21/21 2318 10/22/21 0246  BP: 123/67 113/61  Pulse: (!) 110 96  Resp: 18 (!) 22  Temp:  36.4 C  SpO2: 94% 93%    Last Pain:  Vitals:   10/22/21 0532  TempSrc:   PainSc: Asleep                 [place] Ouch

## 2021-10-22 NOTE — Progress Notes (Signed)
   10/22/21 1623  Assess: MEWS Score  BP 124/75  Pulse Rate (!) 111  Resp 16  SpO2 93 %  O2 Device Room Air  Assess: MEWS Score  MEWS Temp 0  MEWS Systolic 0  MEWS Pulse 2  MEWS RR 0  MEWS LOC 0  MEWS Score 2  MEWS Score Color Yellow  Assess: if the MEWS score is Yellow or Red  Were vital signs taken at a resting state? Yes  Focused Assessment No change from prior assessment  Does the patient meet 2 or more of the SIRS criteria? No  MEWS guidelines implemented *See Row Information* No, vital signs rechecked  Treat  Pain Scale 0-10  Pain Score 6  Pain Type Chronic pain  Pain Location Foot  Pain Orientation Right  Take Vital Signs  Increase Vital Sign Frequency  Yellow: Q 2hr X 2 then Q 4hr X 2, if remains yellow, continue Q 4hrs  Escalate  MEWS: Escalate Yellow: discuss with charge nurse/RN and consider discussing with provider and RRT  Notify: Charge Nurse/RN  Name of Charge Nurse/RN Notified [place], RN  Date Charge Nurse/RN Notified 10/22/21  Notify: Provider  Provider Name/Title Dr. Cyndia Skeeters  Date Provider Notified 10/22/21  Time Provider Notified [phone]  Notification Type Page  Notification Reason  (protocol)  Assess: SIRS CRITERIA  SIRS Temperature  0  SIRS Pulse 1  SIRS Respirations  0  SIRS WBC 0  SIRS Score Sum  1

## 2021-10-22 NOTE — Evaluation (Signed)
Occupational Therapy Re-Evaluation Patient Details Name: [name] MRN: [id number] DOB: July 11, 1946 Today's Date: 10/22/2021   History of Present Illness [name] is a 75 y.o. female seen in ed with complaints of presenting with infectious the fourth MTP.  Patient has seen Dr. Vickki Muff at [facility] who referred her here.  Patient presents with multiple chronic complaints.  Foot pain from several years back pain for several years states that she was seen by her podiatrist and was asked to come to the hospital states that she likely needs to have an amputation.  Pt has past medical history of COPD, CAD, peripheral vascular disease status post mechanical thrombectomy and stent angioplasty of the right lower extremity with gangrene of the right toes in the past. 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   Clinical Impression   Pt seen for OT re-evaluation this date s/p R fourth toe amputation on 12/2. Pt presents with c/o pain, almost perseverating on medication, despite RN reporting she'd just been administered medication ~20 mins prior to OT session. Pt brings it up repeatedly throughout session although seemingly not having many adverse symptoms. Pt reports living alone at baseline and states that she has a friend who can help with meal preparation and other more difficult IADL tasks while pt recovers. That said, Pt currently requires MIN A/CGA for ADL transfers and MIN A for LB ADLs d/t pain. She demos decreased activity tolerance and requires increased time and encouragement to just come to EOB sitting and complete one SPS transfers with arm in arm to [facility]. At this time, d/t significantly decreased fxl activity tolerance, weakness and pain, do not anticipate pt can complete all basic ADLs safely in her home environment. Will continue to follow acutely. Anticipate she will require STR OT f/u services.      Recommendations for follow up therapy are one component of a  multi-disciplinary discharge planning process, led by the attending physician.  Recommendations may be updated based on patient status, additional functional criteria and insurance authorization.   Follow Up Recommendations  Skilled nursing-short term rehab (<3 hours/day)    Assistance Recommended at Discharge Intermittent Supervision/Assistance  Functional Status Assessment  Patient has had a recent decline in their functional status and demonstrates the ability to make significant improvements in function in a reasonable and predictable amount of time.  Equipment Recommendations       Recommendations for Other Services       Precautions / Restrictions Precautions Precautions: None Restrictions Weight Bearing Restrictions: No Other Position/Activity Restrictions: Post-op shoe to R foot for OOB      Mobility Bed Mobility Overal bed mobility: Modified Independent             General bed mobility comments: HOB slightly elevated, increased time    Transfers Overall transfer level: Needs assistance Equipment used: 1 person hand held assist Transfers: Bed to chair/wheelchair/BSC   Stand pivot transfers: Min guard;Min assist         General transfer comment: arm in arm to [facility], more so for comfort/confidence as pt reports she is fearful of getting dizzy and falling. She does not require much physical assitance, but also has very low tolerance      Balance Overall balance assessment: Needs assistance Sitting-balance support: No upper extremity supported;Feet supported Sitting balance-Leahy Scale: Good     Standing balance support: Bilateral upper extremity supported Standing balance-Leahy Scale: Fair  ADL either performed or assessed with clinical judgement   ADL Overall ADL's : Needs assistance/impaired                                       General ADL Comments: SETUP for seated UB ADLs, MIN A for seated LB  ADLs, CGA/MIN A for SPS ADL transfers on L LE as pt resluctant to put on post-op shoe     Vision Baseline Vision/History: 1 Wears glasses Patient Visual Report: No change from baseline       Perception     Praxis      Pertinent Vitals/Pain Pain Assessment: 0-10 Pain Score: 9  Pain Location: R foot/ toe Pain Descriptors / Indicators: Sore;Constant Pain Intervention(s): Limited activity within patient's tolerance;Monitored during session;Premedicated before session;Repositioned;Other (comment) (pt continually asks for pain medication throughout session despite RN reporting she'd recieved it jsut ~20 minutes prior to start of OT session)     Hand Dominance     Extremity/Trunk Assessment Upper Extremity Assessment Upper Extremity Assessment: Overall WFL for tasks assessed;Generalized weakness   Lower Extremity Assessment Lower Extremity Assessment: Defer to PT evaluation;RLE deficits/detail RLE Deficits / Details: RLE ROM WFL, MMT 4/5 with hip flexion, and abduciton, kne flex and extension, lmiited tolerance for ankle DF/PF this date, but still functional RLE: Unable to fully assess due to pain       Communication     Cognition Arousal/Alertness: Awake/alert Behavior During Therapy: WFL for tasks assessed/performed Overall Cognitive Status: Within Functional Limits for tasks assessed                                 General Comments: Able to follow all questions/commands. Oriented x4. perseverates on pain medication and meds in general including inhaler.     General Comments       Exercises Other Exercises Other Exercises: OT ed with pt re: rationale of shoe, importance of OOB Activity   Shoulder Instructions      Home Living Family/patient expects to be discharged to:: Private residence Living Arrangements: Alone Available Help at Discharge: Family;Personal care attendant;Available PRN/intermittently Type of Home: House Home Access: Stairs to  enter [employer] of Steps: 5 Entrance Stairs-Rails: Right Home Layout: One level     Bathroom Shower/Tub: Walk-in shower;Tub/shower unit   Bathroom Toilet: Standard     Home Equipment: Conservation officer, nature (2 wheels);Rollator (4 wheels);Shower seat   Additional Comments: Pt has friend that will be available to check in on her intermittently when pt returns home      Prior Functioning/Environment Prior Level of Function : Independent/Modified Independent       Physical Assist : Mobility (physical) Mobility (physical): Gait   Mobility Comments: Pt reports she was living independently with help of caregivers. REports she does ont get around much due to fear of falling. Ambulates with rollator as primary means of ambulation.          OT Problem List: Decreased strength;Decreased activity tolerance;Decreased knowledge of use of DME or AE;Pain      OT Treatment/Interventions: Self-care/ADL training;Therapeutic exercise;DME and/or AE instruction;Therapeutic activities;Patient/family education    OT Goals(Current goals can be found in the care plan section) Acute Rehab OT Goals Patient Stated Goal: to improve pain in R LE OT Goal Formulation: With patient Time For Goal Achievement: 11/05/21 Potential to Achieve Goals: Good ADL  Goals Pt Will Perform Grooming: with modified independence;standing Pt Will Perform Lower Body Dressing: with modified independence;sit to/from stand Pt Will Transfer to Toilet: with modified independence;grab bars;ambulating (with post op shoe and LRAD to restroom)  OT Frequency: Min 2X/week   Barriers to D/C:            Co-evaluation              AM-PAC OT "6 Clicks" Daily Activity     Outcome Measure Help from another person eating meals?: None Help from another person taking care of personal grooming?: A Little Help from another person toileting, which includes using toliet, bedpan, or urinal?: A Little Help from another person  bathing (including washing, rinsing, drying)?: A Little Help from another person to put on and taking off regular upper body clothing?: None Help from another person to put on and taking off regular lower body clothing?: A Little 6 Click Score: 20   End of Session Equipment Utilized During Treatment: Rolling walker (2 wheels);Oxygen Nurse Communication: Patient requests pain meds;Mobility status  Activity Tolerance: Patient limited by pain Patient left: in bed;with call bell/phone within reach;with bed alarm set  OT Visit Diagnosis: Unsteadiness on feet (R26.81);Muscle weakness (generalized) (M62.81)                Time: 7096-2836 OT Time Calculation (min): 21 min Charges:  OT General Charges $OT Visit: 1 Visit OT Evaluation $OT Re-eval: 1 Re-eval OT Treatments $Self Care/Home Management : 8-22 mins  Gerrianne Scale, MS, OTR/L ascom [phone] 10/22/21, 1:43 PM

## 2021-10-22 NOTE — Progress Notes (Signed)
PODIATRY / FOOT AND ANKLE SURGERY PROGRESS NOTE  Reason for consult: Right fourth toe infection  Chief Complaint: Right fourth toe pain/gangrene   HPI: [name] is a 75 y.o. female who presents status post 1 day right partial fourth toe amputation.  Patient still complaining of substantial pain to most areas of her body.  Patient states that most of the pain today that she is having at least to her right foot is to the heel area.  Patient is kept dressings clean, dry, and intact since procedure.  Patient resting in bed today comfortably upon arrival but as soon as I entered the room patient was talking about needing more pain medication.  Patient does have a history of chronic pain and does see pain management specialist outpatient.  PMHx:  Past Medical History:  Diagnosis Date   Anxiety    CAD (coronary artery disease)    Carpal tunnel syndrome    Cataract    Complication of anesthesia    has woken up at times   COPD (chronic obstructive pulmonary disease) (HCC)    CRP elevated 09/14/2015   Depression    Difficult intubation    Dyslipidemia    Dyspnea    DOE   Dysrhythmia    hx palpatations   Elevated sedimentation rate 09/14/2015   Esophageal spasm    Gastrointestinal parasites    GERD (gastroesophageal reflux disease)    Hiatal hernia    History of peptic ulcer disease    Hypertension    Hyperthyroidism    Hypothyroidism    Low magnesium levels 09/14/2015   Pelvic fracture ([place]) 2008   fall from riding a horse   Peripheral vascular disease (HCC)    Reflux    Rotator cuff injury    Stenosis, spinal, lumbar     Surgical Hx:  Past Surgical History:  Procedure Laterality Date   AMPUTATION TOE Right 12/26/2020   Procedure: AMPUTATION TOE-3rd Toes;  Surgeon: Samara Deist, DPM;  Location: [facility];  Service: Podiatry;  Laterality: Right;   AMPUTATION TOE Right 04/22/2021   Procedure: AMPUTATION TOE- RIGHT 2ND;  Surgeon: Samara Deist, DPM;  Location: [facility];   Service: Podiatry;  Laterality: Right;   AMPUTATION TOE Right 10/21/2021   Procedure: AMPUTATION TOE;  Surgeon: Caroline More, DPM;  Location: [facility];  Service: Podiatry;  Laterality: Right;   APPENDECTOMY     BACK SURGERY     CERVICAL FUSION   CARPAL TUNNEL RELEASE     CATARACT EXTRACTION W/PHACO Right 08/07/2017   Procedure: CATARACT EXTRACTION PHACO AND INTRAOCULAR LENS PLACEMENT ([place]);  Surgeon: Birder Robson, MD;  Location: [facility];  Service: Ophthalmology;  Laterality: Right;  [place] 00:52.0 AP% 16.8 CDE 8.74 Fluid Pack Lot # [id number]   CATARACT EXTRACTION W/PHACO Left 09/04/2017   Procedure: CATARACT EXTRACTION PHACO AND INTRAOCULAR LENS PLACEMENT ([place]);  Surgeon: Birder Robson, MD;  Location: [facility];  Service: Ophthalmology;  Laterality: Left;  [place] 00:34 AP% 17.0 CDE 5.80 Fluid pack lot # [id number]   CESAREAN SECTION     CHOLECYSTECTOMY     FRACTURE SURGERY     HIP SURGERY     INTRAMEDULLARY (IM) NAIL INTERTROCHANTERIC Right 10/01/2016   Procedure: INTRAMEDULLARY (IM) NAIL INTERTROCHANTRIC;  Surgeon: Corky Mull, MD;  Location: [facility];  Service: Orthopedics;  Laterality: Right;   INTRAMEDULLARY (IM) NAIL INTERTROCHANTERIC Left 01/14/2021   Procedure: INTRAMEDULLARY (IM) NAIL INTERTROCHANTRIC;  Surgeon: Lovell Sheehan, MD;  Location: [facility];  Service: Orthopedics;  Laterality:  Left;   KYPHOPLASTY N/A 08/20/2018   Procedure: [id number];  Surgeon: Hessie Knows, MD;  Location: [facility];  Service: Orthopedics;  Laterality: N/A;   LOWER EXTREMITY ANGIOGRAPHY Right 12/18/2019   Procedure: LOWER EXTREMITY ANGIOGRAPHY;  Surgeon: Algernon Huxley, MD;  Location: [place] [facility];  Service: Cardiovascular;  Laterality: Right;   LOWER EXTREMITY ANGIOGRAPHY Right 12/02/2020   Procedure: LOWER EXTREMITY ANGIOGRAPHY;  Surgeon: Algernon Huxley, MD;  Location: [place] [facility];  Service: Cardiovascular;  Laterality: Right;   LOWER EXTREMITY ANGIOGRAPHY Right 12/23/2020   Procedure:  Lower Extremity Angiography;  Surgeon: Algernon Huxley, MD;  Location: [place] [facility];  Service: Cardiovascular;  Laterality: Right;   LOWER EXTREMITY ANGIOGRAPHY Right 12/24/2020   Procedure: Lower Extremity Angiography;  Surgeon: Algernon Huxley, MD;  Location: [place] [facility];  Service: Cardiovascular;  Laterality: Right;   LOWER EXTREMITY ANGIOGRAPHY Right 10/20/2021   Procedure: Lower Extremity Angiography;  Surgeon: Algernon Huxley, MD;  Location: [place] [facility];  Service: Cardiovascular;  Laterality: Right;   NECK SURGERY     NOSE SURGERY     ROTATOR CUFF REPAIR     x2   SHOULDER ARTHROSCOPY  12/07/2011   Procedure: ARTHROSCOPY SHOULDER;  Surgeon: Ninetta Lights, MD;  Location: [place];  Service: Orthopedics;  Laterality: Right;  Debridement Partial Cuff Tear, Release Coracoacromial Ligament   SHOULDER SURGERY  12/07/2011   right    FHx:  Family History  Problem Relation Age of Onset   Aneurysm Other     Social History:  reports that she has been smoking cigarettes. She has never used smokeless tobacco. She reports that she does not drink alcohol and does not use drugs.  Allergies: No Known Allergies  Medications Prior to Admission  Medication Sig Dispense Refill   ALPRAZolam (XANAX) 1 MG tablet Take 1 mg by mouth 3 (three) times daily as needed.     amLODipine (NORVASC) 5 MG tablet Take 5 mg by mouth daily.     aspirin EC 81 MG tablet Take 1 tablet (81 mg total) by mouth daily. 150 tablet 2   buPROPion (WELLBUTRIN SR) 150 MG 12 hr tablet Take 150 mg by mouth daily.      clopidogrel (PLAVIX) 75 MG tablet Take 75 mg by mouth daily.     COMBIVENT RESPIMAT 20-100 MCG/ACT AERS respimat Inhale 1-2 puffs into the lungs 4 (four) times daily. 4 g 1   escitalopram (LEXAPRO) 10 MG tablet Take 10 mg by mouth daily in the afternoon.     Fluticasone-Salmeterol (ADVAIR DISKUS) 250-50 MCG/DOSE AEPB Inhale 1 puff into the lungs 2 (two) times daily. 60 each 0    levothyroxine (SYNTHROID, LEVOTHROID) 88 MCG tablet Take 88 mcg by mouth daily before breakfast.      montelukast (SINGULAIR) 10 MG tablet Take 10 mg by mouth daily.     oxyCODONE (OXY IR/ROXICODONE) 5 MG immediate release tablet Take 1 tablet (5 mg total) by mouth 2 (two) times daily as needed for severe pain. Must last 30 days 60 tablet 0   [EXPIRED] predniSONE (DELTASONE) 50 MG tablet Take 1 tablet (50 mg total) by mouth daily with breakfast for 3 days. 3 tablet 0   traZODone (DESYREL) 150 MG tablet Take 150-300 mg by mouth at bedtime as needed.     albuterol (PROVENTIL) (2.5 MG/3ML) 0.083% nebulizer solution Take 3 mLs (2.5 mg total) by nebulization every 4 (four) hours as needed for wheezing or  shortness of breath. 75 mL 2   albuterol (VENTOLIN HFA) 108 (90 Base) MCG/ACT inhaler Inhale 2 puffs into the lungs every 6 (six) hours as needed for wheezing or shortness of breath. 8 g 2   ALPRAZolam (XANAX) 0.5 MG tablet Take 1 tablet (0.5 mg total) by mouth every 6 (six) hours as needed for anxiety. (Patient not taking: Reported on 10/19/2021) 12 tablet 0   apixaban (ELIQUIS) 5 MG TABS tablet Take 1 tablet (5 mg total) by mouth 2 (two) times daily. 60 tablet 1   atorvastatin (LIPITOR) 10 MG tablet Take 1 tablet (10 mg total) by mouth daily. 30 tablet 11   tizanidine (ZANAFLEX) 2 MG capsule Take 1 capsule (2 mg total) by mouth 3 (three) times daily as needed for muscle spasms. 90 capsule 2    Physical Exam: General: Alert and oriented.  No apparent distress.  Constantly discussing pain and how she needs further pain medication.  Vascular: DP/PT pulses nonpalpable bilateral.  No hair growth noted to digits.  Mild edema present to bilateral lower extremities.  Neuro: Light touch sensation mildly reduced to bilateral lower extremities.  Derm: Incision site to the right fourth toe partial amputation appears to be well coapted with sutures intact, skin edges appear to be more viable today with some  pinking of the skin, minimal to no drainage noted overall.    Small area around the lateral heel/fibular area consistent with a stable eschar, no drainage, no signs of infection present.     MSK: Right multiple digital amputations including second toe, third toe, partial fourth toe.  Results for orders placed or performed during the hospital encounter of 10/18/21 (from the past 48 hour(s))  Surgical PCR screen     Status: None   Collection Time: 10/21/21 10:56 AM   Specimen: Nasal Mucosa; Nasal Swab  Result Value Ref Range   MRSA, PCR NEGATIVE NEGATIVE   Staphylococcus aureus NEGATIVE NEGATIVE    Comment: (NOTE) The Xpert SA Assay (FDA approved for NASAL specimens in patients 75 years of age and older), is one component of a comprehensive surveillance program. It is not intended to diagnose infection nor to guide or monitor treatment. Performed at [facility], [address] [place], [place] [address]   Renal function panel     Status: Abnormal   Collection Time: 10/22/21  4:59 AM  Result Value Ref Range   Sodium 140 135 - 145 mmol/L   Potassium 3.3 (L) 3.5 - 5.1 mmol/L   Chloride 110 98 - 111 mmol/L   CO2 26 22 - 32 mmol/L   Glucose, Bld 104 (H) 70 - 99 mg/dL    Comment: Glucose reference range applies only to samples taken after fasting for at least 8 hours.   BUN 9 8 - 23 mg/dL   Creatinine, Ser 0.60 0.44 - 1.00 mg/dL   Calcium 8.4 (L) 8.9 - 10.3 mg/dL   Phosphorus 3.2 2.5 - 4.6 mg/dL   Albumin 2.6 (L) 3.5 - 5.0 g/dL   GFR, Estimated >60 >60 mL/min    Comment: (NOTE) Calculated using the CKD-EPI Creatinine Equation (2021)    Anion gap 4 (L) 5 - 15    Comment: Performed at [facility], [address] [place], [place] [address]  Magnesium     Status: None   Collection Time: 10/22/21  4:59 AM  Result Value Ref Range   Magnesium 1.7 1.7 - 2.4 mg/dL    Comment: Performed at [facility], [address] [place], [place] [address]  CBC      Status: Abnormal   Collection Time: 10/22/21  4:59 AM  Result Value Ref Range   WBC 8.5 4.0 - 10.5 K/uL   RBC 3.38 (L) 3.87 - 5.11 MIL/uL   Hemoglobin 9.6 (L) 12.0 - 15.0 g/dL   HCT 30.7 (L) 36.0 - 46.0 %   MCV 90.8 80.0 - 100.0 fL   MCH 28.4 26.0 - 34.0 pg   MCHC 31.3 30.0 - 36.0 g/dL   RDW 15.9 (H) 11.5 - 15.5 %   Platelets 253 150 - 400 K/uL   nRBC 0.0 0.0 - 0.2 %    Comment: Performed at [facility], [address] [place], [place] [address]  CBC     Status: Abnormal   Collection Time: 10/22/21 11:25 AM  Result Value Ref Range   WBC 11.7 (H) 4.0 - 10.5 K/uL   RBC 3.76 (L) 3.87 - 5.11 MIL/uL   Hemoglobin 10.6 (L) 12.0 - 15.0 g/dL   HCT 33.9 (L) 36.0 - 46.0 %   MCV 90.2 80.0 - 100.0 fL   MCH 28.2 26.0 - 34.0 pg   MCHC 31.3 30.0 - 36.0 g/dL   RDW 15.8 (H) 11.5 - 15.5 %   Platelets 300 150 - 400 K/uL   nRBC 0.0 0.0 - 0.2 %    Comment: Performed at [facility], [address] [place], [place] [address]  Sedimentation rate     Status: None   Collection Time: 10/22/21 11:25 AM  Result Value Ref Range   Sed Rate 17 0 - 30 mm/hr    Comment: Performed at [facility], [address], [place],  [address]   No results found.  Blood pressure 105/72, pulse (!) 102, temperature 98.1 F (36.7 C), resp. rate 18, height 5\' 4"  (1.626 m), weight 50 kg, SpO2 90 %.  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 -Incision site appears to be well coapted with sutures intact, skin edges appear to be more viable today compared to immediate postop.  No signs of infection present overall. -Discussed eschar present to the right lateral heel, no signs of infection, no debridement indicated. -Redressed today with Xeroform to the incision line and Betadine paint eschar followed by 4 x 4 gauze, Kerlix, Ace wrap.  Patient instructed to keep dressings clean, dry,  and intact.  Patient may wear this dressing for a week prior to having it changed again.  Would like to have patient follow-up in clinic within 1 week of surgical date for further assessment and dressing change.  Of unable to do so will leave recommendations for dressing changes for skilled nursing and patient can follow-up in 2 to 3 weeks. -Patient may be weightbearing as tolerated in surgical shoe to the right foot.  Appreciate PT/OT recommendations.  Recommending skilled nursing facility. -Appreciate medicine recommendations for pain management.  Patient has pain management specialist in the outpatient setting. -Likely can be discharged on oral antibiotics upon discharge.  Recommend doxycycline 7-day course.  Podiatry team to monitor more peripherally at this time.  From podiatry standpoint when medically stable may be discharged.  Follow-up instructions placed in chart.  Caroline More, DPM 10/22/2021, 3:08 PM

## 2021-10-23 DIAGNOSIS — L03031 Cellulitis of right toe: Secondary | ICD-10-CM | POA: Diagnosis not present

## 2021-10-23 LAB — CULTURE, BLOOD (ROUTINE X 2)
Culture: NO GROWTH
Culture: NO GROWTH
Special Requests: ADEQUATE
Special Requests: ADEQUATE

## 2021-10-23 LAB — CREATININE, SERUM
Creatinine, Ser: 0.57 mg/dL (ref 0.44–1.00)
GFR, Estimated: >60 mL/min (ref >60)

## 2021-10-23 MED ORDER — OXYCODONE HCL 10 MG PO TABS: 10.0000 mg | ORAL_TABLET | Freq: Four times a day (QID) | ORAL | 0 refills | Status: DC | PRN

## 2021-10-23 MED ORDER — DOXYCYCLINE HYCLATE 100 MG PO TABS: 100.0000 mg | ORAL_TABLET | Freq: Two times a day (BID) | ORAL | 0 refills | Status: AC

## 2021-10-23 NOTE — Progress Notes (Signed)
Patient and family was given verbal and written discharge instruction, she acknowledge understanding and states she will comply, patient was wheeled out to car, No distress noted when leaving the floor.

## 2021-10-23 NOTE — TOC Progression Note (Addendum)
Transition of Care [facility]) - Progression Note    Patient Details  Name: [name] MRN: [id number] Date of Birth: 07-26-1946  Transition of Care [facility]) CM/SW [place], RN Phone Number: 10/23/2021, 11:55 AM  Clinical Narrative:   Patient has as friend that comes in to stay with her and helps with care.  Bertell Maria will take her to appointments and pharmacy,  Yolanda Bonine takes her to grocery shopping.  Patient refuses SNF, would like to go home with home health.  Contacted [facility] for home health, Judson Roch will get back to me.  Patient has walker and bsc at home, does not need any DME. Patient states she has no further concerns at home at this time.    Addendum:  [facility] has accepted patient under their care.  Patient does not voice any further TOC needs at the time of discharge.       Expected Discharge Plan and Services           Expected Discharge Date: 10/23/21                                     Social Determinants of Health (SDOH) Interventions    Readmission Risk Interventions Readmission Risk Prevention Plan 01/14/2021  PCP or Specialist Appt within 3-5 Days Complete  HRI or [place] Complete  Social Work Consult for [place] Planning/Counseling Complete  Palliative Care Screening Not Applicable  Medication Review [employer]) Complete  Some recent data might be hidden

## 2021-10-23 NOTE — Discharge Summary (Signed)
Physician Discharge Summary  [name] RSW:[id number] DOB: 02-18-1946 DOA: 10/18/2021  PCP: Albina Billet, MD  Admit date: 10/18/2021 Discharge date: 10/23/2021  Admitted From: Home Disposition: Home with home health  Recommendations for Outpatient Follow-up:  Follow up with PCP in 1-2 weeks Please obtain BMP/CBC in one week Follow-up with podiatry surgery and vascular surgery as scheduled  Home Health: PT/OT Equipment/Devices: None  Discharge Condition: Fair CODE STATUS: Full code Diet recommendation: Low-salt low-carb diet  Discharge summary: 75 year old F with PMH of PVD s/p thrombectomy and stent, nonhealing right foot ulcer/gangrene, CAD, COPD, HTN, depression, hypothyroidism, chronic pain and tobacco use disorder sent to ED by podiatry for right foot ulceration and possible cellulitis.  Started on IV Zosyn.  Vascular surgery added IV vancomycin. Patient underwent thrombectomy and treatment of previous stents with angioplasty balloon on 10/20/2021.  Partial toe amputation by podiatry on 10/21/2021.    Right foot ulceration with gangrene in a patient with peripheral arterial disease: Status post mechanical thrombectomy and stent angioplasty, right third toe partial amputation 2/22, secondary to amputation 6/22.  Admitted with broad-spectrum antibiotics and underwent partial right fourth toe amputation by podiatry on 10/21/2021. Blood cultures negative.  Local cultures not available. 12/3, wound bed examined by podiatry surgery.  Reported surgical resection to clean margin with no evidence of remaining infection or osteomyelitis.  With negative blood cultures, no need to continue IV antibiotics when source of infection is all removed and amputated.  As per the recommendation, will discharge with 7 days of doxycycline.  Severe peripheral vascular disease, patient on aspirin and Eliquis that we will continue.  She has history of coronary artery disease.  Patient is apparently on  aspirin, Plavix and Eliquis.  Likely side effects will outweigh benefits of all 3 medications in combination causing bleeding, I will defer this to her vascular surgery to reevaluate about terminations of dual antiplatelet therapy and anticoagulation.  We will continue today.  Patient also has generalized body ache and chronic pain condition.  She is on chronic pain management and also on Xanax.  Patient does have future prescriptions for oxycodone 5 mg to take twice daily from her clinic.  Given her acute surgical intervention and ongoing need for pain management at the amputated toe, prescribed oxycodone 10 mg every 6 hours as needed for 5 days, quantity 20.  She will resume her long-term pain management after this regimen.  Multiple chronic medical issues remained stable.  She will resume all her home medications including Synthroid, statin.  Electrolytes were replaced and adequate.  Discharge disposition: Lives alone.  She has a friend who helps her.  Seen by PT OT.  Recommended skilled nursing facility.  Adamantly denies and declines to go to a facility.  She is agreeable to home health PT OT.  She is asking to be discharged today.  She has a dog to take care of and she has a friend who will come over and help her.  Since patient is declining to go to SNF and wanting to go home.  Discharged with home health PT OT.   Discharge Diagnoses:  Principal Problem:   Cellulitis Active Problems:   Hypothyroidism   Anxiety and depression   Hypomagnesemia   Hypokalemia   Absent pedal pulses   Malnutrition of moderate degree    Discharge Instructions  Discharge Instructions     Call MD for:  redness, tenderness, or signs of infection (pain, swelling, redness, odor or green/yellow discharge around incision site)  Complete by: As directed    Call MD for:  severe uncontrolled pain   Complete by: As directed    Call MD for:  temperature >100.4   Complete by: As directed    Diet - low sodium  heart healthy   Complete by: As directed    Discharge instructions   Complete by: As directed    Use new dose of pain medicine for next 5 days and go back to your long term pain management with oxycodone   Increase activity slowly   Complete by: As directed    Leave dressing on - Keep it clean, dry, and intact until clinic visit   Complete by: As directed       Allergies as of 10/23/2021   No Known Allergies      Medication List     STOP taking these medications    predniSONE 50 MG tablet Commonly known as: DELTASONE       TAKE these medications    albuterol 108 (90 Base) MCG/ACT inhaler Commonly known as: VENTOLIN HFA Inhale 2 puffs into the lungs every 6 (six) hours as needed for wheezing or shortness of breath.   albuterol (2.5 MG/3ML) 0.083% nebulizer solution Commonly known as: PROVENTIL Take 3 mLs (2.5 mg total) by nebulization every 4 (four) hours as needed for wheezing or shortness of breath.   ALPRAZolam 1 MG tablet Commonly known as: XANAX Take 1 mg by mouth 3 (three) times daily as needed. What changed: Another medication with the same name was removed. Continue taking this medication, and follow the directions you see here.   amLODipine 5 MG tablet Commonly known as: NORVASC Take 5 mg by mouth daily.   apixaban 5 MG Tabs tablet Commonly known as: ELIQUIS Take 1 tablet (5 mg total) by mouth 2 (two) times daily.   aspirin EC 81 MG tablet Take 1 tablet (81 mg total) by mouth daily.   atorvastatin 10 MG tablet Commonly known as: Lipitor Take 1 tablet (10 mg total) by mouth daily.   buPROPion 150 MG 12 hr tablet Commonly known as: WELLBUTRIN SR Take 150 mg by mouth daily.   clopidogrel 75 MG tablet Commonly known as: PLAVIX Take 75 mg by mouth daily.   Combivent Respimat 20-100 MCG/ACT Aers respimat Generic drug: Ipratropium-Albuterol Inhale 1-2 puffs into the lungs 4 (four) times daily.   escitalopram 10 MG tablet Commonly known as:  LEXAPRO Take 10 mg by mouth daily in the afternoon.   Fluticasone-Salmeterol 250-50 MCG/DOSE Aepb Commonly known as: Advair Diskus Inhale 1 puff into the lungs 2 (two) times daily.   levothyroxine 88 MCG tablet Commonly known as: SYNTHROID Take 88 mcg by mouth daily before breakfast.   montelukast 10 MG tablet Commonly known as: SINGULAIR Take 10 mg by mouth daily.   oxyCODONE 5 MG immediate release tablet Commonly known as: Oxy IR/ROXICODONE Take 1 tablet (5 mg total) by mouth 2 (two) times daily as needed for severe pain. Must last 30 days What changed: Another medication with the same name was added. Make sure you understand how and when to take each.   Oxycodone HCl 10 MG Tabs Take 1 tablet (10 mg total) by mouth every 6 (six) hours as needed for up to 5 days for severe pain or breakthrough pain. What changed: You were already taking a medication with the same name, and this prescription was added. Make sure you understand how and when to take each.   tizanidine 2 MG capsule Commonly  known as: ZANAFLEX Take 1 capsule (2 mg total) by mouth 3 (three) times daily as needed for muscle spasms.   traZODone 150 MG tablet Commonly known as: DESYREL Take 150-300 mg by mouth at bedtime as needed.               Discharge Care Instructions  (From admission, onward)           Start     Ordered   10/23/21 0000  Leave dressing on - Keep it clean, dry, and intact until clinic visit        10/23/21 0839            Follow-up Information     Caroline More, DPM. Schedule an appointment as soon as possible for a visit in 1 week(s).   Specialty: Podiatry Contact information: [place] [address] [phone]         Algernon Huxley, MD. Schedule an appointment as soon as possible for a visit in 1 month(s).   Specialties: Vascular Surgery, Radiology, Interventional Cardiology Contact information: [place]  [address] [phone]                No Known Allergies  Consultations: Podiatry 's vascular surgery   Procedures/Studies: DG Chest 2 View  Result Date: 10/18/2021 CLINICAL DATA:  Infection EXAM: CHEST - 2 VIEW COMPARISON:  Chest x-ray dated October 16, 2021 FINDINGS: Cardiac and mediastinal contours are unchanged and within normal limits. Trace bilateral pleural effusions. Background emphysema. Mild reticular opacities at the right greater than left lung bases, possibly due to scarring. No focal consolidation. No evidence of pneumothorax. IMPRESSION: Trace bilateral pleural effusions.  No focal consolidation. Electronically Signed   By: Yetta Glassman M.D.   On: 10/18/2021 15:50   DG Chest 2 View  Result Date: 10/16/2021 CLINICAL DATA:  75 year old female with history of chest pain and shortness of breath. EXAM: CHEST - 2 VIEW COMPARISON:  Chest x-ray 03/21/2021. FINDINGS: Lung volumes are increased with advanced emphysematous changes. No consolidative airspace disease. No pleural effusions. No pneumothorax. No pulmonary nodule or mass noted. Pulmonary vasculature and the cardiomediastinal silhouette are within normal limits. Atherosclerosis in the thoracic aorta. Orthopedic fixation hardware in the lower cervical spine incidentally noted. IMPRESSION: 1.  No radiographic evidence of acute cardiopulmonary disease. 2. Emphysema. 3. Aortic atherosclerosis. Electronically Signed   By: Vinnie Langton M.D.   On: 10/16/2021 06:27   PERIPHERAL VASCULAR CATHETERIZATION  Result Date: 10/20/2021 See surgical note for result.  DG Foot Complete Right  Result Date: 10/18/2021 CLINICAL DATA:  Right foot gangrene. EXAM: RIGHT FOOT COMPLETE - 3+ VIEW COMPARISON:  December 22, 2020. FINDINGS: Status post surgical amputation of the second and third toes. No fracture or dislocation is noted. Abnormal soft tissue gas is seen around the fourth distal phalanx concerning for cellulitis. No definite  lytic destruction is seen to suggest osteomyelitis. IMPRESSION: Postsurgical changes as described above. Abnormal soft tissue gas seen surrounding the fourth distal phalanx concerning for cellulitis. No definite lytic destruction is seen to suggest osteomyelitis. Electronically Signed   By: Marijo Conception M.D.   On: 10/18/2021 15:51   (Echo, Carotid, EGD, Colonoscopy, ERCP)    Subjective: Patient seen and examined.  She was asking for pain medications most of the time. Complains of pain on the right toe. She is very hard of hearing.  Discussed about referral to rehab and she has negative experiences at rehab so she does not  want to go.  She tells me that she feels that she has to work little harder but she can manage to go home.  She has a friend who will come over all day to help her.  We discussed about opiate prescriptions and I prescribed#20 oxycodone and explained to her.   Discharge Exam: Vitals:   10/23/21 0451 10/23/21 0806  BP: 123/67 113/76  Pulse: (!) 110 (!) 108  Resp: 20 20  Temp: 97.9 F (36.6 C) 97.9 F (36.6 C)  SpO2: 97% 97%   Vitals:   10/22/21 2043 10/22/21 2057 10/23/21 0451 10/23/21 0806  BP: 126/69  123/67 113/76  Pulse: 98  (!) 110 (!) 108  Resp: 20  20 20   Temp: 98.4 F (36.9 C)  97.9 F (36.6 C) 97.9 F (36.6 C)  TempSrc:      SpO2: 97% 97% 97% 97%  Weight:      Height:        General: Pt is alert, awake, not in acute distress Frail and debilitated.  Chronically sick looking but not in any distress.  Hard of hearing. Cardiovascular: RRR, S1/S2 +, no rubs, no gallops Respiratory: CTA bilaterally, no wheezing, no rhonchi Abdominal: Soft, NT, ND, bowel sounds + Extremities:   Surgical dressing over the right foot.  No erythema or swelling proximally.  On examination, note from Dr. Luana Shu 12/3 advised to leave dressing on until follow-up.    The results of significant diagnostics from this hospitalization (including imaging, microbiology,  ancillary and laboratory) are listed below for reference.     Microbiology: Recent Results (from the past 240 hour(s))  Blood culture (routine x 2)     Status: None   Collection Time: 10/18/21 10:42 PM   Specimen: BLOOD  Result Value Ref Range Status   Specimen Description BLOOD RIGHT ASSIST CONTROL  Final   Special Requests   Final    BOTTLES DRAWN AEROBIC AND ANAEROBIC Blood Culture adequate volume   Culture   Final    NO GROWTH 5 DAYS Performed at [facility], [address], [place], [place] [address]    Report Status 10/23/2021 FINAL  Final  Blood culture (routine x 2)     Status: None   Collection Time: 10/18/21 10:47 PM   Specimen: BLOOD  Result Value Ref Range Status   Specimen Description BLOOD RIGHT FOREARM  Final   Special Requests   Final    BOTTLES DRAWN AEROBIC AND ANAEROBIC Blood Culture adequate volume   Culture   Final    NO GROWTH 5 DAYS Performed at [facility], [address] [place], [place] [address]    Report Status 10/23/2021 FINAL  Final  Resp Panel by RT-PCR (Flu A&B, Covid) Nasopharyngeal Swab     Status: None   Collection Time: 10/18/21 10:57 PM   Specimen: Nasopharyngeal Swab; Nasopharyngeal(NP) swabs in vial transport medium  Result Value Ref Range Status   SARS Coronavirus 2 by RT PCR NEGATIVE NEGATIVE Final    Comment: (NOTE) SARS-CoV-2 target nucleic acids are NOT DETECTED.  The SARS-CoV-2 RNA is generally detectable in upper respiratory specimens during the acute phase of infection. The lowest concentration of SARS-CoV-2 viral copies this assay can detect is 138 copies/mL. A negative result does not preclude SARS-Cov-2 infection and should not be used as the sole basis for treatment or other patient management decisions. A negative result may occur with  improper specimen collection/handling, submission of specimen other than nasopharyngeal swab, presence of viral mutation(s)  within the areas targeted by this assay,  and inadequate number of viral copies(<138 copies/mL). A negative result must be combined with clinical observations, patient history, and epidemiological information. The expected result is Negative.  Fact Sheet for Patients:  EntrepreneurPulse.com.au  Fact Sheet for Healthcare Providers:  IncredibleEmployment.be  This test is no t yet approved or cleared by the [place] FDA and  has been authorized for detection and/or diagnosis of SARS-CoV-2 by FDA under an Emergency Use Authorization (EUA). This EUA will remain  in effect (meaning this test can be used) for the duration of the COVID-19 declaration under Section 564(b)(1) of the Act, 21 U.S.C.section 360bbb-3(b)(1), unless the authorization is terminated  or revoked sooner.       Influenza A by PCR NEGATIVE NEGATIVE Final   Influenza B by PCR NEGATIVE NEGATIVE Final    Comment: (NOTE) The Xpert Xpress SARS-CoV-2/FLU/RSV plus assay is intended as an aid in the diagnosis of influenza from Nasopharyngeal swab specimens and should not be used as a sole basis for treatment. Nasal washings and aspirates are unacceptable for Xpert Xpress SARS-CoV-2/FLU/RSV testing.  Fact Sheet for Patients: EntrepreneurPulse.com.au  Fact Sheet for Healthcare Providers: IncredibleEmployment.be  This test is not yet approved or cleared by the [place] FDA and has been authorized for detection and/or diagnosis of SARS-CoV-2 by FDA under an Emergency Use Authorization (EUA). This EUA will remain in effect (meaning this test can be used) for the duration of the COVID-19 declaration under Section 564(b)(1) of the Act, 21 U.S.C. section 360bbb-3(b)(1), unless the authorization is terminated or revoked.  Performed at [facility], [address] [place], [place] [address]   Surgical PCR screen     Status: None   Collection Time: 10/21/21 10:56 AM    Specimen: Nasal Mucosa; Nasal Swab  Result Value Ref Range Status   MRSA, PCR NEGATIVE NEGATIVE Final   Staphylococcus aureus NEGATIVE NEGATIVE Final    Comment: (NOTE) The Xpert SA Assay (FDA approved for NASAL specimens in patients 31 years of age and older), is one component of a comprehensive surveillance program. It is not intended to diagnose infection nor to guide or monitor treatment. Performed at [facility], [address] [place], [place] [address]      Labs: BNP (last 3 results) No results for input(s): BNP in the last 8760 hours. Basic Metabolic Panel: Recent Labs  Lab 10/18/21 1508 10/19/21 0626 10/20/21 0401 10/22/21 0459 10/23/21 0444  NA 135 136 139 140  --   K 3.7 3.4* 3.7 3.3*  --   CL 99 105 109 110  --   CO2 26 23 27 26   --   GLUCOSE 103* 85 100* 104*  --   BUN 12 11 14 9   --   CREATININE 0.61 0.61 0.52 0.60 0.57  CALCIUM 9.1 8.8* 8.2* 8.4*  --   MG  --  1.7  --  1.7  --   PHOS  --   --   --  3.2  --    Liver Function Tests: Recent Labs  Lab 10/18/21 1508 10/22/21 0459  AST 27  --   ALT 36  --   ALKPHOS 109  --   BILITOT 0.7  --   PROT 7.2  --   ALBUMIN 3.8 2.6*   No results for input(s): LIPASE, AMYLASE in the last 168 hours. No results for input(s): AMMONIA in the last 168 hours. CBC: Recent Labs  Lab 10/18/21 1508 10/19/21 0626 10/22/21  0459 10/22/21 1125  WBC 13.6* 8.7 8.5 11.7*  NEUTROABS 9.8*  --   --   --   HGB 12.3 11.9* 9.6* 10.6*  HCT 38.0 37.4 30.7* 33.9*  MCV 88.6 87.8 90.8 90.2  PLT 477* 342 253 300   Cardiac Enzymes: No results for input(s): CKTOTAL, CKMB, CKMBINDEX, TROPONINI in the last 168 hours. BNP: Invalid input(s): POCBNP CBG: No results for input(s): GLUCAP in the last 168 hours. D-Dimer No results for input(s): DDIMER in the last 72 hours. Hgb A1c No results for input(s): HGBA1C in the last 72 hours. Lipid Profile No results for input(s): CHOL, HDL, LDLCALC, TRIG, CHOLHDL, LDLDIRECT in  the last 72 hours. Thyroid function studies No results for input(s): TSH, T4TOTAL, T3FREE, THYROIDAB in the last 72 hours.  Invalid input(s): FREET3 Anemia work up No results for input(s): VITAMINB12, FOLATE, FERRITIN, TIBC, IRON, RETICCTPCT in the last 72 hours. Urinalysis    Component Value Date/Time   COLORURINE YELLOW 10/19/2021 0011   APPEARANCEUR CLEAR 10/19/2021 0011   LABSPEC <1.005 (L) 10/19/2021 0011   PHURINE 6.0 10/19/2021 0011   GLUCOSEU NEGATIVE 10/19/2021 0011   HGBUR NEGATIVE 10/19/2021 0011   BILIRUBINUR NEGATIVE 10/19/2021 0011   KETONESUR NEGATIVE 10/19/2021 0011   PROTEINUR NEGATIVE 10/19/2021 0011   NITRITE NEGATIVE 10/19/2021 0011   LEUKOCYTESUR SMALL (A) 10/19/2021 0011   Sepsis Labs Invalid input(s): PROCALCITONIN,  WBC,  LACTICIDVEN Microbiology Recent Results (from the past 240 hour(s))  Blood culture (routine x 2)     Status: None   Collection Time: 10/18/21 10:42 PM   Specimen: BLOOD  Result Value Ref Range Status   Specimen Description BLOOD RIGHT ASSIST CONTROL  Final   Special Requests   Final    BOTTLES DRAWN AEROBIC AND ANAEROBIC Blood Culture adequate volume   Culture   Final    NO GROWTH 5 DAYS Performed at [facility], [address], [place], [place] [address]    Report Status 10/23/2021 FINAL  Final  Blood culture (routine x 2)     Status: None   Collection Time: 10/18/21 10:47 PM   Specimen: BLOOD  Result Value Ref Range Status   Specimen Description BLOOD RIGHT FOREARM  Final   Special Requests   Final    BOTTLES DRAWN AEROBIC AND ANAEROBIC Blood Culture adequate volume   Culture   Final    NO GROWTH 5 DAYS Performed at [facility], [address] [place], [place] [address]    Report Status 10/23/2021 FINAL  Final  Resp Panel by RT-PCR (Flu A&B, Covid) Nasopharyngeal Swab     Status: None   Collection Time: 10/18/21 10:57 PM   Specimen: Nasopharyngeal Swab; Nasopharyngeal(NP) swabs in vial transport  medium  Result Value Ref Range Status   SARS Coronavirus 2 by RT PCR NEGATIVE NEGATIVE Final    Comment: (NOTE) SARS-CoV-2 target nucleic acids are NOT DETECTED.  The SARS-CoV-2 RNA is generally detectable in upper respiratory specimens during the acute phase of infection. The lowest concentration of SARS-CoV-2 viral copies this assay can detect is 138 copies/mL. A negative result does not preclude SARS-Cov-2 infection and should not be used as the sole basis for treatment or other patient management decisions. A negative result may occur with  improper specimen collection/handling, submission of specimen other than nasopharyngeal swab, presence of viral mutation(s) within the areas targeted by this assay, and inadequate number of viral copies(<138 copies/mL). A negative result must be combined with clinical observations, patient history, and epidemiological  information. The expected result is Negative.  Fact Sheet for Patients:  EntrepreneurPulse.com.au  Fact Sheet for Healthcare Providers:  IncredibleEmployment.be  This test is no t yet approved or cleared by the [place] FDA and  has been authorized for detection and/or diagnosis of SARS-CoV-2 by FDA under an Emergency Use Authorization (EUA). This EUA will remain  in effect (meaning this test can be used) for the duration of the COVID-19 declaration under Section 564(b)(1) of the Act, 21 U.S.C.section 360bbb-3(b)(1), unless the authorization is terminated  or revoked sooner.       Influenza A by PCR NEGATIVE NEGATIVE Final   Influenza B by PCR NEGATIVE NEGATIVE Final    Comment: (NOTE) The Xpert Xpress SARS-CoV-2/FLU/RSV plus assay is intended as an aid in the diagnosis of influenza from Nasopharyngeal swab specimens and should not be used as a sole basis for treatment. Nasal washings and aspirates are unacceptable for Xpert Xpress SARS-CoV-2/FLU/RSV testing.  Fact Sheet for  Patients: EntrepreneurPulse.com.au  Fact Sheet for Healthcare Providers: IncredibleEmployment.be  This test is not yet approved or cleared by the [place] FDA and has been authorized for detection and/or diagnosis of SARS-CoV-2 by FDA under an Emergency Use Authorization (EUA). This EUA will remain in effect (meaning this test can be used) for the duration of the COVID-19 declaration under Section 564(b)(1) of the Act, 21 U.S.C. section 360bbb-3(b)(1), unless the authorization is terminated or revoked.  Performed at [facility], [address], [place], [place] [address]   Surgical PCR screen     Status: None   Collection Time: 10/21/21 10:56 AM   Specimen: Nasal Mucosa; Nasal Swab  Result Value Ref Range Status   MRSA, PCR NEGATIVE NEGATIVE Final   Staphylococcus aureus NEGATIVE NEGATIVE Final    Comment: (NOTE) The Xpert SA Assay (FDA approved for NASAL specimens in patients 18 years of age and older), is one component of a comprehensive surveillance program. It is not intended to diagnose infection nor to guide or monitor treatment. Performed at [facility], [address] [place], [place] [address]      Time coordinating discharge:  40 minutes  SIGNED:   Barb Merino, MD  [facility] 10/23/2021, 8:39 AM

## 2021-10-23 NOTE — Progress Notes (Signed)
Physical Therapy Treatment Patient Details Name: [name] MRN: [id number] DOB: [birth year] Today's Date: 10/23/2021   History of Present Illness [name] is a 75 y.o. female seen in ed with complaints of presenting with infectious the fourth MTP.  Patient has seen Dr. Vickki Muff at [facility] [place] [facility] who referred her here.  Patient presents with multiple chronic complaints.  Foot pain from several years back pain for several years states that she was seen by her podiatrist and was asked to come to the hospital states that she likely needs to have an amputation.  Pt has past medical history of COPD, CAD, peripheral vascular disease status post mechanical thrombectomy and stent angioplasty of the right lower extremity with gangrene of the right toes in the past. 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    PT Comments    Pt seen for PT tx with pt eating lunch, declining participation at this time but then requesting to use restroom. Pt declines ambulating into bathroom reporting she will use BSC at home at first. Pt is able to complete bed mobility with mod I & stand pivot bed>BSC with mod I. Pt demonstrates decreased awareness as she does not don R post op shoe prior to transfer with PT donning it total assist & educating pt on need to wear shoe during weight bearing. After toileting pt is agreeable to sitting in chair to finish lunch but declines seated exercises at this time. During session pt also c/o feeling as though she has a piece of ice stuck in her throat & worried it will cause her to choke but pt not in any distress & conversing throughout session; after consuming liquid pt reports she feels better. Will continue to follow pt acutely to progress gait with LRAD & stair negotiation.    Recommendations for follow up therapy are one component of a multi-disciplinary discharge planning process, led by the attending physician.  Recommendations may be updated based on patient  status, additional functional criteria and insurance authorization.  Follow Up Recommendations  Skilled nursing-short term rehab (<3 hours/day)     Assistance Recommended at Discharge Intermittent Supervision/Assistance  Equipment Recommendations  Rolling walker (2 wheels);BSC/3in1    Recommendations for Other Services       Precautions / Restrictions Precautions Precautions: Fall Restrictions Weight Bearing Restrictions: Yes RLE Weight Bearing: Weight bearing as tolerated Other Position/Activity Restrictions: Post-op shoe to R foot for OOB     Mobility  Bed Mobility Overal bed mobility: Modified Independent             General bed mobility comments: HOB elevated, bed rails    Transfers Overall transfer level: Modified independent Equipment used: None Transfers: Sit to/from Stand;Bed to chair/wheelchair/BSC Sit to Stand: Modified independent (Device/Increase time) Stand pivot transfers: Modified independent (Device/Increase time)   Step pivot transfers: Modified independent (Device/Increase time)          Ambulation/Gait                   Stairs             Wheelchair Mobility    Modified Rankin (Stroke Patients Only)       Balance Overall balance assessment: Needs assistance Sitting-balance support: No upper extremity supported;Feet supported Sitting balance-Leahy Scale: Good     Standing balance support: Bilateral upper extremity supported;During functional activity Standing balance-Leahy Scale: Good  Cognition Arousal/Alertness: Awake/alert Behavior During Therapy: WFL for tasks assessed/performed Overall Cognitive Status: Within Functional Limits for tasks assessed                                          Exercises      General Comments        Pertinent Vitals/Pain Pain Assessment: Faces Faces Pain Scale: No hurt    Home Living                           Prior Function            PT Goals (current goals can now be found in the care plan section) Acute Rehab PT Goals Patient Stated Goal: go home to be with her dog PT Goal Formulation: With patient Time For Goal Achievement: 11/05/21 Potential to Achieve Goals: Good Progress towards PT goals: Progressing toward goals    Frequency    7X/week (error from yesterday, pt is 7x week s/p amputation)      PT Plan Current plan remains appropriate    Co-evaluation              AM-PAC PT "6 Clicks" Mobility   Outcome Measure  Help needed turning from your back to your side while in a flat bed without using bedrails?: None Help needed moving from lying on your back to sitting on the side of a flat bed without using bedrails?: None Help needed moving to and from a bed to a chair (including a wheelchair)?: None Help needed standing up from a chair using your arms (e.g., wheelchair or bedside chair)?: None Help needed to walk in hospital room?: A Little Help needed climbing 3-5 steps with a railing? : A Lot 6 Click Score: 21    End of Session   Activity Tolerance: Patient tolerated treatment well (pt requesting to finish lunch) Patient left: in chair;with chair alarm set;with call bell/phone within reach   PT Visit Diagnosis: Unsteadiness on feet (R26.81);Muscle weakness (generalized) (M62.81);Difficulty in walking, not elsewhere classified (R26.2)     Time: 3646-8032 PT Time Calculation (min) (ACUTE ONLY): 8 min  Charges:  $Therapeutic Activity: 8-22 mins                     Lavone Nian, PT, DPT 10/23/21, 1:09 PM    [name] 10/23/2021, 1:07 PM

## 2021-10-24 ENCOUNTER — Telehealth: Payer: Self-pay

## 2021-10-24 NOTE — Telephone Encounter (Signed)
Called patient and she stated that she was in a lot of pain and had trouble opening her pill bottle because her arms and hands were numb.She said she used a fork to help open bottle and they went everywhere. Some in trash, and found some on floor. She wanted DR N to write her another prescription. Informed her that he would not do this. I looked in her chart and she had one that could be filled yesterday.Patient with understanidng.

## 2021-10-24 NOTE — Telephone Encounter (Signed)
Pt is requesting a nurse call and did not specify

## 2021-10-25 LAB — SURGICAL PATHOLOGY

## 2021-11-03 ENCOUNTER — Telehealth: Payer: Self-pay

## 2021-11-03 NOTE — Telephone Encounter (Addendum)
She had another toe taken off and her pain meds are not taking care of it. Dr. Luana Shu said he cant give her meds due to her pain contract so she wants to know if Dr. Consuela Mimes will adjust her medicines or give her more.

## 2021-11-03 NOTE — Telephone Encounter (Signed)
Sent post procedure pain document to Dr Luana Shu Maryruth Hancock.  [phone] fax [phone]

## 2021-11-04 ENCOUNTER — Telehealth: Payer: Self-pay | Admitting: Pain Medicine

## 2021-11-04 NOTE — Telephone Encounter (Signed)
Attempted to call patient to notify her that the form had been sent.  Call would not go through.  Refaxed surgeons letter.

## 2021-11-04 NOTE — Telephone Encounter (Signed)
Patient lvmail this am stating Dr. Deborra Medina office did not receive fax sent yesterday allowing them to write medications. Please call patient  [phone]

## 2021-11-08 NOTE — Progress Notes (Deleted)
PROVIDER NOTE: Information contained herein reflects review and annotations entered in association with encounter. Interpretation of such information and data should be left to medically-trained personnel. Information provided to patient can be located elsewhere in the medical record under "Patient Instructions". Document created using STT-dictation technology, any transcriptional errors that may result from process are unintentional.    Patient: [name]  Service Category: E/M  Provider: Gaspar Cola, MD  DOB: 04-26-1946  DOS: 11/09/2021  Specialty: Interventional Pain Management  MRN: [id number]  Setting: Ambulatory outpatient  PCP: Albina Billet, MD  Type: Established Patient    Referring Provider: Albina Billet, MD  Location: Office  Delivery: Face-to-face     HPI  Ms. [name] Kimrey, a 75 y.o. year old female, is here today because of her No primary diagnosis found.. Ms. Hulme primary complain today is No chief complaint on file. Last encounter: My last encounter with her was on 11/04/2021. Pertinent problems: Ms. 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); Lumbar facet hypertrophy (Bilateral); Lumbar facet syndrome (Right); Lumbar grade 1 Anterolisthesis of L3 over L4; Hip fracture ([place]); Pressure injury of skin; Closed displaced intertrochanteric fracture of right femur ([place]); Right hip pain;  Intertrochanteric fracture of right hip, sequela; Chronic sacroiliac joint pain (Right); Neurogenic pain; Chronic low back pain (1ry area of Pain) (Right) w/o sciatica; Chronic hip pain (Bilateral); Compression fracture of L3 vertebra ([place]); 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 Pain Assessment: Severity of   is reported as a  /10. Location:    / . Onset:  . Quality:  . Timing:  . Modifying factor(s):  Marland Kitchen Vitals:  vitals were not taken for this visit.   Reason for encounter: follow-up evaluation.  On 10/10/2021 she switched her appointment from a face-to-face to a virtual visit indicating that she had transportation problems.  At that time I sent a prescription to the pharmacy for oxycodone IR 5 mg to be taken 1 tablet p.o. twice daily.  Upon checking her PMP today, it would seem that she did not get it filled.  However, she did fill a prescription on 10/24/2021 for oxycodone IR 10 mg tablets (#20) prescribed by Dickie La, MD a hospitalist in [place].  Her alprazolam dose was increased on 09/06/2021 from 0.5 mg 4 times daily (2 mg/day) to 1 mg p.o. 3 times daily (3 mg/day).  The last time that she had one of my prescriptions failed was on 11/11/2020, almost 1 year ago.  In view of the fact that she is on benzodiazepines and the dose has been going up, as well as the fact that she has been getting pain medications from other physicians, I will not be renewing any opioid analgesics.  We will continue to remain available for  Pharmacotherapy Assessment  Analgesic: Oxycodone IR 5 mg, 1 tab PO BID (10 mg/day of oxycodone) MME/day: 15 mg/day.   Monitoring: [place] PMP: PDMP reviewed  during this encounter.       Pharmacotherapy: No side-effects or adverse reactions reported. Compliance: No problems identified. Effectiveness: Clinically acceptable.  No  notes on file  UDS:  Summary  Date Value Ref Range Status  07/13/2021 Note  Final    Comment:    ==================================================================== [employer] Select 13 (MW) ==================================================================== Test                             Result       Flag       Units  Drug Present and Declared for Prescription Verification   Alprazolam                     207          EXPECTED   ng/mg creat   Alpha-hydroxyalprazolam        977          EXPECTED   ng/mg creat    Source of alprazolam is a scheduled prescription medication. Alpha-    hydroxyalprazolam is an expected metabolite of alprazolam.  Drug Absent but Declared for Prescription Verification   Oxycodone                      Not Detected UNEXPECTED ng/mg creat ==================================================================== Test                      Result    Flag   Units      Ref Range   Creatinine              149              mg/dL      >=20 ==================================================================== Declared Medications:  The flagging and interpretation on this report are based on the  following declared medications.  Unexpected results may arise from  inaccuracies in the declared medications.   **Note: The testing scope of this panel includes these medications:   Alprazolam (Xanax)  Oxycodone (Roxicodone)  Oxycodone (Percocet)   **Note: The testing scope of this panel does not include the  following reported medications:   Acetaminophen (Percocet)  Albuterol (Ventolin HFA)  Albuterol (Combivent)  Amlodipine (Norvasc)  Apixaban (Eliquis)  Aspirin  Atorvastatin (Lipitor)  Bupropion (Wellbutrin SR)  Escitalopram (Lexapro)  Fluticasone (Advair)  Gabapentin (Neurontin)  Ipratropium (Combivent)  Levothyroxine (Synthroid)  Montelukast (Singulair)  Salmeterol (Advair)  Tizanidine (Zanaflex)  Trazodone  (Desyrel) ==================================================================== For clinical consultation, please call [phone]. ====================================================================      ROS  Constitutional: Denies any fever or chills Gastrointestinal: No reported hemesis, hematochezia, vomiting, or acute GI distress Musculoskeletal: Denies any acute onset joint swelling, redness, loss of ROM, or weakness Neurological: No reported episodes of acute onset apraxia, aphasia, dysarthria, agnosia, amnesia, paralysis, loss of coordination, or loss of consciousness  Medication Review  ALPRAZolam, Fluticasone-Salmeterol, Ipratropium-Albuterol, Oxycodone HCl, albuterol, amLODipine, apixaban, aspirin EC, atorvastatin, buPROPion, clopidogrel, escitalopram, levothyroxine, montelukast, oxyCODONE, tizanidine, and traZODone  History Review  Allergy: Ms. Kugler has No Known Allergies. Drug: Ms. Fletchall  reports no history of drug use. Alcohol:  reports no history of alcohol use. Tobacco:  reports that she has been smoking cigarettes. She has never used smokeless tobacco. Social: Ms. Tamayo  reports that she has been smoking cigarettes. She has never used smokeless tobacco. She reports that she does not drink alcohol and does not use drugs. Medical:  has a past medical history  of Anxiety, CAD (coronary artery disease), Carpal tunnel syndrome, Cataract, Complication of anesthesia, COPD (chronic obstructive pulmonary disease) ([place]), CRP elevated (09/14/2015), Depression, Difficult intubation, Dyslipidemia, Dyspnea, Dysrhythmia, Elevated sedimentation rate (09/14/2015), Esophageal spasm, Gastrointestinal parasites, GERD (gastroesophageal reflux disease), Hiatal hernia, History of peptic ulcer disease, Hypertension, Hyperthyroidism, Hypothyroidism, Low magnesium levels (09/14/2015), Pelvic fracture ([place]) (2008), Peripheral vascular disease ([place]), Reflux, Rotator cuff injury, and Stenosis, spinal,  lumbar. Surgical: Ms. Stanfill  has a past surgical history that includes Cesarean section; Neck surgery; Cholecystectomy; Carpal tunnel release; Rotator cuff repair; Shoulder arthroscopy (12/07/2011); Shoulder surgery (12/07/2011); Intramedullary (im) nail intertrochanteric (Right, 10/01/2016); Fracture surgery; Appendectomy; Nose surgery; Back surgery; Cataract extraction w/PHACO (Right, 08/07/2017); Hip surgery; Cataract extraction w/PHACO (Left, 09/04/2017); Kyphoplasty (N/A, 08/20/2018); Lower Extremity Angiography (Right, 12/18/2019); Lower Extremity Angiography (Right, 12/02/2020); Lower Extremity Angiography (Right, 12/23/2020); Lower Extremity Angiography (Right, 12/24/2020); Amputation toe (Right, 12/26/2020); Intramedullary (im) nail intertrochanteric (Left, 01/14/2021); Amputation toe (Right, 04/22/2021); Lower Extremity Angiography (Right, 10/20/2021); and Amputation toe (Right, 10/21/2021). Family: family history includes Aneurysm in an other family member.  Laboratory Chemistry Profile   Renal Lab Results  Component Value Date   BUN 9 10/22/2021   CREATININE 0.57 10/23/2021   BCR 9 (L) 10/06/2019   GFRAA >60 12/18/2019   GFRNONAA >60 10/23/2021    Hepatic Lab Results  Component Value Date   AST 27 10/18/2021   ALT 36 10/18/2021   ALBUMIN 2.6 (L) 10/22/2021   ALKPHOS 109 10/18/2021    Electrolytes Lab Results  Component Value Date   NA 140 10/22/2021   K 3.3 (L) 10/22/2021   CL 110 10/22/2021   CALCIUM 8.4 (L) 10/22/2021   MG 1.7 10/22/2021   PHOS 3.2 10/22/2021    Bone Lab Results  Component Value Date   VD25OH 14.25 (L) 01/12/2021   25OHVITD1 15 (L) 10/06/2019   25OHVITD2 <1.0 10/06/2019   25OHVITD3 14 10/06/2019    Inflammation (CRP: Acute Phase) (ESR: Chronic Phase) Lab Results  Component Value Date   CRP 11.0 (H) 10/22/2021   ESRSEDRATE 17 10/22/2021   LATICACIDVEN 1.4 10/19/2021         Note: Above Lab results reviewed.  Recent Imaging Review  PERIPHERAL VASCULAR  CATHETERIZATION See surgical note for result. Note: Reviewed        Physical Exam  General appearance: Well nourished, well developed, and well hydrated. In no apparent acute distress Mental status: Alert, oriented x 3 (person, place, & time)       Respiratory: No evidence of acute respiratory distress Eyes: PERLA Vitals: There were no vitals taken for this visit. BMI: Estimated body mass index is 18.92 kg/m as calculated from the following:   Height as of 10/18/21: 5' 4"  (1.626 m).   Weight as of 10/18/21: 110 lb 3.7 oz (50 kg). Ideal: Patient weight not recorded  Assessment   Status Diagnosis  Controlled Controlled Controlled 1. Chronic pain syndrome   2. Pharmacologic therapy   3. Chronic use of opiate for therapeutic purpose   4. Encounter for medication management      Updated Problems: No problems updated.  Plan of Care  Problem-specific:  No problem-specific Assessment & Plan notes found for this encounter.  Ms. Aizlyn Schifano has a current medication list which includes the following long-term medication(s): albuterol, albuterol, apixaban, atorvastatin, bupropion, combivent respimat, escitalopram, fluticasone-salmeterol, levothyroxine, oxycodone, oxycodone hcl, tizanidine, and trazodone.  Pharmacotherapy (Medications Ordered): No orders of the defined types were placed in this encounter.  Orders:  No orders of  the defined types were placed in this encounter.  Follow-up plan:   No follow-ups on file.     Interventional Therapies  Risk   Complexity Considerations:   Estimated body mass index is 18.92 kg/m as calculated from the following:   Height as of 10/18/21: 5' 4"  (1.626 m).   Weight as of 10/18/21: 110 lb 3.7 oz (50 kg). Eliquis anticoagulation (Stop: 3 days   Restart: 6 hours)   Planned   Pending:      Under consideration:   Diagnostic bilateral cervical facet block  Diagnostic right CESI  Diagnostic bilateral IA shoulder injection  Diagnostic  bilateral suprascapular NB   Diagnostic left L3-4 TFESI  Diagnostic L3-4 vs L4-5 LESI    Completed:   Diagnostic right lumbar facet + right SI joint Blk x1 (06/26/2017) (100/100/0/0)    Therapeutic   Palliative (PRN) options:   Palliative SI joint Blk  Palliative lumbar facet MBB     Recent Visits Date Type Provider Dept  10/10/21 Office Visit Milinda Pointer, MD [facility]  Showing recent visits within past 90 days and meeting all other requirements Future Appointments Date Type Provider Dept  11/09/21 Appointment Milinda Pointer, MD [facility]  Showing future appointments within next 90 days and meeting all other requirements  I discussed the assessment and treatment plan with the patient. The patient was provided an opportunity to ask questions and all were answered. The patient agreed with the plan and demonstrated an understanding of the instructions.  Patient advised to call back or seek an in-person evaluation if the symptoms or condition worsens.  Duration of encounter: *** minutes.  Note by: Gaspar Cola, MD Date: 11/09/2021; Time: 12:23 PM

## 2021-11-09 ENCOUNTER — Other Ambulatory Visit: Payer: Self-pay

## 2021-11-09 ENCOUNTER — Ambulatory Visit: Payer: Medicare HMO | Admitting: Student in an Organized Health Care Education/Training Program

## 2021-11-20 ENCOUNTER — Other Ambulatory Visit: Payer: Self-pay

## 2021-11-20 ENCOUNTER — Inpatient Hospital Stay
Admission: EM | Admit: 2021-11-20 | Discharge: 2021-11-25 | DRG: 981 | Disposition: A | Discharge status: Home Health | Payer: Medicare HMO | Attending: Internal Medicine | Admitting: Internal Medicine

## 2021-11-20 ENCOUNTER — Emergency Department: Payer: Medicare HMO

## 2021-11-20 DIAGNOSIS — J9601 Acute respiratory failure with hypoxia: Secondary | ICD-10-CM

## 2021-11-20 DIAGNOSIS — T8132XA Disruption of internal operation (surgical) wound, not elsewhere classified, initial encounter: Secondary | ICD-10-CM | POA: Diagnosis present

## 2021-11-20 DIAGNOSIS — Z79891 Long term (current) use of opiate analgesic: Secondary | ICD-10-CM

## 2021-11-20 DIAGNOSIS — Z20822 Contact with and (suspected) exposure to covid-19: Secondary | ICD-10-CM | POA: Diagnosis present

## 2021-11-20 DIAGNOSIS — Y835 Amputation of limb(s) as the cause of abnormal reaction of the patient, or of later complication, without mention of misadventure at the time of the procedure: Secondary | ICD-10-CM | POA: Diagnosis present

## 2021-11-20 DIAGNOSIS — E8809 Other disorders of plasma-protein metabolism, not elsewhere classified: Secondary | ICD-10-CM

## 2021-11-20 DIAGNOSIS — D509 Iron deficiency anemia, unspecified: Secondary | ICD-10-CM | POA: Diagnosis present

## 2021-11-20 DIAGNOSIS — Z8711 Personal history of peptic ulcer disease: Secondary | ICD-10-CM

## 2021-11-20 DIAGNOSIS — F32A Depression, unspecified: Secondary | ICD-10-CM

## 2021-11-20 DIAGNOSIS — Z87440 Personal history of urinary (tract) infections: Secondary | ICD-10-CM

## 2021-11-20 DIAGNOSIS — G8929 Other chronic pain: Secondary | ICD-10-CM

## 2021-11-20 DIAGNOSIS — E860 Dehydration: Secondary | ICD-10-CM | POA: Diagnosis present

## 2021-11-20 DIAGNOSIS — F419 Anxiety disorder, unspecified: Secondary | ICD-10-CM

## 2021-11-20 DIAGNOSIS — G894 Chronic pain syndrome: Secondary | ICD-10-CM

## 2021-11-20 DIAGNOSIS — Z79899 Other long term (current) drug therapy: Secondary | ICD-10-CM

## 2021-11-20 DIAGNOSIS — I2510 Atherosclerotic heart disease of native coronary artery without angina pectoris: Secondary | ICD-10-CM | POA: Diagnosis present

## 2021-11-20 DIAGNOSIS — Z89421 Acquired absence of other right toe(s): Secondary | ICD-10-CM

## 2021-11-20 DIAGNOSIS — J441 Chronic obstructive pulmonary disease with (acute) exacerbation: Secondary | ICD-10-CM | POA: Diagnosis not present

## 2021-11-20 DIAGNOSIS — F17210 Nicotine dependence, cigarettes, uncomplicated: Secondary | ICD-10-CM | POA: Diagnosis present

## 2021-11-20 DIAGNOSIS — D649 Anemia, unspecified: Secondary | ICD-10-CM | POA: Diagnosis not present

## 2021-11-20 DIAGNOSIS — I739 Peripheral vascular disease, unspecified: Secondary | ICD-10-CM

## 2021-11-20 DIAGNOSIS — I10 Essential (primary) hypertension: Secondary | ICD-10-CM | POA: Diagnosis present

## 2021-11-20 DIAGNOSIS — F1320 Sedative, hypnotic or anxiolytic dependence, uncomplicated: Secondary | ICD-10-CM

## 2021-11-20 DIAGNOSIS — M1990 Unspecified osteoarthritis, unspecified site: Secondary | ICD-10-CM | POA: Diagnosis present

## 2021-11-20 DIAGNOSIS — M86271 Subacute osteomyelitis, right ankle and foot: Secondary | ICD-10-CM | POA: Diagnosis present

## 2021-11-20 DIAGNOSIS — Z9049 Acquired absence of other specified parts of digestive tract: Secondary | ICD-10-CM

## 2021-11-20 DIAGNOSIS — E871 Hypo-osmolality and hyponatremia: Secondary | ICD-10-CM

## 2021-11-20 DIAGNOSIS — E039 Hypothyroidism, unspecified: Secondary | ICD-10-CM

## 2021-11-20 DIAGNOSIS — E43 Unspecified severe protein-calorie malnutrition: Secondary | ICD-10-CM | POA: Diagnosis present

## 2021-11-20 DIAGNOSIS — E46 Unspecified protein-calorie malnutrition: Secondary | ICD-10-CM

## 2021-11-20 DIAGNOSIS — E785 Hyperlipidemia, unspecified: Secondary | ICD-10-CM | POA: Diagnosis present

## 2021-11-20 DIAGNOSIS — F13930 Sedative, hypnotic or anxiolytic use, unspecified with withdrawal, uncomplicated: Secondary | ICD-10-CM

## 2021-11-20 DIAGNOSIS — F13239 Sedative, hypnotic or anxiolytic dependence with withdrawal, unspecified: Secondary | ICD-10-CM | POA: Diagnosis present

## 2021-11-20 DIAGNOSIS — Z9842 Cataract extraction status, left eye: Secondary | ICD-10-CM

## 2021-11-20 DIAGNOSIS — R059 Cough, unspecified: Secondary | ICD-10-CM

## 2021-11-20 DIAGNOSIS — E876 Hypokalemia: Secondary | ICD-10-CM | POA: Diagnosis not present

## 2021-11-20 DIAGNOSIS — M48061 Spinal stenosis, lumbar region without neurogenic claudication: Secondary | ICD-10-CM | POA: Diagnosis present

## 2021-11-20 DIAGNOSIS — J432 Centrilobular emphysema: Secondary | ICD-10-CM | POA: Diagnosis not present

## 2021-11-20 DIAGNOSIS — K219 Gastro-esophageal reflux disease without esophagitis: Secondary | ICD-10-CM | POA: Diagnosis present

## 2021-11-20 DIAGNOSIS — Z9841 Cataract extraction status, right eye: Secondary | ICD-10-CM

## 2021-11-20 DIAGNOSIS — Z981 Arthrodesis status: Secondary | ICD-10-CM

## 2021-11-20 DIAGNOSIS — Z961 Presence of intraocular lens: Secondary | ICD-10-CM | POA: Diagnosis present

## 2021-11-20 DIAGNOSIS — Z6821 Body mass index (BMI) 21.0-21.9, adult: Secondary | ICD-10-CM

## 2021-11-20 LAB — CBC WITH DIFFERENTIAL/PLATELET
Abs Immature Granulocytes: 0.03 10*3/uL (ref 0.00–0.07)
Basophils Absolute: 0 10*3/uL (ref 0.0–0.1)
Basophils Relative: 0 %
Eosinophils Absolute: 0.2 10*3/uL (ref 0.0–0.5)
Eosinophils Relative: 3 %
HCT: 33.5 % — ABNORMAL LOW (ref 36.0–46.0)
Hemoglobin: 10.5 g/dL — ABNORMAL LOW (ref 12.0–15.0)
Immature Granulocytes: 0 %
Lymphocytes Relative: 18 %
Lymphs Abs: 1.6 10*3/uL (ref 0.7–4.0)
MCH: 27.6 pg (ref 26.0–34.0)
MCHC: 31.3 g/dL (ref 30.0–36.0)
MCV: 87.9 fL (ref 80.0–100.0)
Monocytes Absolute: 0.4 10*3/uL (ref 0.1–1.0)
Monocytes Relative: 5 %
Neutro Abs: 6.4 10*3/uL (ref 1.7–7.7)
Neutrophils Relative %: 74 %
Platelets: 369 10*3/uL (ref 150–400)
RBC: 3.81 MIL/uL — ABNORMAL LOW (ref 3.87–5.11)
RDW: 14.6 % (ref 11.5–15.5)
WBC: 8.7 10*3/uL (ref 4.0–10.5)
nRBC: 0 % (ref 0.0–0.2)

## 2021-11-20 LAB — COMPREHENSIVE METABOLIC PANEL
ALT: 13 U/L (ref 0–44)
AST: 37 U/L (ref 15–41)
Albumin: 3.4 g/dL — ABNORMAL LOW (ref 3.5–5.0)
Alkaline Phosphatase: 66 U/L (ref 38–126)
Anion gap: 11 (ref 5–15)
BUN: 11 mg/dL (ref 8–23)
CO2: 21 mmol/L — ABNORMAL LOW (ref 22–32)
Calcium: 8.6 mg/dL — ABNORMAL LOW (ref 8.9–10.3)
Chloride: 101 mmol/L (ref 98–111)
Creatinine, Ser: 0.57 mg/dL (ref 0.44–1.00)
GFR, Estimated: >60 mL/min (ref >60)
Glucose, Bld: 84 mg/dL (ref 70–99)
Potassium: 4.4 mmol/L (ref 3.5–5.1)
Sodium: 133 mmol/L — ABNORMAL LOW (ref 135–145)
Total Bilirubin: 1.2 mg/dL (ref 0.3–1.2)
Total Protein: 7.3 g/dL (ref 6.5–8.1)

## 2021-11-20 LAB — BLOOD GAS, VENOUS
Acid-base deficit: 0.4 mmol/L (ref 0.0–2.0)
Bicarbonate: 24.8 mmol/L (ref 20.0–28.0)
O2 Saturation: 89.1 %
Patient temperature: 37
pCO2, Ven: 42 mmHg — ABNORMAL LOW (ref 44.0–60.0)
pH, Ven: 7.38 (ref 7.250–7.430)
pO2, Ven: 58 mmHg — ABNORMAL HIGH (ref 32.0–45.0)

## 2021-11-20 LAB — RESP PANEL BY RT-PCR (FLU A&B, COVID) ARPGX2
Influenza A by PCR: NEGATIVE
Influenza B by PCR: NEGATIVE
SARS Coronavirus 2 by RT PCR: NEGATIVE

## 2021-11-20 LAB — TROPONIN I (HIGH SENSITIVITY)
Troponin I (High Sensitivity): 8 ng/L (ref <18)
Troponin I (High Sensitivity): 9 ng/L (ref <18)

## 2021-11-20 LAB — PROCALCITONIN: Procalcitonin: <0.1 ng/mL

## 2021-11-20 LAB — BRAIN NATRIURETIC PEPTIDE: B Natriuretic Peptide: 49.9 pg/mL (ref 0.0–100.0)

## 2021-11-20 MED ORDER — SODIUM CHLORIDE 0.9 % IV SOLN
2.0000 g | Freq: Once | INTRAVENOUS | Status: AC
  Administered 2021-11-20: 2 g via INTRAVENOUS
  Filled 2021-11-20: qty 20

## 2021-11-20 MED ORDER — NICOTINE 21 MG/24HR TD PT24
21.0000 mg | MEDICATED_PATCH | Freq: Every day | TRANSDERMAL | Status: DC | PRN
  Administered 2021-11-20: 15:00:00 21 mg via TRANSDERMAL
  Filled 2021-11-20 (×2): qty 1

## 2021-11-20 MED ORDER — LEVOTHYROXINE SODIUM 88 MCG PO TABS
88.0000 ug | ORAL_TABLET | Freq: Every day | ORAL | Status: DC
  Administered 2021-11-21 – 2021-11-25 (×5): 88 ug via ORAL
  Filled 2021-11-20 (×6): qty 1

## 2021-11-20 MED ORDER — ENOXAPARIN SODIUM 40 MG/0.4ML IJ SOSY
40.0000 mg | PREFILLED_SYRINGE | INTRAMUSCULAR | Status: DC
  Administered 2021-11-20 – 2021-11-24 (×5): 40 mg via SUBCUTANEOUS
  Filled 2021-11-20 (×5): qty 0.4

## 2021-11-20 MED ORDER — ALBUTEROL SULFATE (2.5 MG/3ML) 0.083% IN NEBU
2.5000 mg | INHALATION_SOLUTION | Freq: Once | RESPIRATORY_TRACT | Status: AC
  Administered 2021-11-20: 2.5 mg via RESPIRATORY_TRACT
  Filled 2021-11-20: qty 3

## 2021-11-20 MED ORDER — LACTATED RINGERS IV SOLN: INTRAVENOUS | Status: AC

## 2021-11-20 MED ORDER — ALBUTEROL SULFATE (2.5 MG/3ML) 0.083% IN NEBU
2.5000 mg | INHALATION_SOLUTION | RESPIRATORY_TRACT | Status: DC | PRN
  Administered 2021-11-21 – 2021-11-23 (×2): 2.5 mg via RESPIRATORY_TRACT
  Filled 2021-11-20 (×2): qty 3

## 2021-11-20 MED ORDER — ALPRAZOLAM 0.5 MG PO TABS
1.0000 mg | ORAL_TABLET | Freq: Once | ORAL | Status: AC
  Administered 2021-11-20: 1 mg via ORAL
  Filled 2021-11-20: qty 2

## 2021-11-20 MED ORDER — OXYCODONE-ACETAMINOPHEN 5-325 MG PO TABS
2.0000 | ORAL_TABLET | Freq: Once | ORAL | Status: AC
  Administered 2021-11-20: 2 via ORAL
  Filled 2021-11-20: qty 2

## 2021-11-20 MED ORDER — PREDNISONE 20 MG PO TABS
40.0000 mg | ORAL_TABLET | Freq: Every day | ORAL | Status: DC
  Administered 2021-11-21 – 2021-11-23 (×3): 40 mg via ORAL
  Filled 2021-11-20 (×3): qty 2

## 2021-11-20 MED ORDER — IPRATROPIUM-ALBUTEROL 0.5-2.5 (3) MG/3ML IN SOLN
3.0000 mL | Freq: Once | RESPIRATORY_TRACT | Status: AC
  Administered 2021-11-20: 3 mL via RESPIRATORY_TRACT
  Filled 2021-11-20: qty 6

## 2021-11-20 MED ORDER — MAGNESIUM SULFATE 2 GM/50ML IV SOLN
2.0000 g | Freq: Once | INTRAVENOUS | Status: AC
  Administered 2021-11-20: 2 g via INTRAVENOUS
  Filled 2021-11-20: qty 50

## 2021-11-20 MED ORDER — SODIUM CHLORIDE 0.9 % IV BOLUS: 1000.0000 mL | Freq: Once | INTRAVENOUS | Status: DC

## 2021-11-20 MED ORDER — TRAZODONE HCL 50 MG PO TABS
300.0000 mg | ORAL_TABLET | Freq: Every evening | ORAL | Status: DC | PRN
  Administered 2021-11-21 – 2021-11-24 (×5): 300 mg via ORAL
  Filled 2021-11-20 (×5): qty 6

## 2021-11-20 MED ORDER — MONTELUKAST SODIUM 10 MG PO TABS
10.0000 mg | ORAL_TABLET | Freq: Every day | ORAL | Status: DC
  Administered 2021-11-20 – 2021-11-25 (×6): 10 mg via ORAL
  Filled 2021-11-20 (×6): qty 1

## 2021-11-20 MED ORDER — OXYCODONE HCL 5 MG PO TABS
10.0000 mg | ORAL_TABLET | Freq: Four times a day (QID) | ORAL | Status: DC | PRN
  Administered 2021-11-20 – 2021-11-23 (×9): 10 mg via ORAL
  Filled 2021-11-20 (×8): qty 2

## 2021-11-20 MED ORDER — UMECLIDINIUM-VILANTEROL 62.5-25 MCG/ACT IN AEPB
1.0000 | INHALATION_SPRAY | Freq: Every day | RESPIRATORY_TRACT | Status: DC
  Administered 2021-11-21 – 2021-11-25 (×5): 1 via RESPIRATORY_TRACT
  Filled 2021-11-20: qty 14

## 2021-11-20 MED ORDER — BUPROPION HCL ER (SR) 150 MG PO TB12
150.0000 mg | ORAL_TABLET | Freq: Every day | ORAL | Status: DC
  Administered 2021-11-20 – 2021-11-25 (×6): 150 mg via ORAL
  Filled 2021-11-20 (×6): qty 1

## 2021-11-20 MED ORDER — SODIUM CHLORIDE 0.9 % IV BOLUS
500.0000 mL | Freq: Once | INTRAVENOUS | Status: AC
  Administered 2021-11-20: 500 mL via INTRAVENOUS

## 2021-11-20 MED ORDER — ATORVASTATIN CALCIUM 20 MG PO TABS
10.0000 mg | ORAL_TABLET | Freq: Every evening | ORAL | Status: DC
  Administered 2021-11-20 – 2021-11-24 (×4): 10 mg via ORAL
  Filled 2021-11-20 (×5): qty 1

## 2021-11-20 MED ORDER — ALPRAZOLAM 1 MG PO TABS
1.0000 mg | ORAL_TABLET | Freq: Three times a day (TID) | ORAL | Status: DC | PRN
  Administered 2021-11-20 – 2021-11-25 (×14): 1 mg via ORAL
  Filled 2021-11-20 (×7): qty 1
  Filled 2021-11-20: qty 2
  Filled 2021-11-20 (×7): qty 1

## 2021-11-20 MED ORDER — SODIUM CHLORIDE 0.9 % IV SOLN
500.0000 mg | Freq: Once | INTRAVENOUS | Status: AC
  Administered 2021-11-20: 500 mg via INTRAVENOUS
  Filled 2021-11-20: qty 5

## 2021-11-20 MED ORDER — ESCITALOPRAM OXALATE 10 MG PO TABS
10.0000 mg | ORAL_TABLET | Freq: Every day | ORAL | Status: DC
  Administered 2021-11-20 – 2021-11-25 (×6): 10 mg via ORAL
  Filled 2021-11-20 (×6): qty 1

## 2021-11-20 MED ORDER — OXYCODONE HCL 5 MG PO TABS
5.0000 mg | ORAL_TABLET | Freq: Four times a day (QID) | ORAL | Status: DC | PRN
  Administered 2021-11-20 – 2021-11-23 (×3): 5 mg via ORAL
  Filled 2021-11-20 (×5): qty 1

## 2021-11-20 MED ORDER — AMLODIPINE BESYLATE 5 MG PO TABS
5.0000 mg | ORAL_TABLET | Freq: Every day | ORAL | Status: DC
  Administered 2021-11-20 – 2021-11-25 (×6): 5 mg via ORAL
  Filled 2021-11-20 (×6): qty 1

## 2021-11-20 MED ORDER — LACTATED RINGERS IV SOLN: Freq: Once | INTRAVENOUS | Status: AC

## 2021-11-20 MED ORDER — ALBUTEROL SULFATE HFA 108 (90 BASE) MCG/ACT IN AERS: 2.0000 | INHALATION_SPRAY | Freq: Four times a day (QID) | RESPIRATORY_TRACT | Status: DC | PRN

## 2021-11-20 MED ORDER — CLOPIDOGREL BISULFATE 75 MG PO TABS
75.0000 mg | ORAL_TABLET | Freq: Every day | ORAL | Status: DC
  Administered 2021-11-20 – 2021-11-25 (×6): 75 mg via ORAL
  Filled 2021-11-20 (×6): qty 1

## 2021-11-20 NOTE — Assessment & Plan Note (Signed)
Stable since restarted Xaanx, will continue other home meds and f/u outpatient

## 2021-11-20 NOTE — Assessment & Plan Note (Signed)
Stable, follow up outpatient to monitor

## 2021-11-20 NOTE — ED Notes (Signed)
Overdue meds only just verified by pharmacy.

## 2021-11-20 NOTE — Assessment & Plan Note (Signed)
Stable, trend CBC, not adherent to home ASA/Plavix for PAD but no s/s bleeding now, trend AM CBC

## 2021-11-20 NOTE — Assessment & Plan Note (Signed)
On VTE Ppx while here, restart Plavix outpatient

## 2021-11-20 NOTE — ED Notes (Signed)
Brought to pt ice water, applesauce, graham crackers and PB.  Pt appears calm with unlabored breathing at this time.

## 2021-11-20 NOTE — ED Provider Notes (Signed)
[facility] - Inpatient Provider Note    Event Date/Time   First MD Initiated Contact with Patient 11/20/21 [phone]     (approximate)   History   Respiratory Distress and Anxiety   HPI  [name] is a 76 y.o. female with extensive past medical history including chronic COPD, chronic pain, anxiety on benzodiazepines, chronic opioid dependence, recurrent UTIs, here with shortness of breath.  Patient arrives in moderate respiratory distress somewhat limiting history.  Per report, she has been progressively more short of breath for the last 2 days or so.  She feels like this is due to running out of her Xanax and feeling more anxious.  She also states she has had a cough with increasing sputum production.  This was initially yellow-green but is now clear.  Denies fevers.  Per EMS, when they arrived the patient was markedly anxious and hypoxic.  She has had difficulty tolerating breathing treatments due to her anxiety and she is requesting something for this.  Denies any known leg swelling.  Denies any known sick contacts or COVID exposures.  Denies any chest pain.  She endorses anxiety, tremors, dry mouth, and shortness of breath.  She feels mildly lightheaded.     Physical Exam   Triage Vital Signs: ED Triage Vitals  Enc Vitals Group     BP 11/20/21 0924 (!) 161/119     Pulse Rate 11/20/21 0924 (!) 142     Resp 11/20/21 0924 (!) 29     Temp --      Temp src --      SpO2 11/20/21 0924 92 %     Weight 11/20/21 0926 134 lb (60.8 kg)     Height 11/20/21 0926 5\' 4"  (1.626 m)     Head Circumference --      Peak Flow --      Pain Score --      Pain Loc --      Pain Edu? --      Excl. in [place]? --     Most recent vital signs: Vitals:   11/20/21 1400 11/20/21 1430  BP: 125/75 129/80  Pulse: (!) 125 (!) 120  Resp: (!) 22 (!) 23  SpO2: 96% 95%     General: Awake, mild respiratory distress, appears anxious. CV:  Good peripheral perfusion.  Tachycardic, auscultation  slightly limited due to increased work of breathing and respiratory effort. Resp:  Increased work of breathing with tachypnea and diffuse wheezing.  Diminished aeration.  Suprasternal retractions noted.  Speaking in full sentences, however. Abd:  No distention.  No tenderness.  No rebound or guarding. Other:  Chronic wound to the right lower extremity.  No significant edema.   ED Results / Procedures / Treatments   Labs (all labs ordered are listed, but only abnormal results are displayed) Labs Reviewed  CBC WITH DIFFERENTIAL/PLATELET - Abnormal; Notable for the following components:      Result Value   RBC 3.81 (*)    Hemoglobin 10.5 (*)    HCT 33.5 (*)    All other components within normal limits  COMPREHENSIVE METABOLIC PANEL - Abnormal; Notable for the following components:   Sodium 133 (*)    CO2 21 (*)    Calcium 8.6 (*)    Albumin 3.4 (*)    All other components within normal limits  BLOOD GAS, VENOUS - Abnormal; Notable for the following components:   pCO2, Ven 42 (*)    pO2, Ven 58.0 (*)  All other components within normal limits  RESP PANEL BY RT-PCR (FLU A&B, COVID) ARPGX2  CULTURE, BLOOD (ROUTINE X 2)  CULTURE, BLOOD (ROUTINE X 2)  PROCALCITONIN  BRAIN NATRIURETIC PEPTIDE  HIV ANTIBODY (ROUTINE TESTING W REFLEX)  TROPONIN I (HIGH SENSITIVITY)  TROPONIN I (HIGH SENSITIVITY)     EKG  Sinus tachycardia, ventricular 145.  PR 120, QRS 88, QTc 443.  Repull abnormality.  Nonspecific rate related changes.   RADIOLOGY Chest x-ray: No acute disease, COPD, reviewed by me as well as radiologist and I agree with interpretation    PROCEDURES:  Critical Care performed: Yes, see critical care procedure note(s)  .Critical Care Performed by: Duffy Bruce, MD Authorized by: Duffy Bruce, MD   Critical care provider statement:    Critical care time (minutes):  30   Critical care time was exclusive of:  Separately billable procedures and treating other  patients   Critical care was necessary to treat or prevent imminent or life-threatening deterioration of the following conditions:  Circulatory failure, cardiac failure and respiratory failure   Critical care was time spent personally by me on the following activities:  Development of treatment plan with patient or surrogate, discussions with consultants, evaluation of patient's response to treatment, examination of patient, ordering and review of laboratory studies, ordering and review of radiographic studies, ordering and performing treatments and interventions, pulse oximetry, re-evaluation of patient's condition and review of old charts .1-3 Lead EKG Interpretation Performed by: Duffy Bruce, MD Authorized by: Duffy Bruce, MD     Interpretation: abnormal     ECG rate:  100-1 40   ECG rate assessment: tachycardic     Rhythm: sinus tachycardia     Ectopy: none     Conduction: normal   Comments:     Indication: Shortness of breath   MEDICATIONS ORDERED IN ED: Medications  nicotine (NICODERM CQ - dosed in mg/24 hours) patch 21 mg (21 mg Transdermal Patch Applied 11/20/21 1434)  predniSONE (DELTASONE) tablet 40 mg (has no administration in time range)  umeclidinium-vilanterol (ANORO ELLIPTA) 62.5-25 MCG/ACT 1 puff (1 puff Inhalation Not Given 11/20/21 1437)  enoxaparin (LOVENOX) injection 40 mg (has no administration in time range)  albuterol (PROVENTIL) (2.5 MG/3ML) 0.083% nebulizer solution 2.5 mg (has no administration in time range)  ALPRAZolam (XANAX) tablet 1 mg (has no administration in time range)  amLODipine (NORVASC) tablet 5 mg (5 mg Oral Given 11/20/21 1434)  atorvastatin (LIPITOR) tablet 10 mg (has no administration in time range)  buPROPion (WELLBUTRIN SR) 12 hr tablet 150 mg (150 mg Oral Given 11/20/21 1436)  clopidogrel (PLAVIX) tablet 75 mg (75 mg Oral Given 11/20/21 1434)  escitalopram (LEXAPRO) tablet 10 mg (10 mg Oral Given 11/20/21 1435)  levothyroxine (SYNTHROID) tablet  88 mcg (has no administration in time range)  montelukast (SINGULAIR) tablet 10 mg (10 mg Oral Given 11/20/21 1435)  oxyCODONE (Oxy IR/ROXICODONE) immediate release tablet 5 mg (has no administration in time range)  oxyCODONE (Oxy IR/ROXICODONE) immediate release tablet 10 mg (has no administration in time range)  traZODone (DESYREL) tablet 300 mg (has no administration in time range)  ALPRAZolam (XANAX) tablet 1 mg (1 mg Oral Given 11/20/21 0938)  ipratropium-albuterol (DUONEB) 0.5-2.5 (3) MG/3ML nebulizer solution 3 mL (3 mLs Nebulization Given 11/20/21 0942)  albuterol (PROVENTIL) (2.5 MG/3ML) 0.083% nebulizer solution 2.5 mg (2.5 mg Nebulization Given 11/20/21 0942)  albuterol (PROVENTIL) (2.5 MG/3ML) 0.083% nebulizer solution 2.5 mg (2.5 mg Nebulization Given 11/20/21 0942)  magnesium sulfate IVPB 2 g 50  mL (0 g Intravenous Stopped 11/20/21 1111)  sodium chloride 0.9 % bolus 500 mL (0 mLs Intravenous Stopped 11/20/21 1208)  cefTRIAXone (ROCEPHIN) 2 g in sodium chloride 0.9 % 100 mL IVPB (0 g Intravenous Stopped 11/20/21 1102)  azithromycin (ZITHROMAX) 500 mg in sodium chloride 0.9 % 250 mL IVPB (0 mg Intravenous Stopped 11/20/21 1208)  albuterol (PROVENTIL) (2.5 MG/3ML) 0.083% nebulizer solution 2.5 mg (2.5 mg Nebulization Given 11/20/21 1138)  oxyCODONE-acetaminophen (PERCOCET/ROXICET) 5-325 MG per tablet 2 tablet (2 tablets Oral Given 11/20/21 1138)  lactated ringers infusion ( Intravenous New Bag/Given 11/20/21 1432)     IMPRESSION / MDM / [place] / ED COURSE  I reviewed the triage vital signs and the nursing notes.                              Differential diagnosis includes, but is not limited to, COPD exacerbation, benzodiazepine withdrawal, pneumonia, CHF.  The patient is on the cardiac monitor to evaluate for evidence of arrhythmia and/or significant heart rate changes.  76 year old female with extensive past medical history including COPD, chronic polysubstance dependence, here with  shortness of breath and increased work of breathing.  Patient in moderate respiratory distress and hypoxic on arrival.  This seems to improved with breathing treatments and oxygen.  Chest x-ray shows no acute disease and is consistent with COPD.  No pneumothorax.  Blood gases overall reassuring.  Procalcitonin negative, making significant pneumonia less likely.  CBC without leukocytosis.  Anemia is at baseline.  Troponin negative, EKG nonischemic, no signs of ACS.  I reviewed patient's previous ED visits and PCP visits, she has fairly extensive COPD.  She also is chronically dependent on benzodiazepines and reports that she had run out of her Xanax, so there could be a component of benzodiazepine withdrawal as well.  She had marked improvement when given her Xanax here.  Will admit for respiratory failure secondary to COPD with possible additional component of benzodiazepine withdrawal.  Patient updated and in agreement.  Discussed with the hospitalist Dr. Sheppard Coil who will admit.  Patient given IV steroids, IV fluids, and 3 DuoNeb's in addition to albuterol treatments for her significant increased work of breathing and hypoxia      FINAL CLINICAL IMPRESSION(S) / ED DIAGNOSES   Final diagnoses:  COPD exacerbation ([place])  Benzodiazepine withdrawal without complication ([place])  Acute respiratory failure with hypoxemia ([place])     Rx / DC Orders   ED Discharge Orders     None        Note:  This document was prepared using Dragon voice recognition software and may include unintentional dictation errors.   Duffy Bruce, MD 11/20/21 [phone]

## 2021-11-20 NOTE — Assessment & Plan Note (Signed)
Continue O2 via [place], BiPap ordered PRN, continue neb treatments w/ duonebs PRN but hopefully restarting inhalers will be sufficient, continue steroids and plan d/c on po steroids and on home inhalers, TOC consulted to help w/ med assistance so she won't run out of inhalers especially. Frequent visits for COPD.

## 2021-11-20 NOTE — Assessment & Plan Note (Signed)
Likely d/t no po intake about 36 hours hours, will trend in AM

## 2021-11-20 NOTE — Assessment & Plan Note (Signed)
Reports she takes Xanax with her inhalers at home. Seems to have decent insight that this is possibly a placebo effect benefit but still feels very anxious without this Rx. At this point certainly dependent and will follow outpatient to address anxiety and continue meds

## 2021-11-20 NOTE — Assessment & Plan Note (Signed)
-   Continue home meds °

## 2021-11-20 NOTE — Assessment & Plan Note (Signed)
Consider short term Rx on discharge until she can continue w/ usual prescriber

## 2021-11-20 NOTE — ED Triage Notes (Signed)
Pt to ED emergency traffic [facility] from home Pt ran out of Xanax and inhalers 2 days ago Pt was 86% on RA, pt restless and agitated, arguing with paramedics 125 solu medrol,2 duonebs and 2 albuterol treatments. Upper wheezing, lower rhonchi, tripod positioning upon EMS arrival   ETCO@ is was 29, now 35, CBG 127 176/84,  20g IV L AC by EMS  Pt has had other recent hospitalizations for COPD exascerbations Pt is extremely anxious, voice raised, states needs Xanax, states "the problem is I'm dehydrated"  EDP and RT at bedside

## 2021-11-20 NOTE — Assessment & Plan Note (Signed)
Encourage po nutrition and increase protein intake, albumin better today than in the past

## 2021-11-20 NOTE — H&P (Signed)
[name] is an 76 y.o. female.   Chief Complaint:  Chief Complaint  Patient presents with   Respiratory Distress   Anxiety    HPI:  Presented via EMS w/ SOB and anxiety (pt states out of inhalers and Xanax x 2 days), pt also c/o dehydration (states no food/drink since yesterday d/t issues with her dishwasher). Reports feeling better since receiving O2 and Xanax, requesting something to eat and requesting IV fluids for her dehydration. Denies recent illness, new cough, new fever/chills. Reports her usual aches/pains are stable and also improved since coming in to ED.   Hospital Course:  11/20/21 presented to ED. Per EMS, SaO2 RA 86% and tripod posturing, improved on supplemental O2. Received SoluMedrol 125 mg, 2 Duoneb tc, 2 albuterol tx. NEG Flu/COVID. Procalcitonin WNL, troponin flat and WNL x2, mild hyponatremia 133, mild ypocalcemia chronic, mild hypoalbuminemia chronic, mild normocytic anemia chronic. CXR no acute disease and (+)COPD. Will keep for observation and treatment. Will continue home meds to avoid withdrawal. TOC consulted for medication assistance w/ her inhalers. Plan to d/c on home inhalers, home ASA/Plavix for hx PAD, consider limited Rx benzo/opiate until she can arrange follow up as outpatient.   PENDING AS OF TIME OF ADMISSION:  BNP Blood cultures     ASSESSMENT/PLAN:   COPD exacerbation (HCC) Continue O2 via [place], BiPap ordered PRN, continue neb treatments w/ duonebs PRN but hopefully restarting inhalers will be sufficient, continue steroids and plan d/c on po steroids and on home inhalers, TOC consulted to help w/ med assistance so she won't run out of inhalers especially. Frequent visits for COPD.   Benzodiazepine dependence, continuous ([place]) Reports she takes Xanax with her inhalers at home. Seems to have decent insight that this is possibly a placebo effect benefit but still feels very anxious without this Rx. At this point certainly dependent and will follow  outpatient to address anxiety and continue meds   Anxiety and depression Stable since restarted Xaanx, will continue other home meds and f/u outpatient  Anemia Stable, trend CBC, not adherent to home ASA/Plavix for PAD but no s/s bleeding now, trend AM CBC   Hypoalbuminemia due to protein-calorie malnutrition (HCC) Encourage po nutrition and increase protein intake, albumin better today than in the past   Hypocalcemia Stable, follow up outpatient to monitor  Hyponatremia Likely d/t no po intake about 36 hours hours, will trend in AM  Hypothyroidism Continue home meds  Long term prescription opiate use Consider short term Rx on discharge until she can continue w/ usual prescriber   Peripheral vascular disease ([place]) On VTE Ppx while here, restart Plavix outpatient   VTE Ppx: lovenox CODE: FULL Dispo: hopefully home tomorrow 11/21/21 pending clinical improvement and stable oxygenation     Past Medical History:  Diagnosis Date   Anxiety    CAD (coronary artery disease)    Carpal tunnel syndrome    Cataract    Complication of anesthesia    has woken up at times   COPD (chronic obstructive pulmonary disease) ([place])    CRP elevated 09/14/2015   Depression    Difficult intubation    Dyslipidemia    Dyspnea    DOE   Dysrhythmia    hx palpatations   Elevated sedimentation rate 09/14/2015   Esophageal spasm    Gastrointestinal parasites    GERD (gastroesophageal reflux disease)    HCAP (healthcare-associated pneumonia) 10/22/2018   Hiatal hernia    History of peptic ulcer disease    Hypertension  Hyperthyroidism    Hypothyroidism    Low magnesium levels 09/14/2015   Pelvic fracture ([place]) 2008   fall from riding a horse   Peripheral vascular disease ([place])    Reflux    Rotator cuff injury    Sepsis ([place]) 10/22/2018   Stenosis, spinal, lumbar     Past Surgical History:  Procedure Laterality Date   AMPUTATION TOE Right 12/26/2020   Procedure: AMPUTATION TOE-3rd  Toes;  Surgeon: Samara Deist, DPM;  Location: [facility];  Service: Podiatry;  Laterality: Right;   AMPUTATION TOE Right 04/22/2021   Procedure: AMPUTATION TOE- RIGHT 2ND;  Surgeon: Samara Deist, DPM;  Location: [facility];  Service: Podiatry;  Laterality: Right;   AMPUTATION TOE Right 10/21/2021   Procedure: AMPUTATION TOE;  Surgeon: Caroline More, DPM;  Location: [facility];  Service: Podiatry;  Laterality: Right;   APPENDECTOMY     BACK SURGERY     CERVICAL FUSION   CARPAL TUNNEL RELEASE     CATARACT EXTRACTION W/PHACO Right 08/07/2017   Procedure: CATARACT EXTRACTION PHACO AND INTRAOCULAR LENS PLACEMENT ([place]);  Surgeon: Birder Robson, MD;  Location: [facility];  Service: Ophthalmology;  Laterality: Right;  [place] 00:52.0 AP% 16.8 CDE 8.74 Fluid Pack Lot # [id number]   CATARACT EXTRACTION W/PHACO Left 09/04/2017   Procedure: CATARACT EXTRACTION PHACO AND INTRAOCULAR LENS PLACEMENT ([place]);  Surgeon: Birder Robson, MD;  Location: [facility];  Service: Ophthalmology;  Laterality: Left;  [place] 00:34 AP% 17.0 CDE 5.80 Fluid pack lot # [id number]   CESAREAN SECTION     CHOLECYSTECTOMY     FRACTURE SURGERY     HIP SURGERY     INTRAMEDULLARY (IM) NAIL INTERTROCHANTERIC Right 10/01/2016   Procedure: INTRAMEDULLARY (IM) NAIL INTERTROCHANTRIC;  Surgeon: Corky Mull, MD;  Location: [facility];  Service: Orthopedics;  Laterality: Right;   INTRAMEDULLARY (IM) NAIL INTERTROCHANTERIC Left 01/14/2021   Procedure: INTRAMEDULLARY (IM) NAIL INTERTROCHANTRIC;  Surgeon: Lovell Sheehan, MD;  Location: [facility];  Service: Orthopedics;  Laterality: Left;   KYPHOPLASTY N/A 08/20/2018   Procedure: [id number];  Surgeon: Hessie Knows, MD;  Location: [facility];  Service: Orthopedics;  Laterality: N/A;   LOWER EXTREMITY ANGIOGRAPHY Right 12/18/2019   Procedure: LOWER EXTREMITY ANGIOGRAPHY;  Surgeon: Algernon Huxley, MD;  Location: [facility];  Service: Cardiovascular;  Laterality: Right;   LOWER EXTREMITY ANGIOGRAPHY  Right 12/02/2020   Procedure: LOWER EXTREMITY ANGIOGRAPHY;  Surgeon: Algernon Huxley, MD;  Location: [facility] [facility];  Service: Cardiovascular;  Laterality: Right;   LOWER EXTREMITY ANGIOGRAPHY Right 12/23/2020   Procedure: Lower Extremity Angiography;  Surgeon: Algernon Huxley, MD;  Location: [facility] [place] [facility];  Service: Cardiovascular;  Laterality: Right;   LOWER EXTREMITY ANGIOGRAPHY Right 12/24/2020   Procedure: Lower Extremity Angiography;  Surgeon: Algernon Huxley, MD;  Location: [facility];  Service: Cardiovascular;  Laterality: Right;   LOWER EXTREMITY ANGIOGRAPHY Right 10/20/2021   Procedure: Lower Extremity Angiography;  Surgeon: Algernon Huxley, MD;  Location: [place] [facility];  Service: Cardiovascular;  Laterality: Right;   NECK SURGERY     NOSE SURGERY     ROTATOR CUFF REPAIR     x2   SHOULDER ARTHROSCOPY  12/07/2011   Procedure: ARTHROSCOPY SHOULDER;  Surgeon: Ninetta Lights, MD;  Location: [facility];  Service: Orthopedics;  Laterality: Right;  Debridement Partial Cuff Tear, Release Coracoacromial Ligament   SHOULDER SURGERY  12/07/2011   right    Family History  Problem Relation Age of  Onset   Aneurysm Other    Social History:  reports that she has been smoking cigarettes. She has never used smokeless tobacco. She reports that she does not drink alcohol and does not use drugs.  Allergies: No Known Allergies  (Not in a hospital admission)   Results for orders placed or performed during the hospital encounter of 11/20/21 (from the past 48 hour(s))  Blood gas, venous     Status: Abnormal   Collection Time: 11/20/21  9:24 AM  Result Value Ref Range   pH, Ven 7.38 7.250 - 7.430   pCO2, Ven 42 (L) 44.0 - 60.0 mmHg   pO2, Ven 58.0 (H) 32.0 - 45.0 mmHg   Bicarbonate 24.8 20.0 - 28.0 mmol/L   Acid-base deficit 0.4 0.0 - 2.0 mmol/L   O2 Saturation 89.1 %   Patient temperature 37.0    Collection site VEIN    Sample type VENIPUNCTURE     Comment:  Performed at [facility], [address] [place], [place] [address]  CBC with Differential     Status: Abnormal   Collection Time: 11/20/21  9:31 AM  Result Value Ref Range   WBC 8.7 4.0 - 10.5 K/uL   RBC 3.81 (L) 3.87 - 5.11 MIL/uL   Hemoglobin 10.5 (L) 12.0 - 15.0 g/dL   HCT 33.5 (L) 36.0 - 46.0 %   MCV 87.9 80.0 - 100.0 fL   MCH 27.6 26.0 - 34.0 pg   MCHC 31.3 30.0 - 36.0 g/dL   RDW 14.6 11.5 - 15.5 %   Platelets 369 150 - 400 K/uL   nRBC 0.0 0.0 - 0.2 %   Neutrophils Relative % 74 %   Neutro Abs 6.4 1.7 - 7.7 K/uL   Lymphocytes Relative 18 %   Lymphs Abs 1.6 0.7 - 4.0 K/uL   Monocytes Relative 5 %   Monocytes Absolute 0.4 0.1 - 1.0 K/uL   Eosinophils Relative 3 %   Eosinophils Absolute 0.2 0.0 - 0.5 K/uL   Basophils Relative 0 %   Basophils Absolute 0.0 0.0 - 0.1 K/uL   Immature Granulocytes 0 %   Abs Immature Granulocytes 0.03 0.00 - 0.07 K/uL    Comment: Performed at [facility], [address] [place], [place] [address]  Comprehensive metabolic panel     Status: Abnormal   Collection Time: 11/20/21  9:31 AM  Result Value Ref Range   Sodium 133 (L) 135 - 145 mmol/L   Potassium 4.4 3.5 - 5.1 mmol/L    Comment: HEMOLYSIS AT THIS LEVEL MAY AFFECT RESULT   Chloride 101 98 - 111 mmol/L   CO2 21 (L) 22 - 32 mmol/L   Glucose, Bld 84 70 - 99 mg/dL    Comment: Glucose reference range applies only to samples taken after fasting for at least 8 hours.   BUN 11 8 - 23 mg/dL   Creatinine, Ser 0.57 0.44 - 1.00 mg/dL   Calcium 8.6 (L) 8.9 - 10.3 mg/dL   Total Protein 7.3 6.5 - 8.1 g/dL   Albumin 3.4 (L) 3.5 - 5.0 g/dL   AST 37 15 - 41 U/L    Comment: HEMOLYSIS AT THIS LEVEL MAY AFFECT RESULT   ALT 13 0 - 44 U/L   Alkaline Phosphatase 66 38 - 126 U/L   Total Bilirubin 1.2 0.3 - 1.2 mg/dL    Comment: HEMOLYSIS AT THIS LEVEL MAY AFFECT RESULT   GFR, Estimated >60 >60 mL/min    Comment: (NOTE) Calculated using  the CKD-EPI Creatinine Equation (2021)     Anion gap 11 5 - 15    Comment: Performed at [facility], [place], [place] [address]  Troponin I (High Sensitivity)     Status: None   Collection Time: 11/20/21  9:31 AM  Result Value Ref Range   Troponin I (High Sensitivity) 8 <18 ng/L    Comment: (NOTE) Elevated high sensitivity troponin I (hsTnI) values and significant  changes across serial measurements may suggest ACS but many other  chronic and acute conditions are known to elevate hsTnI results.  Refer to the "Links" section for chest pain algorithms and additional  guidance. Performed at [facility], [address] [place], [place] [address]   Resp Panel by RT-PCR (Flu A&B, Covid) Nasopharyngeal Swab     Status: None   Collection Time: 11/20/21  9:31 AM   Specimen: Nasopharyngeal Swab; Nasopharyngeal(NP) swabs in vial transport medium  Result Value Ref Range   SARS Coronavirus 2 by RT PCR NEGATIVE NEGATIVE    Comment: (NOTE) SARS-CoV-2 target nucleic acids are NOT DETECTED.  The SARS-CoV-2 RNA is generally detectable in upper respiratory specimens during the acute phase of infection. The lowest concentration of SARS-CoV-2 viral copies this assay can detect is 138 copies/mL. A negative result does not preclude SARS-Cov-2 infection and should not be used as the sole basis for treatment or other patient management decisions. A negative result may occur with  improper specimen collection/handling, submission of specimen other than nasopharyngeal swab, presence of viral mutation(s) within the areas targeted by this assay, and inadequate number of viral copies(<138 copies/mL). A negative result must be combined with clinical observations, patient history, and epidemiological information. The expected result is Negative.  Fact Sheet for Patients:  EntrepreneurPulse.com.au  Fact Sheet for Healthcare Providers:  IncredibleEmployment.be  This test is no  t yet approved or cleared by the [place] FDA and  has been authorized for detection and/or diagnosis of SARS-CoV-2 by FDA under an Emergency Use Authorization (EUA). This EUA will remain  in effect (meaning this test can be used) for the duration of the COVID-19 declaration under Section 564(b)(1) of the Act, 21 U.S.C.section 360bbb-3(b)(1), unless the authorization is terminated  or revoked sooner.       Influenza A by PCR NEGATIVE NEGATIVE   Influenza B by PCR NEGATIVE NEGATIVE    Comment: (NOTE) The Xpert Xpress SARS-CoV-2/FLU/RSV plus assay is intended as an aid in the diagnosis of influenza from Nasopharyngeal swab specimens and should not be used as a sole basis for treatment. Nasal washings and aspirates are unacceptable for Xpert Xpress SARS-CoV-2/FLU/RSV testing.  Fact Sheet for Patients: EntrepreneurPulse.com.au  Fact Sheet for Healthcare Providers: IncredibleEmployment.be  This test is not yet approved or cleared by the [place] FDA and has been authorized for detection and/or diagnosis of SARS-CoV-2 by FDA under an Emergency Use Authorization (EUA). This EUA will remain in effect (meaning this test can be used) for the duration of the COVID-19 declaration under Section 564(b)(1) of the Act, 21 U.S.C. section 360bbb-3(b)(1), unless the authorization is terminated or revoked.  Performed at [facility], [address] [place], [place] [address]   Procalcitonin - Baseline     Status: None   Collection Time: 11/20/21  9:31 AM  Result Value Ref Range   Procalcitonin <0.10 ng/mL    Comment:        Interpretation: PCT (Procalcitonin) <= 0.5 ng/mL: Systemic infection (sepsis) is not likely. Local  bacterial infection is possible. (NOTE)       Sepsis PCT Algorithm           Lower Respiratory Tract                                      Infection PCT Algorithm    ----------------------------      ----------------------------         PCT < 0.25 ng/mL                PCT < 0.10 ng/mL          Strongly encourage             Strongly discourage   discontinuation of antibiotics    initiation of antibiotics    ----------------------------     -----------------------------       PCT 0.25 - 0.50 ng/mL            PCT 0.10 - 0.25 ng/mL               OR       >80% decrease in PCT            Discourage initiation of                                            antibiotics      Encourage discontinuation           of antibiotics    ----------------------------     -----------------------------         PCT >= 0.50 ng/mL              PCT 0.26 - 0.50 ng/mL               AND        <80% decrease in PCT             Encourage initiation of                                             antibiotics       Encourage continuation           of antibiotics    ----------------------------     -----------------------------        PCT >= 0.50 ng/mL                  PCT > 0.50 ng/mL               AND         increase in PCT                  Strongly encourage                                      initiation of antibiotics    Strongly encourage escalation           of antibiotics                                     -----------------------------  PCT <= 0.25 ng/mL                                                 OR                                        > 80% decrease in PCT                                      Discontinue / Do not initiate                                             antibiotics  Performed at [facility], [address] [place], [place] [address]   Troponin I (High Sensitivity)     Status: None   Collection Time: 11/20/21  9:32 AM  Result Value Ref Range   Troponin I (High Sensitivity) 9 <18 ng/L    Comment: (NOTE) Elevated high sensitivity troponin I (hsTnI) values and significant  changes across serial measurements may suggest ACS but  many other  chronic and acute conditions are known to elevate hsTnI results.  Refer to the "Links" section for chest pain algorithms and additional  guidance. Performed at [facility], [address] [place], [place] [address]    DG Chest Portable 1 View  Result Date: 11/20/2021 CLINICAL DATA:  Pt to ED emergency traffic [facility] from home Pt ran out of Xanax and inhalers 2 days ago History of CAD, COPD, hypertension, smoker EXAM: PORTABLE CHEST 1 VIEW COMPARISON:  10/18/2021 FINDINGS: Cardiac silhouette normal in size. Normal mediastinal and hilar contours. Lungs are hyperexpanded with thickened interstitial markings, stable. No evidence of pneumonia or pulmonary edema. No pleural effusion or pneumothorax. Skeletal structures are demineralized.  Prior cervical spine fusion. IMPRESSION: 1. No acute cardiopulmonary disease. 2. COPD. Electronically Signed   By: Lajean Manes M.D.   On: 11/20/2021 10:08    Review of Systems  Constitutional:  Negative for fatigue and fever.  HENT:  Negative for trouble swallowing.   Respiratory:  Positive for cough, chest tightness, shortness of breath and wheezing.   Cardiovascular:  Negative for chest pain, palpitations and leg swelling.  Gastrointestinal:  Negative for abdominal pain and blood in stool.  Genitourinary:  Negative for dysuria and frequency.  Musculoskeletal:  Positive for arthralgias.  Neurological:  Negative for dizziness, syncope and light-headedness.  Psychiatric/Behavioral:  Positive for agitation. Negative for self-injury. The patient is nervous/anxious.    Blood pressure 127/71, pulse (!) 127, resp. rate (!) 22, height 5\' 4"  (1.626 m), weight 60.8 kg, SpO2 95 %. Physical Exam Constitutional:      General: She is not in acute distress.    Appearance: Normal appearance.  HENT:     Head: Normocephalic and atraumatic.  Eyes:     Extraocular Movements: Extraocular movements intact.     Conjunctiva/sclera: Conjunctivae normal.   Cardiovascular:     Rate and Rhythm: Normal rate and regular rhythm.     Heart sounds: No murmur heard.   No gallop.  Pulmonary:  Effort: Pulmonary effort is normal. No respiratory distress.     Breath sounds: Wheezing and rhonchi present.  Abdominal:     Palpations: Abdomen is soft.  Musculoskeletal:        General: No swelling or deformity.     Cervical back: Normal range of motion and neck supple.     Right lower leg: No edema.     Left lower leg: No edema.  Skin:    General: Skin is warm and dry.     Coloration: Skin is not pale.  Neurological:     General: No focal deficit present.     Mental Status: She is alert and oriented to person, place, and time.  Psychiatric:        Mood and Affect: Mood normal.     Emeterio Reeve, DO 11/20/2021, 1:05 PM

## 2021-11-21 DIAGNOSIS — M1990 Unspecified osteoarthritis, unspecified site: Secondary | ICD-10-CM | POA: Diagnosis present

## 2021-11-21 DIAGNOSIS — T8132XA Disruption of internal operation (surgical) wound, not elsewhere classified, initial encounter: Secondary | ICD-10-CM | POA: Diagnosis present

## 2021-11-21 DIAGNOSIS — J441 Chronic obstructive pulmonary disease with (acute) exacerbation: Secondary | ICD-10-CM | POA: Diagnosis present

## 2021-11-21 DIAGNOSIS — F13239 Sedative, hypnotic or anxiolytic dependence with withdrawal, unspecified: Secondary | ICD-10-CM | POA: Diagnosis present

## 2021-11-21 DIAGNOSIS — Z961 Presence of intraocular lens: Secondary | ICD-10-CM | POA: Diagnosis present

## 2021-11-21 DIAGNOSIS — E871 Hypo-osmolality and hyponatremia: Secondary | ICD-10-CM | POA: Diagnosis present

## 2021-11-21 DIAGNOSIS — D509 Iron deficiency anemia, unspecified: Secondary | ICD-10-CM | POA: Diagnosis present

## 2021-11-21 DIAGNOSIS — I739 Peripheral vascular disease, unspecified: Secondary | ICD-10-CM | POA: Diagnosis present

## 2021-11-21 DIAGNOSIS — E43 Unspecified severe protein-calorie malnutrition: Secondary | ICD-10-CM | POA: Insufficient documentation

## 2021-11-21 DIAGNOSIS — E039 Hypothyroidism, unspecified: Secondary | ICD-10-CM | POA: Diagnosis present

## 2021-11-21 DIAGNOSIS — I2510 Atherosclerotic heart disease of native coronary artery without angina pectoris: Secondary | ICD-10-CM | POA: Diagnosis present

## 2021-11-21 DIAGNOSIS — Y835 Amputation of limb(s) as the cause of abnormal reaction of the patient, or of later complication, without mention of misadventure at the time of the procedure: Secondary | ICD-10-CM | POA: Diagnosis present

## 2021-11-21 DIAGNOSIS — K219 Gastro-esophageal reflux disease without esophagitis: Secondary | ICD-10-CM | POA: Diagnosis present

## 2021-11-21 DIAGNOSIS — E8809 Other disorders of plasma-protein metabolism, not elsewhere classified: Secondary | ICD-10-CM | POA: Diagnosis present

## 2021-11-21 DIAGNOSIS — F17210 Nicotine dependence, cigarettes, uncomplicated: Secondary | ICD-10-CM | POA: Diagnosis present

## 2021-11-21 DIAGNOSIS — J9601 Acute respiratory failure with hypoxia: Secondary | ICD-10-CM | POA: Diagnosis present

## 2021-11-21 DIAGNOSIS — Z20822 Contact with and (suspected) exposure to covid-19: Secondary | ICD-10-CM | POA: Diagnosis present

## 2021-11-21 DIAGNOSIS — F419 Anxiety disorder, unspecified: Secondary | ICD-10-CM | POA: Diagnosis present

## 2021-11-21 DIAGNOSIS — J432 Centrilobular emphysema: Secondary | ICD-10-CM | POA: Diagnosis present

## 2021-11-21 DIAGNOSIS — G8929 Other chronic pain: Secondary | ICD-10-CM | POA: Diagnosis present

## 2021-11-21 DIAGNOSIS — F1320 Sedative, hypnotic or anxiolytic dependence, uncomplicated: Secondary | ICD-10-CM | POA: Diagnosis not present

## 2021-11-21 DIAGNOSIS — I10 Essential (primary) hypertension: Secondary | ICD-10-CM | POA: Diagnosis present

## 2021-11-21 DIAGNOSIS — E860 Dehydration: Secondary | ICD-10-CM | POA: Diagnosis present

## 2021-11-21 DIAGNOSIS — M86271 Subacute osteomyelitis, right ankle and foot: Secondary | ICD-10-CM | POA: Diagnosis present

## 2021-11-21 DIAGNOSIS — M48061 Spinal stenosis, lumbar region without neurogenic claudication: Secondary | ICD-10-CM | POA: Diagnosis present

## 2021-11-21 DIAGNOSIS — E785 Hyperlipidemia, unspecified: Secondary | ICD-10-CM | POA: Diagnosis present

## 2021-11-21 LAB — IRON AND TIBC
Iron: 23 ug/dL — ABNORMAL LOW (ref 28–170)
Saturation Ratios: 6 % — ABNORMAL LOW (ref 10.4–31.8)
TIBC: 375 ug/dL (ref 250–450)
UIBC: 352 ug/dL

## 2021-11-21 LAB — RETICULOCYTES
Immature Retic Fract: 17.2 % — ABNORMAL HIGH (ref 2.3–15.9)
RBC.: 3.77 MIL/uL — ABNORMAL LOW (ref 3.87–5.11)
Retic Count, Absolute: 121 10*3/uL (ref 19.0–186.0)
Retic Ct Pct: 3.3 % — ABNORMAL HIGH (ref 0.4–3.1)

## 2021-11-21 LAB — FOLATE: Folate: 13.3 ng/mL (ref >5.9)

## 2021-11-21 LAB — BASIC METABOLIC PANEL
Anion gap: 4 — ABNORMAL LOW (ref 5–15)
BUN: 14 mg/dL (ref 8–23)
CO2: 27 mmol/L (ref 22–32)
Calcium: 8.3 mg/dL — ABNORMAL LOW (ref 8.9–10.3)
Chloride: 107 mmol/L (ref 98–111)
Creatinine, Ser: 0.54 mg/dL (ref 0.44–1.00)
GFR, Estimated: >60 mL/min (ref >60)
Glucose, Bld: 89 mg/dL (ref 70–99)
Potassium: 3.6 mmol/L (ref 3.5–5.1)
Sodium: 138 mmol/L (ref 135–145)

## 2021-11-21 LAB — HIV ANTIBODY (ROUTINE TESTING W REFLEX): HIV Screen 4th Generation wRfx: NONREACTIVE

## 2021-11-21 LAB — CBC
HCT: 30.3 % — ABNORMAL LOW (ref 36.0–46.0)
Hemoglobin: 9.5 g/dL — ABNORMAL LOW (ref 12.0–15.0)
MCH: 27.5 pg (ref 26.0–34.0)
MCHC: 31.4 g/dL (ref 30.0–36.0)
MCV: 87.6 fL (ref 80.0–100.0)
Platelets: 331 10*3/uL (ref 150–400)
RBC: 3.46 MIL/uL — ABNORMAL LOW (ref 3.87–5.11)
RDW: 14.6 % (ref 11.5–15.5)
WBC: 11.3 10*3/uL — ABNORMAL HIGH (ref 4.0–10.5)
nRBC: 0 % (ref 0.0–0.2)

## 2021-11-21 LAB — FERRITIN: Ferritin: 9 ng/mL — ABNORMAL LOW (ref 11–307)

## 2021-11-21 MED ORDER — CALCIUM CARBONATE ANTACID 500 MG PO CHEW
1.0000 | CHEWABLE_TABLET | Freq: Three times a day (TID) | ORAL | Status: DC
  Administered 2021-11-21 – 2021-11-25 (×9): 200 mg via ORAL
  Filled 2021-11-21 (×9): qty 1

## 2021-11-21 MED ORDER — ADULT MULTIVITAMIN W/MINERALS CH
1.0000 | ORAL_TABLET | Freq: Every day | ORAL | Status: DC
  Administered 2021-11-21 – 2021-11-25 (×5): 1 via ORAL
  Filled 2021-11-21 (×5): qty 1

## 2021-11-21 MED ORDER — SENNOSIDES-DOCUSATE SODIUM 8.6-50 MG PO TABS
2.0000 | ORAL_TABLET | Freq: Two times a day (BID) | ORAL | Status: DC
  Administered 2021-11-21 – 2021-11-25 (×9): 2 via ORAL
  Filled 2021-11-21 (×9): qty 2

## 2021-11-21 MED ORDER — POLYETHYLENE GLYCOL 3350 17 G PO PACK
17.0000 g | PACK | Freq: Two times a day (BID) | ORAL | Status: DC
  Administered 2021-11-21 – 2021-11-24 (×8): 17 g via ORAL
  Filled 2021-11-21 (×8): qty 1

## 2021-11-21 MED ORDER — ENSURE ENLIVE PO LIQD
237.0000 mL | Freq: Three times a day (TID) | ORAL | Status: DC
  Administered 2021-11-21 – 2021-11-24 (×7): 237 mL via ORAL

## 2021-11-21 MED ORDER — AZITHROMYCIN 500 MG PO TABS
500.0000 mg | ORAL_TABLET | Freq: Every day | ORAL | Status: AC
  Administered 2021-11-21 – 2021-11-22 (×2): 500 mg via ORAL
  Filled 2021-11-21 (×2): qty 1

## 2021-11-21 MED ORDER — PANTOPRAZOLE SODIUM 40 MG PO TBEC
40.0000 mg | DELAYED_RELEASE_TABLET | Freq: Every day | ORAL | Status: DC
  Administered 2021-11-21 – 2021-11-25 (×5): 40 mg via ORAL
  Filled 2021-11-21 (×5): qty 1

## 2021-11-21 MED ORDER — IPRATROPIUM-ALBUTEROL 0.5-2.5 (3) MG/3ML IN SOLN
3.0000 mL | RESPIRATORY_TRACT | Status: DC | PRN
  Administered 2021-11-24: 3 mL via RESPIRATORY_TRACT
  Filled 2021-11-21: qty 3

## 2021-11-21 NOTE — Progress Notes (Signed)
PROGRESS NOTE    [name]  CHE:[id number] DOB: Mar 09, 1946 DOA: 11/20/2021 PCP: Albina Billet, MD   Chief Complaint  Patient presents with   Respiratory Distress   Anxiety    Brief Narrative:   76 year old lady with prior h/o COPD, hypothyroidism Presented via EMS w/ SOB and anxiety (pt states out of inhalers and Xanax x 2 days), pt also c/o dehydration (states no food/drink since yesterday d/t issues with her dishwasher). She was admitted for copd exacerbation.   Assessment & Plan:   Principal Problem:   COPD exacerbation ([place]) Active Problems:   Hypothyroidism   Anxiety and depression   Long term prescription opiate use   Chronic pain - multiple sites arthritis   Benzodiazepine dependence, continuous (HCC)   Peripheral vascular disease (HCC)   Hyponatremia   Hypocalcemia   Hypoalbuminemia due to protein-calorie malnutrition (HCC)   Anemia  Acute COPD exacerbation;  Currently on oral prednisone, duo nebs,  TOC on board.    Benzo dependence:  Resume home xanax dose.    Anxiety and depression.  Restarted ho me meds.    Anemia:  Normocytic.    Hypothyroidism:  Resume synthroid.    Protein calorie malnutrition;  Dietary on board.    PVD:  Resume home meds on discharge.     DVT prophylaxis: (Lovenox/) Code Status: (Full/ code) Family Communication: None at bedside.  Disposition:   Status is: Observation  The patient will require care spanning > 2 midnights and should be moved to inpatient because: sob.       Consultants:  None.   Procedures: none.   Antimicrobials: none.    Subjective: Not feeling good, still sob.   Objective: Vitals:   11/20/21 2050 11/21/21 0038 11/21/21 0450 11/21/21 0826  BP: (!) 160/87 (!) 159/90 (!) 149/86 (!) 124/99  Pulse: (!) 114 99 89 92  Resp: 20 18 18 16   Temp: (!) 97.5 F (36.4 C) 98.1 F (36.7 C) 98.5 F (36.9 C) 97.7 F (36.5 C)  TempSrc: Oral Oral Oral Oral  SpO2: 100% 99% 100% 96%   Weight:      Height:        Intake/Output Summary (Last 24 hours) at 11/21/2021 0939 Last data filed at 11/21/2021 5361 Gross per 24 hour  Intake 659.27 ml  Output 1900 ml  Net -1240.73 ml   Filed Weights   11/20/21 0926  Weight: 60.8 kg    Examination:  General exam: Appears calm and comfortable  Respiratory system: scattered wheezing posteriorly, air entry fair. On 2 lit of [place] oxygen.  Cardiovascular system: S1 & S2 heard, RRR. No JVD, No pedal edema. Gastrointestinal system: Abdomen is nondistended, soft and nontender. No organomegaly or masses felt. Normal bowel sounds heard. Central nervous system: Alert and oriented. No focal neurological deficits. Extremities: right foot bandaged.  Skin: No rashes, lesions or ulcers Psychiatry:  Mood & affect appropriate.     Data Reviewed: I have personally reviewed following labs and imaging studies  CBC: Recent Labs  Lab 11/20/21 0931 11/21/21 0325  WBC 8.7 11.3*  NEUTROABS 6.4  --   HGB 10.5* 9.5*  HCT 33.5* 30.3*  MCV 87.9 87.6  PLT 369 443    Basic Metabolic Panel: Recent Labs  Lab 11/20/21 0931 11/21/21 0325  NA 133* 138  K 4.4 3.6  CL 101 107  CO2 21* 27  GLUCOSE 84 89  BUN 11 14  CREATININE 0.57 0.54  CALCIUM 8.6* 8.3*  GFR: Estimated Creatinine Clearance: 53.3 mL/min (by C-G formula based on SCr of 0.54 mg/dL).  Liver Function Tests: Recent Labs  Lab 11/20/21 0931  AST 37  ALT 13  ALKPHOS 66  BILITOT 1.2  PROT 7.3  ALBUMIN 3.4*    CBG: No results for input(s): GLUCAP in the last 168 hours.   Recent Results (from the past 240 hour(s))  Resp Panel by RT-PCR (Flu A&B, Covid) Nasopharyngeal Swab     Status: None   Collection Time: 11/20/21  9:31 AM   Specimen: Nasopharyngeal Swab; Nasopharyngeal(NP) swabs in vial transport medium  Result Value Ref Range Status   SARS Coronavirus 2 by RT PCR NEGATIVE NEGATIVE Final    Comment: (NOTE) SARS-CoV-2 target nucleic acids are NOT  DETECTED.  The SARS-CoV-2 RNA is generally detectable in upper respiratory specimens during the acute phase of infection. The lowest concentration of SARS-CoV-2 viral copies this assay can detect is 138 copies/mL. A negative result does not preclude SARS-Cov-2 infection and should not be used as the sole basis for treatment or other patient management decisions. A negative result may occur with  improper specimen collection/handling, submission of specimen other than nasopharyngeal swab, presence of viral mutation(s) within the areas targeted by this assay, and inadequate number of viral copies(<138 copies/mL). A negative result must be combined with clinical observations, patient history, and epidemiological information. The expected result is Negative.  Fact Sheet for Patients:  EntrepreneurPulse.com.au  Fact Sheet for Healthcare Providers:  IncredibleEmployment.be  This test is no t yet approved or cleared by the [place] FDA and  has been authorized for detection and/or diagnosis of SARS-CoV-2 by FDA under an Emergency Use Authorization (EUA). This EUA will remain  in effect (meaning this test can be used) for the duration of the COVID-19 declaration under Section 564(b)(1) of the Act, 21 U.S.C.section 360bbb-3(b)(1), unless the authorization is terminated  or revoked sooner.       Influenza A by PCR NEGATIVE NEGATIVE Final   Influenza B by PCR NEGATIVE NEGATIVE Final    Comment: (NOTE) The Xpert Xpress SARS-CoV-2/FLU/RSV plus assay is intended as an aid in the diagnosis of influenza from Nasopharyngeal swab specimens and should not be used as a sole basis for treatment. Nasal washings and aspirates are unacceptable for Xpert Xpress SARS-CoV-2/FLU/RSV testing.  Fact Sheet for Patients: EntrepreneurPulse.com.au  Fact Sheet for Healthcare Providers: IncredibleEmployment.be  This test is not yet  approved or cleared by the [place] FDA and has been authorized for detection and/or diagnosis of SARS-CoV-2 by FDA under an Emergency Use Authorization (EUA). This EUA will remain in effect (meaning this test can be used) for the duration of the COVID-19 declaration under Section 564(b)(1) of the Act, 21 U.S.C. section 360bbb-3(b)(1), unless the authorization is terminated or revoked.  Performed at [facility] [address], [place], [place] [address]          Radiology Studies: DG Chest Portable 1 View  Result Date: 11/20/2021 CLINICAL DATA:  Pt to ED emergency traffic [facility] from home Pt ran out of Xanax and inhalers 2 days ago History of CAD, COPD, hypertension, smoker EXAM: PORTABLE CHEST 1 VIEW COMPARISON:  10/18/2021 FINDINGS: Cardiac silhouette normal in size. Normal mediastinal and hilar contours. Lungs are hyperexpanded with thickened interstitial markings, stable. No evidence of pneumonia or pulmonary edema. No pleural effusion or pneumothorax. Skeletal structures are demineralized.  Prior cervical spine fusion. IMPRESSION: 1. No acute cardiopulmonary disease. 2. COPD. Electronically Signed   By: Shanon Brow  Ormond M.D.   On: 11/20/2021 10:08        Scheduled Meds:  amLODipine  5 mg Oral Daily   atorvastatin  10 mg Oral QPM   buPROPion  150 mg Oral Daily   clopidogrel  75 mg Oral Daily   enoxaparin (LOVENOX) injection  40 mg Subcutaneous Q24H   escitalopram  10 mg Oral Q1500   levothyroxine  88 mcg Oral QAC breakfast   montelukast  10 mg Oral Daily   predniSONE  40 mg Oral Q breakfast   umeclidinium-vilanterol  1 puff Inhalation Daily   Continuous Infusions:  lactated ringers Stopped (11/21/21 0327)     LOS: 0 days        Hosie Poisson, MD [facility]   To contact the attending provider between 7A-7P or the covering provider during after hours 7P-7A, please log into the web site [email or web address] and access using universal [place]  password for that web site. If you do not have the password, please call the hospital operator.  11/21/2021, 9:39 AM

## 2021-11-21 NOTE — Evaluation (Signed)
Physical Therapy Evaluation Patient Details Name: [name] MRN: [id number] DOB: 08-29-1946 Today's Date: 11/21/2021  History of Present Illness  76 y.o. female with extensive past medical history including chronic COPD, chronic pain, anxiety on benzodiazepines, chronic opioid dependence, recurrent UTIs, here with shortness of breath.  Patient arrives in moderate respiratory distress somewhat limiting history.  Per report, she has been progressively more short of breath for the last 2 days or so.  She feels like this is due to running out of her Xanax and feeling more anxious.  She also states she has had a cough with increasing sputum production.  This was initially yellow-green but is now clear.  Denies fevers.  Per EMS, when they arrived the patient was markedly anxious and hypoxic.  She has had difficulty tolerating breathing treatments due to her anxiety and she is requesting something for this.  Denies any known leg swelling.   Clinical Impression  Patient A&Ox4, denied pain. Did report pain in abdomen/feeling nauseous during session. Pt reported at baseline limited mobility;primarily tranfers from couch to [facility], can walk into her kitchen if she wants too, several falls recently. Does have an iade that comes "nearly every day" per pt.  Post op shoe donned prior to mobility. She demonstrated bed mobility independently, and sit <> stand with RW with supervision. Did have 1 posterior LOB that required minA to assist with safe lowering into recliner. She ambulated ~58ft with RW and CGA, no LOB noted, and requested to return to bed, supervision. The patient demonstrates near return to baseline level of functioning, but would benefit from HHPT to maximize function, safety, and independence.        Recommendations for follow up therapy are one component of a multi-disciplinary discharge planning process, led by the attending physician.  Recommendations may be updated based on patient status,  additional functional criteria and insurance authorization.  Follow Up Recommendations Home health PT    Assistance Recommended at Discharge Intermittent Supervision/Assistance  Functional Status Assessment Patient has had a recent decline in their functional status and demonstrates the ability to make significant improvements in function in a reasonable and predictable amount of time.  Equipment Recommendations  None recommended by PT    Recommendations for Other Services       Precautions / Restrictions Precautions Precautions: None Restrictions Other Position/Activity Restrictions: Pt post op R toe amputations ~ 1 month ago with order at that time stating need for post op shoe to be donned for OOB.      Mobility  Bed Mobility Overal bed mobility: Independent                  Transfers Overall transfer level: Needs assistance Equipment used: Rolling walker (2 wheels) Transfers: Sit to/from Stand;Bed to chair/wheelchair/BSC Sit to Stand: Supervision   Step pivot transfers: Supervision;Min assist       General transfer comment: EOB <> recliner    Ambulation/Gait Ambulation/Gait assistance: Min guard Gait Distance (Feet): 10 Feet Assistive device: Rolling walker (2 wheels)         General Gait Details: flat post op shoe donned prior to mobility  Stairs            Wheelchair Mobility    Modified Rankin (Stroke Patients Only)       Balance Overall balance assessment: Needs assistance Sitting-balance support: Feet supported Sitting balance-Leahy Scale: Normal     Standing balance support: Single extremity supported Standing balance-Leahy Scale: Fair Standing balance comment: improved stability with  BUE support                             Pertinent Vitals/Pain Pain Assessment: Faces Pain Score: 5  Faces Pain Scale: Hurts a little bit Pain Location: foot, abdomen Pain Descriptors / Indicators:  Discomfort;Grimacing;Moaning Pain Intervention(s): Limited activity within patient's tolerance;Premedicated before session;Repositioned    Home Living Family/patient expects to be discharged to:: Private residence Living Arrangements: Alone Available Help at Discharge: Family;Personal care attendant;Available PRN/intermittently Type of Home: House Home Access: Stairs to enter Entrance Stairs-Rails: Left Entrance Stairs-Number of Steps: 5   Home Layout: One level Home Equipment: Conservation officer, nature (2 wheels);Rollator (4 wheels);Shower seat Additional Comments: caregiver comes daily to assist with ADLs and IADLs . "Whatever I need help with"    Prior Function Prior Level of Function : Independent/Modified Independent             Mobility Comments: Ambulatory with rollator. Lives with dog. ADLs Comments: Modified indep with ADLs prior to admission.  Has small dog at home to care for.     Hand Dominance   Dominant Hand: Right    Extremity/Trunk Assessment   Upper Extremity Assessment Upper Extremity Assessment: Overall WFL for tasks assessed    Lower Extremity Assessment Lower Extremity Assessment: Overall WFL for tasks assessed    Cervical / Trunk Assessment Cervical / Trunk Assessment: Kyphotic  Communication   Communication: HOH  Cognition Arousal/Alertness: Awake/alert Behavior During Therapy: WFL for tasks assessed/performed Overall Cognitive Status: Within Functional Limits for tasks assessed                                          General Comments      Exercises     Assessment/Plan    PT Assessment Patient needs continued PT services  PT Problem List Decreased strength;Decreased range of motion;Decreased activity tolerance;Decreased balance;Decreased mobility;Decreased knowledge of use of DME       PT Treatment Interventions DME instruction;Therapeutic exercise;Gait training;Balance training;Stair training;Neuromuscular  re-education;Functional mobility training;Therapeutic activities;Patient/family education    PT Goals (Current goals can be found in the Care Plan section)  Acute Rehab PT Goals Patient Stated Goal: to go home PT Goal Formulation: With patient Time For Goal Achievement: 12/05/21 Potential to Achieve Goals: Good    Frequency Min 2X/week   Barriers to discharge        Co-evaluation               AM-PAC PT "6 Clicks" Mobility  Outcome Measure Help needed turning from your back to your side while in a flat bed without using bedrails?: None Help needed moving from lying on your back to sitting on the side of a flat bed without using bedrails?: None Help needed moving to and from a bed to a chair (including a wheelchair)?: None Help needed standing up from a chair using your arms (e.g., wheelchair or bedside chair)?: A Little Help needed to walk in hospital room?: A Little Help needed climbing 3-5 steps with a railing? : A Little 6 Click Score: 21    End of Session Equipment Utilized During Treatment: Gait belt Activity Tolerance: Patient tolerated treatment well Patient left: in bed;with call bell/phone within reach;with bed alarm set Nurse Communication: Mobility status PT Visit Diagnosis: Other abnormalities of gait and mobility (R26.89);Difficulty in walking, not elsewhere classified (R26.2);Muscle weakness (generalized) (  M62.81)    Time: 5726-2035 PT Time Calculation (min) (ACUTE ONLY): 18 min   Charges:     PT Treatments $Therapeutic Activity: 8-22 mins       Lieutenant Diego PT, DPT 3:41 PM,11/21/21

## 2021-11-21 NOTE — Evaluation (Signed)
Occupational Therapy Evaluation Patient Details Name: [name] MRN: [id number] DOB: [birth year] Today's Date: 11/21/2021   History of Present Illness 76 y.o. female with extensive past medical history including chronic COPD, chronic pain, anxiety on benzodiazepines, chronic opioid dependence, recurrent UTIs, here with shortness of breath.  Patient arrives in moderate respiratory distress somewhat limiting history.  Per report, she has been progressively more short of breath for the last 2 days or so.  She feels like this is due to running out of her Xanax and feeling more anxious.  She also states she has had a cough with increasing sputum production.  This was initially yellow-green but is now clear.  Denies fevers.  Per EMS, when they arrived the patient was markedly anxious and hypoxic.  She has had difficulty tolerating breathing treatments due to her anxiety and she is requesting something for this.  Denies any known leg swelling.   Clinical Impression   Patient presenting with decreased Ind in self care, balance, functional mobility/transfers,endurance, and safety awareness. Patient reports being mod I with use of rollator PTA. However, she does endorse having paid caregiver come several hours daily to assist with IADLs and ADLs if needed. She reports staying on couch after caregiver leaves and transfers to [facility] as needed. She does not drive. She should have post op shoe on R foot but not at hospital with pt reporting caregiver would be bring it later. Pt then performed bed mobility and standing without assistance with cuing to return to bed secondary to precautions. Pt may be close to baseline. Patient will benefit from acute OT to increase overall independence in the areas of ADLs, functional mobility, and safety awareness in order to safely discharge home.      Recommendations for follow up therapy are one component of a multi-disciplinary discharge planning process, led by the attending  physician.  Recommendations may be updated based on patient status, additional functional criteria and insurance authorization.   Follow Up Recommendations  Home health OT    Assistance Recommended at Discharge Intermittent Supervision/Assistance  Functional Status Assessment  Patient has had a recent decline in their functional status and demonstrates the ability to make significant improvements in function in a reasonable and predictable amount of time.  Equipment Recommendations  None recommended by OT       Precautions / Restrictions Precautions Precautions: None Restrictions Other Position/Activity Restrictions: Pt post op R toe amputations ~ 1 month ago with order at that time stating need for post op shoe to be donned for OOB.      Mobility Bed Mobility Overal bed mobility: Independent                  Transfers Overall transfer level: Needs assistance   Transfers: Sit to/from Stand Sit to Stand: Supervision                  Balance Overall balance assessment: Needs assistance Sitting-balance support: Feet supported Sitting balance-Leahy Scale: Normal     Standing balance support: Single extremity supported Standing balance-Leahy Scale: Fair                             ADL either performed or assessed with clinical judgement   ADL Overall ADL's : Needs assistance/impaired     Grooming: Wash/dry hands;Wash/dry face;Oral care;Sitting;Set up  Vision Patient Visual Report: No change from baseline              Pertinent Vitals/Pain Pain Assessment: 0-10 Pain Score: 5  Pain Descriptors / Indicators: Discomfort Pain Intervention(s): Limited activity within patient's tolerance;Premedicated before session;Monitored during session;Repositioned     Hand Dominance Right   Extremity/Trunk Assessment Upper Extremity Assessment Upper Extremity Assessment: Overall WFL for tasks  assessed   Lower Extremity Assessment Lower Extremity Assessment: Overall WFL for tasks assessed (R foot toe amputations ~ 1 month ago)       Communication Communication Communication: HOH   Cognition Arousal/Alertness: Awake/alert Behavior During Therapy: WFL for tasks assessed/performed Overall Cognitive Status: Within Functional Limits for tasks assessed                                                  Home Living Family/patient expects to be discharged to:: Private residence Living Arrangements: Alone Available Help at Discharge: Family;Personal care attendant;Available PRN/intermittently Type of Home: House Home Access: Stairs to enter [employer] of Steps: 5 Entrance Stairs-Rails: Left Home Layout: One level     Bathroom Shower/Tub: Walk-in shower;Tub/shower unit   Bathroom Toilet: Standard     Home Equipment: Conservation officer, nature (2 wheels);Rollator (4 wheels);Shower seat   Additional Comments: caregiver comes daily to assist with ADLs and IADLs . "Whatever I need help with"      Prior Functioning/Environment Prior Level of Function : Independent/Modified Independent             Mobility Comments: Ambulatory with rollator. Lives with dog. ADLs Comments: Modified indep with ADLs prior to admission.  Has small dog at home to care for.        OT Problem List: Decreased strength;Decreased activity tolerance;Impaired balance (sitting and/or standing);Decreased safety awareness;Decreased knowledge of use of DME or AE;Pain      OT Treatment/Interventions: Self-care/ADL training;Therapeutic exercise;Therapeutic activities;Cognitive remediation/compensation;Energy conservation;Manual therapy;Balance training;Patient/family education    OT Goals(Current goals can be found in the care plan section) Acute Rehab OT Goals Patient Stated Goal: to go home and resume therapy OT Goal Formulation: With patient Time For Goal Achievement:  12/05/21 Potential to Achieve Goals: Good ADL Goals Pt Will Perform Grooming: with modified independence;standing Pt Will Transfer to Toilet: with modified independence;stand pivot transfer Pt Will Perform Toileting - Clothing Manipulation and hygiene: with modified independence;sit to/from stand  OT Frequency: Min 2X/week   Barriers to D/C:    none known at this time          AM-PAC OT "6 Clicks" Daily Activity     Outcome Measure Help from another person eating meals?: None Help from another person taking care of personal grooming?: None Help from another person toileting, which includes using toliet, bedpan, or urinal?: A Little Help from another person bathing (including washing, rinsing, drying)?: A Little Help from another person to put on and taking off regular upper body clothing?: None Help from another person to put on and taking off regular lower body clothing?: A Little 6 Click Score: 21   End of Session Nurse Communication: Mobility status (on RA)  Activity Tolerance: Patient tolerated treatment well;Patient limited by fatigue Patient left: in bed;with call bell/phone within reach;with bed alarm set  OT Visit Diagnosis: Unsteadiness on feet (R26.81);Repeated falls (R29.6);Muscle weakness (generalized) (M62.81);History of falling (Z91.81)  Time: 1200-1219 OT Time Calculation (min): 19 min Charges:  OT General Charges $OT Visit: 1 Visit OT Evaluation $OT Eval Moderate Complexity: 1 Mod OT Treatments $Therapeutic Activity: 8-22 mins  Darleen Crocker, MS, OTR/L , CBIS ascom [phone]  11/21/21, 1:05 PM

## 2021-11-21 NOTE — TOC CM/SW Note (Signed)
CSW acknowledges consult for medication assistance for inhalers. Patient has insurance so unable to get prescriptions at [facility]. Notified pharmacist to see if there are any cheaper options for her.  Patient has active with [facility] for PT, OT, and RN.  Dayton Scrape, [place]

## 2021-11-21 NOTE — Progress Notes (Signed)
Initial Nutrition Assessment  DOCUMENTATION CODES:  Severe malnutrition in context of chronic illness  INTERVENTION:  Continue regular diet.  Add Ensure Enlive po TID, each supplement provides 350 kcal and 20 grams of protein.  Add MVI with minerals.  Recommend starting bowel regimen given no bowel movement in 8 days.  Obtain updated weight.  Encourage PO and supplement intake.  NUTRITION DIAGNOSIS:  Severe Malnutrition related to chronic illness (PVD, COPD) as evidenced by severe fat depletion, severe muscle depletion.  GOAL:  Patient will meet greater than or equal to 90% of their needs  MONITOR:  PO intake, Supplement acceptance, Labs, Weight trends, I & O's, Skin  REASON FOR ASSESSMENT:  Consult, Malnutrition Screening Tool Assessment of nutrition requirement/status  ASSESSMENT:  76 yo female with a PMH of COPD, SOB, PVD, GERD, s/p toe amputations, and anxiety (pt states out of inhalers and Xanax x 2 days), pt also c/o dehydration (states no food/drink since yesterday d/t issues with her dishwasher). Admitted with COPD exacerbation.  Spoke with pt at bedside. Pt reports that she is eating well at home when she feels well. She reports being able to cook for herself but has been unable to keep fluids "in." She reports it feels like she "pees" them all out.  She also report drinking Ensure at home, not liking Boost products at all.  Per RD observation, pt ate 100% of breakfast this morning. Pt reports not eating too much of her dinner due to pain in her foot. She reports a good appetite.  She reports a 40-50 lb weight loss over the past 1-2 years. Weight records do not indicate this.  Pt's weight is up from previous weight at the end of November 2022. This is likely inaccurate given weight trend. Also, weight is exact in nature, therefore is likely stated.  RD to order measured weight to determine changes.  Pt also has not had a bowel movement in 8 days. RD to message  MD about bowel regimen.  Medications: reviewed; Synthroid, prednisone, LR @ 75 ml/hr, Xanax PRN (given twice today), oxycodone PO PRN (given twice today)  Labs: reviewed;    NUTRITION - FOCUSED PHYSICAL EXAM: Flowsheet Row Most Recent Value  Orbital Region Moderate depletion  Upper Arm Region Severe depletion  Thoracic and Lumbar Region Mild depletion  Buccal Region Severe depletion  Temple Region Moderate depletion  Clavicle Bone Region Severe depletion  Clavicle and Acromion Bone Region Severe depletion  Scapular Bone Region Unable to assess  Dorsal Hand Moderate depletion  Patellar Region Moderate depletion  Anterior Thigh Region Moderate depletion  Posterior Calf Region Moderate depletion  Edema (RD Assessment) None  Hair Reviewed  Eyes Reviewed  Mouth Reviewed  [Missing top row of teeth]  Skin Reviewed  Nails Reviewed   Diet Order:   Diet Order             Diet regular Room service appropriate? Yes; Fluid consistency: Thin  Diet effective now                  EDUCATION NEEDS:  Education needs have been addressed  Skin:  Skin Assessment: Reviewed RN Assessment  Last BM:  11/13/21  Height:  Ht Readings from Last 1 Encounters:  11/20/21 5\' 4"  (1.626 m)   Weight:  Wt Readings from Last 1 Encounters:  11/20/21 60.8 kg   BMI:  Body mass index is 23 kg/m.  Estimated Nutritional Needs:  Kcal:  1800-2000 Protein:  85-100 grams Fluid:  >1.8  L  Derrel Nip, RD, LDN (she/her/hers) Clinical Inpatient Dietitian RD Pager/After-Hours/Weekend Pager # in [place]

## 2021-11-22 ENCOUNTER — Inpatient Hospital Stay: Payer: Medicare HMO

## 2021-11-22 LAB — VITAMIN B12: Vitamin B-12: 267 pg/mL (ref 180–914)

## 2021-11-22 NOTE — Progress Notes (Signed)
PROGRESS NOTE    [name] [facility]  WUJ:[id number] DOB: 21-May-1946 DOA: 11/20/2021 PCP: Albina Billet, MD   Chief Complaint  Patient presents with   Respiratory Distress   Anxiety    Brief Narrative:   76 year old lady with prior h/o COPD, hypothyroidism Presented via EMS w/ SOB and anxiety (pt states out of inhalers and Xanax x 2 days), pt also c/o dehydration (states no food/drink since yesterday d/t issues with her dishwasher). She was admitted for copd exacerbation.   Assessment & Plan:   Principal Problem:   COPD exacerbation ([place]) Active Problems:   Hypothyroidism   Anxiety and depression   Long term prescription opiate use   Chronic pain - multiple sites arthritis   Benzodiazepine dependence, continuous (HCC)   Peripheral vascular disease (HCC)   Hyponatremia   Hypocalcemia   Hypoalbuminemia due to protein-calorie malnutrition (HCC)   Anemia   Protein-calorie malnutrition, severe   COPD with acute exacerbation (HCC)  Acute COPD exacerbation;  Currently on oral prednisone, plan for a quick taper, duo nebs,  TOC on board.    Benzo dependence:  Resume home xanax dose.    Anxiety and depression.  Restarted ho me meds.    Anemia:  Normocytic.    Hypothyroidism:  Resume synthroid.    Protein calorie malnutrition;  Dietary on board.    PVD:  S/p amputation of the right fourth toe about a month ago.  Podiatry consulted.  Pt reports persistent pain in the right foot at the site of sutures, will order MRI o the foot to evaluate for osteomyelitis.     DVT prophylaxis: (Lovenox/) Code Status: (Full/ code) Family Communication: None at bedside.  Disposition:   Status is: Observation  The patient will require care spanning > 2 midnights and should be moved to inpatient because: sob.       Consultants:  None.   Procedures: none.   Antimicrobials: none.    Subjective: Breathing has improved, no cough,  Sever pain in the right foot on  palpation.   Objective: Vitals:   11/22/21 0503 11/22/21 0757 11/22/21 1220 11/22/21 1651  BP: (!) 145/81 137/71 99/67 119/75  Pulse: 91 90 99 83  Resp: 20 18 16 16   Temp: (!) 97.5 F (36.4 C) 97.8 F (36.6 C) 98.4 F (36.9 C) 98.1 F (36.7 C)  TempSrc: Oral Oral Oral Oral  SpO2: 90% 91% 90% 91%  Weight:      Height:        Intake/Output Summary (Last 24 hours) at 11/22/2021 1729 Last data filed at 11/22/2021 1300 Gross per 24 hour  Intake 240 ml  Output 3350 ml  Net -3110 ml    Filed Weights   11/20/21 0926 11/22/21 0500  Weight: 60.8 kg 57.5 kg    Examination:  General exam: alert and comfortable.  Respiratory system: air entry fair, no wheezing heard.  Cardiovascular system: S1 & S2 , RRR, no JVD, no pedal edema.  Gastrointestinal system: Abdomen is soft, NT ND BS+ Central nervous system: Alert and oriented, non foca.  Extremities: right foot bandaged.  Skin: right 2,3,4 th toe amputation.  Psychiatry:  Mood is appropriate.     Data Reviewed: I have personally reviewed following labs and imaging studies  CBC: Recent Labs  Lab 11/20/21 0931 11/21/21 0325  WBC 8.7 11.3*  NEUTROABS 6.4  --   HGB 10.5* 9.5*  HCT 33.5* 30.3*  MCV 87.9 87.6  PLT 369 331     Basic  Metabolic Panel: Recent Labs  Lab 11/20/21 0931 11/21/21 0325  NA 133* 138  K 4.4 3.6  CL 101 107  CO2 21* 27  GLUCOSE 84 89  BUN 11 14  CREATININE 0.57 0.54  CALCIUM 8.6* 8.3*     GFR: Estimated Creatinine Clearance: 53.3 mL/min (by C-G formula based on SCr of 0.54 mg/dL).  Liver Function Tests: Recent Labs  Lab 11/20/21 0931  AST 37  ALT 13  ALKPHOS 66  BILITOT 1.2  PROT 7.3  ALBUMIN 3.4*     CBG: No results for input(s): GLUCAP in the last 168 hours.   Recent Results (from the past 240 hour(s))  Resp Panel by RT-PCR (Flu A&B, Covid) Nasopharyngeal Swab     Status: None   Collection Time: 11/20/21  9:31 AM   Specimen: Nasopharyngeal Swab; Nasopharyngeal(NP) swabs  in vial transport medium  Result Value Ref Range Status   SARS Coronavirus 2 by RT PCR NEGATIVE NEGATIVE Final    Comment: (NOTE) SARS-CoV-2 target nucleic acids are NOT DETECTED.  The SARS-CoV-2 RNA is generally detectable in upper respiratory specimens during the acute phase of infection. The lowest concentration of SARS-CoV-2 viral copies this assay can detect is 138 copies/mL. A negative result does not preclude SARS-Cov-2 infection and should not be used as the sole basis for treatment or other patient management decisions. A negative result may occur with  improper specimen collection/handling, submission of specimen other than nasopharyngeal swab, presence of viral mutation(s) within the areas targeted by this assay, and inadequate number of viral copies(<138 copies/mL). A negative result must be combined with clinical observations, patient history, and epidemiological information. The expected result is Negative.  Fact Sheet for Patients:  EntrepreneurPulse.com.au  Fact Sheet for Healthcare Providers:  IncredibleEmployment.be  This test is no t yet approved or cleared by the [place] FDA and  has been authorized for detection and/or diagnosis of SARS-CoV-2 by FDA under an Emergency Use Authorization (EUA). This EUA will remain  in effect (meaning this test can be used) for the duration of the COVID-19 declaration under Section 564(b)(1) of the Act, 21 U.S.C.section 360bbb-3(b)(1), unless the authorization is terminated  or revoked sooner.       Influenza A by PCR NEGATIVE NEGATIVE Final   Influenza B by PCR NEGATIVE NEGATIVE Final    Comment: (NOTE) The Xpert Xpress SARS-CoV-2/FLU/RSV plus assay is intended as an aid in the diagnosis of influenza from Nasopharyngeal swab specimens and should not be used as a sole basis for treatment. Nasal washings and aspirates are unacceptable for Xpert Xpress  SARS-CoV-2/FLU/RSV testing.  Fact Sheet for Patients: EntrepreneurPulse.com.au  Fact Sheet for Healthcare Providers: IncredibleEmployment.be  This test is not yet approved or cleared by the [place] FDA and has been authorized for detection and/or diagnosis of SARS-CoV-2 by FDA under an Emergency Use Authorization (EUA). This EUA will remain in effect (meaning this test can be used) for the duration of the COVID-19 declaration under Section 564(b)(1) of the Act, 21 U.S.C. section 360bbb-3(b)(1), unless the authorization is terminated or revoked.  Performed at [facility], [address] [place], [place] [address]   Blood culture (routine x 2)     Status: None (Preliminary result)   Collection Time: 11/20/21  9:31 AM   Specimen: BLOOD  Result Value Ref Range Status   Specimen Description BLOOD LEFT ANTECUBITAL  Final   Special Requests   Final    BOTTLES DRAWN AEROBIC AND ANAEROBIC Blood Culture adequate volume  Culture   Final    NO GROWTH 2 DAYS Performed at [facility], [address] [place], [place] [address]    Report Status PENDING  Incomplete  Blood culture (routine x 2)     Status: None (Preliminary result)   Collection Time: 11/20/21  9:31 AM   Specimen: BLOOD  Result Value Ref Range Status   Specimen Description BLOOD BLOOD RIGHT WRIST  Final   Special Requests   Final    BOTTLES DRAWN AEROBIC AND ANAEROBIC Blood Culture adequate volume   Culture   Final    NO GROWTH 2 DAYS Performed at [facility], [address] [place], [place] [address]    Report Status PENDING  Incomplete          Radiology Studies: No results found.      Scheduled Meds:  amLODipine  5 mg Oral Daily   atorvastatin  10 mg Oral QPM   buPROPion  150 mg Oral Daily   calcium carbonate  1 tablet Oral TID WC   clopidogrel  75 mg Oral Daily   enoxaparin (LOVENOX) injection  40 mg Subcutaneous Q24H    escitalopram  10 mg Oral Q1500   feeding supplement  237 mL Oral TID BM   levothyroxine  88 mcg Oral QAC breakfast   montelukast  10 mg Oral Daily   multivitamin with minerals  1 tablet Oral Daily   pantoprazole  40 mg Oral Q0600   polyethylene glycol  17 g Oral BID   predniSONE  40 mg Oral Q breakfast   senna-docusate  2 tablet Oral BID   umeclidinium-vilanterol  1 puff Inhalation Daily   Continuous Infusions:     LOS: 1 day        Hosie Poisson, MD [facility]   To contact the attending provider between 7A-7P or the covering provider during after hours 7P-7A, please log into the web site [email or web address] and access using universal  password for that web site. If you do not have the password, please call the hospital operator.  11/22/2021, 5:29 PM

## 2021-11-22 NOTE — Consult Note (Signed)
Reason for Consult: Postoperative amputation right fourth toe. Referring Physician: Sayward Horvath is an 76 y.o. female.  HPI: This is a 76 year old female with history of peripheral vascular disease and amputation of the right fourth toe about a month ago.  Relates continued pain with her right fourth toe.  Recently hospitalized and consultation obtained for reevaluation of the amputation site.  Past Medical History:  Diagnosis Date   Anxiety    CAD (coronary artery disease)    Carpal tunnel syndrome    Cataract    Complication of anesthesia    has woken up at times   COPD (chronic obstructive pulmonary disease) (HCC)    CRP elevated 09/14/2015   Depression    Difficult intubation    Dyslipidemia    Dyspnea    DOE   Dysrhythmia    hx palpatations   Elevated sedimentation rate 09/14/2015   Esophageal spasm    Gastrointestinal parasites    GERD (gastroesophageal reflux disease)    HCAP (healthcare-associated pneumonia) 10/22/2018   Hiatal hernia    History of peptic ulcer disease    Hypertension    Hyperthyroidism    Hypothyroidism    Low magnesium levels 09/14/2015   Pelvic fracture ([place]) 2008   fall from riding a horse   Peripheral vascular disease ([place])    Reflux    Rotator cuff injury    Sepsis ([place]) 10/22/2018   Stenosis, spinal, lumbar     Past Surgical History:  Procedure Laterality Date   AMPUTATION TOE Right 12/26/2020   Procedure: AMPUTATION TOE-3rd Toes;  Surgeon: Samara Deist, DPM;  Location: [facility];  Service: Podiatry;  Laterality: Right;   AMPUTATION TOE Right 04/22/2021   Procedure: AMPUTATION TOE- RIGHT 2ND;  Surgeon: Samara Deist, DPM;  Location: [facility];  Service: Podiatry;  Laterality: Right;   AMPUTATION TOE Right 10/21/2021   Procedure: AMPUTATION TOE;  Surgeon: Caroline More, DPM;  Location: [facility];  Service: Podiatry;  Laterality: Right;   APPENDECTOMY     BACK SURGERY     CERVICAL FUSION   CARPAL TUNNEL RELEASE     CATARACT  EXTRACTION W/PHACO Right 08/07/2017   Procedure: CATARACT EXTRACTION PHACO AND INTRAOCULAR LENS PLACEMENT ([place]);  Surgeon: Birder Robson, MD;  Location: [facility];  Service: Ophthalmology;  Laterality: Right;  [place] 00:52.0 AP% 16.8 CDE 8.74 Fluid Pack Lot # [id number]   CATARACT EXTRACTION W/PHACO Left 09/04/2017   Procedure: CATARACT EXTRACTION PHACO AND INTRAOCULAR LENS PLACEMENT ([place]);  Surgeon: Birder Robson, MD;  Location: [facility];  Service: Ophthalmology;  Laterality: Left;  [place] 00:34 AP% 17.0 CDE 5.80 Fluid pack lot # [id number]   CESAREAN SECTION     CHOLECYSTECTOMY     FRACTURE SURGERY     HIP SURGERY     INTRAMEDULLARY (IM) NAIL INTERTROCHANTERIC Right 10/01/2016   Procedure: INTRAMEDULLARY (IM) NAIL INTERTROCHANTRIC;  Surgeon: Corky Mull, MD;  Location: [facility];  Service: Orthopedics;  Laterality: Right;   INTRAMEDULLARY (IM) NAIL INTERTROCHANTERIC Left 01/14/2021   Procedure: INTRAMEDULLARY (IM) NAIL INTERTROCHANTRIC;  Surgeon: Lovell Sheehan, MD;  Location: [facility];  Service: Orthopedics;  Laterality: Left;   KYPHOPLASTY N/A 08/20/2018   Procedure: [id number];  Surgeon: Hessie Knows, MD;  Location: [facility];  Service: Orthopedics;  Laterality: N/A;   LOWER EXTREMITY ANGIOGRAPHY Right 12/18/2019   Procedure: LOWER EXTREMITY ANGIOGRAPHY;  Surgeon: Algernon Huxley, MD;  Location: [place] [facility];  Service: Cardiovascular;  Laterality: Right;   LOWER EXTREMITY ANGIOGRAPHY Right 12/02/2020  Procedure: LOWER EXTREMITY ANGIOGRAPHY;  Surgeon: Algernon Huxley, MD;  Location: [place] [facility];  Service: Cardiovascular;  Laterality: Right;   LOWER EXTREMITY ANGIOGRAPHY Right 12/23/2020   Procedure: Lower Extremity Angiography;  Surgeon: Algernon Huxley, MD;  Location: [place] [facility];  Service: Cardiovascular;  Laterality: Right;   LOWER EXTREMITY ANGIOGRAPHY Right 12/24/2020   Procedure: Lower Extremity Angiography;  Surgeon: Algernon Huxley, MD;  Location: [place] [facility];   Service: Cardiovascular;  Laterality: Right;   LOWER EXTREMITY ANGIOGRAPHY Right 10/20/2021   Procedure: Lower Extremity Angiography;  Surgeon: Algernon Huxley, MD;  Location: [place] [facility];  Service: Cardiovascular;  Laterality: Right;   NECK SURGERY     NOSE SURGERY     ROTATOR CUFF REPAIR     x2   SHOULDER ARTHROSCOPY  12/07/2011   Procedure: ARTHROSCOPY SHOULDER;  Surgeon: Ninetta Lights, MD;  Location: [place];  Service: Orthopedics;  Laterality: Right;  Debridement Partial Cuff Tear, Release Coracoacromial Ligament   SHOULDER SURGERY  12/07/2011   right    Family History  Problem Relation Age of Onset   Aneurysm Other     Social History:  reports that she has been smoking cigarettes. She has never used smokeless tobacco. She reports that she does not drink alcohol and does not use drugs.  Allergies: No Known Allergies  Medications: Scheduled:  amLODipine  5 mg Oral Daily   atorvastatin  10 mg Oral QPM   buPROPion  150 mg Oral Daily   calcium carbonate  1 tablet Oral TID WC   clopidogrel  75 mg Oral Daily   enoxaparin (LOVENOX) injection  40 mg Subcutaneous Q24H   escitalopram  10 mg Oral Q1500   feeding supplement  237 mL Oral TID BM   levothyroxine  88 mcg Oral QAC breakfast   montelukast  10 mg Oral Daily   multivitamin with minerals  1 tablet Oral Daily   pantoprazole  40 mg Oral Q0600   polyethylene glycol  17 g Oral BID   predniSONE  40 mg Oral Q breakfast   senna-docusate  2 tablet Oral BID   umeclidinium-vilanterol  1 puff Inhalation Daily    Results for orders placed or performed during the hospital encounter of 11/20/21 (from the past 48 hour(s))  Brain natriuretic peptide     Status: None   Collection Time: 11/20/21  7:11 PM  Result Value Ref Range   B Natriuretic Peptide 49.9 0.0 - 100.0 pg/mL    Comment: Performed at [facility], [address] [place], [place] [address]  HIV Antibody (routine testing w rflx)     Status:  None   Collection Time: 11/20/21  7:11 PM  Result Value Ref Range   HIV Screen 4th Generation wRfx Non Reactive Non Reactive    Comment: Performed at [facility], [place] [address] [place], [place] [address]  Basic metabolic panel     Status: Abnormal   Collection Time: 11/21/21  3:25 AM  Result Value Ref Range   Sodium 138 135 - 145 mmol/L   Potassium 3.6 3.5 - 5.1 mmol/L   Chloride 107 98 - 111 mmol/L   CO2 27 22 - 32 mmol/L   Glucose, Bld 89 70 - 99 mg/dL    Comment: Glucose reference range applies only to samples taken after fasting for at least 8 hours.   BUN 14 8 - 23 mg/dL   Creatinine, Ser 0.54 0.44 -  1.00 mg/dL   Calcium 8.3 (L) 8.9 - 10.3 mg/dL   GFR, Estimated >60 >60 mL/min    Comment: (NOTE) Calculated using the CKD-EPI Creatinine Equation (2021)    Anion gap 4 (L) 5 - 15    Comment: Performed at [facility], [address] [place], [place] [address]  CBC     Status: Abnormal   Collection Time: 11/21/21  3:25 AM  Result Value Ref Range   WBC 11.3 (H) 4.0 - 10.5 K/uL   RBC 3.46 (L) 3.87 - 5.11 MIL/uL   Hemoglobin 9.5 (L) 12.0 - 15.0 g/dL   HCT 30.3 (L) 36.0 - 46.0 %   MCV 87.6 80.0 - 100.0 fL   MCH 27.5 26.0 - 34.0 pg   MCHC 31.4 30.0 - 36.0 g/dL   RDW 14.6 11.5 - 15.5 %   Platelets 331 150 - 400 K/uL   nRBC 0.0 0.0 - 0.2 %    Comment: Performed at [facility], [address] [place], [place] [address]  Vitamin B12     Status: None   Collection Time: 11/21/21 11:35 AM  Result Value Ref Range   Vitamin B-12 267 180 - 914 pg/mL    Comment: (NOTE) This assay is not validated for testing neonatal or myeloproliferative syndrome specimens for Vitamin B12 levels. Performed at [facility], [place] [address], [place], [place] [address]   Folate     Status: None   Collection Time: 11/21/21 11:35 AM  Result Value Ref Range   Folate 13.3 >5.9 ng/mL    Comment: Performed at [facility], [address] [place], [place]  [address]  Iron and TIBC     Status: Abnormal   Collection Time: 11/21/21 11:35 AM  Result Value Ref Range   Iron 23 (L) 28 - 170 ug/dL   TIBC 375 250 - 450 ug/dL   Saturation Ratios 6 (L) 10.4 - 31.8 %   UIBC 352 ug/dL    Comment: Performed at [facility], [address] [place], [place] [address]  Ferritin     Status: Abnormal   Collection Time: 11/21/21 11:35 AM  Result Value Ref Range   Ferritin 9 (L) 11 - 307 ng/mL    Comment: Performed at [facility], [address] [place], [place] [address]  Reticulocytes     Status: Abnormal   Collection Time: 11/21/21 11:35 AM  Result Value Ref Range   Retic Ct Pct 3.3 (H) 0.4 - 3.1 %    Comment: REPEATED TO VERIFY   RBC. 3.77 (L) 3.87 - 5.11 MIL/uL    Comment: RESULTS CONFIRMED BY MANUAL DILUTION   Retic Count, Absolute 121.0 19.0 - 186.0 K/uL   Immature Retic Fract 17.2 (H) 2.3 - 15.9 %    Comment: Performed at [facility], [address] [place], [place] [address]    No results found.  Review of Systems  Constitutional:  Negative for chills and fever.  HENT:  Negative for sinus pain and sore throat.   Respiratory:  Negative for cough and shortness of breath.   Cardiovascular:  Negative for chest pain and palpitations.  Gastrointestinal:  Negative for nausea and vomiting.  Genitourinary:  Negative for frequency and urgency.  Musculoskeletal:        Patient relates continued severe pain in her right foot at the amputation site.  Skin:        Patient denies any significant swelling or redness.  Recent amputation of  the right fourth toe sutures still intact.  Neurological:  Negative for numbness.  Psychiatric/Behavioral:  Negative for confusion. The patient is not nervous/anxious.   Blood pressure 99/67, pulse 99, temperature 98.4 F (36.9 C), temperature source Oral, resp. rate 16, height 5\' 4"  (1.626 m), weight 57.5 kg, SpO2 90 %. Physical Exam Cardiovascular:     Comments: DP and PT pulses  difficult to palpate. Musculoskeletal:     Comments: Previous amputation of toes 2, 3, and 4 right foot.  Exquisite pain with any attempted palpation.  Skin:    Comments: The skin is warm dry and somewhat atrophic.  Sutures intact at the right fourth toe amputation site.  Upon removal there is some mild drainage and dehiscence of the wound with exposed bone through the lateral aspect of the amputation site.  No significant cellulitis proximal to the distal amputation site.  Neurological:     Comments: Sensation appears intact.     Assessment/Plan: Assessment: 1.  Dehisced amputation site right fourth toe with exposed bone. 2.  Osteomyelitis. 3.  Peripheral vascular disease.  Plan: Sutures removed today.  Betadine and a sterile bandage reapplied.  Discussed with the patient that there does appear to be exposed bone through the amputation site.  Discussed that she will most likely need revision amputation of the rest of the toe.  Waiting on MRI which he says is being performed today.  I will discuss with Dr. Luana Shu.  Plan for revision amputation during this hospital stay.  Durward Fortes 11/22/2021, 2:00 PM

## 2021-11-22 NOTE — TOC Progression Note (Signed)
Transition of Care [facility]) - Progression Note    Patient Details  Name: [name] MRN: [id number] Date of Birth: 04/04/1946  Transition of Care [facility]) CM/SW [place], RN Phone Number: 11/22/2021, 12:17 PM  Clinical Narrative:   Patient getting MRI for cont medical workup today.  Active with [facility] Pharmacy aware of med challenges.  Toc to follow         Expected Discharge Plan and Services                                                 Social Determinants of Health (SDOH) Interventions    Readmission Risk Interventions Readmission Risk Prevention Plan 01/14/2021  PCP or Specialist Appt within 3-5 Days Complete  HRI or [place] Complete  Social Work Consult for [place] Planning/Counseling Complete  Palliative Care Screening Not Applicable  Medication Review Press photographer) Complete  Some recent data might be hidden

## 2021-11-22 NOTE — Consult Note (Signed)
[facility] Nurse Consult Note: Patient receiving care in [facility]. Reason for Consult: right toe surgical site Wound type: partial amputation of right 4th toe by Podiatrist Caroline More on 10/21/21 Pressure Injury POA: Yes/No/NA Measurement: sutures remain intact Wound bed: very small amount of tan drainage, some dried blood to incision site, no overt erythema Drainage (amount, consistency, odor)  Periwound: intact Dressing procedure/placement/frequency: MOISTEN old dressing with saline to remove without causing pain/trauma to right foot. Then place a small piece of Xeroform gauze over the toe amputation site, then dry gauze, tape in place.  [place] nurse will not follow at this time.  Please re-consult the [facility] team if needed.  Val Riles, RN, MSN, CWOCN, CNS-BC, pager [phone]

## 2021-11-23 ENCOUNTER — Inpatient Hospital Stay: Payer: Medicare HMO | Admitting: Anesthesiology

## 2021-11-23 ENCOUNTER — Encounter
Admission: EM | Disposition: A | Discharge status: Home Health | Payer: Self-pay | Source: Home / Self Care | Attending: Internal Medicine

## 2021-11-23 ENCOUNTER — Encounter: Payer: Self-pay | Admitting: Internal Medicine

## 2021-11-23 DIAGNOSIS — M86271 Subacute osteomyelitis, right ankle and foot: Secondary | ICD-10-CM

## 2021-11-23 DIAGNOSIS — E559 Vitamin D deficiency, unspecified: Secondary | ICD-10-CM

## 2021-11-23 DIAGNOSIS — I10 Essential (primary) hypertension: Secondary | ICD-10-CM

## 2021-11-23 DIAGNOSIS — E785 Hyperlipidemia, unspecified: Secondary | ICD-10-CM

## 2021-11-23 HISTORY — PX: AMPUTATION TOE: SHX6595

## 2021-11-23 LAB — MRSA NEXT GEN BY PCR, NASAL: MRSA by PCR Next Gen: NOT DETECTED

## 2021-11-23 SURGERY — AMPUTATION, TOE
Anesthesia: Monitor Anesthesia Care | Site: Toe | Laterality: Right

## 2021-11-23 MED ORDER — VANCOMYCIN HCL 1500 MG/300ML IV SOLN
1500.0000 mg | Freq: Once | INTRAVENOUS | Status: AC
  Administered 2021-11-23: 13:00:00 1500 mg via INTRAVENOUS
  Filled 2021-11-23: qty 300

## 2021-11-23 MED ORDER — FENTANYL CITRATE PF 50 MCG/ML IJ SOSY: 25.0000 ug | PREFILLED_SYRINGE | INTRAMUSCULAR | Status: DC | PRN

## 2021-11-23 MED ORDER — SODIUM CHLORIDE 0.9 % IR SOLN
Status: DC | PRN
  Administered 2021-11-23: 100 mL

## 2021-11-23 MED ORDER — VITAMIN D (ERGOCALCIFEROL) 1.25 MG (50000 UNIT) PO CAPS
50000.0000 [IU] | ORAL_CAPSULE | ORAL | Status: DC
  Administered 2021-11-24: 08:00:00 50000 [IU] via ORAL
  Filled 2021-11-23: qty 1

## 2021-11-23 MED ORDER — SODIUM CHLORIDE 0.9 % IV SOLN
2.0000 g | INTRAVENOUS | Status: DC
  Administered 2021-11-23 – 2021-11-24 (×2): 2 g via INTRAVENOUS
  Filled 2021-11-23: qty 20
  Filled 2021-11-23 (×2): qty 2

## 2021-11-23 MED ORDER — PROPOFOL 500 MG/50ML IV EMUL
INTRAVENOUS | Status: DC | PRN
  Administered 2021-11-23: 200 ug/kg/min via INTRAVENOUS

## 2021-11-23 MED ORDER — MORPHINE SULFATE (PF) 2 MG/ML IV SOLN
1.0000 mg | INTRAVENOUS | Status: DC | PRN
  Administered 2021-11-23 – 2021-11-24 (×2): 1 mg via INTRAVENOUS
  Filled 2021-11-23 (×2): qty 1

## 2021-11-23 MED ORDER — BUPIVACAINE HCL 0.5 % IJ SOLN
INTRAMUSCULAR | Status: DC | PRN
  Administered 2021-11-23: 10 mL

## 2021-11-23 MED ORDER — VANCOMYCIN HCL 750 MG/150ML IV SOLN
750.0000 mg | Freq: Two times a day (BID) | INTRAVENOUS | Status: DC
  Administered 2021-11-24: 06:00:00 750 mg via INTRAVENOUS
  Filled 2021-11-23 (×2): qty 150

## 2021-11-23 MED ORDER — SODIUM CHLORIDE 0.9 % IV SOLN: INTRAVENOUS | Status: DC

## 2021-11-23 MED ORDER — NEOMYCIN-POLYMYXIN B GU 40-200000 IR SOLN
Status: AC
  Filled 2021-11-23: qty 2

## 2021-11-23 MED ORDER — BUPIVACAINE HCL (PF) 0.5 % IJ SOLN
INTRAMUSCULAR | Status: AC
  Filled 2021-11-23: qty 30

## 2021-11-23 SURGICAL SUPPLY — 34 items
BLADE SURG 15 STRL LF DISP TIS (BLADE) ×2 IMPLANT
BLADE SURG 15 STRL SS (BLADE) ×4
BLADE SURG MINI STRL (BLADE) ×2 IMPLANT
BNDG CONFORM 2 STRL LF (GAUZE/BANDAGES/DRESSINGS) ×2 IMPLANT
BNDG CONFORM 3 STRL LF (GAUZE/BANDAGES/DRESSINGS) ×2 IMPLANT
BNDG ELASTIC 4X5.8 VLCR STR LF (GAUZE/BANDAGES/DRESSINGS) ×2 IMPLANT
BNDG GAUZE ELAST 4 BULKY (GAUZE/BANDAGES/DRESSINGS) ×2 IMPLANT
ELECT REM PT RETURN 9FT ADLT (ELECTROSURGICAL) ×2
ELECTRODE REM PT RTRN 9FT ADLT (ELECTROSURGICAL) ×1 IMPLANT
GAUZE 4X4 16PLY ~~LOC~~+RFID DBL (SPONGE) ×2 IMPLANT
GAUZE SPONGE 4X4 12PLY STRL (GAUZE/BANDAGES/DRESSINGS) ×3 IMPLANT
GAUZE XEROFORM 1X8 LF (GAUZE/BANDAGES/DRESSINGS) ×2 IMPLANT
GLOVE SURG ENC MOIS LTX SZ7.5 (GLOVE) ×2 IMPLANT
GLOVE SURG UNDER LTX SZ8 (GLOVE) ×2 IMPLANT
GOWN STRL REUS W/ TWL LRG LVL3 (GOWN DISPOSABLE) ×2 IMPLANT
GOWN STRL REUS W/TWL LRG LVL3 (GOWN DISPOSABLE) ×4
KIT TURNOVER KIT A (KITS) ×2 IMPLANT
LABEL OR SOLS (LABEL) ×2 IMPLANT
MANIFOLD NEPTUNE II (INSTRUMENTS) ×2 IMPLANT
NDL FILTER BLUNT 18X1 1/2 (NEEDLE) ×1 IMPLANT
NDL HYPO 25X1 1.5 SAFETY (NEEDLE) ×2 IMPLANT
NEEDLE FILTER BLUNT 18X 1/2SAF (NEEDLE) ×1
NEEDLE FILTER BLUNT 18X1 1/2 (NEEDLE) ×1 IMPLANT
NEEDLE HYPO 25X1 1.5 SAFETY (NEEDLE) ×4 IMPLANT
NS IRRIG 500ML POUR BTL (IV SOLUTION) ×2 IMPLANT
PACK EXTREMITY ARMC (MISCELLANEOUS) ×2 IMPLANT
PAD ABD DERMACEA PRESS 5X9 (GAUZE/BANDAGES/DRESSINGS) ×3 IMPLANT
SOL PREP PVP 2OZ (MISCELLANEOUS) ×6
SOLUTION PREP PVP 2OZ (MISCELLANEOUS) ×1 IMPLANT
STOCKINETTE STRL 6IN 960660 (GAUZE/BANDAGES/DRESSINGS) ×2 IMPLANT
STRIP CLOSURE SKIN 1/4X4 (GAUZE/BANDAGES/DRESSINGS) ×2 IMPLANT
SUT ETHILON 3-0 FS-10 30 BLK (SUTURE) ×2
SUTURE EHLN 3-0 FS-10 30 BLK (SUTURE) ×1 IMPLANT
SYR 10ML LL (SYRINGE) ×2 IMPLANT

## 2021-11-23 NOTE — Anesthesia Procedure Notes (Signed)
Date/Time: 11/23/2021 5:48 PM Performed by: Nelda Marseille, CRNA Pre-anesthesia Checklist: Patient identified, Emergency Drugs available, Suction available, Patient being monitored and Timeout performed Oxygen Delivery Method: Simple face mask

## 2021-11-23 NOTE — Progress Notes (Signed)
PT Cancellation Note  Patient Details Name: [name] MRN: [id number] DOB: 1946-04-12   Cancelled Treatment:    Reason Eval/Treat Not Completed: Other (comment). Per chart review, pt scheduled for surgery later today. PT to complete current orders, please re-consult when patient is medically appropriate and precautions are established.   Lieutenant Diego PT, DPT 12:19 PM,11/23/21

## 2021-11-23 NOTE — TOC Progression Note (Signed)
Transition of Care [facility]) - Progression Note    Patient Details  Name: [name] MRN: [id number] Date of Birth: May 16, 1946  Transition of Care [facility]) CM/SW [place], RN Phone Number: 11/23/2021, 12:48 PM  Clinical Narrative:   Surgical candidate.  Continued workup TOC to follow         Expected Discharge Plan and Services                                                 Social Determinants of Health (SDOH) Interventions    Readmission Risk Interventions Readmission Risk Prevention Plan 01/14/2021  PCP or Specialist Appt within 3-5 Days Complete  HRI or [place] Complete  Social Work Consult for [place] Planning/Counseling Complete  Palliative Care Screening Not Applicable  Medication Review Press photographer) Complete  Some recent data might be hidden

## 2021-11-23 NOTE — Op Note (Signed)
Date of operation: 11/23/2021.  Surgeon: Durward Fortes D.P.M.  Preoperative diagnosis: Osteomyelitis right fourth toe.  Postoperative diagnosis: Same.  Procedure: Amputation right fourth toe.  Anesthesia: Local MAC.  Hemostasis: None.  Estimated blood loss: Less than 5 cc.  Pathology: Right fourth toe.  Cultures: Bone cultures right fourth toe proximal phalanx.  Complications: None other than minimal bleeding.  Operative indications: This is a 76 year old female with significant history of peripheral vascular disease who underwent partial amputation of the right fourth toe a month ago.  Nonhealing wound with bone protruding through the incision site and decision was made for completion amputation of the right fourth toe.  Operative procedure: Patient was taken to the operating room and placed on the table in the supine position.  Following satisfactory sedation the right forefoot was anesthetized with 10 cc of 0.5% Marcaine plain.  The foot was prepped and draped in the usual sterile fashion.  Attention directed to the distal aspect of the right foot where an elliptical incision was made coursing dorsal to plantar around the medial and lateral base of the remainder of the fourth toe.  Incision carried sharply down to the level of the bone and dissection carried back proximally to the joint where the toe was disarticulated and removed in toto.  Sample of the bone was taken for culture and the toe passed off for pathology.  The wound was then flushed with copious amounts of sterile saline and closed using 3-0 nylon vertical mattress and simple interrupted sutures.  Xeroform 4 x 4's conformer applied to the right foot followed by ABD Kerlix and Ace.  Patient was awakened and transported to the PACU with vital signs stable and in good condition.

## 2021-11-23 NOTE — Transfer of Care (Signed)
Immediate Anesthesia Transfer of Care Note  Patient: [place]  Procedure(s) Performed: AMPUTATION TOE (Right: Toe)  Patient Location: PACU  Anesthesia Type:General  Level of Consciousness: awake, alert  and oriented  Airway & Oxygen Therapy: Patient Spontanous Breathing and Patient connected to face mask oxygen  Post-op Assessment: Report given to RN and Post -op Vital signs reviewed and stable  Post vital signs: Reviewed and stable  Last Vitals:  Vitals Value Taken Time  BP 129/105 11/23/21 1815  Temp    Pulse 80 11/23/21 1815  Resp 26 11/23/21 1815  SpO2 100 % 11/23/21 1815  Vitals shown include unvalidated device data.  Last Pain:  Vitals:   11/23/21 1134  TempSrc:   PainSc: 8          Complications: No notable events documented.

## 2021-11-23 NOTE — Interval H&P Note (Signed)
History and Physical Interval Note:  11/23/2021 4:54 PM  [place]  has presented today for surgery, with the diagnosis of Osteomyelitis.  The various methods of treatment have been discussed with the patient and family. After consideration of risks, benefits and other options for treatment, the patient has consented to  Procedure(s): AMPUTATION TOE (Right) as a surgical intervention.  The patient's history has been reviewed, patient examined, no change in status, stable for surgery.  I have reviewed the patient's chart and labs.  Questions were answered to the patient's satisfaction.     Durward Fortes

## 2021-11-23 NOTE — Progress Notes (Signed)
Patient ID: [name], female   DOB: [birth year], 76 y.o.   MRN: [id number] [facility] PROGRESS NOTE  Richelle Glick [facility] XNA:[id number] DOB: 10/04/1946 DOA: 11/20/2021 PCP: Albina Billet, MD  HPI/Subjective: Patient states her breathing could be better.  Still having some wheezing and some cough.  Admitted with COPD exacerbation.  Found to have osteomyelitis on MRI of the foot.  Podiatry taken to the operating room today.  Objective: Vitals:   11/23/21 0727 11/23/21 1133  BP: 136/73 109/67  Pulse: 75 92  Resp: 15 19  Temp: 97.7 F (36.5 C) 98.3 F (36.8 C)  SpO2: 91% 91%    Intake/Output Summary (Last 24 hours) at 11/23/2021 1503 Last data filed at 11/23/2021 1133 Gross per 24 hour  Intake --  Output 2475 ml  Net -2475 ml   Filed Weights   11/20/21 0926 11/22/21 0500  Weight: 60.8 kg 57.5 kg    ROS: Review of Systems  Respiratory:  Positive for cough and shortness of breath.   Cardiovascular:  Negative for chest pain.  Gastrointestinal:  Negative for abdominal pain, nausea and vomiting.  Exam: Physical Exam HENT:     Head: Normocephalic.     Mouth/Throat:     Pharynx: No oropharyngeal exudate.  Eyes:     General: Lids are normal.     Conjunctiva/sclera: Conjunctivae normal.  Cardiovascular:     Rate and Rhythm: Normal rate and regular rhythm.     Heart sounds: Normal heart sounds, S1 normal and S2 normal.  Pulmonary:     Breath sounds: Examination of the right-middle field reveals wheezing. Examination of the left-middle field reveals wheezing. Examination of the right-lower field reveals decreased breath sounds and wheezing. Examination of the left-lower field reveals decreased breath sounds and wheezing. Decreased breath sounds and wheezing present. No rhonchi or rales.  Abdominal:     Palpations: Abdomen is soft.     Tenderness: There is no abdominal tenderness.  Musculoskeletal:     Right lower leg: No swelling.     Left lower leg: No swelling.  Skin:     General: Skin is warm.     Findings: No rash.     Comments: Foot covered with bandage.  Neurological:     Mental Status: She is alert and oriented to person, place, and time.     Comments: Hearing impaired      Scheduled Meds:  amLODipine  5 mg Oral Daily   atorvastatin  10 mg Oral QPM   buPROPion  150 mg Oral Daily   calcium carbonate  1 tablet Oral TID WC   clopidogrel  75 mg Oral Daily   enoxaparin (LOVENOX) injection  40 mg Subcutaneous Q24H   escitalopram  10 mg Oral Q1500   feeding supplement  237 mL Oral TID BM   levothyroxine  88 mcg Oral QAC breakfast   montelukast  10 mg Oral Daily   multivitamin with minerals  1 tablet Oral Daily   pantoprazole  40 mg Oral Q0600   polyethylene glycol  17 g Oral BID   predniSONE  40 mg Oral Q breakfast   senna-docusate  2 tablet Oral BID   umeclidinium-vilanterol  1 puff Inhalation Daily   Continuous Infusions:  sodium chloride 50 mL/hr at 11/23/21 1139   cefTRIAXone (ROCEPHIN)  IV 2 g (11/23/21 1252)    Assessment/Plan:  Right fifth toe osteomyelitis as per radiologist MRI report.  [facility] plans on doing a right fourth toe amputation.  Started  antibiotics vancomycin and Rocephin. COPD exacerbation.  Continue nebulizer treatments and prednisone.  May end up needing to go back on Solu-Medrol if wheezing does not improve. Benzodiazepine dependence on Xanax Essential hypertension on Norvasc Hyperlipidemia unspecified on atorvastatin Anxiety depression on Lexapro, Wellbutrin and Xanax Hypothyroidism on specified on levothyroxine Peripheral vascular disease on Plavix Vitamin D deficiency start oral vitamin D Severe malnutrition in context of chronic illness     Code Status:     Code Status Orders  (From admission, onward)           Start     Ordered   11/20/21 1245  Full code  Continuous        11/20/21 1245           Code Status History     Date Active Date Inactive Code Status Order ID Comments  User Context   10/19/2021 0039 10/23/2021 1929 Full Code [id number]  Para Skeans, MD ED   04/22/2021 1047 04/22/2021 1718 Full Code [id number]  Samara Deist, DPM Inpatient   01/11/2021 2147 01/20/2021 2152 Full Code [id number]  Lenore Cordia, MD ED   12/22/2020 1123 01/01/2021 2345 Full Code [id number]  Collier Bullock, MD ED   10/22/2018 0225 10/24/2018 1650 Full Code [id number]  Lance Coon, MD Inpatient   08/18/2018 0248 08/21/2018 2036 Full Code [id number]  Amelia Jo, MD Inpatient   10/01/2016 1138 10/04/2016 1744 Full Code [id number]  Fritzi Mandes, MD Inpatient      Disposition Plan: Status is: Inpatient  [place]  [facility]

## 2021-11-23 NOTE — Consult Note (Incomplete)
Pharmacy Antibiotic Note  [name] is a 76 y.o. female admitted on 11/20/2021 with {Indications:3041527}.  Pharmacy has been consulted for *** dosing.  Plan: {Assessment:21075}  Height: 5\' 4"  (162.6 cm) Weight: 57.5 kg (126 lb 12.2 oz) IBW/kg (Calculated) : 54.7  Temp (24hrs), Avg:98 F (36.7 C), Min:97.6 F (36.4 C), Max:98.4 F (36.9 C)  Recent Labs  Lab 11/20/21 0931 11/21/21 0325  WBC 8.7 11.3*  CREATININE 0.57 0.54    Estimated Creatinine Clearance: 53.3 mL/min (by C-G formula based on SCr of 0.54 mg/dL).    No Known Allergies  Antimicrobials this admission: *** *** >> *** *** *** >> ***  Dose adjustments this admission: ***  Microbiology results: *** BCx: *** *** UCx: ***  *** Sputum: ***  *** MRSA PCR: ***  Thank you for allowing pharmacy to be a part of this patients care.  Shanon Brow Tanaysha Alkins 11/23/2021 1:47 PM

## 2021-11-23 NOTE — Progress Notes (Signed)
Subjective/Chief Complaint: Patient seen.  Still pain in the right foot but currently doing a little better.   Objective: Vital signs in last 24 hours: Temp:  [97.6 F (36.4 C)-98.4 F (36.9 C)] 98.3 F (36.8 C) (01/04 1133) Pulse Rate:  [75-93] 92 (01/04 1133) Resp:  [15-19] 19 (01/04 1133) BP: (109-147)/(67-79) 109/67 (01/04 1133) SpO2:  [91 %-93 %] 91 % (01/04 1133) Last BM Date: 11/20/21  Intake/Output from previous day: 01/03 0701 - 01/04 0700 In: 240 [P.O.:240] Out: 2550 [Urine:2550] Intake/Output this shift: Total I/O In: -  Out: 775 [Urine:775]  Bandage on the right foot is dry and intact.  Upon removal minimal signs of any drainage.  No clear evidence of any open ulcerative area on the fifth toe.  Reviewed MRI images as well as radiologist read.  At this point think that the only clear evidence of infection is in the fourth toe.  Lab Results:  Recent Labs    11/21/21 0325  WBC 11.3*  HGB 9.5*  HCT 30.3*  PLT 331   BMET Recent Labs    11/21/21 0325  NA 138  K 3.6  CL 107  CO2 27  GLUCOSE 89  BUN 14  CREATININE 0.54  CALCIUM 8.3*   PT/INR No results for input(s): LABPROT, INR in the last 72 hours. ABG No results for input(s): PHART, HCO3 in the last 72 hours.  Invalid input(s): PCO2, PO2  Studies/Results: MR FOOT RIGHT WO CONTRAST  Result Date: 11/23/2021 CLINICAL DATA:  History of recent left fourth toe amputation with persistent foot pain. EXAM: MRI OF THE RIGHT FOREFOOT WITHOUT CONTRAST TECHNIQUE: Multiplanar, multisequence MR imaging of the right foot was performed. No intravenous contrast was administered. COMPARISON:  Radiographs 12/22/2020 FINDINGS: Remote complete second and third toe amputations. There is also partial amputation of the fourth toe. Part of the proximal phalanx is still present. There is abnormal T2 signal intensity in the remaining portion of the phalanx which could be related to recent surgery or osteomyelitis. There is  abnormal T1 and T2 signal intensity in the distal phalanx of the fifth toe highly suspicious for osteomyelitis. Signal changes in the fifth metacarpal head and neck appear to be due to a combination of bone infarct and healing fracture. 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 Healed or healing fracture or healing infarct involving the fourth metatarsal head. Abnormal T1 and T2 signal intensity in the distal tip of the distal phalanx of the great toe could be reactive or stress related change or focus of osteomyelitis. Mild subcutaneous soft tissue swelling/edema suggesting cellulitis. There is also diffuse myofasciitis without definite findings for pyomyositis. Small bone infarcts are noted in the talar dome and also in the talar head near its articulation with the navicular bone. There is also a small bone infarct noted in the cuboid and in the base of the fourth metatarsal. IMPRESSION: 1. Findings highly suspicious for osteomyelitis involving the distal phalanx of the fifth toe. 2. Signal changes in the fifth metacarpal head and neck appear to be due to a combination of bone infarct and healing fracture. 3. Abnormal T1 and T2 signal intensity in the distal tip of the distal phalanx of the great toe could be reactive or stress related change or focus of osteomyelitis. 4. Prior complete second and third toe amputations. 5. Evidence of multiple prior bone infarcts as above. 6. Mild cellulitis and diffuse myofasciitis without definite findings for pyomyositis. Electronically Signed  By: Marijo Sanes M.D.   On: 11/23/2021 09:17    Anti-infectives: Anti-infectives (From admission, onward)    Start     Dose/Rate Route Frequency Ordered Stop   11/23/21 1315  vancomycin (VANCOREADY) IVPB 1500 mg/300 mL        1,500 mg 150 mL/hr over 120 Minutes Intravenous  Once 11/23/21 1222     11/23/21 1300  cefTRIAXone (ROCEPHIN) 2 g in sodium chloride 0.9 %  100 mL IVPB        2 g 200 mL/hr over 30 Minutes Intravenous Every 24 hours 11/23/21 1147     11/21/21 1030  azithromycin (ZITHROMAX) tablet 500 mg        500 mg Oral Daily 11/21/21 0943 11/22/21 0920   11/20/21 0930  cefTRIAXone (ROCEPHIN) 2 g in sodium chloride 0.9 % 100 mL IVPB        2 g 200 mL/hr over 30 Minutes Intravenous  Once 11/20/21 0926 11/20/21 1102   11/20/21 0930  azithromycin (ZITHROMAX) 500 mg in sodium chloride 0.9 % 250 mL IVPB        500 mg 250 mL/hr over 60 Minutes Intravenous  Once 11/20/21 0926 11/20/21 1208       Assessment/Plan: s/p Procedure(s): AMPUTATION TOE (Right) Assessment: PVD with osteomyelitis right fourth toe amputation stump.  Plan: Discussed with the patient the MRI results.  Discussed the need for completion of the amputation on the right fourth toe.  Discussed possible risks and complications to the procedure and anesthesia including inability of the wound to heal due to her circulation or continued infection and no guarantees could be given as to the outcome.  Questions invited and answered.  Patient currently n.p.o.  Obtain consent for amputation right fourth toe.  Plan is for surgery this evening.  LOS: 2 days    Durward Fortes 11/23/2021

## 2021-11-23 NOTE — Consult Note (Signed)
Pharmacy Antibiotic Note  [name] is a 76 y.o. female with PVD w/ recent partial amputation of rt ft 4th toe returning on 11/20/2021 with pain found to have persistent pain and osteomyelitis on rt ft 4th toe; plan revision/amputate on 1/4.  Pharmacy has been consulted for vancomycin dosing.  Plan: Vancomycin 500 IV every 12 hours AUC goal: 400-550 Est AUC: 496.8, Cmax: 27.3, Cmin: 15.6 Parameters used: Scr rounded to 0.8, IBW, Vd: 0.72  Also receiving CTX   Height: 5\' 4"  (162.6 cm) Weight: 57.5 kg (126 lb 12.2 oz) IBW/kg (Calculated) : 54.7  Temp (24hrs), Avg:98 F (36.7 C), Min:97.6 F (36.4 C), Max:98.4 F (36.9 C)  Recent Labs  Lab 11/20/21 0931 11/21/21 0325  WBC 8.7 11.3*  CREATININE 0.57 0.54     Estimated Creatinine Clearance: 53.3 mL/min (by C-G formula based on SCr of 0.54 mg/dL).    No Known Allergies  Antimicrobials this admission: ongoing Ceftriaxone (1/1); then (1/4>> Vancomycin (1/4>>  completed Azithromycin (1/1 >> 1/3)  Dose adjustments this admission: Renal function at baseline, will adjust as needed  Microbiology results: 1/1 BCx: ng x 3d 1/4 MRSA PCR: pending  Thank you for allowing pharmacy to be a part of this patients care.  Owens Loffler, PharmD Candidate 11/23/2021 1:47 PM

## 2021-11-23 NOTE — Progress Notes (Signed)
OT Cancellation Note  Patient Details Name: [name] MRN: [id number] DOB: 31-Jan-1946   Cancelled Treatment:    Reason Eval/Treat Not Completed: Medical issues which prohibited therapy. Per chart review, pt is scheduled for surgery. OT will complete orders at this time. Please re-consult when appropriate to participate in OT intervention.   Darleen Crocker, [place], OTR/L , CBIS ascom [phone]  11/23/21, 12:55 PM

## 2021-11-23 NOTE — H&P (View-Only) (Signed)
Subjective/Chief Complaint: Patient seen.  Still pain in the right foot but currently doing a little better.   Objective: Vital signs in last 24 hours: Temp:  [97.6 F (36.4 C)-98.4 F (36.9 C)] 98.3 F (36.8 C) (01/04 1133) Pulse Rate:  [75-93] 92 (01/04 1133) Resp:  [15-19] 19 (01/04 1133) BP: (109-147)/(67-79) 109/67 (01/04 1133) SpO2:  [91 %-93 %] 91 % (01/04 1133) Last BM Date: 11/20/21  Intake/Output from previous day: 01/03 0701 - 01/04 0700 In: 240 [P.O.:240] Out: 2550 [Urine:2550] Intake/Output this shift: Total I/O In: -  Out: 775 [Urine:775]  Bandage on the right foot is dry and intact.  Upon removal minimal signs of any drainage.  No clear evidence of any open ulcerative area on the fifth toe.  Reviewed MRI images as well as radiologist read.  At this point think that the only clear evidence of infection is in the fourth toe.  Lab Results:  Recent Labs    11/21/21 0325  WBC 11.3*  HGB 9.5*  HCT 30.3*  PLT 331   BMET Recent Labs    11/21/21 0325  NA 138  K 3.6  CL 107  CO2 27  GLUCOSE 89  BUN 14  CREATININE 0.54  CALCIUM 8.3*   PT/INR No results for input(s): LABPROT, INR in the last 72 hours. ABG No results for input(s): PHART, HCO3 in the last 72 hours.  Invalid input(s): PCO2, PO2  Studies/Results: MR FOOT RIGHT WO CONTRAST  Result Date: 11/23/2021 CLINICAL DATA:  History of recent left fourth toe amputation with persistent foot pain. EXAM: MRI OF THE RIGHT FOREFOOT WITHOUT CONTRAST TECHNIQUE: Multiplanar, multisequence MR imaging of the right foot was performed. No intravenous contrast was administered. COMPARISON:  Radiographs 12/22/2020 FINDINGS: Remote complete second and third toe amputations. There is also partial amputation of the fourth toe. Part of the proximal phalanx is still present. There is abnormal T2 signal intensity in the remaining portion of the phalanx which could be related to recent surgery or osteomyelitis. There is  abnormal T1 and T2 signal intensity in the distal phalanx of the fifth toe highly suspicious for osteomyelitis. Signal changes in the fifth metacarpal head and neck appear to be due to a combination of bone infarct and healing fracture. 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 Healed or healing fracture or healing infarct involving the fourth metatarsal head. Abnormal T1 and T2 signal intensity in the distal tip of the distal phalanx of the great toe could be reactive or stress related change or focus of osteomyelitis. Mild subcutaneous soft tissue swelling/edema suggesting cellulitis. There is also diffuse myofasciitis without definite findings for pyomyositis. Small bone infarcts are noted in the talar dome and also in the talar head near its articulation with the navicular bone. There is also a small bone infarct noted in the cuboid and in the base of the fourth metatarsal. IMPRESSION: 1. Findings highly suspicious for osteomyelitis involving the distal phalanx of the fifth toe. 2. Signal changes in the fifth metacarpal head and neck appear to be due to a combination of bone infarct and healing fracture. 3. Abnormal T1 and T2 signal intensity in the distal tip of the distal phalanx of the great toe could be reactive or stress related change or focus of osteomyelitis. 4. Prior complete second and third toe amputations. 5. Evidence of multiple prior bone infarcts as above. 6. Mild cellulitis and diffuse myofasciitis without definite findings for pyomyositis. Electronically Signed  By: Marijo Sanes M.D.   On: 11/23/2021 09:17    Anti-infectives: Anti-infectives (From admission, onward)    Start     Dose/Rate Route Frequency Ordered Stop   11/23/21 1315  vancomycin (VANCOREADY) IVPB 1500 mg/300 mL        1,500 mg 150 mL/hr over 120 Minutes Intravenous  Once 11/23/21 1222     11/23/21 1300  cefTRIAXone (ROCEPHIN) 2 g in sodium chloride 0.9 %  100 mL IVPB        2 g 200 mL/hr over 30 Minutes Intravenous Every 24 hours 11/23/21 1147     11/21/21 1030  azithromycin (ZITHROMAX) tablet 500 mg        500 mg Oral Daily 11/21/21 0943 11/22/21 0920   11/20/21 0930  cefTRIAXone (ROCEPHIN) 2 g in sodium chloride 0.9 % 100 mL IVPB        2 g 200 mL/hr over 30 Minutes Intravenous  Once 11/20/21 0926 11/20/21 1102   11/20/21 0930  azithromycin (ZITHROMAX) 500 mg in sodium chloride 0.9 % 250 mL IVPB        500 mg 250 mL/hr over 60 Minutes Intravenous  Once 11/20/21 0926 11/20/21 1208       Assessment/Plan: s/p Procedure(s): AMPUTATION TOE (Right) Assessment: PVD with osteomyelitis right fourth toe amputation stump.  Plan: Discussed with the patient the MRI results.  Discussed the need for completion of the amputation on the right fourth toe.  Discussed possible risks and complications to the procedure and anesthesia including inability of the wound to heal due to her circulation or continued infection and no guarantees could be given as to the outcome.  Questions invited and answered.  Patient currently n.p.o.  Obtain consent for amputation right fourth toe.  Plan is for surgery this evening.  LOS: 2 days    Durward Fortes 11/23/2021

## 2021-11-23 NOTE — Anesthesia Preprocedure Evaluation (Signed)
Anesthesia Evaluation  Patient identified by MRN, date of birth, ID band Patient awake    Reviewed: Allergy & Precautions, NPO status , Patient's Chart, lab work & pertinent test results  History of Anesthesia Complications (+) DIFFICULT AIRWAY and history of anesthetic complications  Airway Mallampati: III  TM Distance: >3 FB Neck ROM: Limited    Dental  (+) Poor Dentition, Missing, Implants, Dental Advidsory Given   Pulmonary shortness of breath, COPD,  COPD inhaler, neg recent URI, Current Smoker and Patient abstained from smoking.,  Supplemental oxygen now  2L [place]  breath sounds clear to auscultation- rhonchi + wheezing      Cardiovascular Exercise Tolerance: Poor hypertension, Pt. on medications (-) angina+ CAD and + Peripheral Vascular Disease  (-) Past MI and (-) CABG + dysrhythmias (hx palpatations)  Rhythm:Regular Rate:Normal - Systolic murmurs and - Diastolic murmurs CAD, peripheral vascular disease status post mechanical thrombectomy and stent angioplasty of the right lower extremity with gangrene of the right toes in the past   Neuro/Psych neg Seizures PSYCHIATRIC DISORDERS Anxiety Depression  Neuromuscular disease    GI/Hepatic Neg liver ROS, hiatal hernia, neg GERD  ,  Endo/Other  neg diabetesHypothyroidism   Renal/GU negative Renal ROS     Musculoskeletal  (+) Arthritis ,   Abdominal (+) - obese,   Peds  Hematology negative hematology ROS (+)   Anesthesia Other Findings Past Medical History: No date: Anxiety No date: CAD (coronary artery disease) No date: Carpal tunnel syndrome No date: Cataract No date: Complication of anesthesia     Comment:  has woken up at times No date: COPD (chronic obstructive pulmonary disease) ([place]) 09/14/2015: CRP elevated No date: Depression No date: Difficult intubation No date: Dyslipidemia No date: Dyspnea     Comment:  DOE No date: Dysrhythmia     Comment:  hx  palpatations 09/14/2015: Elevated sedimentation rate No date: Esophageal spasm No date: Gastrointestinal parasites No date: GERD (gastroesophageal reflux disease) No date: Hiatal hernia No date: History of peptic ulcer disease No date: Hypertension No date: Hyperthyroidism No date: Hypothyroidism 09/14/2015: Low magnesium levels 2008: Pelvic fracture ([place])     Comment:  fall from riding a horse No date: Peripheral vascular disease (HCC) No date: Reflux No date: Rotator cuff injury No date: Stenosis, spinal, lumbar   Reproductive/Obstetrics                             Anesthesia Physical  Anesthesia Plan  ASA: 3  Anesthesia Plan: MAC   Post-op Pain Management:    Induction: Intravenous  PONV Risk Score and Plan: 1 and Propofol infusion  Airway Management Planned: Natural Airway  Additional Equipment:   Intra-op Plan:   Post-operative Plan:   Informed Consent: I have reviewed the patients History and Physical, chart, labs and discussed the procedure including the risks, benefits and alternatives for the proposed anesthesia with the patient or authorized representative who has indicated his/her understanding and acceptance.     Dental advisory given  Plan Discussed with: CRNA and Anesthesiologist  Anesthesia Plan Comments:         Anesthesia Quick Evaluation

## 2021-11-24 ENCOUNTER — Inpatient Hospital Stay: Payer: Medicare HMO

## 2021-11-24 ENCOUNTER — Encounter: Payer: Self-pay | Admitting: Podiatry

## 2021-11-24 DIAGNOSIS — E43 Unspecified severe protein-calorie malnutrition: Secondary | ICD-10-CM

## 2021-11-24 MED ORDER — ENSURE ENLIVE PO LIQD: 237.0000 mL | Freq: Every day | ORAL | Status: DC

## 2021-11-24 MED ORDER — VANCOMYCIN HCL 500 MG/100ML IV SOLN
500.0000 mg | Freq: Two times a day (BID) | INTRAVENOUS | Status: DC
  Administered 2021-11-24 – 2021-11-25 (×2): 500 mg via INTRAVENOUS
  Filled 2021-11-24 (×3): qty 100

## 2021-11-24 MED ORDER — MORPHINE SULFATE (PF) 2 MG/ML IV SOLN
2.0000 mg | INTRAVENOUS | Status: DC | PRN
  Administered 2021-11-24 – 2021-11-25 (×3): 2 mg via INTRAVENOUS
  Filled 2021-11-24 (×3): qty 1

## 2021-11-24 MED ORDER — LACTULOSE 10 GM/15ML PO SOLN
30.0000 g | Freq: Every day | ORAL | Status: DC | PRN
  Administered 2021-11-24: 16:00:00 30 g via ORAL
  Filled 2021-11-24: qty 60

## 2021-11-24 MED ORDER — METHYLPREDNISOLONE SODIUM SUCC 40 MG IJ SOLR
40.0000 mg | Freq: Every day | INTRAMUSCULAR | Status: DC
  Administered 2021-11-24 – 2021-11-25 (×2): 40 mg via INTRAVENOUS
  Filled 2021-11-24 (×2): qty 1

## 2021-11-24 MED ORDER — JUVEN PO PACK
1.0000 | PACK | Freq: Two times a day (BID) | ORAL | Status: DC
  Administered 2021-11-24 – 2021-11-25 (×3): 1 via ORAL

## 2021-11-24 MED ORDER — OXYCODONE HCL 5 MG PO TABS
10.0000 mg | ORAL_TABLET | Freq: Four times a day (QID) | ORAL | Status: DC | PRN
  Administered 2021-11-24 – 2021-11-25 (×4): 10 mg via ORAL
  Filled 2021-11-24 (×5): qty 2

## 2021-11-24 NOTE — Anesthesia Postprocedure Evaluation (Signed)
Anesthesia Post Note  Patient: [place]  Procedure(s) Performed: AMPUTATION TOE (Right: Toe)  Patient location during evaluation: PACU Anesthesia Type: MAC Level of consciousness: awake and alert Pain management: pain level controlled Vital Signs Assessment: post-procedure vital signs reviewed and stable Respiratory status: spontaneous breathing, nonlabored ventilation, respiratory function stable and patient connected to nasal cannula oxygen Cardiovascular status: blood pressure returned to baseline and stable Postop Assessment: no apparent nausea or vomiting Anesthetic complications: no   No notable events documented.   Last Vitals:  Vitals:   11/23/21 1830 11/23/21 1849  BP: 122/76 140/70  Pulse: 91 88  Resp: 19 19  Temp: 36.9 C 37 C  SpO2: 95% 94%    Last Pain:  Vitals:   11/23/21 2234  TempSrc:   PainSc: Asleep                 Martha Clan

## 2021-11-24 NOTE — Progress Notes (Signed)
1 Day Post-Op   Subjective/Chief Complaint: Patient seen.  Still having a lot of pain in her right foot.   Objective: Vital signs in last 24 hours: Temp:  [97.7 F (36.5 C)-98.6 F (37 C)] 98.3 F (36.8 C) (01/05 1132) Pulse Rate:  [83-95] 95 (01/05 1132) Resp:  [16-19] 16 (01/05 1132) BP: (122-140)/(62-76) 124/68 (01/05 1132) SpO2:  [92 %-95 %] 95 % (01/05 1132) Last BM Date: 11/20/20 (per pt)  Intake/Output from previous day: 01/04 0701 - 01/05 0700 In: 1335.4 [I.V.:935.4; IV Piggyback:400] Out: 1779.9 [OEVOJ:5009; Blood:4.9] Intake/Output this shift: Total I/O In: 720 [P.O.:720] Out: -   Bandage on the right foot is dry and intact.  Lab Results:  No results for input(s): WBC, HGB, HCT, PLT in the last 72 hours. BMET No results for input(s): NA, K, CL, CO2, GLUCOSE, BUN, CREATININE, CALCIUM in the last 72 hours. PT/INR No results for input(s): LABPROT, INR in the last 72 hours. ABG No results for input(s): PHART, HCO3 in the last 72 hours.  Invalid input(s): PCO2, PO2  Studies/Results: MR FOOT RIGHT WO CONTRAST  Result Date: 11/23/2021 CLINICAL DATA:  History of recent left fourth toe amputation with persistent foot pain. EXAM: MRI OF THE RIGHT FOREFOOT WITHOUT CONTRAST TECHNIQUE: Multiplanar, multisequence MR imaging of the right foot was performed. No intravenous contrast was administered. COMPARISON:  Radiographs 12/22/2020 FINDINGS: Remote complete second and third toe amputations. There is also partial amputation of the fourth toe. Part of the proximal phalanx is still present. There is abnormal T2 signal intensity in the remaining portion of the phalanx which could be related to recent surgery or osteomyelitis. There is abnormal T1 and T2 signal intensity in the distal phalanx of the fifth toe highly suspicious for osteomyelitis. Signal changes in the fifth metacarpal head and neck appear to be due to a combination of bone infarct and healing fracture. There is also  evidence of prior bone infarct/Freiberg's infraction involving the second metatarsal head. Healed or healing fracture or healing infarct involving the fourth metatarsal head. Abnormal T1 and T2 signal intensity in the distal tip of the distal phalanx of the great toe could be reactive or stress related change or focus of osteomyelitis. Mild subcutaneous soft tissue swelling/edema suggesting cellulitis. There is also diffuse myofasciitis without definite findings for pyomyositis. Small bone infarcts are noted in the talar dome and also in the talar head near its articulation with the navicular bone. There is also a small bone infarct noted in the cuboid and in the base of the fourth metatarsal. IMPRESSION: 1. Findings highly suspicious for osteomyelitis involving the distal phalanx of the fifth toe. 2. Signal changes in the fifth metacarpal head and neck appear to be due to a combination of bone infarct and healing fracture. 3. Abnormal T1 and T2 signal intensity in the distal tip of the distal phalanx of the great toe could be reactive or stress related change or focus of osteomyelitis. 4. Prior complete second and third toe amputations. 5. Evidence of multiple prior bone infarcts as above. 6. Mild cellulitis and diffuse myofasciitis without definite findings for pyomyositis. Electronically Signed   By: Marijo Sanes M.D.   On: 11/23/2021 09:17   DG Chest Port 1 View  Result Date: 11/24/2021 CLINICAL DATA:  76 year old female COPD.  Cough. EXAM: PORTABLE CHEST 1 VIEW COMPARISON:  Portable chest 11/20/2021 and earlier. FINDINGS: Portable AP upright view at 0838 hours. Stable lung volumes and mediastinal contours. Stable lung markings. Centrilobular emphysema on CT last  year. Allowing for portable technique the lungs are clear. No pneumothorax or pleural effusion. Visualized tracheal air column is within normal limits. Prior cervical ACDF. Stable visualized osseous structures. Partially visible gastric air in the  upper abdomen. IMPRESSION: No acute cardiopulmonary abnormality.  Emphysema (ICD10-J43.9). Electronically Signed   By: Genevie Ann M.D.   On: 11/24/2021 08:52    Anti-infectives: Anti-infectives (From admission, onward)    Start     Dose/Rate Route Frequency Ordered Stop   11/24/21 1800  vancomycin (VANCOREADY) IVPB 500 mg/100 mL        500 mg 100 mL/hr over 60 Minutes Intravenous Every 12 hours 11/24/21 1021     11/24/21 0600  vancomycin (VANCOREADY) IVPB 750 mg/150 mL  Status:  Discontinued        750 mg 150 mL/hr over 60 Minutes Intravenous Every 12 hours 11/23/21 1547 11/24/21 1021   11/23/21 1315  vancomycin (VANCOREADY) IVPB 1500 mg/300 mL        1,500 mg 150 mL/hr over 120 Minutes Intravenous  Once 11/23/21 1222 11/23/21 1453   11/23/21 1300  cefTRIAXone (ROCEPHIN) 2 g in sodium chloride 0.9 % 100 mL IVPB        2 g 200 mL/hr over 30 Minutes Intravenous Every 24 hours 11/23/21 1147     11/21/21 1030  azithromycin (ZITHROMAX) tablet 500 mg        500 mg Oral Daily 11/21/21 0943 11/22/21 0920   11/20/21 0930  cefTRIAXone (ROCEPHIN) 2 g in sodium chloride 0.9 % 100 mL IVPB        2 g 200 mL/hr over 30 Minutes Intravenous  Once 11/20/21 0926 11/20/21 1102   11/20/21 0930  azithromycin (ZITHROMAX) 500 mg in sodium chloride 0.9 % 250 mL IVPB        500 mg 250 mL/hr over 60 Minutes Intravenous  Once 11/20/21 0926 11/20/21 1208       Assessment/Plan: s/p Procedure(s): AMPUTATION TOE (Right) Assessment: Status post amputation right fourth toe.  Plan: Dressing left intact.  Spoke with hospitalist and patient will be in the hospital till at least tomorrow due to her breathing.  Plan for dressing change tomorrow and hopefully patient can be discharged on oral antibiotics and pain meds.  LOS: 3 days    Durward Fortes 11/24/2021

## 2021-11-24 NOTE — Progress Notes (Addendum)
Patient ID: [name], female   DOB: 03-31-1946, 76 y.o.   MRN: [id number] [facility] PROGRESS NOTE  [name] [facility] UUV:[id number] DOB: 20-Jul-1946 DOA: 11/20/2021 PCP: Albina Billet, MD  HPI/Subjective: Patient admitted with COPD exacerbation and found to have osteomyelitis of the toe.  Still has a little bit of shortness of breath and some cough.  Objective: Vitals:   11/24/21 0733 11/24/21 1132  BP: 134/69 124/68  Pulse: 83 95  Resp: 16 16  Temp: 98 F (36.7 C) 98.3 F (36.8 C)  SpO2: 94% 95%    Intake/Output Summary (Last 24 hours) at 11/24/2021 1157 Last data filed at 11/24/2021 1017 Gross per 24 hour  Intake 1815.36 ml  Output 1004.9 ml  Net 810.46 ml   Filed Weights   11/20/21 0926 11/22/21 0500  Weight: 60.8 kg 57.5 kg    ROS: Review of Systems  Respiratory:  Positive for cough and shortness of breath.   Cardiovascular:  Negative for chest pain.  Gastrointestinal:  Negative for abdominal pain, nausea and vomiting.  Exam: Physical Exam HENT:     Head: Normocephalic.     Mouth/Throat:     Pharynx: No oropharyngeal exudate.  Eyes:     General: Lids are normal.     Conjunctiva/sclera: Conjunctivae normal.  Cardiovascular:     Rate and Rhythm: Normal rate and regular rhythm.     Heart sounds: Normal heart sounds, S1 normal and S2 normal.  Pulmonary:     Breath sounds: Examination of the right-middle field reveals wheezing. Examination of the left-middle field reveals wheezing. Examination of the right-lower field reveals decreased breath sounds and wheezing. Examination of the left-lower field reveals decreased breath sounds and wheezing. Decreased breath sounds and wheezing present. No rhonchi or rales.  Abdominal:     Palpations: Abdomen is soft.     Tenderness: There is no abdominal tenderness.  Musculoskeletal:     Right lower leg: No swelling.     Left lower leg: No swelling.  Skin:    General: Skin is warm.     Findings: No rash.     Comments:  Right foot wrapped  Neurological:     Mental Status: She is alert and oriented to person, place, and time.      Scheduled Meds:  amLODipine  5 mg Oral Daily   atorvastatin  10 mg Oral QPM   buPROPion  150 mg Oral Daily   calcium carbonate  1 tablet Oral TID WC   clopidogrel  75 mg Oral Daily   enoxaparin (LOVENOX) injection  40 mg Subcutaneous Q24H   escitalopram  10 mg Oral Q1500   [START ON 11/25/2021] feeding supplement  237 mL Oral QHS   levothyroxine  88 mcg Oral QAC breakfast   methylPREDNISolone (SOLU-MEDROL) injection  40 mg Intravenous Daily   montelukast  10 mg Oral Daily   multivitamin with minerals  1 tablet Oral Daily   nutrition supplement (JUVEN)  1 packet Oral BID BM   pantoprazole  40 mg Oral Q0600   polyethylene glycol  17 g Oral BID   senna-docusate  2 tablet Oral BID   umeclidinium-vilanterol  1 puff Inhalation Daily   Vitamin D (Ergocalciferol)  50,000 Units Oral Q7 days   Continuous Infusions:  cefTRIAXone (ROCEPHIN)  IV 2 g (11/23/21 1252)   vancomycin      Assessment/Plan:  Osteomyelitis right toe.  Dr Cleda Mccreedy podiatry did a right fourth toe amputation on 11/23/20.  Empiric antibiotics of vancomycin  and Rocephin for now.  Follow-up bone culture results. COPD exacerbation.  Patient still wheezing.  Continue nebulizer treatments and Solu-Medrol.  Repeat chest x-ray today does not show any pneumonia. Essential hypertension on Norvasc Hyperlipidemia unspecified on atorvastatin Anxiety and depression on Lexapro Wellbutrin and Xanax Benzodiazepine dependence on Xanax Hypothyroidism unspecified on levothyroxine Peripheral vascular disease on Plavix Vitamin D deficiency.  Continue vitamin D supplementation started today. Severe malnutrition in context of chronic illness        Code Status:     Code Status Orders  (From admission, onward)           Start     Ordered   11/20/21 1245  Full code  Continuous        11/20/21 1245           Code  Status History     Date Active Date Inactive Code Status Order ID Comments User Context   10/19/2021 0039 10/23/2021 1929 Full Code [id number]  Para Skeans, MD ED   04/22/2021 1047 04/22/2021 1718 Full Code [id number]  Samara Deist, DPM Inpatient   01/11/2021 2147 01/20/2021 2152 Full Code [id number]  Lenore Cordia, MD ED   12/22/2020 1123 01/01/2021 2345 Full Code [id number]  Collier Bullock, MD ED   10/22/2018 0225 10/24/2018 1650 Full Code [id number]  Lance Coon, MD Inpatient   08/18/2018 0248 08/21/2018 2036 Full Code [id number]  Amelia Jo, MD Inpatient   10/01/2016 1138 10/04/2016 1744 Full Code [id number]  Fritzi Mandes, MD Inpatient      Family Communication: Updated grandson on the phone [name] at [phone] Disposition Plan: Status is: Inpatient.   Consultants: Podiatry  Procedures: Right fourth toe amputation  Antibiotics: Vancomycin and Rocephin  [place]  [facility]

## 2021-11-24 NOTE — Progress Notes (Addendum)
Nutrition Follow-up  DOCUMENTATION CODES:   Severe malnutrition in context of chronic illness  INTERVENTION:   -Decrease Ensure Enlive to daily, each supplement provides 350 kcal and 20 grams of protein  -1 packet Juven BID, each packet provides 95 calories, 2.5 grams of protein (collagen), and 9.8 grams of carbohydrate (3 grams sugar); also contains 7 grams of L-arginine and L-glutamine, 300 mg vitamin C, 15 mg vitamin E, 1.2 mcg vitamin B-12, 9.5 mg zinc, 200 mg calcium, and 1.5 g  Calcium Beta-hydroxy-Beta-methylbutyrate to support wound healing  -Continue MVI with minerals daily  -Magic cup BID with meals, each supplement provides 290 kcal and 9 grams of protein   NUTRITION DIAGNOSIS:   Severe Malnutrition related to chronic illness (PVD, COPD) as evidenced by severe fat depletion, severe muscle depletion.  Ongoing  GOAL:   Patient will meet greater than or equal to 90% of their needs  Progressing   MONITOR:   PO intake, Supplement acceptance, Labs, Weight trends, I & O's, Skin  REASON FOR ASSESSMENT:   Consult, Malnutrition Screening Tool Assessment of nutrition requirement/status  ASSESSMENT:   76 yo female with a PMH of COPD, SOB, PVD, GERD, s/p toe amputations, and anxiety (pt states out of inhalers and Xanax x 2 days), pt also c/o dehydration (states no food/drink since yesterday d/t issues with her dishwasher). Admitted with COPD exacerbation.  1/4- s/p Procedure: Amputation right fourth toe.   Reviewed I/O's: -445 ml x 24 hours and -6 L since admission  UOP: 1.8 L x 24 hours  Pt unavailable at time of visit. Attempted to speak with pt via call to hospital room phone, however, unable to reach.   Pt remains with good oral intake. Noted meal completions 50-100%. Pt is consuming Ensure Enlive supplements.   Medications reviewed and include miralax, senokot, vitamin D, and sodium chloride infusion and 50 ml/hr.   Labs reviewed.   Diet Order:   Diet Order              Diet regular Room service appropriate? Yes; Fluid consistency: Thin  Diet effective now                   EDUCATION NEEDS:   Education needs have been addressed  Skin:  Skin Assessment: Skin Integrity Issues: Skin Integrity Issues:: Incisions Incisions: closed rt foot  Last BM:  11/20/20  Height:   Ht Readings from Last 1 Encounters:  11/20/21 5\' 4"  (1.626 m)    Weight:   Wt Readings from Last 1 Encounters:  11/22/21 57.5 kg   BMI:  Body mass index is 21.76 kg/m.  Estimated Nutritional Needs:   Kcal:  1800-2000  Protein:  100-115 grams  Fluid:  >1.8 L    Loistine Chance, RD, LDN, [place] Registered Dietitian II Certified Diabetes Care and Education Specialist Please refer to [facility] for RD and/or RD on-call/weekend/after hours pager

## 2021-11-25 LAB — CBC
HCT: 30.5 % — ABNORMAL LOW (ref 36.0–46.0)
Hemoglobin: 9.5 g/dL — ABNORMAL LOW (ref 12.0–15.0)
MCH: 26.9 pg (ref 26.0–34.0)
MCHC: 31.1 g/dL (ref 30.0–36.0)
MCV: 86.4 fL (ref 80.0–100.0)
Platelets: 394 10*3/uL (ref 150–400)
RBC: 3.53 MIL/uL — ABNORMAL LOW (ref 3.87–5.11)
RDW: 14.2 % (ref 11.5–15.5)
WBC: 8.6 10*3/uL (ref 4.0–10.5)
nRBC: 0 % (ref 0.0–0.2)

## 2021-11-25 LAB — CULTURE, BLOOD (ROUTINE X 2)
Culture: NO GROWTH
Culture: NO GROWTH
Special Requests: ADEQUATE
Special Requests: ADEQUATE

## 2021-11-25 LAB — SURGICAL PATHOLOGY

## 2021-11-25 LAB — BASIC METABOLIC PANEL
Anion gap: 4 — ABNORMAL LOW (ref 5–15)
BUN: 17 mg/dL (ref 8–23)
CO2: 28 mmol/L (ref 22–32)
Calcium: 8.4 mg/dL — ABNORMAL LOW (ref 8.9–10.3)
Chloride: 108 mmol/L (ref 98–111)
Creatinine, Ser: 0.56 mg/dL (ref 0.44–1.00)
GFR, Estimated: >60 mL/min (ref >60)
Glucose, Bld: 86 mg/dL (ref 70–99)
Potassium: 3 mmol/L — ABNORMAL LOW (ref 3.5–5.1)
Sodium: 140 mmol/L (ref 135–145)

## 2021-11-25 MED ORDER — ENSURE ENLIVE PO LIQD: 237.0000 mL | Freq: Every day | ORAL | 0 refills | Status: DC

## 2021-11-25 MED ORDER — OXYCODONE HCL 10 MG PO TABS: 10.0000 mg | ORAL_TABLET | Freq: Four times a day (QID) | ORAL | 0 refills | Status: DC | PRN

## 2021-11-25 MED ORDER — DOXYCYCLINE HYCLATE 100 MG PO TABS
100.0000 mg | ORAL_TABLET | Freq: Two times a day (BID) | ORAL | 0 refills | Status: DC
Start: 2021-11-25 — End: 2022-02-14

## 2021-11-25 MED ORDER — FLUTICASONE-SALMETEROL 250-50 MCG/DOSE IN AEPB: 1.0000 | INHALATION_SPRAY | Freq: Two times a day (BID) | RESPIRATORY_TRACT | 0 refills | Status: DC

## 2021-11-25 MED ORDER — SENNOSIDES-DOCUSATE SODIUM 8.6-50 MG PO TABS: 2.0000 | ORAL_TABLET | Freq: Every day | ORAL | 0 refills | Status: DC

## 2021-11-25 MED ORDER — NICOTINE 21 MG/24HR TD PT24: MEDICATED_PATCH | TRANSDERMAL | 0 refills | Status: DC

## 2021-11-25 MED ORDER — PREDNISONE 10 MG PO TABS: ORAL_TABLET | ORAL | 0 refills | Status: DC

## 2021-11-25 MED ORDER — POTASSIUM CHLORIDE CRYS ER 20 MEQ PO TBCR
40.0000 meq | EXTENDED_RELEASE_TABLET | Freq: Once | ORAL | Status: AC
  Administered 2021-11-25: 40 meq via ORAL
  Filled 2021-11-25: qty 2

## 2021-11-25 MED ORDER — AMOXICILLIN-POT CLAVULANATE 875-125 MG PO TABS: 1.0000 | ORAL_TABLET | Freq: Two times a day (BID) | ORAL | 0 refills | Status: DC

## 2021-11-25 MED ORDER — JUVEN PO PACK: 1.0000 | PACK | Freq: Two times a day (BID) | ORAL | 0 refills | Status: DC

## 2021-11-25 MED ORDER — COMBIVENT RESPIMAT 20-100 MCG/ACT IN AERS
1.0000 | INHALATION_SPRAY | Freq: Four times a day (QID) | RESPIRATORY_TRACT | 0 refills | Status: DC
Start: 2021-11-25 — End: 2023-05-21

## 2021-11-25 MED ORDER — POLYETHYLENE GLYCOL 3350 17 G PO PACK: 17.0000 g | PACK | Freq: Every day | ORAL | 0 refills | Status: DC

## 2021-11-25 MED ORDER — VITAMIN D (ERGOCALCIFEROL) 1.25 MG (50000 UNIT) PO CAPS: 50000.0000 [IU] | ORAL_CAPSULE | ORAL | 0 refills | Status: DC

## 2021-11-25 NOTE — Discharge Instructions (Addendum)
I prescribed a few days of higher dose pain medication for you.  You can go back on your chronic pain medication through pain management after you complete my prescription.  Keep dressing on foot intact until seen by podiatry one week

## 2021-11-25 NOTE — Progress Notes (Signed)
Received Md order to discharge patient to home, reviewed  discharge instructions, home meds, follow up appointments and prescriptions with patient and patient verbalized understanding

## 2021-11-25 NOTE — Discharge Summary (Signed)
[facility] [place] [facility] [place] NAME: [name]    MR#:  [id number]  DATE OF BIRTH:  09/05/1946  DATE OF ADMISSION:  11/20/2021 ADMITTING PHYSICIAN: Hosie Poisson, MD  DATE OF DISCHARGE: 11/25/2021  1:38 PM  PRIMARY CARE PHYSICIAN: Albina Billet, MD    ADMISSION DIAGNOSIS:  COPD exacerbation ([place]) [J44.1] Acute respiratory failure with hypoxemia ([place]) [J96.01] Benzodiazepine withdrawal without complication ([place]) [A25.053] COPD with acute exacerbation ([place]) [J44.1]  DISCHARGE DIAGNOSIS:  Principal Problem:   COPD exacerbation ([place]) Active Problems:   Hypothyroidism   Anxiety and depression   Long term prescription opiate use   Chronic pain - multiple sites arthritis   Benzodiazepine dependence, continuous ([place])   Peripheral vascular disease ([place])   Hyponatremia   Hypocalcemia   Hypoalbuminemia due to protein-calorie malnutrition (HCC)   Anemia   Protein-calorie malnutrition, severe   COPD with acute exacerbation (HCC)   Subacute osteomyelitis of right foot ([place])   Essential hypertension   SECONDARY DIAGNOSIS:   Past Medical History:  Diagnosis Date   Anxiety    CAD (coronary artery disease)    Carpal tunnel syndrome    Cataract    Complication of anesthesia    has woken up at times   COPD (chronic obstructive pulmonary disease) (HCC)    CRP elevated 09/14/2015   Depression    Difficult intubation    Dyslipidemia    Dyspnea    DOE   Dysrhythmia    hx palpatations   Elevated sedimentation rate 09/14/2015   Esophageal spasm    Gastrointestinal parasites    GERD (gastroesophageal reflux disease)    HCAP (healthcare-associated pneumonia) 10/22/2018   Hiatal hernia    History of peptic ulcer disease    Hypertension    Hyperthyroidism    Hypothyroidism    Low magnesium levels 09/14/2015   Pelvic fracture ([place]) 2008   fall from riding a horse   Peripheral vascular disease (HCC)    Reflux    Rotator  cuff injury    Sepsis ([place]) 10/22/2018   Stenosis, spinal, lumbar     HOSPITAL COURSE:   Osteomyelitis right toe.  Patient was started on Rocephin and vancomycin prior to surgery.  Dr. Cleda Mccreedy did a right fourth toe amputation on 11/23/2020.  He states that the wound looked good and was okay with discharge home and keep the bandage on until he sees her next week.  He believes that there is not osteomyelitis in the fifth toe and it was only the fourth toe.  Prior to discharge I was notified that the bone culture results growing staph aureus.  I initially was going to prescribe Augmentin for a week but I changed over to doxycycline for 1 week.  I prescribed 3 days worth of pain medications upon going home.  The patient will follow-up with pain management and can go back on her chronic pain regimen after those 3 days. COPD exacerbation.  After coughing the patient's lungs are little coarse but improved from the last few days.  Will prescribe a prednisone taper upon going home.  Repeat chest x-ray does not show any pneumonia.  Refilled her Advair and Combivent also. Essential hypertension on Norvasc Hyperlipidemia unspecified on atorvastatin Anxiety depression on Lexapro Wellbutrin and Xanax Benzodiazepine dependence on Xanax Hypothyroidism unspecified on levothyroxine Peripheral vascular disease on Plavix Vitamin D deficiency.  Continue vitamin D supplementation as outpatient Severe malnutrition in context of chronic illness. Hypokalemia replace potassium during the hospital course  Hyponatremia with a sodium 133 upon coming in.  Normal range upon discharge Iron deficiency anemia.  Last hemoglobin 9.5.  Continue to monitor as outpatient  DISCHARGE CONDITIONS:   Satisfactory  CONSULTS OBTAINED:  Treatment Team:  Sharlotte Alamo, DPM  DRUG ALLERGIES:  No Known Allergies  DISCHARGE MEDICATIONS:   Allergies as of 11/25/2021   No Known Allergies      Medication List     STOP taking these  medications    apixaban 5 MG Tabs tablet Commonly known as: ELIQUIS   oxyCODONE-acetaminophen 10-325 MG tablet Commonly known as: PERCOCET   tizanidine 2 MG capsule Commonly known as: ZANAFLEX       TAKE these medications    albuterol (2.5 MG/3ML) 0.083% nebulizer solution Commonly known as: PROVENTIL Take 3 mLs (2.5 mg total) by nebulization every 4 (four) hours as needed for wheezing or shortness of breath. What changed: Another medication with the same name was removed. Continue taking this medication, and follow the directions you see here.   ALPRAZolam 1 MG tablet Commonly known as: XANAX Take 1 mg by mouth 3 (three) times daily as needed.   amLODipine 5 MG tablet Commonly known as: NORVASC Take 5 mg by mouth daily.   aspirin EC 81 MG tablet Take 1 tablet (81 mg total) by mouth daily.   atorvastatin 10 MG tablet Commonly known as: Lipitor Take 1 tablet (10 mg total) by mouth daily.   buPROPion 150 MG 12 hr tablet Commonly known as: WELLBUTRIN SR Take 150 mg by mouth daily.   clopidogrel 75 MG tablet Commonly known as: PLAVIX Take 75 mg by mouth daily.   Combivent Respimat 20-100 MCG/ACT Aers respimat Generic drug: Ipratropium-Albuterol Inhale 1-2 puffs into the lungs 4 (four) times daily.   doxycycline 100 MG tablet Commonly known as: VIBRA-TABS Take 1 tablet (100 mg total) by mouth 2 (two) times daily.   escitalopram 10 MG tablet Commonly known as: LEXAPRO Take 10 mg by mouth daily in the afternoon.   feeding supplement Liqd Take 237 mLs by mouth at bedtime.   nutrition supplement (JUVEN) Pack Take 1 packet by mouth 2 (two) times daily between meals.   Fluticasone-Salmeterol 250-50 MCG/DOSE Aepb Commonly known as: ADVAIR Inhale 1 puff into the lungs 2 (two) times daily.   levothyroxine 88 MCG tablet Commonly known as: SYNTHROID Take 88 mcg by mouth daily before breakfast.   montelukast 10 MG tablet Commonly known as: SINGULAIR Take 10 mg  by mouth daily.   nicotine 21 mg/24hr patch Commonly known as: NICODERM CQ - dosed in mg/24 hours 1 patch chest wall daily (okay to substitute generic)   oxyCODONE 5 MG immediate release tablet Commonly known as: Oxy IR/ROXICODONE Take 1 tablet (5 mg total) by mouth 2 (two) times daily as needed for severe pain. Must last 30 days What changed: Another medication with the same name was changed. Make sure you understand how and when to take each.   Oxycodone HCl 10 MG Tabs Take 1 tablet (10 mg total) by mouth every 6 (six) hours as needed for up to 3 days for moderate pain. What changed: reasons to take this   polyethylene glycol 17 g packet Commonly known as: MIRALAX / GLYCOLAX Take 17 g by mouth daily.   predniSONE 10 MG tablet Commonly known as: DELTASONE 3 tabs po day 1; 2 tabs po day 2,3; 1 tab po day 4,5, 1/2 tab po day6,7 Start taking on: November 26, 2021   senna-docusate 8.6-50 MG  tablet Commonly known as: Senokot-S Take 2 tablets by mouth at bedtime.   traZODone 150 MG tablet Commonly known as: DESYREL Take 300 mg by mouth at bedtime as needed.   Vitamin D (Ergocalciferol) 1.25 MG (50000 UNIT) Caps capsule Commonly known as: DRISDOL Take 1 capsule (50,000 Units total) by mouth every 7 (seven) days. Start taking on: December 01, 2021         DISCHARGE INSTRUCTIONS:   Follow-up with Dr. Cleda Mccreedy podiatry 1 week Follow-up PMD 5 days Follow-up with your pain management doctor  If you experience worsening of your admission symptoms, develop shortness of breath, life threatening emergency, suicidal or homicidal thoughts you must seek medical attention immediately by calling 911 or calling your MD immediately  if symptoms less severe.  You Must read complete instructions/literature along with all the possible adverse reactions/side effects for all the Medicines you take and that have been prescribed to you. Take any new Medicines after you have completely understood and  accept all the possible adverse reactions/side effects.   Please note  You were cared for by a hospitalist during your hospital stay. If you have any questions about your discharge medications or the care you received while you were in the hospital after you are discharged, you can call the unit and asked to speak with the hospitalist on call if the hospitalist that took care of you is not available. Once you are discharged, your primary care physician will handle any further medical issues. Please note that NO REFILLS for any discharge medications will be authorized once you are discharged, as it is imperative that you return to your primary care physician (or establish a relationship with a primary care physician if you do not have one) for your aftercare needs so that they can reassess your need for medications and monitor your lab values.    Today   CHIEF COMPLAINT:   Chief Complaint  Patient presents with   Respiratory Distress   Anxiety    HISTORY OF PRESENT ILLNESS:  [name]  is a 76 y.o. female came in with shortness of breath and admitted with COPD exacerbation   VITAL SIGNS:  Blood pressure 116/72, pulse 99, temperature 98.2 F (36.8 C), temperature source Oral, resp. rate 16, height 5\' 4"  (1.626 m), weight 54.3 kg, SpO2 91 %.  I/O:   Intake/Output Summary (Last 24 hours) at 11/25/2021 1514 Last data filed at 11/25/2021 0900 Gross per 24 hour  Intake 680.03 ml  Output 1402 ml  Net -721.97 ml    PHYSICAL EXAMINATION:  GENERAL:  76 y.o.-year-old patient lying in the bed with no acute distress.  EYES: Pupils equal, round, reactive to light and accommodation. No scleral icterus.  HEENT: Head atraumatic, normocephalic. Oropharynx and nasopharynx clear.  LUNGS: Decreased breath sounds bilaterally, after coughing no wheezing but coarse breath sounds at the bases. No use of accessory muscles of respiration.  CARDIOVASCULAR: S1, S2 normal. No murmurs, rubs, or gallops.   ABDOMEN: Soft, non-tender, non-distended.  EXTREMITIES: No pedal edema.  NEUROLOGIC: Cranial nerves II through XII are intact. Muscle strength 5/5 in all extremities. Sensation intact. Gait not checked.  PSYCHIATRIC: The patient is alert and oriented x 3.  SKIN: Right foot covered  DATA REVIEW:   CBC Recent Labs  Lab 11/25/21 0519  WBC 8.6  HGB 9.5*  HCT 30.5*  PLT 394    Chemistries  Recent Labs  Lab 11/20/21 0931 11/21/21 0325 11/25/21 0519  NA 133*   < >  140  K 4.4   < > 3.0*  CL 101   < > 108  CO2 21*   < > 28  GLUCOSE 84   < > 86  BUN 11   < > 17  CREATININE 0.57   < > 0.56  CALCIUM 8.6*   < > 8.4*  AST 37  --   --   ALT 13  --   --   ALKPHOS 66  --   --   BILITOT 1.2  --   --    < > = values in this interval not displayed.    Microbiology Results  Results for orders placed or performed during the hospital encounter of 11/20/21  Resp Panel by RT-PCR (Flu A&B, Covid) Nasopharyngeal Swab     Status: None   Collection Time: 11/20/21  9:31 AM   Specimen: Nasopharyngeal Swab; Nasopharyngeal(NP) swabs in vial transport medium  Result Value Ref Range Status   SARS Coronavirus 2 by RT PCR NEGATIVE NEGATIVE Final    Comment: (NOTE) SARS-CoV-2 target nucleic acids are NOT DETECTED.  The SARS-CoV-2 RNA is generally detectable in upper respiratory specimens during the acute phase of infection. The lowest concentration of SARS-CoV-2 viral copies this assay can detect is 138 copies/mL. A negative result does not preclude SARS-Cov-2 infection and should not be used as the sole basis for treatment or other patient management decisions. A negative result may occur with  improper specimen collection/handling, submission of specimen other than nasopharyngeal swab, presence of viral mutation(s) within the areas targeted by this assay, and inadequate number of viral copies(<138 copies/mL). A negative result must be combined with clinical observations, patient history, and  epidemiological information. The expected result is Negative.  Fact Sheet for Patients:  EntrepreneurPulse.com.au  Fact Sheet for Healthcare Providers:  IncredibleEmployment.be  This test is no t yet approved or cleared by the [place] FDA and  has been authorized for detection and/or diagnosis of SARS-CoV-2 by FDA under an Emergency Use Authorization (EUA). This EUA will remain  in effect (meaning this test can be used) for the duration of the COVID-19 declaration under Section 564(b)(1) of the Act, 21 U.S.C.section 360bbb-3(b)(1), unless the authorization is terminated  or revoked sooner.       Influenza A by PCR NEGATIVE NEGATIVE Final   Influenza B by PCR NEGATIVE NEGATIVE Final    Comment: (NOTE) The Xpert Xpress SARS-CoV-2/FLU/RSV plus assay is intended as an aid in the diagnosis of influenza from Nasopharyngeal swab specimens and should not be used as a sole basis for treatment. Nasal washings and aspirates are unacceptable for Xpert Xpress SARS-CoV-2/FLU/RSV testing.  Fact Sheet for Patients: EntrepreneurPulse.com.au  Fact Sheet for Healthcare Providers: IncredibleEmployment.be  This test is not yet approved or cleared by the [place] FDA and has been authorized for detection and/or diagnosis of SARS-CoV-2 by FDA under an Emergency Use Authorization (EUA). This EUA will remain in effect (meaning this test can be used) for the duration of the COVID-19 declaration under Section 564(b)(1) of the Act, 21 U.S.C. section 360bbb-3(b)(1), unless the authorization is terminated or revoked.  Performed at [facility], [address] [place], [place] [address]   Blood culture (routine x 2)     Status: None   Collection Time: 11/20/21  9:31 AM   Specimen: BLOOD  Result Value Ref Range Status   Specimen Description BLOOD LEFT ANTECUBITAL  Final   Special Requests   Final    BOTTLES  DRAWN AEROBIC  AND ANAEROBIC Blood Culture adequate volume   Culture   Final    NO GROWTH 5 DAYS Performed at [facility], [address] [place], [place]  [address]    Report Status 11/25/2021 FINAL  Final  Blood culture (routine x 2)     Status: None   Collection Time: 11/20/21  9:31 AM   Specimen: BLOOD  Result Value Ref Range Status   Specimen Description BLOOD BLOOD RIGHT WRIST  Final   Special Requests   Final    BOTTLES DRAWN AEROBIC AND ANAEROBIC Blood Culture adequate volume   Culture   Final    NO GROWTH 5 DAYS Performed at [facility], [address], [place], [place] [address]    Report Status 11/25/2021 FINAL  Final  MRSA Next Gen by PCR, Nasal     Status: None   Collection Time: 11/23/21 12:35 PM   Specimen: Nasal Mucosa; Nasal Swab  Result Value Ref Range Status   MRSA by PCR Next Gen NOT DETECTED NOT DETECTED Final    Comment: (NOTE) The GeneXpert MRSA Assay (FDA approved for NASAL specimens only), is one component of a comprehensive MRSA colonization surveillance program. It is not intended to diagnose MRSA infection nor to guide or monitor treatment for MRSA infections. Test performance is not FDA approved in patients less than 64 years old. Performed at [facility], [place], [place] [address]   Aerobic/Anaerobic Culture w Gram Stain (surgical/deep wound)     Status: None (Preliminary result)   Collection Time: 11/23/21  5:10 PM   Specimen: PATH Other; Tissue  Result Value Ref Range Status   Specimen Description   Final    TOE 4TH TOE BONE Performed at [facility], [address] [place], [place] [address]    Special Requests   Final    NONE Performed at [facility], [place], [place] [address]    Gram Stain NO WBC SEEN NO ORGANISMS SEEN   Final   Culture   Final    RARE STAPHYLOCOCCUS AUREUS SUSCEPTIBILITIES TO FOLLOW CRITICAL RESULT CALLED TO, READ BACK BY AND  VERIFIED WITH: RN C.BENNETT AT 1308 ON 11/25/2021 BY T.SAAD. Performed at [facility], [place] [address], [place], [place] [address]    Report Status PENDING  Incomplete    RADIOLOGY:  DG Chest Port 1 View  Result Date: 11/24/2021 CLINICAL DATA:  76 year old female COPD.  Cough. EXAM: PORTABLE CHEST 1 VIEW COMPARISON:  Portable chest 11/20/2021 and earlier. FINDINGS: Portable AP upright view at 0838 hours. Stable lung volumes and mediastinal contours. Stable lung markings. Centrilobular emphysema on CT last year. Allowing for portable technique the lungs are clear. No pneumothorax or pleural effusion. Visualized tracheal air column is within normal limits. Prior cervical ACDF. Stable visualized osseous structures. Partially visible gastric air in the upper abdomen. IMPRESSION: No acute cardiopulmonary abnormality.  Emphysema (ICD10-J43.9). Electronically Signed   By: Genevie Ann M.D.   On: 11/24/2021 08:52      Management plans discussed with the patient, family and they are in agreement.  CODE STATUS:     Code Status Orders  (From admission, onward)           Start     Ordered   11/20/21 1245  Full code  Continuous        11/20/21 1245           Code Status History     Date  Active Date Inactive Code Status Order ID Comments User Context   10/19/2021 0039 10/23/2021 1929 Full Code [id number]  Para Skeans, MD ED   04/22/2021 1047 04/22/2021 1718 Full Code [id number]  Samara Deist, [facility] Inpatient   01/11/2021 2147 01/20/2021 2152 Full Code [id number]  Lenore Cordia, MD ED   12/22/2020 1123 01/01/2021 2345 Full Code [id number]  Collier Bullock, MD ED   10/22/2018 0225 10/24/2018 1650 Full Code [id number]  Lance Coon, MD Inpatient   08/18/2018 0248 08/21/2018 2036 Full Code [id number]  Amelia Jo, MD Inpatient   10/01/2016 1138 10/04/2016 1744 Full Code [id number]  Fritzi Mandes, MD Inpatient       TOTAL TIME TAKING CARE OF THIS PATIENT: 35 minutes.    Loletha Grayer M.D on  11/25/2021 at 3:14 PM   [facility]  CC: Primary care physician; Albina Billet, MD

## 2021-11-25 NOTE — Progress Notes (Signed)
2 Days Post-Op   Subjective/Chief Complaint: Patient seen.  Still with complaints of pain in her right foot.  Woke her up at 4:00 this morning.   Objective: Vital signs in last 24 hours: Temp:  [97.4 F (36.3 C)-98.3 F (36.8 C)] 97.4 F (36.3 C) (01/06 0833) Pulse Rate:  [81-101] 81 (01/06 0833) Resp:  [16-18] 18 (01/06 0833) BP: (124-153)/(67-88) 129/67 (01/06 0833) SpO2:  [91 %-98 %] 91 % (01/06 0833) Weight:  [54.3 kg] 54.3 kg (01/06 0433) Last BM Date: 11/20/20 (per pt)  Intake/Output from previous day: 01/05 0701 - 01/06 0700 In: 1160 [P.O.:960; IV Piggyback:200] Out: 1402 [Urine:1401; Stool:1] Intake/Output this shift: No intake/output data recorded.  Bandage on the right foot is dry and intact.  Upon removal there is some mild dried blood on the bandaging.  Incision is well coapted with no drainage or sign of infection. Skin edges without signs of necrosis.   Lab Results:  Recent Labs    11/25/21 0519  WBC 8.6  HGB 9.5*  HCT 30.5*  PLT 394   BMET Recent Labs    11/25/21 0519  NA 140  K 3.0*  CL 108  CO2 28  GLUCOSE 86  BUN 17  CREATININE 0.56  CALCIUM 8.4*   PT/INR No results for input(s): LABPROT, INR in the last 72 hours. ABG No results for input(s): PHART, HCO3 in the last 72 hours.  Invalid input(s): PCO2, PO2  Studies/Results: DG Chest Port 1 View  Result Date: 11/24/2021 CLINICAL DATA:  76 year old female COPD.  Cough. EXAM: PORTABLE CHEST 1 VIEW COMPARISON:  Portable chest 11/20/2021 and earlier. FINDINGS: Portable AP upright view at 0838 hours. Stable lung volumes and mediastinal contours. Stable lung markings. Centrilobular emphysema on CT last year. Allowing for portable technique the lungs are clear. No pneumothorax or pleural effusion. Visualized tracheal air column is within normal limits. Prior cervical ACDF. Stable visualized osseous structures. Partially visible gastric air in the upper abdomen. IMPRESSION: No acute cardiopulmonary  abnormality.  Emphysema (ICD10-J43.9). Electronically Signed   By: Genevie Ann M.D.   On: 11/24/2021 08:52    Anti-infectives: Anti-infectives (From admission, onward)    Start     Dose/Rate Route Frequency Ordered Stop   11/25/21 0000  amoxicillin-clavulanate (AUGMENTIN) 875-125 MG tablet        1 tablet Oral 2 times daily 11/25/21 0957 12/02/21 2359   11/24/21 1800  vancomycin (VANCOREADY) IVPB 500 mg/100 mL        500 mg 100 mL/hr over 60 Minutes Intravenous Every 12 hours 11/24/21 1021     11/24/21 0600  vancomycin (VANCOREADY) IVPB 750 mg/150 mL  Status:  Discontinued        750 mg 150 mL/hr over 60 Minutes Intravenous Every 12 hours 11/23/21 1547 11/24/21 1021   11/23/21 1315  vancomycin (VANCOREADY) IVPB 1500 mg/300 mL        1,500 mg 150 mL/hr over 120 Minutes Intravenous  Once 11/23/21 1222 11/23/21 1453   11/23/21 1300  cefTRIAXone (ROCEPHIN) 2 g in sodium chloride 0.9 % 100 mL IVPB        2 g 200 mL/hr over 30 Minutes Intravenous Every 24 hours 11/23/21 1147     11/21/21 1030  azithromycin (ZITHROMAX) tablet 500 mg        500 mg Oral Daily 11/21/21 0943 11/22/21 0920   11/20/21 0930  cefTRIAXone (ROCEPHIN) 2 g in sodium chloride 0.9 % 100 mL IVPB        2 g  200 mL/hr over 30 Minutes Intravenous  Once 11/20/21 0926 11/20/21 1102   11/20/21 0930  azithromycin (ZITHROMAX) 500 mg in sodium chloride 0.9 % 250 mL IVPB        500 mg 250 mL/hr over 60 Minutes Intravenous  Once 11/20/21 0926 11/20/21 1208       Assessment/Plan: s/p Procedure(s): AMPUTATION TOE (Right) Assessment: Stable status post amputation right fourth toe.  Plan: Betadine and sterile bandage reapplied to the right foot.  Patient should be stable for discharge from podiatry standpoint with oral antibiotics and pain medication.  Recommend Augmentin.  Follow-up next week for reevaluation of her surgical site outpatient.  LOS: 4 days    Durward Fortes 11/25/2021

## 2021-11-25 NOTE — Care Management Important Message (Signed)
Important Message  Patient Details  Name: [name] MRN: [id number] Date of Birth: April 25, 1946   Medicare Important Message Given:  Yes     Juliann Pulse A Ria Redcay 11/25/2021, 9:49 AM

## 2021-11-25 NOTE — TOC Progression Note (Signed)
Transition of Care [facility]) - Progression Note    Patient Details  Name: [name] MRN: [id number] Date of Birth: Nov 05, 1946  Transition of Care [facility]) CM/SW Erhard, RN Phone Number: 11/25/2021, 10:11 AM  Clinical Narrative:   Patient will discharge today with Home Health, is currently active with [facility] home health.  [name] at [facility] acknowledges acceptance of patient and states they have been following her care at home with PT, OT and RN.    Patient states she has 2 care aides and her grandson assists her as well.  Message left for grandson re: discharge.    PT/OT has not seen patient following amputation but has seen patient prior. No equipment recommendations by therapy.   Patient states she feels comfortable returning home and grandson or care aides will transport patient to appointments.       Expected Discharge Plan and Services           Expected Discharge Date: 11/25/21                                     Social Determinants of Health (SDOH) Interventions    Readmission Risk Interventions Readmission Risk Prevention Plan 11/25/2021 01/14/2021  Transportation Screening Complete -  PCP or Specialist Appt within 3-5 Days - Complete  HRI or [place] - Complete  Social Work Consult for [facility] Planning/Counseling - Complete  Palliative Care Screening - Not Applicable  Medication Review Press photographer) Complete Complete  PCP or Specialist appointment within 3-5 days of discharge Complete -  [place] or Home Care Consult Complete -  Palliative Care Screening Not Applicable -  [place] Not Applicable -  Some recent data might be hidden

## 2021-11-28 ENCOUNTER — Encounter: Payer: Medicare HMO | Admitting: Pain Medicine

## 2021-11-29 LAB — AEROBIC/ANAEROBIC CULTURE W GRAM STAIN (SURGICAL/DEEP WOUND): Gram Stain: NONE SEEN

## 2022-02-14 ENCOUNTER — Ambulatory Visit: Payer: Medicare HMO | Admitting: Podiatry

## 2022-02-14 ENCOUNTER — Other Ambulatory Visit: Payer: Self-pay

## 2022-02-14 DIAGNOSIS — L539 Erythematous condition, unspecified: Secondary | ICD-10-CM

## 2022-02-14 DIAGNOSIS — I999 Unspecified disorder of circulatory system: Secondary | ICD-10-CM

## 2022-02-14 DIAGNOSIS — I739 Peripheral vascular disease, unspecified: Secondary | ICD-10-CM | POA: Diagnosis not present

## 2022-02-14 MED ORDER — OXYCODONE-ACETAMINOPHEN 10-325 MG PO TABS
1.0000 | ORAL_TABLET | Freq: Four times a day (QID) | ORAL | 0 refills | Status: AC | PRN
Start: 2022-02-14 — End: 2022-02-22

## 2022-02-14 MED ORDER — DOXYCYCLINE HYCLATE 100 MG PO TABS: 100.0000 mg | ORAL_TABLET | Freq: Two times a day (BID) | ORAL | 0 refills | Status: DC

## 2022-02-14 MED ORDER — DOXYCYCLINE HYCLATE 100 MG PO TABS: 100.0000 mg | ORAL_TABLET | Freq: Two times a day (BID) | ORAL | 0 refills | Status: AC

## 2022-02-14 NOTE — Progress Notes (Signed)
?Subjective:  ?Patient ID: [name], female    DOB: 1946/06/01,  MRN: [id number] ? ?Chief Complaint  ?Patient presents with  ? Foot Pain  ? ? ?76 y.o. female presents with the above complaint.  Patient presents with right foot pain that has been going on for quite some time.  She is well-known to [facility] but would like to switch to a doctor and get a second opinion.  She has had multiple toe amputations including second third and fourth digit done previously.  She wanted to evaluate the redness is present.  She does have a severe vascular history.  She was last treated by Dr. Lucky Cowboy which she did an angiogram on 10/20/2021.  She has a history of multiple stent stents on the right side.  However her flow was still occluded past the posterior tibial into the foot. ? ? ?Review of Systems: Negative except as noted in the HPI. Denies N/V/F/Ch. ? ?Past Medical History:  ?Diagnosis Date  ? Anxiety   ? CAD (coronary artery disease)   ? Carpal tunnel syndrome   ? Cataract   ? Complication of anesthesia   ? has woken up at times  ? COPD (chronic obstructive pulmonary disease) ([place])   ? CRP elevated 09/14/2015  ? Depression   ? Difficult intubation   ? Dyslipidemia   ? Dyspnea   ? DOE  ? Dysrhythmia   ? hx palpatations  ? Elevated sedimentation rate 09/14/2015  ? Esophageal spasm   ? Gastrointestinal parasites   ? GERD (gastroesophageal reflux disease)   ? HCAP (healthcare-associated pneumonia) 10/22/2018  ? Hiatal hernia   ? History of peptic ulcer disease   ? Hypertension   ? Hyperthyroidism   ? Hypothyroidism   ? Low magnesium levels 09/14/2015  ? Pelvic fracture ([place]) 2008  ? fall from riding a horse  ? Peripheral vascular disease ([place])   ? Reflux   ? Rotator cuff injury   ? Sepsis ([place]) 10/22/2018  ? Stenosis, spinal, lumbar   ? ? ?Current Outpatient Medications:  ?  doxycycline (VIBRA-TABS) 100 MG tablet, Take 1 tablet (100 mg total) by mouth 2 (two) times daily for 10 days., Disp: 20 tablet, Rfl: 0 ?   oxyCODONE-acetaminophen (PERCOCET) 10-325 MG tablet, Take 1 tablet by mouth every 6 (six) hours as needed for up to 8 days for pain., Disp: 30 tablet, Rfl: 0 ?  albuterol (PROVENTIL) (2.5 MG/3ML) 0.083% nebulizer solution, Take 3 mLs (2.5 mg total) by nebulization every 4 (four) hours as needed for wheezing or shortness of breath., Disp: 75 mL, Rfl: 2 ?  ALPRAZolam (XANAX) 1 MG tablet, Take 1 mg by mouth 3 (three) times daily as needed., Disp: , Rfl:  ?  amLODipine (NORVASC) 5 MG tablet, Take 5 mg by mouth daily., Disp: , Rfl:  ?  aspirin EC 81 MG tablet, Take 1 tablet (81 mg total) by mouth daily., Disp: 150 tablet, Rfl: 2 ?  atorvastatin (LIPITOR) 10 MG tablet, Take 1 tablet (10 mg total) by mouth daily., Disp: 30 tablet, Rfl: 11 ?  buPROPion (WELLBUTRIN SR) 150 MG 12 hr tablet, Take 150 mg by mouth daily. , Disp: , Rfl:  ?  clopidogrel (PLAVIX) 75 MG tablet, Take 75 mg by mouth daily., Disp: , Rfl:  ?  COMBIVENT RESPIMAT 20-100 MCG/ACT AERS respimat, Inhale 1-2 puffs into the lungs 4 (four) times daily., Disp: 4 g, Rfl: 0 ?  escitalopram (LEXAPRO) 10 MG tablet, Take 10 mg by mouth daily  in the afternoon., Disp: , Rfl:  ?  feeding supplement (ENSURE ENLIVE / ENSURE PLUS) LIQD, Take 237 mLs by mouth at bedtime., Disp: 7110 mL, Rfl: 0 ?  Fluticasone-Salmeterol (ADVAIR) 250-50 MCG/DOSE AEPB, Inhale 1 puff into the lungs 2 (two) times daily., Disp: 60 each, Rfl: 0 ?  levothyroxine (SYNTHROID, LEVOTHROID) 88 MCG tablet, Take 88 mcg by mouth daily before breakfast. , Disp: , Rfl:  ?  montelukast (SINGULAIR) 10 MG tablet, Take 10 mg by mouth daily., Disp: , Rfl:  ?  nicotine (NICODERM CQ - DOSED IN MG/24 HOURS) 21 mg/24hr patch, 1 patch chest wall daily (okay to substitute generic), Disp: 28 patch, Rfl: 0 ?  nutrition supplement, JUVEN, (JUVEN) PACK, Take 1 packet by mouth 2 (two) times daily between meals., Disp: 60 packet, Rfl: 0 ?  oxyCODONE (OXY IR/ROXICODONE) 5 MG immediate release tablet, Take 1 tablet (5 mg total)  by mouth 2 (two) times daily as needed for severe pain. Must last 30 days, Disp: 60 tablet, Rfl: 0 ?  oxyCODONE 10 MG TABS, Take 1 tablet (10 mg total) by mouth every 6 (six) hours as needed for up to 3 days for moderate pain., Disp: 12 tablet, Rfl: 0 ?  polyethylene glycol (MIRALAX / GLYCOLAX) 17 g packet, Take 17 g by mouth daily., Disp: 30 each, Rfl: 0 ?  predniSONE (DELTASONE) 10 MG tablet, 3 tabs po day 1; 2 tabs po day 2,3; 1 tab po day 4,5, 1/2 tab po day6,7, Disp: 10 tablet, Rfl: 0 ?  senna-docusate (SENOKOT-S) 8.6-50 MG tablet, Take 2 tablets by mouth at bedtime., Disp: 60 tablet, Rfl: 0 ?  traZODone (DESYREL) 150 MG tablet, Take 300 mg by mouth at bedtime as needed., Disp: , Rfl:  ?  Vitamin D, Ergocalciferol, (DRISDOL) 1.25 MG (50000 UNIT) CAPS capsule, Take 1 capsule (50,000 Units total) by mouth every 7 (seven) days., Disp: 4 capsule, Rfl: 0 ? ?Social History  ? ?Tobacco Use  ?Smoking Status Some Days  ? Packs/day: 0.00  ? Types: Cigarettes  ?Smokeless Tobacco Never  ?Tobacco Comments  ? pt states 2 cigs per month   ? ? ?No Known Allergies ?Objective:  ?There were no vitals filed for this visit. ?There is no height or weight on file to calculate BMI. ?Constitutional Well developed. ?Well nourished.  ?Vascular Dorsalis pedis pulses non palpable bilaterally. ?Posterior tibial pulses non palpable bilaterally. ?Capillary refill diminished to all digits.  ?No cyanosis or clubbing noted. ?Pedal hair not present  ?Neurologic Normal speech. ?Oriented to person, place, and time. ?Epicritic sensation to light touch grossly present bilaterally.  ?Dermatologic Nails well groomed and normal in appearance. ?No open wounds. ?No skin lesions.  ?Orthopedic: Dependent rubor noted to the forefoot.  No signs of ulceration or breakdown of the skin noted.  No gangrenous changes noted.  ? ?Radiographs: None ?Assessment:  ? ?1. PAD (peripheral artery disease) ([place])   ?2. Dependent rubor   ?3. Erythema   ? ?Plan:  ?Patient was  evaluated and treated and all questions answered. ? ?Right dependent rubor/pain with underlying history o severe PAD ?-All questions and concerns were discussed with the patient in extensive detail ?-I discussed with the patient that given the amount of pain that she is having she will benefit from pain medication however we will have to strictly monitor the pain that she is experiencing.  Her pain is more vascular in nature which may not respond to opioids.  She has received opioids in the past which  has helped with this pain. ?-Oxycodone was sent to her pharmacy ?-In case if there is an infectious process patient will benefit from doxycycline.  Doxycycline was sent to the pharmacy. ?-She will benefit from seeing Dr. Lucky Cowboy for further evaluation management for the right side. ? ?No follow-ups on file.  ?

## 2022-03-07 ENCOUNTER — Ambulatory Visit: Payer: Medicare HMO | Admitting: Cardiology

## 2022-03-07 ENCOUNTER — Other Ambulatory Visit: Payer: Self-pay | Admitting: Podiatry

## 2022-03-07 DIAGNOSIS — I999 Unspecified disorder of circulatory system: Secondary | ICD-10-CM

## 2022-03-14 ENCOUNTER — Ambulatory Visit: Payer: Medicare HMO | Admitting: Podiatry

## 2022-03-14 DIAGNOSIS — I739 Peripheral vascular disease, unspecified: Secondary | ICD-10-CM

## 2022-03-14 DIAGNOSIS — L539 Erythematous condition, unspecified: Secondary | ICD-10-CM

## 2022-03-14 MED ORDER — OXYCODONE HCL 5 MG PO TABS: 5.0000 mg | ORAL_TABLET | ORAL | 0 refills | Status: DC | PRN

## 2022-03-14 MED ORDER — DOXYCYCLINE HYCLATE 100 MG PO TABS: 100.0000 mg | ORAL_TABLET | Freq: Two times a day (BID) | ORAL | 0 refills | Status: DC

## 2022-03-21 ENCOUNTER — Telehealth (INDEPENDENT_AMBULATORY_CARE_PROVIDER_SITE_OTHER): Payer: Self-pay | Admitting: Vascular Surgery

## 2022-03-21 ENCOUNTER — Other Ambulatory Visit: Payer: Self-pay | Admitting: Podiatry

## 2022-03-21 DIAGNOSIS — I739 Peripheral vascular disease, unspecified: Secondary | ICD-10-CM

## 2022-03-21 NOTE — Progress Notes (Signed)
?Subjective:  ?Patient ID: [name], female    DOB: 11/05/1946,  MRN: [id number] ? ?Chief Complaint  ?Patient presents with  ? Foot Pain  ? ? ?76 y.o. female presents with the above complaint.  Patient presents with right foot pain that has been going on for quite some time.  She is well-known to [facility] but would like to switch to a doctor and get a second opinion.  She has had multiple toe amputations including second third and fourth digit done previously.  She wanted to evaluate the redness is present.  She does have a severe vascular history.  She was last treated by Dr. Lucky Cowboy which she did an angiogram on 10/20/2021.  She has a history of multiple stent stents on the right side.  However her flow was still occluded past the posterior tibial into the foot. ? ? ?Review of Systems: Negative except as noted in the HPI. Denies N/V/F/Ch. ? ?Past Medical History:  ?Diagnosis Date  ? Anxiety   ? CAD (coronary artery disease)   ? Carpal tunnel syndrome   ? Cataract   ? Complication of anesthesia   ? has woken up at times  ? COPD (chronic obstructive pulmonary disease) ([place])   ? CRP elevated 09/14/2015  ? Depression   ? Difficult intubation   ? Dyslipidemia   ? Dyspnea   ? DOE  ? Dysrhythmia   ? hx palpatations  ? Elevated sedimentation rate 09/14/2015  ? Esophageal spasm   ? Gastrointestinal parasites   ? GERD (gastroesophageal reflux disease)   ? HCAP (healthcare-associated pneumonia) 10/22/2018  ? Hiatal hernia   ? History of peptic ulcer disease   ? Hypertension   ? Hyperthyroidism   ? Hypothyroidism   ? Low magnesium levels 09/14/2015  ? Pelvic fracture ([place]) 2008  ? fall from riding a horse  ? Peripheral vascular disease ([place])   ? Reflux   ? Rotator cuff injury   ? Sepsis ([place]) 10/22/2018  ? Stenosis, spinal, lumbar   ? ? ?Current Outpatient Medications:  ?  doxycycline (VIBRA-TABS) 100 MG tablet, Take 1 tablet (100 mg total) by mouth 2 (two) times daily., Disp: 20 tablet, Rfl: 0 ?  oxyCODONE (ROXICODONE)  5 MG immediate release tablet, Take 1 tablet (5 mg total) by mouth every 4 (four) hours as needed for severe pain., Disp: 30 tablet, Rfl: 0 ?  albuterol (PROVENTIL) (2.5 MG/3ML) 0.083% nebulizer solution, Take 3 mLs (2.5 mg total) by nebulization every 4 (four) hours as needed for wheezing or shortness of breath., Disp: 75 mL, Rfl: 2 ?  ALPRAZolam (XANAX) 1 MG tablet, Take 1 mg by mouth 3 (three) times daily as needed., Disp: , Rfl:  ?  amLODipine (NORVASC) 5 MG tablet, Take 5 mg by mouth daily., Disp: , Rfl:  ?  aspirin EC 81 MG tablet, Take 1 tablet (81 mg total) by mouth daily., Disp: 150 tablet, Rfl: 2 ?  atorvastatin (LIPITOR) 10 MG tablet, Take 1 tablet (10 mg total) by mouth daily., Disp: 30 tablet, Rfl: 11 ?  buPROPion (WELLBUTRIN SR) 150 MG 12 hr tablet, Take 150 mg by mouth daily. , Disp: , Rfl:  ?  clopidogrel (PLAVIX) 75 MG tablet, Take 75 mg by mouth daily., Disp: , Rfl:  ?  COMBIVENT RESPIMAT 20-100 MCG/ACT AERS respimat, Inhale 1-2 puffs into the lungs 4 (four) times daily., Disp: 4 g, Rfl: 0 ?  escitalopram (LEXAPRO) 10 MG tablet, Take 10 mg by mouth daily in the  afternoon., Disp: , Rfl:  ?  feeding supplement (ENSURE ENLIVE / ENSURE PLUS) LIQD, Take 237 mLs by mouth at bedtime., Disp: 7110 mL, Rfl: 0 ?  Fluticasone-Salmeterol (ADVAIR) 250-50 MCG/DOSE AEPB, Inhale 1 puff into the lungs 2 (two) times daily., Disp: 60 each, Rfl: 0 ?  levothyroxine (SYNTHROID, LEVOTHROID) 88 MCG tablet, Take 88 mcg by mouth daily before breakfast. , Disp: , Rfl:  ?  montelukast (SINGULAIR) 10 MG tablet, Take 10 mg by mouth daily., Disp: , Rfl:  ?  nicotine (NICODERM CQ - DOSED IN MG/24 HOURS) 21 mg/24hr patch, 1 patch chest wall daily (okay to substitute generic), Disp: 28 patch, Rfl: 0 ?  nutrition supplement, JUVEN, (JUVEN) PACK, Take 1 packet by mouth 2 (two) times daily between meals., Disp: 60 packet, Rfl: 0 ?  oxyCODONE (OXY IR/ROXICODONE) 5 MG immediate release tablet, Take 1 tablet (5 mg total) by mouth 2 (two)  times daily as needed for severe pain. Must last 30 days, Disp: 60 tablet, Rfl: 0 ?  oxyCODONE 10 MG TABS, Take 1 tablet (10 mg total) by mouth every 6 (six) hours as needed for up to 3 days for moderate pain., Disp: 12 tablet, Rfl: 0 ?  polyethylene glycol (MIRALAX / GLYCOLAX) 17 g packet, Take 17 g by mouth daily., Disp: 30 each, Rfl: 0 ?  predniSONE (DELTASONE) 10 MG tablet, 3 tabs po day 1; 2 tabs po day 2,3; 1 tab po day 4,5, 1/2 tab po day6,7, Disp: 10 tablet, Rfl: 0 ?  senna-docusate (SENOKOT-S) 8.6-50 MG tablet, Take 2 tablets by mouth at bedtime., Disp: 60 tablet, Rfl: 0 ?  traZODone (DESYREL) 150 MG tablet, Take 300 mg by mouth at bedtime as needed., Disp: , Rfl:  ?  Vitamin D, Ergocalciferol, (DRISDOL) 1.25 MG (50000 UNIT) CAPS capsule, Take 1 capsule (50,000 Units total) by mouth every 7 (seven) days., Disp: 4 capsule, Rfl: 0 ? ?Social History  ? ?Tobacco Use  ?Smoking Status Some Days  ? Packs/day: 0.00  ? Types: Cigarettes  ?Smokeless Tobacco Never  ?Tobacco Comments  ? pt states 2 cigs per month   ? ? ?No Known Allergies ?Objective:  ?There were no vitals filed for this visit. ?There is no height or weight on file to calculate BMI. ?Constitutional Well developed. ?Well nourished.  ?Vascular Dorsalis pedis pulses non palpable bilaterally. ?Posterior tibial pulses non palpable bilaterally. ?Capillary refill diminished to all digits.  ?No cyanosis or clubbing noted. ?Pedal hair not present  ?Neurologic Normal speech. ?Oriented to person, place, and time. ?Epicritic sensation to light touch grossly present bilaterally.  ?Dermatologic Nails well groomed and normal in appearance. ?No open wounds. ?No skin lesions.  ?Orthopedic: Dependent rubor noted to the forefoot.  No signs of ulceration or breakdown of the skin noted.  No gangrenous changes noted.  ? ?Radiographs: None ?Assessment:  ? ?1. PAD (peripheral artery disease) ([place])   ?2. Dependent rubor   ?3. Erythema   ? ? ?Plan:  ?Patient was evaluated and  treated and all questions answered. ? ?Right dependent rubor/pain with underlying history o severe PAD ?-All questions and concerns were discussed with the patient in extensive detail ?-I discussed with the patient that given the amount of pain that she is having she will benefit from pain medication however we will have to strictly monitor the pain that she is experiencing.  Her pain is more vascular in nature which may not respond to opioids.  She has received opioids in the past which has  helped with this pain. ?-Oxycodone was sent to her pharmacy for pain control ?-Continue taking doxycycline for skin and soft tissue prophylaxis ?-I will reach out to Dr. Lucky Cowboy for further evaluation and management of the right side. ?No follow-ups on file.  ?

## 2022-03-21 NOTE — Telephone Encounter (Signed)
Returned call to patient.

## 2022-03-27 ENCOUNTER — Telehealth (INDEPENDENT_AMBULATORY_CARE_PROVIDER_SITE_OTHER): Payer: Self-pay | Admitting: Vascular Surgery

## 2022-03-27 NOTE — Telephone Encounter (Signed)
Made care giver aware of appt date and time. ?

## 2022-03-27 NOTE — Telephone Encounter (Signed)
Patient is having swelling and turning red and her toes are turning brown.  She is having a lot of pain and wants an appointment with Dr. Lucky Cowboy. Please call her caregiver at [phone]. ?

## 2022-03-27 NOTE — Telephone Encounter (Signed)
Tried to call pt no way to leave VM  ? ?

## 2022-03-27 NOTE — Telephone Encounter (Signed)
Pt has been scheduled for 03/28/2022 at 9:30 for ABIs and Dr Lucky Cowboy following.   ?

## 2022-03-27 NOTE — Telephone Encounter (Signed)
After speaking with Eulogio Ditch NP, she advised the pt be seen with ABIs by her or Dr Lucky Cowboy.   I called the caregiver and she said the pt is addiment about seeing Dr Lucky Cowboy.  I let her know it may be a a week or more before Dr Lucky Cowboy has an appt.  However I will speak with ultrasound tech to see where we can work her in sooner if that is a possibility and I will call her back with an appt time.  ?

## 2022-03-28 ENCOUNTER — Encounter (INDEPENDENT_AMBULATORY_CARE_PROVIDER_SITE_OTHER): Payer: Medicare HMO

## 2022-03-28 ENCOUNTER — Ambulatory Visit (INDEPENDENT_AMBULATORY_CARE_PROVIDER_SITE_OTHER): Payer: Medicare HMO | Admitting: Vascular Surgery

## 2022-04-15 ENCOUNTER — Other Ambulatory Visit (INDEPENDENT_AMBULATORY_CARE_PROVIDER_SITE_OTHER): Payer: Self-pay | Admitting: Vascular Surgery

## 2022-04-25 ENCOUNTER — Ambulatory Visit (INDEPENDENT_AMBULATORY_CARE_PROVIDER_SITE_OTHER): Payer: Medicare HMO | Admitting: Vascular Surgery

## 2022-04-25 ENCOUNTER — Ambulatory Visit: Payer: Medicare HMO | Admitting: Podiatry

## 2022-04-25 ENCOUNTER — Encounter (INDEPENDENT_AMBULATORY_CARE_PROVIDER_SITE_OTHER): Payer: Medicare HMO

## 2022-04-28 ENCOUNTER — Telehealth: Payer: Self-pay | Admitting: Podiatry

## 2022-04-28 NOTE — Telephone Encounter (Signed)
Pt was wanting a rx for antibiotic and something for pain she stated she has toes going black and is painful. Please advise

## 2022-04-30 NOTE — Progress Notes (Deleted)
No show

## 2022-05-01 ENCOUNTER — Encounter: Payer: Self-pay | Admitting: Cardiovascular Disease

## 2022-05-01 ENCOUNTER — Ambulatory Visit: Payer: Medicare HMO | Admitting: Cardiovascular Disease

## 2022-05-01 DIAGNOSIS — J441 Chronic obstructive pulmonary disease with (acute) exacerbation: Secondary | ICD-10-CM

## 2022-05-01 DIAGNOSIS — I70261 Atherosclerosis of native arteries of extremities with gangrene, right leg: Secondary | ICD-10-CM

## 2022-05-01 DIAGNOSIS — I739 Peripheral vascular disease, unspecified: Secondary | ICD-10-CM

## 2022-05-01 DIAGNOSIS — E782 Mixed hyperlipidemia: Secondary | ICD-10-CM

## 2022-05-01 MED ORDER — OXYCODONE HCL 5 MG PO TABS: 5.0000 mg | ORAL_TABLET | ORAL | 0 refills | Status: DC | PRN

## 2022-05-01 MED ORDER — DOXYCYCLINE HYCLATE 100 MG PO TABS: 100.0000 mg | ORAL_TABLET | Freq: Two times a day (BID) | ORAL | 0 refills | Status: DC

## 2022-05-01 NOTE — Telephone Encounter (Signed)
Pharmacy([facility] called and said that patient had transferred out of their care, can only fill the prescription (oxycodone)once, please update your file.  Patient has been notified that prescription is there ready for pick up.

## 2022-05-02 ENCOUNTER — Ambulatory Visit: Payer: Medicare HMO | Admitting: Podiatry

## 2022-05-07 ENCOUNTER — Emergency Department: Payer: Medicare HMO

## 2022-05-07 ENCOUNTER — Other Ambulatory Visit: Payer: Self-pay

## 2022-05-07 ENCOUNTER — Emergency Department
Admission: EM | Admit: 2022-05-07 | Discharge: 2022-05-07 | Disposition: A | Discharge status: Home / Self Care | Payer: Medicare HMO | Attending: Emergency Medicine | Admitting: Emergency Medicine

## 2022-05-07 DIAGNOSIS — J449 Chronic obstructive pulmonary disease, unspecified: Secondary | ICD-10-CM | POA: Insufficient documentation

## 2022-05-07 DIAGNOSIS — N3000 Acute cystitis without hematuria: Secondary | ICD-10-CM | POA: Insufficient documentation

## 2022-05-07 DIAGNOSIS — L03115 Cellulitis of right lower limb: Secondary | ICD-10-CM | POA: Diagnosis not present

## 2022-05-07 DIAGNOSIS — R4182 Altered mental status, unspecified: Secondary | ICD-10-CM | POA: Diagnosis not present

## 2022-05-07 DIAGNOSIS — S99911A Unspecified injury of right ankle, initial encounter: Secondary | ICD-10-CM | POA: Diagnosis present

## 2022-05-07 DIAGNOSIS — W1839XA Other fall on same level, initial encounter: Secondary | ICD-10-CM | POA: Insufficient documentation

## 2022-05-07 DIAGNOSIS — R079 Chest pain, unspecified: Secondary | ICD-10-CM | POA: Diagnosis not present

## 2022-05-07 DIAGNOSIS — S91011A Laceration without foreign body, right ankle, initial encounter: Secondary | ICD-10-CM | POA: Insufficient documentation

## 2022-05-07 DIAGNOSIS — R443 Hallucinations, unspecified: Secondary | ICD-10-CM | POA: Diagnosis present

## 2022-05-07 LAB — COMPREHENSIVE METABOLIC PANEL
ALT: 29 U/L (ref 0–44)
AST: 28 U/L (ref 15–41)
Albumin: 3.3 g/dL — ABNORMAL LOW (ref 3.5–5.0)
Alkaline Phosphatase: 88 U/L (ref 38–126)
Anion gap: 8 (ref 5–15)
BUN: 7 mg/dL — ABNORMAL LOW (ref 8–23)
CO2: 30 mmol/L (ref 22–32)
Calcium: 9.2 mg/dL (ref 8.9–10.3)
Chloride: 106 mmol/L (ref 98–111)
Creatinine, Ser: 0.76 mg/dL (ref 0.44–1.00)
GFR, Estimated: >60 mL/min (ref >60)
Glucose, Bld: 107 mg/dL — ABNORMAL HIGH (ref 70–99)
Potassium: 3.3 mmol/L — ABNORMAL LOW (ref 3.5–5.1)
Sodium: 144 mmol/L (ref 135–145)
Total Bilirubin: 0.6 mg/dL (ref 0.3–1.2)
Total Protein: 7.1 g/dL (ref 6.5–8.1)

## 2022-05-07 LAB — URINALYSIS, ROUTINE W REFLEX MICROSCOPIC
Bilirubin Urine: NEGATIVE
Glucose, UA: NEGATIVE mg/dL
Hgb urine dipstick: NEGATIVE
Ketones, ur: NEGATIVE mg/dL
Nitrite: NEGATIVE
Protein, ur: NEGATIVE mg/dL
Specific Gravity, Urine: 1.014 (ref 1.005–1.030)
pH: 7 (ref 5.0–8.0)

## 2022-05-07 LAB — URINE DRUG SCREEN, QUALITATIVE (ARMC ONLY)
Amphetamines, Ur Screen: NOT DETECTED
Barbiturates, Ur Screen: NOT DETECTED
Benzodiazepine, Ur Scrn: POSITIVE — AB
Cannabinoid 50 Ng, Ur ~~LOC~~: NOT DETECTED
Cocaine Metabolite,Ur ~~LOC~~: NOT DETECTED
MDMA (Ecstasy)Ur Screen: NOT DETECTED
Methadone Scn, Ur: NOT DETECTED
Opiate, Ur Screen: NOT DETECTED
Phencyclidine (PCP) Ur S: NOT DETECTED
Tricyclic, Ur Screen: NOT DETECTED

## 2022-05-07 LAB — CBC WITH DIFFERENTIAL/PLATELET
Abs Immature Granulocytes: 0.03 10*3/uL (ref 0.00–0.07)
Basophils Absolute: 0 10*3/uL (ref 0.0–0.1)
Basophils Relative: 0 %
Eosinophils Absolute: 0.1 10*3/uL (ref 0.0–0.5)
Eosinophils Relative: 2 %
HCT: 36.1 % (ref 36.0–46.0)
Hemoglobin: 11.3 g/dL — ABNORMAL LOW (ref 12.0–15.0)
Immature Granulocytes: 0 %
Lymphocytes Relative: 20 %
Lymphs Abs: 1.5 10*3/uL (ref 0.7–4.0)
MCH: 28.2 pg (ref 26.0–34.0)
MCHC: 31.3 g/dL (ref 30.0–36.0)
MCV: 90 fL (ref 80.0–100.0)
Monocytes Absolute: 0.6 10*3/uL (ref 0.1–1.0)
Monocytes Relative: 8 %
Neutro Abs: 5.2 10*3/uL (ref 1.7–7.7)
Neutrophils Relative %: 70 %
Platelets: 335 10*3/uL (ref 150–400)
RBC: 4.01 MIL/uL (ref 3.87–5.11)
RDW: 13.7 % (ref 11.5–15.5)
WBC: 7.5 10*3/uL (ref 4.0–10.5)
nRBC: 0 % (ref 0.0–0.2)

## 2022-05-07 LAB — LACTIC ACID, PLASMA: Lactic Acid, Venous: 1 mmol/L (ref 0.5–1.9)

## 2022-05-07 LAB — PROTIME-INR
INR: 1.1 (ref 0.8–1.2)
Prothrombin Time: 13.9 seconds (ref 11.4–15.2)

## 2022-05-07 LAB — TROPONIN I (HIGH SENSITIVITY): Troponin I (High Sensitivity): 10 ng/L (ref <18)

## 2022-05-07 MED ORDER — IPRATROPIUM-ALBUTEROL 0.5-2.5 (3) MG/3ML IN SOLN
3.0000 mL | Freq: Once | RESPIRATORY_TRACT | Status: AC
  Administered 2022-05-07: 3 mL via RESPIRATORY_TRACT
  Filled 2022-05-07: qty 3

## 2022-05-07 MED ORDER — DOXYCYCLINE HYCLATE 100 MG PO TABS
100.0000 mg | ORAL_TABLET | Freq: Once | ORAL | Status: AC
  Administered 2022-05-07: 100 mg via ORAL
  Filled 2022-05-07: qty 1

## 2022-05-07 MED ORDER — LIDOCAINE-EPINEPHRINE 2 %-1:100000 IJ SOLN
20.0000 mL | Freq: Once | INTRAMUSCULAR | Status: AC
  Administered 2022-05-07: 20 mL via INTRADERMAL
  Filled 2022-05-07: qty 1

## 2022-05-07 MED ORDER — DOXYCYCLINE HYCLATE 50 MG PO CAPS: 100.0000 mg | ORAL_CAPSULE | Freq: Two times a day (BID) | ORAL | 0 refills | Status: AC

## 2022-05-07 NOTE — ED Provider Notes (Signed)
[facility] Provider Note   Event Date/Time   First MD Initiated Contact with Patient 05/07/22 1815     (approximate) History  Fall  HPI [name] is a 76 y.o. female  Location: Right ankle Duration: Just prior to arrival Timing: Since onset Severity: Mild Quality: Laceration Context: Patient had mechanical fall from standing and got her foot caught in some furniture resulting in a medial right ankle laceration Modifying factors: Any palpation of this area worsens the pain Associated Symptoms: Denies ROS: Patient currently denies any vision changes, tinnitus, difficulty speaking, facial droop, sore throat, chest pain, shortness of breath, abdominal pain, nausea/vomiting/diarrhea, dysuria, or weakness/numbness/paresthesias in any extremity   Physical Exam  Triage Vital Signs: ED Triage Vitals  Enc Vitals Group     BP 05/07/22 1747 (!) 167/84     Pulse Rate 05/07/22 1747 (!) 105     Resp 05/07/22 1747 (!) 22     Temp 05/07/22 1750 98.2 F (36.8 C)     Temp Source 05/07/22 1750 Oral     SpO2 05/07/22 1747 92 %     Weight 05/07/22 1748 123 lb (55.8 kg)     Height 05/07/22 1748 '5\' 4"'$  (1.626 m)     Head Circumference --      Peak Flow --      Pain Score 05/07/22 1747 7     Pain Loc --      Pain Edu? --      Excl. in [place]? --    Most recent vital signs: Vitals:   05/07/22 2130 05/07/22 2200  BP: (!) 168/86 (!) 185/97  Pulse: (!) 109 (!) 103  Resp:  19  Temp:    SpO2: 97% 95%   General: Awake, oriented x4. CV:  Good peripheral perfusion.  Resp:  Normal effort.  Abd:  No distention.  Other:  Elderly Caucasian female sitting in recliner in no acute distress.  Medial right ankle shows approximately 2 and 5 cm linear lacerations ED Results / Procedures / Treatments  Labs (all labs ordered are listed, but only abnormal results are displayed) Labs Reviewed  CBC WITH DIFFERENTIAL/PLATELET - Abnormal; Notable for the following components:       Result Value   Hemoglobin 11.3 (*)    All other components within normal limits  COMPREHENSIVE METABOLIC PANEL - Abnormal; Notable for the following components:   Potassium 3.3 (*)    Glucose, Bld 107 (*)    BUN 7 (*)    Albumin 3.3 (*)    All other components within normal limits  URINALYSIS, ROUTINE W REFLEX MICROSCOPIC - Abnormal; Notable for the following components:   Color, Urine YELLOW (*)    APPearance CLEAR (*)    Leukocytes,Ua MODERATE (*)    Bacteria, UA RARE (*)    All other components within normal limits  URINE DRUG SCREEN, QUALITATIVE ([facility] ONLY) - Abnormal; Notable for the following components:   Benzodiazepine, Ur Scrn POSITIVE (*)    All other components within normal limits  PROTIME-INR  LACTIC ACID, PLASMA  LACTIC ACID, PLASMA  TROPONIN I (HIGH SENSITIVITY)  TROPONIN I (HIGH SENSITIVITY)   EKG ED ECG REPORT I, Naaman Plummer, the attending physician, personally viewed and interpreted this ECG. Date: 05/07/2022 EKG Time: 1747 Rate: 107 Rhythm: Tachycardic sinus rhythm QRS Axis: normal Intervals: normal ST/T Wave abnormalities: normal Narrative Interpretation: no evidence of acute ischemia RADIOLOGY ED MD interpretation: CT of the cervical spine interpreted by me does not  show any evidence of acute abnormalities including no acute fracture, malalignment, height loss, or dislocation  CT of the head without contrast interpreted by me shows no evidence of acute abnormalities including no intracerebral hemorrhage, obvious masses, or significant edema  X-ray of the right foot shows no evidence of osteomyelitis or acute fractures or dislocations  2 view chest x-ray interpreted by me shows no evidence of acute abnormalities including no pneumonia, pneumothorax, or widened mediastinum -Agree with radiology assessment Official radiology report(s): CT Cervical Spine Wo Contrast  Result Date: 05/07/2022 CLINICAL DATA:  Neck trauma EXAM: CT CERVICAL SPINE WITHOUT  CONTRAST TECHNIQUE: Multidetector CT imaging of the cervical spine was performed without intravenous contrast. Multiplanar CT image reconstructions were also generated. RADIATION DOSE REDUCTION: This exam was performed according to the departmental dose-optimization program which includes automated exposure control, adjustment of the mA and/or kV according to patient size and/or use of iterative reconstruction technique. COMPARISON:  None Available. FINDINGS: Alignment: Grade 1 anterolisthesis of C7 on T1. Skull base and vertebrae: Anterior cervical fusion hardware identified from C3-C7, the hardware appears grossly intact. No acute fracture identified. Soft tissues and spinal canal: No prevertebral fluid or swelling. No visible canal hematoma. Disc levels: Postsurgical fusion changes. Bilateral facet arthropathy. Degenerative pannus formation around the odontoid. Upper chest: No acute process identified. Mild emphysematous changes of the lungs. Other: None. IMPRESSION: No acute fracture or malalignment identified. Postsurgical and degenerative changes. Electronically Signed   By: Ofilia Neas M.D.   On: 05/07/2022 18:40   CT Head Wo Contrast  Result Date: 05/07/2022 CLINICAL DATA:  Altered mental status EXAM: CT HEAD WITHOUT CONTRAST TECHNIQUE: Contiguous axial images were obtained from the base of the skull through the vertex without intravenous contrast. RADIATION DOSE REDUCTION: This exam was performed according to the departmental dose-optimization program which includes automated exposure control, adjustment of the mA and/or kV according to patient size and/or use of iterative reconstruction technique. COMPARISON:  CT head 08/17/2018 FINDINGS: Brain: No acute intracranial hemorrhage, mass effect, or herniation. No extra-axial fluid collections. No evidence of acute territorial infarct. No hydrocephalus. Mild cortical volume loss. Extensive patchy hypodensities throughout the periventricular and  subcortical white matter, likely secondary to chronic microvascular ischemic changes. Vascular: Calcified plaques in the carotid siphons. Skull: Normal. Negative for fracture or focal lesion. Sinuses/Orbits: No acute finding. Other: None. IMPRESSION: Chronic changes with no acute intracranial process identified. Electronically Signed   By: Ofilia Neas M.D.   On: 05/07/2022 18:37   DG Foot Complete Right  Result Date: 05/07/2022 CLINICAL DATA:  necrosis of 5th toe and foot erythema EXAM: RIGHT FOOT COMPLETE - 3+ VIEW COMPARISON:  X-ray right foot 10/18/2021 FINDINGS: Prior second and third toe phalangeal amputation. Chronic erosion/deformity of the fifth digit proximal head. No cortical erosion or destruction to suggest acute osteomyelitis. There is no evidence of fracture or dislocation. There is no evidence of arthropathy or other focal bone abnormality. Soft tissues are unremarkable. IMPRESSION: 1. No radiographic findings to suggest osteomyelitis. Consider MRI further evaluation if high clinical suspicion (with intravenous contrast if GFR greater than 30). 2.  No acute displaced fracture or dislocation. Electronically Signed   By: Iven Finn M.D.   On: 05/07/2022 18:15   DG Chest 2 View  Result Date: 05/07/2022 CLINICAL DATA:  Acute chest pain. EXAM: CHEST - 2 VIEW COMPARISON:  11/24/2021 FINDINGS: The cardiomediastinal silhouette is unremarkable. There is no evidence of focal airspace disease, pulmonary edema, suspicious pulmonary nodule/mass, pleural effusion, or pneumothorax. No  acute bony abnormalities are identified. Cervical spinal fusion hardware again noted. Mild compression of several thoracic vertebra again noted. IMPRESSION: No active cardiopulmonary disease. Electronically Signed   By: Margarette Canada M.D.   On: 05/07/2022 18:14   PROCEDURES: Critical Care performed: No ..Laceration Repair  Date/Time: 05/07/2022 11:10 PM  Performed by: Naaman Plummer, MD Authorized by: Naaman Plummer, MD   Consent:    Consent obtained:  Verbal   Consent given by:  Patient   Risks, benefits, and alternatives were discussed: yes     Risks discussed:  Infection, pain, retained foreign body, poor cosmetic result and poor wound healing   Alternatives discussed:  No treatment, delayed treatment, observation and referral Universal protocol:    Immediately prior to procedure, a time out was called: yes     Patient identity confirmed:  Verbally with patient Anesthesia:    Anesthesia method:  Local infiltration   Local anesthetic:  Lidocaine 2% WITH epi Laceration details:    Location:  Leg   Leg location:  R lower leg   Length (cm):  5   Depth (mm):  10 Pre-procedure details:    Preparation:  Patient was prepped and draped in usual sterile fashion Exploration:    Limited defect created (wound extended): no     Hemostasis achieved with:  Direct pressure   Imaging outcome: foreign body not noted     Wound exploration: wound explored through full range of motion and entire depth of wound visualized     Contaminated: no   Treatment:    Area cleansed with:  Chlorhexidine   Amount of cleaning:  Standard   Irrigation solution:  Sterile saline   Irrigation volume:  400   Irrigation method:  Pressure wash and syringe   Visualized foreign bodies/material removed: no     Debridement:  None   Undermining:  None   Scar revision: no   Skin repair:    Repair method:  Sutures   Suture size:  4-0   Suture material:  Chromic gut   Suture technique:  Running locked   Number of sutures:  8 Approximation:    Approximation:  Close Repair type:    Repair type:  Simple Post-procedure details:    Dressing:  Antibiotic ointment and non-adherent dressing   Procedure completion:  Tolerated well, no immediate complications .Marland KitchenLaceration Repair  Date/Time: 05/07/2022 11:12 PM  Performed by: Naaman Plummer, MD Authorized by: Naaman Plummer, MD   Consent:    Consent obtained:  Verbal    Consent given by:  Patient   Risks, benefits, and alternatives were discussed: yes     Risks discussed:  Infection, pain, poor cosmetic result and poor wound healing   Alternatives discussed:  No treatment, delayed treatment, observation and referral Universal protocol:    Immediately prior to procedure, a time out was called: yes     Patient identity confirmed:  Verbally with patient Anesthesia:    Anesthesia method:  Local infiltration   Local anesthetic:  Lidocaine 2% w/o epi Laceration details:    Location:  Leg   Leg location:  R lower leg   Length (cm):  3   Depth (mm):  10 Pre-procedure details:    Preparation:  Patient was prepped and draped in usual sterile fashion Exploration:    Limited defect created (wound extended): no     Hemostasis achieved with:  Direct pressure   Imaging outcome: foreign body not noted     Wound  exploration: wound explored through full range of motion and entire depth of wound visualized     Contaminated: no   Treatment:    Area cleansed with:  Povidone-iodine and saline   Amount of cleaning:  Standard   Irrigation solution:  Sterile saline   Irrigation volume:  200   Irrigation method:  Syringe   Visualized foreign bodies/material removed: no     Debridement:  None   Undermining:  None   Scar revision: no   Skin repair:    Repair method:  Sutures   Suture size:  4-0   Suture material:  Chromic gut   Suture technique:  Running locked   Number of sutures:  5 Approximation:    Approximation:  Close Repair type:    Repair type:  Simple Post-procedure details:    Dressing:  Non-adherent dressing and antibiotic ointment   Procedure completion:  Tolerated well, no immediate complications .1-3 Lead EKG Interpretation  Performed by: Naaman Plummer, MD Authorized by: Naaman Plummer, MD     Interpretation: normal     ECG rate:  97   ECG rate assessment: normal     Rhythm: sinus rhythm     Ectopy: none     Conduction: normal     MEDICATIONS ORDERED IN ED: Medications  lidocaine-EPINEPHrine (XYLOCAINE W/EPI) 2 %-1:100000 (with pres) injection 20 mL (20 mLs Intradermal Given by Other 05/07/22 1942)  ipratropium-albuterol (DUONEB) 0.5-2.5 (3) MG/3ML nebulizer solution 3 mL (3 mLs Nebulization Given 05/07/22 1940)  doxycycline (VIBRA-TABS) tablet 100 mg (100 mg Oral Given 05/07/22 2235)   IMPRESSION / MDM / ASSESSMENT AND PLAN / ED COURSE  I reviewed the triage vital signs and the nursing notes.                             The patient is on the cardiac monitor to evaluate for evidence of arrhythmia and/or significant heart rate changes. Patient's presentation is most consistent with acute presentation with potential threat to life or bodily function. Presenting after a fall that occurred just prior to arrival, resulting in injury to the right ankle. The mechanism of injury was a mechanical ground level fall without syncope or near-syncope. The current level of pain is moderate. There was no loss of consciousness, confusion, seizure, or memory impairment. There is a laceration associated with the injury. Denies neck pain. The patient does not take blood thinner medications. Denies vomiting, numbness/weakness, fever  Dispo: Discharge with PCP follow-up       FINAL CLINICAL IMPRESSION(S) / ED DIAGNOSES   Final diagnoses:  Cellulitis of right lower extremity  Laceration of right ankle, initial encounter  Acute cystitis without hematuria   Rx / DC Orders   ED Discharge Orders          Ordered    doxycycline (VIBRAMYCIN) 50 MG capsule  2 times daily        05/07/22 2212           Note:  This document was prepared using Dragon voice recognition software and may include unintentional dictation errors.   Naaman Plummer, MD 05/07/22 [phone]
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
## 2022-05-07 NOTE — ED Provider Triage Note (Signed)
Emergency Medicine Provider Triage Evaluation Note  [name] , a 76 y.o. female  was evaluated in triage.  Pt complains of laceration to the right leg after her walker turned over and caught it. EMS state caregivers advised that she has taken more oxycodone that prescribed and she has been hallucinating. Also, right 5th toe is necrotic and foot is swollen and red.   Physical Exam  There were no vitals taken for this visit. Gen:   Awake, no distress   Resp:  Normal effort  MSK:   Moves extremities without difficulty  Other:    Medical Decision Making  Medically screening exam initiated at 5:45 PM.  Appropriate orders placed.  [name] was informed that the remainder of the evaluation will be completed by another provider, this initial triage assessment does not replace that evaluation, and the importance of remaining in the ED until their evaluation is complete.    Victorino Dike, FNP 05/07/22 1759
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
## 2022-05-09 ENCOUNTER — Ambulatory Visit: Payer: Medicare HMO | Admitting: Podiatry

## 2022-06-01 ENCOUNTER — Ambulatory Visit: Payer: Medicare HMO | Admitting: Podiatry

## 2022-06-08 ENCOUNTER — Ambulatory Visit: Payer: Medicare HMO | Admitting: Podiatry

## 2022-06-09 ENCOUNTER — Other Ambulatory Visit (INDEPENDENT_AMBULATORY_CARE_PROVIDER_SITE_OTHER): Payer: Self-pay | Admitting: Vascular Surgery

## 2022-06-09 DIAGNOSIS — I70261 Atherosclerosis of native arteries of extremities with gangrene, right leg: Secondary | ICD-10-CM

## 2022-06-09 DIAGNOSIS — Z95820 Peripheral vascular angioplasty status with implants and grafts: Secondary | ICD-10-CM

## 2022-06-13 ENCOUNTER — Ambulatory Visit (INDEPENDENT_AMBULATORY_CARE_PROVIDER_SITE_OTHER): Payer: Medicare HMO | Admitting: Vascular Surgery

## 2022-06-13 ENCOUNTER — Encounter (INDEPENDENT_AMBULATORY_CARE_PROVIDER_SITE_OTHER): Payer: Medicare HMO

## 2022-06-15 ENCOUNTER — Ambulatory Visit: Payer: Medicare HMO | Admitting: Podiatry

## 2022-06-22 ENCOUNTER — Ambulatory Visit: Payer: Medicare HMO | Admitting: Podiatry

## 2022-06-29 ENCOUNTER — Emergency Department: Payer: Medicare HMO

## 2022-06-29 ENCOUNTER — Inpatient Hospital Stay
Admission: EM | Admit: 2022-06-29 | Discharge: 2022-07-08 | DRG: 853 | Disposition: A | Discharge status: Home Health | Payer: Medicare HMO | Attending: Internal Medicine | Admitting: Internal Medicine

## 2022-06-29 ENCOUNTER — Other Ambulatory Visit: Payer: Self-pay

## 2022-06-29 DIAGNOSIS — F32A Depression, unspecified: Secondary | ICD-10-CM | POA: Diagnosis present

## 2022-06-29 DIAGNOSIS — Z9049 Acquired absence of other specified parts of digestive tract: Secondary | ICD-10-CM

## 2022-06-29 DIAGNOSIS — I10 Essential (primary) hypertension: Secondary | ICD-10-CM | POA: Diagnosis not present

## 2022-06-29 DIAGNOSIS — E785 Hyperlipidemia, unspecified: Secondary | ICD-10-CM | POA: Diagnosis present

## 2022-06-29 DIAGNOSIS — M879 Osteonecrosis, unspecified: Secondary | ICD-10-CM | POA: Diagnosis present

## 2022-06-29 DIAGNOSIS — F1320 Sedative, hypnotic or anxiolytic dependence, uncomplicated: Secondary | ICD-10-CM | POA: Diagnosis present

## 2022-06-29 DIAGNOSIS — I2510 Atherosclerotic heart disease of native coronary artery without angina pectoris: Secondary | ICD-10-CM | POA: Diagnosis present

## 2022-06-29 DIAGNOSIS — I70261 Atherosclerosis of native arteries of extremities with gangrene, right leg: Secondary | ICD-10-CM | POA: Diagnosis present

## 2022-06-29 DIAGNOSIS — Z9841 Cataract extraction status, right eye: Secondary | ICD-10-CM

## 2022-06-29 DIAGNOSIS — F419 Anxiety disorder, unspecified: Secondary | ICD-10-CM | POA: Diagnosis not present

## 2022-06-29 DIAGNOSIS — M86171 Other acute osteomyelitis, right ankle and foot: Secondary | ICD-10-CM | POA: Diagnosis present

## 2022-06-29 DIAGNOSIS — R4182 Altered mental status, unspecified: Principal | ICD-10-CM

## 2022-06-29 DIAGNOSIS — Z961 Presence of intraocular lens: Secondary | ICD-10-CM | POA: Diagnosis present

## 2022-06-29 DIAGNOSIS — Z7951 Long term (current) use of inhaled steroids: Secondary | ICD-10-CM

## 2022-06-29 DIAGNOSIS — M869 Osteomyelitis, unspecified: Secondary | ICD-10-CM | POA: Diagnosis present

## 2022-06-29 DIAGNOSIS — E876 Hypokalemia: Secondary | ICD-10-CM | POA: Diagnosis not present

## 2022-06-29 DIAGNOSIS — Z79899 Other long term (current) drug therapy: Secondary | ICD-10-CM

## 2022-06-29 DIAGNOSIS — A419 Sepsis, unspecified organism: Secondary | ICD-10-CM | POA: Diagnosis present

## 2022-06-29 DIAGNOSIS — Z7902 Long term (current) use of antithrombotics/antiplatelets: Secondary | ICD-10-CM | POA: Diagnosis not present

## 2022-06-29 DIAGNOSIS — L97819 Non-pressure chronic ulcer of other part of right lower leg with unspecified severity: Secondary | ICD-10-CM | POA: Diagnosis present

## 2022-06-29 DIAGNOSIS — R519 Headache, unspecified: Secondary | ICD-10-CM | POA: Diagnosis present

## 2022-06-29 DIAGNOSIS — K219 Gastro-esophageal reflux disease without esophagitis: Secondary | ICD-10-CM | POA: Diagnosis present

## 2022-06-29 DIAGNOSIS — J9621 Acute and chronic respiratory failure with hypoxia: Secondary | ICD-10-CM | POA: Diagnosis not present

## 2022-06-29 DIAGNOSIS — Z981 Arthrodesis status: Secondary | ICD-10-CM

## 2022-06-29 DIAGNOSIS — T380X5A Adverse effect of glucocorticoids and synthetic analogues, initial encounter: Secondary | ICD-10-CM | POA: Diagnosis not present

## 2022-06-29 DIAGNOSIS — F17210 Nicotine dependence, cigarettes, uncomplicated: Secondary | ICD-10-CM | POA: Diagnosis present

## 2022-06-29 DIAGNOSIS — R000 Tachycardia, unspecified: Secondary | ICD-10-CM | POA: Diagnosis present

## 2022-06-29 DIAGNOSIS — H9190 Unspecified hearing loss, unspecified ear: Secondary | ICD-10-CM | POA: Diagnosis present

## 2022-06-29 DIAGNOSIS — Z20822 Contact with and (suspected) exposure to covid-19: Secondary | ICD-10-CM | POA: Diagnosis present

## 2022-06-29 DIAGNOSIS — Y92239 Unspecified place in hospital as the place of occurrence of the external cause: Secondary | ICD-10-CM | POA: Diagnosis not present

## 2022-06-29 DIAGNOSIS — J441 Chronic obstructive pulmonary disease with (acute) exacerbation: Secondary | ICD-10-CM | POA: Diagnosis present

## 2022-06-29 DIAGNOSIS — Z8711 Personal history of peptic ulcer disease: Secondary | ICD-10-CM

## 2022-06-29 DIAGNOSIS — Z6820 Body mass index (BMI) 20.0-20.9, adult: Secondary | ICD-10-CM

## 2022-06-29 DIAGNOSIS — E440 Moderate protein-calorie malnutrition: Secondary | ICD-10-CM | POA: Diagnosis present

## 2022-06-29 DIAGNOSIS — T82856A Stenosis of peripheral vascular stent, initial encounter: Secondary | ICD-10-CM | POA: Diagnosis not present

## 2022-06-29 DIAGNOSIS — Z9842 Cataract extraction status, left eye: Secondary | ICD-10-CM

## 2022-06-29 DIAGNOSIS — Z89421 Acquired absence of other right toe(s): Secondary | ICD-10-CM

## 2022-06-29 DIAGNOSIS — I743 Embolism and thrombosis of arteries of the lower extremities: Secondary | ICD-10-CM | POA: Diagnosis not present

## 2022-06-29 DIAGNOSIS — E039 Hypothyroidism, unspecified: Secondary | ICD-10-CM | POA: Diagnosis present

## 2022-06-29 DIAGNOSIS — M86671 Other chronic osteomyelitis, right ankle and foot: Secondary | ICD-10-CM | POA: Diagnosis not present

## 2022-06-29 DIAGNOSIS — R079 Chest pain, unspecified: Secondary | ICD-10-CM

## 2022-06-29 LAB — COMPREHENSIVE METABOLIC PANEL
ALT: 20 U/L (ref 0–44)
AST: 26 U/L (ref 15–41)
Albumin: 3.5 g/dL (ref 3.5–5.0)
Alkaline Phosphatase: 73 U/L (ref 38–126)
Anion gap: 9 (ref 5–15)
BUN: 20 mg/dL (ref 8–23)
CO2: 26 mmol/L (ref 22–32)
Calcium: 8.7 mg/dL — ABNORMAL LOW (ref 8.9–10.3)
Chloride: 104 mmol/L (ref 98–111)
Creatinine, Ser: 1.17 mg/dL — ABNORMAL HIGH (ref 0.44–1.00)
GFR, Estimated: 49 mL/min — ABNORMAL LOW (ref >60)
Glucose, Bld: 110 mg/dL — ABNORMAL HIGH (ref 70–99)
Potassium: 2.8 mmol/L — ABNORMAL LOW (ref 3.5–5.1)
Sodium: 139 mmol/L (ref 135–145)
Total Bilirubin: 0.8 mg/dL (ref 0.3–1.2)
Total Protein: 6.7 g/dL (ref 6.5–8.1)

## 2022-06-29 LAB — CBC WITH DIFFERENTIAL/PLATELET
Abs Immature Granulocytes: 0.06 10*3/uL (ref 0.00–0.07)
Basophils Absolute: 0.1 10*3/uL (ref 0.0–0.1)
Basophils Relative: 1 %
Eosinophils Absolute: 0 10*3/uL (ref 0.0–0.5)
Eosinophils Relative: 0 %
HCT: 37.2 % (ref 36.0–46.0)
Hemoglobin: 12.1 g/dL (ref 12.0–15.0)
Immature Granulocytes: 1 %
Lymphocytes Relative: 14 %
Lymphs Abs: 1.9 10*3/uL (ref 0.7–4.0)
MCH: 28.3 pg (ref 26.0–34.0)
MCHC: 32.5 g/dL (ref 30.0–36.0)
MCV: 87.1 fL (ref 80.0–100.0)
Monocytes Absolute: 0.6 10*3/uL (ref 0.1–1.0)
Monocytes Relative: 4 %
Neutro Abs: 10.7 10*3/uL — ABNORMAL HIGH (ref 1.7–7.7)
Neutrophils Relative %: 80 %
Platelets: 260 10*3/uL (ref 150–400)
RBC: 4.27 MIL/uL (ref 3.87–5.11)
RDW: 13.6 % (ref 11.5–15.5)
WBC: 13.3 10*3/uL — ABNORMAL HIGH (ref 4.0–10.5)
nRBC: 0 % (ref 0.0–0.2)

## 2022-06-29 LAB — ETHANOL: Alcohol, Ethyl (B): <10 mg/dL (ref <10)

## 2022-06-29 LAB — BLOOD GAS, VENOUS
Acid-Base Excess: 4 mmol/L — ABNORMAL HIGH (ref 0.0–2.0)
Bicarbonate: 30.2 mmol/L — ABNORMAL HIGH (ref 20.0–28.0)
O2 Saturation: 62.6 %
Patient temperature: 37
pCO2, Ven: 51 mmHg (ref 44–60)
pH, Ven: 7.38 (ref 7.25–7.43)
pO2, Ven: 38 mmHg (ref 32–45)

## 2022-06-29 LAB — PROCALCITONIN: Procalcitonin: <0.1 ng/mL

## 2022-06-29 LAB — COOXEMETRY PANEL
Carboxyhemoglobin: 0.8 % (ref 0.5–1.5)
Methemoglobin: 0.9 % (ref 0.0–1.5)
O2 Saturation: 43.5 %
Total hemoglobin: 12.8 g/dL (ref 12.0–16.0)
Total oxygen content: 42.8 %

## 2022-06-29 LAB — LACTIC ACID, PLASMA
Lactic Acid, Venous: 2.5 mmol/L (ref 0.5–1.9)
Lactic Acid, Venous: 1.3 mmol/L (ref 0.5–1.9)

## 2022-06-29 LAB — SARS CORONAVIRUS 2 BY RT PCR: SARS Coronavirus 2 by RT PCR: NEGATIVE

## 2022-06-29 LAB — TROPONIN I (HIGH SENSITIVITY)
Troponin I (High Sensitivity): 13 ng/L (ref <18)
Troponin I (High Sensitivity): 14 ng/L (ref <18)

## 2022-06-29 LAB — BRAIN NATRIURETIC PEPTIDE: B Natriuretic Peptide: 30.9 pg/mL (ref 0.0–100.0)

## 2022-06-29 LAB — MAGNESIUM: Magnesium: 1.6 mg/dL — ABNORMAL LOW (ref 1.7–2.4)

## 2022-06-29 MED ORDER — VANCOMYCIN HCL IN DEXTROSE 1-5 GM/200ML-% IV SOLN
1000.0000 mg | Freq: Once | INTRAVENOUS | Status: AC
  Administered 2022-06-29: 1000 mg via INTRAVENOUS
  Filled 2022-06-29: qty 200

## 2022-06-29 MED ORDER — IPRATROPIUM-ALBUTEROL 0.5-2.5 (3) MG/3ML IN SOLN
3.0000 mL | Freq: Once | RESPIRATORY_TRACT | Status: AC
  Administered 2022-06-29: 3 mL via RESPIRATORY_TRACT

## 2022-06-29 MED ORDER — ALPRAZOLAM 0.5 MG PO TABS
1.0000 mg | ORAL_TABLET | Freq: Once | ORAL | Status: AC
  Administered 2022-06-29: 1 mg via ORAL
  Filled 2022-06-29: qty 2

## 2022-06-29 MED ORDER — METHYLPREDNISOLONE SODIUM SUCC 125 MG IJ SOLR
125.0000 mg | Freq: Once | INTRAMUSCULAR | Status: AC
  Administered 2022-06-29: 125 mg via INTRAVENOUS
  Filled 2022-06-29: qty 2

## 2022-06-29 MED ORDER — SODIUM CHLORIDE 0.9 % IV SOLN
2.0000 g | Freq: Once | INTRAVENOUS | Status: AC
  Administered 2022-06-30: 2 g via INTRAVENOUS
  Filled 2022-06-29: qty 12.5

## 2022-06-29 MED ORDER — POTASSIUM CHLORIDE 10 MEQ/100ML IV SOLN
10.0000 meq | INTRAVENOUS | Status: AC
  Administered 2022-06-30: 10 meq via INTRAVENOUS
  Filled 2022-06-29: qty 100

## 2022-06-29 MED ORDER — METRONIDAZOLE 500 MG/100ML IV SOLN
500.0000 mg | Freq: Once | INTRAVENOUS | Status: AC
  Administered 2022-06-29: 500 mg via INTRAVENOUS
  Filled 2022-06-29: qty 100

## 2022-06-29 MED ORDER — IPRATROPIUM-ALBUTEROL 0.5-2.5 (3) MG/3ML IN SOLN
3.0000 mL | Freq: Once | RESPIRATORY_TRACT | Status: DC
  Filled 2022-06-29: qty 3

## 2022-06-29 MED ORDER — MAGNESIUM SULFATE 2 GM/50ML IV SOLN
2.0000 g | Freq: Once | INTRAVENOUS | Status: AC
  Administered 2022-06-29: 2 g via INTRAVENOUS
  Filled 2022-06-29: qty 50

## 2022-06-29 MED ORDER — IPRATROPIUM-ALBUTEROL 0.5-2.5 (3) MG/3ML IN SOLN
3.0000 mL | Freq: Once | RESPIRATORY_TRACT | Status: AC
  Administered 2022-06-29: 3 mL via RESPIRATORY_TRACT
  Filled 2022-06-29: qty 3

## 2022-06-29 MED ORDER — SODIUM CHLORIDE 0.9 % IV BOLUS
500.0000 mL | Freq: Once | INTRAVENOUS | Status: AC
  Administered 2022-06-29: 500 mL via INTRAVENOUS

## 2022-06-29 NOTE — Sepsis Progress Note (Signed)
Elink following Code Sepsis. 
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
## 2022-06-29 NOTE — Progress Notes (Signed)
PHARMACY -  BRIEF ANTIBIOTIC NOTE   Pharmacy has received consult(s) for Cefepime & Vancomycin from an ED provider.  The patient's profile has been reviewed for ht/wt/allergies/indication/available labs.    One time order(s) placed for Cefepime 2 gm & Vancomycin 1 gm  Further antibiotics/pharmacy consults should be ordered by admitting physician if indicated.                       Thank you, Renda Rolls, PharmD, [facility] 06/29/2022 10:49 PM

## 2022-06-29 NOTE — Progress Notes (Signed)
CODE SEPSIS - PHARMACY COMMUNICATION  **Broad Spectrum Antibiotics should be administered within 1 hour of Sepsis diagnosis**  Time Code Sepsis Called/Page Received: 2253  Antibiotics Ordered: Cefepime & Vancomycin  Time of 1st antibiotic administration: 2308  Renda Rolls, PharmD, [facility] 06/29/2022 10:50 PM
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
## 2022-06-29 NOTE — ED Notes (Signed)
Pt encouraged to provide urine sample.

## 2022-06-30 ENCOUNTER — Inpatient Hospital Stay: Payer: Medicare HMO

## 2022-06-30 DIAGNOSIS — M869 Osteomyelitis, unspecified: Secondary | ICD-10-CM | POA: Diagnosis not present

## 2022-06-30 DIAGNOSIS — J441 Chronic obstructive pulmonary disease with (acute) exacerbation: Secondary | ICD-10-CM | POA: Diagnosis not present

## 2022-06-30 DIAGNOSIS — E876 Hypokalemia: Secondary | ICD-10-CM

## 2022-06-30 DIAGNOSIS — A419 Sepsis, unspecified organism: Secondary | ICD-10-CM

## 2022-06-30 DIAGNOSIS — J9621 Acute and chronic respiratory failure with hypoxia: Secondary | ICD-10-CM | POA: Diagnosis not present

## 2022-06-30 DIAGNOSIS — E785 Hyperlipidemia, unspecified: Secondary | ICD-10-CM | POA: Diagnosis present

## 2022-06-30 LAB — URINE DRUG SCREEN, QUALITATIVE (ARMC ONLY)
Amphetamines, Ur Screen: NOT DETECTED
Barbiturates, Ur Screen: NOT DETECTED
Benzodiazepine, Ur Scrn: POSITIVE — AB
Cannabinoid 50 Ng, Ur ~~LOC~~: NOT DETECTED
Cocaine Metabolite,Ur ~~LOC~~: NOT DETECTED
MDMA (Ecstasy)Ur Screen: NOT DETECTED
Methadone Scn, Ur: NOT DETECTED
Opiate, Ur Screen: NOT DETECTED
Phencyclidine (PCP) Ur S: NOT DETECTED
Tricyclic, Ur Screen: NOT DETECTED

## 2022-06-30 LAB — CBC
HCT: 33.2 % — ABNORMAL LOW (ref 36.0–46.0)
Hemoglobin: 10.7 g/dL — ABNORMAL LOW (ref 12.0–15.0)
MCH: 27.8 pg (ref 26.0–34.0)
MCHC: 32.2 g/dL (ref 30.0–36.0)
MCV: 86.2 fL (ref 80.0–100.0)
Platelets: 240 10*3/uL (ref 150–400)
RBC: 3.85 MIL/uL — ABNORMAL LOW (ref 3.87–5.11)
RDW: 13.6 % (ref 11.5–15.5)
WBC: 6 10*3/uL (ref 4.0–10.5)
nRBC: 0 % (ref 0.0–0.2)

## 2022-06-30 LAB — URINALYSIS, ROUTINE W REFLEX MICROSCOPIC
Bacteria, UA: NONE SEEN
Bilirubin Urine: NEGATIVE
Glucose, UA: 50 mg/dL — AB
Ketones, ur: 5 mg/dL — AB
Leukocytes,Ua: NEGATIVE
Nitrite: NEGATIVE
Protein, ur: 30 mg/dL — AB
Specific Gravity, Urine: 1.017 (ref 1.005–1.030)
pH: 5 (ref 5.0–8.0)

## 2022-06-30 LAB — BASIC METABOLIC PANEL
Anion gap: 6 (ref 5–15)
BUN: 20 mg/dL (ref 8–23)
CO2: 25 mmol/L (ref 22–32)
Calcium: 8.1 mg/dL — ABNORMAL LOW (ref 8.9–10.3)
Chloride: 108 mmol/L (ref 98–111)
Creatinine, Ser: 0.87 mg/dL (ref 0.44–1.00)
GFR, Estimated: >60 mL/min (ref >60)
Glucose, Bld: 146 mg/dL — ABNORMAL HIGH (ref 70–99)
Potassium: 4 mmol/L (ref 3.5–5.1)
Sodium: 139 mmol/L (ref 135–145)

## 2022-06-30 LAB — HIV ANTIBODY (ROUTINE TESTING W REFLEX): HIV Screen 4th Generation wRfx: NONREACTIVE

## 2022-06-30 LAB — PROTIME-INR
INR: 1.1 (ref 0.8–1.2)
Prothrombin Time: 13.6 seconds (ref 11.4–15.2)

## 2022-06-30 LAB — CORTISOL-AM, BLOOD: Cortisol - AM: 8.7 ug/dL (ref 6.7–22.6)

## 2022-06-30 LAB — PROCALCITONIN: Procalcitonin: <0.1 ng/mL

## 2022-06-30 MED ORDER — ONDANSETRON HCL 4 MG PO TABS: 4.0000 mg | ORAL_TABLET | Freq: Four times a day (QID) | ORAL | Status: DC | PRN

## 2022-06-30 MED ORDER — ACETAMINOPHEN 325 MG RE SUPP: 650.0000 mg | Freq: Four times a day (QID) | RECTAL | Status: DC | PRN

## 2022-06-30 MED ORDER — POTASSIUM CHLORIDE 20 MEQ PO PACK
40.0000 meq | PACK | Freq: Once | ORAL | Status: AC
  Administered 2022-06-30: 40 meq via ORAL
  Filled 2022-06-30: qty 2

## 2022-06-30 MED ORDER — MAGNESIUM HYDROXIDE 400 MG/5ML PO SUSP: 30.0000 mL | Freq: Every day | ORAL | Status: DC | PRN

## 2022-06-30 MED ORDER — ONDANSETRON HCL 4 MG/2ML IJ SOLN: 4.0000 mg | Freq: Four times a day (QID) | INTRAMUSCULAR | Status: DC | PRN

## 2022-06-30 MED ORDER — OXYCODONE HCL 5 MG PO TABS
10.0000 mg | ORAL_TABLET | Freq: Four times a day (QID) | ORAL | Status: DC | PRN
  Administered 2022-06-30 – 2022-07-01 (×2): 10 mg via ORAL
  Filled 2022-06-30 (×2): qty 2

## 2022-06-30 MED ORDER — ENSURE ENLIVE PO LIQD
237.0000 mL | Freq: Three times a day (TID) | ORAL | Status: DC
  Administered 2022-07-01 – 2022-07-07 (×11): 237 mL via ORAL

## 2022-06-30 MED ORDER — ASCORBIC ACID 500 MG PO TABS
500.0000 mg | ORAL_TABLET | Freq: Two times a day (BID) | ORAL | Status: DC
  Administered 2022-07-01 – 2022-07-08 (×13): 500 mg via ORAL
  Filled 2022-06-30 (×13): qty 1

## 2022-06-30 MED ORDER — TRAZODONE HCL 50 MG PO TABS
25.0000 mg | ORAL_TABLET | Freq: Every evening | ORAL | Status: DC | PRN
  Administered 2022-06-30 – 2022-07-07 (×6): 25 mg via ORAL
  Filled 2022-06-30 (×6): qty 1

## 2022-06-30 MED ORDER — VANCOMYCIN HCL 1250 MG/250ML IV SOLN: 1250.0000 mg | INTRAVENOUS | Status: DC

## 2022-06-30 MED ORDER — IPRATROPIUM-ALBUTEROL 0.5-2.5 (3) MG/3ML IN SOLN
3.0000 mL | Freq: Four times a day (QID) | RESPIRATORY_TRACT | Status: DC
  Administered 2022-06-30 – 2022-07-05 (×18): 3 mL via RESPIRATORY_TRACT
  Filled 2022-06-30 (×20): qty 3

## 2022-06-30 MED ORDER — ATORVASTATIN CALCIUM 10 MG PO TABS
10.0000 mg | ORAL_TABLET | Freq: Every day | ORAL | Status: DC
  Administered 2022-06-30 – 2022-07-08 (×8): 10 mg via ORAL
  Filled 2022-06-30 (×8): qty 1

## 2022-06-30 MED ORDER — METHYLPREDNISOLONE SODIUM SUCC 40 MG IJ SOLR
40.0000 mg | Freq: Two times a day (BID) | INTRAMUSCULAR | Status: AC
  Administered 2022-06-30 (×2): 40 mg via INTRAVENOUS
  Filled 2022-06-30 (×2): qty 1

## 2022-06-30 MED ORDER — PIPERACILLIN-TAZOBACTAM 3.375 G IVPB 30 MIN: 3.3750 g | Freq: Four times a day (QID) | INTRAVENOUS | Status: DC

## 2022-06-30 MED ORDER — PIPERACILLIN-TAZOBACTAM 3.375 G IVPB
3.3750 g | Freq: Three times a day (TID) | INTRAVENOUS | Status: DC
  Administered 2022-06-30 – 2022-07-04 (×13): 3.375 g via INTRAVENOUS
  Filled 2022-06-30 (×13): qty 50

## 2022-06-30 MED ORDER — LORAZEPAM 2 MG/ML IJ SOLN: 1.0000 mg | Freq: Once | INTRAMUSCULAR | Status: DC | PRN

## 2022-06-30 MED ORDER — MONTELUKAST SODIUM 10 MG PO TABS
10.0000 mg | ORAL_TABLET | Freq: Every day | ORAL | Status: DC
  Administered 2022-06-30 – 2022-07-08 (×7): 10 mg via ORAL
  Filled 2022-06-30 (×7): qty 1

## 2022-06-30 MED ORDER — PREDNISONE 20 MG PO TABS
40.0000 mg | ORAL_TABLET | Freq: Every day | ORAL | Status: AC
  Administered 2022-07-01 – 2022-07-04 (×3): 40 mg via ORAL
  Filled 2022-06-30 (×3): qty 2

## 2022-06-30 MED ORDER — SODIUM CHLORIDE 0.9 % IV SOLN: INTRAVENOUS | Status: DC | PRN

## 2022-06-30 MED ORDER — ACETAMINOPHEN 325 MG PO TABS
650.0000 mg | ORAL_TABLET | Freq: Four times a day (QID) | ORAL | Status: DC | PRN
  Administered 2022-06-30 – 2022-07-01 (×2): 650 mg via ORAL
  Filled 2022-06-30 (×2): qty 2

## 2022-06-30 MED ORDER — VANCOMYCIN HCL 750 MG/150ML IV SOLN
750.0000 mg | INTRAVENOUS | Status: DC
  Administered 2022-06-30 – 2022-07-02 (×3): 750 mg via INTRAVENOUS
  Filled 2022-06-30 (×3): qty 150

## 2022-06-30 MED ORDER — OXYCODONE HCL 5 MG PO TABS
5.0000 mg | ORAL_TABLET | Freq: Four times a day (QID) | ORAL | Status: DC | PRN
  Administered 2022-06-30: 5 mg via ORAL
  Filled 2022-06-30: qty 1

## 2022-06-30 MED ORDER — ALPRAZOLAM 0.5 MG PO TABS
1.0000 mg | ORAL_TABLET | Freq: Three times a day (TID) | ORAL | Status: DC | PRN
Start: 2022-06-30 — End: 2022-07-01
  Administered 2022-06-30 – 2022-07-01 (×5): 1 mg via ORAL
  Filled 2022-06-30 (×5): qty 2

## 2022-06-30 MED ORDER — GADOBUTROL 1 MMOL/ML IV SOLN
5.0000 mL | Freq: Once | INTRAVENOUS | Status: AC | PRN
  Administered 2022-06-30: 5 mL via INTRAVENOUS

## 2022-06-30 MED ORDER — KETOROLAC TROMETHAMINE 15 MG/ML IJ SOLN
15.0000 mg | Freq: Four times a day (QID) | INTRAMUSCULAR | Status: AC | PRN
Start: 2022-06-30 — End: 2022-07-05
  Administered 2022-06-30 – 2022-07-04 (×9): 15 mg via INTRAVENOUS
  Filled 2022-06-30 (×10): qty 1

## 2022-06-30 MED ORDER — POTASSIUM CHLORIDE IN NACL 40-0.9 MEQ/L-% IV SOLN
INTRAVENOUS | Status: DC
  Filled 2022-06-30 (×6): qty 1000

## 2022-06-30 MED ORDER — VANCOMYCIN HCL IN DEXTROSE 1-5 GM/200ML-% IV SOLN: 1000.0000 mg | Freq: Once | INTRAVENOUS | Status: DC

## 2022-06-30 MED ORDER — ACETAMINOPHEN 650 MG RE SUPP: 650.0000 mg | Freq: Four times a day (QID) | RECTAL | Status: DC | PRN

## 2022-06-30 MED ORDER — ADULT MULTIVITAMIN W/MINERALS CH
1.0000 | ORAL_TABLET | Freq: Every day | ORAL | Status: DC
  Administered 2022-07-01 – 2022-07-08 (×6): 1 via ORAL
  Filled 2022-06-30 (×6): qty 1

## 2022-06-30 MED ORDER — ACETAMINOPHEN 325 MG PO TABS: 650.0000 mg | ORAL_TABLET | Freq: Four times a day (QID) | ORAL | Status: DC | PRN

## 2022-06-30 MED ORDER — ENOXAPARIN SODIUM 40 MG/0.4ML IJ SOSY
40.0000 mg | PREFILLED_SYRINGE | INTRAMUSCULAR | Status: DC
Start: 2022-06-30 — End: 2022-07-04
  Administered 2022-06-30 – 2022-07-02 (×3): 40 mg via SUBCUTANEOUS
  Filled 2022-06-30 (×3): qty 0.4

## 2022-06-30 MED ORDER — ZINC SULFATE 220 (50 ZN) MG PO CAPS
220.0000 mg | ORAL_CAPSULE | Freq: Every day | ORAL | Status: DC
  Administered 2022-07-01 – 2022-07-08 (×6): 220 mg via ORAL
  Filled 2022-06-30 (×6): qty 1

## 2022-06-30 NOTE — ED Notes (Signed)
Pt did not tolerate IV potassium Diluted with NS and turned down to 25/hr. Pt still was unable to tolerate.

## 2022-06-30 NOTE — Progress Notes (Signed)
PROGRESS NOTE    [facility]   UUV:[id number] DOB: 1946-05-30  DOA: 06/29/2022 Date of Service: 06/30/22 PCP: Albina Billet, MD     Brief Narrative / Hospital Course:  [name] is a 76 y.o. Caucasian female with medical history significant for CAD, COPD, depression, dyslipidemia, hypertension and hypothyroidism, who presented to the ER 06/29/2022 with acute onset of worsening dyspnea with associated cough productive of yellow sputum as well as wheezing over the last 4 to 5 days.  Also complaining of foot pain and ulceration, 08/10: Tachycardic 126, tachypneic 26, on baseline 2 L [place] at home and was requiring 4 L O2 [place].  Hypokalemia 2.8, creatinine 1.17, troponins flat at 13 and 14, lactic acid 2.5 trended down to 1.3, procalcitonin less than 0.1, leukocytosis 13.3.  CXR concerning for bronchial thickening due to acute bronchitis/COPD exacerbation.  No DVT on ultrasound, CT head no concerns, XR left foot no acute concerns, XR right foot concerning for osteomyelitis.  Patient was started on IV antibiotics with cefepime, Flagyl, vancomycin.  500 mL NS bolus.  IV magnesium sulfate, Solu-Medrol, Xanax.  Admitted to hospitalist service with COPD exacerbation with acute on chronic hypoxic respiratory failure, sepsis due to osteomyelitis fifth toe of right foot to continue on vancomycin and Zosyn and podiatry consult obtained, electrolyte derangement including hypokalemia 08/11: MRI ordered R foot (+)  osteomyelitis of the distal phalanx of the fifth toe. Osteonecrosis involving the second, fourth, and fifth metatarsal heads. Bone marrow edema distal phalanx of the great toe posttraumatic vs early acute osteomyelitis.    Consultants:  Podiatry  Procedures: none    Subjective: Patient reports mild headache, foot pain, arm pain where the IV is.  No chest pain/shortness of breath.  No vision changes.  Reports anxiety, asking about next Xanax dose.     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 Supplemental O2   Osteomyelitis of fifth toe of right foot (HCC) (+) sepsis POA as manifested by leukocytosis, tachycardia and tachypnea. MRI results as per hospital course  IV vancomycin and Zosyn. Pain management will be provided. Podiatry consult   Hypothyroidism - We will continue Synthroid.  Hypomagnesemia - Magnesium will be replaced  Hypokalemia - Potassium will be replaced.  Essential hypertension - We will continue antihypertensives  Anxiety and depression - We will continue Xanax and Wellbutrin XL as well as Lexapro.  Dyslipidemia - We will continue statin therapy.    DVT prophylaxis: lovenox Code Status: full Family Communication: None at this time, patient declines call to family/support person Disposition Plan / TOC needs: Pending clinical course, if needing amputation may need SNF placement for rehab Barriers to discharge / significant pending items: IV antibiotics for osteomyelitis/sepsis, podiatry consult pending             Objective: Vitals:   06/30/22 0830 06/30/22 0920 06/30/22 1159 06/30/22 1200  BP: (!) 143/76  (!) 158/86 (!) 164/87  Pulse: 70 100  81  Resp:   (!) 22 (!) 26  Temp:      TempSrc:  SpO2: 98% 97% 97% 100%  Weight:       No intake or output data in the 24 hours ending 06/30/22 1319 Filed Weights   06/29/22 1947  Weight: 53.4 kg    Examination:  Constitutional:  VS as above General Appearance: alert, NAD, thin, hard of hearing  Neck: No masses, trachea  midline Respiratory: Normal respiratory effort Diminished breath sounds in all lung fields +diffuse wheeze No rhonchi No rales Cardiovascular: S1/S2 normal No lower extremity edema Gastrointestinal: No tenderness Musculoskeletal:  No clubbing/cyanosis of digits Grossly symmetrical movement in all extremities See photos below  Neurological: No cranial nerve deficit on limited exam Alert Psychiatric: Normal judgment/insight Normal mood and affect           Scheduled Medications:   [START ON 07/01/2022] vitamin C  500 mg Oral BID   atorvastatin  10 mg Oral Daily   enoxaparin (LOVENOX) injection  40 mg Subcutaneous Q24H   [START ON 07/01/2022] feeding supplement  237 mL Oral TID BM   ipratropium-albuterol  3 mL Nebulization QID   methylPREDNISolone (SOLU-MEDROL) injection  40 mg Intravenous Q12H   Followed by   Derrill Memo ON 07/01/2022] predniSONE  40 mg Oral Q breakfast   montelukast  10 mg Oral Daily   [START ON 07/01/2022] multivitamin with minerals  1 tablet Oral Daily   [START ON 07/01/2022] zinc sulfate  220 mg Oral Daily    Continuous Infusions:  0.9 % NaCl with KCl 40 mEq / L 100 mL/hr at 06/30/22 0434   piperacillin-tazobactam (ZOSYN)  IV 3.375 g (06/30/22 0859)   vancomycin      PRN Medications:  acetaminophen **OR** acetaminophen, ALPRAZolam, ketorolac, LORazepam, magnesium hydroxide, ondansetron **OR** ondansetron (ZOFRAN) IV, traZODone  Antimicrobials:  Anti-infectives (From admission, onward)    Start     Dose/Rate Route Frequency Ordered Stop   07/01/22 1200  vancomycin (VANCOREADY) IVPB 1250 mg/250 mL  Status:  Discontinued        1,250 mg 166.7 mL/hr over 90 Minutes Intravenous Every 48 hours 06/30/22 0224 06/30/22 1100   06/30/22 2200  vancomycin (VANCOREADY) IVPB 750 mg/150 mL        750 mg 150 mL/hr over 60 Minutes Intravenous Every 24 hours 06/30/22 1100     06/30/22 0600  piperacillin-tazobactam (ZOSYN) IVPB 3.375 g        3.375 g 12.5 mL/hr  over 240 Minutes Intravenous Every 8 hours 06/30/22 0227     06/30/22 0215  vancomycin (VANCOCIN) IVPB 1000 mg/200 mL premix  Status:  Discontinued        1,000 mg 200 mL/hr over 60 Minutes Intravenous  Once 06/30/22 0210 06/30/22 0223   06/30/22 0215  piperacillin-tazobactam (ZOSYN) IVPB 3.375 g  Status:  Discontinued        3.375 g 100 mL/hr over 30 Minutes Intravenous Every 6 hours 06/30/22 0210 06/30/22 0226   06/29/22 2245  ceFEPIme (MAXIPIME) 2 g in sodium chloride 0.9 % 100 mL IVPB        2 g 200 mL/hr over 30 Minutes Intravenous  Once 06/29/22 2242 06/30/22 0159   06/29/22 2245  metroNIDAZOLE (FLAGYL) IVPB 500 mg        500 mg 100 mL/hr over 60 Minutes Intravenous  Once 06/29/22 2242 06/30/22 0042   06/29/22 2245  vancomycin (VANCOCIN) IVPB 1000 mg/200 mL premix        1,000 mg 200 mL/hr over 60 Minutes Intravenous  Once 06/29/22 2242 06/30/22 0120  Data Reviewed: I have personally reviewed following labs and imaging studies  CBC: Recent Labs  Lab 06/29/22 2118 06/30/22 0526  WBC 13.3* 6.0  NEUTROABS 10.7*  --   HGB 12.1 10.7*  HCT 37.2 33.2*  MCV 87.1 86.2  PLT 260 440   Basic Metabolic Panel: Recent Labs  Lab 06/29/22 2118 06/30/22 0526  NA 139 139  K 2.8* 4.0  CL 104 108  CO2 26 25  GLUCOSE 110* 146*  BUN 20 20  CREATININE 1.17* 0.87  CALCIUM 8.7* 8.1*  MG 1.6*  --    GFR: Estimated Creatinine Clearance: 47.1 mL/min (by C-G formula based on SCr of 0.87 mg/dL). Liver Function Tests: Recent Labs  Lab 06/29/22 2118  AST 26  ALT 20  ALKPHOS 73  BILITOT 0.8  PROT 6.7  ALBUMIN 3.5   No results for input(s): "LIPASE", "AMYLASE" in the last 168 hours. No results for input(s): "AMMONIA" in the last 168 hours. Coagulation Profile: Recent Labs  Lab 06/30/22 0526  INR 1.1   Cardiac Enzymes: No results for input(s): "CKTOTAL", "CKMB", "CKMBINDEX", "TROPONINI" in the last 168 hours. BNP (last 3 results) No results for input(s): "PROBNP" in  the last 8760 hours. HbA1C: No results for input(s): "HGBA1C" in the last 72 hours. CBG: No results for input(s): "GLUCAP" in the last 168 hours. Lipid Profile: No results for input(s): "CHOL", "HDL", "LDLCALC", "TRIG", "CHOLHDL", "LDLDIRECT" in the last 72 hours. Thyroid Function Tests: No results for input(s): "TSH", "T4TOTAL", "FREET4", "T3FREE", "THYROIDAB" in the last 72 hours. Anemia Panel: No results for input(s): "VITAMINB12", "FOLATE", "FERRITIN", "TIBC", "IRON", "RETICCTPCT" in the last 72 hours. Urine analysis:    Component Value Date/Time   COLORURINE YELLOW (A) 06/29/2022 2358   APPEARANCEUR HAZY (A) 06/29/2022 2358   LABSPEC 1.017 06/29/2022 2358   PHURINE 5.0 06/29/2022 2358   GLUCOSEU 50 (A) 06/29/2022 2358   HGBUR MODERATE (A) 06/29/2022 2358   BILIRUBINUR NEGATIVE 06/29/2022 2358   KETONESUR 5 (A) 06/29/2022 2358   PROTEINUR 30 (A) 06/29/2022 2358   NITRITE NEGATIVE 06/29/2022 2358   LEUKOCYTESUR NEGATIVE 06/29/2022 2358   Sepsis Labs: '@LABRCNTIP'$ (procalcitonin:4,lacticidven:4)  Recent Results (from the past 240 hour(s))  Blood culture (routine x 2)     Status: None (Preliminary result)   Collection Time: 06/29/22  7:53 PM   Specimen: BLOOD  Result Value Ref Range Status   Specimen Description BLOOD LEFT FOREARM  Final   Special Requests IN PEDIATRIC BOTTLE Blood Culture adequate volume  Final   Culture   Final    NO GROWTH < 12 HOURS Performed at [facility], [address] [place], [place] [address]    Report Status PENDING  Incomplete  SARS Coronavirus 2 by RT PCR (hospital order, performed in [place] hospital lab) *cepheid single result test* Anterior Nasal Swab     Status: None   Collection Time: 06/29/22  7:53 PM   Specimen: Anterior Nasal Swab  Result Value Ref Range Status   SARS Coronavirus 2 by RT PCR NEGATIVE NEGATIVE Final    Comment: (NOTE) SARS-CoV-2 target nucleic acids are NOT DETECTED.  The SARS-CoV-2 RNA is generally  detectable in upper and lower respiratory specimens during the acute phase of infection. The lowest concentration of SARS-CoV-2 viral copies this assay can detect is 250 copies / mL. A negative result does not preclude SARS-CoV-2 infection and should not be used as the sole basis for treatment or other patient management decisions.  A negative result may occur with  improper specimen collection / handling, submission of specimen other than nasopharyngeal swab, presence of viral mutation(s) within the areas targeted by this assay, and inadequate number of viral copies (<250 copies / mL). A negative result must be combined with clinical observations, patient history, and epidemiological information.  Fact Sheet for Patients:   https://www.patel.info/  Fact Sheet for Healthcare Providers: https://hall.com/  This test is not yet approved or  cleared by the [place] FDA and has been authorized for detection and/or diagnosis of SARS-CoV-2 by FDA under an Emergency Use Authorization (EUA).  This EUA will remain in effect (meaning this test can be used) for the duration of the COVID-19 declaration under Section 564(b)(1) of the Act, 21 U.S.C. section 360bbb-3(b)(1), unless the authorization is terminated or revoked sooner.  Performed at [facility], [address] [place], [place] [address]   Culture, blood (Routine X 2) w Reflex to ID Panel     Status: None (Preliminary result)   Collection Time: 06/29/22  9:20 PM   Specimen: BLOOD  Result Value Ref Range Status   Specimen Description BLOOD BLOOD LEFT FOREARM  Final   Special Requests   Final    BOTTLES DRAWN AEROBIC AND ANAEROBIC Blood Culture adequate volume   Culture   Final    NO GROWTH < 12 HOURS Performed at [facility], [address], [place], [place] [address]    Report Status PENDING  Incomplete         Radiology Studies: DG Foot Complete  Left  Result Date: 06/29/2022 CLINICAL DATA:  toe discolation EXAM: LEFT FOOT - COMPLETE 3+ VIEW COMPARISON:  None Available. FINDINGS: No cortical erosion or destruction. Cortical irregularity along the head of the second digit proximal phalanx likely old healed fracture. There is no evidence of fracture or dislocation. There is no evidence of arthropathy or other focal bone abnormality. Soft tissues are unremarkable. IMPRESSION: 1. No cortical erosion or destruction to suggest osteomyelitis of the left foot. 2. No definite acute displaced fracture or dislocation. Electronically Signed   By: Iven Finn M.D.   On: 06/29/2022 23:30   DG Foot Complete Right  Result Date: 06/29/2022 CLINICAL DATA:  toe discolation EXAM: RIGHT FOOT COMPLETE - 3+ VIEW COMPARISON:  X-ray right foot 05/07/2022 FINDINGS: Query interval development of cortical erosion of the fifth digit distal phalanx. Query prior second, third, fourth toe phalangeal amputation. Chronic cortical irregularity/nonaggressive periosteal reaction along the fourth metatarsal body and neck. No cortical erosion or destruction. There is no evidence of fracture or dislocation. Subcutaneus soft tissue edema. IMPRESSION: Query interval development of cortical erosion of the fifth digit distal phalanx. Consider MRI (with contrast if GFR greater than 30) for further evaluation of possible underlying osteomyelitis. Electronically Signed   By: Iven Finn M.D.   On: 06/29/2022 23:28   CT HEAD WO CONTRAST (5MM)  Result Date: 06/29/2022 CLINICAL DATA:  Mental status change, unknown cause EXAM: CT HEAD WITHOUT CONTRAST TECHNIQUE: Contiguous axial images were obtained from the base of the skull through the vertex without intravenous contrast. RADIATION DOSE REDUCTION: This exam was performed according to the departmental dose-optimization program which includes automated exposure control, adjustment of the mA and/or kV according to patient size and/or use of  iterative reconstruction technique. COMPARISON:  Head CT 05/07/2022 FINDINGS: Brain: No intracranial hemorrhage, mass effect, or midline shift. Stable degree of atrophy. No hydrocephalus. The basilar cisterns are patent. Stable periventricular and deep white matter hypodensity typical of chronic small vessel ischemic change.  No evidence of territorial infarct or acute ischemia. No extra-axial or intracranial fluid collection. Vascular: Atherosclerosis of skullbase vasculature without hyperdense vessel or abnormal calcification. Skull: No fracture or focal lesion. Sinuses/Orbits: No acute findings. The paranasal sinuses are clear. No mastoid effusion. Bilateral cataract resection. Other: None. IMPRESSION: 1. No acute intracranial abnormality. 2. Stable atrophy and chronic small vessel ischemic change. Electronically Signed   By: Keith Rake M.D.   On: 06/29/2022 22:08   US Venous Img Lower Unilateral Right  Result Date: 06/29/2022 CLINICAL DATA:  Initial evaluation for acute leg swelling. EXAM: RIGHT LOWER EXTREMITY VENOUS DOPPLER ULTRASOUND TECHNIQUE: Gray-scale sonography with graded compression, as well as color Doppler and duplex ultrasound were performed to evaluate the lower extremity deep venous systems from the level of the common femoral vein and including the common femoral, femoral, profunda femoral, popliteal and calf veins including the posterior tibial, peroneal and gastrocnemius veins when visible. The superficial great saphenous vein was also interrogated. Spectral Doppler was utilized to evaluate flow at rest and with distal augmentation maneuvers in the common femoral, femoral and popliteal veins. COMPARISON:  None Available. FINDINGS: Contralateral Common Femoral Vein: Respiratory phasicity is normal and symmetric with the symptomatic side. No evidence of thrombus. Normal compressibility. Common Femoral Vein: No evidence of thrombus. Normal compressibility, respiratory phasicity and  response to augmentation. Saphenofemoral Junction: No evidence of thrombus. Normal compressibility and flow on color Doppler imaging. Profunda Femoral Vein: No evidence of thrombus. Normal compressibility and flow on color Doppler imaging. Femoral Vein: No evidence of thrombus. Normal compressibility, respiratory phasicity and response to augmentation. Popliteal Vein: No evidence of thrombus. Normal compressibility, respiratory phasicity and response to augmentation. Calf Veins: No evidence of thrombus. Normal compressibility and flow on color Doppler imaging. Superficial Great Saphenous Vein: No evidence of thrombus. Normal compressibility. Venous Reflux:  None. Other Findings:  None. IMPRESSION: No evidence of deep venous thrombosis. Electronically Signed   By: Jeannine Boga M.D.   On: 06/29/2022 21:44   DG Chest Portable 1 View  Result Date: 06/29/2022 CLINICAL DATA:  Shortness of breath. EXAM: PORTABLE CHEST 1 VIEW COMPARISON:  Radiograph 05/07/2022 FINDINGS: The lungs are hyperinflated. There is increased bronchial thickening from prior exam. The heart is normal in size with normal mediastinal contours. Aortic atherosclerosis. No acute airspace disease. No pneumothorax, large pleural effusion, or evidence of pulmonary edema. Chronic change of the right shoulder. IMPRESSION: Increased bronchial thickening from prior exam, may be acute bronchitis or COPD exacerbation. Electronically Signed   By: Keith Rake M.D.   On: 06/29/2022 20:08            LOS: 1 day       Emeterio Reeve, DO [facility] 06/30/2022, 1:19 PM   Staff may message me via secure chat in [place]  but this may not receive immediate response,  please page for urgent matters!  If 7PM-7AM, please contact night-coverage [email or web address]  Dictation software was used to generate the above note. Typos may occur and escape review, as with typed/written notes. Please contact Dr Sheppard Coil directly for clarity  if needed.

## 2022-06-30 NOTE — Assessment & Plan Note (Signed)
repleted   Serial BMP

## 2022-06-30 NOTE — H&P (Signed)
[place]   PATIENT NAME: [name]    MR#:  [id number]  DATE OF BIRTH:  03-26-1946  DATE OF ADMISSION:  06/29/2022  PRIMARY CARE PHYSICIAN: Albina Billet, MD   Patient is coming from: Home  REQUESTING/REFERRING PHYSICIAN: Hinda Kehr, MD  CHIEF COMPLAINT:   Chief Complaint  Patient presents with   Shortness of Breath   Altered Mental Status    HISTORY OF PRESENT ILLNESS:  [name] is a 76 y.o. Caucasian female with medical history significant for CAD, COPD, depression, dyslipidemia, hypertension and hypothyroidism, who presented to the ER with acute onset of worsening dyspnea with associated cough productive of yellow sputum as well as wheezing over the last 4 to 5 days.  She denies any fever or chills.  No nausea or vomiting or abdominal pain.  She admitted to right foot swelling.  She has been feeling generally weak over the last week.  No chest pain or palpitations.  No dysuria, oliguria or hematuria or flank pain.  ED Course: When she came to the ER, BP was 148/108 with a heart rate of 126 respiratory rate rate 26 and pulse oximetry  was 98% on 4 L of O2 by nasal cannula compared to baseline 2 L at home.  Labs revealed hypokalemia of 2.8 and a creatinine 1.17, magnesium 1.6 and high sensitive troponin I of 13 and later 14, lactic acid 2.5 and later 1.3 and procalcitonin less than 0.1 with CBC showing leukocytosis 13.3 with neutrophilia.  Methemoglobin was 0.9.  Imaging: Left foot x-ray showed no acute abnormalities.  Right foot x-ray showed query interval development of cortical erosion of the fifth digit distal phalanx.  The patient was given 1 mg of p.o. Xanax, 2 g of IV cefepime, 3 DuoNebs, 2 g of IV magnesium sulfate, 125 mg IV Solu-Medrol, IV Flagyl and vancomycin as well as 500 mill IV normal saline bolus.  She will be admitted to a medical telemetry bed for further evaluation and management. PAST MEDICAL HISTORY:   Past Medical History:  Diagnosis Date    Anxiety    CAD (coronary artery disease)    Carpal tunnel syndrome    Cataract    Complication of anesthesia    has woken up at times   COPD (chronic obstructive pulmonary disease) (HCC)    CRP elevated 09/14/2015   Depression    Difficult intubation    Dyslipidemia    Dyspnea    DOE   Dysrhythmia    hx palpatations   Elevated sedimentation rate 09/14/2015   Esophageal spasm    Gastrointestinal parasites    GERD (gastroesophageal reflux disease)    HCAP (healthcare-associated pneumonia) 10/22/2018   Hiatal hernia    History of peptic ulcer disease    Hypertension    Hyperthyroidism    Hypothyroidism    Low magnesium levels 09/14/2015   Pelvic fracture ([place]) 2008   fall from riding a horse   Peripheral vascular disease ([place])    Reflux    Rotator cuff injury    Sepsis ([place]) 10/22/2018   Stenosis, spinal, lumbar     PAST SURGICAL HISTORY:   Past Surgical History:  Procedure Laterality Date   AMPUTATION TOE Right 12/26/2020   Procedure: AMPUTATION TOE-3rd Toes;  Surgeon: Samara Deist, DPM;  Location: [facility];  Service: Podiatry;  Laterality: Right;   AMPUTATION TOE Right 04/22/2021   Procedure: AMPUTATION TOE- RIGHT 2ND;  Surgeon: Samara Deist, DPM;  Location: [facility];  Service: Podiatry;  Laterality: Right;   AMPUTATION TOE Right 10/21/2021   Procedure: AMPUTATION TOE;  Surgeon: Caroline More, DPM;  Location: [facility];  Service: Podiatry;  Laterality: Right;   AMPUTATION TOE Right 11/23/2021   Procedure: AMPUTATION TOE;  Surgeon: Sharlotte Alamo, DPM;  Location: [facility];  Service: Podiatry;  Laterality: Right;   APPENDECTOMY     BACK SURGERY     CERVICAL FUSION   CARPAL TUNNEL RELEASE     CATARACT EXTRACTION W/PHACO Right 08/07/2017   Procedure: CATARACT EXTRACTION PHACO AND INTRAOCULAR LENS PLACEMENT ([place]);  Surgeon: Birder Robson, MD;  Location: [facility];  Service: Ophthalmology;  Laterality: Right;  [place] 00:52.0 AP% 16.8 CDE 8.74 Fluid Pack Lot # [id number]    CATARACT EXTRACTION W/PHACO Left 09/04/2017   Procedure: CATARACT EXTRACTION PHACO AND INTRAOCULAR LENS PLACEMENT ([place]);  Surgeon: Birder Robson, MD;  Location: [facility];  Service: Ophthalmology;  Laterality: Left;  [place] 00:34 AP% 17.0 CDE 5.80 Fluid pack lot # [id number]   CESAREAN SECTION     CHOLECYSTECTOMY     FRACTURE SURGERY     HIP SURGERY     INTRAMEDULLARY (IM) NAIL INTERTROCHANTERIC Right 10/01/2016   Procedure: INTRAMEDULLARY (IM) NAIL INTERTROCHANTRIC;  Surgeon: Corky Mull, MD;  Location: [facility];  Service: Orthopedics;  Laterality: Right;   INTRAMEDULLARY (IM) NAIL INTERTROCHANTERIC Left 01/14/2021   Procedure: INTRAMEDULLARY (IM) NAIL INTERTROCHANTRIC;  Surgeon: Lovell Sheehan, MD;  Location: [facility];  Service: Orthopedics;  Laterality: Left;   KYPHOPLASTY N/A 08/20/2018   Procedure: [id number];  Surgeon: Hessie Knows, MD;  Location: [facility];  Service: Orthopedics;  Laterality: N/A;   LOWER EXTREMITY ANGIOGRAPHY Right 12/18/2019   Procedure: LOWER EXTREMITY ANGIOGRAPHY;  Surgeon: Algernon Huxley, MD;  Location: [place] [facility];  Service: Cardiovascular;  Laterality: Right;   LOWER EXTREMITY ANGIOGRAPHY Right 12/02/2020   Procedure: LOWER EXTREMITY ANGIOGRAPHY;  Surgeon: Algernon Huxley, MD;  Location: [place] [facility];  Service: Cardiovascular;  Laterality: Right;   LOWER EXTREMITY ANGIOGRAPHY Right 12/23/2020   Procedure: Lower Extremity Angiography;  Surgeon: Algernon Huxley, MD;  Location: [place] [facility];  Service: Cardiovascular;  Laterality: Right;   LOWER EXTREMITY ANGIOGRAPHY Right 12/24/2020   Procedure: Lower Extremity Angiography;  Surgeon: Algernon Huxley, MD;  Location: [place] [facility];  Service: Cardiovascular;  Laterality: Right;   LOWER EXTREMITY ANGIOGRAPHY Right 10/20/2021   Procedure: Lower Extremity Angiography;  Surgeon: Algernon Huxley, MD;  Location: [place] [facility];  Service: Cardiovascular;  Laterality: Right;   NECK SURGERY     NOSE SURGERY      ROTATOR CUFF REPAIR     x2   SHOULDER ARTHROSCOPY  12/07/2011   Procedure: ARTHROSCOPY SHOULDER;  Surgeon: Ninetta Lights, MD;  Location: [facility];  Service: Orthopedics;  Laterality: Right;  Debridement Partial Cuff Tear, Release Coracoacromial Ligament   SHOULDER SURGERY  12/07/2011   right    SOCIAL HISTORY:   Social History   Tobacco Use   Smoking status: Some Days    Packs/day: 0.00    Types: Cigarettes   Smokeless tobacco: Never   Tobacco comments:    pt states 2 cigs per month   Substance Use Topics   Alcohol use: No    FAMILY HISTORY:   Family History  Problem Relation Age of Onset   Aneurysm Other     DRUG ALLERGIES:  No Known Allergies  REVIEW OF SYSTEMS:   ROS As per history of  present illness. All pertinent systems were reviewed above. Constitutional, HEENT, cardiovascular, respiratory, GI, GU, musculoskeletal, neuro, psychiatric, endocrine, integumentary and hematologic systems were reviewed and are otherwise negative/unremarkable except for positive findings mentioned above in the HPI.   MEDICATIONS AT HOME:   Prior to Admission medications   Medication Sig Start Date End Date Taking? Authorizing Provider  ALPRAZolam Duanne Moron) 1 MG tablet Take 1 mg by mouth 3 (three) times daily as needed. 10/06/21  Yes [provider]  COMBIVENT RESPIMAT 20-100 MCG/ACT AERS respimat Inhale 1-2 puffs into the lungs 4 (four) times daily. 11/25/21  Yes Wieting, Richard, MD  traZODone (DESYREL) 150 MG tablet Take 300 mg by mouth at bedtime as needed. 09/22/21  Yes [provider]  albuterol (VENTOLIN HFA) 108 (90 Base) MCG/ACT inhaler Inhale 1-2 puffs into the lungs every 4 (four) hours as needed for shortness of breath or wheezing. 06/28/22   [provider]  amLODipine (NORVASC) 5 MG tablet Take 5 mg by mouth daily. Patient not taking: Reported on 06/29/2022 08/31/21   [provider]  ASPIRIN LOW DOSE 81 MG tablet TAKE 1  TABLET BY MOUTH EVERY DAY Patient not taking: Reported on 06/29/2022 04/18/22   Algernon Huxley, MD  atorvastatin (LIPITOR) 10 MG tablet Take 1 tablet (10 mg total) by mouth daily. Patient not taking: Reported on 06/29/2022 12/18/19 11/20/21  Algernon Huxley, MD  buPROPion [facility] SR) 150 MG 12 hr tablet Take 150 mg by mouth daily.  Patient not taking: Reported on 06/29/2022    [provider]  clopidogrel (PLAVIX) 75 MG tablet Take 75 mg by mouth daily. Patient not taking: Reported on 06/29/2022 08/31/21   [provider]  escitalopram (LEXAPRO) 10 MG tablet Take 10 mg by mouth daily in the afternoon. Patient not taking: Reported on 06/29/2022 10/15/13   [provider]  feeding supplement (ENSURE ENLIVE / ENSURE PLUS) LIQD Take 237 mLs by mouth at bedtime. Patient not taking: Reported on 06/29/2022 11/25/21   Loletha Grayer, MD  Fluticasone-Salmeterol (ADVAIR) 250-50 MCG/DOSE AEPB Inhale 1 puff into the lungs 2 (two) times daily. Patient not taking: Reported on 06/29/2022 11/25/21 12/25/21  Loletha Grayer, MD  levothyroxine (SYNTHROID, LEVOTHROID) 88 MCG tablet Take 88 mcg by mouth daily before breakfast.  Patient not taking: Reported on 06/29/2022    [provider]  montelukast (SINGULAIR) 10 MG tablet Take 10 mg by mouth daily. Patient not taking: Reported on 06/29/2022    [provider]  nicotine (NICODERM CQ - DOSED IN MG/24 HOURS) 21 mg/24hr patch 1 patch chest wall daily (okay to substitute generic) Patient not taking: Reported on 06/29/2022 11/25/21   Loletha Grayer, MD  nutrition supplement, JUVEN, (JUVEN) PACK Take 1 packet by mouth 2 (two) times daily between meals. Patient not taking: Reported on 06/29/2022 11/25/21   Loletha Grayer, MD  oxyCODONE (ROXICODONE) 5 MG immediate release tablet Take 1 tablet (5 mg total) by mouth every 4 (four) hours as needed for severe pain. Patient not taking: Reported on 06/29/2022 05/01/22   Felipa Furnace, DPM   polyethylene glycol (MIRALAX / GLYCOLAX) 17 g packet Take 17 g by mouth daily. Patient not taking: Reported on 06/29/2022 11/25/21   Loletha Grayer, MD  senna-docusate (SENOKOT-S) 8.6-50 MG tablet Take 2 tablets by mouth at bedtime. Patient not taking: Reported on 06/29/2022 11/25/21   Loletha Grayer, MD  Vitamin D, Ergocalciferol, (DRISDOL) 1.25 MG (50000 UNIT) CAPS capsule Take 1 capsule (50,000 Units total) by mouth every 7 (  seven) days. Patient not taking: Reported on 06/29/2022 12/01/21   Loletha Grayer, MD      VITAL SIGNS:  Blood pressure 116/64, pulse 95, temperature 98 F (36.7 C), temperature source Oral, resp. rate (!) 22, weight 53.4 kg, SpO2 91 %.  PHYSICAL EXAMINATION:  Physical Exam  GENERAL:  76 y.o.-year-old Caucasian female patient lying in the bed with no acute distress.  EYES: Pupils equal, round, reactive to light and accommodation. No scleral icterus. Extraocular muscles intact.  HEENT: Head atraumatic, normocephalic. Oropharynx and nasopharynx clear.  NECK:  Supple, no jugular venous distention. No thyroid enlargement, no tenderness.  LUNGS: Normal breath sounds bilaterally, no wheezing, rales,rhonchi or crepitation. No use of accessory muscles of respiration.  CARDIOVASCULAR: Regular rate and rhythm, S1, S2 normal. No murmurs, rubs, or gallops.  ABDOMEN: Soft, nondistended, nontender. Bowel sounds present. No organomegaly or mass.  EXTREMITIES: No pedal edema, cyanosis, or clubbing.  NEUROLOGIC: Cranial nerves II through XII are intact. Muscle strength 5/5 in all extremities. Sensation intact. Gait not checked.  PSYCHIATRIC: The patient is alert and oriented x 3.  Normal affect and good eye contact. SKIN: Right fifth digit swelling with erythema and discoloration with mild tenderness and warmth, s/p amputation of the second third and fourth right foot digits.  She also has a medial lower leg ulcer with a scab without discharge.       LABORATORY PANEL:    CBC Recent Labs  Lab 06/30/22 0526  WBC 6.0  HGB 10.7*  HCT 33.2*  PLT 240   ------------------------------------------------------------------------------------------------------------------  Chemistries  Recent Labs  Lab 06/29/22 2118  NA 139  K 2.8*  CL 104  CO2 26  GLUCOSE 110*  BUN 20  CREATININE 1.17*  CALCIUM 8.7*  MG 1.6*  AST 26  ALT 20  ALKPHOS 73  BILITOT 0.8   ------------------------------------------------------------------------------------------------------------------  Cardiac Enzymes No results for input(s): "TROPONINI" in the last 168 hours. ------------------------------------------------------------------------------------------------------------------  RADIOLOGY:  DG Foot Complete Left  Result Date: 06/29/2022 CLINICAL DATA:  toe discolation EXAM: LEFT FOOT - COMPLETE 3+ VIEW COMPARISON:  None Available. FINDINGS: No cortical erosion or destruction. Cortical irregularity along the head of the second digit proximal phalanx likely old healed fracture. There is no evidence of fracture or dislocation. There is no evidence of arthropathy or other focal bone abnormality. Soft tissues are unremarkable. IMPRESSION: 1. No cortical erosion or destruction to suggest osteomyelitis of the left foot. 2. No definite acute displaced fracture or dislocation. Electronically Signed   By: Iven Finn M.D.   On: 06/29/2022 23:30   DG Foot Complete Right  Result Date: 06/29/2022 CLINICAL DATA:  toe discolation EXAM: RIGHT FOOT COMPLETE - 3+ VIEW COMPARISON:  X-ray right foot 05/07/2022 FINDINGS: Query interval development of cortical erosion of the fifth digit distal phalanx. Query prior second, third, fourth toe phalangeal amputation. Chronic cortical irregularity/nonaggressive periosteal reaction along the fourth metatarsal body and neck. No cortical erosion or destruction. There is no evidence of fracture or dislocation. Subcutaneus soft tissue edema. IMPRESSION:  Query interval development of cortical erosion of the fifth digit distal phalanx. Consider MRI (with contrast if GFR greater than 30) for further evaluation of possible underlying osteomyelitis. Electronically Signed   By: Iven Finn M.D.   On: 06/29/2022 23:28   CT HEAD WO CONTRAST (5MM)  Result Date: 06/29/2022 CLINICAL DATA:  Mental status change, unknown cause EXAM: CT HEAD WITHOUT CONTRAST TECHNIQUE: Contiguous axial images were obtained from the base of the skull through the vertex without  intravenous contrast. RADIATION DOSE REDUCTION: This exam was performed according to the departmental dose-optimization program which includes automated exposure control, adjustment of the mA and/or kV according to patient size and/or use of iterative reconstruction technique. COMPARISON:  Head CT 05/07/2022 FINDINGS: Brain: No intracranial hemorrhage, mass effect, or midline shift. Stable degree of atrophy. No hydrocephalus. The basilar cisterns are patent. Stable periventricular and deep white matter hypodensity typical of chronic small vessel ischemic change. No evidence of territorial infarct or acute ischemia. No extra-axial or intracranial fluid collection. Vascular: Atherosclerosis of skullbase vasculature without hyperdense vessel or abnormal calcification. Skull: No fracture or focal lesion. Sinuses/Orbits: No acute findings. The paranasal sinuses are clear. No mastoid effusion. Bilateral cataract resection. Other: None. IMPRESSION: 1. No acute intracranial abnormality. 2. Stable atrophy and chronic small vessel ischemic change. Electronically Signed   By: Keith Rake M.D.   On: 06/29/2022 22:08   US Venous Img Lower Unilateral Right  Result Date: 06/29/2022 CLINICAL DATA:  Initial evaluation for acute leg swelling. EXAM: RIGHT LOWER EXTREMITY VENOUS DOPPLER ULTRASOUND TECHNIQUE: Gray-scale sonography with graded compression, as well as color Doppler and duplex ultrasound were performed to evaluate  the lower extremity deep venous systems from the level of the common femoral vein and including the common femoral, femoral, profunda femoral, popliteal and calf veins including the posterior tibial, peroneal and gastrocnemius veins when visible. The superficial great saphenous vein was also interrogated. Spectral Doppler was utilized to evaluate flow at rest and with distal augmentation maneuvers in the common femoral, femoral and popliteal veins. COMPARISON:  None Available. FINDINGS: Contralateral Common Femoral Vein: Respiratory phasicity is normal and symmetric with the symptomatic side. No evidence of thrombus. Normal compressibility. Common Femoral Vein: No evidence of thrombus. Normal compressibility, respiratory phasicity and response to augmentation. Saphenofemoral Junction: No evidence of thrombus. Normal compressibility and flow on color Doppler imaging. Profunda Femoral Vein: No evidence of thrombus. Normal compressibility and flow on color Doppler imaging. Femoral Vein: No evidence of thrombus. Normal compressibility, respiratory phasicity and response to augmentation. Popliteal Vein: No evidence of thrombus. Normal compressibility, respiratory phasicity and response to augmentation. Calf Veins: No evidence of thrombus. Normal compressibility and flow on color Doppler imaging. Superficial Great Saphenous Vein: No evidence of thrombus. Normal compressibility. Venous Reflux:  None. Other Findings:  None. IMPRESSION: No evidence of deep venous thrombosis. Electronically Signed   By: Jeannine Boga M.D.   On: 06/29/2022 21:44   DG Chest Portable 1 View  Result Date: 06/29/2022 CLINICAL DATA:  Shortness of breath. EXAM: PORTABLE CHEST 1 VIEW COMPARISON:  Radiograph 05/07/2022 FINDINGS: The lungs are hyperinflated. There is increased bronchial thickening from prior exam. The heart is normal in size with normal mediastinal contours. Aortic atherosclerosis. No acute airspace disease. No pneumothorax,  large pleural effusion, or evidence of pulmonary edema. Chronic change of the right shoulder. IMPRESSION: Increased bronchial thickening from prior exam, may be acute bronchitis or COPD exacerbation. Electronically Signed   By: Keith Rake M.D.   On: 06/29/2022 20:08      IMPRESSION AND PLAN:  Assessment and Plan: * COPD exacerbation ([place]) - The patient will be admitted to a medical telemetry bed. - We will continue steroid therapy with IV Medrol, bronchodilator therapy with DuoNebs and mucolytic therapy with Mucinex. - O2 protocol will be followed.  Osteomyelitis of fifth toe of right foot ([place]) - The patient has subsequent sepsis as manifested by leukocytosis, tachycardia and tachypnea. - The patient will be placed on IV vancomycin and Zosyn. -  Pain management will be provided. - Podiatry consult will be obtained. - I notified Dr. Luana Shu about the patient.  Acute on chronic respiratory failure with hypoxia (HCC) - O2 protocol will be followed. - Management otherwise as above.  Hypokalemia - Potassium will be replaced.  Hypomagnesemia - Magnesium will be replaced  Essential hypertension - We will continue antihypertensives  Hypothyroidism - We will continue Synthroid.  Dyslipidemia - We will continue statin therapy.  Anxiety and depression - We will continue Xanax and Wellbutrin XL as well as Lexapro.    DVT prophylaxis: Lovenox. Advanced Care Planning:  Code Status: full code. Family Communication:  The plan of care was discussed in details with the patient (and family). I answered all questions. The patient agreed to proceed with the above mentioned plan. Further management will depend upon hospital course. Disposition Plan: Back to previous home environment Consults called: Podiatry. All the records are reviewed and case discussed with ED provider.  Status is: Inpatient    At the time of the admission, it appears that the appropriate admission status for  this patient is inpatient.  This is judged to be reasonable and necessary in order to provide the required intensity of service to ensure the patient's safety given the presenting symptoms, physical exam findings and initial radiographic and laboratory data in the context of comorbid conditions.  The patient requires inpatient status due to high intensity of service, high risk of further deterioration and high frequency of surveillance required.  I certify that at the time of admission, it is my clinical judgment that the patient will require inpatient hospital care extending more than 2 midnights.                            Dispo: The patient is from: Home              Anticipated d/c is to: Home              Patient currently is not medically stable to d/c.              Difficult to place patient: No  Christel Mormon M.D on 06/30/2022 at 5:49 AM  [facility]   From 7 PM-7 AM, contact night-coverage [email or web address]  CC: Primary care physician; Albina Billet, MD

## 2022-06-30 NOTE — Assessment & Plan Note (Addendum)
(+)   sepsis POA as manifested by leukocytosis, tachycardia and tachypnea. 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  Overnight monitoring per podiatry

## 2022-06-30 NOTE — Hospital Course (Addendum)
[name] is a 76 y.o. Caucasian female with medical history significant for CAD, COPD, depression, dyslipidemia, hypertension and hypothyroidism, who presented to the ER 06/29/2022 with acute onset of worsening dyspnea with associated cough productive of yellow sputum as well as wheezing over the last 4 to 5 days.  Also complaining of foot pain and ulceration, 08/10: Tachycardic 126, tachypneic 26, on baseline 2 L [place] at home and was requiring 4 L O2 [place].  Hypokalemia 2.8, creatinine 1.17, troponins flat at 13 and 14, lactic acid 2.5 trended down to 1.3, procalcitonin less than 0.1, leukocytosis 13.3.  CXR concerning for bronchial thickening due to acute bronchitis/COPD exacerbation.  No DVT on ultrasound, CT head no concerns, XR left foot no acute concerns, XR right foot concerning for osteomyelitis.  Patient was started on IV antibiotics with cefepime, Flagyl, vancomycin.  500 mL NS bolus.  IV magnesium sulfate, Solu-Medrol, Xanax.  Admitted to hospitalist service with COPD exacerbation with acute on chronic hypoxic respiratory failure, sepsis due to osteomyelitis fifth toe of right foot to continue on vancomycin and Zosyn and podiatry consult obtained, electrolyte derangement including hypokalemia 08/11: MRI R foot (+) osteomyelitis of the distal phalanx of the fifth toe, osteonecrosis involving the second, fourth, and fifth metatarsal heads. Bone marrow edema distal phalanx of the great toe posttraumatic vs early acute osteomyelitis.  Podiatry consult: Continue IV ABX, weightbearing as tolerated, ABI ordered, recommending vascular consult and pending vascular studies may need to consider amputation after possible angiography versus sooner if necrosis/sepsis 08/12: vascular surgery consult: angiography Monday 08/14.  Leukocytosis likely due to steroids. Sinus tach confirmed on EKG, added BB given HTN. SPO2 at goal on 2 to 4 L . One of 3 BCx (+)likely contaminant.  08/13: SpO2 at goal on 2L, hypertensive  and sinus tach, increased metoprolol.  08/14: RLE Angiogram -  thrombectomy right SFA, popliteal, and anterior tibial arteries and balloon angioplasty right anterior tibial artery and right SFA.  08/15: Podiatry: Planning for right fifth toe amputation planned tentatively for tomorrow 08/16: R 5th toe amputation.  8/17: Requesting stronger pain medicine 8/18: Refusing to leave today as her grandson cannot pick her up.  Her grandson confirmed same and requesting discharge tomorrow morning

## 2022-06-30 NOTE — Progress Notes (Signed)
Pharmacy Antibiotic Note  [name] is a 76 y.o. female admitted on 06/29/2022 with cellulitis.  Pharmacy has been consulted for Vancomycin dosing x 7 days.  Plan: Pt given Vancomycin 1000 mg once. Vancomycin 1250 mg IV Q 48 hrs. Goal AUC 400-550. Expected AUC: 485.7 SCr used: 1.17, TBW 53.4 kg < IBW 54.7 kg  Pharmacy will continue to follow and will adjust abx dosing whenever warranted.  Weight: 53.4 kg (117 lb 11.6 oz)  Temp (24hrs), Avg:98.2 F (36.8 C), Min:98 F (36.7 C), Max:98.3 F (36.8 C)   Recent Labs  Lab 06/29/22 1953 06/29/22 2118 06/29/22 2156  WBC  --  13.3*  --   CREATININE  --  1.17*  --   LATICACIDVEN 2.5*  --  1.3    Estimated Creatinine Clearance: 35 mL/min (A) (by C-G formula based on SCr of 1.17 mg/dL (H)).    No Known Allergies  Antimicrobials this admission: 8/11 Cefepime >> x 1 dose 8/10 Flagyl >> x 1 dose 8/10 Vancomycin >> x 7 days 8/11 Zosyn >>  Microbiology results: 8/10 BCx: Pending 8/10 UCx: Pending   Thank you for allowing pharmacy to be a part of this patient's care.  Renda Rolls, PharmD, [facility] 06/30/2022 2:13 AM

## 2022-06-30 NOTE — Assessment & Plan Note (Deleted)
-   O2 protocol will be followed. - Management otherwise as above. 

## 2022-06-30 NOTE — Consult Note (Signed)
Reason for Consult:PAD, osteomyelitis Referring Physician: Dr Pricilla Larsson [name] is an 76 y.o. female.  HPI: she has a history of PAD, known to Dr Lucky Cowboy. Previously has undergone R 2/3/4 toe amputations with Jefm Bryant. Has transitioned care to Dr Posey Pronto with our group. Was supposed to see him and Dr Lucky Cowboy over summer, but missed her appointments. She claims that her caregivers but her grandson present here tells me privately that that is not true. She often refuses care or to go to her appointments and she just sits on the couch.   Past Medical History:  Diagnosis Date   Anxiety    CAD (coronary artery disease)    Carpal tunnel syndrome    Cataract    Complication of anesthesia    has woken up at times   COPD (chronic obstructive pulmonary disease) (HCC)    CRP elevated 09/14/2015   Depression    Difficult intubation    Dyslipidemia    Dyspnea    DOE   Dysrhythmia    hx palpatations   Elevated sedimentation rate 09/14/2015   Esophageal spasm    Gastrointestinal parasites    GERD (gastroesophageal reflux disease)    HCAP (healthcare-associated pneumonia) 10/22/2018   Hiatal hernia    History of peptic ulcer disease    Hypertension    Hyperthyroidism    Hypothyroidism    Low magnesium levels 09/14/2015   Pelvic fracture ([place]) 2008   fall from riding a horse   Peripheral vascular disease ([place])    Reflux    Rotator cuff injury    Sepsis ([place]) 10/22/2018   Stenosis, spinal, lumbar     Past Surgical History:  Procedure Laterality Date   AMPUTATION TOE Right 12/26/2020   Procedure: AMPUTATION TOE-3rd Toes;  Surgeon: Samara Deist, DPM;  Location: [facility];  Service: Podiatry;  Laterality: Right;   AMPUTATION TOE Right 04/22/2021   Procedure: AMPUTATION TOE- RIGHT 2ND;  Surgeon: Samara Deist, DPM;  Location: [facility];  Service: Podiatry;  Laterality: Right;   AMPUTATION TOE Right 10/21/2021   Procedure: AMPUTATION TOE;  Surgeon: Caroline More, DPM;  Location: [facility];   Service: Podiatry;  Laterality: Right;   AMPUTATION TOE Right 11/23/2021   Procedure: AMPUTATION TOE;  Surgeon: Sharlotte Alamo, DPM;  Location: [facility];  Service: Podiatry;  Laterality: Right;   APPENDECTOMY     BACK SURGERY     CERVICAL FUSION   CARPAL TUNNEL RELEASE     CATARACT EXTRACTION W/PHACO Right 08/07/2017   Procedure: CATARACT EXTRACTION PHACO AND INTRAOCULAR LENS PLACEMENT ([place]);  Surgeon: Birder Robson, MD;  Location: [facility];  Service: Ophthalmology;  Laterality: Right;  [place] 00:52.0 AP% 16.8 CDE 8.74 Fluid Pack Lot # [id number]   CATARACT EXTRACTION W/PHACO Left 09/04/2017   Procedure: CATARACT EXTRACTION PHACO AND INTRAOCULAR LENS PLACEMENT ([place]);  Surgeon: Birder Robson, MD;  Location: [facility];  Service: Ophthalmology;  Laterality: Left;  [place] 00:34 AP% 17.0 CDE 5.80 Fluid pack lot # [id number]   CESAREAN SECTION     CHOLECYSTECTOMY     FRACTURE SURGERY     HIP SURGERY     INTRAMEDULLARY (IM) NAIL INTERTROCHANTERIC Right 10/01/2016   Procedure: INTRAMEDULLARY (IM) NAIL INTERTROCHANTRIC;  Surgeon: Corky Mull, MD;  Location: [facility];  Service: Orthopedics;  Laterality: Right;   INTRAMEDULLARY (IM) NAIL INTERTROCHANTERIC Left 01/14/2021   Procedure: INTRAMEDULLARY (IM) NAIL INTERTROCHANTRIC;  Surgeon: Lovell Sheehan, MD;  Location: [facility];  Service: Orthopedics;  Laterality: Left;   KYPHOPLASTY  N/A 08/20/2018   Procedure: [place] Jabs;  Surgeon: Hessie Knows, MD;  Location: [facility];  Service: Orthopedics;  Laterality: N/A;   LOWER EXTREMITY ANGIOGRAPHY Right 12/18/2019   Procedure: LOWER EXTREMITY ANGIOGRAPHY;  Surgeon: Algernon Huxley, MD;  Location: [place] [facility];  Service: Cardiovascular;  Laterality: Right;   LOWER EXTREMITY ANGIOGRAPHY Right 12/02/2020   Procedure: LOWER EXTREMITY ANGIOGRAPHY;  Surgeon: Algernon Huxley, MD;  Location: [place] [facility];  Service: Cardiovascular;  Laterality: Right;   LOWER EXTREMITY ANGIOGRAPHY Right 12/23/2020   Procedure:  Lower Extremity Angiography;  Surgeon: Algernon Huxley, MD;  Location: [place] [facility];  Service: Cardiovascular;  Laterality: Right;   LOWER EXTREMITY ANGIOGRAPHY Right 12/24/2020   Procedure: Lower Extremity Angiography;  Surgeon: Algernon Huxley, MD;  Location: [place] [facility];  Service: Cardiovascular;  Laterality: Right;   LOWER EXTREMITY ANGIOGRAPHY Right 10/20/2021   Procedure: Lower Extremity Angiography;  Surgeon: Algernon Huxley, MD;  Location: [place] [facility];  Service: Cardiovascular;  Laterality: Right;   NECK SURGERY     NOSE SURGERY     ROTATOR CUFF REPAIR     x2   SHOULDER ARTHROSCOPY  12/07/2011   Procedure: ARTHROSCOPY SHOULDER;  Surgeon: Ninetta Lights, MD;  Location: [place];  Service: Orthopedics;  Laterality: Right;  Debridement Partial Cuff Tear, Release Coracoacromial Ligament   SHOULDER SURGERY  12/07/2011   right    Family History  Problem Relation Age of Onset   Aneurysm Other     Social History:  reports that she has been smoking cigarettes. She has never used smokeless tobacco. She reports that she does not drink alcohol and does not use drugs.  Allergies: No Known Allergies  Medications: I have reviewed the patient's current medications.  Results for orders placed or performed during the hospital encounter of 06/29/22 (from the past 48 hour(s))  Blood gas, venous     Status: Abnormal   Collection Time: 06/29/22  7:53 PM  Result Value Ref Range   pH, Ven 7.38 7.25 - 7.43   pCO2, Ven 51 44 - 60 mmHg   pO2, Ven 38 32 - 45 mmHg   Bicarbonate 30.2 (H) 20.0 - 28.0 mmol/L   Acid-Base Excess 4.0 (H) 0.0 - 2.0 mmol/L   O2 Saturation 62.6 %   Patient temperature 37.0    Collection site VEIN     Comment: Performed at [facility], [address] [place], [place] [address]  Blood culture (routine x 2)     Status: None (Preliminary result)   Collection Time: 06/29/22  7:53 PM   Specimen: BLOOD  Result Value Ref Range    Specimen Description BLOOD LEFT FOREARM    Special Requests IN PEDIATRIC BOTTLE Blood Culture adequate volume    Culture      NO GROWTH < 12 HOURS Performed at [facility], [address] [place], [place] [address]    Report Status PENDING   Lactic acid, plasma     Status: Abnormal   Collection Time: 06/29/22  7:53 PM  Result Value Ref Range   Lactic Acid, Venous 2.5 (HH) 0.5 - 1.9 mmol/L    Comment: CRITICAL RESULT CALLED TO, READ BACK BY AND VERIFIED WITH ELIZABETH TRAYAR 06/29/22 2050 [facility]BH Performed at [facility], [address] [place], [place] [address]   SARS Coronavirus 2 by RT PCR (hospital order, performed in [facility] hospital lab) *cepheid single result test* Anterior Nasal Swab  Status: None   Collection Time: 06/29/22  7:53 PM   Specimen: Anterior Nasal Swab  Result Value Ref Range   SARS Coronavirus 2 by RT PCR NEGATIVE NEGATIVE    Comment: (NOTE) SARS-CoV-2 target nucleic acids are NOT DETECTED.  The SARS-CoV-2 RNA is generally detectable in upper and lower respiratory specimens during the acute phase of infection. The lowest concentration of SARS-CoV-2 viral copies this assay can detect is 250 copies / mL. A negative result does not preclude SARS-CoV-2 infection and should not be used as the sole basis for treatment or other patient management decisions.  A negative result may occur with improper specimen collection / handling, submission of specimen other than nasopharyngeal swab, presence of viral mutation(s) within the areas targeted by this assay, and inadequate number of viral copies (<250 copies / mL). A negative result must be combined with clinical observations, patient history, and epidemiological information.  Fact Sheet for Patients:   https://www.patel.info/  Fact Sheet for Healthcare Providers: https://hall.com/  This test is not yet approved or  cleared by the [place]  FDA and has been authorized for detection and/or diagnosis of SARS-CoV-2 by FDA under an Emergency Use Authorization (EUA).  This EUA will remain in effect (meaning this test can be used) for the duration of the COVID-19 declaration under Section 564(b)(1) of the Act, 21 U.S.C. section 360bbb-3(b)(1), unless the authorization is terminated or revoked sooner.  Performed at [facility], [address] [place], [place] [address]   Cooxemetry Panel, hospital-performed (carboxy, met, total hgb, O2 sat)     Status: None   Collection Time: 06/29/22  8:31 PM  Result Value Ref Range   Total hemoglobin 12.8 12.0 - 16.0 g/dL   O2 Saturation 43.5 %   Carboxyhemoglobin 0.8 0.5 - 1.5 %   Methemoglobin 0.9 0.0 - 1.5 %   Total oxygen content 42.8 %    Comment: Performed at [facility], [address] [place], [place] [address]  Ethanol     Status: None   Collection Time: 06/29/22  9:03 PM  Result Value Ref Range   Alcohol, Ethyl (B) <10 <10 mg/dL    Comment: (NOTE) Lowest detectable limit for serum alcohol is 10 mg/dL.  For medical purposes only. Performed at [facility], [address] [place],  [address]   CBC with Differential     Status: Abnormal   Collection Time: 06/29/22  9:18 PM  Result Value Ref Range   WBC 13.3 (H) 4.0 - 10.5 K/uL   RBC 4.27 3.87 - 5.11 MIL/uL   Hemoglobin 12.1 12.0 - 15.0 g/dL   HCT 37.2 36.0 - 46.0 %   MCV 87.1 80.0 - 100.0 fL   MCH 28.3 26.0 - 34.0 pg   MCHC 32.5 30.0 - 36.0 g/dL   RDW 13.6 11.5 - 15.5 %   Platelets 260 150 - 400 K/uL   nRBC 0.0 0.0 - 0.2 %   Neutrophils Relative % 80 %   Neutro Abs 10.7 (H) 1.7 - 7.7 K/uL   Lymphocytes Relative 14 %   Lymphs Abs 1.9 0.7 - 4.0 K/uL   Monocytes Relative 4 %   Monocytes Absolute 0.6 0.1 - 1.0 K/uL   Eosinophils Relative 0 %   Eosinophils Absolute 0.0 0.0 - 0.5 K/uL   Basophils Relative 1 %   Basophils Absolute 0.1 0.0 - 0.1 K/uL   Immature Granulocytes 1 %    Abs Immature Granulocytes 0.06 0.00 - 0.07 K/uL  Comment: Performed at [facility], [address] [place], [place] [address]  Comprehensive metabolic panel     Status: Abnormal   Collection Time: 06/29/22  9:18 PM  Result Value Ref Range   Sodium 139 135 - 145 mmol/L   Potassium 2.8 (L) 3.5 - 5.1 mmol/L   Chloride 104 98 - 111 mmol/L   CO2 26 22 - 32 mmol/L   Glucose, Bld 110 (H) 70 - 99 mg/dL    Comment: Glucose reference range applies only to samples taken after fasting for at least 8 hours.   BUN 20 8 - 23 mg/dL   Creatinine, Ser 1.17 (H) 0.44 - 1.00 mg/dL   Calcium 8.7 (L) 8.9 - 10.3 mg/dL   Total Protein 6.7 6.5 - 8.1 g/dL   Albumin 3.5 3.5 - 5.0 g/dL   AST 26 15 - 41 U/L   ALT 20 0 - 44 U/L   Alkaline Phosphatase 73 38 - 126 U/L   Total Bilirubin 0.8 0.3 - 1.2 mg/dL   GFR, Estimated 49 (L) >60 mL/min    Comment: (NOTE) Calculated using the CKD-EPI Creatinine Equation (2021)    Anion gap 9 5 - 15    Comment: Performed at [facility], [place], [place] [address]  Troponin I (High Sensitivity)     Status: None   Collection Time: 06/29/22  9:18 PM  Result Value Ref Range   Troponin I (High Sensitivity) 13 <18 ng/L    Comment: (NOTE) Elevated high sensitivity troponin I (hsTnI) values and significant  changes across serial measurements may suggest ACS but many other  chronic and acute conditions are known to elevate hsTnI results.  Refer to the "Links" section for chest pain algorithms and additional  guidance. Performed at [facility], [address] [place], [place] [address]   Brain natriuretic peptide     Status: None   Collection Time: 06/29/22  9:18 PM  Result Value Ref Range   B Natriuretic Peptide 30.9 0.0 - 100.0 pg/mL    Comment: Performed at [facility], [address] [place], [place] [address]  Procalcitonin - Baseline     Status: None   Collection Time: 06/29/22  9:18 PM  Result Value Ref  Range   Procalcitonin <0.10 ng/mL    Comment:        Interpretation: PCT (Procalcitonin) <= 0.5 ng/mL: Systemic infection (sepsis) is not likely. Local bacterial infection is possible. (NOTE)       Sepsis PCT Algorithm           Lower Respiratory Tract                                      Infection PCT Algorithm    ----------------------------     ----------------------------         PCT < 0.25 ng/mL                PCT < 0.10 ng/mL          Strongly encourage             Strongly discourage   discontinuation of antibiotics    initiation of antibiotics    ----------------------------     -----------------------------       PCT 0.25 - 0.50 ng/mL            PCT 0.10 - 0.25 ng/mL  OR       >80% decrease in PCT            Discourage initiation of                                            antibiotics      Encourage discontinuation           of antibiotics    ----------------------------     -----------------------------         PCT >= 0.50 ng/mL              PCT 0.26 - 0.50 ng/mL               AND        <80% decrease in PCT             Encourage initiation of                                             antibiotics       Encourage continuation           of antibiotics    ----------------------------     -----------------------------        PCT >= 0.50 ng/mL                  PCT > 0.50 ng/mL               AND         increase in PCT                  Strongly encourage                                      initiation of antibiotics    Strongly encourage escalation           of antibiotics                                     -----------------------------                                           PCT <= 0.25 ng/mL                                                 OR                                        > 80% decrease in PCT                                      Discontinue / Do not initiate  antibiotics  Performed at [facility], [address] [place], [place] [address]   Magnesium     Status: Abnormal   Collection Time: 06/29/22  9:18 PM  Result Value Ref Range   Magnesium 1.6 (L) 1.7 - 2.4 mg/dL    Comment: Performed at [facility], [address] [place], [place] [address]  Culture, blood (Routine X 2) w Reflex to ID Panel     Status: None (Preliminary result)   Collection Time: 06/29/22  9:20 PM   Specimen: BLOOD  Result Value Ref Range   Specimen Description BLOOD BLOOD LEFT FOREARM    Special Requests      BOTTLES DRAWN AEROBIC AND ANAEROBIC Blood Culture adequate volume   Culture      NO GROWTH < 12 HOURS Performed at [facility], [address], [place], [place] [address]    Report Status PENDING   Lactic acid, plasma     Status: None   Collection Time: 06/29/22  9:56 PM  Result Value Ref Range   Lactic Acid, Venous 1.3 0.5 - 1.9 mmol/L    Comment: Performed at [facility], [address] [place], [place] [address]  Troponin I (High Sensitivity)     Status: None   Collection Time: 06/29/22  9:56 PM  Result Value Ref Range   Troponin I (High Sensitivity) 14 <18 ng/L    Comment: (NOTE) Elevated high sensitivity troponin I (hsTnI) values and significant  changes across serial measurements may suggest ACS but many other  chronic and acute conditions are known to elevate hsTnI results.  Refer to the "Links" section for chest pain algorithms and additional  guidance. Performed at [facility], [address] [place], [place] [address]   Urine Drug Screen, Qualitative [facility] only)     Status: Abnormal   Collection Time: 06/29/22 11:21 PM  Result Value Ref Range   Tricyclic, Ur Screen NONE DETECTED NONE DETECTED   Amphetamines, Ur Screen NONE DETECTED NONE DETECTED   MDMA (Ecstasy)Ur Screen NONE DETECTED NONE DETECTED   Cocaine Metabolite,Ur [place] NONE DETECTED NONE DETECTED   Opiate, Ur Screen NONE DETECTED NONE DETECTED   Phencyclidine  (PCP) Ur S NONE DETECTED NONE DETECTED   Cannabinoid 50 Ng, Ur [place] NONE DETECTED NONE DETECTED   Barbiturates, Ur Screen NONE DETECTED NONE DETECTED   Benzodiazepine, Ur Scrn POSITIVE (A) NONE DETECTED   Methadone Scn, Ur NONE DETECTED NONE DETECTED    Comment: (NOTE) Tricyclics + metabolites, urine    Cutoff 1000 ng/mL Amphetamines + metabolites, urine  Cutoff 1000 ng/mL MDMA (Ecstasy), urine              Cutoff 500 ng/mL Cocaine Metabolite, urine          Cutoff 300 ng/mL Opiate + metabolites, urine        Cutoff 300 ng/mL Phencyclidine (PCP), urine         Cutoff 25 ng/mL Cannabinoid, urine                 Cutoff 50 ng/mL Barbiturates + metabolites, urine  Cutoff 200 ng/mL Benzodiazepine, urine              Cutoff 200 ng/mL Methadone, urine                   Cutoff 300 ng/mL  The urine drug screen provides only a preliminary, unconfirmed analytical test result and should not be used for non-medical purposes. Clinical consideration and  professional judgment should be applied to any positive drug screen result due to possible interfering substances. A more specific alternate chemical method must be used in order to obtain a confirmed analytical result. Gas chromatography / mass spectrometry (GC/MS) is the preferred confirm atory method. Performed at [facility], [address] [place], [place] [address]   Urinalysis, Routine w reflex microscopic     Status: Abnormal   Collection Time: 06/29/22 11:58 PM  Result Value Ref Range   Color, Urine YELLOW (A) YELLOW   APPearance HAZY (A) CLEAR   Specific Gravity, Urine 1.017 1.005 - 1.030   pH 5.0 5.0 - 8.0   Glucose, UA 50 (A) NEGATIVE mg/dL   Hgb urine dipstick MODERATE (A) NEGATIVE   Bilirubin Urine NEGATIVE NEGATIVE   Ketones, ur 5 (A) NEGATIVE mg/dL   Protein, ur 30 (A) NEGATIVE mg/dL   Nitrite NEGATIVE NEGATIVE   Leukocytes,Ua NEGATIVE NEGATIVE   RBC / HPF 21-50 0 - 5 RBC/hpf   WBC, UA 11-20 0 - 5 WBC/hpf    Bacteria, UA NONE SEEN NONE SEEN   Squamous Epithelial / LPF 0-5 0 - 5   Mucus PRESENT    Hyaline Casts, UA PRESENT    Amorphous Crystal PRESENT     Comment: Performed at [facility], [address], [place], [place] [address]  Procalcitonin     Status: None   Collection Time: 06/30/22  5:26 AM  Result Value Ref Range   Procalcitonin <0.10 ng/mL    Comment:        Interpretation: PCT (Procalcitonin) <= 0.5 ng/mL: Systemic infection (sepsis) is not likely. Local bacterial infection is possible. (NOTE)       Sepsis PCT Algorithm           Lower Respiratory Tract                                      Infection PCT Algorithm    ----------------------------     ----------------------------         PCT < 0.25 ng/mL                PCT < 0.10 ng/mL          Strongly encourage             Strongly discourage   discontinuation of antibiotics    initiation of antibiotics    ----------------------------     -----------------------------       PCT 0.25 - 0.50 ng/mL            PCT 0.10 - 0.25 ng/mL               OR       >80% decrease in PCT            Discourage initiation of                                            antibiotics      Encourage discontinuation           of antibiotics    ----------------------------     -----------------------------         PCT >= 0.50 ng/mL              PCT 0.26 - 0.50  ng/mL               AND        <80% decrease in PCT             Encourage initiation of                                             antibiotics       Encourage continuation           of antibiotics    ----------------------------     -----------------------------        PCT >= 0.50 ng/mL                  PCT > 0.50 ng/mL               AND         increase in PCT                  Strongly encourage                                      initiation of antibiotics    Strongly encourage escalation           of antibiotics                                      -----------------------------                                           PCT <= 0.25 ng/mL                                                 OR                                        > 80% decrease in PCT                                      Discontinue / Do not initiate                                             antibiotics  Performed at [facility], [address] [place], [place] [address]   HIV Antibody (routine testing w rflx)     Status: None   Collection Time: 06/30/22  5:26 AM  Result Value Ref Range   HIV Screen 4th Generation wRfx Non Reactive Non Reactive    Comment: Performed at [facility], [place] [address], [place], [place] [address]  Protime-INR     Status: None   Collection Time: 06/30/22  5:26 AM  Result Value Ref Range   Prothrombin Time 13.6 11.4 - 15.2 seconds   INR 1.1 0.8 - 1.2    Comment: (NOTE) INR goal varies based on device and disease states. Performed at [facility], [address] [place], [place] [address]   Cortisol-am, blood     Status: None   Collection Time: 06/30/22  5:26 AM  Result Value Ref Range   Cortisol - AM 8.7 6.7 - 22.6 ug/dL    Comment: Performed at [facility] [address] [place], [place] [address]  Basic metabolic panel     Status: Abnormal   Collection Time: 06/30/22  5:26 AM  Result Value Ref Range   Sodium 139 135 - 145 mmol/L   Potassium 4.0 3.5 - 5.1 mmol/L   Chloride 108 98 - 111 mmol/L   CO2 25 22 - 32 mmol/L   Glucose, Bld 146 (H) 70 - 99 mg/dL    Comment: Glucose reference range applies only to samples taken after fasting for at least 8 hours.   BUN 20 8 - 23 mg/dL   Creatinine, Ser 0.87 0.44 - 1.00 mg/dL   Calcium 8.1 (L) 8.9 - 10.3 mg/dL   GFR, Estimated >60 >60 mL/min    Comment: (NOTE) Calculated using the CKD-EPI Creatinine Equation (2021)    Anion gap 6 5 - 15    Comment: Performed at [facility], [address] [place], [place] [address]  CBC     Status:  Abnormal   Collection Time: 06/30/22  5:26 AM  Result Value Ref Range   WBC 6.0 4.0 - 10.5 K/uL   RBC 3.85 (L) 3.87 - 5.11 MIL/uL   Hemoglobin 10.7 (L) 12.0 - 15.0 g/dL   HCT 33.2 (L) 36.0 - 46.0 %   MCV 86.2 80.0 - 100.0 fL   MCH 27.8 26.0 - 34.0 pg   MCHC 32.2 30.0 - 36.0 g/dL   RDW 13.6 11.5 - 15.5 %   Platelets 240 150 - 400 K/uL   nRBC 0.0 0.0 - 0.2 %    Comment: Performed at [facility], [address], [place], [place] [address]    MR FOOT RIGHT W WO CONTRAST  Result Date: 06/30/2022 CLINICAL DATA:  Discoloration of toes.  Abnormal x-ray EXAM: MRI OF THE RIGHT FOREFOOT WITHOUT AND WITH CONTRAST TECHNIQUE: Multiplanar, multisequence MR imaging of the right forefoot was performed before and after the administration of intravenous contrast. CONTRAST:  12m GADAVIST GADOBUTROL 1 MMOL/ML IV SOLN COMPARISON:  X-ray 06/29/2022 FINDINGS: Bones/Joint/Cartilage Status post second, third, and fourth toe amputations. There is bone marrow edema and enhancement within the distal phalanx of the fifth toe with confluent low T1 marrow signal change compatible with acute osteomyelitis. Bone marrow edema and mild enhancement is also present throughout the distal phalanx of the great toe with some intermediate T1 bone marrow signal, which could be posttraumatic or represent early acute osteomyelitis. Changes of osteonecrosis involving the second, fourth, and fifth metatarsal heads. Osseous structures appear otherwise intact. No malalignment. Ligaments Intact Lisfranc ligament.  No acute collateral ligament injury. Muscles and Tendons Post amputation changes within the musculotendinous structures related to the second through fourth toes. Chronic fatty infiltration of the musculature. No tenosynovial fluid collection. Soft tissues Soft tissue edema of the fifth toe.  No fluid collections. IMPRESSION: 1. Acute osteomyelitis of the distal phalanx of the fifth toe. 2. Bone marrow edema and mild  enhancement throughout the distal phalanx of the great toe,  which could be posttraumatic or represent early acute osteomyelitis. 3. Status post second, third, and fourth toe amputations. 4. Changes of osteonecrosis involving the second, fourth, and fifth metatarsal heads. Electronically Signed   By: Davina Poke D.O.   On: 06/30/2022 10:24   DG Foot Complete Left  Result Date: 06/29/2022 CLINICAL DATA:  toe discolation EXAM: LEFT FOOT - COMPLETE 3+ VIEW COMPARISON:  None Available. FINDINGS: No cortical erosion or destruction. Cortical irregularity along the head of the second digit proximal phalanx likely old healed fracture. There is no evidence of fracture or dislocation. There is no evidence of arthropathy or other focal bone abnormality. Soft tissues are unremarkable. IMPRESSION: 1. No cortical erosion or destruction to suggest osteomyelitis of the left foot. 2. No definite acute displaced fracture or dislocation. Electronically Signed   By: Iven Finn M.D.   On: 06/29/2022 23:30   DG Foot Complete Right  Result Date: 06/29/2022 CLINICAL DATA:  toe discolation EXAM: RIGHT FOOT COMPLETE - 3+ VIEW COMPARISON:  X-ray right foot 05/07/2022 FINDINGS: Query interval development of cortical erosion of the fifth digit distal phalanx. Query prior second, third, fourth toe phalangeal amputation. Chronic cortical irregularity/nonaggressive periosteal reaction along the fourth metatarsal body and neck. No cortical erosion or destruction. There is no evidence of fracture or dislocation. Subcutaneus soft tissue edema. IMPRESSION: Query interval development of cortical erosion of the fifth digit distal phalanx. Consider MRI (with contrast if GFR greater than 30) for further evaluation of possible underlying osteomyelitis. Electronically Signed   By: Iven Finn M.D.   On: 06/29/2022 23:28   CT HEAD WO CONTRAST (5MM)  Result Date: 06/29/2022 CLINICAL DATA:  Mental status change, unknown cause EXAM: CT  HEAD WITHOUT CONTRAST TECHNIQUE: Contiguous axial images were obtained from the base of the skull through the vertex without intravenous contrast. RADIATION DOSE REDUCTION: This exam was performed according to the departmental dose-optimization program which includes automated exposure control, adjustment of the mA and/or kV according to patient size and/or use of iterative reconstruction technique. COMPARISON:  Head CT 05/07/2022 FINDINGS: Brain: No intracranial hemorrhage, mass effect, or midline shift. Stable degree of atrophy. No hydrocephalus. The basilar cisterns are patent. Stable periventricular and deep white matter hypodensity typical of chronic small vessel ischemic change. No evidence of territorial infarct or acute ischemia. No extra-axial or intracranial fluid collection. Vascular: Atherosclerosis of skullbase vasculature without hyperdense vessel or abnormal calcification. Skull: No fracture or focal lesion. Sinuses/Orbits: No acute findings. The paranasal sinuses are clear. No mastoid effusion. Bilateral cataract resection. Other: None. IMPRESSION: 1. No acute intracranial abnormality. 2. Stable atrophy and chronic small vessel ischemic change. Electronically Signed   By: Keith Rake M.D.   On: 06/29/2022 22:08   US Venous Img Lower Unilateral Right  Result Date: 06/29/2022 CLINICAL DATA:  Initial evaluation for acute leg swelling. EXAM: RIGHT LOWER EXTREMITY VENOUS DOPPLER ULTRASOUND TECHNIQUE: Gray-scale sonography with graded compression, as well as color Doppler and duplex ultrasound were performed to evaluate the lower extremity deep venous systems from the level of the common femoral vein and including the common femoral, femoral, profunda femoral, popliteal and calf veins including the posterior tibial, peroneal and gastrocnemius veins when visible. The superficial great saphenous vein was also interrogated. Spectral Doppler was utilized to evaluate flow at rest and with distal  augmentation maneuvers in the common femoral, femoral and popliteal veins. COMPARISON:  None Available. FINDINGS: Contralateral Common Femoral Vein: Respiratory phasicity is normal and symmetric with the symptomatic side. No evidence of  thrombus. Normal compressibility. Common Femoral Vein: No evidence of thrombus. Normal compressibility, respiratory phasicity and response to augmentation. Saphenofemoral Junction: No evidence of thrombus. Normal compressibility and flow on color Doppler imaging. Profunda Femoral Vein: No evidence of thrombus. Normal compressibility and flow on color Doppler imaging. Femoral Vein: No evidence of thrombus. Normal compressibility, respiratory phasicity and response to augmentation. Popliteal Vein: No evidence of thrombus. Normal compressibility, respiratory phasicity and response to augmentation. Calf Veins: No evidence of thrombus. Normal compressibility and flow on color Doppler imaging. Superficial Great Saphenous Vein: No evidence of thrombus. Normal compressibility. Venous Reflux:  None. Other Findings:  None. IMPRESSION: No evidence of deep venous thrombosis. Electronically Signed   By: Jeannine Boga M.D.   On: 06/29/2022 21:44   DG Chest Portable 1 View  Result Date: 06/29/2022 CLINICAL DATA:  Shortness of breath. EXAM: PORTABLE CHEST 1 VIEW COMPARISON:  Radiograph 05/07/2022 FINDINGS: The lungs are hyperinflated. There is increased bronchial thickening from prior exam. The heart is normal in size with normal mediastinal contours. Aortic atherosclerosis. No acute airspace disease. No pneumothorax, large pleural effusion, or evidence of pulmonary edema. Chronic change of the right shoulder. IMPRESSION: Increased bronchial thickening from prior exam, may be acute bronchitis or COPD exacerbation. Electronically Signed   By: Keith Rake M.D.   On: 06/29/2022 20:08    Review of Systems  Musculoskeletal:  Positive for joint pain.  All other systems reviewed and are  negative.  Blood pressure (!) 151/95, pulse 98, temperature 98.4 F (36.9 C), temperature source Oral, resp. rate 19, weight 53.4 kg, SpO2 96 %.  Vitals:   06/30/22 1932 06/30/22 2053  BP: (!) 151/95   Pulse: 98   Resp: 19   Temp: 98.4 F (36.9 C)   SpO2: 97% 96%    General AA&O x3. Normal mood and affect.  Vascular Non palpable pulses. Foot is cool  Neurologic Epicritic sensation grossly present.  Dermatologic (Wound) Ulceration medial 5th toe with cyanosis  Orthopedic: Motor intact BLE.      Assessment/Plan:  Osteomyelitis 5th toe, PAD -Imaging: Studies independently reviewed -Antibiotics: continue IV abx -WB Status: WBAT -ABIs ordered. Recommend vascular surgery consult. Expect she will need repeat angiography -Surgical Plan: pending vascular studies/plans. Would prefer to amputate following vascular but if no revasc necessary or the toe becomes necrotic or she has signs of sepsis we will proceed prior to this  [name] 06/30/2022, 10:01 PM   Best available via secure chat for questions or concerns.

## 2022-06-30 NOTE — Progress Notes (Signed)
OT Cancellation Note  Patient Details Name: [name] MRN: [id number] DOB: 1946/02/11   Cancelled Treatment:    Reason Eval/Treat Not Completed: Other (comment) (per MD hold mobility until podiatry consult. OT will follow up as able.Shanon Payor, OTD OTR/L  06/30/22, 12:52 PM

## 2022-06-30 NOTE — Assessment & Plan Note (Signed)
repleted  Serial Mg labs

## 2022-06-30 NOTE — Assessment & Plan Note (Signed)
   continue Xanax as well as Lexapro, Wellbutrin.  Trazodone at bedtime as needed

## 2022-06-30 NOTE — ED Notes (Addendum)
Pt up to wheelchair with assist x2 for MRI. Pt. Bed stripped and changed, fresh linens applied.

## 2022-06-30 NOTE — Assessment & Plan Note (Signed)
Continue antihypertensives. ?

## 2022-06-30 NOTE — ED Notes (Signed)
Pt in MRI.

## 2022-06-30 NOTE — Progress Notes (Signed)
Initial Nutrition Assessment  DOCUMENTATION CODES:   Not applicable  INTERVENTION:   -Once diet is advanced, add:  -Ensure Enlive po TID, each supplement provides 350 kcal and 20 grams of protein -MVI with minerals daily -500 mg vitamin C BID -220 mg zinc sulfate daily x 14 days  NUTRITION DIAGNOSIS:   Increased nutrient needs related to chronic illness (COPD) as evidenced by estimated needs.  GOAL:   Patient will meet greater than or equal to 90% of their needs  MONITOR:   PO intake, Supplement acceptance, Diet advancement  REASON FOR ASSESSMENT:   Consult Assessment of nutrition requirement/status  ASSESSMENT:   Pt with medical history significant for CAD, COPD, depression, dyslipidemia, hypertension and hypothyroidism, who presented with acute onset of worsening dyspnea with associated cough productive of yellow sputum as well as wheezing over the last 4 to 5 days.  Pt admitted with COPD exacerbation.   Pt unavailable at time of visit. RD unable to obtain further nutrition-related history or complete nutrition-focused physical exam at this time.    Per chart review, pt with osteomyelitis of fifth toe of rt foot. Podiatry consult pending. Pt currently NPO.   Pt with history of malnutrition, which RD suspects is ongoing. Pt would greatly benefit from addition of oral nutrition supplements.   Reviewed wt hx; pt has experienced a 4.3% wt loss over the past 2 months, which is not significant for time frame.   Medications reviewed and include solu-medrol and 0.9% NaCl with KCl 40 mEq/L infusion @ 100 ml/hr.   Labs reviewed: CBGS: 103 (inpatient orders for glycemic control are none).    Diet Order:   Diet Order             Diet NPO time specified Except for: Sips with Meds  Diet effective now                   EDUCATION NEEDS:   No education needs have been identified at this time  Skin:  Skin Assessment: Reviewed RN Assessment  Last BM:   Unknown  Height:   Ht Readings from Last 1 Encounters:  05/07/22 '5\' 4"'$  (1.626 m)    Weight:   Wt Readings from Last 1 Encounters:  06/29/22 53.4 kg    Ideal Body Weight:  54.5 kg  BMI:  Body mass index is 20.21 kg/m.  Estimated Nutritional Needs:   Kcal:  1650-1850  Protein:  85-100 grams  Fluid:  > 1.6 L    Loistine Chance, RD, LDN, [place] Registered Dietitian II Certified Diabetes Care and Education Specialist Please refer to [facility] for RD and/or RD on-call/weekend/after hours pager

## 2022-06-30 NOTE — ED Notes (Signed)
MD at bedside. 

## 2022-06-30 NOTE — ED Notes (Signed)
Informed RN bed assigned 

## 2022-06-30 NOTE — Assessment & Plan Note (Signed)
Continue Synthroid °

## 2022-06-30 NOTE — Progress Notes (Signed)
PT Cancellation Note  Patient Details Name: [name] MRN: [id number] DOB: 07/30/1946   Cancelled Treatment:    Reason Eval/Treat Not Completed: Other (comment). Orders received and chart reviewed. Per attending MD via secure chat, requesting PT to hold until podiatry consult complete for recommendations on weightbearing precautions on R foot due to osteomyelitis. Will re-attempt as able and medically appropriate.   Salem Caster. Fairly IV, PT, DPT Physical Therapist- [facility] 06/30/2022, 12:57 PM

## 2022-06-30 NOTE — Assessment & Plan Note (Addendum)
Acute on chronic respiratory failure with hypoxia  IV SoluMedrol, bronchodilator therapy with DuoNebs and mucolytic therapy with Mucinex.  Supplemental O2

## 2022-06-30 NOTE — Progress Notes (Addendum)
Pharmacy Antibiotic Note  [name] is a 76 y.o. female with history of CAD, COPD, HTN admitted on 06/29/2022 with  osteomyelitis of fifth toe of right foot . XRay and MRI of right foot positive for acute osteomyelitis of the fifth toe. 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 Pharmacy has been consulted for Vancomycin dosing.  Plan:  Day 2 of abx.  Pt given Vancomycin 1000 mg once. Vancomycin 750 mg IV Q 24 hrs. Goal AUC 400-550. Expected AUC: 448.5  SCr used: 0.87, TBW 53.4 kg < IBW 54.7 kg Check vancomycin levels after 4th or 5th dose   Continue Zosyn 3.375 [place] will continue to follow and will adjust abx dosing whenever warranted.  Weight: 53.4 kg (117 lb 11.6 oz)  Temp (24hrs), Avg:98.2 F (36.8 C), Min:98 F (36.7 C), Max:98.3 F (36.8 C)   Recent Labs  Lab 06/29/22 1953 06/29/22 2118 06/29/22 2156 06/30/22 0526  WBC  --  13.3*  --  6.0  CREATININE  --  1.17*  --  0.87  LATICACIDVEN 2.5*  --  1.3  --      Estimated Creatinine Clearance: 47.1 mL/min (by C-G formula based on SCr of 0.87 mg/dL).    No Known Allergies  Antimicrobials this admission:  8/11 Cefepime >> x 1 dose 8/10 Flagyl >> x 1 dose 8/10 Vancomycin >>  8/11 Zosyn >>  Microbiology results: 8/10 BCx: NGTD 8/10 UCx: Pending   Thank you for allowing pharmacy to be a part of this patient's care.  Rodolph Bong, PharmD Candidate 06/30/2022 9:55 AM

## 2022-06-30 NOTE — Assessment & Plan Note (Signed)
Continue statin therapy.

## 2022-07-01 DIAGNOSIS — J441 Chronic obstructive pulmonary disease with (acute) exacerbation: Secondary | ICD-10-CM | POA: Diagnosis not present

## 2022-07-01 LAB — BASIC METABOLIC PANEL
Anion gap: 4 — ABNORMAL LOW (ref 5–15)
BUN: 20 mg/dL (ref 8–23)
CO2: 24 mmol/L (ref 22–32)
Calcium: 8.1 mg/dL — ABNORMAL LOW (ref 8.9–10.3)
Chloride: 111 mmol/L (ref 98–111)
Creatinine, Ser: 0.78 mg/dL (ref 0.44–1.00)
GFR, Estimated: >60 mL/min (ref >60)
Glucose, Bld: 122 mg/dL — ABNORMAL HIGH (ref 70–99)
Potassium: 4.9 mmol/L (ref 3.5–5.1)
Sodium: 139 mmol/L (ref 135–145)

## 2022-07-01 LAB — CBC
HCT: 33.2 % — ABNORMAL LOW (ref 36.0–46.0)
Hemoglobin: 10.7 g/dL — ABNORMAL LOW (ref 12.0–15.0)
MCH: 28.5 pg (ref 26.0–34.0)
MCHC: 32.2 g/dL (ref 30.0–36.0)
MCV: 88.3 fL (ref 80.0–100.0)
Platelets: 228 10*3/uL (ref 150–400)
RBC: 3.76 MIL/uL — ABNORMAL LOW (ref 3.87–5.11)
RDW: 13.7 % (ref 11.5–15.5)
WBC: 11.6 10*3/uL — ABNORMAL HIGH (ref 4.0–10.5)
nRBC: 0 % (ref 0.0–0.2)

## 2022-07-01 LAB — TSH: TSH: 0.229 u[IU]/mL — ABNORMAL LOW (ref 0.350–4.500)

## 2022-07-01 LAB — URINE CULTURE: Culture: NO GROWTH

## 2022-07-01 LAB — MAGNESIUM: Magnesium: 2.2 mg/dL (ref 1.7–2.4)

## 2022-07-01 LAB — T4, FREE: Free T4: 0.97 ng/dL (ref 0.61–1.12)

## 2022-07-01 LAB — PROCALCITONIN: Procalcitonin: <0.1 ng/mL

## 2022-07-01 MED ORDER — HYDRALAZINE HCL 20 MG/ML IJ SOLN: 10.0000 mg | Freq: Four times a day (QID) | INTRAMUSCULAR | Status: DC | PRN

## 2022-07-01 MED ORDER — HYDROCODONE-ACETAMINOPHEN 10-325 MG PO TABS
1.0000 | ORAL_TABLET | ORAL | Status: DC | PRN
  Administered 2022-07-01 – 2022-07-06 (×21): 1 via ORAL
  Filled 2022-07-01 (×22): qty 1

## 2022-07-01 MED ORDER — METOPROLOL TARTRATE 25 MG PO TABS
12.5000 mg | ORAL_TABLET | Freq: Two times a day (BID) | ORAL | Status: DC
  Administered 2022-07-01: 12.5 mg via ORAL
  Filled 2022-07-01: qty 1

## 2022-07-01 MED ORDER — POLYETHYLENE GLYCOL 3350 17 G PO PACK
17.0000 g | PACK | Freq: Every day | ORAL | Status: DC
  Administered 2022-07-01 – 2022-07-08 (×7): 17 g via ORAL
  Filled 2022-07-01 (×6): qty 1

## 2022-07-01 MED ORDER — AMLODIPINE BESYLATE 5 MG PO TABS
5.0000 mg | ORAL_TABLET | Freq: Every day | ORAL | Status: DC
  Administered 2022-07-01 – 2022-07-08 (×7): 5 mg via ORAL
  Filled 2022-07-01 (×7): qty 1

## 2022-07-01 MED ORDER — LEVOTHYROXINE SODIUM 88 MCG PO TABS: 88.0000 ug | ORAL_TABLET | Freq: Every day | ORAL | Status: DC

## 2022-07-01 MED ORDER — ALPRAZOLAM 0.5 MG PO TABS
1.0000 mg | ORAL_TABLET | Freq: Three times a day (TID) | ORAL | Status: DC
  Administered 2022-07-01 – 2022-07-08 (×20): 1 mg via ORAL
  Filled 2022-07-01 (×20): qty 2

## 2022-07-01 NOTE — Progress Notes (Signed)
PHARMACY - PHYSICIAN COMMUNICATION CRITICAL VALUE ALERT - BLOOD CULTURE IDENTIFICATION (BCID)  [name] is an 76 y.o. female who presented to [facility] on 06/29/2022 with a chief complaint of SOB and AMS. Patient found to have sepsis secondary to osteomyelitis of fifth toe of right foot.  Assessment:  1 of 3 bottles, growing Gram-positive rods. No BCID results.  Name of physician (or Provider) Contacted: Emeterio Reeve  Current antibiotics: Vancomycin 750 mg IV q24H, Zosyn 3.375 g IV q8H  Changes to prescribed antibiotics recommended:  No changes recommended.     Dara Hoyer, PharmD PGY-1 Pharmacy Resident 07/01/2022 1:05 PM

## 2022-07-01 NOTE — Progress Notes (Signed)
PROGRESS NOTE    [facility]   RAQ:[id number] DOB: 06/28/1946  DOA: 06/29/2022 Date of Service: 07/01/22 PCP: Albina Billet, MD     Brief Narrative / Hospital Course:  [name] is a 76 y.o. Caucasian female with medical history significant for CAD, COPD, depression, dyslipidemia, hypertension and hypothyroidism, who presented to the ER 06/29/2022 with acute onset of worsening dyspnea with associated cough productive of yellow sputum as well as wheezing over the last 4 to 5 days.  Also complaining of foot pain and ulceration, 08/10: Tachycardic 126, tachypneic 26, on baseline 2 L [place] at home and was requiring 4 L O2 [place].  Hypokalemia 2.8, creatinine 1.17, troponins flat at 13 and 14, lactic acid 2.5 trended down to 1.3, procalcitonin less than 0.1, leukocytosis 13.3.  CXR concerning for bronchial thickening due to acute bronchitis/COPD exacerbation.  No DVT on ultrasound, CT head no concerns, XR left foot no acute concerns, XR right foot concerning for osteomyelitis.  Patient was started on IV antibiotics with cefepime, Flagyl, vancomycin.  500 mL NS bolus.  IV magnesium sulfate, Solu-Medrol, Xanax.  Admitted to hospitalist service with COPD exacerbation with acute on chronic hypoxic respiratory failure, sepsis due to osteomyelitis fifth toe of right foot to continue on vancomycin and Zosyn and podiatry consult obtained, electrolyte derangement including hypokalemia 08/11: MRI R foot (+)  osteomyelitis of the distal phalanx of the fifth toe. Osteonecrosis involving the second, fourth, and fifth metatarsal heads. Bone marrow edema distal phalanx of the great toe posttraumatic vs early acute osteomyelitis.  Podiatry saw patient: Continue IV ABX, weightbearing as tolerated, ABI ordered, recommending vascular consult and pending vascular studies may need to consider amputation after possible angiography versus sooner if necrosis/sepsis 08/12: Consult placed to vascular surgery.  Leukocytosis likely  due to steroids.  SPO2 at goal on 2 to 4 L    Consultants:  Podiatry Vascular surgery   Procedures: none    Subjective: Patient reports continued foot pain, no fever/chills, no shortness of breath.     ASSESSMENT & PLAN:   Principal Problem:   COPD exacerbation ([place]) Active Problems:   Acute on chronic respiratory failure with hypoxia (HCC)   Osteomyelitis of fifth toe of right foot (HCC)   Hypokalemia   Hypomagnesemia   Essential hypertension   Hypothyroidism   Dyslipidemia   Anxiety and depression   COPD exacerbation (HCC) Acute on chronic respiratory failure with hypoxia IV SoluMedrol, bronchodilator therapy with DuoNebs and mucolytic therapy with Mucinex. Supplemental O2   Osteomyelitis of fifth toe of right foot (HCC) (+) sepsis POA as manifested by leukocytosis, tachycardia and tachypnea. MRI results as per hospital course  IV vancomycin and Zosyn. Pain management will be provided. Podiatry consult   Hypothyroidism - We will continue Synthroid.  Hypomagnesemia - Magnesium will be replaced  Hypokalemia - Potassium will be replaced.  Essential hypertension - We will continue antihypertensives  Anxiety and depression - We will continue Xanax and Wellbutrin XL as well as Lexapro.  Dyslipidemia - We will continue statin therapy.    DVT prophylaxis: lovenox Code Status: full Family Communication: none at this time  Disposition Plan / TOC needs: Pending clinical course, if needing amputation may need SNF placement for rehab Barriers to discharge / significant pending items: IV antibiotics for osteomyelitis/sepsis, podiatry & vascular surgery recommendations             Objective: Vitals:   07/01/22 0905 07/01/22 0910 07/01/22 1111 07/01/22 1324  BP: (!) 193/93 Marland Kitchen)  188/90 (!) 156/88   Pulse: 97  99   Resp: (!) 22  20   Temp: 97.7 F (36.5 C)  98.7 F (37.1 C)   TempSrc:   Oral   SpO2: 99%  100% 98%  Weight:         Intake/Output Summary (Last 24 hours) at 07/01/2022 1354 Last data filed at 07/01/2022 1042 Gross per 24 hour  Intake 2667.76 ml  Output 1050 ml  Net 1617.76 ml   Filed Weights   06/29/22 1947  Weight: 53.4 kg    Examination:  Constitutional:  VS as above General Appearance: alert, NAD, thin, hard of hearing  Neck: No masses, trachea midline Respiratory: Normal respiratory effort Diminished breath sounds in all lung fields +diffuse wheeze No rhonchi No rales Cardiovascular: S1/S2 normal No lower extremity edema Gastrointestinal: No tenderness Musculoskeletal:  No clubbing/cyanosis of digits Grossly symmetrical movement in all extremities See photos below  Neurological: No cranial nerve deficit on limited exam Alert Psychiatric: Normal judgment/insight Normal mood and affect           Scheduled Medications:   amLODipine  5 mg Oral Daily   vitamin C  500 mg Oral BID   atorvastatin  10 mg Oral Daily   enoxaparin (LOVENOX) injection  40 mg Subcutaneous Q24H   feeding supplement  237 mL Oral TID BM   ipratropium-albuterol  3 mL Nebulization QID   montelukast  10 mg Oral Daily   multivitamin with minerals  1 tablet Oral Daily   polyethylene glycol  17 g Oral Daily   predniSONE  40 mg Oral Q breakfast   zinc sulfate  220 mg Oral Daily    Continuous Infusions:  sodium chloride 10 mL/hr at 07/01/22 1042   0.9 % NaCl with KCl 40 mEq / L 100 mL/hr at 07/01/22 1042   piperacillin-tazobactam (ZOSYN)  IV 3.375 g (07/01/22 1316)   vancomycin Stopped (07/01/22 0008)    PRN Medications:  sodium chloride, acetaminophen **OR** acetaminophen, ALPRAZolam, hydrALAZINE, HYDROcodone-acetaminophen, ketorolac, LORazepam, magnesium hydroxide, ondansetron **OR** ondansetron (ZOFRAN) IV, traZODone  Antimicrobials:  Anti-infectives (From admission, onward)    Start     Dose/Rate Route Frequency Ordered Stop   07/01/22 1200  vancomycin (VANCOREADY) IVPB 1250 mg/250  mL  Status:  Discontinued        1,250 mg 166.7 mL/hr over 90 Minutes Intravenous Every 48 hours 06/30/22 0224 06/30/22 1100   06/30/22 2200  vancomycin (VANCOREADY) IVPB 750 mg/150 mL        750 mg 150 mL/hr over 60 Minutes Intravenous Every 24 hours 06/30/22 1100     06/30/22 0600  piperacillin-tazobactam (ZOSYN) IVPB 3.375 g        3.375 g 12.5 mL/hr over 240 Minutes Intravenous Every 8 hours 06/30/22 0227     06/30/22 0215  vancomycin (VANCOCIN) IVPB 1000 mg/200 mL premix  Status:  Discontinued        1,000 mg 200 mL/hr over 60 Minutes Intravenous  Once 06/30/22 0210 06/30/22 0223   06/30/22 0215  piperacillin-tazobactam (ZOSYN) IVPB 3.375 g  Status:  Discontinued        3.375 g 100 mL/hr over 30 Minutes Intravenous Every 6 hours 06/30/22 0210 06/30/22 0226   06/29/22 2245  ceFEPIme (MAXIPIME) 2 g in sodium chloride 0.9 % 100 mL IVPB        2 g 200 mL/hr over 30 Minutes Intravenous  Once 06/29/22 2242 06/30/22 0159   06/29/22 2245  metroNIDAZOLE (FLAGYL) IVPB 500 mg  500 mg 100 mL/hr over 60 Minutes Intravenous  Once 06/29/22 2242 06/30/22 0042   06/29/22 2245  vancomycin (VANCOCIN) IVPB 1000 mg/200 mL premix        1,000 mg 200 mL/hr over 60 Minutes Intravenous  Once 06/29/22 2242 06/30/22 0120       Data Reviewed: I have personally reviewed following labs and imaging studies  CBC: Recent Labs  Lab 06/29/22 2118 06/30/22 0526 07/01/22 0421  WBC 13.3* 6.0 11.6*  NEUTROABS 10.7*  --   --   HGB 12.1 10.7* 10.7*  HCT 37.2 33.2* 33.2*  MCV 87.1 86.2 88.3  PLT 260 240 403   Basic Metabolic Panel: Recent Labs  Lab 06/29/22 2118 06/30/22 0526 07/01/22 0421  NA 139 139 139  K 2.8* 4.0 4.9  CL 104 108 111  CO2 '26 25 24  '$ GLUCOSE 110* 146* 122*  BUN '20 20 20  '$ CREATININE 1.17* 0.87 0.78  CALCIUM 8.7* 8.1* 8.1*  MG 1.6*  --  2.2   GFR: Estimated Creatinine Clearance: 51.2 mL/min (by C-G formula based on SCr of 0.78 mg/dL). Liver Function Tests: Recent Labs   Lab 06/29/22 2118  AST 26  ALT 20  ALKPHOS 73  BILITOT 0.8  PROT 6.7  ALBUMIN 3.5   No results for input(s): "LIPASE", "AMYLASE" in the last 168 hours. No results for input(s): "AMMONIA" in the last 168 hours. Coagulation Profile: Recent Labs  Lab 06/30/22 0526  INR 1.1   Cardiac Enzymes: No results for input(s): "CKTOTAL", "CKMB", "CKMBINDEX", "TROPONINI" in the last 168 hours. BNP (last 3 results) No results for input(s): "PROBNP" in the last 8760 hours. HbA1C: No results for input(s): "HGBA1C" in the last 72 hours. CBG: No results for input(s): "GLUCAP" in the last 168 hours. Lipid Profile: No results for input(s): "CHOL", "HDL", "LDLCALC", "TRIG", "CHOLHDL", "LDLDIRECT" in the last 72 hours. Thyroid Function Tests: Recent Labs    07/01/22 0421  TSH 0.229*  FREET4 0.97   Anemia Panel: No results for input(s): "VITAMINB12", "FOLATE", "FERRITIN", "TIBC", "IRON", "RETICCTPCT" in the last 72 hours. Urine analysis:    Component Value Date/Time   COLORURINE YELLOW (A) 06/29/2022 2358   APPEARANCEUR HAZY (A) 06/29/2022 2358   LABSPEC 1.017 06/29/2022 2358   PHURINE 5.0 06/29/2022 2358   GLUCOSEU 50 (A) 06/29/2022 2358   HGBUR MODERATE (A) 06/29/2022 2358   BILIRUBINUR NEGATIVE 06/29/2022 2358   KETONESUR 5 (A) 06/29/2022 2358   PROTEINUR 30 (A) 06/29/2022 2358   NITRITE NEGATIVE 06/29/2022 2358   LEUKOCYTESUR NEGATIVE 06/29/2022 2358   Sepsis Labs: '@LABRCNTIP'$ (procalcitonin:4,lacticidven:4)  Recent Results (from the past 240 hour(s))  Blood culture (routine x 2)     Status: None (Preliminary result)   Collection Time: 06/29/22  7:53 PM   Specimen: BLOOD  Result Value Ref Range Status   Specimen Description BLOOD LEFT FOREARM  Final   Special Requests IN PEDIATRIC BOTTLE Blood Culture adequate volume  Final   Culture  Setup Time   Final    GRAM POSITIVE RODS IN PEDIATRIC BOTTLE CRITICAL RESULT CALLED TO, READ BACK BY AND VERIFIED WITH: WILL ANDERSON ON  07/01/22 AT 1249 QSD Performed at [facility], [address] [place],  [address]    Culture GRAM POSITIVE RODS  Final   Report Status PENDING  Incomplete  SARS Coronavirus 2 by RT PCR (hospital order, performed in [facility] hospital lab) *cepheid single result test* Anterior Nasal Swab     Status: None   Collection Time: 06/29/22  7:53 PM   Specimen: Anterior Nasal Swab  Result Value Ref Range Status   SARS Coronavirus 2 by RT PCR NEGATIVE NEGATIVE Final    Comment: (NOTE) SARS-CoV-2 target nucleic acids are NOT DETECTED.  The SARS-CoV-2 RNA is generally detectable in upper and lower respiratory specimens during the acute phase of infection. The lowest concentration of SARS-CoV-2 viral copies this assay can detect is 250 copies / mL. A negative result does not preclude SARS-CoV-2 infection and should not be used as the sole basis for treatment or other patient management decisions.  A negative result may occur with improper specimen collection / handling, submission of specimen other than nasopharyngeal swab, presence of viral mutation(s) within the areas targeted by this assay, and inadequate number of viral copies (<250 copies / mL). A negative result must be combined with clinical observations, patient history, and epidemiological information.  Fact Sheet for Patients:   https://www.patel.info/  Fact Sheet for Healthcare Providers: https://hall.com/  This test is not yet approved or  cleared by the [place] FDA and has been authorized for detection and/or diagnosis of SARS-CoV-2 by FDA under an Emergency Use Authorization (EUA).  This EUA will remain in effect (meaning this test can be used) for the duration of the COVID-19 declaration under Section 564(b)(1) of the Act, 21 U.S.C. section 360bbb-3(b)(1), unless the authorization is terminated or revoked sooner.  Performed at [facility], [address] [place], [place] [address]   Culture, blood (Routine X 2) w Reflex to ID Panel     Status: None (Preliminary result)   Collection Time: 06/29/22  9:20 PM   Specimen: BLOOD  Result Value Ref Range Status   Specimen Description BLOOD BLOOD LEFT FOREARM  Final   Special Requests   Final    BOTTLES DRAWN AEROBIC AND ANAEROBIC Blood Culture adequate volume   Culture   Final    NO GROWTH 2 DAYS Performed at [facility], [address], [place], [place] [address]    Report Status PENDING  Incomplete  Urine Culture     Status: None   Collection Time: 06/29/22 11:21 PM   Specimen: Urine, Random  Result Value Ref Range Status   Specimen Description   Final    URINE, RANDOM Performed at [facility], [address], [place], [place] [address]    Special Requests   Final    NONE Performed at [facility], [address], [place], [place] [address]    Culture   Final    NO GROWTH Performed at [facility], [place] [address], [place], [place] [address]    Report Status 07/01/2022 FINAL  Final         Radiology Studies: DG Foot Complete Left  Result Date: 06/29/2022 CLINICAL DATA:  toe discolation EXAM: LEFT FOOT - COMPLETE 3+ VIEW COMPARISON:  None Available. FINDINGS: No cortical erosion or destruction. Cortical irregularity along the head of the second digit proximal phalanx likely old healed fracture. There is no evidence of fracture or dislocation. There is no evidence of arthropathy or other focal bone abnormality. Soft tissues are unremarkable. IMPRESSION: 1. No cortical erosion or destruction to suggest osteomyelitis of the left foot. 2. No definite acute displaced fracture or dislocation. Electronically Signed   By: Iven Finn M.D.   On: 06/29/2022 23:30   DG Foot Complete Right  Result Date: 06/29/2022 CLINICAL DATA:  toe discolation EXAM: RIGHT FOOT COMPLETE - 3+ VIEW COMPARISON:  X-ray right foot  05/07/2022 FINDINGS: Query  interval development of cortical erosion of the fifth digit distal phalanx. Query prior second, third, fourth toe phalangeal amputation. Chronic cortical irregularity/nonaggressive periosteal reaction along the fourth metatarsal body and neck. No cortical erosion or destruction. There is no evidence of fracture or dislocation. Subcutaneus soft tissue edema. IMPRESSION: Query interval development of cortical erosion of the fifth digit distal phalanx. Consider MRI (with contrast if GFR greater than 30) for further evaluation of possible underlying osteomyelitis. Electronically Signed   By: Iven Finn M.D.   On: 06/29/2022 23:28   CT HEAD WO CONTRAST (5MM)  Result Date: 06/29/2022 CLINICAL DATA:  Mental status change, unknown cause EXAM: CT HEAD WITHOUT CONTRAST TECHNIQUE: Contiguous axial images were obtained from the base of the skull through the vertex without intravenous contrast. RADIATION DOSE REDUCTION: This exam was performed according to the departmental dose-optimization program which includes automated exposure control, adjustment of the mA and/or kV according to patient size and/or use of iterative reconstruction technique. COMPARISON:  Head CT 05/07/2022 FINDINGS: Brain: No intracranial hemorrhage, mass effect, or midline shift. Stable degree of atrophy. No hydrocephalus. The basilar cisterns are patent. Stable periventricular and deep white matter hypodensity typical of chronic small vessel ischemic change. No evidence of territorial infarct or acute ischemia. No extra-axial or intracranial fluid collection. Vascular: Atherosclerosis of skullbase vasculature without hyperdense vessel or abnormal calcification. Skull: No fracture or focal lesion. Sinuses/Orbits: No acute findings. The paranasal sinuses are clear. No mastoid effusion. Bilateral cataract resection. Other: None. IMPRESSION: 1. No acute intracranial abnormality. 2. Stable atrophy and chronic small vessel ischemic change.  Electronically Signed   By: Keith Rake M.D.   On: 06/29/2022 22:08   US Venous Img Lower Unilateral Right  Result Date: 06/29/2022 CLINICAL DATA:  Initial evaluation for acute leg swelling. EXAM: RIGHT LOWER EXTREMITY VENOUS DOPPLER ULTRASOUND TECHNIQUE: Gray-scale sonography with graded compression, as well as color Doppler and duplex ultrasound were performed to evaluate the lower extremity deep venous systems from the level of the common femoral vein and including the common femoral, femoral, profunda femoral, popliteal and calf veins including the posterior tibial, peroneal and gastrocnemius veins when visible. The superficial great saphenous vein was also interrogated. Spectral Doppler was utilized to evaluate flow at rest and with distal augmentation maneuvers in the common femoral, femoral and popliteal veins. COMPARISON:  None Available. FINDINGS: Contralateral Common Femoral Vein: Respiratory phasicity is normal and symmetric with the symptomatic side. No evidence of thrombus. Normal compressibility. Common Femoral Vein: No evidence of thrombus. Normal compressibility, respiratory phasicity and response to augmentation. Saphenofemoral Junction: No evidence of thrombus. Normal compressibility and flow on color Doppler imaging. Profunda Femoral Vein: No evidence of thrombus. Normal compressibility and flow on color Doppler imaging. Femoral Vein: No evidence of thrombus. Normal compressibility, respiratory phasicity and response to augmentation. Popliteal Vein: No evidence of thrombus. Normal compressibility, respiratory phasicity and response to augmentation. Calf Veins: No evidence of thrombus. Normal compressibility and flow on color Doppler imaging. Superficial Great Saphenous Vein: No evidence of thrombus. Normal compressibility. Venous Reflux:  None. Other Findings:  None. IMPRESSION: No evidence of deep venous thrombosis. Electronically Signed   By: Jeannine Boga M.D.   On: 06/29/2022  21:44   DG Chest Portable 1 View  Result Date: 06/29/2022 CLINICAL DATA:  Shortness of breath. EXAM: PORTABLE CHEST 1 VIEW COMPARISON:  Radiograph 05/07/2022 FINDINGS: The lungs are hyperinflated. There is increased bronchial thickening from prior exam. The heart is normal in size with normal mediastinal  contours. Aortic atherosclerosis. No acute airspace disease. No pneumothorax, large pleural effusion, or evidence of pulmonary edema. Chronic change of the right shoulder. IMPRESSION: Increased bronchial thickening from prior exam, may be acute bronchitis or COPD exacerbation. Electronically Signed   By: Keith Rake M.D.   On: 06/29/2022 20:08            LOS: 2 days       Emeterio Reeve, DO [facility] 07/01/2022, 1:54 PM   Staff may message me via secure chat in [place]  but this may not receive immediate response,  please page for urgent matters!  If 7PM-7AM, please contact night-coverage [email or web address]  Dictation software was used to generate the above note. Typos may occur and escape review, as with typed/written notes. Please contact Dr Sheppard Coil directly for clarity if needed.

## 2022-07-01 NOTE — H&P (View-Only) (Signed)
[facility] Vascular Consult Note  MRN : [id number]  [name] is a 76 y.o. (Jul 02, 1946) female who presents with chief complaint of  Chief Complaint  Patient presents with   Shortness of Breath   Altered Mental Status  .  History of Present Illness: Patient known to service history of RIGHT SFA angioplasty and stenting; anterior tibial angioplasty for gangrene of the 4th toe in December 2022. Patient required additional follow up for evaluation and interventions and was lost to follow up. 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   Current Facility-Administered Medications  Medication Dose Route Frequency Provider Last Rate Last Admin   0.9 %  sodium chloride infusion   Intravenous PRN Mansy, Jan A, MD 10 mL/hr at 06/30/22 2303 New Bag at 06/30/22 2303   0.9 % NaCl with KCl 40 mEq / L  infusion   Intravenous Continuous Mansy, Jan A, MD 100 mL/hr at 07/01/22 0615 Infusion Verify at 07/01/22 0615   acetaminophen (TYLENOL) tablet 650 mg  650 mg Oral Q6H PRN Oswald Hillock, RPH   650 mg at 06/30/22 1130   Or   acetaminophen (TYLENOL) suppository 650 mg  650 mg Rectal Q6H PRN Oswald Hillock, RPH       ALPRAZolam Duanne Moron) tablet 1 mg  1 mg Oral TID PRN Mansy, Jan A, MD   1 mg at 07/01/22 0754   amLODipine (NORVASC) tablet 5 mg  5 mg Oral Daily Emeterio Reeve, DO   5 mg at 07/01/22 4132   ascorbic acid (VITAMIN C) tablet 500 mg  500 mg Oral BID Emeterio Reeve, DO   500 mg at 07/01/22 0755   atorvastatin (LIPITOR) tablet 10 mg  10 mg Oral Daily Mansy, Jan A, MD   10 mg at 07/01/22 0755   enoxaparin (LOVENOX) injection 40 mg  40 mg Subcutaneous Q24H Mansy, Jan A, MD   40 mg at 07/01/22 0756   feeding supplement (ENSURE ENLIVE / ENSURE PLUS) liquid 237 mL  237 mL Oral TID BM Emeterio Reeve, DO       hydrALAZINE (APRESOLINE) injection 10 mg  10 mg Intravenous Q6H PRN Emeterio Reeve, DO        ipratropium-albuterol (DUONEB) 0.5-2.5 (3) MG/3ML nebulizer solution 3 mL  3 mL Nebulization QID Mansy, Jan A, MD   3 mL at 07/01/22 0814   ketorolac (TORADOL) 15 MG/ML injection 15 mg  15 mg Intravenous Q6H PRN Emeterio Reeve, DO   15 mg at 07/01/22 4401   LORazepam (ATIVAN) injection 1 mg  1 mg Intravenous Once PRN Emeterio Reeve, DO       magnesium hydroxide (MILK OF MAGNESIA) suspension 30 mL  30 mL Oral Daily PRN Mansy, Jan A, MD       montelukast (SINGULAIR) tablet 10 mg  10 mg Oral Daily Mansy, Jan A, MD   10 mg at 07/01/22 0756   multivitamin with minerals tablet 1 tablet  1 tablet Oral Daily Emeterio Reeve, DO   1 tablet at 07/01/22 0754   ondansetron (ZOFRAN) tablet 4 mg  4 mg Oral Q6H PRN Mansy, Jan A, MD       Or   ondansetron [facility]) injection 4 mg  4 mg Intravenous Q6H PRN Mansy, Jan A, MD       oxyCODONE (Oxy IR/ROXICODONE) immediate release tablet 10 mg  10 mg Oral Q6H PRN Emeterio Reeve, DO   10 mg at 07/01/22 [phone]  oxyCODONE (Oxy IR/ROXICODONE) immediate release tablet 5 mg  5 mg Oral Q6H PRN Emeterio Reeve, DO   5 mg at 06/30/22 1333   piperacillin-tazobactam (ZOSYN) IVPB 3.375 g  3.375 g Intravenous Q8H Renda Rolls, RPH 12.5 mL/hr at 07/01/22 0615 Infusion Verify at 07/01/22 0615   polyethylene glycol (MIRALAX / GLYCOLAX) packet 17 g  17 g Oral Daily Emeterio Reeve, DO   17 g at 07/01/22 0912   predniSONE (DELTASONE) tablet 40 mg  40 mg Oral Q breakfast Mansy, Jan A, MD   40 mg at 07/01/22 0755   traZODone (DESYREL) tablet 25 mg  25 mg Oral QHS PRN Mansy, Jan A, MD   25 mg at 06/30/22 0245   vancomycin (VANCOREADY) IVPB 750 mg/150 mL  750 mg Intravenous Q24H Oswald Hillock, [facility]   Stopped at 07/01/22 0008   zinc sulfate capsule 220 mg  220 mg Oral Daily Emeterio Reeve, DO   220 mg at 07/01/22 6073    Past Medical History:  Diagnosis Date   Anxiety    CAD (coronary artery disease)    Carpal tunnel syndrome    Cataract    Complication of  anesthesia    has woken up at times   COPD (chronic obstructive pulmonary disease) (HCC)    CRP elevated 09/14/2015   Depression    Difficult intubation    Dyslipidemia    Dyspnea    DOE   Dysrhythmia    hx palpatations   Elevated sedimentation rate 09/14/2015   Esophageal spasm    Gastrointestinal parasites    GERD (gastroesophageal reflux disease)    HCAP (healthcare-associated pneumonia) 10/22/2018   Hiatal hernia    History of peptic ulcer disease    Hypertension    Hyperthyroidism    Hypothyroidism    Low magnesium levels 09/14/2015   Pelvic fracture ([place]) 2008   fall from riding a horse   Peripheral vascular disease ([place])    Reflux    Rotator cuff injury    Sepsis ([place]) 10/22/2018   Stenosis, spinal, lumbar     Past Surgical History:  Procedure Laterality Date   AMPUTATION TOE Right 12/26/2020   Procedure: AMPUTATION TOE-3rd Toes;  Surgeon: Samara Deist, DPM;  Location: [facility];  Service: Podiatry;  Laterality: Right;   AMPUTATION TOE Right 04/22/2021   Procedure: AMPUTATION TOE- RIGHT 2ND;  Surgeon: Samara Deist, DPM;  Location: [facility];  Service: Podiatry;  Laterality: Right;   AMPUTATION TOE Right 10/21/2021   Procedure: AMPUTATION TOE;  Surgeon: Caroline More, DPM;  Location: [facility];  Service: Podiatry;  Laterality: Right;   AMPUTATION TOE Right 11/23/2021   Procedure: AMPUTATION TOE;  Surgeon: Sharlotte Alamo, DPM;  Location: [facility];  Service: Podiatry;  Laterality: Right;   APPENDECTOMY     BACK SURGERY     CERVICAL FUSION   CARPAL TUNNEL RELEASE     CATARACT EXTRACTION W/PHACO Right 08/07/2017   Procedure: CATARACT EXTRACTION PHACO AND INTRAOCULAR LENS PLACEMENT ([place]);  Surgeon: Birder Robson, MD;  Location: [facility];  Service: Ophthalmology;  Laterality: Right;  [place] 00:52.0 AP% 16.8 CDE 8.74 Fluid Pack Lot # [id number]   CATARACT EXTRACTION W/PHACO Left 09/04/2017   Procedure: CATARACT EXTRACTION PHACO AND INTRAOCULAR LENS PLACEMENT ([place]);  Surgeon:  Birder Robson, MD;  Location: [facility];  Service: Ophthalmology;  Laterality: Left;  [place] 00:34 AP% 17.0 CDE 5.80 Fluid pack lot # [id number]   CESAREAN SECTION     CHOLECYSTECTOMY     FRACTURE SURGERY  HIP SURGERY     INTRAMEDULLARY (IM) NAIL INTERTROCHANTERIC Right 10/01/2016   Procedure: INTRAMEDULLARY (IM) NAIL INTERTROCHANTRIC;  Surgeon: Corky Mull, MD;  Location: [facility];  Service: Orthopedics;  Laterality: Right;   INTRAMEDULLARY (IM) NAIL INTERTROCHANTERIC Left 01/14/2021   Procedure: INTRAMEDULLARY (IM) NAIL INTERTROCHANTRIC;  Surgeon: Lovell Sheehan, MD;  Location: [facility];  Service: Orthopedics;  Laterality: Left;   KYPHOPLASTY N/A 08/20/2018   Procedure: [id number];  Surgeon: Hessie Knows, MD;  Location: [facility];  Service: Orthopedics;  Laterality: N/A;   LOWER EXTREMITY ANGIOGRAPHY Right 12/18/2019   Procedure: LOWER EXTREMITY ANGIOGRAPHY;  Surgeon: Algernon Huxley, MD;  Location: [place] [facility];  Service: Cardiovascular;  Laterality: Right;   LOWER EXTREMITY ANGIOGRAPHY Right 12/02/2020   Procedure: LOWER EXTREMITY ANGIOGRAPHY;  Surgeon: Algernon Huxley, MD;  Location: [place] [facility];  Service: Cardiovascular;  Laterality: Right;   LOWER EXTREMITY ANGIOGRAPHY Right 12/23/2020   Procedure: Lower Extremity Angiography;  Surgeon: Algernon Huxley, MD;  Location: [place] [facility];  Service: Cardiovascular;  Laterality: Right;   LOWER EXTREMITY ANGIOGRAPHY Right 12/24/2020   Procedure: Lower Extremity Angiography;  Surgeon: Algernon Huxley, MD;  Location: [place] [facility];  Service: Cardiovascular;  Laterality: Right;   LOWER EXTREMITY ANGIOGRAPHY Right 10/20/2021   Procedure: Lower Extremity Angiography;  Surgeon: Algernon Huxley, MD;  Location: [place] [facility];  Service: Cardiovascular;  Laterality: Right;   NECK SURGERY     NOSE SURGERY     ROTATOR CUFF REPAIR     x2   SHOULDER ARTHROSCOPY  12/07/2011   Procedure: ARTHROSCOPY SHOULDER;  Surgeon: Ninetta Lights,  MD;  Location: [place];  Service: Orthopedics;  Laterality: Right;  Debridement Partial Cuff Tear, Release Coracoacromial Ligament   SHOULDER SURGERY  12/07/2011   right    Social History Social History   Tobacco Use   Smoking status: Some Days    Packs/day: 0.00    Types: Cigarettes   Smokeless tobacco: Never   Tobacco comments:    pt states 2 cigs per month   Substance Use Topics   Alcohol use: No   Drug use: No    Family History Family History  Problem Relation Age of Onset   Aneurysm Other     No Known Allergies   REVIEW OF SYSTEMS (Negative unless checked)  Constitutional: '[]'$ Weight loss  '[]'$ Fever  '[]'$ Chills Cardiac: '[]'$ Chest pain   '[]'$ Chest pressure   '[]'$ Palpitations   '[]'$ Shortness of breath when laying flat   '[]'$ Shortness of breath at rest   '[]'$ Shortness of breath with exertion. Vascular:  '[x]'$ Pain in legs with walking   '[x]'$ Pain in legs at rest   '[]'$ Pain in legs when laying flat   '[]'$ Claudication   '[]'$ Pain in feet when walking  '[x]'$ Pain in feet at rest  '[]'$ Pain in feet when laying flat   '[]'$ History of DVT   '[]'$ Phlebitis   '[]'$ Swelling in legs   '[]'$ Varicose veins   '[]'$ Non-healing ulcers Pulmonary:   '[]'$ Uses home oxygen   '[]'$ Productive cough   '[]'$ Hemoptysis   '[]'$ Wheeze  '[]'$ COPD   '[]'$ Asthma Neurologic:  '[]'$ Dizziness  '[]'$ Blackouts   '[]'$ Seizures   '[]'$ History of stroke   '[]'$ History of TIA  '[]'$ Aphasia   '[]'$ Temporary blindness   '[]'$ Dysphagia   '[]'$ Weakness or numbness in arms   '[]'$ Weakness or numbness in legs Musculoskeletal:  '[]'$ Arthritis   '[]'$ Joint swelling   '[]'$ Joint pain   '[]'$ Low back pain Hematologic:  '[]'$ Easy bruising  '[]'$ Easy bleeding   '[]'$ Hypercoagulable  state   '[]'$ Anemic  '[]'$ Hepatitis Gastrointestinal:  '[]'$ Blood in stool   '[]'$ Vomiting blood  '[]'$ Gastroesophageal reflux/heartburn   '[]'$ Difficulty swallowing. Genitourinary:  '[]'$ Chronic kidney disease   '[]'$ Difficult urination  '[]'$ Frequent urination  '[]'$ Burning with urination   '[]'$ Blood in urine Skin:  '[]'$ Rashes   '[x]'$ Ulcers   '[]'$ Wounds Psychological:  '[]'$ History of  anxiety   '[]'$  History of major depression.  Physical Examination  Vitals:   07/01/22 0329 07/01/22 0330 07/01/22 0814 07/01/22 0905  BP: (!) 184/96 (!) 179/86  (!) 193/93  Pulse: 93 95  97  Resp: 18   (!) 22  Temp: 97.8 F (36.6 C)   97.7 F (36.5 C)  TempSrc: Oral     SpO2: 100%  98% 99%  Weight:       Body mass index is 20.21 kg/m. Gen:  WD/WN, NAD Pulmonary:  Good air movement, respirations not labored, equal bilaterally.  Cardiac: RRR, normal S1, S2. Vascular:  Vessel Right Left  Radial Palpable Palpable  Ulnar Palpable Palpable  Brachial Palpable Palpable  Carotid Palpable, without bruit Palpable, without bruit  Aorta Not palpable N/A  Femoral Palpable Palpable  Popliteal Non palpable   PT    DP Non palpable    Gastrointestinal: soft, non-tender/non-distended. No guarding/reflex.  Musculoskeletal: M/S 5/5 throughout.  Extremities Right - foot cool, gangrene of RIGHT 5th toe, great toe cyanotic, +motor/+sensory Neurologic: Sensation grossly intact in extremities.  Symmetrical.  Speech is fluent. Motor exam as listed above.  Dermatologic:ulcer 5 toe Lymph : No Cervical, Axillary, or Inguinal lymphadenopathy.     CBC Lab Results  Component Value Date   WBC 11.6 (H) 07/01/2022   HGB 10.7 (L) 07/01/2022   HCT 33.2 (L) 07/01/2022   MCV 88.3 07/01/2022   PLT 228 07/01/2022    BMET    Component Value Date/Time   NA 139 07/01/2022 0421   NA 143 10/06/2019 1552   NA 138 02/10/2014 1335   K 4.9 07/01/2022 0421   K 4.5 02/10/2014 1335   CL 111 07/01/2022 0421   CL 106 02/10/2014 1335   CO2 24 07/01/2022 0421   CO2 28 02/10/2014 1335   GLUCOSE 122 (H) 07/01/2022 0421   GLUCOSE 99 02/10/2014 1335   BUN 20 07/01/2022 0421   BUN 7 (L) 10/06/2019 1552   BUN 6 (L) 02/10/2014 1335   CREATININE 0.78 07/01/2022 0421   CREATININE 0.89 02/10/2014 1335   CALCIUM 8.1 (L) 07/01/2022 0421   CALCIUM 8.7 02/10/2014 1335   GFRNONAA >60 07/01/2022 0421   GFRNONAA >60  02/10/2014 1335   GFRAA >60 12/18/2019 1012   GFRAA >60 02/10/2014 1335   Estimated Creatinine Clearance: 51.2 mL/min (by C-G formula based on SCr of 0.78 mg/dL).  COAG Lab Results  Component Value Date   INR 1.1 06/30/2022   INR 1.1 05/07/2022   INR 1.2 01/14/2021    Radiology MR FOOT RIGHT W WO CONTRAST  Result Date: 06/30/2022 CLINICAL DATA:  Discoloration of toes.  Abnormal x-ray EXAM: MRI OF THE RIGHT FOREFOOT WITHOUT AND WITH CONTRAST TECHNIQUE: Multiplanar, multisequence MR imaging of the right forefoot was performed before and after the administration of intravenous contrast. CONTRAST:  89m GADAVIST GADOBUTROL 1 MMOL/ML IV SOLN COMPARISON:  X-ray 06/29/2022 FINDINGS: Bones/Joint/Cartilage Status post second, third, and fourth toe amputations. There is bone marrow edema and enhancement within the distal phalanx of the fifth toe with confluent low T1 marrow signal change compatible with acute osteomyelitis. Bone marrow edema and mild enhancement is also present throughout  the distal phalanx of the great toe with some intermediate T1 bone marrow signal, which could be posttraumatic or represent early acute osteomyelitis. Changes of osteonecrosis involving the second, fourth, and fifth metatarsal heads. Osseous structures appear otherwise intact. No malalignment. Ligaments Intact Lisfranc ligament.  No acute collateral ligament injury. Muscles and Tendons Post amputation changes within the musculotendinous structures related to the second through fourth toes. Chronic fatty infiltration of the musculature. No tenosynovial fluid collection. Soft tissues Soft tissue edema of the fifth toe.  No fluid collections. IMPRESSION: 1. Acute osteomyelitis of the distal phalanx of the fifth toe. 2. Bone marrow edema and mild enhancement throughout the distal phalanx of the great toe, which could be posttraumatic or represent early acute osteomyelitis. 3. Status post second, third, and fourth toe amputations.  4. Changes of osteonecrosis involving the second, fourth, and fifth metatarsal heads. Electronically Signed   By: Davina Poke D.O.   On: 06/30/2022 10:24   DG Foot Complete Left  Result Date: 06/29/2022 CLINICAL DATA:  toe discolation EXAM: LEFT FOOT - COMPLETE 3+ VIEW COMPARISON:  None Available. FINDINGS: No cortical erosion or destruction. Cortical irregularity along the head of the second digit proximal phalanx likely old healed fracture. There is no evidence of fracture or dislocation. There is no evidence of arthropathy or other focal bone abnormality. Soft tissues are unremarkable. IMPRESSION: 1. No cortical erosion or destruction to suggest osteomyelitis of the left foot. 2. No definite acute displaced fracture or dislocation. Electronically Signed   By: Iven Finn M.D.   On: 06/29/2022 23:30   DG Foot Complete Right  Result Date: 06/29/2022 CLINICAL DATA:  toe discolation EXAM: RIGHT FOOT COMPLETE - 3+ VIEW COMPARISON:  X-ray right foot 05/07/2022 FINDINGS: Query interval development of cortical erosion of the fifth digit distal phalanx. Query prior second, third, fourth toe phalangeal amputation. Chronic cortical irregularity/nonaggressive periosteal reaction along the fourth metatarsal body and neck. No cortical erosion or destruction. There is no evidence of fracture or dislocation. Subcutaneus soft tissue edema. IMPRESSION: Query interval development of cortical erosion of the fifth digit distal phalanx. Consider MRI (with contrast if GFR greater than 30) for further evaluation of possible underlying osteomyelitis. Electronically Signed   By: Iven Finn M.D.   On: 06/29/2022 23:28   CT HEAD WO CONTRAST (5MM)  Result Date: 06/29/2022 CLINICAL DATA:  Mental status change, unknown cause EXAM: CT HEAD WITHOUT CONTRAST TECHNIQUE: Contiguous axial images were obtained from the base of the skull through the vertex without intravenous contrast. RADIATION DOSE REDUCTION: This exam was  performed according to the departmental dose-optimization program which includes automated exposure control, adjustment of the mA and/or kV according to patient size and/or use of iterative reconstruction technique. COMPARISON:  Head CT 05/07/2022 FINDINGS: Brain: No intracranial hemorrhage, mass effect, or midline shift. Stable degree of atrophy. No hydrocephalus. The basilar cisterns are patent. Stable periventricular and deep white matter hypodensity typical of chronic small vessel ischemic change. No evidence of territorial infarct or acute ischemia. No extra-axial or intracranial fluid collection. Vascular: Atherosclerosis of skullbase vasculature without hyperdense vessel or abnormal calcification. Skull: No fracture or focal lesion. Sinuses/Orbits: No acute findings. The paranasal sinuses are clear. No mastoid effusion. Bilateral cataract resection. Other: None. IMPRESSION: 1. No acute intracranial abnormality. 2. Stable atrophy and chronic small vessel ischemic change. Electronically Signed   By: Keith Rake M.D.   On: 06/29/2022 22:08   US Venous Img Lower Unilateral Right  Result Date: 06/29/2022 CLINICAL DATA:  Initial  evaluation for acute leg swelling. EXAM: RIGHT LOWER EXTREMITY VENOUS DOPPLER ULTRASOUND TECHNIQUE: Gray-scale sonography with graded compression, as well as color Doppler and duplex ultrasound were performed to evaluate the lower extremity deep venous systems from the level of the common femoral vein and including the common femoral, femoral, profunda femoral, popliteal and calf veins including the posterior tibial, peroneal and gastrocnemius veins when visible. The superficial great saphenous vein was also interrogated. Spectral Doppler was utilized to evaluate flow at rest and with distal augmentation maneuvers in the common femoral, femoral and popliteal veins. COMPARISON:  None Available. FINDINGS: Contralateral Common Femoral Vein: Respiratory phasicity is normal and  symmetric with the symptomatic side. No evidence of thrombus. Normal compressibility. Common Femoral Vein: No evidence of thrombus. Normal compressibility, respiratory phasicity and response to augmentation. Saphenofemoral Junction: No evidence of thrombus. Normal compressibility and flow on color Doppler imaging. Profunda Femoral Vein: No evidence of thrombus. Normal compressibility and flow on color Doppler imaging. Femoral Vein: No evidence of thrombus. Normal compressibility, respiratory phasicity and response to augmentation. Popliteal Vein: No evidence of thrombus. Normal compressibility, respiratory phasicity and response to augmentation. Calf Veins: No evidence of thrombus. Normal compressibility and flow on color Doppler imaging. Superficial Great Saphenous Vein: No evidence of thrombus. Normal compressibility. Venous Reflux:  None. Other Findings:  None. IMPRESSION: No evidence of deep venous thrombosis. Electronically Signed   By: Jeannine Boga M.D.   On: 06/29/2022 21:44   DG Chest Portable 1 View  Result Date: 06/29/2022 CLINICAL DATA:  Shortness of breath. EXAM: PORTABLE CHEST 1 VIEW COMPARISON:  Radiograph 05/07/2022 FINDINGS: The lungs are hyperinflated. There is increased bronchial thickening from prior exam. The heart is normal in size with normal mediastinal contours. Aortic atherosclerosis. No acute airspace disease. No pneumothorax, large pleural effusion, or evidence of pulmonary edema. Chronic change of the right shoulder. IMPRESSION: Increased bronchial thickening from prior exam, may be acute bronchitis or COPD exacerbation. Electronically Signed   By: Keith Rake M.D.   On: 06/29/2022 20:08      Assessment/Plan 1. RIGHT 5th toe osteomyelitis 2. Rest pain right lower extremity- Chronic Ischemia  Will plan for angiogram on Monday to evaluate RIGHT lower extremity perfusion. SFA stent patency. NPO after MN on Sunday . Discussed plan with patient.   Evaristo Bury,  MD  07/01/2022 10:25 AM    This note was created with Dragon medical transcription system.  Any error is purely unintentional

## 2022-07-01 NOTE — Evaluation (Signed)
Occupational Therapy Evaluation Patient Details Name: [name] MRN: [id number] DOB: 09-05-1946 Today's Date: 07/01/2022   History of Present Illness Pt is a 76 year old female presenting to the ED with acute onset of worsening dyspnea with associated cough productive of yellow sputum as well as wheezing over the last 4 to 5 days. Admitted with COPD exacerbation, Osteomyelitis of fifth toe of right foot   Clinical Impression   Chart reviewed, RN cleared pt for participation in OT evaluation. Pt reports pain is limiting furthering mobility, declines to complete more than a short amb transfer to bedside chair on this date. RN present to provide pain meds. PTA pt reports pt has an aid 2hrs per day, amb short household distances with rollator and/or rolling walker. Assist required for IADLs, bathing. Bed mobility completed with supervision, STS with MIN A, short amb transfer to bedside chair with CGA with RW. Pt requires MIN A for UB dressing, SET UP for grooming tasks in seated. Pt presents with deficits in strength, endurance, activity tolerance. At this time pt mobility is limited, recommend STR to address functional deficits. Pt is left in bedside chair, NAD, all needs met. OT will follow acutely.      Recommendations for follow up therapy are one component of a multi-disciplinary discharge planning process, led by the attending physician.  Recommendations may be updated based on patient status, additional functional criteria and insurance authorization.   Follow Up Recommendations  Skilled nursing-short term rehab (<3 hours/day)    Assistance Recommended at Discharge Intermittent Supervision/Assistance  Patient can return home with the following A lot of help with walking and/or transfers;A little help with bathing/dressing/bathroom    Functional Status Assessment  Patient has had a recent decline in their functional status and demonstrates the ability to make significant improvements in  function in a reasonable and predictable amount of time.  Equipment Recommendations  Other (comment) (per next venue of care)    Recommendations for Other Services       Precautions / Restrictions Precautions Precautions: Fall Restrictions Weight Bearing Restrictions: Yes RLE Weight Bearing: Weight bearing as tolerated      Mobility Bed Mobility Overal bed mobility: Needs Assistance Bed Mobility: Supine to Sit     Supine to sit: Supervision, HOB elevated          Transfers Overall transfer level: Needs assistance Equipment used: Rolling walker (2 wheels) Transfers: Sit to/from Stand, Bed to chair/wheelchair/BSC Sit to Stand: Min assist     Step pivot transfers: Min guard            Balance Overall balance assessment: Needs assistance Sitting-balance support: Feet supported Sitting balance-Leahy Scale: Good     Standing balance support: Bilateral upper extremity supported, During functional activity Standing balance-Leahy Scale: Fair                             ADL either performed or assessed with clinical judgement   ADL Overall ADL's : Needs assistance/impaired                                       General ADL Comments: UB dressing with MIN A to doff shirt, donn gown, LB dressing with MAX A, grooming tasks with SET UP     Vision         Perception     Praxis  Pertinent Vitals/Pain Pain Assessment Pain Assessment: 0-10 Pain Score: 9  Pain Location: RLE Pain Descriptors / Indicators: Aching, Discomfort Pain Intervention(s): RN gave pain meds during session, Limited activity within patient's tolerance, Monitored during session, Repositioned     Hand Dominance Right   Extremity/Trunk Assessment Upper Extremity Assessment Upper Extremity Assessment: Overall WFL for tasks assessed (BUE MMT appears 4/5  throughout)   Lower Extremity Assessment Lower Extremity Assessment: Defer to PT evaluation;Generalized  weakness       Communication Communication Communication: HOH   Cognition Arousal/Alertness: Awake/alert Behavior During Therapy: WFL for tasks assessed/performed Overall Cognitive Status: Within Functional Limits for tasks assessed                                 General Comments: required encouragement for participation     General Comments  pt spo2 >90% throughout on 2 L via [place];    Exercises Other Exercises Other Exercises: edu re: role of OT, role of rehab, discharge recommendations, home safety/set up, DME use   Shoulder Instructions      Home Living Family/patient expects to be discharged to:: Private residence Living Arrangements: Alone Available Help at Discharge: Family;Personal care attendant;Available PRN/intermittently (2 hrs a day. 7 days a week) Type of Home: House Home Access: Stairs to enter [employer] of Steps: 4 Entrance Stairs-Rails: Left Home Layout: One level     Bathroom Shower/Tub: Walk-in shower;Tub/shower unit   Bathroom Toilet: Standard     Home Equipment: Conservation officer, nature (2 wheels);Rollator (4 wheels);Shower seat   Additional Comments: sponge bathes most of the time      Prior Functioning/Environment Prior Level of Function : Independent/Modified Independent             Mobility Comments: amb with rollator, last fall 2 months ago ADLs Comments: pt reports she lives alone with a small dog, has aids for 2hrs each day.Able to perform dressing tasks, assist required for meal prep, bathing, cleaning        OT Problem List: Decreased strength;Decreased activity tolerance;Decreased knowledge of use of DME or AE      OT Treatment/Interventions: Self-care/ADL training;Patient/family education;Therapeutic exercise;Therapeutic activities;Energy conservation;DME and/or AE instruction;Balance training    OT Goals(Current goals can be found in the care plan section) Acute Rehab OT Goals Patient Stated Goal: decrease  pain OT Goal Formulation: With patient Time For Goal Achievement: 07/15/22 Potential to Achieve Goals: Good ADL Goals Pt Will Perform Grooming: with set-up;sitting Pt Will Perform Lower Body Dressing: with set-up;sit to/from stand;sitting/lateral leans Pt Will Transfer to Toilet: with modified independence;stand pivot transfer Pt Will Perform Toileting - Clothing Manipulation and hygiene: with modified independence  OT Frequency: Min 2X/week    Co-evaluation              AM-PAC OT "6 Clicks" Daily Activity     Outcome Measure Help from another person eating meals?: None Help from another person taking care of personal grooming?: None Help from another person toileting, which includes using toliet, bedpan, or urinal?: A Little Help from another person bathing (including washing, rinsing, drying)?: A Lot Help from another person to put on and taking off regular upper body clothing?: A Little Help from another person to put on and taking off regular lower body clothing?: A Lot 6 Click Score: 18   End of Session Equipment Utilized During Treatment: Rolling walker (2 wheels);Oxygen Nurse Communication: Mobility status  Activity Tolerance: Patient tolerated treatment  well Patient left: in chair;with call bell/phone within reach;with chair alarm set  OT Visit Diagnosis: Unsteadiness on feet (R26.81);Muscle weakness (generalized) (M62.81)                Time: 0698-6148 OT Time Calculation (min): 23 min Charges:  OT General Charges $OT Visit: 1 Visit OT Evaluation $OT Eval Low Complexity: 1 Low OT Treatments $Self Care/Home Management : 8-22 mins  Shanon Payor, OTD OTR/L  07/01/22, 1:35 PM

## 2022-07-01 NOTE — Consult Note (Signed)
[facility] Vascular Consult Note  MRN : [id number]  [name] is a 76 y.o. (1946-10-10) female who presents with chief complaint of  Chief Complaint  Patient presents with   Shortness of Breath   Altered Mental Status  .  History of Present Illness: Patient known to service history of RIGHT SFA angioplasty and stenting; anterior tibial angioplasty for gangrene of the 4th toe in December 2022. Patient required additional follow up for evaluation and interventions and was lost to follow up. 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   Current Facility-Administered Medications  Medication Dose Route Frequency Provider Last Rate Last Admin   0.9 %  sodium chloride infusion   Intravenous PRN Mansy, Jan A, MD 10 mL/hr at 06/30/22 2303 New Bag at 06/30/22 2303   0.9 % NaCl with KCl 40 mEq / L  infusion   Intravenous Continuous Mansy, Jan A, MD 100 mL/hr at 07/01/22 0615 Infusion Verify at 07/01/22 0615   acetaminophen (TYLENOL) tablet 650 mg  650 mg Oral Q6H PRN Oswald Hillock, RPH   650 mg at 06/30/22 1130   Or   acetaminophen (TYLENOL) suppository 650 mg  650 mg Rectal Q6H PRN Oswald Hillock, RPH       ALPRAZolam Duanne Moron) tablet 1 mg  1 mg Oral TID PRN Mansy, Jan A, MD   1 mg at 07/01/22 0754   amLODipine (NORVASC) tablet 5 mg  5 mg Oral Daily Emeterio Reeve, DO   5 mg at 07/01/22 2836   ascorbic acid (VITAMIN C) tablet 500 mg  500 mg Oral BID Emeterio Reeve, DO   500 mg at 07/01/22 0755   atorvastatin (LIPITOR) tablet 10 mg  10 mg Oral Daily Mansy, Jan A, MD   10 mg at 07/01/22 0755   enoxaparin (LOVENOX) injection 40 mg  40 mg Subcutaneous Q24H Mansy, Jan A, MD   40 mg at 07/01/22 0756   feeding supplement (ENSURE ENLIVE / ENSURE PLUS) liquid 237 mL  237 mL Oral TID BM Emeterio Reeve, DO       hydrALAZINE (APRESOLINE) injection 10 mg  10 mg Intravenous Q6H PRN Emeterio Reeve, DO        ipratropium-albuterol (DUONEB) 0.5-2.5 (3) MG/3ML nebulizer solution 3 mL  3 mL Nebulization QID Mansy, Jan A, MD   3 mL at 07/01/22 0814   ketorolac (TORADOL) 15 MG/ML injection 15 mg  15 mg Intravenous Q6H PRN Emeterio Reeve, DO   15 mg at 07/01/22 6294   LORazepam (ATIVAN) injection 1 mg  1 mg Intravenous Once PRN Emeterio Reeve, DO       magnesium hydroxide (MILK OF MAGNESIA) suspension 30 mL  30 mL Oral Daily PRN Mansy, Jan A, MD       montelukast (SINGULAIR) tablet 10 mg  10 mg Oral Daily Mansy, Jan A, MD   10 mg at 07/01/22 0756   multivitamin with minerals tablet 1 tablet  1 tablet Oral Daily Emeterio Reeve, DO   1 tablet at 07/01/22 0754   ondansetron (ZOFRAN) tablet 4 mg  4 mg Oral Q6H PRN Mansy, Jan A, MD       Or   ondansetron [facility]) injection 4 mg  4 mg Intravenous Q6H PRN Mansy, Jan A, MD       oxyCODONE (Oxy IR/ROXICODONE) immediate release tablet 10 mg  10 mg Oral Q6H PRN Emeterio Reeve, DO   10 mg at 07/01/22 [phone]  oxyCODONE (Oxy IR/ROXICODONE) immediate release tablet 5 mg  5 mg Oral Q6H PRN Emeterio Reeve, DO   5 mg at 06/30/22 1333   piperacillin-tazobactam (ZOSYN) IVPB 3.375 g  3.375 g Intravenous Q8H Renda Rolls, RPH 12.5 mL/hr at 07/01/22 0615 Infusion Verify at 07/01/22 0615   polyethylene glycol (MIRALAX / GLYCOLAX) packet 17 g  17 g Oral Daily Emeterio Reeve, DO   17 g at 07/01/22 0912   predniSONE (DELTASONE) tablet 40 mg  40 mg Oral Q breakfast Mansy, Jan A, MD   40 mg at 07/01/22 0755   traZODone (DESYREL) tablet 25 mg  25 mg Oral QHS PRN Mansy, Jan A, MD   25 mg at 06/30/22 0245   vancomycin (VANCOREADY) IVPB 750 mg/150 mL  750 mg Intravenous Q24H Oswald Hillock, [facility]   Stopped at 07/01/22 0008   zinc sulfate capsule 220 mg  220 mg Oral Daily Emeterio Reeve, DO   220 mg at 07/01/22 3734    Past Medical History:  Diagnosis Date   Anxiety    CAD (coronary artery disease)    Carpal tunnel syndrome    Cataract    Complication of  anesthesia    has woken up at times   COPD (chronic obstructive pulmonary disease) (HCC)    CRP elevated 09/14/2015   Depression    Difficult intubation    Dyslipidemia    Dyspnea    DOE   Dysrhythmia    hx palpatations   Elevated sedimentation rate 09/14/2015   Esophageal spasm    Gastrointestinal parasites    GERD (gastroesophageal reflux disease)    HCAP (healthcare-associated pneumonia) 10/22/2018   Hiatal hernia    History of peptic ulcer disease    Hypertension    Hyperthyroidism    Hypothyroidism    Low magnesium levels 09/14/2015   Pelvic fracture ([place]) 2008   fall from riding a horse   Peripheral vascular disease ([place])    Reflux    Rotator cuff injury    Sepsis ([place]) 10/22/2018   Stenosis, spinal, lumbar     Past Surgical History:  Procedure Laterality Date   AMPUTATION TOE Right 12/26/2020   Procedure: AMPUTATION TOE-3rd Toes;  Surgeon: Samara Deist, DPM;  Location: [facility];  Service: Podiatry;  Laterality: Right;   AMPUTATION TOE Right 04/22/2021   Procedure: AMPUTATION TOE- RIGHT 2ND;  Surgeon: Samara Deist, DPM;  Location: [facility];  Service: Podiatry;  Laterality: Right;   AMPUTATION TOE Right 10/21/2021   Procedure: AMPUTATION TOE;  Surgeon: Caroline More, DPM;  Location: [facility];  Service: Podiatry;  Laterality: Right;   AMPUTATION TOE Right 11/23/2021   Procedure: AMPUTATION TOE;  Surgeon: Sharlotte Alamo, DPM;  Location: [facility];  Service: Podiatry;  Laterality: Right;   APPENDECTOMY     BACK SURGERY     CERVICAL FUSION   CARPAL TUNNEL RELEASE     CATARACT EXTRACTION W/PHACO Right 08/07/2017   Procedure: CATARACT EXTRACTION PHACO AND INTRAOCULAR LENS PLACEMENT ([place]);  Surgeon: Birder Robson, MD;  Location: [facility];  Service: Ophthalmology;  Laterality: Right;  [place] 00:52.0 AP% 16.8 CDE 8.74 Fluid Pack Lot # [id number]   CATARACT EXTRACTION W/PHACO Left 09/04/2017   Procedure: CATARACT EXTRACTION PHACO AND INTRAOCULAR LENS PLACEMENT ([place]);  Surgeon:  Birder Robson, MD;  Location: [facility];  Service: Ophthalmology;  Laterality: Left;  [place] 00:34 AP% 17.0 CDE 5.80 Fluid pack lot # [id number]   CESAREAN SECTION     CHOLECYSTECTOMY     FRACTURE SURGERY  HIP SURGERY     INTRAMEDULLARY (IM) NAIL INTERTROCHANTERIC Right 10/01/2016   Procedure: INTRAMEDULLARY (IM) NAIL INTERTROCHANTRIC;  Surgeon: Corky Mull, MD;  Location: [facility];  Service: Orthopedics;  Laterality: Right;   INTRAMEDULLARY (IM) NAIL INTERTROCHANTERIC Left 01/14/2021   Procedure: INTRAMEDULLARY (IM) NAIL INTERTROCHANTRIC;  Surgeon: Lovell Sheehan, MD;  Location: [facility];  Service: Orthopedics;  Laterality: Left;   KYPHOPLASTY N/A 08/20/2018   Procedure: [id number];  Surgeon: Hessie Knows, MD;  Location: [facility];  Service: Orthopedics;  Laterality: N/A;   LOWER EXTREMITY ANGIOGRAPHY Right 12/18/2019   Procedure: LOWER EXTREMITY ANGIOGRAPHY;  Surgeon: Algernon Huxley, MD;  Location: [place] [facility];  Service: Cardiovascular;  Laterality: Right;   LOWER EXTREMITY ANGIOGRAPHY Right 12/02/2020   Procedure: LOWER EXTREMITY ANGIOGRAPHY;  Surgeon: Algernon Huxley, MD;  Location: [place] [facility];  Service: Cardiovascular;  Laterality: Right;   LOWER EXTREMITY ANGIOGRAPHY Right 12/23/2020   Procedure: Lower Extremity Angiography;  Surgeon: Algernon Huxley, MD;  Location: [place] [facility];  Service: Cardiovascular;  Laterality: Right;   LOWER EXTREMITY ANGIOGRAPHY Right 12/24/2020   Procedure: Lower Extremity Angiography;  Surgeon: Algernon Huxley, MD;  Location: [place] [facility];  Service: Cardiovascular;  Laterality: Right;   LOWER EXTREMITY ANGIOGRAPHY Right 10/20/2021   Procedure: Lower Extremity Angiography;  Surgeon: Algernon Huxley, MD;  Location: [place] [facility];  Service: Cardiovascular;  Laterality: Right;   NECK SURGERY     NOSE SURGERY     ROTATOR CUFF REPAIR     x2   SHOULDER ARTHROSCOPY  12/07/2011   Procedure: ARTHROSCOPY SHOULDER;  Surgeon: Ninetta Lights,  MD;  Location: [place];  Service: Orthopedics;  Laterality: Right;  Debridement Partial Cuff Tear, Release Coracoacromial Ligament   SHOULDER SURGERY  12/07/2011   right    Social History Social History   Tobacco Use   Smoking status: Some Days    Packs/day: 0.00    Types: Cigarettes   Smokeless tobacco: Never   Tobacco comments:    pt states 2 cigs per month   Substance Use Topics   Alcohol use: No   Drug use: No    Family History Family History  Problem Relation Age of Onset   Aneurysm Other     No Known Allergies   REVIEW OF SYSTEMS (Negative unless checked)  Constitutional: '[]'$ Weight loss  '[]'$ Fever  '[]'$ Chills Cardiac: '[]'$ Chest pain   '[]'$ Chest pressure   '[]'$ Palpitations   '[]'$ Shortness of breath when laying flat   '[]'$ Shortness of breath at rest   '[]'$ Shortness of breath with exertion. Vascular:  '[x]'$ Pain in legs with walking   '[x]'$ Pain in legs at rest   '[]'$ Pain in legs when laying flat   '[]'$ Claudication   '[]'$ Pain in feet when walking  '[x]'$ Pain in feet at rest  '[]'$ Pain in feet when laying flat   '[]'$ History of DVT   '[]'$ Phlebitis   '[]'$ Swelling in legs   '[]'$ Varicose veins   '[]'$ Non-healing ulcers Pulmonary:   '[]'$ Uses home oxygen   '[]'$ Productive cough   '[]'$ Hemoptysis   '[]'$ Wheeze  '[]'$ COPD   '[]'$ Asthma Neurologic:  '[]'$ Dizziness  '[]'$ Blackouts   '[]'$ Seizures   '[]'$ History of stroke   '[]'$ History of TIA  '[]'$ Aphasia   '[]'$ Temporary blindness   '[]'$ Dysphagia   '[]'$ Weakness or numbness in arms   '[]'$ Weakness or numbness in legs Musculoskeletal:  '[]'$ Arthritis   '[]'$ Joint swelling   '[]'$ Joint pain   '[]'$ Low back pain Hematologic:  '[]'$ Easy bruising  '[]'$ Easy bleeding   '[]'$ Hypercoagulable  state   '[]'$ Anemic  '[]'$ Hepatitis Gastrointestinal:  '[]'$ Blood in stool   '[]'$ Vomiting blood  '[]'$ Gastroesophageal reflux/heartburn   '[]'$ Difficulty swallowing. Genitourinary:  '[]'$ Chronic kidney disease   '[]'$ Difficult urination  '[]'$ Frequent urination  '[]'$ Burning with urination   '[]'$ Blood in urine Skin:  '[]'$ Rashes   '[x]'$ Ulcers   '[]'$ Wounds Psychological:  '[]'$ History of  anxiety   '[]'$  History of major depression.  Physical Examination  Vitals:   07/01/22 0329 07/01/22 0330 07/01/22 0814 07/01/22 0905  BP: (!) 184/96 (!) 179/86  (!) 193/93  Pulse: 93 95  97  Resp: 18   (!) 22  Temp: 97.8 F (36.6 C)   97.7 F (36.5 C)  TempSrc: Oral     SpO2: 100%  98% 99%  Weight:       Body mass index is 20.21 kg/m. Gen:  WD/WN, NAD Pulmonary:  Good air movement, respirations not labored, equal bilaterally.  Cardiac: RRR, normal S1, S2. Vascular:  Vessel Right Left  Radial Palpable Palpable  Ulnar Palpable Palpable  Brachial Palpable Palpable  Carotid Palpable, without bruit Palpable, without bruit  Aorta Not palpable N/A  Femoral Palpable Palpable  Popliteal Non palpable   PT    DP Non palpable    Gastrointestinal: soft, non-tender/non-distended. No guarding/reflex.  Musculoskeletal: M/S 5/5 throughout.  Extremities Right - foot cool, gangrene of RIGHT 5th toe, great toe cyanotic, +motor/+sensory Neurologic: Sensation grossly intact in extremities.  Symmetrical.  Speech is fluent. Motor exam as listed above.  Dermatologic:ulcer 5 toe Lymph : No Cervical, Axillary, or Inguinal lymphadenopathy.     CBC Lab Results  Component Value Date   WBC 11.6 (H) 07/01/2022   HGB 10.7 (L) 07/01/2022   HCT 33.2 (L) 07/01/2022   MCV 88.3 07/01/2022   PLT 228 07/01/2022    BMET    Component Value Date/Time   NA 139 07/01/2022 0421   NA 143 10/06/2019 1552   NA 138 02/10/2014 1335   K 4.9 07/01/2022 0421   K 4.5 02/10/2014 1335   CL 111 07/01/2022 0421   CL 106 02/10/2014 1335   CO2 24 07/01/2022 0421   CO2 28 02/10/2014 1335   GLUCOSE 122 (H) 07/01/2022 0421   GLUCOSE 99 02/10/2014 1335   BUN 20 07/01/2022 0421   BUN 7 (L) 10/06/2019 1552   BUN 6 (L) 02/10/2014 1335   CREATININE 0.78 07/01/2022 0421   CREATININE 0.89 02/10/2014 1335   CALCIUM 8.1 (L) 07/01/2022 0421   CALCIUM 8.7 02/10/2014 1335   GFRNONAA >60 07/01/2022 0421   GFRNONAA >60  02/10/2014 1335   GFRAA >60 12/18/2019 1012   GFRAA >60 02/10/2014 1335   Estimated Creatinine Clearance: 51.2 mL/min (by C-G formula based on SCr of 0.78 mg/dL).  COAG Lab Results  Component Value Date   INR 1.1 06/30/2022   INR 1.1 05/07/2022   INR 1.2 01/14/2021    Radiology MR FOOT RIGHT W WO CONTRAST  Result Date: 06/30/2022 CLINICAL DATA:  Discoloration of toes.  Abnormal x-ray EXAM: MRI OF THE RIGHT FOREFOOT WITHOUT AND WITH CONTRAST TECHNIQUE: Multiplanar, multisequence MR imaging of the right forefoot was performed before and after the administration of intravenous contrast. CONTRAST:  21m GADAVIST GADOBUTROL 1 MMOL/ML IV SOLN COMPARISON:  X-ray 06/29/2022 FINDINGS: Bones/Joint/Cartilage Status post second, third, and fourth toe amputations. There is bone marrow edema and enhancement within the distal phalanx of the fifth toe with confluent low T1 marrow signal change compatible with acute osteomyelitis. Bone marrow edema and mild enhancement is also present throughout  the distal phalanx of the great toe with some intermediate T1 bone marrow signal, which could be posttraumatic or represent early acute osteomyelitis. Changes of osteonecrosis involving the second, fourth, and fifth metatarsal heads. Osseous structures appear otherwise intact. No malalignment. Ligaments Intact Lisfranc ligament.  No acute collateral ligament injury. Muscles and Tendons Post amputation changes within the musculotendinous structures related to the second through fourth toes. Chronic fatty infiltration of the musculature. No tenosynovial fluid collection. Soft tissues Soft tissue edema of the fifth toe.  No fluid collections. IMPRESSION: 1. Acute osteomyelitis of the distal phalanx of the fifth toe. 2. Bone marrow edema and mild enhancement throughout the distal phalanx of the great toe, which could be posttraumatic or represent early acute osteomyelitis. 3. Status post second, third, and fourth toe amputations.  4. Changes of osteonecrosis involving the second, fourth, and fifth metatarsal heads. Electronically Signed   By: Davina Poke D.O.   On: 06/30/2022 10:24   DG Foot Complete Left  Result Date: 06/29/2022 CLINICAL DATA:  toe discolation EXAM: LEFT FOOT - COMPLETE 3+ VIEW COMPARISON:  None Available. FINDINGS: No cortical erosion or destruction. Cortical irregularity along the head of the second digit proximal phalanx likely old healed fracture. There is no evidence of fracture or dislocation. There is no evidence of arthropathy or other focal bone abnormality. Soft tissues are unremarkable. IMPRESSION: 1. No cortical erosion or destruction to suggest osteomyelitis of the left foot. 2. No definite acute displaced fracture or dislocation. Electronically Signed   By: Iven Finn M.D.   On: 06/29/2022 23:30   DG Foot Complete Right  Result Date: 06/29/2022 CLINICAL DATA:  toe discolation EXAM: RIGHT FOOT COMPLETE - 3+ VIEW COMPARISON:  X-ray right foot 05/07/2022 FINDINGS: Query interval development of cortical erosion of the fifth digit distal phalanx. Query prior second, third, fourth toe phalangeal amputation. Chronic cortical irregularity/nonaggressive periosteal reaction along the fourth metatarsal body and neck. No cortical erosion or destruction. There is no evidence of fracture or dislocation. Subcutaneus soft tissue edema. IMPRESSION: Query interval development of cortical erosion of the fifth digit distal phalanx. Consider MRI (with contrast if GFR greater than 30) for further evaluation of possible underlying osteomyelitis. Electronically Signed   By: Iven Finn M.D.   On: 06/29/2022 23:28   CT HEAD WO CONTRAST (5MM)  Result Date: 06/29/2022 CLINICAL DATA:  Mental status change, unknown cause EXAM: CT HEAD WITHOUT CONTRAST TECHNIQUE: Contiguous axial images were obtained from the base of the skull through the vertex without intravenous contrast. RADIATION DOSE REDUCTION: This exam was  performed according to the departmental dose-optimization program which includes automated exposure control, adjustment of the mA and/or kV according to patient size and/or use of iterative reconstruction technique. COMPARISON:  Head CT 05/07/2022 FINDINGS: Brain: No intracranial hemorrhage, mass effect, or midline shift. Stable degree of atrophy. No hydrocephalus. The basilar cisterns are patent. Stable periventricular and deep white matter hypodensity typical of chronic small vessel ischemic change. No evidence of territorial infarct or acute ischemia. No extra-axial or intracranial fluid collection. Vascular: Atherosclerosis of skullbase vasculature without hyperdense vessel or abnormal calcification. Skull: No fracture or focal lesion. Sinuses/Orbits: No acute findings. The paranasal sinuses are clear. No mastoid effusion. Bilateral cataract resection. Other: None. IMPRESSION: 1. No acute intracranial abnormality. 2. Stable atrophy and chronic small vessel ischemic change. Electronically Signed   By: Keith Rake M.D.   On: 06/29/2022 22:08   US Venous Img Lower Unilateral Right  Result Date: 06/29/2022 CLINICAL DATA:  Initial  evaluation for acute leg swelling. EXAM: RIGHT LOWER EXTREMITY VENOUS DOPPLER ULTRASOUND TECHNIQUE: Gray-scale sonography with graded compression, as well as color Doppler and duplex ultrasound were performed to evaluate the lower extremity deep venous systems from the level of the common femoral vein and including the common femoral, femoral, profunda femoral, popliteal and calf veins including the posterior tibial, peroneal and gastrocnemius veins when visible. The superficial great saphenous vein was also interrogated. Spectral Doppler was utilized to evaluate flow at rest and with distal augmentation maneuvers in the common femoral, femoral and popliteal veins. COMPARISON:  None Available. FINDINGS: Contralateral Common Femoral Vein: Respiratory phasicity is normal and  symmetric with the symptomatic side. No evidence of thrombus. Normal compressibility. Common Femoral Vein: No evidence of thrombus. Normal compressibility, respiratory phasicity and response to augmentation. Saphenofemoral Junction: No evidence of thrombus. Normal compressibility and flow on color Doppler imaging. Profunda Femoral Vein: No evidence of thrombus. Normal compressibility and flow on color Doppler imaging. Femoral Vein: No evidence of thrombus. Normal compressibility, respiratory phasicity and response to augmentation. Popliteal Vein: No evidence of thrombus. Normal compressibility, respiratory phasicity and response to augmentation. Calf Veins: No evidence of thrombus. Normal compressibility and flow on color Doppler imaging. Superficial Great Saphenous Vein: No evidence of thrombus. Normal compressibility. Venous Reflux:  None. Other Findings:  None. IMPRESSION: No evidence of deep venous thrombosis. Electronically Signed   By: Jeannine Boga M.D.   On: 06/29/2022 21:44   DG Chest Portable 1 View  Result Date: 06/29/2022 CLINICAL DATA:  Shortness of breath. EXAM: PORTABLE CHEST 1 VIEW COMPARISON:  Radiograph 05/07/2022 FINDINGS: The lungs are hyperinflated. There is increased bronchial thickening from prior exam. The heart is normal in size with normal mediastinal contours. Aortic atherosclerosis. No acute airspace disease. No pneumothorax, large pleural effusion, or evidence of pulmonary edema. Chronic change of the right shoulder. IMPRESSION: Increased bronchial thickening from prior exam, may be acute bronchitis or COPD exacerbation. Electronically Signed   By: Keith Rake M.D.   On: 06/29/2022 20:08      Assessment/Plan 1. RIGHT 5th toe osteomyelitis 2. Rest pain right lower extremity- Chronic Ischemia  Will plan for angiogram on Monday to evaluate RIGHT lower extremity perfusion. SFA stent patency. NPO after MN on Sunday . Discussed plan with patient.   Evaristo Bury,  MD  07/01/2022 10:25 AM    This note was created with Dragon medical transcription system.  Any error is purely unintentional

## 2022-07-01 NOTE — Progress Notes (Signed)
   07/01/22 1613  Assess: MEWS Score  Temp 98.3 F (36.8 C)  BP (!) 155/78  MAP (mmHg) 101  Pulse Rate (!) 118  Resp 20  SpO2 100 %  O2 Device Nasal Cannula  O2 Flow Rate (L/min) 2 L/min  Assess: MEWS Score  MEWS Temp 0  MEWS Systolic 0  MEWS Pulse 2  MEWS RR 0  MEWS LOC 0  MEWS Score 2  MEWS Score Color Yellow  Assess: if the MEWS score is Yellow or Red  Were vital signs taken at a resting state? Yes  Focused Assessment Change from prior assessment (see assessment flowsheet)  Does the patient meet 2 or more of the SIRS criteria? No  MEWS guidelines implemented *See Row Information* Yes  Treat  MEWS Interventions Administered prn meds/treatments  Pain Scale 0-10  Pain Score 9  Pain Type Acute pain  Pain Location Foot  Pain Orientation Right;Left  Pain Descriptors / Indicators Aching  Patients Stated Pain Goal 3  Pain Intervention(s) Medication (See eMAR)  Multiple Pain Sites Yes  Complains of Anxiety  Neuro symptoms relieved by Anti-anxiety medication  Take Vital Signs  Increase Vital Sign Frequency  Yellow: Q 2hr X 2 then Q 4hr X 2, if remains yellow, continue Q 4hrs  Escalate  MEWS: Escalate Yellow: discuss with charge nurse/RN and consider discussing with provider and RRT  Notify: Charge Nurse/RN  Name of Charge Nurse/RN Notified Misty Burns RN  Date Charge Nurse/RN Notified 07/01/22  Time Charge Nurse/RN Notified 9  Notify: Provider  Provider Name/Title MD Sheppard Coil  Date Provider Notified 07/01/22  Time Provider Notified 1615  Method of Notification Page  Notification Reason Change in status  Provider response See new orders  Date of Provider Response 07/01/22  Time of Provider Response 1615  Document  Patient Outcome Stabilized after interventions (Pain and anxiety meds given)  Progress note created (see row info) Yes  Assess: SIRS CRITERIA  SIRS Temperature  0  SIRS Pulse 1  SIRS Respirations  0  SIRS WBC 1  SIRS Score Sum  2

## 2022-07-01 NOTE — Evaluation (Addendum)
Physical Therapy Evaluation Patient Details Name: Meria Crilly MRN: [id number] DOB: 07/20/1946 Today's Date: 07/01/2022  History of Present Illness  Pt is a 76 year old female presenting to the ED with acute onset of worsening dyspnea with associated cough productive of yellow sputum as well as wheezing over the last 4 to 5 days. Admitted with COPD exacerbation, osteomyelitis of fifth toe of right foot, and increased RLE pain 2/2 chronic ischemia of RLE.  Clinical Impression  Pt presents alert and oriented and eager supine in bed with [facility] raised eager to participate in PT with nursing present. Session limited by pt's increased anxiety and pain in right foot. She shows ability to mobilize close to her baseline with use of 2L O2 and min VC for sequencing of activities and encouragement for increased engagement in session. Pt showed elevated HR and increased SOB after activity but did return baseline respiratory rate and HR after she performed pursed lipped breathing with prompting of PT. PT recommends d/c home with additional HHPT to provided additional skilled PT to help patient return to functional baseline so that patient remains independent and at a decreased risk for falling. Pt currently has caregivers who provided additional assistance and supervision.      Recommendations for follow up therapy are one component of a multi-disciplinary discharge planning process, led by the attending physician.  Recommendations may be updated based on patient status, additional functional criteria and insurance authorization.  Follow Up Recommendations Home health PT      Assistance Recommended at Discharge Intermittent Supervision/Assistance  Patient can return home with the following  Assist for transportation;Direct supervision/assist for medications management;Assistance with cooking/housework    Equipment Recommendations    Recommendations for Other Services       Functional Status Assessment  Patient has had a recent decline in their functional status and demonstrates the ability to make significant improvements in function in a reasonable and predictable amount of time.     Precautions / Restrictions Precautions Precautions: Fall Restrictions Weight Bearing Restrictions: Yes RLE Weight Bearing: Weight bearing as tolerated      Mobility  Bed Mobility Overal bed mobility: Modified Independent Bed Mobility: Supine to Sit, Sit to Supine     Supine to sit: Supervision Sit to supine: Supervision   General bed mobility comments: Increased to for setup    Transfers Overall transfer level: Modified independent Equipment used: Rolling walker (2 wheels) Transfers: Sit to/from Stand Sit to Stand: Modified independent (Device/Increase time)                Ambulation/Gait Ambulation/Gait assistance: Modified independent (Device/Increase time) Gait Distance (Feet): 80 Feet Assistive device: Rolling walker (2 wheels) Gait Pattern/deviations: Step-through pattern, Narrow base of support   Gait velocity interpretation: >4.37 ft/sec, indicative of normal walking speed      Stairs            Wheelchair Mobility    Modified Rankin (Stroke Patients Only)       Balance Overall balance assessment: Modified Independent Sitting-balance support: No upper extremity supported Sitting balance-Leahy Scale: Normal     Standing balance support: Bilateral upper extremity supported Standing balance-Leahy Scale: Good                               Pertinent Vitals/Pain Pain Assessment Pain Assessment: 0-10 Pain Score: 7  Pain Location: RLE Pain Descriptors / Indicators: Aching, Discomfort Pain Intervention(s): Limited activity within patient's tolerance,  Monitored during session, Patient requesting pain meds-RN notified    Home Living Family/patient expects to be discharged to:: Private residence Living Arrangements: Alone Available Help at  Discharge: Family;Personal care attendant;Available PRN/intermittently Type of Home: House Home Access: Stairs to enter Entrance Stairs-Rails: Left Entrance Stairs-Number of Steps: 4   Home Layout: One level Home Equipment: Conservation officer, nature (2 wheels);Rollator (4 wheels);Shower seat Additional Comments: sponge bathes most of the time    Prior Function Prior Level of Function : Independent/Modified Independent             Mobility Comments: amb with rollator, last fall 2 months ago ADLs Comments: pt reports she lives alone with a small dog, has aids for 2hrs each day.Able to perform dressing tasks, assist required for meal prep, bathing, cleaning     Hand Dominance   Dominant Hand: Right    Extremity/Trunk Assessment   Upper Extremity Assessment Upper Extremity Assessment: Defer to OT evaluation    Lower Extremity Assessment Lower Extremity Assessment: Overall WFL for tasks assessed       Communication   Communication: HOH  Cognition Arousal/Alertness: Awake/alert Behavior During Therapy: WFL for tasks assessed/performed Overall Cognitive Status: Within Functional Limits for tasks assessed                                 General Comments: required encouragement for participation        General Comments General comments (skin integrity, edema, etc.): pt spo2 >90% throughout on 2 L via [place]ish [place];    Exercises     Assessment/Plan    PT Assessment Patient needs continued PT services  PT Problem List Decreased strength;Pain;Decreased mobility;Decreased skin integrity       PT Treatment Interventions DME instruction;Balance training;Stair training;Functional mobility training;Therapeutic activities;Therapeutic exercise;Gait training;Neuromuscular re-education    PT Goals (Current goals can be found in the Care Plan section)  Acute Rehab PT Goals Patient Stated Goal: To return home and not go back to SNF for PT. She describes having a horrible experience  at SNF PT Goal Formulation: With patient Time For Goal Achievement: 07/15/22 Potential to Achieve Goals: Good    Frequency Min 2X/week     Co-evaluation               AM-PAC PT "6 Clicks" Mobility  Outcome Measure Help needed turning from your back to your side while in a flat bed without using bedrails?: None Help needed moving from lying on your back to sitting on the side of a flat bed without using bedrails?: None Help needed moving to and from a bed to a chair (including a wheelchair)?: None Help needed standing up from a chair using your arms (e.g., wheelchair or bedside chair)?: A Little Help needed to walk in hospital room?: A Lot Help needed climbing 3-5 steps with a railing? : A Lot 6 Click Score: 19    End of Session Equipment Utilized During Treatment: Gait belt;Oxygen Activity Tolerance: Patient limited by pain Patient left: in bed;with call bell/phone within reach;with nursing/sitter in room Nurse Communication: Mobility status PT Visit Diagnosis: Unsteadiness on feet (R26.81);Difficulty in walking, not elsewhere classified (R26.2);History of falling (Z91.81);Other abnormalities of gait and mobility (R26.89)    Time: 1510-1530 PT Time Calculation (min) (ACUTE ONLY): 20 min   Charges:   PT Evaluation $PT Eval Moderate Complexity: 1 Mod PT Treatments $Therapeutic Activity: 8-22 mins   Bradly Chris PT, DPT  07/01/2022, 5:07 PM

## 2022-07-02 ENCOUNTER — Inpatient Hospital Stay: Payer: Medicare HMO

## 2022-07-02 DIAGNOSIS — R079 Chest pain, unspecified: Secondary | ICD-10-CM

## 2022-07-02 DIAGNOSIS — J441 Chronic obstructive pulmonary disease with (acute) exacerbation: Secondary | ICD-10-CM | POA: Diagnosis not present

## 2022-07-02 LAB — CREATININE, SERUM
Creatinine, Ser: 0.84 mg/dL (ref 0.44–1.00)
GFR, Estimated: >60 mL/min (ref >60)

## 2022-07-02 LAB — CULTURE, BLOOD (ROUTINE X 2): Special Requests: ADEQUATE

## 2022-07-02 LAB — TROPONIN I (HIGH SENSITIVITY): Troponin I (High Sensitivity): 15 ng/L (ref <18)

## 2022-07-02 LAB — GLUCOSE, CAPILLARY: Glucose-Capillary: 107 mg/dL — ABNORMAL HIGH (ref 70–99)

## 2022-07-02 MED ORDER — METOPROLOL TARTRATE 25 MG PO TABS
25.0000 mg | ORAL_TABLET | Freq: Two times a day (BID) | ORAL | Status: DC
  Administered 2022-07-02 (×2): 25 mg via ORAL
  Filled 2022-07-02 (×2): qty 1

## 2022-07-02 MED ORDER — ALUM & MAG HYDROXIDE-SIMETH 200-200-20 MG/5ML PO SUSP
30.0000 mL | Freq: Once | ORAL | Status: AC
  Administered 2022-07-02: 30 mL via ORAL
  Filled 2022-07-02: qty 30

## 2022-07-02 NOTE — Assessment & Plan Note (Signed)
Eval for ACS in high risk patient, suspect GI or respiratory source also possible / more likely Pt declining CT chest / head at this time   GI cocktail, EKG, tropes  if symptoms resolve will monitor but if not will send for CTA chest to eval dissection given her c/o pain between shoulder blades and CT head given headache.

## 2022-07-02 NOTE — Progress Notes (Addendum)
PROGRESS NOTE    [name]   LNL:[id number] DOB: 30-May-1946  DOA: 06/29/2022 Date of Service: 07/02/22 PCP: Albina Billet, MD     Brief Narrative / Hospital Course:  [name] is a 76 y.o. Caucasian female with medical history significant for CAD, COPD, depression, dyslipidemia, hypertension and hypothyroidism, who presented to the ER 06/29/2022 with acute onset of worsening dyspnea with associated cough productive of yellow sputum as well as wheezing over the last 4 to 5 days.  Also complaining of foot pain and ulceration, 08/10: Tachycardic 126, tachypneic 26, on baseline 2 L [place] at home and was requiring 4 L O2 [place].  Hypokalemia 2.8, creatinine 1.17, troponins flat at 13 and 14, lactic acid 2.5 trended down to 1.3, procalcitonin less than 0.1, leukocytosis 13.3.  CXR concerning for bronchial thickening due to acute bronchitis/COPD exacerbation.  No DVT on ultrasound, CT head no concerns, XR left foot no acute concerns, XR right foot concerning for osteomyelitis.  Patient was started on IV antibiotics with cefepime, Flagyl, vancomycin.  500 mL NS bolus.  IV magnesium sulfate, Solu-Medrol, Xanax.  Admitted to hospitalist service with COPD exacerbation with acute on chronic hypoxic respiratory failure, sepsis due to osteomyelitis fifth toe of right foot to continue on vancomycin and Zosyn and podiatry consult obtained, electrolyte derangement including hypokalemia 08/11: MRI R foot (+)  osteomyelitis of the distal phalanx of the fifth toe. Osteonecrosis involving the second, fourth, and fifth metatarsal heads. Bone marrow edema distal phalanx of the great toe posttraumatic vs early acute osteomyelitis.  Podiatry saw patient: Continue IV ABX, weightbearing as tolerated, ABI ordered, recommending vascular consult and pending vascular studies may need to consider amputation after possible angiography versus sooner if necrosis/sepsis 08/12: vascular surgery planning for angiography Monday 08/14.   Leukocytosis likely due to steroids. SInus tach confirmed on EKG, added BB given HTN. SPO2 at goal on 2 to 4 L [place]. One of 3 BCx (+)likely contaminant.  08/13: SpO2 at goal on 2L, hypertensive and sinus tach, increased metoprolol. Pt co chest pain "I think I'm having a heart attack" apptox 10:15 reports CP and pain between shoulder blades but once told will be ordering tests/scan (CT head / chest) she states "no I just have a headache my chest is fine I don't want to leave the room." Ordered GI cocktail, EKG, tropes, if symptoms resolve will monitor but if not will send for CTA chest and CT head. - addendum: EKG no concerns, pt reports pain has resolved    Consultants:  Podiatry Vascular surgery   Procedures: none    Subjective: Patient reports continued foot pain, headache, chest pain "I think I'm having a heart attack" apptox 10:15 reports CP and pain between shoulder blades but once told will be ordering tests/scan (CT head / chest) she states "no I just have a headache my chest is fine I don't want to leave the room."     ASSESSMENT & PLAN:   Principal Problem:   COPD exacerbation ([place]) Active Problems:   Acute on chronic respiratory failure with hypoxia (HCC)   Osteomyelitis of fifth toe of right foot (HCC)   Hypokalemia   Hypomagnesemia   Essential hypertension   Hypothyroidism   Dyslipidemia   Anxiety and depression   Chest pain   COPD exacerbation ([place]) Acute on chronic respiratory failure with hypoxia IV SoluMedrol, bronchodilator therapy with DuoNebs and mucolytic therapy with Mucinex. Supplemental O2   Osteomyelitis of fifth toe of right foot ([place]) (+)  sepsis POA as manifested by leukocytosis, tachycardia and tachypnea. MRI results as per hospital course  IV vancomycin and Zosyn. Pain management will be provided. Podiatry consult   Hypothyroidism - We will continue Synthroid.  Hypomagnesemia - Magnesium will be replaced  Hypokalemia - Potassium will be  replaced.  Essential hypertension - We will continue antihypertensives  Anxiety and depression - We will continue Xanax and Wellbutrin XL as well as Lexapro.  Dyslipidemia - We will continue statin therapy.  Chest pain Eval for ACS in high risk patient, suspect GI or respiratory source also possible / more likely Pt declining CT chest / head at this time  GI cocktail, EKG, tropes if symptoms resolve will monitor but if not will send for CTA chest to eval dissection given her c/o pain between shoulder blades and CT head given headache.  addendum: EKG no concerns, pt reports pain has resolved    DVT prophylaxis: lovenox Code Status: full Family Communication: none at this time  Disposition Plan / TOC needs: Pending clinical course, if needing amputation may need SNF placement for rehab Barriers to discharge / significant pending items: IV antibiotics for osteomyelitis/sepsis, podiatry & vascular surgery recommendations             Objective: Vitals:   07/01/22 1926 07/01/22 2113 07/02/22 0501 07/02/22 0918  BP: (!) 158/77  (!) 169/86 (!) 147/89  Pulse: (!) 101  87 95  Resp: '20  20 20  '$ Temp: 98.5 F (36.9 C)  97.8 F (36.6 C) 97.6 F (36.4 C)  TempSrc:    Oral  SpO2: 100% 100% 99% 97%  Weight:        Intake/Output Summary (Last 24 hours) at 07/02/2022 1439 Last data filed at 07/02/2022 0505 Gross per 24 hour  Intake 2196.99 ml  Output 450 ml  Net 1746.99 ml   Filed Weights   06/29/22 1947  Weight: 53.4 kg    Examination:  Constitutional:  VS as above General Appearance: alert, NAD, thin, hard of hearing  Neck: No masses, trachea midline Respiratory: Normal respiratory effort Diminished breath sounds in all lung fields +diffuse wheeze No rhonchi No rales Cardiovascular: S1/S2 normal No lower extremity edema Gastrointestinal: No tenderness Musculoskeletal:  No clubbing/cyanosis of digits Grossly symmetrical movement in all extremities See  photos below  Neurological: No cranial nerve deficit on limited exam Alert Psychiatric: Normal judgment/insight Normal mood and affect           Scheduled Medications:   ALPRAZolam  1 mg Oral TID   amLODipine  5 mg Oral Daily   vitamin C  500 mg Oral BID   atorvastatin  10 mg Oral Daily   enoxaparin (LOVENOX) injection  40 mg Subcutaneous Q24H   feeding supplement  237 mL Oral TID BM   ipratropium-albuterol  3 mL Nebulization QID   metoprolol tartrate  25 mg Oral BID   montelukast  10 mg Oral Daily   multivitamin with minerals  1 tablet Oral Daily   polyethylene glycol  17 g Oral Daily   predniSONE  40 mg Oral Q breakfast   zinc sulfate  220 mg Oral Daily    Continuous Infusions:  sodium chloride 10 mL/hr at 07/01/22 1042   piperacillin-tazobactam (ZOSYN)  IV 3.375 g (07/02/22 1412)   vancomycin 750 mg (07/01/22 2108)    PRN Medications:  sodium chloride, acetaminophen **OR** acetaminophen, hydrALAZINE, HYDROcodone-acetaminophen, ketorolac, LORazepam, magnesium hydroxide, ondansetron **OR** ondansetron (ZOFRAN) IV, traZODone  Antimicrobials:  Anti-infectives (From admission, onward)  Start     Dose/Rate Route Frequency Ordered Stop   07/01/22 1200  vancomycin (VANCOREADY) IVPB 1250 mg/250 mL  Status:  Discontinued        1,250 mg 166.7 mL/hr over 90 Minutes Intravenous Every 48 hours 06/30/22 0224 06/30/22 1100   06/30/22 2200  vancomycin (VANCOREADY) IVPB 750 mg/150 mL        750 mg 150 mL/hr over 60 Minutes Intravenous Every 24 hours 06/30/22 1100     06/30/22 0600  piperacillin-tazobactam (ZOSYN) IVPB 3.375 g        3.375 g 12.5 mL/hr over 240 Minutes Intravenous Every 8 hours 06/30/22 0227     06/30/22 0215  vancomycin (VANCOCIN) IVPB 1000 mg/200 mL premix  Status:  Discontinued        1,000 mg 200 mL/hr over 60 Minutes Intravenous  Once 06/30/22 0210 06/30/22 0223   06/30/22 0215  piperacillin-tazobactam (ZOSYN) IVPB 3.375 g  Status:  Discontinued         3.375 g 100 mL/hr over 30 Minutes Intravenous Every 6 hours 06/30/22 0210 06/30/22 0226   06/29/22 2245  ceFEPIme (MAXIPIME) 2 g in sodium chloride 0.9 % 100 mL IVPB        2 g 200 mL/hr over 30 Minutes Intravenous  Once 06/29/22 2242 06/30/22 0159   06/29/22 2245  metroNIDAZOLE (FLAGYL) IVPB 500 mg        500 mg 100 mL/hr over 60 Minutes Intravenous  Once 06/29/22 2242 06/30/22 0042   06/29/22 2245  vancomycin (VANCOCIN) IVPB 1000 mg/200 mL premix        1,000 mg 200 mL/hr over 60 Minutes Intravenous  Once 06/29/22 2242 06/30/22 0120       Data Reviewed: I have personally reviewed following labs and imaging studies  CBC: Recent Labs  Lab 06/29/22 2118 06/30/22 0526 07/01/22 0421  WBC 13.3* 6.0 11.6*  NEUTROABS 10.7*  --   --   HGB 12.1 10.7* 10.7*  HCT 37.2 33.2* 33.2*  MCV 87.1 86.2 88.3  PLT 260 240 761   Basic Metabolic Panel: Recent Labs  Lab 06/29/22 2118 06/30/22 0526 07/01/22 0421 07/02/22 0447  NA 139 139 139  --   K 2.8* 4.0 4.9  --   CL 104 108 111  --   CO2 '26 25 24  '$ --   GLUCOSE 110* 146* 122*  --   BUN '20 20 20  '$ --   CREATININE 1.17* 0.87 0.78 0.84  CALCIUM 8.7* 8.1* 8.1*  --   MG 1.6*  --  2.2  --    GFR: Estimated Creatinine Clearance: 48.8 mL/min (by C-G formula based on SCr of 0.84 mg/dL). Liver Function Tests: Recent Labs  Lab 06/29/22 2118  AST 26  ALT 20  ALKPHOS 73  BILITOT 0.8  PROT 6.7  ALBUMIN 3.5   No results for input(s): "LIPASE", "AMYLASE" in the last 168 hours. No results for input(s): "AMMONIA" in the last 168 hours. Coagulation Profile: Recent Labs  Lab 06/30/22 0526  INR 1.1   Cardiac Enzymes: No results for input(s): "CKTOTAL", "CKMB", "CKMBINDEX", "TROPONINI" in the last 168 hours. BNP (last 3 results) No results for input(s): "PROBNP" in the last 8760 hours. HbA1C: No results for input(s): "HGBA1C" in the last 72 hours. CBG: No results for input(s): "GLUCAP" in the last 168 hours. Lipid Profile: No  results for input(s): "CHOL", "HDL", "LDLCALC", "TRIG", "CHOLHDL", "LDLDIRECT" in the last 72 hours. Thyroid Function Tests: Recent Labs    07/01/22 0421  TSH 0.229*  FREET4 0.97   Anemia Panel: No results for input(s): "VITAMINB12", "FOLATE", "FERRITIN", "TIBC", "IRON", "RETICCTPCT" in the last 72 hours. Urine analysis:    Component Value Date/Time   COLORURINE YELLOW (A) 06/29/2022 2358   APPEARANCEUR HAZY (A) 06/29/2022 2358   LABSPEC 1.017 06/29/2022 2358   PHURINE 5.0 06/29/2022 2358   GLUCOSEU 50 (A) 06/29/2022 2358   HGBUR MODERATE (A) 06/29/2022 2358   BILIRUBINUR NEGATIVE 06/29/2022 2358   KETONESUR 5 (A) 06/29/2022 2358   PROTEINUR 30 (A) 06/29/2022 2358   NITRITE NEGATIVE 06/29/2022 2358   LEUKOCYTESUR NEGATIVE 06/29/2022 2358   Sepsis Labs: '@LABRCNTIP'$ (procalcitonin:4,lacticidven:4)  Recent Results (from the past 240 hour(s))  Blood culture (routine x 2)     Status: None (Preliminary result)   Collection Time: 06/29/22  7:53 PM   Specimen: BLOOD  Result Value Ref Range Status   Specimen Description BLOOD LEFT FOREARM  Final   Special Requests IN PEDIATRIC BOTTLE Blood Culture adequate volume  Final   Culture  Setup Time   Final    GRAM POSITIVE RODS IN PEDIATRIC BOTTLE CRITICAL RESULT CALLED TO, READ BACK BY AND VERIFIED WITH: WILL ANDERSON ON 07/01/22 AT 1249 QSD Performed at [facility], [address], [place], [place] [address]    Culture GRAM POSITIVE RODS  Final   Report Status PENDING  Incomplete  SARS Coronavirus 2 by RT PCR (hospital order, performed in [facility] hospital lab) *cepheid single result test* Anterior Nasal Swab     Status: None   Collection Time: 06/29/22  7:53 PM   Specimen: Anterior Nasal Swab  Result Value Ref Range Status   SARS Coronavirus 2 by RT PCR NEGATIVE NEGATIVE Final    Comment: (NOTE) SARS-CoV-2 target nucleic acids are NOT DETECTED.  The SARS-CoV-2 RNA is generally detectable in upper and  lower respiratory specimens during the acute phase of infection. The lowest concentration of SARS-CoV-2 viral copies this assay can detect is 250 copies / mL. A negative result does not preclude SARS-CoV-2 infection and should not be used as the sole basis for treatment or other patient management decisions.  A negative result may occur with improper specimen collection / handling, submission of specimen other than nasopharyngeal swab, presence of viral mutation(s) within the areas targeted by this assay, and inadequate number of viral copies (<250 copies / mL). A negative result must be combined with clinical observations, patient history, and epidemiological information.  Fact Sheet for Patients:   https://www.patel.info/  Fact Sheet for Healthcare Providers: https://hall.com/  This test is not yet approved or  cleared by the [place] FDA and has been authorized for detection and/or diagnosis of SARS-CoV-2 by FDA under an Emergency Use Authorization (EUA).  This EUA will remain in effect (meaning this test can be used) for the duration of the COVID-19 declaration under Section 564(b)(1) of the Act, 21 U.S.C. section 360bbb-3(b)(1), unless the authorization is terminated or revoked sooner.  Performed at [facility], [address] [place], [place] [address]   Culture, blood (Routine X 2) w Reflex to ID Panel     Status: None (Preliminary result)   Collection Time: 06/29/22  9:20 PM   Specimen: BLOOD  Result Value Ref Range Status   Specimen Description BLOOD BLOOD LEFT FOREARM  Final   Special Requests   Final    BOTTLES DRAWN AEROBIC AND ANAEROBIC Blood Culture adequate volume   Culture   Final    NO GROWTH 3 DAYS Performed at  [facility], [address] [place], [place] [address]    Report Status PENDING  Incomplete  Urine Culture     Status: None   Collection Time: 06/29/22 11:21 PM   Specimen:  Urine, Random  Result Value Ref Range Status   Specimen Description   Final    URINE, RANDOM Performed at [facility], [address] [place], [place] [address]    Special Requests   Final    NONE Performed at [facility], [address], [place], [place] [address]    Culture   Final    NO GROWTH Performed at [facility], [place] [address], [place],  [address]    Report Status 07/01/2022 FINAL  Final         Radiology Studies: DG Foot Complete Left  Result Date: 06/29/2022 CLINICAL DATA:  toe discolation EXAM: LEFT FOOT - COMPLETE 3+ VIEW COMPARISON:  None Available. FINDINGS: No cortical erosion or destruction. Cortical irregularity along the head of the second digit proximal phalanx likely old healed fracture. There is no evidence of fracture or dislocation. There is no evidence of arthropathy or other focal bone abnormality. Soft tissues are unremarkable. IMPRESSION: 1. No cortical erosion or destruction to suggest osteomyelitis of the left foot. 2. No definite acute displaced fracture or dislocation. Electronically Signed   By: Iven Finn M.D.   On: 06/29/2022 23:30   DG Foot Complete Right  Result Date: 06/29/2022 CLINICAL DATA:  toe discolation EXAM: RIGHT FOOT COMPLETE - 3+ VIEW COMPARISON:  X-ray right foot 05/07/2022 FINDINGS: Query interval development of cortical erosion of the fifth digit distal phalanx. Query prior second, third, fourth toe phalangeal amputation. Chronic cortical irregularity/nonaggressive periosteal reaction along the fourth metatarsal body and neck. No cortical erosion or destruction. There is no evidence of fracture or dislocation. Subcutaneus soft tissue edema. IMPRESSION: Query interval development of cortical erosion of the fifth digit distal phalanx. Consider MRI (with contrast if GFR greater than 30) for further evaluation of possible underlying osteomyelitis. Electronically Signed   By: Iven Finn M.D.    On: 06/29/2022 23:28   CT HEAD WO CONTRAST (5MM)  Result Date: 06/29/2022 CLINICAL DATA:  Mental status change, unknown cause EXAM: CT HEAD WITHOUT CONTRAST TECHNIQUE: Contiguous axial images were obtained from the base of the skull through the vertex without intravenous contrast. RADIATION DOSE REDUCTION: This exam was performed according to the departmental dose-optimization program which includes automated exposure control, adjustment of the mA and/or kV according to patient size and/or use of iterative reconstruction technique. COMPARISON:  Head CT 05/07/2022 FINDINGS: Brain: No intracranial hemorrhage, mass effect, or midline shift. Stable degree of atrophy. No hydrocephalus. The basilar cisterns are patent. Stable periventricular and deep white matter hypodensity typical of chronic small vessel ischemic change. No evidence of territorial infarct or acute ischemia. No extra-axial or intracranial fluid collection. Vascular: Atherosclerosis of skullbase vasculature without hyperdense vessel or abnormal calcification. Skull: No fracture or focal lesion. Sinuses/Orbits: No acute findings. The paranasal sinuses are clear. No mastoid effusion. Bilateral cataract resection. Other: None. IMPRESSION: 1. No acute intracranial abnormality. 2. Stable atrophy and chronic small vessel ischemic change. Electronically Signed   By: Keith Rake M.D.   On: 06/29/2022 22:08   US Venous Img Lower Unilateral Right  Result Date: 06/29/2022 CLINICAL DATA:  Initial evaluation for acute leg swelling. EXAM: RIGHT LOWER EXTREMITY VENOUS DOPPLER ULTRASOUND TECHNIQUE: Gray-scale sonography with graded compression, as well as color Doppler and duplex ultrasound were  performed to evaluate the lower extremity deep venous systems from the level of the common femoral vein and including the common femoral, femoral, profunda femoral, popliteal and calf veins including the posterior tibial, peroneal and gastrocnemius veins when  visible. The superficial great saphenous vein was also interrogated. Spectral Doppler was utilized to evaluate flow at rest and with distal augmentation maneuvers in the common femoral, femoral and popliteal veins. COMPARISON:  None Available. FINDINGS: Contralateral Common Femoral Vein: Respiratory phasicity is normal and symmetric with the symptomatic side. No evidence of thrombus. Normal compressibility. Common Femoral Vein: No evidence of thrombus. Normal compressibility, respiratory phasicity and response to augmentation. Saphenofemoral Junction: No evidence of thrombus. Normal compressibility and flow on color Doppler imaging. Profunda Femoral Vein: No evidence of thrombus. Normal compressibility and flow on color Doppler imaging. Femoral Vein: No evidence of thrombus. Normal compressibility, respiratory phasicity and response to augmentation. Popliteal Vein: No evidence of thrombus. Normal compressibility, respiratory phasicity and response to augmentation. Calf Veins: No evidence of thrombus. Normal compressibility and flow on color Doppler imaging. Superficial Great Saphenous Vein: No evidence of thrombus. Normal compressibility. Venous Reflux:  None. Other Findings:  None. IMPRESSION: No evidence of deep venous thrombosis. Electronically Signed   By: Jeannine Boga M.D.   On: 06/29/2022 21:44   DG Chest Portable 1 View  Result Date: 06/29/2022 CLINICAL DATA:  Shortness of breath. EXAM: PORTABLE CHEST 1 VIEW COMPARISON:  Radiograph 05/07/2022 FINDINGS: The lungs are hyperinflated. There is increased bronchial thickening from prior exam. The heart is normal in size with normal mediastinal contours. Aortic atherosclerosis. No acute airspace disease. No pneumothorax, large pleural effusion, or evidence of pulmonary edema. Chronic change of the right shoulder. IMPRESSION: Increased bronchial thickening from prior exam, may be acute bronchitis or COPD exacerbation. Electronically Signed   By: Keith Rake M.D.   On: 06/29/2022 20:08            LOS: 3 days       Emeterio Reeve, DO [facility] 07/02/2022, 2:39 PM   Staff may message me via secure chat in [place]  but this may not receive immediate response,  please page for urgent matters!  If 7PM-7AM, please contact night-coverage [email or web address]  Dictation software was used to generate the above note. Typos may occur and escape review, as with typed/written notes. Please contact Dr Sheppard Coil directly for clarity if needed.

## 2022-07-02 NOTE — Progress Notes (Signed)
Occupational Therapy Treatment Patient Details Name: Odaliz Mcqueary MRN: [id number] DOB: 01-May-1946 Today's Date: 07/02/2022   History of present illness Pt is a 76 year old female presenting to the ED with acute onset of worsening dyspnea with associated cough productive of yellow sputum as well as wheezing over the last 4 to 5 days. Admitted with COPD exacerbation, osteomyelitis of fifth toe of right foot, and increased RLE pain 2/2 chronic ischemia of RLE.   OT comments  Pt's fxl mobility and ability to perform ADLs appears to be considerably improved compared to yesterday's session. She is able to perform bed mobility, transfers, w/in room ambulating, toileting, grooming, and UB and LB dressing, all with SUPV for safety. Pt slightly impulsive, tends to walk quickly; provided cueing for slowing down and being more aware of safety concerns. Pt verbalized understanding but made few adjustments in behavior. Pt reports she has had several falls in her shower at home -- encouraged her to shower only when she has attendant care available. With pt's improvement today, have changed DC recs to home with [place]. Pt states she is able to keep attendants on for >2 hours/day if needed. Pt reports, however, that she believes podiatrist is still considering surgery to amputate 5th toe on R foot. If they proceed with that surgery, then will need to re-evaluate DC plans.   Recommendations for follow up therapy are one component of a multi-disciplinary discharge planning process, led by the attending physician.  Recommendations may be updated based on patient status, additional functional criteria and insurance authorization.    Follow Up Recommendations  Home health OT    Assistance Recommended at Discharge Intermittent Supervision/Assistance  Patient can return home with the following  A little help with walking and/or transfers;A little help with bathing/dressing/bathroom;Assistance with cooking/housework    Equipment Recommendations       Recommendations for Other Services      Precautions / Restrictions Precautions Precautions: Fall Restrictions Weight Bearing Restrictions: Yes RLE Weight Bearing: Weight bearing as tolerated       Mobility Bed Mobility Overal bed mobility: Modified Independent Bed Mobility: Supine to Sit     Supine to sit: Supervision          Transfers Overall transfer level: Modified independent Equipment used: Rolling walker (2 wheels) Transfers: Sit to/from Stand Sit to Stand: Modified independent (Device/Increase time)     Step pivot transfers: Supervision           Balance Overall balance assessment: Needs assistance Sitting-balance support: No upper extremity supported Sitting balance-Leahy Scale: Normal     Standing balance support: Bilateral upper extremity supported Standing balance-Leahy Scale: Good                             ADL either performed or assessed with clinical judgement   ADL Overall ADL's : Needs assistance/impaired Eating/Feeding: Modified independent;Set up   Grooming: Wash/dry hands;Wash/dry face;Standing;Supervision/safety;Applying deodorant           Upper Body Dressing : Supervision/safety;Sitting Upper Body Dressing Details (indicate cue type and reason): foffing/donning gown Lower Body Dressing: Sitting/lateral leans;Supervision/safety Lower Body Dressing Details (indicate cue type and reason): doffing/donning socks Toilet Transfer: Supervision/safety;Regular Toilet;Grab bars;Stand-pivot Toilet Transfer Details (indicate cue type and reason): Performed transfer w/o physical asst, but could have paid more attention to safety -- rushed movements, did not come down in center of toilet seat Toileting- Clothing Manipulation and Hygiene: Modified independent;Sitting/lateral lean  Functional mobility during ADLs: Supervision/safety;Rolling walker (2 wheels)      Extremity/Trunk  Assessment Upper Extremity Assessment Upper Extremity Assessment: Overall WFL for tasks assessed   Lower Extremity Assessment Lower Extremity Assessment: Overall WFL for tasks assessed        Vision       Perception     Praxis      Cognition Arousal/Alertness: Awake/alert Behavior During Therapy: WFL for tasks assessed/performed Overall Cognitive Status: Within Functional Limits for tasks assessed                                          Exercises Other Exercises Other Exercises: educ re: home safety, falls prevention, home modifications, DME use, DC recs    Shoulder Instructions       General Comments Spo2 drops to 87% on room air with mild exertion    Pertinent Vitals/ Pain       Pain Assessment Pain Score: 8  Pain Location: R foot Pain Descriptors / Indicators: Aching, Discomfort Pain Intervention(s): Limited activity within patient's tolerance, Repositioned  Home Living Family/patient expects to be discharged to:: Private residence                                        Prior Functioning/Environment              Frequency  Min 2X/week        Progress Toward Goals  OT Goals(current goals can now be found in the care plan section)  Progress towards OT goals: Progressing toward goals  Acute Rehab OT Goals OT Goal Formulation: With patient Time For Goal Achievement: 07/15/22 Potential to Achieve Goals: Good  Plan Discharge plan needs to be updated;Frequency remains appropriate    Co-evaluation                 AM-PAC OT "6 Clicks" Daily Activity     Outcome Measure   Help from another person eating meals?: None Help from another person taking care of personal grooming?: None Help from another person toileting, which includes using toliet, bedpan, or urinal?: A Little Help from another person bathing (including washing, rinsing, drying)?: A Lot Help from another person to put on and taking off regular  upper body clothing?: A Little Help from another person to put on and taking off regular lower body clothing?: A Little 6 Click Score: 19    End of Session Equipment Utilized During Treatment: Rolling walker (2 wheels);Oxygen  OT Visit Diagnosis: Unsteadiness on feet (R26.81);Muscle weakness (generalized) (M62.81)   Activity Tolerance Patient tolerated treatment well   Patient Left in chair;with call bell/phone within reach   Nurse Communication Mobility status        Time: 5631-4970 OT Time Calculation (min): 32 min  Charges: OT General Charges $OT Visit: 1 Visit OT Treatments $Self Care/Home Management : 23-37 mins Josiah Lobo, PhD, MS, OTR/L 07/02/22, 3:02 PM

## 2022-07-03 ENCOUNTER — Encounter
Admission: EM | Disposition: A | Discharge status: Home Health | Payer: Self-pay | Source: Home / Self Care | Attending: Osteopathic Medicine

## 2022-07-03 DIAGNOSIS — F419 Anxiety disorder, unspecified: Secondary | ICD-10-CM

## 2022-07-03 DIAGNOSIS — M869 Osteomyelitis, unspecified: Secondary | ICD-10-CM | POA: Diagnosis not present

## 2022-07-03 DIAGNOSIS — F32A Depression, unspecified: Secondary | ICD-10-CM

## 2022-07-03 DIAGNOSIS — T82856A Stenosis of peripheral vascular stent, initial encounter: Secondary | ICD-10-CM | POA: Diagnosis not present

## 2022-07-03 DIAGNOSIS — I70261 Atherosclerosis of native arteries of extremities with gangrene, right leg: Secondary | ICD-10-CM | POA: Diagnosis not present

## 2022-07-03 DIAGNOSIS — E876 Hypokalemia: Secondary | ICD-10-CM | POA: Diagnosis not present

## 2022-07-03 DIAGNOSIS — E039 Hypothyroidism, unspecified: Secondary | ICD-10-CM

## 2022-07-03 DIAGNOSIS — I10 Essential (primary) hypertension: Secondary | ICD-10-CM

## 2022-07-03 DIAGNOSIS — J9621 Acute and chronic respiratory failure with hypoxia: Secondary | ICD-10-CM | POA: Diagnosis not present

## 2022-07-03 DIAGNOSIS — I743 Embolism and thrombosis of arteries of the lower extremities: Secondary | ICD-10-CM

## 2022-07-03 DIAGNOSIS — E785 Hyperlipidemia, unspecified: Secondary | ICD-10-CM

## 2022-07-03 DIAGNOSIS — J441 Chronic obstructive pulmonary disease with (acute) exacerbation: Secondary | ICD-10-CM | POA: Diagnosis not present

## 2022-07-03 HISTORY — PX: LOWER EXTREMITY ANGIOGRAPHY: CATH118251

## 2022-07-03 LAB — CREATININE, SERUM
Creatinine, Ser: 0.75 mg/dL (ref 0.44–1.00)
GFR, Estimated: >60 mL/min (ref >60)

## 2022-07-03 SURGERY — LOWER EXTREMITY ANGIOGRAPHY
Anesthesia: Moderate Sedation | Laterality: Right

## 2022-07-03 MED ORDER — TIROFIBAN (AGGRASTAT) BOLUS VIA INFUSION
25.0000 ug/kg | Freq: Once | INTRAVENOUS | Status: DC
  Filled 2022-07-03: qty 27

## 2022-07-03 MED ORDER — SODIUM CHLORIDE 0.9 % IV SOLN: INTRAVENOUS | Status: DC

## 2022-07-03 MED ORDER — IPRATROPIUM-ALBUTEROL 0.5-2.5 (3) MG/3ML IN SOLN
3.0000 mL | Freq: Four times a day (QID) | RESPIRATORY_TRACT | Status: DC | PRN
  Administered 2022-07-04: 3 mL via RESPIRATORY_TRACT

## 2022-07-03 MED ORDER — MIDAZOLAM HCL 2 MG/ML PO SYRP: 8.0000 mg | ORAL_SOLUTION | Freq: Once | ORAL | Status: AC

## 2022-07-03 MED ORDER — TIROFIBAN HCL IN NACL 5-0.9 MG/100ML-% IV SOLN
0.1500 ug/kg/min | INTRAVENOUS | Status: AC
  Filled 2022-07-03 (×2): qty 100

## 2022-07-03 MED ORDER — NITROGLYCERIN 1 MG/10 ML FOR IR/CATH LAB
INTRA_ARTERIAL | Status: DC | PRN
  Administered 2022-07-03: 300 ug

## 2022-07-03 MED ORDER — HEPARIN SODIUM (PORCINE) 1000 UNIT/ML IJ SOLN
INTRAMUSCULAR | Status: DC | PRN
  Administered 2022-07-03: 5000 [IU] via INTRAVENOUS

## 2022-07-03 MED ORDER — MIDAZOLAM HCL 5 MG/5ML IJ SOLN
INTRAMUSCULAR | Status: AC
  Filled 2022-07-03: qty 5

## 2022-07-03 MED ORDER — MIDAZOLAM HCL 2 MG/2ML IJ SOLN
INTRAMUSCULAR | Status: DC | PRN
  Administered 2022-07-03: 1 mg via INTRAVENOUS

## 2022-07-03 MED ORDER — APIXABAN 5 MG PO TABS
5.0000 mg | ORAL_TABLET | Freq: Two times a day (BID) | ORAL | Status: DC
  Administered 2022-07-04 – 2022-07-08 (×8): 5 mg via ORAL
  Filled 2022-07-03 (×8): qty 1

## 2022-07-03 MED ORDER — METOPROLOL TARTRATE 50 MG PO TABS
50.0000 mg | ORAL_TABLET | Freq: Two times a day (BID) | ORAL | Status: DC
  Administered 2022-07-03 – 2022-07-08 (×10): 50 mg via ORAL
  Filled 2022-07-03 (×10): qty 1

## 2022-07-03 MED ORDER — VANCOMYCIN HCL IN DEXTROSE 1-5 GM/200ML-% IV SOLN
1000.0000 mg | INTRAVENOUS | Status: DC
  Administered 2022-07-03 – 2022-07-05 (×3): 1000 mg via INTRAVENOUS
  Filled 2022-07-03 (×3): qty 200

## 2022-07-03 MED ORDER — MIDAZOLAM HCL 2 MG/ML PO SYRP
ORAL_SOLUTION | ORAL | Status: AC
  Administered 2022-07-03: 8 mg via ORAL
  Filled 2022-07-03: qty 4

## 2022-07-03 MED ORDER — FENTANYL CITRATE (PF) 100 MCG/2ML IJ SOLN
INTRAMUSCULAR | Status: DC | PRN
Start: 2022-07-03 — End: 2022-07-03
  Administered 2022-07-03: 50 ug via INTRAVENOUS

## 2022-07-03 MED ORDER — IODIXANOL 320 MG/ML IV SOLN
INTRAVENOUS | Status: DC | PRN
  Administered 2022-07-03: 50 mL

## 2022-07-03 MED ORDER — FENTANYL CITRATE (PF) 100 MCG/2ML IJ SOLN
INTRAMUSCULAR | Status: AC
  Filled 2022-07-03: qty 2

## 2022-07-03 MED ORDER — HEPARIN SODIUM (PORCINE) 1000 UNIT/ML IJ SOLN
INTRAMUSCULAR | Status: AC
  Filled 2022-07-03: qty 10

## 2022-07-03 SURGICAL SUPPLY — 22 items
BALLN LUTONIX  018 4X60X130 (BALLOONS) ×2
BALLN LUTONIX 018 4X60X130 (BALLOONS) ×1
BALLN ULTRVRSE 2.5X300X150 (BALLOONS) ×2
BALLN ULTRVRSE 3X150X150 (BALLOONS) ×2
BALLOON LUTONIX 018 4X60X130 (BALLOONS) IMPLANT
BALLOON ULTRVRSE 2.5X300X150 (BALLOONS) IMPLANT
BALLOON ULTRVRSE 3X150X150 (BALLOONS) IMPLANT
CANISTER PENUMBRA ENGINE (MISCELLANEOUS) ×1 IMPLANT
CATH ANGIO 5F PIGTAIL 65CM (CATHETERS) ×1 IMPLANT
CATH INDIGO CAT6 KIT (CATHETERS) ×1 IMPLANT
CATH KUMPE SOFT-VU 5FR 65 (CATHETERS) ×1 IMPLANT
CATH NAVICROSS ANGLED 135CM (MICROCATHETER) ×1 IMPLANT
DEVICE STARCLOSE SE CLOSURE (Vascular Products) ×1 IMPLANT
GLIDEWIRE ADV .035X260CM (WIRE) ×1 IMPLANT
KIT ENCORE 26 ADVANTAGE (KITS) ×1 IMPLANT
PACK ANGIOGRAPHY (CUSTOM PROCEDURE TRAY) ×2 IMPLANT
SHEATH BRITE TIP 5FRX11 (SHEATH) ×1 IMPLANT
SHEATH PINNACLE ST 6F 45CM (SHEATH) ×1 IMPLANT
SHEATH PROBE COVER 6X72 (BAG) ×1 IMPLANT
SYR MEDRAD MARK 7 150ML (SYRINGE) ×1 IMPLANT
WIRE G V18X300CM (WIRE) ×1 IMPLANT
WIRE GUIDERIGHT .035X150 (WIRE) ×1 IMPLANT

## 2022-07-03 NOTE — Progress Notes (Signed)
Pharmacy Antibiotic Note  [name] is a 76 y.o. female with history of CAD, COPD, HTN admitted on 06/29/2022 with  osteomyelitis of fifth toe of right foot . XRay and MRI of right foot positive for acute osteomyelitis of the fifth toe. 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 Pharmacy has been consulted for Vancomycin dosing.  Plan:  Day 5 of abx.  Adjusting Vancomycin to 1000 mg IV Q 24 hrs. Goal AUC 400-550. Expected AUC: 554.4 SCr used: 0.8(actual 0.75), TBW 53.4 kg < IBW 54.7 kg Check vancomycin levels after 4th or 5th dose   Continue Zosyn 3.375 [place] will continue to follow and will adjust abx dosing whenever warranted. Per [facility] Podiatry, plan to de-escalate in the next 24-48 hours  Height: '5\' 4"'$  (162.6 cm) Weight: 53.4 kg (117 lb 11.6 oz) IBW/kg (Calculated) : 54.7  Temp (24hrs), Avg:97.8 F (36.6 C), Min:97.5 F (36.4 C), Max:98.3 F (36.8 C)   Recent Labs  Lab 06/29/22 1953 06/29/22 2118 06/29/22 2156 06/30/22 0526 07/01/22 0421 07/02/22 0447 07/03/22 0433  WBC  --  13.3*  --  6.0 11.6*  --   --   CREATININE  --  1.17*  --  0.87 0.78 0.84 0.75  LATICACIDVEN 2.5*  --  1.3  --   --   --   --      Estimated Creatinine Clearance: 51.2 mL/min (by C-G formula based on SCr of 0.75 mg/dL).    No Known Allergies  Antimicrobials this admission:  8/11 Cefepime >> x 1 dose 8/10 Flagyl >> x 1 dose 8/10 Vancomycin >>  8/11 Zosyn >>  Microbiology results: 8/10 BCx: diphtheroids  8/10 UCx: NGTD  Thank you for allowing pharmacy to be a part of this patient's care.  Pearla Dubonnet, PharmD Clinical Pharmacist 07/03/2022 2:32 PM

## 2022-07-03 NOTE — Progress Notes (Addendum)
Patient ID: [name], female   DOB: [birth year], 76 y.o.   MRN: [id number] Patient back in room post angiogram and resting quietly at this time. Instructed not to raise head until 1635 when the 90 minutes have been completed. Patient states understanding.   Haydee Salter, RN

## 2022-07-03 NOTE — Progress Notes (Signed)
PROGRESS NOTE    [name] [facility]   YDX:[id number] DOB: [birth year]  DOA: 06/29/2022 Date of Service: 07/03/22 PCP: Alison Billet, MD     Brief Narrative / Hospital Course:  [name] is a 76 y.o. Caucasian female with medical history significant for CAD, COPD, depression, dyslipidemia, hypertension and hypothyroidism, who presented to the ER 06/29/2022 with acute onset of worsening dyspnea with associated cough productive of yellow sputum as well as wheezing over the last 4 to 5 days.  Also complaining of foot pain and ulceration, 08/10: Tachycardic 126, tachypneic 26, on baseline 2 L [place] at home and was requiring 4 L O2 [place].  Hypokalemia 2.8, creatinine 1.17, troponins flat at 13 and 14, lactic acid 2.5 trended down to 1.3, procalcitonin less than 0.1, leukocytosis 13.3.  CXR concerning for bronchial thickening due to acute bronchitis/COPD exacerbation.  No DVT on ultrasound, CT head no concerns, XR left foot no acute concerns, XR right foot concerning for osteomyelitis.  Patient was started on IV antibiotics with cefepime, Flagyl, vancomycin.  500 mL NS bolus.  IV magnesium sulfate, Solu-Medrol, Xanax.  Admitted to hospitalist service with COPD exacerbation with acute on chronic hypoxic respiratory failure, sepsis due to osteomyelitis fifth toe of right foot to continue on vancomycin and Zosyn and podiatry consult obtained, electrolyte derangement including hypokalemia 08/11: MRI R foot (+)  osteomyelitis of the distal phalanx of the fifth toe. Osteonecrosis involving the second, fourth, and fifth metatarsal heads. Bone marrow edema distal phalanx of the great toe posttraumatic vs early acute osteomyelitis.  Podiatry saw patient: Continue IV ABX, weightbearing as tolerated, ABI ordered, recommending vascular consult and pending vascular studies may need to consider amputation after possible angiography versus sooner if necrosis/sepsis 08/12: vascular surgery planning for angiography Monday 08/14.   Leukocytosis likely due to steroids. SInus tach confirmed on EKG, added BB given HTN. SPO2 at goal on 2 to 4 L [place]. One of 3 BCx (+)likely contaminant.  08/13: SpO2 at goal on 2L, hypertensive and sinus tach, increased metoprolol.  08/14: LE Angiogram planned for today.    Consultants:  Podiatry Vascular surgery   Procedures: none    Subjective: Patient reports continued foot pain, no CP/SOB.      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 Supplemental O2   Osteomyelitis of fifth toe of right foot (HCC) (+) sepsis POA as manifested by leukocytosis, tachycardia and tachypnea. MRI results as per hospital course  IV vancomycin and Zosyn. Pain management Vascular surgery planning angiography today to assess arterial disease  Podiatry consult   Hypothyroidism Continue Synthroid.  Hypomagnesemia repleted Serial Mg labs  Hypokalemia repleted  Serial BMP  Essential hypertension Continue antihypertensives  Anxiety and depression continue Xanax as well as Lexapro, Wellbutrin   Dyslipidemia Continue statin therapy.    DVT prophylaxis: lovenox Code Status: full Family Communication: none at this time  Disposition Plan / TOC needs: Pending clinical course, if needing amputation may need SNF placement for rehab Barriers to discharge / significant pending items: IV antibiotics for osteomyelitis/sepsis, podiatry & vascular surgery recommendations, anticipate discharge  later this week if needing amputation               Objective: Vitals:   07/03/22 1453 07/03/22 1458 07/03/22 1503 07/03/22 1508  BP: 133/80 114/72 (!) 135/92 139/79  Pulse: (!) 105 96 (!) 111 (!) 0  Resp: '19 16 17   '$ Temp:      TempSrc:      SpO2: 100% 99% 100%   Weight:      Height:        Intake/Output Summary (Last 24 hours) at 07/03/2022 1519 Last data filed at 07/03/2022 1500 Gross per 24 hour  Intake 46.62 ml  Output 1850 ml  Net -1803.38 ml   Filed Weights   06/29/22 1947 07/03/22 1248  Weight: 53.4 kg 53.4 kg    Examination:  Constitutional:  VS as above General Appearance: alert, NAD, thin, hard of hearing  Neck: No masses, trachea midline Respiratory: Normal respiratory effort Diminished breath sounds in all lung fields +diffuse wheeze No rhonchi No rales Cardiovascular: S1/S2 normal No lower extremity edema Gastrointestinal: No tenderness Musculoskeletal:  No clubbing/cyanosis of digits Grossly symmetrical movement in all extremities See photos below  Neurological: No cranial nerve deficit on limited exam Alert Psychiatric: Fair judgment/insight Anxious mood and affect           Scheduled Medications:   [MAR Hold] ALPRAZolam  1 mg Oral TID   [MAR Hold] amLODipine  5 mg Oral Daily   [MAR Hold] vitamin C  500 mg Oral BID   [MAR Hold] atorvastatin  10 mg Oral Daily   [MAR Hold] enoxaparin (LOVENOX) injection  40 mg Subcutaneous Q24H   [MAR Hold] feeding supplement  237 mL Oral TID BM   fentaNYL       heparin sodium (porcine)       [MAR Hold] ipratropium-albuterol  3 mL Nebulization QID   [MAR Hold] metoprolol tartrate  50 mg Oral BID   midazolam       [MAR Hold] montelukast  10 mg Oral Daily   [MAR Hold] multivitamin with minerals  1 tablet Oral Daily   [MAR Hold] polyethylene glycol  17 g Oral Daily   [MAR Hold] predniSONE  40 mg Oral Q breakfast   [MAR Hold] zinc sulfate  220 mg Oral Daily    Continuous Infusions:  [MAR Hold] sodium chloride Stopped  (07/03/22 0010)   sodium chloride 20 mL/hr at 07/03/22 1258   [MAR Hold] piperacillin-tazobactam (ZOSYN)  IV 3.375 g (07/03/22 0510)   vancomycin      PRN Medications:  [MAR Hold] sodium chloride, [MAR Hold] acetaminophen **OR** [MAR Hold] acetaminophen, fentaNYL, fentaNYL, heparin sodium (porcine), heparin sodium (porcine), [MAR Hold] hydrALAZINE, [MAR Hold] HYDROcodone-acetaminophen, iodixanol, [MAR Hold] ketorolac, [MAR Hold] LORazepam, [MAR Hold] magnesium hydroxide, midazolam, midazolam, nitroGLYCERIN, [MAR Hold] ondansetron **OR** [MAR Hold] ondansetron (ZOFRAN) IV, [MAR Hold] traZODone  Antimicrobials:  Anti-infectives (From admission, onward)    Start     Dose/Rate Route Frequency Ordered Stop   07/03/22 2200  vancomycin (VANCOCIN) IVPB 1000 mg/200 mL premix        1,000 mg 200 mL/hr over 60 Minutes Intravenous Every 24 hours 07/03/22 1430     07/01/22 1200  vancomycin (VANCOREADY) IVPB 1250 mg/250 mL  Status:  Discontinued        1,250 mg 166.7 mL/hr over 90 Minutes Intravenous Every 48 hours 06/30/22 0224 06/30/22 1100   06/30/22 2200  vancomycin (VANCOREADY) IVPB 750 mg/150 mL  Status:  Discontinued  750 mg 150 mL/hr over 60 Minutes Intravenous Every 24 hours 06/30/22 1100 07/03/22 1430   06/30/22 0600  [MAR Hold]  piperacillin-tazobactam (ZOSYN) IVPB 3.375 g        (MAR Hold since Mon 07/03/2022 at 1240.Hold Reason: Transfer to a Procedural area)   3.375 g 12.5 mL/hr over 240 Minutes Intravenous Every 8 hours 06/30/22 0227     06/30/22 0215  vancomycin (VANCOCIN) IVPB 1000 mg/200 mL premix  Status:  Discontinued        1,000 mg 200 mL/hr over 60 Minutes Intravenous  Once 06/30/22 0210 06/30/22 0223   06/30/22 0215  piperacillin-tazobactam (ZOSYN) IVPB 3.375 g  Status:  Discontinued        3.375 g 100 mL/hr over 30 Minutes Intravenous Every 6 hours 06/30/22 0210 06/30/22 0226   06/29/22 2245  ceFEPIme (MAXIPIME) 2 g in sodium chloride 0.9 % 100 mL IVPB        2 g 200  mL/hr over 30 Minutes Intravenous  Once 06/29/22 2242 06/30/22 0159   06/29/22 2245  metroNIDAZOLE (FLAGYL) IVPB 500 mg        500 mg 100 mL/hr over 60 Minutes Intravenous  Once 06/29/22 2242 06/30/22 0042   06/29/22 2245  vancomycin (VANCOCIN) IVPB 1000 mg/200 mL premix        1,000 mg 200 mL/hr over 60 Minutes Intravenous  Once 06/29/22 2242 06/30/22 0120       Data Reviewed: I have personally reviewed following labs and imaging studies  CBC: Recent Labs  Lab 06/29/22 2118 06/30/22 0526 07/01/22 0421  WBC 13.3* 6.0 11.6*  NEUTROABS 10.7*  --   --   HGB 12.1 10.7* 10.7*  HCT 37.2 33.2* 33.2*  MCV 87.1 86.2 88.3  PLT 260 240 270   Basic Metabolic Panel: Recent Labs  Lab 06/29/22 2118 06/30/22 0526 07/01/22 0421 07/02/22 0447 07/03/22 0433  NA 139 139 139  --   --   K 2.8* 4.0 4.9  --   --   CL 104 108 111  --   --   CO2 '26 25 24  '$ --   --   GLUCOSE 110* 146* 122*  --   --   BUN '20 20 20  '$ --   --   CREATININE 1.17* 0.87 0.78 0.84 0.75  CALCIUM 8.7* 8.1* 8.1*  --   --   MG 1.6*  --  2.2  --   --    GFR: Estimated Creatinine Clearance: 51.2 mL/min (by C-G formula based on SCr of 0.75 mg/dL). Liver Function Tests: Recent Labs  Lab 06/29/22 2118  AST 26  ALT 20  ALKPHOS 73  BILITOT 0.8  PROT 6.7  ALBUMIN 3.5   No results for input(s): "LIPASE", "AMYLASE" in the last 168 hours. No results for input(s): "AMMONIA" in the last 168 hours. Coagulation Profile: Recent Labs  Lab 06/30/22 0526  INR 1.1   Cardiac Enzymes: No results for input(s): "CKTOTAL", "CKMB", "CKMBINDEX", "TROPONINI" in the last 168 hours. BNP (last 3 results) No results for input(s): "PROBNP" in the last 8760 hours. HbA1C: No results for input(s): "HGBA1C" in the last 72 hours. CBG: Recent Labs  Lab 07/02/22 2328  GLUCAP 107*   Lipid Profile: No results for input(s): "CHOL", "HDL", "LDLCALC", "TRIG", "CHOLHDL", "LDLDIRECT" in the last 72 hours. Thyroid Function Tests: Recent Labs     07/01/22 0421  TSH 0.229*  FREET4 0.97   Anemia Panel: No results for input(s): "VITAMINB12", "FOLATE", "FERRITIN", "TIBC", "IRON", "RETICCTPCT"  in the last 72 hours. Urine analysis:    Component Value Date/Time   COLORURINE YELLOW (A) 06/29/2022 2358   APPEARANCEUR HAZY (A) 06/29/2022 2358   LABSPEC 1.017 06/29/2022 2358   PHURINE 5.0 06/29/2022 2358   GLUCOSEU 50 (A) 06/29/2022 2358   HGBUR MODERATE (A) 06/29/2022 2358   BILIRUBINUR NEGATIVE 06/29/2022 2358   KETONESUR 5 (A) 06/29/2022 2358   PROTEINUR 30 (A) 06/29/2022 2358   NITRITE NEGATIVE 06/29/2022 2358   LEUKOCYTESUR NEGATIVE 06/29/2022 2358   Sepsis Labs: '@LABRCNTIP'$ (procalcitonin:4,lacticidven:4)  Recent Results (from the past 240 hour(s))  Blood culture (routine x 2)     Status: Abnormal   Collection Time: 06/29/22  7:53 PM   Specimen: BLOOD LEFT FOREARM  Result Value Ref Range Status   Specimen Description   Final    BLOOD LEFT FOREARM Performed at [facility], [place] [address], [place], [place] [address]    Special Requests   Final    IN PEDIATRIC BOTTLE Blood Culture adequate volume Performed at [facility], [address] [place], [place] [address]    Culture  Setup Time   Final    GRAM POSITIVE RODS IN PEDIATRIC BOTTLE CRITICAL RESULT CALLED TO, READ BACK BY AND VERIFIED WITH: WILL ANDERSON ON 07/01/22 AT 1249 QSD Performed at [facility], [address] [place], [place] [address]    Culture (A)  Final    DIPHTHEROIDS(CORYNEBACTERIUM SPECIES) Standardized susceptibility testing for this organism is not available. Performed at [facility], [place] [address], [place], [place] [address]    Report Status 07/02/2022 FINAL  Final  SARS Coronavirus 2 by RT PCR (hospital order, performed in [facility] hospital lab) *cepheid single result test* Anterior Nasal Swab     Status: None   Collection Time: 06/29/22  7:53 PM   Specimen: Anterior Nasal Swab  Result Value Ref  Range Status   SARS Coronavirus 2 by RT PCR NEGATIVE NEGATIVE Final    Comment: (NOTE) SARS-CoV-2 target nucleic acids are NOT DETECTED.  The SARS-CoV-2 RNA is generally detectable in upper and lower respiratory specimens during the acute phase of infection. The lowest concentration of SARS-CoV-2 viral copies this assay can detect is 250 copies / mL. A negative result does not preclude SARS-CoV-2 infection and should not be used as the sole basis for treatment or other patient management decisions.  A negative result may occur with improper specimen collection / handling, submission of specimen other than nasopharyngeal swab, presence of viral mutation(s) within the areas targeted by this assay, and inadequate number of viral copies (<250 copies / mL). A negative result must be combined with clinical observations, patient history, and epidemiological information.  Fact Sheet for Patients:   https://www.patel.info/  Fact Sheet for Healthcare Providers: https://hall.com/  This test is not yet approved or  cleared by the [place] FDA and has been authorized for detection and/or diagnosis of SARS-CoV-2 by FDA under an Emergency Use Authorization (EUA).  This EUA will remain in effect (meaning this test can be used) for the duration of the COVID-19 declaration under Section 564(b)(1) of the Act, 21 U.S.C. section 360bbb-3(b)(1), unless the authorization is terminated or revoked sooner.  Performed at [facility], [address] [place], [place] [address]   Culture, blood (Routine X 2) w Reflex to ID Panel     Status: None (Preliminary result)   Collection Time: 06/29/22  9:20 PM   Specimen: BLOOD  Result Value Ref Range Status  Specimen Description BLOOD BLOOD LEFT FOREARM  Final   Special Requests   Final    BOTTLES DRAWN AEROBIC AND ANAEROBIC Blood Culture adequate volume   Culture   Final    NO GROWTH 4 DAYS Performed  at [facility], [address] [place], [place] [address]    Report Status PENDING  Incomplete  Urine Culture     Status: None   Collection Time: 06/29/22 11:21 PM   Specimen: Urine, Random  Result Value Ref Range Status   Specimen Description   Final    URINE, RANDOM Performed at [facility], [address], [place], [place] [address]    Special Requests   Final    NONE Performed at [facility], [address], [place], [place] [address]    Culture   Final    NO GROWTH Performed at [facility], [place] [address], [place],  [address]    Report Status 07/01/2022 FINAL  Final         Radiology Studies: DG Foot Complete Left  Result Date: 06/29/2022 CLINICAL DATA:  toe discolation EXAM: LEFT FOOT - COMPLETE 3+ VIEW COMPARISON:  None Available. FINDINGS: No cortical erosion or destruction. Cortical irregularity along the head of the second digit proximal phalanx likely old healed fracture. There is no evidence of fracture or dislocation. There is no evidence of arthropathy or other focal bone abnormality. Soft tissues are unremarkable. IMPRESSION: 1. No cortical erosion or destruction to suggest osteomyelitis of the left foot. 2. No definite acute displaced fracture or dislocation. Electronically Signed   By: Iven Finn M.D.   On: 06/29/2022 23:30   DG Foot Complete Right  Result Date: 06/29/2022 CLINICAL DATA:  toe discolation EXAM: RIGHT FOOT COMPLETE - 3+ VIEW COMPARISON:  X-ray right foot 05/07/2022 FINDINGS: Query interval development of cortical erosion of the fifth digit distal phalanx. Query prior second, third, fourth toe phalangeal amputation. Chronic cortical irregularity/nonaggressive periosteal reaction along the fourth metatarsal body and neck. No cortical erosion or destruction. There is no evidence of fracture or dislocation. Subcutaneus soft tissue edema. IMPRESSION: Query interval development of cortical erosion of the  fifth digit distal phalanx. Consider MRI (with contrast if GFR greater than 30) for further evaluation of possible underlying osteomyelitis. Electronically Signed   By: Iven Finn M.D.   On: 06/29/2022 23:28   CT HEAD WO CONTRAST (5MM)  Result Date: 06/29/2022 CLINICAL DATA:  Mental status change, unknown cause EXAM: CT HEAD WITHOUT CONTRAST TECHNIQUE: Contiguous axial images were obtained from the base of the skull through the vertex without intravenous contrast. RADIATION DOSE REDUCTION: This exam was performed according to the departmental dose-optimization program which includes automated exposure control, adjustment of the mA and/or kV according to patient size and/or use of iterative reconstruction technique. COMPARISON:  Head CT 05/07/2022 FINDINGS: Brain: No intracranial hemorrhage, mass effect, or midline shift. Stable degree of atrophy. No hydrocephalus. The basilar cisterns are patent. Stable periventricular and deep white matter hypodensity typical of chronic small vessel ischemic change. No evidence of territorial infarct or acute ischemia. No extra-axial or intracranial fluid collection. Vascular: Atherosclerosis of skullbase vasculature without hyperdense vessel or abnormal calcification. Skull: No fracture or focal lesion. Sinuses/Orbits: No acute findings. The paranasal sinuses are clear. No mastoid effusion. Bilateral cataract resection. Other: None. IMPRESSION: 1. No acute intracranial abnormality. 2. Stable atrophy and chronic small vessel ischemic change. Electronically Signed   By: Keith Rake M.D.   On: 06/29/2022 22:08  US Venous Img Lower Unilateral Right  Result Date: 06/29/2022 CLINICAL DATA:  Initial evaluation for acute leg swelling. EXAM: RIGHT LOWER EXTREMITY VENOUS DOPPLER ULTRASOUND TECHNIQUE: Gray-scale sonography with graded compression, as well as color Doppler and duplex ultrasound were performed to evaluate the lower extremity deep venous systems from the  level of the common femoral vein and including the common femoral, femoral, profunda femoral, popliteal and calf veins including the posterior tibial, peroneal and gastrocnemius veins when visible. The superficial great saphenous vein was also interrogated. Spectral Doppler was utilized to evaluate flow at rest and with distal augmentation maneuvers in the common femoral, femoral and popliteal veins. COMPARISON:  None Available. FINDINGS: Contralateral Common Femoral Vein: Respiratory phasicity is normal and symmetric with the symptomatic side. No evidence of thrombus. Normal compressibility. Common Femoral Vein: No evidence of thrombus. Normal compressibility, respiratory phasicity and response to augmentation. Saphenofemoral Junction: No evidence of thrombus. Normal compressibility and flow on color Doppler imaging. Profunda Femoral Vein: No evidence of thrombus. Normal compressibility and flow on color Doppler imaging. Femoral Vein: No evidence of thrombus. Normal compressibility, respiratory phasicity and response to augmentation. Popliteal Vein: No evidence of thrombus. Normal compressibility, respiratory phasicity and response to augmentation. Calf Veins: No evidence of thrombus. Normal compressibility and flow on color Doppler imaging. Superficial Great Saphenous Vein: No evidence of thrombus. Normal compressibility. Venous Reflux:  None. Other Findings:  None. IMPRESSION: No evidence of deep venous thrombosis. Electronically Signed   By: Jeannine Boga M.D.   On: 06/29/2022 21:44   DG Chest Portable 1 View  Result Date: 06/29/2022 CLINICAL DATA:  Shortness of breath. EXAM: PORTABLE CHEST 1 VIEW COMPARISON:  Radiograph 05/07/2022 FINDINGS: The lungs are hyperinflated. There is increased bronchial thickening from prior exam. The heart is normal in size with normal mediastinal contours. Aortic atherosclerosis. No acute airspace disease. No pneumothorax, large pleural effusion, or evidence of pulmonary  edema. Chronic change of the right shoulder. IMPRESSION: Increased bronchial thickening from prior exam, may be acute bronchitis or COPD exacerbation. Electronically Signed   By: Keith Rake M.D.   On: 06/29/2022 20:08            LOS: 4 days       Emeterio Reeve, DO [facility] 07/03/2022, 3:19 PM   Staff may message me via secure chat in [place]  but this may not receive immediate response,  please page for urgent matters!  If 7PM-7AM, please contact night-coverage [email or web address]  Dictation software was used to generate the above note. Typos may occur and escape review, as with typed/written notes. Please contact Dr Sheppard Coil directly for clarity if needed.

## 2022-07-03 NOTE — Care Management Important Message (Signed)
Important Message  Patient Details  Name: [name] MRN: [id number] Date of Birth: [birth year]   Medicare Important Message Given:  Yes     Dannette Barbara 07/03/2022, 11:23 AM

## 2022-07-03 NOTE — Interval H&P Note (Signed)
History and Physical Interval Note:  07/03/2022 12:55 PM  [name]  has presented today for surgery, with the diagnosis of osteomyelitis.  The various methods of treatment have been discussed with the patient and family. After consideration of risks, benefits and other options for treatment, the patient has consented to  Procedure(s): Lower Extremity Angiography (Right) as a surgical intervention.  The patient's history has been reviewed, patient examined, no change in status, stable for surgery.  I have reviewed the patient's chart and labs.  Questions were answered to the patient's satisfaction.     [name]

## 2022-07-03 NOTE — Op Note (Signed)
[facility]  Percutaneous Study/Intervention Procedural Note   Date of Surgery: 07/03/2022  Surgeon(s):Wyatte Dames    Assistants:none  Pre-operative Diagnosis: PAD with gangrenous changes right foot and rest pain  Post-operative diagnosis:  Same  Procedure(s) Performed:             1.  Ultrasound guidance for vascular access left femoral artery             2.  Catheter placement into right common femoral artery from left femoral approach             3.  Aortogram and selective right lower extremity angiogram             4.  Percutaneous transluminal angioplasty of the right anterior tibial artery with 2.5 mm diameter by 30 cm length angioplasty balloon and 3 mm diameter by 15 cm length angioplasty balloon             5.  Mechanical thrombectomy using the penumbra CAT 6 device to the right SFA, popliteal, and anterior tibial arteries  6.  Percutaneous transluminal angioplasty of the origin of the right SFA with 4 mm diameter by 6 cm length Lutonix drug-coated angioplasty balloon             7.  StarClose closure device left femoral artery  EBL: 150 cc  Contrast: 50 cc  Fluoro Time: 14.6 minutes  Moderate Conscious Sedation Time: approximately 59 minutes using 1 mg of Versed and 50 mcg of Fentanyl              Indications:  Patient is a 76 y.o.female with gangrenous changes to multiple toes on the right foot with mild surrounding erythema and rest pain of the right foot and a long history of PAD with multiple previous interventions. The patient is brought in for angiography for further evaluation and potential treatment.  Due to the limb threatening nature of the situation, angiogram was performed for attempted limb salvage. The patient is aware that if the procedure fails, amputation would be expected.  The patient also understands that even with successful revascularization, amputation may still be required due to the severity of the situation.  Risks and benefits  are discussed and informed consent is obtained.   Procedure:  The patient was identified and appropriate procedural time out was performed.  The patient was then placed supine on the table and prepped and draped in the usual sterile fashion. Moderate conscious sedation was administered during a face to face encounter with the patient throughout the procedure with my supervision of the RN administering medicines and monitoring the patient's vital signs, pulse oximetry, telemetry and mental status throughout from the start of the procedure until the patient was taken to the recovery room. Ultrasound was used to evaluate the left common femoral artery.  It was patent .  A digital ultrasound image was acquired.  A Seldinger needle was used to access the left common femoral artery under direct ultrasound guidance and a permanent image was performed.  A 0.035 J wire was advanced without resistance and a 5Fr sheath was placed.  Pigtail catheter was placed into the aorta and an AP aortogram was performed. This demonstrated normal renal arteries and normal aorta and iliac segments without significant stenosis. I then crossed the aortic bifurcation and advanced to the right femoral head. Selective right lower extremity angiogram was then performed. This demonstrated that the right common femoral artery and profunda femoris artery are small but patent.  There is a near flush occlusion of the right SFA just above the previously placed stents with occlusion throughout the SFA popliteal and the proximal portion of all 3 tibial vessels.  The stents are down into the anterior tibial artery and they remain occluded.  There is reconstitution of the mid to distal anterior tibial artery well below the stents.  The posterior tibial artery also reconstitutes in the midsegment through collaterals and helps provide flow to the foot.. It was felt that it was in the patient's best interest to proceed with intervention after these images to  avoid a second procedure and a larger amount of contrast and fluoroscopy based off of the findings from the initial angiogram. The patient was systemically heparinized and a 6 [place] [employer] sheath was then placed over the [employer] wire. I then used a Kumpe catheter and the advantage wire to navigate into the occluded SFA and popliteal as well as anterior tibial stents and then exchanged for a Nava cross catheter and a V18 wire were I was able to cross into the anterior tibial artery and confirm intraluminal flow distally.  The anterior tibial was occluded and provided runoff of 2.5 mm diameter by 30 cm length angioplasty balloon was inflated from the anterior tibial artery at the ankle up to the previously placed stent in the anterior tibial artery.  He was taken to 8 atm for 1 minute.  Mechanical thrombectomy was then performed with about a half dozen passes with the penumbra CAT 6 device throughout the right SFA, popliteal artery, and anterior tibial artery down to the proximal to mid segment.  Several large chunks of thrombus were removed and following this, the stented area was actually patent with less than 20% residual stenosis.  Just above the previously placed stent in the most proximal SFA was about a 65 to 70% stenosis.  The anterior tibial artery still had a greater than 60% stenosis in the proximal to mid segment even after previous ballooning.  The anterior tibial artery was treated with a 3 mm diameter by 15 cm length angioplasty balloon inflated to 6 atm for 1 minute.  The proximal SFA was treated with a 4 mm diameter by 6 cm length Lutonix drug-coated angioplasty balloon inflated to 8 atm for 1 minute.  Intra-arterial nitroglycerin was given for vasospasm.  Completion imaging showed only about a 20% residual stenosis in the proximal SFA and about a 25% residual stenosis in the anterior tibial artery although significant vasospasm was seen distally, there is now flow into the foot. I  elected to terminate the procedure. The sheath was removed and StarClose closure device was deployed in the left femoral artery with excellent hemostatic result. The patient was taken to the recovery room in stable condition having tolerated the procedure well.  Findings:               Aortogram:  This demonstrated normal renal arteries and normal aorta and iliac segments without significant stenosis.             Right lower Extremity: Right common femoral artery and profunda femoris artery are small but patent.  There is a near flush occlusion of the right SFA just above the previously placed stents with occlusion throughout the SFA popliteal and the proximal portion of all 3 tibial vessels.  The stents are down into the anterior tibial artery and they remain occluded.  There is reconstitution of the mid to distal anterior tibial artery well below the stents.  The posterior tibial artery also reconstitutes in the midsegment through collaterals and helps provide flow to the foot.   Disposition: Patient was taken to the recovery room in stable condition having tolerated the procedure well.  Complications: None  Leotis Pain 07/03/2022 3:07 PM   This note was created with Dragon Medical transcription system. Any errors in dictation are purely unintentional.

## 2022-07-03 NOTE — TOC Initial Note (Signed)
Transition of Care [facility]) - Initial/Assessment Note    Patient Details  Name: [name] MRN: [id number] Date of Birth: May 09, 1946  Transition of Care [facility]) CM/SW Contact:    Beverly Sessions, RN Phone Number: 07/03/2022, 3:07 PM  Clinical Narrative:                  Patient off the floor for angiogram.  Patient with high risk for readmission score, and home health recommendations.  TOC to follow up       Patient Goals and CMS Choice        Expected Discharge Plan and Services                                                Prior Living Arrangements/Services                       Activities of Daily Living Home Assistive Devices/Equipment: Oxygen, Eyeglasses, Walker (specify type), Bedside commode/3-in-1, Cane (specify quad or straight) ADL Screening (condition at time of admission) Patient's cognitive ability adequate to safely complete daily activities?: Yes Is the patient deaf or have difficulty hearing?: Yes Does the patient have difficulty seeing, even when wearing glasses/contacts?: Yes Does the patient have difficulty concentrating, remembering, or making decisions?: No Patient able to express need for assistance with ADLs?: Yes Does the patient have difficulty dressing or bathing?: Yes Independently performs ADLs?: No Communication: Independent Dressing (OT): Independent Grooming: Needs assistance Is this a change from baseline?: Pre-admission baseline Feeding: Independent Bathing: Needs assistance Is this a change from baseline?: Pre-admission baseline Toileting: Independent with device (comment) In/Out Bed: Needs assistance Is this a change from baseline?: Pre-admission baseline Walks in Home: Independent with device (comment) Does the patient have difficulty walking or climbing stairs?: Yes Weakness of Legs: Both Weakness of Arms/Hands: None  Permission Sought/Granted                  Emotional Assessment               Admission diagnosis:  Hypokalemia [E87.6] Hypomagnesemia [E83.42] COPD exacerbation (HCC) [J44.1] Acute on chronic respiratory failure with hypoxia ([place]) [J96.21] Altered mental status, unspecified altered mental status type [R41.82] Sepsis, due to unspecified organism, unspecified whether acute organ dysfunction present [facility]) [A41.9] Patient Active Problem List   Diagnosis Date Noted   Chest pain 07/02/2022   Acute on chronic respiratory failure with hypoxia ([place]) 06/30/2022   Osteomyelitis of fifth toe of right foot ([place]) 06/30/2022   Dyslipidemia 06/30/2022   Subacute osteomyelitis of right foot ([place])    Essential hypertension    Protein-calorie malnutrition, severe 11/21/2021   COPD with acute exacerbation ([place]) 11/21/2021   COPD exacerbation ([place]) 11/20/2021   Chronic pain - multiple sites arthritis 11/20/2021   Benzodiazepine dependence, continuous ([place]) 11/20/2021   Peripheral vascular disease ([place]) 11/20/2021   Hyponatremia 11/20/2021   Hypocalcemia 11/20/2021   Hypoalbuminemia due to protein-calorie malnutrition ([place]) 11/20/2021   Anemia 11/20/2021   Acute respiratory failure with hypoxemia ([place])    Benzodiazepine withdrawal without complication (HCC)    Malnutrition of moderate degree 10/20/2021   Absent pedal pulses 10/19/2021   Chronic hip pain (Left) 02/17/2021   Hip pain, acute (Left) 02/17/2021   Chronic use of opiate for therapeutic purpose 02/16/2021   Closed fracture of hip, sequela (Left) 02/07/2021  Fracture of femoral neck, left, closed ([place]) 01/11/2021   Chronic respiratory failure with hypoxia (HCC) 01/11/2021   Hypokalemia 01/11/2021   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 ([place]) 12/22/2020   PAD (peripheral artery disease) ([place]) 12/22/2020   Encounter for long-term opiate analgesic use 11/11/2020   Dry gangrene (HCC) of middle toe, right foot 11/11/2020   Foot pain, right 11/11/2020   Hard of hearing 11/11/2020   Atherosclerosis of native  arteries of the extremities with gangrene ([place]) 12/09/2019   Vitamin D deficiency 11/12/2019   Pharmacologic therapy 06/04/2019   Disorder of skeletal system 06/04/2019   Problems influencing health status 06/04/2019   Compression fracture of L3 vertebra (HCC) 08/19/2018   Chronic hip pain (Bilateral) 12/31/2017   Neurogenic pain 08/29/2017   Chronic low back pain (1ry area of Pain) (Right) w/o sciatica 08/29/2017   Chronic sacroiliac joint pain (Right) 06/05/2017   Vitamin D insufficiency 03/12/2017   Right hip pain 02/20/2017   Intertrochanteric fracture of right hip, sequela 02/20/2017   B12 deficiency 02/05/2017   Pressure injury of skin 10/02/2016   Closed displaced intertrochanteric fracture of right femur ([place]) 10/02/2016   Hip fracture ([place]) 10/01/2016   Cervical facet hypertrophy (Bilateral) 05/27/2016   History of shoulder surgery 5 (Right) 05/27/2016   Lumbar foraminal stenosis (L3-4) (Left) 05/27/2016   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) 05/27/2016   Lumbar facet hypertrophy (Bilateral) 05/27/2016   Lumbar facet syndrome (Right) 05/27/2016   Lumbar grade 1 Anterolisthesis of L3 over L4 05/27/2016   Chronic shoulder pain (Bilateral) (status post multiple surgeries) (R>L) 12/08/2015   Substance use disorder Risk: High 09/14/2015   Chronic pain syndrome 09/14/2015   Cervical spondylosis 09/14/2015   Chronic neck pain (2ry area of Pain) (Bilateral) (R>L) 09/14/2015   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) 09/14/2015   Cervical facet syndrome (Bilateral) (R>L) 09/14/2015   Cervical myofascial pain syndrome 09/14/2015   Lumbar spondylosis 09/14/2015   Chronic shoulder impingement syndrome (Right) 09/14/2015   Hypomagnesemia 09/14/2015   CRP elevated 09/14/2015   Elevated sedimentation rate 09/14/2015   Chronic obstructive pulmonary disease (COPD) ([place]) 09/14/2015   Nicotine dependence 09/14/2015   Chronic shoulder pain  (Right) 09/14/2015   Abnormal nerve conduction studies 09/14/2015   Encounter for therapeutic drug level monitoring 09/09/2015   Long term current use of opiate analgesic 09/09/2015   Long term prescription opiate use 05/39/7673   Uncomplicated opioid dependence ([place]) 09/09/2015   Opiate use 09/09/2015   Anxiety and depression 07/11/2012   Tobacco use disorder 05/27/2011   Fatigue 05/27/2011   Hypothyroidism 11/25/2010   HLD (hyperlipidemia) 08/24/2010   Tachycardia 08/24/2010   DYSPNEA 08/24/2010   PCP:  Albina Billet, MD Pharmacy:   [place], [place] - [address] [place] [address] Phone: [phone] Fax: [phone]  [id number] [place] [address]: [phone]: [address]: [phone]: [phone]    Social Determinants of Health (SDOH) Interventions    Readmission Risk Interventions    11/25/2021    9:50 AM 01/14/2021    8:49 AM  Readmission Risk Prevention Plan  Transportation Screening Complete   PCP or Specialist Appt within 3-5 Days  Complete  HRI or Home Care Consult  Complete  Social Work Consult for [place] Planning/Counseling  Complete  Palliative Care Screening  Not Applicable  Medication Review Press photographer) Complete Complete  PCP or Specialist appointment within 3-5 days of discharge Complete   HRI or [place] Complete   [place] Not Applicable

## 2022-07-03 NOTE — Progress Notes (Signed)
Physical Therapy Treatment Patient Details Name: [name] MRN: [id number] DOB: September 16, 1946 Today's Date: 07/03/2022   History of Present Illness Pt is a 76 year old female presenting to the ED with acute onset of worsening dyspnea with associated cough productive of yellow sputum as well as wheezing over the last 4 to 5 days. Admitted with COPD exacerbation, osteomyelitis of fifth toe of right foot, and increased RLE pain 2/2 chronic ischemia of RLE.    PT Comments    Pt stated she is awaiting procedure today.  Declined gait but does agree to supine AROM with encouragement.  Does report some cramping B post Le's today.    Recommendations for follow up therapy are one component of a multi-disciplinary discharge planning process, led by the attending physician.  Recommendations may be updated based on patient status, additional functional criteria and insurance authorization.  Follow Up Recommendations  Home health PT     Assistance Recommended at Discharge Intermittent Supervision/Assistance  Patient can return home with the following Assist for transportation;Direct supervision/assist for medications management;Assistance with cooking/housework   Equipment Recommendations       Recommendations for Other Services       Precautions / Restrictions Precautions Precautions: Fall Restrictions Weight Bearing Restrictions: Yes RLE Weight Bearing: Weight bearing as tolerated     Mobility  Bed Mobility               General bed mobility comments: declined    Transfers                        Ambulation/Gait                   Stairs             Wheelchair Mobility    Modified Rankin (Stroke Patients Only)       Balance                                            Cognition Arousal/Alertness: Awake/alert Behavior During Therapy: WFL for tasks assessed/performed Overall Cognitive Status: Within Functional Limits for  tasks assessed                                 General Comments: required encouragement for participation        Exercises Other Exercises Other Exercises: supine AROM x 10    General Comments        Pertinent Vitals/Pain Pain Assessment Pain Assessment: Faces Faces Pain Scale: Hurts whole lot Pain Location: c/o cramping post B LE's Pain Descriptors / Indicators: Aching, Discomfort, Cramping Pain Intervention(s): Limited activity within patient's tolerance, Monitored during session    Home Living                          Prior Function            PT Goals (current goals can now be found in the care plan section) Progress towards PT goals: Progressing toward goals    Frequency    Min 2X/week      PT Plan Current plan remains appropriate    Co-evaluation              AM-PAC PT "6 Clicks" Mobility   Outcome Measure  Help needed turning from your back to your side while in a flat bed without using bedrails?: None Help needed moving from lying on your back to sitting on the side of a flat bed without using bedrails?: None Help needed moving to and from a bed to a chair (including a wheelchair)?: None Help needed standing up from a chair using your arms (e.g., wheelchair or bedside chair)?: A Little Help needed to walk in hospital room?: A Lot Help needed climbing 3-5 steps with a railing? : A Lot 6 Click Score: 19    End of Session Equipment Utilized During Treatment: Oxygen Activity Tolerance: Patient tolerated treatment well Patient left: in bed;with call bell/phone within reach;with bed alarm set Nurse Communication: Mobility status PT Visit Diagnosis: Unsteadiness on feet (R26.81);Difficulty in walking, not elsewhere classified (R26.2);History of falling (Z91.81);Other abnormalities of gait and mobility (R26.89)     Time: 0712-1975 PT Time Calculation (min) (ACUTE ONLY): 9 min  Charges:  $Therapeutic Exercise: 8-22  mins                   [name], PTA 07/03/22, 12:59 PM

## 2022-07-04 ENCOUNTER — Ambulatory Visit: Payer: Medicare HMO | Admitting: Podiatry

## 2022-07-04 ENCOUNTER — Other Ambulatory Visit (HOSPITAL_COMMUNITY): Payer: Self-pay

## 2022-07-04 ENCOUNTER — Encounter: Payer: Self-pay | Admitting: Vascular Surgery

## 2022-07-04 ENCOUNTER — Telehealth (HOSPITAL_COMMUNITY): Payer: Self-pay | Admitting: Pharmacy Technician

## 2022-07-04 DIAGNOSIS — J441 Chronic obstructive pulmonary disease with (acute) exacerbation: Secondary | ICD-10-CM | POA: Diagnosis not present

## 2022-07-04 DIAGNOSIS — J9621 Acute and chronic respiratory failure with hypoxia: Secondary | ICD-10-CM | POA: Diagnosis not present

## 2022-07-04 DIAGNOSIS — M869 Osteomyelitis, unspecified: Secondary | ICD-10-CM | POA: Diagnosis not present

## 2022-07-04 DIAGNOSIS — E876 Hypokalemia: Secondary | ICD-10-CM | POA: Diagnosis not present

## 2022-07-04 LAB — BASIC METABOLIC PANEL
Anion gap: 5 (ref 5–15)
BUN: 13 mg/dL (ref 8–23)
CO2: 27 mmol/L (ref 22–32)
Calcium: 7.8 mg/dL — ABNORMAL LOW (ref 8.9–10.3)
Chloride: 108 mmol/L (ref 98–111)
Creatinine, Ser: 0.81 mg/dL (ref 0.44–1.00)
GFR, Estimated: >60 mL/min (ref >60)
Glucose, Bld: 74 mg/dL (ref 70–99)
Potassium: 3.7 mmol/L (ref 3.5–5.1)
Sodium: 140 mmol/L (ref 135–145)

## 2022-07-04 LAB — CBC
HCT: 30.2 % — ABNORMAL LOW (ref 36.0–46.0)
Hemoglobin: 9.6 g/dL — ABNORMAL LOW (ref 12.0–15.0)
MCH: 28.5 pg (ref 26.0–34.0)
MCHC: 31.8 g/dL (ref 30.0–36.0)
MCV: 89.6 fL (ref 80.0–100.0)
Platelets: 219 10*3/uL (ref 150–400)
RBC: 3.37 MIL/uL — ABNORMAL LOW (ref 3.87–5.11)
RDW: 14.4 % (ref 11.5–15.5)
WBC: 8.4 10*3/uL (ref 4.0–10.5)
nRBC: 0 % (ref 0.0–0.2)

## 2022-07-04 LAB — CULTURE, BLOOD (ROUTINE X 2)
Culture: NO GROWTH
Special Requests: ADEQUATE

## 2022-07-04 MED ORDER — SODIUM CHLORIDE 0.9 % IV SOLN
2.0000 g | INTRAVENOUS | Status: DC
  Administered 2022-07-04 – 2022-07-05 (×2): 2 g via INTRAVENOUS
  Filled 2022-07-04 (×3): qty 20

## 2022-07-04 NOTE — Progress Notes (Signed)
Mobility Specialist - Progress Note   07/04/22 1200  Mobility  Activity Ambulated with assistance in room;Transferred from bed to chair;Transferred from chair to bed  Level of Assistance Standby assist, set-up cues, supervision of patient - no hands on  Assistive Device Front wheel walker  Distance Ambulated (ft) 25 ft  Activity Response Tolerated well  $Mobility charge 1 Mobility     Pre-mobility: 71 HR, 93% SpO2 Post-mobility: 73 HR, 92% SpO2   Pt lying in bed upon arrival, utilizing 2L. Pt voiced pain in B feet but did not specify severity of pain; agreeable to activity however. Pt completed bed mobility modI and ambulated in room with supervision. SOB on exertion. Anxious. Pt returned to chair for bed linen change then back to bed per pt request. Pt left semi-supine with alarm set, needs in reach.    Kathee Delton Mobility Specialist 07/04/22, 12:21 PM

## 2022-07-04 NOTE — Progress Notes (Signed)
Pharmacy Antibiotic Note  [name] is a 76 y.o. female with history of CAD, COPD, HTN admitted on 06/29/2022 with  osteomyelitis of fifth toe of right foot . XRay and MRI of right foot positive for acute osteomyelitis of the fifth toe. 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 Pharmacy has been consulted for Vancomycin dosing.  Assessment: 8/15: Day 6 of abx. Pt is afebrile and VSS. 1 of 4 bottles showing Cornyebacterium spp, though to be a contaminant. Rest of bottles finalized with no growth. Pt has h/o MRSA so vancomycin is necessary to continue.  Current vancomycin 1000 mg IV Q 24 hrs: Goal AUC 400-550. Expected AUC: 560.7 Expected trough: 13.3 TBW 53.4 kg < IBW 54.7 kg   Plan: Continue vancomycin 1000 mg IV q24H Discontinue Zosyn and start ceftriaxone 2 g IV q24H Monitor renal function while on vancomycin  Height: '5\' 4"'$  (162.6 cm) Weight: 53.4 kg (117 lb 11.6 oz) IBW/kg (Calculated) : 54.7  Temp (24hrs), Avg:97.8 F (36.6 C), Min:97.4 F (36.3 C), Max:98.2 F (36.8 C)   Recent Labs  Lab 06/29/22 1953 06/29/22 2118 06/29/22 2118 06/29/22 2156 06/30/22 0526 07/01/22 0421 07/02/22 0447 07/03/22 0433 07/04/22 0434  WBC  --  13.3*  --   --  6.0 11.6*  --   --  8.4  CREATININE  --  1.17*   < >  --  0.87 0.78 0.84 0.75 0.81  LATICACIDVEN 2.5*  --   --  1.3  --   --   --   --   --    < > = values in this interval not displayed.     Estimated Creatinine Clearance: 50.6 mL/min (by C-G formula based on SCr of 0.81 mg/dL).    No Known Allergies  Antimicrobials this admission:  8/11 Cefepime >> x 1 dose 8/10 Flagyl >> x 1 dose 8/10 Vancomycin >>  8/11 Zosyn >> 8/15 8/15 Ceftriaxone >>  Microbiology results: 8/10 BCx: diphtheroids (Cornybacterium spp) 8/10 UCx: NG Thank you for allowing pharmacy to be a part of this patient's care.  Dara Hoyer, PharmD PGY-1 Pharmacy Resident 07/04/2022 3:33 PM

## 2022-07-04 NOTE — Progress Notes (Signed)
PT Cancellation Note  Patient Details Name: [name] MRN: [id number] DOB: 1946-03-12   Cancelled Treatment:    Reason Eval/Treat Not Completed: Patient declined, no reason specified. Pt in bed, eating lunch upon PT arrival. She declined participation in PT session, reporting "I haven't eaten in three days and I just want to eat." Pt also stating "I am begging for pain medicine!" PT notified RN of pt's request for pain medicine. PT will continue to f/u with pt acutely as available and appropriate.    Clearnce Sorrel Laasya Peyton 07/04/2022, 1:48 PM

## 2022-07-04 NOTE — Progress Notes (Signed)
PROGRESS NOTE    [name]   SEG:[id number] DOB: [birth year]  DOA: 06/29/2022 Date of Service: 07/04/22 PCP: Albina Billet, MD     Brief Narrative / Hospital Course:  [name] is a 76 y.o. Caucasian female with medical history significant for CAD, COPD, depression, dyslipidemia, hypertension and hypothyroidism, who presented to the ER 06/29/2022 with acute onset of worsening dyspnea with associated cough productive of yellow sputum as well as wheezing over the last 4 to 5 days.  Also complaining of foot pain and ulceration, 08/10: Tachycardic 126, tachypneic 26, on baseline 2 L [place] at home and was requiring 4 L O2 [place].  Hypokalemia 2.8, creatinine 1.17, troponins flat at 13 and 14, lactic acid 2.5 trended down to 1.3, procalcitonin less than 0.1, leukocytosis 13.3.  CXR concerning for bronchial thickening due to acute bronchitis/COPD exacerbation.  No DVT on ultrasound, CT head no concerns, XR left foot no acute concerns, XR right foot concerning for osteomyelitis.  Patient was started on IV antibiotics with cefepime, Flagyl, vancomycin.  500 mL NS bolus.  IV magnesium sulfate, Solu-Medrol, Xanax.  Admitted to hospitalist service with COPD exacerbation with acute on chronic hypoxic respiratory failure, sepsis due to osteomyelitis fifth toe of right foot to continue on vancomycin and Zosyn and podiatry consult obtained, electrolyte derangement including hypokalemia 08/11: MRI R foot (+)  osteomyelitis of the distal phalanx of the fifth toe. Osteonecrosis involving the second, fourth, and fifth metatarsal heads. Bone marrow edema distal phalanx of the great toe posttraumatic vs early acute osteomyelitis.  Podiatry saw patient: Continue IV ABX, weightbearing as tolerated, ABI ordered, recommending vascular consult and pending vascular studies may need to consider amputation after possible angiography versus sooner if necrosis/sepsis 08/12: vascular surgery planning for angiography Monday 08/14.   Leukocytosis likely due to steroids. SInus tach confirmed on EKG, added BB given HTN. SPO2 at goal on 2 to 4 L . One of 3 BCx (+)likely contaminant.  08/13: SpO2 at goal on 2L, hypertensive and sinus tach, increased metoprolol.  08/14: RLE Angiogram -  thrombectomy right SFA, popliteal, and anterior tibial arteries and balloon angioplasty right anterior tibial artery and right SFA.  08/15: no events. Pending reevaluation by podiatry.     Consultants:  Podiatry Vascular surgery   Procedures: none    Subjective: Patient reports continued foot pain, no CP/SOB.      ASSESSMENT & PLAN:   Principal Problem:   COPD exacerbation ([place]) Active Problems:   Acute on chronic respiratory failure with hypoxia (HCC)   Osteomyelitis of fifth toe of right foot (HCC)   Hypokalemia   Hypomagnesemia   Essential hypertension   Hypothyroidism   Dyslipidemia   Anxiety and depression   COPD exacerbation (HCC) Acute on chronic respiratory failure with hypoxia IV SoluMedrol completed Continue bronchodilator therapy with DuoNebs and mucolytic therapy with Mucinex. Supplemental O2   Osteomyelitis of fifth toe of right foot (HCC) (+) sepsis POA as manifested by leukocytosis, tachycardia and tachypnea. MRI results as per hospital course  IV abx. Pain management S/p angiography 08/14  Podiatry consult, possible amputation   Hypothyroidism Continue Synthroid.  Hypomagnesemia repleted Serial Mg labs  Hypokalemia repleted  Serial BMP  Essential hypertension Continue antihypertensives  Anxiety and depression continue Xanax as well as Lexapro, Wellbutrin   Dyslipidemia Continue statin therapy.  DVT prophylaxis: lovenox Code Status: full Family Communication: none at this time  Disposition Plan / TOC needs: Pending clinical course, if needing amputation may need SNF placement  for rehab Barriers to discharge / significant pending items: IV antibiotics for osteomyelitis/sepsis,  podiatry & vascular surgery recommendations, anticipate discharge later this week, depends if needing amputation              Objective: Vitals:   07/03/22 1838 07/03/22 2009 07/04/22 0006 07/04/22 0859  BP: 104/62 120/61  (!) 139/93  Pulse: (!) 106 86  83  Resp: '18 16  18  '$ Temp: 98.2 F (36.8 C) (!) 97.4 F (36.3 C)  98 F (36.7 C)  TempSrc: Oral Oral  Oral  SpO2: 94% 93% 97% 99%  Weight:      Height:        Intake/Output Summary (Last 24 hours) at 07/04/2022 1511 Last data filed at 07/04/2022 7741 Gross per 24 hour  Intake 377.4 ml  Output 500 ml  Net -122.6 ml   Filed Weights   06/29/22 1947 07/03/22 1248  Weight: 53.4 kg 53.4 kg    Examination:  Constitutional:  VS as above General Appearance: alert, NAD, thin, hard of hearing  Neck: No masses, trachea midline Respiratory: Normal respiratory effort Diminished breath sounds in all lung fields +diffuse wheeze improved but still present Cardiovascular: S1/S2 normal No lower extremity edema Gastrointestinal: No tenderness Musculoskeletal:  No clubbing/cyanosis of digits Grossly symmetrical movement in all extremities See photos below  Neurological: No cranial nerve deficit on limited exam Alert Psychiatric: Fair judgment/insight Anxious mood and affect           Scheduled Medications:   ALPRAZolam  1 mg Oral TID   amLODipine  5 mg Oral Daily   apixaban  5 mg Oral BID   vitamin C  500 mg Oral BID   atorvastatin  10 mg Oral Daily   feeding supplement  237 mL Oral TID BM   ipratropium-albuterol  3 mL Nebulization QID   metoprolol tartrate  50 mg Oral BID   montelukast  10 mg Oral Daily   multivitamin with minerals  1 tablet Oral Daily   polyethylene glycol  17 g Oral Daily   predniSONE  40 mg Oral Q breakfast   zinc sulfate  220 mg Oral Daily    Continuous Infusions:  sodium chloride Stopped (07/03/22 0010)   cefTRIAXone (ROCEPHIN)  IV     vancomycin Stopped (07/04/22 0106)     PRN Medications:  sodium chloride, acetaminophen **OR** acetaminophen, hydrALAZINE, HYDROcodone-acetaminophen, ipratropium-albuterol, ketorolac, LORazepam, magnesium hydroxide, ondansetron **OR** ondansetron (ZOFRAN) IV, traZODone  Antimicrobials:  Anti-infectives (From admission, onward)    Start     Dose/Rate Route Frequency Ordered Stop   07/04/22 1700  cefTRIAXone (ROCEPHIN) 2 g in sodium chloride 0.9 % 100 mL IVPB        2 g 200 mL/hr over 30 Minutes Intravenous Every 24 hours 07/04/22 1235     07/03/22 2200  vancomycin (VANCOCIN) IVPB 1000 mg/200 mL premix        1,000 mg 200 mL/hr over 60 Minutes Intravenous Every 24 hours 07/03/22 1430     07/01/22 1200  vancomycin (VANCOREADY) IVPB 1250 mg/250 mL  Status:  Discontinued        1,250 mg 166.7 mL/hr over 90 Minutes Intravenous Every 48 hours 06/30/22 0224 06/30/22 1100   06/30/22 2200  vancomycin (VANCOREADY) IVPB 750 mg/150 mL  Status:  Discontinued        750 mg 150 mL/hr over 60 Minutes Intravenous Every 24 hours 06/30/22 1100 07/03/22 1430   06/30/22 0600  piperacillin-tazobactam (ZOSYN) IVPB 3.375 g  Status:  Discontinued        3.375 g 12.5 mL/hr over 240 Minutes Intravenous Every 8 hours 06/30/22 0227 07/04/22 1235   06/30/22 0215  vancomycin (VANCOCIN) IVPB 1000 mg/200 mL premix  Status:  Discontinued        1,000 mg 200 mL/hr over 60 Minutes Intravenous  Once 06/30/22 0210 06/30/22 0223   06/30/22 0215  piperacillin-tazobactam (ZOSYN) IVPB 3.375 g  Status:  Discontinued        3.375 g 100 mL/hr over 30 Minutes Intravenous Every 6 hours 06/30/22 0210 06/30/22 0226   06/29/22 2245  ceFEPIme (MAXIPIME) 2 g in sodium chloride 0.9 % 100 mL IVPB        2 g 200 mL/hr over 30 Minutes Intravenous  Once 06/29/22 2242 06/30/22 0159   06/29/22 2245  metroNIDAZOLE (FLAGYL) IVPB 500 mg        500 mg 100 mL/hr over 60 Minutes Intravenous  Once 06/29/22 2242 06/30/22 0042   06/29/22 2245  vancomycin (VANCOCIN) IVPB 1000 mg/200  mL premix        1,000 mg 200 mL/hr over 60 Minutes Intravenous  Once 06/29/22 2242 06/30/22 0120       Data Reviewed: I have personally reviewed following labs and imaging studies  CBC: Recent Labs  Lab 06/29/22 2118 06/30/22 0526 07/01/22 0421 07/04/22 0434  WBC 13.3* 6.0 11.6* 8.4  NEUTROABS 10.7*  --   --   --   HGB 12.1 10.7* 10.7* 9.6*  HCT 37.2 33.2* 33.2* 30.2*  MCV 87.1 86.2 88.3 89.6  PLT 260 240 228 284   Basic Metabolic Panel: Recent Labs  Lab 06/29/22 2118 06/30/22 0526 07/01/22 0421 07/02/22 0447 07/03/22 0433 07/04/22 0434  NA 139 139 139  --   --  140  K 2.8* 4.0 4.9  --   --  3.7  CL 104 108 111  --   --  108  CO2 '26 25 24  '$ --   --  27  GLUCOSE 110* 146* 122*  --   --  74  BUN '20 20 20  '$ --   --  13  CREATININE 1.17* 0.87 0.78 0.84 0.75 0.81  CALCIUM 8.7* 8.1* 8.1*  --   --  7.8*  MG 1.6*  --  2.2  --   --   --    GFR: Estimated Creatinine Clearance: 50.6 mL/min (by C-G formula based on SCr of 0.81 mg/dL). Liver Function Tests: Recent Labs  Lab 06/29/22 2118  AST 26  ALT 20  ALKPHOS 73  BILITOT 0.8  PROT 6.7  ALBUMIN 3.5   No results for input(s): "LIPASE", "AMYLASE" in the last 168 hours. No results for input(s): "AMMONIA" in the last 168 hours. Coagulation Profile: Recent Labs  Lab 06/30/22 0526  INR 1.1   Cardiac Enzymes: No results for input(s): "CKTOTAL", "CKMB", "CKMBINDEX", "TROPONINI" in the last 168 hours. BNP (last 3 results) No results for input(s): "PROBNP" in the last 8760 hours. HbA1C: No results for input(s): "HGBA1C" in the last 72 hours. CBG: Recent Labs  Lab 07/02/22 2328  GLUCAP 107*   Lipid Profile: No results for input(s): "CHOL", "HDL", "LDLCALC", "TRIG", "CHOLHDL", "LDLDIRECT" in the last 72 hours. Thyroid Function Tests: No results for input(s): "TSH", "T4TOTAL", "FREET4", "T3FREE", "THYROIDAB" in the last 72 hours.  Anemia Panel: No results for input(s): "VITAMINB12", "FOLATE", "FERRITIN",  "TIBC", "IRON", "RETICCTPCT" in the last 72 hours. Urine analysis:    Component Value Date/Time   COLORURINE YELLOW (A) 06/29/2022  [place] (A) 06/29/2022 2358   LABSPEC 1.017 06/29/2022 2358   PHURINE 5.0 06/29/2022 2358   GLUCOSEU 50 (A) 06/29/2022 2358   HGBUR MODERATE (A) 06/29/2022 2358   BILIRUBINUR NEGATIVE 06/29/2022 2358   KETONESUR 5 (A) 06/29/2022 2358   PROTEINUR 30 (A) 06/29/2022 2358   NITRITE NEGATIVE 06/29/2022 2358   LEUKOCYTESUR NEGATIVE 06/29/2022 2358   Sepsis Labs: '@LABRCNTIP'$ (procalcitonin:4,lacticidven:4)  Recent Results (from the past 240 hour(s))  Blood culture (routine x 2)     Status: Abnormal   Collection Time: 06/29/22  7:53 PM   Specimen: BLOOD LEFT FOREARM  Result Value Ref Range Status   Specimen Description   Final    BLOOD LEFT FOREARM Performed at [facility], [place] [address] [place], [place] [address]    Special Requests   Final    IN PEDIATRIC BOTTLE Blood Culture adequate volume Performed at [facility], [address] [place], [place] [address]    Culture  Setup Time   Final    GRAM POSITIVE RODS IN PEDIATRIC BOTTLE CRITICAL RESULT CALLED TO, READ BACK BY AND VERIFIED WITH: WILL ANDERSON ON 07/01/22 AT 1249 QSD Performed at [facility], [address] [place], [place] [address]    Culture (A)  Final    DIPHTHEROIDS(CORYNEBACTERIUM SPECIES) Standardized susceptibility testing for this organism is not available. Performed at [facility], [place] [address] [place], [place] [address]    Report Status 07/02/2022 FINAL  Final  SARS Coronavirus 2 by RT PCR (hospital order, performed in [facility] hospital lab) *cepheid single result test* Anterior Nasal Swab     Status: None   Collection Time: 06/29/22  7:53 PM   Specimen: Anterior Nasal Swab  Result Value Ref Range Status   SARS Coronavirus 2 by RT PCR NEGATIVE NEGATIVE Final    Comment: (NOTE) SARS-CoV-2 target nucleic acids are NOT  DETECTED.  The SARS-CoV-2 RNA is generally detectable in upper and lower respiratory specimens during the acute phase of infection. The lowest concentration of SARS-CoV-2 viral copies this assay can detect is 250 copies / mL. A negative result does not preclude SARS-CoV-2 infection and should not be used as the sole basis for treatment or other patient management decisions.  A negative result may occur with improper specimen collection / handling, submission of specimen other than nasopharyngeal swab, presence of viral mutation(s) within the areas targeted by this assay, and inadequate number of viral copies (<250 copies / mL). A negative result must be combined with clinical observations, patient history, and epidemiological information.  Fact Sheet for Patients:   https://www.patel.info/  Fact Sheet for Healthcare Providers: https://hall.com/  This test is not yet approved or  cleared by the [place] FDA and has been authorized for detection and/or diagnosis of SARS-CoV-2 by FDA under an Emergency Use Authorization (EUA).  This EUA will remain in effect (meaning this test can be used) for the duration of the COVID-19 declaration under Section 564(b)(1) of the Act, 21 U.S.C. section 360bbb-3(b)(1), unless the authorization is terminated or revoked sooner.  Performed at [facility], [address] [place], [place] [address]   Culture, blood (Routine X 2) w Reflex to ID Panel     Status: None   Collection Time: 06/29/22  9:20 PM   Specimen: BLOOD  Result Value Ref Range Status   Specimen Description BLOOD BLOOD LEFT FOREARM  Final   Special Requests   Final    BOTTLES DRAWN  AEROBIC AND ANAEROBIC Blood Culture adequate volume   Culture   Final    NO GROWTH 5 DAYS Performed at [facility], [address] [place], [place] [address]    Report Status 07/04/2022 FINAL  Final  Urine Culture     Status: None    Collection Time: 06/29/22 11:21 PM   Specimen: Urine, Random  Result Value Ref Range Status   Specimen Description   Final    URINE, RANDOM Performed at [facility], [address], [place], [place] [address]    Special Requests   Final    NONE Performed at [facility], [address] [place], [place] [address]    Culture   Final    NO GROWTH Performed at [facility], [place] [address], [place],  [address]    Report Status 07/01/2022 FINAL  Final         Radiology Studies: DG Foot Complete Left  Result Date: 06/29/2022 CLINICAL DATA:  toe discolation EXAM: LEFT FOOT - COMPLETE 3+ VIEW COMPARISON:  None Available. FINDINGS: No cortical erosion or destruction. Cortical irregularity along the head of the second digit proximal phalanx likely old healed fracture. There is no evidence of fracture or dislocation. There is no evidence of arthropathy or other focal bone abnormality. Soft tissues are unremarkable. IMPRESSION: 1. No cortical erosion or destruction to suggest osteomyelitis of the left foot. 2. No definite acute displaced fracture or dislocation. Electronically Signed   By: Iven Finn M.D.   On: 06/29/2022 23:30   DG Foot Complete Right  Result Date: 06/29/2022 CLINICAL DATA:  toe discolation EXAM: RIGHT FOOT COMPLETE - 3+ VIEW COMPARISON:  X-ray right foot 05/07/2022 FINDINGS: Query interval development of cortical erosion of the fifth digit distal phalanx. Query prior second, third, fourth toe phalangeal amputation. Chronic cortical irregularity/nonaggressive periosteal reaction along the fourth metatarsal body and neck. No cortical erosion or destruction. There is no evidence of fracture or dislocation. Subcutaneus soft tissue edema. IMPRESSION: Query interval development of cortical erosion of the fifth digit distal phalanx. Consider MRI (with contrast if GFR greater than 30) for further evaluation of possible underlying osteomyelitis.  Electronically Signed   By: Iven Finn M.D.   On: 06/29/2022 23:28   CT HEAD WO CONTRAST (5MM)  Result Date: 06/29/2022 CLINICAL DATA:  Mental status change, unknown cause EXAM: CT HEAD WITHOUT CONTRAST TECHNIQUE: Contiguous axial images were obtained from the base of the skull through the vertex without intravenous contrast. RADIATION DOSE REDUCTION: This exam was performed according to the departmental dose-optimization program which includes automated exposure control, adjustment of the mA and/or kV according to patient size and/or use of iterative reconstruction technique. COMPARISON:  Head CT 05/07/2022 FINDINGS: Brain: No intracranial hemorrhage, mass effect, or midline shift. Stable degree of atrophy. No hydrocephalus. The basilar cisterns are patent. Stable periventricular and deep white matter hypodensity typical of chronic small vessel ischemic change. No evidence of territorial infarct or acute ischemia. No extra-axial or intracranial fluid collection. Vascular: Atherosclerosis of skullbase vasculature without hyperdense vessel or abnormal calcification. Skull: No fracture or focal lesion. Sinuses/Orbits: No acute findings. The paranasal sinuses are clear. No mastoid effusion. Bilateral cataract resection. Other: None. IMPRESSION: 1. No acute intracranial abnormality. 2. Stable atrophy and chronic small vessel ischemic change. Electronically Signed   By: Keith Rake M.D.   On: 06/29/2022 22:08   US Venous Img Lower Unilateral Right  Result Date: 06/29/2022 CLINICAL DATA:  Initial evaluation for acute leg  swelling. EXAM: RIGHT LOWER EXTREMITY VENOUS DOPPLER ULTRASOUND TECHNIQUE: Gray-scale sonography with graded compression, as well as color Doppler and duplex ultrasound were performed to evaluate the lower extremity deep venous systems from the level of the common femoral vein and including the common femoral, femoral, profunda femoral, popliteal and calf veins including the posterior  tibial, peroneal and gastrocnemius veins when visible. The superficial great saphenous vein was also interrogated. Spectral Doppler was utilized to evaluate flow at rest and with distal augmentation maneuvers in the common femoral, femoral and popliteal veins. COMPARISON:  None Available. FINDINGS: Contralateral Common Femoral Vein: Respiratory phasicity is normal and symmetric with the symptomatic side. No evidence of thrombus. Normal compressibility. Common Femoral Vein: No evidence of thrombus. Normal compressibility, respiratory phasicity and response to augmentation. Saphenofemoral Junction: No evidence of thrombus. Normal compressibility and flow on color Doppler imaging. Profunda Femoral Vein: No evidence of thrombus. Normal compressibility and flow on color Doppler imaging. Femoral Vein: No evidence of thrombus. Normal compressibility, respiratory phasicity and response to augmentation. Popliteal Vein: No evidence of thrombus. Normal compressibility, respiratory phasicity and response to augmentation. Calf Veins: No evidence of thrombus. Normal compressibility and flow on color Doppler imaging. Superficial Great Saphenous Vein: No evidence of thrombus. Normal compressibility. Venous Reflux:  None. Other Findings:  None. IMPRESSION: No evidence of deep venous thrombosis. Electronically Signed   By: Jeannine Boga M.D.   On: 06/29/2022 21:44   DG Chest Portable 1 View  Result Date: 06/29/2022 CLINICAL DATA:  Shortness of breath. EXAM: PORTABLE CHEST 1 VIEW COMPARISON:  Radiograph 05/07/2022 FINDINGS: The lungs are hyperinflated. There is increased bronchial thickening from prior exam. The heart is normal in size with normal mediastinal contours. Aortic atherosclerosis. No acute airspace disease. No pneumothorax, large pleural effusion, or evidence of pulmonary edema. Chronic change of the right shoulder. IMPRESSION: Increased bronchial thickening from prior exam, may be acute bronchitis or COPD  exacerbation. Electronically Signed   By: Keith Rake M.D.   On: 06/29/2022 20:08            LOS: 5 days       Emeterio Reeve, DO [facility] 07/04/2022, 3:11 PM   Staff may message me via secure chat in [place]  but this may not receive immediate response,  please page for urgent matters!  If 7PM-7AM, please contact night-coverage [email or web address]  Dictation software was used to generate the above note. Typos may occur and escape review, as with typed/written notes. Please contact Dr Sheppard Coil directly for clarity if needed.

## 2022-07-04 NOTE — Progress Notes (Signed)
PODIATRY CONSULTATION  NAME [name] MRN [id number] DOB 1946/01/17 DOA 06/29/2022   CC: gangrene RT 5th toe. S/p LE angiogram 07/03/2022 Chief Complaint  Patient presents with   Shortness of Breath   Altered Mental Status   Brief narrative: 76 y.o. female PMHx CAD, COPD, depression, HTN admitted 06/29/2022 for worsening dyspnea with associated productive cough.  Upon presentation also found to have worsening right foot swelling.  X-rays consistent with development of osteomyelitis right fifth toe.  Prior history of digital amputations 2, 3, 4 RT foot.  Podiatry was consulted and patient underwent lower extremity angiogram yesterday, 07/03/2022.  Past Medical History:  Diagnosis Date   Anxiety    CAD (coronary artery disease)    Carpal tunnel syndrome    Cataract    Complication of anesthesia    has woken up at times   COPD (chronic obstructive pulmonary disease) (HCC)    CRP elevated 09/14/2015   Depression    Difficult intubation    Dyslipidemia    Dyspnea    DOE   Dysrhythmia    hx palpatations   Elevated sedimentation rate 09/14/2015   Esophageal spasm    Gastrointestinal parasites    GERD (gastroesophageal reflux disease)    HCAP (healthcare-associated pneumonia) 10/22/2018   Hiatal hernia    History of peptic ulcer disease    Hypertension    Hyperthyroidism    Hypothyroidism    Low magnesium levels 09/14/2015   Pelvic fracture ([place]) 2008   fall from riding a horse   Peripheral vascular disease (HCC)    Reflux    Rotator cuff injury    Sepsis ([place]) 10/22/2018   Stenosis, spinal, lumbar        Latest Ref Rng & Units 07/04/2022    4:34 AM 07/01/2022    4:21 AM 06/30/2022    5:26 AM  CBC  WBC 4.0 - 10.5 K/uL 8.4  11.6  6.0   Hemoglobin 12.0 - 15.0 g/dL 9.6  10.7  10.7   Hematocrit 36.0 - 46.0 % 30.2  33.2  33.2   Platelets 150 - 400 K/uL 219  228  240        Latest Ref Rng & Units 07/04/2022    4:34 AM 07/03/2022    4:33 AM 07/02/2022    4:47 AM  BMP   Glucose 70 - 99 mg/dL 74     BUN 8 - 23 mg/dL 13     Creatinine 0.44 - 1.00 mg/dL 0.81  0.75  0.84   Sodium 135 - 145 mmol/L 140     Potassium 3.5 - 5.1 mmol/L 3.7     Chloride 98 - 111 mmol/L 108     CO2 22 - 32 mmol/L 27     Calcium 8.9 - 10.3 mg/dL 7.8          Physical Exam: General: The patient is alert and oriented x3 in no acute distress.   Dermatology: Eschar with ulcer noted to the right fifth digit.  Please see above noted photo.  Vascular: Findings: Right lower Extremity: Right common femoral artery and profunda femoris artery are small but patent.  There is a near flush occlusion of the right SFA just above the previously placed stents with occlusion throughout the SFA popliteal and the proximal portion of all 3 tibial vessels.  The stents are down into the anterior tibial artery and they remain occluded.  There is reconstitution of the mid to distal anterior tibial artery well below the  stents.  The posterior tibial artery also reconstitutes in the midsegment through collaterals and helps provide flow to the foot.  Neurological: Light touch and protective threshold intact  Musculoskeletal Exam: Prior amputations of the second third and fourth digits right foot with routine healing  MR FOOT RT W/WO CONTRAST 06/30/2022 IMPRESSION: 1. Acute osteomyelitis of the distal phalanx of the fifth toe. 2. Bone marrow edema and mild enhancement throughout the distal phalanx of the great toe, which could be posttraumatic or represent early acute osteomyelitis. 3. Status post second, third, and fourth toe amputations. 4. Changes of osteonecrosis involving the second, fourth, and fifth metatarsal heads.  ASSESSMENT/PLAN OF CARE No open wounds noted to the right great toe.  Clinically no concern for infection or osteomyelitis.  Acute osteomyelitis distal phalanx fifth toe RT -Discussed findings of osteomyelitis with the patient today.  Patient agrees and is amenable to right fifth  toe amputation.  Risk benefits advantages and disadvantages were explained.  No guarantees were expressed or implied.  Patient understands and would like to proceed.  We will tentatively plan for surgery tomorrow, 07/05/2022, PM.  Pending OR availability -Preoperative orders placed.  Surgery will consist of RT fifth toe amputation -N.p.o. after midnight -Podiatry will continue to follow     Thank you for the consult.  Please contact me directly with any questions or concerns.  Cell [phone]   Edrick Kins, DPM [facility]  Dr. Edrick Kins, DPM    [address], [place] [address]                Office [phone]  Fax [phone]

## 2022-07-04 NOTE — Telephone Encounter (Signed)
Pharmacy Patient Advocate Encounter  Insurance verification completed.    The patient is insured through [employer] Part D   The patient is currently admitted and ran test claims for the following: Anoro Ellipta 62.5-25 mcg/act..  Copays and coinsurance results were relayed to Inpatient clinical team.

## 2022-07-04 NOTE — TOC Benefit Eligibility Note (Signed)
Patient Teacher, English as a foreign language completed.    The patient is currently admitted and upon discharge could be taking Anoro Ellipta 62.5-25 mcg/act.  The current 30 day co-pay is $124.53.   The patient is insured through [place], [place] Patient Advocate Specialist [place] Patient Advocate Team Direct Number: [phone]  Fax: [phone]

## 2022-07-05 ENCOUNTER — Inpatient Hospital Stay: Payer: Medicare HMO | Admitting: Certified Registered Nurse Anesthetist

## 2022-07-05 ENCOUNTER — Other Ambulatory Visit: Payer: Self-pay

## 2022-07-05 ENCOUNTER — Encounter: Payer: Self-pay | Admitting: Family Medicine

## 2022-07-05 ENCOUNTER — Encounter
Admission: EM | Disposition: A | Discharge status: Home Health | Payer: Self-pay | Source: Home / Self Care | Attending: Osteopathic Medicine

## 2022-07-05 ENCOUNTER — Other Ambulatory Visit (HOSPITAL_COMMUNITY): Payer: Self-pay

## 2022-07-05 DIAGNOSIS — M869 Osteomyelitis, unspecified: Secondary | ICD-10-CM | POA: Diagnosis not present

## 2022-07-05 DIAGNOSIS — J441 Chronic obstructive pulmonary disease with (acute) exacerbation: Secondary | ICD-10-CM | POA: Diagnosis not present

## 2022-07-05 DIAGNOSIS — J9621 Acute and chronic respiratory failure with hypoxia: Secondary | ICD-10-CM | POA: Diagnosis not present

## 2022-07-05 DIAGNOSIS — E876 Hypokalemia: Secondary | ICD-10-CM | POA: Diagnosis not present

## 2022-07-05 HISTORY — PX: AMPUTATION TOE: SHX6595

## 2022-07-05 SURGERY — AMPUTATION, TOE
Anesthesia: Monitor Anesthesia Care | Site: Toe | Laterality: Right

## 2022-07-05 MED ORDER — PROPOFOL 500 MG/50ML IV EMUL
INTRAVENOUS | Status: DC | PRN
  Administered 2022-07-05: 100 ug/kg/min via INTRAVENOUS

## 2022-07-05 MED ORDER — TRIPLE ANTIBIOTIC 3.5-400-5000 EX OINT
TOPICAL_OINTMENT | CUTANEOUS | Status: AC
  Filled 2022-07-05: qty 1

## 2022-07-05 MED ORDER — PROPOFOL 10 MG/ML IV BOLUS
INTRAVENOUS | Status: DC | PRN
  Administered 2022-07-05 (×2): 20 mg via INTRAVENOUS
  Administered 2022-07-05: 10 mg via INTRAVENOUS

## 2022-07-05 MED ORDER — LACTATED RINGERS IV SOLN: INTRAVENOUS | Status: DC | PRN

## 2022-07-05 MED ORDER — IPRATROPIUM-ALBUTEROL 20-100 MCG/ACT IN AERS
1.0000 | INHALATION_SPRAY | Freq: Four times a day (QID) | RESPIRATORY_TRACT | Status: DC
Start: 2022-07-05 — End: 2022-07-05

## 2022-07-05 MED ORDER — PROPOFOL 10 MG/ML IV BOLUS
INTRAVENOUS | Status: AC
  Filled 2022-07-05: qty 20

## 2022-07-05 MED ORDER — 0.9 % SODIUM CHLORIDE (POUR BTL) OPTIME
TOPICAL | Status: DC | PRN
  Administered 2022-07-05: 150 mL

## 2022-07-05 MED ORDER — IPRATROPIUM-ALBUTEROL 0.5-2.5 (3) MG/3ML IN SOLN: 3.0000 mL | Freq: Two times a day (BID) | RESPIRATORY_TRACT | Status: DC

## 2022-07-05 MED ORDER — BUPIVACAINE HCL 0.5 % IJ SOLN
INTRAMUSCULAR | Status: DC | PRN
  Administered 2022-07-05: 10 mL

## 2022-07-05 MED ORDER — BUPIVACAINE HCL (PF) 0.5 % IJ SOLN
INTRAMUSCULAR | Status: AC
  Filled 2022-07-05: qty 30

## 2022-07-05 SURGICAL SUPPLY — 32 items
BLADE MED AGGRESSIVE (BLADE) ×2 IMPLANT
BNDG ELASTIC 4X5.8 VLCR STR LF (GAUZE/BANDAGES/DRESSINGS) ×2 IMPLANT
BNDG GAUZE DERMACEA FLUFF (GAUZE/BANDAGES/DRESSINGS) ×2
BNDG GAUZE DERMACEA FLUFF 4 (GAUZE/BANDAGES/DRESSINGS) ×1 IMPLANT
BNDG GZE DERMACEA 4 6PLY (GAUZE/BANDAGES/DRESSINGS) ×1
CNTNR SPEC 2.5X3XGRAD LEK (MISCELLANEOUS) ×1
CONT SPEC 4OZ STER OR WHT (MISCELLANEOUS) ×2
CONT SPEC 4OZ STRL OR WHT (MISCELLANEOUS) ×1
CONTAINER SPEC 2.5X3XGRAD LEK (MISCELLANEOUS) ×1 IMPLANT
ELECT REM PT RETURN 9FT ADLT (ELECTROSURGICAL) ×2
ELECTRODE REM PT RTRN 9FT ADLT (ELECTROSURGICAL) ×1 IMPLANT
GAUZE SPONGE 4X4 12PLY STRL (GAUZE/BANDAGES/DRESSINGS) ×4 IMPLANT
GAUZE XEROFORM 1X8 LF (GAUZE/BANDAGES/DRESSINGS) ×2 IMPLANT
GLOVE BIOGEL PI ORTHO PRO 7.5 (GLOVE) ×2
GLOVE PI ORTHO PRO STRL 7.5 (GLOVE) IMPLANT
GOWN STRL REUS W/ TWL LRG LVL3 (GOWN DISPOSABLE) ×2 IMPLANT
GOWN STRL REUS W/TWL LRG LVL3 (GOWN DISPOSABLE) ×4
KIT TURNOVER KIT A (KITS) ×2 IMPLANT
MANIFOLD NEPTUNE II (INSTRUMENTS) ×2 IMPLANT
NDL HYPO 25X1 1.5 SAFETY (NEEDLE) ×2 IMPLANT
NEEDLE HYPO 25X1 1.5 SAFETY (NEEDLE) ×4 IMPLANT
NS IRRIG 500ML POUR BTL (IV SOLUTION) ×2 IMPLANT
PACK EXTREMITY ARMC (MISCELLANEOUS) ×2 IMPLANT
SOL PREP PVP 2OZ (MISCELLANEOUS) ×2
SOLUTION PREP PVP 2OZ (MISCELLANEOUS) ×1 IMPLANT
STOCKINETTE STRL 6IN 960660 (GAUZE/BANDAGES/DRESSINGS) ×2 IMPLANT
SUT ETHILON 3-0 FS-10 30 BLK (SUTURE) ×2
SUT VIC AB 3-0 SH 27 (SUTURE) ×2
SUT VIC AB 3-0 SH 27X BRD (SUTURE) ×1 IMPLANT
SUTURE EHLN 3-0 FS-10 30 BLK (SUTURE) ×2 IMPLANT
SYR 10ML LL (SYRINGE) ×2 IMPLANT
WATER STERILE IRR 500ML POUR (IV SOLUTION) ×2 IMPLANT

## 2022-07-05 NOTE — TOC Initial Note (Addendum)
Transition of Care [facility]) - Initial/Assessment Note    Patient Details  Name: [name] MRN: [id number] Date of Birth: Oct 04, 1946  Transition of Care [facility]) CM/SW Contact:    Beverly Sessions, RN Phone Number: 07/05/2022, 10:51 AM  Clinical Narrative:                  Admitted for: COPD, and infection of foot  Admitted from: Home alone PCP: Hall Busing Current home health/prior home health/DME:  RW, rollator, shower seat. Patient has O2 states she does not have portable at home.   Confirmed that patient has O2 through [employer], and does have a home fill unit.  Patient will need a portable tanks for discharge Plan for amuptation of 5th toe today  Therapy recommending home health. Patient request [facility].  Referall made to [place] with [facility]in for RN, PT, OT.  She is checking to see if they can accept the patient   Update:  [facility] is unable to accept.  Malachy Mood with [facility] is able to accept the referral.  Patient aware and in agreement        Patient Goals and CMS Choice        Expected Discharge Plan and Services                                                Prior Living Arrangements/Services                       Activities of Daily Living Home Assistive Devices/Equipment: Oxygen, Eyeglasses, Walker (specify type), Bedside commode/3-in-1, Cane (specify quad or straight) ADL Screening (condition at time of admission) Patient's cognitive ability adequate to safely complete daily activities?: Yes Is the patient deaf or have difficulty hearing?: Yes Does the patient have difficulty seeing, even when wearing glasses/contacts?: Yes Does the patient have difficulty concentrating, remembering, or making decisions?: No Patient able to express need for assistance with ADLs?: Yes Does the patient have difficulty dressing or bathing?: Yes Independently performs ADLs?: No Communication: Independent Dressing (OT): Independent Grooming: Needs assistance Is this  a change from baseline?: Pre-admission baseline Feeding: Independent Bathing: Needs assistance Is this a change from baseline?: Pre-admission baseline Toileting: Independent with device (comment) In/Out Bed: Needs assistance Is this a change from baseline?: Pre-admission baseline Walks in Home: Independent with device (comment) Does the patient have difficulty walking or climbing stairs?: Yes Weakness of Legs: Both Weakness of Arms/Hands: None  Permission Sought/Granted                  Emotional Assessment              Admission diagnosis:  Hypokalemia [E87.6] Hypomagnesemia [E83.42] COPD exacerbation (HCC) [J44.1] Acute on chronic respiratory failure with hypoxia ([place]) [J96.21] Altered mental status, unspecified altered mental status type [R41.82] Sepsis, due to unspecified organism, unspecified whether acute organ dysfunction present [facility]) [A41.9] Patient Active Problem List   Diagnosis Date Noted   Acute on chronic respiratory failure with hypoxia ([place]) 06/30/2022   Osteomyelitis of fifth toe of right foot ([place]) 06/30/2022   Dyslipidemia 06/30/2022   Subacute osteomyelitis of right foot ([place])    Essential hypertension    Protein-calorie malnutrition, severe 11/21/2021   COPD with acute exacerbation ([place]) 11/21/2021   COPD exacerbation ([place]) 11/20/2021   Chronic pain - multiple sites arthritis 11/20/2021  Benzodiazepine dependence, continuous ([place]) 11/20/2021   Peripheral vascular disease ([place]) 11/20/2021   Hyponatremia 11/20/2021   Hypocalcemia 11/20/2021   Hypoalbuminemia due to protein-calorie malnutrition ([place]) 11/20/2021   Anemia 11/20/2021   Acute respiratory failure with hypoxemia (HCC)    Benzodiazepine withdrawal without complication (HCC)    Malnutrition of moderate degree 10/20/2021   Absent pedal pulses 10/19/2021   Chronic hip pain (Left) 02/17/2021   Hip pain, acute (Left) 02/17/2021   Chronic use of opiate for therapeutic purpose 02/16/2021    Closed fracture of hip, sequela (Left) 02/07/2021   Fracture of femoral neck, left, closed ([place]) 01/11/2021   Chronic respiratory failure with hypoxia ([place]) 01/11/2021   Hypokalemia 01/11/2021   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 ([place]) 12/22/2020   PAD (peripheral artery disease) ([place]) 12/22/2020   Encounter for long-term opiate analgesic use 11/11/2020   Dry gangrene (HCC) of middle toe, right foot 11/11/2020   Foot pain, right 11/11/2020   Hard of hearing 11/11/2020   Atherosclerosis of native arteries of the extremities with gangrene ([place]) 12/09/2019   Vitamin D deficiency 11/12/2019   Pharmacologic therapy 06/04/2019   Disorder of skeletal system 06/04/2019   Problems influencing health status 06/04/2019   Compression fracture of L3 vertebra (HCC) 08/19/2018   Chronic hip pain (Bilateral) 12/31/2017   Neurogenic pain 08/29/2017   Chronic low back pain (1ry area of Pain) (Right) w/o sciatica 08/29/2017   Chronic sacroiliac joint pain (Right) 06/05/2017   Vitamin D insufficiency 03/12/2017   Right hip pain 02/20/2017   Intertrochanteric fracture of right hip, sequela 02/20/2017   B12 deficiency 02/05/2017   Pressure injury of skin 10/02/2016   Closed displaced intertrochanteric fracture of right femur ([place]) 10/02/2016   Hip fracture ([place]) 10/01/2016   Cervical facet hypertrophy (Bilateral) 05/27/2016   History of shoulder surgery 5 (Right) 05/27/2016   Lumbar foraminal stenosis (L3-4) (Left) 05/27/2016   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) 05/27/2016   Lumbar facet hypertrophy (Bilateral) 05/27/2016   Lumbar facet syndrome (Right) 05/27/2016   Lumbar grade 1 Anterolisthesis of L3 over L4 05/27/2016   Chronic shoulder pain (Bilateral) (status post multiple surgeries) (R>L) 12/08/2015   Substance use disorder Risk: High 09/14/2015   Chronic pain syndrome 09/14/2015   Cervical spondylosis 09/14/2015   Chronic neck pain (2ry area of Pain) (Bilateral) (R>L)  09/14/2015   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) 09/14/2015   Cervical facet syndrome (Bilateral) (R>L) 09/14/2015   Cervical myofascial pain syndrome 09/14/2015   Lumbar spondylosis 09/14/2015   Chronic shoulder impingement syndrome (Right) 09/14/2015   Hypomagnesemia 09/14/2015   CRP elevated 09/14/2015   Elevated sedimentation rate 09/14/2015   Chronic obstructive pulmonary disease (COPD) ([place]) 09/14/2015   Nicotine dependence 09/14/2015   Chronic shoulder pain (Right) 09/14/2015   Abnormal nerve conduction studies 09/14/2015   Encounter for therapeutic drug level monitoring 09/09/2015   Long term current use of opiate analgesic 09/09/2015   Long term prescription opiate use 55/73/2202   Uncomplicated opioid dependence ([place]) 09/09/2015   Opiate use 09/09/2015   Anxiety and depression 07/11/2012   Tobacco use disorder 05/27/2011   Fatigue 05/27/2011   Hypothyroidism 11/25/2010   HLD (hyperlipidemia) 08/24/2010   Tachycardia 08/24/2010   DYSPNEA 08/24/2010   PCP:  Albina Billet, MD Pharmacy:   [place], [place] - [place] [address] [place] [address] Phone: [phone] Fax: [phone]  [id number] [place]  [address] [place] [address] Phone: [phone] Fax: [place], [place] [address] Phone: [phone] Fax: [phone]     Social Determinants of Health (SDOH) Interventions    Readmission Risk Interventions    07/05/2022   10:50 AM 11/25/2021    9:50 AM 01/14/2021    8:49 AM  Readmission Risk Prevention Plan  Transportation Screening Complete Complete   PCP or Specialist Appt within 3-5 Days   Complete  HRI or [place]   Complete  Social Work Consult for [place] Planning/Counseling   Complete  Palliative Care  Screening   Not Applicable  Medication Review Press photographer) Complete Complete Complete  PCP or Specialist appointment within 3-5 days of discharge  Complete   HRI or [place] Complete Complete   SW Recovery Care/Counseling Consult Complete    [place] Not Applicable Not Applicable

## 2022-07-05 NOTE — Transfer of Care (Signed)
Immediate Anesthesia Transfer of Care Note  Patient: [name]  Procedure(s) Performed: AMPUTATION RIGHT 5TH TOE (Right: Toe)  Patient Location: PACU  Anesthesia Type:MAC  Level of Consciousness: awake, alert  and oriented  Airway & Oxygen Therapy: Patient Spontanous Breathing and Patient connected to nasal cannula oxygen  Post-op Assessment: Report given to RN and Post -op Vital signs reviewed and stable  Post vital signs: Reviewed and stable  Last Vitals:  Vitals Value Taken Time  BP 100/77 07/05/22 1308  Temp    Pulse 66 07/05/22 1311  Resp 12 07/05/22 1311  SpO2 97 % 07/05/22 1311  Vitals shown include unvalidated device data.  Last Pain:  Vitals:   07/05/22 1131  TempSrc: Oral  PainSc: 8       Patients Stated Pain Goal: 0 (56/25/63 8937)  Complications: No notable events documented.

## 2022-07-05 NOTE — H&P (Signed)
History and Physical Interval Note:  07/05/2022 12:22 PM  [name]  has presented today for surgery, with the diagnosis of osteomyelitis.  The various methods of treatment have been discussed with the patient and family. After consideration of risks, benefits and other options for treatment, the patient has consented to   Procedure(s): AMPUTATION RIGHT 5TH TOE (Right) as a surgical intervention.  The patient's history has been reviewed, patient examined, no change in status, stable for surgery.  I have reviewed the patient's chart and labs.  Questions were answered to the patient's satisfaction.     [name]

## 2022-07-05 NOTE — Progress Notes (Signed)
Pt left the floor to go to the OR.

## 2022-07-05 NOTE — Anesthesia Preprocedure Evaluation (Signed)
Anesthesia Evaluation  Patient identified by MRN, date of birth, ID band Patient awake    Reviewed: Allergy & Precautions, NPO status , Patient's Chart, lab work & pertinent test results  History of Anesthesia Complications (+) DIFFICULT AIRWAY and history of anesthetic complications  Airway Mallampati: III  TM Distance: >3 FB Neck ROM: Limited    Dental  (+) Poor Dentition, Missing, Implants, Dental Advidsory Given   Pulmonary shortness of breath, COPD,  COPD inhaler, Recent URI , Residual Cough, Current Smoker and Patient abstained from smoking.,  Supplemental oxygen now  2L [place]  breath sounds clear to auscultation- rhonchi + wheezing      Cardiovascular Exercise Tolerance: Poor hypertension, Pt. on medications (-) angina+ CAD and + Peripheral Vascular Disease  (-) Past MI and (-) CABG + dysrhythmias (hx palpatations)  Rhythm:Regular Rate:Normal - Systolic murmurs and - Diastolic murmurs CAD, peripheral vascular disease status post mechanical thrombectomy and stent angioplasty of the right lower extremity with gangrene of the right toes in the past   Neuro/Psych neg Seizures PSYCHIATRIC DISORDERS Anxiety Depression  Neuromuscular disease    GI/Hepatic Neg liver ROS, hiatal hernia, neg GERD  ,  Endo/Other  neg diabetesHypothyroidism   Renal/GU negative Renal ROS     Musculoskeletal  (+) Arthritis ,   Abdominal (+) - obese,   Peds  Hematology negative hematology ROS (+)   Anesthesia Other Findings Past Medical History: No date: Anxiety No date: CAD (coronary artery disease) No date: Carpal tunnel syndrome No date: Cataract No date: Complication of anesthesia     Comment:  has woken up at times No date: COPD (chronic obstructive pulmonary disease) ([place]) 09/14/2015: CRP elevated No date: Depression No date: Difficult intubation No date: Dyslipidemia No date: Dyspnea     Comment:  DOE No date: Dysrhythmia      Comment:  hx palpatations 09/14/2015: Elevated sedimentation rate No date: Esophageal spasm No date: Gastrointestinal parasites No date: GERD (gastroesophageal reflux disease) No date: Hiatal hernia No date: History of peptic ulcer disease No date: Hypertension No date: Hyperthyroidism No date: Hypothyroidism 09/14/2015: Low magnesium levels 2008: Pelvic fracture ([place])     Comment:  fall from riding a horse No date: Peripheral vascular disease (HCC) No date: Reflux No date: Rotator cuff injury No date: Stenosis, spinal, lumbar   Reproductive/Obstetrics                             Anesthesia Physical  Anesthesia Plan  ASA: 3  Anesthesia Plan: MAC   Post-op Pain Management:    Induction: Intravenous  PONV Risk Score and Plan: 1 and Propofol infusion and TIVA  Airway Management Planned: Natural Airway  Additional Equipment:   Intra-op Plan:   Post-operative Plan:   Informed Consent: I have reviewed the patients History and Physical, chart, labs and discussed the procedure including the risks, benefits and alternatives for the proposed anesthesia with the patient or authorized representative who has indicated his/her understanding and acceptance.     Dental advisory given  Plan Discussed with: CRNA and Anesthesiologist  Anesthesia Plan Comments: (Patient consented for risks of anesthesia including but not limited to:  - adverse reactions to medications - risk of airway placement if required - damage to eyes, teeth, lips or other oral mucosa - nerve damage due to positioning  - sore throat or hoarseness - Damage to heart, brain, nerves, lungs, other parts of body or loss of life  Patient voiced  understanding.)        Anesthesia Quick Evaluation

## 2022-07-05 NOTE — Progress Notes (Signed)
PT Cancellation Note  Patient Details Name: [name] MRN: [id number] DOB: Jul 14, 1946   Cancelled Treatment:    Reason Eval/Treat Not Completed:  Patient declined due to foot surgery pending today. She is agreeable for PT to return tomorrow.   Minna Merritts, PT, MPT  [name] 07/05/2022, 11:42 AM

## 2022-07-05 NOTE — Progress Notes (Signed)
OT Cancellation Note  Patient Details Name: [name] MRN: [id number] DOB: 16-Jun-1946   Cancelled Treatment:    Reason Eval/Treat Not Completed: Other (comment) (pt off floor for surgery. OT will reattempt as able.Shanon Payor, OTD OTR/L  07/05/22, 12:40 PM

## 2022-07-05 NOTE — Anesthesia Postprocedure Evaluation (Signed)
Anesthesia Post Note  Patient: [name]  Procedure(s) Performed: AMPUTATION RIGHT 5TH TOE (Right: Toe)  Patient location during evaluation: PACU Anesthesia Type: MAC Level of consciousness: awake and alert Pain management: pain level controlled Vital Signs Assessment: post-procedure vital signs reviewed and stable Respiratory status: spontaneous breathing, nonlabored ventilation, respiratory function stable and patient connected to nasal cannula oxygen Cardiovascular status: blood pressure returned to baseline and stable Postop Assessment: no apparent nausea or vomiting Anesthetic complications: no   No notable events documented.   Last Vitals:  Vitals:   07/05/22 1315 07/05/22 1343  BP: (!) 121/58 (!) 141/64  Pulse: 64 82  Resp: 15 18  Temp: 36.7 C 36.8 C  SpO2: 97% 94%    Last Pain:  Vitals:   07/05/22 1343  TempSrc: Oral  PainSc: 0-No pain                 Dimas Millin

## 2022-07-05 NOTE — Progress Notes (Signed)
Pt is back from the OR. Bedside report received. VSS. Pt denies pain at this time.

## 2022-07-05 NOTE — Progress Notes (Signed)
 Vein and Vascular Surgery  Daily Progress Note   Subjective  -   Patient still having some discomfort in her foot post angiogram.   Objective Vitals:   07/04/22 1934 07/05/22 0509 07/05/22 0822 07/05/22 0847  BP: (!) 112/59 (!) 164/77 (!) 162/82   Pulse: 93 81 79   Resp: '17 16 18   '$ Temp: 97.9 F (36.6 C) 97.6 F (36.4 C) 98.2 F (36.8 C)   TempSrc: Oral Oral Oral   SpO2: 100% 100% 99% 100%  Weight:      Height:        Intake/Output Summary (Last 24 hours) at 07/05/2022 0943 Last data filed at 07/05/2022 0539 Gross per 24 hour  Intake --  Output 2300 ml  Net -2300 ml    PULM  CTAB CV  RRR VASC  dopplerable DP PT pulses bilaterally  Laboratory CBC    Component Value Date/Time   WBC 8.4 07/04/2022 0434   HGB 9.6 (L) 07/04/2022 0434   HCT 30.2 (L) 07/04/2022 0434   PLT 219 07/04/2022 0434    BMET    Component Value Date/Time   NA 140 07/04/2022 0434   NA 143 10/06/2019 1552   NA 138 02/10/2014 1335   K 3.7 07/04/2022 0434   K 4.5 02/10/2014 1335   CL 108 07/04/2022 0434   CL 106 02/10/2014 1335   CO2 27 07/04/2022 0434   CO2 28 02/10/2014 1335   GLUCOSE 74 07/04/2022 0434   GLUCOSE 99 02/10/2014 1335   BUN 13 07/04/2022 0434   BUN 7 (L) 10/06/2019 1552   BUN 6 (L) 02/10/2014 1335   CREATININE 0.81 07/04/2022 0434   CREATININE 0.89 02/10/2014 1335   CALCIUM 7.8 (L) 07/04/2022 0434   CALCIUM 8.7 02/10/2014 1335   GFRNONAA >60 07/04/2022 0434   GFRNONAA >60 02/10/2014 1335   GFRAA >60 12/18/2019 1012   GFRAA >60 02/10/2014 1335    Assessment/Planning: POD #1 s/p right lower extremity angiogram  Patient continues to have pain in the right lower extremity.  The patient's Aggrastat was not run overnight and she was transitioned to Eliquis.  The patient does have warm foot with dopplerable pulses she should be discharged on Eliquis and aspirin.  We will have the patient return to see [place] in the office in approximately 4 weeks with noninvasive  studies.  Currently no further interventions planned from a vascular perspective.  Late entry Kris Hartmann  07/05/2022, 9:43 AM

## 2022-07-05 NOTE — Progress Notes (Signed)
Pharmacy Antibiotic Note  [name] is a 76 y.o. 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 Pharmacy has been consulted for Vancomycin dosing.  Assessment: 8/16: Day 7 of abx. Pt remains afebrile and VSS. WBCs WNL. Amputation performed today w/ minimal bleeding but healing prognosis is poor. 1 of 4 bottles showing Cornyebacterium spp, thought to be a contaminant. Rest of bottles finalized with no growth. Pt has h/o MRSA so vancomycin is necessary to continue until surgery.  Current vancomycin 1000 mg IV Q 24 hrs: Goal AUC 400-550. Expected AUC: 560.7 Expected trough: 13.3 TBW 53.4 kg < IBW 54.7 kg  Plan: Continue vancomycin 1000 mg IV q24H Continue ceftriaxone 2 g IV q24H Monitor renal function while on vancomycin Will follow-up regarding antibiotic plan, in terms of IV>>PO switch and possible ID consult  Height: '5\' 4"'$  (162.6 cm) Weight: 53.4 kg (117 lb 11.6 oz) IBW/kg (Calculated) : 54.7  Temp (24hrs), Avg:98 F (36.7 C), Min:97.6 F (36.4 C), Max:98.5 F (36.9 C)   Recent Labs  Lab 06/29/22 1953 06/29/22 2118 06/29/22 2118 06/29/22 2156 06/30/22 0526 07/01/22 0421 07/02/22 0447 07/03/22 0433 07/04/22 0434  WBC  --  13.3*  --   --  6.0 11.6*  --   --  8.4  CREATININE  --  1.17*   < >  --  0.87 0.78 0.84 0.75 0.81  LATICACIDVEN 2.5*  --   --  1.3  --   --   --   --   --    < > = values in this interval not displayed.    Estimated Creatinine Clearance: 50.6 mL/min (by C-G formula based on SCr of 0.81 mg/dL).    No Known Allergies  Antimicrobials this admission:  8/11 Cefepime >> x 1 dose 8/10 Flagyl >> x 1 dose 8/10 Vancomycin >>  8/11 Zosyn >> 8/15 8/15 Ceftriaxone >>  Microbiology results: 8/10 BCx: diphtheroids (Cornybacterium spp) 8/10 UCx: NG Thank  you for allowing pharmacy to be a part of this patient's care.  Dara Hoyer, PharmD PGY-1 Pharmacy Resident 07/05/2022 2:50 PM

## 2022-07-05 NOTE — Progress Notes (Signed)
PROGRESS NOTE    [facility]   HYI:[id number] DOB: 1946/04/30  DOA: 06/29/2022 Date of Service: 07/05/22 PCP: Albina Billet, MD     Brief Narrative / Hospital Course:  [name] is a 76 y.o. Caucasian female with medical history significant for CAD, COPD, depression, dyslipidemia, hypertension and hypothyroidism, who presented to the ER 06/29/2022 with acute onset of worsening dyspnea with associated cough productive of yellow sputum as well as wheezing over the last 4 to 5 days.  Also complaining of foot pain and ulceration, 08/10: Tachycardic 126, tachypneic 26, on baseline 2 L [place] at home and was requiring 4 L O2 [place].  Hypokalemia 2.8, creatinine 1.17, troponins flat at 13 and 14, lactic acid 2.5 trended down to 1.3, procalcitonin less than 0.1, leukocytosis 13.3.  CXR concerning for bronchial thickening due to acute bronchitis/COPD exacerbation.  No DVT on ultrasound, CT head no concerns, XR left foot no acute concerns, XR right foot concerning for osteomyelitis.  Patient was started on IV antibiotics with cefepime, Flagyl, vancomycin.  500 mL NS bolus.  IV magnesium sulfate, Solu-Medrol, Xanax.  Admitted to hospitalist service with COPD exacerbation with acute on chronic hypoxic respiratory failure, sepsis due to osteomyelitis fifth toe of right foot to continue on vancomycin and Zosyn and podiatry consult obtained, electrolyte derangement including hypokalemia 08/11: MRI R foot (+) osteomyelitis of the distal phalanx of the fifth toe, osteonecrosis involving the second, fourth, and fifth metatarsal heads. Bone marrow edema distal phalanx of the great toe posttraumatic vs early acute osteomyelitis.  Podiatry consult: Continue IV ABX, weightbearing as tolerated, ABI ordered, recommending vascular consult and pending vascular studies may need to consider amputation after possible angiography versus sooner if necrosis/sepsis 08/12: vascular surgery consult: angiography Monday 08/14.   Leukocytosis likely due to steroids. Sinus tach confirmed on EKG, added BB given HTN. SPO2 at goal on 2 to 4 L [place]. One of 3 BCx (+)likely contaminant.  08/13: SpO2 at goal on 2L, hypertensive and sinus tach, increased metoprolol.  08/14: RLE Angiogram -  thrombectomy right SFA, popliteal, and anterior tibial arteries and balloon angioplasty right anterior tibial artery and right SFA.  08/15: Podiatry: Planning for right fifth toe amputation planned tentatively for tomorrow 08/16: Pending R 5th toe amputation.     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 MRI results as per hospital course  IV abx --> po per podiatry vs may need ID consult  Pain management S/p angiography 08/14  Podiatry consult, planning R 5th toe amputation 07/05/22  Hypothyroidism Continue Synthroid.  Hypomagnesemia repleted Serial Mg labs  Hypokalemia repleted  Serial BMP  Essential hypertension Continue antihypertensives  Anxiety and depression continue Xanax as well as  Lexapro, Wellbutrin   Dyslipidemia Continue statin therapy.  DVT prophylaxis: lovenox Code Status: full Family  Communication: none at this time  Disposition Plan / TOC needs: Pending clinical course, may need SNF placement for rehab, PT to evaluate post-op  Barriers to discharge / significant pending items: IV antibiotics for osteomyelitis/sepsis, podiatry & vascular surgery recommendations, anticipate discharge later this week, depends if needing amputation              Objective: Vitals:   07/04/22 1534 07/04/22 1928 07/04/22 1934 07/05/22 0509  BP: 136/69  (!) 112/59 (!) 164/77  Pulse: 89  93 81  Resp: '18  17 16  '$ Temp: 97.9 F (36.6 C)  97.9 F (36.6 C) 97.6 F (36.4 C)  TempSrc:   Oral Oral  SpO2: 97% 96% 100% 100%  Weight:      Height:        Intake/Output Summary (Last 24 hours) at 07/05/2022 0757 Last data filed at 07/05/2022 0539 Gross per 24 hour  Intake --  Output 2300 ml  Net -2300 ml   Filed Weights   06/29/22 1947 07/03/22 1248  Weight: 53.4 kg 53.4 kg    Examination:  Constitutional:  VS as above General Appearance: alert, NAD, thin, hard of hearing  Neck: No masses, trachea midline Respiratory: Normal respiratory effort Diminished breath sounds in all lung fields +diffuse wheeze improved but still present Cardiovascular: S1/S2 normal No lower extremity edema Gastrointestinal: No tenderness Musculoskeletal:  No clubbing/cyanosis of digits Grossly symmetrical movement in all extremities See photos below  Neurological: No cranial nerve deficit on limited exam Alert Psychiatric: Fair judgment/insight Anxious mood and affect           Scheduled Medications:   ALPRAZolam  1 mg Oral TID   amLODipine  5 mg Oral Daily   apixaban  5 mg Oral BID   vitamin C  500 mg Oral BID   atorvastatin  10 mg Oral Daily   feeding supplement  237 mL Oral TID BM   ipratropium-albuterol  3 mL Nebulization QID   metoprolol tartrate  50 mg Oral BID   montelukast  10 mg Oral Daily   multivitamin with minerals  1 tablet Oral Daily   polyethylene glycol  17  g Oral Daily   predniSONE  40 mg Oral Q breakfast   zinc sulfate  220 mg Oral Daily    Continuous Infusions:  sodium chloride Stopped (07/03/22 0010)   cefTRIAXone (ROCEPHIN)  IV 2 g (07/04/22 1802)   vancomycin 1,000 mg (07/04/22 2137)    PRN Medications:  sodium chloride, acetaminophen **OR** acetaminophen, hydrALAZINE, HYDROcodone-acetaminophen, ipratropium-albuterol, LORazepam, magnesium hydroxide, ondansetron **OR** ondansetron (ZOFRAN) IV, traZODone  Antimicrobials:  Anti-infectives (From admission, onward)    Start     Dose/Rate Route Frequency Ordered Stop   07/04/22 1700  cefTRIAXone (ROCEPHIN) 2 g in sodium chloride 0.9 % 100 mL IVPB        2 g 200 mL/hr over 30 Minutes Intravenous Every 24 hours 07/04/22 1235     07/03/22 2200  vancomycin (VANCOCIN) IVPB 1000 mg/200 mL premix        1,000 mg 200 mL/hr over 60 Minutes Intravenous Every 24 hours 07/03/22 1430     07/01/22 1200  vancomycin (VANCOREADY) IVPB 1250 mg/250 mL  Status:  Discontinued        1,250 mg 166.7 mL/hr over 90 Minutes Intravenous Every 48 hours 06/30/22 0224 06/30/22 1100   06/30/22 2200  vancomycin (VANCOREADY) IVPB 750 mg/150 mL  Status:  Discontinued        750 mg 150 mL/hr over 60 Minutes Intravenous Every 24 hours 06/30/22 1100 07/03/22 1430   06/30/22 0600  piperacillin-tazobactam (ZOSYN) IVPB 3.375 g  Status:  Discontinued        3.375 g 12.5 mL/hr over 240 Minutes Intravenous Every 8 hours 06/30/22 0227 07/04/22 1235   06/30/22 0215  vancomycin (VANCOCIN) IVPB 1000 mg/200 mL premix  Status:  Discontinued        1,000 mg 200 mL/hr over 60 Minutes Intravenous  Once 06/30/22 0210 06/30/22 0223   06/30/22 0215  piperacillin-tazobactam (ZOSYN) IVPB 3.375 g  Status:  Discontinued        3.375 g 100 mL/hr over 30 Minutes Intravenous Every 6 hours 06/30/22 0210 06/30/22 0226   06/29/22 2245  ceFEPIme (MAXIPIME) 2 g in sodium chloride 0.9 % 100 mL IVPB        2 g 200 mL/hr over 30 Minutes  Intravenous  Once 06/29/22 2242 06/30/22 0159   06/29/22 2245  metroNIDAZOLE (FLAGYL) IVPB 500 mg        500 mg 100 mL/hr over 60 Minutes Intravenous  Once 06/29/22 2242 06/30/22 0042   06/29/22 2245  vancomycin (VANCOCIN) IVPB 1000 mg/200 mL premix        1,000 mg 200 mL/hr over 60 Minutes Intravenous  Once 06/29/22 2242 06/30/22 0120       Data Reviewed: I have personally reviewed following labs and imaging studies  CBC: Recent Labs  Lab 06/29/22 2118 06/30/22 0526 07/01/22 0421 07/04/22 0434  WBC 13.3* 6.0 11.6* 8.4  NEUTROABS 10.7*  --   --   --   HGB 12.1 10.7* 10.7* 9.6*  HCT 37.2 33.2* 33.2* 30.2*  MCV 87.1 86.2 88.3 89.6  PLT 260 240 228 101   Basic Metabolic Panel: Recent Labs  Lab 06/29/22 2118 06/30/22 0526 07/01/22 0421 07/02/22 0447 07/03/22 0433 07/04/22 0434  NA 139 139 139  --   --  140  K 2.8* 4.0 4.9  --   --  3.7  CL 104 108 111  --   --  108  CO2 '26 25 24  '$ --   --  27  GLUCOSE 110* 146* 122*  --   --  74  BUN '20 20 20  '$ --   --  13  CREATININE 1.17* 0.87 0.78 0.84 0.75 0.81  CALCIUM 8.7* 8.1* 8.1*  --   --  7.8*  MG 1.6*  --  2.2  --   --   --    GFR: Estimated Creatinine Clearance: 50.6 mL/min (by C-G formula based on SCr of 0.81 mg/dL). Liver Function Tests: Recent Labs  Lab 06/29/22 2118  AST 26  ALT 20  ALKPHOS 73  BILITOT 0.8  PROT 6.7  ALBUMIN 3.5   No results for input(s): "LIPASE", "AMYLASE" in the last 168 hours. No results for input(s): "AMMONIA" in the last 168 hours. Coagulation Profile: Recent Labs  Lab 06/30/22 0526  INR 1.1   Cardiac Enzymes: No results for input(s): "CKTOTAL", "CKMB", "CKMBINDEX", "TROPONINI" in the last 168 hours. BNP (last 3 results) No results for input(s): "PROBNP" in the last 8760 hours. HbA1C: No results for input(s): "HGBA1C" in the last 72 hours. CBG: Recent Labs  Lab 07/02/22 2328  GLUCAP 107*   Lipid Profile: No results for input(s): "CHOL", "HDL", "LDLCALC", "TRIG",  "CHOLHDL", "LDLDIRECT" in the last 72 hours. Thyroid Function Tests: No results for input(s): "TSH", "T4TOTAL", "FREET4", "T3FREE", "  THYROIDAB" in the last 72 hours.  Anemia Panel: No results for input(s): "VITAMINB12", "FOLATE", "FERRITIN", "TIBC", "IRON", "RETICCTPCT" in the last 72 hours. Urine analysis:    Component Value Date/Time   COLORURINE YELLOW (A) 06/29/2022 2358   APPEARANCEUR HAZY (A) 06/29/2022 2358   LABSPEC 1.017 06/29/2022 2358   PHURINE 5.0 06/29/2022 2358   GLUCOSEU 50 (A) 06/29/2022 2358   HGBUR MODERATE (A) 06/29/2022 2358   BILIRUBINUR NEGATIVE 06/29/2022 2358   KETONESUR 5 (A) 06/29/2022 2358   PROTEINUR 30 (A) 06/29/2022 2358   NITRITE NEGATIVE 06/29/2022 2358   LEUKOCYTESUR NEGATIVE 06/29/2022 2358   Sepsis Labs: '@LABRCNTIP'$ (procalcitonin:4,lacticidven:4)  Recent Results (from the past 240 hour(s))  Blood culture (routine x 2)     Status: Abnormal   Collection Time: 06/29/22  7:53 PM   Specimen: BLOOD LEFT FOREARM  Result Value Ref Range Status   Specimen Description   Final    BLOOD LEFT FOREARM Performed at [facility], [place] [address], [place], [place] [address]    Special Requests   Final    IN PEDIATRIC BOTTLE Blood Culture adequate volume Performed at [facility], [address] [place], [place] [address]    Culture  Setup Time   Final    GRAM POSITIVE RODS IN PEDIATRIC BOTTLE CRITICAL RESULT CALLED TO, READ BACK BY AND VERIFIED WITH: WILL ANDERSON ON 07/01/22 AT 1249 QSD Performed at [facility], [address] [place], [place] [address]    Culture (A)  Final    DIPHTHEROIDS(CORYNEBACTERIUM SPECIES) Standardized susceptibility testing for this organism is not available. Performed at [facility], [place] [address], [place], [place] [address]    Report Status 07/02/2022 FINAL  Final  SARS Coronavirus 2 by RT PCR (hospital order, performed in [facility] hospital lab) *cepheid single result test* Anterior  Nasal Swab     Status: None   Collection Time: 06/29/22  7:53 PM   Specimen: Anterior Nasal Swab  Result Value Ref Range Status   SARS Coronavirus 2 by RT PCR NEGATIVE NEGATIVE Final    Comment: (NOTE) SARS-CoV-2 target nucleic acids are NOT DETECTED.  The SARS-CoV-2 RNA is generally detectable in upper and lower respiratory specimens during the acute phase of infection. The lowest concentration of SARS-CoV-2 viral copies this assay can detect is 250 copies / mL. A negative result does not preclude SARS-CoV-2 infection and should not be used as the sole basis for treatment or other patient management decisions.  A negative result may occur with improper specimen collection / handling, submission of specimen other than nasopharyngeal swab, presence of viral mutation(s) within the areas targeted by this assay, and inadequate number of viral copies (<250 copies / mL). A negative result must be combined with clinical observations, patient history, and epidemiological information.  Fact Sheet for Patients:   https://www.patel.info/  Fact Sheet for Healthcare Providers: https://hall.com/  This test is not yet approved or  cleared by the [place] FDA and has been authorized for detection and/or diagnosis of SARS-CoV-2 by FDA under an Emergency Use Authorization (EUA).  This EUA will remain in effect (meaning this test can be used) for the duration of the COVID-19 declaration under Section 564(b)(1) of the Act, 21 U.S.C. section 360bbb-3(b)(1), unless the authorization is terminated or revoked sooner.  Performed at [facility], [address] [place], [place] [address]   Culture, blood (Routine X 2) w Reflex to ID Panel     Status: None  Collection Time: 06/29/22  9:20 PM   Specimen: BLOOD  Result Value Ref Range Status   Specimen Description BLOOD BLOOD LEFT FOREARM  Final   Special Requests   Final    BOTTLES DRAWN  AEROBIC AND ANAEROBIC Blood Culture adequate volume   Culture   Final    NO GROWTH 5 DAYS Performed at [facility], [address] [place], [place] [address]    Report Status 07/04/2022 FINAL  Final  Urine Culture     Status: None   Collection Time: 06/29/22 11:21 PM   Specimen: Urine, Random  Result Value Ref Range Status   Specimen Description   Final    URINE, RANDOM Performed at [facility], [address] [place], [place] [address]    Special Requests   Final    NONE Performed at [facility], [address], [place], [place] [address]    Culture   Final    NO GROWTH Performed at [facility], [place] [address] [place], [place] [address]    Report Status 07/01/2022 FINAL  Final         Radiology Studies: DG Foot Complete Left  Result Date: 06/29/2022 CLINICAL DATA:  toe discolation EXAM: LEFT FOOT - COMPLETE 3+ VIEW COMPARISON:  None Available. FINDINGS: No cortical erosion or destruction. Cortical irregularity along the head of the second digit proximal phalanx likely old healed fracture. There is no evidence of fracture or dislocation. There is no evidence of arthropathy or other focal bone abnormality. Soft tissues are unremarkable. IMPRESSION: 1. No cortical erosion or destruction to suggest osteomyelitis of the left foot. 2. No definite acute displaced fracture or dislocation. Electronically Signed   By: Iven Finn M.D.   On: 06/29/2022 23:30   DG Foot Complete Right  Result Date: 06/29/2022 CLINICAL DATA:  toe discolation EXAM: RIGHT FOOT COMPLETE - 3+ VIEW COMPARISON:  X-ray right foot 05/07/2022 FINDINGS: Query interval development of cortical erosion of the fifth digit distal phalanx. Query prior second, third, fourth toe phalangeal amputation. Chronic cortical irregularity/nonaggressive periosteal reaction along the fourth metatarsal body and neck. No cortical erosion or destruction. There is no evidence of fracture or  dislocation. Subcutaneus soft tissue edema. IMPRESSION: Query interval development of cortical erosion of the fifth digit distal phalanx. Consider MRI (with contrast if GFR greater than 30) for further evaluation of possible underlying osteomyelitis. Electronically Signed   By: Iven Finn M.D.   On: 06/29/2022 23:28   CT HEAD WO CONTRAST (5MM)  Result Date: 06/29/2022 CLINICAL DATA:  Mental status change, unknown cause EXAM: CT HEAD WITHOUT CONTRAST TECHNIQUE: Contiguous axial images were obtained from the base of the skull through the vertex without intravenous contrast. RADIATION DOSE REDUCTION: This exam was performed according to the departmental dose-optimization program which includes automated exposure control, adjustment of the mA and/or kV according to patient size and/or use of iterative reconstruction technique. COMPARISON:  Head CT 05/07/2022 FINDINGS: Brain: No intracranial hemorrhage, mass effect, or midline shift. Stable degree of atrophy. No hydrocephalus. The basilar cisterns are patent. Stable periventricular and deep white matter hypodensity typical of chronic small vessel ischemic change. No evidence of territorial infarct or acute ischemia. No extra-axial or intracranial fluid collection. Vascular: Atherosclerosis of skullbase vasculature without hyperdense vessel or abnormal calcification. Skull: No fracture or focal lesion. Sinuses/Orbits: No acute findings. The paranasal sinuses are clear. No mastoid effusion. Bilateral cataract resection. Other: None. IMPRESSION: 1. No acute intracranial abnormality. 2. Stable atrophy and chronic  small vessel ischemic change. Electronically Signed   By: Keith Rake M.D.   On: 06/29/2022 22:08   US Venous Img Lower Unilateral Right  Result Date: 06/29/2022 CLINICAL DATA:  Initial evaluation for acute leg swelling. EXAM: RIGHT LOWER EXTREMITY VENOUS DOPPLER ULTRASOUND TECHNIQUE: Gray-scale sonography with graded compression, as well as color  Doppler and duplex ultrasound were performed to evaluate the lower extremity deep venous systems from the level of the common femoral vein and including the common femoral, femoral, profunda femoral, popliteal and calf veins including the posterior tibial, peroneal and gastrocnemius veins when visible. The superficial great saphenous vein was also interrogated. Spectral Doppler was utilized to evaluate flow at rest and with distal augmentation maneuvers in the common femoral, femoral and popliteal veins. COMPARISON:  None Available. FINDINGS: Contralateral Common Femoral Vein: Respiratory phasicity is normal and symmetric with the symptomatic side. No evidence of thrombus. Normal compressibility. Common Femoral Vein: No evidence of thrombus. Normal compressibility, respiratory phasicity and response to augmentation. Saphenofemoral Junction: No evidence of thrombus. Normal compressibility and flow on color Doppler imaging. Profunda Femoral Vein: No evidence of thrombus. Normal compressibility and flow on color Doppler imaging. Femoral Vein: No evidence of thrombus. Normal compressibility, respiratory phasicity and response to augmentation. Popliteal Vein: No evidence of thrombus. Normal compressibility, respiratory phasicity and response to augmentation. Calf Veins: No evidence of thrombus. Normal compressibility and flow on color Doppler imaging. Superficial Great Saphenous Vein: No evidence of thrombus. Normal compressibility. Venous Reflux:  None. Other Findings:  None. IMPRESSION: No evidence of deep venous thrombosis. Electronically Signed   By: Jeannine Boga M.D.   On: 06/29/2022 21:44   DG Chest Portable 1 View  Result Date: 06/29/2022 CLINICAL DATA:  Shortness of breath. EXAM: PORTABLE CHEST 1 VIEW COMPARISON:  Radiograph 05/07/2022 FINDINGS: The lungs are hyperinflated. There is increased bronchial thickening from prior exam. The heart is normal in size with normal mediastinal contours. Aortic  atherosclerosis. No acute airspace disease. No pneumothorax, large pleural effusion, or evidence of pulmonary edema. Chronic change of the right shoulder. IMPRESSION: Increased bronchial thickening from prior exam, may be acute bronchitis or COPD exacerbation. Electronically Signed   By: Keith Rake M.D.   On: 06/29/2022 20:08            LOS: 6 days       Emeterio Reeve, DO [facility] 07/05/2022, 7:57 AM   Staff may message me via secure chat in [place]  but this may not receive immediate response,  please page for urgent matters!  If 7PM-7AM, please contact night-coverage [email or web address]  Dictation software was used to generate the above note. Typos may occur and escape review, as with typed/written notes. Please contact Dr Sheppard Coil directly for clarity if needed.

## 2022-07-05 NOTE — Brief Op Note (Signed)
07/05/2022  1:18 PM  PATIENT:  [name]  76 y.o. female  PRE-OPERATIVE DIAGNOSIS:  osteomyelitis  POST-OPERATIVE DIAGNOSIS:  osteomyelitis  PROCEDURE:  Procedure(s): AMPUTATION RIGHT 5TH TOE (Right)  SURGEON:  Surgeon(s) and Role:    * Taleia Sadowski, Stephan Minister, DPM - Primary  PHYSICIAN ASSISTANT:   ASSISTANTS: none   ANESTHESIA:   local and MAC  EBL:  0 mL   BLOOD ADMINISTERED:none  DRAINS: none   LOCAL MEDICATIONS USED:  MARCAINE    and Amount: 10 ml  SPECIMEN: Right fifth toe  DISPOSITION OF SPECIMEN:  PATHOLOGY  COUNTS:  YES  TOURNIQUET:  * No tourniquets in log *  DICTATION: .Note written in EPIC  PLAN OF CARE: Admit to inpatient   PATIENT DISPOSITION:  PACU - hemodynamically stable.   Delay start of Pharmacological VTE agent (>24hrs) due to surgical blood loss or risk of bleeding: no   Minimal bleeding intraoperative, healing prognosis is poor.  May require higher amputation at some point, for now will monitor allowed to heal, stable for discharge may be WBAT in surgical shoe, we will arrange follow-up within 1 to 2 weeks

## 2022-07-05 NOTE — Anesthesia Procedure Notes (Signed)
Procedure Name: MAC Date/Time: 07/05/2022 12:45 PM  Performed by: Tollie Eth, CRNAPre-anesthesia Checklist: Patient identified, Emergency Drugs available, Suction available and Patient being monitored Patient Re-evaluated:Patient Re-evaluated prior to induction Oxygen Delivery Method: Nasal cannula Induction Type: IV induction Placement Confirmation: positive ETCO2

## 2022-07-06 ENCOUNTER — Encounter: Payer: Self-pay | Admitting: Podiatry

## 2022-07-06 ENCOUNTER — Other Ambulatory Visit: Payer: Self-pay

## 2022-07-06 DIAGNOSIS — E876 Hypokalemia: Secondary | ICD-10-CM | POA: Diagnosis not present

## 2022-07-06 DIAGNOSIS — M86671 Other chronic osteomyelitis, right ankle and foot: Secondary | ICD-10-CM | POA: Diagnosis not present

## 2022-07-06 DIAGNOSIS — M869 Osteomyelitis, unspecified: Secondary | ICD-10-CM | POA: Diagnosis not present

## 2022-07-06 DIAGNOSIS — J441 Chronic obstructive pulmonary disease with (acute) exacerbation: Secondary | ICD-10-CM | POA: Diagnosis not present

## 2022-07-06 DIAGNOSIS — J9621 Acute and chronic respiratory failure with hypoxia: Secondary | ICD-10-CM | POA: Diagnosis not present

## 2022-07-06 LAB — CREATININE, SERUM
Creatinine, Ser: 0.62 mg/dL (ref 0.44–1.00)
GFR, Estimated: >60 mL/min (ref >60)

## 2022-07-06 MED ORDER — HYDROMORPHONE HCL 1 MG/ML IJ SOLN
1.0000 mg | INTRAMUSCULAR | Status: DC | PRN
  Administered 2022-07-06 – 2022-07-07 (×2): 1 mg via INTRAVENOUS
  Filled 2022-07-06 (×2): qty 1

## 2022-07-06 MED ORDER — ACETAMINOPHEN 325 MG PO TABS: 650.0000 mg | ORAL_TABLET | Freq: Four times a day (QID) | ORAL | Status: DC | PRN

## 2022-07-06 MED ORDER — ACETAMINOPHEN 650 MG RE SUPP: 650.0000 mg | Freq: Four times a day (QID) | RECTAL | Status: DC | PRN

## 2022-07-06 MED ORDER — APIXABAN 5 MG PO TABS
5.0000 mg | ORAL_TABLET | Freq: Two times a day (BID) | ORAL | 0 refills | Status: DC
  Filled 2022-07-06: qty 60, 30d supply, fill #0

## 2022-07-06 MED ORDER — DOXYCYCLINE HYCLATE 100 MG PO TABS
100.0000 mg | ORAL_TABLET | Freq: Two times a day (BID) | ORAL | Status: DC
  Administered 2022-07-06 – 2022-07-08 (×5): 100 mg via ORAL
  Filled 2022-07-06 (×5): qty 1

## 2022-07-06 MED ORDER — HYDROCODONE-ACETAMINOPHEN 10-325 MG PO TABS
1.0000 | ORAL_TABLET | ORAL | Status: DC | PRN
  Administered 2022-07-06 – 2022-07-07 (×4): 1 via ORAL
  Filled 2022-07-06 (×4): qty 1

## 2022-07-06 MED ORDER — SENNOSIDES-DOCUSATE SODIUM 8.6-50 MG PO TABS
2.0000 | ORAL_TABLET | Freq: Two times a day (BID) | ORAL | Status: DC
  Administered 2022-07-06 – 2022-07-08 (×5): 2 via ORAL
  Filled 2022-07-06 (×5): qty 2

## 2022-07-06 NOTE — Progress Notes (Signed)
  Subjective:  Patient ID: [name], female    DOB: 1945/12/28,  MRN: [id number]  A 76 y.o. 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  She states she is doing okay some pain in the toe overall feeling much better.  She denies any other acute complaints no nausea fever chills vomiting.  Resting comfortably at bedside Objective:   Vitals:   07/05/22 2006 07/06/22 0309  BP: 133/67 134/77  Pulse: 85 78  Resp: 18 18  Temp: 98.5 F (36.9 C) 98.8 F (37.1 C)  SpO2: 96% 99%   General AA&O x3. Normal mood and affect.  Vascular Dorsalis pedis and posterior tibial pulses not palpable bilateral Brisk capillary [place] to all digits.. Pedal hair not present. There is dryness of skin noted.  Neurologic Epicritic sensation grossly intact.  Dermatologic No clinical signs of infection noted.  No signs of further or worsening gangrene noted at this time.  No purulent drainage noted.  Orthopedic: MMT 5/5 in dorsiflexion, plantarflexion, inversion, and eversion. Normal joint ROM without pain or crepitus.     Assessment & Plan:  Patient was evaluated and treated and all questions answered.  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 -There is a concern for worsening gangrene therefore we will monitor clinically for 1 more day.  If the incision stays stable patient would be okay to be discharged tomorrow afternoon. -Per vascular she does not have good flow to the toe however she may be able to heal a transmetatarsal amputation in the future.  If she has worsening gangrene she will need to further amputation. -Weightbearing as tolerated with surgical shoe   Felipa Furnace, DPM  Accessible via secure chat for questions or concerns.

## 2022-07-06 NOTE — Progress Notes (Signed)
Progress Note   Patient: Alie Moudy WUJ:[id number] DOB: [birth year] DOA: 06/29/2022     7 DOS: the patient was seen and examined on 07/06/2022   Brief hospital course: Laelia Angelo is a 76 y.o. Caucasian female with medical history significant for CAD, COPD, depression, dyslipidemia, hypertension and hypothyroidism, who presented to the ER 06/29/2022 with acute onset of worsening dyspnea with associated cough productive of yellow sputum as well as wheezing over the last 4 to 5 days.  Also complaining of foot pain and ulceration, . 08/10: Tachycardic 126, tachypneic 26, on baseline 2 L [place] at home and was requiring 4 L O2 [place].  Hypokalemia 2.8, creatinine 1.17, troponins flat at 13 and 14, lactic acid 2.5 trended down to 1.3, procalcitonin less than 0.1, leukocytosis 13.3.  CXR concerning for bronchial thickening due to acute bronchitis/COPD exacerbation.  No DVT on ultrasound, CT head no concerns, XR left foot no acute concerns, XR right foot concerning for osteomyelitis.  Patient was started on IV antibiotics with cefepime, Flagyl, vancomycin.  500 mL NS bolus.  IV magnesium sulfate, Solu-Medrol, Xanax.  Admitted to hospitalist service with COPD exacerbation with acute on chronic hypoxic respiratory failure, sepsis due to osteomyelitis fifth toe of right foot to continue on vancomycin and Zosyn and podiatry consult obtained, electrolyte derangement including hypokalemia . 08/11: MRI R foot (+) osteomyelitis of the distal phalanx of the fifth toe, osteonecrosis involving the second, fourth, and fifth metatarsal heads. Bone marrow edema distal phalanx of the great toe posttraumatic vs early acute osteomyelitis.  Podiatry consult: Continue IV ABX, weightbearing as tolerated, ABI ordered, recommending vascular consult and pending vascular studies may need to consider amputation after possible angiography versus sooner if necrosis/sepsis . 08/12: vascular surgery consult: angiography Monday 08/14.   Leukocytosis likely due to steroids. Sinus tach confirmed on EKG, added BB given HTN. SPO2 at goal on 2 to 4 L [place]. One of 3 BCx (+)likely contaminant.  Marland Kitchen 08/13: SpO2 at goal on 2L, hypertensive and sinus tach, increased metoprolol.  Marland Kitchen 08/14: RLE Angiogram -  thrombectomy right SFA, popliteal, and anterior tibial arteries and balloon angioplasty right anterior tibial artery and right SFA.  Marland Kitchen 08/15: Podiatry: Planning for right fifth toe amputation planned tentatively for tomorrow . 08/16: R 5th toe amputation.  . 8/17: Requesting stronger pain medicine     Assessment and Plan: * COPD exacerbation ([place]) Acute on chronic respiratory failure with hypoxia  IV SoluMedrol completed  Continue bronchodilator therapy with DuoNebs and mucolytic therapy with Mucinex.  Supplemental O2, wean as able to room air  Osteomyelitis of fifth toe of right foot ([facility]) (+) sepsis POA as manifested by leukocytosis, tachycardia and tachypnea. 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  Overnight monitoring per podiatry  Hypokalemia repleted   Hypomagnesemia repleted   Essential hypertension  Continue antihypertensives.  Hypothyroidism  Continue Synthroid  Dyslipidemia  Continue statin   Anxiety and depression  continue Xanax as well as Lexapro, Wellbutrin.  Trazodone at bedtime as needed  Chest pain-resolved as of 07/03/2022 Eval for ACS in high risk patient, suspect GI or respiratory source also possible / more likely Pt declining CT chest / head at this time   GI cocktail, EKG, tropes  if symptoms resolve will monitor but if not will send for CTA chest to eval dissection given  her c/o pain between shoulder blades and CT head given headache.         Subjective: Requesting stronger pain medicine as she is not able to sleep well due to  pain control issues  Physical Exam: Vitals:   07/06/22 0309 07/06/22 0820 07/06/22 1340 07/06/22 1547  BP: 134/77 (!) 146/78 138/78 118/69  Pulse: 78 92  75  Resp: '18 18  18  '$ Temp: 98.8 F (37.1 C) 97.7 F (36.5 C)  98 F (36.7 C)  TempSrc: Oral Oral  Oral  SpO2: 99% 99%  93%  Weight:      Height:       76 year old female lying in the bed in some acute on chronic pain Lungs clear to auscultation bilaterally Cardiovascular regular rate and rhythm Abdomen soft, benign Neuro awake and alert, nonfocal Extremities dressing present on the right foot  Data Reviewed:  There are no new results to review at this time.  Family Communication: None  Disposition: Status is: Inpatient Remains inpatient appropriate because: Getting pain under better control and reevaluation of foot tomorrow by podiatry   Planned Discharge Destination: Home with Home Health    DVT prophylaxis-Eliquis Time spent: 35 minutes  Author: Max Sane, MD 07/06/2022 5:51 PM  For on call review [email or web address].

## 2022-07-06 NOTE — Progress Notes (Signed)
Nutrition Follow-up  DOCUMENTATION CODES:   Non-severe (moderate) malnutrition in context of chronic illness  INTERVENTION:   -Continue Ensure Enlive po TID, each supplement provides 350 kcal and 20 grams of protein -Continue MVI with minerals daily -Continue 500 mg vitamin C BID -Continue 220 mg zinc sulfate daily x 14 days -Secure chat message sent to MD and RN for consideration of bowel regimen; last BM documented on 06/27/22- MD to order bowel regimen  NUTRITION DIAGNOSIS:   Moderate Malnutrition related to chronic illness (COPD) as evidenced by mild fat depletion, moderate fat depletion, mild muscle depletion, moderate muscle depletion.  Ongoing  GOAL:   Patient will meet greater than or equal to 90% of their needs  Progressing   MONITOR:   PO intake, Supplement acceptance  REASON FOR ASSESSMENT:   Consult Assessment of nutrition requirement/status  ASSESSMENT:   Pt with medical history significant for CAD, COPD, depression, dyslipidemia, hypertension and hypothyroidism, who presented with acute onset of worsening dyspnea with associated cough productive of yellow sputum as well as wheezing over the last 4 to 5 days.  8/14- rt lower extremity angiogram 8/16- s/p rt fifth toe amputation  Reviewed I/O's: -1 L x 24 hours and -2 L since admission  UOP: 1.7 L x 24 hours   Per podiatry notes, there is concern for worsening gangrene, so will monitor for one more day; pt may require further amputation (transmetatarsal) in the future. Potential discharge tomorrow if incision remains stable.   Pt sitting up in bed at time of visit. Pt unable to provide much history secondary to pain. She reports her pain is unbearable and "the boot that they put on my foot feels like it's 2 sizes too big". When asked if the pain is directly related to surgery, pt reports she has had significant pain this entire admission.   Breakfast tray in front of pt untouched. She is currently on a  regular diet. Noted meal completions documented at 50%. Per MAR, pt is consuming Ensure supplements.   Reviewed wt hx; pt has experienced a 4.3% wt loss over the past 2 months, which is not significant for time frame.   Medications reviewed and include miralax (ordered 8/12). No documented BM since 06/27/22. Secure chat message sent to MD and RN.   Labs reviewed: CBGS: 107 (inpatient orders for glycemic control are none).    NUTRITION - FOCUSED PHYSICAL EXAM:  Flowsheet Row Most Recent Value  Orbital Region Mild depletion  Upper Arm Region Moderate depletion  Thoracic and Lumbar Region Mild depletion  Buccal Region Mild depletion  Temple Region Mild depletion  Clavicle Bone Region Mild depletion  Clavicle and Acromion Bone Region Mild depletion  Scapular Bone Region Mild depletion  Dorsal Hand Mild depletion  Patellar Region Moderate depletion  Anterior Thigh Region Moderate depletion  Posterior Calf Region Moderate depletion  Edema (RD Assessment) None  Hair Reviewed  Eyes Reviewed  Mouth Reviewed  Skin Reviewed  Nails Reviewed       Diet Order:   Diet Order             Diet regular Room service appropriate? Yes; Fluid consistency: Thin  Diet effective now                   EDUCATION NEEDS:   Not appropriate for education at this time  Skin:  Skin Assessment: Skin Integrity Issues: Skin Integrity Issues:: Incisions Incisions: closed rt foot, rt proximal thigh  Last BM:  06/27/22  Height:  Ht Readings from Last 1 Encounters:  07/03/22 '5\' 4"'$  (1.626 m)    Weight:   Wt Readings from Last 1 Encounters:  07/03/22 53.4 kg    Ideal Body Weight:  54.5 kg  BMI:  Body mass index is 20.21 kg/m.  Estimated Nutritional Needs:   Kcal:  1650-1850  Protein:  85-100 grams  Fluid:  > 1.6 L    Loistine Chance, RD, LDN, [place] Registered Dietitian II Certified Diabetes Care and Education Specialist Please refer to [facility] for RD and/or RD on-call/weekend/after  hours pager

## 2022-07-06 NOTE — Progress Notes (Signed)
Physical Therapy Treatment Patient Details Name: [name] MRN: [id number] DOB: [birth year] Today's Date: 07/06/2022   History of Present Illness Pt is a 76 year old female presenting to the ED with acute onset of worsening dyspnea with associated cough productive of yellow sputum as well as wheezing over the last 4 to 5 days. Admitted with COPD exacerbation, osteomyelitis of fifth toe of right foot, and increased RLE pain 2/2 chronic ischemia of RLE. Now s/p R 5th toe amputation   PT Comments    Patient is agreeable to PT following surgery yesterday. Surgical shoe donned prior to mobilizing and education provided on WBAT with ambulation with shoe in place. The patient is able to ambulate a short distance with the rolling walker with no loss of balance. Min guard initially progressing to supervision with increased ambulation distance. Rolling walker recommended for safety and fall prevention with standing activity. Sp02 94% on room air after walking and patient fatigued with activity. Recommend PT follow up to maximize independence in preparation for home discharge.    Recommendations for follow up therapy are one component of a multi-disciplinary discharge planning process, led by the attending physician.  Recommendations may be updated based on patient status, additional functional criteria and insurance authorization.  Follow Up Recommendations  Home health PT     Assistance Recommended at Discharge Intermittent Supervision/Assistance  Patient can return home with the following Assist for transportation;Direct supervision/assist for medications management;Assistance with cooking/housework   Equipment Recommendations  Rolling walker (2 wheels)    Recommendations for Other Services       Precautions / Restrictions Precautions Precautions: Fall Restrictions Weight Bearing Restrictions: Yes RLE Weight Bearing: Weight bearing as tolerated Other Position/Activity Restrictions: WBAT  RLE with surgical shoe     Mobility  Bed Mobility Overal bed mobility: Modified Independent                  Transfers Overall transfer level: Modified independent Equipment used: Rolling walker (2 wheels) Transfers: Sit to/from Stand Sit to Stand: Modified independent (Device/Increase time)           General transfer comment: verbal cues for hand placement for safety    Ambulation/Gait Ambulation/Gait assistance: Min guard, Supervision Gait Distance (Feet): 50 Feet Assistive device: Rolling walker (2 wheels) Gait Pattern/deviations: Step-to pattern, Decreased stance time - right Gait velocity: decreased     General Gait Details: patient ambulated a short distance with rolling walker and R post-op shoe in place. occasional cues for safety with ambulation. Min guard initially progressing to supervision with increased ambulation distance. patient is relying heavily on rolling walker for support with ambulation   Stairs             Wheelchair Mobility    Modified Rankin (Stroke Patients Only)       Balance Overall balance assessment: Needs assistance Sitting-balance support: Feet supported Sitting balance-Leahy Scale: Good     Standing balance support: Bilateral upper extremity supported Standing balance-Leahy Scale: Fair Standing balance comment: with RW for UE support                            Cognition Arousal/Alertness: Awake/alert Behavior During Therapy: WFL for tasks assessed/performed Overall Cognitive Status: Within Functional Limits for tasks assessed  Exercises      General Comments General comments (skin integrity, edema, etc.): secure chat with Felipa Furnace, DPM to confirm WBAT with surgical shoe in place following toe ampuation. Sp02 94% on room air with ambulation. patient was fatigued after walking. educated patient to wear post-op shoe with ambulation per  podiatry      Pertinent Vitals/Pain Pain Assessment Faces Pain Scale: Hurts little more Pain Location: R foot Pain Descriptors / Indicators: Discomfort Pain Intervention(s): Limited activity within patient's tolerance, Premedicated before session, Repositioned, Monitored during session    Home Living                          Prior Function            PT Goals (current goals can now be found in the care plan section) Acute Rehab PT Goals Patient Stated Goal: to return home soon PT Goal Formulation: With patient Time For Goal Achievement: 07/15/22 Potential to Achieve Goals: Good Progress towards PT goals: Progressing toward goals    Frequency    Min 2X/week      PT Plan Current plan remains appropriate    Co-evaluation              AM-PAC PT "6 Clicks" Mobility   Outcome Measure  Help needed turning from your back to your side while in a flat bed without using bedrails?: None Help needed moving from lying on your back to sitting on the side of a flat bed without using bedrails?: None Help needed moving to and from a bed to a chair (including a wheelchair)?: None Help needed standing up from a chair using your arms (e.g., wheelchair or bedside chair)?: A Little Help needed to walk in hospital room?: A Little Help needed climbing 3-5 steps with a railing? : A Little 6 Click Score: 21    End of Session Equipment Utilized During Treatment: Gait belt Activity Tolerance: Patient tolerated treatment well Patient left: in bed;with call bell/phone within reach;with bed alarm set Nurse Communication: Mobility status PT Visit Diagnosis: Unsteadiness on feet (R26.81);Difficulty in walking, not elsewhere classified (R26.2);History of falling (Z91.81);Other abnormalities of gait and mobility (R26.89)     Time: 1024-1050 PT Time Calculation (min) (ACUTE ONLY): 26 min  Charges:  $Gait Training: 23-37 mins                    Minna Merritts, PT,  MPT    [name] 07/06/2022, 2:06 PM

## 2022-07-06 NOTE — Progress Notes (Signed)
End of shift note:  Pt worked with PT/OT today and she was able to ambulate with x1 assist and a walker. VSS. Pt was transitioned to PO antibiotic. Prn IV/PO pain medications for pain. Right foot dressing is clean, dry and intact.

## 2022-07-06 NOTE — Op Note (Signed)
Patient Name: [name] DOB: 1946/04/01  MRN: [id number]   Date of Service: 06/29/2022 - 07/03/2022  Surgeon: Dr. Lanae Crumbly, DPM Assistants: None Pre-operative Diagnosis:  Osteomyelitis right fifth toe Peripheral arterial disease Post-operative Diagnosis:  Osteomyelitis right fifth toe Peripheral arterial disease Procedures:  1) amputation toe right metatarsophalangeal joint Pathology/Specimens: ID Type Source Tests Collected by Time Destination  1 : RIGHT FIFTH TOE Tissue PATH Other SURGICAL PATHOLOGY [name], DPM 07/05/2022 [id number]    Anesthesia: Monitored anesthesia care with local Hemostasis: * No tourniquets in log * Estimated Blood Loss: 0 mL Materials: * No implants in log * Medications: 10 cc 0.5% Marcaine plain Findings: Poor bleeding noted intraoperatively  Indications for Procedure:  This is a 76 y.o. female with a history of severe PAD and tobacco use with previous right foot toe amputations, she developed osteomyelitis of her right fifth toe.  She underwent angiography with vascular surgery previously.  Amputation was recommended.  All questions were addressed prior to surgery.  Informed consent was signed and reviewed.   Procedure in Detail: Patient was identified in pre-operative holding area. Formal consent was signed and the right lower extremity was marked. Patient was brought back to the operating room. Anesthesia was induced. The extremity was prepped and draped in the usual sterile fashion. Timeout was taken to confirm patient name, laterality, and procedure prior to incision.   Attention was then directed to the right fifth toe where an incision was made and a racquet style fashion. Dissection was carried down to level of bone.  Dissection was continued to the metatarsophalangeal joint and all collateral ligaments were freed at the joint.  The bone soft tissue attachments of the proximal phalanx were removed and passed for pathology.  The remaining  metatarsal head appeared healthy and viable.  The area was copiously irrigated.  The skin was reapproximated with nylon and Monocryl.  There was minimal bleeding noted.  No tourniquet was used.   The foot was then dressed with Xeroform and dry sterile dressings. Patient tolerated the procedure well.   Disposition: Following a period of post-operative monitoring, patient will be transferred to the floor.

## 2022-07-07 DIAGNOSIS — M869 Osteomyelitis, unspecified: Secondary | ICD-10-CM | POA: Diagnosis not present

## 2022-07-07 DIAGNOSIS — E876 Hypokalemia: Secondary | ICD-10-CM | POA: Diagnosis not present

## 2022-07-07 DIAGNOSIS — J441 Chronic obstructive pulmonary disease with (acute) exacerbation: Secondary | ICD-10-CM | POA: Diagnosis not present

## 2022-07-07 DIAGNOSIS — J9621 Acute and chronic respiratory failure with hypoxia: Secondary | ICD-10-CM | POA: Diagnosis not present

## 2022-07-07 LAB — BASIC METABOLIC PANEL
Anion gap: 4 — ABNORMAL LOW (ref 5–15)
BUN: 8 mg/dL (ref 8–23)
CO2: 32 mmol/L (ref 22–32)
Calcium: 8.3 mg/dL — ABNORMAL LOW (ref 8.9–10.3)
Chloride: 107 mmol/L (ref 98–111)
Creatinine, Ser: 0.76 mg/dL (ref 0.44–1.00)
GFR, Estimated: >60 mL/min (ref >60)
Glucose, Bld: 100 mg/dL — ABNORMAL HIGH (ref 70–99)
Potassium: 3.8 mmol/L (ref 3.5–5.1)
Sodium: 143 mmol/L (ref 135–145)

## 2022-07-07 LAB — CBC
HCT: 30.3 % — ABNORMAL LOW (ref 36.0–46.0)
Hemoglobin: 9.4 g/dL — ABNORMAL LOW (ref 12.0–15.0)
MCH: 28.1 pg (ref 26.0–34.0)
MCHC: 31 g/dL (ref 30.0–36.0)
MCV: 90.4 fL (ref 80.0–100.0)
Platelets: 283 10*3/uL (ref 150–400)
RBC: 3.35 MIL/uL — ABNORMAL LOW (ref 3.87–5.11)
RDW: 14.3 % (ref 11.5–15.5)
WBC: 9.3 10*3/uL (ref 4.0–10.5)
nRBC: 0 % (ref 0.0–0.2)

## 2022-07-07 LAB — SURGICAL PATHOLOGY

## 2022-07-07 LAB — MRSA NEXT GEN BY PCR, NASAL: MRSA by PCR Next Gen: DETECTED — AB

## 2022-07-07 MED ORDER — MUPIROCIN 2 % EX OINT
1.0000 | TOPICAL_OINTMENT | Freq: Two times a day (BID) | CUTANEOUS | Status: DC
  Administered 2022-07-07 – 2022-07-08 (×2): 1 via NASAL
  Filled 2022-07-07: qty 22

## 2022-07-07 MED ORDER — CHLORHEXIDINE GLUCONATE CLOTH 2 % EX PADS: 6.0000 | MEDICATED_PAD | Freq: Every day | CUTANEOUS | Status: DC

## 2022-07-07 MED ORDER — HYDROCODONE-ACETAMINOPHEN 10-325 MG PO TABS
1.0000 | ORAL_TABLET | ORAL | Status: DC | PRN
  Administered 2022-07-07 – 2022-07-08 (×5): 1 via ORAL
  Filled 2022-07-07 (×5): qty 1

## 2022-07-07 MED ORDER — BISACODYL 5 MG PO TBEC
5.0000 mg | DELAYED_RELEASE_TABLET | Freq: Once | ORAL | Status: AC
  Administered 2022-07-07: 5 mg via ORAL
  Filled 2022-07-07: qty 1

## 2022-07-07 MED ORDER — CHLORHEXIDINE GLUCONATE CLOTH 2 % EX PADS
6.0000 | MEDICATED_PAD | Freq: Every day | CUTANEOUS | Status: DC
  Administered 2022-07-08: 6 via TOPICAL

## 2022-07-07 NOTE — Progress Notes (Signed)
Physical Therapy Treatment Patient Details Name: [name] MRN: [id number] DOB: 1946-09-08 Today's Date: 07/07/2022   History of Present Illness Pt is a 76 year old female presenting to the ED with acute onset of worsening dyspnea with associated cough productive of yellow sputum as well as wheezing over the last 4 to 5 days. Admitted with COPD exacerbation, osteomyelitis of fifth toe of right foot, and increased RLE pain 2/2 chronic ischemia of RLE. Now s/p R 5th toe amputation    PT Comments    Patient with increased activity tolerance this session. Gait training performed with rolling walker and surgical shoe in place with occasional cues for safety. No significant shortness of breath is noted with activity. Encouraged patient to perform pursed lip breathing techniques if feeling shortness of breath. Recommend to continue PT to maximize independence.    Recommendations for follow up therapy are one component of a multi-disciplinary discharge planning process, led by the attending physician.  Recommendations may be updated based on patient status, additional functional criteria and insurance authorization.  Follow Up Recommendations  Home health PT     Assistance Recommended at Discharge Intermittent Supervision/Assistance  Patient can return home with the following Assist for transportation;Direct supervision/assist for medications management;Assistance with cooking/housework   Equipment Recommendations  Rolling walker (2 wheels)    Recommendations for Other Services       Precautions / Restrictions Precautions Precautions: Fall Restrictions Weight Bearing Restrictions: Yes RLE Weight Bearing: Weight bearing as tolerated Other Position/Activity Restrictions: WBAT RLE with surgical shoe     Mobility  Bed Mobility Overal bed mobility: Modified Independent                  Transfers Overall transfer level: Modified independent Equipment used: Rolling walker (2  wheels) Transfers: Sit to/from Stand Sit to Stand: Modified independent (Device/Increase time)           General transfer comment: occasional cues for hand placement for support    Ambulation/Gait Ambulation/Gait assistance: Supervision Gait Distance (Feet): 58 Feet Assistive device: Rolling walker (2 wheels) Gait Pattern/deviations: Step-to pattern, Step-through pattern Gait velocity: decreased     General Gait Details: step to pattern initially progressing to step through with incresed ambulation distance. reinforcement of proper rolling walker use. post-op shoe was in place during ambulation bout   Stairs             Wheelchair Mobility    Modified Rankin (Stroke Patients Only)       Balance Overall balance assessment: Needs assistance Sitting-balance support: Feet supported Sitting balance-Leahy Scale: Good     Standing balance support: Bilateral upper extremity supported Standing balance-Leahy Scale: Fair Standing balance comment: with RW for UE support                            Cognition Arousal/Alertness: Awake/alert Behavior During Therapy: WFL for tasks assessed/performed Overall Cognitive Status: Within Functional Limits for tasks assessed                                 General Comments: patient is [facility]        Exercises      General Comments General comments (skin integrity, edema, etc.): unable to get a reading for Sp02 after walking on room air (tried on more than one finger). no significant shortness of breath noted durig ambulation. encouraged patient to use pursed  lip breathing techniques if having dyspnea with exertion in the future      Pertinent Vitals/Pain Pain Assessment Pain Assessment: Faces Faces Pain Scale: Hurts little more Pain Location: R foot Pain Descriptors / Indicators: Discomfort Pain Intervention(s): Limited activity within patient's tolerance    Home Living                           Prior Function            PT Goals (current goals can now be found in the care plan section) Acute Rehab PT Goals Patient Stated Goal: to go home tomorrow PT Goal Formulation: With patient Time For Goal Achievement: 07/15/22 Potential to Achieve Goals: Good Progress towards PT goals: Progressing toward goals    Frequency    Min 2X/week      PT Plan Current plan remains appropriate    Co-evaluation              AM-PAC PT "6 Clicks" Mobility   Outcome Measure  Help needed turning from your back to your side while in a flat bed without using bedrails?: None Help needed moving from lying on your back to sitting on the side of a flat bed without using bedrails?: None Help needed moving to and from a bed to a chair (including a wheelchair)?: None Help needed standing up from a chair using your arms (e.g., wheelchair or bedside chair)?: A Little Help needed to walk in hospital room?: A Little Help needed climbing 3-5 steps with a railing? : A Little 6 Click Score: 21    End of Session Equipment Utilized During Treatment:  (surgical shoe) Activity Tolerance: Patient tolerated treatment well Patient left: in bed;with call bell/phone within reach;with bed alarm set   PT Visit Diagnosis: Unsteadiness on feet (R26.81);Difficulty in walking, not elsewhere classified (R26.2);History of falling (Z91.81);Other abnormalities of gait and mobility (R26.89)     Time: 1000-1018 PT Time Calculation (min) (ACUTE ONLY): 18 min  Charges:  $Gait Training: 8-22 mins                     [name], PT, MPT    [name] 07/07/2022, 10:25 AM

## 2022-07-07 NOTE — Assessment & Plan Note (Signed)
repleted ?

## 2022-07-07 NOTE — Assessment & Plan Note (Signed)
(+)   sepsis POA as manifested by leukocytosis, tachycardia and tachypnea. 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    Podiatry will reevaluate her today  Patient is refusing to leave today as her grandson cannot pick up.  I called her grandson who confirms the same and requesting discharge tomorrow morning

## 2022-07-07 NOTE — Assessment & Plan Note (Signed)
Acute on chronic respiratory failure with hypoxia  IV SoluMedrol completed  Continue bronchodilator therapy with DuoNebs and mucolytic therapy with Mucinex.  Currently on 1 L oxygen via nasal cannula, wean as able to room air

## 2022-07-07 NOTE — Care Management Important Message (Signed)
Important Message  Patient Details  Name: [name] MRN: [id number] Date of Birth: [birth year]   Medicare Important Message Given:  Yes     Dannette Barbara 07/07/2022, 10:51 AM

## 2022-07-07 NOTE — Assessment & Plan Note (Signed)
Continue antihypertensives. ?

## 2022-07-07 NOTE — Assessment & Plan Note (Signed)
continue Xanax as well as Lexapro, Wellbutrin.  Trazodone at bedtime as needed

## 2022-07-07 NOTE — Progress Notes (Signed)
Progress Note   Patient: [name] PPJ:[id number] DOB: 1946-03-21 DOA: 06/29/2022     8 DOS: the patient was seen and examined on 07/07/2022   Brief hospital course: [name] is a 76 y.o. Caucasian female with medical history significant for CAD, COPD, depression, dyslipidemia, hypertension and hypothyroidism, who presented to the ER 06/29/2022 with acute onset of worsening dyspnea with associated cough productive of yellow sputum as well as wheezing over the last 4 to 5 days.  Also complaining of foot pain and ulceration, . 08/10: Tachycardic 126, tachypneic 26, on baseline 2 L [place] at home and was requiring 4 L O2 [place].  Hypokalemia 2.8, creatinine 1.17, troponins flat at 13 and 14, lactic acid 2.5 trended down to 1.3, procalcitonin less than 0.1, leukocytosis 13.3.  CXR concerning for bronchial thickening due to acute bronchitis/COPD exacerbation.  No DVT on ultrasound, CT head no concerns, XR left foot no acute concerns, XR right foot concerning for osteomyelitis.  Patient was started on IV antibiotics with cefepime, Flagyl, vancomycin.  500 mL NS bolus.  IV magnesium sulfate, Solu-Medrol, Xanax.  Admitted to hospitalist service with COPD exacerbation with acute on chronic hypoxic respiratory failure, sepsis due to osteomyelitis fifth toe of right foot to continue on vancomycin and Zosyn and podiatry consult obtained, electrolyte derangement including hypokalemia . 08/11: MRI R foot (+) osteomyelitis of the distal phalanx of the fifth toe, osteonecrosis involving the second, fourth, and fifth metatarsal heads. Bone marrow edema distal phalanx of the great toe posttraumatic vs early acute osteomyelitis.  Podiatry consult: Continue IV ABX, weightbearing as tolerated, ABI ordered, recommending vascular consult and pending vascular studies may need to consider amputation after possible angiography versus sooner if necrosis/sepsis . 08/12: vascular surgery consult: angiography Monday 08/14.   Leukocytosis likely due to steroids. Sinus tach confirmed on EKG, added BB given HTN. SPO2 at goal on 2 to 4 L [place]. One of 3 BCx (+)likely contaminant.  Marland Kitchen 08/13: SpO2 at goal on 2L, hypertensive and sinus tach, increased metoprolol.  Marland Kitchen 08/14: RLE Angiogram -  thrombectomy right SFA, popliteal, and anterior tibial arteries and balloon angioplasty right anterior tibial artery and right SFA.  Marland Kitchen 08/15: Podiatry: Planning for right fifth toe amputation planned tentatively for tomorrow . 08/16: R 5th toe amputation.  . 8/17: Requesting stronger pain medicine . 8/18: Refusing to leave today as her grandson cannot pick her up.  Her grandson confirmed same and requesting discharge tomorrow morning     Assessment and Plan: * COPD exacerbation ([place]) Acute on chronic respiratory failure with hypoxia  IV SoluMedrol completed  Continue bronchodilator therapy with DuoNebs and mucolytic therapy with Mucinex.  Currently on 1 L oxygen via nasal cannula, wean as able to room air  Osteomyelitis of fifth toe of right foot ([facility]) (+) sepsis POA as manifested by leukocytosis, tachycardia and tachypnea. 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    Podiatry will reevaluate her today  Patient is refusing to leave today as her grandson cannot pick up.  I called her grandson who confirms the same and requesting discharge tomorrow morning  Hypokalemia repleted.  Hypomagnesemia repleted.   Essential hypertension  Continue antihypertensives  Hypothyroidism  Continue Synthroid.  Dyslipidemia  Continue statin  Anxiety and depression continue Xanax as well as Lexapro, Wellbutrin.  Trazodone at bedtime as needed  Chest pain-resolved as of  07/03/2022 Eval for ACS in high risk patient, suspect GI or respiratory source also possible / more likely Pt declining CT chest / head at  this time   GI cocktail, EKG, tropes  if symptoms resolve will monitor but if not will send for CTA chest to eval dissection given her c/o pain between shoulder blades and CT head given headache.         Subjective: Requesting to keep her today as her grandson cannot pick her up.  Her pain is better controlled.  Still requiring 1 L oxygen via nasal cannula  Physical Exam: Vitals:   07/06/22 2113 07/06/22 2114 07/07/22 0427 07/07/22 0813  BP: (!) 151/89 (!) 151/89 (!) 146/72 (!) 148/68  Pulse:  95 79 77  Resp:  '20 18 18  '$ Temp:  98 F (36.7 C) (!) 97.5 F (36.4 C) 98.7 F (37.1 C)  TempSrc:  Oral Oral Oral  SpO2:  92% 92% 100%  Weight:      Height:       76 year old female seen doing in the chair comfortably without any acute distress Lungs clear to auscultation bilaterally Cardiovascular regular rate and rhythm Abdomen soft, benign Neuro awake and alert, nonfocal Extremities dressing present on the right foot Data Reviewed:  There are no new results to review at this time.  Family Communication: Updated grandson over phone  Disposition: Status is: Inpatient Remains inpatient appropriate because: Podiatry reevaluation today.  Weaning oxygen to room air if possible   Planned Discharge Destination: Home    DVT prophylaxis-Eliquis Time spent: 35 minutes  Author: Max Sane, MD 07/07/2022 2:56 PM  For on call review [email or web address].

## 2022-07-07 NOTE — Progress Notes (Signed)
Mobility Specialist - Progress Note   07/07/22 1600  Mobility  Activity Ambulated with assistance in room;Transferred from chair to bed  Level of Assistance Standby assist, set-up cues, supervision of patient - no hands on  Assistive Device Front wheel walker  Distance Ambulated (ft) 3 ft  Activity Response Tolerated well  $Mobility charge 1 Mobility    During mobility: 73 HR, 87% SpO2 Post-mobility: 71 HR, 92% SpO2   Pt sitting in recliner upon arrival, utilizing RA with [place] sitting in lap. O2 sats on arrival at 90%. Pt stood and ambulated in bed with supervision. Some pain in R foot. Completes bed mobility independently. O2 desat to 87% with transfer so O2 was reapplied on 1L. Pt left in bed with alarm set, needs in reach.    Kathee Delton Mobility Specialist 07/07/22, 4:44 PM

## 2022-07-07 NOTE — Assessment & Plan Note (Signed)
Continue statin. 

## 2022-07-07 NOTE — Evaluation (Signed)
Occupational Therapy Re-Evaluation Patient Details Name: [name] MRN: [id number] DOB: 06-11-1946 Today's Date: 07/07/2022   History of Present Illness Pt is a 76 year old female presenting to the ED with acute onset of worsening dyspnea with associated cough productive of yellow sputum as well as wheezing over the last 4 to 5 days. Admitted with COPD exacerbation, Osteomyelitis of fifth toe of right foot. Now s/p R 5th toe amputation   Clinical Impression   Chart reviewed, pt seen on this date for re-evaluation following 5th toe amputation. Pt continues to present with deficits in endurance and activity tolerance affecting safe and optimal ADL completion however has demonstrated progress since previous OT tx sessions as evidenced by amb approx 20' in room with RW and completing LB dressing with MIN A, step by step vcs for donning surgical shoe. Continue to recommend HHOT following discharge to address functional deficits. Pt is left in bedside chair, NAD, all needs met. OT will follow acutely.      Recommendations for follow up therapy are one component of a multi-disciplinary discharge planning process, led by the attending physician.  Recommendations may be updated based on patient status, additional functional criteria and insurance authorization.   Follow Up Recommendations  Home health OT    Assistance Recommended at Discharge Intermittent Supervision/Assistance  Patient can return home with the following A little help with walking and/or transfers;A little help with bathing/dressing/bathroom;Assistance with cooking/housework    Functional Status Assessment  Patient has had a recent decline in their functional status and demonstrates the ability to make significant improvements in function in a reasonable and predictable amount of time.  Equipment Recommendations  None recommended by OT    Recommendations for Other Services       Precautions / Restrictions  Precautions Precautions: Fall Restrictions Weight Bearing Restrictions: Yes RLE Weight Bearing: Weight bearing as tolerated Other Position/Activity Restrictions: with surgical shoe      Mobility Bed Mobility Overal bed mobility: Modified Independent                  Transfers   Equipment used: Rolling walker (2 wheels) Transfers: Sit to/from Stand Sit to Stand: Supervision                  Balance Overall balance assessment: Needs assistance Sitting-balance support: Feet supported Sitting balance-Leahy Scale: Good     Standing balance support: Bilateral upper extremity supported Standing balance-Leahy Scale: Fair                             ADL either performed or assessed with clinical judgement   ADL Overall ADL's : Needs assistance/impaired                     Lower Body Dressing: Minimal assistance Lower Body Dressing Details (indicate cue type and reason): surgical shoe seated at edge of bed; step by step vcs for technique Toilet Transfer: Supervision/safety;Ambulation;Rolling walker (2 wheels)                   Vision Baseline Vision/History: 1 Wears glasses Patient Visual Report: No change from baseline       Perception     Praxis      Pertinent Vitals/Pain Pain Assessment Pain Assessment: No/denies pain     Hand Dominance     Extremity/Trunk Assessment Upper Extremity Assessment Upper Extremity Assessment: Overall WFL for tasks assessed   Lower Extremity  Assessment Lower Extremity Assessment: Generalized weakness   Cervical / Trunk Assessment Cervical / Trunk Assessment: Normal   Communication Communication Communication: HOH   Cognition Arousal/Alertness: Awake/alert Behavior During Therapy: WFL for tasks assessed/performed Overall Cognitive Status: Within Functional Limits for tasks assessed                                       General Comments  unable to get a reading for  Sp02 after walking on room air (tried on more than one finger). no significant shortness of breath noted durig ambulation. encouraged patient to use pursed lip breathing techniques if having dyspnea with exertion in the future    Exercises     Shoulder Instructions      Home Living Family/patient expects to be discharged to:: Private residence Living Arrangements: Alone Available Help at Discharge: Family;Personal care attendant;Available PRN/intermittently (PCA 2 hrs per day, can increase if needed) Type of Home: House Home Access: Stairs to enter [employer] of Steps: 4 Entrance Stairs-Rails: Left Home Layout: One level     Bathroom Shower/Tub: Walk-in shower;Tub/shower unit   Bathroom Toilet: Standard     Home Equipment: Conservation officer, nature (2 wheels);Rollator (4 wheels);Shower seat          Prior Functioning/Environment               Mobility Comments: amb with rollator, fall about 2 months ago ADLs Comments: pt performs ADL with SET UP, aids assit with IADLs        OT Problem List: Decreased strength;Decreased activity tolerance;Decreased knowledge of use of DME or AE      OT Treatment/Interventions: Self-care/ADL training;Patient/family education;Therapeutic exercise;Therapeutic activities;Energy conservation;DME and/or AE instruction;Balance training    OT Goals(Current goals can be found in the care plan section) Acute Rehab OT Goals Patient Stated Goal: go home OT Goal Formulation: With patient Time For Goal Achievement: 07/21/22 Potential to Achieve Goals: Good  OT Frequency: Min 2X/week    Co-evaluation              AM-PAC OT "6 Clicks" Daily Activity     Outcome Measure Help from another person eating meals?: None Help from another person taking care of personal grooming?: None Help from another person toileting, which includes using toliet, bedpan, or urinal?: A Little Help from another person bathing (including washing, rinsing,  drying)?: A Lot Help from another person to put on and taking off regular upper body clothing?: A Little Help from another person to put on and taking off regular lower body clothing?: A Little 6 Click Score: 19   End of Session Equipment Utilized During Treatment: Rolling walker (2 wheels);Oxygen Nurse Communication: Mobility status  Activity Tolerance: Patient tolerated treatment well Patient left: in chair;with call bell/phone within reach  OT Visit Diagnosis: Unsteadiness on feet (R26.81);Muscle weakness (generalized) (M62.81)                Time: 6503-5465 OT Time Calculation (min): 11 min Charges:  OT General Charges $OT Visit: 1 Visit OT Evaluation $OT Re-eval: 1 Re-eval  Shanon Payor, OTD OTR/L  07/07/22, 11:46 AM

## 2022-07-07 NOTE — Assessment & Plan Note (Signed)
Continue Synthroid °

## 2022-07-08 ENCOUNTER — Telehealth: Payer: Self-pay | Admitting: Podiatry

## 2022-07-08 DIAGNOSIS — J441 Chronic obstructive pulmonary disease with (acute) exacerbation: Secondary | ICD-10-CM | POA: Diagnosis not present

## 2022-07-08 LAB — CBC
HCT: 31.9 % — ABNORMAL LOW (ref 36.0–46.0)
Hemoglobin: 9.8 g/dL — ABNORMAL LOW (ref 12.0–15.0)
MCH: 28.3 pg (ref 26.0–34.0)
MCHC: 30.7 g/dL (ref 30.0–36.0)
MCV: 92.2 fL (ref 80.0–100.0)
Platelets: 298 10*3/uL (ref 150–400)
RBC: 3.46 MIL/uL — ABNORMAL LOW (ref 3.87–5.11)
RDW: 14.5 % (ref 11.5–15.5)
WBC: 9.6 10*3/uL (ref 4.0–10.5)
nRBC: 0 % (ref 0.0–0.2)

## 2022-07-08 LAB — BASIC METABOLIC PANEL
Anion gap: 4 — ABNORMAL LOW (ref 5–15)
BUN: 10 mg/dL (ref 8–23)
CO2: 32 mmol/L (ref 22–32)
Calcium: 8.7 mg/dL — ABNORMAL LOW (ref 8.9–10.3)
Chloride: 104 mmol/L (ref 98–111)
Creatinine, Ser: 0.8 mg/dL (ref 0.44–1.00)
GFR, Estimated: >60 mL/min (ref >60)
Glucose, Bld: 107 mg/dL — ABNORMAL HIGH (ref 70–99)
Potassium: 4 mmol/L (ref 3.5–5.1)
Sodium: 140 mmol/L (ref 135–145)

## 2022-07-08 MED ORDER — METOPROLOL TARTRATE 50 MG PO TABS: 50.0000 mg | ORAL_TABLET | Freq: Two times a day (BID) | ORAL | 0 refills | Status: DC

## 2022-07-08 MED ORDER — DOXYCYCLINE HYCLATE 100 MG PO TABS: 100.0000 mg | ORAL_TABLET | Freq: Two times a day (BID) | ORAL | 0 refills | Status: DC

## 2022-07-08 MED ORDER — APIXABAN 5 MG PO TABS: 5.0000 mg | ORAL_TABLET | Freq: Two times a day (BID) | ORAL | 0 refills | Status: DC

## 2022-07-08 MED ORDER — DOXYCYCLINE HYCLATE 100 MG PO TABS
100.0000 mg | ORAL_TABLET | Freq: Two times a day (BID) | ORAL | 0 refills | Status: AC
Start: 2022-07-08 — End: 2022-07-15

## 2022-07-08 MED ORDER — HYDROCODONE-ACETAMINOPHEN 10-325 MG PO TABS: 1.0000 | ORAL_TABLET | ORAL | 0 refills | Status: DC | PRN

## 2022-07-08 MED ORDER — ASPIRIN 81 MG PO TBEC: 81.0000 mg | DELAYED_RELEASE_TABLET | Freq: Every day | ORAL | 0 refills | Status: AC

## 2022-07-08 MED ORDER — SORBITOL 70 % SOLN
960.0000 mL | TOPICAL_OIL | Freq: Once | ORAL | Status: AC
  Administered 2022-07-08: 960 mL via RECTAL
  Filled 2022-07-08: qty 473

## 2022-07-08 NOTE — Telephone Encounter (Signed)
Patient is status post right 5th toe amputation 8/16 with Dr. Sherryle Lis and is being discharged. She was last seen by Dr. Amalia Hailey on 8/18 in the hospital and cleared for discharge. Please arrange follow up with Dr. Sherryle Lis, likely in the [place] office. Thank you.

## 2022-07-08 NOTE — Discharge Summary (Signed)
Physician Discharge Summary  [name] ZOX:[id number] DOB: [birth year] DOA: 06/29/2022  PCP: Albina Billet, MD  Admit date: 06/29/2022  Discharge date: 07/08/2022  Admitted From:Home  Disposition:  Home  Recommendations for Outpatient Follow-up:  Follow up with PCP in 1-2 weeks Follow-up with podiatry Dr. Sherryle Lis in [place] office which will be arranged by podiatry Follow-up with vascular surgery in 4 weeks as arranged Continue Eliquis and aspirin as prescribed Continue doxycycline for the next 7 days to complete course of treatment Norco 10/325 mg prescribed for pain management with 0 refills Continue other home medications as prior  Home Health: Home health PT/OT  Equipment/Devices: Has home nasal cannula oxygen  Discharge Condition:Stable  CODE STATUS: Full  Diet recommendation: Heart Healthy  Brief/Interim Summary: [name] is a 76 y.o. Caucasian female with medical history significant for CAD, COPD, depression, dyslipidemia, hypertension and hypothyroidism, who presented to the ER 06/29/2022 with acute onset of worsening dyspnea with associated cough productive of yellow sputum as well as wheezing over the last 4 to 5 days.  Also complaining of foot pain and ulceration, 08/10: Tachycardic 126, tachypneic 26, on baseline 2 L [place] at home and was requiring 4 L O2 [place].  Hypokalemia 2.8, creatinine 1.17, troponins flat at 13 and 14, lactic acid 2.5 trended down to 1.3, procalcitonin less than 0.1, leukocytosis 13.3.  CXR concerning for bronchial thickening due to acute bronchitis/COPD exacerbation.  No DVT on ultrasound, CT head no concerns, XR left foot no acute concerns, XR right foot concerning for osteomyelitis.  Patient was started on IV antibiotics with cefepime, Flagyl, vancomycin.  500 mL NS bolus.  IV magnesium sulfate, Solu-Medrol, Xanax.  Admitted to hospitalist service with COPD exacerbation with acute on chronic hypoxic respiratory failure, sepsis due to  osteomyelitis fifth toe of right foot to continue on vancomycin and Zosyn and podiatry consult obtained, electrolyte derangement including hypokalemia 08/11: MRI R foot (+) osteomyelitis of the distal phalanx of the fifth toe, osteonecrosis involving the second, fourth, and fifth metatarsal heads. Bone marrow edema distal phalanx of the great toe posttraumatic vs early acute osteomyelitis.  Podiatry consult: Continue IV ABX, weightbearing as tolerated, ABI ordered, recommending vascular consult and pending vascular studies may need to consider amputation after possible angiography versus sooner if necrosis/sepsis 08/12: vascular surgery consult: angiography Monday 08/14.  Leukocytosis likely due to steroids. Sinus tach confirmed on EKG, added BB given HTN. SPO2 at goal on 2 to 4 L [place]. One of 3 BCx (+)likely contaminant.  08/13: SpO2 at goal on 2L, hypertensive and sinus tach, increased metoprolol.  08/14: RLE Angiogram -  thrombectomy right SFA, popliteal, and anterior tibial arteries and balloon angioplasty right anterior tibial artery and right SFA.  08/15: Podiatry: Planning for right fifth toe amputation planned tentatively for tomorrow 08/16: R 5th toe amputation.  8/17: Requesting stronger pain medicine 8/18: Refusing to leave today as her grandson cannot pick her up.  Her grandson confirmed same and requesting discharge tomorrow morning 8/19: Patient is stable for discharge and will receive enema prior to discharge for bowel movement.  Medications arranged as well as follow-up as noted above.  Completed course of steroids for COPD exacerbation.  Discharge Diagnoses:  Principal Problem:   COPD exacerbation ([place]) Active Problems:   Acute on chronic respiratory failure with hypoxia (HCC)   Osteomyelitis of fifth toe of right foot (HCC)   Hypokalemia   Hypomagnesemia   Essential hypertension   Hypothyroidism   Dyslipidemia   Anxiety and  depression  Principal discharge diagnosis: Acute on  chronic hypoxemic respiratory failure secondary to COPD exacerbation.  Sepsis, present on admission, secondary to osteomyelitis of right fifth toe status post angiography 8/14 and toe amputation 8/16.  Discharge Instructions  Discharge Instructions     Ambulatory referral to Podiatry   Complete by: As directed    Diet - low sodium heart healthy   Complete by: As directed    Increase activity slowly   Complete by: As directed    Leave dressing on - Keep it clean, dry, and intact until clinic visit   Complete by: As directed       Allergies as of 07/08/2022   No Known Allergies      Medication List     STOP taking these medications    buPROPion 150 MG 12 hr tablet Commonly known as: WELLBUTRIN SR   clopidogrel 75 MG tablet Commonly known as: PLAVIX   escitalopram 10 MG tablet Commonly known as: LEXAPRO   nicotine 21 mg/24hr patch Commonly known as: NICODERM CQ - dosed in mg/24 hours   oxyCODONE 5 MG immediate release tablet Commonly known as: Roxicodone       TAKE these medications    albuterol 108 (90 Base) MCG/ACT inhaler Commonly known as: VENTOLIN HFA Inhale 1-2 puffs into the lungs every 4 (four) hours as needed for shortness of breath or wheezing.   ALPRAZolam 1 MG tablet Commonly known as: XANAX Take 1 mg by mouth 3 (three) times daily as needed.   amLODipine 5 MG tablet Commonly known as: NORVASC Take 5 mg by mouth daily.   Aspirin Low Dose 81 MG tablet Generic drug: aspirin EC TAKE 1 TABLET BY MOUTH EVERY DAY   atorvastatin 10 MG tablet Commonly known as: Lipitor Take 1 tablet (10 mg total) by mouth daily.   Combivent Respimat 20-100 MCG/ACT Aers respimat Generic drug: Ipratropium-Albuterol Inhale 1-2 puffs into the lungs 4 (four) times daily.   doxycycline 100 MG tablet Commonly known as: VIBRA-TABS Take 1 tablet (100 mg total) by mouth every 12 (twelve) hours for 7 days.   Eliquis 5 MG Tabs tablet Generic drug: apixaban Take 1  tablet (5 mg total) by mouth 2 (two) times daily.   Fluticasone-Salmeterol 250-50 MCG/DOSE Aepb Commonly known as: ADVAIR Inhale 1 puff into the lungs 2 (two) times daily.   HYDROcodone-acetaminophen 10-325 MG tablet Commonly known as: NORCO Take 1 tablet by mouth every 4 (four) hours as needed for moderate pain or severe pain.   levothyroxine 88 MCG tablet Commonly known as: SYNTHROID Take 88 mcg by mouth daily before breakfast.   metoprolol tartrate 50 MG tablet Commonly known as: LOPRESSOR Take 1 tablet (50 mg total) by mouth 2 (two) times daily.   montelukast 10 MG tablet Commonly known as: SINGULAIR Take 10 mg by mouth daily.   nutrition supplement (JUVEN) Pack Take 1 packet by mouth 2 (two) times daily between meals. What changed: Another medication with the same name was removed. Continue taking this medication, and follow the directions you see here.   polyethylene glycol 17 g packet Commonly known as: MIRALAX / GLYCOLAX Take 17 g by mouth daily.   senna-docusate 8.6-50 MG tablet Commonly known as: Senokot-S Take 2 tablets by mouth at bedtime.   traZODone 150 MG tablet Commonly known as: DESYREL Take 300 mg by mouth at bedtime as needed.   Vitamin D (Ergocalciferol) 1.25 MG (50000 UNIT) Caps capsule Commonly known as: DRISDOL Take 1 capsule (50,000 Units total)  by mouth every 7 (seven) days.               Durable Medical Equipment  (From admission, onward)           Start     Ordered   07/06/22 1400  For home use only DME Walker rolling  Once       Question Answer Comment  Walker: With [place] Wheels   Patient needs a walker to treat with the following condition Difficulty walking      07/06/22 1359              Discharge Care Instructions  (From admission, onward)           Start     Ordered   07/08/22 0000  Leave dressing on - Keep it clean, dry, and intact until clinic visit        07/08/22 0842            Follow-up  Information     Dew, Erskine Squibb, MD Follow up in 4 week(s).   Specialties: Vascular Surgery, Radiology, Interventional Cardiology Why: See JD/FB in four weeks with ABIs Contact information: [place] [address] [phone]         Albina Billet, MD. Schedule an appointment as soon as possible for a visit in 1 week(s).   Specialty: Internal Medicine Contact information: [address] [place] [address] [phone]         Felipa Furnace, DPM. Go to.   Specialty: Podiatry Contact information: [place] [address] [phone]                No Known Allergies  Consultations: Vascular Podiatry   Procedures/Studies: PERIPHERAL VASCULAR CATHETERIZATION  Result Date: 07/03/2022 See surgical note for result.  US ARTERIAL ABI (SCREENING LOWER EXTREMITY)  Result Date: 07/03/2022 CLINICAL DATA:  PAD Former smoker Hypertension Rest pain on the right EXAM: NONINVASIVE PHYSIOLOGIC VASCULAR STUDY OF BILATERAL LOWER EXTREMITIES TECHNIQUE: Evaluation of both lower extremities were performed at rest, including calculation of ankle-brachial indices with single level Doppler and pressure measurements. COMPARISON:  01/11/2021 FINDINGS: Right ABI:  0.53 Left ABI:  0.92 Right Lower Extremity: Monophasic posterior tibial and dorsalis pedis artery waveforms. Left Lower Extremity: Monophasic dorsalis pedis and multiphasic posterior tibial artery waveforms. 0.5-0.79 Moderate PAD IMPRESSION: 1. Moderate right lower extremity peripheral arterial disease. 2. Borderline left lower extremity vertebral disease, however interval change of dorsalis pedis artery waveform from multiphasic to monophasic suspicious for progressive arterial occlusive disease. Electronically Signed   By: Miachel Roux M.D.   On: 07/03/2022 08:07   MR FOOT RIGHT W WO CONTRAST  Result Date: 06/30/2022 CLINICAL DATA:  Discoloration of toes.  Abnormal x-ray EXAM: MRI OF THE  RIGHT FOREFOOT WITHOUT AND WITH CONTRAST TECHNIQUE: Multiplanar, multisequence MR imaging of the right forefoot was performed before and after the administration of intravenous contrast. CONTRAST:  11m GADAVIST GADOBUTROL 1 MMOL/ML IV SOLN COMPARISON:  X-ray 06/29/2022 FINDINGS: Bones/Joint/Cartilage Status post second, third, and fourth toe amputations. There is bone marrow edema and enhancement within the distal phalanx of the fifth toe with confluent low T1 marrow signal change compatible with acute osteomyelitis. Bone marrow edema and mild enhancement is also present throughout the distal phalanx of the great toe with some intermediate T1 bone marrow signal, which could be posttraumatic or represent early acute osteomyelitis. Changes of osteonecrosis involving the second, fourth, and fifth metatarsal heads. Osseous  structures appear otherwise intact. No malalignment. Ligaments Intact Lisfranc ligament.  No acute collateral ligament injury. Muscles and Tendons Post amputation changes within the musculotendinous structures related to the second through fourth toes. Chronic fatty infiltration of the musculature. No tenosynovial fluid collection. Soft tissues Soft tissue edema of the fifth toe.  No fluid collections. IMPRESSION: 1. Acute osteomyelitis of the distal phalanx of the fifth toe. 2. Bone marrow edema and mild enhancement throughout the distal phalanx of the great toe, which could be posttraumatic or represent early acute osteomyelitis. 3. Status post second, third, and fourth toe amputations. 4. Changes of osteonecrosis involving the second, fourth, and fifth metatarsal heads. Electronically Signed   By: Davina Poke D.O.   On: 06/30/2022 10:24   DG Foot Complete Left  Result Date: 06/29/2022 CLINICAL DATA:  toe discolation EXAM: LEFT FOOT - COMPLETE 3+ VIEW COMPARISON:  None Available. FINDINGS: No cortical erosion or destruction. Cortical irregularity along the head of the second digit proximal  phalanx likely old healed fracture. There is no evidence of fracture or dislocation. There is no evidence of arthropathy or other focal bone abnormality. Soft tissues are unremarkable. IMPRESSION: 1. No cortical erosion or destruction to suggest osteomyelitis of the left foot. 2. No definite acute displaced fracture or dislocation. Electronically Signed   By: Iven Finn M.D.   On: 06/29/2022 23:30   DG Foot Complete Right  Result Date: 06/29/2022 CLINICAL DATA:  toe discolation EXAM: RIGHT FOOT COMPLETE - 3+ VIEW COMPARISON:  X-ray right foot 05/07/2022 FINDINGS: Query interval development of cortical erosion of the fifth digit distal phalanx. Query prior second, third, fourth toe phalangeal amputation. Chronic cortical irregularity/nonaggressive periosteal reaction along the fourth metatarsal body and neck. No cortical erosion or destruction. There is no evidence of fracture or dislocation. Subcutaneus soft tissue edema. IMPRESSION: Query interval development of cortical erosion of the fifth digit distal phalanx. Consider MRI (with contrast if GFR greater than 30) for further evaluation of possible underlying osteomyelitis. Electronically Signed   By: Iven Finn M.D.   On: 06/29/2022 23:28   CT HEAD WO CONTRAST (5MM)  Result Date: 06/29/2022 CLINICAL DATA:  Mental status change, unknown cause EXAM: CT HEAD WITHOUT CONTRAST TECHNIQUE: Contiguous axial images were obtained from the base of the skull through the vertex without intravenous contrast. RADIATION DOSE REDUCTION: This exam was performed according to the departmental dose-optimization program which includes automated exposure control, adjustment of the mA and/or kV according to patient size and/or use of iterative reconstruction technique. COMPARISON:  Head CT 05/07/2022 FINDINGS: Brain: No intracranial hemorrhage, mass effect, or midline shift. Stable degree of atrophy. No hydrocephalus. The basilar cisterns are patent. Stable  periventricular and deep white matter hypodensity typical of chronic small vessel ischemic change. No evidence of territorial infarct or acute ischemia. No extra-axial or intracranial fluid collection. Vascular: Atherosclerosis of skullbase vasculature without hyperdense vessel or abnormal calcification. Skull: No fracture or focal lesion. Sinuses/Orbits: No acute findings. The paranasal sinuses are clear. No mastoid effusion. Bilateral cataract resection. Other: None. IMPRESSION: 1. No acute intracranial abnormality. 2. Stable atrophy and chronic small vessel ischemic change. Electronically Signed   By: Keith Rake M.D.   On: 06/29/2022 22:08   US Venous Img Lower Unilateral Right  Result Date: 06/29/2022 CLINICAL DATA:  Initial evaluation for acute leg swelling. EXAM: RIGHT LOWER EXTREMITY VENOUS DOPPLER ULTRASOUND TECHNIQUE: Gray-scale sonography with graded compression, as well as color Doppler and duplex ultrasound were performed to evaluate the lower extremity deep venous  systems from the level of the common femoral vein and including the common femoral, femoral, profunda femoral, popliteal and calf veins including the posterior tibial, peroneal and gastrocnemius veins when visible. The superficial great saphenous vein was also interrogated. Spectral Doppler was utilized to evaluate flow at rest and with distal augmentation maneuvers in the common femoral, femoral and popliteal veins. COMPARISON:  None Available. FINDINGS: Contralateral Common Femoral Vein: Respiratory phasicity is normal and symmetric with the symptomatic side. No evidence of thrombus. Normal compressibility. Common Femoral Vein: No evidence of thrombus. Normal compressibility, respiratory phasicity and response to augmentation. Saphenofemoral Junction: No evidence of thrombus. Normal compressibility and flow on color Doppler imaging. Profunda Femoral Vein: No evidence of thrombus. Normal compressibility and flow on color Doppler  imaging. Femoral Vein: No evidence of thrombus. Normal compressibility, respiratory phasicity and response to augmentation. Popliteal Vein: No evidence of thrombus. Normal compressibility, respiratory phasicity and response to augmentation. Calf Veins: No evidence of thrombus. Normal compressibility and flow on color Doppler imaging. Superficial Great Saphenous Vein: No evidence of thrombus. Normal compressibility. Venous Reflux:  None. Other Findings:  None. IMPRESSION: No evidence of deep venous thrombosis. Electronically Signed   By: Jeannine Boga M.D.   On: 06/29/2022 21:44   DG Chest Portable 1 View  Result Date: 06/29/2022 CLINICAL DATA:  Shortness of breath. EXAM: PORTABLE CHEST 1 VIEW COMPARISON:  Radiograph 05/07/2022 FINDINGS: The lungs are hyperinflated. There is increased bronchial thickening from prior exam. The heart is normal in size with normal mediastinal contours. Aortic atherosclerosis. No acute airspace disease. No pneumothorax, large pleural effusion, or evidence of pulmonary edema. Chronic change of the right shoulder. IMPRESSION: Increased bronchial thickening from prior exam, may be acute bronchitis or COPD exacerbation. Electronically Signed   By: Keith Rake M.D.   On: 06/29/2022 20:08     Discharge Exam: Vitals:   07/08/22 0409 07/08/22 0750  BP: 126/70 136/77  Pulse: 71 76  Resp: 20   Temp: 98 F (36.7 C) 97.7 F (36.5 C)  SpO2: 97% 98%   Vitals:   07/07/22 2111 07/08/22 0406 07/08/22 0409 07/08/22 0750  BP: 130/61  126/70 136/77  Pulse: 87 70 71 76  Resp: 16  20   Temp: 98.1 F (36.7 C)  98 F (36.7 C) 97.7 F (36.5 C)  TempSrc:   Oral Oral  SpO2: 100% 96% 97% 98%  Weight:      Height:        General: Pt is alert, awake, not in acute distress Cardiovascular: RRR, S1/S2 +, no rubs, no gallops Respiratory: CTA bilaterally, no wheezing, no rhonchi, 1 L nasal cannula Abdominal: Soft, NT, ND, bowel sounds + Extremities: Right foot in dressings  clean dry and intact    The results of significant diagnostics from this hospitalization (including imaging, microbiology, ancillary and laboratory) are listed below for reference.     Microbiology: Recent Results (from the past 240 hour(s))  Blood culture (routine x 2)     Status: Abnormal   Collection Time: 06/29/22  7:53 PM   Specimen: BLOOD LEFT FOREARM  Result Value Ref Range Status   Specimen Description   Final    BLOOD LEFT FOREARM Performed at [facility], [address], [place], [place] [address]    Special Requests   Final    IN PEDIATRIC BOTTLE Blood Culture adequate volume Performed at [facility], [address], [place],  [address]    Culture  Setup Time   Final  GRAM POSITIVE RODS IN PEDIATRIC BOTTLE CRITICAL RESULT CALLED TO, READ BACK BY AND VERIFIED WITH: WILL ANDERSON ON 07/01/22 AT 1249 QSD Performed at [facility], [address] [place], [place] [address]    Culture (A)  Final    DIPHTHEROIDS(CORYNEBACTERIUM SPECIES) Standardized susceptibility testing for this organism is not available. Performed at [facility], [place] [address], [place], [place] [address]    Report Status 07/02/2022 FINAL  Final  SARS Coronavirus 2 by RT PCR (hospital order, performed in [facility] hospital lab) *cepheid single result test* Anterior Nasal Swab     Status: None   Collection Time: 06/29/22  7:53 PM   Specimen: Anterior Nasal Swab  Result Value Ref Range Status   SARS Coronavirus 2 by RT PCR NEGATIVE NEGATIVE Final    Comment: (NOTE) SARS-CoV-2 target nucleic acids are NOT DETECTED.  The SARS-CoV-2 RNA is generally detectable in upper and lower respiratory specimens during the acute phase of infection. The lowest concentration of SARS-CoV-2 viral copies this assay can detect is 250 copies / mL. A negative result does not preclude SARS-CoV-2 infection and should not be used as the sole basis for treatment or other patient  management decisions.  A negative result may occur with improper specimen collection / handling, submission of specimen other than nasopharyngeal swab, presence of viral mutation(s) within the areas targeted by this assay, and inadequate number of viral copies (<250 copies / mL). A negative result must be combined with clinical observations, patient history, and epidemiological information.  Fact Sheet for Patients:   https://www.patel.info/  Fact Sheet for Healthcare Providers: https://hall.com/  This test is not yet approved or  cleared by the [place] FDA and has been authorized for detection and/or diagnosis of SARS-CoV-2 by FDA under an Emergency Use Authorization (EUA).  This EUA will remain in effect (meaning this test can be used) for the duration of the COVID-19 declaration under Section 564(b)(1) of the Act, 21 U.S.C. section 360bbb-3(b)(1), unless the authorization is terminated or revoked sooner.  Performed at [facility], [address] [place], [place] [address]   Culture, blood (Routine X 2) w Reflex to ID Panel     Status: None   Collection Time: 06/29/22  9:20 PM   Specimen: BLOOD  Result Value Ref Range Status   Specimen Description BLOOD BLOOD LEFT FOREARM  Final   Special Requests   Final    BOTTLES DRAWN AEROBIC AND ANAEROBIC Blood Culture adequate volume   Culture   Final    NO GROWTH 5 DAYS Performed at [facility], [address] [place], [place] [address]    Report Status 07/04/2022 FINAL  Final  Urine Culture     Status: None   Collection Time: 06/29/22 11:21 PM   Specimen: Urine, Random  Result Value Ref Range Status   Specimen Description   Final    URINE, RANDOM Performed at [facility], [address] [place], [place] [address]    Special Requests   Final    NONE Performed at [facility], [address], [place], [place] [address]    Culture    Final    NO GROWTH Performed at [facility], [place] [address] [place],  [address]    Report Status 07/01/2022 FINAL  Final  MRSA Next Gen by PCR, Nasal     Status: Abnormal   Collection Time: 07/07/22  1:40 PM   Specimen: Nasal Mucosa; Nasal Swab  Result Value Ref Range Status   MRSA by PCR Next Gen DETECTED (A) NOT DETECTED Final    Comment: RESULT CALLED TO, READ BACK BY AND VERIFIED WITH: JARESE GRIFFITHS AT 1517 07/07/22.PMF (NOTE) The GeneXpert MRSA Assay (FDA approved for NASAL specimens only), is one component of a comprehensive MRSA colonization surveillance program. It is not intended to diagnose MRSA infection nor to guide or monitor treatment for MRSA infections. Test performance is not FDA approved in patients less than 38 years old. Performed at [facility], [address] [place], [place] [address]      Labs: BNP (last 3 results) Recent Labs    11/20/21 1911 06/29/22 2118  BNP 49.9 24.8   Basic Metabolic Panel: Recent Labs  Lab 07/03/22 0433 07/04/22 0434 07/06/22 0447 07/07/22 0513 07/08/22 0603  NA  --  140  --  143 140  K  --  3.7  --  3.8 4.0  CL  --  108  --  107 104  CO2  --  27  --  32 32  GLUCOSE  --  74  --  100* 107*  BUN  --  13  --  8 10  CREATININE 0.75 0.81 0.62 0.76 0.80  CALCIUM  --  7.8*  --  8.3* 8.7*   Liver Function Tests: No results for input(s): "AST", "ALT", "ALKPHOS", "BILITOT", "PROT", "ALBUMIN" in the last 168 hours. No results for input(s): "LIPASE", "AMYLASE" in the last 168 hours. No results for input(s): "AMMONIA" in the last 168 hours. CBC: Recent Labs  Lab 07/04/22 0434 07/07/22 0513 07/08/22 0603  WBC 8.4 9.3 9.6  HGB 9.6* 9.4* 9.8*  HCT 30.2* 30.3* 31.9*  MCV 89.6 90.4 92.2  PLT 219 283 298   Cardiac Enzymes: No results for input(s): "CKTOTAL", "CKMB", "CKMBINDEX", "TROPONINI" in the last 168 hours. BNP: Invalid input(s): "POCBNP" CBG: Recent Labs  Lab 07/02/22 2328  GLUCAP  107*   D-Dimer No results for input(s): "DDIMER" in the last 72 hours. Hgb A1c No results for input(s): "HGBA1C" in the last 72 hours. Lipid Profile No results for input(s): "CHOL", "HDL", "LDLCALC", "TRIG", "CHOLHDL", "LDLDIRECT" in the last 72 hours. Thyroid function studies No results for input(s): "TSH", "T4TOTAL", "T3FREE", "THYROIDAB" in the last 72 hours.  Invalid input(s): "FREET3" Anemia work up No results for input(s): "VITAMINB12", "FOLATE", "FERRITIN", "TIBC", "IRON", "RETICCTPCT" in the last 72 hours. Urinalysis    Component Value Date/Time   COLORURINE YELLOW (A) 06/29/2022 2358   APPEARANCEUR HAZY (A) 06/29/2022 2358   LABSPEC 1.017 06/29/2022 2358   PHURINE 5.0 06/29/2022 2358   GLUCOSEU 50 (A) 06/29/2022 2358   HGBUR MODERATE (A) 06/29/2022 2358   BILIRUBINUR NEGATIVE 06/29/2022 2358   KETONESUR 5 (A) 06/29/2022 2358   PROTEINUR 30 (A) 06/29/2022 2358   NITRITE NEGATIVE 06/29/2022 2358   LEUKOCYTESUR NEGATIVE 06/29/2022 2358   Sepsis Labs Recent Labs  Lab 07/04/22 0434 07/07/22 0513 07/08/22 0603  WBC 8.4 9.3 9.6   Microbiology Recent Results (from the past 240 hour(s))  Blood culture (routine x 2)     Status: Abnormal   Collection Time: 06/29/22  7:53 PM   Specimen: BLOOD LEFT FOREARM  Result Value Ref Range Status   Specimen Description   Final    BLOOD LEFT FOREARM Performed at [facility], [place] [address], [place], [place] [address]    Special Requests   Final    IN PEDIATRIC BOTTLE Blood Culture adequate volume Performed at [facility]  [facility], [address], [place], [place] [address]    Culture  Setup Time   Final    GRAM POSITIVE RODS IN PEDIATRIC BOTTLE CRITICAL RESULT CALLED TO, READ BACK BY AND VERIFIED WITH: WILL ANDERSON ON 07/01/22 AT 1249 QSD Performed at [facility], [address] [place], [place] [address]    Culture (A)  Final    DIPHTHEROIDS(CORYNEBACTERIUM SPECIES) Standardized susceptibility testing  for this organism is not available. Performed at [facility], [place] [address], [place], [place] [address]    Report Status 07/02/2022 FINAL  Final  SARS Coronavirus 2 by RT PCR (hospital order, performed in [facility] hospital lab) *cepheid single result test* Anterior Nasal Swab     Status: None   Collection Time: 06/29/22  7:53 PM   Specimen: Anterior Nasal Swab  Result Value Ref Range Status   SARS Coronavirus 2 by RT PCR NEGATIVE NEGATIVE Final    Comment: (NOTE) SARS-CoV-2 target nucleic acids are NOT DETECTED.  The SARS-CoV-2 RNA is generally detectable in upper and lower respiratory specimens during the acute phase of infection. The lowest concentration of SARS-CoV-2 viral copies this assay can detect is 250 copies / mL. A negative result does not preclude SARS-CoV-2 infection and should not be used as the sole basis for treatment or other patient management decisions.  A negative result may occur with improper specimen collection / handling, submission of specimen other than nasopharyngeal swab, presence of viral mutation(s) within the areas targeted by this assay, and inadequate number of viral copies (<250 copies / mL). A negative result must be combined with clinical observations, patient history, and epidemiological information.  Fact Sheet for Patients:   https://www.patel.info/  Fact Sheet for Healthcare Providers: https://hall.com/  This test is not yet approved or  cleared by the [place] FDA and has been authorized for detection and/or diagnosis of SARS-CoV-2 by FDA under an Emergency Use Authorization (EUA).  This EUA will remain in effect (meaning this test can be used) for the duration of the COVID-19 declaration under Section 564(b)(1) of the Act, 21 U.S.C. section 360bbb-3(b)(1), unless the authorization is terminated or revoked sooner.  Performed at [facility], [address]  [place], [place] [address]   Culture, blood (Routine X 2) w Reflex to ID Panel     Status: None   Collection Time: 06/29/22  9:20 PM   Specimen: BLOOD  Result Value Ref Range Status   Specimen Description BLOOD BLOOD LEFT FOREARM  Final   Special Requests   Final    BOTTLES DRAWN AEROBIC AND ANAEROBIC Blood Culture adequate volume   Culture   Final    NO GROWTH 5 DAYS Performed at [facility], [address] [place], [place] [address]    Report Status 07/04/2022 FINAL  Final  Urine Culture     Status: None   Collection Time: 06/29/22 11:21 PM   Specimen: Urine, Random  Result Value Ref Range Status   Specimen Description   Final    URINE, RANDOM Performed at [facility], [address], [place], [place] [address]    Special Requests   Final    NONE Performed at [facility], [address] [place], [place] [address]    Culture   Final    NO GROWTH Performed at [facility], [place] [address], [place], [place] [address]    Report Status 07/01/2022 FINAL  Final  MRSA Next Gen by PCR, Nasal  Status: Abnormal   Collection Time: 07/07/22  1:40 PM   Specimen: Nasal Mucosa; Nasal Swab  Result Value Ref Range Status   MRSA by PCR Next Gen DETECTED (A) NOT DETECTED Final    Comment: RESULT CALLED TO, READ BACK BY AND VERIFIED WITH: JARESE GRIFFITHS AT 1517 07/07/22.PMF (NOTE) The GeneXpert MRSA Assay (FDA approved for NASAL specimens only), is one component of a comprehensive MRSA colonization surveillance program. It is not intended to diagnose MRSA infection nor to guide or monitor treatment for MRSA infections. Test performance is not FDA approved in patients less than 1 years old. Performed at [facility], [address] [place], [place] [address]      Time coordinating discharge: 35 minutes  SIGNED:   Rodena Goldmann, DO [facility] 07/08/2022, 9:54 AM  If 7PM-7AM, please contact  night-coverage [email or web address]

## 2022-07-12 ENCOUNTER — Telehealth: Payer: Self-pay | Admitting: Podiatry

## 2022-07-12 ENCOUNTER — Encounter: Payer: Self-pay | Admitting: Podiatry

## 2022-07-12 NOTE — Telephone Encounter (Signed)
Patient would like a refill on pain meds

## 2022-07-20 ENCOUNTER — Ambulatory Visit: Payer: Medicare HMO | Admitting: Podiatry

## 2022-07-20 DIAGNOSIS — Z89422 Acquired absence of other left toe(s): Secondary | ICD-10-CM

## 2022-07-20 MED ORDER — OXYCODONE-ACETAMINOPHEN 5-325 MG PO TABS: 1.0000 | ORAL_TABLET | ORAL | 0 refills | Status: DC | PRN

## 2022-07-28 NOTE — Progress Notes (Signed)
Subjective:  Patient ID: [name], female    DOB: October 04, 1946,  MRN: [id number]  Chief Complaint  Patient presents with   Routine Post Op    DOS: 07/05/2022 Procedure: Right fifth digit amputation  76 y.o. female returns for post-op check.  Patient dates she is doing okay.  Minimal complaints minimal pain.  No signs of dehiscence or infection noted.  Good color noted around the incision site.  Review of Systems: Negative except as noted in the HPI. Denies N/V/F/Ch.  Past Medical History:  Diagnosis Date   Anxiety    CAD (coronary artery disease)    Carpal tunnel syndrome    Cataract    Complication of anesthesia    has woken up at times   COPD (chronic obstructive pulmonary disease) (HCC)    CRP elevated 09/14/2015   Depression    Difficult intubation    Dyslipidemia    Dyspnea    DOE   Dysrhythmia    hx palpatations   Elevated sedimentation rate 09/14/2015   Esophageal spasm    Gastrointestinal parasites    GERD (gastroesophageal reflux disease)    HCAP (healthcare-associated pneumonia) 10/22/2018   Hiatal hernia    History of peptic ulcer disease    Hypertension    Hyperthyroidism    Hypothyroidism    Low magnesium levels 09/14/2015   Pelvic fracture ([place]) 2008   fall from riding a horse   Peripheral vascular disease (HCC)    Reflux    Rotator cuff injury    Sepsis ([place]) 10/22/2018   Stenosis, spinal, lumbar     Current Outpatient Medications:    oxyCODONE-acetaminophen (PERCOCET) 5-325 MG tablet, Take 1 tablet by mouth every 4 (four) hours as needed for severe pain., Disp: 30 tablet, Rfl: 0   albuterol (VENTOLIN HFA) 108 (90 Base) MCG/ACT inhaler, Inhale 1-2 puffs into the lungs every 4 (four) hours as needed for shortness of breath or wheezing., Disp: , Rfl:    ALPRAZolam (XANAX) 1 MG tablet, Take 1 mg by mouth 3 (three) times daily as needed., Disp: , Rfl:    amLODipine (NORVASC) 5 MG tablet, Take 5 mg by mouth daily. (Patient not taking: Reported on  06/29/2022), Disp: , Rfl:    apixaban (ELIQUIS) 5 MG TABS tablet, Take 1 tablet (5 mg total) by mouth 2 (two) times daily., Disp: 60 tablet, Rfl: 0   aspirin EC (ASPIRIN LOW DOSE) 81 MG tablet, Take 1 tablet (81 mg total) by mouth daily. Swallow whole., Disp: 30 tablet, Rfl: 0   atorvastatin (LIPITOR) 10 MG tablet, Take 1 tablet (10 mg total) by mouth daily. (Patient not taking: Reported on 06/29/2022), Disp: 30 tablet, Rfl: 11   COMBIVENT RESPIMAT 20-100 MCG/ACT AERS respimat, Inhale 1-2 puffs into the lungs 4 (four) times daily., Disp: 4 g, Rfl: 0   Fluticasone-Salmeterol (ADVAIR) 250-50 MCG/DOSE AEPB, Inhale 1 puff into the lungs 2 (two) times daily. (Patient not taking: Reported on 06/29/2022), Disp: 60 each, Rfl: 0   HYDROcodone-acetaminophen (NORCO) 10-325 MG tablet, Take 1 tablet by mouth every 4 (four) hours as needed for moderate pain or severe pain., Disp: 15 tablet, Rfl: 0   levothyroxine (SYNTHROID, LEVOTHROID) 88 MCG tablet, Take 88 mcg by mouth daily before breakfast.  (Patient not taking: Reported on 06/29/2022), Disp: , Rfl:    metoprolol tartrate (LOPRESSOR) 50 MG tablet, Take 1 tablet (50 mg total) by mouth 2 (two) times daily., Disp: 60 tablet, Rfl: 0   montelukast (SINGULAIR) 10 MG tablet, Take  10 mg by mouth daily. (Patient not taking: Reported on 06/29/2022), Disp: , Rfl:    nutrition supplement, JUVEN, (JUVEN) PACK, Take 1 packet by mouth 2 (two) times daily between meals. (Patient not taking: Reported on 06/29/2022), Disp: 60 packet, Rfl: 0   polyethylene glycol (MIRALAX / GLYCOLAX) 17 g packet, Take 17 g by mouth daily. (Patient not taking: Reported on 06/29/2022), Disp: 30 each, Rfl: 0   senna-docusate (SENOKOT-S) 8.6-50 MG tablet, Take 2 tablets by mouth at bedtime. (Patient not taking: Reported on 06/29/2022), Disp: 60 tablet, Rfl: 0   traZODone (DESYREL) 150 MG tablet, Take 300 mg by mouth at bedtime as needed., Disp: , Rfl:    Vitamin D, Ergocalciferol, (DRISDOL) 1.25 MG (50000  UNIT) CAPS capsule, Take 1 capsule (50,000 Units total) by mouth every 7 (seven) days. (Patient not taking: Reported on 06/29/2022), Disp: 4 capsule, Rfl: 0  Social History   Tobacco Use  Smoking Status Some Days   Packs/day: 0.00   Types: Cigarettes  Smokeless Tobacco Never  Tobacco Comments   pt states 2 cigs per month     No Known Allergies Objective:  There were no vitals filed for this visit. There is no height or weight on file to calculate BMI. Constitutional Well developed. Well nourished.  Vascular Foot warm and well perfused. Capillary refill normal to all digits.   Neurologic Normal speech. Oriented to person, place, and time. Epicritic sensation to light touch grossly present bilaterally.  Dermatologic Skin healing well without signs of infection. Skin edges well coapted without signs of infection.  Orthopedic: Tenderness to palpation noted about the surgical site.   Radiographs: None Assessment:   1. History of amputation of lesser toe of left foot ([place])    Plan:  Patient was evaluated and treated and all questions answered.  S/p foot surgery right -Progressing as expected post-operatively. -XR: See above -WB Status: Weightbearing as tolerated in surgical shoe -Sutures: Intact.  Not as of [place] is noted.  No complication noted. -Medications: None -Foot redressed.  No follow-ups on file.

## 2022-07-31 ENCOUNTER — Telehealth: Payer: Self-pay | Admitting: *Deleted

## 2022-07-31 ENCOUNTER — Encounter: Payer: Self-pay | Admitting: *Deleted

## 2022-07-31 NOTE — Telephone Encounter (Signed)
PT w/ [facility] is wanting to let the physician know that patient missed his appointment today, did not answer the door, called caretaker , no answer as well.

## 2022-08-03 ENCOUNTER — Ambulatory Visit: Payer: Medicare HMO | Admitting: Podiatry

## 2022-08-06 NOTE — Progress Notes (Deleted)
No show

## 2022-08-07 ENCOUNTER — Ambulatory Visit: Payer: Medicare HMO | Attending: Cardiovascular Disease | Admitting: Cardiovascular Disease

## 2022-08-07 DIAGNOSIS — I739 Peripheral vascular disease, unspecified: Secondary | ICD-10-CM

## 2022-08-07 DIAGNOSIS — E782 Mixed hyperlipidemia: Secondary | ICD-10-CM

## 2022-08-07 DIAGNOSIS — I70261 Atherosclerosis of native arteries of extremities with gangrene, right leg: Secondary | ICD-10-CM

## 2022-08-07 DIAGNOSIS — J441 Chronic obstructive pulmonary disease with (acute) exacerbation: Secondary | ICD-10-CM

## 2022-08-07 DIAGNOSIS — F17200 Nicotine dependence, unspecified, uncomplicated: Secondary | ICD-10-CM

## 2022-08-07 DIAGNOSIS — J9611 Chronic respiratory failure with hypoxia: Secondary | ICD-10-CM

## 2022-08-24 ENCOUNTER — Ambulatory Visit: Payer: Medicare HMO | Admitting: Podiatry

## 2022-09-05 ENCOUNTER — Ambulatory Visit: Payer: Medicare HMO | Admitting: Podiatry

## 2022-09-06 ENCOUNTER — Telehealth: Payer: Self-pay | Admitting: Podiatry

## 2022-09-06 NOTE — Telephone Encounter (Signed)
Called [name], no answer but did leave a detailed voice message , is ok to remove sutures, wound care is betadine wet to dry.

## 2022-09-06 NOTE — Telephone Encounter (Signed)
[name] w/ [phone]), calling to request verbal orders to remove sutures, updated wound care orders, Current caregivers show up at will and patient is not in a safe environment,,trying to get patient in a better living situation as well. Please advise.

## 2022-09-06 NOTE — Telephone Encounter (Signed)
Spoke with patient giving order to Manuela Schwartz

## 2022-09-06 NOTE — Telephone Encounter (Signed)
Need verbal orders to take out stitches

## 2022-10-19 ENCOUNTER — Ambulatory Visit: Payer: Medicare HMO | Admitting: Podiatry

## 2022-10-19 DIAGNOSIS — L853 Xerosis cutis: Secondary | ICD-10-CM

## 2022-10-19 DIAGNOSIS — Z89422 Acquired absence of other left toe(s): Secondary | ICD-10-CM | POA: Diagnosis not present

## 2022-10-19 MED ORDER — AMMONIUM LACTATE 12 % EX LOTN: 1.0000 | TOPICAL_LOTION | CUTANEOUS | 0 refills | Status: DC | PRN

## 2022-10-19 NOTE — Progress Notes (Signed)
Subjective:  Patient ID: [name], female    DOB: 08/13/1946,  MRN: [id number]  Chief Complaint  Patient presents with   Routine Post Op    DOS: 07/05/2022 Procedure: Right fifth digit amputation  76 y.o. female returns for post-op check.  Patient dates she is doing okay.  The incision has healed.  She has secondary complaint of dry skin to the bilateral lower extremity.  She is tried over-the-counter medication none of which has helped.  She would like to discuss prescription options  Review of Systems: Negative except as noted in the HPI. Denies N/V/F/Ch.  Past Medical History:  Diagnosis Date   Anxiety    CAD (coronary artery disease)    Carpal tunnel syndrome    Cataract    Complication of anesthesia    has woken up at times   COPD (chronic obstructive pulmonary disease) (HCC)    CRP elevated 09/14/2015   Depression    Difficult intubation    Dyslipidemia    Dyspnea    DOE   Dysrhythmia    hx palpatations   Elevated sedimentation rate 09/14/2015   Esophageal spasm    Gastrointestinal parasites    GERD (gastroesophageal reflux disease)    HCAP (healthcare-associated pneumonia) 10/22/2018   Hiatal hernia    History of peptic ulcer disease    Hypertension    Hyperthyroidism    Hypothyroidism    Low magnesium levels 09/14/2015   Pelvic fracture ([place]) 2008   fall from riding a horse   Peripheral vascular disease (HCC)    Reflux    Rotator cuff injury    Sepsis ([place]) 10/22/2018   Stenosis, spinal, lumbar     Current Outpatient Medications:    ammonium lactate (AMLACTIN) 12 % lotion, Apply 1 Application topically as needed for dry skin., Disp: 400 g, Rfl: 0   albuterol (VENTOLIN HFA) 108 (90 Base) MCG/ACT inhaler, Inhale 1-2 puffs into the lungs every 4 (four) hours as needed for shortness of breath or wheezing., Disp: , Rfl:    ALPRAZolam (XANAX) 1 MG tablet, Take 1 mg by mouth 3 (three) times daily as needed., Disp: , Rfl:    amLODipine (NORVASC) 5 MG  tablet, Take 5 mg by mouth daily. (Patient not taking: Reported on 06/29/2022), Disp: , Rfl:    apixaban (ELIQUIS) 5 MG TABS tablet, Take 1 tablet (5 mg total) by mouth 2 (two) times daily., Disp: 60 tablet, Rfl: 0   atorvastatin (LIPITOR) 10 MG tablet, Take 1 tablet (10 mg total) by mouth daily. (Patient not taking: Reported on 06/29/2022), Disp: 30 tablet, Rfl: 11   COMBIVENT RESPIMAT 20-100 MCG/ACT AERS respimat, Inhale 1-2 puffs into the lungs 4 (four) times daily., Disp: 4 g, Rfl: 0   Fluticasone-Salmeterol (ADVAIR) 250-50 MCG/DOSE AEPB, Inhale 1 puff into the lungs 2 (two) times daily. (Patient not taking: Reported on 06/29/2022), Disp: 60 each, Rfl: 0   HYDROcodone-acetaminophen (NORCO) 10-325 MG tablet, Take 1 tablet by mouth every 4 (four) hours as needed for moderate pain or severe pain., Disp: 15 tablet, Rfl: 0   levothyroxine (SYNTHROID, LEVOTHROID) 88 MCG tablet, Take 88 mcg by mouth daily before breakfast.  (Patient not taking: Reported on 06/29/2022), Disp: , Rfl:    metoprolol tartrate (LOPRESSOR) 50 MG tablet, Take 1 tablet (50 mg total) by mouth 2 (two) times daily., Disp: 60 tablet, Rfl: 0   montelukast (SINGULAIR) 10 MG tablet, Take 10 mg by mouth daily. (Patient not taking: Reported on 06/29/2022), Disp: , Rfl:  nutrition supplement, JUVEN, (JUVEN) PACK, Take 1 packet by mouth 2 (two) times daily between meals. (Patient not taking: Reported on 06/29/2022), Disp: 60 packet, Rfl: 0   oxyCODONE-acetaminophen (PERCOCET) 5-325 MG tablet, Take 1 tablet by mouth every 4 (four) hours as needed for severe pain., Disp: 30 tablet, Rfl: 0   polyethylene glycol (MIRALAX / GLYCOLAX) 17 g packet, Take 17 g by mouth daily. (Patient not taking: Reported on 06/29/2022), Disp: 30 each, Rfl: 0   senna-docusate (SENOKOT-S) 8.6-50 MG tablet, Take 2 tablets by mouth at bedtime. (Patient not taking: Reported on 06/29/2022), Disp: 60 tablet, Rfl: 0   traZODone (DESYREL) 150 MG tablet, Take 300 mg by mouth at  bedtime as needed., Disp: , Rfl:    Vitamin D, Ergocalciferol, (DRISDOL) 1.25 MG (50000 UNIT) CAPS capsule, Take 1 capsule (50,000 Units total) by mouth every 7 (seven) days. (Patient not taking: Reported on 06/29/2022), Disp: 4 capsule, Rfl: 0  Social History   Tobacco Use  Smoking Status Some Days   Packs/day: 0.00   Types: Cigarettes  Smokeless Tobacco Never  Tobacco Comments   pt states 2 cigs per month     No Known Allergies Objective:  There were no vitals filed for this visit. There is no height or weight on file to calculate BMI. Constitutional Well developed. Well nourished.  Vascular Foot warm and well perfused. Capillary refill normal to all digits.   Neurologic Normal speech. Oriented to person, place, and time. Epicritic sensation to light touch grossly present bilaterally.  Dermatologic Skin completely epithelialized.  The wound has healed dry skin noted to both lower extremity.  Orthopedic: Tenderness to palpation noted about the surgical site.   Radiographs: None Assessment:   1. Xerosis of skin   2. History of amputation of lesser toe of left foot ([place])    Plan:  Patient was evaluated and treated and all questions answered.  S/p foot surgery right -Clinically healed and officially discharged from my care.  Xerosis bilateral -I explained to the patient the etiology of xerosis and various treatment options were extensively discussed.  I explained to the patient the importance of maintaining moisturization of the skin with application of over-the-counter lotion such as Eucerin or Luciderm.  Given that she has failed all over-the-counter medication she will benefit from prescription lotion ammonium lactate was sent to the pharmacy of asked her to apply twice a day.  She states understanding.   No follow-ups on file.

## 2022-11-08 ENCOUNTER — Observation Stay: Payer: Medicare HMO

## 2022-11-08 ENCOUNTER — Emergency Department: Payer: Medicare HMO

## 2022-11-08 ENCOUNTER — Observation Stay
Admission: EM | Admit: 2022-11-08 | Discharge: 2022-11-10 | Disposition: A | Discharge status: Home / Self Care | Payer: Medicare HMO | Attending: Internal Medicine | Admitting: Internal Medicine

## 2022-11-08 ENCOUNTER — Other Ambulatory Visit: Payer: Self-pay

## 2022-11-08 DIAGNOSIS — R000 Tachycardia, unspecified: Secondary | ICD-10-CM | POA: Insufficient documentation

## 2022-11-08 DIAGNOSIS — E039 Hypothyroidism, unspecified: Secondary | ICD-10-CM | POA: Diagnosis not present

## 2022-11-08 DIAGNOSIS — J441 Chronic obstructive pulmonary disease with (acute) exacerbation: Secondary | ICD-10-CM | POA: Diagnosis not present

## 2022-11-08 DIAGNOSIS — R410 Disorientation, unspecified: Secondary | ICD-10-CM

## 2022-11-08 DIAGNOSIS — F17210 Nicotine dependence, cigarettes, uncomplicated: Secondary | ICD-10-CM | POA: Insufficient documentation

## 2022-11-08 DIAGNOSIS — R636 Underweight: Secondary | ICD-10-CM | POA: Diagnosis not present

## 2022-11-08 DIAGNOSIS — I10 Essential (primary) hypertension: Secondary | ICD-10-CM | POA: Insufficient documentation

## 2022-11-08 DIAGNOSIS — Z1152 Encounter for screening for COVID-19: Secondary | ICD-10-CM | POA: Diagnosis not present

## 2022-11-08 DIAGNOSIS — I2510 Atherosclerotic heart disease of native coronary artery without angina pectoris: Secondary | ICD-10-CM | POA: Diagnosis not present

## 2022-11-08 DIAGNOSIS — I4891 Unspecified atrial fibrillation: Secondary | ICD-10-CM | POA: Diagnosis not present

## 2022-11-08 DIAGNOSIS — R4182 Altered mental status, unspecified: Secondary | ICD-10-CM | POA: Diagnosis present

## 2022-11-08 DIAGNOSIS — Z7901 Long term (current) use of anticoagulants: Secondary | ICD-10-CM | POA: Diagnosis not present

## 2022-11-08 LAB — PROCALCITONIN: Procalcitonin: <0.1 ng/mL

## 2022-11-08 LAB — COMPREHENSIVE METABOLIC PANEL
ALT: 16 U/L (ref 0–44)
AST: 28 U/L (ref 15–41)
Albumin: 4.1 g/dL (ref 3.5–5.0)
Alkaline Phosphatase: 112 U/L (ref 38–126)
Anion gap: 13 (ref 5–15)
BUN: 24 mg/dL — ABNORMAL HIGH (ref 8–23)
CO2: 37 mmol/L — ABNORMAL HIGH (ref 22–32)
Calcium: 9.8 mg/dL (ref 8.9–10.3)
Chloride: 94 mmol/L — ABNORMAL LOW (ref 98–111)
Creatinine, Ser: 0.81 mg/dL (ref 0.44–1.00)
GFR, Estimated: >60 mL/min (ref >60)
Glucose, Bld: 122 mg/dL — ABNORMAL HIGH (ref 70–99)
Potassium: 2.9 mmol/L — ABNORMAL LOW (ref 3.5–5.1)
Sodium: 144 mmol/L (ref 135–145)
Total Bilirubin: 1 mg/dL (ref 0.3–1.2)
Total Protein: 8.1 g/dL (ref 6.5–8.1)

## 2022-11-08 LAB — CBC WITH DIFFERENTIAL/PLATELET
Abs Immature Granulocytes: 0.04 10*3/uL (ref 0.00–0.07)
Basophils Absolute: 0.1 10*3/uL (ref 0.0–0.1)
Basophils Relative: 0 %
Eosinophils Absolute: 0.1 10*3/uL (ref 0.0–0.5)
Eosinophils Relative: 1 %
HCT: 42.9 % (ref 36.0–46.0)
Hemoglobin: 13 g/dL (ref 12.0–15.0)
Immature Granulocytes: 0 %
Lymphocytes Relative: 15 %
Lymphs Abs: 1.9 10*3/uL (ref 0.7–4.0)
MCH: 26.3 pg (ref 26.0–34.0)
MCHC: 30.3 g/dL (ref 30.0–36.0)
MCV: 86.7 fL (ref 80.0–100.0)
Monocytes Absolute: 0.8 10*3/uL (ref 0.1–1.0)
Monocytes Relative: 7 %
Neutro Abs: 9.4 10*3/uL — ABNORMAL HIGH (ref 1.7–7.7)
Neutrophils Relative %: 77 %
Platelets: 494 10*3/uL — ABNORMAL HIGH (ref 150–400)
RBC: 4.95 MIL/uL (ref 3.87–5.11)
RDW: 15.1 % (ref 11.5–15.5)
WBC: 12.2 10*3/uL — ABNORMAL HIGH (ref 4.0–10.5)
nRBC: 0 % (ref 0.0–0.2)

## 2022-11-08 LAB — URINALYSIS, COMPLETE (UACMP) WITH MICROSCOPIC
Bacteria, UA: NONE SEEN
Bilirubin Urine: NEGATIVE
Glucose, UA: NEGATIVE mg/dL
Ketones, ur: 5 mg/dL — AB
Nitrite: NEGATIVE
Protein, ur: 30 mg/dL — AB
Specific Gravity, Urine: 1.021 (ref 1.005–1.030)
pH: 6 (ref 5.0–8.0)

## 2022-11-08 LAB — RESP PANEL BY RT-PCR (RSV, FLU A&B, COVID)  RVPGX2
Influenza A by PCR: NEGATIVE
Influenza B by PCR: NEGATIVE
Resp Syncytial Virus by PCR: NEGATIVE
SARS Coronavirus 2 by RT PCR: NEGATIVE

## 2022-11-08 LAB — RESPIRATORY PANEL BY PCR

## 2022-11-08 LAB — BLOOD GAS, VENOUS
Acid-Base Excess: 15.4 mmol/L — ABNORMAL HIGH (ref 0.0–2.0)
Bicarbonate: 41.7 mmol/L — ABNORMAL HIGH (ref 20.0–28.0)
O2 Saturation: 96.9 %
Patient temperature: 37
pCO2, Ven: 56 mmHg (ref 44–60)
pH, Ven: 7.48 — ABNORMAL HIGH (ref 7.25–7.43)
pO2, Ven: 85 mmHg — ABNORMAL HIGH (ref 32–45)

## 2022-11-08 LAB — PROTIME-INR
INR: 1 (ref 0.8–1.2)
Prothrombin Time: 13.3 seconds (ref 11.4–15.2)

## 2022-11-08 LAB — APTT: aPTT: 26 seconds (ref 24–36)

## 2022-11-08 LAB — LACTIC ACID, PLASMA
Lactic Acid, Venous: 1.7 mmol/L (ref 0.5–1.9)
Lactic Acid, Venous: 1.2 mmol/L (ref 0.5–1.9)

## 2022-11-08 LAB — CK: Total CK: 148 U/L (ref 38–234)

## 2022-11-08 LAB — TSH: TSH: 0.515 u[IU]/mL (ref 0.350–4.500)

## 2022-11-08 LAB — MAGNESIUM: Magnesium: 1.7 mg/dL (ref 1.7–2.4)

## 2022-11-08 LAB — TROPONIN I (HIGH SENSITIVITY)
Troponin I (High Sensitivity): 29 ng/L — ABNORMAL HIGH (ref <18)
Troponin I (High Sensitivity): 27 ng/L — ABNORMAL HIGH (ref <18)

## 2022-11-08 MED ORDER — METHYLPREDNISOLONE SODIUM SUCC 125 MG IJ SOLR
125.0000 mg | Freq: Once | INTRAMUSCULAR | Status: AC
  Administered 2022-11-08: 125 mg via INTRAVENOUS
  Filled 2022-11-08: qty 2

## 2022-11-08 MED ORDER — HALOPERIDOL LACTATE 5 MG/ML IJ SOLN
5.0000 mg | Freq: Once | INTRAMUSCULAR | Status: AC
  Administered 2022-11-08: 5 mg via INTRAVENOUS

## 2022-11-08 MED ORDER — METOPROLOL TARTRATE 25 MG PO TABS
50.0000 mg | ORAL_TABLET | Freq: Two times a day (BID) | ORAL | Status: DC
  Administered 2022-11-08 – 2022-11-10 (×4): 50 mg via ORAL
  Filled 2022-11-08: qty 1
  Filled 2022-11-08: qty 2
  Filled 2022-11-08: qty 1
  Filled 2022-11-08: qty 2

## 2022-11-08 MED ORDER — POTASSIUM CHLORIDE 10 MEQ/100ML IV SOLN
10.0000 meq | Freq: Once | INTRAVENOUS | Status: AC
  Administered 2022-11-08: 10 meq via INTRAVENOUS
  Filled 2022-11-08: qty 100

## 2022-11-08 MED ORDER — ONDANSETRON HCL 4 MG/2ML IJ SOLN: 4.0000 mg | Freq: Four times a day (QID) | INTRAMUSCULAR | Status: DC | PRN

## 2022-11-08 MED ORDER — SODIUM CHLORIDE 0.9 % IV SOLN
1.0000 g | Freq: Once | INTRAVENOUS | Status: DC
  Filled 2022-11-08: qty 10

## 2022-11-08 MED ORDER — SODIUM CHLORIDE 0.9% FLUSH
3.0000 mL | Freq: Two times a day (BID) | INTRAVENOUS | Status: DC
  Administered 2022-11-08: 3 mL via INTRAVENOUS

## 2022-11-08 MED ORDER — LACTATED RINGERS IV BOLUS (SEPSIS)
1000.0000 mL | Freq: Once | INTRAVENOUS | Status: AC
  Administered 2022-11-08: 1000 mL via INTRAVENOUS

## 2022-11-08 MED ORDER — DOXYCYCLINE HYCLATE 100 MG PO TABS
100.0000 mg | ORAL_TABLET | Freq: Once | ORAL | Status: AC
  Administered 2022-11-08: 100 mg via ORAL
  Filled 2022-11-08: qty 1

## 2022-11-08 MED ORDER — AMLODIPINE BESYLATE 5 MG PO TABS
5.0000 mg | ORAL_TABLET | Freq: Every day | ORAL | Status: DC
  Administered 2022-11-08 – 2022-11-10 (×3): 5 mg via ORAL
  Filled 2022-11-08 (×3): qty 1

## 2022-11-08 MED ORDER — HALOPERIDOL LACTATE 5 MG/ML IJ SOLN
INTRAMUSCULAR | Status: AC
  Filled 2022-11-08: qty 1

## 2022-11-08 MED ORDER — PREDNISONE 20 MG PO TABS
40.0000 mg | ORAL_TABLET | Freq: Every day | ORAL | Status: DC
  Administered 2022-11-09 – 2022-11-10 (×2): 40 mg via ORAL
  Filled 2022-11-08 (×2): qty 2

## 2022-11-08 MED ORDER — LEVOTHYROXINE SODIUM 50 MCG PO TABS
50.0000 ug | ORAL_TABLET | Freq: Every day | ORAL | Status: DC
  Administered 2022-11-10: 50 ug via ORAL
  Filled 2022-11-08: qty 1

## 2022-11-08 MED ORDER — IPRATROPIUM-ALBUTEROL 0.5-2.5 (3) MG/3ML IN SOLN
9.0000 mL | Freq: Once | RESPIRATORY_TRACT | Status: AC
  Administered 2022-11-08: 9 mL via RESPIRATORY_TRACT
  Filled 2022-11-08: qty 3

## 2022-11-08 MED ORDER — IPRATROPIUM-ALBUTEROL 0.5-2.5 (3) MG/3ML IN SOLN
3.0000 mL | Freq: Four times a day (QID) | RESPIRATORY_TRACT | Status: DC
  Administered 2022-11-08 – 2022-11-10 (×6): 3 mL via RESPIRATORY_TRACT
  Filled 2022-11-08 (×6): qty 3

## 2022-11-08 MED ORDER — ACETAMINOPHEN 650 MG RE SUPP: 650.0000 mg | Freq: Four times a day (QID) | RECTAL | Status: DC | PRN

## 2022-11-08 MED ORDER — ADULT MULTIVITAMIN W/MINERALS CH
1.0000 | ORAL_TABLET | Freq: Every day | ORAL | Status: DC
  Administered 2022-11-08 – 2022-11-10 (×3): 1 via ORAL
  Filled 2022-11-08 (×3): qty 1

## 2022-11-08 MED ORDER — SODIUM CHLORIDE 0.9 % IV BOLUS
1000.0000 mL | Freq: Once | INTRAVENOUS | Status: AC
  Administered 2022-11-08: 1000 mL via INTRAVENOUS

## 2022-11-08 MED ORDER — ONDANSETRON HCL 4 MG PO TABS: 4.0000 mg | ORAL_TABLET | Freq: Four times a day (QID) | ORAL | Status: DC | PRN

## 2022-11-08 MED ORDER — POLYETHYLENE GLYCOL 3350 17 G PO PACK: 17.0000 g | PACK | Freq: Every day | ORAL | Status: DC | PRN

## 2022-11-08 MED ORDER — ACETAMINOPHEN 325 MG PO TABS
650.0000 mg | ORAL_TABLET | Freq: Four times a day (QID) | ORAL | Status: DC | PRN
  Administered 2022-11-10: 650 mg via ORAL
  Filled 2022-11-08: qty 2

## 2022-11-08 MED ORDER — THIAMINE MONONITRATE 100 MG PO TABS
100.0000 mg | ORAL_TABLET | Freq: Every day | ORAL | Status: DC
  Administered 2022-11-08 – 2022-11-10 (×3): 100 mg via ORAL
  Filled 2022-11-08 (×3): qty 1

## 2022-11-08 MED ORDER — POTASSIUM CHLORIDE CRYS ER 20 MEQ PO TBCR
40.0000 meq | EXTENDED_RELEASE_TABLET | Freq: Once | ORAL | Status: AC
  Administered 2022-11-08: 40 meq via ORAL
  Filled 2022-11-08: qty 2

## 2022-11-08 MED ORDER — ENOXAPARIN SODIUM 40 MG/0.4ML IJ SOSY
40.0000 mg | PREFILLED_SYRINGE | Freq: Every day | INTRAMUSCULAR | Status: DC
  Administered 2022-11-09: 40 mg via SUBCUTANEOUS
  Filled 2022-11-08: qty 0.4

## 2022-11-08 MED ORDER — ALPRAZOLAM 0.5 MG PO TABS
0.5000 mg | ORAL_TABLET | Freq: Three times a day (TID) | ORAL | Status: DC | PRN
  Administered 2022-11-08 – 2022-11-10 (×4): 0.5 mg via ORAL
  Filled 2022-11-08 (×4): qty 1

## 2022-11-08 MED ORDER — FOLIC ACID 1 MG PO TABS
1.0000 mg | ORAL_TABLET | Freq: Every day | ORAL | Status: DC
  Administered 2022-11-08 – 2022-11-10 (×3): 1 mg via ORAL
  Filled 2022-11-08 (×3): qty 1

## 2022-11-08 MED ORDER — LORAZEPAM 2 MG/ML IJ SOLN
1.0000 mg | Freq: Once | INTRAMUSCULAR | Status: AC
  Administered 2022-11-08: 1 mg via INTRAVENOUS
  Filled 2022-11-08: qty 1

## 2022-11-08 NOTE — ED Notes (Signed)
In and out cath unsuccessful times two, second rn at bedside to attempt, attempt unsuccessful, pt then began to void, sample collected. Md notified, sample sent to lab, pt tolerated well

## 2022-11-08 NOTE — ED Notes (Signed)
Blood culture number two drawn via 21 gauge butterfly from L arm, pt placed to bathroom, pt missed the hat in the toilet, unable to collect sample. Pt placed on pure wick, pt confused, pt doesn't know day of the week, re oriented pt. Pt moving all extremities.

## 2022-11-08 NOTE — Assessment & Plan Note (Signed)
Resolved. Per chart review, patient has a history of tachycardia.  On EKG today, consistent with sinus rhythm versus multifactorial atrial tachycardia (likely due to underlying COPD.  Given chronicity, no indication for telemetry monitoring.  Will restart prior metoprolol that she was on for this reason. - Restart home metoprolol

## 2022-11-08 NOTE — ED Notes (Signed)
Pt in bed, pt is hard of hearing, pt talking in full sentences, pt is confused.

## 2022-11-08 NOTE — Assessment & Plan Note (Addendum)
Wheezing has been improved.  Patient uses 2 L at baseline but per grandson she is mostly noncompliant. Patient presenting with shortness of breath and pleuritic chest pain with diffuse wheezing on examination and evidence of tachypnea with accessory muscle use.  Likely viral in nature with negative procalcitonin.  Given lack of productive cough, reassuring chest x-ray and negative procalcitonin, will hold off on further antibiotic use. -Continue supplemental oxygen to maintain oxygen saturation above 90% - prednisone 40 mg for 5 days - DuoNebs every 6 hours - Restart home bronchodilators

## 2022-11-08 NOTE — H&P (Signed)
History and Physical    Patient: [name] ZYS:[id number] DOB: Dec 09, 1945 DOA: 11/08/2022 DOS: the patient was seen and examined on 11/08/2022 PCP: Albina Billet, MD  Patient coming from: Home  Chief Complaint:  Chief Complaint  Patient presents with   Altered Mental Status    Possible UTI   HPI: [name] is a 76 y.o. female with medical history significant of CAD, COPD, atrial fibrillation, depression, hypertension, hyperlipidemia, hypothyroidism, who presents to the ED due to altered mental status.  History obtained from chart review and from patient.  [name] states she is unsure why she is here in the hospital.  She believes it may be a family member that led to her being brought here.  She endorses shortness of breath and chest pain with deep breaths, but denies any cough, nausea, vomiting, diarrhea, abdominal pain or generalized weakness at this time.  She is unsure if she has been experiencing shortness of breath at home.  Per chart review, patient's home health nurse came to check on her today and found her on the couch covered in blankets and confused.  At baseline, patient is alert and oriented x 3 and at the time patient was disoriented.  ED course: On arrival to the ED, patient was hypertensive at 176/91 with heart rate of 108.  She was saturating at 99% on 2 L with a respiratory rate of 34.  Initial VBG with pH of 7.48 and pCO2 of 56.  Initial blood work remarkable for potassium of 2.9, bicarb of 37, BUN of 24, initial troponin of 29 with flat trend to 27, elevated WBC at 12.2, platelets of 494.  Urinalysis with hematuria, ketonuria and small leukocytes, however no bacteria.  Chest x-ray with no opacities.  Due to concern for COPD, patient was started on prednisone and Solu-Medrol.  TRH contacted for admission for altered mental status.  Review of Systems: As mentioned in the history of present illness. All other systems reviewed and are negative.  Past Medical  History:  Diagnosis Date   Anxiety    CAD (coronary artery disease)    Carpal tunnel syndrome    Cataract    Complication of anesthesia    has woken up at times   COPD (chronic obstructive pulmonary disease) (HCC)    CRP elevated 09/14/2015   Depression    Difficult intubation    Dyslipidemia    Dyspnea    DOE   Dysrhythmia    hx palpatations   Elevated sedimentation rate 09/14/2015   Esophageal spasm    Gastrointestinal parasites    GERD (gastroesophageal reflux disease)    HCAP (healthcare-associated pneumonia) 10/22/2018   Hiatal hernia    History of peptic ulcer disease    Hypertension    Hyperthyroidism    Hypothyroidism    Low magnesium levels 09/14/2015   Pelvic fracture ([place]) 2008   fall from riding a horse   Peripheral vascular disease ([place])    Reflux    Rotator cuff injury    Sepsis ([place]) 10/22/2018   Stenosis, spinal, lumbar    Past Surgical History:  Procedure Laterality Date   AMPUTATION TOE Right 12/26/2020   Procedure: AMPUTATION TOE-3rd Toes;  Surgeon: Samara Deist, DPM;  Location: [facility];  Service: Podiatry;  Laterality: Right;   AMPUTATION TOE Right 04/22/2021   Procedure: AMPUTATION TOE- RIGHT 2ND;  Surgeon: Samara Deist, DPM;  Location: [facility];  Service: Podiatry;  Laterality: Right;   AMPUTATION TOE Right 10/21/2021  Procedure: AMPUTATION TOE;  Surgeon: Caroline More, DPM;  Location: [facility];  Service: Podiatry;  Laterality: Right;   AMPUTATION TOE Right 11/23/2021   Procedure: AMPUTATION TOE;  Surgeon: Sharlotte Alamo, DPM;  Location: [facility];  Service: Podiatry;  Laterality: Right;   AMPUTATION TOE Right 07/05/2022   Procedure: AMPUTATION RIGHT 5TH TOE;  Surgeon: [name], DPM;  Location: [facility];  Service: Podiatry;  Laterality: Right;   APPENDECTOMY     BACK SURGERY     CERVICAL FUSION   CARPAL TUNNEL RELEASE     CATARACT EXTRACTION W/PHACO Right 08/07/2017   Procedure: CATARACT EXTRACTION PHACO AND INTRAOCULAR LENS PLACEMENT ([place]);   Surgeon: Birder Robson, MD;  Location: [facility];  Service: Ophthalmology;  Laterality: Right;  [place] 00:52.0 AP% 16.8 CDE 8.74 Fluid Pack Lot # [id number]   CATARACT EXTRACTION W/PHACO Left 09/04/2017   Procedure: CATARACT EXTRACTION PHACO AND INTRAOCULAR LENS PLACEMENT ([place]);  Surgeon: Birder Robson, MD;  Location: [facility];  Service: Ophthalmology;  Laterality: Left;  [place] 00:34 AP% 17.0 CDE 5.80 Fluid pack lot # [id number]   CESAREAN SECTION     CHOLECYSTECTOMY     FRACTURE SURGERY     HIP SURGERY     INTRAMEDULLARY (IM) NAIL INTERTROCHANTERIC Right 10/01/2016   Procedure: INTRAMEDULLARY (IM) NAIL INTERTROCHANTRIC;  Surgeon: Corky Mull, MD;  Location: [facility];  Service: Orthopedics;  Laterality: Right;   INTRAMEDULLARY (IM) NAIL INTERTROCHANTERIC Left 01/14/2021   Procedure: INTRAMEDULLARY (IM) NAIL INTERTROCHANTRIC;  Surgeon: Lovell Sheehan, MD;  Location: [facility];  Service: Orthopedics;  Laterality: Left;   KYPHOPLASTY N/A 08/20/2018   Procedure: [id number];  Surgeon: Hessie Knows, MD;  Location: [facility];  Service: Orthopedics;  Laterality: N/A;   LOWER EXTREMITY ANGIOGRAPHY Right 12/18/2019   Procedure: LOWER EXTREMITY ANGIOGRAPHY;  Surgeon: Algernon Huxley, MD;  Location: [place] [facility];  Service: Cardiovascular;  Laterality: Right;   LOWER EXTREMITY ANGIOGRAPHY Right 12/02/2020   Procedure: LOWER EXTREMITY ANGIOGRAPHY;  Surgeon: Algernon Huxley, MD;  Location: [place] [facility];  Service: Cardiovascular;  Laterality: Right;   LOWER EXTREMITY ANGIOGRAPHY Right 12/23/2020   Procedure: Lower Extremity Angiography;  Surgeon: Algernon Huxley, MD;  Location: [place] [facility];  Service: Cardiovascular;  Laterality: Right;   LOWER EXTREMITY ANGIOGRAPHY Right 12/24/2020   Procedure: Lower Extremity Angiography;  Surgeon: Algernon Huxley, MD;  Location: [place] [facility];  Service: Cardiovascular;  Laterality: Right;   LOWER EXTREMITY ANGIOGRAPHY Right 10/20/2021   Procedure: Lower  Extremity Angiography;  Surgeon: Algernon Huxley, MD;  Location: [place] [facility];  Service: Cardiovascular;  Laterality: Right;   LOWER EXTREMITY ANGIOGRAPHY Right 07/03/2022   Procedure: Lower Extremity Angiography;  Surgeon: Algernon Huxley, MD;  Location: [place] [facility];  Service: Cardiovascular;  Laterality: Right;   NECK SURGERY     NOSE SURGERY     ROTATOR CUFF REPAIR     x2   SHOULDER ARTHROSCOPY  12/07/2011   Procedure: ARTHROSCOPY SHOULDER;  Surgeon: Ninetta Lights, MD;  Location: [place];  Service: Orthopedics;  Laterality: Right;  Debridement Partial Cuff Tear, Release Coracoacromial Ligament   SHOULDER SURGERY  12/07/2011   right   Social History:  reports that she has been smoking cigarettes. She has never used smokeless tobacco. She reports that she does not drink alcohol and does not use drugs.  No Known Allergies  Family History  Problem Relation Age of Onset   Aneurysm Other  Prior to Admission medications   Medication Sig Start Date End Date Taking? Authorizing Provider  ALPRAZolam Duanne Moron) 1 MG tablet Take 1 mg by mouth 3 (three) times daily as needed. 10/06/21  Yes [provider]  levothyroxine (SYNTHROID) 50 MCG tablet Take 50 mcg by mouth every morning. 08/09/22  Yes [provider]  traZODone (DESYREL) 150 MG tablet Take 300 mg by mouth at bedtime as needed. 09/22/21  Yes [provider]  albuterol (VENTOLIN HFA) 108 (90 Base) MCG/ACT inhaler Inhale 1-2 puffs into the lungs every 4 (four) hours as needed for shortness of breath or wheezing. 06/28/22   [provider]  amLODipine (NORVASC) 5 MG tablet Take 5 mg by mouth daily. Patient not taking: Reported on 06/29/2022 08/31/21   [provider]  ammonium lactate (AMLACTIN) 12 % lotion Apply 1 Application topically as needed for dry skin. 10/19/22   Felipa Furnace, DPM  apixaban (ELIQUIS) 5 MG TABS tablet Take 1 tablet (5 mg total) by mouth 2 (two)  times daily. Patient not taking: Reported on 11/08/2022 07/08/22   Heath Lark D, DO  atorvastatin (LIPITOR) 10 MG tablet Take 1 tablet (10 mg total) by mouth daily. Patient not taking: Reported on 06/29/2022 12/18/19 11/20/21  Algernon Huxley, MD  COMBIVENT RESPIMAT 20-100 MCG/ACT AERS respimat Inhale 1-2 puffs into the lungs 4 (four) times daily. 11/25/21   Loletha Grayer, MD  Fluticasone-Salmeterol (ADVAIR) 250-50 MCG/DOSE AEPB Inhale 1 puff into the lungs 2 (two) times daily. Patient not taking: Reported on 06/29/2022 11/25/21 12/25/21  Loletha Grayer, MD  HYDROcodone-acetaminophen [facility]) 10-325 MG tablet Take 1 tablet by mouth every 4 (four) hours as needed for moderate pain or severe pain. Patient not taking: Reported on 11/08/2022 07/08/22   Heath Lark D, DO  levothyroxine (SYNTHROID, LEVOTHROID) 88 MCG tablet Take 88 mcg by mouth daily before breakfast.  Patient not taking: Reported on 06/29/2022    [provider]  metoprolol tartrate (LOPRESSOR) 50 MG tablet Take 1 tablet (50 mg total) by mouth 2 (two) times daily. 07/08/22 08/07/22  Manuella Ghazi, Pratik D, DO  montelukast (SINGULAIR) 10 MG tablet Take 10 mg by mouth daily. Patient not taking: Reported on 06/29/2022    [provider]  nutrition supplement, JUVEN, (JUVEN) PACK Take 1 packet by mouth 2 (two) times daily between meals. Patient not taking: Reported on 06/29/2022 11/25/21   Loletha Grayer, MD  oxyCODONE-acetaminophen (PERCOCET) 5-325 MG tablet Take 1 tablet by mouth every 4 (four) hours as needed for severe pain. Patient not taking: Reported on 11/08/2022 07/20/22   Felipa Furnace, DPM  polyethylene glycol (MIRALAX / GLYCOLAX) 17 g packet Take 17 g by mouth daily. Patient not taking: Reported on 06/29/2022 11/25/21   Loletha Grayer, MD  senna-docusate (SENOKOT-S) 8.6-50 MG tablet Take 2 tablets by mouth at bedtime. Patient not taking: Reported on 06/29/2022 11/25/21   Loletha Grayer, MD  Vitamin D, Ergocalciferol, (DRISDOL)  1.25 MG (50000 UNIT) CAPS capsule Take 1 capsule (50,000 Units total) by mouth every 7 (seven) days. Patient not taking: Reported on 06/29/2022 12/01/21   Loletha Grayer, MD   Physical Exam: Vitals:   11/08/22 1230 11/08/22 1300 11/08/22 1406 11/08/22 1537  BP: (!) 179/85 (!) 180/102 (!) 176/103   Pulse: (!) 113 (!) 108 (!) 109   Resp: (!) 35 (!) 35 (!) 30   Temp:   98.8 F (37.1 C)   TempSrc:   Oral   SpO2: 98% 99% 98%   Weight:  45.4 kg  Height:    '5\' 4"'$  (1.626 m)   Physical Exam Vitals and nursing note reviewed.  Constitutional:      General: She is not in acute distress.    Appearance: She is cachectic.  HENT:     Head: Normocephalic and atraumatic.     Mouth/Throat:     Mouth: Mucous membranes are dry.     Pharynx: Oropharynx is clear.  Eyes:     Conjunctiva/sclera: Conjunctivae normal.     Pupils: Pupils are equal, round, and reactive to light.  Cardiovascular:     Rate and Rhythm: Regular rhythm. Tachycardia present.     Heart sounds: No murmur heard.    No gallop.  Pulmonary:     Effort: Tachypnea and accessory muscle usage present. No respiratory distress.     Breath sounds: Wheezing (Diffuse expiratory wheezing heard throughout.) present. No decreased breath sounds, rhonchi or rales.  Abdominal:     General: Bowel sounds are normal. There is no distension.     Palpations: Abdomen is soft.     Tenderness: There is no abdominal tenderness. There is no guarding.  Musculoskeletal:     Cervical back: Neck supple.     Right lower leg: No edema.     Left lower leg: No edema.  Skin:    General: Skin is warm and dry.  Neurological:     Mental Status: She is alert.     Comments:  Patient is alert and oriented to self and place.  She is disoriented to time and situation. 5 out of 5 strength throughout No tremor Sensation intact throughout  Psychiatric:        Mood and Affect: Mood normal.        Behavior: Behavior normal. Behavior is cooperative.         Cognition and Memory: Cognition is impaired. Memory is impaired.    Data Reviewed: CBC with WBC of 12.2, hemoglobin of 13, platelets of 494. CMP with potassium of 2.9, chloride of 94, bicarb of 37, glucose of 122, BUN of 24, creatinine 0.81 PT/INR PTT within normal limits. Lactic acid within normal limits VBG with pH of 7.48 pCO2 of 56. Initial troponin elevated at 29 with downtrend to 27. CK within normal limits Magnesium within normal limits Urinalysis with normal specific gravity, small hemoglobin, ketonuria, proteinuria, and small leukocytes but no bacteria seen.  EKG obtained at 11:49 AM personally reviewed.  Sinus rhythm with rate of 110.  No acute ST or T wave changes concerning for ischemia.  CT Head Wo Contrast  Result Date: 11/08/2022 CLINICAL DATA:  Altered mental status EXAM: CT HEAD WITHOUT CONTRAST TECHNIQUE: Contiguous axial images were obtained from the base of the skull through the vertex without intravenous contrast. RADIATION DOSE REDUCTION: This exam was performed according to the departmental dose-optimization program which includes automated exposure control, adjustment of the mA and/or kV according to patient size and/or use of iterative reconstruction technique. COMPARISON:  CT head 06/29/22 FINDINGS: Brain: No evidence of acute infarction, hemorrhage, hydrocephalus, extra-axial collection or mass lesion/mass effect. Sequela of moderate chronic microvascular ischemic change.Generalized volume loss. Vascular: No hyperdense vessel or unexpected calcification. Skull: Normal. Negative for fracture or focal lesion. Sinuses/Orbits: Bilateral lens replacement Other: None. IMPRESSION: No acute intracranial abnormality Electronically Signed   By: Marin Roberts M.D.   On: 11/08/2022 14:03   DG Chest Port 1 View  Result Date: 11/08/2022 CLINICAL DATA:  Sepsis. EXAM: PORTABLE CHEST 1 VIEW COMPARISON:  June 29, 2022. FINDINGS: The heart size and mediastinal contours are within  normal limits. Both lungs are clear. The visualized skeletal structures are unremarkable. IMPRESSION: No active disease. Electronically Signed   By: Marijo Conception M.D.   On: 11/08/2022 10:41     Results are pending, will review when available.  Assessment and Plan: * AMS (altered mental status) Patient presenting with 1 day history of altered mental status with last known normal yesterday.  Etiology undetermined at this time.  Urinalysis with small leukocytes but no bacteria seen.  Although examination nonfocal, will obtain MRI brain to rule out CVA. Chart review mentions A. Fib, however no prior EKGs with A. Fib and EKG here with sinus tachycardia. Differential also includes underlying viral illness.  Patient's chronic benzodiazepine use may be contributing as well.  VBG without hypercapnia.  Potassium is low however mild.  No other electrolyte abnormalities to explain symptoms.  - MRI brain - TSH pending - Vitamin B12 pending - Respiratory viral panel - Replete potassium - Blood and urine cultures pending - Restart home benzos to avoid withdrawal, however reduced dose  COPD exacerbation ([place]) Patient presenting with shortness of breath and pleuritic chest pain with diffuse wheezing on examination and evidence of tachypnea with accessory muscle use.  Likely viral in nature with negative procalcitonin.  Given lack of productive cough, reassuring chest x-ray and negative procalcitonin, will hold off on further antibiotic use.  -Continue supplemental oxygen to maintain oxygen saturation above 88% - Given history of hypercapnia, avoid hyperoxygenation - Wean as tolerated - S/p Solu-Medrol 125 mg - Start prednisone 40 mg tomorrow to complete a 5-day course - DuoNebs every 6 hours - Restart home bronchodilators  Tachycardia Per chart review, patient has a history of tachycardia.  On EKG today, consistent with sinus rhythm versus multifactorial atrial tachycardia (likely due to underlying  COPD.  Given chronicity, no indication for telemetry monitoring.  Will restart prior metoprolol that she was on for this reason.  - Restart home metoprolol  Essential hypertension - Restart home antihypertensives tomorrow as long as MRI brain is negative for CVA  Underweight Likely due to severe COPD versus malnutrition.  -Dietitian consultation  Advance Care Planning:   Code Status: Full Code due to inability to make medical decisions at this time.  Consults: None  Family Communication: No family at bedside.  Severity of Illness: The appropriate patient status for this patient is OBSERVATION. Observation status is judged to be reasonable and necessary in order to provide the required intensity of service to ensure the patient's safety. The patient's presenting symptoms, physical exam findings, and initial radiographic and laboratory data in the context of their medical condition is felt to place them at decreased risk for further clinical deterioration. Furthermore, it is anticipated that the patient will be medically stable for discharge from the hospital within 2 midnights of admission.   Author: Jose Persia, MD 11/08/2022 5:03 PM  For on call review [email or web address].

## 2022-11-08 NOTE — ED Provider Notes (Signed)
Procedures     ----------------------------------------- 11:50 PM on 11/08/2022 ----------------------------------------- Pt has been admitted to hospitalist team.  I was called to bedside due to urgent intervention needed for acute agitation, with pt combative, punching and kicking at staff. Shouting, unable to de-escalate or redirect. She required manual hold for safety until medication (haldol '5mg'$  IV and ativan '1mg'$  IV) could be given to calm her.  I examined her and don't see any signs of new injury or acute decompensation of her condition otherwise.     Carrie Mew, MD 11/08/22 2351

## 2022-11-08 NOTE — Assessment & Plan Note (Signed)
Blood pressure mildly elevated. -Continue home amlodipine and metoprolol

## 2022-11-08 NOTE — ED Triage Notes (Signed)
Pt presents to ED via [facility] with /co of AMS due to possible UTI. Pt from home. Home health nurse called.

## 2022-11-08 NOTE — Assessment & Plan Note (Addendum)
Patient presenting with 1 day history of altered mental status with last known normal yesterday.  Etiology undetermined at this time.  Urinalysis with small leukocytes but no bacteria seen.  Although examination nonfocal, will obtain MRI brain to rule out CVA. Chart review mentions A. Fib, however no prior EKGs with A. Fib and EKG here with sinus tachycardia. Differential also includes underlying viral illness.  Patient's chronic benzodiazepine use may be contributing as well.  VBG without hypercapnia.  Potassium is low however mild.  No other electrolyte abnormalities to explain symptoms. Patient most likely having underlying undiagnosed dementia.  MRI brain was without any acute abnormality.  Urine cultures pending.  No urinary symptoms. TSH and B12 within normal limit.  1 out of 4 blood culture with gram-positive cocci but no organism identified, most likely contaminant.  Procalcitonin negative. - Restart home benzos to avoid withdrawal, however reduced dose -Start her on Keflex for concern of UTI

## 2022-11-08 NOTE — ED Provider Notes (Signed)
[facility] Provider Note    Event Date/Time   First MD Initiated Contact with Patient 11/08/22 1006     (approximate)   History   Altered Mental Status (Possible UTI)   HPI  [name] is a 76 y.o. female past medical history significant for COPD on baseline 2 L of oxygen, depression, CAD, hypertension, presents to the emergency department with altered mental status.  Patient has a home health nurse 3 times a week who came out today and found her laying on the couch covered in blankets and was confused.  Normally is alert and oriented however today did not recognize her home health nurse and stated that she did not know why she was here.  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  Patient believes that the year is 20 35.  Endorses cough and shortness of breath.  Denies any chest pain.  Physical Exam   Triage Vital Signs: ED Triage Vitals  Enc Vitals Group     BP 11/08/22 1014 (!) 152/82     Pulse Rate 11/08/22 1010 (!) 125     Resp 11/08/22 1014 (!) 26     Temp --      Temp src --      SpO2 11/08/22 1010 98 %     Weight --      Height --      Head Circumference --      Peak Flow --      Pain Score 11/08/22 1010 0     Pain Loc --      Pain Edu? --      Excl. in [place]? --     Most recent vital signs: Vitals:   11/08/22 1300 11/08/22 1406  BP: (!) 180/102 (!) 176/103  Pulse: (!) 108 (!) 109  Resp: (!) 35 (!) 30  Temp:  98.8 F (37.1 C)  SpO2: 99% 98%    Physical Exam Constitutional:      Appearance: She is well-developed. She is ill-appearing.  HENT:     Head: Atraumatic.  Eyes:     Conjunctiva/sclera: Conjunctivae normal.  Cardiovascular:     Rate and Rhythm: Tachycardia present. Rhythm irregular.  Pulmonary:     Effort: Respiratory distress present.     Breath sounds: Wheezing and rales present.     Comments: Tachypneic, 2 L nasal cannula Abdominal:     General: There is no distension.  Musculoskeletal:         General: Normal range of motion.     Cervical back: Normal range of motion.     Right lower leg: No edema.     Left lower leg: No edema.  Skin:    General: Skin is warm.     Capillary Refill: Capillary refill takes less than 2 seconds.  Neurological:     Mental Status: She is alert. She is disoriented.     IMPRESSION / MDM / ASSESSMENT AND PLAN / ED COURSE  I reviewed the triage vital signs and the nursing notes.  Differential diagnosis including infectious process, intracranial hemorrhage, electrolyte abnormality, dehydration, infectious process, COPD exacerbation, hypercarbia  EKG  I, Nathaniel Man, the attending physician, personally viewed and interpreted this ECG.   Rate: Tachycardia, 120s  Rhythm: Sinus tachycardia  Axis: Normal  Intervals: Normal  ST&T Change: None  Sinus tachycardia while on cardiac telemetry.  On chart review stated that she had a history of atrial fibrillation however other areas  stated that she did not have a history of atrial fibrillation.  EKG concerning for possible MAT.  RADIOLOGY I independently reviewed imaging, my interpretation of imaging: Chest x-ray without focal findings consistent with pneumonia.  Read as no acute findings.  LABS (all labs ordered are listed, but only abnormal results are displayed) Labs interpreted as -  Mildly elevated troponin that is downtrending.  Normal lactic acid.  Hypokalemia 2.9.  No signs of hypercarbia.  Labs Reviewed  COMPREHENSIVE METABOLIC PANEL - Abnormal; Notable for the following components:      Result Value   Potassium 2.9 (*)    Chloride 94 (*)    CO2 37 (*)    Glucose, Bld 122 (*)    BUN 24 (*)    All other components within normal limits  CBC WITH DIFFERENTIAL/PLATELET - Abnormal; Notable for the following components:   WBC 12.2 (*)    Platelets 494 (*)    Neutro Abs 9.4 (*)    All other components within normal limits  URINALYSIS, COMPLETE (UACMP) WITH MICROSCOPIC - Abnormal;  Notable for the following components:   Color, Urine YELLOW (*)    APPearance HAZY (*)    Hgb urine dipstick SMALL (*)    Ketones, ur 5 (*)    Protein, ur 30 (*)    Leukocytes,Ua SMALL (*)    All other components within normal limits  BLOOD GAS, VENOUS - Abnormal; Notable for the following components:   pH, Ven 7.48 (*)    pO2, Ven 85 (*)    Bicarbonate 41.7 (*)    Acid-Base Excess 15.4 (*)    All other components within normal limits  TROPONIN I (HIGH SENSITIVITY) - Abnormal; Notable for the following components:   Troponin I (High Sensitivity) 29 (*)    All other components within normal limits  TROPONIN I (HIGH SENSITIVITY) - Abnormal; Notable for the following components:   Troponin I (High Sensitivity) 27 (*)    All other components within normal limits  RESP PANEL BY RT-PCR (RSV, FLU A&B, COVID)  RVPGX2  CULTURE, BLOOD (ROUTINE X 2)  CULTURE, BLOOD (ROUTINE X 2)  URINE CULTURE  RESPIRATORY PANEL BY PCR  LACTIC ACID, PLASMA  LACTIC ACID, PLASMA  PROTIME-INR  APTT  CK  MAGNESIUM  TSH  PROCALCITONIN  VITAMIN B12    TREATMENT   IV fluids, DuoNebs, IV Solu-Medrol, doxycycline  Concern for altered mental status, possibly secondary to COPD exacerbation and hypokalemia.  Given potassium replacement.   PROCEDURES:  Critical Care performed: No  Procedures  Patient's presentation is most consistent with acute presentation with potential threat to life or bodily function.   MEDICATIONS ORDERED IN ED: Medications  enoxaparin (LOVENOX) injection 40 mg (has no administration in time range)  sodium chloride flush (NS) 0.9 % injection 3 mL (3 mLs Intravenous Given 11/08/22 1551)  acetaminophen (TYLENOL) tablet 650 mg (has no administration in time range)    Or  acetaminophen (TYLENOL) suppository 650 mg (has no administration in time range)  polyethylene glycol (MIRALAX / GLYCOLAX) packet 17 g (has no administration in time range)  ondansetron (ZOFRAN) tablet 4 mg  (has no administration in time range)    Or  ondansetron (ZOFRAN) injection 4 mg (has no administration in time range)  predniSONE (DELTASONE) tablet 40 mg (has no administration in time range)  multivitamin with minerals tablet 1 tablet (1 tablet Oral Given 16/10/96 0454)  folic acid (FOLVITE) tablet 1 mg (1 mg Oral Given 11/08/22 1548)  thiamine (VITAMIN  B1) tablet 100 mg (100 mg Oral Given 11/08/22 1548)  ipratropium-albuterol (DUONEB) 0.5-2.5 (3) MG/3ML nebulizer solution 3 mL (has no administration in time range)  lactated ringers bolus 1,000 mL (0 mLs Intravenous Stopped 11/08/22 1211)  potassium chloride SA (KLOR-CON M) CR tablet 40 mEq (40 mEq Oral Given 11/08/22 1217)  potassium chloride 10 mEq in 100 mL IVPB (0 mEq Intravenous Stopped 11/08/22 1340)  sodium chloride 0.9 % bolus 1,000 mL (0 mLs Intravenous Stopped 11/08/22 1340)  ipratropium-albuterol (DUONEB) 0.5-2.5 (3) MG/3ML nebulizer solution 9 mL (9 mLs Nebulization Given 11/08/22 1411)  methylPREDNISolone sodium succinate (SOLU-MEDROL) 125 mg/2 mL injection 125 mg (125 mg Intravenous Given 11/08/22 1409)  doxycycline (VIBRA-TABS) tablet 100 mg (100 mg Oral Given 11/08/22 1408)    FINAL CLINICAL IMPRESSION(S) / ED DIAGNOSES   Final diagnoses:  Altered mental status, unspecified altered mental status type     Rx / DC Orders   ED Discharge Orders     None        Note:  This document was prepared using Dragon voice recognition software and may include unintentional dictation errors.   Nathaniel Man, MD 11/08/22 1623

## 2022-11-08 NOTE — Assessment & Plan Note (Signed)
Likely due to severe COPD versus malnutrition.  -Dietitian consultation

## 2022-11-09 ENCOUNTER — Encounter: Payer: Self-pay | Admitting: Internal Medicine

## 2022-11-09 ENCOUNTER — Observation Stay: Payer: Medicare HMO

## 2022-11-09 DIAGNOSIS — I10 Essential (primary) hypertension: Secondary | ICD-10-CM | POA: Diagnosis not present

## 2022-11-09 DIAGNOSIS — R4182 Altered mental status, unspecified: Secondary | ICD-10-CM | POA: Diagnosis not present

## 2022-11-09 DIAGNOSIS — J441 Chronic obstructive pulmonary disease with (acute) exacerbation: Secondary | ICD-10-CM | POA: Diagnosis not present

## 2022-11-09 DIAGNOSIS — R410 Disorientation, unspecified: Secondary | ICD-10-CM | POA: Diagnosis not present

## 2022-11-09 LAB — BASIC METABOLIC PANEL
Anion gap: 8 (ref 5–15)
BUN: 16 mg/dL (ref 8–23)
CO2: 26 mmol/L (ref 22–32)
Calcium: 8.1 mg/dL — ABNORMAL LOW (ref 8.9–10.3)
Chloride: 112 mmol/L — ABNORMAL HIGH (ref 98–111)
Creatinine, Ser: 0.59 mg/dL (ref 0.44–1.00)
GFR, Estimated: >60 mL/min (ref >60)
Glucose, Bld: 98 mg/dL (ref 70–99)
Potassium: 4 mmol/L (ref 3.5–5.1)
Sodium: 146 mmol/L — ABNORMAL HIGH (ref 135–145)

## 2022-11-09 LAB — BLOOD CULTURE ID PANEL (REFLEXED) - BCID2

## 2022-11-09 LAB — VITAMIN B12: Vitamin B-12: 482 pg/mL (ref 180–914)

## 2022-11-09 MED ORDER — CEPHALEXIN 500 MG PO CAPS
500.0000 mg | ORAL_CAPSULE | Freq: Three times a day (TID) | ORAL | Status: DC
  Administered 2022-11-09 – 2022-11-10 (×4): 500 mg via ORAL
  Filled 2022-11-09 (×5): qty 1

## 2022-11-09 MED ORDER — LORAZEPAM 2 MG/ML IJ SOLN
0.5000 mg | Freq: Once | INTRAMUSCULAR | Status: AC
  Administered 2022-11-09: 0.5 mg via INTRAVENOUS
  Filled 2022-11-09: qty 1

## 2022-11-09 MED ORDER — PANTOPRAZOLE SODIUM 40 MG PO TBEC
40.0000 mg | DELAYED_RELEASE_TABLET | Freq: Every day | ORAL | Status: DC
  Administered 2022-11-09 – 2022-11-10 (×2): 40 mg via ORAL
  Filled 2022-11-09 (×2): qty 1

## 2022-11-09 MED ORDER — DEXTROSE 5 % IV SOLN: INTRAVENOUS | Status: AC

## 2022-11-09 NOTE — ED Notes (Signed)
Pt requesting med to help her calm down and sleep, prn given

## 2022-11-09 NOTE — Progress Notes (Signed)
PHARMACY - PHYSICIAN COMMUNICATION CRITICAL VALUE ALERT - BLOOD CULTURE IDENTIFICATION (BCID)  [name] is an 76 y.o. female who presented to [facility] on 11/08/2022 with a chief complaint of AMS   Assessment:  GPC or GPR growing in 1 of 4 bottles, lab could not specify which was growing, BCID did not recognize species  (include suspected source if known)  Name of physician (or Provider) Contacted: Sharion Settler, NP  Current antibiotics:   Changes to prescribed antibiotics recommended:  No , most likely a contaminant.   No results found for this or any previous visit.  Breeley Bischof D 11/09/2022  4:54 AM

## 2022-11-09 NOTE — ED Notes (Signed)
Report given to Rachael RN.

## 2022-11-09 NOTE — ED Notes (Signed)
Pt is complaining of CP, the provider has been notified at this time.

## 2022-11-09 NOTE — ED Notes (Signed)
Pt oriented to person only. Eating breakfast. Bed alarm in place. Call bell inreach

## 2022-11-09 NOTE — Hospital Course (Addendum)
Taken from H&P.  [name] is a 76 y.o. female with medical history significant of CAD, COPD, atrial fibrillation, depression, hypertension, hyperlipidemia, hypothyroidism, who presents to the ED due to altered mental status.  History obtained from chart review and from patient.   [name] states she is unsure why she is here in the hospital.  She believes it may be a family member that led to her being brought here.  She endorses shortness of breath and chest pain with deep breaths, but denies any cough, nausea, vomiting, diarrhea, abdominal pain or generalized weakness at this time.  She is unsure if she has been experiencing shortness of breath at home.   Per chart review, patient's home health nurse came to check on her today and found her on the couch covered in blankets and confused.  At baseline, patient is alert and oriented x 3 and at the time patient was disoriented.  ED course: On arrival to the ED, patient was hypertensive at 176/91 with heart rate of 108.  She was saturating at 99% on 2 L with a respiratory rate of 34.  Initial VBG with pH of 7.48 and pCO2 of 56.  Initial blood work remarkable for potassium of 2.9, bicarb of 37, BUN of 24, initial troponin of 29 with flat trend to 27, elevated WBC at 12.2, platelets of 494.  Urinalysis with hematuria, ketonuria and small leukocytes, however no bacteria.  Chest x-ray with no opacities. EKG with sinus rhythm, rate of 110, no acute ST or T wave changes concerning for ischemia. CT head with no acute intracranial abnormalities.  Due to concern for COPD, patient was started on prednisone and Solu-Medrol.  12/21: Vital seems stable.  Saturating 97% on 2 L.  COVID-19, influenza and RSV PCR was negative.  Respiratory viral panel negative.  1 out of 4 blood culture bottles with gram-positive cocci or rods with no organism identified on panel.  Most likely a contaminant.  Procalcitonin negative.  TSH within normal limit at 0.515, B12 at 482.   Urine cultures pending.  Hypokalemia has been resolved, potassium at 4.  Mild hypernatremia with sodium of 146-ordered D5 total of 1 L, encouraging p.o. hydration. MRI brain was without any acute abnormality. Had a long discussion with grandson, it appears that she is having some underlying dementia but there was no formal diagnosis so he was not sure what is going on.  Patient was oriented to self and partially oriented to place.  She was telling very irrelevant stories on talking.  Per grandson she was doing that at home and tried to jump from 1 topic to the other very frequently. She lives alone and 2 private caretakers take turns to take care of her, he is currently in the process of moving her to assisted living facility.  He was requesting 1 more night so he can make arrangements with her caregivers.  He himself is recovering from flu and will not be able to participate actively in her care.  12/22: Afebrile with stable vitals, patient refused morning labs as she does not want another stick.  Urine cultures still pending.  Patient wants to go home.  She is more alert and oriented and appears to be at her baseline today.  Patient was found to be not using most of her home medications she was advised to follow-up closely with primary care provider and take medications as advised.  She is being discharged on Keflex for total of 5 days.  Patient  was also taking high doses of alprazolam which can be contributory to her disorientation.  She will need a taper which should be done by her PCP.  Patient will continue on current medications and need to have a close follow-up with PCP.  She will need memory testing to make the final diagnosis of dementia and its severity which can be done at PCP office.

## 2022-11-09 NOTE — Progress Notes (Signed)
Progress Note   Patient: [name] PJA:[id number] DOB: May 30, 1946 DOA: 11/08/2022     0 DOS: the patient was seen and examined on 11/09/2022   Brief hospital course: Taken from H&P.  [name] is a 76 y.o. female with medical history significant of CAD, COPD, atrial fibrillation, depression, hypertension, hyperlipidemia, hypothyroidism, who presents to the ED due to altered mental status.  History obtained from chart review and from patient.   [name] states she is unsure why she is here in the hospital.  She believes it may be a family member that led to her being brought here.  She endorses shortness of breath and chest pain with deep breaths, but denies any cough, nausea, vomiting, diarrhea, abdominal pain or generalized weakness at this time.  She is unsure if she has been experiencing shortness of breath at home.   Per chart review, patient's home health nurse came to check on her today and found her on the couch covered in blankets and confused.  At baseline, patient is alert and oriented x 3 and at the time patient was disoriented.  ED course: On arrival to the ED, patient was hypertensive at 176/91 with heart rate of 108.  She was saturating at 99% on 2 L with a respiratory rate of 34.  Initial VBG with pH of 7.48 and pCO2 of 56.  Initial blood work remarkable for potassium of 2.9, bicarb of 37, BUN of 24, initial troponin of 29 with flat trend to 27, elevated WBC at 12.2, platelets of 494.  Urinalysis with hematuria, ketonuria and small leukocytes, however no bacteria.  Chest x-ray with no opacities. EKG with sinus rhythm, rate of 110, no acute ST or T wave changes concerning for ischemia. CT head with no acute intracranial abnormalities.  Due to concern for COPD, patient was started on prednisone and Solu-Medrol.  12/21: Vital seems stable.  Saturating 97% on 2 L.  COVID-19, influenza and RSV PCR was negative.  Respiratory viral panel negative.  1 out of 4 blood culture  bottles with gram-positive cocci or rods with no organism identified on panel.  Most likely a contaminant.  Procalcitonin negative.  TSH within normal limit at 0.515, B12 at 482.  Urine cultures pending.  Hypokalemia has been resolved, potassium at 4.  Mild hypernatremia with sodium of 146-ordered D5 total of 1 L, encouraging p.o. hydration. MRI brain was without any acute abnormality. Had a long discussion with grandson, it appears that she is having some underlying dementia but there was no formal diagnosis so he was not sure what is going on.  Patient was oriented to self and partially oriented to place.  She was telling very irrelevant stories on talking.  Per grandson she was doing that at home and tried to jump from 1 topic to the other very frequently. She lives alone and to private caretakers take turns to take care of her, he is currently in the process of moving her to assisted living facility.  He was requesting 1 more night so he can make arrangements with her caregivers.  He himself is recovering from flu and will not be able to participate actively in her care.  Assessment and Plan: * AMS (altered mental status) Patient presenting with 1 day history of altered mental status with last known normal yesterday.  Etiology undetermined at this time.  Urinalysis with small leukocytes but no bacteria seen.  Although examination nonfocal, will obtain MRI brain to rule out CVA. Chart review  mentions A. Fib, however no prior EKGs with A. Fib and EKG here with sinus tachycardia. Differential also includes underlying viral illness.  Patient's chronic benzodiazepine use may be contributing as well.  VBG without hypercapnia.  Potassium is low however mild.  No other electrolyte abnormalities to explain symptoms. Patient most likely having underlying undiagnosed dementia.  MRI brain was without any acute abnormality.  Urine cultures pending.  No urinary symptoms. TSH and B12 within normal limit.  1 out of 4  blood culture with gram-positive cocci but no organism identified, most likely contaminant.  Procalcitonin negative. - Restart home benzos to avoid withdrawal, however reduced dose -Start her on Keflex for concern of UTI  COPD exacerbation ([place]) Wheezing has been improved.  Patient uses 2 L at baseline but per grandson she is mostly noncompliant. Patient presenting with shortness of breath and pleuritic chest pain with diffuse wheezing on examination and evidence of tachypnea with accessory muscle use.  Likely viral in nature with negative procalcitonin.  Given lack of productive cough, reassuring chest x-ray and negative procalcitonin, will hold off on further antibiotic use. -Continue supplemental oxygen to maintain oxygen saturation above 90% - prednisone 40 mg for 5 days - DuoNebs every 6 hours - Restart home bronchodilators  Tachycardia Resolved. Per chart review, patient has a history of tachycardia.  On EKG today, consistent with sinus rhythm versus multifactorial atrial tachycardia (likely due to underlying COPD.  Given chronicity, no indication for telemetry monitoring.  Will restart prior metoprolol that she was on for this reason. - Restart home metoprolol  Essential hypertension Blood pressure mildly elevated. -Continue home amlodipine and metoprolol  Underweight Likely due to severe COPD versus malnutrition.  -Dietitian consultation   Subjective: Patient was seen and examined today.  She was oriented to self and partially to place.  She was telling quite irrelevant stories.  Denies any pain or other symptoms.  When asked specifically about urinary symptoms, 1 time she said no the other times she said yes to increase urination.  No pain.  Physical Exam: Vitals:   11/09/22 0600 11/09/22 0630 11/09/22 0730 11/09/22 0848  BP: (!) 155/65 139/71 (!) 154/50   Pulse: 80 80 78   Resp: 19 16 (!) 22   Temp:    98.6 F (37 C)  TempSrc:      SpO2: 100% 97% 97%   Weight:       Height:       General.  Frail elderly lady, in no acute distress. Pulmonary.  Lungs clear bilaterally, normal respiratory effort. CV.  Regular rate and rhythm, no JVD, rub or murmur. Abdomen.  Soft, nontender, nondistended, BS positive. CNS.  Alert and oriented .  No focal neurologic deficit. Extremities.  No edema, no cyanosis, pulses intact and symmetrical. Psychiatry.  Judgment and insight appears impaired  Data Reviewed: Prior data reviewed  Family Communication: Discussed with grandson at bedside  Disposition: Status is: Observation The patient remains OBS appropriate and will d/c before 2 midnights.  Planned Discharge Destination: Home  DVT prophylaxis.  Lovenox Time spent: 50 minutes  This record has been created using Systems analyst. Errors have been sought and corrected,but may not always be located. Such creation errors do not reflect on the standard of care.   Author: Lorella Nimrod, MD 11/09/2022 3:28 PM  For on call review [email or web address].

## 2022-11-10 DIAGNOSIS — R636 Underweight: Secondary | ICD-10-CM | POA: Diagnosis not present

## 2022-11-10 DIAGNOSIS — I10 Essential (primary) hypertension: Secondary | ICD-10-CM | POA: Diagnosis not present

## 2022-11-10 DIAGNOSIS — J441 Chronic obstructive pulmonary disease with (acute) exacerbation: Secondary | ICD-10-CM | POA: Diagnosis not present

## 2022-11-10 DIAGNOSIS — R410 Disorientation, unspecified: Secondary | ICD-10-CM | POA: Diagnosis not present

## 2022-11-10 LAB — CBC WITH DIFFERENTIAL/PLATELET
Abs Immature Granulocytes: 0.04 10*3/uL (ref 0.00–0.07)
Basophils Absolute: 0 10*3/uL (ref 0.0–0.1)
Basophils Relative: 0 %
Eosinophils Absolute: 0 10*3/uL (ref 0.0–0.5)
Eosinophils Relative: 0 %
HCT: 34.4 % — ABNORMAL LOW (ref 36.0–46.0)
Hemoglobin: 10.5 g/dL — ABNORMAL LOW (ref 12.0–15.0)
Immature Granulocytes: 0 %
Lymphocytes Relative: 24 %
Lymphs Abs: 2.7 10*3/uL (ref 0.7–4.0)
MCH: 26.1 pg (ref 26.0–34.0)
MCHC: 30.5 g/dL (ref 30.0–36.0)
MCV: 85.4 fL (ref 80.0–100.0)
Monocytes Absolute: 0.6 10*3/uL (ref 0.1–1.0)
Monocytes Relative: 5 %
Neutro Abs: 7.8 10*3/uL — ABNORMAL HIGH (ref 1.7–7.7)
Neutrophils Relative %: 71 %
Platelets: 278 10*3/uL (ref 150–400)
RBC: 4.03 MIL/uL (ref 3.87–5.11)
RDW: 15.5 % (ref 11.5–15.5)
WBC: 11.1 10*3/uL — ABNORMAL HIGH (ref 4.0–10.5)
nRBC: 0 % (ref 0.0–0.2)

## 2022-11-10 MED ORDER — CEPHALEXIN 500 MG PO CAPS: 500.0000 mg | ORAL_CAPSULE | Freq: Three times a day (TID) | ORAL | 0 refills | Status: AC

## 2022-11-10 MED ORDER — VITAMIN B-1 100 MG PO TABS: 100.0000 mg | ORAL_TABLET | Freq: Every day | ORAL | 1 refills | Status: DC

## 2022-11-10 MED ORDER — KETOROLAC TROMETHAMINE 15 MG/ML IJ SOLN
15.0000 mg | Freq: Once | INTRAMUSCULAR | Status: AC
  Administered 2022-11-10: 15 mg via INTRAVENOUS
  Filled 2022-11-10: qty 1

## 2022-11-10 MED ORDER — ATORVASTATIN CALCIUM 10 MG PO TABS: 10.0000 mg | ORAL_TABLET | Freq: Every day | ORAL | 11 refills | Status: DC

## 2022-11-10 MED ORDER — METOPROLOL TARTRATE 50 MG PO TABS: 50.0000 mg | ORAL_TABLET | Freq: Two times a day (BID) | ORAL | 2 refills | Status: DC

## 2022-11-10 MED ORDER — ADULT MULTIVITAMIN W/MINERALS CH: 1.0000 | ORAL_TABLET | Freq: Every day | ORAL | 1 refills | Status: DC

## 2022-11-10 MED ORDER — AMLODIPINE BESYLATE 5 MG PO TABS: 5.0000 mg | ORAL_TABLET | Freq: Every day | ORAL | 1 refills | Status: DC

## 2022-11-10 MED ORDER — PREDNISONE 20 MG PO TABS: 40.0000 mg | ORAL_TABLET | Freq: Every day | ORAL | 0 refills | Status: AC

## 2022-11-10 NOTE — ED Notes (Signed)
Patient's IV is not drawing back, and she is refusing a straight stick to obtain morning labs at this time.

## 2022-11-10 NOTE — ED Notes (Addendum)
Contacted cross coverage at this time to verify patient is able to be discharged from ed with private care sitter and provider. States patient is good to go. Patient discharged from Ed at this time with caregiver via wheelchair with home caretaker.

## 2022-11-10 NOTE — Progress Notes (Signed)
Mr. Katrine Radich called me. He explained his concerns were her symptoms were not any better, she has been in the ED for two days, and he thought she would be tested for dementia. I explained she had been evaluated by the MD and there was not a reason to keep her in the hospital and that testing for dementia was something that could be done outpatient. He stated someone would be up to pick her up this evening. I gave him the [facility] number to call if needed.

## 2022-11-10 NOTE — Discharge Summary (Signed)
Physician Discharge Summary   Patient: [name] MRN: [id number] DOB: [birth year]  Admit date:     11/08/2022  Discharge date: 11/10/22  Discharge Physician: Lorella Nimrod   PCP: Albina Billet, MD   Recommendations at discharge:  Please obtain CBC and BMP in 1 week Follow-up on final culture results Follow-up with PCP within a week  Discharge Diagnoses: Principal Problem:   AMS (altered mental status) Active Problems:   COPD exacerbation ([place])   Tachycardia   Essential hypertension   Underweight   Hospital Course: Taken from H&P.  [name] is a 76 y.o. female with medical history significant of CAD, COPD, atrial fibrillation, depression, hypertension, hyperlipidemia, hypothyroidism, who presents to the ED due to altered mental status.  History obtained from chart review and from patient.   Ms. Sease states she is unsure why she is here in the hospital.  She believes it may be a family member that led to her being brought here.  She endorses shortness of breath and chest pain with deep breaths, but denies any cough, nausea, vomiting, diarrhea, abdominal pain or generalized weakness at this time.  She is unsure if she has been experiencing shortness of breath at home.   Per chart review, patient's home health nurse came to check on her today and found her on the couch covered in blankets and confused.  At baseline, patient is alert and oriented x 3 and at the time patient was disoriented.  ED course: On arrival to the ED, patient was hypertensive at 176/91 with heart rate of 108.  She was saturating at 99% on 2 L with a respiratory rate of 34.  Initial VBG with pH of 7.48 and pCO2 of 56.  Initial blood work remarkable for potassium of 2.9, bicarb of 37, BUN of 24, initial troponin of 29 with flat trend to 27, elevated WBC at 12.2, platelets of 494.  Urinalysis with hematuria, ketonuria and small leukocytes, however no bacteria.  Chest x-ray with no opacities. EKG with sinus  rhythm, rate of 110, no acute ST or T wave changes concerning for ischemia. CT head with no acute intracranial abnormalities.  Due to concern for COPD, patient was started on prednisone and Solu-Medrol.  12/21: Vital seems stable.  Saturating 97% on 2 L.  COVID-19, influenza and RSV PCR was negative.  Respiratory viral panel negative.  1 out of 4 blood culture bottles with gram-positive cocci or rods with no organism identified on panel.  Most likely a contaminant.  Procalcitonin negative.  TSH within normal limit at 0.515, B12 at 482.  Urine cultures pending.  Hypokalemia has been resolved, potassium at 4.  Mild hypernatremia with sodium of 146-ordered D5 total of 1 L, encouraging p.o. hydration. MRI brain was without any acute abnormality. Had a long discussion with grandson, it appears that she is having some underlying dementia but there was no formal diagnosis so he was not sure what is going on.  Patient was oriented to self and partially oriented to place.  She was telling very irrelevant stories on talking.  Per grandson she was doing that at home and tried to jump from 1 topic to the other very frequently. She lives alone and 2 private caretakers take turns to take care of her, he is currently in the process of moving her to assisted living facility.  He was requesting 1 more night so he can make arrangements with her caregivers.  He himself is recovering from flu and will  not be able to participate actively in her care.  12/22: Afebrile with stable vitals, patient refused morning labs as she does not want another stick.  Urine cultures still pending.  Patient wants to go home.  She is more alert and oriented and appears to be at her baseline today.  Patient was found to be not using most of her home medications she was advised to follow-up closely with primary care provider and take medications as advised.  She is being discharged on Keflex for total of 5 days.  Patient was also taking high  doses of alprazolam which can be contributory to her disorientation.  She will need a taper which should be done by her PCP.  Patient will continue on current medications and need to have a close follow-up with PCP.  She will need memory testing to make the final diagnosis of dementia and its severity which can be done at PCP office.  Assessment and Plan: * AMS (altered mental status) Patient presenting with 1 day history of altered mental status with last known normal yesterday.  Etiology undetermined at this time.  Urinalysis with small leukocytes but no bacteria seen.  Although examination nonfocal, will obtain MRI brain to rule out CVA. Chart review mentions A. Fib, however no prior EKGs with A. Fib and EKG here with sinus tachycardia. Differential also includes underlying viral illness.  Patient's chronic benzodiazepine use may be contributing as well.  VBG without hypercapnia.  Potassium is low however mild.  No other electrolyte abnormalities to explain symptoms. Patient most likely having underlying undiagnosed dementia.  MRI brain was without any acute abnormality.  Urine cultures pending.  No urinary symptoms. TSH and B12 within normal limit.  1 out of 4 blood culture with gram-positive cocci but no organism identified, most likely contaminant.  Procalcitonin negative. - Restart home benzos to avoid withdrawal, however reduced dose -Start her on Keflex for concern of UTI  COPD exacerbation ([place]) Wheezing has been improved.  Patient uses 2 L at baseline but per grandson she is mostly noncompliant. Patient presenting with shortness of breath and pleuritic chest pain with diffuse wheezing on examination and evidence of tachypnea with accessory muscle use.  Likely viral in nature with negative procalcitonin.  Given lack of productive cough, reassuring chest x-ray and negative procalcitonin, will hold off on further antibiotic use. -Continue supplemental oxygen to maintain oxygen saturation above  90% - prednisone 40 mg for 5 days - DuoNebs every 6 hours - Restart home bronchodilators  Tachycardia Resolved. Per chart review, patient has a history of tachycardia.  On EKG today, consistent with sinus rhythm versus multifactorial atrial tachycardia (likely due to underlying COPD.  Given chronicity, no indication for telemetry monitoring.  Will restart prior metoprolol that she was on for this reason. - Restart home metoprolol  Essential hypertension Blood pressure mildly elevated. -Continue home amlodipine and metoprolol  Underweight Likely due to severe COPD versus malnutrition.  -Dietitian consultation   Consultants: None Procedures performed: None Disposition: Home Diet recommendation:  Discharge Diet Orders (From admission, onward)     Start     Ordered   11/10/22 0000  Diet - low sodium heart healthy        11/10/22 1031           Cardiac diet DISCHARGE MEDICATION: Allergies as of 11/10/2022   No Known Allergies      Medication List     STOP taking these medications    apixaban 5 MG Tabs tablet  Commonly known as: ELIQUIS   Fluticasone-Salmeterol 250-50 MCG/DOSE Aepb Commonly known as: ADVAIR   HYDROcodone-acetaminophen 10-325 MG tablet Commonly known as: NORCO   montelukast 10 MG tablet Commonly known as: SINGULAIR   oxyCODONE-acetaminophen 5-325 MG tablet Commonly known as: Percocet   Vitamin D (Ergocalciferol) 1.25 MG (50000 UNIT) Caps capsule Commonly known as: DRISDOL       TAKE these medications    albuterol 108 (90 Base) MCG/ACT inhaler Commonly known as: VENTOLIN HFA Inhale 1-2 puffs into the lungs every 4 (four) hours as needed for shortness of breath or wheezing.   ALPRAZolam 1 MG tablet Commonly known as: XANAX Take 1 mg by mouth 3 (three) times daily as needed.   amLODipine 5 MG tablet Commonly known as: NORVASC Take 1 tablet (5 mg total) by mouth daily.   ammonium lactate 12 % lotion Commonly known as:  AmLactin Apply 1 Application topically as needed for dry skin.   atorvastatin 10 MG tablet Commonly known as: Lipitor Take 1 tablet (10 mg total) by mouth daily.   cephALEXin 500 MG capsule Commonly known as: KEFLEX Take 1 capsule (500 mg total) by mouth every 8 (eight) hours for 4 days.   Combivent Respimat 20-100 MCG/ACT Aers respimat Generic drug: Ipratropium-Albuterol Inhale 1-2 puffs into the lungs 4 (four) times daily.   levothyroxine 50 MCG tablet Commonly known as: SYNTHROID Take 50 mcg by mouth every morning. What changed: Another medication with the same name was removed. Continue taking this medication, and follow the directions you see here.   metoprolol tartrate 50 MG tablet Commonly known as: LOPRESSOR Take 1 tablet (50 mg total) by mouth 2 (two) times daily.   multivitamin with minerals Tabs tablet Take 1 tablet by mouth daily.   nutrition supplement (JUVEN) Pack Take 1 packet by mouth 2 (two) times daily between meals.   polyethylene glycol 17 g packet Commonly known as: MIRALAX / GLYCOLAX Take 17 g by mouth daily.   predniSONE 20 MG tablet Commonly known as: DELTASONE Take 2 tablets (40 mg total) by mouth daily with breakfast for 3 days.   senna-docusate 8.6-50 MG tablet Commonly known as: Senokot-S Take 2 tablets by mouth at bedtime.   thiamine 100 MG tablet Commonly known as: Vitamin B-1 Take 1 tablet (100 mg total) by mouth daily.   traZODone 150 MG tablet Commonly known as: DESYREL Take 300 mg by mouth at bedtime as needed.        Follow-up Information     Albina Billet, MD. Schedule an appointment as soon as possible for a visit in 1 week(s).   Specialty: Internal Medicine Contact information: [address] [place] [address] [phone]                Discharge Exam: Danley Danker Weights   11/08/22 1537  Weight: 45.4 kg   General.  Frail and malnourished elderly lady, in no acute distress. Pulmonary.  Lungs clear  bilaterally, normal respiratory effort. CV.  Regular rate and rhythm, no JVD, rub or murmur. Abdomen.  Soft, nontender, nondistended, BS positive. CNS.  Alert and oriented .  No focal neurologic deficit. Extremities.  No edema, no cyanosis, pulses intact and symmetrical. Psychiatry.  Judgment and insight appears impaired  Condition at discharge: stable  The results of significant diagnostics from this hospitalization (including imaging, microbiology, ancillary and laboratory) are listed below for reference.   Imaging Studies: MR BRAIN WO CONTRAST  Result Date: 11/09/2022 CLINICAL DATA:  Mental  status change, unknown cause EXAM: MRI HEAD WITHOUT CONTRAST TECHNIQUE: Multiplanar, multiecho pulse sequences of the brain and surrounding structures were obtained without intravenous contrast. COMPARISON:  CT head 11/08/2022. FINDINGS: Brain: No acute infarction, hemorrhage, hydrocephalus, extra-axial collection or mass lesion. Moderate patchy T2/FLAIR hyperintensity in the white matter, nonspecific but compatible with chronic microvascular ischemic disease. Tiny remote cerebellar lacunar infarcts. Vascular: Major arterial flow voids are maintained at the skull base. Skull and upper cervical spine: Normal marrow signal. Sinuses/Orbits: Clear sinuses.  No acute orbital findings. IMPRESSION: No evidence of acute intracranial abnormality. Electronically Signed   By: Margaretha Sheffield M.D.   On: 11/09/2022 12:39   CT Head Wo Contrast  Result Date: 11/08/2022 CLINICAL DATA:  Altered mental status EXAM: CT HEAD WITHOUT CONTRAST TECHNIQUE: Contiguous axial images were obtained from the base of the skull through the vertex without intravenous contrast. RADIATION DOSE REDUCTION: This exam was performed according to the departmental dose-optimization program which includes automated exposure control, adjustment of the mA and/or kV according to patient size and/or use of iterative reconstruction technique.  COMPARISON:  CT head 06/29/22 FINDINGS: Brain: No evidence of acute infarction, hemorrhage, hydrocephalus, extra-axial collection or mass lesion/mass effect. Sequela of moderate chronic microvascular ischemic change.Generalized volume loss. Vascular: No hyperdense vessel or unexpected calcification. Skull: Normal. Negative for fracture or focal lesion. Sinuses/Orbits: Bilateral lens replacement Other: None. IMPRESSION: No acute intracranial abnormality Electronically Signed   By: Marin Roberts M.D.   On: 11/08/2022 14:03   DG Chest Port 1 View  Result Date: 11/08/2022 CLINICAL DATA:  Sepsis. EXAM: PORTABLE CHEST 1 VIEW COMPARISON:  June 29, 2022. FINDINGS: The heart size and mediastinal contours are within normal limits. Both lungs are clear. The visualized skeletal structures are unremarkable. IMPRESSION: No active disease. Electronically Signed   By: Marijo Conception M.D.   On: 11/08/2022 10:41    Microbiology: Results for orders placed or performed during the hospital encounter of 11/08/22  Resp panel by RT-PCR (RSV, Flu A&B, Covid) Anterior Nasal Swab     Status: None   Collection Time: 11/08/22 10:23 AM   Specimen: Anterior Nasal Swab  Result Value Ref Range Status   SARS Coronavirus 2 by RT PCR NEGATIVE NEGATIVE Final    Comment: (NOTE) SARS-CoV-2 target nucleic acids are NOT DETECTED.  The SARS-CoV-2 RNA is generally detectable in upper respiratory specimens during the acute phase of infection. The lowest concentration of SARS-CoV-2 viral copies this assay can detect is 138 copies/mL. A negative result does not preclude SARS-Cov-2 infection and should not be used as the sole basis for treatment or other patient management decisions. A negative result may occur with  improper specimen collection/handling, submission of specimen other than nasopharyngeal swab, presence of viral mutation(s) within the areas targeted by this assay, and inadequate number of viral copies(<138 copies/mL). A  negative result must be combined with clinical observations, patient history, and epidemiological information. The expected result is Negative.  Fact Sheet for Patients:  EntrepreneurPulse.com.au  Fact Sheet for Healthcare Providers:  IncredibleEmployment.be  This test is no t yet approved or cleared by the [place] FDA and  has been authorized for detection and/or diagnosis of SARS-CoV-2 by FDA under an Emergency Use Authorization (EUA). This EUA will remain  in effect (meaning this test can be used) for the duration of the COVID-19 declaration under Section 564(b)(1) of the Act, 21 U.S.C.section 360bbb-3(b)(1), unless the authorization is terminated  or revoked sooner.       Influenza A  by PCR NEGATIVE NEGATIVE Final   Influenza B by PCR NEGATIVE NEGATIVE Final    Comment: (NOTE) The Xpert Xpress SARS-CoV-2/FLU/RSV plus assay is intended as an aid in the diagnosis of influenza from Nasopharyngeal swab specimens and should not be used as a sole basis for treatment. Nasal washings and aspirates are unacceptable for Xpert Xpress SARS-CoV-2/FLU/RSV testing.  Fact Sheet for Patients: EntrepreneurPulse.com.au  Fact Sheet for Healthcare Providers: IncredibleEmployment.be  This test is not yet approved or cleared by the [place] FDA and has been authorized for detection and/or diagnosis of SARS-CoV-2 by FDA under an Emergency Use Authorization (EUA). This EUA will remain in effect (meaning this test can be used) for the duration of the COVID-19 declaration under Section 564(b)(1) of the Act, 21 U.S.C. section 360bbb-3(b)(1), unless the authorization is terminated or revoked.     Resp Syncytial Virus by PCR NEGATIVE NEGATIVE Final    Comment: (NOTE) Fact Sheet for Patients: EntrepreneurPulse.com.au  Fact Sheet for Healthcare  Providers: IncredibleEmployment.be  This test is not yet approved or cleared by the [place] FDA and has been authorized for detection and/or diagnosis of SARS-CoV-2 by FDA under an Emergency Use Authorization (EUA). This EUA will remain in effect (meaning this test can be used) for the duration of the COVID-19 declaration under Section 564(b)(1) of the Act, 21 U.S.C. section 360bbb-3(b)(1), unless the authorization is terminated or revoked.  Performed at [facility], [address] [place], [place] [address]   Blood Culture (routine x 2)     Status: None (Preliminary result)   Collection Time: 11/08/22 10:28 AM   Specimen: BLOOD  Result Value Ref Range Status   Specimen Description BLOOD  LEFT FOREARM  Final   Special Requests   Final    BOTTLES DRAWN AEROBIC AND ANAEROBIC Blood Culture results may not be optimal due to an inadequate volume of blood received in culture bottles   Culture   Final    NO GROWTH 2 DAYS Performed at [facility], [address], [place], [place] [address]    Report Status PENDING  Incomplete  Blood Culture (routine x 2)     Status: None (Preliminary result)   Collection Time: 11/08/22 10:30 AM   Specimen: BLOOD  Result Value Ref Range Status   Specimen Description   Final    BLOOD RIGHT FA Performed at [facility], [address] [place], [place] [address]    Special Requests   Final    BOTTLES DRAWN AEROBIC AND ANAEROBIC Blood Culture results may not be optimal due to an excessive volume of blood received in culture bottles Performed at [facility], [address], [place], [place] [address]    Culture  Setup Time   Final    GRAM POSITIVE COCCI CRITICAL RESULT CALLED TO, READ BACK BY AND VERIFIED WITH: JASON ROBBINS 11/09/22 0446 MW AEROBIC BOTTLE ONLY    Culture   Final    GRAM POSITIVE COCCI IDENTIFICATION TO FOLLOW Performed at [facility], [place] [address],  [place],  [address]    Report Status PENDING  Incomplete  Blood Culture ID Panel (Reflexed)     Status: None   Collection Time: 11/08/22 10:30 AM  Result Value Ref Range Status   Enterococcus faecalis NOT DETECTED NOT DETECTED Final   Enterococcus Faecium NOT DETECTED NOT DETECTED Final   Listeria monocytogenes NOT DETECTED NOT DETECTED Final   Staphylococcus species NOT DETECTED NOT DETECTED Final   Staphylococcus aureus (  BCID) NOT DETECTED NOT DETECTED Final   Staphylococcus epidermidis NOT DETECTED NOT DETECTED Final   Staphylococcus lugdunensis NOT DETECTED NOT DETECTED Final   Streptococcus species NOT DETECTED NOT DETECTED Final   Streptococcus agalactiae NOT DETECTED NOT DETECTED Final   Streptococcus pneumoniae NOT DETECTED NOT DETECTED Final   Streptococcus pyogenes NOT DETECTED NOT DETECTED Final   A.calcoaceticus-baumannii NOT DETECTED NOT DETECTED Final   Bacteroides fragilis NOT DETECTED NOT DETECTED Final   Enterobacterales NOT DETECTED NOT DETECTED Final   Enterobacter cloacae complex NOT DETECTED NOT DETECTED Final   Escherichia coli NOT DETECTED NOT DETECTED Final   Klebsiella aerogenes NOT DETECTED NOT DETECTED Final   Klebsiella oxytoca NOT DETECTED NOT DETECTED Final   Klebsiella pneumoniae NOT DETECTED NOT DETECTED Final   Proteus species NOT DETECTED NOT DETECTED Final   Salmonella species NOT DETECTED NOT DETECTED Final   Serratia marcescens NOT DETECTED NOT DETECTED Final   Haemophilus influenzae NOT DETECTED NOT DETECTED Final   Neisseria meningitidis NOT DETECTED NOT DETECTED Final   Pseudomonas aeruginosa NOT DETECTED NOT DETECTED Final   Stenotrophomonas maltophilia NOT DETECTED NOT DETECTED Final   Candida albicans NOT DETECTED NOT DETECTED Final   Candida auris NOT DETECTED NOT DETECTED Final   Candida glabrata NOT DETECTED NOT DETECTED Final   Candida krusei NOT DETECTED NOT DETECTED Final   Candida parapsilosis NOT DETECTED NOT DETECTED Final    Candida tropicalis NOT DETECTED NOT DETECTED Final   Cryptococcus neoformans/gattii NOT DETECTED NOT DETECTED Final    Comment: Performed at [facility], [address], [place], [place] [address]  Urine Culture     Status: None (Preliminary result)   Collection Time: 11/08/22 12:52 PM   Specimen: In/Out Cath Urine  Result Value Ref Range Status   Specimen Description   Final    IN/OUT CATH URINE Performed at [facility], [address], [place], [place] [address]    Special Requests   Final    NONE Performed at [facility], [address] [place], [place] [address]    Culture   Final    CULTURE REINCUBATED FOR BETTER GROWTH Performed at [facility], [address], [place], [place] [address]    Report Status PENDING  Incomplete  Respiratory (~20 pathogens) panel by PCR     Status: None   Collection Time: 11/08/22  2:19 PM   Specimen: Nasopharyngeal Swab; Respiratory  Result Value Ref Range Status   Adenovirus NOT DETECTED NOT DETECTED Final   Coronavirus 229E NOT DETECTED NOT DETECTED Final    Comment: (NOTE) The Coronavirus on the Respiratory Panel, DOES NOT test for the novel  Coronavirus (2019 nCoV)    Coronavirus HKU1 NOT DETECTED NOT DETECTED Final   Coronavirus NL63 NOT DETECTED NOT DETECTED Final   Coronavirus OC43 NOT DETECTED NOT DETECTED Final   Metapneumovirus NOT DETECTED NOT DETECTED Final   Rhinovirus / Enterovirus NOT DETECTED NOT DETECTED Final   Influenza A NOT DETECTED NOT DETECTED Final   Influenza B NOT DETECTED NOT DETECTED Final   Parainfluenza Virus 1 NOT DETECTED NOT DETECTED Final   Parainfluenza Virus 2 NOT DETECTED NOT DETECTED Final   Parainfluenza Virus 3 NOT DETECTED NOT DETECTED Final   Parainfluenza Virus 4 NOT DETECTED NOT DETECTED Final   Respiratory Syncytial Virus NOT DETECTED NOT DETECTED Final   Bordetella pertussis NOT DETECTED NOT DETECTED Final   Bordetella Parapertussis NOT DETECTED NOT  DETECTED Final   Chlamydophila pneumoniae NOT DETECTED NOT  DETECTED Final   Mycoplasma pneumoniae NOT DETECTED NOT DETECTED Final    Comment: Performed at [facility], [place] [address], [place], [place] [address]    Labs: CBC: Recent Labs  Lab 11/08/22 1030 11/10/22 0852  WBC 12.2* 11.1*  NEUTROABS 9.4* 7.8*  HGB 13.0 10.5*  HCT 42.9 34.4*  MCV 86.7 85.4  PLT 494* 919   Basic Metabolic Panel: Recent Labs  Lab 11/08/22 1030 11/09/22 0518  NA 144 146*  K 2.9* 4.0  CL 94* 112*  CO2 37* 26  GLUCOSE 122* 98  BUN 24* 16  CREATININE 0.81 0.59  CALCIUM 9.8 8.1*  MG 1.7  --    Liver Function Tests: Recent Labs  Lab 11/08/22 1030  AST 28  ALT 16  ALKPHOS 112  BILITOT 1.0  PROT 8.1  ALBUMIN 4.1   CBG: No results for input(s): "GLUCAP" in the last 168 hours.  Discharge time spent: greater than 30 minutes.  This record has been created using Systems analyst. Errors have been sought and corrected,but may not always be located. Such creation errors do not reflect on the standard of care.   Signed: Lorella Nimrod, MD [facility] 11/10/2022

## 2022-11-10 NOTE — Evaluation (Signed)
Physical Therapy Evaluation Patient Details Name: [name] MRN: [id number] DOB: 1946-07-05 Today's Date: 11/10/2022  History of Present Illness  76 y/o female presented to ED on 11/08/22 for AMS. MRI brain negative. Admitted for COPD exacerbation and AMS with unknown etiology. PMH: CAD, COPD, Afib, depression, HTN, PVD  Clinical Impression  Patient admitted with the above. PTA, patient lives alone but has caregivers to assist her. Son is planning to move her into ALF soon. Ambulates with RW and/or rollator at baseline. Patient presents with weakness, impaired balance, decreased activity tolerance, and impaired cognition. Oriented to self and time, able to state "[place]" but follows up with "in [place]". Patient requires supervision for bed mobility and min guard for sit to stand x3 with HHAx1. Ambulated very short distance in the room with HHAx1 and minA but demos posterior bias and easily fatigues and requesting to end session. Patient will benefit from skilled PT services during acute stay to address listed deficits. Recommend HHPT and frequent supervision for safety due to cognitive deficits.         Recommendations for follow up therapy are one component of a multi-disciplinary discharge planning process, led by the attending physician.  Recommendations may be updated based on patient status, additional functional criteria and insurance authorization.  Follow Up Recommendations Home health PT      Assistance Recommended at Discharge Frequent or constant Supervision/Assistance  Patient can return home with the following  A little help with walking and/or transfers;A little help with bathing/dressing/bathroom;Assistance with cooking/housework;Direct supervision/assist for medications management;Direct supervision/assist for financial management;Assist for transportation;Help with stairs or ramp for entrance    Equipment Recommendations None recommended by PT   Recommendations for Other Services       Functional Status Assessment Patient has had a recent decline in their functional status and demonstrates the ability to make significant improvements in function in a reasonable and predictable amount of time.     Precautions / Restrictions Precautions Precautions: Fall Restrictions Weight Bearing Restrictions: No      Mobility  Bed Mobility Overal bed mobility: Needs Assistance Bed Mobility: Supine to Sit, Sit to Supine     Supine to sit: Supervision Sit to supine: Supervision        Transfers Overall transfer level: Needs assistance Equipment used: 1 person hand held assist Transfers: Sit to/from Stand Sit to Stand: Min guard           General transfer comment: min guard for safety. Sit to stand x 3 for brief change and mobility    Ambulation/Gait Ambulation/Gait assistance: Min assist Gait Distance (Feet): 5 Feet Assistive device: 1 person hand held assist Gait Pattern/deviations: Step-to pattern, Decreased stride length Gait velocity: decreased     General Gait Details: assist for balance due to posterior bias. Reports being easily fatigued and requesting to end session. VSS on 2L  Stairs            Wheelchair Mobility    Modified Rankin (Stroke Patients Only)       Balance Overall balance assessment: Mild deficits observed, not formally tested, History of Falls                                           Pertinent Vitals/Pain Pain Assessment Pain Assessment: No/denies pain    Home Living Family/patient expects to be discharged to:: Private residence Living Arrangements: Alone Available  Help at Discharge: Family;Personal care attendant;Available PRN/intermittently (PCA for 2 hours a day, son planning to move patient into ALF) Type of Home: House Home Access: Stairs to enter Entrance Stairs-Rails: Left Entrance Stairs-Number of Steps: 4   Home Layout: One level Home  Equipment: Conservation officer, nature (2 wheels);Rollator (4 wheels);Shower seat Additional Comments: information obtained from 06/2022 admission    Prior Function Prior Level of Function : Independent/Modified Independent             Mobility Comments: amb with rollator ADLs Comments: pt performs ADL with SET UP, aids assit with IADLs     Hand Dominance        Extremity/Trunk Assessment   Upper Extremity Assessment Upper Extremity Assessment: Generalized weakness    Lower Extremity Assessment Lower Extremity Assessment: Generalized weakness    Cervical / Trunk Assessment Cervical / Trunk Assessment: Kyphotic  Communication   Communication: HOH  Cognition Arousal/Alertness: Awake/alert Behavior During Therapy: WFL for tasks assessed/performed Overall Cognitive Status: No family/caregiver present to determine baseline cognitive functioning                                 General Comments: Oriented to self and time. Able to state "[place]" but follows up with "in [place]". Reports she has been in the hospital for a week or more. Follows commands appropriately but confused        General Comments      Exercises     Assessment/Plan    PT Assessment Patient needs continued PT services  PT Problem List Decreased strength;Decreased activity tolerance;Decreased mobility;Decreased balance;Cardiopulmonary status limiting activity;Decreased safety awareness;Decreased cognition;Decreased knowledge of use of DME;Decreased knowledge of precautions       PT Treatment Interventions DME instruction;Gait training;Functional mobility training;Stair training;Therapeutic activities;Therapeutic exercise;Balance training;Patient/family education    PT Goals (Current goals can be found in the Care Plan section)  Acute Rehab PT Goals Patient Stated Goal: to go home PT Goal Formulation: Patient unable to participate in goal setting Time For Goal Achievement:  11/24/22 Potential to Achieve Goals: Fair    Frequency Min 2X/week     Co-evaluation               AM-PAC PT "6 Clicks" Mobility  Outcome Measure Help needed turning from your back to your side while in a flat bed without using bedrails?: A Little Help needed moving from lying on your back to sitting on the side of a flat bed without using bedrails?: A Little Help needed moving to and from a bed to a chair (including a wheelchair)?: A Little Help needed standing up from a chair using your arms (e.g., wheelchair or bedside chair)?: A Little Help needed to walk in hospital room?: A Lot Help needed climbing 3-5 steps with a railing? : A Lot 6 Click Score: 16    End of Session Equipment Utilized During Treatment: Gait belt;Oxygen Activity Tolerance: Patient tolerated treatment well Patient left: in bed;with call bell/phone within reach;with bed alarm set Nurse Communication: Mobility status PT Visit Diagnosis: Unsteadiness on feet (R26.81);Muscle weakness (generalized) (M62.81);History of falling (Z91.81);Difficulty in walking, not elsewhere classified (R26.2)    Time: 6045-4098 PT Time Calculation (min) (ACUTE ONLY): 24 min   Charges:   PT Evaluation $PT Eval Moderate Complexity: 1 Mod PT Treatments $Therapeutic Activity: 8-22 mins        Ajaya Crutchfield A. Gilford Rile, PT, DPT [place] A  [place] Veld[place]izen 11/10/2022, 1:13 PM

## 2022-11-10 NOTE — ED Notes (Signed)
Pt area coordinator Ellison Hughs called and informed this RN that she will not be able to pick pt up unitl after 7p.  This RN informed coordinator that Yvetta Coder person care taker did state she was on the way.  Will continue plan of care.

## 2022-11-12 LAB — URINE CULTURE: Culture: 3000 — AB

## 2022-11-12 LAB — CULTURE, BLOOD (ROUTINE X 2)

## 2022-11-13 LAB — CULTURE, BLOOD (ROUTINE X 2): Culture: NO GROWTH

## 2022-12-29 ENCOUNTER — Other Ambulatory Visit: Payer: Self-pay | Admitting: Podiatry

## 2023-01-10 ENCOUNTER — Emergency Department: Payer: Medicare HMO

## 2023-01-10 ENCOUNTER — Other Ambulatory Visit: Payer: Self-pay

## 2023-01-10 ENCOUNTER — Inpatient Hospital Stay
Admission: EM | Admit: 2023-01-10 | Discharge: 2023-01-15 | DRG: 190 | Disposition: A | Discharge status: Hospice | Payer: Medicare HMO | Attending: Osteopathic Medicine | Admitting: Osteopathic Medicine

## 2023-01-10 DIAGNOSIS — K219 Gastro-esophageal reflux disease without esophagitis: Secondary | ICD-10-CM | POA: Diagnosis present

## 2023-01-10 DIAGNOSIS — E876 Hypokalemia: Secondary | ICD-10-CM | POA: Diagnosis not present

## 2023-01-10 DIAGNOSIS — I739 Peripheral vascular disease, unspecified: Secondary | ICD-10-CM | POA: Diagnosis present

## 2023-01-10 DIAGNOSIS — J9621 Acute and chronic respiratory failure with hypoxia: Secondary | ICD-10-CM | POA: Diagnosis present

## 2023-01-10 DIAGNOSIS — Z7951 Long term (current) use of inhaled steroids: Secondary | ICD-10-CM

## 2023-01-10 DIAGNOSIS — E785 Hyperlipidemia, unspecified: Secondary | ICD-10-CM | POA: Diagnosis present

## 2023-01-10 DIAGNOSIS — I10 Essential (primary) hypertension: Secondary | ICD-10-CM | POA: Diagnosis present

## 2023-01-10 DIAGNOSIS — E43 Unspecified severe protein-calorie malnutrition: Secondary | ICD-10-CM | POA: Diagnosis present

## 2023-01-10 DIAGNOSIS — Z79899 Other long term (current) drug therapy: Secondary | ICD-10-CM

## 2023-01-10 DIAGNOSIS — Z602 Problems related to living alone: Secondary | ICD-10-CM | POA: Diagnosis present

## 2023-01-10 DIAGNOSIS — F32A Depression, unspecified: Secondary | ICD-10-CM | POA: Diagnosis present

## 2023-01-10 DIAGNOSIS — F17210 Nicotine dependence, cigarettes, uncomplicated: Secondary | ICD-10-CM | POA: Diagnosis present

## 2023-01-10 DIAGNOSIS — Z681 Body mass index (BMI) 19 or less, adult: Secondary | ICD-10-CM

## 2023-01-10 DIAGNOSIS — Z1152 Encounter for screening for COVID-19: Secondary | ICD-10-CM

## 2023-01-10 DIAGNOSIS — Z66 Do not resuscitate: Secondary | ICD-10-CM | POA: Diagnosis not present

## 2023-01-10 DIAGNOSIS — F1320 Sedative, hypnotic or anxiolytic dependence, uncomplicated: Secondary | ICD-10-CM | POA: Diagnosis present

## 2023-01-10 DIAGNOSIS — Z515 Encounter for palliative care: Secondary | ICD-10-CM

## 2023-01-10 DIAGNOSIS — Z8701 Personal history of pneumonia (recurrent): Secondary | ICD-10-CM

## 2023-01-10 DIAGNOSIS — I2510 Atherosclerotic heart disease of native coronary artery without angina pectoris: Secondary | ICD-10-CM | POA: Diagnosis present

## 2023-01-10 DIAGNOSIS — J9601 Acute respiratory failure with hypoxia: Secondary | ICD-10-CM

## 2023-01-10 DIAGNOSIS — J441 Chronic obstructive pulmonary disease with (acute) exacerbation: Secondary | ICD-10-CM | POA: Diagnosis not present

## 2023-01-10 DIAGNOSIS — Z8711 Personal history of peptic ulcer disease: Secondary | ICD-10-CM

## 2023-01-10 DIAGNOSIS — F1090 Alcohol use, unspecified, uncomplicated: Secondary | ICD-10-CM | POA: Diagnosis present

## 2023-01-10 DIAGNOSIS — Z9049 Acquired absence of other specified parts of digestive tract: Secondary | ICD-10-CM

## 2023-01-10 DIAGNOSIS — Z716 Tobacco abuse counseling: Secondary | ICD-10-CM

## 2023-01-10 DIAGNOSIS — J9622 Acute and chronic respiratory failure with hypercapnia: Secondary | ICD-10-CM | POA: Diagnosis present

## 2023-01-10 DIAGNOSIS — E039 Hypothyroidism, unspecified: Secondary | ICD-10-CM | POA: Diagnosis present

## 2023-01-10 DIAGNOSIS — Z79890 Hormone replacement therapy (postmenopausal): Secondary | ICD-10-CM

## 2023-01-10 DIAGNOSIS — F40240 Claustrophobia: Secondary | ICD-10-CM | POA: Diagnosis present

## 2023-01-10 DIAGNOSIS — R451 Restlessness and agitation: Secondary | ICD-10-CM | POA: Diagnosis present

## 2023-01-10 DIAGNOSIS — Z981 Arthrodesis status: Secondary | ICD-10-CM

## 2023-01-10 DIAGNOSIS — Z89421 Acquired absence of other right toe(s): Secondary | ICD-10-CM

## 2023-01-10 DIAGNOSIS — R636 Underweight: Secondary | ICD-10-CM | POA: Diagnosis present

## 2023-01-10 LAB — COMPREHENSIVE METABOLIC PANEL
ALT: 12 U/L (ref 0–44)
AST: 17 U/L (ref 15–41)
Albumin: 3.5 g/dL (ref 3.5–5.0)
Alkaline Phosphatase: 82 U/L (ref 38–126)
Anion gap: 8 (ref 5–15)
BUN: 11 mg/dL (ref 8–23)
CO2: 36 mmol/L — ABNORMAL HIGH (ref 22–32)
Calcium: 8.5 mg/dL — ABNORMAL LOW (ref 8.9–10.3)
Chloride: 98 mmol/L (ref 98–111)
Creatinine, Ser: 0.64 mg/dL (ref 0.44–1.00)
GFR, Estimated: >60 mL/min (ref >60)
Glucose, Bld: 110 mg/dL — ABNORMAL HIGH (ref 70–99)
Potassium: 2.9 mmol/L — ABNORMAL LOW (ref 3.5–5.1)
Sodium: 142 mmol/L (ref 135–145)
Total Bilirubin: 0.8 mg/dL (ref 0.3–1.2)
Total Protein: 7.1 g/dL (ref 6.5–8.1)

## 2023-01-10 LAB — CBC WITH DIFFERENTIAL/PLATELET
Abs Immature Granulocytes: 0.02 10*3/uL (ref 0.00–0.07)
Basophils Absolute: 0.1 10*3/uL (ref 0.0–0.1)
Basophils Relative: 1 %
Eosinophils Absolute: 0.2 10*3/uL (ref 0.0–0.5)
Eosinophils Relative: 3 %
HCT: 38.4 % (ref 36.0–46.0)
Hemoglobin: 11.2 g/dL — ABNORMAL LOW (ref 12.0–15.0)
Immature Granulocytes: 0 %
Lymphocytes Relative: 26 %
Lymphs Abs: 1.7 10*3/uL (ref 0.7–4.0)
MCH: 27.2 pg (ref 26.0–34.0)
MCHC: 29.2 g/dL — ABNORMAL LOW (ref 30.0–36.0)
MCV: 93.2 fL (ref 80.0–100.0)
Monocytes Absolute: 0.4 10*3/uL (ref 0.1–1.0)
Monocytes Relative: 6 %
Neutro Abs: 4 10*3/uL (ref 1.7–7.7)
Neutrophils Relative %: 64 %
Platelets: 255 10*3/uL (ref 150–400)
RBC: 4.12 MIL/uL (ref 3.87–5.11)
RDW: 15.5 % (ref 11.5–15.5)
WBC: 6.4 10*3/uL (ref 4.0–10.5)
nRBC: 0 % (ref 0.0–0.2)

## 2023-01-10 LAB — BLOOD GAS, ARTERIAL
Acid-Base Excess: 15.6 mmol/L — ABNORMAL HIGH (ref 0.0–2.0)
Bicarbonate: 45.2 mmol/L — ABNORMAL HIGH (ref 20.0–28.0)
Delivery systems: POSITIVE
Expiratory PAP: 5 cmH2O
FIO2: 40 %
Inspiratory PAP: 10 cmH2O
O2 Saturation: 98.8 %
Patient temperature: 37
pCO2 arterial: 80 mmHg (ref 32–48)
pH, Arterial: 7.36 (ref 7.35–7.45)
pO2, Arterial: 106 mmHg (ref 83–108)

## 2023-01-10 LAB — MAGNESIUM: Magnesium: 1.7 mg/dL (ref 1.7–2.4)

## 2023-01-10 LAB — LACTIC ACID, PLASMA: Lactic Acid, Venous: 0.9 mmol/L (ref 0.5–1.9)

## 2023-01-10 LAB — TROPONIN I (HIGH SENSITIVITY): Troponin I (High Sensitivity): 14 ng/L (ref <18)

## 2023-01-10 MED ORDER — MAGNESIUM SULFATE 2 GM/50ML IV SOLN
2.0000 g | Freq: Once | INTRAVENOUS | Status: AC
  Administered 2023-01-10: 2 g via INTRAVENOUS
  Filled 2023-01-10: qty 50

## 2023-01-10 MED ORDER — IPRATROPIUM-ALBUTEROL 0.5-2.5 (3) MG/3ML IN SOLN
3.0000 mL | Freq: Once | RESPIRATORY_TRACT | Status: AC
  Administered 2023-01-10: 3 mL via RESPIRATORY_TRACT
  Filled 2023-01-10: qty 3

## 2023-01-10 NOTE — ED Provider Notes (Signed)
[facility] Provider Note    Event Date/Time   First MD Initiated Contact with Patient 01/10/23 2308     (approximate)   History   Respiratory Distress   HPI  [name] is a 77 y.o. female brought to the ED via EMS from home with a chief complaint of respiratory distress.  Patient with a history of COPD on baseline 3 L nasal cannula oxygen.  Endorses increased shortness of breath for 3 to 4 days.  Saturations on patient's baseline oxygen were 80% per EMS.  Given 2 albuterol nebulizer treatments, 1 DuoNeb and 125 mg IV Solu-Medrol en route to the ED.  Endorses chest tightness.  Denies fever/chills, cough, abdominal pain, nausea, vomiting or dizziness.     Past Medical History   Past Medical History:  Diagnosis Date   Anxiety    CAD (coronary artery disease)    Carpal tunnel syndrome    Cataract    Complication of anesthesia    has woken up at times   COPD (chronic obstructive pulmonary disease) (HCC)    CRP elevated 09/14/2015   Depression    Difficult intubation    Dyslipidemia    Dyspnea    DOE   Dysrhythmia    hx palpatations   Elevated sedimentation rate 09/14/2015   Esophageal spasm    Gastrointestinal parasites    GERD (gastroesophageal reflux disease)    HCAP (healthcare-associated pneumonia) 10/22/2018   Hiatal hernia    History of peptic ulcer disease    Hypertension    Hyperthyroidism    Hypothyroidism    Low magnesium levels 09/14/2015   Pelvic fracture ([place]) 2008   fall from riding a horse   Peripheral vascular disease ([place])    Reflux    Rotator cuff injury    Sepsis ([place]) 10/22/2018   Stenosis, spinal, lumbar      Active Problem List   Patient Active Problem List   Diagnosis Date Noted   AMS (altered mental status) 11/08/2022   Underweight 11/08/2022   Acute on chronic respiratory failure with hypoxia ([place]) 06/30/2022   Osteomyelitis of fifth toe of right foot ([place]) 06/30/2022   Dyslipidemia 06/30/2022    Subacute osteomyelitis of right foot ([place])    Essential hypertension    Protein-calorie malnutrition, severe 11/21/2021   COPD with acute exacerbation ([place]) 11/21/2021   COPD exacerbation ([place]) 11/20/2021   Chronic pain - multiple sites arthritis 11/20/2021   Benzodiazepine dependence, continuous ([place]) 11/20/2021   Peripheral vascular disease ([place]) 11/20/2021   Hyponatremia 11/20/2021   Hypocalcemia 11/20/2021   Hypoalbuminemia due to protein-calorie malnutrition ([place]) 11/20/2021   Anemia 11/20/2021   Acute respiratory failure with hypoxemia (HCC)    Benzodiazepine withdrawal without complication (HCC)    Malnutrition of moderate degree 10/20/2021   Absent pedal pulses 10/19/2021   Chronic hip pain (Left) 02/17/2021   Hip pain, acute (Left) 02/17/2021   Chronic use of opiate for therapeutic purpose 02/16/2021   Closed fracture of hip, sequela (Left) 02/07/2021   Fracture of femoral neck, left, closed ([place]) 01/11/2021   Chronic respiratory failure with hypoxia ([place]) 01/11/2021   Hypokalemia 01/11/2021   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 ([place]) 12/22/2020   PAD (peripheral artery disease) ([place]) 12/22/2020   Encounter for long-term opiate analgesic use 11/11/2020   Dry gangrene ([place]) of middle toe, right foot 11/11/2020   Foot pain, right 11/11/2020   Hard of hearing 11/11/2020   Atherosclerosis of native arteries of the extremities  with gangrene ([place]) 12/09/2019   Vitamin D deficiency 11/12/2019   Pharmacologic therapy 06/04/2019   Disorder of skeletal system 06/04/2019   Problems influencing health status 06/04/2019   Compression fracture of L3 vertebra (HCC) 08/19/2018   Chronic hip pain (Bilateral) 12/31/2017   Neurogenic pain 08/29/2017   Chronic low back pain (1ry area of Pain) (Right) w/o sciatica 08/29/2017   Chronic sacroiliac joint pain (Right) 06/05/2017   Vitamin D insufficiency 03/12/2017   Right hip pain 02/20/2017   Intertrochanteric fracture of right hip,  sequela 02/20/2017   B12 deficiency 02/05/2017   Pressure injury of skin 10/02/2016   Closed displaced intertrochanteric fracture of right femur ([place]) 10/02/2016   Hip fracture ([place]) 10/01/2016   Cervical facet hypertrophy (Bilateral) 05/27/2016   History of shoulder surgery 5 (Right) 05/27/2016   Lumbar foraminal stenosis (L3-4) (Left) 05/27/2016   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) 05/27/2016   Lumbar facet hypertrophy (Bilateral) 05/27/2016   Lumbar facet syndrome (Right) 05/27/2016   Lumbar grade 1 Anterolisthesis of L3 over L4 05/27/2016   Chronic shoulder pain (Bilateral) (status post multiple surgeries) (R>L) 12/08/2015   Substance use disorder Risk: High 09/14/2015   Chronic pain syndrome 09/14/2015   Cervical spondylosis 09/14/2015   Chronic neck pain (2ry area of Pain) (Bilateral) (R>L) 09/14/2015   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) 09/14/2015   Cervical facet syndrome (Bilateral) (R>L) 09/14/2015   Cervical myofascial pain syndrome 09/14/2015   Lumbar spondylosis 09/14/2015   Chronic shoulder impingement syndrome (Right) 09/14/2015   Hypomagnesemia 09/14/2015   CRP elevated 09/14/2015   Elevated sedimentation rate 09/14/2015   Chronic obstructive pulmonary disease (COPD) ([place]) 09/14/2015   Nicotine dependence 09/14/2015   Chronic shoulder pain (Right) 09/14/2015   Abnormal nerve conduction studies 09/14/2015   Encounter for therapeutic drug level monitoring 09/09/2015   Long term current use of opiate analgesic 09/09/2015   Long term prescription opiate use [id number]   Uncomplicated opioid dependence ([place]) 09/09/2015   Opiate use 09/09/2015   Anxiety and depression 07/11/2012   Tobacco use disorder 05/27/2011   Fatigue 05/27/2011   Hypothyroidism 11/25/2010   HLD (hyperlipidemia) 08/24/2010   Tachycardia 08/24/2010   DYSPNEA 08/24/2010     Past Surgical History   Past Surgical History:  Procedure Laterality  Date   AMPUTATION TOE Right 12/26/2020   Procedure: AMPUTATION TOE-3rd Toes;  Surgeon: Samara Deist, DPM;  Location: [facility];  Service: Podiatry;  Laterality: Right;   AMPUTATION TOE Right 04/22/2021   Procedure: AMPUTATION TOE- RIGHT 2ND;  Surgeon: Samara Deist, DPM;  Location: [facility];  Service: Podiatry;  Laterality: Right;   AMPUTATION TOE Right 10/21/2021   Procedure: AMPUTATION TOE;  Surgeon: Caroline More, DPM;  Location: [facility];  Service: Podiatry;  Laterality: Right;   AMPUTATION TOE Right 11/23/2021   Procedure: AMPUTATION TOE;  Surgeon: Sharlotte Alamo, DPM;  Location: [facility];  Service: Podiatry;  Laterality: Right;   AMPUTATION TOE Right 07/05/2022   Procedure: AMPUTATION RIGHT 5TH TOE;  Surgeon: [name], DPM;  Location: [facility];  Service: Podiatry;  Laterality: Right;   APPENDECTOMY     BACK SURGERY     CERVICAL FUSION   CARPAL TUNNEL RELEASE     CATARACT EXTRACTION W/PHACO Right 08/07/2017   Procedure: CATARACT EXTRACTION PHACO AND INTRAOCULAR LENS PLACEMENT ([place]);  Surgeon: Birder Robson, MD;  Location: [facility];  Service: Ophthalmology;  Laterality: Right;  [place] 00:52.0 AP% 16.8 CDE 8.74 Fluid Pack Lot #  [id number]   CATARACT EXTRACTION W/PHACO Left 09/04/2017   Procedure: CATARACT EXTRACTION PHACO AND INTRAOCULAR LENS PLACEMENT (IOC);  Surgeon: Birder Robson, MD;  Location: [facility];  Service: Ophthalmology;  Laterality: Left;  [place] 00:34 AP% 17.0 CDE 5.80 Fluid pack lot # [id number]   CESAREAN SECTION     CHOLECYSTECTOMY     FRACTURE SURGERY     HIP SURGERY     INTRAMEDULLARY (IM) NAIL INTERTROCHANTERIC Right 10/01/2016   Procedure: INTRAMEDULLARY (IM) NAIL INTERTROCHANTRIC;  Surgeon: Corky Mull, MD;  Location: [facility];  Service: Orthopedics;  Laterality: Right;   INTRAMEDULLARY (IM) NAIL INTERTROCHANTERIC Left 01/14/2021   Procedure: INTRAMEDULLARY (IM) NAIL INTERTROCHANTRIC;  Surgeon: Lovell Sheehan, MD;  Location: [facility];  Service: Orthopedics;   Laterality: Left;   KYPHOPLASTY N/A 08/20/2018   Procedure: [id number];  Surgeon: Hessie Knows, MD;  Location: [facility];  Service: Orthopedics;  Laterality: N/A;   LOWER EXTREMITY ANGIOGRAPHY Right 12/18/2019   Procedure: LOWER EXTREMITY ANGIOGRAPHY;  Surgeon: Algernon Huxley, MD;  Location: [place] [facility];  Service: Cardiovascular;  Laterality: Right;   LOWER EXTREMITY ANGIOGRAPHY Right 12/02/2020   Procedure: LOWER EXTREMITY ANGIOGRAPHY;  Surgeon: Algernon Huxley, MD;  Location: [place] [facility];  Service: Cardiovascular;  Laterality: Right;   LOWER EXTREMITY ANGIOGRAPHY Right 12/23/2020   Procedure: Lower Extremity Angiography;  Surgeon: Algernon Huxley, MD;  Location: [place] [facility];  Service: Cardiovascular;  Laterality: Right;   LOWER EXTREMITY ANGIOGRAPHY Right 12/24/2020   Procedure: Lower Extremity Angiography;  Surgeon: Algernon Huxley, MD;  Location: [place] [facility];  Service: Cardiovascular;  Laterality: Right;   LOWER EXTREMITY ANGIOGRAPHY Right 10/20/2021   Procedure: Lower Extremity Angiography;  Surgeon: Algernon Huxley, MD;  Location: [place] [facility];  Service: Cardiovascular;  Laterality: Right;   LOWER EXTREMITY ANGIOGRAPHY Right 07/03/2022   Procedure: Lower Extremity Angiography;  Surgeon: Algernon Huxley, MD;  Location: [place] [facility];  Service: Cardiovascular;  Laterality: Right;   NECK SURGERY     NOSE SURGERY     ROTATOR CUFF REPAIR     x2   SHOULDER ARTHROSCOPY  12/07/2011   Procedure: ARTHROSCOPY SHOULDER;  Surgeon: Ninetta Lights, MD;  Location: [place] [facility];  Service: Orthopedics;  Laterality: Right;  Debridement Partial Cuff Tear, Release Coracoacromial Ligament   SHOULDER SURGERY  12/07/2011   right     Home Medications   Prior to Admission medications   Medication Sig Start Date End Date Taking? Authorizing Provider  albuterol (VENTOLIN HFA) 108 (90 Base) MCG/ACT inhaler Inhale 1-2 puffs into the lungs every 4 (four) hours as needed  for shortness of breath or wheezing. 06/28/22   [provider]  ALPRAZolam Duanne Moron) 1 MG tablet Take 1 mg by mouth 3 (three) times daily as needed. 10/06/21   [provider]  amLODipine (NORVASC) 5 MG tablet Take 1 tablet (5 mg total) by mouth daily. 11/10/22   Lorella Nimrod, MD  ammonium lactate (LAC-HYDRIN) 12 % lotion APPLY TO AFFECTED AREA AS NEEDED FOR DRY SKIN 01/02/23   Felipa Furnace, DPM  atorvastatin (LIPITOR) 10 MG tablet Take 1 tablet (10 mg total) by mouth daily. 11/10/22 11/10/23  Lorella Nimrod, MD  COMBIVENT RESPIMAT 20-100 MCG/ACT AERS respimat Inhale 1-2 puffs into the lungs 4 (four) times daily. 11/25/21   Loletha Grayer, MD  levothyroxine (SYNTHROID) 50 MCG tablet Take 50 mcg by mouth every morning. 08/09/22   [provider]  metoprolol tartrate (  LOPRESSOR) 50 MG tablet Take 1 tablet (50 mg total) by mouth 2 (two) times daily. 11/10/22 02/08/23  Lorella Nimrod, MD  Multiple Vitamin (MULTIVITAMIN WITH MINERALS) TABS tablet Take 1 tablet by mouth daily. 11/10/22   Lorella Nimrod, MD  nutrition supplement, JUVEN, (JUVEN) PACK Take 1 packet by mouth 2 (two) times daily between meals. Patient not taking: Reported on 06/29/2022 11/25/21   Loletha Grayer, MD  polyethylene glycol (MIRALAX / GLYCOLAX) 17 g packet Take 17 g by mouth daily. Patient not taking: Reported on 06/29/2022 11/25/21   Loletha Grayer, MD  senna-docusate (SENOKOT-S) 8.6-50 MG tablet Take 2 tablets by mouth at bedtime. Patient not taking: Reported on 06/29/2022 11/25/21   Loletha Grayer, MD  thiamine (VITAMIN B-1) 100 MG tablet Take 1 tablet (100 mg total) by mouth daily. 11/10/22   Lorella Nimrod, MD  traZODone (DESYREL) 150 MG tablet Take 300 mg by mouth at bedtime as needed. 09/22/21   [provider]     Allergies  Patient has no known allergies.   Family History   Family History  Problem Relation Age of Onset   Aneurysm Other      Physical Exam  Triage Vital Signs: ED  Triage Vitals [01/10/23 2307]  Enc Vitals Group     BP      Pulse      Resp      Temp      Temp src      SpO2      Weight 100 lb (45.4 kg)     Height 5' 4"$  (1.626 m)     Head Circumference      Peak Flow      Pain Score 0     Pain Loc      Pain Edu?      Excl. in [place]?     Updated Vital Signs: BP (!) 146/86   Pulse (!) 124   Temp 98.5 F (36.9 C) (Oral)   Resp (!) 40   Ht 5' 4"$  (1.626 m)   Wt 45.4 kg   SpO2 100%   BMI 17.16 kg/m    General: Awake, moderate distress.  CV:  Tachycardic.  Good peripheral perfusion.  Resp:  Increased effort.  Diminished, scattered wheezing. Abd:  Nontender.  No distention.  Other:  Bilateral calves are supple and nontender.   ED Results / Procedures / Treatments  Labs (all labs ordered are listed, but only abnormal results are displayed) Labs Reviewed  CULTURE, BLOOD (ROUTINE X 2)  CULTURE, BLOOD (ROUTINE X 2)  RESP PANEL BY RT-PCR (RSV, FLU A&B, COVID)  [id number]  LACTIC ACID, PLASMA  LACTIC ACID, PLASMA  CBC WITH DIFFERENTIAL/PLATELET  COMPREHENSIVE METABOLIC PANEL  BRAIN NATRIURETIC PEPTIDE  BLOOD GAS, ARTERIAL  MAGNESIUM  TROPONIN I (HIGH SENSITIVITY)     EKG  ED ECG REPORT I, Legacie Dillingham J, the attending physician, personally viewed and interpreted this ECG.   Date: 01/10/2023  EKG Time: 2318  Rate: 123  Rhythm: sinus tachycardia  Axis: Normal  Intervals:none  ST&T Change: Nonspecific    RADIOLOGY I have independently visualized and interpreted patient's chest x-ray as well as noted the radiology interpretation:  X-ray:  Official radiology report(s): No results found.   PROCEDURES:  Critical Care performed: Yes, see critical care procedure note(s)  CRITICAL CARE Performed by: Paulette Blanch   Total critical care time: *** minutes  Critical care time was exclusive of separately billable procedures and treating other patients.  Critical care was necessary to  treat or prevent imminent or life-threatening  deterioration.  Critical care was time spent personally by me on the following activities: development of treatment plan with patient and/or surrogate as well as nursing, discussions with consultants, evaluation of patient's response to treatment, examination of patient, obtaining history from patient or surrogate, ordering and performing treatments and interventions, ordering and review of laboratory studies, ordering and review of radiographic studies, pulse oximetry and re-evaluation of patient's condition.   Marland Kitchen1-3 Lead EKG Interpretation  Performed by: Paulette Blanch, MD Authorized by: Paulette Blanch, MD     Interpretation: abnormal     ECG rate:  123   ECG rate assessment: tachycardic     Rhythm: sinus tachycardia     Ectopy: none     Conduction: normal   Comments:     Patient placed on cardiac monitor to evaluate for arrhythmias    MEDICATIONS ORDERED IN ED: Medications  magnesium sulfate IVPB 2 g 50 mL (has no administration in time range)  ipratropium-albuterol (DUONEB) 0.5-2.5 (3) MG/3ML nebulizer solution 3 mL (has no administration in time range)     IMPRESSION / MDM / ASSESSMENT AND PLAN / ED COURSE  I reviewed the triage vital signs and the nursing notes.                             77 year old female presenting with shortness of breath and hypoxia. Differential includes, but is not limited to, viral syndrome, bronchitis including COPD exacerbation, pneumonia, reactive airway disease including asthma, CHF including exacerbation with or without pulmonary/interstitial edema, pneumothorax, ACS, thoracic trauma, and pulmonary embolism.  I personally reviewed patient's records and note a recent initial pulmonology visit on 12/25/2022.  Patient's presentation is most consistent with acute presentation with potential threat to life or bodily function.  The patient is on the cardiac monitor to evaluate for evidence of arrhythmia and/or significant heart rate changes.  EMS reports  axillary temperature 99.1 F.  Will obtain sepsis protocol workup, chest x-ray, respiratory panel.  Placed on BiPAP, administer 2 g IV magnesium and DuoNeb.  Anticipate hospitalization.      FINAL CLINICAL IMPRESSION(S) / ED DIAGNOSES   Final diagnoses:  COPD exacerbation ([place])  Acute respiratory failure with hypoxia ([place])     Rx / DC Orders   ED Discharge Orders     None        Note:  This document was prepared using Dragon voice recognition software and may include unintentional dictation errors.

## 2023-01-10 NOTE — ED Provider Notes (Incomplete)
[facility] Provider Note    Event Date/Time   First MD Initiated Contact with Patient 01/10/23 2308     (approximate)   History   Respiratory Distress   HPI  [name] is a 77 y.o. female brought to the ED via EMS from home with a chief complaint of respiratory distress.  Patient with a history of COPD on baseline 3 L nasal cannula oxygen.  Endorses increased shortness of breath for 3 to 4 days.  Saturations on patient's baseline oxygen were 80% per EMS.  Given 2 albuterol nebulizer treatments, 1 DuoNeb and 125 mg IV Solu-Medrol en route to the ED.  Endorses chest tightness.  Denies fever/chills, cough, abdominal pain, nausea, vomiting or dizziness.     Past Medical History   Past Medical History:  Diagnosis Date  . Anxiety   . CAD (coronary artery disease)   . Carpal tunnel syndrome   . Cataract   . Complication of anesthesia    has woken up at times  . COPD (chronic obstructive pulmonary disease) ([place])   . CRP elevated 09/14/2015  . Depression   . Difficult intubation   . Dyslipidemia   . Dyspnea    DOE  . Dysrhythmia    hx palpatations  . Elevated sedimentation rate 09/14/2015  . Esophageal spasm   . Gastrointestinal parasites   . GERD (gastroesophageal reflux disease)   . HCAP (healthcare-associated pneumonia) 10/22/2018  . Hiatal hernia   . History of peptic ulcer disease   . Hypertension   . Hyperthyroidism   . Hypothyroidism   . Low magnesium levels 09/14/2015  . Pelvic fracture ([place]) 2008   fall from riding a horse  . Peripheral vascular disease ([place])   . Reflux   . Rotator cuff injury   . Sepsis ([place]) 10/22/2018  . Stenosis, spinal, lumbar      Active Problem List   Patient Active Problem List   Diagnosis Date Noted  . AMS (altered mental status) 11/08/2022  . Underweight 11/08/2022  . Acute on chronic respiratory failure with hypoxia ([place]) 06/30/2022  . Osteomyelitis of fifth toe of right foot ([place]) 06/30/2022   . Dyslipidemia 06/30/2022  . Subacute osteomyelitis of right foot ([place])   . Essential hypertension   . Protein-calorie malnutrition, severe 11/21/2021  . COPD with acute exacerbation ([place]) 11/21/2021  . COPD exacerbation ([place]) 11/20/2021  . Chronic pain - multiple sites arthritis 11/20/2021  . Benzodiazepine dependence, continuous ([place]) 11/20/2021  . Peripheral vascular disease ([place]) 11/20/2021  . Hyponatremia 11/20/2021  . Hypocalcemia 11/20/2021  . Hypoalbuminemia due to protein-calorie malnutrition ([place]) 11/20/2021  . Anemia 11/20/2021  . Acute respiratory failure with hypoxemia ([place])   . Benzodiazepine withdrawal without complication ([place])   . Malnutrition of moderate degree 10/20/2021  . Absent pedal pulses 10/19/2021  . Chronic hip pain (Left) 02/17/2021  . Hip pain, acute (Left) 02/17/2021  . Chronic use of opiate for therapeutic purpose 02/16/2021  . Closed fracture of hip, sequela (Left) 02/07/2021  . Fracture of femoral neck, left, closed ([place]) 01/11/2021  . Chronic respiratory failure with hypoxia ([place]) 01/11/2021  . Hypokalemia 01/11/2021  . 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 ([place]) 12/22/2020  . PAD (peripheral artery disease) ([place]) 12/22/2020  . Encounter for long-term opiate analgesic use 11/11/2020  . Dry gangrene ([place]) of middle toe, right foot 11/11/2020  . Foot pain, right 11/11/2020  . Hard of hearing 11/11/2020  . Atherosclerosis of native arteries of the extremities  with gangrene ([place]) 12/09/2019  . Vitamin D deficiency 11/12/2019  . Pharmacologic therapy 06/04/2019  . Disorder of skeletal system 06/04/2019  . Problems influencing health status 06/04/2019  . Compression fracture of L3 vertebra ([place]) 08/19/2018  . Chronic hip pain (Bilateral) 12/31/2017  . Neurogenic pain 08/29/2017  . Chronic low back pain (1ry area of Pain) (Right) w/o sciatica 08/29/2017  . Chronic sacroiliac joint pain (Right) 06/05/2017  . Vitamin D insufficiency 03/12/2017  .  Right hip pain 02/20/2017  . Intertrochanteric fracture of right hip, sequela 02/20/2017  . B12 deficiency 02/05/2017  . Pressure injury of skin 10/02/2016  . Closed displaced intertrochanteric fracture of right femur ([place]) 10/02/2016  . Hip fracture ([place]) 10/01/2016  . Cervical facet hypertrophy (Bilateral) 05/27/2016  . History of shoulder surgery 5 (Right) 05/27/2016  . Lumbar foraminal stenosis (L3-4) (Left) 05/27/2016  . 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 Lumbar facet hypertrophy (Bilateral) 05/27/2016  . Lumbar facet syndrome (Right) 05/27/2016  . Lumbar grade 1 Anterolisthesis of L3 over L4 05/27/2016  . Chronic shoulder pain (Bilateral) (status post multiple surgeries) (R>L) 12/08/2015  . Substance use disorder Risk: High 09/14/2015  . Chronic pain syndrome 09/14/2015  . Cervical spondylosis 09/14/2015  . Chronic neck pain (2ry area of Pain) (Bilateral) (R>L) 09/14/2015  . 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 Cervical facet syndrome (Bilateral) (R>L) 09/14/2015  . Cervical myofascial pain syndrome 09/14/2015  . Lumbar spondylosis 09/14/2015  . Chronic shoulder impingement syndrome (Right) 09/14/2015  . Hypomagnesemia 09/14/2015  . CRP elevated 09/14/2015  . Elevated sedimentation rate 09/14/2015  . Chronic obstructive pulmonary disease (COPD) ([place]) 09/14/2015  . Nicotine dependence 09/14/2015  . Chronic shoulder pain (Right) 09/14/2015  . Abnormal nerve conduction studies 09/14/2015  . Encounter for therapeutic drug level monitoring 09/09/2015  . Long term current use of opiate analgesic 09/09/2015  . Long term prescription opiate use 09/09/2015  . Uncomplicated opioid dependence ([place]) 09/09/2015  . Opiate use 09/09/2015  . Anxiety and depression 07/11/2012  . Tobacco use disorder 05/27/2011  . Fatigue 05/27/2011  . Hypothyroidism 11/25/2010  . HLD (hyperlipidemia) 08/24/2010  . Tachycardia  08/24/2010  . DYSPNEA 08/24/2010     Past Surgical History   Past Surgical History:  Procedure Laterality Date  . AMPUTATION TOE Right 12/26/2020   Procedure: AMPUTATION TOE-3rd Toes;  Surgeon: Samara Deist, DPM;  Location: [facility];  Service: Podiatry;  Laterality: Right;  . AMPUTATION TOE Right 04/22/2021   Procedure: AMPUTATION TOE- RIGHT 2ND;  Surgeon: Samara Deist, DPM;  Location: [facility];  Service: Podiatry;  Laterality: Right;  . AMPUTATION TOE Right 10/21/2021   Procedure: AMPUTATION TOE;  Surgeon: Caroline More, DPM;  Location: [facility];  Service: Podiatry;  Laterality: Right;  . AMPUTATION TOE Right 11/23/2021   Procedure: AMPUTATION TOE;  Surgeon: Sharlotte Alamo, DPM;  Location: [facility];  Service: Podiatry;  Laterality: Right;  . AMPUTATION TOE Right 07/05/2022   Procedure: AMPUTATION RIGHT 5TH TOE;  Surgeon: [name], DPM;  Location: [facility];  Service: Podiatry;  Laterality: Right;  . APPENDECTOMY    . BACK SURGERY     CERVICAL FUSION  . CARPAL TUNNEL RELEASE    . CATARACT EXTRACTION W/PHACO Right 08/07/2017   Procedure: CATARACT EXTRACTION PHACO AND INTRAOCULAR LENS PLACEMENT (IOC);  Surgeon: Birder Robson, MD;  Location: [facility];  Service: Ophthalmology;  Laterality: Right;  [place] 00:52.0 AP% 16.8 CDE 8.74 Fluid Pack Lot #  [id number]  . CATARACT EXTRACTION W/PHACO Left 09/04/2017   Procedure: CATARACT EXTRACTION PHACO AND INTRAOCULAR LENS PLACEMENT (IOC);  Surgeon: Birder Robson, MD;  Location: [facility];  Service: Ophthalmology;  Laterality: Left;  [place] 00:34 AP% 17.0 CDE 5.80 Fluid pack lot # [id number]  . CESAREAN SECTION    . CHOLECYSTECTOMY    . FRACTURE SURGERY    . HIP SURGERY    . INTRAMEDULLARY (IM) NAIL INTERTROCHANTERIC Right 10/01/2016   Procedure: INTRAMEDULLARY (IM) NAIL INTERTROCHANTRIC;  Surgeon: Corky Mull, MD;  Location: [facility];  Service: Orthopedics;  Laterality: Right;  . INTRAMEDULLARY (IM) NAIL INTERTROCHANTERIC Left 01/14/2021    Procedure: INTRAMEDULLARY (IM) NAIL INTERTROCHANTRIC;  Surgeon: Lovell Sheehan, MD;  Location: [facility];  Service: Orthopedics;  Laterality: Left;  . KYPHOPLASTY N/A 08/20/2018   Procedure: [id number];  Surgeon: Hessie Knows, MD;  Location: [facility];  Service: Orthopedics;  Laterality: N/A;  . LOWER EXTREMITY ANGIOGRAPHY Right 12/18/2019   Procedure: LOWER EXTREMITY ANGIOGRAPHY;  Surgeon: Algernon Huxley, MD;  Location: [place] [facility];  Service: Cardiovascular;  Laterality: Right;  . LOWER EXTREMITY ANGIOGRAPHY Right 12/02/2020   Procedure: LOWER EXTREMITY ANGIOGRAPHY;  Surgeon: Algernon Huxley, MD;  Location: [place] [facility];  Service: Cardiovascular;  Laterality: Right;  . LOWER EXTREMITY ANGIOGRAPHY Right 12/23/2020   Procedure: Lower Extremity Angiography;  Surgeon: Algernon Huxley, MD;  Location: [place] [facility];  Service: Cardiovascular;  Laterality: Right;  . LOWER EXTREMITY ANGIOGRAPHY Right 12/24/2020   Procedure: Lower Extremity Angiography;  Surgeon: Algernon Huxley, MD;  Location: [place] [facility];  Service: Cardiovascular;  Laterality: Right;  . LOWER EXTREMITY ANGIOGRAPHY Right 10/20/2021   Procedure: Lower Extremity Angiography;  Surgeon: Algernon Huxley, MD;  Location: [place] [facility];  Service: Cardiovascular;  Laterality: Right;  . LOWER EXTREMITY ANGIOGRAPHY Right 07/03/2022   Procedure: Lower Extremity Angiography;  Surgeon: Algernon Huxley, MD;  Location: [place] [facility];  Service: Cardiovascular;  Laterality: Right;  . NECK SURGERY    . NOSE SURGERY    . ROTATOR CUFF REPAIR     x2  . SHOULDER ARTHROSCOPY  12/07/2011   Procedure: ARTHROSCOPY SHOULDER;  Surgeon: Ninetta Lights, MD;  Location: [place];  Service: Orthopedics;  Laterality: Right;  Debridement Partial Cuff Tear, Release Coracoacromial Ligament  . SHOULDER SURGERY  12/07/2011   right     Home Medications   Prior to Admission medications   Medication Sig Start Date End Date Taking?  Authorizing Provider  albuterol (VENTOLIN HFA) 108 (90 Base) MCG/ACT inhaler Inhale 1-2 puffs into the lungs every 4 (four) hours as needed for shortness of breath or wheezing. 06/28/22   [provider]  ALPRAZolam Duanne Moron) 1 MG tablet Take 1 mg by mouth 3 (three) times daily as needed. 10/06/21   [provider]  amLODipine (NORVASC) 5 MG tablet Take 1 tablet (5 mg total) by mouth daily. 11/10/22   Lorella Nimrod, MD  ammonium lactate (LAC-HYDRIN) 12 % lotion APPLY TO AFFECTED AREA AS NEEDED FOR DRY SKIN 01/02/23   Felipa Furnace, DPM  atorvastatin (LIPITOR) 10 MG tablet Take 1 tablet (10 mg total) by mouth daily. 11/10/22 11/10/23  Lorella Nimrod, MD  COMBIVENT RESPIMAT 20-100 MCG/ACT AERS respimat Inhale 1-2 puffs into the lungs 4 (four) times daily. 11/25/21   Loletha Grayer, MD  levothyroxine (SYNTHROID) 50 MCG tablet Take 50 mcg by mouth every morning. 08/09/22   [provider]  metoprolol tartrate (  LOPRESSOR) 50 MG tablet Take 1 tablet (50 mg total) by mouth 2 (two) times daily. 11/10/22 02/08/23  Lorella Nimrod, MD  Multiple Vitamin (MULTIVITAMIN WITH MINERALS) TABS tablet Take 1 tablet by mouth daily. 11/10/22   Lorella Nimrod, MD  nutrition supplement, JUVEN, (JUVEN) PACK Take 1 packet by mouth 2 (two) times daily between meals. Patient not taking: Reported on 06/29/2022 11/25/21   Loletha Grayer, MD  polyethylene glycol (MIRALAX / GLYCOLAX) 17 g packet Take 17 g by mouth daily. Patient not taking: Reported on 06/29/2022 11/25/21   Loletha Grayer, MD  senna-docusate (SENOKOT-S) 8.6-50 MG tablet Take 2 tablets by mouth at bedtime. Patient not taking: Reported on 06/29/2022 11/25/21   Loletha Grayer, MD  thiamine (VITAMIN B-1) 100 MG tablet Take 1 tablet (100 mg total) by mouth daily. 11/10/22   Lorella Nimrod, MD  traZODone (DESYREL) 150 MG tablet Take 300 mg by mouth at bedtime as needed. 09/22/21   [provider]     Allergies  Patient has no known  allergies.   Family History   Family History  Problem Relation Age of Onset  . Aneurysm Other      Physical Exam  Triage Vital Signs: ED Triage Vitals [01/10/23 2307]  Enc Vitals Group     BP      Pulse      Resp      Temp      Temp src      SpO2      Weight 100 lb (45.4 kg)     Height 5' 4"$  (1.626 m)     Head Circumference      Peak Flow      Pain Score 0     Pain Loc      Pain Edu?      Excl. in [place]?     Updated Vital Signs: BP (!) 146/86   Pulse (!) 124   Temp 98.5 F (36.9 C) (Oral)   Resp (!) 40   Ht 5' 4"$  (1.626 m)   Wt 45.4 kg   SpO2 100%   BMI 17.16 kg/m    General: Awake, moderate distress.  CV:  Tachycardic.  Good peripheral perfusion.  Resp:  Increased effort.  Diminished, scattered wheezing. Abd:  Nontender.  No distention.  Other:  Bilateral calves are supple and nontender.   ED Results / Procedures / Treatments  Labs (all labs ordered are listed, but only abnormal results are displayed) Labs Reviewed  CBC WITH DIFFERENTIAL/PLATELET - Abnormal; Notable for the following components:      Result Value   Hemoglobin 11.2 (*)    MCHC 29.2 (*)    All other components within normal limits  COMPREHENSIVE METABOLIC PANEL - Abnormal; Notable for the following components:   Potassium 2.9 (*)    CO2 36 (*)    Glucose, Bld 110 (*)    Calcium 8.5 (*)    All other components within normal limits  BLOOD GAS, ARTERIAL - Abnormal; Notable for the following components:   pCO2 arterial 80 (*)    Bicarbonate 45.2 (*)    Acid-Base Excess 15.6 (*)    All other components within normal limits  CULTURE, BLOOD (ROUTINE X 2)  CULTURE, BLOOD (ROUTINE X 2)  RESP PANEL BY RT-PCR (RSV, FLU A&B, COVID)  [id number]  LACTIC ACID, PLASMA  MAGNESIUM  BRAIN NATRIURETIC PEPTIDE  TROPONIN I (HIGH SENSITIVITY)     EKG  ED ECG REPORT I, Myia Bergh J, the attending physician, personally viewed  and interpreted this ECG.   Date: 01/10/2023  EKG Time: 2318  Rate:  123  Rhythm: sinus tachycardia  Axis: Normal  Intervals:none  ST&T Change: Nonspecific    RADIOLOGY I have independently visualized and interpreted patient's chest x-ray as well as noted the radiology interpretation:  X-ray:  Official radiology report(s): No results found.   PROCEDURES:  Critical Care performed: Yes, see critical care procedure note(s)  CRITICAL CARE Performed by: Paulette Blanch   Total critical care time: *** minutes  Critical care time was exclusive of separately billable procedures and treating other patients.  Critical care was necessary to treat or prevent imminent or life-threatening deterioration.  Critical care was time spent personally by me on the following activities: development of treatment plan with patient and/or surrogate as well as nursing, discussions with consultants, evaluation of patient's response to treatment, examination of patient, obtaining history from patient or surrogate, ordering and performing treatments and interventions, ordering and review of laboratory studies, ordering and review of radiographic studies, pulse oximetry and re-evaluation of patient's condition.   Marland Kitchen1-3 Lead EKG Interpretation  Performed by: Paulette Blanch, MD Authorized by: Paulette Blanch, MD     Interpretation: abnormal     ECG rate:  123   ECG rate assessment: tachycardic     Rhythm: sinus tachycardia     Ectopy: none     Conduction: normal   Comments:     Patient placed on cardiac monitor to evaluate for arrhythmias    MEDICATIONS ORDERED IN ED: Medications  magnesium sulfate IVPB 2 g 50 mL (2 g Intravenous New Bag/Given 01/10/23 2325)  ipratropium-albuterol (DUONEB) 0.5-2.5 (3) MG/3ML nebulizer solution 3 mL (3 mLs Nebulization Given 01/10/23 2326)     IMPRESSION / MDM / ASSESSMENT AND PLAN / ED COURSE  I reviewed the triage vital signs and the nursing notes.                             77 year old female presenting with shortness of breath and  hypoxia. Differential includes, but is not limited to, viral syndrome, bronchitis including COPD exacerbation, pneumonia, reactive airway disease including asthma, CHF including exacerbation with or without pulmonary/interstitial edema, pneumothorax, ACS, thoracic trauma, and pulmonary embolism.  I personally reviewed patient's records and note a recent initial pulmonology visit on 12/25/2022.  Patient's presentation is most consistent with acute presentation with potential threat to life or bodily function.  The patient is on the cardiac monitor to evaluate for evidence of arrhythmia and/or significant heart rate changes.  EMS reports axillary temperature 99.1 F.  Will obtain sepsis protocol workup, chest x-ray, respiratory panel.  Placed on BiPAP, administer 2 g IV magnesium and DuoNeb.  Anticipate hospitalization.  Clinical Course as of 01/10/23 2352  Wed Jan 10, 2023  2351 pCO2 80; patient appears more comfortable on BiPAP [JS]    Clinical Course User Index [JS] Paulette Blanch, MD     FINAL CLINICAL IMPRESSION(S) / ED DIAGNOSES   Final diagnoses:  COPD exacerbation ([place])  Acute respiratory failure with hypoxia ([place])     Rx / DC Orders   ED Discharge Orders     None        Note:  This document was prepared using Dragon voice recognition software and may include unintentional dictation errors.

## 2023-01-10 NOTE — ED Triage Notes (Signed)
Patient arrived via EMS from home for respiratory distress. Reports shortness of breath x 3-4 days. Hx COPD, oxygen saturation 80's on 3L per [place] on arrival. EMS state they were unable to auscultate lung sounds. Accessory muscle use and only able to state 1-2 words on EMS arrival. 18 G right FA. 125 mg Solumedrol IV. 2 albuterol and 1 duoneb given en route. Patient AOX4 on arrival with continued accessory muscle use.

## 2023-01-11 ENCOUNTER — Encounter: Payer: Self-pay | Admitting: Internal Medicine

## 2023-01-11 ENCOUNTER — Emergency Department: Payer: Medicare HMO

## 2023-01-11 DIAGNOSIS — K219 Gastro-esophageal reflux disease without esophagitis: Secondary | ICD-10-CM | POA: Diagnosis present

## 2023-01-11 DIAGNOSIS — Z7951 Long term (current) use of inhaled steroids: Secondary | ICD-10-CM | POA: Diagnosis not present

## 2023-01-11 DIAGNOSIS — F17210 Nicotine dependence, cigarettes, uncomplicated: Secondary | ICD-10-CM | POA: Diagnosis present

## 2023-01-11 DIAGNOSIS — J441 Chronic obstructive pulmonary disease with (acute) exacerbation: Secondary | ICD-10-CM | POA: Diagnosis present

## 2023-01-11 DIAGNOSIS — Z1152 Encounter for screening for COVID-19: Secondary | ICD-10-CM | POA: Diagnosis not present

## 2023-01-11 DIAGNOSIS — R451 Restlessness and agitation: Secondary | ICD-10-CM | POA: Diagnosis present

## 2023-01-11 DIAGNOSIS — E876 Hypokalemia: Secondary | ICD-10-CM | POA: Diagnosis present

## 2023-01-11 DIAGNOSIS — E43 Unspecified severe protein-calorie malnutrition: Secondary | ICD-10-CM | POA: Diagnosis present

## 2023-01-11 DIAGNOSIS — Z66 Do not resuscitate: Secondary | ICD-10-CM | POA: Diagnosis not present

## 2023-01-11 DIAGNOSIS — I739 Peripheral vascular disease, unspecified: Secondary | ICD-10-CM | POA: Diagnosis present

## 2023-01-11 DIAGNOSIS — Z79890 Hormone replacement therapy (postmenopausal): Secondary | ICD-10-CM | POA: Diagnosis not present

## 2023-01-11 DIAGNOSIS — J9621 Acute and chronic respiratory failure with hypoxia: Secondary | ICD-10-CM | POA: Diagnosis present

## 2023-01-11 DIAGNOSIS — F1090 Alcohol use, unspecified, uncomplicated: Secondary | ICD-10-CM | POA: Diagnosis present

## 2023-01-11 DIAGNOSIS — F40240 Claustrophobia: Secondary | ICD-10-CM | POA: Diagnosis present

## 2023-01-11 DIAGNOSIS — F1320 Sedative, hypnotic or anxiolytic dependence, uncomplicated: Secondary | ICD-10-CM | POA: Diagnosis present

## 2023-01-11 DIAGNOSIS — Z515 Encounter for palliative care: Secondary | ICD-10-CM | POA: Diagnosis not present

## 2023-01-11 DIAGNOSIS — Z79899 Other long term (current) drug therapy: Secondary | ICD-10-CM | POA: Diagnosis not present

## 2023-01-11 DIAGNOSIS — E039 Hypothyroidism, unspecified: Secondary | ICD-10-CM | POA: Diagnosis present

## 2023-01-11 DIAGNOSIS — I2510 Atherosclerotic heart disease of native coronary artery without angina pectoris: Secondary | ICD-10-CM | POA: Diagnosis present

## 2023-01-11 DIAGNOSIS — I10 Essential (primary) hypertension: Secondary | ICD-10-CM | POA: Diagnosis present

## 2023-01-11 DIAGNOSIS — E785 Hyperlipidemia, unspecified: Secondary | ICD-10-CM | POA: Diagnosis present

## 2023-01-11 DIAGNOSIS — J9622 Acute and chronic respiratory failure with hypercapnia: Secondary | ICD-10-CM | POA: Diagnosis present

## 2023-01-11 DIAGNOSIS — F32A Depression, unspecified: Secondary | ICD-10-CM | POA: Diagnosis present

## 2023-01-11 DIAGNOSIS — Z681 Body mass index (BMI) 19 or less, adult: Secondary | ICD-10-CM | POA: Diagnosis not present

## 2023-01-11 LAB — CBC
HCT: 37.3 % (ref 36.0–46.0)
Hemoglobin: 11 g/dL — ABNORMAL LOW (ref 12.0–15.0)
MCH: 27.4 pg (ref 26.0–34.0)
MCHC: 29.5 g/dL — ABNORMAL LOW (ref 30.0–36.0)
MCV: 92.8 fL (ref 80.0–100.0)
Platelets: 264 10*3/uL (ref 150–400)
RBC: 4.02 MIL/uL (ref 3.87–5.11)
RDW: 15.6 % — ABNORMAL HIGH (ref 11.5–15.5)
WBC: 5.1 10*3/uL (ref 4.0–10.5)
nRBC: 0 % (ref 0.0–0.2)

## 2023-01-11 LAB — RESP PANEL BY RT-PCR (RSV, FLU A&B, COVID)  RVPGX2
Influenza A by PCR: NEGATIVE
Influenza B by PCR: NEGATIVE
Resp Syncytial Virus by PCR: NEGATIVE
SARS Coronavirus 2 by RT PCR: NEGATIVE

## 2023-01-11 LAB — HIV ANTIBODY (ROUTINE TESTING W REFLEX): HIV Screen 4th Generation wRfx: NONREACTIVE

## 2023-01-11 LAB — TROPONIN I (HIGH SENSITIVITY): Troponin I (High Sensitivity): 13 ng/L (ref <18)

## 2023-01-11 LAB — CREATININE, SERUM
Creatinine, Ser: 0.58 mg/dL (ref 0.44–1.00)
GFR, Estimated: >60 mL/min (ref >60)

## 2023-01-11 LAB — BRAIN NATRIURETIC PEPTIDE: B Natriuretic Peptide: 70.6 pg/mL (ref 0.0–100.0)

## 2023-01-11 MED ORDER — AMLODIPINE BESYLATE 5 MG PO TABS
5.0000 mg | ORAL_TABLET | Freq: Every day | ORAL | Status: DC
  Administered 2023-01-11 – 2023-01-15 (×4): 5 mg via ORAL
  Filled 2023-01-11 (×4): qty 1

## 2023-01-11 MED ORDER — ALBUTEROL SULFATE HFA 108 (90 BASE) MCG/ACT IN AERS: 1.0000 | INHALATION_SPRAY | RESPIRATORY_TRACT | Status: DC | PRN

## 2023-01-11 MED ORDER — METHYLPREDNISOLONE SODIUM SUCC 40 MG IJ SOLR
40.0000 mg | Freq: Two times a day (BID) | INTRAMUSCULAR | Status: AC
  Administered 2023-01-11 (×2): 40 mg via INTRAVENOUS
  Filled 2023-01-11 (×3): qty 1

## 2023-01-11 MED ORDER — LEVOTHYROXINE SODIUM 50 MCG PO TABS
50.0000 ug | ORAL_TABLET | Freq: Every day | ORAL | Status: DC
  Administered 2023-01-11 – 2023-01-15 (×5): 50 ug via ORAL
  Filled 2023-01-11 (×5): qty 1

## 2023-01-11 MED ORDER — LEVOTHYROXINE SODIUM 50 MCG PO TABS: 50.0000 ug | ORAL_TABLET | Freq: Every morning | ORAL | Status: DC

## 2023-01-11 MED ORDER — ATORVASTATIN CALCIUM 10 MG PO TABS
10.0000 mg | ORAL_TABLET | Freq: Every day | ORAL | Status: DC
  Administered 2023-01-11 – 2023-01-15 (×4): 10 mg via ORAL
  Filled 2023-01-11 (×4): qty 1

## 2023-01-11 MED ORDER — LORAZEPAM 1 MG PO TABS
1.0000 mg | ORAL_TABLET | ORAL | Status: AC | PRN
  Administered 2023-01-12 (×3): 1 mg via ORAL
  Filled 2023-01-11 (×3): qty 1

## 2023-01-11 MED ORDER — SENNOSIDES-DOCUSATE SODIUM 8.6-50 MG PO TABS
2.0000 | ORAL_TABLET | Freq: Every day | ORAL | Status: DC
  Administered 2023-01-11 – 2023-01-14 (×4): 2 via ORAL
  Filled 2023-01-11 (×3): qty 2

## 2023-01-11 MED ORDER — POTASSIUM CHLORIDE 10 MEQ/100ML IV SOLN
10.0000 meq | Freq: Once | INTRAVENOUS | Status: AC
  Administered 2023-01-11: 10 meq via INTRAVENOUS
  Filled 2023-01-11: qty 100

## 2023-01-11 MED ORDER — ALBUTEROL SULFATE (2.5 MG/3ML) 0.083% IN NEBU: 2.5000 mg | INHALATION_SOLUTION | RESPIRATORY_TRACT | Status: DC | PRN

## 2023-01-11 MED ORDER — TRAZODONE HCL 100 MG PO TABS
300.0000 mg | ORAL_TABLET | Freq: Every evening | ORAL | Status: DC | PRN
  Administered 2023-01-13 – 2023-01-14 (×2): 300 mg via ORAL
  Filled 2023-01-11 (×2): qty 3

## 2023-01-11 MED ORDER — IPRATROPIUM-ALBUTEROL 20-100 MCG/ACT IN AERS: 1.0000 | INHALATION_SPRAY | Freq: Four times a day (QID) | RESPIRATORY_TRACT | Status: DC

## 2023-01-11 MED ORDER — POTASSIUM CHLORIDE IN NACL 40-0.9 MEQ/L-% IV SOLN
INTRAVENOUS | Status: DC
  Filled 2023-01-11 (×10): qty 1000

## 2023-01-11 MED ORDER — POLYETHYLENE GLYCOL 3350 17 G PO PACK
17.0000 g | PACK | Freq: Every day | ORAL | Status: DC
  Administered 2023-01-11 – 2023-01-15 (×4): 17 g via ORAL
  Filled 2023-01-11 (×4): qty 1

## 2023-01-11 MED ORDER — JUVEN PO PACK
1.0000 | PACK | Freq: Two times a day (BID) | ORAL | Status: DC
  Administered 2023-01-11: 1 via ORAL

## 2023-01-11 MED ORDER — NICOTINE 21 MG/24HR TD PT24
21.0000 mg | MEDICATED_PATCH | Freq: Every day | TRANSDERMAL | Status: DC
  Administered 2023-01-11 – 2023-01-15 (×4): 21 mg via TRANSDERMAL
  Filled 2023-01-11 (×4): qty 1

## 2023-01-11 MED ORDER — ADULT MULTIVITAMIN W/MINERALS CH
1.0000 | ORAL_TABLET | Freq: Every day | ORAL | Status: DC
  Administered 2023-01-11 – 2023-01-15 (×4): 1 via ORAL
  Filled 2023-01-11 (×4): qty 1

## 2023-01-11 MED ORDER — SODIUM CHLORIDE 0.9 % IV SOLN
1.0000 g | INTRAVENOUS | Status: AC
  Administered 2023-01-11 – 2023-01-15 (×5): 1 g via INTRAVENOUS
  Filled 2023-01-11 (×5): qty 10

## 2023-01-11 MED ORDER — ALPRAZOLAM 0.5 MG PO TABS
1.0000 mg | ORAL_TABLET | Freq: Three times a day (TID) | ORAL | Status: DC | PRN
  Administered 2023-01-11: 1 mg via ORAL
  Filled 2023-01-11: qty 2

## 2023-01-11 MED ORDER — LORAZEPAM 2 MG/ML IJ SOLN
2.0000 mg | Freq: Once | INTRAMUSCULAR | Status: AC
  Administered 2023-01-11: 2 mg via INTRAVENOUS
  Filled 2023-01-11: qty 1

## 2023-01-11 MED ORDER — THIAMINE MONONITRATE 100 MG PO TABS
100.0000 mg | ORAL_TABLET | Freq: Every day | ORAL | Status: DC
  Administered 2023-01-11 – 2023-01-15 (×4): 100 mg via ORAL
  Filled 2023-01-11 (×4): qty 1

## 2023-01-11 MED ORDER — METOPROLOL TARTRATE 50 MG PO TABS
50.0000 mg | ORAL_TABLET | Freq: Two times a day (BID) | ORAL | Status: DC
  Administered 2023-01-11 – 2023-01-15 (×8): 50 mg via ORAL
  Filled 2023-01-11: qty 1
  Filled 2023-01-11: qty 2
  Filled 2023-01-11 (×6): qty 1

## 2023-01-11 MED ORDER — IPRATROPIUM-ALBUTEROL 0.5-2.5 (3) MG/3ML IN SOLN
3.0000 mL | Freq: Four times a day (QID) | RESPIRATORY_TRACT | Status: DC
  Administered 2023-01-11 – 2023-01-14 (×13): 3 mL via RESPIRATORY_TRACT
  Filled 2023-01-11 (×13): qty 3

## 2023-01-11 MED ORDER — FOLIC ACID 1 MG PO TABS
1.0000 mg | ORAL_TABLET | Freq: Every day | ORAL | Status: DC
  Administered 2023-01-11 – 2023-01-15 (×4): 1 mg via ORAL
  Filled 2023-01-11 (×4): qty 1

## 2023-01-11 MED ORDER — ENOXAPARIN SODIUM 40 MG/0.4ML IJ SOSY
40.0000 mg | PREFILLED_SYRINGE | INTRAMUSCULAR | Status: DC
  Administered 2023-01-11 – 2023-01-15 (×4): 40 mg via SUBCUTANEOUS
  Filled 2023-01-11 (×4): qty 0.4

## 2023-01-11 MED ORDER — PREDNISONE 20 MG PO TABS
40.0000 mg | ORAL_TABLET | Freq: Every day | ORAL | Status: DC
  Administered 2023-01-13 – 2023-01-15 (×3): 40 mg via ORAL
  Filled 2023-01-11 (×3): qty 2

## 2023-01-11 MED ORDER — LORAZEPAM 1 MG PO TABS
1.0000 mg | ORAL_TABLET | ORAL | Status: AC | PRN
  Administered 2023-01-11: 3 mg via ORAL
  Administered 2023-01-11 – 2023-01-12 (×2): 2 mg via ORAL
  Administered 2023-01-13: 1 mg via ORAL
  Filled 2023-01-11 (×2): qty 2
  Filled 2023-01-11: qty 3
  Filled 2023-01-11: qty 2

## 2023-01-11 NOTE — H&P (Signed)
History and Physical    Patient: [name] [id number] DOB: 1946/08/31 DOA: 01/10/2023 DOS: the patient was seen and examined on 01/11/2023 PCP: Albina Billet, MD  Patient coming from: Home  Chief Complaint:  Chief Complaint  Patient presents with   Respiratory Distress   HPI: [name] is a 77 y.o. female with medical history significant of seen failure, coronary artery disease, depression, severe protein calorie malnutrition with underweight, GERD, tobacco abuse, hyperlipidemia and hypothyroidism, essential hypertension who presented to the ER with progressive shortness of breath cough and wheezing.  Patient is normally on 3 L of oxygen per minute at home.  In the ER oxygen sat was in the 80s on her 3 L.  Patient currently on BiPAP.  She was found to be in acute respiratory distress.  Chest x-ray showed no acute findings.  Acute viral screen was negative for flu COVID and other viruses.  Patient appears to have acute on chronic respiratory failure with hypoxia and hypercarbia secondary to COPD exacerbation.  No identified trigger.  Patient is being admitted for further evaluation and treatment  Review of Systems: As mentioned in the history of present illness. All other systems reviewed and are negative. Past Medical History:  Diagnosis Date   Anxiety    CAD (coronary artery disease)    Carpal tunnel syndrome    Cataract    Complication of anesthesia    has woken up at times   COPD (chronic obstructive pulmonary disease) (HCC)    CRP elevated 09/14/2015   Depression    Difficult intubation    Dyslipidemia    Dyspnea    DOE   Dysrhythmia    hx palpatations   Elevated sedimentation rate 09/14/2015   Esophageal spasm    Gastrointestinal parasites    GERD (gastroesophageal reflux disease)    HCAP (healthcare-associated pneumonia) 10/22/2018   Hiatal hernia    History of peptic ulcer disease    Hypertension    Hyperthyroidism    Hypothyroidism    Low magnesium  levels 09/14/2015   Pelvic fracture ([place]) 2008   fall from riding a horse   Peripheral vascular disease ([place])    Reflux    Rotator cuff injury    Sepsis ([place]) 10/22/2018   Stenosis, spinal, lumbar    Past Surgical History:  Procedure Laterality Date   AMPUTATION TOE Right 12/26/2020   Procedure: AMPUTATION TOE-3rd Toes;  Surgeon: Samara Deist, DPM;  Location: [facility];  Service: Podiatry;  Laterality: Right;   AMPUTATION TOE Right 04/22/2021   Procedure: AMPUTATION TOE- RIGHT 2ND;  Surgeon: Samara Deist, DPM;  Location: [facility];  Service: Podiatry;  Laterality: Right;   AMPUTATION TOE Right 10/21/2021   Procedure: AMPUTATION TOE;  Surgeon: Caroline More, DPM;  Location: [facility];  Service: Podiatry;  Laterality: Right;   AMPUTATION TOE Right 11/23/2021   Procedure: AMPUTATION TOE;  Surgeon: Sharlotte Alamo, DPM;  Location: [facility];  Service: Podiatry;  Laterality: Right;   AMPUTATION TOE Right 07/05/2022   Procedure: AMPUTATION RIGHT 5TH TOE;  Surgeon: [name], DPM;  Location: [facility];  Service: Podiatry;  Laterality: Right;   APPENDECTOMY     BACK SURGERY     CERVICAL FUSION   CARPAL TUNNEL RELEASE     CATARACT EXTRACTION W/PHACO Right 08/07/2017   Procedure: CATARACT EXTRACTION PHACO AND INTRAOCULAR LENS PLACEMENT ([place]);  Surgeon: Birder Robson, MD;  Location: [facility];  Service: Ophthalmology;  Laterality: Right;  [place] 00:52.0 AP% 16.8 CDE 8.74  Fluid Pack Lot # [id number]   CATARACT EXTRACTION W/PHACO Left 09/04/2017   Procedure: CATARACT EXTRACTION PHACO AND INTRAOCULAR LENS PLACEMENT ([facility]);  Surgeon: Birder Robson, MD;  Location: [facility];  Service: Ophthalmology;  Laterality: Left;  [place] 00:34 AP% 17.0 CDE 5.80 Fluid pack lot # [id number]   CESAREAN SECTION     CHOLECYSTECTOMY     FRACTURE SURGERY     HIP SURGERY     INTRAMEDULLARY (IM) NAIL INTERTROCHANTERIC Right 10/01/2016   Procedure: INTRAMEDULLARY (IM) NAIL INTERTROCHANTRIC;  Surgeon: Corky Mull, MD;  Location:  [facility];  Service: Orthopedics;  Laterality: Right;   INTRAMEDULLARY (IM) NAIL INTERTROCHANTERIC Left 01/14/2021   Procedure: INTRAMEDULLARY (IM) NAIL INTERTROCHANTRIC;  Surgeon: Lovell Sheehan, MD;  Location: [facility];  Service: Orthopedics;  Laterality: Left;   KYPHOPLASTY N/A 08/20/2018   Procedure: [id number];  Surgeon: Hessie Knows, MD;  Location: [facility];  Service: Orthopedics;  Laterality: N/A;   LOWER EXTREMITY ANGIOGRAPHY Right 12/18/2019   Procedure: LOWER EXTREMITY ANGIOGRAPHY;  Surgeon: Algernon Huxley, MD;  Location: [place] [facility];  Service: Cardiovascular;  Laterality: Right;   LOWER EXTREMITY ANGIOGRAPHY Right 12/02/2020   Procedure: LOWER EXTREMITY ANGIOGRAPHY;  Surgeon: Algernon Huxley, MD;  Location: [place] [facility];  Service: Cardiovascular;  Laterality: Right;   LOWER EXTREMITY ANGIOGRAPHY Right 12/23/2020   Procedure: Lower Extremity Angiography;  Surgeon: Algernon Huxley, MD;  Location: [place] [facility];  Service: Cardiovascular;  Laterality: Right;   LOWER EXTREMITY ANGIOGRAPHY Right 12/24/2020   Procedure: Lower Extremity Angiography;  Surgeon: Algernon Huxley, MD;  Location: [place] [facility];  Service: Cardiovascular;  Laterality: Right;   LOWER EXTREMITY ANGIOGRAPHY Right 10/20/2021   Procedure: Lower Extremity Angiography;  Surgeon: Algernon Huxley, MD;  Location: [place] [facility];  Service: Cardiovascular;  Laterality: Right;   LOWER EXTREMITY ANGIOGRAPHY Right 07/03/2022   Procedure: Lower Extremity Angiography;  Surgeon: Algernon Huxley, MD;  Location: [place] [facility];  Service: Cardiovascular;  Laterality: Right;   NECK SURGERY     NOSE SURGERY     ROTATOR CUFF REPAIR     x2   SHOULDER ARTHROSCOPY  12/07/2011   Procedure: ARTHROSCOPY SHOULDER;  Surgeon: Ninetta Lights, MD;  Location: [facility];  Service: Orthopedics;  Laterality: Right;  Debridement Partial Cuff Tear, Release Coracoacromial Ligament   SHOULDER SURGERY  12/07/2011   right    Social History:  reports that she has been smoking cigarettes. She has never used smokeless tobacco. She reports that she does not drink alcohol and does not use drugs.  No Known Allergies  Family History  Problem Relation Age of Onset   Aneurysm Other     Prior to Admission medications   Medication Sig Start Date End Date Taking? Authorizing Provider  albuterol (VENTOLIN HFA) 108 (90 Base) MCG/ACT inhaler Inhale 1-2 puffs into the lungs every 4 (four) hours as needed for shortness of breath or wheezing. 06/28/22   [provider]  ALPRAZolam Duanne Moron) 1 MG tablet Take 1 mg by mouth 3 (three) times daily as needed. 10/06/21   [provider]  amLODipine (NORVASC) 5 MG tablet Take 1 tablet (5 mg total) by mouth daily. 11/10/22   Lorella Nimrod, MD  ammonium lactate (LAC-HYDRIN) 12 % lotion APPLY TO AFFECTED AREA AS NEEDED FOR DRY SKIN 01/02/23   Felipa Furnace, DPM  atorvastatin (LIPITOR) 10 MG tablet Take 1 tablet (10 mg total) by mouth daily. 11/10/22 11/10/23  Lorella Nimrod, MD  COMBIVENT RESPIMAT 20-100 MCG/ACT AERS respimat Inhale 1-2 puffs into the lungs 4 (four) times daily. 11/25/21   Loletha Grayer, MD  levothyroxine (SYNTHROID) 50 MCG tablet Take 50 mcg by mouth every morning. 08/09/22   [provider]  metoprolol tartrate (LOPRESSOR) 50 MG tablet Take 1 tablet (50 mg total) by mouth 2 (two) times daily. 11/10/22 02/08/23  Lorella Nimrod, MD  Multiple Vitamin (MULTIVITAMIN WITH MINERALS) TABS tablet Take 1 tablet by mouth daily. 11/10/22   Lorella Nimrod, MD  nutrition supplement, JUVEN, (JUVEN) PACK Take 1 packet by mouth 2 (two) times daily between meals. Patient not taking: Reported on 06/29/2022 11/25/21   Loletha Grayer, MD  polyethylene glycol (MIRALAX / GLYCOLAX) 17 g packet Take 17 g by mouth daily. Patient not taking: Reported on 06/29/2022 11/25/21   Loletha Grayer, MD  senna-docusate (SENOKOT-S) 8.6-50 MG tablet Take 2 tablets by mouth at  bedtime. Patient not taking: Reported on 06/29/2022 11/25/21   Loletha Grayer, MD  thiamine (VITAMIN B-1) 100 MG tablet Take 1 tablet (100 mg total) by mouth daily. 11/10/22   Lorella Nimrod, MD  traZODone (DESYREL) 150 MG tablet Take 300 mg by mouth at bedtime as needed. 09/22/21   [provider]    Physical Exam: Vitals:   01/10/23 2307 01/10/23 2309  BP:  (!) 146/86  Pulse:  (!) 124  Resp:  (!) 40  Temp:  98.5 F (36.9 C)  TempSrc:  Oral  SpO2:  100%  Weight: 45.4 kg   Height: 5' 4"$  (1.626 m)    Constitutional: Chronically ill looking cachectic in obvious respiratory distress  Eyes: PERRL, lids and conjunctivae normal ENMT: Mucous membranes are moist. Posterior pharynx clear of any exudate or lesions.Normal dentition.  Neck: normal, supple, no masses, no thyromegaly Respiratory: Decreased air entry bilaterally with marked expiratory wheezing no accessory muscle use.  Cardiovascular: Sinus tachycardia  no murmurs / rubs / gallops. No extremity edema. 2+ pedal pulses. No carotid bruits.  Abdomen: no tenderness, no masses palpated. No hepatosplenomegaly. Bowel sounds positive.  Musculoskeletal: Good range of motion, no joint swelling or tenderness, Skin: no rashes, lesions, ulcers. No induration Neurologic: CN 2-12 grossly intact. Sensation intact, DTR normal. Strength 5/5 in all 4.  Psychiatric: Normal judgment and insight. Alert and oriented x 3. Normal mood  Data Reviewed:  Potassium is 2.9 , pCO2 of 80, glucose 110 CO2 36 acute viral screen is negative.  Chest x-ray showed no acute findings  Assessment and Plan:  #1 acute on chronic respiratory failure with hypoxia and hypercarbia: Secondary to COPD exacerbation.  Patient is currently on BiPAP.  We will admit the patient to progressive care.  IV steroid, antibiotic, supportive care.  #2 acute exacerbation of COPD: Continue as per above  #3 anxiety disorder: Resume home benzodiazepines  #4 severe protein calorie  malnutrition: Dietary counseling  #5 hyperlipidemia: Continue statin  #6 hypothyroidism: Continue levothyroxine  #7 tobacco abuse: Tobacco cessation counseling with nicotine patch     Advance Care Planning:   Code Status: Full Code   Consults: None  Family Communication: No family at bedside  Severity of Illness: The appropriate patient status for this patient is INPATIENT. Inpatient status is judged to be reasonable and necessary in order to provide the required intensity of service to ensure the patient's safety. The patient's presenting symptoms, physical exam findings, and initial radiographic and laboratory data in the context of their chronic comorbidities is felt to place them at high risk  for further clinical deterioration. Furthermore, it is not anticipated that the patient will be medically stable for discharge from the hospital within 2 midnights of admission.   * I certify that at the point of admission it is my clinical judgment that the patient will require inpatient hospital care spanning beyond 2 midnights from the point of admission due to high intensity of service, high risk for further deterioration and high frequency of surveillance required.*  AuthorBarbette Merino, MD 01/11/2023 12:40 AM  For on call review [email or web address].

## 2023-01-11 NOTE — Evaluation (Signed)
Occupational Therapy Evaluation Patient Details Name: [name] MRN: [id number] DOB: 08-12-1946 Today's Date: 01/11/2023   History of Present Illness 77 y/o female here with shortness of breath, acute on chronic respiratory failure.  She was  here 2 months ago for AMS. PMH: CAD, COPD, Afib, depression, HTN, PVD   Clinical Impression   [name] was seen for OT evaluation this date. Prior to hospital admission, pt was MOD I per chart. Pt poor historian. Pt presents to acute OT demonstrating impaired ADL performance and functional mobility 2/2 decreased activity tolerance and functional strength/ROM/balance deficits. On arrival pt doffed [place] , SpO2 84% on RA, resolved to 90s with 3L [place]. Pt continues to remove despite cues.    Pt currently requires MOD A don/doff brief in standing. CGA + RW sit<>stand x2. Pt would benefit from skilled OT to address noted impairments and functional limitations (see below for any additional details). Upon hospital discharge, recommend [place] with 24/7 supervision, may benefit from STR if pt unable to have supervision.    Recommendations for follow up therapy are one component of a multi-disciplinary discharge planning process, led by the attending physician.  Recommendations may be updated based on patient status, additional functional criteria and insurance authorization.   Follow Up Recommendations  Home health OT (if unable to have supervision at home recommend STR)     Assistance Recommended at Discharge Frequent or constant Supervision/Assistance  Patient can return home with the following A little help with walking and/or transfers;A little help with bathing/dressing/bathroom;Help with stairs or ramp for entrance;Direct supervision/assist for medications management;Assistance with cooking/housework    Functional Status Assessment  Patient has had a recent decline in their functional status and demonstrates the ability to make significant improvements in  function in a reasonable and predictable amount of time.  Equipment Recommendations  BSC/3in1    Recommendations for Other Services       Precautions / Restrictions Precautions Precautions: Fall Restrictions Weight Bearing Restrictions: No      Mobility Bed Mobility Overal bed mobility: Needs Assistance Bed Mobility: Supine to Sit, Sit to Supine     Supine to sit: Supervision Sit to supine: Supervision        Transfers Overall transfer level: Needs assistance   Transfers: Sit to/from Stand Sit to Stand: Min guard           General transfer comment: no assist however maintains forward flexed posture and unable to take steps, suspect may be limited by cognition vs hearing      Balance Overall balance assessment: Needs assistance Sitting-balance support: No upper extremity supported Sitting balance-Leahy Scale: Good     Standing balance support: Bilateral upper extremity supported Standing balance-Leahy Scale: Fair                             ADL either performed or assessed with clinical judgement   ADL Overall ADL's : Needs assistance/impaired                                       General ADL Comments: MOD A don/doff brief in standing. CGA + RW for simulated BSC t/f.      Pertinent Vitals/Pain Pain Assessment Pain Assessment: No/denies pain     Hand Dominance     Extremity/Trunk Assessment Upper Extremity Assessment Upper Extremity Assessment: Generalized weakness   Lower Extremity Assessment  Lower Extremity Assessment: Generalized weakness       Communication Communication Communication: HOH   Cognition Arousal/Alertness: Awake/alert Behavior During Therapy: Restless, Impulsive Overall Cognitive Status: No family/caregiver present to determine baseline cognitive functioning                                 General Comments: Difficulty following commands, pt repeatedly removing O2     General  Comments  SpO2 84% on RA on arrival, resolved to 90s with 3L [place]            Home Living Family/patient expects to be discharged to:: Private residence Living Arrangements: Alone Available Help at Discharge: Family;Personal care attendant;Available PRN/intermittently (pt indicates that she has paid help QD, prior notes indicate PCA 2hr/day) Type of Home: House Home Access: Stairs to enter [employer] of Steps: 4 Entrance Stairs-Rails: Left Home Layout: One level     Bathroom Shower/Tub: Walk-in shower;Tub/shower unit   Bathroom Toilet: Standard     Home Equipment: Conservation officer, nature (2 wheels);Rollator (4 wheels);Shower seat;Cane - single point   Additional Comments: information obtained from prior docmentation, pt very difficult to get consistent answers to questions      Prior Functioning/Environment Prior Level of Function : Independent/Modified Independent (per pt, difficult to know verocity of her responses)             Mobility Comments: amb with rollator ADLs Comments: pt performs ADL with SET UP, aids assit with IADLs,        OT Problem List: Decreased strength;Decreased range of motion;Decreased activity tolerance;Impaired balance (sitting and/or standing);Decreased safety awareness      OT Treatment/Interventions: Self-care/ADL training;Therapeutic exercise;Energy conservation;DME and/or AE instruction;Therapeutic activities;Balance training;Patient/family education    OT Goals(Current goals can be found in the care plan section) Acute Rehab OT Goals Patient Stated Goal: to breathe better OT Goal Formulation: With patient Time For Goal Achievement: 01/25/23 Potential to Achieve Goals: Good ADL Goals Pt Will Perform Grooming: with modified independence;standing Pt Will Perform Lower Body Dressing: with modified independence;sit to/from stand Pt Will Transfer to Toilet: with modified independence;regular height toilet;bedside commode  OT Frequency:  Min 2X/week    Co-evaluation              AM-PAC OT "6 Clicks" Daily Activity     Outcome Measure Help from another person eating meals?: None Help from another person taking care of personal grooming?: A Little Help from another person toileting, which includes using toliet, bedpan, or urinal?: A Little Help from another person bathing (including washing, rinsing, drying)?: A Little Help from another person to put on and taking off regular upper body clothing?: A Little Help from another person to put on and taking off regular lower body clothing?: A Lot 6 Click Score: 18   End of Session Equipment Utilized During Treatment: Rolling walker (2 wheels)  Activity Tolerance: Patient tolerated treatment well Patient left: in bed;with call bell/phone within reach;with bed alarm set  OT Visit Diagnosis: Other abnormalities of gait and mobility (R26.89);Muscle weakness (generalized) (M62.81)                Time: [id number] OT Time Calculation (min): 18 min Charges:  OT General Charges $OT Visit: 1 Visit OT Evaluation $OT Eval Low Complexity: 1 Low  Dessie Coma, M.S. OTR/L  01/11/23, 2:30 PM  ascom [phone]

## 2023-01-11 NOTE — Progress Notes (Signed)
Patient very agitated, yelling at staff, confused. Was finally able to get patient to take her po ativan. Current iv was burning and leaking. Later pulled out by patient. Unable to get new at this time due to agitation. Will let patient calm down and attempt iv again

## 2023-01-11 NOTE — ED Notes (Signed)
PT refused BiPAP and continues to try to get out of bed.  Pt continues to need much redirection.  Will continue plan of care.

## 2023-01-11 NOTE — ED Notes (Signed)
Patient pulled Bipap off. Refusing to wear Bipap at this time. Placed on 2L per [place]. Oxygen saturation currently at 98%.

## 2023-01-11 NOTE — Progress Notes (Signed)
Patient placed on BIPAP for respiratory distress, given Duoneb via in-line, and ABG drawn and resulted with critical values reported to MD. Patient stable at this time.

## 2023-01-11 NOTE — TOC Initial Note (Signed)
Transition of Care [facility]) - Initial/Assessment Note    Patient Details  Name: [name] MRN: [id number] Date of Birth: 1946-02-25  Transition of Care [facility]) CM/SW Contact:    Tiburcio Bash, LCSW Phone Number: 01/11/2023, 11:08 AM  Clinical Narrative:                  CSW notes patient is from home, admitted with acute respiratory distress, chronic respiratory failure with hypoxia and COPD exacerbation.   Patient has hx of amedysis hh DME at home: RW, rollator, shower seat Home O2: [employer] PCP: Hall Busing  Pending PT/OT evlas (orders in) for recommendations.   TOC will continue to follow for discharge planning.   Expected Discharge Plan: Home/Self Care Barriers to Discharge: Continued Medical Work up   Patient Goals and CMS Choice Patient states their goals for this hospitalization and ongoing recovery are:: to go home CMS Medicare.gov Compare Post Acute Care list provided to:: Patient   [place] ownership interest in [facility].provided to:: Patient    Expected Discharge Plan and Services                                              Prior Living Arrangements/Services                       Activities of Daily Living Home Assistive Devices/Equipment: Eyeglasses, Oxygen ADL Screening (condition at time of admission) Patient's cognitive ability adequate to safely complete daily activities?: Yes Is the patient deaf or have difficulty hearing?: No Does the patient have difficulty seeing, even when wearing glasses/contacts?: No Does the patient have difficulty concentrating, remembering, or making decisions?: No Patient able to express need for assistance with ADLs?: Yes Does the patient have difficulty dressing or bathing?: Yes Independently performs ADLs?: No Communication: Independent Dressing (OT): Needs assistance Is this a change from baseline?: Change from baseline, expected to last <3days Grooming: Independent Feeding:  Independent Bathing: Needs assistance Is this a change from baseline?: Change from baseline, expected to last >3 days Toileting: Needs assistance Is this a change from baseline?: Change from baseline, expected to last >3days In/Out Bed: Needs assistance Is this a change from baseline?: Change from baseline, expected to last >3 days Walks in Home: Needs assistance Is this a change from baseline?: Change from baseline, expected to last >3 days Does the patient have difficulty walking or climbing stairs?: Yes Weakness of Legs: Both Weakness of Arms/Hands: None  Permission Sought/Granted                  Emotional Assessment              Admission diagnosis:  Acute on chronic respiratory failure with hypoxemia ([place]) [J96.21] Patient Active Problem List   Diagnosis Date Noted   Acute on chronic respiratory failure with hypoxemia ([place]) 01/11/2023   AMS (altered mental status) 11/08/2022   Underweight 11/08/2022   Acute on chronic respiratory failure with hypoxia ([place]) 06/30/2022   Osteomyelitis of fifth toe of right foot ([place]) 06/30/2022   Dyslipidemia 06/30/2022   Subacute osteomyelitis of right foot ([place])    Essential hypertension    Protein-calorie malnutrition, severe 11/21/2021   COPD with acute exacerbation ([place]) 11/21/2021   COPD exacerbation ([place]) 11/20/2021   Chronic pain - multiple sites arthritis 11/20/2021   Benzodiazepine dependence, continuous ([place]) 11/20/2021  Peripheral vascular disease ([place]) 11/20/2021   Hyponatremia 11/20/2021   Hypocalcemia 11/20/2021   Hypoalbuminemia due to protein-calorie malnutrition ([place]) 11/20/2021   Anemia 11/20/2021   Acute respiratory failure with hypoxemia (HCC)    Benzodiazepine withdrawal without complication (HCC)    Malnutrition of moderate degree 10/20/2021   Absent pedal pulses 10/19/2021   Chronic hip pain (Left) 02/17/2021   Hip pain, acute (Left) 02/17/2021   Chronic use of opiate for therapeutic purpose  02/16/2021   Closed fracture of hip, sequela (Left) 02/07/2021   Fracture of femoral neck, left, closed ([place]) 01/11/2021   Chronic respiratory failure with hypoxia ([place]) 01/11/2021   Hypokalemia 01/11/2021   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 ([place]) 12/22/2020   PAD (peripheral artery disease) ([place]) 12/22/2020   Encounter for long-term opiate analgesic use 11/11/2020   Dry gangrene ([place]) of middle toe, right foot 11/11/2020   Foot pain, right 11/11/2020   Hard of hearing 11/11/2020   Atherosclerosis of native arteries of the extremities with gangrene ([place]) 12/09/2019   Vitamin D deficiency 11/12/2019   Pharmacologic therapy 06/04/2019   Disorder of skeletal system 06/04/2019   Problems influencing health status 06/04/2019   Compression fracture of L3 vertebra (HCC) 08/19/2018   Chronic hip pain (Bilateral) 12/31/2017   Neurogenic pain 08/29/2017   Chronic low back pain (1ry area of Pain) (Right) w/o sciatica 08/29/2017   Chronic sacroiliac joint pain (Right) 06/05/2017   Vitamin D insufficiency 03/12/2017   Right hip pain 02/20/2017   Intertrochanteric fracture of right hip, sequela 02/20/2017   B12 deficiency 02/05/2017   Pressure injury of skin 10/02/2016   Closed displaced intertrochanteric fracture of right femur ([place]) 10/02/2016   Hip fracture ([place]) 10/01/2016   Cervical facet hypertrophy (Bilateral) 05/27/2016   History of shoulder surgery 5 (Right) 05/27/2016   Lumbar foraminal stenosis (L3-4) (Left) 05/27/2016   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) 05/27/2016   Lumbar facet hypertrophy (Bilateral) 05/27/2016   Lumbar facet syndrome (Right) 05/27/2016   Lumbar grade 1 Anterolisthesis of L3 over L4 05/27/2016   Chronic shoulder pain (Bilateral) (status post multiple surgeries) (R>L) 12/08/2015   Substance use disorder Risk: High 09/14/2015   Chronic pain syndrome 09/14/2015   Cervical spondylosis 09/14/2015   Chronic neck pain (2ry area of Pain)  (Bilateral) (R>L) 09/14/2015   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) 09/14/2015   Cervical facet syndrome (Bilateral) (R>L) 09/14/2015   Cervical myofascial pain syndrome 09/14/2015   Lumbar spondylosis 09/14/2015   Chronic shoulder impingement syndrome (Right) 09/14/2015   Hypomagnesemia 09/14/2015   CRP elevated 09/14/2015   Elevated sedimentation rate 09/14/2015   Chronic obstructive pulmonary disease (COPD) ([place]) 09/14/2015   Nicotine dependence 09/14/2015   Chronic shoulder pain (Right) 09/14/2015   Abnormal nerve conduction studies 09/14/2015   Encounter for therapeutic drug level monitoring 09/09/2015   Long term current use of opiate analgesic 09/09/2015   Long term prescription opiate use [id number]   Uncomplicated opioid dependence ([place]) 09/09/2015   Opiate use 09/09/2015   Anxiety and depression 07/11/2012   Tobacco use disorder 05/27/2011   Fatigue 05/27/2011   Hypothyroidism 11/25/2010   HLD (hyperlipidemia) 08/24/2010   Tachycardia 08/24/2010   DYSPNEA 08/24/2010   PCP:  Albina Billet, MD Pharmacy:   [place], [place] - [place] [address] Phone: [phone] Fax: [phone]  [id number] [place] [address]  [place] [address] Phone: [phone] Fax: [phone]  [facility] [place], [place] [address] Phone: [phone] Fax: [place] [address] [place] [address] Phone: [phone] Fax: [phone]     Social Determinants of Health (SDOH) Social History: SDOH Screenings   Depression [phone]): Low Risk  (07/13/2021)  Tobacco Use: High Risk (01/11/2023)   SDOH Interventions:     Readmission Risk Interventions    07/05/2022   10:50 AM 11/25/2021    9:50  AM 01/14/2021    8:49 AM  Readmission Risk Prevention Plan  Transportation Screening Complete Complete   PCP or Specialist Appt within 3-5 Days   Complete  HRI or [place]   Complete  Social Work Consult for [place] Planning/Counseling   Complete  Palliative Care Screening   Not Applicable  Medication Review Press photographer) Complete Complete Complete  PCP or Specialist appointment within 3-5 days of discharge  Complete   HRI or [place] Complete Complete   SW Recovery Care/Counseling Consult Complete    [place] Not Applicable Not Applicable

## 2023-01-11 NOTE — Hospital Course (Addendum)
[name] is a 77 y.o. female with medical history significant of seen failure, coronary artery disease, depression, severe protein calorie malnutrition with underweight, GERD, tobacco abuse, hyperlipidemia and hypothyroidism, essential hypertension who presented to the ER with progressive shortness of breath cough and wheezing.  Patient is normally on 3 L of oxygen per minute at home.   02/21: In the ER oxygen sat was in the 80s on her 3 L.  Patient placed on BiPAP. Chest x-ray showed no acute findings. Acute viral screen was negative for flu COVID and other viruses. Patient appears to have acute on chronic respiratory failure with hypoxia and hypercarbia secondary to COPD exacerbation. No identified trigger. Admitted to hospitalist.  02/22: Pulled BiPap overnight but saturating better on 2L [place]. In AM, RN reports significant agitation, patient yelling into the hallway questioning if we are killing her. Per chart review, similar behaviors in the past w/ concern for dementia and chronic benzodiazepine use plus alcohol use. Lengthy discussion w/ her daughter in law (HCPOA per advanced directive on file), will d/w family re: code status, she also states patient has been drinking, will start CIWA. Patient does not have adequate capacity to make decisions. Will keep on O2 as best as we can. Family has opted for DNR.  02/23: this morning continued agitation, not keeping BiPap on and also resistant to [place], tachycardic/tachypneic but saturating 88-99% on 4L/min [place]. D/w [place], will get palliative care to see patient  02/24: pending palliative (today is Saturday), maintaining on 6L [place]. Tachycardia/tachypnea have improved.  02/25: maintaining on 4L O2 . Daughter in law [name]) would like to hold discharge until palliative care team can assess tomorrow - given readmission risk I think this is appropriate to establish [place] and would have a strong consideration for hospice. Will reach out to palliative early  tomorrow (today is Sunday)   02/26: palliative care spoke w/ [name] HPOA, pt to d/c home on her baseline O2, GOC discussion - see palliative notes, no hospice at this time but hospice services were d/w HPOA   Consultants:  Palliative Care   Procedures: none      ASSESSMENT & PLAN:   Principal Problem:   Acute on chronic respiratory failure with hypoxemia ([place]) Active Problems:   COPD exacerbation ([place])   Hypokalemia   Essential hypertension   Underweight   Hypothyroidism   Dyslipidemia   HLD (hyperlipidemia)  Acute on chronic respiratory failure with hypoxia and hypercarbia COPD exacerbation - unknown trigger / suspect Rx nonadherence Finishes steroids today Antibiotics w/ ceftriaxone - completed  Bronchodilators scheduled and albuterol prn --> resume home meds  Palliative consult re [name] - goal per [name] is for home if possible. Pt is historically uncooperative with home health therapists/nurses, patient does not eat much, just "does what she wants" so comfort goals with eventual hospice services seem reasonable - HPOA aware of options, pt going home at this time w/ her usual caregiver services.    Hypokalemia  Replaced Monitor BMP outpatient   Alcohol use Abstinence encouraged   Anxiety Benzodiazepine dependence Resume home meds  Will require long taper outpatient to get off this Rx - defer to PCP   Severe protein calorie malnutrition:  Dietary counseling   Hyperlipidemia:  Continue statin   Hypothyroidism:  Continue levothyroxine   Tobacco abuse:  Tobacco cessation counseling with nicotine patch   DVT prophylaxis: lovenox Pertinent IV fluids/nutrition: no continuous fluids  Central lines / invasive devices: none  Code Status: DNR  Current Admission Status: inpatient   TOC needs / Dispo plan: pending palliative consult Barriers to discharge / significant pending items: palliative care consult

## 2023-01-11 NOTE — ED Notes (Signed)
Patient oxygen saturation drops to 75%, this nurse enters room to find patient had removed oxygen. Patient oxygen returned to nostrils, patient coached in breathing, patient saturation increases to 93% on 2L [place]

## 2023-01-11 NOTE — Evaluation (Signed)
Physical Therapy Evaluation Patient Details Name: [name] MRN: [id number] DOB: [birth year] Today's Date: 01/11/2023  History of Present Illness  77 y/o female here with shortness of breath, acute on chronic respiratory failure.  She was  here 2 months ago for AMS. PMH: CAD, COPD, Afib, depression, HTN, PVD  Clinical Impression  Pt struggled to stay on task and was interested in talking and joking around more than a whole lot of activity.  However PT was able to convince her to do some mobility and actually get up and do some ambulation; ~60 ft with walker - not needing direct assist but consistent directional cuing for general safety awareness.  Pt's O2 remained in the 90s on room air, HR near 120 at rest and stays in 120s with activity.  Difficult to get a great idea of what home live looks like and how much help she actually has, but apart from mentation she appears likely close to her baseline.  PT would benefit from more consistent supervision to insure safety but showed ability to move relatively appropriately for in-home activity.     Recommendations for follow up therapy are one component of a multi-disciplinary discharge planning process, led by the attending physician.  Recommendations may be updated based on patient status, additional functional criteria and insurance authorization.  Follow Up Recommendations Home health PT      Assistance Recommended at Discharge Frequent or constant Supervision/Assistance  Patient can return home with the following  Assist for transportation;Assistance with cooking/housework;A little help with bathing/dressing/bathroom;A little help with walking and/or transfers    Equipment Recommendations None recommended by PT  Recommendations for Other Services       Functional Status Assessment Patient has had a recent decline in their functional status and demonstrates the ability to make significant improvements in function in a reasonable and  predictable amount of time.     Precautions / Restrictions Precautions Precautions: Fall Restrictions Weight Bearing Restrictions: No      Mobility  Bed Mobility Overal bed mobility: Needs Assistance Bed Mobility: Supine to Sit, Sit to Supine     Supine to sit: Supervision Sit to supine: Supervision   General bed mobility comments: Pt needed repeated cuing to convince her to get up but once she decided to do so she was able to attain sitting EOB w/o assist    Transfers Overall transfer level: Needs assistance   Transfers: Sit to/from Stand Sit to Stand: Min guard           General transfer comment: cuing to make sure she maintained some safety awareness, attempted to standing w/o AD and before PT set up O2.  Ultimately she was able to rise w/o direct assist but showed poor overall safety awareness    Ambulation/Gait Ambulation/Gait assistance: Supervision Gait Distance (Feet): 60 Feet Assistive device: Rolling walker (2 wheels)         General Gait Details: Pt able to ambulate the relatively confident cadence, appropriate RW use.  She had no LOBs but did show decreased awareness hitting wheel on doorframe, general impulsivity t/o the effort. Her HR was elevated but stable ~120 bpm, O2 stayed in the 90s on 3L.  Stairs            Wheelchair Mobility    Modified Rankin (Stroke Patients Only)       Balance  Pertinent Vitals/Pain Pain Assessment Pain Assessment: No/denies pain    Home Living Family/patient expects to be discharged to:: Private residence Living Arrangements: Alone Available Help at Discharge: Family;Personal care attendant;Available PRN/intermittently (pt indicates that she has paid help QD, prior notes indicate PCA 2hr/day) Type of Home: House Home Access: Stairs to enter Entrance Stairs-Rails: Left Entrance Stairs-Number of Steps: 4   Home Layout: One level Home  Equipment: Conservation officer, nature (2 wheels);Rollator (4 wheels);Shower seat;Cane - single point Additional Comments: information obtained from prior docmentation, pt very difficult to get consistent answers to questions    Prior Function Prior Level of Function : Independent/Modified Independent (per pt, difficult to know verocity of her responses)             Mobility Comments: amb with rollator ADLs Comments: pt performs ADL with SET UP, aids assit with IADLs,     Hand Dominance        Extremity/Trunk Assessment   Upper Extremity Assessment Upper Extremity Assessment: Generalized weakness    Lower Extremity Assessment Lower Extremity Assessment: Generalized weakness       Communication   Communication: HOH  Cognition Arousal/Alertness: Awake/alert Behavior During Therapy: Restless, Impulsive Overall Cognitive Status: Difficult to assess                                 General Comments: Pt difficult to keep on task, unwilling/unable to answer most questions with of topic ramblings        General Comments General comments (skin integrity, edema, etc.): Pt did not need phyiscal assist with most functional tasks, but showed consistent impulsivity and need to redirection t/o the session.    Exercises     Assessment/Plan    PT Assessment Patient needs continued PT services  PT Problem List Decreased strength;Decreased range of motion;Decreased activity tolerance;Decreased balance;Decreased mobility;Decreased safety awareness;Decreased knowledge of use of DME;Decreased cognition;Cardiopulmonary status limiting activity       PT Treatment Interventions DME instruction;Gait training;Functional mobility training;Therapeutic activities;Stair training;Therapeutic exercise;Balance training;Neuromuscular re-education;Patient/family education    PT Goals (Current goals can be found in the Care Plan section)  Acute Rehab PT Goals Patient Stated Goal: Go home  today PT Goal Formulation: With patient Time For Goal Achievement: 01/24/23 Potential to Achieve Goals: Fair    Frequency Min 2X/week     Co-evaluation               AM-PAC PT "6 Clicks" Mobility  Outcome Measure Help needed turning from your back to your side while in a flat bed without using bedrails?: None Help needed moving from lying on your back to sitting on the side of a flat bed without using bedrails?: None Help needed moving to and from a bed to a chair (including a wheelchair)?: A Little Help needed standing up from a chair using your arms (e.g., wheelchair or bedside chair)?: A Little Help needed to walk in hospital room?: A Little Help needed climbing 3-5 steps with a railing? : A Little 6 Click Score: 20    End of Session Equipment Utilized During Treatment: Gait belt;Oxygen Activity Tolerance: Patient tolerated treatment well;Patient limited by fatigue Patient left: in bed;with call bell/phone within reach Nurse Communication: Mobility status PT Visit Diagnosis: Muscle weakness (generalized) (M62.81);Difficulty in walking, not elsewhere classified (R26.2)    Time: [id number] PT Time Calculation (min) (ACUTE ONLY): 35 min   Charges:   PT Evaluation $PT Eval Low Complexity: 1 Low  PT Treatments $Therapeutic Activity: 8-22 mins        Kreg Shropshire, DPT 01/11/2023, 1:33 PM

## 2023-01-11 NOTE — ED Notes (Signed)
Provider notified of acute delirium. Primary RN aware. Bed alarm on, bed locked in lowest position, patient close to the nurse station.    01/11/23 0700  NuDESC - Nursing Delirium Screening Scale (Complete for non-ICU patients)  Disorientation 1  Inappropriate Behavior 2  Inappropriate Communications 2  Illusions/hallucinations 2  Psychomotor Retardation 0  NuDESC Total Score 7

## 2023-01-11 NOTE — Progress Notes (Addendum)
PROGRESS NOTE    [facility]   [id number] DOB: 1946/09/20  DOA: 01/10/2023 Date of Service: 01/11/23 PCP: Albina Billet, MD     Brief Narrative / Hospital Course:  [name] is a 77 y.o. female with medical history significant of seen failure, coronary artery disease, depression, severe protein calorie malnutrition with underweight, GERD, tobacco abuse, hyperlipidemia and hypothyroidism, essential hypertension who presented to the ER with progressive shortness of breath cough and wheezing.  Patient is normally on 3 L of oxygen per minute at home.   02/21: In the ER oxygen sat was in the 80s on her 3 L.  Patient placed on BiPAP. Chest x-ray showed no acute findings. Acute viral screen was negative for flu COVID and other viruses. Patient appears to have acute on chronic respiratory failure with hypoxia and hypercarbia secondary to COPD exacerbation. No identified trigger. Admitted to hospitalist.  02/22: Pulled BiPap overnight but saturating better on 2L [place]. In AM, RN reports significant agitation, patient yelling into the hallway questioning if we are killing her. Per chart review, similar behaviors in the past w/ concern for dementia and chronic benzodiazepine use plus alcohol use. Lengthy discussion w/ her daughter in law (HCPOA per advanced directive on file), will d/w family re: code status, she also states patient has been drinking, will start CIWA. Patient does not have adequate capacity to make decisions. Will keep on O2 as best as we can.   Consultants:  none  Procedures: none      ASSESSMENT & PLAN:   Principal Problem:   Acute on chronic respiratory failure with hypoxemia (HCC) Active Problems:   COPD exacerbation (HCC)   Hypokalemia   Essential hypertension   Underweight   Hypothyroidism   Dyslipidemia   HLD (hyperlipidemia)  Acute on chronic respiratory failure with hypoxia and hypercarbia COPD exacerbation - unknown trigger / suspect Rx  nonadherence BiPAP as needed IV steroid Antibiotics w/ ceftriaxone  Bronchodilators scheduled and albuterol prn    Hypokalemia  Replaced Monitor BMP  Alcohol use CIWA protocol  Anxiety Benzodiazepine dependence holding home benzodiazepines (alprazolam) while on CIWA will use lorazepam Will require long taper outpatient to get off this Rx    Severe protein calorie malnutrition:  Dietary counseling   Hyperlipidemia:  Continue statin   Hypothyroidism:  Continue levothyroxine   Tobacco abuse:  Tobacco cessation counseling with nicotine patch  Advanced care planning: 01/11/23 4:06 PM discussed w/ HCPOA - family has decided for DNR status, chart updated.   DVT prophylaxis: lovenox Pertinent IV fluids/nutrition: no continuous fluids  Central lines / invasive devices: none  Code Status: DNR   Current Admission Status: inpatient   TOC needs / Dispo plan: may need placement, PT/OT eval pending  Barriers to discharge / significant pending items: clinical improvement, anticipate will be here another 1-2 days              Subjective / Brief ROS:  Patient reports short of breath but claustrophobic with masks. Cannot explain why she is here or where she is, thinks we are trying to kill her which is also why she doesn't want to wear the mask Denies CP/SOB but obviously SOB  Family Communication: Lengthy discussion w/ her daughter in law [name] (Terry per advanced directive on file), she will d/w family re: code status, she also states patient has been drinking, smoking, refusing care at home from caretakers, inconsistent with her medications.     Objective Findings:  Vitals:  01/11/23 1100 01/11/23 1200 01/11/23 1213 01/11/23 1300  BP: (!) 142/80 119/80 (!) 144/78 128/87  Pulse: 90 82 86   Resp:   (!) 22   Temp:   98.9 F (37.2 C)   TempSrc:   Oral   SpO2: 93% 97% 96%   Weight:      Height:        Intake/Output Summary (Last 24 hours) at 01/11/2023  1606 Last data filed at 01/11/2023 0459 Gross per 24 hour  Intake 303.69 ml  Output --  Net 303.69 ml   Filed Weights   01/10/23 2307  Weight: 45.4 kg    Examination:  Physical Exam Constitutional:      Appearance: She is ill-appearing.  Cardiovascular:     Rate and Rhythm: Normal rate and regular rhythm.  Pulmonary:     Effort: Respiratory distress present.     Breath sounds: Wheezing present.  Musculoskeletal:     Right lower leg: No edema.     Left lower leg: No edema.  Skin:    General: Skin is dry.     Coloration: Skin is pale.  Neurological:     General: No focal deficit present.     Mental Status: She is alert. She is disoriented.  Psychiatric:     Comments: paranoid          Scheduled Medications:   amLODipine  5 mg Oral Daily   atorvastatin  10 mg Oral Daily   enoxaparin (LOVENOX) injection  40 mg Subcutaneous [id number]   folic acid  1 mg Oral Daily   ipratropium-albuterol  3 mL Nebulization Q6H   levothyroxine  50 mcg Oral Q0600   methylPREDNISolone (SOLU-MEDROL) injection  40 mg Intravenous Q12H   Followed by   Derrill Memo ON 01/12/2023] predniSONE  40 mg Oral Q breakfast   metoprolol tartrate  50 mg Oral BID   multivitamin with minerals  1 tablet Oral Daily   nicotine  21 mg Transdermal Daily   nutrition supplement (JUVEN)  1 packet Oral BID BM   polyethylene glycol  17 g Oral Daily   senna-docusate  2 tablet Oral QHS   thiamine  100 mg Oral Daily    Continuous Infusions:  0.9 % NaCl with KCl 40 mEq / L 100 mL/hr at 01/11/23 0459   cefTRIAXone (ROCEPHIN)  IV Stopped (01/11/23 0459)    PRN Medications:  albuterol, LORazepam **OR** LORazepam, traZODone  Antimicrobials from admission:  Anti-infectives (From admission, onward)    Start     Dose/Rate Route Frequency Ordered Stop   01/11/23 0415  cefTRIAXone (ROCEPHIN) 1 g in sodium chloride 0.9 % 100 mL IVPB        1 g 200 mL/hr over 30 Minutes Intravenous Every 24 hours 01/11/23 0407 01/16/23 0414            Data Reviewed:  I have personally reviewed the following...  CBC: Recent Labs  Lab 01/10/23 2314 01/11/23 0536  WBC 6.4 5.1  NEUTROABS 4.0  --   HGB 11.2* 11.0*  HCT 38.4 37.3  MCV 93.2 92.8  PLT 255 [id number]   Basic Metabolic Panel: Recent Labs  Lab 01/10/23 2314 01/11/23 0536  NA 142  --   K 2.9*  --   CL 98  --   CO2 36*  --   GLUCOSE 110*  --   BUN 11  --   CREATININE 0.64 0.58  CALCIUM 8.5*  --   MG 1.7  --    GFR:  Estimated Creatinine Clearance: 43.5 mL/min (by C-G formula based on SCr of 0.58 mg/dL). Liver Function Tests: Recent Labs  Lab 01/10/23 2314  AST 17  ALT 12  ALKPHOS 82  BILITOT 0.8  PROT 7.1  ALBUMIN 3.5   No results for input(s): "LIPASE", "AMYLASE" in the last 168 hours. No results for input(s): "AMMONIA" in the last 168 hours. Coagulation Profile: No results for input(s): "INR", "PROTIME" in the last 168 hours. Cardiac Enzymes: No results for input(s): "CKTOTAL", "CKMB", "CKMBINDEX", "TROPONINI" in the last 168 hours. BNP (last 3 results) No results for input(s): "PROBNP" in the last 8760 hours. HbA1C: No results for input(s): "HGBA1C" in the last 72 hours. CBG: No results for input(s): "GLUCAP" in the last 168 hours. Lipid Profile: No results for input(s): "CHOL", "HDL", "LDLCALC", "TRIG", "CHOLHDL", "LDLDIRECT" in the last 72 hours. Thyroid Function Tests: No results for input(s): "TSH", "T4TOTAL", "FREET4", "T3FREE", "THYROIDAB" in the last 72 hours. Anemia Panel: No results for input(s): "VITAMINB12", "FOLATE", "FERRITIN", "TIBC", "IRON", "RETICCTPCT" in the last 72 hours. Most Recent Urinalysis On File:     Component Value Date/Time   COLORURINE YELLOW (A) 11/08/2022 1252   APPEARANCEUR HAZY (A) 11/08/2022 1252   LABSPEC 1.021 11/08/2022 1252   PHURINE 6.0 11/08/2022 1252   GLUCOSEU NEGATIVE 11/08/2022 1252   HGBUR SMALL (A) 11/08/2022 1252   BILIRUBINUR NEGATIVE 11/08/2022 1252   KETONESUR 5 (A) 11/08/2022  1252   PROTEINUR 30 (A) 11/08/2022 1252   NITRITE NEGATIVE 11/08/2022 1252   LEUKOCYTESUR SMALL (A) 11/08/2022 1252   Sepsis Labs: @LABRCNTIP$ (procalcitonin:4,lacticidven:4) Microbiology: Recent Results (from the past 240 hour(s))  Resp panel by RT-PCR (RSV, Flu A&B, Covid) Anterior Nasal Swab     Status: None   Collection Time: 01/10/23 11:14 PM   Specimen: Anterior Nasal Swab  Result Value Ref Range Status   SARS Coronavirus 2 by RT PCR NEGATIVE NEGATIVE Final    Comment: (NOTE) SARS-CoV-2 target nucleic acids are NOT DETECTED.  The SARS-CoV-2 RNA is generally detectable in upper respiratory specimens during the acute phase of infection. The lowest concentration of SARS-CoV-2 viral copies this assay can detect is 138 copies/mL. A negative result does not preclude SARS-Cov-2 infection and should not be used as the sole basis for treatment or other patient management decisions. A negative result may occur with  improper specimen collection/handling, submission of specimen other than nasopharyngeal swab, presence of viral mutation(s) within the areas targeted by this assay, and inadequate number of viral copies(<138 copies/mL). A negative result must be combined with clinical observations, patient history, and epidemiological information. The expected result is Negative.  Fact Sheet for Patients:  EntrepreneurPulse.com.au  Fact Sheet for Healthcare Providers:  IncredibleEmployment.be  This test is no t yet approved or cleared by the [place] FDA and  has been authorized for detection and/or diagnosis of SARS-CoV-2 by FDA under an Emergency Use Authorization (EUA). This EUA will remain  in effect (meaning this test can be used) for the duration of the COVID-19 declaration under Section 564(b)(1) of the Act, 21 U.S.C.section 360bbb-3(b)(1), unless the authorization is terminated  or revoked sooner.       Influenza A by PCR NEGATIVE  NEGATIVE Final   Influenza B by PCR NEGATIVE NEGATIVE Final    Comment: (NOTE) The Xpert Xpress SARS-CoV-2/FLU/RSV plus assay is intended as an aid in the diagnosis of influenza from Nasopharyngeal swab specimens and should not be used as a sole basis for treatment. Nasal washings and aspirates are unacceptable for Xpert  Xpress SARS-CoV-2/FLU/RSV testing.  Fact Sheet for Patients: EntrepreneurPulse.com.au  Fact Sheet for Healthcare Providers: IncredibleEmployment.be  This test is not yet approved or cleared by the [place] FDA and has been authorized for detection and/or diagnosis of SARS-CoV-2 by FDA under an Emergency Use Authorization (EUA). This EUA will remain in effect (meaning this test can be used) for the duration of the COVID-19 declaration under Section 564(b)(1) of the Act, 21 U.S.C. section 360bbb-3(b)(1), unless the authorization is terminated or revoked.     Resp Syncytial Virus by PCR NEGATIVE NEGATIVE Final    Comment: (NOTE) Fact Sheet for Patients: EntrepreneurPulse.com.au  Fact Sheet for Healthcare Providers: IncredibleEmployment.be  This test is not yet approved or cleared by the [place] FDA and has been authorized for detection and/or diagnosis of SARS-CoV-2 by FDA under an Emergency Use Authorization (EUA). This EUA will remain in effect (meaning this test can be used) for the duration of the COVID-19 declaration under Section 564(b)(1) of the Act, 21 U.S.C. section 360bbb-3(b)(1), unless the authorization is terminated or revoked.  Performed at [facility], [address] [place], [place] [address]   Culture, blood (routine x 2)     Status: None (Preliminary result)   Collection Time: 01/10/23 11:15 PM   Specimen: BLOOD  Result Value Ref Range Status   Specimen Description   Final    BLOOD LEFT ASSIST CONTROL Performed at [facility], [address], [place], [place] [address]    Special Requests   Final    BOTTLES DRAWN AEROBIC AND ANAEROBIC Blood Culture adequate volume Performed at [facility], [address], [place], [place] [address]    Culture   Final    NO GROWTH < 24 HOURS Performed at [facility], [place] [address] [place], [place] [address]    Report Status PENDING  Incomplete  Culture, blood (routine x 2)     Status: None (Preliminary result)   Collection Time: 01/10/23 11:20 PM   Specimen: BLOOD  Result Value Ref Range Status   Specimen Description   Final    BLOOD  LEFT AC Performed at [facility], [address] [place], [place] [address]    Special Requests   Final    BOTTLES DRAWN AEROBIC AND ANAEROBIC Blood Culture results may not be optimal due to an inadequate volume of blood received in culture bottles Performed at [facility], [address] [place], [place] [address]    Culture   Final    NO GROWTH < 24 HOURS Performed at [facility], [place] [address] [place],  [address]    Report Status PENDING  Incomplete      Radiology Studies last 3 days: DG Chest Port 1 View  Result Date: 01/11/2023 CLINICAL DATA:  sob EXAM: PORTABLE CHEST 1 VIEW COMPARISON:  Chest x-ray 11/08/2022, CT chest 01/13/2021 FINDINGS: The heart and mediastinal contours are unchanged. Aortic calcification. No focal consolidation. Chronic coarsened markings with no overt pulmonary edema. No pleural effusion. No pneumothorax. No acute osseous abnormality. IMPRESSION: 1. No active disease. 2.  Aortic Atherosclerosis (ICD10-I70.0). Electronically Signed   By: Iven Finn M.D.   On: 01/11/2023 00:17             LOS: 0 days    Time spent: 50 min    Emeterio Reeve, DO [facility] 01/11/2023, 4:06 PM    Dictation software may have been used to generate the above note. Typos may occur  and escape review in typed/dictated notes. Please contact Dr  Sheppard Coil directly for clarity if needed.  Staff may message me via secure chat in [place]  but this may not receive an immediate response,  please page me for urgent matters!  If 7PM-7AM, please contact night coverage [email or web address]

## 2023-01-11 NOTE — ED Notes (Signed)
Pt Oxygen Saturation 85%. This RN did find pt had removed 3LNC.  3LNC cannula replaced.  Pt O2 sats at 98%.  Pt is anxious and delusion... "Please call my Moma, let me get to my bag with Zanax so I can breathe.  Pt has continues to present with restless movement and redirected to stay in bed to prevent any falls. Yelling this RNs name in the hall. Pt redirected and encouraged to comply with 3LNC to prevent desaturation and need for BiPAP.  Marland Kitchen

## 2023-01-11 NOTE — ED Notes (Signed)
RT called due to pt work of breathing.  RT at bedside applying BiPAP.  MD Notified

## 2023-01-12 DIAGNOSIS — J9621 Acute and chronic respiratory failure with hypoxia: Secondary | ICD-10-CM | POA: Diagnosis not present

## 2023-01-12 LAB — MRSA NEXT GEN BY PCR, NASAL: MRSA by PCR Next Gen: DETECTED — AB

## 2023-01-12 MED ORDER — ENSURE ENLIVE PO LIQD
237.0000 mL | Freq: Three times a day (TID) | ORAL | Status: DC
  Administered 2023-01-12 – 2023-01-15 (×8): 237 mL via ORAL

## 2023-01-12 MED ORDER — LORAZEPAM 2 MG/ML IJ SOLN
2.0000 mg | INTRAMUSCULAR | Status: DC | PRN
  Administered 2023-01-12 – 2023-01-15 (×7): 2 mg via INTRAVENOUS
  Filled 2023-01-12 (×7): qty 1

## 2023-01-12 NOTE — Progress Notes (Signed)
Initial Nutrition Assessment  DOCUMENTATION CODES:   Severe malnutrition in context of chronic illness, Underweight  INTERVENTION:   -Ensure Enlive po TID, each supplement provides 350 kcal and 20 grams of protein -D/c Juven -MVI with minerals daily -If aggressive care is warranted, may need to consider initiation of enteral nutrition support if this is aligns with pt's goals of care:  Initiate Osmolite 1.2 @ 20 ml/hr and increase by 10 ml every 12 hours to goal rate of 65 ml/hr.   105 ml free water every 4 hours  Tube feeding regimen provides 1872 kcal (100% of needs), 87 grams of protein, and 1189 ml of H2O. Total free water: 1819 ml free water daily   -If feedings are initiated, monitor Mg, K, and Phos daily and replete as needed due to high refeeding risk  NUTRITION DIAGNOSIS:   Severe Malnutrition related to chronic illness (COPD) as evidenced by severe fat depletion, severe muscle depletion, percent weight loss.  GOAL:   Patient will meet greater than or equal to 90% of their needs  MONITOR:   PO intake, Supplement acceptance  REASON FOR ASSESSMENT:   Consult Assessment of nutrition requirement/status  ASSESSMENT:   Pt with medical history significant of seen failure, coronary artery disease, depression, severe protein calorie malnutrition with underweight, GERD, tobacco abuse, hyperlipidemia and hypothyroidism, essential hypertension who presented with progressive shortness of breath cough and wheezing.  Pt admitted with COPD exacerbation.   Reviewed I/O's: +866 ml x 24 hours and +1.2 L since admission  Pt lying in bed at time of visit. Pt on Bi-pap and unable to provide history. No family present. Noted meal tray at bedside, unattempted. Per progress notes, pt has also been drinking alcohol at home.   Reviewed wt hx; pt has experienced a 15% wt loss over the past 6 months, which is significant for time frame. Pt familiar to this RD due to multiple prior  admissions; pt appears much more thin and frail in comparison to previous exams and RD recollection of pt from previous admissions.   Per MD notes, pt desires DNR status. If pt continues to desire aggressive care, may need to consider enteral nutrition support. Recommendations discussed with RN and MD. Per MD notes, pt had long conversation with family yesterday- pt will likely require hospice.   Medications reviewed and include folic acid, miralax, senokot, thiamine, and 0.9% NaCl with KCl 40 mEq/L infusion @ 100 ml/hr.   Labs reviewed: K: 2.9, CBGS: 107 (inpatient orders for glycemic control are none).    NUTRITION - FOCUSED PHYSICAL EXAM:  Flowsheet Row Most Recent Value  Orbital Region Severe depletion  Upper Arm Region Severe depletion  Thoracic and Lumbar Region Severe depletion  Buccal Region Severe depletion  Temple Region Severe depletion  Clavicle Bone Region Severe depletion  Clavicle and Acromion Bone Region Severe depletion  Scapular Bone Region Severe depletion  Dorsal Hand Severe depletion  Patellar Region Severe depletion  Anterior Thigh Region Severe depletion  Posterior Calf Region Severe depletion  Edema (RD Assessment) Mild  Hair Reviewed  Eyes Reviewed  Mouth Reviewed  Skin Reviewed  Nails Reviewed       Diet Order:   Diet Order             Diet regular Room service appropriate? Yes; Fluid consistency: Thin  Diet effective now                   EDUCATION NEEDS:   Not appropriate for education at  this time  Skin:  Skin Assessment: Reviewed RN Assessment  Last BM:  Unknown  Height:   Ht Readings from Last 1 Encounters:  01/10/23 '5\' 4"'$  (1.626 m)    Weight:   Wt Readings from Last 1 Encounters:  01/10/23 45.4 kg    Ideal Body Weight:  54.5 kg  BMI:  Body mass index is 17.16 kg/m.  Estimated Nutritional Needs:   Kcal:  1800-2000  Protein:  90-105 grams  Fluid:  > 1.8 L    Loistine Chance, RD, LDN, [place] Registered Dietitian  II Certified Diabetes Care and Education Specialist Please refer to [facility] for RD and/or RD on-call/weekend/after hours pager

## 2023-01-12 NOTE — Progress Notes (Signed)
PROGRESS NOTE    [name] [facility]   [id number] DOB: 1946-01-24  DOA: 01/10/2023 Date of Service: 01/12/23 PCP: Albina Billet, MD     Brief Narrative / Hospital Course:  [name] is a 77 y.o. female with medical history significant of seen failure, coronary artery disease, depression, severe protein calorie malnutrition with underweight, GERD, tobacco abuse, hyperlipidemia and hypothyroidism, essential hypertension who presented to the ER with progressive shortness of breath cough and wheezing.  Patient is normally on 3 L of oxygen per minute at home.   02/21: In the ER oxygen sat was in the 80s on her 3 L.  Patient placed on BiPAP. Chest x-ray showed no acute findings. Acute viral screen was negative for flu COVID and other viruses. Patient appears to have acute on chronic respiratory failure with hypoxia and hypercarbia secondary to COPD exacerbation. No identified trigger. Admitted to hospitalist.  02/22: Pulled BiPap overnight but saturating better on 2L [place]. In AM, RN reports significant agitation, patient yelling into the hallway questioning if we are killing her. Per chart review, similar behaviors in the past w/ concern for dementia and chronic benzodiazepine use plus alcohol use. Lengthy discussion w/ her daughter in law (HCPOA per advanced directive on file), will d/w family re: code status, she also states patient has been drinking, will start CIWA. Patient does not have adequate capacity to make decisions. Will keep on O2 as best as we can. Family has opted for DNR.  02/23: this morning continued agitation, not keeping BiPap on and also resistant to [place], tachycardic/tachypneic but saturating 88-99% on 4L/min [place]. D/w HCPOA Shawn, will get palliative care to see patient   Consultants:  none  Procedures: none      ASSESSMENT & PLAN:   Principal Problem:   Acute on chronic respiratory failure with hypoxemia (HCC) Active Problems:   COPD exacerbation (HCC)    Hypokalemia   Essential hypertension   Underweight   Hypothyroidism   Dyslipidemia   HLD (hyperlipidemia)  Acute on chronic respiratory failure with hypoxia and hypercarbia COPD exacerbation - unknown trigger / suspect Rx nonadherence BiPAP as needed IV steroid Antibiotics w/ ceftriaxone  Bronchodilators scheduled and albuterol prn  Given agitation / cognitive impairment, patient not assenting to O2 therapies w/ [place]/BiPAP, poor prognosis if unable to recover without NIV  Palliative consult re [place] - goal per Raquel Sarna is for home if possible. Pt is historically uncooperative with home health therapists/nurses, patient does not eat much, just "does what she wants" so comfort goals seem reasonable w/ plan for if/when clinical decline.    Hypokalemia  Replaced Monitor BMP  Alcohol use CIWA protocol  Anxiety Benzodiazepine dependence holding home benzodiazepines (alprazolam) while on CIWA will use lorazepam Will require long taper outpatient to get off this Rx    Severe protein calorie malnutrition:  Dietary counseling   Hyperlipidemia:  Continue statin   Hypothyroidism:  Continue levothyroxine   Tobacco abuse:  Tobacco cessation counseling with nicotine patch   DVT prophylaxis: lovenox Pertinent IV fluids/nutrition: no continuous fluids  Central lines / invasive devices: none  Code Status: DNR   Current Admission Status: inpatient   TOC needs / Dispo plan: may need placement, PT/OT eval pending  Barriers to discharge / significant pending items: clinical improvement, anticipate will be here another 1-2 days              Subjective / Brief ROS:  Patient reports short of breath and is worried we are trying to  kill her "with all the tubes."   Family Communication: Lengthy discussion w/ her daughter in law [name] ([place] per advanced directive on file), see above. [name] is very understanding of [name] situation and prognosis and is open to discuss  further w/ palliative     Objective Findings:  Vitals:   01/12/23 0813 01/12/23 0839 01/12/23 1320 01/12/23 1439  BP: (!) 176/100   (!) 163/92  Pulse: (!) 121 (!) 122  (!) 118  Resp: (!) 22 (!) 33  18  Temp: 98.1 F (36.7 C)   98.2 F (36.8 C)  TempSrc:      SpO2: 99% 99% 95% (!) 83%  Weight:      Height:        Intake/Output Summary (Last 24 hours) at 01/12/2023 1453 Last data filed at 01/12/2023 1230 Gross per 24 hour  Intake 2731.22 ml  Output --  Net 2731.22 ml   Filed Weights   01/10/23 2307  Weight: 45.4 kg    Examination:  Physical Exam Constitutional:      Appearance: She is ill-appearing.  Cardiovascular:     Rate and Rhythm: Normal rate and regular rhythm.  Pulmonary:     Effort: Respiratory distress present.     Breath sounds: Wheezing present.  Musculoskeletal:     Right lower leg: No edema.     Left lower leg: No edema.  Skin:    General: Skin is dry.     Coloration: Skin is pale.  Neurological:     General: No focal deficit present.     Mental Status: She is alert. Mental status is at baseline. She is disoriented.  Psychiatric:     Comments: paranoid          Scheduled Medications:   amLODipine  5 mg Oral Daily   atorvastatin  10 mg Oral Daily   enoxaparin (LOVENOX) injection  40 mg Subcutaneous Q24H   feeding supplement  237 mL Oral TID BM   folic acid  1 mg Oral Daily   ipratropium-albuterol  3 mL Nebulization Q6H   levothyroxine  50 mcg Oral Q0600   metoprolol tartrate  50 mg Oral BID   multivitamin with minerals  1 tablet Oral Daily   nicotine  21 mg Transdermal Daily   polyethylene glycol  17 g Oral Daily   predniSONE  40 mg Oral Q breakfast   senna-docusate  2 tablet Oral QHS   thiamine  100 mg Oral Daily    Continuous Infusions:  0.9 % NaCl with KCl 40 mEq / L 100 mL/hr at 01/12/23 1000   cefTRIAXone (ROCEPHIN)  IV Stopped (01/12/23 0504)    PRN Medications:  albuterol, LORazepam, LORazepam **OR** LORazepam,  traZODone  Antimicrobials from admission:  Anti-infectives (From admission, onward)    Start     Dose/Rate Route Frequency Ordered Stop   01/11/23 0415  cefTRIAXone (ROCEPHIN) 1 g in sodium chloride 0.9 % 100 mL IVPB        1 g 200 mL/hr over 30 Minutes Intravenous Every 24 hours 01/11/23 0407 01/16/23 0414           Data Reviewed:  I have personally reviewed the following...  CBC: Recent Labs  Lab 01/10/23 2314 01/11/23 0536  WBC 6.4 5.1  NEUTROABS 4.0  --   HGB 11.2* 11.0*  HCT 38.4 37.3  MCV 93.2 92.8  PLT 255 [id number]   Basic Metabolic Panel: Recent Labs  Lab 01/10/23 2314 01/11/23 0536  NA 142  --  K 2.9*  --   CL 98  --   CO2 36*  --   GLUCOSE 110*  --   BUN 11  --   CREATININE 0.64 0.58  CALCIUM 8.5*  --   MG 1.7  --    GFR: Estimated Creatinine Clearance: 43.5 mL/min (by C-G formula based on SCr of 0.58 mg/dL). Liver Function Tests: Recent Labs  Lab 01/10/23 2314  AST 17  ALT 12  ALKPHOS 82  BILITOT 0.8  PROT 7.1  ALBUMIN 3.5   No results for input(s): "LIPASE", "AMYLASE" in the last 168 hours. No results for input(s): "AMMONIA" in the last 168 hours. Coagulation Profile: No results for input(s): "INR", "PROTIME" in the last 168 hours. Cardiac Enzymes: No results for input(s): "CKTOTAL", "CKMB", "CKMBINDEX", "TROPONINI" in the last 168 hours. BNP (last 3 results) No results for input(s): "PROBNP" in the last 8760 hours. HbA1C: No results for input(s): "HGBA1C" in the last 72 hours. CBG: No results for input(s): "GLUCAP" in the last 168 hours. Lipid Profile: No results for input(s): "CHOL", "HDL", "LDLCALC", "TRIG", "CHOLHDL", "LDLDIRECT" in the last 72 hours. Thyroid Function Tests: No results for input(s): "TSH", "T4TOTAL", "FREET4", "T3FREE", "THYROIDAB" in the last 72 hours. Anemia Panel: No results for input(s): "VITAMINB12", "FOLATE", "FERRITIN", "TIBC", "IRON", "RETICCTPCT" in the last 72 hours. Most Recent Urinalysis On File:      Component Value Date/Time   COLORURINE YELLOW (A) 11/08/2022 1252   APPEARANCEUR HAZY (A) 11/08/2022 1252   LABSPEC 1.021 11/08/2022 1252   PHURINE 6.0 11/08/2022 1252   GLUCOSEU NEGATIVE 11/08/2022 1252   HGBUR SMALL (A) 11/08/2022 1252   BILIRUBINUR NEGATIVE 11/08/2022 1252   KETONESUR 5 (A) 11/08/2022 1252   PROTEINUR 30 (A) 11/08/2022 1252   NITRITE NEGATIVE 11/08/2022 1252   LEUKOCYTESUR SMALL (A) 11/08/2022 1252   Sepsis Labs: '@LABRCNTIP'$ (procalcitonin:4,lacticidven:4) Microbiology: Recent Results (from the past 240 hour(s))  Resp panel by RT-PCR (RSV, Flu A&B, Covid) Anterior Nasal Swab     Status: None   Collection Time: 01/10/23 11:14 PM   Specimen: Anterior Nasal Swab  Result Value Ref Range Status   SARS Coronavirus 2 by RT PCR NEGATIVE NEGATIVE Final    Comment: (NOTE) SARS-CoV-2 target nucleic acids are NOT DETECTED.  The SARS-CoV-2 RNA is generally detectable in upper respiratory specimens during the acute phase of infection. The lowest concentration of SARS-CoV-2 viral copies this assay can detect is 138 copies/mL. A negative result does not preclude SARS-Cov-2 infection and should not be used as the sole basis for treatment or other patient management decisions. A negative result may occur with  improper specimen collection/handling, submission of specimen other than nasopharyngeal swab, presence of viral mutation(s) within the areas targeted by this assay, and inadequate number of viral copies(<138 copies/mL). A negative result must be combined with clinical observations, patient history, and epidemiological information. The expected result is Negative.  Fact Sheet for Patients:  EntrepreneurPulse.com.au  Fact Sheet for Healthcare Providers:  IncredibleEmployment.be  This test is no t yet approved or cleared by the [place] FDA and  has been authorized for detection and/or diagnosis of SARS-CoV-2 by FDA under an  Emergency Use Authorization (EUA). This EUA will remain  in effect (meaning this test can be used) for the duration of the COVID-19 declaration under Section 564(b)(1) of the Act, 21 U.S.C.section 360bbb-3(b)(1), unless the authorization is terminated  or revoked sooner.       Influenza A by PCR NEGATIVE NEGATIVE Final   Influenza B by  PCR NEGATIVE NEGATIVE Final    Comment: (NOTE) The Xpert Xpress SARS-CoV-2/FLU/RSV plus assay is intended as an aid in the diagnosis of influenza from Nasopharyngeal swab specimens and should not be used as a sole basis for treatment. Nasal washings and aspirates are unacceptable for Xpert Xpress SARS-CoV-2/FLU/RSV testing.  Fact Sheet for Patients: EntrepreneurPulse.com.au  Fact Sheet for Healthcare Providers: IncredibleEmployment.be  This test is not yet approved or cleared by the [place] FDA and has been authorized for detection and/or diagnosis of SARS-CoV-2 by FDA under an Emergency Use Authorization (EUA). This EUA will remain in effect (meaning this test can be used) for the duration of the COVID-19 declaration under Section 564(b)(1) of the Act, 21 U.S.C. section 360bbb-3(b)(1), unless the authorization is terminated or revoked.     Resp Syncytial Virus by PCR NEGATIVE NEGATIVE Final    Comment: (NOTE) Fact Sheet for Patients: EntrepreneurPulse.com.au  Fact Sheet for Healthcare Providers: IncredibleEmployment.be  This test is not yet approved or cleared by the [place] FDA and has been authorized for detection and/or diagnosis of SARS-CoV-2 by FDA under an Emergency Use Authorization (EUA). This EUA will remain in effect (meaning this test can be used) for the duration of the COVID-19 declaration under Section 564(b)(1) of the Act, 21 U.S.C. section 360bbb-3(b)(1), unless the authorization is terminated or revoked.  Performed at [facility], [address] [place], [place] [address]   Culture, blood (routine x 2)     Status: None (Preliminary result)   Collection Time: 01/10/23 11:15 PM   Specimen: BLOOD  Result Value Ref Range Status   Specimen Description BLOOD LEFT ASSIST CONTROL  Final   Special Requests   Final    BOTTLES DRAWN AEROBIC AND ANAEROBIC Blood Culture adequate volume   Culture   Final    NO GROWTH 2 DAYS Performed at [facility], [address] [place], [place] [address]    Report Status PENDING  Incomplete  Culture, blood (routine x 2)     Status: None (Preliminary result)   Collection Time: 01/10/23 11:20 PM   Specimen: BLOOD  Result Value Ref Range Status   Specimen Description BLOOD  LEFT AC  Final   Special Requests   Final    BOTTLES DRAWN AEROBIC AND ANAEROBIC Blood Culture results may not be optimal due to an inadequate volume of blood received in culture bottles   Culture   Final    NO GROWTH 2 DAYS Performed at [facility], [address], [place], [place] [address]    Report Status PENDING  Incomplete  MRSA Next Gen by PCR, Nasal     Status: Abnormal   Collection Time: 01/12/23  3:30 AM   Specimen: Nasal Mucosa; Nasal Swab  Result Value Ref Range Status   MRSA by PCR Next Gen DETECTED (A) NOT DETECTED Final    Comment: RESULT CALLED TO, READ BACK BY AND VERIFIED WITH: ROD INGRAM AT 0909 01/12/23.PMF (NOTE) The GeneXpert MRSA Assay (FDA approved for NASAL specimens only), is one component of a comprehensive MRSA colonization surveillance program. It is not intended to diagnose MRSA infection nor to guide or monitor treatment for MRSA infections. Test performance is not FDA approved in patients less than 41 years old. Performed at [facility], [address] [place], [place] [address]       Radiology Studies last 3 days: [facility] Chest [facility] 1 View  Result Date: 01/11/2023 CLINICAL DATA:  sob EXAM: PORTABLE CHEST 1 VIEW  COMPARISON:  Chest x-ray  11/08/2022, CT chest 01/13/2021 FINDINGS: The heart and mediastinal contours are unchanged. Aortic calcification. No focal consolidation. Chronic coarsened markings with no overt pulmonary edema. No pleural effusion. No pneumothorax. No acute osseous abnormality. IMPRESSION: 1. No active disease. 2.  Aortic Atherosclerosis (ICD10-I70.0). Electronically Signed   By: Iven Finn M.D.   On: 01/11/2023 00:17             LOS: 1 day    Time spent: 50 min    Emeterio Reeve, DO [facility] 01/12/2023, 2:53 PM    Dictation software may have been used to generate the above note. Typos may occur and escape review in typed/dictated notes. Please contact Dr Sheppard Coil directly for clarity if needed.  Staff may message me via secure chat in [place]  but this may not receive an immediate response,  please page me for urgent matters!  If 7PM-7AM, please contact night coverage [email or web address]

## 2023-01-12 NOTE — TOC Progression Note (Addendum)
Transition of Care [facility]) - Progression Note    Patient Details  Name: [name] MRN: [id number] Date of Birth: 02-17-1946  Transition of Care [facility]) CM/SW [place], LCSW Phone Number: 01/12/2023, 10:31 AM  Clinical Narrative: Patient is not fully oriented today. Called daughter-in-law, [name], introduced role, and explained that therapy recommendations would be discussed. Daughter-in-law is willing to try home health but said she often refuses it. Left message for [facility] liaison to see if they can accept. DME recommendation for 3-in-1. Per therapy evals patient already has a shower seat and daughter-in-law said she already has a BSC. Daughter-in-law is unsure who will pick her up at discharge. It will depend on the day. No further concerns. CSW encouraged patient's daughter-in-law to contact CSW as needed. CSW will continue to follow patient and her daughter-in-law for support and facilitate return home once stable.    2:40 pm: [facility] is unable to accept. [facility] accepted referral.  Expected Discharge Plan: Home/Self Care Barriers to Discharge: Continued Medical Work up  Expected Discharge Plan and Services                                               Social Determinants of Health (SDOH) Interventions SDOH Screenings   Depression (PHQ2-9): Low Risk  (07/13/2021)  Tobacco Use: High Risk (01/11/2023)    Readmission Risk Interventions    07/05/2022   10:50 AM 11/25/2021    9:50 AM 01/14/2021    8:49 AM  Readmission Risk Prevention Plan  Transportation Screening Complete Complete   PCP or Specialist Appt within 3-5 Days   Complete  HRI or [place]   Complete  Social Work Consult for [place] Planning/Counseling   Complete  Palliative Care Screening   Not Applicable  Medication Review Press photographer) Complete Complete Complete  PCP or Specialist appointment within 3-5 days of discharge  Complete   HRI or [place] Complete Complete   SW Recovery Care/Counseling Consult Complete    [place] Not Applicable Not Applicable

## 2023-01-13 DIAGNOSIS — J9621 Acute and chronic respiratory failure with hypoxia: Secondary | ICD-10-CM | POA: Diagnosis not present

## 2023-01-13 LAB — BASIC METABOLIC PANEL
Anion gap: 12 (ref 5–15)
BUN: 9 mg/dL (ref 8–23)
CO2: 32 mmol/L (ref 22–32)
Calcium: 8.5 mg/dL — ABNORMAL LOW (ref 8.9–10.3)
Chloride: 97 mmol/L — ABNORMAL LOW (ref 98–111)
Creatinine, Ser: 0.55 mg/dL (ref 0.44–1.00)
GFR, Estimated: >60 mL/min (ref >60)
Glucose, Bld: 121 mg/dL — ABNORMAL HIGH (ref 70–99)
Potassium: 4.4 mmol/L (ref 3.5–5.1)
Sodium: 141 mmol/L (ref 135–145)

## 2023-01-13 MED ORDER — CHLORHEXIDINE GLUCONATE CLOTH 2 % EX PADS
6.0000 | MEDICATED_PAD | Freq: Every day | CUTANEOUS | Status: DC
  Administered 2023-01-13 – 2023-01-15 (×2): 6 via TOPICAL

## 2023-01-13 MED ORDER — PHENOL 1.4 % MT LIQD
1.0000 | OROMUCOSAL | Status: DC | PRN
  Filled 2023-01-13: qty 177

## 2023-01-13 MED ORDER — MUPIROCIN 2 % EX OINT
1.0000 | TOPICAL_OINTMENT | Freq: Two times a day (BID) | CUTANEOUS | Status: DC
  Administered 2023-01-13 – 2023-01-15 (×3): 1 via NASAL
  Filled 2023-01-13: qty 22

## 2023-01-13 NOTE — Progress Notes (Signed)
PROGRESS NOTE    [name]   [id number] DOB: [birth year]  DOA: 01/10/2023 Date of Service: 01/13/23 PCP: Albina Billet, MD     Brief Narrative / Hospital Course:  [name] is a 77 y.o. female with medical history significant of seen failure, coronary artery disease, depression, severe protein calorie malnutrition with underweight, GERD, tobacco abuse, hyperlipidemia and hypothyroidism, essential hypertension who presented to the ER with progressive shortness of breath cough and wheezing.  Patient is normally on 3 L of oxygen per minute at home.   02/21: In the ER oxygen sat was in the 80s on her 3 L.  Patient placed on BiPAP. Chest x-ray showed no acute findings. Acute viral screen was negative for flu COVID and other viruses. Patient appears to have acute on chronic respiratory failure with hypoxia and hypercarbia secondary to COPD exacerbation. No identified trigger. Admitted to hospitalist.  02/22: Pulled BiPap overnight but saturating better on 2L [place]. In AM, RN reports significant agitation, patient yelling into the hallway questioning if we are killing her. Per chart review, similar behaviors in the past w/ concern for dementia and chronic benzodiazepine use plus alcohol use. Lengthy discussion w/ her daughter in law (HCPOA per advanced directive on file), will d/w family re: code status, she also states patient has been drinking, will start CIWA. Patient does not have adequate capacity to make decisions. Will keep on O2 as best as we can. Family has opted for DNR.  02/23: this morning continued agitation, not keeping BiPap on and also resistant to [place], tachycardic/tachypneic but saturating 88-99% on 4L/min [place]. D/w Leary Roca, will get palliative care to see patient  02/24: pending palliative (today is Saturday), maintaining on 6L [place]. Tachycardia/tachypnea have improved.   Consultants:  none  Procedures: none      ASSESSMENT & PLAN:   Principal Problem:   Acute on  chronic respiratory failure with hypoxemia (HCC) Active Problems:   COPD exacerbation (HCC)   Hypokalemia   Essential hypertension   Underweight   Hypothyroidism   Dyslipidemia   HLD (hyperlipidemia)  Acute on chronic respiratory failure with hypoxia and hypercarbia COPD exacerbation - unknown trigger / suspect Rx nonadherence BiPAP as needed IV steroid Antibiotics w/ ceftriaxone  Bronchodilators scheduled and albuterol prn  Given agitation / cognitive impairment, patient not assenting to O2 therapies w/ [place]/BiPAP, poor prognosis if unable to recover without NIV  Palliative consult re [place] - goal per Raquel Sarna is for home if possible. Pt is historically uncooperative with home health therapists/nurses, patient does not eat much, just "does what she wants" so comfort goals seem reasonable w/ plan for if/when clinical decline.    Hypokalemia  Replaced Monitor BMP  Alcohol use CIWA protocol  Anxiety Benzodiazepine dependence holding home benzodiazepines (alprazolam) while on CIWA will use lorazepam Will require long taper outpatient to get off this Rx    Severe protein calorie malnutrition:  Dietary counseling   Hyperlipidemia:  Continue statin   Hypothyroidism:  Continue levothyroxine   Tobacco abuse:  Tobacco cessation counseling with nicotine patch   DVT prophylaxis: lovenox Pertinent IV fluids/nutrition: no continuous fluids  Central lines / invasive devices: none  Code Status: DNR   Current Admission Status: inpatient   TOC needs / Dispo plan: may need placement, PT/OT eval pending  Barriers to discharge / significant pending items: clinical improvement, anticipate will be here another 1-2 days, palliative care              Subjective /  Brief ROS:  Patient reports short of breath and is worried we are trying to kill her "with all the tubes." Same as yesterday. She is worried about getting home to her cats.   Family Communication: Lengthy discussion w/  her daughter in law [name] ([place] per advanced directive on file) yesterday, see above. [name] is very understanding of [name] situation and prognosis and is open to discuss further w/ palliative     Objective Findings:  Vitals:   01/13/23 0758 01/13/23 0923 01/13/23 1235 01/13/23 1356  BP:  (!) 159/95 134/87   Pulse:  79 86   Resp:  16 18   Temp:  98.6 F (37 C) 98.4 F (36.9 C)   TempSrc:      SpO2: 97% 100% 100% 99%  Weight:      Height:        Intake/Output Summary (Last 24 hours) at 01/13/2023 1414 Last data filed at 01/13/2023 1357 Gross per 24 hour  Intake 1959.56 ml  Output 1200 ml  Net 759.56 ml   Filed Weights   01/10/23 2307  Weight: 45.4 kg    Examination:  Physical Exam Constitutional:      Appearance: She is ill-appearing.  Cardiovascular:     Rate and Rhythm: Normal rate and regular rhythm.  Pulmonary:     Effort: Respiratory distress present.     Breath sounds: Wheezing present.  Musculoskeletal:     Right lower leg: No edema.     Left lower leg: No edema.  Skin:    General: Skin is dry.     Coloration: Skin is pale.  Neurological:     General: No focal deficit present.     Mental Status: She is alert. Mental status is at baseline. She is disoriented.  Psychiatric:     Comments: paranoid          Scheduled Medications:   amLODipine  5 mg Oral Daily   atorvastatin  10 mg Oral Daily   Chlorhexidine Gluconate Cloth  6 each Topical Q0600   enoxaparin (LOVENOX) injection  40 mg Subcutaneous Q24H   feeding supplement  237 mL Oral TID BM   folic acid  1 mg Oral Daily   ipratropium-albuterol  3 mL Nebulization Q6H   levothyroxine  50 mcg Oral Q0600   metoprolol tartrate  50 mg Oral BID   multivitamin with minerals  1 tablet Oral Daily   mupirocin ointment  1 Application Nasal BID   nicotine  21 mg Transdermal Daily   polyethylene glycol  17 g Oral Daily   predniSONE  40 mg Oral Q breakfast   senna-docusate  2 tablet Oral QHS    thiamine  100 mg Oral Daily    Continuous Infusions:  0.9 % NaCl with KCl 40 mEq / L 100 mL/hr at 01/13/23 1100   cefTRIAXone (ROCEPHIN)  IV Stopped (01/13/23 0412)    PRN Medications:  albuterol, LORazepam, LORazepam **OR** LORazepam, phenol, traZODone  Antimicrobials from admission:  Anti-infectives (From admission, onward)    Start     Dose/Rate Route Frequency Ordered Stop   01/11/23 0415  cefTRIAXone (ROCEPHIN) 1 g in sodium chloride 0.9 % 100 mL IVPB        1 g 200 mL/hr over 30 Minutes Intravenous Every 24 hours 01/11/23 0407 01/16/23 0414           Data Reviewed:  I have personally reviewed the following...  CBC: Recent Labs  Lab 01/10/23 2314 01/11/23 0536  WBC 6.4 5.1  NEUTROABS 4.0  --   HGB 11.2* 11.0*  HCT 38.4 37.3  MCV 93.2 92.8  PLT 255 [id number]   Basic Metabolic Panel: Recent Labs  Lab 01/10/23 2314 01/11/23 0536  NA 142  --   K 2.9*  --   CL 98  --   CO2 36*  --   GLUCOSE 110*  --   BUN 11  --   CREATININE 0.64 0.58  CALCIUM 8.5*  --   MG 1.7  --    GFR: Estimated Creatinine Clearance: 43.5 mL/min (by C-G formula based on SCr of 0.58 mg/dL). Liver Function Tests: Recent Labs  Lab 01/10/23 2314  AST 17  ALT 12  ALKPHOS 82  BILITOT 0.8  PROT 7.1  ALBUMIN 3.5   No results for input(s): "LIPASE", "AMYLASE" in the last 168 hours. No results for input(s): "AMMONIA" in the last 168 hours. Coagulation Profile: No results for input(s): "INR", "PROTIME" in the last 168 hours. Cardiac Enzymes: No results for input(s): "CKTOTAL", "CKMB", "CKMBINDEX", "TROPONINI" in the last 168 hours. BNP (last 3 results) No results for input(s): "PROBNP" in the last 8760 hours. HbA1C: No results for input(s): "HGBA1C" in the last 72 hours. CBG: No results for input(s): "GLUCAP" in the last 168 hours. Lipid Profile: No results for input(s): "CHOL", "HDL", "LDLCALC", "TRIG", "CHOLHDL", "LDLDIRECT" in the last 72 hours. Thyroid Function Tests: No  results for input(s): "TSH", "T4TOTAL", "FREET4", "T3FREE", "THYROIDAB" in the last 72 hours. Anemia Panel: No results for input(s): "VITAMINB12", "FOLATE", "FERRITIN", "TIBC", "IRON", "RETICCTPCT" in the last 72 hours. Most Recent Urinalysis On File:     Component Value Date/Time   COLORURINE YELLOW (A) 11/08/2022 1252   APPEARANCEUR HAZY (A) 11/08/2022 1252   LABSPEC 1.021 11/08/2022 1252   PHURINE 6.0 11/08/2022 1252   GLUCOSEU NEGATIVE 11/08/2022 1252   HGBUR SMALL (A) 11/08/2022 1252   BILIRUBINUR NEGATIVE 11/08/2022 1252   KETONESUR 5 (A) 11/08/2022 1252   PROTEINUR 30 (A) 11/08/2022 1252   NITRITE NEGATIVE 11/08/2022 1252   LEUKOCYTESUR SMALL (A) 11/08/2022 1252   Sepsis Labs: '@LABRCNTIP'$ (procalcitonin:4,lacticidven:4) Microbiology: Recent Results (from the past 240 hour(s))  Resp panel by RT-PCR (RSV, Flu A&B, Covid) Anterior Nasal Swab     Status: None   Collection Time: 01/10/23 11:14 PM   Specimen: Anterior Nasal Swab  Result Value Ref Range Status   SARS Coronavirus 2 by RT PCR NEGATIVE NEGATIVE Final    Comment: (NOTE) SARS-CoV-2 target nucleic acids are NOT DETECTED.  The SARS-CoV-2 RNA is generally detectable in upper respiratory specimens during the acute phase of infection. The lowest concentration of SARS-CoV-2 viral copies this assay can detect is 138 copies/mL. A negative result does not preclude SARS-Cov-2 infection and should not be used as the sole basis for treatment or other patient management decisions. A negative result may occur with  improper specimen collection/handling, submission of specimen other than nasopharyngeal swab, presence of viral mutation(s) within the areas targeted by this assay, and inadequate number of viral copies(<138 copies/mL). A negative result must be combined with clinical observations, patient history, and epidemiological information. The expected result is Negative.  Fact Sheet for Patients:   EntrepreneurPulse.com.au  Fact Sheet for Healthcare Providers:  IncredibleEmployment.be  This test is no t yet approved or cleared by the [place] FDA and  has been authorized for detection and/or diagnosis of SARS-CoV-2 by FDA under an Emergency Use Authorization (EUA). This EUA will remain  in effect (meaning this test can  be used) for the duration of the COVID-19 declaration under Section 564(b)(1) of the Act, 21 U.S.C.section 360bbb-3(b)(1), unless the authorization is terminated  or revoked sooner.       Influenza A by PCR NEGATIVE NEGATIVE Final   Influenza B by PCR NEGATIVE NEGATIVE Final    Comment: (NOTE) The Xpert Xpress SARS-CoV-2/FLU/RSV plus assay is intended as an aid in the diagnosis of influenza from Nasopharyngeal swab specimens and should not be used as a sole basis for treatment. Nasal washings and aspirates are unacceptable for Xpert Xpress SARS-CoV-2/FLU/RSV testing.  Fact Sheet for Patients: EntrepreneurPulse.com.au  Fact Sheet for Healthcare Providers: IncredibleEmployment.be  This test is not yet approved or cleared by the [place] FDA and has been authorized for detection and/or diagnosis of SARS-CoV-2 by FDA under an Emergency Use Authorization (EUA). This EUA will remain in effect (meaning this test can be used) for the duration of the COVID-19 declaration under Section 564(b)(1) of the Act, 21 U.S.C. section 360bbb-3(b)(1), unless the authorization is terminated or revoked.     Resp Syncytial Virus by PCR NEGATIVE NEGATIVE Final    Comment: (NOTE) Fact Sheet for Patients: EntrepreneurPulse.com.au  Fact Sheet for Healthcare Providers: IncredibleEmployment.be  This test is not yet approved or cleared by the [place] FDA and has been authorized for detection and/or diagnosis of SARS-CoV-2 by FDA under an Emergency Use  Authorization (EUA). This EUA will remain in effect (meaning this test can be used) for the duration of the COVID-19 declaration under Section 564(b)(1) of the Act, 21 U.S.C. section 360bbb-3(b)(1), unless the authorization is terminated or revoked.  Performed at [facility], [address] [place], [place] [address]   Culture, blood (routine x 2)     Status: None (Preliminary result)   Collection Time: 01/10/23 11:15 PM   Specimen: BLOOD  Result Value Ref Range Status   Specimen Description BLOOD LEFT ASSIST CONTROL  Final   Special Requests   Final    BOTTLES DRAWN AEROBIC AND ANAEROBIC Blood Culture adequate volume   Culture   Final    NO GROWTH 3 DAYS Performed at [facility] [address] [place], [place] [address]    Report Status PENDING  Incomplete  Culture, blood (routine x 2)     Status: None (Preliminary result)   Collection Time: 01/10/23 11:20 PM   Specimen: BLOOD  Result Value Ref Range Status   Specimen Description BLOOD  LEFT AC  Final   Special Requests   Final    BOTTLES DRAWN AEROBIC AND ANAEROBIC Blood Culture results may not be optimal due to an inadequate volume of blood received in culture bottles   Culture   Final    NO GROWTH 3 DAYS Performed at [facility], [address] [place], [place] [address]    Report Status PENDING  Incomplete  MRSA Next Gen by PCR, Nasal     Status: Abnormal   Collection Time: 01/12/23  3:30 AM   Specimen: Nasal Mucosa; Nasal Swab  Result Value Ref Range Status   MRSA by PCR Next Gen DETECTED (A) NOT DETECTED Final    Comment: RESULT CALLED TO, READ BACK BY AND VERIFIED WITH: ROD INGRAM AT 0909 01/12/23.PMF (NOTE) The GeneXpert MRSA Assay (FDA approved for NASAL specimens only), is one component of a comprehensive MRSA colonization surveillance program. It is not intended to diagnose MRSA infection nor to guide or monitor treatment for MRSA infections. Test performance is not FDA  approved in patients  less than 60 years old. Performed at [facility], [address] [place], [place] [address]       Radiology Studies last 3 days: [facility]  Result Date: 01/11/2023 CLINICAL DATA:  sob EXAM: PORTABLE CHEST 1 VIEW COMPARISON:  Chest x-ray 11/08/2022, CT chest 01/13/2021 FINDINGS: The heart and mediastinal contours are unchanged. Aortic calcification. No focal consolidation. Chronic coarsened markings with no overt pulmonary edema. No pleural effusion. No pneumothorax. No acute osseous abnormality. IMPRESSION: 1. No active disease. 2.  Aortic Atherosclerosis (ICD10-I70.0). Electronically Signed   By: Iven Finn M.D.   On: 01/11/2023 00:17             LOS: 2 days       Emeterio Reeve, DO [facility] 01/13/2023, 2:14 PM    Dictation software may have been used to generate the above note. Typos may occur and escape review in typed/dictated notes. Please contact Dr Sheppard Coil directly for clarity if needed.  Staff may message me via secure chat in [place]  but this may not receive an immediate response,  please page me for urgent matters!  If 7PM-7AM, please contact night coverage [email or web address]

## 2023-01-13 NOTE — Progress Notes (Signed)
Pt confused & reluctant to complete svn

## 2023-01-14 DIAGNOSIS — J9621 Acute and chronic respiratory failure with hypoxia: Secondary | ICD-10-CM | POA: Diagnosis not present

## 2023-01-14 MED ORDER — LORAZEPAM 2 MG/ML IJ SOLN
2.0000 mg | Freq: Once | INTRAMUSCULAR | Status: AC
  Administered 2023-01-14: 2 mg via INTRAVENOUS
  Filled 2023-01-14: qty 1

## 2023-01-14 MED ORDER — CALCIUM CARBONATE ANTACID 500 MG PO CHEW
1.0000 | CHEWABLE_TABLET | Freq: Three times a day (TID) | ORAL | Status: DC | PRN
  Administered 2023-01-15: 200 mg via ORAL
  Filled 2023-01-14 (×2): qty 1

## 2023-01-14 MED ORDER — IPRATROPIUM-ALBUTEROL 0.5-2.5 (3) MG/3ML IN SOLN
3.0000 mL | Freq: Three times a day (TID) | RESPIRATORY_TRACT | Status: DC
  Administered 2023-01-14 – 2023-01-15 (×3): 3 mL via RESPIRATORY_TRACT
  Filled 2023-01-14 (×3): qty 3

## 2023-01-14 MED ORDER — PANTOPRAZOLE SODIUM 40 MG PO TBEC
40.0000 mg | DELAYED_RELEASE_TABLET | Freq: Two times a day (BID) | ORAL | Status: DC
  Administered 2023-01-14 – 2023-01-15 (×3): 40 mg via ORAL
  Filled 2023-01-14 (×3): qty 1

## 2023-01-14 NOTE — Progress Notes (Signed)
PROGRESS NOTE    [name] [facility]   [id number] DOB: 09-03-1946  DOA: 01/10/2023 Date of Service: 01/14/23 PCP: Albina Billet, MD     Brief Narrative / Brown Course:  [name] is a 77 y.o. female with medical history significant of seen failure, coronary artery disease, depression, severe protein calorie malnutrition with underweight, GERD, tobacco abuse, hyperlipidemia and hypothyroidism, essential hypertension who presented to the ER with progressive shortness of breath cough and wheezing.  Patient is normally on 3 L of oxygen per minute at home.   02/21: In the ER oxygen sat was in the 80s on her 3 L.  Patient placed on BiPAP. Chest x-ray showed no acute findings. Acute viral screen was negative for flu COVID and other viruses. Patient appears to have acute on chronic respiratory failure with hypoxia and hypercarbia secondary to COPD exacerbation. No identified trigger. Admitted to hospitalist.  02/22: Pulled BiPap overnight but saturating better on 2L [place]. In AM, RN reports significant agitation, patient yelling into the hallway questioning if we are killing her. Per chart review, similar behaviors in the past w/ concern for dementia and chronic benzodiazepine use plus alcohol use. Lengthy discussion w/ her daughter in law (HCPOA per advanced directive on file), will d/w family re: code status, she also states patient has been drinking, will start CIWA. Patient does not have adequate capacity to make decisions. Will keep on O2 as best as we can. Family has opted for DNR.  02/23: this morning continued agitation, not keeping BiPap on and also resistant to [place], tachycardic/tachypneic but saturating 88-99% on 4L/min [place]. D/w [place], will get palliative care to see patient  02/24: pending palliative (today is Saturday), maintaining on 6L [place]. Tachycardia/tachypnea have improved.  02/25: maintaining on 4L O2 [place]. Daughter in law [name]) would like to hold discharge until palliative care  team can assess tomorrow - given readmission risk I think this is appropriate to establish [place] and would have a strong consideration for hospice. Will reach out to palliative early tomorrow (today is Sunday)    Consultants:  none  Procedures: none      ASSESSMENT & PLAN:   Principal Problem:   Acute on chronic respiratory failure with hypoxemia (HCC) Active Problems:   COPD exacerbation (HCC)   Hypokalemia   Essential hypertension   Underweight   Hypothyroidism   Dyslipidemia   HLD (hyperlipidemia)  Acute on chronic respiratory failure with hypoxia and hypercarbia COPD exacerbation - unknown trigger / suspect Rx nonadherence BiPAP as needed IV steroid Antibiotics w/ ceftriaxone  Bronchodilators scheduled and albuterol prn  Given agitation / cognitive impairment, patient not assenting to O2 therapies w/ [name]/BiPAP, poor prognosis if unable to recover without NIV  Palliative consult re [place] - goal per [name] is for home if possible. Pt is historically uncooperative with home health therapists/nurses, patient does not eat much, just "does what she wants" Alison comfort goals seem reasonable w/ plan for if/when clinical decline.    Hypokalemia  Replaced Monitor BMP  Alcohol use CIWA protocol  Anxiety Benzodiazepine dependence holding home benzodiazepines (alprazolam) while on CIWA will use lorazepam Will require long taper outpatient to get off this Rx    Severe protein calorie malnutrition:  Dietary counseling   Hyperlipidemia:  Continue statin   Hypothyroidism:  Continue levothyroxine   Tobacco abuse:  Tobacco cessation counseling with nicotine patch   DVT prophylaxis: lovenox Pertinent IV fluids/nutrition: no continuous fluids  Central lines / invasive devices: none  Code  Status: DNR   Current Admission Status: inpatient   TOC needs / Dispo plan: pending palliative consult Barriers to discharge / significant pending items: palliative care  consult             Subjective / Brief ROS:  Patient reports short of breath and is worried we are trying to kill her "with all the tubes." Same as yesterday. She is worried about getting home to her cats.   Family Communication: Phone discussion w/ her daughter in law [name] ([place] per advanced directive on file) would like to hold discharge until palliative care team can assess tomorrow - given readmission risk I think this is appropriate to establish [place] and would have a strong consideration for hospice. Will reach out to palliative early tomorrow (today is Sunday)    Objective Findings:  Vitals:   01/14/23 0721 01/14/23 0756 01/14/23 1134 01/14/23 1358  BP: (!) 142/82  (!) 163/79   Pulse: 97  85   Resp: 16  16   Temp: 97.6 F (36.4 C)  98 F (36.7 C)   TempSrc:      SpO2: 100% 100% 95% 96%  Weight:      Height:        Intake/Output Summary (Last 24 hours) at 01/14/2023 1516 Last data filed at 01/14/2023 0320 Gross per 24 hour  Intake 220 ml  Output 900 ml  Net -680 ml   Filed Weights   01/10/23 2307  Weight: 45.4 kg    Examination:  Physical Exam Constitutional:      Appearance: She is ill-appearing.  Cardiovascular:     Rate and Rhythm: Normal rate and regular rhythm.  Pulmonary:     Effort: Pulmonary effort is normal. No respiratory distress.     Breath sounds: Wheezing present.  Musculoskeletal:     Right lower leg: No edema.     Left lower leg: No edema.  Skin:    General: Skin is dry.     Coloration: Skin is pale.  Neurological:     General: No focal deficit present.     Mental Status: She is alert. Mental status is at baseline. She is disoriented.  Psychiatric:     Comments: paranoid          Scheduled Medications:   amLODipine  5 mg Oral Daily   atorvastatin  10 mg Oral Daily   Chlorhexidine Gluconate Cloth  6 each Topical Q0600   enoxaparin (LOVENOX) injection  40 mg Subcutaneous Q24H   feeding supplement  237 mL Oral TID  BM   folic acid  1 mg Oral Daily   ipratropium-albuterol  3 mL Nebulization Q6H   levothyroxine  50 mcg Oral Q0600   metoprolol tartrate  50 mg Oral BID   multivitamin with minerals  1 tablet Oral Daily   mupirocin ointment  1 Application Nasal BID   nicotine  21 mg Transdermal Daily   pantoprazole  40 mg Oral BID AC   polyethylene glycol  17 g Oral Daily   predniSONE  40 mg Oral Q breakfast   senna-docusate  2 tablet Oral QHS   thiamine  100 mg Oral Daily    Continuous Infusions:  cefTRIAXone (ROCEPHIN)  IV 1 g (01/14/23 0320)    PRN Medications:  albuterol, calcium carbonate, LORazepam, phenol, traZODone  Antimicrobials from admission:  Anti-infectives (From admission, onward)    Start     Dose/Rate Route Frequency Ordered Stop   01/11/23 0415  cefTRIAXone (ROCEPHIN) 1 g  in sodium chloride 0.9 % 100 mL IVPB        1 g 200 mL/hr over 30 Minutes Intravenous Every 24 hours 01/11/23 0407 01/16/23 0414           Data Reviewed:  I have personally reviewed the following...  CBC: Recent Labs  Lab 01/10/23 2314 01/11/23 0536  WBC 6.4 5.1  NEUTROABS 4.0  --   HGB 11.2* 11.0*  HCT 38.4 37.3  MCV 93.2 92.8  PLT 255 [id number]   Basic Metabolic Panel: Recent Labs  Lab 01/10/23 2314 01/11/23 0536 01/13/23 1443  NA 142  --  141  K 2.9*  --  4.4  CL 98  --  97*  CO2 36*  --  32  GLUCOSE 110*  --  121*  BUN 11  --  9  CREATININE 0.64 0.58 0.55  CALCIUM 8.5*  --  8.5*  MG 1.7  --   --    GFR: Estimated Creatinine Clearance: 43.5 mL/min (by C-G formula based on SCr of 0.55 mg/dL). Liver Function Tests: Recent Labs  Lab 01/10/23 2314  AST 17  ALT 12  ALKPHOS 82  BILITOT 0.8  PROT 7.1  ALBUMIN 3.5   No results for input(s): "LIPASE", "AMYLASE" in the last 168 hours. No results for input(s): "AMMONIA" in the last 168 hours. Coagulation Profile: No results for input(s): "INR", "PROTIME" in the last 168 hours. Cardiac Enzymes: No results for input(s):  "CKTOTAL", "CKMB", "CKMBINDEX", "TROPONINI" in the last 168 hours. BNP (last 3 results) No results for input(s): "PROBNP" in the last 8760 hours. HbA1C: No results for input(s): "HGBA1C" in the last 72 hours. CBG: No results for input(s): "GLUCAP" in the last 168 hours. Lipid Profile: No results for input(s): "CHOL", "HDL", "LDLCALC", "TRIG", "CHOLHDL", "LDLDIRECT" in the last 72 hours. Thyroid Function Tests: No results for input(s): "TSH", "T4TOTAL", "FREET4", "T3FREE", "THYROIDAB" in the last 72 hours. Anemia Panel: No results for input(s): "VITAMINB12", "FOLATE", "FERRITIN", "TIBC", "IRON", "RETICCTPCT" in the last 72 hours. Most Recent Urinalysis On File:     Component Value Date/Time   COLORURINE YELLOW (A) 11/08/2022 1252   APPEARANCEUR HAZY (A) 11/08/2022 1252   LABSPEC 1.021 11/08/2022 1252   PHURINE 6.0 11/08/2022 1252   GLUCOSEU NEGATIVE 11/08/2022 1252   HGBUR SMALL (A) 11/08/2022 1252   BILIRUBINUR NEGATIVE 11/08/2022 1252   KETONESUR 5 (A) 11/08/2022 1252   PROTEINUR 30 (A) 11/08/2022 1252   NITRITE NEGATIVE 11/08/2022 1252   LEUKOCYTESUR SMALL (A) 11/08/2022 1252   Sepsis Labs: '@LABRCNTIP'$ (procalcitonin:4,lacticidven:4) Microbiology: Recent Results (from the past 240 hour(s))  Resp panel by RT-PCR (RSV, Flu A&B, Covid) Anterior Nasal Swab     Status: None   Collection Time: 01/10/23 11:14 PM   Specimen: Anterior Nasal Swab  Result Value Ref Range Status   SARS Coronavirus 2 by RT PCR NEGATIVE NEGATIVE Final    Comment: (NOTE) SARS-CoV-2 target nucleic acids are NOT DETECTED.  The SARS-CoV-2 RNA is generally detectable in upper respiratory specimens during the acute phase of infection. The lowest concentration of SARS-CoV-2 viral copies this assay can detect is 138 copies/mL. A negative result does not preclude SARS-Cov-2 infection and should not be used as the sole basis for treatment or other patient management decisions. A negative result may occur with   improper specimen collection/handling, submission of specimen other than nasopharyngeal swab, presence of viral mutation(s) within the areas targeted by this assay, and inadequate number of viral copies(<138 copies/mL). A negative result must be combined  with clinical observations, patient history, and epidemiological information. The expected result is Negative.  Fact Sheet for Patients:  EntrepreneurPulse.com.au  Fact Sheet for Healthcare Providers:  IncredibleEmployment.be  This test is no t yet approved or cleared by the [place] FDA and  has been authorized for detection and/or diagnosis of SARS-CoV-2 by FDA under an Emergency Use Authorization (EUA). This EUA will remain  in effect (meaning this test can be used) for the duration of the COVID-19 declaration under Section 564(b)(1) of the Act, 21 U.S.C.section 360bbb-3(b)(1), unless the authorization is terminated  or revoked sooner.       Influenza A by PCR NEGATIVE NEGATIVE Final   Influenza B by PCR NEGATIVE NEGATIVE Final    Comment: (NOTE) The Xpert Xpress SARS-CoV-2/FLU/RSV plus assay is intended as an aid in the diagnosis of influenza from Nasopharyngeal swab specimens and should not be used as a sole basis for treatment. Nasal washings and aspirates are unacceptable for Xpert Xpress SARS-CoV-2/FLU/RSV testing.  Fact Sheet for Patients: EntrepreneurPulse.com.au  Fact Sheet for Healthcare Providers: IncredibleEmployment.be  This test is not yet approved or cleared by the [place] FDA and has been authorized for detection and/or diagnosis of SARS-CoV-2 by FDA under an Emergency Use Authorization (EUA). This EUA will remain in effect (meaning this test can be used) for the duration of the COVID-19 declaration under Section 564(b)(1) of the Act, 21 U.S.C. section 360bbb-3(b)(1), unless the authorization is terminated or revoked.      Resp Syncytial Virus by PCR NEGATIVE NEGATIVE Final    Comment: (NOTE) Fact Sheet for Patients: EntrepreneurPulse.com.au  Fact Sheet for Healthcare Providers: IncredibleEmployment.be  This test is not yet approved or cleared by the [place] FDA and has been authorized for detection and/or diagnosis of SARS-CoV-2 by FDA under an Emergency Use Authorization (EUA). This EUA will remain in effect (meaning this test can be used) for the duration of the COVID-19 declaration under Section 564(b)(1) of the Act, 21 U.S.C. section 360bbb-3(b)(1), unless the authorization is terminated or revoked.  Performed at [facility], [address] [place], [place] [address]   Culture, blood (routine x 2)     Status: None (Preliminary result)   Collection Time: 01/10/23 11:15 PM   Specimen: BLOOD  Result Value Ref Range Status   Specimen Description BLOOD LEFT ASSIST CONTROL  Final   Special Requests   Final    BOTTLES DRAWN AEROBIC AND ANAEROBIC Blood Culture adequate volume   Culture   Final    NO GROWTH 3 DAYS Performed at [facility], [address], [place], [place] [address]    Report Status PENDING  Incomplete  Culture, blood (routine x 2)     Status: None (Preliminary result)   Collection Time: 01/10/23 11:20 PM   Specimen: BLOOD  Result Value Ref Range Status   Specimen Description BLOOD  LEFT AC  Final   Special Requests   Final    BOTTLES DRAWN AEROBIC AND ANAEROBIC Blood Culture results may not be optimal due to an inadequate volume of blood received in culture bottles   Culture   Final    NO GROWTH 3 DAYS Performed at [facility], [address], [place], [place] [address]    Report Status PENDING  Incomplete  MRSA Next Gen by PCR, Nasal     Status: Abnormal   Collection Time: 01/12/23  3:30 AM   Specimen: Nasal Mucosa; Nasal Swab  Result Value Ref Range Status   MRSA by  PCR Next Gen DETECTED (A) NOT  DETECTED Final    Comment: RESULT CALLED TO, READ BACK BY AND VERIFIED WITH: ROD INGRAM AT 0909 01/12/23.PMF (NOTE) The GeneXpert MRSA Assay (FDA approved for NASAL specimens only), is one component of a comprehensive MRSA colonization surveillance program. It is not intended to diagnose MRSA infection nor to guide or monitor treatment for MRSA infections. Test performance is not FDA approved in patients less than 4 years old. Performed at [facility], [address] [place],  [address]       Radiology Studies last 3 days: [facility] Chest [facility] 1 View  Result Date: 01/11/2023 CLINICAL DATA:  sob EXAM: PORTABLE CHEST 1 VIEW COMPARISON:  Chest x-ray 11/08/2022, CT chest 01/13/2021 FINDINGS: The heart and mediastinal contours are unchanged. Aortic calcification. No focal consolidation. Chronic coarsened markings with no overt pulmonary edema. No pleural effusion. No pneumothorax. No acute osseous abnormality. IMPRESSION: 1. No active disease. 2.  Aortic Atherosclerosis (ICD10-I70.0). Electronically Signed   By: Iven Finn M.D.   On: 01/11/2023 00:17             LOS: 3 days       Emeterio Reeve, DO [facility] 01/14/2023, 3:16 PM    Dictation software may have been used to generate the above note. Typos may occur and escape review in typed/dictated notes. Please contact Dr Sheppard Coil directly for clarity if needed.  Staff may message me via secure chat in [place]  but this may not receive an immediate response,  please page me for urgent matters!  If 7PM-7AM, please contact night coverage [email or web address]

## 2023-01-14 NOTE — Progress Notes (Signed)
Pt currently sitting at the foot of the recliner, refusing to move into the chair or transfer to bed for safety, pt removed oxygen tubing and refuses to put it back on.  Pt SPO2 currently at 85% on room air with labored breathing, using accessory muscles.  Pt medicated with PRN ativan, call placed to notify MD.

## 2023-01-15 DIAGNOSIS — Z515 Encounter for palliative care: Secondary | ICD-10-CM | POA: Diagnosis not present

## 2023-01-15 DIAGNOSIS — Z66 Do not resuscitate: Secondary | ICD-10-CM

## 2023-01-15 DIAGNOSIS — J9621 Acute and chronic respiratory failure with hypoxia: Secondary | ICD-10-CM | POA: Diagnosis not present

## 2023-01-15 DIAGNOSIS — R627 Adult failure to thrive: Secondary | ICD-10-CM

## 2023-01-15 DIAGNOSIS — J441 Chronic obstructive pulmonary disease with (acute) exacerbation: Secondary | ICD-10-CM | POA: Diagnosis not present

## 2023-01-15 LAB — CULTURE, BLOOD (ROUTINE X 2)
Culture: NO GROWTH
Culture: NO GROWTH
Special Requests: ADEQUATE

## 2023-01-15 NOTE — Consult Note (Signed)
Consultation Note Date: 01/15/2023   Patient Name: [name]  DOB: [birth year]  MRN: [id number]  Age / Sex: 77 y.o., female  PCP: Albina Billet, MD Referring Physician: Emeterio Reeve, DO  Reason for Consultation: Establishing goals of care and Psychosocial/spiritual support  HPI/Patient Profile: 77 y.o. female   admitted on 01/10/2023 with past  medical history significant for  coronary artery disease, depression, severe protein calorie malnutrition with underweight, GERD, tobacco abuse, hyperlipidemia and hypothyroidism, essential hypertension who presented to the ER with progressive shortness of breath cough and wheezing.  Patient is normally on 3 L of oxygen per minute at home.    02/21: In the ER oxygen sat was in the 80s on her 3 L.  Patient placed on BiPAP. Chest x-ray showed no acute findings. Acute viral screen was negative for flu COVID and other viruses. Patient appears to have acute on chronic respiratory failure with hypoxia and hypercarbia secondary to COPD exacerbation. No identified trigger. Admitted for treatment and stabilization 02/22: Pulled BiPap overnight but saturating better on 2L [place]. In AM, RN reports significant agitation, patient yelling into the hallway questioning if we are killing her. Per chart review, similar behaviors in the past w/ concern for dementia and chronic benzodiazepine use plus alcohol use. Lengthy discussion w/ her daughter in law (HCPOA per advanced directive on file), will d/w family re: code status, she also states patient has been drinking, will start CIWA. Patient does not have adequate capacity to make decisions. Will keep on O2 as best as we can. Family has opted for DNR.  02/23: this morning continued agitation, not keeping BiPap on and also resistant to [place], tachycardic/tachypneic but saturating 88-99% on 4L/min [place]. D/w HCPOA Shawn 02/24: pending palliative,  maintaining on 6L [place]. Tachycardia/tachypnea have improved.  02/25: maintaining on 4L O2 [place]. Daughter in law [name]) would like to hold discharge until palliative care team can assess tomorrow - given readmission risk I think this is appropriate to establish [place] and would have a strong consideration for hospice.  01/15/23 Palliative Medicine Consult   Patient and family face treatment option decisions, advanced directive decisions and anticipatory care needs.   Clinical Assessment and Goals of Care:   Currently patient is oriented to person and place but has limited insight into her overall medical situation and ending advance care decisions.  She wishes for me to talk with her grandson.  This NP Wadie Lessen reviewed medical records, received report from team, assessed the patient and then spoke to [name]/  HPOA and POA/ [facility] by telephone  to discuss diagnosis, prognosis, GOC, EOL wishes disposition and options.    Concept of Palliative Care was introduced as specialized medical care for people and their families living with serious illness.  If focuses on providing relief from the symptoms and stress of a serious illness.  The goal is to improve quality of life for both the patient and the family. Values and goals of care important to patient and family were attempted to  be elicited.  Created space and opportunity for  family to explore thoughts and feelings regarding current medical situation.  Family verbalize their frustration and working with and and "trying to help her" for the last 15 years.  She lives alone, has out-of-pocket caregivers usually 2 times a day for an hour or 2.  The caregivers bring her food.  Family tell me that patient refuses most offered help, she does not want help with bathing and at times can be combative.  They describe that she "lives on the sofa".  They believe that she continues to consume alcohol, however they are not sure who exactly brings it into her.    She does have a small dog who lives in her home "[place]".    There is been discussion around transition to an assisted living situation, however that is always refused.     A  discussion was had today regarding advanced directives.   Concepts specific to code status, artifical feeding and hydration, continued IV antibiotics and rehospitalization was had.    The difference between a aggressive medical intervention path  and a palliative comfort care path for this patient at this time was had.    We discussed the fact that patient's with capacity can make poor choices for themselves.  Patient does have the right to refuse transfer to the hospital  Education offered on hospice benefit; philosophy and eligibility   Natural trajectory and expectations at EOL were discussed.    Questions and concerns addressed.  Patient/family   encouraged to call with questions or concerns.     PMT will continue to support holistically.       HCPOA is [name]  Document noted in [place] Planning: DNR    Additional Recommendations (Limitations, Scope, Preferences): Avoid Hospitalization,  Psycho-social/Spiritual:  Desire for further Chaplaincy support:no Additional Recommendations: Education on Hospice  Prognosis:  Unable to determine, high risk for decompensation and hospital  re-admission   Discharge Planning:   Patient is discharge home today Home with Home Health      Primary Diagnoses: Present on Admission:  COPD exacerbation ([place])  Essential hypertension  Underweight  Hypothyroidism  Dyslipidemia  HLD (hyperlipidemia)  Acute on chronic respiratory failure with hypoxemia (HCC)  Hypokalemia   I have reviewed the medical record, interviewed the patient and family, and examined the patient. The following aspects are pertinent.  Past Medical History:  Diagnosis Date   Anxiety    CAD (coronary artery disease)     Carpal tunnel syndrome    Cataract    Complication of anesthesia    has woken up at times   COPD (chronic obstructive pulmonary disease) (HCC)    CRP elevated 09/14/2015   Depression    Difficult intubation    Dyslipidemia    Dyspnea    DOE   Dysrhythmia    hx palpatations   Elevated sedimentation rate 09/14/2015   Esophageal spasm    Gastrointestinal parasites    GERD (gastroesophageal reflux disease)    HCAP (healthcare-associated pneumonia) 10/22/2018   Hiatal hernia    History of peptic ulcer disease    Hypertension    Hyperthyroidism    Hypothyroidism    Low magnesium levels 09/14/2015   Pelvic fracture ([place]) 2008   fall from riding a horse   Peripheral vascular disease ([place])    Reflux    Rotator cuff injury    Sepsis ([place]) 10/22/2018  Stenosis, spinal, lumbar    Social History   Socioeconomic History   Marital status: Divorced    Spouse name: Not on file   Number of children: Not on file   Years of education: Not on file   Highest education level: Not on file  Occupational History   Occupation: retired Air cabin crew  Tobacco Use   Smoking status: Some Days    Packs/day: 0.00    Types: Cigarettes   Smokeless tobacco: Never   Tobacco comments:    pt states 2 cigs per month   Substance and Sexual Activity   Alcohol use: No   Drug use: No   Sexual activity: Never  Other Topics Concern   Not on file  Social History Narrative   Not on file   Social Determinants of Health   Financial Resource Strain: Not on file  Food Insecurity: Not on file  Transportation Needs: Not on file  Physical Activity: Not on file  Stress: Not on file  Social Connections: Not on file   Family History  Problem Relation Age of Onset   Aneurysm Other    Scheduled Meds:  amLODipine  5 mg Oral Daily   atorvastatin  10 mg Oral Daily   Chlorhexidine Gluconate Cloth  6 each Topical Q0600   enoxaparin (LOVENOX) injection  40 mg Subcutaneous Q24H   feeding supplement  237 mL  Oral TID BM   folic acid  1 mg Oral Daily   ipratropium-albuterol  3 mL Nebulization TID   levothyroxine  50 mcg Oral Q0600   metoprolol tartrate  50 mg Oral BID   multivitamin with minerals  1 tablet Oral Daily   mupirocin ointment  1 Application Nasal BID   nicotine  21 mg Transdermal Daily   pantoprazole  40 mg Oral BID AC   polyethylene glycol  17 g Oral Daily   predniSONE  40 mg Oral Q breakfast   senna-docusate  2 tablet Oral QHS   thiamine  100 mg Oral Daily   Continuous Infusions: PRN Meds:.albuterol, calcium carbonate, LORazepam, phenol, traZODone Medications Prior to Admission:  Prior to Admission medications   Medication Sig Start Date End Date Taking? Authorizing Provider  albuterol (VENTOLIN HFA) 108 (90 Base) MCG/ACT inhaler Inhale 1-2 puffs into the lungs every 4 (four) hours as needed for shortness of breath or wheezing. 06/28/22  Yes [provider]  ALPRAZolam Duanne Moron) 1 MG tablet Take 1 mg by mouth 3 (three) times daily as needed. 10/06/21  Yes [provider]  ammonium lactate (LAC-HYDRIN) 12 % lotion APPLY TO AFFECTED AREA AS NEEDED FOR DRY SKIN 01/02/23  Yes Felipa Furnace, DPM  ASPIRIN LOW DOSE 81 MG tablet Take 81 mg by mouth daily. 12/29/22  Yes [provider]  azithromycin (ZITHROMAX) 250 MG tablet Take 250 mg by mouth as directed. Take 1 tablet (250 mg total) by mouth every Monday, Wednesday, and Friday for 91 days 12/27/22 03/28/23 Yes [provider]  COMBIVENT RESPIMAT 20-100 MCG/ACT AERS respimat Inhale 1-2 puffs into the lungs 4 (four) times daily. 11/25/21  Yes Wieting, Richard, MD  levothyroxine (SYNTHROID) 50 MCG tablet Take 50 mcg by mouth every morning. 08/09/22  Yes [provider]  Multiple Vitamin (MULTIVITAMIN WITH MINERALS) TABS tablet Take 1 tablet by mouth daily. 11/10/22  Yes Lorella Nimrod, MD  omeprazole (PRILOSEC) 40 MG capsule Take 40 mg by mouth daily. 12/06/22  Yes [provider]  traZODone  (DESYREL) 150 MG tablet Take 300 mg  by mouth at bedtime. 09/22/21  Yes [provider]  amLODipine (NORVASC) 5 MG tablet Take 1 tablet (5 mg total) by mouth daily. Patient not taking: Reported on 01/11/2023 11/10/22   Lorella Nimrod, MD  atorvastatin (LIPITOR) 10 MG tablet Take 1 tablet (10 mg total) by mouth daily. Patient not taking: Reported on 01/11/2023 11/10/22 11/10/23  Lorella Nimrod, MD  metoprolol tartrate (LOPRESSOR) 50 MG tablet Take 1 tablet (50 mg total) by mouth 2 (two) times daily. Patient not taking: Reported on 01/11/2023 11/10/22 02/08/23  Lorella Nimrod, MD  nutrition supplement, JUVEN, (JUVEN) PACK Take 1 packet by mouth 2 (two) times daily between meals. Patient not taking: Reported on 06/29/2022 11/25/21   Loletha Grayer, MD  polyethylene glycol (MIRALAX / GLYCOLAX) 17 g packet Take 17 g by mouth daily. Patient not taking: Reported on 06/29/2022 11/25/21   Loletha Grayer, MD  senna-docusate (SENOKOT-S) 8.6-50 MG tablet Take 2 tablets by mouth at bedtime. Patient not taking: Reported on 06/29/2022 11/25/21   Loletha Grayer, MD  thiamine (VITAMIN B-1) 100 MG tablet Take 1 tablet (100 mg total) by mouth daily. 11/10/22   Lorella Nimrod, MD   No Known Allergies Review of Systems  All other systems reviewed and are negative.   Physical Exam Constitutional:      Appearance: She is underweight. She is ill-appearing.  Cardiovascular:     Rate and Rhythm: Normal rate.  Skin:    General: Skin is warm and dry.  Neurological:     Mental Status: She is alert.     Comments: Oriented to person and place, poor insight into medical condition and pending decisions      Vital Signs: BP 131/81 (BP Location: Left Arm)   Pulse (!) 101   Temp 98.4 F (36.9 C) (Oral)   Resp 20   Ht '5\' 4"'$  (1.626 m)   Wt 45.4 kg   SpO2 97%   BMI 17.16 kg/m  Pain Scale: 0-10   Pain Score: 0-No pain   SpO2: SpO2: 97 % O2 Device:SpO2: 97 % O2 Flow Rate: .O2 Flow Rate (L/min): 3 L/min  IO:  Intake/output summary:  Intake/Output Summary (Last 24 hours) at 01/15/2023 1047 Last data filed at 01/15/2023 0411 Gross per 24 hour  Intake 340 ml  Output 1100 ml  Net -760 ml    LBM: Last BM Date : 01/12/23 Baseline Weight: Weight: 45.4 kg Most recent weight: Weight: 45.4 kg     Palliative Assessment/Data:   40 %      Time: 70 minutes  Review of medical records, medically appropriate exam, discussed with treatment team, counseling and education to family specific to hospice care, clinical documentation review of medications and coordination of care  Discussed with Dr. Sheppard Coil and bedsdie RN    Signed by: Wadie Lessen, NP   Please contact Palliative Medicine Team phone at [phone] for questions and concerns.  For individual provider: See Shea Evans

## 2023-01-15 NOTE — TOC Progression Note (Signed)
Transition of Care [facility]) - Progression Note    Patient Details  Name: [name] MRN: [id number] Date of Birth: 11/18/1946  Transition [facility]) CM/SW Contact  Laurena Slimmer, RN Phone Number: 01/15/2023, 1:58 PM  Clinical Narrative:    Spoke with patient's relativef, Shawn. Patient is discharging home. Care will continue with hospice. Shawn stated patient's caregiver, Yvetta Coder is comoing to pick her up. Shawn advised Burley Saver needs to bring patient's home oxygen tank. Shawn stated she was not sureif they were charged. She will call this RNCM back. Shawn advised EMS may need to be arranged.    Expected Discharge Plan: Home/Self Care Barriers to Discharge: Continued Medical Work up  Expected Discharge Plan and Services         Expected Discharge Date: 01/15/23                                     Social Determinants of Health (SDOH) Interventions SDOH Screenings   Depression (PHQ2-9): Low Risk  (07/13/2021)  Tobacco Use: High Risk (01/11/2023)    Readmission Risk Interventions    07/05/2022   10:50 AM 11/25/2021    9:50 AM 01/14/2021    8:49 AM  Readmission Risk Prevention Plan  Transportation Screening Complete Complete   PCP or Specialist Appt within 3-5 Days   Complete  HRI or [place]   Complete  Social Work Consult for [place] Planning/Counseling   Complete  Palliative Care Screening   Not Applicable  Medication Review Press photographer) Complete Complete Complete  PCP or Specialist appointment within 3-5 days of discharge  Complete   HRI or [place] Complete Complete   SW Recovery Care/Counseling Consult Complete    [place] Not Applicable Not Applicable

## 2023-01-15 NOTE — Care Management Important Message (Signed)
Important Message  Patient Details  Name: Corky Tanenbaum MRN: [id number] Date of Birth: July 02, 1946   Medicare Important Message Given:  Other (see comment)  Left message with [name], DIL, at [phone] to review Medicare IM.  Encouraged callback.     Dannette Barbara 01/15/2023, 2:02 PM

## 2023-01-15 NOTE — Discharge Summary (Signed)
Physician Discharge Summary   Patient: [name] MRN: [id number]  DOB: 01-03-1946   Admit:     Date of Admission: 01/10/2023 Admitted from: home   Discharge: Date of discharge: 01/15/23 Disposition: Home Condition at discharge: fair  CODE STATUS: DNR     Discharge Physician: Emeterio Reeve, DO [facility]     PCP: Albina Billet, MD  Recommendations for Outpatient Follow-up:  Follow up with PCP Albina Billet, MD in 1-2 weeks Please obtain labs/tests: CBC, BMP in 1-2 weeks Please follow up on the following pending results: none PCP AND OTHER OUTPATIENT PROVIDERS: SEE BELOW FOR SPECIFIC DISCHARGE INSTRUCTIONS PRINTED FOR PATIENT IN ADDITION TO GENERIC AVS PATIENT INFO    Discharge Instructions     Diet - low sodium heart healthy   Complete by: As directed    Increase activity slowly   Complete by: As directed          Discharge Diagnoses: Principal Problem:   Acute on chronic respiratory failure with hypoxemia ([place]) Active Problems:   COPD exacerbation ([place])   Hypokalemia   Essential hypertension   Underweight   Hypothyroidism   Dyslipidemia   HLD (hyperlipidemia)       Hospital Course: [name] is a 77 y.o. female with medical history significant of seen failure, coronary artery disease, depression, severe protein calorie malnutrition with underweight, GERD, tobacco abuse, hyperlipidemia and hypothyroidism, essential hypertension who presented to the ER with progressive shortness of breath cough and wheezing.  Patient is normally on 3 L of oxygen per minute at home.   02/21: In the ER oxygen sat was in the 80s on her 3 L.  Patient placed on BiPAP. Chest x-ray showed no acute findings. Acute viral screen was negative for flu COVID and other viruses. Patient appears to have acute on chronic respiratory failure with hypoxia and hypercarbia secondary to COPD exacerbation. No identified trigger. Admitted to hospitalist.  02/22: Pulled  BiPap overnight but saturating better on 2L [place]. In AM, RN reports significant agitation, patient yelling into the hallway questioning if we are killing her. Per chart review, similar behaviors in the past w/ concern for dementia and chronic benzodiazepine use plus alcohol use. Lengthy discussion w/ her daughter in law (HCPOA per advanced directive on file), will d/w family re: code status, she also states patient has been drinking, will start CIWA. Patient does not have adequate capacity to make decisions. Will keep on O2 as best as we can. Family has opted for DNR.  02/23: this morning continued agitation, not keeping BiPap on and also resistant to [place], tachycardic/tachypneic but saturating 88-99% on 4L/min [place]. D/w [place], will get palliative care to see patient  02/24: pending palliative (today is Saturday), maintaining on 6L [place]. Tachycardia/tachypnea have improved.  02/25: maintaining on 4L O2 [place]. Daughter in law [facility]) would like to hold discharge until palliative care team can assess tomorrow - given readmission risk I think this is appropriate to establish [place] and would have a strong consideration for hospice. Will reach out to palliative early tomorrow (today is Sunday)   02/26: palliative care spoke w/ Shawn HPOA, pt to d/c home on her baseline O2, GOC discussion - see palliative notes, no hospice at this time but hospice services were d/w HPOA   Consultants:  Palliative Care   Procedures: none      ASSESSMENT & PLAN:   Principal Problem:   Acute on chronic respiratory failure with hypoxemia ([place]) Active  Problems:   COPD exacerbation ([facility])   Hypokalemia   Essential hypertension   Underweight   Hypothyroidism   Dyslipidemia   HLD (hyperlipidemia)  Acute on chronic respiratory failure with hypoxia and hypercarbia COPD exacerbation - unknown trigger / suspect Rx nonadherence Finishes steroids today Antibiotics w/ ceftriaxone - completed  Bronchodilators scheduled and  albuterol prn --> resume home meds  Palliative consult re [place] - goal per Raquel Sarna is for home if possible. Pt is historically uncooperative with home health therapists/nurses, patient does not eat much, just "does what she wants" so comfort goals with eventual hospice services seem reasonable - HPOA aware of options, pt going home at this time w/ her usual caregiver services.    Hypokalemia  Replaced Monitor BMP outpatient   Alcohol use Abstinence encouraged   Anxiety Benzodiazepine dependence Resume home meds  Will require long taper outpatient to get off this Rx - defer to PCP   Severe protein calorie malnutrition:  Dietary counseling   Hyperlipidemia:  Continue statin   Hypothyroidism:  Continue levothyroxine   Tobacco abuse:  Tobacco cessation counseling  nicotine patch            Discharge Instructions  Allergies as of 01/15/2023   No Known Allergies      Medication List     TAKE these medications    albuterol 108 (90 Base) MCG/ACT inhaler Commonly known as: VENTOLIN HFA Inhale 1-2 puffs into the lungs every 4 (four) hours as needed for shortness of breath or wheezing.   ALPRAZolam 1 MG tablet Commonly known as: XANAX Take 1 mg by mouth 3 (three) times daily as needed.   amLODipine 5 MG tablet Commonly known as: NORVASC Take 1 tablet (5 mg total) by mouth daily.   ammonium lactate 12 % lotion Commonly known as: LAC-HYDRIN APPLY TO AFFECTED AREA AS NEEDED FOR DRY SKIN   Aspirin Low Dose 81 MG tablet Generic drug: aspirin EC Take 81 mg by mouth daily.   atorvastatin 10 MG tablet Commonly known as: Lipitor Take 1 tablet (10 mg total) by mouth daily.   azithromycin 250 MG tablet Commonly known as: ZITHROMAX Take 250 mg by mouth as directed. Take 1 tablet (250 mg total) by mouth every Monday, Wednesday, and Friday for 91 days   Combivent Respimat 20-100 MCG/ACT Aers respimat Generic drug: Ipratropium-Albuterol Inhale 1-2 puffs into the lungs  4 (four) times daily.   levothyroxine 50 MCG tablet Commonly known as: SYNTHROID Take 50 mcg by mouth every morning.   metoprolol tartrate 50 MG tablet Commonly known as: LOPRESSOR Take 1 tablet (50 mg total) by mouth 2 (two) times daily.   multivitamin with minerals Tabs tablet Take 1 tablet by mouth daily.   nutrition supplement (JUVEN) Pack Take 1 packet by mouth 2 (two) times daily between meals.   omeprazole 40 MG capsule Commonly known as: PRILOSEC Take 40 mg by mouth daily.   polyethylene glycol 17 g packet Commonly known as: MIRALAX / GLYCOLAX Take 17 g by mouth daily.   senna-docusate 8.6-50 MG tablet Commonly known as: Senokot-S Take 2 tablets by mouth at bedtime.   thiamine 100 MG tablet Commonly known as: Vitamin B-1 Take 1 tablet (100 mg total) by mouth daily.   traZODone 150 MG tablet Commonly known as: DESYREL Take 300 mg by mouth at bedtime.         Follow-up Information     [facility].   Why: They will follow up with you  for your home health needs. Contact information: [place] [address] [phone]                 No Known Allergies   Subjective: pt reports no concerns today, wants to go home, feels like she is breathing fine.    Discharge Exam: BP 131/81 (BP Location: Left Arm)   Pulse (!) 101   Temp 98.4 F (36.9 C) (Oral)   Resp 20   Ht '5\' 4"'$  (1.626 m)   Wt 45.4 kg   SpO2 97%   BMI 17.16 kg/m  General: Pt is alert, awake, not in acute distress Cardiovascular: RRR, S1/S2 +, no rubs, no gallops Respiratory: diffuse wheeze and coarse breath sounds bilaterally - stable from yesterdays exam, improved compared to few days ago  Abdominal: Soft, NT, ND, bowel sounds + Extremities: no edema, no cyanosis     The results of significant diagnostics from this hospitalization (including imaging, microbiology, ancillary and laboratory) are listed below for reference.      Microbiology: Recent Results (from the past 240 hour(s))  Resp panel by RT-PCR (RSV, Flu A&B, Covid) Anterior Nasal Swab     Status: None   Collection Time: 01/10/23 11:14 PM   Specimen: Anterior Nasal Swab  Result Value Ref Range Status   SARS Coronavirus 2 by RT PCR NEGATIVE NEGATIVE Final    Comment: (NOTE) SARS-CoV-2 target nucleic acids are NOT DETECTED.  The SARS-CoV-2 RNA is generally detectable in upper respiratory specimens during the acute phase of infection. The lowest concentration of SARS-CoV-2 viral copies this assay can detect is 138 copies/mL. A negative result does not preclude SARS-Cov-2 infection and should not be used as the sole basis for treatment or other patient management decisions. A negative result may occur with  improper specimen collection/handling, submission of specimen other than nasopharyngeal swab, presence of viral mutation(s) within the areas targeted by this assay, and inadequate number of viral copies(<138 copies/mL). A negative result must be combined with clinical observations, patient history, and epidemiological information. The expected result is Negative.  Fact Sheet for Patients:  EntrepreneurPulse.com.au  Fact Sheet for Healthcare Providers:  IncredibleEmployment.be  This test is no t yet approved or cleared by the [place] FDA and  has been authorized for detection and/or diagnosis of SARS-CoV-2 by FDA under an Emergency Use Authorization (EUA). This EUA will remain  in effect (meaning this test can be used) for the duration of the COVID-19 declaration under Section 564(b)(1) of the Act, 21 U.S.C.section 360bbb-3(b)(1), unless the authorization is terminated  or revoked sooner.       Influenza A by PCR NEGATIVE NEGATIVE Final   Influenza B by PCR NEGATIVE NEGATIVE Final    Comment: (NOTE) The Xpert Xpress SARS-CoV-2/FLU/RSV plus assay is intended as an aid in the diagnosis of  influenza from Nasopharyngeal swab specimens and should not be used as a sole basis for treatment. Nasal washings and aspirates are unacceptable for Xpert Xpress SARS-CoV-2/FLU/RSV testing.  Fact Sheet for Patients: EntrepreneurPulse.com.au  Fact Sheet for Healthcare Providers: IncredibleEmployment.be  This test is not yet approved or cleared by the [place] FDA and has been authorized for detection and/or diagnosis of SARS-CoV-2 by FDA under an Emergency Use Authorization (EUA). This EUA will remain in effect (meaning this test can be used) for the duration of the COVID-19 declaration under Section 564(b)(1) of the Act, 21 U.S.C. section 360bbb-3(b)(1), unless the authorization is terminated or revoked.     Resp Syncytial  Virus by PCR NEGATIVE NEGATIVE Final    Comment: (NOTE) Fact Sheet for Patients: EntrepreneurPulse.com.au  Fact Sheet for Healthcare Providers: IncredibleEmployment.be  This test is not yet approved or cleared by the [place] FDA and has been authorized for detection and/or diagnosis of SARS-CoV-2 by FDA under an Emergency Use Authorization (EUA). This EUA will remain in effect (meaning this test can be used) for the duration of the COVID-19 declaration under Section 564(b)(1) of the Act, 21 U.S.C. section 360bbb-3(b)(1), unless the authorization is terminated or revoked.  Performed at [facility], [address] [place], [place] [address]   Culture, blood (routine x 2)     Status: None   Collection Time: 01/10/23 11:15 PM   Specimen: BLOOD  Result Value Ref Range Status   Specimen Description BLOOD LEFT ASSIST CONTROL  Final   Special Requests   Final    BOTTLES DRAWN AEROBIC AND ANAEROBIC Blood Culture adequate volume   Culture   Final    NO GROWTH 5 DAYS Performed at [facility], [address] [place], [place] [address]    Report Status  01/15/2023 FINAL  Final  Culture, blood (routine x 2)     Status: None   Collection Time: 01/10/23 11:20 PM   Specimen: BLOOD  Result Value Ref Range Status   Specimen Description BLOOD  LEFT AC  Final   Special Requests   Final    BOTTLES DRAWN AEROBIC AND ANAEROBIC Blood Culture results may not be optimal due to an inadequate volume of blood received in culture bottles   Culture   Final    NO GROWTH 5 DAYS Performed at [facility], [address], [place], [place] [address]    Report Status 01/15/2023 FINAL  Final  MRSA Next Gen by PCR, Nasal     Status: Abnormal   Collection Time: 01/12/23  3:30 AM   Specimen: Nasal Mucosa; Nasal Swab  Result Value Ref Range Status   MRSA by PCR Next Gen DETECTED (A) NOT DETECTED Final    Comment: RESULT CALLED TO, READ BACK BY AND VERIFIED WITH: ROD INGRAM AT 0909 01/12/23.PMF (NOTE) The GeneXpert MRSA Assay (FDA approved for NASAL specimens only), is one component of a comprehensive MRSA colonization surveillance program. It is not intended to diagnose MRSA infection nor to guide or monitor treatment for MRSA infections. Test performance is not FDA approved in patients less than 56 years old. Performed at [facility], [address] [place], [place] [address]      Labs: BNP (last 3 results) Recent Labs    06/29/22 2118 01/10/23 2314  BNP 30.9 123456   Basic Metabolic Panel: Recent Labs  Lab 01/10/23 2314 01/11/23 0536 01/13/23 1443  NA 142  --  141  K 2.9*  --  4.4  CL 98  --  97*  CO2 36*  --  32  GLUCOSE 110*  --  121*  BUN 11  --  9  CREATININE 0.64 0.58 0.55  CALCIUM 8.5*  --  8.5*  MG 1.7  --   --    Liver Function Tests: Recent Labs  Lab 01/10/23 2314  AST 17  ALT 12  ALKPHOS 82  BILITOT 0.8  PROT 7.1  ALBUMIN 3.5   No results for input(s): "LIPASE", "AMYLASE" in the last 168 hours. No results for input(s): "AMMONIA" in the last 168 hours. CBC: Recent Labs  Lab 01/10/23 2314  01/11/23 0536  WBC 6.4 5.1  NEUTROABS  4.0  --   HGB 11.2* 11.0*  HCT 38.4 37.3  MCV 93.2 92.8  PLT 255 264   Cardiac Enzymes: No results for input(s): "CKTOTAL", "CKMB", "CKMBINDEX", "TROPONINI" in the last 168 hours. BNP: Invalid input(s): "POCBNP" CBG: No results for input(s): "GLUCAP" in the last 168 hours. D-Dimer No results for input(s): "DDIMER" in the last 72 hours. Hgb A1c No results for input(s): "HGBA1C" in the last 72 hours. Lipid Profile No results for input(s): "CHOL", "HDL", "LDLCALC", "TRIG", "CHOLHDL", "LDLDIRECT" in the last 72 hours. Thyroid function studies No results for input(s): "TSH", "T4TOTAL", "T3FREE", "THYROIDAB" in the last 72 hours.  Invalid input(s): "FREET3" Anemia work up No results for input(s): "VITAMINB12", "FOLATE", "FERRITIN", "TIBC", "IRON", "RETICCTPCT" in the last 72 hours. Urinalysis    Component Value Date/Time   COLORURINE YELLOW (A) 11/08/2022 1252   APPEARANCEUR HAZY (A) 11/08/2022 1252   LABSPEC 1.021 11/08/2022 1252   PHURINE 6.0 11/08/2022 1252   GLUCOSEU NEGATIVE 11/08/2022 1252   HGBUR SMALL (A) 11/08/2022 1252   BILIRUBINUR NEGATIVE 11/08/2022 1252   KETONESUR 5 (A) 11/08/2022 1252   PROTEINUR 30 (A) 11/08/2022 1252   NITRITE NEGATIVE 11/08/2022 1252   LEUKOCYTESUR SMALL (A) 11/08/2022 1252   Sepsis Labs Recent Labs  Lab 01/10/23 2314 01/11/23 0536  WBC 6.4 5.1   Microbiology Recent Results (from the past 240 hour(s))  Resp panel by RT-PCR (RSV, Flu A&B, Covid) Anterior Nasal Swab     Status: None   Collection Time: 01/10/23 11:14 PM   Specimen: Anterior Nasal Swab  Result Value Ref Range Status   SARS Coronavirus 2 by RT PCR NEGATIVE NEGATIVE Final    Comment: (NOTE) SARS-CoV-2 target nucleic acids are NOT DETECTED.  The SARS-CoV-2 RNA is generally detectable in upper respiratory specimens during the acute phase of infection. The lowest concentration of SARS-CoV-2 viral copies this assay can detect  is 138 copies/mL. A negative result does not preclude SARS-Cov-2 infection and should not be used as the sole basis for treatment or other patient management decisions. A negative result may occur with  improper specimen collection/handling, submission of specimen other than nasopharyngeal swab, presence of viral mutation(s) within the areas targeted by this assay, and inadequate number of viral copies(<138 copies/mL). A negative result must be combined with clinical observations, patient history, and epidemiological information. The expected result is Negative.  Fact Sheet for Patients:  EntrepreneurPulse.com.au  Fact Sheet for Healthcare Providers:  IncredibleEmployment.be  This test is no t yet approved or cleared by the [place] FDA and  has been authorized for detection and/or diagnosis of SARS-CoV-2 by FDA under an Emergency Use Authorization (EUA). This EUA will remain  in effect (meaning this test can be used) for the duration of the COVID-19 declaration under Section 564(b)(1) of the Act, 21 U.S.C.section 360bbb-3(b)(1), unless the authorization is terminated  or revoked sooner.       Influenza A by PCR NEGATIVE NEGATIVE Final   Influenza B by PCR NEGATIVE NEGATIVE Final    Comment: (NOTE) The Xpert Xpress SARS-CoV-2/FLU/RSV plus assay is intended as an aid in the diagnosis of influenza from Nasopharyngeal swab specimens and should not be used as a sole basis for treatment. Nasal washings and aspirates are unacceptable for Xpert Xpress SARS-CoV-2/FLU/RSV testing.  Fact Sheet for Patients: EntrepreneurPulse.com.au  Fact Sheet for Healthcare Providers: IncredibleEmployment.be  This test is not yet approved or cleared by the [place] FDA and has been authorized for detection and/or diagnosis of SARS-CoV-2 by FDA  under an Emergency Use Authorization (EUA). This EUA will remain in effect  (meaning this test can be used) for the duration of the COVID-19 declaration under Section 564(b)(1) of the Act, 21 U.S.C. section 360bbb-3(b)(1), unless the authorization is terminated or revoked.     Resp Syncytial Virus by PCR NEGATIVE NEGATIVE Final    Comment: (NOTE) Fact Sheet for Patients: EntrepreneurPulse.com.au  Fact Sheet for Healthcare Providers: IncredibleEmployment.be  This test is not yet approved or cleared by the [place] FDA and has been authorized for detection and/or diagnosis of SARS-CoV-2 by FDA under an Emergency Use Authorization (EUA). This EUA will remain in effect (meaning this test can be used) for the duration of the COVID-19 declaration under Section 564(b)(1) of the Act, 21 U.S.C. section 360bbb-3(b)(1), unless the authorization is terminated or revoked.  Performed at [facility], [address] [place], [place] [address]   Culture, blood (routine x 2)     Status: None   Collection Time: 01/10/23 11:15 PM   Specimen: BLOOD  Result Value Ref Range Status   Specimen Description BLOOD LEFT ASSIST CONTROL  Final   Special Requests   Final    BOTTLES DRAWN AEROBIC AND ANAEROBIC Blood Culture adequate volume   Culture   Final    NO GROWTH 5 DAYS Performed at [facility], [address] [place], [place] [address]    Report Status 01/15/2023 FINAL  Final  Culture, blood (routine x 2)     Status: None   Collection Time: 01/10/23 11:20 PM   Specimen: BLOOD  Result Value Ref Range Status   Specimen Description BLOOD  LEFT AC  Final   Special Requests   Final    BOTTLES DRAWN AEROBIC AND ANAEROBIC Blood Culture results may not be optimal due to an inadequate volume of blood received in culture bottles   Culture   Final    NO GROWTH 5 DAYS Performed at [facility], [address], [place], [place] [address]    Report Status 01/15/2023 FINAL  Final  MRSA Next Gen by PCR, Nasal      Status: Abnormal   Collection Time: 01/12/23  3:30 AM   Specimen: Nasal Mucosa; Nasal Swab  Result Value Ref Range Status   MRSA by PCR Next Gen DETECTED (A) NOT DETECTED Final    Comment: RESULT CALLED TO, READ BACK BY AND VERIFIED WITH: ROD INGRAM AT 0909 01/12/23.PMF (NOTE) The GeneXpert MRSA Assay (FDA approved for NASAL specimens only), is one component of a comprehensive MRSA colonization surveillance program. It is not intended to diagnose MRSA infection nor to guide or monitor treatment for MRSA infections. Test performance is not FDA approved in patients less than 24 years old. Performed at [facility], [address], [place], [place] [address]    Imaging DG Chest [place] 1 View  Result Date: 01/11/2023 CLINICAL DATA:  sob EXAM: PORTABLE CHEST 1 VIEW COMPARISON:  Chest x-ray 11/08/2022, CT chest 01/13/2021 FINDINGS: The heart and mediastinal contours are unchanged. Aortic calcification. No focal consolidation. Chronic coarsened markings with no overt pulmonary edema. No pleural effusion. No pneumothorax. No acute osseous abnormality. IMPRESSION: 1. No active disease. 2.  Aortic Atherosclerosis (ICD10-I70.0). Electronically Signed   By: Iven Finn M.D.   On: 01/11/2023 00:17      Time coordinating discharge: over 30 minutes  SIGNED:  Emeterio Reeve DO [facility]

## 2023-01-30 ENCOUNTER — Inpatient Hospital Stay
Admission: EM | Admit: 2023-01-30 | Discharge: 2023-02-04 | DRG: 190 | Disposition: A | Discharge status: Home Health | Payer: Medicare HMO | Attending: Hospitalist | Admitting: Hospitalist

## 2023-01-30 ENCOUNTER — Other Ambulatory Visit: Payer: Self-pay

## 2023-01-30 ENCOUNTER — Emergency Department: Payer: Medicare HMO

## 2023-01-30 ENCOUNTER — Encounter: Payer: Self-pay | Admitting: Internal Medicine

## 2023-01-30 DIAGNOSIS — E785 Hyperlipidemia, unspecified: Secondary | ICD-10-CM | POA: Diagnosis present

## 2023-01-30 DIAGNOSIS — F32A Depression, unspecified: Secondary | ICD-10-CM | POA: Diagnosis present

## 2023-01-30 DIAGNOSIS — T502X5A Adverse effect of carbonic-anhydrase inhibitors, benzothiadiazides and other diuretics, initial encounter: Secondary | ICD-10-CM | POA: Diagnosis not present

## 2023-01-30 DIAGNOSIS — Z7952 Long term (current) use of systemic steroids: Secondary | ICD-10-CM

## 2023-01-30 DIAGNOSIS — Z1152 Encounter for screening for COVID-19: Secondary | ICD-10-CM

## 2023-01-30 DIAGNOSIS — J810 Acute pulmonary edema: Secondary | ICD-10-CM | POA: Diagnosis present

## 2023-01-30 DIAGNOSIS — Z79890 Hormone replacement therapy (postmenopausal): Secondary | ICD-10-CM

## 2023-01-30 DIAGNOSIS — E876 Hypokalemia: Secondary | ICD-10-CM | POA: Diagnosis present

## 2023-01-30 DIAGNOSIS — K219 Gastro-esophageal reflux disease without esophagitis: Secondary | ICD-10-CM | POA: Diagnosis present

## 2023-01-30 DIAGNOSIS — Z66 Do not resuscitate: Secondary | ICD-10-CM | POA: Diagnosis present

## 2023-01-30 DIAGNOSIS — Z9841 Cataract extraction status, right eye: Secondary | ICD-10-CM

## 2023-01-30 DIAGNOSIS — K224 Dyskinesia of esophagus: Secondary | ICD-10-CM | POA: Diagnosis present

## 2023-01-30 DIAGNOSIS — Z961 Presence of intraocular lens: Secondary | ICD-10-CM | POA: Diagnosis present

## 2023-01-30 DIAGNOSIS — I739 Peripheral vascular disease, unspecified: Secondary | ICD-10-CM | POA: Diagnosis present

## 2023-01-30 DIAGNOSIS — F17210 Nicotine dependence, cigarettes, uncomplicated: Secondary | ICD-10-CM | POA: Diagnosis present

## 2023-01-30 DIAGNOSIS — I2510 Atherosclerotic heart disease of native coronary artery without angina pectoris: Secondary | ICD-10-CM | POA: Diagnosis present

## 2023-01-30 DIAGNOSIS — J811 Chronic pulmonary edema: Secondary | ICD-10-CM | POA: Diagnosis present

## 2023-01-30 DIAGNOSIS — Z5320 Procedure and treatment not carried out because of patient's decision for unspecified reasons: Secondary | ICD-10-CM | POA: Diagnosis present

## 2023-01-30 DIAGNOSIS — J9621 Acute and chronic respiratory failure with hypoxia: Secondary | ICD-10-CM | POA: Diagnosis present

## 2023-01-30 DIAGNOSIS — E43 Unspecified severe protein-calorie malnutrition: Secondary | ICD-10-CM

## 2023-01-30 DIAGNOSIS — I10 Essential (primary) hypertension: Secondary | ICD-10-CM | POA: Diagnosis present

## 2023-01-30 DIAGNOSIS — M4854XA Collapsed vertebra, not elsewhere classified, thoracic region, initial encounter for fracture: Secondary | ICD-10-CM | POA: Diagnosis present

## 2023-01-30 DIAGNOSIS — E039 Hypothyroidism, unspecified: Secondary | ICD-10-CM | POA: Diagnosis not present

## 2023-01-30 DIAGNOSIS — Z9049 Acquired absence of other specified parts of digestive tract: Secondary | ICD-10-CM

## 2023-01-30 DIAGNOSIS — F419 Anxiety disorder, unspecified: Secondary | ICD-10-CM | POA: Diagnosis present

## 2023-01-30 DIAGNOSIS — Z79899 Other long term (current) drug therapy: Secondary | ICD-10-CM

## 2023-01-30 DIAGNOSIS — D649 Anemia, unspecified: Secondary | ICD-10-CM | POA: Diagnosis present

## 2023-01-30 DIAGNOSIS — Z681 Body mass index (BMI) 19 or less, adult: Secondary | ICD-10-CM | POA: Diagnosis not present

## 2023-01-30 DIAGNOSIS — S22000A Wedge compression fracture of unspecified thoracic vertebra, initial encounter for closed fracture: Secondary | ICD-10-CM

## 2023-01-30 DIAGNOSIS — Z9981 Dependence on supplemental oxygen: Secondary | ICD-10-CM

## 2023-01-30 DIAGNOSIS — J441 Chronic obstructive pulmonary disease with (acute) exacerbation: Secondary | ICD-10-CM | POA: Diagnosis present

## 2023-01-30 DIAGNOSIS — G894 Chronic pain syndrome: Secondary | ICD-10-CM | POA: Diagnosis present

## 2023-01-30 DIAGNOSIS — Z9842 Cataract extraction status, left eye: Secondary | ICD-10-CM

## 2023-01-30 DIAGNOSIS — Z89421 Acquired absence of other right toe(s): Secondary | ICD-10-CM

## 2023-01-30 DIAGNOSIS — Z8711 Personal history of peptic ulcer disease: Secondary | ICD-10-CM

## 2023-01-30 DIAGNOSIS — Z91148 Patient's other noncompliance with medication regimen for other reason: Secondary | ICD-10-CM

## 2023-01-30 DIAGNOSIS — Z7982 Long term (current) use of aspirin: Secondary | ICD-10-CM

## 2023-01-30 LAB — CBC WITH DIFFERENTIAL/PLATELET
Abs Immature Granulocytes: 0.02 10*3/uL (ref 0.00–0.07)
Basophils Absolute: 0 10*3/uL (ref 0.0–0.1)
Basophils Relative: 1 %
Eosinophils Absolute: 0.2 10*3/uL (ref 0.0–0.5)
Eosinophils Relative: 3 %
HCT: 37.1 % (ref 36.0–46.0)
Hemoglobin: 10.7 g/dL — ABNORMAL LOW (ref 12.0–15.0)
Immature Granulocytes: 0 %
Lymphocytes Relative: 9 %
Lymphs Abs: 0.7 10*3/uL (ref 0.7–4.0)
MCH: 27.7 pg (ref 26.0–34.0)
MCHC: 28.8 g/dL — ABNORMAL LOW (ref 30.0–36.0)
MCV: 96.1 fL (ref 80.0–100.0)
Monocytes Absolute: 0.2 10*3/uL (ref 0.1–1.0)
Monocytes Relative: 3 %
Neutro Abs: 6.6 10*3/uL (ref 1.7–7.7)
Neutrophils Relative %: 84 %
Platelets: 266 10*3/uL (ref 150–400)
RBC: 3.86 MIL/uL — ABNORMAL LOW (ref 3.87–5.11)
RDW: 16.4 % — ABNORMAL HIGH (ref 11.5–15.5)
WBC: 7.7 10*3/uL (ref 4.0–10.5)
nRBC: 0 % (ref 0.0–0.2)

## 2023-01-30 LAB — COMPREHENSIVE METABOLIC PANEL
ALT: 11 U/L (ref 0–44)
AST: 13 U/L — ABNORMAL LOW (ref 15–41)
Albumin: 3.2 g/dL — ABNORMAL LOW (ref 3.5–5.0)
Alkaline Phosphatase: 79 U/L (ref 38–126)
Anion gap: 3 — ABNORMAL LOW (ref 5–15)
BUN: 11 mg/dL (ref 8–23)
CO2: 30 mmol/L (ref 22–32)
Calcium: 8.4 mg/dL — ABNORMAL LOW (ref 8.9–10.3)
Chloride: 107 mmol/L (ref 98–111)
Creatinine, Ser: 0.59 mg/dL (ref 0.44–1.00)
GFR, Estimated: >60 mL/min (ref >60)
Glucose, Bld: 99 mg/dL (ref 70–99)
Potassium: 4.1 mmol/L (ref 3.5–5.1)
Sodium: 140 mmol/L (ref 135–145)
Total Bilirubin: 0.5 mg/dL (ref 0.3–1.2)
Total Protein: 6.8 g/dL (ref 6.5–8.1)

## 2023-01-30 LAB — RESP PANEL BY RT-PCR (RSV, FLU A&B, COVID)  RVPGX2
Influenza A by PCR: NEGATIVE
Influenza B by PCR: NEGATIVE
Resp Syncytial Virus by PCR: NEGATIVE
SARS Coronavirus 2 by RT PCR: NEGATIVE

## 2023-01-30 LAB — BLOOD GAS, ARTERIAL
Acid-Base Excess: 5.1 mmol/L — ABNORMAL HIGH (ref 0.0–2.0)
Bicarbonate: 32.7 mmol/L — ABNORMAL HIGH (ref 20.0–28.0)
O2 Content: 4 L/min
O2 Saturation: 98.4 %
Patient temperature: 37
pCO2 arterial: 65 mmHg — ABNORMAL HIGH (ref 32–48)
pH, Arterial: 7.31 — ABNORMAL LOW (ref 7.35–7.45)
pO2, Arterial: 100 mmHg (ref 83–108)

## 2023-01-30 LAB — BRAIN NATRIURETIC PEPTIDE: B Natriuretic Peptide: 415.9 pg/mL — ABNORMAL HIGH (ref 0.0–100.0)

## 2023-01-30 LAB — STREP PNEUMONIAE URINARY ANTIGEN: Strep Pneumo Urinary Antigen: NEGATIVE

## 2023-01-30 LAB — TROPONIN I (HIGH SENSITIVITY)
Troponin I (High Sensitivity): 13 ng/L (ref <18)
Troponin I (High Sensitivity): 12 ng/L (ref <18)

## 2023-01-30 MED ORDER — ENSURE ENLIVE PO LIQD
237.0000 mL | Freq: Two times a day (BID) | ORAL | Status: DC
  Administered 2023-01-30 – 2023-01-31 (×3): 237 mL via ORAL

## 2023-01-30 MED ORDER — LEVOTHYROXINE SODIUM 50 MCG PO TABS
50.0000 ug | ORAL_TABLET | Freq: Every morning | ORAL | Status: DC
  Administered 2023-01-31 – 2023-02-04 (×5): 50 ug via ORAL
  Filled 2023-01-30 (×5): qty 1

## 2023-01-30 MED ORDER — ONDANSETRON HCL 4 MG/2ML IJ SOLN: 4.0000 mg | Freq: Three times a day (TID) | INTRAMUSCULAR | Status: DC | PRN

## 2023-01-30 MED ORDER — PANTOPRAZOLE SODIUM 40 MG PO TBEC
40.0000 mg | DELAYED_RELEASE_TABLET | Freq: Every day | ORAL | Status: DC
  Administered 2023-01-30 – 2023-01-31 (×2): 40 mg via ORAL
  Filled 2023-01-30 (×2): qty 1

## 2023-01-30 MED ORDER — ALBUTEROL SULFATE (2.5 MG/3ML) 0.083% IN NEBU
5.0000 mg | INHALATION_SOLUTION | Freq: Once | RESPIRATORY_TRACT | Status: AC
  Administered 2023-01-30: 5 mg via RESPIRATORY_TRACT
  Filled 2023-01-30: qty 6

## 2023-01-30 MED ORDER — MOMETASONE FURO-FORMOTEROL FUM 200-5 MCG/ACT IN AERO
2.0000 | INHALATION_SPRAY | Freq: Two times a day (BID) | RESPIRATORY_TRACT | Status: DC
  Administered 2023-01-31 – 2023-02-04 (×9): 2 via RESPIRATORY_TRACT
  Filled 2023-01-30: qty 8.8

## 2023-01-30 MED ORDER — DM-GUAIFENESIN ER 30-600 MG PO TB12
1.0000 | ORAL_TABLET | Freq: Two times a day (BID) | ORAL | Status: DC | PRN
  Filled 2023-01-30: qty 1

## 2023-01-30 MED ORDER — ALPRAZOLAM 1 MG PO TABS
1.0000 mg | ORAL_TABLET | Freq: Three times a day (TID) | ORAL | Status: DC | PRN
  Administered 2023-01-30 – 2023-02-03 (×10): 1 mg via ORAL
  Filled 2023-01-30 (×2): qty 1
  Filled 2023-01-30: qty 2
  Filled 2023-01-30: qty 1
  Filled 2023-01-30 (×2): qty 2
  Filled 2023-01-30 (×2): qty 1
  Filled 2023-01-30 (×2): qty 2

## 2023-01-30 MED ORDER — SODIUM CHLORIDE 0.9 % IV SOLN
500.0000 mg | Freq: Once | INTRAVENOUS | Status: AC
  Administered 2023-01-30: 500 mg via INTRAVENOUS
  Filled 2023-01-30: qty 5

## 2023-01-30 MED ORDER — SODIUM CHLORIDE 0.9 % IV SOLN
2.0000 g | Freq: Once | INTRAVENOUS | Status: AC
  Administered 2023-01-30: 2 g via INTRAVENOUS
  Filled 2023-01-30: qty 20

## 2023-01-30 MED ORDER — AMLODIPINE BESYLATE 5 MG PO TABS
5.0000 mg | ORAL_TABLET | Freq: Every day | ORAL | Status: DC
  Administered 2023-01-30 – 2023-02-04 (×6): 5 mg via ORAL
  Filled 2023-01-30 (×6): qty 1

## 2023-01-30 MED ORDER — ADULT MULTIVITAMIN W/MINERALS CH
1.0000 | ORAL_TABLET | Freq: Every day | ORAL | Status: DC
  Administered 2023-01-31 – 2023-02-04 (×5): 1 via ORAL
  Filled 2023-01-30 (×5): qty 1

## 2023-01-30 MED ORDER — IPRATROPIUM-ALBUTEROL 0.5-2.5 (3) MG/3ML IN SOLN
3.0000 mL | RESPIRATORY_TRACT | Status: DC
  Administered 2023-01-30 – 2023-01-31 (×5): 3 mL via RESPIRATORY_TRACT
  Filled 2023-01-30 (×6): qty 3

## 2023-01-30 MED ORDER — SODIUM CHLORIDE 0.9 % IV SOLN
1.0000 g | INTRAVENOUS | Status: DC
  Administered 2023-01-31 – 2023-02-02 (×3): 1 g via INTRAVENOUS
  Filled 2023-01-30 (×2): qty 1
  Filled 2023-01-30 (×2): qty 10

## 2023-01-30 MED ORDER — METOPROLOL TARTRATE 50 MG PO TABS
50.0000 mg | ORAL_TABLET | Freq: Two times a day (BID) | ORAL | Status: DC
  Administered 2023-01-30 – 2023-02-04 (×10): 50 mg via ORAL
  Filled 2023-01-30 (×10): qty 1

## 2023-01-30 MED ORDER — ASPIRIN 81 MG PO TBEC
81.0000 mg | DELAYED_RELEASE_TABLET | Freq: Every day | ORAL | Status: DC
  Administered 2023-01-30 – 2023-02-04 (×6): 81 mg via ORAL
  Filled 2023-01-30 (×6): qty 1

## 2023-01-30 MED ORDER — OXYCODONE-ACETAMINOPHEN 5-325 MG PO TABS
1.0000 | ORAL_TABLET | ORAL | Status: DC | PRN
  Administered 2023-01-30 – 2023-01-31 (×4): 1 via ORAL
  Filled 2023-01-30 (×4): qty 1

## 2023-01-30 MED ORDER — ENOXAPARIN SODIUM 40 MG/0.4ML IJ SOSY
40.0000 mg | PREFILLED_SYRINGE | INTRAMUSCULAR | Status: DC
  Administered 2023-01-30 – 2023-02-03 (×5): 40 mg via SUBCUTANEOUS
  Filled 2023-01-30 (×5): qty 0.4

## 2023-01-30 MED ORDER — MAGNESIUM SULFATE 2 GM/50ML IV SOLN
2.0000 g | INTRAVENOUS | Status: AC
  Administered 2023-01-30: 2 g via INTRAVENOUS
  Filled 2023-01-30: qty 50

## 2023-01-30 MED ORDER — DOXYCYCLINE HYCLATE 100 MG PO TABS
100.0000 mg | ORAL_TABLET | Freq: Two times a day (BID) | ORAL | Status: DC
  Administered 2023-01-30 – 2023-02-02 (×8): 100 mg via ORAL
  Filled 2023-01-30 (×8): qty 1

## 2023-01-30 MED ORDER — TRAZODONE HCL 100 MG PO TABS
300.0000 mg | ORAL_TABLET | Freq: Every day | ORAL | Status: DC
  Administered 2023-01-30 – 2023-02-03 (×5): 300 mg via ORAL
  Filled 2023-01-30 (×5): qty 3

## 2023-01-30 MED ORDER — METHOCARBAMOL 500 MG PO TABS
500.0000 mg | ORAL_TABLET | Freq: Three times a day (TID) | ORAL | Status: DC | PRN
  Administered 2023-01-30 – 2023-02-02 (×2): 500 mg via ORAL
  Filled 2023-01-30 (×2): qty 1

## 2023-01-30 MED ORDER — ACETAMINOPHEN 325 MG PO TABS
650.0000 mg | ORAL_TABLET | Freq: Four times a day (QID) | ORAL | Status: DC | PRN
  Administered 2023-01-30 – 2023-02-02 (×2): 650 mg via ORAL
  Filled 2023-01-30 (×2): qty 2

## 2023-01-30 MED ORDER — HYDRALAZINE HCL 20 MG/ML IJ SOLN: 5.0000 mg | INTRAMUSCULAR | Status: DC | PRN

## 2023-01-30 MED ORDER — MONTELUKAST SODIUM 10 MG PO TABS
10.0000 mg | ORAL_TABLET | Freq: Every day | ORAL | Status: DC
  Administered 2023-01-30 – 2023-02-03 (×5): 10 mg via ORAL
  Filled 2023-01-30 (×5): qty 1

## 2023-01-30 MED ORDER — SODIUM CHLORIDE 0.9 % IV BOLUS
1000.0000 mL | Freq: Once | INTRAVENOUS | Status: AC
  Administered 2023-01-30: 1000 mL via INTRAVENOUS

## 2023-01-30 MED ORDER — METHYLPREDNISOLONE SODIUM SUCC 125 MG IJ SOLR
125.0000 mg | INTRAMUSCULAR | Status: AC
  Administered 2023-01-30: 125 mg via INTRAVENOUS
  Filled 2023-01-30: qty 2

## 2023-01-30 MED ORDER — IOHEXOL 350 MG/ML SOLN
75.0000 mL | Freq: Once | INTRAVENOUS | Status: AC | PRN
  Administered 2023-01-30: 75 mL via INTRAVENOUS

## 2023-01-30 MED ORDER — IPRATROPIUM-ALBUTEROL 0.5-2.5 (3) MG/3ML IN SOLN
3.0000 mL | Freq: Once | RESPIRATORY_TRACT | Status: AC
  Administered 2023-01-30: 3 mL via RESPIRATORY_TRACT
  Filled 2023-01-30: qty 3

## 2023-01-30 MED ORDER — THIAMINE MONONITRATE 100 MG PO TABS
100.0000 mg | ORAL_TABLET | Freq: Every day | ORAL | Status: DC
  Administered 2023-01-31 – 2023-02-04 (×5): 100 mg via ORAL
  Filled 2023-01-30 (×5): qty 1

## 2023-01-30 MED ORDER — ALBUTEROL SULFATE (2.5 MG/3ML) 0.083% IN NEBU: 2.5000 mg | INHALATION_SOLUTION | RESPIRATORY_TRACT | Status: DC | PRN

## 2023-01-30 MED ORDER — ATORVASTATIN CALCIUM 10 MG PO TABS
10.0000 mg | ORAL_TABLET | Freq: Every day | ORAL | Status: DC
  Administered 2023-01-30 – 2023-02-04 (×6): 10 mg via ORAL
  Filled 2023-01-30 (×6): qty 1
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
## 2023-01-30 NOTE — ED Triage Notes (Signed)
Pt arrives via EMS from home for SOB. Pt has hx of  COPD and afib. Pt given duoneb en route and refused aspirin from EMS. Pt is on home oxygen at 4lpm. Pt arrives alert with congested sounding cough and audible wheezing. Pt is a smoker.

## 2023-01-30 NOTE — H&P (Signed)
History and Physical    [name] [id number] DOB: 1946/05/31 DOA: 01/30/2023  Referring MD/NP/PA:   PCP: Albina Billet, MD   Patient coming from:  The patient is coming from home.    Chief Complaint: SOB  HPI: [name] is a 77 y.o. female with medical history significant of COPD (on 2-3L oxygen), hypertension, hyperlipidemia, PVD, CAD, hypothyroidism, GERD, depression with anxiety, anemia, former smoker, medication noncompliance, who presents with shortness of breath.  Patient states that she has short breath, cough and wheezing in the past several days.  Her shortness of breath has been progressively worsening.  She coughs up greenish sputum.  No fever or chills.  She also reports frontal chest pain, mild to moderate, radiating to back, aching, intermittent, seem to be pleuritic, aggravated by deep breath.  Patient has nausea, no vomiting, diarrhea or abdominal pain.  No symptoms of UTI.  Patient is normally using 2-3 L oxygen, was found to have severe respiratory distress, cannot speak in full sentence, using accessory muscle for breathing in ED.  Oxygen desaturation to 70-80s on home level of 2 L oxygen. Initially started on 8 L oxygen, then changed to to 4 L oxygen with 99% saturation.  Patient refused BiPAP per ED physician.  Data reviewed independently and ED Course: pt was found to have WBC 7.7, negative PCR for COVID, flu and RSV, GFR> 60, temperature normal, blood pressure 148/75, heart rate 133, 123, RR 38 -->23.  ABG with pH 7.31, CO2 65, O2 100.  Chest x-ray showed interstitial edema, patchy bilateral basilar infiltration.  Patient is admitted to PCU as inpatient.  CTA: 1. No pulmonary emboli. 2. Background pattern of widespread emphysema. 3. Bronchial thickening in the lower lobes. Patchy infiltrates/atelectasis at the right lung base, similar to the study of February 2022. This could be clinically relevant basilar bronchopneumonia. Mild patchy density at the  left lung base appears similar as well. 4. Redemonstration of mild mediastinal and right hilar lymphadenopathy, similar to the study of February 2022. This is presumed reactive to the right lower lobe inflammatory disease. 5. Coronary artery calcification and aortic atherosclerotic calcification. 60. Multiple old minor compression fractures in the thoracic spine. Since the study of 2022, there is a newly seen fracture at T7 which could be acute/subacute. Minor superior endplate fracture at 624THL, though newly seen since February of 2022, looks old and healed. 7. Aortic atherosclerosis.   Aortic Atherosclerosis (ICD10-I70.0) and Emphysema (ICD10-J43.9).   EKG: Not done in ED, will get one.      Review of Systems:   General: no fevers, chills, no body weight gain, has fatigue HEENT: no blurry vision, hearing changes or sore throat Respiratory: has dyspnea, coughing, wheezing CV: has chest pain, no palpitations GI: has nausea, no vomiting, abdominal pain, diarrhea, constipation GU: no dysuria, burning on urination, increased urinary frequency, hematuria  Ext: no leg edema Neuro: no unilateral weakness, numbness, or tingling, no vision change or hearing loss Skin: no rash, no skin tear. MSK: No muscle spasm, no deformity, no limitation of range of movement in spin Heme: No easy bruising.  Travel history: No recent long distant travel.   Allergy: No Known Allergies  Past Medical History:  Diagnosis Date   Anxiety    CAD (coronary artery disease)    Carpal tunnel syndrome    Cataract    Complication of anesthesia    has woken up at times   COPD (chronic obstructive pulmonary disease) (HCC)    CRP  elevated 09/14/2015   Depression    Difficult intubation    Dyslipidemia    Dyspnea    DOE   Dysrhythmia    hx palpatations   Elevated sedimentation rate 09/14/2015   Esophageal spasm    Gastrointestinal parasites    GERD (gastroesophageal reflux disease)    HCAP  (healthcare-associated pneumonia) 10/22/2018   Hiatal hernia    History of peptic ulcer disease    Hypertension    Hyperthyroidism    Hypothyroidism    Low magnesium levels 09/14/2015   Pelvic fracture ([place]) 2008   fall from riding a horse   Peripheral vascular disease ([place])    Reflux    Rotator cuff injury    Sepsis ([place]) 10/22/2018   Stenosis, spinal, lumbar     Past Surgical History:  Procedure Laterality Date   AMPUTATION TOE Right 12/26/2020   Procedure: AMPUTATION TOE-3rd Toes;  Surgeon: Samara Deist, DPM;  Location: [facility];  Service: Podiatry;  Laterality: Right;   AMPUTATION TOE Right 04/22/2021   Procedure: AMPUTATION TOE- RIGHT 2ND;  Surgeon: Samara Deist, DPM;  Location: [facility];  Service: Podiatry;  Laterality: Right;   AMPUTATION TOE Right 10/21/2021   Procedure: AMPUTATION TOE;  Surgeon: Caroline More, DPM;  Location: [facility];  Service: Podiatry;  Laterality: Right;   AMPUTATION TOE Right 11/23/2021   Procedure: AMPUTATION TOE;  Surgeon: Sharlotte Alamo, DPM;  Location: [facility];  Service: Podiatry;  Laterality: Right;   AMPUTATION TOE Right 07/05/2022   Procedure: AMPUTATION RIGHT 5TH TOE;  Surgeon: [name], DPM;  Location: [facility];  Service: Podiatry;  Laterality: Right;   APPENDECTOMY     BACK SURGERY     CERVICAL FUSION   CARPAL TUNNEL RELEASE     CATARACT EXTRACTION W/PHACO Right 08/07/2017   Procedure: CATARACT EXTRACTION PHACO AND INTRAOCULAR LENS PLACEMENT ([place]);  Surgeon: Birder Robson, MD;  Location: [facility];  Service: Ophthalmology;  Laterality: Right;  [place] 00:52.0 AP% 16.8 CDE 8.74 Fluid Pack Lot # [id number]   CATARACT EXTRACTION W/PHACO Left 09/04/2017   Procedure: CATARACT EXTRACTION PHACO AND INTRAOCULAR LENS PLACEMENT ([place]);  Surgeon: Birder Robson, MD;  Location: [facility];  Service: Ophthalmology;  Laterality: Left;  [place] 00:34 AP% 17.0 CDE 5.80 Fluid pack lot # [id number]   CESAREAN SECTION     CHOLECYSTECTOMY     FRACTURE SURGERY      HIP SURGERY     INTRAMEDULLARY (IM) NAIL INTERTROCHANTERIC Right 10/01/2016   Procedure: INTRAMEDULLARY (IM) NAIL INTERTROCHANTRIC;  Surgeon: Corky Mull, MD;  Location: [facility];  Service: Orthopedics;  Laterality: Right;   INTRAMEDULLARY (IM) NAIL INTERTROCHANTERIC Left 01/14/2021   Procedure: INTRAMEDULLARY (IM) NAIL INTERTROCHANTRIC;  Surgeon: Lovell Sheehan, MD;  Location: [facility];  Service: Orthopedics;  Laterality: Left;   KYPHOPLASTY N/A 08/20/2018   Procedure: [id number];  Surgeon: Hessie Knows, MD;  Location: [facility];  Service: Orthopedics;  Laterality: N/A;   LOWER EXTREMITY ANGIOGRAPHY Right 12/18/2019   Procedure: LOWER EXTREMITY ANGIOGRAPHY;  Surgeon: Algernon Huxley, MD;  Location: [place] [facility];  Service: Cardiovascular;  Laterality: Right;   LOWER EXTREMITY ANGIOGRAPHY Right 12/02/2020   Procedure: LOWER EXTREMITY ANGIOGRAPHY;  Surgeon: Algernon Huxley, MD;  Location: [place] [facility];  Service: Cardiovascular;  Laterality: Right;   LOWER EXTREMITY ANGIOGRAPHY Right 12/23/2020   Procedure: Lower Extremity Angiography;  Surgeon: Algernon Huxley, MD;  Location: [place] [facility];  Service: Cardiovascular;  Laterality: Right;   LOWER EXTREMITY ANGIOGRAPHY Right 12/24/2020  Procedure: Lower Extremity Angiography;  Surgeon: Algernon Huxley, MD;  Location: [place] [facility];  Service: Cardiovascular;  Laterality: Right;   LOWER EXTREMITY ANGIOGRAPHY Right 10/20/2021   Procedure: Lower Extremity Angiography;  Surgeon: Algernon Huxley, MD;  Location: [place] [facility];  Service: Cardiovascular;  Laterality: Right;   LOWER EXTREMITY ANGIOGRAPHY Right 07/03/2022   Procedure: Lower Extremity Angiography;  Surgeon: Algernon Huxley, MD;  Location: [place] [facility];  Service: Cardiovascular;  Laterality: Right;   NECK SURGERY     NOSE SURGERY     ROTATOR CUFF REPAIR     x2   SHOULDER ARTHROSCOPY  12/07/2011   Procedure: ARTHROSCOPY SHOULDER;  Surgeon: Ninetta Lights, MD;  Location:  [place];  Service: Orthopedics;  Laterality: Right;  Debridement Partial Cuff Tear, Release Coracoacromial Ligament   SHOULDER SURGERY  12/07/2011   right    Social History:  reports that she has been smoking cigarettes. She has never used smokeless tobacco. She reports that she does not drink alcohol and does not use drugs.  Family History:  Family History  Problem Relation Age of Onset   Alcoholism Brother    Aneurysm Other      Prior to Admission medications   Medication Sig Start Date End Date Taking? Authorizing Provider  albuterol (VENTOLIN HFA) 108 (90 Base) MCG/ACT inhaler Inhale 1-2 puffs into the lungs every 4 (four) hours as needed for shortness of breath or wheezing. 06/28/22   [provider]  ALPRAZolam Duanne Moron) 1 MG tablet Take 1 mg by mouth 3 (three) times daily as needed. 10/06/21   [provider]  amLODipine (NORVASC) 5 MG tablet Take 1 tablet (5 mg total) by mouth daily. Patient not taking: Reported on 01/11/2023 11/10/22   Lorella Nimrod, MD  ammonium lactate (LAC-HYDRIN) 12 % lotion APPLY TO AFFECTED AREA AS NEEDED FOR DRY SKIN 01/02/23   Felipa Furnace, DPM  ASPIRIN LOW DOSE 81 MG tablet Take 81 mg by mouth daily. 12/29/22   [provider]  atorvastatin (LIPITOR) 10 MG tablet Take 1 tablet (10 mg total) by mouth daily. Patient not taking: Reported on 01/11/2023 11/10/22 11/10/23  Lorella Nimrod, MD  azithromycin (ZITHROMAX) 250 MG tablet Take 250 mg by mouth as directed. Take 1 tablet (250 mg total) by mouth every Monday, Wednesday, and Friday for 91 days 12/27/22 03/28/23  [provider]  COMBIVENT RESPIMAT 20-100 MCG/ACT AERS respimat Inhale 1-2 puffs into the lungs 4 (four) times daily. 11/25/21   Loletha Grayer, MD  levothyroxine (SYNTHROID) 50 MCG tablet Take 50 mcg by mouth every morning. 08/09/22   [provider]  metoprolol tartrate (LOPRESSOR) 50 MG tablet Take 1 tablet (50 mg total) by mouth 2 (two) times  daily. Patient not taking: Reported on 01/11/2023 11/10/22 02/08/23  Lorella Nimrod, MD  Multiple Vitamin (MULTIVITAMIN WITH MINERALS) TABS tablet Take 1 tablet by mouth daily. 11/10/22   Lorella Nimrod, MD  nutrition supplement, JUVEN, (JUVEN) PACK Take 1 packet by mouth 2 (two) times daily between meals. Patient not taking: Reported on 06/29/2022 11/25/21   Loletha Grayer, MD  omeprazole (PRILOSEC) 40 MG capsule Take 40 mg by mouth daily. 12/06/22   [provider]  polyethylene glycol (MIRALAX / GLYCOLAX) 17 g packet Take 17 g by mouth daily. Patient not taking: Reported on 06/29/2022 11/25/21   Loletha Grayer, MD  senna-docusate (SENOKOT-S) 8.6-50 MG tablet Take 2 tablets by mouth at bedtime. Patient not taking: Reported on 06/29/2022  11/25/21   Loletha Grayer, MD  thiamine (VITAMIN B-1) 100 MG tablet Take 1 tablet (100 mg total) by mouth daily. 11/10/22   Lorella Nimrod, MD  traZODone (DESYREL) 150 MG tablet Take 300 mg by mouth at bedtime. 09/22/21   [provider]    Physical Exam: Vitals:   01/30/23 0900 01/30/23 1000 01/30/23 1015 01/30/23 1030  BP: (!) 148/83 (!) 148/75  (!) 150/87  Pulse:    (!) 126  Resp: (!) 35 (!) 37  (!) 23  Temp:      TempSrc:      SpO2:   99% 99%   General: Has acute respiratory distress.  Thin body habitus HEENT:       Eyes: PERRL, EOMI, no scleral icterus.       ENT: No discharge from the ears and nose, no pharynx injection, no tonsillar enlargement.        Neck: No JVD, no bruit, no mass felt. Heme: No neck lymph node enlargement. Cardiac: S1/S2, RRR, No murmurs, No gallops or rubs. Respiratory: Has wheezing bilaterally GI: Soft, nondistended, nontender, no rebound pain, no organomegaly, BS present. GU: No hematuria Ext: No pitting leg edema bilaterally. 1+DP/PT pulse bilaterally. Musculoskeletal: No joint deformities, No joint redness or warmth, no limitation of ROM in spin. Skin: No rashes.  Neuro: Alert, oriented X3, cranial nerves  II-XII grossly intact, moves all extremities normally.  Psych: Patient is not psychotic, no suicidal or hemocidal ideation.  Labs on Admission: I have personally reviewed following labs and imaging studies  CBC: Recent Labs  Lab 01/30/23 0740  WBC 7.7  NEUTROABS 6.6  HGB 10.7*  HCT 37.1  MCV 96.1  PLT 123456   Basic Metabolic Panel: Recent Labs  Lab 01/30/23 0740  NA 140  K 4.1  CL 107  CO2 30  GLUCOSE 99  BUN 11  CREATININE 0.59  CALCIUM 8.4*   GFR: CrCl cannot be calculated (Unknown ideal weight.). Liver Function Tests: Recent Labs  Lab 01/30/23 0740  AST 13*  ALT 11  ALKPHOS 79  BILITOT 0.5  PROT 6.8  ALBUMIN 3.2*   No results for input(s): "LIPASE", "AMYLASE" in the last 168 hours. No results for input(s): "AMMONIA" in the last 168 hours. Coagulation Profile: No results for input(s): "INR", "PROTIME" in the last 168 hours. Cardiac Enzymes: No results for input(s): "CKTOTAL", "CKMB", "CKMBINDEX", "TROPONINI" in the last 168 hours. BNP (last 3 results) No results for input(s): "PROBNP" in the last 8760 hours. HbA1C: No results for input(s): "HGBA1C" in the last 72 hours. CBG: No results for input(s): "GLUCAP" in the last 168 hours. Lipid Profile: No results for input(s): "CHOL", "HDL", "LDLCALC", "TRIG", "CHOLHDL", "LDLDIRECT" in the last 72 hours. Thyroid Function Tests: No results for input(s): "TSH", "T4TOTAL", "FREET4", "T3FREE", "THYROIDAB" in the last 72 hours. Anemia Panel: No results for input(s): "VITAMINB12", "FOLATE", "FERRITIN", "TIBC", "IRON", "RETICCTPCT" in the last 72 hours. Urine analysis:    Component Value Date/Time   COLORURINE YELLOW (A) 11/08/2022 1252   APPEARANCEUR HAZY (A) 11/08/2022 1252   LABSPEC 1.021 11/08/2022 1252   PHURINE 6.0 11/08/2022 1252   GLUCOSEU NEGATIVE 11/08/2022 1252   HGBUR SMALL (A) 11/08/2022 1252   BILIRUBINUR NEGATIVE 11/08/2022 1252   KETONESUR 5 (A) 11/08/2022 1252   PROTEINUR 30 (A) 11/08/2022  1252   NITRITE NEGATIVE 11/08/2022 1252   LEUKOCYTESUR SMALL (A) 11/08/2022 1252   Sepsis Labs: '@LABRCNTIP'$ (procalcitonin:4,lacticidven:4) ) Recent Results (from the past 240 hour(s))  Resp panel by RT-PCR (RSV,  Flu A&B, Covid) Anterior Nasal Swab     Status: None   Collection Time: 01/30/23  7:40 AM   Specimen: Anterior Nasal Swab  Result Value Ref Range Status   SARS Coronavirus 2 by RT PCR NEGATIVE NEGATIVE Final    Comment: (NOTE) SARS-CoV-2 target nucleic acids are NOT DETECTED.  The SARS-CoV-2 RNA is generally detectable in upper respiratory specimens during the acute phase of infection. The lowest concentration of SARS-CoV-2 viral copies this assay can detect is 138 copies/mL. A negative result does not preclude SARS-Cov-2 infection and should not be used as the sole basis for treatment or other patient management decisions. A negative result may occur with  improper specimen collection/handling, submission of specimen other than nasopharyngeal swab, presence of viral mutation(s) within the areas targeted by this assay, and inadequate number of viral copies(<138 copies/mL). A negative result must be combined with clinical observations, patient history, and epidemiological information. The expected result is Negative.  Fact Sheet for Patients:  EntrepreneurPulse.com.au  Fact Sheet for Healthcare Providers:  IncredibleEmployment.be  This test is no t yet approved or cleared by the [place] FDA and  has been authorized for detection and/or diagnosis of SARS-CoV-2 by FDA under an Emergency Use Authorization (EUA). This EUA will remain  in effect (meaning this test can be used) for the duration of the COVID-19 declaration under Section 564(b)(1) of the Act, 21 U.S.C.section 360bbb-3(b)(1), unless the authorization is terminated  or revoked sooner.       Influenza A by PCR NEGATIVE NEGATIVE Final   Influenza B by PCR NEGATIVE  NEGATIVE Final    Comment: (NOTE) The Xpert Xpress SARS-CoV-2/FLU/RSV plus assay is intended as an aid in the diagnosis of influenza from Nasopharyngeal swab specimens and should not be used as a sole basis for treatment. Nasal washings and aspirates are unacceptable for Xpert Xpress SARS-CoV-2/FLU/RSV testing.  Fact Sheet for Patients: EntrepreneurPulse.com.au  Fact Sheet for Healthcare Providers: IncredibleEmployment.be  This test is not yet approved or cleared by the [place] FDA and has been authorized for detection and/or diagnosis of SARS-CoV-2 by FDA under an Emergency Use Authorization (EUA). This EUA will remain in effect (meaning this test can be used) for the duration of the COVID-19 declaration under Section 564(b)(1) of the Act, 21 U.S.C. section 360bbb-3(b)(1), unless the authorization is terminated or revoked.     Resp Syncytial Virus by PCR NEGATIVE NEGATIVE Final    Comment: (NOTE) Fact Sheet for Patients: EntrepreneurPulse.com.au  Fact Sheet for Healthcare Providers: IncredibleEmployment.be  This test is not yet approved or cleared by the [place] FDA and has been authorized for detection and/or diagnosis of SARS-CoV-2 by FDA under an Emergency Use Authorization (EUA). This EUA will remain in effect (meaning this test can be used) for the duration of the COVID-19 declaration under Section 564(b)(1) of the Act, 21 U.S.C. section 360bbb-3(b)(1), unless the authorization is terminated or revoked.  Performed at [facility], [address] [place], [place] [address]      Radiological Exams on Admission: CT Angio Chest PE W and/or Wo Contrast  Result Date: 01/30/2023 CLINICAL DATA:  Pulmonary embolism suspected, high probability. Shortness of breath. COPD and atrial fibrillation. EXAM: CT ANGIOGRAPHY CHEST WITH CONTRAST TECHNIQUE: Multidetector CT imaging of the chest  was performed using the standard protocol during bolus administration of intravenous contrast. Multiplanar CT image reconstructions and MIPs were obtained to evaluate the vascular anatomy. RADIATION DOSE REDUCTION: This exam was performed according to the departmental dose-optimization program  which includes automated exposure control, adjustment of the mA and/or kV according to patient size and/or use of iterative reconstruction technique. CONTRAST:  46m OMNIPAQUE IOHEXOL 350 MG/ML SOLN COMPARISON:  Chest radiography same day.  CT 01/13/2021. FINDINGS: Cardiovascular: Heart size is normal. Coronary artery calcification and aortic atherosclerotic calcification are present. Pulmonary arterial opacification is good. There are no pulmonary emboli. Mediastinum/Nodes: Redemonstration of mild mediastinal and right hilar lymphadenopathy, similar to the study of February 2022. Right hilar lymph node measures up to 2.2 cm. Infrahilar lymph node on the right measures up to 17 mm. No new or progressive finding. Lungs/Pleura: Background pattern of widespread emphysema. Bronchial thickening in the lower lobes. Patchy infiltrates/atelectasis at the right lung base, similar to the study of 2022, consistent with bronchopneumonia. Mild chronic patchy density at the left lung base appears similar as well. Upper Abdomen: No acute upper abdominal finding. Previous cholecystectomy. Musculoskeletal: Multiple old minor compression fractures in the thoracic spine. Since the study of 2022, there is a newly seen fracture at T7 which could be acute/subacute. Minor superior endplate fracture at T624THL though newly seen since February of 2022, looks old and healed. Review of the MIP images confirms the above findings. IMPRESSION: 1. No pulmonary emboli. 2. Background pattern of widespread emphysema. 3. Bronchial thickening in the lower lobes. Patchy infiltrates/atelectasis at the right lung base, similar to the study of February 2022. This could  be clinically relevant basilar bronchopneumonia. Mild patchy density at the left lung base appears similar as well. 4. Redemonstration of mild mediastinal and right hilar lymphadenopathy, similar to the study of February 2022. This is presumed reactive to the right lower lobe inflammatory disease. 5. Coronary artery calcification and aortic atherosclerotic calcification. 640 Multiple old minor compression fractures in the thoracic spine. Since the study of 2022, there is a newly seen fracture at T7 which could be acute/subacute. Minor superior endplate fracture at T624THL though newly seen since February of 2022, looks old and healed. 7. Aortic atherosclerosis. Aortic Atherosclerosis (ICD10-I70.0) and Emphysema (ICD10-J43.9). Electronically Signed   By: MNelson ChimesM.D.   On: 01/30/2023 11:55   DG Chest Portable 1 View  Result Date: 01/30/2023 CLINICAL DATA:  Shortness of breath. History of COPD and atrial fibrillation. Cough and wheezing. EXAM: PORTABLE CHEST 1 VIEW COMPARISON:  01/11/23 FINDINGS: Heart size appears normal. Central airway thickening noted. Mild diffuse increase interstitial markings compatible with pulmonary edema. Patchy airspace opacities within both lung bases may represent areas of airspace disease or atelectasis. No acute osseous findings. IMPRESSION: 1. Mild pulmonary edema. 2. Patchy airspace opacities within both lung bases may represent areas of airspace disease or atelectasis. 3. Central airway thickening compatible with the history of COPD. Electronically Signed   By: TKerby MoorsM.D.   On: 01/30/2023 07:30      Assessment/Plan Principal Problem:   COPD exacerbation (HCC) Active Problems:   Acute on chronic respiratory failure with hypoxia (HCC)   CAD (coronary artery disease)   Essential hypertension   Normocytic anemia   Hypothyroidism   Compression fracture of body of thoracic vertebra (HCC)   Anxiety and depression   Protein-calorie malnutrition,  severe   Assessment and Plan:   Acute on chronic respiratory failure with hypoxia due to COPD exacerbation ([facility]: CTA negative for PE.  Patient has aroductive cough, wheezing, worsening shortness of breath, increased oxygen requirement, clinically consistent with COPD exacerbation.  Images showed patchy infiltration, cannot completely rule out the possibility of mild pneumonia.  - will admit to  PCU as inpatient -Bronchodilators -Patient received 2 g of magnesium sulfate in ED -Solu-Medrol 40 mg IV bid -IV rocephin and oral doxycycline (patient recently used azithromycin, will switch azithromycin to doxycycline.  Patient received 1 dose of azithromycin in ED.) -Mucinex for cough  -Incentive spirometry -sputum culture -Nasal cannula oxygen as needed to maintain O2 saturation 93% or greater -check RVP  CAD (coronary artery disease): -ASA, Lipitor,  Essential hypertension: -IV hydralazine as needed -Amlodipine,, metoprolol  Normocytic anemia: Hgb 10.7 (11.0 on  01/11/23) -f/u with CBC  Hypothyroidism: Continue Synthroid  Compression fracture of body of thoracic vertebra (HCC) -As needed Tylenol and Percocet -As needed Robaxin  Anxiety and depression: -Continue home medications  Protein-calorie malnutrition, severe: Body weight 45.4 kg, BMI 17.17 -Ensure -Nutrition consult        DVT ppx:  SQ Lovenox  Code Status: DNR per her power of attorney, [name]  Family Communication:   Yes, patient's power of attorney by phone  Disposition Plan:  Anticipate discharge back to previous environment  Consults called: none    Admission status and Level of care: Progressive:   as inpt       Dispo: The patient is from: Home              Anticipated d/c is to: Home              Anticipated d/c date is: 2 days              Patient currently is not medically stable to d/c.    Severity of Illness:  The appropriate patient status for this patient is INPATIENT.  Inpatient status is judged to be reasonable and necessary in order to provide the required intensity of service to ensure the patient's safety. The patient's presenting symptoms, physical exam findings, and initial radiographic and laboratory data in the context of their chronic comorbidities is felt to place them at high risk for further clinical deterioration. Furthermore, it is not anticipated that the patient will be medically stable for discharge from the hospital within 2 midnights of admission.   * I certify that at the point of admission it is my clinical judgment that the patient will require inpatient hospital care spanning beyond 2 midnights from the point of admission due to high intensity of service, high risk for further deterioration and high frequency of surveillance required.*       Date of Service 01/30/2023    Ivor Costa [facility]   If 7PM-7AM, please contact night-coverage [email or web address] 01/30/2023, 12:24 PM

## 2023-01-31 DIAGNOSIS — J441 Chronic obstructive pulmonary disease with (acute) exacerbation: Secondary | ICD-10-CM | POA: Diagnosis not present

## 2023-01-31 LAB — CBC
HCT: 31 % — ABNORMAL LOW (ref 36.0–46.0)
Hemoglobin: 9.4 g/dL — ABNORMAL LOW (ref 12.0–15.0)
MCH: 27.9 pg (ref 26.0–34.0)
MCHC: 30.3 g/dL (ref 30.0–36.0)
MCV: 92 fL (ref 80.0–100.0)
Platelets: 218 10*3/uL (ref 150–400)
RBC: 3.37 MIL/uL — ABNORMAL LOW (ref 3.87–5.11)
RDW: 15.9 % — ABNORMAL HIGH (ref 11.5–15.5)
WBC: 6.2 10*3/uL (ref 4.0–10.5)
nRBC: 0 % (ref 0.0–0.2)

## 2023-01-31 LAB — RESPIRATORY PANEL BY PCR

## 2023-01-31 LAB — BASIC METABOLIC PANEL
Anion gap: 6 (ref 5–15)
BUN: 12 mg/dL (ref 8–23)
CO2: 25 mmol/L (ref 22–32)
Calcium: 8.2 mg/dL — ABNORMAL LOW (ref 8.9–10.3)
Chloride: 108 mmol/L (ref 98–111)
Creatinine, Ser: 0.52 mg/dL (ref 0.44–1.00)
GFR, Estimated: >60 mL/min (ref >60)
Glucose, Bld: 90 mg/dL (ref 70–99)
Potassium: 3.6 mmol/L (ref 3.5–5.1)
Sodium: 139 mmol/L (ref 135–145)

## 2023-01-31 LAB — LEGIONELLA PNEUMOPHILA SEROGP 1 UR AG: L. pneumophila Serogp 1 Ur Ag: NEGATIVE

## 2023-01-31 MED ORDER — FUROSEMIDE 10 MG/ML IJ SOLN: 40.0000 mg | Freq: Once | INTRAMUSCULAR | Status: DC

## 2023-01-31 MED ORDER — CALCIUM CARBONATE ANTACID 500 MG PO CHEW
1.0000 | CHEWABLE_TABLET | Freq: Three times a day (TID) | ORAL | Status: DC
  Administered 2023-01-31 – 2023-02-04 (×10): 200 mg via ORAL
  Filled 2023-01-31 (×10): qty 1

## 2023-01-31 MED ORDER — OXYCODONE-ACETAMINOPHEN 5-325 MG PO TABS
1.0000 | ORAL_TABLET | Freq: Four times a day (QID) | ORAL | Status: DC | PRN
  Administered 2023-01-31 – 2023-02-04 (×10): 1 via ORAL
  Filled 2023-01-31 (×10): qty 1

## 2023-01-31 MED ORDER — IPRATROPIUM-ALBUTEROL 0.5-2.5 (3) MG/3ML IN SOLN
3.0000 mL | Freq: Four times a day (QID) | RESPIRATORY_TRACT | Status: DC
  Administered 2023-01-31 – 2023-02-01 (×2): 3 mL via RESPIRATORY_TRACT
  Filled 2023-01-31 (×2): qty 3

## 2023-01-31 MED ORDER — FUROSEMIDE 10 MG/ML IJ SOLN
40.0000 mg | Freq: Two times a day (BID) | INTRAMUSCULAR | Status: DC
  Administered 2023-01-31: 40 mg via INTRAVENOUS
  Filled 2023-01-31: qty 4

## 2023-01-31 MED ORDER — PANTOPRAZOLE SODIUM 40 MG PO TBEC
40.0000 mg | DELAYED_RELEASE_TABLET | Freq: Two times a day (BID) | ORAL | Status: DC
  Administered 2023-01-31 – 2023-02-04 (×8): 40 mg via ORAL
  Filled 2023-01-31 (×8): qty 1

## 2023-01-31 MED ORDER — PREDNISONE 20 MG PO TABS
40.0000 mg | ORAL_TABLET | Freq: Every day | ORAL | Status: DC
  Administered 2023-01-31 – 2023-02-03 (×4): 40 mg via ORAL
  Filled 2023-01-31 (×4): qty 2

## 2023-01-31 MED ORDER — ENSURE ENLIVE PO LIQD
237.0000 mL | Freq: Three times a day (TID) | ORAL | Status: DC
  Administered 2023-02-01 – 2023-02-03 (×3): 237 mL via ORAL

## 2023-01-31 NOTE — TOC Initial Note (Addendum)
Transition of Care [facility]) - Initial/Assessment Note    Patient Details  Name: [name] MRN: [id number] Date of Birth: Apr 17, 1946  Transition of Care [facility]) CM/SW Contact:    Candie Chroman, LCSW Phone Number: 01/31/2023, 11:51 AM  Clinical Narrative:   Readmission prevention screen complete. CSW met with patient. No supports at bedside. CSW introduced role and explained that discharge planning would be discussed. PCP is Benita Stabile, MD. Her two caregivers or her friend transport her to appointments. Pharmacy is [facility]. No issues obtaining medications other than patient reporting long lines. Patient has two caregivers. She was set up with [facility] liaison but they did not start services. Unsure reason. Patient confirmed she does not receive hospice services at home. Patient has wheelchair, RW, rollator, and BSC at home. Patient has home oxygen at home and is typically on 2 L. She was not sure which company provides it but per chart review it is [employer]. No further concerns. CSW encouraged patient to contact CSW as needed. CSW will continue to follow patient for support and facilitate return home once stable. Patient gave permission to contact her grandson/[facility]OA [facility] if needed. She stated one of her caregivers or her grandson will transport her home at discharge.             Expected Discharge Plan: Home/Self Care Barriers to Discharge: Continued Medical Work up   Patient Goals and CMS Choice            Expected Discharge Plan and Services     Post Acute Care Choice: NA Living arrangements for the past 2 months: Single Family Home                                      Prior Living Arrangements/Services Living arrangements for the past 2 months: Single Family Home Lives with:: Self Patient language and need for interpreter reviewed:: Yes Do you feel safe going back to the place where you live?: Yes      Need for Family Participation in  Patient Care: Yes (Comment) Care giver support system in place?: Yes (comment) Current home services: DME, Homehealth aide Criminal Activity/Legal Involvement Pertinent to Current Situation/Hospitalization: No - Comment as needed  Activities of Daily Living      Permission Sought/Granted Permission sought to share information with : Family Supports    Share Information with NAME: [place] granted to share info w Relationship: Grandson  Permission granted to share info w Contact Information: [phone]  Emotional Assessment Appearance:: Appears stated age Attitude/Demeanor/Rapport: Engaged, Gracious Affect (typically observed): Accepting, Appropriate, Calm, Pleasant Orientation: : Oriented to Self, Oriented to Place, Oriented to  Time, Oriented to Situation Alcohol / Substance Use: Not Applicable Psych Involvement: No (comment)  Admission diagnosis:  COPD exacerbation (HCC) [J44.1] Acute on chronic respiratory failure with hypoxia (HCC) [J96.21] Patient Active Problem List   Diagnosis Date Noted   CAD (coronary artery disease) 01/30/2023   Normocytic anemia 01/30/2023   Compression fracture of body of thoracic vertebra ([place]) 01/30/2023   Acute on chronic respiratory failure with hypoxemia ([place]) 01/11/2023   AMS (altered mental status) 11/08/2022   Underweight 11/08/2022   Acute on chronic respiratory failure with hypoxia ([place]) 06/30/2022   Osteomyelitis of fifth toe of right foot ([place]) 06/30/2022   Dyslipidemia 06/30/2022   Subacute osteomyelitis of right foot ([place])  Essential hypertension    Protein-calorie malnutrition, severe 11/21/2021   COPD with acute exacerbation ([place]) 11/21/2021   COPD exacerbation ([place]) 11/20/2021   Chronic pain - multiple sites arthritis 11/20/2021   Benzodiazepine dependence, continuous ([place]) 11/20/2021   Peripheral vascular disease ([place]) 11/20/2021   Hyponatremia 11/20/2021   Hypocalcemia 11/20/2021   Hypoalbuminemia due  to protein-calorie malnutrition ([place]) 11/20/2021   Anemia 11/20/2021   Acute respiratory failure with hypoxemia (HCC)    Benzodiazepine withdrawal without complication (HCC)    Malnutrition of moderate degree 10/20/2021   Absent pedal pulses 10/19/2021   Chronic hip pain (Left) 02/17/2021   Hip pain, acute (Left) 02/17/2021   Chronic use of opiate for therapeutic purpose 02/16/2021   Closed fracture of hip, sequela (Left) 02/07/2021   Fracture of femoral neck, left, closed ([place]) 01/11/2021   Chronic respiratory failure with hypoxia ([place]) 01/11/2021   Hypokalemia 01/11/2021   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 ([place]) 12/22/2020   PAD (peripheral artery disease) ([place]) 12/22/2020   Encounter for long-term opiate analgesic use 11/11/2020   Dry gangrene ([place]) of middle toe, right foot 11/11/2020   Foot pain, right 11/11/2020   Hard of hearing 11/11/2020   Atherosclerosis of native arteries of the extremities with gangrene ([place]) 12/09/2019   Vitamin D deficiency 11/12/2019   Pharmacologic therapy 06/04/2019   Disorder of skeletal system 06/04/2019   Problems influencing health status 06/04/2019   Compression fracture of L3 vertebra (HCC) 08/19/2018   Chronic hip pain (Bilateral) 12/31/2017   Neurogenic pain 08/29/2017   Chronic low back pain (1ry area of Pain) (Right) w/o sciatica 08/29/2017   Chronic sacroiliac joint pain (Right) 06/05/2017   Vitamin D insufficiency 03/12/2017   Right hip pain 02/20/2017   Intertrochanteric fracture of right hip, sequela 02/20/2017   B12 deficiency 02/05/2017   Pressure injury of skin 10/02/2016   Closed displaced intertrochanteric fracture of right femur ([place]) 10/02/2016   Hip fracture ([place]) 10/01/2016   Cervical facet hypertrophy (Bilateral) 05/27/2016   History of shoulder surgery 5 (Right) 05/27/2016   Lumbar foraminal stenosis (L3-4) (Left) 05/27/2016   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) 05/27/2016   Lumbar facet hypertrophy  (Bilateral) 05/27/2016   Lumbar facet syndrome (Right) 05/27/2016   Lumbar grade 1 Anterolisthesis of L3 over L4 05/27/2016   Chronic shoulder pain (Bilateral) (status post multiple surgeries) (R>L) 12/08/2015   Substance use disorder Risk: High 09/14/2015   Chronic pain syndrome 09/14/2015   Cervical spondylosis 09/14/2015   Chronic neck pain (2ry area of Pain) (Bilateral) (R>L) 09/14/2015   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) 09/14/2015   Cervical facet syndrome (Bilateral) (R>L) 09/14/2015   Cervical myofascial pain syndrome 09/14/2015   Lumbar spondylosis 09/14/2015   Chronic shoulder impingement syndrome (Right) 09/14/2015   Hypomagnesemia 09/14/2015   CRP elevated 09/14/2015   Elevated sedimentation rate 09/14/2015   Chronic obstructive pulmonary disease (COPD) ([place]) 09/14/2015   Nicotine dependence 09/14/2015   Chronic shoulder pain (Right) 09/14/2015   Abnormal nerve conduction studies 09/14/2015   Encounter for therapeutic drug level monitoring 09/09/2015   Long term current use of opiate analgesic 09/09/2015   Long term prescription opiate use [id number]   Uncomplicated opioid dependence ([place]) 09/09/2015   Opiate use 09/09/2015   Anxiety and depression 07/11/2012   Tobacco use disorder 05/27/2011   Fatigue 05/27/2011   Hypothyroidism 11/25/2010   HLD (hyperlipidemia) 08/24/2010   Tachycardia 08/24/2010   DYSPNEA 08/24/2010   PCP:  Hall Busing,  Leona Carry, MD Pharmacy:   [place], [place] - [address] [place] [address] Phone: [phone] Fax: [phone]  [id number] [place] [address]: [phone]: [id number]: [phone]: [address]Phone: [phone]: [phone]    Social Determinants of Health (SDOH) Social History: SDOH Screenings   Depression [phone]: Low Risk  (07/13/2021)  Tobacco Use: High Risk (01/30/2023)   SDOH Interventions:     Readmission Risk Interventions    01/31/2023   11:49 AM 07/05/2022   10:50 AM 11/25/2021    9:50 AM  Readmission Risk Prevention Plan  Transportation Screening Complete Complete Complete  Medication Review (RN Care Manager) Complete Complete Complete  PCP or Specialist appointment within 3-5 days of discharge Complete  Complete  HRI or Home Care Consult  Complete Complete  SW Recovery Care/Counseling Consult Complete Complete   Palliative Care Screening Not Applicable  Not [place] Not Applicable Not Applicable

## 2023-01-31 NOTE — Progress Notes (Signed)
  PROGRESS NOTE    [name] [facility]  MAU:[id number] DOB: 08/20/1946 DOA: 01/30/2023 PCP: Albina Billet, MD  236A/236A-AA  LOS: 1 day   Brief hospital course:   Assessment & Plan: [name] is a 77 y.o. female with medical history significant of COPD (on 2-3L oxygen), hypertension, hyperlipidemia, PVD, CAD, hypothyroidism, GERD, depression with anxiety, anemia, former smoker, medication noncompliance, who presents with shortness of breath.    Acute respiratory distress Chronic respiratory failure on 2-3L O2 at baseline CTA negative for PE.  Has COPD exacerbation.  Images showed patchy infiltration, cannot completely rule out the possibility of mild pneumonia.  CXR also showed mild pulm edema. --treat underlying causes  COPD exacerbation [facility]):  Patient has aroductive cough, wheezing, worsening shortness of breath, increased oxygen requirement, clinically consistent with COPD exacerbation. --received IV solumedrol 125 mg x1 in the ED --cont prednisone 40 mg daily --DuoNeb --cont empiric abx  Pulm edema --IV lasix 40  CAD (coronary artery disease): -ASA, Lipitor,   Essential hypertension: -IV hydralazine as needed -Amlodipine, metoprolol   Hypothyroidism: Continue Synthroid   Compression fracture of body of thoracic vertebra (HCC) -As needed Tylenol and Percocet -As needed Robaxin   Anxiety and depression: --cont home alprazolam PRN   Protein-calorie malnutrition, severe: Body weight 45.4 kg, BMI 17.17 --supplements per dietician    DVT prophylaxis: Lovenox SQ Code Status: DNR  Family Communication:  Level of care: Med-Surg Dispo:   The patient is from: home Anticipated d/c is to: home Anticipated d/c date is: 2-3 days   Subjective and Interval History:  Pt reported improvement in breathing, cough and sputum production.  Complained of severe GERD symptoms.   Objective: Vitals:   01/31/23 0848 01/31/23 1159 01/31/23 1525 01/31/23 1526  BP: 130/72 96/74  130/78   Pulse: 75 69 89   Resp: 18 18 18    Temp: 97.9 F (36.6 C) 97.7 F (36.5 C) 98.3 F (36.8 C)   TempSrc: Oral Oral Oral   SpO2: 99% (!) 72% 98% 98%  Weight:        Intake/Output Summary (Last 24 hours) at 01/31/2023 1924 Last data filed at 01/31/2023 1700 Gross per 24 hour  Intake 360 ml  Output 1150 ml  Net -790 ml   Filed Weights   01/30/23 1233  Weight: 53.5 kg    Examination:   Constitutional: NAD, AAOx3 HEENT: conjunctivae and lids normal, EOMI CV: No cyanosis.   RESP: normal respiratory effort, mild wheezes Neuro: II - XII grossly intact.   Psych: Normal mood and affect.  Appropriate judgement and reason   Data Reviewed: I have personally reviewed labs and imaging studies  Time spent: 50 minutes  Enzo Bi, MD [facility] If 7PM-7AM, please contact night-coverage 01/31/2023, 7:24 PM

## 2023-01-31 NOTE — Plan of Care (Signed)
Problem: Education: Goal: Knowledge of disease or condition will improve 01/31/2023 1033 by Jaimen Melone P, RN Outcome: Progressing 01/31/2023 1033 by Ardeen Jourdain, RN Outcome: Progressing Goal: Knowledge of the prescribed therapeutic regimen will improve 01/31/2023 1033 by Sowmya Partridge, Tamela Gammon, RN Outcome: Progressing 01/31/2023 1033 by Ardeen Jourdain, RN Outcome: Progressing Goal: Individualized Educational Video(s) 01/31/2023 1033 by Ardeen Jourdain, RN Outcome: Progressing 01/31/2023 1033 by Ardeen Jourdain, RN Outcome: Progressing   Problem: Activity: Goal: Ability to tolerate increased activity will improve 01/31/2023 1033 by Kilee Hedding P, RN Outcome: Progressing 01/31/2023 1033 by Ardeen Jourdain, RN Outcome: Progressing Goal: Will verbalize the importance of balancing activity with adequate rest periods 01/31/2023 1033 by Kairah Leoni P, RN Outcome: Progressing 01/31/2023 1033 by Nayleen Janosik P, RN Outcome: Progressing   Problem: Respiratory: Goal: Ability to maintain a clear airway will improve 01/31/2023 1033 by Daejon Lich P, RN Outcome: Progressing 01/31/2023 1033 by Candace Cruise P, RN Outcome: Progressing Goal: Levels of oxygenation will improve 01/31/2023 1033 by Catharina Pica P, RN Outcome: Progressing 01/31/2023 1033 by Candace Cruise P, RN Outcome: Progressing Goal: Ability to maintain adequate ventilation will improve 01/31/2023 1033 by Fraida Veldman P, RN Outcome: Progressing 01/31/2023 1033 by Ardeen Jourdain, RN Outcome: Progressing   Problem: Activity: Goal: Ability to tolerate increased activity will improve 01/31/2023 1033 by Jonathen Rathman P, RN Outcome: Progressing 01/31/2023 1033 by Ardeen Jourdain, RN Outcome: Progressing   Problem: Clinical Measurements: Goal: Ability to maintain a body temperature in the normal range will improve 01/31/2023 1033 by Delorese Sellin P, RN Outcome:  Progressing 01/31/2023 1033 by Kahlen Morais P, RN Outcome: Progressing   Problem: Respiratory: Goal: Ability to maintain adequate ventilation will improve 01/31/2023 1033 by Lylee Corrow P, RN Outcome: Progressing 01/31/2023 1033 by Ardeen Jourdain, RN Outcome: Progressing Goal: Ability to maintain a clear airway will improve 01/31/2023 1033 by Tavi Hoogendoorn P, RN Outcome: Progressing 01/31/2023 1033 by Ardeen Jourdain, RN Outcome: Progressing   Problem: Education: Goal: Knowledge of General Education information will improve Description: Including pain rating scale, medication(s)/side effects and non-pharmacologic comfort measures 01/31/2023 1033 by Ardeen Jourdain, RN Outcome: Progressing 01/31/2023 1033 by Ardeen Jourdain, RN Outcome: Progressing   Problem: Health Behavior/Discharge Planning: Goal: Ability to manage health-related needs will improve 01/31/2023 1033 by Izza Bickle P, RN Outcome: Progressing 01/31/2023 1033 by Candace Cruise P, RN Outcome: Progressing   Problem: Clinical Measurements: Goal: Ability to maintain clinical measurements within normal limits will improve 01/31/2023 1033 by Maiyah Goyne P, RN Outcome: Progressing 01/31/2023 1033 by Candace Cruise P, RN Outcome: Progressing Goal: Will remain free from infection 01/31/2023 1033 by Muriel Hannold P, RN Outcome: Progressing 01/31/2023 1033 by Ardeen Jourdain, RN Outcome: Progressing Goal: Diagnostic test results will improve 01/31/2023 1033 by Terrea Bruster P, RN Outcome: Progressing 01/31/2023 1033 by Ardeen Jourdain, RN Outcome: Progressing Goal: Respiratory complications will improve 01/31/2023 1033 by Armonte Tortorella P, RN Outcome: Progressing 01/31/2023 1033 by Ardeen Jourdain, RN Outcome: Progressing Goal: Cardiovascular complication will be avoided 01/31/2023 1033 by Kriston Pasquarello P, RN Outcome: Progressing 01/31/2023 1033 by Ardeen Jourdain,  RN Outcome: Progressing   Problem: Activity: Goal: Risk for activity intolerance will decrease 01/31/2023 1033 by Jaycelyn Orrison P, RN Outcome: Progressing 01/31/2023 1033 by Candace Cruise P, RN Outcome: Progressing   Problem: Nutrition: Goal: Adequate nutrition will be maintained 01/31/2023 1033 by Ardeen Jourdain, RN Outcome: Progressing 01/31/2023 1033 by Ardeen Jourdain, RN  Outcome: Progressing   Problem: Coping: Goal: Level of anxiety will decrease 01/31/2023 1033 by Jackline Castilla P, RN Outcome: Progressing 01/31/2023 1033 by Merland Holness P, RN Outcome: Progressing   Problem: Elimination: Goal: Will not experience complications related to bowel motility 01/31/2023 1033 by Elsbeth Yearick P, RN Outcome: Progressing 01/31/2023 1033 by Ardeen Jourdain, RN Outcome: Progressing Goal: Will not experience complications related to urinary retention 01/31/2023 1033 by Taelyn Broecker P, RN Outcome: Progressing 01/31/2023 1033 by Candace Cruise P, RN Outcome: Progressing   Problem: Pain Managment: Goal: General experience of comfort will improve 01/31/2023 1033 by Tanika Bracco P, RN Outcome: Progressing 01/31/2023 1033 by Candace Cruise P, RN Outcome: Progressing   Problem: Safety: Goal: Ability to remain free from injury will improve 01/31/2023 1033 by Contina Strain P, RN Outcome: Progressing 01/31/2023 1033 by Layli Capshaw P, RN Outcome: Progressing   Problem: Skin Integrity: Goal: Risk for impaired skin integrity will decrease 01/31/2023 1033 by Murlin Schrieber P, RN Outcome: Progressing 01/31/2023 1033 by Ardeen Jourdain, RN Outcome: Progressing

## 2023-01-31 NOTE — Progress Notes (Signed)
Initial Nutrition Assessment  DOCUMENTATION CODES:   Severe malnutrition in context of chronic illness  INTERVENTION:   -Increase Ensure Enlive po to TID, each supplement provides 350 kcal and 20 grams of protein -Liberalize diet to regular for widest variety of meal selections -MVI with minerals daily  NUTRITION DIAGNOSIS:   Severe Malnutrition related to chronic illness (COPD) as evidenced by severe fat depletion, severe muscle depletion.  GOAL:   Patient will meet greater than or equal to 90% of their needs  MONITOR:   PO intake, Supplement acceptance  REASON FOR ASSESSMENT:   Consult Assessment of nutrition requirement/status  ASSESSMENT:   Pt with medical history significant of COPD (on 2-3L oxygen), hypertension, hyperlipidemia, PVD, CAD, hypothyroidism, GERD, depression with anxiety, anemia, former smoker, medication noncompliance, who presents with shortness of breath.  Pt admitted with COPD exacerbation.   Reviewed I/O's: +916 ml x 24 hours  UOP: 450 ml x 24 hours  Pt familiar to this RD due to multiple previous admissions. Pt with long standing history of malnutrition. Per previous admission notes, pt was considering hospice on last admission.   Spoke with pt at bedside, who arose easily from sleep. Pt reoriented to time, place, and situation. She reports she has not eaten anything today yet. She reports she was eating well at home, but unable to provide further insight into this. Pt easily distracted and reports concern about her dog who lives with her; she shares that she has a caregiver and grandson who come in and check on her daily.   Reviewed wt hx; no wt loss noted over the past 3 months.   Discussed importance of good meal and supplement intake to promote healing.   Medications reviewed and include thiamine.   No results found for: "HGBA1C" PTA DM medications are none.   Labs reviewed: CBGS: 107 (inpatient orders for glycemic control are none).     NUTRITION - FOCUSED PHYSICAL EXAM:  Flowsheet Row Most Recent Value  Orbital Region Severe depletion  Upper Arm Region Severe depletion  Thoracic and Lumbar Region Severe depletion  Buccal Region Severe depletion  Temple Region Severe depletion  Clavicle Bone Region Severe depletion  Clavicle and Acromion Bone Region Severe depletion  Scapular Bone Region Severe depletion  Dorsal Hand Severe depletion  Patellar Region Severe depletion  Anterior Thigh Region Severe depletion  Posterior Calf Region Severe depletion  Edema (RD Assessment) None  Hair Reviewed  Eyes Reviewed  Mouth Reviewed  Skin Reviewed  Nails Reviewed       Diet Order:   Diet Order             Diet Heart Room service appropriate? Yes; Fluid consistency: Thin  Diet effective now                   EDUCATION NEEDS:   Education needs have been addressed  Skin:  Skin Assessment: Reviewed RN Assessment  Last BM:  01/30/23  Height:   Ht Readings from Last 1 Encounters:  01/10/23 '5\' 4"'$  (D34-534 m)    Weight:   Wt Readings from Last 1 Encounters:  01/30/23 53.5 kg    Ideal Body Weight:  54.5 kg  BMI:  Body mass index is 20.25 kg/m.  Estimated Nutritional Needs:   Kcal:  1650-1850  Protein:  80-95 grams  Fluid:  > 1.6 L    Loistine Chance, RD, LDN, [place] Registered Dietitian II Certified Diabetes Care and Education Specialist Please refer to [facility] for RD and/or RD  on-call/weekend/after hours pager

## 2023-02-01 ENCOUNTER — Ambulatory Visit: Payer: Medicare HMO | Admitting: Podiatry

## 2023-02-01 DIAGNOSIS — J441 Chronic obstructive pulmonary disease with (acute) exacerbation: Secondary | ICD-10-CM | POA: Diagnosis not present

## 2023-02-01 LAB — BASIC METABOLIC PANEL
Anion gap: 8 (ref 5–15)
BUN: 14 mg/dL (ref 8–23)
CO2: 29 mmol/L (ref 22–32)
Calcium: 7.9 mg/dL — ABNORMAL LOW (ref 8.9–10.3)
Chloride: 103 mmol/L (ref 98–111)
Creatinine, Ser: 0.5 mg/dL (ref 0.44–1.00)
GFR, Estimated: >60 mL/min (ref >60)
Glucose, Bld: 88 mg/dL (ref 70–99)
Potassium: 2.9 mmol/L — ABNORMAL LOW (ref 3.5–5.1)
Sodium: 140 mmol/L (ref 135–145)

## 2023-02-01 LAB — CBC
HCT: 29.1 % — ABNORMAL LOW (ref 36.0–46.0)
Hemoglobin: 9 g/dL — ABNORMAL LOW (ref 12.0–15.0)
MCH: 27.8 pg (ref 26.0–34.0)
MCHC: 30.9 g/dL (ref 30.0–36.0)
MCV: 89.8 fL (ref 80.0–100.0)
Platelets: 239 10*3/uL (ref 150–400)
RBC: 3.24 MIL/uL — ABNORMAL LOW (ref 3.87–5.11)
RDW: 15.4 % (ref 11.5–15.5)
WBC: 10.7 10*3/uL — ABNORMAL HIGH (ref 4.0–10.5)
nRBC: 0 % (ref 0.0–0.2)

## 2023-02-01 LAB — MAGNESIUM: Magnesium: 1.5 mg/dL — ABNORMAL LOW (ref 1.7–2.4)

## 2023-02-01 MED ORDER — MAGNESIUM SULFATE 4 GM/100ML IV SOLN
4.0000 g | Freq: Once | INTRAVENOUS | Status: AC
  Administered 2023-02-01: 4 g via INTRAVENOUS
  Filled 2023-02-01: qty 100

## 2023-02-01 MED ORDER — FUROSEMIDE 10 MG/ML IJ SOLN
40.0000 mg | Freq: Once | INTRAMUSCULAR | Status: AC
  Administered 2023-02-01: 40 mg via INTRAVENOUS
  Filled 2023-02-01: qty 4

## 2023-02-01 MED ORDER — IPRATROPIUM-ALBUTEROL 0.5-2.5 (3) MG/3ML IN SOLN
3.0000 mL | Freq: Two times a day (BID) | RESPIRATORY_TRACT | Status: DC
  Administered 2023-02-01 – 2023-02-02 (×2): 3 mL via RESPIRATORY_TRACT
  Filled 2023-02-01 (×2): qty 3

## 2023-02-01 MED ORDER — POTASSIUM CHLORIDE CRYS ER 20 MEQ PO TBCR
40.0000 meq | EXTENDED_RELEASE_TABLET | ORAL | Status: AC
  Administered 2023-02-01 (×2): 40 meq via ORAL
  Filled 2023-02-01 (×2): qty 2

## 2023-02-01 NOTE — Plan of Care (Signed)

## 2023-02-01 NOTE — Progress Notes (Addendum)
  PROGRESS NOTE    [name]  NTI:[id number] DOB: [birth year] DOA: 01/30/2023 PCP: Albina Billet, MD  153A/153A-AA  LOS: 2 days   Brief hospital course:   Assessment & Plan: [name] is a 77 y.o. female with medical history significant of COPD (on 2-3L oxygen), hypertension, hyperlipidemia, PVD, CAD, hypothyroidism, GERD, depression with anxiety, anemia, former smoker, medication noncompliance, who presents with shortness of breath.    Acute respiratory distress Chronic respiratory failure on 2-3L O2 at baseline CTA negative for PE.  Has COPD exacerbation.  Images showed patchy infiltration, cannot completely rule out the possibility of mild pneumonia.  CXR also showed mild pulm edema. --treat underlying causes  COPD exacerbation [facility]):  Patient has aroductive cough, wheezing, worsening shortness of breath, increased oxygen requirement, clinically consistent with COPD exacerbation. --received IV solumedrol 125 mg x1 in the ED --cont prednisone 40 mg daily --DuoNeb --cont empiric ceftriaxone and doxy  Acute Pulm edema --IV lasix 40 mg daily  CAD (coronary artery disease): -ASA, Lipitor,   Essential hypertension: -IV hydralazine as needed -Amlodipine, metoprolol   Hypothyroidism: Continue Synthroid   Compression fracture of body of thoracic vertebra (HCC) -As needed Tylenol and Percocet -As needed Robaxin   Anxiety and depression: --cont home alprazolam PRN   Protein-calorie malnutrition, severe: Body weight 45.4 kg, BMI 17.17 --supplements per dietician   Hypokalemia Hypomag --2/2 diuresis --monitor and replete PRN   DVT prophylaxis: Lovenox SQ Code Status: DNR  Family Communication:  Level of care: Med-Surg Dispo:   The patient is from: home Anticipated d/c is to: home Anticipated d/c date is: 2-3 days   Subjective and Interval History:  Pt reported dyspnea improved.  Complained of a lot of issues with feeling of food stuck in her throat and  regurgitation.    Objective: Vitals:   02/01/23 1105 02/01/23 1558 02/01/23 1559 02/01/23 1808  BP: (!) 97/59 (!) 103/52 (!) 103/52 119/64  Pulse: 65 74 73 83  Resp: 19 18 18 17   Temp: 97.9 F (36.6 C) 98.2 F (36.8 C) 98.2 F (36.8 C) 98.3 F (36.8 C)  TempSrc: Oral     SpO2: 97% 97% 98% 94%  Weight:        Intake/Output Summary (Last 24 hours) at 02/01/2023 1830 Last data filed at 02/01/2023 1744 Gross per 24 hour  Intake 1200 ml  Output 2400 ml  Net -1200 ml   Filed Weights   01/30/23 1233  Weight: 53.5 kg    Examination:   Constitutional: NAD, AAOx3 HEENT: conjunctivae and lids normal, EOMI CV: No cyanosis.   RESP: normal respiratory effort Neuro: II - XII grossly intact.     Data Reviewed: I have personally reviewed labs and imaging studies  Time spent: 35 minutes  Enzo Bi, MD [facility] If 7PM-7AM, please contact night-coverage 02/01/2023, 6:30 PM

## 2023-02-02 ENCOUNTER — Encounter: Payer: Self-pay | Admitting: Internal Medicine

## 2023-02-02 ENCOUNTER — Inpatient Hospital Stay: Payer: Medicare HMO

## 2023-02-02 DIAGNOSIS — J441 Chronic obstructive pulmonary disease with (acute) exacerbation: Secondary | ICD-10-CM | POA: Diagnosis not present

## 2023-02-02 LAB — BASIC METABOLIC PANEL
Anion gap: 10 (ref 5–15)
BUN: 15 mg/dL (ref 8–23)
CO2: 32 mmol/L (ref 22–32)
Calcium: 8.4 mg/dL — ABNORMAL LOW (ref 8.9–10.3)
Chloride: 101 mmol/L (ref 98–111)
Creatinine, Ser: 0.5 mg/dL (ref 0.44–1.00)
GFR, Estimated: >60 mL/min (ref >60)
Glucose, Bld: 94 mg/dL (ref 70–99)
Potassium: 3.5 mmol/L (ref 3.5–5.1)
Sodium: 143 mmol/L (ref 135–145)

## 2023-02-02 LAB — CBC
HCT: 29.9 % — ABNORMAL LOW (ref 36.0–46.0)
Hemoglobin: 9.2 g/dL — ABNORMAL LOW (ref 12.0–15.0)
MCH: 27.6 pg (ref 26.0–34.0)
MCHC: 30.8 g/dL (ref 30.0–36.0)
MCV: 89.8 fL (ref 80.0–100.0)
Platelets: 225 10*3/uL (ref 150–400)
RBC: 3.33 MIL/uL — ABNORMAL LOW (ref 3.87–5.11)
RDW: 15.4 % (ref 11.5–15.5)
WBC: 5.6 10*3/uL (ref 4.0–10.5)
nRBC: 0 % (ref 0.0–0.2)

## 2023-02-02 LAB — MAGNESIUM: Magnesium: 2 mg/dL (ref 1.7–2.4)

## 2023-02-02 MED ORDER — FUROSEMIDE 10 MG/ML IJ SOLN
40.0000 mg | Freq: Two times a day (BID) | INTRAMUSCULAR | Status: AC
  Administered 2023-02-02: 40 mg via INTRAVENOUS
  Filled 2023-02-02 (×2): qty 4

## 2023-02-02 NOTE — Plan of Care (Signed)
  Problem: Health Behavior/Discharge Planning: Goal: Ability to manage health-related needs will improve Outcome: Progressing   Problem: Clinical Measurements: Goal: Ability to maintain clinical measurements within normal limits will improve Outcome: Progressing Goal: Diagnostic test results will improve Outcome: Progressing Goal: Respiratory complications will improve Outcome: Progressing Goal: Cardiovascular complication will be avoided Outcome: Progressing   Problem: Activity: Goal: Risk for activity intolerance will decrease Outcome: Progressing   Problem: Elimination: Goal: Will not experience complications related to bowel motility Outcome: Progressing Goal: Will not experience complications related to urinary retention Outcome: Progressing   Problem: Pain Managment: Goal: General experience of comfort will improve Outcome: Progressing

## 2023-02-02 NOTE — Progress Notes (Signed)
SLP Cancellation Note  Patient Details Name: [name] MRN: [id number] DOB: 1946/07/10   Cancelled treatment:       Reason Eval/Treat Not Completed: Other (comment)  Pt's s/s of severe GERD, regurgitation would be best evaluated by GI or possible DG Esophagus as these are not related to oropharyngeal dysphagia.   Secure chat with MD making her aware with information provided to MD on radiology and number to call for assistance in selecting appropriate study.   [name] B. Rutherford Nail, M.S., CCC-SLP, [place] Pathologist Certified Brain Injury [place]  [place] Office [phone] Ascom [phone] Fax [phone]  Stormy Fabian 02/02/2023, 1:56 PM

## 2023-02-02 NOTE — Care Management Important Message (Signed)
Important Message  Patient Details  Name: [name] MRN: [id number] Date of Birth: Sep 29, 1946   Medicare Important Message Given:  Yes     Dannette Barbara 02/02/2023, 11:11 AM

## 2023-02-02 NOTE — Progress Notes (Signed)
  PROGRESS NOTE    [name] [facility]  [id number] DOB: June 02, 1946 DOA: 01/30/2023 PCP: Albina Billet, MD  153A/153A-AA  LOS: 3 days   Brief hospital course:   Assessment & Plan: [name] is a 77 y.o. female with medical history significant of COPD (on 2-3L oxygen), hypertension, hyperlipidemia, PVD, CAD, hypothyroidism, GERD, depression with anxiety, anemia, former smoker, medication noncompliance, who presents with shortness of breath.    Acute respiratory distress Chronic respiratory failure on 2-3L O2 at baseline CTA negative for PE.  Has COPD exacerbation.  Images showed patchy infiltration, cannot completely rule out the possibility of mild pneumonia.  CXR also showed mild pulm edema. --treat underlying causes  COPD exacerbation [facility]):  Patient has aroductive cough, wheezing, worsening shortness of breath, increased oxygen requirement, clinically consistent with COPD exacerbation. --received IV solumedrol 125 mg x1 in the ED --cont prednisone 40 mg daily --DuoNeb --cont ceftriaxone and doxy  Acute Pulm edema --IV lasix 40 mg daily  CAD (coronary artery disease): -ASA, Lipitor,   Essential hypertension: -IV hydralazine as needed -Amlodipine, metoprolol   Hypothyroidism: Continue Synthroid   Compression fracture of body of thoracic vertebra (HCC) -As needed Tylenol and Percocet -As needed Robaxin   Anxiety and depression: --cont home alprazolam PRN   Protein-calorie malnutrition, severe: Body weight 45.4 kg, BMI 17.17 --supplements per dietician   Hypokalemia Hypomag --2/2 diuresis --monitor and replete PRN  Esophageal dysmotility --Pt reported severe GERD symptoms.  DG esophagus attempted today, very limited study due to pt intolerance, but noted dysmotility/esophageal spasms, no stricture seen. --outpatient GI f/u   DVT prophylaxis: Lovenox SQ Code Status: DNR  Family Communication:  Level of care: Med-Surg Dispo:   The patient is from:  home Anticipated d/c is to: home Anticipated d/c date is: tomorrow   Subjective and Interval History:  Pt reported good urine output and improved breathing.  Went for [employer] esophagus today, study very limited, per radiology "unable to tolerate the procedure table, unable to lay flat, unable to be upright. Everything hurt on her and she couldn't breath. She was only able to take one swallow of barium and said she was going to be sick. I saw a small hernia and dysmotility/esophageal spasms."  No stricture seen.   Objective: Vitals:   02/02/23 0700 02/02/23 0903 02/02/23 1541 02/02/23 1621  BP:  (!) 149/91  107/62  Pulse: 63 83  65  Resp: 18 16  16   Temp:  98.2 F (36.8 C)  98.1 F (36.7 C)  TempSrc:      SpO2: 100% 100%  100%  Weight:      Height:   5\' 4"  (1.626 m)     Intake/Output Summary (Last 24 hours) at 02/02/2023 1915 Last data filed at 02/02/2023 1730 Gross per 24 hour  Intake 740 ml  Output 1900 ml  Net -1160 ml   Filed Weights   01/30/23 1233  Weight: 53.5 kg    Examination:   Constitutional: NAD, AAOx3 HEENT: conjunctivae and lids normal, EOMI CV: No cyanosis.   RESP: normal respiratory effort, on  Extremities: No effusions, edema in BLE SKIN: warm, dry Neuro: II - XII grossly intact.     Data Reviewed: I have personally reviewed labs and imaging studies  Time spent: 35 minutes  Enzo Bi, MD [facility] If 7PM-7AM, please contact night-coverage 02/02/2023, 7:15 PM

## 2023-02-03 DIAGNOSIS — J441 Chronic obstructive pulmonary disease with (acute) exacerbation: Secondary | ICD-10-CM | POA: Diagnosis not present

## 2023-02-03 LAB — MAGNESIUM: Magnesium: 1.5 mg/dL — ABNORMAL LOW (ref 1.7–2.4)

## 2023-02-03 LAB — CBC
HCT: 30.7 % — ABNORMAL LOW (ref 36.0–46.0)
Hemoglobin: 9.7 g/dL — ABNORMAL LOW (ref 12.0–15.0)
MCH: 28 pg (ref 26.0–34.0)
MCHC: 31.6 g/dL (ref 30.0–36.0)
MCV: 88.7 fL (ref 80.0–100.0)
Platelets: 242 10*3/uL (ref 150–400)
RBC: 3.46 MIL/uL — ABNORMAL LOW (ref 3.87–5.11)
RDW: 15.1 % (ref 11.5–15.5)
WBC: 6.9 10*3/uL (ref 4.0–10.5)
nRBC: 0 % (ref 0.0–0.2)

## 2023-02-03 LAB — BASIC METABOLIC PANEL
Anion gap: 9 (ref 5–15)
BUN: 13 mg/dL (ref 8–23)
CO2: 34 mmol/L — ABNORMAL HIGH (ref 22–32)
Calcium: 8.3 mg/dL — ABNORMAL LOW (ref 8.9–10.3)
Chloride: 97 mmol/L — ABNORMAL LOW (ref 98–111)
Creatinine, Ser: 0.65 mg/dL (ref 0.44–1.00)
GFR, Estimated: >60 mL/min (ref >60)
Glucose, Bld: 88 mg/dL (ref 70–99)
Potassium: 3 mmol/L — ABNORMAL LOW (ref 3.5–5.1)
Sodium: 140 mmol/L (ref 135–145)

## 2023-02-03 MED ORDER — DOXYCYCLINE HYCLATE 100 MG PO TABS: 100.0000 mg | ORAL_TABLET | Freq: Two times a day (BID) | ORAL | Status: DC

## 2023-02-03 MED ORDER — ENSURE ENLIVE PO LIQD: 237.0000 mL | Freq: Three times a day (TID) | ORAL | 12 refills | Status: DC

## 2023-02-03 MED ORDER — POTASSIUM CHLORIDE CRYS ER 20 MEQ PO TBCR
40.0000 meq | EXTENDED_RELEASE_TABLET | ORAL | Status: AC
  Administered 2023-02-03 (×2): 40 meq via ORAL
  Filled 2023-02-03 (×2): qty 2

## 2023-02-03 MED ORDER — MAGNESIUM SULFATE 4 GM/100ML IV SOLN
4.0000 g | Freq: Once | INTRAVENOUS | Status: DC
  Filled 2023-02-03: qty 100

## 2023-02-03 MED ORDER — FUROSEMIDE 10 MG/ML IJ SOLN: 40.0000 mg | Freq: Two times a day (BID) | INTRAMUSCULAR | Status: DC

## 2023-02-03 MED ORDER — MAGNESIUM OXIDE -MG SUPPLEMENT 400 (240 MG) MG PO TABS
800.0000 mg | ORAL_TABLET | Freq: Two times a day (BID) | ORAL | Status: DC
  Administered 2023-02-03 – 2023-02-04 (×3): 800 mg via ORAL
  Filled 2023-02-03 (×3): qty 2

## 2023-02-03 MED ORDER — OXYCODONE-ACETAMINOPHEN 5-325 MG PO TABS: 1.0000 | ORAL_TABLET | Freq: Four times a day (QID) | ORAL | 0 refills | Status: AC | PRN

## 2023-02-03 MED ORDER — POLYETHYLENE GLYCOL 3350 17 G PO PACK
34.0000 g | PACK | ORAL | Status: AC
  Administered 2023-02-03 (×3): 34 g via ORAL
  Filled 2023-02-03 (×3): qty 2

## 2023-02-03 MED ORDER — FUROSEMIDE 40 MG PO TABS
40.0000 mg | ORAL_TABLET | Freq: Every day | ORAL | Status: DC
  Administered 2023-02-03 – 2023-02-04 (×2): 40 mg via ORAL
  Filled 2023-02-03 (×2): qty 1

## 2023-02-03 MED ORDER — SODIUM CHLORIDE 0.9 % IV SOLN
1.0000 g | Freq: Once | INTRAVENOUS | Status: DC
  Administered 2023-02-03: 1 g via INTRAVENOUS
  Filled 2023-02-03: qty 10

## 2023-02-03 MED ORDER — LEVOFLOXACIN 750 MG PO TABS
750.0000 mg | ORAL_TABLET | Freq: Once | ORAL | Status: AC
  Administered 2023-02-03: 750 mg via ORAL
  Filled 2023-02-03: qty 1

## 2023-02-03 NOTE — Progress Notes (Signed)
  PROGRESS NOTE    [name] [facility]  [id number] DOB: [birth year] DOA: 01/30/2023 PCP: Albina Billet, MD  153A/153A-AA  LOS: 4 days   Brief hospital course:   Assessment & Plan: [name] is a 77 y.o. female with medical history significant of COPD (on 2-3L oxygen), hypertension, hyperlipidemia, PVD, CAD, hypothyroidism, GERD, depression with anxiety, anemia, former smoker, medication noncompliance, who presents with shortness of breath.    Acute respiratory distress Chronic respiratory failure on 2-3L O2 at baseline CTA negative for PE.  Has COPD exacerbation.  Images showed patchy infiltration, cannot completely rule out the possibility of mild pneumonia.  CXR also showed mild pulm edema. --treat underlying causes  COPD exacerbation [facility]):  Patient has aroductive cough, wheezing, worsening shortness of breath, increased oxygen requirement, clinically consistent with COPD exacerbation. --received IV solumedrol 125 mg x1 in the ED --cont prednisone 40 mg daily --DuoNeb --cont ceftriaxone and doxy  Acute Pulm edema --started on IV lasix 40 mg daily --transition to oral lasix 40 mg dialy today  CAD (coronary artery disease): -ASA, Lipitor,   Essential hypertension: -IV hydralazine as needed -Amlodipine, metoprolol --cont lasix   Hypothyroidism: Continue Synthroid   Compression fracture of body of thoracic vertebra (HCC) -As needed Tylenol and Percocet -As needed Robaxin   Anxiety and depression: --cont home alprazolam PRN   Protein-calorie malnutrition, severe: Body weight 45.4 kg, BMI 17.17 --supplements per dietician   Hypokalemia Hypomag --2/2 diuresis --monitor and replete PRN  Esophageal dysmotility --Pt reported severe GERD symptoms.  DG esophagus attempted on 3/15, very limited study due to pt intolerance, but noted dysmotility/esophageal spasms, no stricture seen. --outpatient GI f/u   DVT prophylaxis: Lovenox SQ Code Status: DNR  Family  Communication: [name] (daughter in Sports coach, [place]) updated on the phone today Level of care: Med-Surg Dispo:   The patient is from: home Anticipated d/c is to: home Anticipated d/c date is: tomorrow   Subjective and Interval History:  Pt reported feeling weak.    PT eval, found pt to be moving ok, but limited by dyspnea.   Objective: Vitals:   02/02/23 2151 02/03/23 0040 02/03/23 0833 02/03/23 1540  BP: (!) 135/98 123/71 (!) 128/59 (!) 99/57  Pulse: 66 (!) 58 (!) 58 63  Resp: 18 19 16 16   Temp:  (!) 97.3 F (36.3 C) 98 F (36.7 C) 98.6 F (37 C)  TempSrc:      SpO2: 99% 99% 98% 97%  Weight:      Height:        Intake/Output Summary (Last 24 hours) at 02/03/2023 1656 Last data filed at 02/03/2023 1551 Gross per 24 hour  Intake 360 ml  Output 3600 ml  Net -3240 ml   Filed Weights   01/30/23 1233  Weight: 53.5 kg    Examination:   Constitutional: NAD, AAOx3 HEENT: conjunctivae and lids normal, EOMI, hard of hearing CV: No cyanosis.   RESP: normal respiratory effort, on 2L Neuro: II - XII grossly intact.     Data Reviewed: I have personally reviewed labs and imaging studies  Time spent: 35 minutes  Enzo Bi, MD [facility] If 7PM-7AM, please contact night-coverage 02/03/2023, 4:56 PM

## 2023-02-03 NOTE — Plan of Care (Signed)
  Problem: Health Behavior/Discharge Planning: Goal: Ability to manage health-related needs will improve Outcome: Progressing   Problem: Clinical Measurements: Goal: Diagnostic test results will improve Outcome: Progressing Goal: Respiratory complications will improve Outcome: Progressing Goal: Cardiovascular complication will be avoided Outcome: Progressing   Problem: Activity: Goal: Risk for activity intolerance will decrease Outcome: Progressing   Problem: Nutrition: Goal: Adequate nutrition will be maintained Outcome: Progressing   Problem: Pain Managment: Goal: General experience of comfort will improve Outcome: Progressing

## 2023-02-03 NOTE — Evaluation (Signed)
Physical Therapy Evaluation Patient Details Name: [name] MRN: [id number] DOB: [birth year] Today's Date: 02/03/2023  History of Present Illness  [name] is a 77 y.o. female with medical history significant of COPD (on 2-3L oxygen), hypertension, hyperlipidemia, PVD, CAD, hypothyroidism, GERD, depression with anxiety, anemia, former smoker, medication noncompliance, who presents with shortness of breath.  Clinical Impression  Patient received in recliner, she is very [place]. Patient agreeable to PT assessment. She wants to go home, but is fearful of going home and being able to manage condition. Patient has unreliable care givers at home. She is able to stand with min guard and ambulated 12 feet with RW and min guard. Patient generally impulsive and unsteady. Reports multiple falls. She really would benefit from more assistance at home to help with medications and safety. She will continue to benefit from skilled PT to improve safety and independence.       Recommendations for follow up therapy are one component of a multi-disciplinary discharge planning process, led by the attending physician.  Recommendations may be updated based on patient status, additional functional criteria and insurance authorization.  Follow Up Recommendations Home health PT      Assistance Recommended at Discharge Frequent or constant Supervision/Assistance  Patient can return home with the following  A little help with walking and/or transfers;A little help with bathing/dressing/bathroom;Assist for transportation;Help with stairs or ramp for entrance;Assistance with cooking/housework;Direct supervision/assist for medications management    Equipment Recommendations None recommended by PT  Recommendations for Other Services       Functional Status Assessment Patient has had a recent decline in their functional status and demonstrates the ability to make significant improvements in function in a reasonable  and predictable amount of time.     Precautions / Restrictions Precautions Precautions: Fall Restrictions Weight Bearing Restrictions: No      Mobility  Bed Mobility               General bed mobility comments: Not assessed, patient up in recliner    Transfers Overall transfer level: Needs assistance Equipment used: Rolling walker (2 wheels) Transfers: Sit to/from Stand Sit to Stand: Min guard                Ambulation/Gait Ambulation/Gait assistance: Min guard Gait Distance (Feet): 12 Feet Assistive device: Rolling walker (2 wheels) Gait Pattern/deviations: Step-through pattern, Decreased step length - right, Decreased step length - left, Trunk flexed Gait velocity: WFL     General Gait Details: Generally impulsive. SOB with short distance. Unable to get saturation reading due to poor pleth. Patient is visually and verbalized SOB after this distance.  Stairs            Wheelchair Mobility    Modified Rankin (Stroke Patients Only)       Balance Overall balance assessment: Needs assistance, History of Falls Sitting-balance support: Feet supported Sitting balance-Leahy Scale: Good     Standing balance support: Bilateral upper extremity supported, During functional activity, Reliant on assistive device for balance Standing balance-Leahy Scale: Fair                               Pertinent Vitals/Pain Pain Assessment Pain Assessment: No/denies pain    Home Living Family/patient expects to be discharged to:: Private residence Living Arrangements: Alone Available Help at Discharge: Family;Personal care attendant;Available PRN/intermittently;Other (Comment) (states she has 2 personal care attendants who are un-reliable and she is at the  home some by herself. They are not there 24/7) Type of Home: House Home Access: Stairs to enter Entrance Stairs-Rails: Left Entrance Stairs-Number of Steps: 4   Home Layout: One level Home  Equipment: Conservation officer, nature (2 wheels);Rollator (4 wheels);Shower seat;Cane - single point Additional Comments: information obtained from prior docmentation, pt very difficult to get consistent answers to questions    Prior Function Prior Level of Function : Needs assist             Mobility Comments: amb with rollator, states she has been spending a lot of time on the couch ADLs Comments: pt performs ADL with SET UP, aids assit with IADLs,     Hand Dominance   Dominant Hand: Right    Extremity/Trunk Assessment   Upper Extremity Assessment Upper Extremity Assessment: Generalized weakness    Lower Extremity Assessment Lower Extremity Assessment: Generalized weakness    Cervical / Trunk Assessment Cervical / Trunk Assessment: Normal  Communication   Communication: HOH  Cognition Arousal/Alertness: Awake/alert Behavior During Therapy: Restless, Impulsive Overall Cognitive Status: No family/caregiver present to determine baseline cognitive functioning                                          General Comments      Exercises     Assessment/Plan    PT Assessment Patient needs continued PT services  PT Problem List Decreased strength;Decreased range of motion;Decreased activity tolerance;Decreased balance;Decreased mobility;Decreased safety awareness;Decreased knowledge of use of DME;Decreased cognition;Cardiopulmonary status limiting activity;Decreased coordination       PT Treatment Interventions DME instruction;Gait training;Functional mobility training;Therapeutic activities;Stair training;Therapeutic exercise;Balance training;Neuromuscular re-education;Patient/family education    PT Goals (Current goals can be found in the Care Plan section)  Acute Rehab PT Goals Patient Stated Goal: Go home today PT Goal Formulation: With patient Time For Goal Achievement: 02/10/23 Potential to Achieve Goals: Fair    Frequency Min 2X/week     Co-evaluation                AM-PAC PT "6 Clicks" Mobility  Outcome Measure Help needed turning from your back to your side while in a flat bed without using bedrails?: None Help needed moving from lying on your back to sitting on the side of a flat bed without using bedrails?: None Help needed moving to and from a bed to a chair (including a wheelchair)?: A Little Help needed standing up from a chair using your arms (e.g., wheelchair or bedside chair)?: A Little Help needed to walk in hospital room?: A Little Help needed climbing 3-5 steps with a railing? : A Lot 6 Click Score: 19    End of Session Equipment Utilized During Treatment: Gait belt;Oxygen Activity Tolerance: Patient limited by fatigue Patient left: in chair;with call bell/phone within reach Nurse Communication: Mobility status PT Visit Diagnosis: Muscle weakness (generalized) (M62.81);Difficulty in walking, not elsewhere classified (R26.2);Other abnormalities of gait and mobility (R26.89);History of falling (Z91.81);Unsteadiness on feet (R26.81);Dizziness and giddiness (R42)    Time: [id number] PT Time Calculation (min) (ACUTE ONLY): 22 min   Charges:   PT Evaluation $PT Eval Moderate Complexity: 1 Mod PT Treatments $Gait Training: 8-22 mins        [name], PT, GCS 02/03/23,1:29 PM

## 2023-02-04 LAB — BASIC METABOLIC PANEL
Anion gap: 5 (ref 5–15)
BUN: 20 mg/dL (ref 8–23)
CO2: 35 mmol/L — ABNORMAL HIGH (ref 22–32)
Calcium: 8.3 mg/dL — ABNORMAL LOW (ref 8.9–10.3)
Chloride: 97 mmol/L — ABNORMAL LOW (ref 98–111)
Creatinine, Ser: 0.73 mg/dL (ref 0.44–1.00)
GFR, Estimated: >60 mL/min (ref >60)
Glucose, Bld: 98 mg/dL (ref 70–99)
Potassium: 3.8 mmol/L (ref 3.5–5.1)
Sodium: 137 mmol/L (ref 135–145)

## 2023-02-04 LAB — MAGNESIUM: Magnesium: 1.8 mg/dL (ref 1.7–2.4)

## 2023-02-04 LAB — CBC
HCT: 32.1 % — ABNORMAL LOW (ref 36.0–46.0)
Hemoglobin: 9.8 g/dL — ABNORMAL LOW (ref 12.0–15.0)
MCH: 26.8 pg (ref 26.0–34.0)
MCHC: 30.5 g/dL (ref 30.0–36.0)
MCV: 87.7 fL (ref 80.0–100.0)
Platelets: 279 10*3/uL (ref 150–400)
RBC: 3.66 MIL/uL — ABNORMAL LOW (ref 3.87–5.11)
RDW: 15 % (ref 11.5–15.5)
WBC: 7.8 10*3/uL (ref 4.0–10.5)
nRBC: 0 % (ref 0.0–0.2)

## 2023-02-04 LAB — CULTURE, BLOOD (ROUTINE X 2)
Culture: NO GROWTH
Culture: NO GROWTH

## 2023-02-04 MED ORDER — PREDNISONE 20 MG PO TABS
20.0000 mg | ORAL_TABLET | Freq: Every day | ORAL | Status: DC
  Administered 2023-02-04: 20 mg via ORAL
  Filled 2023-02-04: qty 1

## 2023-02-04 MED ORDER — PREDNISONE 10 MG PO TABS: 10.0000 mg | ORAL_TABLET | Freq: Every day | ORAL | 0 refills | Status: AC

## 2023-02-04 MED ORDER — FUROSEMIDE 40 MG PO TABS: 40.0000 mg | ORAL_TABLET | Freq: Every day | ORAL | 0 refills | Status: DC

## 2023-02-04 NOTE — TOC Transition Note (Signed)
Transition of Care [facility]) - CM/SW Discharge Note   Patient Details  Name: [name] MRN: [id number] Date of Birth: December 16, 1945  Transition of Care [facility]) CM/SW Contact:  Rebekah [place], LCSW Phone Number: 02/04/2023, 9:52 AM   Clinical Narrative:    CSW met with patient at bedside, no supports in room. Informed her that PT evaluation recommended HH OT/PT. Patient was agreeable to services through [facility]. She is not interested in palliative or hospice services at this time.  CSW spoke with pt's grandson via phone. Confirmed that there are no oxygen needs noting he will call [facility] directly, should any arise. He will bring a portable tank at discharge and provide pt transportation home. Informed him [facility] will provide HH OT/PT.   CSW informed Anise Har[place], with [facility] of pt discharge.    Final next level of care: [place] Barriers to Discharge: Barriers Resolved   Patient Goals and CMS Choice      Discharge Placement                         Discharge Plan and Services Additional resources added to the After Visit Summary for       Post Acute Care Choice: NA                    HH Arranged: PT, OT HH Agency: [place] Date [facility] Agency Contacted: 02/04/23 Time [place]: [id number] Representative spoke with at [place]: [place] Determinants of Health ([place]) Interventions SDOH Screenings   Food Insecurity: No Food Insecurity (02/02/2023)  Housing: [place]  (02/02/2023)  Transportation Needs: No Transportation Needs (02/02/2023)  Utilities: Not At Risk (02/02/2023)  Depression (PHQ2-9): Low Risk  (07/13/2021)  Tobacco Use: High Risk (02/02/2023)     Readmission Risk Interventions    01/31/2023   11:49 AM 07/05/2022   10:50 AM 11/25/2021    9:50 AM  Readmission Risk Prevention Plan  Transportation Screening Complete Complete Complete  Medication Review Press photographer) Complete Complete Complete  PCP or  Specialist appointment within 3-5 days of discharge Complete  Complete  HRI or [place]  Complete Complete  SW Recovery Care/Counseling Consult Complete Complete   Palliative Care Screening Not Applicable  Not [place] Not Applicable Not Applicable Not Applicable

## 2023-02-04 NOTE — Discharge Summary (Signed)
Physician Discharge Summary   Kevona Bush  female DOB: October 10, 1946  [id number]  PCP: Albina Billet, MD  Admit date: 01/30/2023 Discharge date: 02/04/2023  Admitted From: home Disposition:  home [place] updated on the phone prior to discharge. Home Health: Yes CODE STATUS: DNR  Discharge Instructions     Ambulatory referral to Gastroenterology   Complete by: As directed    What is the reason for referral?: Other Comment - esophageal dysmotility   Diet - low sodium heart healthy   Complete by: As directed    Discharge instructions   Complete by: As directed    You have severe COPD with flare up.  You have received IV antibiotics and steroid.  I have prescribed you prednisone 10 mg daily for 14 days.  Please see your lung doctor Dr. Lanney Gins in 1-2 weeks to determine whether you need to be on steroid for long term to help with your breathing.  You also received IV lasix to remove extra fluid in your lungs.  I have prescribed you oral Lasix 40 mg daily for 7 days.  Also follow up with Dr. Lanney Gins to check labs and see if you need to continue fluid pills.  I have prescribed you a short course of Percocet for a relative new compression fracture in your spine.  After our discussion, you have decided to be DNR.   Dr. Enzo Bi [facility] Course:  For full details, please see H&P, progress notes, consult notes and ancillary notes.  Briefly,  Daileen Rines is a 78 y.o. female with medical history significant of COPD (on 2-3L oxygen), hypertension, PVD, CAD, hypothyroidism, depression with anxiety, former smoker, medication noncompliance, who presented with shortness of breath.    Acute respiratory distress Chronic respiratory failure on 2-3L O2 at baseline CTA negative for PE.  Has COPD exacerbation.  Images showed patchy infiltration, cannot completely rule out the possibility of mild pneumonia.  CXR also showed mild pulm edema. --treated underlying  causes   COPD exacerbation [facility]):  Patient has aroductive cough, wheezing, worsening shortness of breath, increased oxygen requirement, clinically consistent with COPD exacerbation. --received IV solumedrol 125 mg x1 in the ED f/b prednisone 40 mg daily --received 5 days of ceftriaxone and doxy.   --due to severity of pt's COPD and dyspnea, pt was discharged on prednisone 10 mg daily for 14 days, and advised to f/u with pulm Dr. Lanney Gins in 1-2 weeks to determine need for long-term steroid.   Acute Pulm edema --received IV lasix 40 mg daily during hospitalization with good urine output.  Pt was discharged on oral lasix 40 mg daily for 7 days.   CAD (coronary artery disease): -ASA, Lipitor,   Essential hypertension: -Amlodipine, metoprolol  Hypothyroidism: Continue Synthroid   Compression fracture of body of thoracic vertebra (HCC) --pt received and discharged on a short course of Percocet.   Anxiety and depression: --cont home alprazolam PRN   Protein-calorie malnutrition, severe: Body weight 45.4 kg, BMI 17.17 --supplements per dietician    Hypokalemia Hypomag --2/2 diuresis --monitored and repleted PRN   Esophageal dysmotility Severe GERD --Pt reported severe GERD symptoms.  DG esophagus attempted on 3/15, very limited study due to pt intolerance, but noted dysmotility/esophageal spasms, no stricture seen. --outpatient GI f/u   Discharge Diagnoses:  Principal Problem:   COPD exacerbation ([place]) Active Problems:   Acute on chronic respiratory failure with hypoxia (HCC)   CAD (coronary artery disease)  Essential hypertension   Normocytic anemia   Hypothyroidism   Compression fracture of body of thoracic vertebra (HCC)   Anxiety and depression   Protein-calorie malnutrition, severe   30 Day Unplanned Readmission Risk Score    Flowsheet Row ED to Hosp-Admission (Current) from 01/30/2023 in [place] (1A)  30 Day Unplanned  Readmission Risk Score (%) 37.53 Filed at 02/04/2023 0801       This score is the patient's risk of an unplanned readmission within 30 days of being discharged (0 -100%). The score is based on dignosis, age, lab data, medications, orders, and past utilization.   Low:  0-14.9   Medium: 15-21.9   High: 22-29.9   Extreme: 30 and above         Discharge Instructions:  Allergies as of 02/04/2023   No Known Allergies      Medication List     STOP taking these medications    azithromycin 250 MG tablet Commonly known as: ZITHROMAX       TAKE these medications    albuterol 108 (90 Base) MCG/ACT inhaler Commonly known as: VENTOLIN HFA Inhale 1-2 puffs into the lungs every 4 (four) hours as needed for shortness of breath or wheezing.   ALPRAZolam 1 MG tablet Commonly known as: XANAX Take 1 mg by mouth 3 (three) times daily as needed for anxiety.   amLODipine 5 MG tablet Commonly known as: NORVASC Take 1 tablet (5 mg total) by mouth daily.   ammonium lactate 12 % lotion Commonly known as: LAC-HYDRIN APPLY TO AFFECTED AREA AS NEEDED FOR DRY SKIN   Aspirin Low Dose 81 MG tablet Generic drug: aspirin EC Take 81 mg by mouth daily.   atorvastatin 10 MG tablet Commonly known as: Lipitor Take 1 tablet (10 mg total) by mouth daily.   Combivent Respimat 20-100 MCG/ACT Aers respimat Generic drug: Ipratropium-Albuterol Inhale 1-2 puffs into the lungs 4 (four) times daily.   Eliquis 5 MG Tabs tablet Generic drug: apixaban Take 5 mg by mouth 2 (two) times daily.   feeding supplement Liqd Take 237 mLs by mouth 3 (three) times daily between meals.   fluticasone-salmeterol 250-50 MCG/ACT Aepb Commonly known as: ADVAIR Inhale 1 puff into the lungs 2 (two) times daily as needed.   furosemide 40 MG tablet Commonly known as: LASIX Take 1 tablet (40 mg total) by mouth daily for 7 days. Start taking on: February 05, 2023   levothyroxine 50 MCG tablet Commonly known as:  SYNTHROID Take 50 mcg by mouth every morning.   metoprolol tartrate 50 MG tablet Commonly known as: LOPRESSOR Take 1 tablet (50 mg total) by mouth 2 (two) times daily.   montelukast 10 MG tablet Commonly known as: SINGULAIR Take 10 mg by mouth at bedtime.   multivitamin with minerals Tabs tablet Take 1 tablet by mouth daily.   omeprazole 40 MG capsule Commonly known as: PRILOSEC Take 40 mg by mouth daily.   oxyCODONE-acetaminophen 5-325 MG tablet Commonly known as: PERCOCET/ROXICET Take 1 tablet by mouth every 6 (six) hours as needed for up to 3 days for moderate pain.   predniSONE 10 MG tablet Commonly known as: DELTASONE Take 1 tablet (10 mg total) by mouth daily with breakfast for 14 days. Start taking on: February 05, 2023   thiamine 100 MG tablet Commonly known as: Vitamin B-1 Take 1 tablet (100 mg total) by mouth daily.   traZODone 150 MG tablet Commonly known as: DESYREL Take 300 mg by mouth at  bedtime.         Follow-up Information     Lesly Rubenstein, MD Follow up in 1 month(s).   Specialty: Gastroenterology Why: Esophageal dysmotility Contact information: [place] [address] [phone]         Ottie Glazier, MD Follow up in 1 week(s).   Specialty: Pulmonary Disease Contact information: [place] [address] [phone]                 No Known Allergies   The results of significant diagnostics from this hospitalization (including imaging, microbiology, ancillary and laboratory) are listed below for reference.   Consultations:   Procedures/Studies: DG ESOPHAGUS W DOUBLE CM (HD)  Result Date: 02/02/2023 CLINICAL DATA:  Patient with complaints of GERD, regurgitation and the feeling of food stuck in her throat. EXAM: ESOPHAGUS/BARIUM SWALLOW/TABLET STUDY TECHNIQUE: Single contrast examination was performed using thin liquid barium. This exam was performed by Soyla Dryer, NP, and  was supervised and interpreted by Dr. Shearon Stalls. FLUOROSCOPY: Radiation Exposure Index (as provided by the fluoroscopic device): 2.20 mGy Kerma COMPARISON:  None Available. FINDINGS: Swallowing: Appears normal. No vestibular penetration or aspiration seen. Pharynx: Unremarkable. Esophagus: Normal appearance.  No stricture observed. Esophageal motility: Dysmotility with tertiary contractions. Hiatal Hernia: Small hiatal hernia observed Gastroesophageal reflux: None visualized. Ingested 85mm barium tablet: Not given Other: None. IMPRESSION: Limited study due to patient's inability to participate in study. Dysmotility with tertiary contractions observed. Small hiatal hernia observed. Performed by: Soyla Dryer, NP Electronically Signed   By: Marin Roberts M.D.   On: 02/02/2023 16:53   CT Angio Chest PE W and/or Wo Contrast  Result Date: 01/30/2023 CLINICAL DATA:  Pulmonary embolism suspected, high probability. Shortness of breath. COPD and atrial fibrillation. EXAM: CT ANGIOGRAPHY CHEST WITH CONTRAST TECHNIQUE: Multidetector CT imaging of the chest was performed using the standard protocol during bolus administration of intravenous contrast. Multiplanar CT image reconstructions and MIPs were obtained to evaluate the vascular anatomy. RADIATION DOSE REDUCTION: This exam was performed according to the departmental dose-optimization program which includes automated exposure control, adjustment of the mA and/or kV according to patient size and/or use of iterative reconstruction technique. CONTRAST:  35mL OMNIPAQUE IOHEXOL 350 MG/ML SOLN COMPARISON:  Chest radiography same day.  CT 01/13/2021. FINDINGS: Cardiovascular: Heart size is normal. Coronary artery calcification and aortic atherosclerotic calcification are present. Pulmonary arterial opacification is good. There are no pulmonary emboli. Mediastinum/Nodes: Redemonstration of mild mediastinal and right hilar lymphadenopathy, similar to the study of February 2022.  Right hilar lymph node measures up to 2.2 cm. Infrahilar lymph node on the right measures up to 17 mm. No new or progressive finding. Lungs/Pleura: Background pattern of widespread emphysema. Bronchial thickening in the lower lobes. Patchy infiltrates/atelectasis at the right lung base, similar to the study of 2022, consistent with bronchopneumonia. Mild chronic patchy density at the left lung base appears similar as well. Upper Abdomen: No acute upper abdominal finding. Previous cholecystectomy. Musculoskeletal: Multiple old minor compression fractures in the thoracic spine. Since the study of 2022, there is a newly seen fracture at T7 which could be acute/subacute. Minor superior endplate fracture at 624THL, though newly seen since February of 2022, looks old and healed. Review of the MIP images confirms the above findings. IMPRESSION: 1. No pulmonary emboli. 2. Background pattern of widespread emphysema. 3. Bronchial thickening in the lower lobes. Patchy infiltrates/atelectasis at the right lung base, similar to the study of February 2022. This could be clinically  relevant basilar bronchopneumonia. Mild patchy density at the left lung base appears similar as well. 4. Redemonstration of mild mediastinal and right hilar lymphadenopathy, similar to the study of February 2022. This is presumed reactive to the right lower lobe inflammatory disease. 5. Coronary artery calcification and aortic atherosclerotic calcification. 45. Multiple old minor compression fractures in the thoracic spine. Since the study of 2022, there is a newly seen fracture at T7 which could be acute/subacute. Minor superior endplate fracture at 624THL, though newly seen since February of 2022, looks old and healed. 7. Aortic atherosclerosis. Aortic Atherosclerosis (ICD10-I70.0) and Emphysema (ICD10-J43.9). Electronically Signed   By: Nelson Chimes M.D.   On: 01/30/2023 11:55   DG Chest Portable 1 View  Result Date: 01/30/2023 CLINICAL DATA:  Shortness  of breath. History of COPD and atrial fibrillation. Cough and wheezing. EXAM: PORTABLE CHEST 1 VIEW COMPARISON:  01/11/23 FINDINGS: Heart size appears normal. Central airway thickening noted. Mild diffuse increase interstitial markings compatible with pulmonary edema. Patchy airspace opacities within both lung bases may represent areas of airspace disease or atelectasis. No acute osseous findings. IMPRESSION: 1. Mild pulmonary edema. 2. Patchy airspace opacities within both lung bases may represent areas of airspace disease or atelectasis. 3. Central airway thickening compatible with the history of COPD. Electronically Signed   By: Kerby Moors M.D.   On: 01/30/2023 07:30   DG Chest Port 1 View  Result Date: 01/11/2023 CLINICAL DATA:  sob EXAM: PORTABLE CHEST 1 VIEW COMPARISON:  Chest x-ray 11/08/2022, CT chest 01/13/2021 FINDINGS: The heart and mediastinal contours are unchanged. Aortic calcification. No focal consolidation. Chronic coarsened markings with no overt pulmonary edema. No pleural effusion. No pneumothorax. No acute osseous abnormality. IMPRESSION: 1. No active disease. 2.  Aortic Atherosclerosis (ICD10-I70.0). Electronically Signed   By: Iven Finn M.D.   On: 01/11/2023 00:17      Labs: BNP (last 3 results) Recent Labs    06/29/22 2118 01/10/23 2314 01/30/23 0740  BNP 30.9 70.6 [id number]   Basic Metabolic Panel: Recent Labs  Lab 01/31/23 0647 02/01/23 0501 02/02/23 0649 02/03/23 0437 02/04/23 0513  NA 139 140 143 140 137  K 3.6 2.9* 3.5 3.0* 3.8  CL 108 103 101 97* 97*  CO2 25 29 32 34* 35*  GLUCOSE 90 88 94 88 98  BUN 12 14 15 13 20   CREATININE 0.52 0.50 0.50 0.65 0.73  CALCIUM 8.2* 7.9* 8.4* 8.3* 8.3*  MG  --  1.5* 2.0 1.5* 1.8   Liver Function Tests: Recent Labs  Lab 01/30/23 0740  AST 13*  ALT 11  ALKPHOS 79  BILITOT 0.5  PROT 6.8  ALBUMIN 3.2*   No results for input(s): "LIPASE", "AMYLASE" in the last 168 hours. No results for input(s): "AMMONIA"  in the last 168 hours. CBC: Recent Labs  Lab 01/30/23 0740 01/31/23 0647 02/01/23 0501 02/02/23 0649 02/03/23 0437 02/04/23 0513  WBC 7.7 6.2 10.7* 5.6 6.9 7.8  NEUTROABS 6.6  --   --   --   --   --   HGB 10.7* 9.4* 9.0* 9.2* 9.7* 9.8*  HCT 37.1 31.0* 29.1* 29.9* 30.7* 32.1*  MCV 96.1 92.0 89.8 89.8 88.7 87.7  PLT 266 218 239 225 242 279   Cardiac Enzymes: No results for input(s): "CKTOTAL", "CKMB", "CKMBINDEX", "TROPONINI" in the last 168 hours. BNP: Invalid input(s): "POCBNP" CBG: No results for input(s): "GLUCAP" in the last 168 hours. D-Dimer No results for input(s): "DDIMER" in the last 72 hours. Hgb A1c No  results for input(s): "HGBA1C" in the last 72 hours. Lipid Profile No results for input(s): "CHOL", "HDL", "LDLCALC", "TRIG", "CHOLHDL", "LDLDIRECT" in the last 72 hours. Thyroid function studies No results for input(s): "TSH", "T4TOTAL", "T3FREE", "THYROIDAB" in the last 72 hours.  Invalid input(s): "FREET3" Anemia work up No results for input(s): "VITAMINB12", "FOLATE", "FERRITIN", "TIBC", "IRON", "RETICCTPCT" in the last 72 hours. Urinalysis    Component Value Date/Time   COLORURINE YELLOW (A) 11/08/2022 1252   APPEARANCEUR HAZY (A) 11/08/2022 1252   LABSPEC 1.021 11/08/2022 1252   PHURINE 6.0 11/08/2022 1252   GLUCOSEU NEGATIVE 11/08/2022 1252   HGBUR SMALL (A) 11/08/2022 1252   BILIRUBINUR NEGATIVE 11/08/2022 1252   KETONESUR 5 (A) 11/08/2022 1252   PROTEINUR 30 (A) 11/08/2022 1252   NITRITE NEGATIVE 11/08/2022 1252   LEUKOCYTESUR SMALL (A) 11/08/2022 1252   Sepsis Labs Recent Labs  Lab 02/01/23 0501 02/02/23 0649 02/03/23 0437 02/04/23 0513  WBC 10.7* 5.6 6.9 7.8   Microbiology Recent Results (from the past 240 hour(s))  Resp panel by RT-PCR (RSV, Flu A&B, Covid) Anterior Nasal Swab     Status: None   Collection Time: 01/30/23  7:40 AM   Specimen: Anterior Nasal Swab  Result Value Ref Range Status   SARS Coronavirus 2 by RT PCR NEGATIVE  NEGATIVE Final    Comment: (NOTE) SARS-CoV-2 target nucleic acids are NOT DETECTED.  The SARS-CoV-2 RNA is generally detectable in upper respiratory specimens during the acute phase of infection. The lowest concentration of SARS-CoV-2 viral copies this assay can detect is 138 copies/mL. A negative result does not preclude SARS-Cov-2 infection and should not be used as the sole basis for treatment or other patient management decisions. A negative result may occur with  improper specimen collection/handling, submission of specimen other than nasopharyngeal swab, presence of viral mutation(s) within the areas targeted by this assay, and inadequate number of viral copies(<138 copies/mL). A negative result must be combined with clinical observations, patient history, and epidemiological information. The expected result is Negative.  Fact Sheet for Patients:  EntrepreneurPulse.com.au  Fact Sheet for Healthcare Providers:  IncredibleEmployment.be  This test is no t yet approved or cleared by the [place] FDA and  has been authorized for detection and/or diagnosis of SARS-CoV-2 by FDA under an Emergency Use Authorization (EUA). This EUA will remain  in effect (meaning this test can be used) for the duration of the COVID-19 declaration under Section 564(b)(1) of the Act, 21 U.S.C.section 360bbb-3(b)(1), unless the authorization is terminated  or revoked sooner.       Influenza A by PCR NEGATIVE NEGATIVE Final   Influenza B by PCR NEGATIVE NEGATIVE Final    Comment: (NOTE) The Xpert Xpress SARS-CoV-2/FLU/RSV plus assay is intended as an aid in the diagnosis of influenza from Nasopharyngeal swab specimens and should not be used as a sole basis for treatment. Nasal washings and aspirates are unacceptable for Xpert Xpress SARS-CoV-2/FLU/RSV testing.  Fact Sheet for Patients: EntrepreneurPulse.com.au  Fact Sheet for Healthcare  Providers: IncredibleEmployment.be  This test is not yet approved or cleared by the [place] FDA and has been authorized for detection and/or diagnosis of SARS-CoV-2 by FDA under an Emergency Use Authorization (EUA). This EUA will remain in effect (meaning this test can be used) for the duration of the COVID-19 declaration under Section 564(b)(1) of the Act, 21 U.S.C. section 360bbb-3(b)(1), unless the authorization is terminated or revoked.     Resp Syncytial Virus by PCR NEGATIVE NEGATIVE Final    Comment: (  NOTE) Fact Sheet for Patients: EntrepreneurPulse.com.au  Fact Sheet for Healthcare Providers: IncredibleEmployment.be  This test is not yet approved or cleared by the [place] FDA and has been authorized for detection and/or diagnosis of SARS-CoV-2 by FDA under an Emergency Use Authorization (EUA). This EUA will remain in effect (meaning this test can be used) for the duration of the COVID-19 declaration under Section 564(b)(1) of the Act, 21 U.S.C. section 360bbb-3(b)(1), unless the authorization is terminated or revoked.  Performed at [facility], [address] [place], [place] [address]   Culture, blood (routine x 2) Call MD if unable to obtain prior to antibiotics being given     Status: None   Collection Time: 01/30/23 12:16 PM   Specimen: BLOOD  Result Value Ref Range Status   Specimen Description BLOOD BLOOD LEFT WRIST  Final   Special Requests   Final    BOTTLES DRAWN AEROBIC AND ANAEROBIC Blood Culture results may not be optimal due to an inadequate volume of blood received in culture bottles   Culture   Final    NO GROWTH 5 DAYS Performed at [facility], [address] [place], [place] [address]    Report Status 02/04/2023 FINAL  Final  Culture, blood (routine x 2) Call MD if unable to obtain prior to antibiotics being given     Status: None   Collection Time: 01/30/23  12:16 PM   Specimen: BLOOD  Result Value Ref Range Status   Specimen Description BLOOD BLOOD RIGHT FOREARM  Final   Special Requests   Final    BOTTLES DRAWN AEROBIC AND ANAEROBIC Blood Culture results may not be optimal due to an inadequate volume of blood received in culture bottles   Culture   Final    NO GROWTH 5 DAYS Performed at [facility], [address] [place], [place] [address]    Report Status 02/04/2023 FINAL  Final  Respiratory (~20 pathogens) panel by PCR     Status: None   Collection Time: 01/31/23  5:57 PM   Specimen: Nasopharyngeal Swab; Respiratory  Result Value Ref Range Status   Adenovirus NOT DETECTED NOT DETECTED Final   Coronavirus 229E NOT DETECTED NOT DETECTED Final    Comment: (NOTE) The Coronavirus on the Respiratory Panel, DOES NOT test for the novel  Coronavirus (2019 nCoV)    Coronavirus HKU1 NOT DETECTED NOT DETECTED Final   Coronavirus NL63 NOT DETECTED NOT DETECTED Final   Coronavirus OC43 NOT DETECTED NOT DETECTED Final   Metapneumovirus NOT DETECTED NOT DETECTED Final   Rhinovirus / Enterovirus NOT DETECTED NOT DETECTED Final   Influenza A NOT DETECTED NOT DETECTED Final   Influenza B NOT DETECTED NOT DETECTED Final   Parainfluenza Virus 1 NOT DETECTED NOT DETECTED Final   Parainfluenza Virus 2 NOT DETECTED NOT DETECTED Final   Parainfluenza Virus 3 NOT DETECTED NOT DETECTED Final   Parainfluenza Virus 4 NOT DETECTED NOT DETECTED Final   Respiratory Syncytial Virus NOT DETECTED NOT DETECTED Final   Bordetella pertussis NOT DETECTED NOT DETECTED Final   Bordetella Parapertussis NOT DETECTED NOT DETECTED Final   Chlamydophila pneumoniae NOT DETECTED NOT DETECTED Final   Mycoplasma pneumoniae NOT DETECTED NOT DETECTED Final    Comment: Performed at [facility], [place] [address], [place], [place] [address]     Total time spend on discharging this patient, including the last patient exam, discussing the hospital stay,  instructions for ongoing care as it relates to all pertinent caregivers,  as well as preparing the medical discharge records, prescriptions, and/or referrals as applicable, is 45 minutes.    Enzo Bi, MD  [facility] 02/04/2023, 8:50 AM

## 2023-02-24 ENCOUNTER — Inpatient Hospital Stay
Admission: EM | Admit: 2023-02-24 | Discharge: 2023-03-02 | DRG: 190 | Disposition: A | Discharge status: Home Health | Payer: Medicare HMO | Attending: Student | Admitting: Student

## 2023-02-24 ENCOUNTER — Encounter: Payer: Self-pay | Admitting: Emergency Medicine

## 2023-02-24 ENCOUNTER — Emergency Department: Payer: Medicare HMO

## 2023-02-24 ENCOUNTER — Other Ambulatory Visit: Payer: Self-pay

## 2023-02-24 DIAGNOSIS — G9341 Metabolic encephalopathy: Secondary | ICD-10-CM | POA: Diagnosis present

## 2023-02-24 DIAGNOSIS — D509 Iron deficiency anemia, unspecified: Secondary | ICD-10-CM | POA: Diagnosis present

## 2023-02-24 DIAGNOSIS — Z79890 Hormone replacement therapy (postmenopausal): Secondary | ICD-10-CM

## 2023-02-24 DIAGNOSIS — Z9981 Dependence on supplemental oxygen: Secondary | ICD-10-CM | POA: Diagnosis not present

## 2023-02-24 DIAGNOSIS — Z7982 Long term (current) use of aspirin: Secondary | ICD-10-CM

## 2023-02-24 DIAGNOSIS — F17200 Nicotine dependence, unspecified, uncomplicated: Secondary | ICD-10-CM

## 2023-02-24 DIAGNOSIS — I5A Non-ischemic myocardial injury (non-traumatic): Secondary | ICD-10-CM

## 2023-02-24 DIAGNOSIS — R410 Disorientation, unspecified: Secondary | ICD-10-CM | POA: Diagnosis present

## 2023-02-24 DIAGNOSIS — Z7951 Long term (current) use of inhaled steroids: Secondary | ICD-10-CM

## 2023-02-24 DIAGNOSIS — E876 Hypokalemia: Secondary | ICD-10-CM | POA: Diagnosis present

## 2023-02-24 DIAGNOSIS — F419 Anxiety disorder, unspecified: Secondary | ICD-10-CM | POA: Diagnosis present

## 2023-02-24 DIAGNOSIS — R4182 Altered mental status, unspecified: Secondary | ICD-10-CM

## 2023-02-24 DIAGNOSIS — Z681 Body mass index (BMI) 19 or less, adult: Secondary | ICD-10-CM

## 2023-02-24 DIAGNOSIS — E785 Hyperlipidemia, unspecified: Secondary | ICD-10-CM | POA: Diagnosis present

## 2023-02-24 DIAGNOSIS — Z1152 Encounter for screening for COVID-19: Secondary | ICD-10-CM

## 2023-02-24 DIAGNOSIS — Z79899 Other long term (current) drug therapy: Secondary | ICD-10-CM | POA: Diagnosis not present

## 2023-02-24 DIAGNOSIS — E860 Dehydration: Secondary | ICD-10-CM | POA: Diagnosis present

## 2023-02-24 DIAGNOSIS — Z7901 Long term (current) use of anticoagulants: Secondary | ICD-10-CM

## 2023-02-24 DIAGNOSIS — I10 Essential (primary) hypertension: Secondary | ICD-10-CM

## 2023-02-24 DIAGNOSIS — Z811 Family history of alcohol abuse and dependence: Secondary | ICD-10-CM

## 2023-02-24 DIAGNOSIS — J9611 Chronic respiratory failure with hypoxia: Secondary | ICD-10-CM | POA: Diagnosis present

## 2023-02-24 DIAGNOSIS — Z89421 Acquired absence of other right toe(s): Secondary | ICD-10-CM

## 2023-02-24 DIAGNOSIS — Z91148 Patient's other noncompliance with medication regimen for other reason: Secondary | ICD-10-CM

## 2023-02-24 DIAGNOSIS — K219 Gastro-esophageal reflux disease without esophagitis: Secondary | ICD-10-CM | POA: Diagnosis present

## 2023-02-24 DIAGNOSIS — R008 Other abnormalities of heart beat: Secondary | ICD-10-CM | POA: Diagnosis not present

## 2023-02-24 DIAGNOSIS — I2510 Atherosclerotic heart disease of native coronary artery without angina pectoris: Secondary | ICD-10-CM | POA: Diagnosis present

## 2023-02-24 DIAGNOSIS — E43 Unspecified severe protein-calorie malnutrition: Secondary | ICD-10-CM

## 2023-02-24 DIAGNOSIS — F4321 Adjustment disorder with depressed mood: Secondary | ICD-10-CM | POA: Diagnosis present

## 2023-02-24 DIAGNOSIS — G4700 Insomnia, unspecified: Secondary | ICD-10-CM | POA: Diagnosis present

## 2023-02-24 DIAGNOSIS — F32A Depression, unspecified: Secondary | ICD-10-CM

## 2023-02-24 DIAGNOSIS — Z981 Arthrodesis status: Secondary | ICD-10-CM

## 2023-02-24 DIAGNOSIS — F17210 Nicotine dependence, cigarettes, uncomplicated: Secondary | ICD-10-CM | POA: Diagnosis present

## 2023-02-24 DIAGNOSIS — Z66 Do not resuscitate: Secondary | ICD-10-CM | POA: Diagnosis present

## 2023-02-24 DIAGNOSIS — I739 Peripheral vascular disease, unspecified: Secondary | ICD-10-CM | POA: Diagnosis present

## 2023-02-24 DIAGNOSIS — E039 Hypothyroidism, unspecified: Secondary | ICD-10-CM | POA: Diagnosis present

## 2023-02-24 DIAGNOSIS — J441 Chronic obstructive pulmonary disease with (acute) exacerbation: Principal | ICD-10-CM

## 2023-02-24 DIAGNOSIS — H9190 Unspecified hearing loss, unspecified ear: Secondary | ICD-10-CM | POA: Diagnosis present

## 2023-02-24 DIAGNOSIS — R000 Tachycardia, unspecified: Secondary | ICD-10-CM | POA: Diagnosis present

## 2023-02-24 DIAGNOSIS — K224 Dyskinesia of esophagus: Secondary | ICD-10-CM | POA: Diagnosis present

## 2023-02-24 LAB — CBC WITH DIFFERENTIAL/PLATELET
Abs Immature Granulocytes: 0.04 10*3/uL (ref 0.00–0.07)
Basophils Absolute: 0 10*3/uL (ref 0.0–0.1)
Basophils Relative: 0 %
Eosinophils Absolute: 0 10*3/uL (ref 0.0–0.5)
Eosinophils Relative: 0 %
HCT: 34.8 % — ABNORMAL LOW (ref 36.0–46.0)
Hemoglobin: 10.4 g/dL — ABNORMAL LOW (ref 12.0–15.0)
Immature Granulocytes: 0 %
Lymphocytes Relative: 4 %
Lymphs Abs: 0.4 10*3/uL — ABNORMAL LOW (ref 0.7–4.0)
MCH: 26.9 pg (ref 26.0–34.0)
MCHC: 29.9 g/dL — ABNORMAL LOW (ref 30.0–36.0)
MCV: 90.2 fL (ref 80.0–100.0)
Monocytes Absolute: 0.2 10*3/uL (ref 0.1–1.0)
Monocytes Relative: 3 %
Neutro Abs: 8.8 10*3/uL — ABNORMAL HIGH (ref 1.7–7.7)
Neutrophils Relative %: 93 %
Platelets: 298 10*3/uL (ref 150–400)
RBC: 3.86 MIL/uL — ABNORMAL LOW (ref 3.87–5.11)
RDW: 15.5 % (ref 11.5–15.5)
WBC: 9.4 10*3/uL (ref 4.0–10.5)
nRBC: 0 % (ref 0.0–0.2)

## 2023-02-24 LAB — COMPREHENSIVE METABOLIC PANEL
ALT: 17 U/L (ref 0–44)
AST: 21 U/L (ref 15–41)
Albumin: 3.7 g/dL (ref 3.5–5.0)
Alkaline Phosphatase: 83 U/L (ref 38–126)
Anion gap: 19 — ABNORMAL HIGH (ref 5–15)
BUN: 20 mg/dL (ref 8–23)
CO2: 21 mmol/L — ABNORMAL LOW (ref 22–32)
Calcium: 8.9 mg/dL (ref 8.9–10.3)
Chloride: 102 mmol/L (ref 98–111)
Creatinine, Ser: 0.8 mg/dL (ref 0.44–1.00)
GFR, Estimated: >60 mL/min (ref >60)
Glucose, Bld: 90 mg/dL (ref 70–99)
Potassium: 4.1 mmol/L (ref 3.5–5.1)
Sodium: 142 mmol/L (ref 135–145)
Total Bilirubin: 1.7 mg/dL — ABNORMAL HIGH (ref 0.3–1.2)
Total Protein: 7.1 g/dL (ref 6.5–8.1)

## 2023-02-24 LAB — URINE DRUG SCREEN, QUALITATIVE (ARMC ONLY)
Amphetamines, Ur Screen: NOT DETECTED
Barbiturates, Ur Screen: NOT DETECTED
Benzodiazepine, Ur Scrn: POSITIVE — AB
Cannabinoid 50 Ng, Ur ~~LOC~~: NOT DETECTED
Cocaine Metabolite,Ur ~~LOC~~: NOT DETECTED
MDMA (Ecstasy)Ur Screen: NOT DETECTED
Methadone Scn, Ur: NOT DETECTED
Opiate, Ur Screen: NOT DETECTED
Phencyclidine (PCP) Ur S: NOT DETECTED
Tricyclic, Ur Screen: NOT DETECTED

## 2023-02-24 LAB — URINALYSIS, ROUTINE W REFLEX MICROSCOPIC
Bacteria, UA: NONE SEEN
Bilirubin Urine: NEGATIVE
Glucose, UA: NEGATIVE mg/dL
Hgb urine dipstick: NEGATIVE
Ketones, ur: 80 mg/dL — AB
Leukocytes,Ua: NEGATIVE
Nitrite: NEGATIVE
Protein, ur: 30 mg/dL — AB
Specific Gravity, Urine: 1.02 (ref 1.005–1.030)
pH: 5 (ref 5.0–8.0)

## 2023-02-24 LAB — TROPONIN I (HIGH SENSITIVITY)
Troponin I (High Sensitivity): 18 ng/L — ABNORMAL HIGH (ref <18)
Troponin I (High Sensitivity): 18 ng/L — ABNORMAL HIGH (ref <18)

## 2023-02-24 LAB — BLOOD GAS, VENOUS
Acid-base deficit: 6.6 mmol/L — ABNORMAL HIGH (ref 0.0–2.0)
Bicarbonate: 20.2 mmol/L (ref 20.0–28.0)
O2 Saturation: 51.7 %
Patient temperature: 37
pCO2, Ven: 44 mmHg (ref 44–60)
pH, Ven: 7.27 (ref 7.25–7.43)
pO2, Ven: 36 mmHg (ref 32–45)

## 2023-02-24 LAB — RESP PANEL BY RT-PCR (RSV, FLU A&B, COVID)  RVPGX2
Influenza A by PCR: NEGATIVE
Influenza B by PCR: NEGATIVE
Resp Syncytial Virus by PCR: NEGATIVE
SARS Coronavirus 2 by RT PCR: NEGATIVE

## 2023-02-24 LAB — PROCALCITONIN: Procalcitonin: <0.1 ng/mL

## 2023-02-24 LAB — BRAIN NATRIURETIC PEPTIDE: B Natriuretic Peptide: 51.2 pg/mL (ref 0.0–100.0)

## 2023-02-24 LAB — LACTIC ACID, PLASMA: Lactic Acid, Venous: 1.6 mmol/L (ref 0.5–1.9)

## 2023-02-24 MED ORDER — PANTOPRAZOLE SODIUM 40 MG PO TBEC
40.0000 mg | DELAYED_RELEASE_TABLET | Freq: Every day | ORAL | Status: DC
  Administered 2023-02-24 – 2023-03-02 (×7): 40 mg via ORAL
  Filled 2023-02-24 (×7): qty 1

## 2023-02-24 MED ORDER — LEVOTHYROXINE SODIUM 50 MCG PO TABS
50.0000 ug | ORAL_TABLET | Freq: Every morning | ORAL | Status: DC
  Administered 2023-02-25 – 2023-03-02 (×6): 50 ug via ORAL
  Filled 2023-02-24 (×6): qty 1

## 2023-02-24 MED ORDER — AMLODIPINE BESYLATE 5 MG PO TABS
5.0000 mg | ORAL_TABLET | Freq: Every day | ORAL | Status: DC
  Administered 2023-02-24 – 2023-03-02 (×7): 5 mg via ORAL
  Filled 2023-02-24 (×7): qty 1

## 2023-02-24 MED ORDER — HYDRALAZINE HCL 20 MG/ML IJ SOLN
5.0000 mg | INTRAMUSCULAR | Status: DC | PRN
  Administered 2023-02-24 – 2023-02-25 (×2): 5 mg via INTRAVENOUS
  Filled 2023-02-24 (×2): qty 1

## 2023-02-24 MED ORDER — DM-GUAIFENESIN ER 30-600 MG PO TB12: 1.0000 | ORAL_TABLET | Freq: Two times a day (BID) | ORAL | Status: DC | PRN

## 2023-02-24 MED ORDER — ACETAMINOPHEN 325 MG PO TABS
650.0000 mg | ORAL_TABLET | Freq: Four times a day (QID) | ORAL | Status: DC | PRN
  Administered 2023-02-24 – 2023-03-01 (×6): 650 mg via ORAL
  Filled 2023-02-24 (×6): qty 2

## 2023-02-24 MED ORDER — TRAZODONE HCL 100 MG PO TABS
300.0000 mg | ORAL_TABLET | Freq: Every day | ORAL | Status: DC
  Administered 2023-02-24 – 2023-03-01 (×6): 300 mg via ORAL
  Filled 2023-02-24 (×6): qty 3

## 2023-02-24 MED ORDER — METOPROLOL TARTRATE 50 MG PO TABS
50.0000 mg | ORAL_TABLET | Freq: Two times a day (BID) | ORAL | Status: DC
  Administered 2023-02-24 – 2023-03-02 (×13): 50 mg via ORAL
  Filled 2023-02-24 (×13): qty 1

## 2023-02-24 MED ORDER — ENOXAPARIN SODIUM 40 MG/0.4ML IJ SOSY
40.0000 mg | PREFILLED_SYRINGE | INTRAMUSCULAR | Status: DC
  Administered 2023-02-24 – 2023-03-01 (×6): 40 mg via SUBCUTANEOUS
  Filled 2023-02-24 (×6): qty 0.4

## 2023-02-24 MED ORDER — ATORVASTATIN CALCIUM 10 MG PO TABS
10.0000 mg | ORAL_TABLET | Freq: Every day | ORAL | Status: DC
  Administered 2023-02-24 – 2023-03-02 (×7): 10 mg via ORAL
  Filled 2023-02-24 (×7): qty 1

## 2023-02-24 MED ORDER — ASPIRIN 81 MG PO TBEC
81.0000 mg | DELAYED_RELEASE_TABLET | Freq: Every day | ORAL | Status: DC
  Administered 2023-02-24 – 2023-03-02 (×7): 81 mg via ORAL
  Filled 2023-02-24 (×7): qty 1

## 2023-02-24 MED ORDER — SODIUM CHLORIDE 0.9 % IV BOLUS
500.0000 mL | Freq: Once | INTRAVENOUS | Status: AC
  Administered 2023-02-24: 500 mL via INTRAVENOUS

## 2023-02-24 MED ORDER — ALBUTEROL SULFATE (2.5 MG/3ML) 0.083% IN NEBU: 2.5000 mg | INHALATION_SOLUTION | RESPIRATORY_TRACT | Status: DC | PRN

## 2023-02-24 MED ORDER — RISPERIDONE 1 MG PO TABS
0.5000 mg | ORAL_TABLET | Freq: Once | ORAL | Status: AC
  Administered 2023-02-24: 0.5 mg via ORAL
  Filled 2023-02-24: qty 1

## 2023-02-24 MED ORDER — METOPROLOL TARTRATE 5 MG/5ML IV SOLN
5.0000 mg | INTRAVENOUS | Status: DC | PRN
  Administered 2023-02-24: 5 mg via INTRAVENOUS
  Filled 2023-02-24: qty 5

## 2023-02-24 MED ORDER — AZITHROMYCIN 250 MG PO TABS
250.0000 mg | ORAL_TABLET | Freq: Every day | ORAL | Status: AC
  Administered 2023-02-25 – 2023-02-28 (×4): 250 mg via ORAL
  Filled 2023-02-24 (×4): qty 1

## 2023-02-24 MED ORDER — AZITHROMYCIN 500 MG PO TABS
500.0000 mg | ORAL_TABLET | Freq: Every day | ORAL | Status: AC
  Administered 2023-02-24: 500 mg via ORAL
  Filled 2023-02-24: qty 1

## 2023-02-24 MED ORDER — THIAMINE MONONITRATE 100 MG PO TABS
100.0000 mg | ORAL_TABLET | Freq: Every day | ORAL | Status: DC
  Administered 2023-02-24 – 2023-03-02 (×7): 100 mg via ORAL
  Filled 2023-02-24 (×7): qty 1

## 2023-02-24 MED ORDER — METHYLPREDNISOLONE SODIUM SUCC 125 MG IJ SOLR
125.0000 mg | Freq: Once | INTRAMUSCULAR | Status: AC
  Administered 2023-02-24: 125 mg via INTRAVENOUS
  Filled 2023-02-24: qty 2

## 2023-02-24 MED ORDER — ADULT MULTIVITAMIN W/MINERALS CH
1.0000 | ORAL_TABLET | Freq: Every day | ORAL | Status: DC
  Administered 2023-02-24 – 2023-03-02 (×7): 1 via ORAL
  Filled 2023-02-24 (×7): qty 1

## 2023-02-24 MED ORDER — MONTELUKAST SODIUM 10 MG PO TABS
10.0000 mg | ORAL_TABLET | Freq: Every day | ORAL | Status: DC
  Administered 2023-02-24 – 2023-03-01 (×6): 10 mg via ORAL
  Filled 2023-02-24 (×6): qty 1

## 2023-02-24 MED ORDER — SODIUM CHLORIDE 0.9 % IV BOLUS
1000.0000 mL | Freq: Once | INTRAVENOUS | Status: AC
  Administered 2023-02-24: 1000 mL via INTRAVENOUS

## 2023-02-24 MED ORDER — MOMETASONE FURO-FORMOTEROL FUM 200-5 MCG/ACT IN AERO
2.0000 | INHALATION_SPRAY | Freq: Two times a day (BID) | RESPIRATORY_TRACT | Status: DC
  Administered 2023-02-25 – 2023-03-02 (×11): 2 via RESPIRATORY_TRACT
  Filled 2023-02-24 (×2): qty 8.8

## 2023-02-24 MED ORDER — SODIUM CHLORIDE 0.9 % IV SOLN: INTRAVENOUS | Status: DC

## 2023-02-24 MED ORDER — IPRATROPIUM-ALBUTEROL 0.5-2.5 (3) MG/3ML IN SOLN
3.0000 mL | RESPIRATORY_TRACT | Status: DC
  Administered 2023-02-24 – 2023-02-25 (×4): 3 mL via RESPIRATORY_TRACT
  Filled 2023-02-24 (×4): qty 3

## 2023-02-24 MED ORDER — ONDANSETRON HCL 4 MG/2ML IJ SOLN: 4.0000 mg | Freq: Three times a day (TID) | INTRAMUSCULAR | Status: DC | PRN

## 2023-02-24 MED ORDER — METHYLPREDNISOLONE SODIUM SUCC 40 MG IJ SOLR
40.0000 mg | Freq: Two times a day (BID) | INTRAMUSCULAR | Status: DC
  Administered 2023-02-24 – 2023-02-28 (×8): 40 mg via INTRAVENOUS
  Filled 2023-02-24 (×10): qty 1

## 2023-02-24 MED ORDER — IPRATROPIUM-ALBUTEROL 0.5-2.5 (3) MG/3ML IN SOLN
3.0000 mL | Freq: Once | RESPIRATORY_TRACT | Status: AC
  Administered 2023-02-24: 3 mL via RESPIRATORY_TRACT
  Filled 2023-02-24: qty 3

## 2023-02-24 MED ORDER — ENSURE ENLIVE PO LIQD
237.0000 mL | Freq: Three times a day (TID) | ORAL | Status: DC
  Administered 2023-02-24 – 2023-02-28 (×11): 237 mL via ORAL

## 2023-02-24 MED ORDER — NICOTINE 21 MG/24HR TD PT24
21.0000 mg | MEDICATED_PATCH | Freq: Every day | TRANSDERMAL | Status: DC
  Administered 2023-02-25 – 2023-03-02 (×6): 21 mg via TRANSDERMAL
  Filled 2023-02-24 (×7): qty 1

## 2023-02-24 NOTE — H&P (Signed)
History and Physical    [name] ZOX:[id number] DOB: 1946-01-11 DOA: 02/24/2023  Referring MD/NP/PA:   PCP: [name], MD   Patient coming from:  The patient is coming from home.    Chief Complaint: SOB and AMS  HPI: [name] is a 77 y.o. female with medical history significant of  COPD (on 2-3L oxygen), hypertension, hyperlipidemia, PVD, CAD, hypothyroidism, GERD, depression with anxiety, anemia, former smoker, medication noncompliance, who presents with shortness of breath and AMS.  Patient has AMS,  and is unable to provide accurate medical history. I call her POA , Shawn Paris by phone. Per her POA, at normal baseline, pt is alert most of the times, but sometimes pt can become confused and hallucinating.  Since yesterday, patient has been confused again with hallucination, seeing horses. When I was pt in ED. Pt is is confused, knows her own name, not oriented to time and place.  She moves all extremities normally.  She denies any chest pain or abdominal pain. Patient has dry cough and SOB.  No active nausea, vomiting, diarrhea or abdominal pain.  Not sure if patient has symptoms of UTI.   Data reviewed independently and ED Course: pt was found to have WBC 9.4, negative COVID PCR, negative urinalysis, GFR> 60, temperature normal, blood pressure 163/83, heart rate 143, RR 24, oxygen saturation 98% on 2 L oxygen.  Chest x-ray negative.  CT head negative.  ABG with pH 7.27, CO2 44, O2 59.7.  Patient is admitted to telemetry bed as inpatient.  EKG: I have personally reviewed.  Seem to be sinus tachycardia, heart rate up to 140s.   Review of Systems: Could not reviewed due to altered mental status.   Allergy: No Known Allergies  Past Medical History:  Diagnosis Date   Anxiety    CAD (coronary artery disease)    Carpal tunnel syndrome    Cataract    Complication of anesthesia    has woken up at times   COPD (chronic obstructive pulmonary disease)    CRP elevated  09/14/2015   Depression    Difficult intubation    Dyslipidemia    Dyspnea    DOE   Dysrhythmia    hx palpatations   Elevated sedimentation rate 09/14/2015   Esophageal spasm    Gastrointestinal parasites    GERD (gastroesophageal reflux disease)    HCAP (healthcare-associated pneumonia) 10/22/2018   Hiatal hernia    History of peptic ulcer disease    Hypertension    Hyperthyroidism    Hypothyroidism    Low magnesium levels 09/14/2015   Pelvic fracture 2008   fall from riding a horse   Peripheral vascular disease    Reflux    Rotator cuff injury    Sepsis 10/22/2018   Stenosis, spinal, lumbar     Past Surgical History:  Procedure Laterality Date   AMPUTATION TOE Right 12/26/2020   Procedure: AMPUTATION TOE-3rd Toes;  Surgeon: Gwyneth Revels, DPM;  Location: [facility];  Service: Podiatry;  Laterality: Right;   AMPUTATION TOE Right 04/22/2021   Procedure: AMPUTATION TOE- RIGHT 2ND;  Surgeon: Gwyneth Revels, DPM;  Location: [facility];  Service: Podiatry;  Laterality: Right;   AMPUTATION TOE Right 10/21/2021   Procedure: AMPUTATION TOE;  Surgeon: Rosetta Posner, DPM;  Location: [facility];  Service: Podiatry;  Laterality: Right;   AMPUTATION TOE Right 11/23/2021   Procedure: AMPUTATION TOE;  Surgeon: Linus Galas, DPM;  Location: [facility];  Service: Podiatry;  Laterality: Right;  AMPUTATION TOE Right 07/05/2022   Procedure: AMPUTATION RIGHT 5TH TOE;  Surgeon: Edwin Cap, DPM;  Location: [facility];  Service: Podiatry;  Laterality: Right;   APPENDECTOMY     BACK SURGERY     CERVICAL FUSION   CARPAL TUNNEL RELEASE     CATARACT EXTRACTION W/PHACO Right 08/07/2017   Procedure: CATARACT EXTRACTION PHACO AND INTRAOCULAR LENS PLACEMENT (IOC);  Surgeon: Galen Manila, MD;  Location: [facility];  Service: Ophthalmology;  Laterality: Right;  [place] 00:52.0 AP% 16.8 CDE 8.74 Fluid Pack Lot # [id number]   CATARACT EXTRACTION W/PHACO Left 09/04/2017   Procedure: CATARACT EXTRACTION PHACO AND  INTRAOCULAR LENS PLACEMENT (IOC);  Surgeon: Galen Manila, MD;  Location: [facility];  Service: Ophthalmology;  Laterality: Left;  [place] 00:34 AP% 17.0 CDE 5.80 Fluid pack lot # [id number]   CESAREAN SECTION     CHOLECYSTECTOMY     FRACTURE SURGERY     HIP SURGERY     INTRAMEDULLARY (IM) NAIL INTERTROCHANTERIC Right 10/01/2016   Procedure: INTRAMEDULLARY (IM) NAIL INTERTROCHANTRIC;  Surgeon: Christena Flake, MD;  Location: [facility];  Service: Orthopedics;  Laterality: Right;   INTRAMEDULLARY (IM) NAIL INTERTROCHANTERIC Left 01/14/2021   Procedure: INTRAMEDULLARY (IM) NAIL INTERTROCHANTRIC;  Surgeon: Lyndle Herrlich, MD;  Location: [facility];  Service: Orthopedics;  Laterality: Left;   KYPHOPLASTY N/A 08/20/2018   Procedure: [id number];  Surgeon: Kennedy Bucker, MD;  Location: [facility];  Service: Orthopedics;  Laterality: N/A;   LOWER EXTREMITY ANGIOGRAPHY Right 12/18/2019   Procedure: LOWER EXTREMITY ANGIOGRAPHY;  Surgeon: Annice Needy, MD;  Location: [facility];  Service: Cardiovascular;  Laterality: Right;   LOWER EXTREMITY ANGIOGRAPHY Right 12/02/2020   Procedure: LOWER EXTREMITY ANGIOGRAPHY;  Surgeon: Annice Needy, MD;  Location: [facility];  Service: Cardiovascular;  Laterality: Right;   LOWER EXTREMITY ANGIOGRAPHY Right 12/23/2020   Procedure: Lower Extremity Angiography;  Surgeon: Annice Needy, MD;  Location: [facility];  Service: Cardiovascular;  Laterality: Right;   LOWER EXTREMITY ANGIOGRAPHY Right 12/24/2020   Procedure: Lower Extremity Angiography;  Surgeon: Annice Needy, MD;  Location: [facility];  Service: Cardiovascular;  Laterality: Right;   LOWER EXTREMITY ANGIOGRAPHY Right 10/20/2021   Procedure: Lower Extremity Angiography;  Surgeon: Annice Needy, MD;  Location: [facility];  Service: Cardiovascular;  Laterality: Right;   LOWER EXTREMITY ANGIOGRAPHY Right 07/03/2022   Procedure: Lower Extremity Angiography;  Surgeon: Annice Needy, MD;   Location: [facility];  Service: Cardiovascular;  Laterality: Right;   NECK SURGERY     NOSE SURGERY     ROTATOR CUFF REPAIR     x2   SHOULDER ARTHROSCOPY  12/07/2011   Procedure: ARTHROSCOPY SHOULDER;  Surgeon: Loreta Ave, MD;  Location: [facility];  Service: Orthopedics;  Laterality: Right;  Debridement Partial Cuff Tear, Release Coracoacromial Ligament   SHOULDER SURGERY  12/07/2011   right    Social History:  reports that she has been smoking cigarettes. She has never used smokeless tobacco. She reports that she does not drink alcohol and does not use drugs.  Family History:  Family History  Problem Relation Age of Onset   Alcoholism Brother    Aneurysm Other      Prior to Admission medications   Medication Sig Start Date End Date Taking? Authorizing Provider  albuterol (VENTOLIN HFA) 108 (90 Base) MCG/ACT inhaler Inhale 1-2 puffs into the lungs every 4 (four) hours as needed for shortness  of breath or wheezing. 06/28/22   [provider]  ALPRAZolam Prudy Feeler) 1 MG tablet Take 1 mg by mouth 3 (three) times daily as needed for anxiety.    [provider]  amLODipine (NORVASC) 5 MG tablet Take 1 tablet (5 mg total) by mouth daily. 11/10/22   Arnetha Courser, MD  ammonium lactate (LAC-HYDRIN) 12 % lotion APPLY TO AFFECTED AREA AS NEEDED FOR DRY SKIN 01/02/23   Candelaria Stagers, DPM  apixaban (ELIQUIS) 5 MG TABS tablet Take 5 mg by mouth 2 (two) times daily.    [provider]  ASPIRIN LOW DOSE 81 MG tablet Take 81 mg by mouth daily. 12/29/22   [provider]  atorvastatin (LIPITOR) 10 MG tablet Take 1 tablet (10 mg total) by mouth daily. 11/10/22 11/10/23  Arnetha Courser, MD  COMBIVENT RESPIMAT 20-100 MCG/ACT AERS respimat Inhale 1-2 puffs into the lungs 4 (four) times daily. 11/25/21   Alford Highland, MD  feeding supplement (ENSURE ENLIVE / ENSURE PLUS) LIQD Take 237 mLs by mouth 3 (three) times daily between meals. 02/03/23   Darlin Priestly, MD  fluticasone-salmeterol (ADVAIR) 250-50 MCG/ACT AEPB Inhale 1 puff into the lungs 2 (two) times daily as needed.    [provider]  furosemide (LASIX) 40 MG tablet Take 1 tablet (40 mg total) by mouth daily for 7 days. 02/05/23 02/12/23  Darlin Priestly, MD  levothyroxine (SYNTHROID) 50 MCG tablet Take 50 mcg by mouth every morning.    [provider]  metoprolol tartrate (LOPRESSOR) 50 MG tablet Take 1 tablet (50 mg total) by mouth 2 (two) times daily. 11/10/22 02/08/23  Arnetha Courser, MD  montelukast (SINGULAIR) 10 MG tablet Take 10 mg by mouth at bedtime.    [provider]  Multiple Vitamin (MULTIVITAMIN WITH MINERALS) TABS tablet Take 1 tablet by mouth daily. 11/10/22   Arnetha Courser, MD  omeprazole (PRILOSEC) 40 MG capsule Take 40 mg by mouth daily.    [provider]  thiamine (VITAMIN B-1) 100 MG tablet Take 1 tablet (100 mg total) by mouth daily. 11/10/22   Arnetha Courser, MD  traZODone (DESYREL) 150 MG tablet Take 300 mg by mouth at bedtime.    [provider]    Physical Exam: Vitals:   02/24/23 1715 02/24/23 1730 02/24/23 1745 02/24/23 1800  BP: (!) 153/92 (!) 170/88 (!) 160/82 (!) 174/92  Pulse: (!) 126 (!) 129 (!) 128 (!) 130  Resp: (!) 29 (!) 30 (!) 22 (!) 27  Temp:      TempSrc:      SpO2: 100% 100% 100% 100%  Weight:       General: Not in acute distress.  Dry mucous membrane HEENT:       Eyes: PERRL, EOMI, no scleral icterus.       ENT: No discharge from the ears and nose       Neck: No JVD, no bruit, no mass felt. Heme: No neck lymph node enlargement. Cardiac: S1/S2, RRR, No murmurs, No gallops or rubs. Respiratory: has decreased air movement bilaterally, has mild wheezing bilaterally GI: Soft, nondistended, nontender, no organomegaly, BS present. GU: No hematuria Ext: No pitting leg edema bilaterally. 1+DP/PT pulse bilaterally. Musculoskeletal: No joint deformities, No joint redness or warmth, no limitation of ROM in  spin. Skin: No rashes.  Neuro: Confused, knows her own name, not fully following command, not oriented to time and place.  Cranial nerves II-XII grossly intact, moves all extremities  Psych: has hallucination  Labs on  Admission: I have personally reviewed following labs and imaging studies  CBC: Recent Labs  Lab 02/24/23 1106  WBC 9.4  NEUTROABS 8.8*  HGB 10.4*  HCT 34.8*  MCV 90.2  PLT 298   Basic Metabolic Panel: Recent Labs  Lab 02/24/23 1106  NA 142  K 4.1  CL 102  CO2 21*  GLUCOSE 90  BUN 20  CREATININE 0.80  CALCIUM 8.9   GFR: Estimated Creatinine Clearance: 45.3 mL/min (by C-G formula based on SCr of 0.8 mg/dL). Liver Function Tests: Recent Labs  Lab 02/24/23 1106  AST 21  ALT 17  ALKPHOS 83  BILITOT 1.7*  PROT 7.1  ALBUMIN 3.7   No results for input(s): "LIPASE", "AMYLASE" in the last 168 hours. No results for input(s): "AMMONIA" in the last 168 hours. Coagulation Profile: No results for input(s): "INR", "PROTIME" in the last 168 hours. Cardiac Enzymes: No results for input(s): "CKTOTAL", "CKMB", "CKMBINDEX", "TROPONINI" in the last 168 hours. BNP (last 3 results) No results for input(s): "PROBNP" in the last 8760 hours. HbA1C: No results for input(s): "HGBA1C" in the last 72 hours. CBG: No results for input(s): "GLUCAP" in the last 168 hours. Lipid Profile: No results for input(s): "CHOL", "HDL", "LDLCALC", "TRIG", "CHOLHDL", "LDLDIRECT" in the last 72 hours. Thyroid Function Tests: No results for input(s): "TSH", "T4TOTAL", "FREET4", "T3FREE", "THYROIDAB" in the last 72 hours. Anemia Panel: No results for input(s): "VITAMINB12", "FOLATE", "FERRITIN", "TIBC", "IRON", "RETICCTPCT" in the last 72 hours. Urine analysis:    Component Value Date/Time   COLORURINE YELLOW (A) 02/24/2023 1251   APPEARANCEUR HAZY (A) 02/24/2023 1251   LABSPEC 1.020 02/24/2023 1251   PHURINE 5.0 02/24/2023 1251   GLUCOSEU NEGATIVE 02/24/2023 1251   HGBUR NEGATIVE  02/24/2023 1251   BILIRUBINUR NEGATIVE 02/24/2023 1251   KETONESUR 80 (A) 02/24/2023 1251   PROTEINUR 30 (A) 02/24/2023 1251   NITRITE NEGATIVE 02/24/2023 1251   LEUKOCYTESUR NEGATIVE 02/24/2023 1251   Sepsis Labs: @LABRCNTIP (procalcitonin:4,lacticidven:4) ) Recent Results (from the past 240 hour(s))  Resp panel by RT-PCR (RSV, Flu A&B, Covid) Anterior Nasal Swab     Status: None   Collection Time: 02/24/23 12:07 PM   Specimen: Anterior Nasal Swab  Result Value Ref Range Status   SARS Coronavirus 2 by RT PCR NEGATIVE NEGATIVE Final    Comment: (NOTE) SARS-CoV-2 target nucleic acids are NOT DETECTED.  The SARS-CoV-2 RNA is generally detectable in upper respiratory specimens during the acute phase of infection. The lowest concentration of SARS-CoV-2 viral copies this assay can detect is 138 copies/mL. A negative result does not preclude SARS-Cov-2 infection and should not be used as the sole basis for treatment or other patient management decisions. A negative result may occur with  improper specimen collection/handling, submission of specimen other than nasopharyngeal swab, presence of viral mutation(s) within the areas targeted by this assay, and inadequate number of viral copies(<138 copies/mL). A negative result must be combined with clinical observations, patient history, and epidemiological information. The expected result is Negative.  Fact Sheet for Patients:  BloggerCourse.com  Fact Sheet for Healthcare Providers:  SeriousBroker.it  This test is no t yet approved or cleared by the [place] FDA and  has been authorized for detection and/or diagnosis of SARS-CoV-2 by FDA under an Emergency Use Authorization (EUA). This EUA will remain  in effect (meaning this test can be used) for the duration of the COVID-19 declaration under Section 564(b)(1) of the Act, 21 U.S.C.section 360bbb-3(b)(1), unless the authorization is  terminated  or  revoked sooner.       Influenza A by PCR NEGATIVE NEGATIVE Final   Influenza B by PCR NEGATIVE NEGATIVE Final    Comment: (NOTE) The Xpert Xpress SARS-CoV-2/FLU/RSV plus assay is intended as an aid in the diagnosis of influenza from Nasopharyngeal swab specimens and should not be used as a sole basis for treatment. Nasal washings and aspirates are unacceptable for Xpert Xpress SARS-CoV-2/FLU/RSV testing.  Fact Sheet for Patients: BloggerCourse.com  Fact Sheet for Healthcare Providers: SeriousBroker.it  This test is not yet approved or cleared by the [place] FDA and has been authorized for detection and/or diagnosis of SARS-CoV-2 by FDA under an Emergency Use Authorization (EUA). This EUA will remain in effect (meaning this test can be used) for the duration of the COVID-19 declaration under Section 564(b)(1) of the Act, 21 U.S.C. section 360bbb-3(b)(1), unless the authorization is terminated or revoked.     Resp Syncytial Virus by PCR NEGATIVE NEGATIVE Final    Comment: (NOTE) Fact Sheet for Patients: BloggerCourse.com  Fact Sheet for Healthcare Providers: SeriousBroker.it  This test is not yet approved or cleared by the [place] FDA and has been authorized for detection and/or diagnosis of SARS-CoV-2 by FDA under an Emergency Use Authorization (EUA). This EUA will remain in effect (meaning this test can be used) for the duration of the COVID-19 declaration under Section 564(b)(1) of the Act, 21 U.S.C. section 360bbb-3(b)(1), unless the authorization is terminated or revoked.  Performed at [facility], [address] [place], [place] [address]      Radiological Exams on Admission: CT Head Wo Contrast  Result Date: 02/24/2023 CLINICAL DATA:  Mental status change, unknown cause EXAM: CT HEAD WITHOUT CONTRAST TECHNIQUE: Contiguous  axial images were obtained from the base of the skull through the vertex without intravenous contrast. RADIATION DOSE REDUCTION: This exam was performed according to the departmental dose-optimization program which includes automated exposure control, adjustment of the mA and/or kV according to patient size and/or use of iterative reconstruction technique. COMPARISON:  11/08/2022 FINDINGS: Brain: No evidence of acute infarction, hemorrhage, hydrocephalus, extra-axial collection or mass lesion/mass effect. Moderate low-density changes within the periventricular and subcortical white matter most compatible with chronic microvascular ischemic change. Mild diffuse cerebral volume loss. Vascular: Atherosclerotic calcifications involving the large vessels of the skull base. No unexpected hyperdense vessel. Skull: Normal. Negative for fracture or focal lesion. Sinuses/Orbits: No acute finding. Other: None. IMPRESSION: 1. No acute intracranial findings. 2. Chronic microvascular ischemic change and cerebral volume loss. Electronically Signed   By: Duanne Guess D.O.   On: 02/24/2023 14:10   DG Chest Port 1 View  Result Date: 02/24/2023 CLINICAL DATA:  Skin fold EXAM: PORTABLE CHEST 1 VIEW COMPARISON:  Same day chest x-ray FINDINGS: Frontal chest x-ray obtained after patient repositioning. Previously seen skin fold is no longer evident. There is no pneumothorax. No new focal airspace consolidation. Stable heart size. IMPRESSION: No pneumothorax. Electronically Signed   By: Duanne Guess D.O.   On: 02/24/2023 12:06   DG Chest Port 1 View  Result Date: 02/24/2023 CLINICAL DATA:  Shortness of breath EXAM: PORTABLE CHEST 1 VIEW COMPARISON:  01/30/2023 FINDINGS: Stable cardiomediastinal contours. Aortic atherosclerosis. No focal airspace consolidation. Presumed skin fold at the periphery of the left lung base. Chronically coarsened interstitial markings bilaterally. No focal consolidation. No pleural effusion. No  right-sided pneumothorax. IMPRESSION: 1. Presumed skin fold at the periphery of the left lung base. Repeat frontal chest radiograph after patient repositioning is recommended to exclude  a small left pneumothorax. 2. Chronic lung changes without focal airspace consolidation. Electronically Signed   By: Duanne GuessNicholas  Plundo D.O.   On: 02/24/2023 11:37      Assessment/Plan Principal Problem:   COPD exacerbation Active Problems:   Acute metabolic encephalopathy   CAD (coronary artery disease)   Myocardial injury   Essential hypertension   HLD (hyperlipidemia)   Hypothyroidism   Tobacco use disorder   Anxiety and depression   Protein-calorie malnutrition, severe   Sinus tachycardia   Assessment and Plan:   COPD exacerbation: Patient has shortness of breath, wheezing, decreased air movement on auscultation, clinically consistent with COPD exacerbation.  Chest x-ray negative for infiltration. Pt has CTA on 01/30/2023 which was negative for PE.  Today I have low suspicions of PE.  - will admit to tele bed as inpatient -Bronchodilators -Solu-Medrol 40 mg IV bid - Z pak -Mucinex for cough  -Incentive spirometry -sputum culture -Nasal cannula oxygen as needed to maintain O2 saturation 93% or greater  Acute metabolic encephalopathy: Etiology is not clear.  CT head negative.  Patient has hallucination.  Potential differential diagnosis include dementia with behavior change and psychosis. -Consulted [facility] of behavioral [facility] -check Vb12 level  CAD (coronary artery disease) and myocardial injury: Troponin level 18 --> 18 -ASA, Lipitor   Essential hypertension: -IV hydralazine as needed -Amlodipine,, metoprolol   Hypothyroidism: Continue Synthroid   Anxiety and depression: -Hold Xanax due to altered mental status   Protein-calorie malnutrition, severe: Body weight 47.2 kg, BMI 17.85 -Ensure -Nutrition consult   HLD (hyperlipidemia) -Lipitor  Tobacco use disorder -Nicotine    Sinus tachycardia: Heart rate up to 140s.  Seem to be sinus rhythm.  Etiology is not clear.  Patient is clinically dehydrated. -IV fluid: 2.5 L normal saline -As needed metoprolol IV 5 mg every 2 hour for heart rate> 125 -Continue home metoprolol 50 mg twice daily     DVT ppx: sQ Lovenox  Code Status: DNR per Levora DredgeShawn Paris who is POA  Family Communication: called Shawn Paris who is POA  by phone  Disposition Plan:  Anticipate discharge back to previous environment  Consults called:  none  Admission status and Level of care: Progressive:   as inpt      Dispo: The patient is from: Home              Anticipated d/c is to: Home              Anticipated d/c date is: 2 days              Patient currently is not medically stable to d/c.    Severity of Illness:  The appropriate patient status for this patient is INPATIENT. Inpatient status is judged to be reasonable and necessary in order to provide the required intensity of service to ensure the patient's safety. The patient's presenting symptoms, physical exam findings, and initial radiographic and laboratory data in the context of their chronic comorbidities is felt to place them at high risk for further clinical deterioration. Furthermore, it is not anticipated that the patient will be medically stable for discharge from the hospital within 2 midnights of admission.   * I certify that at the point of admission it is my clinical judgment that the patient will require inpatient hospital care spanning beyond 2 midnights from the point of admission due to high intensity of service, high risk for further deterioration and high frequency of surveillance required.*  Date of Service 02/24/2023    Lorretta HarpXilin Mariano Doshi [facility]   If 7PM-7AM, please contact night-coverage [email or web address] 02/24/2023, 6:10 PM

## 2023-02-24 NOTE — ED Triage Notes (Signed)
Pt ems from home for AMS that has gotten worse over last day. Per ems pts hr 140's ST, cbg 104. Pt on 4LNC at home. Ems gave 1 duoneb in route

## 2023-02-24 NOTE — Consult Note (Signed)
Client was very agitated on attempted assessment, she was talking about someone taking forks and moving things.  "This is not acceptable and I'm not going to have it", swinging her arms.  She was calm prior to the conversation, PRN ordered and psych will continue to follow.  Nanine Means, PMHNP

## 2023-02-24 NOTE — ED Provider Notes (Signed)
[facility] Provider Note    Event Date/Time   First MD Initiated Contact with Patient 02/24/23 1108     (approximate)   History   Altered Mental Status and Shortness of Breath   HPI  [name] is a 77 y.o. female with a history of COPD (on 2 to 3 L O2 at home), CAD, hypertension, PVD, hypothyroidism, depression, and anxiety who presents with altered mental status and shortness of breath.  EMS reports that they were initially called out due to hallucinations and the caregiver stated that the patient was altered compared to baseline.  The patient was also noted to be short of breath and tachycardic.  The patient denies acute pain.  She does endorse shortness of breath and cough.  She reports mild swelling to her ankles.  I reviewed the past medical records.  The patient was admitted last month.  She presented with respiratory distress and was treated primarily for COPD exacerbation although workup also showed possible acute pulmonary edema and she received IV Lasix.   Physical Exam   Triage Vital Signs: ED Triage Vitals  Enc Vitals Group     BP      Pulse      Resp      Temp      Temp src      SpO2      Weight      Height      Head Circumference      Peak Flow      Pain Score      Pain Loc      Pain Edu?      Excl. in GC?     Most recent vital signs: Vitals:   02/24/23 1230 02/24/23 1300  BP: (!) 169/78 (!) 163/83  Pulse: (!) 134 (!) 138  Resp: (!) 24 (!) 29  Temp:    SpO2: 99% 100%     General: Alert, oriented x 2, uncomfortable appearing but in no acute distress. CV:  Good peripheral perfusion.  Tachycardic, otherwise normal heart sounds. Resp:  Increased respiratory effort.  Diffuse rales and rhonchi. Abd:  No distention.  Other:  No significant peripheral edema.  Dry mucous membranes.   ED Results / Procedures / Treatments   Labs (all labs ordered are listed, but only abnormal results are displayed) Labs Reviewed   COMPREHENSIVE METABOLIC PANEL - Abnormal; Notable for the following components:      Result Value   CO2 21 (*)    Total Bilirubin 1.7 (*)    Anion gap 19 (*)    All other components within normal limits  CBC WITH DIFFERENTIAL/PLATELET - Abnormal; Notable for the following components:   RBC 3.86 (*)    Hemoglobin 10.4 (*)    HCT 34.8 (*)    MCHC 29.9 (*)    Neutro Abs 8.8 (*)    Lymphs Abs 0.4 (*)    All other components within normal limits  BLOOD GAS, VENOUS - Abnormal; Notable for the following components:   Acid-base deficit 6.6 (*)    All other components within normal limits  URINALYSIS, ROUTINE W REFLEX MICROSCOPIC - Abnormal; Notable for the following components:   Color, Urine YELLOW (*)    APPearance HAZY (*)    Ketones, ur 80 (*)    Protein, ur 30 (*)    All other components within normal limits  TROPONIN I (HIGH SENSITIVITY) - Abnormal; Notable for the following components:   Troponin I (High  Sensitivity) 18 (*)    All other components within normal limits  TROPONIN I (HIGH SENSITIVITY) - Abnormal; Notable for the following components:   Troponin I (High Sensitivity) 18 (*)    All other components within normal limits  RESP PANEL BY RT-PCR (RSV, FLU A&B, COVID)  [id number]  CULTURE, BLOOD (ROUTINE X 2)  CULTURE, BLOOD (ROUTINE X 2)  EXPECTORATED SPUTUM ASSESSMENT W GRAM STAIN, RFLX TO RESP C  BRAIN NATRIURETIC PEPTIDE  LACTIC ACID, PLASMA  PROCALCITONIN     EKG  ED ECG REPORT I, Dionne BucySebastian Murlene Revell, the attending physician, personally viewed and interpreted this ECG.  Date: 02/24/2023 EKG Time: 1203 Rate: 138  Rhythm: Sinus tachycardia QRS Axis: normal Intervals: normal ST/T Wave abnormalities: normal Narrative Interpretation: no evidence of acute ischemia    RADIOLOGY  Chest x-ray: I independently viewed and interpreted the images; there is no focal consolidation or edema.  Per radiology, initial x-ray showed a likely skinfold but pneumothorax could  not be ruled out.  Repeat x-ray shows no evidence of pneumothorax.  CT head: I independently viewed and interpreted the images; there is no ICH or other acute abnormality.  PROCEDURES:  Critical Care performed: Yes, see critical care procedure note(s)  .Critical Care  Performed by: Dionne BucySiadecki, Rissa Turley, MD Authorized by: Dionne BucySiadecki, Loxley Schmale, MD   Critical care provider statement:    Critical care time (minutes):  40   Critical care time was exclusive of:  Separately billable procedures and treating other patients   Critical care was necessary to treat or prevent imminent or life-threatening deterioration of the following conditions:  Respiratory failure   Critical care was time spent personally by me on the following activities:  Development of treatment plan with patient or surrogate, discussions with consultants, evaluation of patient's response to treatment, examination of patient, ordering and review of laboratory studies, ordering and review of radiographic studies, ordering and performing treatments and interventions, pulse oximetry, re-evaluation of patient's condition, review of old charts and obtaining history from patient or surrogate   Care discussed with: admitting provider      MEDICATIONS ORDERED IN ED: Medications  metoprolol tartrate (LOPRESSOR) injection 5 mg (5 mg Intravenous Given 02/24/23 1538)  nicotine (NICODERM CQ - dosed in mg/24 hours) patch 21 mg (21 mg Transdermal Not Given 02/24/23 1548)  ipratropium-albuterol (DUONEB) 0.5-2.5 (3) MG/3ML nebulizer solution 3 mL (3 mLs Nebulization Given 02/24/23 1540)  albuterol (PROVENTIL) (2.5 MG/3ML) 0.083% nebulizer solution 2.5 mg (has no administration in time range)  dextromethorphan-guaiFENesin (MUCINEX DM) 30-600 MG per 12 hr tablet 1 tablet (has no administration in time range)  azithromycin (ZITHROMAX) tablet 500 mg (500 mg Oral Given 02/24/23 1543)    Followed by  azithromycin (ZITHROMAX) tablet 250 mg (has no  administration in time range)  ondansetron (ZOFRAN) injection 4 mg (has no administration in time range)  hydrALAZINE (APRESOLINE) injection 5 mg (5 mg Intravenous Given 02/24/23 1530)  acetaminophen (TYLENOL) tablet 650 mg (has no administration in time range)  enoxaparin (LOVENOX) injection 40 mg (has no administration in time range)  0.9 %  sodium chloride infusion (has no administration in time range)  aspirin EC tablet 81 mg (has no administration in time range)  amLODipine (NORVASC) tablet 5 mg (has no administration in time range)  atorvastatin (LIPITOR) tablet 10 mg (has no administration in time range)  metoprolol tartrate (LOPRESSOR) tablet 50 mg (has no administration in time range)  traZODone (DESYREL) tablet 300 mg (has no administration in time range)  pantoprazole (PROTONIX)  EC tablet 40 mg (has no administration in time range)  feeding supplement (ENSURE ENLIVE / ENSURE PLUS) liquid 237 mL (has no administration in time range)  multivitamin with minerals tablet 1 tablet (has no administration in time range)  thiamine (VITAMIN B1) tablet 100 mg (has no administration in time range)  mometasone-formoterol (DULERA) 200-5 MCG/ACT inhaler 2 puff (has no administration in time range)  montelukast (SINGULAIR) tablet 10 mg (has no administration in time range)  levothyroxine (SYNTHROID) tablet 50 mcg (has no administration in time range)  ipratropium-albuterol (DUONEB) 0.5-2.5 (3) MG/3ML nebulizer solution 3 mL (3 mLs Nebulization Given 02/24/23 1149)  ipratropium-albuterol (DUONEB) 0.5-2.5 (3) MG/3ML nebulizer solution 3 mL (3 mLs Nebulization Given 02/24/23 1150)  methylPREDNISolone sodium succinate (SOLU-MEDROL) 125 mg/2 mL injection 125 mg (125 mg Intravenous Given 02/24/23 1156)  sodium chloride 0.9 % bolus 1,000 mL (1,000 mLs Intravenous New Bag/Given 02/24/23 1155)  sodium chloride 0.9 % bolus 1,000 mL (1,000 mLs Intravenous New Bag/Given 02/24/23 1432)     IMPRESSION / MDM / ASSESSMENT  AND PLAN / ED COURSE  I reviewed the triage vital signs and the nursing notes.  77 year old female with PMH as noted above and status post recent admission for COPD exacerbation presents with concern for altered mental status as well as increased shortness of breath and tachycardia.  On exam the patient is alert and relatively oriented, and demonstrates diffuse rales and rhonchi with increased work of breathing, but is speaking in full sentences.  She is in sinus tachycardia to the 140s.  Exam is otherwise negative for acute findings.  Differential diagnosis includes, but is not limited to, COPD exacerbation, acute pulmonary edema, new onset CHF, pneumonia, bronchitis, viral syndrome.  We will obtain chest x-ray, lab workup including cardiac enzymes and BNP to rule out acute CHF, respiratory panel, give bronchodilators, steroid, and reassess.  I did consider BiPAP but the patient declines it at this time unless she gets anxiety medication which I would prefer to avoid if possible due to concern that the patient's respiratory effort will decrease, and since she is able to speak in full sentences and is maintaining oxygenation she does not require it at this time.  Patient's presentation is most consistent with acute presentation with potential threat to life or bodily function.  The patient is on the cardiac monitor to evaluate for evidence of arrhythmia and/or significant heart rate changes.  ----------------------------------------- 2:37 PM on 02/24/2023 -----------------------------------------   Lab workup is overall unremarkable.  VBG shows a normal CO2.  There is no leukocytosis.  Electrolytes are unremarkable.  Respiratory panel is negative.  Troponin is minimally elevated.  The patient remains significantly tachycardic after a liter of fluids.  Her respiratory status has improved.  She is still on O2 by nasal cannula.  I will order additional fluids.  The patient does not need any rate  control given that this is sinus tachycardia.  Given that the patient does appear slightly altered and confused I obtained a CT head which is negative.  I consulted Dr. Clyde Lundborg from the hospitalist service; based our discussion he agrees to admit the patient.   FINAL CLINICAL IMPRESSION(S) / ED DIAGNOSES   Final diagnoses:  COPD exacerbation  Altered mental status, unspecified altered mental status type     Rx / DC Orders   ED Discharge Orders     None        Note:  This document was prepared using Dragon voice recognition software and may include unintentional  dictation errors.    Dionne Bucy, MD 02/24/23 [phone]

## 2023-02-25 ENCOUNTER — Encounter: Payer: Self-pay | Admitting: Internal Medicine

## 2023-02-25 DIAGNOSIS — J441 Chronic obstructive pulmonary disease with (acute) exacerbation: Secondary | ICD-10-CM | POA: Diagnosis not present

## 2023-02-25 DIAGNOSIS — R410 Disorientation, unspecified: Secondary | ICD-10-CM | POA: Diagnosis present

## 2023-02-25 LAB — BASIC METABOLIC PANEL
Anion gap: 10 (ref 5–15)
BUN: 14 mg/dL (ref 8–23)
CO2: 21 mmol/L — ABNORMAL LOW (ref 22–32)
Calcium: 8.1 mg/dL — ABNORMAL LOW (ref 8.9–10.3)
Chloride: 108 mmol/L (ref 98–111)
Creatinine, Ser: 0.56 mg/dL (ref 0.44–1.00)
GFR, Estimated: >60 mL/min (ref >60)
Glucose, Bld: 143 mg/dL — ABNORMAL HIGH (ref 70–99)
Potassium: 3.3 mmol/L — ABNORMAL LOW (ref 3.5–5.1)
Sodium: 139 mmol/L (ref 135–145)

## 2023-02-25 LAB — CBC
HCT: 30 % — ABNORMAL LOW (ref 36.0–46.0)
Hemoglobin: 9.3 g/dL — ABNORMAL LOW (ref 12.0–15.0)
MCH: 27.6 pg (ref 26.0–34.0)
MCHC: 31 g/dL (ref 30.0–36.0)
MCV: 89 fL (ref 80.0–100.0)
Platelets: 216 10*3/uL (ref 150–400)
RBC: 3.37 MIL/uL — ABNORMAL LOW (ref 3.87–5.11)
RDW: 15.9 % — ABNORMAL HIGH (ref 11.5–15.5)
WBC: 8 10*3/uL (ref 4.0–10.5)
nRBC: 0 % (ref 0.0–0.2)

## 2023-02-25 LAB — CULTURE, BLOOD (ROUTINE X 2): Culture: NO GROWTH

## 2023-02-25 MED ORDER — IPRATROPIUM-ALBUTEROL 0.5-2.5 (3) MG/3ML IN SOLN
3.0000 mL | Freq: Three times a day (TID) | RESPIRATORY_TRACT | Status: DC
  Administered 2023-02-25 – 2023-02-28 (×9): 3 mL via RESPIRATORY_TRACT
  Filled 2023-02-25 (×9): qty 3

## 2023-02-25 MED ORDER — RISPERIDONE 0.25 MG PO TABS
0.2500 mg | ORAL_TABLET | Freq: Every day | ORAL | Status: DC
  Administered 2023-02-25 – 2023-03-01 (×5): 0.25 mg via ORAL
  Filled 2023-02-25 (×5): qty 1

## 2023-02-25 MED ORDER — POTASSIUM CHLORIDE CRYS ER 20 MEQ PO TBCR
40.0000 meq | EXTENDED_RELEASE_TABLET | Freq: Once | ORAL | Status: AC
  Administered 2023-02-25: 40 meq via ORAL
  Filled 2023-02-25: qty 2

## 2023-02-25 MED ORDER — HALOPERIDOL LACTATE 5 MG/ML IJ SOLN
5.0000 mg | Freq: Four times a day (QID) | INTRAMUSCULAR | Status: DC | PRN
  Administered 2023-02-25 – 2023-03-01 (×3): 5 mg via INTRAMUSCULAR
  Filled 2023-02-25 (×3): qty 1

## 2023-02-25 NOTE — Progress Notes (Signed)
Patient got anxious, yelling at everyone, MD made aware of. Dr, ordered Inj. Halodol 5 mg, IM, given.

## 2023-02-25 NOTE — Consult Note (Signed)
[facility] Face-to-Face Psychiatry Consult   Reason for Consult:  agitation, AMS,  Referring Physician:  DR Clyde Lundborg Patient Identification: [name] MRN:  [id number] Principal Diagnosis: COPD exacerbation Diagnosis:  Principal Problem:   COPD exacerbation Active Problems:   HLD (hyperlipidemia)   Hypothyroidism   Tobacco use disorder   Anxiety and depression   Protein-calorie malnutrition, severe   Essential hypertension   CAD (coronary artery disease)   Myocardial injury   Acute metabolic encephalopathy   Sinus tachycardia   Acute delirium   Total Time spent with patient: 45 minutes  Subjective:   [name] is a 77 y.o. female patient admitted with AMS.  HPI:  77 yo female presented to the ED with AMS and agitation, hallucinations.  On initial assessment in the ED, she quickly got agitated and started yelling, Risperdal given with relief.  Today, she is sitting up in bed eating her breakfast.  She is alert and oriented on assessment.  She stated she has situational depression upon receiving bad news, like told she has to have surgery or if surgery went "bad", otherwise, she is fine.  Anxiety is mild with no panic attacks.  No substance use.  Her appetite is "pretty good", sleep is "bad" related to R toe or hell pain.  She was not appearing to be responding to internal stimuli but in the conversations she said she could not be side with Koala bear sitting outside her window.  These symptoms should resolve when her underlying medical issue resolves.  Risperdal 0.25 mg at bedtime in the hospital until her delirium resolves.     Past Psychiatric History: none  Risk to Self:  none Risk to Others:  none Prior Inpatient Therapy:  none Prior Outpatient Therapy:  none  Past Medical History:  Past Medical History:  Diagnosis Date   Anxiety    CAD (coronary artery disease)    Carpal tunnel syndrome    Cataract    Complication of anesthesia    has woken up at times   COPD (chronic  obstructive pulmonary disease)    CRP elevated 09/14/2015   Depression    Difficult intubation    Dyslipidemia    Dyspnea    DOE   Dysrhythmia    hx palpatations   Elevated sedimentation rate 09/14/2015   Esophageal spasm    Gastrointestinal parasites    GERD (gastroesophageal reflux disease)    HCAP (healthcare-associated pneumonia) 10/22/2018   Hiatal hernia    History of peptic ulcer disease    Hypertension    Hyperthyroidism    Hypothyroidism    Low magnesium levels 09/14/2015   Pelvic fracture 2008   fall from riding a horse   Peripheral vascular disease    Reflux    Rotator cuff injury    Sepsis 10/22/2018   Stenosis, spinal, lumbar     Past Surgical History:  Procedure Laterality Date   AMPUTATION TOE Right 12/26/2020   Procedure: AMPUTATION TOE-3rd Toes;  Surgeon: Gwyneth Revels, DPM;  Location: [facility];  Service: Podiatry;  Laterality: Right;   AMPUTATION TOE Right 04/22/2021   Procedure: AMPUTATION TOE- RIGHT 2ND;  Surgeon: Gwyneth Revels, DPM;  Location: [facility];  Service: Podiatry;  Laterality: Right;   AMPUTATION TOE Right 10/21/2021   Procedure: AMPUTATION TOE;  Surgeon: Rosetta Posner, DPM;  Location: [facility];  Service: Podiatry;  Laterality: Right;   AMPUTATION TOE Right 11/23/2021   Procedure: AMPUTATION TOE;  Surgeon: Linus Galas, DPM;  Location: [facility];  Service: Podiatry;  Laterality: Right;   AMPUTATION TOE Right 07/05/2022   Procedure: AMPUTATION RIGHT 5TH TOE;  Surgeon: Edwin CapMcDonald, Adam R, DPM;  Location: [facility];  Service: Podiatry;  Laterality: Right;   APPENDECTOMY     BACK SURGERY     CERVICAL FUSION   CARPAL TUNNEL RELEASE     CATARACT EXTRACTION W/PHACO Right 08/07/2017   Procedure: CATARACT EXTRACTION PHACO AND INTRAOCULAR LENS PLACEMENT (IOC);  Surgeon: Galen ManilaPorfilio, William, MD;  Location: [facility];  Service: Ophthalmology;  Laterality: Right;  US 00:52.0 AP% 16.8 CDE 8.74 Fluid Pack Lot # [id number]   CATARACT EXTRACTION W/PHACO Left 09/04/2017    Procedure: CATARACT EXTRACTION PHACO AND INTRAOCULAR LENS PLACEMENT (IOC);  Surgeon: Galen ManilaPorfilio, William, MD;  Location: [facility];  Service: Ophthalmology;  Laterality: Left;  US 00:34 AP% 17.0 CDE 5.80 Fluid pack lot # [id number]   CESAREAN SECTION     CHOLECYSTECTOMY     FRACTURE SURGERY     HIP SURGERY     INTRAMEDULLARY (IM) NAIL INTERTROCHANTERIC Right 10/01/2016   Procedure: INTRAMEDULLARY (IM) NAIL INTERTROCHANTRIC;  Surgeon: Christena FlakeJohn J Poggi, MD;  Location: [facility];  Service: Orthopedics;  Laterality: Right;   INTRAMEDULLARY (IM) NAIL INTERTROCHANTERIC Left 01/14/2021   Procedure: INTRAMEDULLARY (IM) NAIL INTERTROCHANTRIC;  Surgeon: Lyndle HerrlichBowers, James R, MD;  Location: [facility];  Service: Orthopedics;  Laterality: Left;   KYPHOPLASTY N/A 08/20/2018   Procedure: [id number];  Surgeon: Kennedy BuckerMenz, Michael, MD;  Location: [facility];  Service: Orthopedics;  Laterality: N/A;   LOWER EXTREMITY ANGIOGRAPHY Right 12/18/2019   Procedure: LOWER EXTREMITY ANGIOGRAPHY;  Surgeon: Annice Needyew, Jason S, MD;  Location: [facility];  Service: Cardiovascular;  Laterality: Right;   LOWER EXTREMITY ANGIOGRAPHY Right 12/02/2020   Procedure: LOWER EXTREMITY ANGIOGRAPHY;  Surgeon: Annice Needyew, Jason S, MD;  Location: [facility];  Service: Cardiovascular;  Laterality: Right;   LOWER EXTREMITY ANGIOGRAPHY Right 12/23/2020   Procedure: Lower Extremity Angiography;  Surgeon: Annice Needyew, Jason S, MD;  Location: [facility];  Service: Cardiovascular;  Laterality: Right;   LOWER EXTREMITY ANGIOGRAPHY Right 12/24/2020   Procedure: Lower Extremity Angiography;  Surgeon: Annice Needyew, Jason S, MD;  Location: [facility];  Service: Cardiovascular;  Laterality: Right;   LOWER EXTREMITY ANGIOGRAPHY Right 10/20/2021   Procedure: Lower Extremity Angiography;  Surgeon: Annice Needyew, Jason S, MD;  Location: [facility];  Service: Cardiovascular;  Laterality: Right;   LOWER EXTREMITY ANGIOGRAPHY Right 07/03/2022   Procedure: Lower Extremity  Angiography;  Surgeon: Annice Needyew, Jason S, MD;  Location: [facility];  Service: Cardiovascular;  Laterality: Right;   NECK SURGERY     NOSE SURGERY     ROTATOR CUFF REPAIR     x2   SHOULDER ARTHROSCOPY  12/07/2011   Procedure: ARTHROSCOPY SHOULDER;  Surgeon: Loreta Aveaniel F Murphy, MD;  Location: [facility];  Service: Orthopedics;  Laterality: Right;  Debridement Partial Cuff Tear, Release Coracoacromial Ligament   SHOULDER SURGERY  12/07/2011   right   Family History:  Family History  Problem Relation Age of Onset   Alcoholism Brother    Aneurysm Other    Family Psychiatric  History: see above Social History:  Social History   Substance and Sexual Activity  Alcohol Use No     Social History   Substance and Sexual Activity  Drug Use No    Social History   Socioeconomic History   Marital status: Divorced    Spouse name: Not on file   Number of children:  Not on file   Years of education: Not on file   Highest education level: Not on file  Occupational History   Occupation: retired Arboriculturist  Tobacco Use   Smoking status: Some Days    Packs/day: 0    Types: Cigarettes   Smokeless tobacco: Never   Tobacco comments:    pt states 2 cigs per month   Substance and Sexual Activity   Alcohol use: No   Drug use: No   Sexual activity: Never  Other Topics Concern   Not on file  Social History Narrative   Not on file   Social Determinants of Health   Financial Resource Strain: Not on file  Food Insecurity: No Food Insecurity (02/02/2023)   Hunger Vital Sign    Worried About Running Out of Food in the Last Year: Never true    Ran Out of Food in the Last Year: Never true  Transportation Needs: No Transportation Needs (02/02/2023)   PRAPARE - Administrator, Civil Service (Medical): No    Lack of Transportation (Non-Medical): No  Physical Activity: Not on file  Stress: Not on file  Social Connections: Not on file   Additional Social  History:    Allergies:  No Known Allergies  Labs:  Results for orders placed or performed during the hospital encounter of 02/24/23 (from the past 48 hour(s))  Culture, blood (routine x 2)     Status: None (Preliminary result)   Collection Time: 02/24/23 11:05 AM   Specimen: BLOOD  Result Value Ref Range   Specimen Description BLOOD BLOOD RIGHT ARM    Special Requests      BOTTLES DRAWN AEROBIC AND ANAEROBIC Blood Culture results may not be optimal due to an excessive volume of blood received in culture bottles   Culture      NO GROWTH < 24 HOURS Performed at [facility], [address] [place], [place] [address]    Report Status PENDING   Comprehensive metabolic panel     Status: Abnormal   Collection Time: 02/24/23 11:06 AM  Result Value Ref Range   Sodium 142 135 - 145 mmol/L   Potassium 4.1 3.5 - 5.1 mmol/L   Chloride 102 98 - 111 mmol/L   CO2 21 (L) 22 - 32 mmol/L   Glucose, Bld 90 70 - 99 mg/dL    Comment: Glucose reference range applies only to samples taken after fasting for at least 8 hours.   BUN 20 8 - 23 mg/dL   Creatinine, Ser 4.19 0.44 - 1.00 mg/dL   Calcium 8.9 8.9 - 62.2 mg/dL   Total Protein 7.1 6.5 - 8.1 g/dL   Albumin 3.7 3.5 - 5.0 g/dL   AST 21 15 - 41 U/L   ALT 17 0 - 44 U/L   Alkaline Phosphatase 83 38 - 126 U/L   Total Bilirubin 1.7 (H) 0.3 - 1.2 mg/dL   GFR, Estimated >29 >79 mL/min    Comment: (NOTE) Calculated using the CKD-EPI Creatinine Equation (2021)    Anion gap 19 (H) 5 - 15    Comment: Performed at [facility], [address] [place], [place] [address]  CBC with Differential     Status: Abnormal   Collection Time: 02/24/23 11:06 AM  Result Value Ref Range   WBC 9.4 4.0 - 10.5 K/uL   RBC 3.86 (L) 3.87 - 5.11 MIL/uL   Hemoglobin 10.4 (L) 12.0 - 15.0 g/dL   HCT 94.1 (L) 74.0 -  46.0 %   MCV 90.2 80.0 - 100.0 fL   MCH 26.9 26.0 - 34.0 pg   MCHC 29.9 (L) 30.0 - 36.0 g/dL   RDW 16.1 09.6 - 04.5 %   Platelets 298  150 - 400 K/uL   nRBC 0.0 0.0 - 0.2 %   Neutrophils Relative % 93 %   Neutro Abs 8.8 (H) 1.7 - 7.7 K/uL   Lymphocytes Relative 4 %   Lymphs Abs 0.4 (L) 0.7 - 4.0 K/uL   Monocytes Relative 3 %   Monocytes Absolute 0.2 0.1 - 1.0 K/uL   Eosinophils Relative 0 %   Eosinophils Absolute 0.0 0.0 - 0.5 K/uL   Basophils Relative 0 %   Basophils Absolute 0.0 0.0 - 0.1 K/uL   Immature Granulocytes 0 %   Abs Immature Granulocytes 0.04 0.00 - 0.07 K/uL    Comment: Performed at [facility], [address] [place], [place] [address]  Troponin I (High Sensitivity)     Status: Abnormal   Collection Time: 02/24/23 11:06 AM  Result Value Ref Range   Troponin I (High Sensitivity) 18 (H) <18 ng/L    Comment: (NOTE) Elevated high sensitivity troponin I (hsTnI) values and significant  changes across serial measurements may suggest ACS but many other  chronic and acute conditions are known to elevate hsTnI results.  Refer to the "Links" section for chest pain algorithms and additional  guidance. Performed at [facility], [address] [place], [place] [address]   Brain natriuretic peptide     Status: None   Collection Time: 02/24/23 11:06 AM  Result Value Ref Range   B Natriuretic Peptide 51.2 0.0 - 100.0 pg/mL    Comment: Performed at [facility], [address] [place], [place] [address]  Lactic acid, plasma     Status: None   Collection Time: 02/24/23 11:06 AM  Result Value Ref Range   Lactic Acid, Venous 1.6 0.5 - 1.9 mmol/L    Comment: Performed at [facility], [address] [place], [place] [address]  Procalcitonin     Status: None   Collection Time: 02/24/23 11:06 AM  Result Value Ref Range   Procalcitonin <0.10 ng/mL    Comment:        Interpretation: PCT (Procalcitonin) <= 0.5 ng/mL: Systemic infection (sepsis) is not likely. Local bacterial infection is possible. (NOTE)       Sepsis PCT Algorithm           Lower Respiratory Tract                                       Infection PCT Algorithm    ----------------------------     ----------------------------         PCT < 0.25 ng/mL                PCT < 0.10 ng/mL          Strongly encourage             Strongly discourage   discontinuation of antibiotics    initiation of antibiotics    ----------------------------     -----------------------------       PCT 0.25 - 0.50 ng/mL            PCT 0.10 - 0.25 ng/mL               OR       >  80% decrease in PCT            Discourage initiation of                                            antibiotics      Encourage discontinuation           of antibiotics    ----------------------------     -----------------------------         PCT >= 0.50 ng/mL              PCT 0.26 - 0.50 ng/mL               AND        <80% decrease in PCT             Encourage initiation of                                             antibiotics       Encourage continuation           of antibiotics    ----------------------------     -----------------------------        PCT >= 0.50 ng/mL                  PCT > 0.50 ng/mL               AND         increase in PCT                  Strongly encourage                                      initiation of antibiotics    Strongly encourage escalation           of antibiotics                                     -----------------------------                                           PCT <= 0.25 ng/mL                                                 OR                                        > 80% decrease in PCT                                      Discontinue / Do not initiate  antibiotics  Performed at [facility], [address] [place], [place] [address]   Culture, blood (routine x 2)     Status: None (Preliminary result)   Collection Time: 02/24/23 11:06 AM   Specimen: BLOOD  Result Value Ref Range   Specimen Description BLOOD RIGHT ANTECUBITAL     Special Requests      BOTTLES DRAWN AEROBIC AND ANAEROBIC Blood Culture results may not be optimal due to an inadequate volume of blood received in culture bottles   Culture      NO GROWTH < 24 HOURS Performed at [facility], [address] [place], [place] [address]    Report Status PENDING   Resp panel by RT-PCR (RSV, Flu A&B, Covid) Anterior Nasal Swab     Status: None   Collection Time: 02/24/23 12:07 PM   Specimen: Anterior Nasal Swab  Result Value Ref Range   SARS Coronavirus 2 by RT PCR NEGATIVE NEGATIVE    Comment: (NOTE) SARS-CoV-2 target nucleic acids are NOT DETECTED.  The SARS-CoV-2 RNA is generally detectable in upper respiratory specimens during the acute phase of infection. The lowest concentration of SARS-CoV-2 viral copies this assay can detect is 138 copies/mL. A negative result does not preclude SARS-Cov-2 infection and should not be used as the sole basis for treatment or other patient management decisions. A negative result may occur with  improper specimen collection/handling, submission of specimen other than nasopharyngeal swab, presence of viral mutation(s) within the areas targeted by this assay, and inadequate number of viral copies(<138 copies/mL). A negative result must be combined with clinical observations, patient history, and epidemiological information. The expected result is Negative.  Fact Sheet for Patients:  BloggerCourse.com  Fact Sheet for Healthcare Providers:  SeriousBroker.it  This test is no t yet approved or cleared by the [place] FDA and  has been authorized for detection and/or diagnosis of SARS-CoV-2 by FDA under an Emergency Use Authorization (EUA). This EUA will remain  in effect (meaning this test can be used) for the duration of the COVID-19 declaration under Section 564(b)(1) of the Act, 21 U.S.C.section 360bbb-3(b)(1), unless the authorization is terminated   or revoked sooner.       Influenza A by PCR NEGATIVE NEGATIVE   Influenza B by PCR NEGATIVE NEGATIVE    Comment: (NOTE) The Xpert Xpress SARS-CoV-2/FLU/RSV plus assay is intended as an aid in the diagnosis of influenza from Nasopharyngeal swab specimens and should not be used as a sole basis for treatment. Nasal washings and aspirates are unacceptable for Xpert Xpress SARS-CoV-2/FLU/RSV testing.  Fact Sheet for Patients: BloggerCourse.com  Fact Sheet for Healthcare Providers: SeriousBroker.it  This test is not yet approved or cleared by the [place] FDA and has been authorized for detection and/or diagnosis of SARS-CoV-2 by FDA under an Emergency Use Authorization (EUA). This EUA will remain in effect (meaning this test can be used) for the duration of the COVID-19 declaration under Section 564(b)(1) of the Act, 21 U.S.C. section 360bbb-3(b)(1), unless the authorization is terminated or revoked.     Resp Syncytial Virus by PCR NEGATIVE NEGATIVE    Comment: (NOTE) Fact Sheet for Patients: BloggerCourse.com  Fact Sheet for Healthcare Providers: SeriousBroker.it  This test is not yet approved or cleared by the [place] FDA and has been authorized for detection and/or diagnosis of SARS-CoV-2 by FDA under an Emergency Use Authorization (EUA). This EUA will remain in effect (meaning this test can be used) for the duration of the COVID-19 declaration  under Section 564(b)(1) of the Act, 21 U.S.C. section 360bbb-3(b)(1), unless the authorization is terminated or revoked.  Performed at [facility], [address] [place], [place] [address]   Blood gas, venous     Status: Abnormal   Collection Time: 02/24/23 12:07 PM  Result Value Ref Range   pH, Ven 7.27 7.25 - 7.43   pCO2, Ven 44 44 - 60 mmHg   pO2, Ven 36 32 - 45 mmHg   Bicarbonate 20.2 20.0 - 28.0  mmol/L   Acid-base deficit 6.6 (H) 0.0 - 2.0 mmol/L   O2 Saturation 51.7 %   Patient temperature 37.0    Collection site VENOUS     Comment: Performed at [facility], [address] [place], [place] [address]  Urinalysis, Routine w reflex microscopic -Urine, Clean Catch     Status: Abnormal   Collection Time: 02/24/23 12:51 PM  Result Value Ref Range   Color, Urine YELLOW (A) YELLOW   APPearance HAZY (A) CLEAR   Specific Gravity, Urine 1.020 1.005 - 1.030   pH 5.0 5.0 - 8.0   Glucose, UA NEGATIVE NEGATIVE mg/dL   Hgb urine dipstick NEGATIVE NEGATIVE   Bilirubin Urine NEGATIVE NEGATIVE   Ketones, ur 80 (A) NEGATIVE mg/dL   Protein, ur 30 (A) NEGATIVE mg/dL   Nitrite NEGATIVE NEGATIVE   Leukocytes,Ua NEGATIVE NEGATIVE   RBC / HPF 0-5 0 - 5 RBC/hpf   WBC, UA 0-5 0 - 5 WBC/hpf   Bacteria, UA NONE SEEN NONE SEEN   Squamous Epithelial / HPF 0-5 0 - 5 /HPF   Mucus PRESENT    Hyaline Casts, UA PRESENT     Comment: Performed at [facility], [address], [place], [place] [address]  Urine Drug Screen, Qualitative ([facility] only)     Status: Abnormal   Collection Time: 02/24/23 12:51 PM  Result Value Ref Range   Tricyclic, Ur Screen NONE DETECTED NONE DETECTED   Amphetamines, Ur Screen NONE DETECTED NONE DETECTED   MDMA (Ecstasy)Ur Screen NONE DETECTED NONE DETECTED   Cocaine Metabolite,Ur [place] NONE DETECTED NONE DETECTED   Opiate, Ur Screen NONE DETECTED NONE DETECTED   Phencyclidine (PCP) Ur S NONE DETECTED NONE DETECTED   Cannabinoid 50 Ng, Ur [place] NONE DETECTED NONE DETECTED   Barbiturates, Ur Screen NONE DETECTED NONE DETECTED   Benzodiazepine, Ur Scrn POSITIVE (A) NONE DETECTED   Methadone Scn, Ur NONE DETECTED NONE DETECTED    Comment: (NOTE) Tricyclics + metabolites, urine    Cutoff 1000 ng/mL Amphetamines + metabolites, urine  Cutoff 1000 ng/mL MDMA (Ecstasy), urine              Cutoff 500 ng/mL Cocaine Metabolite, urine          Cutoff 300 ng/mL Opiate +  metabolites, urine        Cutoff 300 ng/mL Phencyclidine (PCP), urine         Cutoff 25 ng/mL Cannabinoid, urine                 Cutoff 50 ng/mL Barbiturates + metabolites, urine  Cutoff 200 ng/mL Benzodiazepine, urine              Cutoff 200 ng/mL Methadone, urine                   Cutoff 300 ng/mL  The urine drug screen provides only a preliminary, unconfirmed analytical test result and should not be used for non-medical purposes. Clinical consideration and professional judgment  should be applied to any positive drug screen result due to possible interfering substances. A more specific alternate chemical method must be used in order to obtain a confirmed analytical result. Gas chromatography / mass spectrometry (GC/MS) is the preferred confirm atory method. Performed at [facility], [address] [place], [place] [address]   Troponin I (High Sensitivity)     Status: Abnormal   Collection Time: 02/24/23  1:04 PM  Result Value Ref Range   Troponin I (High Sensitivity) 18 (H) <18 ng/L    Comment: (NOTE) Elevated high sensitivity troponin I (hsTnI) values and significant  changes across serial measurements may suggest ACS but many other  chronic and acute conditions are known to elevate hsTnI results.  Refer to the "Links" section for chest pain algorithms and additional  guidance. Performed at [facility], [address] [place], [place] [address]   Basic metabolic panel     Status: Abnormal   Collection Time: 02/25/23  6:59 AM  Result Value Ref Range   Sodium 139 135 - 145 mmol/L   Potassium 3.3 (L) 3.5 - 5.1 mmol/L   Chloride 108 98 - 111 mmol/L   CO2 21 (L) 22 - 32 mmol/L   Glucose, Bld 143 (H) 70 - 99 mg/dL    Comment: Glucose reference range applies only to samples taken after fasting for at least 8 hours.   BUN 14 8 - 23 mg/dL   Creatinine, Ser 1.47 0.44 - 1.00 mg/dL   Calcium 8.1 (L) 8.9 - 10.3 mg/dL   GFR, Estimated >82 >95 mL/min     Comment: (NOTE) Calculated using the CKD-EPI Creatinine Equation (2021)    Anion gap 10 5 - 15    Comment: Performed at [facility], [address] [place], [place] [address]  CBC     Status: Abnormal   Collection Time: 02/25/23  6:59 AM  Result Value Ref Range   WBC 8.0 4.0 - 10.5 K/uL   RBC 3.37 (L) 3.87 - 5.11 MIL/uL   Hemoglobin 9.3 (L) 12.0 - 15.0 g/dL   HCT 86.5 (L) 78.4 - 69.6 %   MCV 89.0 80.0 - 100.0 fL   MCH 27.6 26.0 - 34.0 pg   MCHC 31.0 30.0 - 36.0 g/dL   RDW 29.5 (H) 28.4 - 13.2 %   Platelets 216 150 - 400 K/uL   nRBC 0.0 0.0 - 0.2 %    Comment: Performed at [facility], [address] [place], [place] [address]    Current Facility-Administered Medications  Medication Dose Route Frequency Provider Last Rate Last Admin   acetaminophen (TYLENOL) tablet 650 mg  650 mg Oral Q6H PRN Lorretta Harp, MD   650 mg at 02/25/23 1005   albuterol (PROVENTIL) (2.5 MG/3ML) 0.083% nebulizer solution 2.5 mg  2.5 mg Nebulization Q4H PRN Lorretta Harp, MD       amLODipine (NORVASC) tablet 5 mg  5 mg Oral Daily Lorretta Harp, MD   5 mg at 02/25/23 1006   aspirin EC tablet 81 mg  81 mg Oral Daily Lorretta Harp, MD   81 mg at 02/25/23 1006   atorvastatin (LIPITOR) tablet 10 mg  10 mg Oral Daily Lorretta Harp, MD   10 mg at 02/25/23 1006   azithromycin (ZITHROMAX) tablet 250 mg  250 mg Oral Daily Lorretta Harp, MD   250 mg at 02/25/23 1006   dextromethorphan-guaiFENesin (MUCINEX DM) 30-600 MG per 12 hr tablet 1 tablet  1 tablet Oral BID  PRN Lorretta Harp, MD       enoxaparin (LOVENOX) injection 40 mg  40 mg Subcutaneous Q24H Lorretta Harp, MD   40 mg at 02/24/23 2111   feeding supplement (ENSURE ENLIVE / ENSURE PLUS) liquid 237 mL  237 mL Oral TID BM Lorretta Harp, MD   237 mL at 02/25/23 1028   hydrALAZINE (APRESOLINE) injection 5 mg  5 mg Intravenous Q2H PRN Lorretta Harp, MD   5 mg at 02/24/23 1530   ipratropium-albuterol (DUONEB) 0.5-2.5 (3) MG/3ML nebulizer solution 3 mL  3 mL Nebulization Q4H  Lorretta Harp, MD   3 mL at 02/25/23 0736   levothyroxine (SYNTHROID) tablet 50 mcg  50 mcg Oral q morning Lorretta Harp, MD   50 mcg at 02/25/23 1610   methylPREDNISolone sodium succinate (SOLU-MEDROL) 40 mg/mL injection 40 mg  40 mg Intravenous Q12H Lorretta Harp, MD   40 mg at 02/25/23 1007   metoprolol tartrate (LOPRESSOR) injection 5 mg  5 mg Intravenous Q2H PRN Lorretta Harp, MD   5 mg at 02/24/23 1538   metoprolol tartrate (LOPRESSOR) tablet 50 mg  50 mg Oral BID Lorretta Harp, MD   50 mg at 02/25/23 1006   mometasone-formoterol (DULERA) 200-5 MCG/ACT inhaler 2 puff  2 puff Inhalation BID Lorretta Harp, MD   2 puff at 02/25/23 1030   montelukast (SINGULAIR) tablet 10 mg  10 mg Oral QHS Lorretta Harp, MD   10 mg at 02/24/23 2109   multivitamin with minerals tablet 1 tablet  1 tablet Oral Daily Lorretta Harp, MD   1 tablet at 02/25/23 1005   nicotine (NICODERM CQ - dosed in mg/24 hours) patch 21 mg  21 mg Transdermal Daily Lorretta Harp, MD   21 mg at 02/25/23 1007   ondansetron (ZOFRAN) injection 4 mg  4 mg Intravenous Q8H PRN Lorretta Harp, MD       pantoprazole (PROTONIX) EC tablet 40 mg  40 mg Oral Daily Lorretta Harp, MD   40 mg at 02/25/23 1006   risperiDONE (RISPERDAL) tablet 0.25 mg  0.25 mg Oral QHS Charm Rings, NP       thiamine (VITAMIN B1) tablet 100 mg  100 mg Oral Daily Lorretta Harp, MD   100 mg at 02/25/23 1006   traZODone (DESYREL) tablet 300 mg  300 mg Oral QHS Lorretta Harp, MD   300 mg at 02/24/23 2109    Musculoskeletal: Strength & Muscle Tone: decreased Gait & Station:  did not witness Patient leans: N/A  Psychiatric Specialty Exam: Physical Exam Vitals and nursing note reviewed.  HENT:     Head: Normocephalic.  Pulmonary:     Effort: Pulmonary effort is normal.  Musculoskeletal:     Cervical back: Normal range of motion.  Neurological:     General: No focal deficit present.     Mental Status: She is alert and oriented to person, place, and time.  Psychiatric:        Attention and  Perception: Attention and perception normal.        Mood and Affect: Mood is anxious.        Speech: Speech normal.        Behavior: Behavior normal. Behavior is cooperative.        Thought Content: Thought content normal.        Cognition and Memory: Cognition is impaired.        Judgment: Judgment normal.     Review of Systems  Psychiatric/Behavioral:  The patient is nervous/anxious.  All other systems reviewed and are negative.   Blood pressure (!) 147/86, pulse (!) 116, temperature 97.9 F (36.6 C), temperature source Oral, resp. rate 20, weight 47.9 kg, SpO2 100 %.Body mass index is 18.13 kg/m.  General Appearance: Casual  Eye Contact:  Good  Speech:  Normal Rate  Volume:  Normal  Mood:  Anxious  Affect:  Congruent  Thought Process:  visual hallucinations at times  Orientation:  Full (Time, Place, and Person)  Thought Content:  Logical  Suicidal Thoughts:  No  Homicidal Thoughts:  No  Memory:  Immediate;   Fair Recent;   Fair Remote;   Fair  Judgement:  Good  Insight:  Fair  Psychomotor Activity:  Decreased  Concentration:  Concentration: Fair and Attention Span: Fair  Recall:  [employer] of Knowledge:  Good  Language:  Good  Akathisia:  No  Handed:  Right  AIMS (if indicated):     Assets:  Housing Leisure Time Resilience Social Support  ADL's:  Impaired  Cognition:  WNL  Sleep:        Physical Exam: Physical Exam Vitals and nursing note reviewed.  HENT:     Head: Normocephalic.  Pulmonary:     Effort: Pulmonary effort is normal.  Musculoskeletal:     Cervical back: Normal range of motion.  Neurological:     General: No focal deficit present.     Mental Status: She is alert and oriented to person, place, and time.  Psychiatric:        Attention and Perception: Attention and perception normal.        Mood and Affect: Mood is anxious.        Speech: Speech normal.        Behavior: Behavior normal. Behavior is cooperative.        Thought Content:  Thought content normal.        Cognition and Memory: Cognition is impaired.        Judgment: Judgment normal.    Review of Systems  Psychiatric/Behavioral:  The patient is nervous/anxious.   All other systems reviewed and are negative.  Blood pressure (!) 147/86, pulse (!) 116, temperature 97.9 F (36.6 C), temperature source Oral, resp. rate 20, weight 47.9 kg, SpO2 100 %. Body mass index is 18.13 kg/m.  Treatment Plan Summary: Delirium: Risperdal 0.25 mg daily at bedtime while in patient until delirium resolved  Disposition: No evidence of imminent risk to self or others at present.   Patient does not meet criteria for psychiatric inpatient admission. Supportive therapy provided about ongoing stressors.  Nanine Means, NP 02/25/2023 10:52 AM

## 2023-02-25 NOTE — Progress Notes (Signed)
Progress Note    Kyleen Thien  FAO:[id number] DOB: May 07, 1946  DOA: 02/24/2023 PCP: [name], MD      Brief Narrative:    Medical records reviewed and are as summarized below:  Lenise Arena is a 77 y.o. female with medical history significant of  COPD (on 2-3L oxygen), hypertension, hyperlipidemia, PVD, CAD, hypothyroidism, GERD, depression with anxiety, anemia, former smoker, medication noncompliance     No notes on file      Assessment/Plan:   Principal Problem:   COPD exacerbation Active Problems:   Acute metabolic encephalopathy   CAD (coronary artery disease)   Myocardial injury   Essential hypertension   HLD (hyperlipidemia)   Hypothyroidism   Tobacco use disorder   Anxiety and depression   Protein-calorie malnutrition, severe   Sinus tachycardia   Acute delirium    Body mass index is 18.13 kg/m.   COPD exacerbation: Continue antibiotics, steroids and bronchodilators   Chronic hypoxic respiratory failure:   Delirium, acute metabolic encephalopathy: She has been evaluated by the psychiatrist.  She has been started on Risperdal nightly.     Other comorbidities include tobacco use disorder, severe protein calorie malnutrition, depression, anxiety, hypertension, hyperlipidemia, CAD, PVD, esophageal dysmotility, GERD      Diet Order             Diet Heart Fluid consistency: Thin  Diet effective now                            Consultants: Psychiatrist  Procedures: None    Medications:    amLODipine  5 mg Oral Daily   aspirin EC  81 mg Oral Daily   atorvastatin  10 mg Oral Daily   azithromycin  250 mg Oral Daily   enoxaparin (LOVENOX) injection  40 mg Subcutaneous Q24H   feeding supplement  237 mL Oral TID BM   ipratropium-albuterol  3 mL Nebulization TID   levothyroxine  50 mcg Oral q morning   methylPREDNISolone (SOLU-MEDROL) injection  40 mg Intravenous Q12H   metoprolol tartrate  50 mg Oral BID    mometasone-formoterol  2 puff Inhalation BID   montelukast  10 mg Oral QHS   multivitamin with minerals  1 tablet Oral Daily   nicotine  21 mg Transdermal Daily   pantoprazole  40 mg Oral Daily   risperiDONE  0.25 mg Oral QHS   thiamine  100 mg Oral Daily   traZODone  300 mg Oral QHS   Continuous Infusions:   Anti-infectives (From admission, onward)    Start     Dose/Rate Route Frequency Ordered Stop   02/25/23 1000  azithromycin (ZITHROMAX) tablet 250 mg       See Hyperspace for full Linked Orders Report.   250 mg Oral Daily 02/24/23 1439 03/01/23 0959   02/24/23 1445  azithromycin (ZITHROMAX) tablet 500 mg       See Hyperspace for full Linked Orders Report.   500 mg Oral Daily 02/24/23 1439 02/24/23 1543              Family Communication/Anticipated D/C date and plan/Code Status   DVT prophylaxis: enoxaparin (LOVENOX) injection 40 mg Start: 02/24/23 2200     Code Status: DNR  Family Communication: None Disposition Plan: Plan to discharge home in 1 to 2 days   Status is: Inpatient Remains inpatient appropriate because: COPD exacerbation       Subjective:   Interval  events noted.  Breathing is a little better today.  Objective:    Vitals:   02/25/23 0500 02/25/23 0806 02/25/23 1300 02/25/23 1359  BP:  (!) 147/86 (!) 156/78   Pulse:  (!) 116 100   Resp:  20 (!) 22   Temp:  97.9 F (36.6 C) 98.2 F (36.8 C)   TempSrc:  Oral Oral   SpO2:  100% 96% 98%  Weight: 47.9 kg      No data found.   Intake/Output Summary (Last 24 hours) at 02/25/2023 1453 Last data filed at 02/25/2023 0300 Gross per 24 hour  Intake --  Output 150 ml  Net -150 ml   Filed Weights   02/24/23 1137 02/25/23 0500  Weight: 47.2 kg 47.9 kg    Exam:  GEN: NAD SKIN: Warm and dry EYES: No pallor or icterus ENT: MMM CV: RRR PULM: Bilateral rhonchi, no rales ABD: soft, ND, NT, +BS CNS: AAO x 3, non focal EXT: No edema or tenderness        Data Reviewed:   I  have personally reviewed following labs and imaging studies:  Labs: Labs show the following:   Basic Metabolic Panel: Recent Labs  Lab 02/24/23 1106 02/25/23 0659  NA 142 139  K 4.1 3.3*  CL 102 108  CO2 21* 21*  GLUCOSE 90 143*  BUN 20 14  CREATININE 0.80 0.56  CALCIUM 8.9 8.1*   GFR Estimated Creatinine Clearance: 45.9 mL/min (by C-G formula based on SCr of 0.56 mg/dL). Liver Function Tests: Recent Labs  Lab 02/24/23 1106  AST 21  ALT 17  ALKPHOS 83  BILITOT 1.7*  PROT 7.1  ALBUMIN 3.7   No results for input(s): "LIPASE", "AMYLASE" in the last 168 hours. No results for input(s): "AMMONIA" in the last 168 hours. Coagulation profile No results for input(s): "INR", "PROTIME" in the last 168 hours.  CBC: Recent Labs  Lab 02/24/23 1106 02/25/23 0659  WBC 9.4 8.0  NEUTROABS 8.8*  --   HGB 10.4* 9.3*  HCT 34.8* 30.0*  MCV 90.2 89.0  PLT 298 216   Cardiac Enzymes: No results for input(s): "CKTOTAL", "CKMB", "CKMBINDEX", "TROPONINI" in the last 168 hours. BNP (last 3 results) No results for input(s): "PROBNP" in the last 8760 hours. CBG: No results for input(s): "GLUCAP" in the last 168 hours. D-Dimer: No results for input(s): "DDIMER" in the last 72 hours. Hgb A1c: No results for input(s): "HGBA1C" in the last 72 hours. Lipid Profile: No results for input(s): "CHOL", "HDL", "LDLCALC", "TRIG", "CHOLHDL", "LDLDIRECT" in the last 72 hours. Thyroid function studies: No results for input(s): "TSH", "T4TOTAL", "T3FREE", "THYROIDAB" in the last 72 hours.  Invalid input(s): "FREET3" Anemia work up: No results for input(s): "VITAMINB12", "FOLATE", "FERRITIN", "TIBC", "IRON", "RETICCTPCT" in the last 72 hours. Sepsis Labs: Recent Labs  Lab 02/24/23 1106 02/25/23 0659  PROCALCITON <0.10  --   WBC 9.4 8.0  LATICACIDVEN 1.6  --     Microbiology Recent Results (from the past 240 hour(s))  Culture, blood (routine x 2)     Status: None (Preliminary result)    Collection Time: 02/24/23 11:05 AM   Specimen: BLOOD  Result Value Ref Range Status   Specimen Description BLOOD BLOOD RIGHT ARM  Final   Special Requests   Final    BOTTLES DRAWN AEROBIC AND ANAEROBIC Blood Culture results may not be optimal due to an excessive volume of blood received in culture bottles   Culture   Final  NO GROWTH < 24 HOURS Performed at [facility], [address] [place], [place] [address]    Report Status PENDING  Incomplete  Culture, blood (routine x 2)     Status: None (Preliminary result)   Collection Time: 02/24/23 11:06 AM   Specimen: BLOOD  Result Value Ref Range Status   Specimen Description BLOOD RIGHT ANTECUBITAL  Final   Special Requests   Final    BOTTLES DRAWN AEROBIC AND ANAEROBIC Blood Culture results may not be optimal due to an inadequate volume of blood received in culture bottles   Culture   Final    NO GROWTH < 24 HOURS Performed at [facility], [address] [place], [place] [address]    Report Status PENDING  Incomplete  Resp panel by RT-PCR (RSV, Flu A&B, Covid) Anterior Nasal Swab     Status: None   Collection Time: 02/24/23 12:07 PM   Specimen: Anterior Nasal Swab  Result Value Ref Range Status   SARS Coronavirus 2 by RT PCR NEGATIVE NEGATIVE Final    Comment: (NOTE) SARS-CoV-2 target nucleic acids are NOT DETECTED.  The SARS-CoV-2 RNA is generally detectable in upper respiratory specimens during the acute phase of infection. The lowest concentration of SARS-CoV-2 viral copies this assay can detect is 138 copies/mL. A negative result does not preclude SARS-Cov-2 infection and should not be used as the sole basis for treatment or other patient management decisions. A negative result may occur with  improper specimen collection/handling, submission of specimen other than nasopharyngeal swab, presence of viral mutation(s) within the areas targeted by this assay, and inadequate number of viral copies(<138  copies/mL). A negative result must be combined with clinical observations, patient history, and epidemiological information. The expected result is Negative.  Fact Sheet for Patients:  BloggerCourse.comhttps://www.fda.gov/media/152166/download  Fact Sheet for Healthcare Providers:  SeriousBroker.ithttps://www.fda.gov/media/152162/download  This test is no t yet approved or cleared by the [place] FDA and  has been authorized for detection and/or diagnosis of SARS-CoV-2 by FDA under an Emergency Use Authorization (EUA). This EUA will remain  in effect (meaning this test can be used) for the duration of the COVID-19 declaration under Section 564(b)(1) of the Act, 21 U.S.C.section 360bbb-3(b)(1), unless the authorization is terminated  or revoked sooner.       Influenza A by PCR NEGATIVE NEGATIVE Final   Influenza B by PCR NEGATIVE NEGATIVE Final    Comment: (NOTE) The Xpert Xpress SARS-CoV-2/FLU/RSV plus assay is intended as an aid in the diagnosis of influenza from Nasopharyngeal swab specimens and should not be used as a sole basis for treatment. Nasal washings and aspirates are unacceptable for Xpert Xpress SARS-CoV-2/FLU/RSV testing.  Fact Sheet for Patients: BloggerCourse.comhttps://www.fda.gov/media/152166/download  Fact Sheet for Healthcare Providers: SeriousBroker.ithttps://www.fda.gov/media/152162/download  This test is not yet approved or cleared by the [place] FDA and has been authorized for detection and/or diagnosis of SARS-CoV-2 by FDA under an Emergency Use Authorization (EUA). This EUA will remain in effect (meaning this test can be used) for the duration of the COVID-19 declaration under Section 564(b)(1) of the Act, 21 U.S.C. section 360bbb-3(b)(1), unless the authorization is terminated or revoked.     Resp Syncytial Virus by PCR NEGATIVE NEGATIVE Final    Comment: (NOTE) Fact Sheet for Patients: BloggerCourse.comhttps://www.fda.gov/media/152166/download  Fact Sheet for Healthcare  Providers: SeriousBroker.ithttps://www.fda.gov/media/152162/download  This test is not yet approved or cleared by the [place] FDA and has been authorized for detection and/or diagnosis of SARS-CoV-2 by FDA under an Emergency Use Authorization (  EUA). This EUA will remain in effect (meaning this test can be used) for the duration of the COVID-19 declaration under Section 564(b)(1) of the Act, 21 U.S.C. section 360bbb-3(b)(1), unless the authorization is terminated or revoked.  Performed at [facility], [address] [place], [place] [address]     Procedures and diagnostic studies:  CT Head Wo Contrast  Result Date: 02/24/2023 CLINICAL DATA:  Mental status change, unknown cause EXAM: CT HEAD WITHOUT CONTRAST TECHNIQUE: Contiguous axial images were obtained from the base of the skull through the vertex without intravenous contrast. RADIATION DOSE REDUCTION: This exam was performed according to the departmental dose-optimization program which includes automated exposure control, adjustment of the mA and/or kV according to patient size and/or use of iterative reconstruction technique. COMPARISON:  11/08/2022 FINDINGS: Brain: No evidence of acute infarction, hemorrhage, hydrocephalus, extra-axial collection or mass lesion/mass effect. Moderate low-density changes within the periventricular and subcortical white matter most compatible with chronic microvascular ischemic change. Mild diffuse cerebral volume loss. Vascular: Atherosclerotic calcifications involving the large vessels of the skull base. No unexpected hyperdense vessel. Skull: Normal. Negative for fracture or focal lesion. Sinuses/Orbits: No acute finding. Other: None. IMPRESSION: 1. No acute intracranial findings. 2. Chronic microvascular ischemic change and cerebral volume loss. Electronically Signed   By: Duanne Guess D.O.   On: 02/24/2023 14:10   DG Chest Port 1 View  Result Date: 02/24/2023 CLINICAL DATA:  Skin fold EXAM:  PORTABLE CHEST 1 VIEW COMPARISON:  Same day chest x-ray FINDINGS: Frontal chest x-ray obtained after patient repositioning. Previously seen skin fold is no longer evident. There is no pneumothorax. No new focal airspace consolidation. Stable heart size. IMPRESSION: No pneumothorax. Electronically Signed   By: Duanne Guess D.O.   On: 02/24/2023 12:06   DG Chest Port 1 View  Result Date: 02/24/2023 CLINICAL DATA:  Shortness of breath EXAM: PORTABLE CHEST 1 VIEW COMPARISON:  01/30/2023 FINDINGS: Stable cardiomediastinal contours. Aortic atherosclerosis. No focal airspace consolidation. Presumed skin fold at the periphery of the left lung base. Chronically coarsened interstitial markings bilaterally. No focal consolidation. No pleural effusion. No right-sided pneumothorax. IMPRESSION: 1. Presumed skin fold at the periphery of the left lung base. Repeat frontal chest radiograph after patient repositioning is recommended to exclude a small left pneumothorax. 2. Chronic lung changes without focal airspace consolidation. Electronically Signed   By: Duanne Guess D.O.   On: 02/24/2023 11:37               LOS: 1 day   Venora Kautzman  [facility]   Pager on [email or web address]. If 7PM-7AM, please contact night-coverage at [email or web address]     02/25/2023, 2:53 PM

## 2023-02-26 DIAGNOSIS — J441 Chronic obstructive pulmonary disease with (acute) exacerbation: Secondary | ICD-10-CM | POA: Diagnosis not present

## 2023-02-26 LAB — IRON AND TIBC
Iron: 16 ug/dL — ABNORMAL LOW (ref 28–170)
Saturation Ratios: 5 % — ABNORMAL LOW (ref 10.4–31.8)
TIBC: 340 ug/dL (ref 250–450)
UIBC: 324 ug/dL

## 2023-02-26 LAB — EXPECTORATED SPUTUM ASSESSMENT W GRAM STAIN, RFLX TO RESP C

## 2023-02-26 LAB — VITAMIN B12: Vitamin B-12: 278 pg/mL (ref 180–914)

## 2023-02-26 LAB — MAGNESIUM: Magnesium: 1.7 mg/dL (ref 1.7–2.4)

## 2023-02-26 LAB — CULTURE, BLOOD (ROUTINE X 2): Culture: NO GROWTH

## 2023-02-26 LAB — PHOSPHORUS: Phosphorus: 2.5 mg/dL (ref 2.5–4.6)

## 2023-02-26 MED ORDER — BENZONATATE 100 MG PO CAPS
100.0000 mg | ORAL_CAPSULE | Freq: Three times a day (TID) | ORAL | Status: DC | PRN
  Administered 2023-02-27: 100 mg via ORAL
  Filled 2023-02-26: qty 1

## 2023-02-26 NOTE — Plan of Care (Signed)

## 2023-02-26 NOTE — Progress Notes (Signed)
Initial Nutrition Assessment  DOCUMENTATION CODES:   Severe malnutrition in context of chronic illness  INTERVENTION:  - Continue Ensure Enlive po TID, each supplement provides 350 kcal and 20 grams of protein.  NUTRITION DIAGNOSIS:   Severe Malnutrition related to chronic illness as evidenced by severe fat depletion, severe muscle depletion.  GOAL:   Patient will meet greater than or equal to 90% of their needs  MONITOR:   PO intake, Supplement acceptance  REASON FOR ASSESSMENT:   Consult Assessment of nutrition requirement/status  ASSESSMENT:   77 y.o. female admits related to SOB and AMS. PMH includes: anxiety, CAD, COPD, depression, GERD, HCAP, hiatal hernia, HTN. Pt is currently receiving medical management related to COPD exacerbation.  Meds reviewed: lipitor, solu-medrol, MVI, risperdal, thiamine. Labs reviewed: K low.   The pt was very confused and unable to provide nutrition hx details. Weights appear to be fairly stable per record. The pt ate 100% of her breakfast this am. Pt has Ensure TID ordered. RD will continue to closely monitor PO intakes.   NUTRITION - FOCUSED PHYSICAL EXAM:  Flowsheet Row Most Recent Value  Orbital Region Severe depletion  Upper Arm Region Severe depletion  Thoracic and Lumbar Region Severe depletion  Buccal Region Severe depletion  Temple Region Severe depletion  Clavicle Bone Region Severe depletion  Clavicle and Acromion Bone Region Severe depletion  Scapular Bone Region Severe depletion  Dorsal Hand Severe depletion  Patellar Region Severe depletion  Anterior Thigh Region Severe depletion  Posterior Calf Region Severe depletion  Edema (RD Assessment) None  Hair Reviewed  Eyes Reviewed  Mouth Reviewed  Skin Reviewed  Nails Reviewed       Diet Order:   Diet Order             Diet Heart Fluid consistency: Thin  Diet effective now                   EDUCATION NEEDS:   Not appropriate for education at this  time  Skin:  Skin Assessment: Reviewed RN Assessment  Last BM:  PTA  Height:   Ht Readings from Last 1 Encounters:  02/02/23 5\' 4"  (1.626 m)    Weight:   Wt Readings from Last 1 Encounters:  02/25/23 47.9 kg    Ideal Body Weight:     BMI:  Body mass index is 18.13 kg/m.  Estimated Nutritional Needs:   Kcal:  3710-6269 kcals  Protein:  70-85 gm  Fluid:  >/= 1.4 L  Bethann Humble, RD, LDN, CNSC.

## 2023-02-26 NOTE — TOC Initial Note (Signed)
Transition of Care [facility]) - Initial/Assessment Note    Patient Details  Name: [name] MRN: [id number] Date of Birth: 10/25/1946  Transition of Care [facility]) CM/SW Contact:    Truddie Hidden, RN Phone Number: 02/26/2023, 3:50 PM  Clinical Narrative:                 Per TOC note 3/17 patient was with Beverly Gust for  PT OT Message sent to [facility] at [facility] to confirm status.  Patient receives oxygen via [employer].        Patient Goals and CMS Choice            Expected Discharge Plan and Services                                              Prior Living Arrangements/Services                       Activities of Daily Living      Permission Sought/Granted                  Emotional Assessment              Admission diagnosis:  COPD exacerbation [J44.1] Altered mental status, unspecified altered mental status type [R41.82] Patient Active Problem List   Diagnosis Date Noted   Acute delirium 02/25/2023   Myocardial injury 02/24/2023   Acute metabolic encephalopathy 02/24/2023   Sinus tachycardia 02/24/2023   CAD (coronary artery disease) 01/30/2023   Normocytic anemia 01/30/2023   Compression fracture of body of thoracic vertebra 01/30/2023   Acute on chronic respiratory failure with hypoxemia 01/11/2023   AMS (altered mental status) 11/08/2022   Underweight 11/08/2022   Acute on chronic respiratory failure with hypoxia 06/30/2022   Osteomyelitis of fifth toe of right foot 06/30/2022   Dyslipidemia 06/30/2022   Subacute osteomyelitis of right foot    Essential hypertension    Protein-calorie malnutrition, severe 11/21/2021   COPD with acute exacerbation 11/21/2021   COPD exacerbation 11/20/2021   Chronic pain - multiple sites arthritis 11/20/2021   Benzodiazepine dependence, continuous 11/20/2021   Peripheral vascular disease 11/20/2021   Hyponatremia 11/20/2021   Hypocalcemia 11/20/2021   Hypoalbuminemia due to protein-calorie  malnutrition 11/20/2021   Anemia 11/20/2021   Acute respiratory failure with hypoxemia    Benzodiazepine withdrawal without complication    Malnutrition of moderate degree 10/20/2021   Absent pedal pulses 10/19/2021   Chronic hip pain (Left) 02/17/2021   Hip pain, acute (Left) 02/17/2021   Chronic use of opiate for therapeutic purpose 02/16/2021   Closed fracture of hip, sequela (Left) 02/07/2021   Fracture of femoral neck, left, closed 01/11/2021   Chronic respiratory failure with hypoxia 01/11/2021   Hypokalemia 01/11/2021   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 12/22/2020   PAD (peripheral artery disease) 12/22/2020   Encounter for long-term opiate analgesic use 11/11/2020   Dry gangrene (HCC) of middle toe, right foot 11/11/2020   Foot pain, right 11/11/2020   Hard of hearing 11/11/2020   Atherosclerosis of native arteries of the extremities with gangrene 12/09/2019   Vitamin D deficiency 11/12/2019   Pharmacologic therapy 06/04/2019   Disorder of skeletal system 06/04/2019   Problems influencing health status 06/04/2019   Compression fracture of L3 vertebra 08/19/2018   Chronic hip pain (Bilateral) 12/31/2017   Neurogenic  pain 08/29/2017   Chronic low back pain (1ry area of Pain) (Right) w/o sciatica 08/29/2017   Chronic sacroiliac joint pain (Right) 06/05/2017   Vitamin D insufficiency 03/12/2017   Right hip pain 02/20/2017   Intertrochanteric fracture of right hip, sequela 02/20/2017   B12 deficiency 02/05/2017   Pressure injury of skin 10/02/2016   Closed displaced intertrochanteric fracture of right femur 10/02/2016   Hip fracture 10/01/2016   Cervical facet hypertrophy (Bilateral) 05/27/2016   History of shoulder surgery 5 (Right) 05/27/2016   Lumbar foraminal stenosis (L3-4) (Left) 05/27/2016   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) 05/27/2016   Lumbar facet hypertrophy (Bilateral) 05/27/2016   Lumbar facet syndrome (Right) 05/27/2016   Lumbar grade 1  Anterolisthesis of L3 over L4 05/27/2016   Chronic shoulder pain (Bilateral) (status post multiple surgeries) (R>L) 12/08/2015   Substance use disorder Risk: High 09/14/2015   Chronic pain syndrome 09/14/2015   Cervical spondylosis 09/14/2015   Chronic neck pain (2ry area of Pain) (Bilateral) (R>L) 09/14/2015   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) 09/14/2015   Cervical facet syndrome (Bilateral) (R>L) 09/14/2015   Cervical myofascial pain syndrome 09/14/2015   Lumbar spondylosis 09/14/2015   Chronic shoulder impingement syndrome (Right) 09/14/2015   Hypomagnesemia 09/14/2015   CRP elevated 09/14/2015   Elevated sedimentation rate 09/14/2015   Chronic obstructive pulmonary disease (COPD) 09/14/2015   Nicotine dependence 09/14/2015   Chronic shoulder pain (Right) 09/14/2015   Abnormal nerve conduction studies 09/14/2015   Encounter for therapeutic drug level monitoring 09/09/2015   Long term current use of opiate analgesic 09/09/2015   Long term prescription opiate use 09/09/2015   Uncomplicated opioid dependence 09/09/2015   Opiate use 09/09/2015   Anxiety and depression 07/11/2012   Tobacco use disorder 05/27/2011   Fatigue 05/27/2011   Hypothyroidism 11/25/2010   HLD (hyperlipidemia) 08/24/2010   Tachycardia 08/24/2010   DYSPNEA 08/24/2010   PCP:  [name], MD Pharmacy:   [facility] - [place], [place] - [address] [place] [address] Phone: [phone] Fax: [phone]  [id number] - [place], [place] - [address] [place] [address] Phone: [phone] Fax: [phone]  [id number] [place], [address] - [address] AT [facility] OF [address] [place] [address] [place] [address] Phone: [phone] Fax: [phone]  [facility] [address] [place] [address] Phone: [phone] Fax:  [phone]     Social Determinants of Health (SDOH) Social History: SDOH Screenings   Food Insecurity: No Food Insecurity (02/02/2023)  Housing: Low Risk  (02/02/2023)  Transportation Needs: No Transportation Needs (02/02/2023)  Utilities: Not At Risk (02/02/2023)  Depression (PHQ2-9): Low Risk  (07/13/2021)  Tobacco Use: High Risk (02/25/2023)   SDOH Interventions:     Readmission Risk Interventions    01/31/2023   11:49 AM 07/05/2022   10:50 AM 11/25/2021    9:50 AM  Readmission Risk Prevention Plan  Transportation Screening Complete Complete Complete  Medication Review (RN Care Manager) Complete Complete Complete  PCP or Specialist appointment within 3-5 days of discharge Complete  Complete  HRI or Home Care Consult  Complete Complete  SW Recovery Care/Counseling Consult Complete Complete   Palliative Care Screening Not Applicable  Not Applicable  Skilled Nursing Facility Not Applicable Not Applicable Not Applicable

## 2023-02-26 NOTE — Progress Notes (Signed)
[facility] Progress Note  Patient: [name]    KKO:[id number]  DOA: 02/24/2023     Date of Service: the patient was seen and examined on 02/26/2023  Chief Complaint  Patient presents with   Altered Mental Status   Shortness of Breath   Brief hospital course: [name] is a 77 y.o. female with medical history significant of  COPD (on 2-3L oxygen), hypertension, hyperlipidemia, PVD, CAD, hypothyroidism, GERD, depression with anxiety, anemia, former smoker, medication noncompliance, who presents with shortness of breath and AMS.   ED workup: WBC 9.4, negative COVID PCR, negative urinalysis, GFR> 60, temperature normal, blood pressure 163/83, heart rate 143, RR 24, oxygen saturation 98% on 2 L oxygen.  Chest x-ray negative.  CT head negative.  ABG with pH 7.27, CO2 44, O2 59.7.  Patient is admitted to telemetry bed as inpatient.     Assessment and Plan:  COPD exacerbation Continue supplemental O2 inhalation Continue IV Solu-Medrol 40 every 12 hourly Continue Z-Pak Continue Dulera inhaler twice daily, DuoNeb 3 times a day scheduled PPI for GI prophylaxis   Acute metabolic encephalopathy and acute delirium CT head negative. Patient has hallucination. Potential differential diagnosis include dementia with behavior change and psychosis.  Started Risperdal 0.25 mg p.o. QHS and trazodone 300 mg nightly Seen by psychiatry  CAD, Hypertension, HLD continue amlodipine, metoprolol, aspirin and statin Monitor BP and titrate medications accordingly   # Hypothyroid on Synthroid  # Sinus tachycardia: Heart rate up to 140s.  Seem to be sinus rhythm.  Etiology is not clear.  Patient is clinically dehydrated. -IV fluid: 2.5 L normal saline -As needed metoprolol IV 5 mg every 2 hour for heart rate> 125 -Continue home metoprolol 50 mg twice daily   # Nicotine dependence, continue nicotine patch   Body mass index is 18.13 kg/m.  Interventions:       Diet: Heart healthy  diet DVT Prophylaxis: Subcutaneous Lovenox   Advance goals of care discussion: DNR  Family Communication: family was not present at bedside, at the time of interview.  The pt provided permission to discuss medical plan with the family. Opportunity was given to ask question and all questions were answered satisfactorily.   Disposition:  Pt is from Home, admitted with copd exacerbation, still has copd, which precludes a safe discharge. Discharge to home, when stable.  Subjective: No significant events overnight, patient is improving, still has significant shortness of breath and wheezing.  Productive cough.  Denies any other active issues.  Physical Exam: General: NAD, lying comfortably Appear in no distress, affect appropriate Eyes: PERRLA ENT: Oral Mucosa Clear, moist  Neck: no JVD,  Cardiovascular: S1 and S2 Present, no Murmur,  Respiratory: Good air entry bilaterally, increased work of breathing, bilateral wheezing Abdomen: Bowel Sound present, Soft and no tenderness,  Skin: no rashes Extremities: no Pedal edema, no calf tenderness Neurologic: without any new focal findings Gait not checked due to patient safety concerns  Vitals:   02/26/23 0829 02/26/23 1130 02/26/23 1204 02/26/23 1352  BP: (!) 148/77 109/70 (!) 148/80   Pulse: (!) 112 87 96   Resp: 18 16 18    Temp: 98.2 F (36.8 C) 98.7 F (37.1 C) 98.4 F (36.9 C)   TempSrc: Oral Oral Oral   SpO2: 97% 96% 97% 95%  Weight:        Intake/Output Summary (Last 24 hours) at 02/26/2023 1530 Last data filed at 02/26/2023 1201 Gross per 24 hour  Intake 477 ml  Output 2050  ml  Net -1573 ml   Filed Weights   02/24/23 1137 02/25/23 0500  Weight: 47.2 kg 47.9 kg    Data Reviewed: I have personally reviewed and interpreted daily labs, tele strips, imagings as discussed above. I reviewed all nursing notes, pharmacy notes, vitals, pertinent old records I have discussed plan of care as described above with RN and  patient/family.  CBC: Recent Labs  Lab 02/24/23 1106 02/25/23 0659  WBC 9.4 8.0  NEUTROABS 8.8*  --   HGB 10.4* 9.3*  HCT 34.8* 30.0*  MCV 90.2 89.0  PLT 298 216   Basic Metabolic Panel: Recent Labs  Lab 02/24/23 1106 02/25/23 0659 02/26/23 0442  NA 142 139  --   K 4.1 3.3*  --   CL 102 108  --   CO2 21* 21*  --   GLUCOSE 90 143*  --   BUN 20 14  --   CREATININE 0.80 0.56  --   CALCIUM 8.9 8.1*  --   MG  --   --  1.7  PHOS  --   --  2.5    Studies: No results found.  Scheduled Meds:  amLODipine  5 mg Oral Daily   aspirin EC  81 mg Oral Daily   atorvastatin  10 mg Oral Daily   azithromycin  250 mg Oral Daily   enoxaparin (LOVENOX) injection  40 mg Subcutaneous Q24H   feeding supplement  237 mL Oral TID BM   ipratropium-albuterol  3 mL Nebulization TID   levothyroxine  50 mcg Oral q morning   methylPREDNISolone (SOLU-MEDROL) injection  40 mg Intravenous Q12H   metoprolol tartrate  50 mg Oral BID   mometasone-formoterol  2 puff Inhalation BID   montelukast  10 mg Oral QHS   multivitamin with minerals  1 tablet Oral Daily   nicotine  21 mg Transdermal Daily   pantoprazole  40 mg Oral Daily   risperiDONE  0.25 mg Oral QHS   thiamine  100 mg Oral Daily   traZODone  300 mg Oral QHS   Continuous Infusions: PRN Meds: acetaminophen, albuterol, dextromethorphan-guaiFENesin, haloperidol lactate, hydrALAZINE, metoprolol tartrate, ondansetron (ZOFRAN) IV  Time spent: 35 minutes  Author: Gillis Santa. MD [facility] 02/26/2023 3:30 PM  To reach On-call, see care teams to locate the attending and reach out to them via [email or web address]. If 7PM-7AM, please contact night-coverage If you still have difficulty reaching the attending provider, please page the [facility] (Director on Call) for [facility] on amion for assistance.

## 2023-02-27 ENCOUNTER — Inpatient Hospital Stay (HOSPITAL_COMMUNITY)
Admit: 2023-02-27 | Discharge: 2023-02-27 | Disposition: A | Discharge status: Home / Self Care | Payer: Medicare HMO | Attending: Student | Admitting: Student

## 2023-02-27 DIAGNOSIS — R008 Other abnormalities of heart beat: Secondary | ICD-10-CM | POA: Diagnosis not present

## 2023-02-27 DIAGNOSIS — J441 Chronic obstructive pulmonary disease with (acute) exacerbation: Secondary | ICD-10-CM | POA: Diagnosis not present

## 2023-02-27 LAB — MAGNESIUM: Magnesium: 1.7 mg/dL (ref 1.7–2.4)

## 2023-02-27 LAB — CULTURE, BLOOD (ROUTINE X 2)

## 2023-02-27 LAB — CBC
HCT: 33.2 % — ABNORMAL LOW (ref 36.0–46.0)
Hemoglobin: 10.4 g/dL — ABNORMAL LOW (ref 12.0–15.0)
MCH: 27.6 pg (ref 26.0–34.0)
MCHC: 31.3 g/dL (ref 30.0–36.0)
MCV: 88.1 fL (ref 80.0–100.0)
Platelets: 254 10*3/uL (ref 150–400)
RBC: 3.77 MIL/uL — ABNORMAL LOW (ref 3.87–5.11)
RDW: 16.1 % — ABNORMAL HIGH (ref 11.5–15.5)
WBC: 6.5 10*3/uL (ref 4.0–10.5)
nRBC: 0 % (ref 0.0–0.2)

## 2023-02-27 LAB — PHOSPHORUS: Phosphorus: 3 mg/dL (ref 2.5–4.6)

## 2023-02-27 LAB — BASIC METABOLIC PANEL
Anion gap: 6 (ref 5–15)
BUN: 15 mg/dL (ref 8–23)
CO2: 28 mmol/L (ref 22–32)
Calcium: 8.3 mg/dL — ABNORMAL LOW (ref 8.9–10.3)
Chloride: 109 mmol/L (ref 98–111)
Creatinine, Ser: 0.57 mg/dL (ref 0.44–1.00)
GFR, Estimated: >60 mL/min (ref >60)
Glucose, Bld: 139 mg/dL — ABNORMAL HIGH (ref 70–99)
Potassium: 3.5 mmol/L (ref 3.5–5.1)
Sodium: 143 mmol/L (ref 135–145)

## 2023-02-27 LAB — ECHOCARDIOGRAM COMPLETE

## 2023-02-27 LAB — CULTURE, RESPIRATORY W GRAM STAIN

## 2023-02-27 LAB — VITAMIN D 25 HYDROXY (VIT D DEFICIENCY, FRACTURES): Vit D, 25-Hydroxy: 30.61 ng/mL (ref 30–100)

## 2023-02-27 MED ORDER — VITAMIN B-12 1000 MCG PO TABS: 1000.0000 ug | ORAL_TABLET | Freq: Every day | ORAL | Status: DC

## 2023-02-27 MED ORDER — HYDROXYZINE HCL 25 MG PO TABS
25.0000 mg | ORAL_TABLET | Freq: Three times a day (TID) | ORAL | Status: DC | PRN
  Administered 2023-02-27 – 2023-03-02 (×7): 25 mg via ORAL
  Filled 2023-02-27 (×7): qty 1

## 2023-02-27 MED ORDER — CYANOCOBALAMIN 1000 MCG/ML IJ SOLN
1000.0000 ug | Freq: Every day | INTRAMUSCULAR | Status: DC
  Administered 2023-02-27 – 2023-02-28 (×2): 1000 ug via INTRAMUSCULAR
  Filled 2023-02-27 (×4): qty 1

## 2023-02-27 MED ORDER — ALPRAZOLAM 0.5 MG PO TABS
0.5000 mg | ORAL_TABLET | Freq: Three times a day (TID) | ORAL | Status: DC | PRN
  Administered 2023-02-28 – 2023-03-02 (×5): 0.5 mg via ORAL
  Filled 2023-02-27 (×5): qty 1

## 2023-02-27 MED ORDER — MELATONIN 5 MG PO TABS
5.0000 mg | ORAL_TABLET | Freq: Every day | ORAL | Status: DC
  Administered 2023-02-27 – 2023-03-01 (×3): 5 mg via ORAL
  Filled 2023-02-27 (×3): qty 1

## 2023-02-27 MED ORDER — SODIUM CHLORIDE 0.9 % IV SOLN
300.0000 mg | Freq: Every day | INTRAVENOUS | Status: AC
  Administered 2023-02-27 – 2023-02-28 (×2): 300 mg via INTRAVENOUS
  Filled 2023-02-27 (×2): qty 300
  Filled 2023-02-27: qty 15

## 2023-02-27 NOTE — TOC Progression Note (Signed)
Transition of Care [facility]) - Progression Note    Patient Details  Name: [name] MRN: [id number] Date of Birth: 07/03/1946  Transition of Care [facility]) CM/SW Contact  Truddie Hidden, RN Phone Number: 02/27/2023, 3:29 PM  Clinical Narrative:    Case reviewed for DME needs and changes in discharge disposition.         Expected Discharge Plan and Services                                               Social Determinants of Health (SDOH) Interventions SDOH Screenings   Food Insecurity: No Food Insecurity (02/02/2023)  Housing: Low Risk  (02/02/2023)  Transportation Needs: No Transportation Needs (02/02/2023)  Utilities: Not At Risk (02/02/2023)  Depression (PHQ2-9): Low Risk  (07/13/2021)  Tobacco Use: High Risk (02/25/2023)    Readmission Risk Interventions    01/31/2023   11:49 AM 07/05/2022   10:50 AM 11/25/2021    9:50 AM  Readmission Risk Prevention Plan  Transportation Screening Complete Complete Complete  Medication Review Oceanographer) Complete Complete Complete  PCP or Specialist appointment within 3-5 days of discharge Complete  Complete  HRI or Home Care Consult  Complete Complete  SW Recovery Care/Counseling Consult Complete Complete   Palliative Care Screening Not Applicable  Not Applicable  Skilled Nursing Facility Not Applicable Not Applicable Not Applicable

## 2023-02-27 NOTE — Progress Notes (Signed)
[facility] Progress Note  Patient: [name]    MSX:[id number]  DOA: 02/24/2023     Date of Service: the patient was seen and examined on 02/27/2023  Chief Complaint  Patient presents with   Altered Mental Status   Shortness of Breath   Brief hospital course: [name] is a 77 y.o. female with medical history significant of  COPD (on 2-3L oxygen), hypertension, hyperlipidemia, PVD, CAD, hypothyroidism, GERD, depression with anxiety, anemia, former smoker, medication noncompliance, who presents with shortness of breath and AMS.   ED workup: WBC 9.4, negative COVID PCR, negative urinalysis, GFR> 60, temperature normal, blood pressure 163/83, heart rate 143, RR 24, oxygen saturation 98% on 2 L oxygen.  Chest x-ray negative.  CT head negative.  ABG with pH 7.27, CO2 44, O2 59.7.  Patient is admitted to telemetry bed as inpatient.     Assessment and Plan:  # COPD exacerbation Continue supplemental O2 inhalation Continue IV Solu-Medrol 40 every 12 hourly Continue Z-Pak Continue Dulera inhaler twice daily, DuoNeb 3 times a day scheduled PPI for GI prophylaxis   # Acute metabolic encephalopathy and acute delirium CT head negative. Patient has hallucination. Potential differential diagnosis include dementia with behavior change and psychosis.  Started Risperdal 0.25 mg p.o. QHS and trazodone 300 mg nightly Seen by psychiatry  # CAD, Hypertension, HLD continue amlodipine, metoprolol, aspirin and statin Monitor BP and titrate medications accordingly   # Hypothyroid on Synthroid  # Sinus tachycardia most likely due to anxiety Heart rate up to 140s.  Seem to be sinus rhythm.   IV fluid: 2.5 L normal saline -As needed metoprolol IV 5 mg every 2 hour for heart rate> 125 -Continue home metoprolol 50 mg twice daily Resumed Xanax 0.5 mg p.o. 3 times daily (home dose 1 mg p.o. 3 times daily)   # Nicotine dependence, continue nicotine patch  # Insomnia, started melatonin,  patient is already on trazodone. # Anemia due to iron deficiency, transferrin saturation 5%.  Started Venofer 20 mg IV daily x 3 doses, patient will need oral iron supplement on discharge. # Vitamin B12 level 278, goal >400, started vitamin B12 1000 mcg IM injection during hospital stay followed by oral supplement.   Body mass index is 18.13 kg/m.  Interventions:       Diet: Heart healthy diet DVT Prophylaxis: Subcutaneous Lovenox   Advance goals of care discussion: DNR  Family Communication: family was not present at bedside, at the time of interview.  The pt provided permission to discuss medical plan with the family. Opportunity was given to ask question and all questions were answered satisfactorily.   Disposition:  Pt is from Home, admitted with copd exacerbation, still has copd, which precludes a safe discharge. Discharge to home, when stable.  Subjective: No significant events overnight, feels improvement in the breathing, did not sleep well last night, patient was having some hallucinations and anxiety.  Denies any chest pain or palpitations. Patient stated that she takes Xanax 1 mg p.o. 3 times daily at home   Physical Exam: General: NAD, lying comfortably Appear in no distress, affect appropriate Eyes: PERRLA ENT: Oral Mucosa Clear, Brown  Neck: no JVD,  Cardiovascular: S1 and S2 Present, no Murmur,  Respiratory: Good air entry bilaterally, increased work of breathing, bilateral wheezing Abdomen: Bowel Sound present, Soft and no tenderness,  Skin: no rashes Extremities: no Pedal edema, no calf tenderness Neurologic: without any new focal findings Gait not checked due to patient safety concerns  Vitals:   02/27/23 0522 02/27/23 0748 02/27/23 0829 02/27/23 1217  BP: (!) 170/96  (!) 175/97 139/71  Pulse: (!) 102  (!) 127 85  Resp: 20  18 (!) 25  Temp: 98.2 F (36.8 C)  97.9 F (36.6 C) 98.1 F (36.7 C)  TempSrc: Oral   Oral  SpO2: 99% 98% 100% 99%  Weight:  46.6 kg       Intake/Output Summary (Last 24 hours) at 02/27/2023 1514 Last data filed at 02/27/2023 1415 Gross per 24 hour  Intake 1200 ml  Output 2875 ml  Net -1675 ml   Filed Weights   02/24/23 1137 02/25/23 0500 02/27/23 0522  Weight: 47.2 kg 47.9 kg 46.6 kg    Data Reviewed: I have personally reviewed and interpreted daily labs, tele strips, imagings as discussed above. I reviewed all nursing notes, pharmacy notes, vitals, pertinent old records I have discussed plan of care as described above with RN and patient/family.  CBC: Recent Labs  Lab 02/24/23 1106 02/25/23 0659 02/27/23 0518  WBC 9.4 8.0 6.5  NEUTROABS 8.8*  --   --   HGB 10.4* 9.3* 10.4*  HCT 34.8* 30.0* 33.2*  MCV 90.2 89.0 88.1  PLT 298 216 254   Basic Metabolic Panel: Recent Labs  Lab 02/24/23 1106 02/25/23 0659 02/26/23 0442 02/27/23 0518  NA 142 139  --  143  K 4.1 3.3*  --  3.5  CL 102 108  --  109  CO2 21* 21*  --  28  GLUCOSE 90 143*  --  139*  BUN 20 14  --  15  CREATININE 0.80 0.56  --  0.57  CALCIUM 8.9 8.1*  --  8.3*  MG  --   --  1.7 1.7  PHOS  --   --  2.5 3.0    Studies: No results found.  Scheduled Meds:  amLODipine  5 mg Oral Daily   aspirin EC  81 mg Oral Daily   atorvastatin  10 mg Oral Daily   azithromycin  250 mg Oral Daily   cyanocobalamin  1,000 mcg Intramuscular Daily   Followed by   Melene Muller ON 03/06/2023] vitamin B-12  1,000 mcg Oral Daily   enoxaparin (LOVENOX) injection  40 mg Subcutaneous Q24H   feeding supplement  237 mL Oral TID BM   ipratropium-albuterol  3 mL Nebulization TID   levothyroxine  50 mcg Oral q morning   melatonin  5 mg Oral QHS   methylPREDNISolone (SOLU-MEDROL) injection  40 mg Intravenous Q12H   metoprolol tartrate  50 mg Oral BID   mometasone-formoterol  2 puff Inhalation BID   montelukast  10 mg Oral QHS   multivitamin with minerals  1 tablet Oral Daily   nicotine  21 mg Transdermal Daily   pantoprazole  40 mg Oral Daily   risperiDONE   0.25 mg Oral QHS   thiamine  100 mg Oral Daily   traZODone  300 mg Oral QHS   Continuous Infusions:  iron sucrose 300 mg (02/27/23 1128)   PRN Meds: acetaminophen, albuterol, ALPRAZolam, benzonatate, dextromethorphan-guaiFENesin, haloperidol lactate, hydrALAZINE, hydrOXYzine, metoprolol tartrate, ondansetron (ZOFRAN) IV  Time spent: 35 minutes  Author: Gillis Santa. MD [facility] 02/27/2023 3:14 PM  To reach On-call, see care teams to locate the attending and reach out to them via [email or web address]. If 7PM-7AM, please contact night-coverage If you still have difficulty reaching the attending provider, please page the [facility] (Director on Call) for [facility] on amion for assistance.

## 2023-02-27 NOTE — Plan of Care (Signed)

## 2023-02-27 NOTE — Care Management Important Message (Signed)
Important Message  Patient Details  Name: [name] MRN: [id number] Date of Birth: 06/14/1946   Medicare Important Message Given:  Yes     Johnell Comings 02/27/2023, 11:28 AM

## 2023-02-28 DIAGNOSIS — J441 Chronic obstructive pulmonary disease with (acute) exacerbation: Secondary | ICD-10-CM | POA: Diagnosis not present

## 2023-02-28 LAB — BASIC METABOLIC PANEL
Anion gap: 5 (ref 5–15)
BUN: 19 mg/dL (ref 8–23)
CO2: 29 mmol/L (ref 22–32)
Calcium: 8.5 mg/dL — ABNORMAL LOW (ref 8.9–10.3)
Chloride: 107 mmol/L (ref 98–111)
Creatinine, Ser: 0.6 mg/dL (ref 0.44–1.00)
GFR, Estimated: >60 mL/min (ref >60)
Glucose, Bld: 149 mg/dL — ABNORMAL HIGH (ref 70–99)
Potassium: 3.8 mmol/L (ref 3.5–5.1)
Sodium: 141 mmol/L (ref 135–145)

## 2023-02-28 LAB — ECHOCARDIOGRAM COMPLETE
AR max vel: 2.04 cm2
AV Area VTI: 2.45 cm2
AV Area mean vel: 1.84 cm2
AV Mean grad: 4.4 mmHg
AV Peak grad: 7.8 mmHg
Ao pk vel: 1.4 m/s
Area-P 1/2: 6.24 cm2
S' Lateral: 2.9 cm
Weight: 1643.75 oz

## 2023-02-28 LAB — CBC
HCT: 31.2 % — ABNORMAL LOW (ref 36.0–46.0)
Hemoglobin: 9.6 g/dL — ABNORMAL LOW (ref 12.0–15.0)
MCH: 27.4 pg (ref 26.0–34.0)
MCHC: 30.8 g/dL (ref 30.0–36.0)
MCV: 88.9 fL (ref 80.0–100.0)
Platelets: 238 10*3/uL (ref 150–400)
RBC: 3.51 MIL/uL — ABNORMAL LOW (ref 3.87–5.11)
RDW: 16.1 % — ABNORMAL HIGH (ref 11.5–15.5)
WBC: 6.1 10*3/uL (ref 4.0–10.5)
nRBC: 0 % (ref 0.0–0.2)

## 2023-02-28 LAB — MAGNESIUM: Magnesium: 1.8 mg/dL (ref 1.7–2.4)

## 2023-02-28 LAB — PHOSPHORUS: Phosphorus: 4 mg/dL (ref 2.5–4.6)

## 2023-02-28 MED ORDER — METHOCARBAMOL 500 MG PO TABS
500.0000 mg | ORAL_TABLET | Freq: Once | ORAL | Status: AC
  Administered 2023-02-28: 500 mg via ORAL
  Filled 2023-02-28: qty 1

## 2023-02-28 MED ORDER — IPRATROPIUM-ALBUTEROL 0.5-2.5 (3) MG/3ML IN SOLN
3.0000 mL | Freq: Two times a day (BID) | RESPIRATORY_TRACT | Status: DC
  Administered 2023-02-28 – 2023-03-02 (×3): 3 mL via RESPIRATORY_TRACT
  Filled 2023-02-28 (×4): qty 3

## 2023-02-28 MED ORDER — VITAMIN D (ERGOCALCIFEROL) 1.25 MG (50000 UNIT) PO CAPS
50000.0000 [IU] | ORAL_CAPSULE | ORAL | Status: DC
  Administered 2023-02-28: 50000 [IU] via ORAL
  Filled 2023-02-28: qty 1

## 2023-02-28 NOTE — Progress Notes (Signed)
[facility] Progress Note  Patient: [name]    ZOX:[id number]  DOA: 02/24/2023     Date of Service: the patient was seen and examined on 02/28/2023  Chief Complaint  Patient presents with   Altered Mental Status   Shortness of Breath   Brief hospital course: [name] is a 77 y.o. female with medical history significant of  COPD (on 2-3L oxygen), hypertension, hyperlipidemia, PVD, CAD, hypothyroidism, GERD, depression with anxiety, anemia, former smoker, medication noncompliance, who presents with shortness of breath and AMS.   ED workup: WBC 9.4, negative COVID PCR, negative urinalysis, GFR> 60, temperature normal, blood pressure 163/83, heart rate 143, RR 24, oxygen saturation 98% on 2 L oxygen.  Chest x-ray negative.  CT head negative.  ABG with pH 7.27, CO2 44, O2 59.7.  Patient is admitted to telemetry bed as inpatient.     Assessment and Plan:  # COPD exacerbation Continue supplemental O2 inhalation Continue IV Solu-Medrol 40 every 12 hourly Continue Z-Pak Continue Dulera inhaler twice daily, DuoNeb 3 times a day scheduled PPI for GI prophylaxis   # Acute metabolic encephalopathy and acute delirium CT head negative. Patient has hallucination. Potential differential diagnosis include dementia with behavior change and psychosis.  Started Risperdal 0.25 mg p.o. QHS and trazodone 300 mg nightly Seen by psychiatry  # CAD, Hypertension, HLD continue amlodipine, metoprolol, aspirin and statin Monitor BP and titrate medications accordingly   # Hypothyroid on Synthroid  # Sinus tachycardia most likely due to anxiety Heart rate up to 140s.  Seem to be sinus rhythm.   IV fluid: 2.5 L normal saline -As needed metoprolol IV 5 mg every 2 hour for heart rate> 125 -Continue home metoprolol 50 mg twice daily Resumed Xanax 0.5 mg p.o. 3 times daily (home dose 1 mg p.o. 3 times daily)   # Nicotine dependence, continue nicotine patch  # Insomnia, started melatonin,  patient is already on trazodone. # Anemia due to iron deficiency, transferrin saturation 5%.  Started Venofer 20 mg IV daily x 3 doses, patient will need oral iron supplement on discharge. # Vitamin B12 level 278, goal >400, started vitamin B12 1000 mcg IM injection during hospital stay followed by oral supplement. # Vitamin D level 30, borderline low.  Started oral supplement.  Body mass index is 18.13 kg/m.  Interventions:       Diet: Heart healthy diet DVT Prophylaxis: Subcutaneous Lovenox   Advance goals of care discussion: DNR  Family Communication: family was not present at bedside, at the time of interview.  The pt provided permission to discuss medical plan with the family. Opportunity was given to ask question and all questions were answered satisfactorily.   Disposition:  Pt is from Home, admitted with copd exacerbation, still has copd, which precludes a safe discharge. Discharge to home, when stable.  Most likely in 1 to 2 days  Subjective: No significant events overnight, patient still having some shortness of breath and productive cough.  Sleep improved.  Denied any hallucination.  Stated that she does not know. Patient was concerned about her right big toe, stated that she is following with podiatry, no active issues other than off-and-on pain.   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 Neurologic: without any new focal findings Gait not checked due to patient safety concerns  Vitals:   02/28/23 0009 02/28/23 0412 02/28/23 0852 02/28/23 1202   BP: (!) 148/74 (!) 143/78 (!) 156/73 131/71  Pulse: 92 80 (!) 105 90  Resp: 20 18 18  (!) 25  Temp: 98.6 F (37 C) (!) 97.4 F (36.3 C) 97.6 F (36.4 C) 98.8 F (37.1 C)  TempSrc: Oral Axillary Oral   SpO2: 99% 99% 97% 99%  Weight:  46.4 kg      Intake/Output Summary (Last 24 hours) at 02/28/2023 1419 Last data filed at 02/28/2023 1300 Gross per 24 hour  Intake 480 ml  Output 1550 ml  Net -1070 ml   Filed Weights   02/25/23 0500 02/27/23 0522 02/28/23 0412  Weight: 47.9 kg 46.6 kg 46.4 kg    Data Reviewed: I have personally reviewed and interpreted daily labs, tele strips, imagings as discussed above. I reviewed all nursing notes, pharmacy notes, vitals, pertinent old records I have discussed plan of care as described above with RN and patient/family.  CBC: Recent Labs  Lab 02/24/23 1106 02/25/23 0659 02/27/23 0518 02/28/23 0539  WBC 9.4 8.0 6.5 6.1  NEUTROABS 8.8*  --   --   --   HGB 10.4* 9.3* 10.4* 9.6*  HCT 34.8* 30.0* 33.2* 31.2*  MCV 90.2 89.0 88.1 88.9  PLT 298 216 254 238   Basic Metabolic Panel: Recent Labs  Lab 02/24/23 1106 02/25/23 0659 02/26/23 0442 02/27/23 0518 02/28/23 0539  NA 142 139  --  143 141  K 4.1 3.3*  --  3.5 3.8  CL 102 108  --  109 107  CO2 21* 21*  --  28 29  GLUCOSE 90 143*  --  139* 149*  BUN 20 14  --  15 19  CREATININE 0.80 0.56  --  0.57 0.60  CALCIUM 8.9 8.1*  --  8.3* 8.5*  MG  --   --  1.7 1.7 1.8  PHOS  --   --  2.5 3.0 4.0    Studies: ECHOCARDIOGRAM COMPLETE  Result Date: 02/28/2023    ECHOCARDIOGRAM REPORT   Patient Name:   [name] Date of Exam: 02/27/2023 Medical Rec #:  [id number]      Height:       64.0 in Accession #:    [id number]     Weight:       102.7 lb Date of Birth:  07-16-1946      BSA:          1.474 m Patient Age:    76 years       BP:           153/92 mmHg Patient Gender: F              HR:           107 bpm. Exam Location:  [facility] Procedure: 2D Echo, Cardiac Doppler and Color Doppler  Indications:     R00.8 Other abnormalities of the heart  History:         Patient has prior history of Echocardiogram examinations, most                  recent 01/17/2021. CAD, COPD, Signs/Symptoms:Dyspnea; Risk                  Factors:Hypertension.  Hypothyroidism. Peripheral vascular                  disease.  Sonographer:     Daphine Deutscher RDCS Referring Phys:  [id number] Gillis Santa Diagnosing Phys: Yvonne Kendall MD IMPRESSIONS  1. Left ventricular ejection fraction, by estimation, is 55 to 60%. The left ventricle has normal function. Left ventricular endocardial border not optimally defined to evaluate regional wall motion. Indeterminate diastolic filling due to E-A fusion.  2. Right ventricular systolic function is normal. The right ventricular size is normal. Tricuspid regurgitation signal is inadequate for assessing PA pressure.  3. The mitral valve is grossly normal. Trivial mitral valve regurgitation. No evidence of mitral stenosis.  4. The aortic valve has an indeterminant number of cusps. There is mild thickening of the aortic valve. Aortic valve regurgitation is not visualized. No aortic stenosis is present.  5. The inferior vena cava is normal in size with greater than 50% respiratory variability, suggesting right atrial pressure of 3 mmHg. FINDINGS  Left Ventricle: Left ventricular ejection fraction, by estimation, is 55 to 60%. The left ventricle has normal function. Left ventricular endocardial border not optimally defined to evaluate regional wall motion. The left ventricular internal cavity size was normal in size. There is no left ventricular hypertrophy. Indeterminate diastolic filling due to E-A fusion. Right Ventricle: The right ventricular size is normal. No increase in right ventricular wall thickness. Right ventricular systolic function is normal. Tricuspid regurgitation signal is inadequate for assessing PA pressure. Left Atrium: Left atrial size was normal in size. Right Atrium:  Right atrial size was normal in size. Pericardium: There is no evidence of pericardial effusion. Mitral Valve: The mitral valve is grossly normal. Trivial mitral valve regurgitation. No evidence of mitral valve stenosis. Tricuspid Valve: The tricuspid valve is grossly normal. Tricuspid valve regurgitation is not demonstrated. Aortic Valve: The aortic valve has an indeterminant number of cusps. There is mild thickening of the aortic valve. Aortic valve regurgitation is not visualized. No aortic stenosis is present. Aortic valve mean gradient measures 4.4 mmHg. Aortic valve peak gradient measures 7.8 mmHg. Aortic valve area, by VTI measures 2.45 cm. Pulmonic Valve: The pulmonic valve was not well visualized. Pulmonic valve regurgitation is not visualized. No evidence of pulmonic stenosis. Aorta: The aortic root is normal in size and structure. Pulmonary Artery: The pulmonary artery is not well seen. Venous: The inferior vena cava is normal in size with greater than 50% respiratory variability, suggesting right atrial pressure of 3 mmHg. IAS/Shunts: The interatrial septum was not well visualized.  LEFT VENTRICLE PLAX 2D LVIDd:         3.90 cm   Diastology LVIDs:         2.90 cm   LV e' medial:    7.07 cm/s LV PW:         0.90 cm   LV E/e' medial:  8.8 LV IVS:        0.90 cm   LV e' lateral:   7.62 cm/s LVOT diam:     1.80 cm   LV E/e' lateral: 8.1 LV SV:         51 LV SV Index:   35 LVOT Area:     2.54 cm  RIGHT VENTRICLE             IVC RV Basal diam:  3.30 cm     IVC diam: 1.10 cm RV S prime:     18.85 cm/s TAPSE (M-mode): 1.4 cm LEFT ATRIUM  Index        RIGHT ATRIUM           Index LA diam:        3.50 cm 2.37 cm/m   RA Area:     12.10 cm LA Vol (A2C):   35.2 ml 23.88 ml/m  RA Volume:   29.10 ml  19.74 ml/m LA Vol (A4C):   48.3 ml 32.77 ml/m LA Biplane Vol: 41.3 ml 28.02 ml/m  AORTIC VALVE AV Area (Vmax):    2.04 cm AV Area (Vmean):   1.84 cm AV Area (VTI):     2.45 cm AV Vmax:            139.56 cm/s AV Vmean:          99.445 cm/s AV VTI:            0.210 m AV Peak Grad:      7.8 mmHg AV Mean Grad:      4.4 mmHg LVOT Vmax:         111.66 cm/s LVOT Vmean:        71.730 cm/s LVOT VTI:          0.202 m LVOT/AV VTI ratio: 0.96  AORTA Ao Root diam: 3.10 cm MITRAL VALVE MV Area (PHT): 6.24 cm     SHUNTS MV Decel Time: 122 msec     Systemic VTI:  0.20 m MV E velocity: 61.95 cm/s   Systemic Diam: 1.80 cm MV A velocity: 109.50 cm/s MV E/A ratio:  0.57 Christopher End MD Electronically signed by Yvonne Kendall MD Signature Date/Time: 02/28/2023/6:53:40 AM    Final     Scheduled Meds:  amLODipine  5 mg Oral Daily   aspirin EC  81 mg Oral Daily   atorvastatin  10 mg Oral Daily   cyanocobalamin  1,000 mcg Intramuscular Daily   Followed by   Melene Muller ON 03/06/2023] vitamin B-12  1,000 mcg Oral Daily   enoxaparin (LOVENOX) injection  40 mg Subcutaneous Q24H   feeding supplement  237 mL Oral TID BM   ipratropium-albuterol  3 mL Nebulization BID   levothyroxine  50 mcg Oral q morning   melatonin  5 mg Oral QHS   methylPREDNISolone (SOLU-MEDROL) injection  40 mg Intravenous Q12H   metoprolol tartrate  50 mg Oral BID   mometasone-formoterol  2 puff Inhalation BID   montelukast  10 mg Oral QHS   multivitamin with minerals  1 tablet Oral Daily   nicotine  21 mg Transdermal Daily   pantoprazole  40 mg Oral Daily   risperiDONE  0.25 mg Oral QHS   thiamine  100 mg Oral Daily   traZODone  300 mg Oral QHS   Continuous Infusions:  iron sucrose 300 mg (02/28/23 1116)   PRN Meds: acetaminophen, albuterol, ALPRAZolam, benzonatate, dextromethorphan-guaiFENesin, haloperidol lactate, hydrALAZINE, hydrOXYzine, metoprolol tartrate, ondansetron (ZOFRAN) IV  Time spent: 35 minutes  Author: Gillis Santa. MD [facility] 02/28/2023 2:19 PM  To reach On-call, see care teams to locate the attending and reach out to them via [email or web address]. If 7PM-7AM, please contact night-coverage If you still have  difficulty reaching the attending provider, please page the [facility] (Director on Call) for [facility] on amion for assistance.

## 2023-02-28 NOTE — Plan of Care (Signed)

## 2023-03-01 DIAGNOSIS — J441 Chronic obstructive pulmonary disease with (acute) exacerbation: Secondary | ICD-10-CM | POA: Diagnosis not present

## 2023-03-01 LAB — CBC
HCT: 34.1 % — ABNORMAL LOW (ref 36.0–46.0)
Hemoglobin: 10.2 g/dL — ABNORMAL LOW (ref 12.0–15.0)
MCH: 27.2 pg (ref 26.0–34.0)
MCHC: 29.9 g/dL — ABNORMAL LOW (ref 30.0–36.0)
MCV: 90.9 fL (ref 80.0–100.0)
Platelets: 238 10*3/uL (ref 150–400)
RBC: 3.75 MIL/uL — ABNORMAL LOW (ref 3.87–5.11)
RDW: 15.9 % — ABNORMAL HIGH (ref 11.5–15.5)
WBC: 5.3 10*3/uL (ref 4.0–10.5)
nRBC: 0 % (ref 0.0–0.2)

## 2023-03-01 LAB — CULTURE, RESPIRATORY W GRAM STAIN

## 2023-03-01 LAB — BASIC METABOLIC PANEL
Anion gap: 7 (ref 5–15)
BUN: 17 mg/dL (ref 8–23)
CO2: 29 mmol/L (ref 22–32)
Calcium: 8.6 mg/dL — ABNORMAL LOW (ref 8.9–10.3)
Chloride: 105 mmol/L (ref 98–111)
Creatinine, Ser: 0.67 mg/dL (ref 0.44–1.00)
GFR, Estimated: >60 mL/min (ref >60)
Glucose, Bld: 81 mg/dL (ref 70–99)
Potassium: 3.3 mmol/L — ABNORMAL LOW (ref 3.5–5.1)
Sodium: 141 mmol/L (ref 135–145)

## 2023-03-01 LAB — CULTURE, BLOOD (ROUTINE X 2)

## 2023-03-01 LAB — MAGNESIUM: Magnesium: 1.6 mg/dL — ABNORMAL LOW (ref 1.7–2.4)

## 2023-03-01 LAB — PHOSPHORUS: Phosphorus: 3.6 mg/dL (ref 2.5–4.6)

## 2023-03-01 MED ORDER — PREDNISONE 20 MG PO TABS: 30.0000 mg | ORAL_TABLET | Freq: Every day | ORAL | Status: DC

## 2023-03-01 MED ORDER — PREDNISONE 20 MG PO TABS: 20.0000 mg | ORAL_TABLET | Freq: Every day | ORAL | Status: DC

## 2023-03-01 MED ORDER — PREDNISONE 10 MG PO TABS: 10.0000 mg | ORAL_TABLET | Freq: Every day | ORAL | Status: DC

## 2023-03-01 MED ORDER — METHYLPREDNISOLONE SODIUM SUCC 40 MG IJ SOLR
40.0000 mg | Freq: Two times a day (BID) | INTRAMUSCULAR | Status: AC
  Administered 2023-03-01: 40 mg via INTRAVENOUS
  Filled 2023-03-01: qty 1

## 2023-03-01 MED ORDER — POTASSIUM CHLORIDE CRYS ER 20 MEQ PO TBCR
40.0000 meq | EXTENDED_RELEASE_TABLET | Freq: Once | ORAL | Status: AC
  Administered 2023-03-01: 40 meq via ORAL
  Filled 2023-03-01: qty 2

## 2023-03-01 MED ORDER — ENSURE ENLIVE PO LIQD
237.0000 mL | Freq: Two times a day (BID) | ORAL | Status: DC
  Administered 2023-03-02: 237 mL via ORAL

## 2023-03-01 MED ORDER — OXYCODONE HCL 5 MG PO TABS
5.0000 mg | ORAL_TABLET | Freq: Four times a day (QID) | ORAL | Status: DC | PRN
  Administered 2023-03-01 – 2023-03-02 (×3): 5 mg via ORAL
  Filled 2023-03-01 (×4): qty 1

## 2023-03-01 MED ORDER — POLYSACCHARIDE IRON COMPLEX 150 MG PO CAPS
150.0000 mg | ORAL_CAPSULE | Freq: Every day | ORAL | Status: DC
  Administered 2023-03-02: 150 mg via ORAL
  Filled 2023-03-01: qty 1

## 2023-03-01 MED ORDER — PIPERACILLIN-TAZOBACTAM 3.375 G IVPB
3.3750 g | Freq: Three times a day (TID) | INTRAVENOUS | Status: DC
  Administered 2023-03-01 – 2023-03-02 (×3): 3.375 g via INTRAVENOUS
  Filled 2023-03-01 (×3): qty 50

## 2023-03-01 MED ORDER — MAGNESIUM SULFATE 2 GM/50ML IV SOLN
2.0000 g | Freq: Once | INTRAVENOUS | Status: AC
  Administered 2023-03-01: 2 g via INTRAVENOUS
  Filled 2023-03-01: qty 50

## 2023-03-01 MED ORDER — VITAMIN C 500 MG PO TABS
500.0000 mg | ORAL_TABLET | Freq: Every day | ORAL | Status: DC
  Administered 2023-03-02: 500 mg via ORAL
  Filled 2023-03-01: qty 1

## 2023-03-01 MED ORDER — PREDNISONE 20 MG PO TABS
40.0000 mg | ORAL_TABLET | Freq: Every day | ORAL | Status: DC
  Administered 2023-03-02: 40 mg via ORAL
  Filled 2023-03-01: qty 2

## 2023-03-01 NOTE — Progress Notes (Signed)
Nutrition Follow-up  DOCUMENTATION CODES:   Severe malnutrition in context of chronic illness, Underweight  INTERVENTION:   -Decrease Ensure Enlive po to BID, each supplement provides 350 kcal and 20 grams of protein.  -Liberalize diet to regular for wider variety of meal selections -MVI with minerals daily  NUTRITION DIAGNOSIS:   Severe Malnutrition related to  (COPD) as evidenced by severe fat depletion, severe muscle depletion, percent weight loss.  Ongoing  GOAL:   Patient will meet greater than or equal to 90% of their needs  Progressing   MONITOR:   PO intake, Supplement acceptance  REASON FOR ASSESSMENT:   Consult Assessment of nutrition requirement/status  ASSESSMENT:   77 y.o. female admits related to SOB and AMS. PMH includes: anxiety, CAD, COPD, depression, GERD, HCAP, hiatal hernia, HTN. Pt is currently receiving medical management related to COPD exacerbation.  Reviewed I/O's: -850 ml x 24 hours and -4.4 L since admission  UOP: 2.1 L x 24 hours   Spoke with pt at bedside, who was pleasant and in good spirits today. She reports feeling a lot better. Of note, pt is very hard of hearing, but does better if you stand near her left side. Pt reports improved oral intake since admission; noted meal completions 50-100%. Pt consumed about 90% of her breakfast. Pt is also drinking Ensure supplements, about 1-2 per day. Pt shares that she is "not crazy about them" but agreeable to continue when RD explained its importance. Pt reports that she has been making great progress with therapy, but states that pain management is sometimes a limiting factor ("you don't want to do anything when you are in pain").   Discussed importance of good meal and supplement intake to promote healing.   Pt encouraged pt her progress and is eager to go home soon so she can see her dog Lowella Bandy).   Reviewed wt hx; pt has experienced a 9.7% wt loss over the past month, which is significant for  time frame. Wt has been stable since admission.   Medications reviewed and include vitamin B-12 and solu-medrol.   Labs reviewed: K: 3.3, CBGS: 107 (inpatient orders for glycemic control are none).    Diet Order:   Diet Order             Diet Heart Fluid consistency: Thin  Diet effective now                   EDUCATION NEEDS:   Education needs have been addressed  Skin:  Skin Assessment: Reviewed RN Assessment  Last BM:  Unknown  Height:   Ht Readings from Last 1 Encounters:  02/02/23 5\' 4"  (1.626 m)    Weight:   Wt Readings from Last 1 Encounters:  03/01/23 48.3 kg    Ideal Body Weight:  54.5 kg  BMI:  Body mass index is 18.28 kg/m.  Estimated Nutritional Needs:   Kcal:  1700-1900  Protein:  90-105 grams  Fluid:  > 1.7 L    Levada Schilling, RD, LDN, CDCES Registered Dietitian II Certified Diabetes Care and Education Specialist Please refer to [facility] for RD and/or RD on-call/weekend/after hours pager

## 2023-03-01 NOTE — Progress Notes (Addendum)
[facility] Progress Note  Patient: [name]    TFT:[id number]  DOA: 02/24/2023     Date of Service: the patient was seen and examined on 03/01/2023  Chief Complaint  Patient presents with   Altered Mental Status   Shortness of Breath   Brief hospital course: Dessa Szucs is a 77 y.o. female with medical history significant of  COPD (on 2-3L oxygen), hypertension, hyperlipidemia, PVD, CAD, hypothyroidism, GERD, depression with anxiety, anemia, former smoker, medication noncompliance, who presents with shortness of breath and AMS.   ED workup: WBC 9.4, negative COVID PCR, negative urinalysis, GFR> 60, temperature normal, blood pressure 163/83, heart rate 143, RR 24, oxygen saturation 98% on 2 L oxygen.  Chest x-ray negative.  CT head negative.  ABG with pH 7.27, CO2 44, O2 59.7.  Patient is admitted to telemetry bed as inpatient.     Assessment and Plan:  # COPD exacerbation Continue supplemental O2 inhalation S/p IV Solu-Medrol 40 every 12 hourly end on 4/11, started taper oral prednisone S/p Z-Pak completed course  Continue Dulera inhaler twice daily, DuoNeb 3 times a day scheduled PPI for GI prophylaxis   # Acute metabolic encephalopathy and acute delirium CT head negative. Patient has hallucination. Potential differential diagnosis include dementia with behavior change and psychosis.  Started Risperdal 0.25 mg p.o. QHS and trazodone 300 mg nightly Seen by psychiatry  # CAD, Hypertension, HLD continue amlodipine, metoprolol, aspirin and statin Monitor BP and titrate medications accordingly   # Hypothyroid on Synthroid  # Sinus tachycardia most likely due to anxiety Heart rate up to 140s.  Seem to be sinus rhythm.   IV fluid: 2.5 L normal saline -As needed metoprolol IV 5 mg every 2 hour for heart rate> 125 -Continue home metoprolol 50 mg twice daily Resumed Xanax 0.5 mg p.o. 3 times daily (home dose 1 mg p.o. 3 times daily)  # Hypokalemia, potassium  repleted. # Hypomagnesemia, mag repleted. Monitor electrolytes and replete as needed. # Nicotine dependence, continue nicotine patch # Insomnia, started melatonin, patient is already on trazodone. # Anemia due to iron deficiency, transferrin saturation 5%.  Started Venofer 300 mg IV daily x 3 doses, patient will need oral iron supplement on discharge. # Vitamin B12 level 278, goal >400, started vitamin B12 1000 mcg IM injection during hospital stay followed by oral supplement. # Vitamin D level 30, borderline low.  Started oral supplement.  Body mass index is 18.13 kg/m.  Interventions:      Diet: Heart healthy diet DVT Prophylaxis: Subcutaneous Lovenox   Advance goals of care discussion: DNR  Family Communication: family was not present at bedside, at the time of interview.  The pt provided permission to discuss medical plan with the family. Opportunity was given to ask question and all questions were answered satisfactorily.   Disposition:  Pt is from Home, admitted with copd exacerbation, still has copd, which precludes a safe discharge. Discharge to home, when stable.  Most likely tomorrow a.m.  Subjective: No significant events overnight, patient still having some shortness of breath and productive cough.  Patient did not sleep well last night, no more hallucinations, anxiety is under control with Xanax.  Patient is very hard of hearing, denies any active issues, she would like to go home tomorrow.   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 Neurologic:  without any new focal findings Gait not checked due to patient safety concerns  Vitals:   03/01/23 0329 03/01/23 0424 03/01/23 0801 03/01/23 1131  BP: (!) 145/71  120/71 (!) 91/55  Pulse: 81  79 83  Resp: 16  20 20   Temp: 97.7 F (36.5 C)  97.6 F (36.4 C) 98.2 F (36.8 C)  TempSrc:    Oral  SpO2: 100%  99% 99%  Weight:  48.3 kg      Intake/Output Summary (Last 24 hours) at 03/01/2023 1448 Last data filed at 03/01/2023 1436 Gross per 24 hour  Intake 1680 ml  Output 1550 ml  Net 130 ml   Filed Weights   02/27/23 0522 02/28/23 0412 03/01/23 0424  Weight: 46.6 kg 46.4 kg 48.3 kg    Data Reviewed: I have personally reviewed and interpreted daily labs, tele strips, imagings as discussed above. I reviewed all nursing notes, pharmacy notes, vitals, pertinent old records I have discussed plan of care as described above with RN and patient/family.  CBC: Recent Labs  Lab 02/24/23 1106 02/25/23 0659 02/27/23 0518 02/28/23 0539 03/01/23 0450  WBC 9.4 8.0 6.5 6.1 5.3  NEUTROABS 8.8*  --   --   --   --   HGB 10.4* 9.3* 10.4* 9.6* 10.2*  HCT 34.8* 30.0* 33.2* 31.2* 34.1*  MCV 90.2 89.0 88.1 88.9 90.9  PLT 298 216 254 238 238   Basic Metabolic Panel: Recent Labs  Lab 02/24/23 1106 02/25/23 0659 02/26/23 0442 02/27/23 0518 02/28/23 0539 03/01/23 0450  NA 142 139  --  143 141 141  K 4.1 3.3*  --  3.5 3.8 3.3*  CL 102 108  --  109 107 105  CO2 21* 21*  --  28 29 29   GLUCOSE 90 143*  --  139* 149* 81  BUN 20 14  --  15 19 17   CREATININE 0.80 0.56  --  0.57 0.60 0.67  CALCIUM 8.9 8.1*  --  8.3* 8.5* 8.6*  MG  --   --  1.7 1.7 1.8 1.6*  PHOS  --   --  2.5 3.0 4.0 3.6    Studies: No results found.  Scheduled Meds:  amLODipine  5 mg Oral Daily   aspirin EC  81 mg Oral Daily   atorvastatin  10 mg Oral Daily   cyanocobalamin  1,000 mcg Intramuscular Daily   Followed by   Melene Muller[START ON 03/06/2023] vitamin B-12  1,000 mcg Oral Daily   enoxaparin (LOVENOX) injection  40 mg  Subcutaneous Q24H   feeding supplement  237 mL Oral BID BM   ipratropium-albuterol  3 mL Nebulization BID   levothyroxine  50 mcg Oral q morning   melatonin  5 mg Oral QHS   methylPREDNISolone (SOLU-MEDROL) injection  40 mg Intravenous Q12H   metoprolol tartrate  50 mg Oral BID   mometasone-formoterol  2 puff Inhalation BID   montelukast  10 mg Oral QHS   multivitamin with minerals  1 tablet Oral Daily   nicotine  21 mg Transdermal Daily   pantoprazole  40 mg Oral Daily   risperiDONE  0.25 mg Oral QHS   thiamine  100 mg Oral Daily   traZODone  300 mg Oral QHS   Vitamin D (Ergocalciferol)  50,000 Units Oral Q7 days   Continuous Infusions:  iron sucrose 300 mg (02/28/23 1116)   PRN Meds: acetaminophen, albuterol, ALPRAZolam, benzonatate, dextromethorphan-guaiFENesin, haloperidol lactate, hydrALAZINE, hydrOXYzine, metoprolol tartrate, ondansetron (ZOFRAN) IV, oxyCODONE  Time spent: 35 minutes  Author: Gillis Santa. MD [facility] 03/01/2023 2:48 PM  To reach On-call, see care teams to locate the attending and reach out to them via [email or web address]. If 7PM-7AM, please contact night-coverage If you still have difficulty reaching the attending provider, please page the [facility] (Director on Call) for [facility] on amion for assistance.

## 2023-03-01 NOTE — Plan of Care (Signed)
  Problem: Activity: Goal: Ability to tolerate increased activity will improve Outcome: Progressing Goal: Will verbalize the importance of balancing activity with adequate rest periods Outcome: Progressing   Problem: Respiratory: Goal: Ability to maintain a clear airway will improve Outcome: Progressing Goal: Levels of oxygenation will improve Outcome: Progressing Goal: Ability to maintain adequate ventilation will improve Outcome: Progressing   Problem: Clinical Measurements: Goal: Ability to maintain clinical measurements within normal limits will improve Outcome: Progressing Goal: Will remain free from infection Outcome: Progressing Goal: Diagnostic test results will improve Outcome: Progressing Goal: Respiratory complications will improve Outcome: Progressing Goal: Cardiovascular complication will be avoided Outcome: Progressing   Problem: Activity: Goal: Risk for activity intolerance will decrease Outcome: Progressing   Problem: Nutrition: Goal: Adequate nutrition will be maintained Outcome: Progressing   Problem: Coping: Goal: Level of anxiety will decrease Outcome: Progressing   Problem: Elimination: Goal: Will not experience complications related to bowel motility Outcome: Progressing Goal: Will not experience complications related to urinary retention Outcome: Progressing   Problem: Pain Managment: Goal: General experience of comfort will improve Outcome: Progressing   Problem: Safety: Goal: Ability to remain free from injury will improve Outcome: Progressing   Problem: Skin Integrity: Goal: Risk for impaired skin integrity will decrease Outcome: Progressing   Problem: Education: Goal: Knowledge of disease or condition will improve Outcome: Not Progressing Note: Patient experiencing confusion at this time. Will continue to reorient and educate as needed. Goal: Knowledge of the prescribed therapeutic regimen will improve Outcome: Not  Progressing Note: Patient experiencing confusion at this time. Will continue to reorient and educate as needed. Goal: Individualized Educational Video(s) Outcome: Not Progressing Note: Patient experiencing confusion at this time. Will continue to reorient and educate as needed.   Problem: Education: Goal: Knowledge of General Education information will improve Description: Including pain rating scale, medication(s)/side effects and non-pharmacologic comfort measures Outcome: Not Progressing Note: Patient experiencing confusion at this time. Will continue to reorient and educate as needed.   Problem: Health Behavior/Discharge Planning: Goal: Ability to manage health-related needs will improve Outcome: Not Progressing Note: Patient experiencing confusion at this time. Will continue to reorient and educate as needed.

## 2023-03-02 DIAGNOSIS — J441 Chronic obstructive pulmonary disease with (acute) exacerbation: Secondary | ICD-10-CM | POA: Diagnosis not present

## 2023-03-02 LAB — CBC
HCT: 31.8 % — ABNORMAL LOW (ref 36.0–46.0)
Hemoglobin: 9.8 g/dL — ABNORMAL LOW (ref 12.0–15.0)
MCH: 27.5 pg (ref 26.0–34.0)
MCHC: 30.8 g/dL (ref 30.0–36.0)
MCV: 89.3 fL (ref 80.0–100.0)
Platelets: 254 10*3/uL (ref 150–400)
RBC: 3.56 MIL/uL — ABNORMAL LOW (ref 3.87–5.11)
RDW: 15.9 % — ABNORMAL HIGH (ref 11.5–15.5)
WBC: 7 10*3/uL (ref 4.0–10.5)
nRBC: 0 % (ref 0.0–0.2)

## 2023-03-02 LAB — BASIC METABOLIC PANEL
Anion gap: 8 (ref 5–15)
BUN: 21 mg/dL (ref 8–23)
CO2: 27 mmol/L (ref 22–32)
Calcium: 8.2 mg/dL — ABNORMAL LOW (ref 8.9–10.3)
Chloride: 100 mmol/L (ref 98–111)
Creatinine, Ser: 0.61 mg/dL (ref 0.44–1.00)
GFR, Estimated: >60 mL/min (ref >60)
Glucose, Bld: 175 mg/dL — ABNORMAL HIGH (ref 70–99)
Potassium: 4.7 mmol/L (ref 3.5–5.1)
Sodium: 135 mmol/L (ref 135–145)

## 2023-03-02 LAB — MAGNESIUM: Magnesium: 1.9 mg/dL (ref 1.7–2.4)

## 2023-03-02 LAB — PHOSPHORUS: Phosphorus: 4.1 mg/dL (ref 2.5–4.6)

## 2023-03-02 MED ORDER — VITAMIN D (ERGOCALCIFEROL) 1.25 MG (50000 UNIT) PO CAPS: 50000.0000 [IU] | ORAL_CAPSULE | ORAL | 0 refills | Status: AC

## 2023-03-02 MED ORDER — ALPRAZOLAM 0.5 MG PO TABS: 1.0000 mg | ORAL_TABLET | Freq: Three times a day (TID) | ORAL | 0 refills | Status: DC | PRN

## 2023-03-02 MED ORDER — CYANOCOBALAMIN 1000 MCG PO TABS: 1000.0000 ug | ORAL_TABLET | Freq: Every day | ORAL | 0 refills | Status: AC

## 2023-03-02 MED ORDER — DM-GUAIFENESIN ER 30-600 MG PO TB12: 1.0000 | ORAL_TABLET | Freq: Two times a day (BID) | ORAL | 0 refills | Status: AC | PRN

## 2023-03-02 MED ORDER — METOPROLOL TARTRATE 25 MG PO TABS: 25.0000 mg | ORAL_TABLET | Freq: Two times a day (BID) | ORAL | 5 refills | Status: DC

## 2023-03-02 MED ORDER — BENZONATATE 100 MG PO CAPS: 100.0000 mg | ORAL_CAPSULE | Freq: Three times a day (TID) | ORAL | 0 refills | Status: DC | PRN

## 2023-03-02 MED ORDER — HYDROXYZINE HCL 25 MG PO TABS: 25.0000 mg | ORAL_TABLET | Freq: Three times a day (TID) | ORAL | 0 refills | Status: DC | PRN

## 2023-03-02 MED ORDER — RISPERIDONE 0.25 MG PO TABS: 0.2500 mg | ORAL_TABLET | Freq: Every day | ORAL | 2 refills | Status: DC

## 2023-03-02 MED ORDER — GUAIFENESIN ER 600 MG PO TB12: 600.0000 mg | ORAL_TABLET | Freq: Two times a day (BID) | ORAL | 0 refills | Status: AC

## 2023-03-02 MED ORDER — ACETAMINOPHEN 325 MG PO TABS: 650.0000 mg | ORAL_TABLET | Freq: Four times a day (QID) | ORAL | 0 refills | Status: AC | PRN

## 2023-03-02 MED ORDER — ASCORBIC ACID 500 MG PO TABS: 500.0000 mg | ORAL_TABLET | Freq: Every day | ORAL | 0 refills | Status: AC

## 2023-03-02 MED ORDER — PREDNISONE 20 MG PO TABS: ORAL_TABLET | ORAL | 0 refills | Status: AC

## 2023-03-02 MED ORDER — MELATONIN 5 MG PO TABS: 5.0000 mg | ORAL_TABLET | Freq: Every day | ORAL | 11 refills | Status: DC

## 2023-03-02 MED ORDER — POLYSACCHARIDE IRON COMPLEX 150 MG PO CAPS: 150.0000 mg | ORAL_CAPSULE | Freq: Every day | ORAL | 0 refills | Status: DC

## 2023-03-02 NOTE — TOC Progression Note (Signed)
Transition of Care [facility]) - Progression Note    Patient Details  Name: [name] MRN: [id number] Date of Birth: 17-Mar-1946  [facility]) CM/SW Contact  Truddie Hidden, RN Phone Number: 03/02/2023, 10:07 AM  Clinical Narrative:    Spoke with patient's daughter-in-law Ines Bloomer. She stated patient has personal caregiver that assist with care most of the day. Patients caregiver Cameron Sprang or Annice Pih will transport patient home at discharge.         Expected Discharge Plan and Services                                               Social Determinants of Health (SDOH) Interventions SDOH Screenings   Food Insecurity: No Food Insecurity (02/02/2023)  Housing: Low Risk  (02/02/2023)  Transportation Needs: No Transportation Needs (02/02/2023)  Utilities: Not At Risk (02/02/2023)  Depression (PHQ2-9): Low Risk  (07/13/2021)  Tobacco Use: High Risk (02/25/2023)    Readmission Risk Interventions    01/31/2023   11:49 AM 07/05/2022   10:50 AM 11/25/2021    9:50 AM  Readmission Risk Prevention Plan  Transportation Screening Complete Complete Complete  Medication Review [employer]) Complete Complete Complete  PCP or Specialist appointment within 3-5 days of discharge Complete  Complete  HRI or Home Care Consult  Complete Complete  SW Recovery Care/Counseling Consult Complete Complete   Palliative Care Screening Not Applicable  Not Applicable  Skilled Nursing Facility Not Applicable Not Applicable Not Applicable

## 2023-03-02 NOTE — Discharge Summary (Signed)
[facility] Discharge Summary   Patient: [name] PPI:[id number]  PCP: Alison Shaggy, MD  Date of admission: 02/24/2023   Date of discharge:  03/02/2023     Discharge Diagnoses:  Principal Problem:   COPD exacerbation Active Problems:   Acute metabolic encephalopathy   CAD (coronary artery disease)   Myocardial injury   Essential hypertension   HLD (hyperlipidemia)   Hypothyroidism   Tobacco use disorder   Anxiety and depression   Protein-calorie malnutrition, severe   Sinus tachycardia   Acute delirium   Admitted From: Home Disposition:  Home with [facility] services  Recommendations for Outpatient Follow-up:  F/u with PCP in 1 wk F/u Pulmo in 1-2 weeks Follow up LABS/TEST:     Diet recommendation: Cardiac diet  Activity: The patient is advised to gradually reintroduce usual activities, as tolerated  Discharge Condition: stable  Code Status: Full code   History of present illness: As per the H and P dictated on admission Hospital Course:  [name] is a 77 y.o. female with medical history significant of  COPD (on 2-3L oxygen), hypertension, hyperlipidemia, PVD, CAD, hypothyroidism, GERD, depression with anxiety, anemia, former smoker, medication noncompliance, who presents with shortness of breath and AMS.  ED workup: WBC 9.4, negative COVID PCR, negative urinalysis, GFR> 60, temperature normal, blood pressure 163/83, heart rate 143, RR 24, oxygen saturation 98% on 2 L oxygen.  Chest x-ray negative.  CT head negative.  ABG with pH 7.27, CO2 44, O2 59.7.  Patient is admitted to telemetry bed as inpatient.   Assessment and Plan:  # COPD exacerbation, Continue supplemental O2 inhalation S/p IV Solu-Medrol 40 every 12 hourly end on 4/11, started taper oral prednisone S/p Z-Pak completed course, continued Advair inhaler on discharge and Combivent Respimat, prescribed Mucinex 600 mg p.o. twice daily for 2 weeks, Robitussin DM as needed and Tessalon as needed for  cough.  COPD improved, patient was advised to follow with PCP and pulmonologist in 1 week. # Acute metabolic encephalopathy and acute delirium CT head negative. Patient has hallucination. Potential differential diagnosis include dementia with behavior change and psychosis. Started Risperdal 0.25 mg p.o. QHS and trazodone 300 mg nightly. Seen by psychiatry.  Patient takes Xanax at home 1 mg 3 times daily which was resumed with a low-dose 0.5 mg p.o. 3 times daily.  Advised to follow with PCP and psych as an outpatient.  Prescribed melatonin for insomnia # CAD, Hypertension, HLD, continue amlodipine, metoprolol, aspirin and statin Monitor BP at home and follow with PCP to titrate medications accordingly # Hypothyroid on Synthroid # Sinus tachycardia most likely due to anxiety, Heart rate up to 140s.  Seem to be sinus rhythm.  S/p IV fluid: 2.5 L normal saline. continue home metoprolol 50 mg twice daily. Resumed Xanax 0.5 mg p.o. 3 times daily (home dose 1 mg p.o. 3 times daily) # Hypokalemia, potassium repleted.  Resolved # Hypomagnesemia, mag repleted.  Resolved # Nicotine dependence, continue nicotine patch, follow with PCP # Insomnia, started melatonin, continued trazodone. # Anemia due to iron deficiency, transferrin saturation 5%.  Started Venofer 300 mg IV daily x 3 doses, started oral iron supplement.  Follow with PCP to repeat iron profile after 3 to 6 months. # Vitamin B12 level 278, goal >400, started vitamin B12 1000 mcg IM injection during hospital stay followed by oral supplement.  Repeat B12 level after 3 to 6 months # Vitamin D level 30, borderline low.  Started oral supplement.  Body  mass index is 18.13 kg/m.  Nutrition Problem: Severe Malnutrition Etiology:  (COPD) Nutrition Interventions: Interventions: Ensure Enlive (each supplement provides 350kcal and 20 grams of protein), MVI, Liberalize Diet  - Patient was instructed, not to drive, operate heavy machinery, perform  activities at heights, swimming or participation in water activities or provide baby sitting services while on Pain, Sleep and Anxiety Medications; until her outpatient Physician has advised to do so again.  - Also recommended to not to take more than prescribed Pain, Sleep and Anxiety Medications.  Patient was seen by physical therapy, who recommended Home health, which was arranged. On the day of the discharge the patient's vitals were stable, and no other acute medical condition were reported by patient. the patient was felt safe to be discharge at Home with Home health.  Consultants: Psychiatrist Procedures: None  Discharge Exam: General: Appear in no distress, no Rash; Oral Mucosa Clear, moist. Cardiovascular: S1 and S2 Present, no Murmur, Respiratory: normal respiratory effort, Bilateral Air entry present and no Crackles, no wheezes Abdomen: Bowel Sound present, Soft and no tenderness, no hernia Extremities: no Pedal edema, no calf tenderness Neurology: alert and oriented to time, place, and person affect appropriate.  Filed Weights   02/28/23 0412 03/01/23 0424 03/02/23 0113  Weight: 46.4 kg 48.3 kg 47.9 kg   Vitals:   03/02/23 0732 03/02/23 0910  BP:  132/70  Pulse:  80  Resp:  18  Temp:  98 F (36.7 C)  SpO2: 100% 96%    DISCHARGE MEDICATION: Allergies as of 03/02/2023   No Known Allergies      Medication List     STOP taking these medications    Eliquis 5 MG Tabs tablet Generic drug: apixaban   furosemide 40 MG tablet Commonly known as: LASIX       TAKE these medications    acetaminophen 325 MG tablet Commonly known as: TYLENOL Take 2 tablets (650 mg total) by mouth every 6 (six) hours as needed for mild pain or fever.   albuterol 108 (90 Base) MCG/ACT inhaler Commonly known as: VENTOLIN HFA Inhale 1-2 puffs into the lungs every 4 (four) hours as needed for shortness of breath or wheezing.   ALPRAZolam 0.5 MG tablet Commonly known as:  XANAX Take 2 tablets (1 mg total) by mouth 3 (three) times daily as needed for anxiety. What changed: medication strength   amLODipine 5 MG tablet Commonly known as: NORVASC Take 1 tablet (5 mg total) by mouth daily.   ammonium lactate 12 % lotion Commonly known as: LAC-HYDRIN APPLY TO AFFECTED AREA AS NEEDED FOR DRY SKIN   ascorbic acid 500 MG tablet Commonly known as: VITAMIN C Take 1 tablet (500 mg total) by mouth daily. Start taking on: March 03, 2023   Aspirin Low Dose 81 MG tablet Generic drug: aspirin EC Take 81 mg by mouth daily.   atorvastatin 10 MG tablet Commonly known as: Lipitor Take 1 tablet (10 mg total) by mouth daily.   benzonatate 100 MG capsule Commonly known as: TESSALON Take 1 capsule (100 mg total) by mouth 3 (three) times daily as needed for cough.   Combivent Respimat 20-100 MCG/ACT Aers respimat Generic drug: Ipratropium-Albuterol Inhale 1-2 puffs into the lungs 4 (four) times daily.   cyanocobalamin 1000 MCG tablet Take 1 tablet (1,000 mcg total) by mouth daily. Start taking on: March 06, 2023   dextromethorphan-guaiFENesin 30-600 MG 12hr tablet Commonly known as: MUCINEX DM Take 1 tablet by mouth 2 (two) times daily as  needed for up to 7 days for cough.   feeding supplement Liqd Take 237 mLs by mouth 3 (three) times daily between meals.   fluticasone-salmeterol 250-50 MCG/ACT Aepb Commonly known as: ADVAIR Inhale 1 puff into the lungs 2 (two) times daily as needed.   guaiFENesin 600 MG 12 hr tablet Commonly known as: Mucinex Take 1 tablet (600 mg total) by mouth 2 (two) times daily for 14 days.   hydrOXYzine 25 MG tablet Commonly known as: ATARAX Take 1 tablet (25 mg total) by mouth 3 (three) times daily as needed for anxiety.   iron polysaccharides 150 MG capsule Commonly known as: NIFEREX Take 1 capsule (150 mg total) by mouth daily. Start taking on: March 03, 2023   levothyroxine 50 MCG tablet Commonly known as:  SYNTHROID Take 50 mcg by mouth every morning.   melatonin 5 MG Tabs Take 1 tablet (5 mg total) by mouth at bedtime.   metoprolol tartrate 25 MG tablet Commonly known as: LOPRESSOR Take 1 tablet (25 mg total) by mouth 2 (two) times daily. Skip the dose if systolic BP less than 130 mmHg and/or heart rate less than 65 beats per minute What changed:  medication strength how much to take additional instructions   montelukast 10 MG tablet Commonly known as: SINGULAIR Take 10 mg by mouth at bedtime.   multivitamin with minerals Tabs tablet Take 1 tablet by mouth daily.   omeprazole 40 MG capsule Commonly known as: PRILOSEC Take 40 mg by mouth daily.   predniSONE 20 MG tablet Commonly known as: DELTASONE Take 2 tablets (40 mg total) by mouth daily with breakfast for 2 days, THEN 1.5 tablets (30 mg total) daily with breakfast for 3 days, THEN 1 tablet (20 mg total) daily with breakfast for 3 days, THEN 0.5 tablets (10 mg total) daily with breakfast for 3 days. Start taking on: March 02, 2023   risperiDONE 0.25 MG tablet Commonly known as: RISPERDAL Take 1 tablet (0.25 mg total) by mouth at bedtime.   thiamine 100 MG tablet Commonly known as: Vitamin B-1 Take 1 tablet (100 mg total) by mouth daily.   traZODone 150 MG tablet Commonly known as: DESYREL Take 300 mg by mouth at bedtime.   Vitamin D (Ergocalciferol) 1.25 MG (50000 UNIT) Caps capsule Commonly known as: DRISDOL Take 1 capsule (50,000 Units total) by mouth every 7 (seven) days. Start taking on: March 07, 2023       No Known Allergies Discharge Instructions     Call MD for:  difficulty breathing, headache or visual disturbances   Complete by: As directed    Call MD for:  extreme fatigue   Complete by: As directed    Call MD for:  persistant dizziness or light-headedness   Complete by: As directed    Call MD for:  severe uncontrolled pain   Complete by: As directed    Call MD for:  temperature >100.4    Complete by: As directed    Diet - low sodium heart healthy   Complete by: As directed    Discharge instructions   Complete by: As directed    F/u with PCP in 1 wk F/u Pulmo in 1-2 weeks   Increase activity slowly   Complete by: As directed        The results of significant diagnostics from this hospitalization (including imaging, microbiology, ancillary and laboratory) are listed below for reference.    Significant Diagnostic Studies: ECHOCARDIOGRAM COMPLETE  Result Date: 02/28/2023  ECHOCARDIOGRAM REPORT   Patient Name:   [name] Date of Exam: 02/27/2023 Medical Rec #:  [id number]      Height:       64.0 in Accession #:    [id number]     Weight:       102.7 lb Date of Birth:  14-Aug-1946      BSA:          1.474 m Patient Age:    76 years       BP:           153/92 mmHg Patient Gender: F              HR:           107 bpm. Exam Location:  [facility] Procedure: 2D Echo, Cardiac Doppler and Color Doppler Indications:     R00.8 Other abnormalities of the heart  History:         Patient has prior history of Echocardiogram examinations, most                  recent 01/17/2021. CAD, COPD, Signs/Symptoms:Dyspnea; Risk                  Factors:Hypertension. Hypothyroidism. Peripheral vascular                  disease.  Sonographer:     Daphine Deutscher RDCS Referring Phys:  [id number] Gillis Santa Diagnosing Phys: Yvonne Kendall MD IMPRESSIONS  1. Left ventricular ejection fraction, by estimation, is 55 to 60%. The left ventricle has normal function. Left ventricular endocardial border not optimally defined to evaluate regional wall motion. Indeterminate diastolic filling due to E-A fusion.  2. Right ventricular systolic function is normal. The right ventricular size is normal. Tricuspid regurgitation signal is inadequate for assessing PA pressure.  3. The mitral valve is grossly normal. Trivial mitral valve regurgitation. No evidence of mitral stenosis.  4. The aortic valve has an indeterminant  number of cusps. There is mild thickening of the aortic valve. Aortic valve regurgitation is not visualized. No aortic stenosis is present.  5. The inferior vena cava is normal in size with greater than 50% respiratory variability, suggesting right atrial pressure of 3 mmHg. FINDINGS  Left Ventricle: Left ventricular ejection fraction, by estimation, is 55 to 60%. The left ventricle has normal function. Left ventricular endocardial border not optimally defined to evaluate regional wall motion. The left ventricular internal cavity size was normal in size. There is no left ventricular hypertrophy. Indeterminate diastolic filling due to E-A fusion. Right Ventricle: The right ventricular size is normal. No increase in right ventricular wall thickness. Right ventricular systolic function is normal. Tricuspid regurgitation signal is inadequate for assessing PA pressure. Left Atrium: Left atrial size was normal in size. Right Atrium: Right atrial size was normal in size. Pericardium: There is no evidence of pericardial effusion. Mitral Valve: The mitral valve is grossly normal. Trivial mitral valve regurgitation. No evidence of mitral valve stenosis. Tricuspid Valve: The tricuspid valve is grossly normal. Tricuspid valve regurgitation is not demonstrated. Aortic Valve: The aortic valve has an indeterminant number of cusps. There is mild thickening of the aortic valve. Aortic valve regurgitation is not visualized. No aortic stenosis is present. Aortic valve mean gradient measures 4.4 mmHg. Aortic valve peak gradient measures 7.8 mmHg. Aortic valve area, by VTI measures 2.45 cm. Pulmonic Valve: The pulmonic valve was not well visualized. Pulmonic valve regurgitation is not visualized. No evidence of pulmonic stenosis.  Aorta: The aortic root is normal in size and structure. Pulmonary Artery: The pulmonary artery is not well seen. Venous: The inferior vena cava is normal in size with greater than 50% respiratory variability,  suggesting right atrial pressure of 3 mmHg. IAS/Shunts: The interatrial septum was not well visualized.  LEFT VENTRICLE PLAX 2D LVIDd:         3.90 cm   Diastology LVIDs:         2.90 cm   LV e' medial:    7.07 cm/s LV PW:         0.90 cm   LV E/e' medial:  8.8 LV IVS:        0.90 cm   LV e' lateral:   7.62 cm/s LVOT diam:     1.80 cm   LV E/e' lateral: 8.1 LV SV:         51 LV SV Index:   35 LVOT Area:     2.54 cm  RIGHT VENTRICLE             IVC RV Basal diam:  3.30 cm     IVC diam: 1.10 cm RV S prime:     18.85 cm/s TAPSE (M-mode): 1.4 cm LEFT ATRIUM             Index        RIGHT ATRIUM           Index LA diam:        3.50 cm 2.37 cm/m   RA Area:     12.10 cm LA Vol (A2C):   35.2 ml 23.88 ml/m  RA Volume:   29.10 ml  19.74 ml/m LA Vol (A4C):   48.3 ml 32.77 ml/m LA Biplane Vol: 41.3 ml 28.02 ml/m  AORTIC VALVE AV Area (Vmax):    2.04 cm AV Area (Vmean):   1.84 cm AV Area (VTI):     2.45 cm AV Vmax:           139.56 cm/s AV Vmean:          99.445 cm/s AV VTI:            0.210 m AV Peak Grad:      7.8 mmHg AV Mean Grad:      4.4 mmHg LVOT Vmax:         111.66 cm/s LVOT Vmean:        71.730 cm/s LVOT VTI:          0.202 m LVOT/AV VTI ratio: 0.96  AORTA Ao Root diam: 3.10 cm MITRAL VALVE MV Area (PHT): 6.24 cm     SHUNTS MV Decel Time: 122 msec     Systemic VTI:  0.20 m MV E velocity: 61.95 cm/s   Systemic Diam: 1.80 cm MV A velocity: 109.50 cm/s MV E/A ratio:  0.57 Cristal Deer End MD Electronically signed by Yvonne Kendall MD Signature Date/Time: 02/28/2023/6:53:40 AM    Final    CT Head Wo Contrast  Result Date: 02/24/2023 CLINICAL DATA:  Mental status change, unknown cause EXAM: CT HEAD WITHOUT CONTRAST TECHNIQUE: Contiguous axial images were obtained from the base of the skull through the vertex without intravenous contrast. RADIATION DOSE REDUCTION: This exam was performed according to the departmental dose-optimization program which includes automated exposure control, adjustment of the mA and/or  kV according to patient size and/or use of iterative reconstruction technique. COMPARISON:  11/08/2022 FINDINGS: Brain: No evidence of acute infarction, hemorrhage, hydrocephalus, extra-axial collection or mass lesion/mass effect. Moderate  low-density changes within the periventricular and subcortical white matter most compatible with chronic microvascular ischemic change. Mild diffuse cerebral volume loss. Vascular: Atherosclerotic calcifications involving the large vessels of the skull base. No unexpected hyperdense vessel. Skull: Normal. Negative for fracture or focal lesion. Sinuses/Orbits: No acute finding. Other: None. IMPRESSION: 1. No acute intracranial findings. 2. Chronic microvascular ischemic change and cerebral volume loss. Electronically Signed   By: Duanne Guess D.O.   On: 02/24/2023 14:10   DG Chest Port 1 View  Result Date: 02/24/2023 CLINICAL DATA:  Skin fold EXAM: PORTABLE CHEST 1 VIEW COMPARISON:  Same day chest x-ray FINDINGS: Frontal chest x-ray obtained after patient repositioning. Previously seen skin fold is no longer evident. There is no pneumothorax. No new focal airspace consolidation. Stable heart size. IMPRESSION: No pneumothorax. Electronically Signed   By: Duanne Guess D.O.   On: 02/24/2023 12:06   DG Chest Port 1 View  Result Date: 02/24/2023 CLINICAL DATA:  Shortness of breath EXAM: PORTABLE CHEST 1 VIEW COMPARISON:  01/30/2023 FINDINGS: Stable cardiomediastinal contours. Aortic atherosclerosis. No focal airspace consolidation. Presumed skin fold at the periphery of the left lung base. Chronically coarsened interstitial markings bilaterally. No focal consolidation. No pleural effusion. No right-sided pneumothorax. IMPRESSION: 1. Presumed skin fold at the periphery of the left lung base. Repeat frontal chest radiograph after patient repositioning is recommended to exclude a small left pneumothorax. 2. Chronic lung changes without focal airspace consolidation.  Electronically Signed   By: Duanne Guess D.O.   On: 02/24/2023 11:37   DG ESOPHAGUS W DOUBLE CM (HD)  Result Date: 02/02/2023 CLINICAL DATA:  Patient with complaints of GERD, regurgitation and the feeling of food stuck in her throat. EXAM: ESOPHAGUS/BARIUM SWALLOW/TABLET STUDY TECHNIQUE: Single contrast examination was performed using thin liquid barium. This exam was performed by Alwyn Ren, NP, and was supervised and interpreted by Dr. Celine Mans. FLUOROSCOPY: Radiation Exposure Index (as provided by the fluoroscopic device): 2.20 mGy Kerma COMPARISON:  None Available. FINDINGS: Swallowing: Appears normal. No vestibular penetration or aspiration seen. Pharynx: Unremarkable. Esophagus: Normal appearance.  No stricture observed. Esophageal motility: Dysmotility with tertiary contractions. Hiatal Hernia: Small hiatal hernia observed Gastroesophageal reflux: None visualized. Ingested 13mm barium tablet: Not given Other: None. IMPRESSION: Limited study due to patient's inability to participate in study. Dysmotility with tertiary contractions observed. Small hiatal hernia observed. Performed by: Alwyn Ren, NP Electronically Signed   By: Lorenza Cambridge M.D.   On: 02/02/2023 16:53    Microbiology: Recent Results (from the past 240 hour(s))  Culture, blood (routine x 2)     Status: None   Collection Time: 02/24/23 11:05 AM   Specimen: BLOOD  Result Value Ref Range Status   Specimen Description BLOOD BLOOD RIGHT ARM  Final   Special Requests   Final    BOTTLES DRAWN AEROBIC AND ANAEROBIC Blood Culture results may not be optimal due to an excessive volume of blood received in culture bottles   Culture   Final    NO GROWTH 5 DAYS Performed at [facility], [address], [place], [place] [address]    Report Status 03/01/2023 FINAL  Final  Culture, blood (routine x 2)     Status: None   Collection Time: 02/24/23 11:06 AM   Specimen: BLOOD  Result Value Ref Range Status   Specimen  Description BLOOD RIGHT ANTECUBITAL  Final   Special Requests   Final    BOTTLES DRAWN AEROBIC AND ANAEROBIC Blood Culture results may not be optimal due  to an inadequate volume of blood received in culture bottles   Culture   Final    NO GROWTH 5 DAYS Performed at [facility], [address] [place], [place] [address]    Report Status 03/01/2023 FINAL  Final  Resp panel by RT-PCR (RSV, Flu A&B, Covid) Anterior Nasal Swab     Status: None   Collection Time: 02/24/23 12:07 PM   Specimen: Anterior Nasal Swab  Result Value Ref Range Status   SARS Coronavirus 2 by RT PCR NEGATIVE NEGATIVE Final    Comment: (NOTE) SARS-CoV-2 target nucleic acids are NOT DETECTED.  The SARS-CoV-2 RNA is generally detectable in upper respiratory specimens during the acute phase of infection. The lowest concentration of SARS-CoV-2 viral copies this assay can detect is 138 copies/mL. A negative result does not preclude SARS-Cov-2 infection and should not be used as the sole basis for treatment or other patient management decisions. A negative result may occur with  improper specimen collection/handling, submission of specimen other than nasopharyngeal swab, presence of viral mutation(s) within the areas targeted by this assay, and inadequate number of viral copies(<138 copies/mL). A negative result must be combined with clinical observations, patient history, and epidemiological information. The expected result is Negative.  Fact Sheet for Patients:  BloggerCourse.com  Fact Sheet for Healthcare Providers:  SeriousBroker.it  This test is no t yet approved or cleared by the [place] FDA and  has been authorized for detection and/or diagnosis of SARS-CoV-2 by FDA under an Emergency Use Authorization (EUA). This EUA will remain  in effect (meaning this test can be used) for the duration of the COVID-19 declaration under Section 564(b)(1) of  the Act, 21 U.S.C.section 360bbb-3(b)(1), unless the authorization is terminated  or revoked sooner.       Influenza A by PCR NEGATIVE NEGATIVE Final   Influenza B by PCR NEGATIVE NEGATIVE Final    Comment: (NOTE) The Xpert Xpress SARS-CoV-2/FLU/RSV plus assay is intended as an aid in the diagnosis of influenza from Nasopharyngeal swab specimens and should not be used as a sole basis for treatment. Nasal washings and aspirates are unacceptable for Xpert Xpress SARS-CoV-2/FLU/RSV testing.  Fact Sheet for Patients: BloggerCourse.com  Fact Sheet for Healthcare Providers: SeriousBroker.it  This test is not yet approved or cleared by the [place] FDA and has been authorized for detection and/or diagnosis of SARS-CoV-2 by FDA under an Emergency Use Authorization (EUA). This EUA will remain in effect (meaning this test can be used) for the duration of the COVID-19 declaration under Section 564(b)(1) of the Act, 21 U.S.C. section 360bbb-3(b)(1), unless the authorization is terminated or revoked.     Resp Syncytial Virus by PCR NEGATIVE NEGATIVE Final    Comment: (NOTE) Fact Sheet for Patients: BloggerCourse.com  Fact Sheet for Healthcare Providers: SeriousBroker.it  This test is not yet approved or cleared by the [place] FDA and has been authorized for detection and/or diagnosis of SARS-CoV-2 by FDA under an Emergency Use Authorization (EUA). This EUA will remain in effect (meaning this test can be used) for the duration of the COVID-19 declaration under Section 564(b)(1) of the Act, 21 U.S.C. section 360bbb-3(b)(1), unless the authorization is terminated or revoked.  Performed at [facility], [address] [place], [place] [address]   Expectorated Sputum Assessment w Gram Stain, Rflx to Resp Cult     Status: None   Collection Time: 02/26/23  2:15 PM    Specimen: Sputum  Result Value Ref Range Status  Specimen Description SPUTUM  Final   Special Requests NONE  Final   Sputum evaluation   Final    THIS SPECIMEN IS ACCEPTABLE FOR SPUTUM CULTURE Performed at [facility], [address] [place], [place] [address]    Report Status 02/26/2023 FINAL  Final  Culture, Respiratory w Gram Stain     Status: None   Collection Time: 02/26/23  2:15 PM   Specimen: SPU  Result Value Ref Range Status   Specimen Description   Final    SPUTUM Performed at [facility], [address] [place], [place] [address]    Special Requests   Final    NONE Reflexed from [phone] Performed at [facility], [address] [place], [place] [address]    Gram Stain   Final    RARE WBC PRESENT,BOTH PMN AND MONONUCLEAR FEW GRAM NEGATIVE RODS FEW GRAM POSITIVE COCCI Performed at [facility], [address] [place], [place] [address]    Culture ABUNDANT PSEUDOMONAS AERUGINOSA  Final   Report Status 03/01/2023 FINAL  Final   Organism ID, Bacteria PSEUDOMONAS AERUGINOSA  Final      Susceptibility   Pseudomonas aeruginosa - MIC*    CEFTAZIDIME >=64 RESISTANT Resistant     CIPROFLOXACIN 1 INTERMEDIATE Intermediate     GENTAMICIN <=1 SENSITIVE Sensitive     PIP/TAZO 16 SENSITIVE Sensitive     * ABUNDANT PSEUDOMONAS AERUGINOSA     Labs: CBC: Recent Labs  Lab 02/24/23 1106 02/25/23 0659 02/27/23 0518 02/28/23 0539 03/01/23 0450 03/02/23 0352  WBC 9.4 8.0 6.5 6.1 5.3 7.0  NEUTROABS 8.8*  --   --   --   --   --   HGB 10.4* 9.3* 10.4* 9.6* 10.2* 9.8*  HCT 34.8* 30.0* 33.2* 31.2* 34.1* 31.8*  MCV 90.2 89.0 88.1 88.9 90.9 89.3  PLT 298 216 254 238 238 254   Basic Metabolic Panel: Recent Labs  Lab 02/25/23 0659 02/26/23 0442 02/27/23 0518 02/28/23 0539 03/01/23 0450 03/02/23 0352  NA 139  --  143 141 141 135  K 3.3*  --  3.5 3.8 3.3* 4.7  CL 108  --  109 107 105 100  CO2 21*  --  28 29 29 27   GLUCOSE 143*  --   139* 149* 81 175*  BUN 14  --  15 19 17 21   CREATININE 0.56  --  0.57 0.60 0.67 0.61  CALCIUM 8.1*  --  8.3* 8.5* 8.6* 8.2*  MG  --  1.7 1.7 1.8 1.6* 1.9  PHOS  --  2.5 3.0 4.0 3.6 4.1   Liver Function Tests: Recent Labs  Lab 02/24/23 1106  AST 21  ALT 17  ALKPHOS 83  BILITOT 1.7*  PROT 7.1  ALBUMIN 3.7   No results for input(s): "LIPASE", "AMYLASE" in the last 168 hours. No results for input(s): "AMMONIA" in the last 168 hours. Cardiac Enzymes: No results for input(s): "CKTOTAL", "CKMB", "CKMBINDEX", "TROPONINI" in the last 168 hours. BNP (last 3 results) Recent Labs    01/10/23 2314 01/30/23 0740 02/24/23 1106  BNP 70.6 415.9* 51.2   CBG: No results for input(s): "GLUCAP" in the last 168 hours.  Time spent: 35 minutes  Signed:  Gillis Santa  [facility]  03/02/2023 11:42 AM

## 2023-03-02 NOTE — Care Management Important Message (Addendum)
Important Message  Patient Details  Name: [name] MRN: [id number] Date of Birth: Feb 18, 1946   Medicare Important Message Given:  Yes  Patient asleep upon time of visit, no family in room.  Medicare IM left in room for reference.   Johnell Comings 03/02/2023, 11:34 AM

## 2023-03-13 ENCOUNTER — Emergency Department
Admission: EM | Admit: 2023-03-13 | Discharge: 2023-03-13 | Disposition: A | Discharge status: Home / Self Care | Payer: Medicare HMO | Attending: Emergency Medicine | Admitting: Emergency Medicine

## 2023-03-13 ENCOUNTER — Emergency Department: Payer: Medicare HMO

## 2023-03-13 ENCOUNTER — Other Ambulatory Visit: Payer: Self-pay

## 2023-03-13 DIAGNOSIS — Z87891 Personal history of nicotine dependence: Secondary | ICD-10-CM | POA: Insufficient documentation

## 2023-03-13 DIAGNOSIS — R0602 Shortness of breath: Secondary | ICD-10-CM

## 2023-03-13 DIAGNOSIS — E860 Dehydration: Secondary | ICD-10-CM | POA: Diagnosis not present

## 2023-03-13 DIAGNOSIS — J449 Chronic obstructive pulmonary disease, unspecified: Secondary | ICD-10-CM | POA: Insufficient documentation

## 2023-03-13 DIAGNOSIS — I10 Essential (primary) hypertension: Secondary | ICD-10-CM | POA: Insufficient documentation

## 2023-03-13 DIAGNOSIS — R0600 Dyspnea, unspecified: Secondary | ICD-10-CM | POA: Diagnosis not present

## 2023-03-13 LAB — COMPREHENSIVE METABOLIC PANEL
ALT: 18 U/L (ref 0–44)
AST: 20 U/L (ref 15–41)
Albumin: 3.6 g/dL (ref 3.5–5.0)
Alkaline Phosphatase: 60 U/L (ref 38–126)
Anion gap: 8 (ref 5–15)
BUN: 17 mg/dL (ref 8–23)
CO2: 27 mmol/L (ref 22–32)
Calcium: 8.5 mg/dL — ABNORMAL LOW (ref 8.9–10.3)
Chloride: 104 mmol/L (ref 98–111)
Creatinine, Ser: 0.61 mg/dL (ref 0.44–1.00)
GFR, Estimated: >60 mL/min (ref >60)
Glucose, Bld: 97 mg/dL (ref 70–99)
Potassium: 3.7 mmol/L (ref 3.5–5.1)
Sodium: 139 mmol/L (ref 135–145)
Total Bilirubin: 1 mg/dL (ref 0.3–1.2)
Total Protein: 6.3 g/dL — ABNORMAL LOW (ref 6.5–8.1)

## 2023-03-13 LAB — CBC
HCT: 38.5 % (ref 36.0–46.0)
Hemoglobin: 12.2 g/dL (ref 12.0–15.0)
MCH: 28.5 pg (ref 26.0–34.0)
MCHC: 31.7 g/dL (ref 30.0–36.0)
MCV: 90 fL (ref 80.0–100.0)
Platelets: 347 10*3/uL (ref 150–400)
RBC: 4.28 MIL/uL (ref 3.87–5.11)
RDW: 17.2 % — ABNORMAL HIGH (ref 11.5–15.5)
WBC: 11.2 10*3/uL — ABNORMAL HIGH (ref 4.0–10.5)
nRBC: 0 % (ref 0.0–0.2)

## 2023-03-13 LAB — TROPONIN I (HIGH SENSITIVITY): Troponin I (High Sensitivity): 10 ng/L (ref <18)

## 2023-03-13 MED ORDER — SODIUM CHLORIDE 0.9 % IV BOLUS
1000.0000 mL | Freq: Once | INTRAVENOUS | Status: AC
  Administered 2023-03-13: 1000 mL via INTRAVENOUS

## 2023-03-13 MED ORDER — IPRATROPIUM-ALBUTEROL 0.5-2.5 (3) MG/3ML IN SOLN
3.0000 mL | Freq: Once | RESPIRATORY_TRACT | Status: AC
  Administered 2023-03-13: 3 mL via RESPIRATORY_TRACT
  Filled 2023-03-13: qty 3

## 2023-03-13 NOTE — Discharge Instructions (Addendum)
Ensuring plenty of fluids over the next 24 hours.  Please follow-up with your doctor within the next 2 days for recheck/reevaluation.  Return to the emergency department for any significant shortness of breath or any other symptom personally concerning to yourself.

## 2023-03-13 NOTE — ED Provider Notes (Signed)
[facility] Provider Note    Event Date/Time   First MD Initiated Contact with Patient 03/13/23 1233     (approximate)  History   Chief Complaint: Shortness of Breath  HPI  Mar Zettler is a 77 y.o. female with a past medical history of anxiety, COPD, hypertension, presents to the emergency department from home for shortness of breath.  Patient states she had been recently at [facility], went home 5 days ago and since has required coming to the emergency department twice over the past 5 days due to shortness of breath.  Patient states today that is not somewhat shortness of breath but more she feels very dehydrated.  Patient noted to be tachycardic around 120 bpm on arrival.  States occasional cough but states this is chronic due to Buerger's disease.  Patient states long smoking history but quit years ago.  Denies any chest pain.  Physical Exam   Triage Vital Signs: ED Triage Vitals  Enc Vitals Group     BP 03/13/23 1254 (!) 156/100     Pulse Rate 03/13/23 1239 (!) 129     Resp 03/13/23 1239 (!) 24     Temp 03/13/23 1254 97.7 F (36.5 C)     Temp Source 03/13/23 1254 Oral     SpO2 03/13/23 1239 100 %     Weight --      Height 03/13/23 1238  (1.626 m)     Head Circumference --      Peak Flow --      Pain Score 03/13/23 1238 6     Pain Loc --      Pain Edu? --      Excl. in GC? --     Most recent vital signs: Vitals:   03/13/23 1254 03/13/23 1300  BP: (!) 156/100 (!) 148/82  Pulse: (!) 135 (!) 119  Resp:  (!) 29  Temp: 97.7 F (36.5 C)   SpO2: 94% 96%    General: Awake, no distress.  CV:  Good peripheral perfusion.  Regular rate and rhythm  Resp:  Normal effort.  Equal breath sounds bilaterally.  Mild expiratory wheeze. Abd:  No distention.  Soft, nontender.  No rebound or guarding. Other:  No lower extremity edema noted   ED Results / Procedures / Treatments   EKG  EKG viewed and interpreted by myself  shows sinus tachycardia 123 bpm with a narrow QRS, normal axis, normal intervals, no concerning ST changes.  RADIOLOGY  Chest x-ray viewed and interpreted by myself shows no obvious consolidation on my evaluation. Radiology has read the x-ray as hyperinflation with no acute process.   MEDICATIONS ORDERED IN ED: Medications  sodium chloride 0.9 % bolus 1,000 mL (has no administration in time range)  sodium chloride 0.9 % bolus 1,000 mL (1,000 mLs Intravenous New Bag/Given 03/13/23 1307)     IMPRESSION / MDM / ASSESSMENT AND PLAN / ED COURSE  I reviewed the triage vital signs and the nursing notes.  Patient's presentation is most consistent with acute presentation with potential threat to life or bodily function.  Patient presents emergency department for reported shortness of breath.  Here the patient states is not somewhat shortness of breath she feels very dehydrated.  We will dose IV fluids.  We will dose a DuoNeb given the mild expiratory wheeze on exam although the patient states this is chronic.  Will obtain a chest x-ray labs including cardiac enzymes.  Patient agreeable to  plan.  Patient's workup is overall reassuring chest x-ray is clear.  Troponin is reassuringly negative, CBC shows no concerning findings, chemistry is reassuring with normal renal function.  Patient has received IV fluids and she is feeling better.  FINAL CLINICAL IMPRESSION(S) / ED DIAGNOSES   Dyspnea Dehydration   Note:  This document was prepared using Dragon voice recognition software and may include unintentional dictation errors.   Minna Antis, MD 03/13/23 1429

## 2023-03-13 NOTE — ED Triage Notes (Signed)
Patient to ED from home, caregiver called out for SOB. Recently prescribed lasix but refused to take it. EMS administered 2.5 Albuterol.

## 2023-03-16 ENCOUNTER — Ambulatory Visit: Payer: Medicare HMO | Admitting: Podiatry

## 2023-03-22 ENCOUNTER — Ambulatory Visit: Payer: Medicare HMO | Admitting: Podiatry

## 2023-05-05 ENCOUNTER — Other Ambulatory Visit (INDEPENDENT_AMBULATORY_CARE_PROVIDER_SITE_OTHER): Payer: Self-pay | Admitting: Vascular Surgery

## 2023-05-06 ENCOUNTER — Other Ambulatory Visit: Payer: Self-pay

## 2023-05-06 ENCOUNTER — Encounter: Payer: Self-pay | Admitting: Internal Medicine

## 2023-05-06 ENCOUNTER — Emergency Department: Payer: Medicare HMO

## 2023-05-06 ENCOUNTER — Inpatient Hospital Stay
Admission: EM | Admit: 2023-05-06 | Discharge: 2023-05-21 | Disposition: A | Discharge status: Skilled Nursing Facility | Payer: Medicare HMO | Attending: Internal Medicine | Admitting: Internal Medicine

## 2023-05-06 DIAGNOSIS — Z981 Arthrodesis status: Secondary | ICD-10-CM

## 2023-05-06 DIAGNOSIS — I2510 Atherosclerotic heart disease of native coronary artery without angina pectoris: Secondary | ICD-10-CM | POA: Diagnosis present

## 2023-05-06 DIAGNOSIS — K219 Gastro-esophageal reflux disease without esophagitis: Secondary | ICD-10-CM | POA: Diagnosis present

## 2023-05-06 DIAGNOSIS — F410 Panic disorder [episodic paroxysmal anxiety] without agoraphobia: Secondary | ICD-10-CM | POA: Diagnosis present

## 2023-05-06 DIAGNOSIS — E43 Unspecified severe protein-calorie malnutrition: Secondary | ICD-10-CM | POA: Diagnosis present

## 2023-05-06 DIAGNOSIS — F17210 Nicotine dependence, cigarettes, uncomplicated: Secondary | ICD-10-CM | POA: Diagnosis present

## 2023-05-06 DIAGNOSIS — F0394 Unspecified dementia, unspecified severity, with anxiety: Secondary | ICD-10-CM | POA: Diagnosis present

## 2023-05-06 DIAGNOSIS — Z811 Family history of alcohol abuse and dependence: Secondary | ICD-10-CM

## 2023-05-06 DIAGNOSIS — E039 Hypothyroidism, unspecified: Secondary | ICD-10-CM | POA: Diagnosis present

## 2023-05-06 DIAGNOSIS — Z89421 Acquired absence of other right toe(s): Secondary | ICD-10-CM

## 2023-05-06 DIAGNOSIS — Z8711 Personal history of peptic ulcer disease: Secondary | ICD-10-CM

## 2023-05-06 DIAGNOSIS — Z9049 Acquired absence of other specified parts of digestive tract: Secondary | ICD-10-CM

## 2023-05-06 DIAGNOSIS — F419 Anxiety disorder, unspecified: Secondary | ICD-10-CM | POA: Diagnosis present

## 2023-05-06 DIAGNOSIS — J441 Chronic obstructive pulmonary disease with (acute) exacerbation: Principal | ICD-10-CM | POA: Diagnosis present

## 2023-05-06 DIAGNOSIS — G894 Chronic pain syndrome: Secondary | ICD-10-CM | POA: Diagnosis present

## 2023-05-06 DIAGNOSIS — M79671 Pain in right foot: Secondary | ICD-10-CM | POA: Diagnosis not present

## 2023-05-06 DIAGNOSIS — F03918 Unspecified dementia, unspecified severity, with other behavioral disturbance: Secondary | ICD-10-CM | POA: Diagnosis present

## 2023-05-06 DIAGNOSIS — E785 Hyperlipidemia, unspecified: Secondary | ICD-10-CM | POA: Diagnosis present

## 2023-05-06 DIAGNOSIS — F32A Depression, unspecified: Secondary | ICD-10-CM | POA: Diagnosis present

## 2023-05-06 DIAGNOSIS — Z79890 Hormone replacement therapy (postmenopausal): Secondary | ICD-10-CM

## 2023-05-06 DIAGNOSIS — R64 Cachexia: Secondary | ICD-10-CM | POA: Diagnosis present

## 2023-05-06 DIAGNOSIS — F0392 Unspecified dementia, unspecified severity, with psychotic disturbance: Secondary | ICD-10-CM | POA: Diagnosis present

## 2023-05-06 DIAGNOSIS — I10 Essential (primary) hypertension: Secondary | ICD-10-CM | POA: Diagnosis present

## 2023-05-06 DIAGNOSIS — Z79899 Other long term (current) drug therapy: Secondary | ICD-10-CM

## 2023-05-06 DIAGNOSIS — R627 Adult failure to thrive: Secondary | ICD-10-CM | POA: Diagnosis present

## 2023-05-06 DIAGNOSIS — E876 Hypokalemia: Secondary | ICD-10-CM | POA: Diagnosis not present

## 2023-05-06 DIAGNOSIS — J9621 Acute and chronic respiratory failure with hypoxia: Secondary | ICD-10-CM | POA: Diagnosis present

## 2023-05-06 DIAGNOSIS — Z9981 Dependence on supplemental oxygen: Secondary | ICD-10-CM

## 2023-05-06 DIAGNOSIS — G9341 Metabolic encephalopathy: Secondary | ICD-10-CM | POA: Diagnosis present

## 2023-05-06 DIAGNOSIS — Z681 Body mass index (BMI) 19 or less, adult: Secondary | ICD-10-CM

## 2023-05-06 DIAGNOSIS — R0602 Shortness of breath: Secondary | ICD-10-CM | POA: Diagnosis not present

## 2023-05-06 DIAGNOSIS — R636 Underweight: Secondary | ICD-10-CM | POA: Diagnosis present

## 2023-05-06 DIAGNOSIS — J449 Chronic obstructive pulmonary disease, unspecified: Secondary | ICD-10-CM | POA: Diagnosis present

## 2023-05-06 DIAGNOSIS — Z7951 Long term (current) use of inhaled steroids: Secondary | ICD-10-CM

## 2023-05-06 DIAGNOSIS — Z7982 Long term (current) use of aspirin: Secondary | ICD-10-CM

## 2023-05-06 DIAGNOSIS — F0393 Unspecified dementia, unspecified severity, with mood disturbance: Secondary | ICD-10-CM | POA: Diagnosis present

## 2023-05-06 DIAGNOSIS — Z602 Problems related to living alone: Secondary | ICD-10-CM | POA: Diagnosis present

## 2023-05-06 DIAGNOSIS — I739 Peripheral vascular disease, unspecified: Secondary | ICD-10-CM | POA: Diagnosis present

## 2023-05-06 DIAGNOSIS — Z6372 Alcoholism and drug addiction in family: Secondary | ICD-10-CM

## 2023-05-06 LAB — CBC WITH DIFFERENTIAL/PLATELET
Abs Immature Granulocytes: 0.04 10*3/uL (ref 0.00–0.07)
Basophils Absolute: 0.1 10*3/uL (ref 0.0–0.1)
Basophils Relative: 1 %
Eosinophils Absolute: 0.9 10*3/uL — ABNORMAL HIGH (ref 0.0–0.5)
Eosinophils Relative: 10 %
HCT: 40.5 % (ref 36.0–46.0)
Hemoglobin: 12.3 g/dL (ref 12.0–15.0)
Immature Granulocytes: 0 %
Lymphocytes Relative: 19 %
Lymphs Abs: 1.8 10*3/uL (ref 0.7–4.0)
MCH: 28.9 pg (ref 26.0–34.0)
MCHC: 30.4 g/dL (ref 30.0–36.0)
MCV: 95.1 fL (ref 80.0–100.0)
Monocytes Absolute: 0.6 10*3/uL (ref 0.1–1.0)
Monocytes Relative: 6 %
Neutro Abs: 6.2 10*3/uL (ref 1.7–7.7)
Neutrophils Relative %: 64 %
Platelets: 378 10*3/uL (ref 150–400)
RBC: 4.26 MIL/uL (ref 3.87–5.11)
RDW: 14.5 % (ref 11.5–15.5)
WBC: 9.7 10*3/uL (ref 4.0–10.5)
nRBC: 0 % (ref 0.0–0.2)

## 2023-05-06 LAB — COMPREHENSIVE METABOLIC PANEL
ALT: 11 U/L (ref 0–44)
AST: 15 U/L (ref 15–41)
Albumin: 3.5 g/dL (ref 3.5–5.0)
Alkaline Phosphatase: 75 U/L (ref 38–126)
Anion gap: 10 (ref 5–15)
BUN: 8 mg/dL (ref 8–23)
CO2: 26 mmol/L (ref 22–32)
Calcium: 8.8 mg/dL — ABNORMAL LOW (ref 8.9–10.3)
Chloride: 107 mmol/L (ref 98–111)
Creatinine, Ser: 0.53 mg/dL (ref 0.44–1.00)
GFR, Estimated: >60 mL/min (ref >60)
Glucose, Bld: 119 mg/dL — ABNORMAL HIGH (ref 70–99)
Potassium: 4 mmol/L (ref 3.5–5.1)
Sodium: 143 mmol/L (ref 135–145)
Total Bilirubin: 0.6 mg/dL (ref 0.3–1.2)
Total Protein: 6.8 g/dL (ref 6.5–8.1)

## 2023-05-06 LAB — BLOOD GAS, VENOUS

## 2023-05-06 LAB — PROCALCITONIN: Procalcitonin: <0.1 ng/mL

## 2023-05-06 LAB — TROPONIN I (HIGH SENSITIVITY)
Troponin I (High Sensitivity): 10 ng/L (ref <18)
Troponin I (High Sensitivity): 13 ng/L (ref <18)

## 2023-05-06 MED ORDER — SODIUM CHLORIDE 0.9 % IV SOLN
1.0000 g | INTRAVENOUS | Status: AC
  Administered 2023-05-06: 1 g via INTRAVENOUS
  Filled 2023-05-06: qty 10

## 2023-05-06 MED ORDER — ONDANSETRON HCL 4 MG PO TABS: 4.0000 mg | ORAL_TABLET | Freq: Four times a day (QID) | ORAL | Status: AC | PRN

## 2023-05-06 MED ORDER — IPRATROPIUM-ALBUTEROL 0.5-2.5 (3) MG/3ML IN SOLN
3.0000 mL | Freq: Once | RESPIRATORY_TRACT | Status: AC
  Administered 2023-05-06: 3 mL via RESPIRATORY_TRACT
  Filled 2023-05-06: qty 3

## 2023-05-06 MED ORDER — PANTOPRAZOLE SODIUM 40 MG PO TBEC
80.0000 mg | DELAYED_RELEASE_TABLET | Freq: Every day | ORAL | Status: DC
  Administered 2023-05-06 – 2023-05-21 (×16): 80 mg via ORAL
  Filled 2023-05-06 (×16): qty 2

## 2023-05-06 MED ORDER — BENZONATATE 100 MG PO CAPS: 100.0000 mg | ORAL_CAPSULE | Freq: Three times a day (TID) | ORAL | Status: DC | PRN

## 2023-05-06 MED ORDER — METOPROLOL TARTRATE 5 MG/5ML IV SOLN: 5.0000 mg | INTRAVENOUS | Status: DC | PRN

## 2023-05-06 MED ORDER — POLYSACCHARIDE IRON COMPLEX 150 MG PO CAPS
150.0000 mg | ORAL_CAPSULE | Freq: Every day | ORAL | Status: DC
  Administered 2023-05-07 – 2023-05-21 (×15): 150 mg via ORAL
  Filled 2023-05-06 (×15): qty 1

## 2023-05-06 MED ORDER — LEVOTHYROXINE SODIUM 50 MCG PO TABS
50.0000 ug | ORAL_TABLET | Freq: Every morning | ORAL | Status: DC
  Administered 2023-05-07 – 2023-05-21 (×14): 50 ug via ORAL
  Filled 2023-05-06 (×15): qty 1

## 2023-05-06 MED ORDER — ADULT MULTIVITAMIN W/MINERALS CH
1.0000 | ORAL_TABLET | Freq: Every day | ORAL | Status: DC
  Administered 2023-05-07 – 2023-05-21 (×15): 1 via ORAL
  Filled 2023-05-06 (×14): qty 1

## 2023-05-06 MED ORDER — THIAMINE MONONITRATE 100 MG PO TABS
100.0000 mg | ORAL_TABLET | Freq: Every day | ORAL | Status: DC
  Administered 2023-05-07 – 2023-05-21 (×15): 100 mg via ORAL
  Filled 2023-05-06 (×15): qty 1

## 2023-05-06 MED ORDER — VITAMIN B-12 1000 MCG PO TABS
1000.0000 ug | ORAL_TABLET | Freq: Every day | ORAL | Status: DC
  Administered 2023-05-07 – 2023-05-21 (×15): 1000 ug via ORAL
  Filled 2023-05-06 (×15): qty 1

## 2023-05-06 MED ORDER — MONTELUKAST SODIUM 10 MG PO TABS
10.0000 mg | ORAL_TABLET | Freq: Every day | ORAL | Status: DC
  Administered 2023-05-06 – 2023-05-20 (×15): 10 mg via ORAL
  Filled 2023-05-06 (×15): qty 1

## 2023-05-06 MED ORDER — METHYLPREDNISOLONE SODIUM SUCC 40 MG IJ SOLR
40.0000 mg | Freq: Every day | INTRAMUSCULAR | Status: AC
  Administered 2023-05-07: 40 mg via INTRAVENOUS
  Filled 2023-05-06: qty 1

## 2023-05-06 MED ORDER — TRAZODONE HCL 100 MG PO TABS
300.0000 mg | ORAL_TABLET | Freq: Every day | ORAL | Status: DC
  Administered 2023-05-06 – 2023-05-20 (×15): 300 mg via ORAL
  Filled 2023-05-06 (×15): qty 3

## 2023-05-06 MED ORDER — HYDRALAZINE HCL 20 MG/ML IJ SOLN
5.0000 mg | Freq: Four times a day (QID) | INTRAMUSCULAR | Status: DC | PRN
  Administered 2023-05-06: 5 mg via INTRAVENOUS
  Filled 2023-05-06: qty 1

## 2023-05-06 MED ORDER — LORAZEPAM 0.5 MG PO TABS
0.5000 mg | ORAL_TABLET | Freq: Four times a day (QID) | ORAL | Status: AC | PRN
  Administered 2023-05-06 – 2023-05-07 (×2): 0.5 mg via ORAL
  Filled 2023-05-06 (×2): qty 1

## 2023-05-06 MED ORDER — HYDROXYZINE HCL 25 MG PO TABS
25.0000 mg | ORAL_TABLET | Freq: Three times a day (TID) | ORAL | Status: DC | PRN
  Administered 2023-05-06 – 2023-05-14 (×13): 25 mg via ORAL
  Filled 2023-05-06 (×14): qty 1

## 2023-05-06 MED ORDER — ENSURE ENLIVE PO LIQD
237.0000 mL | Freq: Three times a day (TID) | ORAL | Status: DC
  Administered 2023-05-07: 237 mL via ORAL

## 2023-05-06 MED ORDER — ENOXAPARIN SODIUM 40 MG/0.4ML IJ SOSY
40.0000 mg | PREFILLED_SYRINGE | INTRAMUSCULAR | Status: DC
  Administered 2023-05-06 – 2023-05-09 (×4): 40 mg via SUBCUTANEOUS
  Filled 2023-05-06 (×4): qty 0.4

## 2023-05-06 MED ORDER — ONDANSETRON HCL 4 MG/2ML IJ SOLN: 4.0000 mg | Freq: Four times a day (QID) | INTRAMUSCULAR | Status: AC | PRN

## 2023-05-06 MED ORDER — MELATONIN 5 MG PO TABS
5.0000 mg | ORAL_TABLET | Freq: Every day | ORAL | Status: DC
  Administered 2023-05-06 – 2023-05-20 (×15): 5 mg via ORAL
  Filled 2023-05-06 (×15): qty 1

## 2023-05-06 MED ORDER — ALPRAZOLAM 0.5 MG PO TABS
1.0000 mg | ORAL_TABLET | Freq: Once | ORAL | Status: AC
  Administered 2023-05-06: 1 mg via ORAL
  Filled 2023-05-06: qty 2

## 2023-05-06 MED ORDER — SODIUM CHLORIDE 0.9 % IV BOLUS
1000.0000 mL | Freq: Once | INTRAVENOUS | Status: AC
  Administered 2023-05-06: 1000 mL via INTRAVENOUS

## 2023-05-06 MED ORDER — METOPROLOL TARTRATE 25 MG PO TABS
25.0000 mg | ORAL_TABLET | Freq: Two times a day (BID) | ORAL | Status: DC
  Administered 2023-05-06 – 2023-05-21 (×28): 25 mg via ORAL
  Filled 2023-05-06 (×30): qty 1

## 2023-05-06 MED ORDER — VITAMIN C 500 MG PO TABS
500.0000 mg | ORAL_TABLET | Freq: Every day | ORAL | Status: DC
  Administered 2023-05-07 – 2023-05-21 (×15): 500 mg via ORAL
  Filled 2023-05-06 (×15): qty 1

## 2023-05-06 MED ORDER — METHYLPREDNISOLONE SODIUM SUCC 40 MG IJ SOLR
40.0000 mg | Freq: Once | INTRAMUSCULAR | Status: AC
  Administered 2023-05-06: 40 mg via INTRAVENOUS
  Filled 2023-05-06: qty 1

## 2023-05-06 MED ORDER — ATORVASTATIN CALCIUM 10 MG PO TABS
10.0000 mg | ORAL_TABLET | Freq: Every day | ORAL | Status: DC
  Administered 2023-05-06 – 2023-05-20 (×15): 10 mg via ORAL
  Filled 2023-05-06 (×17): qty 1

## 2023-05-06 MED ORDER — MOMETASONE FURO-FORMOTEROL FUM 200-5 MCG/ACT IN AERO
2.0000 | INHALATION_SPRAY | Freq: Two times a day (BID) | RESPIRATORY_TRACT | Status: DC
  Administered 2023-05-07 – 2023-05-21 (×29): 2 via RESPIRATORY_TRACT
  Filled 2023-05-06 (×3): qty 8.8

## 2023-05-06 MED ORDER — SODIUM CHLORIDE 0.9 % IV SOLN
500.0000 mg | INTRAVENOUS | Status: AC
  Administered 2023-05-06: 500 mg via INTRAVENOUS
  Filled 2023-05-06: qty 5

## 2023-05-06 MED ORDER — ACETAMINOPHEN 325 MG PO TABS
650.0000 mg | ORAL_TABLET | Freq: Four times a day (QID) | ORAL | Status: AC | PRN
  Administered 2023-05-07 – 2023-05-08 (×2): 650 mg via ORAL
  Filled 2023-05-06 (×2): qty 2

## 2023-05-06 MED ORDER — METOPROLOL TARTRATE 25 MG PO TABS
25.0000 mg | ORAL_TABLET | Freq: Once | ORAL | Status: AC
  Administered 2023-05-06: 25 mg via ORAL
  Filled 2023-05-06: qty 1

## 2023-05-06 MED ORDER — IPRATROPIUM-ALBUTEROL 0.5-2.5 (3) MG/3ML IN SOLN
3.0000 mL | Freq: Three times a day (TID) | RESPIRATORY_TRACT | Status: AC
  Administered 2023-05-06 – 2023-05-07 (×2): 3 mL via RESPIRATORY_TRACT
  Filled 2023-05-06 (×2): qty 3

## 2023-05-06 MED ORDER — RISPERIDONE 0.25 MG PO TABS
0.2500 mg | ORAL_TABLET | Freq: Every day | ORAL | Status: DC
  Administered 2023-05-06 – 2023-05-20 (×15): 0.25 mg via ORAL
  Filled 2023-05-06 (×16): qty 1

## 2023-05-06 MED ORDER — ACETAMINOPHEN 650 MG RE SUPP: 650.0000 mg | Freq: Four times a day (QID) | RECTAL | Status: AC | PRN

## 2023-05-06 MED ORDER — AMLODIPINE BESYLATE 5 MG PO TABS
5.0000 mg | ORAL_TABLET | Freq: Every day | ORAL | Status: DC
  Administered 2023-05-06 – 2023-05-20 (×15): 5 mg via ORAL
  Filled 2023-05-06 (×16): qty 1

## 2023-05-06 NOTE — Hospital Course (Addendum)
[name] is a 77 year old female with history of anxiety, hypothyroid, advanced COPD, hyperlipidemia, hypertension, who presents to the emergency department for chief concerns of shortness of breath.  Vitals in the ED showed temperature 97.9, respiration rate of 19, heart rate 141, blood pressure 163/99, SpO2 96% on 2 L nasal cannula.  Serum sodium is 143, potassium 4.0, chloride 107, bicarb 26, BUN of 8, serum creatinine of 0.53, eGFR greater than 60, nonfasting blood glucose 119, WBC 9.7, hemoglobin 12.3, platelets of 378.  High sensitive troponin was 10.  Chest x-ray: Read as emphysematous changes without acute cardiopulmonary process.  ED treatment: DuoNebs one-time treatment, Solu-Medrol 40 mg IV one-time dose, metoprolol 25 mg p.o. one-time dose, sodium chloride 1 L bolus.

## 2023-05-06 NOTE — Assessment & Plan Note (Addendum)
-   Atorvastatin 10 mg nightly resumed °

## 2023-05-06 NOTE — ED Notes (Signed)
Called respiratory about VBG

## 2023-05-06 NOTE — Assessment & Plan Note (Addendum)
Pneumonia cannot be excluded at this time as patient has had increased cough with productive green sputum Check procalcitonin on admission, added to prior collection, discussed with lab DuoNebs 3 times daily, 2 doses ordered Solu-Medrol 40 mg IV one-time dose starting on 05/07/2023 Maintain SpO2 greater than 92% One-time dose of azithromycin 500 mg IV and ceftriaxone 1 g IV on admission, a.m. team to continue antibiotic if patient has elevated procalcitonin Admit to telemetry medical, labs patient

## 2023-05-06 NOTE — Progress Notes (Signed)
Procalcitonin - in process at the time of this note: 20:02.

## 2023-05-06 NOTE — Assessment & Plan Note (Signed)
-   Atorvastatin 10 mg nightly resumed °

## 2023-05-06 NOTE — H&P (Addendum)
History and Physical   [name] DOB: [birth year] DOA: 05/06/2023  PCP: [name], MD  Outpatient Specialists: Dr. Karna Christmas, pulmonology Patient coming from: Home via EMS  I have personally briefly reviewed patient's old medical records in [facility] EMR.  Chief Concern: Shortness of breath  HPI: [name] is a 77 year old female with history of anxiety, hypothyroid, advanced COPD, hyperlipidemia, hypertension, who presents to the emergency department for chief concerns of shortness of breath.  Vitals in the ED showed temperature 97.9, respiration rate of 19, heart rate 141, blood pressure 163/99, SpO2 96% on 2 L nasal cannula.  Serum sodium is 143, potassium 4.0, chloride 107, bicarb 26, BUN of 8, serum creatinine of 0.53, eGFR greater than 60, nonfasting blood glucose 119, WBC 9.7, hemoglobin 12.3, platelets of 378.  High sensitive troponin was 10.  Chest x-ray: Read as emphysematous changes without acute cardiopulmonary process.  ED treatment: DuoNebs one-time treatment, Solu-Medrol 40 mg IV one-time dose, metoprolol 25 mg p.o. one-time dose, sodium chloride 1 L bolus. ------------------------------------- At bedside, patient is able to tell me her name, age, current location, current calendar year.   She reports that the shortness of breath started today. She denies known sick contacts and trauma to her person. She endorses that yesterday and 2 days ago, she has had productive green sputum. She denies fever, chills, chest pain, abdominal pain, dysuria, hematuria, nausea, vomiting.  Social history: Patient states she lives at home by herself.  She is a former tobacco user.  At her peak she smoked 2 packs/day.  She denies EtOH and recreational drug use.  ROS: Constitutional: no weight change, no fever ENT/Mouth: no sore throat, no rhinorrhea Eyes: no eye pain, no vision changes Cardiovascular: no chest pain, + dyspnea,  no edema, no  palpitations Respiratory: + cough, + sputum, no wheezing Gastrointestinal: no nausea, no vomiting, no diarrhea, no constipation Genitourinary: no urinary incontinence, no dysuria, no hematuria Musculoskeletal: no arthralgias, no myalgias Skin: no skin lesions, no pruritus, Neuro: + weakness, no loss of consciousness, no syncope Psych: no anxiety, no depression, no decrease appetite Heme/Lymph: no bruising, no bleeding  ED Course: Discussed with emergency medicine provider, patient requiring hospitalization for chief concerns of COPD exacerbation.  Assessment/Plan  Principal Problem:   COPD exacerbation (HCC) Active Problems:   Chronic pain syndrome   CAD (coronary artery disease)   Essential hypertension   Underweight   HLD (hyperlipidemia)   Hypothyroidism   Anxiety and depression   Protein-calorie malnutrition, severe (HCC)   PAD (peripheral artery disease) (HCC)   Acute on chronic respiratory failure with hypoxemia (HCC)   Assessment and Plan:  * COPD exacerbation (HCC) Pneumonia cannot be excluded at this time as patient has had increased cough with productive green sputum Check procalcitonin on admission, added to prior collection, discussed with lab DuoNebs 3 times daily, 2 doses ordered Solu-Medrol 40 mg IV one-time dose starting on 05/07/2023 Maintain SpO2 greater than 92% One-time dose of azithromycin 500 mg IV and ceftriaxone 1 g IV on admission, a.m. team to continue antibiotic if patient has elevated procalcitonin Admit to telemetry medical, labs patient  CAD (coronary artery disease) Atorvastatin 10 mg nightly resumed  Essential hypertension Initially not controlled, patient missed her a.m. blood pressure medication dose Resumed home amlodipine 5 mg daily, metoprolol tartrate 25 mg p.o. twice daily Hydralazine 5 mg IV every 6 hours as needed for SBP greater 175, 4 days ordered  HLD (hyperlipidemia) Atorvastatin 10 mg nightly  resumed  Hypothyroidism Levothyroxine 50 mcg every morning resumed  Anxiety and depression Home trazodone 300 mg nightly resumed Home hydroxyzine 25 mg 3 times daily as needed for anxiety; lorazepam 0.5 mg p.o. every 6 hours.  For anxiety, 1 day ordered  Protein-calorie malnutrition, severe (HCC) Home Ensure between meals resumed  Chart reviewed.   Hospitalization from 02/24/2023 to 03/02/2023: Patient was admitted for COPD exacerbation, patient was given IV Solu-Medrol, Z-Pak, with Advair inhaler and, Combivent Respimat.  Patient also had acute metabolic encephalopathy with acute delirium as patient had hallucination at the time.  Psychiatry saw patient and patient was advised to follow-up with PCP and psychiatry treat outpatient.  Complete echo on 02/27/2023: Was read as estimated ejection fraction of 55 to 60%  DVT prophylaxis: Enoxaparin Code Status: Full code Diet: Heart healthy Family Communication: No, patient declines Disposition Plan: Pending clinical course Consults called: None at this time Admission status: Telemetry medical, observation  Past Medical History:  Diagnosis Date   Anxiety    CAD (coronary artery disease)    Carpal tunnel syndrome    Cataract    Complication of anesthesia    has woken up at times   COPD (chronic obstructive pulmonary disease) (HCC)    CRP elevated 09/14/2015   Depression    Difficult intubation    Dyslipidemia    Dyspnea    DOE   Dysrhythmia    hx palpatations   Elevated sedimentation rate 09/14/2015   Esophageal spasm    Gastrointestinal parasites    GERD (gastroesophageal reflux disease)    HCAP (healthcare-associated pneumonia) 10/22/2018   Hiatal hernia    History of peptic ulcer disease    Hypertension    Hyperthyroidism    Hypothyroidism    Low magnesium levels 09/14/2015   Pelvic fracture (HCC) 2008   fall from riding a horse   Peripheral vascular disease (HCC)    Reflux    Rotator cuff injury    Sepsis (HCC)  10/22/2018   Stenosis, spinal, lumbar    Past Surgical History:  Procedure Laterality Date   AMPUTATION TOE Right 12/26/2020   Procedure: AMPUTATION TOE-3rd Toes;  Surgeon: Gwyneth Revels, DPM;  Location: [facility];  Service: Podiatry;  Laterality: Right;   AMPUTATION TOE Right 04/22/2021   Procedure: AMPUTATION TOE- RIGHT 2ND;  Surgeon: Gwyneth Revels, DPM;  Location: [facility];  Service: Podiatry;  Laterality: Right;   AMPUTATION TOE Right 10/21/2021   Procedure: AMPUTATION TOE;  Surgeon: Rosetta Posner, DPM;  Location: [facility];  Service: Podiatry;  Laterality: Right;   AMPUTATION TOE Right 11/23/2021   Procedure: AMPUTATION TOE;  Surgeon: Linus Galas, DPM;  Location: [facility];  Service: Podiatry;  Laterality: Right;   AMPUTATION TOE Right 07/05/2022   Procedure: AMPUTATION RIGHT 5TH TOE;  Surgeon: Edwin Cap, DPM;  Location: [facility];  Service: Podiatry;  Laterality: Right;   APPENDECTOMY     BACK SURGERY     CERVICAL FUSION   CARPAL TUNNEL RELEASE     CATARACT EXTRACTION W/PHACO Right 08/07/2017   Procedure: CATARACT EXTRACTION PHACO AND INTRAOCULAR LENS PLACEMENT (IOC);  Surgeon: Galen Manila, MD;  Location: [facility];  Service: Ophthalmology;  Laterality: Right;  [place] 00:52.0 AP% 16.8 CDE 8.74 Fluid Pack Lot # [id number]   CATARACT EXTRACTION W/PHACO Left 09/04/2017   Procedure: CATARACT EXTRACTION PHACO AND INTRAOCULAR LENS PLACEMENT (IOC);  Surgeon: Galen Manila, MD;  Location: [facility];  Service: Ophthalmology;  Laterality: Left;  [place] 00:34 AP% 17.0 CDE 5.80 Fluid pack  lot # [id number]   CESAREAN SECTION     CHOLECYSTECTOMY     FRACTURE SURGERY     HIP SURGERY     INTRAMEDULLARY (IM) NAIL INTERTROCHANTERIC Right 10/01/2016   Procedure: INTRAMEDULLARY (IM) NAIL INTERTROCHANTRIC;  Surgeon: Christena Flake, MD;  Location: [facility];  Service: Orthopedics;  Laterality: Right;   INTRAMEDULLARY (IM) NAIL INTERTROCHANTERIC Left 01/14/2021   Procedure: INTRAMEDULLARY (IM) NAIL  INTERTROCHANTRIC;  Surgeon: Lyndle Herrlich, MD;  Location: [facility];  Service: Orthopedics;  Laterality: Left;   KYPHOPLASTY N/A 08/20/2018   Procedure: [id number];  Surgeon: Kennedy Bucker, MD;  Location: [facility];  Service: Orthopedics;  Laterality: N/A;   LOWER EXTREMITY ANGIOGRAPHY Right 12/18/2019   Procedure: LOWER EXTREMITY ANGIOGRAPHY;  Surgeon: Annice Needy, MD;  Location: [facility];  Service: Cardiovascular;  Laterality: Right;   LOWER EXTREMITY ANGIOGRAPHY Right 12/02/2020   Procedure: LOWER EXTREMITY ANGIOGRAPHY;  Surgeon: Annice Needy, MD;  Location: [facility];  Service: Cardiovascular;  Laterality: Right;   LOWER EXTREMITY ANGIOGRAPHY Right 12/23/2020   Procedure: Lower Extremity Angiography;  Surgeon: Annice Needy, MD;  Location: [facility];  Service: Cardiovascular;  Laterality: Right;   LOWER EXTREMITY ANGIOGRAPHY Right 12/24/2020   Procedure: Lower Extremity Angiography;  Surgeon: Annice Needy, MD;  Location: [facility];  Service: Cardiovascular;  Laterality: Right;   LOWER EXTREMITY ANGIOGRAPHY Right 10/20/2021   Procedure: Lower Extremity Angiography;  Surgeon: Annice Needy, MD;  Location: [facility];  Service: Cardiovascular;  Laterality: Right;   LOWER EXTREMITY ANGIOGRAPHY Right 07/03/2022   Procedure: Lower Extremity Angiography;  Surgeon: Annice Needy, MD;  Location: [facility];  Service: Cardiovascular;  Laterality: Right;   NECK SURGERY     NOSE SURGERY     ROTATOR CUFF REPAIR     x2   SHOULDER ARTHROSCOPY  12/07/2011   Procedure: ARTHROSCOPY SHOULDER;  Surgeon: Loreta Ave, MD;  Location: [facility];  Service: Orthopedics;  Laterality: Right;  Debridement Partial Cuff Tear, Release Coracoacromial Ligament   SHOULDER SURGERY  12/07/2011   right   Social History:  reports that she has been smoking cigarettes. She has never used smokeless tobacco. She reports that she does not drink alcohol and  does not use drugs.  No Known Allergies Family History  Problem Relation Age of Onset   Alcoholism Brother    Aneurysm Other    Family history: Family history reviewed and not pertinent.  Prior to Admission medications   Medication Sig Start Date End Date Taking? Authorizing Provider  albuterol (VENTOLIN HFA) 108 (90 Base) MCG/ACT inhaler Inhale 1-2 puffs into the lungs every 4 (four) hours as needed for shortness of breath or wheezing. 06/28/22   [provider]  ALPRAZolam Prudy Feeler) 0.5 MG tablet Take 2 tablets (1 mg total) by mouth 3 (three) times daily as needed for anxiety. 03/02/23   Gillis Santa, MD  amLODipine (NORVASC) 5 MG tablet Take 1 tablet (5 mg total) by mouth daily. 11/10/22   Arnetha Courser, MD  ammonium lactate (LAC-HYDRIN) 12 % lotion APPLY TO AFFECTED AREA AS NEEDED FOR DRY SKIN 01/02/23   Candelaria Stagers, DPM  ascorbic acid (VITAMIN C) 500 MG tablet Take 1 tablet (500 mg total) by mouth daily. 03/03/23 06/01/23  Gillis Santa, MD  ASPIRIN LOW DOSE 81 MG tablet Take 81 mg by mouth daily. 12/29/22   [provider]  atorvastatin (LIPITOR) 10 MG tablet  Take 1 tablet (10 mg total) by mouth daily. 11/10/22 11/10/23  Arnetha Courser, MD  benzonatate (TESSALON) 100 MG capsule Take 1 capsule (100 mg total) by mouth 3 (three) times daily as needed for cough. 03/02/23   Gillis Santa, MD  COMBIVENT RESPIMAT 20-100 MCG/ACT AERS respimat Inhale 1-2 puffs into the lungs 4 (four) times daily. 11/25/21   Alford Highland, MD  cyanocobalamin 1000 MCG tablet Take 1 tablet (1,000 mcg total) by mouth daily. 03/06/23 06/04/23  Gillis Santa, MD  feeding supplement (ENSURE ENLIVE / ENSURE PLUS) LIQD Take 237 mLs by mouth 3 (three) times daily between meals. 02/03/23   Darlin Priestly, MD  fluticasone-salmeterol (ADVAIR) 250-50 MCG/ACT AEPB Inhale 1 puff into the lungs 2 (two) times daily as needed.    [provider]  hydrOXYzine (ATARAX) 25 MG tablet Take 1 tablet (25 mg total) by mouth 3  (three) times daily as needed for anxiety. 03/02/23   Gillis Santa, MD  iron polysaccharides (NIFEREX) 150 MG capsule Take 1 capsule (150 mg total) by mouth daily. 03/03/23 06/01/23  Gillis Santa, MD  levothyroxine (SYNTHROID) 50 MCG tablet Take 50 mcg by mouth every morning.    [provider]  melatonin 5 MG TABS Take 1 tablet (5 mg total) by mouth at bedtime. 03/02/23 03/01/24  Gillis Santa, MD  metoprolol tartrate (LOPRESSOR) 25 MG tablet Take 1 tablet (25 mg total) by mouth 2 (two) times daily. Skip the dose if systolic BP less than 130 mmHg and/or heart rate less than 65 beats per minute 03/02/23 08/29/23  Gillis Santa, MD  montelukast (SINGULAIR) 10 MG tablet Take 10 mg by mouth at bedtime.    [provider]  Multiple Vitamin (MULTIVITAMIN WITH MINERALS) TABS tablet Take 1 tablet by mouth daily. 11/10/22   Arnetha Courser, MD  omeprazole (PRILOSEC) 40 MG capsule Take 40 mg by mouth daily.    [provider]  risperiDONE (RISPERDAL) 0.25 MG tablet Take 1 tablet (0.25 mg total) by mouth at bedtime. 03/02/23 05/31/23  Gillis Santa, MD  thiamine (VITAMIN B-1) 100 MG tablet Take 1 tablet (100 mg total) by mouth daily. 11/10/22   Arnetha Courser, MD  traZODone (DESYREL) 150 MG tablet Take 300 mg by mouth at bedtime.    [provider]  Vitamin D, Ergocalciferol, (DRISDOL) 1.25 MG (50000 UNIT) CAPS capsule Take 1 capsule (50,000 Units total) by mouth every 7 (seven) days. 03/07/23 06/05/23  Gillis Santa, MD   Physical Exam: Vitals:   05/06/23 1615 05/06/23 1630 05/06/23 1645 05/06/23 1700  BP:  (!) 140/84  (!) 165/101  Pulse: 65 (!) 117 (!) 120 (!) 122  Resp: (!) 24 (!) 22 (!) 23 (!) 23  Temp:      TempSrc:      SpO2: 97% 98% 98% 100%   Constitutional: appears older than chronological age, cachectic, frail Eyes: PERRL, lids and conjunctivae normal ENMT: Mucous membranes are moist. Posterior pharynx clear of any exudate or lesions. Age-appropriate dentition.  Hearing appropriate Neck: normal, supple, no masses, no thyromegaly Respiratory: Generalized decreased lung sounds bilaterally, diffuse wheezing on auscultation, no crackles.  Mild increase respiratory effort.  Mild increase accessory muscle use.  Cardiovascular: Regular rate and rhythm, no murmurs / rubs / gallops. No extremity edema. 2+ pedal pulses. No carotid bruits.  Abdomen: no tenderness, no masses palpated, no hepatosplenomegaly. Bowel sounds positive.  Musculoskeletal: no clubbing / cyanosis. No joint deformity upper and lower extremities. Good ROM, no contractures, no atrophy. Normal muscle tone.  Frail Skin: no rashes, lesions, ulcers. No induration, dry skin Neurologic: Sensation intact. Strength 5/5 in all 4.  Psychiatric: Normal judgment and insight. Alert and oriented x 3.  Normal mood.   EKG: independently reviewed, showing sinus tachycardia with rate of 142, QTc 534  Chest x-ray on Admission: I personally reviewed and I agree with radiologist reading as below.  DG Chest Portable 1 View  Result Date: 05/06/2023 CLINICAL DATA:  Shortness of breath EXAM: PORTABLE CHEST 1 VIEW COMPARISON:  Chest radiograph dated 03/13/2023. FINDINGS: The heart size and mediastinal contours are within normal limits. Vascular calcifications are seen in the aortic arch. Emphysematous changes are noted. No focal consolidation, pleural effusion, or pneumothorax. Fixation hardware overlies the cervical spine. There is osseous demineralization. Degenerative changes are seen in the spine. IMPRESSION: Emphysematous changes without acute cardiopulmonary process. Electronically Signed   By: Romona Curls M.D.   On: 05/06/2023 16:10    Labs on Admission: I have personally reviewed following labs  CBC: Recent Labs  Lab 05/06/23 1605  WBC 9.7  NEUTROABS 6.2  HGB 12.3  HCT 40.5  MCV 95.1  PLT 378   Basic Metabolic Panel: Recent Labs  Lab 05/06/23 1605  NA 143  K 4.0  CL 107  CO2 26  GLUCOSE 119*   BUN 8  CREATININE 0.53  CALCIUM 8.8*   GFR: CrCl cannot be calculated (Unknown ideal weight.).  Liver Function Tests: Recent Labs  Lab 05/06/23 1605  AST 15  ALT 11  ALKPHOS 75  BILITOT 0.6  PROT 6.8  ALBUMIN 3.5   Urine analysis:    Component Value Date/Time   COLORURINE YELLOW (A) 02/24/2023 1251   APPEARANCEUR HAZY (A) 02/24/2023 1251   LABSPEC 1.020 02/24/2023 1251   PHURINE 5.0 02/24/2023 1251   GLUCOSEU NEGATIVE 02/24/2023 1251   HGBUR NEGATIVE 02/24/2023 1251   BILIRUBINUR NEGATIVE 02/24/2023 1251   KETONESUR 80 (A) 02/24/2023 1251   PROTEINUR 30 (A) 02/24/2023 1251   NITRITE NEGATIVE 02/24/2023 1251   LEUKOCYTESUR NEGATIVE 02/24/2023 1251   This document was prepared using Dragon Voice Recognition software and may include unintentional dictation errors.  Dr. Sedalia Muta [facility]  If 7PM-7AM, please contact overnight-coverage provider If 7AM-7PM, please contact day attending provider [email or web address]  05/06/2023, 7:00 PM

## 2023-05-06 NOTE — Assessment & Plan Note (Addendum)
Home trazodone 300 mg nightly resumed Home hydroxyzine 25 mg 3 times daily as needed for anxiety; lorazepam 0.5 mg p.o. every 6 hours.  For anxiety, 1 day ordered

## 2023-05-06 NOTE — ED Provider Notes (Signed)
[facility] Provider Note    Event Date/Time   First MD Initiated Contact with Patient 05/06/23 1532     (approximate)   History   Shortness of Breath   HPI  [name] is a 77 y.o. female extensive history of advanced COPD with chronic O2 requirement at home typically wears 2 L nasal cannula presents to the ER for evaluation of chest pain and pressure between her shoulder blades some shortness of breath.  EMS was called out and found her to be 60% on pulse oximeter.  But does not appear that she was connected to her home oxygen at the time.  She was given nebulizer treatment and placed back on her home oxygen with improvement in her symptoms but she still having some chest discomfort.  Unclear as to how long she has been off of her oxygen.  No fevers or chills.     Physical Exam   Triage Vital Signs: ED Triage Vitals  Enc Vitals Group     BP      Pulse      Resp      Temp      Temp src      SpO2      Weight      Height      Head Circumference      Peak Flow      Pain Score      Pain Loc      Pain Edu?      Excl. in GC?     Most recent vital signs: Vitals:   05/06/23 1645 05/06/23 1700  BP:  (!) 165/101  Pulse: (!) 120 (!) 122  Resp: (!) 23 (!) 23  Temp:    SpO2: 98% 100%     Constitutional: Alert  Eyes: Conjunctivae are normal.  Head: Atraumatic. Nose: No congestion/rhinnorhea. Mouth/Throat: Mucous membranes are moist.   Neck: Painless ROM.  Cardiovascular:   Good peripheral circulation. Respiratory: Prolonged expiratory phase with diffuse expiratory wheeze. Gastrointestinal: Soft and nontender.  Musculoskeletal:  no deformity Neurologic:  MAE spontaneously. No gross focal neurologic deficits are appreciated.  Skin:  Skin is warm, dry and intact. No rash noted. Psychiatric: Mood and affect are normal. Speech and behavior are normal.    ED Results / Procedures / Treatments   Labs (all labs ordered are listed, but only  abnormal results are displayed) Labs Reviewed  CBC WITH DIFFERENTIAL/PLATELET - Abnormal; Notable for the following components:      Result Value   Eosinophils Absolute 0.9 (*)    All other components within normal limits  COMPREHENSIVE METABOLIC PANEL - Abnormal; Notable for the following components:   Glucose, Bld 119 (*)    Calcium 8.8 (*)    All other components within normal limits  BLOOD GAS, VENOUS - Abnormal; Notable for the following components:   pCO2, Ven 77 (*)    Bicarbonate 37.0 (*)    Acid-Base Excess 7.5 (*)    All other components within normal limits  TROPONIN I (HIGH SENSITIVITY)  TROPONIN I (HIGH SENSITIVITY)     EKG  ED ECG REPORT I, Willy Eddy, the attending physician, personally viewed and interpreted this ECG.   Date: 05/06/2023  EKG Time: 15:39  Rate: 140  Rhythm: sinus  Axis: normal  Intervals: prolonged qt  ST&T Change: no stemi, motion artifact    RADIOLOGY Please see ED Course for my review and interpretation.  I personally reviewed all radiographic images ordered  to evaluate for the above acute complaints and reviewed radiology reports and findings.  These findings were personally discussed with the patient.  Please see medical record for radiology report.    PROCEDURES:  Critical Care performed: No  Procedures   MEDICATIONS ORDERED IN ED: Medications  ipratropium-albuterol (DUONEB) 0.5-2.5 (3) MG/3ML nebulizer solution 3 mL (has no administration in time range)  methylPREDNISolone sodium succinate (SOLU-MEDROL) 40 mg/mL injection 40 mg (40 mg Intravenous Given 05/06/23 1638)  ipratropium-albuterol (DUONEB) 0.5-2.5 (3) MG/3ML nebulizer solution 3 mL (3 mLs Nebulization Given 05/06/23 1638)  sodium chloride 0.9 % bolus 1,000 mL (1,000 mLs Intravenous New Bag/Given 05/06/23 1640)  metoprolol tartrate (LOPRESSOR) tablet 25 mg (25 mg Oral Given 05/06/23 1725)     IMPRESSION / MDM / ASSESSMENT AND PLAN / ED COURSE  I reviewed the  triage vital signs and the nursing notes.                              Differential diagnosis includes, but is not limited to, Asthma, copd, CHF, pna, ptx, malignancy, Pe, anemia  Patient presenting to the ER for evaluation of symptoms as described above.  Based on symptoms, risk factors and considered above differential, this presenting complaint could reflect a potentially life-threatening illness therefore the patient will be placed on continuous pulse oximetry and telemetry for monitoring.  Laboratory evaluation will be sent to evaluate for the above complaints.      Clinical Course as of 05/06/23 1743  Sun May 06, 2023  1556 Chest x-ray my review and interpretation without evidence of pneumothorax. [PR]  1742 Patient does have evidence of acute hypercapnic respiratory failure in the setting of her chronic hypoxic respiratory failure.  Still quite dyspneic with audible wheezing.  Will give additional nebulizers.  Receiving IV fluids for tachycardia.  Lower suspicion for PE at this time.  Given her presentation will consult hospitalist for admission. [PR]    Clinical Course User Index [PR] Willy Eddy, MD     FINAL CLINICAL IMPRESSION(S) / ED DIAGNOSES   Final diagnoses:  COPD exacerbation (HCC)     Rx / DC Orders   ED Discharge Orders     None        Note:  This document was prepared using Dragon voice recognition software and may include unintentional dictation errors.    Willy Eddy, MD 05/06/23 1743

## 2023-05-06 NOTE — Assessment & Plan Note (Signed)
Levothyroxine 50 mcg every morning resumed
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
## 2023-05-06 NOTE — Assessment & Plan Note (Signed)
Home Ensure between meals resumed
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
## 2023-05-06 NOTE — Assessment & Plan Note (Addendum)
Initially not controlled, patient missed her a.m. blood pressure medication dose Resumed home amlodipine 5 mg daily, metoprolol tartrate 25 mg p.o. twice daily Hydralazine 5 mg IV every 6 hours as needed for SBP greater 175, 4 days ordered

## 2023-05-07 DIAGNOSIS — F32A Depression, unspecified: Secondary | ICD-10-CM

## 2023-05-07 DIAGNOSIS — F419 Anxiety disorder, unspecified: Secondary | ICD-10-CM

## 2023-05-07 DIAGNOSIS — J441 Chronic obstructive pulmonary disease with (acute) exacerbation: Secondary | ICD-10-CM | POA: Diagnosis not present

## 2023-05-07 DIAGNOSIS — J9621 Acute and chronic respiratory failure with hypoxia: Secondary | ICD-10-CM

## 2023-05-07 DIAGNOSIS — I10 Essential (primary) hypertension: Secondary | ICD-10-CM | POA: Diagnosis not present

## 2023-05-07 DIAGNOSIS — I739 Peripheral vascular disease, unspecified: Secondary | ICD-10-CM

## 2023-05-07 DIAGNOSIS — E43 Unspecified severe protein-calorie malnutrition: Secondary | ICD-10-CM

## 2023-05-07 DIAGNOSIS — G894 Chronic pain syndrome: Secondary | ICD-10-CM

## 2023-05-07 LAB — BASIC METABOLIC PANEL
Anion gap: 8 (ref 5–15)
BUN: 10 mg/dL (ref 8–23)
CO2: 25 mmol/L (ref 22–32)
Calcium: 8.3 mg/dL — ABNORMAL LOW (ref 8.9–10.3)
Chloride: 110 mmol/L (ref 98–111)
Creatinine, Ser: 0.49 mg/dL (ref 0.44–1.00)
GFR, Estimated: >60 mL/min (ref >60)
Glucose, Bld: 128 mg/dL — ABNORMAL HIGH (ref 70–99)
Potassium: 4 mmol/L (ref 3.5–5.1)
Sodium: 143 mmol/L (ref 135–145)

## 2023-05-07 LAB — BLOOD GAS, VENOUS
Acid-Base Excess: 7.5 mmol/L — ABNORMAL HIGH (ref 0.0–2.0)
Patient temperature: 37
pCO2, Ven: 77 mmHg (ref 44–60)
pH, Ven: 7.29 (ref 7.25–7.43)

## 2023-05-07 LAB — CBC
HCT: 33.7 % — ABNORMAL LOW (ref 36.0–46.0)
Hemoglobin: 10.5 g/dL — ABNORMAL LOW (ref 12.0–15.0)
MCH: 29.4 pg (ref 26.0–34.0)
MCHC: 31.2 g/dL (ref 30.0–36.0)
MCV: 94.4 fL (ref 80.0–100.0)
Platelets: 285 10*3/uL (ref 150–400)
RBC: 3.57 MIL/uL — ABNORMAL LOW (ref 3.87–5.11)
RDW: 14.4 % (ref 11.5–15.5)
WBC: 5.4 10*3/uL (ref 4.0–10.5)
nRBC: 0 % (ref 0.0–0.2)

## 2023-05-07 MED ORDER — OXYCODONE-ACETAMINOPHEN 5-325 MG PO TABS
1.0000 | ORAL_TABLET | Freq: Four times a day (QID) | ORAL | Status: DC | PRN
  Administered 2023-05-07 – 2023-05-09 (×8): 1 via ORAL
  Filled 2023-05-07 (×9): qty 1

## 2023-05-07 MED ORDER — AZITHROMYCIN 250 MG PO TABS
250.0000 mg | ORAL_TABLET | Freq: Every day | ORAL | Status: AC
  Administered 2023-05-07 – 2023-05-09 (×3): 250 mg via ORAL
  Filled 2023-05-07 (×3): qty 1

## 2023-05-07 MED ORDER — PREDNISONE 20 MG PO TABS
40.0000 mg | ORAL_TABLET | Freq: Every day | ORAL | Status: DC
  Administered 2023-05-08: 40 mg via ORAL
  Filled 2023-05-07: qty 2

## 2023-05-07 MED ORDER — BOOST / RESOURCE BREEZE PO LIQD CUSTOM
1.0000 | Freq: Three times a day (TID) | ORAL | Status: DC
  Administered 2023-05-07 – 2023-05-15 (×8): 1 via ORAL

## 2023-05-07 NOTE — Progress Notes (Signed)
  Progress Note   Patient: [name] UJW:[id number] DOB: [birth year] DOA: 05/06/2023     0 DOS: the patient was seen and examined on 05/07/2023   Brief hospital course: [name] is a 77 year old female with history of anxiety, hypothyroid, advanced COPD, hyperlipidemia, hypertension, who presents to the emergency department for chief concerns of shortness of breath.  Patient is admitted to hospitalist service for further management evaluation of COPD exacerbation.  Assessment and Plan: * COPD exacerbation (HCC) Solu-Medrol 40 mg IV changed to prednisone 40 with taper. Maintain SpO2 greater than 92%, she is on 2 L supplemental oxygen which is at home dose. Pro-Cal negative. Will give azithromycin 250 mg x 3 days. Continue antitussive medications.  CAD (coronary artery disease) Continue aspirin, statin therapy.  Essential hypertension Continue home amlodipine 5 mg daily, metoprolol tartrate 25 mg p.o. twice daily Hydralazine 5 mg IV every 6 hours as needed for SBP greater 175.  HLD (hyperlipidemia) Atorvastatin 10 mg nightly resumed  Hypothyroidism Continue Levothyroxine 50 mcg qam.  Anxiety and depression Continue home dose trazodone, hydroxyzine 25 mg 3 times daily as needed for anxiety; She has pain contract, will avoid benzos.  Protein-calorie malnutrition, severe [facility]) Dietitian consult. Continue dietary supplementation.  Fall, aspiration precautions. DVT prophylaxis with SCD. CODE STATUS full code.      Subjective: Patient is seen and examined today morning.  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  She has mild shortness of breath, did not get out of bed.  BP borderline low.  Feels weak.  Physical Exam: Vitals:   05/06/23 2230 05/06/23 2340 05/07/23 0015 05/07/23 0853  BP: 96/63 (!) 98/56 110/64 127/65  Pulse: 96 73 77 86  Resp: (!) 33 (!) 31 (!) 22 17  Temp:   (!) 97.5 F (36.4 C) 98  F (36.7 C)  TempSrc:   Oral   SpO2: 100% 99% 98% 100%  Weight:   40.4 kg    General - Elderly thin built Caucasian female, in distress-due to pain HEENT - PERRLA, EOMI, atraumatic head, non tender sinuses. Lung - Clear, rales, rhonchi, wheezes. Heart - S1, S2 heard, no murmurs, rubs, no pedal edema Neuro - Alert, awake and oriented x 3, non focal exam. Skin - Warm and dry. Data Reviewed:  CBC, BMP, blood sugar  Family Communication: Patient understands and agrees,  Disposition: Status is: Observation The patient remains OBS appropriate and will d/c before 2 midnights.  Planned Discharge Destination: Home with Home Health    Time spent: 44 minutes  Author: Marcelino Duster, MD 05/07/2023 4:55 PM  For on call review [email or web address].

## 2023-05-07 NOTE — Progress Notes (Signed)
Patient is alert and oriented  x 4,on 2 liters of oxygen nasal cannula,breathing treatments have been administered throughout the day.Patient gets very anxious at times and has to be reminded to breathe slowly, she is very worried about her dog . Medicated for pain, she can sit on the side of the bed , but refuses to move her right foot or move off the bed.Encourage to shift weight  off her hips to help reduce pressure form buttocks.Personal items and call bell in reach, will continue to monitor.

## 2023-05-07 NOTE — Progress Notes (Signed)
Initial Nutrition Assessment  DOCUMENTATION CODES:   Underweight, Severe malnutrition in context of chronic illness  INTERVENTION:   -D/c Ensure Enlive po BID, each supplement provides 350 kcal and 20 grams of protein -MVI with minerals daily -Boost Breeze po TID, each supplement provides 250 kcal and 9 grams of protein  -Liberalize diet to regular for widest variety of meal selections -Feeding assistance with meals  NUTRITION DIAGNOSIS:   Severe Malnutrition related to chronic illness (COPD) as evidenced by moderate fat depletion, severe fat depletion, moderate muscle depletion, severe muscle depletion, percent weight loss.  GOAL:   Patient will meet greater than or equal to 90% of their needs  MONITOR:   PO intake, Supplement acceptance  REASON FOR ASSESSMENT:   Malnutrition Screening Tool    ASSESSMENT:   Pt with history of anxiety, hypothyroid, advanced COPD, hyperlipidemia, hypertension, who presents for chief concerns of shortness of breath.  Pt admitted with COPD.   Reviewed I/O's: +480 ml x 24 hours  Spoke with pt at bedside, who was pleasant and in good spirits today. Pt reports feeling better today. She is very familiar to this RD due to multiple hospital admissions. Pt shares that she lives alone, but has caregivers who prepare meals for her. Pt usually eats 1-2 meals per day, however, pt shares that pt mostly eats canned soup, as that is what the caregivers prepare for her. Pt reports weakness in her hands does does best with foods that does not require a lot of cutting. She also has difficulty opening containers such as milk cartons.   Pt endorses wt loss, but unsure how much of UBW. Reviewed wt hx; pt has experienced a 24.5% wt loss over the past 3 months, which is significant for time frame.   Discussed importance of good meal and supplement intake to promote healing. Pt does not like milky supplements, but willing to try Boost Breeze.   Medications  reviewed and include vitamin C, vitamin B-12, melatonin, thiamine, and protonix.   Labs reviewed: CBGS: 107 (inpatient orders for glycemic control are none).    NUTRITION - FOCUSED PHYSICAL EXAM:  Flowsheet Row Most Recent Value  Orbital Region Moderate depletion  Upper Arm Region Severe depletion  Thoracic and Lumbar Region Moderate depletion  Buccal Region Severe depletion  Temple Region Severe depletion  Clavicle Bone Region Severe depletion  Clavicle and Acromion Bone Region Severe depletion  Scapular Bone Region Severe depletion  Dorsal Hand Severe depletion  Patellar Region Severe depletion  Anterior Thigh Region Severe depletion  Posterior Calf Region Severe depletion  Edema (RD Assessment) None  Hair Reviewed  Eyes Reviewed  Mouth Reviewed  Skin Reviewed  Nails Reviewed       Diet Order:   Diet Order             Diet regular Fluid consistency: Thin  Diet effective now                   EDUCATION NEEDS:   Education needs have been addressed  Skin:  Skin Assessment: Reviewed RN Assessment  Last BM:  Unknown  Height:   Ht Readings from Last 1 Encounters:  03/13/23 5\' 4"  (1.626 m)    Weight:   Wt Readings from Last 1 Encounters:  05/07/23 40.4 kg    Ideal Body Weight:  54.5 kg  BMI:  Body mass index is 15.29 kg/m.  Estimated Nutritional Needs:   Kcal:  1600-1800  Protein:  80-95 grams  Fluid:  >  1.6 L    Loistine Chance, RD, LDN, [place] Registered Dietitian II Certified Diabetes Care and Education Specialist Please refer to [facility] for RD and/or RD on-call/weekend/after hours pager

## 2023-05-07 NOTE — Progress Notes (Signed)
Called grandson [place] [phone]

## 2023-05-08 DIAGNOSIS — I10 Essential (primary) hypertension: Secondary | ICD-10-CM | POA: Diagnosis not present

## 2023-05-08 DIAGNOSIS — E43 Unspecified severe protein-calorie malnutrition: Secondary | ICD-10-CM | POA: Diagnosis not present

## 2023-05-08 DIAGNOSIS — J9621 Acute and chronic respiratory failure with hypoxia: Secondary | ICD-10-CM | POA: Diagnosis not present

## 2023-05-08 DIAGNOSIS — J441 Chronic obstructive pulmonary disease with (acute) exacerbation: Secondary | ICD-10-CM | POA: Diagnosis not present

## 2023-05-08 MED ORDER — PREDNISONE 20 MG PO TABS
20.0000 mg | ORAL_TABLET | Freq: Every day | ORAL | Status: AC
  Administered 2023-05-09: 20 mg via ORAL
  Filled 2023-05-08: qty 1

## 2023-05-08 NOTE — Evaluation (Signed)
Physical Therapy Evaluation Patient Details Name: [name] MRN: [id number] DOB: 1946/02/13 Today's Date: 05/08/2023  History of Present Illness  [name] is a 77 y.o. female with medical history significant of COPD (on 2-3L oxygen), hypertension, hyperlipidemia, PVD, CAD, hypothyroidism, GERD, depression with anxiety, anemia, former smoker, medication noncompliance, who presents with shortness of breath.  Clinical Impression  Pt is a pleasant 77 year old female who was admitted for a COPD exacerbation. Pt performs bed mobility independently. Performs transfers and ambulation with min guard + RW. Ambulated 36ft to the chair, very anxious about movement, stated " I can collapse at anytime". Patient ambulated to the chair but was unable to stay due to anxiety, returned patient to bed. On 2L of O2 throughout the session with Spo2 95%. BP sitting EOB 86/45 non symptomatic, standing 98/62 non-symptomatic, and 104/56 laying, HOB elevated. Mentioned intermittent dizziness that she stated was her baseline and worse with head turning. Pt demonstrates deficits with balance, mobility, ROM, ambulation, transfers and safe DME use. Called nursing for patient request of anti-anxiety meds, left patient in bed with alarm/call bell and nursing in room. Pt will continue to receive skilled PT services while admitted and will defer to TOC/care team for updates regarding disposition planning.      Recommendations for follow up therapy are one component of a multi-disciplinary discharge planning process, led by the attending physician.  Recommendations may be updated based on patient status, additional functional criteria and insurance authorization.  Follow Up Recommendations       Assistance Recommended at Discharge Intermittent Supervision/Assistance  Patient can return home with the following  A little help with walking and/or transfers;A little help with bathing/dressing/bathroom;Assistance with  cooking/housework;Direct supervision/assist for medications management;Direct supervision/assist for financial management;Assist for transportation;Help with stairs or ramp for entrance    Equipment Recommendations None recommended by PT  Recommendations for Other Services  OT consult    Functional Status Assessment Patient has had a recent decline in their functional status and demonstrates the ability to make significant improvements in function in a reasonable and predictable amount of time.     Precautions / Restrictions Precautions Precautions: Fall Restrictions Weight Bearing Restrictions: No      Mobility  Bed Mobility Overal bed mobility: Independent             General bed mobility comments: Patient able to rolling, supine <> sit, and scoot HOB independently.    Transfers Overall transfer level: Needs assistance Equipment used: Rolling walker (2 wheels) Transfers: Sit to/from Stand Sit to Stand: Min guard           General transfer comment: Patient able to stand with min guard + RW. Patient requested +2 guard due to anxiety. Transferred to chair but patient refused to stay due to feeling anxious, returned patient to bed.    Ambulation/Gait Ambulation/Gait assistance: Min guard Gait Distance (Feet): 8 Feet Assistive device: Rolling walker (2 wheels) Gait Pattern/deviations: Step-through pattern, Trunk flexed       General Gait Details: Expresses anxiety at ambulating without guarding. States that she randomly collpases.  Stairs            Wheelchair Mobility    Modified Rankin (Stroke Patients Only)       Balance Overall balance assessment: Needs assistance Sitting-balance support: Bilateral upper extremity supported, Feet supported Sitting balance-Leahy Scale: Fair     Standing balance support: Bilateral upper extremity supported, During functional activity, Reliant on assistive device for balance Standing balance-Leahy Scale:  Fair                                Pertinent Vitals/Pain Pain Assessment Pain Assessment: No/denies pain    Home Living Family/patient expects to be discharged to:: Private residence Living Arrangements: Alone Available Help at Discharge: Personal care attendant;Available PRN/intermittently Type of Home: House Home Access: Stairs to enter Entrance Stairs-Rails: Left Entrance Stairs-Number of Steps: 4   Home Layout: One level Home Equipment: Agricultural consultant (2 wheels);Rollator (4 wheels);Shower seat;Cane - single point Additional Comments: Patient confirmed information from prior documentation.    Prior Function Prior Level of Function : Needs assist  Cognitive Assist : ADLs (cognitive)   ADLs (Cognitive): Intermittent cues Physical Assist : ADLs (physical);Mobility (physical) Mobility (physical): Stairs ADLs (physical): Bathing;Grooming;Dressing;IADLs Mobility Comments: ambualtes with rollator at home, has a caretaker who takes care of the household and assists her with bathing. ADLs Comments: Aid assists with ADLs/IADLs     Hand Dominance   Dominant Hand: Right    Extremity/Trunk Assessment   Upper Extremity Assessment Upper Extremity Assessment: Overall WFL for tasks assessed    Lower Extremity Assessment Lower Extremity Assessment: Overall WFL for tasks assessed       Communication   Communication: HOH  Cognition Arousal/Alertness: Awake/alert Behavior During Therapy: Anxious, WFL for tasks assessed/performed Overall Cognitive Status: Within Functional Limits for tasks assessed                                 General Comments: Patient extremely anxious about movement with out external guarding/support.        General Comments      Exercises     Assessment/Plan    PT Assessment Patient needs continued PT services  PT Problem List Decreased strength;Decreased range of motion;Decreased activity tolerance;Decreased balance;Decreased  mobility;Decreased coordination;Decreased knowledge of use of DME;Decreased safety awareness       PT Treatment Interventions DME instruction;Gait training;Stair training;Functional mobility training;Therapeutic activities;Therapeutic exercise;Balance training    PT Goals (Current goals can be found in the Care Plan section)  Acute Rehab PT Goals Patient Stated Goal: To go home PT Goal Formulation: With patient Time For Goal Achievement: 05/22/23 Potential to Achieve Goals: Fair    Frequency Min 2X/week     Co-evaluation               AM-PAC PT "6 Clicks" Mobility  Outcome Measure Help needed turning from your back to your side while in a flat bed without using bedrails?: None Help needed moving from lying on your back to sitting on the side of a flat bed without using bedrails?: None Help needed moving to and from a bed to a chair (including a wheelchair)?: None Help needed standing up from a chair using your arms (e.g., wheelchair or bedside chair)?: A Little Help needed to walk in hospital room?: A Little Help needed climbing 3-5 steps with a railing? : A Little 6 Click Score: 21    End of Session Equipment Utilized During Treatment: Gait belt;Oxygen Activity Tolerance: Patient tolerated treatment well;Other (comment) (Patient began experiencing at panic attack post ambulation.) Patient left: in bed;with call bell/phone within reach;with bed alarm set;with nursing/sitter in room Nurse Communication: Mobility status;Other (comment) (patient request for anxiety meds.) PT Visit Diagnosis: Unsteadiness on feet (R26.81);Other abnormalities of gait and mobility (R26.89);History of falling (Z91.81);Difficulty in walking, not elsewhere classified (R26.2);Dizziness  and giddiness (R42)    Time: 9147-8295 PT Time Calculation (min) (ACUTE ONLY): 41 min   Charges:   PT Evaluation $PT Eval Low Complexity: 1 Low PT Treatments $Gait Training: 23-37 mins        Malachi Carl,  SPT   Malachi Carl 05/08/2023, 3:15 PM

## 2023-05-08 NOTE — TOC Progression Note (Signed)
Transition of Care [facility]) - Progression Note    Patient Details  Name: [name] MRN: [id number] Date of Birth: 03-Jul-1946  [facility] of [facility]) CM/SW Contact  Garret Reddish, RN Phone Number: 05/08/2023, 9:04 PM  Clinical Narrative:   Chart reviewed. Noted that patient was a discharge for today. I have spoken with Mrs. Tapani.  She informs me that prior to admission she lived at home by herself.  She informs me that she has caregivers that she pays privately to assist her with her care at home. She reports that she has a care giver that comes in the morning and in the afternoon.   Patient informs me that she has a walker with a seat, wheelchair, BSC and a shower chair at home.    I have spoken with Mrs. Spagnoli about home health services.  Mrs. Redner was agreeable and did not have a home health preference.  I have asked Adelina Mings with [facility] to accept home care referral. Home Health services are [facility] PT, OT, RN and in home aide.    I have called and spoken with Harlon Flor patient's caregiver.  Annice Pih informs me that she will not be available to assist with Mrs. [place] care until Thursday.  Annice Pih reports that she assist Mrs. Ortmann daily in the mornings and in the afternoons.  She reports that Mrs. Giorgi grandson is out of town this week.    I have informed patient of this information. Patient reports that she did have a back up caregiver but that person on longer works for her anymore as of last week.  Mrs. Bowhay has requested to remain in the hospital until her caregiver will be able to assist with  her care at home.  Patient will need EMS transport home on discharge.    I have informed Dr. Clide Dales that discharge would be unsafe at this time due to no caregiver support in the home until Thursday.    TOC will continue to follow for discharge planning.            Expected Discharge Plan and Services                                               Social Determinants of  Health (SDOH) Interventions SDOH Screenings   Food Insecurity: No Food Insecurity (05/07/2023)  Housing: Low Risk  (05/07/2023)  Transportation Needs: No Transportation Needs (05/07/2023)  Utilities: Not At Risk (05/07/2023)  Depression (PHQ2-9): Low Risk  (07/13/2021)  Tobacco Use: High Risk (05/06/2023)    Readmission Risk Interventions    01/31/2023   11:49 AM 07/05/2022   10:50 AM 11/25/2021    9:50 AM  Readmission Risk Prevention Plan  Transportation Screening Complete Complete Complete  Medication Review Oceanographer) Complete Complete Complete  PCP or Specialist appointment within 3-5 days of discharge Complete  Complete  HRI or Home Care Consult  Complete Complete  SW Recovery Care/Counseling Consult Complete Complete   Palliative Care Screening Not Applicable  Not Applicable  Skilled Nursing Facility Not Applicable Not Applicable Not Applicable

## 2023-05-08 NOTE — Progress Notes (Addendum)
  Progress Note   Patient: [name] [id number]:[id number] DOB: 10/27/1946 DOA: 05/06/2023     0 DOS: the patient was seen and examined on 05/08/2023   Brief hospital course: [name] is a 77 year old female with history of anxiety, hypothyroid, advanced COPD, hyperlipidemia, hypertension, who presents to the emergency department for chief concerns of shortness of breath.  Patient is admitted to hospitalist service for further management evaluation of COPD exacerbation.  Assessment and Plan: * COPD exacerbation (HCC) Prednisone 40 changed to 20 for 2 days.. Maintain SpO2 greater than 92%, she is on 2 L supplemental oxygen which is at home dose. Pro-Cal negative. Azithromycin 250 mg x 3 days. Continue antitussive, bronchodilators. Encourage incentive spirometry. PT evaluation, out of bed.  CAD (coronary artery disease) Continue aspirin, statin therapy.  Essential hypertension Continue home amlodipine 5 mg daily, metoprolol tartrate 25 mg p.o. twice daily Hydralazine 5 mg IV every 6 hours as needed for SBP greater 175.  HLD (hyperlipidemia) Atorvastatin 10 mg nightly resumed  Hypothyroidism Continue Levothyroxine 50 mcg qam.  Anxiety and depression Seems very anxious, getting on and off panic attacks causing more respiratory distress. Continue home dose trazodone, hydroxyzine 25 mg 3 times daily as needed for anxiety; She has on pain contract as reported on her file, will avoid benzos.  Protein-calorie malnutrition, severe (HCC) Continue dietary supplementation.  Fall, aspiration precautions. DVT prophylaxis with SCD. CODE STATUS full code. No care giver till Thursday, TOC working on safe [facility].      Subjective: Patient is seen and examined today morning.  Feels anxious about discharge. Wants me to order Xanax, extra dose opiates. Does not wish to go home as her care giver is not available till Thursday.   Physical Exam: Vitals:   05/07/23 2123 05/07/23 2313  05/08/23 0723 05/08/23 1449  BP: 137/78 (!) 141/72 (!) 148/75 122/68  Pulse: 93 81 82 89  Resp: (!) 21 18 16 16   Temp: 97.7 F (36.5 C) (!) 97.5 F (36.4 C) 98.3 F (36.8 C) 98.2 F (36.8 C)  TempSrc: Oral     SpO2: 100% 100% 100% 100%  Weight:       General - Elderly thin built Caucasian female, in distress-due to pain, anxiety HEENT - PERRLA, EOMI, atraumatic head, non tender sinuses. Lung - decreased breath sounds, diffuse wheezes. Heart - S1, S2 heard, no murmurs, rubs, no pedal edema Neuro - Alert, awake and oriented x 3, non focal exam. Skin - Warm and dry. Data Reviewed:  There are no new results to review at this time.  Family Communication: Patient does not want to go home without care giver, feels anxious about going home, donot think she is ready to go home.  Disposition: Status is: Observation The patient remains OBS appropriate and will d/c before 2 midnights.  Planned Discharge Destination: Barriers to discharge: Care giver not at home, high risk for readmission. No PCP, pain control, very anxious    Time spent: 44 minutes  Author: Marcelino Duster, MD 05/08/2023 3:56 PM  For on call review [email or web address].

## 2023-05-09 DIAGNOSIS — R64 Cachexia: Secondary | ICD-10-CM | POA: Diagnosis present

## 2023-05-09 DIAGNOSIS — E876 Hypokalemia: Secondary | ICD-10-CM | POA: Diagnosis not present

## 2023-05-09 DIAGNOSIS — F03918 Unspecified dementia, unspecified severity, with other behavioral disturbance: Secondary | ICD-10-CM | POA: Diagnosis present

## 2023-05-09 DIAGNOSIS — M79671 Pain in right foot: Secondary | ICD-10-CM | POA: Diagnosis not present

## 2023-05-09 DIAGNOSIS — Z681 Body mass index (BMI) 19 or less, adult: Secondary | ICD-10-CM | POA: Diagnosis not present

## 2023-05-09 DIAGNOSIS — I739 Peripheral vascular disease, unspecified: Secondary | ICD-10-CM | POA: Diagnosis present

## 2023-05-09 DIAGNOSIS — E785 Hyperlipidemia, unspecified: Secondary | ICD-10-CM | POA: Diagnosis present

## 2023-05-09 DIAGNOSIS — R0602 Shortness of breath: Secondary | ICD-10-CM | POA: Diagnosis present

## 2023-05-09 DIAGNOSIS — F0393 Unspecified dementia, unspecified severity, with mood disturbance: Secondary | ICD-10-CM | POA: Diagnosis present

## 2023-05-09 DIAGNOSIS — Z1330 Encounter for screening examination for mental health and behavioral disorders, unspecified: Secondary | ICD-10-CM | POA: Diagnosis not present

## 2023-05-09 DIAGNOSIS — F32A Depression, unspecified: Secondary | ICD-10-CM | POA: Diagnosis present

## 2023-05-09 DIAGNOSIS — I2510 Atherosclerotic heart disease of native coronary artery without angina pectoris: Secondary | ICD-10-CM | POA: Diagnosis present

## 2023-05-09 DIAGNOSIS — J449 Chronic obstructive pulmonary disease, unspecified: Secondary | ICD-10-CM | POA: Diagnosis present

## 2023-05-09 DIAGNOSIS — R627 Adult failure to thrive: Secondary | ICD-10-CM | POA: Diagnosis present

## 2023-05-09 DIAGNOSIS — G9341 Metabolic encephalopathy: Secondary | ICD-10-CM | POA: Diagnosis present

## 2023-05-09 DIAGNOSIS — G894 Chronic pain syndrome: Secondary | ICD-10-CM | POA: Diagnosis present

## 2023-05-09 DIAGNOSIS — J441 Chronic obstructive pulmonary disease with (acute) exacerbation: Secondary | ICD-10-CM | POA: Diagnosis present

## 2023-05-09 DIAGNOSIS — J9621 Acute and chronic respiratory failure with hypoxia: Secondary | ICD-10-CM | POA: Diagnosis present

## 2023-05-09 DIAGNOSIS — I10 Essential (primary) hypertension: Secondary | ICD-10-CM | POA: Diagnosis present

## 2023-05-09 DIAGNOSIS — Z9981 Dependence on supplemental oxygen: Secondary | ICD-10-CM | POA: Diagnosis not present

## 2023-05-09 DIAGNOSIS — K219 Gastro-esophageal reflux disease without esophagitis: Secondary | ICD-10-CM | POA: Diagnosis present

## 2023-05-09 DIAGNOSIS — E43 Unspecified severe protein-calorie malnutrition: Secondary | ICD-10-CM | POA: Diagnosis present

## 2023-05-09 DIAGNOSIS — F17210 Nicotine dependence, cigarettes, uncomplicated: Secondary | ICD-10-CM | POA: Diagnosis present

## 2023-05-09 DIAGNOSIS — Z79899 Other long term (current) drug therapy: Secondary | ICD-10-CM | POA: Diagnosis not present

## 2023-05-09 DIAGNOSIS — F0392 Unspecified dementia, unspecified severity, with psychotic disturbance: Secondary | ICD-10-CM | POA: Diagnosis present

## 2023-05-09 DIAGNOSIS — F0394 Unspecified dementia, unspecified severity, with anxiety: Secondary | ICD-10-CM | POA: Diagnosis present

## 2023-05-09 DIAGNOSIS — E039 Hypothyroidism, unspecified: Secondary | ICD-10-CM | POA: Diagnosis present

## 2023-05-09 MED ORDER — PREDNISONE 20 MG PO TABS
20.0000 mg | ORAL_TABLET | Freq: Every day | ORAL | Status: AC
  Administered 2023-05-10: 20 mg via ORAL
  Filled 2023-05-09: qty 1

## 2023-05-09 NOTE — TOC Progression Note (Signed)
Transition of Care [facility]) - Progression Note    Patient Details  Name: [name] MRN: [id number] Date of Birth: [birth year]  Transition of Care [facility]) CM/SW Contact  Garret Reddish, RN Phone Number: 05/09/2023, 10:08 AM  Clinical Narrative:   Chart reviewed.  I have spoken with patient this morning.  She informs me that she is not able to secure a caregiver till Thursday.  I have left message for caregiver Annice Pih to confirm that she will be available to assist with patient's needs on Thursday.  I have arranged Home Health services with [facility].    TOC will continue to follow for discharge planning.         Expected Discharge Plan and Services                                               Social Determinants of Health (SDOH) Interventions SDOH Screenings   Food Insecurity: No Food Insecurity (05/07/2023)  Housing: Low Risk  (05/07/2023)  Transportation Needs: No Transportation Needs (05/07/2023)  Utilities: Not At Risk (05/07/2023)  Depression (PHQ2-9): Low Risk  (07/13/2021)  Tobacco Use: High Risk (05/06/2023)    Readmission Risk Interventions    01/31/2023   11:49 AM 07/05/2022   10:50 AM 11/25/2021    9:50 AM  Readmission Risk Prevention Plan  Transportation Screening Complete Complete Complete  Medication Review Oceanographer) Complete Complete Complete  PCP or Specialist appointment within 3-5 days of discharge Complete  Complete  HRI or Home Care Consult  Complete Complete  SW Recovery Care/Counseling Consult Complete Complete   Palliative Care Screening Not Applicable  Not Applicable  Skilled Nursing Facility Not Applicable Not Applicable Not Applicable

## 2023-05-09 NOTE — Plan of Care (Signed)

## 2023-05-09 NOTE — Progress Notes (Signed)
PROGRESS NOTE  [name]    DOB: 1946-09-18, 77 y.o.  ZOX:[id number]    Code Status: Full Code   DOA: 05/06/2023   LOS: 0   Brief hospital course  [name] is a 77 y.o. female with a PMH significant for anxiety, hypothyroid, advanced COPD, hyperlipidemia, hypertension.  They presented from home to the ED on 05/06/2023 with SOB x 2 days with increased phlegm production. She is a former tobacco user. At her peak she smoked 2 packs/day. She denies EtOH and recreational drug use. At baseline she is minimally ambulatory and lives alone but has daily caregivers.   In the ED, it was found that they had temperature 97.9, respiration rate of 19, heart rate 141, blood pressure 163/99, SpO2 96% on 2 L nasal cannula.  Significant findings included sodium is 143, potassium 4.0, chloride 107, bicarb 26, BUN of 8, serum creatinine of 0.53, eGFR greater than 60, nonfasting blood glucose 119, WBC 9.7, hemoglobin 12.3, platelets of 378.High sensitive troponin was 10.  Chest x-ray: Read as emphysematous changes without acute cardiopulmonary process.   They were initially treated with DuoNebs one-time treatment, Solu-Medrol 40 mg IV one-time dose, metoprolol 25 mg p.o. one-time dose, sodium chloride 1 L bolus.   Patient was admitted to medicine service for further workup and management of COPD exacerbation as outlined in detail below.  05/09/23 -stable, improved  Assessment & Plan  Principal Problem:   COPD exacerbation (HCC) Active Problems:   Chronic pain syndrome   CAD (coronary artery disease)   Essential hypertension   Underweight   HLD (hyperlipidemia)   Hypothyroidism   Anxiety and depression   Protein-calorie malnutrition, severe (HCC)   PAD (peripheral artery disease) (HCC)   Acute on chronic respiratory failure with hypoxemia (HCC)  COPD exacerbation (HCC)- had increase in oxygen dependence from baseline 2.5L. On 4L this morning with no respiratory complaints. S/p Solu-Medrol 40  mg IV  Cough is improved and she feels at baseline.  - continue DuoNebs PRN - Maintain SpO2 greater than 88% - continue azithromycin (4 days total completed 6/19) - continue dulera BID - continue prednisone pack - ambulate with pulse ox, wean to baseline as able   CAD  HLD Atorvastatin 10 mg nightly resumed   Essential hypertension- moderately well controlled Resumed home amlodipine 5 mg daily, metoprolol tartrate 25 mg p.o. twice daily   Hypothyroidism Levothyroxine 50 mcg every morning resumed   Anxiety and depression Home trazodone 300 mg nightly resumed Home hydroxyzine 25 mg 3 times daily as needed for anxiety   Protein-calorie malnutrition, severe (HCC) Home Ensure between meals resumed  Body mass index is 15.29 kg/m.  VTE ppx: enoxaparin (LOVENOX) injection 40 mg Start: 05/06/23 2200 Place TED hose Start: 05/06/23 1755  Diet:     Diet   Diet regular Fluid consistency: Thin   Consultants: None   Subjective 05/09/23    Pt reports feeling well. She is worried about going home due to her nephew being away at the beach and he normally helps her at home. She also has a caregiver who is watching her dog and drives her to follow up appointments. Denies SOB, chest pain, dyspnea.    Objective   Vitals:   05/07/23 2313 05/08/23 0723 05/08/23 1449 05/08/23 2349  BP: (!) 141/72 (!) 148/75 122/68 124/77  Pulse: 81 82 89 69  Resp: 18 16 16 20   Temp: (!) 97.5 F (36.4 C) 98.3 F (36.8 C) 98.2 F (36.8 C) 97.7  F (36.5 C)  TempSrc:      SpO2: 100% 100% 100% 100%  Weight:        Intake/Output Summary (Last 24 hours) at 05/09/2023 0743 Last data filed at 05/09/2023 0658 Gross per 24 hour  Intake 720 ml  Output 1550 ml  Net -830 ml   Filed Weights   05/07/23 0015  Weight: 40.4 kg     Physical Exam:  General: awake, alert, NAD. Frail HEENT: atraumatic, clear conjunctiva, anicteric sclera, MMM, hard of hearing Respiratory: normal respiratory effort.  CTAB Cardiovascular: quick capillary refill Nervous: A&O x3. no gross focal neurologic deficits, normal speech Extremities: moves all equally, no edema, normal tone. S/p toe amputations Skin: dry, intact, normal temperature, normal color. No rashes, lesions or ulcers on exposed skin Psychiatry: normal mood, congruent affect. Mildly anxious. Flight of ideas  Labs   I have personally reviewed the following labs and imaging studies CBC    Component Value Date/Time   WBC 5.4 05/07/2023 0415   RBC 3.57 (L) 05/07/2023 0415   HGB 10.5 (L) 05/07/2023 0415   HCT 33.7 (L) 05/07/2023 0415   PLT 285 05/07/2023 0415   MCV 94.4 05/07/2023 0415   MCH 29.4 05/07/2023 0415   MCHC 31.2 05/07/2023 0415   RDW 14.4 05/07/2023 0415   LYMPHSABS 1.8 05/06/2023 1605   MONOABS 0.6 05/06/2023 1605   EOSABS 0.9 (H) 05/06/2023 1605   BASOSABS 0.1 05/06/2023 1605      Latest Ref Rng & Units 05/07/2023    4:15 AM 05/06/2023    4:05 PM 03/13/2023   12:52 PM  BMP  Glucose 70 - 99 mg/dL 098  119  97   BUN 8 - 23 mg/dL 10  8  17    Creatinine 0.44 - 1.00 mg/dL 1.47  8.29  5.62   Sodium 135 - 145 mmol/L 143  143  139   Potassium 3.5 - 5.1 mmol/L 4.0  4.0  3.7   Chloride 98 - 111 mmol/L 110  107  104   CO2 22 - 32 mmol/L 25  26  27    Calcium 8.9 - 10.3 mg/dL 8.3  8.8  8.5    No results found.  Disposition Plan & Communication  Patient status: Observation  Admitted From: Home Planned disposition location: Home Anticipated discharge date: 6/20 pending safe dispo plan. Has no caregiver available until 6/20. Medically ready for dc  Family Communication: none at bedside    Author: Leeroy Bock, DO [facility] 05/09/2023, 7:43 AM   Available by Epic secure chat 7AM-7PM. If 7PM-7AM, please contact night-coverage.  TRH contact information found on [email or web address].

## 2023-05-10 DIAGNOSIS — J441 Chronic obstructive pulmonary disease with (acute) exacerbation: Secondary | ICD-10-CM | POA: Diagnosis not present

## 2023-05-10 MED ORDER — AMLODIPINE BESYLATE 5 MG PO TABS: 5.0000 mg | ORAL_TABLET | Freq: Every day | ORAL | 1 refills | Status: DC

## 2023-05-10 MED ORDER — ENOXAPARIN SODIUM 30 MG/0.3ML IJ SOSY
30.0000 mg | PREFILLED_SYRINGE | INTRAMUSCULAR | Status: DC
  Administered 2023-05-10 – 2023-05-20 (×11): 30 mg via SUBCUTANEOUS
  Filled 2023-05-10 (×11): qty 0.3

## 2023-05-10 MED ORDER — OXYCODONE-ACETAMINOPHEN 5-325 MG PO TABS
2.0000 | ORAL_TABLET | Freq: Four times a day (QID) | ORAL | Status: DC | PRN
  Administered 2023-05-10 – 2023-05-12 (×6): 2 via ORAL
  Filled 2023-05-10 (×6): qty 2

## 2023-05-10 NOTE — TOC Progression Note (Addendum)
Transition of Care [facility]) - Progression Note    Patient Details  Name: [name] MRN: [id number] Date of Birth: [birth year]  Transition of [facility]) CM/SW Contact  Garret Reddish, RN Phone Number: 05/10/2023, 2:28 PM  Clinical Narrative:   Chart reviewed.  Nursing team has attempted multiple times to contact patient 's caregiver Annice Pih.   I have also contacted Annice Pih also several times today.  I have spoken with Mrs. Rackow and she informs me that she has not been able to get in contact with Annice Pih.  She also informs me that Annice Pih was supposed to be watching her dog.   I have asked Mrs.Caillier did she have any alternative options to assist in her discharge home. Mrs. Burdine reports that she has no other persons that can assist her with needs at home.  She reports that she did have a second caregiver but that person no longer works for her.  I have spoken to Mrs. Crehan about placement options and she informs me that she will not be going to any SNF.  She reports that she is going home.  She then reports that she has Medicare and why is she not able to stay in the hospital.  I explained to her that she is stable for discharge.  She goes on to inform me that Annice Pih should have been taking care of her dog and if Annice Pih has not taken care of her dog she is going to be in a lot of trouble.  She has given me permission to speak with her grandson.   I have spoken with patient's grandson Roland Earl.  Roland Earl informs me that he is 17 and he is currently Mrs. Bina Saviano and he handles her fiances.  He reports that his father is Mr. Barbush son.  He reports that she is estranged from all her children.  Roland Earl informs me that Mrs. Bains has ran off most of the caregivers that she has had.  He reports that she will accuse the caregivers of stealing her medications and calls the police on them or reports them to [facility].  Roland Earl reports that Annice Pih is the only caregiver that has stayed and worked with Mrs. Dingmann.  Roland Earl reports that  Mrs. Zurfluh will use the bathroom on herself and will not allow the caregivers to clean her.  They also try and do other house hold duties and she will not allow certain things to be done.  Roland Earl reports that he would like to see her in a facility where she could get the help that she needs but she refuses to go any where. He reports that he has called DSS and APS for assistance and he reports that they will investigate the claims and nothing is ever done.  He is also very frustrated with Mrs. Stepanian actions and is considering giving up his rights as her POA because she will not cooperative with many of the recommendations for her care.  Roland Earl is at the [facility] this week and will call and speak with Mrs. Hutchins about her disposition.  He reports that he will also attempt to call Annice Pih.    I have called and left a voice message for APS to see if their is an active case open for Mrs. Gilles.  I have asked Dr. Dareen Piano to consider ordering a capacity evaluation.    Patient is not safe to discharge home at this time.  Discharge has been canceled at this time.    TOC  will continue to follow for discharge planning.             Expected Discharge Plan and Services         Expected Discharge Date: 05/10/23                                     Social Determinants of Health (SDOH) Interventions SDOH Screenings   Food Insecurity: No Food Insecurity (05/07/2023)  Housing: Low Risk  (05/07/2023)  Transportation Needs: No Transportation Needs (05/07/2023)  Utilities: Not At Risk (05/07/2023)  Depression (PHQ2-9): Low Risk  (07/13/2021)  Tobacco Use: High Risk (05/06/2023)    Readmission Risk Interventions    01/31/2023   11:49 AM 07/05/2022   10:50 AM 11/25/2021    9:50 AM  Readmission Risk Prevention Plan  Transportation Screening Complete Complete Complete  Medication Review Oceanographer) Complete Complete Complete  PCP or Specialist appointment within 3-5 days of discharge Complete   Complete  HRI or Home Care Consult  Complete Complete  SW Recovery Care/Counseling Consult Complete Complete   Palliative Care Screening Not Applicable  Not Applicable  Skilled Nursing Facility Not Applicable Not Applicable Not Applicable

## 2023-05-10 NOTE — Consult Note (Signed)
PHARMACIST - PHYSICIAN COMMUNICATION  CONCERNING:  Enoxaparin (Lovenox) for DVT Prophylaxis    RECOMMENDATION: Patient was prescribed enoxaprin 40mg  q24 hours for VTE prophylaxis.   Filed Weights   05/07/23 0015  Weight: 40.4 kg (89 lb 1.1 oz)    Body mass index is 15.29 kg/m.  Estimated Creatinine Clearance: 38.8 mL/min (by C-G formula based on SCr of 0.49 mg/dL).  Patient is candidate for enoxaparin 30mg  every 24 hours based on Weight <45kg  DESCRIPTION: Pharmacy has adjusted enoxaparin dose per [facility] policy.  Patient is now receiving enoxaparin 30 mg every 24 hours   Celene Squibb, PharmD Clinical Pharmacist 05/10/2023 12:15 PM

## 2023-05-10 NOTE — Discharge Instructions (Signed)
Please follow up with your primary doctor about your hospital stay. They can check that your respiratory is still good and evaluate  your foot

## 2023-05-10 NOTE — Progress Notes (Signed)
PROGRESS NOTE  [name]    DOB: 1946/05/21, 77 y.o.  WUJ:[id number]    Code Status: Full Code   DOA: 05/06/2023   LOS: 1   Brief hospital course  [name] is a 77 y.o. female with a PMH significant for anxiety, hypothyroid, advanced COPD, hyperlipidemia, hypertension.  They presented from home to the ED on 05/06/2023 with SOB x 2 days with increased phlegm production. She is a former tobacco user. At her peak she smoked 2 packs/day. She denies EtOH and recreational drug use. At baseline she is minimally ambulatory and lives alone but has daily caregivers.   In the ED, it was found that they had temperature 97.9, respiration rate of 19, heart rate 141, blood pressure 163/99, SpO2 96% on 2 L nasal cannula.  Significant findings included sodium is 143, potassium 4.0, chloride 107, bicarb 26, BUN of 8, serum creatinine of 0.53, eGFR greater than 60, nonfasting blood glucose 119, WBC 9.7, hemoglobin 12.3, platelets of 378.High sensitive troponin was 10.  Chest x-ray: Read as emphysematous changes without acute cardiopulmonary process.   They were initially treated with DuoNebs one-time treatment, Solu-Medrol 40 mg IV one-time dose, metoprolol 25 mg p.o. one-time dose, sodium chloride 1 L bolus.   Patient was admitted to medicine service for further workup and management of COPD exacerbation as outlined in detail below.  05/10/23 -stable, improved to her baseline. Awaiting discharge due to unsafe dispo plan.   Assessment & Plan  Principal Problem:   COPD exacerbation (HCC) Active Problems:   Chronic pain syndrome   CAD (coronary artery disease)   Essential hypertension   Underweight   HLD (hyperlipidemia)   Hypothyroidism   Anxiety and depression   Protein-calorie malnutrition, severe (HCC)   PAD (peripheral artery disease) (HCC)   Acute on chronic respiratory failure with hypoxemia (HCC)   COPD (chronic obstructive pulmonary disease) (HCC)  COPD exacerbation (HCC)- had  increase in oxygen dependence from baseline 2.5L. On 4L and now 2L this morning with no respiratory complaints. S/p Solu-Medrol 40 mg IV  Cough is improved and she feels at baseline.  - continue DuoNebs PRN - Maintain SpO2 greater than 88% - continue azithromycin (4 days total completed 6/19) - continue dulera BID - continue prednisone pack - ambulate with pulse ox, wean to baseline as able   CAD  HLD Atorvastatin 10 mg nightly resumed   Essential hypertension- moderately well controlled Resumed home amlodipine 5 mg daily, metoprolol tartrate 25 mg p.o. twice daily   Hypothyroidism Levothyroxine 50 mcg every morning resumed   Anxiety and depression Home trazodone 300 mg nightly resumed Home hydroxyzine 25 mg 3 times daily as needed for anxiety   Protein-calorie malnutrition, severe (HCC) Home Ensure between meals resumed  Body mass index is 15.29 kg/m.  VTE ppx: enoxaparin (LOVENOX) injection 40 mg Start: 05/06/23 2200 Place TED hose Start: 05/06/23 1755  Diet:     Diet   Diet regular Fluid consistency: Thin   Consultants: None   Subjective 05/10/23    Pt reports feeling well. She denies respiratory distress. Complains of chronic R foot pain. Trying to get ahold of her caregiver to see if she can be available for her discharge.    Objective   Vitals:   05/08/23 2349 05/09/23 0754 05/09/23 1539 05/09/23 2353  BP: 124/77 (!) 158/85 131/68 115/64  Pulse: 69 89 85 73  Resp: 20 17 17 20   Temp: 97.7 F (36.5 C) 97.8 F (36.6 C) 98.1 F (  36.7 C) 98.5 F (36.9 C)  TempSrc:      SpO2: 100% 100% 100% 98%  Weight:       No intake or output data in the 24 hours ending 05/10/23 0720  Filed Weights   05/07/23 0015  Weight: 40.4 kg     Physical Exam:  General: awake, alert, NAD. Frail HEENT: atraumatic, clear conjunctiva, anicteric sclera, MMM, hard of hearing Respiratory: normal respiratory effort. CTAB Cardiovascular: quick capillary refill Nervous: A&O x3.  no gross focal neurologic deficits, normal speech Extremities: moves all equally, no edema, normal tone. S/p toe amputations Skin: dry, intact, normal temperature, normal color. No rashes, lesions or ulcers on exposed skin Psychiatry: normal mood, congruent affect. Mildly anxious. Flight of ideas  Labs   I have personally reviewed the following labs and imaging studies CBC    Component Value Date/Time   WBC 5.4 05/07/2023 0415   RBC 3.57 (L) 05/07/2023 0415   HGB 10.5 (L) 05/07/2023 0415   HCT 33.7 (L) 05/07/2023 0415   PLT 285 05/07/2023 0415   MCV 94.4 05/07/2023 0415   MCH 29.4 05/07/2023 0415   MCHC 31.2 05/07/2023 0415   RDW 14.4 05/07/2023 0415   LYMPHSABS 1.8 05/06/2023 1605   MONOABS 0.6 05/06/2023 1605   EOSABS 0.9 (H) 05/06/2023 1605   BASOSABS 0.1 05/06/2023 1605      Latest Ref Rng & Units 05/07/2023    4:15 AM 05/06/2023    4:05 PM 03/13/2023   12:52 PM  BMP  Glucose 70 - 99 mg/dL 161  096  97   BUN 8 - 23 mg/dL 10  8  17    Creatinine 0.44 - 1.00 mg/dL 0.45  4.09  8.11   Sodium 135 - 145 mmol/L 143  143  139   Potassium 3.5 - 5.1 mmol/L 4.0  4.0  3.7   Chloride 98 - 111 mmol/L 110  107  104   CO2 22 - 32 mmol/L 25  26  27    Calcium 8.9 - 10.3 mg/dL 8.3  8.8  8.5    No results found.  Disposition Plan & Communication  Patient status: Inpatient  Admitted From: Home Planned disposition location: Home Anticipated discharge date: 6/21 pending safe dispo plan. Has no caregiver available until 6/21. Medically ready for dc  Family Communication: none at bedside    Author: Leeroy Bock, DO [facility] 05/10/2023, 7:20 AM   Available by Epic secure chat 7AM-7PM. If 7PM-7AM, please contact night-coverage.  TRH contact information found on [email or web address].

## 2023-05-10 NOTE — Progress Notes (Signed)
Sent Dr. Dareen Piano a page informing her the patient wishes to speak with her.

## 2023-05-10 NOTE — Progress Notes (Signed)
Nutrition Follow-up  DOCUMENTATION CODES:   Underweight, Severe malnutrition in context of chronic illness  INTERVENTION:   -Continue MVI with minerals daily -Continue Boost Breeze po TID, each supplement provides 250 kcal and 9 grams of protein  -Continue regular diet -Continue feeding assistance  NUTRITION DIAGNOSIS:   Severe Malnutrition related to chronic illness (COPD) as evidenced by moderate fat depletion, severe fat depletion, moderate muscle depletion, severe muscle depletion, percent weight loss.  Ongoing  GOAL:   Patient will meet greater than or equal to 90% of their needs  Progressing   MONITOR:   PO intake, Supplement acceptance  REASON FOR ASSESSMENT:   Malnutrition Screening Tool    ASSESSMENT:   Pt with history of anxiety, hypothyroid, advanced COPD, hyperlipidemia, hypertension, who presents for chief concerns of shortness of breath.  Reviewed I/O's: +240 ml x 24 hours and -260 ml since admission   Pt sitting up in bed, offers no complaints.   Pt with good appetite. Noted meal completions 50-100%. Pt with variable acceptance of Boost Breeze. SHe does not like Ensure or milky supplements.   No new wt since admission.   Per TOC notes, plan for APS report.   Medications reviewed and include vitamin B-12, melatonin, thiamine, and protonix.   Labs reviewed: CBGS: 107.    Diet Order:   Diet Order             Diet regular Fluid consistency: Thin  Diet effective now                   EDUCATION NEEDS:   Education needs have been addressed  Skin:  Skin Assessment: Reviewed RN Assessment  Last BM:  Unknown  Height:   Ht Readings from Last 1 Encounters:  03/13/23 5\' 4"  (1.626 m)    Weight:   Wt Readings from Last 1 Encounters:  05/07/23 40.4 kg    Ideal Body Weight:  54.5 kg  BMI:  Body mass index is 15.29 kg/m.  Estimated Nutritional Needs:   Kcal:  1600-1800  Protein:  80-95 grams  Fluid:  > 1.6 L    Levada Schilling, RD, LDN, CDCES Registered Dietitian II Certified Diabetes Care and Education Specialist Please refer to [facility] for RD and/or RD on-call/weekend/after hours pager

## 2023-05-10 NOTE — Progress Notes (Addendum)
Physical Therapy Treatment Patient Details Name: [name] MRN: [id number] DOB: 07-Jan-1946 Today's Date: 05/10/2023   History of Present Illness [name] is a 77 y.o. female with medical history significant of COPD (on 2-3L oxygen), hypertension, hyperlipidemia, PVD, CAD, hypothyroidism, GERD, depression with anxiety, anemia, former smoker, medication noncompliance, who presents with shortness of breath.    PT Comments    Pt was long sitting in bed upon arrival. She is A and O but very HOH. Upon arrival, pt was sitting on 4 L o2 but was returned to baseline 3 L and able to maintain O2 saturation >95%. Pt is somewhat self limiting due to anxiety. Overall demonstrated safe abilities to exit bed, stand and ambulate in room without difficulty. Pt is eager to DC home with caregiver. " Im still waiting to hear back from my caregiver. I want to go home today if I don't need any surgery." Pt endorses having all equipment needs met. Pt is well known by author from previous admissions. Do recommend continued skilled PT at DC to maximize her independence and activity tolerance. Will continue to follow per current POC.     Recommendations for follow up therapy are one component of a multi-disciplinary discharge planning process, led by the attending physician.  Recommendations may be updated based on patient status, additional functional criteria and insurance authorization.     Assistance Recommended at Discharge Set up Supervision/Assistance  Patient can return home with the following A little help with walking and/or transfers;A little help with bathing/dressing/bathroom;Assistance with cooking/housework;Direct supervision/assist for medications management;Direct supervision/assist for financial management;Assist for transportation;Help with stairs or ramp for entrance   Equipment Recommendations  None recommended by PT       Precautions / Restrictions Precautions Precautions:  Fall Precaution Comments: O2 Restrictions Weight Bearing Restrictions: No     Mobility  Bed Mobility Overal bed mobility: Modified Independent   Transfers Overall transfer level: Needs assistance Equipment used: Rolling walker (2 wheels) Transfers: Sit to/from Stand Sit to Stand: Supervision   Ambulation/Gait Ambulation/Gait assistance: Supervision Gait Distance (Feet): 20 Feet Assistive device: Rolling walker (2 wheels) Gait Pattern/deviations: Step-through pattern Gait velocity: decreased  General Gait Details: Pt was able to ambulate ~ 20 ft in room. did not want to ambulate outinto hallway. no LOB with use of RW. placed pt on baseline 3 L o2 during session without desaturation. sao2 >95% on 3 L    Balance Overall balance assessment: Modified Independent Sitting-balance support: Feet supported Sitting balance-Leahy Scale: Good  Standing balance support: Bilateral upper extremity supported, During functional activity, Reliant on assistive device for balance Standing balance-Leahy Scale: Good     Cognition Arousal/Alertness: Awake/alert Behavior During Therapy: WFL for tasks assessed/performed Overall Cognitive Status: Within Functional Limits for tasks assessed      General Comments: Pt is HOH but A and O. hopeful to DC home today               Pertinent Vitals/Pain Pain Assessment Pain Assessment: 0-10 Pain Score: 7  Pain Location: BLE/ feet Pain Descriptors / Indicators: Burning, Discomfort Pain Intervention(s): Limited activity within patient's tolerance, Monitored during session, Premedicated before session, Repositioned     PT Goals (current goals can now be found in the care plan section) Acute Rehab PT Goals Patient Stated Goal: To go home Progress towards PT goals: Progressing toward goals    Frequency    Min 2X/week      PT Plan Current plan remains appropriate  AM-PAC PT "6 Clicks" Mobility   Outcome Measure  Help needed  turning from your back to your side while in a flat bed without using bedrails?: None Help needed moving from lying on your back to sitting on the side of a flat bed without using bedrails?: None Help needed moving to and from a bed to a chair (including a wheelchair)?: None Help needed standing up from a chair using your arms (e.g., wheelchair or bedside chair)?: A Little Help needed to walk in hospital room?: A Little Help needed climbing 3-5 steps with a railing? : A Little 6 Click Score: 21    End of Session Equipment Utilized During Treatment: Oxygen (3L) Activity Tolerance: Patient tolerated treatment well Patient left: in chair;with call bell/phone within reach;with chair alarm set Nurse Communication: Mobility status PT Visit Diagnosis: Unsteadiness on feet (R26.81);Other abnormalities of gait and mobility (R26.89);History of falling (Z91.81);Difficulty in walking, not elsewhere classified (R26.2);Dizziness and giddiness (R42)     Time: 1191-4782 PT Time Calculation (min) (ACUTE ONLY): 30 min  Charges:  $Gait Training: 8-22 mins $Therapeutic Activity: 8-22 mins                     Jetta Lout PTA 05/10/23, 9:36 AM

## 2023-05-11 DIAGNOSIS — F03918 Unspecified dementia, unspecified severity, with other behavioral disturbance: Secondary | ICD-10-CM | POA: Diagnosis not present

## 2023-05-11 DIAGNOSIS — Z1330 Encounter for screening examination for mental health and behavioral disorders, unspecified: Secondary | ICD-10-CM | POA: Diagnosis not present

## 2023-05-11 DIAGNOSIS — J441 Chronic obstructive pulmonary disease with (acute) exacerbation: Secondary | ICD-10-CM | POA: Diagnosis not present

## 2023-05-11 NOTE — Progress Notes (Signed)
PT Cancellation Note  Patient Details Name: [name] MRN: [id number] DOB: [birth year]   Cancelled Treatment:     PT attempted to treat pt several times today. Earlier in the day, pt under impression she will be d/cing home and states" I don't want to wear myself out before I go home."  2nd attempt: Pt upset she is not going home today and gets slightly agitated." Im gonna have a panic attack if everyone just doesn't leave me alone." Pt was able to calm down but remained unwilling to participate at this time. Acute PT will continue to follow per current POC until Dc'd    Rushie Chestnut 05/11/2023, 3:46 PM

## 2023-05-11 NOTE — Consult Note (Signed)
[facility] Face-to-Face Psychiatry Consult   Reason for Consult: Consult for 77 year old woman asking about capacity for discharge Referring Physician: Dareen Piano Patient Identification: [name] MRN:  [id number] Principal Diagnosis: COPD exacerbation (HCC) Diagnosis:  Principal Problem:   COPD exacerbation (HCC) Active Problems:   HLD (hyperlipidemia)   Hypothyroidism   Anxiety and depression   Chronic pain syndrome   PAD (peripheral artery disease) (HCC)   Protein-calorie malnutrition, severe (HCC)   Essential hypertension   Underweight   Acute on chronic respiratory failure with hypoxemia (HCC)   CAD (coronary artery disease)   COPD (chronic obstructive pulmonary disease) (HCC)   Dementia with behavioral disturbance (HCC)   Total Time spent with patient: 45 minutes  Subjective:   [name] is a 77 y.o. female patient admitted with "I have COPD".  HPI: Patient seen and chart reviewed.  77 year old woman with a history of chronic medical problems including chronic COPD.  In the hospital after having an episode of shortness of breath with altered mental status at home.  Treatment team is in the process of making discharge plans and are concerned about her capacity to make decisions about discharge.  Patient initially seemed confused saying that she thought she was in "[facility]".  She did know the correct year however and when corrected about being in the hospital became oriented to that situation and could answer questions appropriately.  Patient says she lives by herself at home that she is able to make her own food and that her grandson brings her groceries.  She says she has home health at home but sometimes she gets fed up with them because they rearrange her things.  Patient is able to acknowledge that being at home by herself with her medical conditions entails some danger including the danger of falling hitting her head or dying.  I asked her what she thought about going to  an assisted living facility and she understood that that would mean giving up some of her independence.  She did not want to do that felt like she was able to make her own decisions.  Patient did not make any delusional statements no evidence of psychosis denies suicidal ideation  Past Psychiatric History: Patient mainly has been seen by her primary care doctor who has provided alprazolam as a as needed for anxiety associated with shortness of breath.  No other evidence of mental health care.  Risk to Self:   Risk to Others:   Prior Inpatient Therapy:   Prior Outpatient Therapy:    Past Medical History:  Past Medical History:  Diagnosis Date   Anxiety    CAD (coronary artery disease)    Carpal tunnel syndrome    Cataract    Complication of anesthesia    has woken up at times   COPD (chronic obstructive pulmonary disease) (HCC)    CRP elevated 09/14/2015   Depression    Difficult intubation    Dyslipidemia    Dyspnea    DOE   Dysrhythmia    hx palpatations   Elevated sedimentation rate 09/14/2015   Esophageal spasm    Gastrointestinal parasites    GERD (gastroesophageal reflux disease)    HCAP (healthcare-associated pneumonia) 10/22/2018   Hiatal hernia    History of peptic ulcer disease    Hypertension    Hyperthyroidism    Hypothyroidism    Low magnesium levels 09/14/2015   Pelvic fracture (HCC) 2008   fall from riding a horse   Peripheral vascular disease (  HCC)    Reflux    Rotator cuff injury    Sepsis (HCC) 10/22/2018   Stenosis, spinal, lumbar     Past Surgical History:  Procedure Laterality Date   AMPUTATION TOE Right 12/26/2020   Procedure: AMPUTATION TOE-3rd Toes;  Surgeon: Gwyneth Revels, DPM;  Location: [facility];  Service: Podiatry;  Laterality: Right;   AMPUTATION TOE Right 04/22/2021   Procedure: AMPUTATION TOE- RIGHT 2ND;  Surgeon: Gwyneth Revels, DPM;  Location: [facility];  Service: Podiatry;  Laterality: Right;   AMPUTATION TOE Right 10/21/2021   Procedure:  AMPUTATION TOE;  Surgeon: Rosetta Posner, DPM;  Location: [facility];  Service: Podiatry;  Laterality: Right;   AMPUTATION TOE Right 11/23/2021   Procedure: AMPUTATION TOE;  Surgeon: Linus Galas, DPM;  Location: [facility];  Service: Podiatry;  Laterality: Right;   AMPUTATION TOE Right 07/05/2022   Procedure: AMPUTATION RIGHT 5TH TOE;  Surgeon: Edwin Cap, DPM;  Location: [facility];  Service: Podiatry;  Laterality: Right;   APPENDECTOMY     BACK SURGERY     CERVICAL FUSION   CARPAL TUNNEL RELEASE     CATARACT EXTRACTION W/PHACO Right 08/07/2017   Procedure: CATARACT EXTRACTION PHACO AND INTRAOCULAR LENS PLACEMENT (IOC);  Surgeon: Galen Manila, MD;  Location: [facility];  Service: Ophthalmology;  Laterality: Right;  [place] 00:52.0 AP% 16.8 CDE 8.74 Fluid Pack Lot # [id number]   CATARACT EXTRACTION W/PHACO Left 09/04/2017   Procedure: CATARACT EXTRACTION PHACO AND INTRAOCULAR LENS PLACEMENT (IOC);  Surgeon: Galen Manila, MD;  Location: [facility];  Service: Ophthalmology;  Laterality: Left;  [place] 00:34 AP% 17.0 CDE 5.80 Fluid pack lot # [id number]   CESAREAN SECTION     CHOLECYSTECTOMY     FRACTURE SURGERY     HIP SURGERY     INTRAMEDULLARY (IM) NAIL INTERTROCHANTERIC Right 10/01/2016   Procedure: INTRAMEDULLARY (IM) NAIL INTERTROCHANTRIC;  Surgeon: Christena Flake, MD;  Location: [facility];  Service: Orthopedics;  Laterality: Right;   INTRAMEDULLARY (IM) NAIL INTERTROCHANTERIC Left 01/14/2021   Procedure: INTRAMEDULLARY (IM) NAIL INTERTROCHANTRIC;  Surgeon: Lyndle Herrlich, MD;  Location: [facility];  Service: Orthopedics;  Laterality: Left;   KYPHOPLASTY N/A 08/20/2018   Procedure: [id number];  Surgeon: Kennedy Bucker, MD;  Location: [facility];  Service: Orthopedics;  Laterality: N/A;   LOWER EXTREMITY ANGIOGRAPHY Right 12/18/2019   Procedure: LOWER EXTREMITY ANGIOGRAPHY;  Surgeon: Annice Needy, MD;  Location: [facility];  Service: Cardiovascular;  Laterality: Right;   LOWER EXTREMITY  ANGIOGRAPHY Right 12/02/2020   Procedure: LOWER EXTREMITY ANGIOGRAPHY;  Surgeon: Annice Needy, MD;  Location: [facility];  Service: Cardiovascular;  Laterality: Right;   LOWER EXTREMITY ANGIOGRAPHY Right 12/23/2020   Procedure: Lower Extremity Angiography;  Surgeon: Annice Needy, MD;  Location: [facility];  Service: Cardiovascular;  Laterality: Right;   LOWER EXTREMITY ANGIOGRAPHY Right 12/24/2020   Procedure: Lower Extremity Angiography;  Surgeon: Annice Needy, MD;  Location: [facility];  Service: Cardiovascular;  Laterality: Right;   LOWER EXTREMITY ANGIOGRAPHY Right 10/20/2021   Procedure: Lower Extremity Angiography;  Surgeon: Annice Needy, MD;  Location: [facility];  Service: Cardiovascular;  Laterality: Right;   LOWER EXTREMITY ANGIOGRAPHY Right 07/03/2022   Procedure: Lower Extremity Angiography;  Surgeon: Annice Needy, MD;  Location: [facility];  Service: Cardiovascular;  Laterality: Right;   NECK SURGERY     NOSE SURGERY     ROTATOR CUFF REPAIR     x2  SHOULDER ARTHROSCOPY  12/07/2011   Procedure: ARTHROSCOPY SHOULDER;  Surgeon: Loreta Ave, MD;  Location: [facility];  Service: Orthopedics;  Laterality: Right;  Debridement Partial Cuff Tear, Release Coracoacromial Ligament   SHOULDER SURGERY  12/07/2011   right   Family History:  Family History  Problem Relation Age of Onset   Alcoholism Brother    Aneurysm Other    Family Psychiatric  History: Alcohol abuse and some family members Social History:  Social History   Substance and Sexual Activity  Alcohol Use No     Social History   Substance and Sexual Activity  Drug Use No    Social History   Socioeconomic History   Marital status: Divorced    Spouse name: Not on file   Number of children: Not on file   Years of education: Not on file   Highest education level: Not on file  Occupational History   Occupation: retired Arboriculturist  Tobacco Use    Smoking status: Some Days    Packs/day: 0    Types: Cigarettes   Smokeless tobacco: Never   Tobacco comments:    pt states 2 cigs per month   Substance and Sexual Activity   Alcohol use: No   Drug use: No   Sexual activity: Never  Other Topics Concern   Not on file  Social History Narrative   Not on file   Social Determinants of Health   Financial Resource Strain: Not on file  Food Insecurity: No Food Insecurity (05/07/2023)   Hunger Vital Sign    Worried About Running Out of Food in the Last Year: Never true    Ran Out of Food in the Last Year: Never true  Transportation Needs: No Transportation Needs (05/07/2023)   PRAPARE - Administrator, Civil Service (Medical): No    Lack of Transportation (Non-Medical): No  Physical Activity: Not on file  Stress: Not on file  Social Connections: Not on file   Additional Social History:    Allergies:  No Known Allergies  Labs: No results found for this or any previous visit (from the past 48 hour(s)).  Current Facility-Administered Medications  Medication Dose Route Frequency Provider Last Rate Last Admin   acetaminophen (TYLENOL) tablet 650 mg  650 mg Oral Q6H PRN Cox, Amy N, DO   650 mg at 05/08/23 2040   Or   acetaminophen (TYLENOL) suppository 650 mg  650 mg Rectal Q6H PRN Cox, Amy N, DO       amLODipine (NORVASC) tablet 5 mg  5 mg Oral Daily Cox, Amy N, DO   5 mg at 05/11/23 9528   ascorbic acid (VITAMIN C) tablet 500 mg  500 mg Oral Daily Cox, Amy N, DO   500 mg at 05/11/23 4132   atorvastatin (LIPITOR) tablet 10 mg  10 mg Oral QHS Cox, Amy N, DO   10 mg at 05/10/23 2156   benzonatate (TESSALON) capsule 100 mg  100 mg Oral TID PRN Cox, Amy N, DO       cyanocobalamin (VITAMIN B12) tablet 1,000 mcg  1,000 mcg Oral Daily Cox, Amy N, DO   1,000 mcg at 05/11/23 0806   enoxaparin (LOVENOX) injection 30 mg  30 mg Subcutaneous Q24H Coulter, Carolyn, RPH   30 mg at 05/10/23 2156   feeding supplement (BOOST / RESOURCE  BREEZE) liquid 1 Container  1 Container Oral TID BM Marcelino Duster, MD   1 Container at 05/09/23 2240  hydrOXYzine (ATARAX) tablet 25 mg  25 mg Oral TID PRN Cox, Amy N, DO   25 mg at 05/11/23 0806   iron polysaccharides (NIFEREX) capsule 150 mg  150 mg Oral Daily Cox, Amy N, DO   150 mg at 05/11/23 4098   levothyroxine (SYNTHROID) tablet 50 mcg  50 mcg Oral q morning Cox, Amy N, DO   50 mcg at 05/11/23 0600   melatonin tablet 5 mg  5 mg Oral QHS Cox, Amy N, DO   5 mg at 05/10/23 2157   metoprolol tartrate (LOPRESSOR) tablet 25 mg  25 mg Oral BID Cox, Amy N, DO   25 mg at 05/11/23 1191   mometasone-formoterol (DULERA) 200-5 MCG/ACT inhaler 2 puff  2 puff Inhalation BID Cox, Amy N, DO   2 puff at 05/11/23 0808   montelukast (SINGULAIR) tablet 10 mg  10 mg Oral QHS Cox, Amy N, DO   10 mg at 05/10/23 2156   multivitamin with minerals tablet 1 tablet  1 tablet Oral Daily Cox, Amy N, DO   1 tablet at 05/11/23 0806   ondansetron (ZOFRAN) tablet 4 mg  4 mg Oral Q6H PRN Cox, Amy N, DO       Or   ondansetron (ZOFRAN) injection 4 mg  4 mg Intravenous Q6H PRN Cox, Amy N, DO       oxyCODONE-acetaminophen (PERCOCET/ROXICET) 5-325 MG per tablet 2 tablet  2 tablet Oral Q6H PRN Leeroy Bock, MD   2 tablet at 05/11/23 1239   pantoprazole (PROTONIX) EC tablet 80 mg  80 mg Oral Daily Cox, Amy N, DO   80 mg at 05/11/23 4782   risperiDONE (RISPERDAL) tablet 0.25 mg  0.25 mg Oral QHS Cox, Amy N, DO   0.25 mg at 05/10/23 2156   thiamine (VITAMIN B1) tablet 100 mg  100 mg Oral Daily Cox, Amy N, DO   100 mg at 05/11/23 9562   traZODone (DESYREL) tablet 300 mg  300 mg Oral QHS Cox, Amy N, DO   300 mg at 05/10/23 2156    Musculoskeletal: Strength & Muscle Tone: decreased Gait & Station:  Did not assess Patient leans: N/A            Psychiatric Specialty Exam:  Presentation  General Appearance: No data recorded Eye Contact:No data recorded Speech:No data recorded Speech Volume:No data  recorded Handedness:No data recorded  Mood and Affect  Mood:No data recorded Affect:No data recorded  Thought Process  Thought Processes:No data recorded Descriptions of Associations:No data recorded Orientation:No data recorded Thought Content:No data recorded History of Schizophrenia/Schizoaffective disorder:No data recorded Duration of Psychotic Symptoms:No data recorded Hallucinations:No data recorded Ideas of Reference:No data recorded Suicidal Thoughts:No data recorded Homicidal Thoughts:No data recorded  Sensorium  Memory:No data recorded Judgment:No data recorded Insight:No data recorded  Executive Functions  Concentration:No data recorded Attention Span:No data recorded Recall:No data recorded Fund of Knowledge:No data recorded Language:No data recorded  Psychomotor Activity  Psychomotor Activity:No data recorded  Assets  Assets:No data recorded  Sleep  Sleep:No data recorded  Physical Exam: Physical Exam Vitals reviewed.  Constitutional:      Appearance: Normal appearance.  HENT:     Head: Normocephalic and atraumatic.     Mouth/Throat:     Pharynx: Oropharynx is clear.  Eyes:     Pupils: Pupils are equal, round, and reactive to light.  Cardiovascular:     Rate and Rhythm: Normal rate and regular rhythm.  Pulmonary:     Effort: Pulmonary  effort is normal.     Breath sounds: Normal breath sounds.     Comments: Patient is on oxygen and appears at times to be still making some effort with breathing.  Mentions a couple times that she feels short of breath Abdominal:     General: Abdomen is flat.     Palpations: Abdomen is soft.  Musculoskeletal:        General: Normal range of motion.  Skin:    General: Skin is warm and dry.  Neurological:     General: No focal deficit present.     Mental Status: She is alert. Mental status is at baseline.  Psychiatric:        Attention and Perception: Attention normal.        Mood and Affect: Mood is  anxious.        Speech: Speech is tangential.        Behavior: Behavior is cooperative.        Thought Content: Thought content normal. Thought content is not paranoid. Thought content does not include homicidal or suicidal ideation.        Cognition and Memory: Cognition is impaired. Memory is impaired.        Judgment: Judgment normal.    Review of Systems  Constitutional: Negative.   HENT: Negative.    Eyes: Negative.   Respiratory:  Positive for shortness of breath.   Cardiovascular: Negative.   Gastrointestinal: Negative.   Musculoskeletal: Negative.   Skin: Negative.   Neurological: Negative.   Psychiatric/Behavioral:  Negative for depression, hallucinations, memory loss, substance abuse and suicidal ideas. The patient is nervous/anxious. The patient does not have insomnia.    Blood pressure (!) 169/88, pulse 89, temperature 98.1 F (36.7 C), resp. rate 18, weight 40.4 kg, SpO2 100 %. Body mass index is 15.29 kg/m.  Treatment Plan Summary: Plan 77 year old woman with multiple medical problems especially COPD.  Altered mental status with confusion when she came into the hospital.  Treatment team has recommended she consider placement in assisted living.  Patient is aware of this but says that she would prefer not to because she does not want to give up her independence.  She is able to describe her house describe her way of doing basic ADLs.  She is aware that there is danger in going home.  Although she had some confusion about where she was at first overall she is not delirious.  She is not psychotic.  Patient almost certainly has some degree of dementia.  The degree of dementia however is not great enough that it takes away all of her ability to make decisions.  The baseline presumption is that people have the right to make their own decisions about where they live and when they are discharged and that the bar should be quite high about taking away that power.  In this circumstance I  do not think it meets that standard and so the patient still has capacity to make that decision.  I ask her if she would be willing to consider home health again at age he stated that she would additionally I noticed that the patient was placed on risk [place] when she came into the hospital.  Presumably for the acute delirium.  Probably no longer necessary.  Presumably when she goes home she will have continued as needed alprazolam.  I would recommend switching out those medicines now before she is discharged.  Disposition: No evidence of imminent risk to self or others at  present.   Supportive therapy provided about ongoing stressors.  Mordecai Rasmussen, MD 05/11/2023 4:02 PM

## 2023-05-11 NOTE — TOC Progression Note (Signed)
Transition of Care [facility]) - Progression Note    Patient Details  Name: [name] MRN: [id number] Date of Birth: 06-01-1946  Transition of Care [facility]) CM/SW Contact  Garret Reddish, RN Phone Number: 05/11/2023, 11:46 AM  Clinical Narrative:   I have followed up with [facility].  They are unable to transport patient between the hours of 8am to 9 am.  The earliest that EMS could transport patient today was between 9:30 am and 10 am.    I have informed patient's care giver of the above information and she informs me that only time she is able to be at the patient's home is 8 am to 9 am.    I have informed [name] on this information today.  [name] appears very confused today.  She is telling me that a Police office name Morrie Sheldon has been in her room most of the morning.  She also reports that she is at the [facility] office and that the [facility] can take her home today. She reports that the [facility] can do just about anything they want to do.    Their is an order for  Psyche evaluation.           Expected Discharge Plan and Services         Expected Discharge Date: 05/10/23                                     Social Determinants of Health (SDOH) Interventions SDOH Screenings   Food Insecurity: No Food Insecurity (05/07/2023)  Housing: Low Risk  (05/07/2023)  Transportation Needs: No Transportation Needs (05/07/2023)  Utilities: Not At Risk (05/07/2023)  Depression (PHQ2-9): Low Risk  (07/13/2021)  Tobacco Use: High Risk (05/06/2023)    Readmission Risk Interventions    01/31/2023   11:49 AM 07/05/2022   10:50 AM 11/25/2021    9:50 AM  Readmission Risk Prevention Plan  Transportation Screening Complete Complete Complete  Medication Review Oceanographer) Complete Complete Complete  PCP or Specialist appointment within 3-5 days of discharge Complete  Complete  HRI or Home Care Consult  Complete Complete  SW Recovery Care/Counseling Consult Complete  Complete   Palliative Care Screening Not Applicable  Not Applicable  Skilled Nursing Facility Not Applicable Not Applicable Not Applicable

## 2023-05-11 NOTE — Progress Notes (Signed)
Patient believed EMS transport would be taking her home today around 0900-1000. Around 1015 patient had called 911 asking for a deputy to give her a ride home. I explained to patient that she does not yet have discharge orders in, and I would send the case manager in to speak with her. Shortly, after I was called back into the room by patient and she asked "Where is Morrie Sheldon?" I asked who Morrie Sheldon was/ She said Morrie Sheldon was at the deputy that dropped her off her at the hospital.

## 2023-05-11 NOTE — Progress Notes (Signed)
PROGRESS NOTE  [name]    DOB: [birth year], 77 y.o.  VQQ:[id number]    Code Status: Full Code   DOA: 05/06/2023   LOS: 2   Brief hospital course  [name] is a 77 y.o. female with a PMH significant for anxiety, hypothyroid, advanced COPD, hyperlipidemia, hypertension.  They presented from home to the ED on 05/06/2023 with SOB x 2 days with increased phlegm production. She is a former tobacco user. At her peak she smoked 2 packs/day. She denies EtOH and recreational drug use. At baseline she is minimally ambulatory and lives alone but has daily caregivers.   In the ED, it was found that they had temperature 97.9, respiration rate of 19, heart rate 141, blood pressure 163/99, SpO2 96% on 2 L nasal cannula.  Significant findings included sodium is 143, potassium 4.0, chloride 107, bicarb 26, BUN of 8, serum creatinine of 0.53, eGFR greater than 60, nonfasting blood glucose 119, WBC 9.7, hemoglobin 12.3, platelets of 378.High sensitive troponin was 10.  Chest x-ray: Read as emphysematous changes without acute cardiopulmonary process.   They were initially treated with DuoNebs one-time treatment, Solu-Medrol 40 mg IV one-time dose, metoprolol 25 mg p.o. one-time dose, sodium chloride 1 L bolus.   Patient was admitted to medicine service for further workup and management of COPD exacerbation as outlined in detail below.  6/20-6/21 -stable, improved to her baseline respiratory status. Awaiting discharge due to unsafe dispo plan. Plan was to dc 6/21 with caregiver but there is question on that caregiver's reliability. Her grandson will be home tomorrow and will potentially be able to assist her at home. We are awaiting psych evaluation for capacity. 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 She continues to refuse placement in a facility with  fear of losing her independence which is reasonable thought. I question her capacity to understand the consequences of going home to an unsafe environment and her ability to take care of herself. She is not safe to live alone based on my evaluation.   Assessment & Plan  Principal Problem:   COPD exacerbation (HCC) Active Problems:   Chronic pain syndrome   CAD (coronary artery disease)   Essential hypertension   Underweight   HLD (hyperlipidemia)   Hypothyroidism   Anxiety and depression   Protein-calorie malnutrition, severe (HCC)   PAD (peripheral artery disease) (HCC)   Acute on chronic respiratory failure with hypoxemia (HCC)   COPD (chronic obstructive pulmonary disease) (HCC)  COPD exacerbation (HCC)- had increase in oxygen dependence from baseline 2.5L. Remains stable several days on home oxygen level. S/p Solu-Medrol 40 mg IV  Cough is improved and she feels at baseline.  - continue DuoNebs PRN - Maintain SpO2 greater than 88% -  azithromycin ( completed 6/19) - continue dulera BID - completed steroid pack - ambulate with pulse ox   CAD  HLD Atorvastatin 10 mg nightly resumed   Essential hypertension- moderately well controlled Resumed home amlodipine 5 mg daily, metoprolol tartrate 25 mg p.o. twice daily   Hypothyroidism Levothyroxine 50 mcg every morning resumed   Anxiety and depression Home trazodone 300 mg nightly resumed Home hydroxyzine 25 mg 3 times daily as needed for anxiety   Protein-calorie malnutrition, severe (HCC) Home Ensure between meals resumed  Body mass index is 15.29 kg/m.  VTE ppx: enoxaparin (  LOVENOX) injection 30 mg Start: 05/10/23 2200 Place TED hose Start: 05/06/23 1755  Diet:     Diet   Diet regular Fluid consistency: Thin   Consultants: None   Subjective 05/11/23    Pt reports feeling well. She is upset and embarrassed about her caregiver situation. Provided her with reassurance. She's worried that we are mad at her for  not being able to go home. She repeats several times that she has medicare so should be able to pay for her hospital stay. 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     Objective   Vitals:   05/09/23 2353 05/10/23 0728 05/10/23 1503 05/11/23 0056  BP: 115/64 135/74 123/76 (!) 149/73  Pulse: 73 85 90 84  Resp: 20 18 19 18   Temp: 98.5 F (36.9 C) 97.8 F (36.6 C) 98.4 F (36.9 C) (!) 97.4 F (36.3 C)  TempSrc:      SpO2: 98% 99% 100% 94%  Weight:        Intake/Output Summary (Last 24 hours) at 05/11/2023 0726 Last data filed at 05/11/2023 1610 Gross per 24 hour  Intake 240 ml  Output 800 ml  Net -560 ml    Filed Weights   05/07/23 0015  Weight: 40.4 kg     Physical Exam:  General: awake, alert, NAD. Frail HEENT: atraumatic, clear conjunctiva, anicteric sclera, MMM, hard of hearing Respiratory: normal respiratory effort. CTAB Cardiovascular: quick capillary refill Nervous: A&O x3. no gross focal neurologic deficits, normal speech Extremities: moves all equally, no edema, normal tone. S/p toe amputations  Skin: dry, intact, normal temperature, normal color. No rashes, lesions or ulcers on exposed skin Psychiatry: anxious mood, congruent affect. Mildly anxious. Flight of ideas  Labs   I have personally reviewed the following labs and imaging studies CBC    Component Value Date/Time   WBC 5.4 05/07/2023 0415   RBC 3.57 (L) 05/07/2023 0415   HGB 10.5 (L) 05/07/2023 0415   HCT 33.7 (L) 05/07/2023 0415   PLT 285 05/07/2023 0415   MCV 94.4 05/07/2023 0415   MCH 29.4 05/07/2023 0415   MCHC 31.2 05/07/2023 0415   RDW 14.4 05/07/2023 0415   LYMPHSABS 1.8 05/06/2023 1605   MONOABS 0.6 05/06/2023 1605   EOSABS 0.9 (H) 05/06/2023 1605   BASOSABS 0.1 05/06/2023 1605      Latest Ref Rng & Units 05/07/2023    4:15 AM 05/06/2023    4:05 PM 03/13/2023   12:52 PM  BMP  Glucose 70 - 99 mg/dL 960  454  97   BUN 8 - 23 mg/dL 10  8  17    Creatinine 0.44 - 1.00  mg/dL 0.98  1.19  1.47   Sodium 135 - 145 mmol/L 143  143  139   Potassium 3.5 - 5.1 mmol/L 4.0  4.0  3.7   Chloride 98 - 111 mmol/L 110  107  104   CO2 22 - 32 mmol/L 25  26  27    Calcium 8.9 - 10.3 mg/dL 8.3  8.8  8.5    No results found.  Disposition Plan & Communication  Patient status: Inpatient  Admitted From: Home Planned disposition location: Home Anticipated discharge date: 6/22 pending safe dispo plan. Has no caregiver available until 6/22. Medically ready for dc  Family Communication: none at bedside    Author: Leeroy Bock, DO [facility] 05/11/2023, 7:26 AM   Available by Epic secure chat 7AM-7PM. If 7PM-7AM, please contact  night-coverage.  [facility] contact information found on [email or web address].

## 2023-05-11 NOTE — Care Management Important Message (Signed)
Important Message  Patient Details  Name: [name] MRN: [id number] Date of Birth: [birth year]   Medicare Important Message Given:  Yes     [name] 05/11/2023, 2:55 PM

## 2023-05-12 DIAGNOSIS — J441 Chronic obstructive pulmonary disease with (acute) exacerbation: Secondary | ICD-10-CM | POA: Diagnosis not present

## 2023-05-12 NOTE — Progress Notes (Signed)
PT Cancellation Note  Patient Details Name: [name] MRN: [id number] DOB: Aug 10, 1946   Cancelled Treatment:     PT treatment not completed 2/2 to pt is disoriented x 4 and becomes agitated when asked to participate in PT interventions despite two attempts.  Pt denies of any pain. As per Nurse. Pt is mor confused 2/2 to pain meds and is waiting to be picked up by her care taker for discharge.  Tarita Deshmukh Jaci Standard 05/12/2023, 12:38 PM

## 2023-05-12 NOTE — Progress Notes (Signed)
PROGRESS NOTE  [name]    DOB: June 05, 1946, 76 y.o.  ZOX:[id number]    Code Status: Full Code   DOA: 05/06/2023   LOS: 3   Brief hospital course  [name] is a 77 y.o. female with a PMH significant for anxiety, hypothyroid, advanced COPD, hyperlipidemia, hypertension.  They presented from home to the ED on 05/06/2023 with SOB x 2 days with increased phlegm production. She is a former tobacco user. At her peak she smoked 2 packs/day. She denies EtOH and recreational drug use. At baseline she is minimally ambulatory and lives alone but has daily caregivers.   In the ED, it was found that they had temperature 97.9, respiration rate of 19, heart rate 141, blood pressure 163/99, SpO2 96% on 2 L nasal cannula.  Significant findings included sodium is 143, potassium 4.0, chloride 107, bicarb 26, BUN of 8, serum creatinine of 0.53, eGFR greater than 60, nonfasting blood glucose 119, WBC 9.7, hemoglobin 12.3, platelets of 378.High sensitive troponin was 10.  Chest x-ray: Read as emphysematous changes without acute cardiopulmonary process.   They were initially treated with DuoNebs one-time treatment, Solu-Medrol 40 mg IV one-time dose, metoprolol 25 mg p.o. one-time dose, sodium chloride 1 L bolus.   Patient was admitted to medicine service for further workup and management of COPD exacerbation as outlined in detail below.  6/20-6/22 -stable, improved to her baseline respiratory status.   6/22: patient was evaluated by psych yesterday and determined to have capacity. Continues to remain hospitalized for unsafe dispo plan. Grandson to come in tomorrow to discuss placement in ALF or other facility.   Assessment & Plan  Principal Problem:   COPD exacerbation (HCC) Active Problems:   Chronic pain syndrome   CAD (coronary artery disease)   Essential hypertension   Underweight   HLD (hyperlipidemia)   Hypothyroidism   Anxiety and depression   Protein-calorie malnutrition, severe (HCC)    PAD (peripheral artery disease) (HCC)   Acute on chronic respiratory failure with hypoxemia (HCC)   COPD (chronic obstructive pulmonary disease) (HCC)   Dementia with behavioral disturbance (HCC)  COPD exacerbation (HCC)- had increase in oxygen dependence from baseline 2.5L. Remains stable several days on home oxygen level. S/p Solu-Medrol 40 mg IV  Cough is improved and she feels at baseline.  - continue DuoNebs PRN - Maintain SpO2 greater than 88% -  azithromycin (completed 6/19) - continue dulera BID - completed steroid pack - ambulate with pulse ox   CAD  HLD Atorvastatin 10 mg nightly resumed   Essential hypertension- moderately well controlled Resumed home amlodipine 5 mg daily, metoprolol tartrate 25 mg p.o. twice daily   Hypothyroidism Levothyroxine 50 mcg every morning resumed   Anxiety and depression Home trazodone 300 mg nightly resumed Home hydroxyzine 25 mg 3 times daily as needed for anxiety   Protein-calorie malnutrition, severe (HCC) Home Ensure between meals resumed  Body mass index is 15.29 kg/m.  VTE ppx: enoxaparin (LOVENOX) injection 30 mg Start: 05/10/23 2200 Place TED hose Start: 05/06/23 1755  Diet:     Diet   Diet regular Fluid consistency: Thin   Consultants: None   Subjective 05/12/23    Pt reports feeling well. She is tired today. Denies respiratory symptoms.    Objective   Vitals:   05/11/23 0808 05/11/23 1730 05/11/23 2139 05/12/23 0032  BP: (!) 169/88 (!) 150/87 (!) 158/78 (!) 156/81  Pulse: 89 92 87 88  Resp: 18 17  18   Temp: 98.1  F (36.7 C) 98 F (36.7 C)  (!) 97.4 F (36.3 C)  TempSrc:      SpO2: 100% 90% 99% 100%  Weight:        Intake/Output Summary (Last 24 hours) at 05/12/2023 0717 Last data filed at 05/12/2023 0502 Gross per 24 hour  Intake 220 ml  Output 1150 ml  Net -930 ml    Filed Weights   05/07/23 0015  Weight: 40.4 kg     Physical Exam:  General: awake, alert, NAD. Frail HEENT: atraumatic,  clear conjunctiva, anicteric sclera, MMM, hard of hearing Respiratory: normal respiratory effort. CTAB Cardiovascular: quick capillary refill Nervous: A&O x3. no gross focal neurologic deficits, normal speech Extremities: moves all equally, no edema, normal tone. S/p toe amputations  Skin: dry, intact, normal temperature, normal color. No rashes, lesions or ulcers on exposed skin Psychiatry: anxious mood, congruent affect. Mildly anxious. Flight of ideas  Labs   I have personally reviewed the following labs and imaging studies CBC    Component Value Date/Time   WBC 5.4 05/07/2023 0415   RBC 3.57 (L) 05/07/2023 0415   HGB 10.5 (L) 05/07/2023 0415   HCT 33.7 (L) 05/07/2023 0415   PLT 285 05/07/2023 0415   MCV 94.4 05/07/2023 0415   MCH 29.4 05/07/2023 0415   MCHC 31.2 05/07/2023 0415   RDW 14.4 05/07/2023 0415   LYMPHSABS 1.8 05/06/2023 1605   MONOABS 0.6 05/06/2023 1605   EOSABS 0.9 (H) 05/06/2023 1605   BASOSABS 0.1 05/06/2023 1605      Latest Ref Rng & Units 05/07/2023    4:15 AM 05/06/2023    4:05 PM 03/13/2023   12:52 PM  BMP  Glucose 70 - 99 mg/dL 657  846  97   BUN 8 - 23 mg/dL 10  8  17    Creatinine 0.44 - 1.00 mg/dL 9.62  9.52  8.41   Sodium 135 - 145 mmol/L 143  143  139   Potassium 3.5 - 5.1 mmol/L 4.0  4.0  3.7   Chloride 98 - 111 mmol/L 110  107  104   CO2 22 - 32 mmol/L 25  26  27    Calcium 8.9 - 10.3 mg/dL 8.3  8.8  8.5     Disposition Plan & Communication  Patient status: Inpatient  Admitted From: Home Planned disposition location: Home vs facility TBD Anticipated discharge date: 6/23 pending safe dispo plan. Medically ready for dc  Family Communication: grandson on phone   Author: Leeroy Bock, DO [facility] 05/12/2023, 7:17 AM   Available by Epic secure chat 7AM-7PM. If 7PM-7AM, please contact night-coverage.  [facility] contact information found on [email or web address].

## 2023-05-13 DIAGNOSIS — J441 Chronic obstructive pulmonary disease with (acute) exacerbation: Secondary | ICD-10-CM | POA: Diagnosis not present

## 2023-05-13 LAB — CBC
HCT: 35.1 % — ABNORMAL LOW (ref 36.0–46.0)
Hemoglobin: 10.9 g/dL — ABNORMAL LOW (ref 12.0–15.0)
MCH: 28.8 pg (ref 26.0–34.0)
MCHC: 31.1 g/dL (ref 30.0–36.0)
MCV: 92.9 fL (ref 80.0–100.0)
Platelets: 254 10*3/uL (ref 150–400)
RBC: 3.78 MIL/uL — ABNORMAL LOW (ref 3.87–5.11)
RDW: 14 % (ref 11.5–15.5)
WBC: 7.5 10*3/uL (ref 4.0–10.5)
nRBC: 0 % (ref 0.0–0.2)

## 2023-05-13 LAB — BASIC METABOLIC PANEL
Anion gap: 10 (ref 5–15)
BUN: 15 mg/dL (ref 8–23)
CO2: 29 mmol/L (ref 22–32)
Calcium: 8.5 mg/dL — ABNORMAL LOW (ref 8.9–10.3)
Chloride: 100 mmol/L (ref 98–111)
Creatinine, Ser: 0.63 mg/dL (ref 0.44–1.00)
GFR, Estimated: >60 mL/min (ref >60)
Glucose, Bld: 62 mg/dL — ABNORMAL LOW (ref 70–99)
Potassium: 3.2 mmol/L — ABNORMAL LOW (ref 3.5–5.1)
Sodium: 139 mmol/L (ref 135–145)

## 2023-05-13 MED ORDER — HYDROCODONE-ACETAMINOPHEN 5-325 MG PO TABS
1.0000 | ORAL_TABLET | Freq: Three times a day (TID) | ORAL | Status: AC
  Administered 2023-05-13 – 2023-05-14 (×5): 1 via ORAL
  Filled 2023-05-13 (×5): qty 1

## 2023-05-13 MED ORDER — POTASSIUM CHLORIDE 20 MEQ PO PACK
40.0000 meq | PACK | Freq: Two times a day (BID) | ORAL | Status: AC
  Administered 2023-05-13 (×2): 40 meq via ORAL
  Filled 2023-05-13 (×2): qty 2

## 2023-05-13 MED ORDER — ORAL CARE MOUTH RINSE: 15.0000 mL | OROMUCOSAL | Status: DC | PRN

## 2023-05-13 NOTE — Progress Notes (Signed)
PROGRESS NOTE  [name]    DOB: [birth year], 77 y.o.  GEX:[id number]    Code Status: Full Code   DOA: 05/06/2023   LOS: 4   Brief hospital course  [name] is a 77 y.o. female with a PMH significant for anxiety, hypothyroid, advanced COPD, hyperlipidemia, hypertension.  They presented from home to the ED on 05/06/2023 with SOB x 2 days with increased phlegm production. She is a former tobacco user. At her peak she smoked 2 packs/day. She denies EtOH and recreational drug use. At baseline she is minimally ambulatory and lives alone but has daily caregivers.   In the ED, it was found that they had temperature 97.9, respiration rate of 19, heart rate 141, blood pressure 163/99, SpO2 96% on 2 L nasal cannula.  Significant findings included sodium is 143, potassium 4.0, chloride 107, bicarb 26, BUN of 8, serum creatinine of 0.53, eGFR greater than 60, nonfasting blood glucose 119, WBC 9.7, hemoglobin 12.3, platelets of 378.High sensitive troponin was 10.  Chest x-ray: Read as emphysematous changes without acute cardiopulmonary process.   They were initially treated with DuoNebs one-time treatment, Solu-Medrol 40 mg IV one-time dose, metoprolol 25 mg p.o. one-time dose, sodium chloride 1 L bolus.   Patient was admitted to medicine service for further workup and management of COPD exacerbation as outlined in detail below.  6/20-6/22 -stable, improved to her baseline respiratory status.   6/22: patient was evaluated by psych yesterday and determined to have capacity. Continues to remain hospitalized for unsafe dispo plan. Grandson to come in tomorrow to discuss placement in ALF or other facility.   6/23: patient evaluated by PT who recommends SNF. Patient is agreeable to SNF as long as it isn't [facility]. Her grandson visited today and is in agreement with the plan as well and working on care for after SNF as well. TOC engaged for placement. Medically ready for dc when SNF placement available.    Assessment & Plan  Principal Problem:   COPD exacerbation (HCC) Active Problems:   Chronic pain syndrome   CAD (coronary artery disease)   Essential hypertension   Underweight   HLD (hyperlipidemia)   Hypothyroidism   Anxiety and depression   Protein-calorie malnutrition, severe (HCC)   PAD (peripheral artery disease) (HCC)   Acute on chronic respiratory failure with hypoxemia (HCC)   COPD (chronic obstructive pulmonary disease) (HCC)   Dementia with behavioral disturbance (HCC)  COPD exacerbation (HCC)- had increase in oxygen dependence from baseline 2.5L. Remains stable several days on home oxygen level. S/p Solu-Medrol 40 mg IV  Cough is improved and she feels at baseline.  - continue DuoNebs PRN - Maintain SpO2 greater than 88% -  azithromycin (completed 6/19) - continue dulera BID - completed steroid pack - ambulate with pulse ox   CAD  HLD Atorvastatin 10 mg nightly resumed   Essential hypertension- moderately well controlled Resumed home amlodipine 5 mg daily, metoprolol tartrate 25 mg p.o. twice daily   Hypothyroidism Levothyroxine 50 mcg every morning resumed   Anxiety and depression Home trazodone 300 mg nightly resumed Home hydroxyzine 25 mg 3 times daily as needed for anxiety   Protein-calorie malnutrition, severe (HCC) Home Ensure between meals resumed  Body mass index is 15.29 kg/m.  VTE ppx: enoxaparin (LOVENOX) injection 30 mg Start: 05/10/23 2200 Place TED hose Start: 05/06/23 1755  Diet:     Diet   Diet regular Fluid consistency: Thin   Consultants: None   Subjective 05/13/23  Pt reports feeling well. States that she is agreeable to working with PT today after she gets her pain medications. She denies respiratory complaints. After discussing specifics about what happens at SNF, she is agreeable to being discharged to SNF.    Objective   Vitals:   05/12/23 0737 05/12/23 1521 05/12/23 2107 05/12/23 2346  BP: 119/81 (!) 149/74  127/70 116/68  Pulse: 77 86 92 88  Resp: 16 17  18   Temp: 97.9 F (36.6 C) 98.3 F (36.8 C)  97.7 F (36.5 C)  TempSrc:      SpO2: 100% 100%  100%  Weight:        Intake/Output Summary (Last 24 hours) at 05/13/2023 0718 Last data filed at 05/13/2023 0646 Gross per 24 hour  Intake 360 ml  Output 500 ml  Net -140 ml    Filed Weights   05/07/23 0015  Weight: 40.4 kg     Physical Exam:  General: awake, alert, NAD. Frail HEENT: atraumatic, clear conjunctiva, anicteric sclera, MMM, hard of hearing Respiratory: normal respiratory effort. CTAB Cardiovascular: quick capillary refill Nervous: A&O x3. no gross focal neurologic deficits, normal speech Extremities: moves all equally, no edema, normal tone. S/p toe amputations  Skin: dry, intact, normal temperature, normal color. No rashes, lesions or ulcers on exposed skin Psychiatry: congruent affect. Mildly anxious.   Labs   I have personally reviewed the following labs and imaging studies CBC    Component Value Date/Time   WBC 7.5 05/13/2023 0416   RBC 3.78 (L) 05/13/2023 0416   HGB 10.9 (L) 05/13/2023 0416   HCT 35.1 (L) 05/13/2023 0416   PLT 254 05/13/2023 0416   MCV 92.9 05/13/2023 0416   MCH 28.8 05/13/2023 0416   MCHC 31.1 05/13/2023 0416   RDW 14.0 05/13/2023 0416   LYMPHSABS 1.8 05/06/2023 1605   MONOABS 0.6 05/06/2023 1605   EOSABS 0.9 (H) 05/06/2023 1605   BASOSABS 0.1 05/06/2023 1605      Latest Ref Rng & Units 05/13/2023    4:16 AM 05/07/2023    4:15 AM 05/06/2023    4:05 PM  BMP  Glucose 70 - 99 mg/dL 62  161  096   BUN 8 - 23 mg/dL 15  10  8    Creatinine 0.44 - 1.00 mg/dL 0.45  4.09  8.11   Sodium 135 - 145 mmol/L 139  143  143   Potassium 3.5 - 5.1 mmol/L 3.2  4.0  4.0   Chloride 98 - 111 mmol/L 100  110  107   CO2 22 - 32 mmol/L 29  25  26    Calcium 8.9 - 10.3 mg/dL 8.5  8.3  8.8     Disposition Plan & Communication  Patient status: Inpatient  Admitted From: Home Planned disposition location:  SNF Anticipated discharge date: 6/25 pending safe dispo plan. Medically ready for dc  Family Communication: grandson on phone   Author: Leeroy Bock, DO [facility] 05/13/2023, 7:18 AM   Available by Epic secure chat 7AM-7PM. If 7PM-7AM, please contact night-coverage.  [facility] contact information found on [email or web address].

## 2023-05-13 NOTE — Progress Notes (Signed)
Physical Therapy Treatment Patient Details Name: [name] MRN: [id number] DOB: Jul 28, 1946 Today's Date: 05/13/2023   History of Present Illness [name] is a 77 y.o. female with medical history significant of COPD (on 2-3L oxygen), hypertension, hyperlipidemia, PVD, CAD, hypothyroidism, GERD, depression with anxiety, anemia, former smoker, medication noncompliance, who presents with shortness of breath.    PT Comments    After several earlier attempts patient agreed to PT visit. She presented as limited overall- only transfer with minimal steps and ongoing LE muscle weakness. She required AAROM some basic supine LE strengthening exercises and initially reported pain upon arrival yet no verbal mention of pain during exercises or transfer/walking to chair. She will continue to benefit from acute PT services to assess with her strengthening and improve her functional mobility for improved quality of life and decreased risk of falling.    Recommendations for follow up therapy are one component of a multi-disciplinary discharge planning process, led by the attending physician.  Recommendations may be updated based on patient status, additional functional criteria and insurance authorization.  Follow Up Recommendations       Assistance Recommended at Discharge Intermittent Supervision/Assistance  Patient can return home with the following A little help with walking and/or transfers;A little help with bathing/dressing/bathroom;Assistance with cooking/housework;Direct supervision/assist for medications management;Assist for transportation;Help with stairs or ramp for entrance   Equipment Recommendations  None recommended by PT    Recommendations for Other Services OT consult     Precautions / Restrictions Precautions Precautions: Fall Precaution Comments: O2 Restrictions Weight Bearing Restrictions: No     Mobility  Bed Mobility Overal bed mobility: Modified Independent              General bed mobility comments: Patient able to rolling, supine <> sit, and scoot HOB independently. Patient Response: Anxious  Transfers Overall transfer level: Needs assistance Equipment used: Rolling walker (2 wheels) Transfers: Sit to/from Stand Sit to Stand: Min guard           General transfer comment: Patient able to stand with min guard + RW. Transferred to chair taking several short steps with no verbal report of pain.    Ambulation/Gait Ambulation/Gait assistance: Min guard Gait Distance (Feet): 3 Feet Assistive device: Rolling walker (2 wheels) Gait Pattern/deviations: Step-through pattern Gait velocity: decreased     General Gait Details: Pt was able to ambulate ~ 3 from bed to chair in room. No verbal complaint of pain using RW with min guard and 3L 02. O2 sat after = 94%   Stairs             Wheelchair Mobility    Modified Rankin (Stroke Patients Only)       Balance Overall balance assessment: Needs assistance Sitting-balance support: Feet supported Sitting balance-Leahy Scale: Good     Standing balance support: Bilateral upper extremity supported, During functional activity, Reliant on assistive device for balance Standing balance-Leahy Scale: Good Standing balance comment: Min guard due to general weakness                            Cognition Arousal/Alertness: Awake/alert Behavior During Therapy: WFL for tasks assessed/performed Overall Cognitive Status: Within Functional Limits for tasks assessed                                 General Comments: Pt is HOH- coversational  Exercises General Exercises - Lower Extremity Ankle Circles/Pumps: AROM, 15 reps, Both, Supine Heel Slides: AAROM, Both, 15 reps, Supine Hip ABduction/ADduction: AAROM, Both, 15 reps    General Comments        Pertinent Vitals/Pain Pain Assessment Pain Assessment: 0-10 Pain Score: 6  Breathing: normal Negative  Vocalization: none Facial Expression: smiling or inexpressive Body Language: relaxed Consolability: no need to console PAINAD Score: 0 Facial Expression: Relaxed, neutral Pain Location: Right foot Pain Descriptors / Indicators: Aching Pain Intervention(s): Limited activity within patient's tolerance, Monitored during session, Premedicated before session, Repositioned, Other (comment) (RN provided Pain meds prior to PT visit)    Home Living Family/patient expects to be discharged to:: Private residence Living Arrangements: Alone Available Help at Discharge: Personal care attendant;Available PRN/intermittently Type of Home: House Home Access: Stairs to enter Entrance Stairs-Rails: Left Entrance Stairs-Number of Steps: 4   Home Layout: One level Home Equipment: Agricultural consultant (2 wheels);Rollator (4 wheels);Shower seat;Cane - single point Additional Comments: Patient confirmed information from prior documentation.    Prior Function            PT Goals (current goals can now be found in the care plan section)      Frequency    Min 2X/week      PT Plan      Co-evaluation              AM-PAC PT "6 Clicks" Mobility   Outcome Measure  Help needed turning from your back to your side while in a flat bed without using bedrails?: None Help needed moving from lying on your back to sitting on the side of a flat bed without using bedrails?: None Help needed moving to and from a bed to a chair (including a wheelchair)?: A Little Help needed standing up from a chair using your arms (e.g., wheelchair or bedside chair)?: A Little Help needed to walk in hospital room?: A Little Help needed climbing 3-5 steps with a railing? : A Lot 6 Click Score: 19    End of Session Equipment Utilized During Treatment: Oxygen Activity Tolerance: Patient limited by fatigue;Patient limited by pain Patient left: in chair;with call bell/phone within reach;with chair alarm set Nurse  Communication: Mobility status;Other (comment) (Nursing to change bed and purewick) PT Visit Diagnosis: Unsteadiness on feet (R26.81);Other abnormalities of gait and mobility (R26.89);History of falling (Z91.81);Difficulty in walking, not elsewhere classified (R26.2);Dizziness and giddiness (R42)     Time: 4098-1191 PT Time Calculation (min) (ACUTE ONLY): 47 min  Charges:  $Gait Training: 8-22 mins $Therapeutic Activity: 8-22 mins                        Louis Meckel, PT 05/13/2023, 12:24 PM

## 2023-05-13 NOTE — Evaluation (Signed)
Occupational Therapy Evaluation Patient Details Name: [name] MRN: [id number] DOB: Sep 09, 1946 Today's Date: 05/13/2023   History of Present Illness [name] is a 77 y.o. female with medical history significant of COPD (on 2-3L oxygen), hypertension, hyperlipidemia, PVD, CAD, hypothyroidism, GERD, depression with anxiety, anemia, former smoker, medication noncompliance, who presents with shortness of breath.   Clinical Impression   Patient received for OT evaluation. See flowsheet below for details of function. Generally, patient MOD (I) for bed mobility, CGA with RW for functional transfer today, and set up-MOD A for ADLs. Patient will benefit from continued OT while in acute care. Pt is motivated to participate, asking OT when she is returning.      Recommendations for follow up therapy are one component of a multi-disciplinary discharge planning process, led by the attending physician.  Recommendations may be updated based on patient status, additional functional criteria and insurance authorization.   Assistance Recommended at Discharge Frequent or constant Supervision/Assistance  Patient can return home with the following A little help with walking and/or transfers;A lot of help with bathing/dressing/bathroom;Assistance with cooking/housework;Direct supervision/assist for medications management;Direct supervision/assist for financial management;Assist for transportation;Help with stairs or ramp for entrance    Functional Status Assessment  Patient has had a recent decline in their functional status and demonstrates the ability to make significant improvements in function in a reasonable and predictable amount of time.  Equipment Recommendations  Other (comment) (defer)    Recommendations for Other Services       Precautions / Restrictions Precautions Precautions: Fall Precaution Comments: O2 Restrictions Weight Bearing Restrictions: No      Mobility Bed  Mobility Overal bed mobility: Modified Independent             General bed mobility comments: able to t/f from EOB to supine without assist.    Transfers Overall transfer level: Needs assistance Equipment used: Rolling walker (2 wheels) Transfers: Sit to/from Stand Sit to Stand: Min guard                  Balance Overall balance assessment: Needs assistance Sitting-balance support: Feet supported Sitting balance-Leahy Scale: Good     Standing balance support: Bilateral upper extremity supported, During functional activity, Reliant on assistive device for balance Standing balance-Leahy Scale: Good                             ADL either performed or assessed with clinical judgement   ADL Overall ADL's : Needs assistance/impaired Eating/Feeding: Set up   Grooming: Set up;Oral care;Wash/dry face;Sitting     Upper Body Bathing Details (indicate cue type and reason): not tested; anticipate set up from seated   Lower Body Bathing Details (indicate cue type and reason): anticipate MIN A from seated; pt expresses a lot of fear about falling   Upper Body Dressing Details (indicate cue type and reason): set up; pt able to don new gown   Lower Body Dressing Details (indicate cue type and reason): not tested; anticipate MOD A 2/2 pt's fear of falling.   Toilet Transfer Details (indicate cue type and reason): not tested; pt using purewick. Anticipate CGA with RW to [facility]   Toileting - Clothing Manipulation Details (indicate cue type and reason): anticipate CGA in standing at RW.   Tub/Shower Transfer Details (indicate cue type and reason): not tested; pt states she is afraid of falling and refuses showering often at home, even with caregiver assistance. Functional mobility  during ADLs: Min guard;Rolling walker (2 wheels) General ADL Comments: Pt agreeable to t/f from chair to bed; has reached PT's goal from earlier today of staying up in chair until 3pm. Pt not  wanting to do further mobility today.     Vision Baseline Vision/History: 1 Wears glasses       Perception     Praxis      Pertinent Vitals/Pain Pain Assessment Pain Assessment: 0-10 Pain Score:  (unrated) Pain Location: sacral wounds Pain Descriptors / Indicators: Discomfort Pain Intervention(s): Limited activity within patient's tolerance, Monitored during session     Hand Dominance Right   Extremity/Trunk Assessment Upper Extremity Assessment Upper Extremity Assessment: Generalized weakness (Pt c/o hand grip weakness; difficulty fully pushing inhaler.)   Lower Extremity Assessment Lower Extremity Assessment: Generalized weakness       Communication Communication Communication: HOH   Cognition Arousal/Alertness: Awake/alert Behavior During Therapy: WFL for tasks assessed/performed Overall Cognitive Status: Within Functional Limits for tasks assessed                                 General Comments: Very HOH, but able to hear at high volume. Pt needing several cues about the correct people to ask about discharge needs (pt asking about who can assist with finding a facility, financial issues, etc). Pt did have good memory from 90 minutes earlier when OT introduced self and told pt about childhood dog, then remembered the name when OT returned later for session. Pt mildly tangential; easily redirectable.     General Comments  Pt used inhaler twice during session; pt on 3L O2 throughout session.    Exercises     Shoulder Instructions      Home Living Family/patient expects to be discharged to:: Private residence Living Arrangements: Alone Available Help at Discharge: Personal care attendant;Available PRN/intermittently Type of Home: House Home Access: Stairs to enter [employer] of Steps: 4 Entrance Stairs-Rails: Left Home Layout: One level     Bathroom Shower/Tub: Walk-in shower;Tub/shower unit   Bathroom Toilet: Standard Bathroom  Accessibility: Yes   Home Equipment: Agricultural consultant (2 wheels);Rollator (4 wheels);Shower seat;Cane - single point   Additional Comments: Case management notes from hospitalization state that caregiver is unreliable and pt's d/c home is deemed not safe at this time.      Prior Functioning/Environment Prior Level of Function : Needs assist  Cognitive Assist : ADLs (cognitive)   ADLs (Cognitive): Intermittent cues Physical Assist : ADLs (physical);Mobility (physical) Mobility (physical): Stairs ADLs (physical): Bathing;Grooming;Dressing;IADLs Mobility Comments: ambualtes with rollator at home, has a caretaker who takes care of the household and assists her with bathing. ADLs Comments: Aid assists with ADLs/IADLs. patient states she needs assist because she is very afraid of falling.        OT Problem List:        OT Treatment/Interventions: Therapeutic exercise;Self-care/ADL training;DME and/or AE instruction;Patient/family education;Therapeutic activities    OT Goals(Current goals can be found in the care plan section) Acute Rehab OT Goals Patient Stated Goal: Get better; be safe and not fall OT Goal Formulation: With patient Time For Goal Achievement: 05/27/23 Potential to Achieve Goals: Good ADL Goals Pt Will Perform Grooming: with modified independence;standing Pt Will Perform Lower Body Dressing: sit to/from stand;with modified independence;with adaptive equipment Pt Will Transfer to Toilet: with modified independence;bedside commode;ambulating Pt Will Perform Toileting - Clothing Manipulation and hygiene: with modified independence;sit to/from stand Pt Will Perform Tub/Shower Transfer:  tub bench;Tub transfer;with supervision  OT Frequency: Min 2X/week    Co-evaluation              AM-PAC OT "6 Clicks" Daily Activity     Outcome Measure Help from another person eating meals?: None Help from another person taking care of personal grooming?: A Little Help from  another person toileting, which includes using toliet, bedpan, or urinal?: A Little Help from another person bathing (including washing, rinsing, drying)?: A Lot Help from another person to put on and taking off regular upper body clothing?: None Help from another person to put on and taking off regular lower body clothing?: A Little 6 Click Score: 19   End of Session Equipment Utilized During Treatment: Rolling walker (2 wheels) Nurse Communication: Mobility status;Other (comment) (pt reporting need for new wound dressing)  Activity Tolerance: Patient tolerated treatment well Patient left: in bed;with call bell/phone within reach;with bed alarm set                   Time: 1610-9604 OT Time Calculation (min): 42 min Charges:  OT General Charges $OT Visit: 1 Visit OT Evaluation $OT Eval Moderate Complexity: 1 Mod OT Treatments $Self Care/Home Management : 8-22 mins $Therapeutic Activity: 8-22 mins  Linward Foster, MS, OTR/L  Alvester Morin 05/13/2023, 4:10 PM

## 2023-05-13 NOTE — [place] FL2 (Signed)
[place] MEDICAID FL2 LEVEL OF CARE FORM     IDENTIFICATION  Patient Name: [name] Birthdate: Sep 05, 1946 Sex: female Admission Date (Current Location): 05/06/2023  [place] and [place] Number:  Chiropodist and Address:  [facility], [address], [place], [place] [address]      Provider Number: [id number]  Attending Physician Name and Address:  Leeroy Bock, MD  Relative Name and Phone Number:  [place]ian[place], Band)  [phone]    Current Level of Care: Hospital Recommended Level of Care: Skilled Nursing Facility Prior Approval Number:    Date Approved/Denied:   PASRR Number: [id number]  Discharge Plan: SNF    Current Diagnoses: Patient Active Problem List   Diagnosis Date Noted   Dementia with behavioral disturbance (HCC) 05/11/2023   COPD (chronic obstructive pulmonary disease) (HCC) 05/09/2023   Acute delirium 02/25/2023   Myocardial injury 02/24/2023   Acute metabolic encephalopathy 02/24/2023   Sinus tachycardia 02/24/2023   CAD (coronary artery disease) 01/30/2023   Normocytic anemia 01/30/2023   Compression fracture of body of thoracic vertebra (HCC) 01/30/2023   Acute on chronic respiratory failure with hypoxemia (HCC) 01/11/2023   AMS (altered mental status) 11/08/2022   Underweight 11/08/2022   Acute on chronic respiratory failure with hypoxia (HCC) 06/30/2022   Osteomyelitis of fifth toe of right foot (HCC) 06/30/2022   Dyslipidemia 06/30/2022   Subacute osteomyelitis of right foot (HCC)    Essential hypertension    Protein-calorie malnutrition, severe (HCC) 11/21/2021   COPD with acute exacerbation (HCC) 11/21/2021   COPD exacerbation (HCC) 11/20/2021   Chronic pain - multiple sites arthritis 11/20/2021   Benzodiazepine dependence, continuous (HCC) 11/20/2021   Peripheral vascular disease (HCC) 11/20/2021   Hyponatremia 11/20/2021   Hypocalcemia 11/20/2021   Hypoalbuminemia due to  protein-calorie malnutrition (HCC) 11/20/2021   Anemia 11/20/2021   Acute respiratory failure with hypoxemia (HCC)    Benzodiazepine withdrawal without complication (HCC)    Malnutrition of moderate degree 10/20/2021   Absent pedal pulses 10/19/2021   Chronic hip pain (Left) 02/17/2021   Hip pain, acute (Left) 02/17/2021   Chronic use of opiate for therapeutic purpose 02/16/2021   Closed fracture of hip, sequela (Left) 02/07/2021   Fracture of femoral neck, left, closed (HCC) 01/11/2021   Chronic respiratory failure with hypoxia (HCC) 01/11/2021   Hypokalemia 01/11/2021   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 (HCC) 12/22/2020   PAD (peripheral artery disease) (HCC) 12/22/2020   Encounter for long-term opiate analgesic use 11/11/2020   Dry gangrene (HCC) of middle toe, right foot 11/11/2020   Foot pain, right 11/11/2020   Hard of hearing 11/11/2020   Atherosclerosis of native arteries of the extremities with gangrene (HCC) 12/09/2019   Vitamin D deficiency 11/12/2019   Pharmacologic therapy 06/04/2019   Disorder of skeletal system 06/04/2019   Problems influencing health status 06/04/2019   Compression fracture of L3 vertebra (HCC) 08/19/2018   Chronic hip pain (Bilateral) 12/31/2017   Neurogenic pain 08/29/2017   Chronic low back pain (1ry area of Pain) (Right) w/o sciatica 08/29/2017   Chronic sacroiliac joint pain (Right) 06/05/2017   Vitamin D insufficiency 03/12/2017   Right hip pain 02/20/2017   Intertrochanteric fracture of right hip, sequela 02/20/2017   B12 deficiency 02/05/2017   Pressure injury of skin 10/02/2016   Closed displaced intertrochanteric fracture of right femur (HCC) 10/02/2016   Hip fracture (HCC) 10/01/2016   Cervical facet hypertrophy (Bilateral) 05/27/2016   History of shoulder surgery 5 (Right)  05/27/2016   Lumbar foraminal stenosis (L3-4) (Left) 05/27/2016   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) 05/27/2016   Lumbar facet hypertrophy  (Bilateral) 05/27/2016   Lumbar facet syndrome (Right) 05/27/2016   Lumbar grade 1 Anterolisthesis of L3 over L4 05/27/2016   Chronic shoulder pain (Bilateral) (status post multiple surgeries) (R>L) 12/08/2015   Substance use disorder Risk: High 09/14/2015   Chronic pain syndrome 09/14/2015   Cervical spondylosis 09/14/2015   Chronic neck pain (2ry area of Pain) (Bilateral) (R>L) 09/14/2015   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) 09/14/2015   Cervical facet syndrome (Bilateral) (R>L) 09/14/2015   Cervical myofascial pain syndrome 09/14/2015   Lumbar spondylosis 09/14/2015   Chronic shoulder impingement syndrome (Right) 09/14/2015   Hypomagnesemia 09/14/2015   CRP elevated 09/14/2015   Elevated sedimentation rate 09/14/2015   Chronic obstructive pulmonary disease (COPD) (HCC) 09/14/2015   Nicotine dependence 09/14/2015   Chronic shoulder pain (Right) 09/14/2015   Abnormal nerve conduction studies 09/14/2015   Encounter for therapeutic drug level monitoring 09/09/2015   Long term current use of opiate analgesic 09/09/2015   Long term prescription opiate use 09/09/2015   Uncomplicated opioid dependence (HCC) 09/09/2015   Opiate use 09/09/2015   Anxiety and depression 07/11/2012   Tobacco use disorder 05/27/2011   Fatigue 05/27/2011   Hypothyroidism 11/25/2010   HLD (hyperlipidemia) 08/24/2010   Tachycardia 08/24/2010   DYSPNEA 08/24/2010    Orientation RESPIRATION BLADDER Height & Weight     Self, Time, Place  O2 (2-3L) Incontinent Weight: 89 lb 1.1 oz (40.4 kg) Height:     BEHAVIORAL SYMPTOMS/MOOD NEUROLOGICAL BOWEL NUTRITION STATUS      Incontinent Diet (See discharge summary)  AMBULATORY STATUS COMMUNICATION OF NEEDS Skin   Limited Assist Verbally Normal                       Personal Care Assistance Level of Assistance  Bathing, Feeding, Dressing Bathing Assistance: Limited assistance Feeding assistance: Limited  assistance Dressing Assistance: Limited assistance     Functional Limitations Info  Sight, Hearing, Speech Sight Info: Adequate Hearing Info: Adequate Speech Info: Adequate    SPECIAL CARE FACTORS FREQUENCY  PT (By licensed PT)     PT Frequency: MIN 2X/week              Contractures Contractures Info: Not present    Additional Factors Info  Code Status, Allergies Code Status Info: Full Allergies Info: No known allergies listed           Current Medications (05/13/2023):  This is the current hospital active medication list Current Facility-Administered Medications  Medication Dose Route Frequency Provider Last Rate Last Admin   amLODipine (NORVASC) tablet 5 mg  5 mg Oral Daily Cox, Amy N, DO   5 mg at 05/13/23 0960   ascorbic acid (VITAMIN C) tablet 500 mg  500 mg Oral Daily Cox, Amy N, DO   500 mg at 05/13/23 4540   atorvastatin (LIPITOR) tablet 10 mg  10 mg Oral QHS Cox, Amy N, DO   10 mg at 05/12/23 2106   benzonatate (TESSALON) capsule 100 mg  100 mg Oral TID PRN Cox, Amy N, DO       cyanocobalamin (VITAMIN B12) tablet 1,000 mcg  1,000 mcg Oral Daily Cox, Amy N, DO   1,000 mcg at 05/13/23 0925   enoxaparin (LOVENOX) injection 30 mg  30 mg Subcutaneous Q24H Coulter, Carolyn, RPH   30 mg at 05/12/23  2108   feeding supplement (BOOST / RESOURCE BREEZE) liquid 1 Container  1 Container Oral TID BM Marcelino Duster, MD   1 Container at 05/13/23 0925   HYDROcodone-acetaminophen (NORCO/VICODIN) 5-325 MG per tablet 1 tablet  1 tablet Oral Q8H Leeroy Bock, MD   1 tablet at 05/13/23 1339   hydrOXYzine (ATARAX) tablet 25 mg  25 mg Oral TID PRN Cox, Amy N, DO   25 mg at 05/13/23 0932   iron polysaccharides (NIFEREX) capsule 150 mg  150 mg Oral Daily Cox, Amy N, DO   150 mg at 05/13/23 2130   levothyroxine (SYNTHROID) tablet 50 mcg  50 mcg Oral q morning Cox, Amy N, DO   50 mcg at 05/13/23 0510   melatonin tablet 5 mg  5 mg Oral QHS Cox, Amy N, DO   5 mg at 05/12/23 2106    metoprolol tartrate (LOPRESSOR) tablet 25 mg  25 mg Oral BID Cox, Amy N, DO   25 mg at 05/13/23 8657   mometasone-formoterol (DULERA) 200-5 MCG/ACT inhaler 2 puff  2 puff Inhalation BID Cox, Amy N, DO   2 puff at 05/13/23 0831   montelukast (SINGULAIR) tablet 10 mg  10 mg Oral QHS Cox, Amy N, DO   10 mg at 05/12/23 2106   multivitamin with minerals tablet 1 tablet  1 tablet Oral Daily Cox, Amy N, DO   1 tablet at 05/13/23 8469   Oral care mouth rinse  15 mL Mouth Rinse PRN Leeroy Bock, MD       pantoprazole (PROTONIX) EC tablet 80 mg  80 mg Oral Daily Cox, Amy N, DO   80 mg at 05/13/23 6295   potassium chloride (KLOR-CON) packet 40 mEq  40 mEq Oral BID Leeroy Bock, MD       risperiDONE (RISPERDAL) tablet 0.25 mg  0.25 mg Oral QHS Cox, Amy N, DO   0.25 mg at 05/12/23 2107   thiamine (VITAMIN B1) tablet 100 mg  100 mg Oral Daily Cox, Amy N, DO   100 mg at 05/13/23 2841   traZODone (DESYREL) tablet 300 mg  300 mg Oral QHS Cox, Amy N, DO   300 mg at 05/12/23 2106     Discharge Medications: Please see discharge summary for a list of discharge medications.  Relevant Imaging Results:  Relevant Lab Results:   Additional Information SS: [id number]  Susa Simmonds, LCSWA

## 2023-05-14 DIAGNOSIS — J441 Chronic obstructive pulmonary disease with (acute) exacerbation: Secondary | ICD-10-CM | POA: Diagnosis not present

## 2023-05-14 LAB — CBC
HCT: 36 % (ref 36.0–46.0)
Hemoglobin: 11.1 g/dL — ABNORMAL LOW (ref 12.0–15.0)
MCH: 29.1 pg (ref 26.0–34.0)
MCHC: 30.8 g/dL (ref 30.0–36.0)
MCV: 94.2 fL (ref 80.0–100.0)
Platelets: 234 10*3/uL (ref 150–400)
RBC: 3.82 MIL/uL — ABNORMAL LOW (ref 3.87–5.11)
RDW: 14.4 % (ref 11.5–15.5)
WBC: 8.5 10*3/uL (ref 4.0–10.5)
nRBC: 0 % (ref 0.0–0.2)

## 2023-05-14 LAB — BASIC METABOLIC PANEL
Anion gap: 9 (ref 5–15)
BUN: 16 mg/dL (ref 8–23)
CO2: 26 mmol/L (ref 22–32)
Calcium: 8.7 mg/dL — ABNORMAL LOW (ref 8.9–10.3)
Chloride: 106 mmol/L (ref 98–111)
Creatinine, Ser: 0.6 mg/dL (ref 0.44–1.00)
GFR, Estimated: >60 mL/min (ref >60)
Glucose, Bld: 102 mg/dL — ABNORMAL HIGH (ref 70–99)
Potassium: 4 mmol/L (ref 3.5–5.1)
Sodium: 141 mmol/L (ref 135–145)

## 2023-05-14 MED ORDER — GUAIFENESIN ER 600 MG PO TB12
600.0000 mg | ORAL_TABLET | Freq: Two times a day (BID) | ORAL | Status: AC
  Administered 2023-05-14 (×2): 600 mg via ORAL
  Filled 2023-05-14 (×2): qty 1

## 2023-05-14 MED ORDER — PSEUDOEPHEDRINE HCL ER 120 MG PO TB12
120.0000 mg | ORAL_TABLET | Freq: Two times a day (BID) | ORAL | Status: AC
  Administered 2023-05-14 (×2): 120 mg via ORAL
  Filled 2023-05-14 (×2): qty 1

## 2023-05-14 NOTE — Progress Notes (Signed)
PROGRESS NOTE  [name]    DOB: 01-Jul-1946, 77 y.o.  ZOX:[id number]    Code Status: Full Code   DOA: 05/06/2023   LOS: 5   Brief hospital course  [name] is a 77 y.o. female with a PMH significant for anxiety, hypothyroid, advanced COPD, hyperlipidemia, hypertension.  They presented from home to the ED on 05/06/2023 with SOB x 2 days with increased phlegm production. She is a former tobacco user. At her peak she smoked 2 packs/day. She denies EtOH and recreational drug use. At baseline she is minimally ambulatory and lives alone but has daily caregivers.   In the ED, it was found that they had temperature 97.9, respiration rate of 19, heart rate 141, blood pressure 163/99, SpO2 96% on 2 L nasal cannula.  Significant findings included sodium is 143, potassium 4.0, chloride 107, bicarb 26, BUN of 8, serum creatinine of 0.53, eGFR greater than 60, nonfasting blood glucose 119, WBC 9.7, hemoglobin 12.3, platelets of 378.High sensitive troponin was 10.  Chest x-ray: Read as emphysematous changes without acute cardiopulmonary process.   They were initially treated with DuoNebs one-time treatment, Solu-Medrol 40 mg IV one-time dose, metoprolol 25 mg p.o. one-time dose, sodium chloride 1 L bolus.   Patient was admitted to medicine service for further workup and management of COPD exacerbation as outlined in detail below.  6/20-6/22 -stable, improved to her baseline respiratory status.   6/22: patient was evaluated by psych yesterday and determined to have capacity. Continues to remain hospitalized for unsafe dispo plan. Grandson to come in tomorrow to discuss placement in ALF or other facility.   6/23: patient evaluated by PT who recommends SNF. Patient is agreeable to SNF as long as it isn't [facility]. Her grandson visited today and is in agreement with the plan as well and working on care for after SNF as well. TOC engaged for placement. Medically ready for dc when SNF placement available.    Assessment & Plan  Principal Problem:   COPD exacerbation (HCC) Active Problems:   Chronic pain syndrome   CAD (coronary artery disease)   Essential hypertension   Underweight   HLD (hyperlipidemia)   Hypothyroidism   Anxiety and depression   Protein-calorie malnutrition, severe (HCC)   PAD (peripheral artery disease) (HCC)   Acute on chronic respiratory failure with hypoxemia (HCC)   COPD (chronic obstructive pulmonary disease) (HCC)   Dementia with behavioral disturbance (HCC)  COPD exacerbation (HCC)- had increase in oxygen dependence from baseline 2.5L. Remains stable several days on home oxygen level. S/p Solu-Medrol 40 mg IV  Cough is improved and she feels at baseline.  - continue DuoNebs PRN - Maintain SpO2 greater than 88% -  azithromycin (completed 6/19) - continue dulera BID - completed steroid pack - ambulate with pulse ox   CAD  HLD Atorvastatin 10 mg nightly resumed   Essential hypertension- moderately well controlled Resumed home amlodipine 5 mg daily, metoprolol tartrate 25 mg p.o. twice daily   Hypothyroidism Levothyroxine 50 mcg every morning resumed   Anxiety and depression Home trazodone 300 mg nightly resumed Home hydroxyzine 25 mg 3 times daily as needed for anxiety   Protein-calorie malnutrition, severe (HCC) Home Ensure between meals resumed  Body mass index is 15.29 kg/m.  VTE ppx: enoxaparin (LOVENOX) injection 30 mg Start: 05/10/23 2200 Place TED hose Start: 05/06/23 1755  Diet:     Diet   Diet regular Fluid consistency: Thin   Consultants: None   Subjective 05/14/23  Pt reports feeling well. She has unchanged chronic pain in her R foot. Continues to endorse that she is agreeable to going to SNF   Objective   Vitals:   05/12/23 2346 05/13/23 0731 05/13/23 1530 05/14/23 0119  BP: 116/68 (!) 153/74 125/68 (!) 127/59  Pulse: 88 88 88 76  Resp: 18 18 18 20   Temp: 97.7 F (36.5 C) 98 F (36.7 C)  98 F (36.7 C)   TempSrc:      SpO2: 100% 100% 99% 99%  Weight:        Intake/Output Summary (Last 24 hours) at 05/14/2023 0735 Last data filed at 05/14/2023 0447 Gross per 24 hour  Intake 240 ml  Output 400 ml  Net -160 ml    Filed Weights   05/07/23 0015  Weight: 40.4 kg     Physical Exam:  General: awake, alert, NAD. Frail HEENT: atraumatic, clear conjunctiva, anicteric sclera, MMM, hard of hearing Respiratory: normal respiratory effort. CTAB Cardiovascular: quick capillary refill Nervous: A&O x3. no gross focal neurologic deficits, normal speech Extremities: moves all equally, no edema, normal tone. S/p toe amputations  Skin: dry, intact, normal temperature, normal color. No rashes, lesions or ulcers on exposed skin Psychiatry: congruent affect. Mildly anxious.   Labs   I have personally reviewed the following labs and imaging studies CBC    Component Value Date/Time   WBC 8.5 05/14/2023 0530   RBC 3.82 (L) 05/14/2023 0530   HGB 11.1 (L) 05/14/2023 0530   HCT 36.0 05/14/2023 0530   PLT 234 05/14/2023 0530   MCV 94.2 05/14/2023 0530   MCH 29.1 05/14/2023 0530   MCHC 30.8 05/14/2023 0530   RDW 14.4 05/14/2023 0530   LYMPHSABS 1.8 05/06/2023 1605   MONOABS 0.6 05/06/2023 1605   EOSABS 0.9 (H) 05/06/2023 1605   BASOSABS 0.1 05/06/2023 1605      Latest Ref Rng & Units 05/14/2023    5:30 AM 05/13/2023    4:16 AM 05/07/2023    4:15 AM  BMP  Glucose 70 - 99 mg/dL 254  62  270   BUN 8 - 23 mg/dL 16  15  10    Creatinine 0.44 - 1.00 mg/dL 6.23  7.62  8.31   Sodium 135 - 145 mmol/L 141  139  143   Potassium 3.5 - 5.1 mmol/L 4.0  3.2  4.0   Chloride 98 - 111 mmol/L 106  100  110   CO2 22 - 32 mmol/L 26  29  25    Calcium 8.9 - 10.3 mg/dL 8.7  8.5  8.3     Disposition Plan & Communication  Patient status: Inpatient  Admitted From: Home Planned disposition location: SNF Anticipated discharge date: 6/25 pending safe dispo plan. Medically ready for dc  Family Communication:  grandson on phone   Author: Leeroy Bock, DO [facility] 05/14/2023, 7:35 AM   Available by Epic secure chat 7AM-7PM. If 7PM-7AM, please contact night-coverage.  [facility] contact information found on [email or web address].

## 2023-05-14 NOTE — Progress Notes (Signed)
Pt started coughing when she was eating her dinner and vomited up the bites she was eating. Pt now fine.

## 2023-05-15 DIAGNOSIS — J441 Chronic obstructive pulmonary disease with (acute) exacerbation: Secondary | ICD-10-CM | POA: Diagnosis not present

## 2023-05-15 MED ORDER — HYDROCODONE-ACETAMINOPHEN 7.5-325 MG PO TABS
1.0000 | ORAL_TABLET | Freq: Three times a day (TID) | ORAL | Status: DC
  Administered 2023-05-15 – 2023-05-21 (×18): 1 via ORAL
  Filled 2023-05-15 (×18): qty 1

## 2023-05-15 MED ORDER — SENNA 8.6 MG PO TABS
1.0000 | ORAL_TABLET | Freq: Every day | ORAL | Status: DC | PRN
  Administered 2023-05-16 – 2023-05-20 (×3): 8.6 mg via ORAL
  Filled 2023-05-15 (×3): qty 1

## 2023-05-15 MED ORDER — HYDROXYZINE HCL 25 MG PO TABS
25.0000 mg | ORAL_TABLET | Freq: Three times a day (TID) | ORAL | Status: DC | PRN
  Administered 2023-05-15 – 2023-05-21 (×10): 25 mg via ORAL
  Filled 2023-05-15 (×10): qty 1

## 2023-05-15 MED ORDER — GLYCERIN (LAXATIVE) 2 G RE SUPP
1.0000 | Freq: Every day | RECTAL | Status: DC | PRN
  Filled 2023-05-15: qty 1

## 2023-05-15 MED ORDER — POLYETHYLENE GLYCOL 3350 17 G PO PACK
17.0000 g | PACK | Freq: Two times a day (BID) | ORAL | Status: DC
  Administered 2023-05-15 – 2023-05-21 (×10): 17 g via ORAL
  Filled 2023-05-15 (×12): qty 1

## 2023-05-15 NOTE — Progress Notes (Signed)
PT Cancellation Note  Patient Details Name: [name] MRN: [id number] DOB: 1946/11/02   Cancelled Treatment:     Therapist in to see pt this pm. Pt stated she sat EOB earlier and has just now been able to catch her breath. Pt states she can not tolerate PT due to severe Right great toe pain, also worried it will need to be amputated. Will re-attempt per POC next available date/time. Possible d/c to SNF tomorrow.    [name] 05/15/2023, 2:36 PM

## 2023-05-15 NOTE — Progress Notes (Addendum)
Occupational Therapy Treatment Patient Details Name: [name] MRN: [id number] DOB: Apr 02, 1946 Today's Date: 05/15/2023   History of present illness [name] is a 77 y.o. female with medical history significant of COPD (on 2-3L oxygen), hypertension, hyperlipidemia, PVD, CAD, hypothyroidism, GERD, depression with anxiety, anemia, former smoker, medication noncompliance, who presents with shortness of breath.   OT comments  Pt received sitting up in bed, agreeable to OT session. Pt on 3L [place] SPO2% throughout tx, using inhaler. Pt originally requests to work on grooming tasks seated EOB. Performs bed mobility MOD I to seated EOB. Once seated EOB, pt hyperverbal throughout OT tx, requiring frequent cuing to focus on breathing as she continually reports SOB. SPO2% monitored, 94-99% throughout. Pt performing therapeutic activity (see flowsheet for details) to improve functional performance with UB/LB ADLs. Pt anxious, worried about falls even with OT standing next to her seated EOB. Pt completes short burst of activity before requesting to lie back down, declining further participation d/t fatigue. Pt continues to benefit from skilled OT for education in energy conservation, improvement of general activity tolerance/strengthening, and to reduce the risk of falls. Pt left in bed, all needs within reach, alarm activated and call bell/phone nearby. Will continue to progress towards OT POC.    Recommendations for follow up therapy are one component of a multi-disciplinary discharge planning process, led by the attending physician.  Recommendations may be updated based on patient status, additional functional criteria and insurance authorization.    Assistance Recommended at Discharge Frequent or constant Supervision/Assistance  Patient can return home with the following  A little help with walking and/or transfers;A lot of help with bathing/dressing/bathroom;Assistance with cooking/housework;Direct  supervision/assist for medications management;Direct supervision/assist for financial management;Assist for transportation;Help with stairs or ramp for entrance   Equipment Recommendations  Other (comment)       Precautions / Restrictions Precautions Precautions: Fall Restrictions Weight Bearing Restrictions: No       Mobility Bed Mobility Overal bed mobility: Modified Independent             General bed mobility comments: able to t/f from EOB to supine without assist.                   Balance Overall balance assessment: Needs assistance Sitting-balance support: Feet supported Sitting balance-Leahy Scale: Good                                     ADL either performed or assessed with clinical judgement   ADL Overall ADL's : Needs assistance/impaired Eating/Feeding: Set up                                          Extremity/Trunk Assessment Upper Extremity Assessment Upper Extremity Assessment: Generalized weakness   Lower Extremity Assessment Lower Extremity Assessment: Generalized weakness         Cognition Arousal/Alertness: Awake/alert Behavior During Therapy: Anxious (Hyperverbal; requires redirection to breathe d/t increased dyspena during tx) Overall Cognitive Status: Within Functional Limits for tasks assessed                                 General Comments: HOH. Need to repeat sentences x1, anxious over any movement, fearful of falling  Exercises Exercises: General Upper Extremity, General Lower Extremity General Exercises - Upper Extremity Shoulder Flexion: 10 reps, AROM, Seated (Functional movements for UB ADLs (washing hair, dressing), pt performing 10 reps of reaching behind head and arms outstretched) General Exercises - Lower Extremity Ankle Circles/Pumps: 10 reps, Both, Seated Hip Flexion/Marching: 20 reps, Both, Seated Toe Raises: 10 reps, Both, Seated Heel Raises: 10 reps,  Both       General Comments Pt using inhaler 2x during tx, pt on 2L SPO2% during session    Pertinent Vitals/ Pain       Pain Assessment Pain Assessment: No/denies pain   Frequency  Min 2X/week        Progress Toward Goals  OT Goals(current goals can now be found in the care plan section)  Progress towards OT goals: Progressing toward goals  Acute Rehab OT Goals OT Goal Formulation: With patient Time For Goal Achievement: 05/27/23 Potential to Achieve Goals: Good  Plan Discharge plan remains appropriate       AM-PAC OT "6 Clicks" Daily Activity     Outcome Measure   Help from another person eating meals?: None Help from another person taking care of personal grooming?: A Little Help from another person toileting, which includes using toliet, bedpan, or urinal?: A Lot Help from another person bathing (including washing, rinsing, drying)?: A Lot Help from another person to put on and taking off regular upper body clothing?: A Little Help from another person to put on and taking off regular lower body clothing?: A Little 6 Click Score: 17    End of Session Equipment Utilized During Treatment: Oxygen  OT Visit Diagnosis: Muscle weakness (generalized) (M62.81);Unsteadiness on feet (R26.81)   Activity Tolerance Other (comment) (Limited by SOB and anxiety)   Patient Left in bed;with call bell/phone within reach;with bed alarm set             Time: 1131-1152 OT Time Calculation (min): 21 min  Charges: OT General Charges $OT Visit: 1 Visit OT Treatments $Therapeutic Activity: 8-22 mins  Tarika Mckethan L. Liliahna Cudd, OTR/L  05/15/23, 2:46 PM

## 2023-05-15 NOTE — Progress Notes (Signed)
Nutrition Follow-up  DOCUMENTATION CODES:   Underweight, Severe malnutrition in context of chronic illness  INTERVENTION:   -Continue MVI with minerals daily -Continue Boost Breeze po TID, each supplement provides 250 kcal and 9 grams of protein  -Continue regular diet -Continue feeding assistance  NUTRITION DIAGNOSIS:   Severe Malnutrition related to chronic illness (COPD) as evidenced by moderate fat depletion, severe fat depletion, moderate muscle depletion, severe muscle depletion, percent weight loss.  Ongoing  GOAL:   Patient will meet greater than or equal to 90% of their needs  Progressing   MONITOR:   PO intake, Supplement acceptance  REASON FOR ASSESSMENT:   Malnutrition Screening Tool    ASSESSMENT:   Pt with history of anxiety, hypothyroid, advanced COPD, hyperlipidemia, hypertension, who presents for chief concerns of shortness of breath.  Reviewed I/O's: -160 ml x 24 hours and -2.5 L since admission  UOP: 400 ml x 24 hours   Pt lying in bed at time of visit. She complains that she is tired. Nurse tech at bedside trying to encourage pt to eat, but pt reports she feels to tired to do so. Meal tray in front of pt. Noted meal completions has improved; po 25-100%. Pt is refusing Boost Breeze supplements.   Pt having episodes of acute delirium this weekend. Psych evaluated on 05/11/23 and pt was not not deemed to be at an imminent risk to self or others.   No new wt since admission.   Per TOC notes, pt is medically stable and awaiting SNF placement.   Medications reviewed and include vitamin C, vitamin B-12, niferex, melatonin, protonix, and vitamin B1.   Labs reviewed: CBGS: 107 (inpatient orders for glycemic control are none).    Diet Order:   Diet Order             Diet regular Fluid consistency: Thin  Diet effective now                   EDUCATION NEEDS:   Education needs have been addressed  Skin:  Skin Assessment: Reviewed RN  Assessment  Last BM:  05/13/23  Height:   Ht Readings from Last 1 Encounters:  03/13/23 5\' 4"  (1.626 m)    Weight:   Wt Readings from Last 1 Encounters:  05/07/23 40.4 kg    Ideal Body Weight:  54.5 kg  BMI:  Body mass index is 15.29 kg/m.  Estimated Nutritional Needs:   Kcal:  1600-1800  Protein:  80-95 grams  Fluid:  > 1.6 L    Levada Schilling, RD, LDN, CDCES Registered Dietitian II Certified Diabetes Care and Education Specialist Please refer to [facility] for RD and/or RD on-call/weekend/after hours pager

## 2023-05-15 NOTE — TOC Progression Note (Signed)
Transition of Care [facility]) - Progression Note    Patient Details  Name: [name] MRN: [id number] Date of Birth: 07-Dec-1945  Transition of [facility]) CM/SW Contact  Garret Reddish, RN Phone Number: 05/15/2023, 1:56 PM  Clinical Narrative:   I have spoken with [name] and presented bed offers. She has chosen [facility]. I have informed Tiffany with [facility] that patient has accepted bed offer.  [facility] Sh[facility] will have a bed on tomorrow pending insurance approval.    I have asked Holly with TOC to completed [facility] authorization.  I have updated patient's grandson.    TOC will continue to follow for discharge planning.           Expected Discharge Plan and Services         Expected Discharge Date: 05/10/23                                     Social Determinants of Health (SDOH) Interventions SDOH Screenings   Food Insecurity: No Food Insecurity (05/07/2023)  Housing: Low Risk  (05/07/2023)  Transportation Needs: No Transportation Needs (05/07/2023)  Utilities: Not At Risk (05/07/2023)  Depression (PHQ2-9): Low Risk  (07/13/2021)  Tobacco Use: High Risk (05/06/2023)    Readmission Risk Interventions    01/31/2023   11:49 AM 07/05/2022   10:50 AM 11/25/2021    9:50 AM  Readmission Risk Prevention Plan  Transportation Screening Complete Complete Complete  Medication Review Oceanographer) Complete Complete Complete  PCP or Specialist appointment within 3-5 days of discharge Complete  Complete  HRI or Home Care Consult  Complete Complete  SW Recovery Care/Counseling Consult Complete Complete   Palliative Care Screening Not Applicable  Not Applicable  Skilled Nursing Facility Not Applicable Not Applicable Not Applicable

## 2023-05-15 NOTE — Progress Notes (Signed)
PROGRESS NOTE  [name]    DOB: [birth year], 77 y.o.  VOZ:[id number]    Code Status: Full Code   DOA: 05/06/2023   LOS: 6   Brief hospital course  [name] is a 77 y.o. female with a PMH significant for anxiety, hypothyroid, advanced COPD, hyperlipidemia, hypertension.  They presented from home to the ED on 05/06/2023 with SOB x 2 days with increased phlegm production. She is a former tobacco user. At her peak she smoked 2 packs/day. She denies EtOH and recreational drug use. At baseline she is minimally ambulatory and lives alone but has daily caregivers.   In the ED, it was found that they had temperature 97.9, respiration rate of 19, heart rate 141, blood pressure 163/99, SpO2 96% on 2 L nasal cannula.  Significant findings included sodium is 143, potassium 4.0, chloride 107, bicarb 26, BUN of 8, serum creatinine of 0.53, eGFR greater than 60, nonfasting blood glucose 119, WBC 9.7, hemoglobin 12.3, platelets of 378.High sensitive troponin was 10.  Chest x-ray: Read as emphysematous changes without acute cardiopulmonary process.   They were initially treated with DuoNebs one-time treatment, Solu-Medrol 40 mg IV one-time dose, metoprolol 25 mg p.o. one-time dose, sodium chloride 1 L bolus.   Patient was admitted to medicine service for further workup and management of COPD exacerbation as outlined in detail below.  6/20-6/22 -stable, improved to her baseline respiratory status.   6/22: patient was evaluated by psych yesterday and determined to have capacity. Continues to remain hospitalized for unsafe dispo plan. Grandson to come in tomorrow to discuss placement in ALF or other facility.   6/23: patient evaluated by PT who recommends SNF. Patient is agreeable to SNF as long as it isn't [facility]. Her grandson visited and is in agreement with the plan as well and working on care for after SNF as well. TOC engaged for placement. Medically ready for dc when SNF placement available.    Assessment & Plan  Principal Problem:   COPD exacerbation (HCC) Active Problems:   Chronic pain syndrome   CAD (coronary artery disease)   Essential hypertension   Underweight   HLD (hyperlipidemia)   Hypothyroidism   Anxiety and depression   Protein-calorie malnutrition, severe (HCC)   PAD (peripheral artery disease) (HCC)   Acute on chronic respiratory failure with hypoxemia (HCC)   COPD (chronic obstructive pulmonary disease) (HCC)   Dementia with behavioral disturbance (HCC)  COPD exacerbation (HCC)- had increase in oxygen dependence from baseline 2.5L. Remains stable several days on home oxygen level. S/p Solu-Medrol 40 mg IV  Cough is improved and she feels at baseline.  - continue DuoNebs PRN - Maintain SpO2 greater than 88% -  azithromycin (completed 6/19) - continue dulera BID - completed steroid pack - ambulate with pulse ox - add humidifier to oxygen for dryness treatment   CAD  HLD  PAD- no chest pain. Stable.  - Atorvastatin 10 mg nightly resumed - norco for LE pain management. Not a surgical candidate, per patient. Does not tolerate gabapentin.   Essential hypertension- moderately well controlled Resumed home amlodipine 5 mg daily, metoprolol tartrate 25 mg p.o. twice daily   Hypothyroidism Levothyroxine 50 mcg every morning resumed   Anxiety and depression- evaluated by psych this admission to assess capacity and determined to have capacity. She had episode of delusions but has not had an episode in several days since so maybe related to medications/hospital delirium.  Home trazodone 300 mg nightly resumed Home hydroxyzine 25  mg 3 times daily as needed for anxiety   Protein-calorie malnutrition, severe (HCC)  failure to thrive- Home Ensure between meals resumed  Body mass index is 15.29 kg/m.  VTE ppx: enoxaparin (LOVENOX) injection 30 mg Start: 05/10/23 2200 Place TED hose Start: 05/06/23 1755  Diet:     Diet   Diet regular Fluid  consistency: Thin   Consultants: Psychiatry   Subjective 05/15/23    Pt reports feeling well. She has unchanged chronic pain in her R foot. Continues to endorse that she is agreeable to going to SNF. Endorses having a dry mouth. Yesterday received mucinex and sudafed for nasal drainage which has resolved but likely caused the dryness now.    Objective   Vitals:   05/14/23 2114 05/14/23 2121 05/14/23 2324 05/15/23 0740  BP: 120/78 120/78 105/62 136/77  Pulse: (!) 109 (!) 109 98 95  Resp:   17 16  Temp:   98.1 F (36.7 C) 97.7 F (36.5 C)  TempSrc:    Oral  SpO2:   100% 99%  Weight:        Intake/Output Summary (Last 24 hours) at 05/15/2023 0747 Last data filed at 05/14/2023 1603 Gross per 24 hour  Intake 240 ml  Output 400 ml  Net -160 ml    Filed Weights   05/07/23 0015  Weight: 40.4 kg    Physical Exam:  General: awake, alert, NAD. Frail HEENT: atraumatic, clear conjunctiva, anicteric sclera, MMM, hard of hearing Respiratory: normal respiratory effort. CTAB Cardiovascular: quick capillary refill Nervous: A&O x3. no gross focal neurologic deficits, normal speech Extremities: moves all equally, no edema, normal tone. S/p toe amputations  Skin: dry, intact, normal temperature, normal color. No rashes, lesions or ulcers on exposed skin Psychiatry: congruent affect. Mildly anxious.   Labs   I have personally reviewed the following labs and imaging studies CBC    Component Value Date/Time   WBC 8.5 05/14/2023 0530   RBC 3.82 (L) 05/14/2023 0530   HGB 11.1 (L) 05/14/2023 0530   HCT 36.0 05/14/2023 0530   PLT 234 05/14/2023 0530   MCV 94.2 05/14/2023 0530   MCH 29.1 05/14/2023 0530   MCHC 30.8 05/14/2023 0530   RDW 14.4 05/14/2023 0530   LYMPHSABS 1.8 05/06/2023 1605   MONOABS 0.6 05/06/2023 1605   EOSABS 0.9 (H) 05/06/2023 1605   BASOSABS 0.1 05/06/2023 1605      Latest Ref Rng & Units 05/14/2023    5:30 AM 05/13/2023    4:16 AM 05/07/2023    4:15 AM  BMP   Glucose 70 - 99 mg/dL 161  62  096   BUN 8 - 23 mg/dL 16  15  10    Creatinine 0.44 - 1.00 mg/dL 0.45  4.09  8.11   Sodium 135 - 145 mmol/L 141  139  143   Potassium 3.5 - 5.1 mmol/L 4.0  3.2  4.0   Chloride 98 - 111 mmol/L 106  100  110   CO2 22 - 32 mmol/L 26  29  25    Calcium 8.9 - 10.3 mg/dL 8.7  8.5  8.3     Disposition Plan & Communication  Patient status: Inpatient  Admitted From: Home Planned disposition location: SNF Anticipated discharge date: 6/26 pending safe dispo plan. Medically ready for dc  Family Communication: grandson on phone   Author: Leeroy Bock, DO [facility] 05/15/2023, 7:47 AM   Available by Epic secure chat 7AM-7PM. If 7PM-7AM, please contact night-coverage.  [facility] contact information  found on [email or web address].

## 2023-05-15 NOTE — Plan of Care (Signed)
  Problem: Education: Goal: Knowledge of disease or condition will improve Outcome: Progressing   Problem: Respiratory: Goal: Ability to maintain a clear airway will improve Outcome: Progressing   Problem: Education: Goal: Knowledge of General Education information will improve Description: Including pain rating scale, medication(s)/side effects and non-pharmacologic comfort measures Outcome: Progressing   Problem: Clinical Measurements: Goal: Ability to maintain clinical measurements within normal limits will improve Outcome: Progressing   Problem: Nutrition: Goal: Adequate nutrition will be maintained Outcome: Progressing   Problem: Coping: Goal: Level of anxiety will decrease Outcome: Progressing

## 2023-05-15 NOTE — Progress Notes (Signed)
OT Cancellation Note  Patient Details Name: [name] MRN: [id number] DOB: 1946/04/02   Cancelled Treatment:    Reason Eval/Treat Not Completed: Pain limiting ability to participate;Other (comment) (Pt requesting OT return after her pain meds) .Will check back after medication for OT tx.   Lise Auer Fard Borunda 05/15/2023, 10:37 AM

## 2023-05-16 DIAGNOSIS — J441 Chronic obstructive pulmonary disease with (acute) exacerbation: Secondary | ICD-10-CM | POA: Diagnosis not present

## 2023-05-16 MED ORDER — POLYETHYLENE GLYCOL 3350 17 G PO PACK: 17.0000 g | PACK | Freq: Every day | ORAL | 0 refills | Status: DC | PRN

## 2023-05-16 MED ORDER — ASPIRIN 81 MG PO TBEC
81.0000 mg | DELAYED_RELEASE_TABLET | Freq: Every day | ORAL | Status: DC
  Administered 2023-05-16 – 2023-05-21 (×6): 81 mg via ORAL
  Filled 2023-05-16 (×6): qty 1

## 2023-05-16 MED ORDER — HYDROCODONE-ACETAMINOPHEN 7.5-325 MG PO TABS: 1.0000 | ORAL_TABLET | Freq: Three times a day (TID) | ORAL | 0 refills | Status: DC

## 2023-05-16 NOTE — Care Management Important Message (Signed)
Important Message  Patient Details  Name: [name] MRN: [id number] Date of Birth: 20-Sep-1946   Medicare Important Message Given:  Yes     Olegario Messier A Belynda Pagaduan 05/16/2023, 1:58 PM

## 2023-05-16 NOTE — Progress Notes (Signed)
   05/16/23 1500  Spiritual Encounters  Type of Visit Initial  Care provided to: Patient  Referral source Chaplain assessment  Reason for visit Routine spiritual support  OnCall Visit No  Interventions  Spiritual Care Interventions Made Established relationship of care and support;Compassionate presence;Encouragement  Intervention Outcomes  Outcomes Connection to spiritual care  Spiritual Care Plan  Spiritual Care Issues Still Outstanding No further spiritual care needs at this time (see row info)   Chaplain provided spiritual support with compassionate presence and reflective listening.Chaplain services remain available for follow up spiritual and emotional support as needed. Please consult if needs arise

## 2023-05-16 NOTE — Plan of Care (Signed)
  Problem: Education: Goal: Knowledge of disease or condition will improve Outcome: Progressing   Problem: Activity: Goal: Ability to tolerate increased activity will improve Outcome: Progressing   Problem: Education: Goal: Knowledge of General Education information will improve Description: Including pain rating scale, medication(s)/side effects and non-pharmacologic comfort measures Outcome: Progressing   Problem: Clinical Measurements: Goal: Ability to maintain clinical measurements within normal limits will improve Outcome: Progressing   Problem: Coping: Goal: Level of anxiety will decrease Outcome: Progressing   Problem: Elimination: Goal: Will not experience complications related to bowel motility Outcome: Progressing

## 2023-05-16 NOTE — TOC Progression Note (Signed)
Transition of Care [facility]) - Progression Note    Patient Details  Name: [name] MRN: [id number] Date of Birth: Aug 10, 1946  Transition of Care [facility]) CM/SW Contact  Garret Reddish, RN Phone Number: 05/16/2023, 11:33 AM  Clinical Narrative:   SNF authorization still pending.  I have  updated Naval architect.    TOC will continue to follow for discharge planning.            Expected Discharge Plan and Services         Expected Discharge Date: 05/10/23                                     Social Determinants of Health (SDOH) Interventions SDOH Screenings   Food Insecurity: No Food Insecurity (05/07/2023)  Housing: Low Risk  (05/07/2023)  Transportation Needs: No Transportation Needs (05/07/2023)  Utilities: Not At Risk (05/07/2023)  Depression (PHQ2-9): Low Risk  (07/13/2021)  Tobacco Use: High Risk (05/06/2023)    Readmission Risk Interventions    01/31/2023   11:49 AM 07/05/2022   10:50 AM 11/25/2021    9:50 AM  Readmission Risk Prevention Plan  Transportation Screening Complete Complete Complete  Medication Review Oceanographer) Complete Complete Complete  PCP or Specialist appointment within 3-5 days of discharge Complete  Complete  HRI or Home Care Consult  Complete Complete  SW Recovery Care/Counseling Consult Complete Complete   Palliative Care Screening Not Applicable  Not Applicable  Skilled Nursing Facility Not Applicable Not Applicable Not Applicable

## 2023-05-16 NOTE — Discharge Summary (Signed)
Physician Discharge Summary  Margeart Varnell [facility] ZOX:[id number] DOB: [birth year] DOA: 05/06/2023  PCP: [name], MD  Admit date: 05/06/2023 Discharge date: 05/21/2023  Admitted From: Home Disposition:  SNF  Recommendations for Outpatient Follow-up:  Follow up with PCP in 1-2 weeks   Home Health:No Equipment/Devices:None   Discharge Condition:Stable  CODE STATUS:FULL  Diet recommendation: Regular  Brief/Interim Summary: 77 y.o. female with a PMH significant for anxiety, hypothyroid, advanced COPD, hyperlipidemia, hypertension.   They presented from home to the ED on 05/06/2023 with SOB x 2 days with increased phlegm production. She is a former tobacco user. At her peak she smoked 2 packs/day. She denies EtOH and recreational drug use. At baseline she is minimally ambulatory and lives alone but has daily caregivers.    In the ED, it was found that they had temperature 97.9, respiration rate of 19, heart rate 141, blood pressure 163/99, SpO2 96% on 2 L nasal cannula.  Significant findings included sodium is 143, potassium 4.0, chloride 107, bicarb 26, BUN of 8, serum creatinine of 0.53, eGFR greater than 60, nonfasting blood glucose 119, WBC 9.7, hemoglobin 12.3, platelets of 378.High sensitive troponin was 10.  Chest x-ray: Read as emphysematous changes without acute cardiopulmonary process.    They were initially treated with DuoNebs one-time treatment, Solu-Medrol 40 mg IV one-time dose, metoprolol 25 mg p.o. one-time dose, sodium chloride 1 L bolus.    Patient was admitted to medicine service for further workup and management of COPD exacerbation as outlined in detail below.   6/20-6/22 -stable, improved to her baseline respiratory status.    6/22: patient was evaluated by psych yesterday and determined to have capacity. Continues to remain hospitalized for unsafe dispo plan. Grandson to come in tomorrow to discuss placement in ALF or other facility.    6/23: patient evaluated by PT  who recommends SNF. Patient is agreeable to SNF as long as it isn't [facility]. Her grandson visited and is in agreement with the plan as well and working on care for after SNF as well. TOC engaged for placement. Medically ready for dc when SNF.  Discharged on 7/1 after insurance auth obtained.    Discharge Diagnoses:  Principal Problem:   COPD exacerbation (HCC) Active Problems:   Chronic pain syndrome   CAD (coronary artery disease)   Essential hypertension   Underweight   HLD (hyperlipidemia)   Hypothyroidism   Anxiety and depression   Protein-calorie malnutrition, severe (HCC)   PAD (peripheral artery disease) (HCC)   Acute on chronic respiratory failure with hypoxemia (HCC)   COPD (chronic obstructive pulmonary disease) (HCC)   Dementia with behavioral disturbance (HCC)  COPD exacerbation (HCC)- had increase in oxygen dependence from baseline 2.5L. Remains stable several days on home oxygen level. S/p Solu-Medrol 40 mg IV  Cough is improved and she feels at baseline.  Plan: DC to SNF Completed steroid course Continue oxygen PRN Continue dulera PRN duoneb   CAD  HLD  PAD- no chest pain. Stable.  - Atorvastatin 10 mg nightly resumed - norco for LE pain management. Not a surgical candidate, per patient.  - Does not tolerate gabapentin.   Essential hypertension- moderately well controlled Resumed home amlodipine 5 mg daily,  metoprolol tartrate 25 mg p.o. twice daily   Hypothyroidism Levothyroxine 50 mcg every morning resumed   Anxiety and depression- evaluated by psych this admission to assess capacity and determined to have capacity. She had episode of delusions but has not had an episode in several days  since so maybe related to medications/hospital delirium.  Home trazodone 300 mg nightly resumed Home hydroxyzine 25 mg 3 times daily as needed for anxiety   Protein-calorie malnutrition, severe (HCC)  failure to thrive- Home Ensure between meals resumed  Discharge  Instructions   Allergies as of 05/16/2023   No Known Allergies      Medication List     TAKE these medications    albuterol 108 (90 Base) MCG/ACT inhaler Commonly known as: VENTOLIN HFA Inhale 1-2 puffs into the lungs every 4 (four) hours as needed for shortness of breath or wheezing.   ALPRAZolam 0.5 MG tablet Commonly known as: XANAX Take 2 tablets (1 mg total) by mouth 3 (three) times daily as needed for anxiety.   amLODipine 5 MG tablet Commonly known as: NORVASC Take 1 tablet (5 mg total) by mouth daily.   ammonium lactate 12 % lotion Commonly known as: LAC-HYDRIN APPLY TO AFFECTED AREA AS NEEDED FOR DRY SKIN   ascorbic acid 500 MG tablet Commonly known as: VITAMIN C Take 1 tablet (500 mg total) by mouth daily.   Aspirin Low Dose 81 MG tablet Generic drug: aspirin EC TAKE 1 TABLET BY MOUTH EVERY DAY   atorvastatin 10 MG tablet Commonly known as: Lipitor Take 1 tablet (10 mg total) by mouth daily.   benzonatate 100 MG capsule Commonly known as: TESSALON Take 1 capsule (100 mg total) by mouth 3 (three) times daily as needed for cough.   cyanocobalamin 1000 MCG tablet Take 1 tablet (1,000 mcg total) by mouth daily.   feeding supplement Liqd Take 237 mLs by mouth 3 (three) times daily between meals.   fluticasone-salmeterol 250-50 MCG/ACT Aepb Commonly known as: ADVAIR Inhale 1 puff into the lungs 2 (two) times daily as needed.   HYDROcodone-acetaminophen 7.5-325 MG tablet Commonly known as: NORCO Take 1 tablet by mouth every 8 (eight) hours for 3 days. SNF use only   hydrOXYzine 25 MG tablet Commonly known as: ATARAX Take 1 tablet (25 mg total) by mouth 3 (three) times daily as needed for anxiety.   ipratropium-albuterol 0.5-2.5 (3) MG/3ML Soln Commonly known as: DUONEB Take 3 mLs by nebulization every 6 (six) hours as needed. What changed: Another medication with the same name was removed. Continue taking this medication, and follow the directions  you see here.   iron polysaccharides 150 MG capsule Commonly known as: NIFEREX Take 1 capsule (150 mg total) by mouth daily.   levothyroxine 50 MCG tablet Commonly known as: SYNTHROID Take 50 mcg by mouth every morning.   melatonin 5 MG Tabs Take 1 tablet (5 mg total) by mouth at bedtime.   metoprolol tartrate 25 MG tablet Commonly known as: LOPRESSOR Take 1 tablet (25 mg total) by mouth 2 (two) times daily. Skip the dose if systolic BP less than 130 mmHg and/or heart rate less than 65 beats per minute   multivitamin with minerals Tabs tablet Take 1 tablet by mouth daily.   omeprazole 40 MG capsule Commonly known as: PRILOSEC Take 40 mg by mouth daily.   polyethylene glycol 17 g packet Commonly known as: MIRALAX / GLYCOLAX Take 17 g by mouth daily as needed.   potassium chloride SA 20 MEQ tablet Commonly known as: KLOR-CON M Take 20 mEq by mouth daily.   risperiDONE 0.25 MG tablet Commonly known as: RISPERDAL Take 1 tablet (0.25 mg total) by mouth at bedtime.   thiamine 100 MG tablet Commonly known as: Vitamin B-1 Take 1 tablet (100 mg total)  by mouth daily.   traZODone 150 MG tablet Commonly known as: DESYREL Take 300 mg by mouth at bedtime.   Vitamin D (Ergocalciferol) 1.25 MG (50000 UNIT) Caps capsule Commonly known as: DRISDOL Take 1 capsule (50,000 Units total) by mouth every 7 (seven) days.        Contact information for after-discharge care     Destination     [facility] Preferred SNF .   Service: Skilled Nursing Contact information: [address] [place] [address] [phone]                    No Known Allergies  Consultations: Psychiatry   Procedures/Studies: DG Chest Portable 1 View  Result Date: 05/06/2023 CLINICAL DATA:  Shortness of breath EXAM: PORTABLE CHEST 1 VIEW COMPARISON:  Chest radiograph dated 03/13/2023. FINDINGS:  The heart size and mediastinal contours are within normal limits. Vascular calcifications are seen in the aortic arch. Emphysematous changes are noted. No focal consolidation, pleural effusion, or pneumothorax. Fixation hardware overlies the cervical spine. There is osseous demineralization. Degenerative changes are seen in the spine. IMPRESSION: Emphysematous changes without acute cardiopulmonary process. Electronically Signed   By: Romona Curls M.D.   On: 05/06/2023 16:10      Subjective: Seen and examined on day of DC.  Anxious, but medically stable for discharge to SNF  Discharge Exam: Vitals:   05/15/23 2135 05/16/23 0820  BP: 139/74 124/66  Pulse: 95 88  Resp: 18 16  Temp: 98.2 F (36.8 C) 97.7 F (36.5 C)  SpO2: 99% 100%   Vitals:   05/15/23 1608 05/15/23 1611 05/15/23 2135 05/16/23 0820  BP: 99/72 99/72 139/74 124/66  Pulse: 91 91 95 88  Resp: 18 17 18 16   Temp: 98.1 F (36.7 C) 98.1 F (36.7 C) 98.2 F (36.8 C) 97.7 F (36.5 C)  TempSrc:  Oral    SpO2: 100% 100% 99% 100%  Weight:        General: Pt is alert, awake, not in acute distress Cardiovascular: RRR, S1/S2 +, no rubs, no gallops Respiratory: CTA bilaterally, no wheezing, no rhonchi Abdominal: Soft, NT, ND, bowel sounds + Extremities: no edema, no cyanosis    The results of significant diagnostics from this hospitalization (including imaging, microbiology, ancillary and laboratory) are listed below for reference.     Microbiology: No results found for this or any previous visit (from the past 240 hour(s)).   Labs: BNP (last 3 results) Recent Labs    01/10/23 2314 01/30/23 0740 02/24/23 1106  BNP 70.6 415.9* 51.2   Basic Metabolic Panel: Recent Labs  Lab 05/13/23 0416 05/14/23 0530  NA 139 141  K 3.2* 4.0  CL 100 106  CO2 29 26  GLUCOSE 62* 102*  BUN 15 16  CREATININE 0.63 0.60  CALCIUM 8.5* 8.7*   Liver Function Tests: No results for input(s): "AST", "ALT", "ALKPHOS", "BILITOT",  "PROT", "ALBUMIN" in the last 168 hours. No results for input(s): "LIPASE", "AMYLASE" in the last 168 hours. No results for input(s): "AMMONIA" in the last 168 hours. CBC: Recent Labs  Lab 05/13/23 0416 05/14/23 0530  WBC 7.5 8.5  HGB 10.9* 11.1*  HCT 35.1* 36.0  MCV 92.9 94.2  PLT 254 234   Cardiac Enzymes: No results for input(s): "CKTOTAL", "CKMB", "CKMBINDEX", "TROPONINI" in the last 168 hours. BNP: Invalid input(s): "POCBNP" CBG: No results for input(s): "GLUCAP" in the last 168 hours. D-Dimer No results  for input(s): "DDIMER" in the last 72 hours. Hgb A1c No results for input(s): "HGBA1C" in the last 72 hours. Lipid Profile No results for input(s): "CHOL", "HDL", "LDLCALC", "TRIG", "CHOLHDL", "LDLDIRECT" in the last 72 hours. Thyroid function studies No results for input(s): "TSH", "T4TOTAL", "T3FREE", "THYROIDAB" in the last 72 hours.  Invalid input(s): "FREET3" Anemia work up No results for input(s): "VITAMINB12", "FOLATE", "FERRITIN", "TIBC", "IRON", "RETICCTPCT" in the last 72 hours. Urinalysis    Component Value Date/Time   COLORURINE YELLOW (A) 02/24/2023 1251   APPEARANCEUR HAZY (A) 02/24/2023 1251   LABSPEC 1.020 02/24/2023 1251   PHURINE 5.0 02/24/2023 1251   GLUCOSEU NEGATIVE 02/24/2023 1251   HGBUR NEGATIVE 02/24/2023 1251   BILIRUBINUR NEGATIVE 02/24/2023 1251   KETONESUR 80 (A) 02/24/2023 1251   PROTEINUR 30 (A) 02/24/2023 1251   NITRITE NEGATIVE 02/24/2023 1251   LEUKOCYTESUR NEGATIVE 02/24/2023 1251   Sepsis Labs Recent Labs  Lab 05/13/23 0416 05/14/23 0530  WBC 7.5 8.5   Microbiology No results found for this or any previous visit (from the past 240 hour(s)).   Time coordinating discharge: Over 30 minutes  SIGNED:   Tresa Moore, MD  [facility] 05/16/2023, 9:19 AM Pager   If 7PM-7AM, please contact night-coverage

## 2023-05-16 NOTE — Plan of Care (Signed)
  Problem: Education: Goal: Knowledge of General Education information will improve Description: Including pain rating scale, medication(s)/side effects and non-pharmacologic comfort measures Outcome: Progressing   Problem: Health Behavior/Discharge Planning: Goal: Ability to manage health-related needs will improve Outcome: Progressing   Problem: Clinical Measurements: Goal: Diagnostic test results will improve Outcome: Progressing Goal: Respiratory complications will improve Outcome: Progressing Goal: Cardiovascular complication will be avoided Outcome: Progressing   Problem: Activity: Goal: Risk for activity intolerance will decrease Outcome: Progressing   Problem: Nutrition: Goal: Adequate nutrition will be maintained Outcome: Progressing   Problem: Coping: Goal: Level of anxiety will decrease Outcome: Progressing   Problem: Pain Managment: Goal: General experience of comfort will improve Outcome: Progressing   

## 2023-05-16 NOTE — Progress Notes (Signed)
PROGRESS NOTE    [name] [facility]  VWU:[id number] DOB: 1946-10-30 DOA: 05/06/2023 PCP: [name], MD    Brief Narrative:   77 y.o. female with a PMH significant for anxiety, hypothyroid, advanced COPD, hyperlipidemia, hypertension.   They presented from home to the ED on 05/06/2023 with SOB x 2 days with increased phlegm production. She is a former tobacco user. At her peak she smoked 2 packs/day. She denies EtOH and recreational drug use. At baseline she is minimally ambulatory and lives alone but has daily caregivers.    In the ED, it was found that they had temperature 97.9, respiration rate of 19, heart rate 141, blood pressure 163/99, SpO2 96% on 2 L nasal cannula.  Significant findings included sodium is 143, potassium 4.0, chloride 107, bicarb 26, BUN of 8, serum creatinine of 0.53, eGFR greater than 60, nonfasting blood glucose 119, WBC 9.7, hemoglobin 12.3, platelets of 378.High sensitive troponin was 10.  Chest x-ray: Read as emphysematous changes without acute cardiopulmonary process.    They were initially treated with DuoNebs one-time treatment, Solu-Medrol 40 mg IV one-time dose, metoprolol 25 mg p.o. one-time dose, sodium chloride 1 L bolus.    Patient was admitted to medicine service for further workup and management of COPD exacerbation as outlined in detail below.   6/20-6/22 -stable, improved to her baseline respiratory status.    6/22: patient was evaluated by psych yesterday and determined to have capacity. Continues to remain hospitalized for unsafe dispo plan. Grandson to come in tomorrow to discuss placement in ALF or other facility.    6/23: patient evaluated by PT who recommends SNF. Patient is agreeable to SNF as long as it isn't [facility]. Her grandson visited and is in agreement with the plan as well and working on care for after SNF as well. TOC engaged for placement. Medically ready for dc when SNF placement available.    Assessment & Plan:   Principal  Problem:   COPD exacerbation (HCC) Active Problems:   Chronic pain syndrome   CAD (coronary artery disease)   Essential hypertension   Underweight   HLD (hyperlipidemia)   Hypothyroidism   Anxiety and depression   Protein-calorie malnutrition, severe (HCC)   PAD (peripheral artery disease) (HCC)   Acute on chronic respiratory failure with hypoxemia (HCC)   COPD (chronic obstructive pulmonary disease) (HCC)   Dementia with behavioral disturbance (HCC)  COPD exacerbation (HCC)- had increase in oxygen dependence from baseline 2.5L. Remains stable several days on home oxygen level. S/p Solu-Medrol 40 mg IV  Cough is improved and she feels at baseline.  - continue DuoNebs PRN - Maintain SpO2 greater than 88% -  azithromycin (completed 6/19) - continue dulera BID - completed steroid pack - ambulate with pulse ox - add humidifier to oxygen for dryness treatment -Medically stable for discharge to skilled nursing facility   CAD  HLD  PAD- no chest pain. Stable.  - Atorvastatin 10 mg nightly resumed - norco for LE pain management. Not a surgical candidate, per patient. Does not tolerate gabapentin.   Essential hypertension- moderately well controlled Resumed home amlodipine 5 mg daily, metoprolol tartrate 25 mg p.o. twice daily   Hypothyroidism Levothyroxine 50 mcg every morning resumed   Anxiety and depression- evaluated by psych this admission to assess capacity and determined to have capacity. She had episode of delusions but has not had an episode in several days since so maybe related to medications/hospital delirium.  Home trazodone 300 mg nightly resumed Home  hydroxyzine 25 mg 3 times daily as needed for anxiety   Protein-calorie malnutrition, severe (HCC)  failure to thrive- Home Ensure between meals resumed  DVT prophylaxis: SQ Lovenox Code Status: Full Family Communication: None Disposition Plan: Status is: Inpatient Remains inpatient appropriate because: Unsafe  discharge plan.  Pending insurance authorization for discharge to skilled nursing facility.   Level of care: Med-Surg  Consultants:  None  Procedures:  None  Antimicrobials: None   Subjective: Seen and examined.  Resting in bed.  No visible distress  Objective: Vitals:   05/15/23 1611 05/15/23 2135 05/16/23 0820 05/16/23 1455  BP: 99/72 139/74 124/66 99/60  Pulse: 91 95 88 84  Resp: 17 18 16 18   Temp: 98.1 F (36.7 C) 98.2 F (36.8 C) 97.7 F (36.5 C) 98.1 F (36.7 C)  TempSrc: Oral     SpO2: 100% 99% 100% 97%  Weight:        Intake/Output Summary (Last 24 hours) at 05/16/2023 1526 Last data filed at 05/16/2023 1441 Gross per 24 hour  Intake 480 ml  Output 2100 ml  Net -1620 ml   Filed Weights   05/07/23 0015  Weight: 40.4 kg    Examination:  General exam: NAD.  Frail-appearing Respiratory system: Coarse breath sounds bilaterally.  Scattered crackles.  Normal work of breathing.  3 L Cardiovascular system: S1-S2, RRR, no murmurs, no pedal edema Gastrointestinal system: Soft, NT/ND, no bowel sounds Central nervous system: Alert and oriented. No focal neurological deficits. Extremities: Symmetric 3 x 5 power. Skin: No rashes, lesions or ulcers Psychiatry: Judgement and insight appear normal. Mood & affect appropriate.     Data Reviewed: I have personally reviewed following labs and imaging studies  CBC: Recent Labs  Lab 05/13/23 0416 05/14/23 0530  WBC 7.5 8.5  HGB 10.9* 11.1*  HCT 35.1* 36.0  MCV 92.9 94.2  PLT 254 234   Basic Metabolic Panel: Recent Labs  Lab 05/13/23 0416 05/14/23 0530  NA 139 141  K 3.2* 4.0  CL 100 106  CO2 29 26  GLUCOSE 62* 102*  BUN 15 16  CREATININE 0.63 0.60  CALCIUM 8.5* 8.7*   GFR: Estimated Creatinine Clearance: 38.8 mL/min (by C-G formula based on SCr of 0.6 mg/dL). Liver Function Tests: No results for input(s): "AST", "ALT", "ALKPHOS", "BILITOT", "PROT", "ALBUMIN" in the last 168 hours. No results for  input(s): "LIPASE", "AMYLASE" in the last 168 hours. No results for input(s): "AMMONIA" in the last 168 hours. Coagulation Profile: No results for input(s): "INR", "PROTIME" in the last 168 hours. Cardiac Enzymes: No results for input(s): "CKTOTAL", "CKMB", "CKMBINDEX", "TROPONINI" in the last 168 hours. BNP (last 3 results) No results for input(s): "PROBNP" in the last 8760 hours. HbA1C: No results for input(s): "HGBA1C" in the last 72 hours. CBG: No results for input(s): "GLUCAP" in the last 168 hours. Lipid Profile: No results for input(s): "CHOL", "HDL", "LDLCALC", "TRIG", "CHOLHDL", "LDLDIRECT" in the last 72 hours. Thyroid Function Tests: No results for input(s): "TSH", "T4TOTAL", "FREET4", "T3FREE", "THYROIDAB" in the last 72 hours. Anemia Panel: No results for input(s): "VITAMINB12", "FOLATE", "FERRITIN", "TIBC", "IRON", "RETICCTPCT" in the last 72 hours. Sepsis Labs: No results for input(s): "PROCALCITON", "LATICACIDVEN" in the last 168 hours.  No results found for this or any previous visit (from the past 240 hour(s)).       Radiology Studies: No results found.      Scheduled Meds:  amLODipine  5 mg Oral Daily   ascorbic acid  500 mg Oral  Daily   aspirin EC  81 mg Oral Daily   atorvastatin  10 mg Oral QHS   cyanocobalamin  1,000 mcg Oral Daily   enoxaparin (LOVENOX) injection  30 mg Subcutaneous Q24H   feeding supplement  1 Container Oral TID BM   HYDROcodone-acetaminophen  1 tablet Oral Q8H   iron polysaccharides  150 mg Oral Daily   levothyroxine  50 mcg Oral q morning   melatonin  5 mg Oral QHS   metoprolol tartrate  25 mg Oral BID   mometasone-formoterol  2 puff Inhalation BID   montelukast  10 mg Oral QHS   multivitamin with minerals  1 tablet Oral Daily   pantoprazole  80 mg Oral Daily   polyethylene glycol  17 g Oral BID   risperiDONE  0.25 mg Oral QHS   thiamine  100 mg Oral Daily   traZODone  300 mg Oral QHS   Continuous Infusions:   LOS: 7  days     Tresa Moore, MD [facility]   If 7PM-7AM, please contact night-coverage  05/16/2023, 3:26 PM

## 2023-05-17 DIAGNOSIS — J441 Chronic obstructive pulmonary disease with (acute) exacerbation: Secondary | ICD-10-CM | POA: Diagnosis not present

## 2023-05-17 MED ORDER — ALPRAZOLAM 0.25 MG PO TABS: 0.2500 mg | ORAL_TABLET | Freq: Two times a day (BID) | ORAL | Status: DC | PRN

## 2023-05-17 MED ORDER — ONDANSETRON HCL 4 MG/2ML IJ SOLN
4.0000 mg | Freq: Four times a day (QID) | INTRAMUSCULAR | Status: DC | PRN
  Administered 2023-05-17: 4 mg via INTRAVENOUS
  Filled 2023-05-17: qty 2

## 2023-05-17 MED ORDER — ALPRAZOLAM 1 MG PO TABS
1.0000 mg | ORAL_TABLET | Freq: Two times a day (BID) | ORAL | Status: DC | PRN
  Administered 2023-05-17 – 2023-05-21 (×8): 1 mg via ORAL
  Filled 2023-05-17 (×8): qty 1

## 2023-05-17 MED ORDER — ALPRAZOLAM 0.5 MG PO TABS: 1.0000 mg | ORAL_TABLET | Freq: Two times a day (BID) | ORAL | 0 refills | Status: AC | PRN

## 2023-05-17 MED ORDER — OXYCODONE HCL 5 MG PO TABS
5.0000 mg | ORAL_TABLET | Freq: Once | ORAL | Status: AC
  Administered 2023-05-17: 5 mg via ORAL
  Filled 2023-05-17: qty 1

## 2023-05-17 NOTE — TOC Progression Note (Signed)
Transition of Care [facility]) - Progression Note    Patient Details  Name: [name] MRN: [id number] Date of Birth: 1946-10-31  Transition of Care [facility]) CM/SW Contact  Garret Reddish, RN Phone Number: 05/17/2023, 11:28 AM  Clinical Narrative:   SNF authorization continues to be pending.    TOC will continue to follow for discharge planning.           Expected Discharge Plan and Services         Expected Discharge Date: 05/10/23                                     Social Determinants of Health (SDOH) Interventions SDOH Screenings   Food Insecurity: No Food Insecurity (05/07/2023)  Housing: Low Risk  (05/07/2023)  Transportation Needs: No Transportation Needs (05/07/2023)  Utilities: Not At Risk (05/07/2023)  Depression (PHQ2-9): Low Risk  (07/13/2021)  Tobacco Use: High Risk (05/06/2023)    Readmission Risk Interventions    01/31/2023   11:49 AM 07/05/2022   10:50 AM 11/25/2021    9:50 AM  Readmission Risk Prevention Plan  Transportation Screening Complete Complete Complete  Medication Review Oceanographer) Complete Complete Complete  PCP or Specialist appointment within 3-5 days of discharge Complete  Complete  HRI or Home Care Consult  Complete Complete  SW Recovery Care/Counseling Consult Complete Complete   Palliative Care Screening Not Applicable  Not Applicable  Skilled Nursing Facility Not Applicable Not Applicable Not Applicable

## 2023-05-17 NOTE — Progress Notes (Signed)
PROGRESS NOTE    [name] [facility]  ZOX:[id number] DOB: [birth year] DOA: 05/06/2023 PCP: [name], MD    Brief Narrative:   77 y.o. female with a PMH significant for anxiety, hypothyroid, advanced COPD, hyperlipidemia, hypertension.   They presented from home to the ED on 05/06/2023 with SOB x 2 days with increased phlegm production. She is a former tobacco user. At her peak she smoked 2 packs/day. She denies EtOH and recreational drug use. At baseline she is minimally ambulatory and lives alone but has daily caregivers.    In the ED, it was found that they had temperature 97.9, respiration rate of 19, heart rate 141, blood pressure 163/99, SpO2 96% on 2 L nasal cannula.  Significant findings included sodium is 143, potassium 4.0, chloride 107, bicarb 26, BUN of 8, serum creatinine of 0.53, eGFR greater than 60, nonfasting blood glucose 119, WBC 9.7, hemoglobin 12.3, platelets of 378.High sensitive troponin was 10.  Chest x-ray: Read as emphysematous changes without acute cardiopulmonary process.    They were initially treated with DuoNebs one-time treatment, Solu-Medrol 40 mg IV one-time dose, metoprolol 25 mg p.o. one-time dose, sodium chloride 1 L bolus.    Patient was admitted to medicine service for further workup and management of COPD exacerbation as outlined in detail below.   6/20-6/22 -stable, improved to her baseline respiratory status.    6/22: patient was evaluated by psych yesterday and determined to have capacity. Continues to remain hospitalized for unsafe dispo plan. Grandson to come in tomorrow to discuss placement in ALF or other facility.    6/23: patient evaluated by PT who recommends SNF. Patient is agreeable to SNF as long as it isn't [facility]. Her grandson visited and is in agreement with the plan as well and working on care for after SNF as well. TOC engaged for placement. Medically ready for dc when SNF placement available.    Assessment & Plan:   Principal  Problem:   COPD exacerbation (HCC) Active Problems:   Chronic pain syndrome   CAD (coronary artery disease)   Essential hypertension   Underweight   HLD (hyperlipidemia)   Hypothyroidism   Anxiety and depression   Protein-calorie malnutrition, severe (HCC)   PAD (peripheral artery disease) (HCC)   Acute on chronic respiratory failure with hypoxemia (HCC)   COPD (chronic obstructive pulmonary disease) (HCC)   Dementia with behavioral disturbance (HCC)  COPD exacerbation (HCC)- had increase in oxygen dependence from baseline 2.5L. Remains stable several days on home oxygen level. S/p Solu-Medrol 40 mg IV  Cough is improved and she feels at baseline.  - continue DuoNebs PRN - Maintain SpO2 greater than 88% -  azithromycin (completed 6/19) - continue dulera BID - completed steroid pack - ambulate with pulse ox - add humidifier to oxygen for dryness treatment -Medically stable for discharge to skilled nursing facility   CAD  HLD  PAD- no chest pain. Stable.  - Atorvastatin 10 mg nightly resumed - norco for LE pain management. Not a surgical candidate, per patient. Does not tolerate gabapentin.   Essential hypertension- moderately well controlled Resumed home amlodipine 5 mg daily,  metoprolol tartrate 25 mg p.o. twice daily   Hypothyroidism Levothyroxine 50 mcg every morning resumed   Anxiety and depression- evaluated by psych this admission to assess capacity and determined to have capacity. She had episode of delusions but has not had an episode in several days since so maybe related to medications/hospital delirium.  Home trazodone 300 mg nightly resumed  Home hydroxyzine 25 mg 3 times daily as needed for anxiety   Protein-calorie malnutrition, severe (HCC)  failure to thrive- Home Ensure between meals resumed  DVT prophylaxis: SQ Lovenox Code Status: Full Family Communication: None Disposition Plan: Status is: Inpatient Remains inpatient appropriate because: Unsafe  discharge plan.  Pending insurance authorization for discharge to skilled nursing facility.  Medically stable   Level of care: Med-Surg  Consultants:  None  Procedures:  None  Antimicrobials: None   Subjective: Seen and examined.  Resting in bed.  Endorses anxiety regarding leaving the hospital.  Objective: Vitals:   05/16/23 0820 05/16/23 1455 05/16/23 2131 05/17/23 0845  BP: 124/66 99/60 129/78 (!) 144/62  Pulse: 88 84 85 70  Resp: 16 18 20 19   Temp: 97.7 F (36.5 C) 98.1 F (36.7 C) 98 F (36.7 C) (!) 97.5 F (36.4 C)  TempSrc:      SpO2: 100% 97% 100% 100%  Weight:        Intake/Output Summary (Last 24 hours) at 05/17/2023 1445 Last data filed at 05/17/2023 0900 Gross per 24 hour  Intake 240 ml  Output 1350 ml  Net -1110 ml   Filed Weights   05/07/23 0015  Weight: 40.4 kg    Examination:  General exam: NAD.  Frail-appearing Respiratory system: Coarse breath sounds bilaterally.  Scattered crackles.  Normal work of breathing.  3 L Cardiovascular system: S1-S2, RRR, no murmurs, no pedal edema Gastrointestinal system: Soft, NT/ND, no bowel sounds Central nervous system: Alert and oriented. No focal neurological deficits. Extremities: Symmetric 3 x 5 power. Skin: No rashes, lesions or ulcers Psychiatry: Judgement and insight appear normal. Mood & affect anxious.     Data Reviewed: I have personally reviewed following labs and imaging studies  CBC: Recent Labs  Lab 05/13/23 0416 05/14/23 0530  WBC 7.5 8.5  HGB 10.9* 11.1*  HCT 35.1* 36.0  MCV 92.9 94.2  PLT 254 234   Basic Metabolic Panel: Recent Labs  Lab 05/13/23 0416 05/14/23 0530  NA 139 141  K 3.2* 4.0  CL 100 106  CO2 29 26  GLUCOSE 62* 102*  BUN 15 16  CREATININE 0.63 0.60  CALCIUM 8.5* 8.7*   GFR: Estimated Creatinine Clearance: 38.8 mL/min (by C-G formula based on SCr of 0.6 mg/dL). Liver Function Tests: No results for input(s): "AST", "ALT", "ALKPHOS", "BILITOT", "PROT",  "ALBUMIN" in the last 168 hours. No results for input(s): "LIPASE", "AMYLASE" in the last 168 hours. No results for input(s): "AMMONIA" in the last 168 hours. Coagulation Profile: No results for input(s): "INR", "PROTIME" in the last 168 hours. Cardiac Enzymes: No results for input(s): "CKTOTAL", "CKMB", "CKMBINDEX", "TROPONINI" in the last 168 hours. BNP (last 3 results) No results for input(s): "PROBNP" in the last 8760 hours. HbA1C: No results for input(s): "HGBA1C" in the last 72 hours. CBG: No results for input(s): "GLUCAP" in the last 168 hours. Lipid Profile: No results for input(s): "CHOL", "HDL", "LDLCALC", "TRIG", "CHOLHDL", "LDLDIRECT" in the last 72 hours. Thyroid Function Tests: No results for input(s): "TSH", "T4TOTAL", "FREET4", "T3FREE", "THYROIDAB" in the last 72 hours. Anemia Panel: No results for input(s): "VITAMINB12", "FOLATE", "FERRITIN", "TIBC", "IRON", "RETICCTPCT" in the last 72 hours. Sepsis Labs: No results for input(s): "PROCALCITON", "LATICACIDVEN" in the last 168 hours.  No results found for this or any previous visit (from the past 240 hour(s)).       Radiology Studies: No results found.      Scheduled Meds:  amLODipine  5 mg Oral  Daily   ascorbic acid  500 mg Oral Daily   aspirin EC  81 mg Oral Daily   atorvastatin  10 mg Oral QHS   cyanocobalamin  1,000 mcg Oral Daily   enoxaparin (LOVENOX) injection  30 mg Subcutaneous Q24H   feeding supplement  1 Container Oral TID BM   HYDROcodone-acetaminophen  1 tablet Oral Q8H   iron polysaccharides  150 mg Oral Daily   levothyroxine  50 mcg Oral q morning   melatonin  5 mg Oral QHS   metoprolol tartrate  25 mg Oral BID   mometasone-formoterol  2 puff Inhalation BID   montelukast  10 mg Oral QHS   multivitamin with minerals  1 tablet Oral Daily   pantoprazole  80 mg Oral Daily   polyethylene glycol  17 g Oral BID   risperiDONE  0.25 mg Oral QHS   thiamine  100 mg Oral Daily   traZODone  300  mg Oral QHS   Continuous Infusions:   LOS: 8 days     Tresa Moore, MD [facility]   If 7PM-7AM, please contact night-coverage  05/17/2023, 2:45 PM

## 2023-05-17 NOTE — Progress Notes (Signed)
Brief note.  Full note to follow.  Pending insurance authorization for discharge to skilled nursing facility.  Patient remains medically appropriate for discharge at this time.  Lolita Patella MD  No charge

## 2023-05-17 NOTE — Progress Notes (Signed)
Physical Therapy Treatment Patient Details Name: [name] MRN: [id number] DOB: Sep 12, 1946 Today's Date: 05/17/2023   History of Present Illness [name] is a 76 y.o. female with medical history significant of COPD (on 2-3L oxygen), hypertension, hyperlipidemia, PVD, CAD, hypothyroidism, GERD, depression with anxiety, anemia, former smoker, medication noncompliance, who presents with shortness of breath.    PT Comments    Pt tolerated OOB to chair prior to lunch with use of RW and CGA for safety. Overall, pt's strength is good throughout and unfortunately is limited due to severe dyspnea with minimal activity causing significant SOB and anxiety requiring frequent rest breaks. Pt remained on 3L O2 via [place] with SpO2 in mid 90's throughout session. Will continue to progress as tolerated. Currently awaiting transition to skilled nursing facility.    Recommendations for follow up therapy are one component of a multi-disciplinary discharge planning process, led by the attending physician.  Recommendations may be updated based on patient status, additional functional criteria and insurance authorization.  Follow Up Recommendations       Assistance Recommended at Discharge Intermittent Supervision/Assistance  Patient can return home with the following A little help with walking and/or transfers;A little help with bathing/dressing/bathroom;Assistance with cooking/housework;Direct supervision/assist for medications management;Assist for transportation;Help with stairs or ramp for entrance   Equipment Recommendations  None recommended by PT    Recommendations for Other Services       Precautions / Restrictions Precautions Precautions: Fall Restrictions Weight Bearing Restrictions: No     Mobility  Bed Mobility Overal bed mobility: Modified Independent             General bed mobility comments: able to t/f from EOB to supine without assist.    Transfers Overall transfer  level: Needs assistance Equipment used: Rolling walker (2 wheels) Transfers: Sit to/from Stand Sit to Stand: Min guard           General transfer comment: Patient able to stand with min guard + RW. Transferred to chair taking several short steps with no verbal report of pain.    Ambulation/Gait Ambulation/Gait assistance: Min guard Gait Distance (Feet):  (3) Assistive device: Rolling walker (2 wheels) Gait Pattern/deviations: Step-through pattern       General Gait Details: Pt was able to ambulate ~ 3 from bed to chair in room. No verbal complaint of pain using RW with min guard and 3L 02. O2 sat after = 94%   Stairs             Wheelchair Mobility    Modified Rankin (Stroke Patients Only)       Balance Overall balance assessment: Needs assistance Sitting-balance support: Feet supported Sitting balance-Leahy Scale: Good     Standing balance support: Bilateral upper extremity supported, During functional activity, Reliant on assistive device for balance Standing balance-Leahy Scale: Good Standing balance comment: Min guard due to general weakness                            Cognition Arousal/Alertness: Awake/alert Behavior During Therapy: Anxious Overall Cognitive Status: Within Functional Limits for tasks assessed                                 General Comments: HOH. Need to repeat sentences x1, anxious over any movement, fearful of falling        Exercises General Exercises - Lower Extremity Ankle Circles/Pumps: 10  reps, Seated, Right Long Arc Quad: AROM, Both, 10 reps    General Comments General comments (skin integrity, edema, etc.): Pt requires repeated education for pacing, decreasing conversation and PLB technique for energy conservation (Increased time)      Pertinent Vitals/Pain Pain Assessment Pain Assessment: No/denies pain    Home Living                          Prior Function            PT  Goals (current goals can now be found in the care plan section) Acute Rehab PT Goals Patient Stated Goal: To go home    Frequency    Min 2X/week      PT Plan Current plan remains appropriate    Co-evaluation              AM-PAC PT "6 Clicks" Mobility   Outcome Measure  Help needed turning from your back to your side while in a flat bed without using bedrails?: None Help needed moving from lying on your back to sitting on the side of a flat bed without using bedrails?: None Help needed moving to and from a bed to a chair (including a wheelchair)?: A Little Help needed standing up from a chair using your arms (e.g., wheelchair or bedside chair)?: A Little Help needed to walk in hospital room?: A Little Help needed climbing 3-5 steps with a railing? : A Lot 6 Click Score: 19    End of Session Equipment Utilized During Treatment: Gait belt;Oxygen (3L O2) Activity Tolerance: Patient limited by fatigue;Other (comment) (and DOE) Patient left: in chair;with call bell/phone within reach;with chair alarm set Nurse Communication: Mobility status;Other (comment) PT Visit Diagnosis: Unsteadiness on feet (R26.81);Other abnormalities of gait and mobility (R26.89);History of falling (Z91.81);Difficulty in walking, not elsewhere classified (R26.2);Dizziness and giddiness (R42)     Time: 1240-1300 PT Time Calculation (min) (ACUTE ONLY): 20 min  Charges:  $Therapeutic Activity: 8-22 mins                    Zadie Cleverly, PTA  [name] 05/17/2023, 4:10 PM

## 2023-05-17 NOTE — Progress Notes (Signed)
OT Cancellation Note  Patient Details Name: [name] MRN: [id number] DOB: [birth year]   Cancelled Treatment:    Reason Eval/Treat Not Completed: Other (comment) Stopped by pt's room, pt eating lunch, requests OT return later. Checked back on pt 15 mins later, she is still eating lunch. Will check back as able.   Lise Auer Malillany Kazlauskas 05/17/2023, 1:55 PM

## 2023-05-17 NOTE — Progress Notes (Signed)
Occupational Therapy Treatment Patient Details Name: [name] MRN: [id number] DOB: [birth year] Today's Date: 05/17/2023   History of present illness [name] is a 77 y.o. female with medical history significant of COPD (on 2-3L oxygen), hypertension, hyperlipidemia, PVD, CAD, hypothyroidism, GERD, depression with anxiety, anemia, former smoker, medication noncompliance, who presents with shortness of breath.   OT comments  Pt received in recliner, finishing lunch. Agreeable to OT session, focusing on static standing balance, improving tolerance to activity, functional transfers and self-care routine. Pt benefited from consistent redirection for breathing techniques as she frequently perseverates on topics, which contribute to her dyspnea. Performs sit to stand from recliner with CGA, and transfers using RW with CGA from recliner > bed. See flowsheet for additional details. Pt continues to benefit from skilled OT to address the above mentioned deficits, reduce risk of falls, and support optimal return to PLOF.    Recommendations for follow up therapy are one component of a multi-disciplinary discharge planning process, led by the attending physician.  Recommendations may be updated based on patient status, additional functional criteria and insurance authorization.    Assistance Recommended at Discharge Intermittent Supervision/Assistance  Patient can return home with the following  A little help with walking and/or transfers;A lot of help with bathing/dressing/bathroom;Assistance with cooking/housework;Direct supervision/assist for medications management;Direct supervision/assist for financial management;Assist for transportation;Help with stairs or ramp for entrance         Precautions / Restrictions Precautions Precautions: Fall Restrictions Weight Bearing Restrictions: No       Mobility Bed Mobility Overal bed mobility: Modified Independent             General bed  mobility comments: able to t/f from EOB to supine without assist.    Transfers Overall transfer level: Needs assistance Equipment used: Rolling walker (2 wheels) Transfers: Sit to/from Stand Sit to Stand: Min guard           General transfer comment: Stands with min guard impulsively, perseverates on falling. Tolerates bout of standing ~2 min 1x before needing to sit.     Balance Overall balance assessment: Needs assistance Sitting-balance support: Feet supported Sitting balance-Leahy Scale: Good     Standing balance support: Bilateral upper extremity supported, During functional activity, Reliant on assistive device for balance Standing balance-Leahy Scale: Good Standing balance comment: Min guard due to general weakness                           ADL either performed or assessed with clinical judgement   ADL Overall ADL's : Needs assistance/impaired Eating/Feeding: Set up   Grooming: Wash/dry hands;Wash/dry face;Set up                               Functional mobility during ADLs: Min guard;Rolling walker (2 wheels) General ADL Comments: Stayed up in chair after lunch; constant rediection to stay on task required    Extremity/Trunk Assessment Upper Extremity Assessment Upper Extremity Assessment: Generalized weakness   Lower Extremity Assessment Lower Extremity Assessment: Generalized weakness         Cognition Arousal/Alertness: Awake/alert Behavior During Therapy: Anxious, Impulsive Overall Cognitive Status: No family/caregiver present to determine baseline cognitive functioning                                 General Comments: HOH. Need to repeat sentences  x1, anxious over any movement, fearful of falling. Perseverates throughout tx, impulsive                   Pertinent Vitals/ Pain       Pain Assessment Pain Assessment: No/denies pain   Frequency  Min 2X/week        Progress Toward Goals  OT  Goals(current goals can now be found in the care plan section)  Progress towards OT goals: Progressing toward goals  Acute Rehab OT Goals OT Goal Formulation: With patient Time For Goal Achievement: 05/27/23 Potential to Achieve Goals: Good  Plan Discharge plan remains appropriate    Co-evaluation                 AM-PAC OT "6 Clicks" Daily Activity     Outcome Measure   Help from another person eating meals?: None Help from another person taking care of personal grooming?: A Little Help from another person toileting, which includes using toliet, bedpan, or urinal?: A Little Help from another person bathing (including washing, rinsing, drying)?: A Little Help from another person to put on and taking off regular upper body clothing?: A Little Help from another person to put on and taking off regular lower body clothing?: A Little 6 Click Score: 19    End of Session Equipment Utilized During Treatment: Oxygen;Gait belt;Rolling walker (2 wheels) (4L SpO2% via [place])  OT Visit Diagnosis: Muscle weakness (generalized) (M62.81);Unsteadiness on feet (R26.81)   Activity Tolerance Patient tolerated treatment well   Patient Left in bed;with call bell/phone within reach;with bed alarm set   Nurse Communication Mobility status        Time: 2130-8657 OT Time Calculation (min): 31 min  Charges: OT General Charges $OT Visit: 1 Visit OT Treatments $Self Care/Home Management : 23-37 mins  Stefanos Haynesworth L. Jorden Mahl, OTR/L  05/17/23, 3:34 PM

## 2023-05-17 NOTE — Plan of Care (Signed)
  Problem: Activity: Goal: Ability to tolerate increased activity will improve Outcome: Progressing Goal: Will verbalize the importance of balancing activity with adequate rest periods Outcome: Progressing   Problem: Respiratory: Goal: Ability to maintain a clear airway will improve Outcome: Progressing Goal: Levels of oxygenation will improve Outcome: Progressing Goal: Ability to maintain adequate ventilation will improve Outcome: Progressing   Problem: Elimination: Goal: Will not experience complications related to bowel motility Outcome: Progressing   Problem: Pain Managment: Goal: General experience of comfort will improve Outcome: Progressing   Problem: Safety: Goal: Ability to remain free from injury will improve Outcome: Progressing   Problem: Skin Integrity: Goal: Risk for impaired skin integrity will decrease Outcome: Progressing

## 2023-05-17 NOTE — Plan of Care (Signed)
  Problem: Education: Goal: Knowledge of disease or condition will improve Outcome: Progressing   Problem: Respiratory: Goal: Ability to maintain a clear airway will improve Outcome: Progressing   Problem: Education: Goal: Knowledge of General Education information will improve Description: Including pain rating scale, medication(s)/side effects and non-pharmacologic comfort measures Outcome: Progressing   Problem: Nutrition: Goal: Adequate nutrition will be maintained Outcome: Progressing   Problem: Coping: Goal: Level of anxiety will decrease Outcome: Progressing

## 2023-05-18 NOTE — Progress Notes (Signed)
PT Cancellation Note  Patient Details Name: Sumedha Matusik MRN: [id number] DOB: 1946-02-21   Cancelled Treatment:     Therapist in this pm. Pt resting in bed, lethargic with c/o pain and anxiety. Pt states she was up in the chair earlier in the day and is unable to tolerate any activity at this time. No pain meds available at this time. Will continue PT as tolerated per POC.    [name] 05/18/2023, 5:37 PM

## 2023-05-18 NOTE — TOC Progression Note (Addendum)
Transition of Care [facility]) - Progression Note    Patient Details  Name: [name] MRN: [id number] Date of Birth: [birth year]  Transition of Care [facility]) CM/SW Contact  Liliana Cline, LCSW Phone Number: 05/18/2023, 11:34 AM  Clinical Narrative:    [name] is pending.   2:15- [name] is pending.  4:20- Auth approved. Left VM and sent message for Tiffany at [facility] to see if there is any way patient can come today. Per Tiffany they will not be able to accept patient until Monday.       Expected Discharge Plan and Services         Expected Discharge Date: 05/18/23                                     Social Determinants of Health (SDOH) Interventions SDOH Screenings   Food Insecurity: No Food Insecurity (05/07/2023)  Housing: Low Risk  (05/07/2023)  Transportation Needs: No Transportation Needs (05/07/2023)  Utilities: Not At Risk (05/07/2023)  Depression (PHQ2-9): Low Risk  (07/13/2021)  Tobacco Use: High Risk (05/06/2023)    Readmission Risk Interventions    01/31/2023   11:49 AM 07/05/2022   10:50 AM 11/25/2021    9:50 AM  Readmission Risk Prevention Plan  Transportation Screening Complete Complete Complete  Medication Review Oceanographer) Complete Complete Complete  PCP or Specialist appointment within 3-5 days of discharge Complete  Complete  HRI or Home Care Consult  Complete Complete  SW Recovery Care/Counseling Consult Complete Complete   Palliative Care Screening Not Applicable  Not Applicable  Skilled Nursing Facility Not Applicable Not Applicable Not Applicable

## 2023-05-18 NOTE — Progress Notes (Signed)
Brief note.  Full note to follow.  Patient currently medically stable.  Pending insurance authorization for discharge to skilled nursing facility.  Medically appropriate for discharge at this time.  Lolita Patella MD  No charge

## 2023-05-19 DIAGNOSIS — J441 Chronic obstructive pulmonary disease with (acute) exacerbation: Secondary | ICD-10-CM | POA: Diagnosis not present

## 2023-05-19 NOTE — Progress Notes (Signed)
PROGRESS NOTE    [name] [facility]  ZOX:[id number] DOB: Jun 30, 1946 DOA: 05/06/2023 PCP: [name], MD    Brief Narrative:   77 y.o. female with a PMH significant for anxiety, hypothyroid, advanced COPD, hyperlipidemia, hypertension.   They presented from home to the ED on 05/06/2023 with SOB x 2 days with increased phlegm production. She is a former tobacco user. At her peak she smoked 2 packs/day. She denies EtOH and recreational drug use. At baseline she is minimally ambulatory and lives alone but has daily caregivers.    In the ED, it was found that they had temperature 97.9, respiration rate of 19, heart rate 141, blood pressure 163/99, SpO2 96% on 2 L nasal cannula.  Significant findings included sodium is 143, potassium 4.0, chloride 107, bicarb 26, BUN of 8, serum creatinine of 0.53, eGFR greater than 60, nonfasting blood glucose 119, WBC 9.7, hemoglobin 12.3, platelets of 378.High sensitive troponin was 10.  Chest x-ray: Read as emphysematous changes without acute cardiopulmonary process.    They were initially treated with DuoNebs one-time treatment, Solu-Medrol 40 mg IV one-time dose, metoprolol 25 mg p.o. one-time dose, sodium chloride 1 L bolus.    Patient was admitted to medicine service for further workup and management of COPD exacerbation as outlined in detail below.   6/20-6/22 -stable, improved to her baseline respiratory status.    6/22: patient was evaluated by psych yesterday and determined to have capacity. Continues to remain hospitalized for unsafe dispo plan. Grandson to come in tomorrow to discuss placement in ALF or other facility.    6/23: patient evaluated by PT who recommends SNF. Patient is agreeable to SNF as long as it isn't [facility]. Her grandson visited and is in agreement with the plan as well and working on care for after SNF as well. TOC engaged for placement. Medically ready for dc when SNF placement available.    Assessment & Plan:   Principal  Problem:   COPD exacerbation (HCC) Active Problems:   Chronic pain syndrome   CAD (coronary artery disease)   Essential hypertension   Underweight   HLD (hyperlipidemia)   Hypothyroidism   Anxiety and depression   Protein-calorie malnutrition, severe (HCC)   PAD (peripheral artery disease) (HCC)   Acute on chronic respiratory failure with hypoxemia (HCC)   COPD (chronic obstructive pulmonary disease) (HCC)   Dementia with behavioral disturbance (HCC)  COPD exacerbation (HCC)- had increase in oxygen dependence from baseline 2.5L. Remains stable several days on home oxygen level. S/p Solu-Medrol 40 mg IV  Cough is improved and she feels at baseline.  - continue DuoNebs PRN - Maintain SpO2 greater than 88% -  azithromycin (completed 6/19) - continue dulera BID - completed steroid pack, no further steroids indicated - ambulate with pulse ox - add humidifier to oxygen for dryness treatment -Medically stable for discharge to skilled nursing facility   CAD  HLD  PAD- no chest pain. Stable.  - Atorvastatin 10 mg nightly resumed - norco for LE pain management. Not a surgical candidate, per patient. Does not tolerate gabapentin.   Essential hypertension- moderately well controlled Resumed home amlodipine 5 mg daily,  metoprolol tartrate 25 mg p.o. twice daily   Hypothyroidism Levothyroxine 50 mcg every morning resumed   Anxiety and depression- evaluated by psych this admission to assess capacity and determined to have capacity. She had episode of delusions but has not had an episode in several days since so maybe related to medications/hospital delirium.  Home trazodone  300 mg nightly resumed Home hydroxyzine 25 mg 3 times daily as needed for anxiety   Protein-calorie malnutrition, severe (HCC)  failure to thrive- Home Ensure between meals resumed  DVT prophylaxis: SQ Lovenox Code Status: Full Family Communication: None Disposition Plan: Status is: Inpatient Remains  inpatient appropriate because: Unsafe discharge plan.  Insurance authorization obtained.  Patient will discharge to skilled nursing facility Monday 7/1   Level of care: Med-Surg  Consultants:  None  Procedures:  None  Antimicrobials: None   Subjective: Seen and examined.  Resting in bed.  No visible distress Objective: Vitals:   05/18/23 0819 05/18/23 2229 05/19/23 0003 05/19/23 0739  BP: (!) 126/57 116/61 118/62 (!) 108/54  Pulse: 62 78 79 68  Resp: 15 20 18 19   Temp: 98 F (36.7 C) 98.1 F (36.7 C) 98.6 F (37 C) (!) 97.5 F (36.4 C)  TempSrc:      SpO2: 100% 98% 98% 100%  Weight:        Intake/Output Summary (Last 24 hours) at 05/19/2023 1108 Last data filed at 05/19/2023 0542 Gross per 24 hour  Intake --  Output 550 ml  Net -550 ml   Filed Weights   05/07/23 0015  Weight: 40.4 kg    Examination:  General exam: NAD.  Appears frail Respiratory system: Bibasilar crackles.  Normal work of breathing.  3 L Cardiovascular system: S1-S2, RRR, no murmurs, no pedal edema Gastrointestinal system: Soft, NT/ND, no bowel sounds Central nervous system: Alert and oriented. No focal neurological deficits. Extremities: Symmetric 3 x 5 power. Skin: No rashes, lesions or ulcers Psychiatry: Judgement and insight appear normal. Mood & affect anxious.     Data Reviewed: I have personally reviewed following labs and imaging studies  CBC: Recent Labs  Lab 05/13/23 0416 05/14/23 0530  WBC 7.5 8.5  HGB 10.9* 11.1*  HCT 35.1* 36.0  MCV 92.9 94.2  PLT 254 234   Basic Metabolic Panel: Recent Labs  Lab 05/13/23 0416 05/14/23 0530  NA 139 141  K 3.2* 4.0  CL 100 106  CO2 29 26  GLUCOSE 62* 102*  BUN 15 16  CREATININE 0.63 0.60  CALCIUM 8.5* 8.7*   GFR: Estimated Creatinine Clearance: 38.8 mL/min (by C-G formula based on SCr of 0.6 mg/dL). Liver Function Tests: No results for input(s): "AST", "ALT", "ALKPHOS", "BILITOT", "PROT", "ALBUMIN" in the last 168  hours. No results for input(s): "LIPASE", "AMYLASE" in the last 168 hours. No results for input(s): "AMMONIA" in the last 168 hours. Coagulation Profile: No results for input(s): "INR", "PROTIME" in the last 168 hours. Cardiac Enzymes: No results for input(s): "CKTOTAL", "CKMB", "CKMBINDEX", "TROPONINI" in the last 168 hours. BNP (last 3 results) No results for input(s): "PROBNP" in the last 8760 hours. HbA1C: No results for input(s): "HGBA1C" in the last 72 hours. CBG: No results for input(s): "GLUCAP" in the last 168 hours. Lipid Profile: No results for input(s): "CHOL", "HDL", "LDLCALC", "TRIG", "CHOLHDL", "LDLDIRECT" in the last 72 hours. Thyroid Function Tests: No results for input(s): "TSH", "T4TOTAL", "FREET4", "T3FREE", "THYROIDAB" in the last 72 hours. Anemia Panel: No results for input(s): "VITAMINB12", "FOLATE", "FERRITIN", "TIBC", "IRON", "RETICCTPCT" in the last 72 hours. Sepsis Labs: No results for input(s): "PROCALCITON", "LATICACIDVEN" in the last 168 hours.  No results found for this or any previous visit (from the past 240 hour(s)).       Radiology Studies: No results found.      Scheduled Meds:  amLODipine  5 mg Oral Daily  ascorbic acid  500 mg Oral Daily   aspirin EC  81 mg Oral Daily   atorvastatin  10 mg Oral QHS   cyanocobalamin  1,000 mcg Oral Daily   enoxaparin (LOVENOX) injection  30 mg Subcutaneous Q24H   feeding supplement  1 Container Oral TID BM   HYDROcodone-acetaminophen  1 tablet Oral Q8H   iron polysaccharides  150 mg Oral Daily   levothyroxine  50 mcg Oral q morning   melatonin  5 mg Oral QHS   metoprolol tartrate  25 mg Oral BID   mometasone-formoterol  2 puff Inhalation BID   montelukast  10 mg Oral QHS   multivitamin with minerals  1 tablet Oral Daily   pantoprazole  80 mg Oral Daily   polyethylene glycol  17 g Oral BID   risperiDONE  0.25 mg Oral QHS   thiamine  100 mg Oral Daily   traZODone  300 mg Oral QHS    Continuous Infusions:   LOS: 10 days     Alison Moore, MD [facility]   If 7PM-7AM, please contact night-coverage  05/19/2023, 11:08 AM

## 2023-05-19 NOTE — Plan of Care (Signed)
  Problem: Activity: Goal: Ability to tolerate increased activity will improve Outcome: Progressing   Problem: Respiratory: Goal: Ability to maintain a clear airway will improve Outcome: Progressing Goal: Levels of oxygenation will improve Outcome: Progressing Goal: Ability to maintain adequate ventilation will improve Outcome: Progressing   Problem: Clinical Measurements: Goal: Respiratory complications will improve Outcome: Progressing   Problem: Coping: Goal: Level of anxiety will decrease Outcome: Progressing   Problem: Elimination: Goal: Will not experience complications related to bowel motility Outcome: Progressing   Problem: Pain Managment: Goal: General experience of comfort will improve Outcome: Progressing   Problem: Safety: Goal: Ability to remain free from injury will improve Outcome: Progressing

## 2023-05-20 DIAGNOSIS — J441 Chronic obstructive pulmonary disease with (acute) exacerbation: Secondary | ICD-10-CM | POA: Diagnosis not present

## 2023-05-20 NOTE — Plan of Care (Signed)
  Problem: Education: Goal: Knowledge of disease or condition will improve 05/20/2023 1458 by Suezanne Cheshire, RN Outcome: Progressing 05/20/2023 1457 by Suezanne Cheshire, RN Outcome: Progressing Goal: Knowledge of the prescribed therapeutic regimen will improve Outcome: Progressing Goal: Individualized Educational Video(s) Outcome: Progressing   Problem: Activity: Goal: Ability to tolerate increased activity will improve Outcome: Progressing Goal: Will verbalize the importance of balancing activity with adequate rest periods Outcome: Progressing   Problem: Respiratory: Goal: Ability to maintain a clear airway will improve Outcome: Progressing Goal: Levels of oxygenation will improve Outcome: Progressing Goal: Ability to maintain adequate ventilation will improve Outcome: Progressing   Problem: Education: Goal: Knowledge of General Education information will improve Description: Including pain rating scale, medication(s)/side effects and non-pharmacologic comfort measures Outcome: Progressing   Problem: Health Behavior/Discharge Planning: Goal: Ability to manage health-related needs will improve Outcome: Progressing   Problem: Clinical Measurements: Goal: Ability to maintain clinical measurements within normal limits will improve Outcome: Progressing Goal: Will remain free from infection Outcome: Progressing Goal: Diagnostic test results will improve Outcome: Progressing Goal: Respiratory complications will improve Outcome: Progressing Goal: Cardiovascular complication will be avoided Outcome: Progressing   Problem: Activity: Goal: Risk for activity intolerance will decrease Outcome: Progressing   Problem: Nutrition: Goal: Adequate nutrition will be maintained Outcome: Progressing   Problem: Coping: Goal: Level of anxiety will decrease Outcome: Progressing   Problem: Elimination: Goal: Will not experience complications related to bowel motility Outcome:  Progressing Goal: Will not experience complications related to urinary retention Outcome: Progressing   Problem: Pain Managment: Goal: General experience of comfort will improve Outcome: Progressing   Problem: Safety: Goal: Ability to remain free from injury will improve Outcome: Progressing   Problem: Skin Integrity: Goal: Risk for impaired skin integrity will decrease Outcome: Progressing

## 2023-05-20 NOTE — Plan of Care (Signed)
  Problem: Activity: Goal: Ability to tolerate increased activity will improve Outcome: Progressing   Problem: Respiratory: Goal: Ability to maintain adequate ventilation will improve Outcome: Progressing   Problem: Clinical Measurements: Goal: Respiratory complications will improve Outcome: Progressing Goal: Cardiovascular complication will be avoided Outcome: Progressing   Problem: Nutrition: Goal: Adequate nutrition will be maintained Outcome: Progressing   Problem: Coping: Goal: Level of anxiety will decrease Outcome: Progressing   Problem: Pain Managment: Goal: General experience of comfort will improve Outcome: Progressing

## 2023-05-20 NOTE — Progress Notes (Signed)
PROGRESS NOTE    [name]  QIO:[id number] DOB: 06/10/1946 DOA: 05/06/2023 PCP: [name], MD    Brief Narrative:   77 y.o. female with a PMH significant for anxiety, hypothyroid, advanced COPD, hyperlipidemia, hypertension.   [name] presented from home to the ED on 05/06/2023 with SOB x 2 days with increased phlegm production. She is a former tobacco user. At her peak she smoked 2 packs/day. She denies EtOH and recreational drug use. At baseline she is minimally ambulatory and lives alone but has daily caregivers.    In the ED, it was found that [name] had temperature 97.9, respiration rate of 19, heart rate 141, blood pressure 163/99, SpO2 96% on 2 L nasal cannula.  Significant findings included sodium is 143, potassium 4.0, chloride 107, bicarb 26, BUN of 8, serum creatinine of 0.53, eGFR greater than 60, nonfasting blood glucose 119, WBC 9.7, hemoglobin 12.3, platelets of 378.High sensitive troponin was 10.  Chest x-ray: Read as emphysematous changes without acute cardiopulmonary process.    [name] were initially treated with DuoNebs one-time treatment, Solu-Medrol 40 mg IV one-time dose, metoprolol 25 mg p.o. one-time dose, sodium chloride 1 L bolus.    Patient was admitted to medicine service for further workup and management of COPD exacerbation as outlined in detail below.   6/20-6/22 -stable, improved to her baseline respiratory status.    6/22: patient was evaluated by psych yesterday and determined to have capacity. Continues to remain hospitalized for unsafe dispo plan. Grandson to come in tomorrow to discuss placement in ALF or other facility.    6/23: patient evaluated by PT who recommends SNF. Patient is agreeable to SNF as long as it isn't [facility]. Her grandson visited and is in agreement with the plan as well and working on care for after SNF as well. TOC engaged for placement. Medically ready for dc when SNF placement available.    Assessment & Plan:   Principal  Problem:   COPD exacerbation (HCC) Active Problems:   Chronic pain syndrome   CAD (coronary artery disease)   Essential hypertension   Underweight   HLD (hyperlipidemia)   Hypothyroidism   Anxiety and depression   Protein-calorie malnutrition, severe (HCC)   PAD (peripheral artery disease) (HCC)   Acute on chronic respiratory failure with hypoxemia (HCC)   COPD (chronic obstructive pulmonary disease) (HCC)   Dementia with behavioral disturbance (HCC)  COPD exacerbation (HCC)- had increase in oxygen dependence from baseline 2.5L. Remains stable several days on home oxygen level. S/p Solu-Medrol 40 mg IV  Cough is improved and she feels at baseline.  - continue DuoNebs PRN - Maintain SpO2 greater than 88% -  azithromycin (completed 6/19) - continue dulera BID - completed steroid pack, no further steroids indicated - ambulate with pulse ox - add humidifier to oxygen for dryness treatment -Medically stable for discharge to skilled nursing facility   CAD  HLD  PAD- no chest pain. Stable.  - Atorvastatin 10 mg nightly resumed - norco for LE pain management. Not a surgical candidate, per patient. Does not tolerate gabapentin.   Essential hypertension- moderately well controlled Resumed home amlodipine 5 mg daily,  metoprolol tartrate 25 mg p.o. twice daily   Hypothyroidism Levothyroxine 50 mcg every morning resumed   Anxiety and depression- evaluated by psych this admission to assess capacity and determined to have capacity. She had episode of delusions but has not had an episode in several days since so maybe related to medications/hospital delirium.  Home trazodone  300 mg nightly resumed Home hydroxyzine 25 mg 3 times daily as needed for anxiety Added low dose xanax for breakthrough anxiety   Protein-calorie malnutrition, severe (HCC)  failure to thrive- Home Ensure between meals resumed  DVT prophylaxis: SQ Lovenox Code Status: Full Family Communication:  None Disposition Plan: Status is: Inpatient Remains inpatient appropriate because: Unsafe discharge plan.  Insurance authorization obtained.  Patient will discharge to skilled nursing facility Monday 7/1   Level of care: Med-Surg  Consultants:  None  Procedures:  None  Antimicrobials: None   Subjective: Seen and examined.  Resting in bed.  No visible distress Objective: Vitals:   05/19/23 0003 05/19/23 0739 05/19/23 1523 05/19/23 2109  BP: 118/62 (!) 108/54 (!) 92/51 (!) 111/58  Pulse: 79 68 73 83  Resp: 18 19 20 18   Temp: 98.6 F (37 C) (!) 97.5 F (36.4 C) 98 F (36.7 C) 99 F (37.2 C)  TempSrc:      SpO2: 98% 100% 97% 100%  Weight:        Intake/Output Summary (Last 24 hours) at 05/20/2023 0911 Last data filed at 05/20/2023 0909 Gross per 24 hour  Intake 120 ml  Output 1400 ml  Net -1280 ml   Filed Weights   05/07/23 0015  Weight: 40.4 kg    Examination:  General exam: NAD.  Appears frail Respiratory system: Bibasilar crackles.  Normal work of breathing.  3 L Cardiovascular system: S1-S2, RRR, no murmurs, no pedal edema Gastrointestinal system: Soft, NT/ND, no bowel sounds Central nervous system: Alert and oriented. No focal neurological deficits. Extremities: Symmetric 3 x 5 power. Skin: No rashes, lesions or ulcers Psychiatry: Judgement and insight appear normal. Mood & affect anxious.     Data Reviewed: I have personally reviewed following labs and imaging studies  CBC: Recent Labs  Lab 05/14/23 0530  WBC 8.5  HGB 11.1*  HCT 36.0  MCV 94.2  PLT 234   Basic Metabolic Panel: Recent Labs  Lab 05/14/23 0530  NA 141  K 4.0  CL 106  CO2 26  GLUCOSE 102*  BUN 16  CREATININE 0.60  CALCIUM 8.7*   GFR: Estimated Creatinine Clearance: 38.2 mL/min (by C-G formula based on SCr of 0.6 mg/dL). Liver Function Tests: No results for input(s): "AST", "ALT", "ALKPHOS", "BILITOT", "PROT", "ALBUMIN" in the last 168 hours. No results for input(s):  "LIPASE", "AMYLASE" in the last 168 hours. No results for input(s): "AMMONIA" in the last 168 hours. Coagulation Profile: No results for input(s): "INR", "PROTIME" in the last 168 hours. Cardiac Enzymes: No results for input(s): "CKTOTAL", "CKMB", "CKMBINDEX", "TROPONINI" in the last 168 hours. BNP (last 3 results) No results for input(s): "PROBNP" in the last 8760 hours. HbA1C: No results for input(s): "HGBA1C" in the last 72 hours. CBG: No results for input(s): "GLUCAP" in the last 168 hours. Lipid Profile: No results for input(s): "CHOL", "HDL", "LDLCALC", "TRIG", "CHOLHDL", "LDLDIRECT" in the last 72 hours. Thyroid Function Tests: No results for input(s): "TSH", "T4TOTAL", "FREET4", "T3FREE", "THYROIDAB" in the last 72 hours. Anemia Panel: No results for input(s): "VITAMINB12", "FOLATE", "FERRITIN", "TIBC", "IRON", "RETICCTPCT" in the last 72 hours. Sepsis Labs: No results for input(s): "PROCALCITON", "LATICACIDVEN" in the last 168 hours.  No results found for this or any previous visit (from the past 240 hour(s)).       Radiology Studies: No results found.      Scheduled Meds:  amLODipine  5 mg Oral Daily   ascorbic acid  500 mg Oral Daily  aspirin EC  81 mg Oral Daily   atorvastatin  10 mg Oral QHS   cyanocobalamin  1,000 mcg Oral Daily   enoxaparin (LOVENOX) injection  30 mg Subcutaneous Q24H   feeding supplement  1 Container Oral TID BM   HYDROcodone-acetaminophen  1 tablet Oral Q8H   iron polysaccharides  150 mg Oral Daily   levothyroxine  50 mcg Oral q morning   melatonin  5 mg Oral QHS   metoprolol tartrate  25 mg Oral BID   mometasone-formoterol  2 puff Inhalation BID   montelukast  10 mg Oral QHS   multivitamin with minerals  1 tablet Oral Daily   pantoprazole  80 mg Oral Daily   polyethylene glycol  17 g Oral BID   risperiDONE  0.25 mg Oral QHS   thiamine  100 mg Oral Daily   traZODone  300 mg Oral QHS   Continuous Infusions:   LOS: 11 days      Tresa Moore, MD [facility]   If 7PM-7AM, please contact night-coverage  05/20/2023, 9:11 AM

## 2023-05-21 DIAGNOSIS — J441 Chronic obstructive pulmonary disease with (acute) exacerbation: Secondary | ICD-10-CM | POA: Diagnosis not present

## 2023-05-21 MED ORDER — HYDROCODONE-ACETAMINOPHEN 7.5-325 MG PO TABS: 1.0000 | ORAL_TABLET | ORAL | Status: DC | PRN

## 2023-05-21 MED ORDER — HYDROCODONE-ACETAMINOPHEN 7.5-325 MG PO TABS: 1.0000 | ORAL_TABLET | ORAL | 0 refills | Status: AC | PRN

## 2023-05-21 MED ORDER — SODIUM CHLORIDE 0.9 % IV BOLUS
500.0000 mL | Freq: Once | INTRAVENOUS | Status: AC
  Administered 2023-05-21: 500 mL via INTRAVENOUS

## 2023-05-21 MED ORDER — MORPHINE SULFATE (PF) 2 MG/ML IV SOLN
2.0000 mg | Freq: Once | INTRAVENOUS | Status: AC
  Administered 2023-05-21: 2 mg via INTRAVENOUS
  Filled 2023-05-21: qty 1

## 2023-05-21 NOTE — Plan of Care (Signed)
  Problem: Respiratory: Goal: Ability to maintain a clear airway will improve Outcome: Progressing Goal: Levels of oxygenation will improve Outcome: Progressing   Problem: Health Behavior/Discharge Planning: Goal: Ability to manage health-related needs will improve Outcome: Progressing   Problem: Clinical Measurements: Goal: Diagnostic test results will improve Outcome: Progressing Goal: Respiratory complications will improve Outcome: Progressing Goal: Cardiovascular complication will be avoided Outcome: Progressing   Problem: Nutrition: Goal: Adequate nutrition will be maintained Outcome: Progressing   Problem: Coping: Goal: Level of anxiety will decrease Outcome: Progressing   Problem: Pain Managment: Goal: General experience of comfort will improve Outcome: Progressing

## 2023-05-21 NOTE — Progress Notes (Signed)
Report called to [facility] the nurse for this patient going to room 610 at [facility]. Pt left with all of her belongings, home keys, and money.  Her vital signs were taken and recorded as TPR  98.70F, 94, 18 and BP 124/75 (91).  Patient is alert and oriented x 4. Pain medication and medication for anxiety were given prior to discharge.

## 2023-05-21 NOTE — TOC Transition Note (Signed)
Transition of Care [facility]) - CM/SW Discharge Note   Patient Details  Name: [name] MRN: [id number] Date of Birth: October 01, 1946  Transition of Care [facility]) CM/SW Contact:  Garret Reddish, RN Phone Number: 05/21/2023, 12:07 PM   Clinical Narrative:   Chart reviewed.  Patient has received SNF approval.   Patient has been approved for SNF from 05-18-2023-05-30-2023.  Next review date will be 7/11.  Certification number is [id number].    I have informed Tiffany with [facility] of the above information.  Tiffany reports that patient will be going to room 610 and the number to call report is [phone].  I have sent Tiffany patient's Discharge summary, Discharge orders and SNF transfer Report.    I have left a voice message for patient's grandson Tiffaney Brossett that patient will be discharging to [facility] today.    I have arranged EMS transport via [facility] today.    I have informed staff nurse of the above information.      Final next level of care: Skilled Nursing Facility Barriers to Discharge: No Barriers Identified   Patient Goals and CMS Choice CMS Medicare.gov Compare Post Acute Care list provided to:: Patient Choice offered to / list presented to : Patient  Discharge Placement                Patient chooses bed at: [facility] Patient to be transferred to facility by: [facility] Name of family member notified: Murlean Caller Patient and family notified of of transfer: 05/21/23  Discharge Plan and Services Additional resources added to the After Visit Summary for                                       Social Determinants of Health (SDOH) Interventions SDOH Screenings   Food Insecurity: No Food Insecurity (05/07/2023)  Housing: Low Risk  (05/07/2023)  Transportation Needs: No Transportation Needs (05/07/2023)  Utilities: Not At Risk (05/07/2023)  Depression (PHQ2-9): Low Risk  (07/13/2021)  Tobacco Use: High Risk  (05/06/2023)     Readmission Risk Interventions    01/31/2023   11:49 AM 07/05/2022   10:50 AM 11/25/2021    9:50 AM  Readmission Risk Prevention Plan  Transportation Screening Complete Complete Complete  Medication Review Oceanographer) Complete Complete Complete  PCP or Specialist appointment within 3-5 days of discharge Complete  Complete  HRI or Home Care Consult  Complete Complete  SW Recovery Care/Counseling Consult Complete Complete   Palliative Care Screening Not Applicable  Not Applicable  Skilled Nursing Facility Not Applicable Not Applicable Not Applicable

## 2023-05-21 NOTE — Care Management Important Message (Signed)
Important Message  Patient Details  Name: [name] MRN: [id number] Date of Birth: 23-Jun-1946   Medicare Important Message Given:  Yes     Olegario Messier A Raybon Conard 05/21/2023, 10:37 AM

## 2023-06-13 ENCOUNTER — Telehealth: Payer: Self-pay | Admitting: Podiatry

## 2023-06-13 ENCOUNTER — Telehealth: Payer: Self-pay

## 2023-06-13 NOTE — Telephone Encounter (Signed)
Pt states that she need to talk with the nurse  about the surgery next Friday pt has been calling all week

## 2023-06-13 NOTE — Telephone Encounter (Signed)
Patient called again this morning, questions about surgery. Please call to discuss Thanks  [phone]

## 2023-06-13 NOTE — Telephone Encounter (Signed)
Pt called again asking if Dr Allena Katz can call her, Pt has been trying to reach the nurse and she has questions regarding her Sx next week.  [phone]  Please advise

## 2023-06-13 NOTE — Telephone Encounter (Signed)
Spoke with PT in regards to surgery. PT states that she needs to have surgery with Dr Allena Katz and was advised to call to schedule. Advised patient that she would need to see Dr Allena Katz prior to having surgery so that she can have a consultation with him and sign surgery consent. She stated that she did not have a ride to be able to come to an appointment. I advised the patient that when she is able to obtain transportation to call and schedule an appointment.

## 2023-07-03 ENCOUNTER — Ambulatory Visit: Payer: Medicare HMO | Admitting: Podiatry

## 2023-07-03 DIAGNOSIS — Z89422 Acquired absence of other left toe(s): Secondary | ICD-10-CM | POA: Diagnosis not present

## 2023-07-03 DIAGNOSIS — I739 Peripheral vascular disease, unspecified: Secondary | ICD-10-CM

## 2023-07-03 MED ORDER — OXYCODONE-ACETAMINOPHEN 10-325 MG PO TABS: 1.0000 | ORAL_TABLET | ORAL | 0 refills | Status: DC | PRN

## 2023-07-03 NOTE — Progress Notes (Signed)
Subjective:  Patient ID: [name], female    DOB: [birth year],  MRN: [id number]  Chief Complaint  Patient presents with   Toe Pain    Right great toe pain     77 y.o. female presents with the above complaint.  Patient presents with right great toe pain.  Patient has a superficial breakdown of the great toe skin.  She states that she started hurting as has progressive gotten worse she has not seen anyone else prior to seeing me for this.  She is known to Dr. Debroah Loop for vascular surgery she has history of intervention to the leg.  She has history of amputation   Review of Systems: Negative except as noted in the HPI. Denies N/V/F/Ch.  Past Medical History:  Diagnosis Date   Anxiety    CAD (coronary artery disease)    Carpal tunnel syndrome    Cataract    Complication of anesthesia    has woken up at times   COPD (chronic obstructive pulmonary disease) (HCC)    CRP elevated 09/14/2015   Depression    Difficult intubation    Dyslipidemia    Dyspnea    DOE   Dysrhythmia    hx palpatations   Elevated sedimentation rate 09/14/2015   Esophageal spasm    Gastrointestinal parasites    GERD (gastroesophageal reflux disease)    HCAP (healthcare-associated pneumonia) 10/22/2018   Hiatal hernia    History of peptic ulcer disease    Hypertension    Hyperthyroidism    Hypothyroidism    Low magnesium levels 09/14/2015   Pelvic fracture (HCC) 2008   fall from riding a horse   Peripheral vascular disease (HCC)    Reflux    Rotator cuff injury    Sepsis (HCC) 10/22/2018   Stenosis, spinal, lumbar     Current Outpatient Medications:    oxyCODONE-acetaminophen (PERCOCET) 10-325 MG tablet, Take 1 tablet by mouth every 4 (four) hours as needed for pain., Disp: 30 tablet, Rfl: 0   albuterol (VENTOLIN HFA) 108 (90 Base) MCG/ACT inhaler, Inhale 1-2 puffs into the lungs every 4 (four) hours as needed for shortness of breath or wheezing., Disp: , Rfl:    amLODipine (NORVASC) 5 MG tablet,  Take 1 tablet (5 mg total) by mouth daily., Disp: 30 tablet, Rfl: 1   ammonium lactate (LAC-HYDRIN) 12 % lotion, APPLY TO AFFECTED AREA AS NEEDED FOR DRY SKIN, Disp: 400 mL, Rfl: 1   ASPIRIN LOW DOSE 81 MG tablet, TAKE 1 TABLET BY MOUTH EVERY DAY, Disp: 90 tablet, Rfl: 5   atorvastatin (LIPITOR) 10 MG tablet, Take 1 tablet (10 mg total) by mouth daily. (Patient not taking: Reported on 05/06/2023), Disp: 30 tablet, Rfl: 11   benzonatate (TESSALON) 100 MG capsule, Take 1 capsule (100 mg total) by mouth 3 (three) times daily as needed for cough., Disp: 20 capsule, Rfl: 0   feeding supplement (ENSURE ENLIVE / ENSURE PLUS) LIQD, Take 237 mLs by mouth 3 (three) times daily between meals., Disp: 237 mL, Rfl: 12   fluticasone-salmeterol (ADVAIR) 250-50 MCG/ACT AEPB, Inhale 1 puff into the lungs 2 (two) times daily as needed., Disp: , Rfl:    hydrOXYzine (ATARAX) 25 MG tablet, Take 1 tablet (25 mg total) by mouth 3 (three) times daily as needed for anxiety., Disp: 30 tablet, Rfl: 0   ipratropium-albuterol (DUONEB) 0.5-2.5 (3) MG/3ML SOLN, Take 3 mLs by nebulization every 6 (six) hours as needed., Disp: , Rfl:    iron polysaccharides (NIFEREX) 150  MG capsule, Take 1 capsule (150 mg total) by mouth daily., Disp: 90 capsule, Rfl: 0   levothyroxine (SYNTHROID) 50 MCG tablet, Take 50 mcg by mouth every morning., Disp: , Rfl:    melatonin 5 MG TABS, Take 1 tablet (5 mg total) by mouth at bedtime., Disp: 30 tablet, Rfl: 11   metoprolol tartrate (LOPRESSOR) 25 MG tablet, Take 1 tablet (25 mg total) by mouth 2 (two) times daily. Skip the dose if systolic BP less than 130 mmHg and/or heart rate less than 65 beats per minute, Disp: 60 tablet, Rfl: 5   Multiple Vitamin (MULTIVITAMIN WITH MINERALS) TABS tablet, Take 1 tablet by mouth daily., Disp: 90 tablet, Rfl: 1   omeprazole (PRILOSEC) 40 MG capsule, Take 40 mg by mouth daily., Disp: , Rfl:    polyethylene glycol (MIRALAX / GLYCOLAX) 17 g packet, Take 17 g by mouth daily  as needed., Disp: 14 each, Rfl: 0   potassium chloride SA (KLOR-CON M) 20 MEQ tablet, Take 20 mEq by mouth daily., Disp: , Rfl:    risperiDONE (RISPERDAL) 0.25 MG tablet, Take 1 tablet (0.25 mg total) by mouth at bedtime., Disp: 30 tablet, Rfl: 2   thiamine (VITAMIN B-1) 100 MG tablet, Take 1 tablet (100 mg total) by mouth daily., Disp: 90 tablet, Rfl: 1   traZODone (DESYREL) 150 MG tablet, Take 300 mg by mouth at bedtime., Disp: , Rfl:   Social History   Tobacco Use  Smoking Status Some Days   Types: Cigarettes  Smokeless Tobacco Never  Tobacco Comments   pt states 2 cigs per month     No Known Allergies Objective:  There were no vitals filed for this visit. There is no height or weight on file to calculate BMI. Constitutional Well developed. Well nourished.  Vascular Dorsalis pedis pulses nonpalpable bilaterally. Posterior tibial pulses palpable bilaterally. Capillary refill decrease to all digits.  No cyanosis or clubbing noted. Pedal hair growth normal.  Neurologic Normal speech. Oriented to person, place, and time. Epicritic sensation to light touch grossly present bilaterally.  Dermatologic Right great toe superficial breakdown of the dorsal PIPJ joint skin.  Does not probe down to bone no purulent drainage noted.  Orthopedic: Normal joint ROM without pain or crepitus bilaterally. No visible deformities. No bony tenderness.   Radiographs: None Assessment:   1. PAD (peripheral artery disease) (HCC)   2. History of amputation of lesser toe of left foot (HCC)   3. Claudication (HCC)    Plan:  Patient was evaluated and treated and all questions answered.  Right claudication/rest pain -All questions and concerns were discussed with the patient in extensive detail given the amount of pain that she is experiencing she will benefit from evaluation by vascular surgery.  Her foot does feel little bit cool to touch likely due to diminished flow to the right lower extremity.   She is experiencing claudication/rest pain symptoms. -Referral to vascular surgery placed  No follow-ups on file.

## 2023-07-05 ENCOUNTER — Other Ambulatory Visit: Payer: Self-pay

## 2023-07-05 ENCOUNTER — Emergency Department: Payer: Medicare HMO

## 2023-07-05 ENCOUNTER — Emergency Department
Admission: EM | Admit: 2023-07-05 | Discharge: 2023-07-05 | Disposition: A | Discharge status: Home / Self Care | Payer: Medicare HMO | Attending: Emergency Medicine | Admitting: Emergency Medicine

## 2023-07-05 DIAGNOSIS — Z20822 Contact with and (suspected) exposure to covid-19: Secondary | ICD-10-CM | POA: Insufficient documentation

## 2023-07-05 DIAGNOSIS — R0682 Tachypnea, not elsewhere classified: Secondary | ICD-10-CM | POA: Diagnosis not present

## 2023-07-05 DIAGNOSIS — J449 Chronic obstructive pulmonary disease, unspecified: Secondary | ICD-10-CM | POA: Diagnosis not present

## 2023-07-05 DIAGNOSIS — R079 Chest pain, unspecified: Secondary | ICD-10-CM | POA: Insufficient documentation

## 2023-07-05 DIAGNOSIS — R531 Weakness: Secondary | ICD-10-CM | POA: Insufficient documentation

## 2023-07-05 DIAGNOSIS — R7989 Other specified abnormal findings of blood chemistry: Secondary | ICD-10-CM | POA: Diagnosis not present

## 2023-07-05 DIAGNOSIS — I10 Essential (primary) hypertension: Secondary | ICD-10-CM | POA: Insufficient documentation

## 2023-07-05 DIAGNOSIS — R410 Disorientation, unspecified: Secondary | ICD-10-CM | POA: Diagnosis present

## 2023-07-05 DIAGNOSIS — R5383 Other fatigue: Secondary | ICD-10-CM

## 2023-07-05 LAB — CBC WITH DIFFERENTIAL/PLATELET
Abs Immature Granulocytes: 0.03 10*3/uL (ref 0.00–0.07)
Basophils Absolute: 0.1 10*3/uL (ref 0.0–0.1)
Basophils Relative: 1 %
Eosinophils Absolute: 0.7 10*3/uL — ABNORMAL HIGH (ref 0.0–0.5)
Eosinophils Relative: 6 %
HCT: 37.2 % (ref 36.0–46.0)
Hemoglobin: 11.4 g/dL — ABNORMAL LOW (ref 12.0–15.0)
Immature Granulocytes: 0 %
Lymphocytes Relative: 25 %
Lymphs Abs: 2.6 10*3/uL (ref 0.7–4.0)
MCH: 29.3 pg (ref 26.0–34.0)
MCHC: 30.6 g/dL (ref 30.0–36.0)
MCV: 95.6 fL (ref 80.0–100.0)
Monocytes Absolute: 0.9 10*3/uL (ref 0.1–1.0)
Monocytes Relative: 8 %
Neutro Abs: 6.3 10*3/uL (ref 1.7–7.7)
Neutrophils Relative %: 60 %
Platelets: 350 10*3/uL (ref 150–400)
RBC: 3.89 MIL/uL (ref 3.87–5.11)
RDW: 13.7 % (ref 11.5–15.5)
WBC: 10.5 10*3/uL (ref 4.0–10.5)
nRBC: 0 % (ref 0.0–0.2)

## 2023-07-05 LAB — COMPREHENSIVE METABOLIC PANEL
ALT: 54 U/L — ABNORMAL HIGH (ref 0–44)
AST: 43 U/L — ABNORMAL HIGH (ref 15–41)
Albumin: 3 g/dL — ABNORMAL LOW (ref 3.5–5.0)
Alkaline Phosphatase: 136 U/L — ABNORMAL HIGH (ref 38–126)
Anion gap: 11 (ref 5–15)
BUN: 12 mg/dL (ref 8–23)
CO2: 25 mmol/L (ref 22–32)
Calcium: 8.4 mg/dL — ABNORMAL LOW (ref 8.9–10.3)
Chloride: 103 mmol/L (ref 98–111)
Creatinine, Ser: 0.59 mg/dL (ref 0.44–1.00)
GFR, Estimated: >60 mL/min (ref >60)
Glucose, Bld: 89 mg/dL (ref 70–99)
Potassium: 3.2 mmol/L — ABNORMAL LOW (ref 3.5–5.1)
Sodium: 139 mmol/L (ref 135–145)
Total Bilirubin: 0.6 mg/dL (ref 0.3–1.2)
Total Protein: 6.1 g/dL — ABNORMAL LOW (ref 6.5–8.1)

## 2023-07-05 LAB — URINALYSIS, W/ REFLEX TO CULTURE (INFECTION SUSPECTED)
Bacteria, UA: NONE SEEN
Bilirubin Urine: NEGATIVE
Glucose, UA: NEGATIVE mg/dL
Hgb urine dipstick: NEGATIVE
Ketones, ur: NEGATIVE mg/dL
Leukocytes,Ua: NEGATIVE
Nitrite: NEGATIVE
Protein, ur: NEGATIVE mg/dL
Specific Gravity, Urine: 1.021 (ref 1.005–1.030)
pH: 5 (ref 5.0–8.0)

## 2023-07-05 LAB — PROTIME-INR
INR: 1 (ref 0.8–1.2)
Prothrombin Time: 13 seconds (ref 11.4–15.2)

## 2023-07-05 LAB — RESP PANEL BY RT-PCR (RSV, FLU A&B, COVID)  RVPGX2
Influenza A by PCR: NEGATIVE
Influenza B by PCR: NEGATIVE
Resp Syncytial Virus by PCR: NEGATIVE
SARS Coronavirus 2 by RT PCR: NEGATIVE

## 2023-07-05 LAB — LACTIC ACID, PLASMA: Lactic Acid, Venous: 1.7 mmol/L (ref 0.5–1.9)

## 2023-07-05 MED ORDER — SODIUM CHLORIDE 0.9 % IV BOLUS
1000.0000 mL | Freq: Once | INTRAVENOUS | Status: AC
  Administered 2023-07-05: 1000 mL via INTRAVENOUS

## 2023-07-05 MED ORDER — METOPROLOL TARTRATE 5 MG/5ML IV SOLN
2.5000 mg | Freq: Once | INTRAVENOUS | Status: AC
  Administered 2023-07-05: 2.5 mg via INTRAVENOUS
  Filled 2023-07-05: qty 5

## 2023-07-05 MED ORDER — METOPROLOL TARTRATE 25 MG PO TABS
25.0000 mg | ORAL_TABLET | Freq: Once | ORAL | Status: AC
  Administered 2023-07-05: 25 mg via ORAL
  Filled 2023-07-05: qty 1

## 2023-07-05 NOTE — ED Notes (Signed)
ERP notified pt would like to go home, see orders

## 2023-07-05 NOTE — ED Notes (Signed)
Pt son coming to pick her up. First nurse notified of pt ride. PT verbalized understanding of DC papers

## 2023-07-05 NOTE — ED Notes (Signed)
Pt yelling out wanting to leave, ERP notified. PT updated we are waiting on her ultrasound results and will go from there.

## 2023-07-05 NOTE — ED Notes (Signed)
EDT only able to obtain green and purple top. Primary RN made aware.

## 2023-07-05 NOTE — Discharge Instructions (Signed)
As we discussed please drink plenty of fluids obtain plenty of rest.  Please follow-up with your doctor in the next 1 to 2 days for recheck/reevaluation.  Return to the emergency department for any symptom personally concerning to yourself.

## 2023-07-05 NOTE — ED Provider Notes (Signed)
[facility] Provider Note    Event Date/Time   First MD Initiated Contact with Patient 07/05/23 1132     (approximate)  History   Chief Complaint: Altered Mental Status  HPI  [name] is a 77 y.o. female with a past medical history of anxiety, COPD on 2.5 L 24/7, gastric reflux, hypertension, presents to the emergency department for chest pain/confusion.  According to EMS they were called to the patient's home by family member concern for worsening confusion.  They state patient appears more confused than typical.  EMS states patient found to be tachycardic and tachypneic meeting sepsis criteria the patient was brought to the emergency department for further evaluation.  Here the patient is currently satting in the low 90s on 2.5 L which is her baseline O2 requirement.  She is hard of hearing but is awake and alert.  She is answering most questions appropriately.  Does complain of some pain in her chest.  Patient appears to have chronic pain per record review.  Physical Exam   Triage Vital Signs: ED Triage Vitals  Encounter Vitals Group     BP 07/05/23 1122 126/72     Systolic BP Percentile --      Diastolic BP Percentile --      Pulse Rate 07/05/23 1127 (!) 120     Resp 07/05/23 1122 (!) 24     Temp 07/05/23 1127 98.5 F (36.9 C)     Temp Source 07/05/23 1127 Oral     SpO2 07/05/23 1122 90 %     Weight --      Height --      Head Circumference --      Peak Flow --      Pain Score 07/05/23 1125 0     Pain Loc --      Pain Education --      Exclude from Growth Chart --     Most recent vital signs: Vitals:   07/05/23 1122 07/05/23 1127  BP: 126/72   Pulse:  (!) 120  Resp: (!) 24   Temp:  98.5 F (36.9 C)  SpO2: 90%     General: Awake, no distress.  CV:  Good peripheral perfusion.  Regular rate and rhythm  Resp:  Normal effort.  Equal breath sounds bilaterally.  Abd:  No distention.  Soft, nontender.  No rebound or guarding.  ED  Results / Procedures / Treatments   EKG  EKG viewed and interpreted by myself shows sinus tachycardia at 105 bpm with a narrow QRS, normal axis, normal intervals, nonspecific ST changes.  RADIOLOGY  I have reviewed and interpreted chest x-ray images.  No obvious consolidation on my evaluation. Radiology is read the x-ray as mild opacity at the right lung base.   MEDICATIONS ORDERED IN ED: Medications  sodium chloride 0.9 % bolus 1,000 mL (has no administration in time range)     IMPRESSION / MDM / ASSESSMENT AND PLAN / ED COURSE  I reviewed the triage vital signs and the nursing notes.  Patient's presentation is most consistent with acute presentation with potential threat to life or bodily function.  Patient presents to the emergency department for concerns for confusion.  Patient is currently awake and alert.  Answers most questions appropriately.  She is mildly tachypneic around 24 breaths/min currently satting in the low 90s on 2.5 L which is her baseline O2 requirement.  Patient is somewhat tachycardic around 120 bpm.  We will check labs,  cultures, lactic acid we will obtain a urine sample, COVID/flu test.  We will IV hydrate and continue to closely monitor while awaiting further results.  Given no fever and no infectious symptoms we will hold off on antibiotics at the time until further results are known.  Patient CBC is reassuring with a normal white blood cell count.  Patient's chemistry reassuring besides mild LFT elevation, which appears to be new compared to baseline.  Will obtain a right upper quadrant ultrasound to further evaluate.  X-ray shows possible mild opacity in the right lung base.  Patient's CBC is reassuring with a normal white blood cell count, chemistry shows mild LFT elevation otherwise reassuring.  Patient's urinalysis shows no sign of infection lactic acid is normal at 1.7 the patient's COVID/flu/RSV is negative.  Patient is asking to be discharged home and is  adamant about going home.  Patient remains tachycardic currently around 115 bpm.  Patient states she has been out of her metoprolol and has not been taking it recently.  Patient's tachycardia could be related to this.  Awaiting right upper quadrant ultrasound results.  We will dose the patient's metoprolol and continue to closely monitor.  Patient agreeable to plan.  Patient's ultrasound shows prior cholecystectomy stable biliary dilation.  Patient is feeling much better.  Heart rate currently around 90 bpm during my evaluation.  Discussed with the patient she needs to start taking her metoprolol is taken.  Patient agreeable to plan.  Patient adamantly wishes to go home.  Given the patient's reassuring workup I believe the patient will be safe for discharge home with PCP follow-up.  Discussed with the patient to drink plenty fluids and obtain plenty of rest.  Patient agreeable to plan of care.  FINAL CLINICAL IMPRESSION(S) / ED DIAGNOSES   Weakness   Note:  This document was prepared using Dragon voice recognition software and may include unintentional dictation errors.   Minna Antis, MD 07/05/23 [phone]

## 2023-07-05 NOTE — ED Triage Notes (Signed)
First Nurse Note;  Pt via EMS from home. Pt c/o sick person, altered, and bilateral infections in her feet. Pt is alert but more altered than normal. States that pt meets SIRS criteria. EMS gave 1 Albuterol and 125mg  of Solu-Medrol.  130 HR  27 RR  90% on 2L (chronically) 18G L AC

## 2023-07-05 NOTE — ED Triage Notes (Addendum)
Pt to ED via [facility] from home. EMS reports called oput for sick person. Per EMS pt altered and has bilateral wounds on her feet. Pt meets SIR criteria with EMS. Pt alert to self and place.   EMS VS:  HR 130 90% on 2L [place]  18g LAC RR 27

## 2023-07-10 LAB — CULTURE, BLOOD (ROUTINE X 2)
Culture: NO GROWTH
Culture: NO GROWTH
Special Requests: ADEQUATE
Special Requests: ADEQUATE

## 2023-07-13 ENCOUNTER — Ambulatory Visit: Payer: Medicare HMO | Admitting: Podiatry

## 2023-08-09 ENCOUNTER — Other Ambulatory Visit: Payer: Self-pay | Admitting: Podiatry

## 2023-09-03 ENCOUNTER — Ambulatory Visit: Payer: Self-pay | Admitting: Urology

## 2023-09-05 ENCOUNTER — Encounter (INDEPENDENT_AMBULATORY_CARE_PROVIDER_SITE_OTHER): Payer: Self-pay | Admitting: Vascular Surgery

## 2023-09-05 NOTE — Telephone Encounter (Signed)
Let's get patient in for ABIs to check circulation but let them know to contact podiatrist for treatment of the wound

## 2023-09-05 NOTE — Telephone Encounter (Signed)
Patient has appointment scheduled for 09/25/2023 and verbalized understanding to medical recommendations

## 2023-09-21 ENCOUNTER — Other Ambulatory Visit (INDEPENDENT_AMBULATORY_CARE_PROVIDER_SITE_OTHER): Payer: Self-pay | Admitting: Vascular Surgery

## 2023-09-21 DIAGNOSIS — Z9889 Other specified postprocedural states: Secondary | ICD-10-CM

## 2023-09-25 ENCOUNTER — Encounter (INDEPENDENT_AMBULATORY_CARE_PROVIDER_SITE_OTHER): Payer: Medicare HMO | Admitting: Vascular Surgery

## 2023-09-25 ENCOUNTER — Encounter (INDEPENDENT_AMBULATORY_CARE_PROVIDER_SITE_OTHER): Payer: Medicare HMO

## 2023-10-01 ENCOUNTER — Observation Stay: Payer: Medicare HMO

## 2023-10-01 ENCOUNTER — Inpatient Hospital Stay
Admission: EM | Admit: 2023-10-01 | Discharge: 2023-10-08 | DRG: 190 | Disposition: A | Discharge status: Skilled Nursing Facility | Payer: Medicare HMO | Attending: Internal Medicine | Admitting: Internal Medicine

## 2023-10-01 ENCOUNTER — Emergency Department: Payer: Medicare HMO

## 2023-10-01 ENCOUNTER — Encounter: Payer: Self-pay | Admitting: Emergency Medicine

## 2023-10-01 ENCOUNTER — Other Ambulatory Visit: Payer: Self-pay

## 2023-10-01 DIAGNOSIS — J9622 Acute and chronic respiratory failure with hypercapnia: Secondary | ICD-10-CM | POA: Diagnosis present

## 2023-10-01 DIAGNOSIS — Z79890 Hormone replacement therapy (postmenopausal): Secondary | ICD-10-CM

## 2023-10-01 DIAGNOSIS — Z9842 Cataract extraction status, left eye: Secondary | ICD-10-CM

## 2023-10-01 DIAGNOSIS — K219 Gastro-esophageal reflux disease without esophagitis: Secondary | ICD-10-CM | POA: Diagnosis present

## 2023-10-01 DIAGNOSIS — J441 Chronic obstructive pulmonary disease with (acute) exacerbation: Secondary | ICD-10-CM | POA: Diagnosis not present

## 2023-10-01 DIAGNOSIS — I5032 Chronic diastolic (congestive) heart failure: Secondary | ICD-10-CM | POA: Diagnosis present

## 2023-10-01 DIAGNOSIS — Z9981 Dependence on supplemental oxygen: Secondary | ICD-10-CM

## 2023-10-01 DIAGNOSIS — E785 Hyperlipidemia, unspecified: Secondary | ICD-10-CM | POA: Diagnosis present

## 2023-10-01 DIAGNOSIS — R636 Underweight: Secondary | ICD-10-CM | POA: Diagnosis present

## 2023-10-01 DIAGNOSIS — Z9841 Cataract extraction status, right eye: Secondary | ICD-10-CM

## 2023-10-01 DIAGNOSIS — Z981 Arthrodesis status: Secondary | ICD-10-CM

## 2023-10-01 DIAGNOSIS — D72829 Elevated white blood cell count, unspecified: Secondary | ICD-10-CM | POA: Diagnosis present

## 2023-10-01 DIAGNOSIS — Z961 Presence of intraocular lens: Secondary | ICD-10-CM | POA: Diagnosis present

## 2023-10-01 DIAGNOSIS — R7989 Other specified abnormal findings of blood chemistry: Secondary | ICD-10-CM | POA: Diagnosis present

## 2023-10-01 DIAGNOSIS — Z79899 Other long term (current) drug therapy: Secondary | ICD-10-CM

## 2023-10-01 DIAGNOSIS — F0393 Unspecified dementia, unspecified severity, with mood disturbance: Secondary | ICD-10-CM | POA: Diagnosis present

## 2023-10-01 DIAGNOSIS — R918 Other nonspecific abnormal finding of lung field: Secondary | ICD-10-CM | POA: Diagnosis present

## 2023-10-01 DIAGNOSIS — F411 Generalized anxiety disorder: Secondary | ICD-10-CM | POA: Diagnosis present

## 2023-10-01 DIAGNOSIS — F1190 Opioid use, unspecified, uncomplicated: Secondary | ICD-10-CM | POA: Diagnosis present

## 2023-10-01 DIAGNOSIS — R0602 Shortness of breath: Secondary | ICD-10-CM | POA: Diagnosis not present

## 2023-10-01 DIAGNOSIS — Z7982 Long term (current) use of aspirin: Secondary | ICD-10-CM

## 2023-10-01 DIAGNOSIS — I70268 Atherosclerosis of native arteries of extremities with gangrene, other extremity: Secondary | ICD-10-CM | POA: Diagnosis present

## 2023-10-01 DIAGNOSIS — I10 Essential (primary) hypertension: Secondary | ICD-10-CM | POA: Diagnosis present

## 2023-10-01 DIAGNOSIS — Z66 Do not resuscitate: Secondary | ICD-10-CM | POA: Diagnosis present

## 2023-10-01 DIAGNOSIS — J189 Pneumonia, unspecified organism: Secondary | ICD-10-CM

## 2023-10-01 DIAGNOSIS — Z1152 Encounter for screening for COVID-19: Secondary | ICD-10-CM

## 2023-10-01 DIAGNOSIS — Z8711 Personal history of peptic ulcer disease: Secondary | ICD-10-CM

## 2023-10-01 DIAGNOSIS — Z9049 Acquired absence of other specified parts of digestive tract: Secondary | ICD-10-CM

## 2023-10-01 DIAGNOSIS — Z638 Other specified problems related to primary support group: Secondary | ICD-10-CM

## 2023-10-01 DIAGNOSIS — H9190 Unspecified hearing loss, unspecified ear: Secondary | ICD-10-CM | POA: Diagnosis present

## 2023-10-01 DIAGNOSIS — G928 Other toxic encephalopathy: Secondary | ICD-10-CM | POA: Diagnosis present

## 2023-10-01 DIAGNOSIS — F0394 Unspecified dementia, unspecified severity, with anxiety: Secondary | ICD-10-CM | POA: Diagnosis present

## 2023-10-01 DIAGNOSIS — E0590 Thyrotoxicosis, unspecified without thyrotoxic crisis or storm: Secondary | ICD-10-CM | POA: Insufficient documentation

## 2023-10-01 DIAGNOSIS — J9621 Acute and chronic respiratory failure with hypoxia: Secondary | ICD-10-CM | POA: Diagnosis present

## 2023-10-01 DIAGNOSIS — R000 Tachycardia, unspecified: Secondary | ICD-10-CM | POA: Diagnosis present

## 2023-10-01 DIAGNOSIS — G894 Chronic pain syndrome: Secondary | ICD-10-CM | POA: Diagnosis present

## 2023-10-01 DIAGNOSIS — I110 Hypertensive heart disease with heart failure: Secondary | ICD-10-CM | POA: Diagnosis present

## 2023-10-01 DIAGNOSIS — I739 Peripheral vascular disease, unspecified: Secondary | ICD-10-CM | POA: Diagnosis present

## 2023-10-01 DIAGNOSIS — F32A Depression, unspecified: Secondary | ICD-10-CM | POA: Diagnosis present

## 2023-10-01 DIAGNOSIS — I2510 Atherosclerotic heart disease of native coronary artery without angina pectoris: Secondary | ICD-10-CM | POA: Diagnosis present

## 2023-10-01 DIAGNOSIS — L97519 Non-pressure chronic ulcer of other part of right foot with unspecified severity: Secondary | ICD-10-CM | POA: Diagnosis present

## 2023-10-01 DIAGNOSIS — Z515 Encounter for palliative care: Secondary | ICD-10-CM

## 2023-10-01 DIAGNOSIS — Z811 Family history of alcohol abuse and dependence: Secondary | ICD-10-CM

## 2023-10-01 DIAGNOSIS — Z87891 Personal history of nicotine dependence: Secondary | ICD-10-CM

## 2023-10-01 DIAGNOSIS — Z681 Body mass index (BMI) 19 or less, adult: Secondary | ICD-10-CM

## 2023-10-01 DIAGNOSIS — Z7951 Long term (current) use of inhaled steroids: Secondary | ICD-10-CM

## 2023-10-01 DIAGNOSIS — E039 Hypothyroidism, unspecified: Secondary | ICD-10-CM | POA: Diagnosis present

## 2023-10-01 DIAGNOSIS — E43 Unspecified severe protein-calorie malnutrition: Secondary | ICD-10-CM | POA: Diagnosis present

## 2023-10-01 DIAGNOSIS — Z89421 Acquired absence of other right toe(s): Secondary | ICD-10-CM

## 2023-10-01 LAB — BASIC METABOLIC PANEL
Anion gap: 7 (ref 5–15)
BUN: 42 mg/dL — ABNORMAL HIGH (ref 8–23)
CO2: 39 mmol/L — ABNORMAL HIGH (ref 22–32)
Calcium: 8.6 mg/dL — ABNORMAL LOW (ref 8.9–10.3)
Chloride: 96 mmol/L — ABNORMAL LOW (ref 98–111)
Creatinine, Ser: 0.82 mg/dL (ref 0.44–1.00)
GFR, Estimated: >60 mL/min (ref >60)
Glucose, Bld: 115 mg/dL — ABNORMAL HIGH (ref 70–99)
Potassium: 3.6 mmol/L (ref 3.5–5.1)
Sodium: 142 mmol/L (ref 135–145)

## 2023-10-01 LAB — BLOOD GAS, ARTERIAL
Acid-Base Excess: 17.6 mmol/L — ABNORMAL HIGH (ref 0.0–2.0)
Bicarbonate: 47.4 mmol/L — ABNORMAL HIGH (ref 20.0–28.0)
Delivery systems: POSITIVE
Expiratory PAP: 5 cm[H2O]
FIO2: 40 %
Inspiratory PAP: 14 cm[H2O]
O2 Saturation: 93.8 %
Patient temperature: 37
pCO2 arterial: 82 mm[Hg] (ref 32–48)
pH, Arterial: 7.37 (ref 7.35–7.45)
pO2, Arterial: 65 mm[Hg] — ABNORMAL LOW (ref 83–108)

## 2023-10-01 LAB — CBC
HCT: 41.2 % (ref 36.0–46.0)
Hemoglobin: 12.3 g/dL (ref 12.0–15.0)
MCH: 29.1 pg (ref 26.0–34.0)
MCHC: 29.9 g/dL — ABNORMAL LOW (ref 30.0–36.0)
MCV: 97.4 fL (ref 80.0–100.0)
Platelets: 353 10*3/uL (ref 150–400)
RBC: 4.23 MIL/uL (ref 3.87–5.11)
RDW: 14.1 % (ref 11.5–15.5)
WBC: 12.3 10*3/uL — ABNORMAL HIGH (ref 4.0–10.5)
nRBC: 0 % (ref 0.0–0.2)

## 2023-10-01 LAB — RESP PANEL BY RT-PCR (RSV, FLU A&B, COVID)  RVPGX2
Influenza A by PCR: NEGATIVE
Influenza B by PCR: NEGATIVE
Resp Syncytial Virus by PCR: NEGATIVE
SARS Coronavirus 2 by RT PCR: NEGATIVE

## 2023-10-01 LAB — CBG MONITORING, ED
Glucose-Capillary: 157 mg/dL — ABNORMAL HIGH (ref 70–99)
Glucose-Capillary: 178 mg/dL — ABNORMAL HIGH (ref 70–99)

## 2023-10-01 LAB — TROPONIN I (HIGH SENSITIVITY)
Troponin I (High Sensitivity): 97 ng/L — ABNORMAL HIGH (ref <18)
Troponin I (High Sensitivity): 63 ng/L — ABNORMAL HIGH (ref <18)

## 2023-10-01 MED ORDER — NICOTINE 14 MG/24HR TD PT24
14.0000 mg | MEDICATED_PATCH | Freq: Every day | TRANSDERMAL | Status: DC
  Administered 2023-10-06 – 2023-10-07 (×2): 14 mg via TRANSDERMAL
  Filled 2023-10-01 (×4): qty 1

## 2023-10-01 MED ORDER — SODIUM CHLORIDE 0.9 % IV SOLN
2.0000 g | Freq: Once | INTRAVENOUS | Status: AC
  Administered 2023-10-01: 2 g via INTRAVENOUS
  Filled 2023-10-01: qty 20

## 2023-10-01 MED ORDER — BISACODYL 5 MG PO TBEC
5.0000 mg | DELAYED_RELEASE_TABLET | Freq: Every day | ORAL | Status: DC | PRN
  Administered 2023-10-05: 5 mg via ORAL
  Filled 2023-10-01: qty 1

## 2023-10-01 MED ORDER — PREDNISONE 20 MG PO TABS
40.0000 mg | ORAL_TABLET | Freq: Every day | ORAL | Status: AC
  Administered 2023-10-03 – 2023-10-06 (×4): 40 mg via ORAL
  Filled 2023-10-01 (×4): qty 2

## 2023-10-01 MED ORDER — POLYETHYLENE GLYCOL 3350 17 G PO PACK: 17.0000 g | PACK | Freq: Every day | ORAL | Status: DC | PRN

## 2023-10-01 MED ORDER — ATORVASTATIN CALCIUM 10 MG PO TABS
10.0000 mg | ORAL_TABLET | Freq: Every day | ORAL | Status: DC
  Administered 2023-10-02 – 2023-10-08 (×7): 10 mg via ORAL
  Filled 2023-10-01 (×7): qty 1

## 2023-10-01 MED ORDER — GUAIFENESIN ER 600 MG PO TB12
600.0000 mg | ORAL_TABLET | Freq: Two times a day (BID) | ORAL | Status: DC | PRN
Start: 2023-10-01 — End: 2023-10-08
  Administered 2023-10-07: 600 mg via ORAL
  Filled 2023-10-01: qty 1

## 2023-10-01 MED ORDER — OXYCODONE HCL 5 MG PO TABS
5.0000 mg | ORAL_TABLET | Freq: Three times a day (TID) | ORAL | Status: DC | PRN
  Filled 2023-10-01: qty 1

## 2023-10-01 MED ORDER — ONDANSETRON HCL 4 MG/2ML IJ SOLN: 4.0000 mg | Freq: Four times a day (QID) | INTRAMUSCULAR | Status: DC | PRN

## 2023-10-01 MED ORDER — ACETAMINOPHEN 650 MG RE SUPP: 650.0000 mg | Freq: Four times a day (QID) | RECTAL | Status: DC | PRN

## 2023-10-01 MED ORDER — METHYLPREDNISOLONE SODIUM SUCC 125 MG IJ SOLR
125.0000 mg | Freq: Once | INTRAMUSCULAR | Status: AC
  Administered 2023-10-01: 125 mg via INTRAVENOUS
  Filled 2023-10-01: qty 2

## 2023-10-01 MED ORDER — LEVOTHYROXINE SODIUM 50 MCG PO TABS
50.0000 ug | ORAL_TABLET | Freq: Every morning | ORAL | Status: DC
  Administered 2023-10-02: 50 ug via ORAL
  Filled 2023-10-01: qty 1

## 2023-10-01 MED ORDER — METOPROLOL TARTRATE 25 MG PO TABS
25.0000 mg | ORAL_TABLET | Freq: Two times a day (BID) | ORAL | Status: DC
  Filled 2023-10-01: qty 1

## 2023-10-01 MED ORDER — HEPARIN SODIUM (PORCINE) 5000 UNIT/ML IJ SOLN
5000.0000 [IU] | Freq: Three times a day (TID) | INTRAMUSCULAR | Status: DC
  Administered 2023-10-01 – 2023-10-02 (×2): 5000 [IU] via SUBCUTANEOUS
  Filled 2023-10-01 (×2): qty 1

## 2023-10-01 MED ORDER — RISPERIDONE 0.25 MG PO TABS
0.2500 mg | ORAL_TABLET | Freq: Every day | ORAL | Status: DC
  Administered 2023-10-02 – 2023-10-07 (×6): 0.25 mg via ORAL
  Filled 2023-10-01 (×7): qty 1

## 2023-10-01 MED ORDER — TRAZODONE HCL 100 MG PO TABS: 300.0000 mg | ORAL_TABLET | Freq: Every day | ORAL | Status: DC

## 2023-10-01 MED ORDER — HYDRALAZINE HCL 20 MG/ML IJ SOLN: 5.0000 mg | INTRAMUSCULAR | Status: DC | PRN

## 2023-10-01 MED ORDER — THIAMINE MONONITRATE 100 MG PO TABS
100.0000 mg | ORAL_TABLET | Freq: Every day | ORAL | Status: DC
  Administered 2023-10-02 – 2023-10-08 (×7): 100 mg via ORAL
  Filled 2023-10-01 (×7): qty 1

## 2023-10-01 MED ORDER — OXYCODONE-ACETAMINOPHEN 5-325 MG PO TABS
1.0000 | ORAL_TABLET | Freq: Three times a day (TID) | ORAL | Status: DC | PRN
  Administered 2023-10-02: 1 via ORAL
  Filled 2023-10-01: qty 1

## 2023-10-01 MED ORDER — IPRATROPIUM-ALBUTEROL 0.5-2.5 (3) MG/3ML IN SOLN
3.0000 mL | Freq: Four times a day (QID) | RESPIRATORY_TRACT | Status: DC | PRN
  Administered 2023-10-05 – 2023-10-08 (×2): 3 mL via RESPIRATORY_TRACT
  Filled 2023-10-01 (×2): qty 3

## 2023-10-01 MED ORDER — LACTATED RINGERS IV SOLN: INTRAVENOUS | Status: DC

## 2023-10-01 MED ORDER — ENSURE ENLIVE PO LIQD: 237.0000 mL | Freq: Three times a day (TID) | ORAL | Status: DC

## 2023-10-01 MED ORDER — PANTOPRAZOLE SODIUM 40 MG PO TBEC
40.0000 mg | DELAYED_RELEASE_TABLET | Freq: Every day | ORAL | Status: DC
  Administered 2023-10-01 – 2023-10-08 (×8): 40 mg via ORAL
  Filled 2023-10-01 (×8): qty 1

## 2023-10-01 MED ORDER — MELATONIN 5 MG PO TABS
5.0000 mg | ORAL_TABLET | Freq: Every day | ORAL | Status: DC
  Filled 2023-10-01: qty 1

## 2023-10-01 MED ORDER — HYDRALAZINE HCL 20 MG/ML IJ SOLN
5.0000 mg | Freq: Four times a day (QID) | INTRAMUSCULAR | Status: DC | PRN
Start: 2023-10-01 — End: 2023-10-08

## 2023-10-01 MED ORDER — IPRATROPIUM-ALBUTEROL 0.5-2.5 (3) MG/3ML IN SOLN
3.0000 mL | Freq: Four times a day (QID) | RESPIRATORY_TRACT | Status: DC
  Administered 2023-10-01 – 2023-10-02 (×3): 3 mL via RESPIRATORY_TRACT
  Filled 2023-10-01 (×4): qty 3

## 2023-10-01 MED ORDER — ONDANSETRON HCL 4 MG PO TABS: 4.0000 mg | ORAL_TABLET | Freq: Four times a day (QID) | ORAL | Status: DC | PRN

## 2023-10-01 MED ORDER — IPRATROPIUM-ALBUTEROL 0.5-2.5 (3) MG/3ML IN SOLN
3.0000 mL | Freq: Once | RESPIRATORY_TRACT | Status: AC
  Administered 2023-10-01: 3 mL via RESPIRATORY_TRACT
  Filled 2023-10-01: qty 3

## 2023-10-01 MED ORDER — SODIUM CHLORIDE 0.9 % IV SOLN: 1.0000 g | INTRAVENOUS | Status: DC

## 2023-10-01 MED ORDER — POLYSACCHARIDE IRON COMPLEX 150 MG PO CAPS
150.0000 mg | ORAL_CAPSULE | Freq: Every day | ORAL | Status: DC
Start: 2023-10-02 — End: 2023-10-08
  Administered 2023-10-03 – 2023-10-08 (×6): 150 mg via ORAL
  Filled 2023-10-01 (×7): qty 1

## 2023-10-01 MED ORDER — ASPIRIN 81 MG PO TBEC
81.0000 mg | DELAYED_RELEASE_TABLET | Freq: Every day | ORAL | Status: DC
  Administered 2023-10-01 – 2023-10-08 (×8): 81 mg via ORAL
  Filled 2023-10-01 (×8): qty 1

## 2023-10-01 MED ORDER — DEXTROSE 5 % IV SOLN
500.0000 mg | Freq: Once | INTRAVENOUS | Status: AC
  Administered 2023-10-01: 500 mg via INTRAVENOUS
  Filled 2023-10-01: qty 5

## 2023-10-01 MED ORDER — OXYCODONE-ACETAMINOPHEN 10-325 MG PO TABS: 1.0000 | ORAL_TABLET | Freq: Three times a day (TID) | ORAL | Status: DC | PRN

## 2023-10-01 MED ORDER — SODIUM CHLORIDE 0.9% FLUSH
3.0000 mL | Freq: Two times a day (BID) | INTRAVENOUS | Status: DC
  Administered 2023-10-02 – 2023-10-06 (×9): 3 mL via INTRAVENOUS

## 2023-10-01 MED ORDER — AMLODIPINE BESYLATE 5 MG PO TABS: 5.0000 mg | ORAL_TABLET | Freq: Every day | ORAL | Status: DC

## 2023-10-01 MED ORDER — ACETAMINOPHEN 325 MG PO TABS
650.0000 mg | ORAL_TABLET | Freq: Four times a day (QID) | ORAL | Status: DC | PRN
  Administered 2023-10-03 – 2023-10-07 (×2): 650 mg via ORAL
  Filled 2023-10-01 (×3): qty 2

## 2023-10-01 MED ORDER — ALPRAZOLAM 0.5 MG PO TABS
1.0000 mg | ORAL_TABLET | Freq: Three times a day (TID) | ORAL | Status: DC | PRN
  Administered 2023-10-01: 1 mg via ORAL
  Filled 2023-10-01: qty 2

## 2023-10-01 MED ORDER — IOHEXOL 350 MG/ML SOLN
80.0000 mL | Freq: Once | INTRAVENOUS | Status: AC | PRN
  Administered 2023-10-01: 75 mL via INTRAVENOUS

## 2023-10-01 MED ORDER — METHYLPREDNISOLONE SODIUM SUCC 125 MG IJ SOLR
60.0000 mg | Freq: Two times a day (BID) | INTRAMUSCULAR | Status: AC
  Administered 2023-10-01 – 2023-10-02 (×2): 60 mg via INTRAVENOUS
  Filled 2023-10-01 (×2): qty 2

## 2023-10-01 NOTE — ED Triage Notes (Signed)
Patient to ED via [facility] from home for SOB. Symptoms started today. Hx of COPD- wears 3L at baseline. Alert to self and place.

## 2023-10-01 NOTE — Assessment & Plan Note (Signed)
SpO2: 96 % O2 Flow Rate (L/min): 6 L/min FiO2 (%): 40 % Pt is currently on bipap.  Repeat Abg is pending will continue as deemed appropriate.  Will continue abx with azithromycin and rocephin.

## 2023-10-01 NOTE — ED Notes (Signed)
Attempted to call grandson to give an update.

## 2023-10-01 NOTE — ED Notes (Signed)
Called respiratory regarding stat ABG

## 2023-10-01 NOTE — ED Notes (Signed)
Pt gone to CT. Pt cleaned up and bedding changed. Pt was saturated with urine.

## 2023-10-01 NOTE — Progress Notes (Signed)
Attempted to place pt back on BIPAP per RN request. Pt stated she could not wear it because she can't have anything over her face. Adjusted the settings to 10/5, pt stated she was not going to wear BIPAP. RN informed.

## 2023-10-01 NOTE — ED Notes (Signed)
Pt placed on bipap at this time.

## 2023-10-01 NOTE — H&P (Signed)
History and Physical    Patient: [name] GNF:[id number] DOB: Dec 03, 1945 DOA: 10/01/2023 DOS: the patient was seen and examined on 10/01/2023 PCP: [name], MD  Patient coming from: Home  Chief Complaint:  Chief Complaint  Patient presents with   Shortness of Breath    HPI: [name] is a 77 y.o. female with medical history significant for CAD, hypothyroid, advanced COPD seen by pulmonary [name], chronic respiratory failure with 3 L of oxygen nasal cannula at home, hyperlipidemia, hypertension, who presents to the emergency department for sob that started acutely today with hypoxia/ hypercapnia on bipap, with reports of intermittent chest pain currently with elevated troponin of 97 and EKG showing regular fast rhythm which is most likely atrial flutter repeat EKG is pending, CTA chest is ordered as patient is high risk for VTE with her history of reduction internal fixation and displaced intertrochanteric right hip fracture, also history of PAD followed by vascular Dr. [name] and history of amputations of the second, third and fourth MTP, chest xray is negative  with white count of  12.3. Admission requested for suspected COPD exacerbation. In emergency room vitals trend shows patient meeting SIRS criteria infection is likely but not suspected. Vitals:   10/01/23 1700 10/01/23 1730 10/01/23 1800 10/01/23 1900  BP: (!) 135/94 (!) 134/99 134/75 137/79  Pulse: 100 100 (!) 103 (!) 111  Temp:      Resp: (!) 36 (!) 24 (!) 28 (!) 36  Height:      Weight:      SpO2: 100% 100% 97% 100%  TempSrc:      BMI (Calculated):      Labs are notable for : ABG showing normal pH pCO2 of 82 pO2 of 72. Basic metabolic panel shows normal kidney function glucose 115 bicarb 39 LFTs added and pending. CBC shows leukocytosis of 12.3 normal hemoglobin and platelet count. Respiratory panel negative for RSV COVID and flu. 2 D Echo this year shows: 1. Left ventricular ejection fraction, by  estimation, is 55 to 60%. The  left ventricle has normal function. Left ventricular endocardial border  not optimally defined to evaluate regional wall motion. Indeterminate  diastolic filling due to E-A fusion.   2. Right ventricular systolic function is normal. The right ventricular  size is normal. Tricuspid regurgitation signal is inadequate for assessing  PA pressure.   3. The mitral valve is grossly normal. Trivial mitral valve  regurgitation. No evidence of mitral stenosis.   4. The aortic valve has an indeterminant number of cusps. There is mild  thickening of the aortic valve. Aortic valve regurgitation is not  visualized. No aortic stenosis is present.   5. The inferior vena cava is normal in size with greater than 50%  respiratory variability, suggesting right atrial pressure of 3 mmHg.   Spoke to Mr.[name]:  Who saw grandma 3-4 days.   In the ED pt received: Medications  azithromycin (ZITHROMAX) 500 mg in dextrose 5 % 250 mL IVPB (has no administration in time range)  amLODipine (NORVASC) tablet 5 mg (has no administration in time range)  aspirin EC tablet 81 mg (has no administration in time range)  atorvastatin (LIPITOR) tablet 10 mg (has no administration in time range)  feeding supplement (ENSURE ENLIVE / ENSURE PLUS) liquid 237 mL (has no administration in time range)  iron polysaccharides (NIFEREX) capsule 150 mg (has no administration in time range)  melatonin tablet 5 mg (has no administration in time range)  metoprolol tartrate (  LOPRESSOR) tablet 25 mg (has no administration in time range)  oxyCODONE-acetaminophen (PERCOCET) 10-325 MG per tablet 1 tablet (has no administration in time range)  risperiDONE (RISPERDAL) tablet 0.25 mg (has no administration in time range)  thiamine (VITAMIN B1) tablet 100 mg (has no administration in time range)  ALPRAZolam (XANAX) tablet 1 mg (has no administration in time range)  ipratropium-albuterol (DUONEB) 0.5-2.5 (3) MG/3ML  nebulizer solution 3 mL (has no administration in time range)  levothyroxine (SYNTHROID) tablet 50 mcg (has no administration in time range)  pantoprazole (PROTONIX) EC tablet 40 mg (has no administration in time range)  traZODone (DESYREL) tablet 300 mg (has no administration in time range)  methylPREDNISolone sodium succinate (SOLU-MEDROL) 125 mg/2 mL injection 60 mg (has no administration in time range)    Followed by  predniSONE (DELTASONE) tablet 40 mg (has no administration in time range)  ipratropium-albuterol (DUONEB) 0.5-2.5 (3) MG/3ML nebulizer solution 3 mL (has no administration in time range)  sodium chloride flush (NS) 0.9 % injection 3 mL (has no administration in time range)  lactated ringers infusion (has no administration in time range)  acetaminophen (TYLENOL) tablet 650 mg (has no administration in time range)    Or  acetaminophen (TYLENOL) suppository 650 mg (has no administration in time range)  polyethylene glycol (MIRALAX / GLYCOLAX) packet 17 g (has no administration in time range)  bisacodyl (DULCOLAX) EC tablet 5 mg (has no administration in time range)  ondansetron (ZOFRAN) tablet 4 mg (has no administration in time range)    Or  ondansetron (ZOFRAN) injection 4 mg (has no administration in time range)  guaiFENesin (MUCINEX) 12 hr tablet 600 mg (has no administration in time range)  nicotine (NICODERM CQ - dosed in mg/24 hours) patch 14 mg (has no administration in time range)  hydrALAZINE (APRESOLINE) injection 5 mg (has no administration in time range)  cefTRIAXone (ROCEPHIN) 1 g in sodium chloride 0.9 % 100 mL IVPB (has no administration in time range)  heparin injection 5,000 Units (has no administration in time range)  ipratropium-albuterol (DUONEB) 0.5-2.5 (3) MG/3ML nebulizer solution 3 mL (3 mLs Nebulization Given 10/01/23 1449)  methylPREDNISolone sodium succinate (SOLU-MEDROL) 125 mg/2 mL injection 125 mg (125 mg Intravenous Given 10/01/23 1509)   cefTRIAXone (ROCEPHIN) 2 g in sodium chloride 0.9 % 100 mL IVPB (2 g Intravenous New Bag/Given 10/01/23 1831)  ipratropium-albuterol (DUONEB) 0.5-2.5 (3) MG/3ML nebulizer solution 3 mL (3 mLs Nebulization Given 10/01/23 1828)  iohexol (OMNIPAQUE) 350 MG/ML injection 80 mL (75 mLs Intravenous Contrast Given 10/01/23 1820)   Review of Systems  Constitutional:  Positive for malaise/fatigue.  Respiratory:  Positive for shortness of breath.   Cardiovascular:  Positive for chest pain. Negative for leg swelling.  All other systems reviewed and are negative.  Past Medical History:  Diagnosis Date   Anxiety    CAD (coronary artery disease)    Carpal tunnel syndrome    Cataract    Complication of anesthesia    has woken up at times   COPD (chronic obstructive pulmonary disease) (HCC)    CRP elevated 09/14/2015   Depression    Difficult intubation    Dyslipidemia    Dyspnea    DOE   Dysrhythmia    hx palpatations   Elevated sedimentation rate 09/14/2015   Esophageal spasm    Gastrointestinal parasites    GERD (gastroesophageal reflux disease)    HCAP (healthcare-associated pneumonia) 10/22/2018   Hiatal hernia    History of peptic ulcer disease  Hypertension    Hyperthyroidism    Hypothyroidism    Low magnesium levels 09/14/2015   Nicotine dependence 09/14/2015   Pelvic fracture (HCC) 2008   fall from riding a horse   Peripheral vascular disease (HCC)    Reflux    Rotator cuff injury    Sepsis (HCC) 10/22/2018   Stenosis, spinal, lumbar    Past Surgical History:  Procedure Laterality Date   AMPUTATION TOE Right 12/26/2020   Procedure: AMPUTATION TOE-3rd Toes;  Surgeon: Gwyneth Revels, DPM;  Location: [facility];  Service: Podiatry;  Laterality: Right;   AMPUTATION TOE Right 04/22/2021   Procedure: AMPUTATION TOE- RIGHT 2ND;  Surgeon: Gwyneth Revels, DPM;  Location: [facility];  Service: Podiatry;  Laterality: Right;   AMPUTATION TOE Right 10/21/2021   Procedure: AMPUTATION TOE;   Surgeon: Rosetta Posner, DPM;  Location: [facility];  Service: Podiatry;  Laterality: Right;   AMPUTATION TOE Right 11/23/2021   Procedure: AMPUTATION TOE;  Surgeon: Linus Galas, DPM;  Location: [facility];  Service: Podiatry;  Laterality: Right;   AMPUTATION TOE Right 07/05/2022   Procedure: AMPUTATION RIGHT 5TH TOE;  Surgeon: Edwin Cap, DPM;  Location: [facility];  Service: Podiatry;  Laterality: Right;   APPENDECTOMY     BACK SURGERY     CERVICAL FUSION   CARPAL TUNNEL RELEASE     CATARACT EXTRACTION W/PHACO Right 08/07/2017   Procedure: CATARACT EXTRACTION PHACO AND INTRAOCULAR LENS PLACEMENT (IOC);  Surgeon: Galen Manila, MD;  Location: [facility];  Service: Ophthalmology;  Laterality: Right;  [place] 00:52.0 AP% 16.8 CDE 8.74 Fluid Pack Lot # [id number]   CATARACT EXTRACTION W/PHACO Left 09/04/2017   Procedure: CATARACT EXTRACTION PHACO AND INTRAOCULAR LENS PLACEMENT (IOC);  Surgeon: Galen Manila, MD;  Location: [facility];  Service: Ophthalmology;  Laterality: Left;  [place] 00:34 AP% 17.0 CDE 5.80 Fluid pack lot # [id number]   CESAREAN SECTION     CHOLECYSTECTOMY     FRACTURE SURGERY     HIP SURGERY     INTRAMEDULLARY (IM) NAIL INTERTROCHANTERIC Right 10/01/2016   Procedure: INTRAMEDULLARY (IM) NAIL INTERTROCHANTRIC;  Surgeon: Christena Flake, MD;  Location: [facility];  Service: Orthopedics;  Laterality: Right;   INTRAMEDULLARY (IM) NAIL INTERTROCHANTERIC Left 01/14/2021   Procedure: INTRAMEDULLARY (IM) NAIL INTERTROCHANTRIC;  Surgeon: Lyndle Herrlich, MD;  Location: [facility];  Service: Orthopedics;  Laterality: Left;   KYPHOPLASTY N/A 08/20/2018   Procedure: [id number];  Surgeon: Kennedy Bucker, MD;  Location: [facility];  Service: Orthopedics;  Laterality: N/A;   LOWER EXTREMITY ANGIOGRAPHY Right 12/18/2019   Procedure: LOWER EXTREMITY ANGIOGRAPHY;  Surgeon: Annice Needy, MD;  Location: [facility];  Service: Cardiovascular;  Laterality: Right;   LOWER EXTREMITY ANGIOGRAPHY Right  12/02/2020   Procedure: LOWER EXTREMITY ANGIOGRAPHY;  Surgeon: Annice Needy, MD;  Location: [facility];  Service: Cardiovascular;  Laterality: Right;   LOWER EXTREMITY ANGIOGRAPHY Right 12/23/2020   Procedure: Lower Extremity Angiography;  Surgeon: Annice Needy, MD;  Location: [facility];  Service: Cardiovascular;  Laterality: Right;   LOWER EXTREMITY ANGIOGRAPHY Right 12/24/2020   Procedure: Lower Extremity Angiography;  Surgeon: Annice Needy, MD;  Location: [facility];  Service: Cardiovascular;  Laterality: Right;   LOWER EXTREMITY ANGIOGRAPHY Right 10/20/2021   Procedure: Lower Extremity Angiography;  Surgeon: Annice Needy, MD;  Location: [facility];  Service: Cardiovascular;  Laterality: Right;   LOWER EXTREMITY ANGIOGRAPHY Right 07/03/2022   Procedure: Lower Extremity Angiography;  Surgeon:  Annice Needy, MD;  Location: [facility];  Service: Cardiovascular;  Laterality: Right;   NECK SURGERY     NOSE SURGERY     ROTATOR CUFF REPAIR     x2   SHOULDER ARTHROSCOPY  12/07/2011   Procedure: ARTHROSCOPY SHOULDER;  Surgeon: Loreta Ave, MD;  Location: [facility];  Service: Orthopedics;  Laterality: Right;  Debridement Partial Cuff Tear, Release Coracoacromial Ligament   SHOULDER SURGERY  12/07/2011   right    reports that she has been smoking cigarettes. She has never used smokeless tobacco. She reports that she does not drink alcohol and does not use drugs.  No Known Allergies  Family History  Problem Relation Age of Onset   Alcoholism Brother    Aneurysm Other    Prior to Admission medications   Medication Sig Start Date End Date Taking? Authorizing Provider  albuterol (VENTOLIN HFA) 108 (90 Base) MCG/ACT inhaler Inhale 1-2 puffs into the lungs every 4 (four) hours as needed for shortness of breath or wheezing. 06/28/22   [provider]  amLODipine (NORVASC) 5 MG tablet Take 1 tablet (5 mg total) by mouth daily. 05/10/23    Leeroy Bock, MD  ammonium lactate (LAC-HYDRIN) 12 % lotion APPLY TO AFFECTED AREA AS NEEDED FOR DRY SKIN 08/09/23   Candelaria Stagers, DPM  ASPIRIN LOW DOSE 81 MG tablet TAKE 1 TABLET BY MOUTH EVERY DAY 05/07/23   Georgiana Spinner, NP  atorvastatin (LIPITOR) 10 MG tablet Take 1 tablet (10 mg total) by mouth daily. Patient not taking: Reported on 05/06/2023 11/10/22 11/10/23  Arnetha Courser, MD  benzonatate (TESSALON) 100 MG capsule Take 1 capsule (100 mg total) by mouth 3 (three) times daily as needed for cough. 03/02/23   Gillis Santa, MD  feeding supplement (ENSURE ENLIVE / ENSURE PLUS) LIQD Take 237 mLs by mouth 3 (three) times daily between meals. 02/03/23   Darlin Priestly, MD  fluticasone-salmeterol (ADVAIR) 250-50 MCG/ACT AEPB Inhale 1 puff into the lungs 2 (two) times daily as needed.    [provider]  hydrOXYzine (ATARAX) 25 MG tablet Take 1 tablet (25 mg total) by mouth 3 (three) times daily as needed for anxiety. 03/02/23   Gillis Santa, MD  ipratropium-albuterol (DUONEB) 0.5-2.5 (3) MG/3ML SOLN Take 3 mLs by nebulization every 6 (six) hours as needed.    [provider]  iron polysaccharides (NIFEREX) 150 MG capsule Take 1 capsule (150 mg total) by mouth daily. 03/03/23 06/01/23  Gillis Santa, MD  levothyroxine (SYNTHROID) 50 MCG tablet Take 50 mcg by mouth every morning.    [provider]  melatonin 5 MG TABS Take 1 tablet (5 mg total) by mouth at bedtime. 03/02/23 03/01/24  Gillis Santa, MD  metoprolol tartrate (LOPRESSOR) 25 MG tablet Take 1 tablet (25 mg total) by mouth 2 (two) times daily. Skip the dose if systolic BP less than 130 mmHg and/or heart rate less than 65 beats per minute 03/02/23 08/29/23  Gillis Santa, MD  Multiple Vitamin (MULTIVITAMIN WITH MINERALS) TABS tablet Take 1 tablet by mouth daily. 11/10/22   Arnetha Courser, MD  omeprazole (PRILOSEC) 40 MG capsule Take 40 mg by mouth daily.    [provider]  oxyCODONE-acetaminophen (PERCOCET)  10-325 MG tablet Take 1 tablet by mouth every 4 (four) hours as needed for pain. 07/03/23   Candelaria Stagers, DPM  polyethylene glycol (MIRALAX / GLYCOLAX) 17 g packet Take 17 g by mouth daily as needed. 05/16/23  Sreenath, Sudheer B, MD  potassium chloride SA (KLOR-CON M) 20 MEQ tablet Take 20 mEq by mouth daily.    [provider]  risperiDONE (RISPERDAL) 0.25 MG tablet Take 1 tablet (0.25 mg total) by mouth at bedtime. 03/02/23 05/31/23  Gillis Santa, MD  thiamine (VITAMIN B-1) 100 MG tablet Take 1 tablet (100 mg total) by mouth daily. 11/10/22   Arnetha Courser, MD  traZODone (DESYREL) 150 MG tablet Take 300 mg by mouth at bedtime.    [provider]   Vitals:   10/01/23 1700 10/01/23 1730 10/01/23 1800 10/01/23 1900  BP: (!) 135/94 (!) 134/99 134/75 137/79  Pulse: 100 100 (!) 103 (!) 111  Resp: (!) 36 (!) 24 (!) 28 (!) 36  Temp:      TempSrc:      SpO2: 100% 100% 97% 100%  Weight:      Height:       Physical Exam Vitals and nursing note reviewed.  Constitutional:      General: She is not in acute distress.    Appearance: She is underweight.     Comments: Frail.   HENT:     Head: Normocephalic and atraumatic.     Right Ear: Hearing and external ear normal.     Left Ear: Hearing and external ear normal.     Nose: Nose normal. No nasal deformity.     Mouth/Throat:     Lips: Pink.     Tongue: No lesions.     Pharynx: Oropharynx is clear.  Eyes:     General: Lids are normal.     Extraocular Movements: Extraocular movements intact.  Cardiovascular:     Rate and Rhythm: Regular rhythm. Tachycardia present.     Heart sounds: Normal heart sounds.  Pulmonary:     Effort: Pulmonary effort is normal.     Breath sounds: Wheezing present.  Abdominal:     General: Bowel sounds are normal. There is no distension.     Palpations: Abdomen is soft. There is no mass.     Tenderness: There is no abdominal tenderness.  Musculoskeletal:     Right lower leg: No edema.      Left lower leg: No edema.  Skin:    General: Skin is warm.  Neurological:     General: No focal deficit present.     Mental Status: She is alert and oriented to person, place, and time.     Cranial Nerves: Cranial nerves 2-12 are intact.  Psychiatric:        Attention and Perception: Attention normal.        Mood and Affect: Mood normal.        Speech: Speech normal.        Behavior: Behavior normal. Behavior is cooperative.     Labs on Admission: I have personally reviewed following labs and imaging studies Results for orders placed or performed during the hospital encounter of 10/01/23 (from the past 24 hour(s))  Basic metabolic panel     Status: Abnormal   Collection Time: 10/01/23  2:01 PM  Result Value Ref Range   Sodium 142 135 - 145 mmol/L   Potassium 3.6 3.5 - 5.1 mmol/L   Chloride 96 (L) 98 - 111 mmol/L   CO2 39 (H) 22 - 32 mmol/L   Glucose, Bld 115 (H) 70 - 99 mg/dL   BUN 42 (H) 8 - 23 mg/dL   Creatinine, Ser 6.21 0.44 - 1.00 mg/dL   Calcium 8.6 (L) 8.9 -  10.3 mg/dL   GFR, Estimated >59 >56 mL/min   Anion gap 7 5 - 15  CBC     Status: Abnormal   Collection Time: 10/01/23  2:01 PM  Result Value Ref Range   WBC 12.3 (H) 4.0 - 10.5 K/uL   RBC 4.23 3.87 - 5.11 MIL/uL   Hemoglobin 12.3 12.0 - 15.0 g/dL   HCT 38.7 56.4 - 33.2 %   MCV 97.4 80.0 - 100.0 fL   MCH 29.1 26.0 - 34.0 pg   MCHC 29.9 (L) 30.0 - 36.0 g/dL   RDW 95.1 88.4 - 16.6 %   Platelets 353 150 - 400 K/uL   nRBC 0.0 0.0 - 0.2 %  Resp panel by RT-PCR (RSV, Flu A&B, Covid) Anterior Nasal Swab     Status: None   Collection Time: 10/01/23  2:50 PM   Specimen: Anterior Nasal Swab  Result Value Ref Range   SARS Coronavirus 2 by RT PCR NEGATIVE NEGATIVE   Influenza A by PCR NEGATIVE NEGATIVE   Influenza B by PCR NEGATIVE NEGATIVE   Resp Syncytial Virus by PCR NEGATIVE NEGATIVE  Blood gas, arterial     Status: Abnormal (Preliminary result)   Collection Time: 10/01/23  3:00 PM  Result Value Ref Range   pH,  Arterial 7.39 7.35 - 7.45   pCO2 arterial 82 (HH) 32 - 48 mmHg   pO2, Arterial 72 (L) 83 - 108 mmHg   Bicarbonate 49.6 (H) 20.0 - 28.0 mmol/L   Acid-Base Excess 19.9 (H) 0.0 - 2.0 mmol/L   O2 Saturation 96.3 %   Patient temperature 37.0    Collection site RIGHT RADIAL    Allens test (pass/fail) PENDING PASS  Troponin I (High Sensitivity)     Status: Abnormal   Collection Time: 10/01/23  3:09 PM  Result Value Ref Range   Troponin I (High Sensitivity) 97 (H) <18 ng/L    CBC: Recent Labs  Lab 10/01/23 1401  WBC 12.3*  HGB 12.3  HCT 41.2  MCV 97.4  PLT 353   Basic Metabolic Panel: Recent Labs  Lab 10/01/23 1401  NA 142  K 3.6  CL 96*  CO2 39*  GLUCOSE 115*  BUN 42*  CREATININE 0.82  CALCIUM 8.6*   GFR: Estimated Creatinine Clearance: 36.9 mL/min (by C-G formula based on SCr of 0.82 mg/dL). Liver Function Tests: No results for input(s): "AST", "ALT", "ALKPHOS", "BILITOT", "PROT", "ALBUMIN" in the last 168 hours. No results for input(s): "LIPASE", "AMYLASE" in the last 168 hours. No results for input(s): "AMMONIA" in the last 168 hours. Coagulation Profile: No results for input(s): "INR", "PROTIME" in the last 168 hours. Cardiac Enzymes: No results for input(s): "CKTOTAL", "CKMB", "CKMBINDEX", "TROPONINI" in the last 168 hours. BNP (last 3 results) No results for input(s): "PROBNP" in the last 8760 hours. HbA1C: No results for input(s): "HGBA1C" in the last 72 hours. CBG: No results for input(s): "GLUCAP" in the last 168 hours. Lipid Profile: No results for input(s): "CHOL", "HDL", "LDLCALC", "TRIG", "CHOLHDL", "LDLDIRECT" in the last 72 hours. Thyroid Function Tests: No results for input(s): "TSH", "T4TOTAL", "FREET4", "T3FREE", "THYROIDAB" in the last 72 hours. Anemia Panel: No results for input(s): "VITAMINB12", "FOLATE", "FERRITIN", "TIBC", "IRON", "RETICCTPCT" in the last 72 hours. Urinalysis    Component Value Date/Time   COLORURINE YELLOW (A) 07/05/2023  1141   APPEARANCEUR CLEAR (A) 07/05/2023 1141   LABSPEC 1.021 07/05/2023 1141   PHURINE 5.0 07/05/2023 1141   GLUCOSEU NEGATIVE 07/05/2023 1141   HGBUR NEGATIVE  07/05/2023 1141   BILIRUBINUR NEGATIVE 07/05/2023 1141   KETONESUR NEGATIVE 07/05/2023 1141   PROTEINUR NEGATIVE 07/05/2023 1141   NITRITE NEGATIVE 07/05/2023 1141   LEUKOCYTESUR NEGATIVE 07/05/2023 1141   Unresulted Labs (From admission, onward)     Start     Ordered   Unscheduled  Comprehensive metabolic panel  Tomorrow morning,   R        10/01/23 1915   Unscheduled  CBC  Tomorrow morning,   R        10/01/23 1915           Medications  azithromycin (ZITHROMAX) 500 mg in dextrose 5 % 250 mL IVPB (has no administration in time range)  amLODipine (NORVASC) tablet 5 mg (has no administration in time range)  aspirin EC tablet 81 mg (has no administration in time range)  atorvastatin (LIPITOR) tablet 10 mg (has no administration in time range)  feeding supplement (ENSURE ENLIVE / ENSURE PLUS) liquid 237 mL (has no administration in time range)  iron polysaccharides (NIFEREX) capsule 150 mg (has no administration in time range)  melatonin tablet 5 mg (has no administration in time range)  metoprolol tartrate (LOPRESSOR) tablet 25 mg (has no administration in time range)  oxyCODONE-acetaminophen (PERCOCET) 10-325 MG per tablet 1 tablet (has no administration in time range)  risperiDONE (RISPERDAL) tablet 0.25 mg (has no administration in time range)  thiamine (VITAMIN B1) tablet 100 mg (has no administration in time range)  ALPRAZolam (XANAX) tablet 1 mg (has no administration in time range)  ipratropium-albuterol (DUONEB) 0.5-2.5 (3) MG/3ML nebulizer solution 3 mL (has no administration in time range)  levothyroxine (SYNTHROID) tablet 50 mcg (has no administration in time range)  pantoprazole (PROTONIX) EC tablet 40 mg (has no administration in time range)  traZODone (DESYREL) tablet 300 mg (has no administration in time  range)  methylPREDNISolone sodium succinate (SOLU-MEDROL) 125 mg/2 mL injection 60 mg (has no administration in time range)    Followed by  predniSONE (DELTASONE) tablet 40 mg (has no administration in time range)  ipratropium-albuterol (DUONEB) 0.5-2.5 (3) MG/3ML nebulizer solution 3 mL (has no administration in time range)  sodium chloride flush (NS) 0.9 % injection 3 mL (has no administration in time range)  lactated ringers infusion (has no administration in time range)  acetaminophen (TYLENOL) tablet 650 mg (has no administration in time range)    Or  acetaminophen (TYLENOL) suppository 650 mg (has no administration in time range)  polyethylene glycol (MIRALAX / GLYCOLAX) packet 17 g (has no administration in time range)  bisacodyl (DULCOLAX) EC tablet 5 mg (has no administration in time range)  ondansetron (ZOFRAN) tablet 4 mg (has no administration in time range)    Or  ondansetron (ZOFRAN) injection 4 mg (has no administration in time range)  guaiFENesin (MUCINEX) 12 hr tablet 600 mg (has no administration in time range)  nicotine (NICODERM CQ - dosed in mg/24 hours) patch 14 mg (has no administration in time range)  hydrALAZINE (APRESOLINE) injection 5 mg (has no administration in time range)  cefTRIAXone (ROCEPHIN) 1 g in sodium chloride 0.9 % 100 mL IVPB (has no administration in time range)  heparin injection 5,000 Units (has no administration in time range)  ipratropium-albuterol (DUONEB) 0.5-2.5 (3) MG/3ML nebulizer solution 3 mL (3 mLs Nebulization Given 10/01/23 1449)  methylPREDNISolone sodium succinate (SOLU-MEDROL) 125 mg/2 mL injection 125 mg (125 mg Intravenous Given 10/01/23 1509)  cefTRIAXone (ROCEPHIN) 2 g in sodium chloride 0.9 % 100 mL IVPB (2 g Intravenous New  Bag/Given 10/01/23 1831)  ipratropium-albuterol (DUONEB) 0.5-2.5 (3) MG/3ML nebulizer solution 3 mL (3 mLs Nebulization Given 10/01/23 1828)  iohexol (OMNIPAQUE) 350 MG/ML injection 80 mL (75 mLs Intravenous  Contrast Given 10/01/23 1820)    Radiological Exams on Admission: DG Chest Port 1 View  Result Date: 10/01/2023 CLINICAL DATA:  Shortness of breath EXAM: PORTABLE CHEST 1 VIEW COMPARISON:  Chest x-ray 07/05/2023 FINDINGS: The heart size and mediastinal contours are within normal limits. Both lungs are clear. No acute osseous abnormality. Cervical spinal fusion plate is present. IMPRESSION: No active disease. Electronically Signed   By: Darliss Cheney M.D.   On: 10/01/2023 17:49     Data Reviewed: Relevant notes from primary care and specialist visits, past discharge summaries as available in EHR, including Care Everywhere. Prior diagnostic testing as pertinent to current admission diagnoses Updated medications and problem lists for reconciliation ED course, including vitals, labs, imaging, treatment and response to treatment Triage notes, nursing and pharmacy notes and ED provider's notes Notable results as noted in HPI  Assessment & Plan SOB (shortness of breath) 2/2 to COPD or Cardiac etiology, troponin abnormal we will trend and follow.  Acute on chronic respiratory failure with hypoxia and hypercapnia (HCC) SpO2: 96 % O2 Flow Rate (L/min): 6 L/min FiO2 (%): 40 % Pt is currently on bipap.  Repeat Abg is pending will continue as deemed appropriate.  Will continue abx with azithromycin and rocephin.  CAD (coronary artery disease) Troponin at 14 we will follow trend. At bedside pt denies any chest pain to me states her right great toe that has gangrene is hurting her and would like her meds and something to drink like sprite.  Hypertension Vitals:   10/01/23 1515 10/01/23 1600 10/01/23 1700 10/01/23 1730  BP: 108/69 121/75 (!) 135/94 (!) 134/99   10/01/23 1800 10/01/23 1900 10/01/23 1930 10/01/23 2000  BP: 134/75 137/79 136/78 (!) 149/81   10/01/23 2100 10/01/23 2130 10/01/23 2200 10/01/23 2230  BP: 124/79 112/68 107/65 114/75  Patient's amlodipine and metoprolol were continued  however as blood pressure trends are low and we will currently hold.  Also will repeat a swallow study before starting p.o. meds..  As needed hydralazine is in chart if blood pressure starts going up again.  Underweight Due to po intake.  Hypothyroidism We will cont meds once pt is awake and passes swallow.  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 PRN pain meds.  COPD with acute exacerbation (HCC) Cont protocol with steroids iv with po taper.  PRN MDI and nebs per protocol.  DVT prophylaxis:  Heparin q8h.   Consults:  None   Advance Care Planning:    Code Status: Prior   Family Communication:  Rushie Chestnut is primary point of contact.   Disposition Plan:  TBD.   Severity of Illness: The appropriate patient status for this patient is INPATIENT. Inpatient status is judged to be reasonable and necessary in order to provide the required intensity of service to ensure the patient's safety. The patient's presenting symptoms, physical exam findings, and initial radiographic and laboratory data in the context of their chronic comorbidities is felt to place them at high risk for further clinical deterioration. Furthermore, it is not anticipated that the patient will be medically stable for discharge from the hospital within 2 midnights of admission.   * I certify that at the point of admission it  is my clinical judgment that the patient will require inpatient hospital care spanning beyond 2 midnights from the point of admission due to high intensity of service, high risk for further deterioration and high frequency of surveillance required.*  Author: Gertha Calkin, MD 10/01/2023 7:15 PM  For on call review [email or web address].

## 2023-10-01 NOTE — ED Notes (Addendum)
Pt placed back on bipap. Respiratory aware

## 2023-10-01 NOTE — Assessment & Plan Note (Signed)
Cont protocol with steroids iv with po taper.  PRN MDI and nebs per protocol.

## 2023-10-01 NOTE — Assessment & Plan Note (Signed)
Vitals:   10/01/23 1515 10/01/23 1600 10/01/23 1700 10/01/23 1730  BP: 108/69 121/75 (!) 135/94 (!) 134/99   10/01/23 1800 10/01/23 1900 10/01/23 1930 10/01/23 2000  BP: 134/75 137/79 136/78 (!) 149/81   10/01/23 2100 10/01/23 2130 10/01/23 2200 10/01/23 2230  BP: 124/79 112/68 107/65 114/75  Patient's amlodipine and metoprolol were continued however as blood pressure trends are low and we will currently hold.  Also will repeat a swallow study before starting p.o. meds..  As needed hydralazine is in chart if blood pressure starts going up again.

## 2023-10-01 NOTE — Assessment & Plan Note (Signed)
Troponin at 97 we will follow trend. At bedside pt denies any chest pain to me states her right great toe that has gangrene is hurting her and would like her meds and something to drink like sprite.

## 2023-10-01 NOTE — Progress Notes (Signed)
Pt transported to CT on Bipap and returned without incident. Pt removed from Bipap per MD order. RN at bedside.

## 2023-10-01 NOTE — Assessment & Plan Note (Signed)
2/2 to COPD or Cardiac etiology, troponin abnormal we will trend and follow.

## 2023-10-01 NOTE — Progress Notes (Signed)
Pt taken off bipap and placed 3lpm [place], sats 98%. Tolerating well at this time. RN made aware.

## 2023-10-01 NOTE — Significant Event (Signed)
Received message from nurse about pt being sleepy and drowsy after taking xanax that she takes at home. We held her trazodone. At bedside pt is on bipap and is opens her eyes but is extremely somnolent. We will get stat head ct noncontrast. CT chest negative.  Suspect  somnolence is from the xanax, which we will hold her xanax and trazodone.   Vitals:   10/01/23 2015 10/01/23 2100 10/01/23 2130 10/01/23 2200  BP:  124/79 112/68 107/65  Pulse: (!) 110 (!) 109 (!) 101 100  Temp:      Resp: (!) 28 (!) 40 (!) 31 (!) 34  Height:      Weight:      SpO2: 100% 100% 100% 100%  TempSrc:      BMI (Calculated):       Results for orders placed or performed during the hospital encounter of 10/01/23 (from the past 24 hour(s))  Basic metabolic panel     Status: Abnormal   Collection Time: 10/01/23  2:01 PM  Result Value Ref Range   Sodium 142 135 - 145 mmol/L   Potassium 3.6 3.5 - 5.1 mmol/L   Chloride 96 (L) 98 - 111 mmol/L   CO2 39 (H) 22 - 32 mmol/L   Glucose, Bld 115 (H) 70 - 99 mg/dL   BUN 42 (H) 8 - 23 mg/dL   Creatinine, Ser 2.95 0.44 - 1.00 mg/dL   Calcium 8.6 (L) 8.9 - 10.3 mg/dL   GFR, Estimated >62 >13 mL/min   Anion gap 7 5 - 15  CBC     Status: Abnormal   Collection Time: 10/01/23  2:01 PM  Result Value Ref Range   WBC 12.3 (H) 4.0 - 10.5 K/uL   RBC 4.23 3.87 - 5.11 MIL/uL   Hemoglobin 12.3 12.0 - 15.0 g/dL   HCT 08.6 57.8 - 46.9 %   MCV 97.4 80.0 - 100.0 fL   MCH 29.1 26.0 - 34.0 pg   MCHC 29.9 (L) 30.0 - 36.0 g/dL   RDW 62.9 52.8 - 41.3 %   Platelets 353 150 - 400 K/uL   nRBC 0.0 0.0 - 0.2 %  Resp panel by RT-PCR (RSV, Flu A&B, Covid) Anterior Nasal Swab     Status: None   Collection Time: 10/01/23  2:50 PM   Specimen: Anterior Nasal Swab  Result Value Ref Range   SARS Coronavirus 2 by RT PCR NEGATIVE NEGATIVE   Influenza A by PCR NEGATIVE NEGATIVE   Influenza B by PCR NEGATIVE NEGATIVE   Resp Syncytial Virus by PCR NEGATIVE NEGATIVE  Blood gas, arterial     Status:  Abnormal (Preliminary result)   Collection Time: 10/01/23  3:00 PM  Result Value Ref Range   pH, Arterial 7.39 7.35 - 7.45   pCO2 arterial 82 (HH) 32 - 48 mmHg   pO2, Arterial 72 (L) 83 - 108 mmHg   Bicarbonate 49.6 (H) 20.0 - 28.0 mmol/L   Acid-Base Excess 19.9 (H) 0.0 - 2.0 mmol/L   O2 Saturation 96.3 %   Patient temperature 37.0    Collection site RIGHT RADIAL    Allens test (pass/fail) PENDING PASS  Troponin I (High Sensitivity)     Status: Abnormal   Collection Time: 10/01/23  3:09 PM  Result Value Ref Range   Troponin I (High Sensitivity) 97 (H) <18 ng/L  CBG monitoring, ED     Status: Abnormal   Collection Time: 10/01/23 10:31 PM  Result Value Ref Range   Glucose-Capillary 178 (  H) 70 - 99 mg/dL  PLAN: Stat head ct noncontrast. We will get stat ABG.bipap held as pt is somnolent. Will d/w ICU app if medium flow or high flow if medium flow is appropriate.  NPO/ hold all home meds.  POCT glucose check.  Neuro checks. D/d include hypercapnia related somnolence or meds most likely we will start low rate mivf with LR at 40 cc/hour.  Indwelling foley. Consulted PCCM for AMS and hypercapnic respiratory failure.    Marland Kitchen

## 2023-10-01 NOTE — Assessment & Plan Note (Signed)
We will cont meds once pt is awake and passes swallow.

## 2023-10-01 NOTE — Assessment & Plan Note (Signed)
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 PRN pain meds.

## 2023-10-01 NOTE — ED Provider Notes (Signed)
  Physical Exam  BP 134/75   Pulse (!) 103   Temp 98.6 F (37 C) (Oral)   Resp (!) 28   Ht 5\' 4"  (1.626 m)   Wt 40 kg   SpO2 97%   BMI 15.14 kg/m   Physical Exam  Procedures  Procedures  ED Course / MDM   Clinical Course as of 10/01/23 1849  Mon Oct 01, 2023  1516 pCO2 arterial(!!): 82 Will transition to BIPAP at this time given work of breathing. Plan for admission once swab and CXR completed [DW]    Clinical Course User Index [DW] Janith Lima, MD   Medical Decision Making Amount and/or Complexity of Data Reviewed Labs: ordered. Decision-making details documented in ED Course. Radiology: ordered.  Risk Prescription drug management. Decision regarding hospitalization.   Patient is a 77 year old female initially seen by Dr. Derrill Kay for shortness of breath.  History of COPD and worsening shortness of breath over the last 24 hours.  Initial laboratory workup shows slight tachycardia and mild leukocytosis.  She was given Solu-Medrol and DuoNebs.  Patient was signed out pending VBG, troponin, respiratory panel, chest x-ray.  Follow-up shows pCO2 elevated at 82.  Patient was transition to BiPAP with improvement of her symptoms as well as given additional DuoNeb.  Chest x-ray was largely unremarkable above the patient did have elevated troponin.  Viral swabs negative.  Follow-up CTA chest was performed.  No obvious evidence of PE per my read while radiology read was still pending.  DC evidence of infiltrate in the right lower lobe.  Patient was given ceftriaxone azithromycin and admitted for COPD exacerbation.     Janith Lima, MD 10/01/23 [phone]

## 2023-10-01 NOTE — ED Notes (Signed)
This RN attempted multiple IV sticks without success. Asked another nurse to attempt

## 2023-10-01 NOTE — Assessment & Plan Note (Signed)
Due to po intake.

## 2023-10-01 NOTE — ED Notes (Signed)
Respiratory at bedside for ABG and aware of stat head CT

## 2023-10-01 NOTE — ED Notes (Signed)
EKG given to provider at this time

## 2023-10-01 NOTE — ED Notes (Signed)
This RN gave ABG results to Allena Katz, MD. MD verbal ordered removal of bipap. This RN informed Public relations account executive as well as respiratory.

## 2023-10-01 NOTE — ED Notes (Signed)
MD at bedside. 

## 2023-10-01 NOTE — ED Notes (Addendum)
Messaged provider regarding patient's decrease in LOC. Pt responsive only to painful stimuli. Request for MD to come and assess patient.

## 2023-10-01 NOTE — ED Provider Notes (Signed)
[facility] Provider Note    Event Date/Time   First MD Initiated Contact with Patient 10/01/23 1426     (approximate)   History   Shortness of Breath   HPI  [name] is a 77 y.o. female who presents to the emergency department today with shortness of breath.  Patient is a somewhat poor historian.  It is somewhat unclear how long she has been feeling more short of breath.  The patient states that she has baseline short of breath and is on 3 L of oxygen at home.  She has had some recent accompanied chest pain.  Denies any fevers.  Denies any swelling.     Physical Exam   Triage Vital Signs: ED Triage Vitals  Encounter Vitals Group     BP 10/01/23 1357 105/74     Systolic BP Percentile --      Diastolic BP Percentile --      Pulse Rate 10/01/23 1357 (!) 109     Resp 10/01/23 1357 20     Temp 10/01/23 1357 98.6 F (37 C)     Temp Source 10/01/23 1357 Oral     SpO2 10/01/23 1355 97 %     Weight 10/01/23 1359 88 lb 2.9 oz (40 kg)     Height 10/01/23 1359 5\' 4"  (1.626 m)     Head Circumference --      Peak Flow --      Pain Score 10/01/23 1358 0     Pain Loc --      Pain Education --      Exclude from Growth Chart --     Most recent vital signs: Vitals:   10/01/23 1355 10/01/23 1357  BP:  105/74  Pulse:  (!) 109  Resp:  20  Temp:  98.6 F (37 C)  SpO2: 97% 94%   General: Awake, alert CV:  Good peripheral perfusion. Tachycardia. Resp:  Increased respiratory effort. Poor air movement diffusely. Abd:  No distention.    ED Results / Procedures / Treatments   Labs (all labs ordered are listed, but only abnormal results are displayed) Labs Reviewed  BASIC METABOLIC PANEL - Abnormal; Notable for the following components:      Result Value   Chloride 96 (*)    CO2 39 (*)    Glucose, Bld 115 (*)    BUN 42 (*)    Calcium 8.6 (*)    All other components within normal limits  CBC - Abnormal; Notable for the following components:    WBC 12.3 (*)    MCHC 29.9 (*)    All other components within normal limits  RESP PANEL BY RT-PCR (RSV, FLU A&B, COVID)  [id number]  BLOOD GAS, ARTERIAL  TROPONIN I (HIGH SENSITIVITY)     EKG  I, Phineas Semen, attending physician, personally viewed and interpreted this EKG  EKG Time: 1415 Rate: 108 Rhythm: sinus tachycardia Axis: right axis deviation Intervals: qtc 471 QRS: narrow ST changes: no st elevation Impression: abnormal ekg    RADIOLOGY I independently interpreted and visualized the CXR. My interpretation: question right lower lobe opacity Radiology interpretation: Pending at time of sign out   PROCEDURES:  Critical Care performed: No   MEDICATIONS ORDERED IN ED: Medications - No data to display   IMPRESSION / MDM / ASSESSMENT AND PLAN / ED COURSE  I reviewed the triage vital signs and the nursing notes.  Differential diagnosis includes, but is not limited to, COPD, viral infection, pneumonia, CHF, ACS  Patient's presentation is most consistent with acute presentation with potential threat to life or bodily function.   The patient is on the cardiac monitor to evaluate for evidence of arrhythmia and/or significant heart rate changes.  Patient presents to the emergency department today because of concerns for shortness of breath.  On exam patient does have increased work of breathing with poor air movement diffusely.  Patient is not a perfect historian.  Will check x-ray as well as COVID swab.  Additionally will check ABG given shortness of breath.  Will give patient steroids and breathing treatments to help with possible COPD exacerbation.  Awaiting further blood work and chest x-ray at time of signout.      FINAL CLINICAL IMPRESSION(S) / ED DIAGNOSES   Shortness of breath      Note:  This document was prepared using Dragon voice recognition software and may include unintentional dictation errors.    Phineas Semen,  MD 10/01/23 [phone]

## 2023-10-02 ENCOUNTER — Inpatient Hospital Stay: Payer: Medicare HMO

## 2023-10-02 ENCOUNTER — Observation Stay: Payer: Medicare HMO

## 2023-10-02 DIAGNOSIS — F0394 Unspecified dementia, unspecified severity, with anxiety: Secondary | ICD-10-CM | POA: Diagnosis present

## 2023-10-02 DIAGNOSIS — I110 Hypertensive heart disease with heart failure: Secondary | ICD-10-CM | POA: Diagnosis present

## 2023-10-02 DIAGNOSIS — E785 Hyperlipidemia, unspecified: Secondary | ICD-10-CM | POA: Diagnosis present

## 2023-10-02 DIAGNOSIS — I70268 Atherosclerosis of native arteries of extremities with gangrene, other extremity: Secondary | ICD-10-CM | POA: Diagnosis present

## 2023-10-02 DIAGNOSIS — J9622 Acute and chronic respiratory failure with hypercapnia: Secondary | ICD-10-CM

## 2023-10-02 DIAGNOSIS — Z79890 Hormone replacement therapy (postmenopausal): Secondary | ICD-10-CM | POA: Diagnosis not present

## 2023-10-02 DIAGNOSIS — Z981 Arthrodesis status: Secondary | ICD-10-CM | POA: Diagnosis not present

## 2023-10-02 DIAGNOSIS — I2510 Atherosclerotic heart disease of native coronary artery without angina pectoris: Secondary | ICD-10-CM | POA: Diagnosis present

## 2023-10-02 DIAGNOSIS — Z1152 Encounter for screening for COVID-19: Secondary | ICD-10-CM | POA: Diagnosis not present

## 2023-10-02 DIAGNOSIS — E039 Hypothyroidism, unspecified: Secondary | ICD-10-CM | POA: Diagnosis present

## 2023-10-02 DIAGNOSIS — Z66 Do not resuscitate: Secondary | ICD-10-CM | POA: Diagnosis present

## 2023-10-02 DIAGNOSIS — E43 Unspecified severe protein-calorie malnutrition: Secondary | ICD-10-CM

## 2023-10-02 DIAGNOSIS — Z8711 Personal history of peptic ulcer disease: Secondary | ICD-10-CM | POA: Diagnosis not present

## 2023-10-02 DIAGNOSIS — L97519 Non-pressure chronic ulcer of other part of right foot with unspecified severity: Secondary | ICD-10-CM | POA: Diagnosis present

## 2023-10-02 DIAGNOSIS — G928 Other toxic encephalopathy: Secondary | ICD-10-CM | POA: Diagnosis present

## 2023-10-02 DIAGNOSIS — E0590 Thyrotoxicosis, unspecified without thyrotoxic crisis or storm: Secondary | ICD-10-CM | POA: Insufficient documentation

## 2023-10-02 DIAGNOSIS — G894 Chronic pain syndrome: Secondary | ICD-10-CM | POA: Diagnosis present

## 2023-10-02 DIAGNOSIS — F32A Depression, unspecified: Secondary | ICD-10-CM | POA: Diagnosis present

## 2023-10-02 DIAGNOSIS — R0602 Shortness of breath: Secondary | ICD-10-CM | POA: Diagnosis present

## 2023-10-02 DIAGNOSIS — J9621 Acute and chronic respiratory failure with hypoxia: Secondary | ICD-10-CM

## 2023-10-02 DIAGNOSIS — Z89421 Acquired absence of other right toe(s): Secondary | ICD-10-CM | POA: Diagnosis not present

## 2023-10-02 DIAGNOSIS — J441 Chronic obstructive pulmonary disease with (acute) exacerbation: Secondary | ICD-10-CM | POA: Diagnosis present

## 2023-10-02 DIAGNOSIS — I5032 Chronic diastolic (congestive) heart failure: Secondary | ICD-10-CM | POA: Diagnosis present

## 2023-10-02 DIAGNOSIS — Z515 Encounter for palliative care: Secondary | ICD-10-CM | POA: Diagnosis not present

## 2023-10-02 DIAGNOSIS — Z7189 Other specified counseling: Secondary | ICD-10-CM | POA: Diagnosis not present

## 2023-10-02 DIAGNOSIS — Z681 Body mass index (BMI) 19 or less, adult: Secondary | ICD-10-CM | POA: Diagnosis not present

## 2023-10-02 DIAGNOSIS — F0393 Unspecified dementia, unspecified severity, with mood disturbance: Secondary | ICD-10-CM | POA: Diagnosis present

## 2023-10-02 LAB — GLUCOSE, CAPILLARY
Glucose-Capillary: 150 mg/dL — ABNORMAL HIGH (ref 70–99)
Glucose-Capillary: 154 mg/dL — ABNORMAL HIGH (ref 70–99)

## 2023-10-02 LAB — RESPIRATORY PANEL BY PCR

## 2023-10-02 LAB — BLOOD GAS, ARTERIAL
Acid-Base Excess: 18.6 mmol/L — ABNORMAL HIGH (ref 0.0–2.0)
Bicarbonate: 46.2 mmol/L — ABNORMAL HIGH (ref 20.0–28.0)
Delivery systems: POSITIVE
Expiratory PAP: 5 cm[H2O]
FIO2: 40 %
Inspiratory PAP: 14 cm[H2O]
Mechanical Rate: 8
O2 Saturation: 96.4 %
Patient temperature: 37
pCO2 arterial: 65 mm[Hg] — ABNORMAL HIGH (ref 32–48)
pH, Arterial: 7.46 — ABNORMAL HIGH (ref 7.35–7.45)
pO2, Arterial: 71 mm[Hg] — ABNORMAL LOW (ref 83–108)

## 2023-10-02 LAB — COMPREHENSIVE METABOLIC PANEL
ALT: 14 U/L (ref 0–44)
AST: 11 U/L — ABNORMAL LOW (ref 15–41)
Albumin: 3.3 g/dL — ABNORMAL LOW (ref 3.5–5.0)
Alkaline Phosphatase: 57 U/L (ref 38–126)
Anion gap: 10 (ref 5–15)
BUN: 41 mg/dL — ABNORMAL HIGH (ref 8–23)
CO2: 37 mmol/L — ABNORMAL HIGH (ref 22–32)
Calcium: 8.5 mg/dL — ABNORMAL LOW (ref 8.9–10.3)
Chloride: 96 mmol/L — ABNORMAL LOW (ref 98–111)
Creatinine, Ser: 0.82 mg/dL (ref 0.44–1.00)
GFR, Estimated: >60 mL/min (ref >60)
Glucose, Bld: 137 mg/dL — ABNORMAL HIGH (ref 70–99)
Potassium: 3.4 mmol/L — ABNORMAL LOW (ref 3.5–5.1)
Sodium: 143 mmol/L (ref 135–145)
Total Bilirubin: 0.7 mg/dL (ref <1.2)
Total Protein: 6.4 g/dL — ABNORMAL LOW (ref 6.5–8.1)

## 2023-10-02 LAB — CBC
HCT: 34.9 % — ABNORMAL LOW (ref 36.0–46.0)
Hemoglobin: 10.5 g/dL — ABNORMAL LOW (ref 12.0–15.0)
MCH: 28.3 pg (ref 26.0–34.0)
MCHC: 30.1 g/dL (ref 30.0–36.0)
MCV: 94.1 fL (ref 80.0–100.0)
Platelets: 287 10*3/uL (ref 150–400)
RBC: 3.71 MIL/uL — ABNORMAL LOW (ref 3.87–5.11)
RDW: 13.9 % (ref 11.5–15.5)
WBC: 10.8 10*3/uL — ABNORMAL HIGH (ref 4.0–10.5)
nRBC: 0 % (ref 0.0–0.2)

## 2023-10-02 LAB — TROPONIN I (HIGH SENSITIVITY)
Troponin I (High Sensitivity): 95 ng/L — ABNORMAL HIGH (ref <18)
Troponin I (High Sensitivity): 50 ng/L — ABNORMAL HIGH (ref <18)

## 2023-10-02 LAB — STREP PNEUMONIAE URINARY ANTIGEN: Strep Pneumo Urinary Antigen: NEGATIVE

## 2023-10-02 LAB — CBG MONITORING, ED
Glucose-Capillary: 122 mg/dL — ABNORMAL HIGH (ref 70–99)
Glucose-Capillary: 120 mg/dL — ABNORMAL HIGH (ref 70–99)

## 2023-10-02 LAB — C-REACTIVE PROTEIN: CRP: 0.6 mg/dL (ref <1.0)

## 2023-10-02 LAB — TSH: TSH: 0.032 u[IU]/mL — ABNORMAL LOW (ref 0.350–4.500)

## 2023-10-02 LAB — T4, FREE: Free T4: 1.43 ng/dL — ABNORMAL HIGH (ref 0.61–1.12)

## 2023-10-02 MED ORDER — ALPRAZOLAM 0.5 MG PO TABS
1.0000 mg | ORAL_TABLET | Freq: Three times a day (TID) | ORAL | Status: AC | PRN
  Administered 2023-10-02 (×2): 1 mg via ORAL
  Filled 2023-10-02 (×3): qty 2

## 2023-10-02 MED ORDER — ENOXAPARIN SODIUM 30 MG/0.3ML IJ SOSY
30.0000 mg | PREFILLED_SYRINGE | INTRAMUSCULAR | Status: DC
  Administered 2023-10-03: 30 mg via SUBCUTANEOUS
  Filled 2023-10-02: qty 0.3

## 2023-10-02 MED ORDER — TRAZODONE HCL 50 MG PO TABS
150.0000 mg | ORAL_TABLET | Freq: Every day | ORAL | Status: DC
  Administered 2023-10-03 – 2023-10-07 (×5): 150 mg via ORAL
  Filled 2023-10-02 (×6): qty 1

## 2023-10-02 MED ORDER — OXYCODONE HCL 5 MG PO TABS
5.0000 mg | ORAL_TABLET | Freq: Three times a day (TID) | ORAL | Status: DC | PRN
  Administered 2023-10-02 – 2023-10-03 (×3): 5 mg via ORAL
  Filled 2023-10-02 (×4): qty 1

## 2023-10-02 MED ORDER — FUROSEMIDE 20 MG PO TABS
20.0000 mg | ORAL_TABLET | Freq: Every day | ORAL | Status: DC
  Administered 2023-10-02 – 2023-10-08 (×7): 20 mg via ORAL
  Filled 2023-10-02 (×7): qty 1

## 2023-10-02 MED ORDER — AMLODIPINE BESYLATE 5 MG PO TABS
5.0000 mg | ORAL_TABLET | Freq: Every day | ORAL | Status: DC
  Administered 2023-10-02 – 2023-10-08 (×7): 5 mg via ORAL
  Filled 2023-10-02 (×7): qty 1

## 2023-10-02 MED ORDER — SODIUM CHLORIDE 0.9 % IV SOLN
1.0000 g | Freq: Once | INTRAVENOUS | Status: AC
  Administered 2023-10-02: 1 g via INTRAVENOUS
  Filled 2023-10-02: qty 20

## 2023-10-02 MED ORDER — IPRATROPIUM-ALBUTEROL 0.5-2.5 (3) MG/3ML IN SOLN
3.0000 mL | Freq: Three times a day (TID) | RESPIRATORY_TRACT | Status: DC
  Administered 2023-10-02: 3 mL via RESPIRATORY_TRACT
  Filled 2023-10-02: qty 3

## 2023-10-02 MED ORDER — POTASSIUM CHLORIDE CRYS ER 20 MEQ PO TBCR
20.0000 meq | EXTENDED_RELEASE_TABLET | Freq: Once | ORAL | Status: AC
  Administered 2023-10-02: 20 meq via ORAL
  Filled 2023-10-02: qty 1

## 2023-10-02 MED ORDER — SODIUM CHLORIDE 0.9 % IV SOLN
2.0000 g | Freq: Two times a day (BID) | INTRAVENOUS | Status: DC
  Administered 2023-10-02 – 2023-10-06 (×8): 2 g via INTRAVENOUS
  Filled 2023-10-02 (×9): qty 12.5

## 2023-10-02 MED ORDER — DEXTROSE 5 % IV SOLN
500.0000 mg | INTRAVENOUS | Status: DC
  Administered 2023-10-02: 500 mg via INTRAVENOUS
  Filled 2023-10-02: qty 5

## 2023-10-02 MED ORDER — OXYCODONE-ACETAMINOPHEN 10-325 MG PO TABS: 1.0000 | ORAL_TABLET | Freq: Three times a day (TID) | ORAL | Status: DC | PRN

## 2023-10-02 MED ORDER — OXYCODONE-ACETAMINOPHEN 5-325 MG PO TABS
1.0000 | ORAL_TABLET | Freq: Three times a day (TID) | ORAL | Status: DC | PRN
  Administered 2023-10-02 – 2023-10-03 (×2): 1 via ORAL
  Filled 2023-10-02 (×3): qty 1

## 2023-10-02 MED ORDER — METOPROLOL TARTRATE 25 MG PO TABS
25.0000 mg | ORAL_TABLET | Freq: Two times a day (BID) | ORAL | Status: DC
  Administered 2023-10-02 – 2023-10-08 (×13): 25 mg via ORAL
  Filled 2023-10-02 (×13): qty 1

## 2023-10-02 MED ORDER — ADULT MULTIVITAMIN W/MINERALS CH
1.0000 | ORAL_TABLET | Freq: Every day | ORAL | Status: DC
  Administered 2023-10-03 – 2023-10-08 (×6): 1 via ORAL
  Filled 2023-10-02 (×6): qty 1

## 2023-10-02 NOTE — ED Notes (Signed)
Patient weaned off bipap and placed on 3L 

## 2023-10-02 NOTE — Progress Notes (Signed)
Physical Therapy Evaluation Patient Details Name: [name] MRN: [id number] DOB: [birth year] Today's Date: 10/02/2023  History of Present Illness  Pt is a 77 y/o F admitted on 10/01/23 after presenting with c/o SOB with intermittent chest pain. Pt is being treated with acute on chronic hypoxic hypercapnic respiratory failure. PMH: CAD, hypothyroid, advanced COPD, chronic respiratory failure on 3L O2, HLD, HTN, R toe amputations  Clinical Impression  Pt seen for PT evaluation with pt agreeable to tx. Pt is extremely HOH so PT used written communication to communicate with PT. Pt reports prior to admission she was living alone in a 1 level home with ~4 steps to enter, ambulating with a rollator, but endorsing several falls. On this date, pt is able to complete bed mobility with supervision, STS with CGA, & ambulate very short distance with RW & min assist. Pt with c/o dizziness with mobility but BP WNL, O2 increased to 4L/min to maintain SPO2 >/= 90%. Will continue to follow pt acutely to address balance, strengthening, & endurance to reduce fall risk & increase independence with mobility.  Patient will benefit from continued inpatient follow up therapy, <3 hours/day upon d/c from acute setting.      If plan is discharge home, recommend the following: A little help with walking and/or transfers;A little help with bathing/dressing/bathroom;Assistance with cooking/housework;Assist for transportation;Help with stairs or ramp for entrance   Can travel by private vehicle   Yes    Equipment Recommendations Other (comment) (defer to next venue)  Recommendations for Other Services       Functional Status Assessment Patient has had a recent decline in their functional status and demonstrates the ability to make significant improvements in function in a reasonable and predictable amount of time.     Precautions / Restrictions Precautions Precautions: Fall Restrictions Weight Bearing  Restrictions: No      Mobility  Bed Mobility Overal bed mobility: Needs Assistance Bed Mobility: Supine to Sit     Supine to sit: Supervision, HOB elevated, Used rails     General bed mobility comments: use of bed rails, HOB elevated for semi fowler to sitting EOB    Transfers Overall transfer level: Needs assistance Equipment used: Rolling walker (2 wheels) Transfers: Sit to/from Stand Sit to Stand: Contact guard assist           General transfer comment: STS from elevated stretcher    Ambulation/Gait Ambulation/Gait assistance: Min assist Gait Distance (Feet): 6 Feet Assistive device: Rolling walker (2 wheels) Gait Pattern/deviations: Decreased step length - right, Decreased step length - left, Decreased stride length Gait velocity: decreased     General Gait Details: Pt ambulates stretcher>bed with RW  Stairs            Wheelchair Mobility     Tilt Bed    Modified Rankin (Stroke Patients Only)       Balance Overall balance assessment: Needs assistance, History of Falls Sitting-balance support: Feet supported, Bilateral upper extremity supported Sitting balance-Leahy Scale: Fair     Standing balance support: During functional activity, Reliant on assistive device for balance, Bilateral upper extremity supported Standing balance-Leahy Scale: Fair                               Pertinent Vitals/Pain Pain Assessment Pain Assessment: Faces Faces Pain Scale: Hurts little more Pain Location: back, B groin, toe Pain Descriptors / Indicators: Discomfort Pain Intervention(s): Monitored during session  Home Living Family/patient expects to be discharged to:: Private residence Living Arrangements: Alone   Type of Home: House Home Access: Stairs to enter Entrance Stairs-Rails: Left (per previous chart entry) Entrance Stairs-Number of Steps: 4-5   Home Layout: One level Home Equipment: Rollator (4 wheels)      Prior Function                Mobility Comments: Pt reports she ambulates with rollator but has had several falls prior to admission. ADLs Comments: Pt reports she bathes & dresses herself, does what little cooking & cleaning gets done.     Extremity/Trunk Assessment   Upper Extremity Assessment Upper Extremity Assessment: Overall WFL for tasks assessed    Lower Extremity Assessment Lower Extremity Assessment: Generalized weakness    Cervical / Trunk Assessment Cervical / Trunk Assessment: Kyphotic  Communication   Communication Communication: Hearing impairment Cueing Techniques: Tactile cues;Gestural cues;Verbal cues;Visual cues  Cognition Arousal: Alert Behavior During Therapy: WFL for tasks assessed/performed Overall Cognitive Status: Difficult to assess                                 General Comments: Pt voices concern about locating her dog.        General Comments General comments (skin integrity, edema, etc.): Pt on 3L/min via nasal cannula at rest/beginning & end of session, increased to 4L/min with gait from stretcher>bed. SPO2 as low as 89% on 3L/min after transitioning to sitting EOB, so increased to 4L/min with movement. Pt endorses dizziness with supine>sit, BP in RUE: 121/81 mmhg MAP (92).    Exercises     Assessment/Plan    PT Assessment Patient needs continued PT services  PT Problem List Decreased strength;Cardiopulmonary status limiting activity;Decreased activity tolerance;Decreased balance;Decreased mobility;Decreased safety awareness;Decreased cognition;Decreased knowledge of use of DME       PT Treatment Interventions DME instruction;Balance training;Modalities;Gait training;Neuromuscular re-education;Stair training;Functional mobility training;Therapeutic activities;Therapeutic exercise;Patient/family education;Cognitive remediation    PT Goals (Current goals can be found in the Care Plan section)  Acute Rehab PT Goals Patient Stated Goal:  none stated PT Goal Formulation: With patient Time For Goal Achievement: 10/16/23 Potential to Achieve Goals: Good    Frequency Min 1X/week     Co-evaluation               AM-PAC PT "6 Clicks" Mobility  Outcome Measure Help needed turning from your back to your side while in a flat bed without using bedrails?: None Help needed moving from lying on your back to sitting on the side of a flat bed without using bedrails?: A Little Help needed moving to and from a bed to a chair (including a wheelchair)?: A Little Help needed standing up from a chair using your arms (e.g., wheelchair or bedside chair)?: A Little Help needed to walk in hospital room?: A Little Help needed climbing 3-5 steps with a railing? : A Lot 6 Click Score: 18    End of Session Equipment Utilized During Treatment: Oxygen Activity Tolerance: Patient limited by fatigue Patient left: in bed;with nursing/sitter in room Nurse Communication: Mobility status (O2 needs) PT Visit Diagnosis: Unsteadiness on feet (R26.81);History of falling (Z91.81);Other abnormalities of gait and mobility (R26.89);Muscle weakness (generalized) (M62.81);Difficulty in walking, not elsewhere classified (R26.2)    Time: 4098-1191 PT Time Calculation (min) (ACUTE ONLY): 18 min   Charges:   PT Evaluation $PT Eval Low Complexity: 1 Low   PT General Charges $$  ACUTE PT VISIT: 1 Visit         Aleda Grana, PT, DPT 10/02/23, 11:53 AM   Sandi Mariscal 10/02/2023, 11:52 AM

## 2023-10-02 NOTE — Evaluation (Signed)
Occupational Therapy Evaluation Patient Details Name: [name] MRN: [id number] DOB: 07-Aug-1946 Today's Date: 10/02/2023   History of Present Illness Pt is a 77 y/o F admitted on 10/01/23 after presenting with c/o SOB with intermittent chest pain. Pt is being treated with acute on chronic hypoxic hypercapnic respiratory failure. PMH: CAD, hypothyroid, advanced COPD, chronic respiratory failure on 3L O2, HLD, HTN, R toe amputations   Clinical Impression   Pt was seen for OT evaluation this date. Prior to hospital admission, pt was living alone. Reports sleeping on the couch with BSC beside her. Used a rollator for mobility, but was having increased falls and reports using the walls to keep her balance. IND with ADL performance and some IADLs as able. She had son or grandson drive her to MD appts.   Pt presents to acute OT demonstrating impaired ADL performance and functional mobility 2/2 weakness, balance deficits, decreased activity tolerance and safety awareness (See OT problem list for additional functional deficits). Pt currently requires SUP using bed rails with HOB elevated for all bed mobility. Performed x2 STS from EOB to RW with CGA with x1 LOB requiring she sit back on the bed prior to standing again. Pt refused BSC transfer and wished to return back to bed. Needed Mod A for brief management/LB dressing in standing. Nurse came in during session and pt got worked up about needing pain meds for her R toe pain, due to pt being [facility] she did not understand when the nurse would be bringing meds and became worked up. OT edu on PLB to calm her down at HR went up to 156, however as she calmed it returned to [facility]. Nurse did provide pain meds and drinks provided to pt as she is very anxious and HOH. Does admit to being tired with activity. Pt would benefit from skilled OT services to address noted impairments and functional limitations (see below for any additional details) in order to maximize safety  and independence while minimizing falls risk and caregiver burden. Do anticipate the need for follow up OT services upon acute hospital DC.       If plan is discharge home, recommend the following: A little help with walking and/or transfers;A little help with bathing/dressing/bathroom;Assistance with cooking/housework;Assist for transportation;Help with stairs or ramp for entrance    Functional Status Assessment  Patient has had a recent decline in their functional status and demonstrates the ability to make significant improvements in function in a reasonable and predictable amount of time.  Equipment Recommendations  None recommended by OT    Recommendations for Other Services       Precautions / Restrictions Precautions Precautions: Fall Restrictions Weight Bearing Restrictions: No      Mobility Bed Mobility Overal bed mobility: Needs Assistance Bed Mobility: Supine to Sit, Sit to Supine     Supine to sit: Supervision, HOB elevated, Used rails Sit to supine: Supervision, HOB elevated, Used rails   General bed mobility comments: SUP for all bed mobility using rails with HOB elevated; pt very anxious and working herself up during session as she had just moved to the floor and was requesting pain meds from nurse for her toe    Transfers Overall transfer level: Needs assistance Equipment used: Rolling walker (2 wheels) Transfers: Sit to/from Stand Sit to Stand: Contact guard assist           General transfer comment: STS from EOB with CGA to RW x2 with LOB and return to bed. Attempted BSC transfer  but pt refused      Balance Overall balance assessment: Needs assistance, History of Falls Sitting-balance support: Feet supported, Bilateral upper extremity supported Sitting balance-Leahy Scale: Fair     Standing balance support: During functional activity, Reliant on assistive device for balance, Bilateral upper extremity supported Standing balance-Leahy Scale:  Fair Standing balance comment: LOB in standing with return to sitting EOB, reliant on walker for stability with external support from therapist                           ADL either performed or assessed with clinical judgement   ADL Overall ADL's : Needs assistance/impaired Eating/Feeding: Set up;Sitting                   Lower Body Dressing: Moderate assistance;Sit to/from stand Lower Body Dressing Details (indicate cue type and reason): needed assist to pull brief up while standing at EOB               General ADL Comments: Pt declining to perform BSC transfers, but demo STS from EOB with CGA and bed mobility with SUP using rails; pt sat back on bed d/t losing her balance; likely to need Min A for ADLs at this time     Vision         Perception         Praxis         Pertinent Vitals/Pain Pain Assessment Pain Assessment: Faces Faces Pain Scale: Hurts little more Pain Location: R great toe Pain Descriptors / Indicators: Discomfort Pain Intervention(s): Limited activity within patient's tolerance, Monitored during session, RN gave pain meds during session     Extremity/Trunk Assessment Upper Extremity Assessment Upper Extremity Assessment: Overall WFL for tasks assessed   Lower Extremity Assessment Lower Extremity Assessment: Generalized weakness   Cervical / Trunk Assessment Cervical / Trunk Assessment: Kyphotic   Communication Communication Communication: Hearing impairment Cueing Techniques: Verbal cues;Tactile cues   Cognition Arousal: Alert Behavior During Therapy: WFL for tasks assessed/performed Overall Cognitive Status: Difficult to assess                                 General Comments: Pt voices concern about locating her dog.     General Comments  3L 02 hard to assess, seeminly dropped to 89% as she became worked up with nurse regarding pain meds and HR increased to 156 with edu on PLB provided and it  decreased back [facility]    Exercises Other Exercises Other Exercises: Edu on role of OT in acute setting and importance of therapy to maximize stregnth and IND.   Shoulder Instructions      Home Living Family/patient expects to be discharged to:: Private residence Living Arrangements: Alone   Type of Home: House Home Access: Stairs to enter [employer] of Steps: 4-5 Entrance Stairs-Rails: Left (per previous chart entry) Home Layout: One level     Bathroom Shower/Tub: Walk-in shower;Tub/shower unit   Bathroom Toilet: Standard Bathroom Accessibility: Yes   Home Equipment: Rollator (4 wheels);BSC/3in1;Shower seat;Wheelchair - manual          Prior Functioning/Environment               Mobility Comments: Pt reports she ambulates with rollator but has had several falls prior to admission. ADLs Comments: Pt reports she bathes & dresses herself, does what little cooking & cleaning gets done.  Has son or grandson drive her to appts        OT Problem List: Decreased strength;Decreased activity tolerance;Impaired balance (sitting and/or standing);Decreased safety awareness;Pain      OT Treatment/Interventions: Self-care/ADL training;Therapeutic exercise;Patient/family education;Balance training;Energy conservation;Therapeutic activities;DME and/or AE instruction    OT Goals(Current goals can be found in the care plan section) Acute Rehab OT Goals Patient Stated Goal: return home OT Goal Formulation: With patient Time For Goal Achievement: 10/16/23 Potential to Achieve Goals: Good ADL Goals Pt Will Perform Lower Body Bathing: with supervision;sitting/lateral leans;sit to/from stand Pt Will Perform Lower Body Dressing: with supervision;sit to/from stand;sitting/lateral leans Pt Will Transfer to Toilet: with supervision;bedside commode;ambulating Pt Will Perform Toileting - Clothing Manipulation and hygiene: with supervision;sitting/lateral leans;sit to/from  stand Additional ADL Goal #1: Pt will verbalize/demo 1 learned ECS or PLB technique to use during ADL performance and transfers to prevent overexertion and promote safety and IND upon return home.  OT Frequency: Min 1X/week    Co-evaluation              AM-PAC OT "6 Clicks" Daily Activity     Outcome Measure Help from another person eating meals?: None Help from another person taking care of personal grooming?: None Help from another person toileting, which includes using toliet, bedpan, or urinal?: A Little Help from another person bathing (including washing, rinsing, drying)?: A Little Help from another person to put on and taking off regular upper body clothing?: A Little Help from another person to put on and taking off regular lower body clothing?: A Lot 6 Click Score: 19   End of Session Equipment Utilized During Treatment: Rolling walker (2 wheels) Nurse Communication: Mobility status  Activity Tolerance: Patient tolerated treatment well Patient left: in bed;with call bell/phone within reach;with bed alarm set  OT Visit Diagnosis: Other abnormalities of gait and mobility (R26.89);Unsteadiness on feet (R26.81);Repeated falls (R29.6)                Time: 1055-1130 OT Time Calculation (min): 35 min Charges:  OT General Charges $OT Visit: 1 Visit OT Evaluation $OT Eval Moderate Complexity: 1 Mod OT Treatments $Therapeutic Activity: 8-22 mins Bradlee Bridgers, OTR/L 10/02/23, 1:24 PM Deshanta Lady E Robley [place] 10/02/2023, 1:19 PM

## 2023-10-02 NOTE — Consult Note (Addendum)
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   ED Course: Initial vital signs showed HR of 109 beats/minute, BP 105/11mm Hg, the RR 20 breaths/minute, and the oxygen saturation 97% on 4L and a temperature of 98.21F (37C).  In the ED she was noted with increased work of breathing with poor air movement diffusely.  She was placed on BiPAP and treated with steroids and DuoNebs.   Pertinent Labs/Diagnostics Findings: CO2:39, Glucose:114 BUN/Cr.42/0.82 WBC:12.3  K/L without bands or neutrophil predominance   COVID PCR: Negative,  troponin: 95  ABG: pO2 72; pCO2 82; pH 7.39;  HCO3 49.6, %O2 Sat 96.3.  Imaging: CXR>neg CTH>neg CTA Chest> Negative for PE Medication administered in the ED: DuoNebs, Solu-Medrol, ceftriaxone and azithromycin Disposition: Admitted to hospitalist service for COPD exacerbation.  Patient required Xanax for agitation shortly after became obtunded.  Repeat ABG unchanged, CT head negative.  PCCM consulted.  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   Patient became altered and obtunded following Xanax administration. PCCM consulted due to high risk for intubation.  Consults:  PCCM  Procedures:  None  Significant Diagnostic Tests:  11/11: Chest Xray>  IMPRESSION: No active disease.  11/11: Noncontrast CT head>   11/11: CTA Chest> IMPRESSION: 1. No evidence of acute pulmonary embolism or other acute thoracic findings. 2. Interval improved aeration of the right lower lobe. No residual focal airspace disease to suggest pneumonia. 3. Unchanged mild mediastinal and right hilar adenopathy, likely reactive. 4. Unchanged diffuse esophageal wall thickening, likely esophagitis. 5. Aortic Atherosclerosis (ICD10-I70.0) and Emphysema (ICD10-J43.9).  Interim History / Subjective:      Micro Data:  11/11: SARS-CoV-2 PCR> negative 11/11: Blood culture x2> 11/11: MRSA PCR>>  :11/11 Strep pneumo urinary antigen>  Antimicrobials:  Azithromycin 11/11> Ceftriaxone 11/11>  OBJECTIVE  Blood pressure 109/70, pulse 90, temperature 98.7 F (37.1 C), temperature source Oral, resp. rate (!) 26, height 5\' 4"  (1.626 m), weight 40 kg, SpO2 95%.    FiO2 (%):  [40 %] 40 %   Intake/Output Summary (Last 24 hours) at 10/02/2023 0127 Last data filed at 10/01/2023 2159 Gross per 24 hour  Intake 350 ml  Output --  Net 350 ml   Filed Weights   10/01/23 1359  Weight: 40 kg   Physical Examination  GENERAL: 77 year-old critically ill patient lying in the bed  EYES: PEERLA. No scleral icterus. Extraocular muscles intact.  HEENT: Head atraumatic, normocephalic. Oropharynx and nasopharynx clear.  NECK:  No JVD, supple  LUNGS: Decreased breath sounds bilaterally.  No use of accessory muscles of respiration.  CARDIOVASCULAR: S1, S2 normal. No murmurs,  rubs, or gallops.  ABDOMEN: Soft, NTND EXTREMITIES: No swelling or erythema.  Capillary refill >3 sec in all extremities. Right 5th and 2nd toe amputation. Pulses palpable distally. NEUROLOGIC:  The patient is on BiPAP . No focal neurological deficit appreciated. Cranial nerves are intact.  SKIN: No obvious rash, lesion, or ulcer. Warm to touch Labs/imaging that I havepersonally reviewed  (right click and "Reselect all SmartList Selections" daily)     Labs   CBC: Recent Labs  Lab 10/01/23 1401  WBC 12.3*  HGB 12.3  HCT 41.2  MCV 97.4  PLT 353    Basic Metabolic Panel: Recent Labs  Lab 10/01/23 1401  NA 142  K 3.6  CL 96*  CO2 39*  GLUCOSE 115*  BUN 42*  CREATININE 0.82  CALCIUM 8.6*   GFR: Estimated Creatinine Clearance: 36.9 mL/min (by C-G formula based on SCr of 0.82 mg/dL). Recent Labs  Lab 10/01/23 1401  WBC 12.3*    Liver Function Tests: No results for input(s): "AST", "ALT", "ALKPHOS", "BILITOT", "PROT", "ALBUMIN" in the last 168 hours. No results for input(s): "LIPASE", "AMYLASE" in the last 168 hours. No results for input(s): "AMMONIA" in the last 168 hours.  ABG    Component Value Date/Time   PHART 7.37 10/01/2023 2239   PCO2ART 82 (HH) 10/01/2023 2239   PO2ART 65 (L) 10/01/2023 2239   HCO3 47.4 (H) 10/01/2023 2239   TCO2 26 12/07/2011 1042   ACIDBASEDEF 6.6 (H) 02/24/2023 1207   O2SAT 93.8 10/01/2023 2239     Coagulation Profile: No results for input(s): "INR", "PROTIME" in the last 168 hours.  Cardiac Enzymes: No results for input(s): "CKTOTAL", "CKMB", "CKMBINDEX", "TROPONINI" in the last 168 hours.  HbA1C: No results found for: "HGBA1C"  CBG: Recent Labs  Lab 10/01/23 2231 10/01/23 2348  GLUCAP 178* 157*    Review of Systems:   Unable to be obtained secondary to the patient's altered mental status.   Past Medical History  She,  has a past medical history of Anxiety, CAD (coronary artery disease), Carpal tunnel syndrome, Cataract, Complication of anesthesia, COPD (chronic obstructive pulmonary disease) (HCC), CRP elevated (09/14/2015), Depression, Difficult intubation, Dyslipidemia, Dyspnea, Dysrhythmia, Elevated  sedimentation rate (09/14/2015), Esophageal spasm, Gastrointestinal parasites, GERD (gastroesophageal reflux disease), HCAP (healthcare-associated pneumonia) (10/22/2018), Hiatal hernia, History of peptic ulcer disease, Hypertension, Hyperthyroidism, Hypothyroidism, Low magnesium levels (09/14/2015), Nicotine dependence (09/14/2015), Pelvic fracture (HCC) (2008), Peripheral vascular disease (HCC), Reflux, Rotator cuff injury, Sepsis (HCC) (10/22/2018), and Stenosis, spinal, lumbar.   Surgical History    Past Surgical History:  Procedure Laterality Date   AMPUTATION TOE Right 12/26/2020   Procedure: AMPUTATION TOE-3rd Toes;  Surgeon: Gwyneth Revels, DPM;  Location: [facility];  Service: Podiatry;  Laterality: Right;   AMPUTATION TOE Right 04/22/2021   Procedure: AMPUTATION TOE- RIGHT 2ND;  Surgeon: Gwyneth Revels, DPM;  Location: [facility];  Service: Podiatry;  Laterality: Right;   AMPUTATION TOE Right 10/21/2021   Procedure: AMPUTATION TOE;  Surgeon: Rosetta Posner, DPM;  Location: [facility];  Service: Podiatry;  Laterality: Right;   AMPUTATION TOE Right 11/23/2021   Procedure: AMPUTATION TOE;  Surgeon: Linus Galas, DPM;  Location: [facility];  Service: Podiatry;  Laterality: Right;   AMPUTATION TOE Right 07/05/2022   Procedure: AMPUTATION RIGHT 5TH TOE;  Surgeon: Edwin Cap, DPM;  Location: [facility];  Service: Podiatry;  Laterality: Right;   APPENDECTOMY     BACK SURGERY     CERVICAL FUSION   CARPAL TUNNEL RELEASE     CATARACT  EXTRACTION W/PHACO Right 08/07/2017   Procedure: CATARACT EXTRACTION PHACO AND INTRAOCULAR LENS PLACEMENT (IOC);  Surgeon: Galen Manila, MD;  Location: [facility];  Service: Ophthalmology;  Laterality: Right;  [place] 00:52.0 AP% 16.8 CDE 8.74 Fluid Pack Lot # [id number]   CATARACT EXTRACTION W/PHACO Left 09/04/2017   Procedure: CATARACT EXTRACTION PHACO AND INTRAOCULAR LENS PLACEMENT (IOC);  Surgeon: Galen Manila, MD;  Location: [facility];  Service: Ophthalmology;   Laterality: Left;  [place] 00:34 AP% 17.0 CDE 5.80 Fluid pack lot # [id number]   CESAREAN SECTION     CHOLECYSTECTOMY     FRACTURE SURGERY     HIP SURGERY     INTRAMEDULLARY (IM) NAIL INTERTROCHANTERIC Right 10/01/2016   Procedure: INTRAMEDULLARY (IM) NAIL INTERTROCHANTRIC;  Surgeon: Christena Flake, MD;  Location: [facility];  Service: Orthopedics;  Laterality: Right;   INTRAMEDULLARY (IM) NAIL INTERTROCHANTERIC Left 01/14/2021   Procedure: INTRAMEDULLARY (IM) NAIL INTERTROCHANTRIC;  Surgeon: Lyndle Herrlich, MD;  Location: [facility];  Service: Orthopedics;  Laterality: Left;   KYPHOPLASTY N/A 08/20/2018   Procedure: [id number];  Surgeon: Kennedy Bucker, MD;  Location: [facility];  Service: Orthopedics;  Laterality: N/A;   LOWER EXTREMITY ANGIOGRAPHY Right 12/18/2019   Procedure: LOWER EXTREMITY ANGIOGRAPHY;  Surgeon: Annice Needy, MD;  Location: [facility];  Service: Cardiovascular;  Laterality: Right;   LOWER EXTREMITY ANGIOGRAPHY Right 12/02/2020   Procedure: LOWER EXTREMITY ANGIOGRAPHY;  Surgeon: Annice Needy, MD;  Location: [facility];  Service: Cardiovascular;  Laterality: Right;   LOWER EXTREMITY ANGIOGRAPHY Right 12/23/2020   Procedure: Lower Extremity Angiography;  Surgeon: Annice Needy, MD;  Location: [facility];  Service: Cardiovascular;  Laterality: Right;   LOWER EXTREMITY ANGIOGRAPHY Right 12/24/2020   Procedure: Lower Extremity Angiography;  Surgeon: Annice Needy, MD;  Location: [facility];  Service: Cardiovascular;  Laterality: Right;   LOWER EXTREMITY ANGIOGRAPHY Right 10/20/2021   Procedure: Lower Extremity Angiography;  Surgeon: Annice Needy, MD;  Location: [facility];  Service: Cardiovascular;  Laterality: Right;   LOWER EXTREMITY ANGIOGRAPHY Right 07/03/2022   Procedure: Lower Extremity Angiography;  Surgeon: Annice Needy, MD;  Location: [facility];  Service: Cardiovascular;  Laterality: Right;   NECK SURGERY     NOSE SURGERY      ROTATOR CUFF REPAIR     x2   SHOULDER ARTHROSCOPY  12/07/2011   Procedure: ARTHROSCOPY SHOULDER;  Surgeon: Loreta Ave, MD;  Location: [facility];  Service: Orthopedics;  Laterality: Right;  Debridement Partial Cuff Tear, Release Coracoacromial Ligament   SHOULDER SURGERY  12/07/2011   right     Social History   reports that she has been smoking cigarettes. She has never used smokeless tobacco. She reports that she does not drink alcohol and does not use drugs.   Family History   Her family history includes Alcoholism in her brother; Aneurysm in an other family member.   Allergies No Known Allergies   Home Medications  Prior to Admission medications   Medication Sig Start Date End Date Taking? Authorizing Provider  ALPRAZolam Prudy Feeler) 1 MG tablet Take 1 mg by mouth 3 (three) times daily as needed. 09/20/23  Yes [provider]  COMBIVENT RESPIMAT 20-100 MCG/ACT AERS respimat Inhale 1-2 puffs into the lungs in the morning, at noon, in the evening, and at bedtime. 09/03/23  Yes [provider]  furosemide (LASIX) 20 MG tablet Take 20 mg by mouth daily. 07/20/23  Yes  [provider]  albuterol (VENTOLIN HFA) 108 (90 Base) MCG/ACT inhaler Inhale 1-2 puffs into the lungs every 4 (four) hours as needed for shortness of breath or wheezing. 06/28/22   [provider]  amLODipine (NORVASC) 5 MG tablet Take 1 tablet (5 mg total) by mouth daily. 05/10/23   Leeroy Bock, MD  ammonium lactate (LAC-HYDRIN) 12 % lotion APPLY TO AFFECTED AREA AS NEEDED FOR DRY SKIN 08/09/23   Candelaria Stagers, DPM  ASPIRIN LOW DOSE 81 MG tablet TAKE 1 TABLET BY MOUTH EVERY DAY 05/07/23   Georgiana Spinner, NP  atorvastatin (LIPITOR) 10 MG tablet Take 1 tablet (10 mg total) by mouth daily. Patient not taking: Reported on 05/06/2023 11/10/22 11/10/23  Arnetha Courser, MD  benzonatate (TESSALON) 100 MG capsule Take 1 capsule (100 mg total) by mouth 3 (three) times daily as  needed for cough. 03/02/23   Gillis Santa, MD  feeding supplement (ENSURE ENLIVE / ENSURE PLUS) LIQD Take 237 mLs by mouth 3 (three) times daily between meals. 02/03/23   Darlin Priestly, MD  fluticasone-salmeterol (ADVAIR) 250-50 MCG/ACT AEPB Inhale 1 puff into the lungs 2 (two) times daily as needed.    [provider]  hydrOXYzine (ATARAX) 25 MG tablet Take 1 tablet (25 mg total) by mouth 3 (three) times daily as needed for anxiety. 03/02/23   Gillis Santa, MD  ipratropium-albuterol (DUONEB) 0.5-2.5 (3) MG/3ML SOLN Take 3 mLs by nebulization every 6 (six) hours as needed.    [provider]  iron polysaccharides (NIFEREX) 150 MG capsule Take 1 capsule (150 mg total) by mouth daily. 03/03/23 06/01/23  Gillis Santa, MD  levothyroxine (SYNTHROID) 50 MCG tablet Take 50 mcg by mouth every morning.    [provider]  melatonin 5 MG TABS Take 1 tablet (5 mg total) by mouth at bedtime. 03/02/23 03/01/24  Gillis Santa, MD  metoprolol tartrate (LOPRESSOR) 25 MG tablet Take 1 tablet (25 mg total) by mouth 2 (two) times daily. Skip the dose if systolic BP less than 130 mmHg and/or heart rate less than 65 beats per minute 03/02/23 08/29/23  Gillis Santa, MD  Multiple Vitamin (MULTIVITAMIN WITH MINERALS) TABS tablet Take 1 tablet by mouth daily. 11/10/22   Arnetha Courser, MD  omeprazole (PRILOSEC) 40 MG capsule Take 40 mg by mouth daily.    [provider]  oxyCODONE-acetaminophen (PERCOCET) 10-325 MG tablet Take 1 tablet by mouth every 4 (four) hours as needed for pain. 07/03/23   Candelaria Stagers, DPM  polyethylene glycol (MIRALAX / GLYCOLAX) 17 g packet Take 17 g by mouth daily as needed. 05/16/23   Lolita Patella B, MD  potassium chloride SA (KLOR-CON M) 20 MEQ tablet Take 20 mEq by mouth daily.    [provider]  risperiDONE (RISPERDAL) 0.25 MG tablet Take 1 tablet (0.25 mg total) by mouth at bedtime. 03/02/23 05/31/23  Gillis Santa, MD  thiamine (VITAMIN B-1) 100 MG tablet  Take 1 tablet (100 mg total) by mouth daily. 11/10/22   Arnetha Courser, MD  traZODone (DESYREL) 150 MG tablet Take 300 mg by mouth at bedtime.    [provider]  Scheduled Meds:  aspirin EC  81 mg Oral Daily   atorvastatin  10 mg Oral Daily   feeding supplement  237 mL Oral TID BM   heparin  5,000 Units Subcutaneous Q8H   ipratropium-albuterol  3 mL Nebulization Q6H   iron polysaccharides  150 mg Oral Daily   levothyroxine  50 mcg Oral  q morning   methylPREDNISolone (SOLU-MEDROL) injection  60 mg Intravenous Q12H   Followed by   Melene Muller ON 10/03/2023] predniSONE  40 mg Oral Q breakfast   nicotine  14 mg Transdermal Daily   pantoprazole  40 mg Oral Daily   risperiDONE  0.25 mg Oral QHS   sodium chloride flush  3 mL Intravenous Q12H   thiamine  100 mg Oral Daily   Continuous Infusions:  cefTRIAXone (ROCEPHIN)  IV     lactated ringers 40 mL/hr at 10/01/23 2253   PRN Meds:.acetaminophen **OR** acetaminophen, bisacodyl, guaiFENesin, hydrALAZINE, ipratropium-albuterol, ondansetron **OR** ondansetron (ZOFRAN) IV, oxyCODONE-acetaminophen **AND** oxyCODONE, polyethylene glycol  Active Hospital Problem list   See systems below  Assessment & Plan:  #Acute on Chronic Hypoxic and Hypercapnic Respiratory Failure #Acute Exacerbation of COPD Hx of COPD on 2-3L at baseline, former smoker on triple therapy at home. Now with hypoxic respiratory failure requiring BiPAP therapy.  CTA negative for PE.   -Supplemental O2 as needed to maintain O2 saturations 88 to 92% -BiPAP, wean as tolerated -High risk for intubation, although mentation could have been sedative induced, will place back on BiPAP and give her mentation time to clear up before opting for intubation -Follow intermittent Chest X-ray & ABG as needed -Obtain strep pneumo antigen -Bronchodilators and Pulmicort nebs -Short course systemic steroids -Antibiotics as below  #Acute Metabolic Encephalopathy Hx of Dementia with  Behavioral Disturbances -Treat underlying hypercapnia, this should improve with BiPAP -CTH negative -Hold sedatives including risperidone. Received xanax in the ED causing obtundation   #Leukocytosis Initial interventions/workup included: 2 L of NS/LR & Cefepime/ Vancomycin/ Azithromycin Does not meet sepsis criteria -F/u cultures, trend lactic/ PCT -Monitor WBC/ fever curve -IV antibiotics Ceftriaxone AND Azithromycin for now may de-escalate as appropriate -Gentle IVF hydration as needed -Strict I/O's  #CAD  #HTN,HLD #Mildly Elevated Troponin -Trend troponin until peaked -Continue Aspirin 81 mg -Atorvastatin 10mg  PO qhs   #PAD/PVD Hx of Osteomyelitis/Gangrene s/p right 5th digit,  right 2nd toe amputation Former smoker -Followed by vascular and Podiatry -Continue Aspirin as above  #Hypothyroidism -Check TSH, Free T4 -Continue Synthroid pending labs  #Anxiety and Depression -On Xanax , Hydroxyzine and Trazodone at home -Hold sedatives for now due to altered mental status  #Chronic Pain Syndrome -Hold Narcotics for now until mentation improves   Best practice:  Diet:  NPO Pain/Anxiety/Delirium protocol (if indicated): No VAP protocol (if indicated): Not indicated DVT prophylaxis: Subcutaneous Heparin GI prophylaxis: PPI Glucose control:  SSI No Central venous access:  N/A Arterial line:  N/A Foley:  Yes, and it is still needed Mobility:  bed rest  PT consulted: N/A Last date of multidisciplinary goals of care discussion [UTA] Code Status:  full code Disposition: Stepdown   = Goals of Care = Code Status Order: FULL  Primary Emergency Contact: Poucher,[name] Wishes to pursue full aggressive treatment and intervention options, including CPR and intubation, but goals of care will be addressed on going with family if that should become necessary.   Critical care time: 45 minutes        Webb Silversmith DNP, CCRN, FNP-C, AGACNP-BC Acute Care & Family Nurse  Practitioner [facility] PCCM on call pager [phone]

## 2023-10-02 NOTE — ED Notes (Signed)
Attempted to call Rushie Chestnut with update, no answer

## 2023-10-02 NOTE — Progress Notes (Addendum)
PROGRESS NOTE    [name] [facility]  ZOX:[id number] DOB: [birth year] DOA: 10/01/2023 PCP: [name], MD  Outpatient Specialists: vascular, pulm    Brief Narrative:   From admission h and p [name] is a 77 y.o. female with medical history significant for CAD, hypothyroid, advanced COPD seen by pulmonary [name], chronic respiratory failure with 3 L of oxygen nasal cannula at home, hyperlipidemia, hypertension, who presents to the emergency department for sob that started acutely today with hypoxia/ hypercapnia on bipap, with reports of intermittent chest pain currently with elevated troponin of 97 and EKG showing regular fast rhythm which is most likely atrial flutter repeat EKG is pending, CTA chest is ordered as patient is high risk for VTE with her history of reduction internal fixation and displaced intertrochanteric right hip fracture, also history of PAD followed by vascular Dr. [name] and history of amputations of the second, third and fourth MTP, chest xray is negative  with white count of  12.3. Admission requested for suspected COPD exacerbation. In emergency room vitals trend shows patient meeting SIRS criteria infection is likely but not suspected.   Assessment & Plan:   Principal Problem:   SOB (shortness of breath) Active Problems:   Chronic pain syndrome   Acute on chronic respiratory failure with hypoxia and hypercapnia (HCC)   CAD (coronary artery disease)   Hypertension   Underweight   Hypothyroidism   Protein-calorie malnutrition, severe (HCC)   Opiate use   PAD (peripheral artery disease) (HCC)   COPD with acute exacerbation (HCC)   Hyperthyroidism  # Acute on chronic hypoxic hypercapnic respiratory failure With elevated co2 and encephalopathy overnight, required bipap, now weaned back to baseline 3 liters and no tachypnea - continue [place] o2  # COPD, severe, with acute exacerbation No PE or focal infiltrate on CXR. Pseudomonas respiratory culture  last year. Pulm is following - continue steroids and breathing treatments - rph is determining abx, just got a dose of meropenem - f/u rvp and urine antigens  # PAD # Prior right toe amputations Has ulcer on dorsum of right first toe - check ABIs and x-ray and CRP - UPDATE 11/12 14:20: nursing reports patient refused to go to ultrasound for ABIs  # Foley catheter Per nursing placed last night per MD order but reason why is unclear, no report of retention - d/c Foley and monitor urine output  # CAD # Tropinemia Denies chest pain, trops mildly elevated and flat, no ischemic changes on EKG. Suspect demand. Had relatively benign TTE in April of this year - monitor - home aspirin, statin, metop  # HFpEF Appears euvolemic - home metop, lasix  # GAD Home xanax  # Dementia With history behavioral disturbance - delirium precautions - home trazodone, risperdal (but will lower trazodone dose from 300 to 150)  # Chronic pain - cont home percocet  # HTN Mild elevation today - resume home amlodipine, metop  # Thyroid Carries diagnosis of both hypothyroidism and hyperthyroidism. Currently on levothyroxine. TSH is low and t4 is elevated - advise holding levothyroxine and repeating TFTs outpatient in 4-6 wks  # Debility PT consult  # Severe malnutrition - RD consult   DVT prophylaxis: lovenox Code Status: full, confirmed w/ patient at bedside Family Communication: none at bedside, no answer when grandson and emergency contact [name] telephoned today  Level of care: Progressive    Consultants:  pulm  Procedures: none  Antimicrobials:  See above    Subjective: Reports dyspnea  improved, no chest pain, wants her home xanax  Objective: Vitals:   10/02/23 0715 10/02/23 0730 10/02/23 0745 10/02/23 0800  BP: 125/63 (!) 158/72 (!) 162/75 (!) 150/78  Pulse: 78 91 94 (!) 101  Resp: (!) 23 (!) 24 (!) 23 (!) 21  Temp:      TempSrc:      SpO2: 100% 95% 100% 99%   Weight:      Height:        Intake/Output Summary (Last 24 hours) at 10/02/2023 2595 Last data filed at 10/01/2023 2159 Gross per 24 hour  Intake 350 ml  Output --  Net 350 ml   Filed Weights   10/01/23 1359  Weight: 40 kg    Examination:  General exam: Appears calm and comfortable, chronically ill appearing and malnourished Respiratory system: exp wheeze, scattered rhonchi Cardiovascular system: S1 & S2 heard, RRR.   Gastrointestinal system: Abdomen is nondistended, soft and nontender.  Central nervous system: Alert and oriented. No focal neurological deficits. Hard of hearing Extremities: Symmetric 5 x 5 power. No edema. Non-palpable pulses. Skin: ulcer dorsum of right first toe Psychiatry: a bit confused    Data Reviewed: I have personally reviewed following labs and imaging studies  CBC: Recent Labs  Lab 10/01/23 1401 10/02/23 0438  WBC 12.3* 10.8*  HGB 12.3 10.5*  HCT 41.2 34.9*  MCV 97.4 94.1  PLT 353 287   Basic Metabolic Panel: Recent Labs  Lab 10/01/23 1401 10/02/23 0438  NA 142 143  K 3.6 3.4*  CL 96* 96*  CO2 39* 37*  GLUCOSE 115* 137*  BUN 42* 41*  CREATININE 0.82 0.82  CALCIUM 8.6* 8.5*   GFR: Estimated Creatinine Clearance: 36.9 mL/min (by C-G formula based on SCr of 0.82 mg/dL). Liver Function Tests: Recent Labs  Lab 10/02/23 0438  AST 11*  ALT 14  ALKPHOS 57  BILITOT 0.7  PROT 6.4*  ALBUMIN 3.3*   No results for input(s): "LIPASE", "AMYLASE" in the last 168 hours. No results for input(s): "AMMONIA" in the last 168 hours. Coagulation Profile: No results for input(s): "INR", "PROTIME" in the last 168 hours. Cardiac Enzymes: No results for input(s): "CKTOTAL", "CKMB", "CKMBINDEX", "TROPONINI" in the last 168 hours. BNP (last 3 results) No results for input(s): "PROBNP" in the last 8760 hours. HbA1C: No results for input(s): "HGBA1C" in the last 72 hours. CBG: Recent Labs  Lab 10/01/23 2231 10/01/23 2348 10/02/23 0436   GLUCAP 178* 157* 122*   Lipid Profile: No results for input(s): "CHOL", "HDL", "LDLCALC", "TRIG", "CHOLHDL", "LDLDIRECT" in the last 72 hours. Thyroid Function Tests: Recent Labs    10/01/23 1401  TSH 0.032*  FREET4 1.43*   Anemia Panel: No results for input(s): "VITAMINB12", "FOLATE", "FERRITIN", "TIBC", "IRON", "RETICCTPCT" in the last 72 hours. Urine analysis:    Component Value Date/Time   COLORURINE YELLOW (A) 07/05/2023 1141   APPEARANCEUR CLEAR (A) 07/05/2023 1141   LABSPEC 1.021 07/05/2023 1141   PHURINE 5.0 07/05/2023 1141   GLUCOSEU NEGATIVE 07/05/2023 1141   HGBUR NEGATIVE 07/05/2023 1141   BILIRUBINUR NEGATIVE 07/05/2023 1141   KETONESUR NEGATIVE 07/05/2023 1141   PROTEINUR NEGATIVE 07/05/2023 1141   NITRITE NEGATIVE 07/05/2023 1141   LEUKOCYTESUR NEGATIVE 07/05/2023 1141   Sepsis Labs: @LABRCNTIP (procalcitonin:4,lacticidven:4)  ) Recent Results (from the past 240 hour(s))  Resp panel by RT-PCR (RSV, Flu A&B, Covid) Anterior Nasal Swab     Status: None   Collection Time: 10/01/23  2:50 PM   Specimen: Anterior Nasal Swab  Result  Value Ref Range Status   SARS Coronavirus 2 by RT PCR NEGATIVE NEGATIVE Final    Comment: (NOTE) SARS-CoV-2 target nucleic acids are NOT DETECTED.  The SARS-CoV-2 RNA is generally detectable in upper respiratory specimens during the acute phase of infection. The lowest concentration of SARS-CoV-2 viral copies this assay can detect is 138 copies/mL. A negative result does not preclude SARS-Cov-2 infection and should not be used as the sole basis for treatment or other patient management decisions. A negative result may occur with  improper specimen collection/handling, submission of specimen other than nasopharyngeal swab, presence of viral mutation(s) within the areas targeted by this assay, and inadequate number of viral copies(<138 copies/mL). A negative result must be combined with clinical observations, patient history, and  epidemiological information. The expected result is Negative.  Fact Sheet for Patients:  BloggerCourse.com  Fact Sheet for Healthcare Providers:  SeriousBroker.it  This test is no t yet approved or cleared by the [place] FDA and  has been authorized for detection and/or diagnosis of SARS-CoV-2 by FDA under an Emergency Use Authorization (EUA). This EUA will remain  in effect (meaning this test can be used) for the duration of the COVID-19 declaration under Section 564(b)(1) of the Act, 21 U.S.C.section 360bbb-3(b)(1), unless the authorization is terminated  or revoked sooner.       Influenza A by PCR NEGATIVE NEGATIVE Final   Influenza B by PCR NEGATIVE NEGATIVE Final    Comment: (NOTE) The Xpert Xpress SARS-CoV-2/FLU/RSV plus assay is intended as an aid in the diagnosis of influenza from Nasopharyngeal swab specimens and should not be used as a sole basis for treatment. Nasal washings and aspirates are unacceptable for Xpert Xpress SARS-CoV-2/FLU/RSV testing.  Fact Sheet for Patients: BloggerCourse.com  Fact Sheet for Healthcare Providers: SeriousBroker.it  This test is not yet approved or cleared by the [place] FDA and has been authorized for detection and/or diagnosis of SARS-CoV-2 by FDA under an Emergency Use Authorization (EUA). This EUA will remain in effect (meaning this test can be used) for the duration of the COVID-19 declaration under Section 564(b)(1) of the Act, 21 U.S.C. section 360bbb-3(b)(1), unless the authorization is terminated or revoked.     Resp Syncytial Virus by PCR NEGATIVE NEGATIVE Final    Comment: (NOTE) Fact Sheet for Patients: BloggerCourse.com  Fact Sheet for Healthcare Providers: SeriousBroker.it  This test is not yet approved or cleared by the [place] FDA and has been  authorized for detection and/or diagnosis of SARS-CoV-2 by FDA under an Emergency Use Authorization (EUA). This EUA will remain in effect (meaning this test can be used) for the duration of the COVID-19 declaration under Section 564(b)(1) of the Act, 21 U.S.C. section 360bbb-3(b)(1), unless the authorization is terminated or revoked.  Performed at [facility], [address] [place], [place] [address]          Radiology Studies: CT Head Wo Contrast  Result Date: 10/02/2023 CLINICAL DATA:  Altered mental status. EXAM: CT HEAD WITHOUT CONTRAST TECHNIQUE: Contiguous axial images were obtained from the base of the skull through the vertex without intravenous contrast. RADIATION DOSE REDUCTION: This exam was performed according to the departmental dose-optimization program which includes automated exposure control, adjustment of the mA and/or kV according to patient size and/or use of iterative reconstruction technique. COMPARISON:  February 24, 2023 FINDINGS: Brain: There is mild cerebral atrophy with widening of the extra-axial spaces and stable ventricular dilatation. There are areas of decreased attenuation within the white matter tracts  of the supratentorial brain, consistent with microvascular disease changes. Vascular: Marked severity bilateral cavernous carotid artery calcification is noted. Skull: Normal. Negative for fracture or focal lesion. Sinuses/Orbits: No acute finding. Other: None. IMPRESSION: 1. Generalized cerebral atrophy with chronic white matter small vessel ischemic changes. 2. No acute intracranial abnormality. Electronically Signed   By: Aram Candela M.D.   On: 10/02/2023 03:45   CT Angio Chest PE W and/or Wo Contrast  Result Date: 10/01/2023 CLINICAL DATA:  Shortness of breath with hypoxia. COPD exacerbation. Clinical concern for pulmonary embolism. EXAM: CT ANGIOGRAPHY CHEST WITH CONTRAST TECHNIQUE: Multidetector CT imaging of the chest was performed using  the standard protocol during bolus administration of intravenous contrast. Multiplanar CT image reconstructions and MIPs were obtained to evaluate the vascular anatomy. RADIATION DOSE REDUCTION: This exam was performed according to the departmental dose-optimization program which includes automated exposure control, adjustment of the mA and/or kV according to patient size and/or use of iterative reconstruction technique. CONTRAST:  75mL OMNIPAQUE IOHEXOL 350 MG/ML SOLN COMPARISON:  Radiographs 10/01/2023 and 07/05/2023. Chest CTA 01/30/2023 and 01/13/2021. FINDINGS: Cardiovascular: The pulmonary arteries are well opacified with contrast to the level of the segmental branches. There is no evidence of acute pulmonary embolism. Atherosclerosis of the aorta, great vessels and coronary arteries without evidence of acute systemic arterial abnormality. The heart size is normal. There is no pericardial effusion. Mediastinum/Nodes: Unchanged mild mediastinal and right hilar adenopathy. Unchanged diffuse esophageal wall thickening. The thyroid gland and trachea appear unremarkable. Lungs/Pleura: No pleural effusion or pneumothorax. Moderate to severe centrilobular emphysema with diffuse central airway thickening. Interval improved aeration of the right lower lobe. No residual focal airspace disease to suggest pneumonia. Upper abdomen: The visualized upper abdomen appears unchanged. There is chronic extrahepatic biliary dilatation status post cholecystectomy. Stable chronic nodular thickening of the adrenal glands and bilateral renal cysts, for which no specific follow-up imaging is recommended. Musculoskeletal/Chest wall: There is no chest wall mass or suspicious osseous finding. Multiple thoracic compression deformities are unchanged from the most recent prior study. Previous cervicothoracic fusion. Unless specific follow-up recommendations are mentioned in the findings or impression sections, no imaging follow-up of any  mentioned incidental findings is recommended. Review of the MIP images confirms the above findings. IMPRESSION: 1. No evidence of acute pulmonary embolism or other acute thoracic findings. 2. Interval improved aeration of the right lower lobe. No residual focal airspace disease to suggest pneumonia. 3. Unchanged mild mediastinal and right hilar adenopathy, likely reactive. 4. Unchanged diffuse esophageal wall thickening, likely esophagitis. 5. Aortic Atherosclerosis (ICD10-I70.0) and Emphysema (ICD10-J43.9). Electronically Signed   By: Carey Bullocks M.D.   On: 10/01/2023 21:27   DG Chest Port 1 View  Result Date: 10/01/2023 CLINICAL DATA:  Shortness of breath EXAM: PORTABLE CHEST 1 VIEW COMPARISON:  Chest x-ray 07/05/2023 FINDINGS: The heart size and mediastinal contours are within normal limits. Both lungs are clear. No acute osseous abnormality. Cervical spinal fusion plate is present. IMPRESSION: No active disease. Electronically Signed   By: Darliss Cheney M.D.   On: 10/01/2023 17:49        Scheduled Meds:  aspirin EC  81 mg Oral Daily   atorvastatin  10 mg Oral Daily   feeding supplement  237 mL Oral TID BM   heparin  5,000 Units Subcutaneous Q8H   ipratropium-albuterol  3 mL Nebulization Q6H   iron polysaccharides  150 mg Oral Daily   levothyroxine  50 mcg Oral q morning   nicotine  14 mg Transdermal  Daily   pantoprazole  40 mg Oral Daily   [START ON 10/03/2023] predniSONE  40 mg Oral Q breakfast   risperiDONE  0.25 mg Oral QHS   sodium chloride flush  3 mL Intravenous Q12H   thiamine  100 mg Oral Daily   Continuous Infusions:  azithromycin (ZITHROMAX) 500 mg in dextrose 5 % 250 mL IVPB     lactated ringers 40 mL/hr at 10/01/23 2253   meropenem (MERREM) IV       LOS: 0 days     Silvano Bilis, MD [facility]   If 7PM-7AM, please contact night-coverage [email or web address] Password [facility] 10/02/2023, 8:32 AM

## 2023-10-02 NOTE — ED Notes (Signed)
Respiratory at bedside to collect ABG

## 2023-10-02 NOTE — Progress Notes (Signed)
Refused to go for ordered Arterial ABI ultrasound.  Dr. Ashok Pall made aware.  Radiology/Ultrasound staff notified.     10/02/23 1420  Provider Notification  Provider Name/Title Dr. Ashok Pall Attending  Date Provider Notified 10/02/23  Time Provider Notified 1420  Method of Notification  (Secure Chat)  Notification Reason Other (Comment) (Refusal of ordered Ultrasound)  Provider response Evaluate remotely;No new orders  Date of Provider Response 10/02/23  Time of Provider Response 1422

## 2023-10-02 NOTE — Progress Notes (Signed)
PHARMACIST - PHYSICIAN COMMUNICATION  CONCERNING:  Enoxaparin (Lovenox) for DVT Prophylaxis    RECOMMENDATION: Patient was prescribed enoxaprin 40mg  q24 hours for VTE prophylaxis.   Filed Weights   10/01/23 1359  Weight: 40 kg (88 lb 2.9 oz)    Body mass index is 15.14 kg/m.  Estimated Creatinine Clearance: 36.9 mL/min (by C-G formula based on SCr of 0.82 mg/dL).   Patient is candidate for enoxaparin 30mg  every 24 hours based on CrCl <16ml/min or Weight <45kg  DESCRIPTION: Pharmacy has adjusted enoxaparin dose per [facility] policy.  Patient is now receiving enoxaparin 30 mg every 24 hours    Barrie Folk, PharmD Clinical Pharmacist  10/02/2023 8:50 AM

## 2023-10-02 NOTE — Progress Notes (Signed)
Initial Nutrition Assessment  DOCUMENTATION CODES:   Severe malnutrition in context of chronic illness  INTERVENTION:   Vital Cuisine po TID, each supplement provides 520kcal and 22g of protein.   Magic cup TID with meals, each supplement provides 290 kcal and 9 grams of protein  MVI po daily   Pt at high refeed risk; recommend monitor potassium, magnesium and phosphorus labs daily until stable  Daily weights  NUTRITION DIAGNOSIS:   Severe Malnutrition related to chronic illness as evidenced by severe fat depletion, severe muscle depletion, 20 percent weight loss in 8 months.  GOAL:   Patient will meet greater than or equal to 90% of their needs  MONITOR:   PO intake, Supplement acceptance, Labs, Weight trends, I & O's, Skin  REASON FOR ASSESSMENT:   Consult Assessment of nutrition requirement/status  ASSESSMENT:   77 y/o female with h/o thyroid disease, HOH, opiate abuse, chronic pain, PAD, COPD, HTN, CAD, HLD, anxiety, depression, PVD, dementia, hiatal hernia and GERD who is admitted with COPD exacerbation.  Met with pt in room today. Pt is well known to nutrition department from previous admissions. Pt is very HOH and a poor historian and it is difficult to obtain a history from. Pt with poor appetite and oral intake at baseline. Pt's lunch tray is sitting on her side table with only bites taken from it. Pt attempting to eat ice cream with her fork. Pt does not like Ensure or Boost Breeze. RD will add supplements to meal trays. Pt is at high refeed risk. Per chart, pt is down 23lbs(20%) over the past 8 months; this is significant weight loss.    Medications reviewed and include: aspirin, lovenox, lasix, iron, prednisone, nicotine, protonix, KCl, thiamine, azithromycin, cefepime   Labs reviewed: K 3.4(L), BUN 41(H) Wbc- 10.8(H), Hgb 10.5(L), Hct 34.9(L) Cbgs- 120, 122 x 24 hrs   NUTRITION - FOCUSED PHYSICAL EXAM:  Flowsheet Row Most Recent Value  Orbital Region  Moderate depletion  Upper Arm Region Severe depletion  Thoracic and Lumbar Region Severe depletion  Buccal Region Severe depletion  Temple Region Severe depletion  Clavicle Bone Region Moderate depletion  Clavicle and Acromion Bone Region Moderate depletion  Scapular Bone Region Moderate depletion  Dorsal Hand Severe depletion  Patellar Region Severe depletion  Anterior Thigh Region Severe depletion  Posterior Calf Region Severe depletion  Edema (RD Assessment) None  Hair Reviewed  Eyes Reviewed  Mouth Reviewed  Skin Reviewed  [noted dry flaky skin]  Nails Reviewed   Diet Order:   Diet Order             DIET DYS 2 Room service appropriate? Yes; Fluid consistency: Thin  Diet effective now                  EDUCATION NEEDS:   Education needs have been addressed  Skin:  Skin Assessment: Reviewed RN Assessment (dry scaly skin)  Last BM:  pta  Height:   Ht Readings from Last 1 Encounters:  10/01/23 5\' 4"  (1.626 m)    Weight:   Wt Readings from Last 1 Encounters:  10/02/23 42.9 kg   BMI:  Body mass index is 16.23 kg/m.  Estimated Nutritional Needs:   Kcal:  1300-1500kcal/day  Protein:  65-75g/day  Fluid:  1.2-1.4L/day  Betsey Holiday MS, RD, LDN Please refer to [facility] for RD and/or RD on-call/weekend/after hours pager

## 2023-10-02 NOTE — Progress Notes (Signed)
Arrived to [facility] from Emergency Department.     10/02/23 1020  Vitals  Temp 98.6 F (37 C)  Temp Source Oral  BP 138/81  MAP (mmHg) 99  BP Location Right Arm  BP Method Automatic  Patient Position (if appropriate) Lying  Pulse Rate (!) 114  Pulse Rate Source Dinamap  Resp 20  Level of Consciousness  Level of Consciousness Alert  MEWS COLOR  MEWS Score Color Yellow  Oxygen Therapy  SpO2 93 %  O2 Device Nasal Cannula  O2 Flow Rate (L/min) 3 L/min  MEWS Score  MEWS Temp 0  MEWS Systolic 0  MEWS Pulse 2  MEWS RR 0  MEWS LOC 0  MEWS Score 2

## 2023-10-03 DIAGNOSIS — R0602 Shortness of breath: Secondary | ICD-10-CM | POA: Diagnosis not present

## 2023-10-03 LAB — BASIC METABOLIC PANEL
Anion gap: 7 (ref 5–15)
BUN: 40 mg/dL — ABNORMAL HIGH (ref 8–23)
CO2: 37 mmol/L — ABNORMAL HIGH (ref 22–32)
Calcium: 8.4 mg/dL — ABNORMAL LOW (ref 8.9–10.3)
Chloride: 93 mmol/L — ABNORMAL LOW (ref 98–111)
Creatinine, Ser: 0.83 mg/dL (ref 0.44–1.00)
GFR, Estimated: >60 mL/min (ref >60)
Glucose, Bld: 100 mg/dL — ABNORMAL HIGH (ref 70–99)
Potassium: 3.5 mmol/L (ref 3.5–5.1)
Sodium: 137 mmol/L (ref 135–145)

## 2023-10-03 LAB — GLUCOSE, CAPILLARY
Glucose-Capillary: 110 mg/dL — ABNORMAL HIGH (ref 70–99)
Glucose-Capillary: 70 mg/dL (ref 70–99)
Glucose-Capillary: 91 mg/dL (ref 70–99)
Glucose-Capillary: 138 mg/dL — ABNORMAL HIGH (ref 70–99)
Glucose-Capillary: 119 mg/dL — ABNORMAL HIGH (ref 70–99)
Glucose-Capillary: 104 mg/dL — ABNORMAL HIGH (ref 70–99)

## 2023-10-03 LAB — MAGNESIUM: Magnesium: 2.1 mg/dL (ref 1.7–2.4)

## 2023-10-03 LAB — PHOSPHORUS: Phosphorus: 3.2 mg/dL (ref 2.5–4.6)

## 2023-10-03 LAB — MRSA NEXT GEN BY PCR, NASAL: MRSA by PCR Next Gen: DETECTED — AB

## 2023-10-03 MED ORDER — ENOXAPARIN SODIUM 40 MG/0.4ML IJ SOSY
40.0000 mg | PREFILLED_SYRINGE | INTRAMUSCULAR | Status: DC
  Administered 2023-10-04 – 2023-10-08 (×5): 40 mg via SUBCUTANEOUS
  Filled 2023-10-03 (×5): qty 0.4

## 2023-10-03 MED ORDER — OXYCODONE-ACETAMINOPHEN 5-325 MG PO TABS
1.0000 | ORAL_TABLET | ORAL | Status: DC | PRN
  Administered 2023-10-03 – 2023-10-07 (×6): 1 via ORAL
  Filled 2023-10-03 (×7): qty 1

## 2023-10-03 MED ORDER — ARFORMOTEROL TARTRATE 15 MCG/2ML IN NEBU
15.0000 ug | INHALATION_SOLUTION | Freq: Two times a day (BID) | RESPIRATORY_TRACT | Status: DC
  Administered 2023-10-03 – 2023-10-08 (×8): 15 ug via RESPIRATORY_TRACT
  Filled 2023-10-03 (×12): qty 2

## 2023-10-03 MED ORDER — IPRATROPIUM-ALBUTEROL 0.5-2.5 (3) MG/3ML IN SOLN
3.0000 mL | RESPIRATORY_TRACT | Status: DC
  Administered 2023-10-03 – 2023-10-04 (×2): 3 mL via RESPIRATORY_TRACT
  Filled 2023-10-03 (×3): qty 3

## 2023-10-03 MED ORDER — AZITHROMYCIN 250 MG PO TABS
500.0000 mg | ORAL_TABLET | Freq: Every day | ORAL | Status: AC
  Administered 2023-10-03 – 2023-10-05 (×3): 500 mg via ORAL
  Filled 2023-10-03 (×3): qty 2

## 2023-10-03 MED ORDER — OXYCODONE HCL 5 MG PO TABS
5.0000 mg | ORAL_TABLET | ORAL | Status: DC | PRN
  Administered 2023-10-03 – 2023-10-07 (×4): 5 mg via ORAL
  Filled 2023-10-03 (×5): qty 1

## 2023-10-03 NOTE — Plan of Care (Signed)
  Problem: Activity: Goal: Ability to tolerate increased activity will improve Outcome: Progressing   Problem: Respiratory: Goal: Ability to maintain a clear airway will improve Outcome: Progressing   Problem: Respiratory: Goal: Levels of oxygenation will improve Outcome: Progressing   Problem: Respiratory: Goal: Ability to maintain adequate ventilation will improve Outcome: Progressing

## 2023-10-03 NOTE — [place] FL2 (Signed)
[place] MEDICAID FL2 LEVEL OF CARE FORM     IDENTIFICATION  Patient Name: [name] Birthdate: [birth year] Sex: female Admission Date (Current Location): 10/01/2023  [facility] and [place] Number:  Chiropodist and Address:         Provider Number: [id number]  Attending Physician Name and Address:  Tresa Moore, MD  Relative Name and Phone Number:       Current Level of Care: Hospital Recommended Level of Care: Skilled Nursing Facility Prior Approval Number:    Date Approved/Denied:   PASRR Number: [id number]  Discharge Plan: SNF    Current Diagnoses: Patient Active Problem List   Diagnosis Date Noted   Hyperthyroidism 10/02/2023   SOB (shortness of breath) 10/01/2023   Dementia with behavioral disturbance (HCC) 05/11/2023   COPD (chronic obstructive pulmonary disease) (HCC) 05/09/2023   Sinus tachycardia 02/24/2023   CAD (coronary artery disease) 01/30/2023   Normocytic anemia 01/30/2023   Compression fracture of body of thoracic vertebra (HCC) 01/30/2023   Underweight 11/08/2022   Acute on chronic respiratory failure with hypoxia and hypercapnia (HCC) 06/30/2022   Dyslipidemia 06/30/2022   Subacute osteomyelitis of right foot (HCC)    Hypertension    Protein-calorie malnutrition, severe (HCC) 11/21/2021   COPD with acute exacerbation (HCC) 11/21/2021   COPD exacerbation (HCC) 11/20/2021   Chronic pain - multiple sites arthritis 11/20/2021   Benzodiazepine dependence, continuous (HCC) 11/20/2021   Peripheral vascular disease (HCC) 11/20/2021   Hyponatremia 11/20/2021   Hypocalcemia 11/20/2021   Hypoalbuminemia due to protein-calorie malnutrition (HCC) 11/20/2021   Benzodiazepine withdrawal without complication (HCC)    Malnutrition of moderate degree 10/20/2021   Absent pedal pulses 10/19/2021   Chronic hip pain (Left) 02/17/2021   Hip pain, acute (Left) 02/17/2021   Chronic use of opiate for therapeutic purpose 02/16/2021   Closed  fracture of hip, sequela (Left) 02/07/2021   Fracture of femoral neck, left, closed (HCC) 01/11/2021   Chronic respiratory failure with hypoxia (HCC) 01/11/2021   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 (HCC) 12/22/2020   PAD (peripheral artery disease) (HCC) 12/22/2020   Encounter for long-term opiate analgesic use 11/11/2020   Dry gangrene (HCC) of middle toe, right foot 11/11/2020   Foot pain, right 11/11/2020   Hard of hearing 11/11/2020   Atherosclerosis of native arteries of the extremities with gangrene (HCC) 12/09/2019   Vitamin D deficiency 11/12/2019   Pharmacologic therapy 06/04/2019   Disorder of skeletal system 06/04/2019   Problems influencing health status 06/04/2019   Compression fracture of L3 vertebra (HCC) 08/19/2018   Chronic hip pain (Bilateral) 12/31/2017   Neurogenic pain 08/29/2017   Chronic low back pain (1ry area of Pain) (Right) w/o sciatica 08/29/2017   Chronic sacroiliac joint pain (Right) 06/05/2017   Vitamin D insufficiency 03/12/2017   Right hip pain 02/20/2017   Intertrochanteric fracture of right hip, sequela 02/20/2017   B12 deficiency 02/05/2017   Pressure injury of skin 10/02/2016   Closed displaced intertrochanteric fracture of right femur (HCC) 10/02/2016   Hip fracture (HCC) 10/01/2016   Cervical facet hypertrophy (Bilateral) 05/27/2016   History of shoulder surgery 5 (Right) 05/27/2016   Lumbar foraminal stenosis (L3-4) (Left) 05/27/2016   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) 05/27/2016   Lumbar facet hypertrophy (Bilateral) 05/27/2016   Lumbar facet syndrome (Right) 05/27/2016   Lumbar grade 1 Anterolisthesis of L3 over L4 05/27/2016   Chronic shoulder pain (Bilateral) (status post multiple surgeries) (R>L) 12/08/2015   Substance use disorder  Risk: High 09/14/2015   Chronic pain syndrome 09/14/2015   Cervical spondylosis 09/14/2015   Chronic neck pain (2ry area of Pain) (Bilateral) (R>L) 09/14/2015   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) 09/14/2015   Cervical facet syndrome (Bilateral) (R>L) 09/14/2015   Cervical myofascial pain syndrome 09/14/2015   Lumbar spondylosis 09/14/2015   Chronic shoulder impingement syndrome (Right) 09/14/2015   CRP elevated 09/14/2015   Elevated sedimentation rate 09/14/2015   Chronic obstructive pulmonary disease (COPD) (HCC) 09/14/2015   Chronic shoulder pain (Right) 09/14/2015   Abnormal nerve conduction studies 09/14/2015   Encounter for therapeutic drug level monitoring 09/09/2015   Long term current use of opiate analgesic 09/09/2015   Long term prescription opiate use 09/09/2015   Uncomplicated opioid dependence (HCC) 09/09/2015   Opiate use 09/09/2015   Anxiety and depression 07/11/2012   Tobacco use disorder 05/27/2011   Fatigue 05/27/2011   Hypothyroidism 11/25/2010   HLD (hyperlipidemia) 08/24/2010   Tachycardia 08/24/2010   DYSPNEA 08/24/2010    Orientation RESPIRATION BLADDER Height & Weight     Self, Time, Situation, Place  O2 (3L [place]) Continent Weight: 47.4 kg Height:  5\' 4"  (162.6 cm)  BEHAVIORAL SYMPTOMS/MOOD NEUROLOGICAL BOWEL NUTRITION STATUS      Continent Diet (dys 2)  AMBULATORY STATUS COMMUNICATION OF NEEDS Skin   Extensive Assist Verbally Normal                       Personal Care Assistance Level of Assistance              Functional Limitations Info  Hearing   Hearing Info: Impaired      SPECIAL CARE FACTORS FREQUENCY  PT (By licensed PT), OT (By licensed OT)                    Contractures Contractures Info: Not present    Additional Factors Info  Code Status, Allergies Code Status Info: full Allergies Info: NKDA           Current Medications (10/03/2023):  This is the current hospital active medication list Current Facility-Administered Medications  Medication Dose Route Frequency Provider Last Rate Last Admin   acetaminophen (TYLENOL) tablet 650 mg  650 mg Oral Q6H PRN  Gertha Calkin, MD       Or   acetaminophen (TYLENOL) suppository 650 mg  650 mg Rectal Q6H PRN Gertha Calkin, MD       amLODipine (NORVASC) tablet 5 mg  5 mg Oral Daily Wouk, Wilfred Curtis, MD   5 mg at 10/03/23 6063   arformoterol (BROVANA) nebulizer solution 15 mcg  15 mcg Nebulization BID Lolita Patella B, MD       aspirin EC tablet 81 mg  81 mg Oral Daily Irena Cords V, MD   81 mg at 10/03/23 0834   atorvastatin (LIPITOR) tablet 10 mg  10 mg Oral Daily Gertha Calkin, MD   10 mg at 10/03/23 0160   azithromycin (ZITHROMAX) tablet 500 mg  500 mg Oral Daily Sreenath, Sudheer B, MD       bisacodyl (DULCOLAX) EC tablet 5 mg  5 mg Oral Daily PRN Gertha Calkin, MD       ceFEPIme (MAXIPIME) 2 g in sodium chloride 0.9 % 100 mL IVPB  2 g Intravenous Q12H Kathrynn Running, MD 200 mL/hr at 10/03/23 0832 2 g at 10/03/23 0832   [START ON 10/04/2023] enoxaparin (LOVENOX) injection 40 mg  40 mg Subcutaneous Q24H Mila Merry A, RPH       furosemide (LASIX) tablet 20 mg  20 mg Oral Daily Wouk, Wilfred Curtis, MD   20 mg at 10/03/23 0834   guaiFENesin (MUCINEX) 12 hr tablet 600 mg  600 mg Oral BID PRN Gertha Calkin, MD       hydrALAZINE (APRESOLINE) injection 5 mg  5 mg Intravenous Q6H PRN Gertha Calkin, MD       ipratropium-albuterol (DUONEB) 0.5-2.5 (3) MG/3ML nebulizer solution 3 mL  3 mL Nebulization Q6H PRN Gertha Calkin, MD       ipratropium-albuterol (DUONEB) 0.5-2.5 (3) MG/3ML nebulizer solution 3 mL  3 mL Nebulization Q4H Sreenath, Sudheer B, MD       iron polysaccharides (NIFEREX) capsule 150 mg  150 mg Oral Daily Irena Cords V, MD   150 mg at 10/03/23 1610   metoprolol tartrate (LOPRESSOR) tablet 25 mg  25 mg Oral BID Kathrynn Running, MD   25 mg at 10/03/23 9604   multivitamin with minerals tablet 1 tablet  1 tablet Oral Daily Kathrynn Running, MD   1 tablet at 10/03/23 5409   nicotine (NICODERM CQ - dosed in mg/24 hours) patch 14 mg  14 mg Transdermal Daily Gertha Calkin, MD        ondansetron (ZOFRAN) tablet 4 mg  4 mg Oral Q6H PRN Gertha Calkin, MD       Or   ondansetron (ZOFRAN) injection 4 mg  4 mg Intravenous Q6H PRN Gertha Calkin, MD       oxyCODONE-acetaminophen (PERCOCET/ROXICET) 5-325 MG per tablet 1 tablet  1 tablet Oral Q8H PRN Barrie Folk, [facility]   1 tablet at 10/02/23 1839   And   oxyCODONE (Oxy IR/ROXICODONE) immediate release tablet 5 mg  5 mg Oral Q8H PRN Barrie Folk, RPH   5 mg at 10/03/23 0829   pantoprazole (PROTONIX) EC tablet 40 mg  40 mg Oral Daily Irena Cords V, MD   40 mg at 10/03/23 0833   polyethylene glycol (MIRALAX / GLYCOLAX) packet 17 g  17 g Oral Daily PRN Gertha Calkin, MD       predniSONE (DELTASONE) tablet 40 mg  40 mg Oral Q breakfast Irena Cords V, MD   40 mg at 10/03/23 0833   risperiDONE (RISPERDAL) tablet 0.25 mg  0.25 mg Oral QHS Irena Cords V, MD   0.25 mg at 10/02/23 2106   sodium chloride flush (NS) 0.9 % injection 3 mL  3 mL Intravenous Q12H Irena Cords V, MD   3 mL at 10/03/23 0835   thiamine (VITAMIN B1) tablet 100 mg  100 mg Oral Daily Gertha Calkin, MD   100 mg at 10/03/23 8119   traZODone (DESYREL) tablet 150 mg  150 mg Oral QHS Wouk, Wilfred Curtis, MD         Discharge Medications: Please see discharge summary for a list of discharge medications.  Relevant Imaging Results:  Relevant Lab Results:   Additional Information SS: [id number]  Chapman Fitch, RN

## 2023-10-03 NOTE — TOC Initial Note (Addendum)
Transition of Care [facility]) - Initial/Assessment Note    Patient Details  Name: [name] MRN: [id number] Date of Birth: April 26, 1946  [facility] CM/SW Contact:    Chapman Fitch, RN Phone Number: 10/03/2023, 10:47 AM  Clinical Narrative:                  Admitted for: SOB Admitted from: home alone  PCP:  Arlana Pouch Pharmacy: Current home health/prior home health/DME: [employer] o2  Therapy recommending SNF.  Patient in agreement for bed search  Existing PASRR Fl2 sent for signature Bed search initiated    Update: 130 pm. Bed offers presented.  Patient accepted bed at [facility].  Accepted in HUB and notified Tiffany at [employer].  Auth started through [employer] portal   Expected Discharge Plan: Skilled Nursing Facility     Patient Goals and CMS Choice            Expected Discharge Plan and Services                                              Prior Living Arrangements/Services                       Activities of Daily Living   ADL Screening (condition at time of admission) Independently performs ADLs?: No Does the patient have a NEW difficulty with bathing/dressing/toileting/self-feeding that is expected to last >3 days?: No Does the patient have a NEW difficulty with getting in/out of bed, walking, or climbing stairs that is expected to last >3 days?: No Does the patient have a NEW difficulty with communication that is expected to last >3 days?: No Is the patient deaf or have difficulty hearing?: Yes Does the patient have difficulty seeing, even when wearing glasses/contacts?: No Does the patient have difficulty concentrating, remembering, or making decisions?: Yes  Permission Sought/Granted                  Emotional Assessment              Admission diagnosis:  SOB (shortness of breath) [R06.02] COPD exacerbation (HCC) [J44.1] Pneumonia of right lower lobe due to infectious organism  [J18.9] Patient Active Problem List   Diagnosis Date Noted   Hyperthyroidism 10/02/2023   SOB (shortness of breath) 10/01/2023   Dementia with behavioral disturbance (HCC) 05/11/2023   COPD (chronic obstructive pulmonary disease) (HCC) 05/09/2023   Sinus tachycardia 02/24/2023   CAD (coronary artery disease) 01/30/2023   Normocytic anemia 01/30/2023   Compression fracture of body of thoracic vertebra (HCC) 01/30/2023   Underweight 11/08/2022   Acute on chronic respiratory failure with hypoxia and hypercapnia (HCC) 06/30/2022   Dyslipidemia 06/30/2022   Subacute osteomyelitis of right foot (HCC)    Hypertension    Protein-calorie malnutrition, severe (HCC) 11/21/2021   COPD with acute exacerbation (HCC) 11/21/2021   COPD exacerbation (HCC) 11/20/2021   Chronic pain - multiple sites arthritis 11/20/2021   Benzodiazepine dependence, continuous (HCC) 11/20/2021   Peripheral vascular disease (HCC) 11/20/2021   Hyponatremia 11/20/2021   Hypocalcemia 11/20/2021   Hypoalbuminemia due to protein-calorie malnutrition (HCC) 11/20/2021   Benzodiazepine withdrawal without complication (HCC)    Malnutrition of moderate degree 10/20/2021   Absent pedal pulses 10/19/2021   Chronic hip pain (Left) 02/17/2021   Hip pain, acute (Left) 02/17/2021   Chronic  use of opiate for therapeutic purpose 02/16/2021   Closed fracture of hip, sequela (Left) 02/07/2021   Fracture of femoral neck, left, closed (HCC) 01/11/2021   Chronic respiratory failure with hypoxia (HCC) 01/11/2021   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 (HCC) 12/22/2020   PAD (peripheral artery disease) (HCC) 12/22/2020   Encounter for long-term opiate analgesic use 11/11/2020   Dry gangrene (HCC) of middle toe, right foot 11/11/2020   Foot pain, right 11/11/2020   Hard of hearing 11/11/2020   Atherosclerosis of native arteries of the extremities with gangrene (HCC) 12/09/2019   Vitamin D deficiency 11/12/2019   Pharmacologic therapy  06/04/2019   Disorder of skeletal system 06/04/2019   Problems influencing health status 06/04/2019   Compression fracture of L3 vertebra (HCC) 08/19/2018   Chronic hip pain (Bilateral) 12/31/2017   Neurogenic pain 08/29/2017   Chronic low back pain (1ry area of Pain) (Right) w/o sciatica 08/29/2017   Chronic sacroiliac joint pain (Right) 06/05/2017   Vitamin D insufficiency 03/12/2017   Right hip pain 02/20/2017   Intertrochanteric fracture of right hip, sequela 02/20/2017   B12 deficiency 02/05/2017   Pressure injury of skin 10/02/2016   Closed displaced intertrochanteric fracture of right femur (HCC) 10/02/2016   Hip fracture (HCC) 10/01/2016   Cervical facet hypertrophy (Bilateral) 05/27/2016   History of shoulder surgery 5 (Right) 05/27/2016   Lumbar foraminal stenosis (L3-4) (Left) 05/27/2016   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) 05/27/2016   Lumbar facet hypertrophy (Bilateral) 05/27/2016   Lumbar facet syndrome (Right) 05/27/2016   Lumbar grade 1 Anterolisthesis of L3 over L4 05/27/2016   Chronic shoulder pain (Bilateral) (status post multiple surgeries) (R>L) 12/08/2015   Substance use disorder Risk: High 09/14/2015   Chronic pain syndrome 09/14/2015   Cervical spondylosis 09/14/2015   Chronic neck pain (2ry area of Pain) (Bilateral) (R>L) 09/14/2015   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) 09/14/2015   Cervical facet syndrome (Bilateral) (R>L) 09/14/2015   Cervical myofascial pain syndrome 09/14/2015   Lumbar spondylosis 09/14/2015   Chronic shoulder impingement syndrome (Right) 09/14/2015   CRP elevated 09/14/2015   Elevated sedimentation rate 09/14/2015   Chronic obstructive pulmonary disease (COPD) (HCC) 09/14/2015   Chronic shoulder pain (Right) 09/14/2015   Abnormal nerve conduction studies 09/14/2015   Encounter for therapeutic drug level monitoring 09/09/2015   Long term current use of opiate analgesic 09/09/2015    Long term prescription opiate use 09/09/2015   Uncomplicated opioid dependence (HCC) 09/09/2015   Opiate use 09/09/2015   Anxiety and depression 07/11/2012   Tobacco use disorder 05/27/2011   Fatigue 05/27/2011   Hypothyroidism 11/25/2010   HLD (hyperlipidemia) 08/24/2010   Tachycardia 08/24/2010   DYSPNEA 08/24/2010   PCP:  [name], MD Pharmacy:   [facility] - [place], [place] - [address] [place] [address] Phone: [phone] Fax: [phone]  [id number] - [place], [place] - [address] [place] [address] Phone: [phone] Fax: [phone]  [id number] [place], [place] - [address] AT [facility] [address] [place] [address] [place] [address] Phone: [phone] Fax: [phone]  [facility] [address] [place] [address] Phone: [phone] Fax: [phone]     Social Determinants of Health (SDOH) Social History: SDOH Screenings   Food Insecurity: No Food Insecurity (10/02/2023)  Housing: Low Risk  (  10/02/2023)  Transportation Needs: No Transportation Needs (10/02/2023)  Utilities: Not At Risk (10/02/2023)  Depression (PHQ2-9): Low Risk  (07/13/2021)  Tobacco Use: High Risk (10/01/2023)   SDOH Interventions:     Readmission Risk Interventions    01/31/2023   11:49 AM 07/05/2022   10:50 AM 11/25/2021    9:50 AM  Readmission Risk Prevention Plan  Transportation Screening Complete Complete Complete  Medication Review Oceanographer) Complete Complete Complete  PCP or Specialist appointment within 3-5 days of discharge Complete  Complete  HRI or Home Care Consult  Complete Complete  SW Recovery Care/Counseling Consult Complete Complete   Palliative Care Screening Not Applicable  Not Applicable  Skilled Nursing Facility Not Applicable Not Applicable Not Applicable

## 2023-10-03 NOTE — Progress Notes (Signed)
PROGRESS NOTE    [name] [facility]  GLO:[id number] DOB: Sep 16, 1946 DOA: 10/01/2023 PCP: [name], MD    Brief Narrative:   [name] is a 77 y.o. female with medical history significant for CAD, hypothyroid, advanced COPD seen by pulmonary [name], chronic respiratory failure with 3 L of oxygen nasal cannula at home, hyperlipidemia, hypertension, who presents to the emergency department for sob that started acutely today with hypoxia/ hypercapnia on bipap, with reports of intermittent chest pain currently with elevated troponin of 97 and EKG showing regular fast rhythm which is most likely atrial flutter repeat EKG is pending, CTA chest is ordered as patient is high risk for VTE with her history of reduction internal fixation and displaced intertrochanteric right hip fracture, also history of PAD followed by vascular Dr. [name] and history of amputations of the second, third and fourth MTP, chest xray is negative  with white count of  12.3. Admission requested for suspected COPD exacerbation. In emergency room vitals trend shows patient meeting SIRS criteria infection is likely but not suspected.     Assessment & Plan:   Principal Problem:   SOB (shortness of breath) Active Problems:   Chronic pain syndrome   COPD exacerbation (HCC)   Acute on chronic respiratory failure with hypoxia and hypercapnia (HCC)   CAD (coronary artery disease)   Hypertension   Underweight   Hypothyroidism   Protein-calorie malnutrition, severe (HCC)   Opiate use   PAD (peripheral artery disease) (HCC)   COPD with acute exacerbation (HCC)   Hyperthyroidism # Acute on chronic hypoxic hypercapnic respiratory failure With elevated co2 and encephalopathy overnight, required bipap, now weaned back to baseline 3 liters and no tachypnea Plan: COPD treatment as below   # COPD, severe, with acute exacerbation No PE or focal infiltrate on CXR. Pseudomonas respiratory culture last year.   Plan: Bronchodilators Prednisone Broad-spectrum antibiotic, short course  # PAD # Prior right toe amputations Has ulcer on dorsum of right first toe -Patient refused ABI   # Foley catheter Per nursing placed last night per MD order but reason why is unclear, no report of retention -Patient voiding   # CAD # Tropinemia Denies chest pain, trops mildly elevated and flat, no ischemic changes on EKG. Suspect demand. Had relatively benign TTE in April of this year - monitor - home aspirin, statin, metop   # HFpEF Appears euvolemic - home metop, lasix   # GAD Home xanax   # Dementia With history behavioral disturbance - delirium precautions - home trazodone, risperdal (but will lower trazodone dose from 300 to 150)   # Chronic pain - cont home percocet   # HTN Mild elevation today - resume home amlodipine, metop   # Thyroid Carries diagnosis of both hypothyroidism and hyperthyroidism. Currently on levothyroxine. TSH is low and t4 is elevated - advise holding levothyroxine and repeating TFTs outpatient in 4-6 wks   # Debility PT consult   # Severe malnutrition - RD consult -Palliative care consult      DVT prophylaxis: SQ Lovenox Code Status: Full Family Communication: None Disposition Plan: Status is: Inpatient Remains inpatient appropriate because: Severe COPD flare   Level of care: Med-Surg  Consultants:  None, pulmonary signed off  Procedures:  None  Antimicrobials: Meropenem   Subjective: Seen and examined.  Resting in bed.  Appears fatigued but reports symptomatic improvement in respiratory status.  Objective: Vitals:   10/02/23 2335 10/03/23 0403 10/03/23 0404 10/03/23 0738  BP: 113/66  105/61  114/65  Pulse: 90 84  82  Resp: 17 20  18   Temp: 97.8 F (36.6 C) 97.8 F (36.6 C)  97.7 F (36.5 C)  TempSrc: Oral Oral    SpO2: 100% 99%  97%  Weight:   47.4 kg   Height:        Intake/Output Summary (Last 24 hours) at 10/03/2023  1353 Last data filed at 10/03/2023 1031 Gross per 24 hour  Intake 530 ml  Output --  Net 530 ml   Filed Weights   10/01/23 1359 10/02/23 1532 10/03/23 0404  Weight: 40 kg 42.9 kg 47.4 kg    Examination:  General exam: NAD.  Appears frail and chronically ill Respiratory system: Coarse sounds bilaterally.  Decreased air entry.  Mild end expiratory wheeze.  3 L Cardiovascular system: S1-S2, RRR, no murmurs, no pedal edema Gastrointestinal system: Thin, soft, NT/ND, normal bowel sounds Central nervous system: Alert and oriented. No focal neurological deficits. Extremities: Symmetric 5 x 5 power. Skin: No rashes, lesions or ulcers Psychiatry: Judgement and insight appear normal. Mood & affect appropriate.     Data Reviewed: I have personally reviewed following labs and imaging studies  CBC: Recent Labs  Lab 10/01/23 1401 10/02/23 0438  WBC 12.3* 10.8*  HGB 12.3 10.5*  HCT 41.2 34.9*  MCV 97.4 94.1  PLT 353 287   Basic Metabolic Panel: Recent Labs  Lab 10/01/23 1401 10/02/23 0438 10/03/23 0433  NA 142 143 137  K 3.6 3.4* 3.5  CL 96* 96* 93*  CO2 39* 37* 37*  GLUCOSE 115* 137* 100*  BUN 42* 41* 40*  CREATININE 0.82 0.82 0.83  CALCIUM 8.6* 8.5* 8.4*  MG  --   --  2.1  PHOS  --   --  3.2   GFR: Estimated Creatinine Clearance: 43.1 mL/min (by C-G formula based on SCr of 0.83 mg/dL). Liver Function Tests: Recent Labs  Lab 10/02/23 0438  AST 11*  ALT 14  ALKPHOS 57  BILITOT 0.7  PROT 6.4*  ALBUMIN 3.3*   No results for input(s): "LIPASE", "AMYLASE" in the last 168 hours. No results for input(s): "AMMONIA" in the last 168 hours. Coagulation Profile: No results for input(s): "INR", "PROTIME" in the last 168 hours. Cardiac Enzymes: No results for input(s): "CKTOTAL", "CKMB", "CKMBINDEX", "TROPONINI" in the last 168 hours. BNP (last 3 results) No results for input(s): "PROBNP" in the last 8760 hours. HbA1C: No results for input(s): "HGBA1C" in the last 72  hours. CBG: Recent Labs  Lab 10/02/23 1940 10/02/23 2337 10/03/23 0402 10/03/23 0741 10/03/23 1155  GLUCAP 154* 150* 110* 70 91   Lipid Profile: No results for input(s): "CHOL", "HDL", "LDLCALC", "TRIG", "CHOLHDL", "LDLDIRECT" in the last 72 hours. Thyroid Function Tests: Recent Labs    10/01/23 1401  TSH 0.032*  FREET4 1.43*   Anemia Panel: No results for input(s): "VITAMINB12", "FOLATE", "FERRITIN", "TIBC", "IRON", "RETICCTPCT" in the last 72 hours. Sepsis Labs: No results for input(s): "PROCALCITON", "LATICACIDVEN" in the last 168 hours.  Recent Results (from the past 240 hour(s))  Resp panel by RT-PCR (RSV, Flu A&B, Covid) Anterior Nasal Swab     Status: None   Collection Time: 10/01/23  2:50 PM   Specimen: Anterior Nasal Swab  Result Value Ref Range Status   SARS Coronavirus 2 by RT PCR NEGATIVE NEGATIVE Final    Comment: (NOTE) SARS-CoV-2 target nucleic acids are NOT DETECTED.  The SARS-CoV-2 RNA is generally detectable in upper respiratory specimens during the acute phase  of infection. The lowest concentration of SARS-CoV-2 viral copies this assay can detect is 138 copies/mL. A negative result does not preclude SARS-Cov-2 infection and should not be used as the sole basis for treatment or other patient management decisions. A negative result may occur with  improper specimen collection/handling, submission of specimen other than nasopharyngeal swab, presence of viral mutation(s) within the areas targeted by this assay, and inadequate number of viral copies(<138 copies/mL). A negative result must be combined with clinical observations, patient history, and epidemiological information. The expected result is Negative.  Fact Sheet for Patients:  BloggerCourse.com  Fact Sheet for Healthcare Providers:  SeriousBroker.it  This test is no t yet approved or cleared by the [place] FDA and  has been authorized  for detection and/or diagnosis of SARS-CoV-2 by FDA under an Emergency Use Authorization (EUA). This EUA will remain  in effect (meaning this test can be used) for the duration of the COVID-19 declaration under Section 564(b)(1) of the Act, 21 U.S.C.section 360bbb-3(b)(1), unless the authorization is terminated  or revoked sooner.       Influenza A by PCR NEGATIVE NEGATIVE Final   Influenza B by PCR NEGATIVE NEGATIVE Final    Comment: (NOTE) The Xpert Xpress SARS-CoV-2/FLU/RSV plus assay is intended as an aid in the diagnosis of influenza from Nasopharyngeal swab specimens and should not be used as a sole basis for treatment. Nasal washings and aspirates are unacceptable for Xpert Xpress SARS-CoV-2/FLU/RSV testing.  Fact Sheet for Patients: BloggerCourse.com  Fact Sheet for Healthcare Providers: SeriousBroker.it  This test is not yet approved or cleared by the [place] FDA and has been authorized for detection and/or diagnosis of SARS-CoV-2 by FDA under an Emergency Use Authorization (EUA). This EUA will remain in effect (meaning this test can be used) for the duration of the COVID-19 declaration under Section 564(b)(1) of the Act, 21 U.S.C. section 360bbb-3(b)(1), unless the authorization is terminated or revoked.     Resp Syncytial Virus by PCR NEGATIVE NEGATIVE Final    Comment: (NOTE) Fact Sheet for Patients: BloggerCourse.com  Fact Sheet for Healthcare Providers: SeriousBroker.it  This test is not yet approved or cleared by the [place] FDA and has been authorized for detection and/or diagnosis of SARS-CoV-2 by FDA under an Emergency Use Authorization (EUA). This EUA will remain in effect (meaning this test can be used) for the duration of the COVID-19 declaration under Section 564(b)(1) of the Act, 21 U.S.C. section 360bbb-3(b)(1), unless the authorization is  terminated or revoked.  Performed at [facility], [address] [place], [place] [address]   Respiratory (~20 pathogens) panel by PCR     Status: None   Collection Time: 10/02/23  8:38 AM   Specimen: Nasopharyngeal Swab; Respiratory  Result Value Ref Range Status   Adenovirus NOT DETECTED NOT DETECTED Final   Coronavirus 229E NOT DETECTED NOT DETECTED Final    Comment: (NOTE) The Coronavirus on the Respiratory Panel, DOES NOT test for the novel  Coronavirus (2019 nCoV)    Coronavirus HKU1 NOT DETECTED NOT DETECTED Final   Coronavirus NL63 NOT DETECTED NOT DETECTED Final   Coronavirus OC43 NOT DETECTED NOT DETECTED Final   Metapneumovirus NOT DETECTED NOT DETECTED Final   Rhinovirus / Enterovirus NOT DETECTED NOT DETECTED Final   Influenza A NOT DETECTED NOT DETECTED Final   Influenza B NOT DETECTED NOT DETECTED Final   Parainfluenza Virus 1 NOT DETECTED NOT DETECTED Final   Parainfluenza Virus 2 NOT DETECTED NOT DETECTED Final  Parainfluenza Virus 3 NOT DETECTED NOT DETECTED Final   Parainfluenza Virus 4 NOT DETECTED NOT DETECTED Final   Respiratory Syncytial Virus NOT DETECTED NOT DETECTED Final   Bordetella pertussis NOT DETECTED NOT DETECTED Final   Bordetella Parapertussis NOT DETECTED NOT DETECTED Final   Chlamydophila pneumoniae NOT DETECTED NOT DETECTED Final   Mycoplasma pneumoniae NOT DETECTED NOT DETECTED Final    Comment: Performed at [facility], [address], [place], [place] [address]  MRSA Next Gen by PCR, Nasal     Status: Abnormal   Collection Time: 10/03/23  7:19 AM   Specimen: Nasal Mucosa; Nasal Swab  Result Value Ref Range Status   MRSA by PCR Next Gen DETECTED (A) NOT DETECTED Final    Comment: RESULT CALLED TO, READ BACK BY AND VERIFIED WITH: Blanchard Mane RN [phone] 10/03/23 HNM (NOTE) The GeneXpert MRSA Assay (FDA approved for NASAL specimens only), is one component of a comprehensive MRSA colonization surveillance program. It is  not intended to diagnose MRSA infection nor to guide or monitor treatment for MRSA infections. Test performance is not FDA approved in patients less than 60 years old. Performed at [facility], [address] [place], [place] [address]          Radiology Studies: DG Toe Great Right  Result Date: 10/02/2023 CLINICAL DATA:  Pain.  Great toe EXAM: RIGHT GREAT TOE three views COMPARISON:  Right foot x-ray 06/29/2022. FINDINGS: Osteopenia. Of the great toe there is no fracture or dislocation. There is no true lateral film obtained. Soft tissue swelling identified. There has been amputation of the phalanges of the second, third and fourth digits adjacent. Minimal soft tissue calcification of the amputation site. IMPRESSION: Osteopenia.  Soft tissue swelling. Electronically Signed   By: Karen Kays M.D.   On: 10/02/2023 12:50   CT Head Wo Contrast  Result Date: 10/02/2023 CLINICAL DATA:  Altered mental status. EXAM: CT HEAD WITHOUT CONTRAST TECHNIQUE: Contiguous axial images were obtained from the base of the skull through the vertex without intravenous contrast. RADIATION DOSE REDUCTION: This exam was performed according to the departmental dose-optimization program which includes automated exposure control, adjustment of the mA and/or kV according to patient size and/or use of iterative reconstruction technique. COMPARISON:  February 24, 2023 FINDINGS: Brain: There is mild cerebral atrophy with widening of the extra-axial spaces and stable ventricular dilatation. There are areas of decreased attenuation within the white matter tracts of the supratentorial brain, consistent with microvascular disease changes. Vascular: Marked severity bilateral cavernous carotid artery calcification is noted. Skull: Normal. Negative for fracture or focal lesion. Sinuses/Orbits: No acute finding. Other: None. IMPRESSION: 1. Generalized cerebral atrophy with chronic white matter small vessel ischemic changes.  2. No acute intracranial abnormality. Electronically Signed   By: Aram Candela M.D.   On: 10/02/2023 03:45   CT Angio Chest PE W and/or Wo Contrast  Result Date: 10/01/2023 CLINICAL DATA:  Shortness of breath with hypoxia. COPD exacerbation. Clinical concern for pulmonary embolism. EXAM: CT ANGIOGRAPHY CHEST WITH CONTRAST TECHNIQUE: Multidetector CT imaging of the chest was performed using the standard protocol during bolus administration of intravenous contrast. Multiplanar CT image reconstructions and MIPs were obtained to evaluate the vascular anatomy. RADIATION DOSE REDUCTION: This exam was performed according to the departmental dose-optimization program which includes automated exposure control, adjustment of the mA and/or kV according to patient size and/or use of iterative reconstruction technique. CONTRAST:  75mL OMNIPAQUE IOHEXOL 350 MG/ML SOLN COMPARISON:  Radiographs 10/01/2023 and  07/05/2023. Chest CTA 01/30/2023 and 01/13/2021. FINDINGS: Cardiovascular: The pulmonary arteries are well opacified with contrast to the level of the segmental branches. There is no evidence of acute pulmonary embolism. Atherosclerosis of the aorta, great vessels and coronary arteries without evidence of acute systemic arterial abnormality. The heart size is normal. There is no pericardial effusion. Mediastinum/Nodes: Unchanged mild mediastinal and right hilar adenopathy. Unchanged diffuse esophageal wall thickening. The thyroid gland and trachea appear unremarkable. Lungs/Pleura: No pleural effusion or pneumothorax. Moderate to severe centrilobular emphysema with diffuse central airway thickening. Interval improved aeration of the right lower lobe. No residual focal airspace disease to suggest pneumonia. Upper abdomen: The visualized upper abdomen appears unchanged. There is chronic extrahepatic biliary dilatation status post cholecystectomy. Stable chronic nodular thickening of the adrenal glands and bilateral  renal cysts, for which no specific follow-up imaging is recommended. Musculoskeletal/Chest wall: There is no chest wall mass or suspicious osseous finding. Multiple thoracic compression deformities are unchanged from the most recent prior study. Previous cervicothoracic fusion. Unless specific follow-up recommendations are mentioned in the findings or impression sections, no imaging follow-up of any mentioned incidental findings is recommended. Review of the MIP images confirms the above findings. IMPRESSION: 1. No evidence of acute pulmonary embolism or other acute thoracic findings. 2. Interval improved aeration of the right lower lobe. No residual focal airspace disease to suggest pneumonia. 3. Unchanged mild mediastinal and right hilar adenopathy, likely reactive. 4. Unchanged diffuse esophageal wall thickening, likely esophagitis. 5. Aortic Atherosclerosis (ICD10-I70.0) and Emphysema (ICD10-J43.9). Electronically Signed   By: Carey Bullocks M.D.   On: 10/01/2023 21:27   DG Chest Port 1 View  Result Date: 10/01/2023 CLINICAL DATA:  Shortness of breath EXAM: PORTABLE CHEST 1 VIEW COMPARISON:  Chest x-ray 07/05/2023 FINDINGS: The heart size and mediastinal contours are within normal limits. Both lungs are clear. No acute osseous abnormality. Cervical spinal fusion plate is present. IMPRESSION: No active disease. Electronically Signed   By: Darliss Cheney M.D.   On: 10/01/2023 17:49        Scheduled Meds:  amLODipine  5 mg Oral Daily   arformoterol  15 mcg Nebulization BID   aspirin EC  81 mg Oral Daily   atorvastatin  10 mg Oral Daily   azithromycin  500 mg Oral Daily   [START ON 10/04/2023] enoxaparin (LOVENOX) injection  40 mg Subcutaneous Q24H   furosemide  20 mg Oral Daily   ipratropium-albuterol  3 mL Nebulization Q4H   iron polysaccharides  150 mg Oral Daily   metoprolol tartrate  25 mg Oral BID   multivitamin with minerals  1 tablet Oral Daily   nicotine  14 mg Transdermal Daily    pantoprazole  40 mg Oral Daily   predniSONE  40 mg Oral Q breakfast   risperiDONE  0.25 mg Oral QHS   sodium chloride flush  3 mL Intravenous Q12H   thiamine  100 mg Oral Daily   traZODone  150 mg Oral QHS   Continuous Infusions:  ceFEPime (MAXIPIME) IV 2 g (10/03/23 0832)     LOS: 1 day     Tresa Moore, MD [facility]   If 7PM-7AM, please contact night-coverage  10/03/2023, 1:53 PM

## 2023-10-03 NOTE — Progress Notes (Addendum)
PT Cancellation Note  Patient Details Name: [name] MRN: [id number] DOB: [birth year]   Cancelled Treatment:    PT attempt. " I don't want to right now. I haven't slept in three days." Author encouraged participation but pt remained unwilling. Will return later and continue to follow per current POC. Pt is well known by Thereasa Parkin from previous admissions, and not unusual for pt to request PT to return at later times.      Attempted 2nd/3rd time. Pt extremely HOH. Wrote on the board for communication however pt remained unwilling. PT will continue efforts per current POC.   Rushie Chestnut 10/03/2023, 11:51 AM

## 2023-10-04 DIAGNOSIS — Z7189 Other specified counseling: Secondary | ICD-10-CM

## 2023-10-04 DIAGNOSIS — R0602 Shortness of breath: Secondary | ICD-10-CM | POA: Diagnosis not present

## 2023-10-04 LAB — GLUCOSE, CAPILLARY
Glucose-Capillary: 68 mg/dL — ABNORMAL LOW (ref 70–99)
Glucose-Capillary: 62 mg/dL — ABNORMAL LOW (ref 70–99)
Glucose-Capillary: 91 mg/dL (ref 70–99)
Glucose-Capillary: 130 mg/dL — ABNORMAL HIGH (ref 70–99)

## 2023-10-04 LAB — BASIC METABOLIC PANEL
Anion gap: 10 (ref 5–15)
BUN: 26 mg/dL — ABNORMAL HIGH (ref 8–23)
CO2: 37 mmol/L — ABNORMAL HIGH (ref 22–32)
Calcium: 8.8 mg/dL — ABNORMAL LOW (ref 8.9–10.3)
Chloride: 91 mmol/L — ABNORMAL LOW (ref 98–111)
Creatinine, Ser: 0.66 mg/dL (ref 0.44–1.00)
GFR, Estimated: >60 mL/min (ref >60)
Glucose, Bld: 100 mg/dL — ABNORMAL HIGH (ref 70–99)
Potassium: 2.9 mmol/L — ABNORMAL LOW (ref 3.5–5.1)
Sodium: 138 mmol/L (ref 135–145)

## 2023-10-04 LAB — BLOOD GAS, ARTERIAL
Acid-Base Excess: 19.9 mmol/L — ABNORMAL HIGH (ref 0.0–2.0)
Bicarbonate: 49.6 mmol/L — ABNORMAL HIGH (ref 20.0–28.0)
O2 Saturation: 96.3 %
Patient temperature: 37
pCO2 arterial: 82 mmHg (ref 32–48)
pH, Arterial: 7.39 (ref 7.35–7.45)
pO2, Arterial: 72 mmHg — ABNORMAL LOW (ref 83–108)

## 2023-10-04 MED ORDER — POTASSIUM CHLORIDE CRYS ER 20 MEQ PO TBCR
40.0000 meq | EXTENDED_RELEASE_TABLET | ORAL | Status: AC
  Administered 2023-10-04 (×2): 40 meq via ORAL
  Filled 2023-10-04 (×2): qty 2

## 2023-10-04 MED ORDER — IPRATROPIUM-ALBUTEROL 0.5-2.5 (3) MG/3ML IN SOLN
3.0000 mL | Freq: Three times a day (TID) | RESPIRATORY_TRACT | Status: DC
  Filled 2023-10-04: qty 3

## 2023-10-04 NOTE — Progress Notes (Signed)
PROGRESS NOTE    [name] [facility]  BJY:[id number] DOB: February 25, 1946 DOA: 10/01/2023 PCP: Alison Shaggy, MD    Brief Narrative:   [name] is a 77 y.o. female with medical history significant for CAD, hypothyroid, advanced COPD seen by pulmonary [name], chronic respiratory failure with 3 L of oxygen nasal cannula at home, hyperlipidemia, hypertension, who presents to the emergency department for sob that started acutely today with hypoxia/ hypercapnia on bipap, with reports of intermittent chest pain currently with elevated troponin of 97 and EKG showing regular fast rhythm which is most likely atrial flutter repeat EKG is pending, CTA chest is ordered as patient is high risk for VTE with her history of reduction internal fixation and displaced intertrochanteric right hip fracture, also history of PAD followed by vascular Dr. [name] and history of amputations of the second, third and fourth MTP, chest xray is negative  with white count of  12.3. Admission requested for suspected COPD exacerbation. In emergency room vitals trend shows patient meeting SIRS criteria infection is likely but not suspected.   Assessment & Plan:   Principal Problem:   SOB (shortness of breath) Active Problems:   Chronic pain syndrome   COPD exacerbation (HCC)   Acute on chronic respiratory failure with hypoxia and hypercapnia (HCC)   CAD (coronary artery disease)   Hypertension   Underweight   Hypothyroidism   Protein-calorie malnutrition, severe (HCC)   Opiate use   PAD (peripheral artery disease) (HCC)   COPD with acute exacerbation (HCC)   Hyperthyroidism  # Acute on chronic hypoxic hypercapnic respiratory failure With elevated co2 and encephalopathy overnight, required bipap, now weaned back to baseline 3 liters and no tachypnea Plan: Continue COPD treatment as below   # COPD, severe, with acute exacerbation No PE or focal infiltrate on CXR. Pseudomonas respiratory culture last year.   Plan: Bronchodilators Prednisone 40 mg daily x 5 days Continue course of antibiotics  # PAD # Prior right toe amputations Has ulcer on dorsum of right first toe -Patient refused ABI   # Foley catheter Per nursing placed last night per MD order but reason why is unclear, no report of retention -Patient voiding   # CAD # Tropinemia Denies chest pain, trops mildly elevated and flat, no ischemic changes on EKG. Suspect demand. Had relatively benign TTE in April of this year - monitor - home aspirin, statin, metop   # HFpEF Appears euvolemic - home metop, lasix   # GAD Home xanax   # Dementia With history behavioral disturbance - delirium precautions - home trazodone, risperdal (but will lower trazodone dose from 300 to 150)   # Chronic pain - cont home percocet   # HTN Mild elevation today - resume home amlodipine, metop   # Thyroid Carries diagnosis of both hypothyroidism and hyperthyroidism. Currently on levothyroxine. TSH is low and t4 is elevated - advise holding levothyroxine and repeating TFTs outpatient in 4-6 wks   # Debility PT consult   # Severe malnutrition - RD consult -Palliative care consult      DVT prophylaxis: SQ Lovenox Code Status: Full Family Communication: None Disposition Plan: Status is: Inpatient Remains inpatient appropriate because: Severe COPD flare   Level of care: Med-Surg  Consultants:  None, pulmonary signed off  Procedures:  None  Antimicrobials: Meropenem   Subjective: Seen and examined.  Resting in bed.  Appears fatigued  Objective: Vitals:   10/03/23 2001 10/04/23 0402 10/04/23 0428 10/04/23 0815  BP: (!) 146/66 Marland Kitchen)  140/70  (!) 155/62  Pulse: 86 66  75  Resp: 20 20  16   Temp: 98.3 F (36.8 C) 97.9 F (36.6 C)  98 F (36.7 C)  TempSrc:  Oral    SpO2: 94% 100%  100%  Weight:   47.1 kg   Height:       No intake or output data in the 24 hours ending 10/04/23 1201  Filed Weights   10/02/23 1532  10/03/23 0404 10/04/23 0428  Weight: 42.9 kg 47.4 kg 47.1 kg    Examination:  General exam: No acute distress.  Frail and chronically ill. Respiratory system: Coarse sounds bilaterally.  Decreased air entry.  End expiratory wheeze.  3 L Cardiovascular system: S1-S2, RRR, no murmurs, no pedal edema Gastrointestinal system: Thin, soft, NT/ND, normal bowel sounds Central nervous system: Alert and oriented. No focal neurological deficits. Extremities: Symmetric 5 x 5 power. Skin: No rashes, lesions or ulcers Psychiatry: Judgement and insight appear normal. Mood & affect appropriate.     Data Reviewed: I have personally reviewed following labs and imaging studies  CBC: Recent Labs  Lab 10/01/23 1401 10/02/23 0438  WBC 12.3* 10.8*  HGB 12.3 10.5*  HCT 41.2 34.9*  MCV 97.4 94.1  PLT 353 287   Basic Metabolic Panel: Recent Labs  Lab 10/01/23 1401 10/02/23 0438 10/03/23 0433 10/04/23 0536  NA 142 143 137 138  K 3.6 3.4* 3.5 2.9*  CL 96* 96* 93* 91*  CO2 39* 37* 37* 37*  GLUCOSE 115* 137* 100* 100*  BUN 42* 41* 40* 26*  CREATININE 0.82 0.82 0.83 0.66  CALCIUM 8.6* 8.5* 8.4* 8.8*  MG  --   --  2.1  --   PHOS  --   --  3.2  --    GFR: Estimated Creatinine Clearance: 44.5 mL/min (by C-G formula based on SCr of 0.66 mg/dL). Liver Function Tests: Recent Labs  Lab 10/02/23 0438  AST 11*  ALT 14  ALKPHOS 57  BILITOT 0.7  PROT 6.4*  ALBUMIN 3.3*   No results for input(s): "LIPASE", "AMYLASE" in the last 168 hours. No results for input(s): "AMMONIA" in the last 168 hours. Coagulation Profile: No results for input(s): "INR", "PROTIME" in the last 168 hours. Cardiac Enzymes: No results for input(s): "CKTOTAL", "CKMB", "CKMBINDEX", "TROPONINI" in the last 168 hours. BNP (last 3 results) No results for input(s): "PROBNP" in the last 8760 hours. HbA1C: No results for input(s): "HGBA1C" in the last 72 hours. CBG: Recent Labs  Lab 10/03/23 2003 10/03/23 2321  10/04/23 0405 10/04/23 0431 10/04/23 0438  GLUCAP 119* 104* 68* 62* 91   Lipid Profile: No results for input(s): "CHOL", "HDL", "LDLCALC", "TRIG", "CHOLHDL", "LDLDIRECT" in the last 72 hours. Thyroid Function Tests: Recent Labs    10/01/23 1401  TSH 0.032*  FREET4 1.43*   Anemia Panel: No results for input(s): "VITAMINB12", "FOLATE", "FERRITIN", "TIBC", "IRON", "RETICCTPCT" in the last 72 hours. Sepsis Labs: No results for input(s): "PROCALCITON", "LATICACIDVEN" in the last 168 hours.  Recent Results (from the past 240 hour(s))  Resp panel by RT-PCR (RSV, Flu A&B, Covid) Anterior Nasal Swab     Status: None   Collection Time: 10/01/23  2:50 PM   Specimen: Anterior Nasal Swab  Result Value Ref Range Status   SARS Coronavirus 2 by RT PCR NEGATIVE NEGATIVE Final    Comment: (NOTE) SARS-CoV-2 target nucleic acids are NOT DETECTED.  The SARS-CoV-2 RNA is generally detectable in upper respiratory specimens during the acute phase of infection.  The lowest concentration of SARS-CoV-2 viral copies this assay can detect is 138 copies/mL. A negative result does not preclude SARS-Cov-2 infection and should not be used as the sole basis for treatment or other patient management decisions. A negative result may occur with  improper specimen collection/handling, submission of specimen other than nasopharyngeal swab, presence of viral mutation(s) within the areas targeted by this assay, and inadequate number of viral copies(<138 copies/mL). A negative result must be combined with clinical observations, patient history, and epidemiological information. The expected result is Negative.  Fact Sheet for Patients:  BloggerCourse.com  Fact Sheet for Healthcare Providers:  SeriousBroker.it  This test is no t yet approved or cleared by the [place] FDA and  has been authorized for detection and/or diagnosis of SARS-CoV-2 by FDA under an  Emergency Use Authorization (EUA). This EUA will remain  in effect (meaning this test can be used) for the duration of the COVID-19 declaration under Section 564(b)(1) of the Act, 21 U.S.C.section 360bbb-3(b)(1), unless the authorization is terminated  or revoked sooner.       Influenza A by PCR NEGATIVE NEGATIVE Final   Influenza B by PCR NEGATIVE NEGATIVE Final    Comment: (NOTE) The Xpert Xpress SARS-CoV-2/FLU/RSV plus assay is intended as an aid in the diagnosis of influenza from Nasopharyngeal swab specimens and should not be used as a sole basis for treatment. Nasal washings and aspirates are unacceptable for Xpert Xpress SARS-CoV-2/FLU/RSV testing.  Fact Sheet for Patients: BloggerCourse.com  Fact Sheet for Healthcare Providers: SeriousBroker.it  This test is not yet approved or cleared by the [place] FDA and has been authorized for detection and/or diagnosis of SARS-CoV-2 by FDA under an Emergency Use Authorization (EUA). This EUA will remain in effect (meaning this test can be used) for the duration of the COVID-19 declaration under Section 564(b)(1) of the Act, 21 U.S.C. section 360bbb-3(b)(1), unless the authorization is terminated or revoked.     Resp Syncytial Virus by PCR NEGATIVE NEGATIVE Final    Comment: (NOTE) Fact Sheet for Patients: BloggerCourse.com  Fact Sheet for Healthcare Providers: SeriousBroker.it  This test is not yet approved or cleared by the [place] FDA and has been authorized for detection and/or diagnosis of SARS-CoV-2 by FDA under an Emergency Use Authorization (EUA). This EUA will remain in effect (meaning this test can be used) for the duration of the COVID-19 declaration under Section 564(b)(1) of the Act, 21 U.S.C. section 360bbb-3(b)(1), unless the authorization is terminated or revoked.  Performed at [facility], [address] [place], [place] [address]   Respiratory (~20 pathogens) panel by PCR     Status: None   Collection Time: 10/02/23  8:38 AM   Specimen: Nasopharyngeal Swab; Respiratory  Result Value Ref Range Status   Adenovirus NOT DETECTED NOT DETECTED Final   Coronavirus 229E NOT DETECTED NOT DETECTED Final    Comment: (NOTE) The Coronavirus on the Respiratory Panel, DOES NOT test for the novel  Coronavirus (2019 nCoV)    Coronavirus HKU1 NOT DETECTED NOT DETECTED Final   Coronavirus NL63 NOT DETECTED NOT DETECTED Final   Coronavirus OC43 NOT DETECTED NOT DETECTED Final   Metapneumovirus NOT DETECTED NOT DETECTED Final   Rhinovirus / Enterovirus NOT DETECTED NOT DETECTED Final   Influenza A NOT DETECTED NOT DETECTED Final   Influenza B NOT DETECTED NOT DETECTED Final   Parainfluenza Virus 1 NOT DETECTED NOT DETECTED Final   Parainfluenza Virus 2 NOT DETECTED NOT DETECTED Final   Parainfluenza  Virus 3 NOT DETECTED NOT DETECTED Final   Parainfluenza Virus 4 NOT DETECTED NOT DETECTED Final   Respiratory Syncytial Virus NOT DETECTED NOT DETECTED Final   Bordetella pertussis NOT DETECTED NOT DETECTED Final   Bordetella Parapertussis NOT DETECTED NOT DETECTED Final   Chlamydophila pneumoniae NOT DETECTED NOT DETECTED Final   Mycoplasma pneumoniae NOT DETECTED NOT DETECTED Final    Comment: Performed at [facility], [address] [place], [place] [address]  MRSA Next Gen by PCR, Nasal     Status: Abnormal   Collection Time: 10/03/23  7:19 AM   Specimen: Nasal Mucosa; Nasal Swab  Result Value Ref Range Status   MRSA by PCR Next Gen DETECTED (A) NOT DETECTED Final    Comment: RESULT CALLED TO, READ BACK BY AND VERIFIED WITH: Blanchard Mane RN [phone] 10/03/23 HNM (NOTE) The GeneXpert MRSA Assay (FDA approved for NASAL specimens only), is one component of a comprehensive MRSA colonization surveillance program. It is not intended to diagnose MRSA infection nor to guide or  monitor treatment for MRSA infections. Test performance is not FDA approved in patients less than 50 years old. Performed at [facility], [address], [place], [place] [address]          Radiology Studies: No results found.      Scheduled Meds:  amLODipine  5 mg Oral Daily   arformoterol  15 mcg Nebulization BID   aspirin EC  81 mg Oral Daily   atorvastatin  10 mg Oral Daily   azithromycin  500 mg Oral Daily   enoxaparin (LOVENOX) injection  40 mg Subcutaneous Q24H   furosemide  20 mg Oral Daily   ipratropium-albuterol  3 mL Nebulization TID   iron polysaccharides  150 mg Oral Daily   metoprolol tartrate  25 mg Oral BID   multivitamin with minerals  1 tablet Oral Daily   nicotine  14 mg Transdermal Daily   pantoprazole  40 mg Oral Daily   predniSONE  40 mg Oral Q breakfast   risperiDONE  0.25 mg Oral QHS   sodium chloride flush  3 mL Intravenous Q12H   thiamine  100 mg Oral Daily   traZODone  150 mg Oral QHS   Continuous Infusions:  ceFEPime (MAXIPIME) IV 2 g (10/04/23 1045)     LOS: 2 days     Tresa Moore, MD [facility]   If 7PM-7AM, please contact night-coverage  10/04/2023, 12:01 PM

## 2023-10-04 NOTE — Progress Notes (Signed)
Hypoglycemic Event  CBG: 68   Treatment: 4 oz juice/soda  Symptoms: Pale and been pale but also patient has very cold fingers.  Follow-up CBG: Time:438 CBG Result:91  Possible Reasons for Event: Inadequate meal intake  Comments/MD notified:N/A    [name]

## 2023-10-04 NOTE — Consult Note (Signed)
Consultation Note Date: 10/04/2023   Patient Name: [name]  DOB: 1946/04/20  MRN: [id number]  Age / Sex: 77 y.o., female  PCP: [name], MD Referring Physician: Tresa Moore, MD  Reason for Consultation: Establishing goals of care  HPI/Patient Profile: PER EMR, [name] is a 77 y.o. female with medical history significant for CAD, hypothyroid, advanced COPD seen by pulmonary [name], chronic respiratory failure with 3 L of oxygen nasal cannula at home, hyperlipidemia, hypertension, who presents to the emergency department for sob that started acutely today with hypoxia/ hypercapnia on bipap, with reports of intermittent chest pain currently with elevated troponin of 97 and EKG showing regular fast rhythm which is most likely atrial flutter repeat EKG is pending, CTA chest is ordered as patient is high risk for VTE with her history of reduction internal fixation and displaced intertrochanteric right hip fracture, also history of PAD followed by vascular Dr. [name] and history of amputations of the second, third and fourth MTP, chest xray is negative  with white count of  12.3. Admission requested for suspected COPD exacerbation. In emergency room vitals trend shows patient meeting SIRS criteria infection is likely but not suspected.  Clinical Assessment and Goals of Care: Notes and labs reviewed. In to see patient. She is extremely HOH and states she no longer has hearing aids. She states she is divorced and lives at home alone with her dog. She states help at home is not always present. She is confused and unable to answer further questions.   Patient with an AD scanned in to ACP tab. It lists Shawn Parls, her DIL is listed as her HPOA. Called to speak with her. She states she is now an ex-DIL. She states patient's 2 children do not have a relationship with her. She advises her son  (patient's grandson) stays with her. She states she is HPOA, and grandson is durable POA.   Ines Bloomer advises that she is unable to talk at this time, but will be able to tomorrow afternoon.        SUMMARY OF RECOMMENDATIONS   PMT to follow up tomorrow.    Prognosis:  Poor     Primary Diagnoses: Present on Admission:  COPD with acute exacerbation (HCC)  PAD (peripheral artery disease) (HCC)  Hypertension  Acute on chronic respiratory failure with hypoxia and hypercapnia (HCC)  CAD (coronary artery disease)  Underweight  Hypothyroidism  SOB (shortness of breath)  Chronic pain syndrome  Opiate use  Protein-calorie malnutrition, severe (HCC)  COPD exacerbation (HCC)   I have reviewed the medical record, interviewed the patient and family, and examined the patient. The following aspects are pertinent.  Past Medical History:  Diagnosis Date   Anxiety    CAD (coronary artery disease)    Carpal tunnel syndrome    Cataract    Complication of anesthesia    has woken up at times   COPD (chronic obstructive pulmonary disease) (HCC)    CRP elevated 09/14/2015   Depression  Difficult intubation    Dyslipidemia    Dyspnea    DOE   Dysrhythmia    hx palpatations   Elevated sedimentation rate 09/14/2015   Esophageal spasm    Gastrointestinal parasites    GERD (gastroesophageal reflux disease)    HCAP (healthcare-associated pneumonia) 10/22/2018   Hiatal hernia    History of peptic ulcer disease    Hypertension    Hyperthyroidism    Hypothyroidism    Low magnesium levels 09/14/2015   Nicotine dependence 09/14/2015   Pelvic fracture (HCC) 2008   fall from riding a horse   Peripheral vascular disease (HCC)    Reflux    Rotator cuff injury    Sepsis (HCC) 10/22/2018   Stenosis, spinal, lumbar    Social History   Socioeconomic History   Marital status: Divorced    Spouse name: Not on file   Number of children: Not on file   Years of education: Not on file    Highest education level: Not on file  Occupational History   Occupation: retired Arboriculturist  Tobacco Use   Smoking status: Some Days    Types: Cigarettes   Smokeless tobacco: Never   Tobacco comments:    pt states 2 cigs per month   Substance and Sexual Activity   Alcohol use: No   Drug use: No   Sexual activity: Never  Other Topics Concern   Not on file  Social History Narrative   Not on file   Social Determinants of Health   Financial Resource Strain: Not on file  Food Insecurity: No Food Insecurity (10/02/2023)   Hunger Vital Sign    Worried About Running Out of Food in the Last Year: Never true    Ran Out of Food in the Last Year: Never true  Transportation Needs: No Transportation Needs (10/02/2023)   PRAPARE - Administrator, Civil Service (Medical): No    Lack of Transportation (Non-Medical): No  Physical Activity: Not on file  Stress: Not on file  Social Connections: Not on file   Family History  Problem Relation Age of Onset   Alcoholism Brother    Aneurysm Other    Scheduled Meds:  amLODipine  5 mg Oral Daily   arformoterol  15 mcg Nebulization BID   aspirin EC  81 mg Oral Daily   atorvastatin  10 mg Oral Daily   azithromycin  500 mg Oral Daily   enoxaparin (LOVENOX) injection  40 mg Subcutaneous Q24H   furosemide  20 mg Oral Daily   ipratropium-albuterol  3 mL Nebulization TID   iron polysaccharides  150 mg Oral Daily   metoprolol tartrate  25 mg Oral BID   multivitamin with minerals  1 tablet Oral Daily   nicotine  14 mg Transdermal Daily   pantoprazole  40 mg Oral Daily   potassium chloride  40 mEq Oral Q2H   predniSONE  40 mg Oral Q breakfast   risperiDONE  0.25 mg Oral QHS   sodium chloride flush  3 mL Intravenous Q12H   thiamine  100 mg Oral Daily   traZODone  150 mg Oral QHS   Continuous Infusions:  ceFEPime (MAXIPIME) IV 2 g (10/04/23 1045)   PRN Meds:.acetaminophen **OR** acetaminophen, bisacodyl, guaiFENesin,  hydrALAZINE, ipratropium-albuterol, ondansetron **OR** ondansetron (ZOFRAN) IV, oxyCODONE-acetaminophen **AND** oxyCODONE, polyethylene glycol Medications Prior to Admission:  Prior to Admission medications   Medication Sig Start Date End Date Taking? Authorizing Provider  ALPRAZolam Prudy Feeler) 1 MG tablet Take 1  mg by mouth 3 (three) times daily as needed. 09/20/23  Yes [provider]  COMBIVENT RESPIMAT 20-100 MCG/ACT AERS respimat Inhale 1-2 puffs into the lungs in the morning, at noon, in the evening, and at bedtime. 09/03/23  Yes [provider]  furosemide (LASIX) 20 MG tablet Take 20 mg by mouth daily. 07/20/23  Yes [provider]  albuterol (VENTOLIN HFA) 108 (90 Base) MCG/ACT inhaler Inhale 1-2 puffs into the lungs every 4 (four) hours as needed for shortness of breath or wheezing. 06/28/22   [provider]  amLODipine (NORVASC) 5 MG tablet Take 1 tablet (5 mg total) by mouth daily. 05/10/23   Leeroy Bock, MD  ammonium lactate (LAC-HYDRIN) 12 % lotion APPLY TO AFFECTED AREA AS NEEDED FOR DRY SKIN 08/09/23   Candelaria Stagers, DPM  ASPIRIN LOW DOSE 81 MG tablet TAKE 1 TABLET BY MOUTH EVERY DAY 05/07/23   Georgiana Spinner, NP  atorvastatin (LIPITOR) 10 MG tablet Take 1 tablet (10 mg total) by mouth daily. Patient not taking: Reported on 05/06/2023 11/10/22 11/10/23  Arnetha Courser, MD  benzonatate (TESSALON) 100 MG capsule Take 1 capsule (100 mg total) by mouth 3 (three) times daily as needed for cough. 03/02/23   Gillis Santa, MD  feeding supplement (ENSURE ENLIVE / ENSURE PLUS) LIQD Take 237 mLs by mouth 3 (three) times daily between meals. 02/03/23   Darlin Priestly, MD  fluticasone-salmeterol (ADVAIR) 250-50 MCG/ACT AEPB Inhale 1 puff into the lungs 2 (two) times daily as needed.    [provider]  hydrOXYzine (ATARAX) 25 MG tablet Take 1 tablet (25 mg total) by mouth 3 (three) times daily as needed for anxiety. 03/02/23   Gillis Santa, MD   ipratropium-albuterol (DUONEB) 0.5-2.5 (3) MG/3ML SOLN Take 3 mLs by nebulization every 6 (six) hours as needed.    [provider]  iron polysaccharides (NIFEREX) 150 MG capsule Take 1 capsule (150 mg total) by mouth daily. 03/03/23 06/01/23  Gillis Santa, MD  levothyroxine (SYNTHROID) 50 MCG tablet Take 50 mcg by mouth every morning.    [provider]  melatonin 5 MG TABS Take 1 tablet (5 mg total) by mouth at bedtime. 03/02/23 03/01/24  Gillis Santa, MD  metoprolol tartrate (LOPRESSOR) 25 MG tablet Take 1 tablet (25 mg total) by mouth 2 (two) times daily. Skip the dose if systolic BP less than 130 mmHg and/or heart rate less than 65 beats per minute 03/02/23 08/29/23  Gillis Santa, MD  Multiple Vitamin (MULTIVITAMIN WITH MINERALS) TABS tablet Take 1 tablet by mouth daily. 11/10/22   Arnetha Courser, MD  omeprazole (PRILOSEC) 40 MG capsule Take 40 mg by mouth daily.    [provider]  oxyCODONE-acetaminophen (PERCOCET) 10-325 MG tablet Take 1 tablet by mouth every 4 (four) hours as needed for pain. 07/03/23   Candelaria Stagers, DPM  polyethylene glycol (MIRALAX / GLYCOLAX) 17 g packet Take 17 g by mouth daily as needed. 05/16/23   Lolita Patella B, MD  potassium chloride SA (KLOR-CON M) 20 MEQ tablet Take 20 mEq by mouth daily.    [provider]  risperiDONE (RISPERDAL) 0.25 MG tablet Take 1 tablet (0.25 mg total) by mouth at bedtime. 03/02/23 05/31/23  Gillis Santa, MD  thiamine (VITAMIN B-1) 100 MG tablet Take 1 tablet (100 mg total) by mouth daily. 11/10/22   Arnetha Courser, MD  traZODone (DESYREL) 150 MG tablet Take 300 mg by mouth at bedtime.    [provider]  No Known Allergies Review of Systems  Unable to perform ROS   Physical Exam Pulmonary:     Effort: Pulmonary effort is normal.  Neurological:     Mental Status: She is alert.     Vital Signs: BP (!) 155/62 (BP Location: Right Arm)   Pulse 75   Temp 98 F (36.7 C)   Resp 16   Ht  5\' 4"  (1.626 m)   Wt 47.1 kg   SpO2 100%   BMI 17.82 kg/m  Pain Scale: 0-10   Pain Score: 0-No pain   SpO2: SpO2: 100 % O2 Device:SpO2: 100 % O2 Flow Rate: .O2 Flow Rate (L/min): 5 L/min  IO: Intake/output summary: No intake or output data in the 24 hours ending 10/04/23 1540  LBM: Last BM Date :  (PTA) Baseline Weight: Weight: 40 kg Most recent weight: Weight: 47.1 kg       Morton Stall, NP   Please contact Palliative Medicine Team phone at [phone] for questions and concerns.  For individual provider: See Loretha Stapler

## 2023-10-04 NOTE — Progress Notes (Signed)
Occupational Therapy Treatment Patient Details Name: [name] MRN: [id number] DOB: [birth year] Today's Date: 10/04/2023   History of present illness Pt is a 77 y/o F admitted on 10/01/23 after presenting with c/o SOB with intermittent chest pain. Pt is being treated with acute on chronic hypoxic hypercapnic respiratory failure. PMH: CAD, hypothyroid, advanced COPD, chronic respiratory failure on 3L O2, HLD, HTN, R toe amputations   OT comments  Pt is supine in bed on arrival. No reports of pain. Nurse present initially reporting pt is suddenly confused. OT attempting to do therapy with pt refusing to get OOB stating "you need to call 911 for me!" As well as asking to borrow therapists phone to do so. OT tried to redirect pt, however unsuccessful. 02 sats 93% and HR WFL. As OT was exiting the room, pt requesting to use the bathroom and PT entering; linens and gown soiled, therefore OT assisted with changing these while PT assisted with [facility] transfer seemingly CGA. Pt able to perform hygiene in long sitting in bed with set up.  PT to message MD regarding confusion. Pt returned to bed with all needs in place and will cont to require skilled acute OT services to maximize her safety and IND to return to PLOF.       If plan is discharge home, recommend the following:  A little help with walking and/or transfers;A little help with bathing/dressing/bathroom;Assistance with cooking/housework;Assist for transportation;Help with stairs or ramp for entrance   Equipment Recommendations  None recommended by OT    Recommendations for Other Services      Precautions / Restrictions Precautions Precautions: Fall Restrictions Weight Bearing Restrictions: No       Mobility Bed Mobility               General bed mobility comments: appeared to perform with SUP with PT and OT present d/t pt's confusion    Transfers Overall transfer level: Needs assistance Equipment used: Rolling walker (2  wheels) Transfers: Sit to/from Stand Sit to Stand: Contact guard assist           General transfer comment: seemingly CGA for [facility] transfer with PT present     Balance Overall balance assessment: Needs assistance, History of Falls Sitting-balance support: Feet supported, Bilateral upper extremity supported Sitting balance-Leahy Scale: Fair                                     ADL either performed or assessed with clinical judgement   ADL Overall ADL's : Needs assistance/impaired                                       General ADL Comments: refused mobility with OT, however upon exit of room pt requesting to use the bathroom and PT entering; linens and gown soiled, therefore OT assisted with changing these while PT assisted with [facility] transfer    Extremity/Trunk Assessment         Cervical / Trunk Assessment Cervical / Trunk Assessment: Kyphotic    Vision       Perception     Praxis      Cognition Arousal: Alert  General Comments: pt is confused, does not know her location, asked therapist to call 911 multiple times and believed she was in a camper        Exercises Other Exercises Other Exercises: Attempting to redirect confused pt today, however unccessful as pt wishing for therapists to call 911 so she could be taken to the hospital.    Shoulder Instructions       General Comments      Pertinent Vitals/ Pain       Pain Assessment Pain Assessment: Faces Faces Pain Scale: No hurt Pain Intervention(s): Monitored during session  Home Living                                          Prior Functioning/Environment              Frequency  Min 1X/week        Progress Toward Goals  OT Goals(current goals can now be found in the care plan section)  Progress towards OT goals: Progressing toward goals  Acute Rehab OT Goals Patient Stated Goal: improve  strength OT Goal Formulation: With patient Time For Goal Achievement: 10/16/23 Potential to Achieve Goals: Good  Plan      Co-evaluation                 AM-PAC OT "6 Clicks" Daily Activity     Outcome Measure   Help from another person eating meals?: None Help from another person taking care of personal grooming?: None Help from another person toileting, which includes using toliet, bedpan, or urinal?: A Little Help from another person bathing (including washing, rinsing, drying)?: A Little Help from another person to put on and taking off regular upper body clothing?: A Little Help from another person to put on and taking off regular lower body clothing?: A Lot 6 Click Score: 19    End of Session Equipment Utilized During Treatment: Rolling walker (2 wheels)  OT Visit Diagnosis: Other abnormalities of gait and mobility (R26.89);Unsteadiness on feet (R26.81);Repeated falls (R29.6)   Activity Tolerance Patient tolerated treatment well   Patient Left in bed;with call bell/phone within reach;with bed alarm set   Nurse Communication Mobility status        Time: 1141-1200 OT Time Calculation (min): 19 min  Charges: OT General Charges $OT Visit: 1 Visit OT Treatments $Therapeutic Activity: 8-22 mins  Lylia Karn, OTR/L  10/04/23, 12:46 PM   Journei Thomassen E Alston Berrie 10/04/2023, 12:43 PM

## 2023-10-04 NOTE — Plan of Care (Signed)
?  Problem: Activity: ?Goal: Ability to tolerate increased activity will improve ?Outcome: Progressing ?  ?Problem: Activity: ?Goal: Will verbalize the importance of balancing activity with adequate rest periods ?Outcome: Progressing ?  ?Problem: Respiratory: ?Goal: Ability to maintain a clear airway will improve ?Outcome: Progressing ?  ?Problem: Respiratory: ?Goal: Levels of oxygenation will improve ?Outcome: Progressing ?  ?

## 2023-10-04 NOTE — Progress Notes (Signed)
Mobility Specialist - Progress Note   10/04/23 1141  Mobility  Activity Stood at bedside  Level of Assistance Contact guard assist, steadying assist  Assistive Device None  Distance Ambulated (ft) 2 ft  Activity Response Tolerated well  $Mobility charge 1 Mobility  Mobility Specialist Start Time (ACUTE ONLY) 1110  Mobility Specialist Stop Time (ACUTE ONLY) 1123  Mobility Specialist Time Calculation (min) (ACUTE ONLY) 13 min   MS responding to bed alarm. Pt standing at bedside upon entry, [place] disconnected from wall. Pt required max redirection and VC to return EOB. Pt returned supine MaxA, left with alarm set and needs within reach. Pt reconnected to [place], O2 >90%. RN present at bedside.   Zetta Bills Mobility Specialist 10/04/23 11:44 AM

## 2023-10-04 NOTE — Progress Notes (Signed)
Physical Therapy Treatment Patient Details Name: [name] MRN: [id number] DOB: 1946/02/07 Today's Date: 10/04/2023   History of Present Illness Pt is a 77 y/o F admitted on 10/01/23 after presenting with c/o SOB with intermittent chest pain. Pt is being treated with acute on chronic hypoxic hypercapnic respiratory failure. PMH: CAD, hypothyroid, advanced COPD, chronic respiratory failure on 3L O2, HLD, HTN, R toe amputations    PT Comments  Patient with increased agitation/impulsivity this date; activity/mobility limited as result.  Very distractible by both internal/external environment; difficulty sequencing task as a result (often forgets what components of task are done/remain)  Constant +1 physical assist for all functional activities; does require UE support for external stabilization.    If plan is discharge home, recommend the following: A little help with walking and/or transfers;A little help with bathing/dressing/bathroom;Assistance with cooking/housework;Assist for transportation;Help with stairs or ramp for entrance   Can travel by private vehicle     No  Equipment Recommendations       Recommendations for Other Services       Precautions / Restrictions Precautions Precautions: Fall Restrictions Weight Bearing Restrictions: No     Mobility  Bed Mobility Overal bed mobility: Needs Assistance Bed Mobility: Supine to Sit, Sit to Supine     Supine to sit: Min assist Sit to supine: Min assist        Transfers Overall transfer level: Needs assistance   Transfers: Sit to/from Stand, Bed to chair/wheelchair/BSC Sit to Stand: Min assist Stand pivot transfers: Min assist         General transfer comment: requires UE support for external stabilization with all upright activity    Ambulation/Gait               General Gait Details: deferred due to AMS, impulsivity/agitation   Stairs             Wheelchair Mobility     Tilt Bed     Modified Rankin (Stroke Patients Only)       Balance Overall balance assessment: Needs assistance Sitting-balance support: No upper extremity supported, Feet supported Sitting balance-Leahy Scale: Fair     Standing balance support: Bilateral upper extremity supported Standing balance-Leahy Scale: Poor                              Cognition Arousal: Alert Behavior During Therapy: WFL for tasks assessed/performed Overall Cognitive Status: Difficult to assess                                 General Comments: Appears anxious and impulsive; limited insight/awareness into location, need for assist        Exercises Other Exercises Other Exercises: Toilet transfer, SPT without assist device, min assist; R lateral lean/LOB at times, min assist from therapist to correct/recover.  Patient very distractible by both internal/external environment; difficulty sequencing task as a result (often forgets what components of task are done/remain)    General Comments        Pertinent Vitals/Pain Pain Assessment Pain Assessment: No/denies pain    Home Living                          Prior Function            PT Goals (current goals can now be found in the care plan section) Acute  Rehab PT Goals Patient Stated Goal: none stated PT Goal Formulation: With patient Time For Goal Achievement: 10/16/23 Potential to Achieve Goals: Fair Progress towards PT goals: Not progressing toward goals - comment (limited by agitation, SOB, general fatigue)    Frequency    Min 1X/week      PT Plan      Co-evaluation              AM-PAC PT "6 Clicks" Mobility   Outcome Measure  Help needed turning from your back to your side while in a flat bed without using bedrails?: None Help needed moving from lying on your back to sitting on the side of a flat bed without using bedrails?: A Little Help needed moving to and from a bed to a chair (including a  wheelchair)?: A Little Help needed standing up from a chair using your arms (e.g., wheelchair or bedside chair)?: A Little Help needed to walk in hospital room?: A Lot Help needed climbing 3-5 steps with a railing? : A Lot 6 Click Score: 17    End of Session Equipment Utilized During Treatment: Oxygen Activity Tolerance: Patient limited by fatigue;Treatment limited secondary to agitation Patient left: in bed;with call bell/phone within reach;with bed alarm set   PT Visit Diagnosis: Unsteadiness on feet (R26.81);History of falling (Z91.81);Other abnormalities of gait and mobility (R26.89);Muscle weakness (generalized) (M62.81);Difficulty in walking, not elsewhere classified (R26.2)     Time: 2130-8657 PT Time Calculation (min) (ACUTE ONLY): 10 min  Charges:    $Therapeutic Activity: 8-22 mins PT General Charges $$ ACUTE PT VISIT: 1 Visit                    Chandani Rogowski H. Manson Passey, PT, DPT, NCS 10/04/23, 1:17 PM [phone]

## 2023-10-05 DIAGNOSIS — Z7189 Other specified counseling: Secondary | ICD-10-CM | POA: Diagnosis not present

## 2023-10-05 DIAGNOSIS — R0602 Shortness of breath: Secondary | ICD-10-CM | POA: Diagnosis not present

## 2023-10-05 LAB — BASIC METABOLIC PANEL
Anion gap: 11 (ref 5–15)
BUN: 21 mg/dL (ref 8–23)
CO2: 37 mmol/L — ABNORMAL HIGH (ref 22–32)
Calcium: 8.9 mg/dL (ref 8.9–10.3)
Chloride: 96 mmol/L — ABNORMAL LOW (ref 98–111)
Creatinine, Ser: 0.63 mg/dL (ref 0.44–1.00)
GFR, Estimated: >60 mL/min (ref >60)
Glucose, Bld: 96 mg/dL (ref 70–99)
Potassium: 3.9 mmol/L (ref 3.5–5.1)
Sodium: 144 mmol/L (ref 135–145)

## 2023-10-05 LAB — GLUCOSE, CAPILLARY: Glucose-Capillary: 90 mg/dL (ref 70–99)

## 2023-10-05 NOTE — Progress Notes (Addendum)
Daily Progress Note   Patient Name: [name]       Date: 10/05/2023 DOB: 1946/03/16  Age: 77 y.o. MRN#: [id number] Attending Physician: Tresa Moore, MD Primary Care Physician: [name], MD Admit Date: 10/01/2023  Reason for Consultation/Follow-up: Establishing goals of care  Subjective: Notes and labs reviewed.  Patient is extremely hard of hearing and unable to have a goals of care conversation herself. She is confused and is repeating a word I am unable to understand.   Called to speak with patient's H POA Shawn.  Ines Bloomer states that at baseline, patient is estranged from her children and grandchildren as per her own wishes.  H POA states patient has been verbally abusive to her family. Ines Bloomer states she had been married to patient's son Carollee Herter but they are divorced.  She states she is H POA, and her son (patient's grandson) is durable POA.  She states they make decisions together and still speak with other family members regarding decisions prior to formalizing them.  She states patient has another son Kipp Brood. She states her son Crist Infante goes by several times a week to check on her and the patient has a paid caregiver that comes in daily to clean and to cook.    She states during a previous hospitalization the family discussed goals of care, and the family made her a DNR.  She states the patient's lucidity returned and patient reverted herself back to full code/full scope.  She states at baseline the patient just sits on the couch in the same place and does not really move.  She states she has a mini fridge that she accesses from sitting in the same place on the couch.  She states there is a bedside toilet that sometimes she will get up and sit on another time she will just use her  depends.  She discusses that patient has waxing and waning confusion, and is aware of dementia.  Per notes, patient will be discharging to SNF on Monday.  We discussed a family conversation with outpatient palliative when it is convenient for patient and family.    Length of Stay: 3  Current Medications: Scheduled Meds:   amLODipine  5 mg Oral Daily   arformoterol  15 mcg Nebulization BID   aspirin EC  81 mg Oral  Daily   atorvastatin  10 mg Oral Daily   enoxaparin (LOVENOX) injection  40 mg Subcutaneous Q24H   furosemide  20 mg Oral Daily   iron polysaccharides  150 mg Oral Daily   metoprolol tartrate  25 mg Oral BID   multivitamin with minerals  1 tablet Oral Daily   nicotine  14 mg Transdermal Daily   pantoprazole  40 mg Oral Daily   predniSONE  40 mg Oral Q breakfast   risperiDONE  0.25 mg Oral QHS   sodium chloride flush  3 mL Intravenous Q12H   thiamine  100 mg Oral Daily   traZODone  150 mg Oral QHS    Continuous Infusions:  ceFEPime (MAXIPIME) IV 2 g (10/05/23 0816)    PRN Meds: acetaminophen **OR** acetaminophen, bisacodyl, guaiFENesin, hydrALAZINE, ipratropium-albuterol, ondansetron **OR** ondansetron (ZOFRAN) IV, oxyCODONE-acetaminophen **AND** oxyCODONE, polyethylene glycol  Physical Exam Pulmonary:     Effort: Pulmonary effort is normal.  Neurological:     Mental Status: She is alert.             Vital Signs: BP (!) 145/76 (BP Location: Right Arm)   Pulse 88   Temp 97.9 F (36.6 C)   Resp 16   Ht 5\' 4"  (1.626 m)   Wt 47.1 kg   SpO2 (!) 42%   BMI 17.82 kg/m  SpO2: SpO2: (!) 42 % O2 Device: O2 Device: Nasal Cannula O2 Flow Rate: O2 Flow Rate (L/min): 4 L/min  Intake/output summary:  Intake/Output Summary (Last 24 hours) at 10/05/2023 1526 Last data filed at 10/05/2023 0758 Gross per 24 hour  Intake 120 ml  Output --  Net 120 ml   LBM: Last BM Date :  (pta) Baseline Weight: Weight: 40 kg Most recent weight: Weight: 47.1 kg   Patient  Active Problem List   Diagnosis Date Noted   Hyperthyroidism 10/02/2023   SOB (shortness of breath) 10/01/2023   Dementia with behavioral disturbance (HCC) 05/11/2023   COPD (chronic obstructive pulmonary disease) (HCC) 05/09/2023   Sinus tachycardia 02/24/2023   CAD (coronary artery disease) 01/30/2023   Normocytic anemia 01/30/2023   Compression fracture of body of thoracic vertebra (HCC) 01/30/2023   Underweight 11/08/2022   Acute on chronic respiratory failure with hypoxia and hypercapnia (HCC) 06/30/2022   Dyslipidemia 06/30/2022   Subacute osteomyelitis of right foot (HCC)    Hypertension    Protein-calorie malnutrition, severe (HCC) 11/21/2021   COPD with acute exacerbation (HCC) 11/21/2021   COPD exacerbation (HCC) 11/20/2021   Chronic pain - multiple sites arthritis 11/20/2021   Benzodiazepine dependence, continuous (HCC) 11/20/2021   Peripheral vascular disease (HCC) 11/20/2021   Hyponatremia 11/20/2021   Hypocalcemia 11/20/2021   Hypoalbuminemia due to protein-calorie malnutrition (HCC) 11/20/2021   Benzodiazepine withdrawal without complication (HCC)    Malnutrition of moderate degree 10/20/2021   Absent pedal pulses 10/19/2021   Chronic hip pain (Left) 02/17/2021   Hip pain, acute (Left) 02/17/2021   Chronic use of opiate for therapeutic purpose 02/16/2021   Closed fracture of hip, sequela (Left) 02/07/2021   Fracture of femoral neck, left, closed (HCC) 01/11/2021   Chronic respiratory failure with hypoxia (HCC) 01/11/2021   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 (HCC) 12/22/2020   PAD (peripheral artery disease) (HCC) 12/22/2020   Encounter for long-term opiate analgesic use 11/11/2020   Dry gangrene (HCC) of middle toe, right foot 11/11/2020   Foot pain, right 11/11/2020   Hard of hearing 11/11/2020   Atherosclerosis of native arteries of the  extremities with gangrene (HCC) 12/09/2019   Vitamin D deficiency 11/12/2019   Pharmacologic therapy 06/04/2019    Disorder of skeletal system 06/04/2019   Problems influencing health status 06/04/2019   Compression fracture of L3 vertebra (HCC) 08/19/2018   Chronic hip pain (Bilateral) 12/31/2017   Neurogenic pain 08/29/2017   Chronic low back pain (1ry area of Pain) (Right) w/o sciatica 08/29/2017   Chronic sacroiliac joint pain (Right) 06/05/2017   Vitamin D insufficiency 03/12/2017   Right hip pain 02/20/2017   Intertrochanteric fracture of right hip, sequela 02/20/2017   B12 deficiency 02/05/2017   Pressure injury of skin 10/02/2016   Closed displaced intertrochanteric fracture of right femur (HCC) 10/02/2016   Hip fracture (HCC) 10/01/2016   Cervical facet hypertrophy (Bilateral) 05/27/2016   History of shoulder surgery 5 (Right) 05/27/2016   Lumbar foraminal stenosis (L3-4) (Left) 05/27/2016   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) 05/27/2016   Lumbar facet hypertrophy (Bilateral) 05/27/2016   Lumbar facet syndrome (Right) 05/27/2016   Lumbar grade 1 Anterolisthesis of L3 over L4 05/27/2016   Chronic shoulder pain (Bilateral) (status post multiple surgeries) (R>L) 12/08/2015   Substance use disorder Risk: High 09/14/2015   Chronic pain syndrome 09/14/2015   Cervical spondylosis 09/14/2015   Chronic neck pain (2ry area of Pain) (Bilateral) (R>L) 09/14/2015   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) 09/14/2015   Cervical facet syndrome (Bilateral) (R>L) 09/14/2015   Cervical myofascial pain syndrome 09/14/2015   Lumbar spondylosis 09/14/2015   Chronic shoulder impingement syndrome (Right) 09/14/2015   CRP elevated 09/14/2015   Elevated sedimentation rate 09/14/2015   Chronic obstructive pulmonary disease (COPD) (HCC) 09/14/2015   Chronic shoulder pain (Right) 09/14/2015   Abnormal nerve conduction studies 09/14/2015   Encounter for therapeutic drug level monitoring 09/09/2015   Long term current use of opiate analgesic 09/09/2015   Long term  prescription opiate use 09/09/2015   Uncomplicated opioid dependence (HCC) 09/09/2015   Opiate use 09/09/2015   Anxiety and depression 07/11/2012   Tobacco use disorder 05/27/2011   Fatigue 05/27/2011   Hypothyroidism 11/25/2010   HLD (hyperlipidemia) 08/24/2010   Tachycardia 08/24/2010   DYSPNEA 08/24/2010    Palliative Care Assessment & Plan     Recommendations/Plan: Patient very hard of hearing and unable to have a goals of care conversation.  Patient is H POA states that she and other family members had made patient a DNR, but patient reverted back to full code/full scope. Discussed having outpatient palliative to follow for goals of care conversation when it is convenient for patient and family. Recommend outpatient palliative.  Code Status:    Code Status Orders  (From admission, onward)           Start     Ordered   10/01/23 1913  Full code  Continuous       Question:  By:  Answer:  Other   10/01/23 1915           Code Status History     Date Active Date Inactive Code Status Order ID Comments User Context   05/06/2023 1757 05/21/2023 1900 Full Code [id number]  Lovenia Kim, DO ED   02/24/2023 1443 03/02/2023 2253 DNR [id number]  Lorretta Harp, MD ED   02/04/2023 0850 02/04/2023 1657 DNR [id number]  Darlin Priestly, MD Inpatient   02/02/2023 1636 02/04/2023 0850 Full Code [id number]  Darlin Priestly, MD Inpatient   01/30/2023 1128 02/02/2023 1636 DNR [id number]  Lorretta Harp, MD ED  01/11/2023 1605 01/15/2023 2228 DNR [id number]  Sunnie Nielsen, DO ED   01/11/2023 0040 01/11/2023 1605 Full Code [id number]  Rometta Emery, MD ED   11/08/2022 1428 11/11/2022 0103 Full Code [id number]  Verdene Lennert, MD ED   06/30/2022 0210 07/08/2022 2020 Full Code [id number]  Mansy, Vernetta Honey, MD ED   11/20/2021 1245 11/25/2021 1934 Full Code [id number]  Sunnie Nielsen, DO ED   10/19/2021 0039 10/23/2021 1929 Full Code [id number]  Gertha Calkin, MD ED   04/22/2021 1047 04/22/2021 1718 Full Code [id number]  Gwyneth Revels, DPM Inpatient   01/11/2021 2147 01/20/2021 2152 Full Code [id number]  Charlsie Quest, MD ED   12/22/2020 1123 01/01/2021 2345 Full Code [id number]  Lucile Shutters, MD ED   10/22/2018 0225 10/24/2018 1650 Full Code [id number]  Oralia Manis, MD Inpatient   08/18/2018 0248 08/21/2018 2036 Full Code [id number]  Cammy Copa, MD Inpatient   10/01/2016 1138 10/04/2016 1744 Full Code [id number]  Enedina Finner, MD Inpatient      Advance Directive Documentation    Flowsheet Row Most Recent Value  Type of Advance Directive Healthcare Power of Attorney, Living will  Pre-existing out of facility DNR order (yellow form or pink MOST form) --  "MOST" Form in Place? --        Thank you for allowing the Palliative Medicine Team to assist in the care of this patient.   Morton Stall, NP  Please contact Palliative Medicine Team phone at [phone] for questions and concerns.

## 2023-10-05 NOTE — Progress Notes (Signed)
PROGRESS NOTE    [name] [facility]  JWJ:[id number] DOB: [birth year] DOA: 10/01/2023 PCP: [name], MD    Brief Narrative:   [name] is a 77 y.o. female with medical history significant for CAD, hypothyroid, advanced COPD seen by pulmonary [name], chronic respiratory failure with 3 L of oxygen nasal cannula at home, hyperlipidemia, hypertension, who presents to the emergency department for sob that started acutely today with hypoxia/ hypercapnia on bipap, with reports of intermittent chest pain currently with elevated troponin of 97 and EKG showing regular fast rhythm which is most likely atrial flutter repeat EKG is pending, CTA chest is ordered as patient is high risk for VTE with her history of reduction internal fixation and displaced intertrochanteric right hip fracture, also history of PAD followed by vascular Dr. [name] and history of amputations of the second, third and fourth MTP, chest xray is negative  with white count of  12.3. Admission requested for suspected COPD exacerbation. In emergency room vitals trend shows patient meeting SIRS criteria infection is likely but not suspected.   Assessment & Plan:   Principal Problem:   SOB (shortness of breath) Active Problems:   Chronic pain syndrome   COPD exacerbation (HCC)   Acute on chronic respiratory failure with hypoxia and hypercapnia (HCC)   CAD (coronary artery disease)   Hypertension   Underweight   Hypothyroidism   Protein-calorie malnutrition, severe (HCC)   Opiate use   PAD (peripheral artery disease) (HCC)   COPD with acute exacerbation (HCC)   Hyperthyroidism  # Acute on chronic hypoxic hypercapnic respiratory failure With elevated co2 and encephalopathy overnight, required bipap, now weaned back to baseline 3 liters and no tachypnea Plan: Continue COPD treatment as below   # COPD, severe, with acute exacerbation No PE or focal infiltrate on CXR. Pseudomonas respiratory culture last year.   Plan: Aggressive bronchodilator therapy Continue 40 mg daily x 5-day course Complete empiric antibiotic  # PAD # Prior right toe amputations Has ulcer on dorsum of right first toe -Patient refused ABI, will recommend outpatient follow-up   # Foley catheter Per nursing placed last night per MD order but reason why is unclear, no report of retention -Patient voiding   # CAD # Tropinemia Denies chest pain, trops mildly elevated and flat, no ischemic changes on EKG. Suspect demand. Had relatively benign TTE in April of this year - monitor - home aspirin, statin, metop   # HFpEF Appears euvolemic - home metop, lasix   # GAD Home xanax   # Dementia With history behavioral disturbance - delirium precautions - home trazodone, risperdal (but will lower trazodone dose from 300 to 150)   # Chronic pain - cont home percocet   # HTN Mild elevation today - resume home amlodipine, metop   # Thyroid Carries diagnosis of both hypothyroidism and hyperthyroidism. Currently on levothyroxine. TSH is low and t4 is elevated - advise holding levothyroxine and repeating TFTs outpatient in 4-6 wks   # Debility PT consult   # Severe malnutrition - RD consult -Palliative care consult      DVT prophylaxis: SQ Lovenox Code Status: Full Family Communication: None Disposition Plan: Status is: Inpatient Remains inpatient appropriate because: Severe COPD flare   Level of care: Med-Surg  Consultants:  None, pulmonary signed off  Procedures:  None  Antimicrobials: Meropenem   Subjective: Seen and examined.  In better spirits this morning.  Objective: Vitals:   10/04/23 2012 10/05/23 6213 10/05/23 0745 10/05/23 0865  BP:  (!) 154/70  (!) 145/76  Pulse:  87  88  Resp:  (!) 22  16  Temp:  98.2 F (36.8 C)  97.9 F (36.6 C)  TempSrc:  Axillary    SpO2: 96% 99% 96% (!) 42%  Weight:      Height:        Intake/Output Summary (Last 24 hours) at 10/05/2023 1250 Last  data filed at 10/05/2023 0758 Gross per 24 hour  Intake 120 ml  Output --  Net 120 ml    Filed Weights   10/02/23 1532 10/03/23 0404 10/04/23 0428  Weight: 42.9 kg 47.4 kg 47.1 kg    Examination:  General exam: NAD.  Appears frail and chronically ill.  Hard of hearing. Respiratory system: Bibasilar crackles.  Decreased air entry.  No wheeze.  3 L Cardiovascular system: S1-S2, RRR, no murmurs, no pedal edema Gastrointestinal system: Thin, soft, NT/ND, normal bowel sounds Central nervous system: Alert and oriented. No focal neurological deficits. Extremities: Symmetric 5 x 5 power. Skin: No rashes, lesions or ulcers Psychiatry: Judgement and insight appear normal. Mood & affect appropriate.     Data Reviewed: I have personally reviewed following labs and imaging studies  CBC: Recent Labs  Lab 10/01/23 1401 10/02/23 0438  WBC 12.3* 10.8*  HGB 12.3 10.5*  HCT 41.2 34.9*  MCV 97.4 94.1  PLT 353 287   Basic Metabolic Panel: Recent Labs  Lab 10/01/23 1401 10/02/23 0438 10/03/23 0433 10/04/23 0536 10/05/23 0332  NA 142 143 137 138 144  K 3.6 3.4* 3.5 2.9* 3.9  CL 96* 96* 93* 91* 96*  CO2 39* 37* 37* 37* 37*  GLUCOSE 115* 137* 100* 100* 96  BUN 42* 41* 40* 26* 21  CREATININE 0.82 0.82 0.83 0.66 0.63  CALCIUM 8.6* 8.5* 8.4* 8.8* 8.9  MG  --   --  2.1  --   --   PHOS  --   --  3.2  --   --    GFR: Estimated Creatinine Clearance: 44.5 mL/min (by C-G formula based on SCr of 0.63 mg/dL). Liver Function Tests: Recent Labs  Lab 10/02/23 0438  AST 11*  ALT 14  ALKPHOS 57  BILITOT 0.7  PROT 6.4*  ALBUMIN 3.3*   No results for input(s): "LIPASE", "AMYLASE" in the last 168 hours. No results for input(s): "AMMONIA" in the last 168 hours. Coagulation Profile: No results for input(s): "INR", "PROTIME" in the last 168 hours. Cardiac Enzymes: No results for input(s): "CKTOTAL", "CKMB", "CKMBINDEX", "TROPONINI" in the last 168 hours. BNP (last 3 results) No results  for input(s): "PROBNP" in the last 8760 hours. HbA1C: No results for input(s): "HGBA1C" in the last 72 hours. CBG: Recent Labs  Lab 10/03/23 2321 10/04/23 0405 10/04/23 0431 10/04/23 0438 10/04/23 1408  GLUCAP 104* 68* 62* 91 130*   Lipid Profile: No results for input(s): "CHOL", "HDL", "LDLCALC", "TRIG", "CHOLHDL", "LDLDIRECT" in the last 72 hours. Thyroid Function Tests: No results for input(s): "TSH", "T4TOTAL", "FREET4", "T3FREE", "THYROIDAB" in the last 72 hours.  Anemia Panel: No results for input(s): "VITAMINB12", "FOLATE", "FERRITIN", "TIBC", "IRON", "RETICCTPCT" in the last 72 hours. Sepsis Labs: No results for input(s): "PROCALCITON", "LATICACIDVEN" in the last 168 hours.  Recent Results (from the past 240 hour(s))  Resp panel by RT-PCR (RSV, Flu A&B, Covid) Anterior Nasal Swab     Status: None   Collection Time: 10/01/23  2:50 PM   Specimen: Anterior Nasal Swab  Result Value Ref Range Status  SARS Coronavirus 2 by RT PCR NEGATIVE NEGATIVE Final    Comment: (NOTE) SARS-CoV-2 target nucleic acids are NOT DETECTED.  The SARS-CoV-2 RNA is generally detectable in upper respiratory specimens during the acute phase of infection. The lowest concentration of SARS-CoV-2 viral copies this assay can detect is 138 copies/mL. A negative result does not preclude SARS-Cov-2 infection and should not be used as the sole basis for treatment or other patient management decisions. A negative result may occur with  improper specimen collection/handling, submission of specimen other than nasopharyngeal swab, presence of viral mutation(s) within the areas targeted by this assay, and inadequate number of viral copies(<138 copies/mL). A negative result must be combined with clinical observations, patient history, and epidemiological information. The expected result is Negative.  Fact Sheet for Patients:  BloggerCourse.com  Fact Sheet for Healthcare Providers:   SeriousBroker.it  This test is no t yet approved or cleared by the [place] FDA and  has been authorized for detection and/or diagnosis of SARS-CoV-2 by FDA under an Emergency Use Authorization (EUA). This EUA will remain  in effect (meaning this test can be used) for the duration of the COVID-19 declaration under Section 564(b)(1) of the Act, 21 U.S.C.section 360bbb-3(b)(1), unless the authorization is terminated  or revoked sooner.       Influenza A by PCR NEGATIVE NEGATIVE Final   Influenza B by PCR NEGATIVE NEGATIVE Final    Comment: (NOTE) The Xpert Xpress SARS-CoV-2/FLU/RSV plus assay is intended as an aid in the diagnosis of influenza from Nasopharyngeal swab specimens and should not be used as a sole basis for treatment. Nasal washings and aspirates are unacceptable for Xpert Xpress SARS-CoV-2/FLU/RSV testing.  Fact Sheet for Patients: BloggerCourse.com  Fact Sheet for Healthcare Providers: SeriousBroker.it  This test is not yet approved or cleared by the [place] FDA and has been authorized for detection and/or diagnosis of SARS-CoV-2 by FDA under an Emergency Use Authorization (EUA). This EUA will remain in effect (meaning this test can be used) for the duration of the COVID-19 declaration under Section 564(b)(1) of the Act, 21 U.S.C. section 360bbb-3(b)(1), unless the authorization is terminated or revoked.     Resp Syncytial Virus by PCR NEGATIVE NEGATIVE Final    Comment: (NOTE) Fact Sheet for Patients: BloggerCourse.com  Fact Sheet for Healthcare Providers: SeriousBroker.it  This test is not yet approved or cleared by the [place] FDA and has been authorized for detection and/or diagnosis of SARS-CoV-2 by FDA under an Emergency Use Authorization (EUA). This EUA will remain in effect (meaning this test can be used) for  the duration of the COVID-19 declaration under Section 564(b)(1) of the Act, 21 U.S.C. section 360bbb-3(b)(1), unless the authorization is terminated or revoked.  Performed at [facility], [address] [place], [place] [address]   Respiratory (~20 pathogens) panel by PCR     Status: None   Collection Time: 10/02/23  8:38 AM   Specimen: Nasopharyngeal Swab; Respiratory  Result Value Ref Range Status   Adenovirus NOT DETECTED NOT DETECTED Final   Coronavirus 229E NOT DETECTED NOT DETECTED Final    Comment: (NOTE) The Coronavirus on the Respiratory Panel, DOES NOT test for the novel  Coronavirus (2019 nCoV)    Coronavirus HKU1 NOT DETECTED NOT DETECTED Final   Coronavirus NL63 NOT DETECTED NOT DETECTED Final   Coronavirus OC43 NOT DETECTED NOT DETECTED Final   Metapneumovirus NOT DETECTED NOT DETECTED Final   Rhinovirus / Enterovirus NOT DETECTED NOT DETECTED Final   Influenza  A NOT DETECTED NOT DETECTED Final   Influenza B NOT DETECTED NOT DETECTED Final   Parainfluenza Virus 1 NOT DETECTED NOT DETECTED Final   Parainfluenza Virus 2 NOT DETECTED NOT DETECTED Final   Parainfluenza Virus 3 NOT DETECTED NOT DETECTED Final   Parainfluenza Virus 4 NOT DETECTED NOT DETECTED Final   Respiratory Syncytial Virus NOT DETECTED NOT DETECTED Final   Bordetella pertussis NOT DETECTED NOT DETECTED Final   Bordetella Parapertussis NOT DETECTED NOT DETECTED Final   Chlamydophila pneumoniae NOT DETECTED NOT DETECTED Final   Mycoplasma pneumoniae NOT DETECTED NOT DETECTED Final    Comment: Performed at [facility], [address], [place], [place] [address]  MRSA Next Gen by PCR, Nasal     Status: Abnormal   Collection Time: 10/03/23  7:19 AM   Specimen: Nasal Mucosa; Nasal Swab  Result Value Ref Range Status   MRSA by PCR Next Gen DETECTED (A) NOT DETECTED Final    Comment: RESULT CALLED TO, READ BACK BY AND VERIFIED WITH: Blanchard Mane RN [phone] 10/03/23 HNM (NOTE) The  GeneXpert MRSA Assay (FDA approved for NASAL specimens only), is one component of a comprehensive MRSA colonization surveillance program. It is not intended to diagnose MRSA infection nor to guide or monitor treatment for MRSA infections. Test performance is not FDA approved in patients less than 65 years old. Performed at [facility], [address] [place], [place] [address]          Radiology Studies: No results found.      Scheduled Meds:  amLODipine  5 mg Oral Daily   arformoterol  15 mcg Nebulization BID   aspirin EC  81 mg Oral Daily   atorvastatin  10 mg Oral Daily   enoxaparin (LOVENOX) injection  40 mg Subcutaneous Q24H   furosemide  20 mg Oral Daily   iron polysaccharides  150 mg Oral Daily   metoprolol tartrate  25 mg Oral BID   multivitamin with minerals  1 tablet Oral Daily   nicotine  14 mg Transdermal Daily   pantoprazole  40 mg Oral Daily   predniSONE  40 mg Oral Q breakfast   risperiDONE  0.25 mg Oral QHS   sodium chloride flush  3 mL Intravenous Q12H   thiamine  100 mg Oral Daily   traZODone  150 mg Oral QHS   Continuous Infusions:  ceFEPime (MAXIPIME) IV 2 g (10/05/23 0816)     LOS: 3 days     Tresa Moore, MD [facility]   If 7PM-7AM, please contact night-coverage  10/05/2023, 12:50 PM

## 2023-10-05 NOTE — TOC Progression Note (Signed)
Transition of Care [facility]) - Progression Note    Patient Details  Name: [name] MRN: [id number] Date of Birth: [birth year]  [facility] of [facility]) CM/SW Contact  Chapman Fitch, RN Phone Number: 10/05/2023, 10:47 AM  Clinical Narrative:     Per MD anticipated patient will medically be ready for SNF tomorrow.   Per Elmarie Shiley at [facility] they do no accept patients over the weekend, and patient will have to admit on Monday.  MD updated Auth valid through 11/20  Expected Discharge Plan: Skilled Nursing Facility    Expected Discharge Plan and Services                                               Social Determinants of Health (SDOH) Interventions SDOH Screenings   Food Insecurity: No Food Insecurity (10/02/2023)  Housing: Low Risk  (10/02/2023)  Transportation Needs: No Transportation Needs (10/02/2023)  Utilities: Not At Risk (10/02/2023)  Depression (PHQ2-9): Low Risk  (07/13/2021)  Tobacco Use: High Risk (10/01/2023)    Readmission Risk Interventions    10/03/2023   10:51 AM 01/31/2023   11:49 AM 07/05/2022   10:50 AM  Readmission Risk Prevention Plan  Transportation Screening Complete Complete Complete  Medication Review Oceanographer) Complete Complete Complete  PCP or Specialist appointment within 3-5 days of discharge  Complete   HRI or Home Care Consult   Complete  SW Recovery Care/Counseling Consult Complete Complete Complete  Palliative Care Screening Not Applicable Not Applicable   Skilled Nursing Facility Complete Not Applicable Not Applicable

## 2023-10-06 DIAGNOSIS — R0602 Shortness of breath: Secondary | ICD-10-CM | POA: Diagnosis not present

## 2023-10-06 LAB — BASIC METABOLIC PANEL
Anion gap: 11 (ref 5–15)
BUN: 22 mg/dL (ref 8–23)
CO2: 37 mmol/L — ABNORMAL HIGH (ref 22–32)
Calcium: 9.1 mg/dL (ref 8.9–10.3)
Chloride: 95 mmol/L — ABNORMAL LOW (ref 98–111)
Creatinine, Ser: 0.67 mg/dL (ref 0.44–1.00)
GFR, Estimated: >60 mL/min (ref >60)
Glucose, Bld: 84 mg/dL (ref 70–99)
Potassium: 3.7 mmol/L (ref 3.5–5.1)
Sodium: 143 mmol/L (ref 135–145)

## 2023-10-06 LAB — GLUCOSE, CAPILLARY
Glucose-Capillary: 57 mg/dL — ABNORMAL LOW (ref 70–99)
Glucose-Capillary: 101 mg/dL — ABNORMAL HIGH (ref 70–99)
Glucose-Capillary: 70 mg/dL (ref 70–99)

## 2023-10-06 MED ORDER — PREDNISONE 20 MG PO TABS
20.0000 mg | ORAL_TABLET | Freq: Every day | ORAL | Status: AC
  Administered 2023-10-06 – 2023-10-08 (×3): 20 mg via ORAL
  Filled 2023-10-06 (×3): qty 1

## 2023-10-06 MED ORDER — LEVOFLOXACIN 750 MG PO TABS
750.0000 mg | ORAL_TABLET | Freq: Every day | ORAL | Status: AC
  Administered 2023-10-06 – 2023-10-07 (×2): 750 mg via ORAL
  Filled 2023-10-06 (×2): qty 1

## 2023-10-06 NOTE — Progress Notes (Signed)
PROGRESS NOTE    [name] [facility]  PPI:[id number] DOB: [birth year] DOA: 10/01/2023 PCP: [name], MD    Brief Narrative:   [name] is a 77 y.o. female with medical history significant for CAD, hypothyroid, advanced COPD seen by pulmonary [name], chronic respiratory failure with 3 L of oxygen nasal cannula at home, hyperlipidemia, hypertension, who presents to the emergency department for sob that started acutely today with hypoxia/ hypercapnia on bipap, with reports of intermittent chest pain currently with elevated troponin of 97 and EKG showing regular fast rhythm which is most likely atrial flutter repeat EKG is pending, CTA chest is ordered as patient is high risk for VTE with her history of reduction internal fixation and displaced intertrochanteric right hip fracture, also history of PAD followed by vascular Dr. [name] and history of amputations of the second, third and fourth MTP, chest xray is negative  with white count of  12.3. Admission requested for suspected COPD exacerbation. In emergency room vitals trend shows patient meeting SIRS criteria infection is likely but not suspected.   Assessment & Plan:   Principal Problem:   SOB (shortness of breath) Active Problems:   Chronic pain syndrome   COPD exacerbation (HCC)   Acute on chronic respiratory failure with hypoxia and hypercapnia (HCC)   CAD (coronary artery disease)   Hypertension   Underweight   Hypothyroidism   Protein-calorie malnutrition, severe (HCC)   Opiate use   PAD (peripheral artery disease) (HCC)   COPD with acute exacerbation (HCC)   Hyperthyroidism  # Acute on chronic hypoxic hypercapnic respiratory failure With elevated co2 and encephalopathy overnight, required bipap, now weaned back to baseline 3 liters and no tachypnea Plan: Continue COPD treatment as below   # COPD, severe, with acute exacerbation No PE or focal infiltrate on CXR. Pseudomonas respiratory culture last year.   Plan: Bronchodilators Wean prednisone slowly.  Start 20 mg a day from today Empiric antibiotic, completed course  # PAD # Prior right toe amputations Has ulcer on dorsum of right first toe -Patient refused ABI, will recommend outpatient follow-up   # Foley catheter Per nursing placed last night per MD order but reason why is unclear, no report of retention -Patient voiding, periodic bladder scans   # CAD # Tropinemia Denies chest pain, trops mildly elevated and flat, no ischemic changes on EKG. Suspect demand. Had relatively benign TTE in April of this year - monitor - home aspirin, statin, metop   # HFpEF Appears euvolemic - home metop, lasix   # GAD Home xanax   # Dementia With history behavioral disturbance - delirium precautions - home trazodone, risperdal (but will lower trazodone dose from 300 to 150)   # Chronic pain - cont home percocet   # HTN Mild elevation today - resume home amlodipine, metop   # Thyroid Carries diagnosis of both hypothyroidism and hyperthyroidism. Currently on levothyroxine. TSH is low and t4 is elevated - advise holding levothyroxine and repeating TFTs outpatient in 4-6 wks   # Debility PT consult   # Severe malnutrition - RD consult -Palliative care consult      DVT prophylaxis: SQ Lovenox Code Status: Full Family Communication: None Disposition Plan: Status is: Inpatient Remains inpatient appropriate because: Severe COPD flare   Level of care: Med-Surg  Consultants:  None, pulmonary signed off  Procedures:  None  Antimicrobials: Meropenem   Subjective: Examined.  Sitting up in chair.  No visible distress  Objective: Vitals:   10/06/23  6295 10/06/23 0807 10/06/23 0854 10/06/23 0943  BP: 139/71  104/70   Pulse: 94  (!) 113 89  Resp: 16  20   Temp: 97.9 F (36.6 C)  (!) 97.5 F (36.4 C)   TempSrc:   Oral   SpO2: 100% 97% (!) 88% 99%  Weight:      Height:       No intake or output data in the 24  hours ending 10/06/23 1229   Filed Weights   10/02/23 1532 10/03/23 0404 10/04/23 0428  Weight: 42.9 kg 47.4 kg 47.1 kg    Examination:  General exam: No acute distress.  Appears frail and chronically ill.  Very hard of hearing Respiratory system: Scattered crackles bilaterally.  Decreased air entry.  No wheeze.  Normal work of breathing.  3 L Cardiovascular system: S1-S2, RRR, no murmurs, no pedal edema Gastrointestinal system: Thin, soft, NT/ND, normal bowel sounds Central nervous system: Alert and oriented. No focal neurological deficits. Extremities: Symmetric 5 x 5 power. Skin: No rashes, lesions or ulcers Psychiatry: Judgement and insight appear normal. Mood & affect appropriate.     Data Reviewed: I have personally reviewed following labs and imaging studies  CBC: Recent Labs  Lab 10/01/23 1401 10/02/23 0438  WBC 12.3* 10.8*  HGB 12.3 10.5*  HCT 41.2 34.9*  MCV 97.4 94.1  PLT 353 287   Basic Metabolic Panel: Recent Labs  Lab 10/02/23 0438 10/03/23 0433 10/04/23 0536 10/05/23 0332 10/06/23 0403  NA 143 137 138 144 143  K 3.4* 3.5 2.9* 3.9 3.7  CL 96* 93* 91* 96* 95*  CO2 37* 37* 37* 37* 37*  GLUCOSE 137* 100* 100* 96 84  BUN 41* 40* 26* 21 22  CREATININE 0.82 0.83 0.66 0.63 0.67  CALCIUM 8.5* 8.4* 8.8* 8.9 9.1  MG  --  2.1  --   --   --   PHOS  --  3.2  --   --   --    GFR: Estimated Creatinine Clearance: 44.5 mL/min (by C-G formula based on SCr of 0.67 mg/dL). Liver Function Tests: Recent Labs  Lab 10/02/23 0438  AST 11*  ALT 14  ALKPHOS 57  BILITOT 0.7  PROT 6.4*  ALBUMIN 3.3*   No results for input(s): "LIPASE", "AMYLASE" in the last 168 hours. No results for input(s): "AMMONIA" in the last 168 hours. Coagulation Profile: No results for input(s): "INR", "PROTIME" in the last 168 hours. Cardiac Enzymes: No results for input(s): "CKTOTAL", "CKMB", "CKMBINDEX", "TROPONINI" in the last 168 hours. BNP (last 3 results) No results for  input(s): "PROBNP" in the last 8760 hours. HbA1C: No results for input(s): "HGBA1C" in the last 72 hours. CBG: Recent Labs  Lab 10/04/23 1408 10/05/23 2116 10/06/23 0350 10/06/23 0438 10/06/23 0845  GLUCAP 130* 90 57* 101* 70   Lipid Profile: No results for input(s): "CHOL", "HDL", "LDLCALC", "TRIG", "CHOLHDL", "LDLDIRECT" in the last 72 hours. Thyroid Function Tests: No results for input(s): "TSH", "T4TOTAL", "FREET4", "T3FREE", "THYROIDAB" in the last 72 hours.  Anemia Panel: No results for input(s): "VITAMINB12", "FOLATE", "FERRITIN", "TIBC", "IRON", "RETICCTPCT" in the last 72 hours. Sepsis Labs: No results for input(s): "PROCALCITON", "LATICACIDVEN" in the last 168 hours.  Recent Results (from the past 240 hour(s))  Resp panel by RT-PCR (RSV, Flu A&B, Covid) Anterior Nasal Swab     Status: None   Collection Time: 10/01/23  2:50 PM   Specimen: Anterior Nasal Swab  Result Value Ref Range Status   SARS Coronavirus 2  by RT PCR NEGATIVE NEGATIVE Final    Comment: (NOTE) SARS-CoV-2 target nucleic acids are NOT DETECTED.  The SARS-CoV-2 RNA is generally detectable in upper respiratory specimens during the acute phase of infection. The lowest concentration of SARS-CoV-2 viral copies this assay can detect is 138 copies/mL. A negative result does not preclude SARS-Cov-2 infection and should not be used as the sole basis for treatment or other patient management decisions. A negative result may occur with  improper specimen collection/handling, submission of specimen other than nasopharyngeal swab, presence of viral mutation(s) within the areas targeted by this assay, and inadequate number of viral copies(<138 copies/mL). A negative result must be combined with clinical observations, patient history, and epidemiological information. The expected result is Negative.  Fact Sheet for Patients:  BloggerCourse.com  Fact Sheet for Healthcare Providers:   SeriousBroker.it  This test is no t yet approved or cleared by the [place] FDA and  has been authorized for detection and/or diagnosis of SARS-CoV-2 by FDA under an Emergency Use Authorization (EUA). This EUA will remain  in effect (meaning this test can be used) for the duration of the COVID-19 declaration under Section 564(b)(1) of the Act, 21 U.S.C.section 360bbb-3(b)(1), unless the authorization is terminated  or revoked sooner.       Influenza A by PCR NEGATIVE NEGATIVE Final   Influenza B by PCR NEGATIVE NEGATIVE Final    Comment: (NOTE) The Xpert Xpress SARS-CoV-2/FLU/RSV plus assay is intended as an aid in the diagnosis of influenza from Nasopharyngeal swab specimens and should not be used as a sole basis for treatment. Nasal washings and aspirates are unacceptable for Xpert Xpress SARS-CoV-2/FLU/RSV testing.  Fact Sheet for Patients: BloggerCourse.com  Fact Sheet for Healthcare Providers: SeriousBroker.it  This test is not yet approved or cleared by the [place] FDA and has been authorized for detection and/or diagnosis of SARS-CoV-2 by FDA under an Emergency Use Authorization (EUA). This EUA will remain in effect (meaning this test can be used) for the duration of the COVID-19 declaration under Section 564(b)(1) of the Act, 21 U.S.C. section 360bbb-3(b)(1), unless the authorization is terminated or revoked.     Resp Syncytial Virus by PCR NEGATIVE NEGATIVE Final    Comment: (NOTE) Fact Sheet for Patients: BloggerCourse.com  Fact Sheet for Healthcare Providers: SeriousBroker.it  This test is not yet approved or cleared by the [place] FDA and has been authorized for detection and/or diagnosis of SARS-CoV-2 by FDA under an Emergency Use Authorization (EUA). This EUA will remain in effect (meaning this test can be used) for  the duration of the COVID-19 declaration under Section 564(b)(1) of the Act, 21 U.S.C. section 360bbb-3(b)(1), unless the authorization is terminated or revoked.  Performed at [facility], [address] [place], [place] [address]   Respiratory (~20 pathogens) panel by PCR     Status: None   Collection Time: 10/02/23  8:38 AM   Specimen: Nasopharyngeal Swab; Respiratory  Result Value Ref Range Status   Adenovirus NOT DETECTED NOT DETECTED Final   Coronavirus 229E NOT DETECTED NOT DETECTED Final    Comment: (NOTE) The Coronavirus on the Respiratory Panel, DOES NOT test for the novel  Coronavirus (2019 nCoV)    Coronavirus HKU1 NOT DETECTED NOT DETECTED Final   Coronavirus NL63 NOT DETECTED NOT DETECTED Final   Coronavirus OC43 NOT DETECTED NOT DETECTED Final   Metapneumovirus NOT DETECTED NOT DETECTED Final   Rhinovirus / Enterovirus NOT DETECTED NOT DETECTED Final   Influenza A NOT DETECTED  NOT DETECTED Final   Influenza B NOT DETECTED NOT DETECTED Final   Parainfluenza Virus 1 NOT DETECTED NOT DETECTED Final   Parainfluenza Virus 2 NOT DETECTED NOT DETECTED Final   Parainfluenza Virus 3 NOT DETECTED NOT DETECTED Final   Parainfluenza Virus 4 NOT DETECTED NOT DETECTED Final   Respiratory Syncytial Virus NOT DETECTED NOT DETECTED Final   Bordetella pertussis NOT DETECTED NOT DETECTED Final   Bordetella Parapertussis NOT DETECTED NOT DETECTED Final   Chlamydophila pneumoniae NOT DETECTED NOT DETECTED Final   Mycoplasma pneumoniae NOT DETECTED NOT DETECTED Final    Comment: Performed at [facility], [address] [place], [place] [address]  MRSA Next Gen by PCR, Nasal     Status: Abnormal   Collection Time: 10/03/23  7:19 AM   Specimen: Nasal Mucosa; Nasal Swab  Result Value Ref Range Status   MRSA by PCR Next Gen DETECTED (A) NOT DETECTED Final    Comment: RESULT CALLED TO, READ BACK BY AND VERIFIED WITH: Blanchard Mane RN [phone] 10/03/23 HNM (NOTE) The  GeneXpert MRSA Assay (FDA approved for NASAL specimens only), is one component of a comprehensive MRSA colonization surveillance program. It is not intended to diagnose MRSA infection nor to guide or monitor treatment for MRSA infections. Test performance is not FDA approved in patients less than 39 years old. Performed at [facility], [address] [place], [place] [address]          Radiology Studies: No results found.      Scheduled Meds:  amLODipine  5 mg Oral Daily   arformoterol  15 mcg Nebulization BID   aspirin EC  81 mg Oral Daily   atorvastatin  10 mg Oral Daily   enoxaparin (LOVENOX) injection  40 mg Subcutaneous Q24H   furosemide  20 mg Oral Daily   iron polysaccharides  150 mg Oral Daily   levofloxacin  750 mg Oral Daily   metoprolol tartrate  25 mg Oral BID   multivitamin with minerals  1 tablet Oral Daily   nicotine  14 mg Transdermal Daily   pantoprazole  40 mg Oral Daily   predniSONE  20 mg Oral Q breakfast   risperiDONE  0.25 mg Oral QHS   sodium chloride flush  3 mL Intravenous Q12H   thiamine  100 mg Oral Daily   traZODone  150 mg Oral QHS   Continuous Infusions:     LOS: 4 days     Tresa Moore, MD [facility]   If 7PM-7AM, please contact night-coverage  10/06/2023, 12:29 PM

## 2023-10-06 NOTE — Plan of Care (Signed)
Problem: Education: Goal: Knowledge of disease or condition will improve 10/06/2023 1525 by Jacki Cones, RN Outcome: Progressing 10/06/2023 1525 by Jacki Cones, RN Outcome: Adequate for Discharge Goal: Knowledge of the prescribed therapeutic regimen will improve 10/06/2023 1525 by Jacki Cones, RN Outcome: Progressing 10/06/2023 1525 by Jacki Cones, RN Outcome: Adequate for Discharge Goal: Individualized Educational Video(s) 10/06/2023 1525 by Jacki Cones, RN Outcome: Progressing 10/06/2023 1525 by Jacki Cones, RN Outcome: Adequate for Discharge   Problem: Activity: Goal: Ability to tolerate increased activity will improve 10/06/2023 1525 by Jacki Cones, RN Outcome: Progressing 10/06/2023 1525 by Jacki Cones, RN Outcome: Adequate for Discharge Goal: Will verbalize the importance of balancing activity with adequate rest periods 10/06/2023 1525 by Jacki Cones, RN Outcome: Progressing 10/06/2023 1525 by Jacki Cones, RN Outcome: Adequate for Discharge   Problem: Respiratory: Goal: Ability to maintain a clear airway will improve 10/06/2023 1525 by Jacki Cones, RN Outcome: Progressing 10/06/2023 1525 by Jacki Cones, RN Outcome: Adequate for Discharge Goal: Levels of oxygenation will improve 10/06/2023 1525 by Jacki Cones, RN Outcome: Progressing 10/06/2023 1525 by Jacki Cones, RN Outcome: Adequate for Discharge Goal: Ability to maintain adequate ventilation will improve 10/06/2023 1525 by Jacki Cones, RN Outcome: Progressing 10/06/2023 1525 by Jacki Cones, RN Outcome: Adequate for Discharge   Problem: Education: Goal: Knowledge of General Education information will improve Description: Including pain rating scale, medication(s)/side effects and non-pharmacologic comfort measures 10/06/2023 1525 by Jacki Cones, RN Outcome:  Progressing 10/06/2023 1525 by Jacki Cones, RN Outcome: Adequate for Discharge   Problem: Health Behavior/Discharge Planning: Goal: Ability to manage health-related needs will improve 10/06/2023 1525 by Jacki Cones, RN Outcome: Progressing 10/06/2023 1525 by Jacki Cones, RN Outcome: Adequate for Discharge   Problem: Clinical Measurements: Goal: Ability to maintain clinical measurements within normal limits will improve 10/06/2023 1525 by Jacki Cones, RN Outcome: Progressing 10/06/2023 1525 by Jacki Cones, RN Outcome: Adequate for Discharge Goal: Will remain free from infection 10/06/2023 1525 by Jacki Cones, RN Outcome: Progressing 10/06/2023 1525 by Jacki Cones, RN Outcome: Adequate for Discharge Goal: Diagnostic test results will improve 10/06/2023 1525 by Jacki Cones, RN Outcome: Progressing 10/06/2023 1525 by Jacki Cones, RN Outcome: Adequate for Discharge Goal: Respiratory complications will improve 10/06/2023 1525 by Jacki Cones, RN Outcome: Progressing 10/06/2023 1525 by Jacki Cones, RN Outcome: Adequate for Discharge Goal: Cardiovascular complication will be avoided 10/06/2023 1525 by Jacki Cones, RN Outcome: Progressing 10/06/2023 1525 by Jacki Cones, RN Outcome: Adequate for Discharge   Problem: Activity: Goal: Risk for activity intolerance will decrease 10/06/2023 1525 by Jacki Cones, RN Outcome: Progressing 10/06/2023 1525 by Jacki Cones, RN Outcome: Adequate for Discharge   Problem: Nutrition: Goal: Adequate nutrition will be maintained 10/06/2023 1525 by Jacki Cones, RN Outcome: Progressing 10/06/2023 1525 by Jacki Cones, RN Outcome: Adequate for Discharge   Problem: Coping: Goal: Level of anxiety will decrease 10/06/2023 1525 by Jacki Cones, RN Outcome: Progressing 10/06/2023 1525 by Jacki Cones,  RN Outcome: Adequate for Discharge   Problem: Elimination: Goal: Will not experience complications related to bowel motility 10/06/2023 1525 by Jacki Cones, RN Outcome: Progressing 10/06/2023 1525 by Jacki Cones, RN Outcome: Adequate for Discharge Goal: Will not experience complications related to urinary retention 10/06/2023 1525 by Jacki Cones, RN Outcome: Progressing 10/06/2023 1525 by Jacki Cones,  RN Outcome: Adequate for Discharge   Problem: Pain Management: Goal: General experience of comfort will improve 10/06/2023 1525 by Jacki Cones, RN Outcome: Progressing 10/06/2023 1525 by Jacki Cones, RN Outcome: Adequate for Discharge   Problem: Safety: Goal: Ability to remain free from injury will improve 10/06/2023 1525 by Jacki Cones, RN Outcome: Progressing 10/06/2023 1525 by Jacki Cones, RN Outcome: Adequate for Discharge   Problem: Skin Integrity: Goal: Risk for impaired skin integrity will decrease 10/06/2023 1525 by Jacki Cones, RN Outcome: Progressing 10/06/2023 1525 by Jacki Cones, RN Outcome: Adequate for Discharge

## 2023-10-06 NOTE — Progress Notes (Signed)
Physical Therapy Treatment Patient Details Name: [name] MRN: [id number] DOB: Oct 13, 1946 Today's Date: 10/06/2023   History of Present Illness Pt is a 77 y/o F admitted on 10/01/23 after presenting with c/o SOB with intermittent chest pain. Pt is being treated with acute on chronic hypoxic hypercapnic respiratory failure. PMH: CAD, hypothyroid, advanced COPD, chronic respiratory failure on 3L O2, HLD, HTN, R toe amputations    PT Comments  Pt was long sitting in bed, minimally SOB at rest. She is alert but extremely HOH. Lacks insight of her deficist with poor overall safety awareness. Once agreeable, pt was able to exit R side of bed, stand to RW, and ambulate ~ 5 ft from EOB. Pt is extremely unsteady and remains a high fall risk. Increased WOB with minimal activity. RT in room at conclusion of session. Pt was on 3 L o2 throughout. She will continue to benefit form skilled PT at DC to maximize her independence and safety with all ADLs.     If plan is discharge home, recommend the following: A little help with walking and/or transfers;A little help with bathing/dressing/bathroom;Assistance with cooking/housework;Assist for transportation;Help with stairs or ramp for entrance     Equipment Recommendations  Other (comment) (Defer to next level of care)       Precautions / Restrictions Precautions Precautions: Fall Restrictions Weight Bearing Restrictions: No     Mobility  Bed Mobility Overal bed mobility: Needs Assistance Bed Mobility: Supine to Sit  Supine to sit: Min assist  General bed mobility comments: increased time to achieve EOB sitting    Transfers Overall transfer level: Needs assistance Equipment used: Rolling walker (2 wheels) Transfers: Sit to/from Stand, Bed to chair/wheelchair/BSC Sit to Stand: Min assist Stand pivot transfers: Min assist  General transfer comment: Pt was able to stand 2 x EOB with min assist.    Ambulation/Gait Ambulation/Gait assistance:  Min assist Gait Distance (Feet): 5 Feet Assistive device: Rolling walker (2 wheels) Gait Pattern/deviations: Step-to pattern, Narrow base of support, Scissoring Gait velocity: decreased     General Gait Details: Pt remains high fall risk. Took a few steps form EOB to recliner. slightly SOB at rest that increased with minimal activity. Pt is severely deconditioned    Balance Overall balance assessment: Needs assistance Sitting-balance support: No upper extremity supported, Feet supported Sitting balance-Leahy Scale: Fair     Standing balance support: Bilateral upper extremity supported Standing balance-Leahy Scale: Poor       Cognition Arousal: Alert Behavior During Therapy: WFL for tasks assessed/performed Overall Cognitive Status: Within Functional Limits for tasks assessed      General Comments: Pt is extremely HOH. Needs encouragement to participate but eventually agreeable. poor insight of her deficits with poor overall safety awareness.               Pertinent Vitals/Pain Pain Assessment Pain Assessment: No/denies pain     PT Goals (current goals can now be found in the care plan section) Acute Rehab PT Goals Patient Stated Goal: none stated Progress towards PT goals: Not progressing toward goals - comment (severely deconditioned/ somewhat self limting)    Frequency    Min 1X/week       AM-PAC PT "6 Clicks" Mobility   Outcome Measure  Help needed turning from your back to your side while in a flat bed without using bedrails?: None Help needed moving from lying on your back to sitting on the side of a flat bed without using bedrails?: A Little Help needed  moving to and from a bed to a chair (including a wheelchair)?: A Little Help needed standing up from a chair using your arms (e.g., wheelchair or bedside chair)?: A Little Help needed to walk in hospital room?: A Lot Help needed climbing 3-5 steps with a railing? : A Lot 6 Click Score: 17    End of  Session Equipment Utilized During Treatment: Oxygen Activity Tolerance: Patient limited by fatigue Patient left: in chair;with call bell/phone within reach;with chair alarm set Nurse Communication: Mobility status PT Visit Diagnosis: Unsteadiness on feet (R26.81);History of falling (Z91.81);Other abnormalities of gait and mobility (R26.89);Muscle weakness (generalized) (M62.81);Difficulty in walking, not elsewhere classified (R26.2)     Time: 4034-7425 PT Time Calculation (min) (ACUTE ONLY): 18 min  Charges:    $Therapeutic Activity: 8-22 mins PT General Charges $$ ACUTE PT VISIT: 1 Visit                     Jetta Lout PTA 10/06/23, 8:14 AM

## 2023-10-07 DIAGNOSIS — R0602 Shortness of breath: Secondary | ICD-10-CM | POA: Diagnosis not present

## 2023-10-07 LAB — BASIC METABOLIC PANEL
Anion gap: 12 (ref 5–15)
BUN: 21 mg/dL (ref 8–23)
CO2: 36 mmol/L — ABNORMAL HIGH (ref 22–32)
Calcium: 8.7 mg/dL — ABNORMAL LOW (ref 8.9–10.3)
Chloride: 91 mmol/L — ABNORMAL LOW (ref 98–111)
Creatinine, Ser: 0.73 mg/dL (ref 0.44–1.00)
GFR, Estimated: >60 mL/min (ref >60)
Glucose, Bld: 81 mg/dL (ref 70–99)
Potassium: 3.5 mmol/L (ref 3.5–5.1)
Sodium: 139 mmol/L (ref 135–145)

## 2023-10-07 NOTE — Plan of Care (Signed)
  Problem: Clinical Measurements: Goal: Ability to maintain clinical measurements within normal limits will improve Outcome: Progressing Goal: Will remain free from infection Outcome: Progressing Goal: Diagnostic test results will improve Outcome: Progressing Goal: Respiratory complications will improve Outcome: Progressing Goal: Cardiovascular complication will be avoided Outcome: Progressing   Problem: Pain Management: Goal: General experience of comfort will improve Outcome: Progressing   Problem: Safety: Goal: Ability to remain free from injury will improve Outcome: Progressing

## 2023-10-07 NOTE — Plan of Care (Signed)
  Problem: Education: Goal: Knowledge of disease or condition will improve Outcome: Progressing Goal: Knowledge of the prescribed therapeutic regimen will improve Outcome: Progressing Goal: Individualized Educational Video(s) Outcome: Progressing   Problem: Activity: Goal: Ability to tolerate increased activity will improve Outcome: Progressing Goal: Will verbalize the importance of balancing activity with adequate rest periods Outcome: Progressing   Problem: Respiratory: Goal: Ability to maintain a clear airway will improve Outcome: Progressing Goal: Levels of oxygenation will improve Outcome: Progressing Goal: Ability to maintain adequate ventilation will improve Outcome: Progressing   Problem: Education: Goal: Knowledge of General Education information will improve Description: Including pain rating scale, medication(s)/side effects and non-pharmacologic comfort measures Outcome: Progressing   Problem: Health Behavior/Discharge Planning: Goal: Ability to manage health-related needs will improve Outcome: Progressing   Problem: Clinical Measurements: Goal: Ability to maintain clinical measurements within normal limits will improve Outcome: Progressing Goal: Will remain free from infection Outcome: Progressing Goal: Diagnostic test results will improve Outcome: Progressing Goal: Respiratory complications will improve Outcome: Progressing Goal: Cardiovascular complication will be avoided Outcome: Progressing   Problem: Activity: Goal: Risk for activity intolerance will decrease Outcome: Progressing   Problem: Nutrition: Goal: Adequate nutrition will be maintained Outcome: Progressing   Problem: Coping: Goal: Level of anxiety will decrease Outcome: Progressing   Problem: Elimination: Goal: Will not experience complications related to bowel motility Outcome: Progressing Goal: Will not experience complications related to urinary retention Outcome: Progressing    Problem: Pain Management: Goal: General experience of comfort will improve Outcome: Progressing   Problem: Safety: Goal: Ability to remain free from injury will improve Outcome: Progressing   Problem: Skin Integrity: Goal: Risk for impaired skin integrity will decrease Outcome: Progressing

## 2023-10-07 NOTE — Progress Notes (Signed)
PROGRESS NOTE    Maneka Baltzell [facility]  WUJ:[id number] DOB: [birth year] DOA: 10/01/2023 PCP: [name], MD    Brief Narrative:   [name] is a 77 y.o. female with medical history significant for CAD, hypothyroid, advanced COPD seen by pulmonary [name], chronic respiratory failure with 3 L of oxygen nasal cannula at home, hyperlipidemia, hypertension, who presents to the emergency department for sob that started acutely today with hypoxia/ hypercapnia on bipap, with reports of intermittent chest pain currently with elevated troponin of 97 and EKG showing regular fast rhythm which is most likely atrial flutter repeat EKG is pending, CTA chest is ordered as patient is high risk for VTE with her history of reduction internal fixation and displaced intertrochanteric right hip fracture, also history of PAD followed by vascular Dr. [name] and history of amputations of the second, third and fourth MTP, chest xray is negative  with white count of  12.3. Admission requested for suspected COPD exacerbation. In emergency room vitals trend shows patient meeting SIRS criteria infection is likely but not suspected.   Assessment & Plan:   Principal Problem:   SOB (shortness of breath) Active Problems:   Chronic pain syndrome   COPD exacerbation (HCC)   Acute on chronic respiratory failure with hypoxia and hypercapnia (HCC)   CAD (coronary artery disease)   Hypertension   Underweight   Hypothyroidism   Protein-calorie malnutrition, severe (HCC)   Opiate use   PAD (peripheral artery disease) (HCC)   COPD with acute exacerbation (HCC)   Hyperthyroidism  # Acute on chronic hypoxic hypercapnic respiratory failure With elevated co2 and encephalopathy overnight, required bipap, now weaned back to baseline 3 liters and no tachypnea Plan: Continue COPD treatment as below   # COPD, severe, with acute exacerbation No PE or focal infiltrate on CXR. Pseudomonas respiratory culture last year.   Plan: Continue as needed and scheduled bronchodilators Prednisone 20 mg daily x 3 days followed by 10 mg daily for 3 days Empiric antibiotic, completed course  # PAD # Prior right toe amputations Has ulcer on dorsum of right first toe -Patient refused ABI, will recommend outpatient follow-up   # Foley catheter Per nursing placed last night per MD order but reason why is unclear, no report of retention -Patient voiding, periodic bladder scans   # CAD # Tropinemia Denies chest pain, trops mildly elevated and flat, no ischemic changes on EKG. Suspect demand. Had relatively benign TTE in April of this year - monitor - home aspirin, statin, metop   # HFpEF Appears euvolemic - home metop, lasix   # GAD Home xanax   # Dementia With history behavioral disturbance - delirium precautions - home trazodone, risperdal (but will lower trazodone dose from 300 to 150)   # Chronic pain - cont home percocet   # HTN Mild elevation today - resume home amlodipine, metop   # Thyroid Carries diagnosis of both hypothyroidism and hyperthyroidism. Currently on levothyroxine. TSH is low and t4 is elevated - advise holding levothyroxine and repeating TFTs outpatient in 4-6 wks   # Debility PT consult   # Severe malnutrition - RD consult -Palliative care consult      DVT prophylaxis: SQ Lovenox Code Status: Full Family Communication: None Disposition Plan: Status is: Inpatient Remains inpatient appropriate because: Severe COPD flare   Level of care: Med-Surg  Consultants:  None, pulmonary signed off  Procedures:  None  Antimicrobials:   Subjective: Seen and examined.  Lying in bed.  Coarse breath sounds but unlabored.  No distress.  Objective: Vitals:   10/06/23 2150 10/07/23 0500 10/07/23 0514 10/07/23 0901  BP: 124/78  129/62 135/72  Pulse: (!) 50  77 (!) 101  Resp: 18  20 16   Temp: 97.9 F (36.6 C)  98.2 F (36.8 C) 97.7 F (36.5 C)  TempSrc: Oral  Oral    SpO2: 92%  92% (!) 71%  Weight:  47 kg    Height:        Intake/Output Summary (Last 24 hours) at 10/07/2023 1003 Last data filed at 10/07/2023 0545 Gross per 24 hour  Intake --  Output 500 ml  Net -500 ml     Filed Weights   10/03/23 0404 10/04/23 0428 10/07/23 0500  Weight: 47.4 kg 47.1 kg 47 kg    Examination:  General exam: NAD.  Appears frail and chronically ill Respiratory system: Coarse crackles bilaterally.  Decreased air entry.  Breathing unlabored.  No wheeze.  3 L Cardiovascular system: S1-S2, RRR, no murmurs, no pedal edema Gastrointestinal system: Thin, soft, NT/ND, normal bowel sounds Central nervous system: Alert and oriented. No focal neurological deficits. Extremities: Symmetric 5 x 5 power. Skin: No rashes, lesions or ulcers Psychiatry: Judgement and insight appear normal. Mood & affect appropriate.     Data Reviewed: I have personally reviewed following labs and imaging studies  CBC: Recent Labs  Lab 10/01/23 1401 10/02/23 0438  WBC 12.3* 10.8*  HGB 12.3 10.5*  HCT 41.2 34.9*  MCV 97.4 94.1  PLT 353 287   Basic Metabolic Panel: Recent Labs  Lab 10/03/23 0433 10/04/23 0536 10/05/23 0332 10/06/23 0403 10/07/23 0509  NA 137 138 144 143 139  K 3.5 2.9* 3.9 3.7 3.5  CL 93* 91* 96* 95* 91*  CO2 37* 37* 37* 37* 36*  GLUCOSE 100* 100* 96 84 81  BUN 40* 26* 21 22 21   CREATININE 0.83 0.66 0.63 0.67 0.73  CALCIUM 8.4* 8.8* 8.9 9.1 8.7*  MG 2.1  --   --   --   --   PHOS 3.2  --   --   --   --    GFR: Estimated Creatinine Clearance: 44.4 mL/min (by C-G formula based on SCr of 0.73 mg/dL). Liver Function Tests: Recent Labs  Lab 10/02/23 0438  AST 11*  ALT 14  ALKPHOS 57  BILITOT 0.7  PROT 6.4*  ALBUMIN 3.3*   No results for input(s): "LIPASE", "AMYLASE" in the last 168 hours. No results for input(s): "AMMONIA" in the last 168 hours. Coagulation Profile: No results for input(s): "INR", "PROTIME" in the last 168 hours. Cardiac  Enzymes: No results for input(s): "CKTOTAL", "CKMB", "CKMBINDEX", "TROPONINI" in the last 168 hours. BNP (last 3 results) No results for input(s): "PROBNP" in the last 8760 hours. HbA1C: No results for input(s): "HGBA1C" in the last 72 hours. CBG: Recent Labs  Lab 10/04/23 1408 10/05/23 2116 10/06/23 0350 10/06/23 0438 10/06/23 0845  GLUCAP 130* 90 57* 101* 70   Lipid Profile: No results for input(s): "CHOL", "HDL", "LDLCALC", "TRIG", "CHOLHDL", "LDLDIRECT" in the last 72 hours. Thyroid Function Tests: No results for input(s): "TSH", "T4TOTAL", "FREET4", "T3FREE", "THYROIDAB" in the last 72 hours.  Anemia Panel: No results for input(s): "VITAMINB12", "FOLATE", "FERRITIN", "TIBC", "IRON", "RETICCTPCT" in the last 72 hours. Sepsis Labs: No results for input(s): "PROCALCITON", "LATICACIDVEN" in the last 168 hours.  Recent Results (from the past 240 hour(s))  Resp panel by RT-PCR (RSV, Flu A&B, Covid) Anterior Nasal Swab  Status: None   Collection Time: 10/01/23  2:50 PM   Specimen: Anterior Nasal Swab  Result Value Ref Range Status   SARS Coronavirus 2 by RT PCR NEGATIVE NEGATIVE Final    Comment: (NOTE) SARS-CoV-2 target nucleic acids are NOT DETECTED.  The SARS-CoV-2 RNA is generally detectable in upper respiratory specimens during the acute phase of infection. The lowest concentration of SARS-CoV-2 viral copies this assay can detect is 138 copies/mL. A negative result does not preclude SARS-Cov-2 infection and should not be used as the sole basis for treatment or other patient management decisions. A negative result may occur with  improper specimen collection/handling, submission of specimen other than nasopharyngeal swab, presence of viral mutation(s) within the areas targeted by this assay, and inadequate number of viral copies(<138 copies/mL). A negative result must be combined with clinical observations, patient history, and epidemiological information. The  expected result is Negative.  Fact Sheet for Patients:  BloggerCourse.com  Fact Sheet for Healthcare Providers:  SeriousBroker.it  This test is no t yet approved or cleared by the [place] FDA and  has been authorized for detection and/or diagnosis of SARS-CoV-2 by FDA under an Emergency Use Authorization (EUA). This EUA will remain  in effect (meaning this test can be used) for the duration of the COVID-19 declaration under Section 564(b)(1) of the Act, 21 U.S.C.section 360bbb-3(b)(1), unless the authorization is terminated  or revoked sooner.       Influenza A by PCR NEGATIVE NEGATIVE Final   Influenza B by PCR NEGATIVE NEGATIVE Final    Comment: (NOTE) The Xpert Xpress SARS-CoV-2/FLU/RSV plus assay is intended as an aid in the diagnosis of influenza from Nasopharyngeal swab specimens and should not be used as a sole basis for treatment. Nasal washings and aspirates are unacceptable for Xpert Xpress SARS-CoV-2/FLU/RSV testing.  Fact Sheet for Patients: BloggerCourse.com  Fact Sheet for Healthcare Providers: SeriousBroker.it  This test is not yet approved or cleared by the [place] FDA and has been authorized for detection and/or diagnosis of SARS-CoV-2 by FDA under an Emergency Use Authorization (EUA). This EUA will remain in effect (meaning this test can be used) for the duration of the COVID-19 declaration under Section 564(b)(1) of the Act, 21 U.S.C. section 360bbb-3(b)(1), unless the authorization is terminated or revoked.     Resp Syncytial Virus by PCR NEGATIVE NEGATIVE Final    Comment: (NOTE) Fact Sheet for Patients: BloggerCourse.com  Fact Sheet for Healthcare Providers: SeriousBroker.it  This test is not yet approved or cleared by the [place] FDA and has been authorized for detection and/or  diagnosis of SARS-CoV-2 by FDA under an Emergency Use Authorization (EUA). This EUA will remain in effect (meaning this test can be used) for the duration of the COVID-19 declaration under Section 564(b)(1) of the Act, 21 U.S.C. section 360bbb-3(b)(1), unless the authorization is terminated or revoked.  Performed at [facility], [address] [place], [place] [address]   Respiratory (~20 pathogens) panel by PCR     Status: None   Collection Time: 10/02/23  8:38 AM   Specimen: Nasopharyngeal Swab; Respiratory  Result Value Ref Range Status   Adenovirus NOT DETECTED NOT DETECTED Final   Coronavirus 229E NOT DETECTED NOT DETECTED Final    Comment: (NOTE) The Coronavirus on the Respiratory Panel, DOES NOT test for the novel  Coronavirus (2019 nCoV)    Coronavirus HKU1 NOT DETECTED NOT DETECTED Final   Coronavirus NL63 NOT DETECTED NOT DETECTED Final   Coronavirus OC43 NOT DETECTED  NOT DETECTED Final   Metapneumovirus NOT DETECTED NOT DETECTED Final   Rhinovirus / Enterovirus NOT DETECTED NOT DETECTED Final   Influenza A NOT DETECTED NOT DETECTED Final   Influenza B NOT DETECTED NOT DETECTED Final   Parainfluenza Virus 1 NOT DETECTED NOT DETECTED Final   Parainfluenza Virus 2 NOT DETECTED NOT DETECTED Final   Parainfluenza Virus 3 NOT DETECTED NOT DETECTED Final   Parainfluenza Virus 4 NOT DETECTED NOT DETECTED Final   Respiratory Syncytial Virus NOT DETECTED NOT DETECTED Final   Bordetella pertussis NOT DETECTED NOT DETECTED Final   Bordetella Parapertussis NOT DETECTED NOT DETECTED Final   Chlamydophila pneumoniae NOT DETECTED NOT DETECTED Final   Mycoplasma pneumoniae NOT DETECTED NOT DETECTED Final    Comment: Performed at [facility], [address] [place], [place] [address]  MRSA Next Gen by PCR, Nasal     Status: Abnormal   Collection Time: 10/03/23  7:19 AM   Specimen: Nasal Mucosa; Nasal Swab  Result Value Ref Range Status   MRSA by PCR Next Gen  DETECTED (A) NOT DETECTED Final    Comment: RESULT CALLED TO, READ BACK BY AND VERIFIED WITH: Blanchard Mane RN [phone] 10/03/23 HNM (NOTE) The GeneXpert MRSA Assay (FDA approved for NASAL specimens only), is one component of a comprehensive MRSA colonization surveillance program. It is not intended to diagnose MRSA infection nor to guide or monitor treatment for MRSA infections. Test performance is not FDA approved in patients less than 27 years old. Performed at [facility], [address], [place], [place] [address]          Radiology Studies: No results found.      Scheduled Meds:  amLODipine  5 mg Oral Daily   arformoterol  15 mcg Nebulization BID   aspirin EC  81 mg Oral Daily   atorvastatin  10 mg Oral Daily   enoxaparin (LOVENOX) injection  40 mg Subcutaneous Q24H   furosemide  20 mg Oral Daily   iron polysaccharides  150 mg Oral Daily   metoprolol tartrate  25 mg Oral BID   multivitamin with minerals  1 tablet Oral Daily   nicotine  14 mg Transdermal Daily   pantoprazole  40 mg Oral Daily   predniSONE  20 mg Oral Q breakfast   risperiDONE  0.25 mg Oral QHS   sodium chloride flush  3 mL Intravenous Q12H   thiamine  100 mg Oral Daily   traZODone  150 mg Oral QHS   Continuous Infusions:     LOS: 5 days     Tresa Moore, MD [facility]   If 7PM-7AM, please contact night-coverage  10/07/2023, 10:03 AM

## 2023-10-08 ENCOUNTER — Encounter: Payer: Self-pay | Admitting: Obstetrics and Gynecology

## 2023-10-08 DIAGNOSIS — R0602 Shortness of breath: Secondary | ICD-10-CM | POA: Diagnosis not present

## 2023-10-08 MED ORDER — PREDNISONE 10 MG PO TABS: ORAL_TABLET | ORAL | Status: AC

## 2023-10-08 MED ORDER — ALPRAZOLAM 1 MG PO TABS: 1.0000 mg | ORAL_TABLET | Freq: Three times a day (TID) | ORAL | 0 refills | Status: DC | PRN

## 2023-10-08 NOTE — Progress Notes (Signed)
Physical Therapy Treatment Patient Details Name: [name] MRN: [id number] DOB: July 02, 1946 Today's Date: 10/08/2023   History of Present Illness Pt is a 76 y/o F admitted on 10/01/23 after presenting with c/o SOB with intermittent chest pain. Pt is being treated with acute on chronic hypoxic hypercapnic respiratory failure. PMH: CAD, hypothyroid, advanced COPD, chronic respiratory failure on 3L O2, HLD, HTN, R toe amputations    PT Comments  Pt A&O to place, month/year, self. Reported chronic R foot pain with calf pain today but did not quantify, demonstrated mild pain signs/symptoms (especially with touch). The pt required modA for most mobility today, noted for deconditioning and pt quick to fatigue. Step pivot to recliner and extended time to maximize pt participation. spO2 on 2.5L 90% or greater though difficulty obtaining read directly after step pivot. Pt requested a breathing treatment and RN notified. The patient would benefit from further skilled PT intervention to continue to progress towards goals as able.    If plan is discharge home, recommend the following: Assistance with cooking/housework;Assist for transportation;Help with stairs or ramp for entrance;A lot of help with walking and/or transfers;A lot of help with bathing/dressing/bathroom   Can travel by private vehicle     No  Equipment Recommendations  Other (comment) (defer to next level of care)    Recommendations for Other Services       Precautions / Restrictions Precautions Precautions: Fall Restrictions Weight Bearing Restrictions: No     Mobility  Bed Mobility Overal bed mobility: Needs Assistance Bed Mobility: Supine to Sit     Supine to sit: Mod assist          Transfers Overall transfer level: Needs assistance   Transfers: Sit to/from Stand Sit to Stand: Mod assist Stand pivot transfers: Mod assist         General transfer comment: needed modA towards end of step pivot due to  fatigue, but able to take very shuffled steps, did not actually clear from floor    Ambulation/Gait                   Stairs             Wheelchair Mobility     Tilt Bed    Modified Rankin (Stroke Patients Only)       Balance Overall balance assessment: Needs assistance Sitting-balance support: Feet supported Sitting balance-Leahy Scale: Fair     Standing balance support: Bilateral upper extremity supported Standing balance-Leahy Scale: Poor                              Cognition Arousal: Alert Behavior During Therapy: WFL for tasks assessed/performed Overall Cognitive Status: Within Functional Limits for tasks assessed                                          Exercises      General Comments        Pertinent Vitals/Pain Pain Assessment Pain Assessment: Faces Faces Pain Scale: Hurts little more Pain Location: R great toe, calf to palpation Pain Descriptors / Indicators: Aching, Sore, Grimacing, Guarding Pain Intervention(s): Limited activity within patient's tolerance, Monitored during session, Repositioned    Home Living  Prior Function            PT Goals (current goals can now be found in the care plan section) Progress towards PT goals:  (limited progression to to fatigue)    Frequency    Min 1X/week      PT Plan      Co-evaluation              AM-PAC PT "6 Clicks" Mobility   Outcome Measure  Help needed turning from your back to your side while in a flat bed without using bedrails?: A Lot Help needed moving from lying on your back to sitting on the side of a flat bed without using bedrails?: A Lot Help needed moving to and from a bed to a chair (including a wheelchair)?: A Lot Help needed standing up from a chair using your arms (e.g., wheelchair or bedside chair)?: A Lot Help needed to walk in hospital room?: A Lot Help needed climbing 3-5 steps with  a railing? : Total 6 Click Score: 11    End of Session Equipment Utilized During Treatment: Oxygen Activity Tolerance: Patient limited by fatigue Patient left: in chair;with call bell/phone within reach;with chair alarm set Nurse Communication: Mobility status PT Visit Diagnosis: Unsteadiness on feet (R26.81);History of falling (Z91.81);Other abnormalities of gait and mobility (R26.89);Muscle weakness (generalized) (M62.81);Difficulty in walking, not elsewhere classified (R26.2)     Time: 5621-3086 PT Time Calculation (min) (ACUTE ONLY): 14 min  Charges:    $Therapeutic Activity: 8-22 mins PT General Charges $$ ACUTE PT VISIT: 1 Visit                     Olga Coaster PT, DPT 10:15 AM,10/08/23

## 2023-10-08 NOTE — TOC Transition Note (Signed)
Transition of Care [facility]) - CM/SW Discharge Note   Patient Details  Name: [name] MRN: [id number] Date of Birth: 01-16-1946  [facility]) CM/SW Contact:  Chapman Fitch, RN Phone Number: 10/08/2023, 10:25 AM   Clinical Narrative:     Patient will DC to: [facility] Anticipated DC date: 10/08/23  Family notified:Grandson Roland Earl Transport by: Wendie Simmer  Per MD patient ready for DC to . RN, patient, patient's family, and facility notified of DC. Discharge Summary sent to facility. RN given number for report. DC packet on chart. Ambulance transport requested for patient.  TOC signing off.          Patient Goals and CMS Choice      Discharge Placement                         Discharge Plan and Services Additional resources added to the After Visit Summary for                                       Social Determinants of Health (SDOH) Interventions SDOH Screenings   Food Insecurity: No Food Insecurity (10/02/2023)  Housing: Low Risk  (10/02/2023)  Transportation Needs: No Transportation Needs (10/02/2023)  Utilities: Not At Risk (10/02/2023)  Depression (PHQ2-9): Low Risk  (07/13/2021)  Tobacco Use: High Risk (10/08/2023)     Readmission Risk Interventions    10/03/2023   10:51 AM 01/31/2023   11:49 AM 07/05/2022   10:50 AM  Readmission Risk Prevention Plan  Transportation Screening Complete Complete Complete  Medication Review Oceanographer) Complete Complete Complete  PCP or Specialist appointment within 3-5 days of discharge  Complete   HRI or Home Care Consult   Complete  SW Recovery Care/Counseling Consult Complete Complete Complete  Palliative Care Screening Not Applicable Not Applicable   Skilled Nursing Facility Complete Not Applicable Not Applicable

## 2023-10-08 NOTE — Discharge Summary (Signed)
Physician Discharge Summary  [name] GEX:[id number] DOB: 1946/04/03 DOA: 10/01/2023  PCP: [name], MD  Admit date: 10/01/2023 Discharge date: 10/08/2023  Admitted From: Home Disposition:  SNF  Recommendations for Outpatient Follow-up:  Follow up with PCP in 1-2 weeks   Home Health:No Equipment/Devices:Oxygen 3L via [place]   Discharge Condition:Stable   CODE STATUS:FULL  Diet recommendation: Reg  Brief/Interim Summary:  [name] is a 77 y.o. female with medical history significant for CAD, hypothyroid, advanced COPD seen by pulmonary [name], chronic respiratory failure with 3 L of oxygen nasal cannula at home, hyperlipidemia, hypertension, who presents to the emergency department for sob that started acutely today with hypoxia/ hypercapnia on bipap, with reports of intermittent chest pain currently with elevated troponin of 97 and EKG showing regular fast rhythm which is most likely atrial flutter repeat EKG is pending, CTA chest is ordered as patient is high risk for VTE with her history of reduction internal fixation and displaced intertrochanteric right hip fracture, also history of PAD followed by vascular Dr. [name] and history of amputations of the second, third and fourth MTP, chest xray is negative  with white count of  12.3. Admission requested for suspected COPD exacerbation. In emergency room vitals trend shows patient meeting SIRS criteria infection is likely but not suspected.    Discharge Diagnoses:  Principal Problem:   SOB (shortness of breath) Active Problems:   Chronic pain syndrome   COPD exacerbation (HCC)   Acute on chronic respiratory failure with hypoxia and hypercapnia (HCC)   CAD (coronary artery disease)   Hypertension   Underweight   Hypothyroidism   Protein-calorie malnutrition, severe (HCC)   Opiate use   PAD (peripheral artery disease) (HCC)   COPD with acute exacerbation (HCC)   Hyperthyroidism # Acute on chronic hypoxic  hypercapnic respiratory failure With elevated co2 and encephalopathy overnight, required bipap, now weaned back to baseline 3 liters and no tachypnea Plan: COPD treatment as below Back on baseline o2 at time of dc   # COPD, severe, with acute exacerbation No PE or focal infiltrate on CXR. Pseudomonas respiratory culture last year.  Plan: Resume home bronchodilator regimen Extended prednisone taper prescribed given severity of COPD   # PAD # Prior right toe amputations Has ulcer on dorsum of right first toe -Patient refused ABI, will recommend outpatient follow-up   Discharge Instructions  Discharge Instructions     Amb Referral to Palliative Care   Complete by: As directed    Diet - low sodium heart healthy   Complete by: As directed    Increase activity slowly   Complete by: As directed       Allergies as of 10/08/2023   No Known Allergies      Medication List     STOP taking these medications    oxyCODONE-acetaminophen 10-325 MG tablet Commonly known as: PERCOCET       TAKE these medications    albuterol 108 (90 Base) MCG/ACT inhaler Commonly known as: VENTOLIN HFA Inhale 1-2 puffs into the lungs every 4 (four) hours as needed for shortness of breath or wheezing.   ALPRAZolam 1 MG tablet Commonly known as: XANAX Take 1 tablet (1 mg total) by mouth 3 (three) times daily as needed. SNF use only What changed: additional instructions   amLODipine 5 MG tablet Commonly known as: NORVASC Take 1 tablet (5 mg total) by mouth daily.   ammonium lactate 12 % lotion Commonly known as: LAC-HYDRIN APPLY TO AFFECTED  AREA AS NEEDED FOR DRY SKIN   Aspirin Low Dose 81 MG tablet Generic drug: aspirin EC TAKE 1 TABLET BY MOUTH EVERY DAY   atorvastatin 10 MG tablet Commonly known as: Lipitor Take 1 tablet (10 mg total) by mouth daily.   benzonatate 100 MG capsule Commonly known as: TESSALON Take 1 capsule (100 mg total) by mouth 3 (three) times daily as needed  for cough.   feeding supplement Liqd Take 237 mLs by mouth 3 (three) times daily between meals.   fluticasone-salmeterol 250-50 MCG/ACT Aepb Commonly known as: ADVAIR Inhale 1 puff into the lungs 2 (two) times daily as needed.   furosemide 20 MG tablet Commonly known as: LASIX Take 20 mg by mouth daily.   hydrOXYzine 25 MG tablet Commonly known as: ATARAX Take 1 tablet (25 mg total) by mouth 3 (three) times daily as needed for anxiety.   ipratropium-albuterol 0.5-2.5 (3) MG/3ML Soln Commonly known as: DUONEB Take 3 mLs by nebulization every 6 (six) hours as needed.   Combivent Respimat 20-100 MCG/ACT Aers respimat Generic drug: Ipratropium-Albuterol Inhale 1-2 puffs into the lungs in the morning, at noon, in the evening, and at bedtime.   iron polysaccharides 150 MG capsule Commonly known as: NIFEREX Take 1 capsule (150 mg total) by mouth daily.   levothyroxine 50 MCG tablet Commonly known as: SYNTHROID Take 50 mcg by mouth every morning.   melatonin 5 MG Tabs Take 1 tablet (5 mg total) by mouth at bedtime.   metoprolol tartrate 25 MG tablet Commonly known as: LOPRESSOR Take 1 tablet (25 mg total) by mouth 2 (two) times daily. Skip the dose if systolic BP less than 130 mmHg and/or heart rate less than 65 beats per minute   multivitamin with minerals Tabs tablet Take 1 tablet by mouth daily.   omeprazole 40 MG capsule Commonly known as: PRILOSEC Take 40 mg by mouth daily.   polyethylene glycol 17 g packet Commonly known as: MIRALAX / GLYCOLAX Take 17 g by mouth daily as needed.   potassium chloride SA 20 MEQ tablet Commonly known as: KLOR-CON M Take 20 mEq by mouth daily.   predniSONE 10 MG tablet Commonly known as: DELTASONE Take 2 tablets (20 mg total) by mouth daily for 5 days, THEN 1 tablet (10 mg total) daily for 5 days. Start taking on: October 08, 2023   risperiDONE 0.25 MG tablet Commonly known as: RISPERDAL Take 1 tablet (0.25 mg total) by mouth  at bedtime.   thiamine 100 MG tablet Commonly known as: Vitamin B-1 Take 1 tablet (100 mg total) by mouth daily.   traZODone 150 MG tablet Commonly known as: DESYREL Take 300 mg by mouth at bedtime.        Contact information for after-discharge care     Destination     [facility] Preferred SNF .   Service: Skilled Nursing Contact information: [address] [place] [address] [phone]                    No Known Allergies  Consultations: None   Procedures/Studies: DG Toe Great Right  Result Date: 10/02/2023 CLINICAL DATA:  Pain.  Great toe EXAM: RIGHT GREAT TOE three views COMPARISON:  Right foot x-ray 06/29/2022. FINDINGS: Osteopenia. Of the great toe there is no fracture or dislocation. There is no true lateral film obtained. Soft tissue swelling identified. There has been amputation of the phalanges of the second,  third and fourth digits adjacent. Minimal soft tissue calcification of the amputation site. IMPRESSION: Osteopenia.  Soft tissue swelling. Electronically Signed   By: Karen Kays M.D.   On: 10/02/2023 12:50   CT Head Wo Contrast  Result Date: 10/02/2023 CLINICAL DATA:  Altered mental status. EXAM: CT HEAD WITHOUT CONTRAST TECHNIQUE: Contiguous axial images were obtained from the base of the skull through the vertex without intravenous contrast. RADIATION DOSE REDUCTION: This exam was performed according to the departmental dose-optimization program which includes automated exposure control, adjustment of the mA and/or kV according to patient size and/or use of iterative reconstruction technique. COMPARISON:  February 24, 2023 FINDINGS: Brain: There is mild cerebral atrophy with widening of the extra-axial spaces and stable ventricular dilatation. There are areas of decreased attenuation within the white matter tracts of the supratentorial brain, consistent  with microvascular disease changes. Vascular: Marked severity bilateral cavernous carotid artery calcification is noted. Skull: Normal. Negative for fracture or focal lesion. Sinuses/Orbits: No acute finding. Other: None. IMPRESSION: 1. Generalized cerebral atrophy with chronic white matter small vessel ischemic changes. 2. No acute intracranial abnormality. Electronically Signed   By: Aram Candela M.D.   On: 10/02/2023 03:45   CT Angio Chest PE W and/or Wo Contrast  Result Date: 10/01/2023 CLINICAL DATA:  Shortness of breath with hypoxia. COPD exacerbation. Clinical concern for pulmonary embolism. EXAM: CT ANGIOGRAPHY CHEST WITH CONTRAST TECHNIQUE: Multidetector CT imaging of the chest was performed using the standard protocol during bolus administration of intravenous contrast. Multiplanar CT image reconstructions and MIPs were obtained to evaluate the vascular anatomy. RADIATION DOSE REDUCTION: This exam was performed according to the departmental dose-optimization program which includes automated exposure control, adjustment of the mA and/or kV according to patient size and/or use of iterative reconstruction technique. CONTRAST:  75mL OMNIPAQUE IOHEXOL 350 MG/ML SOLN COMPARISON:  Radiographs 10/01/2023 and 07/05/2023. Chest CTA 01/30/2023 and 01/13/2021. FINDINGS: Cardiovascular: The pulmonary arteries are well opacified with contrast to the level of the segmental branches. There is no evidence of acute pulmonary embolism. Atherosclerosis of the aorta, great vessels and coronary arteries without evidence of acute systemic arterial abnormality. The heart size is normal. There is no pericardial effusion. Mediastinum/Nodes: Unchanged mild mediastinal and right hilar adenopathy. Unchanged diffuse esophageal wall thickening. The thyroid gland and trachea appear unremarkable. Lungs/Pleura: No pleural effusion or pneumothorax. Moderate to severe centrilobular emphysema with diffuse central airway thickening.  Interval improved aeration of the right lower lobe. No residual focal airspace disease to suggest pneumonia. Upper abdomen: The visualized upper abdomen appears unchanged. There is chronic extrahepatic biliary dilatation status post cholecystectomy. Stable chronic nodular thickening of the adrenal glands and bilateral renal cysts, for which no specific follow-up imaging is recommended. Musculoskeletal/Chest wall: There is no chest wall mass or suspicious osseous finding. Multiple thoracic compression deformities are unchanged from the most recent prior study. Previous cervicothoracic fusion. Unless specific follow-up recommendations are mentioned in the findings or impression sections, no imaging follow-up of any mentioned incidental findings is recommended. Review of the MIP images confirms the above findings. IMPRESSION: 1. No evidence of acute pulmonary embolism or other acute thoracic findings. 2. Interval improved aeration of the right lower lobe. No residual focal airspace disease to suggest pneumonia. 3. Unchanged mild mediastinal and right hilar adenopathy, likely reactive. 4. Unchanged diffuse esophageal wall thickening, likely esophagitis. 5. Aortic Atherosclerosis (ICD10-I70.0) and Emphysema (ICD10-J43.9). Electronically Signed   By: Carey Bullocks M.D.   On: 10/01/2023 21:27   DG Chest [facility] [address]  Result Date: 10/01/2023 CLINICAL DATA:  Shortness of breath EXAM: PORTABLE CHEST 1 VIEW COMPARISON:  Chest x-ray 07/05/2023 FINDINGS: The heart size and mediastinal contours are within normal limits. Both lungs are clear. No acute osseous abnormality. Cervical spinal fusion plate is present. IMPRESSION: No active disease. Electronically Signed   By: Darliss Cheney M.D.   On: 10/01/2023 17:49      Subjective: Seen and examined on day of dc.  Stable, back at baseline  Discharge Exam: Vitals:   10/08/23 0205 10/08/23 0914  BP: 129/60 113/66  Pulse: 82 95  Resp: 16 20  Temp: 97.8 F (36.6 C)  97.8 F (36.6 C)  SpO2: 99% 100%   Vitals:   10/07/23 1935 10/08/23 0205 10/08/23 0500 10/08/23 0914  BP:  129/60  113/66  Pulse:  82  95  Resp:  16  20  Temp:  97.8 F (36.6 C)  97.8 F (36.6 C)  TempSrc:    Oral  SpO2: 98% 99%  100%  Weight:   48.2 kg   Height:        General: Pt is alert, awake, not in acute distress Cardiovascular: RRR, S1/S2 +, no rubs, no gallops Respiratory: Coarse breath sounds, normal respiratory effort.  3L Abdominal: Soft, NT, ND, bowel sounds + Extremities: no edema, no cyanosis    The results of significant diagnostics from this hospitalization (including imaging, microbiology, ancillary and laboratory) are listed below for reference.     Microbiology: Recent Results (from the past 240 hour(s))  Resp panel by RT-PCR (RSV, Flu A&B, Covid) Anterior Nasal Swab     Status: None   Collection Time: 10/01/23  2:50 PM   Specimen: Anterior Nasal Swab  Result Value Ref Range Status   SARS Coronavirus 2 by RT PCR NEGATIVE NEGATIVE Final    Comment: (NOTE) SARS-CoV-2 target nucleic acids are NOT DETECTED.  The SARS-CoV-2 RNA is generally detectable in upper respiratory specimens during the acute phase of infection. The lowest concentration of SARS-CoV-2 viral copies this assay can detect is 138 copies/mL. A negative result does not preclude SARS-Cov-2 infection and should not be used as the sole basis for treatment or other patient management decisions. A negative result may occur with  improper specimen collection/handling, submission of specimen other than nasopharyngeal swab, presence of viral mutation(s) within the areas targeted by this assay, and inadequate number of viral copies(<138 copies/mL). A negative result must be combined with clinical observations, patient history, and epidemiological information. The expected result is Negative.  Fact Sheet for Patients:  BloggerCourse.com  Fact Sheet for Healthcare  Providers:  SeriousBroker.it  This test is no t yet approved or cleared by the [place] FDA and  has been authorized for detection and/or diagnosis of SARS-CoV-2 by FDA under an Emergency Use Authorization (EUA). This EUA will remain  in effect (meaning this test can be used) for the duration of the COVID-19 declaration under Section 564(b)(1) of the Act, 21 U.S.C.section 360bbb-3(b)(1), unless the authorization is terminated  or revoked sooner.       Influenza A by PCR NEGATIVE NEGATIVE Final   Influenza B by PCR NEGATIVE NEGATIVE Final    Comment: (NOTE) The Xpert Xpress SARS-CoV-2/FLU/RSV plus assay is intended as an aid in the diagnosis of influenza from Nasopharyngeal swab specimens and should not be used as a sole basis for treatment. Nasal washings and aspirates are unacceptable for Xpert Xpress SARS-CoV-2/FLU/RSV testing.  Fact Sheet for Patients: BloggerCourse.com  Fact Sheet for Healthcare Providers: SeriousBroker.it  This test is not yet approved or cleared by the [place] and has been authorized for detection and/or diagnosis of SARS-CoV-2 by FDA under an Emergency Use Authorization (EUA). This EUA will remain in effect (meaning this test can be used) for the duration of the COVID-19 declaration under Section 564(b)(1) of the Act, 21 U.S.C. section 360bbb-3(b)(1), unless the authorization is terminated or revoked.     Resp Syncytial Virus by PCR NEGATIVE NEGATIVE Final    Comment: (NOTE) Fact Sheet for Patients: BloggerCourse.com  Fact Sheet for Healthcare Providers: SeriousBroker.it  This test is not yet approved or cleared by the [place] FDA and has been authorized for detection and/or diagnosis of SARS-CoV-2 by FDA under an Emergency Use Authorization (EUA). This EUA will remain in effect (meaning this test can be  used) for the duration of the COVID-19 declaration under Section 564(b)(1) of the Act, 21 U.S.C. section 360bbb-3(b)(1), unless the authorization is terminated or revoked.  Performed at [facility], [address] [place], [place] [address]   Respiratory (~20 pathogens) panel by PCR     Status: None   Collection Time: 10/02/23  8:38 AM   Specimen: Nasopharyngeal Swab; Respiratory  Result Value Ref Range Status   Adenovirus NOT DETECTED NOT DETECTED Final   Coronavirus 229E NOT DETECTED NOT DETECTED Final    Comment: (NOTE) The Coronavirus on the Respiratory Panel, DOES NOT test for the novel  Coronavirus (2019 nCoV)    Coronavirus HKU1 NOT DETECTED NOT DETECTED Final   Coronavirus NL63 NOT DETECTED NOT DETECTED Final   Coronavirus OC43 NOT DETECTED NOT DETECTED Final   Metapneumovirus NOT DETECTED NOT DETECTED Final   Rhinovirus / Enterovirus NOT DETECTED NOT DETECTED Final   Influenza A NOT DETECTED NOT DETECTED Final   Influenza B NOT DETECTED NOT DETECTED Final   Parainfluenza Virus 1 NOT DETECTED NOT DETECTED Final   Parainfluenza Virus 2 NOT DETECTED NOT DETECTED Final   Parainfluenza Virus 3 NOT DETECTED NOT DETECTED Final   Parainfluenza Virus 4 NOT DETECTED NOT DETECTED Final   Respiratory Syncytial Virus NOT DETECTED NOT DETECTED Final   Bordetella pertussis NOT DETECTED NOT DETECTED Final   Bordetella Parapertussis NOT DETECTED NOT DETECTED Final   Chlamydophila pneumoniae NOT DETECTED NOT DETECTED Final   Mycoplasma pneumoniae NOT DETECTED NOT DETECTED Final    Comment: Performed at [facility], [address], [place], [place] [address]  MRSA Next Gen by PCR, Nasal     Status: Abnormal   Collection Time: 10/03/23  7:19 AM   Specimen: Nasal Mucosa; Nasal Swab  Result Value Ref Range Status   MRSA by PCR Next Gen DETECTED (A) NOT DETECTED Final    Comment: RESULT CALLED TO, READ BACK BY AND VERIFIED WITH: Blanchard Mane RN [phone] 10/03/23  HNM (NOTE) The GeneXpert MRSA Assay (FDA approved for NASAL specimens only), is one component of a comprehensive MRSA colonization surveillance program. It is not intended to diagnose MRSA infection nor to guide or monitor treatment for MRSA infections. Test performance is not FDA approved in patients less than 37 years old. Performed at [facility], [address] [place], [place] [address]      Labs: BNP (last 3 results) Recent Labs    01/10/23 2314 01/30/23 0740 02/24/23 1106  BNP 70.6 415.9* 51.2   Basic Metabolic Panel: Recent Labs  Lab 10/03/23 0433 10/04/23 0536 10/05/23 0332 10/06/23 0403 10/07/23 0509  NA 137 138 144 143 139  K  3.5 2.9* 3.9 3.7 3.5  CL 93* 91* 96* 95* 91*  CO2 37* 37* 37* 37* 36*  GLUCOSE 100* 100* 96 84 81  BUN 40* 26* 21 22 21   CREATININE 0.83 0.66 0.63 0.67 0.73  CALCIUM 8.4* 8.8* 8.9 9.1 8.7*  MG 2.1  --   --   --   --   PHOS 3.2  --   --   --   --    Liver Function Tests: Recent Labs  Lab 10/02/23 0438  AST 11*  ALT 14  ALKPHOS 57  BILITOT 0.7  PROT 6.4*  ALBUMIN 3.3*   No results for input(s): "LIPASE", "AMYLASE" in the last 168 hours. No results for input(s): "AMMONIA" in the last 168 hours. CBC: Recent Labs  Lab 10/01/23 1401 10/02/23 0438  WBC 12.3* 10.8*  HGB 12.3 10.5*  HCT 41.2 34.9*  MCV 97.4 94.1  PLT 353 287   Cardiac Enzymes: No results for input(s): "CKTOTAL", "CKMB", "CKMBINDEX", "TROPONINI" in the last 168 hours. BNP: Invalid input(s): "POCBNP" CBG: Recent Labs  Lab 10/04/23 1408 10/05/23 2116 10/06/23 0350 10/06/23 0438 10/06/23 0845  GLUCAP 130* 90 57* 101* 70   D-Dimer No results for input(s): "DDIMER" in the last 72 hours. Hgb A1c No results for input(s): "HGBA1C" in the last 72 hours. Lipid Profile No results for input(s): "CHOL", "HDL", "LDLCALC", "TRIG", "CHOLHDL", "LDLDIRECT" in the last 72 hours. Thyroid function studies No results for input(s): "TSH", "T4TOTAL",  "T3FREE", "THYROIDAB" in the last 72 hours.  Invalid input(s): "FREET3" Anemia work up No results for input(s): "VITAMINB12", "FOLATE", "FERRITIN", "TIBC", "IRON", "RETICCTPCT" in the last 72 hours. Urinalysis    Component Value Date/Time   COLORURINE YELLOW (A) 07/05/2023 1141   APPEARANCEUR CLEAR (A) 07/05/2023 1141   LABSPEC 1.021 07/05/2023 1141   PHURINE 5.0 07/05/2023 1141   GLUCOSEU NEGATIVE 07/05/2023 1141   HGBUR NEGATIVE 07/05/2023 1141   BILIRUBINUR NEGATIVE 07/05/2023 1141   KETONESUR NEGATIVE 07/05/2023 1141   PROTEINUR NEGATIVE 07/05/2023 1141   NITRITE NEGATIVE 07/05/2023 1141   LEUKOCYTESUR NEGATIVE 07/05/2023 1141   Sepsis Labs Recent Labs  Lab 10/01/23 1401 10/02/23 0438  WBC 12.3* 10.8*   Microbiology Recent Results (from the past 240 hour(s))  Resp panel by RT-PCR (RSV, Flu A&B, Covid) Anterior Nasal Swab     Status: None   Collection Time: 10/01/23  2:50 PM   Specimen: Anterior Nasal Swab  Result Value Ref Range Status   SARS Coronavirus 2 by RT PCR NEGATIVE NEGATIVE Final    Comment: (NOTE) SARS-CoV-2 target nucleic acids are NOT DETECTED.  The SARS-CoV-2 RNA is generally detectable in upper respiratory specimens during the acute phase of infection. The lowest concentration of SARS-CoV-2 viral copies this assay can detect is 138 copies/mL. A negative result does not preclude SARS-Cov-2 infection and should not be used as the sole basis for treatment or other patient management decisions. A negative result may occur with  improper specimen collection/handling, submission of specimen other than nasopharyngeal swab, presence of viral mutation(s) within the areas targeted by this assay, and inadequate number of viral copies(<138 copies/mL). A negative result must be combined with clinical observations, patient history, and epidemiological information. The expected result is Negative.  Fact Sheet for Patients:   BloggerCourse.com  Fact Sheet for Healthcare Providers:  SeriousBroker.it  This test is no t yet approved or cleared by the [place] FDA and  has been authorized for detection and/or diagnosis of SARS-CoV-2 by FDA under an Emergency  Use Authorization (EUA). This EUA will remain  in effect (meaning this test can be used) for the duration of the COVID-19 declaration under Section 564(b)(1) of the Act, 21 U.S.C.section 360bbb-3(b)(1), unless the authorization is terminated  or revoked sooner.       Influenza A by PCR NEGATIVE NEGATIVE Final   Influenza B by PCR NEGATIVE NEGATIVE Final    Comment: (NOTE) The Xpert Xpress SARS-CoV-2/FLU/RSV plus assay is intended as an aid in the diagnosis of influenza from Nasopharyngeal swab specimens and should not be used as a sole basis for treatment. Nasal washings and aspirates are unacceptable for Xpert Xpress SARS-CoV-2/FLU/RSV testing.  Fact Sheet for Patients: BloggerCourse.com  Fact Sheet for Healthcare Providers: SeriousBroker.it  This test is not yet approved or cleared by the [place] FDA and has been authorized for detection and/or diagnosis of SARS-CoV-2 by FDA under an Emergency Use Authorization (EUA). This EUA will remain in effect (meaning this test can be used) for the duration of the COVID-19 declaration under Section 564(b)(1) of the Act, 21 U.S.C. section 360bbb-3(b)(1), unless the authorization is terminated or revoked.     Resp Syncytial Virus by PCR NEGATIVE NEGATIVE Final    Comment: (NOTE) Fact Sheet for Patients: BloggerCourse.com  Fact Sheet for Healthcare Providers: SeriousBroker.it  This test is not yet approved or cleared by the [place] FDA and has been authorized for detection and/or diagnosis of SARS-CoV-2 by FDA under an Emergency Use  Authorization (EUA). This EUA will remain in effect (meaning this test can be used) for the duration of the COVID-19 declaration under Section 564(b)(1) of the Act, 21 U.S.C. section 360bbb-3(b)(1), unless the authorization is terminated or revoked.  Performed at [facility], [address] [place], [place] [address]   Respiratory (~20 pathogens) panel by PCR     Status: None   Collection Time: 10/02/23  8:38 AM   Specimen: Nasopharyngeal Swab; Respiratory  Result Value Ref Range Status   Adenovirus NOT DETECTED NOT DETECTED Final   Coronavirus 229E NOT DETECTED NOT DETECTED Final    Comment: (NOTE) The Coronavirus on the Respiratory Panel, DOES NOT test for the novel  Coronavirus (2019 nCoV)    Coronavirus HKU1 NOT DETECTED NOT DETECTED Final   Coronavirus NL63 NOT DETECTED NOT DETECTED Final   Coronavirus OC43 NOT DETECTED NOT DETECTED Final   Metapneumovirus NOT DETECTED NOT DETECTED Final   Rhinovirus / Enterovirus NOT DETECTED NOT DETECTED Final   Influenza A NOT DETECTED NOT DETECTED Final   Influenza B NOT DETECTED NOT DETECTED Final   Parainfluenza Virus 1 NOT DETECTED NOT DETECTED Final   Parainfluenza Virus 2 NOT DETECTED NOT DETECTED Final   Parainfluenza Virus 3 NOT DETECTED NOT DETECTED Final   Parainfluenza Virus 4 NOT DETECTED NOT DETECTED Final   Respiratory Syncytial Virus NOT DETECTED NOT DETECTED Final   Bordetella pertussis NOT DETECTED NOT DETECTED Final   Bordetella Parapertussis NOT DETECTED NOT DETECTED Final   Chlamydophila pneumoniae NOT DETECTED NOT DETECTED Final   Mycoplasma pneumoniae NOT DETECTED NOT DETECTED Final    Comment: Performed at [facility], 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 It is not intended to diagnose MRSA infection nor to guide or monitor treatment for MRSA infections. Test performance is not FDA approved in patients less than 32 years old. Performed at [facility], [address] [place], [place] [address]      Time coordinating discharge: Over 30 minutes  SIGNED:   Tresa Moore, MD  [facility] 10/08/2023, 9:16 AM Pager   If 7PM-7AM, please contact night-coverage

## 2023-10-08 NOTE — Progress Notes (Signed)
Report give to [employer]. Awaiting EMS pickup.

## 2023-11-01 ENCOUNTER — Telehealth: Payer: Self-pay | Admitting: Podiatry

## 2023-11-01 NOTE — Telephone Encounter (Signed)
Noted  

## 2023-11-01 NOTE — Telephone Encounter (Signed)
Home health is calling because patient  has a wound on right big toe.  Is going to do a dry wrap.  Not sure what else to do.  Please call.

## 2023-11-01 NOTE — Telephone Encounter (Signed)
Dee (Home care) is a Engineer, civil (consulting) at [employer] on behalf of this patient miss [name]. She is calling in regards to possibly obtaining signed orders for [name] to receive wound care and to receive home health care. She did want to stress that they need to be signed and dated. She would like it if Dr.Patel could call so they know how to move forward with the patient. She's also okay if the nurse calls. Her direct number is [phone]   Thanks !

## 2023-11-02 ENCOUNTER — Telehealth: Payer: Self-pay | Admitting: Podiatry

## 2023-11-02 NOTE — Telephone Encounter (Signed)
Delice Bison from [facility] is calling on behalf of this patient because she hasn't received any signed documentation stating exactly what it is they are to do with this patient and dressing her wounds I did advise that dee was given the verbal orders and to confirm with dee but she states all she was told was to continue with wound care. No documentation or signed orders have been sent to them.  Can you please call  her at  [phone] its her personal cell.  Please advise, Thanks

## 2023-11-05 ENCOUNTER — Other Ambulatory Visit: Payer: Self-pay

## 2023-11-05 ENCOUNTER — Inpatient Hospital Stay
Admission: EM | Admit: 2023-11-05 | Discharge: 2023-11-11 | DRG: 300 | Disposition: A | Discharge status: Hospice | Payer: Medicare HMO | Attending: Internal Medicine | Admitting: Internal Medicine

## 2023-11-05 DIAGNOSIS — G4700 Insomnia, unspecified: Secondary | ICD-10-CM | POA: Diagnosis present

## 2023-11-05 DIAGNOSIS — Z66 Do not resuscitate: Secondary | ICD-10-CM | POA: Diagnosis present

## 2023-11-05 DIAGNOSIS — Z79899 Other long term (current) drug therapy: Secondary | ICD-10-CM

## 2023-11-05 DIAGNOSIS — Z515 Encounter for palliative care: Secondary | ICD-10-CM

## 2023-11-05 DIAGNOSIS — I70261 Atherosclerosis of native arteries of extremities with gangrene, right leg: Secondary | ICD-10-CM | POA: Diagnosis not present

## 2023-11-05 DIAGNOSIS — Z7982 Long term (current) use of aspirin: Secondary | ICD-10-CM

## 2023-11-05 DIAGNOSIS — D509 Iron deficiency anemia, unspecified: Secondary | ICD-10-CM | POA: Diagnosis present

## 2023-11-05 DIAGNOSIS — I96 Gangrene, not elsewhere classified: Secondary | ICD-10-CM

## 2023-11-05 DIAGNOSIS — Z7951 Long term (current) use of inhaled steroids: Secondary | ICD-10-CM

## 2023-11-05 DIAGNOSIS — I2510 Atherosclerotic heart disease of native coronary artery without angina pectoris: Secondary | ICD-10-CM | POA: Diagnosis present

## 2023-11-05 DIAGNOSIS — T8149XA Infection following a procedure, other surgical site, initial encounter: Secondary | ICD-10-CM | POA: Diagnosis not present

## 2023-11-05 DIAGNOSIS — I10 Essential (primary) hypertension: Secondary | ICD-10-CM | POA: Diagnosis present

## 2023-11-05 DIAGNOSIS — M869 Osteomyelitis, unspecified: Secondary | ICD-10-CM | POA: Diagnosis present

## 2023-11-05 DIAGNOSIS — Z9981 Dependence on supplemental oxygen: Secondary | ICD-10-CM

## 2023-11-05 DIAGNOSIS — F17210 Nicotine dependence, cigarettes, uncomplicated: Secondary | ICD-10-CM | POA: Diagnosis present

## 2023-11-05 DIAGNOSIS — J9611 Chronic respiratory failure with hypoxia: Secondary | ICD-10-CM | POA: Diagnosis present

## 2023-11-05 DIAGNOSIS — L03031 Cellulitis of right toe: Secondary | ICD-10-CM | POA: Diagnosis present

## 2023-11-05 DIAGNOSIS — K219 Gastro-esophageal reflux disease without esophagitis: Secondary | ICD-10-CM | POA: Diagnosis present

## 2023-11-05 DIAGNOSIS — E039 Hypothyroidism, unspecified: Secondary | ICD-10-CM | POA: Diagnosis present

## 2023-11-05 DIAGNOSIS — Z8711 Personal history of peptic ulcer disease: Secondary | ICD-10-CM

## 2023-11-05 DIAGNOSIS — F419 Anxiety disorder, unspecified: Secondary | ICD-10-CM | POA: Diagnosis present

## 2023-11-05 DIAGNOSIS — F32A Depression, unspecified: Secondary | ICD-10-CM | POA: Diagnosis present

## 2023-11-05 DIAGNOSIS — Z89421 Acquired absence of other right toe(s): Secondary | ICD-10-CM

## 2023-11-05 DIAGNOSIS — I739 Peripheral vascular disease, unspecified: Secondary | ICD-10-CM

## 2023-11-05 DIAGNOSIS — L89629 Pressure ulcer of left heel, unspecified stage: Secondary | ICD-10-CM | POA: Diagnosis present

## 2023-11-05 DIAGNOSIS — L89612 Pressure ulcer of right heel, stage 2: Secondary | ICD-10-CM | POA: Diagnosis present

## 2023-11-05 DIAGNOSIS — E785 Hyperlipidemia, unspecified: Secondary | ICD-10-CM | POA: Diagnosis present

## 2023-11-05 DIAGNOSIS — K224 Dyskinesia of esophagus: Secondary | ICD-10-CM | POA: Diagnosis present

## 2023-11-05 DIAGNOSIS — J441 Chronic obstructive pulmonary disease with (acute) exacerbation: Principal | ICD-10-CM | POA: Diagnosis present

## 2023-11-05 DIAGNOSIS — L089 Local infection of the skin and subcutaneous tissue, unspecified: Secondary | ICD-10-CM

## 2023-11-05 DIAGNOSIS — Z79890 Hormone replacement therapy (postmenopausal): Secondary | ICD-10-CM

## 2023-11-05 LAB — COMPREHENSIVE METABOLIC PANEL
ALT: 12 U/L (ref 0–44)
AST: 16 U/L (ref 15–41)
Albumin: 2.8 g/dL — ABNORMAL LOW (ref 3.5–5.0)
Alkaline Phosphatase: 77 U/L (ref 38–126)
Anion gap: 10 (ref 5–15)
BUN: 11 mg/dL (ref 8–23)
CO2: 26 mmol/L (ref 22–32)
Calcium: 8.5 mg/dL — ABNORMAL LOW (ref 8.9–10.3)
Chloride: 103 mmol/L (ref 98–111)
Creatinine, Ser: 0.59 mg/dL (ref 0.44–1.00)
GFR, Estimated: >60 mL/min (ref >60)
Glucose, Bld: 103 mg/dL — ABNORMAL HIGH (ref 70–99)
Potassium: 3.5 mmol/L (ref 3.5–5.1)
Sodium: 139 mmol/L (ref 135–145)
Total Bilirubin: 0.3 mg/dL (ref <1.2)
Total Protein: 6.5 g/dL (ref 6.5–8.1)

## 2023-11-05 LAB — CBC WITH DIFFERENTIAL/PLATELET
Abs Immature Granulocytes: 0.1 10*3/uL — ABNORMAL HIGH (ref 0.00–0.07)
Basophils Absolute: 0.1 10*3/uL (ref 0.0–0.1)
Basophils Relative: 0 %
Eosinophils Absolute: 0.2 10*3/uL (ref 0.0–0.5)
Eosinophils Relative: 2 %
HCT: 29.9 % — ABNORMAL LOW (ref 36.0–46.0)
Hemoglobin: 9 g/dL — ABNORMAL LOW (ref 12.0–15.0)
Immature Granulocytes: 1 %
Lymphocytes Relative: 14 %
Lymphs Abs: 1.6 10*3/uL (ref 0.7–4.0)
MCH: 27.9 pg (ref 26.0–34.0)
MCHC: 30.1 g/dL (ref 30.0–36.0)
MCV: 92.6 fL (ref 80.0–100.0)
Monocytes Absolute: 0.7 10*3/uL (ref 0.1–1.0)
Monocytes Relative: 7 %
Neutro Abs: 8.5 10*3/uL — ABNORMAL HIGH (ref 1.7–7.7)
Neutrophils Relative %: 76 %
Platelets: 611 10*3/uL — ABNORMAL HIGH (ref 150–400)
RBC: 3.23 MIL/uL — ABNORMAL LOW (ref 3.87–5.11)
RDW: 15 % (ref 11.5–15.5)
WBC: 11.2 10*3/uL — ABNORMAL HIGH (ref 4.0–10.5)
nRBC: 0 % (ref 0.0–0.2)

## 2023-11-05 LAB — LACTIC ACID, PLASMA: Lactic Acid, Venous: 1.6 mmol/L (ref 0.5–1.9)

## 2023-11-05 MED ORDER — PIPERACILLIN-TAZOBACTAM 3.375 G IVPB 30 MIN
3.3750 g | Freq: Once | INTRAVENOUS | Status: AC
  Administered 2023-11-06: 3.375 g via INTRAVENOUS
  Filled 2023-11-05 (×2): qty 50

## 2023-11-05 MED ORDER — VANCOMYCIN HCL IN DEXTROSE 1-5 GM/200ML-% IV SOLN
1000.0000 mg | Freq: Once | INTRAVENOUS | Status: AC
Start: 2023-11-06 — End: 2023-11-06
  Administered 2023-11-06: 1000 mg via INTRAVENOUS
  Filled 2023-11-05: qty 200

## 2023-11-05 MED ORDER — IPRATROPIUM-ALBUTEROL 0.5-2.5 (3) MG/3ML IN SOLN
3.0000 mL | Freq: Once | RESPIRATORY_TRACT | Status: AC
  Administered 2023-11-05: 3 mL via RESPIRATORY_TRACT
  Filled 2023-11-05: qty 3

## 2023-11-05 MED ORDER — OXYCODONE-ACETAMINOPHEN 5-325 MG PO TABS
1.0000 | ORAL_TABLET | Freq: Once | ORAL | Status: AC
  Administered 2023-11-05: 1 via ORAL
  Filled 2023-11-05: qty 1

## 2023-11-05 MED ORDER — IPRATROPIUM-ALBUTEROL 0.5-2.5 (3) MG/3ML IN SOLN
3.0000 mL | Freq: Once | RESPIRATORY_TRACT | Status: AC
  Administered 2023-11-06: 3 mL via RESPIRATORY_TRACT
  Filled 2023-11-05: qty 3

## 2023-11-05 MED ORDER — SODIUM CHLORIDE 0.9 % IV BOLUS: 500.0000 mL | Freq: Once | INTRAVENOUS | Status: DC

## 2023-11-05 MED ORDER — MORPHINE SULFATE (PF) 4 MG/ML IV SOLN
4.0000 mg | Freq: Once | INTRAVENOUS | Status: AC
  Administered 2023-11-06: 4 mg via INTRAVENOUS
  Filled 2023-11-05: qty 1

## 2023-11-05 MED ORDER — METHYLPREDNISOLONE SODIUM SUCC 125 MG IJ SOLR
125.0000 mg | Freq: Once | INTRAMUSCULAR | Status: AC
Start: 2023-11-06 — End: 2023-11-06
  Administered 2023-11-06: 125 mg via INTRAVENOUS
  Filled 2023-11-05: qty 2

## 2023-11-05 NOTE — ED Triage Notes (Signed)
See first nurse note. Pt states she "always has difficulty breathing." Continues to complain of right foot pain.

## 2023-11-05 NOTE — ED Provider Triage Note (Signed)
Emergency Medicine Provider Triage Evaluation Note  [name] , a 77 y.o. female  was evaluated in triage.  Pt complains of foot pain.  Review of Systems  Positive: Foot pain, bed sore on bottom Negative:   Physical Exam  There were no vitals taken for this visit. Gen:   Awake, no distress   Resp:  Normal effort  MSK:   Moves extremities without difficulty  Other:    Medical Decision Making  Medically screening exam initiated at 1:18 PM.  Appropriate orders placed.  [name] was informed that the remainder of the evaluation will be completed by another provider, this initial triage assessment does not replace that evaluation, and the importance of remaining in the ED until their evaluation is complete.    Alison Ali, PA-C 11/05/23 1323

## 2023-11-05 NOTE — ED Provider Notes (Signed)
[facility] Provider Note    Event Date/Time   First MD Initiated Contact with Patient 11/05/23 2309     (approximate)   History   Foot Injury   HPI  [name] is a 77 y.o. female brought to the ED via EMS from home with a chief complaint of wound infection.  Wound care nurse called EMS to bring patient because she was sitting in her feces and urine all night.  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  Chronic wound on right toe which is being treated by wound care nurse.  Patient states wound is looking worse than usual with drainage.  Also history of COPD on home oxygen.  Visible shortness of breath and dry cough noted.  Denies fever/chills, chest pain, abdominal pain, nausea/vomiting or dizziness.     Past Medical History   Past Medical History:  Diagnosis Date   Anxiety    CAD (coronary artery disease)    Carpal tunnel syndrome    Cataract    Complication of anesthesia    has woken up at times   COPD (chronic obstructive pulmonary disease) (HCC)    CRP elevated 09/14/2015   Depression    Difficult intubation    Dyslipidemia    Dyspnea    DOE   Dysrhythmia    hx palpatations   Elevated sedimentation rate 09/14/2015   Esophageal spasm    Gastrointestinal parasites    GERD (gastroesophageal reflux disease)    HCAP (healthcare-associated pneumonia) 10/22/2018   Hiatal hernia    History of peptic ulcer disease    Hypertension    Hyperthyroidism    Hypothyroidism    Low magnesium levels 09/14/2015   Nicotine dependence 09/14/2015   Pelvic fracture (HCC) 2008   fall from riding a horse   Peripheral vascular disease (HCC)    Reflux    Rotator cuff injury    Sepsis (HCC) 10/22/2018   Stenosis, spinal, lumbar      Active Problem List   Patient Active Problem List   Diagnosis Date Noted   Cellulitis of great toe  of right foot 11/06/2023   Essential hypertension 11/06/2023   Hyperthyroidism 10/02/2023   SOB (shortness of breath) 10/01/2023   Dementia with behavioral disturbance (HCC) 05/11/2023   COPD (chronic obstructive pulmonary disease) (HCC) 05/09/2023   Sinus tachycardia 02/24/2023   CAD (coronary artery disease) 01/30/2023   Normocytic anemia 01/30/2023   Compression fracture of body of thoracic vertebra (HCC) 01/30/2023   Underweight 11/08/2022   Acute on chronic respiratory failure with hypoxia and hypercapnia (HCC) 06/30/2022   Dyslipidemia 06/30/2022   Subacute osteomyelitis of right foot (HCC)    Hypertension    Protein-calorie malnutrition, severe (HCC) 11/21/2021   COPD with acute exacerbation (HCC) 11/21/2021   COPD exacerbation (HCC) 11/20/2021   Chronic pain - multiple sites arthritis 11/20/2021   Benzodiazepine dependence, continuous (HCC) 11/20/2021   Peripheral vascular disease (HCC) 11/20/2021   Hyponatremia 11/20/2021   Hypocalcemia 11/20/2021   Hypoalbuminemia due to protein-calorie malnutrition (HCC) 11/20/2021   Benzodiazepine withdrawal without complication (HCC)    Malnutrition of moderate degree 10/20/2021   Absent pedal pulses 10/19/2021   Chronic hip pain (Left) 02/17/2021   Hip pain, acute (Left) 02/17/2021   Chronic use of opiate for therapeutic purpose 02/16/2021   Closed fracture of hip, sequela (Left) 02/07/2021   Fracture  of femoral neck, left, closed (HCC) 01/11/2021   Chronic respiratory failure with hypoxia (HCC) 01/11/2021   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 (HCC) 12/22/2020   PAD (peripheral artery disease) (HCC) 12/22/2020   Encounter for long-term opiate analgesic use 11/11/2020   Dry gangrene (HCC) of middle toe, right foot 11/11/2020   Foot pain, right 11/11/2020   Hard of hearing 11/11/2020   Atherosclerosis of native arteries of the extremities with gangrene (HCC) 12/09/2019   Vitamin D deficiency 11/12/2019   Pharmacologic therapy  06/04/2019   Disorder of skeletal system 06/04/2019   Problems influencing health status 06/04/2019   Compression fracture of L3 vertebra (HCC) 08/19/2018   Chronic hip pain (Bilateral) 12/31/2017   Neurogenic pain 08/29/2017   Chronic low back pain (1ry area of Pain) (Right) w/o sciatica 08/29/2017   Chronic sacroiliac joint pain (Right) 06/05/2017   Vitamin D insufficiency 03/12/2017   Right hip pain 02/20/2017   Intertrochanteric fracture of right hip, sequela 02/20/2017   B12 deficiency 02/05/2017   Pressure injury of skin 10/02/2016   Closed displaced intertrochanteric fracture of right femur (HCC) 10/02/2016   Hip fracture (HCC) 10/01/2016   Cervical facet hypertrophy (Bilateral) 05/27/2016   History of shoulder surgery 5 (Right) 05/27/2016   Lumbar foraminal stenosis (L3-4) (Left) 05/27/2016   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) 05/27/2016   Lumbar facet hypertrophy (Bilateral) 05/27/2016   Lumbar facet syndrome (Right) 05/27/2016   Lumbar grade 1 Anterolisthesis of L3 over L4 05/27/2016   Chronic shoulder pain (Bilateral) (status post multiple surgeries) (R>L) 12/08/2015   Substance use disorder Risk: High 09/14/2015   Chronic pain syndrome 09/14/2015   Cervical spondylosis 09/14/2015   Chronic neck pain (2ry area of Pain) (Bilateral) (R>L) 09/14/2015   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) 09/14/2015   Cervical facet syndrome (Bilateral) (R>L) 09/14/2015   Cervical myofascial pain syndrome 09/14/2015   Lumbar spondylosis 09/14/2015   Chronic shoulder impingement syndrome (Right) 09/14/2015   CRP elevated 09/14/2015   Elevated sedimentation rate 09/14/2015   Chronic obstructive pulmonary disease (COPD) (HCC) 09/14/2015   Chronic shoulder pain (Right) 09/14/2015   Abnormal nerve conduction studies 09/14/2015   Encounter for therapeutic drug level monitoring 09/09/2015   Long term current use of opiate analgesic 09/09/2015    Long term prescription opiate use 09/09/2015   Uncomplicated opioid dependence (HCC) 09/09/2015   Opiate use 09/09/2015   Anxiety and depression 07/11/2012   Tobacco use disorder 05/27/2011   Fatigue 05/27/2011   Hypothyroidism 11/25/2010   HLD (hyperlipidemia) 08/24/2010   Tachycardia 08/24/2010   DYSPNEA 08/24/2010     Past Surgical History   Past Surgical History:  Procedure Laterality Date   AMPUTATION TOE Right 12/26/2020   Procedure: AMPUTATION TOE-3rd Toes;  Surgeon: Gwyneth Revels, DPM;  Location: [facility];  Service: Podiatry;  Laterality: Right;   AMPUTATION TOE Right 04/22/2021   Procedure: AMPUTATION TOE- RIGHT 2ND;  Surgeon: Gwyneth Revels, DPM;  Location: [facility];  Service: Podiatry;  Laterality: Right;   AMPUTATION TOE Right 10/21/2021   Procedure: AMPUTATION TOE;  Surgeon: Rosetta Posner, DPM;  Location: [facility];  Service: Podiatry;  Laterality: Right;   AMPUTATION TOE Right 11/23/2021   Procedure: AMPUTATION TOE;  Surgeon: Linus Galas, DPM;  Location: [facility];  Service: Podiatry;  Laterality: Right;   AMPUTATION TOE Right 07/05/2022   Procedure: AMPUTATION RIGHT 5TH TOE;  Surgeon: Edwin Cap, DPM;  Location: [facility];  Service: Podiatry;  Laterality:  Right;   APPENDECTOMY     BACK SURGERY     CERVICAL FUSION   CARPAL TUNNEL RELEASE     CATARACT EXTRACTION W/PHACO Right 08/07/2017   Procedure: CATARACT EXTRACTION PHACO AND INTRAOCULAR LENS PLACEMENT (IOC);  Surgeon: Galen Manila, MD;  Location: [facility];  Service: Ophthalmology;  Laterality: Right;  [place] 00:52.0 AP% 16.8 CDE 8.74 Fluid Pack Lot # [id number]   CATARACT EXTRACTION W/PHACO Left 09/04/2017   Procedure: CATARACT EXTRACTION PHACO AND INTRAOCULAR LENS PLACEMENT (IOC);  Surgeon: Galen Manila, MD;  Location: [facility];  Service: Ophthalmology;  Laterality: Left;  [place] 00:34 AP% 17.0 CDE 5.80 Fluid pack lot # [id number]   CESAREAN SECTION     CHOLECYSTECTOMY     FRACTURE SURGERY     HIP SURGERY      INTRAMEDULLARY (IM) NAIL INTERTROCHANTERIC Right 10/01/2016   Procedure: INTRAMEDULLARY (IM) NAIL INTERTROCHANTRIC;  Surgeon: Christena Flake, MD;  Location: [facility];  Service: Orthopedics;  Laterality: Right;   INTRAMEDULLARY (IM) NAIL INTERTROCHANTERIC Left 01/14/2021   Procedure: INTRAMEDULLARY (IM) NAIL INTERTROCHANTRIC;  Surgeon: Lyndle Herrlich, MD;  Location: [facility];  Service: Orthopedics;  Laterality: Left;   KYPHOPLASTY N/A 08/20/2018   Procedure: [id number];  Surgeon: Kennedy Bucker, MD;  Location: [facility];  Service: Orthopedics;  Laterality: N/A;   LOWER EXTREMITY ANGIOGRAPHY Right 12/18/2019   Procedure: LOWER EXTREMITY ANGIOGRAPHY;  Surgeon: Annice Needy, MD;  Location: [facility];  Service: Cardiovascular;  Laterality: Right;   LOWER EXTREMITY ANGIOGRAPHY Right 12/02/2020   Procedure: LOWER EXTREMITY ANGIOGRAPHY;  Surgeon: Annice Needy, MD;  Location: [facility];  Service: Cardiovascular;  Laterality: Right;   LOWER EXTREMITY ANGIOGRAPHY Right 12/23/2020   Procedure: Lower Extremity Angiography;  Surgeon: Annice Needy, MD;  Location: [facility];  Service: Cardiovascular;  Laterality: Right;   LOWER EXTREMITY ANGIOGRAPHY Right 12/24/2020   Procedure: Lower Extremity Angiography;  Surgeon: Annice Needy, MD;  Location: [facility];  Service: Cardiovascular;  Laterality: Right;   LOWER EXTREMITY ANGIOGRAPHY Right 10/20/2021   Procedure: Lower Extremity Angiography;  Surgeon: Annice Needy, MD;  Location: [facility];  Service: Cardiovascular;  Laterality: Right;   LOWER EXTREMITY ANGIOGRAPHY Right 07/03/2022   Procedure: Lower Extremity Angiography;  Surgeon: Annice Needy, MD;  Location: [facility];  Service: Cardiovascular;  Laterality: Right;   NECK SURGERY     NOSE SURGERY     ROTATOR CUFF REPAIR     x2   SHOULDER ARTHROSCOPY  12/07/2011   Procedure: ARTHROSCOPY SHOULDER;  Surgeon: Loreta Ave, MD;  Location: [facility];  Service: Orthopedics;  Laterality: Right;  Debridement Partial Cuff Tear, Release Coracoacromial Ligament   SHOULDER SURGERY  12/07/2011   right     Home Medications   Prior to Admission medications   Medication Sig Start Date End Date Taking? Authorizing Provider  ALPRAZolam Prudy Feeler) 1 MG tablet Take 1 tablet (1 mg total) by mouth 3 (three) times daily as needed. SNF use only 10/08/23  Yes Sreenath, Sudheer B, MD  oxyCODONE-acetaminophen (PERCOCET/ROXICET) 5-325 MG tablet Take 1 tablet by mouth every 4 (four) hours as needed. 10/22/23  Yes [provider]  traZODone (DESYREL) 150 MG tablet Take 300 mg by mouth at bedtime.   Yes [provider]  albuterol (VENTOLIN HFA) 108 (90 Base) MCG/ACT inhaler Inhale 1-2 puffs into the lungs every 4 (four) hours as needed for shortness of breath or wheezing.  06/28/22  Yes [provider]  amLODipine (NORVASC) 5 MG tablet Take 1 tablet (5 mg total) by mouth daily. Patient not taking: Reported on 10/06/2023 05/10/23   Leeroy Bock, MD  ammonium lactate (LAC-HYDRIN) 12 % lotion APPLY TO AFFECTED AREA AS NEEDED FOR DRY SKIN 08/09/23   Candelaria Stagers, DPM  ASPIRIN LOW DOSE 81 MG tablet TAKE 1 TABLET BY MOUTH EVERY DAY 05/07/23  Yes Georgiana Spinner, NP  atorvastatin (LIPITOR) 10 MG tablet Take 1 tablet (10 mg total) by mouth daily. Patient not taking: Reported on 05/06/2023 11/10/22 11/10/23  Arnetha Courser, MD  benzonatate (TESSALON) 100 MG capsule Take 1 capsule (100 mg total) by mouth 3 (three) times daily as needed for cough. 03/02/23   Gillis Santa, MD  COMBIVENT RESPIMAT 20-100 MCG/ACT AERS respimat Inhale 1-2 puffs into the lungs in the morning, at noon, in the evening, and at bedtime. 09/03/23  Yes [provider]  feeding supplement (ENSURE ENLIVE / ENSURE PLUS) LIQD Take 237 mLs by mouth 3 (three) times daily between meals. 02/03/23   Darlin Priestly, MD  fluticasone-salmeterol (ADVAIR) 250-50 MCG/ACT AEPB Inhale 1  puff into the lungs 2 (two) times daily as needed.   Yes [provider]  furosemide (LASIX) 20 MG tablet Take 20 mg by mouth daily. 07/20/23  Yes [provider]  hydrOXYzine (ATARAX) 25 MG tablet Take 1 tablet (25 mg total) by mouth 3 (three) times daily as needed for anxiety. 03/02/23  Yes Gillis Santa, MD  ipratropium-albuterol (DUONEB) 0.5-2.5 (3) MG/3ML SOLN Take 3 mLs by nebulization every 6 (six) hours as needed.   Yes [provider]  iron polysaccharides (NIFEREX) 150 MG capsule Take 1 capsule (150 mg total) by mouth daily. 03/03/23 06/01/23  Gillis Santa, MD  levothyroxine (SYNTHROID) 50 MCG tablet Take 50 mcg by mouth every morning.   Yes [provider]  melatonin 5 MG TABS Take 1 tablet (5 mg total) by mouth at bedtime. 03/02/23 03/01/24  Gillis Santa, MD  metoprolol tartrate (LOPRESSOR) 25 MG tablet Take 1 tablet (25 mg total) by mouth 2 (two) times daily. Skip the dose if systolic BP less than 130 mmHg and/or heart rate less than 65 beats per minute 03/02/23 10/06/23  Gillis Santa, MD  Multiple Vitamin (MULTIVITAMIN WITH MINERALS) TABS tablet Take 1 tablet by mouth daily. 11/10/22   Arnetha Courser, MD  omeprazole (PRILOSEC) 40 MG capsule Take 40 mg by mouth daily.   Yes [provider]  polyethylene glycol (MIRALAX / GLYCOLAX) 17 g packet Take 17 g by mouth daily as needed. 05/16/23   Lolita Patella B, MD  potassium chloride SA (KLOR-CON M) 20 MEQ tablet Take 20 mEq by mouth daily.   Yes [provider]  risperiDONE (RISPERDAL) 0.25 MG tablet Take 1 tablet (0.25 mg total) by mouth at bedtime. 03/02/23 10/06/23  Gillis Santa, MD  thiamine (VITAMIN B-1) 100 MG tablet Take 1 tablet (100 mg total) by mouth daily. Patient not taking: Reported on 10/06/2023 11/10/22   Arnetha Courser, MD     Allergies  Patient has no known allergies.   Family History   Family History  Problem Relation Age of Onset   Alcoholism Brother    Aneurysm  Other      Physical Exam  Triage Vital Signs: ED Triage Vitals  Encounter Vitals Group     BP 11/05/23 1320 (!) 92/58     Systolic BP Percentile --      Diastolic BP  Percentile --      Pulse Rate 11/05/23 1320 78     Resp 11/05/23 1320 20     Temp 11/05/23 1320 98 F (36.7 C)     Temp Source 11/05/23 1320 Oral     SpO2 11/05/23 1320 97 %     Weight --      Height --      Head Circumference --      Peak Flow --      Pain Score 11/05/23 1319 10     Pain Loc --      Pain Education --      Exclude from Growth Chart --     Updated Vital Signs: BP (!) 143/82   Pulse (!) 105   Temp 98.3 F (36.8 C) (Oral)   Resp 17   Wt 52.3 kg   SpO2 98%   BMI 19.81 kg/m    General: Awake, mild distress.  CV:  Tachycardic.  Good peripheral perfusion.  Resp:  Increased effort.  Diminished, scattered wheezing. Abd:  Nontender.  No distention.  Other:  Right great toe: Open wound with areas of wall tissue and necrosis.  Foot is cool but not cold compared to the left.  1+ palpable distal pulses.    ED Results / Procedures / Treatments  Labs (all labs ordered are listed, but only abnormal results are displayed) Labs Reviewed  COMPREHENSIVE METABOLIC PANEL - Abnormal; Notable for the following components:      Result Value   Glucose, Bld 103 (*)    Calcium 8.5 (*)    Albumin 2.8 (*)    All other components within normal limits  CBC WITH DIFFERENTIAL/PLATELET - Abnormal; Notable for the following components:   WBC 11.2 (*)    RBC 3.23 (*)    Hemoglobin 9.0 (*)    HCT 29.9 (*)    Platelets 611 (*)    Neutro Abs 8.5 (*)    Abs Immature Granulocytes 0.10 (*)    All other components within normal limits  BASIC METABOLIC PANEL - Abnormal; Notable for the following components:   Glucose, Bld 178 (*)    Calcium 8.3 (*)    All other components within normal limits  CBC - Abnormal; Notable for the following components:   RBC 3.36 (*)    Hemoglobin 9.5 (*)    HCT 30.6 (*)     Platelets 521 (*)    All other components within normal limits  AEROBIC/ANAEROBIC CULTURE W GRAM STAIN (SURGICAL/DEEP WOUND)  CULTURE, BLOOD (ROUTINE X 2)  CULTURE, BLOOD (ROUTINE X 2)  LACTIC ACID, PLASMA  TROPONIN I (HIGH SENSITIVITY)     EKG  ED ECG REPORT I, Mallori Araque J, the attending physician, personally viewed and interpreted this ECG.   Date: 11/06/2023  EKG Time: 0045  Rate: 134  Rhythm: sinus tachycardia  Axis: Normal  Intervals:none  ST&T Change: Nonspecific    RADIOLOGY I have independently visualized and interpreted patient's imaging studies as well as noted the radiology interpretation:  Chest x-ray: Increased interstitial markings and groundglass opacities in right lower lung  Right foot: No evidence of osteomyelitis  Official radiology report(s): DG Foot 2 Views Right Result Date: 11/06/2023 CLINICAL DATA:  Wound on right foot. 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 Demineralization. No evidence of acute fracture. No dislocation. No evidence of osteomyelitis. Soft tissue swelling about the forefoot and first toe. IMPRESSION: Soft tissue swelling about  the forefoot and first toe. No evidence of osteomyelitis. Electronically Signed   By: Minerva Fester M.D.   On: 11/06/2023 02:06   DG Chest Port 1 View Result Date: 11/06/2023 CLINICAL DATA:  Shortness of breath EXAM: PORTABLE CHEST 1 VIEW COMPARISON:  10/01/2023 FINDINGS: Stable cardiomediastinal silhouette. Aortic atherosclerotic calcification. Hyperinflation and chronic bronchitic change. Increased interstitial markings and ground-glass opacities in the right lower lung compared to 10/01/2023. This may be projectional or due to infection superimposed on emphysema. No pleural effusion or pneumothorax. Cervical spine fusion. IMPRESSION: Increased interstitial markings and ground-glass opacities in the right lower lung compared to 10/01/2023.  This may be due to confluence of shadows or infection superimposed on emphysema. Electronically Signed   By: Minerva Fester M.D.   On: 11/06/2023 02:04     PROCEDURES:  Critical Care performed: No  Procedures   MEDICATIONS ORDERED IN ED: Medications  furosemide (LASIX) tablet 20 mg (has no administration in time range)  metoprolol tartrate (LOPRESSOR) tablet 25 mg (has no administration in time range)  ALPRAZolam (XANAX) tablet 1 mg (has no administration in time range)  risperiDONE (RISPERDAL) tablet 0.25 mg (0.25 mg Oral Given 11/06/23 0322)  levothyroxine (SYNTHROID) tablet 50 mcg (50 mcg Oral Given 11/06/23 0558)  iron polysaccharides (NIFEREX) capsule 150 mg (has no administration in time range)  polyethylene glycol (MIRALAX / GLYCOLAX) packet 17 g (has no administration in time range)  pantoprazole (PROTONIX) EC tablet 40 mg (has no administration in time range)  feeding supplement (ENSURE ENLIVE / ENSURE PLUS) liquid 237 mL (has no administration in time range)  multivitamin with minerals tablet 1 tablet (has no administration in time range)  potassium chloride SA (KLOR-CON M) CR tablet 20 mEq (has no administration in time range)  ipratropium-albuterol (DUONEB) 0.5-2.5 (3) MG/3ML nebulizer solution 3 mL (has no administration in time range)  enoxaparin (LOVENOX) injection 40 mg (has no administration in time range)  methylPREDNISolone sodium succinate (SOLU-MEDROL) 40 mg/mL injection 40 mg (has no administration in time range)    Followed by  predniSONE (DELTASONE) tablet 40 mg (has no administration in time range)  acetaminophen (TYLENOL) tablet 650 mg (has no administration in time range)    Or  acetaminophen (TYLENOL) suppository 650 mg (has no administration in time range)  magnesium hydroxide (MILK OF MAGNESIA) suspension 30 mL (has no administration in time range)  ondansetron (ZOFRAN) tablet 4 mg (has no administration in time range)    Or  ondansetron (ZOFRAN)  injection 4 mg (has no administration in time range)  0.9 %  sodium chloride infusion ( Intravenous New Bag/Given 11/06/23 0324)  ipratropium-albuterol (DUONEB) 0.5-2.5 (3) MG/3ML nebulizer solution 3 mL (has no administration in time range)  guaiFENesin (MUCINEX) 12 hr tablet 600 mg (600 mg Oral Given 11/06/23 0321)  chlorpheniramine-HYDROcodone (TUSSIONEX) 10-8 MG/5ML suspension 5 mL (has no administration in time range)  traZODone (DESYREL) tablet 300 mg (has no administration in time range)  fentaNYL (SUBLIMAZE) injection 25 mcg (25 mcg Intravenous Given 11/06/23 0328)  piperacillin-tazobactam (ZOSYN) IVPB 3.375 g (3.375 g Intravenous New Bag/Given 11/06/23 0618)  vancomycin (VANCOREADY) IVPB 1250 mg/250 mL (has no administration in time range)  oxyCODONE-acetaminophen (PERCOCET/ROXICET) 5-325 MG per tablet 1 tablet (1 tablet Oral Given 11/05/23 1328)  ipratropium-albuterol (DUONEB) 0.5-2.5 (3) MG/3ML nebulizer solution 3 mL (3 mLs Nebulization Given 11/05/23 2331)  morphine (PF) 4 MG/ML injection 4 mg (4 mg Intravenous Given 11/06/23 0023)  ipratropium-albuterol (DUONEB) 0.5-2.5 (3) MG/3ML nebulizer solution 3 mL (3 mLs Nebulization  Given 11/06/23 0035)  methylPREDNISolone sodium succinate (SOLU-MEDROL) 125 mg/2 mL injection 125 mg (125 mg Intravenous Given 11/06/23 0022)  vancomycin (VANCOCIN) IVPB 1000 mg/200 mL premix (0 mg Intravenous Stopped 11/06/23 0251)  piperacillin-tazobactam (ZOSYN) IVPB 3.375 g (0 g Intravenous Stopped 11/06/23 0103)  sodium chloride 0.9 % bolus 1,000 mL (0 mLs Intravenous Stopped 11/06/23 0309)  ALPRAZolam (XANAX) tablet 1 mg (1 mg Oral Given 11/06/23 0140)  fentaNYL (SUBLIMAZE) injection 25 mcg (25 mcg Intravenous Given 11/06/23 0616)     IMPRESSION / MDM / ASSESSMENT AND PLAN / ED COURSE  I reviewed the triage vital signs and the nursing notes.                             77 year old female presenting with wound infection and shortness of breath.  Differential includes, but is not limited to, viral syndrome, bronchitis including COPD exacerbation, pneumonia, reactive airway disease including asthma, CHF including exacerbation with or without pulmonary/interstitial edema, pneumothorax, ACS, thoracic trauma, and pulmonary embolism.  I have personally reviewed patient's records and note her hospitalization 10/01/2023 for COPD exacerbation.  Patient's presentation is most consistent with acute presentation with potential threat to life or bodily function.  The patient is on the cardiac monitor to evaluate for evidence of arrhythmia and/or significant heart rate changes.  Laboratory results demonstrate mild leukocytosis with WBC 11, unremarkable electrolytes, negative lactate. Will obtain wound cultures, xray to evaluate osteomyelitis and consult hospitalist services for evaluation and admission.   FINAL CLINICAL IMPRESSION(S) / ED DIAGNOSES   Final diagnoses:  COPD with acute exacerbation (HCC)  Wound infection     Rx / DC Orders   ED Discharge Orders     None        Note:  This document was prepared using Dragon voice recognition software and may include unintentional dictation errors.   Irean Hong, MD 11/06/23 [phone]

## 2023-11-05 NOTE — ED Notes (Signed)
First Nurse Note: Pt to ED via [facility] from home. Wound care nurse called EMS to bring pt in because she was in her stool and urine all night last night. Pt has a wound on her right foot that she is being treated for. Pt left [facility] last week but states that she thinks she left too early. Pt is on chronic oxygen  CBG 124 BP 135/77 HR 114

## 2023-11-06 ENCOUNTER — Emergency Department: Payer: Medicare HMO

## 2023-11-06 DIAGNOSIS — I743 Embolism and thrombosis of arteries of the lower extremities: Secondary | ICD-10-CM | POA: Diagnosis not present

## 2023-11-06 DIAGNOSIS — Z66 Do not resuscitate: Secondary | ICD-10-CM | POA: Diagnosis present

## 2023-11-06 DIAGNOSIS — I10 Essential (primary) hypertension: Secondary | ICD-10-CM | POA: Diagnosis present

## 2023-11-06 DIAGNOSIS — Z7951 Long term (current) use of inhaled steroids: Secondary | ICD-10-CM | POA: Diagnosis not present

## 2023-11-06 DIAGNOSIS — Z9981 Dependence on supplemental oxygen: Secondary | ICD-10-CM | POA: Diagnosis not present

## 2023-11-06 DIAGNOSIS — E785 Hyperlipidemia, unspecified: Secondary | ICD-10-CM | POA: Diagnosis present

## 2023-11-06 DIAGNOSIS — M869 Osteomyelitis, unspecified: Secondary | ICD-10-CM | POA: Diagnosis present

## 2023-11-06 DIAGNOSIS — K219 Gastro-esophageal reflux disease without esophagitis: Secondary | ICD-10-CM | POA: Diagnosis present

## 2023-11-06 DIAGNOSIS — E039 Hypothyroidism, unspecified: Secondary | ICD-10-CM

## 2023-11-06 DIAGNOSIS — Z515 Encounter for palliative care: Secondary | ICD-10-CM | POA: Diagnosis not present

## 2023-11-06 DIAGNOSIS — L89612 Pressure ulcer of right heel, stage 2: Secondary | ICD-10-CM | POA: Diagnosis present

## 2023-11-06 DIAGNOSIS — Z7982 Long term (current) use of aspirin: Secondary | ICD-10-CM | POA: Diagnosis not present

## 2023-11-06 DIAGNOSIS — Z89421 Acquired absence of other right toe(s): Secondary | ICD-10-CM | POA: Diagnosis not present

## 2023-11-06 DIAGNOSIS — F419 Anxiety disorder, unspecified: Secondary | ICD-10-CM

## 2023-11-06 DIAGNOSIS — I739 Peripheral vascular disease, unspecified: Secondary | ICD-10-CM | POA: Diagnosis not present

## 2023-11-06 DIAGNOSIS — J9611 Chronic respiratory failure with hypoxia: Secondary | ICD-10-CM | POA: Diagnosis present

## 2023-11-06 DIAGNOSIS — L89629 Pressure ulcer of left heel, unspecified stage: Secondary | ICD-10-CM | POA: Diagnosis present

## 2023-11-06 DIAGNOSIS — J441 Chronic obstructive pulmonary disease with (acute) exacerbation: Secondary | ICD-10-CM

## 2023-11-06 DIAGNOSIS — L03031 Cellulitis of right toe: Secondary | ICD-10-CM | POA: Diagnosis present

## 2023-11-06 DIAGNOSIS — I96 Gangrene, not elsewhere classified: Secondary | ICD-10-CM | POA: Diagnosis not present

## 2023-11-06 DIAGNOSIS — Z79890 Hormone replacement therapy (postmenopausal): Secondary | ICD-10-CM | POA: Diagnosis not present

## 2023-11-06 DIAGNOSIS — F32A Depression, unspecified: Secondary | ICD-10-CM

## 2023-11-06 DIAGNOSIS — T82868A Thrombosis of vascular prosthetic devices, implants and grafts, initial encounter: Secondary | ICD-10-CM | POA: Diagnosis not present

## 2023-11-06 DIAGNOSIS — Z79899 Other long term (current) drug therapy: Secondary | ICD-10-CM | POA: Diagnosis not present

## 2023-11-06 DIAGNOSIS — T8149XA Infection following a procedure, other surgical site, initial encounter: Secondary | ICD-10-CM | POA: Diagnosis present

## 2023-11-06 DIAGNOSIS — G4700 Insomnia, unspecified: Secondary | ICD-10-CM | POA: Diagnosis present

## 2023-11-06 DIAGNOSIS — L089 Local infection of the skin and subcutaneous tissue, unspecified: Secondary | ICD-10-CM | POA: Diagnosis not present

## 2023-11-06 DIAGNOSIS — I70261 Atherosclerosis of native arteries of extremities with gangrene, right leg: Secondary | ICD-10-CM | POA: Diagnosis present

## 2023-11-06 DIAGNOSIS — F17210 Nicotine dependence, cigarettes, uncomplicated: Secondary | ICD-10-CM | POA: Diagnosis present

## 2023-11-06 DIAGNOSIS — I2510 Atherosclerotic heart disease of native coronary artery without angina pectoris: Secondary | ICD-10-CM | POA: Diagnosis present

## 2023-11-06 DIAGNOSIS — D509 Iron deficiency anemia, unspecified: Secondary | ICD-10-CM | POA: Diagnosis present

## 2023-11-06 LAB — TROPONIN I (HIGH SENSITIVITY): Troponin I (High Sensitivity): 5 ng/L (ref <18)

## 2023-11-06 LAB — BASIC METABOLIC PANEL
Anion gap: 8 (ref 5–15)
BUN: 13 mg/dL (ref 8–23)
CO2: 26 mmol/L (ref 22–32)
Calcium: 8.3 mg/dL — ABNORMAL LOW (ref 8.9–10.3)
Chloride: 104 mmol/L (ref 98–111)
Creatinine, Ser: 0.68 mg/dL (ref 0.44–1.00)
GFR, Estimated: >60 mL/min (ref >60)
Glucose, Bld: 178 mg/dL — ABNORMAL HIGH (ref 70–99)
Potassium: 3.8 mmol/L (ref 3.5–5.1)
Sodium: 138 mmol/L (ref 135–145)

## 2023-11-06 LAB — CBC
HCT: 30.6 % — ABNORMAL LOW (ref 36.0–46.0)
Hemoglobin: 9.5 g/dL — ABNORMAL LOW (ref 12.0–15.0)
MCH: 28.3 pg (ref 26.0–34.0)
MCHC: 31 g/dL (ref 30.0–36.0)
MCV: 91.1 fL (ref 80.0–100.0)
Platelets: 521 10*3/uL — ABNORMAL HIGH (ref 150–400)
RBC: 3.36 MIL/uL — ABNORMAL LOW (ref 3.87–5.11)
RDW: 14.9 % (ref 11.5–15.5)
WBC: 10.1 10*3/uL (ref 4.0–10.5)
nRBC: 0 % (ref 0.0–0.2)

## 2023-11-06 LAB — LIPID PANEL
Cholesterol: 138 mg/dL (ref 0–200)
HDL: 51 mg/dL (ref >40)
LDL Cholesterol: 80 mg/dL (ref 0–99)
Total CHOL/HDL Ratio: 2.7 {ratio}
Triglycerides: 35 mg/dL (ref <150)
VLDL: 7 mg/dL (ref 0–40)

## 2023-11-06 MED ORDER — ALPRAZOLAM 0.5 MG PO TABS
1.0000 mg | ORAL_TABLET | Freq: Once | ORAL | Status: AC
  Administered 2023-11-06: 1 mg via ORAL
  Filled 2023-11-06: qty 2

## 2023-11-06 MED ORDER — ACETAMINOPHEN 325 MG PO TABS: 650.0000 mg | ORAL_TABLET | Freq: Four times a day (QID) | ORAL | Status: DC | PRN

## 2023-11-06 MED ORDER — LEVOTHYROXINE SODIUM 50 MCG PO TABS
50.0000 ug | ORAL_TABLET | Freq: Every day | ORAL | Status: DC
  Administered 2023-11-06 – 2023-11-09 (×4): 50 ug via ORAL
  Filled 2023-11-06 (×4): qty 1

## 2023-11-06 MED ORDER — ALPRAZOLAM 0.5 MG PO TABS
1.0000 mg | ORAL_TABLET | Freq: Three times a day (TID) | ORAL | Status: DC | PRN
  Administered 2023-11-06 – 2023-11-09 (×5): 1 mg via ORAL
  Filled 2023-11-06: qty 1
  Filled 2023-11-06 (×2): qty 2
  Filled 2023-11-06 (×2): qty 1

## 2023-11-06 MED ORDER — FUROSEMIDE 20 MG PO TABS
20.0000 mg | ORAL_TABLET | Freq: Every day | ORAL | Status: DC
  Administered 2023-11-06 – 2023-11-07 (×2): 20 mg via ORAL
  Filled 2023-11-06 (×2): qty 1

## 2023-11-06 MED ORDER — ENOXAPARIN SODIUM 40 MG/0.4ML IJ SOSY
40.0000 mg | PREFILLED_SYRINGE | INTRAMUSCULAR | Status: DC
  Administered 2023-11-06 – 2023-11-07 (×2): 40 mg via SUBCUTANEOUS
  Filled 2023-11-06 (×2): qty 0.4

## 2023-11-06 MED ORDER — SODIUM CHLORIDE 0.9 % IV SOLN: 1.0000 g | INTRAVENOUS | Status: DC

## 2023-11-06 MED ORDER — SODIUM CHLORIDE 0.9 % IV SOLN: INTRAVENOUS | Status: AC

## 2023-11-06 MED ORDER — OXYCODONE-ACETAMINOPHEN 5-325 MG PO TABS
1.0000 | ORAL_TABLET | ORAL | Status: DC | PRN
  Administered 2023-11-06 – 2023-11-07 (×5): 1 via ORAL
  Filled 2023-11-06 (×6): qty 1

## 2023-11-06 MED ORDER — ENSURE ENLIVE PO LIQD
237.0000 mL | Freq: Three times a day (TID) | ORAL | Status: DC
  Administered 2023-11-06 – 2023-11-09 (×4): 237 mL via ORAL

## 2023-11-06 MED ORDER — ACETAMINOPHEN 650 MG RE SUPP: 650.0000 mg | Freq: Four times a day (QID) | RECTAL | Status: DC | PRN

## 2023-11-06 MED ORDER — TRAZODONE HCL 50 MG PO TABS
150.0000 mg | ORAL_TABLET | Freq: Every day | ORAL | Status: DC
  Administered 2023-11-06: 150 mg via ORAL
  Filled 2023-11-06: qty 1

## 2023-11-06 MED ORDER — PIPERACILLIN-TAZOBACTAM 3.375 G IVPB 30 MIN: 3.3750 g | Freq: Four times a day (QID) | INTRAVENOUS | Status: DC

## 2023-11-06 MED ORDER — FLUTICASONE FUROATE-VILANTEROL 200-25 MCG/ACT IN AEPB
1.0000 | INHALATION_SPRAY | Freq: Every day | RESPIRATORY_TRACT | Status: DC
  Administered 2023-11-06 – 2023-11-08 (×3): 1 via RESPIRATORY_TRACT
  Filled 2023-11-06 (×2): qty 28

## 2023-11-06 MED ORDER — PANTOPRAZOLE SODIUM 40 MG PO TBEC
40.0000 mg | DELAYED_RELEASE_TABLET | Freq: Every day | ORAL | Status: DC
  Administered 2023-11-06 – 2023-11-07 (×2): 40 mg via ORAL
  Filled 2023-11-06 (×2): qty 1

## 2023-11-06 MED ORDER — ONDANSETRON HCL 4 MG PO TABS: 4.0000 mg | ORAL_TABLET | Freq: Four times a day (QID) | ORAL | Status: DC | PRN

## 2023-11-06 MED ORDER — PIPERACILLIN-TAZOBACTAM 3.375 G IVPB
3.3750 g | Freq: Three times a day (TID) | INTRAVENOUS | Status: DC
Start: 2023-11-06 — End: 2023-11-09
  Administered 2023-11-06 – 2023-11-09 (×8): 3.375 g via INTRAVENOUS
  Filled 2023-11-06 (×9): qty 50

## 2023-11-06 MED ORDER — ONDANSETRON HCL 4 MG/2ML IJ SOLN
4.0000 mg | Freq: Four times a day (QID) | INTRAMUSCULAR | Status: DC | PRN
Start: 2023-11-06 — End: 2023-11-11

## 2023-11-06 MED ORDER — FENTANYL CITRATE PF 50 MCG/ML IJ SOSY
25.0000 ug | PREFILLED_SYRINGE | INTRAMUSCULAR | Status: DC | PRN
  Administered 2023-11-06: 25 ug via INTRAVENOUS
  Filled 2023-11-06 (×2): qty 1

## 2023-11-06 MED ORDER — VANCOMYCIN HCL 1250 MG/250ML IV SOLN
1250.0000 mg | INTRAVENOUS | Status: DC
  Administered 2023-11-07: 1250 mg via INTRAVENOUS
  Filled 2023-11-06: qty 250

## 2023-11-06 MED ORDER — METOPROLOL TARTRATE 25 MG PO TABS
25.0000 mg | ORAL_TABLET | Freq: Two times a day (BID) | ORAL | Status: DC
  Administered 2023-11-06 – 2023-11-10 (×6): 25 mg via ORAL
  Filled 2023-11-06 (×9): qty 1

## 2023-11-06 MED ORDER — FENTANYL CITRATE PF 50 MCG/ML IJ SOSY
25.0000 ug | PREFILLED_SYRINGE | Freq: Once | INTRAMUSCULAR | Status: AC
  Administered 2023-11-06: 25 ug via INTRAVENOUS
  Filled 2023-11-06: qty 1

## 2023-11-06 MED ORDER — IPRATROPIUM-ALBUTEROL 0.5-2.5 (3) MG/3ML IN SOLN
3.0000 mL | Freq: Four times a day (QID) | RESPIRATORY_TRACT | Status: DC
  Administered 2023-11-06 – 2023-11-07 (×3): 3 mL via RESPIRATORY_TRACT
  Filled 2023-11-06 (×4): qty 3

## 2023-11-06 MED ORDER — TRAZODONE HCL 100 MG PO TABS
300.0000 mg | ORAL_TABLET | Freq: Every day | ORAL | Status: DC
Start: 2023-11-06 — End: 2023-11-06

## 2023-11-06 MED ORDER — SODIUM CHLORIDE 0.9 % IV BOLUS
1000.0000 mL | Freq: Once | INTRAVENOUS | Status: AC
  Administered 2023-11-06: 1000 mL via INTRAVENOUS

## 2023-11-06 MED ORDER — RISPERIDONE 0.5 MG PO TABS
0.2500 mg | ORAL_TABLET | Freq: Every day | ORAL | Status: DC
  Administered 2023-11-06 – 2023-11-10 (×3): 0.25 mg via ORAL
  Filled 2023-11-06 (×8): qty 1

## 2023-11-06 MED ORDER — TRAZODONE HCL 100 MG PO TABS
300.0000 mg | ORAL_TABLET | Freq: Every day | ORAL | Status: DC
  Administered 2023-11-06: 300 mg via ORAL
  Filled 2023-11-06: qty 3

## 2023-11-06 MED ORDER — PREDNISONE 10 MG PO TABS
40.0000 mg | ORAL_TABLET | Freq: Every day | ORAL | Status: DC
Start: 2023-11-07 — End: 2023-11-09
  Administered 2023-11-07 – 2023-11-08 (×2): 40 mg via ORAL
  Filled 2023-11-06 (×2): qty 2

## 2023-11-06 MED ORDER — POLYETHYLENE GLYCOL 3350 17 G PO PACK: 17.0000 g | PACK | Freq: Every day | ORAL | Status: DC | PRN

## 2023-11-06 MED ORDER — IPRATROPIUM-ALBUTEROL 0.5-2.5 (3) MG/3ML IN SOLN: 3.0000 mL | Freq: Four times a day (QID) | RESPIRATORY_TRACT | Status: DC

## 2023-11-06 MED ORDER — FENTANYL CITRATE PF 50 MCG/ML IJ SOSY
25.0000 ug | PREFILLED_SYRINGE | INTRAMUSCULAR | Status: DC | PRN
  Administered 2023-11-06 – 2023-11-07 (×4): 25 ug via INTRAVENOUS
  Filled 2023-11-06 (×3): qty 1

## 2023-11-06 MED ORDER — ADULT MULTIVITAMIN W/MINERALS CH
1.0000 | ORAL_TABLET | Freq: Every day | ORAL | Status: DC
  Administered 2023-11-06 – 2023-11-07 (×2): 1 via ORAL
  Filled 2023-11-06 (×2): qty 1

## 2023-11-06 MED ORDER — POLYSACCHARIDE IRON COMPLEX 150 MG PO CAPS
150.0000 mg | ORAL_CAPSULE | Freq: Every day | ORAL | Status: DC
  Administered 2023-11-06 – 2023-11-07 (×2): 150 mg via ORAL
  Filled 2023-11-06 (×4): qty 1

## 2023-11-06 MED ORDER — TRAZODONE HCL 50 MG PO TABS: 25.0000 mg | ORAL_TABLET | Freq: Every evening | ORAL | Status: DC | PRN

## 2023-11-06 MED ORDER — GUAIFENESIN ER 600 MG PO TB12
600.0000 mg | ORAL_TABLET | Freq: Two times a day (BID) | ORAL | Status: DC
  Administered 2023-11-06 – 2023-11-10 (×7): 600 mg via ORAL
  Filled 2023-11-06 (×9): qty 1

## 2023-11-06 MED ORDER — HYDROCOD POLI-CHLORPHE POLI ER 10-8 MG/5ML PO SUER: 5.0000 mL | Freq: Two times a day (BID) | ORAL | Status: DC | PRN

## 2023-11-06 MED ORDER — METHYLPREDNISOLONE SODIUM SUCC 40 MG IJ SOLR
40.0000 mg | Freq: Two times a day (BID) | INTRAMUSCULAR | Status: AC
  Administered 2023-11-06: 40 mg via INTRAVENOUS
  Filled 2023-11-06: qty 1

## 2023-11-06 MED ORDER — POTASSIUM CHLORIDE CRYS ER 20 MEQ PO TBCR
20.0000 meq | EXTENDED_RELEASE_TABLET | Freq: Every day | ORAL | Status: DC
Start: 2023-11-06 — End: 2023-11-09
  Administered 2023-11-06 – 2023-11-07 (×2): 20 meq via ORAL
  Filled 2023-11-06 (×2): qty 1

## 2023-11-06 MED ORDER — MAGNESIUM HYDROXIDE 400 MG/5ML PO SUSP: 30.0000 mL | Freq: Every day | ORAL | Status: DC | PRN

## 2023-11-06 NOTE — Assessment & Plan Note (Signed)
-   We will continue Synthroid. 

## 2023-11-06 NOTE — ED Notes (Signed)
This RN and security were called to the room to talk to patient. Pt has consistently been yelling at staff, has been on the call bell, and has been calling 911 telling them that we are holding her hostage. Pt is upset that she was in the lobby for so long yesterday, this RN explained to her that we were very busy yesterday and that a lot of people had to wait and this was not done to her intentionally. Pt continues to yell at staff and states that we are trying to kill her. This RN spoke with Roland Earl, Whom pt called. This RN explained the situation to him, family member was very understanding and apologized for pts behavior and stated that she acts this way every time she comes to the hospital.   Pt informed that she was being moved to CPOD holding area. Pt upset that we were moving her. RN explained that we would be moving her to an area with a more comfortable bed. Pt informed this RN that she did not believe anything I told her because I left her sitting out in the lobby all day yesterday. [facility] EDT moved pt to cpod.

## 2023-11-06 NOTE — Assessment & Plan Note (Signed)
-   We will continue anti-hypertensive therapy. 

## 2023-11-06 NOTE — ED Notes (Signed)
Patient set up with breakfast tray 

## 2023-11-06 NOTE — Progress Notes (Signed)
PHARMACY NOTE:  ANTIMICROBIAL RENAL DOSAGE ADJUSTMENT  Current antimicrobial regimen includes a mismatch between antimicrobial dosage and estimated renal function.  As per policy approved by the Pharmacy & Therapeutics and Medical Executive Committees, the antimicrobial dosage will be adjusted accordingly.  Current antimicrobial dosage:  Zosyn 3.375 gm IV Q6H over 30 min   Indication: cellulitis   Renal Function:  Estimated Creatinine Clearance: 49.4 mL/min (by C-G formula based on SCr of 0.59 mg/dL). []      On intermittent HD, scheduled: []      On CRRT    Antimicrobial dosage has been changed to:  Zosyn 3.375 gm IV Q8H EI  Additional comments:   Thank you for allowing pharmacy to be a part of this patient's care.  Lior Cartelli D, [facility] 11/06/2023 5:58 AM

## 2023-11-06 NOTE — ED Notes (Signed)
This RN received call from 911 stating patient is calling them stating she is being held against her will and being kept in the basement. Charge RN made aware

## 2023-11-06 NOTE — Progress Notes (Signed)
Pt is screaming and cussing staffs. Pt pulled IV out and told Nurses she is not going to take any antibiotic and fentanyl through her IV. She only wants percocet and will not need antibiotic for her foot. Pt is inconsolable and still screaming. This Thereasa Parkin is in the room and tried to deescalate situation. Called security who's making rounds to go to patient room. Pt is screaming to the security that she's going to call the Police on [place] because her DNA is all over the hospital. Pt is still refusing to place an IV on her and that she does not want IV antibiotic.

## 2023-11-06 NOTE — ED Notes (Signed)
Patient using hospital phone at this time

## 2023-11-06 NOTE — Assessment & Plan Note (Signed)
We will continue Xanax and Risperdal.

## 2023-11-06 NOTE — ED Notes (Signed)
Patient ringing out on call bell immediately after staff left her room. MD into patients room to address needs

## 2023-11-06 NOTE — Assessment & Plan Note (Signed)
-   The patient will be admitted to a medically monitored bed. - We will place the patient IV steroid therapy with IV Solu-Medrol as well as nebulized bronchodilator therapy with duonebs q.i.d. and q.4 hours p.r.n.Marland Kitchen - Mucolytic therapy will be provided with Mucinex and antibiotic therapy with IV Rocephin. - O2 protocol will be followed.

## 2023-11-06 NOTE — ED Notes (Signed)
While cleaning up patients BM, she is repeatedly begging staff to except money from her. She was made aware we do not except money from patients. Patient verbally aggressive saying "I am not giving you enough?? How about you take $100.00 from me." Patient told again we do not take money from patients. Patient repeatedly saying "why are you mad at me! Do not leave me!!"

## 2023-11-06 NOTE — ED Notes (Signed)
Patient calling out for IV beeping. Patient asking for pain medication. Patient made aware of the next time she can have scheduled pain medications

## 2023-11-06 NOTE — ED Notes (Addendum)
Patient given phone to call grandson and order food

## 2023-11-06 NOTE — Progress Notes (Signed)
Pt refused IV at this time. Unit RN at bedside and aware. Advised to place another consult at such that pt is agreeable to having PIV placed

## 2023-11-06 NOTE — Progress Notes (Signed)
PROGRESS NOTE    [name]  WUJ:[id number] DOB: October 23, 1946 DOA: 11/05/2023 PCP: [name], MD    Brief Narrative:   [name] is a 77 y.o. female with past medical history significant for CAD, COPD/chronic hypoxic respiratory failure on 3 L nasal cannula baseline, HTN, HLD, hypothyroidism, peripheral vascular disease, anxiety/depression, GERD, esophageal spasm who presented to [facility] ED on 12/16 via EMS from home after being seen by home health nurse with concern of right foot infection.  Home health RN found patient sitting in her feces and urine all night.  Apparently her right great toe has been looking significantly worse with swelling, erythema and drainage over the last few weeks.  Recently discharged from [facility].  Patient also endorses worsening cough, wheezing and dyspnea over the last several days as well.  She admitted to chills but denies fever.  No nausea/vomiting or abdominal pain, no urinary symptoms.  In the ED, temperature 98.0 F, HR 132, RR 23, BP 92/58, SpO2 97% on 3 L nasal cannula.  WBC 11.2, hemoglobin 9.0, platelet count 611.  Sodium 139, potassium 3.5, chloride 103, CO2 26, glucose 103, BUN 11, creatinine 0.59.  AST 16, ALT 12, total bilirubin 0.3.  High sensitive troponin 5.  Lactic acid 1.6.  Chest x-ray with increased interstitial markings/GGO right lower lung field.  Right foot x-ray with soft tissue swelling about the forefoot and first toe, no evidence of osteomyelitis.  Patient was given 2 DuoNebs, 125 mg of IV Solu-Medrol, 4 mg of IV morphine sulfate, 1 mg of p.o. Xanax, 1 L bolus of IV normal saline as well as IV vancomycin and Zosyn. TRH consulted for admission for further evaluation and management.   Assessment & Plan:   Right foot cellulitis Gangrenous changes right great toe Patient presenting from home with progressive swelling, erythema, discoloration and drainage from right great toe.  History of peripheral vascular disease; follows  with vascular surgery, Dr. [name] outpatient and podiatry Dr. Allena Katz.  Patient afebrile with slightly elevated WBC count of 11.2.  Right foot x-ray with soft tissue swelling about the forefoot/first toe, no evidence of osteomyelitis. -- Podiatry following, appreciate assistance -- Vascular surgery consulted -- Superficial wound culture: Few GPC's on Gram stain, further pending -- Blood cultures x 2: Pending -- Vascular ultrasound ABI: Pending -- Vancomycin, pharmacy consulted for dosing/monitoring -- Zosyn 3.375 g IV every 8 hours  Acute COPD exacerbation Chronic hypoxic respiratory failure Patient reporting increasing shortness of breath, wheezing and nonproductive cough.  Chest x-ray with increased interstitial markings/GGO right lower lung fields. -- Solu-Medrol 40 mg IV every 12 hours x 1 day followed by prednisone 40 mg p.o. daily -- DuoNeb 4 times daily -- Breo Ellipta 1 puff daily substituted for home Advair -- On IV antibiotics as above -- Mucinex 600 mg p.o. twice daily -- Tussionex every 12 hours as needed cough -- Continue supplemental oxygen, maintain SpO2 greater than 88%; baseline 3L [place]  Essential hypertension -- Metoprolol tartrate 25 mg p.o. twice daily -- Furosemide 20 mg p.o. daily  Hyperlipidemia Currently not on statin outpatient. -- Check lipid panel  Hypothyroidism -- Levothyroxine 50 mcg p.o. daily  Iron deficiency anemia Hemoglobin 9.5, stable -- Iron polysaccharide 650 mg p.o. daily  Anxiety/depression -- Alprazolam 1 mg p.o. 3 times daily as needed anxiety -- Risperdal 0.25 m p.o. nightly  GERD -- Protonix 40 mg p.o. daily  Insomnia -- Trazodone 300 mg p.o. nightly   DVT prophylaxis: enoxaparin (LOVENOX) injection  40 mg Start: 11/06/23 0800    Code Status: Full Code Family Communication:   Disposition Plan:  Level of care: Telemetry Medical Status is: Inpatient Remains inpatient appropriate because: IV antibiotics    Consultants:   Podiatry Vascular surgery  Procedures:  Vascular ultrasound ABI: Pending  Antimicrobials:  Vancomycin 12/16>> Zosyn 12/16>>   Subjective: Patient seen examined bedside, resting calmly.  Remains in ED holding area.  RN present at bedside.  Complaining of pain to dorsal surface of right foot and great toe.  Reports shortness of breath improved.  Discussed that she may end up losing her right great toe due to concern for gangrenous changes.  Also noted erythema surrounding forefoot.  Continues on IV antibiotics.  Anxious, otherwise no other specific complaints, concerns or questions this morning.  Discussed with podiatry who requested vascular surgery evaluation, consult placed.  Patient denies headache, no visual changes, no chest pain, no palpitations, no fever/chills/night sweats, no nausea/vomiting/diarrhea, no abdominal pain, no focal weakness, no fatigue, no paresthesia.  No acute events overnight per nurse staff.  Objective: Vitals:   11/06/23 0846 11/06/23 0900 11/06/23 0930 11/06/23 1030  BP:  (!) 156/84 (!) 166/87 (!) 166/95  Pulse:    99  Resp:   13 (!) 22  Temp: 98.5 F (36.9 C)     TempSrc: Oral     SpO2:   100% 100%  Weight:       No intake or output data in the 24 hours ending 11/06/23 1059 Filed Weights   11/06/23 0329  Weight: 52.3 kg    Examination:  Physical Exam: GEN: NAD, alert and oriented x 3, chronically ill/elderly in appearance, appears older than stated age HEENT: NCAT, PERRL, EOMI, sclera clear, MMM PULM: Breath sounds diminished bilateral bases, + mid-to-late expiratory wheezes throughout, no crackles, normal respiratory effort without accessory muscle use, on 3 L nasal cannula which is her baseline CV: RRR w/o M/G/R GI: abd soft, NTND, + BS MSK: no peripheral edema, moves all extremities independently, noted gangrenous changes to right great toe with ulceration/discharge dorsal surface, dorsal foot with faint erythema NEURO: CN II-XII intact, no  focal deficits, sensation to light touch intact PSYCH: Anxious mood Integumentary: Noted dry flaky skin, right foot as described above and depicted below, otherwise no other concerning rashes/lesions/wounds noted on exposed skin surfaces,     Data Reviewed: I have personally reviewed following labs and imaging studies  CBC: Recent Labs  Lab 11/05/23 1322 11/06/23 0603  WBC 11.2* 10.1  NEUTROABS 8.5*  --   HGB 9.0* 9.5*  HCT 29.9* 30.6*  MCV 92.6 91.1  PLT 611* 521*   Basic Metabolic Panel: Recent Labs  Lab 11/05/23 1322 11/06/23 0603  NA 139 138  K 3.5 3.8  CL 103 104  CO2 26 26  GLUCOSE 103* 178*  BUN 11 13  CREATININE 0.59 0.68  CALCIUM 8.5* 8.3*   GFR: Estimated Creatinine Clearance: 49.4 mL/min (by C-G formula based on SCr of 0.68 mg/dL). Liver Function Tests: Recent Labs  Lab 11/05/23 1322  AST 16  ALT 12  ALKPHOS 77  BILITOT 0.3  PROT 6.5  ALBUMIN 2.8*   No results for input(s): "LIPASE", "AMYLASE" in the last 168 hours. No results for input(s): "AMMONIA" in the last 168 hours. Coagulation Profile: No results for input(s): "INR", "PROTIME" in the last 168 hours. Cardiac Enzymes: No results for input(s): "CKTOTAL", "CKMB", "CKMBINDEX", "TROPONINI" in the last 168 hours. BNP (last 3 results) No results for input(s): "PROBNP"  in the last 8760 hours. HbA1C: No results for input(s): "HGBA1C" in the last 72 hours. CBG: No results for input(s): "GLUCAP" in the last 168 hours. Lipid Profile: No results for input(s): "CHOL", "HDL", "LDLCALC", "TRIG", "CHOLHDL", "LDLDIRECT" in the last 72 hours. Thyroid Function Tests: No results for input(s): "TSH", "T4TOTAL", "FREET4", "T3FREE", "THYROIDAB" in the last 72 hours. Anemia Panel: No results for input(s): "VITAMINB12", "FOLATE", "FERRITIN", "TIBC", "IRON", "RETICCTPCT" in the last 72 hours. Sepsis Labs: Recent Labs  Lab 11/05/23 1322  LATICACIDVEN 1.6    Recent Results (from the past 240 hours)   Culture, blood (routine x 2)     Status: None (Preliminary result)   Collection Time: 11/06/23 12:15 AM   Specimen: BLOOD  Result Value Ref Range Status   Specimen Description BLOOD RIGHT ARM  Final   Special Requests   Final    BOTTLES DRAWN AEROBIC AND ANAEROBIC Blood Culture adequate volume   Culture   Final    NO GROWTH < 12 HOURS Performed at [facility], [address] [place], [place] [address]    Report Status PENDING  Incomplete  Culture, blood (routine x 2)     Status: None (Preliminary result)   Collection Time: 11/06/23 12:17 AM   Specimen: BLOOD  Result Value Ref Range Status   Specimen Description BLOOD LEFT ARM  Final   Special Requests   Final    BOTTLES DRAWN AEROBIC AND ANAEROBIC Blood Culture adequate volume   Culture   Final    NO GROWTH < 12 HOURS Performed at [facility], [address] [place], [place] [address]    Report Status PENDING  Incomplete  Aerobic/Anaerobic Culture w Gram Stain (surgical/deep wound)     Status: None (Preliminary result)   Collection Time: 11/06/23  1:43 AM   Specimen: Toe  Result Value Ref Range Status   Specimen Description   Final    TOE Performed at [facility], [address], [place], [place] [address]    Special Requests   Final    NONE Performed at [facility], [address], [place], [place] [address]    Gram Stain   Final    NO WBC SEEN FEW GRAM POSITIVE COCCI Performed at [facility], [address] [place], [place] [address]    Culture PENDING  Incomplete   Report Status PENDING  Incomplete         Radiology Studies: DG Foot 2 Views Right Result Date: 11/06/2023 CLINICAL DATA:  Wound on right foot. 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 Demineralization. No evidence of acute fracture. No dislocation. No evidence of osteomyelitis. Soft tissue swelling about the forefoot and first  toe. IMPRESSION: Soft tissue swelling about the forefoot and first toe. No evidence of osteomyelitis. Electronically Signed   By: Minerva Fester M.D.   On: 11/06/2023 02:06   DG Chest Port 1 View Result Date: 11/06/2023 CLINICAL DATA:  Shortness of breath EXAM: PORTABLE CHEST 1 VIEW COMPARISON:  10/01/2023 FINDINGS: Stable cardiomediastinal silhouette. Aortic atherosclerotic calcification. Hyperinflation and chronic bronchitic change. Increased interstitial markings and ground-glass opacities in the right lower lung compared to 10/01/2023. This may be projectional or due to infection superimposed on emphysema. No pleural effusion or pneumothorax. Cervical spine fusion. IMPRESSION: Increased interstitial markings and ground-glass opacities in the right lower lung compared to 10/01/2023. This may be due to confluence of shadows or infection  superimposed on emphysema. Electronically Signed   By: Minerva Fester M.D.   On: 11/06/2023 02:04        Scheduled Meds:  enoxaparin (LOVENOX) injection  40 mg Subcutaneous Q24H   feeding supplement  237 mL Oral TID BM   furosemide  20 mg Oral Daily   guaiFENesin  600 mg Oral BID   ipratropium-albuterol  3 mL Nebulization QID   iron polysaccharides  150 mg Oral Daily   levothyroxine  50 mcg Oral Q0600   methylPREDNISolone (SOLU-MEDROL) injection  40 mg Intravenous Q12H   Followed by   Melene Muller ON 11/07/2023] predniSONE  40 mg Oral Q breakfast   metoprolol tartrate  25 mg Oral BID   multivitamin with minerals  1 tablet Oral Daily   pantoprazole  40 mg Oral Daily   potassium chloride SA  20 mEq Oral Daily   risperiDONE  0.25 mg Oral QHS   traZODone  300 mg Oral QHS   Continuous Infusions:  sodium chloride 75 mL/hr at 11/06/23 0324   piperacillin-tazobactam (ZOSYN)  IV Stopped (11/06/23 1034)   [START ON 11/07/2023] vancomycin       LOS: 0 days    Time spent: 56 minutes spent on chart review, discussion with nursing staff, consultants, updating  family and interview/physical exam; more than 50% of that time was spent in counseling and/or coordination of care.    Alvira Philips [place], DO [facility] Available via Epic secure chat 7am-7pm After these hours, please refer to coverage provider listed on [email or web address] 11/06/2023, 10:59 AM

## 2023-11-06 NOTE — Consult Note (Signed)
Hospital Consult    Reason for Consult:  Right Foot Infection Requesting Physician:  Eric U[place] DO  MRN #:  [id number]  History of Present Illness: This is a 77 y.o. female with past medical history significant for CAD, COPD/chronic hypoxic respiratory failure on 3 L nasal cannula baseline, HTN, HLD, hypothyroidism, peripheral vascular disease, anxiety/depression, GERD, esophageal spasm who presented to [facility] ED on 12/16 via EMS from home after being seen by home health nurse with concern of right foot infection.  Patient endorses her right great toe has been looking significantly worse with swelling, erythema and drainage over the last few weeks. Recently discharged from [facility].   Past Medical History:  Diagnosis Date   Anxiety    CAD (coronary artery disease)    Carpal tunnel syndrome    Cataract    Complication of anesthesia    has woken up at times   COPD (chronic obstructive pulmonary disease) (HCC)    CRP elevated 09/14/2015   Depression    Difficult intubation    Dyslipidemia    Dyspnea    DOE   Dysrhythmia    hx palpatations   Elevated sedimentation rate 09/14/2015   Esophageal spasm    Gastrointestinal parasites    GERD (gastroesophageal reflux disease)    HCAP (healthcare-associated pneumonia) 10/22/2018   Hiatal hernia    History of peptic ulcer disease    Hypertension    Hyperthyroidism    Hypothyroidism    Low magnesium levels 09/14/2015   Nicotine dependence 09/14/2015   Pelvic fracture (HCC) 2008   fall from riding a horse   Peripheral vascular disease (HCC)    Reflux    Rotator cuff injury    Sepsis (HCC) 10/22/2018   Stenosis, spinal, lumbar     Past Surgical History:  Procedure Laterality Date   AMPUTATION TOE Right 12/26/2020   Procedure: AMPUTATION TOE-3rd Toes;  Surgeon: Gwyneth Revels, DPM;  Location: [facility];  Service: Podiatry;  Laterality: Right;   AMPUTATION TOE Right 04/22/2021   Procedure: AMPUTATION TOE- RIGHT 2ND;   Surgeon: Gwyneth Revels, DPM;  Location: [facility];  Service: Podiatry;  Laterality: Right;   AMPUTATION TOE Right 10/21/2021   Procedure: AMPUTATION TOE;  Surgeon: Rosetta Posner, DPM;  Location: [facility];  Service: Podiatry;  Laterality: Right;   AMPUTATION TOE Right 11/23/2021   Procedure: AMPUTATION TOE;  Surgeon: Linus Galas, DPM;  Location: [facility];  Service: Podiatry;  Laterality: Right;   AMPUTATION TOE Right 07/05/2022   Procedure: AMPUTATION RIGHT 5TH TOE;  Surgeon: Edwin Cap, DPM;  Location: [facility];  Service: Podiatry;  Laterality: Right;   APPENDECTOMY     BACK SURGERY     CERVICAL FUSION   CARPAL TUNNEL RELEASE     CATARACT EXTRACTION W/PHACO Right 08/07/2017   Procedure: CATARACT EXTRACTION PHACO AND INTRAOCULAR LENS PLACEMENT (IOC);  Surgeon: Galen Manila, MD;  Location: [facility];  Service: Ophthalmology;  Laterality: Right;  [place] 00:52.0 AP% 16.8 CDE 8.74 Fluid Pack Lot # [id number]   CATARACT EXTRACTION W/PHACO Left 09/04/2017   Procedure: CATARACT EXTRACTION PHACO AND INTRAOCULAR LENS PLACEMENT (IOC);  Surgeon: Galen Manila, MD;  Location: [facility];  Service: Ophthalmology;  Laterality: Left;  [place] 00:34 AP% 17.0 CDE 5.80 Fluid pack lot # [id number]   CESAREAN SECTION     CHOLECYSTECTOMY     FRACTURE SURGERY     HIP SURGERY     INTRAMEDULLARY (IM) NAIL INTERTROCHANTERIC Right 10/01/2016   Procedure: INTRAMEDULLARY (IM) NAIL INTERTROCHANTRIC;  Surgeon: Christena Flake, MD;  Location: [facility];  Service: Orthopedics;  Laterality: Right;   INTRAMEDULLARY (IM) NAIL INTERTROCHANTERIC Left 01/14/2021   Procedure: INTRAMEDULLARY (IM) NAIL INTERTROCHANTRIC;  Surgeon: Lyndle Herrlich, MD;  Location: [facility];  Service: Orthopedics;  Laterality: Left;   KYPHOPLASTY N/A 08/20/2018   Procedure: [id number];  Surgeon: Kennedy Bucker, MD;  Location: [facility];  Service: Orthopedics;  Laterality: N/A;   LOWER EXTREMITY ANGIOGRAPHY Right 12/18/2019   Procedure: LOWER EXTREMITY  ANGIOGRAPHY;  Surgeon: Annice Needy, MD;  Location: [facility];  Service: Cardiovascular;  Laterality: Right;   LOWER EXTREMITY ANGIOGRAPHY Right 12/02/2020   Procedure: LOWER EXTREMITY ANGIOGRAPHY;  Surgeon: Annice Needy, MD;  Location: [facility];  Service: Cardiovascular;  Laterality: Right;   LOWER EXTREMITY ANGIOGRAPHY Right 12/23/2020   Procedure: Lower Extremity Angiography;  Surgeon: Annice Needy, MD;  Location: [facility];  Service: Cardiovascular;  Laterality: Right;   LOWER EXTREMITY ANGIOGRAPHY Right 12/24/2020   Procedure: Lower Extremity Angiography;  Surgeon: Annice Needy, MD;  Location: [facility];  Service: Cardiovascular;  Laterality: Right;   LOWER EXTREMITY ANGIOGRAPHY Right 10/20/2021   Procedure: Lower Extremity Angiography;  Surgeon: Annice Needy, MD;  Location: [facility];  Service: Cardiovascular;  Laterality: Right;   LOWER EXTREMITY ANGIOGRAPHY Right 07/03/2022   Procedure: Lower Extremity Angiography;  Surgeon: Annice Needy, MD;  Location: [facility];  Service: Cardiovascular;  Laterality: Right;   NECK SURGERY     NOSE SURGERY     ROTATOR CUFF REPAIR     x2   SHOULDER ARTHROSCOPY  12/07/2011   Procedure: ARTHROSCOPY SHOULDER;  Surgeon: Loreta Ave, MD;  Location: [facility];  Service: Orthopedics;  Laterality: Right;  Debridement Partial Cuff Tear, Release Coracoacromial Ligament   SHOULDER SURGERY  12/07/2011   right    No Known Allergies  Prior to Admission medications   Medication Sig Start Date End Date Taking? Authorizing Provider  albuterol (VENTOLIN HFA) 108 (90 Base) MCG/ACT inhaler Inhale 1-2 puffs into the lungs every 4 (four) hours as needed for shortness of breath or wheezing. 06/28/22  Yes [provider]  ALPRAZolam Prudy Feeler) 1 MG tablet Take 1 tablet (1 mg total) by mouth 3 (three) times daily as needed. SNF use only 10/08/23  Yes Sreenath, Sudheer B, MD  ASPIRIN LOW DOSE  81 MG tablet TAKE 1 TABLET BY MOUTH EVERY DAY 05/07/23  Yes Georgiana Spinner, NP  COMBIVENT RESPIMAT 20-100 MCG/ACT AERS respimat Inhale 1-2 puffs into the lungs in the morning, at noon, in the evening, and at bedtime. 09/03/23  Yes [provider]  fluticasone-salmeterol (ADVAIR) 250-50 MCG/ACT AEPB Inhale 1 puff into the lungs 2 (two) times daily as needed.   Yes [provider]  furosemide (LASIX) 20 MG tablet Take 20 mg by mouth daily. 07/20/23  Yes [provider]  hydrOXYzine (ATARAX) 25 MG tablet Take 1 tablet (25 mg total) by mouth 3 (three) times daily as needed for anxiety. 03/02/23  Yes Gillis Santa, MD  ipratropium-albuterol (DUONEB) 0.5-2.5 (3) MG/3ML SOLN Take 3 mLs by nebulization every 6 (six) hours as needed.   Yes [provider]  levothyroxine (SYNTHROID) 50 MCG tablet Take 50 mcg by mouth every morning.   Yes [provider]  omeprazole (PRILOSEC) 40 MG capsule Take 40 mg by mouth daily.   Yes [provider]  oxyCODONE-acetaminophen (PERCOCET/ROXICET) 5-325 MG tablet Take  1 tablet by mouth every 4 (four) hours as needed. 10/22/23  Yes [provider]  potassium chloride SA (KLOR-CON M) 20 MEQ tablet Take 20 mEq by mouth daily.   Yes [provider]  traZODone (DESYREL) 150 MG tablet Take 300 mg by mouth at bedtime.   Yes [provider]  amLODipine (NORVASC) 5 MG tablet Take 1 tablet (5 mg total) by mouth daily. Patient not taking: Reported on 10/06/2023 05/10/23   Leeroy Bock, MD  ammonium lactate (LAC-HYDRIN) 12 % lotion APPLY TO AFFECTED AREA AS NEEDED FOR DRY SKIN 08/09/23   Candelaria Stagers, DPM  atorvastatin (LIPITOR) 10 MG tablet Take 1 tablet (10 mg total) by mouth daily. Patient not taking: Reported on 05/06/2023 11/10/22 11/10/23  Arnetha Courser, MD  benzonatate (TESSALON) 100 MG capsule Take 1 capsule (100 mg total) by mouth 3 (three) times daily as needed for cough. 03/02/23   Gillis Santa,  MD  feeding supplement (ENSURE ENLIVE / ENSURE PLUS) LIQD Take 237 mLs by mouth 3 (three) times daily between meals. 02/03/23   Darlin Priestly, MD  iron polysaccharides (NIFEREX) 150 MG capsule Take 1 capsule (150 mg total) by mouth daily. 03/03/23 06/01/23  Gillis Santa, MD  melatonin 5 MG TABS Take 1 tablet (5 mg total) by mouth at bedtime. 03/02/23 03/01/24  Gillis Santa, MD  metoprolol tartrate (LOPRESSOR) 25 MG tablet Take 1 tablet (25 mg total) by mouth 2 (two) times daily. Skip the dose if systolic BP less than 130 mmHg and/or heart rate less than 65 beats per minute 03/02/23 10/06/23  Gillis Santa, MD  Multiple Vitamin (MULTIVITAMIN WITH MINERALS) TABS tablet Take 1 tablet by mouth daily. 11/10/22   Arnetha Courser, MD  polyethylene glycol (MIRALAX / GLYCOLAX) 17 g packet Take 17 g by mouth daily as needed. 05/16/23   Tresa Moore, MD  risperiDONE (RISPERDAL) 0.25 MG tablet Take 1 tablet (0.25 mg total) by mouth at bedtime. 03/02/23 10/06/23  Gillis Santa, MD  thiamine (VITAMIN B-1) 100 MG tablet Take 1 tablet (100 mg total) by mouth daily. Patient not taking: Reported on 10/06/2023 11/10/22   Arnetha Courser, MD    Social History   Socioeconomic History   Marital status: Divorced    Spouse name: Not on file   Number of children: Not on file   Years of education: Not on file   Highest education level: Not on file  Occupational History   Occupation: retired Arboriculturist  Tobacco Use   Smoking status: Some Days    Types: Cigarettes   Smokeless tobacco: Never   Tobacco comments:    pt states 2 cigs per month   Substance and Sexual Activity   Alcohol use: No   Drug use: No   Sexual activity: Never  Other Topics Concern   Not on file  Social History Narrative   Not on file   Social Drivers of Health   Financial Resource Strain: Not on file  Food Insecurity: No Food Insecurity (10/02/2023)   Hunger Vital Sign    Worried About Running Out of Food in the Last Year: Never true     Ran Out of Food in the Last Year: Never true  Transportation Needs: No Transportation Needs (10/02/2023)   PRAPARE - Administrator, Civil Service (Medical): No    Lack of Transportation (Non-Medical): No  Physical Activity: Not on file  Stress: Not on file  Social Connections: Not on file  Intimate Partner Violence:  Not At Risk (10/02/2023)   Humiliation, Afraid, Rape, and Kick questionnaire    Fear of Current or Ex-Partner: No    Emotionally Abused: No    Physically Abused: No    Sexually Abused: No     Family History  Problem Relation Age of Onset   Alcoholism Brother    Aneurysm Other     ROS: Otherwise negative unless mentioned in HPI  Physical Examination  Vitals:   11/06/23 0930 11/06/23 1030  BP: (!) 166/87 (!) 166/95  Pulse:  99  Resp: 13 (!) 22  Temp:    SpO2: 100% 100%   Body mass index is 19.81 kg/m.  General:  WDWN in NAD Gait: Not observed HENT: WNL, normocephalic Pulmonary: Diminished bilateral bases, normal non-labored breathing, without Rales, rhonchi,  wheezing Cardiac: regular, without  Murmurs, rubs or gallops; without carotid bruits Abdomen: Positive bowel sounds throughout, soft, NT/ND, no masses Skin: without rashes Vascular Exam/Pulses: Unable to palpate DP/PT pulses in bilateral lower extremities. Right foot 10/10 pain to touch. Very limited refill.  Extremities: with ischemic changes, with Gangrene , with cellulitis; with open wounds;  Musculoskeletal: no muscle wasting or atrophy  Neurologic: A&O X 3;  No focal weakness or paresthesias are detected; speech is fluent/normal Psychiatric:  The pt has Normal affect. Patient is very Hard of Hearing. Lymph:  Unremarkable  CBC    Component Value Date/Time   WBC 10.1 11/06/2023 0603   RBC 3.36 (L) 11/06/2023 0603   HGB 9.5 (L) 11/06/2023 0603   HCT 30.6 (L) 11/06/2023 0603   PLT 521 (H) 11/06/2023 0603   MCV 91.1 11/06/2023 0603   MCH 28.3 11/06/2023 0603   MCHC 31.0  11/06/2023 0603   RDW 14.9 11/06/2023 0603   LYMPHSABS 1.6 11/05/2023 1322   MONOABS 0.7 11/05/2023 1322   EOSABS 0.2 11/05/2023 1322   BASOSABS 0.1 11/05/2023 1322    BMET    Component Value Date/Time   NA 138 11/06/2023 0603   NA 143 10/06/2019 1552   NA 138 02/10/2014 1335   K 3.8 11/06/2023 0603   K 4.5 02/10/2014 1335   CL 104 11/06/2023 0603   CL 106 02/10/2014 1335   CO2 26 11/06/2023 0603   CO2 28 02/10/2014 1335   GLUCOSE 178 (H) 11/06/2023 0603   GLUCOSE 99 02/10/2014 1335   BUN 13 11/06/2023 0603   BUN 7 (L) 10/06/2019 1552   BUN 6 (L) 02/10/2014 1335   CREATININE 0.68 11/06/2023 0603   CREATININE 0.89 02/10/2014 1335   CALCIUM 8.3 (L) 11/06/2023 0603   CALCIUM 8.7 02/10/2014 1335   GFRNONAA >60 11/06/2023 0603   GFRNONAA >60 02/10/2014 1335   GFRAA >60 12/18/2019 1012   GFRAA >60 02/10/2014 1335    COAGS: Lab Results  Component Value Date   INR 1.0 07/05/2023   INR 1.0 11/08/2022   INR 1.1 06/30/2022     Non-Invasive Vascular Imaging:    [facility]  Percutaneous Study/Intervention Procedural Note     Date of Surgery: 07/03/2022   Surgeon(s):DEW,JASON     Assistants:none   Pre-operative Diagnosis: PAD with gangrenous changes right foot and rest pain   Post-operative diagnosis:  Same   Procedure(s) Performed:             1.  Ultrasound guidance for vascular access left femoral artery             2.  Catheter placement into right common femoral artery from left femoral approach  3.  Aortogram and selective right lower extremity angiogram             4.  Percutaneous transluminal angioplasty of the right anterior tibial artery with 2.5 mm diameter by 30 cm length angioplasty balloon and 3 mm diameter by 15 cm length angioplasty balloon             5.  Mechanical thrombectomy using the penumbra CAT 6 device to the right SFA, popliteal, and anterior tibial arteries             6.  Percutaneous transluminal  angioplasty of the origin of the right SFA with 4 mm diameter by 6 cm length Lutonix drug-coated angioplasty balloon             7.  StarClose closure device left femoral artery   EBL: 150 cc   Contrast: 50 cc   Fluoro Time: 14.6 minutes   Moderate Conscious Sedation Time: approximately 59 minutes using 1 mg of Versed and 50 mcg of Fentanyl              Indications:  Patient is a 77 y.o.female with gangrenous changes to multiple toes on the right foot with mild surrounding erythema and rest pain of the right foot and a long history of PAD with multiple previous interventions. The patient is brought in for angiography for further evaluation and potential treatment.  Due to the limb threatening nature of the situation, angiogram was performed for attempted limb salvage. The patient is aware that if the procedure fails, amputation would be expected.  The patient also understands that even with successful revascularization, amputation may still be required due to the severity of the situation.  Risks and benefits are discussed and informed consent is obtained.    Procedure:  The patient was identified and appropriate procedural time out was performed.  The patient was then placed supine on the table and prepped and draped in the usual sterile fashion. Moderate conscious sedation was administered during a face to face encounter with the patient throughout the procedure with my supervision of the RN administering medicines and monitoring the patient's vital signs, pulse oximetry, telemetry and mental status throughout from the start of the procedure until the patient was taken to the recovery room. Ultrasound was used to evaluate the left common femoral artery.  It was patent .  A digital ultrasound image was acquired.  A Seldinger needle was used to access the left common femoral artery under direct ultrasound guidance and a permanent image was performed.  A 0.035 J wire was advanced without resistance and a  5Fr sheath was placed.  Pigtail catheter was placed into the aorta and an AP aortogram was performed. This demonstrated normal renal arteries and normal aorta and iliac segments without significant stenosis. I then crossed the aortic bifurcation and advanced to the right femoral head. Selective right lower extremity angiogram was then performed. This demonstrated that the right common femoral artery and profunda femoris artery are small but patent.  There is a near flush occlusion of the right SFA just above the previously placed stents with occlusion throughout the SFA popliteal and the proximal portion of all 3 tibial vessels.  The stents are down into the anterior tibial artery and they remain occluded.  There is reconstitution of the mid to distal anterior tibial artery well below the stents.  The posterior tibial artery also reconstitutes in the midsegment through collaterals and helps provide flow to the foot.. It was felt  that it was in the patient's best interest to proceed with intervention after these images to avoid a second procedure and a larger amount of contrast and fluoroscopy based off of the findings from the initial angiogram. The patient was systemically heparinized and a 6 [place] [employer] sheath was then placed over the [employer] wire. I then used a Kumpe catheter and the advantage wire to navigate into the occluded SFA and popliteal as well as anterior tibial stents and then exchanged for a Nava cross catheter and a V18 wire were I was able to cross into the anterior tibial artery and confirm intraluminal flow distally.  The anterior tibial was occluded and provided runoff of 2.5 mm diameter by 30 cm length angioplasty balloon was inflated from the anterior tibial artery at the ankle up to the previously placed stent in the anterior tibial artery.  He was taken to 8 atm for 1 minute.  Mechanical thrombectomy was then performed with about a half dozen passes with the penumbra CAT 6  device throughout the right SFA, popliteal artery, and anterior tibial artery down to the proximal to mid segment.  Several large chunks of thrombus were removed and following this, the stented area was actually patent with less than 20% residual stenosis.  Just above the previously placed stent in the most proximal SFA was about a 65 to 70% stenosis.  The anterior tibial artery still had a greater than 60% stenosis in the proximal to mid segment even after previous ballooning.  The anterior tibial artery was treated with a 3 mm diameter by 15 cm length angioplasty balloon inflated to 6 atm for 1 minute.  The proximal SFA was treated with a 4 mm diameter by 6 cm length Lutonix drug-coated angioplasty balloon inflated to 8 atm for 1 minute.  Intra-arterial nitroglycerin was given for vasospasm.  Completion imaging showed only about a 20% residual stenosis in the proximal SFA and about a 25% residual stenosis in the anterior tibial artery although significant vasospasm was seen distally, there is now flow into the foot. I elected to terminate the procedure. The sheath was removed and StarClose closure device was deployed in the left femoral artery with excellent hemostatic result. The patient was taken to the recovery room in stable condition having tolerated the procedure well.   Findings:               Aortogram:  This demonstrated normal renal arteries and normal aorta and iliac segments without significant stenosis.             Right lower Extremity: Right common femoral artery and profunda femoris artery are small but patent.  There is a near flush occlusion of the right SFA just above the previously placed stents with occlusion throughout the SFA popliteal and the proximal portion of all 3 tibial vessels.  The stents are down into the anterior tibial artery and they remain occluded.  There is reconstitution of the mid to distal anterior tibial artery well below the stents.  The posterior tibial artery also  reconstitutes in the midsegment through collaterals and helps provide flow to the foot.     Disposition: Patient was taken to the recovery room in stable condition having tolerated the procedure well.   Complications: None   Festus Barren 07/03/2022 3:07 PM      Statin:  Yes.   Beta Blocker:  Yes.   Aspirin:  Yes.   ACEI:  No. ARB:  No. CCB use:  Yes Other  antiplatelets/anticoagulants:  No.    ASSESSMENT/PLAN: This is a 77 y.o. female who presents to [facility] emergency department with worsening right foot pain and gangrene of the great toe.  Patient currently being seen by podiatry.  Patient had prior 4 toes on the right foot great toe amputated.  PLAN: Vascular surgery plans on taking the patient to the vascular lab on Thursday, 11/08/2023 for right lower extremity angiogram with possible intervention.  I discussed the procedure in detail with the patient at the bedside.  We also discussed the benefits, risks and complications to the procedure.  She verbalizes her understanding today and wishes to proceed as soon as possible.  I answered all the patient's questions this morning.  Patient will be made n.p.o. after midnight on 11/08/2023 for the procedure.   -I discussed the plan and the case with both Dr. Festus Barren MD and Dr. Elbert Ewings MD and they agree with the plan   [facility] 11/06/2023 12:02 PM

## 2023-11-06 NOTE — Progress Notes (Signed)
Pharmacy Antibiotic Note  [name] is a 77 y.o. female admitted on 11/05/2023 with cellulitis.  Pharmacy has been consulted for Vancomycin dosing.  Plan: Vancomycin 1 gm IV X 1 given in ED on 12/17 @ 0108. Vancomycin 1250 mg IV Q36H ordered to start on 12/18 @ 1300.   AUC = 468.1 Vanc trough = 7.9   Weight: 52.3 kg (115 lb 6.4 oz)  Temp (24hrs), Avg:98.2 F (36.8 C), Min:98 F (36.7 C), Max:98.3 F (36.8 C)  Recent Labs  Lab 11/05/23 1322 11/06/23 0603  WBC 11.2* 10.1  CREATININE 0.59  --   LATICACIDVEN 1.6  --     Estimated Creatinine Clearance: 49.4 mL/min (by C-G formula based on SCr of 0.59 mg/dL).    No Known Allergies  Antimicrobials this admission:   >>    >>   Dose adjustments this admission:   Microbiology results:  BCx:   UCx:    Sputum:    MRSA PCR:   Thank you for allowing pharmacy to be a part of this patient's care.  Oakley Orban D 11/06/2023 6:29 AM

## 2023-11-06 NOTE — H&P (Signed)
[place]    PATIENT NAME: [name]    MR#:  [id number]  DATE OF BIRTH:  26-Kem Parcher-1947  DATE OF ADMISSION:  11/05/2023  PRIMARY CARE PHYSICIAN: [name], MD   Patient is coming from: Home  REQUESTING/REFERRING PHYSICIAN: Chiquita Loth, MD  CHIEF COMPLAINT:   Chief Complaint  Patient presents with   Foot Injury    HISTORY OF PRESENT ILLNESS:  [name] is a 77 y.o. female with medical history significant for coronary artery disease, anxiety, COPD, depression, dyslipidemia, GERD, esophageal spasm, hypertension, thyroid disease and peripheral vascular disease, who presented to the emergency room with acute onset of suspected right big toe wound infection.  Her wound care nurse called EMS as the patient was sitting in her feces and urine all night.  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  She has been followed by vascular surgery for peripheral arterial disease.  Her wound on the right big toe has been looking significantly worse with swelling and erythema and drainage.  She is on home O2 for her COPD at 3 L/min.  No chest pain or palpitations.  She has been having worsening cough with associated wheezing and dyspnea without significant expectoration with her cough.  She admitted to chills without measured fever.  No nausea or vomiting or abdominal pain.  No dysuria, oliguria or hematuria or flank pain.  ED Course: When she came to the ER, BP was 92/58 with pulse oximetry of 97% on 3 L of O2 by nasal cannula otherwise with normal vital signs.  Labs revealed borderline potassium of 3.5 otherwise unremarkable BMP.  LFTs showed albumin of 2.8 and were otherwise unremarkable.  Lactic acid was 1.6 and CBC showed leukocytosis of 11.2 with neutrophilia and anemia slightly worse than baseline.  2 blood cultures were drawn.  Portable chest x-ray showed EKG as reviewed by me : EKG showed sinus tachycardia with rate of 132 with occasional  PVCs. Imaging: Increased interstitial markings and groundglass opacities in the right lower lung compared to 10/01/2023 that may be due to confluence of shadows or infection superimposed on emphysema.  2 view foot x-ray showed soft tissue swelling about the forefoot and the first toe with no evidence for osteomyelitis.  The patient was given 2 DuoNebs, 125 mg of IV Solu-Medrol, 4 mg of IV morphine sulfate, 1 mg of p.o. Xanax, 1 L bolus of IV normal saline as well as IV vancomycin and Zosyn.  She will be admitted to a medical telemetry bed for further evaluation and management. PAST MEDICAL HISTORY:   Past Medical History:  Diagnosis Date   Anxiety    CAD (coronary artery disease)    Carpal tunnel syndrome    Cataract    Complication of anesthesia    has woken up at times   COPD (chronic obstructive pulmonary disease) (HCC)    CRP elevated 09/14/2015   Depression    Difficult intubation    Dyslipidemia    Dyspnea    DOE   Dysrhythmia    hx palpatations   Elevated sedimentation rate 09/14/2015   Esophageal spasm    Gastrointestinal parasites    GERD (gastroesophageal reflux disease)    HCAP (healthcare-associated pneumonia) 10/22/2018   Hiatal hernia    History of peptic ulcer disease    Hypertension    Hyperthyroidism    Hypothyroidism    Low magnesium levels 09/14/2015   Nicotine dependence 09/14/2015  Pelvic fracture (HCC) 2008   fall from riding a horse   Peripheral vascular disease (HCC)    Reflux    Rotator cuff injury    Sepsis (HCC) 10/22/2018   Stenosis, spinal, lumbar     PAST SURGICAL HISTORY:   Past Surgical History:  Procedure Laterality Date   AMPUTATION TOE Right 12/26/2020   Procedure: AMPUTATION TOE-3rd Toes;  Surgeon: Gwyneth Revels, DPM;  Location: [facility];  Service: Podiatry;  Laterality: Right;   AMPUTATION TOE Right 04/22/2021   Procedure: AMPUTATION TOE- RIGHT 2ND;  Surgeon: Gwyneth Revels, DPM;  Location: [facility];  Service: Podiatry;   Laterality: Right;   AMPUTATION TOE Right 10/21/2021   Procedure: AMPUTATION TOE;  Surgeon: Rosetta Posner, DPM;  Location: [facility];  Service: Podiatry;  Laterality: Right;   AMPUTATION TOE Right 11/23/2021   Procedure: AMPUTATION TOE;  Surgeon: Linus Galas, DPM;  Location: [facility];  Service: Podiatry;  Laterality: Right;   AMPUTATION TOE Right 07/05/2022   Procedure: AMPUTATION RIGHT 5TH TOE;  Surgeon: Edwin Cap, DPM;  Location: [facility];  Service: Podiatry;  Laterality: Right;   APPENDECTOMY     BACK SURGERY     CERVICAL FUSION   CARPAL TUNNEL RELEASE     CATARACT EXTRACTION W/PHACO Right 08/07/2017   Procedure: CATARACT EXTRACTION PHACO AND INTRAOCULAR LENS PLACEMENT (IOC);  Surgeon: Galen Manila, MD;  Location: [facility];  Service: Ophthalmology;  Laterality: Right;  [place] 00:52.0 AP% 16.8 CDE 8.74 Fluid Pack Lot # [id number]   CATARACT EXTRACTION W/PHACO Left 09/04/2017   Procedure: CATARACT EXTRACTION PHACO AND INTRAOCULAR LENS PLACEMENT (IOC);  Surgeon: Galen Manila, MD;  Location: [facility];  Service: Ophthalmology;  Laterality: Left;  [place] 00:34 AP% 17.0 CDE 5.80 Fluid pack lot # [id number]   CESAREAN SECTION     CHOLECYSTECTOMY     FRACTURE SURGERY     HIP SURGERY     INTRAMEDULLARY (IM) NAIL INTERTROCHANTERIC Right 10/01/2016   Procedure: INTRAMEDULLARY (IM) NAIL INTERTROCHANTRIC;  Surgeon: Christena Flake, MD;  Location: [facility];  Service: Orthopedics;  Laterality: Right;   INTRAMEDULLARY (IM) NAIL INTERTROCHANTERIC Left 01/14/2021   Procedure: INTRAMEDULLARY (IM) NAIL INTERTROCHANTRIC;  Surgeon: Lyndle Herrlich, MD;  Location: [facility];  Service: Orthopedics;  Laterality: Left;   KYPHOPLASTY N/A 08/20/2018   Procedure: [id number];  Surgeon: Kennedy Bucker, MD;  Location: [facility];  Service: Orthopedics;  Laterality: N/A;   LOWER EXTREMITY ANGIOGRAPHY Right 12/18/2019   Procedure: LOWER EXTREMITY ANGIOGRAPHY;  Surgeon: Annice Needy, MD;  Location: [facility];   Service: Cardiovascular;  Laterality: Right;   LOWER EXTREMITY ANGIOGRAPHY Right 12/02/2020   Procedure: LOWER EXTREMITY ANGIOGRAPHY;  Surgeon: Annice Needy, MD;  Location: [facility];  Service: Cardiovascular;  Laterality: Right;   LOWER EXTREMITY ANGIOGRAPHY Right 12/23/2020   Procedure: Lower Extremity Angiography;  Surgeon: Annice Needy, MD;  Location: [facility];  Service: Cardiovascular;  Laterality: Right;   LOWER EXTREMITY ANGIOGRAPHY Right 12/24/2020   Procedure: Lower Extremity Angiography;  Surgeon: Annice Needy, MD;  Location: [facility];  Service: Cardiovascular;  Laterality: Right;   LOWER EXTREMITY ANGIOGRAPHY Right 10/20/2021   Procedure: Lower Extremity Angiography;  Surgeon: Annice Needy, MD;  Location: [facility];  Service: Cardiovascular;  Laterality: Right;   LOWER EXTREMITY ANGIOGRAPHY Right 07/03/2022   Procedure: Lower Extremity Angiography;  Surgeon: Annice Needy, MD;  Location: [facility];  Service: Cardiovascular;  Laterality: Right;  NECK SURGERY     NOSE SURGERY     ROTATOR CUFF REPAIR     x2   SHOULDER ARTHROSCOPY  12/07/2011   Procedure: ARTHROSCOPY SHOULDER;  Surgeon: Loreta Ave, MD;  Location: [facility];  Service: Orthopedics;  Laterality: Right;  Debridement Partial Cuff Tear, Release Coracoacromial Ligament   SHOULDER SURGERY  12/07/2011   right    SOCIAL HISTORY:   Social History   Tobacco Use   Smoking status: Some Days    Types: Cigarettes   Smokeless tobacco: Never   Tobacco comments:    pt states 2 cigs per month   Substance Use Topics   Alcohol use: No    FAMILY HISTORY:   Family History  Problem Relation Age of Onset   Alcoholism Brother    Aneurysm Other     DRUG ALLERGIES:  No Known Allergies  REVIEW OF SYSTEMS:   ROS As per history of present illness. All pertinent systems were reviewed above. Constitutional, HEENT, cardiovascular, respiratory, GI, GU,  musculoskeletal, neuro, psychiatric, endocrine, integumentary and hematologic systems were reviewed and are otherwise negative/unremarkable except for positive findings mentioned above in the HPI.   MEDICATIONS AT HOME:   Prior to Admission medications   Medication Sig Start Date End Date Taking? Authorizing Provider  albuterol (VENTOLIN HFA) 108 (90 Base) MCG/ACT inhaler Inhale 1-2 puffs into the lungs every 4 (four) hours as needed for shortness of breath or wheezing. 06/28/22   [provider]  ALPRAZolam Prudy Feeler) 1 MG tablet Take 1 tablet (1 mg total) by mouth 3 (three) times daily as needed. SNF use only 10/08/23   Sreenath, Sudheer B, MD  amLODipine (NORVASC) 5 MG tablet Take 1 tablet (5 mg total) by mouth daily. Patient not taking: Reported on 10/06/2023 05/10/23   Leeroy Bock, MD  ammonium lactate (LAC-HYDRIN) 12 % lotion APPLY TO AFFECTED AREA AS NEEDED FOR DRY SKIN 08/09/23   Candelaria Stagers, DPM  ASPIRIN LOW DOSE 81 MG tablet TAKE 1 TABLET BY MOUTH EVERY DAY 05/07/23   Georgiana Spinner, NP  atorvastatin (LIPITOR) 10 MG tablet Take 1 tablet (10 mg total) by mouth daily. Patient not taking: Reported on 05/06/2023 11/10/22 11/10/23  Arnetha Courser, MD  benzonatate (TESSALON) 100 MG capsule Take 1 capsule (100 mg total) by mouth 3 (three) times daily as needed for cough. 03/02/23   Gillis Santa, MD  COMBIVENT RESPIMAT 20-100 MCG/ACT AERS respimat Inhale 1-2 puffs into the lungs in the morning, at noon, in the evening, and at bedtime. 09/03/23   [provider]  feeding supplement (ENSURE ENLIVE / ENSURE PLUS) LIQD Take 237 mLs by mouth 3 (three) times daily between meals. 02/03/23   Darlin Priestly, MD  fluticasone-salmeterol (ADVAIR) 250-50 MCG/ACT AEPB Inhale 1 puff into the lungs 2 (two) times daily as needed.    [provider]  furosemide (LASIX) 20 MG tablet Take 20 mg by mouth daily. 07/20/23   [provider]  hydrOXYzine (ATARAX) 25 MG tablet Take 1  tablet (25 mg total) by mouth 3 (three) times daily as needed for anxiety. 03/02/23   Gillis Santa, MD  ipratropium-albuterol (DUONEB) 0.5-2.5 (3) MG/3ML SOLN Take 3 mLs by nebulization every 6 (six) hours as needed.    [provider]  iron polysaccharides (NIFEREX) 150 MG capsule Take 1 capsule (150 mg total) by mouth daily. 03/03/23 06/01/23  Gillis Santa, MD  levothyroxine (SYNTHROID) 50 MCG tablet Take 50 mcg by mouth every morning.  [provider]  melatonin 5 MG TABS Take 1 tablet (5 mg total) by mouth at bedtime. 03/02/23 03/01/24  Gillis Santa, MD  metoprolol tartrate (LOPRESSOR) 25 MG tablet Take 1 tablet (25 mg total) by mouth 2 (two) times daily. Skip the dose if systolic BP less than 130 mmHg and/or heart rate less than 65 beats per minute 03/02/23 10/06/23  Gillis Santa, MD  Multiple Vitamin (MULTIVITAMIN WITH MINERALS) TABS tablet Take 1 tablet by mouth daily. 11/10/22   Arnetha Courser, MD  omeprazole (PRILOSEC) 40 MG capsule Take 40 mg by mouth daily.    [provider]  polyethylene glycol (MIRALAX / GLYCOLAX) 17 g packet Take 17 g by mouth daily as needed. 05/16/23   Lolita Patella B, MD  potassium chloride SA (KLOR-CON M) 20 MEQ tablet Take 20 mEq by mouth daily.    [provider]  risperiDONE (RISPERDAL) 0.25 MG tablet Take 1 tablet (0.25 mg total) by mouth at bedtime. 03/02/23 10/06/23  Gillis Santa, MD  thiamine (VITAMIN B-1) 100 MG tablet Take 1 tablet (100 mg total) by mouth daily. Patient not taking: Reported on 10/06/2023 11/10/22   Arnetha Courser, MD  traZODone (DESYREL) 150 MG tablet Take 300 mg by mouth at bedtime.    [provider]      VITAL SIGNS:  Blood pressure (!) 143/75, pulse (!) 114, temperature 98.3 F (36.8 C), temperature source Oral, resp. rate 17, weight 52.3 kg, SpO2 96%.  PHYSICAL EXAMINATION:  Physical Exam  GENERAL:  77 y.o.-year-old patient semi-lying in the bed with mild respiratory distress with  conversational dyspnea EYES: Pupils equal, round, reactive to light and accommodation. No scleral icterus. Extraocular muscles intact.  HEENT: Head atraumatic, normocephalic. Oropharynx and nasopharynx clear.  NECK:  Supple, no jugular venous distention. No thyroid enlargement, no tenderness.  LUNGS: Diffuse expiratory wheezes and occasional inspiratory wheezes with tight expiratory flow and harsh vesicular breathing.. No use of accessory muscles of respiration.  CARDIOVASCULAR: Regular rate and rhythm, S1, S2 normal. No murmurs, rubs, or gallops.  ABDOMEN: Soft, nondistended, nontender. Bowel sounds present. No organomegaly or mass.  EXTREMITIES: No pedal edema, cyanosis, or clubbing.  NEUROLOGIC: Cranial nerves II through XII are intact. Muscle strength 5/5 in all extremities. Sensation intact. Gait not checked.  PSYCHIATRIC: The patient is alert and oriented x 3.  Normal affect and good eye contact. SKIN: Right big toe erythema and swelling with tenderness and whitish eschar with mild scabbing/black discoloration.Marland Kitchen   LABORATORY PANEL:   CBC Recent Labs  Lab 11/05/23 1322  WBC 11.2*  HGB 9.0*  HCT 29.9*  PLT 611*   ------------------------------------------------------------------------------------------------------------------  Chemistries  Recent Labs  Lab 11/05/23 1322  NA 139  K 3.5  CL 103  CO2 26  GLUCOSE 103*  BUN 11  CREATININE 0.59  CALCIUM 8.5*  AST 16  ALT 12  ALKPHOS 77  BILITOT 0.3   ------------------------------------------------------------------------------------------------------------------  Cardiac Enzymes No results for input(s): "TROPONINI" in the last 168 hours. ------------------------------------------------------------------------------------------------------------------  RADIOLOGY:  DG Foot 2 Views Right Result Date: 11/06/2023 CLINICAL DATA:  Wound on right foot. EXAM: RIGHT FOOT - 2 VIEW COMPARISON:  10/02/2023 FINDINGS: Amputation  of the 2nd-5th toes at the MTP joint. Demineralization. No evidence of acute fracture. No dislocation. No evidence of osteomyelitis. Soft tissue swelling about the forefoot and first toe. IMPRESSION: Soft tissue swelling about the forefoot and first toe. No evidence of osteomyelitis. Electronically Signed   By: Minerva Fester M.D.   On: 11/06/2023 02:06  DG Chest Port 1 View Result Date: 11/06/2023 CLINICAL DATA:  Shortness of breath EXAM: PORTABLE CHEST 1 VIEW COMPARISON:  10/01/2023 FINDINGS: Stable cardiomediastinal silhouette. Aortic atherosclerotic calcification. Hyperinflation and chronic bronchitic change. Increased interstitial markings and ground-glass opacities in the right lower lung compared to 10/01/2023. This may be projectional or due to infection superimposed on emphysema. No pleural effusion or pneumothorax. Cervical spine fusion. IMPRESSION: Increased interstitial markings and ground-glass opacities in the right lower lung compared to 10/01/2023. This may be due to confluence of shadows or infection superimposed on emphysema. Electronically Signed   By: Minerva Fester M.D.   On: 11/06/2023 02:04      IMPRESSION AND PLAN:  Assessment and Plan: * Cellulitis of great toe of right foot - This is associated with impending gangrene without clear evidence of osteomyelitis on x-ray. - We will continue IV vancomycin and Zosyn. - Wound care consult to be obtained. - Wound culture will be obtained. - Podiatry consult to be obtained. - I notified Dr. Annamary Rummage about the patient.  COPD exacerbation (HCC) - The patient will be admitted to a medically monitored bed. - We will place the patient IV steroid therapy with IV Solu-Medrol as well as nebulized bronchodilator therapy with duonebs q.i.d. and q.4 hours p.r.n.Marland Kitchen - Mucolytic therapy will be provided with Mucinex and antibiotic therapy with IV Rocephin . - O2 protocol will be followed.   Hypothyroidism - We will continue  Synthroid.  Dyslipidemia - We will continue statin therapy.  Anxiety and depression We will continue Xanax and Risperdal.  Essential hypertension - We will continue antihypertensive therapy.   DVT prophylaxis: Lovenox.  Advanced Care Planning:  Code Status: full code.  Family Communication:  The plan of care was discussed in details with the patient (and family). I answered all questions. The patient agreed to proceed with the above mentioned plan. Further management will depend upon hospital course. Disposition Plan: Back to previous home environment Consults called: Podiatry All the records are reviewed and case discussed with ED provider.  Status is: Inpatient   At the time of the admission, it appears that the appropriate admission status for this patient is inpatient.  This is judged to be reasonable and necessary in order to provide the required intensity of service to ensure the patient's safety given the presenting symptoms, physical exam findings and initial radiographic and laboratory data in the context of comorbid conditions.  The patient requires inpatient status due to high intensity of service, high risk of further deterioration and high frequency of surveillance required.  I certify that at the time of admission, it is my clinical judgment that the patient will require inpatient hospital care extending more than 2 midnights.                            Dispo: The patient is from: Home              Anticipated d/c is to: Home              Patient currently is not medically stable to d/c.              Difficult to place patient: No  Hannah Beat M.D on 11/06/2023 at 6:04 AM  [facility]   From 7 PM-7 AM, contact night-coverage [email or web address]  CC: Primary care physician; [name], MD

## 2023-11-06 NOTE — ED Notes (Signed)
Informed RN Candace via chat/ pt has bed assigned

## 2023-11-06 NOTE — ED Notes (Signed)
Patient calling out to see RN

## 2023-11-06 NOTE — ED Notes (Signed)
Patients call light answered. Patient IV beeping and restarted by this RN.

## 2023-11-06 NOTE — ED Notes (Signed)
Patient yelling in room and pulling off her cardiac monitor leads.

## 2023-11-06 NOTE — Progress Notes (Signed)
OT Cancellation Note  Patient Details Name: [name] MRN: [id number] DOB: [birth year]   Cancelled Treatment:    Reason Eval/Treat Not Completed: Pain limiting ability to participate. Order received, chart reviewed. On arrival pt asking for phone to call in meal, reports pain limiting any mobility attempts, will hold. Plan for vascular procedure next date.   Kathie Dike, M.S. OTR/L  11/06/23, 3:39 PM  ascom [phone]

## 2023-11-06 NOTE — Assessment & Plan Note (Addendum)
-   This is associated with impending gangrene without clear evidence of osteomyelitis on x-ray. - We will continue IV vancomycin and Zosyn. - Wound care consult to be obtained. - Wound culture will be obtained. - Podiatry consult to be obtained. - I notified Dr. Annamary Rummage about the patient.

## 2023-11-06 NOTE — ED Notes (Signed)
Patient cleaned and new brief and pad placed. New sheet put on bed. Purwick in place. Patient given warm blanket. Patients nasal canula changed per request

## 2023-11-06 NOTE — Assessment & Plan Note (Signed)
-   We will continue statin therapy. 

## 2023-11-07 ENCOUNTER — Encounter: Payer: Self-pay | Admitting: Family Medicine

## 2023-11-07 DIAGNOSIS — I739 Peripheral vascular disease, unspecified: Secondary | ICD-10-CM

## 2023-11-07 DIAGNOSIS — L03031 Cellulitis of right toe: Secondary | ICD-10-CM | POA: Diagnosis not present

## 2023-11-07 DIAGNOSIS — L089 Local infection of the skin and subcutaneous tissue, unspecified: Secondary | ICD-10-CM | POA: Diagnosis not present

## 2023-11-07 DIAGNOSIS — I96 Gangrene, not elsewhere classified: Secondary | ICD-10-CM

## 2023-11-07 LAB — BASIC METABOLIC PANEL
Anion gap: 8 (ref 5–15)
BUN: 13 mg/dL (ref 8–23)
CO2: 28 mmol/L (ref 22–32)
Calcium: 8.5 mg/dL — ABNORMAL LOW (ref 8.9–10.3)
Chloride: 105 mmol/L (ref 98–111)
Creatinine, Ser: 0.52 mg/dL (ref 0.44–1.00)
GFR, Estimated: >60 mL/min (ref >60)
Glucose, Bld: 104 mg/dL — ABNORMAL HIGH (ref 70–99)
Potassium: 3.8 mmol/L (ref 3.5–5.1)
Sodium: 141 mmol/L (ref 135–145)

## 2023-11-07 LAB — CBC
HCT: 25.8 % — ABNORMAL LOW (ref 36.0–46.0)
Hemoglobin: 8.2 g/dL — ABNORMAL LOW (ref 12.0–15.0)
MCH: 27.8 pg (ref 26.0–34.0)
MCHC: 31.8 g/dL (ref 30.0–36.0)
MCV: 87.5 fL (ref 80.0–100.0)
Platelets: 514 10*3/uL — ABNORMAL HIGH (ref 150–400)
RBC: 2.95 MIL/uL — ABNORMAL LOW (ref 3.87–5.11)
RDW: 15.1 % (ref 11.5–15.5)
WBC: 12.1 10*3/uL — ABNORMAL HIGH (ref 4.0–10.5)
nRBC: 0 % (ref 0.0–0.2)

## 2023-11-07 MED ORDER — OXYCODONE HCL ER 10 MG PO T12A
10.0000 mg | EXTENDED_RELEASE_TABLET | Freq: Two times a day (BID) | ORAL | Status: DC
  Administered 2023-11-07 – 2023-11-09 (×3): 10 mg via ORAL
  Filled 2023-11-07 (×3): qty 1

## 2023-11-07 MED ORDER — MORPHINE SULFATE (PF) 2 MG/ML IV SOLN
2.0000 mg | INTRAVENOUS | Status: DC | PRN
  Administered 2023-11-07 – 2023-11-08 (×6): 2 mg via INTRAVENOUS
  Filled 2023-11-07 (×4): qty 1

## 2023-11-07 MED ORDER — IPRATROPIUM-ALBUTEROL 20-100 MCG/ACT IN AERS
1.0000 | INHALATION_SPRAY | Freq: Four times a day (QID) | RESPIRATORY_TRACT | Status: DC
  Administered 2023-11-07: 1 via RESPIRATORY_TRACT
  Administered 2023-11-08: 2 via RESPIRATORY_TRACT
  Filled 2023-11-07: qty 4

## 2023-11-07 MED ORDER — ASPIRIN 81 MG PO TBEC: 81.0000 mg | DELAYED_RELEASE_TABLET | Freq: Every day | ORAL | Status: DC

## 2023-11-07 MED ORDER — IPRATROPIUM-ALBUTEROL 0.5-2.5 (3) MG/3ML IN SOLN: 3.0000 mL | Freq: Two times a day (BID) | RESPIRATORY_TRACT | Status: DC

## 2023-11-07 MED ORDER — MELATONIN 5 MG PO TABS
5.0000 mg | ORAL_TABLET | Freq: Every day | ORAL | Status: DC
  Administered 2023-11-07: 5 mg via ORAL
  Filled 2023-11-07: qty 1

## 2023-11-07 MED ORDER — IPRATROPIUM-ALBUTEROL 0.5-2.5 (3) MG/3ML IN SOLN: 3.0000 mL | Freq: Three times a day (TID) | RESPIRATORY_TRACT | Status: DC

## 2023-11-07 MED ORDER — OXYCODONE HCL 5 MG PO TABS
5.0000 mg | ORAL_TABLET | ORAL | Status: DC | PRN
  Administered 2023-11-07: 5 mg via ORAL
  Administered 2023-11-07: 10 mg via ORAL
  Administered 2023-11-08: 5 mg via ORAL
  Administered 2023-11-10: 10 mg via ORAL
  Filled 2023-11-07: qty 2
  Filled 2023-11-07: qty 1
  Filled 2023-11-07: qty 2

## 2023-11-07 MED ORDER — IPRATROPIUM-ALBUTEROL 0.5-2.5 (3) MG/3ML IN SOLN
3.0000 mL | Freq: Four times a day (QID) | RESPIRATORY_TRACT | Status: DC | PRN
  Filled 2023-11-07: qty 3

## 2023-11-07 MED ORDER — TRAZODONE HCL 50 MG PO TABS
300.0000 mg | ORAL_TABLET | Freq: Every day | ORAL | Status: DC
  Administered 2023-11-07 – 2023-11-10 (×2): 300 mg via ORAL
  Filled 2023-11-07: qty 6
  Filled 2023-11-07: qty 3
  Filled 2023-11-07: qty 6

## 2023-11-07 MED ORDER — VANCOMYCIN HCL 750 MG/150ML IV SOLN
750.0000 mg | INTRAVENOUS | Status: DC
  Filled 2023-11-07 (×2): qty 150

## 2023-11-07 NOTE — Progress Notes (Addendum)
Pt desated to 82%, so RN raised oxygen to 4 L. Pt saturation went up to 90%. RN called RT to give pt prn DUONEB treatment. RT said that she will come and give the DUONEB treatment.

## 2023-11-07 NOTE — Progress Notes (Signed)
PROGRESS NOTE    [name] [employer] [facility]  XLK:[id number] DOB: Oct 22, 1946 DOA: 11/05/2023 PCP: [name], MD    Brief Narrative:  77 y.o. female with past medical history significant for CAD, COPD/chronic hypoxic respiratory failure on 3 L nasal cannula baseline, HTN, HLD, hypothyroidism, peripheral vascular disease, anxiety/depression, GERD, esophageal spasm who presented to [facility] ED on 12/16 via EMS from home after being seen by home health nurse with concern of right foot infection.  Home health RN found patient sitting in her feces and urine all night.  Apparently her right great toe has been looking significantly worse with swelling, erythema and drainage over the last few weeks.  Recently discharged from [facility].  Patient also endorses worsening cough, wheezing and dyspnea over the last several days as well.  She admitted to chills but denies fever.  No nausea/vomiting or abdominal pain, no urinary symptoms.   In the ED, temperature 98.0 F, HR 132, RR 23, BP 92/58, SpO2 97% on 3 L nasal cannula.  WBC 11.2, hemoglobin 9.0, platelet count 611.  Sodium 139, potassium 3.5, chloride 103, CO2 26, glucose 103, BUN 11, creatinine 0.59.  AST 16, ALT 12, total bilirubin 0.3.  High sensitive troponin 5.  Lactic acid 1.6.  Chest x-ray with increased interstitial markings/GGO right lower lung field.  Right foot x-ray with soft tissue swelling about the forefoot and first toe, no evidence of osteomyelitis.  Patient was given 2 DuoNebs, 125 mg of IV Solu-Medrol, 4 mg of IV morphine sulfate, 1 mg of p.o. Xanax, 1 L bolus of IV normal saline as well as IV vancomycin and Zosyn. TRH consulted for admission for further evaluation and management.    Assessment & Plan:   Principal Problem:   Cellulitis of great toe of right foot Active Problems:   COPD exacerbation (HCC)   Hypothyroidism   Dyslipidemia   Anxiety and depression   Essential hypertension   Gangrene of toe of right foot (HCC) Right foot  cellulitis Gangrenous changes right great toe Intractable pain Patient presenting from home with progressive swelling, erythema, discoloration and drainage from right great toe.  History of peripheral vascular disease; follows with vascular surgery, Dr. [name] outpatient and podiatry Dr. Allena Katz.  Patient afebrile with slightly elevated WBC count of 11.2.  Right foot x-ray with soft tissue swelling about the forefoot/first toe, no evidence of osteomyelitis. -- Podiatry following, appreciate assistance -- Vascular surgery consulted Plan: Continue broad-spectrum IV antibiotics Angiography with vascular surgery 12/19 Partial ray amputation with podiatry 12/20 Multimodal pain control  Acute COPD exacerbation Chronic hypoxic respiratory failure Patient reporting increasing shortness of breath, wheezing and nonproductive cough.  Chest x-ray with increased interstitial markings/GGO right lower lung fields. Still wheezing on exam Plan: P.o. prednisone 40 mg daily Bronchodilators IV antibiotics as above Mucolytic's and antitussives Supplemental oxygen as needed   Essential hypertension -- Metoprolol tartrate 25 mg p.o. twice daily -- Furosemide 20 mg p.o. daily   Hyperlipidemia Currently not on statin outpatient. -- Check lipid panel   Hypothyroidism -- Levothyroxine 50 mcg p.o. daily   Iron deficiency anemia Hemoglobin 9.5, stable -- Iron polysaccharide 650 mg p.o. daily   Anxiety/depression -- Alprazolam 1 mg p.o. 3 times daily as needed anxiety -- Risperdal 0.25 m p.o. nightly   GERD -- Protonix 40 mg p.o. daily   Insomnia -- Trazodone 300 mg p.o. nightly    DVT prophylaxis: Lovenox Code Status: Full Family Communication: None Disposition Plan: Status is: Inpatient Remains inpatient appropriate because:  Gangrene of toe   Level of care: Telemetry Medical  Consultants:  Podiatry Vascular surgery  Procedures:   None  Antimicrobials: Vancomycin Zosyn   Subjective: Seen and examined.  Perseverates on pain in the right toe  Objective: Vitals:   11/06/23 2352 11/07/23 0736 11/07/23 0853 11/07/23 0943  BP: (!) 160/88  139/75 139/75  Pulse: (!) 110  (!) 110 (!) 110  Resp: 20  20   Temp: 97.9 F (36.6 C)  97.7 F (36.5 C)   TempSrc: Oral  Oral   SpO2: 100% 100% 98%   Weight: 48.9 kg     Height: 5\' 4"  (1.626 m)       Intake/Output Summary (Last 24 hours) at 11/07/2023 1250 Last data filed at 11/07/2023 1053 Gross per 24 hour  Intake 480 ml  Output --  Net 480 ml   Filed Weights   11/06/23 0329 11/06/23 2352  Weight: 52.3 kg 48.9 kg    Examination:  General exam: Appears anxious Respiratory system: Scattered coarse crackles bilaterally.  Normal work of breathing.  2 L Cardiovascular system: S1-S2, RRR, no murmurs, no pedal edema Gastrointestinal system: Soft, NT/ND, normal bowel sounds Central nervous system: Alert and oriented. No focal neurological deficits. Extremities: Symmetric 5 x 5 power.  Gangrenous changes right great toe Skin: Right great toe black in color Psychiatry: Judgement and insight appear impaired. Mood & affect anxious.     Data Reviewed: I have personally reviewed following labs and imaging studies  CBC: Recent Labs  Lab 11/05/23 1322 11/06/23 0603 11/07/23 0246  WBC 11.2* 10.1 12.1*  NEUTROABS 8.5*  --   --   HGB 9.0* 9.5* 8.2*  HCT 29.9* 30.6* 25.8*  MCV 92.6 91.1 87.5  PLT 611* 521* 514*   Basic Metabolic Panel: Recent Labs  Lab 11/05/23 1322 11/06/23 0603 11/07/23 0246  NA 139 138 141  K 3.5 3.8 3.8  CL 103 104 105  CO2 26 26 28   GLUCOSE 103* 178* 104*  BUN 11 13 13   CREATININE 0.59 0.68 0.52  CALCIUM 8.5* 8.3* 8.5*   GFR: Estimated Creatinine Clearance: 46.2 mL/min (by C-G formula based on SCr of 0.52 mg/dL). Liver Function Tests: Recent Labs  Lab 11/05/23 1322  AST 16  ALT 12  ALKPHOS 77  BILITOT 0.3  PROT 6.5   ALBUMIN 2.8*   No results for input(s): "LIPASE", "AMYLASE" in the last 168 hours. No results for input(s): "AMMONIA" in the last 168 hours. Coagulation Profile: No results for input(s): "INR", "PROTIME" in the last 168 hours. Cardiac Enzymes: No results for input(s): "CKTOTAL", "CKMB", "CKMBINDEX", "TROPONINI" in the last 168 hours. BNP (last 3 results) No results for input(s): "PROBNP" in the last 8760 hours. HbA1C: No results for input(s): "HGBA1C" in the last 72 hours. CBG: No results for input(s): "GLUCAP" in the last 168 hours. Lipid Profile: Recent Labs    11/06/23 0603  CHOL 138  HDL 51  LDLCALC 80  TRIG 35  CHOLHDL 2.7   Thyroid Function Tests: No results for input(s): "TSH", "T4TOTAL", "FREET4", "T3FREE", "THYROIDAB" in the last 72 hours. Anemia Panel: No results for input(s): "VITAMINB12", "FOLATE", "FERRITIN", "TIBC", "IRON", "RETICCTPCT" in the last 72 hours. Sepsis Labs: Recent Labs  Lab 11/05/23 1322  LATICACIDVEN 1.6    Recent Results (from the past 240 hours)  Culture, blood (routine x 2)     Status: None (Preliminary result)   Collection Time: 11/06/23 12:15 AM   Specimen: BLOOD  Result Value Ref Range Status  Specimen Description BLOOD RIGHT ARM  Final   Special Requests   Final    BOTTLES DRAWN AEROBIC AND ANAEROBIC Blood Culture adequate volume   Culture   Final    NO GROWTH 1 DAY Performed at [facility], [address] [place], [place] [address]    Report Status PENDING  Incomplete  Culture, blood (routine x 2)     Status: None (Preliminary result)   Collection Time: 11/06/23 12:17 AM   Specimen: BLOOD  Result Value Ref Range Status   Specimen Description BLOOD LEFT ARM  Final   Special Requests   Final    BOTTLES DRAWN AEROBIC AND ANAEROBIC Blood Culture adequate volume   Culture   Final    NO GROWTH 1 DAY Performed at [facility], [address], [place], [place] [address]    Report Status PENDING   Incomplete  Aerobic/Anaerobic Culture w Gram Stain (surgical/deep wound)     Status: None (Preliminary result)   Collection Time: 11/06/23  1:43 AM   Specimen: Toe  Result Value Ref Range Status   Specimen Description   Final    TOE Performed at [facility], [address] [place], [place] [address]    Special Requests   Final    NONE Performed at [facility], [address], [place], [place] [address]    Gram Stain NO WBC SEEN FEW GRAM POSITIVE COCCI   Final   Culture   Final    ABUNDANT STAPHYLOCOCCUS AUREUS CULTURE REINCUBATED FOR BETTER GROWTH SUSCEPTIBILITIES TO FOLLOW Performed at [facility], [address] [place], [place] [address]    Report Status PENDING  Incomplete         Radiology Studies: DG Foot 2 Views Right Result Date: 11/06/2023 CLINICAL DATA:  Wound on right foot. 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 Demineralization. No evidence of acute fracture. No dislocation. No evidence of osteomyelitis. Soft tissue swelling about the forefoot and first toe. IMPRESSION: Soft tissue swelling about the forefoot and first toe. No evidence of osteomyelitis. Electronically Signed   By: Minerva Fester M.D.   On: 11/06/2023 02:06   DG Chest Port 1 View Result Date: 11/06/2023 CLINICAL DATA:  Shortness of breath EXAM: PORTABLE CHEST 1 VIEW COMPARISON:  10/01/2023 FINDINGS: Stable cardiomediastinal silhouette. Aortic atherosclerotic calcification. Hyperinflation and chronic bronchitic change. Increased interstitial markings and ground-glass opacities in the right lower lung compared to 10/01/2023. This may be projectional or due to infection superimposed on emphysema. No pleural effusion or pneumothorax. Cervical spine fusion. IMPRESSION: Increased interstitial markings and ground-glass opacities in the right lower lung compared to 10/01/2023. This may be due to confluence of  shadows or infection superimposed on emphysema. Electronically Signed   By: Minerva Fester M.D.   On: 11/06/2023 02:04        Scheduled Meds:  enoxaparin (LOVENOX) injection  40 mg Subcutaneous Q24H   feeding supplement  237 mL Oral TID BM   fluticasone furoate-vilanterol  1 puff Inhalation Daily   furosemide  20 mg Oral Daily   guaiFENesin  600 mg Oral BID   ipratropium-albuterol  3 mL Nebulization TID   iron polysaccharides  150 mg Oral Daily   levothyroxine  50 mcg Oral Q0600   metoprolol tartrate  25 mg Oral BID   multivitamin with minerals  1 tablet Oral Daily   oxyCODONE  10 mg Oral Q12H  pantoprazole  40 mg Oral Daily   potassium chloride SA  20 mEq Oral Daily   predniSONE  40 mg Oral Q breakfast   risperiDONE  0.25 mg Oral QHS   traZODone  300 mg Oral QHS   Continuous Infusions:  piperacillin-tazobactam (ZOSYN)  IV 3.375 g (11/07/23 0659)   vancomycin 1,250 mg (11/07/23 1230)     LOS: 1 day     Tresa Moore, MD [facility]   If 7PM-7AM, please contact night-coverage  11/07/2023, 12:50 PM

## 2023-11-07 NOTE — Consult Note (Signed)
PODIATRY CONSULTATION  NAME [name] MRN [id number] DOB Feb 22, 1946 DOA 11/05/2023   Reason for consult:  Chief Complaint  Patient presents with   Foot Injury    Attending/Consulting physician: Rudene Christians MD  History of present illness: "77 y.o. female with past medical history significant for CAD, COPD/chronic hypoxic respiratory failure on 3 L nasal cannula baseline, HTN, HLD, hypothyroidism, peripheral vascular disease, anxiety/depression, GERD, esophageal spasm who presented to [facility] ED on 12/16 via EMS from home after being seen by home health nurse with concern of right foot infection.  Home health RN found patient sitting in her feces and urine all night.  Apparently her right great toe has been looking significantly worse with swelling, erythema and drainage over the last few weeks.  Recently discharged from [facility].  Patient also endorses worsening cough, wheezing and dyspnea over the last several days as well.  She admitted to chills but denies fever.  No nausea/vomiting or abdominal pain, no urinary symptoms. "  In ED patient called 911 stating they were holding her hostage.  Calling for pain meds frequently. Getting verbally abusive with staff.  Has has multiple prior toe amputation 2-5. Most recently R 5th toe amp by dr. Lilian Kapur 07/06/23. States the toe has gotten worse lately.   Wants fentanyl when I talked to her this AM. States she knows the toe needs to come off.   Past Medical History:  Diagnosis Date   Anxiety    CAD (coronary artery disease)    Carpal tunnel syndrome    Cataract    Complication of anesthesia    has woken up at times   COPD (chronic obstructive pulmonary disease) (HCC)    CRP elevated 09/14/2015   Depression    Difficult intubation    Dyslipidemia    Dyspnea    DOE   Dysrhythmia    hx palpatations   Elevated sedimentation rate 09/14/2015   Esophageal spasm    Gastrointestinal parasites    GERD (gastroesophageal reflux  disease)    HCAP (healthcare-associated pneumonia) 10/22/2018   Hiatal hernia    History of peptic ulcer disease    Hypertension    Hyperthyroidism    Hypothyroidism    Low magnesium levels 09/14/2015   Nicotine dependence 09/14/2015   Pelvic fracture (HCC) 2008   fall from riding a horse   Peripheral vascular disease (HCC)    Reflux    Rotator cuff injury    Sepsis (HCC) 10/22/2018   Stenosis, spinal, lumbar        Latest Ref Rng & Units 11/07/2023    2:46 AM 11/06/2023    6:03 AM 11/05/2023    1:22 PM  CBC  WBC 4.0 - 10.5 K/uL 12.1  10.1  11.2   Hemoglobin 12.0 - 15.0 g/dL 8.2  9.5  9.0   Hematocrit 36.0 - 46.0 % 25.8  30.6  29.9   Platelets 150 - 400 K/uL 514  521  611        Latest Ref Rng & Units 11/07/2023    2:46 AM 11/06/2023    6:03 AM 11/05/2023    1:22 PM  BMP  Glucose 70 - 99 mg/dL 409  811  914   BUN 8 - 23 mg/dL 13  13  11    Creatinine 0.44 - 1.00 mg/dL 7.82  9.56  2.13   Sodium 135 - 145 mmol/L 141  138  139   Potassium 3.5 - 5.1 mmol/L 3.8  3.8  3.5   Chloride 98 - 111 mmol/L 105  104  103   CO2 22 - 32 mmol/L 28  26  26    Calcium 8.9 - 10.3 mg/dL 8.5  8.3  8.5       Physical Exam: Lower Extremity Exam Vasc: R - PT  non palpable, DP non palpable.    L - PT non palpable, DP non palpable.   Derm: R - R hallux gangrenous and necrotic changes.  Superficial pressure ulceration right heel without signs of infection.   L -  Superficial pressure ulcer Left heel, no signs of infection there  MSK:  R - s/p 2nd -5th toe amp at MPJ level. Tender to palpation forefoot and heel.   L -  No gross deformities. Compartments soft, non-tender, compressible  Neuro: R - Gross sensation diminished. Gross motor function intact   L - Gross sensation diminished. Gross motor function intact    ASSESSMENT/PLAN OF CARE 77 y.o. 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  WBC 12.1   - NPO at [facility] Thursday for OR Friday 12/20 for RIGHT hallux amputation, possible 1st met head resection for closure as needed.  - Vascular consulted and planning angio tmrw for optimizing RLE arterial flow, appreciate - Continue IV abx broad spectrum pending further culture data - Anticoagulation: Hold pending OR - Wound care: none needed pre op - for heels cover with xeroform over wounds and then apply border foram dressing. Offload heels with prevalon boots or float off pillow.  - WB status: WBAT to right foot pre op, post op shoe ordered - Will continue to follow   Thank you for the consult.  Please contact me directly with any questions or concerns.           Corinna Gab, DPM [facility]    [address] [place], [place] [address]                Office [phone]  Fax [phone]

## 2023-11-07 NOTE — Evaluation (Signed)
Occupational Therapy Evaluation Patient Details Name: [name] MRN: [id number] DOB: 24-Dec-1945 Today's Date: 11/07/2023   History of Present Illness 77 y.o. female with past medical history significant for CAD, COPD/chronic hypoxic respiratory failure on 3 L nasal cannula baseline, HTN, HLD, hypothyroidism, peripheral vascular disease, anxiety/depression, GERD, esophageal spasm who presented to [facility] ED on 12/16 via EMS from home after being seen by home health nurse with concern of right foot infection.  Patient endorses her right great toe has been looking significantly worse with swelling, erythema and drainage over the last few weeks. Recently discharged from [facility].   Clinical Impression   Pt was seen for OT evaluation this date. Prior to hospital admission, pt was recently discharged from short term rehab. Pt endorses she had a great experience at rehab and was pleased with the progress she made but then once she returned home she began having more issues with her R foot. Pt eager to return to [facility] to improve strength in order to facilitate successful return home. Pt presents to acute OT demonstrating impaired ADL performance and functional mobility 2/2 significant R great toe/foot pain, decreased balance, strength, and safety awareness (See OT problem list for additional functional deficits). Pt received semi reclined and windswept to the R propped on her R elbow and attempting to eat. With encouragement and education in positioning to improve her access to her meal and to prevent aspiration pt agreeable to repositioning, requiring only MIN A to come to long sitting unsupported in the bed. HOB elevated for back support. Pt appreciative, endorsing she has a history of getting "choked up." Further reiterated positioning to decrease risk during meals. Pt required set up to cut food and open packets/containers but then was able to feed her self after setup. Pt currently requires  increased assist for bathing, dressing, toileting, and mobility. Recommend further skilled OT services to address noted impairments and functional limitations (see below for any additional details) in order to maximize safety and independence while minimizing falls risk and caregiver burden.     If plan is discharge home, recommend the following: A lot of help with walking and/or transfers;A lot of help with bathing/dressing/bathroom;Assistance with cooking/housework;Assist for transportation;Help with stairs or ramp for entrance;Direct supervision/assist for medications management    Functional Status Assessment  Patient has had a recent decline in their functional status and demonstrates the ability to make significant improvements in function in a reasonable and predictable amount of time.  Equipment Recommendations  Other (comment) (defer to next venue of care)    Recommendations for Other Services       Precautions / Restrictions Precautions Precautions: Fall Restrictions Other Position/Activity Restrictions: Secure chat with podiatry, agreeable to forefoot offloading post op shoe to improve comfort with mobility      Mobility Bed Mobility               General bed mobility comments: MIN A for repositioning and long sitting in bed to improve access to her meal tray. Pt declined further attempts at EOB/OOB.    Transfers                          Balance Overall balance assessment: Needs assistance Sitting-balance support: No upper extremity supported, Feet supported Sitting balance-Leahy Scale: Fair Sitting balance - Comments: long sitting in bed  ADL either performed or assessed with clinical judgement   ADL Overall ADL's : Needs assistance/impaired                                       General ADL Comments: Pt required setup assist to cut her pancakes and open containers/packets. Indep  after set up. She requires set up for grooming tasks. Anticipate pt will require MOD A for LB ADL tasks and MIN A for seated UB ADL tasks based on deficits noted.     Vision         Perception         Praxis         Pertinent Vitals/Pain Pain Assessment Pain Assessment: 0-10 Pain Score: 8  Pain Location: R foot Pain Descriptors / Indicators: Aching Pain Intervention(s): Monitored during session, Limited activity within patient's tolerance, Premedicated before session, Repositioned, Patient requesting pain meds-RN notified     Extremity/Trunk Assessment Upper Extremity Assessment Upper Extremity Assessment: Generalized weakness   Lower Extremity Assessment Lower Extremity Assessment: Generalized weakness;RLE deficits/detail RLE Deficits / Details: R foot significantly pain limited RLE: Unable to fully assess due to pain       Communication Communication Communication: Hearing impairment   Cognition Arousal: Alert Behavior During Therapy: WFL for tasks assessed/performed Overall Cognitive Status: No family/caregiver present to determine baseline cognitive functioning                                 General Comments: fixated on ailments and sharing past, cues to redirect. Decreased safety awareness noted.     General Comments       Exercises Other Exercises Other Exercises: Pt educated in positioning techniques to improve safety with eating and minimize risk of aspiration as she presented laying posteriorly and to the R side on her R elbow attempting to eat but also endorsing she has a history of getting "choked up."   Shoulder Instructions      Home Living Family/patient expects to be discharged to:: Skilled nursing facility                                 Additional Comments: Pt notes she needs rehab again and wants to return to [facility] and work with therapy to get strong again.      Prior Functioning/Environment                           OT Problem List: Decreased strength;Pain;Cardiopulmonary status limiting activity;Decreased activity tolerance;Impaired balance (sitting and/or standing);Decreased safety awareness;Decreased knowledge of use of DME or AE      OT Treatment/Interventions: Self-care/ADL training;Therapeutic exercise;Therapeutic activities;DME and/or AE instruction;Patient/family education;Balance training    OT Goals(Current goals can be found in the care plan section) Acute Rehab OT Goals Patient Stated Goal: go to [facility] for rehab to get stronger OT Goal Formulation: With patient Time For Goal Achievement: 11/21/23 Potential to Achieve Goals: Good ADL Goals Pt Will Perform Lower Body Dressing: sitting/lateral leans;with contact guard assist Pt Will Transfer to Toilet: with contact guard assist;ambulating;bedside commode (LRAD) Pt Will Perform Toileting - Clothing Manipulation and hygiene: with modified independence;sitting/lateral leans Additional ADL Goal #1: Pt will verbalize plan to implement at least 1 learned falls prevention strategy.  OT Frequency: Min 1X/week    Co-evaluation              AM-PAC OT "6 Clicks" Daily Activity     Outcome Measure Help from another person eating meals?: None (after setup) Help from another person taking care of personal grooming?: None Help from another person toileting, which includes using toliet, bedpan, or urinal?: A Lot Help from another person bathing (including washing, rinsing, drying)?: A Lot Help from another person to put on and taking off regular upper body clothing?: A Little Help from another person to put on and taking off regular lower body clothing?: A Lot 6 Click Score: 17   End of Session Equipment Utilized During Treatment: Oxygen Nurse Communication: Mobility status;Patient requests pain meds  Activity Tolerance: Patient limited by pain Patient left: in bed;with call bell/phone within reach;with bed alarm  set  OT Visit Diagnosis: Other abnormalities of gait and mobility (R26.89);Muscle weakness (generalized) (M62.81);Pain Pain - Right/Left: Right Pain - part of body: Ankle and joints of foot                Time: 2376-2831 OT Time Calculation (min): 14 min Charges:  OT General Charges $OT Visit: 1 Visit OT Evaluation $OT Eval Low Complexity: 1 Low  Arman Filter., MPH, MS, OTR/L ascom [phone] 11/07/23, 9:58 AM

## 2023-11-07 NOTE — Progress Notes (Signed)
Dr. Georgeann Oppenheim gave order to discontinue tele monitor.

## 2023-11-07 NOTE — Progress Notes (Signed)
Pharmacy Antibiotic Note  [name] is a 77 y.o. female admitted on 11/05/2023 with gangrene and osteomyelitis.  Pharmacy has been consulted for Vancomycin dosing.  Plan: Change Vancomycin to 750mg  IV q 24hrs (nex dose 12/19 @ 1200)   Goal AUC 400-550. Expected AUC: 498 Cmin 12.4 SCr used: 0.8 (actual 0.52) 2. Patient also on Zosyn 3.375gm IV q 8hrs (4hr infusion) 3. Will continue to follow cultures and renal function to adjust therapy as needed.  Height: 5\' 4"  (162.6 cm) Weight: 48.9 kg (107 lb 12.9 oz) IBW/kg (Calculated) : 54.7  Temp (24hrs), Avg:97.8 F (36.6 C), Min:97.5 F (36.4 C), Max:98.2 F (36.8 C)  Recent Labs  Lab 11/05/23 1322 11/06/23 0603 11/07/23 0246  WBC 11.2* 10.1 12.1*  CREATININE 0.59 0.68 0.52  LATICACIDVEN 1.6  --   --     Estimated Creatinine Clearance: 46.2 mL/min (by C-G formula based on SCr of 0.52 mg/dL).    No Known Allergies  Antimicrobials this admission:  Zosyn 12/17>>  Vancomycin 12/17>>   Dose adjustments this admission:   Microbiology results:  12/17 NWG:NFAO  12/17 Toe  ABUNDANT STAPHYLOCOCCUS AUREUS   Thank you for allowing pharmacy to be a part of this patient's care.  Porsche Noguchi Rodriguez-Guzman PharmD, BCPS 11/07/2023 1:40 PM

## 2023-11-07 NOTE — Progress Notes (Signed)
Progress Note    11/07/2023 11:11 AM * No surgery found *  Subjective:   is a 77 y.o. female with past medical history significant for CAD, COPD/chronic hypoxic respiratory failure on 3 L nasal cannula baseline, HTN, HLD, hypothyroidism, peripheral vascular disease, anxiety/depression, GERD, esophageal spasm who presented to [facility] ED on 12/16 via EMS from home after being seen by home health nurse with concern of right foot infection.  Patient endorses her right great toe has been looking significantly worse with swelling, erythema and drainage over the last few weeks. Recently discharged from [facility].    Vitals:   11/07/23 0853 11/07/23 0943  BP: 139/75 139/75  Pulse: (!) 110 (!) 110  Resp: 20   Temp: 97.7 F (36.5 C)   SpO2: 98%    Physical Exam: Cardiac:  RRR, Normal S1,S2. No murmur.  Lungs:  Diminished bilateral bases, normal non-labored breathing, without Rales, rhonchi,  wheezing  Incisions:  NONE Extremities:  with ischemic changes, with Gangrene , with cellulitis; with open wounds; to right great toe.   Abdomen:  Positive bowel sounds throughout, soft non tender and non distended.  Neurologic: AAOX3 answers all questions and follows commands appropriately.    CBC    Component Value Date/Time   WBC 12.1 (H) 11/07/2023 0246   RBC 2.95 (L) 11/07/2023 0246   HGB 8.2 (L) 11/07/2023 0246   HCT 25.8 (L) 11/07/2023 0246   PLT 514 (H) 11/07/2023 0246   MCV 87.5 11/07/2023 0246   MCH 27.8 11/07/2023 0246   MCHC 31.8 11/07/2023 0246   RDW 15.1 11/07/2023 0246   LYMPHSABS 1.6 11/05/2023 1322   MONOABS 0.7 11/05/2023 1322   EOSABS 0.2 11/05/2023 1322   BASOSABS 0.1 11/05/2023 1322    BMET    Component Value Date/Time   NA 141 11/07/2023 0246   NA 143 10/06/2019 1552   NA 138 02/10/2014 1335   K 3.8 11/07/2023 0246   K 4.5 02/10/2014 1335   CL 105 11/07/2023 0246   CL 106 02/10/2014 1335   CO2 28 11/07/2023 0246   CO2 28 02/10/2014 1335   GLUCOSE 104  (H) 11/07/2023 0246   GLUCOSE 99 02/10/2014 1335   BUN 13 11/07/2023 0246   BUN 7 (L) 10/06/2019 1552   BUN 6 (L) 02/10/2014 1335   CREATININE 0.52 11/07/2023 0246   CREATININE 0.89 02/10/2014 1335   CALCIUM 8.5 (L) 11/07/2023 0246   CALCIUM 8.7 02/10/2014 1335   GFRNONAA >60 11/07/2023 0246   GFRNONAA >60 02/10/2014 1335   GFRAA >60 12/18/2019 1012   GFRAA >60 02/10/2014 1335    INR    Component Value Date/Time   INR 1.0 07/05/2023 1141     Intake/Output Summary (Last 24 hours) at 11/07/2023 1111 Last data filed at 11/07/2023 1053 Gross per 24 hour  Intake 480 ml  Output --  Net 480 ml     Assessment/Plan:  77 y.o. female presents to [facility] emergency department with right foot tendon and tendon pain. * No surgery found *  PLAN: Vascular surgery plans on taking the patient to the vascular lab tomorrow 11/08/2023 for right lower extremity angiogram with possible intervention.  Again I discussed the procedure in detail with the patient at the bedside.  She agrees to proceed tomorrow.  Patient was made n.p.o. after midnight for procedure tomorrow.  Plan was discussed with Dr. Festus Barren MD and he agrees with plan.   Marcie Bal [facility] 11/07/2023 11:11 AM

## 2023-11-07 NOTE — Evaluation (Signed)
Physical Therapy Evaluation Patient Details Name: [name] MRN: [id number] DOB: 1946-01-31 Today's Date: 11/07/2023  History of Present Illness  77 y.o. female with past medical history significant for CAD, COPD/chronic hypoxic respiratory failure on 3 L nasal cannula baseline, HTN, HLD, hypothyroidism, peripheral vascular disease, anxiety/depression, GERD, esophageal spasm who presented to [facility] ED on 12/16 via EMS from home after being seen by home health nurse with concern of right foot infection.  Patient endorses her right great toe has been looking significantly worse with swelling, erythema and drainage over the last few weeks. Recently discharged from [facility]. Attempted to see pt this AM but she was only interested in her pancakes.  On second attempt this afternoon she was willing to get up and do a little, but was not feeling like she could try walking or doing any real mobility apart from a stand-pivot type transfers.  She ultimately did relatively well with the limited activity but likely would have struggled greatly with any attempts and due to this she defers further activity, siting pain and not wanting to over-do-it before her L foot procedures.  Pt will benefit from continued PT to address functional limitations.     If plan is discharge home, recommend the following: A lot of help with walking and/or transfers;A lot of help with bathing/dressing/bathroom;Assistance with cooking/housework;Assist for transportation;Help with stairs or ramp for entrance   Can travel by private vehicle   No    Equipment Recommendations  (TBD at next venue of care)  Recommendations for Other Services       Functional Status Assessment       Precautions / Restrictions Precautions Precautions: Fall Restrictions Other Position/Activity Restrictions: Secure chat with podiatry, agreeable to forefoot offloading post op shoe to improve comfort with mobility      Mobility  Bed  Mobility Overal bed mobility: Needs Assistance Bed Mobility: Sidelying to Sit, Supine to Sit   Sidelying to sit: Contact guard assist, Min assist Supine to sit: Contact guard, Min assist     General bed mobility comments: Pt guarded with L foot, but was able to transition to/from supine/sit w/ only minimal guidance and cuing    Transfers Overall transfer level: Needs assistance Equipment used: Rolling walker (2 wheels) Transfers: Sit to/from Stand Sit to Stand: Min assist, Contact guard assist           General transfer comment: Pt able to keep weight on posterior aspect of L foot (heel), did not wish to try more than side turn/scoots to/from [facility] but did so w/ only incidental contact and some extra time and cuing    Ambulation/Gait               General Gait Details: pt defers, does not want to over-do-it before her vascular/podiatry procedures  Stairs            Wheelchair Mobility     Tilt Bed    Modified Rankin (Stroke Patients Only)       Balance Overall balance assessment: Needs assistance Sitting-balance support: No upper extremity supported, Feet supported Sitting balance-Leahy Scale: Fair     Standing balance support: Bilateral upper extremity supported Standing balance-Leahy Scale: Fair Standing balance comment: expectedly reliant on UEs, no LOBs with functional transfer to/from [facility]                             Pertinent Vitals/Pain Pain Assessment Pain Assessment: 0-10 Pain Score:  5  Pain Location: R foot    Home Living                          Prior Function                       Extremity/Trunk Assessment                Communication      Cognition Arousal: Alert Behavior During Therapy: WFL for tasks assessed/performed Overall Cognitive Status: No family/caregiver present to determine baseline cognitive functioning                                          General  Comments      Exercises     Assessment/Plan    PT Assessment    PT Problem List         PT Treatment Interventions      PT Goals (Current goals can be found in the Care Plan section)       Frequency Min 1X/week     Co-evaluation               AM-PAC PT "6 Clicks" Mobility  Outcome Measure Help needed turning from your back to your side while in a flat bed without using bedrails?: None Help needed moving from lying on your back to sitting on the side of a flat bed without using bedrails?: A Little Help needed moving to and from a bed to a chair (including a wheelchair)?: A Little Help needed standing up from a chair using your arms (e.g., wheelchair or bedside chair)?: A Little Help needed to walk in hospital room?: A Lot Help needed climbing 3-5 steps with a railing? : Total 6 Click Score: 16    End of Session Equipment Utilized During Treatment: Gait belt Activity Tolerance: Patient tolerated treatment well;Patient limited by pain Patient left: with bed alarm set;with call bell/phone within reach Nurse Communication: Mobility status PT Visit Diagnosis: Unsteadiness on feet (R26.81);Muscle weakness (generalized) (M62.81);Pain;Difficulty in walking, not elsewhere classified (R26.2) Pain - Right/Left: Left Pain - part of body: Ankle and joints of foot    Time: 1440-1457 PT Time Calculation (min) (ACUTE ONLY): 17 min   Charges:   PT Evaluation $PT Eval Low Complexity: 1 Low PT Treatments $Therapeutic Activity: 8-22 mins PT General Charges $$ ACUTE PT VISIT: 1 Visit         Malachi Pro, DPT 11/07/2023, 3:25 PM

## 2023-11-07 NOTE — H&P (View-Only) (Signed)
Progress Note    11/07/2023 11:11 AM * No surgery found *  Subjective:   is a 77 y.o. female with past medical history significant for CAD, COPD/chronic hypoxic respiratory failure on 3 L nasal cannula baseline, HTN, HLD, hypothyroidism, peripheral vascular disease, anxiety/depression, GERD, esophageal spasm who presented to [facility] ED on 12/16 via EMS from home after being seen by home health nurse with concern of right foot infection.  Patient endorses her right great toe has been looking significantly worse with swelling, erythema and drainage over the last few weeks. Recently discharged from [facility].    Vitals:   11/07/23 0853 11/07/23 0943  BP: 139/75 139/75  Pulse: (!) 110 (!) 110  Resp: 20   Temp: 97.7 F (36.5 C)   SpO2: 98%    Physical Exam: Cardiac:  RRR, Normal S1,S2. No murmur.  Lungs:  Diminished bilateral bases, normal non-labored breathing, without Rales, rhonchi,  wheezing  Incisions:  NONE Extremities:  with ischemic changes, with Gangrene , with cellulitis; with open wounds; to right great toe.   Abdomen:  Positive bowel sounds throughout, soft non tender and non distended.  Neurologic: AAOX3 answers all questions and follows commands appropriately.    CBC    Component Value Date/Time   WBC 12.1 (H) 11/07/2023 0246   RBC 2.95 (L) 11/07/2023 0246   HGB 8.2 (L) 11/07/2023 0246   HCT 25.8 (L) 11/07/2023 0246   PLT 514 (H) 11/07/2023 0246   MCV 87.5 11/07/2023 0246   MCH 27.8 11/07/2023 0246   MCHC 31.8 11/07/2023 0246   RDW 15.1 11/07/2023 0246   LYMPHSABS 1.6 11/05/2023 1322   MONOABS 0.7 11/05/2023 1322   EOSABS 0.2 11/05/2023 1322   BASOSABS 0.1 11/05/2023 1322    BMET    Component Value Date/Time   NA 141 11/07/2023 0246   NA 143 10/06/2019 1552   NA 138 02/10/2014 1335   K 3.8 11/07/2023 0246   K 4.5 02/10/2014 1335   CL 105 11/07/2023 0246   CL 106 02/10/2014 1335   CO2 28 11/07/2023 0246   CO2 28 02/10/2014 1335   GLUCOSE 104  (H) 11/07/2023 0246   GLUCOSE 99 02/10/2014 1335   BUN 13 11/07/2023 0246   BUN 7 (L) 10/06/2019 1552   BUN 6 (L) 02/10/2014 1335   CREATININE 0.52 11/07/2023 0246   CREATININE 0.89 02/10/2014 1335   CALCIUM 8.5 (L) 11/07/2023 0246   CALCIUM 8.7 02/10/2014 1335   GFRNONAA >60 11/07/2023 0246   GFRNONAA >60 02/10/2014 1335   GFRAA >60 12/18/2019 1012   GFRAA >60 02/10/2014 1335    INR    Component Value Date/Time   INR 1.0 07/05/2023 1141     Intake/Output Summary (Last 24 hours) at 11/07/2023 1111 Last data filed at 11/07/2023 1053 Gross per 24 hour  Intake 480 ml  Output --  Net 480 ml     Assessment/Plan:  77 y.o. female presents to [facility] emergency department with right foot tendon and tendon pain. * No surgery found *  PLAN: Vascular surgery plans on taking the patient to the vascular lab tomorrow 11/08/2023 for right lower extremity angiogram with possible intervention.  Again I discussed the procedure in detail with the patient at the bedside.  She agrees to proceed tomorrow.  Patient was made n.p.o. after midnight for procedure tomorrow.  Plan was discussed with Dr. Festus Barren MD and he agrees with plan.   Marcie Bal [facility] 11/07/2023 11:11 AM

## 2023-11-07 NOTE — Plan of Care (Signed)

## 2023-11-08 ENCOUNTER — Encounter
Admission: EM | Disposition: A | Discharge status: Hospice | Payer: Self-pay | Source: Home / Self Care | Attending: Internal Medicine

## 2023-11-08 DIAGNOSIS — Z9889 Other specified postprocedural states: Secondary | ICD-10-CM

## 2023-11-08 DIAGNOSIS — Z89421 Acquired absence of other right toe(s): Secondary | ICD-10-CM | POA: Diagnosis not present

## 2023-11-08 DIAGNOSIS — I743 Embolism and thrombosis of arteries of the lower extremities: Secondary | ICD-10-CM | POA: Diagnosis not present

## 2023-11-08 DIAGNOSIS — L03031 Cellulitis of right toe: Secondary | ICD-10-CM | POA: Diagnosis not present

## 2023-11-08 DIAGNOSIS — I70261 Atherosclerosis of native arteries of extremities with gangrene, right leg: Secondary | ICD-10-CM | POA: Diagnosis not present

## 2023-11-08 DIAGNOSIS — T82868A Thrombosis of vascular prosthetic devices, implants and grafts, initial encounter: Secondary | ICD-10-CM

## 2023-11-08 HISTORY — PX: LOWER EXTREMITY ANGIOGRAPHY: CATH118251

## 2023-11-08 LAB — BASIC METABOLIC PANEL
Anion gap: 8 (ref 5–15)
BUN: 14 mg/dL (ref 8–23)
CO2: 29 mmol/L (ref 22–32)
Calcium: 8.4 mg/dL — ABNORMAL LOW (ref 8.9–10.3)
Chloride: 102 mmol/L (ref 98–111)
Creatinine, Ser: 0.68 mg/dL (ref 0.44–1.00)
GFR, Estimated: >60 mL/min (ref >60)
Glucose, Bld: 78 mg/dL (ref 70–99)
Potassium: 3.3 mmol/L — ABNORMAL LOW (ref 3.5–5.1)
Sodium: 139 mmol/L (ref 135–145)

## 2023-11-08 LAB — CBC WITH DIFFERENTIAL/PLATELET
Abs Immature Granulocytes: 0.07 10*3/uL (ref 0.00–0.07)
Basophils Absolute: 0 10*3/uL (ref 0.0–0.1)
Basophils Relative: 0 %
Eosinophils Absolute: 0 10*3/uL (ref 0.0–0.5)
Eosinophils Relative: 0 %
HCT: 27 % — ABNORMAL LOW (ref 36.0–46.0)
Hemoglobin: 8.3 g/dL — ABNORMAL LOW (ref 12.0–15.0)
Immature Granulocytes: 1 %
Lymphocytes Relative: 26 %
Lymphs Abs: 2.3 10*3/uL (ref 0.7–4.0)
MCH: 27.3 pg (ref 26.0–34.0)
MCHC: 30.7 g/dL (ref 30.0–36.0)
MCV: 88.8 fL (ref 80.0–100.0)
Monocytes Absolute: 0.9 10*3/uL (ref 0.1–1.0)
Monocytes Relative: 10 %
Neutro Abs: 5.5 10*3/uL (ref 1.7–7.7)
Neutrophils Relative %: 63 %
Platelets: 491 10*3/uL — ABNORMAL HIGH (ref 150–400)
RBC: 3.04 MIL/uL — ABNORMAL LOW (ref 3.87–5.11)
RDW: 15.3 % (ref 11.5–15.5)
WBC: 8.7 10*3/uL (ref 4.0–10.5)
nRBC: 0 % (ref 0.0–0.2)

## 2023-11-08 LAB — GLUCOSE, CAPILLARY: Glucose-Capillary: 178 mg/dL — ABNORMAL HIGH (ref 70–99)

## 2023-11-08 LAB — MRSA NEXT GEN BY PCR, NASAL: MRSA by PCR Next Gen: DETECTED — AB

## 2023-11-08 SURGERY — LOWER EXTREMITY ANGIOGRAPHY
Anesthesia: Moderate Sedation | Laterality: Right

## 2023-11-08 MED ORDER — DIPHENHYDRAMINE HCL 50 MG/ML IJ SOLN: 50.0000 mg | Freq: Once | INTRAMUSCULAR | Status: DC | PRN

## 2023-11-08 MED ORDER — CEFAZOLIN SODIUM-DEXTROSE 2-4 GM/100ML-% IV SOLN
INTRAVENOUS | Status: AC
  Filled 2023-11-08: qty 100

## 2023-11-08 MED ORDER — MIDAZOLAM HCL 2 MG/2ML IJ SOLN
INTRAMUSCULAR | Status: AC
Start: 2023-11-08 — End: ?
  Filled 2023-11-08: qty 2

## 2023-11-08 MED ORDER — HEPARIN (PORCINE) 25000 UT/250ML-% IV SOLN
600.0000 [IU]/h | INTRAVENOUS | Status: DC
  Administered 2023-11-08: 600 [IU]/h via INTRAVENOUS

## 2023-11-08 MED ORDER — HYDROMORPHONE HCL 1 MG/ML IJ SOLN
1.0000 mg | INTRAMUSCULAR | Status: DC | PRN
  Administered 2023-11-08 – 2023-11-09 (×3): 1 mg via INTRAVENOUS
  Filled 2023-11-08 (×3): qty 1

## 2023-11-08 MED ORDER — LIDOCAINE-EPINEPHRINE (PF) 1 %-1:200000 IJ SOLN
INTRAMUSCULAR | Status: DC | PRN
  Administered 2023-11-08: 10 mL via INTRADERMAL

## 2023-11-08 MED ORDER — DEXMEDETOMIDINE HCL IN NACL 400 MCG/100ML IV SOLN
0.0000 ug/kg/h | INTRAVENOUS | Status: DC
Start: 2023-11-08 — End: 2023-11-09
  Administered 2023-11-08: 1.2 ug/kg/h via INTRAVENOUS
  Administered 2023-11-09: 0.8 ug/kg/h via INTRAVENOUS
  Filled 2023-11-08: qty 100

## 2023-11-08 MED ORDER — CEFAZOLIN SODIUM-DEXTROSE 2-4 GM/100ML-% IV SOLN
2.0000 g | INTRAVENOUS | Status: DC
  Administered 2023-11-08: 2 g via INTRAVENOUS

## 2023-11-08 MED ORDER — MIDAZOLAM HCL 2 MG/ML PO SYRP
8.0000 mg | ORAL_SOLUTION | Freq: Once | ORAL | Status: DC | PRN
  Filled 2023-11-08: qty 5

## 2023-11-08 MED ORDER — FENTANYL CITRATE PF 50 MCG/ML IJ SOSY
PREFILLED_SYRINGE | INTRAMUSCULAR | Status: AC
  Filled 2023-11-08: qty 1

## 2023-11-08 MED ORDER — SODIUM CHLORIDE 0.9 % IV SOLN
INTRAVENOUS | Status: DC
Start: 2023-11-08 — End: 2023-11-08

## 2023-11-08 MED ORDER — FAMOTIDINE 20 MG PO TABS: 40.0000 mg | ORAL_TABLET | Freq: Once | ORAL | Status: DC | PRN

## 2023-11-08 MED ORDER — HEPARIN SODIUM (PORCINE) 1000 UNIT/ML IJ SOLN
INTRAMUSCULAR | Status: DC | PRN
  Administered 2023-11-08: 4000 [IU] via INTRAVENOUS

## 2023-11-08 MED ORDER — CHLORHEXIDINE GLUCONATE CLOTH 2 % EX PADS
6.0000 | MEDICATED_PAD | Freq: Every day | CUTANEOUS | Status: DC
  Administered 2023-11-08 – 2023-11-10 (×2): 6 via TOPICAL

## 2023-11-08 MED ORDER — ALTEPLASE 2 MG IJ SOLR
0.5000 mg/h | INTRAMUSCULAR | Status: DC
  Administered 2023-11-09: 0.5 mg/h
  Filled 2023-11-08 (×3): qty 10

## 2023-11-08 MED ORDER — SODIUM CHLORIDE 0.9 % IV SOLN
1.0000 mg/h | INTRAVENOUS | Status: AC
  Administered 2023-11-08: 1 mg/h
  Filled 2023-11-08: qty 10

## 2023-11-08 MED ORDER — HEPARIN (PORCINE) IN NACL 1000-0.9 UT/500ML-% IV SOLN
INTRAVENOUS | Status: DC | PRN
  Administered 2023-11-08: 1000 mL

## 2023-11-08 MED ORDER — MORPHINE SULFATE (PF) 2 MG/ML IV SOLN
INTRAVENOUS | Status: AC
  Filled 2023-11-08: qty 1

## 2023-11-08 MED ORDER — ALTEPLASE 1 MG/ML SYRINGE FOR VASCULAR PROCEDURE
INTRAMUSCULAR | Status: DC | PRN
  Administered 2023-11-08: 8 mg via INTRA_ARTERIAL

## 2023-11-08 MED ORDER — OXYCODONE HCL 5 MG PO TABS
ORAL_TABLET | ORAL | Status: AC
  Filled 2023-11-08: qty 2

## 2023-11-08 MED ORDER — HEPARIN SODIUM (PORCINE) 1000 UNIT/ML IJ SOLN
INTRAMUSCULAR | Status: AC
  Filled 2023-11-08: qty 10

## 2023-11-08 MED ORDER — MIDAZOLAM HCL 2 MG/2ML IJ SOLN
INTRAMUSCULAR | Status: DC | PRN
  Administered 2023-11-08 (×2): 1 mg via INTRAVENOUS

## 2023-11-08 MED ORDER — ALTEPLASE 2 MG IJ SOLR
INTRAMUSCULAR | Status: AC
  Filled 2023-11-08: qty 8

## 2023-11-08 MED ORDER — FENTANYL CITRATE (PF) 100 MCG/2ML IJ SOLN
INTRAMUSCULAR | Status: DC | PRN
  Administered 2023-11-08: 50 ug via INTRAVENOUS
  Administered 2023-11-08 (×2): 25 ug via INTRAVENOUS

## 2023-11-08 MED ORDER — HEPARIN (PORCINE) 25000 UT/250ML-% IV SOLN
INTRAVENOUS | Status: AC
  Filled 2023-11-08: qty 250

## 2023-11-08 MED ORDER — MIDAZOLAM HCL 2 MG/2ML IJ SOLN
INTRAMUSCULAR | Status: AC
  Filled 2023-11-08: qty 2

## 2023-11-08 SURGICAL SUPPLY — 14 items
CANNULA 5F STIFF (CANNULA) IMPLANT
CATH ANGIO 5F PIGTAIL 65CM (CATHETERS) IMPLANT
CATH BEACON 5 .035 65 KMP TIP (CATHETERS) IMPLANT
CATH INFUS 135X50 (CATHETERS) IMPLANT
COVER PROBE ULTRASOUND 5X96 (MISCELLANEOUS) IMPLANT
DEVICE TORQUE (MISCELLANEOUS) IMPLANT
GLIDEWIRE ADV .035X260CM (WIRE) IMPLANT
KIT CV MULTILUMEN 7FR 20 (SET/KITS/TRAYS/PACK) ×1
KIT CV MULTILUMEN 7FR 20 SUB (SET/KITS/TRAYS/PACK) IMPLANT
PACK ANGIOGRAPHY (CUSTOM PROCEDURE TRAY) ×1 IMPLANT
SHEATH BRITE TIP 5FRX11 (SHEATH) IMPLANT
SYR MEDRAD MARK 7 150ML (SYRINGE) IMPLANT
TUBING CONTRAST HIGH PRESS 72 (TUBING) IMPLANT
WIRE GUIDERIGHT .035X150 (WIRE) IMPLANT

## 2023-11-08 NOTE — Progress Notes (Signed)
PT cleansed of incontinent stool and urine and full bed change. Procedural site remains stable

## 2023-11-08 NOTE — Progress Notes (Signed)
1600 Admitted to ICU for Precedex  drip administration. Patient is extremely agitated and yelling due to her hearing. Patient demanding food and ginger ale. When nurse returned with ginger ale patient was asleep.

## 2023-11-08 NOTE — Progress Notes (Signed)
PROGRESS NOTE    [name] [facility]  HYQ:[id number] DOB: [birth year] DOA: 11/05/2023 PCP: [name], MD    Brief Narrative:  77 y.o. female with past medical history significant for CAD, COPD/chronic hypoxic respiratory failure on 3 L nasal cannula baseline, HTN, HLD, hypothyroidism, peripheral vascular disease, anxiety/depression, GERD, esophageal spasm who presented to [facility] ED on 12/16 via EMS from home after being seen by home health nurse with concern of right foot infection.  Home health RN found patient sitting in her feces and urine all night.  Apparently her right great toe has been looking significantly worse with swelling, erythema and drainage over the last few weeks.  Recently discharged from [facility].  Patient also endorses worsening cough, wheezing and dyspnea over the last several days as well.  She admitted to chills but denies fever.  No nausea/vomiting or abdominal pain, no urinary symptoms.   In the ED, temperature 98.0 F, HR 132, RR 23, BP 92/58, SpO2 97% on 3 L nasal cannula.  WBC 11.2, hemoglobin 9.0, platelet count 611.  Sodium 139, potassium 3.5, chloride 103, CO2 26, glucose 103, BUN 11, creatinine 0.59.  AST 16, ALT 12, total bilirubin 0.3.  High sensitive troponin 5.  Lactic acid 1.6.  Chest x-ray with increased interstitial markings/GGO right lower lung field.  Right foot x-ray with soft tissue swelling about the forefoot and first toe, no evidence of osteomyelitis.  Patient was given 2 DuoNebs, 125 mg of IV Solu-Medrol, 4 mg of IV morphine sulfate, 1 mg of p.o. Xanax, 1 L bolus of IV normal saline as well as IV vancomycin and Zosyn. TRH consulted for admission for further evaluation and management.    Assessment & Plan:   Principal Problem:   Cellulitis of great toe of right foot Active Problems:   COPD exacerbation (HCC)   Hypothyroidism   Dyslipidemia   Anxiety and depression   Essential hypertension   Gangrene of toe of right foot (HCC)   Wound  infection Right foot cellulitis Gangrenous changes right great toe Intractable pain Patient presenting from home with progressive swelling, erythema, discoloration and drainage from right great toe.  History of peripheral vascular disease; follows with vascular surgery, Dr. [name] outpatient and podiatry Dr. Allena Katz.  Patient afebrile with slightly elevated WBC count of 11.2.  Right foot x-ray with soft tissue swelling about the forefoot/first toe, no evidence of osteomyelitis. -- Podiatry following, appreciate assistance -- Vascular surgery consulted Plan: Continue broad-spectrum IV antibiotics Vascular surgery s/p lysis catheter today Partial ray amputation with podiatry 12/20 Multimodal pain control Transfer to ICU  Acute COPD exacerbation Chronic hypoxic respiratory failure Patient reporting increasing shortness of breath, wheezing and nonproductive cough.  Chest x-ray with increased interstitial markings/GGO right lower lung fields. Still wheezing on exam Plan: P.o. prednisone 40 mg daily Bronchodilators IV antibiotics as above Mucolytic's and antitussives Supplemental oxygen as needed   Essential hypertension -- Metoprolol tartrate 25 mg p.o. twice daily -- Furosemide 20 mg p.o. daily   Hyperlipidemia Currently not on statin outpatient. -- Check lipid panel   Hypothyroidism -- Levothyroxine 50 mcg p.o. daily   Iron deficiency anemia Hemoglobin 9.5, stable -- Iron polysaccharide 650 mg p.o. daily   Anxiety/depression -- Alprazolam 1 mg p.o. 3 times daily as needed anxiety -- Risperdal 0.25 m p.o. nightly   GERD -- Protonix 40 mg p.o. daily   Insomnia -- Trazodone 300 mg p.o. nightly    DVT prophylaxis: Lovenox Code Status: Full Family Communication: None Disposition  Plan: Status is: Inpatient Remains inpatient appropriate because: Gangrene of toe   Level of care: ICU  Consultants:  Podiatry Vascular surgery  Procedures:   None  Antimicrobials: Vancomycin Zosyn   Subjective: Seen and examined.  Continues to perseverate on pain control  Objective: Vitals:   11/08/23 1158 11/08/23 1213 11/08/23 1328 11/08/23 1343  BP: (!) 146/64 (!) 145/69 (!) 163/71 (!) 144/72  Pulse: 94 (!) 101 90 77  Resp: 18 19 17 16   Temp:      TempSrc:      SpO2: 97% 99% 99% 97%  Weight:      Height:        Intake/Output Summary (Last 24 hours) at 11/08/2023 1611 Last data filed at 11/08/2023 0357 Gross per 24 hour  Intake 314.22 ml  Output --  Net 314.22 ml   Filed Weights   11/06/23 0329 11/06/23 2352  Weight: 52.3 kg 48.9 kg    Examination:  General exam: Appears uncomfortable Respiratory system: Scattered coarse crackles bilaterally.  Normal work of breathing.  2 L Cardiovascular system: S1-S2, RRR, no murmurs, no pedal edema Gastrointestinal system: Soft, NT/ND, normal bowel sounds Central nervous system: Alert, agitated Extremities: Symmetric 5 x 5 power.  Gangrenous changes right great toe Skin: Right great toe black in color Psychiatry: Judgement and insight appear impaired. Mood & affect anxious.     Data Reviewed: I have personally reviewed following labs and imaging studies  CBC: Recent Labs  Lab 11/05/23 1322 11/06/23 0603 11/07/23 0246 11/08/23 0823  WBC 11.2* 10.1 12.1* 8.7  NEUTROABS 8.5*  --   --  5.5  HGB 9.0* 9.5* 8.2* 8.3*  HCT 29.9* 30.6* 25.8* 27.0*  MCV 92.6 91.1 87.5 88.8  PLT 611* 521* 514* 491*   Basic Metabolic Panel: Recent Labs  Lab 11/05/23 1322 11/06/23 0603 11/07/23 0246 11/08/23 0823  NA 139 138 141 139  K 3.5 3.8 3.8 3.3*  CL 103 104 105 102  CO2 26 26 28 29   GLUCOSE 103* 178* 104* 78  BUN 11 13 13 14   CREATININE 0.59 0.68 0.52 0.68  CALCIUM 8.5* 8.3* 8.5* 8.4*   GFR: Estimated Creatinine Clearance: 46.2 mL/min (by C-G formula based on SCr of 0.68 mg/dL). Liver Function Tests: Recent Labs  Lab 11/05/23 1322  AST 16  ALT 12  ALKPHOS 77   BILITOT 0.3  PROT 6.5  ALBUMIN 2.8*   No results for input(s): "LIPASE", "AMYLASE" in the last 168 hours. No results for input(s): "AMMONIA" in the last 168 hours. Coagulation Profile: No results for input(s): "INR", "PROTIME" in the last 168 hours. Cardiac Enzymes: No results for input(s): "CKTOTAL", "CKMB", "CKMBINDEX", "TROPONINI" in the last 168 hours. BNP (last 3 results) No results for input(s): "PROBNP" in the last 8760 hours. HbA1C: No results for input(s): "HGBA1C" in the last 72 hours. CBG: No results for input(s): "GLUCAP" in the last 168 hours. Lipid Profile: Recent Labs    11/06/23 0603  CHOL 138  HDL 51  LDLCALC 80  TRIG 35  CHOLHDL 2.7   Thyroid Function Tests: No results for input(s): "TSH", "T4TOTAL", "FREET4", "T3FREE", "THYROIDAB" in the last 72 hours. Anemia Panel: No results for input(s): "VITAMINB12", "FOLATE", "FERRITIN", "TIBC", "IRON", "RETICCTPCT" in the last 72 hours. Sepsis Labs: Recent Labs  Lab 11/05/23 1322  LATICACIDVEN 1.6    Recent Results (from the past 240 hours)  Culture, blood (routine x 2)     Status: None (Preliminary result)   Collection Time: 11/06/23 12:15 AM  Specimen: BLOOD  Result Value Ref Range Status   Specimen Description BLOOD RIGHT ARM  Final   Special Requests   Final    BOTTLES DRAWN AEROBIC AND ANAEROBIC Blood Culture adequate volume   Culture   Final    NO GROWTH 2 DAYS Performed at [facility], [address], [place], [place] [address]    Report Status PENDING  Incomplete  Culture, blood (routine x 2)     Status: None (Preliminary result)   Collection Time: 11/06/23 12:17 AM   Specimen: BLOOD  Result Value Ref Range Status   Specimen Description BLOOD LEFT ARM  Final   Special Requests   Final    BOTTLES DRAWN AEROBIC AND ANAEROBIC Blood Culture adequate volume   Culture   Final    NO GROWTH 2 DAYS Performed at [facility], [address] [place], [place] [address]     Report Status PENDING  Incomplete  Aerobic/Anaerobic Culture w Gram Stain (surgical/deep wound)     Status: None (Preliminary result)   Collection Time: 11/06/23  1:43 AM   Specimen: Toe  Result Value Ref Range Status   Specimen Description   Final    TOE Performed at [facility], [address], [place], [place] [address]    Special Requests   Final    NONE Performed at [facility], [address] [place], [place] [address]    Gram Stain   Final    NO WBC SEEN FEW GRAM POSITIVE COCCI Performed at [facility], [address], [place], [place] [address]    Culture   Final    ABUNDANT METHICILLIN RESISTANT STAPHYLOCOCCUS AUREUS MODERATE CORYNEBACTERIUM STRIATUM Standardized susceptibility testing for this organism is not available. NO ANAEROBES ISOLATED; CULTURE IN PROGRESS FOR 5 DAYS    Report Status PENDING  Incomplete   Organism ID, Bacteria METHICILLIN RESISTANT STAPHYLOCOCCUS AUREUS  Final      Susceptibility   Methicillin resistant staphylococcus aureus - MIC*    CIPROFLOXACIN >=8 RESISTANT Resistant     ERYTHROMYCIN >=8 RESISTANT Resistant     GENTAMICIN <=0.5 SENSITIVE Sensitive     OXACILLIN >=4 RESISTANT Resistant     TETRACYCLINE <=1 SENSITIVE Sensitive     VANCOMYCIN <=0.5 SENSITIVE Sensitive     TRIMETH/SULFA <=10 SENSITIVE Sensitive     CLINDAMYCIN <=0.25 SENSITIVE Sensitive     RIFAMPIN <=0.5 SENSITIVE Sensitive     Inducible Clindamycin NEGATIVE Sensitive     LINEZOLID 2 SENSITIVE Sensitive     * ABUNDANT METHICILLIN RESISTANT STAPHYLOCOCCUS AUREUS         Radiology Studies: PERIPHERAL VASCULAR CATHETERIZATION Result Date: 11/08/2023 See surgical note for result.       Scheduled Meds:  [MAR Hold] aspirin EC  81 mg Oral Daily   [MAR Hold] feeding supplement  237 mL Oral TID BM   [MAR Hold] fluticasone furoate-vilanterol  1 puff Inhalation Daily   [MAR Hold] furosemide  20 mg Oral Daily   [MAR Hold] guaiFENesin   600 mg Oral BID   [MAR Hold] Ipratropium-Albuterol  1-2 puff Inhalation QID   [MAR Hold] iron polysaccharides  150 mg Oral Daily   [MAR Hold] levothyroxine  50 mcg Oral Q0600   [MAR Hold] metoprolol tartrate  25 mg Oral BID   [MAR Hold] multivitamin with minerals  1 tablet Oral Daily   [MAR Hold] oxyCODONE  10 mg Oral Q12H   [MAR Hold] pantoprazole  40 mg Oral Daily   [MAR  Hold] potassium chloride SA  20 mEq Oral Daily   [MAR Hold] predniSONE  40 mg Oral Q breakfast   [MAR Hold] risperiDONE  0.25 mg Oral QHS   [MAR Hold] traZODone  300 mg Oral QHS   Continuous Infusions:  alteplase (LIMB ISCHEMIA) 10 mg in normal saline (0.02 mg/mL) infusion 0.5 mg/hr (11/08/23 1553)   dexmedetomidine (PRECEDEX) IV infusion     heparin 600 Units/hr (11/08/23 1123)   [MAR Hold] piperacillin-tazobactam (ZOSYN)  IV 3.375 g (11/08/23 0553)   [MAR Hold] vancomycin       LOS: 2 days     Tresa Moore, MD [facility]   If 7PM-7AM, please contact night-coverage  11/08/2023, 4:11 PM

## 2023-11-08 NOTE — Progress Notes (Signed)
Called pharmacy and requested notification when TPA is ready

## 2023-11-08 NOTE — Op Note (Signed)
[facility]  Percutaneous Study/Intervention Procedural Note   Date of Surgery: 11/08/2023  Surgeon(s):Hermie Reagor    Assistants:none  Pre-operative Diagnosis: PAD with gangrene right foot  Post-operative diagnosis:  Same  Procedure(s) Performed:             1.  Ultrasound guidance for vascular access left femoral artery             2.  Catheter placement into right anterior tibial artery from left femoral approach             3.  Aortogram and selective right lower extremity angiogram             4.  Placement of a 135 cm total length 50 cm working length thrombolytic catheter for continuous thrombolytic therapy in the right common femoral artery, superficial femoral artery, popliteal artery, and anterior tibial artery with instillation of 8 mg of tPA and these vessels             5.  Ultrasound guidance for vascular access left femoral vein  6.  Left femoral venous triple-lumen catheter placement             EBL: 5 cc  Contrast: 40 cc  Fluoro Time: 6.7 minutes  Moderate Conscious Sedation Time: approximately 39 minutes using 2 mg of Versed and 100 mcg of Fentanyl              Indications:  Patient is a 77 y.o.female with a long history of extensive peripheral arterial disease and multiple previous interventions who comes in with an ischemic foot and gangrene of her only remaining right toe. The patient is brought in for angiography for further evaluation and potential treatment.  Due to the limb threatening nature of the situation, angiogram was performed for attempted limb salvage. The patient is aware that if the procedure fails, amputation would be expected.  The patient also understands that even with successful revascularization, amputation may still be required due to the severity of the situation.  Risks and benefits are discussed and informed consent is obtained.   Procedure:  The patient was identified and appropriate procedural time out was  performed.  The patient was then placed supine on the table and prepped and draped in the usual sterile fashion. Moderate conscious sedation was administered during a face to face encounter with the patient throughout the procedure with my supervision of the RN administering medicines and monitoring the patient's vital signs, pulse oximetry, telemetry and mental status throughout from the start of the procedure until the patient was taken to the recovery room. Ultrasound was used to evaluate the left common femoral artery.  It was patent .  A digital ultrasound image was acquired.  A Seldinger needle was used to access the left common femoral artery under direct ultrasound guidance and a permanent image was performed.  A 0.035 J wire was advanced without resistance and a 5Fr sheath was placed.  Pigtail catheter was placed into the aorta and an AP aortogram was performed. This demonstrated normal renal arteries and normal aorta and iliac segments without significant stenosis. I then crossed the aortic bifurcation and advanced to the right femoral head. Selective right lower extremity angiogram was then performed. This demonstrated a flush occlusion of the right SFA above the previously placed stents with occlusion of all the stents that were continuous down into the proximal anterior tibial artery.  There really was not any significant anterior tibial artery reconstitution identified but there  was a small posterior tibial artery that reconstituted in the midsegment and was continuous to the foot. It was felt that the only hope of opening this would be continuous thrombolytic therapy to see if an anterior tibial artery opened up after thrombolysis.  A femoral to distal bypass to the posterior tibial artery could be considered, but she ia a poor surgical candidate.  I then got into the occluded SFA with a mild amount of difficulty with a Kumpe catheter and an advantage wire and crossed all the way down into the  anterior tibial artery about 8 to 10 cm below the previously placed stent.  Imaging showed minimal flow in the anterior tibial artery at this point.  I then placed a 135 cm total length 50 cm working length catheter that started in the common femoral artery and ran down through the SFA and popliteal arteries and terminated just below the previously placed stent in the proximal anterior tibial artery.  8 mg of tPA were then delivered in this thrombolytic catheter and then it was secured into place as was the sheath with a silk suture for continuous thrombolytic therapy.  I then placed a central line for venous access and avoid any needle sticks while on tPA.  The left femoral vein was visualized with ultrasound and found to be widely patent.  It was then accessed under direct ultrasound guidance without difficulty with a Seldinger needle.  A J-wire was placed.  After skin nick and dilatation a triple-lumen catheter was placed over the wire and the wire was removed.  All 3 lm withdrew dark red nonpulsatile blood and flushed easily with sterile saline.  It was secured in place with 2 silk sutures.  I elected to terminate the procedure.  The patient was taken to the recovery room in stable condition having tolerated the procedure well.  Findings:               Aortogram:  This demonstrated normal renal arteries and normal aorta and iliac segments without significant stenosis.             Right Lower Extremity:  This demonstrated a flush occlusion of the right SFA above the previously placed stents with occlusion of all the stents that were continuous down into the proximal anterior tibial artery.  There really was not any significant anterior tibial artery reconstitution identified but there was a small posterior tibial artery that reconstituted in the midsegment and was continuous to the foot.   Disposition: Patient was taken to the recovery room in stable condition having tolerated the procedure  well.  Complications: None  Festus Barren 11/08/2023 11:16 AM   This note was created with Dragon Medical transcription system. Any errors in dictation are purely unintentional.

## 2023-11-08 NOTE — Interval H&P Note (Signed)
History and Physical Interval Note:  11/08/2023 10:05 AM  [name]  has presented today for surgery, with the diagnosis of PAD.  The various methods of treatment have been discussed with the patient and family. After consideration of risks, benefits and other options for treatment, the patient has consented to  Procedure(s): Lower Extremity Angiography (Right) as a surgical intervention.  The patient's history has been reviewed, patient examined, no change in status, stable for surgery.  I have reviewed the patient's chart and labs.  Questions were answered to the patient's satisfaction.     Festus Barren

## 2023-11-08 NOTE — Progress Notes (Signed)
Nurse have sent patient money $ 43.00 to security.

## 2023-11-08 NOTE — Progress Notes (Signed)
Dr. Georgeann Oppenheim made aware of pt's grandson's concerns of need of o2 refills at home and request for prescription for portable home condenser.

## 2023-11-08 NOTE — Plan of Care (Signed)

## 2023-11-08 NOTE — Progress Notes (Signed)
Called to assess patient for prn nebulizer. Saturation was 100% on 4lnc, no distress noted and no wheezes noted, prn not given at this time.

## 2023-11-09 ENCOUNTER — Inpatient Hospital Stay: Payer: Medicare HMO | Admitting: Anesthesiology

## 2023-11-09 ENCOUNTER — Encounter: Payer: Self-pay | Admitting: Vascular Surgery

## 2023-11-09 ENCOUNTER — Encounter
Admission: EM | Disposition: A | Discharge status: Hospice | Payer: Self-pay | Source: Home / Self Care | Attending: Internal Medicine

## 2023-11-09 DIAGNOSIS — L03031 Cellulitis of right toe: Secondary | ICD-10-CM | POA: Diagnosis not present

## 2023-11-09 DIAGNOSIS — Z89421 Acquired absence of other right toe(s): Secondary | ICD-10-CM | POA: Diagnosis not present

## 2023-11-09 DIAGNOSIS — I743 Embolism and thrombosis of arteries of the lower extremities: Secondary | ICD-10-CM | POA: Diagnosis not present

## 2023-11-09 DIAGNOSIS — T82868A Thrombosis of vascular prosthetic devices, implants and grafts, initial encounter: Secondary | ICD-10-CM | POA: Diagnosis not present

## 2023-11-09 DIAGNOSIS — I70261 Atherosclerosis of native arteries of extremities with gangrene, right leg: Secondary | ICD-10-CM | POA: Diagnosis not present

## 2023-11-09 HISTORY — PX: LOWER EXTREMITY ANGIOGRAPHY: CATH118251

## 2023-11-09 LAB — BLOOD GAS, ARTERIAL
Acid-Base Excess: 1.3 mmol/L (ref 0.0–2.0)
Bicarbonate: 27.2 mmol/L (ref 20.0–28.0)
O2 Saturation: 93.5 %
Patient temperature: 37
pCO2 arterial: 47 mm[Hg] (ref 32–48)
pH, Arterial: 7.37 (ref 7.35–7.45)
pO2, Arterial: 69 mm[Hg] — ABNORMAL LOW (ref 83–108)

## 2023-11-09 SURGERY — LOWER EXTREMITY ANGIOGRAPHY
Anesthesia: Moderate Sedation | Laterality: Right

## 2023-11-09 SURGERY — AMPUTATION, TOE
Anesthesia: Monitor Anesthesia Care | Site: Toe | Laterality: Right

## 2023-11-09 MED ORDER — NYSTATIN 100000 UNIT/GM EX POWD: Freq: Three times a day (TID) | CUTANEOUS | Status: DC | PRN

## 2023-11-09 MED ORDER — DIPHENHYDRAMINE HCL 50 MG/ML IJ SOLN: 12.5000 mg | INTRAMUSCULAR | Status: DC | PRN

## 2023-11-09 MED ORDER — FENTANYL CITRATE (PF) 100 MCG/2ML IJ SOLN
INTRAMUSCULAR | Status: AC
  Filled 2023-11-09: qty 2

## 2023-11-09 MED ORDER — MORPHINE SULFATE (CONCENTRATE) 10 MG /0.5 ML PO SOLN
5.0000 mg | ORAL | Status: DC | PRN
  Administered 2023-11-10 – 2023-11-11 (×2): 5 mg via ORAL
  Filled 2023-11-09 (×3): qty 0.5

## 2023-11-09 MED ORDER — SODIUM CHLORIDE 0.9 % IV SOLN: INTRAVENOUS | Status: DC

## 2023-11-09 MED ORDER — LIDOCAINE-EPINEPHRINE (PF) 1 %-1:200000 IJ SOLN
INTRAMUSCULAR | Status: DC | PRN
  Administered 2023-11-09: 10 mL via INTRADERMAL

## 2023-11-09 MED ORDER — ALUM & MAG HYDROXIDE-SIMETH 200-200-20 MG/5ML PO SUSP: 30.0000 mL | Freq: Four times a day (QID) | ORAL | Status: DC | PRN

## 2023-11-09 MED ORDER — HEPARIN (PORCINE) 25000 UT/250ML-% IV SOLN: 850.0000 [IU]/h | INTRAVENOUS | Status: DC

## 2023-11-09 MED ORDER — LORAZEPAM 2 MG/ML PO CONC: 1.0000 mg | ORAL | Status: DC | PRN

## 2023-11-09 MED ORDER — HYDROMORPHONE HCL 1 MG/ML IJ SOLN
0.5000 mg | INTRAMUSCULAR | Status: DC | PRN
  Administered 2023-11-09: 1 mg via INTRAVENOUS
  Administered 2023-11-09: 1.5 mg via INTRAVENOUS
  Administered 2023-11-09: 2 mg via INTRAVENOUS
  Administered 2023-11-09: 1.5 mg via INTRAVENOUS
  Administered 2023-11-09: 2 mg via INTRAVENOUS
  Administered 2023-11-09: 1 mg via INTRAVENOUS
  Administered 2023-11-10 – 2023-11-11 (×2): 2 mg via INTRAVENOUS
  Filled 2023-11-09 (×2): qty 1
  Filled 2023-11-09 (×6): qty 2

## 2023-11-09 MED ORDER — HEPARIN (PORCINE) IN NACL 1000-0.9 UT/500ML-% IV SOLN
INTRAVENOUS | Status: DC | PRN
  Administered 2023-11-09: 500 mL

## 2023-11-09 MED ORDER — GLYCOPYRROLATE 0.2 MG/ML IJ SOLN
0.2000 mg | INTRAMUSCULAR | Status: DC | PRN
  Administered 2023-11-11 (×3): 0.2 mg via INTRAVENOUS
  Filled 2023-11-09 (×2): qty 1

## 2023-11-09 MED ORDER — HALOPERIDOL LACTATE 2 MG/ML PO CONC
0.5000 mg | ORAL | Status: DC | PRN
Start: 2023-11-09 — End: 2023-11-11

## 2023-11-09 MED ORDER — LORAZEPAM 1 MG PO TABS
1.0000 mg | ORAL_TABLET | ORAL | Status: DC | PRN
  Administered 2023-11-10 – 2023-11-11 (×2): 1 mg via ORAL
  Filled 2023-11-09 (×3): qty 1

## 2023-11-09 MED ORDER — MIDAZOLAM HCL 2 MG/2ML IJ SOLN
INTRAMUSCULAR | Status: DC | PRN
  Administered 2023-11-09: 1 mg via INTRAVENOUS
  Administered 2023-11-09: 2 mg via INTRAVENOUS

## 2023-11-09 MED ORDER — IOHEXOL 300 MG/ML  SOLN
INTRAMUSCULAR | Status: DC | PRN
  Administered 2023-11-08: 40 mL

## 2023-11-09 MED ORDER — HYDROMORPHONE HCL 1 MG/ML IJ SOLN: 0.5000 mg | Freq: Once | INTRAMUSCULAR | Status: AC

## 2023-11-09 MED ORDER — IODIXANOL 320 MG/ML IV SOLN
INTRAVENOUS | Status: DC | PRN
  Administered 2023-11-09: 25 mL via INTRA_ARTERIAL

## 2023-11-09 MED ORDER — HEPARIN SODIUM (PORCINE) 1000 UNIT/ML IJ SOLN
INTRAMUSCULAR | Status: AC
  Filled 2023-11-09: qty 10

## 2023-11-09 MED ORDER — HALOPERIDOL LACTATE 5 MG/ML IJ SOLN
0.5000 mg | INTRAMUSCULAR | Status: DC | PRN
  Administered 2023-11-09 – 2023-11-11 (×5): 0.5 mg via INTRAVENOUS
  Filled 2023-11-09 (×5): qty 1

## 2023-11-09 MED ORDER — MIDAZOLAM HCL 5 MG/5ML IJ SOLN
INTRAMUSCULAR | Status: AC
  Filled 2023-11-09: qty 5

## 2023-11-09 MED ORDER — GLYCOPYRROLATE 1 MG PO TABS: 1.0000 mg | ORAL_TABLET | ORAL | Status: DC | PRN

## 2023-11-09 MED ORDER — HALOPERIDOL 0.5 MG PO TABS
0.5000 mg | ORAL_TABLET | ORAL | Status: DC | PRN
  Administered 2023-11-10 – 2023-11-11 (×2): 0.5 mg via ORAL
  Filled 2023-11-09 (×2): qty 1

## 2023-11-09 MED ORDER — GLYCOPYRROLATE 0.2 MG/ML IJ SOLN
0.2000 mg | INTRAMUSCULAR | Status: DC | PRN
  Filled 2023-11-09: qty 1

## 2023-11-09 MED ORDER — POLYVINYL ALCOHOL 1.4 % OP SOLN: 1.0000 [drp] | Freq: Four times a day (QID) | OPHTHALMIC | Status: DC | PRN

## 2023-11-09 MED ORDER — MAGIC MOUTHWASH: 15.0000 mL | Freq: Four times a day (QID) | ORAL | Status: DC | PRN

## 2023-11-09 MED ORDER — LORAZEPAM 2 MG/ML IJ SOLN
1.0000 mg | INTRAMUSCULAR | Status: DC | PRN
  Administered 2023-11-09 – 2023-11-11 (×7): 1 mg via INTRAVENOUS
  Filled 2023-11-09 (×7): qty 1

## 2023-11-09 MED ORDER — MORPHINE SULFATE (CONCENTRATE) 10 MG /0.5 ML PO SOLN
5.0000 mg | ORAL | Status: DC | PRN
Start: 2023-11-09 — End: 2023-11-11
  Administered 2023-11-09 – 2023-11-11 (×2): 5 mg via SUBLINGUAL
  Filled 2023-11-09: qty 0.5

## 2023-11-09 MED ORDER — BIOTENE DRY MOUTH MT LIQD: 15.0000 mL | OROMUCOSAL | Status: DC | PRN

## 2023-11-09 MED ORDER — FENTANYL CITRATE (PF) 100 MCG/2ML IJ SOLN
INTRAMUSCULAR | Status: DC | PRN
  Administered 2023-11-09: 25 ug via INTRAVENOUS
  Administered 2023-11-09: 50 ug via INTRAVENOUS

## 2023-11-09 SURGICAL SUPPLY — 5 items
CATH BEACON 5 .038 100 VERT TP (CATHETERS) IMPLANT
DEVICE STARCLOSE SE CLOSURE (Vascular Products) IMPLANT
PACK ANGIOGRAPHY (CUSTOM PROCEDURE TRAY) ×1 IMPLANT
WIRE G V18X300CM (WIRE) IMPLANT
WIRE GUIDERIGHT .035X150 (WIRE) IMPLANT

## 2023-11-09 NOTE — Progress Notes (Signed)
Pt report still has 8/10 right foot pain; no relief at all after last dilaudid dose. Pt also asks for some medicine for anxiety, through her IV. Ativan 1 mg given IV. RT was contacted as patient does have audible wheezing; denies SOB, but states can use a treatment. O2 sats 96-97%. RT states will come give prn duoneb.

## 2023-11-09 NOTE — Progress Notes (Signed)
Pt appears anxious, and HR around 120. Offered po ativan along with scheduled po metoprolol, but pt refused; "does not feel comfortable taking this". Pt was educated regarding type of medication, as well as rationale.

## 2023-11-09 NOTE — Progress Notes (Signed)
Pt repeatedly calling out loud to nurse for pain medication, and then calls again before medicine can be obtained; only wants IV Dilaudid, asks multiple times what type of medicine she is receiving, and unable to retain information. States does not want po meds. Explained to patient rationale to try po meds first but seems not to be able to understand, therefore IV Dilaudid given for 8/10 severity left foot and leg pain as ordered. Pt also very heard of hearing; states she is deaf, so communication is difficult. Appears anxious, begs nurse to stay in the room constantly.

## 2023-11-09 NOTE — TOC Initial Note (Signed)
Transition of Care [facility]) - Initial/Assessment Note    Patient Details  Name: [name] MRN: [id number] Date of Birth: 1946-05-14  Transition [facility] Care [facility]) CM/SW Contact:    Liliana Cline, LCSW Phone Number: 11/09/2023, 4:13 PM  Clinical Narrative:                 CSW spoke with grandson [name]) by phone. Patient is from home alone, she has private pay caretakers daily (hours vary). [name] confirms they would like to pursue hospice services, explained agency options. He chose [facility]. [name] with [facility] to call [place].  Expected Discharge Plan: Hospice Medical Facility Barriers to Discharge: Continued Medical Work up   Patient Goals and CMS Choice   CMS Medicare.gov Compare Post Acute Care list provided to:: Patient Represenative (must comment) Choice offered to / list presented to : Adult Children (grandson [place])      Expected Discharge Plan and Services       Living arrangements for the past 2 months: Single Family Home                                      Prior Living Arrangements/Services Living arrangements for the past 2 months: Single Family Home Lives with:: Self Patient language and need for interpreter reviewed:: Yes Do you feel safe going back to the place where you live?: Yes      Need for Family Participation in Patient Care: Yes (Comment) Care giver support system in place?: Yes (comment) Current home services: Homehealth aide, DME Criminal Activity/Legal Involvement Pertinent to Current Situation/Hospitalization: No - Comment as needed  Activities of Daily Living   ADL Screening (condition at time of admission) Independently performs ADLs?: Yes (appropriate for developmental age) Is the patient deaf or have difficulty hearing?: Yes Does the patient have difficulty seeing, even when wearing glasses/contacts?: No Does the patient have difficulty concentrating, remembering, or making decisions?: No  Permission  Sought/Granted Permission sought to share information with : Facility Industrial/product designer granted to share information with : Yes, Verbal Permission Granted (by grandson Nurse, mental health)     Permission granted to share info w AGENCY: [facility]        Emotional Assessment         Alcohol / Substance Use: Not Applicable Psych Involvement: No (comment)  Admission diagnosis:  Wound infection [T14.8XXA, L08.9] COPD exacerbation (HCC) [J44.1] COPD with acute exacerbation (HCC) [J44.1] Patient Active Problem List   Diagnosis Date Noted   Gangrene of toe of right foot (HCC) 11/07/2023   Wound infection 11/07/2023   Cellulitis of great toe of right foot 11/06/2023   Essential hypertension 11/06/2023   Hyperthyroidism 10/02/2023   SOB (shortness of breath) 10/01/2023   Dementia with behavioral disturbance (HCC) 05/11/2023   COPD (chronic obstructive pulmonary disease) (HCC) 05/09/2023   Sinus tachycardia 02/24/2023   CAD (coronary artery disease) 01/30/2023   Normocytic anemia 01/30/2023   Compression fracture of body of thoracic vertebra (HCC) 01/30/2023   Underweight 11/08/2022   Acute on chronic respiratory failure with hypoxia and hypercapnia (HCC) 06/30/2022   Dyslipidemia 06/30/2022   Subacute osteomyelitis of right foot (HCC)    Hypertension    Protein-calorie malnutrition, severe (HCC) 11/21/2021   COPD with acute exacerbation (HCC) 11/21/2021   COPD exacerbation (HCC) 11/20/2021   Chronic pain - multiple sites arthritis 11/20/2021   Benzodiazepine dependence, continuous (HCC) 11/20/2021   Peripheral vascular disease (HCC)  11/20/2021   Hyponatremia 11/20/2021   Hypocalcemia 11/20/2021   Hypoalbuminemia due to protein-calorie malnutrition (HCC) 11/20/2021   Benzodiazepine withdrawal without complication (HCC)    Malnutrition of moderate degree 10/20/2021   Absent pedal pulses 10/19/2021   Chronic hip pain (Left) 02/17/2021   Hip pain, acute (Left) 02/17/2021    Chronic use of opiate for therapeutic purpose 02/16/2021   Closed fracture of hip, sequela (Left) 02/07/2021   Fracture of femoral neck, left, closed (HCC) 01/11/2021   Chronic respiratory failure with hypoxia (HCC) 01/11/2021   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 (HCC) 12/22/2020   PAD (peripheral artery disease) (HCC) 12/22/2020   Encounter for long-term opiate analgesic use 11/11/2020   Dry gangrene (HCC) of middle toe, right foot 11/11/2020   Foot pain, right 11/11/2020   Hard of hearing 11/11/2020   Atherosclerosis of native arteries of the extremities with gangrene (HCC) 12/09/2019   Vitamin D deficiency 11/12/2019   Pharmacologic therapy 06/04/2019   Disorder of skeletal system 06/04/2019   Problems influencing health status 06/04/2019   Compression fracture of L3 vertebra (HCC) 08/19/2018   Chronic hip pain (Bilateral) 12/31/2017   Neurogenic pain 08/29/2017   Chronic low back pain (1ry area of Pain) (Right) w/o sciatica 08/29/2017   Chronic sacroiliac joint pain (Right) 06/05/2017   Vitamin D insufficiency 03/12/2017   Right hip pain 02/20/2017   Intertrochanteric fracture of right hip, sequela 02/20/2017   B12 deficiency 02/05/2017   Pressure injury of skin 10/02/2016   Closed displaced intertrochanteric fracture of right femur (HCC) 10/02/2016   Hip fracture (HCC) 10/01/2016   Cervical facet hypertrophy (Bilateral) 05/27/2016   History of shoulder surgery 5 (Right) 05/27/2016   Lumbar foraminal stenosis (L3-4) (Left) 05/27/2016   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) 05/27/2016   Lumbar facet hypertrophy (Bilateral) 05/27/2016   Lumbar facet syndrome (Right) 05/27/2016   Lumbar grade 1 Anterolisthesis of L3 over L4 05/27/2016   Chronic shoulder pain (Bilateral) (status post multiple surgeries) (R>L) 12/08/2015   Substance use disorder Risk: High 09/14/2015   Chronic pain syndrome 09/14/2015   Cervical spondylosis 09/14/2015   Chronic neck pain (2ry area  of Pain) (Bilateral) (R>L) 09/14/2015   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) 09/14/2015   Cervical facet syndrome (Bilateral) (R>L) 09/14/2015   Cervical myofascial pain syndrome 09/14/2015   Lumbar spondylosis 09/14/2015   Chronic shoulder impingement syndrome (Right) 09/14/2015   CRP elevated 09/14/2015   Elevated sedimentation rate 09/14/2015   Chronic obstructive pulmonary disease (COPD) (HCC) 09/14/2015   Chronic shoulder pain (Right) 09/14/2015   Abnormal nerve conduction studies 09/14/2015   Encounter for therapeutic drug level monitoring 09/09/2015   Long term current use of opiate analgesic 09/09/2015   Long term prescription opiate use 09/09/2015   Uncomplicated opioid dependence (HCC) 09/09/2015   Opiate use 09/09/2015   Anxiety and depression 07/11/2012   Tobacco use disorder 05/27/2011   Fatigue 05/27/2011   Hypothyroidism 11/25/2010   HLD (hyperlipidemia) 08/24/2010   Tachycardia 08/24/2010   DYSPNEA 08/24/2010   PCP:  [name], MD Pharmacy:   [facility] - [place], [place] - [address] [place] [address] Phone: [phone] Fax: [phone]  [id number] - [place], [place] - [address] [place] [address] Phone: [phone] Fax: [phone]  [id number] - [place], [place] - [address] AT [facility] [address]  [facility] [address] [place] [address] Phone: [phone] Fax: [phone]  [facility] - [facility] [address] [place] [address] Phone: [phone] Fax: [phone]     Social Drivers of Health (SDOH) Social History: SDOH Screenings   Food Insecurity: No Food Insecurity (11/06/2023)  Housing: Unknown (11/06/2023)  Transportation Needs: No Transportation Needs (11/06/2023)  Utilities: Not At Risk (11/06/2023)  Depression (PHQ2-9): Low Risk  (07/13/2021)  Tobacco Use: High  Risk (11/07/2023)   SDOH Interventions:     Readmission Risk Interventions    10/03/2023   10:51 AM 01/31/2023   11:49 AM 07/05/2022   10:50 AM  Readmission Risk Prevention Plan  Transportation Screening Complete Complete Complete  Medication Review Oceanographer) Complete Complete Complete  PCP or Specialist appointment within 3-5 days of discharge  Complete   HRI or Home Care Consult   Complete  SW Recovery Care/Counseling Consult Complete Complete Complete  Palliative Care Screening Not Applicable Not Applicable   Skilled Nursing Facility Complete Not Applicable Not Applicable

## 2023-11-09 NOTE — Progress Notes (Signed)
PHARMACY - ANTICOAGULATION CONSULT NOTE  Pharmacy Consult for heparin infusion Indication: ischemic limb  No Known Allergies  Patient Measurements: Height: 5\' 4"  (162.6 cm) Weight: 48.9 kg (107 lb 12.9 oz) IBW/kg (Calculated) : 54.7 Heparin Dosing Weight: 48.9 kg  Vital Signs: Temp: 98.6 F (37 C) (12/20 0400) Temp Source: Axillary (12/20 0400) BP: 116/79 (12/20 1030) Pulse Rate: 78 (12/20 1030)  Labs: Recent Labs    11/07/23 0246 11/08/23 0823  HGB 8.2* 8.3*  HCT 25.8* 27.0*  PLT 514* 491*  CREATININE 0.52 0.68    Estimated Creatinine Clearance: 46.2 mL/min (by C-G formula based on SCr of 0.68 mg/dL).   Medical History: Past Medical History:  Diagnosis Date   Anxiety    CAD (coronary artery disease)    Carpal tunnel syndrome    Cataract    Complication of anesthesia    has woken up at times   COPD (chronic obstructive pulmonary disease) (HCC)    CRP elevated 09/14/2015   Depression    Difficult intubation    Dyslipidemia    Dyspnea    DOE   Dysrhythmia    hx palpatations   Elevated sedimentation rate 09/14/2015   Esophageal spasm    Gastrointestinal parasites    GERD (gastroesophageal reflux disease)    HCAP (healthcare-associated pneumonia) 10/22/2018   Hiatal hernia    History of peptic ulcer disease    Hypertension    Hyperthyroidism    Hypothyroidism    Low magnesium levels 09/14/2015   Nicotine dependence 09/14/2015   Pelvic fracture (HCC) 2008   fall from riding a horse   Peripheral vascular disease (HCC)    Reflux    Rotator cuff injury    Sepsis (HCC) 10/22/2018   Stenosis, spinal, lumbar     Medications:  Scheduled:   aspirin EC  81 mg Oral Daily   Chlorhexidine Gluconate Cloth  6 each Topical Daily   feeding supplement  237 mL Oral TID BM   fluticasone furoate-vilanterol  1 puff Inhalation Daily   furosemide  20 mg Oral Daily   guaiFENesin  600 mg Oral BID   Ipratropium-Albuterol  1-2 puff Inhalation QID   iron  polysaccharides  150 mg Oral Daily   levothyroxine  50 mcg Oral Q0600   metoprolol tartrate  25 mg Oral BID   multivitamin with minerals  1 tablet Oral Daily   oxyCODONE  10 mg Oral Q12H   pantoprazole  40 mg Oral Daily   potassium chloride SA  20 mEq Oral Daily   predniSONE  40 mg Oral Q breakfast   risperiDONE  0.25 mg Oral QHS   traZODone  300 mg Oral QHS    Assessment:  77 y.o. female w/ PMH of CAD, anxiety, COPD, depression, dyslipidemia, GERD, esophageal spasm, hypertension, thyroid disease and peripheral vascular disease, who presented to the emergency room with acute onset of suspected right big toe wound infection. There is no chronic anticoagulation prior to arrival  Goal of Therapy:  Heparin level 0.3-0.7 units/ml Monitor platelets by anticoagulation protocol: Yes   Plan:  Start heparin infusion at 850 units/hr (avoiding initial bolus ISO recent alteplase administration) Check anti-Xa level in 8 hours and daily while on heparin Continue to monitor H&H and platelets  Lowella Bandy 11/09/2023,11:25 AM

## 2023-11-09 NOTE — Progress Notes (Signed)
Returned from [employer] via bed accompanied by [employer]; handoff received at bedside. NS infusing at 75 cc per hour. Pt was confused on arrival, accusing staff of putting her in the wrong room. Yelling out and trying to sit up; complains of foot pain. Attached to monitoring equipment; pt continuously yelling insults, using profanity and calling nurses "bitches". Left femoral site is CDI, triple lumen catheter intact. No hematoma noted.

## 2023-11-09 NOTE — OR Nursing (Signed)
Transferred from [facility] lab directly to ICU 14. Hand off with angela and Kim. Left groin stable with taped pressure dressing. Pt still confused with complaints of right hip lower back pain.

## 2023-11-09 NOTE — Op Note (Signed)
[facility]  Percutaneous Study/Intervention Procedural Note   Date of Surgery: 11/05/2023 - 11/09/2023  Surgeon(s):Kea Callan    Assistants:none  Pre-operative Diagnosis: PAD with gangrene right foot  Post-operative diagnosis:  Same  Procedure(s) Performed:             1.  Right lower extremity angiogram             2.  Catheter placement into right anterior tibial artery from left femoral approach             3.  StarClose closure device left femoral artery  EBL: 5 cc  Contrast: 25 cc  Fluoro Time: 1 minute  Moderate Conscious Sedation Time: approximately 13 minutes using 3 mg of Versed and 75 mcg of Fentanyl              Indications:  Patient is a 77 y.o.female with profound ischemia of the right lower extremity status post multiple previous interventions and now with gangrene of the right foot and severe pain. The patient has been running thrombolytic therapy overnight in hopes of limb salvage.  The patient is brought in for angiography for further evaluation and potential treatment.  Due to the limb threatening nature of the situation, angiogram was performed for attempted limb salvage. The patient is aware that if the procedure fails, amputation would be expected.  The patient also understands that even with successful revascularization, amputation may still be required due to the severity of the situation. Risks and benefits are discussed and informed consent is obtained.   Procedure:  The patient was identified and appropriate procedural time out was performed.  The patient was then placed supine on the table and prepped and draped in the usual sterile fashion. Moderate conscious sedation was administered during a face to face encounter with the patient throughout the procedure with my supervision of the RN administering medicines and monitoring the patient's vital signs, pulse oximetry, telemetry and mental status throughout from the start of the  procedure until the patient was taken to the recovery room.  Despite significant sedation, the patient was uncooperative and somewhat combative throughout much of the procedure making image quality poor and any attempt at revascularization basically impossible.  The existing lysis catheter was removed over a V18 wire and then a Kumpe catheter was placed first in the anterior tibial artery and then working back to the common femoral artery for imaging.  Selective right lower extremity angiogram was then performed. This demonstrated essentially no runoff with occlusion of the anterior tibial artery.  The stents had cleaned up somewhat with tPA but there remained no flow through them with thrombus up to the common femoral artery.  There was no option for endovascular revascularization with no runoff and an amputation is going to need to be required. I elected to terminate the procedure. The sheath was removed and StarClose closure device was deployed in the left femoral artery with excellent hemostatic result. The patient was taken to the recovery room in stable condition having tolerated the procedure well.  Findings:                            Right lower Extremity:  This demonstrated essentially no runoff with occlusion of the anterior tibial artery.  The stents had cleaned up somewhat with tPA but there remained no flow through them with thrombus up to the common femoral artery.  There was no option for endovascular  revascularization with no runoff and an amputation is going to need to be required.   Disposition: Patient was taken to the recovery room in stable condition having tolerated the procedure well.  Complications: None  Festus Barren 11/09/2023 9:29 AM   This note was created with Dragon Medical transcription system. Any errors in dictation are purely unintentional.

## 2023-11-09 NOTE — Progress Notes (Signed)
PODIATRY PROGRESS NOTE Patient Name: [name] [facility]  DOB 1946-07-21 DOA 11/05/2023  Hospital Day: 5  Assessment:  77 y.o. 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   POD 0 s/p R LE angio with Dr. [name]. No options for revasc and no runoff to right foot  Imaging: OM R hallux on MRi  Plan:  - Discussed case with Dr. [name]. Unfortuantely patient will not heal amputation in the foot level due to severe PAD with 0 vessel runoff to the right foot. Will need proximal limb amputation - Continue to monitor left heel and offload the heel at all times with prevalon boots / float off pillows - Will sign off at this time please reach out with questions/concerns.         Corinna Gab, DPM [facility]    Subjective:  Discussed above with the patient, she is aware of the recommendation for proximal limb amputation. She is concerned she wont be able to walk. Wants dilaudid and asks my opinion of the medication.   Objective:   Vitals:   11/09/23 0934 11/09/23 0939  BP:    Pulse: 96 (!) 0  Resp: (!) 24   Temp:    SpO2:         Latest Ref Rng & Units 11/08/2023    8:23 AM 11/07/2023    2:46 AM 11/06/2023    6:03 AM  CBC  WBC 4.0 - 10.5 K/uL 8.7  12.1  10.1   Hemoglobin 12.0 - 15.0 g/dL 8.3  8.2  9.5   Hematocrit 36.0 - 46.0 % 27.0  25.8  30.6   Platelets 150 - 400 K/uL 491  514  521        Latest Ref Rng & Units 11/08/2023    8:23 AM 11/07/2023    2:46 AM 11/06/2023    6:03 AM  BMP  Glucose 70 - 99 mg/dL 78  829  937   BUN 8 - 23 mg/dL 14  13  13    Creatinine 0.44 - 1.00 mg/dL 1.69  6.78  9.38   Sodium 135 - 145 mmol/L 139  141  138   Potassium 3.5 - 5.1 mmol/L 3.3  3.8  3.8   Chloride 98 - 111 mmol/L 102  105  104   CO2 22 - 32 mmol/L 29  28  26     Calcium 8.9 - 10.3 mg/dL 8.4  8.5  8.3     General: AAOx3, NAD  Lower Extremity Exam Lower Extremity Exam Vasc:     R - PT  non palpable, DP non palpable.                 L - PT non palpable, DP non palpable.    Derm:    R - R hallux gangrenous and necrotic changes.  Superficial pressure ulceration right heel without signs of infection.                L -  Superficial pressure ulcer Left heel, no signs of infection there   MSK:     R - s/p 2nd -5th toe amp at MPJ level. Tender to palpation forefoot and heel.                L -  No gross deformities. Compartments soft, non-tender,  compressible   Neuro:R - Gross sensation diminished. Gross motor function intact                 L - Gross sensation diminished. Gross motor function intact     Radiology:  Results reviewed. See assessment for pertinent imaging results

## 2023-11-09 NOTE — OR Nursing (Signed)
Patient arrived from ICU screaming and yelling I dont want this. Dr [name] notified. Patient sitting upright. Pulse ox 95% on 2 liters, sinus tachycardia. Verbalizing that everyone is out to get her. ICU Nurse at bedside said patient was alert oriented and cooperative at shift change. ABG ordered. Sitting straight up despite repeatedly told to lie down. Intermittant following commands.

## 2023-11-09 NOTE — IPAL (Signed)
  Interdisciplinary Goals of Care Family Meeting   Date carried out: 11/09/2023  Location of the meeting: Bedside  Member's involved: Physician and Family Member or next of kin  Durable Power of Attorney or Environmental health practitioner: Patient  Discussion: We discussed goals of care for [name] .  Patient underwent angiography with vascular surgery. Unfortunately limb with profound and advanced vascular disease with no amenable revascularization targets. Vascular surgery has recommended either AKA or de-escalation of care. I had a lengthy discussion with the patient and her grandson at bedside on 12/20. Explained to her remaining viable options. Family and patient have elected to de-escalate care, proceed with comfort measures, DNR, hospice referral. Care team made aware.   Code status:   Code Status: Do not attempt resuscitation (DNR) - Comfort care   Disposition: Home with Hospice  Time spent for the meeting: 35    Tresa Moore, MD  11/09/2023, 2:37 PM

## 2023-11-09 NOTE — Progress Notes (Signed)
Pt resting comfortably, dozing, during shift handoff at bedside with off going RN. On precedex at 0.8 mcg/kg/hr. Tpa and Zpsyn also infusing as ordered. Left femoral sheath site CDI. When transporter arrived, pt became agitated, yelling, stating "this ain't right, I want to go to surgery". Accused transporter of not taking her to surgery. Refuses to answer questions about orientation, but appears confused. O2 on at 4 LPM, sats 100%. Despite reassurances, pt anxious, agitated. Precedex increased to 0.9, and this RN accompanied transporter with pt to Specials area. Bedside handoff given to [facility]. Pt continues to be agitated yelling, states is having trouble breathing; sats 100%. Precedex increased to 1.0 while in Specials.

## 2023-11-09 NOTE — Progress Notes (Signed)
PROGRESS NOTE    Kathlyne Jakus [facility]  ZOX:[id number] DOB: 1946-07-21 DOA: 11/05/2023 PCP: [name], MD    Brief Narrative:  77 y.o. female with past medical history significant for CAD, COPD/chronic hypoxic respiratory failure on 3 L nasal cannula baseline, HTN, HLD, hypothyroidism, peripheral vascular disease, anxiety/depression, GERD, esophageal spasm who presented to [facility] ED on 12/16 via EMS from home after being seen by home health nurse with concern of right foot infection.  Home health RN found patient sitting in her feces and urine all night.  Apparently her right great toe has been looking significantly worse with swelling, erythema and drainage over the last few weeks.  Recently discharged from [facility].  Patient also endorses worsening cough, wheezing and dyspnea over the last several days as well.  She admitted to chills but denies fever.  No nausea/vomiting or abdominal pain, no urinary symptoms.   In the ED, temperature 98.0 F, HR 132, RR 23, BP 92/58, SpO2 97% on 3 L nasal cannula.  WBC 11.2, hemoglobin 9.0, platelet count 611.  Sodium 139, potassium 3.5, chloride 103, CO2 26, glucose 103, BUN 11, creatinine 0.59.  AST 16, ALT 12, total bilirubin 0.3.  High sensitive troponin 5.  Lactic acid 1.6.  Chest x-ray with increased interstitial markings/GGO right lower lung field.  Right foot x-ray with soft tissue swelling about the forefoot and first toe, no evidence of osteomyelitis.  Patient was given 2 DuoNebs, 125 mg of IV Solu-Medrol, 4 mg of IV morphine sulfate, 1 mg of p.o. Xanax, 1 L bolus of IV normal saline as well as IV vancomycin and Zosyn. TRH consulted for admission for further evaluation and management.   12/20: Patient underwent angiography with vascular surgery.  Unfortunately limb with profound and advanced vascular disease with no amenable revascularization targets.  Vascular surgery has recommended either AKA or de-escalation of care.  I had a lengthy discussion  with the patient and her grandson at bedside on 12/20.  Explained to her remaining viable options.  Family and patient have elected to de-escalate care, proceed with comfort measures, DNR, hospice referral.  Care team made aware.   Assessment & Plan:   Principal Problem:   Cellulitis of great toe of right foot Active Problems:   COPD exacerbation (HCC)   Hypothyroidism   Dyslipidemia   Anxiety and depression   Essential hypertension   Gangrene of toe of right foot (HCC)   Wound infection Right foot cellulitis Gangrenous changes right great toe Intractable pain Patient presenting from home with progressive swelling, erythema, discoloration and drainage from right great toe.  History of peripheral vascular disease; follows with vascular surgery, Dr. [name] outpatient and podiatry Dr. Allena Katz.  Patient afebrile with slightly elevated WBC count of 11.2.  Right foot x-ray with soft tissue swelling about the forefoot/first toe, no evidence of osteomyelitis. -- Podiatry following, appreciate assistance -- Vascular surgery consulted Plan: Patient with no viable revascularization targets.  Vascular surgery only offering AKA at this time.  Patient declining amputation.  Proceeding with comfort measures.  Discontinue all medications not focused on patient comfort.  Discontinue antibiotics.  Focus on pain control.  DNR status.  Hospice referral.   Acute COPD exacerbation Chronic hypoxic respiratory failure Patient reporting increasing shortness of breath, wheezing and nonproductive cough.  Chest x-ray with increased interstitial markings/GGO right lower lung fields. Still wheezing on exam Plan: Patient now comfort measures.  Continue as needed bronchodilators.  Prednisone discontinued.  Supplemental oxygen as needed.  Essential hypertension Furosemide and metoprolol discontinued   Hyperlipidemia    Hypothyroidism Synthroid discontinued   Iron deficiency anemia Iron supplementation  discontinued   Anxiety/depression Continue Risperdal and trazodone.  Continue as needed benzodiazepines   GERD Discontinue PPI   Insomnia Continue nightly trazodone    DVT prophylaxis: Comfort/none Code Status: DNR Family Communication: Grandson at bedside 12/20 Disposition Plan: Status is: Inpatient Remains inpatient appropriate because: Full comfort measures.  Hospice referral in progress   Level of care: Med-Surg  Consultants:  Podiatry Vascular surgery  Procedures:  None  Antimicrobials:   Subjective: Seen and examined.  Frustrated about pain  Objective: Vitals:   11/09/23 1115 11/09/23 1130 11/09/23 1145 11/09/23 1400  BP: 99/65 101/62 102/67 (!) 91/56  Pulse: 97  (!) 123 (!) 113  Resp: 20 (!) 24 (!) 25 (!) 21  Temp:   98.1 F (36.7 C)   TempSrc:   Axillary   SpO2: 100%  (!) 82% 99%  Weight:      Height:        Intake/Output Summary (Last 24 hours) at 11/09/2023 1434 Last data filed at 11/09/2023 1100 Gross per 24 hour  Intake 696.26 ml  Output 1200 ml  Net -503.74 ml   Filed Weights   11/06/23 0329 11/06/23 2352  Weight: 52.3 kg 48.9 kg    Examination:  General exam: Agitated Respiratory system: Scattered coarse crackles bilaterally.  Normal work of breathing.  2 L Cardiovascular system: S1-S2, RRR, no murmurs, no pedal edema Gastrointestinal system: Soft, NT/ND, normal bowel sounds Central nervous system: Alert, agitated Extremities: Symmetric 5 x 5 power.  Gangrenous changes right great toe Skin: Right great toe black in color Psychiatry: Judgement and insight appear impaired. Mood & affect anxious.     Data Reviewed: I have personally reviewed following labs and imaging studies  CBC: Recent Labs  Lab 11/05/23 1322 11/06/23 0603 11/07/23 0246 11/08/23 0823  WBC 11.2* 10.1 12.1* 8.7  NEUTROABS 8.5*  --   --  5.5  HGB 9.0* 9.5* 8.2* 8.3*  HCT 29.9* 30.6* 25.8* 27.0*  MCV 92.6 91.1 87.5 88.8  PLT 611* 521* 514* 491*    Basic Metabolic Panel: Recent Labs  Lab 11/05/23 1322 11/06/23 0603 11/07/23 0246 11/08/23 0823  NA 139 138 141 139  K 3.5 3.8 3.8 3.3*  CL 103 104 105 102  CO2 26 26 28 29   GLUCOSE 103* 178* 104* 78  BUN 11 13 13 14   CREATININE 0.59 0.68 0.52 0.68  CALCIUM 8.5* 8.3* 8.5* 8.4*   GFR: Estimated Creatinine Clearance: 46.2 mL/min (by C-G formula based on SCr of 0.68 mg/dL). Liver Function Tests: Recent Labs  Lab 11/05/23 1322  AST 16  ALT 12  ALKPHOS 77  BILITOT 0.3  PROT 6.5  ALBUMIN 2.8*   No results for input(s): "LIPASE", "AMYLASE" in the last 168 hours. No results for input(s): "AMMONIA" in the last 168 hours. Coagulation Profile: No results for input(s): "INR", "PROTIME" in the last 168 hours. Cardiac Enzymes: No results for input(s): "CKTOTAL", "CKMB", "CKMBINDEX", "TROPONINI" in the last 168 hours. BNP (last 3 results) No results for input(s): "PROBNP" in the last 8760 hours. HbA1C: No results for input(s): "HGBA1C" in the last 72 hours. CBG: Recent Labs  Lab 11/08/23 1729  GLUCAP 178*   Lipid Profile: No results for input(s): "CHOL", "HDL", "LDLCALC", "TRIG", "CHOLHDL", "LDLDIRECT" in the last 72 hours.  Thyroid Function Tests: No results for input(s): "TSH", "T4TOTAL", "FREET4", "T3FREE", "THYROIDAB" in the last 72  hours. Anemia Panel: No results for input(s): "VITAMINB12", "FOLATE", "FERRITIN", "TIBC", "IRON", "RETICCTPCT" in the last 72 hours. Sepsis Labs: Recent Labs  Lab 11/05/23 1322  LATICACIDVEN 1.6    Recent Results (from the past 240 hours)  Culture, blood (routine x 2)     Status: None (Preliminary result)   Collection Time: 11/06/23 12:15 AM   Specimen: BLOOD  Result Value Ref Range Status   Specimen Description BLOOD RIGHT ARM  Final   Special Requests   Final    BOTTLES DRAWN AEROBIC AND ANAEROBIC Blood Culture adequate volume   Culture   Final    NO GROWTH 3 DAYS Performed at [facility], [address]  [place], [place] [address]    Report Status PENDING  Incomplete  Culture, blood (routine x 2)     Status: None (Preliminary result)   Collection Time: 11/06/23 12:17 AM   Specimen: BLOOD  Result Value Ref Range Status   Specimen Description BLOOD LEFT ARM  Final   Special Requests   Final    BOTTLES DRAWN AEROBIC AND ANAEROBIC Blood Culture adequate volume   Culture   Final    NO GROWTH 3 DAYS Performed at [facility], [address], [place], [place] [address]    Report Status PENDING  Incomplete  Aerobic/Anaerobic Culture w Gram Stain (surgical/deep wound)     Status: None (Preliminary result)   Collection Time: 11/06/23  1:43 AM   Specimen: Toe  Result Value Ref Range Status   Specimen Description   Final    TOE Performed at [facility], [address] [place], [place] [address]    Special Requests   Final    NONE Performed at [facility], [address], [place], [place] [address]    Gram Stain   Final    NO WBC SEEN FEW GRAM POSITIVE COCCI Performed at [facility], [address], [place], [place] [address]    Culture   Final    ABUNDANT METHICILLIN RESISTANT STAPHYLOCOCCUS AUREUS MODERATE CORYNEBACTERIUM STRIATUM Standardized susceptibility testing for this organism is not available. NO ANAEROBES ISOLATED; CULTURE IN PROGRESS FOR 5 DAYS    Report Status PENDING  Incomplete   Organism ID, Bacteria METHICILLIN RESISTANT STAPHYLOCOCCUS AUREUS  Final      Susceptibility   Methicillin resistant staphylococcus aureus - MIC*    CIPROFLOXACIN >=8 RESISTANT Resistant     ERYTHROMYCIN >=8 RESISTANT Resistant     GENTAMICIN <=0.5 SENSITIVE Sensitive     OXACILLIN >=4 RESISTANT Resistant     TETRACYCLINE <=1 SENSITIVE Sensitive     VANCOMYCIN <=0.5 SENSITIVE Sensitive     TRIMETH/SULFA <=10 SENSITIVE Sensitive     CLINDAMYCIN <=0.25 SENSITIVE Sensitive     RIFAMPIN <=0.5 SENSITIVE Sensitive     Inducible Clindamycin NEGATIVE Sensitive      LINEZOLID 2 SENSITIVE Sensitive     * ABUNDANT METHICILLIN RESISTANT STAPHYLOCOCCUS AUREUS  MRSA Next Gen by PCR, Nasal     Status: Abnormal   Collection Time: 11/08/23  5:58 PM   Specimen: Nasal Mucosa; Nasal Swab  Result Value Ref Range Status   MRSA by PCR Next Gen DETECTED (A) NOT DETECTED Final    Comment: RESULT CALLED TO, READ BACK BY AND VERIFIED WITH: MARCEL RICHARDS @2041  ON 11/08/23 SKL (NOTE) The GeneXpert MRSA Assay (FDA approved for NASAL specimens only), is one component of a comprehensive MRSA colonization surveillance program. It is not intended to diagnose MRSA infection nor to guide  or monitor treatment for MRSA infections. Test performance is not FDA approved in patients less than 12 years old. Performed at [facility], [address] [place], [place] [address]          Radiology Studies: PERIPHERAL VASCULAR CATHETERIZATION Result Date: 11/09/2023 See surgical note for result.  PERIPHERAL VASCULAR CATHETERIZATION Result Date: 11/08/2023 See surgical note for result.       Scheduled Meds:  Chlorhexidine Gluconate Cloth  6 each Topical Daily   feeding supplement  237 mL Oral TID BM   guaiFENesin  600 mg Oral BID   metoprolol tartrate  25 mg Oral BID   risperiDONE  0.25 mg Oral QHS   traZODone  300 mg Oral QHS   Continuous Infusions:     LOS: 3 days     Tresa Moore, MD [facility]   If 7PM-7AM, please contact night-coverage  11/09/2023, 2:34 PM

## 2023-11-09 NOTE — Progress Notes (Signed)
Pharmacy Antibiotic Note  [name] is a 77 y.o. female w/ PMH of CAD, COPD/chronic hypoxic respiratory failure on 3 L nasal cannula baseline, HTN, HLD, hypothyroidism, peripheral vascular disease, anxiety/depression, GERD, esophageal spasm admitted on 11/05/2023 with gangrene and osteomyelitis.  Pharmacy has been consulted for vancomycin dosing.  Plan: Continue vancomycin to 750mg  IV q 24hrs   Goal AUC 400-550. Expected AUC: 498 Cmin 12.4 SCr used: 0.8 (actual 0.52) 2. Patient also on Zosyn 3.375gm IV q 8hrs (4hr infusion) 3. Will continue to follow cultures and renal function to adjust therapy as needed.  Height: 5\' 4"  (162.6 cm) Weight: 48.9 kg (107 lb 12.9 oz) IBW/kg (Calculated) : 54.7  Temp (24hrs), Avg:98 F (36.7 C), Min:97.5 F (36.4 C), Max:98.6 F (37 C)  Recent Labs  Lab 11/05/23 1322 11/06/23 0603 11/07/23 0246 11/08/23 0823  WBC 11.2* 10.1 12.1* 8.7  CREATININE 0.59 0.68 0.52 0.68  LATICACIDVEN 1.6  --   --   --     Estimated Creatinine Clearance: 46.2 mL/min (by C-G formula based on SCr of 0.68 mg/dL).    No Known Allergies  Antimicrobials this admission: Zosyn 12/17>> vancomycin 12/17>>   Microbiology results:  12/17 GEX:BMWU 12/19  MRSA PCR positive  12/17 Toe  ABUNDANT STAPHYLOCOCCUS AUREUS / MRSA   Thank you for allowing pharmacy to be a part of this patient's care.  Burnis Medin, PharmD, BCPS 11/09/2023 7:22 AM

## 2023-11-09 NOTE — Progress Notes (Signed)
PT Cancellation Note  Patient Details Name: [name] MRN: [id number] DOB: October 29, 1946   Cancelled Treatment:    Reason Eval/Treat Not Completed:  (Order discontinued per care team; transferred to comfort care.  Please re-consult should needs/goals of care change.)   [name] H. Manson Passey, PT, DPT, NCS 11/09/23, 12:40 PM [phone]

## 2023-11-09 NOTE — Progress Notes (Signed)
   11/09/23 1600  Spiritual Encounters  Type of Visit Initial  Care provided to: Patient  Referral source Nurse (RN/NT/LPN)  Reason for visit Religious ritual  OnCall Visit No   Chaplain met with patient and provided compassionate presence, active listening, prayer and empathy.  Patient expressed being happy to have the visit.  Chaplain spiritual support services remain available as the need arises.

## 2023-11-09 NOTE — Interval H&P Note (Signed)
History and Physical Interval Note:  11/09/2023 8:08 AM  [name]  has presented today for surgery, with the diagnosis of PAD.  The various methods of treatment have been discussed with the patient and family. After consideration of risks, benefits and other options for treatment, the patient has consented to  Procedure(s): Lower Extremity Angiography (Right) as a surgical intervention.  The patient's history has been reviewed, patient examined, no change in status, stable for surgery.  I have reviewed the patient's chart and labs.  Questions were answered to the patient's satisfaction.     Festus Barren

## 2023-11-09 NOTE — TOC CM/SW Note (Addendum)
CSW informed by Care Team plan for hospice and to reach out to grandson. Called grandson [place]. Left a VM requesting a return call.  4:10- Attempted another call to grandson [place]. Left another VM requesting another return call, need to determine hospice agency preference so that a referral can be made.  Alfonso Ramus, LCSW Transitions of Care Department [phone]

## 2023-11-10 DIAGNOSIS — L03031 Cellulitis of right toe: Secondary | ICD-10-CM | POA: Diagnosis not present

## 2023-11-10 MED ORDER — FENTANYL 25 MCG/HR TD PT72
1.0000 | MEDICATED_PATCH | TRANSDERMAL | Status: DC
  Administered 2023-11-10: 1 via TRANSDERMAL
  Filled 2023-11-10: qty 1

## 2023-11-10 NOTE — Progress Notes (Signed)
PROGRESS NOTE    Aidia Moczygemba [facility]  XLK:[id number] DOB: 06/10/1946 DOA: 11/05/2023 PCP: [name], MD    Brief Narrative:  77 y.o. female with past medical history significant for CAD, COPD/chronic hypoxic respiratory failure on 3 L nasal cannula baseline, HTN, HLD, hypothyroidism, peripheral vascular disease, anxiety/depression, GERD, esophageal spasm who presented to [facility] ED on 12/16 via EMS from home after being seen by home health nurse with concern of right foot infection.  Home health RN found patient sitting in her feces and urine all night.  Apparently her right great toe has been looking significantly worse with swelling, erythema and drainage over the last few weeks.  Recently discharged from [facility].  Patient also endorses worsening cough, wheezing and dyspnea over the last several days as well.  She admitted to chills but denies fever.  No nausea/vomiting or abdominal pain, no urinary symptoms.   In the ED, temperature 98.0 F, HR 132, RR 23, BP 92/58, SpO2 97% on 3 L nasal cannula.  WBC 11.2, hemoglobin 9.0, platelet count 611.  Sodium 139, potassium 3.5, chloride 103, CO2 26, glucose 103, BUN 11, creatinine 0.59.  AST 16, ALT 12, total bilirubin 0.3.  High sensitive troponin 5.  Lactic acid 1.6.  Chest x-ray with increased interstitial markings/GGO right lower lung field.  Right foot x-ray with soft tissue swelling about the forefoot and first toe, no evidence of osteomyelitis.  Patient was given 2 DuoNebs, 125 mg of IV Solu-Medrol, 4 mg of IV morphine sulfate, 1 mg of p.o. Xanax, 1 L bolus of IV normal saline as well as IV vancomycin and Zosyn. TRH consulted for admission for further evaluation and management.   12/20: Patient underwent angiography with vascular surgery.  Unfortunately limb with profound and advanced vascular disease with no amenable revascularization targets.  Vascular surgery has recommended either AKA or de-escalation of care.  I had a lengthy discussion  with the patient and her grandson at bedside on 12/20.  Explained to her remaining viable options.  Family and patient have elected to de-escalate care, proceed with comfort measures, DNR, hospice referral.  Care team made aware.   Assessment & Plan:   Principal Problem:   Cellulitis of great toe of right foot Active Problems:   COPD exacerbation (HCC)   Hypothyroidism   Dyslipidemia   Anxiety and depression   Essential hypertension   Gangrene of toe of right foot (HCC)   Wound infection  Right foot cellulitis Gangrenous changes right great toe Intractable pain Patient presenting from home with progressive swelling, erythema, discoloration and drainage from right great toe.  History of peripheral vascular disease; follows with vascular surgery, Dr. [name] outpatient and podiatry Dr. Allena Katz.  Patient afebrile with slightly elevated WBC count of 11.2.  Right foot x-ray with soft tissue swelling about the forefoot/first toe, no evidence of osteomyelitis. -- Podiatry following, appreciate assistance -- Vascular surgery consulted Plan: No amputation.  Proceed with full comfort measures and hospice referral.  DNR.  Ensure pain control.  Acute COPD exacerbation Chronic hypoxic respiratory failure Patient reporting increasing shortness of breath, wheezing and nonproductive cough.  Chest x-ray with increased interstitial markings/GGO right lower lung fields. Still wheezing on exam Plan: Patient now comfort measures.  Continue as needed bronchodilators.  Prednisone discontinued.  Supplemental oxygen as needed.   Essential hypertension Furosemide discontinued Metoprolol resumed for rate control   Hyperlipidemia    Hypothyroidism Synthroid discontinued   Iron deficiency anemia Iron supplementation discontinued   Anxiety/depression Continue  Risperdal and trazodone.  Continue as needed benzodiazepines   GERD Discontinue PPI   Insomnia Continue nightly trazodone    DVT  prophylaxis: Comfort/none Code Status: DNR Family Communication: Grandson at bedside 12/20 Disposition Plan: Status is: Inpatient Remains inpatient appropriate because: Full comfort measures.  Hospice referral in progress   Level of care: Med-Surg  Consultants:  Podiatry Vascular surgery  Procedures:  None  Antimicrobials:   Subjective: Seen and examined.  Intermittent delirium this morning.  Objective: Vitals:   11/09/23 2000 11/09/23 2130 11/09/23 2213 11/10/23 0738  BP: 108/73 101/71 (!) 164/90 (!) 171/89  Pulse: (!) 121 (!) 120 (!) 137 (!) 131  Resp: 13  (!) 22 20  Temp: 97.9 F (36.6 C)  98.3 F (36.8 C) 98.3 F (36.8 C)  TempSrc: Oral   Oral  SpO2: 96% 99% 97% 98%  Weight:   49.3 kg   Height:   5\' 4"  (1.626 m)     Intake/Output Summary (Last 24 hours) at 11/10/2023 1214 Last data filed at 11/09/2023 1855 Gross per 24 hour  Intake 240 ml  Output 150 ml  Net 90 ml   Filed Weights   11/06/23 0329 11/06/23 2352 11/09/23 2213  Weight: 52.3 kg 48.9 kg 49.3 kg    Examination:  General exam: Delirious, agitated Respiratory system: Scattered coarse crackles bilaterally.  Tachypneic.  2 L Cardiovascular system: S1-S2, RRR, no murmurs, no pedal edema Gastrointestinal system: Soft, NT/ND, normal bowel sounds Central nervous system: Alert, agitated Extremities: Symmetric 5 x 5 power.  Gangrenous changes right great toe Skin: Right great toe black in color Psychiatry: Judgement and insight appear impaired. Mood & affect anxious.     Data Reviewed: I have personally reviewed following labs and imaging studies  CBC: Recent Labs  Lab 11/05/23 1322 11/06/23 0603 11/07/23 0246 11/08/23 0823  WBC 11.2* 10.1 12.1* 8.7  NEUTROABS 8.5*  --   --  5.5  HGB 9.0* 9.5* 8.2* 8.3*  HCT 29.9* 30.6* 25.8* 27.0*  MCV 92.6 91.1 87.5 88.8  PLT 611* 521* 514* 491*   Basic Metabolic Panel: Recent Labs  Lab 11/05/23 1322 11/06/23 0603 11/07/23 0246 11/08/23 0823   NA 139 138 141 139  K 3.5 3.8 3.8 3.3*  CL 103 104 105 102  CO2 26 26 28 29   GLUCOSE 103* 178* 104* 78  BUN 11 13 13 14   CREATININE 0.59 0.68 0.52 0.68  CALCIUM 8.5* 8.3* 8.5* 8.4*   GFR: Estimated Creatinine Clearance: 46.6 mL/min (by C-G formula based on SCr of 0.68 mg/dL). Liver Function Tests: Recent Labs  Lab 11/05/23 1322  AST 16  ALT 12  ALKPHOS 77  BILITOT 0.3  PROT 6.5  ALBUMIN 2.8*   No results for input(s): "LIPASE", "AMYLASE" in the last 168 hours. No results for input(s): "AMMONIA" in the last 168 hours. Coagulation Profile: No results for input(s): "INR", "PROTIME" in the last 168 hours. Cardiac Enzymes: No results for input(s): "CKTOTAL", "CKMB", "CKMBINDEX", "TROPONINI" in the last 168 hours. BNP (last 3 results) No results for input(s): "PROBNP" in the last 8760 hours. HbA1C: No results for input(s): "HGBA1C" in the last 72 hours. CBG: Recent Labs  Lab 11/08/23 1729  GLUCAP 178*   Lipid Profile: No results for input(s): "CHOL", "HDL", "LDLCALC", "TRIG", "CHOLHDL", "LDLDIRECT" in the last 72 hours.  Thyroid Function Tests: No results for input(s): "TSH", "T4TOTAL", "FREET4", "T3FREE", "THYROIDAB" in the last 72 hours. Anemia Panel: No results for input(s): "VITAMINB12", "FOLATE", "FERRITIN", "TIBC", "IRON", "RETICCTPCT" in  the last 72 hours. Sepsis Labs: Recent Labs  Lab 11/05/23 1322  LATICACIDVEN 1.6    Recent Results (from the past 240 hours)  Culture, blood (routine x 2)     Status: None (Preliminary result)   Collection Time: 11/06/23 12:15 AM   Specimen: BLOOD  Result Value Ref Range Status   Specimen Description BLOOD RIGHT ARM  Final   Special Requests   Final    BOTTLES DRAWN AEROBIC AND ANAEROBIC Blood Culture adequate volume   Culture   Final    NO GROWTH 4 DAYS Performed at [facility], [address] [place], [place] [address]    Report Status PENDING  Incomplete  Culture, blood (routine x 2)     Status: None  (Preliminary result)   Collection Time: 11/06/23 12:17 AM   Specimen: BLOOD  Result Value Ref Range Status   Specimen Description BLOOD LEFT ARM  Final   Special Requests   Final    BOTTLES DRAWN AEROBIC AND ANAEROBIC Blood Culture adequate volume   Culture   Final    NO GROWTH 4 DAYS Performed at [facility], [address] [place], [place] [address]    Report Status PENDING  Incomplete  Aerobic/Anaerobic Culture w Gram Stain (surgical/deep wound)     Status: None (Preliminary result)   Collection Time: 11/06/23  1:43 AM   Specimen: Toe  Result Value Ref Range Status   Specimen Description   Final    TOE Performed at [facility], [address], [place], [place] [address]    Special Requests   Final    NONE Performed at [facility], [address], [place], [place] [address]    Gram Stain   Final    NO WBC SEEN FEW GRAM POSITIVE COCCI Performed at [facility], [address] [place], [place] [address]    Culture   Final    ABUNDANT METHICILLIN RESISTANT STAPHYLOCOCCUS AUREUS MODERATE CORYNEBACTERIUM STRIATUM Standardized susceptibility testing for this organism is not available. NO ANAEROBES ISOLATED; CULTURE IN PROGRESS FOR 5 DAYS    Report Status PENDING  Incomplete   Organism ID, Bacteria METHICILLIN RESISTANT STAPHYLOCOCCUS AUREUS  Final      Susceptibility   Methicillin resistant staphylococcus aureus - MIC*    CIPROFLOXACIN >=8 RESISTANT Resistant     ERYTHROMYCIN >=8 RESISTANT Resistant     GENTAMICIN <=0.5 SENSITIVE Sensitive     OXACILLIN >=4 RESISTANT Resistant     TETRACYCLINE <=1 SENSITIVE Sensitive     VANCOMYCIN <=0.5 SENSITIVE Sensitive     TRIMETH/SULFA <=10 SENSITIVE Sensitive     CLINDAMYCIN <=0.25 SENSITIVE Sensitive     RIFAMPIN <=0.5 SENSITIVE Sensitive     Inducible Clindamycin NEGATIVE Sensitive     LINEZOLID 2 SENSITIVE Sensitive     * ABUNDANT METHICILLIN RESISTANT STAPHYLOCOCCUS AUREUS  MRSA Next  Gen by PCR, Nasal     Status: Abnormal   Collection Time: 11/08/23  5:58 PM   Specimen: Nasal Mucosa; Nasal Swab  Result Value Ref Range Status   MRSA by PCR Next Gen DETECTED (A) NOT DETECTED Final    Comment: RESULT CALLED TO, READ BACK BY AND VERIFIED WITH: MARCEL RICHARDS @2041  ON 11/08/23 SKL (NOTE) The GeneXpert MRSA Assay (FDA approved for NASAL specimens only), is one component of a comprehensive MRSA colonization surveillance program. It is not intended to diagnose MRSA infection nor to guide or monitor treatment for MRSA infections. Test performance is not FDA approved in patients  less than 22 years old. Performed at [facility], [address] [place], [place] [address]          Radiology Studies: PERIPHERAL VASCULAR CATHETERIZATION Result Date: 11/09/2023 See surgical note for result.       Scheduled Meds:  Chlorhexidine Gluconate Cloth  6 each Topical Daily   feeding supplement  237 mL Oral TID BM   fentaNYL  1 patch Transdermal Q72H   guaiFENesin  600 mg Oral BID   metoprolol tartrate  25 mg Oral BID   risperiDONE  0.25 mg Oral QHS   traZODone  300 mg Oral QHS   Continuous Infusions:     LOS: 4 days     Tresa Moore, MD [facility]   If 7PM-7AM, please contact night-coverage  11/10/2023, 12:14 PM

## 2023-11-10 NOTE — Plan of Care (Signed)
  Problem: Clinical Measurements: Goal: Ability to maintain clinical measurements within normal limits will improve Outcome: Progressing Goal: Will remain free from infection Outcome: Progressing Goal: Diagnostic test results will improve Outcome: Progressing Goal: Respiratory complications will improve Outcome: Progressing Goal: Cardiovascular complication will be avoided Outcome: Progressing   Problem: Pain Management: Goal: General experience of comfort will improve Outcome: Progressing   Problem: Safety: Goal: Ability to remain free from injury will improve Outcome: Progressing

## 2023-11-10 NOTE — Plan of Care (Signed)
  Problem: Education: Goal: Knowledge of General Education information will improve Description: Including pain rating scale, medication(s)/side effects and non-pharmacologic comfort measures Outcome: Progressing   Problem: Clinical Measurements: Goal: Ability to maintain clinical measurements within normal limits will improve Outcome: Progressing   Problem: Clinical Measurements: Goal: Ability to maintain clinical measurements within normal limits will improve Outcome: Progressing Goal: Will remain free from infection Outcome: Progressing Goal: Diagnostic test results will improve Outcome: Progressing Goal: Respiratory complications will improve Outcome: Progressing Goal: Cardiovascular complication will be avoided Outcome: Progressing   Problem: Nutrition: Goal: Adequate nutrition will be maintained Outcome: Progressing   Problem: Elimination: Goal: Will not experience complications related to bowel motility Outcome: Progressing Goal: Will not experience complications related to urinary retention Outcome: Progressing   Problem: Pain Management: Goal: General experience of comfort will improve Outcome: Progressing

## 2023-11-10 NOTE — Progress Notes (Signed)
[facility]  Daily Progress Note   Subjective  -   confused  Objective Vitals:   11/09/23 2000 11/09/23 2130 11/09/23 2213 11/10/23 0738  BP: 108/73 101/71 (!) 164/90 (!) 171/89  Pulse: (!) 121 (!) 120 (!) 137 (!) 131  Resp: 13  (!) 22 20  Temp: 97.9 F (36.6 C)  98.3 F (36.8 C) 98.3 F (36.8 C)  TempSrc: Oral   Oral  SpO2: 96% 99% 97% 98%  Weight:   49.3 kg   Height:   5\' 4"  (1.626 m)     Intake/Output Summary (Last 24 hours) at 11/10/2023 0811 Last data filed at 11/09/2023 1855 Gross per 24 hour  Intake 292.56 ml  Output 150 ml  Net 142.56 ml   VASC  L groin: no obvious hematoma or echymosis, pt refused bandage removal; R foot: ischemic, gangrenous changes  Laboratory CBC    Component Value Date/Time   WBC 8.7 11/08/2023 0823   HGB 8.3 (L) 11/08/2023 0823   HCT 27.0 (L) 11/08/2023 0823   PLT 491 (H) 11/08/2023 0823    BMET    Component Value Date/Time   NA 139 11/08/2023 0823   NA 143 10/06/2019 1552   NA 138 02/10/2014 1335   K 3.3 (L) 11/08/2023 0823   K 4.5 02/10/2014 1335   CL 102 11/08/2023 0823   CL 106 02/10/2014 1335   CO2 29 11/08/2023 0823   CO2 28 02/10/2014 1335   GLUCOSE 78 11/08/2023 0823   GLUCOSE 99 02/10/2014 1335   BUN 14 11/08/2023 0823   BUN 7 (L) 10/06/2019 1552   BUN 6 (L) 02/10/2014 1335   CREATININE 0.68 11/08/2023 0823   CREATININE 0.89 02/10/2014 1335   CALCIUM 8.4 (L) 11/08/2023 0823   CALCIUM 8.7 02/10/2014 1335   GFRNONAA >60 11/08/2023 0823   GFRNONAA >60 02/10/2014 1335   GFRAA >60 12/18/2019 1012   GFRAA >60 02/10/2014 1335    Assessment/Planning: POD #1 s/p Dx R leg angio  Pt confused and somewhat combative this AM No salvage option Will likely need RIGHT AKA Pt has some understanding but her LOC appears to wax and wane Paint RIGHT foot with betadine Continue abx Will defer Dr. [name] timing of any amputation  Leonides Sake, MD, FACS, FSVS  Covering for [facility]   11/10/2023, 8:11 AM

## 2023-11-10 NOTE — Plan of Care (Signed)
  Problem: Education: Goal: Knowledge of General Education information will improve Description: Including pain rating scale, medication(s)/side effects and non-pharmacologic comfort measures Outcome: Not Progressing   Problem: Health Behavior/Discharge Planning: Goal: Ability to manage health-related needs will improve Outcome: Not Progressing   Problem: Clinical Measurements: Goal: Ability to maintain clinical measurements within normal limits will improve Outcome: Progressing   Problem: Nutrition: Goal: Adequate nutrition will be maintained Outcome: Progressing   Problem: Coping: Goal: Level of anxiety will decrease Outcome: Not Progressing   Problem: Pain Management: Goal: General experience of comfort will improve Outcome: Not Progressing   Problem: Safety: Goal: Ability to remain free from injury will improve Outcome: Not Progressing

## 2023-11-10 NOTE — Progress Notes (Signed)
[facility] LIAISON NOTE   Received request from [facility], Transitions of Care Manager, for hospice services at home after discharge. Spoke with patient at bedside.  She is hard of hearing but I was able to have a short conversation regarding her wishes for discharge.  She states she wants to go home and thinks hospice is a good idea.  Patient's grandson, Roland Earl is not present.  I called and left a message and received no call back.  Patient has received a lot of medications for agitation, restlessness and pain.  Will wait for son to contact me to review options.  I have spoken with Dr. Gibson Ramp, hospice MD regarding patient's status and amount of meds she is requiring.  She has been approved for GIP LOC- but need to speak with grandson.    Above information shared with Rodney Langton, Transitions of Care Manager and hospital medical team.  Please call with any hospice related questions or concerns.  Thank you for the opportunity to participate in this patient's care.   Norris Cross, RN Nurse Liaison [phone]

## 2023-11-11 DIAGNOSIS — L03031 Cellulitis of right toe: Secondary | ICD-10-CM | POA: Diagnosis not present

## 2023-11-11 LAB — CULTURE, BLOOD (ROUTINE X 2)
Culture: NO GROWTH
Culture: NO GROWTH
Special Requests: ADEQUATE
Special Requests: ADEQUATE

## 2023-11-11 LAB — AEROBIC/ANAEROBIC CULTURE W GRAM STAIN (SURGICAL/DEEP WOUND): Gram Stain: NONE SEEN

## 2023-11-11 SURGERY — AMPUTATION, TOE
Anesthesia: Monitor Anesthesia Care | Site: Toe | Laterality: Right

## 2023-11-11 MED ORDER — ONDANSETRON HCL 4 MG PO TABS: 4.0000 mg | ORAL_TABLET | Freq: Four times a day (QID) | ORAL | Status: AC | PRN

## 2023-11-11 MED ORDER — HALOPERIDOL 0.5 MG PO TABS: 0.5000 mg | ORAL_TABLET | ORAL | Status: AC | PRN

## 2023-11-11 MED ORDER — HYDROMORPHONE HCL 1 MG/ML IJ SOLN: 0.5000 mg | INTRAMUSCULAR | Status: AC | PRN

## 2023-11-11 MED ORDER — HALOPERIDOL LACTATE 5 MG/ML IJ SOLN
1.0000 mg | Freq: Once | INTRAMUSCULAR | Status: AC
  Administered 2023-11-11: 1 mg via INTRAVENOUS
  Filled 2023-11-11: qty 1

## 2023-11-11 MED ORDER — HYDROCOD POLI-CHLORPHE POLI ER 10-8 MG/5ML PO SUER: 5.0000 mL | Freq: Two times a day (BID) | ORAL | Status: AC | PRN

## 2023-11-11 MED ORDER — GUAIFENESIN ER 600 MG PO TB12: 600.0000 mg | ORAL_TABLET | Freq: Two times a day (BID) | ORAL | Status: AC

## 2023-11-11 MED ORDER — GLYCOPYRROLATE 1 MG PO TABS: 1.0000 mg | ORAL_TABLET | ORAL | Status: AC | PRN

## 2023-11-11 MED ORDER — OXYCODONE HCL 5 MG PO TABS: 5.0000 mg | ORAL_TABLET | ORAL | Status: AC | PRN

## 2023-11-11 MED ORDER — ACETAMINOPHEN 325 MG PO TABS: 650.0000 mg | ORAL_TABLET | Freq: Four times a day (QID) | ORAL | Status: AC | PRN

## 2023-11-11 MED ORDER — DIPHENHYDRAMINE HCL 50 MG/ML IJ SOLN: 12.5000 mg | INTRAMUSCULAR | Status: AC | PRN

## 2023-11-11 MED ORDER — LORAZEPAM 2 MG/ML IJ SOLN
1.0000 mg | Freq: Once | INTRAMUSCULAR | Status: AC
  Administered 2023-11-11: 1 mg via INTRAVENOUS
  Filled 2023-11-11: qty 1

## 2023-11-11 MED ORDER — MAGIC MOUTHWASH: 15.0000 mL | Freq: Four times a day (QID) | ORAL | Status: AC | PRN

## 2023-11-11 MED ORDER — ALUM & MAG HYDROXIDE-SIMETH 200-200-20 MG/5ML PO SUSP: 30.0000 mL | Freq: Four times a day (QID) | ORAL | Status: AC | PRN

## 2023-11-11 MED ORDER — MAGNESIUM HYDROXIDE 400 MG/5ML PO SUSP: 30.0000 mL | Freq: Every day | ORAL | Status: AC | PRN

## 2023-11-11 MED ORDER — MORPHINE SULFATE (CONCENTRATE) 10 MG /0.5 ML PO SOLN: 5.0000 mg | ORAL | Status: AC | PRN

## 2023-11-11 MED ORDER — BIOTENE DRY MOUTH MT LIQD: 15.0000 mL | OROMUCOSAL | Status: AC | PRN

## 2023-11-11 MED ORDER — POLYVINYL ALCOHOL 1.4 % OP SOLN: 1.0000 [drp] | Freq: Four times a day (QID) | OPHTHALMIC | Status: AC | PRN

## 2023-11-11 MED ORDER — METOPROLOL TARTRATE 25 MG PO TABS: 25.0000 mg | ORAL_TABLET | Freq: Two times a day (BID) | ORAL | Status: AC

## 2023-11-11 MED ORDER — NYSTATIN 100000 UNIT/GM EX POWD: Freq: Three times a day (TID) | CUTANEOUS | Status: AC | PRN

## 2023-11-11 MED ORDER — LORAZEPAM 1 MG PO TABS: 1.0000 mg | ORAL_TABLET | ORAL | Status: AC | PRN

## 2023-11-11 MED ORDER — POLYETHYLENE GLYCOL 3350 17 G PO PACK: 17.0000 g | PACK | Freq: Every day | ORAL | Status: AC | PRN

## 2023-11-11 MED ORDER — FENTANYL 25 MCG/HR TD PT72: 1.0000 | MEDICATED_PATCH | TRANSDERMAL | Status: AC

## 2023-11-11 NOTE — Progress Notes (Addendum)
Spoke to [place], hospice liaison regarding safety regarding patient's goal to return home and current symptom management needs. Diannia Ruder to reach out to grandson regarding DC goals.    1655 Report called into [facility], Report given to [employer]

## 2023-11-11 NOTE — Plan of Care (Signed)
Consult noted for GOC. Notes reviewed. 12/20 IPAL GOC note in place to transition to comfort care with hospice referral. Epic chat with attending 12/22; goals continue for comfort care, and for hospice to work with family for dispo. As per chat, PMT will D/C consult. Please reconsult if needs arise.

## 2023-11-11 NOTE — Progress Notes (Signed)
Progress Note   Patient: [name] NFA:[id number] DOB: 1946-05-31 DOA: 11/05/2023     5 DOS: the patient was seen and examined on 11/11/2023   Brief hospital course:  "77 y.o. female with past medical history significant for CAD, COPD/chronic hypoxic respiratory failure on 3 L nasal cannula baseline, HTN, HLD, hypothyroidism, peripheral vascular disease, anxiety/depression, GERD, esophageal spasm who presented to [facility] ED on 12/16 via EMS from home after being seen by home health nurse with concern of right foot infection.  Home health RN found patient sitting in her feces and urine all night.  Apparently her right great toe has been looking significantly worse with swelling, erythema and drainage over the last few weeks.  Recently discharged from [facility].  Patient also endorses worsening cough, wheezing and dyspnea over the last several days as well.  She admitted to chills but denies fever.  No nausea/vomiting or abdominal pain, no urinary symptoms.   In the ED, temperature 98.0 F, HR 132, RR 23, BP 92/58, SpO2 97% on 3 L nasal cannula.  WBC 11.2, hemoglobin 9.0, platelet count 611.  Sodium 139, potassium 3.5, chloride 103, CO2 26, glucose 103, BUN 11, creatinine 0.59.  AST 16, ALT 12, total bilirubin 0.3.  High sensitive troponin 5.  Lactic acid 1.6.  Chest x-ray with increased interstitial markings/GGO right lower lung field.  Right foot x-ray with soft tissue swelling about the forefoot and first toe, no evidence of osteomyelitis.  Patient was given 2 DuoNebs, 125 mg of IV Solu-Medrol, 4 mg of IV morphine sulfate, 1 mg of p.o. Xanax, 1 L bolus of IV normal saline as well as IV vancomycin and Zosyn. TRH consulted for admission for further evaluation and management.    12/20: Patient underwent angiography with vascular surgery.  Unfortunately limb with profound and advanced vascular disease with no amenable revascularization targets.  Vascular surgery has recommended either AKA or  de-escalation of care.  I had a lengthy discussion with the patient and her grandson at bedside on 12/20.  Explained to her remaining viable options.  Family and patient have elected to de-escalate care, proceed with comfort measures, DNR, hospice referral.  Care team made aware."  12/22: hospice liaison to meet with pt & grandson for discharge planning for home with hospice.  Hospital bed will need to be delivered to the home prior to discharge.   Assessment and Plan:  Right foot cellulitis Gangrenous changes right great toe Intractable pain Patient presenting from home with progressive swelling, erythema, discoloration and drainage from right great toe.  History of peripheral vascular disease; follows with vascular surgery, Dr. [name] outpatient and podiatry Dr. Allena Katz.  Patient afebrile with slightly elevated WBC count of 11.2.  Right foot x-ray with soft tissue swelling about the forefoot/first toe, no evidence of osteomyelitis. -- Podiatry following, appreciate assistance -- Vascular surgery consulted Plan: No amputation.   Now on full comfort measures  Hospice liaison following  DNR.   Ensure pain control. Notify provider if any uncontrolled pain, discomfort, anxiety, restlessness etc.   Acute COPD exacerbation Chronic hypoxic respiratory failure Patient reporting increasing shortness of breath, wheezing and nonproductive cough.  Chest x-ray with increased interstitial markings/GGO right lower lung fields. Still wheezing on exam Plan: Patient now comfort measures.  Continue as needed bronchodilators.  Prednisone discontinued.  Supplemental oxygen as needed.   Essential hypertension Furosemide discontinued Metoprolol resumed for rate control   Hyperlipidemia     Hypothyroidism Synthroid discontinued   Iron deficiency anemia  Iron supplementation discontinued   Anxiety/depression Continue Risperdal and trazodone.  Continue as needed benzodiazepines   GERD Discontinue PPI    Insomnia Continue nightly trazodone        Subjective: Pt seen with RN and grandson present this AM.  Pt's pain is better controlled this AM, per RN who's had pt previously.  Pt does not express complaints, but is minimally verbal.  Pt appears fairly comfortable.  Grandson asks to speak to hospice about planning for d/c home with hospice services.   Physical Exam: Vitals:   11/10/23 2044 11/10/23 2105 11/11/23 0511 11/11/23 0822  BP: (!) 171/114 (!) 147/76 119/66 117/77  Pulse: (!) 127 (!) 123 (!) 107   Resp: 19  17 16   Temp: (!) 97.3 F (36.3 C)  (!) 97.5 F (36.4 C) 98.5 F (36.9 C)  TempSrc:      SpO2: 100%  94% 99%  Weight:      Height:       General exam: awake, alert, no acute distress, frail and chronically ill appearing HEENT: dry mucus membranes, hearing grossly normal  Respiratory system: CTAB with referred upper airway secretion sounds, no wheezes, normal respiratory effort. Cardiovascular system: normal S1/S2,  RRR, no pedal edema.   Gastrointestinal system: soft, NT, ND, no HSM felt, +bowel sounds. Central nervous system: no gross focal neurologic deficits, normal speech Extremities: , no edema, normal tone Skin: dry, intact, normal temperature Psychiatry: normal mood, congruent affect   Data Reviewed:  No new labs -- pt is comfort care.  Family Communication: [name] at bedside on rounds this AM  Disposition: Status is: Inpatient Remains inpatient appropriate because: plans underway for discharge home with hospice. Requires comfort care inpatient until DME delivered to home & hospice able to start services.   Planned Discharge Destination:  Home with hospice    Time spent: 42 minutes  Author: Pennie Banter, DO 11/11/2023 12:14 PM  For on call review [email or web address].

## 2023-11-11 NOTE — Progress Notes (Signed)
[facility] Liaison Note  Follow up with Florene Glen who accepts GIP bed at hospice Inpatient Unit.   Consents being arranged for [facility] to sign.  RN can call report to the hospice home at [phone]. Please ensure signed DNR is on the chart.  Please medicate Ms. Egle for comfort prior to EMS transport as needed.  This RN will arrange EMS transport once consents are signed and the hospital team is ready.  Communication to hospital medical team and family of above information.  Norris Cross, RN Nurse Liaison [phone]

## 2023-11-11 NOTE — Progress Notes (Signed)
Money retrieved from security and walked to EMS bay to give to EMS crew for transport. Sealed envelope provided to EMS transport crew to be placed in patient belongings.

## 2023-11-11 NOTE — Progress Notes (Signed)
Security called to retrieve patient's money locked in lock box for d/c to hospice facility.

## 2023-11-11 NOTE — Progress Notes (Signed)
Spoke with dietary team member who was making sure patient's money was successfully transported to security by 1A staff. No security paper noted on chart. Spoke with April with security who confirmed patient did have something in lock box. Will attempt to find security paperwork and key and place on chart.

## 2023-11-11 NOTE — Discharge Summary (Signed)
Physician Discharge Summary   Patient: [name] MRN: [id number] DOB: Aug 06, 1946  Admit date:     11/05/2023  Discharge date: 11/11/23  Discharge Physician: Pennie Banter   PCP: [name], MD   Recommendations at discharge:    Follow up with hospice team on arrival  Discharge Diagnoses: Principal Problem:   Cellulitis of great toe of right foot Active Problems:   COPD exacerbation (HCC)   Hypothyroidism   Dyslipidemia   Anxiety and depression   Essential hypertension   Gangrene of toe of right foot (HCC)   Wound infection  Resolved Problems:   * No resolved hospital problems. *  Hospital Course:  "77 y.o. female with past medical history significant for CAD, COPD/chronic hypoxic respiratory failure on 3 L nasal cannula baseline, HTN, HLD, hypothyroidism, peripheral vascular disease, anxiety/depression, GERD, esophageal spasm who presented to [facility] ED on 12/16 via EMS from home after being seen by home health nurse with concern of right foot infection.  Home health RN found patient sitting in her feces and urine all night.  Apparently her right great toe has been looking significantly worse with swelling, erythema and drainage over the last few weeks.  Recently discharged from [facility].  Patient also endorses worsening cough, wheezing and dyspnea over the last several days as well.  She admitted to chills but denies fever.  No nausea/vomiting or abdominal pain, no urinary symptoms.   In the ED, temperature 98.0 F, HR 132, RR 23, BP 92/58, SpO2 97% on 3 L nasal cannula.  WBC 11.2, hemoglobin 9.0, platelet count 611.  Sodium 139, potassium 3.5, chloride 103, CO2 26, glucose 103, BUN 11, creatinine 0.59.  AST 16, ALT 12, total bilirubin 0.3.  High sensitive troponin 5.  Lactic acid 1.6.  Chest x-ray with increased interstitial markings/GGO right lower lung field.  Right foot x-ray with soft tissue swelling about the forefoot and first toe, no evidence of  osteomyelitis.  Patient was given 2 DuoNebs, 125 mg of IV Solu-Medrol, 4 mg of IV morphine sulfate, 1 mg of p.o. Xanax, 1 L bolus of IV normal saline as well as IV vancomycin and Zosyn. TRH consulted for admission for further evaluation and management.    12/20: Patient underwent angiography with vascular surgery.  Unfortunately limb with profound and advanced vascular disease with no amenable revascularization targets.  Vascular surgery has recommended either AKA or de-escalation of care.  I had a lengthy discussion with the patient and her grandson at bedside on 12/20.  Explained to her remaining viable options.  Family and patient have elected to de-escalate care, proceed with comfort measures, DNR, hospice referral.  Care team made aware."   12/22: hospice liaison to meet with pt & grandson for discharge planning for home with hospice.  Hospital bed will need to be delivered to the home prior to discharge.   12/22 PM -- notified that pt is eligible for inpatient hospice, and they are able to accept pt today.  Consent obtained from family.  Pt is medically stable for d/c and transport to hospice facility this evening.   Assessment and Plan:  Right foot cellulitis Gangrenous changes right great toe Intractable pain Patient presenting from home with progressive swelling, erythema, discoloration and drainage from right great toe.  History of peripheral vascular disease; follows with vascular surgery, Dr. [name] outpatient and podiatry Dr. Allena Katz.  Patient afebrile with slightly elevated WBC count of 11.2.  Right foot x-ray with soft tissue swelling about  the forefoot/first toe, no evidence of osteomyelitis. -- Podiatry following, appreciate assistance -- Vascular surgery consulted Plan: No amputation.   Now on full comfort measures  Hospice liaison following  DNR.   Ensure pain control. Notify provider if any uncontrolled pain, discomfort, anxiety, restlessness etc.   Acute COPD  exacerbation Chronic hypoxic respiratory failure Patient reporting increasing shortness of breath, wheezing and nonproductive cough.  Chest x-ray with increased interstitial markings/GGO right lower lung fields. Still wheezing on exam Plan: Patient now comfort measures.  Continue as needed bronchodilators.  Prednisone discontinued.  Supplemental oxygen as needed.   Essential hypertension Furosemide discontinued Metoprolol resumed for rate control   Hyperlipidemia     Hypothyroidism Synthroid discontinued   Iron deficiency anemia Iron supplementation discontinued   Anxiety/depression Continue Risperdal and trazodone.  Continue as needed benzodiazepines   GERD Discontinue PPI   Insomnia Continue nightly trazodone         Consultants: Palliative Care Procedures performed: as above  Disposition: Hospice care Diet recommendation:  Discharge Diet Orders (From admission, onward)     Start     Ordered   11/11/23 0000  Diet - low sodium heart healthy        11/11/23 1643            DISCHARGE MEDICATION: Allergies as of 11/11/2023   No Known Allergies      Medication List     STOP taking these medications    albuterol 108 (90 Base) MCG/ACT inhaler Commonly known as: VENTOLIN HFA   ALPRAZolam 1 MG tablet Commonly known as: XANAX   amLODipine 5 MG tablet Commonly known as: NORVASC   ammonium lactate 12 % lotion Commonly known as: LAC-HYDRIN   Aspirin Low Dose 81 MG tablet Generic drug: aspirin EC   atorvastatin 10 MG tablet Commonly known as: Lipitor   benzonatate 100 MG capsule Commonly known as: TESSALON   Combivent Respimat 20-100 MCG/ACT Aers respimat Generic drug: Ipratropium-Albuterol   feeding supplement Liqd   fluticasone-salmeterol 250-50 MCG/ACT Aepb Commonly known as: ADVAIR   furosemide 20 MG tablet Commonly known as: LASIX   hydrOXYzine 25 MG tablet Commonly known as: ATARAX   ipratropium-albuterol 0.5-2.5 (3) MG/3ML  Soln Commonly known as: DUONEB   iron polysaccharides 150 MG capsule Commonly known as: NIFEREX   levothyroxine 50 MCG tablet Commonly known as: SYNTHROID   melatonin 5 MG Tabs   multivitamin with minerals Tabs tablet   omeprazole 40 MG capsule Commonly known as: PRILOSEC   oxyCODONE-acetaminophen 5-325 MG tablet Commonly known as: PERCOCET/ROXICET   potassium chloride SA 20 MEQ tablet Commonly known as: KLOR-CON M   risperiDONE 0.25 MG tablet Commonly known as: RISPERDAL   thiamine 100 MG tablet Commonly known as: Vitamin B-1   traZODone 150 MG tablet Commonly known as: DESYREL       TAKE these medications    acetaminophen 325 MG tablet Commonly known as: TYLENOL Take 2 tablets (650 mg total) by mouth every 6 (six) hours as needed for mild pain (pain score 1-3) (or Fever >/= 101).   alum & mag hydroxide-simeth 200-200-20 MG/5ML suspension Commonly known as: MAALOX/MYLANTA Take 30 mLs by mouth every 6 (six) hours as needed for indigestion or heartburn (dyspepsia).   antiseptic oral rinse Liqd Apply 15 mLs topically as needed for dry mouth.   chlorpheniramine-HYDROcodone 10-8 MG/5ML Commonly known as: TUSSIONEX Take 5 mLs by mouth every 12 (twelve) hours as needed for cough.   diphenhydrAMINE 50 MG/ML injection Commonly known as: BENADRYL  Inject 0.25 mLs (12.5 mg total) into the vein every 4 (four) hours as needed for itching.   fentaNYL 25 MCG/HR Commonly known as: DURAGESIC Place 1 patch onto the skin every 3 (three) days. Start taking on: November 13, 2023   glycopyrrolate 1 MG tablet Commonly known as: ROBINUL Take 1 tablet (1 mg total) by mouth every 4 (four) hours as needed (excessive secretions).   guaiFENesin 600 MG 12 hr tablet Commonly known as: MUCINEX Take 1 tablet (600 mg total) by mouth 2 (two) times daily.   haloperidol 0.5 MG tablet Commonly known as: HALDOL Take 1 tablet (0.5 mg total) by mouth every 4 (four) hours as needed for  agitation (or delirium).   HYDROmorphone 1 MG/ML injection Commonly known as: DILAUDID Inject 0.5-2 mLs (0.5-2 mg total) into the vein every 30 (thirty) minutes as needed for severe pain (pain score 7-10) (To alleviate signs and symptoms of distress).   LORazepam 1 MG tablet Commonly known as: ATIVAN Take 1 tablet (1 mg total) by mouth every 4 (four) hours as needed for anxiety.   magic mouthwash Soln Take 15 mLs by mouth every 6 (six) hours as needed for mouth pain (mouth pain / discomfort). Suspension contains equal amounts of Maalox Extra Strength, nystatin, and diphenhydramine.   magnesium hydroxide 400 MG/5ML suspension Commonly known as: MILK OF MAGNESIA Take 30 mLs by mouth daily as needed for mild constipation.   metoprolol tartrate 25 MG tablet Commonly known as: LOPRESSOR Take 1 tablet (25 mg total) by mouth 2 (two) times daily. What changed: additional instructions   morphine CONCENTRATE 10 mg / 0.5 ml concentrated solution Take 0.25 mLs (5 mg total) by mouth every 2 (two) hours as needed for moderate pain (pain score 4-6) (or dyspnea).   nystatin powder Commonly known as: MYCOSTATIN/NYSTOP Apply topically 3 (three) times daily as needed (affected skin).   ondansetron 4 MG tablet Commonly known as: ZOFRAN Take 1 tablet (4 mg total) by mouth every 6 (six) hours as needed for nausea.   oxyCODONE 5 MG immediate release tablet Commonly known as: Oxy IR/ROXICODONE Take 1-2 tablets (5-10 mg total) by mouth every 4 (four) hours as needed for severe pain (pain score 7-10) or moderate pain (pain score 4-6).   polyethylene glycol 17 g packet Commonly known as: MIRALAX / GLYCOLAX Take 17 g by mouth daily as needed for moderate constipation. What changed: reasons to take this   polyvinyl alcohol 1.4 % ophthalmic solution Commonly known as: LIQUIFILM TEARS Place 1 drop into both eyes 4 (four) times daily as needed for dry eyes.        Discharge Exam: Filed Weights    11/06/23 0329 11/06/23 2352 11/09/23 2213  Weight: 52.3 kg 48.9 kg 49.3 kg   Physical exam as documented in today's Progress Note Unchanged.  Condition at discharge: stable  The results of significant diagnostics from this hospitalization (including imaging, microbiology, ancillary and laboratory) are listed below for reference.   Imaging Studies: PERIPHERAL VASCULAR CATHETERIZATION Result Date: 11/09/2023 See surgical note for result.  PERIPHERAL VASCULAR CATHETERIZATION Result Date: 11/08/2023 See surgical note for result.  DG Foot 2 Views Right Result Date: 11/06/2023 CLINICAL DATA:  Wound on right foot. 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 Demineralization. No evidence of acute fracture. No dislocation. No evidence of osteomyelitis. Soft tissue swelling about the forefoot and first toe. IMPRESSION: Soft tissue swelling about the forefoot and first  toe. No evidence of osteomyelitis. Electronically Signed   By: Minerva Fester M.D.   On: 11/06/2023 02:06   DG Chest Port 1 View Result Date: 11/06/2023 CLINICAL DATA:  Shortness of breath EXAM: PORTABLE CHEST 1 VIEW COMPARISON:  10/01/2023 FINDINGS: Stable cardiomediastinal silhouette. Aortic atherosclerotic calcification. Hyperinflation and chronic bronchitic change. Increased interstitial markings and ground-glass opacities in the right lower lung compared to 10/01/2023. This may be projectional or due to infection superimposed on emphysema. No pleural effusion or pneumothorax. Cervical spine fusion. IMPRESSION: Increased interstitial markings and ground-glass opacities in the right lower lung compared to 10/01/2023. This may be due to confluence of shadows or infection superimposed on emphysema. Electronically Signed   By: Minerva Fester M.D.   On: 11/06/2023 02:04    Microbiology: Results for orders placed or performed during the hospital encounter of 11/05/23   Culture, blood (routine x 2)     Status: None   Collection Time: 11/06/23 12:15 AM   Specimen: BLOOD  Result Value Ref Range Status   Specimen Description BLOOD RIGHT ARM  Final   Special Requests   Final    BOTTLES DRAWN AEROBIC AND ANAEROBIC Blood Culture adequate volume   Culture   Final    NO GROWTH 5 DAYS Performed at [facility], [address], [place], [place] [address]    Report Status 11/11/2023 FINAL  Final  Culture, blood (routine x 2)     Status: None   Collection Time: 11/06/23 12:17 AM   Specimen: BLOOD  Result Value Ref Range Status   Specimen Description BLOOD LEFT ARM  Final   Special Requests   Final    BOTTLES DRAWN AEROBIC AND ANAEROBIC Blood Culture adequate volume   Culture   Final    NO GROWTH 5 DAYS Performed at [facility], [address] [place], [place] [address]    Report Status 11/11/2023 FINAL  Final  Aerobic/Anaerobic Culture w Gram Stain (surgical/deep wound)     Status: None   Collection Time: 11/06/23  1:43 AM   Specimen: Toe  Result Value Ref Range Status   Specimen Description   Final    TOE Performed at [facility], [address], [place], [place] [address]    Special Requests   Final    NONE Performed at [facility], [address] [place], [place] [address]    Gram Stain NO WBC SEEN FEW GRAM POSITIVE COCCI   Final   Culture   Final    ABUNDANT METHICILLIN RESISTANT STAPHYLOCOCCUS AUREUS MODERATE CORYNEBACTERIUM STRIATUM Standardized susceptibility testing for this organism is not available. NO ANAEROBES ISOLATED Performed at [facility], [address], [place], [place] [address]    Report Status 11/11/2023 FINAL  Final   Organism ID, Bacteria METHICILLIN RESISTANT STAPHYLOCOCCUS AUREUS  Final      Susceptibility   Methicillin resistant staphylococcus aureus - MIC*    CIPROFLOXACIN >=8 RESISTANT Resistant     ERYTHROMYCIN >=8 RESISTANT Resistant     GENTAMICIN <=0.5  SENSITIVE Sensitive     OXACILLIN >=4 RESISTANT Resistant     TETRACYCLINE <=1 SENSITIVE Sensitive     VANCOMYCIN <=0.5 SENSITIVE Sensitive     TRIMETH/SULFA <=10 SENSITIVE Sensitive     CLINDAMYCIN <=0.25 SENSITIVE Sensitive     RIFAMPIN <=0.5 SENSITIVE Sensitive     Inducible Clindamycin NEGATIVE Sensitive     LINEZOLID 2 SENSITIVE Sensitive     * ABUNDANT METHICILLIN RESISTANT STAPHYLOCOCCUS AUREUS  MRSA Next  Gen by PCR, Nasal     Status: Abnormal   Collection Time: 11/08/23  5:58 PM   Specimen: Nasal Mucosa; Nasal Swab  Result Value Ref Range Status   MRSA by PCR Next Gen DETECTED (A) NOT DETECTED Final    Comment: RESULT CALLED TO, READ BACK BY AND VERIFIED WITH: MARCEL RICHARDS @2041  ON 11/08/23 SKL (NOTE) The GeneXpert MRSA Assay (FDA approved for NASAL specimens only), is one component of a comprehensive MRSA colonization surveillance program. It is not intended to diagnose MRSA infection nor to guide or monitor treatment for MRSA infections. Test performance is not FDA approved in patients less than 52 years old. Performed at [facility], [address] [place], [place] [address]     Labs: CBC: Recent Labs  Lab 11/05/23 1322 11/06/23 0603 11/07/23 0246 11/08/23 0823  WBC 11.2* 10.1 12.1* 8.7  NEUTROABS 8.5*  --   --  5.5  HGB 9.0* 9.5* 8.2* 8.3*  HCT 29.9* 30.6* 25.8* 27.0*  MCV 92.6 91.1 87.5 88.8  PLT 611* 521* 514* 491*   Basic Metabolic Panel: Recent Labs  Lab 11/05/23 1322 11/06/23 0603 11/07/23 0246 11/08/23 0823  NA 139 138 141 139  K 3.5 3.8 3.8 3.3*  CL 103 104 105 102  CO2 26 26 28 29   GLUCOSE 103* 178* 104* 78  BUN 11 13 13 14   CREATININE 0.59 0.68 0.52 0.68  CALCIUM 8.5* 8.3* 8.5* 8.4*   Liver Function Tests: Recent Labs  Lab 11/05/23 1322  AST 16  ALT 12  ALKPHOS 77  BILITOT 0.3  PROT 6.5  ALBUMIN 2.8*   CBG: Recent Labs  Lab 11/08/23 1729  GLUCAP 178*    Discharge time spent: less than 30  minutes.  Signed: Pennie Banter, DO [facility] 11/11/2023

## 2023-11-21 DEATH — deceased

## 2023-11-27 ENCOUNTER — Encounter (INDEPENDENT_AMBULATORY_CARE_PROVIDER_SITE_OTHER): Payer: Medicare HMO | Admitting: Vascular Surgery

## 2023-11-27 ENCOUNTER — Encounter (INDEPENDENT_AMBULATORY_CARE_PROVIDER_SITE_OTHER): Payer: Medicare HMO

## 2023-12-03 ENCOUNTER — Encounter (INDEPENDENT_AMBULATORY_CARE_PROVIDER_SITE_OTHER): Payer: Self-pay | Admitting: Vascular Surgery

## 2024-01-03 ENCOUNTER — Encounter (INDEPENDENT_AMBULATORY_CARE_PROVIDER_SITE_OTHER): Payer: Self-pay

## 2024-04-08 ENCOUNTER — Encounter (INDEPENDENT_AMBULATORY_CARE_PROVIDER_SITE_OTHER): Payer: Self-pay
